# Patient Record
Sex: Male | Born: 1950 | Race: White | Hispanic: No | Smoking: Current every day smoker
Health system: Southern US, Community
[De-identification: ages and names within clinical notes are randomized; demographics above are authoritative.]

## PROBLEM LIST (undated history)

## (undated) DIAGNOSIS — R609 Edema, unspecified: Secondary | ICD-10-CM

## (undated) DIAGNOSIS — G8929 Other chronic pain: Secondary | ICD-10-CM

## (undated) DIAGNOSIS — M4800 Spinal stenosis, site unspecified: Secondary | ICD-10-CM

## (undated) DIAGNOSIS — E7800 Pure hypercholesterolemia, unspecified: Secondary | ICD-10-CM

## (undated) DIAGNOSIS — D509 Iron deficiency anemia, unspecified: Secondary | ICD-10-CM

## (undated) DIAGNOSIS — U071 COVID-19: Secondary | ICD-10-CM

## (undated) DIAGNOSIS — I5030 Unspecified diastolic (congestive) heart failure: Secondary | ICD-10-CM

## (undated) DIAGNOSIS — E1142 Type 2 diabetes mellitus with diabetic polyneuropathy: Secondary | ICD-10-CM

## (undated) DIAGNOSIS — K219 Gastro-esophageal reflux disease without esophagitis: Secondary | ICD-10-CM

## (undated) DIAGNOSIS — N289 Disorder of kidney and ureter, unspecified: Secondary | ICD-10-CM

## (undated) DIAGNOSIS — I739 Peripheral vascular disease, unspecified: Secondary | ICD-10-CM

## (undated) DIAGNOSIS — F329 Major depressive disorder, single episode, unspecified: Secondary | ICD-10-CM

## (undated) DIAGNOSIS — F32A Depression, unspecified: Secondary | ICD-10-CM

## (undated) DIAGNOSIS — R251 Tremor, unspecified: Secondary | ICD-10-CM

## (undated) DIAGNOSIS — I10 Essential (primary) hypertension: Secondary | ICD-10-CM

## (undated) DIAGNOSIS — J449 Chronic obstructive pulmonary disease, unspecified: Secondary | ICD-10-CM

## (undated) DIAGNOSIS — F419 Anxiety disorder, unspecified: Secondary | ICD-10-CM

## (undated) DIAGNOSIS — E119 Type 2 diabetes mellitus without complications: Secondary | ICD-10-CM

## (undated) DIAGNOSIS — R600 Localized edema: Secondary | ICD-10-CM

## (undated) DIAGNOSIS — K264 Chronic or unspecified duodenal ulcer with hemorrhage: Secondary | ICD-10-CM

## (undated) DIAGNOSIS — I639 Cerebral infarction, unspecified: Secondary | ICD-10-CM

## (undated) DIAGNOSIS — D649 Anemia, unspecified: Secondary | ICD-10-CM

## (undated) DIAGNOSIS — I82409 Acute embolism and thrombosis of unspecified deep veins of unspecified lower extremity: Secondary | ICD-10-CM

## (undated) DIAGNOSIS — M869 Osteomyelitis, unspecified: Secondary | ICD-10-CM

## (undated) HISTORY — PX: ANKLE SURGERY: SHX546

## (undated) HISTORY — DX: Major depressive disorder, single episode, unspecified: F32.9

## (undated) HISTORY — DX: Depression, unspecified: F32.A

---

## 2000-11-29 ENCOUNTER — Encounter: Payer: Self-pay | Admitting: Internal Medicine

## 2000-11-29 ENCOUNTER — Ambulatory Visit (HOSPITAL_COMMUNITY): Admission: RE | Admit: 2000-11-29 | Discharge: 2000-11-29 | Payer: Self-pay | Admitting: Internal Medicine

## 2002-04-13 ENCOUNTER — Encounter: Payer: Self-pay | Admitting: Emergency Medicine

## 2002-04-13 ENCOUNTER — Emergency Department (HOSPITAL_COMMUNITY): Admission: EM | Admit: 2002-04-13 | Discharge: 2002-04-13 | Payer: Self-pay | Admitting: Emergency Medicine

## 2003-05-21 ENCOUNTER — Emergency Department (HOSPITAL_COMMUNITY): Admission: EM | Admit: 2003-05-21 | Discharge: 2003-05-21 | Payer: Self-pay | Admitting: Emergency Medicine

## 2003-05-22 ENCOUNTER — Encounter (HOSPITAL_COMMUNITY): Admission: RE | Admit: 2003-05-22 | Discharge: 2003-06-21 | Payer: Self-pay | Admitting: Internal Medicine

## 2003-12-13 ENCOUNTER — Ambulatory Visit (HOSPITAL_COMMUNITY): Admission: RE | Admit: 2003-12-13 | Discharge: 2003-12-13 | Payer: Self-pay | Admitting: Internal Medicine

## 2004-01-05 ENCOUNTER — Observation Stay (HOSPITAL_COMMUNITY): Admission: RE | Admit: 2004-01-05 | Discharge: 2004-01-06 | Payer: Self-pay | Admitting: General Surgery

## 2004-01-05 HISTORY — PX: HERNIA REPAIR: SHX51

## 2004-01-24 ENCOUNTER — Ambulatory Visit (HOSPITAL_COMMUNITY): Admission: RE | Admit: 2004-01-24 | Discharge: 2004-01-24 | Payer: Self-pay | Admitting: Internal Medicine

## 2006-01-07 ENCOUNTER — Emergency Department (HOSPITAL_COMMUNITY): Admission: EM | Admit: 2006-01-07 | Discharge: 2006-01-07 | Payer: Self-pay | Admitting: Emergency Medicine

## 2007-11-04 ENCOUNTER — Emergency Department (HOSPITAL_COMMUNITY): Admission: EM | Admit: 2007-11-04 | Discharge: 2007-11-04 | Payer: Self-pay | Admitting: Emergency Medicine

## 2008-12-18 ENCOUNTER — Emergency Department (HOSPITAL_COMMUNITY): Admission: EM | Admit: 2008-12-18 | Discharge: 2008-12-18 | Payer: Self-pay | Admitting: Emergency Medicine

## 2009-08-24 ENCOUNTER — Emergency Department (HOSPITAL_COMMUNITY): Admission: EM | Admit: 2009-08-24 | Discharge: 2009-08-24 | Payer: Self-pay | Admitting: Emergency Medicine

## 2010-07-20 ENCOUNTER — Emergency Department (HOSPITAL_COMMUNITY)
Admission: EM | Admit: 2010-07-20 | Discharge: 2010-07-20 | Disposition: A | Discharge status: Home / Self Care | Payer: Medicare Other | Attending: Emergency Medicine | Admitting: Emergency Medicine

## 2010-07-20 ENCOUNTER — Encounter (HOSPITAL_COMMUNITY): Payer: Self-pay | Admitting: Radiology

## 2010-07-20 ENCOUNTER — Emergency Department (HOSPITAL_COMMUNITY): Payer: Medicare Other

## 2010-07-20 DIAGNOSIS — Z79899 Other long term (current) drug therapy: Secondary | ICD-10-CM | POA: Insufficient documentation

## 2010-07-20 DIAGNOSIS — R109 Unspecified abdominal pain: Secondary | ICD-10-CM | POA: Insufficient documentation

## 2010-07-20 DIAGNOSIS — I10 Essential (primary) hypertension: Secondary | ICD-10-CM | POA: Insufficient documentation

## 2010-07-20 DIAGNOSIS — Z87442 Personal history of urinary calculi: Secondary | ICD-10-CM | POA: Insufficient documentation

## 2010-07-20 DIAGNOSIS — R911 Solitary pulmonary nodule: Secondary | ICD-10-CM | POA: Insufficient documentation

## 2010-07-20 DIAGNOSIS — E119 Type 2 diabetes mellitus without complications: Secondary | ICD-10-CM | POA: Insufficient documentation

## 2010-07-20 HISTORY — DX: Essential (primary) hypertension: I10

## 2010-07-20 LAB — COMPREHENSIVE METABOLIC PANEL
ALT: 9 U/L (ref 0–53)
AST: 14 U/L (ref 0–37)
Alkaline Phosphatase: 59 U/L (ref 39–117)
CO2: 29 mEq/L (ref 19–32)
Calcium: 8.4 mg/dL (ref 8.4–10.5)
GFR calc Af Amer: >60 mL/min (ref >60)
GFR calc non Af Amer: >60 mL/min (ref >60)
Potassium: 3.2 mEq/L — ABNORMAL LOW (ref 3.5–5.1)
Sodium: 142 mEq/L (ref 135–145)

## 2010-07-20 LAB — CBC
HCT: 40.4 % (ref 39.0–52.0)
Hemoglobin: 13.6 g/dL (ref 13.0–17.0)
MCHC: 33.7 g/dL (ref 30.0–36.0)
RBC: 4.24 MIL/uL (ref 4.22–5.81)
WBC: 9.1 10*3/uL (ref 4.0–10.5)

## 2010-07-20 LAB — DIFFERENTIAL
Basophils Absolute: 0 10*3/uL (ref 0.0–0.1)
Basophils Relative: 0 % (ref 0–1)
Lymphocytes Relative: 30 % (ref 12–46)
Monocytes Absolute: 0.4 10*3/uL (ref 0.1–1.0)
Neutro Abs: 5.9 10*3/uL (ref 1.7–7.7)
Neutrophils Relative %: 64 % (ref 43–77)

## 2010-07-20 LAB — GLUCOSE, CAPILLARY: Glucose-Capillary: 53 mg/dL — ABNORMAL LOW (ref 70–99)

## 2010-07-20 LAB — URINALYSIS, ROUTINE W REFLEX MICROSCOPIC
Bilirubin Urine: NEGATIVE
Hgb urine dipstick: NEGATIVE
Specific Gravity, Urine: >1.03 — ABNORMAL HIGH (ref 1.005–1.030)
Urine Glucose, Fasting: NEGATIVE mg/dL
pH: 5.5 (ref 5.0–8.0)

## 2010-07-20 MED ORDER — IOHEXOL 300 MG/ML  SOLN
100.0000 mL | Freq: Once | INTRAMUSCULAR | Status: AC | PRN
  Administered 2010-07-20: 100 mL via INTRAVENOUS

## 2010-08-09 ENCOUNTER — Ambulatory Visit (HOSPITAL_COMMUNITY)
Admission: RE | Admit: 2010-08-09 | Discharge: 2010-08-09 | Disposition: A | Discharge status: Home / Self Care | Payer: Medicare Other | Source: Ambulatory Visit | Attending: Urology | Admitting: Urology

## 2010-08-09 ENCOUNTER — Ambulatory Visit (HOSPITAL_COMMUNITY): Payer: Medicare Other

## 2010-08-09 DIAGNOSIS — Z79899 Other long term (current) drug therapy: Secondary | ICD-10-CM | POA: Insufficient documentation

## 2010-08-09 DIAGNOSIS — I10 Essential (primary) hypertension: Secondary | ICD-10-CM | POA: Insufficient documentation

## 2010-08-09 DIAGNOSIS — E119 Type 2 diabetes mellitus without complications: Secondary | ICD-10-CM | POA: Insufficient documentation

## 2010-08-09 DIAGNOSIS — N135 Crossing vessel and stricture of ureter without hydronephrosis: Secondary | ICD-10-CM

## 2010-08-09 LAB — GLUCOSE, CAPILLARY

## 2010-08-10 LAB — GLUCOSE, CAPILLARY: Glucose-Capillary: 85 mg/dL (ref 70–99)

## 2010-08-10 LAB — POCT I-STAT 4, (NA,K, GLUC, HGB,HCT)
Potassium: 4 mEq/L (ref 3.5–5.1)
Sodium: 144 mEq/L (ref 135–145)

## 2010-08-11 NOTE — Op Note (Signed)
  NAME:  Eric Scott, Eric Scott                ACCOUNT NO.:  [id number]  MEDICAL RECORD NO.:  [id number]           PATIENT TYPE:  O  LOCATION:  DAYP                          FACILITY:  [facility]  PHYSICIAN:  Marissa Nestle, M.D.DATE OF BIRTH:  [birth year]  DATE OF PROCEDURE: DATE OF DISCHARGE:                              OPERATIVE REPORT   PREOPERATIVE DIAGNOSIS:  Rule out left ureteral dilatation, possible stricture or stone.  POSTOPERATIVE DIAGNOSIS:  Normal left retrograde pyelogram.  ANESTHESIA:  General.  PROCEDURE:  The patient is having considerable pain in the left flank, which was suspecting like a renal colic.  CT scan shows segmented dilation of the ureter on the left side, suspicion of a stricture. Patient has a history of having kidney stones in the past, but this CT does not show any kidney stone.  He was given general endotracheal anesthesia in lithotomy position, usual prep and drape, #25 cystoscope introduced into the bladder.  Bladder looks normal.  Left ureteral orifice catheterized with a wedge catheter.  Hypaque is injected under fluoroscopic control.  The dye goes up into the renal pelvis.  The entire ureter looks normal.  There is just a slight dilation of ureter at the crossing of the common iliac vessel, but there is no obstruction. Dye goes through that area very easily.  The renal pelvis is not dilated.  The calyces are club shaped.  I do not see any filling defect in the ureter or the renal pelvis, and at this point the cystoscope was removed and the patient left the operating room in satisfactory condition.     Marissa Nestle, M.D.     MIJ/MEDQ  D:  08/09/2010  T:  08/10/2010  Job:  [id number]  Electronically Signed by Beaulah Dinning M.D. on 08/10/2010 02:15:44 PM

## 2010-08-25 LAB — URIC ACID: Uric Acid, Serum: 11.5 mg/dL — ABNORMAL HIGH (ref 4.0–7.8)

## 2010-08-25 LAB — POCT CARDIAC MARKERS
CKMB, poc: <1 ng/mL — ABNORMAL LOW (ref 1.0–8.0)
Myoglobin, poc: 99.1 ng/mL (ref 12–200)

## 2010-10-20 NOTE — Op Note (Signed)
Eric Scott, Eric Scott                          ACCOUNT NO.:  [id number]   MEDICAL RECORD NO.:  [id number]                   PATIENT TYPE:  AMB   LOCATION:  DAY                                  FACILITY:  [facility]   PHYSICIAN:  Felicie Morn, M.D.              DATE OF BIRTH:  Aug 14, 1950   DATE OF PROCEDURE:  01/05/2004  DATE OF DISCHARGE:                                 OPERATIVE REPORT   SURGEON:  Felicie Morn, M.D.   PREOPERATIVE DIAGNOSIS:  Umbilical hernia (supraumbilical hernia).   POSTOPERATIVE DIAGNOSIS:  Umbilical hernia (supraumbilical hernia).   PROCEDURE:  Umbilical hernia repair.   SPECIMENS:  Incarcerated omentum with hernia sac.   INDICATIONS FOR PROCEDURE:  This is a 60 year old white male who had  increasing periumbilical discomfort who was noted clinically to have a mass  just above his umbilicus.  He was admitted for umbilical hernia repair.  We  discussed the procedure with him in detail, including complications not  limited to but including bleeding, infection and recurrence, and informed  consent was obtained.  We also discussed the possibility of using mesh  should this be necessary.   GROSS OPERATIVE FINDINGS:  The patient had a probably silver dollar-size  defect.  I did not feel the need for mesh was necessary.  He had  incarcerated omentum within this, a portion of which was removed and sent as  specimen and the other portion was reduced.   TECHNIQUE:  The patient was placed in the supine position.  After the  adequate administration of LMA anesthesia, his entire abdomen was prepped  with Betadine solution and draped in the usual manner.   A transverse incision carried out over the palpable mass in the  supraumbilical area.  The skin and subcutaneous tissue was excised down to  the fascia where there was the obvious defect.  The hernia sac was opened.  There were some adhesions about the incarcerated omentum.  These were  ligated and divided, and  the rest of the omentum was returned to the  abdominal cavity.  We then closed the fascial defect in a transverse manner  using pants-over-vest type of suture in a two layer closure using #1 Prolene  and then running 0 Prolene suture for the second layer.  We used  approximately 10 cc of 0.5% Sensorcaine to add to postoperative discomfort.  The wound was then irrigated.  The subcutaneous tissue was closed with a 3-0  Polysorb over this, and the skin was approximated with the stapling  device.  Prior to closure, all sponge, needle, and instrument counts were  found to be correct.  Estimated blood loss was minimal.  The patient  received 1100 cc of crystalloid intraoperatively.  There were no drains, and  there were no complications.      ___________________________________________  Felicie Morn, M.D.   WB/MEDQ  D:  01/05/2004  T:  01/05/2004  Job:  [id number]   cc:   Sherrilee Gilles. Gerarda Fraction, M.D.  [address] [place] [address] [id number]  Fax: [phone]

## 2010-11-05 ENCOUNTER — Emergency Department (HOSPITAL_COMMUNITY)
Admission: EM | Admit: 2010-11-05 | Discharge: 2010-11-05 | Disposition: A | Discharge status: Home / Self Care | Payer: Medicare Other | Attending: Emergency Medicine | Admitting: Emergency Medicine

## 2010-11-05 ENCOUNTER — Emergency Department (HOSPITAL_COMMUNITY): Payer: Medicare Other

## 2010-11-05 DIAGNOSIS — Z79899 Other long term (current) drug therapy: Secondary | ICD-10-CM | POA: Insufficient documentation

## 2010-11-05 DIAGNOSIS — M79609 Pain in unspecified limb: Secondary | ICD-10-CM | POA: Insufficient documentation

## 2010-11-05 DIAGNOSIS — E1169 Type 2 diabetes mellitus with other specified complication: Secondary | ICD-10-CM | POA: Insufficient documentation

## 2010-11-05 DIAGNOSIS — L97509 Non-pressure chronic ulcer of other part of unspecified foot with unspecified severity: Secondary | ICD-10-CM | POA: Insufficient documentation

## 2010-11-05 DIAGNOSIS — I10 Essential (primary) hypertension: Secondary | ICD-10-CM | POA: Insufficient documentation

## 2010-11-05 DIAGNOSIS — R1032 Left lower quadrant pain: Secondary | ICD-10-CM | POA: Insufficient documentation

## 2010-11-05 LAB — CBC
Platelets: 225 10*3/uL (ref 150–400)
RBC: 4.27 MIL/uL (ref 4.22–5.81)
WBC: 11.3 10*3/uL — ABNORMAL HIGH (ref 4.0–10.5)

## 2010-11-05 LAB — URINALYSIS, ROUTINE W REFLEX MICROSCOPIC
Glucose, UA: NEGATIVE mg/dL
Protein, ur: NEGATIVE mg/dL
pH: 5 (ref 5.0–8.0)

## 2010-11-05 LAB — URINE MICROSCOPIC-ADD ON

## 2010-11-05 LAB — BASIC METABOLIC PANEL
Chloride: 102 mEq/L (ref 96–112)
Creatinine, Ser: 1.14 mg/dL (ref 0.4–1.5)
GFR calc Af Amer: >60 mL/min (ref >60)
Potassium: 3.4 mEq/L — ABNORMAL LOW (ref 3.5–5.1)
Sodium: 138 mEq/L (ref 135–145)

## 2010-11-05 LAB — GLUCOSE, CAPILLARY: Glucose-Capillary: 65 mg/dL — ABNORMAL LOW (ref 70–99)

## 2010-11-05 LAB — DIFFERENTIAL
Basophils Absolute: 0 10*3/uL (ref 0.0–0.1)
Basophils Relative: 0 % (ref 0–1)
Eosinophils Absolute: 0.2 10*3/uL (ref 0.0–0.7)
Neutrophils Relative %: 68 % (ref 43–77)

## 2010-11-06 NOTE — Consult Note (Signed)
NAME:  Eric Scott, Eric Scott                ACCOUNT NO.:  [id number]  MEDICAL RECORD NO.:  [id number]           PATIENT TYPE:  E  LOCATION:  [facility]                          FACILITY:  [facility]  PHYSICIAN:  Doree Albee, M.D.DATE OF BIRTH:  03/01/1951  DATE OF CONSULTATION:  11/05/2010 DATE OF DISCHARGE:                                CONSULTATION   REFERRING PHYSICIAN:  Ripley Fraise, MD ER physician.  REASON FOR CONSULTATION:  Right plantar foot ulcer.  HISTORY OF PRESENT ILLNESS:  This 59 year old diabetic gentleman presents with a 3-week history of ulceration of the plantar aspect of the right foot in the region of the first metatarsal area.  He says that he has been treating this at home with peroxide and it seems to have improved and also he had seen his primary care physician, Dr. Blanch Media 4 days ago and was prescribed ciprofloxacin.  He has had no fever and there is certainly no redness spreading from the site of the wound.  He denies any trauma to the area, but he does have peripheral neuropathy and has poor sensation in both his feet due to his diabetes.  He was seen in the emergency room by Dr. Christy Gentles who asked me to see the patient for an opinion regarding admission for further treatment.  PAST MEDICAL HISTORY:  Significant for diabetes and diabetic neuropathy, hypertension, chronic back pain, and recent history of left groin pain for which he has had a normal left retrograde pyelogram on August 14, 2010, and August 10, 2010, by Dr. Michela Pitcher.  ALLERGIES:  FLEXERIL.  MEDICATIONS: 1. Ciprofloxacin 750 mg b.i.d. 2. Aspirin 81 mg daily. 3. Metformin 1000 mg b.i.d. 4. Amaryl 2 mg b.i.d. 5. HCTZ 25 mg daily. 6. Lisinopril 20 mg daily. 7. Allopurinol 300 mg daily. 8. Valium 10 mg t.i.d. 9. Vicodin 10/650 one t.i.d.  SOCIAL HISTORY:  The patient is married and lives with his wife.  He smokes cigarettes.  He does not drink alcohol.  FAMILY HISTORY:  Positive for diabetes and  hypertension.  REVIEW OF SYSTEMS:  Apart from the complained on the right foot, he complains of left back pain which is chronic.  PHYSICAL EXAMINATION:  VITAL SIGNS:  Temperature 98.3, blood pressure 125/73, pulse 80, respiratory rate 12, saturation 99%. GENERAL:  He looks systemically well and is nontoxic whatsoever. EXTREMITIES:  Examination of the right foot shows approximately 1 cm wound in the plantar aspect of the right foot.  I cannot feel any fluctuance and it is not tender whatsoever in this area underneath and I have pressed quite hard in the underlying bones and he is not tender at all.  There is no surrounding cellulitis.  Otherwise systemically he looks well and there is no increased work of breathing.  He does not look septic.  INVESTIGATIONS:  Hemoglobin 14.1, white blood cell count marginally raised at 11.3, platelets 225.  Sodium 138, potassium 3.4, bicarbonate 29, glucose 70, BUN 20, creatinine 1.14.  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  IMPRESSION:  1. Right foot diabetic ulcer with infection. 2. Diabetes.  PLAN:  I do not feel that this patient needs to be admitted to the hospital right now.  He can be treated with a combination of ciprofloxacin and perhaps another agent to broadly cover other organisms.  More importantly, I think he needs to be followed up in a Wound Clinic.  I believe there is one at [facility].  I am not sure an MRI of his right foot is warranted in view of the clinical findings that do not really support osteomyelitis at this stage.  I suppose if things do not improve, then an MRI would definitely be considered.  I have discussed the patient with Dr. Christy Gentles and he will proceed further with management and I have stressed to the patient and to Dr. Christy Gentles the importance of followup in a Wound  Clinic.     Doree Albee, M.D.     NCG/MEDQ  D:  11/05/2010  T:  11/05/2010  Job:  [id number]  Electronically Signed by Hurshel Party M.D. on 11/06/2010 12:05:27 PM

## 2010-11-07 LAB — URINE CULTURE: Culture: NO GROWTH

## 2012-05-04 DIAGNOSIS — I639 Cerebral infarction, unspecified: Secondary | ICD-10-CM

## 2012-05-04 HISTORY — DX: Cerebral infarction, unspecified: I63.9

## 2012-05-27 ENCOUNTER — Emergency Department (HOSPITAL_COMMUNITY): Payer: Medicare Other

## 2012-05-27 ENCOUNTER — Inpatient Hospital Stay (HOSPITAL_COMMUNITY)
Admission: EM | Admit: 2012-05-27 | Discharge: 2012-05-30 | DRG: 065 | Disposition: A | Discharge status: Rehabilitation Facility | Payer: Medicare Other | Attending: Internal Medicine | Admitting: Internal Medicine

## 2012-05-27 ENCOUNTER — Encounter (HOSPITAL_COMMUNITY): Payer: Self-pay

## 2012-05-27 ENCOUNTER — Inpatient Hospital Stay (HOSPITAL_COMMUNITY): Payer: Medicare Other

## 2012-05-27 DIAGNOSIS — I6350 Cerebral infarction due to unspecified occlusion or stenosis of unspecified cerebral artery: Principal | ICD-10-CM | POA: Diagnosis present

## 2012-05-27 DIAGNOSIS — I639 Cerebral infarction, unspecified: Secondary | ICD-10-CM

## 2012-05-27 DIAGNOSIS — Z23 Encounter for immunization: Secondary | ICD-10-CM

## 2012-05-27 DIAGNOSIS — G8929 Other chronic pain: Secondary | ICD-10-CM | POA: Diagnosis present

## 2012-05-27 DIAGNOSIS — S91109A Unspecified open wound of unspecified toe(s) without damage to nail, initial encounter: Secondary | ICD-10-CM | POA: Diagnosis present

## 2012-05-27 DIAGNOSIS — F411 Generalized anxiety disorder: Secondary | ICD-10-CM | POA: Diagnosis present

## 2012-05-27 DIAGNOSIS — I6381 Other cerebral infarction due to occlusion or stenosis of small artery: Secondary | ICD-10-CM | POA: Diagnosis present

## 2012-05-27 DIAGNOSIS — F17200 Nicotine dependence, unspecified, uncomplicated: Secondary | ICD-10-CM | POA: Diagnosis present

## 2012-05-27 DIAGNOSIS — Z8673 Personal history of transient ischemic attack (TIA), and cerebral infarction without residual deficits: Secondary | ICD-10-CM

## 2012-05-27 DIAGNOSIS — R29810 Facial weakness: Secondary | ICD-10-CM | POA: Diagnosis present

## 2012-05-27 DIAGNOSIS — E1122 Type 2 diabetes mellitus with diabetic chronic kidney disease: Secondary | ICD-10-CM | POA: Diagnosis present

## 2012-05-27 DIAGNOSIS — R404 Transient alteration of awareness: Secondary | ICD-10-CM | POA: Diagnosis not present

## 2012-05-27 DIAGNOSIS — R410 Disorientation, unspecified: Secondary | ICD-10-CM | POA: Diagnosis not present

## 2012-05-27 DIAGNOSIS — R32 Unspecified urinary incontinence: Secondary | ICD-10-CM | POA: Diagnosis present

## 2012-05-27 DIAGNOSIS — E7800 Pure hypercholesterolemia, unspecified: Secondary | ICD-10-CM | POA: Diagnosis present

## 2012-05-27 DIAGNOSIS — I10 Essential (primary) hypertension: Secondary | ICD-10-CM | POA: Diagnosis present

## 2012-05-27 DIAGNOSIS — A0472 Enterocolitis due to Clostridium difficile, not specified as recurrent: Secondary | ICD-10-CM | POA: Diagnosis present

## 2012-05-27 DIAGNOSIS — E876 Hypokalemia: Secondary | ICD-10-CM

## 2012-05-27 DIAGNOSIS — R159 Full incontinence of feces: Secondary | ICD-10-CM | POA: Diagnosis present

## 2012-05-27 DIAGNOSIS — S91119A Laceration without foreign body of unspecified toe without damage to nail, initial encounter: Secondary | ICD-10-CM | POA: Diagnosis present

## 2012-05-27 DIAGNOSIS — X58XXXA Exposure to other specified factors, initial encounter: Secondary | ICD-10-CM | POA: Diagnosis present

## 2012-05-27 DIAGNOSIS — E119 Type 2 diabetes mellitus without complications: Secondary | ICD-10-CM

## 2012-05-27 DIAGNOSIS — E1142 Type 2 diabetes mellitus with diabetic polyneuropathy: Secondary | ICD-10-CM | POA: Diagnosis present

## 2012-05-27 DIAGNOSIS — E1149 Type 2 diabetes mellitus with other diabetic neurological complication: Secondary | ICD-10-CM | POA: Diagnosis present

## 2012-05-27 HISTORY — DX: Pure hypercholesterolemia, unspecified: E78.00

## 2012-05-27 HISTORY — DX: Type 2 diabetes mellitus without complications: E11.9

## 2012-05-27 HISTORY — DX: Spinal stenosis, site unspecified: M48.00

## 2012-05-27 HISTORY — DX: Anxiety disorder, unspecified: F41.9

## 2012-05-27 HISTORY — DX: Other chronic pain: G89.29

## 2012-05-27 HISTORY — DX: Type 2 diabetes mellitus with diabetic polyneuropathy: E11.42

## 2012-05-27 HISTORY — DX: Cerebral infarction, unspecified: I63.9

## 2012-05-27 LAB — CBC WITH DIFFERENTIAL/PLATELET
Eosinophils Relative: 1 % (ref 0–5)
HCT: 47.5 % (ref 39.0–52.0)
Hemoglobin: 17 g/dL (ref 13.0–17.0)
Lymphocytes Relative: 17 % (ref 12–46)
Lymphs Abs: 1.7 10*3/uL (ref 0.7–4.0)
MCV: 97.9 fL (ref 78.0–100.0)
Monocytes Absolute: 0.5 10*3/uL (ref 0.1–1.0)
Monocytes Relative: 5 % (ref 3–12)
Neutro Abs: 8 10*3/uL — ABNORMAL HIGH (ref 1.7–7.7)
RBC: 4.85 MIL/uL (ref 4.22–5.81)
WBC: 10.5 10*3/uL (ref 4.0–10.5)

## 2012-05-27 LAB — POCT I-STAT TROPONIN I: Troponin i, poc: 0 ng/mL (ref 0.00–0.08)

## 2012-05-27 LAB — GLUCOSE, CAPILLARY: Glucose-Capillary: 128 mg/dL — ABNORMAL HIGH (ref 70–99)

## 2012-05-27 LAB — APTT: aPTT: 33 seconds (ref 24–37)

## 2012-05-27 LAB — COMPREHENSIVE METABOLIC PANEL
Albumin: 3.4 g/dL — ABNORMAL LOW (ref 3.5–5.2)
Alkaline Phosphatase: 111 U/L (ref 39–117)
BUN: 9 mg/dL (ref 6–23)
Calcium: 9.1 mg/dL (ref 8.4–10.5)
Creatinine, Ser: 0.85 mg/dL (ref 0.50–1.35)
Potassium: 3.3 mEq/L — ABNORMAL LOW (ref 3.5–5.1)
Total Protein: 7 g/dL (ref 6.0–8.3)

## 2012-05-27 LAB — HEMOGLOBIN A1C
Hgb A1c MFr Bld: 5.2 % (ref <5.7)
Mean Plasma Glucose: 103 mg/dL (ref <117)

## 2012-05-27 MED ORDER — ASPIRIN 300 MG RE SUPP
300.0000 mg | Freq: Every day | RECTAL | Status: DC
  Filled 2012-05-27 (×4): qty 1

## 2012-05-27 MED ORDER — ONDANSETRON HCL 4 MG/2ML IJ SOLN: 4.0000 mg | Freq: Four times a day (QID) | INTRAMUSCULAR | Status: DC | PRN

## 2012-05-27 MED ORDER — INFLUENZA VIRUS VACC SPLIT PF IM SUSP
0.5000 mL | INTRAMUSCULAR | Status: AC
  Administered 2012-05-28: 0.5 mL via INTRAMUSCULAR
  Filled 2012-05-27: qty 0.5

## 2012-05-27 MED ORDER — INSULIN ASPART 100 UNIT/ML ~~LOC~~ SOLN: 0.0000 [IU] | Freq: Every day | SUBCUTANEOUS | Status: DC

## 2012-05-27 MED ORDER — NICOTINE 21 MG/24HR TD PT24
21.0000 mg | MEDICATED_PATCH | Freq: Every day | TRANSDERMAL | Status: DC
  Administered 2012-05-27 – 2012-05-30 (×4): 21 mg via TRANSDERMAL
  Filled 2012-05-27 (×6): qty 1

## 2012-05-27 MED ORDER — INSULIN ASPART 100 UNIT/ML ~~LOC~~ SOLN
0.0000 [IU] | Freq: Three times a day (TID) | SUBCUTANEOUS | Status: DC
  Administered 2012-05-27: 1 [IU] via SUBCUTANEOUS
  Administered 2012-05-28: 2 [IU] via SUBCUTANEOUS

## 2012-05-27 MED ORDER — ACETAMINOPHEN 500 MG PO TABS
500.0000 mg | ORAL_TABLET | Freq: Four times a day (QID) | ORAL | Status: DC | PRN
  Filled 2012-05-27: qty 1

## 2012-05-27 MED ORDER — ASPIRIN 325 MG PO TABS
325.0000 mg | ORAL_TABLET | Freq: Every day | ORAL | Status: DC
  Administered 2012-05-27 – 2012-05-30 (×4): 325 mg via ORAL
  Filled 2012-05-27 (×4): qty 1

## 2012-05-27 MED ORDER — ENOXAPARIN SODIUM 40 MG/0.4ML ~~LOC~~ SOLN
40.0000 mg | SUBCUTANEOUS | Status: DC
  Administered 2012-05-27 – 2012-05-30 (×4): 40 mg via SUBCUTANEOUS
  Filled 2012-05-27 (×5): qty 0.4

## 2012-05-27 MED ORDER — POTASSIUM CHLORIDE CRYS ER 20 MEQ PO TBCR
40.0000 meq | EXTENDED_RELEASE_TABLET | Freq: Two times a day (BID) | ORAL | Status: AC
  Administered 2012-05-27 – 2012-05-28 (×2): 40 meq via ORAL
  Filled 2012-05-27 (×3): qty 2

## 2012-05-27 MED ORDER — SODIUM CHLORIDE 0.9 % IV SOLN
INTRAVENOUS | Status: DC
  Administered 2012-05-27: 14:00:00 via INTRAVENOUS

## 2012-05-27 NOTE — H&P (Signed)
Internal Medicine Teaching Service Attending Note Date: 05/27/2012  Patient name: Eric Scott  Medical record number: [id number]  Date of birth: 1950/08/22   I have seen and evaluated Eric Scott and discussed their care with the Residency Team. Please see Dr Serita Grit H&P for full details. Eric Scott was in usual state of health until Sat when he experienced sudden onset, while walking, of leg pain and legs not able to support him. He denies falling but did have loss of bowel and bladder. He also had onset of R arm weakness. Since then, he has been in bed. He states that his roommate has lifted and carried him to bathroom and has also been cooking for him. He has chronic B foot pain but that the leg pain was transient and has resolved. He also states that the bowel and bladder incontinence is also chronic - for years - but that he has never mentioned this his his doctor. He does not have to use a diaper and hasn't interfered with his ability to do things and get out of house. He has had no recent CP, SOB, choking. He has not been on ASA recently.  He is married but they do not live together. We may tell the wife anything regarding his medical condition.  PMHx is sig for  DM II non insulin dep HTN not treated  Meds, allergies, fam hx, soc hx, and ROS were reviewed  Vitals were reviewed. BP 168/80 Gen : alert, able to participate in interview. Possible some confusion - phone vs remote HRRR but with freq arrythmia LCTAB ABD + BS, S/NT/ND HEENT - EOMI, decreased facial / mouth movements on R side of face No carotid bruits R shoulder 0/5 R elbow flex / extension 3+/5 R grip 4/5 LUE 5/5 Unable to lift legs B off bed Ankle flexion / extension 3+/5 B Decreased sensation B up to lower calf, decreased hair growth. +2 DP pulses B R 4th toe abrasion at MCP R plantar callous L big toe abrasion  Labs and imaging were reviewed  Assessment and Plan: I agree with the formulated Assessment and  Plan with the following changes:   1. Subacute ischemic CVA - CT showed lacunar infarcts in the left basal ganglia and posterior limb of the left internal capsule/thalamus which would be c/w his R sided weakness of face, arm, and leg. It doesn't explain the left leg weakness. The triad of reported pain, weakness, and bowel bladder pain could suggest cauda equina syndrome but the incontinence is CHRONIC per pt and the pain was only transient. Therefore, imaging of spinal cord not indicated. Pt will have MRI of brain, ECHO, and carotids. He will be placed on ASA and will have PT/OT consults. His RF inc DM, HTN, and smoking. Unless he makes sig recovery, he will need SNF placement or rehab.   2. DM II non insulin dep - Check A1C. Pre-prandial insulin.  3. HTN - permissive HTN as subacute CVA. Will need BP < 140/90 eventually.  4. Tobacco use - needs to quit  5. Chronic bowel and bladder incontinence - need to observe in hospital to see if pt has true incontinence or urgency.   Dispo - will need a few days to complete W/U and determine appropriate D/C location.   Eric Crews, MD 12/24/20132:14 PM

## 2012-05-27 NOTE — Evaluation (Signed)
Speech Language Pathology Evaluation Patient Details Name: Eric Scott MRN: [id number] DOB: 12-12-1950 Today's Date: 05/27/2012 Time: [id number] SLP Time Calculation (min): 21 min  Problem List:  Patient Active Problem List  Diagnosis  . Acute lacunar stroke  . DM type 2 (diabetes mellitus, type 2)  . Tobacco abuse  . HTN (hypertension)   Past Medical History:  Past Medical History  Diagnosis Date  . Diabetes mellitus   . Hypertension    Past Surgical History:  Past Surgical History  Procedure Date  . Umbilical hernia repair [id number]    Dr. Felicie Morn   HPI:  61 yr old admitted with sudden onset, while walking, of leg pain and legs not able to support him. He denies falling but did have loss of bowel and bladder. He also had onset of R arm weakness.  CT revealed possible subacute left basal ganglis and left thalamic infarct.  PMH:  DM, tobacco abuse, HTN.    Assessment / Plan / Recommendation Clinical Impression  Pt. demonstrated mild-moderate dysarthria characterized by distorted phonemes and mild-moderate cognitive impairments in the areas of attention, working memory, verbal problem solving, and awareness.  Pt. would benefit from ST to facilitate cognitive-communicative abilities.    SLP Assessment  Patient needs continued Speech Lanaguage Pathology Services    Follow Up Recommendations  Inpatient Rehab    Frequency and Duration min 2x/week  2 weeks      SLP Goals  SLP Goals Potential to Achieve Goals: Good SLP Goal #1: Pt. will demonstrate problem solving in verbal and functional scenarios given min verbal/visual assist. SLP Goal #2: Pt. will increase emergent awareness by recognizing speech errors and attempting to correct with mod verbal cues. SLP Goal #3: Pt. will state 2 memory strategies to faciliate working and prospective memory with moderate verbal/visual cues.  SLP Evaluation Prior Functioning  Cognitive/Linguistic Baseline: Information not  available Lives With:  (roommate) Available Help at Discharge: Friend(s) Vocation: On disability   Cognition  Overall Cognitive Status: Impaired Arousal/Alertness: Awake/alert Orientation Level: Oriented X4 Attention: Sustained Sustained Attention: Impaired Sustained Attention Impairment: Verbal basic;Functional basic Memory: Impaired Memory Impairment: Storage deficit;Retrieval deficit;Decreased recall of new information;Decreased short term memory;Prospective memory Awareness: Impaired Awareness Impairment: Anticipatory impairment;Intellectual impairment;Emergent impairment Problem Solving: Impaired Problem Solving Impairment: Verbal basic;Functional basic Executive Function: Self Monitoring;Self Correcting Self Correcting: Impaired Self Correcting Impairment: Verbal basic;Functional basic Safety/Judgment: Impaired    Comprehension  Auditory Comprehension Overall Auditory Comprehension: Appears within functional limits for tasks assessed Yes/No Questions: Within Functional Limits Commands: Within Functional Limits Conversation: Simple EffectiveTechniques: Extra processing time Visual Recognition/Discrimination Discrimination: Not tested Reading Comprehension Reading Status: Not tested    Expression Expression Primary Mode of Expression: Verbal Verbal Expression Overall Verbal Expression: Appears within functional limits for tasks assessed Initiation: No impairment Level of Generative/Spontaneous Verbalization: Conversation Repetition: No impairment Pragmatics:  (informal assessment WFL's) Interfering Components: Attention;Speech Actor Expression Written Expression: Not tested   Oral / Motor Oral Motor/Sensory Function Overall Oral Motor/Sensory Function:  (to be assessed) Motor Speech Overall Motor Speech: Impaired Respiration: Within functional limits Phonation: Normal Resonance: Within functional limits Articulation: Within functional  limitis Intelligibility: Intelligibility reduced Word: 75-100% accurate Phrase: 75-100% accurate Sentence: 75-100% accurate Conversation: 75-100% accurate Motor Planning: Witnin functional limits   GO     Eric Scott M.Ed [employer] [phone]  05/27/2012

## 2012-05-27 NOTE — Progress Notes (Signed)
Rehab Admissions Coordinator Note:  Patient was screened by Cleatrice Burke for appropriateness for an Inpatient Acute Rehab Consult.  At this time, we are recommending Inpatient Rehab consult. I have contacted Dr. Deatra Robinson for the order.  Cleatrice Burke, RN 05/27/2012, 4:10 PM  I can be reached at [phone].

## 2012-05-27 NOTE — ED Notes (Signed)
Patient from home; LSN 12/21 (3 days ago). CC: Right sided weakness. Per EMS, stroke scale positive for right side arm and leg drift as well as right facial droop with smile. Unsteady gait, required assistance to stretcher. Per son, pt AAOx4 but lethargic. VSS. CBG 121.

## 2012-05-27 NOTE — ED Provider Notes (Signed)
History     CSN: [id number]  Arrival date & time 05/27/12  [id number]   First MD Initiated Contact with Patient 05/27/12 0654      No chief complaint on file.   (Consider location/radiation/quality/duration/timing/severity/associated sxs/prior treatment) The history is provided by the patient.   patient presents with right-sided weakness and facial droop since Saturday. Reportedly started during the day. He states it has been chronic since then. He states he also feels weak in both his legs. He states he is diabetic and has some problems with his legs normally. No headache. No confusion. He has not had episodes like this before. No fevers. No cough. No difficulty seen. He does not smoke.  Past Medical History  Diagnosis Date  . Hypertension   . Hypercholesteremia   . Type II diabetes mellitus   . Diabetic peripheral neuropathy     "chronic" (05/27/2012)  . Stroke 05/2012    Subacute, lacunar infarcts within the left basal ganglia and posterior limp of the left internal capsule/thalamus; "RUE; both feet weak" (05/27/2012)  . Anxiety     Past Surgical History  Procedure Date  . Hernia repair 01/05/2004    "belly button" (05/27/2012)    History reviewed. No pertinent family history.  History  Substance Use Topics  . Smoking status: Current Every Day Smoker -- 2.0 packs/day for 45 years    Types: Cigarettes  . Smokeless tobacco: Never Used  . Alcohol Use: No     Comment: 05/27/2012 "drank gallons and gallons 20 yr ago or so; last drink  at least 10 yr ago"      Review of Systems  Constitutional: Negative for activity change and appetite change.  HENT: Negative for neck stiffness.   Eyes: Negative for pain.  Respiratory: Negative for chest tightness and shortness of breath.   Cardiovascular: Negative for chest pain and leg swelling.  Gastrointestinal: Negative for nausea, vomiting, abdominal pain and diarrhea.  Genitourinary: Negative for flank pain.  Musculoskeletal:  Negative for back pain.  Skin: Negative for rash.  Neurological: Positive for weakness. Negative for numbness and headaches.  Psychiatric/Behavioral: Negative for behavioral problems.    Allergies  Flexeril  Home Medications  No current outpatient prescriptions on file.  BP 147/77  Pulse 63  Temp 97.6 F (36.4 C) (Oral)  Resp 18  SpO2 100%  Physical Exam  Nursing note and vitals reviewed. Constitutional: He is oriented to person, place, and time. He appears well-developed and well-nourished.  HENT:  Head: Normocephalic and atraumatic.  Eyes: EOM are normal. Pupils are equal, round, and reactive to light.  Neck: Normal range of motion. Neck supple.  Cardiovascular: Normal rate, regular rhythm and normal heart sounds.   No murmur heard. Pulmonary/Chest: Effort normal and breath sounds normal.  Abdominal: Soft. Bowel sounds are normal. He exhibits no distension and no mass. There is no tenderness. There is no rebound and no guarding.  Musculoskeletal: Normal range of motion. He exhibits no edema.  Neurological: He is alert and oriented to person, place, and time. A cranial nerve deficit is present.       Patient states decreased sensation on left side of face. Mild right facial droop. Visual fields appear intact by confrontation. Decreased strength on right compared to contralateral. There is drift on both arm and leg. Sensation appears grossly intact.  Skin: Skin is warm and dry.  Psychiatric: He has a normal mood and affect.    ED Course  Procedures (including critical care time)  Labs Reviewed  COMPREHENSIVE METABOLIC PANEL - Abnormal; Notable for the following:    Potassium 3.3 (*)     Glucose, Bld 140 (*)     Albumin 3.4 (*)     All other components within normal limits  CBC WITH DIFFERENTIAL - Abnormal; Notable for the following:    MCH 35.1 (*)     Neutro Abs 8.0 (*)     All other components within normal limits  URINE RAPID DRUG SCREEN (HOSP PERFORMED) -  Abnormal; Notable for the following:    Opiates POSITIVE (*)     Benzodiazepines POSITIVE (*)     All other components within normal limits  LIPID PANEL - Abnormal; Notable for the following:    HDL 37 (*)     All other components within normal limits  GLUCOSE, CAPILLARY - Abnormal; Notable for the following:    Glucose-Capillary 128 (*)     All other components within normal limits  GLUCOSE, CAPILLARY - Abnormal; Notable for the following:    Glucose-Capillary 133 (*)     All other components within normal limits  BASIC METABOLIC PANEL - Abnormal; Notable for the following:    Glucose, Bld 148 (*)     All other components within normal limits  GLUCOSE, CAPILLARY - Abnormal; Notable for the following:    Glucose-Capillary 153 (*)     All other components within normal limits  PROTIME-INR  APTT  POCT I-STAT TROPONIN I  HEMOGLOBIN A1C  HEMOGLOBIN A1C  TSH  VITAMIN B12  RPR  HIV ANTIBODY (ROUTINE TESTING)  FOLATE   Dg Chest 2 View  05/27/2012  *RADIOLOGY REPORT*  Clinical Data: Body weakness  CHEST - 2 VIEW  Comparison: None.  Findings: Examination is degraded secondary to patient body habitus.  Enlarged cardiac silhouette and mediastinal contours. There is mild diffuse thickening of the pulmonary interstitium. Mild symmetric bibasilar heterogeneous opacities.  Asymmetric heterogeneous opacities are seen within the right mid lung.  No pleural effusion or pneumothorax.  No acute osseous abnormalities.  IMPRESSION: 1.  Enlarged cardiac silhouette.  Further evaluation of cardiac echo may performed as clinically indicated. 2.  Possible airspace opacity within the right mid lung may represent an area of developing infection.  Continued attention on follow-up is recommended.  3.  Symmetric bibasilar opacities are favored to represent atelectasis.   Original Report Authenticated By: Jake Seats, MD    Ct Head Wo Contrast  05/27/2012  *RADIOLOGY REPORT*  Clinical Data: Right-sided weakness,  unsteady gait, right-sided facial droop, last seen normal 3 days ago.  CT HEAD WITHOUT CONTRAST  Technique:  Contiguous axial images were obtained from the base of the skull through the vertex without contrast.  Comparison: None.  Findings:  There is a geographic slightly ill-defined area of decreased attenuation within the posterior limb of the left internal capsule extending to involve the left thalamus (images 13 and 14).  There is an additional geographic linear area of decreased attenuation within the more anterior aspect of the left basal ganglia (images 11-13).  Grossly symmetric periventricular hypodensities are seen about the occipital horn of the bilateral lateral ventricles compatible with microvascular ischemic disease.  No intraparenchymal or extra-axial mass or hemorrhage.  Normal size and configuration of the ventricles and basilar cisterns.  No midline shift.  There is minimal opacification within the left anterior ethmoidal air cells.  Remaining paranasal sinuses and mastoid air cells are normally aerated.  Regional soft tissues are normal.  No displaced calvarial fracture.  IMPRESSION: 1.  Age indeterminate, likely at least subacute, lacunar infarcts within the left basal ganglia and posterior limb of the left internal capsule/thalamus.  No intraparenchymal or extra-axial hemorrhage.  Further evaluation with MRI may be performed as clinically indicated.  2.  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   Original Report Authenticated By: Jake Seats, MD    Mr Brain Wo Contrast  05/28/2012  *RADIOLOGY REPORT*  Clinical Data:  Stroke.  Several day history of right-sided weakness.  Abnormal CT scan of the head.  MRI HEAD WITHOUT CONTRAST MRA HEAD WITHOUT CONTRAST  Technique:  Multiplanar, multiecho pulse sequences of the brain and surrounding structures were obtained without intravenous contrast. Angiographic images of the head were obtained using MRA technique without contrast.  Comparison:  CT  head without contrast 05/27/2012.  MRI HEAD  Findings:  The diffusion weighted images confirm an acute non hemorrhagic infarct along the posterior limb of the left internal capsule.  There may be some involvement of the left lateral thalamus. A slightly older subacute non hemorrhagic infarct is evident within the anterior left external capsule with some involvement of the caudate head and lentiform nucleus.  Mild generalized atrophy is present.  There is extensive periventricular subcortical white matter change bilaterally.  The perivascular spaces are dilated. A more remote lacunar infarct is present within the left thalamus.  Flow is present in the major intracranial arteries.  The globes and orbits are intact.  Mild mucosal thickening is present in the anterior ethmoid air cells bilaterally.  The paranasal sinuses and mastoid air cells are otherwise clear.  IMPRESSION:  1.  Acute non hemorrhagic infarct involving the posterior limb of the left internal capsule and portions of the lateral thalamus. 2.  Slightly older subacute non hemorrhagic infarct involving the anterior left external capsule with slight involvement of the left caudate head and anterior lentiform nucleus. 3.  Remote lacunar infarct of the left thalamus. 4.  Atrophy and extensive white matter disease.  This likely reflects the sequelae of chronic microvascular ischemia.  MRA HEAD  Findings: The study is mildly degraded by patient motion.  The internal carotid arteries are within normal limits from high cervical segments through the ICA termini bilaterally.  Accounting for motion, the A1 and M1 segments are normal.  There is some attenuation of distal MCA branch vessels bilaterally without a significant proximal stenosis or occlusion.  The vertebral arteries are codominant.  The PICA origins are visualized and within normal limits bilaterally.  The basilar artery is within normal limits.  There is a moderate proximal stenosis of the right P1  segment.  Distal small vessel attenuation is present bilaterally.  IMPRESSION:  1.  Moderate small vessel disease. The acute or subacute infarcts are both within the territory of perforators, compatible with small vessel disease. 2.  Moderate proximal stenosis of the right P1 segment. 3.  No other significant proximal stenosis, aneurysm, or branch vessel occlusion.   Original Report Authenticated By: San Morelle, M.D.    Mr Mra Head/brain Wo Cm  05/28/2012  *RADIOLOGY REPORT*  Clinical Data:  Stroke.  Several day history of right-sided weakness.  Abnormal CT scan of the head.  MRI HEAD WITHOUT CONTRAST MRA HEAD WITHOUT CONTRAST  Technique:  Multiplanar, multiecho pulse sequences of the brain and surrounding structures were obtained without intravenous contrast. Angiographic images of the head were obtained using MRA technique without contrast.  Comparison:  CT head without contrast 05/27/2012.  MRI HEAD  Findings:  The diffusion weighted images confirm an  acute non hemorrhagic infarct along the posterior limb of the left internal capsule.  There may be some involvement of the left lateral thalamus. A slightly older subacute non hemorrhagic infarct is evident within the anterior left external capsule with some involvement of the caudate head and lentiform nucleus.  Mild generalized atrophy is present.  There is extensive periventricular subcortical white matter change bilaterally.  The perivascular spaces are dilated. A more remote lacunar infarct is present within the left thalamus.  Flow is present in the major intracranial arteries.  The globes and orbits are intact.  Mild mucosal thickening is present in the anterior ethmoid air cells bilaterally.  The paranasal sinuses and mastoid air cells are otherwise clear.  IMPRESSION:  1.  Acute non hemorrhagic infarct involving the posterior limb of the left internal capsule and portions of the lateral thalamus. 2.  Slightly older subacute non hemorrhagic  infarct involving the anterior left external capsule with slight involvement of the left caudate head and anterior lentiform nucleus. 3.  Remote lacunar infarct of the left thalamus. 4.  Atrophy and extensive white matter disease.  This likely reflects the sequelae of chronic microvascular ischemia.  MRA HEAD  Findings: The study is mildly degraded by patient motion.  The internal carotid arteries are within normal limits from high cervical segments through the ICA termini bilaterally.  Accounting for motion, the A1 and M1 segments are normal.  There is some attenuation of distal MCA branch vessels bilaterally without a significant proximal stenosis or occlusion.  The vertebral arteries are codominant.  The PICA origins are visualized and within normal limits bilaterally.  The basilar artery is within normal limits.  There is a moderate proximal stenosis of the right P1 segment.  Distal small vessel attenuation is present bilaterally.  IMPRESSION:  1.  Moderate small vessel disease. The acute or subacute infarcts are both within the territory of perforators, compatible with small vessel disease. 2.  Moderate proximal stenosis of the right P1 segment. 3.  No other significant proximal stenosis, aneurysm, or branch vessel occlusion.   Original Report Authenticated By: San Morelle, M.D.      1. Stroke   2. Acute lacunar stroke   3. DM type 2 (diabetes mellitus, type 2)   4. HTN (hypertension)       MDM  Patient with right-sided weakness from couple days previous. Apparent stroke on CT. Patient be admitted to medicine for further workup.         Jasper Riling. Alvino Chapel, MD 05/28/12 [id number]

## 2012-05-27 NOTE — Progress Notes (Signed)
Echocardiogram 2D Echocardiogram has been performed.  Eric Scott 05/27/2012, 3:12 PM

## 2012-05-27 NOTE — Progress Notes (Addendum)
S:  Patient states he feels awful.  His weakness right sided started Saturday evening.  He thought it was because his feet were hurting > legs.  Then he noticed he could not stand.  He decided to come to the hospital because he was not getting better.  He denies dysphagia or slurred speech, chest pain, abdominal pain.  He admits to urinary to bowel incontinence.  He states he has had diarrhea chronically and last had 10:30 this morning.  He reports mild h/a.  He smokes 2 ppd cigarettes and has been smoking since age 62.  He has 1 son living, another son died.  He is separated from his wife.  Denies EtOH, drugs.  He lives with a friend in [place] but is from [place].  He has a 9th grade education.  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  FH: denies.  [facility]: right foot surgery.  His PCP is in [place] and he does not have transportation problems.    Called pharmacy Hydrocodone 10-650 mg Take 1 tablet tid-Dr. Sherrie Sport, Diazepam 10 mg tab tid (both meds filled 05/06/2012); 02/2012 picked up Morphine sulfate 30 mg tab ER bid, Metformin 500 ER qd (07/2011).  Never filled Glimperide 2 mg bid.  No blood pressure meds filled in 1 year ago11/2012 Lisinopril 20 mg qd.    O: 35F, 18, 73, 168/84, 96 % room air  General: lying in bed, nad, alert and oriented to person, location, 05/27/1912, dob HEENT: [place]/at, perrl b/l  CV: RRR no rubs, murmurs or gallops Lungs: ctab Abdomen: soft, obese, ntnd, normal bs  Extremities: no cyanosis or edema.  Right 5 th toe laceration with dried blood, no evidence of drainage.  Left great toe with ecchymosis and dried blood Neuro: +right facial droop, decreased visual field (i.e peripheral vision) on the right, right upper extremity with 4-/5 strength, right lower extremity with 2/5 strength, decreased heel-shin right lower extremity, finger to nose not intact right hand, ?word finding difficulties (though patient denies and states this is his baseline),  otherwise CN 2-12 grossly intact   A/P  1. Subacute, lacunar infarcts within the left basal ganglia and posterior limp of the left internal capsule/thalamus -With deficits right upper arm and right lower leg weakness, slight right facial droop, finger to nose right not intact  -Aspirin 325 mg qd  -PT/OT consulted  -pending US carotid, MRI/A, echo -pending UDS -pending lipid panel, HA1C  2. HTN -Holding antihypertensives for at least 24 more hours in the setting of stroke   3. Right 5th toe laceration -wound care RN  4. DM 2 -pending HA1C, SSI  5. Tobacco abuse -smoking 2 ppd  -Rx Nicotine patch 21 mcg  6. H/a -prn Tylenol.    7. DVT Px  -Lovenox   8. F/E/N -NS 10-20 cc/hr -will monitor labs -cardiac diet   Aundra Dubin MD [phone]

## 2012-05-27 NOTE — Progress Notes (Signed)
UR completed 

## 2012-05-27 NOTE — ED Notes (Signed)
Patient transported to CT 

## 2012-05-27 NOTE — ED Notes (Signed)
Spoke with neighbor Alleen Borne (person who called 61 for pt) he states that he does have contact information for family if needed.  Cell # [phone]  Also try [phone].

## 2012-05-27 NOTE — H&P (Signed)
Hospital Admission Note Date: 05/27/2012  Patient name: Eric Scott Medical record number: [id number] Date of birth: [birth year] Age: 61 y.o. Gender: male PCP: [name], MD  Medical Service: Internal medicine teaching service.  Attending physician: Dr. Lynnae January    1st Contact: Aundra Dubin    Pager: [phone] 2nd Contact: Sharda    Pager: [phone] After 5 pm or weekends: 1st Contact:      Pager: [phone] 2nd Contact:      Pager: [phone]  Chief Complaint: Leg pain and right sided weakness.  History of Present Illness: Patient is a 61 year old man with past history of DM 2 and smoking who comes to the ED with four-day history of worsening leg pain and right-sided weakness. Patient took a shower on Saturday and was okay until he came out and walked to the kitchen. He starts having worsening bilateral leg pain and then felt weak on right side- including right upper and lower extremities. He had trouble walking. He also had tingling and numbness in bilateral hand. He also was incontinent of his bowel and bladder after that.  denies any trouble swallowing. Last meal was yesterday. No fever, chills, nausea, vomiting.  His friend asked him multiple times to come to the hospital but he waited until today. He denies any associated headache, change in vision, palpitation, chest pain, shortness of breath, neck pain, abdominal pain, diarrhea.  He has no similar history before.  He is supposed to take baby aspirin daily but the last dose he took was 2 weeks before.  Meds: Baby aspirin- last dose 2 weeks before. Metformin- unknown dose Oral hypoglycemic- unknown medication. No antihypertensives per patient.  Allergies: Allergies as of 05/27/2012 - Review Complete 05/27/2012  Allergen Reaction Noted  . Flexeril (cyclobenzaprine hcl)  07/20/2010   Past Medical History  Diagnosis Date  . Diabetes mellitus   . Hypertension    History reviewed. No pertinent past surgical history. No family  history on file. History   Social History  . Marital Status: Married    Spouse Name: N/A    Number of Children: N/A  . Years of Education: N/A   Occupational History  . Not on file.   Social History Main Topics  . Smoking status: Current Every Day Smoker -- 1.5 packs/day for 45 years    Types: Cigarettes  . Smokeless tobacco: Not on file  . Alcohol Use: No     Comment: Quit about 10 years before.  . Drug Use: No  . Sexually Active: Not on file   Other Topics Concern  . Not on file   Social History Narrative   Patient lives in [place] with a friend.He is divorced. Has one kid.Not working for about 7 years now. Used to do plumbing work.    Review of Systems: As per HPI.  Physical Exam: Blood pressure 154/87, pulse 76, temperature 98.3 F (36.8 C), temperature source Oral, resp. rate 18, SpO2 95.00%. Constitutional: Vital signs reviewed.  Patient is a well-developed and well-nourished in no acute distress and cooperative with exam. Alert and oriented x3. Mild dysarthria. Head: Normocephalic and atraumatic Mouth: no erythema or exudates, MMM Eyes: PERRL, EOMI, conjunctivae normal, No scleral icterus.  no gross visual field deficit. Neck: Supple, Trachea midline normal ROM, No JVD or carotid bruit present.  Cardiovascular: RRR, S1 normal, S2 normal, no MRG Pulmonary/Chest: CTAB, no wheezes, rales, or rhonchi Abdominal: Soft. Non-tender, non-distended, bowel sounds are normal. Bowel and bladder incontinence  Musculoskeletal: No joint deformities, erythema, or stiffness, ROM  full and no nontender Neurological: A&O x3, decreased motor strength on right upper and lower extremities. Right facial droop and right tongue deviation. Decreased sensation on right side. Pronator drift present on right arm. Decreased reflexes on right side.  Skin: Warm, dry and intact. No rash, cyanosis, or clubbing.  Psychiatric: Normal mood and affect. speech and behavior is normal. Judgment and  thought content normal. Cognition and memory are normal.     Lab results: Basic Metabolic Panel:  [facility] 05/27/12 0648  NA 141  K 3.3*  CL 103  CO2 26  GLUCOSE 140*  BUN 9  CREATININE 0.85  CALCIUM 9.1  MG --  PHOS --   Liver Function Tests:  [facility] 05/27/12 0648  AST 16  ALT 14  ALKPHOS 111  BILITOT 0.6  PROT 7.0  ALBUMIN 3.4*   CBC:  [facility] 05/27/12 0705  WBC 10.5  NEUTROABS 8.0*  HGB 17.0  HCT 47.5  MCV 97.9  PLT 200   Coagulation:  Basename 05/27/12 0705  LABPROT 12.5  INR 0.94    Imaging results:  Dg Chest 2 View  05/27/2012  *RADIOLOGY REPORT*  Clinical Data: Body weakness  CHEST - 2 VIEW  Comparison: None.  Findings: Examination is degraded secondary to patient body habitus.  Enlarged cardiac silhouette and mediastinal contours. There is mild diffuse thickening of the pulmonary interstitium. Mild symmetric bibasilar heterogeneous opacities.  Asymmetric heterogeneous opacities are seen within the right mid lung.  No pleural effusion or pneumothorax.  No acute osseous abnormalities.  IMPRESSION: 1.  Enlarged cardiac silhouette.  Further evaluation of cardiac echo may performed as clinically indicated. 2.  Possible airspace opacity within the right mid lung may represent an area of developing infection.  Continued attention on follow-up is recommended.  3.  Symmetric bibasilar opacities are favored to represent atelectasis.   Original Report Authenticated By: Jake Seats, MD    Ct Head Wo Contrast  05/27/2012  *RADIOLOGY REPORT*  Clinical Data: Right-sided weakness, unsteady gait, right-sided facial droop, last seen normal 3 days ago.  CT HEAD WITHOUT CONTRAST  Technique:  Contiguous axial images were obtained from the base of the skull through the vertex without contrast.  Comparison: None.  Findings:  There is a geographic slightly ill-defined area of decreased attenuation within the posterior limb of the left internal capsule extending to involve the  left thalamus (images 13 and 14).  There is an additional geographic linear area of decreased attenuation within the more anterior aspect of the left basal ganglia (images 11-13).  Grossly symmetric periventricular hypodensities are seen about the occipital horn of the bilateral lateral ventricles compatible with microvascular ischemic disease.  No intraparenchymal or extra-axial mass or hemorrhage.  Normal size and configuration of the ventricles and basilar cisterns.  No midline shift.  There is minimal opacification within the left anterior ethmoidal air cells.  Remaining paranasal sinuses and mastoid air cells are normally aerated.  Regional soft tissues are normal.  No displaced calvarial fracture.  IMPRESSION: 1.  Age indeterminate, likely at least subacute, lacunar infarcts within the left basal ganglia and posterior limb of the left internal capsule/thalamus.  No intraparenchymal or extra-axial hemorrhage.  Further evaluation with MRI may be performed as clinically indicated.  2.  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   Original Report Authenticated By: Jake Seats, MD     Other results: EKG: Normal sinus rhythm - PACs. No significant Q. waves or ST-T changes concerning for ischemia or infarction.   Assessment &  Plan by Problem: Principal Problem:  *Acute lacunar stroke Active Problems:  DM type 2 (diabetes mellitus, type 2)  Tobacco abuse  HTN (hypertension)  1) Acute/subacute lacunar CVA: Patient with likely subacute CVA since Saturday and so he is out of the window for TPA. CT head shows left basal ganglia and posterior limb internal capsule/thalamus acute/subacute lacunar infarct.  Risks include diabetes, smoking. Patient denies history of hypertension but has mild to moderately elevated blood pressure in ED.  Would not consider this as aspirin failure as patient did not take any aspirin for last 2 weeks.  Plan : Admit to telemetry for inpatient CVA workup.  - MRI/MRA brain,  2-D echo and carotid Dopplers for vascular workup. - Risk stratification - A1c, lipid panel. - Aspirin 325 mg daily. - PT/OT/SLP consult. - Conservative management of bowel and bladder incontinence. - Permissive hypertension until tomorrow and consider starting diuretics or ACE inhibitor as needed. - Start statin after patient after patient has his swallow evaluation.  Until then n.p.o.   2) DM 2 : Followed by his PCP at outpatient. Is on metformin and one other oral medication - does not remember the name.  - Sliding scale sensitive for now. Hold metformin. - Check A1c.   3) Hypertension: No previous history per patient. - Start antihypertensives as needed from tomorrow.  4) Tobacco abuse: 1.5 packs cigarettes per day for 45 years. - Nicotine patch. - Quick counseling  Provided in ED- will need to talk about it more once patient stable.   5) DVT prophylaxis : Lovenox   Dispo: Disposition is deferred at this time, awaiting improvement of current medical problems. Anticipated discharge in approximately 2-3 day(s).   The patient does have a current PCP (HASANAJ,XAJE A, MD), therefore will not be requiring OPC follow-up after discharge.   The patient does not know have transportation limitations that hinder transportation to clinic appointments.  Signed: Luvia Orzechowski 05/27/2012, 10:42 AM

## 2012-05-27 NOTE — Evaluation (Signed)
Physical Therapy Evaluation Patient Details Name: Eric Scott MRN: [id number] DOB: 08-Oct-1950 Today's Date: 05/27/2012 Time: [id number] PT Time Calculation (min): 23 min  PT Assessment / Plan / Recommendation Clinical Impression  Patient is a 61 y/o male admitted with subacute, lacunar infarcts within the left basal ganglia and posterior limb of the left internal capsule/thalamus.  He presents to PT with decreased body position awareness impaired sitting and standing static and dynamic balance, decreased safety and deficit awareness and right LE weakness and will benefit from skilled PT to maximize independence and safety to allow d/c home with assist of friend/roomate.    PT Assessment  Patient needs continued PT services    Follow Up Recommendations  CIR    Does the patient have the potential to tolerate intense rehabilitation    yes          Equipment Recommendations  Rolling walker with 5" wheels         Frequency Min 4X/week    Precautions / Restrictions Precautions Precautions: Fall Precaution Comments: HIGH RISK FOR FALLS   Pertinent Vitals/Pain 8/10 in feet      Mobility  Bed Mobility Bed Mobility: Sit to Supine;Supine to Sit Supine to Sit: 2: Max assist Sit to Supine: 1: +2 Total assist Sit to Supine: Patient Percentage: 30% Details for Bed Mobility Assistance: cues for technique, right side awareness deficit and leaves right side behind when pulling up to sit Transfers Transfers: Stand to Sit;Sit to Stand Sit to Stand: From bed;3: Mod assist;1: +2 Total assist (first attempt with less assist, then nurse in room to assist) Sit to Stand: Patient Percentage: 40% Stand to Sit: 3: Mod assist;To bed;With upper extremity assist Details for Transfer Assistance: cues for safety with hand placement, for technique Ambulation/Gait Ambulation/Gait Assistance: 2: Max assist Ambulation Distance (Feet): 2 Feet Assistive device: Rolling walker Ambulation/Gait  Assistance Details: ambulated forward about 2 steps, then backwards to bed, secondary to severe right lean on PT when lifting right foot to move for ambulation. Gait Pattern: Lateral trunk lean to right              PT Diagnosis: Abnormality of gait;Generalized weakness;Hemiplegia dominant side  PT Problem List: Decreased strength;Decreased safety awareness;Decreased balance;Decreased knowledge of use of DME;Decreased cognition PT Treatment Interventions: DME instruction;Gait training;Neuromuscular re-education;Functional mobility training;Patient/family education;Therapeutic activities;Therapeutic exercise;Balance training   PT Goals Acute Rehab PT Goals PT Goal Formulation: With patient Time For Goal Achievement: 06/10/12 Potential to Achieve Goals: Good Pt will go Supine/Side to Sit: with min assist PT Goal: Supine/Side to Sit - Progress: Goal set today Pt will Sit at Edge of Bed: with min assist;with unilateral upper extremity support;3-5 min PT Goal: Sit at Edge Of Bed - Progress: Goal set today Pt will go Sit to Supine/Side: with min assist;with rail PT Goal: Sit to Supine/Side - Progress: Goal set today Pt will go Sit to Stand: with min assist;from elevated surface;with upper extremity assist PT Goal: Sit to Stand - Progress: Goal set today Pt will go Stand to Sit: with min assist PT Goal: Stand to Sit - Progress: Goal set today Pt will Stand: with min assist;with bilateral upper extremity support;3 - 5 min (during functional activity) PT Goal: Stand - Progress: Goal set today Pt will Ambulate: 51 - 150 feet;with least restrictive assistive device;with mod assist PT Goal: Ambulate - Progress: Goal set today  Visit Information  Last PT Received On: 05/27/12 Assistance Needed: +2    Subjective Data  Subjective: I have  never seen so many doctors Patient Stated Goal: If you think I need it (rehab)   Prior Functioning  Home Living Lives With: Friend(s) Available Help at  Discharge: Friend(s) Type of Home: Mobile home Home Access: Stairs to enter;Ramped entrance Home Layout: One level Home Adaptive Equipment: Straight cane Prior Function Level of Independence: Independent with assistive device(s) (roomate did cooking and cleaning mostly) Vocation: On disability Communication Communication: Expressive difficulties (dysarthric)    Cognition  Overall Cognitive Status: Impaired Area of Impairment: Attention;Awareness of deficits Arousal/Alertness: Awake/alert Behavior During Session: [facility] for tasks performed Current Attention Level: Sustained Attention - Other Comments: needs multiple cues to correct loss of balance, able to attend to mobility task for approx 3 min with cues for safety Awareness of Deficits: attempting to walk despite severe balance deficit and poor body position awareness    Extremity/Trunk Assessment Right Lower Extremity Assessment RLE ROM/Strength/Tone: Deficits RLE ROM/Strength/Tone Deficits: strength grossly 2-/5 hip and knee; ankle 3+/5 RLE Sensation: Deficits RLE Sensation Deficits: abnormal sensation with pain both feet Left Lower Extremity Assessment LLE ROM/Strength/Tone: WFL for tasks assessed   Balance Balance Balance Assessed: Yes Static Sitting Balance Static Sitting - Balance Support: Bilateral upper extremity supported;Feet supported Static Sitting - Level of Assistance: 3: Mod assist;2: Max assist Static Sitting - Comment/# of Minutes: leans hard to right, when left hand on bed rail able to sit with min assist, needs max assist to recover when leans too far to right Dynamic Sitting Balance Dynamic Sitting - Balance Support: Left upper extremity supported;During functional activity Dynamic Sitting - Level of Assistance: 2: Max assist Dynamic Sitting Balance - Compensations: Patient reaching to pull up sock on right foot and needs max assist to avoid loss of balance right and anterior. Static Standing Balance Static  Standing - Balance Support: Right upper extremity supported;Left upper extremity supported Static Standing - Level of Assistance: 3: Mod assist Static Standing - Comment/# of Minutes: few seconds prior to ambulation, needed cues for head righting and left lateral lean  End of Session PT - End of Session Equipment Utilized During Treatment: Gait belt Activity Tolerance: Patient limited by fatigue Patient left: in bed;with call bell/phone within reach;with family/visitor present Nurse Communication: Mobility status  GP     [facility] 05/27/2012, 4:05 PM

## 2012-05-28 DIAGNOSIS — I6350 Cerebral infarction due to unspecified occlusion or stenosis of unspecified cerebral artery: Secondary | ICD-10-CM

## 2012-05-28 DIAGNOSIS — R410 Disorientation, unspecified: Secondary | ICD-10-CM | POA: Diagnosis not present

## 2012-05-28 LAB — FOLATE: Folate: 10.8 ng/mL

## 2012-05-28 LAB — BASIC METABOLIC PANEL
BUN: 9 mg/dL (ref 6–23)
CO2: 26 mEq/L (ref 19–32)
Chloride: 106 mEq/L (ref 96–112)
GFR calc Af Amer: >90 mL/min (ref >90)
Glucose, Bld: 148 mg/dL — ABNORMAL HIGH (ref 70–99)
Potassium: 3.6 mEq/L (ref 3.5–5.1)
Sodium: 143 mEq/L (ref 135–145)

## 2012-05-28 LAB — LIPID PANEL
Cholesterol: 145 mg/dL (ref 0–200)
HDL: 37 mg/dL — ABNORMAL LOW (ref >39)
Triglycerides: 119 mg/dL (ref <150)

## 2012-05-28 LAB — RAPID URINE DRUG SCREEN, HOSP PERFORMED
Amphetamines: NOT DETECTED
Barbiturates: NOT DETECTED
Benzodiazepines: POSITIVE — AB
Opiates: POSITIVE — AB

## 2012-05-28 LAB — GLUCOSE, CAPILLARY: Glucose-Capillary: 136 mg/dL — ABNORMAL HIGH (ref 70–99)

## 2012-05-28 LAB — VITAMIN B12: Vitamin B-12: 573 pg/mL (ref 211–911)

## 2012-05-28 LAB — HIV ANTIBODY (ROUTINE TESTING W REFLEX): HIV: NONREACTIVE

## 2012-05-28 LAB — HEMOGLOBIN A1C
Hgb A1c MFr Bld: 5.3 % (ref <5.7)
Mean Plasma Glucose: 105 mg/dL (ref <117)

## 2012-05-28 LAB — RPR: RPR Ser Ql: NONREACTIVE

## 2012-05-28 MED ORDER — SIMVASTATIN 10 MG PO TABS
10.0000 mg | ORAL_TABLET | Freq: Every day | ORAL | Status: DC
  Administered 2012-05-28 – 2012-05-29 (×2): 10 mg via ORAL
  Filled 2012-05-28 (×3): qty 1

## 2012-05-28 MED ORDER — LORAZEPAM 2 MG/ML IJ SOLN
2.0000 mg | Freq: Once | INTRAMUSCULAR | Status: AC
  Administered 2012-05-28: 2 mg via INTRAVENOUS
  Filled 2012-05-28: qty 1

## 2012-05-28 MED ORDER — HYDROCHLOROTHIAZIDE 12.5 MG PO CAPS
12.5000 mg | ORAL_CAPSULE | Freq: Every day | ORAL | Status: DC
  Administered 2012-05-28 – 2012-05-30 (×3): 12.5 mg via ORAL
  Filled 2012-05-28 (×3): qty 1

## 2012-05-28 NOTE — Progress Notes (Addendum)
Subjective: Patient states he has be having fecal incontinence x 6 months and worsening lower extremity pain x 1.5 years.  He is agreeable to statin.  No complaints.  He reports normal sensation to b/l feet.     Overnight: the RN said the patient was not answering questions or following commands early this am Objective: Vital signs in last 24 hours: Filed Vitals:   05/27/12 2322 05/28/12 0241 05/28/12 0636 05/28/12 1141  BP: 141/66 154/85 168/84 147/77  Pulse: 78 103 100 63  Temp: 98.1 F (36.7 C) 98.1 F (36.7 C) 98.1 F (36.7 C) 97.6 F (36.4 C)  TempSrc: Oral Oral Oral Oral  Resp: 18 18 18 18   SpO2: 100% 100% 99% 100%   Weight change:  No intake or output data in the 24 hours ending 05/28/12 1607 General: lying in bed, nad, alert and oriented person, location HEENT: [place]/at CV:RRR no rubs, murmurs or gallops  Lungs: ctab  Abdomen: soft, obese, ntnd, normal bs  GU: no sphincter reflex  Extremities: no cyanosis or edema. Right 4 th toe laceration with dried blood, no evidence of drainage. Left great toe with ecchymosis and dried blood  Neuro: Decreased shoulder shrug right, Right upper extremity with 4-/5 strength, right lower extremity with 2/5 strength, left lower extremity with 3+/5 strength, otherwise CN 2-12 grossly intact   Lab Results: Basic Metabolic Panel:  Lab 0000000 0835 05/27/12 0648  NA 143 141  K 3.6 3.3*  CL 106 103  CO2 26 26  GLUCOSE 148* 140*  BUN 9 9  CREATININE 0.83 0.85  CALCIUM 8.6 9.1  MG -- --  PHOS -- --   Liver Function Tests:  Lab 05/27/12 0648  AST 16  ALT 14  ALKPHOS 111  BILITOT 0.6  PROT 7.0  ALBUMIN 3.4*   CBC:  Lab 05/27/12 0705  WBC 10.5  NEUTROABS 8.0*  HGB 17.0  HCT 47.5  MCV 97.9  PLT 200   CBG:  Lab 05/28/12 1220 05/27/12 2222 05/27/12 1728  GLUCAP 153* 133* 128*   Hemoglobin A1C:  Lab 05/28/12 0605  HGBA1C 5.3   Fasting Lipid Panel:  Lab 05/28/12 0500  CHOL 145  HDL 37*  LDLCALC 84  TRIG 119   CHOLHDL 3.9  LDLDIRECT --   Coagulation:  Lab 05/27/12 0705  LABPROT 12.5  INR 0.94   Drugs of Abuse     Component Value Date/Time   LABOPIA POSITIVE* 05/28/2012 0442   COCAINSCRNUR NONE DETECTED 05/28/2012 0442   LABBENZ POSITIVE* 05/28/2012 0442   AMPHETMU NONE DETECTED 05/28/2012 [place] DETECTED 05/28/2012 [place] DETECTED 05/28/2012 0442    Misc. Labs: tsh, B12, RPR, HIV, folate  Studies/Results: Dg Chest 2 View  05/27/2012  *RADIOLOGY REPORT*  Clinical Data: Body weakness  CHEST - 2 VIEW  Comparison: None.  Findings: Examination is degraded secondary to patient body habitus.  Enlarged cardiac silhouette and mediastinal contours. There is mild diffuse thickening of the pulmonary interstitium. Mild symmetric bibasilar heterogeneous opacities.  Asymmetric heterogeneous opacities are seen within the right mid lung.  No pleural effusion or pneumothorax.  No acute osseous abnormalities.  IMPRESSION: 1.  Enlarged cardiac silhouette.  Further evaluation of cardiac echo may performed as clinically indicated. 2.  Possible airspace opacity within the right mid lung may represent an area of developing infection.  Continued attention on follow-up is recommended.  3.  Symmetric bibasilar opacities are favored to represent atelectasis.   Original Report Authenticated By:  Jake Seats, MD    Ct Head Wo Contrast  05/27/2012  *RADIOLOGY REPORT*  Clinical Data: Right-sided weakness, unsteady gait, right-sided facial droop, last seen normal 3 days ago.  CT HEAD WITHOUT CONTRAST  Technique:  Contiguous axial images were obtained from the base of the skull through the vertex without contrast.  Comparison: None.  Findings:  There is a geographic slightly ill-defined area of decreased attenuation within the posterior limb of the left internal capsule extending to involve the left thalamus (images 13 and 14).  There is an additional geographic linear area of decreased attenuation  within the more anterior aspect of the left basal ganglia (images 11-13).  Grossly symmetric periventricular hypodensities are seen about the occipital horn of the bilateral lateral ventricles compatible with microvascular ischemic disease.  No intraparenchymal or extra-axial mass or hemorrhage.  Normal size and configuration of the ventricles and basilar cisterns.  No midline shift.  There is minimal opacification within the left anterior ethmoidal air cells.  Remaining paranasal sinuses and mastoid air cells are normally aerated.  Regional soft tissues are normal.  No displaced calvarial fracture.  IMPRESSION: 1.  Age indeterminate, likely at least subacute, lacunar infarcts within the left basal ganglia and posterior limb of the left internal capsule/thalamus.  No intraparenchymal or extra-axial hemorrhage.  Further evaluation with MRI may be performed as clinically indicated.  2.  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   Original Report Authenticated By: Jake Seats, MD    Mr Brain Wo Contrast  05/28/2012  *RADIOLOGY REPORT*  Clinical Data:  Stroke.  Several day history of right-sided weakness.  Abnormal CT scan of the head.  MRI HEAD WITHOUT CONTRAST MRA HEAD WITHOUT CONTRAST  Technique:  Multiplanar, multiecho pulse sequences of the brain and surrounding structures were obtained without intravenous contrast. Angiographic images of the head were obtained using MRA technique without contrast.  Comparison:  CT head without contrast 05/27/2012.  MRI HEAD  Findings:  The diffusion weighted images confirm an acute non hemorrhagic infarct along the posterior limb of the left internal capsule.  There may be some involvement of the left lateral thalamus. A slightly older subacute non hemorrhagic infarct is evident within the anterior left external capsule with some involvement of the caudate head and lentiform nucleus.  Mild generalized atrophy is present.  There is extensive periventricular subcortical  white matter change bilaterally.  The perivascular spaces are dilated. A more remote lacunar infarct is present within the left thalamus.  Flow is present in the major intracranial arteries.  The globes and orbits are intact.  Mild mucosal thickening is present in the anterior ethmoid air cells bilaterally.  The paranasal sinuses and mastoid air cells are otherwise clear.  IMPRESSION:  1.  Acute non hemorrhagic infarct involving the posterior limb of the left internal capsule and portions of the lateral thalamus. 2.  Slightly older subacute non hemorrhagic infarct involving the anterior left external capsule with slight involvement of the left caudate head and anterior lentiform nucleus. 3.  Remote lacunar infarct of the left thalamus. 4.  Atrophy and extensive white matter disease.  This likely reflects the sequelae of chronic microvascular ischemia.  MRA HEAD  Findings: The study is mildly degraded by patient motion.  The internal carotid arteries are within normal limits from high cervical segments through the ICA termini bilaterally.  Accounting for motion, the A1 and M1 segments are normal.  There is some attenuation of distal MCA branch vessels bilaterally without a significant proximal  stenosis or occlusion.  The vertebral arteries are codominant.  The PICA origins are visualized and within normal limits bilaterally.  The basilar artery is within normal limits.  There is a moderate proximal stenosis of the right P1 segment.  Distal small vessel attenuation is present bilaterally.  IMPRESSION:  1.  Moderate small vessel disease. The acute or subacute infarcts are both within the territory of perforators, compatible with small vessel disease. 2.  Moderate proximal stenosis of the right P1 segment. 3.  No other significant proximal stenosis, aneurysm, or branch vessel occlusion.   Original Report Authenticated By: San Morelle, M.D.    Mr Mra Head/brain Wo Cm  05/28/2012  *RADIOLOGY REPORT*  Clinical  Data:  Stroke.  Several day history of right-sided weakness.  Abnormal CT scan of the head.  MRI HEAD WITHOUT CONTRAST MRA HEAD WITHOUT CONTRAST  Technique:  Multiplanar, multiecho pulse sequences of the brain and surrounding structures were obtained without intravenous contrast. Angiographic images of the head were obtained using MRA technique without contrast.  Comparison:  CT head without contrast 05/27/2012.  MRI HEAD  Findings:  The diffusion weighted images confirm an acute non hemorrhagic infarct along the posterior limb of the left internal capsule.  There may be some involvement of the left lateral thalamus. A slightly older subacute non hemorrhagic infarct is evident within the anterior left external capsule with some involvement of the caudate head and lentiform nucleus.  Mild generalized atrophy is present.  There is extensive periventricular subcortical white matter change bilaterally.  The perivascular spaces are dilated. A more remote lacunar infarct is present within the left thalamus.  Flow is present in the major intracranial arteries.  The globes and orbits are intact.  Mild mucosal thickening is present in the anterior ethmoid air cells bilaterally.  The paranasal sinuses and mastoid air cells are otherwise clear.  IMPRESSION:  1.  Acute non hemorrhagic infarct involving the posterior limb of the left internal capsule and portions of the lateral thalamus. 2.  Slightly older subacute non hemorrhagic infarct involving the anterior left external capsule with slight involvement of the left caudate head and anterior lentiform nucleus. 3.  Remote lacunar infarct of the left thalamus. 4.  Atrophy and extensive white matter disease.  This likely reflects the sequelae of chronic microvascular ischemia.  MRA HEAD  Findings: The study is mildly degraded by patient motion.  The internal carotid arteries are within normal limits from high cervical segments through the ICA termini bilaterally.  Accounting for  motion, the A1 and M1 segments are normal.  There is some attenuation of distal MCA branch vessels bilaterally without a significant proximal stenosis or occlusion.  The vertebral arteries are codominant.  The PICA origins are visualized and within normal limits bilaterally.  The basilar artery is within normal limits.  There is a moderate proximal stenosis of the right P1 segment.  Distal small vessel attenuation is present bilaterally.  IMPRESSION:  1.  Moderate small vessel disease. The acute or subacute infarcts are both within the territory of perforators, compatible with small vessel disease. 2.  Moderate proximal stenosis of the right P1 segment. 3.  No other significant proximal stenosis, aneurysm, or branch vessel occlusion.   Original Report Authenticated By: San Morelle, M.D.    Medications:  Scheduled Meds:    . aspirin  300 mg Rectal Daily   Or  . aspirin  325 mg Oral Daily  . enoxaparin (LOVENOX) injection  40 mg Subcutaneous Q24H  . hydrochlorothiazide  12.5 mg Oral Daily  . insulin aspart  0-5 Units Subcutaneous QHS  . insulin aspart  0-9 Units Subcutaneous TID WC  . nicotine  21 mg Transdermal Daily  . simvastatin  10 mg Oral q1800   Continuous Infusions:    . sodium chloride 100 mL/hr at 05/27/12 1330   PRN Meds:.acetaminophen, ondansetron (ZOFRAN) IV Assessment/Plan: 1. Multifocal acute and subacute infarcts in left posterior limb internal capsule, anterior left external capsule with slight involvement of the left caudate head and anterior lentiform nucleus -With deficits  Decreased right shoulder shrug, right lower leg weakness >right upper arm weaknesss, slight right facial droop, finger to nose right not intact  -US carotid with mild plaque disease in the bifurcations bilaterally. No significant ICA stenosis. Vertebral artery flow is antegrade, echo with EF 55-60%, severe concentric hypertrophy. MRI also with atrophy, small vessel disease. Moderate proximal  stenosis of right PI segment. HA1C 5.2. LDL 84  Plan -Aspirin 325 mg qd  -PT/OT consulted, rehab consulted  -LDL 84 but will start statin Zocor 10 qhs and transition to Pravastatin at d/c for affordability though unclear if statin in normal LDL prevents CVA reoccurrence.  This was explained to the patient.  2. HTN  -Added  HCTZ 12.5 mg qd  -monitor bp   3. Right 4th toe laceration  -wound care RN   4. Fecal/urinary incontinence and peripheral neuropathy -x 6 months with leg pain worsing x 1.5 year and no sphincter reflex  -will do MRI with contrast spine to w/u for tumors, worsening degenerative changes since 2002 MRI. MRI will be 12/26 -pending tsh, B12, RPR, HIV, folate   5. DM 2  -5.2 HA1C, SSI   6. Tobacco abuse  -smoking 2 ppd  -Rx Nicotine patch 21 mcg   7. DVT Px  -Lovenox   8. F/E/N  -NS 10-20 cc/hr  -will monitor labs  -cardiac diet    Dispo: Disposition is deferred at this time, awaiting improvement of current medical problems.  Anticipated discharge in approximately 2-3 day(s).   The patient does have a current PCP (HASANAJ,XAJE A, MD), therefore will be requiring [facility] follow-up after discharge.   The patient does not have transportation limitations that hinder transportation to clinic appointments.  .Services Needed at time of discharge: Y = Yes, Blank = No PT:   OT:   RN:   Equipment:   Other:     LOS: 1 day   Cresenciano Genre 05/28/2012, 4:07 PM [phone]

## 2012-05-28 NOTE — Progress Notes (Signed)
Around 0400, RN went into room to fix pt's covers and fix telemetry leads. Pt began pulling at covers and IV tubing. Not answering RN's questions or following any commands.  Pt seemed very different from previous neuro check, but does not have any other neuro deficits. Green non-restraint mittens placed on pt to keep from pulling out IV. Pt continuously leaning against rails and trying to pull self out of bed. Pt not responding to RN's questions. MD paged regarding pt's agitation. Ativan 2mg  IV ordered. Ativan given around 0423. Pt tried to pull out IV with mittens on while RN was administering drug. Pt now answering questions and following commands, but will not explain why he was unable to answer questions earlier for nurse. Will continue to monitor pt's neuro status.  Elspeth Cho, RN 05/28/12 [phone]

## 2012-05-28 NOTE — Progress Notes (Signed)
Internal Medicine Teaching Service Attending Note Date: 05/28/2012  Patient name: Eric Scott  Medical record number: [id number]  Date of birth: Oct 24, 1950    This patient has been seen and discussed with the house staff. Please see their note for complete details. I concur with their findings with the following additions/corrections: When I entered the room, the pt was figitting in bed. He had pulled out the IV but couldn't tell me why. The condom cath had also been pulled off and thrown on floor. He is unable to tell me the year but was able to name [facility] and new his DOB. He is able to follow simple commands. He only ate one bite of sausage this AM bc he didn't like the way it tasted. His neuro exam is changed this AM - R grip and elbow flexion / extension is nl but will not able to move R shoulder. Ankles B 3-4 but hip flexion is decreased.   ECHO, MRI, and carotids have been performed. MRI confirms acute CVA's and are c/w small vessel dz. Therefore, tx will be directed towards RF mgmt (statin, tobacco cessation, BP control) and ASA. Dispo - inpt rehab consult. As for the confusion - likely delirium 2/2 acute CVA and hospitalization and the tubes and wires. Mild now - try non-pharmaceuticals for now. No signs / sxs of infx or cardiac ischemia to explain the delirium.   Kavitha Lansdale 05/28/2012, 10:53 AM

## 2012-05-28 NOTE — Progress Notes (Signed)
VASCULAR LAB PRELIMINARY  PRELIMINARY  PRELIMINARY  PRELIMINARY  Carotid duplex has been completed.    Preliminary report: Mild plaque disease in the bifurcations bilaterally.  No significant ICA stenosis.  Vertebral artery flow is antegrade.  Marcayla Budge, RVT 05/28/2012, 10:04 AM

## 2012-05-29 ENCOUNTER — Inpatient Hospital Stay (HOSPITAL_COMMUNITY): Payer: Medicare Other

## 2012-05-29 DIAGNOSIS — I6330 Cerebral infarction due to thrombosis of unspecified cerebral artery: Secondary | ICD-10-CM

## 2012-05-29 LAB — CBC WITH DIFFERENTIAL/PLATELET
Basophils Absolute: 0 10*3/uL (ref 0.0–0.1)
Basophils Relative: 0 % (ref 0–1)
Eosinophils Absolute: 0.1 10*3/uL (ref 0.0–0.7)
Hemoglobin: 16.7 g/dL (ref 13.0–17.0)
Lymphocytes Relative: 15 % (ref 12–46)
Lymphs Abs: 1.6 10*3/uL (ref 0.7–4.0)
MCH: 34.5 pg — ABNORMAL HIGH (ref 26.0–34.0)
MCHC: 35.5 g/dL (ref 30.0–36.0)
MCV: 97.1 fL (ref 78.0–100.0)
Monocytes Relative: 7 % (ref 3–12)
Neutro Abs: 8.1 10*3/uL — ABNORMAL HIGH (ref 1.7–7.7)
Neutrophils Relative %: 77 % (ref 43–77)
RBC: 4.84 MIL/uL (ref 4.22–5.81)
WBC: 10.6 10*3/uL — ABNORMAL HIGH (ref 4.0–10.5)

## 2012-05-29 LAB — GLUCOSE, CAPILLARY

## 2012-05-29 MED ORDER — ENSURE COMPLETE PO LIQD
237.0000 mL | Freq: Two times a day (BID) | ORAL | Status: DC
  Administered 2012-05-29 – 2012-05-30 (×2): 237 mL via ORAL

## 2012-05-29 MED ORDER — HYDROCODONE-ACETAMINOPHEN 5-325 MG PO TABS: 1.0000 | ORAL_TABLET | ORAL | Status: DC | PRN

## 2012-05-29 MED ORDER — GADOBENATE DIMEGLUMINE 529 MG/ML IV SOLN
20.0000 mL | Freq: Once | INTRAVENOUS | Status: AC
  Administered 2012-05-29: 20 mL via INTRAVENOUS

## 2012-05-29 MED ORDER — METRONIDAZOLE 500 MG PO TABS
500.0000 mg | ORAL_TABLET | Freq: Three times a day (TID) | ORAL | Status: DC
  Administered 2012-05-29 – 2012-05-30 (×4): 500 mg via ORAL
  Filled 2012-05-29 (×7): qty 1

## 2012-05-29 NOTE — Progress Notes (Signed)
Subjective: Patient stated lower extremity pain is 8/10 aching.  He denies other complaints.  Denies abdominal pain  Objective: Vital signs in last 24 hours: Filed Vitals:   05/28/12 2328 05/29/12 0200 05/29/12 0645 05/29/12 1545  BP: 174/69 138/66 178/84 177/89  Pulse: 77 39 54 72  Temp: 98.4 F (36.9 C) 98.2 F (36.8 C) 99.2 F (37.3 C) 98.2 F (36.8 C)  TempSrc: Oral Oral Oral Oral  Resp: 18 18 18 17   SpO2: 98% 97% 100% 99%   Weight change:   Intake/Output Summary (Last 24 hours) at 05/29/12 1706 Last data filed at 05/29/12 0200  Gross per 24 hour  Intake      0 ml  Output   1000 ml  Net  -1000 ml   General: lying in bed, nad, alert and oriented to place, person though he did not remember seeing PT/OT this am HEENT: [place]/at CV: RRR no rubs, murmurs, or gallops Lungs: ctab Abdomen: soft, ntnd, +normal bs  Extremities: warm, no cyanosis or edema  Neuro: CN 2-12 grossly intact, slight right facial droop, right lower extremity with 2/5 strength, right upper extremity 3+/5 strength   Lab Results: Basic Metabolic Panel:  Lab 0000000 0835 05/27/12 0648  NA 143 141  K 3.6 3.3*  CL 106 103  CO2 26 26  GLUCOSE 148* 140*  BUN 9 9  CREATININE 0.83 0.85  CALCIUM 8.6 9.1  MG -- --  PHOS -- --   Liver Function Tests:  Lab 05/27/12 0648  AST 16  ALT 14  ALKPHOS 111  BILITOT 0.6  PROT 7.0  ALBUMIN 3.4*   CBC:  Lab 05/29/12 1327 05/27/12 0705  WBC 10.6* 10.5  NEUTROABS 8.1* 8.0*  HGB 16.7 17.0  HCT 47.0 47.5  MCV 97.1 97.9  PLT 199 200   CBG:  Lab 05/29/12 0717 05/28/12 2326 05/28/12 1710 05/28/12 1220 05/28/12 0635 05/27/12 2222  GLUCAP 124* 136* 96 153* 82 133*   Hemoglobin A1C:  Lab 05/28/12 0605  HGBA1C 5.3   Fasting Lipid Panel:  Lab 05/28/12 0500  CHOL 145  HDL 37*  LDLCALC 84  TRIG 119  CHOLHDL 3.9  LDLDIRECT --   Coagulation:  Lab 05/27/12 0705  LABPROT 12.5  INR 0.94   Drugs of Abuse     Component Value Date/Time   LABOPIA  POSITIVE* 05/28/2012 0442   COCAINSCRNUR NONE DETECTED 05/28/2012 0442   LABBENZ POSITIVE* 05/28/2012 0442   AMPHETMU NONE DETECTED 05/28/2012 [place] DETECTED 05/28/2012 [place] DETECTED 05/28/2012 0442    Misc. Labs: none Studies/Results: Mr Brain Wo Contrast  05/28/2012  *RADIOLOGY REPORT*  Clinical Data:  Stroke.  Several day history of right-sided weakness.  Abnormal CT scan of the head.  MRI HEAD WITHOUT CONTRAST MRA HEAD WITHOUT CONTRAST  Technique:  Multiplanar, multiecho pulse sequences of the brain and surrounding structures were obtained without intravenous contrast. Angiographic images of the head were obtained using MRA technique without contrast.  Comparison:  CT head without contrast 05/27/2012.  MRI HEAD  Findings:  The diffusion weighted images confirm an acute non hemorrhagic infarct along the posterior limb of the left internal capsule.  There may be some involvement of the left lateral thalamus. A slightly older subacute non hemorrhagic infarct is evident within the anterior left external capsule with some involvement of the caudate head and lentiform nucleus.  Mild generalized atrophy is present.  There is extensive periventricular subcortical white matter change bilaterally.  The  perivascular spaces are dilated. A more remote lacunar infarct is present within the left thalamus.  Flow is present in the major intracranial arteries.  The globes and orbits are intact.  Mild mucosal thickening is present in the anterior ethmoid air cells bilaterally.  The paranasal sinuses and mastoid air cells are otherwise clear.  IMPRESSION:  1.  Acute non hemorrhagic infarct involving the posterior limb of the left internal capsule and portions of the lateral thalamus. 2.  Slightly older subacute non hemorrhagic infarct involving the anterior left external capsule with slight involvement of the left caudate head and anterior lentiform nucleus. 3.  Remote lacunar infarct of the  left thalamus. 4.  Atrophy and extensive white matter disease.  This likely reflects the sequelae of chronic microvascular ischemia.  MRA HEAD  Findings: The study is mildly degraded by patient motion.  The internal carotid arteries are within normal limits from high cervical segments through the ICA termini bilaterally.  Accounting for motion, the A1 and M1 segments are normal.  There is some attenuation of distal MCA branch vessels bilaterally without a significant proximal stenosis or occlusion.  The vertebral arteries are codominant.  The PICA origins are visualized and within normal limits bilaterally.  The basilar artery is within normal limits.  There is a moderate proximal stenosis of the right P1 segment.  Distal small vessel attenuation is present bilaterally.  IMPRESSION:  1.  Moderate small vessel disease. The acute or subacute infarcts are both within the territory of perforators, compatible with small vessel disease. 2.  Moderate proximal stenosis of the right P1 segment. 3.  No other significant proximal stenosis, aneurysm, or branch vessel occlusion.   Original Report Authenticated By: San Morelle, M.D.    Mr Thoracic Spine W Wo Contrast  05/29/2012  *RADIOLOGY REPORT*  Clinical Data:  61 year old male with back pain, incontinence. Diabetes.  Lower extremity pain.  Contrast: 20 ml MultiHance.  MRI THORACIC SPINE WITHOUT AND WITH CONTRAST  Technique: Multiplanar and multiecho pulse sequences of the thoracic spine were obtained without and  with intravenous contrast.  Comparison: Brain MRI 05/27/2012.  CT abdomen and pelvis 07/20/2010 and earlier.  Findings:  Limited sagittal imaging of most of the cervical spine is within normal limits.  Thoracic vertebral height and alignment is within normal limits. No marrow edema or evidence of acute osseous abnormality.  No abnormal enhancement identified.  Spinal cord signal is within normal limits at all visualized levels.  Conus medullaris better  depicted on lumbar images below.  Mild for age thoracic spine degeneration.  The only focal disc herniation identified is a small left paracentral disc protrusion at T10-T11.  No significant spinal stenosis results.  No foraminal stenosis identified.  Dependent atelectasis.  Chronic right adrenal adenoma partially visible.  IMPRESSION: 1.  Mild for age thoracic spine degenerative changes with no spinal stenosis.  No acute findings identified in the thoracic spine. 2.  See lumbar findings below.  MRI LUMBAR SPINE WITHOUT AND WITH CONTRAST  Technique: Multiplanar and multiecho pulse sequences of the lumbar spine were obtained without and with intravenous contrast.  Findings:  Normal lumbar segmentation.  Trace retrolisthesis of L5 on S1, otherwise normal vertebral height and alignment.  Marrow signal within normal limits.  There is thin linear enhancement of the right S2 nerve root throughout the cauda equina (series 20 image 7, series 21 image 32).  No associated focal impingement of the nerve root is identified, but this might be related to more generalized spinal  stenosis as described below.  No abnormal enhancement identified.   Visualized lower thoracic spinal cord is normal with conus medularis at L1-L2.  Negative visualized abdominal viscera. Visualized paraspinal soft tissues are within normal limits.  T12-L1:  Negative.  L1-L2:  Negative.  L2-L3:  Mild disc desiccation and disc bulge.  Mild facet and ligament flavum hypertrophy.  Borderline to mild spinal stenosis.  L3-L4:  Mostly far lateral bilateral disc bulges and endplate spurring.  Mild to moderate facet and ligament flavum hypertrophy. Borderline to mild spinal stenosis.  Mild right greater than left L3 foraminal stenosis.  L4-L5:  Right eccentric circumferential disc osteophyte complex.  A small right foraminal annular tear.  Moderate facet and ligament flavum hypertrophy also greater on the right.  Borderline to mild spinal stenosis.  Mild right  lateral recess stenosis.  Moderate right L4 foraminal stenosis.  L5-S1:  Disc space loss.  Suspect vacuum disc phenomena.  Left eccentric circumferential disc osteophyte complex.  Incidental left L5 inferior endplate benign vertebral hemangioma.  Mild facet and ligament flavum hypertrophy.  Mild left greater than right lateral recess stenosis.  No significant spinal stenosis.  Moderate left L5 foraminal stenosis.  IMPRESSION: 1. 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  Borderline to mild multifactorial spinal stenosis from L2-L3 to L4-L5. 2.  Multifactorial moderate right L4 and left L5 neural foraminal stenosis.  Mild lumbar foraminal stenosis elsewhere. 3.  Thin linear enhancement of the right S2 nerve root throughout the cauda equina, favor related to the above degenerative changes. No other abnormal enhancement.   Original Report Authenticated By: Roselyn Reef, M.D.    Mr Lumbar Spine W Wo Contrast  05/29/2012  *RADIOLOGY REPORT*  Clinical Data:  61 year old male with back pain, incontinence. Diabetes.  Lower extremity pain.  Contrast: 20 ml MultiHance.  MRI THORACIC SPINE WITHOUT AND WITH CONTRAST  Technique: Multiplanar and multiecho pulse sequences of the thoracic spine were obtained without and  with intravenous contrast.  Comparison: Brain MRI 05/27/2012.  CT abdomen and pelvis 07/20/2010 and earlier.  Findings:  Limited sagittal imaging of most of the cervical spine is within normal limits.  Thoracic vertebral height and alignment is within normal limits. No marrow edema or evidence of acute osseous abnormality.  No abnormal enhancement identified.  Spinal cord signal is within normal limits at all visualized levels.  Conus medullaris better depicted on lumbar images below.  Mild for age thoracic spine degeneration.  The only focal disc herniation identified is a small left paracentral disc protrusion at T10-T11.  No significant spinal stenosis results.  No foraminal stenosis identified.   Dependent atelectasis.  Chronic right adrenal adenoma partially visible.  IMPRESSION: 1.  Mild for age thoracic spine degenerative changes with no spinal stenosis.  No acute findings identified in the thoracic spine. 2.  See lumbar findings below.  MRI LUMBAR SPINE WITHOUT AND WITH CONTRAST  Technique: Multiplanar and multiecho pulse sequences of the lumbar spine were obtained without and with intravenous contrast.  Findings:  Normal lumbar segmentation.  Trace retrolisthesis of L5 on S1, otherwise normal vertebral height and alignment.  Marrow signal within normal limits.  There is thin linear enhancement of the right S2 nerve root throughout the cauda equina (series 20 image 7, series 21 image 32).  No associated focal impingement of the nerve root is identified, but this might be related to more generalized spinal stenosis as described below.  No abnormal enhancement identified.   Visualized lower thoracic spinal cord is normal  with conus medularis at L1-L2.  Negative visualized abdominal viscera. Visualized paraspinal soft tissues are within normal limits.  T12-L1:  Negative.  L1-L2:  Negative.  L2-L3:  Mild disc desiccation and disc bulge.  Mild facet and ligament flavum hypertrophy.  Borderline to mild spinal stenosis.  L3-L4:  Mostly far lateral bilateral disc bulges and endplate spurring.  Mild to moderate facet and ligament flavum hypertrophy. Borderline to mild spinal stenosis.  Mild right greater than left L3 foraminal stenosis.  L4-L5:  Right eccentric circumferential disc osteophyte complex.  A small right foraminal annular tear.  Moderate facet and ligament flavum hypertrophy also greater on the right.  Borderline to mild spinal stenosis.  Mild right lateral recess stenosis.  Moderate right L4 foraminal stenosis.  L5-S1:  Disc space loss.  Suspect vacuum disc phenomena.  Left eccentric circumferential disc osteophyte complex.  Incidental left L5 inferior endplate benign vertebral hemangioma.  Mild  facet and ligament flavum hypertrophy.  Mild left greater than right lateral recess stenosis.  No significant spinal stenosis.  Moderate left L5 foraminal stenosis.  IMPRESSION: 1. 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  Borderline to mild multifactorial spinal stenosis from L2-L3 to L4-L5. 2.  Multifactorial moderate right L4 and left L5 neural foraminal stenosis.  Mild lumbar foraminal stenosis elsewhere. 3.  Thin linear enhancement of the right S2 nerve root throughout the cauda equina, favor related to the above degenerative changes. No other abnormal enhancement.   Original Report Authenticated By: Roselyn Reef, M.D.    Mr Mra Head/brain Wo Cm  05/28/2012  *RADIOLOGY REPORT*  Clinical Data:  Stroke.  Several day history of right-sided weakness.  Abnormal CT scan of the head.  MRI HEAD WITHOUT CONTRAST MRA HEAD WITHOUT CONTRAST  Technique:  Multiplanar, multiecho pulse sequences of the brain and surrounding structures were obtained without intravenous contrast. Angiographic images of the head were obtained using MRA technique without contrast.  Comparison:  CT head without contrast 05/27/2012.  MRI HEAD  Findings:  The diffusion weighted images confirm an acute non hemorrhagic infarct along the posterior limb of the left internal capsule.  There may be some involvement of the left lateral thalamus. A slightly older subacute non hemorrhagic infarct is evident within the anterior left external capsule with some involvement of the caudate head and lentiform nucleus.  Mild generalized atrophy is present.  There is extensive periventricular subcortical white matter change bilaterally.  The perivascular spaces are dilated. A more remote lacunar infarct is present within the left thalamus.  Flow is present in the major intracranial arteries.  The globes and orbits are intact.  Mild mucosal thickening is present in the anterior ethmoid air cells bilaterally.  The paranasal sinuses and mastoid air cells are  otherwise clear.  IMPRESSION:  1.  Acute non hemorrhagic infarct involving the posterior limb of the left internal capsule and portions of the lateral thalamus. 2.  Slightly older subacute non hemorrhagic infarct involving the anterior left external capsule with slight involvement of the left caudate head and anterior lentiform nucleus. 3.  Remote lacunar infarct of the left thalamus. 4.  Atrophy and extensive white matter disease.  This likely reflects the sequelae of chronic microvascular ischemia.  MRA HEAD  Findings: The study is mildly degraded by patient motion.  The internal carotid arteries are within normal limits from high cervical segments through the ICA termini bilaterally.  Accounting for motion, the A1 and M1 segments are normal.  There is some attenuation of distal MCA  branch vessels bilaterally without a significant proximal stenosis or occlusion.  The vertebral arteries are codominant.  The PICA origins are visualized and within normal limits bilaterally.  The basilar artery is within normal limits.  There is a moderate proximal stenosis of the right P1 segment.  Distal small vessel attenuation is present bilaterally.  IMPRESSION:  1.  Moderate small vessel disease. The acute or subacute infarcts are both within the territory of perforators, compatible with small vessel disease. 2.  Moderate proximal stenosis of the right P1 segment. 3.  No other significant proximal stenosis, aneurysm, or branch vessel occlusion.   Original Report Authenticated By: San Morelle, M.D.    Medications:  Scheduled Meds:    . aspirin  300 mg Rectal Daily   Or  . aspirin  325 mg Oral Daily  . enoxaparin (LOVENOX) injection  40 mg Subcutaneous Q24H  . feeding supplement  237 mL Oral BID BM  . hydrochlorothiazide  12.5 mg Oral Daily  . metroNIDAZOLE  500 mg Oral Q8H  . nicotine  21 mg Transdermal Daily  . simvastatin  10 mg Oral q1800   Continuous Infusions:    . sodium chloride 100 mL/hr at  05/27/12 1330   PRN Meds:.acetaminophen, HYDROcodone-acetaminophen, ondansetron (ZOFRAN) IV Assessment/Plan: 1. Multifocal acute and subacute infarcts in left posterior limb internal capsule, anterior left external capsule with slight involvement of the left caudate head and anterior lentiform nucleus -With deficits  Decreased right shoulder shrug, right lower leg weakness >right upper arm weaknesss, slight right facial droop, finger to nose right not intact   Plan -Aspirin 325 mg qd  -PT/OT consulted, rehab consulted  -he may go to rehab tomorrow if insurance approved  -LDL 84 but Tx Zocor 10 qhs and transition to Pravastatin at d/c   2. HTN  -HCTZ 12.5 mg qd.  This may be elevated secondary to pain or antihypertensives may need to be elevated.  -will monitor blood pressure   3. Right 4th toe laceration  -wound care RN consulted recommend to wrap the toe with Vaseline gauze  to insulate and will not adhere to the wound bed, weave gauze between toes to hold in place.  4. C.difficile positive with diarrhea -Rx Flagyl 500 mg tid x 10-14 days   5. Fecal/urinary incontinence and peripheral neuropathy and leg pain -With concern for compression or cauda equina syndrome.  MRI with mild thoracic spine degenerative changes with no spinal stenosis. No acute findings. Lumbar with chronic lower lumbar disc facet degeneration.  Borderline to mild multifactorial spinal stenosis L2-L3 to L4-L5.  Mild lumbar foraminal stenosis elsewhere.    6. DM 2  -5.3 HA1C, SSI   7. Tobacco abuse  -smoking 2 ppd  -Rx Nicotine patch 21 mcg   8. DVT Px  -Lovenox   9. F/E/N  -NS 10-20 cc/hr  -will monitor labs  -cardiac diet    Dispo: hopefully to rehab (CIR) in the am   The patient does have a current PCP (HASANAJ,XAJE A, MD), therefore will be requiring OPC follow-up after discharge.   The patient does not have transportation limitations that hinder transportation to clinic appointments.  .Services  Needed at time of discharge: Y = Yes, Blank = No PT:   OT:   RN:   Equipment:   Other: CIR    LOS: 2 days   Cresenciano Genre 05/29/2012, 5:06 PM [phone]

## 2012-05-29 NOTE — Progress Notes (Signed)
Internal Medicine Teaching Service Attending Note Date: 05/29/2012  Patient name: Eric Scott  Medical record number: [id number]  Date of birth: [birth year]    This patient has been seen and discussed with the house staff. Please see their note for complete details. I concur with their findings with the following additions/corrections: Mr Benezra was sleeping but is arousable and responsive. His ABD is soft and NT. Neuro exam not able to be performed as he is still sleepy.   1. Acute ischemic CVA - ASA, statin, PT/OT. May be candidate for inpt Rehab  2. C diff- on metronidazole  3. Delirium - follow. Avoid pharmaceuticals  4. LE edema and bowel / bladder incontinence - MRI thoracic and lumbar negative for cord compression.   Aeron Donaghey 05/29/2012, 3:27 PM

## 2012-05-29 NOTE — Progress Notes (Signed)
Physical Therapy Treatment Patient Details Name: Eric Scott MRN: [id number] DOB: 1950/08/11 Today's Date: 05/29/2012 Time: [id number] PT Time Calculation (min): 20 min  PT Assessment / Plan / Recommendation Comments on Treatment Session  Pt very pleasant & motivated to participate in PT session.   Cont's to have significant Rt side deficits but was able to improve with sitting & standing balance today as evident in requiring decreased (A) to sit midline.  When pt begins to Rt side, he does not demonstrate self-correction until cued to do so.      Follow Up Recommendations  CIR     Does the patient have the potential to tolerate intense rehabilitation     Barriers to Discharge        Equipment Recommendations  Rolling walker with 5" wheels    Recommendations for Other Services    Frequency Min 4X/week   Plan Discharge plan remains appropriate    Precautions / Restrictions Precautions Precautions: Fall Restrictions Weight Bearing Restrictions: No   Pertinent Vitals/Pain C/o pain in bil feet.     Mobility  Bed Mobility Bed Mobility: Rolling Left;Left Sidelying to Sit;Sitting - Scoot to Edge of Bed Rolling Left: 1: +1 Total assist;With rail Left Sidelying to Sit: 1: +2 Total assist;With rails;HOB flat Left Sidelying to Sit: Patient Percentage: 40% Sitting - Scoot to Edge of Bed: 2: Max assist Details for Bed Mobility Assistance: Pt rolled to Lt side (strong side) with max directional cues & total (A) to initiate & carryout rolling onto side.  Pt did well with grabbing rail with Lt UE & was assisted to position RUE across body to rail on Lt.  (A) for LE's & to lift shoulders/trunk to sitting upright with use of draw pad to pivot hips around to EOB.   Transfers Transfers: Sit to Stand;Stand to Sit;Stand Pivot Transfers Sit to Stand: 1: +2 Total assist;With upper extremity assist;From bed Sit to Stand: Patient Percentage: 40% Stand to Sit: 1: +2 Total assist;With upper  extremity assist;With armrests;To chair/3-in-1 Stand to Sit: Patient Percentage: 60% Stand Pivot Transfers: 1: +2 Total assist Stand Pivot Transfers: Patient Percentage: 60% Details for Transfer Assistance: Cues for sequencingt & technique.  Cues for tall posture.  Pt able to move LE's fairly well with pivot from bed>recliner.  (A) for balance & safety.        PT Goals Acute Rehab PT Goals Time For Goal Achievement: 06/10/12 Potential to Achieve Goals: Good Pt will go Supine/Side to Sit: with min assist PT Goal: Supine/Side to Sit - Progress: Progressing toward goal Pt will Sit at [facility] of Bed: with min assist;with unilateral upper extremity support;3-5 min PT Goal: Sit at Edge Of Bed - Progress: Progressing toward goal Pt will go Sit to Supine/Side: with min assist;with rail Pt will go Sit to Stand: with min assist;from elevated surface;with upper extremity assist PT Goal: Sit to Stand - Progress: Progressing toward goal Pt will go Stand to Sit: with min assist PT Goal: Stand to Sit - Progress: Progressing toward goal Pt will Stand: with min assist;with bilateral upper extremity support;3 - 5 min PT Goal: Stand - Progress: Progressing toward goal Pt will Ambulate: 51 - 150 feet;with least restrictive assistive device;with mod assist  Visit Information  Last PT Received On: 05/29/12 Assistance Needed: +2    Subjective Data  Subjective: "Not to hot" in response to How do you feel   Cognition  Overall Cognitive Status: Impaired Area of Impairment: Memory;Awareness of deficits;Problem solving Arousal/Alertness: Awake/alert  Orientation Level: Disoriented to;Situation Behavior During Session: [facility] for tasks performed Memory:  (Pt unable to recall reason for admittance to hospital. ) Awareness of Deficits: Pt with decreased Rt side awareness.  Requires cues to correct LOB to Rt side.   Problem Solving: Pt requires max directional cues for sequencing & technique.      Balance   Static Sitting Balance Static Sitting - Balance Support: Feet supported;Left upper extremity supported Static Sitting - Level of Assistance: 5: Stand by assistance Static Sitting - Comment/# of Minutes: Pt with improved balance as evident in ability to sit EOB with SBA however still has tendency to lean to Rt occassionally- required min (A) to correct.  Pt performed leaning forwards<>backwards.   Static Standing Balance Static Standing - Balance Support: Bilateral upper extremity supported Static Standing - Level of Assistance: 1: +2 Total assist (pt= 70%) Static Standing - Comment/# of Minutes: Cues for tall posture.    End of Session PT - End of Session Equipment Utilized During Treatment: Gait belt Activity Tolerance: Patient tolerated treatment well Patient left: in chair;with call bell/phone within reach     [place], [place] [phone] 05/29/2012

## 2012-05-29 NOTE — Evaluation (Signed)
Occupational Therapy Evaluation Patient Details Name: Eric Scott MRN: [id number] DOB: [birth year] Today's Date: 05/29/2012 Time: [id number] OT Time Calculation (min): 54 min  OT Assessment / Plan / Recommendation Clinical Impression  61 yo male admitted with right-sided weakness and altered mental status. 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 Pt could greatly benefit from CIR admission. Pt would be a great candidate for diners clud in addition to OT needs.    OT Assessment  Patient needs continued OT Services    Follow Up Recommendations  CIR    Barriers to Discharge   lives with a friend that is always home per patient  Equipment Recommendations  3 in 1 bedside comode;Wheelchair (measurements OT);Wheelchair cushion (measurements OT)    Recommendations for Other Services Rehab consult  Frequency  Min 2X/week    Precautions / Restrictions Precautions Precautions: Fall Restrictions Weight Bearing Restrictions: No   Pertinent Vitals/Pain Pain in bil feet    ADL  Eating/Feeding: Moderate assistance (provided red foam elevated surface and pillow positioning) Where Assessed - Eating/Feeding: Chair Grooming: Moderate assistance;Wash/dry face Where Assessed - Grooming: Supported sitting Upper Body Dressing: Maximal assistance Where Assessed - Upper Body Dressing: Supported sitting Lower Body Dressing: Maximal assistance Where Assessed - Lower Body Dressing: Supported sitting Toilet Transfer: +2 Total assistance Toilet Transfer: Patient Percentage: 60% Toilet Transfer Method: Sit to Loss adjuster, chartered: Raised toilet seat with arms (or 3-in-1 over toilet) Equipment Used: Gait belt (hand held (A)) Transfers/Ambulation Related to ADLs: Pt  required v/c and visual cue for upright posture. Pt following single commands. Pt side stepping to left side with increased time. ADL Comments: Pt supine on arrival. Pt oriented to place, month, DOB however unable to describe situation and reason for admission. Pt sitting Eob with right lean. Pt provided facilitation at trunk for upright posture. Pt is able to perform hip flexion and extension during session controlling eob sitting with min (A) once. Pt stand pivot to chair for upright sitting self feeding. Pt with decreased motor control of right UE. Pt provided red foam for spoon. P attempting to use Rt UE and needs Lt UE to steady right hand. Pt requires cueing and (A) for all self feeding.    OT Diagnosis: Generalized weakness;Cognitive deficits;Paresis (Rt side affected)  OT Problem List: Decreased strength;Decreased activity tolerance;Impaired balance (sitting and/or standing);Decreased safety awareness;Decreased knowledge of use of DME or AE;Decreased knowledge of precautions;Decreased cognition;Impaired UE functional use;Impaired sensation OT Treatment Interventions: Self-care/ADL training;Therapeutic exercise;Neuromuscular education;DME and/or AE instruction;Therapeutic activities;Cognitive remediation/compensation;Patient/family education;Balance training   OT Goals Acute Rehab OT Goals OT Goal Formulation: With patient Time For Goal Achievement: 06/12/12 Potential to Achieve Goals: Good ADL Goals Pt Will Perform Eating: with modified independence;Sitting, chair;with adaptive utensils ADL Goal: Eating - Progress: Goal set today Pt Will Perform Grooming: with min assist;Sitting, chair;Supported;with adaptive equipment ADL Goal: Grooming - Progress: Goal set today Miscellaneous OT Goals Miscellaneous OT Goal #1: Pt will complete bed mobility min (A) as precursor to adls. OT Goal: Miscellaneous Goal #1 - Progress: Goal set today Miscellaneous OT Goal #2: Pt will sit unsupported at EOB for  ~5 minutes supervision level as precursor to basic transfer OT Goal: Miscellaneous Goal #2 - Progress: Goal set today  Visit Information  Last OT Received On: 05/29/12 Assistance Needed: +2 PT/OT Co-Evaluation/Treatment: Yes  Subjective Data  Subjective: "it feels like diabetic neuropathy"- pt reporting pain in bil feet at arch Patient Stated Goal: none stated at this time   Prior [place] With: Friend(s) Available Help at Discharge: Friend(s) Type of Home: Mobile home Home Access: Stairs to enter;Ramped entrance Home Layout: One level Bathroom Shower/Tub: Product/process development scientist: Standard Home Adaptive Equipment: Straight cane Prior Function Level of Independence: Independent with assistive device(s) Able to Take Stairs?: Yes Driving: Yes Vocation: On disability Communication Communication: Expressive difficulties (delayed answer) Dominant Hand: Right         Vision/Perception     Cognition  Overall Cognitive Status: Impaired Area of Impairment: Memory;Awareness of deficits;Problem solving Arousal/Alertness: Awake/alert Orientation Level: Disoriented to;Situation Behavior During Session: Flat affect Current Attention Level: Sustained Memory:  (unable to recall reason in hospital) Memory Deficits: Pt is unable to remember reason for admission or test results Awareness of Deficits: Pt with decreased Rt side awareness.  Requires cues to correct LOB to Rt side.   Problem Solving: Pt requires max directional cues for sequencing & technique.      Extremity/Trunk Assessment Right Upper Extremity Assessment RUE ROM/Strength/Tone: Deficits RUE ROM/Strength/Tone Deficits: AROM hand delayed Brunstrom V strength 4 out 5, AROM elbow Brunstrom V, AROM at shoulder Brunstrom III pt has decreased control at shoulder. Pt is unable to complete shoulder flexion > than 20 degrees RUE Sensation: History of peripheral neuropathy RUE  Coordination: Deficits RUE Coordination Deficits: tremor decreased control Left Upper Extremity Assessment LUE ROM/Strength/Tone: Within functional levels LUE Sensation: History of peripheral neuropathy LUE Coordination: WFL - gross/fine motor Trunk Assessment Trunk Assessment: Normal     Mobility Bed Mobility Bed Mobility: Supine to Sit;Sitting - Scoot to Edge of Bed Rolling Left: 1: +1 Total assist;With rail Left Sidelying to Sit: 1: +2 Total assist;With rails;HOB flat Left Sidelying to Sit: Patient Percentage: 40% Sitting - Scoot to Edge of Bed: 2: Max assist Details for Bed Mobility Assistance: Pt log rolling to Lt side with max directional cues & total (A). Pt demonstrates trunk activation adn neck rotation to left side. Pt grabbing rail with Lt UE and pushing up on Lt elbow. Pt sitting EOB with (A) initially Transfers Transfers: Sit to Stand;Stand to Sit Sit to Stand: 1: +2 Total assist;With upper extremity assist;From bed Sit to Stand: Patient Percentage: 40% Stand to Sit: 1: +2 Total assist;With upper extremity assist;With armrests;To chair/3-in-1 Stand to Sit: Patient Percentage: 60% Details for Transfer Assistance: cues for sequence and motor planning, pt with hip flexion and cues for upright posture . Pt able to control BIL LE during transfer no buckling noted at this time.      Shoulder Instructions     Exercise     Balance Static Sitting Balance Static Sitting - Balance Support: Feet supported;Left upper extremity supported Static Sitting - Level of Assistance: 5: Stand by assistance Static Sitting - Comment/# of Minutes: sitting ~5 minutes with increased stability with prolonged sitting. Pt demonstrated decreased right lean iwth cueing .Pt demonstrates some awareness of Rt lean when given questioning cues. Pt however does not correct right lean unless prompted Static Standing Balance Static Standing - Balance Support: Bilateral upper extremity supported Static  Standing - Level of Assistance: 1: +2 Total assist Static Standing - Comment/# of Minutes: Cues for tall posture.     End of Session OT - End of Session Activity Tolerance: Patient tolerated treatment well Patient left: in chair;with call bell/phone within reach  Nurse Communication: Mobility status;Precautions (Needs (A) for all meals)  GO     Sharol Harness [facility] 05/29/2012, 8:45 AM Pager: [phone]

## 2012-05-29 NOTE — Progress Notes (Signed)
OT NOTE  Pt will need (A) for all meals with setup, red foam for spoon and intermittent supervision / assistance. Pt has strong Right lean and requires pillow positioning in chair to help facilitate self feeding.   RN and tech noted s/p evaluation   Veneda Melter   OTR/L Pager: [phone] Office: [phone] .

## 2012-05-29 NOTE — Consult Note (Signed)
WOC consult Note Reason for Consult: eval laceration of the right 5th toe, however upon my assessment he has laceration of the right 4th toe, skin tear with some sanguinous fluid collected under the skin flap.  I was able to drain the fluid from under the skin flap and this area does not appear to be infected or fluctuant. Wound type: skin tear, right 4th toe Measurement:1.0cm x 1.0cmx 0.2cm  Wound bed: pink and moist Drainage (amount, consistency, odor) serous expressed from under the skin flap Periwound:intact without problems Dressing procedure/placement/frequency: would recommend to wrap the toe with Vaseline gauze will serve to insulate and will not adhere to the wound bed, weave gauze between toes to hold in place.   Re consult if needed, will not follow at this time. Thanks  Breckin Zafar Kellogg, [place] [phone])

## 2012-05-29 NOTE — Consult Note (Signed)
Physical Medicine and Rehabilitation Consult Reason for Consult: CVA Referring Physician: Teaching service   HPI: Eric Scott is a 61 y.o. right-handed male with history of type 2 diabetes mellitus and tobacco abuse as well as hypertension. Admitted 05/27/2012 with right-sided weakness and altered mental status. 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 MRA of the head with moderate small vessel disease moderate proximal stenosis of the right P1 segment without any other significant proximal stenosis or aneurysm. Echocardiogram with ejection fraction of 60% and normal systolic function. Carotid Dopplers with no ICA stenosis. Maintained on aspirin therapy for stroke prophylaxis as well as subcutaneous Lovenox for DVT prophylaxis. A NicoDerm patch was introduced for history of tobacco abuse. There was report of patient with history of fecal incontinence x6 months and worsening lower extremity pain over the last 1-1/2 years workup currently ongoing with order for MRI lumbar spine. Physical therapy evaluation completed an ongoing with recommendations of physical medicine rehabilitation consult to consider inpatient rehabilitation services.   Review of Systems  Gastrointestinal:       Bowel incontinence  Musculoskeletal: Positive for myalgias.   Past Medical History  Diagnosis Date  . Hypertension   . Hypercholesteremia   . Type II diabetes mellitus   . Diabetic peripheral neuropathy     "chronic" (05/27/2012)  . Stroke 05/2012    Subacute, lacunar infarcts within the left basal ganglia and posterior limp of the left internal capsule/thalamus; "RUE; both feet weak" (05/27/2012)  . Anxiety    Past Surgical History  Procedure Date  . Hernia repair 01/05/2004    "belly  button" (05/27/2012)   History reviewed. No pertinent family history. Social History:  reports that he has been smoking Cigarettes.  He has a 90 pack-year smoking history. He has never used smokeless tobacco. He reports that he does not drink alcohol or use illicit drugs. Allergies:  Allergies  Allergen Reactions  . Flexeril (Cyclobenzaprine Hcl) Other (See Comments)    "whole body  Tremors" (05/27/2012)   No prescriptions prior to admission    Home: Home Living Lives With: Friend(s) Available Help at Discharge: Friend(s) Type of Home: Mobile home Home Access: Stairs to enter;Ramped entrance Home Layout: One level Home Adaptive Equipment: Straight cane  Functional History: Prior Function Vocation: On disability Functional Status:  Mobility: Bed Mobility Bed Mobility: Sit to Supine;Supine to Sit Supine to Sit: 2: Max assist Sit to Supine: 1: +2 Total assist Sit to Supine: Patient Percentage: 30% Transfers Transfers: Stand to Sit;Sit to Stand Sit to Stand: From bed;3: Mod assist;1: +2 Total assist (first attempt with less assist, then nurse in room to assist) Sit to Stand: Patient Percentage: 40% Stand to Sit: 3: Mod assist;To bed;With upper extremity assist Ambulation/Gait Ambulation/Gait Assistance: 2: Max assist Ambulation Distance (Feet): 2 Feet Assistive device: Rolling walker Ambulation/Gait Assistance Details: ambulated forward about 2 steps, then backwards to bed, secondary to severe right lean on PT when lifting right foot to move for ambulation. Gait Pattern: Lateral trunk lean to right    ADL:    Cognition: Cognition Overall Cognitive Status: Impaired Arousal/Alertness: Awake/alert Orientation Level: Oriented X4 Attention: Sustained Sustained Attention: Impaired Sustained Attention Impairment: Verbal basic;Functional basic Memory: Impaired Memory Impairment: Storage deficit;Retrieval deficit;Decreased recall of new information;Decreased short term  memory;Prospective memory Awareness: Impaired Awareness Impairment: Anticipatory impairment;Intellectual  impairment;Emergent impairment Problem Solving: Impaired Problem Solving Impairment: Verbal basic;Functional basic Executive Function: Self Monitoring;Self Correcting Self Correcting: Impaired Self Correcting Impairment: Verbal basic;Functional basic Safety/Judgment: Impaired Cognition Overall Cognitive Status: Impaired Area of Impairment: Attention;Awareness of deficits Arousal/Alertness: Awake/alert Behavior During Session: [facility] for tasks performed Current Attention Level: Sustained Attention - Other Comments: needs multiple cues to correct loss of balance, able to attend to mobility task for approx 3 min with cues for safety Awareness of Deficits: attempting to walk despite severe balance deficit and poor body position awareness  Blood pressure 138/66, pulse 39, temperature 98.2 F (36.8 C), temperature source Oral, resp. rate 18, SpO2 97.00%. Physical Exam  Vitals reviewed. HENT:  Head: Normocephalic.  Eyes:       Pupils round and reactive to light  Neck: Neck supple. No thyromegaly present.  Cardiovascular: Normal rate and regular rhythm.   Pulmonary/Chest: Effort normal and breath sounds normal. He has no wheezes.  Abdominal: Soft. Bowel sounds are normal. There is no tenderness.  Musculoskeletal: He exhibits no edema.  Neurological: He is alert.       Patient is a poor historian. He was able to state his appropriate age and date of birth. He could not name the hospital or situation. He did follow simple commands. Very distracted. Affect flat. RUE is 1-2/5 but inconsistent. RLE was trace to 0/5 but inconsistent due to his distractibility. Sensation 1/2 Right arm,leg. Infrequently makes eye contact.   Skin: Skin is warm and dry.    Results for orders placed during the hospital encounter of 05/27/12 (from the past 24 hour(s))  GLUCOSE, CAPILLARY     Status: Normal    Collection Time   05/28/12  6:35 AM      Component Value Range   Glucose-Capillary 82  70 - 99 mg/dL  BASIC METABOLIC PANEL     Status: Abnormal   Collection Time   05/28/12  8:35 AM      Component Value Range   Sodium 143  135 - 145 mEq/L   Potassium 3.6  3.5 - 5.1 mEq/L   Chloride 106  96 - 112 mEq/L   CO2 26  19 - 32 mEq/L   Glucose, Bld 148 (*) 70 - 99 mg/dL   BUN 9  6 - 23 mg/dL   Creatinine, Ser 0.83  0.50 - 1.35 mg/dL   Calcium 8.6  8.4 - 10.5 mg/dL   GFR calc non Af Amer >90  >90 mL/min   GFR calc Af Amer >90  >90 mL/min  TSH     Status: Normal   Collection Time   05/28/12  8:35 AM      Component Value Range   TSH 1.157  0.350 - 4.500 uIU/mL  VITAMIN B12     Status: Normal   Collection Time   05/28/12  8:35 AM      Component Value Range   Vitamin B-12 573  211 - 911 pg/mL  RPR     Status: Normal   Collection Time   05/28/12  8:35 AM      Component Value Range   RPR NON REACTIVE  NON REACTIVE  HIV ANTIBODY (ROUTINE TESTING)     Status: Normal   Collection Time   05/28/12  8:35 AM      Component Value Range   HIV NON REACTIVE  NON REACTIVE  FOLATE     Status: Normal   Collection Time   05/28/12  8:35 AM      Component  Value Range   Folate 10.8    GLUCOSE, CAPILLARY     Status: Abnormal   Collection Time   05/28/12 12:20 PM      Component Value Range   Glucose-Capillary 153 (*) 70 - 99 mg/dL   Comment 1 Documented in Chart     Comment 2 Notify RN    GLUCOSE, CAPILLARY     Status: Normal   Collection Time   05/28/12  5:10 PM      Component Value Range   Glucose-Capillary 96  70 - 99 mg/dL   Comment 1 Documented in Chart     Comment 2 Notify RN    GLUCOSE, CAPILLARY     Status: Abnormal   Collection Time   05/28/12 11:26 PM      Component Value Range   Glucose-Capillary 136 (*) 70 - 99 mg/dL   Comment 1 Notify RN     Dg Chest 2 View  05/27/2012  *RADIOLOGY REPORT*  Clinical Data: Body weakness  CHEST - 2 VIEW  Comparison: None.  Findings:  Examination is degraded secondary to patient body habitus.  Enlarged cardiac silhouette and mediastinal contours. There is mild diffuse thickening of the pulmonary interstitium. Mild symmetric bibasilar heterogeneous opacities.  Asymmetric heterogeneous opacities are seen within the right mid lung.  No pleural effusion or pneumothorax.  No acute osseous abnormalities.  IMPRESSION: 1.  Enlarged cardiac silhouette.  Further evaluation of cardiac echo may performed as clinically indicated. 2.  Possible airspace opacity within the right mid lung may represent an area of developing infection.  Continued attention on follow-up is recommended.  3.  Symmetric bibasilar opacities are favored to represent atelectasis.   Original Report Authenticated By: Jake Seats, MD    Ct Head Wo Contrast  05/27/2012  *RADIOLOGY REPORT*  Clinical Data: Right-sided weakness, unsteady gait, right-sided facial droop, last seen normal 3 days ago.  CT HEAD WITHOUT CONTRAST  Technique:  Contiguous axial images were obtained from the base of the skull through the vertex without contrast.  Comparison: None.  Findings:  There is a geographic slightly ill-defined area of decreased attenuation within the posterior limb of the left internal capsule extending to involve the left thalamus (images 13 and 14).  There is an additional geographic linear area of decreased attenuation within the more anterior aspect of the left basal ganglia (images 11-13).  Grossly symmetric periventricular hypodensities are seen about the occipital horn of the bilateral lateral ventricles compatible with microvascular ischemic disease.  No intraparenchymal or extra-axial mass or hemorrhage.  Normal size and configuration of the ventricles and basilar cisterns.  No midline shift.  There is minimal opacification within the left anterior ethmoidal air cells.  Remaining paranasal sinuses and mastoid air cells are normally aerated.  Regional soft tissues are normal.  No  displaced calvarial fracture.  IMPRESSION: 1.  Age indeterminate, likely at least subacute, lacunar infarcts within the left basal ganglia and posterior limb of the left internal capsule/thalamus.  No intraparenchymal or extra-axial hemorrhage.  Further evaluation with MRI may be performed as clinically indicated.  2.  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   Original Report Authenticated By: Jake Seats, MD    Mr Brain Wo Contrast  05/28/2012  *RADIOLOGY REPORT*  Clinical Data:  Stroke.  Several day history of right-sided weakness.  Abnormal CT scan of the head.  MRI HEAD WITHOUT CONTRAST MRA HEAD WITHOUT CONTRAST  Technique:  Multiplanar, multiecho pulse sequences of the brain and surrounding structures were  obtained without intravenous contrast. Angiographic images of the head were obtained using MRA technique without contrast.  Comparison:  CT head without contrast 05/27/2012.  MRI HEAD  Findings:  The diffusion weighted images confirm an acute non hemorrhagic infarct along the posterior limb of the left internal capsule.  There may be some involvement of the left lateral thalamus. A slightly older subacute non hemorrhagic infarct is evident within the anterior left external capsule with some involvement of the caudate head and lentiform nucleus.  Mild generalized atrophy is present.  There is extensive periventricular subcortical white matter change bilaterally.  The perivascular spaces are dilated. A more remote lacunar infarct is present within the left thalamus.  Flow is present in the major intracranial arteries.  The globes and orbits are intact.  Mild mucosal thickening is present in the anterior ethmoid air cells bilaterally.  The paranasal sinuses and mastoid air cells are otherwise clear.  IMPRESSION:  1.  Acute non hemorrhagic infarct involving the posterior limb of the left internal capsule and portions of the lateral thalamus. 2.  Slightly older subacute non hemorrhagic infarct involving  the anterior left external capsule with slight involvement of the left caudate head and anterior lentiform nucleus. 3.  Remote lacunar infarct of the left thalamus. 4.  Atrophy and extensive white matter disease.  This likely reflects the sequelae of chronic microvascular ischemia.  MRA HEAD  Findings: The study is mildly degraded by patient motion.  The internal carotid arteries are within normal limits from high cervical segments through the ICA termini bilaterally.  Accounting for motion, the A1 and M1 segments are normal.  There is some attenuation of distal MCA branch vessels bilaterally without a significant proximal stenosis or occlusion.  The vertebral arteries are codominant.  The PICA origins are visualized and within normal limits bilaterally.  The basilar artery is within normal limits.  There is a moderate proximal stenosis of the right P1 segment.  Distal small vessel attenuation is present bilaterally.  IMPRESSION:  1.  Moderate small vessel disease. The acute or subacute infarcts are both within the territory of perforators, compatible with small vessel disease. 2.  Moderate proximal stenosis of the right P1 segment. 3.  No other significant proximal stenosis, aneurysm, or branch vessel occlusion.   Original Report Authenticated By: San Morelle, M.D.    Mr Mra Head/brain Wo Cm  05/28/2012  *RADIOLOGY REPORT*  Clinical Data:  Stroke.  Several day history of right-sided weakness.  Abnormal CT scan of the head.  MRI HEAD WITHOUT CONTRAST MRA HEAD WITHOUT CONTRAST  Technique:  Multiplanar, multiecho pulse sequences of the brain and surrounding structures were obtained without intravenous contrast. Angiographic images of the head were obtained using MRA technique without contrast.  Comparison:  CT head without contrast 05/27/2012.  MRI HEAD  Findings:  The diffusion weighted images confirm an acute non hemorrhagic infarct along the posterior limb of the left internal capsule.  There may be  some involvement of the left lateral thalamus. A slightly older subacute non hemorrhagic infarct is evident within the anterior left external capsule with some involvement of the caudate head and lentiform nucleus.  Mild generalized atrophy is present.  There is extensive periventricular subcortical white matter change bilaterally.  The perivascular spaces are dilated. A more remote lacunar infarct is present within the left thalamus.  Flow is present in the major intracranial arteries.  The globes and orbits are intact.  Mild mucosal thickening is present in the anterior ethmoid air cells  bilaterally.  The paranasal sinuses and mastoid air cells are otherwise clear.  IMPRESSION:  1.  Acute non hemorrhagic infarct involving the posterior limb of the left internal capsule and portions of the lateral thalamus. 2.  Slightly older subacute non hemorrhagic infarct involving the anterior left external capsule with slight involvement of the left caudate head and anterior lentiform nucleus. 3.  Remote lacunar infarct of the left thalamus. 4.  Atrophy and extensive white matter disease.  This likely reflects the sequelae of chronic microvascular ischemia.  MRA HEAD  Findings: The study is mildly degraded by patient motion.  The internal carotid arteries are within normal limits from high cervical segments through the ICA termini bilaterally.  Accounting for motion, the A1 and M1 segments are normal.  There is some attenuation of distal MCA branch vessels bilaterally without a significant proximal stenosis or occlusion.  The vertebral arteries are codominant.  The PICA origins are visualized and within normal limits bilaterally.  The basilar artery is within normal limits.  There is a moderate proximal stenosis of the right P1 segment.  Distal small vessel attenuation is present bilaterally.  IMPRESSION:  1.  Moderate small vessel disease. The acute or subacute infarcts are both within the territory of perforators, compatible  with small vessel disease. 2.  Moderate proximal stenosis of the right P1 segment. 3.  No other significant proximal stenosis, aneurysm, or branch vessel occlusion.   Original Report Authenticated By: San Morelle, M.D.     Assessment/Plan: Diagnosis: left PLIC infarct 1. Does the need for close, 24 hr/day medical supervision in concert with the patient's rehab needs make it unreasonable for this patient to be served in a less intensive setting? Yes 2. Co-Morbidities requiring supervision/potential complications: dm2, htn 3. Due to bladder management, bowel management, safety, skin/wound care, disease management, medication administration, pain management and patient education, does the patient require 24 hr/day rehab nursing? Yes 4. Does the patient require coordinated care of a physician, rehab nurse, PT (1-2 hrs/day, 5 days/week), OT (1-2 hrs/day, 5 days/week) and SLP (1-2 hrs/day, 5 days/week) to address physical and functional deficits in the context of the above medical diagnosis(es)? Yes Addressing deficits in the following areas: balance, endurance, locomotion, strength, transferring, bowel/bladder control, bathing, dressing, feeding, grooming, toileting, cognition, speech and psychosocial support 5. Can the patient actively participate in an intensive therapy program of at least 3 hrs of therapy per day at least 5 days per week? Yes 6. The potential for patient to make measurable gains while on inpatient rehab is good 7. Anticipated functional outcomes upon discharge from inpatient rehab are supervision to minimal assist with PT,  Minimal assist with OT, supervision with SLP. 8. Estimated rehab length of stay to reach the above functional goals is: 2-3 weeks 9. Does the patient have adequate social supports to accommodate these discharge functional goals? Yes and Potentially 10. Anticipated D/C setting: Home 11. Anticipated post D/C treatments: [place] therapy 12. Overall Rehab/Functional  Prognosis: good  RECOMMENDATIONS: This patient's condition is appropriate for continued rehabilitative care in the following setting: CIR Patient has agreed to participate in recommended program. Yes Note that insurance prior authorization may be required for reimbursement for recommended care.  Comment:Rehab RN to follow up.   Oval Linsey, MD     05/29/2012

## 2012-05-29 NOTE — Progress Notes (Signed)
INITIAL NUTRITION ASSESSMENT  DOCUMENTATION CODES Per approved criteria  -Not Applicable   INTERVENTION:  Please obtain current weight  Ensure Complete supplement twice daily between meals (350 kcals, 13 gm protein per 8 fl oz bottle) RD to follow for nutrition care plan  NUTRITION DIAGNOSIS: Inadequate oral intake related to acute CVA as evidenced by no % meal records available  Goal: Oral intake with meals & supplements to meet >/= 90% of estimated nutrition needs  Monitor:  PO & supplemental intake, weight, labs, I/O's  Reason for Assessment: Malnutrition Screening Tool Report  61 y.o. male  Admitting Dx: Acute lacunar stroke  ASSESSMENT: Patient admitted with right-sided weakness and altered mental status; MRI of brain showed acute nonhemorrhagic infarction; RD unable to obtain nutrition history; RD attempted to call patient's wife, however, home phone number disconnected.  Per nutrition screen, patient has lost ~ 100 lb in the last 9 months; no % PO intake intake recorded in flowsheet records; per PT note, patient will need assistance with tray set-up and feeding; CWOCN note reviewed for toe laceration; would benefit from addition of supplements -- RD to order.  Height: 6' 0 (182.8 cm)  Weight: 97 kg (213 lb) -- July 2012; no current weight available  Ideal Body Weight: 81 kg  % Ideal Body Weight: 119%  Wt Readings from Last 10 Encounters:  No data found for Wt    Usual Body Weight: unable to obtain  % Usual Body Weight: ---  BMI:  unable to calculate   Estimated Nutritional Needs: Kcal: 2000-2200 Protein: 100-110 gm Fluid: 2.0-2.2 L  Skin: laceration of the right 4th toe  Diet Order: Cardiac  EDUCATION NEEDS: -No education needs identified at this time   Intake/Output Summary (Last 24 hours) at 05/29/12 1504 Last data filed at 05/29/12 0200  Gross per 24 hour  Intake      0 ml  Output   1000 ml  Net  -1000 ml    Labs:   Lab 05/28/12  0835 05/27/12 0648  NA 143 141  K 3.6 3.3*  CL 106 103  CO2 26 26  BUN 9 9  CREATININE 0.83 0.85  CALCIUM 8.6 9.1  MG -- --  PHOS -- --  GLUCOSE 148* 140*    CBG (last 3)   Basename 05/29/12 0717 05/28/12 2326 05/28/12 1710  GLUCAP 124* 136* 96    Scheduled Meds:   . aspirin  300 mg Rectal Daily   Or  . aspirin  325 mg Oral Daily  . enoxaparin (LOVENOX) injection  40 mg Subcutaneous Q24H  . hydrochlorothiazide  12.5 mg Oral Daily  . metroNIDAZOLE  500 mg Oral Q8H  . nicotine  21 mg Transdermal Daily  . simvastatin  10 mg Oral q1800    Continuous Infusions:   . sodium chloride 100 mL/hr at 05/27/12 1330    Past Medical History  Diagnosis Date  . Hypertension   . Hypercholesteremia   . Type II diabetes mellitus   . Diabetic peripheral neuropathy     "chronic" (05/27/2012)  . Stroke 05/2012    Subacute, lacunar infarcts within the left basal ganglia and posterior limp of the left internal capsule/thalamus; "RUE; both feet weak" (05/27/2012)  . Anxiety     Past Surgical History  Procedure Date  . Hernia repair 01/05/2004    "belly button" (05/27/2012)    Phillips Odor, RD, [place] Pager #: [phone] After-Hours Pager #: [phone]

## 2012-05-30 ENCOUNTER — Encounter (HOSPITAL_COMMUNITY): Payer: Self-pay | Admitting: Internal Medicine

## 2012-05-30 ENCOUNTER — Inpatient Hospital Stay (HOSPITAL_COMMUNITY)
Admission: RE | Admit: 2012-05-30 | Discharge: 2012-06-16 | DRG: 945 | Disposition: A | Discharge status: Skilled Nursing Facility | Payer: Medicare Other | Source: Intra-hospital | Attending: Physical Medicine & Rehabilitation | Admitting: Physical Medicine & Rehabilitation

## 2012-05-30 DIAGNOSIS — R4789 Other speech disturbances: Secondary | ICD-10-CM

## 2012-05-30 DIAGNOSIS — I10 Essential (primary) hypertension: Secondary | ICD-10-CM

## 2012-05-30 DIAGNOSIS — E1142 Type 2 diabetes mellitus with diabetic polyneuropathy: Secondary | ICD-10-CM

## 2012-05-30 DIAGNOSIS — S91119A Laceration without foreign body of unspecified toe without damage to nail, initial encounter: Secondary | ICD-10-CM | POA: Diagnosis present

## 2012-05-30 DIAGNOSIS — Z5189 Encounter for other specified aftercare: Principal | ICD-10-CM

## 2012-05-30 DIAGNOSIS — R29898 Other symptoms and signs involving the musculoskeletal system: Secondary | ICD-10-CM

## 2012-05-30 DIAGNOSIS — F411 Generalized anxiety disorder: Secondary | ICD-10-CM

## 2012-05-30 DIAGNOSIS — E876 Hypokalemia: Secondary | ICD-10-CM

## 2012-05-30 DIAGNOSIS — I639 Cerebral infarction, unspecified: Secondary | ICD-10-CM

## 2012-05-30 DIAGNOSIS — G8929 Other chronic pain: Secondary | ICD-10-CM | POA: Diagnosis present

## 2012-05-30 DIAGNOSIS — A0472 Enterocolitis due to Clostridium difficile, not specified as recurrent: Secondary | ICD-10-CM

## 2012-05-30 DIAGNOSIS — I6330 Cerebral infarction due to thrombosis of unspecified cerebral artery: Secondary | ICD-10-CM

## 2012-05-30 DIAGNOSIS — R29810 Facial weakness: Secondary | ICD-10-CM

## 2012-05-30 DIAGNOSIS — E1149 Type 2 diabetes mellitus with other diabetic neurological complication: Secondary | ICD-10-CM

## 2012-05-30 DIAGNOSIS — E785 Hyperlipidemia, unspecified: Secondary | ICD-10-CM

## 2012-05-30 DIAGNOSIS — E7800 Pure hypercholesterolemia, unspecified: Secondary | ICD-10-CM

## 2012-05-30 DIAGNOSIS — F17200 Nicotine dependence, unspecified, uncomplicated: Secondary | ICD-10-CM

## 2012-05-30 DIAGNOSIS — F329 Major depressive disorder, single episode, unspecified: Secondary | ICD-10-CM

## 2012-05-30 DIAGNOSIS — F4321 Adjustment disorder with depressed mood: Secondary | ICD-10-CM

## 2012-05-30 DIAGNOSIS — N390 Urinary tract infection, site not specified: Secondary | ICD-10-CM

## 2012-05-30 LAB — BASIC METABOLIC PANEL
BUN: 13 mg/dL (ref 6–23)
CO2: 25 mEq/L (ref 19–32)
Chloride: 100 mEq/L (ref 96–112)
GFR calc Af Amer: 89 mL/min — ABNORMAL LOW (ref >90)
Glucose, Bld: 197 mg/dL — ABNORMAL HIGH (ref 70–99)
Potassium: 2.9 mEq/L — ABNORMAL LOW (ref 3.5–5.1)
Sodium: 141 mEq/L (ref 135–145)

## 2012-05-30 LAB — GLUCOSE, CAPILLARY: Glucose-Capillary: 165 mg/dL — ABNORMAL HIGH (ref 70–99)

## 2012-05-30 MED ORDER — POTASSIUM CHLORIDE CRYS ER 20 MEQ PO TBCR: 40.0000 meq | EXTENDED_RELEASE_TABLET | ORAL | Status: DC

## 2012-05-30 MED ORDER — NICOTINE 21 MG/24HR TD PT24
21.0000 mg | MEDICATED_PATCH | Freq: Every day | TRANSDERMAL | Status: DC
  Administered 2012-05-31 – 2012-06-16 (×17): 21 mg via TRANSDERMAL
  Filled 2012-05-30 (×18): qty 1

## 2012-05-30 MED ORDER — METRONIDAZOLE 500 MG PO TABS: 500.0000 mg | ORAL_TABLET | Freq: Three times a day (TID) | ORAL | Status: AC

## 2012-05-30 MED ORDER — HYDROCHLOROTHIAZIDE 12.5 MG PO CAPS
12.5000 mg | ORAL_CAPSULE | Freq: Every day | ORAL | Status: DC
  Administered 2012-05-31 – 2012-06-02 (×3): 12.5 mg via ORAL
  Filled 2012-05-30 (×5): qty 1

## 2012-05-30 MED ORDER — ASPIRIN 325 MG PO TABS
325.0000 mg | ORAL_TABLET | Freq: Every day | ORAL | Status: DC
  Administered 2012-05-31 – 2012-06-16 (×17): 325 mg via ORAL
  Filled 2012-05-30 (×18): qty 1

## 2012-05-30 MED ORDER — SIMVASTATIN 10 MG PO TABS
10.0000 mg | ORAL_TABLET | Freq: Every day | ORAL | Status: DC
  Administered 2012-05-30 – 2012-06-15 (×17): 10 mg via ORAL
  Filled 2012-05-30 (×18): qty 1

## 2012-05-30 MED ORDER — HYDROCODONE-ACETAMINOPHEN 5-325 MG PO TABS
1.0000 | ORAL_TABLET | ORAL | Status: DC | PRN
  Administered 2012-05-31: 2 via ORAL
  Administered 2012-05-31: 1 via ORAL
  Administered 2012-05-31 – 2012-06-16 (×32): 2 via ORAL
  Filled 2012-05-30: qty 1
  Filled 2012-05-30 (×20): qty 2
  Filled 2012-05-30: qty 1
  Filled 2012-05-30 (×13): qty 2

## 2012-05-30 MED ORDER — ONDANSETRON HCL 4 MG/2ML IJ SOLN: 4.0000 mg | Freq: Four times a day (QID) | INTRAMUSCULAR | Status: DC | PRN

## 2012-05-30 MED ORDER — INSULIN ASPART 100 UNIT/ML ~~LOC~~ SOLN
0.0000 [IU] | Freq: Three times a day (TID) | SUBCUTANEOUS | Status: DC
  Administered 2012-05-31: 3 [IU] via SUBCUTANEOUS
  Administered 2012-05-31: 2 [IU] via SUBCUTANEOUS
  Administered 2012-05-31 – 2012-06-01 (×2): 3 [IU] via SUBCUTANEOUS
  Administered 2012-06-01 – 2012-06-02 (×3): 5 [IU] via SUBCUTANEOUS
  Administered 2012-06-02: 2 [IU] via SUBCUTANEOUS
  Administered 2012-06-03: 10:00:00 via SUBCUTANEOUS
  Administered 2012-06-03: 8 [IU] via SUBCUTANEOUS
  Administered 2012-06-03: 2 [IU] via SUBCUTANEOUS
  Administered 2012-06-04: 15 [IU] via SUBCUTANEOUS
  Administered 2012-06-04: 3 [IU] via SUBCUTANEOUS
  Administered 2012-06-04 – 2012-06-05 (×2): 5 [IU] via SUBCUTANEOUS
  Administered 2012-06-05: 3 [IU] via SUBCUTANEOUS
  Administered 2012-06-05 – 2012-06-06 (×2): 5 [IU] via SUBCUTANEOUS
  Administered 2012-06-06: 8 [IU] via SUBCUTANEOUS
  Administered 2012-06-07 – 2012-06-09 (×5): 3 [IU] via SUBCUTANEOUS
  Administered 2012-06-09: 2 [IU] via SUBCUTANEOUS
  Administered 2012-06-09: 3 [IU] via SUBCUTANEOUS
  Administered 2012-06-10: 5 [IU] via SUBCUTANEOUS
  Administered 2012-06-11: 3 [IU] via SUBCUTANEOUS
  Administered 2012-06-11 – 2012-06-12 (×2): 2 [IU] via SUBCUTANEOUS
  Administered 2012-06-12 – 2012-06-13 (×2): 5 [IU] via SUBCUTANEOUS
  Administered 2012-06-13: 2 [IU] via SUBCUTANEOUS
  Administered 2012-06-14: 3 [IU] via SUBCUTANEOUS
  Administered 2012-06-14 – 2012-06-16 (×4): 2 [IU] via SUBCUTANEOUS

## 2012-05-30 MED ORDER — ASPIRIN 325 MG PO TABS: 325.0000 mg | ORAL_TABLET | Freq: Every day | ORAL | Status: DC

## 2012-05-30 MED ORDER — PRAVASTATIN SODIUM 10 MG PO TABS: 10.0000 mg | ORAL_TABLET | Freq: Every day | ORAL | Status: DC

## 2012-05-30 MED ORDER — NICOTINE 21 MG/24HR TD PT24: 1.0000 | MEDICATED_PATCH | Freq: Every day | TRANSDERMAL | Status: DC

## 2012-05-30 MED ORDER — SORBITOL 70 % SOLN
30.0000 mL | Freq: Every day | Status: DC | PRN
  Filled 2012-05-30: qty 30

## 2012-05-30 MED ORDER — POTASSIUM CHLORIDE CRYS ER 20 MEQ PO TBCR: 40.0000 meq | EXTENDED_RELEASE_TABLET | Freq: Two times a day (BID) | ORAL | Status: DC

## 2012-05-30 MED ORDER — ONDANSETRON HCL 4 MG PO TABS: 4.0000 mg | ORAL_TABLET | Freq: Four times a day (QID) | ORAL | Status: DC | PRN

## 2012-05-30 MED ORDER — ENSURE COMPLETE PO LIQD
237.0000 mL | Freq: Two times a day (BID) | ORAL | Status: DC
  Administered 2012-05-31 – 2012-06-06 (×12): 237 mL via ORAL

## 2012-05-30 MED ORDER — ASPIRIN 300 MG RE SUPP
300.0000 mg | Freq: Every day | RECTAL | Status: DC
  Filled 2012-05-30 (×18): qty 1

## 2012-05-30 MED ORDER — METRONIDAZOLE 500 MG PO TABS
500.0000 mg | ORAL_TABLET | Freq: Three times a day (TID) | ORAL | Status: AC
  Administered 2012-05-30 – 2012-06-09 (×30): 500 mg via ORAL
  Filled 2012-05-30 (×33): qty 1

## 2012-05-30 MED ORDER — ENOXAPARIN SODIUM 40 MG/0.4ML ~~LOC~~ SOLN
40.0000 mg | SUBCUTANEOUS | Status: DC
  Administered 2012-05-31 – 2012-06-15 (×16): 40 mg via SUBCUTANEOUS
  Filled 2012-05-30 (×17): qty 0.4

## 2012-05-30 MED ORDER — HYDROCHLOROTHIAZIDE 12.5 MG PO CAPS: 12.5000 mg | ORAL_CAPSULE | Freq: Every day | ORAL | Status: DC

## 2012-05-30 MED ORDER — SIMVASTATIN 10 MG PO TABS: 10.0000 mg | ORAL_TABLET | Freq: Every day | ORAL | Status: DC

## 2012-05-30 MED ORDER — POTASSIUM CHLORIDE CRYS ER 20 MEQ PO TBCR
40.0000 meq | EXTENDED_RELEASE_TABLET | ORAL | Status: DC
  Filled 2012-05-30 (×3): qty 2

## 2012-05-30 MED ORDER — INSULIN ASPART 100 UNIT/ML ~~LOC~~ SOLN
0.0000 [IU] | Freq: Every day | SUBCUTANEOUS | Status: DC
  Administered 2012-06-01: 2 [IU] via SUBCUTANEOUS
  Administered 2012-06-03 – 2012-06-07 (×2): 3 [IU] via SUBCUTANEOUS
  Administered 2012-06-12: 2 [IU] via SUBCUTANEOUS

## 2012-05-30 MED ORDER — POLYETHYLENE GLYCOL 3350 17 G PO PACK
17.0000 g | PACK | Freq: Every day | ORAL | Status: DC | PRN
  Filled 2012-05-30: qty 1

## 2012-05-30 MED ORDER — ACETAMINOPHEN 325 MG PO TABS: 325.0000 mg | ORAL_TABLET | ORAL | Status: DC | PRN

## 2012-05-30 NOTE — Interval H&P Note (Signed)
Eric Scott was admitted today to Inpatient Rehabilitation with the diagnosis of left PLIC/thalamic thrombotic infarct.  The patient's history has been reviewed, patient examined, and there is no change in status.  Patient continues to be appropriate for intensive inpatient rehabilitation.  I have reviewed the patient's chart and labs.  Questions were answered to the patient's satisfaction.  Eric Scott T 05/30/2012, 9:00 PM

## 2012-05-30 NOTE — Progress Notes (Signed)
Physical Therapy Treatment Patient Details Name: Eric Scott MRN: [id number] DOB: 11/01/1950 Today's Date: 05/30/2012 Time: [id number] PT Time Calculation (min): 26 min  PT Assessment / Plan / Recommendation Comments on Treatment Session  Pt cont's to require +2 (A) for OOB activity at this date.      Follow Up Recommendations  CIR     Does the patient have the potential to tolerate intense rehabilitation     Barriers to Discharge        Equipment Recommendations  Rolling walker with 5" wheels    Recommendations for Other Services    Frequency Min 4X/week   Plan Discharge plan remains appropriate    Precautions / Restrictions Precautions Precautions: Fall Restrictions Weight Bearing Restrictions: No       Mobility  Bed Mobility Bed Mobility: Rolling Left;Left Sidelying to Sit;Sitting - Scoot to [employer] of Bed Rolling Left: 2: Max assist;With rail Left Sidelying to Sit: 1: +1 Total assist;With rails;HOB elevated Sitting - Scoot to Edge of Bed: 2: Max assist Details for Bed Mobility Assistance: Pt rolling to Lt side with max directional cues & (A) to guide RUE across body to rail on Lt, as well as bil LE management.  (A) to lift shoulders/trunk to sitting upright from sidelying.  Use of draw pad to scoot hips closer to EOB.   Transfers Transfers: Sit to Stand;Stand to Sit;Stand Pivot Transfers Sit to Stand: 1: +2 Total assist;With upper extremity assist;From bed;From chair/3-in-1 Sit to Stand: Patient Percentage: 50% Stand to Sit: 1: +2 Total assist;With upper extremity assist;To bed;To chair/3-in-1;With armrests Stand to Sit: Patient Percentage: 60% Stand Pivot Transfers: 1: +2 Total assist Stand Pivot Transfers: Patient Percentage: 60% Details for Transfer Assistance: Cues initiation, sequencing, technique.  (A) to achieve standing, anterior translation of trunk over BOS, facilitation for tall posture, balance, & controlled descent.  Pt performed sit<>stand 5x's.  Leans  heavily to Rt side with flexed posture but able to correct when cued.  Blocking of Rt knee provided to prevent buckling.  Ambulation/Gait Ambulation/Gait Assistance: Not tested (comment) General Gait Details: Performed pregait activities of lateral weight shifting & standing marching; Pt's Rt knee buckling.   Stairs: No     PT Goals Acute Rehab PT Goals Time For Goal Achievement: 06/10/12 Potential to Achieve Goals: Good Pt will go Supine/Side to Sit: with min assist PT Goal: Supine/Side to Sit - Progress: Progressing toward goal Pt will Sit at [facility] of Bed: with min assist;with unilateral upper extremity support;3-5 min PT Goal: Sit at Edge Of Bed - Progress: Progressing toward goal Pt will go Sit to Supine/Side: with min assist;with rail Pt will go Sit to Stand: with min assist;from elevated surface;with upper extremity assist PT Goal: Sit to Stand - Progress: Progressing toward goal Pt will go Stand to Sit: with min assist PT Goal: Stand to Sit - Progress: Progressing toward goal Pt will Stand: with min assist;with bilateral upper extremity support;3 - 5 min PT Goal: Stand - Progress: Progressing toward goal Pt will Ambulate: 51 - 150 feet;with least restrictive assistive device;with mod assist  Visit Information  Last PT Received On: 05/30/12 Assistance Needed: +2    Subjective Data      Cognition  Overall Cognitive Status: Impaired Area of Impairment: Awareness of deficits Arousal/Alertness: Awake/alert Behavior During Session: Flat affect Awareness of Deficits: Pt with decreased Rt side awareness.  Requires cues to correct LOB to Rt side.   Problem Solving: Pt requires max directional cues for sequencing & technique.  Balance  Balance Balance Assessed: Yes Static Sitting Balance Static Sitting - Balance Support: Feet supported;Bilateral upper extremity supported Static Sitting - Level of Assistance: 5: Stand by assistance Static Sitting - Comment/# of Minutes: Pt  cont's to lean to Rt side but able to correct with VC's.    End of Session PT - End of Session Equipment Utilized During Treatment: Gait belt Activity Tolerance: Patient tolerated treatment well;Patient limited by fatigue Patient left: in chair;with call bell/phone within reach;Other (comment) (CIR admission coordinator present) Nurse Communication: Mobility status     Sarajane Marek, [place] [phone] 05/30/2012

## 2012-05-30 NOTE — PMR Pre-admission (Signed)
PMR Admission Coordinator Pre-Admission Assessment  Patient: Eric Scott is an 61 y.o., male MRN: [id number] DOB: 08-Nov-1950 Height:   Weight:               Insurance Information HMO: yes     PRIMARY: Bubba Hales      Policy#: [id number]      Subscriber: self CM Name: Army Melia      Phone#: [phone]      Pre-Cert#: [id number]      Employer: disabled (Received CIR authorization from [place] @ [phone]) Benefits:  Phone #: [phone]     Name: Kathreen Cosier. Date: 06/05/11     Deduct: $0      Out of Pocket Max: $3900      Life Max: none CIR: $220/day 1-7 then 100%      SNF:($50/day 1-20) ( $150/day 21-40) $0/41-100) Outpatient:     Co-Pay: $40 No limits Home Health: 100%      Co-Pay: 0 No limits DME: 80/20%     Co-Pay: 0 Providers: in network  Emergency Contact Information Contact Information    Name Relation Home Work Mobile   Dunaj,Karen Spouse (seperated)   [phone]     Current Medical History  Patient Admitting Diagnosis: Left PLIC infarct  History of Present Illness:61 y.o. right-handed male with history of type 2 diabetes mellitus and tobacco abuse as well as hypertension. Admitted 05/27/2012 with right-sided weakness and altered mental status. 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 MRA of the head with moderate small vessel disease moderate proximal stenosis of the right P1 segment without any other significant proximal stenosis or aneurysm. Echocardiogram with ejection fraction of 60% and normal systolic function. Carotid Dopplers with no ICA stenosis. Maintained on aspirin therapy for stroke prophylaxis as well as subcutaneous Lovenox for DVT prophylaxis. A NicoDerm patch was introduced for history of tobacco abuse. There was report of patient with  history of fecal incontinence x6 months and worsening lower extremity pain over the last 1-1/2 years workup currently ongoing with order for MRI lumbar spine.  Total: 7 = [facility]   Past Medical History  Past Medical History  Diagnosis Date  . Hypertension   . Hypercholesteremia   . Type II diabetes mellitus   . Diabetic peripheral neuropathy     "chronic" (05/27/2012)  . Stroke 05/2012    Subacute, lacunar infarcts within the left basal ganglia and posterior limp of the left internal capsule/thalamus; "RUE; both feet weak" (05/27/2012)  . Anxiety   . Spinal stenosis     mild lumbar (MRI 05/2012)-L2-L3 to L4-L5 , mild lumbar foraminal stenosis   . Chronic pain     legs, back; MRI 05/2012 with mild thoracic degenerative changes no spinal stenosis    Family History  family history is not on file.  Prior Rehab/Hospitalizations: none  Current Medications  Current facility-administered medications:0.9 %  sodium chloride infusion, , Intravenous, Continuous, Cresenciano Genre, MD, Last Rate: 100 mL/hr at 05/27/12 1330;  acetaminophen (TYLENOL) tablet 500 mg, 500 mg, Oral, Q6H PRN, Cresenciano Genre, MD;  aspirin suppository 300 mg, 300 mg, Rectal, Daily, Hadassah Pais, MD;  aspirin tablet 325 mg, 325 mg, Oral, Daily, Hadassah Pais, MD, 325 mg at 05/30/12 1100 enoxaparin (LOVENOX) injection 40 mg, 40 mg, Subcutaneous, Q24H, Hadassah Pais, MD, 40  mg at 05/30/12 1103;  feeding supplement (ENSURE COMPLETE) liquid 237 mL, 237 mL, Oral, BID BM, Rogue Bussing, RD, 237 mL at 05/30/12 1000;  hydrochlorothiazide (MICROZIDE) capsule 12.5 mg, 12.5 mg, Oral, Daily, Cresenciano Genre, MD, 12.5 mg at 05/30/12 1100 HYDROcodone-acetaminophen (NORCO/VICODIN) 5-325 MG per tablet 1-2 tablet, 1-2 tablet, Oral, Q4H PRN, Cresenciano Genre, MD;  metroNIDAZOLE (FLAGYL) tablet 500 mg, 500 mg, Oral, Q8H, Cresenciano Genre, MD, 500 mg at 05/30/12 1103;  nicotine (NICODERM CQ - dosed in mg/24 hours) patch 21 mg, 21 mg, Transdermal, Daily,  Cresenciano Genre, MD, 21 mg at 05/30/12 1100;  ondansetron (ZOFRAN) injection 4 mg, 4 mg, Intravenous, Q6H PRN, Hadassah Pais, MD potassium chloride SA (K-DUR,KLOR-CON) CR tablet 40 mEq, 40 mEq, Oral, BID, Cresenciano Genre, MD;  simvastatin (ZOCOR) tablet 10 mg, 10 mg, Oral, q1800, Cresenciano Genre, MD, 10 mg at 05/29/12 1734  Patients Current Diet: Cardiac  Precautions / Restrictions Precautions Precautions: Fall Precaution Comments: HIGH RISK FOR FALLS Restrictions Weight Bearing Restrictions: No   Prior Activity Level    Home Assistive Devices / [place] Devices/Equipment: Cane (specify quad or straight);Eyeglasses (straight cane) Home Adaptive Equipment: Straight cane  Prior Functional Level Prior Function Level of Independence: Independent with assistive device(s) Able to Take Stairs?: Yes Driving: Yes Vocation: On disability  Current Functional Level Cognition  Arousal/Alertness: Awake/alert Overall Cognitive Status: Impaired Overall Cognitive Status: Impaired Current Attention Level: Sustained Attention - Other Comments: needs multiple cues to correct loss of balance, able to attend to mobility task for approx 3 min with cues for safety Memory:  (unable to recall reason in hospital) Memory Deficits: Pt is unable to remember reason for admission or test results Orientation Level: Oriented X4 Awareness of Deficits: Pt with decreased Rt side awareness.  Requires cues to correct LOB to Rt side.   Attention: Sustained Sustained Attention: Impaired Sustained Attention Impairment: Verbal basic;Functional basic Memory: Impaired Memory Impairment: Storage deficit;Retrieval deficit;Decreased recall of new information;Decreased short term memory;Prospective memory Awareness: Impaired Awareness Impairment: Anticipatory impairment;Intellectual impairment;Emergent impairment Problem Solving: Impaired Problem Solving Impairment: Verbal basic;Functional basic Executive  Function: Self Monitoring;Self Correcting Self Correcting: Impaired Self Correcting Impairment: Verbal basic;Functional basic Safety/Judgment: Impaired    Extremity Assessment (includes Sensation/Coordination)  RUE ROM/Strength/Tone: Deficits RUE ROM/Strength/Tone Deficits: AROM hand delayed Brunstrom V strength 4 out 5, AROM elbow Brunstrom V, AROM at shoulder Brunstrom III pt has decreased control at shoulder. Pt is unable to complete shoulder flexion > than 20 degrees RUE Sensation: History of peripheral neuropathy RUE Coordination: Deficits RUE Coordination Deficits: tremor decreased control  RLE ROM/Strength/Tone: Deficits RLE ROM/Strength/Tone Deficits: strength grossly 2-/5 hip and knee; ankle 3+/5 RLE Sensation: Deficits RLE Sensation Deficits: abnormal sensation with pain both feet    ADLs  Eating/Feeding: Moderate assistance (provided red foam elevated surface and pillow positioning) Where Assessed - Eating/Feeding: Chair Grooming: Moderate assistance;Wash/dry face Where Assessed - Grooming: Supported sitting Upper Body Dressing: Maximal assistance Where Assessed - Upper Body Dressing: Supported sitting Lower Body Dressing: Maximal assistance Where Assessed - Lower Body Dressing: Supported sitting Toilet Transfer: +2 Total assistance Toilet Transfer: Patient Percentage: 60% Toilet Transfer Method: Sit to Loss adjuster, chartered: Raised toilet seat with arms (or 3-in-1 over toilet) Equipment Used: Gait belt (hand held (A)) Transfers/Ambulation Related to ADLs: Pt required v/c and visual cue for upright posture. Pt following single commands. Pt side stepping to left side with increased time. ADL Comments: Pt supine on arrival. Pt oriented to  place, month, DOB however unable to describe situation and reason for admission. Pt sitting Eob with right lean. Pt provided facilitation at trunk for upright posture. Pt is able to perform hip flexion and extension during session  controlling eob sitting with min (A) once. Pt stand pivot to chair for upright sitting self feeding. Pt with decreased motor control of right UE. Pt provided red foam for spoon. P attempting to use Rt UE and needs Lt UE to steady right hand. Pt requires cueing and (A) for all self feeding.    Mobility  Bed Mobility: Rolling Left;Left Sidelying to Sit;Sitting - Scoot to [employer] of Bed Rolling Left: 2: Max assist;With rail Left Sidelying to Sit: 1: +1 Total assist;With rails;HOB elevated Left Sidelying to Sit: Patient Percentage: 40% Supine to Sit: 2: Max assist Sitting - Scoot to [employer] of Bed: 2: Max assist Sit to Supine: 1: +2 Total assist Sit to Supine: Patient Percentage: 30%    Transfers  Transfers: Sit to Stand;Stand to [employer] Transfers Sit to Stand: 1: +2 Total assist;With upper extremity assist;From bed;From chair/3-in-1 Sit to Stand: Patient Percentage: 50% Stand to Sit: 1: +2 Total assist;With upper extremity assist;To bed;To chair/3-in-1;With armrests Stand to Sit: Patient Percentage: 60% Stand Pivot Transfers: 1: +2 Total assist Stand Pivot Transfers: Patient Percentage: 60%    Ambulation / Gait / Stairs / Wheelchair Mobility  Ambulation/Gait Ambulation/Gait Assistance: Not tested (comment) Ambulation Distance (Feet): 2 Feet Assistive device: Rolling walker Ambulation/Gait Assistance Details: ambulated forward about 2 steps, then backwards to bed, secondary to severe right lean on PT when lifting right foot to move for ambulation. Gait Pattern: Lateral trunk lean to right General Gait Details: Performed pregait activities of lateral weight shifting & standing marching; Pt's Rt knee buckling.   Stairs: No    Posture / Balance Static Sitting Balance Static Sitting - Balance Support: Feet supported;Bilateral upper extremity supported Static Sitting - Level of Assistance: 5: Stand by assistance Static Sitting - Comment/# of Minutes: Pt cont's to lean to Rt side but able  to correct with VC's.   Dynamic Sitting Balance Dynamic Sitting - Balance Support: Left upper extremity supported;During functional activity Dynamic Sitting - Level of Assistance: 2: Max assist Dynamic Sitting Balance - Compensations: Patient reaching to pull up sock on right foot and needs max assist to avoid loss of balance right and anterior. Static Standing Balance Static Standing - Balance Support: Bilateral upper extremity supported Static Standing - Level of Assistance: 1: +2 Total assist Static Standing - Comment/# of Minutes: Cues for tall posture.      Special needs/care consideration BiPAP/CPAP - No CPM - No Continuous Drip IV - yes Dialysis - No         Life Vest - No Oxygen - No Special Bed - No Trach Size - No Wound Vac (area) - No      Skin: Laceration of the right 4th toe, skin tear with some sanguinous fluid collected under the skin flap. Measurement:1.0cm x 1.0cmx 0.2cm  Wound bed: pink and moist  Drainage (amount, consistency, odor) serous expressed from under the skin flap  Periwound:intact without problems  Dressing procedure/placement/frequency: would recommend to wrap the toe with Vaseline gauze will serve to insulate and will not adhere to the wound bed, weave gauze between toes to hold in place                             Bowel mgmt:  incontinent Bladder mgmt: incontinent, LBM 12/27 Diabetic mgmt - Yes   Pt is on contact isolation: enteric    Previous Home Environment Living Arrangements: Other relatives;Non-relatives/Friends ("go back and forth") Lives With: Friend(s) Available Help at Discharge: Friend(s) Type of Home: Mobile home Home Layout: One level Home Access: Stairs to enter;Ramped entrance Bathroom Shower/Tub: Tub/shower unit;Curtain Biochemist, clinical: [place]: No  Discharge Living Setting Plans for Discharge Living Setting: Lives with (comment);Mobile Home (Lives with a friend, Andree Coss.) Type of Home at  Discharge: Mobile home Discharge Home Layout: One level Discharge Home Access: Ramped entrance Discharge Bathroom Shower/Tub: Tub/shower unit Discharge Bathroom Toilet: Standard Do you have any problems obtaining your medications?: No  Social/Family/Support Systems Patient Roles: Lives with a friend/roomate. Is seperated from wife. Estranged from children. Contact Information: [phone] (not in service) Anticipated Caregiver: Unsure if pt's friend can provide assist. He is on disability and does not drive. Anticipated Caregiver's Contact Information: unable to contact. Ability/Limitations of Caregiver: Unsure at this time. Caregiver Availability: 24/7 (Pt's friend/roomate is at home 24/7.) Discharge Plan Discussed with Primary Caregiver: No Does Caregiver/Family have Issues with Lodging/Transportation while Pt is in Rehab?: Yes (Pt's ex-wife and friend do not drive.)  Goals/Additional Needs Patient/Family Goal for Rehab: PT&OT: min A, ST: S Expected length of stay: 2-3 weeks Cultural Considerations: none Dietary Needs: regular Equipment Needs: TBD Additional Information: Attempting to reach pt's ex-wife to obtain more information on home situation and to request clothes to be brought. Pt/Family Agrees to Admission and willing to participate: Yes (Pt agrees to CIR) Program Orientation Provided & Reviewed with Pt/Caregiver Including Roles  & Responsibilities: Yes (With patient.)  Decrease burden of Care through IP rehab admission: Decrease number of caregivers, Bowel and bladder program and Patient/family education  Possible need for SNF placement upon discharge: Yes, due to discharge plan is unknown at this time.  Patient Condition: This patient's condition remains as documented in the Consult dated 05/29/12, in which the Rehabilitation Physician determined and documented that the patient's condition is appropriate for intensive rehabilitative care in an inpatient rehabilitation  facility.  Preadmission Screen Completed By:  Flo Shanks, 05/30/2012 12:19 PM ______________________________________________________________________   Discussed status with Dr. Naaman Plummer on 05/30/12 at 10:00 AM and received approval for admission today.  Admission Coordinator:  Flo Shanks, time12:50 PM/Date12/27/13

## 2012-05-30 NOTE — Discharge Summary (Signed)
[facility] Discharge Note  Name: Eric Scott MRN: [id number] DOB: 16-Apr-1951 61 y.o.  Date of Admission: 05/27/2012  6:40 AM Date of Discharge: 05/30/2012 Attending Physician: Bartholomew Crews, MD PCP: Dr. August Albino Addis  Discharge Diagnosis: 1. 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 HTN  3. Right 4th toe laceration  4. C.difficile positive with diarrhea  6. History of DM 2 7. Tobacco abuse 8. Hypokalemia  Discharge Medications:   Medication List     As of 05/30/2012  2:19 PM    TAKE these medications         aspirin 325 MG tablet   Take 1 tablet (325 mg total) by mouth daily.      hydrochlorothiazide 12.5 MG capsule   Commonly known as: MICROZIDE   Take 1 capsule (12.5 mg total) by mouth daily.      metroNIDAZOLE 500 MG tablet   Commonly known as: FLAGYL   Take 1 tablet (500 mg total) by mouth every 8 (eight) hours.      nicotine 21 mg/24hr patch   Commonly known as: NICODERM CQ - dosed in mg/24 hours   Place 1 patch onto the skin daily.      potassium chloride SA 20 MEQ tablet   Commonly known as: K-DUR,KLOR-CON   Take 2 tablets (40 mEq total) by mouth every 4 (four) hours.      potassium chloride SA 20 MEQ tablet   Commonly known as: K-DUR,KLOR-CON   Take 2 tablets (40 mEq total) by mouth 2 (two) times daily.      pravastatin 10 MG tablet   Commonly known as: PRAVACHOL   Take 1 tablet (10 mg total) by mouth daily.        Of note only should be getting Simvastatin while in the hospital (no Pravastatin in the hospital).  At discharge to start Pravastatin.    Disposition and follow-up:   Mr.Eric Scott was discharged from [facility] in stable condition to [facility] rehab.  At the hospital follow up visit please address:   1)  BMET 2) Chronic pain-In rehab consider starting medication for pain he was getting Norco/Vicodin with Internal Medicine Service and outpatient was given narcotics by his PCP for chronic pain (lower extremities and back) 3) Repeat C. Difficile studies outpatient-Continue Flagyl for 10 days in rehab 05/30/12-06/08/12 4) Continue Kdur 12/27 and 12/28 and trend BMET to follow potassium in rehab  5) Continue Zocor inpatient and transition to Pravastatin which is more affordable outpatient-I already faxed this to patient's pharmacy 6) Refer to [facility] for medications which may be cheaper.  7) Medication compliance  Follow-up Appointments:  Discharge Orders    Future Appointments: Provider: Department: Dept Phone: Center:   05/31/2012 8:00 AM Mariah Milling, [place]  [address] INPAT[address]  REHAB [phone] None   05/31/2012 9:30 AM Orbie Pyo Gamaliel, [place]  [address] INPATIENT  [place] [phone] None   05/31/2012 11:00 AM [place]  [place] [phone] None   06/01/2012 8:45 AM Robyn Kelby Fam, [place]  [place] [phone] None   06/01/2012 10:30 AM [place]  [address] INPATIENT  REHAB [phone] None   06/01/2012 1:45 PM  Robyn Kelby Fam, [place]  [address] INPATIENT  [place] [phone] None   06/01/2012 3:00 PM Jeanene Erb, PTA [place]  [place] [phone] None     Future Orders Please Complete By Expires   DME Bedside commode      Comments:   3:1 bedside commode   For home use only DME wheelchair cushion (seat and back)      Comments:   Measurements OT   Wheelchair      Comments:   Measurements OT   For home use only DME Walker rolling      Diet - low sodium heart healthy      Increase activity slowly      Discharge instructions       Comments:   Please follow up with your regular doctor after discharge from rehab Please pick up your medications from the pharmacy Please stop smoking   Driving Restrictions      Comments:   No driving until further instructed      Consultations:  1) Rehab (CIR) 2) PT/OT  Procedures Performed:  Dg Chest 2 View  05/27/2012  *RADIOLOGY REPORT*  Clinical Data: Body weakness  CHEST - 2 VIEW  Comparison: None.  Findings: Examination is degraded secondary to patient body habitus.  Enlarged cardiac silhouette and mediastinal contours. There is mild diffuse thickening of the pulmonary interstitium. Mild symmetric bibasilar heterogeneous opacities.  Asymmetric heterogeneous opacities are seen within the right mid lung.  No pleural effusion or pneumothorax.  No acute osseous abnormalities.  IMPRESSION: 1.  Enlarged cardiac silhouette.  Further evaluation of cardiac echo may performed as clinically indicated. 2.  Possible airspace opacity within the right mid lung may represent an area of developing infection.  Continued attention on follow-up is recommended.  3.  Symmetric bibasilar opacities are favored to represent atelectasis.   Original Report Authenticated By: Jake Seats, MD    Ct Head Wo Contrast  05/27/2012  *RADIOLOGY REPORT*  Clinical Data: Right-sided weakness, unsteady gait, right-sided facial droop, last seen normal 3 days ago.  CT HEAD WITHOUT CONTRAST  Technique:  Contiguous axial images were obtained from the base of the skull through the vertex without contrast.  Comparison: None.  Findings:  There is a geographic slightly ill-defined area of decreased attenuation within the posterior limb of the left internal capsule extending to involve the left thalamus (images 13 and 14).  There is an additional geographic linear area of decreased attenuation within the more anterior aspect of the left basal ganglia (images 11-13).  Grossly symmetric periventricular hypodensities are seen about the  occipital horn of the bilateral lateral ventricles compatible with microvascular ischemic disease.  No intraparenchymal or extra-axial mass or hemorrhage.  Normal size and configuration of the ventricles and basilar cisterns.  No midline shift.  There is minimal opacification within the left anterior ethmoidal air cells.  Remaining paranasal sinuses and mastoid air cells are normally aerated.  Regional soft tissues are normal.  No displaced calvarial fracture.  IMPRESSION: 1.  Age indeterminate, likely at least subacute, lacunar infarcts within the left basal ganglia and posterior limb of the left internal capsule/thalamus.  No intraparenchymal or extra-axial hemorrhage.  Further evaluation with MRI may be performed as clinically indicated.  2.  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   Original Report Authenticated By: Jake Seats, MD    Mr Brain Wo Contrast  05/28/2012  *RADIOLOGY REPORT*  Clinical Data:  Stroke.  Several day history of right-sided weakness.  Abnormal CT scan of the head.  MRI HEAD WITHOUT CONTRAST MRA HEAD WITHOUT CONTRAST  Technique:  Multiplanar, multiecho pulse sequences of the brain and surrounding structures were obtained without intravenous contrast. Angiographic images of the head were obtained using MRA technique without contrast.  Comparison:  CT head without contrast 05/27/2012.  MRI HEAD  Findings:  The diffusion weighted images confirm an acute non hemorrhagic infarct along the posterior limb of the left internal capsule.  There may be some involvement of the left lateral thalamus. A slightly older subacute non hemorrhagic infarct is evident within the anterior left external capsule with some involvement of the caudate head and lentiform nucleus.  Mild generalized atrophy is present.  There is extensive periventricular subcortical white matter change bilaterally.  The perivascular spaces are dilated. A more remote lacunar infarct is present within the left thalamus.  Flow is  present in the major intracranial arteries.  The globes and orbits are intact.  Mild mucosal thickening is present in the anterior ethmoid air cells bilaterally.  The paranasal sinuses and mastoid air cells are otherwise clear.  IMPRESSION:  1.  Acute non hemorrhagic infarct involving the posterior limb of the left internal capsule and portions of the lateral thalamus. 2.  Slightly older subacute non hemorrhagic infarct involving the anterior left external capsule with slight involvement of the left caudate head and anterior lentiform nucleus. 3.  Remote lacunar infarct of the left thalamus. 4.  Atrophy and extensive white matter disease.  This likely reflects the sequelae of chronic microvascular ischemia.  MRA HEAD  Findings: The study is mildly degraded by patient motion.  The internal carotid arteries are within normal limits from high cervical segments through the ICA termini bilaterally.  Accounting for motion, the A1 and M1 segments are normal.  There is some attenuation of distal MCA branch vessels bilaterally without a significant proximal stenosis or occlusion.  The vertebral arteries are codominant.  The PICA origins are visualized and within normal limits bilaterally.  The basilar artery is within normal limits.  There is a moderate proximal stenosis of the right P1 segment.  Distal small vessel attenuation is present bilaterally.  IMPRESSION:  1.  Moderate small vessel disease. The acute or subacute infarcts are both within the territory of perforators, compatible with small vessel disease. 2.  Moderate proximal stenosis of the right P1 segment. 3.  No other significant proximal stenosis, aneurysm, or branch vessel occlusion.   Original Report Authenticated By: San Morelle, M.D.    Mr Thoracic Spine W Wo Contrast  05/29/2012  *RADIOLOGY REPORT*  Clinical Data:  61 year old male with back pain, incontinence. Diabetes.  Lower extremity pain.  Contrast: 20 ml MultiHance.  MRI THORACIC SPINE  WITHOUT AND WITH CONTRAST  Technique: Multiplanar and multiecho pulse sequences of the thoracic spine were obtained without and  with intravenous contrast.  Comparison: Brain MRI 05/27/2012.  CT abdomen and pelvis 07/20/2010 and earlier.  Findings:  Limited sagittal imaging of most of the cervical spine is within normal limits.  Thoracic vertebral height and alignment is within normal limits. No marrow edema or evidence of acute osseous abnormality.  No abnormal enhancement identified.  Spinal cord signal is within normal limits at all visualized levels.  Conus medullaris better depicted on lumbar images below.  Mild for age thoracic spine degeneration.  The only focal disc herniation identified is a small left paracentral disc protrusion at T10-T11.  No significant spinal stenosis results.  No foraminal stenosis identified.  Dependent atelectasis.  Chronic right adrenal adenoma partially visible.  IMPRESSION: 1.  Mild for age thoracic spine degenerative changes with no spinal stenosis.  No acute findings identified in the thoracic spine. 2.  See lumbar findings below.  MRI LUMBAR SPINE WITHOUT AND WITH CONTRAST  Technique: Multiplanar and multiecho pulse sequences of the lumbar spine were obtained without and with intravenous contrast.  Findings:  Normal lumbar segmentation.  Trace retrolisthesis of L5 on S1, otherwise normal vertebral height and alignment.  Marrow signal within normal limits.  There is thin linear enhancement of the right S2 nerve root throughout the cauda equina (series 20 image 7, series 21 image 32).  No associated focal impingement of the nerve root is identified, but this might be related to more generalized spinal stenosis as described below.  No abnormal enhancement identified.   Visualized lower thoracic spinal cord is normal with conus medularis at L1-L2.  Negative visualized abdominal viscera. Visualized paraspinal soft tissues are within normal limits.  T12-L1:  Negative.  L1-L2:   Negative.  L2-L3:  Mild disc desiccation and disc bulge.  Mild facet and ligament flavum hypertrophy.  Borderline to mild spinal stenosis.  L3-L4:  Mostly far lateral bilateral disc bulges and endplate spurring.  Mild to moderate facet and ligament flavum hypertrophy. Borderline to mild spinal stenosis.  Mild right greater than left L3 foraminal stenosis.  L4-L5:  Right eccentric circumferential disc osteophyte complex.  A small right foraminal annular tear.  Moderate facet and ligament flavum hypertrophy also greater on the right.  Borderline to mild spinal stenosis.  Mild right lateral recess stenosis.  Moderate right L4 foraminal stenosis.  L5-S1:  Disc space loss.  Suspect vacuum disc phenomena.  Left eccentric circumferential disc osteophyte complex.  Incidental left L5 inferior endplate benign vertebral hemangioma.  Mild facet and ligament flavum hypertrophy.  Mild left greater than right lateral recess stenosis.  No significant spinal stenosis.  Moderate left L5 foraminal stenosis.  IMPRESSION: 1. 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  Borderline to mild multifactorial spinal stenosis from L2-L3 to L4-L5. 2.  Multifactorial moderate right L4 and left L5 neural foraminal stenosis.  Mild lumbar foraminal stenosis elsewhere. 3.  Thin linear enhancement of the right S2 nerve root throughout the cauda equina, favor related to the above degenerative changes. No other abnormal enhancement.   Original Report Authenticated By: Roselyn Reef, M.D.    Mr Lumbar Spine W Wo Contrast  05/29/2012  *RADIOLOGY REPORT*  Clinical Data:  61 year old male with back pain, incontinence. Diabetes.  Lower extremity pain.  Contrast: 20 ml MultiHance.  MRI THORACIC SPINE WITHOUT AND WITH CONTRAST  Technique: Multiplanar and multiecho pulse sequences of the thoracic spine were obtained without and  with intravenous contrast.  Comparison: Brain MRI 05/27/2012.  CT abdomen and pelvis 07/20/2010 and earlier.  Findings:   Limited sagittal imaging of most of the cervical spine is within normal limits.  Thoracic vertebral height and alignment is within normal limits. No marrow edema or evidence of acute osseous abnormality.  No abnormal enhancement identified.  Spinal cord signal is within normal limits at all visualized levels.  Conus medullaris better depicted on lumbar images below.  Mild for age thoracic spine degeneration.  The only focal disc herniation identified is a small left paracentral disc protrusion at T10-T11.  No significant spinal stenosis results.  No foraminal stenosis identified.  Dependent atelectasis.  Chronic right adrenal adenoma partially visible.  IMPRESSION: 1.  Mild for age thoracic spine degenerative changes with no spinal stenosis.  No acute findings identified in the thoracic spine. 2.  See lumbar findings below.  MRI LUMBAR SPINE WITHOUT AND WITH CONTRAST  Technique: Multiplanar and multiecho pulse sequences of the lumbar spine were obtained without and with intravenous contrast.  Findings:  Normal lumbar segmentation.  Trace retrolisthesis of L5 on S1, otherwise normal vertebral height and alignment.  Marrow signal within normal limits.  There is thin linear enhancement of the right S2 nerve root throughout the cauda equina (series 20 image 7, series 21 image 32).  No associated focal impingement of the nerve root is identified, but this might be related to more generalized spinal stenosis as described below.  No abnormal enhancement identified.   Visualized lower thoracic spinal cord is normal with conus medularis at L1-L2.  Negative visualized abdominal viscera. Visualized paraspinal soft tissues are within normal limits.  T12-L1:  Negative.  L1-L2:  Negative.  L2-L3:  Mild disc desiccation and disc bulge.  Mild facet and ligament flavum hypertrophy.  Borderline to mild spinal stenosis.  L3-L4:  Mostly far lateral bilateral disc bulges and endplate spurring.  Mild to moderate facet and ligament flavum  hypertrophy. Borderline to mild spinal stenosis.  Mild right greater than left L3 foraminal stenosis.  L4-L5:  Right eccentric circumferential disc osteophyte complex.  A small right foraminal annular tear.  Moderate facet and ligament flavum hypertrophy also greater on the right.  Borderline to mild spinal stenosis.  Mild right lateral recess stenosis.  Moderate right L4 foraminal stenosis.  L5-S1:  Disc space loss.  Suspect vacuum disc phenomena.  Left eccentric circumferential disc osteophyte complex.  Incidental left L5 inferior endplate benign vertebral hemangioma.  Mild facet and ligament flavum hypertrophy.  Mild left greater than right lateral recess stenosis.  No significant spinal stenosis.  Moderate left L5 foraminal stenosis.  IMPRESSION: 1. 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  Borderline to mild multifactorial spinal stenosis from L2-L3 to L4-L5. 2.  Multifactorial moderate right L4 and left L5 neural foraminal stenosis.  Mild lumbar foraminal stenosis elsewhere. 3.  Thin linear enhancement of the right S2 nerve root throughout the cauda equina, favor related to the above degenerative changes. No other abnormal enhancement.   Original Report Authenticated By: Roselyn Reef, M.D.    Mr Mra Head/brain Wo Cm  05/28/2012  *RADIOLOGY REPORT*  Clinical Data:  Stroke.  Several day history of right-sided weakness.  Abnormal CT scan of the head.  MRI HEAD WITHOUT CONTRAST MRA HEAD WITHOUT CONTRAST  Technique:  Multiplanar, multiecho pulse sequences of the brain and surrounding structures were obtained without intravenous contrast. Angiographic images of the head were obtained using MRA technique without contrast.  Comparison:  CT head without contrast 05/27/2012.  MRI HEAD  Findings:  The diffusion weighted images confirm an acute non hemorrhagic infarct along the posterior limb of the left internal capsule.  There may be some involvement of the left lateral thalamus. A slightly older subacute  non hemorrhagic infarct is evident within the anterior left external capsule with some involvement of the caudate head and lentiform nucleus.  Mild generalized atrophy is present.  There is extensive periventricular subcortical white matter change bilaterally.  The perivascular spaces are dilated. A more remote lacunar infarct is present within the left thalamus.  Flow is present in the major intracranial arteries.  The globes and orbits are intact.  Mild mucosal thickening is present in the anterior ethmoid air cells  bilaterally.  The paranasal sinuses and mastoid air cells are otherwise clear.  IMPRESSION:  1.  Acute non hemorrhagic infarct involving the posterior limb of the left internal capsule and portions of the lateral thalamus. 2.  Slightly older subacute non hemorrhagic infarct involving the anterior left external capsule with slight involvement of the left caudate head and anterior lentiform nucleus. 3.  Remote lacunar infarct of the left thalamus. 4.  Atrophy and extensive white matter disease.  This likely reflects the sequelae of chronic microvascular ischemia.  MRA HEAD  Findings: The study is mildly degraded by patient motion.  The internal carotid arteries are within normal limits from high cervical segments through the ICA termini bilaterally.  Accounting for motion, the A1 and M1 segments are normal.  There is some attenuation of distal MCA branch vessels bilaterally without a significant proximal stenosis or occlusion.  The vertebral arteries are codominant.  The PICA origins are visualized and within normal limits bilaterally.  The basilar artery is within normal limits.  There is a moderate proximal stenosis of the right P1 segment.  Distal small vessel attenuation is present bilaterally.  IMPRESSION:  1.  Moderate small vessel disease. The acute or subacute infarcts are both within the territory of perforators, compatible with small vessel disease. 2.  Moderate proximal stenosis of the right  P1 segment. 3.  No other significant proximal stenosis, aneurysm, or branch vessel occlusion.   Original Report Authenticated By: San Morelle, M.D.     2D Echo:  05/27/12  Study Conclusions  - Adverse outcomes: Technically difficult study with reduced echocardiographic windows. - Left ventricle: The cavity size was normal. There was severe concentric hypertrophy. Systolic function was normal. The estimated ejection fraction was in the range of 55% to 60%. No gross wall motion abnormalities. The study is not technically sufficient to allow evaluation of LV diastolic function. - Aortic valve: Sclerosis without stenosis. Mild central regurgitation. - Mitral valve: Mildly thickened leaflets . Trivial regurgitation. - Inferior vena cava: The vessel was dilated; the respirophasic diameter changes were blunted (< 50%); findings are consistent with elevated central venous pressure. Transthoracic echocardiography. M-mode, complete 2D, spectral Doppler, and color Doppler. Blood pressure: 168/80. Patient status: Inpatient. Location: Bedside.     Admission HPI:  Chief Complaint: Leg pain and right sided weakness.  History of Present Illness: Patient is a 61 year old man with past history of DM 2 and smoking who comes to the ED with four-day history of worsening leg pain and right-sided weakness.  Patient took a shower on Saturday and was okay until he came out and walked to the kitchen. He starts having worsening bilateral leg pain and then felt weak on right side- including right upper and lower extremities. He had trouble walking. He also had tingling and numbness in bilateral hand. He also was incontinent of his bowel and bladder after that. denies any trouble swallowing. Last meal was yesterday. No fever, chills, nausea, vomiting.  His friend asked him multiple times to come to the hospital but he waited until today.  He denies any associated headache, change in vision,  palpitation, chest pain, shortness of breath, neck pain, abdominal pain, diarrhea.  He has no similar history before.  He is supposed to take baby aspirin daily but the last dose he took was 2 weeks before  Second interview (HPI): Patient states he feels awful. His weakness right sided started Saturday evening prior to admission. He thought it was because his feet were hurting > legs. Then  he noticed he could not stand. He decided to come to the hospital because he was not getting better. He denies dysphagia or slurred speech, chest pain, abdominal pain. He admits to urinary to bowel incontinence. He states he has had diarrhea chronically and last had 10:30 this morning. He reports mild headache. He smokes 2 ppd cigarettes and has been smoking since age 65. He has 1 son living, another son died. He is separated from his wife. Denies EtOH, drugs. He lives with a friend in [place] but is from [place]. He has a 9th grade education. 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 FH: denies. PsuH: right foot surgery. His PCP is in [place] and he does not have transportation problems.   Called pharmacy Hydrocodone 10-650 mg Take 1 tablet tid-Dr. Sherrie Sport, Diazepam 10 mg tab tid (both meds filled 05/06/2012); 02/2012 picked up Morphine sulfate 30 mg tab ER bid, Metformin 500 ER qd (07/2011). Never filled Glimperide 2 mg bid. No blood pressure meds filled in 1 year ago11/2012 Lisinopril 20 mg qd.   Admission Physical Exam:  Blood pressure 154/87, pulse 76, temperature 98.3 F (36.8 C), temperature source Oral, resp. rate 18, SpO2 95.00%.  Constitutional: Vital signs reviewed. Patient is a well-developed and well-nourished in no acute distress and cooperative with exam. Alert and oriented x3. Mild dysarthria.  Head: Normocephalic and atraumatic  Mouth: no erythema or exudates, MMM  Eyes: PERRL, EOMI, conjunctivae normal, No scleral icterus. no gross visual field deficit.  Neck: Supple,  Trachea midline normal ROM, No JVD or carotid bruit present.  Cardiovascular: RRR, S1 normal, S2 normal, no MRG  Pulmonary/Chest: CTAB, no wheezes, rales, or rhonchi  Abdominal: Soft. Non-tender, non-distended, bowel sounds are normal. Bowel and bladder incontinence  Musculoskeletal: No joint deformities, erythema, or stiffness, ROM full and no nontender Neurological: A&O x3, decreased motor strength on right upper and lower extremities. Right facial droop and right tongue deviation. Decreased sensation on right side. Pronator drift present on right arm. Decreased reflexes on right side.  Skin: Warm, dry and intact. No rash, cyanosis, or clubbing.  Psychiatric: Normal mood and affect. speech and behavior is normal. Judgment and thought content normal. Cognition and memory are normal.   Hospital Course by problem list: 1. 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 HTN  3. Right 4th toe laceration  4. C.difficile positive with diarrhea  6. History of DM 2 7. Tobacco abuse 8. Hypokalemia   1. 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   His risk factors include diabetes, noncompliance with hypertension medication, smoking.  He was not on aspirin for at least 2 weeks prior to admission.  We started him on Aspirin 325 mg daily. US carotid with mild plaque disease in the bifurcations bilaterally. No significant ICA stenosis. Vertebral artery flow is antegrade, echo with EF 55-60%, severe  concentric hypertrophy. MRI also with atrophy, small vessel disease. Moderate proximal stenosis of right PI segment.  We consulted PT/OT/inpatient rehab.  He was transferred to inpatient rehab for therapies on 05/30/12.  His LDL was 84 and he was given Zocor 10 qhs this admission.  Though it is uncertain of benefit of normal LDL in stroke treatment with a statin.  This was explained to the patient and he agreed to take a statin. At discharge we will transition to Pravastatin 10 mg qhs due to affordability   2. HTN  He has a history of hypertension and was not taking his blood pressure medication as an outpatient.  72 hours after his acute even we started HCTZ 12.5 mg qd.  His blood pressure will need to be monitored and he may need better control in the future.  He was previously on Lisinopril 20 mg daily but had not picked up his blood pressure medication since 04/2011.    3. Right 4th toe laceration  The wound care nurse consulted recommend to wrap the toe with Vaseline gauze to insulate and will not adhere to the wound bed, weave gauze between toes to hold in place.   4. C.difficile positive with diarrhea  His fecal incontinence may have been due to #4.  He was positive this admission and started on Flagyl 500 mg tid (12/26-Day 2) x 10-14 days total.  We recommend continuing until a least 06/08/12.    5. Chronic pain (back, legs)  With a history of fecal/urinary incontinence for 6 months, lower extremity pain and weakness we were initially concerned for spinal cord compression or cauda equina syndrome.  MRI with mild thoracic spine degenerative changes with no spinal stenosis. No acute findings. Lumbar with chronic lower lumbar disc facet degeneration. Borderline to mild multifactorial spinal stenosis L2-L3 to L4-L5. Mild lumbar foraminal stenosis elsewhere.  He was given Hydrocodone 10-650 mg Take 1 tablet tid by Dr. Sherrie Sport,  Diazepam 10 mg tab tid (both medications filled 05/06/2012).  He also picked  up in  02/2012 Morphine sulfate 30 mg tab ER bid.  We gave him Norvo-Vicodin 5-325 mg 1 tablet q4 hours this admission for pain.     6. History of DM 2  Hemoglobin A1C was 5.3%. We had him on supplemental insulin this admission.  He was on two oral agents outpatient Metformin 500 ER daily and prescribed Glimperide 2 mg bid (though he never picked up this prescription).    7. Tobacco abuse  Smoking 2 ppd prior to admission for several years (age 69).  We gave him a Nicotine patch 21 mcg. We recommend cessation and ordered instructions to provide smoking cessation.   8. Hypokalemia He was hypokalemia today 2.9 on his day of discharge to rehab suspect from GI losses from diarrhea, trend BMET, given Kdur 40 meQ x 3 doses Today (spaced out by 4 hours), then 40 meQ bid x 1 days then stop. Rehab to trend BMET.   He was given Lovenox.    Discharge Vitals:  BP 129/68  Pulse 84  Temp 97.9 F (36.6 C) (Oral)  Resp 16  SpO2 98%  Discharge physical exam:  General: sitting in recliner, nad, alert and oriented to place, person  HEENT: [place]/at  CV: RRR no rubs, murmurs, or gallops  Lungs: ctab  Abdomen: soft, ntnd, +normal bs  Extremities: warm, no cyanosis or edema  Neuro: CN 2-12 grossly intact, slight right facial droop, right lower extremity with 2/5 strength, decreased sensation RLE, right upper extremity 3+/5 strength  Discharge Labs:  Results for AKIF, WOJNO (MRN [id number]) as of 05/30/2012 12:16  Ref. Range 05/27/2012 06:48 05/27/2012 13:28 05/28/2012 06:05 05/28/2012 08:35 05/30/2012 07:05  Sodium Latest Range: 135-145 mEq/L 141   143 141  Potassium Latest Range: 3.5-5.1 mEq/L 3.3 (L)   3.6 2.9 (L)  Chloride Latest Range: 96-112 mEq/L 103   106 100  CO2 Latest Range: 19-32 mEq/L 26   26 25   Mean Plasma Glucose Latest Range: <117 mg/dL  103 105    BUN Latest Range: 6-23 mg/dL 9   9 13   Creatinine Latest Range: 0.50-1.35 mg/dL 0.85   0.83 1.03  Calcium Latest Range: 8.4-10.5 mg/dL  9.1   8.6 9.1  GFR calc non Af Amer Latest Range: >90 mL/min >90   >90 76 (L)  GFR calc Af Amer Latest Range: >90 mL/min >90   >90 89 (L)  Glucose Latest Range: 70-99 mg/dL 140 (H)   148 (H) 197 (H)  Alkaline Phosphatase Latest Range: 39-117 U/L 111      Albumin Latest Range: 3.5-5.2 g/dL 3.4 (L)      AST Latest Range: 0-37 U/L 16      ALT Latest Range: 0-53 U/L 14      Total Protein Latest Range: 6.0-8.3 g/dL 7.0      Total Bilirubin Latest Range: 0.3-1.2 mg/dL 0.6       Results for SAVAUGHN, ESSLER (MRN [id number]) as of 05/30/2012 12:16  Ref. Range 05/27/2012 07:12  Troponin i, poc Latest Range: 0.00-0.08 ng/mL 0.00  Results for JAVONNI, FRISBIE (MRN [id number]) as of 05/30/2012 12:16  Ref. Range 05/28/2012 05:00  Cholesterol Latest Range: 0-200 mg/dL 145  Triglycerides Latest Range: <150 mg/dL 119  HDL Latest Range: >39 mg/dL 37 (L)  LDL (calc) Latest Range: 0-99 mg/dL 84  VLDL Latest Range: 0-40 mg/dL 24  Total CHOL/HDL Ratio No range found 3.9   Results for HELGE, DELZELL (MRN [id number]) as of 05/30/2012 12:16  Ref. Range 05/28/2012 08:35  Folate No range found 10.8   Results for LYNWOOD, SNOUFFER (MRN [id number]) as of 05/30/2012 12:16  Ref. Range 05/28/2012 08:35  Vitamin B-12 Latest Range: 211-911 pg/mL 573   Results for YEE, COLLINGSWORTH (MRN [id number]) as of 05/30/2012 12:16  Ref. Range 05/27/2012 07:05 05/29/2012 13:27  WBC Latest Range: 4.0-10.5 K/uL 10.5 10.6 (H)  RBC Latest Range: 4.22-5.81 MIL/uL 4.85 4.84  Hemoglobin Latest Range: 13.0-17.0 g/dL 17.0 16.7  HCT Latest Range: 39.0-52.0 % 47.5 47.0  MCV Latest Range: 78.0-100.0 fL 97.9 97.1  MCH Latest Range: 26.0-34.0 pg 35.1 (H) 34.5 (H)  MCHC Latest Range: 30.0-36.0 g/dL 35.8 35.5  RDW Latest Range: 11.5-15.5 % 14.2 13.9  Platelets Latest Range: 150-400 K/uL 200 199  Neutrophils Relative Latest Range: 43-77 % 77 77  Lymphocytes Relative Latest Range: 12-46 % 17 15  Monocytes Relative Latest Range: 3-12 % 5 7    Eosinophils Relative Latest Range: 0-5 % 1 1  Basophils Relative Latest Range: 0-1 % 0 0  NEUT# Latest Range: 1.7-7.7 K/uL 8.0 (H) 8.1 (H)  Lymphocytes Absolute Latest Range: 0.7-4.0 K/uL 1.7 1.6  Monocytes Absolute Latest Range: 0.1-1.0 K/uL 0.5 0.8  Eosinophils Absolute Latest Range: 0.0-0.7 K/uL 0.2 0.1  Basophils Absolute Latest Range: 0.0-0.1 K/uL 0.0 0.0   Results for SHANON, BONOMI (MRN [id number]) as of 05/30/2012 12:16  Ref. Range 05/27/2012 07:05  Prothrombin Time Latest Range: 11.6-15.2 seconds 12.5  INR Latest Range: 0.00-1.49  0.94  aPTT Latest Range: 24-37 seconds 33   Results for QUINTUS, GREEAR (MRN [id number]) as of 05/30/2012 12:16  Ref. Range 05/27/2012 13:28 05/28/2012 06:05 05/28/2012 08:35 05/30/2012 07:05  Hemoglobin  A1C Latest Range: <5.7 % 5.2 5.3    Glucose Latest Range: 70-99 mg/dL   148 (H) 197 (H)  TSH Latest Range: 0.350-4.500 uIU/mL   1.157    Results for TIMOFEY, ESSARY (MRN [id number]) as of 05/30/2012 12:16  Ref. Range 05/28/2012 08:35  RPR Latest Range: NON REACTIVE  NON REACTIVE  HIV Latest Range: NON REACTIVE  NON REACTIVE  Results for BAZE, BEEVERS (MRN [id number]) as of 05/30/2012 12:16  Ref. Range 05/29/2012 10:43  C difficile by pcr Latest Range: NEGATIVE  POSITIVE (A)    Results for ROMA, KHOSLA (MRN [id number]) as of 05/30/2012 12:16  Ref. Range 05/28/2012 04:42  AMPHETAMINES Latest Range: NONE DETECTED  NONE DETECTED  Barbiturates Latest Range: NONE DETECTED  NONE DETECTED  Benzodiazepines Latest Range: NONE DETECTED  POSITIVE (A)  Opiates Latest Range: NONE DETECTED  POSITIVE (A)  COCAINE Latest Range: NONE DETECTED  NONE DETECTED  Tetrahydrocannabinol Latest Range: NONE DETECTED  NONE DETECTED   Results for orders placed during the hospital encounter of 05/27/12 (from the past 24 hour(s))  BASIC METABOLIC PANEL     Status: Abnormal   Collection Time   05/30/12  7:05 AM      Component Value Range   Sodium 141  135 - 145 mEq/L    Potassium 2.9 (*) 3.5 - 5.1 mEq/L   Chloride 100  96 - 112 mEq/L   CO2 25  19 - 32 mEq/L   Glucose, Bld 197 (*) 70 - 99 mg/dL   BUN 13  6 - 23 mg/dL   Creatinine, Ser 1.03  0.50 - 1.35 mg/dL   Calcium 9.1  8.4 - 10.5 mg/dL   GFR calc non Af Amer 76 (*) >90 mL/min   GFR calc Af Amer 89 (*) >90 mL/min    Signed: Cresenciano Genre 05/30/2012, 2:19 PM   Time Spent on Discharge: 45 minutes  Services Ordered on Discharge: CIR with PT/OT Equipment Ordered on Discharge: 5"rolling walker, wheelchair and cushion, bedside commode

## 2012-05-30 NOTE — Progress Notes (Signed)
Admitting patient to CIR today. Met with patient at bedside to discuss CIR. Pt would benefit from inpatient rehab and he is in agreement with plan. Have obtained insurance authorization for CIR. Discharge plan is not clear at this time. Have attempted to call pt's ex-wife (seperated) but unable to reach her or leave message. Pt lives with a friend, but no phone # available to contact him. Pt's RN and CSW are aware of plan. For questions, call [phone]

## 2012-05-30 NOTE — H&P (Signed)
Physical Medicine and Rehabilitation Admission Eric&P    No chief complaint on file. : [id number] Eric Scott is a 61 y.o. right-handed male with history of borderline diabetes mellitus and tobacco abuse as well as hypertension. Patient on no scheduled medications prior to admission. Admitted 05/27/2012 with right-sided weakness and altered mental status. 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 MRA of the head with moderate small vessel disease moderate proximal stenosis of the right P1 segment without any other significant proximal stenosis or aneurysm. Echocardiogram with ejection fraction of 60% and normal systolic function. Carotid Dopplers with no ICA stenosis. Maintained on aspirin therapy for stroke prophylaxis as well as subcutaneous Lovenox for DVT prophylaxis. A NicoDerm patch was introduced for history of tobacco abuse. 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 There was no obvious cord compression.. Clostridium difficile specimen obtained 05/29/2012 that was positive and patient initiated on Flagyl. Physical therapy evaluation completed an ongoing with recommendations of physical medicine rehabilitation consult to consider inpatient rehabilitation services. Patient was felt to be a good candidate for inpatient rehabilitation services was admitted for comprehensive rehabilitation  program   Review of Systems  Gastrointestinal:  Bowel incontinence  Musculoskeletal: Positive for myalgias   Past Medical History  Diagnosis Date  . Hypertension   . Hypercholesteremia   . Type II diabetes mellitus   . Diabetic peripheral neuropathy     "chronic" (05/27/2012)  . Stroke 05/2012    Subacute, lacunar infarcts within the left basal ganglia and posterior limp of the left internal capsule/thalamus; "RUE; both feet weak" (05/27/2012)  . Anxiety    Past Surgical History  Procedure Date  . Hernia repair 01/05/2004    "belly button" (05/27/2012)   History reviewed. No pertinent family history. Social History:  reports that he has been smoking Cigarettes.  He has a 90 pack-year smoking history. He has never used smokeless tobacco. He reports that he does not drink alcohol or use illicit drugs. Allergies:  Allergies  Allergen Reactions  . Flexeril (Cyclobenzaprine Hcl) Other (See Comments)    "whole body  Tremors" (05/27/2012)   No prescriptions prior to admission    Home: Home Living Lives With: Friend(s) Available Help at Discharge: Friend(s) Type of Home: Mobile home Home Access: Stairs to enter;Ramped entrance Home Layout: One level Bathroom Shower/Tub: Product/process development scientist: Standard Home Adaptive Equipment: Straight cane   Functional History: Prior Function Able to Take Stairs?: Yes Driving: Yes Vocation: On disability  Functional Status:  Mobility: Bed Mobility Bed Mobility: Supine to Sit;Sitting - Scoot to Edge of Bed Rolling Left: 1: +1 Total assist;With rail Left Sidelying to Sit: 1: +2 Total assist;With rails;HOB flat Left Sidelying to Sit: Patient Percentage: 40% Supine to Sit: 2: Max assist Sitting - Scoot to [employer] of Bed: 2: Max assist Sit to Supine: 1: +2 Total assist Sit to Supine: Patient Percentage: 30% Transfers Transfers: Sit to Stand;Stand to [employer] Transfers Sit to Stand: 1: +  2 Total assist;With upper  extremity assist;From bed Sit to Stand: Patient Percentage: 40% Stand to Sit: 1: +2 Total assist;With upper extremity assist;With armrests;To chair/3-in-1 Stand to Sit: Patient Percentage: 60% Stand Pivot Transfers: 1: +2 Total assist Stand Pivot Transfers: Patient Percentage: 60% Ambulation/Gait Ambulation/Gait Assistance: 2: Max assist Ambulation Distance (Feet): 2 Feet Assistive device: Rolling walker Ambulation/Gait Assistance Details: ambulated forward about 2 steps, then backwards to bed, secondary to severe right lean on PT when lifting right foot to move for ambulation. Gait Pattern: Lateral trunk lean to right    ADL: ADL Eating/Feeding: Moderate assistance (provided red foam elevated surface and pillow positioning) Where Assessed - Eating/Feeding: Chair Grooming: Moderate assistance;Wash/dry face Where Assessed - Grooming: Supported sitting Upper Body Dressing: Maximal assistance Where Assessed - Upper Body Dressing: Supported sitting Lower Body Dressing: Maximal assistance Where Assessed - Lower Body Dressing: Supported sitting Toilet Transfer: +2 Total assistance Toilet Transfer Method: Sit to Loss adjuster, chartered: Raised toilet seat with arms (or 3-in-1 over toilet) Equipment Used: Gait belt (hand held (A)) Transfers/Ambulation Related to ADLs: Pt required v/c and visual cue for upright posture. Pt following single commands. Pt side stepping to left side with increased time. ADL Comments: Pt supine on arrival. Pt oriented to place, month, DOB however unable to describe situation and reason for admission. Pt sitting Eob with right lean. Pt provided facilitation at trunk for upright posture. Pt is able to perform hip flexion and extension during session controlling eob sitting with min (A) once. Pt stand pivot to chair for upright sitting self feeding. Pt with decreased motor control of right UE. Pt provided red foam for spoon. P attempting to use Rt UE and needs Lt  UE to steady right hand. Pt requires cueing and (A) for all self feeding.  Cognition: Cognition Overall Cognitive Status: Impaired Arousal/Alertness: Awake/alert Orientation Level: Oriented X4 Attention: Sustained Sustained Attention: Impaired Sustained Attention Impairment: Verbal basic;Functional basic Memory: Impaired Memory Impairment: Storage deficit;Retrieval deficit;Decreased recall of new information;Decreased short term memory;Prospective memory Awareness: Impaired Awareness Impairment: Anticipatory impairment;Intellectual impairment;Emergent impairment Problem Solving: Impaired Problem Solving Impairment: Verbal basic;Functional basic Executive Function: Self Monitoring;Self Correcting Self Correcting: Impaired Self Correcting Impairment: Verbal basic;Functional basic Safety/Judgment: Impaired Cognition Overall Cognitive Status: Impaired Area of Impairment: Memory;Awareness of deficits;Problem solving Arousal/Alertness: Awake/alert Orientation Level: Disoriented to;Situation Behavior During Session: Flat affect Current Attention Level: Sustained Attention - Other Comments: needs multiple cues to correct loss of balance, able to attend to mobility task for approx 3 min with cues for safety Memory:  (unable to recall reason in hospital) Memory Deficits: Pt is unable to remember reason for admission or test results Awareness of Deficits: Pt with decreased Rt side awareness.  Requires cues to correct LOB to Rt side.   Problem Solving: Pt requires max directional cues for sequencing & technique.     Blood pressure 149/89, pulse 73, temperature 97.7 F (36.5 C), temperature source Oral, resp. rate 18, SpO2 96.00%. Physical Exam  Vitals reviewed.  HENT:  Head: Normocephalic. Oral mucosa pink and moist Eyes:  Pupils round and reactive to light  Neck: Neck supple. No thyromegaly present. No jvd Cardiovascular: Normal rate and regular rhythm. No murmur Pulmonary/Chest:  Effort normal and breath sounds normal. He has no wheezes. No distress Abdominal: Soft. Bowel sounds are normal. There is no tenderness.  Musculoskeletal: He exhibits no edema. Back exhibits tenderness to rom and palpation in the lumbar area  Neurological: He is alert.  Patient is a poor historian. He  was able to state his appropriate age and date of birth. He could not name the hospital or situation. He did follow simple commands. Very distracted. Affect flat. RUE is 2- to ?2/5 but remains inconsistent. RLE was trace to 1/5 but inconsistent still due to his distractibility. Sensation 1/2 Right arm,leg. Did withdraw to pain. Toes non reactive.  Infrequently makes eye contact. Right facial droop. Speech slurred. Psych: affect extremely flat. Appears depressed. Skin: Skin is warm and dry.    Results for orders placed during the hospital encounter of 05/27/12 (from the past 48 hour(s))  GLUCOSE, CAPILLARY     Status: Normal   Collection Time   05/28/12  6:35 AM      Component Value Range Comment   Glucose-Capillary 82  70 - 99 mg/dL   BASIC METABOLIC PANEL     Status: Abnormal   Collection Time   05/28/12  8:35 AM      Component Value Range Comment   Sodium 143  135 - 145 mEq/L    Potassium 3.6  3.5 - 5.1 mEq/L    Chloride 106  96 - 112 mEq/L    CO2 26  19 - 32 mEq/L    Glucose, Bld 148 (*) 70 - 99 mg/dL    BUN 9  6 - 23 mg/dL    Creatinine, Ser 0.83  0.50 - 1.35 mg/dL    Calcium 8.6  8.4 - 10.5 mg/dL    GFR calc non Af Amer >90  >90 mL/min    GFR calc Af Amer >90  >90 mL/min   TSH     Status: Normal   Collection Time   05/28/12  8:35 AM      Component Value Range Comment   TSH 1.157  0.350 - 4.500 uIU/mL   VITAMIN B12     Status: Normal   Collection Time   05/28/12  8:35 AM      Component Value Range Comment   Vitamin B-12 573  211 - 911 pg/mL   RPR     Status: Normal   Collection Time   05/28/12  8:35 AM      Component Value Range Comment   RPR NON REACTIVE  NON REACTIVE     HIV ANTIBODY (ROUTINE TESTING)     Status: Normal   Collection Time   05/28/12  8:35 AM      Component Value Range Comment   HIV NON REACTIVE  NON REACTIVE   FOLATE     Status: Normal   Collection Time   05/28/12  8:35 AM      Component Value Range Comment   Folate 10.8     GLUCOSE, CAPILLARY     Status: Abnormal   Collection Time   05/28/12 12:20 PM      Component Value Range Comment   Glucose-Capillary 153 (*) 70 - 99 mg/dL    Comment 1 Documented in Chart      Comment 2 Notify RN     GLUCOSE, CAPILLARY     Status: Normal   Collection Time   05/28/12  5:10 PM      Component Value Range Comment   Glucose-Capillary 96  70 - 99 mg/dL    Comment 1 Documented in Chart      Comment 2 Notify RN     GLUCOSE, CAPILLARY     Status: Abnormal   Collection Time   05/28/12 11:26 PM      Component Value Range Comment  Glucose-Capillary 136 (*) 70 - 99 mg/dL    Comment 1 Notify RN     GLUCOSE, CAPILLARY     Status: Abnormal   Collection Time   05/29/12  7:17 AM      Component Value Range Comment   Glucose-Capillary 124 (*) 70 - 99 mg/dL    Comment 1 Notify RN     CLOSTRIDIUM DIFFICILE BY PCR     Status: Abnormal   Collection Time   05/29/12 10:43 AM      Component Value Range Comment   C difficile by pcr POSITIVE (*) NEGATIVE   CBC WITH DIFFERENTIAL     Status: Abnormal   Collection Time   05/29/12  1:27 PM      Component Value Range Comment   WBC 10.6 (*) 4.0 - 10.5 K/uL    RBC 4.84  4.22 - 5.81 MIL/uL    Hemoglobin 16.7  13.0 - 17.0 g/dL    HCT 47.0  39.0 - 52.0 %    MCV 97.1  78.0 - 100.0 fL    MCH 34.5 (*) 26.0 - 34.0 pg    MCHC 35.5  30.0 - 36.0 g/dL    RDW 13.9  11.5 - 15.5 %    Platelets 199  150 - 400 K/uL    Neutrophils Relative 77  43 - 77 %    Neutro Abs 8.1 (*) 1.7 - 7.7 K/uL    Lymphocytes Relative 15  12 - 46 %    Lymphs Abs 1.6  0.7 - 4.0 K/uL    Monocytes Relative 7  3 - 12 %    Monocytes Absolute 0.8  0.1 - 1.0 K/uL    Eosinophils Relative 1  0 - 5 %     Eosinophils Absolute 0.1  0.0 - 0.7 K/uL    Basophils Relative 0  0 - 1 %    Basophils Absolute 0.0  0.0 - 0.1 K/uL    Mr Thoracic Spine W Wo Contrast  05/29/2012  *RADIOLOGY REPORT*  Clinical Data:  61 year old male with back pain, incontinence. Diabetes.  Lower extremity pain.  Contrast: 20 ml MultiHance.  MRI THORACIC SPINE WITHOUT AND WITH CONTRAST  Technique: Multiplanar and multiecho pulse sequences of the thoracic spine were obtained without and  with intravenous contrast.  Comparison: Brain MRI 05/27/2012.  CT abdomen and pelvis 07/20/2010 and earlier.  Findings:  Limited sagittal imaging of most of the cervical spine is within normal limits.  Thoracic vertebral height and alignment is within normal limits. No marrow edema or evidence of acute osseous abnormality.  No abnormal enhancement identified.  Spinal cord signal is within normal limits at all visualized levels.  Conus medullaris better depicted on lumbar images below.  Mild for age thoracic spine degeneration.  The only focal disc herniation identified is a small left paracentral disc protrusion at T10-T11.  No significant spinal stenosis results.  No foraminal stenosis identified.  Dependent atelectasis.  Chronic right adrenal adenoma partially visible.  IMPRESSION: 1.  Mild for age thoracic spine degenerative changes with no spinal stenosis.  No acute findings identified in the thoracic spine. 2.  See lumbar findings below.  MRI LUMBAR SPINE WITHOUT AND WITH CONTRAST  Technique: Multiplanar and multiecho pulse sequences of the lumbar spine were obtained without and with intravenous contrast.  Findings:  Normal lumbar segmentation.  Trace retrolisthesis of L5 on S1, otherwise normal vertebral height and alignment.  Marrow signal within normal limits.  There is thin linear enhancement of the  right S2 nerve root throughout the cauda equina (series 20 image 7, series 21 image 32).  No associated focal impingement of the nerve root is  identified, but this might be related to more generalized spinal stenosis as described below.  No abnormal enhancement identified.   Visualized lower thoracic spinal cord is normal with conus medularis at L1-L2.  Negative visualized abdominal viscera. Visualized paraspinal soft tissues are within normal limits.  T12-L1:  Negative.  L1-L2:  Negative.  L2-L3:  Mild disc desiccation and disc bulge.  Mild facet and ligament flavum hypertrophy.  Borderline to mild spinal stenosis.  L3-L4:  Mostly far lateral bilateral disc bulges and endplate spurring.  Mild to moderate facet and ligament flavum hypertrophy. Borderline to mild spinal stenosis.  Mild right greater than left L3 foraminal stenosis.  L4-L5:  Right eccentric circumferential disc osteophyte complex.  A small right foraminal annular tear.  Moderate facet and ligament flavum hypertrophy also greater on the right.  Borderline to mild spinal stenosis.  Mild right lateral recess stenosis.  Moderate right L4 foraminal stenosis.  L5-S1:  Disc space loss.  Suspect vacuum disc phenomena.  Left eccentric circumferential disc osteophyte complex.  Incidental left L5 inferior endplate benign vertebral hemangioma.  Mild facet and ligament flavum hypertrophy.  Mild left greater than right lateral recess stenosis.  No significant spinal stenosis.  Moderate left L5 foraminal stenosis.  IMPRESSION: 1. 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  Borderline to mild multifactorial spinal stenosis from L2-L3 to L4-L5. 2.  Multifactorial moderate right L4 and left L5 neural foraminal stenosis.  Mild lumbar foraminal stenosis elsewhere. 3.  Thin linear enhancement of the right S2 nerve root throughout the cauda equina, favor related to the above degenerative changes. No other abnormal enhancement.   Original Report Authenticated By: Roselyn Reef, M.D.    Mr Lumbar Spine W Wo Contrast  05/29/2012  *RADIOLOGY REPORT*  Clinical Data:  61 year old male with back pain,  incontinence. Diabetes.  Lower extremity pain.  Contrast: 20 ml MultiHance.  MRI THORACIC SPINE WITHOUT AND WITH CONTRAST  Technique: Multiplanar and multiecho pulse sequences of the thoracic spine were obtained without and  with intravenous contrast.  Comparison: Brain MRI 05/27/2012.  CT abdomen and pelvis 07/20/2010 and earlier.  Findings:  Limited sagittal imaging of most of the cervical spine is within normal limits.  Thoracic vertebral height and alignment is within normal limits. No marrow edema or evidence of acute osseous abnormality.  No abnormal enhancement identified.  Spinal cord signal is within normal limits at all visualized levels.  Conus medullaris better depicted on lumbar images below.  Mild for age thoracic spine degeneration.  The only focal disc herniation identified is a small left paracentral disc protrusion at T10-T11.  No significant spinal stenosis results.  No foraminal stenosis identified.  Dependent atelectasis.  Chronic right adrenal adenoma partially visible.  IMPRESSION: 1.  Mild for age thoracic spine degenerative changes with no spinal stenosis.  No acute findings identified in the thoracic spine. 2.  See lumbar findings below.  MRI LUMBAR SPINE WITHOUT AND WITH CONTRAST  Technique: Multiplanar and multiecho pulse sequences of the lumbar spine were obtained without and with intravenous contrast.  Findings:  Normal lumbar segmentation.  Trace retrolisthesis of L5 on S1, otherwise normal vertebral height and alignment.  Marrow signal within normal limits.  There is thin linear enhancement of the right S2 nerve root throughout the cauda equina (series 20 image 7, series 21 image 32).  No  associated focal impingement of the nerve root is identified, but this might be related to more generalized spinal stenosis as described below.  No abnormal enhancement identified.   Visualized lower thoracic spinal cord is normal with conus medularis at L1-L2.  Negative visualized abdominal viscera.  Visualized paraspinal soft tissues are within normal limits.  T12-L1:  Negative.  L1-L2:  Negative.  L2-L3:  Mild disc desiccation and disc bulge.  Mild facet and ligament flavum hypertrophy.  Borderline to mild spinal stenosis.  L3-L4:  Mostly far lateral bilateral disc bulges and endplate spurring.  Mild to moderate facet and ligament flavum hypertrophy. Borderline to mild spinal stenosis.  Mild right greater than left L3 foraminal stenosis.  L4-L5:  Right eccentric circumferential disc osteophyte complex.  A small right foraminal annular tear.  Moderate facet and ligament flavum hypertrophy also greater on the right.  Borderline to mild spinal stenosis.  Mild right lateral recess stenosis.  Moderate right L4 foraminal stenosis.  L5-S1:  Disc space loss.  Suspect vacuum disc phenomena.  Left eccentric circumferential disc osteophyte complex.  Incidental left L5 inferior endplate benign vertebral hemangioma.  Mild facet and ligament flavum hypertrophy.  Mild left greater than right lateral recess stenosis.  No significant spinal stenosis.  Moderate left L5 foraminal stenosis.  IMPRESSION: 1. 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  Borderline to mild multifactorial spinal stenosis from L2-L3 to L4-L5. 2.  Multifactorial moderate right L4 and left L5 neural foraminal stenosis.  Mild lumbar foraminal stenosis elsewhere. 3.  Thin linear enhancement of the right S2 nerve root throughout the cauda equina, favor related to the above degenerative changes. No other abnormal enhancement.   Original Report Authenticated By: Roselyn Reef, M.D.     Post Admission Physician Evaluation: 1. Functional deficits secondary  to thrombotic left PLIC into thalamus infarct. 2. Patient is admitted to receive collaborative, interdisciplinary care between the physiatrist, rehab nursing staff, and therapy team. 3. Patient's level of medical complexity and substantial therapy needs in context of that medical necessity cannot  be provided at a lesser intensity of care such as a SNF. 4. Patient has experienced substantial functional loss from his/her baseline which was documented above under the "Functional History" and "Functional Status" headings.  Judging by the patient's diagnosis, physical exam, and functional history, the patient has potential for functional progress which will result in measurable gains while on inpatient rehab.  These gains will be of substantial and practical use upon discharge  in facilitating mobility and self-care at the household level. 5. Physiatrist will provide 24 hour management of medical needs as well as oversight of the therapy plan/treatment and provide guidance as appropriate regarding the interaction of the two. 6. 24 hour rehab nursing will assist with bladder management, bowel management, safety, skin/wound care, disease management, medication administration, pain management and patient education  and help integrate therapy concepts, techniques,education, etc. 7. PT will assess and treat for:  Lower extremity strength, range of motion, stamina, balance, functional mobility, safety, adaptive techniques and equipment, CPT, NMR.  Goals are: min assist to supervision probably at a wheelchair level. 8. OT will assess and treat for: ADL's, functional mobility, safety, upper extremity strength, adaptive techniques and equipment, NMR, pain.   Goals are: minimal to moderate assist. 9. SLP will assess and treat for: cognition, communication.  Goals are: supervision to minimal assist. 10. Case Management and Social Worker will assess and treat for psychological issues and discharge planning. 11. Team conference will be held weekly  to assess progress toward goals and to determine barriers to discharge. 12. Patient will receive at least 3 hours of therapy per day at least 5 days per week. 13. ELOS: 2-3 weeks      Prognosis:  excellent   Medical Problem List and Plan: 1. Left thrombotic PLIC.  infarct 2. DVT Prophylaxis/Anticoagulation: Subcutaneous Lovenox. Monitor platelet counts any signs of bleeding 3. Pain Management: Hydrocodone as needed. Monitor with increased mobility 4. Neuropsych: This patient is not capable of making decisions on his/her own behalf. 5. Clostridium difficile. Flagyl 500 mg every 8 hours initiated 05/29/2012 6. Hypertension. Hydrochlorothiazide 12.5 mg daily. Monitor the increased mobility 7. Borderline diabetes mellitus. Latest hemoglobin A1c 5.3. Accu-Cheks have been discontinued. But sugars been within acceptable limits 8. Tobacco abuse. NicoDerm patch. Provide counseling. 9. Hyperlipidemia. Zocor  05/30/2012  Oval Linsey, MD

## 2012-05-30 NOTE — H&P (View-Only) (Signed)
Physical Medicine and Rehabilitation Admission H&P    No chief complaint on file. : [id number] H Ciccone is a 61 y.o. right-handed male with history of borderline diabetes mellitus and tobacco abuse as well as hypertension. Patient on no scheduled medications prior to admission. Admitted 05/27/2012 with right-sided weakness and altered mental status. 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 MRA of the head with moderate small vessel disease moderate proximal stenosis of the right P1 segment without any other significant proximal stenosis or aneurysm. Echocardiogram with ejection fraction of 60% and normal systolic function. Carotid Dopplers with no ICA stenosis. Maintained on aspirin therapy for stroke prophylaxis as well as subcutaneous Lovenox for DVT prophylaxis. A NicoDerm patch was introduced for history of tobacco abuse. 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 There was no obvious cord compression.. Clostridium difficile specimen obtained 05/29/2012 that was positive and patient initiated on Flagyl. Physical therapy evaluation completed an ongoing with recommendations of physical medicine rehabilitation consult to consider inpatient rehabilitation services. Patient was felt to be a good candidate for inpatient rehabilitation services was admitted for comprehensive rehabilitation  program   Review of Systems  Gastrointestinal:  Bowel incontinence  Musculoskeletal: Positive for myalgias   Past Medical History  Diagnosis Date  . Hypertension   . Hypercholesteremia   . Type II diabetes mellitus   . Diabetic peripheral neuropathy     "chronic" (05/27/2012)  . Stroke 05/2012    Subacute, lacunar infarcts within the left basal ganglia and posterior limp of the left internal capsule/thalamus; "RUE; both feet weak" (05/27/2012)  . Anxiety    Past Surgical History  Procedure Date  . Hernia repair 01/05/2004    "belly button" (05/27/2012)   History reviewed. No pertinent family history. Social History:  reports that he has been smoking Cigarettes.  He has a 90 pack-year smoking history. He has never used smokeless tobacco. He reports that he does not drink alcohol or use illicit drugs. Allergies:  Allergies  Allergen Reactions  . Flexeril (Cyclobenzaprine Hcl) Other (See Comments)    "whole body  Tremors" (05/27/2012)   No prescriptions prior to admission    Home: Home Living Lives With: Friend(s) Available Help at Discharge: Friend(s) Type of Home: Mobile home Home Access: Stairs to enter;Ramped entrance Home Layout: One level Bathroom Shower/Tub: Product/process development scientist: Standard Home Adaptive Equipment: Straight cane   Functional History: Prior Function Able to Take Stairs?: Yes Driving: Yes Vocation: On disability  Functional Status:  Mobility: Bed Mobility Bed Mobility: Supine to Sit;Sitting - Scoot to Edge of Bed Rolling Left: 1: +1 Total assist;With rail Left Sidelying to Sit: 1: +2 Total assist;With rails;HOB flat Left Sidelying to Sit: Patient Percentage: 40% Supine to Sit: 2: Max assist Sitting - Scoot to [employer] of Bed: 2: Max assist Sit to Supine: 1: +2 Total assist Sit to Supine: Patient Percentage: 30% Transfers Transfers: Sit to Stand;Stand to [employer] Transfers Sit to Stand: 1: +  2 Total assist;With upper  extremity assist;From bed Sit to Stand: Patient Percentage: 40% Stand to Sit: 1: +2 Total assist;With upper extremity assist;With armrests;To chair/3-in-1 Stand to Sit: Patient Percentage: 60% Stand Pivot Transfers: 1: +2 Total assist Stand Pivot Transfers: Patient Percentage: 60% Ambulation/Gait Ambulation/Gait Assistance: 2: Max assist Ambulation Distance (Feet): 2 Feet Assistive device: Rolling walker Ambulation/Gait Assistance Details: ambulated forward about 2 steps, then backwards to bed, secondary to severe right lean on PT when lifting right foot to move for ambulation. Gait Pattern: Lateral trunk lean to right    ADL: ADL Eating/Feeding: Moderate assistance (provided red foam elevated surface and pillow positioning) Where Assessed - Eating/Feeding: Chair Grooming: Moderate assistance;Wash/dry face Where Assessed - Grooming: Supported sitting Upper Body Dressing: Maximal assistance Where Assessed - Upper Body Dressing: Supported sitting Lower Body Dressing: Maximal assistance Where Assessed - Lower Body Dressing: Supported sitting Toilet Transfer: +2 Total assistance Toilet Transfer Method: Sit to Loss adjuster, chartered: Raised toilet seat with arms (or 3-in-1 over toilet) Equipment Used: Gait belt (hand held (A)) Transfers/Ambulation Related to ADLs: Pt required v/c and visual cue for upright posture. Pt following single commands. Pt side stepping to left side with increased time. ADL Comments: Pt supine on arrival. Pt oriented to place, month, DOB however unable to describe situation and reason for admission. Pt sitting Eob with right lean. Pt provided facilitation at trunk for upright posture. Pt is able to perform hip flexion and extension during session controlling eob sitting with min (A) once. Pt stand pivot to chair for upright sitting self feeding. Pt with decreased motor control of right UE. Pt provided red foam for spoon. P attempting to use Rt UE and needs Lt  UE to steady right hand. Pt requires cueing and (A) for all self feeding.  Cognition: Cognition Overall Cognitive Status: Impaired Arousal/Alertness: Awake/alert Orientation Level: Oriented X4 Attention: Sustained Sustained Attention: Impaired Sustained Attention Impairment: Verbal basic;Functional basic Memory: Impaired Memory Impairment: Storage deficit;Retrieval deficit;Decreased recall of new information;Decreased short term memory;Prospective memory Awareness: Impaired Awareness Impairment: Anticipatory impairment;Intellectual impairment;Emergent impairment Problem Solving: Impaired Problem Solving Impairment: Verbal basic;Functional basic Executive Function: Self Monitoring;Self Correcting Self Correcting: Impaired Self Correcting Impairment: Verbal basic;Functional basic Safety/Judgment: Impaired Cognition Overall Cognitive Status: Impaired Area of Impairment: Memory;Awareness of deficits;Problem solving Arousal/Alertness: Awake/alert Orientation Level: Disoriented to;Situation Behavior During Session: Flat affect Current Attention Level: Sustained Attention - Other Comments: needs multiple cues to correct loss of balance, able to attend to mobility task for approx 3 min with cues for safety Memory:  (unable to recall reason in hospital) Memory Deficits: Pt is unable to remember reason for admission or test results Awareness of Deficits: Pt with decreased Rt side awareness.  Requires cues to correct LOB to Rt side.   Problem Solving: Pt requires max directional cues for sequencing & technique.     Blood pressure 149/89, pulse 73, temperature 97.7 F (36.5 C), temperature source Oral, resp. rate 18, SpO2 96.00%. Physical Exam  Vitals reviewed.  HENT:  Head: Normocephalic. Oral mucosa pink and moist Eyes:  Pupils round and reactive to light  Neck: Neck supple. No thyromegaly present. No jvd Cardiovascular: Normal rate and regular rhythm. No murmur Pulmonary/Chest:  Effort normal and breath sounds normal. He has no wheezes. No distress Abdominal: Soft. Bowel sounds are normal. There is no tenderness.  Musculoskeletal: He exhibits no edema. Back exhibits tenderness to rom and palpation in the lumbar area  Neurological: He is alert.  Patient is a poor historian. He  was able to state his appropriate age and date of birth. He could not name the hospital or situation. He did follow simple commands. Very distracted. Affect flat. RUE is 2- to ?2/5 but remains inconsistent. RLE was trace to 1/5 but inconsistent still due to his distractibility. Sensation 1/2 Right arm,leg. Did withdraw to pain. Toes non reactive.  Infrequently makes eye contact. Right facial droop. Speech slurred. Psych: affect extremely flat. Appears depressed. Skin: Skin is warm and dry.    Results for orders placed during the hospital encounter of 05/27/12 (from the past 48 hour(s))  GLUCOSE, CAPILLARY     Status: Normal   Collection Time   05/28/12  6:35 AM      Component Value Range Comment   Glucose-Capillary 82  70 - 99 mg/dL   BASIC METABOLIC PANEL     Status: Abnormal   Collection Time   05/28/12  8:35 AM      Component Value Range Comment   Sodium 143  135 - 145 mEq/L    Potassium 3.6  3.5 - 5.1 mEq/L    Chloride 106  96 - 112 mEq/L    CO2 26  19 - 32 mEq/L    Glucose, Bld 148 (*) 70 - 99 mg/dL    BUN 9  6 - 23 mg/dL    Creatinine, Ser 0.83  0.50 - 1.35 mg/dL    Calcium 8.6  8.4 - 10.5 mg/dL    GFR calc non Af Amer >90  >90 mL/min    GFR calc Af Amer >90  >90 mL/min   TSH     Status: Normal   Collection Time   05/28/12  8:35 AM      Component Value Range Comment   TSH 1.157  0.350 - 4.500 uIU/mL   VITAMIN B12     Status: Normal   Collection Time   05/28/12  8:35 AM      Component Value Range Comment   Vitamin B-12 573  211 - 911 pg/mL   RPR     Status: Normal   Collection Time   05/28/12  8:35 AM      Component Value Range Comment   RPR NON REACTIVE  NON REACTIVE     HIV ANTIBODY (ROUTINE TESTING)     Status: Normal   Collection Time   05/28/12  8:35 AM      Component Value Range Comment   HIV NON REACTIVE  NON REACTIVE   FOLATE     Status: Normal   Collection Time   05/28/12  8:35 AM      Component Value Range Comment   Folate 10.8     GLUCOSE, CAPILLARY     Status: Abnormal   Collection Time   05/28/12 12:20 PM      Component Value Range Comment   Glucose-Capillary 153 (*) 70 - 99 mg/dL    Comment 1 Documented in Chart      Comment 2 Notify RN     GLUCOSE, CAPILLARY     Status: Normal   Collection Time   05/28/12  5:10 PM      Component Value Range Comment   Glucose-Capillary 96  70 - 99 mg/dL    Comment 1 Documented in Chart      Comment 2 Notify RN     GLUCOSE, CAPILLARY     Status: Abnormal   Collection Time   05/28/12 11:26 PM      Component Value Range Comment  Glucose-Capillary 136 (*) 70 - 99 mg/dL    Comment 1 Notify RN     GLUCOSE, CAPILLARY     Status: Abnormal   Collection Time   05/29/12  7:17 AM      Component Value Range Comment   Glucose-Capillary 124 (*) 70 - 99 mg/dL    Comment 1 Notify RN     CLOSTRIDIUM DIFFICILE BY PCR     Status: Abnormal   Collection Time   05/29/12 10:43 AM      Component Value Range Comment   C difficile by pcr POSITIVE (*) NEGATIVE   CBC WITH DIFFERENTIAL     Status: Abnormal   Collection Time   05/29/12  1:27 PM      Component Value Range Comment   WBC 10.6 (*) 4.0 - 10.5 K/uL    RBC 4.84  4.22 - 5.81 MIL/uL    Hemoglobin 16.7  13.0 - 17.0 g/dL    HCT 47.0  39.0 - 52.0 %    MCV 97.1  78.0 - 100.0 fL    MCH 34.5 (*) 26.0 - 34.0 pg    MCHC 35.5  30.0 - 36.0 g/dL    RDW 13.9  11.5 - 15.5 %    Platelets 199  150 - 400 K/uL    Neutrophils Relative 77  43 - 77 %    Neutro Abs 8.1 (*) 1.7 - 7.7 K/uL    Lymphocytes Relative 15  12 - 46 %    Lymphs Abs 1.6  0.7 - 4.0 K/uL    Monocytes Relative 7  3 - 12 %    Monocytes Absolute 0.8  0.1 - 1.0 K/uL    Eosinophils Relative 1  0 - 5 %     Eosinophils Absolute 0.1  0.0 - 0.7 K/uL    Basophils Relative 0  0 - 1 %    Basophils Absolute 0.0  0.0 - 0.1 K/uL    Mr Thoracic Spine W Wo Contrast  05/29/2012  *RADIOLOGY REPORT*  Clinical Data:  61 year old male with back pain, incontinence. Diabetes.  Lower extremity pain.  Contrast: 20 ml MultiHance.  MRI THORACIC SPINE WITHOUT AND WITH CONTRAST  Technique: Multiplanar and multiecho pulse sequences of the thoracic spine were obtained without and  with intravenous contrast.  Comparison: Brain MRI 05/27/2012.  CT abdomen and pelvis 07/20/2010 and earlier.  Findings:  Limited sagittal imaging of most of the cervical spine is within normal limits.  Thoracic vertebral height and alignment is within normal limits. No marrow edema or evidence of acute osseous abnormality.  No abnormal enhancement identified.  Spinal cord signal is within normal limits at all visualized levels.  Conus medullaris better depicted on lumbar images below.  Mild for age thoracic spine degeneration.  The only focal disc herniation identified is a small left paracentral disc protrusion at T10-T11.  No significant spinal stenosis results.  No foraminal stenosis identified.  Dependent atelectasis.  Chronic right adrenal adenoma partially visible.  IMPRESSION: 1.  Mild for age thoracic spine degenerative changes with no spinal stenosis.  No acute findings identified in the thoracic spine. 2.  See lumbar findings below.  MRI LUMBAR SPINE WITHOUT AND WITH CONTRAST  Technique: Multiplanar and multiecho pulse sequences of the lumbar spine were obtained without and with intravenous contrast.  Findings:  Normal lumbar segmentation.  Trace retrolisthesis of L5 on S1, otherwise normal vertebral height and alignment.  Marrow signal within normal limits.  There is thin linear enhancement of the  right S2 nerve root throughout the cauda equina (series 20 image 7, series 21 image 32).  No associated focal impingement of the nerve root is  identified, but this might be related to more generalized spinal stenosis as described below.  No abnormal enhancement identified.   Visualized lower thoracic spinal cord is normal with conus medularis at L1-L2.  Negative visualized abdominal viscera. Visualized paraspinal soft tissues are within normal limits.  T12-L1:  Negative.  L1-L2:  Negative.  L2-L3:  Mild disc desiccation and disc bulge.  Mild facet and ligament flavum hypertrophy.  Borderline to mild spinal stenosis.  L3-L4:  Mostly far lateral bilateral disc bulges and endplate spurring.  Mild to moderate facet and ligament flavum hypertrophy. Borderline to mild spinal stenosis.  Mild right greater than left L3 foraminal stenosis.  L4-L5:  Right eccentric circumferential disc osteophyte complex.  A small right foraminal annular tear.  Moderate facet and ligament flavum hypertrophy also greater on the right.  Borderline to mild spinal stenosis.  Mild right lateral recess stenosis.  Moderate right L4 foraminal stenosis.  L5-S1:  Disc space loss.  Suspect vacuum disc phenomena.  Left eccentric circumferential disc osteophyte complex.  Incidental left L5 inferior endplate benign vertebral hemangioma.  Mild facet and ligament flavum hypertrophy.  Mild left greater than right lateral recess stenosis.  No significant spinal stenosis.  Moderate left L5 foraminal stenosis.  IMPRESSION: 1. 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  Borderline to mild multifactorial spinal stenosis from L2-L3 to L4-L5. 2.  Multifactorial moderate right L4 and left L5 neural foraminal stenosis.  Mild lumbar foraminal stenosis elsewhere. 3.  Thin linear enhancement of the right S2 nerve root throughout the cauda equina, favor related to the above degenerative changes. No other abnormal enhancement.   Original Report Authenticated By: Roselyn Reef, M.D.    Mr Lumbar Spine W Wo Contrast  05/29/2012  *RADIOLOGY REPORT*  Clinical Data:  61 year old male with back pain,  incontinence. Diabetes.  Lower extremity pain.  Contrast: 20 ml MultiHance.  MRI THORACIC SPINE WITHOUT AND WITH CONTRAST  Technique: Multiplanar and multiecho pulse sequences of the thoracic spine were obtained without and  with intravenous contrast.  Comparison: Brain MRI 05/27/2012.  CT abdomen and pelvis 07/20/2010 and earlier.  Findings:  Limited sagittal imaging of most of the cervical spine is within normal limits.  Thoracic vertebral height and alignment is within normal limits. No marrow edema or evidence of acute osseous abnormality.  No abnormal enhancement identified.  Spinal cord signal is within normal limits at all visualized levels.  Conus medullaris better depicted on lumbar images below.  Mild for age thoracic spine degeneration.  The only focal disc herniation identified is a small left paracentral disc protrusion at T10-T11.  No significant spinal stenosis results.  No foraminal stenosis identified.  Dependent atelectasis.  Chronic right adrenal adenoma partially visible.  IMPRESSION: 1.  Mild for age thoracic spine degenerative changes with no spinal stenosis.  No acute findings identified in the thoracic spine. 2.  See lumbar findings below.  MRI LUMBAR SPINE WITHOUT AND WITH CONTRAST  Technique: Multiplanar and multiecho pulse sequences of the lumbar spine were obtained without and with intravenous contrast.  Findings:  Normal lumbar segmentation.  Trace retrolisthesis of L5 on S1, otherwise normal vertebral height and alignment.  Marrow signal within normal limits.  There is thin linear enhancement of the right S2 nerve root throughout the cauda equina (series 20 image 7, series 21 image 32).  No  associated focal impingement of the nerve root is identified, but this might be related to more generalized spinal stenosis as described below.  No abnormal enhancement identified.   Visualized lower thoracic spinal cord is normal with conus medularis at L1-L2.  Negative visualized abdominal viscera.  Visualized paraspinal soft tissues are within normal limits.  T12-L1:  Negative.  L1-L2:  Negative.  L2-L3:  Mild disc desiccation and disc bulge.  Mild facet and ligament flavum hypertrophy.  Borderline to mild spinal stenosis.  L3-L4:  Mostly far lateral bilateral disc bulges and endplate spurring.  Mild to moderate facet and ligament flavum hypertrophy. Borderline to mild spinal stenosis.  Mild right greater than left L3 foraminal stenosis.  L4-L5:  Right eccentric circumferential disc osteophyte complex.  A small right foraminal annular tear.  Moderate facet and ligament flavum hypertrophy also greater on the right.  Borderline to mild spinal stenosis.  Mild right lateral recess stenosis.  Moderate right L4 foraminal stenosis.  L5-S1:  Disc space loss.  Suspect vacuum disc phenomena.  Left eccentric circumferential disc osteophyte complex.  Incidental left L5 inferior endplate benign vertebral hemangioma.  Mild facet and ligament flavum hypertrophy.  Mild left greater than right lateral recess stenosis.  No significant spinal stenosis.  Moderate left L5 foraminal stenosis.  IMPRESSION: 1. 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  Borderline to mild multifactorial spinal stenosis from L2-L3 to L4-L5. 2.  Multifactorial moderate right L4 and left L5 neural foraminal stenosis.  Mild lumbar foraminal stenosis elsewhere. 3.  Thin linear enhancement of the right S2 nerve root throughout the cauda equina, favor related to the above degenerative changes. No other abnormal enhancement.   Original Report Authenticated By: Roselyn Reef, M.D.     Post Admission Physician Evaluation: 1. Functional deficits secondary  to thrombotic left PLIC into thalamus infarct. 2. Patient is admitted to receive collaborative, interdisciplinary care between the physiatrist, rehab nursing staff, and therapy team. 3. Patient's level of medical complexity and substantial therapy needs in context of that medical necessity cannot  be provided at a lesser intensity of care such as a SNF. 4. Patient has experienced substantial functional loss from his/her baseline which was documented above under the "Functional History" and "Functional Status" headings.  Judging by the patient's diagnosis, physical exam, and functional history, the patient has potential for functional progress which will result in measurable gains while on inpatient rehab.  These gains will be of substantial and practical use upon discharge  in facilitating mobility and self-care at the household level. 5. Physiatrist will provide 24 hour management of medical needs as well as oversight of the therapy plan/treatment and provide guidance as appropriate regarding the interaction of the two. 6. 24 hour rehab nursing will assist with bladder management, bowel management, safety, skin/wound care, disease management, medication administration, pain management and patient education  and help integrate therapy concepts, techniques,education, etc. 7. PT will assess and treat for:  Lower extremity strength, range of motion, stamina, balance, functional mobility, safety, adaptive techniques and equipment, CPT, NMR.  Goals are: min assist to supervision probably at a wheelchair level. 8. OT will assess and treat for: ADL's, functional mobility, safety, upper extremity strength, adaptive techniques and equipment, NMR, pain.   Goals are: minimal to moderate assist. 9. SLP will assess and treat for: cognition, communication.  Goals are: supervision to minimal assist. 10. Case Management and Social Worker will assess and treat for psychological issues and discharge planning. 11. Team conference will be held weekly  to assess progress toward goals and to determine barriers to discharge. 12. Patient will receive at least 3 hours of therapy per day at least 5 days per week. 13. ELOS: 2-3 weeks      Prognosis:  excellent   Medical Problem List and Plan: 1. Left thrombotic PLIC.  infarct 2. DVT Prophylaxis/Anticoagulation: Subcutaneous Lovenox. Monitor platelet counts any signs of bleeding 3. Pain Management: Hydrocodone as needed. Monitor with increased mobility 4. Neuropsych: This patient is not capable of making decisions on his/her own behalf. 5. Clostridium difficile. Flagyl 500 mg every 8 hours initiated 05/29/2012 6. Hypertension. Hydrochlorothiazide 12.5 mg daily. Monitor the increased mobility 7. Borderline diabetes mellitus. Latest hemoglobin A1c 5.3. Accu-Cheks have been discontinued. But sugars been within acceptable limits 8. Tobacco abuse. NicoDerm patch. Provide counseling. 9. Hyperlipidemia. Zocor  05/30/2012  Oval Linsey, MD

## 2012-05-30 NOTE — Care Management Note (Signed)
    Page 1 of 1   05/30/2012     12:42:56 PM   CARE MANAGEMENT NOTE 05/30/2012  Patient:  Eric Scott, Eric Scott   Account Number:  [id number]  Date Initiated:  05/30/2012  Documentation initiated by:  Tomi Bamberger  Subjective/Objective Assessment:   dx cva  admit-     Action/Plan:   pt eval- recs CIR   Anticipated DC Date:  05/30/2012   Anticipated DC Plan:  IP REHAB FACILITY      DC Planning Services  CM consult      PAC Choice  IP REHAB   Choice offered to / List presented to:             Status of service:  Completed, signed off Medicare Important Message given?   (If response is "NO", the following Medicare IM given date fields will be blank) Date Medicare IM given:   Date Additional Medicare IM given:    Discharge Disposition:  IP REHAB FACILITY  Per UR Regulation:    If discussed at [place] Length of Stay Meetings, dates discussed:    Comments:  05/30/12 12:42 Tomi Bamberger RN, BSN [phone] patient is for DC to CIR today .

## 2012-05-30 NOTE — Progress Notes (Signed)
Pt admitted to CIR. Pt's wife present and was able to provide contact numbers. We discussed d/c plans for patient. She reports that she is available to provide 24/7 assist/supervision if needed after d/c. She is aware that pt needs clothes and shoes for participating in therapies.

## 2012-05-30 NOTE — Progress Notes (Signed)
Subjective: Diarrhea is improved.  Denies abdominal pain.  He is ready to go to rehab and agreeable.  No other complaints.   Objective: Vital signs in last 24 hours: Filed Vitals:   05/29/12 2203 05/30/12 0200 05/30/12 0600 05/30/12 1003  BP: 141/78 149/89 142/85 129/68  Pulse: 87 73 72 84  Temp: 98 F (36.7 C) 97.7 F (36.5 C) 97.9 F (36.6 C) 97.9 F (36.6 C)  TempSrc:    Oral  Resp: 18 18 18 16   SpO2: 98% 96% 96% 98%   Weight change:  No intake or output data in the 24 hours ending 05/30/12 1145 General: sitting in recliner, nad, alert and oriented to place, person  HEENT: [place]/at CV: RRR no rubs, murmurs, or gallops Lungs: ctab Abdomen: soft, ntnd, +normal bs  Extremities: warm, no cyanosis or edema  Neuro: CN 2-12 grossly intact, slight right facial droop, right lower extremity with 2/5 strength, decreased sensation RLE, right upper extremity 3+/5 strength   Lab Results: Basic Metabolic Panel:  Lab [id number] 05/28/12 0835  NA 141 143  K 2.9* 3.6  CL 100 106  CO2 25 26  GLUCOSE 197* 148*  BUN 13 9  CREATININE 1.03 0.83  CALCIUM 9.1 8.6  MG -- --  PHOS -- --   Liver Function Tests:  Lab 05/27/12 0648  AST 16  ALT 14  ALKPHOS 111  BILITOT 0.6  PROT 7.0  ALBUMIN 3.4*   CBC:  Lab 05/29/12 1327 05/27/12 0705  WBC 10.6* 10.5  NEUTROABS 8.1* 8.0*  HGB 16.7 17.0  HCT 47.0 47.5  MCV 97.1 97.9  PLT 199 200   CBG:  Lab 05/29/12 0717 05/28/12 2326 05/28/12 1710 05/28/12 1220 05/28/12 0635 05/27/12 2222  GLUCAP 124* 136* 96 153* 82 133*   Hemoglobin A1C:  Lab 05/28/12 0605  HGBA1C 5.3   Fasting Lipid Panel:  Lab 05/28/12 0500  CHOL 145  HDL 37*  LDLCALC 84  TRIG 119  CHOLHDL 3.9  LDLDIRECT --   Coagulation:  Lab 05/27/12 0705  LABPROT 12.5  INR 0.94   Drugs of Abuse     Component Value Date/Time   LABOPIA POSITIVE* 05/28/2012 0442   COCAINSCRNUR NONE DETECTED 05/28/2012 0442   LABBENZ POSITIVE* 05/28/2012 0442   AMPHETMU NONE  DETECTED 05/28/2012 [place] DETECTED 05/28/2012 [place] DETECTED 05/28/2012 0442    Misc. Labs: none Studies/Results: Mr Thoracic Spine W Wo Contrast  05/29/2012  *RADIOLOGY REPORT*  Clinical Data:  61 year old male with back pain, incontinence. Diabetes.  Lower extremity pain.  Contrast: 20 ml MultiHance.  MRI THORACIC SPINE WITHOUT AND WITH CONTRAST  Technique: Multiplanar and multiecho pulse sequences of the thoracic spine were obtained without and  with intravenous contrast.  Comparison: Brain MRI 05/27/2012.  CT abdomen and pelvis 07/20/2010 and earlier.  Findings:  Limited sagittal imaging of most of the cervical spine is within normal limits.  Thoracic vertebral height and alignment is within normal limits. No marrow edema or evidence of acute osseous abnormality.  No abnormal enhancement identified.  Spinal cord signal is within normal limits at all visualized levels.  Conus medullaris better depicted on lumbar images below.  Mild for age thoracic spine degeneration.  The only focal disc herniation identified is a small left paracentral disc protrusion at T10-T11.  No significant spinal stenosis results.  No foraminal stenosis identified.  Dependent atelectasis.  Chronic right adrenal adenoma partially visible.  IMPRESSION: 1.  Mild for age thoracic  spine degenerative changes with no spinal stenosis.  No acute findings identified in the thoracic spine. 2.  See lumbar findings below.  MRI LUMBAR SPINE WITHOUT AND WITH CONTRAST  Technique: Multiplanar and multiecho pulse sequences of the lumbar spine were obtained without and with intravenous contrast.  Findings:  Normal lumbar segmentation.  Trace retrolisthesis of L5 on S1, otherwise normal vertebral height and alignment.  Marrow signal within normal limits.  There is thin linear enhancement of the right S2 nerve root throughout the cauda equina (series 20 image 7, series 21 image 32).  No associated focal impingement of the  nerve root is identified, but this might be related to more generalized spinal stenosis as described below.  No abnormal enhancement identified.   Visualized lower thoracic spinal cord is normal with conus medularis at L1-L2.  Negative visualized abdominal viscera. Visualized paraspinal soft tissues are within normal limits.  T12-L1:  Negative.  L1-L2:  Negative.  L2-L3:  Mild disc desiccation and disc bulge.  Mild facet and ligament flavum hypertrophy.  Borderline to mild spinal stenosis.  L3-L4:  Mostly far lateral bilateral disc bulges and endplate spurring.  Mild to moderate facet and ligament flavum hypertrophy. Borderline to mild spinal stenosis.  Mild right greater than left L3 foraminal stenosis.  L4-L5:  Right eccentric circumferential disc osteophyte complex.  A small right foraminal annular tear.  Moderate facet and ligament flavum hypertrophy also greater on the right.  Borderline to mild spinal stenosis.  Mild right lateral recess stenosis.  Moderate right L4 foraminal stenosis.  L5-S1:  Disc space loss.  Suspect vacuum disc phenomena.  Left eccentric circumferential disc osteophyte complex.  Incidental left L5 inferior endplate benign vertebral hemangioma.  Mild facet and ligament flavum hypertrophy.  Mild left greater than right lateral recess stenosis.  No significant spinal stenosis.  Moderate left L5 foraminal stenosis.  IMPRESSION: 1. 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  Borderline to mild multifactorial spinal stenosis from L2-L3 to L4-L5. 2.  Multifactorial moderate right L4 and left L5 neural foraminal stenosis.  Mild lumbar foraminal stenosis elsewhere. 3.  Thin linear enhancement of the right S2 nerve root throughout the cauda equina, favor related to the above degenerative changes. No other abnormal enhancement.   Original Report Authenticated By: Roselyn Reef, M.D.    Mr Lumbar Spine W Wo Contrast  05/29/2012  *RADIOLOGY REPORT*  Clinical Data:  61 year old male with back  pain, incontinence. Diabetes.  Lower extremity pain.  Contrast: 20 ml MultiHance.  MRI THORACIC SPINE WITHOUT AND WITH CONTRAST  Technique: Multiplanar and multiecho pulse sequences of the thoracic spine were obtained without and  with intravenous contrast.  Comparison: Brain MRI 05/27/2012.  CT abdomen and pelvis 07/20/2010 and earlier.  Findings:  Limited sagittal imaging of most of the cervical spine is within normal limits.  Thoracic vertebral height and alignment is within normal limits. No marrow edema or evidence of acute osseous abnormality.  No abnormal enhancement identified.  Spinal cord signal is within normal limits at all visualized levels.  Conus medullaris better depicted on lumbar images below.  Mild for age thoracic spine degeneration.  The only focal disc herniation identified is a small left paracentral disc protrusion at T10-T11.  No significant spinal stenosis results.  No foraminal stenosis identified.  Dependent atelectasis.  Chronic right adrenal adenoma partially visible.  IMPRESSION: 1.  Mild for age thoracic spine degenerative changes with no spinal stenosis.  No acute findings identified in the thoracic spine. 2.  See lumbar findings below.  MRI LUMBAR SPINE WITHOUT AND WITH CONTRAST  Technique: Multiplanar and multiecho pulse sequences of the lumbar spine were obtained without and with intravenous contrast.  Findings:  Normal lumbar segmentation.  Trace retrolisthesis of L5 on S1, otherwise normal vertebral height and alignment.  Marrow signal within normal limits.  There is thin linear enhancement of the right S2 nerve root throughout the cauda equina (series 20 image 7, series 21 image 32).  No associated focal impingement of the nerve root is identified, but this might be related to more generalized spinal stenosis as described below.  No abnormal enhancement identified.   Visualized lower thoracic spinal cord is normal with conus medularis at L1-L2.  Negative visualized abdominal  viscera. Visualized paraspinal soft tissues are within normal limits.  T12-L1:  Negative.  L1-L2:  Negative.  L2-L3:  Mild disc desiccation and disc bulge.  Mild facet and ligament flavum hypertrophy.  Borderline to mild spinal stenosis.  L3-L4:  Mostly far lateral bilateral disc bulges and endplate spurring.  Mild to moderate facet and ligament flavum hypertrophy. Borderline to mild spinal stenosis.  Mild right greater than left L3 foraminal stenosis.  L4-L5:  Right eccentric circumferential disc osteophyte complex.  A small right foraminal annular tear.  Moderate facet and ligament flavum hypertrophy also greater on the right.  Borderline to mild spinal stenosis.  Mild right lateral recess stenosis.  Moderate right L4 foraminal stenosis.  L5-S1:  Disc space loss.  Suspect vacuum disc phenomena.  Left eccentric circumferential disc osteophyte complex.  Incidental left L5 inferior endplate benign vertebral hemangioma.  Mild facet and ligament flavum hypertrophy.  Mild left greater than right lateral recess stenosis.  No significant spinal stenosis.  Moderate left L5 foraminal stenosis.  IMPRESSION: 1. 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  Borderline to mild multifactorial spinal stenosis from L2-L3 to L4-L5. 2.  Multifactorial moderate right L4 and left L5 neural foraminal stenosis.  Mild lumbar foraminal stenosis elsewhere. 3.  Thin linear enhancement of the right S2 nerve root throughout the cauda equina, favor related to the above degenerative changes. No other abnormal enhancement.   Original Report Authenticated By: Roselyn Reef, M.D.    Medications:  Scheduled Meds:    . aspirin  300 mg Rectal Daily   Or  . aspirin  325 mg Oral Daily  . enoxaparin (LOVENOX) injection  40 mg Subcutaneous Q24H  . feeding supplement  237 mL Oral BID BM  . hydrochlorothiazide  12.5 mg Oral Daily  . metroNIDAZOLE  500 mg Oral Q8H  . nicotine  21 mg Transdermal Daily  . simvastatin  10 mg Oral q1800    Continuous Infusions:    . sodium chloride 100 mL/hr at 05/27/12 1330   PRN Meds:.acetaminophen, HYDROcodone-acetaminophen, ondansetron (ZOFRAN) IV Assessment/Plan: 1. Multifocal acute and subacute infarcts in left posterior limb internal capsule, anterior left external capsule with slight involvement of the left caudate head and anterior lentiform nucleus -With deficits  Decreased right shoulder shrug, right lower leg weakness >right upper arm weaknesss, RLE decreased sensation, slight right facial droop, finger to nose right not intact   Plan -Aspirin 325 mg qd  -CIR today for inpatient rehab -LDL 84 but Tx Zocor 10 qhs and transition to Pravastatin at d/c   2. HTN  -HCTZ 12.5 mg qd -Continue to monitor BP ranging 120s-140s/60s-80s   3. Right 4th toe laceration  -wound care RN consulted recommend to wrap the toe with Vaseline gauze  to  insulate and will not adhere to the wound bed, weave gauze between toes to hold in place.  4. C.difficile positive with diarrhea -Rx Flagyl 500 mg tid (12/26-Day 2) x 10-14 days total  5. Chronic pain (back, legs) -MRI with mild thoracic spine degenerative changes with no spinal stenosis. No acute findings. Lumbar with chronic lower lumbar disc facet degeneration.  Borderline to mild multifactorial spinal stenosis L2-L3 to L4-L5.  Mild lumbar foraminal stenosis elsewhere.    6. DM 2  -5.3 HA1C, SSI   7. Tobacco abuse  -smoking 2 ppd  -Rx Nicotine patch 21 mcg   8. DVT Px  -Lovenox   9. F/E/N  -NS 10-20 cc/hr  -will monitor labs (Hypokalemia today 2.9 suspect from GI losses from diarrhea, trend BMET, given Kdur 40 meQ x 3 doses  Today (spaced out by 4 hours), then 40 meQ bid x 1 days then stop -cardiac diet    Dispo: d/c to rehab (CIR)   The patient does have a current PCP (HASANAJ,XAJE A, MD), therefore will be requiring OPC follow-up after discharge.   The patient does not have transportation limitations that hinder transportation  to clinic appointments.  .Services Needed at time of discharge: Y = Yes, Blank = No PT: Y  OT: Y  RN:   Equipment: Rolling walker 5'', wheelchair and cushion, bedside commode   Other: CIR    LOS: 3 days   Cresenciano Genre 05/30/2012, 11:45 AM [phone]

## 2012-05-30 NOTE — Progress Notes (Signed)
Internal Medicine Teaching Service Attending Note Date: 05/30/2012  Patient name: Eric Scott  Medical record number: [id number]  Date of birth: 03-11-1951    This patient has been seen and discussed with the house staff. Please see their note for complete details. I concur with their findings with the following additions/corrections: Mr Eads was with PT when we entered. He has been accepted to CIR once the insurance is worked out. Vitals are stable. On tx for C diff. Needs further PT / OT.   Jourdain Guay 05/30/2012, 11:56 AM

## 2012-05-31 ENCOUNTER — Inpatient Hospital Stay (HOSPITAL_COMMUNITY): Payer: Medicare Other | Admitting: Occupational Therapy

## 2012-05-31 ENCOUNTER — Inpatient Hospital Stay (HOSPITAL_COMMUNITY): Payer: Medicare Other | Admitting: Speech Pathology

## 2012-05-31 ENCOUNTER — Inpatient Hospital Stay (HOSPITAL_COMMUNITY): Payer: Medicare Other

## 2012-05-31 DIAGNOSIS — A0472 Enterocolitis due to Clostridium difficile, not specified as recurrent: Secondary | ICD-10-CM

## 2012-05-31 DIAGNOSIS — F329 Major depressive disorder, single episode, unspecified: Secondary | ICD-10-CM

## 2012-05-31 DIAGNOSIS — I6330 Cerebral infarction due to thrombosis of unspecified cerebral artery: Secondary | ICD-10-CM

## 2012-05-31 LAB — GLUCOSE, CAPILLARY
Glucose-Capillary: 144 mg/dL — ABNORMAL HIGH (ref 70–99)
Glucose-Capillary: 165 mg/dL — ABNORMAL HIGH (ref 70–99)

## 2012-05-31 MED ORDER — GABAPENTIN 100 MG PO CAPS
100.0000 mg | ORAL_CAPSULE | Freq: Three times a day (TID) | ORAL | Status: DC
  Administered 2012-05-31 – 2012-06-16 (×49): 100 mg via ORAL
  Filled 2012-05-31 (×52): qty 1

## 2012-05-31 NOTE — Evaluation (Signed)
Speech Language Pathology Assessment and Plan  Patient Details  Name: Eric Scott MRN: [id number] Date of Birth: 11-Oct-1950  SLP Diagnosis: Dysarthria;Cognitive Impairments  Rehab Potential: Good ELOS: 2-3 weeks   Today's Date: 05/31/2012 Time: 0800-0925 Time Calculation (min): 85 min  Problem List:  Patient Active Problem List  Diagnosis  . Acute lacunar stroke  . DM type 2 (diabetes mellitus, type 2)  . Tobacco abuse  . HTN (hypertension)  . Chronic pain (back, legs)  . Toe laceration, 4th toe  . C. difficile diarrhea  . Hypokalemia   Past Medical History:  Past Medical History  Diagnosis Date  . Hypertension   . Hypercholesteremia   . Type II diabetes mellitus   . Diabetic peripheral neuropathy     "chronic" (05/27/2012)  . Stroke 05/2012    Subacute, lacunar infarcts within the left basal ganglia and posterior limp of the left internal capsule/thalamus; "RUE; both feet weak" (05/27/2012)  . Anxiety   . Spinal stenosis     mild lumbar (MRI 05/2012)-L2-L3 to L4-L5 , mild lumbar foraminal stenosis   . Chronic pain     legs, back; MRI 05/2012 with mild thoracic degenerative changes no spinal stenosis    Past Surgical History:  Past Surgical History  Procedure Date  . Hernia repair 01/05/2004    "belly button" (05/27/2012)    Assessment / Plan / Recommendation Clinical Impression  61 y.o. right-handed male admitted 05/27/2012 with right-sided weakness and altered mental status. 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  PMH includes borderline diabetes mellitus and tobacco abuse as well as hypertension.  Patient on no scheduled medications prior to admission.   MRI thoracic/ lumbar spine that showed no acute findings thoracic and chronic lower  lumbar disc and facet degeneration with borderline to mild multi-vac portal spinal stenosis from L2-3 to L4-5 as well as multi-factorial moderate right L4 and left L5 neural foraminal stenosis. No obvious cord compression.  ST on acute care venue assessed speech-cognition and recommended ST for dysarthia and cognitive deficits.  Swallow function was not assessed in acute care.  Pt. transferred to CIR 12/27 for intensive rehab to maximize independence.  Today's evaluation revealed continued, however improved dysarthria at conversation level as compared to initial assessment 12/24.  Mild cognitive impairments persist in the areas of awareness (emergent/anticipatory), attention, working memory and problem solving.  Thought processing and organization is delayed with delayed verbal responses in conversation (1 min at times).  He has no complaints with swallowing, however dysphagia risk is somewhat higher after basal ganglia CVA.  SLP willl assess swallow function in diagnostic treatment.  Pt. would benefit from skilled ST to facilitate cognitive-communicative abilities moving toward increased level of independence.      SLP Assessment  Patient will need skilled Speech Lanaguage Pathology Services during CIR admission    Recommendations  Diet Recommendations: Regular;Thin liquid Liquid Administration via: Cup;Straw Medication Administration: Whole meds with liquid Supervision: Patient able to self feed Compensations: Slow rate;Small sips/bites Postural Changes and/or Swallow Maneuvers: Seated upright 90 degrees Oral Care Recommendations: Oral care BID Patient destination: Home Follow up Recommendations: Other (comment) (to be determined) Equipment Recommended: None recommended by SLP    SLP Frequency 5 out of 7 days   SLP Treatment/Interventions Cognitive remediation/compensation;Environmental controls;Functional tasks;Internal/external aids;Patient/family education    Pain  Pain Assessment Pain  Assessment: No/denies pain Pain Score:   8 Pain Type: Chronic pain Pain Location:  (feet) Pain Orientation: Right;Left Pain Descriptors: Burning;Aching;Constant Pain Onset: On-going Pain Intervention(s):  (received pain meds recently) Prior Functioning Cognitive/Linguistic Baseline: Within functional limits (assume WFL's, no family present) Type of Home: Mobile home Lives With: Friend(s) Available Help at Discharge: Friend(s) 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 will improve speech intelligibility in challenging communicative environments utilizing strategies with min verbal/visual cues. SLP Short Term Goal 2 (Week 1): Pt. will demonstrate alternating attention in functional activities in moderately distracting environment with min verbal/visual cues. SLP Short Term Goal 3 (Week 1): Pt. will evaluate/self monitor ability/progress during various activities with moderate verbal cues. SLP Short Term Goal 4 (Week 1): Pt. will recall and utilize strategies to assist with working and prospective memory with mod verbal/visual cues.  See FIM for current functional status Refer to Care Plan for [place] Term Goals  Recommendations for other services: None  Discharge Criteria: Patient will be discharged from SLP if patient refuses treatment 3 consecutive times without medical reason, if treatment goals not met, if there is a change in medical status, if patient makes no progress towards goals or if patient is discharged from hospital.  The above assessment, treatment plan, treatment alternatives and goals were discussed and mutually agreed upon: by patient  Houston Siren 05/31/2012, 1:23 PM

## 2012-05-31 NOTE — Progress Notes (Addendum)
INITIAL NUTRITION ASSESSMENT  DOCUMENTATION CODES Per approved criteria  -Not Applicable   INTERVENTION:  Ensure Complete twice daily between meals (350 kcals, 13 gm protein per 8 fl oz bottle) RD to follow for nutrition care plan  NUTRITION DIAGNOSIS: Inadequate oral intake related to poor appetite as evidenced by patient report  Goal: Oral intake with meals & supplements to meet >/= 90% of estimated nutrition needs  Monitor:  PO & supplemental intake, weight, labs, I/O's  Reason for Assessment: Malnutrition Screening Tool Report  61 y.o. male  Admitting Dx: Functional deficits secondary to left PLIC into thalamus thrombotic infarct   ASSESSMENT: Patient admitted to [facility] 12/27 from 4N floor at [facility]; patient known to this RD from hospital admission; patient states his appetite is "not good;" does not know if he's recently lost weight; unable to quantify; PO intake 100% this AM; would like chocolate Ensure supplements -- orders in place.  Height: Ht Readings from Last 1 Encounters:  05/30/12 6' (1.829 m)    Weight: Wt Readings from Last 1 Encounters:  05/30/12 209 lb 3.5 oz (94.9 kg)    Ideal Body Weight: 81 kg  % Ideal Body Weight:   Wt Readings from Last 10 Encounters:  05/30/12 209 lb 3.5 oz (94.9 kg)    Usual Body Weight: unknown  % Usual Body Weight: ---  BMI:  Body mass index is 28.37 kg/(m^2).  Estimated Nutritional Needs: Kcal: 2000-2200 Protein: 100-110 gm Fluid: 2.0-2.2 L  Skin: laceration of the right 4th toe  Diet Order: Cardiac  EDUCATION NEEDS: -No education needs identified at this time   Intake/Output Summary (Last 24 hours) at 05/31/12 1014 Last data filed at 05/31/12 0700  Gross per 24 hour  Intake      0 ml  Output    300 ml  Net   -300 ml    Labs:   Lab 05/30/12 0705 05/28/12 0835 05/27/12 0648  NA 141 143 141  K 2.9* 3.6 3.3*  CL 100 106 103  CO2 25 26 26   BUN 13 9 9   CREATININE 1.03 0.83 0.85  CALCIUM 9.1  8.6 9.1  MG -- -- --  PHOS -- -- --  GLUCOSE 197* 148* 140*    CBG (last 3)   Basename 05/31/12 0732 05/30/12 2118 05/30/12 1628  GLUCAP 144* 165* 193*    Scheduled Meds:   . aspirin  300 mg Rectal Daily   Or  . aspirin  325 mg Oral Daily  . enoxaparin (LOVENOX) injection  40 mg Subcutaneous Q24H  . feeding supplement  237 mL Oral BID BM  . gabapentin  100 mg Oral TID  . hydrochlorothiazide  12.5 mg Oral Daily  . insulin aspart  0-15 Units Subcutaneous TID WC  . insulin aspart  0-5 Units Subcutaneous QHS  . metroNIDAZOLE  500 mg Oral Q8H  . nicotine  21 mg Transdermal Daily  . simvastatin  10 mg Oral q1800    Continuous Infusions:   Past Medical History  Diagnosis Date  . Hypertension   . Hypercholesteremia   . Type II diabetes mellitus   . Diabetic peripheral neuropathy     "chronic" (05/27/2012)  . Stroke 05/2012    Subacute, lacunar infarcts within the left basal ganglia and posterior limp of the left internal capsule/thalamus; "RUE; both feet weak" (05/27/2012)  . Anxiety   . Spinal stenosis     mild lumbar (MRI 05/2012)-L2-L3 to L4-L5 , mild lumbar foraminal stenosis   .  Chronic pain     legs, back; MRI 05/2012 with mild thoracic degenerative changes no spinal stenosis     Past Surgical History  Procedure Date  . Hernia repair 01/05/2004    "belly button" (05/27/2012)    Phillips Odor, RD, [place] Pager #: [phone] After-Hours Pager #: [phone]

## 2012-05-31 NOTE — Plan of Care (Signed)
Problem: RH KNOWLEDGE DEFICIT Goal: RH STG INCREASE KNOWLEDGE OF DIABETES Outcome: Progressing Pt. Continues to need reinforcements regarding risk of stroke r/t management of Dbts, HTN, and smoking cessation.  Handouts given.

## 2012-05-31 NOTE — Evaluation (Signed)
Occupational Therapy Assessment and Plan  Patient Details  Name: Eric Scott MRN: [id number] Date of Birth: 07-24-1950  OT Diagnosis: abnormal posture, acute pain, cognitive deficits, disturbance of vision, hemiplegia affecting dominant side, lumbago (low back pain), muscle weakness (generalized) and swelling of limb Rehab Potential: Rehab Potential: Good ELOS: 10 days-2 weeks   Today's Date: 05/31/2012 Time: 0800-0925 Time Calculation (min): 85 min  Problem List:  Patient Active Problem List  Diagnosis  . Acute lacunar stroke  . DM type 2 (diabetes mellitus, type 2)  . Tobacco abuse  . HTN (hypertension)  . Chronic pain (back, legs)  . Toe laceration, 4th toe  . C. difficile diarrhea  . Hypokalemia    Past Medical History:  Past Medical History  Diagnosis Date  . Hypertension   . Hypercholesteremia   . Type II diabetes mellitus   . Diabetic peripheral neuropathy     "chronic" (05/27/2012)  . Stroke 05/2012    Subacute, lacunar infarcts within the left basal ganglia and posterior limp of the left internal capsule/thalamus; "RUE; both feet weak" (05/27/2012)  . Anxiety   . Spinal stenosis     mild lumbar (MRI 05/2012)-L2-L3 to L4-L5 , mild lumbar foraminal stenosis   . Chronic pain     legs, back; MRI 05/2012 with mild thoracic degenerative changes no spinal stenosis    Past Surgical History:  Past Surgical History  Procedure Date  . Hernia repair 01/05/2004    "belly button" (05/27/2012)    Assessment & Plan Clinical Impression: Patient is a 61 y.o. year old male 05/27/2012 with right-sided weakness and altered mental status. 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 MRA of the head with moderate small vessel disease moderate proximal stenosis  of the right P1 segment without any other significant proximal stenosis or aneurysm. Echocardiogram with ejection fraction of 60% and normal systolic function. Carotid Dopplers with no ICA stenosis. Maintained on aspirin therapy for stroke prophylaxis as well as subcutaneous Lovenox for DVT prophylaxis. A NicoDerm patch was introduced for history of tobacco abuse. 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 There was no obvious cord compression.. Clostridium difficile specimen obtained 05/29/2012 that was positive and patient initiated on Flagyl.  Patient transferred to CIR on 05/30/2012 .    Patient currently requires max with basic self-care skills secondary to pain,  muscle weakness, abnormal tone, unbalanced muscle activation, ataxia and decreased coordination and decreased initiation, decreased attention, decreased awareness, decreased problem solving, decreased safety awareness, decreased memory and delayed processing.  Prior to hospitalization, patient could complete basic self care skills with min.  Patient will benefit from skilled intervention to decrease level of assist with basic self-care skills prior to discharge home with care partner.  Anticipate patient will require minimal physical assistance and follow up home health, unless SNF is necessary, OT follow up in SNF.  OT - End of Session Activity Tolerance: Tolerates 10 - 20 min activity with multiple  rests Endurance Deficit: Yes OT Assessment Rehab Potential: Good Barriers to Discharge: Decreased caregiver support OT Plan OT Intensity:  Minimum of 1-2 x/day, 45 to 90 minutes OT Frequency: 5 out of 7 days OT Duration/Estimated Length of Stay: 10 days-2 weeks OT Treatment/Interventions: Balance/vestibular training;Cognitive remediation/compensation;Disease mangement/prevention;Discharge planning;DME/adaptive equipment instruction;Functional mobility training;Neuromuscular re-education;Pain management;Patient/family education;Psychosocial support;Self Care/advanced ADL retraining;Skin care/wound managment;Therapeutic Activities;Therapeutic Exercise;UE/LE Strength taining/ROM;UE/LE Coordination activities;Visual/perceptual remediation/compensation OT Recommendation Recommendations for Other Services: Neuropsych consult Patient destination: [place] (SNF) (if limited caregiver support) Follow Up Recommendations: Skilled nursing facility  OT Evaluation Precautions/Restrictions  Precautions Precautions: Fall Precaution Comments: contact- C. diff Restrictions Weight Bearing Restrictions: No   Pain:  8/10 feet /low back RN brought pain medicineMotor Motor: Hemiplegia;Motor impersistence;Abnormal postural alignment and control (motor impersistence bil LEs) Motor - Skilled Clinical Observations: falls to right in sitting, little ability to spontaneously correct RUE Assessment RUE Assessment: Exceptions to [facility] (hemiplegia, more impaired proximally) See FIM for current functional status See OT navigator for evaluation results  Refer to Care Plan for [place] Term Goals  Recommendations for other services: None  Discharge Criteria: Patient will be discharged from OT if patient refuses treatment 3 consecutive times without medical reason, if treatment goals not met, if there is a change in medical status, if patient makes no progress towards goals or if patient is discharged from hospital.  The above assessment, treatment plan, treatment alternatives and goals were discussed and mutually agreed upon: by  patient  Mariah Milling 05/31/2012, 12:30 PM

## 2012-05-31 NOTE — Evaluation (Signed)
Physical Therapy Assessment and Plan  Patient Details  Name: Eric Scott MRN: [id number] Date of Birth: [birth year]  PT Diagnosis: Difficulty walking, Hemiparesis dominant, Impaired cognition and Impaired sensation Rehab Potential: Good ELOS: 2-2.5 weeks   Today's Date: 05/31/2012 Time: 1105-1205 Time Calculation (min): 60 min  Problem List:  Patient Active Problem List  Diagnosis  . Acute lacunar stroke  . DM type 2 (diabetes mellitus, type 2)  . Tobacco abuse  . HTN (hypertension)  . Chronic pain (back, legs)  . Toe laceration, 4th toe  . C. difficile diarrhea  . Hypokalemia    Past Medical History:  Past Medical History  Diagnosis Date  . Hypertension   . Hypercholesteremia   . Type II diabetes mellitus   . Diabetic peripheral neuropathy     "chronic" (05/27/2012)  . Stroke 05/2012    Subacute, lacunar infarcts within the left basal ganglia and posterior limp of the left internal capsule/thalamus; "RUE; both feet weak" (05/27/2012)  . Anxiety   . Spinal stenosis     mild lumbar (MRI 05/2012)-L2-L3 to L4-L5 , mild lumbar foraminal stenosis   . Chronic pain     legs, back; MRI 05/2012 with mild thoracic degenerative changes no spinal stenosis    Past Surgical History:  Past Surgical History  Procedure Date  . Hernia repair 01/05/2004    "belly button" (05/27/2012)    Assessment & Plan Clinical Impression: Eric Scott is a 61 y.o. right-handed male with history of borderline diabetes mellitus and tobacco abuse as well as hypertension. Patient on no scheduled medications prior to admission. Admitted 05/27/2012 with right-sided weakness and altered mental status. 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 MRA of the head with moderate small  vessel disease moderate proximal stenosis of the right P1 segment without any other significant proximal stenosis or aneurysm. Echocardiogram with ejection fraction of 60% and normal systolic function. Carotid Dopplers with no ICA stenosis. Maintained on aspirin therapy for stroke prophylaxis as well as subcutaneous Lovenox for DVT prophylaxis. A NicoDerm patch was introduced for history of tobacco abuse. 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 There was no obvious cord compression.. Clostridium difficile specimen obtained 05/29/2012 that was positive.  Patient transferred to CIR on 05/30/2012 .   Patient currently requires +2 assist with mobility secondary to muscle weakness and muscle joint tightness, impaired timing and sequencing, unbalanced muscle activation, decreased coordination and decreased motor planning and decreased initiation, decreased awareness, decreased problem solving, decreased memory and delayed processing.  Prior to hospitalization, patient was independent with mobility and lived with Friend(s) in a Mobile home home.  Home access is  Stairs to enter;Ramped entrance.  Patient will benefit from skilled PT intervention to maximize safe functional mobility, minimize fall risk and decrease caregiver burden for planned discharge home with 24 hour assist.  At eval, it is unknown if pt's friends will be able to provide 24 hour supervision  Anticipate patient  will benefit from follow up [place] at discharge.  PT - End of Session Activity Tolerance: Tolerates 10 - 20 min activity with multiple  rests Endurance Deficit: Yes PT Assessment Rehab Potential: Good Barriers to Discharge: Decreased caregiver support PT Plan PT Intensity: Minimum of 1-2 x/day ,45 to 90 minutes PT Frequency: 5 out of 7 days PT Duration Estimated Length of Stay: 2-2.5 weeks PT Treatment/Interventions: Ambulation/gait training;Balance/vestibular training;Cognitive remediation/compensation;Discharge planning;DME/adaptive equipment instruction;Functional electrical stimulation;Functional mobility training;Neuromuscular re-education;Patient/family education;Stair training;Splinting/orthotics;Psychosocial support;Therapeutic Activities;Therapeutic Exercise;UE/LE Strength taining/ROM;UE/LE Coordination activities;Wheelchair propulsion/positioning PT Recommendation Follow Up Recommendations: Home health PT Patient destination: Home Equipment Recommended: Wheelchair (measurements);Wheelchair cushion (measurements);Rolling walker with 5" wheels  PT Evaluation Precautions/Restrictions Precautions Precautions: Fall Precaution Comments: contact- C. diff Restrictions Weight Bearing Restrictions: No General   Vital SignsTherapy Vitals Pulse Rate: 95 BP: 127/79 mmHg Patient Position, if appropriate: Sitting Oxygen Therapy SpO2: 94 % O2 Device: None (Room air) Pain Denies pain   Home Living/Prior Functioning Home Living Lives With: Friend(s) Available Help at Discharge: Friend(s) Type of Home: Mobile home Home Access: Stairs to enter;Ramped entrance Home Layout: One level Bathroom Shower/Tub: Product/process development scientist: Standard Home Adaptive Equipment: Straight cane Prior Function Able to Take Stairs?: Yes Driving: Yes Vocation: On disability (pt unable to recall why he was on disability) Vision/Perception  Vision - History Baseline Vision: Wears glasses all the time Vision - Assessment Eye Alignment: Impaired (comment) Perception Perception: Within Functional Limits   Cognition Overall Cognitive Status: Impaired Arousal/Alertness: Awake/alert Orientation Level: Oriented X4 Attention: Sustained Sustained Attention: Impaired Sustained Attention Impairment: Functional basic Memory: Impaired Memory Impairment: Retrieval deficit;Storage deficit Decreased Short Term Memory: Functional basic Awareness: Impaired Problem Solving: Impaired Problem Solving Impairment: Functional basic Sensation Sensation Light Touch: Impaired by gross assessment (bil LEs) Proprioception: Appears Intact (bil knees and ankle) Coordination Gross Motor Movements are Fluid and Coordinated: No Heel Shin Test: bil decreasaed speed, accuracy, excursion Motor  Motor Motor: Hemiplegia;Motor impersistence;Abnormal postural alignment and control (motor impersistence bil LEs) Motor - Skilled Clinical Observations: leans heavity to R in standing at times  Mobility Transfers Sit to Stand: 3: Mod assist Stand to Sit: 3: Mod assist Stand Pivot Transfers: 2: Max assist Locomotion  Ambulation Ambulation: Yes Ambulation/Gait Assistance: 1: +2 Total assist Ambulation Distance (Feet): 8 Feet Gait Gait: Yes Gait Pattern: Impaired Gait Pattern: Step-to pattern;Lateral trunk lean to right;Decreased hip/knee flexion - right;Decreased dorsiflexion - right Gait velocity: reduced Stairs / Additional Locomotion Stairs: Yes Stairs Assistance: 3: Mod assist Stairs Assistance Details: Verbal cues for gait pattern;Manual facilitation for weight shifting Stair Management Technique: Two rails Number of Stairs: 5  Height of Stairs: 7  Wheelchair Mobility Wheelchair Mobility: Yes Wheelchair Assistance: 5: Investment banker, operational Details: Verbal cues for Information systems manager: Both upper extremities Wheelchair Parts Management: Needs assistance Distance: 160  Trunk/Postural Assessment  Thoracic Assessment Thoracic Assessment: Exceptions to [facility] (thoracic flexion  with lateral flexion to right in sitting) Lumbar Assessment Lumbar Assessment: Exceptions to [facility] (posterior pelvic tilt) Postural Control Postural Control: Deficits on evaluation (falls / lists to right without protective responses)  Balance Static Sitting Balance Static Sitting - Balance Support: Feet supported;No upper extremity supported Static Sitting - Level of Assistance: 5: Stand by assistance Static Sitting - Comment/# of Minutes: 5 Dynamic Sitting Balance Dynamic Sitting - Level of Assistance: 2: Max assist Extremity Assessment      RLE Assessment RLE Assessment: Exceptions to [facility] (hamstrings and heel cord tight) RLE Strength RLE Overall Strength Comments: grossly 2-/5 hip flexion; 3-/5  knee extension; ankle DF 4-/5 LLE Assessment LLE Assessment: Exceptions to [facility] (hamstring and heel cord tight) LLE Strength LLE Overall Strength Comments: grossly 4-/5 hip; 4/5 knee and ankle  FIM:  FIM - Bed/Chair Transfer Bed/Chair Transfer: 2: Bed > Chair or W/C: Max A (lift and lower assist);2: Chair or W/C > Bed: Max A (lift and lower assist) FIM - Locomotion: Wheelchair Distance: 160 Locomotion: Wheelchair: 5: Travels 150 ft or more: maneuvers on rugs and over door sills with supervision, cueing or coaxing FIM - Locomotion: Ambulation Ambulation/Gait Assistance: 1: +2 Total assist Locomotion: Ambulation: 1: Two helpers FIM - Locomotion: Stairs Locomotion: Scientist, physiological: Insurance account manager - 2 Locomotion: Stairs: 2: Up and Down 4 - 11 stairs with maximal assistance (Pt: 25 - 49%) (leaned to R on last step)   Refer to Care Plan for [place] Term Goals  Recommendations for other services: None  Discharge Criteria: Patient will be discharged from PT if patient refuses treatment 3 consecutive times without medical reason, if treatment goals not met, if there is a change in medical status, if patient makes no progress towards goals or if patient is discharged from hospital.  The above  assessment, treatment plan, treatment alternatives and goals were discussed and mutually agreed upon: by patient  Treatment today:  W/c propulsion x 100' for general activity tolerance, supervision for route finding and safety. W/c parts management with hand over hand assistance.   Reyn Faivre 05/31/2012, 3:58 PM

## 2012-05-31 NOTE — Plan of Care (Signed)
Overall Plan of Care [facility]) Patient Details Name: Eric Scott MRN: [id number] DOB: [birth year]  Diagnosis:  Left PLIC into thalamus thrombotic infarct  Primary Diagnosis:    <principal problem not specified> Co-morbidities: HTN, DM with DPN, anxiety/depression  Functional Problem List  Patient demonstrates impairments in the following areas: Balance, Bladder, Bowel, Cognition, Edema, Endurance, Medication Management, Motor, Nutrition, Pain, Safety and Sensory   Basic ADL's: eating, grooming, bathing, dressing and toileting Advanced ADL's: simple meal preparation  Transfers:  bed mobility, bed to chair, toilet, tub/shower, car and furniture Locomotion:  ambulation, wheelchair mobility and stairs  Additional Impairments:  Functional use of upper extremity and Social Cognition   social interaction, problem solving, memory, attention and awareness  Anticipated Outcomes Item Anticipated Outcome  Eating/Swallowing  Min assist  Basic self-care  Min assist  Tolieting   Min assist  Bowel/Bladder   No incontinence issues;  min assist with urinal  Transfers  Min assist toilet and tub Supervision basic; min assist car  Locomotion  Min assist gait x 150' and up/down 5 steps Supervision w/c x 150'  Communication  Mod I  Cognition  Mod I  Pain  Chronic back and feet pain will be managed at equal or less than "3" with medication  Safety/Judgment  Min assist; bed alarm for safety 24 hour supervision   Other     Therapy Plan: PT Intensity: Minimum of 1-2 x/day ,45 to 90 minutes PT Frequency: 5 out of 7 days PT Duration Estimated Length of Stay: 2-2.5 weeks OT Intensity: Minimum of 1-2 x/day, 45 to 90 minutes OT Frequency: 5 out of 7 days OT Duration/Estimated Length of Stay: 10 days-2 weeks SLP Intensity: Minumum of 1-2 x/day, 30 to 90 minutes SLP Frequency: 5 out of 7 days SLP Duration/Estimated Length of Stay: 2-3 weeks    Team Interventions: Item RN PT OT SLP SW TR  Other  Self Care/Advanced ADL Retraining   x      Neuromuscular Re-Education  x x      Therapeutic Activities  x x      UE/LE Strength Training/ROM  x x      UE/LE Coordination Activities  x x      Visual/Perceptual Remediation/Compensation  x x      DME/Adaptive Equipment Instruction  x x      Therapeutic Exercise  x x      Balance/Vestibular Training  x x      Patient/Family Education  x x x     Cognitive Remediation/Compensation  x x x     Functional Mobility Training  x x      Ambulation/Gait Training  x       Stair Training  x       Wheelchair Propulsion/Positioning  x       Functional Customer service manager Facilitation    x     Bladder Management   x      Bowel Management   x      Disease Management/Prevention   x      Pain Management   x      Medication Management         Skin Care/Wound Management         Splinting/Orthotics  x       Discharge Planning  x x  Psychosocial Support  x x                             Team Discharge Planning: Destination: PT-Home ,OT-   , SLP- home Projected Follow-up: PT- , OT-   , SLP- TBD Projected Equipment Needs: PT-Wheelchair (measurements);Wheelchair cushion (measurements);Rolling walker with 5" wheels, OT-  , SLP-  Patient/family involved in discharge planning: PT- Patient,  OT- , SLP- Pt.  MD ELOS: 2 to 2.5 weeks Medical Rehab Prognosis:  Good Assessment: The patient has been admitted for CIR therapies. The team will be addressing Lower extremity strength, range of motion, stamina, balance, functional mobility, safety, adaptive techniques and equipment, ADL's, cognition, swallowing, communication, language, NMR, education for patient and family. Goals are set at a minimal assistance level.    See Team Conference Notes for weekly updates to the plan of care

## 2012-05-31 NOTE — Progress Notes (Signed)
Subjective/Complaints: Upset about being incontinent of bladder. Feet hurt. Diarrhea decreasing. Was able to sleep last night. A 12 point review of systems has been performed and if not noted above is otherwise negative.   Objective: Vital Signs: Blood pressure 153/94, pulse 79, temperature 97.9 F (36.6 C), temperature source Oral, resp. rate 16, height 6' (1.829 m), weight 94.9 kg (209 lb 3.5 oz), SpO2 97.00%. Mr Thoracic Spine W Wo Contrast  05/29/2012  *RADIOLOGY REPORT*  Clinical Data:  61 year old male with back pain, incontinence. Diabetes.  Lower extremity pain.  Contrast: 20 ml MultiHance.  MRI THORACIC SPINE WITHOUT AND WITH CONTRAST  Technique: Multiplanar and multiecho pulse sequences of the thoracic spine were obtained without and  with intravenous contrast.  Comparison: Brain MRI 05/27/2012.  CT abdomen and pelvis 07/20/2010 and earlier.  Findings:  Limited sagittal imaging of most of the cervical spine is within normal limits.  Thoracic vertebral height and alignment is within normal limits. No marrow edema or evidence of acute osseous abnormality.  No abnormal enhancement identified.  Spinal cord signal is within normal limits at all visualized levels.  Conus medullaris better depicted on lumbar images below.  Mild for age thoracic spine degeneration.  The only focal disc herniation identified is a small left paracentral disc protrusion at T10-T11.  No significant spinal stenosis results.  No foraminal stenosis identified.  Dependent atelectasis.  Chronic right adrenal adenoma partially visible.  IMPRESSION: 1.  Mild for age thoracic spine degenerative changes with no spinal stenosis.  No acute findings identified in the thoracic spine. 2.  See lumbar findings below.  MRI LUMBAR SPINE WITHOUT AND WITH CONTRAST  Technique: Multiplanar and multiecho pulse sequences of the lumbar spine were obtained without and with intravenous contrast.  Findings:  Normal lumbar segmentation.  Trace  retrolisthesis of L5 on S1, otherwise normal vertebral height and alignment.  Marrow signal within normal limits.  There is thin linear enhancement of the right S2 nerve root throughout the cauda equina (series 20 image 7, series 21 image 32).  No associated focal impingement of the nerve root is identified, but this might be related to more generalized spinal stenosis as described below.  No abnormal enhancement identified.   Visualized lower thoracic spinal cord is normal with conus medularis at L1-L2.  Negative visualized abdominal viscera. Visualized paraspinal soft tissues are within normal limits.  T12-L1:  Negative.  L1-L2:  Negative.  L2-L3:  Mild disc desiccation and disc bulge.  Mild facet and ligament flavum hypertrophy.  Borderline to mild spinal stenosis.  L3-L4:  Mostly far lateral bilateral disc bulges and endplate spurring.  Mild to moderate facet and ligament flavum hypertrophy. Borderline to mild spinal stenosis.  Mild right greater than left L3 foraminal stenosis.  L4-L5:  Right eccentric circumferential disc osteophyte complex.  A small right foraminal annular tear.  Moderate facet and ligament flavum hypertrophy also greater on the right.  Borderline to mild spinal stenosis.  Mild right lateral recess stenosis.  Moderate right L4 foraminal stenosis.  L5-S1:  Disc space loss.  Suspect vacuum disc phenomena.  Left eccentric circumferential disc osteophyte complex.  Incidental left L5 inferior endplate benign vertebral hemangioma.  Mild facet and ligament flavum hypertrophy.  Mild left greater than right lateral recess stenosis.  No significant spinal stenosis.  Moderate left L5 foraminal stenosis.  IMPRESSION: 1. 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  Borderline to mild multifactorial spinal stenosis from L2-L3 to L4-L5. 2.  Multifactorial moderate right L4 and left  L5 neural foraminal stenosis.  Mild lumbar foraminal stenosis elsewhere. 3.  Thin linear enhancement of the right S2  nerve root throughout the cauda equina, favor related to the above degenerative changes. No other abnormal enhancement.   Original Report Authenticated By: Roselyn Reef, M.D.    Mr Lumbar Spine W Wo Contrast  05/29/2012  *RADIOLOGY REPORT*  Clinical Data:  61 year old male with back pain, incontinence. Diabetes.  Lower extremity pain.  Contrast: 20 ml MultiHance.  MRI THORACIC SPINE WITHOUT AND WITH CONTRAST  Technique: Multiplanar and multiecho pulse sequences of the thoracic spine were obtained without and  with intravenous contrast.  Comparison: Brain MRI 05/27/2012.  CT abdomen and pelvis 07/20/2010 and earlier.  Findings:  Limited sagittal imaging of most of the cervical spine is within normal limits.  Thoracic vertebral height and alignment is within normal limits. No marrow edema or evidence of acute osseous abnormality.  No abnormal enhancement identified.  Spinal cord signal is within normal limits at all visualized levels.  Conus medullaris better depicted on lumbar images below.  Mild for age thoracic spine degeneration.  The only focal disc herniation identified is a small left paracentral disc protrusion at T10-T11.  No significant spinal stenosis results.  No foraminal stenosis identified.  Dependent atelectasis.  Chronic right adrenal adenoma partially visible.  IMPRESSION: 1.  Mild for age thoracic spine degenerative changes with no spinal stenosis.  No acute findings identified in the thoracic spine. 2.  See lumbar findings below.  MRI LUMBAR SPINE WITHOUT AND WITH CONTRAST  Technique: Multiplanar and multiecho pulse sequences of the lumbar spine were obtained without and with intravenous contrast.  Findings:  Normal lumbar segmentation.  Trace retrolisthesis of L5 on S1, otherwise normal vertebral height and alignment.  Marrow signal within normal limits.  There is thin linear enhancement of the right S2 nerve root throughout the cauda equina (series 20 image 7, series 21 image 32).  No  associated focal impingement of the nerve root is identified, but this might be related to more generalized spinal stenosis as described below.  No abnormal enhancement identified.   Visualized lower thoracic spinal cord is normal with conus medularis at L1-L2.  Negative visualized abdominal viscera. Visualized paraspinal soft tissues are within normal limits.  T12-L1:  Negative.  L1-L2:  Negative.  L2-L3:  Mild disc desiccation and disc bulge.  Mild facet and ligament flavum hypertrophy.  Borderline to mild spinal stenosis.  L3-L4:  Mostly far lateral bilateral disc bulges and endplate spurring.  Mild to moderate facet and ligament flavum hypertrophy. Borderline to mild spinal stenosis.  Mild right greater than left L3 foraminal stenosis.  L4-L5:  Right eccentric circumferential disc osteophyte complex.  A small right foraminal annular tear.  Moderate facet and ligament flavum hypertrophy also greater on the right.  Borderline to mild spinal stenosis.  Mild right lateral recess stenosis.  Moderate right L4 foraminal stenosis.  L5-S1:  Disc space loss.  Suspect vacuum disc phenomena.  Left eccentric circumferential disc osteophyte complex.  Incidental left L5 inferior endplate benign vertebral hemangioma.  Mild facet and ligament flavum hypertrophy.  Mild left greater than right lateral recess stenosis.  No significant spinal stenosis.  Moderate left L5 foraminal stenosis.  IMPRESSION: 1. 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  Borderline to mild multifactorial spinal stenosis from L2-L3 to L4-L5. 2.  Multifactorial moderate right L4 and left L5 neural foraminal stenosis.  Mild lumbar foraminal stenosis elsewhere. 3.  Thin linear enhancement of the right  S2 nerve root throughout the cauda equina, favor related to the above degenerative changes. No other abnormal enhancement.   Original Report Authenticated By: Roselyn Reef, M.D.     Basename 05/29/12 1327  WBC 10.6*  HGB 16.7  HCT 47.0  PLT 199     Basename 05/30/12 0705 05/28/12 0835  NA 141 143  K 2.9* 3.6  CL 100 106  GLUCOSE 197* 148*  BUN 13 9  CREATININE 1.03 0.83  CALCIUM 9.1 8.6   CBG (last 3)   Basename 05/30/12 2118 05/30/12 1628 05/29/12 0717  GLUCAP 165* 193* 124*    Wt Readings from Last 3 Encounters:  05/30/12 94.9 kg (209 lb 3.5 oz)    Physical Exam:  HENT:  Head: Normocephalic. Oral mucosa pink and moist  Eyes:  Pupils round and reactive to light  Neck: Neck supple. No thyromegaly present. No jvd  Cardiovascular: Normal rate and regular rhythm. No murmur  Pulmonary/Chest: Effort normal and breath sounds normal. He has no wheezes. No distress  Abdominal: Soft. Bowel sounds are normal. There is no tenderness.  Musculoskeletal: He exhibits no edema. Back exhibits tenderness to rom and palpation in the lumbar area  Neurological: He is alert.  Patient is a poor historian. He was able to state his appropriate age and date of birth. He could not name the hospital or situation. He did follow simple commands. Very distracted. Affect flat. RUE is 2+ to 3-/5 . RLE is 1+ prox to 3- distally. Probably stocking glove sensory loss in both legs also.Sensation 1/2 Right arm,leg. Did withdraw to pain. Toes non reactive. Infrequently makes eye contact. Right facial droop. Speech slurred.  Psych: affect extremely flat. Appears depressed.  Skin: Skin is warm and dry.    Assessment/Plan: 1. Functional deficits secondary to left PLIC into thalamus thrombotic infarct which require 3+ hours per day of interdisciplinary therapy in a comprehensive inpatient rehab setting. Physiatrist is providing close team supervision and 24 hour management of active medical problems listed below. Physiatrist and rehab team continue to assess barriers to discharge/monitor patient progress toward functional and medical goals. FIM:                   Comprehension Comprehension Mode: Auditory Comprehension: 5-Follows basic  conversation/direction: With extra time/assistive device  Expression Expression Mode: Verbal Expression: 5-Expresses basic needs/ideas: With extra time/assistive device  Social Interaction Social Interaction: 5-Interacts appropriately 90% of the time - Needs monitoring or encouragement for participation or interaction.  Problem Solving Problem Solving: 5-Solves basic 90% of the time/requires cueing < 10% of the time  Memory Memory: 4-Recognizes or recalls 75 - 89% of the time/requires cueing 10 - 24% of the time  Medical Problem List and Plan:  1. Left thrombotic PLIC. infarct  2. DVT Prophylaxis/Anticoagulation: Subcutaneous Lovenox. Monitor platelet counts any signs of bleeding  3. Pain Management: Hydrocodone as needed. Monitor with increased mobility   -add neurontin for neuropathic pain (home med) 4. Neuropsych: This patient is not capable of making decisions on his/her own behalf.   -team to provide egosupport. Depression eval 5. Clostridium difficile. Flagyl 500 mg every 8 hours initiated 05/29/2012  -diarrhea already slowing down 6. Hypertension. Hydrochlorothiazide 12.5 mg daily. Monitor the increased mobility  7. Borderline diabetes mellitus. Latest hemoglobin A1c 5.3. Accu-Cheks have been discontinued. But sugars been within acceptable limits generally 8. Tobacco abuse. NicoDerm patch. Provide counseling.  9. Hyperlipidemia. Zocor  LOS (Days) 1 A FACE TO FACE EVALUATION WAS PERFORMED  SWARTZ,ZACHARY  T 05/31/2012 6:40 AM

## 2012-06-01 ENCOUNTER — Inpatient Hospital Stay (HOSPITAL_COMMUNITY): Payer: Medicare Other | Admitting: Occupational Therapy

## 2012-06-01 ENCOUNTER — Inpatient Hospital Stay (HOSPITAL_COMMUNITY): Payer: Medicare Other

## 2012-06-01 ENCOUNTER — Inpatient Hospital Stay (HOSPITAL_COMMUNITY): Payer: Medicare Other | Admitting: Physical Therapy

## 2012-06-01 DIAGNOSIS — I639 Cerebral infarction, unspecified: Secondary | ICD-10-CM

## 2012-06-01 LAB — BASIC METABOLIC PANEL
BUN: 31 mg/dL — ABNORMAL HIGH (ref 6–23)
Chloride: 101 mEq/L (ref 96–112)
GFR calc Af Amer: 73 mL/min — ABNORMAL LOW (ref >90)
GFR calc non Af Amer: 63 mL/min — ABNORMAL LOW (ref >90)
Potassium: 4.5 mEq/L (ref 3.5–5.1)
Sodium: 141 mEq/L (ref 135–145)

## 2012-06-01 LAB — GLUCOSE, CAPILLARY
Glucose-Capillary: 225 mg/dL — ABNORMAL HIGH (ref 70–99)
Glucose-Capillary: 214 mg/dL — ABNORMAL HIGH (ref 70–99)
Glucose-Capillary: 196 mg/dL — ABNORMAL HIGH (ref 70–99)

## 2012-06-01 MED ORDER — POTASSIUM CHLORIDE CRYS ER 20 MEQ PO TBCR
20.0000 meq | EXTENDED_RELEASE_TABLET | Freq: Two times a day (BID) | ORAL | Status: DC
  Administered 2012-06-01 – 2012-06-02 (×4): 20 meq via ORAL
  Filled 2012-06-01 (×8): qty 1

## 2012-06-01 NOTE — Progress Notes (Signed)
Occupational Therapy Session Note  Patient Details  Name: Eric Scott MRN: [id number] Date of Birth: May 08, 1951  Today's Date: 06/01/2012 Time: 1345-1430 nand [id number] Time Calculation (min): 45 min and 45 min=90 total min  Skilled Therapeutic Interventions/Progress Updates:   AM session:  ADL supine in bed for LB and in w/c at sink for UB.  Patient with grimages and great c/o pain inhibiting participation today.  Pain meds already given and not scheduled again for 45 more minutes per RN report  PM session:  Static standing balance and weight bearing to increase proprioceptive feedback in UEs and LEs and to decrease c/o pain in order to ease self care and overall mobility.  Also completed UE stretching and AROM with limits in shoulder flexion and abduction due to what patient reported as pain throughout his arms     Therapy Documentation Precautions:  Precautions Precautions: Fall Precaution Comments: contact- C. diff Restrictions Weight Bearing Restrictions: No  Pain: 8/10 "low back and feet"  See FIM for current functional status  Therapy/Group: Individual Therapy  Alfredia Ferguson [facility] 06/01/2012, 4:05 PM

## 2012-06-01 NOTE — Progress Notes (Signed)
Subjective/Complaints: Slept a little. No specific new complaints. Wants to know what he can do to "improve" between therapies.  A 12 point review of systems has been performed and if not noted above is otherwise negative.   Objective: Vital Signs: Blood pressure 119/69, pulse 61, temperature 98.1 F (36.7 C), temperature source Oral, resp. rate 18, height 6' (1.829 m), weight 94.9 kg (209 lb 3.5 oz), SpO2 93.00%. No results found.  Basename 05/29/12 1327  WBC 10.6*  HGB 16.7  HCT 47.0  PLT 199    Basename 05/30/12 0705  NA 141  K 2.9*  CL 100  GLUCOSE 197*  BUN 13  CREATININE 1.03  CALCIUM 9.1   CBG (last 3)   Basename 06/01/12 0732 05/31/12 2051 05/31/12 1705  GLUCAP 225* 170* 165*    Wt Readings from Last 3 Encounters:  05/30/12 94.9 kg (209 lb 3.5 oz)    Physical Exam:  HENT:  Head: Normocephalic. Oral mucosa pink and moist  Eyes:  Pupils round and reactive to light  Neck: Neck supple. No thyromegaly present. No jvd  Cardiovascular: Normal rate and regular rhythm. No murmur  Pulmonary/Chest: Effort normal and breath sounds normal. He has no wheezes. No distress  Abdominal: Soft. Bowel sounds are normal. There is no tenderness.  Musculoskeletal: He exhibits no edema. Back exhibits tenderness to rom and palpation in the lumbar area  Neurological: He is alert.  Patient is a poor historian. He was able to state his appropriate age and date of birth. He could not name the hospital or situation. He did follow simple commands. Very distracted. Affect flat. RUE is 2+ to 3-/5 . RLE is 1+ prox to 3- distally. Probably stocking glove sensory loss in both legs also.Sensation 1/2 Right arm,leg. Did withdraw to pain. Toes non reactive. Infrequently makes eye contact. Right facial droop. Speech slurred.  Psych: affect extremely flat. Appears depressed.  Skin: Skin is warm and dry.    Assessment/Plan: 1. Functional deficits secondary to left PLIC into thalamus thrombotic  infarct which require 3+ hours per day of interdisciplinary therapy in a comprehensive inpatient rehab setting. Physiatrist is providing close team supervision and 24 hour management of active medical problems listed below. Physiatrist and rehab team continue to assess barriers to discharge/monitor patient progress toward functional and medical goals. FIM: FIM - Bathing Bathing Steps Patient Completed: Chest;Right upper leg;Left upper leg Bathing: 2: Max-Patient completes 3-4 43f 10 parts or 25-49%  FIM - Upper Body Dressing/Undressing Upper body dressing/undressing: 0: Wears gown/pajamas-no public clothing FIM - Lower Body Dressing/Undressing Lower body dressing/undressing: 0: Wears gown/pajamas-no public clothing  FIM - Toileting Toileting: 0: Activity did not occur     FIM - IT sales professional Transfer: 2: Bed > Chair or W/C: Max A (lift and lower assist);2: Chair or W/C > Bed: Max A (lift and lower assist)  FIM - Locomotion: Wheelchair Distance: 160 Locomotion: Wheelchair: 5: Travels 150 ft or more: maneuvers on rugs and over door sills with supervision, cueing or coaxing FIM - Locomotion: Ambulation Ambulation/Gait Assistance: 1: +2 Total assist Locomotion: Ambulation: 1: Two helpers  Comprehension Comprehension Mode: Auditory Comprehension: 5-Follows basic conversation/direction: With extra time/assistive device  Expression Expression Mode: Verbal Expression: 5-Expresses basic needs/ideas: With extra time/assistive device  Social Interaction Social Interaction: 5-Interacts appropriately 90% of the time - Needs monitoring or encouragement for participation or interaction.  Problem Solving Problem Solving: 5-Solves basic 90% of the time/requires cueing < 10% of the time  Memory Memory: 3-Recognizes or recalls  50 - 74% of the time/requires cueing 25 - 49% of the time  Medical Problem List and Plan:  1. Left thrombotic PLIC. infarct  2. DVT  Prophylaxis/Anticoagulation: Subcutaneous Lovenox. Monitor platelet counts any signs of bleeding  3. Pain Management: Hydrocodone as needed. Monitor with increased mobility   -add neurontin for neuropathic pain (home med) 4. Neuropsych: This patient is not capable of making decisions on his/her own behalf.   -team to provide egosupport. Depression eval this week.  -needs a lot of positive reinforcement. Also cognitive-linguistic issues 5. Clostridium difficile. Flagyl 500 mg every 8 hours initiated 05/29/2012  -diarrhea improved 6. Hypertension. Hydrochlorothiazide 12.5 mg daily. Monitor the increased mobility  7. Borderline diabetes mellitus. Latest hemoglobin A1c 5.3. Accu-Cheks have been discontinued. But sugars been within acceptable limits generally 8. Tobacco abuse. NicoDerm patch. Provide counseling.  9. Hyperlipidemia. Zocor 10. Hypokalemia: recheck today  LOS (Days) 2 A FACE TO FACE EVALUATION WAS PERFORMED  SWARTZ,ZACHARY T 06/01/2012 8:20 AM

## 2012-06-01 NOTE — Progress Notes (Signed)
Physical Therapy Note  Patient Details  Name: Eric Scott MRN: [id number] Date of Birth: 06/29/1950 Today's Date: 06/01/2012  10:30 - 11:00 30 minutes Individual session Patient denies pain.  Treatment focused on wheelchair mobility, transfers, sitting balance and gait. Patient propelled wheelchair 50 feet using bilateral UE's on level tile in controlled environment. Patient performed sliding board transfer with moderate assist once wheelchair was set-up and board placed. Patient worked on sitting balance trying to decrease upper extremity support. Patient sit to stand with max assist for lifting and lowering. Patient ambulated with rolling walker 16 feet with moderate assistance for weight shifting to left in order to progress right LE. Patient required verbal cueing to increase step length on right. patient pushes to right during gait. Patient returned to room with call bell within reach.   Elder Love M 06/01/2012, 12:14 PM

## 2012-06-01 NOTE — Progress Notes (Signed)
Physical Therapy Note  Patient Details  Name: Eric Scott MRN: [id number] Date of Birth: [birth year] Today's Date: 06/01/2012  1500-1555 (55 minutes) individual Pain: no reported c/o pain Focus of treatment: gait training Treatment: supine to side to sit mod assist (bed) ; sit to supine (bed) mod/max assist for bilateral LEs and positioning trunk; sit to stand from wc to RW mod assist with tactile cues for hand placement; gait X 1( 10 steps) with RW mod/max assist followed by wc secondary to decreased endurance - pt able to advance RT LE with increased time and tactile cues for weight shift to left (pt leans to right); gait X 3 (10 steps) with RT PFRW as above but minimal decrease lean to right.    Nasia Cannan,JIM 06/01/2012, 7:44 AM

## 2012-06-02 ENCOUNTER — Inpatient Hospital Stay (HOSPITAL_COMMUNITY): Payer: Medicare Other

## 2012-06-02 ENCOUNTER — Inpatient Hospital Stay (HOSPITAL_COMMUNITY): Payer: Medicare Other | Admitting: Speech Pathology

## 2012-06-02 DIAGNOSIS — A0472 Enterocolitis due to Clostridium difficile, not specified as recurrent: Secondary | ICD-10-CM

## 2012-06-02 DIAGNOSIS — F329 Major depressive disorder, single episode, unspecified: Secondary | ICD-10-CM

## 2012-06-02 DIAGNOSIS — I6330 Cerebral infarction due to thrombosis of unspecified cerebral artery: Secondary | ICD-10-CM

## 2012-06-02 LAB — COMPREHENSIVE METABOLIC PANEL
ALT: 19 U/L (ref 0–53)
AST: 22 U/L (ref 0–37)
Albumin: 3.7 g/dL (ref 3.5–5.2)
CO2: 29 mEq/L (ref 19–32)
Chloride: 97 mEq/L (ref 96–112)
Creatinine, Ser: 1.18 mg/dL (ref 0.50–1.35)
Sodium: 138 mEq/L (ref 135–145)
Total Bilirubin: 0.6 mg/dL (ref 0.3–1.2)

## 2012-06-02 LAB — CBC WITH DIFFERENTIAL/PLATELET
Basophils Absolute: 0.1 10*3/uL (ref 0.0–0.1)
Basophils Relative: 0 % (ref 0–1)
HCT: 49.9 % (ref 39.0–52.0)
Lymphocytes Relative: 17 % (ref 12–46)
MCHC: 34.1 g/dL (ref 30.0–36.0)
Monocytes Absolute: 0.5 10*3/uL (ref 0.1–1.0)
Neutro Abs: 8.4 10*3/uL — ABNORMAL HIGH (ref 1.7–7.7)
Neutrophils Relative %: 75 % (ref 43–77)
Platelets: 210 10*3/uL (ref 150–400)
RDW: 13.9 % (ref 11.5–15.5)
WBC: 11.2 10*3/uL — ABNORMAL HIGH (ref 4.0–10.5)

## 2012-06-02 LAB — GLUCOSE, CAPILLARY: Glucose-Capillary: 199 mg/dL — ABNORMAL HIGH (ref 70–99)

## 2012-06-02 NOTE — Progress Notes (Signed)
Inpatient Diabetes Program Recommendations  AACE/ADA: New Consensus Statement on Inpatient Glycemic Control (2013)  Target Ranges:  Prepandial:   less than 140 mg/dL      Peak postprandial:   less than 180 mg/dL (1-2 hours)      Critically ill patients:  140 - 180 mg/dL   Reason for Visit: Hyperglycemia  Inpatient Diabetes Program Recommendations Insulin - Basal: Pt would benefit from some basal insulin while here.  Start with 10-15 units q d  (fasting glucose is high.)  Note: Noted HgbA1C of 5.3% while only on Metformin at home.  However with onset of CVA, pt appears to need some basal insulin while here.

## 2012-06-02 NOTE — Progress Notes (Signed)
Occupational Therapy Session Note  Patient Details  Name: Eric Scott MRN: [id number] Date of Birth: 1950/09/15  Today's Date: 06/02/2012 Time: 0730-0830 (1030-1100) Time Calculation (min): 60 min (62min)  Short Term Goals: Week 1:  OT Short Term Goal 1 (Week 1): Patient will bathe self with mod assist OT Short Term Goal 2 (Week 1): Patient will transfer to toilet with min assist OT Short Term Goal 3 (Week 1): Patient will transfer to shower bench with min assist OT Short Term Goal 4 (Week 1): Patient will maintain static sitting balance on shower bench with verbal cueing, frequent  Skilled Therapeutic Interventions/Progress Updates:    Session 1:  ADL re-training completed at sink level this AM. Session with focus on sit to stands, ADL performance, functional transfers, sitting and standing balance, and cognition. Patient is very slow moving and slow to respond to questions. Pt required vc's for task sequencing during bathing. When standing, patient's right leg turns outward to the right and needs vc's to turn foot to face forward. Patient heavily leans to the right when standing and needs cues to put more weight on left leg.   Session 2: 5/10 pain level in back. Therex session completed supine on mat table to increase ROM and Bil UE strength and endurance. Patient completed transfer w/c<>mat table with Min Assist and vc's for technique. See below for exercises.     06/02/12 1100  General Exercises - Upper Extremity  Shoulder Flexion AAROM;Both;5 reps;Supine;Limitations  Shoulder Flexion Limitations Diff. w/ R UE. Min Assist with vc's.  Shoulder Extension AAROM;Both;5 reps;Supine  Other Exercises  Other Exercises Scapular protraction; AAROM; 5 reps; Min Assist with vc/s. difficulty managing R UE.    Therapy Documentation Precautions:  Precautions Precautions: Fall Precaution Comments: contact- C. diff Restrictions Weight Bearing Restrictions: No Pain: Pain  Assessment Pain Assessment: 0-10 Pain Score:   5 Pain Type: Chronic pain Pain Location: Back Pain Orientation: Lower (also bil feet) Pain Descriptors: Aching Pain Frequency: Occasional Pain Onset: Gradual Patients Stated Pain Goal: 2 Pain Intervention(s): Medication (See eMAR) Multiple Pain Sites: Yes 2nd Pain Site Pain Score: 8 Pain Type: Chronic pain Pain Location: Foot Pain Orientation: Right;Left Pain Intervention(s): RN made aware  See FIM for current functional status  Therapy/Group: Individual Therapy  Ailene Ravel, OTR/L 06/02/2012, 11:18 AM

## 2012-06-02 NOTE — Progress Notes (Signed)
Speech Language Pathology Daily Session Note  Patient Details  Name: Eric Scott MRN: [id number] Date of Birth: [birth year]  Today's Date: 06/02/2012 Time: [id number] Time Calculation (min): 25 min  Short Term Goals: Week 1: SLP Short Term Goal 1 (Week 1): Pt. will improve speech intelligibility in challenging communicative environments utilizing strategies with min verbal/visual cues. SLP Short Term Goal 2 (Week 1): Pt. will demonstrate alternating attention in functional activities in moderately distracting environment with min verbal/visual cues. SLP Short Term Goal 3 (Week 1): Pt. will evaluate/self monitor performance during various activities with moderate verbal cues. SLP Short Term Goal 4 (Week 1): Pt. will recall and utilize strategies to assist with working and prospective memory with mod verbal/visual cues.  Skilled Therapeutic Interventions: Skilled treatment focused on addressing awareness and speech intelligibility.  SLP facilitated session with max assist to review diagnosis that resulted in hospoital stay and new deficits.  Patient took phone call to order food and was asked to repeat himself several times as a result, SLP also facilitated session with verbal education and handout for speech intelligiblity strategies; patient needs reinforcement.     FIM:  Comprehension Comprehension Mode: Auditory Comprehension: 5-Understands basic 90% of the time/requires cueing < 10% of the time Expression Expression Mode: Verbal Expression: 4-Expresses basic 75 - 89% of the time/requires cueing 10 - 24% of the time. Needs helper to occlude trach/needs to repeat words. Social Interaction Social Interaction: 5-Interacts appropriately 90% of the time - Needs monitoring or encouragement for participation or interaction. Problem Solving Problem Solving: 2-Solves basic 25 - 49% of the time - needs direction more than half the time to initiate, plan or complete simple  activities Memory Memory: 2-Recognizes or recalls 25 - 49% of the time/requires cueing 51 - 75% of the time  Pain Pain Assessment Pain Assessment: No/denies pain  Therapy/Group: Individual Therapy  Carmelia Roller., CCC-SLP [id number]  [place] 06/02/2012, 3:23 PM

## 2012-06-02 NOTE — Progress Notes (Signed)
Patient information reviewed and entered into eRehab system by Davieon Stockham, RN, CRRN, PPS Coordinator.  Information including medical coding and functional independence measure will be reviewed and updated through discharge.     Per nursing patient was given "Data Collection Information Summary for Patients in Inpatient Rehabilitation Facilities with attached "Privacy Act Statement-Health Care Records" upon admission.  

## 2012-06-02 NOTE — Progress Notes (Signed)
Subjective/Complaints: NO Specific complaints. Denies pain. Intake fair  A 12 point review of systems has been performed and if not noted above is otherwise negative.   Objective: Vital Signs: Blood pressure 135/70, pulse 65, temperature 97.7 F (36.5 C), temperature source Oral, resp. rate 20, height 6' (1.829 m), weight 94.9 kg (209 lb 3.5 oz), SpO2 95.00%. No results found. No results found for this basename: WBC:2,HGB:2,HCT:2,PLT:2 in the last 72 hours  Basename 06/01/12 0949  NA 141  K 4.5  CL 101  GLUCOSE 177*  BUN 31*  CREATININE 1.21  CALCIUM 9.7   CBG (last 3)   Basename 06/01/12 2135 06/01/12 1651 06/01/12 1118  GLUCAP 222* 196* 214*    Wt Readings from Last 3 Encounters:  05/30/12 94.9 kg (209 lb 3.5 oz)    Physical Exam:  HENT:  Head: Normocephalic. Oral mucosa pink and moist  Eyes:  Pupils round and reactive to light  Neck: Neck supple. No thyromegaly present. No jvd  Cardiovascular: Normal rate and regular rhythm. No murmur  Pulmonary/Chest: Effort normal and breath sounds normal. He has no wheezes. No distress  Abdominal: Soft. Bowel sounds are normal. There is no tenderness.  Musculoskeletal: He exhibits no edema. Back exhibits tenderness to rom and palpation in the lumbar area  Neurological: He is alert.  Patient is a poor historian. He was able to state his appropriate age and date of birth.  He did follow simple commands.  . Affect flat. RUE is 2+ to 3-/5 . RLE is 1+ prox to 3- distally. Probably stocking glove sensory loss in both legs also.Sensation 1/2 Right arm,leg. Did withdraw to pain. Toes non reactive. Infrequently makes eye contact. Right facial droop. Speech sl dysarthric  Psych: affect extremely flat. Appears depressed.  Skin: Skin is warm and dry.    Assessment/Plan: 1. Functional deficits secondary to left PLIC into thalamus thrombotic infarct which require 3+ hours per day of interdisciplinary therapy in a comprehensive inpatient rehab  setting. Physiatrist is providing close team supervision and 24 hour management of active medical problems listed below. Physiatrist and rehab team continue to assess barriers to discharge/monitor patient progress toward functional and medical goals. FIM: FIM - Bathing Bathing Steps Patient Completed: Chest;Right Arm;Left Arm;Abdomen;Right upper leg;Left upper leg Bathing: 3: Mod-Patient completes 5-7 13f 10 parts or 50-74%  FIM - Upper Body Dressing/Undressing Upper body dressing/undressing steps patient completed: Pull shirt over trunk;Put head through opening of pull over shirt/dress;Thread/unthread left sleeve of pullover shirt/dress;Thread/unthread right sleeve of pullover shirt/dresss Upper body dressing/undressing: 5: Supervision: Safety issues/verbal cues FIM - Lower Body Dressing/Undressing Lower body dressing/undressing: 1: Total-Patient completed less than 25% of tasks  FIM - Toileting Toileting: 0: Activity did not occur     FIM - Control and instrumentation engineer Devices: Sliding board Bed/Chair Transfer: 3: Chair or W/C > Bed: Mod A (lift or lower assist)  FIM - Locomotion: Wheelchair Distance: 160 Locomotion: Wheelchair: 2: Travels 50 - 149 ft with supervision, cueing or coaxing FIM - Locomotion: Ambulation Locomotion: Ambulation Assistive Devices: Administrator Ambulation/Gait Assistance: 3: Mod assist Locomotion: Ambulation: 1: Travels less than 50 ft with moderate assistance (Pt: 50 - 74%)  Comprehension Comprehension Mode: Auditory Comprehension: 5-Understands basic 90% of the time/requires cueing < 10% of the time  Expression Expression Mode: Verbal Expression: 4-Expresses basic 75 - 89% of the time/requires cueing 10 - 24% of the time. Needs helper to occlude trach/needs to repeat words.  Social Interaction Social Interaction: 5-Interacts appropriately 90% of  the time - Needs monitoring or encouragement for participation or  interaction.  Problem Solving Problem Solving: 3-Solves basic 50 - 74% of the time/requires cueing 25 - 49% of the time  Memory Memory: 3-Recognizes or recalls 50 - 74% of the time/requires cueing 25 - 49% of the time  Medical Problem List and Plan:  1. Left thrombotic PLIC. infarct  2. DVT Prophylaxis/Anticoagulation: Subcutaneous Lovenox. Monitor platelet counts any signs of bleeding  3. Pain Management: Hydrocodone as needed. Monitor with increased mobility   -added neurontin for neuropathic pain (home med) which seems to have helped a little 4. Neuropsych: This patient is not capable of making decisions on his/her own behalf.   -team to provide egosupport. Depression eval this week.  -needs a lot of positive reinforcement. Also cognitive-linguistic issues 5. Clostridium difficile. Flagyl 500 mg every 8 hours initiated 05/29/2012  -diarrhea improved 6. Hypertension. Hydrochlorothiazide 12.5 mg daily. Monitor the increased mobility  7. Borderline diabetes mellitus. Latest hemoglobin A1c 5.3. Will change to [facility] diet 8. Tobacco abuse. NicoDerm patch. Provide counseling.  9. Hyperlipidemia. Zocor 10. Hypokalemia: improved on labs yesterday  -need to encourage po intake however  LOS (Days) 3 A FACE TO FACE EVALUATION WAS PERFORMED  SWARTZ,ZACHARY T 06/02/2012 7:48 AM

## 2012-06-02 NOTE — Progress Notes (Signed)
Physical Therapy Session Note  Patient Details  Name: Eric Scott MRN: [id number] Date of Birth: 1951-01-03  Today's Date: 06/02/2012 Time: 0907-1002 and 1105-1130 Time Calculation (min): 55 min and 25 min  Short Term Goals: Week 1:  PT Short Term Goal 1 (Week 1): Pt will perform basic transfer with mod assist to L. PT Short Term Goal 2 (Week 1): Pt will propel w/c x 50' with min assist in congested area. PT Short Term Goal 3 (Week 1): Pt will perform gait x 25' with assistance of 1 person. PT Short Term Goal 4 (Week 1): Pt will ascend and descend 5 steps with 2 rails with mod assist. PT Short Term Goal 5 (Week 1): Pt will stand x 5 minutes at table during bil UE activity with mod assist for balance.  Skilled Therapeutic Interventions/Progress Updates:   1st tx- Pt performed oral care from sitting at sink, supervision.  W/c mobility using bil hands, x 100' with supervision.    W/c>< mat to R and L, stand pivot transfer with min assist, unsteady, but iwthout R lean or LOB.  neuromuscular re-education while sitting on wedge R, to = wt bearing bil hips; via forced use, demo, VCs for: -trunk shortening/lengthening during L diagonal wt shifting, retrieving items with L hand -forward wt shifting in sitting while retrieving items from the floor, to increase anterior pelvic tilt  neuromuscular re-education via manual cues, VCs for: -bed mobility facilitating abdominal and RLE activity, slowly with supervision and VCs for RLE (pt reported that moving in the bed is his greatest difficulty right now) -AA bil bridging without use of UEs to facilitate pelvic mobility and moving > HOB  2nd tx  neuromuscular re-education as above plus visual feedback using mirror, for: - Sit>< stand x 2, VCs for appropriate use of R hand, supervision > min assist -static standing in hall using L rail, focusing on R stance control, with use of mirror for midline orientation and R knee extension -gait in hall  way using rail on L, focusing on R stance control, x 8' with max assist, for midline orientation, upright trunk and forward gaze; pt leaned R intermittently, but did correct with VCs and mirror -up/down 3 steps 5" high, 2 rails, mod > max assist step-to method, focusing on R hip and knee extension.  Pt required assist for balance due to R lean.  As pt descended last step, +2 needed due to pt's difficulty using R hand on rail, and turning to sit.     Therapy Documentation Precautions:  Precautions Precautions: Fall Precaution Comments: contact- C. diff Restrictions Weight Bearing Restrictions: No   Pain: Pain Assessment Pain Score:   5 Pain Location: Back Pain Orientation: Lower (also bil feet) Pain Intervention(s): Medication (See eMAR) General Exercises - Upper Extremity Shoulder Flexion: AAROM;Both;5 reps;Supine;Limitations Shoulder Flexion Limitations: Diff. w/ R UE. Min Assist with vc's. Shoulder Extension: AAROM;Both;5 reps;Supine Other Exercises Other Exercises: Scapular protraction; AAROM; 5 reps; Min Assist with vc/s. difficulty managing R UE.   See FIM for current functional status  Therapy/Group: Individual Therapy  Charlen Bakula 06/02/2012, 1:15 PM

## 2012-06-03 ENCOUNTER — Inpatient Hospital Stay (HOSPITAL_COMMUNITY): Payer: Medicare Other | Admitting: *Deleted

## 2012-06-03 ENCOUNTER — Inpatient Hospital Stay (HOSPITAL_COMMUNITY): Payer: Medicare Other | Admitting: Speech Pathology

## 2012-06-03 ENCOUNTER — Inpatient Hospital Stay (HOSPITAL_COMMUNITY): Payer: Medicare Other

## 2012-06-03 LAB — BASIC METABOLIC PANEL
CO2: 32 mEq/L (ref 19–32)
GFR calc non Af Amer: 61 mL/min — ABNORMAL LOW (ref >90)
Glucose, Bld: 148 mg/dL — ABNORMAL HIGH (ref 70–99)
Potassium: 5.2 mEq/L — ABNORMAL HIGH (ref 3.5–5.1)
Sodium: 137 mEq/L (ref 135–145)

## 2012-06-03 LAB — URINE MICROSCOPIC-ADD ON

## 2012-06-03 LAB — URINALYSIS, ROUTINE W REFLEX MICROSCOPIC
Bilirubin Urine: NEGATIVE
Glucose, UA: 100 mg/dL — AB
Hgb urine dipstick: NEGATIVE
Ketones, ur: NEGATIVE mg/dL
Leukocytes, UA: NEGATIVE
Protein, ur: 30 mg/dL — AB
pH: 7.5 (ref 5.0–8.0)

## 2012-06-03 LAB — GLUCOSE, CAPILLARY
Glucose-Capillary: 147 mg/dL — ABNORMAL HIGH (ref 70–99)
Glucose-Capillary: 176 mg/dL — ABNORMAL HIGH (ref 70–99)
Glucose-Capillary: 136 mg/dL — ABNORMAL HIGH (ref 70–99)
Glucose-Capillary: 281 mg/dL — ABNORMAL HIGH (ref 70–99)

## 2012-06-03 NOTE — Progress Notes (Signed)
Social Work Assessment and Plan Social Work Assessment and Plan  Patient Details  Name: Eric Scott MRN: [id number] Date of Birth: 09/24/1950  Today's Date: 06/03/2012  Problem List:  Patient Active Problem List  Diagnosis  . Acute lacunar stroke  . DM type 2 (diabetes mellitus, type 2)  . Tobacco abuse  . HTN (hypertension)  . Chronic pain (back, legs)  . Toe laceration, 4th toe  . C. difficile diarrhea  . Hypokalemia  . CVA (cerebral infarction)   Past Medical History:  Past Medical History  Diagnosis Date  . Hypertension   . Hypercholesteremia   . Type II diabetes mellitus   . Diabetic peripheral neuropathy     "chronic" (05/27/2012)  . Stroke 05/2012    Subacute, lacunar infarcts within the left basal ganglia and posterior limp of the left internal capsule/thalamus; "RUE; both feet weak" (05/27/2012)  . Anxiety   . Spinal stenosis     mild lumbar (MRI 05/2012)-L2-L3 to L4-L5 , mild lumbar foraminal stenosis   . Chronic pain     legs, back; MRI 05/2012 with mild thoracic degenerative changes no spinal stenosis    Past Surgical History:  Past Surgical History  Procedure Date  . Hernia repair 01/05/2004    "belly button" (05/27/2012)   Social History:  reports that he has been smoking Cigarettes.  He has a 90 pack-year smoking history. He has never used smokeless tobacco. He reports that he does not drink alcohol or use illicit drugs.  Family / Support Systems Marital Status: Separated Patient Roles: Parent;Other (Comment) (Spearated from wife, lives with a friend-Johnny) Other Supports: johnny-friend non-working phone Ex-wife Santiago Glad [phone]-cell Anticipated Caregiver: Has no caregiver for discharge this is what depressed him so Ability/Limitations of Caregiver: No caregiver-Johnny can not provide physical care-disabled himself Caregiver Availability: Other (Comment) (Johnny is there but can not assist) Family Dynamics: Distant from family and children.  Pt  reports: " I don't really have anyone."  He appears very down on everything that is going on with him and his health issues  Social History Preferred language: English Religion: Baptist Cultural Background: No issues Education: [employer] Read: Yes Write: Yes Employment Status: Disabled Freight forwarder Issues: No issues Guardian/Conservator: None-according to MD pt is not capable of making his own decisions, since pt is not legallly divorced will go to wife-Karen.  Continue to see if pt progresses to being able to make own decisions   Abuse/Neglect Physical Abuse: Denies Verbal Abuse: Denies Sexual Abuse: Denies Exploitation of patient/patient's resources: Denies Self-Neglect: Denies  Emotional Status Pt's affect, behavior adn adjustment status: Pt is down and worried about what he will do at discharge.  He realizes he will need some help but has no one to provide this help.  Neuro-psych to see this week.  Provided support and will contune tow ork on a realistic plan. Recent Psychosocial Issues: Other medical issues Pyschiatric History: No hisotry-according to pt.  But seems to be depressed didn't want to take depression screen at this time.  Neuro-psych to see this week.   Substance Abuse History: Tobacco was smoking PTA-aware needs to quit and agreeable to a patch.  Patient / Family Perceptions, Expectations & Goals Pt/Family understanding of illness & functional limitations: Pt can explain his stroke and deficits.  He states: " I walked some but it took two people and they tell me I will need help at discharge."  Discussed options with pt and will work with him on them. Premorbid pt/family  roles/activities: retiree, Ex-husband, Friend, father, etc Anticipated changes in roles/activities/participation: resume Pt/family expectations/goals: Pt states: " I want to be indpendent but don't see me reaching this."  He doesn't make eye contact and looks at the floor when talking.     Community Resources [employer]: None Premorbid Home Care/DME Agencies: None Transportation available at discharge: Friend doesn't drive neither does his ex-wife-he was the Engineer, technical sales referrals recommended: Support group (specify);Neuropsychology (CVA Support group)  Discharge Planning Living Arrangements: Non-relatives/Friends Support Systems: Spouse/significant other;Friends/neighbors Type of Residence: Private residence Insurance Resources: Multimedia programmer (specify) Primary school teacher) Financial Resources: SSD Financial Screen Referred: No Living Expenses: Rent Money Management: Patient Do you have any problems obtaining your medications?: No Home Management: He and friend Patient/Family Preliminary Plans: Would need to be independent to be able to retrun to mobile home with friend.  Johnny can not phsycially assist him.  Pt doesn't have anyone to assist him at discharge, one of the main issues that makes him depressed. Social Work Anticipated Follow Up Needs: SNF DC Planning Additional Notes/Comments: Need to work with pt on a safe and realistic discharge plan which is probably NHP  Clinical Impression Pleasant depressed gentleman who is worried and down about his deficits from his stroke.  Discussed options and this doesn't help the situation and may depress pt more. Neuro-psych to evaluate and provide recommendations.  Will touch base with ex-wife pt agreeable to this to see if she can assist any with him.  Work on a safe discharge plan.  Elease Hashimoto 06/03/2012, 10:24 AM

## 2012-06-03 NOTE — Progress Notes (Signed)
Physical Therapy Session Note  Patient Details  Name: Eric Scott MRN: [id number] Date of Birth: [birth year]  Today's Date: 06/03/2012 Time: [id number] Time Calculation (min): 58 min  Short Term Goals: Week 1:  PT Short Term Goal 1 (Week 1): Pt will perform basic transfer with mod assist to L. PT Short Term Goal 2 (Week 1): Pt will propel w/c x 50' with min assist in congested area. PT Short Term Goal 3 (Week 1): Pt will perform gait x 25' with assistance of 1 person. PT Short Term Goal 4 (Week 1): Pt will ascend and descend 5 steps with 2 rails with mod assist. PT Short Term Goal 5 (Week 1): Pt will stand x 5 minutes at table during bil UE activity with mod assist for balance.  Skilled Therapeutic Interventions/Progress Updates:  Tx focused on transfer training, NMR for RLE and balance, and therex for strengthening.  Pt up in [facility] upon arrival, quite fatigued and limited by back and bil foot pain, but agreeable to tx.  Pt propelled WC x80' with bil UEs and cues for R stroke length for efficiency, limited by fatigue.  Sit<>stand x4 in // bars with Min A for lifting and cues for scooting to edge of chair. Pt also cued to focus on RLE performing work of transfer and to control descent. Pt stood 3x60' with Min>Mod A as fatigue increased and mirror for visual feedback for midline orientation. Pt unable to attain or maintain full upright posture due to LBP, but able to adjust somewhat. Pt performed cone taps bil x10 with decreased balance while tapping with RLE. Therapist guarding at knees for safety, but no buckling noted. Pt needing seated rest breaks after each trial.  Seated therex for LE strengthening: bil marching and LAQ in decreased ROM due to weakness.  Nustep x47min with bil UE and LE, level 3. Pt limited by fatigue, averaging 25spm with 2 rest breaks needed.  Stand-step transfer with Mod A to maintain anterior translation and balance WC>bed. Min A for sit>supine for RLE lifting. Pt  needing cues to adjust in bed, bridging x3 to advance hips.  Pt left in bed with alarm on all needs in reach.       Therapy Documentation Precautions:  Precautions Precautions: Fall Precaution Comments: contact- C. diff Restrictions Weight Bearing Restrictions: No   Pain: LBP and bil foot pain, but pt not able to rate, modified tx prn and assisted back to bed at end of tx.      Locomotion : Wheelchair Mobility Distance: 50   See FIM for current functional status  Therapy/Group: Individual Therapy  Kennieth Rad, PT, DPT  06/03/2012, 12:33 PM

## 2012-06-03 NOTE — Patient Care Conference (Signed)
Inpatient RehabilitationTeam Conference and Plan of Care Update Date: 06/03/2012   Time: 1:45 PM    Patient Name: Eric Scott      Medical Record Number: [id number]  Date of Birth: 02-May-1951 Sex: Male         Room/Bed: 4032/4032-01 Payor Info: Payor: Theme park manager MEDICARE  Plan: AARP MEDICARE COMPLETE  Product Type: *No Product type*     Admitting Diagnosis: L CVA  Admit Date/Time:  05/30/2012  3:47 PM Admission Comments: No comment available   Primary Diagnosis:  CVA (cerebral infarction) Principal Problem: CVA (cerebral infarction)  Patient Active Problem List   Diagnosis Date Noted  . CVA (cerebral infarction) 06/01/2012  . Chronic pain (back, legs) 05/30/2012  . Toe laceration, 4th toe 05/30/2012  . C. difficile diarrhea 05/30/2012  . Hypokalemia 05/30/2012  . Acute lacunar stroke 05/27/2012  . DM type 2 (diabetes mellitus, type 2) 05/27/2012  . Tobacco abuse 05/27/2012  . HTN (hypertension) 05/27/2012    Expected Discharge Date:    Team Members Present: Physician leading conference: Dr. Alger Simons Social Worker Present: Ovidio Kin, LCSW Nurse Present: Dorien Chihuahua, RN PT Present: Raylene Everts, PT;Other (comment) Shelton Silvas Ripa-PT) OT Present: Antony Salmon, Ellen Henri, OT SLP Present: Gunnar Fusi, SLP Other (Discipline and Name): Jake Michaelis Coordinator     Current Status/Progress Goal Weekly Team Focus  Medical   CVA, complicated by c diff diarrhea  maintain nutrition and electrolytes, increase functional use of right side, increase awareness  electrolyte matinenance, rx C diff, control sugars and bp   Bowel/Bladder   Incontinent of bowel and bladder. LBM 12/27. Pt currently has c-diff  Continent of bowel and bladder  Timed toileing q 3hrs    Swallow/Nutrition/ Hydration             ADL's   Patient is currently Supervision for UB dressing; Min Assist for grooming/LB dressing; Mod Assist for bathing. Functional transfer: Min-Max Assist  at times. Needs step by step instructions for technique. Slow motor planning and respond to questions.  Supervision to [place].  Sitting balance, Bil UE strengthening, functional transfers, ADL performance, standing balance.   Mobility   min assist squat pivot transfer, supervision w/c x 100' controlled, max assist gait x 8' with hallway rail  supervision basic transfers and w/c x 150' controlled, 50' home env; min assist gait x 150'  controlled, 50' home including ramp, and 4 steps  neuro re-ed, midline orientation, bed mobility, transfers, gait   Communication   mod assist  supervision   increase awareness and use of compensatory strategies   Safety/Cognition/ Behavioral Observations  mod-max assist  min assist  increase awareness basic problem solving   Pain   Vicodin q 4hrs prn  <3  Offer pain medication 1 hr prior to initial therapy session   Skin   Scattered bruising on BUE  No additonal skin breakdown  Routine turn q 2hrs      *See Interdisciplinary Assessment and Plan and progress notes for [name] and short-term goals  Barriers to Discharge: limited inisght and awareness, bordeline nutrition    Possible Resolutions to Barriers:  ?SNF    Discharge Planning/Teaching Needs:  Pt has no caregiver-friend can only do supervision level. Probable NHP      Team Discussion:  Pt has deficits in safety, attention and cognition.  Will need care at discharge but has no caregiver.  Revisions to Treatment Plan:  Probable NHP barriers cognition, safety and no caregiver   Continued Need for  Acute Rehabilitation Level of Care: The patient requires daily medical management by a physician with specialized training in physical medicine and rehabilitation for the following conditions: Daily direction of a multidisciplinary physical rehabilitation program to ensure safe treatment while eliciting the highest outcome that is of practical value to the patient.: Yes Daily medical management of patient  stability for increased activity during participation in an intensive rehabilitation regime.: Yes Daily analysis of laboratory values and/or radiology reports with any subsequent need for medication adjustment of medical intervention for : Cardiac problems;Neurological problems (c diff diarrhea, ? depression, pain, DM)  [name], Gardiner Rhyme 06/03/2012, 2:20 PM

## 2012-06-03 NOTE — Progress Notes (Signed)
Occupational Therapy Session Note  Patient Details  Name: Eric Scott MRN: [id number] Date of Birth: 05/16/1951  Today's Date: 06/03/2012 Time: [id number] Time Calculation (min): 60 min  Short Term Goals: Week 1:  OT Short Term Goal 1 (Week 1): Patient will bathe self with mod assist OT Short Term Goal 2 (Week 1): Patient will transfer to toilet with min assist OT Short Term Goal 3 (Week 1): Patient will transfer to shower bench with min assist OT Short Term Goal 4 (Week 1): Patient will maintain static sitting balance on shower bench with verbal cueing, frequent  Skilled Therapeutic Interventions/Progress Updates:   Pt sat at EOB to eat breakfast with assist to open containers. Pt leans to R, required verbal cues throughout session to center his posture. Pt attempted to pick up fallen utensils multiple times during breakfast, needing verbal cues to not lean over without supervision for safety. Once set up in front of sink, pt bathed, with max A for back, buttocks and feet. Pt required verbal cueing not to pull on catheter, with pt appearing agitated with it. Pt refused to wear protective brief during the day, so regular underwear was donned (pt has condom catheter.) Pt able to stand 1-2 min at sink with R lean. Had pt use mirror while standing to self correct his posture.   Therapy Documentation Precautions:  Precautions Precautions: Fall Precaution Comments: contact precautions-C.diff Restrictions Weight Bearing Restrictions: No General:   Vital Signs: Therapy Vitals Temp: 98.1 F (36.7 C) Temp src: Oral Pulse Rate: 86  Resp: 20  BP: 127/85 mmHg Patient Position, if appropriate: Lying Oxygen Therapy SpO2: 95 % O2 Device: None (Room air) Pain: Pt reports pain of 8/10 in back. Nursing aware. ADL:       See FIM for current functional status  Therapy/Group: Individual Therapy  Amar Sippel Corlis Leak 06/03/2012, 2:25 PM

## 2012-06-03 NOTE — Progress Notes (Signed)
Social Work Patient ID: Eric Scott, male   DOB: [birth year], 61 y.o.   MRN: [id number] Met with pt to inform of team conference goals-supervision/min level and that the team recommends someone to be there to provide Physical care.  Discussed again short term NHP and if he can talk with friend and family regarding them helping him.  Pt wants to be Independent and has always been.  He does not like asking for help.  He realizes he needs assist after sliding to the floor trying to transfer himself to bed. He will talk with friend and see how much assist he can provide.  Work on a safe discharge plan.

## 2012-06-03 NOTE — Progress Notes (Signed)
Speech Language Pathology Daily Session Note  Patient Details  Name: Eric Scott MRN: [id number] Date of Birth: 05-20-1951  Today's Date: 06/03/2012 Time: 1525-1610 Time Calculation (min): 45 min  Short Term Goals: Week 1: SLP Short Term Goal 1 (Week 1): Pt. will improve speech intelligibility in challenging communicative environments utilizing strategies with min verbal/visual cues. SLP Short Term Goal 2 (Week 1): Pt. will demonstrate alternating attention in functional activities in moderately distracting environment with min verbal/visual cues. SLP Short Term Goal 3 (Week 1): Pt. will evaluate/self monitor performance during various activities with moderate verbal cues. SLP Short Term Goal 4 (Week 1): Pt. will recall and utilize strategies to assist with working and prospective memory with mod verbal/visual cues.  Skilled Therapeutic Interventions: Patient seen to address cognitive-linguistic goals, with focus on awareness to deficits and impact, functional problem solving, initiation and maintainance at question-response and conversational level. Patient stated the he wasn't having a good day, and described (with SLP providing mod-max cues for topic maintenance and initiation) how he tried to get out of his wheelchair and into bed by himself. Patient also appeared discouraged or depressed about his situation and  although he did state that he thought he needed more therapy "to get me walking", he didn't seem very interested in SNF level rehab. Patient was noted to not request assistance from SLP for anything, even when he was noticeably struggling to remove sock to show SLP sore on his toe.    FIM:  Comprehension Comprehension Mode: Auditory Comprehension: 5-Understands basic 90% of the time/requires cueing < 10% of the time Expression Expression Mode: Verbal Expression: 4-Expresses basic 75 - 89% of the time/requires cueing 10 - 24% of the time. Needs helper to occlude trach/needs to  repeat words. Social Interaction Social Interaction: 4-Interacts appropriately 75 - 89% of the time - Needs redirection for appropriate language or to initiate interaction. Problem Solving Problem Solving: 3-Solves basic 50 - 74% of the time/requires cueing 25 - 49% of the time Memory Memory: 2-Recognizes or recalls 25 - 49% of the time/requires cueing 51 - 75% of the time FIM - Eating Eating Activity: 5: Set-up assist for open containers;5: Needs verbal cues/supervision (pt unsafely reached for fallen utensil several times)  Pain Pain Assessment Pain Assessment:  (patient did not rate pain, but indicated right toe pain) Pain Score: 0-No pain Pain Type: Chronic pain;Acute pain Pain Location: Toe (Comment which one) (right 4th metatarsal) Pain Descriptors: Aching;Sharp Pain Onset: Gradual Patients Stated Pain Goal: Other (Comment) (patient did not respond) Pain Intervention(s): RN made aware Multiple Pain Sites: No  Therapy/Group: Individual Therapy  Dannial Monarch 06/03/2012, 5:20 PM  Sonia Baller, MA, CCC-SLP [facility] Speech-Language Pathologist

## 2012-06-03 NOTE — Progress Notes (Signed)
Subjective/Complaints: Still with urine incontinence  A 12 point review of systems has been performed and if not noted above is otherwise negative.   Objective: Vital Signs: Blood pressure 128/79, pulse 78, temperature 98 F (36.7 C), temperature source Oral, resp. rate 19, height 6' (1.829 m), weight 94.9 kg (209 lb 3.5 oz), SpO2 93.00%. No results found.  Basename 06/02/12 1012  WBC 11.2*  HGB 17.0  HCT 49.9  PLT 210    Basename 06/03/12 0702 06/02/12 1012  NA 137 138  K 5.2* 4.6  CL 97 97  GLUCOSE 148* 249*  BUN 33* 35*  CREATININE 1.24 1.18  CALCIUM 9.7 9.4   CBG (last 3)   Basename 06/03/12 0742 06/02/12 2130 06/02/12 1653  GLUCAP 151* 176* 199*    Wt Readings from Last 3 Encounters:  05/30/12 94.9 kg (209 lb 3.5 oz)    Physical Exam:  HENT:  Head: Normocephalic. Oral mucosa pink and moist  Eyes:  Pupils round and reactive to light  Neck: Neck supple. No thyromegaly present. No jvd  Cardiovascular: Normal rate and regular rhythm. No murmur  Pulmonary/Chest: Effort normal and breath sounds normal. He has no wheezes. No distress  Abdominal: Soft. Bowel sounds are normal. There is no tenderness.  Musculoskeletal: He exhibits no edema. Back exhibits tenderness to rom and palpation in the lumbar area  Neurological: He is alert.  Patient is a poor historian. He was able to state his appropriate age and date of birth.  He did follow simple commands.  . Affect flat. RUE is 2+ to 3-/5 . RLE is 1+ prox to 3- distally. Probably stocking glove sensory loss in both legs also.Sensation 1/2 Right arm,leg. Did withdraw to pain. Toes non reactive. Infrequently makes eye contact. Right facial droop. Speech sl dysarthric  Psych: affect extremely flat. Appears depressed.  Skin: Skin is warm and dry.    Assessment/Plan: 1. Functional deficits secondary to left PLIC into thalamus thrombotic infarct which require 3+ hours per day of interdisciplinary therapy in a comprehensive  inpatient rehab setting. Physiatrist is providing close team supervision and 24 hour management of active medical problems listed below. Physiatrist and rehab team continue to assess barriers to discharge/monitor patient progress toward functional and medical goals. FIM: FIM - Bathing Bathing Steps Patient Completed: Chest;Right Arm;Left Arm;Abdomen;Front perineal area;Right upper leg;Left upper leg Bathing: 3: Mod-Patient completes 5-7 72f 10 parts or 50-74%  FIM - Upper Body Dressing/Undressing Upper body dressing/undressing steps patient completed: Thread/unthread right sleeve of pullover shirt/dresss;Thread/unthread left sleeve of pullover shirt/dress;Put head through opening of pull over shirt/dress;Pull shirt over trunk Upper body dressing/undressing: 5: Supervision: Safety issues/verbal cues FIM - Lower Body Dressing/Undressing Lower body dressing/undressing steps patient completed: Thread/unthread right pants leg;Thread/unthread left pants leg;Pull pants up/down;Don/Doff right sock;Don/Doff left sock Lower body dressing/undressing: 4: Min-Patient completed 75 plus % of tasks  FIM - Toileting Toileting: 0: Activity did not occur  FIM - Air cabin crew Transfers: 0-Activity did not occur  FIM - Control and instrumentation engineer Devices: Arm rests Bed/Chair Transfer: 3: Supine > Sit: Mod A (lifting assist/Pt. 50-74%/lift 2 legs;5: Sit > Supine: Supervision (verbal cues/safety issues);4: Bed > Chair or W/C: Min A (steadying Pt. > 75%);4: Chair or W/C > Bed: Min A (steadying Pt. > 75%)  FIM - Locomotion: Wheelchair Distance: 160 Locomotion: Wheelchair: 2: Travels 50 - 149 ft with supervision, cueing or coaxing FIM - Locomotion: Ambulation Locomotion: Ambulation Assistive Devices: Other (comment) (hall rail on L) Ambulation/Gait Assistance: 3: Mod  assist Locomotion: Ambulation: 1: Travels less than 50 ft with maximal assistance (Pt: 25 -  49%)  Comprehension Comprehension Mode: Auditory Comprehension: 5-Understands basic 90% of the time/requires cueing < 10% of the time  Expression Expression Mode: Verbal Expression: 4-Expresses basic 75 - 89% of the time/requires cueing 10 - 24% of the time. Needs helper to occlude trach/needs to repeat words.  Social Interaction Social Interaction: 5-Interacts appropriately 90% of the time - Needs monitoring or encouragement for participation or interaction.  Problem Solving Problem Solving: 2-Solves basic 25 - 49% of the time - needs direction more than half the time to initiate, plan or complete simple activities  Memory Memory: 2-Recognizes or recalls 25 - 49% of the time/requires cueing 51 - 75% of the time  Medical Problem List and Plan:  1. Left thrombotic PLIC. infarct  2. DVT Prophylaxis/Anticoagulation: Subcutaneous Lovenox. Monitor platelet counts any signs of bleeding  3. Pain Management: Hydrocodone as needed. Monitor with increased mobility   -added neurontin for neuropathic pain (home med) which seems to have helped a little 4. Neuropsych: This patient is not capable of making decisions on his/her own behalf.   -team to provide egosupport. Depression eval this week.  -needs a lot of positive reinforcement. Also cognitive-linguistic issues 5. Clostridium difficile. Flagyl 500 mg every 8 hours initiated 05/29/2012  -diarrhea improved 6. Hypertension. Hydrochlorothiazide 12.5 mg daily. Monitor the increased mobility  7. Borderline diabetes mellitus. Latest hemoglobin A1c 5.3. Will change to [facility] diet. Add low dose amaryl for better control 8. Tobacco abuse. NicoDerm patch. Provide counseling.  9. Hyperlipidemia. Zocor 10. Hypokalemia: now hyperkalemic- hold k+ and recheck  -need to encourage po intake however,fluids  LOS (Days) 4 A FACE TO FACE EVALUATION WAS PERFORMED  Jerzy Roepke T 06/03/2012 8:19 AM

## 2012-06-03 NOTE — Progress Notes (Signed)
Physical Therapy Session Note  Patient Details  Name: Eric Scott MRN: [id number] Date of Birth: 10/16/1950  Today's Date: 06/03/2012 Time: 0930-1000 Time Calculation (min): 30 min  Short Term Goals: Week 1:  PT Short Term Goal 1 (Week 1): Pt will perform basic transfer with mod assist to L. PT Short Term Goal 2 (Week 1): Pt will propel w/c x 50' with min assist in congested area. PT Short Term Goal 3 (Week 1): Pt will perform gait x 25' with assistance of 1 person. PT Short Term Goal 4 (Week 1): Pt will ascend and descend 5 steps with 2 rails with mod assist. PT Short Term Goal 5 (Week 1): Pt will stand x 5 minutes at table during bil UE activity with mod assist for balance.  Skilled Therapeutic Interventions/Progress Updates:    Patient received sitting in wheelchair. Patient instructed in w/c mobility x150' with supervision. Patient requires max verbal and visual cues for attention to the R to negotiate obstacles on the R. Patient also requires min verbal cues for proper sequencing and technique and requires increased time to complete.   NMR with static standing, with use of L handrail for UE support. Patient demonstrates R lateral trunk lean, R knee buckling, and forward flexed posture. Patient requires mod assist for midline orientation, upright posture, and stabilization of R knee. Patient requires max verbal cues for maintaining eyes ahead to facilitate upright posture. Patient instructed in lateral weight shifts to facilitate increased weight bearing through R LE. Patient performed 2 stands x approximately 1-2 minutes each.  Patient returned to room and left with all needs within reach and safety belt donned.    Therapy Documentation Precautions:  Precautions Precautions: Fall Precaution Comments: contact precautions-C.diff Restrictions Weight Bearing Restrictions: No Pain: Pain Assessment Pain Assessment: No/denies pain Pain Score: 0-No pain Mobility: Transfers Sit to  Stand: 3: Mod assist;With armrests;With upper extremity assist;From chair/3-in-1 Sit to Stand Details: Tactile cues for initiation;Verbal cues for technique;Verbal cues for sequencing;Verbal cues for precautions/safety;Manual facilitation for weight shifting;Manual facilitation for placement;Manual facilitation for weight bearing Sit to Stand Details (indicate cue type and reason): Requires assistance for weight bearing through R LE to prevent R knee buckling Stand to Sit: 3: Mod assist;With upper extremity assist;With armrests;To chair/3-in-1 Stand to Sit Details (indicate cue type and reason): Tactile cues for initiation;Verbal cues for technique;Manual facilitation for weight shifting;Verbal cues for precautions/safety;Manual facilitation for placement;Manual facilitation for weight bearing;Verbal cues for sequencing Locomotion : Ambulation Ambulation/Gait Assistance: Not tested (comment) Wheelchair Mobility Distance: 150   See FIM for current functional status  Therapy/Group: Individual Therapy  Lillia Abed. Khamani Fairley, PT, DPT  06/03/2012, 1:31 PM

## 2012-06-03 NOTE — Care Management Note (Signed)
[facility]patient Rehabilitation Center Individual Statement of Services  Patient Name:  LEONIDES SWILLEY  Date:  06/03/2012  Welcome to the [facility].  Our goal is to provide you with an individualized program based on your diagnosis and situation, designed to meet your specific needs.  With this comprehensive rehabilitation program, you will be expected to participate in at least 3 hours of rehabilitation therapies Monday-Friday, with modified therapy programming on the weekends.  Your rehabilitation program will include the following services:  Physical Therapy (PT), Occupational Therapy (OT), Speech Therapy (ST), 24 hour per day rehabilitation nursing, Neuropsychology, Case Management (RN and Social Worker), Rehabilitation Medicine, Nutrition Services and Pharmacy Services  Weekly team conferences will be held on Wednesday to discuss your progress.  Your RN Case Writer will talk with you frequently to get your input and to update you on team discussions.  Team conferences with you and your family in attendance may also be held.  Expected length of stay: 2-2.5 weeks  Overall anticipated outcome: Supervision/min level  Depending on your progress and recovery, your program may change.  Your RN Case Engineer, production will coordinate services and will keep you informed of any changes.  Your RN Tourist information centre manager and SW names and contact numbers are listed  below.  The following services may also be recommended but are not provided by the [facility] will be made to provide these services after discharge if needed.  Arrangements include referral to agencies that provide these services.  Your insurance has been verified to be:  [employer]-Medicare Your primary doctor is:  Dr. Stoney Bang  Pertinent  information will be shared with your doctor and your insurance company.   Social Worker:  Ovidio Kin, [place]  Information discussed with and copy given to patient by: Elease Hashimoto, 06/03/2012, 10:28 AM

## 2012-06-04 DIAGNOSIS — F329 Major depressive disorder, single episode, unspecified: Secondary | ICD-10-CM

## 2012-06-04 DIAGNOSIS — A0472 Enterocolitis due to Clostridium difficile, not specified as recurrent: Secondary | ICD-10-CM

## 2012-06-04 DIAGNOSIS — I6330 Cerebral infarction due to thrombosis of unspecified cerebral artery: Secondary | ICD-10-CM

## 2012-06-04 LAB — GLUCOSE, CAPILLARY
Glucose-Capillary: 370 mg/dL — ABNORMAL HIGH (ref 70–99)
Glucose-Capillary: 177 mg/dL — ABNORMAL HIGH (ref 70–99)

## 2012-06-04 MED ORDER — DICLOFENAC SODIUM 1 % TD GEL
2.0000 g | Freq: Four times a day (QID) | TRANSDERMAL | Status: DC
  Administered 2012-06-04 – 2012-06-16 (×37): 2 g via TOPICAL
  Filled 2012-06-04: qty 100

## 2012-06-04 NOTE — Progress Notes (Signed)
Subjective/Complaints: Complains of right ankle pain.  A 12 point review of systems has been performed and if not noted above is otherwise negative.   Objective: Vital Signs: Blood pressure 143/70, pulse 76, temperature 98.8 F (37.1 C), temperature source Oral, resp. rate 18, height 6' (1.829 m), weight 94.9 kg (209 lb 3.5 oz), SpO2 95.00%. No results found.  Basename 06/02/12 1012  WBC 11.2*  HGB 17.0  HCT 49.9  PLT 210    Basename 06/03/12 0702 06/02/12 1012  NA 137 138  K 5.2* 4.6  CL 97 97  GLUCOSE 148* 249*  BUN 33* 35*  CREATININE 1.24 1.18  CALCIUM 9.7 9.4   CBG (last 3)   Basename 06/03/12 2247 06/03/12 1713 06/03/12 1159  GLUCAP 281* 136* 299*    Wt Readings from Last 3 Encounters:  05/30/12 94.9 kg (209 lb 3.5 oz)    Physical Exam:  HENT:  Head: Normocephalic. Oral mucosa pink and moist  Eyes:  Pupils round and reactive to light  Neck: Neck supple. No thyromegaly present. No jvd  Cardiovascular: Normal rate and regular rhythm. No murmur  Pulmonary/Chest: Effort normal and breath sounds normal. He has no wheezes. No distress  Abdominal: Soft. Bowel sounds are normal. There is no tenderness.  Musculoskeletal: He exhibits no edema. Right ankle slightly swollen, cool, no instability Neurological: He is alert.  Patient is a poor historian. He was able to state his appropriate age and date of birth.  He did follow simple commands.  . Affect flat. RUE is 2+ to 3-/5 . RLE is 1+ prox to 3- distally. Probably stocking glove sensory loss in both legs also.Sensation 1/2 Right arm,leg. Did withdraw to pain. Toes non reactive. Infrequently makes eye contact. Right facial droop. Speech sl dysarthric  Psych: affect extremely flat. Appears depressed.  Skin: Skin is warm and dry.    Assessment/Plan: 1. Functional deficits secondary to left PLIC into thalamus thrombotic infarct which require 3+ hours per day of interdisciplinary therapy in a comprehensive inpatient  rehab setting. Physiatrist is providing close team supervision and 24 hour management of active medical problems listed below. Physiatrist and rehab team continue to assess barriers to discharge/monitor patient progress toward functional and medical goals. FIM: FIM - Bathing Bathing Steps Patient Completed: Chest;Right Arm;Left Arm;Abdomen;Front perineal area;Right upper leg;Left upper leg Bathing: 3: Mod-Patient completes 5-7 21f 10 parts or 50-74%  FIM - Upper Body Dressing/Undressing Upper body dressing/undressing steps patient completed: Thread/unthread right sleeve of pullover shirt/dresss;Thread/unthread left sleeve of pullover shirt/dress;Put head through opening of pull over shirt/dress;Pull shirt over trunk Upper body dressing/undressing: 5: Supervision: Safety issues/verbal cues FIM - Lower Body Dressing/Undressing Lower body dressing/undressing steps patient completed: Thread/unthread right pants leg;Thread/unthread left pants leg;Pull pants up/down;Don/Doff right sock;Don/Doff left sock Lower body dressing/undressing: 4: Min-Patient completed 75 plus % of tasks  FIM - Toileting Toileting: 0: Activity did not occur  FIM - Air cabin crew Transfers: 0-Activity did not occur  FIM - Control and instrumentation engineer Devices: Arm rests Bed/Chair Transfer: 3: Supine > Sit: Mod A (lifting assist/Pt. 50-74%/lift 2 legs;3: Bed > Chair or W/C: Mod A (lift or lower assist)  FIM - Locomotion: Wheelchair Distance: 150 Locomotion: Wheelchair: 5: Travels 150 ft or more: maneuvers on rugs and over door sills with supervision, cueing or coaxing FIM - Locomotion: Ambulation Locomotion: Ambulation Assistive Devices: Other (comment) (hall rail on L) Ambulation/Gait Assistance: Not tested (comment) Locomotion: Ambulation: 0: Activity did not occur  Comprehension Comprehension Mode: Auditory Comprehension: 5-Understands  basic 90% of the time/requires cueing < 10% of the  time  Expression Expression Mode: Verbal Expression: 4-Expresses basic 75 - 89% of the time/requires cueing 10 - 24% of the time. Needs helper to occlude trach/needs to repeat words.  Social Interaction Social Interaction: 4-Interacts appropriately 75 - 89% of the time - Needs redirection for appropriate language or to initiate interaction.  Problem Solving Problem Solving: 3-Solves basic 50 - 74% of the time/requires cueing 25 - 49% of the time  Memory Memory: 2-Recognizes or recalls 25 - 49% of the time/requires cueing 51 - 75% of the time  Medical Problem List and Plan:  1. Left thrombotic PLIC. infarct  2. DVT Prophylaxis/Anticoagulation: Subcutaneous Lovenox. Monitor platelet counts any signs of bleeding  3. Pain Management: Hydrocodone as needed. Monitor with increased mobility   -added neurontin for neuropathic pain (home med) which seems to have helped a little  -try voltaren gel for right ankle (?OA) 4. Neuropsych: This patient is not capable of making decisions on his/her own behalf.   -team to provide egosupport. Mood, cognitive eval this week.  -needs a lot of positive reinforcement. Also cognitive-linguistic issues 5. Clostridium difficile. Flagyl 500 mg every 8 hours initiated 05/29/2012  -diarrhea improved 6. Hypertension. Hydrochlorothiazide 12.5 mg daily. Monitor the increased mobility  7. Borderline diabetes mellitus. Latest hemoglobin A1c 5.3. Will change to [facility] diet. Add low dose amaryl for better control 8. Tobacco abuse. NicoDerm patch. Provide counseling.  9. Hyperlipidemia. Zocor 10. Hypokalemia: now hyperkalemic- hold k+ and recheck  -need to encourage po intake however,fluids  LOS (Days) 5 A FACE TO FACE EVALUATION WAS PERFORMED  [facility] T 06/04/2012 8:06 AM

## 2012-06-05 ENCOUNTER — Inpatient Hospital Stay (HOSPITAL_COMMUNITY): Payer: Medicare Other

## 2012-06-05 ENCOUNTER — Inpatient Hospital Stay (HOSPITAL_COMMUNITY): Payer: Medicare Other | Admitting: Physical Therapy

## 2012-06-05 ENCOUNTER — Inpatient Hospital Stay (HOSPITAL_COMMUNITY): Payer: Medicare Other | Admitting: Speech Pathology

## 2012-06-05 ENCOUNTER — Encounter (HOSPITAL_COMMUNITY): Payer: Medicare Other

## 2012-06-05 DIAGNOSIS — I6350 Cerebral infarction due to unspecified occlusion or stenosis of unspecified cerebral artery: Secondary | ICD-10-CM

## 2012-06-05 LAB — BASIC METABOLIC PANEL
BUN: 38 mg/dL — ABNORMAL HIGH (ref 6–23)
CO2: 28 mEq/L (ref 19–32)
Chloride: 96 mEq/L (ref 96–112)
Creatinine, Ser: 1.32 mg/dL (ref 0.50–1.35)

## 2012-06-05 LAB — GLUCOSE, CAPILLARY
Glucose-Capillary: 219 mg/dL — ABNORMAL HIGH (ref 70–99)
Glucose-Capillary: 224 mg/dL — ABNORMAL HIGH (ref 70–99)

## 2012-06-05 MED ORDER — CIPROFLOXACIN HCL 250 MG PO TABS
250.0000 mg | ORAL_TABLET | Freq: Two times a day (BID) | ORAL | Status: AC
  Administered 2012-06-05 – 2012-06-11 (×14): 250 mg via ORAL
  Filled 2012-06-05 (×19): qty 1

## 2012-06-05 MED ORDER — GLIMEPIRIDE 2 MG PO TABS
2.0000 mg | ORAL_TABLET | Freq: Every day | ORAL | Status: DC
  Administered 2012-06-05 – 2012-06-08 (×4): 2 mg via ORAL
  Filled 2012-06-05 (×8): qty 1

## 2012-06-05 NOTE — Progress Notes (Signed)
Speech Language Pathology Daily Session Note  Patient Details  Name: Eric Scott MRN: [id number] Date of Birth: 05-Oct-1950  Today's Date: 06/05/2012 Time: [id number] Time Calculation (min): 40 min  Short Term Goals: Week 1: SLP Short Term Goal 1 (Week 1): Pt. will improve speech intelligibility in challenging communicative environments utilizing strategies with min verbal/visual cues. SLP Short Term Goal 2 (Week 1): Pt. will demonstrate alternating attention in functional activities in moderately distracting environment with min verbal/visual cues. SLP Short Term Goal 3 (Week 1): Pt. will evaluate/self monitor performance during various activities with moderate verbal cues. SLP Short Term Goal 4 (Week 1): Pt. will recall and utilize strategies to assist with working and prospective memory with mod verbal/visual cues.  Skilled Therapeutic Interventions: Treatment focus on cognitive goals. Pt asleep in bed upon arrival to room but was easy to arouse. Pt transferred from bed to wheelchair with Min A but required Min verbal cues for organization with transfer. Pt participated in basic money management task with supervision question cues to self-monitor and correct errors. Pt required Max verbal and question cues to utilize schedule to anticipate next therapy session and to recall morning therapy events. Pt also required Max verbal cues for emergent awareness of deficits and the impact they have on his independence and overall function.  Pt was 100% intelligible at the sentence level.   FIM:  Comprehension Comprehension Mode: Auditory Comprehension: 5-Follows basic conversation/direction: With extra time/assistive device Expression Expression Mode: Verbal Expression: 3-Expresses basic 50 - 74% of the time/requires cueing 25 - 50% of the time. Needs to repeat parts of sentences. Social Interaction Social Interaction: 3-Interacts appropriately 50 - 74% of the time - May be physically or verbally  inappropriate. Problem Solving Problem Solving: 2-Solves basic 25 - 49% of the time - needs direction more than half the time to initiate, plan or complete simple activities Memory Memory: 2-Recognizes or recalls 25 - 49% of the time/requires cueing 51 - 75% of the time FIM - Eating Eating Activity: 5: Set-up assist for open containers  Pain No/Denies Pain  Therapy/Group: Individual Therapy  Eric Scott 06/05/2012, 3:14 PM

## 2012-06-05 NOTE — Progress Notes (Signed)
Physical Therapy Session Note  Patient Details  Name: Eric Scott MRN: [id number] Date of Birth: 23-Oct-1950  Today's Date: 06/05/2012 Time: 1505-1600 Time Calculation (min): 55 min  Short Term Goals: Week 1:  PT Short Term Goal 1 (Week 1): Pt will perform basic transfer with mod assist to L. PT Short Term Goal 2 (Week 1): Pt will propel w/c x 50' with min assist in congested area. PT Short Term Goal 3 (Week 1): Pt will perform gait x 25' with assistance of 1 person. PT Short Term Goal 4 (Week 1): Pt will ascend and descend 5 steps with 2 rails with mod assist. PT Short Term Goal 5 (Week 1): Pt will stand x 5 minutes at table during bil UE activity with mod assist for balance.  Skilled Therapeutic Interventions/Progress Updates:   Patient performed w/c mobility in controlled environment x 150' with min-mod A for attention to RUE placement and propulsion sequence with intermittent rest breaks secondary to fatigue.  Performed gait with EVA walker x 50' + 25' in controlled environment with mod A with verbal cues for upright trunk posture, facilitation of L lateral weight shifting to allow for full step length RLE, and cues for wider R BOS secondary to R lateral lean and to improve lateral hip stability.  Required multiple sitting rest breaks secondary to fatigue and SOB.  Continued with gait training with stair negotiation with two rails up and down 5 stairs with step to sequencing beginning with ascending with LLE and descending with RLE and then switching to ascending with RLE to facilitate L lateral weight shift and extension through RLE.  Performed R toe taps to 4" step with bilat UE support to facilitate L lateral weight shift.  Continued weight shift and lateral hip stability training during R and L lateral stepping with bilat UE support on wall rail x 25' each direction with min-mod A and verbal cues for full R lateral stepping and facilitation of full R and L lateral weight shift.  Returned to  room and transferred to bed with min A squat pivot and sit > supine with min A.    Therapy Documentation Precautions:  Precautions Precautions: Fall Precaution Comments: contact precautions-C.diff Restrictions Weight Bearing Restrictions: No Vital Signs: Therapy Vitals Temp: 97.4 F (36.3 C) Temp src: Oral Pulse Rate: 103  Resp: 18  BP: 126/85 mmHg Patient Position, if appropriate: Lying Oxygen Therapy SpO2: 97 % O2 Device: None (Room air) Pain: Pain Assessment Pain Assessment: No/denies pain  See FIM for current functional status  Therapy/Group: Individual Therapy  Raylene Everts [facility] 06/05/2012, 4:42 PM

## 2012-06-05 NOTE — Progress Notes (Signed)
Patient relates poor appetite. Discussed diabetic diet and need to monitor diet and follow diabetic diet. Teaching done. Ensure given due to no intake of dinner. Tonita Cong, RN

## 2012-06-05 NOTE — Progress Notes (Signed)
Occupational Therapy Session Note  Patient Details  Name: Eric Scott MRN: [id number] Date of Birth: 05-23-1951  Today's Date: 06/05/2012 Time: [id number] Time Calculation (min): 77 min  Short Term Goals: Week 1:  OT Short Term Goal 1 (Week 1): Patient will bathe self with mod assist OT Short Term Goal 2 (Week 1): Patient will transfer to toilet with min assist OT Short Term Goal 3 (Week 1): Patient will transfer to shower bench with min assist OT Short Term Goal 4 (Week 1): Patient will maintain static sitting balance on shower bench with verbal cueing, frequent  Skilled Therapeutic Interventions/Progress Updates:    Patient in bed eating breakfast upon therapy arrival. Patient needed increased time to eat breakfast. Patient required set-up of tray for self-feeding. Session with focus on toileting, toilet transfer, standing balance, ADL performance. Patient needed min vc's to terminate bathing activities as he seemed to brush teeth for 5 minutes and continuously wash his neck over and over again. Transfers have improved since patient was last seen by therapist. Therapist blocked right foot to prevent slipping, but patient transferred from bed to w/c at [place] level. Lateral lean to right is still present but greatly decreased. Patient was taken to nurse's desk per safety plan at end of tx session.  Therapy Documentation Precautions:  Precautions Precautions: Fall Precaution Comments: contact precautions-C.diff Restrictions Weight Bearing Restrictions: No Pain: Pain Assessment Pain Assessment: No/denies pain  See FIM for current functional status  Therapy/Group: Individual Therapy  Ailene Ravel, OTR/L 06/05/2012, 9:56 AM

## 2012-06-05 NOTE — Progress Notes (Signed)
Inpatient Diabetes Program Recommendations  AACE/ADA: New Consensus Statement on Inpatient Glycemic Control (2013)  Target Ranges:  Prepandial:   less than 140 mg/dL      Peak postprandial:   less than 180 mg/dL (1-2 hours)      Critically ill patients:  140 - 180 mg/dL   Reason for Visit: Fasting and post-prandial hyperglycemia Since onset of stroke, pt appears to need basal insulin. Fasting glucose levels are high and therefore high throughout the day. Often the stress of the stroke will make insulin demands higher at least temporarily if not permantly. Inpatient Diabetes Program Recommendations Insulin - Basal: Pt would benefit from some basal insulin while here.  Start with 10-15 units q d  (fasting glucose is high.)  Note: Thank you, Rosita Kea, RN, CNS, Diabetes Coordinator [phone])

## 2012-06-05 NOTE — Progress Notes (Addendum)
Subjective/Complaints: Right ankle still sore. Incontinent of urine  A 12 point review of systems has been performed and if not noted above is otherwise negative.   Objective: Vital Signs: Blood pressure 142/82, pulse 88, temperature 98.3 F (36.8 C), temperature source Oral, resp. rate 18, height 6' (1.829 m), weight 94.9 kg (209 lb 3.5 oz), SpO2 96.00%. No results found.  Basename 06/02/12 1012  WBC 11.2*  HGB 17.0  HCT 49.9  PLT 210    Basename 06/05/12 0630 06/03/12 0702  NA 135 137  K 4.8 5.2*  CL 96 97  GLUCOSE 206* 148*  BUN 38* 33*  CREATININE 1.32 1.24  CALCIUM 9.8 9.7   CBG (last 3)   Basename 06/04/12 2210 06/04/12 1643 06/04/12 1125  GLUCAP 177* 370* 226*    Wt Readings from Last 3 Encounters:  05/30/12 94.9 kg (209 lb 3.5 oz)    Physical Exam:  HENT:  Head: Normocephalic. Oral mucosa pink and moist  Eyes:  Pupils round and reactive to light  Neck: Neck supple. No thyromegaly present. No jvd  Cardiovascular: Normal rate and regular rhythm. No murmur  Pulmonary/Chest: Effort normal and breath sounds normal. He has no wheezes. No distress  Abdominal: Soft. Bowel sounds are normal. There is no tenderness.  Musculoskeletal: He exhibits no edema. Right ankle slightly swollen, cool, no instability Neurological: He is alert.  Patient is a poor historian. He was able to state his appropriate age and date of birth.  He did follow simple commands.  . Affect flat. RUE is 2+ to 3-/5 . RLE is 1+ prox to 3- distally. Probably stocking glove sensory loss in both legs also.Sensation 1/2 Right arm,leg. Did withdraw to pain. Toes non reactive. Infrequently makes eye contact. Right facial droop. Speech sl dysarthric  Psych: affect extremely flat. Appears depressed.  Skin: Skin is warm and dry. Toe wound   Assessment/Plan: 1. Functional deficits secondary to left PLIC into thalamus thrombotic infarct which require 3+ hours per day of interdisciplinary therapy in a  comprehensive inpatient rehab setting. Physiatrist is providing close team supervision and 24 hour management of active medical problems listed below. Physiatrist and rehab team continue to assess barriers to discharge/monitor patient progress toward functional and medical goals. FIM: FIM - Bathing Bathing Steps Patient Completed: Chest;Right Arm;Left Arm;Abdomen;Front perineal area;Right upper leg;Left upper leg Bathing: 3: Mod-Patient completes 5-7 44f 10 parts or 50-74%  FIM - Upper Body Dressing/Undressing Upper body dressing/undressing steps patient completed: Thread/unthread right sleeve of pullover shirt/dresss;Thread/unthread left sleeve of pullover shirt/dress;Put head through opening of pull over shirt/dress;Pull shirt over trunk Upper body dressing/undressing: 5: Supervision: Safety issues/verbal cues FIM - Lower Body Dressing/Undressing Lower body dressing/undressing steps patient completed: Thread/unthread right pants leg;Thread/unthread left pants leg;Pull pants up/down;Don/Doff right sock;Don/Doff left sock Lower body dressing/undressing: 4: Min-Patient completed 75 plus % of tasks  FIM - Toileting Toileting: 0: Activity did not occur  FIM - Air cabin crew Transfers: 5-From toilet/BSC: Supervision (verbal cues/safety issues);2-To toilet/BSC: Max A (lift and lower assist)  FIM - Control and instrumentation engineer Devices: Arm rests Bed/Chair Transfer: 3: Chair or W/C > Bed: Mod A (lift or lower assist)  FIM - Locomotion: Wheelchair Distance: 150 Locomotion: Wheelchair: 5: Travels 150 ft or more: maneuvers on rugs and over door sills with supervision, cueing or coaxing FIM - Locomotion: Ambulation Locomotion: Ambulation Assistive Devices: Other (comment) (hall rail on L) Ambulation/Gait Assistance: Not tested (comment) Locomotion: Ambulation: 0: Activity did not occur  Comprehension Comprehension Mode: Auditory  Comprehension: 5-Understands basic  90% of the time/requires cueing < 10% of the time  Expression Expression Mode: Verbal Expression: 4-Expresses basic 75 - 89% of the time/requires cueing 10 - 24% of the time. Needs helper to occlude trach/needs to repeat words.  Social Interaction Social Interaction: 5-Interacts appropriately 90% of the time - Needs monitoring or encouragement for participation or interaction.  Problem Solving Problem Solving: 2-Solves basic 25 - 49% of the time - needs direction more than half the time to initiate, plan or complete simple activities  Memory Memory: 2-Recognizes or recalls 25 - 49% of the time/requires cueing 51 - 75% of the time  Medical Problem List and Plan:  1. Left thrombotic PLIC. infarct  2. DVT Prophylaxis/Anticoagulation: Subcutaneous Lovenox. Monitor platelet counts any signs of bleeding  3. Pain Management: Hydrocodone as needed. Monitor with increased mobility   -added neurontin for neuropathic pain (home med) which seems to have helped a little  -try voltaren gel for right ankle (?OA) 4. Neuropsych: This patient is not capable of making decisions on his/her own behalf.   -team to provide egosupport. Mood, cognitive eval this week.  -needs a lot of positive reinforcement. Also cognitive-linguistic issues 5. Clostridium difficile. Flagyl 500 mg every 8 hours initiated 05/29/2012  -diarrhea improved 6. Hypertension. Hydrochlorothiazide 12.5 mg daily held. Monitor the increased mobility  7. Borderline diabetes mellitus. Latest hemoglobin A1c 5.3. Will change to [facility] diet. Added low dose amaryl for better control- , rx UTI 8. Tobacco abuse. NicoDerm patch. Provide counseling.  9. Hyperlipidemia. Zocor 10. Hypokalemia/FEN: resume potassium if/when we restart hctz  -encourage fluids  -hctz held 11. UTI- 100k GNR, begin empiric cipro  LOS (Days) 6 A FACE TO FACE EVALUATION WAS PERFORMED  SWARTZ,ZACHARY T 06/05/2012 7:48 AM

## 2012-06-05 NOTE — Progress Notes (Signed)
Physical Therapy Session Note  Patient Details  Name: Eric Scott MRN: [id number] Date of Birth: 1951/05/17  Today's Date: 06/05/2012 Time: 1120-1205 Time Calculation (min): 45 min  Short Term Goals: Week 1:  PT Short Term Goal 1 (Week 1): Pt will perform basic transfer with mod assist to L. PT Short Term Goal 2 (Week 1): Pt will propel w/c x 50' with min assist in congested area. PT Short Term Goal 3 (Week 1): Pt will perform gait x 25' with assistance of 1 person. PT Short Term Goal 4 (Week 1): Pt will ascend and descend 5 steps with 2 rails with mod assist. PT Short Term Goal 5 (Week 1): Pt will stand x 5 minutes at table during bil UE activity with mod assist for balance.    Skilled Therapeutic Interventions/Progress Updates:  W/c mobility using bil hands, x 100' x 2 with supervison for R attention, steering.  neuromuscular re-education via forced use, VCs, demo for: - standing with = wt bearing x 5 minutes during R UE task at high table; L hand supported on table -gait in hall x 25' using L rail, with mod assist for R stance stability, trunk and head extension  PROM bil hamstrings and heel cords; instructed in self ROM using stool and strap  W/c > bed due to fatigue, min assist stand pivot to L.  Call bell and phone within reach; bed alarm activated.     Therapy Documentation Precautions:  Precautions Precautions: Fall Precaution Comments: contact precautions-C.diff Restrictions Weight Bearing Restrictions: No   Vital Signs: Therapy Vitals Pulse Rate: 95  Oxygen Therapy SpO2: 96 % O2 Device: None (Room air) Pain: Pain Assessment Pain Score:   7 Pain Location: Foot (bil; also low back) Pain Intervention(s): Medication (See eMAR)     See FIM for current functional status  Therapy/Group: Individual Therapy  Shalita Notte 06/05/2012, 12:25 PM

## 2012-06-06 ENCOUNTER — Inpatient Hospital Stay (HOSPITAL_COMMUNITY): Payer: Medicare Other

## 2012-06-06 ENCOUNTER — Inpatient Hospital Stay (HOSPITAL_COMMUNITY): Payer: Medicare Other | Admitting: Speech Pathology

## 2012-06-06 DIAGNOSIS — A0472 Enterocolitis due to Clostridium difficile, not specified as recurrent: Secondary | ICD-10-CM

## 2012-06-06 DIAGNOSIS — F329 Major depressive disorder, single episode, unspecified: Secondary | ICD-10-CM

## 2012-06-06 DIAGNOSIS — I6330 Cerebral infarction due to thrombosis of unspecified cerebral artery: Secondary | ICD-10-CM

## 2012-06-06 LAB — URINE CULTURE: Colony Count: >=100000

## 2012-06-06 LAB — BASIC METABOLIC PANEL
Calcium: 9.6 mg/dL (ref 8.4–10.5)
Creatinine, Ser: 1.44 mg/dL — ABNORMAL HIGH (ref 0.50–1.35)
GFR calc non Af Amer: 51 mL/min — ABNORMAL LOW (ref >90)
Glucose, Bld: 197 mg/dL — ABNORMAL HIGH (ref 70–99)
Sodium: 136 mEq/L (ref 135–145)

## 2012-06-06 LAB — GLUCOSE, CAPILLARY
Glucose-Capillary: 201 mg/dL — ABNORMAL HIGH (ref 70–99)
Glucose-Capillary: 98 mg/dL (ref 70–99)
Glucose-Capillary: 129 mg/dL — ABNORMAL HIGH (ref 70–99)

## 2012-06-06 MED ORDER — GLUCERNA SHAKE PO LIQD
237.0000 mL | Freq: Three times a day (TID) | ORAL | Status: DC
  Administered 2012-06-06 – 2012-06-16 (×27): 237 mL via ORAL
  Filled 2012-06-06: qty 237

## 2012-06-06 NOTE — Progress Notes (Signed)
Physical Therapy Weekly Progress Note  Patient Details  Name: Eric Scott MRN: [id number] Date of Birth: 1951-01-20  Today's Date: 06/06/2012 Time: 1430-1530 Time Calculation (min): 60 min  Patient has met 5 of 5 short term goals.  Patient has made good progress.    Patient continues to demonstrate the following deficits: pusher to right, right hemiparesis, decreased safety awareness, dependency with functional mobility and therefore will continue to benefit from skilled PT intervention to enhance overall performance with activity tolerance, balance, postural control, functional use of  right upper extremity and right lower extremity and awareness.  Patient progressing toward [place] term goals..  Continue plan of care. Discharge plan has changed to skilled placement due to lack of caregiver support.   PT Short Term Goals Week 1:  PT Short Term Goal 1 (Week 1): Pt will perform basic transfer with mod assist to L. PT Short Term Goal 1 - Progress (Week 1): Met PT Short Term Goal 2 (Week 1): Pt will propel w/c x 50' with min assist in congested area. PT Short Term Goal 2 - Progress (Week 1): Met PT Short Term Goal 3 (Week 1): Pt will perform gait x 25' with assistance of 1 person. PT Short Term Goal 3 - Progress (Week 1): Met PT Short Term Goal 4 (Week 1): Pt will ascend and descend 5 steps with 2 rails with mod assist. PT Short Term Goal 4 - Progress (Week 1): Met PT Short Term Goal 5 (Week 1): Pt will stand x 5 minutes at table during bil UE activity with mod assist for balance. PT Short Term Goal 5 - Progress (Week 1): Met Week 2:  PT Short Term Goal 1 (Week 2): STG's = LTG's  Skilled Therapeutic Interventions/Progress Updates:  Patient supine to sit with supervision with head of bed raised and bed rails. Patient transferred bed to wheelchair stand turn with rolling walker and moderate assistance. Patient requires cueing to push up and reach back with hands to assist with lifting and  lowering. Patient ambulated up and down 5 steps with bilateral rails and moderate assistance. Patient stood at hi low table and performed UE task with bolts/nuts for 7 1/2 minutes with moderate assistance. Patient began to push to right and required cueing to correct balance. Patient worked on activity tolerance on Nustep for 5 minutes. Patient practiced sit to stand x 5 with supervision to work on hand placement during that activity. Patient ambulated 90 feet with rolling walker and moderate assist. Patient requires facilitation to weight shift to left and verbal cueing to increase step length with right LE. Patient tends to drag right foot forward. Patient propelled wheelchair 125 feet on level tile wit supervision.  Therapy Documentation Precautions:  Precautions Precautions: Fall Precaution Comments: contact precautions-C.diff Restrictions Weight Bearing Restrictions: No  Pain: Pain Assessment Pain Assessment: 0-10 Pain Score:   5 Pain Location: Foot Pain Descriptors: Aching Pain Frequency: Occasional Pain Onset: Gradual Patients Stated Pain Goal: 3 Pain Intervention(s): Medication (See eMAR)  Locomotion : Ambulation Ambulation/Gait Assistance: 3: Mod assist Wheelchair Mobility Distance: 125 feet   See FIM for current functional status  Therapy/Group: Individual Therapy  Sanjuana Letters 06/06/2012, 3:59 PM

## 2012-06-06 NOTE — Progress Notes (Signed)
Social Work Patient ID: Eric Scott, male   DOB: November 03, 1950, 62 y.o.   MRN: [id number] Met with pt with wife-Eric Scott who was here to get pt to sign a check for his rent.  Pt verified who she was and he was wanting Her to pay his rent also spoke with Sunday Spillers from the bank who told wife how to write the check out and pt has had a stroke and his signature is different. Discussed with Santiago Glad if she could provide care to pt at discharge, she reports she has so many of her own health issues she can barely take care of herself. She reports they have been married for 3 years and had a son whom they lost when he was 43 years old.  Other three children are wife's from a first marriage. She is aware of pt needing to go to a SNF upon discharge from hospital and feels this is the best place for him at this time.  Pt was glad to see her and wanted  Her to stay longer.  Work on discharge plans and start looking for SNF.

## 2012-06-06 NOTE — Progress Notes (Signed)
NUTRITION FOLLOW UP  Intervention:   1. D/c Ensure Complete 2. Glucerna Shake po TID, each supplement provides 220 kcal and 10 grams of protein.  3. Recommend frequent weight monitoring to assess adequacy of PO intake  4. RD will continue to follow     Nutrition Dx:   Inadequate oral intake related to poor appetite as evidenced by patient report   Goal:   Oral intake with meals and supplements to meet >/=90% of estimated nutrition needs. Unmet  Monitor:   PO intake, weight trends, labs, I/O's  Assessment:   Notes indicate decreased appetite. Pt reports not eating r/t dislike of the foods provided, does not like the smell.  Does like the Ensure. Continues with elevated blood sugars. Will change Ensure to Glucerna shakes.   Height: Ht Readings from Last 1 Encounters:  05/30/12 6' (1.829 m)    Weight Status:   Wt Readings from Last 1 Encounters:  05/30/12 209 lb 3.5 oz (94.9 kg)  no new weight since admission  Re-estimated needs:  Kcal: 2000-2200 Protein: 100-110 gm  Fluid: 2-2.2 L/day  Skin: laceration of the right 4th toe   Diet Order: Cardiac PO intake: 25-50% most meals  Intake/Output Summary (Last 24 hours) at 06/06/12 1129 Last data filed at 06/06/12 0900  Gross per 24 hour  Intake    480 ml  Output      0 ml  Net    480 ml    Last BM: 1/2   Labs:   Lab 06/06/12 0620 06/05/12 0630 06/03/12 0702  NA 136 135 137  K 4.1 4.8 5.2*  CL 95* 96 97  CO2 32 28 32  BUN 44* 38* 33*  CREATININE 1.44* 1.32 1.24  CALCIUM 9.6 9.8 9.7  MG -- -- --  PHOS -- -- --  GLUCOSE 197* 206* 148*    CBG (last 3)   Basename 06/06/12 0739 06/05/12 2155 06/05/12 1654  GLUCAP 201* 178* 173*    Scheduled Meds:   . aspirin  300 mg Rectal Daily   Or  . aspirin  325 mg Oral Daily  . ciprofloxacin  250 mg Oral BID  . diclofenac sodium  2 g Topical QID  . enoxaparin (LOVENOX) injection  40 mg Subcutaneous Q24H  . feeding supplement  237 mL Oral BID BM  . gabapentin   100 mg Oral TID  . glimepiride  2 mg Oral Q breakfast  . insulin aspart  0-15 Units Subcutaneous TID WC  . insulin aspart  0-5 Units Subcutaneous QHS  . metroNIDAZOLE  500 mg Oral Q8H  . nicotine  21 mg Transdermal Daily  . simvastatin  10 mg Oral q1800    Continuous Infusions:   Orson Slick RD, LDN Pager [phone] After Hours pager [phone]

## 2012-06-06 NOTE — Consult Note (Signed)
NEUROCOGNITIVE STATUS EXAMINATION - [place]   [name] is a 62 year old, right-handed man who was seen for a neurocognitive status examination to assess his emotional state and mental status post-stroke.  According to his medical record, [name] was admitted on 05/27/2012 with right-sided weakness and altered mental status.  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  Additionally, slightly older subacute non-hemorrhagic infarcts were seen in anterior left external capsule, as was a remote lacunar infarct.  His medical history is also significant for diabetes, tobacco abuse, and hypertension.    Emotional Functioning:  During the clinical interview, [name] acknowledged symptoms of depression, chiefly frustration and feeling sad regarding the limited physical progress that he has made.  He commented that his greatest frustration surrounds not being able to walk well, though he also mentioned that he walked 25 feet today.  He also described difficulty asking for help and stated that he was used to being independent and it is challenging for him to rely on others.  He had trouble imagining discharging from the unit and still needing assistance at home.  Despite his low mood, he denied suicidal ideation, plan, or intent.  [name] also denied prior history of depressed mood, though he admitted to longstanding anxiety, for which he had been prescribed Valium.  Behaviorally, [name] kept his eyes closed during much of our conversation.  He apologized, stating that he was quite tired from morning therapies.  It was difficult to discern if the conversation was upsetting to him or if he was in fact, just tired.  When questioned, he denied immediate worsening of depressive symptoms and said that he was simply fatigued.    [name] responses to  self-report measures of mood symptoms were suggestive of the presence of severe anxiety and mild-moderate depression.  Owing to his report of prior treatment of anxiety, it is likely that his anxious mood is secondary to longstanding generalized anxiety.  However, his depressed mood seems to be situational and is likely a temporary adjustment reaction since his stroke.    Mental Status:  [name] total score on an overall measure of mental status was not suggestive of the presence of significant cognitive impairment (MMSE-2 brief = 14/16).  After immediately registering 3 words into memory, he was able to freely recall only 1 of 3 words after a brief delay.    Impressions and Recommendations:  [name] overall neurocognitive functioning appears to be intact.  As mentioned above, his depressed mood seems to be situational and will likely resolve as he recovers physically.  However, if it does not improve, he may consider engaging in individual psychotherapy post-discharge to learn more effective coping strategies to deal with life stressors.  If appropriate, a referral should be made upon discharge for this purpose.  Notably, because his mood seems to be impacted by the rate of physical recovery, he should be well-informed of the time expectations for recovery so that he does not set unrealistic expectations for himself and feel down when he is not completely recovered in a short time.  Owing to history of longstanding anxious mood, his physician may consider evaluating the efficacy of his current medication regimen to see if alterations would be medically indicated to reduce anxiety.  [name] expressed gratitude for the opportunity to talk with the neuropsychologist today and felt as though the session  was helpful in improving mood.  As such, the neuropsychologist will plan on meeting with him again in 1 week to assess for interval change in mood and to further assist with coping as he progresses through  recovery.  In addition to providing an outlet for emotional processing, [name] may benefit from opportunities to engage in enjoyable activities other than watching television.  Specifically, he mentioned that reading hunting magazines would be enjoyable.  If possible, providing him with such magazines may significantly help to reduce depression and stress.    DIAGNOSES: Stroke syndrome Anxiety disorder, NOS Adjustment disorder with depressed mood  Marlane Hatcher, Psy.D.  Clinical Neuropsychologist

## 2012-06-06 NOTE — Progress Notes (Signed)
Physical Therapy Note  Patient Details  Name: Eric Scott MRN: [id number] Date of Birth: July 20, 1950 Today's Date: 06/06/2012  10 - 11:00 60 minutes Individual session Patient reports pain at 5 in feet and low back. Patient says he just received meds for pain.  Patient sit to stand with min assist and ambulated with rolling walker 120 feet with moderate assistance. Patient requires cueing and physical assist to stay within walker. Patient tends to drag right foot and keeps it externally rotated and posterior to base of support during gait. Patient ambulated with rolling walker abound obstacles with moderate assist and cueing for safety and to slow down. Patient sit to supine with supervision. Patient performed active exercise on left LE and active assistive exercise for right LE including ankle pumps, hip abduction/adduction, heel slides, towel squeezes, and internal rotation bilaterally. Patient supine to sit with verbal cueing and supervision. Patient performed stand turn transfer with rolling walker and supervision. Patient required cueing for safety to push up and reach back with hands. Patient propelled wheelchair with bilateral UE's 150 feet with supervision. Patient left at nurses desk.    Elder Love M 06/06/2012, 12:16 PM

## 2012-06-06 NOTE — Progress Notes (Signed)
Speech Language Pathology Daily Session Note & Weekly Progress Note  Patient Details  Name: Eric Scott MRN: [id number] Date of Birth: 05-07-1951  Today's Date: 06/06/2012 Time: [id number] Time Calculation (min): 45 min  Short Term Goals: Week 1: SLP Short Term Goal 1 (Week 1): Pt. will improve speech intelligibility in challenging communicative environments utilizing strategies with min verbal/visual cues. SLP Short Term Goal 1 - Progress (Week 1): Met SLP Short Term Goal 2 (Week 1): Pt. will demonstrate alternating attention in functional activities in moderately distracting environment with min verbal/visual cues. SLP Short Term Goal 2 - Progress (Week 1): Progressing toward goal SLP Short Term Goal 3 (Week 1): Pt. will evaluate/self monitor performance during various activities with moderate verbal cues. SLP Short Term Goal 3 - Progress (Week 1): Progressing toward goal SLP Short Term Goal 4 (Week 1): Pt. will recall and utilize strategies to assist with working and prospective memory with mod verbal/visual cues. SLP Short Term Goal 4 - Progress (Week 1): Progressing toward goal  Skilled Therapeutic Interventions: Skilled treatment focused on addressing cognition during self care tasks.  Patient found with only 5% of breakfast consumed; he reported he did not like it.  As a result, SLP facilitated session with meal ordering task.  Patient required supervision level verbal cues to utilize menu and mod assist cues to select food items that took into consideration his diabetes.  SLP also facilitated session with basic money management with increased wait time and supervision-min assist level verbal cues to self-monitor and correct errors.  Patient exhibited decreased awareness of new deficits during direct questioning for SLP as to why his hands were shaky and uncoordinated.  Patient reported that he could not remember what it was called that resulted in this hospital admission; however, was  able to identify after SLP provide with a choice of 3.  This indicates patient is continuing to exhibit decreased retrieval skills with cognitive-linguistic tasks.     FIM:  Comprehension Comprehension Mode: Auditory Comprehension: 5-Follows basic conversation/direction: With extra time/assistive device Expression Expression Mode: Verbal Expression: 3-Expresses basic 50 - 74% of the time/requires cueing 25 - 50% of the time. Needs to repeat parts of sentences. Social Interaction Social Interaction: 3-Interacts appropriately 50 - 74% of the time - May be physically or verbally inappropriate. Problem Solving Problem Solving: 2-Solves basic 25 - 49% of the time - needs direction more than half the time to initiate, plan or complete simple activities Memory Memory: 2-Recognizes or recalls 25 - 49% of the time/requires cueing 51 - 75% of the time FIM - Eating Eating Activity: 5: Set-up assist for open containers  Pain Pain Assessment Pain Assessment: 0-10 Pain Score:   5 Pain Intervention(s): RN made aware Multiple Pain Sites: No  Therapy/Group: Individual Therapy   Speech Language Pathology Weekly Progress Note  Patient Details  Name: Eric Scott MRN: [id number] Date of Birth: 07-21-1950  Today's Date: 06/06/2012  Short Term Goals: Week 1: SLP Short Term Goal 1 (Week 1): Pt. will improve speech intelligibility in challenging communicative environments utilizing strategies with min verbal/visual cues. SLP Short Term Goal 1 - Progress (Week 1): Met SLP Short Term Goal 2 (Week 1): Pt. will demonstrate alternating attention in functional activities in moderately distracting environment with min verbal/visual cues. SLP Short Term Goal 2 - Progress (Week 1): Progressing toward goal SLP Short Term Goal 3 (Week 1): Pt. will evaluate/self monitor performance during various activities with moderate verbal cues. SLP Short Term Goal 3 -  Progress (Week 1): Progressing toward goal SLP Short  Term Goal 4 (Week 1): Pt. will recall and utilize strategies to assist with working and prospective memory with mod verbal/visual cues. SLP Short Term Goal 4 - Progress (Week 1): Progressing toward goal Week 2: SLP Short Term Goal 1 (Week 2): Patient will improve speech intelligibility in challenging communicative environments utilizing strategies with supervision verbal/visual cues. SLP Short Term Goal 2 (Week 2): Patient will demonstrate alternating attention in functional activities in moderately distracting environment with min verbal/visual cues.  SLP Short Term Goal 3 (Week 2): Patient will evaluate/self monitor performance during various familiar activities with moderate verbal cues. SLP Short Term Goal 4 (Week 2): Patient will recall and utilize strategies to assist with working and prospective memory with mod verbal/visual cues.  Weekly Progress Updates: Patient met 1 out of 4 short term objectives this reporting period due to functional gains in speech intelligibility and use of increased vocal intensity as needed in different environments.  Patient has also made some gains in awareness of deficits as a result of CVA; however, fleeting due to severity of recall/retrieval deficits.  Patient appears to also being solving basic, familiar tasks with less assist; however, poor insight continues to greatly impact his ability to solve basic safety problems without full supervision and as a result, requires close 24/7 supervision to ensure safety with basic tasks.  As a result, it is recommended that this patient continue to receive skilled SLP service to address these deficits with goals for reducing burden of care prior to discharge to SNF.    SLP Intensity: Minumum of 1-2 x/day, 30 to 90 minutes SLP Frequency: 5 out of 7 days SLP Duration/Estimated Length of Stay: 1-2 weeks SLP Treatment/Interventions: Cognitive remediation/compensation;Cueing hierarchy;Environmental controls;Functional  tasks;Internal/external aids;Medication managment;Patient/family education;Speech/Language facilitation;Therapeutic Activities  Carmelia Roller., CCC-SLP [id number]  [place] 06/06/2012, 11:15 AM

## 2012-06-06 NOTE — Progress Notes (Signed)
Subjective/Complaints: Complains of decreased appetite  A 12 point review of systems has been performed and if not noted above is otherwise negative.   Objective: Vital Signs: Blood pressure 136/72, pulse 80, temperature 98.1 F (36.7 C), temperature source Oral, resp. rate 18, height 6' (1.829 m), weight 94.9 kg (209 lb 3.5 oz), SpO2 95.00%. No results found. No results found for this basename: WBC:2,HGB:2,HCT:2,PLT:2 in the last 72 hours  Basename 06/06/12 0620 06/05/12 0630  NA 136 135  K 4.1 4.8  CL 95* 96  GLUCOSE 197* 206*  BUN 44* 38*  CREATININE 1.44* 1.32  CALCIUM 9.6 9.8   CBG (last 3)   Basename 06/05/12 2155 06/05/12 1654 06/05/12 1157  GLUCAP 178* 173* 224*    Wt Readings from Last 3 Encounters:  05/30/12 94.9 kg (209 lb 3.5 oz)    Physical Exam:  HENT:  Head: Normocephalic. Oral mucosa pink and moist  Eyes:  Pupils round and reactive to light  Neck: Neck supple. No thyromegaly present. No jvd  Cardiovascular: Normal rate and regular rhythm. No murmur  Pulmonary/Chest: Effort normal and breath sounds normal. He has no wheezes. No distress  Abdominal: Soft. Bowel sounds are normal. There is no tenderness.  Musculoskeletal: He exhibits no edema. Right ankle slightly swollen, cool, no instability Neurological: He is alert.  Patient is a poor historian. He was able to state his appropriate age and date of birth.  He did follow simple commands.  . Affect flat. RUE is 2+ to 3-/5 . RLE is 1+ prox to 3- distally. Probably stocking glove sensory loss in both legs also.Sensation 1/2 Right arm,leg. Did withdraw to pain. Toes non reactive. Infrequently makes eye contact. Right facial droop. Speech sl dysarthric  Psych: affect extremely flat. Appears depressed.  Skin: Skin is warm and dry. Toe wound   Assessment/Plan: 1. Functional deficits secondary to left PLIC into thalamus thrombotic infarct which require 3+ hours per day of interdisciplinary therapy in a  comprehensive inpatient rehab setting. Physiatrist is providing close team supervision and 24 hour management of active medical problems listed below. Physiatrist and rehab team continue to assess barriers to discharge/monitor patient progress toward functional and medical goals. FIM: FIM - Bathing Bathing Steps Patient Completed: Chest;Right Arm;Left Arm;Abdomen;Front perineal area;Buttocks Bathing: 3: Mod-Patient completes 5-7 43f 10 parts or 50-74%  FIM - Upper Body Dressing/Undressing Upper body dressing/undressing steps patient completed: Thread/unthread right sleeve of pullover shirt/dresss;Thread/unthread left sleeve of pullover shirt/dress;Put head through opening of pull over shirt/dress;Pull shirt over trunk Upper body dressing/undressing: 5: Set-up assist to: Obtain clothing/put away FIM - Lower Body Dressing/Undressing Lower body dressing/undressing steps patient completed: Thread/unthread right underwear leg;Thread/unthread left underwear leg;Pull underwear up/down;Thread/unthread right pants leg;Thread/unthread left pants leg;Pull pants up/down;Don/Doff right sock;Don/Doff left sock Lower body dressing/undressing: 4: Steadying Assist  FIM - Toileting Toileting steps completed by patient: Adjust clothing prior to toileting;Performs perineal hygiene;Adjust clothing after toileting Toileting Assistive Devices: Grab bar or rail for support Toileting: 4: Steadying assist  FIM - Radio producer Devices: Grab bars Toilet Transfers: 4-To toilet/BSC: Min A (steadying Pt. > 75%);4-From toilet/BSC: Min A (steadying Pt. > 75%)  FIM - Bed/Chair Transfer Bed/Chair Transfer Assistive Devices: Bed rails;Arm rests;HOB elevated Bed/Chair Transfer: 4: Bed > Chair or W/C: Min A (steadying Pt. > 75%);4: Chair or W/C > Bed: Min A (steadying Pt. > 75%)  FIM - Locomotion: Wheelchair Distance: 150 Locomotion: Wheelchair: 2: Travels 50 - 149 ft with supervision, cueing or  coaxing FIM -  Locomotion: Ambulation Locomotion: Ambulation Assistive Devices: Other (comment) (L rail in hallway) Ambulation/Gait Assistance: 3: Mod assist Locomotion: Ambulation: 1: Travels less than 50 ft with moderate assistance (Pt: 50 - 74%)  Comprehension Comprehension Mode: Auditory Comprehension: 5-Follows basic conversation/direction: With extra time/assistive device  Expression Expression Mode: Verbal Expression: 3-Expresses basic 50 - 74% of the time/requires cueing 25 - 50% of the time. Needs to repeat parts of sentences.  Social Interaction Social Interaction: 3-Interacts appropriately 50 - 74% of the time - May be physically or verbally inappropriate.  Problem Solving Problem Solving: 2-Solves basic 25 - 49% of the time - needs direction more than half the time to initiate, plan or complete simple activities  Memory Memory: 2-Recognizes or recalls 25 - 49% of the time/requires cueing 51 - 75% of the time  Medical Problem List and Plan:  1. Left thrombotic PLIC. infarct  2. DVT Prophylaxis/Anticoagulation: Subcutaneous Lovenox. Monitor platelet counts any signs of bleeding  3. Pain Management: Hydrocodone as needed. Monitor with increased mobility   -added neurontin for neuropathic pain (home med) which seems to have helped a little  - voltaren gel for right ankle (?OA) 4. Neuropsych: This patient is not capable of making decisions on his/her own behalf.   -team to provide egosupport. Mood, cognitive eval this week.  -needs a lot of positive reinforcement. Also cognitive-linguistic issues 5. Clostridium difficile. Flagyl 500 mg every 8 hours initiated 05/29/2012  -diarrhea improved--continue flagyl for 10 days total 6. Hypertension. Hydrochlorothiazide 12.5 mg daily held. Monitor the increased mobility  7. Borderline diabetes mellitus. Latest hemoglobin A1c 5.3. Will change to [facility] diet. Added low dose amaryl for better control- , rx UTI 8. Tobacco abuse. NicoDerm  patch. Provide counseling.  9. Hyperlipidemia. Zocor 10. Hypokalemia/FEN: resume potassium if/when we restart hctz  -encourage fluids  -hctz held  - add megace to help appetite 11. UTI- morganella and enterococcus, morg sens to cipro, enterococcus pending-----day 2 cipro today  LOS (Days) 7 A FACE TO FACE EVALUATION WAS PERFORMED  SWARTZ,ZACHARY T 06/06/2012 7:34 AM

## 2012-06-06 NOTE — Progress Notes (Signed)
Occupational Therapy Session Note  Patient Details  Name: JALYNN HUTZELL MRN: [id number] Date of Birth: August 04, 1950  Today's Date: 06/06/2012 Time: [id number] Time Calculation (min): 77 min  Short Term Goals: Week 1:  OT Short Term Goal 1 (Week 1): Patient will bathe self with mod assist OT Short Term Goal 2 (Week 1): Patient will transfer to toilet with min assist OT Short Term Goal 3 (Week 1): Patient will transfer to shower bench with min assist OT Short Term Goal 4 (Week 1): Patient will maintain static sitting balance on shower bench with verbal cueing, frequent  Skilled Therapeutic Interventions/Progress Updates:    ADL re-training completed in shower this AM. Session with focus on standing balance, ADL performance, and functional transfers. Pt did not require vc's for thoroughness or initiation of tasks. vc's for safety and hand placement during sit to stands and functional transfers. Patient did need increased time to complete tasks and fatigued frequently. Patient became upset when hearing that he had to go to the nurse's desk when he is not in therapy. Since 12/31 when patient got up by himself, his safety plan has stated that he needs to be at the nurse's desk when not in therapy; let nursing know that patient was upset and would like to talk to someone about the situation. Patient was left at nurse's desk eating breakfast with nursing present.   Therapy Documentation Precautions:  Precautions Precautions: Fall Precaution Comments: contact precautions-C.diff Restrictions Weight Bearing Restrictions: No Pain: Pain Assessment Pain Assessment: No/denies pain  See FIM for current functional status  Therapy/Group: Individual Therapy  Ailene Ravel, OTR/L 06/06/2012, 9:10 AM

## 2012-06-06 NOTE — Progress Notes (Signed)
Occupational Therapy Weekly Progress Note  Patient Details  Name: MCKINSEY DIKES MRN: [id number] Date of Birth: 25-Feb-1951  Today's Date: 06/06/2012  Patient has met 4 of 4 short term goals.    Patient continues to demonstrate the following deficits: standing balance, ADL performance, gross and fine motor coordination, and functional transfers and therefore will continue to benefit from skilled OT intervention to enhance overall performance with BADL.  Patient progressing toward [place] term goals..  Continue plan of care.  OT Short Term Goals Week 1:  OT Short Term Goal 1 (Week 1): Patient will bathe self with mod assist OT Short Term Goal 1 - Progress (Week 1): Met OT Short Term Goal 2 (Week 1): Patient will transfer to toilet with min assist OT Short Term Goal 2 - Progress (Week 1): Met OT Short Term Goal 3 (Week 1): Patient will transfer to shower bench with min assist OT Short Term Goal 3 - Progress (Week 1): Met OT Short Term Goal 4 (Week 1): Patient will maintain static sitting balance on shower bench with verbal cueing, frequent OT Short Term Goal 4 - Progress (Week 1): Met Week 2:  OT Short Term Goal 1 (Week 2): STG = LTG  Skilled Therapeutic Interventions/Progress Updates:    Patient is making steady progress towards LTGs. Patient had one fall when getting up without assistance on 12/31. See today's note for additional information on patient's progress.  Therapy Documentation Precautions:  Precautions Precautions: Fall Precaution Comments: contact precautions-C.diff Restrictions Weight Bearing Restrictions: No Pain: Pain Assessment Pain Assessment: No/denies pain  See FIM for current functional status  Therapy/Group: Individual Therapy  Ailene Ravel, OTR/L 06/06/2012, 9:13 AM

## 2012-06-07 ENCOUNTER — Inpatient Hospital Stay (HOSPITAL_COMMUNITY): Payer: Medicare Other | Admitting: Physical Therapy

## 2012-06-07 LAB — GLUCOSE, CAPILLARY
Glucose-Capillary: 166 mg/dL — ABNORMAL HIGH (ref 70–99)
Glucose-Capillary: 109 mg/dL — ABNORMAL HIGH (ref 70–99)

## 2012-06-07 NOTE — Progress Notes (Signed)
Subjective/Complaints:  No new complaints today. Slept well  A 12 point review of systems has been performed and if not noted above is otherwise negative.   Objective: Vital Signs: Blood pressure 116/75, pulse 76, temperature 97.6 F (36.4 C), temperature source Oral, resp. rate 19, height 6' (1.829 m), weight 209 lb 3.5 oz (94.9 kg), SpO2 94.00%. No results found. No results found for this basename: WBC:2,HGB:2,HCT:2,PLT:2 in the last 72 hours  Basename 06/06/12 0620 06/05/12 0630  NA 136 135  K 4.1 4.8  CL 95* 96  GLUCOSE 197* 206*  BUN 44* 38*  CREATININE 1.44* 1.32  CALCIUM 9.6 9.8   CBG (last 3)   Basename 06/07/12 0724 06/06/12 2056 06/06/12 1655  GLUCAP 166* 129* 98    Wt Readings from Last 3 Encounters:  05/30/12 209 lb 3.5 oz (94.9 kg)    Physical Exam:  NAD HENT: moist Head: Normocephalic. Oral mucosa pink and moist  Eyes:  Pupils round and reactive to light  Neck: Neck supple. No thyromegaly present. No jvd  Cardiovascular: Normal rate and regular rhythm. No murmur  Pulmonary/Chest: Effort normal and breath sounds normal. He has no wheezes. No distress  Abdominal: Soft. Bowel sounds are normal. There is no tenderness.  Musculoskeletal: He exhibits no edema. Right ankle slightly swollen, cool, no instability Neurological: He is alert.  Patient is a poor historian. He was able to state his appropriate age and date of birth.  He did follow simple commands.  . Affect flat. RUE is 2+ to 3-/5 . RLE is 1+ prox to 3- distally. Probably stocking glove sensory loss in both legs also.Sensation 1/2 Right arm,leg. Did withdraw to pain. Toes non reactive. Infrequently makes eye contact. Right facial droop. Speech sl dysarthric  Psych: affect extremely flat. Appears depressed.  Skin: Skin is warm and dry. Toe wound   Assessment/Plan: 1. Functional deficits secondary to left PLIC into thalamus thrombotic infarct which require 3+ hours per day of interdisciplinary therapy  in a comprehensive inpatient rehab setting. Physiatrist is providing close team supervision and 24 hour management of active medical problems listed below. Physiatrist and rehab team continue to assess barriers to discharge/monitor patient progress toward functional and medical goals. FIM: FIM - Bathing Bathing Steps Patient Completed: Chest;Right Arm;Left Arm;Abdomen;Front perineal area;Buttocks;Right upper leg;Left upper leg;Right lower leg (including foot);Left lower leg (including foot) Bathing: 4: Steadying assist  FIM - Upper Body Dressing/Undressing Upper body dressing/undressing steps patient completed: Thread/unthread right sleeve of pullover shirt/dresss;Thread/unthread left sleeve of pullover shirt/dress;Put head through opening of pull over shirt/dress;Pull shirt over trunk Upper body dressing/undressing: 5: Supervision: Safety issues/verbal cues FIM - Lower Body Dressing/Undressing Lower body dressing/undressing steps patient completed: Thread/unthread right underwear leg;Thread/unthread left underwear leg;Pull underwear up/down;Thread/unthread right pants leg;Thread/unthread left pants leg;Pull pants up/down;Don/Doff right sock;Don/Doff left sock Lower body dressing/undressing: 4: Steadying Assist  FIM - Toileting Toileting steps completed by patient: Adjust clothing prior to toileting;Performs perineal hygiene;Adjust clothing after toileting Toileting Assistive Devices: Grab bar or rail for support Toileting: 0: Activity did not occur  FIM - Radio producer Devices: Grab bars Toilet Transfers: 0-Activity did not occur  FIM - Control and instrumentation engineer Devices: Bed rails;Arm rests;HOB elevated Bed/Chair Transfer: 5: Supine > Sit: Supervision (verbal cues/safety issues);3: Bed > Chair or W/C: Mod A (lift or lower assist)  FIM - Locomotion: Wheelchair Distance: 125 feet Locomotion: Wheelchair: 2: Travels 50 - 149 ft with  supervision, cueing or coaxing FIM - Locomotion: Ambulation Locomotion: Ambulation  Assistive Devices: Administrator Ambulation/Gait Assistance: 3: Mod assist Locomotion: Ambulation: 2: Travels 50 - 149 ft with moderate assistance (Pt: 50 - 74%)  Comprehension Comprehension Mode: Auditory Comprehension: 5-Follows basic conversation/direction: With no assist  Expression Expression Mode: Verbal Expression: 3-Expresses basic 50 - 74% of the time/requires cueing 25 - 50% of the time. Needs to repeat parts of sentences.  Social Interaction Social Interaction: 3-Interacts appropriately 50 - 74% of the time - May be physically or verbally inappropriate.  Problem Solving Problem Solving: 2-Solves basic 25 - 49% of the time - needs direction more than half the time to initiate, plan or complete simple activities  Memory Memory: 2-Recognizes or recalls 25 - 49% of the time/requires cueing 51 - 75% of the time  Medical Problem List and Plan:  1. Left thrombotic PLIC. infarct  2. DVT Prophylaxis/Anticoagulation: Subcutaneous Lovenox. Monitor platelet counts any signs of bleeding  3. Pain Management: Hydrocodone as needed. Monitor with increased mobility   -added neurontin for neuropathic pain (home med) which seems to have helped a little  - voltaren gel for right ankle (?OA) 4. Neuropsych: This patient is not capable of making decisions on his/her own behalf.   -team to provide egosupport. Mood, cognitive eval this week.  -needs a lot of positive reinforcement. Also cognitive-linguistic issues 5. Clostridium difficile. Flagyl 500 mg every 8 hours initiated 05/29/2012  -diarrhea improved--continue flagyl for 10 days total 6. Hypertension. Hydrochlorothiazide 12.5 mg daily held. Monitor the increased mobility.  Cont Rx 7. Borderline diabetes mellitus. Latest hemoglobin A1c 5.3. Will change to [facility] diet. Added low dose amaryl for better control- , rx UTI 8. Tobacco abuse. NicoDerm patch.  Provide counseling.  9. Hyperlipidemia. Zocor 10. Hypokalemia/FEN: resume potassium if/when we restart hctz  -encourage fluids  -hctz held  - add megace to help appetite 11. UTI- morganella and enterococcus, morg sens to cipro, enterococcus pending-----day 2 cipro today  LOS (Days) 8 A FACE TO FACE EVALUATION WAS PERFORMED  Walker Kehr 06/07/2012 8:19 AM

## 2012-06-07 NOTE — Progress Notes (Signed)
Occupational Therapy Note  Patient Details  Name: Eric Scott MRN: [id number] Date of Birth: 01-04-1951 Today's Date: 06/07/2012  Time:  1500-1600  (60 min) Group therapy Pain:  Both legs, back   7/10.  SEE MAR  Engaged in right Upper extremity neuromuscular education using right UE to manipulate and left UE to hold.  Pt. Needed miniimal assist sorting cards and seeing matches in his hand.  He had a match at one point and did not realize it.  Needed cues for initiation to take turn and ask appropriate questions.   Seemed to enjoy the group.  Lisa Roca 06/07/2012, 3:55 PM

## 2012-06-08 ENCOUNTER — Inpatient Hospital Stay (HOSPITAL_COMMUNITY): Payer: Medicare Other | Admitting: *Deleted

## 2012-06-08 LAB — GLUCOSE, CAPILLARY
Glucose-Capillary: 163 mg/dL — ABNORMAL HIGH (ref 70–99)
Glucose-Capillary: 93 mg/dL (ref 70–99)

## 2012-06-08 NOTE — Progress Notes (Signed)
Subjective/Complaints:  No new complaints today. Slept well  A 12 point review of systems has been performed and if not noted above is otherwise negative.   Objective: Vital Signs: Blood pressure 107/74, pulse 80, temperature 97.7 F (36.5 C), temperature source Oral, resp. rate 20, height 6' (1.829 m), weight 209 lb 3.5 oz (94.9 kg), SpO2 94.00%. No results found. No results found for this basename: WBC:2,HGB:2,HCT:2,PLT:2 in the last 72 hours  Basename 06/06/12 0620  NA 136  K 4.1  CL 95*  GLUCOSE 197*  BUN 44*  CREATININE 1.44*  CALCIUM 9.6   CBG (last 3)   Basename 06/08/12 0729 06/07/12 2002 06/07/12 1615  GLUCAP 152* 259* 109*    Wt Readings from Last 3 Encounters:  05/30/12 209 lb 3.5 oz (94.9 kg)    Physical Exam:  NAD HENT: moist Head: Normocephalic. Oral mucosa pink and moist  Eyes:  Pupils round and reactive to light  Neck: Neck supple. No thyromegaly present. No jvd  Cardiovascular: Normal rate and regular rhythm. No murmur  Pulmonary/Chest: Effort normal and breath sounds normal. He has no wheezes. No distress  Abdominal: Soft. Bowel sounds are normal. There is no tenderness.  Musculoskeletal: He exhibits no edema. Right ankle slightly swollen, cool, no instability Neurological: He is alert.  Patient is a poor historian. He was able to state his appropriate age and date of birth.  He did follow simple commands.  . Affect flat. RUE is 2+ to 3-/5 . RLE is 1+ prox to 3- distally. Probably stocking glove sensory loss in both legs also.Sensation 1/2 Right arm,leg. Did withdraw to pain. Toes non reactive. Infrequently makes eye contact. Right facial droop. Speech sl dysarthric  Psych: affect extremely flat. Appears depressed.  Skin: Skin is warm and dry. Toe wound - no change   Assessment/Plan: 1. Functional deficits secondary to left PLIC into thalamus thrombotic infarct which require 3+ hours per day of interdisciplinary therapy in a comprehensive inpatient  rehab setting. Physiatrist is providing close team supervision and 24 hour management of active medical problems listed below. Physiatrist and rehab team continue to assess barriers to discharge/monitor patient progress toward functional and medical goals. FIM: FIM - Bathing Bathing Steps Patient Completed: Chest;Right Arm;Left Arm;Abdomen;Front perineal area;Buttocks;Right upper leg;Left upper leg;Right lower leg (including foot);Left lower leg (including foot) Bathing: 4: Steadying assist  FIM - Upper Body Dressing/Undressing Upper body dressing/undressing steps patient completed: Thread/unthread right sleeve of pullover shirt/dresss;Thread/unthread left sleeve of pullover shirt/dress;Put head through opening of pull over shirt/dress;Pull shirt over trunk Upper body dressing/undressing: 5: Supervision: Safety issues/verbal cues FIM - Lower Body Dressing/Undressing Lower body dressing/undressing steps patient completed: Thread/unthread right underwear leg;Thread/unthread left underwear leg;Pull underwear up/down;Thread/unthread right pants leg;Thread/unthread left pants leg;Pull pants up/down;Don/Doff right sock;Don/Doff left sock Lower body dressing/undressing: 4: Steadying Assist  FIM - Toileting Toileting steps completed by patient: Adjust clothing prior to toileting;Performs perineal hygiene;Adjust clothing after toileting Toileting Assistive Devices: Grab bar or rail for support Toileting: 0: Activity did not occur  FIM - Radio producer Devices: Grab bars Toilet Transfers: 0-Activity did not occur  FIM - Control and instrumentation engineer Devices: Bed rails;Arm rests;HOB elevated Bed/Chair Transfer: 4: Bed > Chair or W/C: Min A (steadying Pt. > 75%);4: Chair or W/C > Bed: Min A (steadying Pt. > 75%)  FIM - Locomotion: Wheelchair Distance: 125 feet Locomotion: Wheelchair: 2: Travels 50 - 149 ft with supervision, cueing or coaxing FIM -  Locomotion: Ambulation Locomotion: Ambulation Assistive Devices:  Eric - Rolling Ambulation/Gait Assistance: 3: Mod assist Locomotion: Ambulation: 2: Travels 50 - 149 ft with moderate assistance (Pt: 50 - 74%)  Comprehension Comprehension Mode: Auditory Comprehension: 5-Follows basic conversation/direction: With no assist  Expression Expression Mode: Verbal Expression: 3-Expresses basic 50 - 74% of the time/requires cueing 25 - 50% of the time. Needs to repeat parts of sentences.  Social Interaction Social Interaction: 3-Interacts appropriately 50 - 74% of the time - May be physically or verbally inappropriate.  Problem Solving Problem Solving: 2-Solves basic 25 - 49% of the time - needs direction more than half the time to initiate, plan or complete simple activities  Memory Memory: 2-Recognizes or recalls 25 - 49% of the time/requires cueing 51 - 75% of the time  Medical Problem List and Plan:  1. Left thrombotic PLIC. infarct  2. DVT Prophylaxis/Anticoagulation: Subcutaneous Lovenox. Monitor platelet counts any signs of bleeding  3. Pain Management: Hydrocodone as needed. Monitor with increased mobility   -added neurontin for neuropathic pain (home med) which seems to have helped a little  - voltaren gel for right ankle (?OA) 4. Neuropsych: This patient is not capable of making decisions on his/her own behalf.   -team to provide egosupport. Mood, cognitive eval this week.  -needs a lot of positive reinforcement. Also cognitive-linguistic issues 5. Clostridium difficile. Flagyl 500 mg every 8 hours initiated 05/29/2012  -diarrhea improved--continue flagyl for 10 days total 6. Hypertension. Hydrochlorothiazide 12.5 mg daily held. Monitor the increased mobility.  Cont Rx 7. Borderline diabetes mellitus. Latest hemoglobin A1c 5.3. Will change to [facility] diet. Added low dose amaryl for better control- , rx UTI 8. Tobacco abuse. NicoDerm patch. Provide counseling.  9. Hyperlipidemia.  Zocor 10. Hypokalemia/FEN: resume potassium if/when we restart hctz  -encourage fluids  -hctz held  - add megace to help appetite 11. UTI- morganella and enterococcus, morg sens to cipro, enterococcus pending----- on cipro  LOS (Days) 9 A FACE TO FACE EVALUATION WAS PERFORMED  Eric Scott 06/08/2012 8:49 AM

## 2012-06-08 NOTE — Progress Notes (Signed)
Incontinent of urine this am, requiring total assist with hygiene and linen change. Eric Scott A

## 2012-06-08 NOTE — Progress Notes (Signed)
Physical Therapy Note  Patient Details  Name: Eric Scott MRN: [id number] Date of Birth: [birth year] Today's Date: 06/08/2012 Time in ;1008 Time out:1050 27 min,Individual session Pain reported in B Feet 7/10  TX consisted of: -sit to stand exercises ,pushing up from armrest of w/c 5x, with Supervision, increased time between repetitions. -Transfer training w/c<=> mat minA and VC for R foot clearance  -Dynamic balance in sitting including crossing mid-line and placing and releasing objects, reaching up , Patient leans to the right and needs to use his R elbow to push back up to straight sitting. -Standing balance training with holding to RW with one hand at a time,weight shifts R and L,slight LOB when holding with R hand-minA required, VC for appropriate weight distribution -Stairs -6 steps with minA -Gait training with minA 150 feet x2  -   [name] Spanish 06/08/2012, 11:47 AM

## 2012-06-09 ENCOUNTER — Inpatient Hospital Stay (HOSPITAL_COMMUNITY): Payer: Medicare Other

## 2012-06-09 ENCOUNTER — Inpatient Hospital Stay (HOSPITAL_COMMUNITY): Payer: Medicare Other | Admitting: Speech Pathology

## 2012-06-09 DIAGNOSIS — I6330 Cerebral infarction due to thrombosis of unspecified cerebral artery: Secondary | ICD-10-CM

## 2012-06-09 DIAGNOSIS — F329 Major depressive disorder, single episode, unspecified: Secondary | ICD-10-CM

## 2012-06-09 DIAGNOSIS — A0472 Enterocolitis due to Clostridium difficile, not specified as recurrent: Secondary | ICD-10-CM

## 2012-06-09 LAB — GLUCOSE, CAPILLARY
Glucose-Capillary: 176 mg/dL — ABNORMAL HIGH (ref 70–99)
Glucose-Capillary: 139 mg/dL — ABNORMAL HIGH (ref 70–99)

## 2012-06-09 MED ORDER — GLIMEPIRIDE 4 MG PO TABS
4.0000 mg | ORAL_TABLET | Freq: Every day | ORAL | Status: DC
  Administered 2012-06-09 – 2012-06-16 (×8): 4 mg via ORAL
  Filled 2012-06-09 (×9): qty 1

## 2012-06-09 NOTE — Progress Notes (Signed)
Subjective/Complaints:  eating better. Urinary continence better. Denies pain  A 12 point review of systems has been performed and if not noted above is otherwise negative.   Objective: Vital Signs: Blood pressure 124/77, pulse 77, temperature 97.5 F (36.4 C), temperature source Oral, resp. rate 20, height 6' (1.829 m), weight 94.9 kg (209 lb 3.5 oz), SpO2 96.00%. No results found. No results found for this basename: WBC:2,HGB:2,HCT:2,PLT:2 in the last 72 hours No results found for this basename: NA:2,K:2,CL:2,CO:2,GLUCOSE:2,BUN:2,CREATININE:2,CALCIUM:2 in the last 72 hours CBG (last 3)   Basename 06/08/12 2031 06/08/12 1657 06/08/12 1152  GLUCAP 162* 93 163*    Wt Readings from Last 3 Encounters:  05/30/12 94.9 kg (209 lb 3.5 oz)    Physical Exam:  HENT:  Head: Normocephalic. Oral mucosa pink and moist  Eyes:  Pupils round and reactive to light  Neck: Neck supple. No thyromegaly present. No jvd  Cardiovascular: Normal rate and regular rhythm. No murmur  Pulmonary/Chest: Effort normal and breath sounds normal. He has no wheezes. No distress  Abdominal: Soft. Bowel sounds are normal. There is no tenderness.  Musculoskeletal: He exhibits no edema. Right ankle slightly swollen, cool, no instability Neurological: He is alert.  Patient is a poor historian. He was able to state his appropriate age and date of birth.  He did follow simple commands.  . Affect flat. RUE is   3-/5 . RLE is 1+/2 prox to 3 distally.   stocking glove sensory loss in both legs also.Sensation 1/2 Right arm,leg. Did withdraw to pain. Toes non reactive. Infrequently makes eye contact. Right facial droop. Speech sl dysarthric  Psych: affect extremely flat. Appears depressed at times.  Skin: Skin is warm and dry. Toe wound   Assessment/Plan: 1. Functional deficits secondary to left PLIC into thalamus thrombotic infarct which require 3+ hours per day of interdisciplinary therapy in a comprehensive inpatient  rehab setting. Physiatrist is providing close team supervision and 24 hour management of active medical problems listed below. Physiatrist and rehab team continue to assess barriers to discharge/monitor patient progress toward functional and medical goals. FIM: FIM - Bathing Bathing Steps Patient Completed: Chest;Right Arm;Left Arm;Abdomen;Front perineal area;Buttocks;Right upper leg;Left upper leg;Right lower leg (including foot);Left lower leg (including foot) Bathing: 4: Steadying assist  FIM - Upper Body Dressing/Undressing Upper body dressing/undressing steps patient completed: Thread/unthread right sleeve of pullover shirt/dresss;Thread/unthread left sleeve of pullover shirt/dress;Put head through opening of pull over shirt/dress;Pull shirt over trunk Upper body dressing/undressing: 5: Supervision: Safety issues/verbal cues FIM - Lower Body Dressing/Undressing Lower body dressing/undressing steps patient completed: Thread/unthread right underwear leg;Thread/unthread left underwear leg;Pull underwear up/down;Thread/unthread right pants leg;Thread/unthread left pants leg;Pull pants up/down;Don/Doff right sock;Don/Doff left sock Lower body dressing/undressing: 4: Steadying Assist  FIM - Toileting Toileting steps completed by patient: Adjust clothing prior to toileting;Performs perineal hygiene;Adjust clothing after toileting Toileting Assistive Devices: Grab bar or rail for support Toileting: 0: Activity did not occur  FIM - Radio producer Devices: Grab bars Toilet Transfers: 0-Activity did not occur  FIM - Control and instrumentation engineer Devices: Copy: 4: Bed > Chair or W/C: Min A (steadying Pt. > 75%);4: Chair or W/C > Bed: Min A (steadying Pt. > 75%)  FIM - Locomotion: Wheelchair Distance: 125 feet Locomotion: Wheelchair: 2: Travels 50 - 149 ft with supervision, cueing or coaxing FIM - Locomotion:  Ambulation Locomotion: Ambulation Assistive Devices: Administrator Ambulation/Gait Assistance: 4: Min guard Locomotion: Ambulation: 4: Travels 150 ft or more with minimal  assistance (Pt.>75%)  Comprehension Comprehension Mode: Auditory Comprehension: 5-Follows basic conversation/direction: With no assist  Expression Expression Mode: Verbal Expression: 3-Expresses basic 50 - 74% of the time/requires cueing 25 - 50% of the time. Needs to repeat parts of sentences.  Social Interaction Social Interaction: 3-Interacts appropriately 50 - 74% of the time - May be physically or verbally inappropriate.  Problem Solving Problem Solving: 2-Solves basic 25 - 49% of the time - needs direction more than half the time to initiate, plan or complete simple activities  Memory Memory: 2-Recognizes or recalls 25 - 49% of the time/requires cueing 51 - 75% of the time  Medical Problem List and Plan:  1. Left thrombotic PLIC. infarct  2. DVT Prophylaxis/Anticoagulation: Subcutaneous Lovenox. Monitor platelet counts any signs of bleeding  3. Pain Management: Hydrocodone as needed. Monitor with increased mobility   -added neurontin for neuropathic pain (home med) which seems to have helped a little  - voltaren gel for right ankle (?OA) 4. Neuropsych: This patient is not capable of making decisions on his/her own behalf.   -team to provide egosupport. Mood, cognitive eval this week.  -needs a lot of positive reinforcement. Also cognitive-linguistic issues 5. Clostridium difficile. Flagyl 500 mg every 8 hours initiated 05/29/2012  -diarrhea improved--continue flagyl for 10 days total 6. Hypertension. Hydrochlorothiazide 12.5 mg daily held. Monitor the increased mobility  7. Borderline diabetes mellitus. Latest hemoglobin A1c 5.3. Will change to [facility] diet. Added low dose amaryl for better contro--titrate furtherl- , rx UTI 8. Tobacco abuse. NicoDerm patch. Provide counseling.  9. Hyperlipidemia. Zocor 10.  Hypokalemia/FEN: resume potassium if/when we restart hctz  -encourage fluids  -hctz held  - added megace which has helped appetite  -recheck bmet tomorrow 11. UTI- morganella and enterococcus,   sens to cipro  LOS (Days) 10 A FACE TO FACE EVALUATION WAS PERFORMED  SWARTZ,ZACHARY T 06/09/2012 7:22 AM

## 2012-06-09 NOTE — Progress Notes (Signed)
Physical Therapy Session Note  Patient Details  Name: Eric Scott MRN: [id number] Date of Birth: [birth year]  Today's Date: 06/09/2012 Time: [id number] Time Calculation (min): 50 min  Short Term Goals:= LTGs  Skilled Therapeutic Interventions/Progress Updates:  Bed> w/c transfer to R with min assist for stand pivot.  W/c > mat stand pivot transfer to R as above.  neuromuscular re-education via tactile cues, VCs, forced use for: -Repetitive sit>< stand without use or UEs, facilitating anterior pelvic shift and anterior wt shift, eccentric control for descent --Rolling L with cues for set-up and bringing R arm across body -L sidelying> sitting with VCS as above -Bilateral bridging in supine with football between knees to facilitate R hip activation, working on concentric and eccentric control; bridging to move hips to  L,R, up, and down to initiate scooting in bed  -Gait with RW x 80', x 2 with min/mod assist for steadying RW, cues for upright posture, forward gaze, = step lengths  W/c mobility using bil hands x 163' with cues for R attention and safety R hand against obstacles.     Therapy Documentation Precautions:  Precautions Precautions: Fall Precaution Comments: contact precautions-C.diff  Restrictions Weight Bearing Restrictions: No Pain: Pain Assessment Pain Score:   5 Pain Type: Chronic pain Pain Location: Foot (bil feet and LB) Pain Intervention(s): Medication (See eMAR)    See FIM for current functional status  Therapy/Group: Individual Therapy  Sheria Rosello 06/09/2012, 3:03 PM

## 2012-06-09 NOTE — Progress Notes (Signed)
Occupational Therapy Session Note  Patient Details  Name: Eric Scott MRN: [id number] Date of Birth: 11-27-1950  Today's Date: 06/09/2012 Time: [id number] [phone]) Time Calculation (min): 75 min (30 min)  Short Term Goals: Week 2:  OT Short Term Goal 1 (Week 2): STG = LTG  Skilled Therapeutic Interventions/Progress Updates:    Session 1: ADL re-training completed at sink this AM. Patient eating breakfast upon therapy arrival. Once finished, patient transferred to w/c with Min guard. Patient did not require vc's for hand placement or technique. Patient bathed and washed with supervision. No LOB. Patient did report SOB at breath with activity. Noticed blood on washcloth when cleaning buttocks region. Nurse came in to look at buttocks. Pt has a small open area on butt crack region. Patient also has a rash on right butt cheek. Session with focus on standing balance, ADL performance, functional transfers, and safety awareness.   Session 2: Patient participated in standing balance activity with card matching board at table top. Activity with focus on balance requiring patient to at reach out of base of support. Pt also work on gross and fine motor coordination of R UE to hold and flip card over from deck. Min Guard to Supervision needed for balance. Pt required 2 seated rest breaks. 1st trial: 9'42". 2nd trial: 5' Patient requested to transfer to bed and rest after session. Bed alarm on.  Therapy Documentation Precautions:  Precautions Precautions: Fall Precaution Comments: contact precautions-C.diff Restrictions Weight Bearing Restrictions: No Pain: Pain Assessment Pain Assessment: 0-10 Pain Score:   7 Pain Type: Chronic pain Pain Location: Back Pain Orientation: Lower Pain Descriptors: Aching Pain Frequency: Intermittent Pain Intervention(s): Other (Comment) (medicated PTA) 2nd Pain Site Pain Score: 7 Pain Type: Chronic pain Pain Location: Foot Pain Orientation:  Right;Left Pain Descriptors: Aching Pain Frequency: Intermittent Pain Intervention(s): Other (Comment) (medicated PTA)  See FIM for current functional status  Therapy/Group: Individual Therapy  Ailene Ravel, OTR/L 06/09/2012, 8:33 AM

## 2012-06-09 NOTE — Progress Notes (Signed)
Speech Language Pathology Daily Session Note  Patient Details  Name: Eric Scott MRN: [id number] Date of Birth: 1950-08-12  Today's Date: 06/09/2012 Time: [id number] Time Calculation (min): 45 min  Short Term Goals: Week 2: SLP Short Term Goal 1 (Week 2): Patient will improve speech intelligibility in challenging communicative environments utilizing strategies with supervision verbal/visual cues. SLP Short Term Goal 2 (Week 2): Patient will demonstrate alternating attention in functional activities in moderately distracting environment with min verbal/visual cues.  SLP Short Term Goal 3 (Week 2): Patient will evaluate/self monitor performance during various familiar activities with moderate verbal cues. SLP Short Term Goal 4 (Week 2): Patient will recall and utilize strategies to assist with working and prospective memory with mod verbal/visual cues.  Skilled Therapeutic Interventions: Skilled treatment focused on addressing cognition during self-care tasks. SLP facilitated session by orientating to place, situation and time.  SLP also facilitated session with creation of external aids for deficits and therapy tasks to increase awareness.  SLP initiated medication management task and was able to fade cues from max assist to mod assist with cues to utilize external aid.      FIM:  Comprehension Comprehension Mode: Auditory Comprehension: 5-Follows basic conversation/direction: With no assist Expression Expression Mode: Verbal Expression: 3-Expresses basic 50 - 74% of the time/requires cueing 25 - 50% of the time. Needs to repeat parts of sentences. Social Interaction Social Interaction: 3-Interacts appropriately 50 - 74% of the time - May be physically or verbally inappropriate. Problem Solving Problem Solving: 2-Solves basic 25 - 49% of the time - needs direction more than half the time to initiate, plan or complete simple activities Memory Memory: 2-Recognizes or recalls 25 - 49% of  the time/requires cueing 51 - 75% of the time FIM - Eating Eating Activity: 5: Set-up assist for open containers;5: Set-up assist for cut food  Pain Pain Assessment Pain Assessment: No/denies pain  Therapy/Group: Individual Therapy  Carmelia Roller., CCC-SLP [id number]  Acacia Villas 06/09/2012, 4:00 PM

## 2012-06-09 NOTE — Progress Notes (Signed)
At 2000 patient upset, reports wife in ED. Wife brought to patient's from ED for visit. 2 PRN vicodin given at 2027 for complaint of lower back pain. Tegaderm dressing noted to right elbow. Right ankle edema. Reports decreased sensation to bilateral feet. Scabbed area to 2 toes on right foot, OTA. Flat affect, decreased memory. Difficult to understand at times-dysarthric speech. Eric Scott A

## 2012-06-10 ENCOUNTER — Inpatient Hospital Stay (HOSPITAL_COMMUNITY): Payer: Medicare Other

## 2012-06-10 ENCOUNTER — Inpatient Hospital Stay (HOSPITAL_COMMUNITY): Payer: Medicare Other | Admitting: Speech Pathology

## 2012-06-10 LAB — CBC
Hemoglobin: 15.8 g/dL (ref 13.0–17.0)
Platelets: 241 10*3/uL (ref 150–400)
RBC: 4.72 MIL/uL (ref 4.22–5.81)
WBC: 9.4 10*3/uL (ref 4.0–10.5)

## 2012-06-10 LAB — GLUCOSE, CAPILLARY
Glucose-Capillary: 108 mg/dL — ABNORMAL HIGH (ref 70–99)
Glucose-Capillary: 118 mg/dL — ABNORMAL HIGH (ref 70–99)
Glucose-Capillary: 126 mg/dL — ABNORMAL HIGH (ref 70–99)

## 2012-06-10 LAB — BASIC METABOLIC PANEL
CO2: 25 mEq/L (ref 19–32)
Glucose, Bld: 115 mg/dL — ABNORMAL HIGH (ref 70–99)
Potassium: 3.9 mEq/L (ref 3.5–5.1)
Sodium: 138 mEq/L (ref 135–145)

## 2012-06-10 MED ORDER — CITALOPRAM HYDROBROMIDE 10 MG PO TABS
10.0000 mg | ORAL_TABLET | Freq: Every day | ORAL | Status: DC
  Administered 2012-06-10 – 2012-06-15 (×6): 10 mg via ORAL
  Filled 2012-06-10 (×7): qty 1

## 2012-06-10 MED ORDER — ALPRAZOLAM 0.25 MG PO TABS: 0.2500 mg | ORAL_TABLET | Freq: Three times a day (TID) | ORAL | Status: DC | PRN

## 2012-06-10 NOTE — Progress Notes (Signed)
Speech Language Pathology Daily Session Note  Patient Details  Name: Eric Scott MRN: [id number] Date of Birth: 11-29-1950  Today's Date: 06/10/2012 Time: [id number] Time Calculation (min): 45 min  Short Term Goals: Week 2: SLP Short Term Goal 1 (Week 2): Patient will improve speech intelligibility in challenging communicative environments utilizing strategies with supervision verbal/visual cues. SLP Short Term Goal 1 - Progress (Week 2): Other (comment) (not addressed) SLP Short Term Goal 2 (Week 2): Patient will demonstrate alternating attention in functional activities in moderately distracting environment with min verbal/visual cues.  SLP Short Term Goal 2 - Progress (Week 2): Progressing toward goal SLP Short Term Goal 3 (Week 2): Patient will evaluate/self monitor performance during various familiar activities with moderate verbal cues. SLP Short Term Goal 3 - Progress (Week 2): Progressing toward goal SLP Short Term Goal 4 (Week 2): Patient will recall and utilize strategies to assist with working and prospective memory with mod verbal/visual cues. SLP Short Term Goal 4 - Progress (Week 2): Progressing toward goal  Skilled Therapeutic Interventions: Session focused on facilitation of recall and awareness.  Discussed deficits resulting from CVA - reviewed focus of therapies and benefits; pt with mod cues to articulate deficits, impact, and purpose of therapies beyond general terms.  Flat affect, encouragement required to initiate and be proactive in session.  Reviewed list of meds and their purposes - pt able to recall meds and purpose after 2-5" delay with improved recall from 40% to 80% by end of session.      FIM:  Comprehension Comprehension Mode: Auditory Comprehension: 5-Follows basic conversation/direction: With no assist Expression Expression Mode: Verbal Expression: 4-Expresses basic 75 - 89% of the time/requires cueing 10 - 24% of the time. Needs helper to occlude  trach/needs to repeat words. Social Interaction Social Interaction: 3-Interacts appropriately 50 - 74% of the time - May be physically or verbally inappropriate. Problem Solving Problem Solving: 2-Solves basic 25 - 49% of the time - needs direction more than half the time to initiate, plan or complete simple activities Memory Memory: 2-Recognizes or recalls 25 - 49% of the time/requires cueing 51 - 75% of the time  Pain Pain Assessment Pain Assessment: No/denies pain Therapy/Group: Individual Therapy  Juan Quam Laurice 06/10/2012, 12:31 PM

## 2012-06-10 NOTE — Progress Notes (Signed)
Occupational Therapy Session Note  Patient Details  Name: Eric Scott MRN: [id number] Date of Birth: December 02, 1950  Today's Date: 06/10/2012 Time: [id number]  Time Calculation (min): 75 min   Short Term Goals: Week 2:  OT Short Term Goal 1 (Week 2): STG = LTG  Skilled Therapeutic Interventions/Progress Updates:   ADL re-training completed in shower this AM. Patient eating breakfast upon therapy arrival. Patient bathed and washed with supervision. No LOB. Patient did report SOB at breath with activity. Session with focus on standing balance, ADL performance, functional transfers, and safety awareness. Patient seemed fatigued at times and weak. Patient did not always stand at first attempt. Needed min guard when standing in shower. When standing at sink to pull up pants, patient only required close supervision. Patient left at sink with quick release belt on to shave and brush teeth.    Therapy Documentation Precautions:  Precautions Precautions: Fall Precaution Comments: contact precautions-C.diff  Restrictions Weight Bearing Restrictions: No Pain: Pain Assessment Pain Assessment: 0-10 Pain Score:   6 Pain Type: Chronic pain Pain Location: Back Pain Orientation: Lower Pain Descriptors: Aching Pain Frequency: Constant Pain Intervention(s): RN made aware 2nd Pain Site Pain Score: 6 Pain Type: Chronic pain Pain Location: Foot Pain Orientation: Right;Left Pain Descriptors: Aching Pain Frequency: Constant Pain Intervention(s): RN made aware  See FIM for current functional status  Therapy/Group: Individual Therapy  Ailene Ravel, OTR/L 06/10/2012, 8:36 AM

## 2012-06-10 NOTE — Progress Notes (Signed)
Physical Therapy Note  Patient Details  Name: DEMECO YAGI MRN: [id number] Date of Birth: 06-20-1950 Today's Date: 06/10/2012  11:15 - 12:00 45 minutes Individual session Patient reports pain at 6 in feet.  Treatment focused on increasing independence with mobility and gait. Patient propelled wheelchair 150+ feet using bilateral UE's. Patient ambulated 180 feet with rolling walker and minimal assistance. Patient worked on short distance ambulation and was able to ambulate 10 feet  With rolling walker and sit in chair 4 x with supervision. Patient ambulated around obstacles and stepped over objects with min assist for 1 LOB. Patient ambulated up and down 10 steps with bilateral rails and minimal contact assist. Patient exercised on Nustep x 10 minutes for LE strengthening and activity tolerance. Patient ambulate 150 feet with rolling walker and min assist.     Elder Love M 06/10/2012, 12:15 PM

## 2012-06-10 NOTE — Progress Notes (Signed)
Subjective/Complaints:  no new issues last night  A 12 point review of systems has been performed and if not noted above is otherwise negative.   Objective: Vital Signs: Blood pressure 116/64, pulse 80, temperature 97.5 F (36.4 C), temperature source Oral, resp. rate 18, height 6' (1.829 m), weight 94.9 kg (209 lb 3.5 oz), SpO2 95.00%. No results found. No results found for this basename: WBC:2,HGB:2,HCT:2,PLT:2 in the last 72 hours No results found for this basename: NA:2,K:2,CL:2,CO:2,GLUCOSE:2,BUN:2,CREATININE:2,CALCIUM:2 in the last 72 hours CBG (last 3)   Basename 06/09/12 2118 06/09/12 1636 06/09/12 1116  GLUCAP 139* 176* 177*    Wt Readings from Last 3 Encounters:  05/30/12 94.9 kg (209 lb 3.5 oz)    Physical Exam:  HENT:  Head: Normocephalic. Oral mucosa pink and moist  Eyes:  Pupils round and reactive to light  Neck: Neck supple. No thyromegaly present. No jvd  Cardiovascular: Normal rate and regular rhythm. No murmur  Pulmonary/Chest: Effort normal and breath sounds normal. He has no wheezes. No distress  Abdominal: Soft. Bowel sounds are normal. There is no tenderness.  Musculoskeletal: He exhibits no edema. Right ankle slightly swollen, cool, no instability Neurological: He is alert.  Patient is a poor historian. He was able to state his appropriate age and date of birth.  He did follow simple commands.  . Affect flat. RUE is   3-/5 . RLE is 1+/2 prox to 3 distally.   stocking glove sensory loss in both legs also.Sensation 1/2 Right arm,leg. Did withdraw to pain. Toes non reactive. Infrequently makes eye contact. Right facial droop. Speech sl dysarthric  Psych: affect extremely flat. Appears depressed at times.  Skin: Skin is warm and dry. Toe wound   Assessment/Plan: 1. Functional deficits secondary to left PLIC into thalamus thrombotic infarct which require 3+ hours per day of interdisciplinary therapy in a comprehensive inpatient rehab setting. Physiatrist is  providing close team supervision and 24 hour management of active medical problems listed below. Physiatrist and rehab team continue to assess barriers to discharge/monitor patient progress toward functional and medical goals. FIM: FIM - Bathing Bathing Steps Patient Completed: Chest;Right Arm;Left Arm;Abdomen;Front perineal area;Buttocks;Right upper leg;Left upper leg;Right lower leg (including foot);Left lower leg (including foot) Bathing: 4: Steadying assist  FIM - Upper Body Dressing/Undressing Upper body dressing/undressing steps patient completed: Thread/unthread right sleeve of pullover shirt/dresss;Thread/unthread left sleeve of pullover shirt/dress;Put head through opening of pull over shirt/dress;Pull shirt over trunk Upper body dressing/undressing: 5: Supervision: Safety issues/verbal cues FIM - Lower Body Dressing/Undressing Lower body dressing/undressing steps patient completed: Thread/unthread right underwear leg;Thread/unthread left underwear leg;Pull underwear up/down;Thread/unthread right pants leg;Thread/unthread left pants leg;Pull pants up/down;Don/Doff right sock;Don/Doff left sock Lower body dressing/undressing: 4: Steadying Assist  FIM - Toileting Toileting steps completed by patient: Adjust clothing prior to toileting;Performs perineal hygiene;Adjust clothing after toileting Toileting Assistive Devices: Grab bar or rail for support Toileting: 0: Activity did not occur  FIM - Radio producer Devices: Product manager Transfers: 0-Activity did not occur  FIM - Control and instrumentation engineer Devices: Arm rests Bed/Chair Transfer: 5: Supine > Sit: Supervision (verbal cues/safety issues);4: Bed > Chair or W/C: Min A (steadying Pt. > 75%);4: Chair or W/C > Bed: Min A (steadying Pt. > 75%)  FIM - Locomotion: Wheelchair Distance: 125 feet Locomotion: Wheelchair: 5: Travels 150 ft or more: maneuvers on rugs and over door sills  with supervision, cueing or coaxing FIM - Locomotion: Ambulation Locomotion: Ambulation Assistive Devices: Administrator Ambulation/Gait Assistance:  3: Mod assist Locomotion: Ambulation: 2: Travels 50 - 149 ft with moderate assistance (Pt: 50 - 74%)  Comprehension Comprehension Mode: Auditory Comprehension: 5-Follows basic conversation/direction: With no assist  Expression Expression Mode: Verbal Expression: 3-Expresses basic 50 - 74% of the time/requires cueing 25 - 50% of the time. Needs to repeat parts of sentences.  Social Interaction Social Interaction: 3-Interacts appropriately 50 - 74% of the time - May be physically or verbally inappropriate.  Problem Solving Problem Solving: 2-Solves basic 25 - 49% of the time - needs direction more than half the time to initiate, plan or complete simple activities  Memory Memory: 2-Recognizes or recalls 25 - 49% of the time/requires cueing 51 - 75% of the time  Medical Problem List and Plan:  1. Left thrombotic PLIC. infarct  2. DVT Prophylaxis/Anticoagulation: Subcutaneous Lovenox. Monitor platelet counts any signs of bleeding  3. Pain Management: Hydrocodone as needed. Monitor with increased mobility   -added neurontin for neuropathic pain (home med) which seems to have helped a little  - voltaren gel for right ankle (?OA) 4. Neuropsych: This patient is not capable of making decisions on his/her own behalf.   -team to provide egosupport. neuropsych eval, recs, and follow up appreciated   -initiate a trial of celexa. Prn xanax for anxiety  -needs a lot of positive reinforcement. Also cognitive-linguistic issues 5. Clostridium difficile.    -diarrhea improved--abx completed 6. Hypertension. Hydrochlorothiazide 12.5 mg daily held. Monitor the increased mobility  7. Borderline diabetes mellitus. Latest hemoglobin A1c 5.3. Will change to [facility] diet. Added low dose amaryl for better contro--titrate furtherl- , rx UTI 8. Tobacco abuse.  NicoDerm patch. Provide counseling.  9. Hyperlipidemia. Zocor 10. Hypokalemia/FEN: resume potassium if/when we restart hctz  -encourage fluids  -hctz held  - added megace which has helped appetite  -recheck bmet tomorrow 11. UTI- morganella and enterococcus,   sens to cipro--7 days  LOS (Days) 11 A FACE TO FACE EVALUATION WAS PERFORMED  Dondi Aime T 06/10/2012 7:23 AM

## 2012-06-10 NOTE — Progress Notes (Signed)
Physical Therapy Note  Patient Details  Name: Eric Scott MRN: [id number] Date of Birth: 10/25/1950 Today's Date: 06/10/2012  2:30 - 3:00 30 minutes Individual session Patient reports constant pain in feet and back. No number given.  Treatment focused on standing balance/activity tolerance, car transfers and gait. Patient stood and performed table top activity with pipe tree for 5 minutes with supervision. He used UE's occasionally for support throughout the standing activity. Patient performed simulated car transfer with supervision. Patient used UE's to lift right LE into car. Patient ambulated 275 feet with rolling walker and min assist. Patient performed wheelchair to bed transfer with supervision and sit to supine with supervision and bed rails. Patient left in bed with call bell in reach.      Elder Love M 06/10/2012, 3:30 PM

## 2012-06-11 ENCOUNTER — Inpatient Hospital Stay (HOSPITAL_COMMUNITY): Payer: Medicare Other | Admitting: Speech Pathology

## 2012-06-11 ENCOUNTER — Inpatient Hospital Stay (HOSPITAL_COMMUNITY): Payer: Medicare Other | Admitting: Occupational Therapy

## 2012-06-11 ENCOUNTER — Inpatient Hospital Stay (HOSPITAL_COMMUNITY): Payer: Medicare Other

## 2012-06-11 DIAGNOSIS — A0472 Enterocolitis due to Clostridium difficile, not specified as recurrent: Secondary | ICD-10-CM

## 2012-06-11 DIAGNOSIS — I6330 Cerebral infarction due to thrombosis of unspecified cerebral artery: Secondary | ICD-10-CM

## 2012-06-11 DIAGNOSIS — F329 Major depressive disorder, single episode, unspecified: Secondary | ICD-10-CM

## 2012-06-11 LAB — GLUCOSE, CAPILLARY
Glucose-Capillary: 149 mg/dL — ABNORMAL HIGH (ref 70–99)
Glucose-Capillary: 164 mg/dL — ABNORMAL HIGH (ref 70–99)
Glucose-Capillary: 159 mg/dL — ABNORMAL HIGH (ref 70–99)

## 2012-06-11 NOTE — Progress Notes (Signed)
Occupational Therapy Session Note  Patient Details  Name: Eric Scott MRN: [id number] Date of Birth: 07/02/1950  Today's Date: 06/11/2012 Time: [id number]  Time Calculation (min): 75 min   Short Term Goals: Week 2:  OT Short Term Goal 1 (Week 2): STG = LTG  Skilled Therapeutic Interventions/Progress Updates:   ADL re-training completed in shower this AM.  Session with focus on standing balance, ADL performance, functional transfers, and safety awareness. Patient seemed fatigued at times and took rest breaks when appropriate. When standing at sink to pull up pants, patient only required close supervision. Pt given Toth handled sponge to use in shower to help decrease back pain when bending to wash lower body. Increased time needed to complete tasks. Patient able to set-up breakfast tray independently. Pt left in room with breakfast, call light, and quick release belt.    Therapy Documentation Precautions:  Precautions Precautions: Fall Precaution Comments: contact precautions-C.diff  Restrictions Weight Bearing Restrictions: No Pain: Pain Assessment Pain Assessment: 0-10 Pain Score:   8 Pain Type: Chronic pain Pain Location: Back Pain Orientation: Lower Pain Descriptors: Aching Pain Frequency: Constant Pain Onset: On-going Patients Stated Pain Goal: 3 Pain Intervention(s): RN made aware and meds given Multiple Pain Sites: No  See FIM for current functional status  Therapy/Group: Individual Therapy  Ailene Ravel, OTR/L 06/11/2012, 8:26 AM

## 2012-06-11 NOTE — Progress Notes (Signed)
Subjective/Complaints: Up with OT. No new complaints.   A 12 point review of systems has been performed and if not noted above is otherwise negative.   Objective: Vital Signs: Blood pressure 132/77, pulse 78, temperature 97.7 F (36.5 C), temperature source Oral, resp. rate 18, height 6' (1.829 m), weight 94.9 kg (209 lb 3.5 oz), SpO2 97.00%. No results found.  Basename 06/10/12 0645  WBC 9.4  HGB 15.8  HCT 45.9  PLT 241    Basename 06/10/12 0645  NA 138  K 3.9  CL 101  GLUCOSE 115*  BUN 32*  CREATININE 1.06  CALCIUM 9.2   CBG (last 3)   Basename 06/11/12 0718 06/10/12 2117 06/10/12 1630  GLUCAP 112* 126* 202*    Wt Readings from Last 3 Encounters:  05/30/12 94.9 kg (209 lb 3.5 oz)    Physical Exam:  HENT:  Head: Normocephalic. Oral mucosa pink and moist  Eyes:  Pupils round and reactive to light  Neck: Neck supple. No thyromegaly present. No jvd  Cardiovascular: Normal rate and regular rhythm. No murmur  Pulmonary/Chest: Effort normal and breath sounds normal. He has no wheezes. No distress  Abdominal: Soft. Bowel sounds are normal. There is no tenderness.  Musculoskeletal: He exhibits no edema. Right ankle slightly swollen, cool, no instability Neurological: He is alert.    He was able to state his appropriate age and date of birth.  He did follow simple commands.  . Affect flat. RUE is   3-/5 . RLE is 1+/2 prox to 3 distally.   stocking glove sensory loss in both legs also.Sensation 1/2 Right arm,leg. Did withdraw to pain. Toes non reactive. Infrequently makes eye contact. Right facial droop. Speech sl dysarthric but easily intelligible Psych: affect extremely flat. Appears depressed at times.  Skin: Skin is warm and dry. Toe wound   Assessment/Plan: 1. Functional deficits secondary to left PLIC into thalamus thrombotic infarct which require 3+ hours per day of interdisciplinary therapy in a comprehensive inpatient rehab setting. Physiatrist is providing  close team supervision and 24 hour management of active medical problems listed below. Physiatrist and rehab team continue to assess barriers to discharge/monitor patient progress toward functional and medical goals. FIM: FIM - Bathing Bathing Steps Patient Completed: Chest;Right Arm;Left Arm;Abdomen;Front perineal area;Buttocks;Right upper leg;Left upper leg;Right lower leg (including foot);Left lower leg (including foot) Bathing: 4: Steadying assist  FIM - Upper Body Dressing/Undressing Upper body dressing/undressing steps patient completed: Thread/unthread right sleeve of pullover shirt/dresss;Thread/unthread left sleeve of pullover shirt/dress;Put head through opening of pull over shirt/dress;Pull shirt over trunk Upper body dressing/undressing: 5: Supervision: Safety issues/verbal cues FIM - Lower Body Dressing/Undressing Lower body dressing/undressing steps patient completed: Thread/unthread right underwear leg;Thread/unthread left underwear leg;Pull underwear up/down;Thread/unthread right pants leg;Thread/unthread left pants leg;Pull pants up/down;Don/Doff right sock;Don/Doff left sock Lower body dressing/undressing: 4: Steadying Assist  FIM - Toileting Toileting steps completed by patient: Performs perineal hygiene;Adjust clothing prior to toileting Toileting Assistive Devices: Grab bar or rail for support Toileting: 3: Mod-Patient completed 2 of 3 steps  FIM - Radio producer Devices: Grab bars Toilet Transfers: 4-To toilet/BSC: Min A (steadying Pt. > 75%);4-From toilet/BSC: Min A (steadying Pt. > 75%)  FIM - Bed/Chair Transfer Bed/Chair Transfer Assistive Devices: Bed rails Bed/Chair Transfer: 4: Bed > Chair or W/C: Min A (steadying Pt. > 75%);4: Chair or W/C > Bed: Min A (steadying Pt. > 75%)  FIM - Locomotion: Wheelchair Distance: 125 feet Locomotion: Wheelchair: 5: Travels 150 ft or more: maneuvers on  rugs and over door sills with supervision,  cueing or coaxing FIM - Locomotion: Ambulation Locomotion: Ambulation Assistive Devices: Walker - Rolling Ambulation/Gait Assistance: 4: Min assist Locomotion: Ambulation: 4: Travels 150 ft or more with minimal assistance (Pt.>75%)  Comprehension Comprehension Mode: Auditory Comprehension: 5-Follows basic conversation/direction: With extra time/assistive device  Expression Expression Mode: Verbal Expression: 4-Expresses basic 75 - 89% of the time/requires cueing 10 - 24% of the time. Needs helper to occlude trach/needs to repeat words.  Social Interaction Social Interaction: 3-Interacts appropriately 50 - 74% of the time - May be physically or verbally inappropriate.  Problem Solving Problem Solving: 2-Solves basic 25 - 49% of the time - needs direction more than half the time to initiate, plan or complete simple activities  Memory Memory: 2-Recognizes or recalls 25 - 49% of the time/requires cueing 51 - 75% of the time  Medical Problem List and Plan:  1. Left thrombotic PLIC. infarct  2. DVT Prophylaxis/Anticoagulation: Subcutaneous Lovenox. Monitor platelet counts any signs of bleeding  3. Pain Management: Hydrocodone as needed. Monitor with increased mobility   -added neurontin for neuropathic pain (home med) which seems to have helped  - voltaren gel for right ankle (?OA) 4. Neuropsych: This patient is not capable of making decisions on his/her own behalf.   -team to provide egosupport. neuropsych eval, recs, and follow up appreciated   -initiate a trial of celexa. Prn xanax for anxiety  -needs a lot of positive reinforcement. Also cognitive-linguistic issues 5. Clostridium difficile.    -diarrhea improved--abx completed 6. Hypertension. Hydrochlorothiazide 12.5 mg daily held. Monitor the increased mobility  7. DM:. Latest hemoglobin A1c 5.3. Will change to [facility] diet. Amaryl increased for better contro-  , rx'ing UTI 8. Tobacco abuse. NicoDerm patch. Provide counseling.  9.  Hyperlipidemia. Zocor 10. Hypokalemia/FEN: resume potassium if/when we restart hctz  -encourage fluids  -hctz held  - added megace which has helped appetite  -bun and cr improved 11. UTI- morganella and enterococcus,   sens to cipro--7 days  LOS (Days) 12 A FACE TO FACE EVALUATION WAS PERFORMED  Eric Scott T 06/11/2012 7:38 AM

## 2012-06-11 NOTE — Patient Care Conference (Signed)
Inpatient RehabilitationTeam Conference and Plan of Care Update Date: 06/11/2012   Time: 10:00 AM    Patient Name: Eric Scott      Medical Record Number: [id number]  Date of Birth: 1950-07-02 Sex: Male         Room/Bed: 4032/4032-01 Payor Info: Payor: [employer] MEDICARE  Plan: [employer] MEDICARE COMPLETE  Product Type: *No Product type*     Admitting Diagnosis: L CVA  Admit Date/Time:  05/30/2012  3:47 PM Admission Comments: No comment available   Primary Diagnosis:  CVA (cerebral infarction) Principal Problem: CVA (cerebral infarction)  Patient Active Problem List   Diagnosis Date Noted  . CVA (cerebral infarction) 06/01/2012  . Chronic pain (back, legs) 05/30/2012  . Toe laceration, 4th toe 05/30/2012  . C. difficile diarrhea 05/30/2012  . Hypokalemia 05/30/2012  . Acute lacunar stroke 05/27/2012  . DM type 2 (diabetes mellitus, type 2) 05/27/2012  . Tobacco abuse 05/27/2012  . HTN (hypertension) 05/27/2012    Expected Discharge Date:    Team Members Present: Physician leading conference: Dr. Alger Simons Social Worker Present: Ovidio Kin, LCSW Nurse Present: Nanine Means, RN PT Present: Raylene Everts, Renaye Rakers, PT OT Present: Antony Salmon, Ellen Henri, OT SLP Present: Gunnar Fusi, SLP Other (Discipline and Name): Jake Michaelis Coordinator     Current Status/Progress Goal Weekly Team Focus  Medical   diarrhea better. eating much better  depression, mood  see above   Bowel/Bladder   continent of bowel and bladder this shift  remain continent of bowel and bladder  continue timed toileting q3h   Swallow/Nutrition/ Hydration             ADL's   patient is currently Mod I for grooming; Supervision for LB dressing; Min Assist for bathing/LB dressing. Functional transfers: [place].  Supervision to [place] overall  Sitting balance, Bil UE strengthening, functional transfers, ADL performance, standing balance.    Mobility   supervision  to occ min assist with transfers; min assist gait 275 feet with RW  supervision to min assist with gait and stairs  increase functional independence, standing balance, gait and stairs   Communication   supervision-min assist depending on environment  supervision   increase awareness, self monitoring and correcting   Safety/Cognition/ Behavioral Observations  mod assist  min assist  increase awareenss, carryover, self monitoring and correcting    Pain   back pain. on vicodin q4prn  pain less than 2  pre  medicate prior to therapy   Skin   abrasion to sacrum  no new breakdown. protective barrier cream to sacrum  turn q2h. daily skin checks      *See Interdisciplinary Assessment and Plan and progress notes for [place] and short-term goals  Barriers to Discharge: see prior    Possible Resolutions to Barriers:  still we need SNF for assistance    Discharge Planning/Teaching Needs:  Spoken with wife and she can not offer any assistance, feels best option is NHP.  Looking for NH bed      Team Discussion:  Pt making progress, not enough to go home independently.  Antidepressant started-nutrition better  Revisions to Treatment Plan:  Searching for NH bed-trial staying in room over RN station.  Pt wants to stay in room by himself   Continued Need for Acute Rehabilitation Level of Care: The patient requires daily medical management by a physician with specialized training in physical medicine and rehabilitation for the following conditions: Daily direction of a multidisciplinary physical rehabilitation  program to ensure safe treatment while eliciting the highest outcome that is of practical value to the patient.: Yes Daily medical management of patient stability for increased activity during participation in an intensive rehabilitation regime.: Yes Daily analysis of laboratory values and/or radiology reports with any subsequent need for medication adjustment of medical intervention for : Post  surgical problems;Neurological problems;Other  Viral Schramm, Gardiner Rhyme 06/11/2012, 3:22 PM

## 2012-06-11 NOTE — Progress Notes (Signed)
Physical Therapy Session Note  Patient Details  Name: Eric Scott MRN: [id number] Date of Birth: [birth year]  Today's Date: 06/11/2012 Time: 1120-1200 Time Calculation (min): 40 min  Short Term Goals: Week 2:  PT Short Term Goal 1 (Week 2): STG's = LTG's  Skilled Therapeutic Interventions/Progress Updates:   Pt modified independent propelling his w/c room> gym.  Pt continues to have decreased awareness of R hand when propelling.  W/c> mat stand step transfer to R with close supervision.  Reviewed bed mobility for rolling L and R, supine scooting all without VCs for sequence, supervision.  neuromuscular re-education via forced use, tactile and VCs, for: - bil bridging, R unilateral bridging with assistance -cervical flexion in supine for abdominal activation -Trunk shortening/lengtheing with wt shifting while reaching diagonally out of BOS -gait x 150' with one turn to the R, min assist, RW, focusing on upright posture, forward gaze, = step lengths - gait up/down 5 steps with 2 rails, supervision > min assist, alternating feet up, non alternating down    Therapy Documentation Precautions:  Precautions Precautions: Fall Precaution Comments: contact precautions-C.diff  Restrictions Weight Bearing Restrictions: No   Pain: Pain Assessment LBP- chronic, unrated    See FIM for current functional status  Therapy/Group: Individual Therapy  [name] 06/11/2012, 12:20 PM

## 2012-06-11 NOTE — Progress Notes (Signed)
Speech Language Pathology Daily Session Note  Patient Details  Name: Eric Scott MRN: [id number] Date of Birth: 07/13/1950  Today's Date: 06/11/2012 Time: [id number] Time Calculation (min): 40 min  Short Term Goals: Week 2: SLP Short Term Goal 1 (Week 2): Patient will improve speech intelligibility in challenging communicative environments utilizing strategies with supervision verbal/visual cues. SLP Short Term Goal 1 - Progress (Week 2): Other (comment) (not addressed) SLP Short Term Goal 2 (Week 2): Patient will demonstrate alternating attention in functional activities in moderately distracting environment with min verbal/visual cues.  SLP Short Term Goal 2 - Progress (Week 2): Progressing toward goal SLP Short Term Goal 3 (Week 2): Patient will evaluate/self monitor performance during various familiar activities with moderate verbal cues. SLP Short Term Goal 3 - Progress (Week 2): Progressing toward goal SLP Short Term Goal 4 (Week 2): Patient will recall and utilize strategies to assist with working and prospective memory with mod verbal/visual cues. SLP Short Term Goal 4 - Progress (Week 2): Progressing toward goal  Skilled Therapeutic Interventions: Skilled treatment session focused on addressing cognition goals.  SLP facilitated session with functional task of meal planning with mod assist for mental flexibility to make other choices if special selections were not to liking.  Patient verbalized frustration with being left at RN station following therapy.  SLP educated patient on rationale and provided supervision verbal cues to verbally describe procedure for requesting help.  Upon return to room patient returned demonstration without cues.  SLP discussed trial of patient in room with quick release belt with team.     FIM:  Comprehension Comprehension Mode: Auditory Comprehension: 5-Follows basic conversation/direction: With extra time/assistive device Expression Expression Mode:  Verbal Expression: 4-Expresses basic 75 - 89% of the time/requires cueing 10 - 24% of the time. Needs helper to occlude trach/needs to repeat words. Social Interaction Social Interaction: 4-Interacts appropriately 75 - 89% of the time - Needs redirection for appropriate language or to initiate interaction. Problem Solving Problem Solving: 3-Solves basic 50 - 74% of the time/requires cueing 25 - 49% of the time Memory Memory: 3-Recognizes or recalls 50 - 74% of the time/requires cueing 25 - 49% of the time  Pain Pain Assessment Pain Assessment: No/denies pain Patient reports premedicated from RN  Therapy/Group: Individual Therapy  Carmelia Roller., CCC-SLP [id number]  [place] 06/11/2012, 9:57 AM

## 2012-06-11 NOTE — Progress Notes (Signed)
NUTRITION FOLLOW UP  Intervention:   Continue Glucerna Shake tid Provide meal preferences HS snack RD to continue to follow.  Nutrition Dx:   Inadequate oral intake related to poor appetite as evidenced by patient report  Goal:   Oral intake with meals and supplements to meet >/=90% of estimated nutrition needs-progressing  Monitor:   PO intake, weight trends, labs  Assessment:   States that he is eating fair.  (50-100% meals per documentation).  Likes and drinks Glucerna Shakes.  Weight decreased from 209 lbs to 205 lbs in the last 12 days.  Does not always like the food or get what he wants.   Height: Ht Readings from Last 1 Encounters:  05/30/12 6' (1.829 m)    Weight Status:   Wt Readings from Last 1 Encounters:  06/11/12 205 lb 0.4 oz (93 kg)    Re-estimated needs:  Kcal: 2000-2200 Protein: 100-110 gm Fluid: 2-2.2 L/day  Skin:   Toe wound  Diet Order: Carb Control   Intake/Output Summary (Last 24 hours) at 06/11/12 1442 Last data filed at 06/11/12 1400  Gross per 24 hour  Intake    840 ml  Output    701 ml  Net    139 ml    Labs:   Lab 06/10/12 0645 06/06/12 0620 06/05/12 0630  NA 138 136 135  K 3.9 4.1 4.8  CL 101 95* 96  CO2 25 32 28  BUN 32* 44* 38*  CREATININE 1.06 1.44* 1.32  CALCIUM 9.2 9.6 9.8  MG -- -- --  PHOS -- -- --  GLUCOSE 115* 197* 206*    CBG (last 3)   Basename 06/11/12 1202 06/11/12 0718 06/10/12 2117  GLUCAP 149* 112* 126*   Lab Results  Component Value Date   HGBA1C 5.3 05/28/2012   HGBA1C 5.2 05/27/2012   Lab Results  Component Value Date   LDLCALC 84 05/28/2012   CREATININE 1.06 06/10/2012     Scheduled Meds:   . aspirin  300 mg Rectal Daily   Or  . aspirin  325 mg Oral Daily  . ciprofloxacin  250 mg Oral BID  . citalopram  10 mg Oral QHS  . diclofenac sodium  2 g Topical QID  . enoxaparin (LOVENOX) injection  40 mg Subcutaneous Q24H  . feeding supplement  237 mL Oral TID BM  . gabapentin  100 mg Oral  TID  . glimepiride  4 mg Oral Q breakfast  . insulin aspart  0-15 Units Subcutaneous TID WC  . insulin aspart  0-5 Units Subcutaneous QHS  . nicotine  21 mg Transdermal Daily  . simvastatin  10 mg Oral q1800    Continuous Infusions:   Antonieta Iba, RD, LDN Clinical Inpatient Dietitian Pager:  [phone] Weekend and after hours pager:  [phone]

## 2012-06-11 NOTE — Progress Notes (Signed)
Occupational Therapy Session Note  Patient Details  Name: Eric Scott MRN: [id number] Date of Birth: 09-11-1950  Today's Date: 06/11/2012 Time: 1350-1430 Time Calculation (min): 40 min  OT Short Term Goals Week 2:  OT Short Term Goal 1 (Week 2): STG = LTG   Skilled Therapeutic Interventions/Progress Updates:  Patient resting in w/c in therapy gym upon arrival.  Engaged in standing tolerance and standing balance during tabletop coordination activity, stand step transfers w/c><therapy mat, dynamic sitting balance, UB and core strengthening activity and activity tolereance.  Patient required 4-5 rest breaks in 40 minute session.  Therapy Documentation Precautions:  Precautions Precautions: Fall Precaution Comments: contact precautions-C.diff  Restrictions Weight Bearing Restrictions: No General:   Vital Signs:   Pain:   ADL:   Exercises:   Other Treatments:    See FIM for current functional status  Therapy/Group: Individual Therapy  Venera Privott 06/11/2012, 2:13 PM

## 2012-06-12 ENCOUNTER — Inpatient Hospital Stay (HOSPITAL_COMMUNITY): Payer: Medicare Other

## 2012-06-12 ENCOUNTER — Encounter (HOSPITAL_COMMUNITY): Payer: Medicare Other

## 2012-06-12 ENCOUNTER — Inpatient Hospital Stay (HOSPITAL_COMMUNITY): Payer: Medicare Other | Admitting: Speech Pathology

## 2012-06-12 ENCOUNTER — Inpatient Hospital Stay (HOSPITAL_COMMUNITY): Payer: Medicare Other | Admitting: Occupational Therapy

## 2012-06-12 DIAGNOSIS — A0472 Enterocolitis due to Clostridium difficile, not specified as recurrent: Secondary | ICD-10-CM

## 2012-06-12 DIAGNOSIS — I6330 Cerebral infarction due to thrombosis of unspecified cerebral artery: Secondary | ICD-10-CM

## 2012-06-12 DIAGNOSIS — F329 Major depressive disorder, single episode, unspecified: Secondary | ICD-10-CM

## 2012-06-12 LAB — GLUCOSE, CAPILLARY
Glucose-Capillary: 80 mg/dL (ref 70–99)
Glucose-Capillary: 218 mg/dL — ABNORMAL HIGH (ref 70–99)

## 2012-06-12 LAB — BASIC METABOLIC PANEL
CO2: 29 mEq/L (ref 19–32)
Calcium: 9.4 mg/dL (ref 8.4–10.5)
Creatinine, Ser: 1.06 mg/dL (ref 0.50–1.35)
GFR calc Af Amer: 86 mL/min — ABNORMAL LOW (ref >90)
GFR calc non Af Amer: 74 mL/min — ABNORMAL LOW (ref >90)
Sodium: 142 mEq/L (ref 135–145)

## 2012-06-12 NOTE — Progress Notes (Signed)
Speech Language Pathology Daily Session Note  Patient Details  Name: Eric Scott MRN: [id number] Date of Birth: 02/11/1951  Today's Date: 06/12/2012 Time: 0900-0930 Time Calculation (min): 30 min  Short Term Goals: Week 2: SLP Short Term Goal 1 (Week 2): Patient will improve speech intelligibility in challenging communicative environments utilizing strategies with supervision verbal/visual cues. SLP Short Term Goal 1 - Progress (Week 2): Other (comment) (not addressed) SLP Short Term Goal 2 (Week 2): Patient will demonstrate alternating attention in functional activities in moderately distracting environment with min verbal/visual cues.  SLP Short Term Goal 2 - Progress (Week 2): Progressing toward goal SLP Short Term Goal 3 (Week 2): Patient will evaluate/self monitor performance during various familiar activities with moderate verbal cues. SLP Short Term Goal 3 - Progress (Week 2): Progressing toward goal SLP Short Term Goal 4 (Week 2): Patient will recall and utilize strategies to assist with working and prospective memory with mod verbal/visual cues. SLP Short Term Goal 4 - Progress (Week 2): Progressing toward goal  Skilled Therapeutic Interventions: Session focused on facilitation of recall and awareness. Patient met SLP prepared for therapy.  SLP facilitated session with increased wait time to allow patient to select food choices that were within the carb modified and heart healthy standards.  SLP also facilitated session with mod assist questions cues to name current medications and label function.      Pain: 0  Therapy/Group: Individual Therapy  Carmelia Roller., CCC-SLP [id number]  [place] 06/12/2012, 1:09 PM

## 2012-06-12 NOTE — Progress Notes (Signed)
Called to room by visitor in a room across the hall reporting "pt is getting up and going to fall".  Arrived to room and found pt sitting against bed in front of wheelchair.  Pt reported he could not wait any longer to go to bed, tried to get there on his own and slid onto the floor.   Noted nurse was "coming back at 2pm but he couldn't wait". Denied injury, reported slowly went down on the floor as legs could not hold him. Sat down on right side with legs under him.  Assisted pt to stand and sat on the bed, returned to bed and assessed pt for injury.  No injury noted vitals signs obtained.  Reminded pt of the need to call for assistance before getting up. Asked about location of safety belt that had been placed on the patient when sitting in the chair and he noted he had taken the safety belt off.  Pam Love notified of event and discussed plan of care to maintain safety was to take pt to the front when in the chair and not in therapy sessions; do not leave unattended in room in wheelchair.  Patient aware of safety plan changes and states an understanding of need for assistance with transfers.  Team made aware of safety plan change and vital sign checks. Margarito Liner

## 2012-06-12 NOTE — Progress Notes (Signed)
Patient ID: MOMIN PIENKOWSKI, male   DOB: 02-16-1951, 62 y.o.   MRN: [id number] Subjective/Complaints:  62 y.o. right-handed male with history of borderline diabetes mellitus and tobacco abuse as well as hypertension. Patient on no scheduled medications prior to admission. Admitted 05/27/2012 with right-sided weakness and altered mental status. 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 MRA of the head with moderate small vessel disease moderate proximal stenosis of the right P1 segment without any other significant proximal stenosis or aneurysm. Echocardiogram with ejection fraction of 60% and normal systolic function. Carotid Dopplers with no ICA stenosis. Maintained on aspirin therapy for stroke prophylaxis as well as subcutaneous Lovenox for DVT prophylaxis. A NicoDerm patch was introduced for history of tobacco abuse. 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 There was no obvious cord compression.. Clostridium difficile specimen obtained 05/29/2012 that was positive and patient initiated on Flagyl  Eating breakfast no new issues A 12 point review of systems has been performed and if not noted above is otherwise negative.   Objective: Vital Signs: Blood pressure 125/72, pulse 81, temperature 98.2 F (36.8 C), temperature source Oral, resp. rate 19, height 6' (1.829 m), weight 93 kg  (205 lb 0.4 oz), SpO2 95.00%. No results found.  Basename 06/10/12 0645  WBC 9.4  HGB 15.8  HCT 45.9  PLT 241    Basename 06/12/12 0545 06/10/12 0645  NA 142 138  K 4.4 3.9  CL 103 101  GLUCOSE 122* 115*  BUN 32* 32*  CREATININE 1.06 1.06  CALCIUM 9.4 9.2   CBG (last 3)   Basename 06/12/12 1212 06/12/12 0706 06/11/12 2102  GLUCAP 80 135* 159*    Wt Readings from Last 3 Encounters:  06/11/12 93 kg (205 lb 0.4 oz)    Physical Exam:  HENT:  Head: Normocephalic. Oral mucosa pink and moist  Eyes:  Pupils round and reactive to light  Neck: Neck supple. No thyromegaly present. No jvd  Cardiovascular: Normal rate and regular rhythm. No murmur  Pulmonary/Chest: Effort normal and breath sounds normal. He has no wheezes. No distress  Abdominal: Soft. Bowel sounds are normal. There is no tenderness.  Musculoskeletal: He exhibits no edema. Right ankle slightly swollen, cool, no instability Neurological: He is alert.    He was able to state his appropriate age and date of birth.  He did follow simple commands.  . Affect flat. RUE is   3-/5 . RLE is 1+/2 prox to 3 distally.   stocking glove sensory loss in both legs also.Sensation 1/2 Right arm,leg. Did withdraw to pain. Infrequently makes eye contact. Right facial droop. Speech sl dysarthric but easily intelligible Psych: affect extremely flat. Appears depressed at times.  Skin: Skin is warm and dry. Toe wound   Assessment/Plan: 1. 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 Physiatrist is providing close  team supervision and 24 hour management of active medical problems listed below. Physiatrist and rehab team continue to assess barriers to discharge/monitor patient progress toward functional and medical goals. FIM: FIM - Bathing Bathing Steps Patient Completed: Chest;Right  Arm;Left Arm;Abdomen;Front perineal area;Buttocks;Right upper leg;Left upper leg;Right lower leg (including foot);Left lower leg (including foot) Bathing: 5: Supervision: Safety issues/verbal cues  FIM - Upper Body Dressing/Undressing Upper body dressing/undressing steps patient completed: Thread/unthread right sleeve of pullover shirt/dresss;Thread/unthread left sleeve of pullover shirt/dress;Put head through opening of pull over shirt/dress;Pull shirt over trunk Upper body dressing/undressing: 6: More than reasonable amount of time FIM - Lower Body Dressing/Undressing Lower body dressing/undressing steps patient completed: Thread/unthread right underwear leg;Thread/unthread left underwear leg;Pull underwear up/down;Thread/unthread right pants leg;Thread/unthread left pants leg;Pull pants up/down;Don/Doff right sock;Don/Doff left sock Lower body dressing/undressing: 5: Supervision: Safety issues/verbal cues  FIM - Toileting Toileting steps completed by patient: Adjust clothing prior to toileting;Performs perineal hygiene;Adjust clothing after toileting Toileting Assistive Devices: Grab bar or rail for support Toileting: 5: Supervision: Safety issues/verbal cues  FIM - Radio producer Devices: Elevated toilet seat;Grab bars;Walker Toilet Transfers: 5-To toilet/BSC: Supervision (verbal cues/safety issues);5-From toilet/BSC: Supervision (verbal cues/safety issues)  FIM - Control and instrumentation engineer Devices: Copy: 5: Sit > Supine: Supervision (verbal cues/safety issues);5: Chair or W/C > Bed: Supervision (verbal cues/safety issues)  FIM - Locomotion: Wheelchair Distance: 125 feet Locomotion: Wheelchair: 6: Travels 150 ft or more, turns around, maneuvers to table, bed or toilet, negotiates 3% grade: maneuvers on rugs and over door sills independently FIM - Locomotion: Ambulation Locomotion: Ambulation Assistive Devices:  Administrator Ambulation/Gait Assistance: 4: Min assist Locomotion: Ambulation: 4: Travels 150 ft or more with minimal assistance (Pt.>75%)  Comprehension Comprehension Mode: Auditory Comprehension: 5-Follows basic conversation/direction: With no assist  Expression Expression Mode: Verbal Expression: 5-Expresses basic 90% of the time/requires cueing < 10% of the time.  Social Interaction Social Interaction: 5-Interacts appropriately 90% of the time - Needs monitoring or encouragement for participation or interaction.  Problem Solving Problem Solving: 4-Solves basic 75 - 89% of the time/requires cueing 10 - 24% of the time  Memory Memory: 4-Recognizes or recalls 75 - 89% of the time/requires cueing 10 - 24% of the time  Medical Problem List and Plan:  1. Left thrombotic PLIC adn thalamic. infarct  2. DVT Prophylaxis/Anticoagulation: Subcutaneous Lovenox. Monitor platelet counts any signs of bleeding  3. Pain Management: Hydrocodone as needed. Monitor with increased mobility   -added neurontin for neuropathic pain (home med) which seems to have helped  - voltaren gel for right ankle (?OA) 4. Neuropsych: This patient is not capable of making decisions on his/her own behalf.   -team to provide egosupport. neuropsych eval, recs, and follow up appreciated   -initiate a trial of celexa. Prn xanax for anxiety  -needs a lot of positive reinforcement. Also cognitive-linguistic issues 5. Clostridium difficile.    -diarrhea improved--abx completed 6. Hypertension. Hydrochlorothiazide 12.5 mg daily held. Monitor the increased mobility  7. DM:. Latest hemoglobin A1c 5.3. Will change to [facility] diet. Amaryl increased for better contro-  , rx'ing UTI 8. Tobacco abuse. NicoDerm patch. Provide counseling.  9. Hyperlipidemia. Zocor 10. Hypokalemia/FEN: resume potassium if/when we restart hctz  -encourage fluids  -hctz held  - added megace which has helped appetite  -bun and cr improved 11. UTI-  morganella and enterococcus,   sens to cipro--7 days  LOS (Days) 13 A FACE TO FACE EVALUATION WAS PERFORMED  Charlett Blake 06/12/2012  3:40 PM

## 2012-06-12 NOTE — Progress Notes (Signed)
Physical Therapy Session Note  Patient Details  Name: Eric Scott MRN: [id number] Date of Birth: Aug 22, 1950  Today's Date: 06/12/2012 Time: 1105-1205 Time Calculation (min): 60 min  Short Term Goals: Week 2:  PT Short Term Goal 1 (Week 2): STG's = LTG's  Skilled Therapeutic Interventions/Progress Updates:   Pt modified independent w/c mobililty room>< gym, with improved attention to R; w/c parts management with supervision without cues.  neuromuscular re-education via forced use, VCs, visual feedback, for: -midline orientation in static and dynamic standing with assistance -wt shifting L and R focusing on controlling drift to R -RLE stance stability during mini squats in R and L modified tandem positions -bil calf raises with extended knees, focusing on elevation and = wt bearing -closed chain hip abduction -gait x 150' with RW, min assist, focusing on upright posture, forward gaze  -gait up/down 5 steps x 2 with bil rails, alternating feet, supervision -Balance System working on postural stability and wt shifting; pt with improved ability to shift L today vs yesterday    Therapy Documentation Precautions:  Precautions Precautions: Fall Precaution Comments: contact precautions-C.diff Restrictions Weight Bearing Restrictions: No   Pain: Pain Assessment Pain Score:   5 Pain Location: Back Pain Orientation: Lower Pain Intervention(s): Medication (See eMAR)    See FIM for current functional status  Therapy/Group: Individual Therapy  [name] 06/12/2012, 12:13 PM

## 2012-06-12 NOTE — Progress Notes (Signed)
Occupational Therapy Session Note  Patient Details  Name: YARDLEY MINSER MRN: [id number] Date of Birth: [birth year]  Today's Date: 06/12/2012 Time: [id number] Time Calculation (min): 30 min  Short Term Goals: Week 2:  OT Short Term Goal 1 (Week 2): STG = LTG  Skilled Therapeutic Interventions/Progress Updates:  Patient resting in w/c upon arrival.  Patient engaged in toilet transfer and toileting with RW, groom at sink, bed transfer, bed mobility and UB strengthening.  Focused session on activity tolerance, standing balance, UB Theraband exercises sitting EOB.  Therapy Documentation Precautions:  Precautions Precautions: Fall Precaution Comments: contact precautions-C.diff Restrictions Weight Bearing Restrictions: No Pain: Pain Assessment Pain Assessment: No/denies pain  See FIM for current functional status  Therapy/Group: Individual Therapy  Kyuss Hale 06/12/2012, 3:36 PM

## 2012-06-12 NOTE — Progress Notes (Signed)
Occupational Therapy Session Note  Patient Details  Name: Eric Scott MRN: [id number] Date of Birth: May 19, 1951  Today's Date: 06/12/2012 Time: [id number] Time Calculation (min): 75 min  Short Term Goals: Week 2:  OT Short Term Goal 1 (Week 2): STG = LTG  Skilled Therapeutic Interventions/Progress Updates:    ADl re-training completed in shower this AM. Patient transferred to w/c and performed self-feeding during breakfast with Mod I. Session with focus on standing balance, ADL performance, functional transfer, standing tolerance and safety awareness. Patient's static standing balance showed increased performance this session allowing patient to be close supervision when standing to pull pants up. Patient still requires increased time during morning routine.   Therapy Documentation Precautions:  Precautions Precautions: Fall Precaution Comments: contact precautions-C.diff  Restrictions Weight Bearing Restrictions: No Pain: Pain Assessment Pain Assessment: No/denies pain Pain Score:   6 Pain Type: Chronic pain Pain Location: Back Pain Orientation: Lower Pain Descriptors: Aching Pain Frequency: Constant Pain Onset: Gradual Pain Intervention(s):  (Meidcated PTA) ADL:   Exercises:   Other Treatments:    See FIM for current functional status  Therapy/Group: Individual Therapy  Ailene Ravel, OTR/L 06/12/2012, 8:33 AM

## 2012-06-13 ENCOUNTER — Inpatient Hospital Stay (HOSPITAL_COMMUNITY): Payer: Medicare Other

## 2012-06-13 ENCOUNTER — Inpatient Hospital Stay (HOSPITAL_COMMUNITY): Payer: Medicare Other | Admitting: Speech Pathology

## 2012-06-13 ENCOUNTER — Inpatient Hospital Stay (HOSPITAL_COMMUNITY): Payer: Medicare Other | Admitting: *Deleted

## 2012-06-13 LAB — GLUCOSE, CAPILLARY: Glucose-Capillary: 63 mg/dL — ABNORMAL LOW (ref 70–99)

## 2012-06-13 NOTE — Significant Event (Signed)
Hypoglycemic Event  CBG: 63  Treatment: 15 GM carbohydrate snack  Symptoms: Shaky  Follow-up CBG: Time:1200 CBG Result:106  Possible Reasons for Event: Unknownunknown  Comments/MD notified:Dan Anguilli PAC    Elliot Cousin  Remember to initiate Hypoglycemia Order Set & complete

## 2012-06-13 NOTE — Progress Notes (Signed)
Patient ID: Eric Scott, male   DOB: May 02, 1951, 62 y.o.   MRN: [id number] Subjective/Complaints:  62 y.o. right-handed male with history of borderline diabetes mellitus and tobacco abuse as well as hypertension. Patient on no scheduled medications prior to admission. Admitted 05/27/2012 with right-sided weakness and altered mental status. 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 MRA of the head with moderate small vessel disease moderate proximal stenosis of the right P1 segment without any other significant proximal stenosis or aneurysm. Echocardiogram with ejection fraction of 60% and normal systolic function. Carotid Dopplers with no ICA stenosis. Maintained on aspirin therapy for stroke prophylaxis as well as subcutaneous Lovenox for DVT prophylaxis. A NicoDerm patch was introduced for history of tobacco abuse. 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 There was no obvious cord compression.. Clostridium difficile specimen obtained 05/29/2012 that was positive and patient initiated on Flagyl  More alert today Low back pain, had benefit from heat at home A 12 point review of systems has been performed and if not noted above is otherwise negative.   Objective: Vital Signs: Blood pressure 135/79, pulse 68, temperature 97.7 F (36.5 C), temperature source Oral, resp. rate 19, height 6' (1.829  m), weight 93 kg (205 lb 0.4 oz), SpO2 98.00%. No results found. No results found for this basename: WBC:2,HGB:2,HCT:2,PLT:2 in the last 72 hours  Basename 06/12/12 0545  NA 142  K 4.4  CL 103  GLUCOSE 122*  BUN 32*  CREATININE 1.06  CALCIUM 9.4   CBG (last 3)   Basename 06/13/12 0708 06/12/12 2101 06/12/12 1637  GLUCAP 150* 228* 218*    Wt Readings from Last 3 Encounters:  06/11/12 93 kg (205 lb 0.4 oz)    Physical Exam:  HENT:  Head: Normocephalic. Oral mucosa pink and moist  Eyes:  Pupils round and reactive to light  Neck: Neck supple. No thyromegaly present. No jvd  Cardiovascular: Normal rate and regular rhythm. No murmur  Pulmonary/Chest: Effort normal and breath sounds normal. He has no wheezes. No distress  Abdominal: Soft. Bowel sounds are normal. There is no tenderness.  Musculoskeletal: He exhibits no edema. Right ankle slightly swollen, cool, no instability Neurological: He is alert.    He was able to state his appropriate age and date of birth.  He did follow simple commands.  . Affect flat. RUE is   3-/5 . RLE is 1+/2 prox to 3 distally.   stocking glove sensory loss in both legs also.Sensation 1/2 Right arm,leg. Did withdraw to pain. Infrequently makes eye contact. Right facial droop. Speech sl dysarthric but easily intelligible Psych: affect extremely flat. Appears depressed at times.  Skin: Skin is warm and dry. Toe wound   Assessment/Plan: 1. 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 Physiatrist is providing close team supervision and 24 hour management of active medical  problems listed below. Physiatrist and rehab team continue to assess barriers to discharge/monitor patient progress toward functional and medical goals. FIM: FIM - Bathing Bathing Steps Patient Completed: Chest;Right Arm;Left  Arm;Abdomen;Front perineal area;Buttocks;Right upper leg;Left upper leg;Right lower leg (including foot);Left lower leg (including foot) Bathing: 5: Supervision: Safety issues/verbal cues  FIM - Upper Body Dressing/Undressing Upper body dressing/undressing steps patient completed: Thread/unthread right sleeve of pullover shirt/dresss;Thread/unthread left sleeve of pullover shirt/dress;Put head through opening of pull over shirt/dress;Pull shirt over trunk Upper body dressing/undressing: 6: More than reasonable amount of time FIM - Lower Body Dressing/Undressing Lower body dressing/undressing steps patient completed: Thread/unthread right underwear leg;Thread/unthread left underwear leg;Pull underwear up/down;Thread/unthread right pants leg;Thread/unthread left pants leg;Pull pants up/down;Don/Doff right sock;Don/Doff left sock Lower body dressing/undressing: 5: Supervision: Safety issues/verbal cues  FIM - Toileting Toileting steps completed by patient: Adjust clothing prior to toileting;Performs perineal hygiene;Adjust clothing after toileting Toileting Assistive Devices: Grab bar or rail for support Toileting: 5: Supervision: Safety issues/verbal cues  FIM - Radio producer Devices: Elevated toilet seat;Grab bars;Walker Toilet Transfers: 5-To toilet/BSC: Supervision (verbal cues/safety issues);5-From toilet/BSC: Supervision (verbal cues/safety issues)  FIM - Control and instrumentation engineer Devices: Copy: 5: Sit > Supine: Supervision (verbal cues/safety issues);5: Chair or W/C > Bed: Supervision (verbal cues/safety issues)  FIM - Locomotion: Wheelchair Distance: 125 feet Locomotion: Wheelchair: 6: Travels 150 ft or more, turns around, maneuvers to table, bed or toilet, negotiates 3% grade: maneuvers on rugs and over door sills independently FIM - Locomotion: Ambulation Locomotion: Ambulation Assistive Devices: Astronomer Ambulation/Gait Assistance: 4: Min assist Locomotion: Ambulation: 4: Travels 150 ft or more with minimal assistance (Pt.>75%)  Comprehension Comprehension Mode: Auditory Comprehension: 5-Follows basic conversation/direction: With extra time/assistive device  Expression Expression Mode: Verbal Expression: 5-Expresses complex 90% of the time/cues < 10% of the time  Social Interaction Social Interaction: 5-Interacts appropriately 90% of the time - Needs monitoring or encouragement for participation or interaction.  Problem Solving Problem Solving: 4-Solves basic 75 - 89% of the time/requires cueing 10 - 24% of the time  Memory Memory: 4-Recognizes or recalls 75 - 89% of the time/requires cueing 10 - 24% of the time  Medical Problem List and Plan:  1. Left thrombotic PLIC adn thalamic. infarct  2. DVT Prophylaxis/Anticoagulation: Subcutaneous Lovenox. Monitor platelet counts any signs of bleeding  3. Pain Management: Hydrocodone as needed. Monitor with increased mobility, add K pad  -added neurontin for neuropathic pain (home med) which seems to have helped  - voltaren gel for right ankle (?OA) 4. Neuropsych: This patient is not capable of making decisions on his/her own behalf.   -team to provide egosupport. neuropsych eval, recs, and follow up appreciated   -initiate a trial of celexa. Prn xanax for anxiety  -needs a lot of positive reinforcement. Also cognitive-linguistic issues 5. Clostridium difficile.    -diarrhea improved--abx completed 6. Hypertension. Hydrochlorothiazide 12.5 mg daily held. Monitor the increased mobility  7. DM:. Latest hemoglobin A1c 5.3. Will change to [facility] diet. Amaryl increased for better contro-  , rx'ing UTI 8. Tobacco abuse. NicoDerm patch. Provide counseling.  9. Hyperlipidemia. Zocor 10. Hypokalemia/FEN: resume potassium if/when we restart hctz  -encourage fluids  -hctz held  - added megace which has helped appetite  -bun and cr  improved   LOS (Days) 14 A FACE TO FACE EVALUATION WAS PERFORMED  Alysia Penna E 06/13/2012 7:32 AM

## 2012-06-13 NOTE — Progress Notes (Signed)
Social Work Patient ID: Eric Scott, male   DOB: 24-Mar-1951, 62 y.o.   MRN: [id number] Met with pt to give him several bed offers from area NH's.  He has chosen [facility] and the plan is for him to transfer on Monday. Have contacted wife at pt's request to let her know of the move.  He hopes he will do well there and be able to return home soon. Will transport pt non-emergency transport and gather paperwork for Monday around 11;00.

## 2012-06-13 NOTE — Progress Notes (Signed)
Inpatient Diabetes Program Recommendations  AACE/ADA: New Consensus Statement on Inpatient Glycemic Control (2013)  Target Ranges:  Prepandial:   less than 140 mg/dL      Peak postprandial:   less than 180 mg/dL (1-2 hours)      Critically ill patients:  140 - 180 mg/dL   Reason for Visit: Hypoglycemia mid-day followed by hyperglycemia in afternoon and evenings.  Inpatient Diabetes Program Recommendations Insulin - Basal: xxxxxxxxxxxxxx Oral Agents: Please decrease Amaryl to 2 mg/day.  Pt has hypoglycemia mid-day.  PO intake is variable.  Note: Thank you, Rosita Kea, RN, CNS, Diabetes Coordinator [phone])

## 2012-06-13 NOTE — Progress Notes (Signed)
Speech Language Pathology Daily Session Note & Discharge Summary  Patient Details  Name: LEIGHLAND MISFELDT MRN: [id number] Date of Birth: 11/19/1950  Today's Date: 06/13/2012 Time: [id number] Time Calculation (min): 30 min  Short Term Goals: Week 2: SLP Short Term Goal 1 (Week 2): Patient will improve speech intelligibility in challenging communicative environments utilizing strategies with supervision verbal/visual cues. SLP Short Term Goal 1 - Progress (Week 2): Other (comment) (not addressed) SLP Short Term Goal 2 (Week 2): Patient will demonstrate alternating attention in functional activities in moderately distracting environment with min verbal/visual cues.  SLP Short Term Goal 2 - Progress (Week 2): Progressing toward goal SLP Short Term Goal 3 (Week 2): Patient will evaluate/self monitor performance during various familiar activities with moderate verbal cues. SLP Short Term Goal 3 - Progress (Week 2): Progressing toward goal SLP Short Term Goal 4 (Week 2): Patient will recall and utilize strategies to assist with working and prospective memory with mod verbal/visual cues. SLP Short Term Goal 4 - Progress (Week 2): Progressing toward goal  Skilled Therapeutic Interventions: Skilled treatment session focused on addressing cognition goals during a self care task.  SLP facilitated session with list of 3 locations/items to find on unit.  SLP facilitated input of information with repeatition and association strategies.  Patietn was able to recall and things and locate them with min assist vebral cues during a 30 minute treatmetn session.  Patient recalled and verbalized location of SNF that he will be transfereing to on Monday.     FIM:  Comprehension Comprehension Mode: Auditory Comprehension: 5-Follows basic conversation/direction: With extra time/assistive device Expression Expression Mode: Verbal Expression: 5-Expresses complex 90% of the time/cues < 10% of the time Social  Interaction Social Interaction: 5-Interacts appropriately 90% of the time - Needs monitoring or encouragement for participation or interaction. Problem Solving Problem Solving: 4-Solves basic 75 - 89% of the time/requires cueing 10 - 24% of the time Memory Memory: 4-Recognizes or recalls 75 - 89% of the time/requires cueing 10 - 24% of the time FIM - Eating Eating Activity: 6: More than reasonable amount of time  Pain Pain Assessment Pain Assessment: No/denies pain  Therapy/Group: Individual Therapy   Speech Language Pathology Discharge Summary  Patient Details  Name: ELDIN NEIDICH MRN: [id number] Date of Birth: 08/11/1950  Today's Date: 06/13/2012  Patient has met 4 of 4 [place] term goals.  Patient to discharge at [facility] level.  Reasons goals not met: n/a   Clinical Impression/Discharge Summary: Patient met 4 out of 4 [place] term objectives this reporting period due to gains in speech intelligibility, attention, awareness and recall.  Patient has progressed from max assist to min assist with emergent awareness and recall.  He requires more assist with set up initially and then less cues once external memory aids are made.  He speech and attention goals are no longer areas of need; however, awareness and recall deficits continue to impact his functional independence and require 24/7 supervision.  As a result, it is recommended that this patient receive skilled SLP services at the next level of care to further address these deficits and reduce burden of care.   Care Partner:  Caregiver Able to Provide Assistance: No  Type of Caregiver Assistance: Physical;Cognitive  Recommendation:  24 hour supervision/assistance;Skilled Nursing facility  Rationale for SLP Follow Up: Maximize cognitive function and independence;Reduce caregiver burden   Equipment: none   Reasons for discharge: Treatment goals met;Discharged from hospital   Patient/Family Agrees with Progress Made and  Goals  Achieved: Yes   See FIM for current functional status  Carmelia Roller., CCC-SLP [id number]  [place] 06/13/2012, 9:51 AM

## 2012-06-13 NOTE — Progress Notes (Signed)
Patient ID: Eric Scott, male   DOB: 05-Aug-1950, 62 y.o.   MRN: [id number]  Neuropsychology Psychosocial Evaluation  Per request from the treatment team, I met with Mr. Davidjames Esparza today for a supportive therapy session to provide continued assessment of his mood and coping skills as he adjusts to his medical condition post-stroke.  Overall, Mr. Kerin continued to express frustration over having to ask for help, but generally denied symptoms of depression and described his mood as "fine."  When pushed, he admitted to having concern over one issue: an unpaid traffic ticket that he said his wife needed to pay within the next couple of weeks.  Not being able to take care of this and similar issues has reportedly left him feeling "helpless" at times.  Objectively, his responses to self-report measures of mood symptoms were reflective of a significant improvement in levels of anxiety (now mild range) and depression (now within normal limits).  He acknowledged recognition of some improvements he has made in his physical recovery, including wheeling himself to therapies.  Notably, Mr. Amber affect continues to appear depressed, despite his denial of depressive symptoms.  He readily smiled in response to joking from the neuropsychologist and this may be a way for staff to interact with him when he appears depressed so that they are able to detect whether it is simply his facial expression at the time, or whether his mood is indeed, affected.  During the session, he reported about his plans for discharge, including further inpatient rehabilitation in a nursing home and he seemed content with the plans as they have been laid out.  Given improvement seen in mood since prior evaluation last week, I will not plan to see him again, unless significant changes in mood arise.    Greater than 50% of this visit was spent educating the patient about the possible diagnosis, prognosis, management plan, and in coordination of  care.    Marlane Hatcher, PsyD Clinical Neuropsychologist

## 2012-06-13 NOTE — Progress Notes (Signed)
Physical Therapy Weekly Progress Note  Patient Details  Name: Eric Scott MRN: [id number] Date of Birth: 1951/03/08  Today's Date: 06/13/2012 Time: [id number] Time Calculation (min): 45 min  Patient has surpassed most LTG's and they have been upgraded. Patient has shown great improvement in balance and postural control which has decreased caregiver assist with functional mobility.   Patient continues to demonstrate the following deficits: impulsivity, decrease safety awareness, generalized weakness and therefore will continue to benefit from skilled PT intervention to enhance overall performance with balance and postural control.  Patient progressing toward [place] term goals..  Plan of care revisions: Goals upgraded. .  PT Short Term Goals Week 2:  PT Short Term Goal 1 (Week 2): STG's = LTG's  Skilled Therapeutic Interventions/Progress Updates:  Patient ambulated 250 feet with rolling walker and close supervision. Patient ambulated up and down 15 steps with supervision and bilateral rails. Patient performed stand turn transfers wheelchair to mat with modified independence. Patient worked on tapping feet alternately on 4 inch step with min assist and no assistive device. Patient side stepped 10 feet to left and right with min assist. Patient with 1 LOB. Patient ambulated up and down 4 inch curb with RW and close supervision. Patient exercised on Nustep x 10 minutes - workload 5.   Therapy Documentation Precautions:  Precautions Precautions: Fall Precaution Comments: contact precautions-C.diff Restrictions Weight Bearing Restrictions: No   Pain: Pain Assessment Pain Assessment: 0-10 Pain Score:   5 Pain Type: Chronic pain Pain Location: Back Pain Orientation: Lower Pain Descriptors: Aching Pain Frequency: Constant Pain Onset: On-going Pain Intervention(s): Other (Comment) (Pt states he was medicated prior to therapy arrival.) Locomotion : Ambulation Ambulation/Gait Assistance:  5: Supervision   See FIM for current functional status  Therapy/Group: Individual Therapy  Elder Love M 06/13/2012, 11:07 AM

## 2012-06-13 NOTE — Progress Notes (Signed)
Physical Therapy Session Note  Patient Details  Name: Eric Scott MRN: [id number] Date of Birth: 1950-07-19  Today's Date: 06/13/2012 Time: [id number] Time Calculation (min): 43 min  Skilled Therapeutic Interventions/Progress Updates:     Patient propelled himself in wheelchair from room to gym. Today's session focused on gait training (on tile and carpeted surfaces), balance, and NMR of R LE; see details below.Attempted to perform sit<>stands with patient's B LE on foam wedge to facilitate balance challenge and enforce anterior weight shift to get to standing. Patient with multiple attempts to stand without assistance, but unable to and requires min assist to do so. Only 3 sit>stands performed before patient became frustrated and activity discontinued. Attempted sit>stands from wheelchair with R LE positioned slightly anterior to L LE to facilitate increased weight bearing and strengthening through R LE. Patient performed 5 sit>stands with this method with min assist before becoming too fatigued to continue.  Patient propelled wheelchair 37' with use of only R LE to strengthen. Patient returned to room and left seated in wheelchair with seatbelt donned and all needs within reach.   Therapy Documentation Precautions:  Precautions Precautions: Fall Precaution Comments: contact precautions-C.diff  Restrictions Weight Bearing Restrictions: No Pain: Pain Assessment Pain Assessment: No/denies pain Pain Score: 0-No pain Mobility: Transfers Sit to Stand: 5: Supervision;With armrests;With upper extremity assist;From chair/3-in-1 Sit to Stand Details: Verbal cues for precautions/safety Stand to Sit: 5: Supervision;With armrests;With upper extremity assist;To chair/3-in-1 Stand to Sit Details (indicate cue type and reason): Verbal cues for precautions/safety Stand Pivot Transfers: 5: Supervision;With armrests Stand Pivot Transfer Details: Verbal cues for precautions/safety Locomotion  : Ambulation Ambulation: Yes Ambulation/Gait Assistance: 5: Supervision Ambulation Distance (Feet): 200 Feet Assistive device: Rolling walker Ambulation/Gait Assistance Details: Verbal cues for gait pattern;Verbal cues for precautions/safety Gait Gait: Yes Gait Pattern: Impaired Gait Pattern: Step-through pattern;Decreased stride length;Decreased dorsiflexion - right;Decreased dorsiflexion - left;Narrow base of support;Trunk flexed;Engineer, water: Yes Wheelchair Assistance: 6: Modified independent (Device/Increase time) Environmental health practitioner: Both upper extremities Wheelchair Parts Management: Needs assistance Distance: 150  Balance: Theatre stage manager Standing - Balance Support: No upper extremity supported Static Standing - Level of Assistance: 5: Stand by assistance;4: Min assist Static Standing - Comment/# of Minutes: Patient performed 3 rounds x30-45" each of standing on foam, feet together, without UE support. Patient with one LOB posteriorly and to the R, requiring min assist to maintain balance.  See FIM for current functional status  Therapy/Group: Individual Therapy  Lillia Abed. Marshell Dilauro, PT, DPT  06/13/2012, 4:05 PM

## 2012-06-13 NOTE — Progress Notes (Signed)
Occupational Therapy Weekly Progress Note  Patient Details  Name: Eric Scott MRN: [id number] Date of Birth: 07/12/1950  Today's Date: 06/13/2012 Time: [id number] Time Calculation (min): 75 min  Patient has met 0 of 1 short term goals.  Patient's short term goals for week 2 were the same as [place] term goals. Although, patient has not met LTGs at the present, patient is progressing towards goals and is motivated to do his best in therapy. Seems that patient's length of stay was originally expected to be shorter rather than longer. Patient has been consistently at [place] to close supervision for bathing and dressing. Since patient's D/C plan has been TBD, patient has not had a definite D/C plan. So far, patient will be a SNF placement due to requiring 24 hour supervision.  Patient continues to demonstrate the following deficits: standing balance, standing tolerance, functional transfers, and pain and therefore will continue to benefit from skilled OT intervention to enhance overall performance with BADL.  Patient progressing toward [place] term goals..  Continue plan of care.  OT Short Term Goals Week 3:  OT Short Term Goal 1 (Week 3): STG = LTG  Skilled Therapeutic Interventions/Progress Updates:    ADL re-training. Session with focus on self-feeding, ADL performance, functional transfers, and standing balance. Pt transferred to w/c from bed with min assist. 1 LOB posteriorly during static standing. Patient stood at EOB to pull gown around back side and needed Min assist to correct self. Patient ate breakfast at Mod I level. Patient dressed at sink level and performed grooming task. When pulling up pants at sink, patient only required close supervision. No LOB noted. Patient continues to require increased time to complete tasks. Tremors in B UE still noted causing decreased gross motor coordination and movement fluidity.   Therapy Documentation Precautions:  Precautions Precautions:  Fall Precaution Comments: contact precautions-C.diff Restrictions Weight Bearing Restrictions: No Pain: Pain Assessment Pain Assessment: 0-10 Pain Score:   5 Pain Type: Chronic pain Pain Location: Back Pain Orientation: Lower Pain Descriptors: Aching Pain Frequency: Constant Pain Onset: On-going Pain Intervention(s): Other (Comment) (Pt states he was medicated prior to therapy arrival.)  See FIM for current functional status  Therapy/Group: Individual Therapy  Ailene Ravel, OTR/L 06/13/2012, 8:43 AM

## 2012-06-14 ENCOUNTER — Inpatient Hospital Stay (HOSPITAL_COMMUNITY): Payer: Medicare Other | Admitting: Physical Therapy

## 2012-06-14 LAB — GLUCOSE, CAPILLARY
Glucose-Capillary: 105 mg/dL — ABNORMAL HIGH (ref 70–99)
Glucose-Capillary: 179 mg/dL — ABNORMAL HIGH (ref 70–99)

## 2012-06-14 NOTE — Progress Notes (Signed)
Physical Therapy Note  Patient Details  Name: Eric Scott MRN: [id number] Date of Birth: 1951-04-22 Today's Date: 06/14/2012  1300-1355 (55 minutes) group Pain: bilateral feet/premedicated Pt participated in PT group session focused on gait training/safety/endurance. Pt ambulates 80 feet X 3 with RW SBA.    Sammi Stolarz,JIM 06/14/2012, 7:53 AM

## 2012-06-14 NOTE — Progress Notes (Signed)
Patient ID: Eric Scott, male   DOB: [birth year], 62 y.o.   MRN: [id number] Subjective/Complaints:  62 y.o. right-handed male with history of borderline diabetes mellitus and tobacco abuse as well as hypertension. Patient on no scheduled medications prior to admission. Admitted 05/27/2012 with right-sided weakness and altered mental status. 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 MRA of the head with moderate small vessel disease moderate proximal stenosis of the right P1 segment without any other significant proximal stenosis or aneurysm. Echocardiogram with ejection fraction of 60% and normal systolic function. Carotid Dopplers with no ICA stenosis. Maintained on aspirin therapy for stroke prophylaxis as well as subcutaneous Lovenox for DVT prophylaxis. A NicoDerm patch was introduced for history of tobacco abuse. 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 There was no obvious cord compression.. Clostridium difficile specimen obtained 05/29/2012 that was positive and patient initiated on Flagyl  No back pain issues Aware of Monday tranfer to SNF A 12 point review of systems has been performed and if not noted above is otherwise negative.   Objective: Vital Signs: Blood pressure 135/80, pulse 79, temperature 97.5 F (36.4 C), temperature source Oral, resp. rate 16, height 6' (1.829 m), weight  93 kg (205 lb 0.4 oz), SpO2 95.00%. No results found. No results found for this basename: WBC:2,HGB:2,HCT:2,PLT:2 in the last 72 hours  Basename 06/12/12 0545  NA 142  K 4.4  CL 103  GLUCOSE 122*  BUN 32*  CREATININE 1.06  CALCIUM 9.4   CBG (last 3)   Basename 06/13/12 2117 06/13/12 1634 06/13/12 1209  GLUCAP 84 210* 106*    Wt Readings from Last 3 Encounters:  06/11/12 93 kg (205 lb 0.4 oz)    Physical Exam:  HENT:  Head: Normocephalic. Oral mucosa pink and moist   Neck: Neck supple. No thyromegaly present. No jvd  Cardiovascular: Normal rate and regular rhythm. No murmur  Pulmonary/Chest: Effort normal and breath sounds normal. He has no wheezes. No distress  Abdominal: Soft. Bowel sounds are normal. There is no tenderness.  Musculoskeletal: He exhibits no edema. Right ankle slightly swollen, cool, no instability Neurological: He is alert.    He was able to state his appropriate age and date of birth.  He did follow simple commands.  . Affect flat. RUE is   3-/5 . RLE is 1+/2 prox to 3 distally.   stocking glove sensory loss in both legs also.Sensation 1/2 Right arm,leg. Did withdraw to pain. good eye contact. Right facial droop. Speech sl dysarthric but easily intelligible Psych: affect extremely flat. Appears depressed at times.  Skin: Skin is warm and dry. Toe wound   Assessment/Plan: 1. 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 Physiatrist is providing close team supervision and 24 hour management of active medical problems listed below. Physiatrist and rehab team continue to assess barriers  to discharge/monitor patient progress toward functional and medical goals. FIM: FIM - Bathing Bathing Steps Patient Completed: Chest;Right Arm;Left Arm;Abdomen;Front perineal area;Buttocks;Right upper leg;Left upper leg;Right  lower leg (including foot);Left lower leg (including foot) Bathing: 0: Activity did not occur  FIM - Upper Body Dressing/Undressing Upper body dressing/undressing steps patient completed: Thread/unthread right sleeve of pullover shirt/dresss;Thread/unthread left sleeve of pullover shirt/dress;Put head through opening of pull over shirt/dress;Pull shirt over trunk Upper body dressing/undressing: 6: More than reasonable amount of time FIM - Lower Body Dressing/Undressing Lower body dressing/undressing steps patient completed: Thread/unthread right underwear leg;Thread/unthread left underwear leg;Pull underwear up/down;Thread/unthread right pants leg;Thread/unthread left pants leg;Pull pants up/down;Don/Doff right sock;Don/Doff left sock Lower body dressing/undressing: 5: Set-up assist to: Obtain clothing  FIM - Toileting Toileting steps completed by patient: Adjust clothing prior to toileting;Performs perineal hygiene;Adjust clothing after toileting Toileting Assistive Devices: Grab bar or rail for support Toileting: 6: More than reasonable amount of time (with urinal)  FIM - Air cabin crew Transfers Assistive Devices: Elevated toilet seat;Grab bars;Walker Clinical cytogeneticist Transfers: 0-Activity did not occur  FIM - Engineer, site: Environmental consultant;Arm rests Bed/Chair Transfer: 5: Bed > Chair or W/C: Supervision (verbal cues/safety issues);5: Chair or W/C > Bed: Supervision (verbal cues/safety issues)  FIM - Locomotion: Wheelchair Distance: 150 Locomotion: Wheelchair: 6: Travels 150 ft or more, turns around, maneuvers to table, bed or toilet, negotiates 3% grade: maneuvers on rugs and over door sills independently FIM - Locomotion: Ambulation Locomotion: Ambulation Assistive Devices: Administrator Ambulation/Gait Assistance: 5: Supervision Locomotion: Ambulation: 5: Travels 150 ft or more with supervision/safety issues  Comprehension Comprehension Mode:  Auditory Comprehension: 5-Follows basic conversation/direction: With extra time/assistive device  Expression Expression Mode: Verbal Expression: 5-Expresses complex 90% of the time/cues < 10% of the time  Social Interaction Social Interaction: 5-Interacts appropriately 90% of the time - Needs monitoring or encouragement for participation or interaction.  Problem Solving Problem Solving: 4-Solves basic 75 - 89% of the time/requires cueing 10 - 24% of the time  Memory Memory: 4-Recognizes or recalls 75 - 89% of the time/requires cueing 10 - 24% of the time  Medical Problem List and Plan:  1. Left thrombotic PLIC adn thalamic. infarct  2. DVT Prophylaxis/Anticoagulation: Subcutaneous Lovenox. Monitor platelet counts any signs of bleeding  3. Pain Management: Hydrocodone as needed. Monitor with increased mobility, add K pad  -added neurontin for neuropathic pain (home med) which seems to have helped  - voltaren gel for right ankle (?OA) 4. Neuropsych: This patient is not capable of making decisions on his/her own behalf.   -team to provide egosupport. neuropsych eval, recs, and follow up appreciated   -initiate a trial of celexa. Prn xanax for anxiety  -needs a lot of positive reinforcement. Also cognitive-linguistic issues 5. Clostridium difficile.    -diarrhea improved--abx completed 6. Hypertension. Hydrochlorothiazide 12.5 mg daily held. Monitor the increased mobility  7. DM:. Latest hemoglobin A1c 5.3. Will change to [facility] diet. Amaryl increased for better contro-  , rx'ing UTI 8. Tobacco abuse. NicoDerm patch. Provide counseling.  9. Hyperlipidemia. Zocor 10. Hypokalemia resolved LOS (Days) 15 A FACE TO FACE EVALUATION WAS PERFORMED  Charlett Blake 06/14/2012 8:39 AM

## 2012-06-15 ENCOUNTER — Inpatient Hospital Stay (HOSPITAL_COMMUNITY): Payer: Medicare Other

## 2012-06-15 LAB — GLUCOSE, CAPILLARY: Glucose-Capillary: 134 mg/dL — ABNORMAL HIGH (ref 70–99)

## 2012-06-15 NOTE — Discharge Summary (Signed)
  Priority discharge summary job # [id number]

## 2012-06-15 NOTE — Progress Notes (Signed)
Occupational Therapy Session Note  Patient Details  Name: DALLAS TETTEH MRN: [id number] Date of Birth: 12-15-1950  Today's Date: 06/15/2012 Time: 1300-1330 Time Calculation (min): 30 min  Short Term Goals: Week 2:  OT Short Term Goal 1 (Week 2): STG = LTG OT Short Term Goal 1 - Progress (Week 2): Progressing toward goal  Skilled Therapeutic Interventions: Therapeutic activities with emphasis on functional transfers, functional mobility using RW, endurance, dynamic sitting and standing balance, and UE strengthening. Patient ambulated in ortho gym for 25' with standby and steadying assist: 34' with RW supervised and 10' w/o RW using contact from OT for support.  Patient also completed 10 reps of standing wall push-ups, standing balance activity simulating sudden weight-shifting and dynamic sitting balance activity while seated on therapy ball with OT providing steadying assist.   Patient rated exertion as moderate after 30 min of theract.      Therapy Documentation Precautions:  Precautions Precautions: Fall Precaution Comments: contact precautions-C.diff  Restrictions Weight Bearing Restrictions: No  Pain: Pain Assessment Pain Assessment: 0-10 Pain Score:   2 Pain Type: Other (Comment) (residual pain) Pain Location: Back Pain Orientation: Lower Pain Descriptors: Nagging Pain Onset: On-going Pain Intervention(s): Medication (See eMAR);Distraction  See FIM for current functional status  Therapy/Group: Individual Therapy  Maryan Puls 06/15/2012, 3:16 PM

## 2012-06-15 NOTE — Discharge Summary (Signed)
NAMEJOHAAN, BLEND NO.:  [id number]  MEDICAL RECORD NO.:  [id number]  LOCATION:  [id number]                         FACILITY:  [facility]  PHYSICIAN:  Lauraine Rinne, P.A.  DATE OF BIRTH:  [birth year]  DATE OF ADMISSION:  05/30/2012 DATE OF DISCHARGE:  06/16/2012                              DISCHARGE SUMMARY   DISCHARGE DIAGNOSES: 1. Left thrombotic posterior limb of the internal capsule infarct. 2. Subcutaneous Lovenox for deep vein thrombosis prophylaxis. 3. Clostridium difficile, resolved. 4.  Hypertension. 4. Borderline diabetes mellitus. 5. Tobacco abuse. 6. Hyperlipidemia.  HISTORY:  This is a 62 year old right-handed male, history of borderline diabetes mellitus and tobacco abuse as well as hypertension.  The patient on no scheduled medications prior to admission.  Admitted on May 27, 2012, with right-sided weakness and altered mental status. MRI of the brain showed acute nonhemorrhagic infarct involving the posterior limb of the left internal capsule and portions of the left thalamus as well as slightly older subacute nonhemorrhagic infarcts, anterior left external capsule and remote lacunar infarct.  MRA of the head with moderate small vessel disease, moderate proximal stenosis of the right P1 segment without any other significant proximal stenosis or aneurysm.  Echocardiogram with ejection fraction of [id number], normal systolic function.  Carotid Dopplers with no ICA stenosis.  Maintained on aspirin therapy for stroke prophylaxis as well as the addition of subcutaneous Lovenox for DVT prophylaxis.  Nicoderm patch was introduced for history of tobacco abuse.  There was report of patient having history of fecal incontinence x6 months and worsening lower extremity pain over the last 1-1/2 years.  Workup was ongoing.  MRI of  thoracic, lumbar spine was unremarkable.  There was no obvious cord compression.  HOSPITAL COURSE:  Clostridium difficile specimen  obtained on May 29, 2012,  that was positive.  Flagyl was initiated.  Physical and occupational therapy ongoing.  The patient was admitted for comprehensive rehab program.  PAST MEDICAL HISTORY:  See discharge diagnoses.  SOCIAL HISTORY:  Lives with friends, mobile home,  one-level home, ramp to entrance.  FUNCTIONAL HISTORY:  Prior to admission was independent, driving.  He is on disability.  FUNCTIONAL STATUS:  Upon admission to rehab services, +2 total assist for stand pivot transfers.  Max assist to  ambulate 2 feet with a rolling walker.  PHYSICAL EXAMINATION:  VITAL SIGNS:  Blood pressure 149/89, pulse 73, temperature 97.7, respirations 18. GENERAL:  This was an alert male. HEENT:  Pupils round and reactive to light.  He was able to state his appropriate age and date of birth.  He could not name the hospital or situation in hospital course.  He did follow  simple commands.  He was easily distracted. LUNGS:  Clear to auscultation. CARDIAC:  Regular rate and rhythm. ABDOMEN:  Soft, nontender.  Good bowel sounds.  REHABILITATION HOSPITAL COURSE:  The patient was admitted to Inpatient Rehab Services with therapies initiated on a 3-hour daily basis consisting of physical therapy, occupational therapy, speech therapy, and rehabilitation nursing.  The following issues were addressed during the patient's rehabilitation stay.  Pertaining to Mr. Jasso left thrombotic PLIC infarct remained stable, he continued to participate fully with therapies.  He was maintained on aspirin therapy for stroke prophylaxis as well as subcutaneous Lovenox for DVT prophylaxis.  Blood pressures monitored on no present antihypertensive medication.  Noted borderline diabetes mellitus with a  hemoglobin A1c of 5.3.  He was maintained on Amaryl 4 mg daily.  A Nicoderm patch was ongoing for history of tobacco abuse.  The patient received bedside counseling in regards to cessation of nicotine products.   It was questionable he would be compliant with this request.  Pain management suspect peripheral neuropathy, placed on low-dose Neurontin 100 mg 3 times daily.  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  He had completed a course of Flagyl for Clostridium difficile and no further bouts of loose stools reported.  The patient received weekly collaborative interdisciplinary team conferences to discuss estimated length of stay, family teaching, and any barriers to his discharge.  He was currently modified independent for grooming, supervision lower body dressing, minimal assist for bathing, lower body dressing.  Functional transfers minimal assist.  Supervision to occasional.  Minimal assist to ambulate with a rolling walker  275 feet.  He was able to communicate his needs.  Due to limited assistance at home, it was felt skilled nursing facility was needed with bed becoming available on June 16, 2012  and discharge taking place at that time.  DISCHARGE MEDICATIONS:  At the time of dictation included, hydrocodone 5- 325 mg 1 or 2 tablets every 4 hours as needed pain, aspirin 325 mg p.o. daily, Celexa 10 mg p.o. daily, Voltaren gel apply 4 times daily to affected area, Neurontin 100 mg p.o. t.i.d., Amaryl 4 mg p.o. daily, Nicoderm patch 14 mg daily  taper to 7 mg after 1 month and then continue for 3 more weeks and stop.  Zocor 10 mg p.o. daily.  DIET:  An 1800-calorie ADA diet.  SPECIAL INSTRUCTIONS:  Continue therapies to promote overall mobility and well being.  FOLLOWUP:  The patient would follow up with Dr. Alysia Penna at the outpatient rehab center.     Lauraine Rinne, P.A.     DA/MEDQ  D:  06/15/2012  T:  06/15/2012  Job:  [id number]  cc:   Stoney Bang, MD

## 2012-06-15 NOTE — Progress Notes (Signed)
Patient ID: Eric Scott, male   DOB: 02/16/1951, 62 y.o.   MRN: [id number]  Subjective/Complaints: 62 y.o. right-handed male with history of borderline diabetes mellitus and tobacco abuse as well as hypertension. Patient on no scheduled medications prior to admission. Admitted 05/27/2012 with right-sided weakness and altered mental status. 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 MRA of the head with moderate small vessel disease moderate proximal stenosis of the right P1 segment without any other significant proximal stenosis or aneurysm. Echocardiogram with ejection fraction of 60% and normal systolic function. Carotid Dopplers with no ICA stenosis. Maintained on aspirin therapy for stroke prophylaxis as well as subcutaneous Lovenox for DVT prophylaxis. A NicoDerm patch was introduced for history of tobacco abuse. 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  Review of Systems  Neurological: Positive for sensory change.  All other systems reviewed and are negative.    Objective: Vital Signs: Blood pressure 132/77, pulse 85, temperature 97.8 F (36.6 C), temperature source Oral, resp. rate 18, height 6' (1.829 m), weight 93 kg (205 lb 0.4 oz), SpO2 94.00%. No results found. Results for orders placed during the hospital encounter of 05/30/12 (from the past 72 hour(s))  GLUCOSE,  CAPILLARY     Status: Normal   Collection Time   06/12/12 12:12 PM      Component Value Range Comment   Glucose-Capillary 80  70 - 99 mg/dL   GLUCOSE, CAPILLARY     Status: Abnormal   Collection Time   06/12/12  4:37 PM      Component Value Range Comment   Glucose-Capillary 218 (*) 70 - 99 mg/dL    Comment 1 Notify RN     GLUCOSE, CAPILLARY     Status: Abnormal   Collection Time   06/12/12  9:01 PM      Component Value Range Comment   Glucose-Capillary 228 (*) 70 - 99 mg/dL   GLUCOSE, CAPILLARY     Status: Abnormal   Collection Time   06/13/12  7:08 AM      Component Value Range Comment   Glucose-Capillary 150 (*) 70 - 99 mg/dL    Comment 1 Notify RN     GLUCOSE, CAPILLARY     Status: Abnormal   Collection Time   06/13/12 11:37 AM      Component Value Range Comment   Glucose-Capillary 63 (*) 70 - 99 mg/dL    Comment 1 Notify RN     GLUCOSE, CAPILLARY     Status: Abnormal   Collection Time   06/13/12 12:09 PM      Component Value Range Comment   Glucose-Capillary 106 (*) 70 - 99 mg/dL    Comment 1 Notify RN     GLUCOSE, CAPILLARY     Status: Abnormal   Collection Time   06/13/12  4:34 PM      Component Value Range Comment   Glucose-Capillary 210 (*) 70 - 99 mg/dL  GLUCOSE, CAPILLARY     Status: Normal   Collection Time   06/13/12  9:17 PM      Component Value Range Comment   Glucose-Capillary 84  70 - 99 mg/dL   GLUCOSE, CAPILLARY     Status: Abnormal   Collection Time   06/14/12  7:41 AM      Component Value Range Comment   Glucose-Capillary 121 (*) 70 - 99 mg/dL    Comment 1 Notify RN     GLUCOSE, CAPILLARY     Status: Abnormal   Collection Time   06/14/12 11:50 AM      Component Value Range Comment   Glucose-Capillary 105 (*) 70 - 99 mg/dL   GLUCOSE, CAPILLARY     Status: Abnormal   Collection Time   06/14/12  4:23 PM      Component Value Range Comment   Glucose-Capillary 158 (*) 70 - 99 mg/dL    Comment 1 Notify RN     GLUCOSE, CAPILLARY     Status: Abnormal    Collection Time   06/14/12 10:05 PM      Component Value Range Comment   Glucose-Capillary 179 (*) 70 - 99 mg/dL    Comment 1 Notify RN     GLUCOSE, CAPILLARY     Status: Abnormal   Collection Time   06/15/12  7:31 AM      Component Value Range Comment   Glucose-Capillary 101 (*) 70 - 99 mg/dL    Comment 1 Notify RN        HEENT: normal Cardio: RRR Resp: CTA B/L GI: BS positive Extremity:  No Edema Skin:   Intact Neuro: Alert/Oriented, Abnormal Sensory absent Light touch below knees bilateral and Abnormal Motor 4/5 RUE, 5/5 LUE Musc/Skel:  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 Functional deficits secondary to Left PLIC and thalamic infarct which require 3+ hours per day of interdisciplinary therapy in a comprehensive inpatient rehab setting. Physiatrist is providing close team supervision and 24 hour management of active medical problems listed below. Physiatrist and rehab team continue to assess barriers to discharge/monitor patient progress toward functional and medical goals. FIM: FIM - Bathing Bathing Steps Patient Completed: Chest;Right Arm;Left Arm;Abdomen;Front perineal area;Buttocks;Right upper leg;Left upper leg;Right lower leg (including foot);Left lower leg (including foot) Bathing: 0: Activity did not occur  FIM - Upper Body Dressing/Undressing Upper body dressing/undressing steps patient completed: Thread/unthread right sleeve of pullover shirt/dresss;Thread/unthread left sleeve of pullover shirt/dress;Put head through opening of pull over shirt/dress;Pull shirt over trunk Upper body dressing/undressing: 6: More than reasonable amount of time FIM - Lower Body Dressing/Undressing Lower body dressing/undressing steps patient completed: Thread/unthread right underwear leg;Thread/unthread left underwear leg;Pull underwear up/down;Thread/unthread right pants leg;Thread/unthread left pants leg;Pull pants up/down;Don/Doff right sock;Don/Doff  left sock Lower body dressing/undressing: 5: Set-up assist to: Obtain clothing  FIM - Toileting Toileting steps completed by patient: Adjust clothing prior to toileting;Adjust clothing after toileting;Performs perineal hygiene Toileting Assistive Devices: Grab bar or rail for support Toileting: 4: Steadying assist  FIM - Radio producer Devices: Grab bars Toilet Transfers: 4-To toilet/BSC: Min A (steadying Pt. > 75%)  FIM - Control and instrumentation engineer Devices: Walker;Arm rests Bed/Chair Transfer: 5: Bed > Chair or W/C: Supervision (verbal cues/safety issues);5: Chair or W/C > Bed: Supervision (verbal cues/safety issues)  FIM - Locomotion: Wheelchair Distance: 150 Locomotion: Wheelchair: 6: Travels 150 ft or more, turns around, maneuvers to table, bed or toilet, negotiates 3% grade:  maneuvers on rugs and over door sills independently FIM - Locomotion: Ambulation Locomotion: Ambulation Assistive Devices: Walker - Rolling Ambulation/Gait Assistance: 5: Supervision Locomotion: Ambulation: 5: Travels 150 ft or more with supervision/safety issues  Comprehension Comprehension Mode: Auditory Comprehension: 5-Follows basic conversation/direction: With no assist  Expression Expression Mode: Verbal Expression: 5-Expresses basic needs/ideas: With no assist  Social Interaction Social Interaction: 6-Interacts appropriately with others with medication or extra time (anti-anxiety, antidepressant).  Problem Solving Problem Solving: 5-Solves basic 90% of the time/requires cueing < 10% of the time  Memory Memory: 5-Recognizes or recalls 90% of the time/requires cueing < 10% of the time   Medical Problem List and Plan:  1. Left thrombotic PLIC. infarct  2. DVT Prophylaxis/Anticoagulation: Subcutaneous Lovenox. Monitor platelet counts any signs of bleeding  3. Pain Management: Hydrocodone as needed. Monitor with increased mobility  4.  Neuropsych: This patient is not capable of making decisions on his/her own behalf.  5. Clostridium difficile. Flagyl 500 mg every 8 hours initiated 05/29/2012  6. Hypertension. Hydrochlorothiazide 12.5 mg daily. Monitor the increased mobility  7. Borderline diabetes mellitus. Latest hemoglobin A1c 5.3. Accu-Cheks have been discontinued. But sugars been within acceptable limits  8. Tobacco abuse. NicoDerm patch. Provide counseling.  9. Hyperlipidemia. Zocor   LOS (Days) 16 A FACE TO FACE EVALUATION WAS PERFORMED  KIRSTEINS,ANDREW E 06/15/2012, 8:20 AM

## 2012-06-16 ENCOUNTER — Inpatient Hospital Stay (HOSPITAL_COMMUNITY): Payer: Medicare Other

## 2012-06-16 ENCOUNTER — Encounter (HOSPITAL_COMMUNITY): Payer: Medicare Other | Admitting: *Deleted

## 2012-06-16 DIAGNOSIS — F329 Major depressive disorder, single episode, unspecified: Secondary | ICD-10-CM

## 2012-06-16 DIAGNOSIS — I6330 Cerebral infarction due to thrombosis of unspecified cerebral artery: Secondary | ICD-10-CM

## 2012-06-16 DIAGNOSIS — A0472 Enterocolitis due to Clostridium difficile, not specified as recurrent: Secondary | ICD-10-CM

## 2012-06-16 LAB — GLUCOSE, CAPILLARY

## 2012-06-16 NOTE — Progress Notes (Addendum)
Physical Therapy Discharge Summary  Patient Details  Name: Eric Scott MRN: [id number] Date of Birth: August 23, 1950  Today's Date: 06/16/2012 Time:0905-1000 55 min 0905-1000 Time Calculation (min): 55 min  Patient has met 6 of 7 [place] term goals due to improved activity tolerance, improved balance, improved postural control, increased strength, ability to compensate for deficits, functional use of  right lower extremity, improved attention, improved awareness and improved coordination.  Patient to discharge at an ambulatory level Supervision.   Patient's care partner unavailable to provide the necessary cognitive assistance at discharge to SNF.  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 Pt has worn L   Equipment: No equipment provided  Reasons for discharge: discharge from hospital  Patient/family agrees with progress made and goals achieved: Yes  PT Discharge Precautions/Restrictions Precautions Precautions: Fall Precaution Comments: contact precautions-C.diff  Restrictions Weight Bearing Restrictions: No   Pain Pain Assessment Pain Score:   2 Faces Pain Scale: Hurts a little bit Vision - History Baseline Vision: Wears glasses all the time  Cognition- A and O x 4.  Pt able to state today why he was retired on disability: DM/bil LE neuropathies/LBP Orientation Level: Oriented X4 Sensation Sensation Light Touch: Impaired by gross assessment Coordination Gross Motor Movements are Fluid and Coordinated: No Fine Motor Movements are Fluid and Coordinated: No Coordination and Movement  Description: Upper extremity tremor with activity, bilateral left >right, premorbid per patient Finger Nose Finger Test: undershoots, secondary to weakness and decreased proprioception Motor  Motor Motor: Hemiplegia Motor - Discharge Observations:  (active tremors in BUE)  Mobility Transfers Sit to Stand: 5: Supervision Sit to Stand Details: Visual cues for safe use of DME/AE Stand to Sit: 5: Supervision Stand to Sit Details (indicate cue type and reason): Verbal cues for technique Stand Pivot Transfers: 5: Supervision Stand Pivot Transfer Details: Verbal cues for precautions/safety Stand Pivot Transfer Details (indicate cue type and reason):  (cues for technique) Locomotion  Ambulation Ambulation: Yes Ambulation/Gait Assistance: 5: Supervision Assistive device: Rolling walker, R surgical shoe for healing toe ulcer Ambulation/Gait Assistance Details: Verbal cues for technique Gait Gait: Yes Gait Pattern: Impaired Gait Pattern: Decreased dorsiflexion - right;Decreased hip/knee flexion - right;Narrow base of support;Trunk flexed Stairs / Additional Locomotion Stairs: Yes, flight of 12 Stairs Assistance: 5: Supervision Stairs Assistance Details: Verbal cues for technique;Verbal cues for precautions/safety Stair Management Technique: Two rails;Step to pattern Height of Stairs: 8  Architect: Yes Wheelchair Assistance: 6: Modified independent (Device/Increase time) Environmental health practitioner: Both upper extremities Wheelchair Parts Management: Supervision/cueing  Trunk/Postural Assessment  Cervical Assessment Cervical Assessment: Within Scientist, physiological Assessment: Within Functional Limits Lumbar Assessment Lumbar Assessment: Within Functional Limits Postural Control Postural Control: Deficits on evaluation  Balance Static Sitting Balance Static Sitting - Balance Support: Feet supported Static Sitting - Level of  Assistance: 6: Modified independent (Device/Increase time) Dynamic Sitting Balance Dynamic Sitting - Level of Assistance: 5: Stand by assistance Dynamic Sitting Balance - Compensations:  (can reach dropped items on floor with supervision) Static Standing Balance Static Standing - Balance Support: Right upper extremity supported Static Standing - Level of Assistance: 5: Stand by assistance Extremity  Assessment  RUE Assessment RUE Assessment: Exceptions to [facility] RUE Strength RUE Overall Strength: Within Functional Limits for tasks performed;Deficits LUE Assessment LUE Assessment: Within Functional Limits     See FIM for current functional status  Treatment today: w/c mobility on unit modified independent, including w/c parts management.  W/c>< mat stand/pivot with Rw with close supervision.  Gait x 250' with RW, supervision, on level tile and carpet, cues for upright trunk, forward gaze, = step lengths.   Gait up/down 12 steps using bil rails, supervision, VCs for step-to technique.  Pt rested at top of flight before descending. When pt returned to room, he attempted to transfer back to bed independently.  With supervision, pt transferred w/c> bed.  Therapist reminded pt he still needed supervision for safety.  Pt appeared to believe that he was expected to be independent today as he is d/c'ing to SNF.  Info conveyed to NT.  Discussed pt's need for diabetic shoes, which he owns, but they are not with him.  Consulted SW, who will call pt's friend to see if they can be brought in to CIR, or to SNF.    Annastyn Silvey 06/16/2012, 4:00 PM

## 2012-06-16 NOTE — Progress Notes (Signed)
Occupational Therapy Session Note  Patient Details  Name: Eric Scott MRN: [id number] Date of Birth: 12-Mar-1951  Today's Date: 06/16/2012 Time:  - 0800-0900  (68min)   Short Term Goals: Week 1:  OT Short Term Goal 1 (Week 1): Patient will bathe self with mod assist OT Short Term Goal 1 - Progress (Week 1): Met OT Short Term Goal 2 (Week 1): Patient will transfer to toilet with min assist OT Short Term Goal 2 - Progress (Week 1): Met OT Short Term Goal 3 (Week 1): Patient will transfer to shower bench with min assist OT Short Term Goal 3 - Progress (Week 1): Met OT Short Term Goal 4 (Week 1): Patient will maintain static sitting balance on shower bench with verbal cueing, frequent OT Short Term Goal 4 - Progress (Week 1): Met Week 2:  OT Short Term Goal 1 (Week 2): STG = LTG OT Short Term Goal 1 - Progress (Week 2): Progressing toward goal  Skilled Therapeutic Interventions/Progress Updates:    Pt.lying in bed upon OT arrival.  Pt. Was supervision to get EOB.  Transferred to wc ant then to toilet with SBA and minimal cues to slow down.  Pt. Cleaned self after toileting and transferred to wc then to tub bench.  Pt. Washed Lower leg with crossing over legs and holding to grab bar with one hand.  Pt. Performed lateral leaning for periarea cleaning as well as sit to stand with SBA and grab bar. X2.  Pt. Performed grooming and dressing at sink.  Pt.SOB during session  but able to continue with activities.  Left pt in wc with call bell within reach.   Therapy Documentation Precautions:  Precautions Precautions: Fall Precaution Comments: contact precautions-C.diff  Restrictions Weight Bearing Restrictions: No General: Pain:3/10 back   See FIM for current functional status  Therapy/Group: Individual Therapy  Lisa Roca 06/16/2012, 8:24 AM

## 2012-06-16 NOTE — Progress Notes (Signed)
Occupational Therapy Discharge Summary  Patient Details  Name: Eric Scott MRN: [id number] Date of Birth: [birth year]  Today's Date: 06/16/2012 Time:  -     Patient has met 11 of 11 [place] term goals due to improved activity tolerance, improved balance, postural control, ability to compensate for deficits, functional use of  RIGHT upper extremity, improved attention, improved awareness and improved coordination.  Patient to discharge at overall Supervision level.  Patient's care partner discharged to SNF to provide the necessary physical and cognitive assistance at discharge.    Reasons goals not met: N/A  Recommendation:  Patient will benefit from ongoing skilled OT services in skilled nursing facility setting to continue to advance functional skills in the area of BADL and iADL.  Equipment: No equipment provided  Reasons for discharge: discharge from hospital  Patient/family agrees with progress made and goals achieved: Yes  OT Discharge Precautions/Restrictions  Precautions Precautions: Fall Precaution Comments: contact precautions-C.diff  Restrictions Weight Bearing Restrictions: No       Pain Pain Assessment Pain Assessment: 0-10 Pain Score:   2 Faces Pain Scale: Hurts a little bit Pain Type: Acute pain Pain Location: Back Pain Orientation: Lower Pain Descriptors: Aching Pain Frequency: Occasional Pain Onset: Gradual Patients Stated Pain Goal: 2 Pain Intervention(s): Medication (See eMAR) Multiple Pain Sites: No ADL   Vision/Perception  Vision - History Baseline Vision: Wears glasses all the time  Cognition Orientation Level: Oriented X4 Sensation Sensation Light Touch: Impaired by gross assessment Coordination Gross Motor Movements are Fluid and Coordinated: No Fine Motor Movements are Fluid and Coordinated: No Coordination and Movement Description: Upper extremity tremor with activity, bilateral left >right, premorbid per patient Finger Nose Finger  Test: undershoots, secondary to weakness and decreased proprioception Motor  Motor Motor: Hemiplegia Motor - Discharge Observations:  (active tremors in BUE) Mobility  Transfers Sit to Stand: 5: Supervision Sit to Stand Details: Visual cues for safe use of DME/AE Stand to Sit: 5: Supervision Stand to Sit Details (indicate cue type and reason): Verbal cues for technique  Trunk/Postural Assessment  Cervical Assessment Cervical Assessment: Within Functional Limits Thoracic Assessment Thoracic Assessment: Within Functional Limits Lumbar Assessment Lumbar Assessment: Within Functional Limits Postural Control Postural Control: Deficits on evaluation  Balance Static Sitting Balance Static Sitting - Balance Support: Feet supported Static Sitting - Level of Assistance: 6: Modified independent (Device/Increase time) Dynamic Sitting Balance Dynamic Sitting - Level of Assistance: 5: Stand by assistance Dynamic Sitting Balance - Compensations:  (can reach dropped items on floor with supervision) Static Standing Balance Static Standing - Balance Support: Right upper extremity supported Static Standing - Level of Assistance: 5: Stand by assistance Extremity/Trunk Assessment RUE Assessment RUE Assessment: Exceptions to [facility] RUE Strength:  4/5 RUE Overall Strength: has decreased strength and coordination;Deficits LUE Assessment LUE Assessment: Within Functional Limits  See FIM for current functional status  Lisa Roca 06/16/2012, 2:31 PM

## 2012-06-16 NOTE — Progress Notes (Signed)
Patient discharged to [facility] at 1145. Report called to nurse at [facility] before discharge. Patient given 2 vicodin and 2 units of novolog before discharge. Alease Medina

## 2012-06-16 NOTE — Progress Notes (Signed)
Social Work Discharge Note Discharge Note  The overall goal for the admission was met for:   Discharge location: No-[facility]  Length of Stay: Yes-17 DAYS  Discharge activity level: Yes-SUPERVISION/MIN LEVEL  Home/community participation: Yes  Services provided included: MD, RD, PT, OT, SLP, RN, TR, Pharmacy, Neuropsych and SW  Financial Services: Private Insurance: [facility]  Follow-up services arranged: Other: SHORT TERM NHP  Comments (or additional information):PLAN TO GET STRONGER AND GET HOME SHORTLY NEEDS TO BE MOD/I TO RETURN HOME  Patient/Family verbalized understanding of follow-up arrangements: Yes  Individual responsible for coordination of the follow-up plan: SELF & KAREN-WIFE  Confirmed correct DME delivered: Elease Hashimoto 06/16/2012    Elease Hashimoto

## 2012-06-16 NOTE — Progress Notes (Signed)
Patient ID: Eric Scott, male   DOB: [birth year], 62 y.o.   MRN: [id number]  Subjective/Complaints: 62 y.o. right-handed male with history of borderline diabetes mellitus and tobacco abuse as well as hypertension. Patient on no scheduled medications prior to admission. Admitted 05/27/2012 with right-sided weakness and altered mental status. 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 MRA of the head with moderate small vessel disease moderate proximal stenosis of the right P1 segment without any other significant proximal stenosis or aneurysm. Echocardiogram with ejection fraction of 60% and normal systolic function. Carotid Dopplers with no ICA stenosis. Maintained on aspirin therapy for stroke prophylaxis as well as subcutaneous Lovenox for DVT prophylaxis. A NicoDerm patch was introduced for history of tobacco abuse. 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  Review of Systems  Neurological: Positive for sensory change.  All other systems reviewed and are negative.    Objective: Vital Signs: Blood pressure 132/76, pulse 73, temperature 97.8 F (36.6 C), temperature source Oral, resp. rate 18, height 6' (1.829 m), weight 93 kg (205 lb 0.4 oz), SpO2 100.00%. No results found. Results for orders placed during the hospital encounter of 05/30/12 (from the past 72 hour(s))    GLUCOSE, CAPILLARY     Status: Abnormal   Collection Time   06/13/12 11:37 AM      Component Value Range Comment   Glucose-Capillary 63 (*) 70 - 99 mg/dL    Comment 1 Notify RN     GLUCOSE, CAPILLARY     Status: Abnormal   Collection Time   06/13/12 12:09 PM      Component Value Range Comment   Glucose-Capillary 106 (*) 70 - 99 mg/dL    Comment 1 Notify RN     GLUCOSE, CAPILLARY     Status: Abnormal   Collection Time   06/13/12  4:34 PM      Component Value Range Comment   Glucose-Capillary 210 (*) 70 - 99 mg/dL   GLUCOSE, CAPILLARY     Status: Normal   Collection Time   06/13/12  9:17 PM      Component Value Range Comment   Glucose-Capillary 84  70 - 99 mg/dL   GLUCOSE, CAPILLARY     Status: Abnormal   Collection Time   06/14/12  7:41 AM      Component Value Range Comment   Glucose-Capillary 121 (*) 70 - 99 mg/dL    Comment 1 Notify RN     GLUCOSE, CAPILLARY     Status: Abnormal   Collection Time   06/14/12 11:50 AM      Component Value Range Comment   Glucose-Capillary 105 (*) 70 - 99 mg/dL   GLUCOSE, CAPILLARY     Status: Abnormal   Collection Time   06/14/12  4:23 PM      Component Value Range Comment   Glucose-Capillary 158 (*) 70 - 99 mg/dL    Comment 1 Notify RN  GLUCOSE, CAPILLARY     Status: Abnormal   Collection Time   06/14/12 10:05 PM      Component Value Range Comment   Glucose-Capillary 179 (*) 70 - 99 mg/dL    Comment 1 Notify RN     GLUCOSE, CAPILLARY     Status: Abnormal   Collection Time   06/15/12  7:31 AM      Component Value Range Comment   Glucose-Capillary 101 (*) 70 - 99 mg/dL    Comment 1 Notify RN     GLUCOSE, CAPILLARY     Status: Abnormal   Collection Time   06/15/12 11:48 AM      Component Value Range Comment   Glucose-Capillary 143 (*) 70 - 99 mg/dL    Comment 1 Notify RN     GLUCOSE, CAPILLARY     Status: Abnormal   Collection Time   06/15/12  4:41 PM      Component Value Range Comment   Glucose-Capillary 134 (*) 70 - 99 mg/dL     Comment 1 Notify RN     GLUCOSE, CAPILLARY     Status: Abnormal   Collection Time   06/15/12  9:30 PM      Component Value Range Comment   Glucose-Capillary 160 (*) 70 - 99 mg/dL   GLUCOSE, CAPILLARY     Status: Abnormal   Collection Time   06/16/12  7:12 AM      Component Value Range Comment   Glucose-Capillary 109 (*) 70 - 99 mg/dL    Comment 1 Notify RN        HEENT: normal Cardio: RRR Resp: CTA B/L GI: BS positive Extremity:  No Edema Skin:   Intact Neuro: Alert/Oriented, Abnormal Sensory absent Light touch below knees bilateral and Abnormal Motor 4/5 RUE, 5/5 LUE Musc/Skel:  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 Functional deficits secondary to Left PLIC and thalamic infarct stable for SNF today, PMR f/u in one month   FIM: FIM - Bathing Bathing Steps Patient Completed: Chest;Right Arm;Left Arm;Abdomen;Front perineal area;Buttocks;Right upper leg;Left upper leg;Right lower leg (including foot);Left lower leg (including foot) Bathing: 0: Activity did not occur  FIM - Upper Body Dressing/Undressing Upper body dressing/undressing steps patient completed: Thread/unthread right sleeve of pullover shirt/dresss;Thread/unthread left sleeve of pullover shirt/dress;Put head through opening of pull over shirt/dress;Pull shirt over trunk Upper body dressing/undressing: 6: More than reasonable amount of time FIM - Lower Body Dressing/Undressing Lower body dressing/undressing steps patient completed: Thread/unthread right underwear leg;Thread/unthread left underwear leg;Pull underwear up/down;Thread/unthread right pants leg;Thread/unthread left pants leg;Pull pants up/down;Don/Doff right sock;Don/Doff left sock Lower body dressing/undressing: 5: Set-up assist to: Obtain clothing  FIM - Toileting Toileting steps completed by patient: Adjust clothing prior to toileting;Performs perineal hygiene;Adjust clothing after toileting Toileting Assistive Devices:  Grab bar or rail for support Toileting: 4: Steadying assist  FIM - Radio producer Devices: Grab bars Toilet Transfers: 5-To toilet/BSC: Supervision (verbal cues/safety issues);5-From toilet/BSC: Supervision (verbal cues/safety issues)  FIM - Control and instrumentation engineer Devices: Walker;Arm rests Bed/Chair Transfer: 4: Bed > Chair or W/C: Min A (steadying Pt. > 75%);4: Chair or W/C > Bed: Min A (steadying Pt. > 75%)  FIM - Locomotion: Wheelchair Distance: 150 Locomotion: Wheelchair: 6: Travels 150 ft or more, turns around, maneuvers to table, bed or toilet, negotiates 3% grade: maneuvers on rugs and over door sills independently FIM - Locomotion: Ambulation Locomotion: Ambulation Assistive Devices: Administrator Ambulation/Gait Assistance:  5: Supervision Locomotion: Ambulation: 5: Travels 150 ft or more with supervision/safety issues  Comprehension Comprehension Mode: Auditory Comprehension: 5-Understands complex 90% of the time/Cues < 10% of the time  Expression Expression Mode: Verbal Expression: 5-Expresses basic needs/ideas: With no assist  Social Interaction Social Interaction: 6-Interacts appropriately with others with medication or extra time (anti-anxiety, antidepressant).  Problem Solving Problem Solving: 5-Solves basic problems: With no assist  Memory Memory: 6-More than reasonable amt of time   Medical Problem List and Plan:  1. Left thrombotic PLIC. infarct  2. DVT Prophylaxis/Anticoagulation: Subcutaneous Lovenox. Monitor platelet counts any signs of bleeding  3. Pain Management: Hydrocodone as needed. Monitor with increased mobility  4. Neuropsych: This patient is not capable of making decisions on his/her own behalf.  5. Clostridium difficile. Completed Flagyl monitor recurrence 6. Hypertension. Hydrochlorothiazide 12.5 mg daily. Monitor the increased mobility  7. Borderline diabetes mellitus. Latest  hemoglobin A1c 5.3. Accu-Cheks have been discontinued. But sugars been within acceptable limits  8. Tobacco abuse. NicoDerm patch. Provide counseling.  9. Hyperlipidemia. Zocor   LOS (Days) 17 A FACE TO FACE EVALUATION WAS PERFORMED  Holley Wirt E 06/16/2012, 7:53 AM

## 2012-08-01 ENCOUNTER — Emergency Department (HOSPITAL_COMMUNITY)
Admission: EM | Admit: 2012-08-01 | Discharge: 2012-08-01 | Disposition: A | Discharge status: Home / Self Care | Payer: Medicare Other | Attending: Emergency Medicine | Admitting: Emergency Medicine

## 2012-08-01 ENCOUNTER — Encounter (HOSPITAL_COMMUNITY): Payer: Self-pay | Admitting: Emergency Medicine

## 2012-08-01 ENCOUNTER — Emergency Department (HOSPITAL_COMMUNITY): Payer: Medicare Other

## 2012-08-01 DIAGNOSIS — M79609 Pain in unspecified limb: Secondary | ICD-10-CM | POA: Insufficient documentation

## 2012-08-01 DIAGNOSIS — F17200 Nicotine dependence, unspecified, uncomplicated: Secondary | ICD-10-CM | POA: Insufficient documentation

## 2012-08-01 DIAGNOSIS — R609 Edema, unspecified: Secondary | ICD-10-CM | POA: Insufficient documentation

## 2012-08-01 DIAGNOSIS — R531 Weakness: Secondary | ICD-10-CM

## 2012-08-01 DIAGNOSIS — Z79899 Other long term (current) drug therapy: Secondary | ICD-10-CM | POA: Insufficient documentation

## 2012-08-01 DIAGNOSIS — Z8659 Personal history of other mental and behavioral disorders: Secondary | ICD-10-CM | POA: Insufficient documentation

## 2012-08-01 DIAGNOSIS — E7800 Pure hypercholesterolemia, unspecified: Secondary | ICD-10-CM | POA: Insufficient documentation

## 2012-08-01 DIAGNOSIS — I69998 Other sequelae following unspecified cerebrovascular disease: Secondary | ICD-10-CM | POA: Insufficient documentation

## 2012-08-01 DIAGNOSIS — R0602 Shortness of breath: Secondary | ICD-10-CM | POA: Insufficient documentation

## 2012-08-01 DIAGNOSIS — R5381 Other malaise: Secondary | ICD-10-CM | POA: Insufficient documentation

## 2012-08-01 DIAGNOSIS — Z8739 Personal history of other diseases of the musculoskeletal system and connective tissue: Secondary | ICD-10-CM | POA: Insufficient documentation

## 2012-08-01 DIAGNOSIS — Z7982 Long term (current) use of aspirin: Secondary | ICD-10-CM | POA: Insufficient documentation

## 2012-08-01 DIAGNOSIS — M549 Dorsalgia, unspecified: Secondary | ICD-10-CM | POA: Insufficient documentation

## 2012-08-01 DIAGNOSIS — G8190 Hemiplegia, unspecified affecting unspecified side: Secondary | ICD-10-CM | POA: Insufficient documentation

## 2012-08-01 DIAGNOSIS — E1149 Type 2 diabetes mellitus with other diabetic neurological complication: Secondary | ICD-10-CM | POA: Insufficient documentation

## 2012-08-01 DIAGNOSIS — G8929 Other chronic pain: Secondary | ICD-10-CM | POA: Insufficient documentation

## 2012-08-01 DIAGNOSIS — G909 Disorder of the autonomic nervous system, unspecified: Secondary | ICD-10-CM | POA: Insufficient documentation

## 2012-08-01 DIAGNOSIS — M7989 Other specified soft tissue disorders: Secondary | ICD-10-CM

## 2012-08-01 DIAGNOSIS — I10 Essential (primary) hypertension: Secondary | ICD-10-CM | POA: Insufficient documentation

## 2012-08-01 LAB — CBC WITH DIFFERENTIAL/PLATELET
HCT: 38.5 % — ABNORMAL LOW (ref 39.0–52.0)
Hemoglobin: 13.5 g/dL (ref 13.0–17.0)
MCH: 33.8 pg (ref 26.0–34.0)
MCV: 96.3 fL (ref 78.0–100.0)
RBC: 4 MIL/uL — ABNORMAL LOW (ref 4.22–5.81)
WBC: 8 10*3/uL (ref 4.0–10.5)

## 2012-08-01 LAB — URINALYSIS, ROUTINE W REFLEX MICROSCOPIC
Bilirubin Urine: NEGATIVE
Glucose, UA: NEGATIVE mg/dL
Hgb urine dipstick: NEGATIVE
Protein, ur: 100 mg/dL — AB
Urobilinogen, UA: 0.2 mg/dL (ref 0.0–1.0)

## 2012-08-01 LAB — COMPREHENSIVE METABOLIC PANEL
AST: 15 U/L (ref 0–37)
BUN: 19 mg/dL (ref 6–23)
CO2: 30 mEq/L (ref 19–32)
Calcium: 8.8 mg/dL (ref 8.4–10.5)
Chloride: 105 mEq/L (ref 96–112)
Creatinine, Ser: 1 mg/dL (ref 0.50–1.35)
GFR calc Af Amer: >90 mL/min (ref >90)
GFR calc non Af Amer: 79 mL/min — ABNORMAL LOW (ref >90)
Glucose, Bld: 135 mg/dL — ABNORMAL HIGH (ref 70–99)
Total Bilirubin: 0.4 mg/dL (ref 0.3–1.2)

## 2012-08-01 LAB — URINE MICROSCOPIC-ADD ON

## 2012-08-01 LAB — TROPONIN I: Troponin I: <0.3 ng/mL (ref <0.30)

## 2012-08-01 LAB — PROTIME-INR: Prothrombin Time: 12.4 seconds (ref 11.6–15.2)

## 2012-08-01 NOTE — ED Notes (Signed)
[facility] here to pick up pt.. 

## 2012-08-01 NOTE — ED Notes (Signed)
CSW discussed dcp with pt and his family.  Family reluctant to take pt home; pt reluctant to go home because of his limited mobility.  Pt expressed that he would like to go back to [place].  Explained to pt/family that he did not meet criteria for admission and would be d/c'ed home with [facility].  Family requesting rx for dme (walker, w/c, 3-1) which EDP wrote.  Pt/family encouraged to ask [facility] RN to arrange SW visit to work on placement from home.  CSW arranged PT[facility] transportation.

## 2012-08-01 NOTE — Progress Notes (Signed)
[place] that are Medicare-Certified and affiliated with [facility]  Telephone Number Address  [facility] has ownership interest in this company; however, you are under no obligation to use this agency. [phone]  [address] [place] [address]    Agencies that are Medicare-Certified and are not affiliated with [facility] Telephone Number Address  [facility]y Morel [phone] Fax [phone] [place], [place]  [address]  [facility] [phone] [address] [place], [place] [address]  [facility]  [phone] Fax [phone] [address], [place], [place]  [address]  [facility] [phone] or [phone] [id number] [place], [place] [address]  [facility] [phone] or [phone] [address] [place], [place]  [address]      Agencies that are not Medicare-Certified and are not affiliated with [facility] Telephone Number Address  [facility] [phone] Fax [phone] [address] [place], [place]  [address]  [employer] [facility] [phone] or [phone] Fax [phone] [address] [place], [place]  [address]  [facility]  [phone] [address] [place], [place] [phone] Fax [phone] [address] [place], [place]  [address]  [place]. [phone] Fax [phone] [id number] [place], [place]  [address]  [facility] [phone] [address] [place], [place]  [address]  [facility] on [place] Fax [phone] [address] [place], [name] [address]  [place]. [phone] [address] [place],   [address]  [facility] [phone] Fax [phone] [address] [place],   [address]   In to speak with patient regarding home health needs. Choice of agencies offered to patient. [facility] chosen. Debbie,RN with [facility] notified of referral.

## 2012-08-01 NOTE — ED Notes (Signed)
Social work at bedside to speak with family regarding home health care.

## 2012-08-01 NOTE — Progress Notes (Signed)
   CARE MANAGEMENT ED NOTE 08/01/2012  Patient:  Eric Scott, Eric Scott   Account Number:  [id number]  Date Initiated:  08/01/2012  Documentation initiated by:  Kellie Moor  Subjective/Objective Assessment:   presented to ED with c/o generalized weakness     Subjective/Objective Assessment Detail:     Action/Plan:   requires home health   Action/Plan Detail:   Anticipated DC Date:  08/01/2012     Status Recommendation to Physician:   Result of Recommendation:      [place] [facility]  CM consult   [facility] Choice  HOME HEALTH   Choice offered to / List presented to:  C-1 Patient     [place] arranged  HH-1 RN  [facility].    Status of service:    ED Comments:   ED Comments Detail:  08/01/12-1617-J.Shamon Cothran,RN,BSN [id number]      Spoke with Debbie,RN with [facility]. Referral given.  08/01/12-1606-J.Tavish Gettis,RN,BSN-[phone]      In to speak with paitent regarding home health needs. Reports having been discharged from [facility] recently and was doing well until two days ago. Now c/o generalized weakness. Choice of home health agencies offered to patient. [facility] chosen. Appropriate referral will be made. Requested EDP to order home health RN,PT,and OT.  08/01/12-1603-J.Anastassia Noack,RN,BSN [id number]       Received consult from Dr. Betsey Holiday regarding need for home health.

## 2012-08-01 NOTE — ED Notes (Signed)
Pt just sent home from [facility] on 2/18 for stroke on 05/26/12. Now having increasing weakness and diff walking x 2 days no h/a no dizziness has hax of weakness in left sidfe from previous stroke. Pt states just has diff walking

## 2012-08-01 NOTE — ED Notes (Signed)
Pt dc to home.  Pt taken by PTAR by stretcher.  Pt a/o x4.  Pt and family states understanding to dc instructions.  P

## 2012-08-01 NOTE — ED Notes (Signed)
Pt's son called RN voicing concern that his father (the pt) was going to be discharged home. Stated that he was on the way to the ED to talk with the MD regarding discharge. Son stated that father was too weak to go home.  RN spoke with Dr. Audie Pinto regarding son's concern. EDP requested that social work be involved. RN called Education officer, museum and informed her of pt and son's concern that pt was going to be discharged home.

## 2012-08-01 NOTE — ED Provider Notes (Signed)
History     CSN: [id number]  Arrival date & time 08/01/12  1003   First MD Initiated Contact with Patient 08/01/12 1010      No chief complaint on file.   (Consider location/radiation/quality/duration/timing/severity/associated sxs/prior treatment) HPI Comments: Patient brought to the emergency department by EMS for evaluation of generalized weakness, shortness of breath with lower extremity edema. Patient had a recent CVA with resultant right hemiparesis. He has not had any new focal weakness, numbness or tingling. Patient denies chest pain. He has been mildly short of breath. He has not noticed any orthopnea.symptoms have developed over a period of approximately a week or more. It has progressively worsened. Patient has had difficulty with ambulation because of the generalized weakness, has had several ground-level falls. Denies headache, head injury with loss of consciousness, neck pain, back pain.   Past Medical History  Diagnosis Date  . Hypertension   . Hypercholesteremia   . Type II diabetes mellitus   . Diabetic peripheral neuropathy     "chronic" (05/27/2012)  . Stroke 05/2012    Subacute, lacunar infarcts within the left basal ganglia and posterior limp of the left internal capsule/thalamus; "RUE; both feet weak" (05/27/2012)  . Anxiety   . Spinal stenosis     mild lumbar (MRI 05/2012)-L2-L3 to L4-L5 , mild lumbar foraminal stenosis   . Chronic pain     legs, back; MRI 05/2012 with mild thoracic degenerative changes no spinal stenosis     Past Surgical History  Procedure Laterality Date  . Hernia repair  01/05/2004    "belly button" (05/27/2012)    No family history on file.  History  Substance Use Topics  . Smoking status: Current Every Day Smoker -- 2.00 packs/day for 45 years    Types: Cigarettes  . Smokeless tobacco: Never Used  . Alcohol Use: No     Comment: 05/27/2012 "drank gallons and gallons 20 yr ago or so; last drink  at least 10 yr ago"       Review of Systems  Constitutional: Negative for fever.  Respiratory: Positive for shortness of breath.   Cardiovascular: Positive for leg swelling. Negative for chest pain and palpitations.  Neurological: Positive for weakness.  All other systems reviewed and are negative.    Allergies  Flexeril  Home Medications   Current Outpatient Rx  Name  Route  Sig  Dispense  Refill  . aspirin 325 MG tablet   Oral   Take 1 tablet (325 mg total) by mouth daily.   30 tablet   1   . hydrochlorothiazide (MICROZIDE) 12.5 MG capsule   Oral   Take 1 capsule (12.5 mg total) by mouth daily.   30 capsule   1   . nicotine (NICODERM CQ - DOSED IN MG/24 HOURS) 21 mg/24hr patch   Transdermal   Place 1 patch onto the skin daily.   28 patch   0   . potassium chloride SA (K-DUR,KLOR-CON) 20 MEQ tablet   Oral   Take 2 tablets (40 mEq total) by mouth every 4 (four) hours.         . potassium chloride SA (K-DUR,KLOR-CON) 20 MEQ tablet   Oral   Take 2 tablets (40 mEq total) by mouth 2 (two) times daily.         . pravastatin (PRAVACHOL) 10 MG tablet   Oral   Take 1 tablet (10 mg total) by mouth daily.   30 tablet   1     There were  no vitals taken for this visit.  Physical Exam  Constitutional: He is oriented to person, place, and time. He appears well-developed and well-nourished. No distress.  HENT:  Head: Normocephalic and atraumatic.  Eyes: Conjunctivae are normal. Pupils are equal, round, and reactive to light.  Neck: Normal range of motion. Neck supple.  Cardiovascular: Normal rate, regular rhythm and normal heart sounds.   Pulmonary/Chest: Effort normal and breath sounds normal. No respiratory distress. He has no wheezes. He has no rales. He exhibits no tenderness.  Abdominal: Soft. He exhibits no distension. There is no tenderness.  Musculoskeletal:       Left ankle: He exhibits swelling.  Atraumatic extremities Baseline right hemiparesis 2+pitting edema RLE   Neurological: He is alert and oriented to person, place, and time. No cranial nerve deficit. He exhibits abnormal muscle tone. GCS eye subscore is 4. GCS verbal subscore is 5. GCS motor subscore is 6.  Right hemiparesis  Skin: Skin is warm and dry.    ED Course  Procedures (including critical care time)   Date: 08/01/2012  Rate: 69  Rhythm: normal sinus rhythm and premature ventricular contractions (PVC)  QRS Axis: left  Intervals: normal  ST/T Wave abnormalities: normal  Conduction Disutrbances:none  Narrative Interpretation:   Old EKG Reviewed: unchanged    Labs Reviewed  CBC WITH DIFFERENTIAL - Abnormal; Notable for the following:    RBC 4.00 (*)    HCT 38.5 (*)    All other components within normal limits  COMPREHENSIVE METABOLIC PANEL - Abnormal; Notable for the following:    Glucose, Bld 135 (*)    Albumin 3.2 (*)    GFR calc non Af Amer 79 (*)    All other components within normal limits  PRO B NATRIURETIC PEPTIDE - Abnormal; Notable for the following:    Pro B Natriuretic peptide (BNP) 231.6 (*)    All other components within normal limits  URINALYSIS, ROUTINE W REFLEX MICROSCOPIC - Abnormal; Notable for the following:    Protein, ur 100 (*)    All other components within normal limits  URINE MICROSCOPIC-ADD ON - Abnormal; Notable for the following:    Casts GRANULAR CAST (*)    All other components within normal limits  TROPONIN I  PROTIME-INR   Dg Chest 2 View  08/01/2012  *RADIOLOGY REPORT*  Clinical Data: Extremity edema and shortness of breath.  CHEST - 2 VIEW  Comparison: Two-view chest 05/27/2012.  Findings: The heart size is upper limits of normal.  Mild pulmonary vascular congestion has increased since the prior study.  Mild bibasilar airspace disease is worse on the right.  The visualized soft tissues and bony thorax are unremarkable.  IMPRESSION:  1.  Borderline cardiomegaly and increased pulmonary vascular congestion. 2.  Mild bibasilar airspace  disease, worse on the right.  Although this likely reflects atelectasis, early infection is not excluded.   Original Report Authenticated By: San Morelle, M.D.      Diagnosis: generalized weakness; peripheral edema    MDM  Patient comes to the ER for evaluation of generalized weakness. Symptoms have been present for 2 days. Patient had a recent stroke and was in rehabilitation for 2 months, just recently discharged. Patient has right leg edema, pitting in nature. This is out of proportionslight swelling on the other side. Duplex was performed to rule out DVT and was negative. The rest of the patient's workup was also unremarkable. Patient's transient generalized weakness of unclear etiology. I have not found any diagnosis that would  require admission. Patient is currently at home, has now been home nursing or other resources. Care management consult to evaluate patient's home needs.        Orpah Greek, MD 08/01/12 [phone]

## 2012-09-08 ENCOUNTER — Ambulatory Visit: Payer: Self-pay | Admitting: Nurse Practitioner

## 2012-11-17 ENCOUNTER — Emergency Department (HOSPITAL_COMMUNITY)
Admission: EM | Admit: 2012-11-17 | Discharge: 2012-11-17 | Disposition: A | Discharge status: Home / Self Care | Payer: Medicare Other | Attending: Emergency Medicine | Admitting: Emergency Medicine

## 2012-11-17 ENCOUNTER — Encounter (HOSPITAL_COMMUNITY): Payer: Self-pay

## 2012-11-17 ENCOUNTER — Emergency Department (HOSPITAL_COMMUNITY): Payer: Medicare Other

## 2012-11-17 DIAGNOSIS — G8929 Other chronic pain: Secondary | ICD-10-CM | POA: Insufficient documentation

## 2012-11-17 DIAGNOSIS — Z79899 Other long term (current) drug therapy: Secondary | ICD-10-CM | POA: Insufficient documentation

## 2012-11-17 DIAGNOSIS — E7800 Pure hypercholesterolemia, unspecified: Secondary | ICD-10-CM | POA: Insufficient documentation

## 2012-11-17 DIAGNOSIS — Z8673 Personal history of transient ischemic attack (TIA), and cerebral infarction without residual deficits: Secondary | ICD-10-CM | POA: Insufficient documentation

## 2012-11-17 DIAGNOSIS — F329 Major depressive disorder, single episode, unspecified: Secondary | ICD-10-CM | POA: Insufficient documentation

## 2012-11-17 DIAGNOSIS — R609 Edema, unspecified: Secondary | ICD-10-CM

## 2012-11-17 DIAGNOSIS — F3289 Other specified depressive episodes: Secondary | ICD-10-CM | POA: Insufficient documentation

## 2012-11-17 DIAGNOSIS — E1149 Type 2 diabetes mellitus with other diabetic neurological complication: Secondary | ICD-10-CM | POA: Insufficient documentation

## 2012-11-17 DIAGNOSIS — E1142 Type 2 diabetes mellitus with diabetic polyneuropathy: Secondary | ICD-10-CM | POA: Insufficient documentation

## 2012-11-17 DIAGNOSIS — F17200 Nicotine dependence, unspecified, uncomplicated: Secondary | ICD-10-CM | POA: Insufficient documentation

## 2012-11-17 DIAGNOSIS — I10 Essential (primary) hypertension: Secondary | ICD-10-CM | POA: Insufficient documentation

## 2012-11-17 DIAGNOSIS — Z7982 Long term (current) use of aspirin: Secondary | ICD-10-CM | POA: Insufficient documentation

## 2012-11-17 DIAGNOSIS — Z8739 Personal history of other diseases of the musculoskeletal system and connective tissue: Secondary | ICD-10-CM | POA: Insufficient documentation

## 2012-11-17 DIAGNOSIS — F411 Generalized anxiety disorder: Secondary | ICD-10-CM | POA: Insufficient documentation

## 2012-11-17 LAB — COMPREHENSIVE METABOLIC PANEL
ALT: 8 U/L (ref 0–53)
AST: 10 U/L (ref 0–37)
Alkaline Phosphatase: 81 U/L (ref 39–117)
CO2: 32 mEq/L (ref 19–32)
Chloride: 102 mEq/L (ref 96–112)
GFR calc Af Amer: 75 mL/min — ABNORMAL LOW (ref >90)
GFR calc non Af Amer: 65 mL/min — ABNORMAL LOW (ref >90)
Glucose, Bld: 200 mg/dL — ABNORMAL HIGH (ref 70–99)
Potassium: 3.3 mEq/L — ABNORMAL LOW (ref 3.5–5.1)
Sodium: 142 mEq/L (ref 135–145)
Total Bilirubin: 0.2 mg/dL — ABNORMAL LOW (ref 0.3–1.2)

## 2012-11-17 LAB — CBC WITH DIFFERENTIAL/PLATELET
Eosinophils Relative: 6 % — ABNORMAL HIGH (ref 0–5)
Lymphocytes Relative: 30 % (ref 12–46)
Lymphs Abs: 2.5 10*3/uL (ref 0.7–4.0)
MCV: 95.2 fL (ref 78.0–100.0)
Neutro Abs: 4.6 10*3/uL (ref 1.7–7.7)
Neutrophils Relative %: 56 % (ref 43–77)
Platelets: 203 10*3/uL (ref 150–400)
RBC: 4.54 MIL/uL (ref 4.22–5.81)
WBC: 8.3 10*3/uL (ref 4.0–10.5)

## 2012-11-17 LAB — URINALYSIS, ROUTINE W REFLEX MICROSCOPIC
Bilirubin Urine: NEGATIVE
Glucose, UA: NEGATIVE mg/dL
Hgb urine dipstick: NEGATIVE
Protein, ur: NEGATIVE mg/dL
Urobilinogen, UA: 0.2 mg/dL (ref 0.0–1.0)

## 2012-11-17 NOTE — ED Notes (Signed)
Patient unable to void at this time

## 2012-11-17 NOTE — ED Notes (Signed)
Pt here from [facility]

## 2012-11-17 NOTE — ED Notes (Signed)
Sleeping soundly 

## 2012-11-17 NOTE — ED Provider Notes (Signed)
History  This chart was scribed for Eric Acosta, MD by Era Bumpers, ED scribe. This patient was seen in room APA19/APA19 and the patient's care was started at 1808.   CSN: [id number]  Arrival date & time      None     Chief Complaint  Patient presents with  . Leg Swelling   Patient is a 62 y.o. male presenting with extremity pain. The history is provided by the patient. No language interpreter was used.  Extremity Pain This is a new problem. The current episode started 2 days ago. The problem occurs constantly. The problem has been gradually worsening. Pertinent negatives include no chest pain, no abdominal pain and no shortness of breath. Nothing aggravates the symptoms. Nothing relieves the symptoms. He has tried nothing for the symptoms.   HPI Comments: Eric Scott is a 62 y.o. male who presents to the Emergency Department complaining of constant gradually worsening bilateral lower leg/ankle/foot swelling over the past 2 days. He states increased swelling to lower legs compared to baseline. He denies SOB, cough and he states sleeps elevated in a hospital bed at home. He is baseline nonambulatory to previous CVA and uses a wheelchair. No urinary sx, eating /  Drinking normal.    Past Medical History  Diagnosis Date  . Hypertension   . Hypercholesteremia   . Type II diabetes mellitus   . Diabetic peripheral neuropathy     "chronic" (05/27/2012)  . Stroke 05/2012    Subacute, lacunar infarcts within the left basal ganglia and posterior limp of the left internal capsule/thalamus; "RUE; both feet weak" (05/27/2012)  . Anxiety   . Spinal stenosis     mild lumbar (MRI 05/2012)-L2-L3 to L4-L5 , mild lumbar foraminal stenosis   . Chronic pain     legs, back; MRI 05/2012 with mild thoracic degenerative changes no spinal stenosis   . Depression     Past Surgical History  Procedure Laterality Date  . Hernia repair  01/05/2004    "belly button" (05/27/2012)  . Hernia repair   01/05/2004    No family history on file.  History  Substance Use Topics  . Smoking status: Current Every Day Smoker -- 2.00 packs/day for 45 years    Types: Cigarettes  . Smokeless tobacco: Never Used  . Alcohol Use: No     Comment: 05/27/2012 "drank gallons and gallons 20 yr ago or so; last drink  at least 10 yr ago"      Review of Systems  Constitutional: Negative for fever and chills.  Respiratory: Negative for cough and shortness of breath.   Cardiovascular: Positive for leg swelling. Negative for chest pain.  Gastrointestinal: Negative for nausea, vomiting and abdominal pain.  Neurological: Negative for weakness.  All other systems reviewed and are negative.  A complete 10 system review of systems was obtained and all systems are negative except as noted in the HPI and PMH.    Allergies  Cucumber extract; Flexeril; and Kiwi extract  Home Medications   Current Outpatient Rx  Name  Route  Sig  Dispense  Refill  . aspirin EC 81 MG tablet   Oral   Take 81 mg by mouth daily.         . citalopram (CELEXA) 10 MG tablet   Oral   Take 10 mg by mouth daily.         . diazepam (VALIUM) 10 MG tablet   Oral   Take 10 mg by mouth 3 (three)  times daily.         . fish oil-omega-3 fatty acids 1000 MG capsule   Oral   Take 1 g by mouth 2 (two) times daily.         . furosemide (LASIX) 40 MG tablet   Oral   Take 40 mg by mouth daily.         Marland Kitchen gabapentin (NEURONTIN) 800 MG tablet   Oral   Take 800 mg by mouth 3 (three) times daily.         Marland Kitchen glimepiride (AMARYL) 4 MG tablet   Oral   Take 4 mg by mouth daily.          Marland Kitchen HYDROcodone-acetaminophen (NORCO) 10-325 MG per tablet   Oral   Take 1 tablet by mouth 3 (three) times daily.         . simvastatin (ZOCOR) 10 MG tablet   Oral   Take 10 mg by mouth at bedtime.           Triage Vitals: BP 131/67  Pulse 79  Temp(Src) 97.4 F (36.3 C) (Oral)  Resp 23  SpO2 96%  Physical Exam  Nursing  note and vitals reviewed. Constitutional: He is oriented to person, place, and time. He appears well-developed and well-nourished. No distress.  HENT:  Head: Normocephalic and atraumatic.  Eyes: EOM are normal.  Neck: Neck supple. No tracheal deviation present.  Cardiovascular: Normal rate, regular rhythm and normal heart sounds.   No murmur heard. No jvd. Normal pedal pulses. No murmurs.   Pulmonary/Chest: Effort normal and breath sounds normal. No respiratory distress. He has no wheezes.  Abdominal: Soft. Bowel sounds are normal. He exhibits no distension. There is no tenderness.  Musculoskeletal: Normal range of motion. He exhibits edema. He exhibits no tenderness.  Bilateral 2+ pitting edema, no asymmetry from knees down.       Neurological: He is alert and oriented to person, place, and time.  Skin: Skin is warm and dry.  Psychiatric: He has a normal mood and affect. His behavior is normal.    ED Course  Procedures (including critical care time)  ED ECG REPORT  I personally interpreted this EKG   Date: 11/17/2012   Rate: 67  Rhythm: normal sinus rhythm  QRS Axis: left  Intervals: normal  ST/T Wave abnormalities: normal  Conduction Disutrbances:left anterior fascicular block  Narrative Interpretation: PRWP  Old EKG Reviewed: c/2 08/01/12, no sig changes   DIAGNOSTIC STUDIES: Oxygen Saturation is 96% on room air, normal by my interpretation.    COORDINATION OF CARE: At 605 PM Discussed treatment plan with patient which includes CXR, blood work, EKG, UA. Patient agrees.   Labs Reviewed  CBC WITH DIFFERENTIAL - Abnormal; Notable for the following:    Eosinophils Relative 6 (*)    All other components within normal limits  COMPREHENSIVE METABOLIC PANEL - Abnormal; Notable for the following:    Potassium 3.3 (*)    Glucose, Bld 200 (*)    Albumin 3.3 (*)    Total Bilirubin 0.2 (*)    GFR calc non Af Amer 65 (*)    GFR calc Af Amer 75 (*)    All other components  within normal limits  PRO B NATRIURETIC PEPTIDE - Abnormal; Notable for the following:    Pro B Natriuretic peptide (BNP) 169.3 (*)    All other components within normal limits  URINALYSIS, ROUTINE W REFLEX MICROSCOPIC   Dg Chest Port 1 View  11/17/2012   *  RADIOLOGY REPORT*  Clinical Data: Chest pain and bilateral lower extremity swelling.  PORTABLE CHEST - 1 VIEW  Comparison: Chest radiograph 08/01/2012 and 05/27/2012  Findings: Cardiac silhouette appears enlarged and stable. Mediastinal hilar contours are normal.  Pulmonary vascularity is slightly increased.  There is slight interstitial prominence at the lung bases.  No definite airspace disease.  No visible pleural effusion.  No pneumothorax.  Trachea is midline.  Bones are unremarkable.  IMPRESSION: Stable cardiomegaly and slight pulmonary vascular congestion.  Mild interstitial prominence at the lung bases appears similar to prior chest radiographs.  This could be chronic, or could reflect mild interstitial pulmonary edema.   Original Report Authenticated By: Curlene Dolphin, M.D.     1. Peripheral edema       MDM  Pt with swelling of unknown etiology - he is not active, spends most of his day in bed and has no other s/s of CHF,  Labs, CXR.  Labs normal, pt well appaering  compresssion stockings, increased lasix X 2 days, f/u with PMD.  Meds given in ED:  Medications - No data to display  New Prescriptions   No medications on file       I personally performed the services described in this documentation, which was scribed in my presence. The recorded information has been reviewed and is accurate.           Eric Acosta, MD 11/17/12 2014

## 2012-11-17 NOTE — ED Notes (Signed)
For discharge home to [facility], EMS called for transport home

## 2012-11-17 NOTE — ED Notes (Signed)
Complain of legs and feet swelling for two weeks

## 2013-02-05 ENCOUNTER — Emergency Department (HOSPITAL_COMMUNITY): Payer: Medicare Other

## 2013-02-05 ENCOUNTER — Encounter (HOSPITAL_COMMUNITY): Payer: Self-pay | Admitting: Emergency Medicine

## 2013-02-05 ENCOUNTER — Emergency Department (HOSPITAL_COMMUNITY)
Admission: EM | Admit: 2013-02-05 | Discharge: 2013-02-05 | Disposition: A | Discharge status: Home / Self Care | Payer: Medicare Other | Attending: Emergency Medicine | Admitting: Emergency Medicine

## 2013-02-05 DIAGNOSIS — Z8673 Personal history of transient ischemic attack (TIA), and cerebral infarction without residual deficits: Secondary | ICD-10-CM | POA: Insufficient documentation

## 2013-02-05 DIAGNOSIS — Y9210 Unspecified residential institution as the place of occurrence of the external cause: Secondary | ICD-10-CM | POA: Insufficient documentation

## 2013-02-05 DIAGNOSIS — L03116 Cellulitis of left lower limb: Secondary | ICD-10-CM

## 2013-02-05 DIAGNOSIS — Z8739 Personal history of other diseases of the musculoskeletal system and connective tissue: Secondary | ICD-10-CM | POA: Insufficient documentation

## 2013-02-05 DIAGNOSIS — F17200 Nicotine dependence, unspecified, uncomplicated: Secondary | ICD-10-CM | POA: Insufficient documentation

## 2013-02-05 DIAGNOSIS — Y9389 Activity, other specified: Secondary | ICD-10-CM | POA: Insufficient documentation

## 2013-02-05 DIAGNOSIS — L02419 Cutaneous abscess of limb, unspecified: Secondary | ICD-10-CM | POA: Insufficient documentation

## 2013-02-05 DIAGNOSIS — Z79899 Other long term (current) drug therapy: Secondary | ICD-10-CM | POA: Insufficient documentation

## 2013-02-05 DIAGNOSIS — R296 Repeated falls: Secondary | ICD-10-CM | POA: Insufficient documentation

## 2013-02-05 DIAGNOSIS — S8990XA Unspecified injury of unspecified lower leg, initial encounter: Secondary | ICD-10-CM | POA: Insufficient documentation

## 2013-02-05 DIAGNOSIS — E1149 Type 2 diabetes mellitus with other diabetic neurological complication: Secondary | ICD-10-CM | POA: Insufficient documentation

## 2013-02-05 DIAGNOSIS — E1142 Type 2 diabetes mellitus with diabetic polyneuropathy: Secondary | ICD-10-CM | POA: Insufficient documentation

## 2013-02-05 DIAGNOSIS — Z7982 Long term (current) use of aspirin: Secondary | ICD-10-CM | POA: Insufficient documentation

## 2013-02-05 DIAGNOSIS — F411 Generalized anxiety disorder: Secondary | ICD-10-CM | POA: Insufficient documentation

## 2013-02-05 DIAGNOSIS — I10 Essential (primary) hypertension: Secondary | ICD-10-CM | POA: Insufficient documentation

## 2013-02-05 DIAGNOSIS — F3289 Other specified depressive episodes: Secondary | ICD-10-CM | POA: Insufficient documentation

## 2013-02-05 DIAGNOSIS — F329 Major depressive disorder, single episode, unspecified: Secondary | ICD-10-CM | POA: Insufficient documentation

## 2013-02-05 DIAGNOSIS — J441 Chronic obstructive pulmonary disease with (acute) exacerbation: Secondary | ICD-10-CM | POA: Insufficient documentation

## 2013-02-05 DIAGNOSIS — R609 Edema, unspecified: Secondary | ICD-10-CM

## 2013-02-05 DIAGNOSIS — S8991XA Unspecified injury of right lower leg, initial encounter: Secondary | ICD-10-CM

## 2013-02-05 DIAGNOSIS — E119 Type 2 diabetes mellitus without complications: Secondary | ICD-10-CM

## 2013-02-05 DIAGNOSIS — E7800 Pure hypercholesterolemia, unspecified: Secondary | ICD-10-CM | POA: Insufficient documentation

## 2013-02-05 DIAGNOSIS — G8929 Other chronic pain: Secondary | ICD-10-CM | POA: Insufficient documentation

## 2013-02-05 LAB — URINALYSIS, ROUTINE W REFLEX MICROSCOPIC
Bilirubin Urine: NEGATIVE
Glucose, UA: NEGATIVE mg/dL
Ketones, ur: NEGATIVE mg/dL
Protein, ur: NEGATIVE mg/dL
pH: 6.5 (ref 5.0–8.0)

## 2013-02-05 LAB — BASIC METABOLIC PANEL
BUN: 14 mg/dL (ref 6–23)
CO2: 33 mEq/L — ABNORMAL HIGH (ref 19–32)
Calcium: 9.7 mg/dL (ref 8.4–10.5)
GFR calc non Af Amer: 73 mL/min — ABNORMAL LOW (ref >90)
Glucose, Bld: 86 mg/dL (ref 70–99)

## 2013-02-05 LAB — CBC WITH DIFFERENTIAL/PLATELET
Basophils Relative: 0 % (ref 0–1)
Eosinophils Absolute: 0.3 10*3/uL (ref 0.0–0.7)
Eosinophils Relative: 3 % (ref 0–5)
Hemoglobin: 15.4 g/dL (ref 13.0–17.0)
Lymphs Abs: 1.9 10*3/uL (ref 0.7–4.0)
MCH: 33.2 pg (ref 26.0–34.0)
MCHC: 34.1 g/dL (ref 30.0–36.0)
MCV: 97.2 fL (ref 78.0–100.0)
Monocytes Relative: 10 % (ref 3–12)
Neutrophils Relative %: 71 % (ref 43–77)
Platelets: 210 10*3/uL (ref 150–400)
RBC: 4.64 MIL/uL (ref 4.22–5.81)

## 2013-02-05 LAB — URINE MICROSCOPIC-ADD ON

## 2013-02-05 MED ORDER — VANCOMYCIN HCL IN DEXTROSE 1-5 GM/200ML-% IV SOLN
1000.0000 mg | Freq: Once | INTRAVENOUS | Status: AC
  Administered 2013-02-05: 1000 mg via INTRAVENOUS
  Filled 2013-02-05: qty 200

## 2013-02-05 MED ORDER — HYDROCODONE-ACETAMINOPHEN 7.5-325 MG PO TABS: 1.0000 | ORAL_TABLET | Freq: Four times a day (QID) | ORAL | Status: DC | PRN

## 2013-02-05 MED ORDER — ALBUTEROL SULFATE (5 MG/ML) 0.5% IN NEBU
2.5000 mg | INHALATION_SOLUTION | Freq: Once | RESPIRATORY_TRACT | Status: AC
  Administered 2013-02-05: 2.5 mg via RESPIRATORY_TRACT
  Filled 2013-02-05: qty 0.5

## 2013-02-05 MED ORDER — OXYCODONE-ACETAMINOPHEN 5-325 MG PO TABS
1.0000 | ORAL_TABLET | Freq: Once | ORAL | Status: AC
  Administered 2013-02-05: 1 via ORAL
  Filled 2013-02-05: qty 1

## 2013-02-05 MED ORDER — CEFUROXIME AXETIL 500 MG PO TABS: 500.0000 mg | ORAL_TABLET | Freq: Two times a day (BID) | ORAL | Status: DC

## 2013-02-05 MED ORDER — ONDANSETRON HCL 4 MG PO TABS
4.0000 mg | ORAL_TABLET | Freq: Once | ORAL | Status: AC
  Administered 2013-02-05: 4 mg via ORAL
  Filled 2013-02-05: qty 1

## 2013-02-05 MED ORDER — BACITRACIN-NEOMYCIN-POLYMYXIN 400-5-5000 EX OINT
TOPICAL_OINTMENT | CUTANEOUS | Status: AC
  Administered 2013-02-05: 1
  Filled 2013-02-05: qty 1

## 2013-02-05 MED ORDER — DOXYCYCLINE HYCLATE 100 MG PO CAPS: 100.0000 mg | ORAL_CAPSULE | Freq: Two times a day (BID) | ORAL | Status: AC

## 2013-02-05 MED ORDER — IPRATROPIUM BROMIDE 0.02 % IN SOLN
0.5000 mg | Freq: Once | RESPIRATORY_TRACT | Status: AC
  Administered 2013-02-05: 0.5 mg via RESPIRATORY_TRACT
  Filled 2013-02-05: qty 2.5

## 2013-02-05 NOTE — Consult Note (Signed)
[facility] Medical Consultation  Eric Scott [name] DOB: [birth year] DOA: 02/05/2013 PCP: [name], MD   Requesting physician: Dr. Thurnell Garbe, ER. Date of consultation: 02/05/2013. Reason for consultation: Left leg redness.  Impression/Recommendations    1. Mild left lower leg cellulitis. Although the patient has slightly elevated white blood cell count, he does not look septic, I think that this can be managed as an outpatient with high-dose Levaquin 750 mg daily for a 10 day course. He tells me that he has had this problem for approximately 3 weeks and it has not particularly gotten worse. He has not been given any oral antibiotics prior to coming into the emergency room. Based on his clinical presentation, laboratory findings, I do not think he requires admission to the hospital at this stage. Obviously, should he become worse, he can come to the emergency room again. 2. Type 2 diabetes mellitus. Blood glucose control in the emergency room. 3. 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 4. Diabetic peripheral neuropathy. This is chronic. 5. Hypertension. 6. Morbid obesity.   Chief Complaint: Fall, right foot pain.  HPI:  This is a 62 year old man who is a resident of a local nursing facility presents with having had a fall approximately 2 days ago when his clinical legs "gave way". He noticed increased swelling of the right lower extremity. This has been investigated in the emergency room there is no acute problem. However it was felt that he had cellulitis in the left lower leg and we were asked to see the patient. The patient himself describes that this has been present for approximately 3 weeks and he has not received any antibiotics as of yet. He did not describe any fever, drowsiness, chest pain or cough. He apparently was wheezing when he came to the emergency room but he is no longer doing this.  Review of Systems:  Apart from history of  present illness, other systems negative.  Past Medical History  Diagnosis Date  . Hypertension   . Hypercholesteremia   . Type II diabetes mellitus   . Diabetic peripheral neuropathy     "chronic" (05/27/2012)  . Stroke 05/2012    Subacute, lacunar infarcts within the left basal ganglia and posterior limp of the left internal capsule/thalamus; "RUE; both feet weak" (05/27/2012)  . Anxiety   . Spinal stenosis     mild lumbar (MRI 05/2012)-L2-L3 to L4-L5 , mild lumbar foraminal stenosis   . Chronic pain     legs, back; MRI 05/2012 with mild thoracic degenerative changes no spinal stenosis   . Depression    Past Surgical History  Procedure Laterality Date  . Hernia repair  01/05/2004    "belly button" (05/27/2012)  . Hernia repair  01/05/2004   Social History:  reports that he has been smoking Cigarettes.  He has a 90 pack-year smoking history. He has never used smokeless tobacco. He reports that he does not drink alcohol or use illicit drugs.  Allergies  Allergen Reactions  . Cucumber Extract Other (See Comments)    "Fells like I'm having a heart attack"  . Flexeril [Cyclobenzaprine Hcl] Other (See Comments)    "whole body  Tremors" (05/27/2012)  . Kiwi Extract Other (See Comments)    "feels like I'm having a heart attack"   History reviewed. No pertinent family history.  Prior to Admission medications   Medication Sig Start Date End Date Taking? Authorizing Provider  aspirin EC 81 MG tablet Take 81 mg by  mouth daily.   Yes Historical Provider, MD  atorvastatin (LIPITOR) 20 MG tablet Take 20 mg by mouth at bedtime.   Yes Historical Provider, MD  citalopram (CELEXA) 10 MG tablet Take 10 mg by mouth daily.   Yes Historical Provider, MD  diazepam (VALIUM) 10 MG tablet Take 10 mg by mouth 3 (three) times daily.   Yes Historical Provider, MD  fish oil-omega-3 fatty acids 1000 MG capsule Take 1 g by mouth 2 (two) times daily.   Yes Historical Provider, MD  furosemide (LASIX) 40 MG  tablet Take 40 mg by mouth daily.   Yes Historical Provider, MD  gabapentin (NEURONTIN) 800 MG tablet Take 800 mg by mouth 3 (three) times daily.   Yes Historical Provider, MD  glimepiride (AMARYL) 4 MG tablet Take 4 mg by mouth daily.    Yes Historical Provider, MD  HYDROcodone-acetaminophen (NORCO) 10-325 MG per tablet Take 1 tablet by mouth 3 (three) times daily.   Yes Historical Provider, MD   Physical Exam: Blood pressure 154/78, pulse 67, temperature 98.4 F (36.9 C), temperature source Oral, resp. rate 22, height 6' (1.829 m), weight 117.935 kg (260 lb), SpO2 94.00%. Filed Vitals:   02/05/13 1346  BP: 154/78  Pulse: 67  Temp:   Resp: 22     General:  He looks systemically well. He does not look toxic or septic.  Eyes: No pallor. No jaundice.  ENT: No major abnormalities.  Neck: No lymphadenopathy.  Cardiovascular: Heart sounds are present without murmurs or added sounds.  Respiratory: Lung fields are entirely clear at the present time without any crackles, wheezing or bronchial breathing.  Abdomen: Soft, obese, nontender. No hepatomegaly.  Skin: He has mild cellulitis affecting his left lower leg with areas where he has had ulcerations, these appear to be healed.  Musculoskeletal: Both his feet are pointing laterally, this is likely a reflection of his peripheral neuropathy  Psychiatric: Appropriate affect.  Neurologic: Alert and orientated without any focal neurological signs.  Labs on Admission:  Basic Metabolic Panel:  Recent Labs Lab 02/05/13 1252  NA 144  K 3.9  CL 103  CO2 33*  GLUCOSE 86  BUN 14  CREATININE 1.06  CALCIUM 9.7       CBC:  Recent Labs Lab 02/05/13 1252  WBC 11.3*  NEUTROABS 8.0*  HGB 15.4  HCT 45.1  MCV 97.2  PLT 210     Radiological Exams on Admission: Dg Chest 1 View  02/05/2013   *RADIOLOGY REPORT*  Clinical Data: Recent traumatic injury with chest pain  CHEST - 1 VIEW  Comparison: 11/17/2012  Findings: The  cardiac shadow is stable. No focal infiltrate or sizable effusion is noted.  No acute bony abnormality is seen.  IMPRESSION: No acute abnormality noted.   Original Report Authenticated By: Inez Catalina, M.D.   Dg Tibia/fibula Right  02/05/2013   *RADIOLOGY REPORT*  Clinical Data: Pain and swelling  RIGHT TIBIA AND FIBULA - 2 VIEW  Comparison: None.  Findings: No acute fracture is identified.  Significant degenerative changes are noted about the talocalcaneal joint.  No other bony abnormality is noted.  Generalized soft tissue swelling is seen.  IMPRESSION: Soft tissue swelling without acute bony abnormality.   Original Report Authenticated By: Inez Catalina, M.D.   Dg Foot Complete Right  02/05/2013   *RADIOLOGY REPORT*  Clinical Data: Medial right foot pain, fell two nights ago, history diabetes, diabetic peripheral neuropathy, hypertension  RIGHT FOOT COMPLETE - 3+ VIEW  Comparison: 11/05/2010  Findings: Osseous demineralization. Extensive destructive changes are identified at right first MTP joint with significant mature periosteal new bone, question sequela of prior infection and septic arthritis. No definite acute cortical bone loss or destruction identified. Additionally, post-traumatic or destructive changes are seen at the third metatarsal head. Significant soft tissue swelling greatest at the dorsum of the mid to distal foot. Large plantar calcaneal spur. No additional acute fracture, dislocation, or bone destruction.  IMPRESSION: Extensive destruction and deformity at the first MTP joint involving the head of the first metatarsal and the base of the proximal phalanx, question sequela of prior osteomyelitis/septic arthritis. Post-traumatic deformity versus bone destruction at third metatarsal head new since previous exam, cannot exclude acute fracture; recommend correlation for acute pain and tenderness at this site. No additional acute bony abnormalities identified.   Original Report Authenticated By:  Lavonia Dana, M.D.      Time spent: 30 minutes.  Doree Albee [facility] Pager [phone].  If 7PM-7AM, please contact night-coverage [email or web address] Password [email or web address] 02/05/2013, 4:09 PM

## 2013-02-05 NOTE — ED Provider Notes (Signed)
CSN: [id number]     Arrival date & time 02/05/13  1041 History   First MD Initiated Contact with Patient 02/05/13 1120     Chief Complaint  Patient presents with  . Foot Pain   (Consider location/radiation/quality/duration/timing/severity/associated sxs/prior Treatment) HPI Comments: And a patient is a 62 year old male resident of a local nursing facility who presents to the emergency department with a history of hypertension, type 2 diabetes mellitus, diabetic peripheral neuropathy, cerebrovascular accident, spinal stenosis, and depression.  The patient states that he does not do much walking but 2 days ago he was standing beside the base and into his bathroom, attempting to wash his hands, when his legs" gave away" and he sustained a fall. He began to notice increasing swelling of the right lower extremity, but it to the attention of the staff at the nursing facility and they arranged for him to be evaluated in the emergency department. It is of note that the patient has a history of peripheral edema. He is mostly bed or chair bound. Patient states that the pain seems to be getting progressively worse, and feels deep inside. It is also of note that the patient has been noticing some weeping from the left lower extremity. Patient states that this area is beginning to cause more pain as well.  Patient is a 62 y.o. male presenting with lower extremity pain. The history is provided by the patient and a relative.  Foot Pain This is a new problem. The current episode started in the past 7 days. The problem has been gradually worsening. Associated symptoms include arthralgias, fatigue, joint swelling and numbness. Pertinent negatives include no abdominal pain, chest pain, coughing, fever or neck pain. The symptoms are aggravated by standing. He has tried nothing for the symptoms. The treatment provided no relief.    Past Medical History  Diagnosis Date  . Hypertension   . Hypercholesteremia   . Type  II diabetes mellitus   . Diabetic peripheral neuropathy     "chronic" (05/27/2012)  . Stroke 05/2012    Subacute, lacunar infarcts within the left basal ganglia and posterior limp of the left internal capsule/thalamus; "RUE; both feet weak" (05/27/2012)  . Anxiety   . Spinal stenosis     mild lumbar (MRI 05/2012)-L2-L3 to L4-L5 , mild lumbar foraminal stenosis   . Chronic pain     legs, back; MRI 05/2012 with mild thoracic degenerative changes no spinal stenosis   . Depression    Past Surgical History  Procedure Laterality Date  . Hernia repair  01/05/2004    "belly button" (05/27/2012)  . Hernia repair  01/05/2004   History reviewed. No pertinent family history. History  Substance Use Topics  . Smoking status: Current Every Day Smoker -- 2.00 packs/day for 45 years    Types: Cigarettes  . Smokeless tobacco: Never Used  . Alcohol Use: No     Comment: 05/27/2012 "drank gallons and gallons 20 yr ago or so; last drink  at least 10 yr ago"    Review of Systems  Constitutional: Positive for fatigue. Negative for fever and activity change.       All ROS Neg except as noted in HPI  HENT: Negative for nosebleeds and neck pain.   Eyes: Negative for photophobia and discharge.  Respiratory: Negative for cough, shortness of breath and wheezing.   Cardiovascular: Negative for chest pain and palpitations.  Gastrointestinal: Negative for abdominal pain and blood in stool.  Genitourinary: Negative for dysuria, frequency and hematuria.  Musculoskeletal: Positive for joint swelling and arthralgias. Negative for back pain.  Skin: Positive for wound.  Neurological: Positive for tremors and numbness. Negative for dizziness, seizures and speech difficulty.  Psychiatric/Behavioral: Negative for hallucinations and confusion.    Allergies  Cucumber extract; Flexeril; and Kiwi extract  Home Medications   Current Outpatient Rx  Name  Route  Sig  Dispense  Refill  . aspirin EC 81 MG tablet    Oral   Take 81 mg by mouth daily.         Marland Kitchen atorvastatin (LIPITOR) 20 MG tablet   Oral   Take 20 mg by mouth at bedtime.         . citalopram (CELEXA) 10 MG tablet   Oral   Take 10 mg by mouth daily.         . diazepam (VALIUM) 10 MG tablet   Oral   Take 10 mg by mouth 3 (three) times daily.         . fish oil-omega-3 fatty acids 1000 MG capsule   Oral   Take 1 g by mouth 2 (two) times daily.         . furosemide (LASIX) 40 MG tablet   Oral   Take 40 mg by mouth daily.         Marland Kitchen gabapentin (NEURONTIN) 800 MG tablet   Oral   Take 800 mg by mouth 3 (three) times daily.         Marland Kitchen glimepiride (AMARYL) 4 MG tablet   Oral   Take 4 mg by mouth daily.          Marland Kitchen HYDROcodone-acetaminophen (NORCO) 10-325 MG per tablet   Oral   Take 1 tablet by mouth 3 (three) times daily.          BP 145/60  Pulse 81  Temp(Src) 98.4 F (36.9 C) (Oral)  Resp 22  Ht 6' (1.829 m)  Wt 260 lb (117.935 kg)  BMI 35.25 kg/m2  SpO2 97% Physical Exam  Nursing note and vitals reviewed. Constitutional: He is oriented to person, place, and time. He appears well-developed and well-nourished.  Non-toxic appearance.  HENT:  Head: Normocephalic.  Right Ear: Tympanic membrane and external ear normal.  Left Ear: Tympanic membrane and external ear normal.  Eyes: EOM and lids are normal. Pupils are equal, round, and reactive to light.  Neck: Normal range of motion. Neck supple. Carotid bruit is not present.  Cardiovascular: Normal rate, regular rhythm, normal heart sounds, intact distal pulses and normal pulses.   Pulmonary/Chest: No respiratory distress. He has wheezes. He has rhonchi.  Abdominal: Soft. Bowel sounds are normal. There is no tenderness. There is no guarding.  Musculoskeletal: Normal range of motion.  Pain to palpation in change of position in the lower lumbar region. No hot areas appreciated. The patient has pitting edema from the knees to the toes. There is deformity of  the right ankle and foot. Capillary refill is less than 3 seconds of both lower extremities. There is mild to moderate pain of the right ankle and lower leg.  There is pain from the mid calf to the ankle on the right. There is increased redness with 2 open ulcers of the right lower extremity, there are several areas that appear to have scabbed over on the right lower extremity. There is some increased redness of the dorsum of the right foot. There is some increased warmth of the right lower extremity.  Lymphadenopathy:  Head (right side): No submandibular adenopathy present.       Head (left side): No submandibular adenopathy present.    He has no cervical adenopathy.  Neurological: He is alert and oriented to person, place, and time. He has normal strength. No cranial nerve deficit or sensory deficit.  Skin: Skin is warm and dry.  Psychiatric: He has a normal mood and affect. His speech is normal.    ED Course  Procedures (including critical care time) Labs Review Labs Reviewed  BASIC METABOLIC PANEL  CBC WITH DIFFERENTIAL  PRO B NATRIURETIC PEPTIDE  URINALYSIS, ROUTINE W REFLEX MICROSCOPIC   Imaging Review Dg Foot Complete Right  02/05/2013   *RADIOLOGY REPORT*  Clinical Data: Medial right foot pain, fell two nights ago, history diabetes, diabetic peripheral neuropathy, hypertension  RIGHT FOOT COMPLETE - 3+ VIEW  Comparison: 11/05/2010  Findings: Osseous demineralization. Extensive destructive changes are identified at right first MTP joint with significant mature periosteal new bone, question sequela of prior infection and septic arthritis. No definite acute cortical bone loss or destruction identified. Additionally, post-traumatic or destructive changes are seen at the third metatarsal head. Significant soft tissue swelling greatest at the dorsum of the mid to distal foot. Large plantar calcaneal spur. No additional acute fracture, dislocation, or bone destruction.  IMPRESSION:  Extensive destruction and deformity at the first MTP joint involving the head of the first metatarsal and the base of the proximal phalanx, question sequela of prior osteomyelitis/septic arthritis. Post-traumatic deformity versus bone destruction at third metatarsal head new since previous exam, cannot exclude acute fracture; recommend correlation for acute pain and tenderness at this site. No additional acute bony abnormalities identified.   Original Report Authenticated By: Lavonia Dana, M.D.    MDM  No diagnosis found. **I have reviewed nursing notes, vital signs, and all appropriate lab and imaging results for this patient.*  Pt seen with me By  Dr. Towanda Malkin. Urinalysis is essentially within normal limits. Basic metabolic panel is within normal limits. Complete blood count shows an elevation in the white blood cells of 11,300. B. natruretic peptide is slightly elevated at 304.5. Chest x-ray is read as negative by the radiologist. X-ray of the right tibia and fibula show soft tissue swelling without any bony abnormality. X-ray of the right foot shows extensive destruction and deformity at the right metatarsophalangeal joint involving the head of the first metatarsal and the base of the proximal phalanx. This was questioned to be a sequela of the prior osteomyelitis and septic arthritis. There is also a posttraumatic deformity versus bone destruction at the third metatarsal head that is new since a previous exam. It could not be determined completely if this was acute fracture or an element of destructive deformity.  Concerned about the increased redness of the left lower extremity with some drainage present. Pt is diabetic.  This is accompanied by an elevated white blood cell count of 11,300. Patient has wheezes and coarse breath sounds consistent with a chronic obstructive pulmonary disease, he is a smoker. Albuterol neb treatment given.  Patient is in an assisted living facility. Feel that the patient  may need IV antibiotics at this time. IV vancomycin has been ordered. Will discuss the case with the hospitalist for admission. Case discussed with Dr Delane Ginger. Gasranoni, he will see the pt in the ED for possible admission. Pt evaluated by Dr Kellie Moor. He does not feel the patient need hospital admission. He suggest antibiotics at the nursing facility and close physician follow up.  I have discuss this with the patient. Rx for doxycycline and ceftin given to the patient. Rx for  Norco 7.5mg  given to the patient. He is to see his MD by Sept 8, or return to the Emergency Dept for recheck of the cellulitis. Pt breathing much easier after neb treatment.  Lenox Ahr, PA-C 02/05/13 1711

## 2013-02-05 NOTE — ED Notes (Signed)
Patient reports fell 2 nights ago. Complaining of pain, swelling, and deformity to right foot.

## 2013-02-05 NOTE — ED Notes (Signed)
After reviewing d/c instructions with pt. Pt sitting on side of bed. Pt informed that RN was going to call [employer] and arrange transport. Pt told that RN would be back to help him get his shoes on and remove IV. Shortly after RN left out of room. Pt rang the call bell. Pt sitting in floor by the bed. Pt reported, " I tried to get to my shoes."Pt denies any pain. EDP aware and assessed pt and reported no further testing indicated and can proceed with d/c.

## 2013-02-05 NOTE — ED Notes (Signed)
Neomycin applied to open sores on LLE and telfa applied to sores as well. LLE then wrapped with an ace bandage.Pt tolerated well.

## 2013-02-05 NOTE — ED Notes (Signed)
Contacted PA concerning plan of care. Pt is now being d/c. Hospitialist did not feel an admission was warrented. Pt informed and verbalized understanding. Pt aware awaiting for d/c papers.

## 2013-02-08 NOTE — ED Provider Notes (Signed)
Medical screening examination/treatment/procedure(s) were performed by non-physician practitioner and as supervising physician I was immediately available for consultation/collaboration.   Alfonzo Feller, DO 02/08/13 1151

## 2013-02-09 LAB — WOUND CULTURE

## 2013-02-13 NOTE — ED Notes (Signed)
+   wound culture Patient treated with Cefuroxime -sensitive to same-chart appended per protocol MD

## 2013-02-17 ENCOUNTER — Emergency Department (HOSPITAL_COMMUNITY)
Admission: EM | Admit: 2013-02-17 | Discharge: 2013-02-17 | Disposition: A | Discharge status: Home / Self Care | Payer: Medicare Other | Attending: Emergency Medicine | Admitting: Emergency Medicine

## 2013-02-17 ENCOUNTER — Emergency Department (HOSPITAL_COMMUNITY): Payer: Medicare Other

## 2013-02-17 ENCOUNTER — Encounter (HOSPITAL_COMMUNITY): Payer: Self-pay

## 2013-02-17 DIAGNOSIS — Z79899 Other long term (current) drug therapy: Secondary | ICD-10-CM | POA: Insufficient documentation

## 2013-02-17 DIAGNOSIS — Z9889 Other specified postprocedural states: Secondary | ICD-10-CM | POA: Insufficient documentation

## 2013-02-17 DIAGNOSIS — L03116 Cellulitis of left lower limb: Secondary | ICD-10-CM

## 2013-02-17 DIAGNOSIS — Z91199 Patient's noncompliance with other medical treatment and regimen due to unspecified reason: Secondary | ICD-10-CM | POA: Insufficient documentation

## 2013-02-17 DIAGNOSIS — E1149 Type 2 diabetes mellitus with other diabetic neurological complication: Secondary | ICD-10-CM | POA: Insufficient documentation

## 2013-02-17 DIAGNOSIS — F17200 Nicotine dependence, unspecified, uncomplicated: Secondary | ICD-10-CM | POA: Insufficient documentation

## 2013-02-17 DIAGNOSIS — Z792 Long term (current) use of antibiotics: Secondary | ICD-10-CM | POA: Insufficient documentation

## 2013-02-17 DIAGNOSIS — G909 Disorder of the autonomic nervous system, unspecified: Secondary | ICD-10-CM | POA: Insufficient documentation

## 2013-02-17 DIAGNOSIS — Z8739 Personal history of other diseases of the musculoskeletal system and connective tissue: Secondary | ICD-10-CM | POA: Insufficient documentation

## 2013-02-17 DIAGNOSIS — G8929 Other chronic pain: Secondary | ICD-10-CM | POA: Insufficient documentation

## 2013-02-17 DIAGNOSIS — R609 Edema, unspecified: Secondary | ICD-10-CM | POA: Insufficient documentation

## 2013-02-17 DIAGNOSIS — Z8673 Personal history of transient ischemic attack (TIA), and cerebral infarction without residual deficits: Secondary | ICD-10-CM | POA: Insufficient documentation

## 2013-02-17 DIAGNOSIS — Z9119 Patient's noncompliance with other medical treatment and regimen: Secondary | ICD-10-CM | POA: Insufficient documentation

## 2013-02-17 DIAGNOSIS — L02419 Cutaneous abscess of limb, unspecified: Secondary | ICD-10-CM | POA: Insufficient documentation

## 2013-02-17 DIAGNOSIS — F411 Generalized anxiety disorder: Secondary | ICD-10-CM | POA: Insufficient documentation

## 2013-02-17 DIAGNOSIS — Z7982 Long term (current) use of aspirin: Secondary | ICD-10-CM | POA: Insufficient documentation

## 2013-02-17 DIAGNOSIS — E7800 Pure hypercholesterolemia, unspecified: Secondary | ICD-10-CM | POA: Insufficient documentation

## 2013-02-17 DIAGNOSIS — F329 Major depressive disorder, single episode, unspecified: Secondary | ICD-10-CM | POA: Insufficient documentation

## 2013-02-17 DIAGNOSIS — I10 Essential (primary) hypertension: Secondary | ICD-10-CM | POA: Insufficient documentation

## 2013-02-17 DIAGNOSIS — F3289 Other specified depressive episodes: Secondary | ICD-10-CM | POA: Insufficient documentation

## 2013-02-17 HISTORY — DX: Edema, unspecified: R60.9

## 2013-02-17 HISTORY — DX: Peripheral vascular disease, unspecified: I73.9

## 2013-02-17 HISTORY — DX: Localized edema: R60.0

## 2013-02-17 LAB — CBC WITH DIFFERENTIAL/PLATELET
Basophils Relative: 0 % (ref 0–1)
Eosinophils Absolute: 0.4 10*3/uL (ref 0.0–0.7)
Eosinophils Relative: 5 % (ref 0–5)
HCT: 42.1 % (ref 39.0–52.0)
Hemoglobin: 14 g/dL (ref 13.0–17.0)
MCH: 32 pg (ref 26.0–34.0)
MCHC: 33.3 g/dL (ref 30.0–36.0)
MCV: 96.3 fL (ref 78.0–100.0)
Monocytes Absolute: 0.6 10*3/uL (ref 0.1–1.0)
Monocytes Relative: 7 % (ref 3–12)
Neutro Abs: 5.4 10*3/uL (ref 1.7–7.7)
RDW: 13.8 % (ref 11.5–15.5)

## 2013-02-17 LAB — BASIC METABOLIC PANEL
BUN: 13 mg/dL (ref 6–23)
Calcium: 9.2 mg/dL (ref 8.4–10.5)
Chloride: 106 mEq/L (ref 96–112)
Creatinine, Ser: 0.98 mg/dL (ref 0.50–1.35)
GFR calc Af Amer: >90 mL/min (ref >90)

## 2013-02-17 MED ORDER — CLINDAMYCIN HCL 150 MG PO CAPS: ORAL_CAPSULE | ORAL | Status: DC

## 2013-02-17 MED ORDER — HYDROCODONE-ACETAMINOPHEN 5-325 MG PO TABS
1.0000 | ORAL_TABLET | Freq: Once | ORAL | Status: AC
  Administered 2013-02-17: 1 via ORAL
  Filled 2013-02-17: qty 1

## 2013-02-17 MED ORDER — CLINDAMYCIN PHOSPHATE 900 MG/50ML IV SOLN
900.0000 mg | Freq: Once | INTRAVENOUS | Status: AC
  Administered 2013-02-17: 900 mg via INTRAVENOUS
  Filled 2013-02-17: qty 50

## 2013-02-17 NOTE — ED Notes (Signed)
[facility] called to bring pt home. Discharge inst to given to [facility]

## 2013-02-17 NOTE — ED Provider Notes (Signed)
CSN: [id number]     Arrival date & time 02/17/13  0759 History   First MD Initiated Contact with Patient 02/17/13 [phone]     Chief Complaint  Patient presents with  . Recurrent Skin Infections    HPI Pt was seen at 0905. Per pt, c/o gradual onset and persistence of constant "red legs" for the past several weeks. Pt states he took a course of "2 different antibiotics" and he "isn't sure if it's getting better." NH states pt has chronic bilat LE's swelling "because he is non-compliant and won't keep his legs elevated." Denies fevers, no open wounds, no injury, no focal motor weakness, no tingling/numbness in extremities.    Past Medical History  Diagnosis Date  . Hypertension   . Hypercholesteremia   . Type II diabetes mellitus   . Diabetic peripheral neuropathy     "chronic" (05/27/2012)  . Stroke 05/2012    Subacute, lacunar infarcts within the left basal ganglia and posterior limp of the left internal capsule/thalamus; "RUE; both feet weak" (05/27/2012)  . Anxiety   . Spinal stenosis     mild lumbar (MRI 05/2012)-L2-L3 to L4-L5 , mild lumbar foraminal stenosis   . Chronic pain     legs, back; MRI 05/2012 with mild thoracic degenerative changes no spinal stenosis   . Depression   . PVD (peripheral vascular disease)    Past Surgical History  Procedure Laterality Date  . Hernia repair  01/05/2004    "belly button" (05/27/2012)  . Hernia repair  01/05/2004  . Ankle surgery      History  Substance Use Topics  . Smoking status: Current Every Day Smoker -- 2.00 packs/day for 45 years    Types: Cigarettes  . Smokeless tobacco: Never Used  . Alcohol Use: No     Comment: 05/27/2012 "drank gallons and gallons 20 yr ago or so; last drink  at least 10 yr ago"    Review of Systems ROS: Statement: All systems negative except as marked or noted in the HPI; Constitutional: Negative for fever and chills. ; ; Eyes: Negative for eye pain, redness and discharge. ; ; ENMT: Negative for ear pain,  hoarseness, nasal congestion, sinus pressure and sore throat. ; ; Cardiovascular: Negative for chest pain, palpitations, diaphoresis, dyspnea and +chronic peripheral edema. ; ; Respiratory: Negative for cough, wheezing and stridor. ; ; Gastrointestinal: Negative for nausea, vomiting, diarrhea, abdominal pain, blood in stool, hematemesis, jaundice and rectal bleeding. . ; ; Genitourinary: Negative for dysuria, flank pain and hematuria. ; ; Musculoskeletal: Negative for back pain and neck pain. Negative for swelling and trauma.; ; Skin: +rash. Negative for pruritus, abrasions, blisters, bruising and skin lesion.; ; Neuro: Negative for headache, lightheadedness and neck stiffness. Negative for weakness, altered level of consciousness , altered mental status, extremity weakness, paresthesias, involuntary movement, seizure and syncope.       Allergies  Cucumber extract; Flexeril; and Kiwi extract  Home Medications   Current Outpatient Rx  Name  Route  Sig  Dispense  Refill  . amLODipine (NORVASC) 5 MG tablet   Oral   Take 5 mg by mouth daily.         Marland Kitchen aspirin EC 81 MG tablet   Oral   Take 81 mg by mouth daily.         Marland Kitchen atorvastatin (LIPITOR) 20 MG tablet   Oral   Take 20 mg by mouth at bedtime.         . citalopram (CELEXA) 10 MG  tablet   Oral   Take 10 mg by mouth daily.         . diazepam (VALIUM) 10 MG tablet   Oral   Take 10 mg by mouth 3 (three) times daily.         . fish oil-omega-3 fatty acids 1000 MG capsule   Oral   Take 1 g by mouth 2 (two) times daily.         . furosemide (LASIX) 40 MG tablet   Oral   Take 40 mg by mouth daily.         Marland Kitchen gabapentin (NEURONTIN) 800 MG tablet   Oral   Take 800 mg by mouth 3 (three) times daily.         Marland Kitchen glimepiride (AMARYL) 4 MG tablet   Oral   Take 4 mg by mouth daily.          Marland Kitchen HYDROcodone-acetaminophen (NORCO) 10-325 MG per tablet   Oral   Take 1 tablet by mouth 3 (three) times daily.         .  cefUROXime (CEFTIN) 500 MG tablet   Oral   Take 1 tablet (500 mg total) by mouth 2 (two) times daily.   14 tablet   0   . doxycycline (VIBRAMYCIN) 100 MG capsule   Oral   Take 100 mg by mouth 2 (two) times daily.          BP 126/68  Pulse 65  Temp(Src) 98.3 F (36.8 C) (Oral)  Resp 16  Ht 6' (1.829 m)  Wt 250 lb (113.399 kg)  BMI 33.9 kg/m2  SpO2 98% Physical Exam 0910: Physical examination:  Nursing notes reviewed; Vital signs and O2 SAT reviewed;  Constitutional: Well developed, Well nourished, Well hydrated, In no acute distress; Head:  Normocephalic, atraumatic; Eyes: EOMI, PERRL, No scleral icterus; ENMT: Mouth and pharynx normal, Mucous membranes moist; Neck: Supple, Full range of motion, No lymphadenopathy; Cardiovascular: Regular rate and rhythm, No murmur, rub, or gallop; Respiratory: Breath sounds clear & equal bilaterally, No rales, rhonchi, wheezes.  Speaking full sentences with ease, Normal respiratory effort/excursion; Chest: Nontender, Movement normal; Abdomen: Soft, Nontender, Nondistended, Normal bowel sounds; Genitourinary: No CVA tenderness; Extremities: Pulses normal, No tenderness, +2 bilat pedal edema from feet to knees without calf edema asymmetry. Pedal pulses are palpable. No erythema or wounds RLE. Left lower leg with 2 small areas of superificial healing ulcerations with mild surrounding erythema, no streaking up leg, no fluctuance, no drainage, no open wounds, no ecchymosis.; Neuro: AA&Ox3, Major CN grossly intact.  Speech clear. No gross focal motor or sensory deficits in extremities.; Skin: Color normal, Warm, Dry.   ED Course  Procedures     MDM  MDM Reviewed: previous chart, nursing note and vitals Reviewed previous: labs Interpretation: labs and x-ray   Results for orders placed during the hospital encounter of 02/17/13  CBC WITH DIFFERENTIAL      Result Value Range   WBC 7.9  4.0 - 10.5 K/uL   RBC 4.37  4.22 - 5.81 MIL/uL   Hemoglobin 14.0   13.0 - 17.0 g/dL   HCT 42.1  39.0 - 52.0 %   MCV 96.3  78.0 - 100.0 fL   MCH 32.0  26.0 - 34.0 pg   MCHC 33.3  30.0 - 36.0 g/dL   RDW 13.8  11.5 - 15.5 %   Platelets 224  150 - 400 K/uL   Neutrophils Relative % 69  43 - 77 %  Neutro Abs 5.4  1.7 - 7.7 K/uL   Lymphocytes Relative 19  12 - 46 %   Lymphs Abs 1.5  0.7 - 4.0 K/uL   Monocytes Relative 7  3 - 12 %   Monocytes Absolute 0.6  0.1 - 1.0 K/uL   Eosinophils Relative 5  0 - 5 %   Eosinophils Absolute 0.4  0.0 - 0.7 K/uL   Basophils Relative 0  0 - 1 %   Basophils Absolute 0.0  0.0 - 0.1 K/uL  BASIC METABOLIC PANEL      Result Value Range   Sodium 142  135 - 145 mEq/L   Potassium 4.3  3.5 - 5.1 mEq/L   Chloride 106  96 - 112 mEq/L   CO2 33 (*) 19 - 32 mEq/L   Glucose, Bld 148 (*) 70 - 99 mg/dL   BUN 13  6 - 23 mg/dL   Creatinine, Ser 0.98  0.50 - 1.35 mg/dL   Calcium 9.2  8.4 - 10.5 mg/dL   GFR calc non Af Amer 86 (*) >90 mL/min   GFR calc Af Amer >90  >90 mL/min  LACTIC ACID, PLASMA      Result Value Range   Lactic Acid, Venous 0.8  0.5 - 2.2 mmol/L    Dg Tibia/fibula Left 02/17/2013   CLINICAL DATA:  62 year old male with soft tissue wounds. Diabetes.  EXAM: LEFT TIBIA AND FIBULA - 2 VIEW  COMPARISON:  Right knee series 8 11/2005.  FINDINGS: Sequelae of left ankle ORIF. Cortical screws, fibular plate, and distal tibia K-wire appear intact. Residual spiral fracture plane at the distal left fibula identified. No acute fracture. No osteolysis in the left tibia or fibula. No subcutaneous gas identified. No radiopaque foreign body identified.  IMPRESSION: No acute osseous abnormality identified at the left tib-fib ankle ORIF.   Electronically Signed   By: Lars Pinks M.D.   On: 02/17/2013 10:18     1300:  EPIC chart reviewed. Pt's bilat lower legs with significantly improved erythema compared to previous chart description (LLE redness from foot to knee with open, draining wounds). LLE with 2 small areas of healing superificial  ulcerations with mild surrounding erythema, no spontaneous drainage and no drainage able to be manually expressed, no fluctuance, no streaking up leg, no open wounds, no ecchymosis. Will cover with DSD now and dose clindamycin. Pt remains afebrile, normal WBC count and lactic acid. No criteria to admit at this time. Dx and testing d/w pt.  Questions answered.  Verb understanding, agreeable to d/c home with outpt f/u.      Alfonzo Feller, DO 02/20/13 1225

## 2013-02-17 NOTE — ED Notes (Signed)
[facility] facility called for update. Facility asking for pt to be admitted at this time as RN states pt is noncompliant, will not keep legs elevated and " sits outside in wheelchair smoking ".  EDP to be notified of facilities concerns.

## 2013-02-17 NOTE — ED Notes (Signed)
Dr. Thurnell Garbe at bedside,

## 2013-02-17 NOTE — ED Notes (Signed)
Pt reports has sores and swelling to bilateral lower  legs for past couple of weeks.  Pt was put on doxycycline and cefuroxime on sept 4 and stopped on sept 10.

## 2013-02-17 NOTE — ED Notes (Signed)
Pt requesting pain medication, Dr. Thurnell Garbe notified, no additional orders given

## 2013-07-16 ENCOUNTER — Encounter (HOSPITAL_COMMUNITY): Payer: Self-pay | Admitting: Emergency Medicine

## 2013-07-16 ENCOUNTER — Emergency Department (HOSPITAL_COMMUNITY): Payer: Medicare Other

## 2013-07-16 ENCOUNTER — Emergency Department (HOSPITAL_COMMUNITY)
Admission: EM | Admit: 2013-07-16 | Discharge: 2013-07-16 | Disposition: A | Discharge status: Home / Self Care | Payer: Medicare Other | Attending: Emergency Medicine | Admitting: Emergency Medicine

## 2013-07-16 DIAGNOSIS — Z7982 Long term (current) use of aspirin: Secondary | ICD-10-CM | POA: Insufficient documentation

## 2013-07-16 DIAGNOSIS — S8011XA Contusion of right lower leg, initial encounter: Secondary | ICD-10-CM

## 2013-07-16 DIAGNOSIS — F17200 Nicotine dependence, unspecified, uncomplicated: Secondary | ICD-10-CM | POA: Insufficient documentation

## 2013-07-16 DIAGNOSIS — Z8739 Personal history of other diseases of the musculoskeletal system and connective tissue: Secondary | ICD-10-CM | POA: Insufficient documentation

## 2013-07-16 DIAGNOSIS — F3289 Other specified depressive episodes: Secondary | ICD-10-CM | POA: Insufficient documentation

## 2013-07-16 DIAGNOSIS — F411 Generalized anxiety disorder: Secondary | ICD-10-CM | POA: Insufficient documentation

## 2013-07-16 DIAGNOSIS — F329 Major depressive disorder, single episode, unspecified: Secondary | ICD-10-CM | POA: Insufficient documentation

## 2013-07-16 DIAGNOSIS — Z792 Long term (current) use of antibiotics: Secondary | ICD-10-CM | POA: Insufficient documentation

## 2013-07-16 DIAGNOSIS — Z79899 Other long term (current) drug therapy: Secondary | ICD-10-CM | POA: Insufficient documentation

## 2013-07-16 DIAGNOSIS — Y929 Unspecified place or not applicable: Secondary | ICD-10-CM | POA: Insufficient documentation

## 2013-07-16 DIAGNOSIS — Z9889 Other specified postprocedural states: Secondary | ICD-10-CM | POA: Insufficient documentation

## 2013-07-16 DIAGNOSIS — S8010XA Contusion of unspecified lower leg, initial encounter: Secondary | ICD-10-CM | POA: Insufficient documentation

## 2013-07-16 DIAGNOSIS — Y9389 Activity, other specified: Secondary | ICD-10-CM | POA: Insufficient documentation

## 2013-07-16 DIAGNOSIS — W19XXXA Unspecified fall, initial encounter: Secondary | ICD-10-CM

## 2013-07-16 DIAGNOSIS — I10 Essential (primary) hypertension: Secondary | ICD-10-CM | POA: Insufficient documentation

## 2013-07-16 DIAGNOSIS — W050XXA Fall from non-moving wheelchair, initial encounter: Secondary | ICD-10-CM | POA: Insufficient documentation

## 2013-07-16 DIAGNOSIS — E1149 Type 2 diabetes mellitus with other diabetic neurological complication: Secondary | ICD-10-CM | POA: Insufficient documentation

## 2013-07-16 DIAGNOSIS — Z8673 Personal history of transient ischemic attack (TIA), and cerebral infarction without residual deficits: Secondary | ICD-10-CM | POA: Insufficient documentation

## 2013-07-16 DIAGNOSIS — G8929 Other chronic pain: Secondary | ICD-10-CM | POA: Insufficient documentation

## 2013-07-16 DIAGNOSIS — E7800 Pure hypercholesterolemia, unspecified: Secondary | ICD-10-CM | POA: Insufficient documentation

## 2013-07-16 DIAGNOSIS — E1142 Type 2 diabetes mellitus with diabetic polyneuropathy: Secondary | ICD-10-CM | POA: Insufficient documentation

## 2013-07-16 NOTE — ED Notes (Signed)
[facility] called to bring pt home

## 2013-07-16 NOTE — Discharge Instructions (Signed)
X-ray shows no fracture. Ice. Tylenol or ibuprofen for pain

## 2013-07-16 NOTE — ED Provider Notes (Signed)
CSN: [id number]     Arrival date & time 07/16/13  1439 History   First MD Initiated Contact with Patient 07/16/13 1448     Chief Complaint  Patient presents with  . Fall     (Consider location/radiation/quality/duration/timing/severity/associated sxs/prior Treatment) HPI... accidental fall out of her wheelchair this afternoon. Patient fell asleep and accidentally slid out.  Complains of pain in his right tibia and fibula area.  No head or neck trauma. Patient alert and oriented x3. Severity is mild.  Past Medical History  Diagnosis Date  . Hypertension   . Hypercholesteremia   . Type II diabetes mellitus   . Diabetic peripheral neuropathy     "chronic" (05/27/2012)  . Stroke 05/2012    Subacute, lacunar infarcts within the left basal ganglia and posterior limp of the left internal capsule/thalamus; "RUE; both feet weak" (05/27/2012)  . Anxiety   . Spinal stenosis     mild lumbar (MRI 05/2012)-L2-L3 to L4-L5 , mild lumbar foraminal stenosis   . Chronic pain     legs, back; MRI 05/2012 with mild thoracic degenerative changes no spinal stenosis   . Depression   . PVD (peripheral vascular disease)   . Peripheral edema    Past Surgical History  Procedure Laterality Date  . Hernia repair  01/05/2004    "belly button" (05/27/2012)  . Hernia repair  01/05/2004  . Ankle surgery     History reviewed. No pertinent family history. History  Substance Use Topics  . Smoking status: Current Every Day Smoker -- 2.00 packs/day for 45 years    Types: Cigarettes  . Smokeless tobacco: Never Used  . Alcohol Use: No     Comment: 05/27/2012 "drank gallons and gallons 20 yr ago or so; last drink  at least 10 yr ago"    Review of Systems  All other systems reviewed and are negative.      Allergies  Cucumber extract; Flexeril; and Kiwi extract  Home Medications   Current Outpatient Rx  Name  Route  Sig  Dispense  Refill  . amLODipine (NORVASC) 5 MG tablet   Oral   Take 5 mg by mouth  daily.         Marland Kitchen aspirin EC 81 MG tablet   Oral   Take 81 mg by mouth daily.         Marland Kitchen atorvastatin (LIPITOR) 20 MG tablet   Oral   Take 20 mg by mouth at bedtime.         . cefUROXime (CEFTIN) 500 MG tablet   Oral   Take 1 tablet (500 mg total) by mouth 2 (two) times daily.   14 tablet   0   . citalopram (CELEXA) 10 MG tablet   Oral   Take 10 mg by mouth daily.         . clindamycin (CLEOCIN) 150 MG capsule      3 tabs PO TID x 10 days   90 capsule   0   . diazepam (VALIUM) 10 MG tablet   Oral   Take 10 mg by mouth 3 (three) times daily.         Marland Kitchen doxycycline (VIBRAMYCIN) 100 MG capsule   Oral   Take 100 mg by mouth 2 (two) times daily.         . fish oil-omega-3 fatty acids 1000 MG capsule   Oral   Take 1 g by mouth 2 (two) times daily.         Marland Kitchen  furosemide (LASIX) 40 MG tablet   Oral   Take 40 mg by mouth daily.         Marland Kitchen gabapentin (NEURONTIN) 800 MG tablet   Oral   Take 800 mg by mouth 3 (three) times daily.         Marland Kitchen glimepiride (AMARYL) 4 MG tablet   Oral   Take 4 mg by mouth daily.          Marland Kitchen HYDROcodone-acetaminophen (NORCO) 10-325 MG per tablet   Oral   Take 1 tablet by mouth 3 (three) times daily.          BP 149/68  Pulse 72  Temp(Src) 97.6 F (36.4 C) (Oral)  Resp 18  Ht 6' (1.829 m)  Wt 270 lb (122.471 kg)  BMI 36.61 kg/m2  SpO2 97% Physical Exam  Nursing note and vitals reviewed. Constitutional: He is oriented to person, place, and time. He appears well-developed and well-nourished.  HENT:  Head: Normocephalic and atraumatic.  Eyes: Conjunctivae and EOM are normal. Pupils are equal, round, and reactive to light.  Neck: Normal range of motion. Neck supple.  Cardiovascular: Normal rate, regular rhythm and normal heart sounds.   Pulmonary/Chest: Effort normal and breath sounds normal.  Abdominal: Soft. Bowel sounds are normal.  Musculoskeletal:  Right lower extremity: Tender in the proximal lateral fibula  area.  Neurological: He is alert and oriented to person, place, and time.  Skin: Skin is warm and dry.  Psychiatric: He has a normal mood and affect. His behavior is normal.    ED Course  Procedures (including critical care time) Labs Review Labs Reviewed - No data to display Imaging Review Dg Tibia/fibula Right  07/16/2013   CLINICAL DATA:  [place] out of wheelchair, pain at upper tibia  EXAM: RIGHT TIBIA AND FIBULA - 2 VIEW  COMPARISON:  02/05/2013  FINDINGS: Osseous mineralization grossly normal.  Knee and ankle joint alignments normal.  No definite fracture, dislocation or bone destruction.  Scattered soft tissue swelling.  IMPRESSION: No acute osseous abnormalities.   Electronically Signed   By: Lavonia Dana M.D.   On: 07/16/2013 15:36    EKG Interpretation   None       MDM   Final diagnoses:  Fall  Contusion of right lower extremity    Plain films of right tib-fib were negative for fracture. No head or neck imaging necessary.    Nat Christen, MD 07/16/13 [phone]

## 2013-07-16 NOTE — ED Notes (Signed)
Pt comes from [facility] via EMS after a reported fall that occurred this afternoon. Pt states he was sleeping in his wheelchair and he "slid out the end". Pt reports mild pain in right calf. Pt denies hitting head, denies LOC. Pt is A&Ox4. Fall was not witnessed but pt is good historian of event.

## 2014-04-02 ENCOUNTER — Emergency Department (HOSPITAL_COMMUNITY): Payer: Medicare Other

## 2014-04-02 ENCOUNTER — Encounter (HOSPITAL_COMMUNITY): Payer: Self-pay | Admitting: Emergency Medicine

## 2014-04-02 ENCOUNTER — Inpatient Hospital Stay (HOSPITAL_COMMUNITY)
Admission: EM | Admit: 2014-04-02 | Discharge: 2014-04-09 | DRG: 190 | Disposition: A | Discharge status: Skilled Nursing Facility | Payer: Medicare Other | Attending: Internal Medicine | Admitting: Internal Medicine

## 2014-04-02 DIAGNOSIS — R059 Cough, unspecified: Secondary | ICD-10-CM

## 2014-04-02 DIAGNOSIS — N179 Acute kidney failure, unspecified: Secondary | ICD-10-CM | POA: Diagnosis present

## 2014-04-02 DIAGNOSIS — J9601 Acute respiratory failure with hypoxia: Secondary | ICD-10-CM | POA: Diagnosis present

## 2014-04-02 DIAGNOSIS — R109 Unspecified abdominal pain: Secondary | ICD-10-CM

## 2014-04-02 DIAGNOSIS — F419 Anxiety disorder, unspecified: Secondary | ICD-10-CM | POA: Diagnosis present

## 2014-04-02 DIAGNOSIS — I5033 Acute on chronic diastolic (congestive) heart failure: Secondary | ICD-10-CM | POA: Diagnosis present

## 2014-04-02 DIAGNOSIS — J441 Chronic obstructive pulmonary disease with (acute) exacerbation: Secondary | ICD-10-CM | POA: Diagnosis present

## 2014-04-02 DIAGNOSIS — J40 Bronchitis, not specified as acute or chronic: Secondary | ICD-10-CM

## 2014-04-02 DIAGNOSIS — R0602 Shortness of breath: Secondary | ICD-10-CM | POA: Diagnosis present

## 2014-04-02 DIAGNOSIS — E1142 Type 2 diabetes mellitus with diabetic polyneuropathy: Secondary | ICD-10-CM | POA: Diagnosis present

## 2014-04-02 DIAGNOSIS — E1122 Type 2 diabetes mellitus with diabetic chronic kidney disease: Secondary | ICD-10-CM

## 2014-04-02 DIAGNOSIS — G8929 Other chronic pain: Secondary | ICD-10-CM | POA: Diagnosis present

## 2014-04-02 DIAGNOSIS — Z79891 Long term (current) use of opiate analgesic: Secondary | ICD-10-CM | POA: Diagnosis not present

## 2014-04-02 DIAGNOSIS — Z7982 Long term (current) use of aspirin: Secondary | ICD-10-CM | POA: Diagnosis not present

## 2014-04-02 DIAGNOSIS — R911 Solitary pulmonary nodule: Secondary | ICD-10-CM | POA: Diagnosis present

## 2014-04-02 DIAGNOSIS — J45901 Unspecified asthma with (acute) exacerbation: Secondary | ICD-10-CM

## 2014-04-02 DIAGNOSIS — N184 Chronic kidney disease, stage 4 (severe): Secondary | ICD-10-CM

## 2014-04-02 DIAGNOSIS — K5900 Constipation, unspecified: Secondary | ICD-10-CM | POA: Diagnosis present

## 2014-04-02 DIAGNOSIS — E780 Pure hypercholesterolemia: Secondary | ICD-10-CM | POA: Diagnosis present

## 2014-04-02 DIAGNOSIS — I10 Essential (primary) hypertension: Secondary | ICD-10-CM | POA: Diagnosis present

## 2014-04-02 DIAGNOSIS — Z7952 Long term (current) use of systemic steroids: Secondary | ICD-10-CM

## 2014-04-02 DIAGNOSIS — Z8673 Personal history of transient ischemic attack (TIA), and cerebral infarction without residual deficits: Secondary | ICD-10-CM

## 2014-04-02 DIAGNOSIS — R0902 Hypoxemia: Secondary | ICD-10-CM | POA: Diagnosis present

## 2014-04-02 DIAGNOSIS — E785 Hyperlipidemia, unspecified: Secondary | ICD-10-CM | POA: Diagnosis present

## 2014-04-02 DIAGNOSIS — K56609 Unspecified intestinal obstruction, unspecified as to partial versus complete obstruction: Secondary | ICD-10-CM

## 2014-04-02 DIAGNOSIS — F17210 Nicotine dependence, cigarettes, uncomplicated: Secondary | ICD-10-CM | POA: Diagnosis present

## 2014-04-02 DIAGNOSIS — I5032 Chronic diastolic (congestive) heart failure: Secondary | ICD-10-CM | POA: Diagnosis present

## 2014-04-02 DIAGNOSIS — I739 Peripheral vascular disease, unspecified: Secondary | ICD-10-CM | POA: Diagnosis present

## 2014-04-02 DIAGNOSIS — Z79899 Other long term (current) drug therapy: Secondary | ICD-10-CM

## 2014-04-02 DIAGNOSIS — E119 Type 2 diabetes mellitus without complications: Secondary | ICD-10-CM

## 2014-04-02 DIAGNOSIS — R05 Cough: Secondary | ICD-10-CM

## 2014-04-02 LAB — COMPREHENSIVE METABOLIC PANEL
ALK PHOS: 71 U/L (ref 39–117)
ALT: 9 U/L (ref 0–53)
AST: 17 U/L (ref 0–37)
Albumin: 3.5 g/dL (ref 3.5–5.2)
Anion gap: 11 (ref 5–15)
BUN: 20 mg/dL (ref 6–23)
CO2: 30 meq/L (ref 19–32)
Calcium: 9.1 mg/dL (ref 8.4–10.5)
Chloride: 105 mEq/L (ref 96–112)
Creatinine, Ser: 1.16 mg/dL (ref 0.50–1.35)
GFR calc non Af Amer: 65 mL/min — ABNORMAL LOW (ref >90)
GFR, EST AFRICAN AMERICAN: 76 mL/min — AB (ref >90)
GLUCOSE: 94 mg/dL (ref 70–99)
POTASSIUM: 4.1 meq/L (ref 3.7–5.3)
SODIUM: 146 meq/L (ref 137–147)
TOTAL PROTEIN: 7.9 g/dL (ref 6.0–8.3)
Total Bilirubin: 0.5 mg/dL (ref 0.3–1.2)

## 2014-04-02 LAB — CBC WITH DIFFERENTIAL/PLATELET
Basophils Absolute: 0 10*3/uL (ref 0.0–0.1)
Basophils Relative: 0 % (ref 0–1)
EOS ABS: 0.3 10*3/uL (ref 0.0–0.7)
EOS PCT: 3 % (ref 0–5)
HEMATOCRIT: 39.6 % (ref 39.0–52.0)
Hemoglobin: 12.8 g/dL — ABNORMAL LOW (ref 13.0–17.0)
LYMPHS ABS: 2.1 10*3/uL (ref 0.7–4.0)
LYMPHS PCT: 22 % (ref 12–46)
MCH: 32.5 pg (ref 26.0–34.0)
MCHC: 32.3 g/dL (ref 30.0–36.0)
MCV: 100.5 fL — AB (ref 78.0–100.0)
MONO ABS: 0.7 10*3/uL (ref 0.1–1.0)
MONOS PCT: 7 % (ref 3–12)
Neutro Abs: 6.6 10*3/uL (ref 1.7–7.7)
Neutrophils Relative %: 68 % (ref 43–77)
PLATELETS: 280 10*3/uL (ref 150–400)
RBC: 3.94 MIL/uL — AB (ref 4.22–5.81)
RDW: 14.5 % (ref 11.5–15.5)
WBC: 9.8 10*3/uL (ref 4.0–10.5)

## 2014-04-02 LAB — TROPONIN I: Troponin I: <0.3 ng/mL (ref <0.30)

## 2014-04-02 MED ORDER — IPRATROPIUM-ALBUTEROL 0.5-2.5 (3) MG/3ML IN SOLN
3.0000 mL | Freq: Once | RESPIRATORY_TRACT | Status: AC
  Administered 2014-04-02: 3 mL via RESPIRATORY_TRACT
  Filled 2014-04-02: qty 3

## 2014-04-02 MED ORDER — IPRATROPIUM-ALBUTEROL 0.5-2.5 (3) MG/3ML IN SOLN
3.0000 mL | Freq: Four times a day (QID) | RESPIRATORY_TRACT | Status: DC
  Administered 2014-04-02 – 2014-04-05 (×13): 3 mL via RESPIRATORY_TRACT
  Filled 2014-04-02 (×13): qty 3

## 2014-04-02 MED ORDER — DOCUSATE SODIUM 100 MG PO CAPS
100.0000 mg | ORAL_CAPSULE | Freq: Every day | ORAL | Status: DC | PRN
  Administered 2014-04-06: 100 mg via ORAL
  Filled 2014-04-02: qty 1

## 2014-04-02 MED ORDER — ALBUTEROL SULFATE HFA 108 (90 BASE) MCG/ACT IN AERS
2.0000 | INHALATION_SPRAY | Freq: Once | RESPIRATORY_TRACT | Status: AC
  Administered 2014-04-02: 2 via RESPIRATORY_TRACT
  Filled 2014-04-02: qty 6.7

## 2014-04-02 MED ORDER — ACETAMINOPHEN 650 MG RE SUPP: 650.0000 mg | Freq: Four times a day (QID) | RECTAL | Status: DC | PRN

## 2014-04-02 MED ORDER — ALBUTEROL SULFATE (2.5 MG/3ML) 0.083% IN NEBU
2.5000 mg | INHALATION_SOLUTION | Freq: Once | RESPIRATORY_TRACT | Status: AC
  Administered 2014-04-02: 2.5 mg via RESPIRATORY_TRACT
  Filled 2014-04-02: qty 3

## 2014-04-02 MED ORDER — ENOXAPARIN SODIUM 40 MG/0.4ML ~~LOC~~ SOLN
40.0000 mg | SUBCUTANEOUS | Status: DC
  Administered 2014-04-02 – 2014-04-08 (×7): 40 mg via SUBCUTANEOUS
  Filled 2014-04-02 (×7): qty 0.4

## 2014-04-02 MED ORDER — IPRATROPIUM BROMIDE 0.02 % IN SOLN: 0.5000 mg | Freq: Four times a day (QID) | RESPIRATORY_TRACT | Status: DC

## 2014-04-02 MED ORDER — ACETAMINOPHEN 325 MG PO TABS: 650.0000 mg | ORAL_TABLET | Freq: Four times a day (QID) | ORAL | Status: DC | PRN

## 2014-04-02 MED ORDER — METHYLPREDNISOLONE SODIUM SUCC 125 MG IJ SOLR
125.0000 mg | Freq: Once | INTRAMUSCULAR | Status: AC
  Administered 2014-04-02: 125 mg via INTRAVENOUS
  Filled 2014-04-02: qty 2

## 2014-04-02 MED ORDER — ALBUTEROL SULFATE (2.5 MG/3ML) 0.083% IN NEBU: 2.5000 mg | INHALATION_SOLUTION | RESPIRATORY_TRACT | Status: DC | PRN

## 2014-04-02 MED ORDER — ALBUTEROL SULFATE (2.5 MG/3ML) 0.083% IN NEBU
2.5000 mg | INHALATION_SOLUTION | RESPIRATORY_TRACT | Status: DC | PRN
Start: 2014-04-02 — End: 2014-04-02

## 2014-04-02 MED ORDER — AZITHROMYCIN 250 MG PO TABS
500.0000 mg | ORAL_TABLET | Freq: Once | ORAL | Status: AC
  Administered 2014-04-02: 500 mg via ORAL
  Filled 2014-04-02 (×2): qty 2

## 2014-04-02 MED ORDER — FUROSEMIDE 40 MG PO TABS
40.0000 mg | ORAL_TABLET | Freq: Every day | ORAL | Status: DC
  Administered 2014-04-03: 40 mg via ORAL
  Filled 2014-04-02: qty 1

## 2014-04-02 MED ORDER — ASPIRIN EC 81 MG PO TBEC
81.0000 mg | DELAYED_RELEASE_TABLET | Freq: Every day | ORAL | Status: DC
  Administered 2014-04-02 – 2014-04-09 (×8): 81 mg via ORAL
  Filled 2014-04-02 (×8): qty 1

## 2014-04-02 MED ORDER — GLIMEPIRIDE 2 MG PO TABS
4.0000 mg | ORAL_TABLET | Freq: Every day | ORAL | Status: DC
  Administered 2014-04-03 – 2014-04-09 (×7): 4 mg via ORAL
  Filled 2014-04-02 (×7): qty 2

## 2014-04-02 MED ORDER — PREDNISONE 50 MG PO TABS
60.0000 mg | ORAL_TABLET | Freq: Once | ORAL | Status: AC
  Administered 2014-04-02: 60 mg via ORAL
  Filled 2014-04-02 (×2): qty 1

## 2014-04-02 MED ORDER — PREDNISONE 50 MG PO TABS: ORAL_TABLET | ORAL | Status: DC

## 2014-04-02 MED ORDER — AMLODIPINE BESYLATE 5 MG PO TABS
5.0000 mg | ORAL_TABLET | Freq: Every day | ORAL | Status: DC
  Administered 2014-04-02 – 2014-04-06 (×5): 5 mg via ORAL
  Filled 2014-04-02 (×5): qty 1

## 2014-04-02 MED ORDER — DEXTROSE 5 % IV SOLN
500.0000 mg | INTRAVENOUS | Status: DC
  Filled 2014-04-02 (×2): qty 500

## 2014-04-02 MED ORDER — CITALOPRAM HYDROBROMIDE 20 MG PO TABS
10.0000 mg | ORAL_TABLET | Freq: Every day | ORAL | Status: DC
  Administered 2014-04-02 – 2014-04-09 (×8): 10 mg via ORAL
  Filled 2014-04-02 (×8): qty 1

## 2014-04-02 MED ORDER — ATORVASTATIN CALCIUM 20 MG PO TABS
20.0000 mg | ORAL_TABLET | Freq: Every day | ORAL | Status: DC
  Administered 2014-04-02 – 2014-04-08 (×7): 20 mg via ORAL
  Filled 2014-04-02 (×7): qty 1

## 2014-04-02 MED ORDER — GABAPENTIN 400 MG PO CAPS
800.0000 mg | ORAL_CAPSULE | Freq: Three times a day (TID) | ORAL | Status: DC
  Administered 2014-04-02 – 2014-04-09 (×19): 800 mg via ORAL
  Filled 2014-04-02 (×20): qty 2

## 2014-04-02 MED ORDER — AZITHROMYCIN 250 MG PO TABS: ORAL_TABLET | ORAL | Status: DC

## 2014-04-02 MED ORDER — ALBUTEROL SULFATE (2.5 MG/3ML) 0.083% IN NEBU: 2.5000 mg | INHALATION_SOLUTION | Freq: Four times a day (QID) | RESPIRATORY_TRACT | Status: DC

## 2014-04-02 MED ORDER — GUAIFENESIN ER 600 MG PO TB12
600.0000 mg | ORAL_TABLET | Freq: Two times a day (BID) | ORAL | Status: DC
  Administered 2014-04-02 – 2014-04-07 (×10): 600 mg via ORAL
  Filled 2014-04-02 (×10): qty 1

## 2014-04-02 MED ORDER — HYDROCODONE-ACETAMINOPHEN 10-325 MG PO TABS
1.0000 | ORAL_TABLET | Freq: Three times a day (TID) | ORAL | Status: DC
  Administered 2014-04-02 – 2014-04-05 (×8): 1 via ORAL
  Filled 2014-04-02 (×8): qty 1

## 2014-04-02 MED ORDER — ALUM & MAG HYDROXIDE-SIMETH 200-200-20 MG/5ML PO SUSP: 30.0000 mL | Freq: Four times a day (QID) | ORAL | Status: DC | PRN

## 2014-04-02 MED ORDER — DIAZEPAM 5 MG PO TABS
10.0000 mg | ORAL_TABLET | Freq: Three times a day (TID) | ORAL | Status: DC
  Administered 2014-04-02 – 2014-04-07 (×14): 10 mg via ORAL
  Filled 2014-04-02 (×15): qty 2

## 2014-04-02 MED ORDER — METFORMIN HCL 500 MG PO TABS
1000.0000 mg | ORAL_TABLET | Freq: Two times a day (BID) | ORAL | Status: DC
  Administered 2014-04-03 – 2014-04-09 (×13): 1000 mg via ORAL
  Filled 2014-04-02 (×14): qty 2

## 2014-04-02 NOTE — ED Notes (Signed)
MD at bedside. 

## 2014-04-02 NOTE — ED Notes (Signed)
Respiratory paged x2, contact made and respiratory reports they are on the way to treat pt.

## 2014-04-02 NOTE — ED Notes (Signed)
Attempted to call report on pt; incorrect bed assignment

## 2014-04-02 NOTE — ED Provider Notes (Signed)
CSN: [id number]     Arrival date & time 04/02/14  1107 History  This chart was scribed for Nat Christen, MD by Steva Colder, ED Scribe. The patient was seen in room APA17/APA17 at 11:38 AM.     Chief Complaint  Patient presents with  . Shortness of Breath     The history is provided by the patient. No language interpreter was used.   HPI Comments: Eric Scott is a 63 y.o. male who presents to the Emergency Department complaining of SOB onset 3 days. He states that this is unusual for him. He denies having asthma or COPD.  He states that he is having associated symptoms of wheezing, CP. He denies any other symptoms. He denies picking up a infection in his lungs from someone. He states that he stays in a rest home. PCP-HASANAJ,XAJE A, MD. He states that he is a smoker.    Past Medical History  Diagnosis Date  . Hypertension   . Hypercholesteremia   . Type II diabetes mellitus   . Diabetic peripheral neuropathy     "chronic" (05/27/2012)  . Stroke 05/2012    Subacute, lacunar infarcts within the left basal ganglia and posterior limp of the left internal capsule/thalamus; "RUE; both feet weak" (05/27/2012)  . Anxiety   . Spinal stenosis     mild lumbar (MRI 05/2012)-L2-L3 to L4-L5 , mild lumbar foraminal stenosis   . Chronic pain     legs, back; MRI 05/2012 with mild thoracic degenerative changes no spinal stenosis   . Depression   . PVD (peripheral vascular disease)   . Peripheral edema    Past Surgical History  Procedure Laterality Date  . Hernia repair  01/05/2004    "belly button" (05/27/2012)  . Hernia repair  01/05/2004  . Ankle surgery     No family history on file. History  Substance Use Topics  . Smoking status: Current Every Day Smoker -- 2.00 packs/day for 45 years    Types: Cigarettes  . Smokeless tobacco: Never Used  . Alcohol Use: No     Comment: 05/27/2012 "drank gallons and gallons 20 yr ago or so; last drink  at least 10 yr ago"    Review of Systems   Respiratory: Positive for shortness of breath.   All other systems reviewed and are negative.     Allergies  Cucumber extract; Flexeril; and Kiwi extract  Home Medications   Prior to Admission medications   Medication Sig Start Date End Date Taking? Authorizing Provider  amLODipine (NORVASC) 5 MG tablet Take 5 mg by mouth daily.   Yes Historical Provider, MD  aspirin EC 81 MG tablet Take 81 mg by mouth daily.   Yes Historical Provider, MD  atorvastatin (LIPITOR) 20 MG tablet Take 20 mg by mouth daily.    Yes Historical Provider, MD  calcium-vitamin D (OSCAL WITH D) 500-200 MG-UNIT per tablet Take 1 tablet by mouth 3 (three) times daily.   Yes Historical Provider, MD  citalopram (CELEXA) 10 MG tablet Take 10 mg by mouth daily.   Yes Historical Provider, MD  diazepam (VALIUM) 10 MG tablet Take 10 mg by mouth 3 (three) times daily.   Yes Historical Provider, MD  fish oil-omega-3 fatty acids 1000 MG capsule Take 1 g by mouth 2 (two) times daily.   Yes Historical Provider, MD  furosemide (LASIX) 40 MG tablet Take 40 mg by mouth daily.   Yes Historical Provider, MD  gabapentin (NEURONTIN) 800 MG tablet Take 800 mg  by mouth 3 (three) times daily.   Yes Historical Provider, MD  glimepiride (AMARYL) 4 MG tablet Take 4 mg by mouth daily.    Yes Historical Provider, MD  HYDROcodone-acetaminophen (NORCO) 10-325 MG per tablet Take 1 tablet by mouth 3 (three) times daily.   Yes Historical Provider, MD  metFORMIN (GLUCOPHAGE) 500 MG tablet Take 1,000 mg by mouth 2 (two) times daily with a meal.   Yes Historical Provider, MD  triamcinolone cream (KENALOG) 0.1 % Apply 1 application topically daily.   Yes Historical Provider, MD  azithromycin (ZITHROMAX) 250 MG tablet 1 tablet daily starting tomorrow afternoon for 4 days 04/02/14   Nat Christen, MD  predniSONE (DELTASONE) 50 MG tablet 1 tablet daily for 5 days, one half tablet daily for 5 days 04/02/14   Nat Christen, MD   BP 127/68  Pulse 69  Temp(Src)  97.9 F (36.6 C) (Oral)  Resp 14  SpO2 91%  Physical Exam  Nursing note and vitals reviewed. Constitutional: He is oriented to person, place, and time. He appears well-developed and well-nourished.  Body habitus  HENT:  Head: Normocephalic and atraumatic.  Eyes: Conjunctivae and EOM are normal. Pupils are equal, round, and reactive to light.  Neck: Normal range of motion. Neck supple.  Cardiovascular: Normal rate, regular rhythm and normal heart sounds.   Pulmonary/Chest: Effort normal. He has wheezes (bilateral ).  Abdominal: Soft. Bowel sounds are normal.  Musculoskeletal: Normal range of motion.  Neurological: He is alert and oriented to person, place, and time.  Skin: Skin is warm and dry.  Psychiatric: He has a normal mood and affect. His behavior is normal.    ED Course  Procedures (including critical care time) DIAGNOSTIC STUDIES: Oxygen Saturation is 91% on room air, low by my interpretation.    COORDINATION OF CARE: 11:42 AM-Discussed treatment plan c pt which includes chest x-ray, nebulizer treatment    Labs Review Labs Reviewed  COMPREHENSIVE METABOLIC PANEL - Abnormal; Notable for the following:    GFR calc non Af Amer 65 (*)    GFR calc Af Amer 76 (*)    All other components within normal limits  CBC WITH DIFFERENTIAL - Abnormal; Notable for the following:    RBC 3.94 (*)    Hemoglobin 12.8 (*)    MCV 100.5 (*)    All other components within normal limits  TROPONIN I    Imaging Review Dg Chest 2 View  04/02/2014   CLINICAL DATA:  63 year old male with productive cough and acute mid sternal chest pain with shortness of Breath. Initial encounter.  EXAM: CHEST  2 VIEW  COMPARISON:  12/17/2013 and earlier.  FINDINGS: Seated upright AP and lateral views at 1246 hrs. Large body habitus. Stable cardiomegaly and mediastinal contours. Visualized tracheal air column is within normal limits. Stable lung parenchyma. No pneumothorax, pulmonary edema, pleural effusion or  consolidation. There is mild streaky opacity increased at the will right base only on the frontal view, most resembles atelectasis. No acute osseous abnormality identified.  IMPRESSION: Suspect mild right basilar atelectasis, otherwise no acute cardiopulmonary abnormality.   Electronically Signed   By: Lars Pinks M.D.   On: 04/02/2014 13:03     EKG Interpretation   Date/Time:  Friday April 02 2014 11:21:34 EDT Ventricular Rate:  68 PR Interval:  166 QRS Duration: 98 QT Interval:  402 QTC Calculation: 427 R Axis:   -22 Text Interpretation:  Sinus rhythm with Fusion complexes Low voltage QRS  Septal infarct , age  undetermined Abnormal ECG Confirmed by Lacinda Axon  MD,  Traniyah Hallett ([id number]) on 04/02/2014 1:04:47 PM      MDM   Final diagnoses:  Cough  Bronchitis  Asthma, unspecified asthma severity, with acute exacerbation    Chest x-ray shows no obvious pneumonia. Antibiotics started in the emergency department. Albuterol/Atrovent nebulizer treatment.   Discharge medications Zithromax, prednisone  I personally performed the services described in this documentation, which was scribed in my presence. The recorded information has been reviewed and is accurate.    Nat Christen, MD 04/02/14 [phone]

## 2014-04-02 NOTE — ED Provider Notes (Signed)
Pt initially up for discharge, but then subsequently noted to desat to high 80s% while still sitting in bed without supplemental oxygen. No home oxygen requirement. Will admit.   Virgel Manifold, MD 04/08/14 [phone]

## 2014-04-02 NOTE — H&P (Signed)
History and Physical  Eric Scott [name] DOB: 1951-01-16 DOA: 04/02/2014  Referring physician: Dr Wilson Singer, ED physician PCP: [name], MD   Chief Complaint: dyspnea  HPI: Eric Scott is a 63 y.o. male  With a history of diabetes, hypertension, or lipidemia, chronic pain, leg swelling. He is a resident at [facility]. He became dyspneic with wheezing that started 3 days ago and has been gradually becoming worse since that time. He has had a cough with. Sputum production. He denies fevers. His dyspnea is not worse with ambulation. No palliating factors.   Review of Systems:   Pt complains of pleurisy, chest wall pain.  Pt denies any fevers, chills, nausea, vomiting, palpitations, abdominal pain.  Review of systems are otherwise negative  Past Medical History  Diagnosis Date  . Hypertension   . Hypercholesteremia   . Type II diabetes mellitus   . Diabetic peripheral neuropathy     "chronic" (05/27/2012)  . Stroke 05/2012    Subacute, lacunar infarcts within the left basal ganglia and posterior limp of the left internal capsule/thalamus; "RUE; both feet weak" (05/27/2012)  . Anxiety   . Spinal stenosis     mild lumbar (MRI 05/2012)-L2-L3 to L4-L5 , mild lumbar foraminal stenosis   . Chronic pain     legs, back; MRI 05/2012 with mild thoracic degenerative changes no spinal stenosis   . Depression   . PVD (peripheral vascular disease)   . Peripheral edema    Past Surgical History  Procedure Laterality Date  . Hernia repair  01/05/2004    "belly button" (05/27/2012)  . Hernia repair  01/05/2004  . Ankle surgery     Social History:  reports that he has been smoking Cigarettes.  He has a 90 pack-year smoking history. He has never used smokeless tobacco. He reports that he does not drink alcohol or use illicit drugs.  Patient lives at [facility] Reactions  . Cucumber Extract Other (See Comments)    "Fells like I'm having a  heart attack"  . Flexeril [Cyclobenzaprine Hcl] Other (See Comments)    "whole body  Tremors" (05/27/2012)  . Kiwi Extract Other (See Comments)    "feels like I'm having a heart attack"    No family history on file.    Prior to Admission medications   Medication Sig Start Date End Date Taking? Authorizing Provider  amLODipine (NORVASC) 5 MG tablet Take 5 mg by mouth daily.   Yes Historical Provider, MD  aspirin EC 81 MG tablet Take 81 mg by mouth daily.   Yes Historical Provider, MD  atorvastatin (LIPITOR) 20 MG tablet Take 20 mg by mouth daily.    Yes Historical Provider, MD  calcium-vitamin D (OSCAL WITH D) 500-200 MG-UNIT per tablet Take 1 tablet by mouth 3 (three) times daily.   Yes Historical Provider, MD  citalopram (CELEXA) 10 MG tablet Take 10 mg by mouth daily.   Yes Historical Provider, MD  diazepam (VALIUM) 10 MG tablet Take 10 mg by mouth 3 (three) times daily.   Yes Historical Provider, MD  fish oil-omega-3 fatty acids 1000 MG capsule Take 1 g by mouth 2 (two) times daily.   Yes Historical Provider, MD  furosemide (LASIX) 40 MG tablet Take 40 mg by mouth daily.   Yes Historical Provider, MD  gabapentin (NEURONTIN) 800 MG tablet Take 800 mg by mouth 3 (three) times daily.   Yes Historical Provider, MD  glimepiride (AMARYL) 4 MG tablet  Take 4 mg by mouth daily.    Yes Historical Provider, MD  HYDROcodone-acetaminophen (NORCO) 10-325 MG per tablet Take 1 tablet by mouth 3 (three) times daily.   Yes Historical Provider, MD  metFORMIN (GLUCOPHAGE) 500 MG tablet Take 1,000 mg by mouth 2 (two) times daily with a meal.   Yes Historical Provider, MD  triamcinolone cream (KENALOG) 0.1 % Apply 1 application topically daily.   Yes Historical Provider, MD  azithromycin (ZITHROMAX) 250 MG tablet 1 tablet daily starting tomorrow afternoon for 4 days 04/02/14   Nat Christen, MD  predniSONE (DELTASONE) 50 MG tablet 1 tablet daily for 5 days, one half tablet daily for 5 days 04/02/14   Nat Christen,  MD    Physical Exam: BP 137/68  Pulse 84  Temp(Src) 97.9 F (36.6 C) (Oral)  Resp 15  SpO2 92%  General: Elderly Caucasian male. Awake and alert and oriented x3. No acute cardiopulmonary distress.  Eyes: Pupils equal, round, reactive to light. Extraocular muscles are intact. Sclerae anicteric and noninjected.  ENT: External auditory canals are patent and tympanic membranes reflect a good cone of light. Moist mucosal membranes. No mucosal lesions.  Neck: Neck supple without lymphadenopathy. No carotid bruits. No masses palpated.  Cardiovascular: Regular rate with normal S1-S2 sounds. No murmurs, rubs, gallops auscultated. No JVD.  Respiratory: Prolonged exhalation phase. Coarse wheezes throughout. Decreased breath sounds throughout  Abdomen: Soft, nontender, nondistended. Hyperactive bowel sounds. No masses or hepatosplenomegaly  Skin: Dry, warm to touch. 2+ dorsalis pedis and radial pulses. Lower extremities are wrapped due to chronic edema Musculoskeletal: No calf or leg pain. All major joints not erythematous nontender.  Psychiatric: Intact judgment and insight.  Neurologic: No focal neurological deficits. Cranial nerves II through XII are grossly intact.           Labs on Admission:  Basic Metabolic Panel:  Recent Labs Lab 04/02/14 1215  NA 146  K 4.1  CL 105  CO2 30  GLUCOSE 94  BUN 20  CREATININE 1.16  CALCIUM 9.1   Liver Function Tests:  Recent Labs Lab 04/02/14 1215  AST 17  ALT 9  ALKPHOS 71  BILITOT 0.5  PROT 7.9  ALBUMIN 3.5   No results found for this basename: LIPASE, AMYLASE,  in the last 168 hours No results found for this basename: AMMONIA,  in the last 168 hours CBC:  Recent Labs Lab 04/02/14 1145  WBC 9.8  NEUTROABS 6.6  HGB 12.8*  HCT 39.6  MCV 100.5*  PLT 280   Cardiac Enzymes:  Recent Labs Lab 04/02/14 1215  TROPONINI <0.30    BNP (last 3 results) No results found for this basename: PROBNP,  in the last 8760  hours CBG: No results found for this basename: GLUCAP,  in the last 168 hours  Radiological Exams on Admission: Dg Chest 2 View  04/02/2014   CLINICAL DATA:  63 year old male with productive cough and acute mid sternal chest pain with shortness of Breath. Initial encounter.  EXAM: CHEST  2 VIEW  COMPARISON:  12/17/2013 and earlier.  FINDINGS: Seated upright AP and lateral views at 1246 hrs. Large body habitus. Stable cardiomegaly and mediastinal contours. Visualized tracheal air column is within normal limits. Stable lung parenchyma. No pneumothorax, pulmonary edema, pleural effusion or consolidation. There is mild streaky opacity increased at the will right base only on the frontal view, most resembles atelectasis. No acute osseous abnormality identified.  IMPRESSION: Suspect mild right basilar atelectasis, otherwise no acute cardiopulmonary abnormality.  Electronically Signed   By: Lars Pinks M.D.   On: 04/02/2014 13:03     Assessment/Plan Present on Admission:  . COPD with exacerbation . Hypoxia  #1 COPD with exacerbation #2 hypoxia New diagnosis of COPD with acute exacerbation. Admit to medical floor. Will continue with oxygen therapy with IV steroids and IV antibiotics. Continue nebulizer treatments of albuterol and Atrovent.   DVT prophylaxis: Lovenox  Consultants: None  Code Status: Full  Family Communication: None   Disposition Plan: Return to [place] following stabilization  Time spent: 50 minutes  [place], [place] [facility] Pager [phone]  **Disclaimer: This note may have been dictated with voice recognition software. Similar sounding words can inadvertently be transcribed and this note may contain transcription errors which may not have been corrected upon publication of note.**

## 2014-04-02 NOTE — ED Notes (Signed)
Respiratory at bedside.

## 2014-04-02 NOTE — ED Notes (Signed)
Dr. Wilson Singer made aware of oxygen sat dropping to 88% when pt is taken off of oxygen. Per Dr. Wilson Singer, pt is now being admitted.

## 2014-04-02 NOTE — ED Notes (Signed)
Complain of being SOB for three days. Denies other symptoms

## 2014-04-02 NOTE — Discharge Instructions (Signed)
Chest x-ray shows no obvious pneumonia. Prescription for antibiotic to start tomorrow, first dose given in the emergency department.    Rx for prednisone.   Inhaler given. No smoking

## 2014-04-03 ENCOUNTER — Inpatient Hospital Stay (HOSPITAL_COMMUNITY): Payer: Medicare Other

## 2014-04-03 DIAGNOSIS — R05 Cough: Secondary | ICD-10-CM

## 2014-04-03 LAB — MRSA PCR SCREENING: MRSA by PCR: POSITIVE — AB

## 2014-04-03 LAB — CBC
HEMATOCRIT: 37.1 % — AB (ref 39.0–52.0)
HEMOGLOBIN: 12.3 g/dL — AB (ref 13.0–17.0)
MCH: 32.5 pg (ref 26.0–34.0)
MCHC: 33.2 g/dL (ref 30.0–36.0)
MCV: 97.9 fL (ref 78.0–100.0)
Platelets: 303 10*3/uL (ref 150–400)
RBC: 3.79 MIL/uL — ABNORMAL LOW (ref 4.22–5.81)
RDW: 14.1 % (ref 11.5–15.5)
WBC: 9.7 10*3/uL (ref 4.0–10.5)

## 2014-04-03 LAB — BASIC METABOLIC PANEL
ANION GAP: 13 (ref 5–15)
BUN: 25 mg/dL — ABNORMAL HIGH (ref 6–23)
CHLORIDE: 104 meq/L (ref 96–112)
CO2: 28 meq/L (ref 19–32)
Calcium: 8.9 mg/dL (ref 8.4–10.5)
Creatinine, Ser: 1.02 mg/dL (ref 0.50–1.35)
GFR calc non Af Amer: 76 mL/min — ABNORMAL LOW (ref >90)
GFR, EST AFRICAN AMERICAN: 88 mL/min — AB (ref >90)
Glucose, Bld: 196 mg/dL — ABNORMAL HIGH (ref 70–99)
Potassium: 4.4 mEq/L (ref 3.7–5.3)
Sodium: 145 mEq/L (ref 137–147)

## 2014-04-03 MED ORDER — MORPHINE SULFATE 2 MG/ML IJ SOLN
2.0000 mg | INTRAMUSCULAR | Status: DC | PRN
  Administered 2014-04-03 – 2014-04-05 (×9): 2 mg via INTRAVENOUS
  Filled 2014-04-03 (×9): qty 1

## 2014-04-03 MED ORDER — MOMETASONE FURO-FORMOTEROL FUM 200-5 MCG/ACT IN AERO
2.0000 | INHALATION_SPRAY | Freq: Two times a day (BID) | RESPIRATORY_TRACT | Status: DC
  Administered 2014-04-03 – 2014-04-09 (×12): 2 via RESPIRATORY_TRACT
  Filled 2014-04-03: qty 8.8

## 2014-04-03 MED ORDER — AZITHROMYCIN 250 MG PO TABS
500.0000 mg | ORAL_TABLET | Freq: Every day | ORAL | Status: DC
  Administered 2014-04-03 – 2014-04-09 (×7): 500 mg via ORAL
  Filled 2014-04-03 (×7): qty 2

## 2014-04-03 MED ORDER — MUPIROCIN 2 % EX OINT
1.0000 "application " | TOPICAL_OINTMENT | Freq: Two times a day (BID) | CUTANEOUS | Status: AC
  Administered 2014-04-03 – 2014-04-07 (×9): 1 via NASAL
  Filled 2014-04-03 (×3): qty 22

## 2014-04-03 MED ORDER — CHLORHEXIDINE GLUCONATE CLOTH 2 % EX PADS
6.0000 | MEDICATED_PAD | Freq: Every day | CUTANEOUS | Status: AC
  Administered 2014-04-03 – 2014-04-07 (×5): 6 via TOPICAL

## 2014-04-03 MED ORDER — PREDNISONE 20 MG PO TABS
60.0000 mg | ORAL_TABLET | Freq: Every day | ORAL | Status: DC
  Administered 2014-04-03 – 2014-04-05 (×3): 60 mg via ORAL
  Filled 2014-04-03 (×3): qty 3

## 2014-04-03 NOTE — Progress Notes (Signed)
Son states Pt has been wheezing and SOB for several years.  Son states he smokes a pack per day. Son states the SOB worsened but pt refused to be taken to the hospital at first. Son states Father has been taking hydrocodone for years with little effect.  Will continue to monitor.

## 2014-04-03 NOTE — Progress Notes (Signed)
Pt SOB whilst eating at rest.  Pt ate slowly, O2 sat checked and was 96% on 2 l/min [place].  Pt coughing up clear, white mucous.  Will continue to monitor.  Bed in lowest position, call bell and phone in reach.

## 2014-04-03 NOTE — Progress Notes (Signed)
Patient new admit to room 322. Patient alert and oriented. Expiratory wheezing noted in upper lobes. No coughing. Patient does have compression wraps to bilateral lower exts. Patient refuse to have remove for assessment, stated wound nurse said under no situation let anyone remove them. Dr. Renard Hamper is aware. Patient did complain of pain and schedule medicine given. Abdomen soft nontender with active bowel sounds.   Marland Kitchen

## 2014-04-03 NOTE — Progress Notes (Signed)
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 difficile, questionable urinary stones, prior right foot ulcer admitted overnight with shortness of breath. Chest x-ray on admission showed atelectasis He states that he started having abdominal pain but 3-4 days ago and had some sick contacts. He developed increasing shortness of breath to the point that he had come to the emergency room . He was about to leave the emergency room and he said 73 percentile and was admitted overnight  Past medical history-As per Problem list Chart reviewed as below- Reviewed  Consultants:  None  Procedures:  Chest x-ray  Antibiotics:  Azithromycin 10/30   Subjective  States he has abdominal pain. States he's had this for 3 days No worsening or relieving factors. has passed stool and this is formed No nausea no vomiting States shortness of breath is maybe a little better but feels rough overall and still poorly compared to prior   Objective    Interim History: None  Telemetry: Sinus tach   Objective: Filed Vitals:   04/02/14 2206 04/03/14 0416 04/03/14 0842 04/03/14 0938  BP:  140/67    Pulse:  75    Temp:  97.8 F (36.6 C)    TempSrc:  Oral    Resp:  16    Height:      Weight:      SpO2: 96% 94% 86% 96%    Intake/Output Summary (Last 24 hours) at 04/03/14 1048 Last data filed at 04/03/14 0417  Gross per 24 hour  Intake      0 ml  Output    450 ml  Net   -450 ml    Exam:  General: Alert pleasant oriented no apparent distress Cardiovascular: S1-S2 no murmur rub or gallop Respiratory: Wheezes noted bilaterally no fremitus resonance however Abdomen: Slightly tender in the lower quadrant Skin no lower extremity edema Neuro grossly intact  Data Reviewed: Basic Metabolic  Panel:  Recent Labs Lab 04/02/14 1215 04/03/14 0600  NA 146 145  K 4.1 4.4  CL 105 104  CO2 30 28  GLUCOSE 94 196*  BUN 20 25*  CREATININE 1.16 1.02  CALCIUM 9.1 8.9   Liver Function Tests:  Recent Labs Lab 04/02/14 1215  AST 17  ALT 9  ALKPHOS 71  BILITOT 0.5  PROT 7.9  ALBUMIN 3.5   No results found for this basename: LIPASE, AMYLASE,  in the last 168 hours No results found for this basename: AMMONIA,  in the last 168 hours CBC:  Recent Labs Lab 04/02/14 1145 04/03/14 0600  WBC 9.8 9.7  NEUTROABS 6.6  --   HGB 12.8* 12.3*  HCT 39.6 37.1*  MCV 100.5* 97.9  PLT 280 303   Cardiac Enzymes:  Recent Labs Lab 04/02/14 1215  TROPONINI <0.30   BNP: No components found with this basename: POCBNP,  CBG: No results found for this basename: GLUCAP,  in the last 168 hours  No results found for this or any previous visit (from the past 240 hour(s)).   Studies:              All Imaging reviewed and is as per above notation   Scheduled Meds: . amLODipine  5 mg Oral Daily  . aspirin EC  81 mg Oral Daily  . atorvastatin  20 mg Oral  q1800  . azithromycin  500 mg Intravenous Q24H  . citalopram  10 mg Oral Daily  . diazepam  10 mg Oral TID  . enoxaparin (LOVENOX) injection  40 mg Subcutaneous Q24H  . furosemide  40 mg Oral Daily  . gabapentin  800 mg Oral TID  . glimepiride  4 mg Oral Daily  . guaiFENesin  600 mg Oral BID  . HYDROcodone-acetaminophen  1 tablet Oral 3 times per day  . ipratropium-albuterol  3 mL Nebulization Q6H  . metFORMIN  1,000 mg Oral BID WC   Continuous Infusions:    Assessment/Plan: 1. 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 He was switched from IV Solu-Medrol to prednisone 60 daily which she will have as a burst. He will need PFTs as an outpatient. Added Dulera 500 twice a day on  10/31 2. Abdominal pain-undifferentiated. Unclear etiology. Get acute abdominal series. LFTs are normal. Suspect this may be chronic as he is on Norco 3 times a day. I will add 1-2 mg of morphine every 4 when necessary and reevaluate him with acute series 3. Potential heart failure-no echo and system. Continue Lasix 40 mg daily. Outpatient evaluation 4. Diabetes mellitus type 2-continue Amaryl 4 mg daily, metformin 1000 twice a day. 5. Hypertension continue Norvasc 5 daily. Blood pressure is moderately well controlled 6. Anxiety-continue diazepam 10 mg 3 times a day. Will discuss down titration of this with him in the morning and citalopram 10 mg daily 7. Hyperlipidemia continue atorvastatin 20 every afternoon 8. Hyperlipidemia continue atorvastatin 20 every afternoon  Code Status: Full Family Communication: None bedside Disposition Plan: Inpatient   Verneita Griffes, MD  [facility] Pager [phone] 04/03/2014, 10:48 AM    LOS: 1 day

## 2014-04-04 DIAGNOSIS — R911 Solitary pulmonary nodule: Secondary | ICD-10-CM

## 2014-04-04 DIAGNOSIS — I509 Heart failure, unspecified: Secondary | ICD-10-CM

## 2014-04-04 DIAGNOSIS — F4323 Adjustment disorder with mixed anxiety and depressed mood: Secondary | ICD-10-CM

## 2014-04-04 DIAGNOSIS — E1310 Other specified diabetes mellitus with ketoacidosis without coma: Secondary | ICD-10-CM

## 2014-04-04 DIAGNOSIS — I10 Essential (primary) hypertension: Secondary | ICD-10-CM

## 2014-04-04 DIAGNOSIS — Z87891 Personal history of nicotine dependence: Secondary | ICD-10-CM

## 2014-04-04 LAB — CBC WITH DIFFERENTIAL/PLATELET
BASOS ABS: 0 10*3/uL (ref 0.0–0.1)
Basophils Relative: 0 % (ref 0–1)
Eosinophils Absolute: 0 10*3/uL (ref 0.0–0.7)
Eosinophils Relative: 0 % (ref 0–5)
HEMATOCRIT: 36.3 % — AB (ref 39.0–52.0)
HEMOGLOBIN: 11.8 g/dL — AB (ref 13.0–17.0)
LYMPHS ABS: 1.4 10*3/uL (ref 0.7–4.0)
LYMPHS PCT: 10 % — AB (ref 12–46)
MCH: 32.1 pg (ref 26.0–34.0)
MCHC: 32.5 g/dL (ref 30.0–36.0)
MCV: 98.6 fL (ref 78.0–100.0)
MONO ABS: 0.8 10*3/uL (ref 0.1–1.0)
MONOS PCT: 6 % (ref 3–12)
Neutro Abs: 12.6 10*3/uL — ABNORMAL HIGH (ref 1.7–7.7)
Neutrophils Relative %: 84 % — ABNORMAL HIGH (ref 43–77)
Platelets: 300 10*3/uL (ref 150–400)
RBC: 3.68 MIL/uL — ABNORMAL LOW (ref 4.22–5.81)
RDW: 14.3 % (ref 11.5–15.5)
WBC: 14.9 10*3/uL — AB (ref 4.0–10.5)

## 2014-04-04 LAB — COMPREHENSIVE METABOLIC PANEL
ALT: 7 U/L (ref 0–53)
AST: 11 U/L (ref 0–37)
Albumin: 3.2 g/dL — ABNORMAL LOW (ref 3.5–5.2)
Alkaline Phosphatase: 59 U/L (ref 39–117)
Anion gap: 11 (ref 5–15)
BILIRUBIN TOTAL: 0.3 mg/dL (ref 0.3–1.2)
BUN: 32 mg/dL — ABNORMAL HIGH (ref 6–23)
CHLORIDE: 105 meq/L (ref 96–112)
CO2: 29 mEq/L (ref 19–32)
CREATININE: 1.17 mg/dL (ref 0.50–1.35)
Calcium: 8.7 mg/dL (ref 8.4–10.5)
GFR, EST AFRICAN AMERICAN: 75 mL/min — AB (ref >90)
GFR, EST NON AFRICAN AMERICAN: 65 mL/min — AB (ref >90)
GLUCOSE: 144 mg/dL — AB (ref 70–99)
Potassium: 4.1 mEq/L (ref 3.7–5.3)
Sodium: 145 mEq/L (ref 137–147)
Total Protein: 7.2 g/dL (ref 6.0–8.3)

## 2014-04-04 MED ORDER — FUROSEMIDE 20 MG PO TABS
20.0000 mg | ORAL_TABLET | Freq: Every day | ORAL | Status: DC
Start: 2014-04-04 — End: 2014-04-07
  Administered 2014-04-04 – 2014-04-06 (×3): 20 mg via ORAL
  Filled 2014-04-04 (×3): qty 1

## 2014-04-04 NOTE — Progress Notes (Signed)
Eric Scott [name] DOB: [birth year] DOA: 04/02/2014 PCP: [name], MD  Brief narrative: 63 year old male known history CVA 05/2012, diabetes mellitus type 2, tobacco abuse 2 packs per day in the past smoker since 17, prior history C. difficile, questionable urinary stones, prior right foot ulcer admitted overnight with shortness of breath. Chest x-ray on admission showed atelectasis He states that he started having abdominal pain but 3-4 days ago and had some sick contacts. He developed increasing shortness of breath to the point that he had come to the emergency room . He was about to leave the emergency room and he said 39 percentile and was admitted overnight  Past medical history-As per Problem list Chart reviewed as below- Reviewed  Consultants:  None  Procedures:  Chest x-ray  Antibiotics:  Azithromycin 10/30   Subjective   Doing fair Still SOB and only feels 10% better Still wheezy No further abd pain No CP No n/v   Objective    Interim History: None  Telemetry: Sinus tach   Objective: Filed Vitals:   04/03/14 1952 04/03/14 2100 04/04/14 0443 04/04/14 0816  BP:  109/48 132/59   Pulse:  75 75   Temp:  97.9 F (36.6 C) 97.6 F (36.4 C)   TempSrc:  Oral Oral   Resp:  20 18   Height:      Weight:   131.5 kg (289 lb 14.5 oz)   SpO2: 88% 93% 95% 91%    Intake/Output Summary (Last 24 hours) at 04/04/14 1229 Last data filed at 04/04/14 0934  Gross per 24 hour  Intake    630 ml  Output    525 ml  Net    105 ml    Exam:  General: Alert pleasant oriented no apparent distress Cardiovascular: S1-S2 no murmur rub or gallop Respiratory: Wheezes noted bilaterally which are decreased Abdomen: Slightly tender in the lower quadrant Skin no lower extremity edema Neuro grossly intact  Data Reviewed: Basic Metabolic Panel:  Recent Labs Lab 04/02/14 1215 04/03/14 0600 04/04/14 0552  NA 146 145 145  K 4.1 4.4 4.1  CL 105 104 105    CO2 30 28 29   GLUCOSE 94 196* 144*  BUN 20 25* 32*  CREATININE 1.16 1.02 1.17  CALCIUM 9.1 8.9 8.7   Liver Function Tests:  Recent Labs Lab 04/02/14 1215 04/04/14 0552  AST 17 11  ALT 9 7  ALKPHOS 71 59  BILITOT 0.5 0.3  PROT 7.9 7.2  ALBUMIN 3.5 3.2*   No results for input(s): LIPASE, AMYLASE in the last 168 hours. No results for input(s): AMMONIA in the last 168 hours. CBC:  Recent Labs Lab 04/02/14 1145 04/03/14 0600 04/04/14 0552  WBC 9.8 9.7 14.9*  NEUTROABS 6.6  --  12.6*  HGB 12.8* 12.3* 11.8*  HCT 39.6 37.1* 36.3*  MCV 100.5* 97.9 98.6  PLT 280 303 300   Cardiac Enzymes:  Recent Labs Lab 04/02/14 1215  TROPONINI <0.30   BNP: Invalid input(s): POCBNP CBG: No results for input(s): GLUCAP in the last 168 hours.  Recent Results (from the past 240 hour(s))  MRSA PCR Screening     Status: Abnormal   Collection Time: 04/03/14  9:03 AM  Result Value Ref Range Status   MRSA by PCR POSITIVE (A) NEGATIVE Final    Comment: CRITICAL RESULT CALLED TO, READ BACK BY AND VERIFIED WITH: PARRISH,N AT 10:50AM ON 04/03/14 BY Derrill Memo        The GeneXpert MRSA Assay (FDA approved  for NASAL specimens only), is one component of a comprehensive MRSA colonization surveillance program. It is not intended to diagnose MRSA infection nor to guide or monitor treatment for MRSA infections.     Studies:              All Imaging reviewed and is as per above notation   Scheduled Meds: . amLODipine  5 mg Oral Daily  . aspirin EC  81 mg Oral Daily  . atorvastatin  20 mg Oral q1800  . azithromycin  500 mg Oral Daily  . Chlorhexidine Gluconate Cloth  6 each Topical Q0600  . citalopram  10 mg Oral Daily  . diazepam  10 mg Oral TID  . enoxaparin (LOVENOX) injection  40 mg Subcutaneous Q24H  . furosemide  20 mg Oral Daily  . gabapentin  800 mg Oral TID  . glimepiride  4 mg Oral Daily  . guaiFENesin  600 mg Oral BID  . HYDROcodone-acetaminophen  1 tablet Oral 3  times per day  . ipratropium-albuterol  3 mL Nebulization Q6H  . metFORMIN  1,000 mg Oral BID WC  . mometasone-formoterol  2 puff Inhalation BID  . mupirocin ointment  1 application Nasal BID  . predniSONE  60 mg Oral Daily   Continuous Infusions:    Assessment/Plan:  1. 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 He was switched from IV Solu-Medrol to prednisone 60 daily 10/31 He will need PFTs as an outpatient. Added Dulera 500 twice a day on 10/31-he is slowly improving 2. Pulm nodule-slightly diminished pulm function-I have clearly explained to him his is a priority, but would manage and work-up as OP.  There is some concern for malignancy given his chronic prolonged smoking h/o 3. Abdominal pain-undifferentiated. Unclear etiology. No pathology in abdomen on acute series 10/31. LFTs are normal. Suspect this may be chronic as he is on Norco 3 times a day. Continue PRN morphine 4. Potential heart failure-no echo in system.  Lasix 40 mg daily-20 mg daily on 11/1 as mild AKI. Get PRo-BNP-complete eval could be performed as OP 5. Diabetes mellitus type 2-continue Amaryl 4 mg daily, metformin 1000 twice a day. 6. Hypertension continue Norvasc 5 daily. Blood pressure is moderately well controlled 7. Anxiety-continue diazepam 10 mg 3 times a day. Will discuss down titration of this with him in the morning and citalopram 10 mg daily 8. Hyperlipidemia continue atorvastatin 20 every afternoon   Code Status: Full Family Communication: d/w son at bedisde who understands-both son and patient needed explanation several times and I ensured that they were clear as to why work-up for nodule will be as OP, in terms of mild aki Disposition Plan: Inpatient   Verneita Griffes, MD  [facility] Pager [phone] 04/04/2014, 12:29 PM    LOS: 2 days

## 2014-04-04 NOTE — Plan of Care (Signed)
Problem: Consults Goal: Skin Care Protocol Initiated - if Braden Score 18 or less If consults are not indicated, leave blank or document N/A  Outcome: Not Applicable Date Met:  18/34/37 Goal: Diabetes Guidelines if Diabetic/Glucose > 140 If diabetic or lab glucose is > 140 mg/dl - Initiate Diabetes/Hyperglycemia Guidelines & Document Interventions  Outcome: Not Applicable Date Met:  35/78/97 Goal: Physical Therapy Consult Outcome: Not Applicable Date Met:  84/78/41 Goal: Occupational Therapy Consult Outcome: Not Applicable Date Met:  28/20/81 Goal: Dietician Consult Outcome: Not Applicable Date Met:  38/87/19  Problem: ICU Phase Progression Outcomes Goal: Dyspnea controlled at rest Outcome: Completed/Met Date Met:  04/04/14 Goal: Pain controlled with appropriate interventions Outcome: Progressing Goal: Flu/PneumoVaccines if indicated Outcome: Not Applicable Date Met:  59/74/71

## 2014-04-04 NOTE — Significant Event (Signed)
Called by RN about son reportedly being upset that "his father has cancer" and "we need to transfer him to [facility] for better treatment" I called and explained to son, who was in the room at the time of my examination and my discussion with father and him. I did explain to the son once again that we do not know if this is a cancer although there is a possibility given his strong smoking history. I did explain to him that at this stage she is not really candidate given mild kidney dysfunction for a CT scan of the chest with contrast but certainly may be in the next week this could be sorted out. Son insistent on having workup done and Him once again that if we see some labs in the next day or so that indicate clarification of this status then we will proceed with workup if felt clinically safe to do so. I also have explained to him that the differential is broad and may be pneumoconiosis or something else. Patient son seemed to understand the same. I'll follow-up with them in the morning  Verneita Griffes, MD [facility] [phone]

## 2014-04-04 NOTE — Plan of Care (Signed)
Problem: Phase I Progression Outcomes Goal: Dyspnea controlled at rest Outcome: Completed/Met Date Met:  04/04/14

## 2014-04-04 NOTE — Plan of Care (Signed)
Problem: ICU Phase Progression Outcomes Goal: O2 sats trending toward baseline Outcome: Completed/Met Date Met:  04/04/14     

## 2014-04-04 NOTE — Plan of Care (Signed)
Problem: Phase I Progression Outcomes Goal: Tolerating diet Outcome: Completed/Met Date Met:  04/04/14

## 2014-04-04 NOTE — Progress Notes (Signed)
Son brought pt a steak biscuit for breakfast.  Pt ate biscuit and breakfast.  Advised pt and son that pt was diabetic and he should only eat one meal per meal time as he is on  a carb-modified diet.  Son stated he would no longer bring in food.  Will continue to monitor.

## 2014-04-04 NOTE — Progress Notes (Signed)
Notified Dr Verlon Au that son had called the front desk and had asked for Father to be transferred to [facility] for better treatment for his cancer.

## 2014-04-04 NOTE — Plan of Care (Signed)
Problem: Consults Goal: COPD Patient Education (See Patient Education Module for education specifics.)  Outcome: Completed/Met Date Met:  04/04/14

## 2014-04-05 DIAGNOSIS — E11622 Type 2 diabetes mellitus with other skin ulcer: Secondary | ICD-10-CM

## 2014-04-05 DIAGNOSIS — E0840 Diabetes mellitus due to underlying condition with diabetic neuropathy, unspecified: Secondary | ICD-10-CM

## 2014-04-05 DIAGNOSIS — Z720 Tobacco use: Secondary | ICD-10-CM

## 2014-04-05 DIAGNOSIS — L97909 Non-pressure chronic ulcer of unspecified part of unspecified lower leg with unspecified severity: Secondary | ICD-10-CM

## 2014-04-05 DIAGNOSIS — I5031 Acute diastolic (congestive) heart failure: Secondary | ICD-10-CM

## 2014-04-05 DIAGNOSIS — J9601 Acute respiratory failure with hypoxia: Secondary | ICD-10-CM

## 2014-04-05 LAB — CBC WITH DIFFERENTIAL/PLATELET
BASOS ABS: 0 10*3/uL (ref 0.0–0.1)
BASOS PCT: 0 % (ref 0–1)
EOS PCT: 0 % (ref 0–5)
Eosinophils Absolute: 0 10*3/uL (ref 0.0–0.7)
HEMATOCRIT: 38.2 % — AB (ref 39.0–52.0)
Hemoglobin: 12.4 g/dL — ABNORMAL LOW (ref 13.0–17.0)
Lymphocytes Relative: 10 % — ABNORMAL LOW (ref 12–46)
Lymphs Abs: 1.3 10*3/uL (ref 0.7–4.0)
MCH: 32.2 pg (ref 26.0–34.0)
MCHC: 32.5 g/dL (ref 30.0–36.0)
MCV: 99.2 fL (ref 78.0–100.0)
MONO ABS: 0.7 10*3/uL (ref 0.1–1.0)
Monocytes Relative: 6 % (ref 3–12)
NEUTROS ABS: 10.8 10*3/uL — AB (ref 1.7–7.7)
Neutrophils Relative %: 84 % — ABNORMAL HIGH (ref 43–77)
Platelets: 290 10*3/uL (ref 150–400)
RBC: 3.85 MIL/uL — ABNORMAL LOW (ref 4.22–5.81)
RDW: 14.2 % (ref 11.5–15.5)
WBC: 12.9 10*3/uL — ABNORMAL HIGH (ref 4.0–10.5)

## 2014-04-05 LAB — COMPREHENSIVE METABOLIC PANEL
ALBUMIN: 3.2 g/dL — AB (ref 3.5–5.2)
ALT: 8 U/L (ref 0–53)
AST: 12 U/L (ref 0–37)
Alkaline Phosphatase: 62 U/L (ref 39–117)
Anion gap: 12 (ref 5–15)
BUN: 32 mg/dL — ABNORMAL HIGH (ref 6–23)
CALCIUM: 8.8 mg/dL (ref 8.4–10.5)
CO2: 29 mEq/L (ref 19–32)
CREATININE: 1.11 mg/dL (ref 0.50–1.35)
Chloride: 104 mEq/L (ref 96–112)
GFR calc Af Amer: 80 mL/min — ABNORMAL LOW (ref >90)
GFR calc non Af Amer: 69 mL/min — ABNORMAL LOW (ref >90)
Glucose, Bld: 131 mg/dL — ABNORMAL HIGH (ref 70–99)
Potassium: 4.7 mEq/L (ref 3.7–5.3)
Sodium: 145 mEq/L (ref 137–147)
Total Bilirubin: 0.4 mg/dL (ref 0.3–1.2)
Total Protein: 7.7 g/dL (ref 6.0–8.3)

## 2014-04-05 LAB — PRO B NATRIURETIC PEPTIDE: Pro B Natriuretic peptide (BNP): 1106 pg/mL — ABNORMAL HIGH (ref 0–125)

## 2014-04-05 MED ORDER — METHYLPREDNISOLONE SODIUM SUCC 125 MG IJ SOLR
80.0000 mg | Freq: Four times a day (QID) | INTRAMUSCULAR | Status: DC
  Administered 2014-04-05 – 2014-04-07 (×10): 80 mg via INTRAVENOUS
  Filled 2014-04-05 (×10): qty 2

## 2014-04-05 MED ORDER — OXYCODONE HCL 5 MG PO TABS
10.0000 mg | ORAL_TABLET | ORAL | Status: DC | PRN
  Administered 2014-04-05 – 2014-04-07 (×5): 10 mg via ORAL
  Filled 2014-04-05 (×5): qty 2

## 2014-04-05 MED ORDER — IPRATROPIUM-ALBUTEROL 0.5-2.5 (3) MG/3ML IN SOLN
3.0000 mL | Freq: Three times a day (TID) | RESPIRATORY_TRACT | Status: DC
  Administered 2014-04-06 – 2014-04-07 (×5): 3 mL via RESPIRATORY_TRACT
  Filled 2014-04-05 (×5): qty 3

## 2014-04-05 NOTE — Clinical Documentation Improvement (Signed)
Risk Factors:  Potential HF, proBNP 1100 per 11/02 progress notes.   . Document acuity --Acute --Chronic --Acute on Chronic . Document type --Diastolic --Systolic --Combined systolic and diastolic  . Due to or associated with --Cardiac or other surgery --Hypertension --Valvular disease --Rheumatic heart disease Endocarditis (valvitis) Pericarditis Myocarditis --Other (specify)   Labs: 11/02: proBNP: 1106.   Thank Sherian Maroon Documentation Specialist [phone] [email or web address]

## 2014-04-05 NOTE — Clinical Social Work Psychosocial (Signed)
Clinical Social Work Department BRIEF PSYCHOSOCIAL ASSESSMENT 04/05/2014  Patient:  Eric Scott, Eric Scott     Account Number:  [id number]     Admit date:  04/02/2014  Clinical Social Worker:  Wyatt Haste  Date/Time:  04/05/2014 11:41 AM  Referred by:  CSW  Date Referred:  04/05/2014 Referred for  ALF Placement   Other Referral:   Interview type:  Patient Other interview type:    PSYCHOSOCIAL DATA Living Status:  FACILITY Admitted from facility:  [place] Level of care:  Assisted Living Primary support name:  [place] Primary support relationship to patient:  CHILD, ADULT Degree of support available:   supportive per pt    CURRENT CONCERNS Current Concerns  Post-Acute Placement   Other Concerns:    SOCIAL WORK ASSESSMENT / PLAN CSW met with pt at bedside. Pt alert and oriented and reports he has been a resident at [facility] for about 2 years. He reports he came to ED with shortness of breath. Pt describes his best support as his son, Gerrod Maule and said he visits regularly. He indicates he and his wife separated and he was unable to live alone, so had to look at placement. Pt admits that it has been a difficult adjustment for him. CSW provided brief support. He requests to return to [facility] at d/c. Per Butch Penny at facility, pt is an extensive assist with ADLs. He ambulates with a walker and assist. Pt also has a wheelchair. He has been receiving home health through [facility] for wound care. Okay for return.   Assessment/plan status:  Psychosocial Support/Ongoing Assessment of Needs Other assessment/ plan:   Information/referral to community resources:   [place]    PATIENT'S/FAMILY'S RESPONSE TO PLAN OF CARE: Pt agreeable to return to [facility] when medically stable. CSW will continue to follow. CM aware of home health.       Benay Pike, [place]

## 2014-04-05 NOTE — Consult Note (Addendum)
WOC consult requested for leg wounds.  Talked to the patient via phone, and also [facility] who follow him for dressing changes prior to admission.  They state he has several full thickness stasis ulcers and current plan of care is hydrofiber and Una boots which are changed weekly.  Pt did not want the dressings changed today, he refused to have them removed when bedside nurses attempted to assess the sites, and states they are due to be changed on Tuesday.  [facility] confirmed this and patient states he will allow dressings to be changed tomorrow;  WOC plans to perform dressing change about 0800 on Tuesday.  Una boots are not available at [facility]; will substitute Profore compression wraps while in the hospital over the silver hydrofiber and pt can resume current plan of care with [facility] after discharge. Julien Girt MSN, RN, [place], [place], [place]

## 2014-04-05 NOTE — Progress Notes (Signed)
Patient was taken off his O2 following his HHN treatment;after about 45 minutes nurse came into room and patient was 88% on room air. He was placed back his 2lpm [place] and saturation returned to 96%.

## 2014-04-05 NOTE — Progress Notes (Signed)
Eric Scott [id number] DOB: [birth year] DOA: 04/02/2014 PCP: [name], MD  Brief narrative: 63 year old male known history CVA 05/2012, diabetes mellitus type 2, tobacco abuse 2 packs per day in the past smoker since 17, prior history C. difficile, questionable urinary stones, prior right foot ulcer admitted overnight with shortness of breath. Chest x-ray on admission showed atelectasis He states that he started having abdominal pain but 3-4 days ago and had some sick contacts. He developed increasing shortness of breath to the point that he had come to the emergency room . He was about to leave the emergency room and he said 47 percentile and was admitted overnight  Past medical history-As per Problem list Chart reviewed as below- Reviewed  Consultants:  None  Procedures:  Chest x-ray  Antibiotics:  Azithromycin 10/30   Subjective   States he is 7/10 abdominal pain. Breathing does not seem too much better. No cough or sputum Focuses on his abdominal pain X No chest pain No blurred vision No double vision    Objective    Interim History: None  Telemetry: Sinus tach   Objective: Filed Vitals:   04/04/14 1911 04/05/14 0217 04/05/14 0703 04/05/14 0726  BP:    131/54  Pulse:    62  Temp:    97.4 F (36.3 C)  TempSrc:    Oral  Resp:    20  Height:      Weight:      SpO2: 93% 96% 96% 99%    Intake/Output Summary (Last 24 hours) at 04/05/14 1031 Last data filed at 04/05/14 0700  Gross per 24 hour  Intake    240 ml  Output    600 ml  Net   -360 ml    Exam:  General: Alert pleasant oriented no apparent distress Cardiovascular: S1-S2 no murmur rub or gallop Respiratory: Wheezes noted bilaterally about the same Abdomen: Slightly tender in RU quadrant Skin no lower extremity edema Neuro grossly intact  Data Reviewed: Basic Metabolic Panel:  Recent Labs Lab 04/02/14 1215 04/03/14 0600 04/04/14 0552 04/05/14 0621  NA 146 145 145 145    K 4.1 4.4 4.1 4.7  CL 105 104 105 104  CO2 30 28 29 29   GLUCOSE 94 196* 144* 131*  BUN 20 25* 32* 32*  CREATININE 1.16 1.02 1.17 1.11  CALCIUM 9.1 8.9 8.7 8.8   Liver Function Tests:  Recent Labs Lab 04/02/14 1215 04/04/14 0552 04/05/14 0621  AST 17 11 12   ALT 9 7 8   ALKPHOS 71 59 62  BILITOT 0.5 0.3 0.4  PROT 7.9 7.2 7.7  ALBUMIN 3.5 3.2* 3.2*   No results for input(s): LIPASE, AMYLASE in the last 168 hours. No results for input(s): AMMONIA in the last 168 hours. CBC:  Recent Labs Lab 04/02/14 1145 04/03/14 0600 04/04/14 0552 04/05/14 0621  WBC 9.8 9.7 14.9* 12.9*  NEUTROABS 6.6  --  12.6* 10.8*  HGB 12.8* 12.3* 11.8* 12.4*  HCT 39.6 37.1* 36.3* 38.2*  MCV 100.5* 97.9 98.6 99.2  PLT 280 303 300 290   Cardiac Enzymes:  Recent Labs Lab 04/02/14 1215  TROPONINI <0.30   BNP: Invalid input(s): POCBNP CBG: No results for input(s): GLUCAP in the last 168 hours.  Recent Results (from the past 240 hour(s))  MRSA PCR Screening     Status: Abnormal   Collection Time: 04/03/14  9:03 AM  Result Value Ref Range Status   MRSA by PCR POSITIVE (A) NEGATIVE Final    Comment:  CRITICAL RESULT CALLED TO, READ BACK BY AND VERIFIED WITH: PARRISH,N AT 10:50AM ON 04/03/14 BY FESTERMAN,C        The GeneXpert MRSA Assay (FDA approved for NASAL specimens only), is one component of a comprehensive MRSA colonization surveillance program. It is not intended to diagnose MRSA infection nor to guide or monitor treatment for MRSA infections.     Studies:              All Imaging reviewed and is as per above notation   Scheduled Meds: . amLODipine  5 mg Oral Daily  . aspirin EC  81 mg Oral Daily  . atorvastatin  20 mg Oral q1800  . azithromycin  500 mg Oral Daily  . Chlorhexidine Gluconate Cloth  6 each Topical Q0600  . citalopram  10 mg Oral Daily  . diazepam  10 mg Oral TID  . enoxaparin (LOVENOX) injection  40 mg Subcutaneous Q24H  . furosemide  20 mg Oral Daily   . gabapentin  800 mg Oral TID  . glimepiride  4 mg Oral Daily  . guaiFENesin  600 mg Oral BID  . HYDROcodone-acetaminophen  1 tablet Oral 3 times per day  . ipratropium-albuterol  3 mL Nebulization Q6H  . metFORMIN  1,000 mg Oral BID WC  . mometasone-formoterol  2 puff Inhalation BID  . mupirocin ointment  1 application Nasal BID  . predniSONE  60 mg Oral Daily   Continuous Infusions:    Assessment/Plan:  1. 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 He was switched from IV Solu-Medrol to prednisone 60 daily 10/31but because he has not been making much improvement, we'll switch back on 11/2 to IV steroids He will need PFTs as an outpatient. Added Dulera 500 twice a day on 10/31-he is slowly improving.   Desatted 88% percent on room air and will need oxygen on discharge. 2. Pulm nodule-slightly diminished pulm function-I have clearly explained to him this is a priority, but would manage and work-up as OP.  There is some concern for malignancy given his chronic prolonged smoking h/o. 3. Still smoker. Needs to quit. Have explained the risks 4. Chronic pain, lower extremity,Abdominal pain-undifferentiated. Unclear etiology-this is been a longer standing complaint and he has been taking pain medications per his report and sensory on 4/30 years. No pathology in abdomen on acute series 10/31. LFTs are normal. Suspect chronic pain  I have discontinued his IV morphine then switched him from Norco to oxycodone 10 mg every 4 hourly.  I had a [place] discussion with himand his son about not increasing this or using any more pain medicines IV. He should continue his gabapentin 800 3 times a day 5. Potential heart failure-no echo in system.  Lasix 40 mg daily-20 mg daily on 11/1 as mild AKI. Pro-BNP 1100, so suggestive also HF.  Will order echocardiogram and  re-assess 6. Diabetes mellitus type 2-continue Amaryl 4 mg daily, metformin 1000 twice a day-bloodsugars have been very stable in the hospital between 94 and 140 and are likely worsened cause of him being on steroids 7. Hypertension continue Norvasc 5 daily. Blood pressure is moderately well controlled 8. Anxiety-continue diazepam 10 mg 3 times a day-consider discussion as an outpatient with PCP about coming off of this. Continue citalopram 10 mg daily 9. Hyperlipidemia continue atorvastatin 20 every afternoon   Code Status: Full Family Communication: d/w  son plan patient at bedside in detail on 11/2 Disposition Plan: Inpatient   Verneita Griffes, MD  [facility] Pager [phone] 04/05/2014, 10:31 AM    LOS: 3 days

## 2014-04-05 NOTE — Care Management Utilization Note (Signed)
UR completed 

## 2014-04-06 DIAGNOSIS — I359 Nonrheumatic aortic valve disorder, unspecified: Secondary | ICD-10-CM

## 2014-04-06 LAB — COMPREHENSIVE METABOLIC PANEL
ALK PHOS: 65 U/L (ref 39–117)
ALT: 12 U/L (ref 0–53)
ANION GAP: 14 (ref 5–15)
AST: 12 U/L (ref 0–37)
Albumin: 3.3 g/dL — ABNORMAL LOW (ref 3.5–5.2)
BUN: 35 mg/dL — AB (ref 6–23)
CO2: 27 mEq/L (ref 19–32)
CREATININE: 1.02 mg/dL (ref 0.50–1.35)
Calcium: 9.2 mg/dL (ref 8.4–10.5)
Chloride: 104 mEq/L (ref 96–112)
GFR calc Af Amer: 88 mL/min — ABNORMAL LOW (ref >90)
GFR calc non Af Amer: 76 mL/min — ABNORMAL LOW (ref >90)
GLUCOSE: 276 mg/dL — AB (ref 70–99)
Potassium: 6.1 mEq/L — ABNORMAL HIGH (ref 3.7–5.3)
Sodium: 145 mEq/L (ref 137–147)
TOTAL PROTEIN: 7.8 g/dL (ref 6.0–8.3)
Total Bilirubin: 0.5 mg/dL (ref 0.3–1.2)

## 2014-04-06 LAB — CBC WITH DIFFERENTIAL/PLATELET
Basophils Absolute: 0 10*3/uL (ref 0.0–0.1)
Basophils Relative: 0 % (ref 0–1)
EOS PCT: 0 % (ref 0–5)
Eosinophils Absolute: 0 10*3/uL (ref 0.0–0.7)
HCT: 40.4 % (ref 39.0–52.0)
HEMOGLOBIN: 13.3 g/dL (ref 13.0–17.0)
LYMPHS ABS: 0.9 10*3/uL (ref 0.7–4.0)
Lymphocytes Relative: 7 % — ABNORMAL LOW (ref 12–46)
MCH: 32.4 pg (ref 26.0–34.0)
MCHC: 32.9 g/dL (ref 30.0–36.0)
MCV: 98.3 fL (ref 78.0–100.0)
MONO ABS: 0.3 10*3/uL (ref 0.1–1.0)
Monocytes Relative: 2 % — ABNORMAL LOW (ref 3–12)
Neutro Abs: 11.6 10*3/uL — ABNORMAL HIGH (ref 1.7–7.7)
Neutrophils Relative %: 91 % — ABNORMAL HIGH (ref 43–77)
Platelets: 292 10*3/uL (ref 150–400)
RBC: 4.11 MIL/uL — AB (ref 4.22–5.81)
RDW: 14 % (ref 11.5–15.5)
WBC: 12.8 10*3/uL — ABNORMAL HIGH (ref 4.0–10.5)

## 2014-04-06 LAB — BASIC METABOLIC PANEL
ANION GAP: 13 (ref 5–15)
BUN: 33 mg/dL — ABNORMAL HIGH (ref 6–23)
CHLORIDE: 102 meq/L (ref 96–112)
CO2: 26 mEq/L (ref 19–32)
Calcium: 8.9 mg/dL (ref 8.4–10.5)
Creatinine, Ser: 0.98 mg/dL (ref 0.50–1.35)
GFR calc non Af Amer: 86 mL/min — ABNORMAL LOW (ref >90)
Glucose, Bld: 243 mg/dL — ABNORMAL HIGH (ref 70–99)
Potassium: 4.9 mEq/L (ref 3.7–5.3)
SODIUM: 141 meq/L (ref 137–147)

## 2014-04-06 MED ORDER — OXYCODONE HCL 5 MG PO TABS
5.0000 mg | ORAL_TABLET | Freq: Once | ORAL | Status: AC
  Administered 2014-04-06: 5 mg via ORAL
  Filled 2014-04-06: qty 1

## 2014-04-06 MED ORDER — AMLODIPINE BESYLATE 5 MG PO TABS
10.0000 mg | ORAL_TABLET | Freq: Every day | ORAL | Status: DC
  Administered 2014-04-07 – 2014-04-09 (×3): 10 mg via ORAL
  Filled 2014-04-06 (×3): qty 2

## 2014-04-06 MED ORDER — HYDROCOD POLST-CHLORPHEN POLST 10-8 MG/5ML PO LQCR
5.0000 mL | Freq: Once | ORAL | Status: AC
  Administered 2014-04-06: 5 mL via ORAL
  Filled 2014-04-06: qty 5

## 2014-04-06 MED ORDER — KETOROLAC TROMETHAMINE 30 MG/ML IJ SOLN
30.0000 mg | Freq: Once | INTRAMUSCULAR | Status: AC
  Administered 2014-04-06: 30 mg via INTRAVENOUS
  Filled 2014-04-06: qty 1

## 2014-04-06 NOTE — Consult Note (Addendum)
WOC consult: Refer to previous progress note.  Removed bilat [place] boots to assess legs.  Previous full thickness stasis ulcers have healed at this time.  No open wounds or drainage, pt states previous edema has resolved.  Applied 4 layer Profore to BLE and pt plans to have [place] change next Tuesday after discharge. Please re-consult if further assistance is needed.  Thank-you,  Julien Girt MSN, [place], [place], [place], [place]

## 2014-04-06 NOTE — Progress Notes (Signed)
Patient transferred from bed to recliner with assistance of 2. Patient incont of urine. Skin care given. Replace condom catheter. Son at bedside.

## 2014-04-06 NOTE — Progress Notes (Signed)
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 difficile, questionable urinary stones, prior right foot ulcer admitted overnight with shortness of breath. Chest x-ray on admission showed atelectasis He states that he started having abdominal pain but 3-4 days ago and had some sick contacts. He developed increasing shortness of breath to the point that he had come to the emergency room . He was about to leave the emergency room and he said 66 percentile and was admitted   He was slow to respond to treatment although his steroids were transitioned temporarily for 1 day to oral steroids. It was noted he had chronic abdominal pain He also was noted to have on x-ray repeated today subsequent to admission a nodule which will need to be further characterized  Past medical history-As per Problem list Chart reviewed as below- Reviewed  Consultants:  None  Procedures:  Chest x-ray  Antibiotics:  Azithromycin 10/30   Subjective   \"feels terrible" Sob obj better Trying to ge tup to urinate Abd pain seems  A little better allowed nursing to look at wounds earlier today.  Would not alow this earlier in admit    Objective    Interim History: None  Telemetry: Sinus tach   Objective: Filed Vitals:   04/05/14 2145 04/06/14 0527 04/06/14 0708 04/06/14 0909  BP: 132/71 160/74  149/72  Pulse: 70 78    Temp: 98 F (36.7 C) 98.5 F (36.9 C)    TempSrc: Oral Oral    Resp: 20 20    Height:      Weight:      SpO2: 95% 94% 97%     Intake/Output Summary (Last 24 hours) at 04/06/14 1246 Last data filed at 04/06/14 0838  Gross per 24 hour  Intake    480 ml  Output   1100 ml  Net   -620 ml    Exam:  General: Alert pleasant oriented no apparent distress Cardiovascular: S1-S2  no murmur rub or gallop Respiratory: wheezes diminished Abdomen: Slightly tender in RU quadrant-decreased from prior Skin no lower extremity edema Neuro grossly intact  Data Reviewed: Basic Metabolic Panel:  Recent Labs Lab 04/03/14 0600 04/04/14 0552 04/05/14 0621 04/06/14 0609 04/06/14 0836  NA 145 145 145 145 141  K 4.4 4.1 4.7 6.1* 4.9  CL 104 105 104 104 102  CO2 28 29 29 27 26   GLUCOSE 196* 144* 131* 276* 243*  BUN 25* 32* 32* 35* 33*  CREATININE 1.02 1.17 1.11 1.02 0.98  CALCIUM 8.9 8.7 8.8 9.2 8.9   Liver Function Tests:  Recent Labs Lab 04/02/14 1215 04/04/14 0552 04/05/14 0621 04/06/14 0609  AST 17 11 12 12   ALT 9 7 8 12   ALKPHOS 71 59 62 65  BILITOT 0.5 0.3 0.4 0.5  PROT 7.9 7.2 7.7 7.8  ALBUMIN 3.5 3.2* 3.2* 3.3*   No results for input(s): LIPASE, AMYLASE in the last 168 hours. No results for input(s): AMMONIA in the last 168 hours. CBC:  Recent Labs Lab 04/02/14 1145 04/03/14 0600 04/04/14 0552 04/05/14 0621 04/06/14 0609  WBC 9.8 9.7 14.9* 12.9* 12.8*  NEUTROABS 6.6  --  12.6* 10.8* 11.6*  HGB 12.8* 12.3* 11.8* 12.4* 13.3  HCT 39.6 37.1* 36.3* 38.2* 40.4  MCV 100.5* 97.9 98.6 99.2 98.3  PLT 280 303 300  290 292   Cardiac Enzymes:  Recent Labs Lab 04/02/14 1215  TROPONINI <0.30   BNP: Invalid input(s): POCBNP CBG: No results for input(s): GLUCAP in the last 168 hours.  Recent Results (from the past 240 hour(s))  MRSA PCR Screening     Status: Abnormal   Collection Time: 04/03/14  9:03 AM  Result Value Ref Range Status   MRSA by PCR POSITIVE (A) NEGATIVE Final    Comment: CRITICAL RESULT CALLED TO, READ BACK BY AND VERIFIED WITH: PARRISH,N AT 10:50AM ON 04/03/14 BY FESTERMAN,C        The GeneXpert MRSA Assay (FDA approved for NASAL specimens only), is one component of a comprehensive MRSA colonization surveillance program. It is not intended to diagnose MRSA infection nor to guide or monitor treatment for MRSA  infections.     Studies:              All Imaging reviewed and is as per above notation   Scheduled Meds: . amLODipine  5 mg Oral Daily  . aspirin EC  81 mg Oral Daily  . atorvastatin  20 mg Oral q1800  . azithromycin  500 mg Oral Daily  . Chlorhexidine Gluconate Cloth  6 each Topical Q0600  . citalopram  10 mg Oral Daily  . diazepam  10 mg Oral TID  . enoxaparin (LOVENOX) injection  40 mg Subcutaneous Q24H  . furosemide  20 mg Oral Daily  . gabapentin  800 mg Oral TID  . glimepiride  4 mg Oral Daily  . guaiFENesin  600 mg Oral BID  . ipratropium-albuterol  3 mL Nebulization TID  . metFORMIN  1,000 mg Oral BID WC  . methylPREDNISolone (SOLU-MEDROL) injection  80 mg Intravenous Q6H  . mometasone-formoterol  2 puff Inhalation BID  . mupirocin ointment  1 application Nasal BID   Continuous Infusions:    Assessment/Plan:  1. 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 He was switched from IV Solu-Medrol to prednisone 60 daily 10/31but because he has not been making much improvement, switched back on 11/2 to IV steroids.  He will need PFTs as an outpatient. Added Dulera 500 twice a day on 10/31-he is slowly improving.   Desatted 88% percent on room air and will need oxygen on discharge. 2. Pulm nodule-slightly diminished pulm function-I have clearly explained to him this is a priority, but would manage and work-up as OP.  There is some concern for malignancy given his chronic prolonged smoking h/o.  3. Still smoker. Needs to quit. Have explained the risks 4. Chronic pain, lower extremity,Abdominal pain-undifferentiated. Unclear etiology-this is been a longer standing complaint and he has been taking pain medications per his report and sensory on 4/30 years. No pathology in abdomen on acute series 10/31. LFTs are normal. Suspect chronic pain  I have  discontinued his IV morphine then switched him from Norco to oxycodone 10 mg every 4 hourly.  I had a Pietrzyk discussion with him and his son about not increasing this or using any more pain medicines IV. He should continue his gabapentin 800 3 times a day 5. Potential heart failure-no echo in system.  Lasix 40 mg daily-20 mg daily on 11/1 as mild AKI. Pro-BNP 1100, so suggestive also HF.  Echo is pending 6. Diabetes mellitus type 2-continue Amaryl 4 mg daily, metformin 1000 twice a day-bloodsugars have been very stable in  the hospital between 240-280 and are likely worsened cause of him being on steroids 7. Hypertension increase Norvasc 5 daily-->10 od as suboptimal blood pressure control 8.  Anxiety-continue diazepam 10 mg 3 times a day-consider discussion as an outpatient with PCP about coming off of this. Continue citalopram 10 mg daily. 9. Hyperlipidemia continue atorvastatin 20 every afternoon   Code Status: Full Family Communication: d/w son plan patient at bedside in detail on 04/06/14 Disposition Plan: Inpatient   Verneita Griffes, MD  [facility] Pager [phone] 04/06/2014, 12:46 PM    LOS: 4 days

## 2014-04-06 NOTE — Plan of Care (Signed)
Problem: ICU Phase Progression Outcomes Goal: Pain controlled with appropriate interventions Outcome: Progressing Medicated with po medication as needed

## 2014-04-06 NOTE — Progress Notes (Signed)
  Echocardiogram 2D Echocardiogram has been performed.  [place], [place] 04/06/2014, 12:32 PM

## 2014-04-06 NOTE — Progress Notes (Signed)
Inpatient Diabetes Program Recommendations  AACE/ADA: New Consensus Statement on Inpatient Glycemic Control (2013)  Target Ranges:  Prepandial:   less than 140 mg/dL      Peak postprandial:   less than 180 mg/dL (1-2 hours)      Critically ill patients:  140 - 180 mg/dL   Results for Eric Scott, Eric Scott (MRN [id number]) as of 04/06/2014 08:44  Ref. Range 04/06/2014 06:09  Glucose Latest Range: 70-99 mg/dL 276 (H)   Diabetes history: DM2 Outpatient Diabetes medications: Amaryl 2 mg QAM, Metformin 1000 mg BID Current orders for Inpatient glycemic control: Amaryl 2 mg QAM, Metformin 1000 mg BID  Inpatient Diabetes Program Recommendations Correction (SSI): Noted fasting lab glucose of 276 mg/dl this morning. While inpatient and ordered steroids, please order CBGs with Novolog correction scale.  Thanks, Barnie Alderman, RN, MSN, CCRN, CDE Diabetes Coordinator Inpatient Diabetes Program [phone] (Team Pager) [phone] (AP office) [phone] [facility] office)

## 2014-04-06 NOTE — Care Management Note (Signed)
    Page 1 of 1   04/09/2014     1:52:57 PM CARE MANAGEMENT NOTE 04/09/2014  Patient:  Eric Scott, Eric Scott   Account Number:  [id number]  Date Initiated:  04/06/2014  Documentation initiated by:  Vladimir Creeks  Subjective/Objective Assessment:   Pt admitted with COPD. He is from [facility] and will return there at D/C. CSW aware and will facilitate D/C     Action/Plan:   pt has [facility] active for wound care and wold like to continue with them at D/C   Anticipated DC Date:  04/09/2014   Anticipated DC Plan:  [place]  In-house referral  Clinical Social Worker      [place]  CM consult      [facility] Choice  Resumption Of Svcs/PTA Provider  HOME HEALTH   Choice offered to / List presented to:  C-1 Patient           Status of service:  Completed, signed off Medicare Important Message given?  YES (If response is "NO", the following Medicare IM given date fields will be blank) Date Medicare IM given:  04/09/2014 Medicare IM given by:  Vladimir Creeks Date Additional Medicare IM given:   Additional Medicare IM given by:    Discharge Disposition:  [place]  Per UR Regulation:  Reviewed for med. necessity/level of care/duration of stay  If discussed at [place] of Stay Meetings, dates discussed:    Comments:  04/09/14 1330 Vladimir Creeks RN/CM Pt going to SNF today- [facility] 04/07/14 1400  Vladimir Creeks RN/CM Pt is requiring 2 person assist at this point and will need to  go to SNF short term. He agrees and CSW is working with pt and son to find a bed for him. 04/06/14 [place] RN/CM

## 2014-04-07 DIAGNOSIS — R911 Solitary pulmonary nodule: Secondary | ICD-10-CM | POA: Diagnosis present

## 2014-04-07 DIAGNOSIS — J9601 Acute respiratory failure with hypoxia: Secondary | ICD-10-CM | POA: Diagnosis present

## 2014-04-07 DIAGNOSIS — G8929 Other chronic pain: Secondary | ICD-10-CM

## 2014-04-07 DIAGNOSIS — I5033 Acute on chronic diastolic (congestive) heart failure: Secondary | ICD-10-CM | POA: Diagnosis present

## 2014-04-07 DIAGNOSIS — I5032 Chronic diastolic (congestive) heart failure: Secondary | ICD-10-CM

## 2014-04-07 LAB — CBC WITH DIFFERENTIAL/PLATELET
BASOS ABS: 0 10*3/uL (ref 0.0–0.1)
Basophils Relative: 0 % (ref 0–1)
Eosinophils Absolute: 0 10*3/uL (ref 0.0–0.7)
Eosinophils Relative: 0 % (ref 0–5)
HCT: 37.4 % — ABNORMAL LOW (ref 39.0–52.0)
HEMOGLOBIN: 12.3 g/dL — AB (ref 13.0–17.0)
Lymphocytes Relative: 8 % — ABNORMAL LOW (ref 12–46)
Lymphs Abs: 0.9 10*3/uL (ref 0.7–4.0)
MCH: 31.7 pg (ref 26.0–34.0)
MCHC: 32.9 g/dL (ref 30.0–36.0)
MCV: 96.4 fL (ref 78.0–100.0)
Monocytes Absolute: 0.5 10*3/uL (ref 0.1–1.0)
Monocytes Relative: 4 % (ref 3–12)
NEUTROS ABS: 10.3 10*3/uL — AB (ref 1.7–7.7)
NEUTROS PCT: 88 % — AB (ref 43–77)
Platelets: 289 10*3/uL (ref 150–400)
RBC: 3.88 MIL/uL — ABNORMAL LOW (ref 4.22–5.81)
RDW: 14.1 % (ref 11.5–15.5)
WBC: 11.7 10*3/uL — ABNORMAL HIGH (ref 4.0–10.5)

## 2014-04-07 LAB — BLOOD GAS, ARTERIAL
Acid-base deficit: 0.7 mmol/L (ref 0.0–2.0)
BICARBONATE: 23.5 meq/L (ref 20.0–24.0)
Drawn by: 234301
FIO2: 21 %
O2 Saturation: 89.3 %
PATIENT TEMPERATURE: 37
TCO2: 21.1 mmol/L (ref 0–100)
pCO2 arterial: 39.6 mmHg (ref 35.0–45.0)
pH, Arterial: 7.392 (ref 7.350–7.450)
pO2, Arterial: 56.1 mmHg — ABNORMAL LOW (ref 80.0–100.0)

## 2014-04-07 LAB — BASIC METABOLIC PANEL
ANION GAP: 11 (ref 5–15)
BUN: 39 mg/dL — ABNORMAL HIGH (ref 6–23)
CALCIUM: 8.7 mg/dL (ref 8.4–10.5)
CHLORIDE: 104 meq/L (ref 96–112)
CO2: 26 mEq/L (ref 19–32)
Creatinine, Ser: 1.03 mg/dL (ref 0.50–1.35)
GFR calc non Af Amer: 75 mL/min — ABNORMAL LOW (ref >90)
GFR, EST AFRICAN AMERICAN: 87 mL/min — AB (ref >90)
Glucose, Bld: 286 mg/dL — ABNORMAL HIGH (ref 70–99)
Potassium: 4.9 mEq/L (ref 3.7–5.3)
Sodium: 141 mEq/L (ref 137–147)

## 2014-04-07 LAB — GLUCOSE, CAPILLARY
GLUCOSE-CAPILLARY: 288 mg/dL — AB (ref 70–99)
GLUCOSE-CAPILLARY: 339 mg/dL — AB (ref 70–99)
Glucose-Capillary: 288 mg/dL — ABNORMAL HIGH (ref 70–99)

## 2014-04-07 MED ORDER — DIAZEPAM 5 MG PO TABS
10.0000 mg | ORAL_TABLET | Freq: Two times a day (BID) | ORAL | Status: DC | PRN
Start: 2014-04-07 — End: 2014-04-09
  Administered 2014-04-08: 10 mg via ORAL
  Filled 2014-04-07 (×2): qty 2

## 2014-04-07 MED ORDER — INSULIN ASPART 100 UNIT/ML ~~LOC~~ SOLN
0.0000 [IU] | Freq: Three times a day (TID) | SUBCUTANEOUS | Status: DC
  Administered 2014-04-07: 11 [IU] via SUBCUTANEOUS
  Administered 2014-04-07: 8 [IU] via SUBCUTANEOUS
  Administered 2014-04-08: 3 [IU] via SUBCUTANEOUS
  Administered 2014-04-08 – 2014-04-09 (×3): 8 [IU] via SUBCUTANEOUS
  Administered 2014-04-09: 5 [IU] via SUBCUTANEOUS

## 2014-04-07 MED ORDER — INSULIN ASPART 100 UNIT/ML ~~LOC~~ SOLN
0.0000 [IU] | Freq: Every day | SUBCUTANEOUS | Status: DC
  Administered 2014-04-07: 3 [IU] via SUBCUTANEOUS
  Administered 2014-04-08: 2 [IU] via SUBCUTANEOUS

## 2014-04-07 MED ORDER — IPRATROPIUM-ALBUTEROL 0.5-2.5 (3) MG/3ML IN SOLN
3.0000 mL | Freq: Four times a day (QID) | RESPIRATORY_TRACT | Status: DC
  Administered 2014-04-07 – 2014-04-09 (×7): 3 mL via RESPIRATORY_TRACT
  Filled 2014-04-07 (×7): qty 3

## 2014-04-07 MED ORDER — METHYLPREDNISOLONE SODIUM SUCC 125 MG IJ SOLR
60.0000 mg | Freq: Four times a day (QID) | INTRAMUSCULAR | Status: DC
  Administered 2014-04-07 – 2014-04-09 (×7): 60 mg via INTRAVENOUS
  Filled 2014-04-07 (×7): qty 2

## 2014-04-07 MED ORDER — OXYCODONE HCL 5 MG PO TABS
10.0000 mg | ORAL_TABLET | Freq: Four times a day (QID) | ORAL | Status: DC | PRN
  Administered 2014-04-08 – 2014-04-09 (×5): 10 mg via ORAL
  Filled 2014-04-07 (×5): qty 2

## 2014-04-07 MED ORDER — DIAZEPAM 5 MG PO TABS: 10.0000 mg | ORAL_TABLET | Freq: Two times a day (BID) | ORAL | Status: DC | PRN

## 2014-04-07 MED ORDER — GUAIFENESIN ER 600 MG PO TB12
1200.0000 mg | ORAL_TABLET | Freq: Two times a day (BID) | ORAL | Status: DC
Start: 2014-04-07 — End: 2014-04-09
  Administered 2014-04-07 – 2014-04-09 (×4): 1200 mg via ORAL
  Filled 2014-04-07 (×4): qty 2

## 2014-04-07 NOTE — Progress Notes (Addendum)
[facility] PROGRESS NOTE  Eric Scott [id number] DOB: Dec 08, 1950 DOA: 04/02/2014 PCP: [name], MD  Assessment/Plan: 1. Acute respiratory failure, possibly related to COPD exacerbation. Continue to wean down oxygen as tolerated. 2. COPD exacerbation. Continue supportive care with Solu-Medrol, antibiotics and bronchodilators. Increased mucinex and add flutter valve. 3. Pulmonary nodule. Will likely need CT of chest for further characterization 4. Diabetes. Continue sliding scale insulin, especially with steroid treatment. 5. Chronic diastolic congestive heart failure. Echocardiogram shows preserved ejection fraction with grade 2 diastolic dysfunction. He is chronically on Lasix 40 mg daily. Appears to be euvolemic at this time. We'll continue to monitor 6. Lethargy. Possibly related to polypharmacy in the setting of benzodiazepine and narcotic use. We'll decrease frequency of diazepam and oxycodone. 7. Essential hypertension. Continue outpatient regimen  Code Status: full code Family Communication: no family present Disposition Plan: discharge to SNF when stable   Consultants:    Procedures:  Echo: - Hyperechoic oval density noted adjacent to the posterior wall of the right atrium measuring 0.8 x 1.5 cm, likely representing muscular ridge near entrance of superior vena cava. Less likely to be thrombus. Consider CT angiography of the chest if deemed clinically indicated  Antibiotics:  Azithromycin 10/30  HPI/Subjective: Patient was noted to be lethargic today. He reports continued cough and shortness of breath  Objective: Filed Vitals:   04/07/14 1348  BP: 110/54  Pulse: 82  Temp: 97.6 F (36.4 C)  Resp: 20    Intake/Output Summary (Last 24 hours) at 04/07/14 1835 Last data filed at 04/07/14 1300  Gross per 24 hour  Intake    480 ml  Output      0 ml  Net    480 ml   Filed Weights   04/02/14 1942 04/04/14 0443  Weight: 130.2 kg (287  lb 0.6 oz) 131.5 kg (289 lb 14.5 oz)    Exam:   General:  Somnolent, but wakes up to voice and answers questions appropriately  Cardiovascular: s1, s2, rrr  Respiratory: bilateral wheezing and rhonchi that mostly clear with forceful coughing  Abdomen: soft, tender in right abdomen, bs+  Musculoskeletal: no edema b/l   Data Reviewed: Basic Metabolic Panel:  Recent Labs Lab 04/04/14 0552 04/05/14 0621 04/06/14 0609 04/06/14 0836 04/07/14 0548  NA 145 145 145 141 141  K 4.1 4.7 6.1* 4.9 4.9  CL 105 104 104 102 104  CO2 29 29 27 26 26   GLUCOSE 144* 131* 276* 243* 286*  BUN 32* 32* 35* 33* 39*  CREATININE 1.17 1.11 1.02 0.98 1.03  CALCIUM 8.7 8.8 9.2 8.9 8.7   Liver Function Tests:  Recent Labs Lab 04/02/14 1215 04/04/14 0552 04/05/14 0621 04/06/14 0609  AST 17 11 12 12   ALT 9 7 8 12   ALKPHOS 71 59 62 65  BILITOT 0.5 0.3 0.4 0.5  PROT 7.9 7.2 7.7 7.8  ALBUMIN 3.5 3.2* 3.2* 3.3*   No results for input(s): LIPASE, AMYLASE in the last 168 hours. No results for input(s): AMMONIA in the last 168 hours. CBC:  Recent Labs Lab 04/02/14 1145 04/03/14 0600 04/04/14 0552 04/05/14 0621 04/06/14 0609 04/07/14 0548  WBC 9.8 9.7 14.9* 12.9* 12.8* 11.7*  NEUTROABS 6.6  --  12.6* 10.8* 11.6* 10.3*  HGB 12.8* 12.3* 11.8* 12.4* 13.3 12.3*  HCT 39.6 37.1* 36.3* 38.2* 40.4 37.4*  MCV 100.5* 97.9 98.6 99.2 98.3 96.4  PLT 280 303 300 290 292 289   Cardiac Enzymes:  Recent Labs Lab 04/02/14 1215  TROPONINI <0.30   BNP (last 3 results)  Recent Labs  04/05/14 0621  PROBNP 1106.0*   CBG:  Recent Labs Lab 04/07/14 1218 04/07/14 1614  GLUCAP 288* 339*    Recent Results (from the past 240 hour(s))  MRSA PCR Screening     Status: Abnormal   Collection Time: 04/03/14  9:03 AM  Result Value Ref Range Status   MRSA by PCR POSITIVE (A) NEGATIVE Final    Comment: CRITICAL RESULT CALLED TO, READ BACK BY AND VERIFIED WITH: PARRISH,N AT 10:50AM ON 04/03/14 BY  FESTERMAN,C        The GeneXpert MRSA Assay (FDA approved for NASAL specimens only), is one component of a comprehensive MRSA colonization surveillance program. It is not intended to diagnose MRSA infection nor to guide or monitor treatment for MRSA infections.     Studies: No results found.  Scheduled Meds: . amLODipine  10 mg Oral Daily  . aspirin EC  81 mg Oral Daily  . atorvastatin  20 mg Oral q1800  . azithromycin  500 mg Oral Daily  . citalopram  10 mg Oral Daily  . enoxaparin (LOVENOX) injection  40 mg Subcutaneous Q24H  . gabapentin  800 mg Oral TID  . glimepiride  4 mg Oral Daily  . guaiFENesin  600 mg Oral BID  . insulin aspart  0-15 Units Subcutaneous TID WC  . insulin aspart  0-5 Units Subcutaneous QHS  . ipratropium-albuterol  3 mL Nebulization TID  . metFORMIN  1,000 mg Oral BID WC  . [START ON 04/08/2014] methylPREDNISolone (SOLU-MEDROL) injection  60 mg Intravenous Q6H  . mometasone-formoterol  2 puff Inhalation BID  . mupirocin ointment  1 application Nasal BID   Continuous Infusions:   Active Problems:   DM type 2 (diabetes mellitus, type 2)   HTN (hypertension)   Chronic pain (back, legs)   COPD with exacerbation   Hypoxia   Acute respiratory failure with hypoxia   Chronic diastolic CHF (congestive heart failure)   Solitary pulmonary nodule    Time spent: 65mins    Eric Scott  [facility] Pager [phone]. If 7PM-7AM, please contact night-coverage at [email or web address], password [facility] 04/07/2014, 6:35 PM  LOS: 5 days

## 2014-04-07 NOTE — Progress Notes (Signed)
Patient appears to be very lethargic and difficult to arouse.  Notified MD.  Received order for blood gas.  Will continue to monitor patient.

## 2014-04-07 NOTE — Progress Notes (Signed)
Physical Therapy Evaluation Patient Details Name: Eric Scott MRN: [id number] DOB: 1951/01/26 Today's Date: 04/07/2014   History of Present Illness   Eric Scott is a 63 y.o. male with a history of diabetes, hypertension, or lipidemia, chronic pain, leg swelling, CVA. He is a resident at [name]. He became dyspneic with wheezing that started 3 days ago and has been gradually becoming worse since that time. He has had a cough with. Sputum production. He denies fevers. His dyspnea is not worse with ambulation.   Clinical Impression  Pt presents with significant deconditioning and dependencies in mobility. Pt is currently a +2 assist to safely transfer to a chair. Pt has bilateral LE strength 2/5 and does demonstrate poor activity tolerance. Pt resides at [name]. Pt reported he was not getting therapy and was only getting in a w/c with help. I feel pt would benefit from skilled PT to focus on strengthening and improving mobility. Pt is motivated and does want to improve quality of life. I recommend d/c to a SNF facility for therapy and will continue to follow the pt while on acute.    Follow Up Recommendations SNF    Equipment Recommendations  None recommended by PT    Recommendations for Other Services OT consult     Precautions / Restrictions Precautions Precautions: Fall;Other (comment) (contact) Restrictions Weight Bearing Restrictions: No      Mobility  Bed Mobility Overal bed mobility:  (NT-up with nursing)                Transfers Overall transfer level: Needs assistance Equipment used: Rolling walker (2 wheeled) Transfers: Sit to/from Stand Sit to Stand: Max assist         General transfer comment: Pt extremely weak and fatigues quickly. 2 trials of transfers. Pt unable to achieve full upright posture. Cues for increasing forward trunk translation to assist with initial rise up to stand.  Ambulation/Gait Ambulation/Gait assistance:   (unable)              Stairs            Wheelchair Mobility    Modified Rankin (Stroke Patients Only)       Balance Overall balance assessment: Needs assistance Sitting-balance support: Feet supported Sitting balance-Leahy Scale: Fair     Standing balance support: Bilateral upper extremity supported Standing balance-Leahy Scale: Poor                               Pertinent Vitals/Pain Pain Assessment: No/denies pain    Home Living Family/patient expects to be discharged to:: Skilled nursing facility ([facility])                      Prior Function Level of Independence: Needs assistance   Gait / Transfers Assistance Needed: Pt reported he has not walked in a [name] time. He needs assistance with all mobility and transfers to w/c.           Hand Dominance        Extremity/Trunk Assessment   Upper Extremity Assessment: Generalized weakness           Lower Extremity Assessment: RLE deficits/detail;LLE deficits/detail RLE Deficits / Details: 2/5 MMT LLE Deficits / Details: 2/5 MMT     Communication   Communication: No difficulties  Cognition Arousal/Alertness: Awake/alert Behavior During Therapy: Flat affect Overall Cognitive Status: Within Functional Limits for tasks assessed  General Comments General comments (skin integrity, edema, etc.): bilateral LE leg wraps for edema    Exercises General Exercises - Lower Extremity Ankle Circles/Pumps: AROM;Strengthening;Both;10 reps;Seated [place] Arc Quad: AROM;Strengthening;Both;10 reps;Seated Other Exercises Other Exercises: chair push-up x 4      Assessment/Plan    PT Assessment Patient needs continued PT services  PT Diagnosis Difficulty walking;Generalized weakness   PT Problem List Decreased strength;Decreased activity tolerance;Decreased balance;Decreased mobility;Decreased knowledge of use of DME;Decreased safety  awareness;Cardiopulmonary status limiting activity;Impaired sensation  PT Treatment Interventions DME instruction;Functional mobility training;Therapeutic activities;Therapeutic exercise;Balance training;Patient/family education;Gait training   PT Goals (Current goals can be found in the Care Plan section) Acute Rehab PT Goals Patient Stated Goal: To walk again PT Goal Formulation: With patient Time For Goal Achievement: 04/21/14 Potential to Achieve Goals: Fair    Frequency Min 2X/week   Barriers to discharge        Co-evaluation               End of Session Equipment Utilized During Treatment: Gait belt Activity Tolerance: Patient limited by fatigue Patient left: in chair;with call bell/phone within reach;with chair alarm set Nurse Communication: Need for lift equipment         Time: 0955-1020 PT Time Calculation (min): 25 min   Charges:   PT Evaluation $Initial PT Evaluation Tier I: 1 Procedure PT Treatments $Therapeutic Activity: 8-22 mins   PT G Codes:          Eric Scott 04/07/2014, 10:36 AM

## 2014-04-07 NOTE — Clinical Social Work Note (Signed)
CSW met with patient and advised that SNF had been recommended for rehab.  Patient replied "I don't think I can do anything." CSW encouraged patient to attempt rehab to determine his actual abilities and build his strength.  Patient agreed that he would try rehab.  Patient began to review the SNF list provided.  He did not currently identify his preference.  CSW advised that [facility] would have to authorize the SNF placement.  CSW spoke with patient's son Eric Scott. CSW advised patient's son that SNF had been recommended for rehab.  CSW obtained permission from patient's son to notify [facility] of the recommendation as well as the placement facility once it is determined. Patient's son indicated he wanted [facility] to save patient's belongings.  Patient's son indicated that he wanted patient placed in a facility close to [place]. He indicated that CSW could seek placement in [place], [place], [place] and other surrounding areas. Patient's son was agreeable to patient going to rehab.   Eric Scott, [place]

## 2014-04-07 NOTE — Clinical Social Work Placement (Signed)
Clinical Social Work Department CLINICAL SOCIAL WORK PLACEMENT NOTE 04/07/2014  Patient:  Eric Scott, Eric Scott  Account Number:  [id number] Admit date:  04/02/2014  Clinical Social Worker:  Ambrose Pancoast, LCSW  Date/time:  04/07/2014 12:18 PM  Clinical Social Work is seeking post-discharge placement for this patient at the following level of care:   [place]   (*CSW will update this form in Epic as items are completed)   04/07/2014  Patient/family provided with [place] Department of Clinical Social Work's list of facilities offering this level of care within the geographic area requested by the patient (or if unable, by the patient's family).  04/07/2014  Patient/family informed of their freedom to choose among providers that offer the needed level of care, that participate in Medicare, Medicaid or managed care program needed by the patient, have an available bed and are willing to accept the patient.  04/07/2014  Patient/family informed of [facility]' ownership interest in [facility], as well as of the fact that they are under no obligation to receive care at this facility.  PASARR submitted to EDS on 04/07/2014 PASARR number received on 04/07/2014  FL2 transmitted to all facilities in geographic area requested by pt/family on  04/07/2014 FL2 transmitted to all facilities within larger geographic area on   Patient informed that his/her managed care company has contracts with or will negotiate with  certain facilities, including the following:   Patient advised that [place] would have to authorize placement     Patient/family informed of bed offers received:   Patient chooses bed at  Physician recommends and patient chooses bed at    Patient to be transferred to  on   Patient to be transferred to facility by  Patient and family notified of transfer on  Name of family member notified:    The following physician request were entered in Epic:   Additional  Comments:    Ambrose Pancoast, [place]

## 2014-04-07 NOTE — Progress Notes (Signed)
Inpatient Diabetes Program Recommendations  AACE/ADA: New Consensus Statement on Inpatient Glycemic Control (2013)  Target Ranges:  Prepandial:   less than 140 mg/dL      Peak postprandial:   less than 180 mg/dL (1-2 hours)      Critically ill patients:  140 - 180 mg/dL   Results for Eric Scott, Eric Scott (MRN [id number]) as of 04/07/2014 11:48  Ref. Range 04/06/2014 06:09 04/06/2014 08:36 04/07/2014 05:48  Glucose Latest Range: 70-99 mg/dL 276 (H) 243 (H) 286 (H)    Diabetes history: DM2 Outpatient Diabetes medications: Amaryl 2 mg QAM, Metformin 1000 mg BID Current orders for Inpatient glycemic control: Amaryl 2 mg QAM, Metformin 1000 mg BID  Inpatient Diabetes Program Recommendations Correction (SSI): Blood glucose is NOT being routinely monitored as the only glucose values are from lab draws. Fasting lab glucose was 286 mg/dl this morning. While inpatient and ordered steroids, please order CBGs with Novolog correction scale.  Thanks, Barnie Alderman, RN, MSN, CCRN, CDE Diabetes Coordinator Inpatient Diabetes Program [phone] (Team Pager) [phone] ([facility] office) [phone] [facility] office)

## 2014-04-07 NOTE — Plan of Care (Signed)
Problem: ICU Phase Progression Outcomes Goal: Pain controlled with appropriate interventions Outcome: Progressing     

## 2014-04-07 NOTE — Plan of Care (Signed)
Problem: Phase II Progression Outcomes Goal: O2 sats > equal to 90% on RA or at baseline Outcome: Progressing  Comments:  Pt. 90% on RA while sitting in chair.

## 2014-04-08 ENCOUNTER — Inpatient Hospital Stay (HOSPITAL_COMMUNITY): Payer: Medicare Other

## 2014-04-08 LAB — GLUCOSE, CAPILLARY
Glucose-Capillary: 255 mg/dL — ABNORMAL HIGH (ref 70–99)
Glucose-Capillary: 255 mg/dL — ABNORMAL HIGH (ref 70–99)
Glucose-Capillary: 191 mg/dL — ABNORMAL HIGH (ref 70–99)
Glucose-Capillary: 207 mg/dL — ABNORMAL HIGH (ref 70–99)

## 2014-04-08 LAB — BASIC METABOLIC PANEL
ANION GAP: 13 (ref 5–15)
BUN: 37 mg/dL — ABNORMAL HIGH (ref 6–23)
CALCIUM: 8.8 mg/dL (ref 8.4–10.5)
CO2: 25 mEq/L (ref 19–32)
CREATININE: 0.93 mg/dL (ref 0.50–1.35)
Chloride: 104 mEq/L (ref 96–112)
GFR, EST NON AFRICAN AMERICAN: 88 mL/min — AB (ref >90)
Glucose, Bld: 245 mg/dL — ABNORMAL HIGH (ref 70–99)
Potassium: 4.7 mEq/L (ref 3.7–5.3)
Sodium: 142 mEq/L (ref 137–147)

## 2014-04-08 LAB — CBC
HCT: 38.8 % — ABNORMAL LOW (ref 39.0–52.0)
Hemoglobin: 13.1 g/dL (ref 13.0–17.0)
MCH: 32.4 pg (ref 26.0–34.0)
MCHC: 33.8 g/dL (ref 30.0–36.0)
MCV: 96 fL (ref 78.0–100.0)
PLATELETS: 311 10*3/uL (ref 150–400)
RBC: 4.04 MIL/uL — ABNORMAL LOW (ref 4.22–5.81)
RDW: 14 % (ref 11.5–15.5)
WBC: 14.8 10*3/uL — AB (ref 4.0–10.5)

## 2014-04-08 MED ORDER — FUROSEMIDE 40 MG PO TABS
40.0000 mg | ORAL_TABLET | Freq: Every day | ORAL | Status: DC
  Administered 2014-04-09: 40 mg via ORAL
  Filled 2014-04-08: qty 1

## 2014-04-08 MED ORDER — MILK AND MOLASSES ENEMA
1.0000 | Freq: Once | RECTAL | Status: AC
  Administered 2014-04-08: 250 mL via RECTAL

## 2014-04-08 MED ORDER — OXYCODONE HCL 5 MG PO TABS
5.0000 mg | ORAL_TABLET | Freq: Once | ORAL | Status: AC
  Administered 2014-04-08: 5 mg via ORAL
  Filled 2014-04-08: qty 1

## 2014-04-08 NOTE — Progress Notes (Signed)
[facility] PROGRESS NOTE  Eric Scott Un [name] DOB: [birth year] DOA: 04/02/2014 PCP: [name], MD  Assessment/Plan: 1. Acute respiratory failure, possibly related to COPD exacerbation. Continue to wean down oxygen as tolerated. 2. COPD exacerbation. Continue supportive care with Solu-Medrol, antibiotics and bronchodilators. 3. Pulmonary nodule. Will likely need CT of chest for further characterization 4. Diabetes. Continue sliding scale insulin, especially with steroid treatment. 5. Chronic diastolic congestive heart failure. Echocardiogram shows preserved ejection fraction with grade 2 diastolic dysfunction. He is chronically on Lasix 40 mg daily. Appears to be euvolemic at this time. We'll continue to monitor 6. Lethargy. Possibly related to polypharmacy in the setting of benzodiazepine and narcotic use. Benzos and narcotics were decreased. Improving. 7. Essential hypertension. Continue outpatient regimen 8. Abdominal pain, possibly related to constipation. Will give enema. Check abd xray and stool for C diff.  Code Status: full code Family Communication: no family present Disposition Plan: discharge to SNF when stable   Consultants:    Procedures:  Echo: - Hyperechoic oval density noted adjacent to the posterior wall of the right atrium measuring 0.8 x 1.5 cm, likely representing muscular ridge near entrance of superior vena cava. Less likely to be thrombus. Consider CT angiography of the chest if deemed clinically indicated  Antibiotics:  Azithromycin 10/30  HPI/Subjective: Reports pain in his left abdomen, also worsening pain in right abdomen that is worse with coughing. Had a bowel movement yesterday that was hard stool, no vomiting.  Objective: Filed Vitals:   04/08/14 1435  BP: 135/65  Pulse: 85  Temp: 98 F (36.7 C)  Resp: 20    Intake/Output Summary (Last 24 hours) at 04/08/14 1610 Last data filed at 04/08/14 1245  Gross per 24  hour  Intake    580 ml  Output    400 ml  Net    180 ml   Filed Weights   04/02/14 1942 04/04/14 0443  Weight: 130.2 kg (287 lb 0.6 oz) 131.5 kg (289 lb 14.5 oz)    Exam:   General:  Sitting up in chair, does not appear to be in distress  Cardiovascular: s1, s2, rrr  Respiratory: diminished breath sounds but otherwise clear  Abdomen: soft, tender in right abdomen, bs+  Musculoskeletal: lower extremities are wrapped in ace bandages for compression  Data Reviewed: Basic Metabolic Panel:  Recent Labs Lab 04/05/14 0621 04/06/14 0609 04/06/14 0836 04/07/14 0548 04/08/14 0902  NA 145 145 141 141 142  K 4.7 6.1* 4.9 4.9 4.7  CL 104 104 102 104 104  CO2 29 27 26 26 25   GLUCOSE 131* 276* 243* 286* 245*  BUN 32* 35* 33* 39* 37*  CREATININE 1.11 1.02 0.98 1.03 0.93  CALCIUM 8.8 9.2 8.9 8.7 8.8   Liver Function Tests:  Recent Labs Lab 04/02/14 1215 04/04/14 0552 04/05/14 0621 04/06/14 0609  AST 17 11 12 12   ALT 9 7 8 12   ALKPHOS 71 59 62 65  BILITOT 0.5 0.3 0.4 0.5  PROT 7.9 7.2 7.7 7.8  ALBUMIN 3.5 3.2* 3.2* 3.3*   No results for input(s): LIPASE, AMYLASE in the last 168 hours. No results for input(s): AMMONIA in the last 168 hours. CBC:  Recent Labs Lab 04/02/14 1145  04/04/14 0552 04/05/14 0621 04/06/14 0609 04/07/14 0548 04/08/14 0902  WBC 9.8  < > 14.9* 12.9* 12.8* 11.7* 14.8*  NEUTROABS 6.6  --  12.6* 10.8* 11.6* 10.3*  --   HGB 12.8*  < > 11.8* 12.4* 13.3 12.3* 13.1  HCT 39.6  < > 36.3* 38.2* 40.4 37.4* 38.8*  MCV 100.5*  < > 98.6 99.2 98.3 96.4 96.0  PLT 280  < > 300 290 292 289 311  < > = values in this interval not displayed. Cardiac Enzymes:  Recent Labs Lab 04/02/14 1215  TROPONINI <0.30   BNP (last 3 results)  Recent Labs  04/05/14 0621  PROBNP 1106.0*   CBG:  Recent Labs Lab 04/07/14 1218 04/07/14 1614 04/07/14 2214 04/08/14 0742 04/08/14 1129  GLUCAP 288* 339* 288* 255* 255*    Recent Results (from the past 240  hour(s))  MRSA PCR Screening     Status: Abnormal   Collection Time: 04/03/14  9:03 AM  Result Value Ref Range Status   MRSA by PCR POSITIVE (A) NEGATIVE Final    Comment: CRITICAL RESULT CALLED TO, READ BACK BY AND VERIFIED WITH: PARRISH,N AT 10:50AM ON 04/03/14 BY FESTERMAN,C        The GeneXpert MRSA Assay (FDA approved for NASAL specimens only), is one component of a comprehensive MRSA colonization surveillance program. It is not intended to diagnose MRSA infection nor to guide or monitor treatment for MRSA infections.     Studies: No results found.  Scheduled Meds: . amLODipine  10 mg Oral Daily  . aspirin EC  81 mg Oral Daily  . atorvastatin  20 mg Oral q1800  . azithromycin  500 mg Oral Daily  . citalopram  10 mg Oral Daily  . enoxaparin (LOVENOX) injection  40 mg Subcutaneous Q24H  . [START ON 04/09/2014] furosemide  40 mg Oral Daily  . gabapentin  800 mg Oral TID  . glimepiride  4 mg Oral Daily  . guaiFENesin  1,200 mg Oral BID  . insulin aspart  0-15 Units Subcutaneous TID WC  . insulin aspart  0-5 Units Subcutaneous QHS  . ipratropium-albuterol  3 mL Nebulization Q6H  . metFORMIN  1,000 mg Oral BID WC  . methylPREDNISolone (SOLU-MEDROL) injection  60 mg Intravenous Q6H  . milk and molasses  1 enema Rectal Once  . mometasone-formoterol  2 puff Inhalation BID   Continuous Infusions:   Active Problems:   DM type 2 (diabetes mellitus, type 2)   HTN (hypertension)   Chronic pain (back, legs)   COPD with exacerbation   Hypoxia   Acute respiratory failure with hypoxia   Chronic diastolic CHF (congestive heart failure)   Solitary pulmonary nodule    Time spent: 62mins    Eric Scott  [facility] Pager [phone]. If 7PM-7AM, please contact night-coverage at [email or web address], password [facility] 04/08/2014, 4:10 PM  LOS: 6 days

## 2014-04-08 NOTE — Clinical Social Work Note (Signed)
CSW presented bed offers to pt and pt's son. They choose [facility]. Facility notified. [facility] also made aware with pt's permission as he is hopeful to return there after SNF. Awaiting stability for d/c.  Benay Pike, [place]

## 2014-04-08 NOTE — Progress Notes (Signed)
Patient complains of right upper quadrant pain. Oxycodone IR 10mg  given at 6am. Patient has no relief. Dr. Roderic Palau notified. New order for oxycodone 5mg , one time dose.

## 2014-04-08 NOTE — Progress Notes (Deleted)
PT Cancellation Note  Patient Details Name: Eric Scott MRN: [id number] DOB: 11/14/1950   Cancelled Treatment:    Reason Eval/Treat Not Completed: Patient declined, no reason specified Attempted PT treatment, however pt declined stating he is SOB.  Pt reports he received a breathing treatment this morning, and just received morphine.  Educated pt on importance on therapeutic exercises and OOB activities to increase strength and activity tolerance, however pt continued to decline.  Pt was seated at EOB when PT entered room, and reports he has been able to transfer in/out of bed without assist.  Will re-attempt PT treatment as time allows.     Shyana Kulakowski 04/08/2014, 9:49 AM

## 2014-04-08 NOTE — Progress Notes (Signed)
Inpatient Diabetes Program Recommendations  AACE/ADA: New Consensus Statement on Inpatient Glycemic Control (2013)  Target Ranges:  Prepandial:   less than 140 mg/dL      Peak postprandial:   less than 180 mg/dL (1-2 hours)      Critically ill patients:  140 - 180 mg/dL   Reason for Assessment:  Results for Eric Scott, Eric Scott (MRN [id number]) as of 04/08/2014 14:50  Ref. Range 04/07/2014 12:18 04/07/2014 16:14 04/07/2014 22:14 04/08/2014 07:42 04/08/2014 11:29  Glucose-Capillary Latest Range: 70-99 mg/dL 288 (H) 339 (H) 288 (H) 255 (H) 255 (H)   Diabetes history: Type 2 diabetes Outpatient Diabetes medications: Amaryl 4 mg daily, Metformin 1000 mg bid Current orders for Inpatient glycemic control:  Novolog moderate tid with meals and HS  Consider adding basal insulin such as Levemir 15 units daily due to continued elevation of CBG's.  Also please check A1C to determine preh-ospitalization glycemic control.  Adah Perl, RN, BC-ADM Inpatient Diabetes Coordinator Pager [phone]

## 2014-04-08 NOTE — Clinical Social Work Placement (Signed)
Clinical Social Work Department CLINICAL SOCIAL WORK PLACEMENT NOTE 04/08/2014  Patient:  Eric Scott, Eric Scott  Account Number:  [id number] Admit date:  04/02/2014  Clinical Social Worker:  Ambrose Pancoast, LCSW  Date/time:  04/07/2014 12:18 PM  Clinical Social Work is seeking post-discharge placement for this patient at the following level of care:   SKILLED NURSING   (*CSW will update this form in Epic as items are completed)   04/07/2014  Patient/family provided with [place] Department of Clinical Social Work's list of facilities offering this level of care within the geographic area requested by the patient (or if unable, by the patient's family).  04/07/2014  Patient/family informed of their freedom to choose among providers that offer the needed level of care, that participate in Medicare, Medicaid or managed care program needed by the patient, have an available bed and are willing to accept the patient.  04/07/2014  Patient/family informed of [facility]' ownership interest in [facility], as well as of the fact that they are under no obligation to receive care at this facility.  PASARR submitted to EDS on 04/07/2014 PASARR number received on 04/07/2014  FL2 transmitted to all facilities in geographic area requested by pt/family on  04/07/2014 FL2 transmitted to all facilities within larger geographic area on   Patient informed that his/her managed care company has contracts with or will negotiate with  certain facilities, including the following:     Patient/family informed of bed offers received:  04/08/2014 Patient chooses bed at [facility] Physician recommends and patient chooses bed at  [facility]  Patient to be transferred to  on   Patient to be transferred to facility by  Patient and family notified of transfer on  Name of family member notified:    The following physician request were entered in Epic:   Additional Comments:  Benay Pike, [place]

## 2014-04-09 ENCOUNTER — Inpatient Hospital Stay
Admission: RE | Admit: 2014-04-09 | Discharge: 2014-05-20 | Disposition: A | Discharge status: Nursing Home (Includes ICF) | Payer: Medicare Other | Source: Ambulatory Visit | Attending: Internal Medicine | Admitting: Internal Medicine

## 2014-04-09 DIAGNOSIS — R609 Edema, unspecified: Principal | ICD-10-CM

## 2014-04-09 LAB — GLUCOSE, CAPILLARY
GLUCOSE-CAPILLARY: 214 mg/dL — AB (ref 70–99)
GLUCOSE-CAPILLARY: 263 mg/dL — AB (ref 70–99)

## 2014-04-09 LAB — BASIC METABOLIC PANEL
Anion gap: 12 (ref 5–15)
BUN: 40 mg/dL — AB (ref 6–23)
CHLORIDE: 103 meq/L (ref 96–112)
CO2: 28 meq/L (ref 19–32)
Calcium: 9 mg/dL (ref 8.4–10.5)
Creatinine, Ser: 1.08 mg/dL (ref 0.50–1.35)
GFR calc Af Amer: 82 mL/min — ABNORMAL LOW (ref >90)
GFR calc non Af Amer: 71 mL/min — ABNORMAL LOW (ref >90)
Glucose, Bld: 206 mg/dL — ABNORMAL HIGH (ref 70–99)
Potassium: 4.9 mEq/L (ref 3.7–5.3)
Sodium: 143 mEq/L (ref 137–147)

## 2014-04-09 MED ORDER — OXYCODONE HCL 10 MG PO TABS: 10.0000 mg | ORAL_TABLET | Freq: Four times a day (QID) | ORAL | Status: DC | PRN

## 2014-04-09 MED ORDER — MOMETASONE FURO-FORMOTEROL FUM 200-5 MCG/ACT IN AERO: 2.0000 | INHALATION_SPRAY | Freq: Two times a day (BID) | RESPIRATORY_TRACT | Status: DC

## 2014-04-09 MED ORDER — POLYETHYLENE GLYCOL 3350 17 G PO PACK: 17.0000 g | PACK | Freq: Every day | ORAL | Status: DC

## 2014-04-09 MED ORDER — IPRATROPIUM-ALBUTEROL 0.5-2.5 (3) MG/3ML IN SOLN: 3.0000 mL | Freq: Four times a day (QID) | RESPIRATORY_TRACT | Status: DC

## 2014-04-09 MED ORDER — DIAZEPAM 10 MG PO TABS: 10.0000 mg | ORAL_TABLET | Freq: Three times a day (TID) | ORAL | Status: DC | PRN

## 2014-04-09 MED ORDER — PREDNISONE 10 MG PO TABS: ORAL_TABLET | ORAL | Status: DC

## 2014-04-09 MED ORDER — AMLODIPINE BESYLATE 5 MG PO TABS: 10.0000 mg | ORAL_TABLET | Freq: Every day | ORAL | Status: DC

## 2014-04-09 NOTE — Clinical Social Work Placement (Signed)
Clinical Social Work Department CLINICAL SOCIAL WORK PLACEMENT NOTE 04/09/2014  Patient:  MONTANNA, HAMROCK  Account Number:  [id number] Admit date:  04/02/2014  Clinical Social Worker:  Ambrose Pancoast, LCSW  Date/time:  04/07/2014 12:18 PM  Clinical Social Work is seeking post-discharge placement for this patient at the following level of care:   SKILLED NURSING   (*CSW will update this form in Epic as items are completed)   04/07/2014  Patient/family provided with [place] Department of Clinical Social Work's list of facilities offering this level of care within the geographic area requested by the patient (or if unable, by the patient's family).  04/07/2014  Patient/family informed of their freedom to choose among providers that offer the needed level of care, that participate in Medicare, Medicaid or managed care program needed by the patient, have an available bed and are willing to accept the patient.  04/07/2014  Patient/family informed of [facility]' ownership interest in [facility], as well as of the fact that they are under no obligation to receive care at this facility.  PASARR submitted to EDS on 04/07/2014 PASARR number received on 04/07/2014  FL2 transmitted to all facilities in geographic area requested by pt/family on  04/07/2014 FL2 transmitted to all facilities within larger geographic area on   Patient informed that his/her managed care company has contracts with or will negotiate with  certain facilities, including the following:     Patient/family informed of bed offers received:  04/08/2014 Patient chooses bed at [facility] Physician recommends and patient chooses bed at  [facility]  Patient to be transferred to [facility] on  04/09/2014 Patient to be transferred to facility by RN Patient and family notified of transfer on 04/09/2014 Name of family member notified:  attempted to reach son, unable. Left vm with  niece,Lisa  The following physician request were entered in Epic:   Additional Comments:  Benay Pike, [place]

## 2014-04-09 NOTE — Progress Notes (Signed)
Inpatient Diabetes Program Recommendations  AACE/ADA: New Consensus Statement on Inpatient Glycemic Control (2013)  Target Ranges:  Prepandial:   less than 140 mg/dL      Peak postprandial:   less than 180 mg/dL (1-2 hours)      Critically ill patients:  140 - 180 mg/dL    Inpatient Diabetes Program Recommendations Insulin - Basal: While on high dose steroid, please add basal insulin, 15 units lantus/levemir to start. Correction (SSI): Noted fasting lab glucose of 276 mg/dl this morning. While inpatient and ordered steroids, please order CBGs with Novolog correction scale.  Thank you, Rosita Kea, RN, CNS, Diabetes Coordinator [phone])

## 2014-04-09 NOTE — Plan of Care (Signed)
Problem: Phase II Progression Outcomes Goal: O2 sats > equal to 90% on RA or at baseline Outcome: Completed/Met Date Met:  04/09/14     

## 2014-04-09 NOTE — Progress Notes (Signed)
Physical Therapy Treatment Patient Details Name: Eric Scott MRN: [id number] DOB: Sep 08, 1950 Today's Date: 04/09/2014    History of Present Illness  Eric Scott is a 63 y.o. male with a history of diabetes, hypertension, or lipidemia, chronic pain, leg swelling, CVA. He is a resident at [facility]. He became dyspneic with wheezing that started 3 days ago and has been gradually becoming worse since that time. He has had a cough with. Sputum production. He denies fevers. His dyspnea is not worse with ambulation.     PT Comments    Pt is slowly progressing with mobility and activity tolerance. Pt is significantly weak and requires +2 assist with out of bed to chair. Pt was able to tolerate standing with a RW for 20 seconds with mod/max support. Pt fatigues very quickly. Reinforced HEP with pt and continue to recommend skilled PT for mobility and strengthening.    Follow Up Recommendations  SNF     Equipment Recommendations  None recommended by PT    Recommendations for Other Services       Precautions / Restrictions Precautions Precautions: Fall;Other (comment) (contact) Restrictions Weight Bearing Restrictions: No    Mobility  Bed Mobility               Scott bed mobility comments: nrusing has been assisting with OOB. Per pt +2 assist  Transfers Overall transfer level: Needs assistance Equipment used: Rolling walker (2 wheeled) Transfers: Sit to/from Stand Sit to Stand: Mod assist         Scott transfer comment: Pt extremely weak and fatigues quickly. 2 trials of transfers. Pt unable to achieve full upright posture. Cues for increasing forward trunk translation to assist with initial rise up to stand. Pt able to stand for 15 secs and then fatigued.  Ambulation/Gait                 Stairs            Wheelchair Mobility    Modified Rankin (Stroke Patients Only)       Balance           Standing balance support:  Bilateral upper extremity supported Standing balance-Leahy Scale: Poor                      Cognition Arousal/Alertness: Awake/alert Behavior During Therapy: Flat affect Overall Cognitive Status: Within Functional Limits for tasks assessed                      Exercises Scott Exercises - Lower Extremity Ankle Circles/Pumps: AROM;Both;10 reps;Supine [place] Arc Quad: AROM;Both;10 reps;Seated Heel Slides: AAROM;Strengthening;Both;10 reps;Supine Straight Leg Raises: AAROM;Strengthening;Both;10 reps;Supine Hip Flexion/Marching: AROM;Strengthening;Both;10 reps;Seated Other Exercises Other Exercises: chair push-up x 4    Scott Comments        Pertinent Vitals/Pain Pain Assessment: No/denies pain    Home Living Family/patient expects to be discharged to:: Assisted living ([facility])                    Prior Function            PT Goals (current goals can now be found in the care plan section) Progress towards PT goals: Progressing toward goals    Frequency  Min 2X/week    PT Plan Current plan remains appropriate    Co-evaluation             End of Session Equipment Utilized During Treatment: Gait  belt Activity Tolerance: Patient limited by fatigue Patient left: in chair;with call bell/phone within reach;with chair alarm set     Time: 1200-1229 PT Time Calculation (min): 29 min  Charges:  $Therapeutic Exercise: 8-22 mins $Therapeutic Activity: 8-22 mins                    G Codes:      Eric Scott 04/09/2014, 12:37 PM

## 2014-04-09 NOTE — Clinical Social Work Note (Signed)
Pt d/c today to [facility]. Pt and facility aware and agreeable. CSW left voicemail for pt's niece, Lattie Haw. Attempted to reach son, but no voicemail option. D/C summary faxed. Pt to transfer with RN.  Benay Pike, [place]

## 2014-04-09 NOTE — Progress Notes (Signed)
Report called to [facility]. 

## 2014-04-09 NOTE — Discharge Summary (Signed)
Physician Discharge Summary  Eric Scott [name] DOB: 03-27-1951 DOA: 04/02/2014  PCP: [name], MD  Admit date: 04/02/2014 Discharge date: 04/09/2014  Time spent: 40 minutes  Recommendations for Outpatient Follow-up:  1. Patient will need outpatient CT of the chest with contrast to evaluate possible pulmonary nodule  Discharge Diagnoses:  Active Problems:   DM type 2 (diabetes mellitus, type 2)   HTN (hypertension)   Chronic pain (back, legs)   COPD with exacerbation   Hypoxia   Acute respiratory failure with hypoxia   Chronic diastolic CHF (congestive heart failure)   Solitary pulmonary nodule   Discharge Condition: improved  Diet recommendation: low salt, low carb  Filed Weights   04/02/14 1942 04/04/14 0443  Weight: 130.2 kg (287 lb 0.6 oz) 131.5 kg (289 lb 14.5 oz)    History of present illness:  This is a 63 year old patient who presented to the emergency room with shortness of breath and wheezing that began approximately 3 days prior to admission. He was noted to have a possible COPD exacerbation and was admitted for further treatments.  Hospital Course:  Patient required supplemental oxygen during his hospital stay. He was treated with intravenous steroids, antibiotics and bronchodilators for COPD exacerbation and has significantly improved. He currently only has very mild wheezing and has been maintaining oxygen saturations greater than 90% on room air. He is placed on a prednisone taper. He was also started on [facility]. He will need to continue on bronchodilators.  Patient was also noticed to have some evidence of volume overload. Echocardiogram was checked and found to have a preserved ejection fraction. It was felt that he may have acute on chronic diastolic congestive heart failure and initially received intravenous Lasix. This has been transitioned back to his home dose of 40 mg by mouth daily. He appears to be euvolemic at this time.  Imaging of his  chest including chest x-ray which indicated possible pulmonary nodule. He will need further imaging with CT scan of the chest with IV contrast. This can be further pursued in the outpatient setting.  Patient did complain of right upper quadrant and left upper quadrant abdominal pain. It was felt that his pain was likely related to constipation. He was given a enema which did improve his symptoms. Patient is on chronic narcotics and is at risk of developing opiate-induced constipation. Will keep on daily Miralax. His abdominal pain is currently improving  He did have an episode of lethargy during his hospital stay. His benzodiazepine doses were reduced as were his narcotics. This will be followed in the outpatient setting.  Patient was seen by physical therapy and recommended skilled nursing facility placement. He will be transferred there later today.  Procedures:  Echo: Hyperechoic oval density noted adjacent to the posterior wall of the right atrium measuring 0.8 x 1.5 cm, likely representing muscular ridge near entrance of superior vena cava. Less likely to be thrombus. Consider CT angiography of the chest if deemed clinically indicated  Consultations:    Discharge Exam: Filed Vitals:   04/09/14 1416  BP: 131/72  Pulse: 67  Temp: 97.9 F (36.6 C)  Resp: 20    General: NAD Cardiovascular: s1, s2, rrr Respiratory: mild expiratory wheezing  Discharge Instructions You were cared for by a hospitalist during your hospital stay. If you have any questions about your discharge medications or the care you received while you were in the hospital after you are discharged, you can call the unit and asked to speak with  the hospitalist on call if the hospitalist that took care of you is not available. Once you are discharged, your primary care physician will handle any further medical issues. Please note that NO REFILLS for any discharge medications will be authorized once you are  discharged, as it is imperative that you return to your primary care physician (or establish a relationship with a primary care physician if you do not have one) for your aftercare needs so that they can reassess your need for medications and monitor your lab values.  Discharge Instructions    Diet - low sodium heart healthy    Complete by:  As directed      Diet Carb Modified    Complete by:  As directed      Increase activity slowly    Complete by:  As directed           Current Discharge Medication List    START taking these medications   Details  azithromycin (ZITHROMAX) 250 MG tablet 1 tablet daily starting tomorrow afternoon for 4 days Qty: 4 each, Refills: 0    ipratropium-albuterol (DUONEB) 0.5-2.5 (3) MG/3ML SOLN Take 3 mLs by nebulization every 6 (six) hours. Qty: 360 mL    mometasone-formoterol (DULERA) 200-5 MCG/ACT AERO Inhale 2 puffs into the lungs 2 (two) times daily.    oxyCODONE 10 MG TABS Take 1 tablet (10 mg total) by mouth every 6 (six) hours as needed for severe pain. Qty: 30 tablet, Refills: 0    polyethylene glycol (MIRALAX) packet Take 17 g by mouth daily. Qty: 14 each, Refills: 0    predniSONE (DELTASONE) 10 MG tablet Take 40 mg po daily for 2 days then 30mg  po daily for 2 days then 20mg  po daily for 2 days then 10mg  po daily for 2 days then stop      CONTINUE these medications which have CHANGED   Details  amLODipine (NORVASC) 5 MG tablet Take 2 tablets (10 mg total) by mouth daily.    diazepam (VALIUM) 10 MG tablet Take 1 tablet (10 mg total) by mouth every 8 (eight) hours as needed for anxiety. Qty: 30 tablet, Refills: 0      CONTINUE these medications which have NOT CHANGED   Details  aspirin EC 81 MG tablet Take 81 mg by mouth daily.    atorvastatin (LIPITOR) 20 MG tablet Take 20 mg by mouth daily.     calcium-vitamin D (OSCAL WITH D) 500-200 MG-UNIT per tablet Take 1 tablet by mouth 3 (three) times daily.    citalopram (CELEXA) 10 MG  tablet Take 10 mg by mouth daily.    fish oil-omega-3 fatty acids 1000 MG capsule Take 1 g by mouth 2 (two) times daily.    furosemide (LASIX) 40 MG tablet Take 40 mg by mouth daily.    gabapentin (NEURONTIN) 800 MG tablet Take 800 mg by mouth 3 (three) times daily.    glimepiride (AMARYL) 4 MG tablet Take 4 mg by mouth daily.     metFORMIN (GLUCOPHAGE) 500 MG tablet Take 1,000 mg by mouth 2 (two) times daily with a meal.    triamcinolone cream (KENALOG) 0.1 % Apply 1 application topically daily.      STOP taking these medications     HYDROcodone-acetaminophen (NORCO) 10-325 MG per tablet        Allergies  Allergen Reactions  . Cucumber Extract Other (See Comments)    "Fells like I'm having a heart attack"  . Flexeril [Cyclobenzaprine Hcl] Other (See  Comments)    "whole body  Tremors" (05/27/2012)  . Kiwi Extract Other (See Comments)    "feels like I'm having a heart attack"      The results of significant diagnostics from this hospitalization (including imaging, microbiology, ancillary and laboratory) are listed below for reference.    Significant Diagnostic Studies: Dg Chest 2 View  04/02/2014   CLINICAL DATA:  63 year old male with productive cough and acute mid sternal chest pain with shortness of Breath. Initial encounter.  EXAM: CHEST  2 VIEW  COMPARISON:  12/17/2013 and earlier.  FINDINGS: Seated upright AP and lateral views at 1246 hrs. Large body habitus. Stable cardiomegaly and mediastinal contours. Visualized tracheal air column is within normal limits. Stable lung parenchyma. No pneumothorax, pulmonary edema, pleural effusion or consolidation. There is mild streaky opacity increased at the will right base only on the frontal view, most resembles atelectasis. No acute osseous abnormality identified.  IMPRESSION: Suspect mild right basilar atelectasis, otherwise no acute cardiopulmonary abnormality.   Electronically Signed   By: Lars Pinks M.D.   On: 04/02/2014 13:03    Dg Abd Acute W/chest  04/03/2014   CLINICAL DATA:  Productive cough. Shortness of breath. Small bowel obstruction.  EXAM: ACUTE ABDOMEN SERIES (ABDOMEN 2 VIEW & CHEST 1 VIEW)  COMPARISON:  Chest x-ray on 04/02/2014  FINDINGS: There is no evidence of dilated bowel loops or free intraperitoneal air. No radiopaque calculi or other significant radiographic abnormality is seen.  Heart size and mediastinal contours are within normal limits. No evidence of pulmonary consolidation or edema. No evidence of pleural effusion. A 1.6 cm nodular density is seen overlying the central right upper lobe, and small carcinoma cannot be excluded.  IMPRESSION: Unremarkable bowel gas pattern.  1.6 cm right upper lobe nodular density. Chest CT recommended to exclude pulmonary neoplasm.   Electronically Signed   By: Earle Gell M.D.   On: 04/03/2014 12:24   Dg Abd Portable 1v  04/08/2014   CLINICAL DATA:  Abdominal pain  EXAM: PORTABLE ABDOMEN - 1 VIEW  COMPARISON:  04/03/2014  FINDINGS: Scattered large and small bowel gas is noted. No acute bony abnormality is seen. No soft tissue changes are noted.  IMPRESSION: Nonspecific abdomen.   Electronically Signed   By: Inez Catalina M.D.   On: 04/08/2014 17:18    Microbiology: Recent Results (from the past 240 hour(s))  MRSA PCR Screening     Status: Abnormal   Collection Time: 04/03/14  9:03 AM  Result Value Ref Range Status   MRSA by PCR POSITIVE (A) NEGATIVE Final    Comment: CRITICAL RESULT CALLED TO, READ BACK BY AND VERIFIED WITH: PARRISH,N AT 10:50AM ON 04/03/14 BY FESTERMAN,C        The GeneXpert MRSA Assay (FDA approved for NASAL specimens only), is one component of a comprehensive MRSA colonization surveillance program. It is not intended to diagnose MRSA infection nor to guide or monitor treatment for MRSA infections.     Labs: Basic Metabolic Panel:  Recent Labs Lab 04/06/14 0609 04/06/14 0836 04/07/14 0548 04/08/14 0902 04/09/14 0551  NA 145  141 141 142 143  K 6.1* 4.9 4.9 4.7 4.9  CL 104 102 104 104 103  CO2 27 26 26 25 28   GLUCOSE 276* 243* 286* 245* 206*  BUN 35* 33* 39* 37* 40*  CREATININE 1.02 0.98 1.03 0.93 1.08  CALCIUM 9.2 8.9 8.7 8.8 9.0   Liver Function Tests:  Recent Labs Lab 04/04/14 0552 04/05/14 0621 04/06/14 [id number]  AST 11 12 12   ALT 7 8 12   ALKPHOS 59 62 65  BILITOT 0.3 0.4 0.5  PROT 7.2 7.7 7.8  ALBUMIN 3.2* 3.2* 3.3*   No results for input(s): LIPASE, AMYLASE in the last 168 hours. No results for input(s): AMMONIA in the last 168 hours. CBC:  Recent Labs Lab 04/04/14 0552 04/05/14 0621 04/06/14 0609 04/07/14 0548 04/08/14 0902  WBC 14.9* 12.9* 12.8* 11.7* 14.8*  NEUTROABS 12.6* 10.8* 11.6* 10.3*  --   HGB 11.8* 12.4* 13.3 12.3* 13.1  HCT 36.3* 38.2* 40.4 37.4* 38.8*  MCV 98.6 99.2 98.3 96.4 96.0  PLT 300 290 292 289 311   Cardiac Enzymes: No results for input(s): CKTOTAL, CKMB, CKMBINDEX, TROPONINI in the last 168 hours. BNP: BNP (last 3 results)  Recent Labs  04/05/14 0621  PROBNP 1106.0*   CBG:  Recent Labs Lab 04/08/14 1129 04/08/14 1624 04/08/14 2138 04/09/14 0804 04/09/14 1126  GLUCAP 255* 191* 207* 214* 263*       Signed:  Millisa Giarrusso  [facility] 04/09/2014, 2:38 PM

## 2014-04-09 NOTE — Progress Notes (Signed)
Patient discharged to [facility].  Patient in stable condition at discharge.  IV was removed and clean, dry, and intact at removal.  Patient transferred to [facility] via bed by nurse techs.

## 2014-04-09 NOTE — Plan of Care (Signed)
Problem: Phase I Progression Outcomes Goal: O2 sats > or equal 90% or at baseline Outcome: Progressing Patient O2 91%.

## 2014-04-09 NOTE — Plan of Care (Signed)
Problem: Phase I Progression Outcomes Goal: Hemodynamically stable Outcome: Completed/Met Date Met:  04/09/14     

## 2014-04-12 ENCOUNTER — Other Ambulatory Visit: Payer: Self-pay | Admitting: *Deleted

## 2014-04-12 ENCOUNTER — Non-Acute Institutional Stay (SKILLED_NURSING_FACILITY): Payer: Medicare Other | Admitting: Internal Medicine

## 2014-04-12 DIAGNOSIS — J441 Chronic obstructive pulmonary disease with (acute) exacerbation: Secondary | ICD-10-CM

## 2014-04-12 DIAGNOSIS — G250 Essential tremor: Secondary | ICD-10-CM

## 2014-04-12 DIAGNOSIS — I5032 Chronic diastolic (congestive) heart failure: Secondary | ICD-10-CM

## 2014-04-12 DIAGNOSIS — E1142 Type 2 diabetes mellitus with diabetic polyneuropathy: Secondary | ICD-10-CM

## 2014-04-12 LAB — GLUCOSE, CAPILLARY
GLUCOSE-CAPILLARY: 210 mg/dL — AB (ref 70–99)
GLUCOSE-CAPILLARY: 324 mg/dL — AB (ref 70–99)

## 2014-04-12 MED ORDER — OXYCODONE HCL 10 MG PO TABS: ORAL_TABLET | ORAL | Status: DC

## 2014-04-12 NOTE — Telephone Encounter (Signed)
[facility] 

## 2014-04-13 LAB — GLUCOSE, CAPILLARY
GLUCOSE-CAPILLARY: 130 mg/dL — AB (ref 70–99)
GLUCOSE-CAPILLARY: 332 mg/dL — AB (ref 70–99)
Glucose-Capillary: 326 mg/dL — ABNORMAL HIGH (ref 70–99)
Glucose-Capillary: 257 mg/dL — ABNORMAL HIGH (ref 70–99)
Glucose-Capillary: 277 mg/dL — ABNORMAL HIGH (ref 70–99)
Glucose-Capillary: 215 mg/dL — ABNORMAL HIGH (ref 70–99)

## 2014-04-13 NOTE — Progress Notes (Signed)
Patient ID: Eric Scott, male   DOB: September 10, 1950, 63 y.o.   MRN: [id number]               HISTORY & PHYSICAL  DATE:  04/12/2014    FACILITY: [facility]    LEVEL OF CARE:   SNF   CHIEF COMPLAINT:  Admission to SNF, post stay at [facility], 03/06/2014 through 04/09/2014.    HISTORY OF PRESENT ILLNESS:  This is a patient who was admitted to hospital for exacerbation of known COPD.  He was started on [facility].  He was also placed on a prednisone taper.  He requited oxygen.  However, subsequently this could be discontinued as his oxygen was greater than 90% on room air.    He was also felt to have evidence of volume overload.  An echocardiogram showed preserved EF.  It was felt that he had acute-on-chronic diastolic heart failure and initially received intravenous Lasix.  He was transitioned back to his home dose of 40 mg.  He was discharged looking euvolemic.    A CT scan of his chest indicated a possible pulmonary nodule, with follow-up CT scan with IV contrast suggested.    The patient did complain of right upper quadrant and left upper quadrant pain which was felt to be secondary to constipation.    He did get lethargic during the hospitalization.  His benzodiazepines were reduced (was on Valium 10 mg three times daily, I believe for essential tremor), as well as his narcotics were reduced.  He came to [place] on Valium 10 mg three times a day p.r.n., which once again he was taking routinely.    PAST MEDICAL HISTORY/PROBLEM LIST:    COPD with exacerbation.    Chronic diastolic heart failure.    Acute  respiratory failure with hypoxemia.    Chronic back pain.    Hypertension.    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    Solitary pulmonary nodule, with a repeat CT scan with IV contrast suggested as an outpatient.    Severe essential tremors, which I think is the reason he was on Valium.   In any case, he was on this at 10 mg three times a day.    CURRENT MEDICATIONS:   Discharge medications include:      Zithromax 250 a day.    DuoNebs 3 mL nebulization every 6 hours.    Dulera 200/5, 2 puffs twice a day.    Oxycodone 10 mg every 6 hours as needed for pain.    MiraLAX 17 g daily.    Prednisone, on a taper down to discontinued.  ces  Norvasc 10 mg daily.    Valium was changed to 10 mg q.8 p.r.n.    Enteric-coated aspirin 81 q.d.    Lipitor 20 q.d.    Celexa 10 mg daily.    Lasix 40 q.d.    Neurontin 800 mg, exact dose uncertain.    Amaryl 4 mg daily.    Glucophage 1000 b.i.d.    SOCIAL HISTORY:  The patient tells me he lives at [facility] assisted living for the last two years.  jo FUNCTIONAL STATUS:  He was not on oxygen at the assisted living.  States he could walk for short distances with a walker, although it sounds as though most of the time he was in a wheelchair.    FAMILY HISTORY:  Not currently available.    REVIEW OF SYSTEMS:   CHEST/RESPIRATORY:  States his shortness of breath  is at baseline.   CARDIAC:   No chest pain.   GI:  No nausea,  vomiting or abdominal pain.  Of note, the constipation in hospital does not seem to be a current issue.   SKIN:  Extremities:  States he was having his legs wrapped every two weeks with what appears to be Kerlix and Coban in the nursing home.     PHYSICAL EXAMINATION:   VITAL SIGNS:   O2 SATURATIONS:  90-91% on room air.   RESPIRATIONS:  20.   PULSE:  75.   GENERAL APPEARANCE:  The patient is not in any distress.  He has coarse tremors of his head, left greater than right arm, and to a lesser extent in his legs.   CHEST/RESPIRATORY:  Shallow, but otherwise clear air entry.  Mildly prolonged expiratory phase.   CARDIOVASCULAR:  CARDIAC:  Heart sounds are distant.  He appears to be euvolemic.  However, his JVP is moderately elevated at 45.   GASTROINTESTINAL:  LIVER/SPLEEN/KIDNEYS:  No liver, no spleen.   ABDOMEN:  No masses.   GENITOURINARY:  BLADDER:   Not distended.  There is no  costovertebral angle tenderness.   CIRCULATION:  EDEMA/VARICOSITIES:  Extremities:  He has severe bilateral venous stasis, but without any open wounds seen.   ARTERIAL:  Peripheral pulses are palpable.   NEUROLOGICAL:    SENSATION/STRENGTH:  He is weak in his lower extremities.   DEEP TENDON REFLEXES:  Diffusely hyporeflexic.  Has Charcot foot on the right greater than left.   PSYCHIATRIC:   MENTAL STATUS:   Somewhat anxious, perhaps somewhat depressed, although he appears to be cognitively intact.  Able to give his own history.    ASSESSMENT/PLAN:  COPD exacerbation.  His sats are 90-91.  He is on both DuoNebs and [place].  Certainly, the beta part of this could be making his tremors worse.  I will change the DuoNebs to p.r.n.  He is tapering on prednisone.  He probably needs an anticholinergic.  I will start that.    Severe essential tremors.  Probably made worse by the fact that his Valium was essentially stopped, although he has had one dose of this on both days over the weekend.   I will probably start him back on a lesser dose of Valium on a routine basis.    Constipation in the hospital.  He is on MiraLAX.    Chronic pain.  On oxycodone and Neurontin.  Probable diabetic neuropathy.    Diabetic neuropathy.  His blood sugars are ranging from the mid 100s fasting, up to 300 later in the day.  He probably will need an additional agent approximating insulin.    Solitary pulmonary nodule, with an enhanced CT scan suggested.  This can be "pursued in the outpatient setting".    Chronic low back pain.  The exact nature of this is unclear.  However, he apparently is on narcotics.  These were reduced in the hospital and he is simply on p.r.n. currently.    Chronic diastolic heart failure.  On Lasix 40 mg.  There is no evidence of this currently.    The patient is going to require physical therapy.  He moves slowly and with some difficulty.  He is borderline in terms of oxygen.  He will need to  be followed with regards to this.           v

## 2014-04-14 LAB — GLUCOSE, CAPILLARY
Glucose-Capillary: 124 mg/dL — ABNORMAL HIGH (ref 70–99)
Glucose-Capillary: 232 mg/dL — ABNORMAL HIGH (ref 70–99)
Glucose-Capillary: 193 mg/dL — ABNORMAL HIGH (ref 70–99)
Glucose-Capillary: 254 mg/dL — ABNORMAL HIGH (ref 70–99)

## 2014-04-15 LAB — GLUCOSE, CAPILLARY
GLUCOSE-CAPILLARY: 213 mg/dL — AB (ref 70–99)
GLUCOSE-CAPILLARY: 165 mg/dL — AB (ref 70–99)

## 2014-04-15 NOTE — Progress Notes (Signed)
UR chart review completed.  

## 2014-04-16 ENCOUNTER — Encounter: Payer: Self-pay | Admitting: Internal Medicine

## 2014-04-16 ENCOUNTER — Non-Acute Institutional Stay (SKILLED_NURSING_FACILITY): Payer: Medicare Other | Admitting: Internal Medicine

## 2014-04-16 DIAGNOSIS — I5032 Chronic diastolic (congestive) heart failure: Secondary | ICD-10-CM

## 2014-04-16 DIAGNOSIS — F411 Generalized anxiety disorder: Secondary | ICD-10-CM

## 2014-04-16 DIAGNOSIS — G252 Other specified forms of tremor: Secondary | ICD-10-CM

## 2014-04-16 DIAGNOSIS — R609 Edema, unspecified: Secondary | ICD-10-CM

## 2014-04-16 LAB — GLUCOSE, CAPILLARY
GLUCOSE-CAPILLARY: 207 mg/dL — AB (ref 70–99)
Glucose-Capillary: 112 mg/dL — ABNORMAL HIGH (ref 70–99)
Glucose-Capillary: 183 mg/dL — ABNORMAL HIGH (ref 70–99)

## 2014-04-16 NOTE — Progress Notes (Signed)
Patient ID: Eric Scott, male   DOB: 10-26-1950, 63 y.o.   MRN: [id number]   This is an acute visit. Level care skilled.Facility [facility].Chief complaintacute visit follow-up CHF-anxiety-tremors.  History of present illness.  Patient is a pleasant 63 year old male recently admitted after hospitalization for COPD-complicated apparently with some volume overload.  He was discharged on a prednisone taper --and appears to be doing relatively well in this regards.  He did have volume overload in the hospital and was given IV Lasix he was discharged on his home dose of Lasix 40 mg a day this appears to be fairly stable weights have been stable.  He does not complain of any increased shortness of breath or chest pain.  Patient was noted by Dr. Dellia Scott to have some coarse tremors earlier this week-it appears his Valium was discontinued in the hospital as well as some other medications secondary to concern about lethargy.  Dr. Dellia Scott did start his Valium lower dose 5 mg every 8 hours when necessary.  Appears a Valium also is for significant anxiety which he has a history of any says he is increasingly anxious and feels the 5 mg of Valium is not effective he has been on 10 4 apparently an extended period of time.  Vital signs continued to be stable otherwise he has no acute complaints today.  Family medical social history as been reviewed per progress note on 04/12/2014.  Medications have been reviewed per MAR.  Review of systems.  In general does not complain of any fever or chills his weight has been stable.  Respiratory does not complain of increased shortness of breath or coughing  Cardiac does not complaining of chest pain has some lower extremity edema although unsure whether this is really increased from his baseline this is more so on the right.  GI does not complain of any abdominal discomfort nausea or vomiting.  Muscle skeletal is not complaining specifically of joint pain  today appears to have a history of Charcot foot on the right  Neurologic is not complaining of headache or dizziness.--tremors appear to be improved today  Or syncopal episodes.  Psych does have history of anxiety.  Physical exam.  Temperature is 97.7 pulse 83 respirations 18 blood pressure 124/71 weight appears to be stable to 81.7.  In general this is a pleasant elderly male in no distress sitting comfortably in his wheelchair.  His skin is warm and dry.  Oropharynx clear mucous membranes moist.  Chest he has some rhonchi  Anterior fields that clear fairly significantly with cough there is no labored breathing  Heart is regular rate and rhythm although somewhat distant heart sounds-I do note what appears to be chronic lower extremity edema although somewhat increased on the right versus the left.  Abdomen is obese soft nontender with positive bowel sounds.  Muscle skeletal moves all extremities 4 with some lower extremity weakness he does have the Charcot, foot right leg again with some edema  Somewhat reduced pedal pulse.  Neurologic appears grossly intact I do not really note significant tremors today.  Psych he is alert and oriented pleasant and appropriate.  Labs.  #11 2015.  WBC 13.9 hemoglobin 13.7 platelets 183.  Sodium 142 potassium 4.9 BUN 30 creatinine 1.2.  Assessment and plan.  #1-history of CHF-this appears stable on Lasix weight has been stable-would like to obtain a metabolic panel to ensure electrolytes and renal function are stable I note he is not on potassium.  #2-history of right leg  edema-this could be chronic however  It is more Denna Muston the left leg will check a venous Doppler rule out any DVT. #3history of anxiety as well as apparently tremors-apparently improves with Valium-this was discussed with Dr. Dellia Scott via phone and will changes Valium to routine 10 mg twice a day when necessary-apparently this has been more effective in the past for  patient  #4-history of leukocytosis-I suspect this is prednisone related continue to monitor he is afebrile and does not complain of any dysuria or increased congestion or cough.  [id number]  [phone]

## 2014-04-17 LAB — GLUCOSE, CAPILLARY
Glucose-Capillary: 145 mg/dL — ABNORMAL HIGH (ref 70–99)
Glucose-Capillary: 214 mg/dL — ABNORMAL HIGH (ref 70–99)
Glucose-Capillary: 277 mg/dL — ABNORMAL HIGH (ref 70–99)
Glucose-Capillary: 211 mg/dL — ABNORMAL HIGH (ref 70–99)

## 2014-04-18 DIAGNOSIS — F411 Generalized anxiety disorder: Secondary | ICD-10-CM | POA: Insufficient documentation

## 2014-04-18 DIAGNOSIS — G252 Other specified forms of tremor: Secondary | ICD-10-CM | POA: Insufficient documentation

## 2014-04-18 LAB — GLUCOSE, CAPILLARY
GLUCOSE-CAPILLARY: 216 mg/dL — AB (ref 70–99)
GLUCOSE-CAPILLARY: 219 mg/dL — AB (ref 70–99)
GLUCOSE-CAPILLARY: 180 mg/dL — AB (ref 70–99)
Glucose-Capillary: 145 mg/dL — ABNORMAL HIGH (ref 70–99)

## 2014-04-19 ENCOUNTER — Ambulatory Visit (HOSPITAL_COMMUNITY): Payer: Medicare Other | Attending: Internal Medicine

## 2014-04-19 DIAGNOSIS — M7989 Other specified soft tissue disorders: Secondary | ICD-10-CM | POA: Insufficient documentation

## 2014-04-19 LAB — GLUCOSE, CAPILLARY
GLUCOSE-CAPILLARY: 150 mg/dL — AB (ref 70–99)
GLUCOSE-CAPILLARY: 139 mg/dL — AB (ref 70–99)
Glucose-Capillary: 86 mg/dL (ref 70–99)
Glucose-Capillary: 130 mg/dL — ABNORMAL HIGH (ref 70–99)

## 2014-04-20 LAB — GLUCOSE, CAPILLARY
Glucose-Capillary: 263 mg/dL — ABNORMAL HIGH (ref 70–99)
Glucose-Capillary: 99 mg/dL (ref 70–99)
Glucose-Capillary: 112 mg/dL — ABNORMAL HIGH (ref 70–99)

## 2014-04-21 LAB — GLUCOSE, CAPILLARY: Glucose-Capillary: 89 mg/dL (ref 70–99)

## 2014-04-22 ENCOUNTER — Encounter: Payer: Self-pay | Admitting: Internal Medicine

## 2014-04-22 ENCOUNTER — Non-Acute Institutional Stay (SKILLED_NURSING_FACILITY): Payer: Medicare Other | Admitting: Internal Medicine

## 2014-04-22 DIAGNOSIS — G8929 Other chronic pain: Secondary | ICD-10-CM

## 2014-04-22 DIAGNOSIS — G252 Other specified forms of tremor: Secondary | ICD-10-CM

## 2014-04-22 DIAGNOSIS — I5032 Chronic diastolic (congestive) heart failure: Secondary | ICD-10-CM

## 2014-04-22 DIAGNOSIS — J441 Chronic obstructive pulmonary disease with (acute) exacerbation: Secondary | ICD-10-CM

## 2014-04-22 LAB — GLUCOSE, CAPILLARY
GLUCOSE-CAPILLARY: 120 mg/dL — AB (ref 70–99)
GLUCOSE-CAPILLARY: 160 mg/dL — AB (ref 70–99)
Glucose-Capillary: 163 mg/dL — ABNORMAL HIGH (ref 70–99)
Glucose-Capillary: 149 mg/dL — ABNORMAL HIGH (ref 70–99)

## 2014-04-22 NOTE — Progress Notes (Signed)
Patient ID: Eric Scott, male   DOB: 1951-02-24, 63 y.o.   MRN: [id number]   This is an acute visit.  Level care skilled.  Facility [facility].  Chief complaint-acute visit secondary to pain management-follow-up CHF-follow-up tremors with anxiety.  History of present O's.  Patient is a pleasant 63 year old male recently admitted after hospitalization for COPD complicated with volume overload.  He was discharged on a prednisone taper and appears to be doing well in this issue.  Also had volume overload in the hospital required IV Lasix was discharged on Lasix 40 mg a day which apparently he had been on previously.  This appears to be stable his weights have been stable he is not complaining of any increased shortness of breath or cough.  Last week Dr. Dellia Nims did note some coarse tremors his Valium had been discontinued in the hospital secondary to concerns about lethargy-his Valium has been restarted routinely at 10 mg twice a day and apparently this is helping some his tremors appear to be improved-per nursing staff is anxiety also appears to be somewhat better.  His main complaint today is chronic leg pain-he was discharged on oxycodone 10 mg every 6 hours when necessary but he says this is not totally effective-nursing staff appears to concur with this.  Family medical social history is been reviewed most recently progress note on 04/16/2014 as well as admission note on 04/12/2014.  Medications have been reviewed per [facility] of note he also is on Neurontin 800 mg 3 times a day he does have history of diabetic neuropathy.  Review of systems.  General does not complaining fever chills.  Respiratory no complaints of shortness of breath currently.  Cardiac does not complain of chest pain he does have chronic lower extremity edema.  GI no complaints of nausea vomiting abdominal discomfort.  Neurologic is not complaining of dizziness or headaches did have some tremors these appear  to be somewhat improved with the routine Valium.  Psych he does again have some history of anxiety as noted above.  Physical exam.  Temperature is 97.3 pulse 76 respirations 20 blood pressure 112/71.  General this is a pleasant elderly male in no distress sitting comfortably in his wheelchair.  His skin is warm and dry.  Chest has shallow air entry but no labored breathing there is no congestion noted.  Heart is regular rate and rhythm he does appear to have baseline lower extremity edema this does not appear to be increased from baseline.  Abdomen is soft nontender with positive bowel sounds.  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 04/19/2014.  WBC 11.6 hemoglobin 13.2 platelets 183.  Sodium 143 potassium 4.5 BUN 23 creatinine 1.17.  Assessment and plan.  #1 pain management-Will start patient on controlled release OxyContin 10 mg twice a day and continue the when necessary oxycodone-monitor for any sedation-apparently lethargy sedation has not been an issue during his stay here but this will have to be monitored.    #2 tremors-this appears to be somewhat improved he is on  the routine Valium and apparently has tolerated this well again this will have to be monitored as well   3-leukocytosis-this is trending down most likely the result of his prednisone taper.  #4-history COPD with exasperation this appears to be able he continues on DuoNeb's and Dulera the duo nebs are when necessary again was thought this may be  contributing to his tremors as well.--He is also on Spiriva  5-history CHF-this appears stable weights and edema appeared to be stable clinically he appears to be at baseline he is on Lasix-will update a metabolic panel next week  [id number]

## 2014-04-23 LAB — GLUCOSE, CAPILLARY
GLUCOSE-CAPILLARY: 121 mg/dL — AB (ref 70–99)
Glucose-Capillary: 149 mg/dL — ABNORMAL HIGH (ref 70–99)
Glucose-Capillary: 139 mg/dL — ABNORMAL HIGH (ref 70–99)
Glucose-Capillary: 212 mg/dL — ABNORMAL HIGH (ref 70–99)

## 2014-04-24 LAB — GLUCOSE, CAPILLARY
Glucose-Capillary: 125 mg/dL — ABNORMAL HIGH (ref 70–99)
Glucose-Capillary: 149 mg/dL — ABNORMAL HIGH (ref 70–99)

## 2014-04-25 LAB — GLUCOSE, CAPILLARY
GLUCOSE-CAPILLARY: 157 mg/dL — AB (ref 70–99)
GLUCOSE-CAPILLARY: 183 mg/dL — AB (ref 70–99)

## 2014-04-26 LAB — GLUCOSE, CAPILLARY
GLUCOSE-CAPILLARY: 106 mg/dL — AB (ref 70–99)
GLUCOSE-CAPILLARY: 142 mg/dL — AB (ref 70–99)
Glucose-Capillary: 149 mg/dL — ABNORMAL HIGH (ref 70–99)

## 2014-04-27 LAB — GLUCOSE, CAPILLARY: GLUCOSE-CAPILLARY: 89 mg/dL (ref 70–99)

## 2014-04-28 ENCOUNTER — Non-Acute Institutional Stay (SKILLED_NURSING_FACILITY): Payer: Medicare Other | Admitting: Internal Medicine

## 2014-04-28 ENCOUNTER — Encounter: Payer: Self-pay | Admitting: Internal Medicine

## 2014-04-28 DIAGNOSIS — I5032 Chronic diastolic (congestive) heart failure: Secondary | ICD-10-CM

## 2014-04-28 DIAGNOSIS — R609 Edema, unspecified: Secondary | ICD-10-CM

## 2014-04-28 DIAGNOSIS — D72829 Elevated white blood cell count, unspecified: Secondary | ICD-10-CM

## 2014-04-28 LAB — GLUCOSE, CAPILLARY: Glucose-Capillary: 91 mg/dL (ref 70–99)

## 2014-04-28 NOTE — Progress Notes (Signed)
Patient ID: Eric Scott, male   DOB: [birth year], 63 y.o.   MRN: [id number]   This is an acute visit.  Level care skilled.  Facility [facility].  Chief complaint acute visit secondary to weight gain.  History of present illness.  Patient is a pleasant 63 year old male here for rehabilitation after hospitalization for COPD exasperation.  This was treated with [facility] as well as a prednisone taper and appears this has stabilized.  Also thought to have volume overload echocardiogram showed preserved ejection fraction thought he had acute on chronic diastolic CHF and received IV Lasix in the hospital-he was transitioned back to his home dose of 40 mg upon discharge to the facility.  Nursing staff did note possible weight gain of about 4 pounds over the past couple days-weight today is noted to be 286.8 yesterday was 282.2 back on November 9 18 to similar 2 82.9-I do note he was 284 back on November 16  \He does not complain of any increased shortness of breath his O2 saturation is 96% on room air-however he says he has noted some increased edema the last day or 2.  Family medical social history is been reviewed per admission note on 04/12/2014.  Medications have been reviewed per [facility] he is on Lasix 40 mg a day.  Review of systems.  In general no complaints of fever chills has had some mild weight gain it appears.  Cardiac does not complain of chest pain or palpitations.  Respiratory is not complaining of increased shortness of breath or increased cough from baseline he does have a history of COPD.  GI does not complain of abdominal discomfort nausea or vomiting.  Musculoskeletal is not currently complaining of pain this has been an issue in the past but appears this has stabilized on current medications including oxycodone controlled release with oxycodone 10 mg for breakthrough pain.  Neurologic does have a history of peripheral neuropathy he is on Neurontin this appears to be at  baseline per patient.  Physical exam.  Temperature is 90.3 pulse 60 respirations 18 blood pressure 117/76.  In general this is a pleasant male in no distress sitting comfortably in his wheelchair.  His skin is warm and dry.  Oropharynx clear mucous membranes appear fairly moist.  Chest is clear to auscultation with somewhat shallow air entry there is no labored breathing.  Heart is regular rate and rhythm without murmur gallop or rub he does have some chronic lower extremity edema this  appears slightly increased from previous exam--is a 2+- especially in the feet bilaterally he does have venous stasis changes of his shins bilaterally which is not new.  Abdomen is obese soft nontender positive bowel sounds.  Muscle skeletal moves his extremities 4 at baseline with some lower extremity weakness which is not new either he does have a history of charcot feet  Neurologic appears grossly intact he has minimal tremor upper extremities this appears to have improved    Psych-- he continues to be alert and oriented pleasant and appropriate.  Labs.  04/26/2014.  Sodium 140 potassium 4.3 BUN 21 creatinine 1.25.  04/14/2014.  WBC 13.9 hemoglobin 13.7 platelets 183.  Assessment and plan.   #1-moderate weight gain with some  slightly increased edema with history of diastolic CHF-clinically he appears stable however he does appear to have some slightly increased edema and weight gain although not totally convinced this might be a one-day variation  With the scale-this will have to be monitored-we'll increase his Lasix for  3 days to 60 mg a day-also update a metabolic panel on Friday--continue to monitor weights daily notify provider of any continued weight gain  I do note he is not on potassium per chart review it appears earlier this month his potassium was up to 6.1-again will update a metabolic panel in a couple days and see where we stand  #2-leukocytosis-this was thought to be  secondary to his prednisone taper at this point will monitor with a CBC on Friday as well  [id number]

## 2014-04-29 LAB — GLUCOSE, CAPILLARY: Glucose-Capillary: 99 mg/dL (ref 70–99)

## 2014-04-30 LAB — GLUCOSE, CAPILLARY: Glucose-Capillary: 101 mg/dL — ABNORMAL HIGH (ref 70–99)

## 2014-05-01 LAB — GLUCOSE, CAPILLARY: GLUCOSE-CAPILLARY: 96 mg/dL (ref 70–99)

## 2014-05-02 ENCOUNTER — Non-Acute Institutional Stay (SKILLED_NURSING_FACILITY): Payer: Medicare Other | Admitting: Internal Medicine

## 2014-05-02 ENCOUNTER — Encounter: Payer: Self-pay | Admitting: Internal Medicine

## 2014-05-02 DIAGNOSIS — I5032 Chronic diastolic (congestive) heart failure: Secondary | ICD-10-CM

## 2014-05-02 DIAGNOSIS — R635 Abnormal weight gain: Secondary | ICD-10-CM

## 2014-05-02 LAB — GLUCOSE, CAPILLARY: GLUCOSE-CAPILLARY: 79 mg/dL (ref 70–99)

## 2014-05-02 NOTE — Progress Notes (Signed)
Patient ID: Eric Scott, male   DOB: Oct 11, 1950, 63 y.o.   MRN: [id number] Patient ID: Eric Scott, male   DOB: [birth year], 63 y.o.   MRN: [id number]   This is an acute visit.  Level care skilled.  Facility [facility].  Chief complaint acute visit secondary to weight gain.  History of present illness.  Patient is a pleasant 63 year old male here for rehabilitation after hospitalization for COPD exasperation.  This was treated with [facility] as well as a prednisone taper and appears this has stabilized.  Also thought to have volume overload echocardiogram showed preserved ejection fraction thought he had acute on chronic diastolic CHF and received IV Lasix in the hospital-he was transitioned back to his home dose of 40 mg upon discharge to the facility.  Earlier this week nursing staff f did note possible weight gain of about 4 pounds over the past couple days-weightFriday is noted to be 286.8 day before was 282.2 back on November 9 18 to similar 2 82.9-I do note he was 284 back on November 16  Weight today was 288.5-however yesterday it was 284.3.  Of note I did increase his Lasix to 60 mg day for 3 days earlier in the week and then back to 40.  Dr. Dellia Nims was on call and did give patient-an dose of 40 mg today and increased his Lasix to 60 mg routinely--  Clinically he continues to be stable ambulates about facility in his wheelchair actually appears to be doing better does not complain of shortness of breath which is encouraging.  Of note he is not on potassium I note at one point his potassium was 6.1 so would be hesitant to be real aggressive here but will have to monitor his electrolytes.  Lab done on November 27 showed a potassium of 4.2.    .  Family medical social history has been reviewed per admission note on 04/12/2014.  Medications have been reviewed per [facility] he is now on Lasix 60 mg a day.  Review of systems.  In general no complaints of fever chills has had  some mild weight gain it appears although there is variation--.  Cardiac does not complain of chest pain or palpitations.  Respiratory is not complaining of increased shortness of breath or increased cough from baseline he does have a history of COPD.  GI does not complain of abdominal discomfort nausea or vomiting.  Musculoskeletal is not currently complaining of pain this has been an issue in the past but appears this has stabilized on current medications including oxycodone controlled release with oxycodone 10 mg for breakthrough pain.  Neurologic does have a history of peripheral neuropathy he is on Neurontin this appears to be at baseline per patient.  Physical exam.  Temperature 97.2-pulse 77-respirations 20-blood pressure 120/60--oxygen saturation in the 90s on room air.  In general this is a pleasant male in no distress sitting comfortably in his wheelchair.  His skin is warm and dry.  Oropharynx clear mucous membranes appear fairly moist.  Chest is clear to auscultation with somewhat shallow air entry there is no labored breathing.  Heart is regular rate and rhythm without murmur gallop or rub- distant heart sounds- he does have some chronic lower extremity edema this  appears similar to slightly improved compared to  previous exam--is 1- 2+- especially in the feet bilaterally he does have venous stasis changes of his shins bilaterally which is not new.  Abdomen is obese soft nontender positive bowel sounds.  Muscle  skeletal moves his extremities 4 at baseline with some lower extremity weakness which is not new either he does have a history of charcot feet  Neurologic appears grossly intact he has minimal tremor upper extremities this appears to have improved    Psych-- he continues to be alert and oriented pleasant and appropriate.  Labs.  04/30/2014.  WBC 6.0 hemoglobin 11.7 platelets 2:15.  Sodium 142 potassium 4.2 BUN 23 creatinine 1.31--CO2 is  27  04/26/2014.  Sodium 140 potassium 4.3 BUN 21 creatinine 1.25.  04/14/2014.  WBC 13.9 hemoglobin 13.7 platelets 183.  Assessment and plan.   #1 weight gain with some  slightly increased edema with history of diastolic CHF--the edema appears to have stabilized-there have been somewhat variable weights but appears to be trending somewhat higher when he is on a lower dose of Lasix-again this has been increased to 60 mg a day with  additional dose of 40 mg done earlier today-and updated metabolic panel has been ordered for tomorrow also will order one for later in the week to ensure stability of renal function and electrolytes-clinically he appears to be stable-again he is not on potassium with some history of hyperkalemia in the past      #2-leukocytosis-this was thought to be secondary to his prednisone taper --this has normalized on most recent lab  [id number]

## 2014-05-03 LAB — GLUCOSE, CAPILLARY: GLUCOSE-CAPILLARY: 98 mg/dL (ref 70–99)

## 2014-05-04 ENCOUNTER — Other Ambulatory Visit: Payer: Self-pay | Admitting: *Deleted

## 2014-05-04 LAB — GLUCOSE, CAPILLARY: GLUCOSE-CAPILLARY: 82 mg/dL (ref 70–99)

## 2014-05-04 MED ORDER — OXYCODONE HCL 10 MG PO TABS: ORAL_TABLET | ORAL | Status: DC

## 2014-05-04 NOTE — Telephone Encounter (Signed)
[facility] 

## 2014-05-05 LAB — GLUCOSE, CAPILLARY: GLUCOSE-CAPILLARY: 87 mg/dL (ref 70–99)

## 2014-05-06 ENCOUNTER — Other Ambulatory Visit: Payer: Self-pay | Admitting: *Deleted

## 2014-05-06 LAB — GLUCOSE, CAPILLARY: GLUCOSE-CAPILLARY: 82 mg/dL (ref 70–99)

## 2014-05-06 MED ORDER — OXYCODONE HCL 10 MG PO TABS: ORAL_TABLET | ORAL | Status: DC

## 2014-05-06 MED ORDER — DIAZEPAM 10 MG PO TABS: ORAL_TABLET | ORAL | Status: DC

## 2014-05-06 NOTE — Telephone Encounter (Signed)
[facility] 

## 2014-05-06 NOTE — Telephone Encounter (Signed)
[facility] 

## 2014-05-07 ENCOUNTER — Other Ambulatory Visit: Payer: Self-pay | Admitting: *Deleted

## 2014-05-07 LAB — GLUCOSE, CAPILLARY: GLUCOSE-CAPILLARY: 94 mg/dL (ref 70–99)

## 2014-05-07 MED ORDER — AMBULATORY NON FORMULARY MEDICATION: Status: DC

## 2014-05-07 NOTE — Telephone Encounter (Signed)
[facility] 

## 2014-05-08 LAB — GLUCOSE, CAPILLARY: Glucose-Capillary: 77 mg/dL (ref 70–99)

## 2014-05-09 LAB — GLUCOSE, CAPILLARY: Glucose-Capillary: 96 mg/dL (ref 70–99)

## 2014-05-10 LAB — GLUCOSE, CAPILLARY: Glucose-Capillary: 94 mg/dL (ref 70–99)

## 2014-05-11 ENCOUNTER — Encounter: Payer: Self-pay | Admitting: Internal Medicine

## 2014-05-11 ENCOUNTER — Non-Acute Institutional Stay (SKILLED_NURSING_FACILITY): Payer: Medicare Other | Admitting: Internal Medicine

## 2014-05-11 DIAGNOSIS — E118 Type 2 diabetes mellitus with unspecified complications: Secondary | ICD-10-CM

## 2014-05-11 DIAGNOSIS — J441 Chronic obstructive pulmonary disease with (acute) exacerbation: Secondary | ICD-10-CM

## 2014-05-11 DIAGNOSIS — G8929 Other chronic pain: Secondary | ICD-10-CM

## 2014-05-11 DIAGNOSIS — I5032 Chronic diastolic (congestive) heart failure: Secondary | ICD-10-CM

## 2014-05-11 DIAGNOSIS — G252 Other specified forms of tremor: Secondary | ICD-10-CM

## 2014-05-11 LAB — GLUCOSE, CAPILLARY: Glucose-Capillary: 92 mg/dL (ref 70–99)

## 2014-05-11 NOTE — Progress Notes (Signed)
Patient ID: Eric Scott, male   DOB: [birth year], 63 y.o.   MRN: [id number] Patient ID: Eric Scott, male   DOB: [birth year], 63 y.o.   MRN: [id number]                 DATE:   05/11/2014  FACILITY: [facility]    LEVEL OF CARE:   SNF  This is a discharge note   CHIEF COMPLAINT: Discharge note    HISTORY OF PRESENT ILLNESS:  This is a patient who was admitted to hospital for exacerbation of known COPD.  He was started on [facility].  He was also placed on a prednisone taper.  He requited oxygen.  However, subsequently this was discontinues  as his oxygen was greater than 90% on room air.    He was also felt to have evidence of volume overload.  An echocardiogram showed preserved EF.  It was felt that he had acute-on-chronic diastolic heart failure and initially received intravenous Lasix.  He was transitioned back to his home dose of 40 mg.  He was discharged looking euvolemic--however during her stay here he has had some increased weight gain and edema-his Lasix has been increased to 60 mg a day appears to have tolerated this well weights have stabilized he does not complain of any increased shortness of breath-and in fact the edema looks improved.    A CT scan of his chest indicated a possible pulmonary nodule, with follow-up CT scan with IV contrast suggested. --This will more follow up by primary care provider   The patient did complain of right upper quadrant and left upper quadrant pain which was felt to be secondary to constipation. This has not been an issue during his stay here   He did get lethargic during the hospitalization.  His benzodiazepines were reduced (was on Valium 10 mg three times daily, I believe for essential tremor), as well as his narcotics were reduced.  He came to [place] on Valium 10 mg three times a day p.r.n.,--Currently this has been reduced to twice a day when necessary and appears to be stable.  He has had significant complaints of pain however he is now  on oxycodone extended release 10 mg twice a day as well as 10 mg every 6 hours for breakthrough pain and this appears to be effective.  He is slated to return to his assisted living facility later in the week-clinically he appears to be stable he will need continued PT and OT for strengthening  Of note in regards to his other issues.  He is a type II diabetic on Amaryl as well as Glucophage his sugars appear to be stable although I do note frequent readings in the 80s to 90s-suspect we will reduce his Amaryl slightly to avoid hypoglycemia.  Marland Kitchen      PAST MEDICAL HISTORY/PROBLEM LIST:    COPD with exacerbation.    Chronic diastolic heart failure.    Acute  respiratory failure with hypoxemia.    Chronic back pain.    Hypertension.    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    Solitary pulmonary nodule, with a repeat CT scan with IV contrast suggested as an outpatient.    Severe essential tremors, which I think is the reason he was on Valium. --These appear to be stable improved during his stay here  CURRENT MEDICATIONS:  Discharge medications include:      Oxycodone controlled release 10 mg twice a day.    DuoNebs 3  mL nebulization every 6 hours.    Dulera 200/5, 2 puffs twice a day.    Oxycodone 10 mg every 6 hours as needed for pain.    MiraLAX 17 g daily.    Norvasc 10 mg daily.    Valium 10 mg twice a day when necessary    Enteric-coated aspirin 81 q.d.    Lipitor 20 q.d.    Celexa210 mg daily.    Lasix 60 q.d.    Neurontin 800 mg, exact dose uncertain.    Amaryl 4 mg daily.    Glucophage 1000 b.i.d.    SOCIAL HISTORY:  The patient has lived  at [facility] assisted living for the last two years.   FUNCTIONAL STATUS:  He was not on oxygen at the assisted living.  States he could walk for short distances with a walker, although it sounds as though most of the time he was in a wheelchair--continues to ambulate largely in a wheelchair here.    FAMILY  HISTORY:  Not currently available.    REVIEW OF SYSTEMS: General no complaints says he feels well.  Skin does not complain of any rashes or itching   CHEST/RESPIRATORY:  States his shortness of breath is at baseline.   CARDIAC:   No chest pain--edema appears baseline to somewhat improved.   GI:  No nausea,  vomiting or abdominal pain.  Of note, the constipation in hospital does not seem to be a current issue.   SKIN:  Is not complaining of rashes or itching.  Musculoskeletal-has a history of chronic back pain this is controlled on her pain medications does not complain of pain today.  Neurologic does not complain of headache dizziness has history of neuropathy lower extremities bilaterally.  Psych-history of depression and anxiety but this appears stable presently     PHYSICAL EXAMINATION:    Temperature 98.2 pulse 75 respirations 18 blood pressure 119/65-weight is 285 this again appears to be relatively stable to slightly reduced compared to last week   GENERAL APPEARANCE:  The patient is not in any distress. Sitting comfortably in his wheelchair.  His skin is warm and dry.  .   CHEST/RESPIRATORY:  Shallow, but otherwise clear air entry.  Mildly prolonged expiratory phase.   CARDIOVASCULAR:  CARDIAC:  Regular rate and rhythm.  Edema appears stable to actually somewhat improved-he has compression hose on --continues  with significant bilateral venous stasis.   GASTROINTESTINAL:  Abdomen is obese soft nontender with positive bowel sou  .   NEUROLOGICAL:    SENSATION/STRENGTH:  He is weak in his lower extremities upper extremity strength appears to be intact--    .  Has Charcot foot on the right greater than left.  Neurologic-did not note any focal deficits speech is clear   PSYCHIATRIC:   MENTAL STATUS: Appears to be alert and oriented pleasant and appropriate-   Labs.  05/10/2014.  Sodium 144 potassium 4.2 BUN 21 creatinine 1.24.  05/03/2014.  Hemoglobin A1c  6.4.  04/30/2014.  WBC 6.0 hemoglobin 11.5 platelets 2:15.      ASSESSMENT/PLAN:  COPD exacerbation.  This has not really been an issue during his stay here continues on when necessary nebulizers-Dulera-and Spiriva  .    Severe essential tremors. This appears to have improved continues on Valium as needed    .    Constipation in the hospital.  He is on MiraLAX.-Apparently has not been an issue during his stay here    Chronic pain.  On oxycodone and Neurontin.  Probable diabetic neuropathy.    Diabetic neuropathy. 10 - on Neurontin-regards to diabetes as noted above sugars appear to be stable to somewhat low in the morning will decrease his Amaryl to 2 mg a day continues on Glucophage as well  .    Solitary pulmonary nodule, with an enhanced CT scan suggested.  This can be "pursued in the outpatient setting. --Recommend followed by primary care provider   Chronic low back pain.  The exact nature of this is unclear. But  pain appears controlled now on the controlled OxyContin as well as OxyIR as needed  .    Chronic diastolic heart failure.  On Lasix 60 mg. Stable-.--Recent metabolic panel showed stability    Patient will be going back to his assisted living facility later this week-will need continued PT and OT-for strengthening with some continued debility.    [id number] note greater than 30 minutes spent on this discharge summary     .           v

## 2014-05-12 LAB — GLUCOSE, CAPILLARY: Glucose-Capillary: 103 mg/dL — ABNORMAL HIGH (ref 70–99)

## 2014-05-13 LAB — GLUCOSE, CAPILLARY: Glucose-Capillary: 98 mg/dL (ref 70–99)

## 2014-05-14 LAB — GLUCOSE, CAPILLARY: GLUCOSE-CAPILLARY: 73 mg/dL (ref 70–99)

## 2014-05-20 ENCOUNTER — Encounter (HOSPITAL_COMMUNITY): Payer: Self-pay | Admitting: Emergency Medicine

## 2014-05-20 ENCOUNTER — Emergency Department (HOSPITAL_COMMUNITY): Payer: Medicare Other

## 2014-05-20 ENCOUNTER — Emergency Department (HOSPITAL_COMMUNITY)
Admission: EM | Admit: 2014-05-20 | Discharge: 2014-05-20 | Disposition: A | Discharge status: Home / Self Care | Payer: Medicare Other | Attending: Emergency Medicine | Admitting: Emergency Medicine

## 2014-05-20 DIAGNOSIS — Y998 Other external cause status: Secondary | ICD-10-CM | POA: Diagnosis not present

## 2014-05-20 DIAGNOSIS — Z7982 Long term (current) use of aspirin: Secondary | ICD-10-CM | POA: Insufficient documentation

## 2014-05-20 DIAGNOSIS — F419 Anxiety disorder, unspecified: Secondary | ICD-10-CM | POA: Insufficient documentation

## 2014-05-20 DIAGNOSIS — E0940 Drug or chemical induced diabetes mellitus with neurological complications with diabetic neuropathy, unspecified: Secondary | ICD-10-CM | POA: Diagnosis not present

## 2014-05-20 DIAGNOSIS — T50905A Adverse effect of unspecified drugs, medicaments and biological substances, initial encounter: Secondary | ICD-10-CM | POA: Insufficient documentation

## 2014-05-20 DIAGNOSIS — Z79899 Other long term (current) drug therapy: Secondary | ICD-10-CM | POA: Insufficient documentation

## 2014-05-20 DIAGNOSIS — M549 Dorsalgia, unspecified: Secondary | ICD-10-CM | POA: Insufficient documentation

## 2014-05-20 DIAGNOSIS — M7989 Other specified soft tissue disorders: Secondary | ICD-10-CM

## 2014-05-20 DIAGNOSIS — G8929 Other chronic pain: Secondary | ICD-10-CM | POA: Insufficient documentation

## 2014-05-20 DIAGNOSIS — T887XXA Unspecified adverse effect of drug or medicament, initial encounter: Secondary | ICD-10-CM | POA: Insufficient documentation

## 2014-05-20 DIAGNOSIS — F329 Major depressive disorder, single episode, unspecified: Secondary | ICD-10-CM | POA: Diagnosis not present

## 2014-05-20 DIAGNOSIS — Z7952 Long term (current) use of systemic steroids: Secondary | ICD-10-CM | POA: Diagnosis not present

## 2014-05-20 DIAGNOSIS — Z8673 Personal history of transient ischemic attack (TIA), and cerebral infarction without residual deficits: Secondary | ICD-10-CM | POA: Insufficient documentation

## 2014-05-20 DIAGNOSIS — Z720 Tobacco use: Secondary | ICD-10-CM | POA: Insufficient documentation

## 2014-05-20 DIAGNOSIS — M869 Osteomyelitis, unspecified: Secondary | ICD-10-CM | POA: Diagnosis not present

## 2014-05-20 DIAGNOSIS — L03116 Cellulitis of left lower limb: Secondary | ICD-10-CM | POA: Diagnosis not present

## 2014-05-20 DIAGNOSIS — Z7951 Long term (current) use of inhaled steroids: Secondary | ICD-10-CM | POA: Insufficient documentation

## 2014-05-20 DIAGNOSIS — E780 Pure hypercholesterolemia: Secondary | ICD-10-CM | POA: Insufficient documentation

## 2014-05-20 DIAGNOSIS — E0969 Drug or chemical induced diabetes mellitus with other specified complication: Secondary | ICD-10-CM | POA: Insufficient documentation

## 2014-05-20 DIAGNOSIS — S99922A Unspecified injury of left foot, initial encounter: Secondary | ICD-10-CM | POA: Diagnosis present

## 2014-05-20 DIAGNOSIS — Y939 Activity, unspecified: Secondary | ICD-10-CM | POA: Insufficient documentation

## 2014-05-20 DIAGNOSIS — Y929 Unspecified place or not applicable: Secondary | ICD-10-CM | POA: Insufficient documentation

## 2014-05-20 DIAGNOSIS — X58XXXA Exposure to other specified factors, initial encounter: Secondary | ICD-10-CM | POA: Diagnosis not present

## 2014-05-20 DIAGNOSIS — E1369 Other specified diabetes mellitus with other specified complication: Secondary | ICD-10-CM

## 2014-05-20 DIAGNOSIS — I10 Essential (primary) hypertension: Secondary | ICD-10-CM | POA: Insufficient documentation

## 2014-05-20 LAB — BASIC METABOLIC PANEL
ANION GAP: 11 (ref 5–15)
BUN: 16 mg/dL (ref 6–23)
CHLORIDE: 102 meq/L (ref 96–112)
CO2: 29 mEq/L (ref 19–32)
Calcium: 9.5 mg/dL (ref 8.4–10.5)
Creatinine, Ser: 1.09 mg/dL (ref 0.50–1.35)
GFR calc non Af Amer: 70 mL/min — ABNORMAL LOW (ref >90)
GFR, EST AFRICAN AMERICAN: 82 mL/min — AB (ref >90)
Glucose, Bld: 102 mg/dL — ABNORMAL HIGH (ref 70–99)
POTASSIUM: 4.3 meq/L (ref 3.7–5.3)
SODIUM: 142 meq/L (ref 137–147)

## 2014-05-20 LAB — CBG MONITORING, ED: Glucose-Capillary: 108 mg/dL — ABNORMAL HIGH (ref 70–99)

## 2014-05-20 MED ORDER — DOXYCYCLINE HYCLATE 100 MG PO TABS
100.0000 mg | ORAL_TABLET | Freq: Once | ORAL | Status: AC
  Administered 2014-05-20: 100 mg via ORAL
  Filled 2014-05-20: qty 1

## 2014-05-20 MED ORDER — OXYCODONE-ACETAMINOPHEN 5-325 MG PO TABS
1.0000 | ORAL_TABLET | Freq: Once | ORAL | Status: AC
Start: 2014-05-20 — End: 2014-05-20
  Administered 2014-05-20: 1 via ORAL
  Filled 2014-05-20: qty 1

## 2014-05-20 MED ORDER — DIAZEPAM 5 MG PO TABS
5.0000 mg | ORAL_TABLET | Freq: Once | ORAL | Status: AC
  Administered 2014-05-20: 5 mg via ORAL
  Filled 2014-05-20: qty 1

## 2014-05-20 MED ORDER — DOXYCYCLINE HYCLATE 100 MG PO CAPS: 100.0000 mg | ORAL_CAPSULE | Freq: Two times a day (BID) | ORAL | Status: DC

## 2014-05-20 NOTE — ED Notes (Signed)
Gave report to Diane at [facility] assisted living facility, informed her pt is ready to be discharge and can they send someone to pick him up.

## 2014-05-20 NOTE — ED Notes (Signed)
MD at bedside. 

## 2014-05-20 NOTE — ED Provider Notes (Signed)
CSN: [id number]     Arrival date & time 05/20/14  1250 History  This chart was scribed for Eric Sorrow, MD by Tula Nakayama, ED Scribe. This patient was seen in room APA18/APA18 and the patient's care was started at 2:08 PM.    Chief Complaint  Patient presents with  . Foot Swelling   Patient is a 63 y.o. male presenting with foot injury. The history is provided by the patient. No language interpreter was used.  Foot Injury Location:  Toe Time since incident:  4 weeks Toe location:  L big toe Pain details:    Quality:  Unable to specify (None)   Radiates to:  Does not radiate   Severity:  No pain Chronicity:  New Dislocation: no   Foreign body present:  No foreign bodies Tetanus status:  Unknown Prior injury to area:  Unable to specify Relieved by:  Nothing Associated symptoms: back pain   Associated symptoms: no fever     HPI Comments: Eric Scott is a 63 y.o. male with a history of DM who presents to the Emergency Department complaining of constant, gradually worsening swelling and redness of his left great toe that started 4-5 weeks ago. Pt has not tried any treatment for his symptoms. Pt states he stays at an assisted living facility where they monitor his blood glucose levels. He denies any pain associated with the injury due to diabetic neuropathy.  Past Medical History  Diagnosis Date  . Hypertension   . Hypercholesteremia   . Type II diabetes mellitus   . Diabetic peripheral neuropathy     "chronic" (05/27/2012)  . Stroke 05/2012    Subacute, lacunar infarcts within the left basal ganglia and posterior limp of the left internal capsule/thalamus; "RUE; both feet weak" (05/27/2012)  . Anxiety   . Spinal stenosis     mild lumbar (MRI 05/2012)-L2-L3 to L4-L5 , mild lumbar foraminal stenosis   . Chronic pain     legs, back; MRI 05/2012 with mild thoracic degenerative changes no spinal stenosis   . Depression   . PVD (peripheral vascular disease)   . Peripheral  edema    Past Surgical History  Procedure Laterality Date  . Hernia repair  01/05/2004    "belly button" (05/27/2012)  . Hernia repair  01/05/2004  . Ankle surgery     History reviewed. No pertinent family history. History  Substance Use Topics  . Smoking status: Current Every Day Smoker -- 2.00 packs/day for 45 years    Types: Cigarettes  . Smokeless tobacco: Never Used  . Alcohol Use: No     Comment: 05/27/2012 "drank gallons and gallons 20 yr ago or so; last drink  at least 10 yr ago"    Review of Systems  Constitutional: Negative for fever and chills.  HENT: Negative for congestion, rhinorrhea and sore throat.   Eyes: Negative for visual disturbance.  Respiratory: Negative for cough and shortness of breath.   Cardiovascular: Positive for leg swelling. Negative for chest pain.  Gastrointestinal: Negative for nausea, vomiting, abdominal pain and diarrhea.  Genitourinary: Negative for dysuria.  Musculoskeletal: Positive for back pain.  Skin: Positive for rash.  Neurological: Negative for headaches.  Hematological: Does not bruise/bleed easily.  Psychiatric/Behavioral: Negative for confusion.   Allergies  Cucumber extract; Flexeril; and Kiwi extract  Home Medications   Prior to Admission medications   Medication Sig Start Date End Date Taking? Authorizing Provider  AMBULATORY NON FORMULARY MEDICATION Oxycontin 10mg  tablet SA Sig: Take one tablet  by mouth every 12 hours for pain. Hold for sedation. Do not crush 05/07/14  Yes Tiffany L Reed, DO  amLODipine (NORVASC) 5 MG tablet Take 2 tablets (10 mg total) by mouth daily. 04/09/14  Yes Kathie Dike, MD  aspirin EC 81 MG tablet Take 81 mg by mouth daily.   Yes Historical Provider, MD  atorvastatin (LIPITOR) 20 MG tablet Take 20 mg by mouth daily.    Yes Historical Provider, MD  calcium-vitamin D (OSCAL WITH D) 500-200 MG-UNIT per tablet Take 1 tablet by mouth 3 (three) times daily.   Yes Historical Provider, MD  citalopram  (CELEXA) 10 MG tablet Take 10 mg by mouth daily.   Yes Historical Provider, MD  diazepam (VALIUM) 10 MG tablet Take one tablet by mouth twice daily as needed for anxiety/agitation 05/06/14  Yes Lauree Chandler, NP  fish oil-omega-3 fatty acids 1000 MG capsule Take 1 g by mouth 2 (two) times daily.   Yes Historical Provider, MD  furosemide (LASIX) 40 MG tablet Take 60 mg by mouth daily.    Yes Historical Provider, MD  gabapentin (NEURONTIN) 800 MG tablet Take 800 mg by mouth 3 (three) times daily.   Yes Historical Provider, MD  glimepiride (AMARYL) 4 MG tablet Take 4 mg by mouth daily.    Yes Historical Provider, MD  ipratropium-albuterol (DUONEB) 0.5-2.5 (3) MG/3ML SOLN Take 3 mLs by nebulization every 6 (six) hours. 04/09/14  Yes Kathie Dike, MD  metFORMIN (GLUCOPHAGE) 500 MG tablet Take 1,000 mg by mouth 2 (two) times daily with a meal.   Yes Historical Provider, MD  mometasone-formoterol (DULERA) 200-5 MCG/ACT AERO Inhale 2 puffs into the lungs 2 (two) times daily. 04/09/14  Yes Kathie Dike, MD  Oxycodone HCl 10 MG TABS Take one tablet by mouth every 12 hours for pain. Hold for sedation. Do not crush 05/06/14  Yes Lauree Chandler, NP  polyethylene glycol [facility]) packet Take 17 g by mouth daily. 04/09/14  Yes Kathie Dike, MD  triamcinolone cream (KENALOG) 0.1 % Apply 1 application topically daily.   Yes Historical Provider, MD  doxycycline (VIBRAMYCIN) 100 MG capsule Take 1 capsule (100 mg total) by mouth 2 (two) times daily. 05/20/14   Eric Sorrow, MD  predniSONE (DELTASONE) 10 MG tablet Take 40 mg po daily for 2 days then 30mg  po daily for 2 days then 20mg  po daily for 2 days then 10mg  po daily for 2 days then stop Patient not taking: Reported on 05/20/2014 04/09/14   Kathie Dike, MD   BP 138/71 mmHg  Pulse 83  Temp(Src) 98.1 F (36.7 C) (Oral)  Resp 16  Ht 6' (1.829 m)  Wt 282 lb (127.914 kg)  BMI 38.24 kg/m2  SpO2 86% Physical Exam  Constitutional: He is oriented to  person, place, and time. He appears well-developed and well-nourished. No distress.  HENT:  Head: Normocephalic and atraumatic.  Eyes: Conjunctivae and EOM are normal.  Neck: Neck supple. No tracheal deviation present.  Cardiovascular: Normal rate, regular rhythm and normal heart sounds.   No murmur heard. Pulmonary/Chest: Effort normal. No respiratory distress. He has no wheezes. He has no rales.  Lungs clear bilaterally  Abdominal: Soft. Bowel sounds are normal. There is no tenderness.  Musculoskeletal: He exhibits edema.  Swelling in both legs; left big toe is red and swollen with increased warmth; redness on top of the left foot; cap refill less than 1 s  Neurological: He is alert and oriented to person, place, and time. No  cranial nerve deficit. He exhibits normal muscle tone. Coordination normal.  Skin: Skin is warm and dry.  Psychiatric: He has a normal mood and affect. His behavior is normal.  Nursing note and vitals reviewed.   ED Course  Procedures (including critical care time) DIAGNOSTIC STUDIES: Oxygen Saturation is 98% on RA, normal by my interpretation.    COORDINATION OF CARE: 2:15 PM Discussed treatment plan with pt which includes lab work and a foot x-ray. Pt agreed to plan.   Labs Review Labs Reviewed  BASIC METABOLIC PANEL - Abnormal; Notable for the following:    Glucose, Bld 102 (*)    GFR calc non Af Amer 70 (*)    GFR calc Af Amer 82 (*)    All other components within normal limits  CBG MONITORING, ED - Abnormal; Notable for the following:    Glucose-Capillary 108 (*)    All other components within normal limits  CBG MONITORING, ED   Results for orders placed or performed during the hospital encounter of [id number]  Basic metabolic panel  Result Value Ref Range   Sodium 142 137 - 147 mEq/L   Potassium 4.3 3.7 - 5.3 mEq/L   Chloride 102 96 - 112 mEq/L   CO2 29 19 - 32 mEq/L   Glucose, Bld 102 (H) 70 - 99 mg/dL   BUN 16 6 - 23 mg/dL   Creatinine,  Ser 1.09 0.50 - 1.35 mg/dL   Calcium 9.5 8.4 - 10.5 mg/dL   GFR calc non Af Amer 70 (L) >90 mL/min   GFR calc Af Amer 82 (L) >90 mL/min   Anion gap 11 5 - 15  CBG monitoring, ED  Result Value Ref Range   Glucose-Capillary 108 (H) 70 - 99 mg/dL     Imaging Review Mr Foot Left Wo Contrast  05/20/2014   CLINICAL DATA:  Redness and swelling of the great toe for several weeks. Severe pain.  EXAM: MRI OF THE LEFT FOREFOOT WITHOUT CONTRAST  TECHNIQUE: Multiplanar, multisequence MR imaging was performed. No intravenous contrast was administered.  COMPARISON:  Radiographs dated 05/20/2014  FINDINGS: The exam is severely degraded by motion artifact. However, the study is diagnostic regarding the absence of osteomyelitis of the great toe. There is no abnormal signal from the metatarsals or from the phalangeal bones of the toes. There is edema in the subcutaneous soft tissues of all of the toes and over the dorsum of the first metatarsal. The muscles and tendons appear normal. No joint effusions.  IMPRESSION: No evidence of osteomyelitis. Soft tissue edema is nonspecific but is consistent with cellulitis.   Electronically Signed   By: Rozetta Nunnery M.D.   On: 05/20/2014 19:28   Dg Foot Complete Left  05/20/2014   CLINICAL DATA:  Left foot pain and swelling  EXAM: LEFT FOOT - COMPLETE 3+ VIEW  COMPARISON:  02/17/2013  FINDINGS: Generalized osteopenia is noted. Degenerative changes are noted at the first MTP joint. Postsurgical changes are noted in the distal tibia and fibula similar to that seen on prior exam. Generalized soft tissue swelling is seen. No definitive bony erosion to suggest osteomyelitis is noted.  IMPRESSION: Chronic bony changes without acute abnormality.  Generalized soft tissue swelling.   Electronically Signed   By: Inez Catalina M.D.   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 Findings represent cellulitis of the left great toe in a low bit of the distal forefoot. Will treat with oral antibiotics. No significant electrolyte abnormalities. Patient is not febrile. Patient will return if not improving in a few days. Patient started on doxycycline here as the first dose.    I personally performed the services described in this documentation, which was scribed in my presence. The recorded information has been reviewed and is accurate.     Eric Sorrow, MD 05/20/14 2010

## 2014-05-20 NOTE — ED Notes (Signed)
Patient c/o swelling and redness to left great toe and foot x2 weeks. Left great toe redness, swelling, and warm to touch. Patient denies any pain due to diabetic neuropathy. Unsure of any injury or fevers.

## 2014-05-20 NOTE — Discharge Instructions (Signed)
No evidence of bone infection to the left foot or toe. MRI was negative. Clinically there was concern for osteomyelitis but it is not present. Findings represent a cellulitis. Take the doxycycline 100 mg twice a day for the next 7 days. Make a point to follow-up with your doctor in the next few days. Return for any new or worse symptoms.

## 2014-07-20 ENCOUNTER — Encounter (HOSPITAL_COMMUNITY): Payer: Self-pay | Admitting: Emergency Medicine

## 2014-07-20 ENCOUNTER — Other Ambulatory Visit: Payer: Self-pay

## 2014-07-20 ENCOUNTER — Emergency Department (HOSPITAL_COMMUNITY): Payer: Medicare Other

## 2014-07-20 ENCOUNTER — Inpatient Hospital Stay (HOSPITAL_COMMUNITY)
Admission: EM | Admit: 2014-07-20 | Discharge: 2014-07-26 | DRG: 193 | Disposition: A | Discharge status: Home Health | Payer: Medicare Other | Attending: Family Medicine | Admitting: Family Medicine

## 2014-07-20 DIAGNOSIS — Z800 Family history of malignant neoplasm of digestive organs: Secondary | ICD-10-CM | POA: Diagnosis not present

## 2014-07-20 DIAGNOSIS — I739 Peripheral vascular disease, unspecified: Secondary | ICD-10-CM | POA: Diagnosis present

## 2014-07-20 DIAGNOSIS — I10 Essential (primary) hypertension: Secondary | ICD-10-CM | POA: Diagnosis present

## 2014-07-20 DIAGNOSIS — J189 Pneumonia, unspecified organism: Principal | ICD-10-CM

## 2014-07-20 DIAGNOSIS — Z6838 Body mass index (BMI) 38.0-38.9, adult: Secondary | ICD-10-CM

## 2014-07-20 DIAGNOSIS — E669 Obesity, unspecified: Secondary | ICD-10-CM | POA: Diagnosis present

## 2014-07-20 DIAGNOSIS — Y95 Nosocomial condition: Secondary | ICD-10-CM | POA: Diagnosis present

## 2014-07-20 DIAGNOSIS — Z833 Family history of diabetes mellitus: Secondary | ICD-10-CM

## 2014-07-20 DIAGNOSIS — R911 Solitary pulmonary nodule: Secondary | ICD-10-CM | POA: Diagnosis present

## 2014-07-20 DIAGNOSIS — Z7951 Long term (current) use of inhaled steroids: Secondary | ICD-10-CM | POA: Diagnosis not present

## 2014-07-20 DIAGNOSIS — J449 Chronic obstructive pulmonary disease, unspecified: Secondary | ICD-10-CM | POA: Diagnosis present

## 2014-07-20 DIAGNOSIS — R0902 Hypoxemia: Secondary | ICD-10-CM

## 2014-07-20 DIAGNOSIS — I5033 Acute on chronic diastolic (congestive) heart failure: Secondary | ICD-10-CM | POA: Diagnosis present

## 2014-07-20 DIAGNOSIS — M25562 Pain in left knee: Secondary | ICD-10-CM | POA: Diagnosis present

## 2014-07-20 DIAGNOSIS — Z7982 Long term (current) use of aspirin: Secondary | ICD-10-CM

## 2014-07-20 DIAGNOSIS — Z8673 Personal history of transient ischemic attack (TIA), and cerebral infarction without residual deficits: Secondary | ICD-10-CM | POA: Diagnosis not present

## 2014-07-20 DIAGNOSIS — E780 Pure hypercholesterolemia: Secondary | ICD-10-CM | POA: Diagnosis present

## 2014-07-20 DIAGNOSIS — Z79891 Long term (current) use of opiate analgesic: Secondary | ICD-10-CM | POA: Diagnosis not present

## 2014-07-20 DIAGNOSIS — I5032 Chronic diastolic (congestive) heart failure: Secondary | ICD-10-CM

## 2014-07-20 DIAGNOSIS — N184 Chronic kidney disease, stage 4 (severe): Secondary | ICD-10-CM

## 2014-07-20 DIAGNOSIS — R0602 Shortness of breath: Secondary | ICD-10-CM | POA: Diagnosis not present

## 2014-07-20 DIAGNOSIS — N281 Cyst of kidney, acquired: Secondary | ICD-10-CM | POA: Diagnosis present

## 2014-07-20 DIAGNOSIS — F17210 Nicotine dependence, cigarettes, uncomplicated: Secondary | ICD-10-CM | POA: Diagnosis present

## 2014-07-20 DIAGNOSIS — E1122 Type 2 diabetes mellitus with diabetic chronic kidney disease: Secondary | ICD-10-CM

## 2014-07-20 DIAGNOSIS — E118 Type 2 diabetes mellitus with unspecified complications: Secondary | ICD-10-CM

## 2014-07-20 DIAGNOSIS — E1142 Type 2 diabetes mellitus with diabetic polyneuropathy: Secondary | ICD-10-CM

## 2014-07-20 DIAGNOSIS — F419 Anxiety disorder, unspecified: Secondary | ICD-10-CM | POA: Diagnosis present

## 2014-07-20 DIAGNOSIS — G8929 Other chronic pain: Secondary | ICD-10-CM | POA: Diagnosis present

## 2014-07-20 DIAGNOSIS — F17200 Nicotine dependence, unspecified, uncomplicated: Secondary | ICD-10-CM | POA: Diagnosis present

## 2014-07-20 DIAGNOSIS — J9601 Acute respiratory failure with hypoxia: Secondary | ICD-10-CM | POA: Diagnosis present

## 2014-07-20 DIAGNOSIS — E119 Type 2 diabetes mellitus without complications: Secondary | ICD-10-CM

## 2014-07-20 HISTORY — DX: Tremor, unspecified: R25.1

## 2014-07-20 HISTORY — DX: Chronic obstructive pulmonary disease, unspecified: J44.9

## 2014-07-20 LAB — COMPREHENSIVE METABOLIC PANEL
ALT: 14 U/L (ref 0–53)
AST: 14 U/L (ref 0–37)
Albumin: 3.8 g/dL (ref 3.5–5.2)
Alkaline Phosphatase: 51 U/L (ref 39–117)
Anion gap: 8 (ref 5–15)
BUN: 25 mg/dL — ABNORMAL HIGH (ref 6–23)
CHLORIDE: 104 mmol/L (ref 96–112)
CO2: 26 mmol/L (ref 19–32)
Calcium: 9 mg/dL (ref 8.4–10.5)
Creatinine, Ser: 1.19 mg/dL (ref 0.50–1.35)
GFR calc Af Amer: 73 mL/min — ABNORMAL LOW (ref >90)
GFR calc non Af Amer: 63 mL/min — ABNORMAL LOW (ref >90)
Glucose, Bld: 149 mg/dL — ABNORMAL HIGH (ref 70–99)
Potassium: 3.7 mmol/L (ref 3.5–5.1)
SODIUM: 138 mmol/L (ref 135–145)
TOTAL PROTEIN: 7.1 g/dL (ref 6.0–8.3)
Total Bilirubin: 1.1 mg/dL (ref 0.3–1.2)

## 2014-07-20 LAB — LACTIC ACID, PLASMA
LACTIC ACID, VENOUS: 2.6 mmol/L — AB (ref 0.5–2.0)
Lactic Acid, Venous: 1 mmol/L (ref 0.5–2.0)

## 2014-07-20 LAB — URINALYSIS, ROUTINE W REFLEX MICROSCOPIC
BILIRUBIN URINE: NEGATIVE
GLUCOSE, UA: NEGATIVE mg/dL
Ketones, ur: NEGATIVE mg/dL
Leukocytes, UA: NEGATIVE
NITRITE: NEGATIVE
PH: 6.5 (ref 5.0–8.0)
Protein, ur: 30 mg/dL — AB
SPECIFIC GRAVITY, URINE: 1.015 (ref 1.005–1.030)
Urobilinogen, UA: 0.2 mg/dL (ref 0.0–1.0)

## 2014-07-20 LAB — CBC WITH DIFFERENTIAL/PLATELET
BASOS ABS: 0 10*3/uL (ref 0.0–0.1)
Basophils Relative: 0 % (ref 0–1)
Eosinophils Absolute: 0 10*3/uL (ref 0.0–0.7)
Eosinophils Relative: 0 % (ref 0–5)
HCT: 40.9 % (ref 39.0–52.0)
Hemoglobin: 13.6 g/dL (ref 13.0–17.0)
LYMPHS ABS: 1.1 10*3/uL (ref 0.7–4.0)
LYMPHS PCT: 6 % — AB (ref 12–46)
MCH: 32.2 pg (ref 26.0–34.0)
MCHC: 33.3 g/dL (ref 30.0–36.0)
MCV: 96.7 fL (ref 78.0–100.0)
Monocytes Absolute: 1.1 10*3/uL — ABNORMAL HIGH (ref 0.1–1.0)
Monocytes Relative: 5 % (ref 3–12)
NEUTROS ABS: 18.2 10*3/uL — AB (ref 1.7–7.7)
NEUTROS PCT: 89 % — AB (ref 43–77)
PLATELETS: 191 10*3/uL (ref 150–400)
RBC: 4.23 MIL/uL (ref 4.22–5.81)
RDW: 14.8 % (ref 11.5–15.5)
WBC: 20.5 10*3/uL — AB (ref 4.0–10.5)

## 2014-07-20 LAB — BRAIN NATRIURETIC PEPTIDE: B Natriuretic Peptide: 147 pg/mL — ABNORMAL HIGH (ref 0.0–100.0)

## 2014-07-20 LAB — URINE MICROSCOPIC-ADD ON

## 2014-07-20 LAB — GLUCOSE, CAPILLARY: GLUCOSE-CAPILLARY: 230 mg/dL — AB (ref 70–99)

## 2014-07-20 LAB — TROPONIN I: Troponin I: <0.03 ng/mL (ref <0.031)

## 2014-07-20 MED ORDER — FUROSEMIDE 10 MG/ML IJ SOLN
40.0000 mg | Freq: Once | INTRAMUSCULAR | Status: AC
  Administered 2014-07-20: 40 mg via INTRAVENOUS
  Filled 2014-07-20: qty 4

## 2014-07-20 MED ORDER — VANCOMYCIN HCL IN DEXTROSE 1-5 GM/200ML-% IV SOLN
1000.0000 mg | Freq: Once | INTRAVENOUS | Status: AC
  Administered 2014-07-20: 1000 mg via INTRAVENOUS
  Filled 2014-07-20: qty 200

## 2014-07-20 MED ORDER — ACETAMINOPHEN 325 MG PO TABS: 650.0000 mg | ORAL_TABLET | Freq: Four times a day (QID) | ORAL | Status: DC | PRN

## 2014-07-20 MED ORDER — SODIUM CHLORIDE 0.9 % IJ SOLN
3.0000 mL | Freq: Two times a day (BID) | INTRAMUSCULAR | Status: DC
  Administered 2014-07-20 – 2014-07-25 (×8): 3 mL via INTRAVENOUS

## 2014-07-20 MED ORDER — DIAZEPAM 5 MG PO TABS
10.0000 mg | ORAL_TABLET | Freq: Once | ORAL | Status: AC
  Administered 2014-07-20: 10 mg via ORAL
  Filled 2014-07-20: qty 2

## 2014-07-20 MED ORDER — IPRATROPIUM-ALBUTEROL 0.5-2.5 (3) MG/3ML IN SOLN
3.0000 mL | Freq: Four times a day (QID) | RESPIRATORY_TRACT | Status: DC
  Administered 2014-07-20: 3 mL via RESPIRATORY_TRACT
  Filled 2014-07-20: qty 3

## 2014-07-20 MED ORDER — VANCOMYCIN HCL IN DEXTROSE 1-5 GM/200ML-% IV SOLN: 1000.0000 mg | Freq: Once | INTRAVENOUS | Status: DC

## 2014-07-20 MED ORDER — ASPIRIN EC 81 MG PO TBEC
81.0000 mg | DELAYED_RELEASE_TABLET | Freq: Every day | ORAL | Status: DC
  Administered 2014-07-21 – 2014-07-26 (×6): 81 mg via ORAL
  Filled 2014-07-20 (×6): qty 1

## 2014-07-20 MED ORDER — ONDANSETRON HCL 4 MG/2ML IJ SOLN: 4.0000 mg | Freq: Four times a day (QID) | INTRAMUSCULAR | Status: DC | PRN

## 2014-07-20 MED ORDER — DIAZEPAM 5 MG PO TABS
10.0000 mg | ORAL_TABLET | Freq: Two times a day (BID) | ORAL | Status: DC | PRN
  Filled 2014-07-20: qty 2

## 2014-07-20 MED ORDER — FUROSEMIDE 10 MG/ML IJ SOLN
20.0000 mg | Freq: Two times a day (BID) | INTRAMUSCULAR | Status: DC
  Administered 2014-07-21 – 2014-07-23 (×5): 20 mg via INTRAVENOUS
  Filled 2014-07-20 (×6): qty 2

## 2014-07-20 MED ORDER — IPRATROPIUM BROMIDE 0.02 % IN SOLN: 0.5000 mg | Freq: Four times a day (QID) | RESPIRATORY_TRACT | Status: DC

## 2014-07-20 MED ORDER — ENOXAPARIN SODIUM 80 MG/0.8ML ~~LOC~~ SOLN
70.0000 mg | SUBCUTANEOUS | Status: DC
  Administered 2014-07-20 – 2014-07-22 (×3): 70 mg via SUBCUTANEOUS
  Filled 2014-07-20 (×3): qty 0.8

## 2014-07-20 MED ORDER — IPRATROPIUM BROMIDE 0.02 % IN SOLN
1.0000 mg | Freq: Once | RESPIRATORY_TRACT | Status: AC
  Administered 2014-07-20: 1 mg via RESPIRATORY_TRACT
  Filled 2014-07-20: qty 5

## 2014-07-20 MED ORDER — NITROGLYCERIN 2 % TD OINT
1.0000 [in_us] | TOPICAL_OINTMENT | Freq: Once | TRANSDERMAL | Status: AC
  Administered 2014-07-20: 1 [in_us] via TOPICAL
  Filled 2014-07-20: qty 1

## 2014-07-20 MED ORDER — GUAIFENESIN ER 600 MG PO TB12
1200.0000 mg | ORAL_TABLET | Freq: Two times a day (BID) | ORAL | Status: DC
  Administered 2014-07-20 – 2014-07-26 (×12): 1200 mg via ORAL
  Filled 2014-07-20 (×12): qty 2

## 2014-07-20 MED ORDER — SODIUM CHLORIDE 0.9 % IJ SOLN
3.0000 mL | Freq: Two times a day (BID) | INTRAMUSCULAR | Status: DC
  Administered 2014-07-20 – 2014-07-26 (×5): 3 mL via INTRAVENOUS

## 2014-07-20 MED ORDER — IPRATROPIUM-ALBUTEROL 0.5-2.5 (3) MG/3ML IN SOLN
3.0000 mL | Freq: Four times a day (QID) | RESPIRATORY_TRACT | Status: DC
  Administered 2014-07-21: 3 mL via RESPIRATORY_TRACT
  Filled 2014-07-20: qty 3

## 2014-07-20 MED ORDER — INSULIN ASPART 100 UNIT/ML ~~LOC~~ SOLN
0.0000 [IU] | Freq: Three times a day (TID) | SUBCUTANEOUS | Status: DC
  Administered 2014-07-21 (×2): 1 [IU] via SUBCUTANEOUS
  Administered 2014-07-21: 2 [IU] via SUBCUTANEOUS
  Administered 2014-07-22: 1 [IU] via SUBCUTANEOUS
  Administered 2014-07-22: 2 [IU] via SUBCUTANEOUS
  Administered 2014-07-22: 3 [IU] via SUBCUTANEOUS
  Administered 2014-07-23: 1 [IU] via SUBCUTANEOUS
  Administered 2014-07-23: 2 [IU] via SUBCUTANEOUS
  Administered 2014-07-23: 3 [IU] via SUBCUTANEOUS
  Administered 2014-07-24 (×2): 2 [IU] via SUBCUTANEOUS
  Administered 2014-07-24: 1 [IU] via SUBCUTANEOUS

## 2014-07-20 MED ORDER — ALBUTEROL SULFATE (2.5 MG/3ML) 0.083% IN NEBU: 2.5000 mg | INHALATION_SOLUTION | RESPIRATORY_TRACT | Status: DC

## 2014-07-20 MED ORDER — AMLODIPINE BESYLATE 5 MG PO TABS
10.0000 mg | ORAL_TABLET | Freq: Every day | ORAL | Status: DC
  Administered 2014-07-21 – 2014-07-26 (×6): 10 mg via ORAL
  Filled 2014-07-20 (×7): qty 2

## 2014-07-20 MED ORDER — VANCOMYCIN HCL IN DEXTROSE 1-5 GM/200ML-% IV SOLN
INTRAVENOUS | Status: AC
  Filled 2014-07-20: qty 200

## 2014-07-20 MED ORDER — DIAZEPAM 5 MG PO TABS: 10.0000 mg | ORAL_TABLET | Freq: Two times a day (BID) | ORAL | Status: DC | PRN

## 2014-07-20 MED ORDER — ALBUTEROL (5 MG/ML) CONTINUOUS INHALATION SOLN
10.0000 mg/h | INHALATION_SOLUTION | Freq: Once | RESPIRATORY_TRACT | Status: AC
  Administered 2014-07-20: 10 mg/h via RESPIRATORY_TRACT
  Filled 2014-07-20: qty 20

## 2014-07-20 MED ORDER — CITALOPRAM HYDROBROMIDE 20 MG PO TABS
20.0000 mg | ORAL_TABLET | Freq: Every day | ORAL | Status: DC
Start: 2014-07-21 — End: 2014-07-26
  Administered 2014-07-21 – 2014-07-26 (×6): 20 mg via ORAL
  Filled 2014-07-20 (×6): qty 1

## 2014-07-20 MED ORDER — DEXTROSE 5 % IV SOLN
1.0000 g | Freq: Once | INTRAVENOUS | Status: AC
  Administered 2014-07-20: 1 g via INTRAVENOUS
  Filled 2014-07-20: qty 1

## 2014-07-20 MED ORDER — GABAPENTIN 400 MG PO CAPS
800.0000 mg | ORAL_CAPSULE | Freq: Three times a day (TID) | ORAL | Status: DC
  Administered 2014-07-20 – 2014-07-26 (×16): 800 mg via ORAL
  Filled 2014-07-20 (×16): qty 2

## 2014-07-20 MED ORDER — DEXTROSE 5 % IV SOLN
2.0000 g | Freq: Three times a day (TID) | INTRAVENOUS | Status: DC
  Administered 2014-07-21 – 2014-07-26 (×17): 2 g via INTRAVENOUS
  Filled 2014-07-20 (×20): qty 2

## 2014-07-20 MED ORDER — VANCOMYCIN HCL IN DEXTROSE 1-5 GM/200ML-% IV SOLN
1000.0000 mg | Freq: Two times a day (BID) | INTRAVENOUS | Status: DC
  Administered 2014-07-21 – 2014-07-22 (×4): 1000 mg via INTRAVENOUS
  Filled 2014-07-20 (×11): qty 200

## 2014-07-20 MED ORDER — ONDANSETRON HCL 4 MG PO TABS: 4.0000 mg | ORAL_TABLET | Freq: Four times a day (QID) | ORAL | Status: DC | PRN

## 2014-07-20 MED ORDER — CETYLPYRIDINIUM CHLORIDE 0.05 % MT LIQD
7.0000 mL | Freq: Two times a day (BID) | OROMUCOSAL | Status: DC
  Administered 2014-07-20 – 2014-07-26 (×12): 7 mL via OROMUCOSAL

## 2014-07-20 MED ORDER — ACETAMINOPHEN 650 MG RE SUPP: 650.0000 mg | Freq: Four times a day (QID) | RECTAL | Status: DC | PRN

## 2014-07-20 MED ORDER — SODIUM CHLORIDE 0.9 % IJ SOLN
3.0000 mL | INTRAMUSCULAR | Status: DC | PRN
  Administered 2014-07-25: 3 mL via INTRAVENOUS
  Filled 2014-07-20: qty 3

## 2014-07-20 MED ORDER — SODIUM CHLORIDE 0.9 % IV SOLN: 250.0000 mL | INTRAVENOUS | Status: DC | PRN

## 2014-07-20 NOTE — ED Notes (Signed)
Per [place], patient received nebulizer treatment at 1400 today.

## 2014-07-20 NOTE — Progress Notes (Signed)
Pharmacy Note:  Initial antibiotics for Vancomycin and Cefepime ordered by EDP for PNA.  Estimated Creatinine Clearance: 87.2 mL/min (by C-G formula based on Cr of 1.19).   Allergies  Allergen Reactions  . Cucumber Extract Other (See Comments)    "Fells like I'm having a heart attack"  . Flexeril [Cyclobenzaprine Hcl] Other (See Comments)    "whole body  Tremors" (05/27/2012)  . Kiwi Extract Other (See Comments)    "feels like I'm having a heart attack"    Filed Vitals:   07/20/14 1845  BP:   Pulse: 103  Temp:   Resp: 17    Anti-infectives    Start     Dose/Rate Route Frequency Ordered Stop   07/20/14 1900  ceFEPIme (MAXIPIME) 1 g in dextrose 5 % 50 mL IVPB     1 g 100 mL/hr over 30 Minutes Intravenous  Once 07/20/14 1847        Plan: Initial dose of Vancomycin 2000gm(total) X 1 ordered. F/U admission orders for further dosing if therapy continued.  Pricilla Larsson, [facility] 07/20/2014 7:13 PM

## 2014-07-20 NOTE — ED Provider Notes (Signed)
CSN: [id number]     Arrival date & time 07/20/14  1446 History   First MD Initiated Contact with Patient 07/20/14 1524     Chief Complaint  Patient presents with  . Shortness of Breath      HPI Pt was seen at 1535.  Per pt and NH report ([facility]), c/o gradual onset and worsening of persistent cough, wheezing and SOB for the past 1 to 2 days. Has been associated with generalized weakness/fatigue. Has been using nebs without relief. Pt continues to smoke cigarettes. Denies CP/palpitations, no back pain, no abd pain, no N/V/D, no fevers, no rash.     Past Medical History  Diagnosis Date  . Hypertension   . Hypercholesteremia   . Type II diabetes mellitus   . Diabetic peripheral neuropathy     "chronic" (05/27/2012)  . Stroke 05/2012    Subacute, lacunar infarcts within the left basal ganglia and posterior limp of the left internal capsule/thalamus; "RUE; both feet weak" (05/27/2012)  . Anxiety   . Spinal stenosis     mild lumbar (MRI 05/2012)-L2-L3 to L4-L5 , mild lumbar foraminal stenosis   . Chronic pain     legs, back; MRI 05/2012 with mild thoracic degenerative changes no spinal stenosis   . Depression   . PVD (peripheral vascular disease)   . Peripheral edema   . CHF (congestive heart failure)   . COPD (chronic obstructive pulmonary disease)   . Tremor    Past Surgical History  Procedure Laterality Date  . Hernia repair  01/05/2004    "belly button" (05/27/2012)  . Hernia repair  01/05/2004  . Ankle surgery      History  Substance Use Topics  . Smoking status: Current Every Day Smoker -- 0.50 packs/day for 45 years    Types: Cigarettes  . Smokeless tobacco: Never Used  . Alcohol Use: No     Comment: 05/27/2012 "drank gallons and gallons 20 yr ago or so; last drink  at least 10 yr ago"    Review of Systems ROS: Statement: All systems negative except as marked or noted in the HPI; Constitutional: Negative for fever and chills. +generalized weakness/fatigue.; ;  Eyes: Negative for eye pain, redness and discharge. ; ; ENMT: Negative for ear pain, hoarseness, nasal congestion, sinus pressure and sore throat. ; ; Cardiovascular: Negative for chest pain, palpitations, diaphoresis, and peripheral edema. ; ; Respiratory: +SOB, cough, wheezing. Negative for stridor. ; ; Gastrointestinal: Negative for nausea, vomiting, diarrhea, abdominal pain, blood in stool, hematemesis, jaundice and rectal bleeding. . ; ; Genitourinary: Negative for dysuria, flank pain and hematuria. ; ; Musculoskeletal: Negative for back pain and neck pain. Negative for swelling and trauma.; ; Skin: Negative for pruritus, rash, abrasions, blisters, bruising and skin lesion.; ; Neuro: Negative for headache, lightheadedness and neck stiffness. Negative for altered level of consciousness , altered mental status, extremity weakness, paresthesias, involuntary movement, seizure and syncope.       Allergies  Cucumber extract; Flexeril; and Kiwi extract  Home Medications   Prior to Admission medications   Medication Sig Start Date End Date Taking? Authorizing Provider  AMBULATORY NON FORMULARY MEDICATION Oxycontin 10mg  tablet SA Sig: Take one tablet by mouth every 12 hours for pain. Hold for sedation. Do not crush 05/07/14   Tiffany L Reed, DO  amLODipine (NORVASC) 5 MG tablet Take 2 tablets (10 mg total) by mouth daily. 04/09/14   Kathie Dike, MD  aspirin EC 81 MG tablet Take 81 mg by mouth daily.  Historical Provider, MD  atorvastatin (LIPITOR) 20 MG tablet Take 20 mg by mouth daily.     Historical Provider, MD  calcium-vitamin D (OSCAL WITH D) 500-200 MG-UNIT per tablet Take 1 tablet by mouth 3 (three) times daily.    Historical Provider, MD  citalopram (CELEXA) 10 MG tablet Take 10 mg by mouth daily.    Historical Provider, MD  diazepam (VALIUM) 10 MG tablet Take one tablet by mouth twice daily as needed for anxiety/agitation 05/06/14   Lauree Chandler, NP  doxycycline (VIBRAMYCIN) 100 MG  capsule Take 1 capsule (100 mg total) by mouth 2 (two) times daily. 05/20/14   Fredia Sorrow, MD  fish oil-omega-3 fatty acids 1000 MG capsule Take 1 g by mouth 2 (two) times daily.    Historical Provider, MD  furosemide (LASIX) 40 MG tablet Take 60 mg by mouth daily.     Historical Provider, MD  gabapentin (NEURONTIN) 800 MG tablet Take 800 mg by mouth 3 (three) times daily.    Historical Provider, MD  glimepiride (AMARYL) 4 MG tablet Take 4 mg by mouth daily.     Historical Provider, MD  ipratropium-albuterol (DUONEB) 0.5-2.5 (3) MG/3ML SOLN Take 3 mLs by nebulization every 6 (six) hours. 04/09/14   Kathie Dike, MD  metFORMIN (GLUCOPHAGE) 500 MG tablet Take 1,000 mg by mouth 2 (two) times daily with a meal.    Historical Provider, MD  mometasone-formoterol (DULERA) 200-5 MCG/ACT AERO Inhale 2 puffs into the lungs 2 (two) times daily. 04/09/14   Kathie Dike, MD  Oxycodone HCl 10 MG TABS Take one tablet by mouth every 12 hours for pain. Hold for sedation. Do not crush 05/06/14   Lauree Chandler, NP  polyethylene glycol [facility]) packet Take 17 g by mouth daily. 04/09/14   Kathie Dike, MD  predniSONE (DELTASONE) 10 MG tablet Take 40 mg po daily for 2 days then 30mg  po daily for 2 days then 20mg  po daily for 2 days then 10mg  po daily for 2 days then stop Patient not taking: Reported on 05/20/2014 04/09/14   Kathie Dike, MD  triamcinolone cream (KENALOG) 0.1 % Apply 1 application topically daily.    Historical Provider, MD   BP 111/56 mmHg  Pulse 83  Temp(Src) 100.5 F (38.1 C) (Rectal)  Resp 16  Ht 6' (1.829 m)  Wt 285 lb (129.275 kg)  BMI 38.64 kg/m2  SpO2 91% Physical Exam  1540: Physical examination:  Nursing notes reviewed; Vital signs and O2 SAT reviewed;  Constitutional: Well developed, Well nourished, Well hydrated, In no acute distress; Head:  Normocephalic, atraumatic; Eyes: EOMI, PERRL, No scleral icterus; ENMT: Mouth and pharynx normal, Mucous membranes moist; Neck:  Supple, Full range of motion, No lymphadenopathy; Cardiovascular: Regular rate and rhythm, No gallop; Respiratory: Breath sounds diminished & equal bilaterally, scattered wheezes. Sitting upright. Speaking full sentences, Normal respiratory effort/excursion; Chest: Nontender, Movement normal; Abdomen: Soft, Nontender, Nondistended, Normal bowel sounds; Genitourinary: No CVA tenderness; Extremities: Pulses normal, No tenderness, +2 pedal edema bilat. No calf asymmetry.; Neuro: AA&Ox3, Major CN grossly intact.  Speech clear. No gross focal motor or sensory deficits in extremities.; Skin: Color normal, Warm, Dry.     ED Course  Procedures     EKG Interpretation   Date/Time:  Tuesday July 20 2014 15:13:05 EST Ventricular Rate:  93 PR Interval:  160 QRS Duration: 98 QT Interval:  350 QTC Calculation: 435 R Axis:   -20 Text Interpretation:  Normal sinus rhythm Low voltage QRS Left axis  deviation Nonspecific ST abnormality Abnormal ECG When compared with ECG  of 04/02/2014 Nonspecific ST and T wave abnormality is now Present  Confirmed by [facility]  MD, Nunzio Cory [phone]) on 07/20/2014 4:02:13 PM      MDM  MDM Reviewed: previous chart, nursing note and vitals Reviewed previous: labs and ECG Interpretation: labs, ECG, x-ray and CT scan Total time providing critical care: 30-74 minutes. This excludes time spent performing separately reportable procedures and services. Consults: admitting MD   CRITICAL CARE Performed by: Alfonzo Feller Total critical care time: 35 Critical care time was exclusive of separately billable procedures and treating other patients. Critical care was necessary to treat or prevent imminent or life-threatening deterioration. Critical care was time spent personally by me on the following activities: development of treatment plan with patient and/or surrogate as well as nursing, discussions with consultants, evaluation of patient's response to treatment,  examination of patient, obtaining history from patient or surrogate, ordering and performing treatments and interventions, ordering and review of laboratory studies, ordering and review of radiographic studies, pulse oximetry and re-evaluation of patient's condition.    Results for orders placed or performed during the hospital encounter of 07/20/14  CBC with Differential  Result Value Ref Range   WBC 20.5 (H) 4.0 - 10.5 K/uL   RBC 4.23 4.22 - 5.81 MIL/uL   Hemoglobin 13.6 13.0 - 17.0 g/dL   HCT 40.9 39.0 - 52.0 %   MCV 96.7 78.0 - 100.0 fL   MCH 32.2 26.0 - 34.0 pg   MCHC 33.3 30.0 - 36.0 g/dL   RDW 14.8 11.5 - 15.5 %   Platelets 191 150 - 400 K/uL   Neutrophils Relative % 89 (H) 43 - 77 %   Neutro Abs 18.2 (H) 1.7 - 7.7 K/uL   Lymphocytes Relative 6 (L) 12 - 46 %   Lymphs Abs 1.1 0.7 - 4.0 K/uL   Monocytes Relative 5 3 - 12 %   Monocytes Absolute 1.1 (H) 0.1 - 1.0 K/uL   Eosinophils Relative 0 0 - 5 %   Eosinophils Absolute 0.0 0.0 - 0.7 K/uL   Basophils Relative 0 0 - 1 %   Basophils Absolute 0.0 0.0 - 0.1 K/uL  Comprehensive metabolic panel  Result Value Ref Range   Sodium 138 135 - 145 mmol/L   Potassium 3.7 3.5 - 5.1 mmol/L   Chloride 104 96 - 112 mmol/L   CO2 26 19 - 32 mmol/L   Glucose, Bld 149 (H) 70 - 99 mg/dL   BUN 25 (H) 6 - 23 mg/dL   Creatinine, Ser 1.19 0.50 - 1.35 mg/dL   Calcium 9.0 8.4 - 10.5 mg/dL   Total Protein 7.1 6.0 - 8.3 g/dL   Albumin 3.8 3.5 - 5.2 g/dL   AST 14 0 - 37 U/L   ALT 14 0 - 53 U/L   Alkaline Phosphatase 51 39 - 117 U/L   Total Bilirubin 1.1 0.3 - 1.2 mg/dL   GFR calc non Af Amer 63 (L) >90 mL/min   GFR calc Af Amer 73 (L) >90 mL/min   Anion gap 8 5 - 15  Troponin I  Result Value Ref Range   Troponin I <0.03 <0.031 ng/mL  Brain natriuretic peptide  Result Value Ref Range   B Natriuretic Peptide 147.0 (H) 0.0 - 100.0 pg/mL  Lactic acid, plasma  Result Value Ref Range   Lactic Acid, Venous 1.0 0.5 - 2.0 mmol/L  Urinalysis,  Routine w reflex microscopic  Result Value  Ref Range   Color, Urine YELLOW YELLOW   APPearance CLEAR CLEAR   Specific Gravity, Urine 1.015 1.005 - 1.030   pH 6.5 5.0 - 8.0   Glucose, UA NEGATIVE NEGATIVE mg/dL   Hgb urine dipstick TRACE (A) NEGATIVE   Bilirubin Urine NEGATIVE NEGATIVE   Ketones, ur NEGATIVE NEGATIVE mg/dL   Protein, ur 30 (A) NEGATIVE mg/dL   Urobilinogen, UA 0.2 0.0 - 1.0 mg/dL   Nitrite NEGATIVE NEGATIVE   Leukocytes, UA NEGATIVE NEGATIVE  Urine microscopic-add on  Result Value Ref Range   Squamous Epithelial / LPF RARE RARE   WBC, UA 0-2 <3 WBC/hpf   RBC / HPF 0-2 <3 RBC/hpf   Bacteria, UA RARE RARE   Ct Chest Wo Contrast 07/20/2014   CLINICAL DATA:  Further evaluation of pulmonary nodular opacity seen on prior chest radiographs  EXAM: CT CHEST WITHOUT CONTRAST  TECHNIQUE: Multidetector CT imaging of the chest was performed following the standard protocol without IV contrast.  COMPARISON:  MRI thoracic spine 05/29/2012, chest radiograph performed 05/27/2012, 02/05/2013, 04/03/2014, and 07/20/2014  FINDINGS: 12 mm right perihilar circumscribed round mass identified. There is central low attenuation with average attenuation value of -46 in this area. No calcification. This mass is relatively low-attenuation on chest radiographs but can be seen dating back to 2013. There are no other lung nodules.  There is infiltrate in the deep tendon portion of the right middle lobe. There is consolidation in the inferior medial right middle lobe with air bronchograms. There is consolidation in the posterior medial right lower lobe. There is consolidation in the inferior lingula with air bronchograms and surrounding patchy infiltrate. There is consolidation with air bronchograms in the dependent portion of the left lower lobe. There is also surrounding patchy multinodular infiltrate anterior to the consolidation in the left lower lobe.  No plural effusion. Minimal pericardial thickening. Mild  to moderate cardiac enlargement noted. Mild coronary artery calcification.  Mild calcification of the thoracic aorta. Thoracic inlet and thyroid appear normal. Small nonpathologic mediastinal lymph nodes present.  Bilateral adrenal hyperplasia stable from 07/20/2010. Fat containing area in the posterior adrenal gland on the right suggests possibility of mild low lipoma, also stable. 1.5 cm low-attenuation lesion midpole left kidney laterally, larger than previous lesion in this area on 07/20/2010. It is not fully characterized without contrast but it is likely a cyst.  No acute musculoskeletal findings.  IMPRESSION: 1. 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 This can be seen on prior chest radiographs as well as MRI dating back to 2013. 2. Multifocal bilateral consolidation and infiltrates concerning for pneumonia. Areas of consolidation in the lung bases likely at least in part represent atelectasis, but on the left, there is patchy infiltrate associated with this both in the lower lobe and to a greater degree in the lingula. Similarly, right middle lobe consolidation may in part be due to atelectasis but there is also less consolidated infiltrate that is concerning for pneumonia. 3. 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 4. Enlarged low-attenuation lesion midpole left kidney measuring about 1.8 cm. This is not fully characterized without contrast but is likely a cyst, and this could be confirmed with renal ultrasound.   Electronically Signed   By: Skipper Cliche M.D.   On: 07/20/2014 18:44   Dg Chest Portable 1 View 07/20/2014   CLINICAL DATA:  Shortness of breath.  Initial encounter.  EXAM: PORTABLE CHEST - 1 VIEW  COMPARISON:  04/03/2014.  FINDINGS: The cardiopericardial silhouette is enlarged. Aortic arch atherosclerosis. Basilar atelectasis knee and airspace disease. The airspace disease is most compatible with  pulmonary edema and CHF.  18 mm nodular density is present in the RIGHT perihilar lung which appears to have enlarged compared to the prior exam. Previously, chest CT was recommended the for further evaluation. This recommendation is reiterated. This can be performed after the acute illness.  IMPRESSION: 1. Constellation of findings compatible with CHF. 2. 18 mm nodule in the perihilar RIGHT lung. Followup noncontrast chest CT recommended for further assessment.   Electronically Signed   By: Dereck Ligas M.D.   On: 07/20/2014 16:29    1910:  No CHF on CT scan; +multifocal pneumonia. Will tx with IV abx for HCAP and remove ntg paste (pt already given lasix). Hour Rozo neb given on pt's arrival to ED for Sats 87% R/A. Pt's O2 Sats now 91-93% on O2 3L N/C after neb. Lungs remain coarse. Dx and testing d/w pt and family.  Questions answered.  Verb understanding, agreeable to admit.  T/C to [facility] Dr. Darrick Meigs, case discussed, including:  HPI, pertinent PM/SHx, VS/PE, dx testing, ED course and treatment:  Agreeable to admit, requests to write temporary orders, obtain tele bed to team APAdmits.   Francine Graven, DO 07/22/14 1554

## 2014-07-20 NOTE — ED Notes (Signed)
Eric Scott brought pt to ED in waiting room today to be seen and this nurse called Eric Scott for report and med-tech reported he has generalized weakness and some SOB with cough x1 day.

## 2014-07-20 NOTE — H&P (Signed)
PCP:   HASANAJ,XAJE A, MD   Chief Complaint:  Shortness of breath  HPI:  64 year old male who  has a past medical history of Hypertension; Hypercholesteremia; Type II diabetes mellitus; Diabetic peripheral neuropathy; Stroke (05/2012); Anxiety; Spinal stenosis; Chronic pain; Depression; PVD (peripheral vascular disease); Peripheral edema; CHF (congestive heart failure); COPD (chronic obstructive pulmonary disease); and Tremor. Patient was sent to the ED from skilled facility [facility], with chief complaint of gradual onset of worsening shortness of breath, persistent cough for past 1 week. Patient has history of COPD and chronic diastolic heart failure. He has been taking Lasix 20 g daily. He denies chest pain. Has low-grade fever 100.5 in the ED. He denies dysuria. Chest x-ray showed congestive heart failure and CT chest was recommended for possible nodule which showed bilateral pneumonia. Patient did receive 1 dose of Lasix in the ED 40 mg IV 1. BNP was 147.0, 2-D echocardiogram from 2015 showed grade 2 diastolic dysfunction. Patient is a smoker and smokes 1 pack per day. He has a history of COPD. He denies nausea vomiting or diarrhea. No abdominal pain.  Allergies:   Allergies  Allergen Reactions  . Cucumber Extract Other (See Comments)    "Fells like I'm having a heart attack"  . Flexeril [Cyclobenzaprine Hcl] Other (See Comments)    "whole body  Tremors" (05/27/2012)  . Kiwi Extract Other (See Comments)    "feels like I'm having a heart attack"      Past Medical History  Diagnosis Date  . Hypertension   . Hypercholesteremia   . Type II diabetes mellitus   . Diabetic peripheral neuropathy     "chronic" (05/27/2012)  . Stroke 05/2012    Subacute, lacunar infarcts within the left basal ganglia and posterior limp of the left internal capsule/thalamus; "RUE; both feet weak" (05/27/2012)  . Anxiety   . Spinal stenosis     mild lumbar (MRI 05/2012)-L2-L3 to L4-L5 , mild lumbar  foraminal stenosis   . Chronic pain     legs, back; MRI 05/2012 with mild thoracic degenerative changes no spinal stenosis   . Depression   . PVD (peripheral vascular disease)   . Peripheral edema   . CHF (congestive heart failure)   . COPD (chronic obstructive pulmonary disease)   . Tremor     Past Surgical History  Procedure Laterality Date  . Hernia repair  01/05/2004    "belly button" (05/27/2012)  . Hernia repair  01/05/2004  . Ankle surgery      Prior to Admission medications   Medication Sig Start Date End Date Taking? Authorizing Provider  amLODipine (NORVASC) 5 MG tablet Take 2 tablets (10 mg total) by mouth daily. 04/09/14  Yes Kathie Dike, MD  aspirin EC 81 MG tablet Take 81 mg by mouth daily.   Yes Historical Provider, MD  atorvastatin (LIPITOR) 20 MG tablet Take 20 mg by mouth daily.    Yes Historical Provider, MD  calcium-vitamin D (OSCAL WITH D) 500-200 MG-UNIT per tablet Take 1 tablet by mouth 3 (three) times daily.   Yes Historical Provider, MD  citalopram (CELEXA) 20 MG tablet Take 20 mg by mouth daily.   Yes Historical Provider, MD  fish oil-omega-3 fatty acids 1000 MG capsule Take 1 g by mouth 2 (two) times daily.   Yes Historical Provider, MD  Fluticasone-Salmeterol (ADVAIR) 250-50 MCG/DOSE AEPB Inhale 1 puff into the lungs every 12 (twelve) hours.   Yes Historical Provider, MD  furosemide (LASIX) 20 MG tablet Take 20  mg by mouth daily.   Yes Historical Provider, MD  gabapentin (NEURONTIN) 800 MG tablet Take 800 mg by mouth 3 (three) times daily.   Yes Historical Provider, MD  glimepiride (AMARYL) 2 MG tablet Take 2 mg by mouth daily with breakfast.   Yes Historical Provider, MD  HYDROcodone-acetaminophen (NORCO) 10-325 MG per tablet Take 1 tablet by mouth 3 (three) times daily.   Yes Historical Provider, MD  metFORMIN (GLUCOPHAGE) 500 MG tablet Take 1,000 mg by mouth 2 (two) times daily with a meal.   Yes Historical Provider, MD  polyethylene glycol (MIRALAX)  packet Take 17 g by mouth daily. Patient taking differently: Take 17 g by mouth daily as needed for mild constipation or moderate constipation.  04/09/14  Yes Kathie Dike, MD  tiotropium (SPIRIVA) 18 MCG inhalation capsule Place 18 mcg into inhaler and inhale daily.   Yes Historical Provider, MD  AMBULATORY NON FORMULARY MEDICATION Oxycontin 10mg  tablet SA Sig: Take one tablet by mouth every 12 hours for pain. Hold for sedation. Do not crush Patient not taking: Reported on 07/20/2014 05/07/14   Tiffany L Reed, DO  diazepam (VALIUM) 10 MG tablet Take one tablet by mouth twice daily as needed for anxiety/agitation Patient not taking: Reported on 07/20/2014 05/06/14   Lauree Chandler, NP  doxycycline (VIBRAMYCIN) 100 MG capsule Take 1 capsule (100 mg total) by mouth 2 (two) times daily. Patient not taking: Reported on 07/20/2014 05/20/14   Fredia Sorrow, MD  ipratropium-albuterol (DUONEB) 0.5-2.5 (3) MG/3ML SOLN Take 3 mLs by nebulization every 6 (six) hours. 04/09/14   Kathie Dike, MD  Oxycodone HCl 10 MG TABS Take one tablet by mouth every 12 hours for pain. Hold for sedation. Do not crush Patient not taking: Reported on 07/20/2014 05/06/14   Lauree Chandler, NP  predniSONE (DELTASONE) 10 MG tablet Take 40 mg po daily for 2 days then 30mg  po daily for 2 days then 20mg  po daily for 2 days then 10mg  po daily for 2 days then stop Patient not taking: Reported on 05/20/2014 04/09/14   Kathie Dike, MD    Social History:  reports that he has been smoking Cigarettes.  He has a 22.5 pack-year smoking history. He has never used smokeless tobacco. He reports that he does not drink alcohol or use illicit drugs.  Family history- patient's mother died of liver cancer, and most of family members had diabetes mellitus.  All the positives are listed in BOLD  Review of Systems:  HEENT: Headache, blurred vision, runny nose, sore throat Neck: Hypothyroidism, hyperthyroidism,,lymphadenopathy Chest :  Shortness of breath, history of COPD, Asthma Heart : Chest pain, history of coronary arterey disease GI:  Nausea, vomiting, diarrhea, constipation, GERD GU: Dysuria, urgency, frequency of urination, hematuria Neuro: Stroke, seizures, syncope Psych: Depression, anxiety, hallucinations   Physical Exam: Blood pressure 111/52, pulse 103, temperature 100.5 F (38.1 C), temperature source Rectal, resp. rate 17, height 6' (1.829 m), weight 129.275 kg (285 lb), SpO2 91 %. Constitutional:   Patient is a well-developed and well-nourished male in mild respiratory distress and cooperative with exam. Head: Normocephalic and atraumatic Mouth: Mucus membranes moist Eyes: PERRL, EOMI, conjunctivae normal Neck: Supple, No Thyromegaly Cardiovascular: RRR, S1 normal, S2 normal Pulmonary/Chest: Bilateral rhonchi Abdominal: Soft. Non-tender, non-distended, bowel sounds are normal, no masses, organomegaly, or guarding present.  Neurological: A&O x3, Strength is normal and symmetric bilaterally, cranial nerve II-XII are grossly intact, no focal motor deficit, sensory intact to light touch bilaterally.  Extremities : No  Cyanosis, Clubbing, bilateral 1+ pitting edema of the lower extremities  Labs on Admission:  Basic Metabolic Panel:  Recent Labs Lab 07/20/14 1537  NA 138  K 3.7  CL 104  CO2 26  GLUCOSE 149*  BUN 25*  CREATININE 1.19  CALCIUM 9.0   Liver Function Tests:  Recent Labs Lab 07/20/14 1537  AST 14  ALT 14  ALKPHOS 51  BILITOT 1.1  PROT 7.1  ALBUMIN 3.8   No results for input(s): LIPASE, AMYLASE in the last 168 hours. No results for input(s): AMMONIA in the last 168 hours. CBC:  Recent Labs Lab 07/20/14 1537  WBC 20.5*  NEUTROABS 18.2*  HGB 13.6  HCT 40.9  MCV 96.7  PLT 191   Cardiac Enzymes:  Recent Labs Lab 07/20/14 1537  TROPONINI <0.03    BNP (last 3 results)  Recent Labs  07/20/14 1537  BNP 147.0*    ProBNP (last 3 results)  Recent Labs   04/05/14 0621  PROBNP 1106.0*    CBG: No results for input(s): GLUCAP in the last 168 hours.  Radiological Exams on Admission: Ct Chest Wo Contrast  07/20/2014   CLINICAL DATA:  Further evaluation of pulmonary nodular opacity seen on prior chest radiographs  EXAM: CT CHEST WITHOUT CONTRAST  TECHNIQUE: Multidetector CT imaging of the chest was performed following the standard protocol without IV contrast.  COMPARISON:  MRI thoracic spine 05/29/2012, chest radiograph performed 05/27/2012, 02/05/2013, 04/03/2014, and 07/20/2014  FINDINGS: 12 mm right perihilar circumscribed round mass identified. There is central low attenuation with average attenuation value of -46 in this area. No calcification. This mass is relatively low-attenuation on chest radiographs but can be seen dating back to 2013. There are no other lung nodules.  There is infiltrate in the deep tendon portion of the right middle lobe. There is consolidation in the inferior medial right middle lobe with air bronchograms. There is consolidation in the posterior medial right lower lobe. There is consolidation in the inferior lingula with air bronchograms and surrounding patchy infiltrate. There is consolidation with air bronchograms in the dependent portion of the left lower lobe. There is also surrounding patchy multinodular infiltrate anterior to the consolidation in the left lower lobe.  No plural effusion. Minimal pericardial thickening. Mild to moderate cardiac enlargement noted. Mild coronary artery calcification.  Mild calcification of the thoracic aorta. Thoracic inlet and thyroid appear normal. Small nonpathologic mediastinal lymph nodes present.  Bilateral adrenal hyperplasia stable from 07/20/2010. Fat containing area in the posterior adrenal gland on the right suggests possibility of mild low lipoma, also stable. 1.5 cm low-attenuation lesion midpole left kidney laterally, larger than previous lesion in this area on 07/20/2010. It is  not fully characterized without contrast but it is likely a cyst.  No acute musculoskeletal findings.  IMPRESSION: 1. 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 This can be seen on prior chest radiographs as well as MRI dating back to 2013. 2. Multifocal bilateral consolidation and infiltrates concerning for pneumonia. Areas of consolidation in the lung bases likely at least in part represent atelectasis, but on the left, there is patchy infiltrate associated with this both in the lower lobe and to a greater degree in the lingula. Similarly, right middle lobe consolidation may in part be due to atelectasis but there is also less consolidated infiltrate that is concerning for pneumonia. 3. 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 4. Enlarged low-attenuation lesion midpole left kidney measuring  about 1.8 cm. This is not fully characterized without contrast but is likely a cyst, and this could be confirmed with renal ultrasound.   Electronically Signed   By: Skipper Cliche M.D.   On: 07/20/2014 18:44   Dg Chest Portable 1 View  07/20/2014   CLINICAL DATA:  Shortness of breath.  Initial encounter.  EXAM: PORTABLE CHEST - 1 VIEW  COMPARISON:  04/03/2014.  FINDINGS: The cardiopericardial silhouette is enlarged. Aortic arch atherosclerosis. Basilar atelectasis knee and airspace disease. The airspace disease is most compatible with pulmonary edema and CHF.  18 mm nodular density is present in the RIGHT perihilar lung which appears to have enlarged compared to the prior exam. Previously, chest CT was recommended the for further evaluation. This recommendation is reiterated. This can be performed after the acute illness.  IMPRESSION: 1. Constellation of findings compatible with CHF. 2. 18 mm nodule in the perihilar RIGHT lung. Followup noncontrast chest CT recommended for further assessment.   Electronically Signed   By: Dereck Ligas M.D.   On: 07/20/2014 16:29    EKG: Independently reviewed. Normal sinus rhythm, nonspecific ST-T changes   Assessment/Plan Principal Problem:   HCAP (healthcare-associated pneumonia) Active Problems:   DM type 2 (diabetes mellitus, type 2)   Chronic diastolic CHF (congestive heart failure)   Pneumonia  Healthcare associated pneumonia Patient CT chest shows bilateral pneumonia, he does have high white count of 20,000, will continue patient on vancomycin and cefepime. Will obtain urinary strep pneumo antigen as well as Legionella antigen. Follow blood cultures 2. Continue oxygen via nasal cannula. Will avoid the IV fluids at this time due to the acute on chronic diastolic heart failure.  Acute on chronic diastolic heart failure Patient appears to have fluid overload, with chest x-ray showing bilateral pulmonary venous congestion. He did receive 1 dose of Lasix in the ED, will start Lasix 20 daily grams IV every 12 hours from tomorrow morning. Will avoid IV fluids at this time due to the heart failure.   COPD Continue with DuoNeb nebulizers every 6 hours.  Diabetes mellitus We'll initiate sliding scale insulin. Hold metformin at this time  ? 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  DVT prophylaxis Lovenox  Code status: Full code  Family discussion: Admission, patients condition and plan of care including tests being ordered have been discussed with the patient and his son at bedside* who indicate understanding and agree with the plan and Code Status.   Time Spent on Admission: 65 minutes  [facility] Pager: [phone] 07/20/2014, 7:52 PM  If 7PM-7AM, please contact night-coverage  [email or web address]  Password [email or web address]

## 2014-07-20 NOTE — ED Notes (Signed)
Nitro paste was removed from left chest per EDP

## 2014-07-20 NOTE — Progress Notes (Signed)
ANTIBIOTIC CONSULT NOTE - FOLLOW UP  Pharmacy Consult for Vancomycin and Cefepime  Indication: pneumonia  Allergies  Allergen Reactions  . Cucumber Extract Other (See Comments)    "Fells like I'm having a heart attack"  . Flexeril [Cyclobenzaprine Hcl] Other (See Comments)    "whole body  Tremors" (05/27/2012)  . Kiwi Extract Other (See Comments)    "feels like I'm having a heart attack"    Patient Measurements: Height: 6' (182.9 cm) Weight: 285 lb (129.275 kg) IBW/kg (Calculated) : 77.6  Vital Signs: Temp: 100.5 F (38.1 C) (02/16 1530) Temp Source: Rectal (02/16 1530) BP: 111/52 mmHg (02/16 1830) Pulse Rate: 103 (02/16 1845) Intake/Output from previous day:   Intake/Output from this shift:    Labs:  Recent Labs  07/20/14 1537  WBC 20.5*  HGB 13.6  PLT 191  CREATININE 1.19   Estimated Creatinine Clearance: 87.2 mL/min (by C-G formula based on Cr of 1.19). No results for input(s): VANCOTROUGH, VANCOPEAK, VANCORANDOM, GENTTROUGH, GENTPEAK, GENTRANDOM, TOBRATROUGH, TOBRAPEAK, TOBRARND, AMIKACINPEAK, AMIKACINTROU, AMIKACIN in the last 72 hours.   Microbiology: No results found for this or any previous visit (from the past 720 hour(s)).  Anti-infectives    Start     Dose/Rate Route Frequency Ordered Stop   07/20/14 2100  vancomycin (VANCOCIN) IVPB 1000 mg/200 mL premix     1,000 mg 200 mL/hr over 60 Minutes Intravenous  Once 07/20/14 2029     07/20/14 2030  vancomycin (VANCOCIN) IVPB 1000 mg/200 mL premix  Status:  Discontinued     1,000 mg 200 mL/hr over 60 Minutes Intravenous  Once 07/20/14 1915 07/20/14 1932   07/20/14 1915  vancomycin (VANCOCIN) IVPB 1000 mg/200 mL premix     1,000 mg 200 mL/hr over 60 Minutes Intravenous  Once 07/20/14 1915 07/20/14 2028   07/20/14 1900  ceFEPIme (MAXIPIME) 1 g in dextrose 5 % 50 mL IVPB     1 g 100 mL/hr over 30 Minutes Intravenous  Once 07/20/14 1847 07/20/14 1921     Assessment: Okay for Protocol, NH patient being  treated for HCAP, elevated WBC.  Initial dose given in ED.  Obesity/Normalized CrCl dosing protocol will be initiate with an estimated normalized CrCl = 64 ml/min.   Goal of Therapy:  Vancomycin trough level 15-20 mcg/ml  Plan:  Vancomycin 2gm load, then 1gm IV every 12 hours. Cefepime 2gm IV every 8 hours. Measure antibiotic drug levels at steady state Follow up culture results  Pricilla Larsson 07/20/2014,8:29 PM

## 2014-07-21 ENCOUNTER — Inpatient Hospital Stay (HOSPITAL_COMMUNITY): Payer: Medicare Other

## 2014-07-21 DIAGNOSIS — F419 Anxiety disorder, unspecified: Secondary | ICD-10-CM | POA: Diagnosis present

## 2014-07-21 DIAGNOSIS — J9601 Acute respiratory failure with hypoxia: Secondary | ICD-10-CM

## 2014-07-21 DIAGNOSIS — I739 Peripheral vascular disease, unspecified: Secondary | ICD-10-CM | POA: Diagnosis present

## 2014-07-21 DIAGNOSIS — E1142 Type 2 diabetes mellitus with diabetic polyneuropathy: Secondary | ICD-10-CM

## 2014-07-21 LAB — COMPREHENSIVE METABOLIC PANEL
ALT: 13 U/L (ref 0–53)
ANION GAP: 8 (ref 5–15)
AST: 14 U/L (ref 0–37)
Albumin: 3.4 g/dL — ABNORMAL LOW (ref 3.5–5.2)
Alkaline Phosphatase: 41 U/L (ref 39–117)
BUN: 22 mg/dL (ref 6–23)
CALCIUM: 8.9 mg/dL (ref 8.4–10.5)
CO2: 29 mmol/L (ref 19–32)
CREATININE: 1.23 mg/dL (ref 0.50–1.35)
Chloride: 103 mmol/L (ref 96–112)
GFR calc non Af Amer: 60 mL/min — ABNORMAL LOW (ref >90)
GFR, EST AFRICAN AMERICAN: 70 mL/min — AB (ref >90)
Glucose, Bld: 140 mg/dL — ABNORMAL HIGH (ref 70–99)
POTASSIUM: 3.8 mmol/L (ref 3.5–5.1)
Sodium: 140 mmol/L (ref 135–145)
TOTAL PROTEIN: 6.5 g/dL (ref 6.0–8.3)
Total Bilirubin: 1.1 mg/dL (ref 0.3–1.2)

## 2014-07-21 LAB — GLUCOSE, CAPILLARY
GLUCOSE-CAPILLARY: 186 mg/dL — AB (ref 70–99)
GLUCOSE-CAPILLARY: 164 mg/dL — AB (ref 70–99)
Glucose-Capillary: 136 mg/dL — ABNORMAL HIGH (ref 70–99)
Glucose-Capillary: 198 mg/dL — ABNORMAL HIGH (ref 70–99)

## 2014-07-21 LAB — BLOOD GAS, ARTERIAL
Acid-Base Excess: 4.8 mmol/L — ABNORMAL HIGH (ref 0.0–2.0)
Bicarbonate: 28.8 mEq/L — ABNORMAL HIGH (ref 20.0–24.0)
Drawn by: 234301
O2 CONTENT: 6 L/min
O2 SAT: 89.9 %
Patient temperature: 37
TCO2: 25.7 mmol/L (ref 0–100)
pCO2 arterial: 42.3 mmHg (ref 35.0–45.0)
pH, Arterial: 7.447 (ref 7.350–7.450)
pO2, Arterial: 56.4 mmHg — ABNORMAL LOW (ref 80.0–100.0)

## 2014-07-21 LAB — CBC
HEMATOCRIT: 38.1 % — AB (ref 39.0–52.0)
Hemoglobin: 12.5 g/dL — ABNORMAL LOW (ref 13.0–17.0)
MCH: 32.1 pg (ref 26.0–34.0)
MCHC: 32.8 g/dL (ref 30.0–36.0)
MCV: 97.9 fL (ref 78.0–100.0)
PLATELETS: 167 10*3/uL (ref 150–400)
RBC: 3.89 MIL/uL — ABNORMAL LOW (ref 4.22–5.81)
RDW: 14.8 % (ref 11.5–15.5)
WBC: 15.1 10*3/uL — AB (ref 4.0–10.5)

## 2014-07-21 LAB — URINE CULTURE
Colony Count: NO GROWTH
Culture: NO GROWTH

## 2014-07-21 LAB — STREP PNEUMONIAE URINARY ANTIGEN: Strep Pneumo Urinary Antigen: NEGATIVE

## 2014-07-21 LAB — MRSA PCR SCREENING: MRSA by PCR: NEGATIVE

## 2014-07-21 MED ORDER — DIAZEPAM 5 MG PO TABS
5.0000 mg | ORAL_TABLET | Freq: Three times a day (TID) | ORAL | Status: DC
  Administered 2014-07-21 – 2014-07-23 (×5): 5 mg via ORAL
  Filled 2014-07-21 (×5): qty 1

## 2014-07-21 MED ORDER — DEXTROSE 5 % IV SOLN
INTRAVENOUS | Status: AC
  Filled 2014-07-21: qty 2

## 2014-07-21 MED ORDER — FUROSEMIDE 10 MG/ML IJ SOLN
20.0000 mg | Freq: Once | INTRAMUSCULAR | Status: AC
  Administered 2014-07-21: 20 mg via INTRAVENOUS

## 2014-07-21 MED ORDER — OXYCODONE-ACETAMINOPHEN 5-325 MG PO TABS
1.0000 | ORAL_TABLET | ORAL | Status: DC | PRN
  Administered 2014-07-22 – 2014-07-23 (×2): 1 via ORAL
  Filled 2014-07-21 (×3): qty 1

## 2014-07-21 MED ORDER — IPRATROPIUM-ALBUTEROL 0.5-2.5 (3) MG/3ML IN SOLN
3.0000 mL | RESPIRATORY_TRACT | Status: DC
  Administered 2014-07-21 – 2014-07-23 (×13): 3 mL via RESPIRATORY_TRACT
  Filled 2014-07-21 (×14): qty 3

## 2014-07-21 MED ORDER — OXYCODONE-ACETAMINOPHEN 5-325 MG PO TABS
2.0000 | ORAL_TABLET | Freq: Once | ORAL | Status: AC
Start: 2014-07-21 — End: 2014-07-21
  Administered 2014-07-21: 2 via ORAL
  Filled 2014-07-21: qty 2

## 2014-07-21 NOTE — Progress Notes (Signed)
[facility] PROGRESS NOTE  PEER MINNS [id number] DOB: [birth year] DOA: 07/20/2014 PCP: [name], MD  Assessment/Plan:  Acute respiratory failure with hypoxia  Secondary to bilateral pneumonia verified by chest CT in setting of likely COPD and acute on chronic diastolic HF. Oxygen saturation level 86% on 6L. resp rate 22. Poor cough effort. ABG with ph 7.4 pO2 56.4 pCO2 42.3. Continue oxygen supplementation, antibiotics, and nebs. Monitor very closely. If worsens will repeat chest xray and consider transferring to ICU  Healthcare associated pneumonia Patient CT chest shows bilateral pneumonia. Lactic acid 2.6. White count tending down. Max temp 99.1. Hemodynamically stable. Continue vancomycin and cefepime day #2.  Await urinary strep pneumo antigen as well as Legionella antigen. Follow blood cultures 2. Monitor closely.   Acute on chronic diastolic heart failure Chest xray concerning for CHF, CT without evidence but clinical findings LE edema. BNP 147. Echo 11/15 with severe LVH, EF 60% and grade 2 diastolic dysfunction.  Continue lasix 20 daily grams IV every 12 hours from tomorrow morning. Monitor intake and output. Daily weights.  Will avoid IV fluids at this time due to the heart failure.   COPD  Ongoing smoker. Reports sob with daily activity. Not on home oxygen. Continue with DuoNeb nebulizers change to every 4 hours. On spiriva and nebs at home.  May need home oxygen.  Diabetes mellitus  A1c pending. Home meds include amaryl and metformin which are on hold. Appetite good this am. We'll continue sliding scale insulin and adjust as indicated.   ? 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 OP monitoring  HTN Stable. Home meds include norvasc, lasix. Continuing these.  Monitor closely  Diabetic neuropathy Stable at baseline continue gabapentin  Anxiety Home meds include valium 10mg  BID as needed. Son reports pt takes 3 times daily.  Will provide lower dose as not to interfere with respiratory effort. Continue clelexa  monitor    Code Status: full Family Communication: son at bedside Disposition Plan: home when ready   Consultants:  none  Procedures:  none  Antibiotics:  Vancomycin 07/20/14>>  Cefepime 07/20/14>>  HPI/Subjective: Awake, denies pain. Complains SOB and weakness  Objective: Filed Vitals:   07/21/14 0813  BP: 128/41  Pulse: 88  Temp: 99.1 F (37.3 C)  Resp: 26    Intake/Output Summary (Last 24 hours) at 07/21/14 0955 Last data filed at 07/20/14 2355  Gross per 24 hour  Intake    200 ml  Output    450 ml  Net   -250 ml   Filed Weights   07/20/14 1503 07/20/14 2056 07/21/14 0500  Weight: 129.275 kg (285 lb) 134.038 kg (295 lb 8 oz) 133.811 kg (295 lb)    Exam:   General:  Obese appears somewhat ill, non-toxic  Cardiovascular: RRR no MGR 1-2+LE edema  Respiratory: mild increased work of breathing fair air movement, some coarseness to BS no wheeze  Abdomen: obese soft +BS non-tender to palpation  Musculoskeletal: LE with chronic venous stasis changes. No clubbing or cyanosis   Data Reviewed: Basic Metabolic Panel:  Recent Labs Lab 07/20/14 1537 07/21/14 0539  NA 138 140  K 3.7 3.8  CL 104 103  CO2 26 29  GLUCOSE 149* 140*  BUN 25* 22  CREATININE 1.19 1.23  CALCIUM 9.0 8.9   Liver Function Tests:  Recent Labs Lab 07/20/14 1537 07/21/14 0539  AST 14 14  ALT 14 13  ALKPHOS 51 41  BILITOT 1.1 1.1  PROT 7.1 6.5  ALBUMIN 3.8 3.4*   No results for input(s): LIPASE, AMYLASE in the last 168 hours. No results for input(s): AMMONIA in the last 168 hours. CBC:  Recent Labs Lab 07/20/14 1537 07/21/14 0539  WBC 20.5* 15.1*  NEUTROABS 18.2*  --   HGB 13.6 12.5*  HCT 40.9 38.1*  MCV  96.7 97.9  PLT 191 167   Cardiac Enzymes:  Recent Labs Lab 07/20/14 1537  TROPONINI <0.03   BNP (last 3 results)  Recent Labs  07/20/14 1537  BNP 147.0*    ProBNP (last 3 results)  Recent Labs  04/05/14 0621  PROBNP 1106.0*    CBG:  Recent Labs Lab 07/20/14 2052 07/21/14 0754  GLUCAP 230* 136*    Recent Results (from the past 240 hour(s))  Culture, blood (routine x 2)     Status: None (Preliminary result)   Collection Time: 07/20/14  7:45 PM  Result Value Ref Range Status   Specimen Description BLOOD LEFT ANTECUBITAL  Final   Special Requests BOTTLES DRAWN AEROBIC AND ANAEROBIC 8CC EACH  Final   Culture PENDING  Incomplete   Report Status PENDING  Incomplete  Culture, blood (routine x 2)     Status: None (Preliminary result)   Collection Time: 07/20/14  9:39 PM  Result Value Ref Range Status   Specimen Description BLOOD RIGHT ANTECUBITAL  Final   Special Requests   Final    BOTTLES DRAWN AEROBIC AND ANAEROBIC AEB 10CC ANA 8CC   Culture PENDING  Incomplete   Report Status PENDING  Incomplete  MRSA PCR Screening     Status: None   Collection Time: 07/20/14 10:40 PM  Result Value Ref Range Status   MRSA by PCR NEGATIVE NEGATIVE Final    Comment:        The GeneXpert MRSA Assay (FDA approved for NASAL specimens only), is one component of a comprehensive MRSA colonization surveillance program. It is not intended to diagnose MRSA infection nor to guide or monitor treatment for MRSA infections.      Studies: Ct Chest Wo Contrast  07/20/2014   CLINICAL DATA:  Further evaluation of pulmonary nodular opacity seen on prior chest radiographs  EXAM: CT CHEST WITHOUT CONTRAST  TECHNIQUE: Multidetector CT imaging of the chest was performed following the standard protocol without IV contrast.  COMPARISON:  MRI thoracic spine 05/29/2012, chest radiograph performed 05/27/2012, 02/05/2013, 04/03/2014, and 07/20/2014  FINDINGS: 12 mm right perihilar circumscribed  round mass identified. There is central low attenuation with average attenuation value of -46 in this area. No calcification. This mass is relatively low-attenuation on chest radiographs but can be seen dating back to 2013. There are no other lung nodules.  There is infiltrate in the deep tendon portion of the right middle lobe. There is consolidation in the inferior medial right middle lobe with air bronchograms. There is consolidation in the posterior medial right lower lobe. There is consolidation in the inferior lingula with air bronchograms and surrounding patchy infiltrate. There is consolidation with air bronchograms in the dependent portion of the left lower lobe. There is also surrounding patchy multinodular infiltrate anterior to the consolidation in the left lower lobe.  No plural effusion. Minimal pericardial thickening. Mild to moderate cardiac enlargement noted. Mild coronary artery calcification.  Mild calcification of the thoracic aorta. Thoracic inlet and thyroid appear normal. Small nonpathologic  mediastinal lymph nodes present.  Bilateral adrenal hyperplasia stable from 07/20/2010. Fat containing area in the posterior adrenal gland on the right suggests possibility of mild low lipoma, also stable. 1.5 cm low-attenuation lesion midpole left kidney laterally, larger than previous lesion in this area on 07/20/2010. It is not fully characterized without contrast but it is likely a cyst.  No acute musculoskeletal findings.  IMPRESSION: 1. 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 This can be seen on prior chest radiographs as well as MRI dating back to 2013. 2. Multifocal bilateral consolidation and infiltrates concerning for pneumonia. Areas of consolidation in the lung bases likely at least in part represent atelectasis, but on the left, there is patchy infiltrate associated with this both in the lower lobe and to a greater degree in the lingula.  Similarly, right middle lobe consolidation may in part be due to atelectasis but there is also less consolidated infiltrate that is concerning for pneumonia. 3. 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 4. Enlarged low-attenuation lesion midpole left kidney measuring about 1.8 cm. This is not fully characterized without contrast but is likely a cyst, and this could be confirmed with renal ultrasound.   Electronically Signed   By: Skipper Cliche M.D.   On: 07/20/2014 18:44   Dg Chest Portable 1 View  07/20/2014   CLINICAL DATA:  Shortness of breath.  Initial encounter.  EXAM: PORTABLE CHEST - 1 VIEW  COMPARISON:  04/03/2014.  FINDINGS: The cardiopericardial silhouette is enlarged. Aortic arch atherosclerosis. Basilar atelectasis knee and airspace disease. The airspace disease is most compatible with pulmonary edema and CHF.  18 mm nodular density is present in the RIGHT perihilar lung which appears to have enlarged compared to the prior exam. Previously, chest CT was recommended the for further evaluation. This recommendation is reiterated. This can be performed after the acute illness.  IMPRESSION: 1. Constellation of findings compatible with CHF. 2. 18 mm nodule in the perihilar RIGHT lung. Followup noncontrast chest CT recommended for further assessment.   Electronically Signed   By: Dereck Ligas M.D.   On: 07/20/2014 16:29    Scheduled Meds: . amLODipine  10 mg Oral Daily  . antiseptic oral rinse  7 mL Mouth Rinse BID  . aspirin EC  81 mg Oral Daily  . ceFEPime (MAXIPIME) IV  2 g Intravenous Q8H  . citalopram  20 mg Oral Daily  . enoxaparin (LOVENOX) injection  70 mg Subcutaneous Q24H  . furosemide  20 mg Intravenous Q12H  . gabapentin  800 mg Oral TID  . guaiFENesin  1,200 mg Oral BID  . insulin aspart  0-9 Units Subcutaneous TID WC  . ipratropium-albuterol  3 mL Nebulization Q4H  . sodium chloride  3 mL Intravenous Q12H  . sodium chloride  3 mL  Intravenous Q12H  . vancomycin  1,000 mg Intravenous Q12H   Continuous Infusions:   Principal Problem:   Acute respiratory failure with hypoxia Active Problems:   DM type 2 (diabetes mellitus, type 2)   Tobacco abuse   HTN (hypertension)   Chronic diastolic CHF (congestive heart failure)   Solitary pulmonary nodule   HCAP (healthcare-associated pneumonia)   Pneumonia   Diabetic peripheral neuropathy   Anxiety   PVD (peripheral vascular disease)    Time spent: 40 minutes    [facility] Pager [phone]. If 7PM-7AM, please contact night-coverage at [email or web address], password [facility] 07/21/2014, 9:55 AM  LOS: 1  day

## 2014-07-21 NOTE — Progress Notes (Signed)
UR chart review completed.  

## 2014-07-21 NOTE — Clinical Social Work Psychosocial (Signed)
Clinical Social Work Department BRIEF PSYCHOSOCIAL ASSESSMENT 07/21/2014  Patient:  Eric Scott, Eric Scott     Account Number:  [id number]     Admit date:  07/20/2014  Clinical Social Worker:  Eric Scott  Date/Time:  07/21/2014 09:24 AM  Referred by:  CSW  Date Referred:  07/21/2014 Referred for  ALF Placement   Other Referral:   Interview type:  Patient Other interview type:   son- Eric Scott    PSYCHOSOCIAL DATA Living Status:  FACILITY Admitted from facility:  [place] Level of care:  Assisted Living Primary support name:  [name] Primary support relationship to patient:  CHILD, ADULT Degree of support available:   supportive    CURRENT CONCERNS Current Concerns  Post-Acute Placement   Other Concerns:    SOCIAL WORK ASSESSMENT / PLAN CSW met with pt and pt's son, Eric Scott at bedside. Pt alert and oriented and reports he has been at [facility] for 2 years now. He had increased congestion and shortness of breath for several days and came to ED for evaluation. Admitted with pneumonia. Pt's son Eric Scott appears to be involved and supportive. Pt reports he has friends there which has made transition to ALF easier for him. Per Eric Scott at [facility], pt requires assist with bathing, dressing, and toileting. He primarily uses a wheelchair unless staff are able to assist with ambulating. Pt has had home health in the past, but not at this time. Okay for return.   Assessment/plan status:  Psychosocial Support/Ongoing Assessment of Needs Other assessment/ plan:   Information/referral to community resources:   [place]    PATIENT'S/FAMILY'S RESPONSE TO PLAN OF CARE: Pt requests to return to [facility] when medically stable. CSW will continue to follow.       Eric Scott, [place]

## 2014-07-21 NOTE — Care Management Note (Addendum)
    Page 1 of 2   07/26/2014     12:57:02 PM CARE MANAGEMENT NOTE 07/26/2014  Patient:  Eric Scott, Eric Scott   Account Number:  [id number]  Date Initiated:  07/21/2014  Documentation initiated by:  Theophilus Kinds  Subjective/Objective Assessment:   Pt admitted from [facility] with pneumonia. Anticipate pt will discharge back to facility when medically stable.     Action/Plan:   CSW is aware and will arrange discharge when medically stable.   Anticipated DC Date:  07/24/2014   Anticipated DC Plan:  ASSISTED LIVING / REST HOME  In-house referral  Clinical Social Worker      DC Forensic scientist  CM consult      [facility] Choice  HOME HEALTH   Choice offered to / List presented to:  C-1 Patient        [place] arranged  HH-1 RN  [place].   Status of service:  Completed, signed off Medicare Important Message given?  YES (If response is "NO", the following Medicare IM given date fields will be blank) Date Medicare IM given:  07/23/2014 Medicare IM given by:  Christinia Gully C Date Additional Medicare IM given:  07/26/2014 Additional Medicare IM given by:  Theophilus Kinds  Discharge Disposition:  ASSISTED LIVING  Per UR Regulation:    If discussed at [facility] Length of Stay Meetings, dates discussed:    Comments:  07/26/14 [place], RN BSN CM Pt discharged back to [facility] with [facility] RN and PT (pt already active with [facility] diaper program). Romualdo Bolk of [facility] is aware and will collect the pts information from the chart. [place] services to start within 48 hours of discharge. Pt does not qualify for home O2 at this time. CSW to arrange discharge to facilty.  07/23/14 [place], RN BSN CM Pt potential discharge over the weekend back to [facility]. PT did recommend HH PT at discharge. Pt is on the [facility] diaper program and will add [facility] RN with [facility] as well (per pt choice). Romualdo Bolk of [facility] is aware and will collect the pts information  from the chart. [place] services to start within 48 hours of discharge. Pt stated that he as a walker at the facility. Pts O2 sats 94% RA at rest. Weekend staff to continue to monitor and arrange home O2 if needed at discharge. Pt and pts nurse aware of discharge arrangements.  07/21/14 [place], RN BSN CM

## 2014-07-22 DIAGNOSIS — Z720 Tobacco use: Secondary | ICD-10-CM

## 2014-07-22 LAB — BASIC METABOLIC PANEL
Anion gap: 8 (ref 5–15)
BUN: 19 mg/dL (ref 6–23)
CO2: 30 mmol/L (ref 19–32)
CREATININE: 1.14 mg/dL (ref 0.50–1.35)
Calcium: 8.5 mg/dL (ref 8.4–10.5)
Chloride: 103 mmol/L (ref 96–112)
GFR, EST AFRICAN AMERICAN: 77 mL/min — AB (ref >90)
GFR, EST NON AFRICAN AMERICAN: 66 mL/min — AB (ref >90)
GLUCOSE: 144 mg/dL — AB (ref 70–99)
POTASSIUM: 3.6 mmol/L (ref 3.5–5.1)
Sodium: 141 mmol/L (ref 135–145)

## 2014-07-22 LAB — CBC
HCT: 36.9 % — ABNORMAL LOW (ref 39.0–52.0)
Hemoglobin: 11.8 g/dL — ABNORMAL LOW (ref 13.0–17.0)
MCH: 31.1 pg (ref 26.0–34.0)
MCHC: 32 g/dL (ref 30.0–36.0)
MCV: 97.4 fL (ref 78.0–100.0)
Platelets: 160 10*3/uL (ref 150–400)
RBC: 3.79 MIL/uL — ABNORMAL LOW (ref 4.22–5.81)
RDW: 14.4 % (ref 11.5–15.5)
WBC: 9.8 10*3/uL (ref 4.0–10.5)

## 2014-07-22 LAB — GLUCOSE, CAPILLARY
GLUCOSE-CAPILLARY: 229 mg/dL — AB (ref 70–99)
Glucose-Capillary: 141 mg/dL — ABNORMAL HIGH (ref 70–99)
Glucose-Capillary: 184 mg/dL — ABNORMAL HIGH (ref 70–99)
Glucose-Capillary: 199 mg/dL — ABNORMAL HIGH (ref 70–99)

## 2014-07-22 LAB — LEGIONELLA ANTIGEN, URINE

## 2014-07-22 LAB — HEMOGLOBIN A1C
Hgb A1c MFr Bld: 5.7 % — ABNORMAL HIGH (ref 4.8–5.6)
Mean Plasma Glucose: 117 mg/dL

## 2014-07-22 MED ORDER — OXYCODONE HCL 5 MG PO TABS
5.0000 mg | ORAL_TABLET | Freq: Once | ORAL | Status: AC
Start: 2014-07-22 — End: 2014-07-22
  Administered 2014-07-22: 5 mg via ORAL
  Filled 2014-07-22: qty 1

## 2014-07-22 NOTE — Progress Notes (Signed)
[facility] PROGRESS NOTE  Eric Scott [name] DOB: 1950/08/30 DOA: 07/20/2014 PCP: [name], MD  Assessment/Plan: Acute respiratory failure with hypoxia  Secondary to bilateral pneumonia verified by chest CT in setting of likely COPD and acute on chronic diastolic HF. Improving today.  Oxygen saturation level >90% on 5L.  Improved cough effort.  Continue oxygen supplementation, antibiotics, and nebs. Mobilize. monitor  Healthcare associated pneumonia  repeat chest xray 07/21/14 with persistent left lower lobe and left mid lung pneumonia. White count within limits of normal. Max temp 99.3. Continue vancomycin and cefepime day #3.Strep pneumo antigen negative and Legionella urinary  Antigen in process. Blood cultures no growth to date. Monitor.   Acute on chronic diastolic heart failure Chest xray concerning for CHF, CT without evidence but clinical findings LE edema. Echo 11/15 with severe LVH, EF 60% and grade 2 diastolic dysfunction. Intake and output data incomplete and weights not reliable. Continues with LE edema. Fine crackles lung bases. Continue lasix 20 daily grams IV every 12 hours.  Monitor intake and output. Daily weights. Will avoid IV fluids at this time due to the heart failure.   COPD Ongoing smoker. Reports sob with daily activity. Not on home oxygen. Continue with DuoNeb nebulizers.  On spiriva and nebs at home. May need home oxygen.  Diabetes mellitus A1c 5.7. Appetite remains good. cbg 141.  We'll continue sliding scale insulin and adjust as indicated.   ? 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 OP monitoring  HTN  Fair control.  Home meds include norvasc, lasix. Continuing these. Monitor closely  Diabetic neuropathy Stable at baseline continue  gabapentin  Anxiety Home meds include valium 10mg  BID as needed. Son reports pt takes 3 times daily. Will provide lower dose. Mild UE tremors.  Continue clelexa monitor   Code Status: full Family Communication: none present Disposition Plan: home when ready. Will get PT eval tomorrow   Consultants:  none  Procedures:  none  Antibiotics:  Vancomycin 07/20/14>>  Cefepime 07/20/14>>  HPI/Subjective: Sitting in chair. Reports feeling "out of sorts". Denies sob  Objective: Filed Vitals:   07/22/14 0543  BP: 136/72  Pulse: 74  Temp: 99.3 F (37.4 C)  Resp: 20    Intake/Output Summary (Last 24 hours) at 07/22/14 1012 Last data filed at 07/22/14 0346  Gross per 24 hour  Intake      0 ml  Output    500 ml  Net   -500 ml   Filed Weights   07/20/14 2056 07/21/14 0500 07/22/14 0602  Weight: 134.038 kg (295 lb 8 oz) 133.811 kg (295 lb) 112.7 kg (248 lb 7.3 oz)    Exam:   General:  Obese appears comfortable  Cardiovascular: RRR no MGR 2+LE edema  Respiratory: mild increased work of breathing with conversation. Improved air movement fine crackles particularly left base, faint expiratory wheeze anteriorly  Abdomen: obese soft +BS non-tender to palpation no guarding  Musculoskeletal: LE with bilateral chronic venous stasis changes. Joints without swelling/erythema   Data Reviewed: Basic Metabolic Panel:  Recent Labs Lab 07/20/14 1537 07/21/14 0539 07/22/14 0607  NA 138 140 141  K 3.7 3.8 3.6  CL 104 103 103  CO2 26 29 30   GLUCOSE 149* 140* 144*  BUN 25* 22 19  CREATININE 1.19 1.23 1.14  CALCIUM 9.0 8.9  8.5   Liver Function Tests:  Recent Labs Lab 07/20/14 1537 07/21/14 0539  AST 14 14  ALT 14 13  ALKPHOS 51 41  BILITOT 1.1 1.1  PROT 7.1 6.5  ALBUMIN 3.8 3.4*   No results for input(s): LIPASE, AMYLASE in the last 168 hours. No results for input(s): AMMONIA in the last 168 hours. CBC:  Recent Labs Lab 07/20/14 1537 07/21/14 0539  07/22/14 0607  WBC 20.5* 15.1* 9.8  NEUTROABS 18.2*  --   --   HGB 13.6 12.5* 11.8*  HCT 40.9 38.1* 36.9*  MCV 96.7 97.9 97.4  PLT 191 167 160   Cardiac Enzymes:  Recent Labs Lab 07/20/14 1537  TROPONINI <0.03   BNP (last 3 results)  Recent Labs  07/20/14 1537  BNP 147.0*    ProBNP (last 3 results)  Recent Labs  04/05/14 0621  PROBNP 1106.0*    CBG:  Recent Labs Lab 07/21/14 0754 07/21/14 1206 07/21/14 1659 07/21/14 2217 07/22/14 0755  GLUCAP 136* 186* 198* 164* 141*    Recent Results (from the past 240 hour(s))  Urine culture     Status: None   Collection Time: 07/20/14  4:17 PM  Result Value Ref Range Status   Specimen Description URINE, CATHETERIZED  Final   Special Requests NONE  Final   Colony Count NO GROWTH Performed at [facility]   Final   Culture NO GROWTH Performed at [facility]   Final   Report Status 07/21/2014 FINAL  Final  Culture, blood (routine x 2)     Status: None (Preliminary result)   Collection Time: 07/20/14  7:45 PM  Result Value Ref Range Status   Specimen Description BLOOD LEFT ANTECUBITAL  Final   Special Requests BOTTLES DRAWN AEROBIC AND ANAEROBIC 8CC EACH  Final   Culture NO GROWTH 2 DAYS  Final   Report Status PENDING  Incomplete  Culture, blood (routine x 2)     Status: None (Preliminary result)   Collection Time: 07/20/14  9:39 PM  Result Value Ref Range Status   Specimen Description BLOOD RIGHT ANTECUBITAL  Final   Special Requests   Final    BOTTLES DRAWN AEROBIC AND ANAEROBIC AEB=10CC ANA=8CC   Culture NO GROWTH 2 DAYS  Final   Report Status PENDING  Incomplete  MRSA PCR Screening     Status: None   Collection Time: 07/20/14 10:40 PM  Result Value Ref Range Status   MRSA by PCR NEGATIVE NEGATIVE Final    Comment:        The GeneXpert MRSA Assay (FDA approved for NASAL specimens only), is one component of a comprehensive MRSA colonization surveillance program. It is not intended to  diagnose MRSA infection nor to guide or monitor treatment for MRSA infections.      Studies: Ct Chest Wo Contrast  07/20/2014   CLINICAL DATA:  Further evaluation of pulmonary nodular opacity seen on prior chest radiographs  EXAM: CT CHEST WITHOUT CONTRAST  TECHNIQUE: Multidetector CT imaging of the chest was performed following the standard protocol without IV contrast.  COMPARISON:  MRI thoracic spine 05/29/2012, chest radiograph performed 05/27/2012, 02/05/2013, 04/03/2014, and 07/20/2014  FINDINGS: 12 mm right perihilar circumscribed round mass identified. There is central low attenuation with average attenuation value of -46 in this area. No calcification. This mass is relatively low-attenuation on chest radiographs but can be seen dating back to 2013. There are no other lung nodules.  There is infiltrate in the deep tendon portion of the  right middle lobe. There is consolidation in the inferior medial right middle lobe with air bronchograms. There is consolidation in the posterior medial right lower lobe. There is consolidation in the inferior lingula with air bronchograms and surrounding patchy infiltrate. There is consolidation with air bronchograms in the dependent portion of the left lower lobe. There is also surrounding patchy multinodular infiltrate anterior to the consolidation in the left lower lobe.  No plural effusion. Minimal pericardial thickening. Mild to moderate cardiac enlargement noted. Mild coronary artery calcification.  Mild calcification of the thoracic aorta. Thoracic inlet and thyroid appear normal. Small nonpathologic mediastinal lymph nodes present.  Bilateral adrenal hyperplasia stable from 07/20/2010. Fat containing area in the posterior adrenal gland on the right suggests possibility of mild low lipoma, also stable. 1.5 cm low-attenuation lesion midpole left kidney laterally, larger than previous lesion in this area on 07/20/2010. It is not fully characterized without  contrast but it is likely a cyst.  No acute musculoskeletal findings.  IMPRESSION: 1. 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 This can be seen on prior chest radiographs as well as MRI dating back to 2013. 2. Multifocal bilateral consolidation and infiltrates concerning for pneumonia. Areas of consolidation in the lung bases likely at least in part represent atelectasis, but on the left, there is patchy infiltrate associated with this both in the lower lobe and to a greater degree in the lingula. Similarly, right middle lobe consolidation may in part be due to atelectasis but there is also less consolidated infiltrate that is concerning for pneumonia. 3. 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 4. Enlarged low-attenuation lesion midpole left kidney measuring about 1.8 cm. This is not fully characterized without contrast but is likely a cyst, and this could be confirmed with renal ultrasound.   Electronically Signed   By: Skipper Cliche M.D.   On: 07/20/2014 18:44   Dg Chest Port 1 View  07/21/2014   CLINICAL DATA:  Evaluate pneumonia, cough  EXAM: PORTABLE CHEST - 1 VIEW  COMPARISON:  07/20/2014  FINDINGS: There is mild cardiac enlargement the lung volumes are low and there is pulmonary vascular congestion. Airspace consolidation within the left midlung and left base is noted compatible with pneumonia. Right lung is clear.  IMPRESSION: 1. Persistent left lower lobe and left mid lung pneumonia.   Electronically Signed   By: Kerby Moors M.D.   On: 07/21/2014 15:02   Dg Chest Portable 1 View  07/20/2014   CLINICAL DATA:  Shortness of breath.  Initial encounter.  EXAM: PORTABLE CHEST - 1 VIEW  COMPARISON:  04/03/2014.  FINDINGS: The cardiopericardial silhouette is enlarged. Aortic arch atherosclerosis. Basilar atelectasis knee and airspace disease. The airspace disease is most compatible with pulmonary edema and CHF.   18 mm nodular density is present in the RIGHT perihilar lung which appears to have enlarged compared to the prior exam. Previously, chest CT was recommended the for further evaluation. This recommendation is reiterated. This can be performed after the acute illness.  IMPRESSION: 1. Constellation of findings compatible with CHF. 2. 18 mm nodule in the perihilar RIGHT lung. Followup noncontrast chest CT recommended for further assessment.   Electronically Signed   By: Dereck Ligas M.D.   On: 07/20/2014 16:29    Scheduled Meds: . amLODipine  10 mg Oral Daily  . antiseptic oral rinse  7 mL Mouth Rinse BID  . aspirin EC  81 mg Oral Daily  .  ceFEPime (MAXIPIME) IV  2 g Intravenous Q8H  . citalopram  20 mg Oral Daily  . diazepam  5 mg Oral 3 times per day  . enoxaparin (LOVENOX) injection  70 mg Subcutaneous Q24H  . furosemide  20 mg Intravenous Q12H  . gabapentin  800 mg Oral TID  . guaiFENesin  1,200 mg Oral BID  . insulin aspart  0-9 Units Subcutaneous TID WC  . ipratropium-albuterol  3 mL Nebulization Q4H  . sodium chloride  3 mL Intravenous Q12H  . sodium chloride  3 mL Intravenous Q12H  . vancomycin  1,000 mg Intravenous Q12H   Continuous Infusions:   Principal Problem:   Acute respiratory failure with hypoxia Active Problems:   DM type 2 (diabetes mellitus, type 2)   Tobacco abuse   HTN (hypertension)   Chronic diastolic CHF (congestive heart failure)   Solitary pulmonary nodule   HCAP (healthcare-associated pneumonia)   Pneumonia   Diabetic peripheral neuropathy   Anxiety   PVD (peripheral vascular disease)    Time spent: 35 minutes    [facility] Pager [phone]. If 7PM-7AM, please contact night-coverage at [email or web address], password [facility] 07/22/2014, 10:12 AM  LOS: 2 days

## 2014-07-22 NOTE — Progress Notes (Signed)
Patient complaining of headache that was unrelieved by previous intervention of pain medicine. Patient also stated "he usually takes 10 of valium and 5 is not enough for him". Paged on-call MD, will follow any new orders given.

## 2014-07-23 LAB — GLUCOSE, CAPILLARY
GLUCOSE-CAPILLARY: 148 mg/dL — AB (ref 70–99)
GLUCOSE-CAPILLARY: 142 mg/dL — AB (ref 70–99)
GLUCOSE-CAPILLARY: 206 mg/dL — AB (ref 70–99)
GLUCOSE-CAPILLARY: 161 mg/dL — AB (ref 70–99)
Glucose-Capillary: 189 mg/dL — ABNORMAL HIGH (ref 70–99)

## 2014-07-23 LAB — CBC
HEMATOCRIT: 36.7 % — AB (ref 39.0–52.0)
HEMOGLOBIN: 11.9 g/dL — AB (ref 13.0–17.0)
MCH: 31.5 pg (ref 26.0–34.0)
MCHC: 32.4 g/dL (ref 30.0–36.0)
MCV: 97.1 fL (ref 78.0–100.0)
Platelets: 176 10*3/uL (ref 150–400)
RBC: 3.78 MIL/uL — ABNORMAL LOW (ref 4.22–5.81)
RDW: 14.3 % (ref 11.5–15.5)
WBC: 7.8 10*3/uL (ref 4.0–10.5)

## 2014-07-23 LAB — VANCOMYCIN, TROUGH: Vancomycin Tr: 21.9 ug/mL — ABNORMAL HIGH (ref 10.0–20.0)

## 2014-07-23 LAB — BASIC METABOLIC PANEL
Anion gap: 8 (ref 5–15)
BUN: 19 mg/dL (ref 6–23)
CHLORIDE: 103 mmol/L (ref 96–112)
CO2: 31 mmol/L (ref 19–32)
CREATININE: 1.03 mg/dL (ref 0.50–1.35)
Calcium: 8.3 mg/dL — ABNORMAL LOW (ref 8.4–10.5)
GFR calc Af Amer: 87 mL/min — ABNORMAL LOW (ref >90)
GFR calc non Af Amer: 75 mL/min — ABNORMAL LOW (ref >90)
GLUCOSE: 149 mg/dL — AB (ref 70–99)
Potassium: 3.6 mmol/L (ref 3.5–5.1)
Sodium: 142 mmol/L (ref 135–145)

## 2014-07-23 MED ORDER — VANCOMYCIN HCL IN DEXTROSE 750-5 MG/150ML-% IV SOLN
750.0000 mg | Freq: Two times a day (BID) | INTRAVENOUS | Status: DC
  Administered 2014-07-23 – 2014-07-26 (×7): 750 mg via INTRAVENOUS
  Filled 2014-07-23 (×9): qty 150

## 2014-07-23 MED ORDER — FUROSEMIDE 20 MG PO TABS
20.0000 mg | ORAL_TABLET | Freq: Every day | ORAL | Status: DC
  Administered 2014-07-24 – 2014-07-26 (×3): 20 mg via ORAL
  Filled 2014-07-23 (×3): qty 1

## 2014-07-23 MED ORDER — CEFUROXIME AXETIL 500 MG PO TABS: 500.0000 mg | ORAL_TABLET | Freq: Two times a day (BID) | ORAL | Status: DC

## 2014-07-23 MED ORDER — HYDROCODONE-ACETAMINOPHEN 10-325 MG PO TABS
1.0000 | ORAL_TABLET | Freq: Three times a day (TID) | ORAL | Status: DC | PRN
  Administered 2014-07-23 – 2014-07-26 (×5): 1 via ORAL
  Filled 2014-07-23 (×5): qty 1

## 2014-07-23 MED ORDER — ENOXAPARIN SODIUM 60 MG/0.6ML ~~LOC~~ SOLN
60.0000 mg | SUBCUTANEOUS | Status: DC
  Administered 2014-07-23 – 2014-07-25 (×3): 60 mg via SUBCUTANEOUS
  Filled 2014-07-23 (×3): qty 0.6

## 2014-07-23 MED ORDER — DIAZEPAM 5 MG PO TABS
10.0000 mg | ORAL_TABLET | Freq: Three times a day (TID) | ORAL | Status: DC
  Administered 2014-07-23 – 2014-07-26 (×9): 10 mg via ORAL
  Filled 2014-07-23 (×9): qty 2

## 2014-07-23 NOTE — Progress Notes (Signed)
[place] for Vancomycin and Cefepime  Indication: pneumonia  Allergies  Allergen Reactions  . Cucumber Extract Other (See Comments)    "Fells like I'm having a heart attack"  . Flexeril [Cyclobenzaprine Hcl] Other (See Comments)    "whole body  Tremors" (05/27/2012)  . Kiwi Extract Other (See Comments)    "feels like I'm having a heart attack"    Patient Measurements: Height: 6' (182.9 cm) Weight: 284 lb 13.4 oz (129.2 kg) IBW/kg (Calculated) : 77.6  Vital Signs: Temp: 98.2 F (36.8 C) (02/19 0530) Temp Source: Oral (02/19 0530) BP: 130/65 mmHg (02/19 0530) Pulse Rate: 71 (02/19 0530) Intake/Output from previous day: 02/18 0701 - 02/19 0700 In: 1570 [P.O.:720; IV Piggyback:850] Out: 850 [Urine:850] Intake/Output from this shift: Total I/O In: 120 [P.O.:120] Out: -   Labs:  Recent Labs  07/21/14 0539 07/22/14 0607 07/23/14 0633  WBC 15.1* 9.8 7.8  HGB 12.5* 11.8* 11.9*  PLT 167 160 176  CREATININE 1.23 1.14 1.03   Estimated Creatinine Clearance: 100.6 mL/min (by C-G formula based on Cr of 1.03).  Recent Labs  07/23/14 0851  VANCOTROUGH 21.9*     Microbiology: Recent Results (from the past 720 hour(s))  Urine culture     Status: None   Collection Time: 07/20/14  4:17 PM  Result Value Ref Range Status   Specimen Description URINE, CATHETERIZED  Final   Special Requests NONE  Final   Colony Count NO GROWTH Performed at [facility]   Final   Culture NO GROWTH Performed at [facility]   Final   Report Status 07/21/2014 FINAL  Final  Culture, blood (routine x 2)     Status: None (Preliminary result)   Collection Time: 07/20/14  7:45 PM  Result Value Ref Range Status   Specimen Description BLOOD LEFT ANTECUBITAL  Final   Special Requests BOTTLES DRAWN AEROBIC AND ANAEROBIC 8CC EACH  Final   Culture NO GROWTH 2 DAYS  Final   Report Status PENDING  Incomplete  Culture, blood (routine x 2)     Status:  None (Preliminary result)   Collection Time: 07/20/14  9:39 PM  Result Value Ref Range Status   Specimen Description BLOOD RIGHT ANTECUBITAL  Final   Special Requests   Final    BOTTLES DRAWN AEROBIC AND ANAEROBIC AEB=10CC ANA=8CC   Culture NO GROWTH 2 DAYS  Final   Report Status PENDING  Incomplete  MRSA PCR Screening     Status: None   Collection Time: 07/20/14 10:40 PM  Result Value Ref Range Status   MRSA by PCR NEGATIVE NEGATIVE Final    Comment:        The GeneXpert MRSA Assay (FDA approved for NASAL specimens only), is one component of a comprehensive MRSA colonization surveillance program. It is not intended to diagnose MRSA infection nor to guide or monitor treatment for MRSA infections.     Anti-infectives    Start     Dose/Rate Route Frequency Ordered Stop   07/23/14 1200  vancomycin (VANCOCIN) IVPB 750 mg/150 ml premix     750 mg 150 mL/hr over 60 Minutes Intravenous Every 12 hours 07/23/14 1044     07/21/14 1000  vancomycin (VANCOCIN) IVPB 1000 mg/200 mL premix  Status:  Discontinued     1,000 mg 200 mL/hr over 60 Minutes Intravenous Every 12 hours 07/20/14 2036 07/23/14 1044   07/21/14 0500  ceFEPIme (MAXIPIME) 2 g in dextrose 5 % 50 mL IVPB  2 g 100 mL/hr over 30 Minutes Intravenous Every 8 hours 07/20/14 2036     07/20/14 2100  vancomycin (VANCOCIN) IVPB 1000 mg/200 mL premix     1,000 mg 200 mL/hr over 60 Minutes Intravenous  Once 07/20/14 2029 07/21/14 0055   07/20/14 2030  vancomycin (VANCOCIN) IVPB 1000 mg/200 mL premix  Status:  Discontinued     1,000 mg 200 mL/hr over 60 Minutes Intravenous  Once 07/20/14 1915 07/20/14 1932   07/20/14 1915  vancomycin (VANCOCIN) IVPB 1000 mg/200 mL premix     1,000 mg 200 mL/hr over 60 Minutes Intravenous  Once 07/20/14 1915 07/20/14 2028   07/20/14 1900  ceFEPIme (MAXIPIME) 1 g in dextrose 5 % 50 mL IVPB     1 g 100 mL/hr over 30 Minutes Intravenous  Once 07/20/14 1847 07/20/14 1921     Assessment: 64 yo  obese M admitted from NH with HCAP.   CXR + persistent PNA.  He remains on empiric, broad-spectrum antibiotics. He is currently afebrile & WBC has normalized.  Cx data NGTD. Renal function has improved to patient's baseline. Normalized CrCl ~ 70-75 ml/min.  Vancomycin trough just above desired goal range today.  Vancomycin 2/16>> Cefepime 2/17>>  Goal of Therapy:  Vancomycin trough level 15-20 mcg/ml  Plan:  Decrease Vancomycin 750mg  IV every 12 hours. Continue Cefepime 2gm IV every 8 hours. Recheck Vancomycin trough at steady-state  Monitor renal function and cx data  Duration of therapy per MD- consider d/c Vancomycin if cx remain negative & no clinical suspicion for MRSA PNA.  Biagio Borg 07/23/2014,10:55 AM

## 2014-07-23 NOTE — Evaluation (Signed)
Physical Therapy Evaluation Patient Details Name: Eric Scott MRN: [id number] DOB: November 28, 1950 Today's Date: 07/23/2014   History of Present Illness  Pt is a 64 year old male with a hx of DM, HTN, chronic pain, CVA, peripheral neuropathy.  He is a resident of [facility] and was admitted with pneumonia.  He is primarily w/c dependent but had been able to amblate short distances with a walker and assistanc.e  Clinical Impression   Pt was seen for initial evaluation and found to be alert and oriented, very cooperative.  He has generalized weakness and chronic leg pain with very limited mobility at baseline.  Today he indicated that he would be too weak to transfer OOB but in fact, he was able to stand to a walker and pivot bed to chair with SBA.  His primary problem in shifting his weight in the bed to transfer to sitting.  I believe he will be able to transfer to [facility] at d/c with HHPT.  Pt is agreeable.    Follow Up Recommendations Home health PT    Equipment Recommendations  None recommended by PT    Recommendations for Other Services   none    Precautions / Restrictions Precautions Precautions: Fall Restrictions Weight Bearing Restrictions: No      Mobility  Bed Mobility Overal bed mobility: Needs Assistance Bed Mobility: Supine to Sit     Supine to sit: Max assist;HOB elevated     General bed mobility comments: pt needs assist to reposition buttocks on the bed  Transfers Overall transfer level: Needs assistance Equipment used: Rolling walker (2 wheeled) Transfers: Sit to/from [facility] Sit to Stand: Min assist;From elevated surface Stand pivot transfers: Min guard       General transfer comment: pt used a walker to pivot from bed to chair...good stability  Ambulation/Gait                Stairs            Wheelchair Mobility    Modified Rankin (Stroke Patients Only)       Balance                                              Pertinent Vitals/Pain Pain Assessment: 0-10 Pain Score: 10-Worst pain ever Pain Location: both legs, knee to foot Pain Descriptors / Indicators: Throbbing Pain Intervention(s): Limited activity within patient's tolerance;Repositioned    Home Living Family/patient expects to be discharged to:: Assisted living               Home Equipment: Walker - 2 wheels;Wheelchair - manual      Prior Function Level of Independence: Needs assistance   Gait / Transfers Assistance Needed: some assist needed with transfers and gait with a walker  ADL's / Homemaking Assistance Needed: assist with bathing and dressing        Hand Dominance        Extremity/Trunk Assessment   Upper Extremity Assessment: Generalized weakness           Lower Extremity Assessment: Generalized weakness         Communication   Communication: No difficulties  Cognition Arousal/Alertness: Awake/alert Behavior During Therapy: WFL for tasks assessed/performed Overall Cognitive Status: Within Functional Limits for tasks assessed  General Comments      Exercises        Assessment/Plan    PT Assessment Patient needs continued PT services  PT Diagnosis Difficulty walking;Generalized weakness   PT Problem List Decreased strength;Decreased activity tolerance;Decreased mobility;Pain;Obesity  PT Treatment Interventions Functional mobility training;Therapeutic exercise   PT Goals (Current goals can be found in the Care Plan section) Acute Rehab PT Goals Patient Stated Goal: none stated PT Goal Formulation: With patient Time For Goal Achievement: 08/06/14 Potential to Achieve Goals: Good    Frequency Min 3X/week   Barriers to discharge   none    Co-evaluation               End of Session Equipment Utilized During Treatment: Gait belt;Oxygen Activity Tolerance: Patient tolerated treatment well Patient left: in chair;with call  bell/phone within reach Nurse Communication: Mobility status         Time: [id number] PT Time Calculation (min) (ACUTE ONLY): 52 min   Charges:   PT Evaluation $Initial PT Evaluation Tier I: 1 Procedure     PT G CodesDemetrios Isaacs Scott 07/23/2014, 11:28 AM

## 2014-07-23 NOTE — Plan of Care (Signed)
Problem: Phase I Progression Outcomes Goal: Dyspnea controlled at rest Outcome: Progressing Patient objectively seems to be SOB.  His sats are 94% room air. Will continue to assess.

## 2014-07-23 NOTE — Progress Notes (Signed)
Notified Gweneth Fritter, RPH vanc trough this am was 21.9.  Voiced to her I would hold the vancomycin for now.  She is agreeable.  No new orders given at this time.

## 2014-07-23 NOTE — Clinical Social Work Note (Signed)
CSW updated [place] on pt. Agreeable to return over weekend if stable, but request scripts be sent to pharmacy for any new medications today. MD notified.  Benay Pike, [place]

## 2014-07-23 NOTE — Progress Notes (Signed)
[facility] PROGRESS NOTE  Eric NIEBEL [name] DOB: 1951/01/18 DOA: 07/20/2014 PCP: [name], MD  Assessment/Plan: Acute respiratory failure with hypoxia  Secondary to bilateral pneumonia verified by chest CT in setting of likely COPD and acute on chronic diastolic HF. Continues to improve slowly but steadily. Oxygen saturation level >90% on 4L. Improved cough effort. Continue oxygen supplementation, antibiotics, and nebs. Consider narrowing antibiotics per pharmacy recommendation. If not today certainly tomorrow it continues to improve.   Healthcare associated pneumonia repeat chest xray 07/21/14 with persistent left lower lobe and left mid lung pneumonia. White count remains within limits of normal. Afebrile. Continue vancomycin and cefepime day #4.Strep pneumo antigen and Legionella urinary antigen both negative.  Blood cultures no growth to date. Consider narrowing antibiotics as noted above.    Acute on chronic diastolic heart failure Chest xray concerning for CHF, CT without evidence but clinical findings LE edema. Echo 11/15 with severe LVH, EF 60% and grade 2 diastolic dysfunction. Weight 129.2. Down from 134.8kg. LE edema improving. Will transition lasix back to home po dose. monitor   COPD Ongoing smoker. Reports sob with daily activity at baseline. Not on home oxygen. Continue with DuoNeb nebulizers. On spiriva and nebs at home. May need home oxygen.  Diabetes mellitus A1c 5.7. Appetite remains good. cbg range 142-206. We'll continue sliding scale insulin and adjust as indicated.   ? 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 OP monitoring  HTN Controlled.Home meds include norvasc, lasix. Continuing these. Monitor closely  Diabetic neuropathy Stable  at baseline continue gabapentin  Anxiety Increase valium to home dose as slightly tremulous and reports difficulty sleeping last night. Continue clelexa   Chronic pain Chronic back pain. Home regimen held initially due to #1. Patient more alert with improved respiratory effort. Will resume home pain regimen   Code Status: full Family Communication: none present Disposition Plan: back to facility when ready   Consultants:  none  Procedures:  none  Antibiotics:  Vancomycin 07/20/14>>  Cefepime 07/20/14>>  HPI/Subjective: Up to chair. Reports feeling some better. Complains of back pain and requesting home pain meds  Objective: Filed Vitals:   07/23/14 0530  BP: 130/65  Pulse: 71  Temp: 98.2 F (36.8 C)  Resp: 21    Intake/Output Summary (Last 24 hours) at 07/23/14 1215 Last data filed at 07/23/14 0900  Gross per 24 hour  Intake   1450 ml  Output    850 ml  Net    600 ml   Filed Weights   07/22/14 0602 07/22/14 1054 07/23/14 0530  Weight: 112.7 kg (248 lb 7.3 oz) 130.6 kg (287 lb 14.7 oz) 129.2 kg (284 lb 13.4 oz)    Exam:   General:  Obese looking better with improved color  Cardiovascular: RRR no m/g/r 1+LE edema with venous stasis changes bilaterally  Respiratory: normal effort with conversation. BS distant but clear. No wheeze  Abdomen: obese soft non-tender to palpation no guarding no rebounding  Musculoskeletal: joints without swelling/erythema   Data Reviewed: Basic Metabolic Panel:  Recent Labs Lab 07/20/14 1537 07/21/14 0539 07/22/14 0607 07/23/14 0633  NA 138 140 141 142  K 3.7 3.8 3.6 3.6  CL 104 103 103 103  CO2 26 29 30 31   GLUCOSE 149* 140* 144* 149*  BUN 25* 22 19 19   CREATININE  1.19 1.23 1.14 1.03  CALCIUM 9.0 8.9 8.5 8.3*   Liver Function Tests:  Recent Labs Lab 07/20/14 1537 07/21/14 0539  AST 14 14  ALT 14 13  ALKPHOS 51 41  BILITOT 1.1 1.1  PROT 7.1 6.5  ALBUMIN 3.8 3.4*   No results for input(s): LIPASE,  AMYLASE in the last 168 hours. No results for input(s): AMMONIA in the last 168 hours. CBC:  Recent Labs Lab 07/20/14 1537 07/21/14 0539 07/22/14 0607 07/23/14 0633  WBC 20.5* 15.1* 9.8 7.8  NEUTROABS 18.2*  --   --   --   HGB 13.6 12.5* 11.8* 11.9*  HCT 40.9 38.1* 36.9* 36.7*  MCV 96.7 97.9 97.4 97.1  PLT 191 167 160 176   Cardiac Enzymes:  Recent Labs Lab 07/20/14 1537  TROPONINI <0.03   BNP (last 3 results)  Recent Labs  07/20/14 1537  BNP 147.0*    ProBNP (last 3 results)  Recent Labs  04/05/14 0621  PROBNP 1106.0*    CBG:  Recent Labs Lab 07/22/14 1155 07/22/14 1648 07/22/14 2128 07/23/14 0747 07/23/14 1130  GLUCAP 229* 184* 199* 142* 206*    Recent Results (from the past 240 hour(s))  Urine culture     Status: None   Collection Time: 07/20/14  4:17 PM  Result Value Ref Range Status   Specimen Description URINE, CATHETERIZED  Final   Special Requests NONE  Final   Colony Count NO GROWTH Performed at [facility]   Final   Culture NO GROWTH Performed at [facility]   Final   Report Status 07/21/2014 FINAL  Final  Culture, blood (routine x 2)     Status: None (Preliminary result)   Collection Time: 07/20/14  7:45 PM  Result Value Ref Range Status   Specimen Description BLOOD LEFT ANTECUBITAL  Final   Special Requests BOTTLES DRAWN AEROBIC AND ANAEROBIC 8CC EACH  Final   Culture NO GROWTH 2 DAYS  Final   Report Status PENDING  Incomplete  Culture, blood (routine x 2)     Status: None (Preliminary result)   Collection Time: 07/20/14  9:39 PM  Result Value Ref Range Status   Specimen Description BLOOD RIGHT ANTECUBITAL  Final   Special Requests   Final    BOTTLES DRAWN AEROBIC AND ANAEROBIC AEB=10CC ANA=8CC   Culture NO GROWTH 2 DAYS  Final   Report Status PENDING  Incomplete  MRSA PCR Screening     Status: None   Collection Time: 07/20/14 10:40 PM  Result Value Ref Range Status   MRSA by PCR NEGATIVE NEGATIVE Final     Comment:        The GeneXpert MRSA Assay (FDA approved for NASAL specimens only), is one component of a comprehensive MRSA colonization surveillance program. It is not intended to diagnose MRSA infection nor to guide or monitor treatment for MRSA infections.      Studies: Dg Chest Port 1 View  07/21/2014   CLINICAL DATA:  Evaluate pneumonia, cough  EXAM: PORTABLE CHEST - 1 VIEW  COMPARISON:  07/20/2014  FINDINGS: There is mild cardiac enlargement the lung volumes are low and there is pulmonary vascular congestion. Airspace consolidation within the left midlung and left base is noted compatible with pneumonia. Right lung is clear.  IMPRESSION: 1. Persistent left lower lobe and left mid lung pneumonia.   Electronically Signed   By: Kerby Moors M.D.   On: 07/21/2014 15:02    Scheduled Meds: . amLODipine  10 mg  Oral Daily  . antiseptic oral rinse  7 mL Mouth Rinse BID  . aspirin EC  81 mg Oral Daily  . ceFEPime (MAXIPIME) IV  2 g Intravenous Q8H  . citalopram  20 mg Oral Daily  . diazepam  10 mg Oral 3 times per day  . enoxaparin (LOVENOX) injection  60 mg Subcutaneous Q24H  . [START ON 07/24/2014] furosemide  20 mg Oral Daily  . gabapentin  800 mg Oral TID  . guaiFENesin  1,200 mg Oral BID  . insulin aspart  0-9 Units Subcutaneous TID WC  . ipratropium-albuterol  3 mL Nebulization Q4H  . sodium chloride  3 mL Intravenous Q12H  . sodium chloride  3 mL Intravenous Q12H  . vancomycin  750 mg Intravenous Q12H   Continuous Infusions:   Principal Problem:   Acute respiratory failure with hypoxia Active Problems:   DM type 2 (diabetes mellitus, type 2)   Tobacco abuse   HTN (hypertension)   Chronic pain (back, legs)   Chronic diastolic CHF (congestive heart failure)   Solitary pulmonary nodule   HCAP (healthcare-associated pneumonia)   Pneumonia   Diabetic peripheral neuropathy   Anxiety   PVD (peripheral vascular disease)    Time spent: 35 minutes    [facility] Pager [phone]. If 7PM-7AM, please contact night-coverage at [email or web address], password [facility] 07/23/2014, 12:15 PM  LOS: 3 days

## 2014-07-24 DIAGNOSIS — F419 Anxiety disorder, unspecified: Secondary | ICD-10-CM

## 2014-07-24 LAB — GLUCOSE, CAPILLARY
GLUCOSE-CAPILLARY: 209 mg/dL — AB (ref 70–99)
Glucose-Capillary: 141 mg/dL — ABNORMAL HIGH (ref 70–99)
Glucose-Capillary: 178 mg/dL — ABNORMAL HIGH (ref 70–99)
Glucose-Capillary: 188 mg/dL — ABNORMAL HIGH (ref 70–99)

## 2014-07-24 MED ORDER — INSULIN ASPART 100 UNIT/ML ~~LOC~~ SOLN
0.0000 [IU] | Freq: Three times a day (TID) | SUBCUTANEOUS | Status: DC
  Administered 2014-07-25: 3 [IU] via SUBCUTANEOUS
  Administered 2014-07-25: 1 [IU] via SUBCUTANEOUS
  Administered 2014-07-25: 2 [IU] via SUBCUTANEOUS
  Administered 2014-07-26 (×2): 1 [IU] via SUBCUTANEOUS

## 2014-07-24 MED ORDER — ALBUTEROL SULFATE (2.5 MG/3ML) 0.083% IN NEBU: 2.5000 mg | INHALATION_SOLUTION | Freq: Four times a day (QID) | RESPIRATORY_TRACT | Status: DC | PRN

## 2014-07-24 MED ORDER — INSULIN ASPART 100 UNIT/ML ~~LOC~~ SOLN
0.0000 [IU] | Freq: Every day | SUBCUTANEOUS | Status: DC
  Administered 2014-07-24 – 2014-07-25 (×2): 2 [IU] via SUBCUTANEOUS

## 2014-07-24 MED ORDER — IPRATROPIUM-ALBUTEROL 0.5-2.5 (3) MG/3ML IN SOLN
3.0000 mL | Freq: Four times a day (QID) | RESPIRATORY_TRACT | Status: DC
  Administered 2014-07-24 – 2014-07-25 (×5): 3 mL via RESPIRATORY_TRACT
  Filled 2014-07-24 (×5): qty 3

## 2014-07-24 NOTE — Progress Notes (Signed)
PROGRESS NOTE  Eric Scott [name] DOB: [birth year] DOA: 07/20/2014 PCP: [name], MD  Summary: 64 year old man from [place] presented with shortness of breath, found to have acute hypoxic respiratory failure, multifocal pneumonia on chest CT; admitted for healthcare associated pneumonia, acute on chronic diastolic congestive heart failure, acute hypoxic respiratory failure.  Assessment/Plan: 1. Acute hypoxic respiratory failure. Improving. 2. HCAP. Continue abx.  3. Acute on chronic diastolic CHF, acute resolved. Appears stable.  4. COPD, smoker. Continue Duonebs and PRN albuterol 5. Tobacco dependence. PPD. Recommend cessation. 6. DM type 2 with peripheral neuropathy, stable. Hgb A1c 5.7. Continue gabapentin. 7. 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 8. Anxiety. Stable. [place]-standing Valuim use.   Stable, but clinically not as good as yesterday. Plan to continue IV abx another 24 hours and reassess in AM  Continue PO Lasix  Continue bronchodilators. No steroids at this point.  Code Status: full code DVT prophylaxis: Lovenox Family Communication: none present Disposition Plan: return to ALF with HH PT  Murray Hodgkins, MD  [facility]  Pager [phone] If 7PM-7AM, please contact night-coverage at [email or web address], password [facility] 07/24/2014, 9:59 AM  LOS: 4 days   Consultants:    Procedures:    Antibiotics:  Vancomycin 07/20/14>>  Cefepime 07/20/14>>  HPI/Subjective: Doesn't feel as good today as yesterday; nothing can put finger on. Fatigued, somewhat short of breath. Eating well, no n/v. No pain.  Objective: Filed Vitals:   07/23/14 2104 07/23/14 2344 07/24/14 0627 07/24/14 0652  BP: 126/72  124/52   Pulse: 76  73   Temp: 98.1 F (36.7 C)  98.7 F (37.1 C)   TempSrc: Oral  Oral   Resp: 20  20   Height:      Weight:   127.9 kg (281 lb 15.5 oz)     SpO2: 96% 96% 94% 92%    Intake/Output Summary (Last 24 hours) at 07/24/14 0959 Last data filed at 07/24/14 0855  Gross per 24 hour  Intake    493 ml  Output      0 ml  Net    493 ml     Filed Weights   07/22/14 1054 07/23/14 0530 07/24/14 0627  Weight: 130.6 kg (287 lb 14.7 oz) 129.2 kg (284 lb 13.4 oz) 127.9 kg (281 lb 15.5 oz)    Exam:     Afebrile, VSS General: Appears calm and mildy uncomfortable; not toxic; a little worse overall in appearance Cardiovascular: RRR, no m/r/g. Minimal LLE edema, much improved. Respiratory: fair air movement, some expiratory wheeze bilaterally, mild increased respiratory effort. No rhonchi or rales. Speaks in full sentences Musculoskeletal: grossly normal tone BUE/BLE. Moves both extremities to command Psychiatric: grossly normal mood and affect, speech fluent and appropriate  Data Reviewed:  Blood sugars stable  BC pending  Scheduled Meds: . amLODipine  10 mg Oral Daily  . antiseptic oral rinse  7 mL Mouth Rinse BID  . aspirin EC  81 mg Oral Daily  . ceFEPime (MAXIPIME) IV  2 g Intravenous Q8H  . citalopram  20 mg Oral Daily  . diazepam  10 mg Oral 3 times per day  . enoxaparin (LOVENOX) injection  60 mg Subcutaneous Q24H  . furosemide  20 mg Oral Daily  . gabapentin  800 mg Oral TID  . guaiFENesin  1,200 mg Oral BID  . insulin aspart  0-9 Units Subcutaneous TID WC  . ipratropium-albuterol  3 mL Nebulization QID  .  sodium chloride  3 mL Intravenous Q12H  . sodium chloride  3 mL Intravenous Q12H  . vancomycin  750 mg Intravenous Q12H   Continuous Infusions:   Principal Problem:   Acute respiratory failure with hypoxia Active Problems:   DM type 2 (diabetes mellitus, type 2)   Tobacco abuse   HTN (hypertension)   Chronic pain (back, legs)   Chronic diastolic CHF (congestive heart failure)   Solitary pulmonary nodule   HCAP (healthcare-associated pneumonia)   Pneumonia   Diabetic peripheral neuropathy   Anxiety   PVD  (peripheral vascular disease)   Time spent 20 minutes

## 2014-07-25 DIAGNOSIS — E1342 Other specified diabetes mellitus with diabetic polyneuropathy: Secondary | ICD-10-CM

## 2014-07-25 DIAGNOSIS — G629 Polyneuropathy, unspecified: Secondary | ICD-10-CM

## 2014-07-25 LAB — CULTURE, BLOOD (ROUTINE X 2)
Culture: NO GROWTH
Culture: NO GROWTH

## 2014-07-25 LAB — GLUCOSE, CAPILLARY
GLUCOSE-CAPILLARY: 146 mg/dL — AB (ref 70–99)
GLUCOSE-CAPILLARY: 220 mg/dL — AB (ref 70–99)
Glucose-Capillary: 207 mg/dL — ABNORMAL HIGH (ref 70–99)
Glucose-Capillary: 174 mg/dL — ABNORMAL HIGH (ref 70–99)

## 2014-07-25 MED ORDER — POLYETHYLENE GLYCOL 3350 17 G PO PACK
17.0000 g | PACK | Freq: Two times a day (BID) | ORAL | Status: DC
  Administered 2014-07-25 – 2014-07-26 (×3): 17 g via ORAL
  Filled 2014-07-25 (×3): qty 1

## 2014-07-25 MED ORDER — SENNA 8.6 MG PO TABS
1.0000 | ORAL_TABLET | Freq: Every day | ORAL | Status: DC
Start: 2014-07-25 — End: 2014-07-26
  Administered 2014-07-25: 8.6 mg via ORAL
  Filled 2014-07-25: qty 1

## 2014-07-25 MED ORDER — TIOTROPIUM BROMIDE MONOHYDRATE 18 MCG IN CAPS
18.0000 ug | ORAL_CAPSULE | Freq: Every day | RESPIRATORY_TRACT | Status: DC
  Administered 2014-07-26: 18 ug via RESPIRATORY_TRACT
  Filled 2014-07-25: qty 5

## 2014-07-25 MED ORDER — MOMETASONE FURO-FORMOTEROL FUM 100-5 MCG/ACT IN AERO
2.0000 | INHALATION_SPRAY | Freq: Two times a day (BID) | RESPIRATORY_TRACT | Status: DC
  Administered 2014-07-25 – 2014-07-26 (×2): 2 via RESPIRATORY_TRACT
  Filled 2014-07-25: qty 8.8

## 2014-07-25 NOTE — Progress Notes (Signed)
Pt BP 127/49, held AM dose of amlodipine.  Will continue to monitor.

## 2014-07-25 NOTE — Progress Notes (Signed)
PROGRESS NOTE  Eric Scott [id number] DOB: 01/23/1951 DOA: 07/20/2014 PCP: [name], MD  Summary: 64 year old man from [place] presented with shortness of breath, found to have acute hypoxic respiratory failure, multifocal pneumonia on chest CT; admitted for healthcare associated pneumonia, acute on chronic diastolic congestive heart failure, acute hypoxic respiratory failure.  Assessment/Plan: 1. Acute hypoxic respiratory failure. Improved, may need home oxygen on discharge. 2. HCAP. Continues to improve, afebrile, VSS. 3. Acute on chronic diastolic CHF, now compensated. Weights unreliable. I/O even since admission. 4. COPD, smoker. Stable. Continue Duonebs and PRN albuterol. 5. DM type 2 with peripheral neuropathy. Hgb A1c 5.7. Continue gabapentin. Remains stable.  6. 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 7. Anxiety. [place]-standing Valuim use. Stable. 8. Tobacco dependence. 1 PPD. Recommend cessation. 9. Left patellar pain, no evidence of acute issues. Follow clinically.   Overall improving. Completes abx 2/22.  Check SpO2 in AM to determine need for home oxygen  Anticipate discharge 2/22.  Code Status: full code DVT prophylaxis: Lovenox Family Communication: none present Disposition Plan: return to ALF with HH PT  Murray Hodgkins, MD  [facility]  Pager [phone] If 7PM-7AM, please contact night-coverage at [email or web address], password [facility] 07/25/2014, 9:54 AM  LOS: 5 days   Consultants:    Procedures:    Antibiotics:  Vancomycin 07/20/14>> 2/22  Cefepime 07/20/14>> 2/22  HPI/Subjective: Doesn't feel well, nothing can put finger on. No n/v/abd pain. Poor appetite. Had oatmeal this AM. Chronic BLE neuropathic pain. Breathing ok, not SOB. Some productive cough. Reports new left patellar pain, no injury.  Objective: Filed Vitals:   07/24/14 2006 07/24/14 2044  07/25/14 0505 07/25/14 0723  BP:  141/67 127/49   Pulse:  85 81   Temp:  98.4 F (36.9 C) 98.2 F (36.8 C)   TempSrc:  Oral Oral   Resp:  20 20   Height:      Weight:   126.081 kg (277 lb 15.3 oz)   SpO2: 89% 94% 92% 94%    Intake/Output Summary (Last 24 hours) at 07/25/14 0954 Last data filed at 07/25/14 0600  Gross per 24 hour  Intake    570 ml  Output   1100 ml  Net   -530 ml     Filed Weights   07/23/14 0530 07/24/14 0627 07/25/14 0505  Weight: 129.2 kg (284 lb 13.4 oz) 127.9 kg (281 lb 15.5 oz) 126.081 kg (277 lb 15.3 oz)    Exam:     Afebrile, VSS General:  Appears calm and comfortable, tired but non-toxic Cardiovascular: RRR, no m/r/g. No LE edema. Respiratory: CTA bilaterally, no w/r/r. Normal respiratory effort. Fair air movement, speaks in full sentences. Musculoskeletal: generally weak. Left knee appears unremarkable, no erythema, no calor. Patella with point tenderness mid-superior aspect, size <1cm, no hyperlaxity. No surrounding pain, no joint line pain. Psychiatric: grossly normal mood and affect, speech fluent and appropriate  Data Reviewed:  Blood sugars remain stable  BC NG, final.  Scheduled Meds: . amLODipine  10 mg Oral Daily  . antiseptic oral rinse  7 mL Mouth Rinse BID  . aspirin EC  81 mg Oral Daily  . ceFEPime (MAXIPIME) IV  2 g Intravenous Q8H  . citalopram  20 mg Oral Daily  . diazepam  10 mg Oral 3 times per day  . enoxaparin (LOVENOX) injection  60 mg Subcutaneous Q24H  . furosemide  20 mg Oral Daily  . gabapentin  800  mg Oral TID  . guaiFENesin  1,200 mg Oral BID  . insulin aspart  0-5 Units Subcutaneous QHS  . insulin aspart  0-9 Units Subcutaneous TID WC  . ipratropium-albuterol  3 mL Nebulization QID  . sodium chloride  3 mL Intravenous Q12H  . sodium chloride  3 mL Intravenous Q12H  . vancomycin  750 mg Intravenous Q12H   Continuous Infusions:   Principal Problem:   Acute respiratory failure with hypoxia Active  Problems:   DM type 2 (diabetes mellitus, type 2)   Tobacco abuse   HTN (hypertension)   Chronic pain (back, legs)   Chronic diastolic CHF (congestive heart failure)   Solitary pulmonary nodule   HCAP (healthcare-associated pneumonia)   Pneumonia   Diabetic peripheral neuropathy   Anxiety   PVD (peripheral vascular disease)   Time spent 20 minutes

## 2014-07-25 NOTE — Progress Notes (Addendum)
Pt's BP 127/49 with a HR of 81. MD was made aware and will continue to monitor

## 2014-07-26 LAB — BASIC METABOLIC PANEL
ANION GAP: 8 (ref 5–15)
BUN: 17 mg/dL (ref 6–23)
CALCIUM: 8.6 mg/dL (ref 8.4–10.5)
CO2: 27 mmol/L (ref 19–32)
Chloride: 106 mmol/L (ref 96–112)
Creatinine, Ser: 0.84 mg/dL (ref 0.50–1.35)
GFR calc non Af Amer: >90 mL/min (ref >90)
Glucose, Bld: 162 mg/dL — ABNORMAL HIGH (ref 70–99)
Potassium: 3.7 mmol/L (ref 3.5–5.1)
Sodium: 141 mmol/L (ref 135–145)

## 2014-07-26 LAB — GLUCOSE, CAPILLARY
Glucose-Capillary: 141 mg/dL — ABNORMAL HIGH (ref 70–99)
Glucose-Capillary: 186 mg/dL — ABNORMAL HIGH (ref 70–99)

## 2014-07-26 MED ORDER — DIAZEPAM 10 MG PO TABS: 10.0000 mg | ORAL_TABLET | Freq: Three times a day (TID) | ORAL | Status: DC

## 2014-07-26 MED ORDER — HYDROCODONE-ACETAMINOPHEN 10-325 MG PO TABS: 1.0000 | ORAL_TABLET | Freq: Three times a day (TID) | ORAL | Status: DC | PRN

## 2014-07-26 NOTE — Progress Notes (Signed)
Patient's room air saturation was 94 percent. No c/o pain or discomfort noted. Will continue to monitor.

## 2014-07-26 NOTE — Progress Notes (Signed)
Inpatient Diabetes Program Recommendations  AACE/ADA: New Consensus Statement on Inpatient Glycemic Control (2013)  Target Ranges:  Prepandial:   less than 140 mg/dL      Peak postprandial:   less than 180 mg/dL (1-2 hours)      Critically ill patients:  140 - 180 mg/dL   Results for KARREEM, CAN (MRN [id number]) as of 07/26/2014 07:51  Ref. Range 07/25/2014 07:22 07/25/2014 11:30 07/25/2014 16:12 07/25/2014 20:43  Glucose-Capillary Latest Range: 70-99 mg/dL 146 (H) 207 (H) 174 (H) 220 (H)    Diabetes history: DM2 Outpatient Diabetes medications: Amaryl 2 mg QAM, Metformin 1000 mg BID Current orders for Inpatient glycemic control: Novolog 0-9 units TID with meals, Novolog 0-5 units HS  Inpatient Diabetes Program Recommendations Correction (SSI): Please consider increasing Novolog correction to moderate scale.  Thanks, Barnie Alderman, RN, MSN, CCRN, CDE Diabetes Coordinator Inpatient Diabetes Program [phone] (Team Pager) [phone] ([facility] office) [phone] [facility] office)

## 2014-07-26 NOTE — Progress Notes (Signed)
Patient discharged back to [facility]. Vital signs stable. No c/o pain or discomfort. Accompanied by staff to an awaiting vehicle.

## 2014-07-26 NOTE — Progress Notes (Signed)
UR chart review completed.  

## 2014-07-26 NOTE — Progress Notes (Signed)
Patient ambulated with front wheel walker in room,patient made 3 to 4 steps,oxygen saturation dropped to 86-87 percent while ambulating,patient stopped moving and oxygen saturation went back up to 93 percent. Patient is very deconditioned,and weak, unable to walk any further,patient was assisted to chair. Shortness of breath noted with exertion. Dyanne Carrel NP notified. Will continue to monitor patient.

## 2014-07-26 NOTE — Clinical Social Work Note (Signed)
Pt d/c today back to [facility]. Pt and facility aware and agreeable. Pt requests that CSW not contact his son as he is sleeping. Facility to provide transport. D/C summary and FL2 faxed.  Benay Pike, [place]

## 2014-07-26 NOTE — Discharge Summary (Signed)
Physician Discharge Summary  Eric Scott [name] DOB: 1950-06-12 DOA: 07/20/2014  PCP: [name], MD  Admit date: 07/20/2014 Discharge date: 07/26/2014  Time spent: 40 minutes  Recommendations for Outpatient Follow-up:  1. PCP 1 week for evaluation of respiratory status. Consider PFT when acute illness resolved. Will need repeat CT chest 3 months to evaluate right adrenal gland 2. HH PT  Discharge Diagnoses:  Principal Problem:   Acute respiratory failure with hypoxia Active Problems:   DM type 2 (diabetes mellitus, type 2)   Tobacco abuse   HTN (hypertension)   Chronic pain (back, legs)   Chronic diastolic CHF (congestive heart failure)   Solitary pulmonary nodule   HCAP (healthcare-associated pneumonia)   Pneumonia   Diabetic peripheral neuropathy   Anxiety   PVD (peripheral vascular disease)   Discharge Condition: back to assisted living  Diet recommendation: carb modified  Filed Weights   07/23/14 0530 07/24/14 0627 07/25/14 0505  Weight: 129.2 kg (284 lb 13.4 oz) 127.9 kg (281 lb 15.5 oz) 126.081 kg (277 lb 15.3 oz)    History of present illness:  64 year old male who  has a past medical history of Hypertension; Hypercholesteremia; Type II diabetes mellitus; Diabetic peripheral neuropathy; Stroke (05/2012); Anxiety; Spinal stenosis; Chronic pain; Depression; PVD (peripheral vascular disease); Peripheral edema; CHF (congestive heart failure); COPD (chronic obstructive pulmonary disease); and Tremor sent to the ED on 07/20/14 from skilled facility [facility], with chief complaint of gradual onset of worsening shortness of breath, persistent cough for 1 week. Patient with history of COPD and chronic diastolic heart failure. He had been taking Lasix 20 g daily. He denied chest pain. Had low-grade fever 100.5 in the ED. He denied dysuria. Chest x-ray showed congestive heart failure and CT chest was recommended for possible nodule which showed bilateral pneumonia.  Patient did receive 1 dose of Lasix in the ED 40 mg IV 1. BNP was 147.0, 2-D echocardiogram from 2015 showed grade 2 diastolic dysfunction. Patient is a smoker and smokes 1 pack per day. He has a history of COPD. He denied nausea vomiting or diarrhea. No abdominal pain.   Hospital Course:  Acute respiratory failure with hypoxia  Secondary to bilateral pneumonia verified by chest CT in setting of likely COPD and acute on chronic diastolic HF.  Provided with nebs, oxygen, antibiotics and improved slowly. Oxygen saturation level 94% at rest on room air at discharge. Marland Kitchen   Healthcare associated pneumonia repeat chest xray 07/21/14 with persistent left lower lobe and left mid lung pneumonia.  White count within limits of normal and he is afebrile at discharge. Provided with  Vancomycin for 6 days and cefepime for 7 days.Strep pneumo antigen and Legionella urinary antigen both negative. Blood cultures no growth to date.    Acute on chronic diastolic heart failure Chest xray concerning for CHF, CT without evidence but clinical findings LE edema. Echo 11/15 with severe LVH, EF 60% and grade 2 diastolic dysfunction. Weight 126kg on discharge. Down from 134.8kg. LE edema at baseline   COPD Ongoing smoker. Reports sob with daily activity at baseline. Not on home oxygen. Continue home   Diabetes mellitus A1c 5.7. Appetite remained good. Resume home regimen at discharge   ? 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 OP monitoring  HTN Controlled.Home meds include norvasc, lasix. Continuing these. Monitor  closely  Diabetic neuropathy Stable at baseline continue gabapentin  Anxiety Home med resumed   Chronic pain Chronic back pain. Home regimen held initially due to #1. Then home regimen  resumed   Procedures:  none  Consultations:  none  Discharge Exam: Filed Vitals:   07/26/14 0550  BP: 125/58  Pulse: 69  Temp: 98.5 F (36.9 C)  Resp: 20    General: sitting up in bed eating. Denies pain/discomfort Cardiovascular: RRR no MGR no LE edema Respiratory: normal effort. BS with improved air movement. No wheeze no crackles  Discharge Instructions    Current Discharge Medication List    CONTINUE these medications which have CHANGED   Details  diazepam (VALIUM) 10 MG tablet Take 1 tablet (10 mg total) by mouth every 8 (eight) hours. Qty: 30 tablet, Refills: 0    HYDROcodone-acetaminophen (NORCO) 10-325 MG per tablet Take 1 tablet by mouth every 8 (eight) hours as needed for severe pain. Qty: 30 tablet, Refills: 0      CONTINUE these medications which have NOT CHANGED   Details  amLODipine (NORVASC) 5 MG tablet Take 2 tablets (10 mg total) by mouth daily.    aspirin EC 81 MG tablet Take 81 mg by mouth daily.    atorvastatin (LIPITOR) 20 MG tablet Take 20 mg by mouth daily.     calcium-vitamin D (OSCAL WITH D) 500-200 MG-UNIT per tablet Take 1 tablet by mouth 3 (three) times daily.    citalopram (CELEXA) 20 MG tablet Take 20 mg by mouth daily.    fish oil-omega-3 fatty acids 1000 MG capsule Take 1 g by mouth 2 (two) times daily.    Fluticasone-Salmeterol (ADVAIR) 250-50 MCG/DOSE AEPB Inhale 1 puff into the lungs every 12 (twelve) hours.    furosemide (LASIX) 20 MG tablet Take 20 mg by mouth daily.    gabapentin (NEURONTIN) 800 MG tablet Take 800 mg by mouth 3 (three) times daily.    glimepiride (AMARYL) 2 MG tablet Take 2 mg by mouth daily with breakfast.    metFORMIN (GLUCOPHAGE) 500 MG tablet Take 1,000 mg by mouth 2 (two) times daily with a meal.    polyethylene glycol (MIRALAX) packet Take 17 g by mouth daily. Qty: 14 each, Refills: 0    tiotropium (SPIRIVA) 18 MCG inhalation capsule Place 18 mcg into inhaler and inhale daily.     AMBULATORY NON FORMULARY MEDICATION Oxycontin 10mg  tablet SA Sig: Take one tablet by mouth every 12 hours for pain. Hold for sedation. Do not crush Qty: 60 tablet, Refills: 0    ipratropium-albuterol (DUONEB) 0.5-2.5 (3) MG/3ML SOLN Take 3 mLs by nebulization every 6 (six) hours. Qty: 360 mL      STOP taking these medications     doxycycline (VIBRAMYCIN) 100 MG capsule      Oxycodone HCl 10 MG TABS      predniSONE (DELTASONE) 10 MG tablet        Allergies  Allergen Reactions  . Cucumber Extract Other (See Comments)    "Fells like I'm having a heart attack"  . Flexeril [Cyclobenzaprine Hcl] Other (See Comments)    "whole body  Tremors" (05/27/2012)  . Kiwi Extract Other (See Comments)    "feels like I'm having a heart attack"   Follow-up Information    Follow up with [facility].   Contact information:   [address] [place] [address] [phone]       Follow up with [facility], MD. Schedule an appointment as  soon as possible for a visit in 1 week.   Specialty:  Internal Medicine   Why:  evlauation of respiratory status   Contact information:   [place] [id number] [phone]        The results of significant diagnostics from this hospitalization (including imaging, microbiology, ancillary and laboratory) are listed below for reference.    Significant Diagnostic Studies: Ct Chest Wo Contrast  07/20/2014   CLINICAL DATA:  Further evaluation of pulmonary nodular opacity seen on prior chest radiographs  EXAM: CT CHEST WITHOUT CONTRAST  TECHNIQUE: Multidetector CT imaging of the chest was performed following the standard protocol without IV contrast.  COMPARISON:  MRI thoracic spine 05/29/2012, chest radiograph performed 05/27/2012, 02/05/2013, 04/03/2014, and 07/20/2014  FINDINGS: 12 mm right perihilar circumscribed round mass identified. There is central low attenuation with average attenuation value of -46 in  this area. No calcification. This mass is relatively low-attenuation on chest radiographs but can be seen dating back to 2013. There are no other lung nodules.  There is infiltrate in the deep tendon portion of the right middle lobe. There is consolidation in the inferior medial right middle lobe with air bronchograms. There is consolidation in the posterior medial right lower lobe. There is consolidation in the inferior lingula with air bronchograms and surrounding patchy infiltrate. There is consolidation with air bronchograms in the dependent portion of the left lower lobe. There is also surrounding patchy multinodular infiltrate anterior to the consolidation in the left lower lobe.  No plural effusion. Minimal pericardial thickening. Mild to moderate cardiac enlargement noted. Mild coronary artery calcification.  Mild calcification of the thoracic aorta. Thoracic inlet and thyroid appear normal. Small nonpathologic mediastinal lymph nodes present.  Bilateral adrenal hyperplasia stable from 07/20/2010. Fat containing area in the posterior adrenal gland on the right suggests possibility of mild low lipoma, also stable. 1.5 cm low-attenuation lesion midpole left kidney laterally, larger than previous lesion in this area on 07/20/2010. It is not fully characterized without contrast but it is likely a cyst.  No acute musculoskeletal findings.  IMPRESSION: 1. 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 This can be seen on prior chest radiographs as well as MRI dating back to 2013. 2. Multifocal bilateral consolidation and infiltrates concerning for pneumonia. Areas of consolidation in the lung bases likely at least in part represent atelectasis, but on the left, there is patchy infiltrate associated with this both in the lower lobe and to a greater degree in the lingula. Similarly, right middle lobe consolidation may in part be due to atelectasis but there is also less  consolidated infiltrate that is concerning for pneumonia. 3. 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 4. Enlarged low-attenuation lesion midpole left kidney measuring about 1.8 cm. This is not fully characterized without contrast but is likely a cyst, and this could be confirmed with renal ultrasound.   Electronically Signed   By: Skipper Cliche M.D.   On: 07/20/2014 18:44   Dg Chest Port 1 View  07/21/2014   CLINICAL DATA:  Evaluate pneumonia, cough  EXAM: PORTABLE CHEST - 1 VIEW  COMPARISON:  07/20/2014  FINDINGS: There is mild cardiac enlargement the lung volumes are low and there is pulmonary vascular congestion. Airspace consolidation within the left midlung and left base is noted compatible with pneumonia. Right lung is clear.  IMPRESSION: 1. Persistent left lower lobe and left mid lung pneumonia.   Electronically Signed  By: Kerby Moors M.D.   On: 07/21/2014 15:02   Dg Chest Portable 1 View  07/20/2014   CLINICAL DATA:  Shortness of breath.  Initial encounter.  EXAM: PORTABLE CHEST - 1 VIEW  COMPARISON:  04/03/2014.  FINDINGS: The cardiopericardial silhouette is enlarged. Aortic arch atherosclerosis. Basilar atelectasis knee and airspace disease. The airspace disease is most compatible with pulmonary edema and CHF.  18 mm nodular density is present in the RIGHT perihilar lung which appears to have enlarged compared to the prior exam. Previously, chest CT was recommended the for further evaluation. This recommendation is reiterated. This can be performed after the acute illness.  IMPRESSION: 1. Constellation of findings compatible with CHF. 2. 18 mm nodule in the perihilar RIGHT lung. Followup noncontrast chest CT recommended for further assessment.   Electronically Signed   By: Dereck Ligas M.D.   On: 07/20/2014 16:29    Microbiology: Recent Results (from the past 240 hour(s))  Urine culture     Status: None   Collection Time: 07/20/14  4:17 PM   Result Value Ref Range Status   Specimen Description URINE, CATHETERIZED  Final   Special Requests NONE  Final   Colony Count NO GROWTH Performed at [facility]   Final   Culture NO GROWTH Performed at [facility]   Final   Report Status 07/21/2014 FINAL  Final  Culture, blood (routine x 2)     Status: None   Collection Time: 07/20/14  7:45 PM  Result Value Ref Range Status   Specimen Description BLOOD LEFT ANTECUBITAL  Final   Special Requests BOTTLES DRAWN AEROBIC AND ANAEROBIC 8CC EACH  Final   Culture NO GROWTH 5 DAYS  Final   Report Status 07/25/2014 FINAL  Final  Culture, blood (routine x 2)     Status: None   Collection Time: 07/20/14  9:39 PM  Result Value Ref Range Status   Specimen Description BLOOD RIGHT ANTECUBITAL  Final   Special Requests   Final    BOTTLES DRAWN AEROBIC AND ANAEROBIC AEB=10CC ANA=8CC   Culture NO GROWTH 5 DAYS  Final   Report Status 07/25/2014 FINAL  Final  MRSA PCR Screening     Status: None   Collection Time: 07/20/14 10:40 PM  Result Value Ref Range Status   MRSA by PCR NEGATIVE NEGATIVE Final    Comment:        The GeneXpert MRSA Assay (FDA approved for NASAL specimens only), is one component of a comprehensive MRSA colonization surveillance program. It is not intended to diagnose MRSA infection nor to guide or monitor treatment for MRSA infections.      Labs: Basic Metabolic Panel:  Recent Labs Lab 07/20/14 1537 07/21/14 0539 07/22/14 0607 07/23/14 0633 07/26/14 0605  NA 138 140 141 142 141  K 3.7 3.8 3.6 3.6 3.7  CL 104 103 103 103 106  CO2 26 29 30 31 27   GLUCOSE 149* 140* 144* 149* 162*  BUN 25* 22 19 19 17   CREATININE 1.19 1.23 1.14 1.03 0.84  CALCIUM 9.0 8.9 8.5 8.3* 8.6   Liver Function Tests:  Recent Labs Lab 07/20/14 1537 07/21/14 0539  AST 14 14  ALT 14 13  ALKPHOS 51 41  BILITOT 1.1 1.1  PROT 7.1 6.5  ALBUMIN 3.8 3.4*   No results for input(s): LIPASE, AMYLASE in the last 168  hours. No results for input(s): AMMONIA in the last 168 hours. CBC:  Recent Labs Lab 07/20/14 1537 07/21/14 0539  07/22/14 0607 07/23/14 0633  WBC 20.5* 15.1* 9.8 7.8  NEUTROABS 18.2*  --   --   --   HGB 13.6 12.5* 11.8* 11.9*  HCT 40.9 38.1* 36.9* 36.7*  MCV 96.7 97.9 97.4 97.1  PLT 191 167 160 176   Cardiac Enzymes:  Recent Labs Lab 07/20/14 1537  TROPONINI <0.03   BNP: BNP (last 3 results)  Recent Labs  07/20/14 1537  BNP 147.0*    ProBNP (last 3 results)  Recent Labs  04/05/14 0621  PROBNP 1106.0*    CBG:  Recent Labs Lab 07/25/14 0722 07/25/14 1130 07/25/14 1612 07/25/14 2043 07/26/14 0741  GLUCAP 146* 207* 174* 220* 141*       Signed:  Vern Prestia M  [facility] 07/26/2014, 12:46 PM

## 2014-08-30 ENCOUNTER — Encounter (HOSPITAL_COMMUNITY): Payer: Self-pay | Admitting: Emergency Medicine

## 2014-08-30 ENCOUNTER — Emergency Department (HOSPITAL_COMMUNITY)
Admission: EM | Admit: 2014-08-30 | Discharge: 2014-08-30 | Disposition: A | Discharge status: Home / Self Care | Payer: Medicare Other | Attending: Emergency Medicine | Admitting: Emergency Medicine

## 2014-08-30 DIAGNOSIS — Z7982 Long term (current) use of aspirin: Secondary | ICD-10-CM | POA: Diagnosis not present

## 2014-08-30 DIAGNOSIS — M545 Low back pain: Secondary | ICD-10-CM | POA: Diagnosis present

## 2014-08-30 DIAGNOSIS — E1140 Type 2 diabetes mellitus with diabetic neuropathy, unspecified: Secondary | ICD-10-CM | POA: Insufficient documentation

## 2014-08-30 DIAGNOSIS — Z720 Tobacco use: Secondary | ICD-10-CM | POA: Diagnosis not present

## 2014-08-30 DIAGNOSIS — E780 Pure hypercholesterolemia: Secondary | ICD-10-CM | POA: Insufficient documentation

## 2014-08-30 DIAGNOSIS — F329 Major depressive disorder, single episode, unspecified: Secondary | ICD-10-CM | POA: Insufficient documentation

## 2014-08-30 DIAGNOSIS — I509 Heart failure, unspecified: Secondary | ICD-10-CM | POA: Diagnosis not present

## 2014-08-30 DIAGNOSIS — G8929 Other chronic pain: Secondary | ICD-10-CM | POA: Insufficient documentation

## 2014-08-30 DIAGNOSIS — J449 Chronic obstructive pulmonary disease, unspecified: Secondary | ICD-10-CM | POA: Insufficient documentation

## 2014-08-30 DIAGNOSIS — Z8673 Personal history of transient ischemic attack (TIA), and cerebral infarction without residual deficits: Secondary | ICD-10-CM | POA: Diagnosis not present

## 2014-08-30 DIAGNOSIS — Z79899 Other long term (current) drug therapy: Secondary | ICD-10-CM | POA: Diagnosis not present

## 2014-08-30 DIAGNOSIS — I10 Essential (primary) hypertension: Secondary | ICD-10-CM | POA: Insufficient documentation

## 2014-08-30 DIAGNOSIS — F419 Anxiety disorder, unspecified: Secondary | ICD-10-CM | POA: Insufficient documentation

## 2014-08-30 MED ORDER — DIAZEPAM 5 MG PO TABS
5.0000 mg | ORAL_TABLET | Freq: Once | ORAL | Status: AC
  Administered 2014-08-30: 5 mg via ORAL
  Filled 2014-08-30: qty 1

## 2014-08-30 MED ORDER — HYDROMORPHONE HCL 1 MG/ML IJ SOLN
1.0000 mg | Freq: Once | INTRAMUSCULAR | Status: AC
  Administered 2014-08-30: 1 mg via INTRAVENOUS
  Filled 2014-08-30: qty 1

## 2014-08-30 MED ORDER — IBUPROFEN 400 MG PO TABS
600.0000 mg | ORAL_TABLET | Freq: Once | ORAL | Status: AC
  Administered 2014-08-30: 600 mg via ORAL
  Filled 2014-08-30: qty 2

## 2014-08-30 MED ORDER — OXYCODONE-ACETAMINOPHEN 5-325 MG PO TABS: 1.0000 | ORAL_TABLET | ORAL | Status: DC | PRN

## 2014-08-30 NOTE — ED Provider Notes (Signed)
CSN: [id number]     Arrival date & time 08/30/14  1040 History  This chart was scribed for Virgel Manifold, MD by Edison Simon, ED Scribe. This patient was seen in room APA18/AP[facility]18 and the patient's care was started at 12:51 PM.    Chief Complaint  Patient presents with  . Back Pain   The history is provided by the patient. No language interpreter was used.    HPI Comments: Eric Scott is a 64 y.o. male who presents to the Emergency Department complaining of low back pain with sudden onset upon waking 4 days ago. He states it has been constant since then. He locates it to middle of back, not radiating anywhere. He states it is worse with movement. He states he has had similar back problems before but denies prior surgeries. He states he used hydrocodone this morning without significant improvement. He has history of diabetic peripheral neuropathy and states he has chronic pain in his legs. He denies urinary symptoms, fever, chills, abdominal pain, or new numbness/tingling.  Past Medical History  Diagnosis Date  . Hypertension   . Hypercholesteremia   . Type II diabetes mellitus   . Diabetic peripheral neuropathy     "chronic" (05/27/2012)  . Stroke 05/2012    Subacute, lacunar infarcts within the left basal ganglia and posterior limp of the left internal capsule/thalamus; "RUE; both feet weak" (05/27/2012)  . Anxiety   . Spinal stenosis     mild lumbar (MRI 05/2012)-L2-L3 to L4-L5 , mild lumbar foraminal stenosis   . Chronic pain     legs, back; MRI 05/2012 with mild thoracic degenerative changes no spinal stenosis   . Depression   . PVD (peripheral vascular disease)   . Peripheral edema   . CHF (congestive heart failure)   . COPD (chronic obstructive pulmonary disease)   . Tremor    Past Surgical History  Procedure Laterality Date  . Hernia repair  01/05/2004    "belly button" (05/27/2012)  . Hernia repair  01/05/2004  . Ankle surgery     History reviewed. No pertinent family  history. History  Substance Use Topics  . Smoking status: Current Every Day Smoker -- 0.50 packs/day for 45 years    Types: Cigarettes  . Smokeless tobacco: Never Used  . Alcohol Use: No     Comment: 05/27/2012 "drank gallons and gallons 20 yr ago or so; last drink  at least 10 yr ago"    Review of Systems  Constitutional: Negative for fever and chills.  Gastrointestinal: Negative for abdominal pain.  Genitourinary: Negative for dysuria, frequency, hematuria and difficulty urinating.  Musculoskeletal: Positive for back pain.  Neurological: Negative for numbness.  All other systems reviewed and are negative.     Allergies  Cucumber extract; Flexeril; and Kiwi extract  Home Medications   Prior to Admission medications   Medication Sig Start Date End Date Taking? Authorizing Provider  albuterol (PROVENTIL) (2.5 MG/3ML) 0.083% nebulizer solution Take 2.5 mg by nebulization every 3 (three) hours as needed for wheezing or shortness of breath.   Yes Historical Provider, MD  amLODipine (NORVASC) 10 MG tablet Take 10 mg by mouth daily.   Yes Historical Provider, MD  aspirin EC 81 MG tablet Take 81 mg by mouth daily.   Yes Historical Provider, MD  atorvastatin (LIPITOR) 20 MG tablet Take 20 mg by mouth daily.    Yes Historical Provider, MD  calcium-vitamin D (OSCAL WITH D) 500-200 MG-UNIT per tablet Take 1 tablet by mouth  3 (three) times daily.   Yes Historical Provider, MD  citalopram (CELEXA) 20 MG tablet Take 20 mg by mouth daily.   Yes Historical Provider, MD  fish oil-omega-3 fatty acids 1000 MG capsule Take 1 g by mouth 2 (two) times daily.   Yes Historical Provider, MD  Fluticasone-Salmeterol (ADVAIR) 250-50 MCG/DOSE AEPB Inhale 1 puff into the lungs every 12 (twelve) hours.   Yes Historical Provider, MD  furosemide (LASIX) 20 MG tablet Take 20 mg by mouth daily.   Yes Historical Provider, MD  gabapentin (NEURONTIN) 800 MG tablet Take 800 mg by mouth 3 (three) times daily.   Yes  Historical Provider, MD  glimepiride (AMARYL) 2 MG tablet Take 2 mg by mouth daily with breakfast.   Yes Historical Provider, MD  HYDROcodone-acetaminophen (NORCO) 10-325 MG per tablet Take 1 tablet by mouth every 8 (eight) hours as needed for severe pain. 07/26/14  Yes Lezlie Octave Black, NP  ipratropium (ATROVENT) 0.02 % nebulizer solution Take 0.5 mg by nebulization every 6 (six) hours as needed for wheezing or shortness of breath.   Yes Historical Provider, MD  LORazepam (ATIVAN) 1 MG tablet Take 1 mg by mouth every 8 (eight) hours as needed for anxiety.   Yes Historical Provider, MD  metFORMIN (GLUCOPHAGE) 500 MG tablet Take 1,000 mg by mouth 2 (two) times daily with a meal.   Yes Historical Provider, MD  polyethylene glycol (MIRALAX) packet Take 17 g by mouth daily. Patient taking differently: Take 17 g by mouth daily as needed for mild constipation or moderate constipation.  04/09/14  Yes Kathie Dike, MD  tiotropium (SPIRIVA) 18 MCG inhalation capsule Place 18 mcg into inhaler and inhale daily.   Yes Historical Provider, MD  triamcinolone cream (KENALOG) 0.1 % Apply 1 application topically daily. Apply to affected area of bilateral lower legs once daily during dressing change.   Yes Historical Provider, MD  AMBULATORY NON FORMULARY MEDICATION Oxycontin 10mg  tablet SA Sig: Take one tablet by mouth every 12 hours for pain. Hold for sedation. Do not crush Patient not taking: Reported on 07/20/2014 05/07/14   Tiffany L Reed, DO  amLODipine (NORVASC) 5 MG tablet Take 2 tablets (10 mg total) by mouth daily. Patient not taking: Reported on 08/30/2014 04/09/14   Kathie Dike, MD  diazepam (VALIUM) 10 MG tablet Take 1 tablet (10 mg total) by mouth every 8 (eight) hours. Patient not taking: Reported on 08/30/2014 07/26/14   Radene Gunning, NP  ipratropium-albuterol (DUONEB) 0.5-2.5 (3) MG/3ML SOLN Take 3 mLs by nebulization every 6 (six) hours. Patient not taking: Reported on 08/30/2014 04/09/14   Kathie Dike, MD   BP 132/81 mmHg  Pulse 75  Temp(Src) 98.6 F (37 C) (Oral)  Resp 16  Ht 6' (1.829 m)  Wt 280 lb (127.007 kg)  BMI 37.97 kg/m2  SpO2 100% Physical Exam  Constitutional: He appears well-developed and well-nourished. No distress.  HENT:  Head: Normocephalic and atraumatic.  Eyes: Conjunctivae are normal. Right eye exhibits no discharge. Left eye exhibits no discharge.  Neck: Neck supple.  Cardiovascular: Normal rate, regular rhythm and normal heart sounds.  Exam reveals no gallop and no friction rub.   No murmur heard. Pulmonary/Chest: Effort normal and breath sounds normal. No respiratory distress.  Abdominal: Soft. He exhibits no distension. There is no tenderness.  Musculoskeletal: He exhibits no edema or tenderness.  Back pain not reproduceable with palpation strength 5/5 in lower extremities Decreased sensation to light touch bilaterally, patient reports chronic  Neurological: He is alert.  Skin: Skin is warm and dry.  Psychiatric: He has a normal mood and affect. His behavior is normal. Thought content normal.  Nursing note and vitals reviewed.   ED Course  Procedures (including critical care time)  DIAGNOSTIC STUDIES: Oxygen Saturation is 100% on room air, normal by my interpretation.    COORDINATION OF CARE: 12:56 PM Discussed treatment plan with patient at beside, the patient agrees with the plan and has no further questions at this time.   Labs Review Labs Reviewed - No data to display  Imaging Review No results found.   EKG Interpretation None      MDM   Final diagnoses:  Low back pain without sciatica, unspecified back pain laterality    64yM with lower back pain. Very low suspicion for acute neurologic emergency or infectious process.   I personally preformed the services scribed in my presence. The recorded information has been reviewed is accurate. Virgel Manifold, MD.   Virgel Manifold, MD 09/02/14 [phone]

## 2014-08-30 NOTE — Discharge Instructions (Signed)
Back Pain, Adult Low back pain is very common. About 1 in 5 people have back pain.The cause of low back pain is rarely dangerous. The pain often gets better over time.About half of people with a sudden onset of back pain feel better in just 2 weeks. About 8 in 10 people feel better by 6 weeks.  CAUSES Some common causes of back pain include:  Strain of the muscles or ligaments supporting the spine.  Wear and tear (degeneration) of the spinal discs.  Arthritis.  Direct injury to the back. DIAGNOSIS Most of the time, the direct cause of low back pain is not known.However, back pain can be treated effectively even when the exact cause of the pain is unknown.Answering your caregiver's questions about your overall health and symptoms is one of the most accurate ways to make sure the cause of your pain is not dangerous. If your caregiver needs more information, he or she may order lab work or imaging tests (X-rays or MRIs).However, even if imaging tests show changes in your back, this usually does not require surgery. HOME CARE INSTRUCTIONS For many people, back pain returns.Since low back pain is rarely dangerous, it is often a condition that people can learn to manageon their own.   Remain active. It is stressful on the back to sit or stand in one place. Do not sit, drive, or stand in one place for more than 30 minutes at a time. Take short walks on level surfaces as soon as pain allows.Try to increase the length of time you walk each day.  Do not stay in bed.Resting more than 1 or 2 days can delay your recovery.  Do not avoid exercise or work.Your body is made to move.It is not dangerous to be active, even though your back may hurt.Your back will likely heal faster if you return to being active before your pain is gone.  Pay attention to your body when you bend and lift. Many people have less discomfortwhen lifting if they bend their knees, keep the load close to their bodies,and  avoid twisting. Often, the most comfortable positions are those that put less stress on your recovering back.  Find a comfortable position to sleep. Use a firm mattress and lie on your side with your knees slightly bent. If you lie on your back, put a pillow under your knees.  Only take over-the-counter or prescription medicines as directed by your caregiver. Over-the-counter medicines to reduce pain and inflammation are often the most helpful.Your caregiver may prescribe muscle relaxant drugs.These medicines help dull your pain so you can more quickly return to your normal activities and healthy exercise.  Put ice on the injured area.  Put ice in a plastic bag.  Place a towel between your skin and the bag.  Leave the ice on for 15-20 minutes, 03-04 times a day for the first 2 to 3 days. After that, ice and heat may be alternated to reduce pain and spasms.  Ask your caregiver about trying back exercises and gentle massage. This may be of some benefit.  Avoid feeling anxious or stressed.Stress increases muscle tension and can worsen back pain.It is important to recognize when you are anxious or stressed and learn ways to manage it.Exercise is a great option. SEEK MEDICAL CARE IF:  You have pain that is not relieved with rest or medicine.  You have pain that does not improve in 1 week.  You have new symptoms.  You are generally not feeling well. SEEK   IMMEDIATE MEDICAL CARE IF:   You have pain that radiates from your back into your legs.  You develop new bowel or bladder control problems.  You have unusual weakness or numbness in your arms or legs.  You develop nausea or vomiting.  You develop abdominal pain.  You feel faint. Document Released: 05/21/2005 Document Revised: 11/20/2011 Document Reviewed: 09/22/2013 [employer] Patient Information 2015 [employer]. This information is not intended to replace advice given to you by your health care provider. Make sure you  discuss any questions you have with your health care provider.  

## 2014-08-30 NOTE — ED Notes (Signed)
Having lower back pain for last 4 days.  Took hydrocodone this am .  Rates 9/10.

## 2015-01-28 ENCOUNTER — Emergency Department (HOSPITAL_COMMUNITY)
Admission: EM | Admit: 2015-01-28 | Discharge: 2015-01-28 | Disposition: A | Discharge status: Home / Self Care | Payer: Medicare Other | Attending: Emergency Medicine | Admitting: Emergency Medicine

## 2015-01-28 ENCOUNTER — Encounter (HOSPITAL_COMMUNITY): Payer: Self-pay

## 2015-01-28 ENCOUNTER — Emergency Department (HOSPITAL_COMMUNITY): Payer: Medicare Other

## 2015-01-28 DIAGNOSIS — E780 Pure hypercholesterolemia: Secondary | ICD-10-CM | POA: Insufficient documentation

## 2015-01-28 DIAGNOSIS — Z7982 Long term (current) use of aspirin: Secondary | ICD-10-CM | POA: Diagnosis not present

## 2015-01-28 DIAGNOSIS — I509 Heart failure, unspecified: Secondary | ICD-10-CM | POA: Insufficient documentation

## 2015-01-28 DIAGNOSIS — G8929 Other chronic pain: Secondary | ICD-10-CM | POA: Diagnosis not present

## 2015-01-28 DIAGNOSIS — Z8673 Personal history of transient ischemic attack (TIA), and cerebral infarction without residual deficits: Secondary | ICD-10-CM | POA: Diagnosis not present

## 2015-01-28 DIAGNOSIS — J449 Chronic obstructive pulmonary disease, unspecified: Secondary | ICD-10-CM | POA: Insufficient documentation

## 2015-01-28 DIAGNOSIS — F329 Major depressive disorder, single episode, unspecified: Secondary | ICD-10-CM | POA: Diagnosis not present

## 2015-01-28 DIAGNOSIS — S91101A Unspecified open wound of right great toe without damage to nail, initial encounter: Secondary | ICD-10-CM | POA: Insufficient documentation

## 2015-01-28 DIAGNOSIS — Y9289 Other specified places as the place of occurrence of the external cause: Secondary | ICD-10-CM | POA: Diagnosis not present

## 2015-01-28 DIAGNOSIS — Y998 Other external cause status: Secondary | ICD-10-CM | POA: Diagnosis not present

## 2015-01-28 DIAGNOSIS — F419 Anxiety disorder, unspecified: Secondary | ICD-10-CM | POA: Diagnosis not present

## 2015-01-28 DIAGNOSIS — Z87891 Personal history of nicotine dependence: Secondary | ICD-10-CM | POA: Diagnosis not present

## 2015-01-28 DIAGNOSIS — T1490XA Injury, unspecified, initial encounter: Secondary | ICD-10-CM

## 2015-01-28 DIAGNOSIS — Y9389 Activity, other specified: Secondary | ICD-10-CM | POA: Insufficient documentation

## 2015-01-28 DIAGNOSIS — W01198A Fall on same level from slipping, tripping and stumbling with subsequent striking against other object, initial encounter: Secondary | ICD-10-CM | POA: Diagnosis not present

## 2015-01-28 DIAGNOSIS — Z8739 Personal history of other diseases of the musculoskeletal system and connective tissue: Secondary | ICD-10-CM | POA: Insufficient documentation

## 2015-01-28 DIAGNOSIS — E1142 Type 2 diabetes mellitus with diabetic polyneuropathy: Secondary | ICD-10-CM | POA: Insufficient documentation

## 2015-01-28 DIAGNOSIS — Z79899 Other long term (current) drug therapy: Secondary | ICD-10-CM | POA: Diagnosis not present

## 2015-01-28 DIAGNOSIS — S91109A Unspecified open wound of unspecified toe(s) without damage to nail, initial encounter: Secondary | ICD-10-CM

## 2015-01-28 DIAGNOSIS — S99921A Unspecified injury of right foot, initial encounter: Secondary | ICD-10-CM | POA: Diagnosis present

## 2015-01-28 DIAGNOSIS — I10 Essential (primary) hypertension: Secondary | ICD-10-CM | POA: Insufficient documentation

## 2015-01-28 MED ORDER — SULFAMETHOXAZOLE-TRIMETHOPRIM 800-160 MG PO TABS: 1.0000 | ORAL_TABLET | Freq: Two times a day (BID) | ORAL | Status: AC

## 2015-01-28 NOTE — ED Notes (Signed)
[facility] called for transport.

## 2015-01-28 NOTE — ED Provider Notes (Signed)
CSN: [id number]     Arrival date & time 01/28/15  1145 History   First MD Initiated Contact with Patient 01/28/15 1151     Chief Complaint  Patient presents with  . Toe Injury     (Consider location/radiation/quality/duration/timing/severity/associated sxs/prior Treatment) HPI Comments: Pt state that he fell and hit his toe last night on a cast iron tub. Denies dizziness or loc. Pt states that he fall all the time. Pt was sent in to have his right great toe evaluated. Pt states that he doesn't think that it is broken. He wants the wound to be looked at.  The history is provided by the patient. No language interpreter was used.    Past Medical History  Diagnosis Date  . Hypertension   . Hypercholesteremia   . Type II diabetes mellitus   . Diabetic peripheral neuropathy     "chronic" (05/27/2012)  . Stroke 05/2012    Subacute, lacunar infarcts within the left basal ganglia and posterior limp of the left internal capsule/thalamus; "RUE; both feet weak" (05/27/2012)  . Anxiety   . Spinal stenosis     mild lumbar (MRI 05/2012)-L2-L3 to L4-L5 , mild lumbar foraminal stenosis   . Chronic pain     legs, back; MRI 05/2012 with mild thoracic degenerative changes no spinal stenosis   . Depression   . PVD (peripheral vascular disease)   . Peripheral edema   . CHF (congestive heart failure)   . COPD (chronic obstructive pulmonary disease)   . Tremor    Past Surgical History  Procedure Laterality Date  . Hernia repair  01/05/2004    "belly button" (05/27/2012)  . Hernia repair  01/05/2004  . Ankle surgery     No family history on file. Social History  Substance Use Topics  . Smoking status: Former Smoker -- 0.50 packs/day for 45 years    Types: Cigarettes    Quit date: 12/28/2014  . Smokeless tobacco: Never Used  . Alcohol Use: No     Comment: 05/27/2012 "drank gallons and gallons 20 yr ago or so; last drink  at least 10 yr ago"    Review of Systems  All other systems reviewed  and are negative.     Allergies  Cucumber extract; Flexeril; and Kiwi extract  Home Medications   Prior to Admission medications   Medication Sig Start Date End Date Taking? Authorizing Provider  albuterol (PROVENTIL) (2.5 MG/3ML) 0.083% nebulizer solution Take 2.5 mg by nebulization every 3 (three) hours as needed for wheezing or shortness of breath.   Yes Historical Provider, MD  ALPRAZolam Duanne Moron) 1 MG tablet Take 1 mg by mouth 3 (three) times daily.   Yes Historical Provider, MD  amLODipine (NORVASC) 10 MG tablet Take 10 mg by mouth daily.   Yes Historical Provider, MD  aspirin EC 81 MG tablet Take 81 mg by mouth daily.   Yes Historical Provider, MD  atorvastatin (LIPITOR) 20 MG tablet Take 20 mg by mouth daily.    Yes Historical Provider, MD  calcium-vitamin D (OSCAL WITH D) 500-200 MG-UNIT per tablet Take 1 tablet by mouth 3 (three) times daily.   Yes Historical Provider, MD  citalopram (CELEXA) 20 MG tablet Take 20 mg by mouth daily.   Yes Historical Provider, MD  fish oil-omega-3 fatty acids 1000 MG capsule Take 1 g by mouth 2 (two) times daily.   Yes Historical Provider, MD  Fluticasone-Salmeterol (ADVAIR) 250-50 MCG/DOSE AEPB Inhale 1 puff into the lungs every 12 (twelve) hours.  Yes Historical Provider, MD  furosemide (LASIX) 20 MG tablet Take 20 mg by mouth daily.   Yes Historical Provider, MD  gabapentin (NEURONTIN) 800 MG tablet Take 800 mg by mouth 3 (three) times daily.   Yes Historical Provider, MD  glimepiride (AMARYL) 2 MG tablet Take 2 mg by mouth daily with breakfast.   Yes Historical Provider, MD  ipratropium (ATROVENT) 0.02 % nebulizer solution Take 0.5 mg by nebulization every 6 (six) hours as needed for wheezing or shortness of breath.   Yes Historical Provider, MD  metFORMIN (GLUCOPHAGE) 500 MG tablet Take 1,000 mg by mouth 2 (two) times daily with a meal.   Yes Historical Provider, MD  oxyCODONE (OXY IR/ROXICODONE) 5 MG immediate release tablet Take 1 tablet by  mouth 3 (three) times daily. 01/24/15  Yes Historical Provider, MD  polyethylene glycol (MIRALAX) packet Take 17 g by mouth daily. Patient taking differently: Take 17 g by mouth daily as needed for mild constipation or moderate constipation.  04/09/14  Yes Kathie Dike, MD  tiotropium (SPIRIVA) 18 MCG inhalation capsule Place 18 mcg into inhaler and inhale daily.   Yes Historical Provider, MD  triamcinolone cream (KENALOG) 0.1 % Apply 1 application topically daily. Apply to affected area of bilateral lower legs once daily during dressing change.   Yes Historical Provider, MD  AMBULATORY NON FORMULARY MEDICATION Oxycontin 10mg  tablet SA Sig: Take one tablet by mouth every 12 hours for pain. Hold for sedation. Do not crush Patient not taking: Reported on 07/20/2014 05/07/14   Tiffany L Reed, DO  HYDROcodone-acetaminophen (NORCO) 10-325 MG per tablet Take 1 tablet by mouth every 8 (eight) hours as needed for severe pain. Patient not taking: Reported on 01/28/2015 07/26/14   Radene Gunning, NP  ipratropium-albuterol (DUONEB) 0.5-2.5 (3) MG/3ML SOLN Take 3 mLs by nebulization every 6 (six) hours. Patient not taking: Reported on 08/30/2014 04/09/14   Kathie Dike, MD  oxyCODONE-acetaminophen (PERCOCET/ROXICET) 5-325 MG per tablet Take 1-2 tablets by mouth every 4 (four) hours as needed. Patient not taking: Reported on 01/28/2015 08/30/14   Virgel Manifold, MD   BP 140/72 mmHg  Pulse 70  Temp(Src) 98 F (36.7 C) (Oral)  Resp 22  Ht 6' (1.829 m)  Wt 295 lb (133.811 kg)  BMI 40.00 kg/m2  SpO2 96% Physical Exam  Constitutional: He is oriented to person, place, and time. He appears well-developed and well-nourished.  Cardiovascular: Normal rate and regular rhythm.   Pulmonary/Chest: Effort normal and breath sounds normal.  Musculoskeletal: Normal range of motion. He exhibits edema.  Neurological: He is alert and oriented to person, place, and time.  Skin:  Medial right great toe has a skin avulsion. No  redness noted around the area. No purulent drainage noted  Nursing note and vitals reviewed.   ED Course  Procedures (including critical care time) Labs Review Labs Reviewed - No data to display  Imaging Review Dg Toe Great Right  01/28/2015   CLINICAL DATA:  Right great toe pain. Fall. Diabetic with cellulitis.  EXAM: RIGHT GREAT TOE - 2+ VIEW  COMPARISON:  02/05/2013  FINDINGS: Three-view centered about the right great toe. Chronic deformity involving the first metatarsal phalangeal joint, including a osseous irregularity on both sides of the joint with sclerosis. No superimposed acute injury identified. No new osseous destruction. Diffuse soft tissue swelling about the midfoot. No radiopaque foreign object.  IMPRESSION: Similar appearance of chronic deformity involving the first metatarsal phalangeal joint. This could relate to remote infection or even  gout. No acute superimposed process.   Electronically Signed   By: Abigail Miyamoto M.D.   On: 01/28/2015 12:59   I have personally reviewed and evaluated these images and lab results as part of my medical decision-making.   EKG Interpretation None      MDM   Final diagnoses:  Open toe wound, initial encounter    No bony abnormality noted. Will start on bactrim. Discussed with pt that he needs to have someone looking at the wound daily    Glendell Docker, NP 01/28/15 1330  Merrily Pew, MD 01/28/15 [phone]

## 2015-01-28 NOTE — Discharge Instructions (Signed)
Deep Skin Avulsion A deep skin avulsion is when all layers of the skin or parts of body structures have been torn away. This is usually a result of severe injury (trauma). A deep skin avulsion can include damage to important structures beneath the skin such as tendons, ligaments, nerves, or blood vessels.  CAUSES  Many injuries can lead to a deep skin avulsion. These include:   Crush injuries.  Bites.  Falls against jagged surfaces.  Gunshot wounds.  Severe burns and injuries involving dragging (such as those from a bicycle or motorcycle accident). TREATMENT   If the wound is small and there is no damage to vital structures like nerves and blood vessels, the damaged tissues may be removed. Then, the wound can be cleaned thoroughly and closed.  A skin graft may be performed. This is a procedure in which the outer layer of skin is removed from a different part of your body. That skin (skin graft) is used to cover the open wound. This can happen after damaged tissue is removed and repairs are completed.  Your caregiver may onlyapply a bandage (dressing) to the wound. The wound will be kept clean and allowed to heal. Healing can take weeks or months and usually leaves a large scar. This type of treatment is only done if your caregiver feels that skin grafting or a similar procedure would not work. You might need a tetanus shot if:  You cannot remember when you had your last tetanus shot.  You have never had a tetanus shot.  The injury broke your skin. If you got a tetanus shot, your arm may swell, get red, and feel warm to the touch. This is common and not a problem. If you need a tetanus shot and you choose not to have one, there is a rare chance of getting tetanus. Sickness from tetanus can be serious. HOME CARE INSTRUCTIONS   Only take over-the-counter or prescription medicines for pain, discomfort, or fever as directed by your caregiver.  Gently wash the area with mild soap and  water 2 times a day, or as directed. Rinse off the soap. Pat the area dry with a clean towel. Do not rub the wound. This may cause bleeding.  Follow your caregiver's instructions for how often you need to change the dressing.  Apply ointment and a dressing to the wound as directed.  If the dressing sticks, moisten it with soapy water and gently remove it.  Change the bandage right away if it becomes wet, dirty, or starts to smell bad.  Take showers. Do not take tub baths, swim, or do anything that may soak the wound until it is healed.  Use anti-itch medicine as directed by your caregiver. The wound may itch when it is healing. Do not pick or scratch at the wound.  Follow up with your caregiver for stitches (sutures), staple, or skin adhesive strip removal. SEEK MEDICAL CARE IF:   You have redness, swelling, or increasing pain in your wound.  A red streak or line extends away from the wound.  You have pus coming from the wound.  You notice a bad smell coming from thewound or dressing.  The wound breaks open (edges not staying together) after sutures have been removed.  You notice something coming out of the wound, such as a small piece of wood, glass, or metal.  You are unable to properly move a finger or toe if the wound is on your hand or foot.  You have severe  swelling around the wound that causes pain and numbness.  Your arm, hand, leg, or foot changes color. SEEK IMMEDIATE MEDICAL CARE IF:   Your pain becomes severe or is not adequately relieved with pain medicine.  You have a fever.  You have nausea and vomiting for more than 24 hours.  You feel lightheaded, weak, or faint.  You develop chest pain or difficulty breathing. MAKE SURE YOU:   Understand these instructions.  Will watch your condition.  Will get help right away if you are not doing well or get worse. Document Released: 07/17/2006 Document Revised: 08/13/2011 Document Reviewed:  09/24/2010 [facility] Patient Information 2015 [place], [place]. This information is not intended to replace advice given to you by your health care provider. Make sure you discuss any questions you have with your health care provider.

## 2015-01-28 NOTE — ED Notes (Signed)
Pt here from [facility] for evaluation of toe injury. Pt states he hit his right great toe on a cast iron tub. Pt has chronic swelling to lower extremities

## 2015-03-02 ENCOUNTER — Encounter (HOSPITAL_COMMUNITY): Payer: Self-pay | Admitting: *Deleted

## 2015-03-02 ENCOUNTER — Emergency Department (HOSPITAL_COMMUNITY)
Admission: EM | Admit: 2015-03-02 | Discharge: 2015-03-02 | Disposition: A | Discharge status: Home / Self Care | Payer: Medicare Other | Attending: Emergency Medicine | Admitting: Emergency Medicine

## 2015-03-02 DIAGNOSIS — Z87891 Personal history of nicotine dependence: Secondary | ICD-10-CM | POA: Diagnosis not present

## 2015-03-02 DIAGNOSIS — G8929 Other chronic pain: Secondary | ICD-10-CM | POA: Insufficient documentation

## 2015-03-02 DIAGNOSIS — E780 Pure hypercholesterolemia: Secondary | ICD-10-CM | POA: Insufficient documentation

## 2015-03-02 DIAGNOSIS — Z7982 Long term (current) use of aspirin: Secondary | ICD-10-CM | POA: Insufficient documentation

## 2015-03-02 DIAGNOSIS — Z79899 Other long term (current) drug therapy: Secondary | ICD-10-CM | POA: Diagnosis not present

## 2015-03-02 DIAGNOSIS — E1140 Type 2 diabetes mellitus with diabetic neuropathy, unspecified: Secondary | ICD-10-CM | POA: Insufficient documentation

## 2015-03-02 DIAGNOSIS — F419 Anxiety disorder, unspecified: Secondary | ICD-10-CM | POA: Insufficient documentation

## 2015-03-02 DIAGNOSIS — I509 Heart failure, unspecified: Secondary | ICD-10-CM | POA: Diagnosis not present

## 2015-03-02 DIAGNOSIS — R600 Localized edema: Secondary | ICD-10-CM | POA: Insufficient documentation

## 2015-03-02 DIAGNOSIS — Z8673 Personal history of transient ischemic attack (TIA), and cerebral infarction without residual deficits: Secondary | ICD-10-CM | POA: Insufficient documentation

## 2015-03-02 DIAGNOSIS — Z7952 Long term (current) use of systemic steroids: Secondary | ICD-10-CM | POA: Insufficient documentation

## 2015-03-02 DIAGNOSIS — L97919 Non-pressure chronic ulcer of unspecified part of right lower leg with unspecified severity: Secondary | ICD-10-CM | POA: Insufficient documentation

## 2015-03-02 DIAGNOSIS — F329 Major depressive disorder, single episode, unspecified: Secondary | ICD-10-CM | POA: Diagnosis not present

## 2015-03-02 DIAGNOSIS — L97929 Non-pressure chronic ulcer of unspecified part of left lower leg with unspecified severity: Secondary | ICD-10-CM | POA: Insufficient documentation

## 2015-03-02 DIAGNOSIS — L98499 Non-pressure chronic ulcer of skin of other sites with unspecified severity: Secondary | ICD-10-CM

## 2015-03-02 DIAGNOSIS — N179 Acute kidney failure, unspecified: Secondary | ICD-10-CM | POA: Insufficient documentation

## 2015-03-02 DIAGNOSIS — J441 Chronic obstructive pulmonary disease with (acute) exacerbation: Secondary | ICD-10-CM | POA: Diagnosis not present

## 2015-03-02 DIAGNOSIS — I10 Essential (primary) hypertension: Secondary | ICD-10-CM | POA: Insufficient documentation

## 2015-03-02 DIAGNOSIS — M7989 Other specified soft tissue disorders: Secondary | ICD-10-CM | POA: Diagnosis present

## 2015-03-02 LAB — CBC WITH DIFFERENTIAL/PLATELET
BASOS ABS: 0 10*3/uL (ref 0.0–0.1)
Basophils Relative: 0 %
Eosinophils Absolute: 0.3 10*3/uL (ref 0.0–0.7)
Eosinophils Relative: 4 %
HEMATOCRIT: 38.1 % — AB (ref 39.0–52.0)
HEMOGLOBIN: 12.8 g/dL — AB (ref 13.0–17.0)
Lymphocytes Relative: 16 %
Lymphs Abs: 1.3 10*3/uL (ref 0.7–4.0)
MCH: 32.3 pg (ref 26.0–34.0)
MCHC: 33.6 g/dL (ref 30.0–36.0)
MCV: 96.2 fL (ref 78.0–100.0)
Monocytes Absolute: 0.6 10*3/uL (ref 0.1–1.0)
Monocytes Relative: 7 %
NEUTROS ABS: 5.8 10*3/uL (ref 1.7–7.7)
Neutrophils Relative %: 73 %
Platelets: 193 10*3/uL (ref 150–400)
RBC: 3.96 MIL/uL — ABNORMAL LOW (ref 4.22–5.81)
RDW: 14.6 % (ref 11.5–15.5)
WBC: 8 10*3/uL (ref 4.0–10.5)

## 2015-03-02 LAB — BASIC METABOLIC PANEL
ANION GAP: 8 (ref 5–15)
BUN: 25 mg/dL — ABNORMAL HIGH (ref 6–20)
CHLORIDE: 101 mmol/L (ref 101–111)
CO2: 30 mmol/L (ref 22–32)
Calcium: 8.7 mg/dL — ABNORMAL LOW (ref 8.9–10.3)
Creatinine, Ser: 1.54 mg/dL — ABNORMAL HIGH (ref 0.61–1.24)
GFR calc Af Amer: 53 mL/min — ABNORMAL LOW (ref >60)
GFR, EST NON AFRICAN AMERICAN: 46 mL/min — AB (ref >60)
GLUCOSE: 172 mg/dL — AB (ref 65–99)
POTASSIUM: 4.2 mmol/L (ref 3.5–5.1)
Sodium: 139 mmol/L (ref 135–145)

## 2015-03-02 LAB — BRAIN NATRIURETIC PEPTIDE: B Natriuretic Peptide: 93 pg/mL (ref 0.0–100.0)

## 2015-03-02 NOTE — ED Notes (Signed)
Phoned [facility] regarding discharge home.

## 2015-03-02 NOTE — ED Provider Notes (Signed)
CSN: [id number]     Arrival date & time 03/02/15  [id number] History  By signing my name below, I, Stephania Fragmin, attest that this documentation has been prepared under the direction and in the presence of Elnora Morrison, MD. Electronically Signed: Stephania Fragmin, ED Scribe. 03/02/2015. 10:34 AM.     Chief Complaint  Patient presents with  . Leg Swelling   The history is provided by the patient and a caregiver. No language interpreter was used.   HPI Comments: Eric Scott is a 64 y.o. male from [place], with a history of HTN, DM, COPD, cellulitis, and CHF, who presents to the Emergency Department complaining of constant, worsening leg pain, swelling, and redness that began 3 days ago. His caregiver reports baseline leg swelling but states this worsened over the past 3 days to the point that she noticed blistering and drainage today. She states patient has been refusing to wear his TED hose for his chronic leg swelling. Patient denies being on antibiotics. He denies fever, chills, abdominal pain, vomiting, diarrhea, headache, or vision changes. He denies a history of blood clots or any recent surgeries.   Past Medical History  Diagnosis Date  . Hypertension   . Hypercholesteremia   . Type II diabetes mellitus   . Diabetic peripheral neuropathy     "chronic" (05/27/2012)  . Stroke 05/2012    Subacute, lacunar infarcts within the left basal ganglia and posterior limp of the left internal capsule/thalamus; "RUE; both feet weak" (05/27/2012)  . Anxiety   . Spinal stenosis     mild lumbar (MRI 05/2012)-L2-L3 to L4-L5 , mild lumbar foraminal stenosis   . Chronic pain     legs, back; MRI 05/2012 with mild thoracic degenerative changes no spinal stenosis   . Depression   . PVD (peripheral vascular disease)   . Peripheral edema   . CHF (congestive heart failure)   . COPD (chronic obstructive pulmonary disease)   . Tremor    Past Surgical History  Procedure Laterality Date   . Hernia repair  01/05/2004    "belly button" (05/27/2012)  . Hernia repair  01/05/2004  . Ankle surgery     No family history on file. Social History  Substance Use Topics  . Smoking status: Former Smoker -- 0.50 packs/day for 45 years    Types: Cigarettes    Quit date: 12/28/2014  . Smokeless tobacco: Never Used  . Alcohol Use: No     Comment: 05/27/2012 "drank gallons and gallons 20 yr ago or so; last drink  at least 10 yr ago"    Review of Systems  Constitutional: Negative for fever and chills.  Eyes: Negative for visual disturbance.  Cardiovascular: Positive for leg swelling.  Gastrointestinal: Negative for vomiting, abdominal pain and diarrhea.  Skin: Positive for color change (redness).       Positive for blistering and drainage from leg swelling  Neurological: Negative for headaches.  All other systems reviewed and are negative.     Allergies  Cucumber extract; Flexeril; and Kiwi extract  Home Medications   Prior to Admission medications   Medication Sig Start Date End Date Taking? Authorizing Provider  albuterol (PROVENTIL) (2.5 MG/3ML) 0.083% nebulizer solution Take 2.5 mg by nebulization every 3 (three) hours as needed for wheezing or shortness of breath.    Historical Provider, MD  ALPRAZolam Duanne Moron) 1 MG tablet Take 1 mg by mouth 3 (three) times daily.    Historical Provider, MD  [place]  MEDICATION Oxycontin 10mg  tablet SA Sig: Take one tablet by mouth every 12 hours for pain. Hold for sedation. Do not crush Patient not taking: Reported on 07/20/2014 05/07/14   Tiffany L Reed, DO  amLODipine (NORVASC) 10 MG tablet Take 10 mg by mouth daily.    Historical Provider, MD  aspirin EC 81 MG tablet Take 81 mg by mouth daily.    Historical Provider, MD  atorvastatin (LIPITOR) 20 MG tablet Take 20 mg by mouth daily.     Historical Provider, MD  calcium-vitamin D (OSCAL WITH D) 500-200 MG-UNIT per tablet Take 1 tablet by mouth 3 (three) times daily.     Historical Provider, MD  citalopram (CELEXA) 20 MG tablet Take 20 mg by mouth daily.    Historical Provider, MD  fish oil-omega-3 fatty acids 1000 MG capsule Take 1 g by mouth 2 (two) times daily.    Historical Provider, MD  Fluticasone-Salmeterol (ADVAIR) 250-50 MCG/DOSE AEPB Inhale 1 puff into the lungs every 12 (twelve) hours.    Historical Provider, MD  furosemide (LASIX) 20 MG tablet Take 20 mg by mouth daily.    Historical Provider, MD  gabapentin (NEURONTIN) 800 MG tablet Take 800 mg by mouth 3 (three) times daily.    Historical Provider, MD  glimepiride (AMARYL) 2 MG tablet Take 2 mg by mouth daily with breakfast.    Historical Provider, MD  HYDROcodone-acetaminophen (NORCO) 10-325 MG per tablet Take 1 tablet by mouth every 8 (eight) hours as needed for severe pain. Patient not taking: Reported on 01/28/2015 07/26/14   Radene Gunning, NP  ipratropium (ATROVENT) 0.02 % nebulizer solution Take 0.5 mg by nebulization every 6 (six) hours as needed for wheezing or shortness of breath.    Historical Provider, MD  ipratropium-albuterol (DUONEB) 0.5-2.5 (3) MG/3ML SOLN Take 3 mLs by nebulization every 6 (six) hours. Patient not taking: Reported on 08/30/2014 04/09/14   Kathie Dike, MD  metFORMIN (GLUCOPHAGE) 500 MG tablet Take 1,000 mg by mouth 2 (two) times daily with a meal.    Historical Provider, MD  oxyCODONE (OXY IR/ROXICODONE) 5 MG immediate release tablet Take 1 tablet by mouth 3 (three) times daily. 01/24/15   Historical Provider, MD  oxyCODONE-acetaminophen (PERCOCET/ROXICET) 5-325 MG per tablet Take 1-2 tablets by mouth every 4 (four) hours as needed. Patient not taking: Reported on 01/28/2015 08/30/14   Virgel Manifold, MD  polyethylene glycol [facility]) packet Take 17 g by mouth daily. Patient taking differently: Take 17 g by mouth daily as needed for mild constipation or moderate constipation.  04/09/14   Kathie Dike, MD  tiotropium (SPIRIVA) 18 MCG inhalation capsule Place 18 mcg into  inhaler and inhale daily.    Historical Provider, MD  triamcinolone cream (KENALOG) 0.1 % Apply 1 application topically daily. Apply to affected area of bilateral lower legs once daily during dressing change.    Historical Provider, MD   BP 106/59 mmHg  Pulse 69  Temp(Src) 97.7 F (36.5 C) (Oral)  Resp 18  SpO2 95% Physical Exam  Constitutional: He is oriented to person, place, and time. He appears well-developed and well-nourished. No distress.  HENT:  Head: Normocephalic and atraumatic.  Eyes: Conjunctivae and EOM are normal.  Neck: Neck supple. No tracheal deviation present.  Cardiovascular: Normal rate.   Pulmonary/Chest: Effort normal. No respiratory distress. He has wheezes.  Overall lungs are clear. Sparse expiratory wheezes.  Musculoskeletal: Normal range of motion.  2+ pitting edema BLE, worse on the left. Few anterior blisters approximately 1-2  cm in diameter, with clear drainage. There is no significant warmth. Mild erythema and ulcerations on the anterior BLE. Difficulty obtaining pulses due to to swelling. Good cap refill to distal left leg.   Neurological: He is alert and oriented to person, place, and time.  Skin: Skin is warm and dry.  Psychiatric: He has a normal mood and affect. His behavior is normal.  Nursing note and vitals reviewed.   ED Course  Procedures (including critical care time)  DIAGNOSTIC STUDIES: Oxygen Saturation is 95% on RA, adequate by my interpretation.    COORDINATION OF CARE: 9:39 AM - No obvious infection at this time. Discussed treatment plan with pt at bedside which includes blood tests, continuing good wound care, compression, and f/u with PCP in 1 week. Pt verbalized understanding and agreed to plan.   Labs Review Labs Reviewed  CBC WITH DIFFERENTIAL/PLATELET - Abnormal; Notable for the following:    RBC 3.96 (*)    Hemoglobin 12.8 (*)    HCT 38.1 (*)    All other components within normal limits  BASIC METABOLIC PANEL - Abnormal;  Notable for the following:    Glucose, Bld 172 (*)    BUN 25 (*)    Creatinine, Ser 1.54 (*)    Calcium 8.7 (*)    GFR calc non Af Amer 46 (*)    GFR calc Af Amer 53 (*)    All other components within normal limits  BRAIN NATRIURETIC PEPTIDE    MDM   Final diagnoses:  Skin ulceration, with unspecified severity  Bilateral leg edema  Acute renal failure, unspecified acute renal failure type   Patient presents with acute on chronic leg swelling and superficial ulcerations worse left leg. Very low concern for infection at this time. Discussed importance of TED hose, elevating legs, recommended taking extra Lasix next 3 days. Worsening kidney function will need follow-up with primary doctor.  Referral to home health.  Results and differential diagnosis were discussed with the patient/parent/guardian. Xrays were independently reviewed by myself.  Close follow up outpatient was discussed, comfortable with the plan.   Medications - No data to display  Filed Vitals:   03/02/15 0937  BP: 106/59  Pulse: 69  Temp: 97.7 F (36.5 C)  TempSrc: Oral  Resp: 18  SpO2: 95%    Final diagnoses:  Skin ulceration, with unspecified severity  Bilateral leg edema  Acute renal failure, unspecified acute renal failure type      Elnora Morrison, MD 03/02/15 1143

## 2015-03-02 NOTE — ED Notes (Signed)
Pt discharged. Waiting for [facility] to trasnport.

## 2015-03-02 NOTE — Discharge Instructions (Signed)
Please wear TED hose as recommended. Keep wounds clean. Elevate when sitting. Increase activity as tolerated. Take an extra Lasix 20 mg the next 3 days. Follow-up with her primary Dr. and wound care.  If you were given medicines take as directed.  If you are on coumadin or contraceptives realize their levels and effectiveness is altered by many different medicines.  If you have any reaction (rash, tongues swelling, other) to the medicines stop taking and see a physician.    If your blood pressure was elevated in the ER make sure you follow up for management with a primary doctor or return for chest pain, shortness of breath or stroke symptoms.  Please follow up as directed and return to the ER or see a physician for new or worsening symptoms.  Thank you. Filed Vitals:   03/02/15 0937  BP: 106/59  Pulse: 69  Temp: 97.7 F (36.5 C)  TempSrc: Oral  Resp: 18  SpO2: 95%

## 2015-03-02 NOTE — ED Notes (Signed)
Pt sleeping with snore present, O2 sat of 88%. Woke pt up and readjusted pt in the bed. O2 sat increased to 96%

## 2015-03-02 NOTE — ED Notes (Signed)
Pt comes in from [facility] with bilateral leg swelling. Pts legs are weeping upon triage. No other symptoms. MD at bedside

## 2015-03-04 ENCOUNTER — Observation Stay (HOSPITAL_COMMUNITY)
Admission: EM | Admit: 2015-03-04 | Discharge: 2015-03-07 | Disposition: A | Discharge status: Home / Self Care | Payer: Medicare Other | Attending: Family Medicine | Admitting: Family Medicine

## 2015-03-04 ENCOUNTER — Emergency Department (HOSPITAL_COMMUNITY): Payer: Medicare Other

## 2015-03-04 ENCOUNTER — Encounter (HOSPITAL_COMMUNITY): Payer: Self-pay

## 2015-03-04 DIAGNOSIS — E1142 Type 2 diabetes mellitus with diabetic polyneuropathy: Secondary | ICD-10-CM | POA: Diagnosis not present

## 2015-03-04 DIAGNOSIS — Z8673 Personal history of transient ischemic attack (TIA), and cerebral infarction without residual deficits: Secondary | ICD-10-CM | POA: Diagnosis not present

## 2015-03-04 DIAGNOSIS — F329 Major depressive disorder, single episode, unspecified: Secondary | ICD-10-CM | POA: Insufficient documentation

## 2015-03-04 DIAGNOSIS — E1342 Other specified diabetes mellitus with diabetic polyneuropathy: Secondary | ICD-10-CM

## 2015-03-04 DIAGNOSIS — M25561 Pain in right knee: Secondary | ICD-10-CM

## 2015-03-04 DIAGNOSIS — I5033 Acute on chronic diastolic (congestive) heart failure: Secondary | ICD-10-CM | POA: Diagnosis present

## 2015-03-04 DIAGNOSIS — Z7982 Long term (current) use of aspirin: Secondary | ICD-10-CM | POA: Insufficient documentation

## 2015-03-04 DIAGNOSIS — R609 Edema, unspecified: Secondary | ICD-10-CM | POA: Diagnosis present

## 2015-03-04 DIAGNOSIS — G8929 Other chronic pain: Secondary | ICD-10-CM | POA: Insufficient documentation

## 2015-03-04 DIAGNOSIS — N289 Disorder of kidney and ureter, unspecified: Secondary | ICD-10-CM | POA: Diagnosis not present

## 2015-03-04 DIAGNOSIS — Z23 Encounter for immunization: Secondary | ICD-10-CM | POA: Diagnosis not present

## 2015-03-04 DIAGNOSIS — I509 Heart failure, unspecified: Secondary | ICD-10-CM | POA: Diagnosis not present

## 2015-03-04 DIAGNOSIS — I5030 Unspecified diastolic (congestive) heart failure: Secondary | ICD-10-CM | POA: Diagnosis not present

## 2015-03-04 DIAGNOSIS — R0602 Shortness of breath: Secondary | ICD-10-CM | POA: Diagnosis present

## 2015-03-04 DIAGNOSIS — N179 Acute kidney failure, unspecified: Secondary | ICD-10-CM | POA: Diagnosis not present

## 2015-03-04 DIAGNOSIS — I10 Essential (primary) hypertension: Secondary | ICD-10-CM | POA: Diagnosis not present

## 2015-03-04 DIAGNOSIS — Z79899 Other long term (current) drug therapy: Secondary | ICD-10-CM | POA: Diagnosis not present

## 2015-03-04 DIAGNOSIS — W19XXXA Unspecified fall, initial encounter: Secondary | ICD-10-CM | POA: Diagnosis not present

## 2015-03-04 DIAGNOSIS — J441 Chronic obstructive pulmonary disease with (acute) exacerbation: Secondary | ICD-10-CM | POA: Diagnosis not present

## 2015-03-04 DIAGNOSIS — G629 Polyneuropathy, unspecified: Secondary | ICD-10-CM

## 2015-03-04 DIAGNOSIS — I5032 Chronic diastolic (congestive) heart failure: Secondary | ICD-10-CM | POA: Diagnosis present

## 2015-03-04 DIAGNOSIS — R531 Weakness: Secondary | ICD-10-CM

## 2015-03-04 DIAGNOSIS — F419 Anxiety disorder, unspecified: Secondary | ICD-10-CM | POA: Insufficient documentation

## 2015-03-04 HISTORY — DX: Unspecified diastolic (congestive) heart failure: I50.30

## 2015-03-04 LAB — CBC WITH DIFFERENTIAL/PLATELET
BASOS ABS: 0 10*3/uL (ref 0.0–0.1)
Basophils Relative: 0 %
EOS ABS: 0.3 10*3/uL (ref 0.0–0.7)
Eosinophils Relative: 3 %
HCT: 38.5 % — ABNORMAL LOW (ref 39.0–52.0)
Hemoglobin: 12.9 g/dL — ABNORMAL LOW (ref 13.0–17.0)
Lymphocytes Relative: 12 %
Lymphs Abs: 1.1 10*3/uL (ref 0.7–4.0)
MCH: 32.2 pg (ref 26.0–34.0)
MCHC: 33.5 g/dL (ref 30.0–36.0)
MCV: 96 fL (ref 78.0–100.0)
Monocytes Absolute: 0.9 10*3/uL (ref 0.1–1.0)
Monocytes Relative: 9 %
NEUTROS PCT: 76 %
Neutro Abs: 7.5 10*3/uL (ref 1.7–7.7)
PLATELETS: 199 10*3/uL (ref 150–400)
RBC: 4.01 MIL/uL — AB (ref 4.22–5.81)
RDW: 14.6 % (ref 11.5–15.5)
WBC: 9.8 10*3/uL (ref 4.0–10.5)

## 2015-03-04 LAB — URINE MICROSCOPIC-ADD ON

## 2015-03-04 LAB — COMPREHENSIVE METABOLIC PANEL
ALT: 44 U/L (ref 17–63)
AST: 40 U/L (ref 15–41)
Albumin: 3.9 g/dL (ref 3.5–5.0)
Alkaline Phosphatase: 99 U/L (ref 38–126)
Anion gap: 6 (ref 5–15)
BUN: 24 mg/dL — ABNORMAL HIGH (ref 6–20)
CO2: 32 mmol/L (ref 22–32)
CREATININE: 1.5 mg/dL — AB (ref 0.61–1.24)
Calcium: 9.8 mg/dL (ref 8.9–10.3)
Chloride: 104 mmol/L (ref 101–111)
GFR, EST AFRICAN AMERICAN: 55 mL/min — AB (ref >60)
GFR, EST NON AFRICAN AMERICAN: 47 mL/min — AB (ref >60)
Glucose, Bld: 140 mg/dL — ABNORMAL HIGH (ref 65–99)
Potassium: 4.2 mmol/L (ref 3.5–5.1)
Sodium: 142 mmol/L (ref 135–145)
TOTAL PROTEIN: 7.5 g/dL (ref 6.5–8.1)
Total Bilirubin: 0.9 mg/dL (ref 0.3–1.2)

## 2015-03-04 LAB — URINALYSIS, ROUTINE W REFLEX MICROSCOPIC
Bilirubin Urine: NEGATIVE
Glucose, UA: NEGATIVE mg/dL
KETONES UR: NEGATIVE mg/dL
Leukocytes, UA: NEGATIVE
Nitrite: NEGATIVE
Protein, ur: NEGATIVE mg/dL
SPECIFIC GRAVITY, URINE: 1.015 (ref 1.005–1.030)
UROBILINOGEN UA: 0.2 mg/dL (ref 0.0–1.0)
pH: 7 (ref 5.0–8.0)

## 2015-03-04 LAB — GLUCOSE, CAPILLARY: GLUCOSE-CAPILLARY: 131 mg/dL — AB (ref 65–99)

## 2015-03-04 LAB — BRAIN NATRIURETIC PEPTIDE: B NATRIURETIC PEPTIDE 5: 60 pg/mL (ref 0.0–100.0)

## 2015-03-04 LAB — TROPONIN I

## 2015-03-04 LAB — MRSA PCR SCREENING: MRSA BY PCR: POSITIVE — AB

## 2015-03-04 MED ORDER — IPRATROPIUM-ALBUTEROL 0.5-2.5 (3) MG/3ML IN SOLN
3.0000 mL | Freq: Once | RESPIRATORY_TRACT | Status: AC
  Administered 2015-03-04: 3 mL via RESPIRATORY_TRACT
  Filled 2015-03-04: qty 3

## 2015-03-04 MED ORDER — SODIUM CHLORIDE 0.9 % IV SOLN
INTRAVENOUS | Status: DC
  Administered 2015-03-05: 02:00:00 via INTRAVENOUS

## 2015-03-04 MED ORDER — IPRATROPIUM BROMIDE 0.02 % IN SOLN: 0.5000 mg | Freq: Once | RESPIRATORY_TRACT | Status: DC

## 2015-03-04 MED ORDER — CITALOPRAM HYDROBROMIDE 20 MG PO TABS
20.0000 mg | ORAL_TABLET | Freq: Every day | ORAL | Status: DC
  Administered 2015-03-05 – 2015-03-07 (×3): 20 mg via ORAL
  Filled 2015-03-04 (×3): qty 1

## 2015-03-04 MED ORDER — ATORVASTATIN CALCIUM 20 MG PO TABS
20.0000 mg | ORAL_TABLET | Freq: Every day | ORAL | Status: DC
Start: 2015-03-04 — End: 2015-03-07
  Administered 2015-03-05 – 2015-03-07 (×3): 20 mg via ORAL
  Filled 2015-03-04 (×3): qty 1

## 2015-03-04 MED ORDER — MOMETASONE FURO-FORMOTEROL FUM 100-5 MCG/ACT IN AERO
2.0000 | INHALATION_SPRAY | Freq: Two times a day (BID) | RESPIRATORY_TRACT | Status: DC
  Administered 2015-03-04 – 2015-03-07 (×6): 2 via RESPIRATORY_TRACT
  Filled 2015-03-04: qty 8.8

## 2015-03-04 MED ORDER — ALBUTEROL SULFATE (2.5 MG/3ML) 0.083% IN NEBU: 2.5000 mg | INHALATION_SOLUTION | RESPIRATORY_TRACT | Status: DC | PRN

## 2015-03-04 MED ORDER — INSULIN ASPART 100 UNIT/ML ~~LOC~~ SOLN
0.0000 [IU] | Freq: Three times a day (TID) | SUBCUTANEOUS | Status: DC
  Administered 2015-03-05: 1 [IU] via SUBCUTANEOUS
  Administered 2015-03-05 – 2015-03-07 (×7): 2 [IU] via SUBCUTANEOUS

## 2015-03-04 MED ORDER — PNEUMOCOCCAL VAC POLYVALENT 25 MCG/0.5ML IJ INJ
0.5000 mL | INJECTION | INTRAMUSCULAR | Status: AC
  Administered 2015-03-05: 0.5 mL via INTRAMUSCULAR
  Filled 2015-03-04: qty 0.5

## 2015-03-04 MED ORDER — ASPIRIN EC 81 MG PO TBEC
81.0000 mg | DELAYED_RELEASE_TABLET | Freq: Every day | ORAL | Status: DC
  Administered 2015-03-05 – 2015-03-07 (×3): 81 mg via ORAL
  Filled 2015-03-04 (×3): qty 1

## 2015-03-04 MED ORDER — ACETAMINOPHEN 325 MG PO TABS: 650.0000 mg | ORAL_TABLET | Freq: Four times a day (QID) | ORAL | Status: DC | PRN

## 2015-03-04 MED ORDER — ENOXAPARIN SODIUM 40 MG/0.4ML ~~LOC~~ SOLN
40.0000 mg | SUBCUTANEOUS | Status: DC
  Administered 2015-03-04 – 2015-03-06 (×3): 40 mg via SUBCUTANEOUS
  Filled 2015-03-04 (×3): qty 0.4

## 2015-03-04 MED ORDER — ALBUTEROL SULFATE (2.5 MG/3ML) 0.083% IN NEBU
2.5000 mg | INHALATION_SOLUTION | Freq: Once | RESPIRATORY_TRACT | Status: AC
  Administered 2015-03-04: 2.5 mg via RESPIRATORY_TRACT
  Filled 2015-03-04: qty 3

## 2015-03-04 MED ORDER — TIOTROPIUM BROMIDE MONOHYDRATE 18 MCG IN CAPS
18.0000 ug | ORAL_CAPSULE | Freq: Every day | RESPIRATORY_TRACT | Status: DC
  Administered 2015-03-05 – 2015-03-07 (×3): 18 ug via RESPIRATORY_TRACT
  Filled 2015-03-04: qty 5

## 2015-03-04 MED ORDER — ALBUTEROL SULFATE (2.5 MG/3ML) 0.083% IN NEBU: 5.0000 mg | INHALATION_SOLUTION | Freq: Once | RESPIRATORY_TRACT | Status: DC

## 2015-03-04 MED ORDER — INFLUENZA VAC SPLIT QUAD 0.5 ML IM SUSY
0.5000 mL | PREFILLED_SYRINGE | INTRAMUSCULAR | Status: AC
  Administered 2015-03-05: 0.5 mL via INTRAMUSCULAR

## 2015-03-04 MED ORDER — INSULIN ASPART 100 UNIT/ML ~~LOC~~ SOLN
0.0000 [IU] | Freq: Every day | SUBCUTANEOUS | Status: DC
  Administered 2015-03-04: 2 [IU] via SUBCUTANEOUS

## 2015-03-04 MED ORDER — IPRATROPIUM BROMIDE 0.02 % IN SOLN: 0.5000 mg | Freq: Four times a day (QID) | RESPIRATORY_TRACT | Status: DC | PRN

## 2015-03-04 MED ORDER — FUROSEMIDE 10 MG/ML IJ SOLN
40.0000 mg | Freq: Once | INTRAMUSCULAR | Status: AC
  Administered 2015-03-04: 40 mg via INTRAVENOUS
  Filled 2015-03-04: qty 4

## 2015-03-04 MED ORDER — ACETAMINOPHEN 650 MG RE SUPP: 650.0000 mg | Freq: Four times a day (QID) | RECTAL | Status: DC | PRN

## 2015-03-04 MED ORDER — AMLODIPINE BESYLATE 5 MG PO TABS
10.0000 mg | ORAL_TABLET | Freq: Every day | ORAL | Status: DC
  Administered 2015-03-05 – 2015-03-07 (×3): 10 mg via ORAL
  Filled 2015-03-04 (×3): qty 2

## 2015-03-04 NOTE — ED Notes (Signed)
Per MD Roderic Palau, nursing facility states they cannot accept patient back to facility if patient is not able ambulate. Agricultural consultant notified.

## 2015-03-04 NOTE — ED Notes (Signed)
Assisted patient from wheelchair back into bed. Pt unable to assist staff. 3 staff assist was needed to move patient.

## 2015-03-04 NOTE — ED Notes (Signed)
EKG done and given to EDP Dr Rolland Porter. Patient on cardiac monitor.

## 2015-03-04 NOTE — ED Notes (Signed)
Upon entering patient's room, patient was sleeping in bed. Nasal canula was pulled out of nares and lying on top of nose. O2 saturation 88% on room air. Advised patient of need to keep nasal canula in nares. Patient O2 saturation increased to 92% on 3L O2.

## 2015-03-04 NOTE — ED Notes (Signed)
[facility] called for transport back to [facility]. Patient was unable to get into vehicle for facility transport.

## 2015-03-04 NOTE — ED Notes (Signed)
Sob onset tonight, denies cp, history of chf

## 2015-03-04 NOTE — ED Provider Notes (Signed)
Pt was too weak to stand or ambulate.  Will admit to obs for weakness,  Extr. Edema and mild renal insuf  Milton Ferguson, MD 03/04/15 1031

## 2015-03-04 NOTE — ED Provider Notes (Signed)
CSN: [id number]     Arrival date & time 03/04/15  0348 History   First MD Initiated Contact with Patient 03/04/15 0402    Chief Complaint  Patient presents with  . Shortness of Breath   Level V caveat for poor historian  (Consider location/radiation/quality/duration/timing/severity/associated sxs/prior Treatment) HPI history difficult to obtain because patient does not answer a lot of questions. He states he's awake and he hears the question however he doesn't answer them. Patient states he was seen in the ED 2 days ago. He states he was still weak and having difficulty walking because of pain in his knee. He states he got short of breath tonight. He denies chest pain but does describe dry cough. He states he was told he had fever to 102 tonight at his facility. He is noted to be wheezing however he does not know when it started.  PCP Dr. Sherrie Sport in [place]  Past Medical History  Diagnosis Date  . Hypertension   . Hypercholesteremia   . Type II diabetes mellitus   . Diabetic peripheral neuropathy     "chronic" (05/27/2012)  . Stroke 05/2012    Subacute, lacunar infarcts within the left basal ganglia and posterior limp of the left internal capsule/thalamus; "RUE; both feet weak" (05/27/2012)  . Anxiety   . Spinal stenosis     mild lumbar (MRI 05/2012)-L2-L3 to L4-L5 , mild lumbar foraminal stenosis   . Chronic pain     legs, back; MRI 05/2012 with mild thoracic degenerative changes no spinal stenosis   . Depression   . PVD (peripheral vascular disease)   . Peripheral edema   . CHF (congestive heart failure)   . COPD (chronic obstructive pulmonary disease)   . Tremor    Past Surgical History  Procedure Laterality Date  . Hernia repair  01/05/2004    "belly button" (05/27/2012)  . Hernia repair  01/05/2004  . Ankle surgery     No family history on file. Social History  Substance Use Topics  . Smoking status: Former Smoker -- 0.50 packs/day for 45 years    Types: Cigarettes     Quit date: 12/28/2014  . Smokeless tobacco: Never Used  . Alcohol Use: No     Comment: 05/27/2012 "drank gallons and gallons 20 yr ago or so; last drink  at least 10 yr ago"   lives in a nursing facility  Review of Systems  Unable to perform ROS: Other      Allergies  Cucumber extract; Flexeril; and Kiwi extract  Home Medications   Prior to Admission medications   Medication Sig Start Date End Date Taking? Authorizing Provider  albuterol (PROVENTIL) (2.5 MG/3ML) 0.083% nebulizer solution Take 2.5 mg by nebulization every 3 (three) hours as needed for wheezing or shortness of breath.   Yes Historical Provider, MD  ALPRAZolam Duanne Moron) 1 MG tablet Take 1 mg by mouth 3 (three) times daily.   Yes Historical Provider, MD  amLODipine (NORVASC) 10 MG tablet Take 10 mg by mouth daily.   Yes Historical Provider, MD  aspirin EC 81 MG tablet Take 81 mg by mouth daily.   Yes Historical Provider, MD  atorvastatin (LIPITOR) 20 MG tablet Take 20 mg by mouth daily.    Yes Historical Provider, MD  calcium-vitamin D (OSCAL WITH D) 500-200 MG-UNIT per tablet Take 1 tablet by mouth 3 (three) times daily.   Yes Historical Provider, MD  citalopram (CELEXA) 20 MG tablet Take 20 mg by mouth daily.   Yes Historical  Provider, MD  fish oil-omega-3 fatty acids 1000 MG capsule Take 1 g by mouth 2 (two) times daily.   Yes Historical Provider, MD  Fluticasone-Salmeterol (ADVAIR) 250-50 MCG/DOSE AEPB Inhale 1 puff into the lungs every 12 (twelve) hours.   Yes Historical Provider, MD  furosemide (LASIX) 20 MG tablet Take 40 mg by mouth daily.    Yes Historical Provider, MD  gabapentin (NEURONTIN) 800 MG tablet Take 800 mg by mouth 3 (three) times daily.   Yes Historical Provider, MD  glimepiride (AMARYL) 2 MG tablet Take 2 mg by mouth daily with breakfast.   Yes Historical Provider, MD  ipratropium (ATROVENT) 0.02 % nebulizer solution Take 0.5 mg by nebulization every 6 (six) hours as needed for wheezing or shortness of  breath.   Yes Historical Provider, MD  lisinopril (PRINIVIL,ZESTRIL) 5 MG tablet Take 5 mg by mouth daily.   Yes Historical Provider, MD  metFORMIN (GLUCOPHAGE) 500 MG tablet Take 1,000 mg by mouth 2 (two) times daily with a meal.   Yes Historical Provider, MD  oxyCODONE (OXY IR/ROXICODONE) 5 MG immediate release tablet Take 1 tablet by mouth 3 (three) times daily. 01/24/15  Yes Historical Provider, MD  polyethylene glycol (MIRALAX) packet Take 17 g by mouth daily. Patient taking differently: Take 17 g by mouth daily as needed for mild constipation or moderate constipation.  04/09/14  Yes Kathie Dike, MD  sulfamethoxazole-trimethoprim (BACTRIM DS,SEPTRA DS) 800-160 MG tablet Take 1 tablet by mouth 2 (two) times daily.   Yes Historical Provider, MD  tiotropium (SPIRIVA) 18 MCG inhalation capsule Place 18 mcg into inhaler and inhale daily.   Yes Historical Provider, MD  triamcinolone cream (KENALOG) 0.1 % Apply 1 application topically daily. Apply to affected area of bilateral lower legs once daily during dressing change.   Yes Historical Provider, MD  AMBULATORY NON FORMULARY MEDICATION Oxycontin 10mg  tablet SA Sig: Take one tablet by mouth every 12 hours for pain. Hold for sedation. Do not crush Patient not taking: Reported on 07/20/2014 05/07/14   Tiffany L Reed, DO  HYDROcodone-acetaminophen (NORCO) 10-325 MG per tablet Take 1 tablet by mouth every 8 (eight) hours as needed for severe pain. Patient not taking: Reported on 01/28/2015 07/26/14   Radene Gunning, NP  ipratropium-albuterol (DUONEB) 0.5-2.5 (3) MG/3ML SOLN Take 3 mLs by nebulization every 6 (six) hours. Patient not taking: Reported on 08/30/2014 04/09/14   Kathie Dike, MD  oxyCODONE-acetaminophen (PERCOCET/ROXICET) 5-325 MG per tablet Take 1-2 tablets by mouth every 4 (four) hours as needed. Patient not taking: Reported on 01/28/2015 08/30/14   Virgel Manifold, MD   BP 127/64 mmHg  Pulse 81  Temp(Src) 98.2 F (36.8 C) (Oral)  Resp 18   Ht 6' (1.829 m)  Wt 300 lb (136.079 kg)  BMI 40.68 kg/m2  SpO2 99%  Vital signs normal   Physical Exam  Constitutional: He is oriented to person, place, and time. He appears well-developed and well-nourished.  Non-toxic appearance. He does not appear ill. No distress.  HENT:  Head: Normocephalic and atraumatic.  Right Ear: External ear normal.  Left Ear: External ear normal.  Nose: Nose normal. No mucosal edema or rhinorrhea.  Mouth/Throat: Oropharynx is clear and moist and mucous membranes are normal. No dental abscesses or uvula swelling.  Eyes: Conjunctivae and EOM are normal. Pupils are equal, round, and reactive to light.  Neck: Normal range of motion and full passive range of motion without pain. Neck supple.  Cardiovascular: Normal rate, regular rhythm and normal heart  sounds.  Exam reveals no gallop and no friction rub.   No murmur heard. Pulmonary/Chest: He is in respiratory distress. He has wheezes. He has no rhonchi. He has no rales. He exhibits no tenderness and no crepitus.  Patient is having audible expiratory wheezing  Abdominal: Soft. Normal appearance and bowel sounds are normal. He exhibits no distension. There is no tenderness. There is no rebound and no guarding.  Musculoskeletal: Normal range of motion. He exhibits edema. He exhibits no tenderness.  Patient's noted to have diffuse swelling of both  lower extremities with 1+ pitting edema. He has diffuse redness between his ankles and his knees. He does have some superficial breakdown of the skin especially on the left.  None of the areas appear infected  Neurological: He is alert and oriented to person, place, and time. He has normal strength. No cranial nerve deficit.  Skin: Skin is warm, dry and intact. No rash noted. No erythema. No pallor.  Psychiatric: He has a normal mood and affect. His speech is normal and behavior is normal. His mood appears not anxious.  Nursing note and vitals reviewed.   ED Course   Procedures (including critical care time)  Medications  ipratropium-albuterol (DUONEB) 0.5-2.5 (3) MG/3ML nebulizer solution 3 mL (3 mLs Nebulization Given 03/04/15 0430)  albuterol (PROVENTIL) (2.5 MG/3ML) 0.083% nebulizer solution 2.5 mg (2.5 mg Nebulization Given 03/04/15 0430)    Patient was rechecked at 5:40 AM. He no longer has audible wheezing. He states his breathing is better. When I auscultated his lungs there is no wheezing. He states he does not need another breathing treatment. He then states "did you x-ray my knee cap?". I asked patient why since he did not mention it before. He states he fell this morning and was on the floor and he had to have 3 people help him get up. He states he's having pain in his right knee. He did not relay this to myself or the nurses when he came to the ED.  After reviewing patient's x-ray he had Ace wrap put on his right knee. I did not put a knee immobilizer because his legs are= fairly thick and would probably not fit well. He also has some fragile skin of his lower legs and I was afraid it would cause some skin ulcerations or abrasions. He states he does not have an orthopedist. He will be referred to the two in town. He can choose which one he wants to see. Review of patient's medication list shows he has pain medication that he can take. We discussed wearing support stockings which he states he already wears however he does not have them on in the ED.  Labs Review Results for orders placed or performed during the hospital encounter of 03/04/15  Comprehensive metabolic panel  Result Value Ref Range   Sodium 142 135 - 145 mmol/L   Potassium 4.2 3.5 - 5.1 mmol/L   Chloride 104 101 - 111 mmol/L   CO2 32 22 - 32 mmol/L   Glucose, Bld 140 (H) 65 - 99 mg/dL   BUN 24 (H) 6 - 20 mg/dL   Creatinine, Ser 1.50 (H) 0.61 - 1.24 mg/dL   Calcium 9.8 8.9 - 10.3 mg/dL   Total Protein 7.5 6.5 - 8.1 g/dL   Albumin 3.9 3.5 - 5.0 g/dL   AST 40 15 - 41 U/L   ALT 44  17 - 63 U/L   Alkaline Phosphatase 99 38 - 126 U/L   Total Bilirubin  0.9 0.3 - 1.2 mg/dL   GFR calc non Af Amer 47 (L) >60 mL/min   GFR calc Af Amer 55 (L) >60 mL/min   Anion gap 6 5 - 15  CBC with Differential  Result Value Ref Range   WBC 9.8 4.0 - 10.5 K/uL   RBC 4.01 (L) 4.22 - 5.81 MIL/uL   Hemoglobin 12.9 (L) 13.0 - 17.0 g/dL   HCT 38.5 (L) 39.0 - 52.0 %   MCV 96.0 78.0 - 100.0 fL   MCH 32.2 26.0 - 34.0 pg   MCHC 33.5 30.0 - 36.0 g/dL   RDW 14.6 11.5 - 15.5 %   Platelets 199 150 - 400 K/uL   Neutrophils Relative % 76 %   Neutro Abs 7.5 1.7 - 7.7 K/uL   Lymphocytes Relative 12 %   Lymphs Abs 1.1 0.7 - 4.0 K/uL   Monocytes Relative 9 %   Monocytes Absolute 0.9 0.1 - 1.0 K/uL   Eosinophils Relative 3 %   Eosinophils Absolute 0.3 0.0 - 0.7 K/uL   Basophils Relative 0 %   Basophils Absolute 0.0 0.0 - 0.1 K/uL  Troponin I  Result Value Ref Range   Troponin I <0.03 <0.031 ng/mL  Brain natriuretic peptide  Result Value Ref Range   B Natriuretic Peptide 60.0 0.0 - 100.0 pg/mL   .Laboratory interpretation all normal except mild anemia that is stable, mild stable renal insufficiency     Imaging Review Dg Chest Port 1 View  03/04/2015   CLINICAL DATA:  Shortness of breath  EXAM: PORTABLE CHEST 1 VIEW  COMPARISON:  07/21/2014  FINDINGS: Chronic cardiomegaly. Negative aortic and hilar contours. Pulmonary venous congestion without edema or effusion. Pneumonia noted 07/21/2014 has resolved. No consolidation, collapse, or air leak.  IMPRESSION: Chronic cardiomegaly with pulmonary venous congestion.   Electronically Signed   By: Monte Fantasia M.D.   On: 03/04/2015 05:01   Dg Knee Complete 4 Views Right  03/04/2015   CLINICAL DATA:  Fall with right knee pain and swelling. Initial encounter.  EXAM: RIGHT KNEE - COMPLETE 4+ VIEW  COMPARISON:  Leg x-ray 07/16/2013  FINDINGS: Subcutaneous fat reticulation about the leg is chronic based on the previous study. Knee joint effusion is  present without visible fat component. No acute fracture or malalignment. Osteopenia.  IMPRESSION: Joint effusion without acute osseous finding.   Electronically Signed   By: Monte Fantasia M.D.   On: 03/04/2015 06:17   I have personally reviewed and evaluated these images and lab results as part of my medical decision-making.   EKG Interpretation   Date/Time:  Friday March 04 2015 04:00:26 EDT Ventricular Rate:  79 PR Interval:  185 QRS Duration: 106 QT Interval:  378 QTC Calculation: 433 R Axis:   -20 Text Interpretation:  Sinus rhythm Borderline left axis deviation Low  voltage, extremity and precordial leads Electrode noise No significant  change since last tracing 20 Jul 2014 Confirmed by KNAPP  MD-I, IVA  ([id number]) on 03/04/2015 4:16:27 AM      MDM   Final diagnoses:  Shortness of breath  Knee pain, acute, right   Plan discharge  Rolland Porter, MD, Barbette Or, MD 03/04/15 0730

## 2015-03-04 NOTE — Discharge Instructions (Signed)
Wear the Ace wrap to help support your knee. You can have your knee rechecked by an orthopedist if it continues to give any problems. I gave you the number for the two orthopedists in [place]. Call one and make an appointment. You should use your nebulizer as needed for shortness of breath. Recheck if you get worse such as fever, coughing up yellow sputum.

## 2015-03-04 NOTE — ED Notes (Signed)
MD Tomi Bamberger at bedside updating patient.

## 2015-03-04 NOTE — H&P (Signed)
History and Physical  Eric Scott [name] DOB: 30-Sep-1950 DOA: 03/04/2015  Referring physician: Rolland Porter, MD PCP: [name], MD   Chief Complaint: SOB, weakness  HPI:  59 yom with PMH of HTN, HLD, DM type 2, CVA, CHF, peripheral edema and COPD presented to the ED with SOB and generalized weakness. Patient also reported a fever at his assisted living and a nonproductive cough. While in the ED, his CXR and troponin were unremarkable. Labs revealed mildly elevated BUN/creatinine 24/1.50 but were otherwise stable. VSS. EDP planned discharge however the patient was not able to stand secondary to generalized weakness and so was referred for observation. His assisted living facility advised that he could not return unless able to ambulate.  Patient reports on 9/28 he fell twice, injuring his bilateral knees, R>L. He is unsure of the reason why he fell.  Since then he has had persisted pain in his bilateral knees and he is unable to ambulate secondary to pain. He is unaware of any alleviating factors, movement exacerbates his symptoms. Reports subjective fever and difficulty breathing last night. His SOB has improved since then.  Patient reports normal appetite. He has been eating and drinking well. He denies any nausea, vomiting, abdominal pain, diarrhea, or CP.  In the emergency department VSS, afebrile, not hypoxic Pertinent labs:  creatinine mildly elevated at 1.50,  LFTs unremarkable, BMP and troponin negative, CBC unremarkable. EKG: Independently reviewed.  SR, no acute changes Imaging: independently reviewed. CXR,  pulmonary venous congestion. No acute changes.  Knee xray, No acute findings, joint effusion.   Review of Systems:  Positive for bilateral knee pain, SOB, subjective fever. Negative for  visual changes, sore throat, rash,  chest pain,  dysuria, bleeding, n/v/abdominal pain.  Past Medical History  Diagnosis Date  . Hypertension   . Hypercholesteremia   . Type II  diabetes mellitus   . Diabetic peripheral neuropathy     "chronic" (05/27/2012)  . Stroke 05/2012    Subacute, lacunar infarcts within the left basal ganglia and posterior limp of the left internal capsule/thalamus; "RUE; both feet weak" (05/27/2012)  . Anxiety   . Spinal stenosis     mild lumbar (MRI 05/2012)-L2-L3 to L4-L5 , mild lumbar foraminal stenosis   . Chronic pain     legs, back; MRI 05/2012 with mild thoracic degenerative changes no spinal stenosis   . Depression   . PVD (peripheral vascular disease)   . Peripheral edema   . Diastolic CHF   . COPD (chronic obstructive pulmonary disease)   . Tremor     Past Surgical History  Procedure Laterality Date  . Hernia repair  01/05/2004    "belly button" (05/27/2012)  . Ankle surgery      Social History:  reports that he quit smoking about 2 months ago. His smoking use included Cigarettes. He has a 22.5 pack-year smoking history. He has never used smokeless tobacco. He reports that he does not drink alcohol or use illicit drugs. lives in an assisted living facility Partial assistance  Allergies  Allergen Reactions  . Cucumber Extract Other (See Comments)    "Fells like I'm having a heart attack"  . Flexeril [Cyclobenzaprine Hcl] Other (See Comments)    "whole body  Tremors" (05/27/2012)  . Kiwi Extract Other (See Comments)    "feels like I'm having a heart attack"    Family History  Problem Relation Age of Onset  . Family history unknown: Yes      Age of Onset  .  Family history unknown: Yes   Patient not aware of any particular medical problems in his family.  Prior to Admission medications   Medication Sig Start Date End Date Taking? Authorizing Provider  albuterol (PROVENTIL) (2.5 MG/3ML) 0.083% nebulizer solution Take 2.5 mg by nebulization every 3 (three) hours as needed for wheezing or shortness of breath.   Yes Historical Provider, MD  ALPRAZolam Duanne Moron) 1 MG tablet Take 1 mg by mouth 3 (three) times daily.    Yes Historical Provider, MD  amLODipine (NORVASC) 10 MG tablet Take 10 mg by mouth daily.   Yes Historical Provider, MD  aspirin EC 81 MG tablet Take 81 mg by mouth daily.   Yes Historical Provider, MD  atorvastatin (LIPITOR) 20 MG tablet Take 20 mg by mouth daily.    Yes Historical Provider, MD  calcium-vitamin D (OSCAL WITH D) 500-200 MG-UNIT per tablet Take 1 tablet by mouth 3 (three) times daily.   Yes Historical Provider, MD  citalopram (CELEXA) 20 MG tablet Take 20 mg by mouth daily.   Yes Historical Provider, MD  fish oil-omega-3 fatty acids 1000 MG capsule Take 1 g by mouth 2 (two) times daily.   Yes Historical Provider, MD  Fluticasone-Salmeterol (ADVAIR) 250-50 MCG/DOSE AEPB Inhale 1 puff into the lungs every 12 (twelve) hours.   Yes Historical Provider, MD  furosemide (LASIX) 20 MG tablet Take 40 mg by mouth daily.    Yes Historical Provider, MD  gabapentin (NEURONTIN) 800 MG tablet Take 800 mg by mouth 3 (three) times daily.   Yes Historical Provider, MD  glimepiride (AMARYL) 2 MG tablet Take 2 mg by mouth daily with breakfast.   Yes Historical Provider, MD  ipratropium (ATROVENT) 0.02 % nebulizer solution Take 0.5 mg by nebulization every 6 (six) hours as needed for wheezing or shortness of breath.   Yes Historical Provider, MD  lisinopril (PRINIVIL,ZESTRIL) 5 MG tablet Take 5 mg by mouth daily.   Yes Historical Provider, MD  metFORMIN (GLUCOPHAGE) 1000 MG tablet Take 1,000 mg by mouth 2 (two) times daily with a meal.  02/17/15  Yes Historical Provider, MD  oxyCODONE-acetaminophen (PERCOCET) 10-325 MG tablet Take 1 tablet by mouth 3 (three) times daily.  02/24/15  Yes Historical Provider, MD  polyethylene glycol (MIRALAX) packet Take 17 g by mouth daily. Patient taking differently: Take 17 g by mouth daily as needed for mild constipation or moderate constipation.  04/09/14  Yes Kathie Dike, MD  tiotropium (SPIRIVA) 18 MCG inhalation capsule Place 18 mcg into inhaler and inhale daily.    Yes Historical Provider, MD  triamcinolone cream (KENALOG) 0.1 % Apply 1 application topically daily. Apply to affected area of bilateral lower legs once daily during dressing change.   Yes Historical Provider, MD  HYDROcodone-acetaminophen (NORCO) 10-325 MG per tablet Take 1 tablet by mouth every 8 (eight) hours as needed for severe pain. Patient not taking: Reported on 01/28/2015 07/26/14   Radene Gunning, NP   Physical Exam: Filed Vitals:   03/04/15 0600 03/04/15 0631 03/04/15 0700 03/04/15 0804  BP: 137/72 148/71 160/72 164/86  Pulse:  80 80 78  Temp:    98.4 F (36.9 C)  TempSrc:    Oral  Resp:  15 15 20   Height:      Weight:      SpO2:  94% 94% 94%     VSS, Afebrile, not hypoxic General:  Appears calm and comfortable Eyes: PERRL, normal lids, irises & conjunctiva ENT: grossly normal hearing, lips & tongue  Neck: no LAD, masses or thyromegaly Cardiovascular: RRR, no m/r/g.  2/3+ pitting edema bilaterally, R>L, bilateral venous stasis changes with some open wounds on LLE Respiratory: CTA bilaterally, no w/r/r. Normal respiratory effort. Abdomen: soft, ntnd Skin: As above Musculoskeletal: Generally weak but moves all extremities to command. Psychiatric: Odd affect, speech is slow but is alert, oriented self, location, year. He thought it was October 1. Neurologic: Face symmetric, and generally nonfocal  Wt Readings from Last 3 Encounters:  03/04/15 136.079 kg (300 lb)  01/28/15 133.811 kg (295 lb)  08/30/14 127.007 kg (280 lb)    Labs on Admission:  Basic Metabolic Panel:  Recent Labs Lab 03/02/15 0950 03/04/15 0435  NA 139 142  K 4.2 4.2  CL 101 104  CO2 30 32  GLUCOSE 172* 140*  BUN 25* 24*  CREATININE 1.54* 1.50*  CALCIUM 8.7* 9.8    Liver Function Tests:  Recent Labs Lab 03/04/15 0435  AST 40  ALT 44  ALKPHOS 99  BILITOT 0.9  PROT 7.5  ALBUMIN 3.9    CBC:  Recent Labs Lab 03/02/15 0950 03/04/15 0435  WBC 8.0 9.8  NEUTROABS 5.8 7.5  HGB  12.8* 12.9*  HCT 38.1* 38.5*  MCV 96.2 96.0  PLT 193 199    Cardiac Enzymes:  Recent Labs Lab 03/04/15 0435  TROPONINI <0.03      Radiological Exams on Admission: Dg Chest Port 1 View  03/04/2015   CLINICAL DATA:  Shortness of breath  EXAM: PORTABLE CHEST 1 VIEW  COMPARISON:  07/21/2014  FINDINGS: Chronic cardiomegaly. Negative aortic and hilar contours. Pulmonary venous congestion without edema or effusion. Pneumonia noted 07/21/2014 has resolved. No consolidation, collapse, or air leak.  IMPRESSION: Chronic cardiomegaly with pulmonary venous congestion.   Electronically Signed   By: Monte Fantasia M.D.   On: 03/04/2015 05:01   Dg Knee Complete 4 Views Right  03/04/2015   CLINICAL DATA:  Fall with right knee pain and swelling. Initial encounter.  EXAM: RIGHT KNEE - COMPLETE 4+ VIEW  COMPARISON:  Leg x-ray 07/16/2013  FINDINGS: Subcutaneous fat reticulation about the leg is chronic based on the previous study. Knee joint effusion is present without visible fat component. No acute fracture or malalignment. Osteopenia.  IMPRESSION: Joint effusion without acute osseous finding.   Electronically Signed   By: Monte Fantasia M.D.   On: 03/04/2015 06:17     Principal Problem:   AKI (acute kidney injury) Active Problems:   Chronic diastolic CHF (congestive heart failure)   Diabetic peripheral neuropathy   Edema   Fall   Generalized weakness   Assessment/Plan 1. AKI, suspect multifactorial including Lasix, lisinopril, poor oral intake 2. Mutiple falls prior to admission. Suspect mechanical, but the patient cannot recall the details. Associated with right knee pain and effusion without apparent complication. Appears mildly confused. Wonder Xanax or narcotics are contributing. 3. Reported fever at facility. No fever recorded here, no leukocytosis. No evidence of infection at this point. 4. Generalized weakness without focal deficit 5. Chronic diastolic CHF, chronic lower extremity  edema noted, no pulmonary findings  6. COPD, stable. 7. DM type 2 with peripheral neuropathy, stable.  8. Tobacco use disorder. In remission.              Plan observation, the patient appears nontoxic  IVF, check BMP in a.m., hold Lasix and lisinopril  PT consult  BMP and CBC in AM  Check U/A  Code Status: Full  DVT prophylaxis:SCDs Family Communication: No family at  bedside. Discussed care plan with patient who is in agreement. All questions answered. Disposition Plan/Anticipated LOS: Obs. Discharge upon improvement.   Time spent: 55 minutes  Murray Hodgkins, MD  [facility] Pager [phone] 03/04/2015, 10:45 AM  By signing my name below, I, Rhett Bannister attest that this documentation has been prepared under the direction and in the presence of Murray Hodgkins, MD   Electronically signed: Rhett Bannister  03/04/2015 2:16pm  I personally performed the services described in this documentation. All medical record entries made by the scribe were at my direction. I have reviewed the chart and agree that the record reflects my personal performance and is accurate and complete. Murray Hodgkins, MD

## 2015-03-04 NOTE — ED Notes (Signed)
Unable to get patient into transport vehicle back to [facility]. Pt unable to stand and get into vehicle. Charge RN [place] notified. EMS called to transport patient.

## 2015-03-05 DIAGNOSIS — N179 Acute kidney failure, unspecified: Secondary | ICD-10-CM | POA: Diagnosis not present

## 2015-03-05 DIAGNOSIS — W19XXXA Unspecified fall, initial encounter: Secondary | ICD-10-CM | POA: Diagnosis not present

## 2015-03-05 DIAGNOSIS — I5032 Chronic diastolic (congestive) heart failure: Secondary | ICD-10-CM | POA: Diagnosis not present

## 2015-03-05 DIAGNOSIS — R531 Weakness: Secondary | ICD-10-CM | POA: Diagnosis not present

## 2015-03-05 LAB — CBC
HCT: 38.1 % — ABNORMAL LOW (ref 39.0–52.0)
Hemoglobin: 13 g/dL (ref 13.0–17.0)
MCH: 32.4 pg (ref 26.0–34.0)
MCHC: 34.1 g/dL (ref 30.0–36.0)
MCV: 95 fL (ref 78.0–100.0)
PLATELETS: 185 10*3/uL (ref 150–400)
RBC: 4.01 MIL/uL — AB (ref 4.22–5.81)
RDW: 14.5 % (ref 11.5–15.5)
WBC: 7.2 10*3/uL (ref 4.0–10.5)

## 2015-03-05 LAB — BASIC METABOLIC PANEL
Anion gap: 7 (ref 5–15)
BUN: 19 mg/dL (ref 6–20)
CHLORIDE: 105 mmol/L (ref 101–111)
CO2: 27 mmol/L (ref 22–32)
CREATININE: 1.25 mg/dL — AB (ref 0.61–1.24)
Calcium: 8.3 mg/dL — ABNORMAL LOW (ref 8.9–10.3)
GFR calc Af Amer: >60 mL/min (ref >60)
GFR calc non Af Amer: 59 mL/min — ABNORMAL LOW (ref >60)
Glucose, Bld: 149 mg/dL — ABNORMAL HIGH (ref 65–99)
Potassium: 3.7 mmol/L (ref 3.5–5.1)
Sodium: 139 mmol/L (ref 135–145)

## 2015-03-05 LAB — GLUCOSE, CAPILLARY
GLUCOSE-CAPILLARY: 156 mg/dL — AB (ref 65–99)
GLUCOSE-CAPILLARY: 169 mg/dL — AB (ref 65–99)
Glucose-Capillary: 206 mg/dL — ABNORMAL HIGH (ref 65–99)
Glucose-Capillary: 137 mg/dL — ABNORMAL HIGH (ref 65–99)
Glucose-Capillary: 153 mg/dL — ABNORMAL HIGH (ref 65–99)

## 2015-03-05 MED ORDER — MUPIROCIN 2 % EX OINT
1.0000 "application " | TOPICAL_OINTMENT | Freq: Two times a day (BID) | CUTANEOUS | Status: DC
  Administered 2015-03-05 – 2015-03-07 (×5): 1 via NASAL
  Filled 2015-03-05: qty 22

## 2015-03-05 MED ORDER — ALPRAZOLAM 0.5 MG PO TABS
0.5000 mg | ORAL_TABLET | Freq: Three times a day (TID) | ORAL | Status: DC
  Administered 2015-03-05 – 2015-03-07 (×6): 0.5 mg via ORAL
  Filled 2015-03-05 (×6): qty 1

## 2015-03-05 MED ORDER — CHLORHEXIDINE GLUCONATE CLOTH 2 % EX PADS
6.0000 | MEDICATED_PAD | Freq: Every day | CUTANEOUS | Status: DC
  Administered 2015-03-05: 6 via TOPICAL

## 2015-03-05 MED ORDER — OXYCODONE-ACETAMINOPHEN 5-325 MG PO TABS
1.0000 | ORAL_TABLET | Freq: Four times a day (QID) | ORAL | Status: DC | PRN
  Administered 2015-03-05 – 2015-03-07 (×3): 1 via ORAL
  Filled 2015-03-05 (×3): qty 1

## 2015-03-05 NOTE — Progress Notes (Signed)
PROGRESS NOTE  Eric Scott [id number] DOB: 05-27-1951 DOA: 03/04/2015 PCP: [name], MD  Summary: 66 yom with PMH of HTN, HLD, DM type 2, CVA, CHF, peripheral edema and COPD presented to the ED with SOB and generalized weakness. Patient also reported a fever at his assisted living and a nonproductive cough. While in the ED, his CXR and troponin were unremarkable. Labs revealed mildly elevated BUN/creatinine 24/1.50 but were otherwise stable. VSS. EDP planned discharge however the patient was not able to stand secondary to generalized weakness and so was referred for observation. His assisted living facility advised that he could not return unless able to ambulate.  Assessment/Plan: 1. AKI, suspect multifactorial including Lasix, lisinopril, poor oral intake. Resolved with IVF. 2. Mutiple falls prior to admission. Suspect mechanical, but the patient cannot recall the details. Associated with right knee pain and effusion without apparent complication. Wonder Xanax or narcotics are contributing. No focal findings. 3. Reported fever at facility. No fever recorded here, no leukocytosis. No evidence of infection at this point. 4. Generalized weakness without focal deficit, improved. Increased BLE strength. 5. Chronic diastolic CHF, chronic lower extremity edema noted, no pulmonary findings  6. COPD, stable. 7. DM type 2 with peripheral neuropathy, stable.  8. Tobacco use disorder. In remission.   Overall improved. Await PT evaluation.  Discharge tomorrow pending PT evaluation.   Code Status: Full DVT prophylaxis: Lovenox Family Communication: No family at bedside. Discussed care plan with patient who is in agreement. All questions answered. Disposition Plan: Anticipate discharge tomorrow.   Murray Hodgkins, MD  [facility]  Pager [phone] If 7PM-7AM, please contact night-coverage at [email or web address], password [facility] 03/05/2015, 6:14 AM     Consultants:    Procedures:    Antibiotics:    HPI/Subjective: Feels "rough". Has not had an appetite all day. C/o right knee pain.  Denies SOB nausea or vomiting.   Objective: Filed Vitals:   03/04/15 1148 03/04/15 1559 03/04/15 1924 03/04/15 2132  BP: 158/65 139/52  147/63  Pulse: 81 76  81  Temp: 98.1 F (36.7 C) 98.6 F (37 C)  98.4 F (36.9 C)  TempSrc: Oral Oral  Oral  Resp: 20 20  18   Height: 6' (1.829 m)     Weight: 142.9 kg (315 lb 0.6 oz)     SpO2: 100% 96% 93% 95%    Intake/Output Summary (Last 24 hours) at 03/05/15 0614 Last data filed at 03/04/15 1841  Gross per 24 hour  Intake      0 ml  Output    850 ml  Net   -850 ml     Filed Weights   03/04/15 0356 03/04/15 1148  Weight: 136.079 kg (300 lb) 142.9 kg (315 lb 0.6 oz)    Exam:    VSS, afebrile, not hypoxic General:  Appears calm and comfortable Eyes: PERRL, normal lids, irises & conjunctiva ENT: grossly normal hearing, lips & tongue Cardiovascular: RRR, no m/r/g.   LLE chronic venous stasis changes, 1+ ankle edema. Good strength in flexion and extension of left knee with no swelling. RLE chronic venous stasis changes, 2+ edema, greater than left. Right knee has small effusion. No TTP. No erythema or break in the skin. Flexes to 90 degrees. Extends close to 180 degrees.  Respiratory: CTA bilaterally, no w/r/r. Normal respiratory effort. Abdomen: soft, ntnd Musculoskeletal: grossly normal tone BUE/BLE Psychiatric: grossly normal mood and affect, speech fluent and appropriate Neurologic: grossly non-focal.  New data reviewed:  Creatinine improved, 1.25. BUN has  normalized  CBC unremarkable.  Pertinent data since admission:  CXR IMPRESSION: Chronic cardiomegaly with pulmonary venous congestion.  Right knee xray IMPRESSION: Joint effusion without acute osseous finding.  Pending data:    Scheduled Meds: . amLODipine  10 mg Oral Daily  . aspirin EC  81 mg Oral Daily  .  atorvastatin  20 mg Oral Daily  . citalopram  20 mg Oral Daily  . enoxaparin (LOVENOX) injection  40 mg Subcutaneous Q24H  . Influenza vac split quadrivalent PF  0.5 mL Intramuscular Tomorrow-1000  . insulin aspart  0-5 Units Subcutaneous QHS  . insulin aspart  0-9 Units Subcutaneous TID WC  . mometasone-formoterol  2 puff Inhalation BID  . pneumococcal 23 valent vaccine  0.5 mL Intramuscular Tomorrow-1000  . tiotropium  18 mcg Inhalation Daily   Continuous Infusions: . sodium chloride 50 mL/hr at 03/05/15 0210    Principal Problem:   AKI (acute kidney injury) Active Problems:   Chronic diastolic CHF (congestive heart failure)   Diabetic peripheral neuropathy   Edema   Fall   Generalized weakness   Time spent 25 minutes   By signing my name below, I, Rosalie Doctor attest that this documentation has been prepared under the direction and in the presence of Murray Hodgkins, MD Electronically signed: Rosalie Doctor, Scribe.  03/05/2015 1:32pm  I personally performed the services described in this documentation. All medical record entries made by the scribe were at my direction. I have reviewed the chart and agree that the record reflects my personal performance and is accurate and complete. Murray Hodgkins, MD

## 2015-03-05 NOTE — Evaluation (Signed)
Physical Therapy Evaluation Patient Details Name: Eric Scott MRN: [id number] DOB: 02-14-1951 Today's Date: 03/05/2015   History of Present Illness  Pt is a 64 yo white male with a history of DM, HTN, HLD, CVA, peripheral neuropathy, CHF and edema.  Pt is a resident of [facility] where he reports he uses WC mostly for mobility, with some very limited distance AMB with SPC, which correlates well to previos visit 7  months ago. Pt reportedly had two falls on 9/28, adn has had severe knee pain bilat L>R which has limited him from walking.   Clinical Impression  Pt is alert and somewhat oriented during evaluation, able to answer most questions regarding history with certainty. It sounds at though pt has been experiencing a progressive decline in mobility over the last several months, typically using WC for mobility, but able to walk very limited distances with SPC. Pt presenting today with significant loss of sensation and peripheral neuropathy in BLE up to the knee level, as well as impaired skin integrity and pitting edema bilaterally. Knee pain pertaining to recent falls is inconclusive, as pt reports he typically has some knee pain, however R knee extension ROM is limited and pain full, and it is unclear as to whether this is an acute or remote problem. Pt also demonstrating significant strength impairments in BLE and trunk, as well as gross motor deficits likely related to decline in proprioception. Pt is morbidly obese and when combined with severe weakness, requires Max-total A +2 for all mobility needs, hence PT is recommending a lift for all transfers with nursing at this time. Pt will benefit from skilled intervention to address the above impairments and to restore PLOF in transfers, bed mobility, and short distance ambulation, in order to decrease caregiver burden and to improve quality of life. Recommending STR stay to address all rehab needs.      Follow Up Recommendations  SNF;Supervision/Assistance - 24 hour    Equipment Recommendations  None recommended by PT    Recommendations for Other Services       Precautions / Restrictions Precautions Precautions: Fall Restrictions Weight Bearing Restrictions: No      Mobility  Bed Mobility Overal bed mobility: +2 for physical assistance;Needs Assistance Bed Mobility: Supine to Sit;Sit to Supine     Supine to sit: Max assist;+2 for physical assistance Sit to supine: Mod assist;+2 for physical assistance      Transfers Overall transfer level: Needs assistance Equipment used: Rolling walker (2 wheeled) Transfers: Sit to/from Stand Sit to Stand: Total assist         General transfer comment: Very weak and labored effort to remain standing for 45 seconds. Unable to take steps. PT recommending lift for all nursing transfers at this time.   Ambulation/Gait             General Gait Details: Unable.   Stairs            Wheelchair Mobility    Modified Rankin (Stroke Patients Only)       Balance Overall balance assessment: Needs assistance;History of Falls Sitting-balance support: Single extremity supported Sitting balance-Leahy Scale: Poor     Standing balance support: Bilateral upper extremity supported Standing balance-Leahy Scale: Zero                               Pertinent Vitals/Pain Pain Assessment: 0-10 Pain Location: Difficult to get logical and timely responses regarding to  pain, but says his L knee hirts, as well as his R patella, worse with passive extension.  Pain Intervention(s): Limited activity within patient's tolerance;Monitored during session;Repositioned    Home Living Family/patient expects to be discharged to:: Assisted living               Home Equipment: Walker - 2 wheels;Wheelchair - manual;Cane - single point      Prior Function Level of Independence: Needs assistance   Gait / Transfers Assistance Needed: Reports he was  getting assistance with bathing, dressing and sometimes toileting. WC for most mobility with intermittent short distance ambulation.            Hand Dominance        Extremity/Trunk Assessment   Upper Extremity Assessment: Overall WFL for tasks assessed (Essential tremor in BUE, worse on L, only present with exersion or high level mentation. )           Lower Extremity Assessment: Generalized weakness;RLE deficits/detail;LLE deficits/detail RLE Deficits / Details: Strength is grossly 2/5 in ankles, 2/5 in quads, 3+/5 in hamstrings, 4/5 in hip flexors;significant pitting edema throughout foot and ankle.  LLE Deficits / Details: Strength is grossly 2/5 in ankles, 2/5 in quads, 3+/5 in hamstrings, 3+/5 in hip flexors; significant pitting edema throughout foot and ankle. left anterior lower leg non-blanching erethema and  several scabs, as large as a dime.   Cervical / Trunk Assessment:  (Very weak and limited. )  Communication   Communication: No difficulties;Expressive difficulties  Cognition Arousal/Alertness: Awake/alert Behavior During Therapy: Flat affect Overall Cognitive Status: Difficult to assess (mostly a god historian but reports that it is 1960.  Knows he is in [facility] hospital, but cannot name city/county. )                      General Comments      Exercises General Exercises - Lower Extremity Short Arc Quad: AAROM;Strengthening;Both;10 reps;Seated (Mod-Max A for coordination and full range. ) Hip Flexion/Marching: AAROM;Strengthening;Both;10 reps;Seated (Mod-Max A for coordination and full range. ) Toe Raises: AAROM;Strengthening;Both;10 reps;Seated (Mod-Max A for coordination and full range. ) Heel Raises: AAROM;Strengthening;Both;10 reps;Seated (Mod-Max A for coordination and full range. )      Assessment/Plan    PT Assessment Patient needs continued PT services  PT Diagnosis Difficulty walking;Generalized weakness;Acute pain;Abnormality of  gait;Altered mental status   PT Problem List Decreased strength;Decreased range of motion;Decreased activity tolerance;Decreased balance;Decreased mobility;Decreased coordination;Decreased cognition;Impaired sensation;Obesity;Decreased skin integrity;Pain  PT Treatment Interventions Gait training;Functional mobility training;Therapeutic activities;Therapeutic exercise;Balance training;Patient/family education   PT Goals (Current goals can be found in the Care Plan section) Acute Rehab PT Goals Patient Stated Goal: Return to [facility] and receive PT there.  PT Goal Formulation: With patient Time For Goal Achievement: 03/19/15 Potential to Achieve Goals: Fair    Frequency Min 3X/week   Barriers to discharge   Must be able to walk to return to [facility] (per RN)     Co-evaluation               End of Session Equipment Utilized During Treatment: Gait belt Activity Tolerance: Patient limited by fatigue;Patient limited by lethargy;Patient limited by pain Patient left: in bed;with call bell/phone within reach;with bed alarm set Nurse Communication: Mobility status;Need for lift equipment;Weight bearing status    Functional Assessment Tool Used: Clinical Judgment Functional Limitation: Changing and maintaining body position Changing and Maintaining Body Position Current Status [id number]): At least 60 percent but less than 80  percent impaired, limited or restricted Changing and Maintaining Body Position Goal Status [phone]): At least 60 percent but less than 80 percent impaired, limited or restricted    Time: 0412-0440 PT Time Calculation (min) (ACUTE ONLY): 28 min   Charges:   PT Evaluation $Initial PT Evaluation Tier I: 1 Procedure PT Treatments $Therapeutic Exercise: 8-22 mins   PT G Codes:   PT G-Codes **NOT FOR INPATIENT CLASS** Functional Assessment Tool Used: Clinical Judgment Functional Limitation: Changing and maintaining body position Changing and Maintaining Body  Position Current Status [id number]): At least 60 percent but less than 80 percent impaired, limited or restricted Changing and Maintaining Body Position Goal Status [id number]): At least 60 percent but less than 80 percent impaired, limited or restricted    Buccola,Allan C 03/05/2015, 5:35 PM  5:42 PM  Etta Grandchild, PT, DPT [place] License # [id number]

## 2015-03-05 NOTE — Plan of Care (Signed)
Problem: Acute Rehab PT Goals(only PT should resolve) Goal: Pt Will Go Supine/Side To Sit Pt will demonstrate MinA bed mobility supine to sitting edge-of-bed to return to PLOF and to decrease caregiver burden.     Goal: Pt Will Transfer Bed To Chair/Chair To Bed Pt will transfer sit to/from-stand with RW at MinA +2 without loss-of-balance to demonstrate good safety awareness for independent mobility in home.     Goal: Pt Will Ambulate Pt will ambulate with RW at [facility] using a step-to pattern and equal step length for a distances greater than 64ft to demonstrate the ability to perform safe household distance ambulation at discharge.

## 2015-03-06 DIAGNOSIS — E1142 Type 2 diabetes mellitus with diabetic polyneuropathy: Secondary | ICD-10-CM | POA: Diagnosis not present

## 2015-03-06 DIAGNOSIS — W19XXXA Unspecified fall, initial encounter: Secondary | ICD-10-CM | POA: Diagnosis not present

## 2015-03-06 DIAGNOSIS — I5032 Chronic diastolic (congestive) heart failure: Secondary | ICD-10-CM | POA: Diagnosis not present

## 2015-03-06 DIAGNOSIS — N179 Acute kidney failure, unspecified: Secondary | ICD-10-CM | POA: Diagnosis not present

## 2015-03-06 LAB — GLUCOSE, CAPILLARY
GLUCOSE-CAPILLARY: 170 mg/dL — AB (ref 65–99)
GLUCOSE-CAPILLARY: 172 mg/dL — AB (ref 65–99)
GLUCOSE-CAPILLARY: 163 mg/dL — AB (ref 65–99)
Glucose-Capillary: 180 mg/dL — ABNORMAL HIGH (ref 65–99)

## 2015-03-06 NOTE — Progress Notes (Signed)
PROGRESS NOTE  Eric Scott [name] DOB: [birth year] DOA: 03/04/2015 PCP: [name], MD  Summary: 50 yom with PMH of HTN, HLD, DM type 2, CVA, CHF, peripheral edema and COPD presented to the ED with SOB and generalized weakness. Patient also reported a fever at his assisted living and a nonproductive cough. While in the ED, his CXR and troponin were unremarkable. Labs revealed mildly elevated BUN/creatinine 24/1.50 but were otherwise stable. VSS. EDP planned discharge however the patient was not able to stand secondary to generalized weakness and so was referred for observation. His assisted living facility advised that he could not return unless able to ambulate.  Assessment/Plan: 1. AKI, suspect multifactorial including Lasix, lisinopril, poor oral intake. Resolved with IVF. 2. Mutiple falls prior to admission. Suspect mechanical, but the patient cannot recall the details. Associated with right knee pain and effusion without apparent complication. Consider Xanax or narcotics as contributing factors. No focal findings. Per PT, plan SNF.  3. Reported fever at facility. No fever as inpatient, no leukocytosis. No evidence of infection at this point. 4. Generalized weakness without focal deficit, improved. Increased BLE strength. 5. Chronic diastolic CHF, chronic lower extremity edema noted, no pulmonary findings  6. COPD, stable. 7. DM type 2 with peripheral neuropathy, remains stable.  8. Tobacco use disorder. In remission.   Overall improved, plan on rehab tomorrow  Code Status: Full DVT prophylaxis: Lovenox Family Communication: No family at bedside. Discussed care plan with patient and there are no questions at this time.  Disposition Plan: Anticipate discharge tomorrow.   Murray Hodgkins, MD  [facility]  Pager [phone] If 7PM-7AM, please contact night-coverage at [email or web address], password [facility] 03/06/2015, 7:01 AM    Consultants:  PT- SNF;Supervision/Assistance  - 24 hour and fall precautions    Procedures:    Antibiotics:    HPI/Subjective: Has pain in both legs. Right knee is worse than left. Eating okay. No n/v.    Objective: Filed Vitals:   03/05/15 1323 03/05/15 1949 03/05/15 2122 03/06/15 0610  BP: 149/81  150/68 142/69  Pulse: 84  80 77  Temp: 98.3 F (36.8 C)  98.5 F (36.9 C) 98.4 F (36.9 C)  TempSrc: Oral  Oral Oral  Resp: 18  18 18   Height:      Weight:      SpO2: 95% 96% 93% 93%    Intake/Output Summary (Last 24 hours) at 03/06/15 0701 Last data filed at 03/05/15 1736  Gross per 24 hour  Intake    720 ml  Output    500 ml  Net    220 ml     Filed Weights   03/04/15 0356 03/04/15 1148  Weight: 136.079 kg (300 lb) 142.9 kg (315 lb 0.6 oz)    Exam:     afebrile, not hypoxic General:  Appears comfortable, calm. Lying in bed  Cardiovascular: Regular rate and rhythm, no murmur, rub or gallop. 2+ edema R>L Respiratory: Clear to auscultation bilaterally, no wheezes, rales or rhonchi. Normal respiratory effort. Abdomen: soft, ntnd Skin: bilateral venous status changes without evidence of infection. Ecchymosis inferior to right patella Musculoskeletal: lifts both legs off bed. Joint effusion right knee noted, improving ROM right knee Psychiatric: grossly normal mood and affect, speech fluent and appropriate Neurologic: grossly non-focal.  New data reviewed:  CBG stable, less than 200  Pertinent data since admission:  CXR IMPRESSION: Chronic cardiomegaly with pulmonary venous congestion.  Right knee xray IMPRESSION: Joint effusion without acute osseous finding.  Pending data:  Scheduled Meds: . ALPRAZolam  0.5 mg Oral TID  . amLODipine  10 mg Oral Daily  . aspirin EC  81 mg Oral Daily  . atorvastatin  20 mg Oral Daily  . Chlorhexidine Gluconate Cloth  6 each Topical Q0600  . citalopram  20 mg Oral Daily  . enoxaparin (LOVENOX) injection  40 mg Subcutaneous Q24H  . insulin aspart  0-5 Units  Subcutaneous QHS  . insulin aspart  0-9 Units Subcutaneous TID WC  . mometasone-formoterol  2 puff Inhalation BID  . mupirocin ointment  1 application Nasal BID  . tiotropium  18 mcg Inhalation Daily   Continuous Infusions:    Principal Problem:   AKI (acute kidney injury) ([place]) Active Problems:   Chronic diastolic CHF (congestive heart failure) ([name])   Diabetic peripheral neuropathy ([name])   Edema   Fall   Generalized weakness   Time spent 25 minutes  By signing my name below, I, [name], attest that this documentation has been prepared under the direction and in the presence of [place]. Sarajane Jews, MD. Electronically signed: Salvadore Oxford. 03/06/2015 11:41 AM   I personally performed the services described in this documentation. All medical record entries made by the scribe were at my direction. I have reviewed the chart and agree that the record reflects my personal performance and is accurate and complete. Murray Hodgkins, MD

## 2015-03-07 DIAGNOSIS — N179 Acute kidney failure, unspecified: Secondary | ICD-10-CM | POA: Diagnosis not present

## 2015-03-07 DIAGNOSIS — I5032 Chronic diastolic (congestive) heart failure: Secondary | ICD-10-CM | POA: Diagnosis not present

## 2015-03-07 DIAGNOSIS — W19XXXA Unspecified fall, initial encounter: Secondary | ICD-10-CM | POA: Diagnosis not present

## 2015-03-07 DIAGNOSIS — E1142 Type 2 diabetes mellitus with diabetic polyneuropathy: Secondary | ICD-10-CM

## 2015-03-07 LAB — GLUCOSE, CAPILLARY
GLUCOSE-CAPILLARY: 168 mg/dL — AB (ref 65–99)
Glucose-Capillary: 169 mg/dL — ABNORMAL HIGH (ref 65–99)

## 2015-03-07 MED ORDER — ALPRAZOLAM 1 MG PO TABS: 1.0000 mg | ORAL_TABLET | Freq: Three times a day (TID) | ORAL | Status: DC

## 2015-03-07 MED ORDER — OXYCODONE-ACETAMINOPHEN 10-325 MG PO TABS: 1.0000 | ORAL_TABLET | Freq: Three times a day (TID) | ORAL | Status: DC | PRN

## 2015-03-07 NOTE — Care Management Note (Signed)
Case Management Note  Patient Details  Name: Eric Scott MRN: [id number] Date of Birth: [birth year]  Subjective/Objective:                  Pt admitted from [facility] with AKI. Per facility pt needs higher level of care at discharge.   Action/Plan: Pt for discharge today to [facility]. CSW to arrange discharge to facility.  Expected Discharge Date:                  Expected Discharge Plan:  Skilled Nursing Facility  In-House Referral:  Clinical Social Work  Discharge planning Services  CM Consult  Post Acute Care Choice:  NA Choice offered to:  NA  DME Arranged:    DME Agency:     HH Arranged:    [place] Agency:     Status of Service:  Completed, signed off  Medicare Important Message Given:    Date Medicare IM Given:    Medicare IM give by:    Date Additional Medicare IM Given:    Additional Medicare Important Message give by:     If discussed at [place] of Stay Meetings, dates discussed:    Additional Comments:  Joylene Draft, RN 03/07/2015, 1:11 PM

## 2015-03-07 NOTE — Progress Notes (Signed)
PROGRESS NOTE  Eric Scott [name] DOB: 09-Jul-1950 DOA: 03/04/2015 PCP: [name], MD  Summary: 32 yom with PMH of HTN, HLD, DM type 2, CVA, CHF, peripheral edema and COPD presented to the ED with SOB and generalized weakness. Patient also reported a fever at his assisted living and a nonproductive cough. While in the ED, his CXR and troponin were unremarkable. Labs revealed mildly elevated BUN/creatinine 24/1.50 but were otherwise stable. VSS. EDP planned discharge however the patient was not able to stand secondary to generalized weakness and so was referred for observation. His assisted living facility advised that he could not return unless able to ambulate.  Assessment/Plan: 1. AKI, suspect multifactorial including Lasix, lisinopril, poor oral intake. Resolved. Adequate oral intake. 2. Mutiple falls prior to admission. Suspect mechanical, likely contributor is poor oral intake, but the patient cannot recall the details. Associated with right knee pain and effusion without apparent complication. Consider Xanax or narcotics as contributing factors. No focal findings. Per PT, plan SNF.  3. Reported fever at facility. No fever as inpatient, no leukocytosis. No evidence of infection. 4. Generalized weakness without focal deficit. 5. Chronic diastolic CHF, chronic lower extremity edema noted, no pulmonary findings  6. COPD, stable. 7. DM type 2 with peripheral neuropathy, remains stable.  8. Tobacco use disorder. In remission.   Overall improved,  Plan for discharge to SNF  As recommended by physical therapy Code Status: Full DVT prophylaxis: Lovenox Family Communication: No family at bedside. Discussed care plan with patient and there are no questions at this time.  Disposition Plan: Discharge to SNF today.    Murray Hodgkins, MD  [facility]  Pager [phone] If 7PM-7AM, please contact night-coverage at [email or web address], password [facility] 03/07/2015, 7:27 AM  LOS: 1 day    Consultants:  PT- SNF;Supervision/Assistance - 24 hour and fall precautions    Procedures:    Antibiotics:    HPI/Subjective: Feels "terrible". Poor sleep. Denies nausea, vomiting, and SOB. Pain in right leg is about the same and the left is slightly better. Poor appetite. Chronic tremor.  Objective: Filed Vitals:   03/06/15 1504 03/06/15 2006 03/06/15 2025 03/07/15 0654  BP: 144/66  142/67 137/66  Pulse: 74  75 75  Temp: 98.4 F (36.9 C)  98.2 F (36.8 C) 97.8 F (36.6 C)  TempSrc: Oral  Oral Oral  Resp: 18  20 20   Height:      Weight:      SpO2: 94% 95% 95% 96%    Intake/Output Summary (Last 24 hours) at 03/07/15 0727 Last data filed at 03/06/15 1728  Gross per 24 hour  Intake    600 ml  Output      0 ml  Net    600 ml     Filed Weights   03/04/15 0356 03/04/15 1148  Weight: 136.079 kg (300 lb) 142.9 kg (315 lb 0.6 oz)    Exam: afebrile, not hypoxic, VSS General:  Appears calm and comfortable Eyes: PERRL, normal lids, irises & conjunctiva ENT: grossly normal hearing, lips & tongue Neck: no LAD, masses or thyromegaly Cardiovascular: RRR, no m/r/g. 1+ BLE right greater then left. Improved. Respiratory: CTA bilaterally, no w/r/r. Normal respiratory effort. Abdomen: soft, ntnd Skin: Chronic venous stasis changes on LE.  Musculoskeletal: Right knee minimal bruising noted on the lower patella, no change in joint effusion non tender. Able to lift both legs and bend both knees. Psychiatric: grossly normal mood and affect, speech fluent and appropriate  New data reviewed:  CBG stable  Pertinent data since admission:  CXR IMPRESSION: Chronic cardiomegaly with pulmonary venous congestion.  Right knee xray IMPRESSION: Joint effusion without acute osseous finding.  Pending data:    Scheduled Meds: . ALPRAZolam  0.5 mg Oral TID  . amLODipine  10 mg Oral Daily  . aspirin EC  81 mg Oral Daily  . atorvastatin  20 mg Oral Daily  . Chlorhexidine  Gluconate Cloth  6 each Topical Q0600  . citalopram  20 mg Oral Daily  . enoxaparin (LOVENOX) injection  40 mg Subcutaneous Q24H  . insulin aspart  0-5 Units Subcutaneous QHS  . insulin aspart  0-9 Units Subcutaneous TID WC  . mometasone-formoterol  2 puff Inhalation BID  . mupirocin ointment  1 application Nasal BID  . tiotropium  18 mcg Inhalation Daily   Continuous Infusions:    Principal Problem:   AKI (acute kidney injury) ([place]) Active Problems:   Chronic diastolic CHF (congestive heart failure) ([place])   Diabetic peripheral neuropathy ([place])   Edema   Fall   Generalized weakness    By signing my name below, I, Rennis Harding attest that this documentation has been prepared under the direction and in the presence of Murray Hodgkins, MD Electronically signed: Rennis Harding  03/07/2015 11:43 AM  I personally performed the services described in this documentation. All medical record entries made by the scribe were at my direction. I have reviewed the chart and agree that the record reflects my personal performance and is accurate and complete. Murray Hodgkins, MD

## 2015-03-07 NOTE — Progress Notes (Signed)
Pt given discharge instructions and care notes. Pt verbalized understanding AEB no further questions or concerns at this time. IV was discontinued, no redness, pain, or swelling noted at this time.  Pt left the floor via EMS with staff in stable condition to [facility]. Report given to Jacquenette Shone, RN at [facility] facility. [place].

## 2015-03-07 NOTE — Clinical Social Work Placement (Signed)
   CLINICAL SOCIAL WORK PLACEMENT  NOTE  Date:  03/07/2015  Patient Details  Name: Eric Scott MRN: [id number] Date of Birth: 05-12-1951  Clinical Social Work is seeking post-discharge placement for this patient at the [place] level of care (*CSW will initial, date and re-position this form in  chart as items are completed):  Yes   Patient/family provided with [facility] Work Department's list of facilities offering this level of care within the geographic area requested by the patient (or if unable, by the patient's family).  Yes   Patient/family informed of their freedom to choose among providers that offer the needed level of care, that participate in Medicare, Medicaid or managed care program needed by the patient, have an available bed and are willing to accept the patient.  Yes   Patient/family informed of [facility]'s ownership interest in [facility] and [facility], as well as of the fact that they are under no obligation to receive care at these facilities.  PASRR submitted to EDS on 03/07/15     PASRR number received on 03/07/15     Existing PASRR number confirmed on       FL2 transmitted to all facilities in geographic area requested by pt/family on 03/07/15     FL2 transmitted to all facilities within larger geographic area on       Patient informed that his/her managed care company has contracts with or will negotiate with certain facilities, including the following:        Yes   Patient/family informed of bed offers received.  Patient chooses bed at [facility] [facility]     Physician recommends and patient chooses bed at      Patient to be transferred to [facility] at [place] on 03/07/15.  Patient to be transferred to facility by [facility]     Patient family notified on 03/07/15 of transfer.  Name of family member notified:  unable to reach son - pt aware     PHYSICIAN       Additional Comment:  58 day  pasarr. Facility aware. Ok to notify [facility] of placement at [facility] per pt.   _______________________________________________ [name], [place] 03/07/2015, 11:09 AM [phone]

## 2015-03-07 NOTE — Clinical Social Work Placement (Signed)
   CLINICAL SOCIAL WORK PLACEMENT  NOTE  Date:  03/07/2015  Patient Details  Name: Eric Scott MRN: [id number] Date of Birth: 07-01-1950  Clinical Social Work is seeking post-discharge placement for this patient at the [place] level of care (*CSW will initial, date and re-position this form in  chart as items are completed):  Yes   Patient/family provided with [place] Work Department's list of facilities offering this level of care within the geographic area requested by the patient (or if unable, by the patient's family).  Yes   Patient/family informed of their freedom to choose among providers that offer the needed level of care, that participate in Medicare, Medicaid or managed care program needed by the patient, have an available bed and are willing to accept the patient.  Yes   Patient/family informed of [facility]'s ownership interest in [facility] and [facility], as well as of the fact that they are under no obligation to receive care at these facilities.  PASRR submitted to EDS on 03/07/15     PASRR number received on 03/07/15     Existing PASRR number confirmed on       FL2 transmitted to all facilities in geographic area requested by pt/family on 03/07/15     FL2 transmitted to all facilities within larger geographic area on       Patient informed that his/her managed care company has contracts with or will negotiate with certain facilities, including the following:            Patient/family informed of bed offers received.  Patient chooses bed at       Physician recommends and patient chooses bed at      Patient to be transferred to   on  .  Patient to be transferred to facility by       Patient family notified on   of transfer.  Name of family member notified:        PHYSICIAN       Additional Comment:  70 day pasarr.   _______________________________________________ [name],  [place] 03/07/2015, 10:41 AM [phone]

## 2015-03-07 NOTE — Clinical Social Work Note (Signed)
Clinical Social Work Assessment  Patient Details  Name: Eric Scott MRN: [id number] Date of Birth: [birth year]  Date of referral:  03/07/15               Reason for consult:  Facility Placement                Permission sought to share information with:  Family Supports Permission granted to share information::  Yes, Verbal Permission Granted  Name::     Eric Scott.   Agency::     Relationship::  son  Contact Information:     Housing/Transportation Living arrangements for the past 2 months:  [place] of Information:  Patient Patient Interpreter Needed:  None Criminal Activity/Legal Involvement Pertinent to Current Situation/Hospitalization:  No - Comment as needed Significant Relationships:  Adult Children Lives with:  Facility Resident Do you feel safe going back to the place where you live?  Yes Need for family participation in patient care:  Yes (Comment)  Care giving concerns:  Pt [place] term resident at ALF, needs higher level of care.    Social Worker assessment / plan:  CSW met with pt at bedside. Pt alert and oriented and reports he has been a resident at [facility] for about 2 years. His son, Eric Scott. lives in [place] and visits regularly. Pt reports he has had a lot of leg pain and weakness recently and has also had a few falls. CSW spoke with [name] at [facility] who reports pt needs higher level of care due to current needs. She is willing to consider pt return if he is able to get back to baseline. CSW shared this with pt who states he was not aware they could not take him back. He was quiet for awhile and appeared overwhelmed, but would not discuss. He states he is willing to go to SNF as [facility] as it is in [place]. SNF list provided.  Pt is stable for d/c today per MD.  Employment status:    Insurance information:  Managed Medicare PT Recommendations:  [place] / Referral to community resources:  [place]  Patient/Family's Response to care:  Pt agreeable to SNF.   Patient/Family's Understanding of and Emotional Response to Diagnosis, Current Treatment, and Prognosis:  Pt aware of admission diagnosis and that he is ready for d/c per MD. He appeared to be somewhat overwhelmed by information, but did not want to discuss his feelings.   Emotional Assessment Appearance:  Appears stated age Attitude/Demeanor/Rapport:  Other (Cooperative) Affect (typically observed):  Other (appeared overwhelmed) Orientation:  Oriented to Self, Oriented to Place, Oriented to  Time, Oriented to Situation Alcohol / Substance use:  Not Applicable Psych involvement (Current and /or in the community):  No (Comment)  Discharge Needs  Concerns to be addressed:  Discharge Planning Concerns Readmission within the last 30 days:  No Current discharge risk:  Physical Impairment Barriers to Discharge:  No Barriers Identified   Eric Scott, [place] 03/07/2015, 10:44 AM [phone]

## 2015-03-07 NOTE — Discharge Summary (Signed)
Physician Discharge Summary  PAU ZELDIN [name] DOB: [birth year] DOA: 03/04/2015  PCP: [name], MD  Admit date: 03/04/2015 Discharge date: 03/07/2015  Recommendations for Outpatient Follow-up:   Consider follow-up with orthopedic surgeon depending on clinical course but expect spontaneous resolution of right knee pain.  Multiple falls prior to admission, plan skilled rehabilitation. On chronic narcotics and benzodiazepine's. He is on Xanax for coarse tremor so probably cannot stop this medication.    Follow-up Information    Follow up with Arther Abbott, MD.   Specialties:  Orthopedic Surgery, Radiology   Contact information:   [address] [address] [phone]       Follow up with Sanjuana Kava, MD.   Specialty:  Orthopedic Surgery   Contact information:   [place] [address] [phone]       Follow up with [name], MD.   Specialty:  Internal Medicine   Why:  after rehab   Contact information:   [place] [id number] [phone]      Discharge Diagnoses:  1. AKI. 2. Mutiple falls prior to admission.  3. Generalized weakness without new focal deficit. 4. Chronic coarse tremor 5. Chronic diastolic CHF 6. COPD. 7. DM type 2 with peripheral neuropathy. 8. Tobacco use disorder.   Discharge Condition: Improved  Disposition: Discharge to SNF.   Diet recommendation: Regular  Filed Weights   03/04/15 0356 03/04/15 1148  Weight: 136.079 kg (300 lb) 142.9 kg (315 lb 0.6 oz)    History of present illness:  23 yom with PMH of HTN, HLD, DM type 2, CVA, CHF, peripheral edema and COPD presented to the ED with SOB and generalized weakness. Patient also reported a fever at his assisted living and a nonproductive cough. While in the ED, his CXR and troponin were unremarkable. Labs revealed mildly elevated BUN/creatinine 24/1.50 but were otherwise stable. VSS. EDP planned  discharge however the patient was not able to stand secondary to generalized weakness and so was referred for observation. His assisted living facility advised that he could not return unless able to ambulate.  Hospital Course:  AKI suspected to be multifactorial including lisinopril, poor oral intake was resolved with IVF. Suspect the multiple falls that were prior to admission to be mechanical and possibly due to Xanax and narcotic use complicated by poor oral intake and dehydration;.Bilateral knee pain seems to be improving, right knee pain persists with joint effusion status post fall, no fracture immobility seems to be improving. PT has evaluated and recommended SNF upon discharge. Fever was reported at the facility but not noted as inpatient. Although there were chronic lower extremity edema noted, there were no pulmonary findings, in the setting of chronic diastolic CHF. Overall he appears improved. We will plan short-term rehabilitation as recommended by physical therapy.  Individual issues as below:  1. AKI, suspect multifactorial including Lasix, lisinopril, poor oral intake. Resolved. Adequate oral intake. 2. Mutiple falls prior to admission. Suspect mechanical, likely contributor is poor oral intake, but the patient cannot recall the details. Associated with right knee pain and effusion without apparent complication. Consider Xanax or narcotics as contributing factors. No focal findings. Per PT, plan SNF.  3. Reported fever at facility. No fever as inpatient, no leukocytosis. No evidence of infection. 4. Generalized weakness without focal deficit. 5. Chronic diastolic CHF, chronic lower extremity edema noted, no pulmonary findings  6. COPD, stable. 7. DM type 2 with peripheral neuropathy, remains stable.  8. Tobacco use disorder. In remission.   Consultants:  PT- SNF;Supervision/Assistance - 24 hour and fall precautions  Procedures:  None   Antibiotics:  None  Discharge  Instructions   Current Discharge Medication List    CONTINUE these medications which have CHANGED   Details  ALPRAZolam (XANAX) 1 MG tablet Take 1 tablet (1 mg total) by mouth 3 (three) times daily. Qty: 30 tablet, Refills: 0    oxyCODONE-acetaminophen (PERCOCET) 10-325 MG tablet Take 1 tablet by mouth every 8 (eight) hours as needed for pain (moderate). Qty: 30 tablet, Refills: 0      CONTINUE these medications which have NOT CHANGED   Details  albuterol (PROVENTIL) (2.5 MG/3ML) 0.083% nebulizer solution Take 2.5 mg by nebulization every 3 (three) hours as needed for wheezing or shortness of breath.    amLODipine (NORVASC) 10 MG tablet Take 10 mg by mouth daily.    aspirin EC 81 MG tablet Take 81 mg by mouth daily.    atorvastatin (LIPITOR) 20 MG tablet Take 20 mg by mouth daily.     calcium-vitamin D (OSCAL WITH D) 500-200 MG-UNIT per tablet Take 1 tablet by mouth 3 (three) times daily.    citalopram (CELEXA) 20 MG tablet Take 20 mg by mouth daily.    fish oil-omega-3 fatty acids 1000 MG capsule Take 1 g by mouth 2 (two) times daily.    Fluticasone-Salmeterol (ADVAIR) 250-50 MCG/DOSE AEPB Inhale 1 puff into the lungs every 12 (twelve) hours.    furosemide (LASIX) 20 MG tablet Take 40 mg by mouth daily.     gabapentin (NEURONTIN) 800 MG tablet Take 800 mg by mouth 3 (three) times daily.    glimepiride (AMARYL) 2 MG tablet Take 2 mg by mouth daily with breakfast.    ipratropium (ATROVENT) 0.02 % nebulizer solution Take 0.5 mg by nebulization every 6 (six) hours as needed for wheezing or shortness of breath.    lisinopril (PRINIVIL,ZESTRIL) 5 MG tablet Take 5 mg by mouth daily.    metFORMIN (GLUCOPHAGE) 1000 MG tablet Take 1,000 mg by mouth 2 (two) times daily with a meal.     polyethylene glycol (MIRALAX) packet Take 17 g by mouth daily. Qty: 14 each, Refills: 0    tiotropium (SPIRIVA) 18 MCG inhalation capsule Place 18 mcg into inhaler and inhale daily.      triamcinolone cream (KENALOG) 0.1 % Apply 1 application topically daily. Apply to affected area of bilateral lower legs once daily during dressing change.      STOP taking these medications     HYDROcodone-acetaminophen (NORCO) 10-325 MG per tablet        Allergies  Allergen Reactions  . Cucumber Extract Other (See Comments)    "Fells like I'm having a heart attack"  . Flexeril [Cyclobenzaprine Hcl] Other (See Comments)    "whole body  Tremors" (05/27/2012)  . Kiwi Extract Other (See Comments)    "feels like I'm having a heart attack"    The results of significant diagnostics from this hospitalization (including imaging, microbiology, ancillary and laboratory) are listed below for reference.    Significant Diagnostic Studies: Dg Chest Port 1 View  03/04/2015   CLINICAL DATA:  Shortness of breath  EXAM: PORTABLE CHEST 1 VIEW  COMPARISON:  07/21/2014  FINDINGS: Chronic cardiomegaly. Negative aortic and hilar contours. Pulmonary venous congestion without edema or effusion. Pneumonia noted 07/21/2014 has resolved. No consolidation, collapse, or air leak.  IMPRESSION: Chronic cardiomegaly with pulmonary venous congestion.   Electronically Signed  By: Monte Fantasia M.D.   On: 03/04/2015 05:01   Dg Knee Complete 4 Views Right  03/04/2015   CLINICAL DATA:  Fall with right knee pain and swelling. Initial encounter.  EXAM: RIGHT KNEE - COMPLETE 4+ VIEW  COMPARISON:  Leg x-ray 07/16/2013  FINDINGS: Subcutaneous fat reticulation about the leg is chronic based on the previous study. Knee joint effusion is present without visible fat component. No acute fracture or malalignment. Osteopenia.  IMPRESSION: Joint effusion without acute osseous finding.   Electronically Signed   By: Monte Fantasia M.D.   On: 03/04/2015 06:17    Microbiology: Recent Results (from the past 240 hour(s))  MRSA PCR Screening     Status: Abnormal   Collection Time: 03/04/15  9:00 PM  Result Value Ref Range Status    MRSA by PCR POSITIVE (A) NEGATIVE Final    Comment:        The GeneXpert MRSA Assay (FDA approved for NASAL specimens only), is one component of a comprehensive MRSA colonization surveillance program. It is not intended to diagnose MRSA infection nor to guide or monitor treatment for MRSA infections. RESULT CALLED TO, READ BACK BY AND VERIFIED WITH: MCKINNEY,S ON 03/04/15 AT 2300 BY LOY,C      Labs: Basic Metabolic Panel:  Recent Labs Lab 03/02/15 0950 03/04/15 0435 03/05/15 0744  NA 139 142 139  K 4.2 4.2 3.7  CL 101 104 105  CO2 30 32 27  GLUCOSE 172* 140* 149*  BUN 25* 24* 19  CREATININE 1.54* 1.50* 1.25*  CALCIUM 8.7* 9.8 8.3*   Liver Function Tests:  Recent Labs Lab 03/04/15 0435  AST 40  ALT 44  ALKPHOS 99  BILITOT 0.9  PROT 7.5  ALBUMIN 3.9   CBC:  Recent Labs Lab 03/02/15 0950 03/04/15 0435 03/05/15 0744  WBC 8.0 9.8 7.2  NEUTROABS 5.8 7.5  --   HGB 12.8* 12.9* 13.0  HCT 38.1* 38.5* 38.1*  MCV 96.2 96.0 95.0  PLT 193 199 185   Cardiac Enzymes:  Recent Labs Lab 03/04/15 0435  TROPONINI <0.03     Recent Labs  07/20/14 1537 03/02/15 0950 03/04/15 0435  BNP 147.0* 93.0 60.0    CBG:  Recent Labs Lab 03/06/15 1141 03/06/15 1639 03/06/15 2209 03/07/15 0803 03/07/15 1114  GLUCAP 180* 172* 163* 169* 168*    Principal Problem:   AKI (acute kidney injury) ([name]) Active Problems:   Chronic diastolic CHF (congestive heart failure) ([name])   Diabetic peripheral neuropathy ([name])   Edema   Fall   Generalized weakness   Signed:  Murray Hodgkins, MD [facility] 03/07/2015, 7:30 AM   Time spent: 35 minutes   By signing my name below, I, Rennis Harding attest that this documentation has been prepared under the direction and in the presence of Murray Hodgkins, MD Electronically signed: Rennis Harding  03/07/2015 11:46 AM.  I personally performed the services described in this documentation. All medical record entries  made by the scribe were at my direction. I have reviewed the chart and agree that the record reflects my personal performance and is accurate and complete. Murray Hodgkins, MD

## 2015-06-06 DIAGNOSIS — M6281 Muscle weakness (generalized): Secondary | ICD-10-CM | POA: Diagnosis not present

## 2015-06-06 DIAGNOSIS — N179 Acute kidney failure, unspecified: Secondary | ICD-10-CM | POA: Diagnosis not present

## 2015-06-07 DIAGNOSIS — M6281 Muscle weakness (generalized): Secondary | ICD-10-CM | POA: Diagnosis not present

## 2015-06-07 DIAGNOSIS — N179 Acute kidney failure, unspecified: Secondary | ICD-10-CM | POA: Diagnosis not present

## 2015-06-08 DIAGNOSIS — M6281 Muscle weakness (generalized): Secondary | ICD-10-CM | POA: Diagnosis not present

## 2015-06-08 DIAGNOSIS — N179 Acute kidney failure, unspecified: Secondary | ICD-10-CM | POA: Diagnosis not present

## 2015-06-09 DIAGNOSIS — N179 Acute kidney failure, unspecified: Secondary | ICD-10-CM | POA: Diagnosis not present

## 2015-06-09 DIAGNOSIS — M6281 Muscle weakness (generalized): Secondary | ICD-10-CM | POA: Diagnosis not present

## 2015-06-10 DIAGNOSIS — N179 Acute kidney failure, unspecified: Secondary | ICD-10-CM | POA: Diagnosis not present

## 2015-06-10 DIAGNOSIS — M6281 Muscle weakness (generalized): Secondary | ICD-10-CM | POA: Diagnosis not present

## 2015-06-13 DIAGNOSIS — N179 Acute kidney failure, unspecified: Secondary | ICD-10-CM | POA: Diagnosis not present

## 2015-06-13 DIAGNOSIS — M6281 Muscle weakness (generalized): Secondary | ICD-10-CM | POA: Diagnosis not present

## 2015-06-14 DIAGNOSIS — N179 Acute kidney failure, unspecified: Secondary | ICD-10-CM | POA: Diagnosis not present

## 2015-06-14 DIAGNOSIS — M6281 Muscle weakness (generalized): Secondary | ICD-10-CM | POA: Diagnosis not present

## 2015-07-01 DIAGNOSIS — E119 Type 2 diabetes mellitus without complications: Secondary | ICD-10-CM | POA: Diagnosis not present

## 2015-07-05 DIAGNOSIS — J449 Chronic obstructive pulmonary disease, unspecified: Secondary | ICD-10-CM | POA: Diagnosis not present

## 2015-08-04 DIAGNOSIS — M6281 Muscle weakness (generalized): Secondary | ICD-10-CM | POA: Diagnosis not present

## 2015-08-04 DIAGNOSIS — N179 Acute kidney failure, unspecified: Secondary | ICD-10-CM | POA: Diagnosis not present

## 2015-08-05 DIAGNOSIS — N179 Acute kidney failure, unspecified: Secondary | ICD-10-CM | POA: Diagnosis not present

## 2015-08-05 DIAGNOSIS — M6281 Muscle weakness (generalized): Secondary | ICD-10-CM | POA: Diagnosis not present

## 2015-08-08 DIAGNOSIS — N179 Acute kidney failure, unspecified: Secondary | ICD-10-CM | POA: Diagnosis not present

## 2015-08-08 DIAGNOSIS — M6281 Muscle weakness (generalized): Secondary | ICD-10-CM | POA: Diagnosis not present

## 2015-08-09 DIAGNOSIS — M6281 Muscle weakness (generalized): Secondary | ICD-10-CM | POA: Diagnosis not present

## 2015-08-09 DIAGNOSIS — N179 Acute kidney failure, unspecified: Secondary | ICD-10-CM | POA: Diagnosis not present

## 2015-08-11 DIAGNOSIS — N179 Acute kidney failure, unspecified: Secondary | ICD-10-CM | POA: Diagnosis not present

## 2015-08-11 DIAGNOSIS — M6281 Muscle weakness (generalized): Secondary | ICD-10-CM | POA: Diagnosis not present

## 2015-08-12 DIAGNOSIS — M6281 Muscle weakness (generalized): Secondary | ICD-10-CM | POA: Diagnosis not present

## 2015-08-12 DIAGNOSIS — N179 Acute kidney failure, unspecified: Secondary | ICD-10-CM | POA: Diagnosis not present

## 2015-08-13 DIAGNOSIS — M6281 Muscle weakness (generalized): Secondary | ICD-10-CM | POA: Diagnosis not present

## 2015-08-13 DIAGNOSIS — N179 Acute kidney failure, unspecified: Secondary | ICD-10-CM | POA: Diagnosis not present

## 2015-08-15 DIAGNOSIS — N179 Acute kidney failure, unspecified: Secondary | ICD-10-CM | POA: Diagnosis not present

## 2015-08-15 DIAGNOSIS — M6281 Muscle weakness (generalized): Secondary | ICD-10-CM | POA: Diagnosis not present

## 2015-08-16 DIAGNOSIS — M6281 Muscle weakness (generalized): Secondary | ICD-10-CM | POA: Diagnosis not present

## 2015-08-16 DIAGNOSIS — N179 Acute kidney failure, unspecified: Secondary | ICD-10-CM | POA: Diagnosis not present

## 2015-08-17 DIAGNOSIS — M6281 Muscle weakness (generalized): Secondary | ICD-10-CM | POA: Diagnosis not present

## 2015-08-17 DIAGNOSIS — N179 Acute kidney failure, unspecified: Secondary | ICD-10-CM | POA: Diagnosis not present

## 2015-08-18 DIAGNOSIS — N179 Acute kidney failure, unspecified: Secondary | ICD-10-CM | POA: Diagnosis not present

## 2015-08-18 DIAGNOSIS — M6281 Muscle weakness (generalized): Secondary | ICD-10-CM | POA: Diagnosis not present

## 2015-08-19 DIAGNOSIS — N179 Acute kidney failure, unspecified: Secondary | ICD-10-CM | POA: Diagnosis not present

## 2015-08-19 DIAGNOSIS — M6281 Muscle weakness (generalized): Secondary | ICD-10-CM | POA: Diagnosis not present

## 2015-08-22 DIAGNOSIS — M6281 Muscle weakness (generalized): Secondary | ICD-10-CM | POA: Diagnosis not present

## 2015-08-22 DIAGNOSIS — N179 Acute kidney failure, unspecified: Secondary | ICD-10-CM | POA: Diagnosis not present

## 2015-08-23 DIAGNOSIS — N179 Acute kidney failure, unspecified: Secondary | ICD-10-CM | POA: Diagnosis not present

## 2015-08-23 DIAGNOSIS — M6281 Muscle weakness (generalized): Secondary | ICD-10-CM | POA: Diagnosis not present

## 2015-08-24 DIAGNOSIS — N179 Acute kidney failure, unspecified: Secondary | ICD-10-CM | POA: Diagnosis not present

## 2015-08-24 DIAGNOSIS — M6281 Muscle weakness (generalized): Secondary | ICD-10-CM | POA: Diagnosis not present

## 2015-08-25 DIAGNOSIS — M6281 Muscle weakness (generalized): Secondary | ICD-10-CM | POA: Diagnosis not present

## 2015-08-25 DIAGNOSIS — N179 Acute kidney failure, unspecified: Secondary | ICD-10-CM | POA: Diagnosis not present

## 2015-08-26 DIAGNOSIS — M6281 Muscle weakness (generalized): Secondary | ICD-10-CM | POA: Diagnosis not present

## 2015-08-26 DIAGNOSIS — N179 Acute kidney failure, unspecified: Secondary | ICD-10-CM | POA: Diagnosis not present

## 2015-08-29 DIAGNOSIS — M6281 Muscle weakness (generalized): Secondary | ICD-10-CM | POA: Diagnosis not present

## 2015-08-29 DIAGNOSIS — N179 Acute kidney failure, unspecified: Secondary | ICD-10-CM | POA: Diagnosis not present

## 2015-08-30 DIAGNOSIS — M6281 Muscle weakness (generalized): Secondary | ICD-10-CM | POA: Diagnosis not present

## 2015-08-30 DIAGNOSIS — N179 Acute kidney failure, unspecified: Secondary | ICD-10-CM | POA: Diagnosis not present

## 2015-08-31 DIAGNOSIS — I509 Heart failure, unspecified: Secondary | ICD-10-CM | POA: Diagnosis not present

## 2015-08-31 DIAGNOSIS — E119 Type 2 diabetes mellitus without complications: Secondary | ICD-10-CM | POA: Diagnosis not present

## 2015-08-31 DIAGNOSIS — N179 Acute kidney failure, unspecified: Secondary | ICD-10-CM | POA: Diagnosis not present

## 2015-08-31 DIAGNOSIS — M6281 Muscle weakness (generalized): Secondary | ICD-10-CM | POA: Diagnosis not present

## 2015-09-01 DIAGNOSIS — N179 Acute kidney failure, unspecified: Secondary | ICD-10-CM | POA: Diagnosis not present

## 2015-09-01 DIAGNOSIS — M6281 Muscle weakness (generalized): Secondary | ICD-10-CM | POA: Diagnosis not present

## 2015-09-02 DIAGNOSIS — N179 Acute kidney failure, unspecified: Secondary | ICD-10-CM | POA: Diagnosis not present

## 2015-09-02 DIAGNOSIS — M6281 Muscle weakness (generalized): Secondary | ICD-10-CM | POA: Diagnosis not present

## 2015-09-05 DIAGNOSIS — M6281 Muscle weakness (generalized): Secondary | ICD-10-CM | POA: Diagnosis not present

## 2015-09-05 DIAGNOSIS — N179 Acute kidney failure, unspecified: Secondary | ICD-10-CM | POA: Diagnosis not present

## 2015-09-06 DIAGNOSIS — M6281 Muscle weakness (generalized): Secondary | ICD-10-CM | POA: Diagnosis not present

## 2015-09-06 DIAGNOSIS — N179 Acute kidney failure, unspecified: Secondary | ICD-10-CM | POA: Diagnosis not present

## 2015-09-07 DIAGNOSIS — N179 Acute kidney failure, unspecified: Secondary | ICD-10-CM | POA: Diagnosis not present

## 2015-09-07 DIAGNOSIS — M6281 Muscle weakness (generalized): Secondary | ICD-10-CM | POA: Diagnosis not present

## 2015-09-08 DIAGNOSIS — N179 Acute kidney failure, unspecified: Secondary | ICD-10-CM | POA: Diagnosis not present

## 2015-09-08 DIAGNOSIS — M6281 Muscle weakness (generalized): Secondary | ICD-10-CM | POA: Diagnosis not present

## 2015-09-09 DIAGNOSIS — N179 Acute kidney failure, unspecified: Secondary | ICD-10-CM | POA: Diagnosis not present

## 2015-09-09 DIAGNOSIS — M6281 Muscle weakness (generalized): Secondary | ICD-10-CM | POA: Diagnosis not present

## 2015-09-10 DIAGNOSIS — A4901 Methicillin susceptible Staphylococcus aureus infection, unspecified site: Secondary | ICD-10-CM | POA: Diagnosis not present

## 2015-09-12 DIAGNOSIS — E1169 Type 2 diabetes mellitus with other specified complication: Secondary | ICD-10-CM | POA: Diagnosis not present

## 2015-09-12 DIAGNOSIS — N179 Acute kidney failure, unspecified: Secondary | ICD-10-CM | POA: Diagnosis not present

## 2015-09-12 DIAGNOSIS — M6281 Muscle weakness (generalized): Secondary | ICD-10-CM | POA: Diagnosis not present

## 2015-09-12 DIAGNOSIS — I5032 Chronic diastolic (congestive) heart failure: Secondary | ICD-10-CM | POA: Diagnosis not present

## 2015-09-12 DIAGNOSIS — I10 Essential (primary) hypertension: Secondary | ICD-10-CM | POA: Diagnosis not present

## 2015-09-13 DIAGNOSIS — N179 Acute kidney failure, unspecified: Secondary | ICD-10-CM | POA: Diagnosis not present

## 2015-09-13 DIAGNOSIS — M6281 Muscle weakness (generalized): Secondary | ICD-10-CM | POA: Diagnosis not present

## 2015-09-14 DIAGNOSIS — N179 Acute kidney failure, unspecified: Secondary | ICD-10-CM | POA: Diagnosis not present

## 2015-09-14 DIAGNOSIS — M6281 Muscle weakness (generalized): Secondary | ICD-10-CM | POA: Diagnosis not present

## 2015-09-15 DIAGNOSIS — M6281 Muscle weakness (generalized): Secondary | ICD-10-CM | POA: Diagnosis not present

## 2015-09-15 DIAGNOSIS — N179 Acute kidney failure, unspecified: Secondary | ICD-10-CM | POA: Diagnosis not present

## 2015-09-16 DIAGNOSIS — N179 Acute kidney failure, unspecified: Secondary | ICD-10-CM | POA: Diagnosis not present

## 2015-09-16 DIAGNOSIS — M6281 Muscle weakness (generalized): Secondary | ICD-10-CM | POA: Diagnosis not present

## 2015-09-19 DIAGNOSIS — N179 Acute kidney failure, unspecified: Secondary | ICD-10-CM | POA: Diagnosis not present

## 2015-09-19 DIAGNOSIS — M6281 Muscle weakness (generalized): Secondary | ICD-10-CM | POA: Diagnosis not present

## 2015-09-20 DIAGNOSIS — N179 Acute kidney failure, unspecified: Secondary | ICD-10-CM | POA: Diagnosis not present

## 2015-09-20 DIAGNOSIS — M6281 Muscle weakness (generalized): Secondary | ICD-10-CM | POA: Diagnosis not present

## 2015-09-21 DIAGNOSIS — N179 Acute kidney failure, unspecified: Secondary | ICD-10-CM | POA: Diagnosis not present

## 2015-09-21 DIAGNOSIS — M6281 Muscle weakness (generalized): Secondary | ICD-10-CM | POA: Diagnosis not present

## 2015-09-22 DIAGNOSIS — N179 Acute kidney failure, unspecified: Secondary | ICD-10-CM | POA: Diagnosis not present

## 2015-09-22 DIAGNOSIS — M6281 Muscle weakness (generalized): Secondary | ICD-10-CM | POA: Diagnosis not present

## 2015-09-23 DIAGNOSIS — M6281 Muscle weakness (generalized): Secondary | ICD-10-CM | POA: Diagnosis not present

## 2015-09-23 DIAGNOSIS — N179 Acute kidney failure, unspecified: Secondary | ICD-10-CM | POA: Diagnosis not present

## 2015-09-26 DIAGNOSIS — N179 Acute kidney failure, unspecified: Secondary | ICD-10-CM | POA: Diagnosis not present

## 2015-09-26 DIAGNOSIS — M6281 Muscle weakness (generalized): Secondary | ICD-10-CM | POA: Diagnosis not present

## 2015-09-27 DIAGNOSIS — N179 Acute kidney failure, unspecified: Secondary | ICD-10-CM | POA: Diagnosis not present

## 2015-09-27 DIAGNOSIS — M6281 Muscle weakness (generalized): Secondary | ICD-10-CM | POA: Diagnosis not present

## 2015-09-28 DIAGNOSIS — M6281 Muscle weakness (generalized): Secondary | ICD-10-CM | POA: Diagnosis not present

## 2015-09-28 DIAGNOSIS — N179 Acute kidney failure, unspecified: Secondary | ICD-10-CM | POA: Diagnosis not present

## 2015-09-29 DIAGNOSIS — N179 Acute kidney failure, unspecified: Secondary | ICD-10-CM | POA: Diagnosis not present

## 2015-09-29 DIAGNOSIS — M6281 Muscle weakness (generalized): Secondary | ICD-10-CM | POA: Diagnosis not present

## 2015-09-30 DIAGNOSIS — N179 Acute kidney failure, unspecified: Secondary | ICD-10-CM | POA: Diagnosis not present

## 2015-09-30 DIAGNOSIS — I509 Heart failure, unspecified: Secondary | ICD-10-CM | POA: Diagnosis not present

## 2015-09-30 DIAGNOSIS — M6281 Muscle weakness (generalized): Secondary | ICD-10-CM | POA: Diagnosis not present

## 2015-09-30 DIAGNOSIS — E119 Type 2 diabetes mellitus without complications: Secondary | ICD-10-CM | POA: Diagnosis not present

## 2015-10-01 DIAGNOSIS — J9600 Acute respiratory failure, unspecified whether with hypoxia or hypercapnia: Secondary | ICD-10-CM | POA: Diagnosis not present

## 2015-10-01 DIAGNOSIS — I10 Essential (primary) hypertension: Secondary | ICD-10-CM | POA: Diagnosis not present

## 2015-10-01 DIAGNOSIS — E119 Type 2 diabetes mellitus without complications: Secondary | ICD-10-CM | POA: Diagnosis not present

## 2015-10-01 DIAGNOSIS — R918 Other nonspecific abnormal finding of lung field: Secondary | ICD-10-CM | POA: Diagnosis not present

## 2015-10-01 DIAGNOSIS — N179 Acute kidney failure, unspecified: Secondary | ICD-10-CM | POA: Diagnosis not present

## 2015-10-03 DIAGNOSIS — M6281 Muscle weakness (generalized): Secondary | ICD-10-CM | POA: Diagnosis not present

## 2015-10-03 DIAGNOSIS — N179 Acute kidney failure, unspecified: Secondary | ICD-10-CM | POA: Diagnosis not present

## 2015-10-04 DIAGNOSIS — N179 Acute kidney failure, unspecified: Secondary | ICD-10-CM | POA: Diagnosis not present

## 2015-10-04 DIAGNOSIS — M6281 Muscle weakness (generalized): Secondary | ICD-10-CM | POA: Diagnosis not present

## 2015-10-05 DIAGNOSIS — N179 Acute kidney failure, unspecified: Secondary | ICD-10-CM | POA: Diagnosis not present

## 2015-10-05 DIAGNOSIS — M6281 Muscle weakness (generalized): Secondary | ICD-10-CM | POA: Diagnosis not present

## 2015-10-06 DIAGNOSIS — N179 Acute kidney failure, unspecified: Secondary | ICD-10-CM | POA: Diagnosis not present

## 2015-10-06 DIAGNOSIS — M6281 Muscle weakness (generalized): Secondary | ICD-10-CM | POA: Diagnosis not present

## 2015-10-07 DIAGNOSIS — N179 Acute kidney failure, unspecified: Secondary | ICD-10-CM | POA: Diagnosis not present

## 2015-10-07 DIAGNOSIS — M6281 Muscle weakness (generalized): Secondary | ICD-10-CM | POA: Diagnosis not present

## 2015-10-10 DIAGNOSIS — N179 Acute kidney failure, unspecified: Secondary | ICD-10-CM | POA: Diagnosis not present

## 2015-10-10 DIAGNOSIS — M6281 Muscle weakness (generalized): Secondary | ICD-10-CM | POA: Diagnosis not present

## 2015-10-11 DIAGNOSIS — N179 Acute kidney failure, unspecified: Secondary | ICD-10-CM | POA: Diagnosis not present

## 2015-10-11 DIAGNOSIS — M6281 Muscle weakness (generalized): Secondary | ICD-10-CM | POA: Diagnosis not present

## 2015-10-12 DIAGNOSIS — N179 Acute kidney failure, unspecified: Secondary | ICD-10-CM | POA: Diagnosis not present

## 2015-10-12 DIAGNOSIS — M6281 Muscle weakness (generalized): Secondary | ICD-10-CM | POA: Diagnosis not present

## 2015-10-13 DIAGNOSIS — M6281 Muscle weakness (generalized): Secondary | ICD-10-CM | POA: Diagnosis not present

## 2015-10-13 DIAGNOSIS — N179 Acute kidney failure, unspecified: Secondary | ICD-10-CM | POA: Diagnosis not present

## 2015-10-14 DIAGNOSIS — N179 Acute kidney failure, unspecified: Secondary | ICD-10-CM | POA: Diagnosis not present

## 2015-10-14 DIAGNOSIS — M6281 Muscle weakness (generalized): Secondary | ICD-10-CM | POA: Diagnosis not present

## 2015-10-17 DIAGNOSIS — M6281 Muscle weakness (generalized): Secondary | ICD-10-CM | POA: Diagnosis not present

## 2015-10-17 DIAGNOSIS — N179 Acute kidney failure, unspecified: Secondary | ICD-10-CM | POA: Diagnosis not present

## 2015-10-18 DIAGNOSIS — M6281 Muscle weakness (generalized): Secondary | ICD-10-CM | POA: Diagnosis not present

## 2015-10-18 DIAGNOSIS — N179 Acute kidney failure, unspecified: Secondary | ICD-10-CM | POA: Diagnosis not present

## 2015-10-19 DIAGNOSIS — M6281 Muscle weakness (generalized): Secondary | ICD-10-CM | POA: Diagnosis not present

## 2015-10-19 DIAGNOSIS — N179 Acute kidney failure, unspecified: Secondary | ICD-10-CM | POA: Diagnosis not present

## 2015-10-20 DIAGNOSIS — M6281 Muscle weakness (generalized): Secondary | ICD-10-CM | POA: Diagnosis not present

## 2015-10-20 DIAGNOSIS — E1169 Type 2 diabetes mellitus with other specified complication: Secondary | ICD-10-CM | POA: Diagnosis not present

## 2015-10-20 DIAGNOSIS — R600 Localized edema: Secondary | ICD-10-CM | POA: Diagnosis not present

## 2015-10-20 DIAGNOSIS — N179 Acute kidney failure, unspecified: Secondary | ICD-10-CM | POA: Diagnosis not present

## 2015-10-20 DIAGNOSIS — M79604 Pain in right leg: Secondary | ICD-10-CM | POA: Diagnosis not present

## 2015-10-20 DIAGNOSIS — L0390 Cellulitis, unspecified: Secondary | ICD-10-CM | POA: Diagnosis not present

## 2015-10-21 DIAGNOSIS — N179 Acute kidney failure, unspecified: Secondary | ICD-10-CM | POA: Diagnosis not present

## 2015-10-21 DIAGNOSIS — A4901 Methicillin susceptible Staphylococcus aureus infection, unspecified site: Secondary | ICD-10-CM | POA: Diagnosis not present

## 2015-10-21 DIAGNOSIS — Z79899 Other long term (current) drug therapy: Secondary | ICD-10-CM | POA: Diagnosis not present

## 2015-10-21 DIAGNOSIS — M6281 Muscle weakness (generalized): Secondary | ICD-10-CM | POA: Diagnosis not present

## 2015-10-24 DIAGNOSIS — M6281 Muscle weakness (generalized): Secondary | ICD-10-CM | POA: Diagnosis not present

## 2015-10-24 DIAGNOSIS — N179 Acute kidney failure, unspecified: Secondary | ICD-10-CM | POA: Diagnosis not present

## 2015-10-25 DIAGNOSIS — M6281 Muscle weakness (generalized): Secondary | ICD-10-CM | POA: Diagnosis not present

## 2015-10-25 DIAGNOSIS — N179 Acute kidney failure, unspecified: Secondary | ICD-10-CM | POA: Diagnosis not present

## 2015-10-26 DIAGNOSIS — N179 Acute kidney failure, unspecified: Secondary | ICD-10-CM | POA: Diagnosis not present

## 2015-10-26 DIAGNOSIS — M6281 Muscle weakness (generalized): Secondary | ICD-10-CM | POA: Diagnosis not present

## 2015-10-27 DIAGNOSIS — M6281 Muscle weakness (generalized): Secondary | ICD-10-CM | POA: Diagnosis not present

## 2015-10-27 DIAGNOSIS — M79604 Pain in right leg: Secondary | ICD-10-CM | POA: Diagnosis not present

## 2015-10-27 DIAGNOSIS — L0390 Cellulitis, unspecified: Secondary | ICD-10-CM | POA: Diagnosis not present

## 2015-10-27 DIAGNOSIS — R600 Localized edema: Secondary | ICD-10-CM | POA: Diagnosis not present

## 2015-10-27 DIAGNOSIS — E1140 Type 2 diabetes mellitus with diabetic neuropathy, unspecified: Secondary | ICD-10-CM | POA: Diagnosis not present

## 2015-10-27 DIAGNOSIS — N179 Acute kidney failure, unspecified: Secondary | ICD-10-CM | POA: Diagnosis not present

## 2015-10-28 DIAGNOSIS — N179 Acute kidney failure, unspecified: Secondary | ICD-10-CM | POA: Diagnosis not present

## 2015-10-28 DIAGNOSIS — M6281 Muscle weakness (generalized): Secondary | ICD-10-CM | POA: Diagnosis not present

## 2015-10-31 DIAGNOSIS — M6281 Muscle weakness (generalized): Secondary | ICD-10-CM | POA: Diagnosis not present

## 2015-10-31 DIAGNOSIS — N179 Acute kidney failure, unspecified: Secondary | ICD-10-CM | POA: Diagnosis not present

## 2015-11-01 DIAGNOSIS — N179 Acute kidney failure, unspecified: Secondary | ICD-10-CM | POA: Diagnosis not present

## 2015-11-01 DIAGNOSIS — M6281 Muscle weakness (generalized): Secondary | ICD-10-CM | POA: Diagnosis not present

## 2015-11-02 DIAGNOSIS — N179 Acute kidney failure, unspecified: Secondary | ICD-10-CM | POA: Diagnosis not present

## 2015-11-02 DIAGNOSIS — M6281 Muscle weakness (generalized): Secondary | ICD-10-CM | POA: Diagnosis not present

## 2015-11-03 DIAGNOSIS — M6281 Muscle weakness (generalized): Secondary | ICD-10-CM | POA: Diagnosis not present

## 2015-11-03 DIAGNOSIS — N179 Acute kidney failure, unspecified: Secondary | ICD-10-CM | POA: Diagnosis not present

## 2015-11-04 DIAGNOSIS — M6281 Muscle weakness (generalized): Secondary | ICD-10-CM | POA: Diagnosis not present

## 2015-11-04 DIAGNOSIS — N179 Acute kidney failure, unspecified: Secondary | ICD-10-CM | POA: Diagnosis not present

## 2015-11-07 DIAGNOSIS — N179 Acute kidney failure, unspecified: Secondary | ICD-10-CM | POA: Diagnosis not present

## 2015-11-07 DIAGNOSIS — M6281 Muscle weakness (generalized): Secondary | ICD-10-CM | POA: Diagnosis not present

## 2015-11-08 DIAGNOSIS — N179 Acute kidney failure, unspecified: Secondary | ICD-10-CM | POA: Diagnosis not present

## 2015-11-08 DIAGNOSIS — M6281 Muscle weakness (generalized): Secondary | ICD-10-CM | POA: Diagnosis not present

## 2015-11-09 DIAGNOSIS — M6281 Muscle weakness (generalized): Secondary | ICD-10-CM | POA: Diagnosis not present

## 2015-11-09 DIAGNOSIS — N179 Acute kidney failure, unspecified: Secondary | ICD-10-CM | POA: Diagnosis not present

## 2015-11-10 DIAGNOSIS — M6281 Muscle weakness (generalized): Secondary | ICD-10-CM | POA: Diagnosis not present

## 2015-11-10 DIAGNOSIS — N179 Acute kidney failure, unspecified: Secondary | ICD-10-CM | POA: Diagnosis not present

## 2015-11-11 DIAGNOSIS — N179 Acute kidney failure, unspecified: Secondary | ICD-10-CM | POA: Diagnosis not present

## 2015-11-11 DIAGNOSIS — M6281 Muscle weakness (generalized): Secondary | ICD-10-CM | POA: Diagnosis not present

## 2015-11-14 DIAGNOSIS — E1169 Type 2 diabetes mellitus with other specified complication: Secondary | ICD-10-CM | POA: Diagnosis not present

## 2015-11-14 DIAGNOSIS — R739 Hyperglycemia, unspecified: Secondary | ICD-10-CM | POA: Diagnosis not present

## 2015-11-14 DIAGNOSIS — M6281 Muscle weakness (generalized): Secondary | ICD-10-CM | POA: Diagnosis not present

## 2015-11-14 DIAGNOSIS — N179 Acute kidney failure, unspecified: Secondary | ICD-10-CM | POA: Diagnosis not present

## 2015-11-14 DIAGNOSIS — M79604 Pain in right leg: Secondary | ICD-10-CM | POA: Diagnosis not present

## 2015-11-14 DIAGNOSIS — R600 Localized edema: Secondary | ICD-10-CM | POA: Diagnosis not present

## 2015-11-15 DIAGNOSIS — M6281 Muscle weakness (generalized): Secondary | ICD-10-CM | POA: Diagnosis not present

## 2015-11-15 DIAGNOSIS — N179 Acute kidney failure, unspecified: Secondary | ICD-10-CM | POA: Diagnosis not present

## 2015-11-16 DIAGNOSIS — M6281 Muscle weakness (generalized): Secondary | ICD-10-CM | POA: Diagnosis not present

## 2015-11-16 DIAGNOSIS — N179 Acute kidney failure, unspecified: Secondary | ICD-10-CM | POA: Diagnosis not present

## 2015-11-17 DIAGNOSIS — N179 Acute kidney failure, unspecified: Secondary | ICD-10-CM | POA: Diagnosis not present

## 2015-11-17 DIAGNOSIS — M6281 Muscle weakness (generalized): Secondary | ICD-10-CM | POA: Diagnosis not present

## 2015-11-18 DIAGNOSIS — M6281 Muscle weakness (generalized): Secondary | ICD-10-CM | POA: Diagnosis not present

## 2015-11-18 DIAGNOSIS — N179 Acute kidney failure, unspecified: Secondary | ICD-10-CM | POA: Diagnosis not present

## 2015-11-21 DIAGNOSIS — N179 Acute kidney failure, unspecified: Secondary | ICD-10-CM | POA: Diagnosis not present

## 2015-11-21 DIAGNOSIS — M6281 Muscle weakness (generalized): Secondary | ICD-10-CM | POA: Diagnosis not present

## 2015-11-22 DIAGNOSIS — N179 Acute kidney failure, unspecified: Secondary | ICD-10-CM | POA: Diagnosis not present

## 2015-11-22 DIAGNOSIS — M6281 Muscle weakness (generalized): Secondary | ICD-10-CM | POA: Diagnosis not present

## 2015-11-23 DIAGNOSIS — M6281 Muscle weakness (generalized): Secondary | ICD-10-CM | POA: Diagnosis not present

## 2015-11-23 DIAGNOSIS — N179 Acute kidney failure, unspecified: Secondary | ICD-10-CM | POA: Diagnosis not present

## 2015-11-24 DIAGNOSIS — N179 Acute kidney failure, unspecified: Secondary | ICD-10-CM | POA: Diagnosis not present

## 2015-11-24 DIAGNOSIS — M6281 Muscle weakness (generalized): Secondary | ICD-10-CM | POA: Diagnosis not present

## 2015-11-25 DIAGNOSIS — M6281 Muscle weakness (generalized): Secondary | ICD-10-CM | POA: Diagnosis not present

## 2015-11-25 DIAGNOSIS — N179 Acute kidney failure, unspecified: Secondary | ICD-10-CM | POA: Diagnosis not present

## 2015-11-28 DIAGNOSIS — M6281 Muscle weakness (generalized): Secondary | ICD-10-CM | POA: Diagnosis not present

## 2015-11-28 DIAGNOSIS — N179 Acute kidney failure, unspecified: Secondary | ICD-10-CM | POA: Diagnosis not present

## 2015-11-29 DIAGNOSIS — N179 Acute kidney failure, unspecified: Secondary | ICD-10-CM | POA: Diagnosis not present

## 2015-11-29 DIAGNOSIS — M6281 Muscle weakness (generalized): Secondary | ICD-10-CM | POA: Diagnosis not present

## 2015-11-30 DIAGNOSIS — M6281 Muscle weakness (generalized): Secondary | ICD-10-CM | POA: Diagnosis not present

## 2015-11-30 DIAGNOSIS — N179 Acute kidney failure, unspecified: Secondary | ICD-10-CM | POA: Diagnosis not present

## 2015-12-01 DIAGNOSIS — N179 Acute kidney failure, unspecified: Secondary | ICD-10-CM | POA: Diagnosis not present

## 2015-12-01 DIAGNOSIS — M6281 Muscle weakness (generalized): Secondary | ICD-10-CM | POA: Diagnosis not present

## 2015-12-02 DIAGNOSIS — M6281 Muscle weakness (generalized): Secondary | ICD-10-CM | POA: Diagnosis not present

## 2015-12-02 DIAGNOSIS — N179 Acute kidney failure, unspecified: Secondary | ICD-10-CM | POA: Diagnosis not present

## 2016-05-29 ENCOUNTER — Emergency Department (HOSPITAL_COMMUNITY): Payer: Medicare Other

## 2016-05-29 ENCOUNTER — Encounter (HOSPITAL_COMMUNITY): Payer: Self-pay | Admitting: *Deleted

## 2016-05-29 ENCOUNTER — Emergency Department (HOSPITAL_COMMUNITY)
Admission: EM | Admit: 2016-05-29 | Discharge: 2016-05-29 | Disposition: A | Discharge status: Home / Self Care | Payer: Medicare Other | Attending: Emergency Medicine | Admitting: Emergency Medicine

## 2016-05-29 DIAGNOSIS — I5032 Chronic diastolic (congestive) heart failure: Secondary | ICD-10-CM | POA: Diagnosis not present

## 2016-05-29 DIAGNOSIS — S0093XA Contusion of unspecified part of head, initial encounter: Secondary | ICD-10-CM | POA: Insufficient documentation

## 2016-05-29 DIAGNOSIS — Y939 Activity, unspecified: Secondary | ICD-10-CM | POA: Insufficient documentation

## 2016-05-29 DIAGNOSIS — S0990XA Unspecified injury of head, initial encounter: Secondary | ICD-10-CM | POA: Diagnosis present

## 2016-05-29 DIAGNOSIS — H2102 Hyphema, left eye: Secondary | ICD-10-CM | POA: Diagnosis not present

## 2016-05-29 DIAGNOSIS — I110 Hypertensive heart disease with heart failure: Secondary | ICD-10-CM | POA: Insufficient documentation

## 2016-05-29 DIAGNOSIS — Y929 Unspecified place or not applicable: Secondary | ICD-10-CM | POA: Diagnosis not present

## 2016-05-29 DIAGNOSIS — Y999 Unspecified external cause status: Secondary | ICD-10-CM | POA: Insufficient documentation

## 2016-05-29 DIAGNOSIS — W050XXA Fall from non-moving wheelchair, initial encounter: Secondary | ICD-10-CM | POA: Diagnosis not present

## 2016-05-29 DIAGNOSIS — J441 Chronic obstructive pulmonary disease with (acute) exacerbation: Secondary | ICD-10-CM | POA: Diagnosis not present

## 2016-05-29 DIAGNOSIS — E119 Type 2 diabetes mellitus without complications: Secondary | ICD-10-CM | POA: Diagnosis not present

## 2016-05-29 MED ORDER — FLUORESCEIN SODIUM 0.6 MG OP STRP
1.0000 | ORAL_STRIP | Freq: Once | OPHTHALMIC | Status: DC
  Filled 2016-05-29: qty 1

## 2016-05-29 MED ORDER — PREDNISOLONE ACETATE 1 % OP SUSP
1.0000 [drp] | Freq: Four times a day (QID) | OPHTHALMIC | Status: DC
  Administered 2016-05-29: 1 [drp] via OPHTHALMIC
  Filled 2016-05-29: qty 1

## 2016-05-29 MED ORDER — PREDNISOLONE ACETATE 1 % OP SUSP
OPHTHALMIC | Status: AC
  Filled 2016-05-29: qty 5

## 2016-05-29 MED ORDER — ACETAMINOPHEN 500 MG PO TABS
1000.0000 mg | ORAL_TABLET | Freq: Once | ORAL | Status: AC
  Administered 2016-05-29: 1000 mg via ORAL
  Filled 2016-05-29: qty 2

## 2016-05-29 NOTE — ED Provider Notes (Signed)
[facility] DEPT Provider Note   CSN: [id number] Arrival date & time: 05/29/16  1657     History   Chief Complaint Chief Complaint  Patient presents with  . Eye Injury    HPI CANDY ZIEGLER is a 65 y.o. male.  The history is provided by the patient.  Eye Injury  This is a new problem. The current episode started yesterday. Episode frequency: once. Pertinent negatives include no chest pain, no abdominal pain, no headaches and no shortness of breath. Nothing aggravates the symptoms. Nothing relieves the symptoms. He has tried nothing for the symptoms.   [place] forward off of wheelchair onto another person's wheelchair. States, "my feet got caught up underneath the chair." No LOC. Complains of left eye blurry vision.  Take 81mg  ASA no other anticoagulation.  No other physical complaints.  Past Medical History:  Diagnosis Date  . Anxiety   . Chronic pain    legs, back; MRI 05/2012 with mild thoracic degenerative changes no spinal stenosis   . COPD (chronic obstructive pulmonary disease) ([place])   . Depression   . Diabetic peripheral neuropathy ([place])    "chronic" (05/27/2012)  . Diastolic CHF ([place])   . Hypercholesteremia   . Hypertension   . Peripheral edema   . PVD (peripheral vascular disease) ([place])   . Spinal stenosis    mild lumbar (MRI 05/2012)-L2-L3 to L4-L5 , mild lumbar foraminal stenosis   . Stroke ([place]) 05/2012   Subacute, lacunar infarcts within the left basal ganglia and posterior limp of the left internal capsule/thalamus; "RUE; both feet weak" (05/27/2012)  . Tremor   . Type II diabetes mellitus [facility])     Patient Active Problem List   Diagnosis Date Noted  . Edema 03/04/2015  . AKI (acute kidney injury) ([place]) 03/04/2015  . Fall 03/04/2015  . Generalized weakness 03/04/2015  . Diabetic peripheral neuropathy ([place])   . Anxiety   . PVD (peripheral vascular disease) ([place])   . HCAP (healthcare-associated pneumonia) 07/20/2014  . Pneumonia 07/20/2014  .  Weight gain 05/02/2014  . Coarse tremors 04/18/2014  . Anxiety state 04/18/2014  . Acute respiratory failure with hypoxia ([place]) 04/07/2014  . Chronic diastolic CHF (congestive heart failure) ([place]) 04/07/2014  . Solitary pulmonary nodule 04/07/2014  . COPD with exacerbation ([place]) 04/02/2014  . Hypoxia 04/02/2014  . CVA (cerebral infarction) 06/01/2012  . Chronic pain (back, legs) 05/30/2012  . Toe laceration, 4th toe 05/30/2012  . C. difficile diarrhea 05/30/2012  . Hypokalemia 05/30/2012  . Acute lacunar stroke ([place]) 05/27/2012  . DM type 2 (diabetes mellitus, type 2) ([place]) 05/27/2012  . Tobacco abuse 05/27/2012  . HTN (hypertension) 05/27/2012    Past Surgical History:  Procedure Laterality Date  . ANKLE SURGERY    . HERNIA REPAIR  01/05/2004   "belly button" (05/27/2012)       Home Medications    Prior to Admission medications   Medication Sig Start Date End Date Taking? Authorizing Provider  albuterol (PROVENTIL) (2.5 MG/3ML) 0.083% nebulizer solution Take 2.5 mg by nebulization every 3 (three) hours as needed for wheezing or shortness of breath.    Historical Provider, MD  ALPRAZolam Duanne Moron) 1 MG tablet Take 1 tablet (1 mg total) by mouth 3 (three) times daily. 03/07/15   Samuella Cota, MD  amLODipine (NORVASC) 10 MG tablet Take 10 mg by mouth daily.    Historical Provider, MD  aspirin EC 81 MG tablet Take 81 mg by mouth daily.    Historical Provider, MD  atorvastatin (LIPITOR) 20 MG tablet Take 20 mg by mouth daily.     Historical Provider, MD  calcium-vitamin D (OSCAL WITH D) 500-200 MG-UNIT per tablet Take 1 tablet by mouth 3 (three) times daily.    Historical Provider, MD  citalopram (CELEXA) 20 MG tablet Take 20 mg by mouth daily.    Historical Provider, MD  fish oil-omega-3 fatty acids 1000 MG capsule Take 1 g by mouth 2 (two) times daily.    Historical Provider, MD  Fluticasone-Salmeterol (ADVAIR) 250-50 MCG/DOSE AEPB Inhale 1 puff into the lungs every 12 (twelve)  hours.    Historical Provider, MD  furosemide (LASIX) 20 MG tablet Take 40 mg by mouth daily.     Historical Provider, MD  gabapentin (NEURONTIN) 800 MG tablet Take 800 mg by mouth 3 (three) times daily.    Historical Provider, MD  glimepiride (AMARYL) 2 MG tablet Take 2 mg by mouth daily with breakfast.    Historical Provider, MD  ipratropium (ATROVENT) 0.02 % nebulizer solution Take 0.5 mg by nebulization every 6 (six) hours as needed for wheezing or shortness of breath.    Historical Provider, MD  lisinopril (PRINIVIL,ZESTRIL) 5 MG tablet Take 5 mg by mouth daily.    Historical Provider, MD  metFORMIN (GLUCOPHAGE) 1000 MG tablet Take 1,000 mg by mouth 2 (two) times daily with a meal.  02/17/15   Historical Provider, MD  oxyCODONE-acetaminophen (PERCOCET) 10-325 MG tablet Take 1 tablet by mouth every 8 (eight) hours as needed for pain (moderate). 03/07/15   Samuella Cota, MD  polyethylene glycol [facility]) packet Take 17 g by mouth daily. Patient taking differently: Take 17 g by mouth daily as needed for mild constipation or moderate constipation.  04/09/14   Kathie Dike, MD  tiotropium (SPIRIVA) 18 MCG inhalation capsule Place 18 mcg into inhaler and inhale daily.    Historical Provider, MD  triamcinolone cream (KENALOG) 0.1 % Apply 1 application topically daily. Apply to affected area of bilateral lower legs once daily during dressing change.    Historical Provider, MD    Family History Family History  Problem Relation Age of Onset  . Family history unknown: Yes    Social History Social History  Substance Use Topics  . Smoking status: Former Smoker    Packs/day: 0.50    Years: 45.00    Types: Cigarettes    Quit date: 12/28/2014  . Smokeless tobacco: Never Used  . Alcohol use No     Comment: 05/27/2012 "drank gallons and gallons 20 yr ago or so; last drink  at least 10 yr ago"     Allergies   Blueberry flavor; Cucumber extract; Flexeril [cyclobenzaprine hcl]; and Kiwi  extract   Review of Systems Review of Systems  Constitutional: Negative for chills and fever.  HENT: Negative for ear pain and sore throat.   Eyes: Negative for pain and visual disturbance.  Respiratory: Negative for cough and shortness of breath.   Cardiovascular: Negative for chest pain and palpitations.  Gastrointestinal: Negative for abdominal pain and vomiting.  Genitourinary: Negative for dysuria and hematuria.  Musculoskeletal: Negative for arthralgias and back pain.  Skin: Negative for color change and rash.  Neurological: Negative for seizures, syncope and headaches.  All other systems reviewed and are negative.    Physical Exam Updated Vital Signs BP 151/65 (BP Location: Left Arm)   Pulse (!) 57   Temp 97.7 F (36.5 C) (Oral)   Resp 20   Ht 6' (1.829 m)   [employer]  300 lb (136.1 kg)   SpO2 97%   BMI 40.69 kg/m   Physical Exam  Constitutional: He is oriented to person, place, and time. He appears well-developed and well-nourished. No distress.  HENT:  Head: Normocephalic. Head is with contusion.    Right Ear: External ear normal.  Left Ear: External ear normal.  Mouth/Throat: Oropharynx is clear and moist.  Eyes: Conjunctivae and EOM are normal. Pupils are equal, round, and reactive to light. Right eye exhibits no discharge, no exudate and no hordeolum. No foreign body present in the right eye. Left eye exhibits no discharge, no exudate and no hordeolum. No foreign body present in the left eye. Right conjunctiva is not injected. Right conjunctiva has no hemorrhage. Left conjunctiva has no hemorrhage. No scleral icterus.  Acuity: L 20/30 R 20/25  Field of vision intact.  Left grade 1 hyphema.  Neck: Normal range of motion. Neck supple.  Cardiovascular: Regular rhythm and normal heart sounds.  Exam reveals no gallop and no friction rub.   No murmur heard. Pulses:      Radial pulses are 2+ on the right side, and 2+ on the left side.       Dorsalis pedis pulses are  2+ on the right side, and 2+ on the left side.  Pulmonary/Chest: Effort normal and breath sounds normal. No stridor. No respiratory distress.  Abdominal: Soft. He exhibits no distension. There is no tenderness.  Musculoskeletal:       Cervical back: He exhibits no bony tenderness.       Thoracic back: He exhibits no bony tenderness.       Lumbar back: He exhibits no bony tenderness.  Clavicle stable. Chest stable to AP/Lat compression. Pelvis stable to Lat compression. No obvious extremity deformity. No chest or abdominal wall contusion.  Neurological: He is alert and oriented to person, place, and time. GCS eye subscore is 4. GCS verbal subscore is 5. GCS motor subscore is 6.  Moving all extremities   Skin: Skin is warm. He is not diaphoretic.     ED Treatments / Results  Labs (all labs ordered are listed, but only abnormal results are displayed) Labs Reviewed - No data to display  EKG  EKG Interpretation None       Radiology Ct Head Wo Contrast  Result Date: 05/29/2016 CLINICAL DATA:  Patient fell last night onto motorized wheelchair. Hit left side of face complaining of post blurred vision. EXAM: CT HEAD AND ORBITS WITHOUT CONTRAST TECHNIQUE: Contiguous axial images were obtained from the base of the skull through the vertex without contrast. Multidetector CT imaging of the orbits was performed using the standard protocol without intravenous contrast. COMPARISON:  None. FINDINGS: CT HEAD FINDINGS BRAIN: Stable mild superficial and central atrophy. Chronic lacunar infarcts noted in the left thalamus adjacent to the posterior limb of the internal capsule and left caudate. No acute intracranial hemorrhage, mass or midline shift. Chronic stable small vessel ischemic change noted of the periventricular white matter. VASCULAR: Moderate calcific atherosclerosis of the carotid siphons and vertebral arteries. No hyperdense vessels. SKULL: No skull fracture. No significant scalp soft  tissue swelling. SINUSES/ORBITS: The mastoid air-cells are clear. Moderate ethmoid, mild right maxillary and mild sphenoid sinus mucosal thickening.The included ocular globes and orbital contents are non-suspicious. OTHER: Osteopenic appearance of the skullbase. CT ORBITS FINDINGS Orbits: No traumatic or inflammatory finding. Globes, optic nerves, orbital fat, extraocular muscles, vascular structures, and lacrimal glands are intact and unremarkable for acute disease. Visualized sinuses: Frontal sinuses  clear. Moderate ethmoid and mild sphenoid sinus mucosal thickening. Trace air-fluid level the right maxillary sinus. Slight nasal septal deviation to the right with right-sided nasal septal spur. Soft tissues: No significant soft tissue swelling identified. IMPRESSION: Chronic stable small vessel ischemic disease of periventricular white matter. Chronic left-sided gray matter lacunar infarcts. Intact orbits and globes. Small air-fluid level in the right maxillary sinus. Findings may reflect acute maxillary sinusitis in the appropriate clinical setting. Chronic mucosal thickening noted of the ethmoid and sphenoid sinuses. Electronically Signed   By: Ashley Royalty M.D.   On: 05/29/2016 18:29   Ct Orbits Wo Contrast  Result Date: 05/29/2016 CLINICAL DATA:  Patient fell last night onto motorized wheelchair. Hit left side of face complaining of post blurred vision. EXAM: CT HEAD AND ORBITS WITHOUT CONTRAST TECHNIQUE: Contiguous axial images were obtained from the base of the skull through the vertex without contrast. Multidetector CT imaging of the orbits was performed using the standard protocol without intravenous contrast. COMPARISON:  None. FINDINGS: CT HEAD FINDINGS BRAIN: Stable mild superficial and central atrophy. Chronic lacunar infarcts noted in the left thalamus adjacent to the posterior limb of the internal capsule and left caudate. No acute intracranial hemorrhage, mass or midline shift. Chronic stable  small vessel ischemic change noted of the periventricular white matter. VASCULAR: Moderate calcific atherosclerosis of the carotid siphons and vertebral arteries. No hyperdense vessels. SKULL: No skull fracture. No significant scalp soft tissue swelling. SINUSES/ORBITS: The mastoid air-cells are clear. Moderate ethmoid, mild right maxillary and mild sphenoid sinus mucosal thickening.The included ocular globes and orbital contents are non-suspicious. OTHER: Osteopenic appearance of the skullbase. CT ORBITS FINDINGS Orbits: No traumatic or inflammatory finding. Globes, optic nerves, orbital fat, extraocular muscles, vascular structures, and lacrimal glands are intact and unremarkable for acute disease. Visualized sinuses: Frontal sinuses clear. Moderate ethmoid and mild sphenoid sinus mucosal thickening. Trace air-fluid level the right maxillary sinus. Slight nasal septal deviation to the right with right-sided nasal septal spur. Soft tissues: No significant soft tissue swelling identified. IMPRESSION: Chronic stable small vessel ischemic disease of periventricular white matter. Chronic left-sided gray matter lacunar infarcts. Intact orbits and globes. Small air-fluid level in the right maxillary sinus. Findings may reflect acute maxillary sinusitis in the appropriate clinical setting. Chronic mucosal thickening noted of the ethmoid and sphenoid sinuses. Electronically Signed   By: Ashley Royalty M.D.   On: 05/29/2016 18:29    Procedures Procedures (including critical care time)  Medications Ordered in ED Medications  fluorescein ophthalmic strip 1 strip (not administered)  prednisoLONE acetate (PRED FORTE) 1 % ophthalmic suspension 1 drop (not administered)  acetaminophen (TYLENOL) tablet 1,000 mg (1,000 mg Oral Given 05/29/16 1747)     Initial Impression / Assessment and Plan / ED Course  I have reviewed the triage vital signs and the nursing notes.  Pertinent labs & imaging results that were available  during my care of the patient were reviewed by me and considered in my medical decision making (see chart for details).  Clinical Course     Consistent with hyphema of the left eye. No other injuries noted on CT scans. No evidence of iritis, orbital hematoma, globe rupture.  Discussed case with Dr. Kathlen Mody, ophthalmology who recommended Pred-forte ophthalmic drops and close follow-up in the clinic.  The patient is safe for discharge with strict return precautions.   Final Clinical Impressions(s) / ED Diagnoses   Final diagnoses:  Hyphema of left eye   Disposition: Discharge  Condition: Good  I have  discussed the results, Dx and Tx plan with the patient who expressed understanding and agree(s) with the plan. Discharge instructions discussed at great length. The patient was given strict return precautions who verbalized understanding of the instructions. No further questions at time of discharge.    New Prescriptions   No medications on file    Follow Up: [facility] Address: [address], [place], [place] [address] Phone: [phone] Call  For close follow up to assess for eye injury  [name], MD [place] [phone]   As needed      Fatima Blank, MD 05/29/16 1924

## 2016-05-29 NOTE — ED Triage Notes (Signed)
Fell last night at [employer]. Fell onto motorized wheelchair. Hit left aspect of face and has irregular pupil on left isde, c/o blurred vision in area.

## 2016-08-28 LAB — PROTIME-INR: INR: 1.1 (ref 0.9–1.1)

## 2016-08-29 IMAGING — CR DG CHEST 1V PORT
1 series · 1 of 1 positions shown · non-contrast
Comparison: 07/21/2014

CLINICAL DATA: Shortness of breath

EXAM:
PORTABLE CHEST 1 VIEW

[ap]
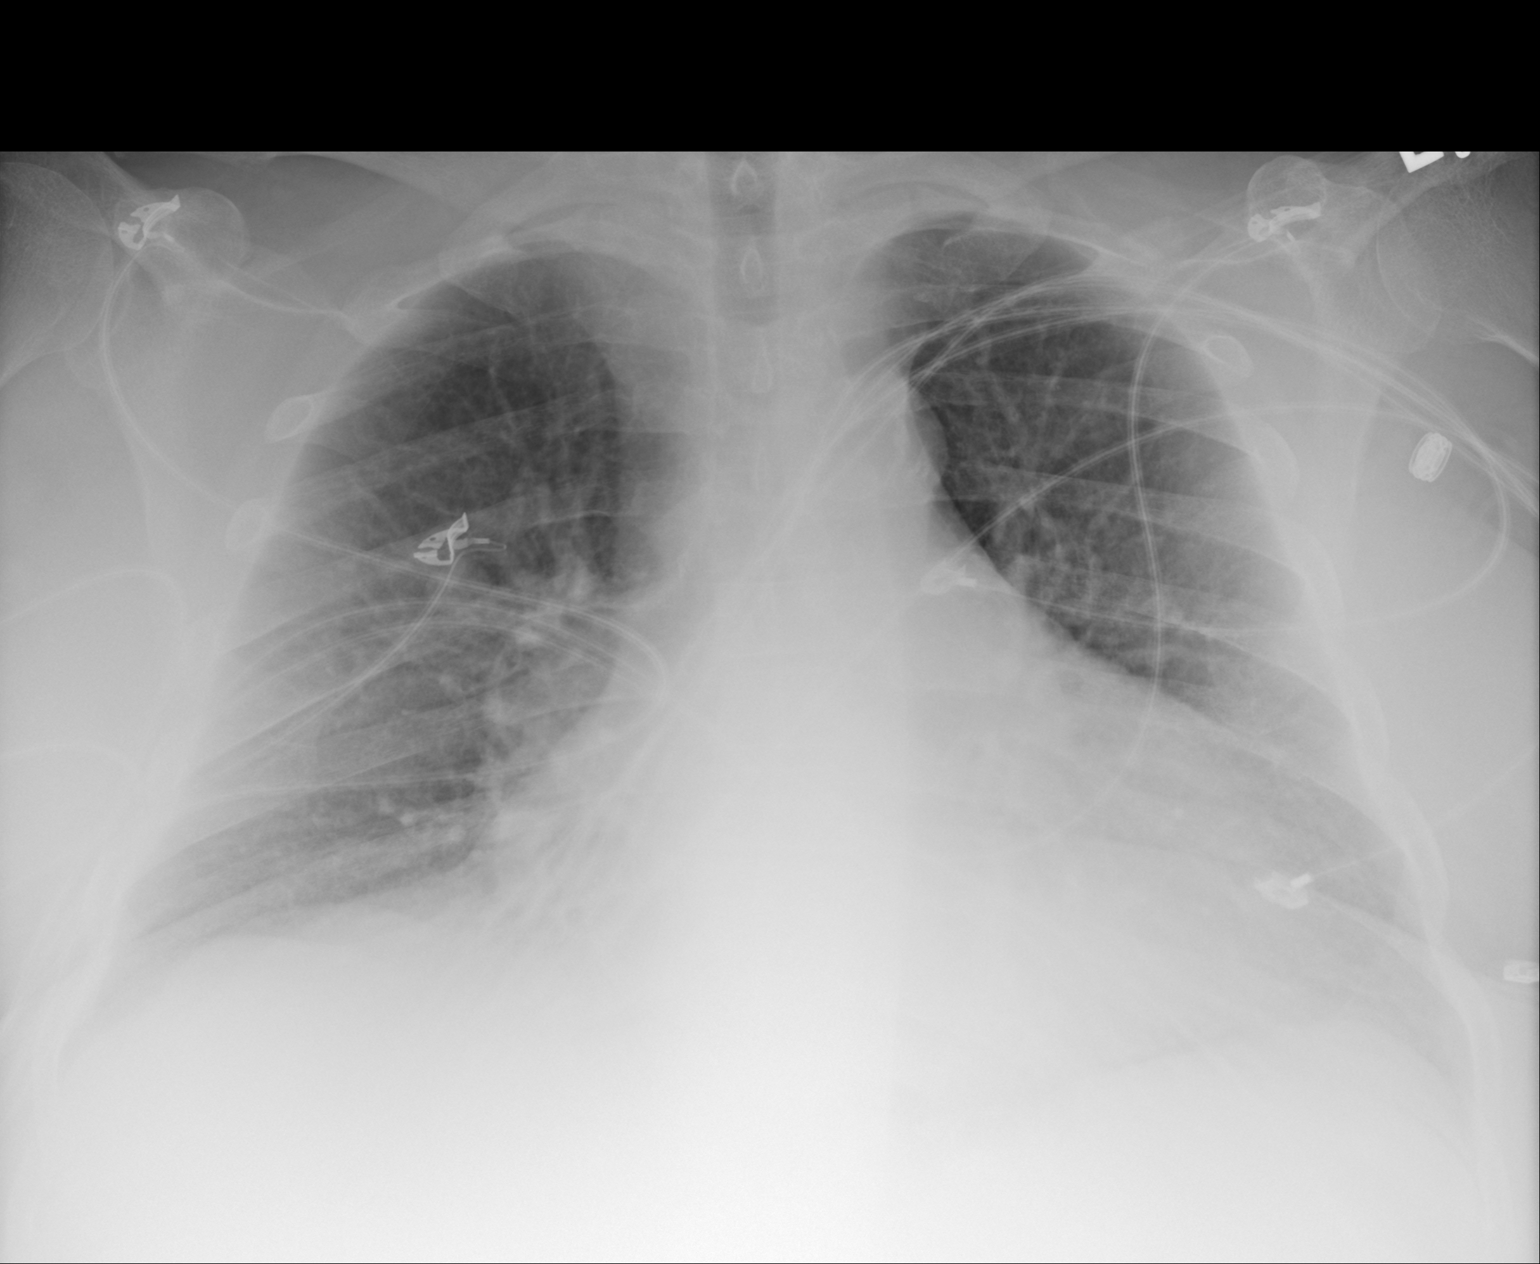

[1 of 1 positions shown; findings below may reference images not displayed]

FINDINGS: Chronic cardiomegaly. Negative aortic and hilar contours. Pulmonary
venous congestion without edema or effusion. Pneumonia noted
07/21/2014 has resolved. No consolidation, collapse, or air leak.
IMPRESSION: Chronic cardiomegaly with pulmonary venous congestion.

## 2016-09-06 ENCOUNTER — Other Ambulatory Visit: Payer: Self-pay | Admitting: Vascular Surgery

## 2016-09-06 DIAGNOSIS — L97519 Non-pressure chronic ulcer of other part of right foot with unspecified severity: Secondary | ICD-10-CM

## 2016-09-10 ENCOUNTER — Encounter: Payer: Self-pay | Admitting: Gastroenterology

## 2016-09-10 ENCOUNTER — Ambulatory Visit (INDEPENDENT_AMBULATORY_CARE_PROVIDER_SITE_OTHER): Payer: Medicare Other | Admitting: Gastroenterology

## 2016-09-10 DIAGNOSIS — Z86010 Personal history of colonic polyps: Secondary | ICD-10-CM | POA: Diagnosis not present

## 2016-09-10 DIAGNOSIS — K8020 Calculus of gallbladder without cholecystitis without obstruction: Secondary | ICD-10-CM | POA: Insufficient documentation

## 2016-09-10 DIAGNOSIS — K760 Fatty (change of) liver, not elsewhere classified: Secondary | ICD-10-CM | POA: Diagnosis not present

## 2016-09-10 DIAGNOSIS — A0472 Enterocolitis due to Clostridium difficile, not specified as recurrent: Secondary | ICD-10-CM

## 2016-09-10 NOTE — Assessment & Plan Note (Signed)
SYMPTOMS CONTROLLED/RESOLVED.  CONTINUE TO MONITOR SYMPTOMS. 

## 2016-09-10 NOTE — Assessment & Plan Note (Signed)
NO WARNING SIGNS/SYMPTOMS. PT REPORTS 13 POLYPS WERE REMOVED 4 YRS AGO.  OBTAIN TCS/PATH REPORT FROM [facility].

## 2016-09-10 NOTE — Progress Notes (Signed)
CC'D TO PCP °

## 2016-09-10 NOTE — Assessment & Plan Note (Signed)
MOST LIKELY DUE TO BMI > 35. LAST LIVER PANEL MAR 2018: NL ALT/AST.  HFP ONCE A YEAR WEIGHT LOSS RECOMMENDED FOLLOW UP IN 1 YEAR.

## 2016-09-10 NOTE — Progress Notes (Signed)
ON RECALL  °

## 2016-09-10 NOTE — Progress Notes (Signed)
Subjective:    Patient ID: Eric Scott, male    DOB: December 04, 1950, 66 y.o.   MRN: [id number] Eric Burly, MD  HPI REFERRED BECAUSE OF BIG LIVER AND GALLSTONES. AND DIARRHEA COUPLE TIMES IN PAST 1-2 WEEKS. BM: ONCE A DAY(NL). COLONOSCOPY 4 YEARS AGO-LOCATION??  PT DENIES FEVER, CHILLS, HEMATOCHEZIA, HEMATEMESIS, nausea, vomiting, melena, diarrhea, CHEST PAIN, SHORTNESS OF BREATH,  CHANGE IN BOWEL IN HABITS, constipation, abdominal pain, problems swallowing, problems with sedation, OR heartburn or indigestion.  Past Medical History:  Diagnosis Date  . Anxiety   . Chronic pain    legs, back; MRI 05/2012 with mild thoracic degenerative changes no spinal stenosis   . COPD (chronic obstructive pulmonary disease) ([place])   . Depression   . Diabetic peripheral neuropathy ([place])    "chronic" (05/27/2012)  . Diastolic CHF ([place])   . Hypercholesteremia   . Hypertension   . Peripheral edema   . PVD (peripheral vascular disease) ([place])   . Spinal stenosis    mild lumbar (MRI 05/2012)-L2-L3 to L4-L5 , mild lumbar foraminal stenosis   . Stroke ([place]) 05/2012   Subacute, lacunar infarcts within the left basal ganglia and posterior limp of the left internal capsule/thalamus; "RUE; both feet weak" (05/27/2012)  . Tremor   . Type II diabetes mellitus ([place])     Past Surgical History:  Procedure Laterality Date  . ANKLE SURGERY    . HERNIA REPAIR  01/05/2004   "belly button" (05/27/2012)    Allergies  Allergen Reactions  . Blueberry Flavor   . Cucumber Extract Other (See Comments)    "Fells like I'm having a heart attack"  . Flexeril [Cyclobenzaprine Hcl] Other (See Comments)    "whole body  Tremors" (05/27/2012)  . Kiwi Extract Other (See Comments)    "feels like I'm having a heart attack"   Current Outpatient Prescriptions  Medication Sig Dispense Refill  . albuterol (PROVENTIL) (2.5 MG/3ML) 0.083% nebulizer solution Take 2.5 mg by nebulization every 3 (three) hours as needed for  wheezing or shortness of breath.    . ALPRAZolam (XANAX) 1 MG tablet Take 1 tablet (1 mg total) by mouth 3 (three) times daily.    Marland Kitchen amLODipine (NORVASC) 10 MG tablet Take 10 mg by mouth daily.    Marland Kitchen aspirin EC 81 MG tablet Take 81 mg by mouth daily.    Marland Kitchen atorvastatin (LIPITOR) 20 MG tablet Take 20 mg by mouth daily.     . calcium-vitamin D (OSCAL WITH D) 500-200 MG-UNIT per tablet Take 1 tablet by mouth 3 (three) times daily.    . citalopram (CELEXA) 20 MG tablet Take 20 mg by mouth daily.    . fish oil-omega-3 fatty acids 1000 MG capsule Take 1 g by mouth 2 (two) times daily.    . Fluticasone-Salmeterol (ADVAIR) 250-50 MCG/DOSE AEPB Inhale 1 puff into the lungs every 12 (twelve) hours.    . furosemide (LASIX) 20 MG tablet Take 40 mg by mouth daily.     Marland Kitchen gabapentin (NEURONTIN) 800 MG tablet Take 800 mg by mouth 3 (three) times daily.    Marland Kitchen glimepiride (AMARYL) 2 MG tablet Take 2 mg by mouth daily with breakfast.    . insulin glargine (LANTUS) 100 UNIT/ML injection Inject 60 Units into the skin at bedtime.    . insulin lispro (HUMALOG) 100 UNIT/ML injection Inject into the skin 3 (three) times daily before meals.    Marland Kitchen ipratropium (ATROVENT) 0.02 % nebulizer solution Take 0.5 mg by nebulization every  6 (six) hours as needed for wheezing or shortness of breath.    . lisinopril (PRINIVIL,ZESTRIL) 5 MG tablet Take 5 mg by mouth daily.    Marland Kitchen oxyCODONE-acetaminophen (PERCOCET) 10-325 MG tablet Take 1 tablet by mouth every 8 (eight) hours as needed for pain (moderate).    . polyethylene glycol (MIRALAX) packet Take 17 g by mouth daily. (Patient taking differently: Take 17 g by mouth daily as needed for mild constipation or moderate constipation. )    . tiotropium (SPIRIVA) 18 MCG inhalation capsule Place 18 mcg into inhaler and inhale daily.    Marland Kitchen triamcinolone cream (KENALOG) 0.1 % Apply 1 application topically daily. Apply to affected area of bilateral lower legs once daily during dressing change.    .  metFORMIN (GLUCOPHAGE) 1000 MG tablet Take 1,000 mg by mouth 2 (two) times daily with a meal.      Family History  Problem Relation Age of Onset  . Family history unknown: Yes   Social History   Social History  . Marital status: Legally Separated    Spouse name: N/A  . Number of children: N/A  . Years of education: N/A   Social History Main Topics  . Smoking status: Former Smoker    Packs/day: 0.50    Years: 45.00    Types: Cigarettes    Quit date: 12/28/2014  . Smokeless tobacco: Never Used  . Alcohol use No     Comment: 05/27/2012 "drank gallons and gallons 20 yr ago or so; last drink  at least 10 yr ago"  . Drug use: No  . Sexual activity: No   Other Topics Concern  . None   Social History Narrative   Patient lives in [place] with a friend.   He is divorced. Has one kid.   Not working for about 7 years now. Used to do plumbing work.   Review of Systems PER HPI OTHERWISE ALL SYSTEMS ARE NEGATIVE.     Objective:   Physical Exam        Assessment & Plan:

## 2016-09-10 NOTE — Patient Instructions (Signed)
YOU SHOULD HAVE YOU LIVER CHECKED ONCE A YEAR.  THE TREATMENT FOR FATTY LIVER INCLUDES: 1. CONTROL DIABETES. 2. LOWER YOUR CHOLESTEROL 3. LOSE 20 POUNDS.  I WILL OBTAIN YOUR COLONOSCOPY REPORT FROM [facility].  FOLLOW UP IN 1 YEAR.

## 2016-09-10 NOTE — Assessment & Plan Note (Signed)
CURRENTLY PT DOES NOT C/O BILIARY COLIC.  CONTINUE TO MONITOR SYMPTOMS.

## 2016-09-20 ENCOUNTER — Telehealth: Payer: Self-pay | Admitting: *Deleted

## 2016-09-20 NOTE — Telephone Encounter (Signed)
Evelyn at [facility] in [place] called Triage just now to report that this patient has a "black foot". He has an appt with Dr. Bridgett Larsson on 10-12-16 (made from referral by Dr. Mal Amabile at [facility] on 09-06-16)to see [place] for nonhealing ulcers. Evidently between 09-06-16 and now his foot has deteriorated and they called to get him is asap. I reviewed his EPIC chart and could not find where we have seen him in the past. Given the deteriorating wound and "black foot", I have instructed Estill Bamberg to send the patient to [facility] ED for evaluation. She stated that she would discuss with Dr. Mal Amabile.

## 2016-10-04 ENCOUNTER — Encounter: Payer: Self-pay | Admitting: Vascular Surgery

## 2016-10-12 ENCOUNTER — Ambulatory Visit (INDEPENDENT_AMBULATORY_CARE_PROVIDER_SITE_OTHER): Payer: Medicare Other | Admitting: Vascular Surgery

## 2016-10-12 ENCOUNTER — Ambulatory Visit (HOSPITAL_COMMUNITY)
Admission: RE | Admit: 2016-10-12 | Discharge: 2016-10-12 | Disposition: A | Discharge status: Home / Self Care | Payer: Medicare Other | Source: Ambulatory Visit | Attending: Vascular Surgery | Admitting: Vascular Surgery

## 2016-10-12 ENCOUNTER — Encounter: Payer: Self-pay | Admitting: Vascular Surgery

## 2016-10-12 VITALS — BP 98/55 | HR 65 | Temp 98.1°F | Resp 20 | Ht 72.0 in | Wt 280.0 lb

## 2016-10-12 DIAGNOSIS — I872 Venous insufficiency (chronic) (peripheral): Secondary | ICD-10-CM | POA: Insufficient documentation

## 2016-10-12 DIAGNOSIS — I70229 Atherosclerosis of native arteries of extremities with rest pain, unspecified extremity: Secondary | ICD-10-CM | POA: Insufficient documentation

## 2016-10-12 DIAGNOSIS — L97519 Non-pressure chronic ulcer of other part of right foot with unspecified severity: Secondary | ICD-10-CM | POA: Insufficient documentation

## 2016-10-12 DIAGNOSIS — I998 Other disorder of circulatory system: Secondary | ICD-10-CM

## 2016-10-12 NOTE — Progress Notes (Signed)
Referred by:  [name], MD [place], [place] [address]  Reason for referral: bilateral foot wounds   History of Present Illness   Eric Scott is a 66 y.o. (1950/12/01) male IDDM who presents with chief complaint: non-healing wounds in left foot.  Patient has [place] standing history of VSU but over the last month he develop heel wound that is not healing.  Pt is not certain the mechanism of injury.  He denies any pain with the injury.  His ambulation is limited at this point due to prior spinal stenosis and stroke.  The patient denies any rest pain.  He does have some drainage from both feet. He has some blistering also in the R foot related to his CVI.  This patient's risks for atherosclerosis include: IDDM, HTN, HLD.  Past Medical History:  Diagnosis Date  . Anxiety   . Chronic pain    legs, back; MRI 05/2012 with mild thoracic degenerative changes no spinal stenosis   . COPD (chronic obstructive pulmonary disease) ([facility])   . Depression   . Diabetic peripheral neuropathy ([place])    "chronic" (05/27/2012)  . Diastolic CHF ([place])   . Hypercholesteremia   . Hypertension   . Peripheral edema   . PVD (peripheral vascular disease) ([place])   . Spinal stenosis    mild lumbar (MRI 05/2012)-L2-L3 to L4-L5 , mild lumbar foraminal stenosis   . Stroke ([facility]) 05/2012   Subacute, lacunar infarcts within the left basal ganglia and posterior limp of the left internal capsule/thalamus; "RUE; both feet weak" (05/27/2012)  . Tremor   . Type II diabetes mellitus ([facility])     Past Surgical History:  Procedure Laterality Date  . ANKLE SURGERY    . HERNIA REPAIR  01/05/2004   "belly button" (05/27/2012)    Social History   Social History  . Marital status: Legally Separated    Spouse name: N/A  . Number of children: N/A  . Years of education: N/A   Occupational History  . Not on file.   Social History Main Topics  . Smoking status: Former Smoker    Packs/day: 0.50   Years: 45.00    Types: Cigarettes    Quit date: 12/28/2014  . Smokeless tobacco: Never Used  . Alcohol use No     Comment: 05/27/2012 "drank gallons and gallons 20 yr ago or so; last drink  at least 10 yr ago"  . Drug use: No  . Sexual activity: No   Other Topics Concern  . Not on file   Social History Narrative   Patient lives in [place] with a friend.   He is divorced. Has one kid.   Not working for about 7 years now. Used to do plumbing work.    Family History  Problem Relation Age of Onset  . Family history unknown: Yes    Current Outpatient Prescriptions  Medication Sig Dispense Refill  . albuterol (PROVENTIL) (2.5 MG/3ML) 0.083% nebulizer solution Take 2.5 mg by nebulization every 3 (three) hours as needed for wheezing or shortness of breath.    Marland Kitchen aspirin EC 81 MG tablet Take 81 mg by mouth daily.    . calcium-vitamin D (OSCAL WITH D) 500-200 MG-UNIT per tablet Take 1 tablet by mouth 3 (three) times daily.    Marland Kitchen ALPRAZolam (XANAX) 1 MG tablet Take 1 tablet (1 mg total) by mouth 3 (three) times daily. 30 tablet 0  . amLODipine (NORVASC) 10 MG tablet Take 10 mg  by mouth daily.    Marland Kitchen atorvastatin (LIPITOR) 20 MG tablet Take 20 mg by mouth daily.     . citalopram (CELEXA) 20 MG tablet Take 20 mg by mouth daily.    . fish oil-omega-3 fatty acids 1000 MG capsule Take 1 g by mouth 2 (two) times daily.    . Fluticasone-Salmeterol (ADVAIR) 250-50 MCG/DOSE AEPB Inhale 1 puff into the lungs every 12 (twelve) hours.    . furosemide (LASIX) 20 MG tablet Take 40 mg by mouth daily.     Marland Kitchen gabapentin (NEURONTIN) 800 MG tablet Take 800 mg by mouth 3 (three) times daily.    Marland Kitchen glimepiride (AMARYL) 2 MG tablet Take 2 mg by mouth daily with breakfast.    . insulin glargine (LANTUS) 100 UNIT/ML injection Inject 60 Units into the skin at bedtime.    . insulin lispro (HUMALOG) 100 UNIT/ML injection Inject into the skin 3 (three) times daily before meals.    Marland Kitchen ipratropium (ATROVENT) 0.02 %  nebulizer solution Take 0.5 mg by nebulization every 6 (six) hours as needed for wheezing or shortness of breath.    . lisinopril (PRINIVIL,ZESTRIL) 5 MG tablet Take 5 mg by mouth daily.    . metFORMIN (GLUCOPHAGE) 1000 MG tablet Take 1,000 mg by mouth 2 (two) times daily with a meal.     . oxyCODONE-acetaminophen (PERCOCET) 10-325 MG tablet Take 1 tablet by mouth every 8 (eight) hours as needed for pain (moderate). 30 tablet 0  . polyethylene glycol (MIRALAX) packet Take 17 g by mouth daily. (Patient taking differently: Take 17 g by mouth daily as needed for mild constipation or moderate constipation. ) 14 each 0  . tiotropium (SPIRIVA) 18 MCG inhalation capsule Place 18 mcg into inhaler and inhale daily.    Marland Kitchen triamcinolone cream (KENALOG) 0.1 % Apply 1 application topically daily. Apply to affected area of bilateral lower legs once daily during dressing change.     No current facility-administered medications for this visit.     Allergies  Allergen Reactions  . Blueberry Flavor   . Cucumber Extract Other (See Comments)    "Fells like I'm having a heart attack"  . Flexeril [Cyclobenzaprine Hcl] Other (See Comments)    "whole body  Tremors" (05/27/2012)  . Kiwi Extract Other (See Comments)    "feels like I'm having a heart attack"     REVIEW OF SYSTEMS:   Cardiac:  positive for: Shortness of breath upon exertion, negative for: Chest pain or chest pressure and Shortness of breath when lying flat,   Vascular:  positive for: Leg swelling,  negative for: Pain in calf, thigh, or hip brought on by ambulation, Pain in feet at night that wakes you up from your sleep and Blood clot in your veins  Pulmonary:  positive for: no symptoms,  negative for: Oxygen at home, Productive cough and Wheezing  Neurologic:  positive for: No symptoms, negative for: Sudden weakness in arms or legs, Sudden numbness in arms or legs, Sudden onset of difficulty speaking or slurred speech, Temporary loss of  vision in one eye and Problems with dizziness  Gastrointestinal:  positive for: no symptoms, negative for: Blood in stool and Vomited blood  Genitourinary:  positive for: no symptoms, negative for: Burning when urinating and Blood in urine  Psychiatric:  positive for: no symptoms,  negative for: Major depression  Hematologic:  positive for: no symptoms,  negative for: negative for: Bleeding problems and Problems with blood clotting too easily  Dermatologic:  positive  for: Rashes or ulcers, negative for: cancers lesion  Constitutional:  positive for: no symptoms, negative for: Fever or chills  Ear/Nose/Throat:  positive for: no symptoms, negative for: Change in hearing, Nose bleeds and Sore throat  Musculoskeletal:  positive for: no symptoms, negative for: Back pain, Joint pain and Muscle pain   For VQI Use Only   PRE-ADM LIVING: Nursing home  AMB STATUS: Wheelchair  CAD Sx: None  PRIOR CHF: None  STRESS TEST: No   Physical Examination   Vitals:   10/12/16 0907  BP: (!) 98/55  Pulse: 65  Resp: 20  Temp: 98.1 F (36.7 C)  TempSrc: Oral  SpO2: 93%  Weight: 280 lb (127 kg)  Height: 6' (1.829 m)    Body mass index is 37.97 kg/m.  General Alert, O x 3, Obese, Ill appearing  Head [place]/AT,    Ear/Nose/Throat Hearing grossly intact, nares without erythema or drainage, oropharynx without Erythema or Exudate, Mallampati score: 3,   Eyes PERRLA, EOMI,    Neck Supple, mid-line trachea,    Pulmonary Sym exp, good B air movt, CTA B  Cardiac RRR, Nl S1, S2, no Murmurs, No rubs, No S3,S4  Vascular Vessel Right Left  Radial Palpable Palpable  Brachial Palpable Palpable  Carotid Palpable, No Bruit Palpable, No Bruit  Aorta Not palpable N/A  Femoral Not palpable due to pannus Not palpable due to pannus  Popliteal Not palpable Not palpable  PT Faintly palpable Faintly palpable  DP Faintly palpable Faintly palpable    Gastrointestinal soft, non-distended,  non-tender to palpation, No guarding or rebound, no HSM, no masses, no CVAT B, No palpable prominent aortic pulse,    Musculoskeletal M/S 5/5 throughout upper extremities, BLE 3/5, R foot with small blisters, extensive LDS in RLE, edema 2-3+, L foot with black eschar overlying heel, minimal drainage, extensive LDS in LLE, edema 3+, both feet demonstrate dystrophic nails with some black eschar  Neurologic Cranial nerves 2-12 intact , Pain and light touch intact in extremities except for decreased sensation in B feet, Motor exam as listed above  Psychiatric Judgement intact, Mood & affect appropriate for pt's clinical situation  Dermatologic See M/S exam for extremity exam, some psoriatic plaque evident on limbs  Lymphatic  Palpable lymph nodes: None    Non-Invasive Vascular Imaging   ABI (10/12/2016)  R:   ABI: 0.99,   PT: bi  DP: bi  TBI:  0.77  L:   ABI: 1.15,   PT: bi  DP: tri  TBI: 0.80   Outside Studies/Documentation   4 pages of outside documents were reviewed including: outside nursing home.   Medical Decision Making   Eric Scott is a 66 y.o. male who presents with: LLE critical limb ischemia, IDDM, CVI (C6) in form of venous stasis ulcers R foot   This patient has intact arterial flow into both feet and chronic venous changes bilaterally.  I suspect the left heel wound is combination of digital artery disease, infection and pressure injury.  In my hands, I have never been able to get this type of wound to heal.  I would defer mgmt of that leg to Dr. Sharol Given, as debridement of the dead skin +/- calcaneus debridement might be needed.  I will arrange for this consult ASAP.  In regards to his venous disease, he need BLE venous reflux duplex to evaluate the extent and severity of this disease.  If his superficial system is involved, ablation may help with recurrence of his venous  stasis ulcers.  I discussed in depth with the patient the nature of  atherosclerosis, and emphasized the importance of maximal medical management including strict control of blood pressure, blood glucose, and lipid levels, antiplatelet agents, obtaining regular exercise, and cessation of smoking.  The patient is aware that without maximal medical management the underlying atherosclerotic disease process will progress, limiting the benefit of any interventions. The patient is currently on a statin: Lipitor.  The patient is currently on an anti-platelet: ASA.  Thank you for allowing [place] to participate in this patient's care.   Adele Barthel, MD, FACS [facility] Office: [phone] Pager: [phone]  10/12/2016, 9:29 AM

## 2016-10-15 ENCOUNTER — Encounter (INDEPENDENT_AMBULATORY_CARE_PROVIDER_SITE_OTHER): Payer: Self-pay | Admitting: Orthopedic Surgery

## 2016-10-15 ENCOUNTER — Ambulatory Visit (INDEPENDENT_AMBULATORY_CARE_PROVIDER_SITE_OTHER): Payer: Medicare Other | Admitting: Orthopedic Surgery

## 2016-10-15 DIAGNOSIS — I739 Peripheral vascular disease, unspecified: Secondary | ICD-10-CM | POA: Diagnosis not present

## 2016-10-15 DIAGNOSIS — L97929 Non-pressure chronic ulcer of unspecified part of left lower leg with unspecified severity: Principal | ICD-10-CM

## 2016-10-15 DIAGNOSIS — F17200 Nicotine dependence, unspecified, uncomplicated: Secondary | ICD-10-CM | POA: Diagnosis not present

## 2016-10-15 DIAGNOSIS — I87333 Chronic venous hypertension (idiopathic) with ulcer and inflammation of bilateral lower extremity: Secondary | ICD-10-CM | POA: Diagnosis not present

## 2016-10-15 DIAGNOSIS — E1142 Type 2 diabetes mellitus with diabetic polyneuropathy: Secondary | ICD-10-CM

## 2016-10-15 DIAGNOSIS — L97919 Non-pressure chronic ulcer of unspecified part of right lower leg with unspecified severity: Secondary | ICD-10-CM

## 2016-10-15 NOTE — Progress Notes (Signed)
Office Visit Note   Patient: Eric Scott           Date of Birth: 1951/05/16           MRN: [id number] Visit Date: 10/15/2016              Requested by: [name], MD [place], [place] [address] PCP: [name], MD  Chief Complaint  Patient presents with  . Right Foot - Wound Check  . Left Foot - Wound Check      HPI: Patient is a 66 year old gentleman nursing home resident who states that he cannot walk ambulates in a wheelchair who has been seen by a physician at the wound center most recently has seen Dr. Bridgett Larsson and is referred here for evaluation treatment for his mixed arterial and venous insufficiency. Patient has undergone a vascular workup which shows adequate arterial circulation. Of note patient is a smoker with diabetes and has not tried to stop smoking.  Assessment & Plan: Visit Diagnoses:  1. 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 Diabetic polyneuropathy associated with type 2 diabetes mellitus ([place])   3. Arterial insufficiency of lower extremity ([name])   4. Smoker     Plan: Recommended the importance of smoking cessation. We will have his legs wrapped with silver alginate over the wounds with a Dynaflex wraps for both lower extremities discussed the importance of elevation of his feet level with his heart. Discussed that without smoking cessation elevation and compression he would require an amputation. Orders were written for skilled nursing to perform compression wraps weekly. He will continue to wear his PRAFO boots 24 hours a day  Follow-Up Instructions: Return in about 3 weeks (around 11/05/2016).   Ortho Exam  Patient is alert, oriented, no adenopathy, well-dressed, normal affect, normal respiratory effort. Examination patient has a weakly palpable dorsalis pedis pulse with significant swelling. Review of his ABI studies shows adequate arterial circulation. He has massive  venous stasis changes in both legs with brawny skin color changes a large decubitus heel ulcer on the left heel which is 4 x 5 cm with a thick eschar approximately 5 mm thick. There is no cellulitis no odor no drainage. Examination the lateral aspect of the right foot he has a wound which is about 1 x 3 cm and 3 mm deep. This has approximately 50% granulation tissue 50% fibrinous exudate. There is no drainage there is no exposed bone and no exposed tendon.  Imaging: No results found.  Labs: Lab Results  Component Value Date   HGBA1C 5.7 (H) 07/20/2014   HGBA1C 5.3 05/28/2012   HGBA1C 5.2 05/27/2012   LABURIC 11.5 (H) 08/24/2009   REPTSTATUS 07/22/2014 FINAL 07/21/2014   GRAMSTAIN  02/05/2013    RARE WBC PRESENT, PREDOMINANTLY PMN NO SQUAMOUS EPITHELIAL CELLS SEEN FEW GRAM POSITIVE COCCI IN PAIRS Performed at [facility] NO GROWTH 5 DAYS 07/20/2014   LABORGA STAPHYLOCOCCUS AUREUS 02/05/2013    Orders:  No orders of the defined types were placed in this encounter.  No orders of the defined types were placed in this encounter.    Procedures: No procedures performed  Clinical Data: No additional findings.  ROS:  All other systems negative, except as noted in the HPI. Review of Systems  Objective: Vital Signs: There were no vitals taken for this visit.  Specialty Comments:  No specialty comments available.  PMFS History:  Patient Active Problem List   Diagnosis Date Noted  . Chronic venous insufficiency 10/12/2016  . Critical lower limb ischemia 10/12/2016  . Fatty liver 09/10/2016  . History of colonic polyps 09/10/2016  . Gallstone 09/10/2016  . Edema 03/04/2015  . AKI (acute kidney injury) ([facility]) 03/04/2015  . Fall 03/04/2015  . Generalized weakness 03/04/2015  . Diabetic peripheral neuropathy ([place])   . Anxiety   . PVD (peripheral vascular disease) ([place])   . HCAP (healthcare-associated pneumonia) 07/20/2014  . Pneumonia 07/20/2014  . Weight  gain 05/02/2014  . Coarse tremors 04/18/2014  . Anxiety state 04/18/2014  . Acute respiratory failure with hypoxia ([place]) 04/07/2014  . Chronic diastolic CHF (congestive heart failure) ([place]) 04/07/2014  . Solitary pulmonary nodule 04/07/2014  . COPD with exacerbation ([place]) 04/02/2014  . Hypoxia 04/02/2014  . CVA (cerebral infarction) 06/01/2012  . Chronic pain (back, legs) 05/30/2012  . Toe laceration, 4th toe 05/30/2012  . C. difficile diarrhea 05/30/2012  . Hypokalemia 05/30/2012  . Acute lacunar stroke ([place]) 05/27/2012  . DM type 2 (diabetes mellitus, type 2) ([place]) 05/27/2012  . Tobacco abuse 05/27/2012  . HTN (hypertension) 05/27/2012   Past Medical History:  Diagnosis Date  . Anxiety   . Chronic pain    legs, back; MRI 05/2012 with mild thoracic degenerative changes no spinal stenosis   . COPD (chronic obstructive pulmonary disease) ([place])   . Depression   . Diabetic peripheral neuropathy ([place])    "chronic" (05/27/2012)  . Diastolic CHF ([place])   . Hypercholesteremia   . Hypertension   . Peripheral edema   . PVD (peripheral vascular disease) ([place])   . Spinal stenosis    mild lumbar (MRI 05/2012)-L2-L3 to L4-L5 , mild lumbar foraminal stenosis   . Stroke ([place]) 05/2012   Subacute, lacunar infarcts within the left basal ganglia and posterior limp of the left internal capsule/thalamus; "RUE; both feet weak" (05/27/2012)  . Tremor   . Type II diabetes mellitus ([name])     Family History  Problem Relation Age of Onset  . Family history unknown: Yes    Past Surgical History:  Procedure Laterality Date  . ANKLE SURGERY    . HERNIA REPAIR  01/05/2004   "belly button" (05/27/2012)   Social History   Occupational History  . Not on file.   Social History Main Topics  . Smoking status: Former Smoker    Packs/day: 0.50    Years: 45.00    Types: Cigarettes    Quit date: 12/28/2014  . Smokeless tobacco: Never Used  . Alcohol use No     Comment: 05/27/2012 "drank gallons and  gallons 20 yr ago or so; last drink  at least 10 yr ago"  . Drug use: No  . Sexual activity: No

## 2016-10-15 NOTE — Addendum Note (Signed)
Addended by: Lianne Cure A on: 10/15/2016 10:50 AM   Modules accepted: Orders

## 2016-10-23 ENCOUNTER — Other Ambulatory Visit (HOSPITAL_COMMUNITY): Payer: Self-pay | Admitting: Internal Medicine

## 2016-10-23 DIAGNOSIS — R310 Gross hematuria: Secondary | ICD-10-CM

## 2016-10-25 ENCOUNTER — Encounter (HOSPITAL_COMMUNITY): Payer: Self-pay

## 2016-10-25 ENCOUNTER — Ambulatory Visit (HOSPITAL_COMMUNITY)
Admission: RE | Admit: 2016-10-25 | Discharge: 2016-10-25 | Disposition: A | Discharge status: Home / Self Care | Payer: Medicare Other | Source: Ambulatory Visit | Attending: Internal Medicine | Admitting: Internal Medicine

## 2016-10-25 DIAGNOSIS — N3289 Other specified disorders of bladder: Secondary | ICD-10-CM | POA: Diagnosis not present

## 2016-10-25 DIAGNOSIS — R319 Hematuria, unspecified: Secondary | ICD-10-CM | POA: Diagnosis not present

## 2016-10-25 DIAGNOSIS — K5730 Diverticulosis of large intestine without perforation or abscess without bleeding: Secondary | ICD-10-CM | POA: Diagnosis not present

## 2016-10-25 DIAGNOSIS — I700 Atherosclerosis of aorta: Secondary | ICD-10-CM | POA: Diagnosis not present

## 2016-10-25 DIAGNOSIS — I2510 Atherosclerotic heart disease of native coronary artery without angina pectoris: Secondary | ICD-10-CM | POA: Insufficient documentation

## 2016-10-25 DIAGNOSIS — K8020 Calculus of gallbladder without cholecystitis without obstruction: Secondary | ICD-10-CM | POA: Insufficient documentation

## 2016-10-25 DIAGNOSIS — R310 Gross hematuria: Secondary | ICD-10-CM

## 2016-10-25 LAB — POCT I-STAT CREATININE: CREATININE: 1.2 mg/dL (ref 0.61–1.24)

## 2016-10-25 MED ORDER — IOPAMIDOL (ISOVUE-300) INJECTION 61%
125.0000 mL | Freq: Once | INTRAVENOUS | Status: AC | PRN
  Administered 2016-10-25: 125 mL via INTRAVENOUS

## 2016-11-05 ENCOUNTER — Ambulatory Visit (INDEPENDENT_AMBULATORY_CARE_PROVIDER_SITE_OTHER): Payer: Medicaid Other | Admitting: Orthopedic Surgery

## 2016-11-08 ENCOUNTER — Inpatient Hospital Stay (HOSPITAL_COMMUNITY)
Admission: EM | Admit: 2016-11-08 | Discharge: 2016-11-11 | DRG: 871 | Disposition: A | Discharge status: Skilled Nursing Facility | Payer: Medicare Other | Attending: Family Medicine | Admitting: Family Medicine

## 2016-11-08 ENCOUNTER — Ambulatory Visit (INDEPENDENT_AMBULATORY_CARE_PROVIDER_SITE_OTHER): Payer: Medicaid Other | Admitting: Orthopedic Surgery

## 2016-11-08 ENCOUNTER — Emergency Department (HOSPITAL_COMMUNITY): Payer: Medicare Other

## 2016-11-08 ENCOUNTER — Encounter (HOSPITAL_COMMUNITY): Payer: Self-pay

## 2016-11-08 DIAGNOSIS — I872 Venous insufficiency (chronic) (peripheral): Secondary | ICD-10-CM | POA: Diagnosis present

## 2016-11-08 DIAGNOSIS — E1151 Type 2 diabetes mellitus with diabetic peripheral angiopathy without gangrene: Secondary | ICD-10-CM | POA: Diagnosis present

## 2016-11-08 DIAGNOSIS — Z7982 Long term (current) use of aspirin: Secondary | ICD-10-CM

## 2016-11-08 DIAGNOSIS — I87309 Chronic venous hypertension (idiopathic) without complications of unspecified lower extremity: Secondary | ICD-10-CM | POA: Diagnosis present

## 2016-11-08 DIAGNOSIS — R509 Fever, unspecified: Secondary | ICD-10-CM | POA: Diagnosis present

## 2016-11-08 DIAGNOSIS — G9341 Metabolic encephalopathy: Secondary | ICD-10-CM | POA: Diagnosis present

## 2016-11-08 DIAGNOSIS — Z794 Long term (current) use of insulin: Secondary | ICD-10-CM

## 2016-11-08 DIAGNOSIS — N390 Urinary tract infection, site not specified: Secondary | ICD-10-CM | POA: Diagnosis present

## 2016-11-08 DIAGNOSIS — I739 Peripheral vascular disease, unspecified: Secondary | ICD-10-CM | POA: Diagnosis present

## 2016-11-08 DIAGNOSIS — D539 Nutritional anemia, unspecified: Secondary | ICD-10-CM | POA: Diagnosis present

## 2016-11-08 DIAGNOSIS — T07XXXA Unspecified multiple injuries, initial encounter: Secondary | ICD-10-CM | POA: Diagnosis present

## 2016-11-08 DIAGNOSIS — J449 Chronic obstructive pulmonary disease, unspecified: Secondary | ICD-10-CM | POA: Diagnosis present

## 2016-11-08 DIAGNOSIS — I110 Hypertensive heart disease with heart failure: Secondary | ICD-10-CM | POA: Diagnosis present

## 2016-11-08 DIAGNOSIS — E1169 Type 2 diabetes mellitus with other specified complication: Secondary | ICD-10-CM | POA: Diagnosis present

## 2016-11-08 DIAGNOSIS — Z87891 Personal history of nicotine dependence: Secondary | ICD-10-CM

## 2016-11-08 DIAGNOSIS — J9601 Acute respiratory failure with hypoxia: Secondary | ICD-10-CM | POA: Diagnosis present

## 2016-11-08 DIAGNOSIS — B952 Enterococcus as the cause of diseases classified elsewhere: Secondary | ICD-10-CM | POA: Diagnosis present

## 2016-11-08 DIAGNOSIS — E7800 Pure hypercholesterolemia, unspecified: Secondary | ICD-10-CM | POA: Diagnosis present

## 2016-11-08 DIAGNOSIS — Z993 Dependence on wheelchair: Secondary | ICD-10-CM | POA: Diagnosis not present

## 2016-11-08 DIAGNOSIS — I5032 Chronic diastolic (congestive) heart failure: Secondary | ICD-10-CM | POA: Diagnosis present

## 2016-11-08 DIAGNOSIS — I700 Atherosclerosis of aorta: Secondary | ICD-10-CM | POA: Diagnosis present

## 2016-11-08 DIAGNOSIS — Z8673 Personal history of transient ischemic attack (TIA), and cerebral infarction without residual deficits: Secondary | ICD-10-CM | POA: Diagnosis not present

## 2016-11-08 DIAGNOSIS — L8990 Pressure ulcer of unspecified site, unspecified stage: Secondary | ICD-10-CM | POA: Insufficient documentation

## 2016-11-08 DIAGNOSIS — A419 Sepsis, unspecified organism: Principal | ICD-10-CM | POA: Diagnosis present

## 2016-11-08 DIAGNOSIS — I5033 Acute on chronic diastolic (congestive) heart failure: Secondary | ICD-10-CM | POA: Diagnosis present

## 2016-11-08 DIAGNOSIS — R6520 Severe sepsis without septic shock: Secondary | ICD-10-CM | POA: Diagnosis present

## 2016-11-08 DIAGNOSIS — M86171 Other acute osteomyelitis, right ankle and foot: Secondary | ICD-10-CM | POA: Diagnosis present

## 2016-11-08 DIAGNOSIS — E1142 Type 2 diabetes mellitus with diabetic polyneuropathy: Secondary | ICD-10-CM | POA: Diagnosis not present

## 2016-11-08 DIAGNOSIS — E118 Type 2 diabetes mellitus with unspecified complications: Secondary | ICD-10-CM | POA: Diagnosis not present

## 2016-11-08 DIAGNOSIS — E1140 Type 2 diabetes mellitus with diabetic neuropathy, unspecified: Secondary | ICD-10-CM | POA: Diagnosis not present

## 2016-11-08 DIAGNOSIS — M86071 Acute hematogenous osteomyelitis, right ankle and foot: Secondary | ICD-10-CM | POA: Diagnosis not present

## 2016-11-08 DIAGNOSIS — M869 Osteomyelitis, unspecified: Secondary | ICD-10-CM

## 2016-11-08 DIAGNOSIS — E119 Type 2 diabetes mellitus without complications: Secondary | ICD-10-CM

## 2016-11-08 LAB — URINALYSIS, ROUTINE W REFLEX MICROSCOPIC
Bilirubin Urine: NEGATIVE
GLUCOSE, UA: NEGATIVE mg/dL
KETONES UR: NEGATIVE mg/dL
NITRITE: NEGATIVE
PH: 5 (ref 5.0–8.0)
Protein, ur: 100 mg/dL — AB
Specific Gravity, Urine: 1.017 (ref 1.005–1.030)

## 2016-11-08 LAB — COMPREHENSIVE METABOLIC PANEL
ALBUMIN: 2.6 g/dL — AB (ref 3.5–5.0)
ALT: 31 U/L (ref 17–63)
AST: 31 U/L (ref 15–41)
Alkaline Phosphatase: 98 U/L (ref 38–126)
Anion gap: 10 (ref 5–15)
BILIRUBIN TOTAL: 0.9 mg/dL (ref 0.3–1.2)
BUN: 22 mg/dL — ABNORMAL HIGH (ref 6–20)
CO2: 27 mmol/L (ref 22–32)
Calcium: 8.1 mg/dL — ABNORMAL LOW (ref 8.9–10.3)
Chloride: 97 mmol/L — ABNORMAL LOW (ref 101–111)
Creatinine, Ser: 1.43 mg/dL — ABNORMAL HIGH (ref 0.61–1.24)
GFR calc Af Amer: 57 mL/min — ABNORMAL LOW (ref >60)
GFR, EST NON AFRICAN AMERICAN: 50 mL/min — AB (ref >60)
Glucose, Bld: 175 mg/dL — ABNORMAL HIGH (ref 65–99)
POTASSIUM: 3.6 mmol/L (ref 3.5–5.1)
Sodium: 134 mmol/L — ABNORMAL LOW (ref 135–145)
TOTAL PROTEIN: 6.8 g/dL (ref 6.5–8.1)

## 2016-11-08 LAB — CBC WITH DIFFERENTIAL/PLATELET
BASOS ABS: 0 10*3/uL (ref 0.0–0.1)
Basophils Relative: 0 %
EOS PCT: 0 %
Eosinophils Absolute: 0 10*3/uL (ref 0.0–0.7)
HEMATOCRIT: 35.9 % — AB (ref 39.0–52.0)
HEMOGLOBIN: 12 g/dL — AB (ref 13.0–17.0)
LYMPHS PCT: 3 %
Lymphs Abs: 0.7 10*3/uL (ref 0.7–4.0)
MCH: 33.8 pg (ref 26.0–34.0)
MCHC: 33.4 g/dL (ref 30.0–36.0)
MCV: 101.1 fL — ABNORMAL HIGH (ref 78.0–100.0)
Monocytes Absolute: 0.7 10*3/uL (ref 0.1–1.0)
Monocytes Relative: 4 %
NEUTROS ABS: 18.3 10*3/uL — AB (ref 1.7–7.7)
NEUTROS PCT: 93 %
Platelets: 281 10*3/uL (ref 150–400)
RBC: 3.55 MIL/uL — AB (ref 4.22–5.81)
RDW: 16.4 % — AB (ref 11.5–15.5)
WBC Morphology: INCREASED
WBC: 19.7 10*3/uL — ABNORMAL HIGH (ref 4.0–10.5)

## 2016-11-08 LAB — LACTIC ACID, PLASMA: Lactic Acid, Venous: 1.6 mmol/L (ref 0.5–1.9)

## 2016-11-08 MED ORDER — PIPERACILLIN-TAZOBACTAM 3.375 G IVPB 30 MIN
3.3750 g | Freq: Once | INTRAVENOUS | Status: AC
  Administered 2016-11-08: 3.375 g via INTRAVENOUS
  Filled 2016-11-08: qty 50

## 2016-11-08 MED ORDER — SODIUM CHLORIDE 0.9 % IV BOLUS (SEPSIS)
1000.0000 mL | Freq: Once | INTRAVENOUS | Status: AC
  Administered 2016-11-08: 1000 mL via INTRAVENOUS

## 2016-11-08 MED ORDER — SODIUM CHLORIDE 0.9 % IV BOLUS (SEPSIS)
1000.0000 mL | Freq: Once | INTRAVENOUS | Status: AC
  Administered 2016-11-09: 1000 mL via INTRAVENOUS

## 2016-11-08 MED ORDER — ACETAMINOPHEN 325 MG PO TABS
650.0000 mg | ORAL_TABLET | Freq: Once | ORAL | Status: AC
  Administered 2016-11-08: 650 mg via ORAL

## 2016-11-08 MED ORDER — ACETAMINOPHEN 325 MG PO TABS
ORAL_TABLET | ORAL | Status: AC
  Filled 2016-11-08: qty 2

## 2016-11-08 NOTE — ED Provider Notes (Signed)
[facility] DEPT Provider Note   CSN: [id number] Arrival date & time: 11/08/16  1958     History   Chief Complaint Chief Complaint  Patient presents with  . Altered Mental Status   Level 5 caveat  HPI Eric Scott is a 66 y.o. male with a history including DM with peripheral neuropathy, COPD, CHF, HTN, peripheral vascular disease and h/o cva who presents from a local nursing home for altered mental status after falling this am.  It is unclear of the nature of the fall, but since the event he has become more confused and drowsy.  He is currently receiving IV vancomycin for treatment of non healing wounds of his bilateral legs secondary to his vascular disease.   The history is provided by the EMS personnel and the nursing home.    Past Medical History:  Diagnosis Date  . Anxiety   . Chronic pain    legs, back; MRI 05/2012 with mild thoracic degenerative changes no spinal stenosis   . COPD (chronic obstructive pulmonary disease) ([place])   . Depression   . Diabetic peripheral neuropathy ([place])    "chronic" (05/27/2012)  . Diastolic CHF ([place])   . Hypercholesteremia   . Hypertension   . Peripheral edema   . PVD (peripheral vascular disease) ([place])   . Spinal stenosis    mild lumbar (MRI 05/2012)-L2-L3 to L4-L5 , mild lumbar foraminal stenosis   . Stroke ([place]) 05/2012   Subacute, lacunar infarcts within the left basal ganglia and posterior limp of the left internal capsule/thalamus; "RUE; both feet weak" (05/27/2012)  . Tremor   . Type II diabetes mellitus [facility])     Patient Active Problem List   Diagnosis Date Noted  . Fever 11/08/2016  . Chronic venous insufficiency 10/12/2016  . Critical lower limb ischemia 10/12/2016  . Fatty liver 09/10/2016  . History of colonic polyps 09/10/2016  . Gallstone 09/10/2016  . Edema 03/04/2015  . AKI (acute kidney injury) ([place]) 03/04/2015  . Fall 03/04/2015  . Generalized weakness 03/04/2015  . Diabetic peripheral neuropathy ([place])     . Anxiety   . PVD (peripheral vascular disease) ([place])   . HCAP (healthcare-associated pneumonia) 07/20/2014  . Pneumonia 07/20/2014  . Weight gain 05/02/2014  . Coarse tremors 04/18/2014  . Anxiety state 04/18/2014  . Acute respiratory failure with hypoxia ([place]) 04/07/2014  . Chronic diastolic CHF (congestive heart failure) ([place]) 04/07/2014  . Solitary pulmonary nodule 04/07/2014  . COPD with exacerbation ([place]) 04/02/2014  . Hypoxia 04/02/2014  . CVA (cerebral infarction) 06/01/2012  . Chronic pain (back, legs) 05/30/2012  . Toe laceration, 4th toe 05/30/2012  . C. difficile diarrhea 05/30/2012  . Hypokalemia 05/30/2012  . Acute lacunar stroke ([place]) 05/27/2012  . DM type 2 (diabetes mellitus, type 2) ([place]) 05/27/2012  . Tobacco abuse 05/27/2012  . HTN (hypertension) 05/27/2012    Past Surgical History:  Procedure Laterality Date  . ANKLE SURGERY    . HERNIA REPAIR  01/05/2004   "belly button" (05/27/2012)       Home Medications    Prior to Admission medications   Medication Sig Start Date End Date Taking? Authorizing Provider  albuterol (PROVENTIL) (2.5 MG/3ML) 0.083% nebulizer solution Take 2.5 mg by nebulization every 3 (three) hours as needed for wheezing or shortness of breath.    [provider]  ALPRAZolam Duanne Moron) 1 MG tablet Take 1 tablet (1 mg total) by mouth 3 (three) times daily. 03/07/15   Samuella Cota, MD  amLODipine ([place])  10 MG tablet Take 10 mg by mouth daily.    [provider]  aspirin EC 81 MG tablet Take 81 mg by mouth daily.    [provider]  atorvastatin (LIPITOR) 20 MG tablet Take 20 mg by mouth daily.     [provider]  [place] Patter 100-25 MCG/INH AEPB  09/27/16   [provider]  calcium-vitamin D (OSCAL WITH D) 500-200 MG-UNIT per tablet Take 1 tablet by mouth 3 (three) times daily.    [provider]  citalopram (CELEXA) 20 MG tablet Take 20 mg by mouth daily.    [provider]  fish oil-omega-3 fatty acids 1000 MG capsule Take 1 g by mouth 2 (two) times daily.    [provider]  Fluticasone-Salmeterol (ADVAIR) 250-50 MCG/DOSE AEPB Inhale 1 puff into the lungs every 12 (twelve) hours.    [provider]  furosemide (LASIX) 20 MG tablet Take 40 mg by mouth daily.     [provider]  furosemide (LASIX) 40 MG tablet  10/03/16   [provider]  gabapentin (NEURONTIN) 600 MG tablet  10/10/16   [provider]  gabapentin (NEURONTIN) 800 MG tablet Take 800 mg by mouth 3 (three) times daily.    [provider]  glimepiride (AMARYL) 2 MG tablet Take 2 mg by mouth daily with breakfast.    [provider]  HUMALOG KWIKPEN 100 UNIT/ML KiwkPen  10/07/16   [provider]  insulin glargine (LANTUS) 100 UNIT/ML injection Inject 60 Units into the skin at bedtime.    [provider]  insulin lispro (HUMALOG) 100 UNIT/ML injection Inject into the skin 3 (three) times daily before meals.    [provider]  ipratropium (ATROVENT) 0.02 % nebulizer solution Take 0.5 mg by nebulization every 6 (six) hours as needed for wheezing or shortness of breath.    [provider]  ipratropium-albuterol (DUONEB) 0.5-2.5 (3) MG/3ML SOLN  07/09/16   [provider]  LANTUS SOLOSTAR 100 UNIT/ML Solostar Pen  09/27/16   [provider]  lisinopril (PRINIVIL,ZESTRIL) 5 MG tablet Take 5 mg by mouth daily.    [provider]  metFORMIN (GLUCOPHAGE) 1000 MG tablet Take 1,000 mg by mouth 2 (two) times daily with a meal.  02/17/15   [provider]  morphine (MS CONTIN) 15 MG 12 hr tablet  09/15/16   [provider]  oxyCODONE (OXY IR/ROXICODONE) 5 MG immediate release tablet  09/03/16   [provider]  oxyCODONE-acetaminophen (PERCOCET) 10-325 MG tablet Take 1 tablet by mouth every 8 (eight) hours as needed for pain (moderate). 03/07/15   Samuella Cota, MD  polyethylene glycol [facility]) packet Take 17 g by mouth daily. Patient taking differently: Take 17 g by mouth daily as needed for mild constipation or moderate constipation.  04/09/14   Kathie Dike, MD  SANTYL ointment  10/04/16   [provider]  tiotropium (SPIRIVA) 18 MCG inhalation capsule Place 18 mcg into inhaler and inhale daily.    [provider]  triamcinolone cream (KENALOG) 0.1 % Apply 1 application topically daily. Apply to affected area of bilateral lower legs once daily during dressing change.    [provider]  vancomycin Zollie Pee) 1-5 GM/200ML-% SOLN  07/17/16   [provider]    Family History Family History  Problem Relation Age of Onset  . Family history unknown: Yes    Social History Social History  Substance Use Topics  . Smoking  status: Former Smoker    Packs/day: 0.50    Years: 45.00    Types: Cigarettes    Quit date: 12/28/2014  . Smokeless tobacco: Never Used  . Alcohol use No     Comment: 05/27/2012 "drank gallons and gallons 20 yr ago or so; last drink  at least 10 yr ago"     Allergies   Blueberry flavor; Cucumber extract; Flexeril [cyclobenzaprine hcl]; and Kiwi extract   Review of Systems Review of Systems  Unable to perform ROS: Mental status change     Physical Exam Updated Vital Signs BP (!) 98/46   Pulse 85   Temp (!) 101.8 F (38.8 C) (Rectal)   Resp 13   Ht 6' (1.829 m)   Wt 129.3 kg (285 lb)   SpO2 94%   BMI 38.65 kg/m   Physical Exam  Constitutional: He appears well-developed. He appears lethargic. He is sleeping. He appears toxic.  HENT:  Head: Normocephalic and atraumatic.  Eyes: Pupils are equal, round, and reactive to light.  Neck: Normal range of motion.  Cardiovascular: Regular rhythm, normal heart sounds and intact distal pulses.   Borderline tachy  Pulmonary/Chest: Effort normal and breath sounds normal. No respiratory distress. He has no wheezes.  Abdominal: Soft.  Bowel sounds are normal. There is no tenderness. There is no guarding.  Musculoskeletal: Normal range of motion.  Neurological: He appears lethargic.  Skin: Skin is dry.  Bilateral lower extremities with venous stasis ulcers.  Right lateral foot with deep necrotic appearing ulcerations.  Red ulceration left medial heel which appears moist with purulent drainage.  Psychiatric: He has a normal mood and affect.  Nursing note and vitals reviewed.    ED Treatments / Results  Labs (all labs ordered are listed, but only abnormal results are displayed) Labs Reviewed  CBC WITH DIFFERENTIAL/PLATELET - Abnormal; Notable for the following:       Result Value   WBC 19.7 (*)    RBC 3.55 (*)    Hemoglobin 12.0 (*)    HCT 35.9 (*)    MCV 101.1 (*)    RDW 16.4 (*)    Neutro Abs 18.3 (*)    All other components within normal limits  URINALYSIS, ROUTINE W REFLEX MICROSCOPIC - Abnormal; Notable for the following:    APPearance CLOUDY (*)    Hgb urine dipstick SMALL (*)    Protein, ur 100 (*)    Leukocytes, UA MODERATE (*)    Bacteria, UA RARE (*)    Squamous Epithelial / LPF 0-5 (*)    All other components within normal limits  COMPREHENSIVE METABOLIC PANEL - Abnormal; Notable for the following:    Sodium 134 (*)    Chloride 97 (*)    Glucose, Bld 175 (*)    BUN 22 (*)    Creatinine, Ser 1.43 (*)    Calcium 8.1 (*)    Albumin 2.6 (*)    GFR calc non Af Amer 50 (*)    GFR calc Af Amer 57 (*)    All other components within normal limits  CULTURE, BLOOD (ROUTINE X 2)  CULTURE, BLOOD (ROUTINE X 2)  URINE CULTURE  LACTIC ACID, PLASMA    EKG  EKG Interpretation  Date/Time:  Thursday November 08 2016 20:07:55 EDT Ventricular Rate:  98 PR Interval:    QRS Duration: 102 QT Interval:  353 QTC Calculation: 451 R Axis:   -33 Text Interpretation:  Sinus rhythm Left axis deviation Borderline low voltage, extremity leads since  last tracing no significant change Confirmed by Daleen Bo [phone])  on 11/08/2016 8:21:33 PM       Radiology Ct Head Wo Contrast  Result Date: 11/08/2016 CLINICAL DATA:  Altered mental status.  Patient status post fall. EXAM: CT HEAD WITHOUT CONTRAST TECHNIQUE: Contiguous axial images were obtained from the base of the skull through the vertex without intravenous contrast. COMPARISON:  Brain CT 05/29/2016. FINDINGS: Brain: Periventricular and subcortical white matter hypodensity compatible with chronic microvascular ischemic changes. Old left basal ganglia lacunar infarct. Ventricles and sulci are prominent compatible with atrophy. No evidence for acute cortically based infarct, intracranial hemorrhage, mass lesion or mass-effect. Vascular: Unremarkable Skull: Intact. Sinuses/Orbits: Air-fluid levels within the maxillary sinuses bilaterally. Ethmoid air cell mucosal thickening. Orbits are unremarkable. Other: None. IMPRESSION: No acute intracranial process. 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 Air-fluid levels within the maxillary sinuses as can be seen with acute sinusitis in the appropriate clinical setting. Electronically Signed   By: Lovey Newcomer M.D.   On: 11/08/2016 21:35   Dg Chest Portable 1 View  Result Date: 11/08/2016 CLINICAL DATA:  Fever, altered mental status and fall today. Hypoxia. EXAM: PORTABLE CHEST 1 VIEW COMPARISON:  03/04/2015 FINDINGS: Right-sided PICC line tip is noted in the distal SVC. Heart is enlarged but stable with aortic atherosclerosis. Interstitial edema is slightly more pronounced than on prior. No pneumonic consolidation or pneumothorax. No effusion. No acute nor suspicious osseous lesions. IMPRESSION: 1. Stable cardiomegaly with aortic atherosclerosis. 2. Slight interval increase in interstitial pulmonary edema. 3. PICC line tip in the distal SVC. Electronically Signed   By: Ashley Royalty M.D.   On: 11/08/2016 20:46    Procedures Procedures (including critical care  time)  Medications Ordered in ED Medications  sodium chloride 0.9 % bolus 1,000 mL (not administered)  sodium chloride 0.9 % bolus 1,000 mL (0 mLs Intravenous Stopped 11/08/16 2256)  sodium chloride 0.9 % bolus 1,000 mL (0 mLs Intravenous Stopped 11/08/16 2256)  piperacillin-tazobactam (ZOSYN) IVPB 3.375 g (0 g Intravenous Stopped 11/08/16 2130)  acetaminophen (TYLENOL) tablet 650 mg (650 mg Oral Given 11/08/16 2054)     Initial Impression / Assessment and Plan / ED Course  I have reviewed the triage vital signs and the nursing notes.  Pertinent labs & imaging results that were available during my care of the patient were reviewed by me and considered in my medical decision making (see chart for details).     Pt with altered mental status with history of peripheral vascular disease with severe infections of bilateral lower extremities currently on vancomycin therapy.  Suspect this may be the source of patient's altered mental status and septic presentation.  He was given IV fluids while here, he has had a dose of Zosyn while here in addition to the vancomycin he received prior to arrival.  Pt will need admission for further management.   Pt was seen by Dr Oleta Mouse during this ed visit.  Final Clinical Impressions(s) / ED Diagnoses   Final diagnoses:  Sepsis, due to unspecified organism [facility])    New Prescriptions New Prescriptions   No medications on file     Landis Martins 11/08/16 2328    Evalee Jefferson, PA-C 11/08/16 2331    Forde Dandy, MD 11/09/16 [phone]

## 2016-11-08 NOTE — ED Notes (Signed)
Patient O2 was on 84 on room air. Put 2L of O2 patient stats is 90%

## 2016-11-08 NOTE — ED Triage Notes (Signed)
Pt is a resident of [facility] where he is being treated for bilat lower extrem infections, started on Vancomycin today per ems report.  Pt fell this am, and per report from [facility] staff to ems, pt has had some altered mental status.

## 2016-11-08 NOTE — ED Notes (Signed)
ED Provider at bedside. 

## 2016-11-08 NOTE — ED Provider Notes (Signed)
Medical screening examination/treatment/procedure(s) were conducted as a shared visit with non-physician practitioner(s) and myself.  I personally evaluated the patient during the encounter.   EKG Interpretation  Date/Time:  Thursday November 08 2016 20:07:55 EDT Ventricular Rate:  98 PR Interval:    QRS Duration: 102 QT Interval:  353 QTC Calculation: 451 R Axis:   -33 Text Interpretation:  Sinus rhythm Left axis deviation Borderline low voltage, extremity leads since last tracing no significant change Confirmed by Daleen Bo [phone]) on 11/08/2016 8:21:57 PM      66 year old male who presents with AMS. History of DM, PVD, diastolic HF, HTN, HLD. Presents with confusion today after mechanical fall. Just started on vancomycin for infection in bilateral lower extremities. Febrile in ED, near tachycardia, with 2 L oxygen requirement for hypoxia on room air. Source likely cellulitis and osteomyelitis involving wounds to bilateral feet. Possible UTI as well. Leukocytosis of 19 but normal lactate. CXR w/o pneumonia but possible mild interstitial edema. CT head visualized and normal. Received vancomycin earlier, given dose of Zosyn in ED  With IV fluids. Discussed with Dr. Shanon Brow will admit for ongoing management.  CRITICAL CARE Performed by: Forde Dandy   Total critical care time: 35 minutes  Critical care time was exclusive of separately billable procedures and treating other patients.  Critical care was necessary to treat or prevent imminent or life-threatening deterioration.  Critical care was time spent personally by me on the following activities: development of treatment plan with patient and/or surrogate as well as nursing, evaluation of patient's response to treatment, examination of patient, obtaining history from patient or surrogate, ordering and performing treatments and interventions, ordering and review of laboratory studies, ordering and review of radiographic studies, pulse  oximetry and re-evaluation of patient's condition.    Forde Dandy, MD 11/08/16 [phone]

## 2016-11-09 ENCOUNTER — Inpatient Hospital Stay (HOSPITAL_COMMUNITY): Payer: Medicare Other

## 2016-11-09 DIAGNOSIS — T07XXXA Unspecified multiple injuries, initial encounter: Secondary | ICD-10-CM

## 2016-11-09 DIAGNOSIS — E1142 Type 2 diabetes mellitus with diabetic polyneuropathy: Secondary | ICD-10-CM

## 2016-11-09 DIAGNOSIS — M869 Osteomyelitis, unspecified: Secondary | ICD-10-CM

## 2016-11-09 DIAGNOSIS — E118 Type 2 diabetes mellitus with unspecified complications: Secondary | ICD-10-CM

## 2016-11-09 DIAGNOSIS — L8990 Pressure ulcer of unspecified site, unspecified stage: Secondary | ICD-10-CM | POA: Insufficient documentation

## 2016-11-09 DIAGNOSIS — M86071 Acute hematogenous osteomyelitis, right ankle and foot: Secondary | ICD-10-CM

## 2016-11-09 DIAGNOSIS — I872 Venous insufficiency (chronic) (peripheral): Secondary | ICD-10-CM

## 2016-11-09 DIAGNOSIS — J9601 Acute respiratory failure with hypoxia: Secondary | ICD-10-CM

## 2016-11-09 DIAGNOSIS — R509 Fever, unspecified: Secondary | ICD-10-CM

## 2016-11-09 DIAGNOSIS — A419 Sepsis, unspecified organism: Secondary | ICD-10-CM

## 2016-11-09 LAB — GLUCOSE, CAPILLARY
GLUCOSE-CAPILLARY: 136 mg/dL — AB (ref 65–99)
Glucose-Capillary: 158 mg/dL — ABNORMAL HIGH (ref 65–99)
Glucose-Capillary: 143 mg/dL — ABNORMAL HIGH (ref 65–99)
Glucose-Capillary: 157 mg/dL — ABNORMAL HIGH (ref 65–99)

## 2016-11-09 LAB — MRSA PCR SCREENING: MRSA by PCR: POSITIVE — AB

## 2016-11-09 LAB — BASIC METABOLIC PANEL
ANION GAP: 6 (ref 5–15)
BUN: 23 mg/dL — ABNORMAL HIGH (ref 6–20)
CO2: 28 mmol/L (ref 22–32)
Calcium: 7.4 mg/dL — ABNORMAL LOW (ref 8.9–10.3)
Chloride: 103 mmol/L (ref 101–111)
Creatinine, Ser: 1.29 mg/dL — ABNORMAL HIGH (ref 0.61–1.24)
GFR calc Af Amer: >60 mL/min (ref >60)
GFR, EST NON AFRICAN AMERICAN: 56 mL/min — AB (ref >60)
Glucose, Bld: 164 mg/dL — ABNORMAL HIGH (ref 65–99)
POTASSIUM: 3.2 mmol/L — AB (ref 3.5–5.1)
Sodium: 137 mmol/L (ref 135–145)

## 2016-11-09 LAB — LACTIC ACID, PLASMA
Lactic Acid, Venous: 1.1 mmol/L (ref 0.5–1.9)
Lactic Acid, Venous: 0.6 mmol/L (ref 0.5–1.9)

## 2016-11-09 LAB — PROCALCITONIN: Procalcitonin: 1.18 ng/mL

## 2016-11-09 LAB — CBC
HEMATOCRIT: 29.7 % — AB (ref 39.0–52.0)
HEMOGLOBIN: 9.8 g/dL — AB (ref 13.0–17.0)
MCH: 34 pg (ref 26.0–34.0)
MCHC: 33 g/dL (ref 30.0–36.0)
MCV: 103.1 fL — ABNORMAL HIGH (ref 78.0–100.0)
Platelets: 237 10*3/uL (ref 150–400)
RBC: 2.88 MIL/uL — ABNORMAL LOW (ref 4.22–5.81)
RDW: 15.9 % — ABNORMAL HIGH (ref 11.5–15.5)
WBC: 10.8 10*3/uL — ABNORMAL HIGH (ref 4.0–10.5)

## 2016-11-09 LAB — C DIFFICILE QUICK SCREEN W PCR REFLEX
C Diff antigen: NEGATIVE
C Diff interpretation: NOT DETECTED
C Diff toxin: NEGATIVE

## 2016-11-09 MED ORDER — PIPERACILLIN-TAZOBACTAM 3.375 G IVPB
3.3750 g | Freq: Three times a day (TID) | INTRAVENOUS | Status: DC
  Administered 2016-11-09 – 2016-11-11 (×7): 3.375 g via INTRAVENOUS
  Filled 2016-11-09 (×7): qty 50

## 2016-11-09 MED ORDER — FUROSEMIDE 80 MG PO TABS
80.0000 mg | ORAL_TABLET | Freq: Every day | ORAL | Status: DC
  Administered 2016-11-09 – 2016-11-11 (×3): 80 mg via ORAL
  Filled 2016-11-09 (×4): qty 1

## 2016-11-09 MED ORDER — INSULIN GLARGINE 100 UNIT/ML ~~LOC~~ SOLN
24.0000 [IU] | Freq: Every day | SUBCUTANEOUS | Status: DC
  Filled 2016-11-09 (×3): qty 0.24

## 2016-11-09 MED ORDER — ALPRAZOLAM 1 MG PO TABS
1.0000 mg | ORAL_TABLET | Freq: Three times a day (TID) | ORAL | Status: DC
  Administered 2016-11-09 – 2016-11-11 (×7): 1 mg via ORAL
  Filled 2016-11-09 (×3): qty 2
  Filled 2016-11-09 (×4): qty 1

## 2016-11-09 MED ORDER — COLLAGENASE 250 UNIT/GM EX OINT
TOPICAL_OINTMENT | Freq: Every day | CUTANEOUS | Status: DC
  Administered 2016-11-09 – 2016-11-10 (×2): via TOPICAL
  Filled 2016-11-09: qty 30

## 2016-11-09 MED ORDER — SODIUM CHLORIDE 0.9 % IV SOLN
INTRAVENOUS | Status: DC
  Administered 2016-11-09: 03:00:00 via INTRAVENOUS

## 2016-11-09 MED ORDER — ONDANSETRON HCL 4 MG PO TABS: 4.0000 mg | ORAL_TABLET | Freq: Four times a day (QID) | ORAL | Status: DC | PRN

## 2016-11-09 MED ORDER — CHLORHEXIDINE GLUCONATE CLOTH 2 % EX PADS
6.0000 | MEDICATED_PAD | Freq: Every day | CUTANEOUS | Status: DC
  Administered 2016-11-09 – 2016-11-11 (×3): 6 via TOPICAL

## 2016-11-09 MED ORDER — ACETAMINOPHEN 650 MG RE SUPP: 650.0000 mg | Freq: Four times a day (QID) | RECTAL | Status: DC | PRN

## 2016-11-09 MED ORDER — ALBUTEROL SULFATE (2.5 MG/3ML) 0.083% IN NEBU: 2.5000 mg | INHALATION_SOLUTION | RESPIRATORY_TRACT | Status: DC | PRN

## 2016-11-09 MED ORDER — GABAPENTIN 400 MG PO CAPS
800.0000 mg | ORAL_CAPSULE | Freq: Three times a day (TID) | ORAL | Status: DC
  Administered 2016-11-09 – 2016-11-11 (×7): 800 mg via ORAL
  Filled 2016-11-09 (×7): qty 2

## 2016-11-09 MED ORDER — VANCOMYCIN HCL IN DEXTROSE 1-5 GM/200ML-% IV SOLN
1000.0000 mg | INTRAVENOUS | Status: AC
  Administered 2016-11-09 (×2): 1000 mg via INTRAVENOUS
  Filled 2016-11-09: qty 200

## 2016-11-09 MED ORDER — MORPHINE SULFATE ER 15 MG PO TBCR
15.0000 mg | EXTENDED_RELEASE_TABLET | Freq: Three times a day (TID) | ORAL | Status: DC
  Administered 2016-11-09 – 2016-11-11 (×6): 15 mg via ORAL
  Filled 2016-11-09 (×6): qty 1

## 2016-11-09 MED ORDER — CITALOPRAM HYDROBROMIDE 20 MG PO TABS
20.0000 mg | ORAL_TABLET | Freq: Every day | ORAL | Status: DC
  Administered 2016-11-09 – 2016-11-11 (×3): 20 mg via ORAL
  Filled 2016-11-09 (×3): qty 1

## 2016-11-09 MED ORDER — VANCOMYCIN HCL IN DEXTROSE 1-5 GM/200ML-% IV SOLN
1000.0000 mg | Freq: Two times a day (BID) | INTRAVENOUS | Status: DC
  Administered 2016-11-09 – 2016-11-11 (×4): 1000 mg via INTRAVENOUS
  Filled 2016-11-09 (×3): qty 200

## 2016-11-09 MED ORDER — INSULIN ASPART 100 UNIT/ML ~~LOC~~ SOLN
0.0000 [IU] | Freq: Three times a day (TID) | SUBCUTANEOUS | Status: DC
  Administered 2016-11-09: 2 [IU] via SUBCUTANEOUS
  Administered 2016-11-09: 1 [IU] via SUBCUTANEOUS
  Administered 2016-11-09 – 2016-11-10 (×3): 2 [IU] via SUBCUTANEOUS
  Administered 2016-11-10: 1 [IU] via SUBCUTANEOUS

## 2016-11-09 MED ORDER — POTASSIUM CHLORIDE CRYS ER 20 MEQ PO TBCR
40.0000 meq | EXTENDED_RELEASE_TABLET | Freq: Once | ORAL | Status: AC
  Administered 2016-11-09: 40 meq via ORAL
  Filled 2016-11-09: qty 2

## 2016-11-09 MED ORDER — OXYCODONE HCL 5 MG PO TABS
5.0000 mg | ORAL_TABLET | Freq: Three times a day (TID) | ORAL | Status: DC | PRN
  Administered 2016-11-09 – 2016-11-11 (×2): 5 mg via ORAL
  Filled 2016-11-09 (×2): qty 1

## 2016-11-09 MED ORDER — FLUTICASONE FUROATE-VILANTEROL 100-25 MCG/INH IN AEPB
1.0000 | INHALATION_SPRAY | Freq: Every day | RESPIRATORY_TRACT | Status: DC
  Administered 2016-11-10 – 2016-11-11 (×2): 1 via RESPIRATORY_TRACT
  Filled 2016-11-09: qty 28

## 2016-11-09 MED ORDER — IPRATROPIUM-ALBUTEROL 0.5-2.5 (3) MG/3ML IN SOLN
3.0000 mL | Freq: Three times a day (TID) | RESPIRATORY_TRACT | Status: DC
  Administered 2016-11-09 – 2016-11-10 (×2): 3 mL via RESPIRATORY_TRACT
  Filled 2016-11-09 (×2): qty 3

## 2016-11-09 MED ORDER — ONDANSETRON HCL 4 MG/2ML IJ SOLN: 4.0000 mg | Freq: Four times a day (QID) | INTRAMUSCULAR | Status: DC | PRN

## 2016-11-09 MED ORDER — ENOXAPARIN SODIUM 80 MG/0.8ML ~~LOC~~ SOLN
0.5000 mg/kg | SUBCUTANEOUS | Status: DC
  Administered 2016-11-09 – 2016-11-11 (×3): 65 mg via SUBCUTANEOUS
  Filled 2016-11-09 (×3): qty 0.8

## 2016-11-09 MED ORDER — ACETAMINOPHEN 325 MG PO TABS: 650.0000 mg | ORAL_TABLET | Freq: Four times a day (QID) | ORAL | Status: DC | PRN

## 2016-11-09 MED ORDER — MUPIROCIN 2 % EX OINT
1.0000 "application " | TOPICAL_OINTMENT | Freq: Two times a day (BID) | CUTANEOUS | Status: DC
  Administered 2016-11-09 – 2016-11-11 (×6): 1 via NASAL
  Filled 2016-11-09 (×2): qty 22

## 2016-11-09 NOTE — ED Notes (Signed)
Pt has had 3 large loose stools within an hour, collected stool sample while cleaning up pt and paged Dr. Shanon Brow to obtain order for

## 2016-11-09 NOTE — H&P (Signed)
History and Physical    Eric Scott:[id number] DOB: Nov 04, 1950 DOA: 11/08/2016  PCP: [name], MD  Patient coming from: SNF  Chief Complaint: fever  HPI: Eric Scott is a 66 y.o. male with medical history significant of chronic wounds to ble, PVD, venous insufficiency, chronic pain, COPD, HTN, DM comes in from SNF with fever and altered mental status.  Pt wounds were getting worse so was started on vancomycin yesterday.  I cannot find any known h/o osteomyelitis.  He is confused, slow to respond.  But can follow commands.  Pt temp was over 101.  bp is soft.  Pt referred for admission for sepsis likely due to his wounds.  Since arrival here in the ED however he has also had at least 4 loose BMs.   Review of Systems: unobtainable due to ams  Past Medical History:  Diagnosis Date  . Anxiety   . Chronic pain    legs, back; MRI 05/2012 with mild thoracic degenerative changes no spinal stenosis   . COPD (chronic obstructive pulmonary disease) ([place])   . Depression   . Diabetic peripheral neuropathy ([place])    "chronic" (05/27/2012)  . Diastolic CHF ([place])   . Hypercholesteremia   . Hypertension   . Peripheral edema   . PVD (peripheral vascular disease) ([place])   . Spinal stenosis    mild lumbar (MRI 05/2012)-L2-L3 to L4-L5 , mild lumbar foraminal stenosis   . Stroke ([place]) 05/2012   Subacute, lacunar infarcts within the left basal ganglia and posterior limp of the left internal capsule/thalamus; "RUE; both feet weak" (05/27/2012)  . Tremor   . Type II diabetes mellitus ([place])     Past Surgical History:  Procedure Laterality Date  . ANKLE SURGERY    . HERNIA REPAIR  01/05/2004   "belly button" (05/27/2012)     reports that he quit smoking about 22 months ago. His smoking use included Cigarettes. He has a 22.50 pack-year smoking history. He has never used smokeless tobacco. He reports that he does not drink alcohol or use drugs.  Allergies  Allergen Reactions  . Blueberry  Flavor   . Cucumber Extract Other (See Comments)    "Fells like I'm having a heart attack"  . Flexeril [Cyclobenzaprine Hcl] Other (See Comments)    "whole body  Tremors" (05/27/2012)  . Kiwi Extract Other (See Comments)    "feels like I'm having a heart attack"    Family History  Problem Relation Age of Onset  . Family history unknown: Yes    Prior to Admission medications   Medication Sig Start Date End Date Taking? Authorizing Provider  albuterol (PROVENTIL) (2.5 MG/3ML) 0.083% nebulizer solution Take 2.5 mg by nebulization every 3 (three) hours as needed for wheezing or shortness of breath.    [provider]  ALPRAZolam Duanne Moron) 1 MG tablet Take 1 tablet (1 mg total) by mouth 3 (three) times daily. 03/07/15   Samuella Cota, MD  amLODipine (NORVASC) 10 MG tablet Take 10 mg by mouth daily.    [provider]  aspirin EC 81 MG tablet Take 81 mg by mouth daily.    [provider]  atorvastatin (LIPITOR) 20 MG tablet Take 20 mg by mouth daily.     [provider]  [place] Patter 100-25 MCG/INH AEPB  09/27/16   [provider]  calcium-vitamin D (OSCAL WITH D) 500-200 MG-UNIT per tablet Take 1 tablet by mouth 3 (three) times daily.    [provider]  citalopram (CELEXA) 20 MG tablet Take 20 mg by mouth daily.    [provider]  fish oil-omega-3 fatty acids 1000 MG capsule Take 1 g by mouth 2 (two) times daily.    [provider]  Fluticasone-Salmeterol (ADVAIR) 250-50 MCG/DOSE AEPB Inhale 1 puff into the lungs every 12 (twelve) hours.    [provider]  furosemide (LASIX) 20 MG tablet Take 40 mg by mouth daily.     [provider]  furosemide (LASIX) 40 MG tablet  10/03/16   [provider]  gabapentin (NEURONTIN) 600 MG tablet  10/10/16   [provider]  gabapentin (NEURONTIN) 800 MG tablet Take 800 mg by mouth 3 (three) times daily.    [provider]  glimepiride  (AMARYL) 2 MG tablet Take 2 mg by mouth daily with breakfast.    [provider]  HUMALOG KWIKPEN 100 UNIT/ML KiwkPen  10/07/16   [provider]  insulin glargine (LANTUS) 100 UNIT/ML injection Inject 60 Units into the skin at bedtime.    [provider]  insulin lispro (HUMALOG) 100 UNIT/ML injection Inject into the skin 3 (three) times daily before meals.    [provider]  ipratropium (ATROVENT) 0.02 % nebulizer solution Take 0.5 mg by nebulization every 6 (six) hours as needed for wheezing or shortness of breath.    [provider]  ipratropium-albuterol (DUONEB) 0.5-2.5 (3) MG/3ML SOLN  07/09/16   [provider]  LANTUS SOLOSTAR 100 UNIT/ML Solostar Pen  09/27/16   [provider]  lisinopril (PRINIVIL,ZESTRIL) 5 MG tablet Take 5 mg by mouth daily.    [provider]  metFORMIN (GLUCOPHAGE) 1000 MG tablet Take 1,000 mg by mouth 2 (two) times daily with a meal.  02/17/15   [provider]  morphine (MS CONTIN) 15 MG 12 hr tablet  09/15/16   [provider]  oxyCODONE (OXY IR/ROXICODONE) 5 MG immediate release tablet  09/03/16   [provider]  oxyCODONE-acetaminophen (PERCOCET) 10-325 MG tablet Take 1 tablet by mouth every 8 (eight) hours as needed for pain (moderate). 03/07/15   Samuella Cota, MD  polyethylene glycol [facility]) packet Take 17 g by mouth daily. Patient taking differently: Take 17 g by mouth daily as needed for mild constipation or moderate constipation.  04/09/14   Kathie Dike, MD  SANTYL ointment  10/04/16   [provider]  tiotropium (SPIRIVA) 18 MCG inhalation capsule Place 18 mcg into inhaler and inhale daily.    [provider]  triamcinolone cream (KENALOG) 0.1 % Apply 1 application topically daily. Apply to affected area of bilateral lower legs once daily during dressing change.    [provider]  vancomycin [facility]) 1-5 GM/200ML-% SOLN  07/17/16    [provider]    Physical Exam: Vitals:   11/08/16 2130 11/08/16 2145 11/08/16 2200 11/08/16 2230  BP: (!) 95/46 (!) 94/55 (!) 92/42 (!) 98/46  Pulse:  85    Resp: (!) 24 16 15 13   Temp:      TempSrc:      SpO2:  94%    Weight:      Height:        Constitutional: NAD, calm, comfortable, slow to respond.  Dilated pupils Vitals:   11/08/16 2130 11/08/16 2145 11/08/16 2200 11/08/16 2230  BP: (!) 95/46 (!) 94/55 (!) 92/42 (!) 98/46  Pulse:  85    Resp: (!) 24 16 15 13   Temp:  TempSrc:      SpO2:  94%    Weight:      Height:       Eyes: PERRL, lids and conjunctivae normal ENMT: Mucous membranes are moist. Posterior pharynx clear of any exudate or lesions.Normal dentition.  Neck: normal, supple, no masses, no thyromegaly Respiratory: clear to auscultation bilaterally, no wheezing, no crackles. Normal respiratory effort. No accessory muscle use.  Cardiovascular: Regular rate and rhythm, no murmurs / rubs / gallops. No extremity edema. 2+ pedal pulses. No carotid bruits.  Abdomen: no tenderness, no masses palpated. No hepatosplenomegaly. Bowel sounds positive.  Musculoskeletal: no clubbing / cyanosis. No joint deformity upper and lower extremities. Good ROM, no contractures. Normal muscle tone.  Skin:  Ulcers to ble multiple with surrounding redness mild.  See RN notes for details of wounds Neurologic: CN 2-12 grossly intact. Sensation intact, DTR normal. Strength 5/5 in all 4.  Psychiatric: alert, slow to respond.  Not agitated  Labs on Admission: I have personally reviewed following labs and imaging studies  CBC:  Recent Labs Lab 11/08/16 2035  WBC 19.7*  NEUTROABS 18.3*  HGB 12.0*  HCT 35.9*  MCV 101.1*  PLT 492   Basic Metabolic Panel:  Recent Labs Lab 11/08/16 2035  NA 134*  K 3.6  CL 97*  CO2 27  GLUCOSE 175*  BUN 22*  CREATININE 1.43*  CALCIUM 8.1*   GFR: Estimated Creatinine Clearance: 70.7 mL/min (A) (by C-G formula based on SCr of  1.43 mg/dL (H)). Liver Function Tests:  Recent Labs Lab 11/08/16 2035  AST 31  ALT 31  ALKPHOS 98  BILITOT 0.9  PROT 6.8  ALBUMIN 2.6*   Urine analysis:    Component Value Date/Time   COLORURINE YELLOW 11/08/2016 2020   APPEARANCEUR CLOUDY (A) 11/08/2016 2020   LABSPEC 1.017 11/08/2016 2020   PHURINE 5.0 11/08/2016 2020   GLUCOSEU NEGATIVE 11/08/2016 2020   HGBUR SMALL (A) 11/08/2016 2020   BILIRUBINUR NEGATIVE 11/08/2016 2020   [place] 11/08/2016 2020   PROTEINUR 100 (A) 11/08/2016 2020   UROBILINOGEN 0.2 03/04/2015 1835   NITRITE NEGATIVE 11/08/2016 2020   LEUKOCYTESUR MODERATE (A) 11/08/2016 2020    Recent Results (from the past 240 hour(s))  Blood culture (routine x 2)     Status: None (Preliminary result)   Collection Time: 11/08/16  8:35 PM  Result Value Ref Range Status   Specimen Description LEFT ANTECUBITAL  Final   Special Requests   Final    BOTTLES DRAWN AEROBIC AND ANAEROBIC Blood Culture results may not be optimal due to an inadequate volume of blood received in culture bottles   Culture PENDING  Incomplete   Report Status PENDING  Incomplete  Blood culture (routine x 2)     Status: None (Preliminary result)   Collection Time: 11/08/16  8:35 PM  Result Value Ref Range Status   Specimen Description BLOOD LEFT HAND  Final   Special Requests   Final    BOTTLES DRAWN AEROBIC AND ANAEROBIC Blood Culture results may not be optimal due to an inadequate volume of blood received in culture bottles   Culture PENDING  Incomplete   Report Status PENDING  Incomplete     Radiological Exams on Admission: Ct Head Wo Contrast  Result Date: 11/08/2016 CLINICAL DATA:  Altered mental status.  Patient status post fall. EXAM: CT HEAD WITHOUT CONTRAST TECHNIQUE: Contiguous axial images were obtained from the base of the skull through the vertex without intravenous contrast. COMPARISON:  Brain CT  05/29/2016. FINDINGS: Brain: Periventricular and subcortical white  matter hypodensity compatible with chronic microvascular ischemic changes. Old left basal ganglia lacunar infarct. Ventricles and sulci are prominent compatible with atrophy. No evidence for acute cortically based infarct, intracranial hemorrhage, mass lesion or mass-effect. Vascular: Unremarkable Skull: Intact. Sinuses/Orbits: Air-fluid levels within the maxillary sinuses bilaterally. Ethmoid air cell mucosal thickening. Orbits are unremarkable. Other: None. IMPRESSION: No acute intracranial process. 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 Air-fluid levels within the maxillary sinuses as can be seen with acute sinusitis in the appropriate clinical setting. Electronically Signed   By: Lovey Newcomer M.D.   On: 11/08/2016 21:35   Dg Chest Portable 1 View  Result Date: 11/08/2016 CLINICAL DATA:  Fever, altered mental status and fall today. Hypoxia. EXAM: PORTABLE CHEST 1 VIEW COMPARISON:  03/04/2015 FINDINGS: Right-sided PICC line tip is noted in the distal SVC. Heart is enlarged but stable with aortic atherosclerosis. Interstitial edema is slightly more pronounced than on prior. No pneumonic consolidation or pneumothorax. No effusion. No acute nor suspicious osseous lesions. IMPRESSION: 1. Stable cardiomegaly with aortic atherosclerosis. 2. Slight interval increase in interstitial pulmonary edema. 3. PICC line tip in the distal SVC. Electronically Signed   By: Ashley Royalty M.D.   On: 11/08/2016 20:46     Assessment/Plan 66 yo male with multiple medical issues comes in with fever, chronic ble wounds, diarrhea  Principal Problem:   Fever w sepsis - getting 3rd liter of ivf now. MAP around 60.  Place on vanc/zosyn.  Checking cdiff for his diarrhea.  Lactic acid nml.  bp soft however.  Source could also be wounds.  Obtain bilateral mri foot to r/o osteomyelitis.  Wound care consult.  Give vanc/zosyn.  Culture data pending.  Active Problems:   Wounds,  multiple- as above   DM type 2 (diabetes mellitus, type 2) ([place])- ssi   HTN (hypertension)- holding all bp meds   Chronic diastolic CHF (congestive heart failure) ([place])- compensated at this time, getting ivf at this time due to above issue   Diabetic peripheral neuropathy ([place])- noted   PVD (peripheral vascular disease) ([place])- has had normal recent vascular studies   Chronic venous insufficiency- noted    DVT prophylaxis: scds Code Status:  full Family Communication: none  Disposition Plan:  Per day team Consults called:  none Admission status:  admission   [place] A MD [facility]  If 7PM-7AM, please contact night-coverage [email or web address] Password [email or web address]  11/09/2016, 12:10 AM

## 2016-11-09 NOTE — ED Notes (Signed)
Report given to Tyrone Schimke, RN ICU

## 2016-11-09 NOTE — Care Management Important Message (Signed)
Important Message  Patient Details  Name: Eric Scott MRN: [id number] Date of Birth: 07-21-1950   Medicare Important Message Given:  Yes    Kriste Broman, [place], RN 11/09/2016, 11:46 AM

## 2016-11-09 NOTE — Care Management Note (Signed)
Case Management Note  Patient Details  Name: Eric Scott MRN: [id number] Date of Birth: 02-Mar-1951  Subjective/Objective:  Adm with fever/leg wounds. Patient is from [place]. CSW aware of admission.               Action/Plan: Anticipate DC back to SNF when medically stable.    Expected Discharge Date:    11/11/2016           Expected Discharge Plan:  Skilled Nursing Facility  In-House Referral:  Clinical Social Work  Discharge planning Services  CM Consult  Post Acute Care Choice:    Choice offered to:     DME Arranged:    DME Agency:     HH Arranged:    [place] Agency:     Status of Service:  Completed, signed off  If discussed at [employer], dates discussed:    Additional Comments:  Doneisha Ivey, [place]uncey [place], RN 11/09/2016, 11:39 AM

## 2016-11-09 NOTE — Consult Note (Addendum)
[place] Nurse wound consult note Reason for Consult: Consult requested for BLE; performed using a remote camera and assistance from the bedside nurse for measurements and assessments.  Pt has chronic wounds and uses "some white cream" prior to admission.  Santyl is on his medication list. ABI was performed on 5/11, according to the EMR, but I was unable to locate results. Left heel with unstageable wound; 10X9cm; 40% red, 25% eschar, 35% yellow slough.  Mod amt yellow drainage, some odor. Right outer foot with unstageable wound; 2X4cm, black and fluctuant when probed, mod amt tan drainage, some odor. Right leg with scattered partial thickness wounds and dry scabs. Pressure Injury POA: Yes X-ray indicates: Acute osteomyelitis of the fifth metatarsal head and fifth proximal phalanx.  This complex medical condition is beyond the scope of [place] practice.  Please consult ortho team for further plan of care. Dressing procedure/placement/frequency: Float heels to reduce pressure. Topical treatment orders provided for staff; Santyl ointment for enzymatic debridement of nonviable tissue until further orders are available from the ortho team.  Called bedside nurse to inform her of the need for ortho consult and she will communicate with the primary team. Please re-consult if further assistance is needed.  Thank-you,  Julien Girt MSN, [place], [place], [place], [place]

## 2016-11-09 NOTE — Progress Notes (Signed)
ANTIBIOTIC CONSULT NOTE-Preliminary  Pharmacy Consult for vancomycin, zosyn Indication: wound infection  Allergies  Allergen Reactions  . Blueberry Flavor   . Cucumber Extract Other (See Comments)    "Fells like I'm having a heart attack"  . Flexeril [Cyclobenzaprine Hcl] Other (See Comments)    "whole body  Tremors" (05/27/2012)  . Kiwi Extract Other (See Comments)    "feels like I'm having a heart attack"    Patient Measurements: Height: 6' (182.9 cm) Weight: 280 lb 3.3 oz (127.1 kg) IBW/kg (Calculated) : 77.6 Adjusted Body Weight:   Vital Signs: Temp: 97.8 F (36.6 C) (06/08 0135) Temp Source: Oral (06/08 0135) BP: 80/47 (06/08 0215) Pulse Rate: 67 (06/08 0215)  Labs:  Recent Labs  11/08/16 2035  WBC 19.7*  HGB 12.0*  PLT 281  CREATININE 1.43*    Estimated Creatinine Clearance: 70 mL/min (A) (by C-G formula based on SCr of 1.43 mg/dL (H)).  No results for input(s): VANCOTROUGH, VANCOPEAK, VANCORANDOM, GENTTROUGH, GENTPEAK, GENTRANDOM, TOBRATROUGH, TOBRAPEAK, TOBRARND, AMIKACINPEAK, AMIKACINTROU, AMIKACIN in the last 72 hours.   Microbiology: Recent Results (from the past 720 hour(s))  Blood culture (routine x 2)     Status: None (Preliminary result)   Collection Time: 11/08/16  8:35 PM  Result Value Ref Range Status   Specimen Description LEFT ANTECUBITAL  Final   Special Requests   Final    BOTTLES DRAWN AEROBIC AND ANAEROBIC Blood Culture results may not be optimal due to an inadequate volume of blood received in culture bottles   Culture PENDING  Incomplete   Report Status PENDING  Incomplete  Blood culture (routine x 2)     Status: None (Preliminary result)   Collection Time: 11/08/16  8:35 PM  Result Value Ref Range Status   Specimen Description BLOOD LEFT HAND  Final   Special Requests   Final    BOTTLES DRAWN AEROBIC AND ANAEROBIC Blood Culture results may not be optimal due to an inadequate volume of blood received in culture bottles   Culture  PENDING  Incomplete   Report Status PENDING  Incomplete    Medical History: Past Medical History:  Diagnosis Date  . Anxiety   . Chronic pain    legs, back; MRI 05/2012 with mild thoracic degenerative changes no spinal stenosis   . COPD (chronic obstructive pulmonary disease) ([place])   . Depression   . Diabetic peripheral neuropathy ([place])    "chronic" (05/27/2012)  . Diastolic CHF ([place])   . Hypercholesteremia   . Hypertension   . Peripheral edema   . PVD (peripheral vascular disease) ([place])   . Spinal stenosis    mild lumbar (MRI 05/2012)-L2-L3 to L4-L5 , mild lumbar foraminal stenosis   . Stroke ([place]) 05/2012   Subacute, lacunar infarcts within the left basal ganglia and posterior limp of the left internal capsule/thalamus; "RUE; both feet weak" (05/27/2012)  . Tremor   . Type II diabetes mellitus ([name])     Medications:  Prescriptions Prior to Admission  Medication Sig Dispense Refill Last Dose  . albuterol (PROVENTIL) (2.5 MG/3ML) 0.083% nebulizer solution Take 2.5 mg by nebulization every 3 (three) hours as needed for wheezing or shortness of breath.   Taking  . ALPRAZolam (XANAX) 1 MG tablet Take 1 tablet (1 mg total) by mouth 3 (three) times daily. 30 tablet 0 Taking  . amLODipine (NORVASC) 10 MG tablet Take 10 mg by mouth daily.   Taking  . aspirin EC 81 MG tablet Take 81 mg by mouth daily.  Taking  . atorvastatin (LIPITOR) 20 MG tablet Take 20 mg by mouth daily.    Taking  . BREO ELLIPTA 100-25 MCG/INH AEPB    Taking  . calcium-vitamin D (OSCAL WITH D) 500-200 MG-UNIT per tablet Take 1 tablet by mouth 3 (three) times daily.   Taking  . citalopram (CELEXA) 20 MG tablet Take 20 mg by mouth daily.   Taking  . fish oil-omega-3 fatty acids 1000 MG capsule Take 1 g by mouth 2 (two) times daily.   Taking  . Fluticasone-Salmeterol (ADVAIR) 250-50 MCG/DOSE AEPB Inhale 1 puff into the lungs every 12 (twelve) hours.   Taking  . furosemide (LASIX) 20 MG tablet Take 40 mg by mouth  daily.    Taking  . furosemide (LASIX) 40 MG tablet    Taking  . gabapentin (NEURONTIN) 600 MG tablet    Taking  . gabapentin (NEURONTIN) 800 MG tablet Take 800 mg by mouth 3 (three) times daily.   Taking  . glimepiride (AMARYL) 2 MG tablet Take 2 mg by mouth daily with breakfast.   Taking  . HUMALOG KWIKPEN 100 UNIT/ML KiwkPen    Taking  . insulin glargine (LANTUS) 100 UNIT/ML injection Inject 60 Units into the skin at bedtime.   Taking  . insulin lispro (HUMALOG) 100 UNIT/ML injection Inject into the skin 3 (three) times daily before meals.   Taking  . ipratropium (ATROVENT) 0.02 % nebulizer solution Take 0.5 mg by nebulization every 6 (six) hours as needed for wheezing or shortness of breath.   Taking  . ipratropium-albuterol (DUONEB) 0.5-2.5 (3) MG/3ML SOLN    Taking  . LANTUS SOLOSTAR 100 UNIT/ML Solostar Pen    Taking  . lisinopril (PRINIVIL,ZESTRIL) 5 MG tablet Take 5 mg by mouth daily.   Taking  . metFORMIN (GLUCOPHAGE) 1000 MG tablet Take 1,000 mg by mouth 2 (two) times daily with a meal.    Not Taking  . morphine (MS CONTIN) 15 MG 12 hr tablet    Taking  . oxyCODONE (OXY IR/ROXICODONE) 5 MG immediate release tablet    Taking  . oxyCODONE-acetaminophen (PERCOCET) 10-325 MG tablet Take 1 tablet by mouth every 8 (eight) hours as needed for pain (moderate). 30 tablet 0 Taking  . polyethylene glycol (MIRALAX) packet Take 17 g by mouth daily. (Patient taking differently: Take 17 g by mouth daily as needed for mild constipation or moderate constipation. ) 14 each 0 Taking  . SANTYL ointment    Taking  . tiotropium (SPIRIVA) 18 MCG inhalation capsule Place 18 mcg into inhaler and inhale daily.   Taking  . triamcinolone cream (KENALOG) 0.1 % Apply 1 application topically daily. Apply to affected area of bilateral lower legs once daily during dressing change.   Taking  . vancomycin (VANCOCIN) 1-5 GM/200ML-% SOLN    Taking   Scheduled:  . enoxaparin (LOVENOX) injection  0.5 mg/kg Subcutaneous  Q24H  . insulin aspart  0-9 Units Subcutaneous TID WC   Infusions:  . sodium chloride    . piperacillin-tazobactam (ZOSYN)  IV    . vancomycin     PRN: acetaminophen **OR** acetaminophen, ondansetron **OR** ondansetron (ZOFRAN) IV Anti-infectives    Start     Dose/Rate Route Frequency Ordered Stop   11/09/16 0500  piperacillin-tazobactam (ZOSYN) IVPB 3.375 g     3.375 g 12.5 mL/hr over 240 Minutes Intravenous Every 8 hours 11/09/16 0215     11/09/16 0230  vancomycin (VANCOCIN) IVPB 1000 mg/200 mL premix     1,000 mg  200 mL/hr over 60 Minutes Intravenous Every 1 hr x 2 11/09/16 0215 11/09/16 0414   11/08/16 2045  piperacillin-tazobactam (ZOSYN) IVPB 3.375 g     3.375 g 100 mL/hr over 30 Minutes Intravenous  Once 11/08/16 2033 11/08/16 2130      Assessment: 66 yo male in ED with AMS. Has bilateral lower extremity infections.  Was started on vancomycin 500mg  Q12 hours at outside facility on 6/7.  Has likely received 2 doses thus far.  Too soon for level, doses are low for patient's weight and CrCl.  Will proceed with loading dose of 2 grams vancomycin now to achieve therapeutic level.  Goal of Therapy:  Vancomycin trough level 15-20 mcg/ml  Plan:  Preliminary review of pertinent patient information completed.  Protocol will be initiated with dose(s) of vancomycin 2 grams and zosyn 3.375grams.  Forestine Na clinical pharmacist will complete review during morning rounds to assess patient and finalize treatment regimen if needed.  Rainey Kahrs, [place], RPH 11/09/2016,2:40 AM

## 2016-11-09 NOTE — Progress Notes (Signed)
[place] for vancomycin, zosyn Indication: wound infection  Allergies  Allergen Reactions  . Blueberry Flavor   . Cucumber Extract Other (See Comments)    "Fells like I'm having a heart attack"  . Flexeril [Cyclobenzaprine Hcl] Other (See Comments)    "whole body  Tremors" (05/27/2012)  . Kiwi Extract Other (See Comments)    "feels like I'm having a heart attack"   Patient Measurements: Height: 6' (182.9 cm) Weight: 280 lb 3.3 oz (127.1 kg) IBW/kg (Calculated) : 77.6  Vital Signs: Temp: 98.5 F (36.9 C) (06/08 0713) Temp Source: Oral (06/08 0713) BP: 92/51 (06/08 0700) Pulse Rate: 60 (06/08 0700)  Labs:  Recent Labs  11/08/16 2035 11/09/16 0340  WBC 19.7* 10.8*  HGB 12.0* 9.8*  PLT 281 237  CREATININE 1.43* 1.29*   Estimated Creatinine Clearance: 77.6 mL/min (A) (by C-G formula based on SCr of 1.29 mg/dL (H)).  No results for input(s): VANCOTROUGH, VANCOPEAK, VANCORANDOM, GENTTROUGH, GENTPEAK, GENTRANDOM, TOBRATROUGH, TOBRAPEAK, TOBRARND, AMIKACINPEAK, AMIKACINTROU, AMIKACIN in the last 72 hours.   Microbiology: Recent Results (from the past 720 hour(s))  Blood culture (routine x 2)     Status: None (Preliminary result)   Collection Time: 11/08/16  8:35 PM  Result Value Ref Range Status   Specimen Description LEFT ANTECUBITAL  Final   Special Requests   Final    BOTTLES DRAWN AEROBIC AND ANAEROBIC Blood Culture results may not be optimal due to an inadequate volume of blood received in culture bottles   Culture PENDING  Incomplete   Report Status PENDING  Incomplete  Blood culture (routine x 2)     Status: None (Preliminary result)   Collection Time: 11/08/16  8:35 PM  Result Value Ref Range Status   Specimen Description BLOOD LEFT HAND  Final   Special Requests   Final    BOTTLES DRAWN AEROBIC AND ANAEROBIC Blood Culture results may not be optimal due to an inadequate volume of blood received in culture bottles   Culture  PENDING  Incomplete   Report Status PENDING  Incomplete  C difficile quick scan w PCR reflex     Status: None   Collection Time: 11/09/16  1:25 AM  Result Value Ref Range Status   C Diff antigen NEGATIVE NEGATIVE Final   C Diff toxin NEGATIVE NEGATIVE Final   C Diff interpretation No C. difficile detected.  Final  MRSA PCR Screening     Status: Abnormal   Collection Time: 11/09/16  1:30 AM  Result Value Ref Range Status   MRSA by PCR POSITIVE (A) NEGATIVE Final    Comment:        The GeneXpert MRSA Assay (FDA approved for NASAL specimens only), is one component of a comprehensive MRSA colonization surveillance program. It is not intended to diagnose MRSA infection nor to guide or monitor treatment for MRSA infections. RESULT CALLED TO, READ BACK BY AND VERIFIED WITH: WAGNOR,R AT 0330 ON 6.8.2018 BY ISLEY,B    Medical History: Past Medical History:  Diagnosis Date  . Anxiety   . Chronic pain    legs, back; MRI 05/2012 with mild thoracic degenerative changes no spinal stenosis   . COPD (chronic obstructive pulmonary disease) ([place])   . Depression   . Diabetic peripheral neuropathy ([place])    "chronic" (05/27/2012)  . Diastolic CHF ([place])   . Hypercholesteremia   . Hypertension   . Peripheral edema   . PVD (peripheral vascular disease) ([place])   . Spinal stenosis  mild lumbar (MRI 05/2012)-L2-L3 to L4-L5 , mild lumbar foraminal stenosis   . Stroke ([place]) 05/2012   Subacute, lacunar infarcts within the left basal ganglia and posterior limp of the left internal capsule/thalamus; "RUE; both feet weak" (05/27/2012)  . Tremor   . Type II diabetes mellitus ([name])    Medications:  Prescriptions Prior to Admission  Medication Sig Dispense Refill Last Dose  . albuterol (PROVENTIL) (2.5 MG/3ML) 0.083% nebulizer solution Take 2.5 mg by nebulization every 3 (three) hours as needed for wheezing or shortness of breath.   Taking  . ALPRAZolam (XANAX) 1 MG tablet Take 1 tablet (1 mg total)  by mouth 3 (three) times daily. 30 tablet 0 Taking  . amLODipine (NORVASC) 10 MG tablet Take 10 mg by mouth daily.   Taking  . aspirin EC 81 MG tablet Take 81 mg by mouth daily.   Taking  . atorvastatin (LIPITOR) 20 MG tablet Take 20 mg by mouth daily.    Taking  . BREO ELLIPTA 100-25 MCG/INH AEPB    Taking  . calcium-vitamin D (OSCAL WITH D) 500-200 MG-UNIT per tablet Take 1 tablet by mouth 3 (three) times daily.   Taking  . citalopram (CELEXA) 20 MG tablet Take 20 mg by mouth daily.   Taking  . fish oil-omega-3 fatty acids 1000 MG capsule Take 1 g by mouth 2 (two) times daily.   Taking  . Fluticasone-Salmeterol (ADVAIR) 250-50 MCG/DOSE AEPB Inhale 1 puff into the lungs every 12 (twelve) hours.   Taking  . furosemide (LASIX) 20 MG tablet Take 40 mg by mouth daily.    Taking  . furosemide (LASIX) 40 MG tablet    Taking  . gabapentin (NEURONTIN) 600 MG tablet    Taking  . gabapentin (NEURONTIN) 800 MG tablet Take 800 mg by mouth 3 (three) times daily.   Taking  . glimepiride (AMARYL) 2 MG tablet Take 2 mg by mouth daily with breakfast.   Taking  . HUMALOG KWIKPEN 100 UNIT/ML KiwkPen    Taking  . insulin glargine (LANTUS) 100 UNIT/ML injection Inject 60 Units into the skin at bedtime.   Taking  . insulin lispro (HUMALOG) 100 UNIT/ML injection Inject into the skin 3 (three) times daily before meals.   Taking  . ipratropium (ATROVENT) 0.02 % nebulizer solution Take 0.5 mg by nebulization every 6 (six) hours as needed for wheezing or shortness of breath.   Taking  . ipratropium-albuterol (DUONEB) 0.5-2.5 (3) MG/3ML SOLN    Taking  . LANTUS SOLOSTAR 100 UNIT/ML Solostar Pen    Taking  . lisinopril (PRINIVIL,ZESTRIL) 5 MG tablet Take 5 mg by mouth daily.   Taking  . metFORMIN (GLUCOPHAGE) 1000 MG tablet Take 1,000 mg by mouth 2 (two) times daily with a meal.    Not Taking  . morphine (MS CONTIN) 15 MG 12 hr tablet    Taking  . oxyCODONE (OXY IR/ROXICODONE) 5 MG immediate release tablet    Taking  .  oxyCODONE-acetaminophen (PERCOCET) 10-325 MG tablet Take 1 tablet by mouth every 8 (eight) hours as needed for pain (moderate). 30 tablet 0 Taking  . polyethylene glycol (MIRALAX) packet Take 17 g by mouth daily. (Patient taking differently: Take 17 g by mouth daily as needed for mild constipation or moderate constipation. ) 14 each 0 Taking  . SANTYL ointment    Taking  . tiotropium (SPIRIVA) 18 MCG inhalation capsule Place 18 mcg into inhaler and inhale daily.   Taking  . triamcinolone cream (KENALOG) 0.1 %  Apply 1 application topically daily. Apply to affected area of bilateral lower legs once daily during dressing change.   Taking  . vancomycin (VANCOCIN) 1-5 GM/200ML-% SOLN    Taking   Scheduled:  . Chlorhexidine Gluconate Cloth  6 each Topical Q0600  . enoxaparin (LOVENOX) injection  0.5 mg/kg Subcutaneous Q24H  . insulin aspart  0-9 Units Subcutaneous TID WC  . mupirocin ointment  1 application Nasal BID   Infusions:  . sodium chloride 75 mL/hr at 11/09/16 0323  . piperacillin-tazobactam (ZOSYN)  IV Stopped (11/09/16 0939)  . vancomycin     PRN: acetaminophen **OR** acetaminophen, ondansetron **OR** ondansetron (ZOFRAN) IV Anti-infectives    Start     Dose/Rate Route Frequency Ordered Stop   11/09/16 1600  vancomycin (VANCOCIN) IVPB 1000 mg/200 mL premix     1,000 mg 200 mL/hr over 60 Minutes Intravenous Every 12 hours 11/09/16 0747     11/09/16 0500  piperacillin-tazobactam (ZOSYN) IVPB 3.375 g     3.375 g 12.5 mL/hr over 240 Minutes Intravenous Every 8 hours 11/09/16 0215     11/09/16 0230  vancomycin (VANCOCIN) IVPB 1000 mg/200 mL premix     1,000 mg 200 mL/hr over 60 Minutes Intravenous Every 1 hr x 2 11/09/16 0215 11/09/16 0543   11/08/16 2045  piperacillin-tazobactam (ZOSYN) IVPB 3.375 g     3.375 g 100 mL/hr over 30 Minutes Intravenous  Once 11/08/16 2033 11/08/16 2130     Assessment: 66 yo male in ED with AMS. Has bilateral lower extremity infections.  Was started  on vancomycin 500mg  Q12 hours at outside facility on 6/7.  Has likely received 2 doses thus far.  Too soon for level, doses are low for patient's weight and CrCl.  Will proceed with loading dose of 2 grams vancomycin now to achieve therapeutic level.  Goal of Therapy:  Vancomycin trough level 15-20 mcg/ml  Plan:  Vancomycin 1000mg  IV q12h Check trough at steady state Zosyn 3.375gm IV q8h, EID Monitor labs, renal fxn, progress and c/s Deescalate ABX when improved / appropriate.    Hart Robinsons A, RPH 11/09/2016,7:47 AM

## 2016-11-09 NOTE — Progress Notes (Signed)
PROGRESS NOTE  Eric Scott ZOX:[id number] DOB: 08/31/1950 DOA: 11/08/2016 PCP: [name], MD  Brief Narrative: 66 year old man PMH diabetes mellitus, chronic bilateral lower extremity wounds, peripheral vascular disease, presented from skilled nursing facility with fever and altered mental status after a fall. Reportedly was started on vancomycin at the skilled nursing facility. Admitted for sepsis, thought secondary to wounds.  Assessment/Plan #1: Sepsis suspect secondary to acute osteomyelitis of the right fifth toe, possible osteomyelitis of the left calcaneus. Urinalysis also grossly abnormal, cannot exclude UTI. -Continue empiric antibiotics. Condition appears to be improving. -Message left with patient's primary orthopedic surgeon Dr. Sharol Given to discuss further management.  #2: Acute hypoxic respiratory failure. Chest x-ray no acute disease. Likely related to underlying COPD. -Wean oxygen as tolerated.  #3: Bilateral vascular wounds lower extremities secondary to idiopathic chronic venous hypertension and arterial insufficiency. Seen by orthopedics 5/14 with recommendation to keep wounds wrapped and stop smoking. Plan was for follow-up visit 11/05/2016. Seen by vascular surgery 5/11: Left lower x-ray critical limb ischemia, venous stasis ulcers right foot. Noted to have contacted serial flow both feet and chronic venous changes bilaterally. He was referred to orthopedics. -Wound care consult  #4: Acute encephalopathy, resolved.  #5: Abnormal urinalysis. Culture pending. Continue empiric antibiotics.  #6: Diabetes mellitus with peripheral neuropathy, peripheral vascular disease. -Blood sugars stable. -Resume insulin, gabapentin  #7: COPD appears stable. -Resume bronchodilators #8: Chronic Diastolic CHF appears stable. -Resume Lasix #9: Aortic atherosclerosis Wheelchair-bound Smoker Normocytic anemia. Follow clinically.   Overall appears to have improved somewhat. Remains  stepdown given soft blood pressure. Continue empiric biotics, follow-up orthopedic recommendations. Replace potassium. Check CBC in a.m.  DVT prophylaxis: enoxaparin Code Status: full Family Communication: none Disposition Plan: return to SNF    Murray Hodgkins, MD  [facility] Direct contact: [phone] --Via amion app OR  --[email or web address]; password [email or web address]  7PM-7AM contact night coverage as above 11/09/2016, 1:33 PM  LOS: 1 day   Consultants:  Wound care  Procedures: PICC line present on admission  Antimicrobials:  Zosyn 6/7 >>  Vancomycin 6/7 >>  Interval history/Subjective: Feels ok.  Objective: Vitals: Maximum temperature 101.8. Respiratory rate 11, pulse 73, blood pressure 100/63. SPO2 94% on room air.  Exam:     Constitutional: appears calm and comfortable  Eyes: pupils, irises lids appear unremarkable  ENT: grossly normal hearing, lips and tonue  CV: RRR no m/r/g. No significant LE edema.  Respiratory: CTA bilaterally, no w/r/r. Normal respiratory effort.  Abd: soft, ntnd  Skin:    I have personally reviewed the following:   Labs:  Potassium 3.2, BUN without change 23, creatinine improved 1.29.  Lactic acid within normal limits  WBC improved, 10.8  Hemoglobin down, 9.8. Platelets normal.  Urinalysis grossly abnormal C. difficile screen negative  Blood cultures and urine culture pending  Imaging studies:  Left foot x-ray without evidence of osteomyelitis   Right foot x-ray with diffuse soft tissue swelling of the ankle and foot with bony destructive changes right fifth proximal phalanx concerning for chronic osteomyelitis   CT head no acute intracranial process. Possible acute sinusitis.   MRI right foot acute osteomyelitis fifth metatarsal head and fifth proximal phalanx MRI left foot soft tissue wound over the calcaneus, possible mild osteomyelitis no drainable fluid collection to suggest abscess.  Medical tests:  Sinus  rhythm, no acute changes   Test discussed with performing physician:    Decision to obtain old records:    Review and summation  Echocardiogram November 2015:  LVEF 60-65%. Normal wall motion. Grade 2 diastolic dysfunction. of old records:  Seen by orthopedics 5/14 with recommendation to keep wounds wrapped and stop smoking. Plan was for follow-up visit 11/05/2016.  Seen by vascular surgery 5/11: Left lower x-ray critical limb ischemia, venous stasis ulcers right foot. Noted to have contacted serial flow both feet and chronic venous changes bilaterally. He was referred to orthopedics.  Scheduled Meds: . ALPRAZolam  1 mg Oral TID  . Chlorhexidine Gluconate Cloth  6 each Topical Q0600  . citalopram  20 mg Oral Daily  . enoxaparin (LOVENOX) injection  0.5 mg/kg Subcutaneous Q24H  . fluticasone furoate-vilanterol  1 puff Inhalation Daily  . furosemide  80 mg Oral Daily  . gabapentin  800 mg Oral TID  . insulin aspart  0-9 Units Subcutaneous TID WC  . insulin glargine  24 Units Subcutaneous QHS  . ipratropium-albuterol  3 mL Inhalation TID  . morphine  15 mg Oral Q8H  . mupirocin ointment  1 application Nasal BID   Continuous Infusions: . piperacillin-tazobactam (ZOSYN)  IV 3.375 g (11/09/16 1200)  . vancomycin      Active Problems:   DM type 2 (diabetes mellitus, type 2) ([name])   Acute hypoxemic respiratory failure ([name])   Chronic diastolic CHF (congestive heart failure) ([name])   Diabetic peripheral neuropathy ([name])   PVD (peripheral vascular disease) ([name])   Chronic venous insufficiency   Wounds, multiple   Pressure injury of skin   Sepsis ([place])   Osteomyelitis ([place])   LOS: 1 day

## 2016-11-10 DIAGNOSIS — E1140 Type 2 diabetes mellitus with diabetic neuropathy, unspecified: Secondary | ICD-10-CM

## 2016-11-10 DIAGNOSIS — I5032 Chronic diastolic (congestive) heart failure: Secondary | ICD-10-CM

## 2016-11-10 LAB — GLUCOSE, CAPILLARY
GLUCOSE-CAPILLARY: 151 mg/dL — AB (ref 65–99)
Glucose-Capillary: 131 mg/dL — ABNORMAL HIGH (ref 65–99)
Glucose-Capillary: 163 mg/dL — ABNORMAL HIGH (ref 65–99)
Glucose-Capillary: 156 mg/dL — ABNORMAL HIGH (ref 65–99)
Glucose-Capillary: 114 mg/dL — ABNORMAL HIGH (ref 65–99)

## 2016-11-10 LAB — BASIC METABOLIC PANEL
Anion gap: 8 (ref 5–15)
BUN: 17 mg/dL (ref 6–20)
CALCIUM: 7.7 mg/dL — AB (ref 8.9–10.3)
CO2: 27 mmol/L (ref 22–32)
Chloride: 104 mmol/L (ref 101–111)
Creatinine, Ser: 1.06 mg/dL (ref 0.61–1.24)
GFR calc Af Amer: >60 mL/min (ref >60)
GLUCOSE: 144 mg/dL — AB (ref 65–99)
POTASSIUM: 3.3 mmol/L — AB (ref 3.5–5.1)
Sodium: 139 mmol/L (ref 135–145)

## 2016-11-10 NOTE — Progress Notes (Signed)
Patient resting in bed. Vital signs are stable. Dressing to bilateral feet clean dry and intact. PICC line patent. O2 on 2L/[place]. No complaints of any distress. Report given to Katharine Look, [place]. Patient transferred via bed to room 309.

## 2016-11-10 NOTE — Progress Notes (Signed)
PROGRESS NOTE  Eric Scott MOL:[id number] DOB: 1951/05/09 DOA: 11/08/2016 PCP: [name], MD  Brief Narrative: 66 year old man PMH diabetes mellitus, chronic bilateral lower extremity wounds, peripheral vascular disease, presented from skilled nursing facility with fever and altered mental status after a fall. Reportedly was started on vancomycin at the skilled nursing facility. Admitted for sepsis, thought secondary to wounds.  Assessment/Plan #1: Sepsis suspect secondary to acute osteomyelitis of the right fifth toe, possible acute versus chronic osteomyelitis of the left calcaneus. Blood and urine cultures pending -Hemodynamics have improved. Clinically appears improved. -Discussed with patient's primary orthopedist Dr. Sharol Given who reviewed the case and MRI findings. He recommends continuing antibiotics and follow-up in his office next week for arrangement of elective surgery. No indication for transfer at this time.  -Continue empiric antibiotics. Follow culture data.  #2: Acute hypoxic respiratory failure. Chest x-ray no acute disease. Likely related to underlying COPD. -Appears stable. Wean oxygen as tolerated.   #3: Bilateral vascular wounds lower extremities secondary to idiopathic chronic venous hypertension and arterial insufficiency.  -Plan as above  #4: Acute encephalopathy, resolved.  #5: Abnormal urinalysis. -Follow-up culture. Continue empiric antibiotics for osteomyelitis.   #6: Diabetes mellitus with peripheral neuropathy, peripheral vascular disease. -Blood sugars remain stable. Continue insulin, gabapentin.   #7: COPD appears stable. - stable. Continue bronchodilators   #8: Chronic diastolic CHF - stable. Continue diuretic.   #9: Aortic atherosclerosis Wheelchair-bound Smoker Normocytic anemia. Follow clinically.   Appears to be improving. Continue IV antibiotics today. Transfer to medical floor. If continues to improve, anticipate transfer back to  skilled nursing facility 6/10 with close outpatient follow-up.   DVT prophylaxis: enoxaparin Code Status: full Family Communication: none Disposition Plan: return to SNF    Murray Hodgkins, MD  [facility] Direct contact: [phone] --Via amion app OR  --[email or web address]; password [email or web address]  7PM-7AM contact night coverage as above 11/10/2016, 9:25 AM  LOS: 2 days   Consultants:  Wound care  Procedures: PICC line present on admission  Antimicrobials:  Zosyn 6/7 >>  Vancomycin 6/7 >>  Interval history/Subjective: Poor sleep and appetite but otherwise feeling okay. Does have pain in his feet.  Objective: Vitals: Afebrile 24 hours. Temperature 97.6. Respirations 19, pulse 72, blood pressure 95/50  Exam:     Constitutional: Appears calm, comfortable sitting up in bed.  Eyes: Pupils, irises, lids appear normal.  ENT grossly normal hearing, lips and tongue  Cardiovascular regular rate and rhythm. No murmur, rub or gallop. 1+ bilateral lower extremity edema.  Respiratory clear to auscultation bilaterally. No wheezes, rales or rhonchi. Normal respiratory effort.  Psychiatric. Grossly normal mood and affect. Speech fluent and appropriate.  Skin: Bilateral wounds of the feet dressed. Chronic venous stasis changes bilateral lower extremities.   I have personally reviewed the following:   Labs:  Blood sugars stable.  Potassium 3.3, creatinine has normalized 1.06. Remainder BMP unremarkable.  Blood and urine cultures pending  Imaging studies:     Medical tests:    Test discussed with performing physician:    Decision to obtain old records:    Review and summation  Echocardiogram November 2015: LVEF 60-65%. Normal wall motion. Grade 2 diastolic dysfunction. of old records:  Seen by orthopedics 5/14 with recommendation to keep wounds wrapped and stop smoking. Plan was for follow-up visit 11/05/2016.  Seen by vascular surgery 5/11: Left lower x-ray  critical limb ischemia, venous stasis ulcers right foot. Noted to have contacted serial flow both feet and chronic venous changes bilaterally. He  was referred to orthopedics.  Scheduled Meds: . ALPRAZolam  1 mg Oral TID  . Chlorhexidine Gluconate Cloth  6 each Topical Q0600  . citalopram  20 mg Oral Daily  . collagenase   Topical Daily  . enoxaparin (LOVENOX) injection  0.5 mg/kg Subcutaneous Q24H  . fluticasone furoate-vilanterol  1 puff Inhalation Daily  . furosemide  80 mg Oral Daily  . gabapentin  800 mg Oral TID  . insulin aspart  0-9 Units Subcutaneous TID WC  . morphine  15 mg Oral Q8H  . mupirocin ointment  1 application Nasal BID   Continuous Infusions: . piperacillin-tazobactam (ZOSYN)  IV Stopped (11/10/16 0825)  . vancomycin Stopped (11/10/16 0525)    Principal Problem:   Sepsis ([place]) Active Problems:   DM type 2 (diabetes mellitus, type 2) ([place])   Acute hypoxemic respiratory failure ([name])   Chronic diastolic CHF (congestive heart failure) ([name])   Diabetic peripheral neuropathy ([name])   PVD (peripheral vascular disease) ([name])   Chronic venous insufficiency   Wounds, multiple   Pressure injury of skin   Osteomyelitis ([place])   LOS: 2 days

## 2016-11-11 LAB — BASIC METABOLIC PANEL
ANION GAP: 8 (ref 5–15)
BUN: 11 mg/dL (ref 6–20)
CALCIUM: 7.9 mg/dL — AB (ref 8.9–10.3)
CHLORIDE: 102 mmol/L (ref 101–111)
CO2: 30 mmol/L (ref 22–32)
Creatinine, Ser: 1.15 mg/dL (ref 0.61–1.24)
GFR calc Af Amer: >60 mL/min (ref >60)
GFR calc non Af Amer: >60 mL/min (ref >60)
Glucose, Bld: 123 mg/dL — ABNORMAL HIGH (ref 65–99)
Potassium: 3.2 mmol/L — ABNORMAL LOW (ref 3.5–5.1)
Sodium: 140 mmol/L (ref 135–145)

## 2016-11-11 LAB — URINE CULTURE

## 2016-11-11 LAB — VANCOMYCIN, TROUGH: Vancomycin Tr: 27 ug/mL (ref 15–20)

## 2016-11-11 LAB — CBC
HEMATOCRIT: 31 % — AB (ref 39.0–52.0)
Hemoglobin: 10.1 g/dL — ABNORMAL LOW (ref 13.0–17.0)
MCH: 33.2 pg (ref 26.0–34.0)
MCHC: 32.6 g/dL (ref 30.0–36.0)
MCV: 102 fL — AB (ref 78.0–100.0)
Platelets: 241 10*3/uL (ref 150–400)
RBC: 3.04 MIL/uL — AB (ref 4.22–5.81)
RDW: 16.4 % — ABNORMAL HIGH (ref 11.5–15.5)
WBC: 7.3 10*3/uL (ref 4.0–10.5)

## 2016-11-11 LAB — GLUCOSE, CAPILLARY
GLUCOSE-CAPILLARY: 115 mg/dL — AB (ref 65–99)
Glucose-Capillary: 153 mg/dL — ABNORMAL HIGH (ref 65–99)
Glucose-Capillary: 95 mg/dL (ref 65–99)

## 2016-11-11 LAB — PROCALCITONIN: Procalcitonin: 0.21 ng/mL

## 2016-11-11 MED ORDER — COLLAGENASE 250 UNIT/GM EX OINT: TOPICAL_OINTMENT | Freq: Every day | CUTANEOUS | Status: DC

## 2016-11-11 MED ORDER — VANCOMYCIN HCL IN DEXTROSE 1-5 GM/200ML-% IV SOLN: 1000.0000 mg | Freq: Two times a day (BID) | INTRAVENOUS | Status: DC

## 2016-11-11 MED ORDER — OXYCODONE HCL 5 MG PO TABS: 5.0000 mg | ORAL_TABLET | Freq: Three times a day (TID) | ORAL | 0 refills | Status: DC | PRN

## 2016-11-11 MED ORDER — DEXTROSE 5 % IV SOLN
2.0000 g | Freq: Two times a day (BID) | INTRAVENOUS | Status: DC
  Filled 2016-11-11 (×3): qty 2

## 2016-11-11 MED ORDER — MORPHINE SULFATE ER 15 MG PO TBCR: 15.0000 mg | EXTENDED_RELEASE_TABLET | Freq: Three times a day (TID) | ORAL | 0 refills | Status: DC

## 2016-11-11 MED ORDER — VANCOMYCIN HCL IN DEXTROSE 750-5 MG/150ML-% IV SOLN
750.0000 mg | Freq: Two times a day (BID) | INTRAVENOUS | Status: DC
  Filled 2016-11-11: qty 150

## 2016-11-11 MED ORDER — MAGNESIUM OXIDE 400 (241.3 MG) MG PO TABS
400.0000 mg | ORAL_TABLET | Freq: Two times a day (BID) | ORAL | Status: DC
  Administered 2016-11-11: 400 mg via ORAL
  Filled 2016-11-11: qty 1

## 2016-11-11 MED ORDER — VANCOMYCIN HCL IN DEXTROSE 1-5 GM/200ML-% IV SOLN: 750.0000 mg | Freq: Two times a day (BID) | INTRAVENOUS | Status: DC

## 2016-11-11 MED ORDER — DEXTROSE 5 % IV SOLN: 2.0000 g | Freq: Two times a day (BID) | INTRAVENOUS | Status: DC

## 2016-11-11 MED ORDER — POTASSIUM CHLORIDE CRYS ER 20 MEQ PO TBCR
40.0000 meq | EXTENDED_RELEASE_TABLET | Freq: Three times a day (TID) | ORAL | Status: DC
  Administered 2016-11-11 (×2): 40 meq via ORAL
  Filled 2016-11-11 (×2): qty 2

## 2016-11-11 NOTE — Progress Notes (Signed)
Vancomycin level was elevated at 27.  Dr. Sarajane Jews changed dose of Vanc from 1000mg  to 750mg , a new AVS was printed and the changes called to [facility].  Report was called to [facility].

## 2016-11-11 NOTE — Progress Notes (Addendum)
Pharmacy Antibiotic Note  Eric Scott is a 66 y.o. male admitted on 11/08/2016 with osteomyletis .  Pharmacy has been consulted for Cefepime and Vancomycin dosing. Vancomycin trough above goal  Plan: Change Vancomycin to 750 mg IV every 12 hours, next dose 11/12/16 at 8 AM Vancomycin trough at steady state Cefepime 2 GM IV every 12 hours Labs per protocol  Height: 6' (182.9 cm) Weight: 282 lb 13.6 oz (128.3 kg) IBW/kg (Calculated) : 77.6  Temp (24hrs), Avg:97.7 F (36.5 C), Min:97.6 F (36.4 C), Max:97.8 F (36.6 C)   Recent Labs Lab 11/08/16 2035 11/09/16 0057 11/09/16 0340 11/10/16 0516 11/11/16 0518  WBC 19.7*  --  10.8*  --  7.3  CREATININE 1.43*  --  1.29* 1.06 1.15  LATICACIDVEN 1.6 1.1 0.6  --   --     Estimated Creatinine Clearance: 87.5 mL/min (by C-G formula based on SCr of 1.15 mg/dL).    Allergies  Allergen Reactions  . Blueberry Flavor   . Cucumber Extract Other (See Comments)    "Fells like I'm having a heart attack"  . Flexeril [Cyclobenzaprine Hcl] Other (See Comments)    "whole body  Tremors" (05/27/2012)  . Kiwi Extract Other (See Comments)    "feels like I'm having a heart attack"    Antimicrobials this admission: Cefepime 6/10 >>  Vancomycin 6/8 >>  Zosyn 6/8 >> 6/10  Dose adjustments this admission:     Thank you for allowing pharmacy to be a part of this patient's care.  Chriss Czar 11/11/2016 11:31 AM

## 2016-11-11 NOTE — Discharge Summary (Addendum)
Physician Discharge Summary  Eric Scott YKD:[id number] DOB: 1950-10-01 DOA: 11/08/2016  PCP: [name], MD  Admit date: 11/08/2016 Discharge date: 11/11/2016  Recommendations for Outpatient Follow-up:  1. 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 Close outpatient follow-up with Dr. Sharol Given within the next week for consideration of surgery. His office will contact the surgery to coordinate an appointment. 2. Vancomycin next dose 11/12/16 at 8 AM 3. Continue empiric vancomycin and cefepime without stop date at this point, pending orthopedic treatment. Treatment to continue until definitive management by orthopedics. Follow laboratory monitoring and dosage adjustment protocols per pharmacy for patient safety. 4. Diabetes mellitus. While in the hospital, blood sugars have been very well controlled with sliding scale insulin. Per report, on Lantus 24 units at bedtime prior to admission. Despite not receiving Lantus during this hospitalization, blood sugars well controlled. Lantus and meal coverage on hold on discharge but could be restarted depending on patient's blood sugar trends. 5. Recommend sliding scale insulin QAC, QHS.   Follow-up Information    Eric Minion, MD Follow up.   Specialty:  Orthopedic Surgery Why:  Office will contact you for appointment Contact information: [address] [place] [address] [phone]           Follow-up Information    Eric Minion, MD Follow up.   Specialty:  Orthopedic Surgery Why:  Office will contact you for appointment Contact information: [address] [place] [address] [phone]             Discharge Diagnoses:  1. Sepsis secondary to acute osteomyelitis of the right fifth toe 2. 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 Acute hypoxic respiratory failure 4. 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 Acute encephalopathy 6. Enterococcus UTI 7. Diabetes mellitus type 2 with peripheral neuropathy 8. COPD 9. Aortic atherosclerosis 10. Chronic diastolic congestive heart failure 11. Macrocytic anemia  Discharge Condition: improved Disposition: Returned to SNF  Diet recommendation: heart healthy, diabetic diet  Filed Weights   11/09/16 0135 11/09/16 0500 11/10/16 0400  Weight: 127.1 kg (280 lb 3.3 oz) 127.1 kg (280 lb 3.3 oz) 128.3 kg (282 lb 13.6 oz)    History of present illness:  66 year old man PMH diabetes mellitus, chronic bilateral lower extremity wounds, peripheral vascular disease, presented from skilled nursing facility with fever and altered mental status after a fall. Reportedly was started on vancomycin at the skilled nursing facility. Admitted for sepsis, thought secondary to wounds.  Hospital Course:  Patient was treated with empiric antibiotics with gradual improvement and resolution of sepsis symptomatology and acute encephalopathy.  After further imaging and evaluation, source located--acute osteomyelitis of the right fifth toe, possibly also osteomyelitis of the left calcaneus. In discussion with orthopedics, plan was to continue IV antibiotics and follow-up in the outpatient setting in the near future for scheduling of elective surgery. Individual issues as below.  1: Sepsis secondary to acute osteomyelitis of the right fifth toe, as well as acute versus chronic osteomyelitis left calcaneus. -Hemodynamics remained stable, remains afebrile -Discussed in detail with the patient's primary orthopedic surgeon Dr. Sharol Given who reviewed the case and MRI findings. He recommended continuing IV antibiotics and follow-up in his office next week for arrangement of elective surgery. He did not feel that inpatient consultation, transfer surgery was warranted. -Follow-up culture data.  2: Acute hypoxic respiratory failure, resolved. Chest x-ray  no acute disease. Suspect  related to underlying COPD.  3: Bilateral vascular wounds of the lower extremities secondary to idiopathic chronic venous hypertension and arterial insufficiency. Followed in the outpatient setting by vascular surgery and orthopedic surgery.  4: Acute encephalopathy secondary to sepsis, resolved  5: Enterococcus in the urine, possible UTI. Sensitive to vancomycin. Continue vancomycin.  6: Diabetes mellitus with peripheral neuropathy, peripheral vascular disease. -Blood sugars have been stable. Continue insulin gabapentin.  7: COPD. Stable. Continue bronchodilators.  8: Chronic diastolic CHF remained stable.  9: Aortic atherosclerosis. Asymptomatic. Follow-up as an outpatient.  Consultants:  Wound care  Procedures: PICC line present on admission  Antimicrobials:  Zosyn 6/7 >> 6/10  Cefepime 6/10 >>  Vancomycin 6/7 >>  Today's assessment: S: feels ok, slept better, has some pain in feet. Eating. O: Vitals:  Afebrile greater than 48 hours. Temperature 97.8, respirations 18, pulse 66, blood pressure 112/62, SPO2 95% on room air   Constitutional: Appears calm, comfortable.  Cardiovascular: Regular rate and rhythm. No murmur, rub or gallop.  Respiratory: Clear to auscultation bilaterally. No wheezes, rales or rhonchi. Normal respiratory effort.  Psychiatric: Grossly normal mood and affect. Speech fluent and appropriate.  Discharge Instructions   Allergies as of 11/11/2016      Reactions   Blueberry Flavor    Cucumber Extract Other (See Comments)   "Fells like I'm having a heart attack"   Flexeril [cyclobenzaprine Hcl] Other (See Comments)   "whole body  Tremors" (05/27/2012)   Kiwi Extract Other (See Comments)   "feels like I'm having a heart attack"      Medication List    STOP taking these medications   insulin glargine 100 UNIT/ML injection Commonly known as:  LANTUS     TAKE these medications   albuterol (2.5 MG/3ML) 0.083%  nebulizer solution Commonly known as:  PROVENTIL Take 2.5 mg by nebulization every 3 (three) hours as needed for wheezing or shortness of breath.   ALPRAZolam 1 MG tablet Commonly known as:  XANAX Take 1 tablet (1 mg total) by mouth 3 (three) times daily.   atorvastatin 20 MG tablet Commonly known as:  LIPITOR Take 20 mg by mouth 2 (two) times daily.   BREO ELLIPTA 100-25 MCG/INH Aepb Generic drug:  fluticasone furoate-vilanterol   ceFEPIme 2 g in dextrose 5 % 50 mL Inject 2 g into the vein every 12 (twelve) hours. Monitor labs and make dose adjustments per pharmacy protocol and input.   citalopram 20 MG tablet Commonly known as:  CELEXA Take 20 mg by mouth daily.   collagenase ointment Commonly known as:  SANTYL Apply topically daily. Apply Santyl to left heel and right outer foot Q day, then cover with moist gauze, then kerlex and tape.   fish oil-omega-3 fatty acids 1000 MG capsule Take 1 g by mouth 2 (two) times daily.   furosemide 80 MG tablet Commonly known as:  LASIX Take 80 mg by mouth daily.   gabapentin 800 MG tablet Commonly known as:  NEURONTIN Take 800 mg by mouth 3 (three) times daily.   HUMALOG KWIKPEN 100 UNIT/ML KiwkPen Generic drug:  insulin lispro Per sliding scale What changed:  Another medication with the same name was removed. Continue taking this medication, and follow the directions you see here.   ipratropium 0.02 % nebulizer solution Commonly known as:  ATROVENT Take 0.5 mg by nebulization every 6 (six) hours as needed for wheezing or shortness of breath.   ipratropium-albuterol 0.5-2.5 (3) MG/3ML Soln Commonly known as:  DUONEB Inhale  3 mLs into the lungs 3 (three) times daily.   l-methylfolate-B6-B12 3-35-2 MG Tabs tablet Commonly known as:  METANX Take 1 tablet by mouth 2 (two) times daily.   lisinopril 5 MG tablet Commonly known as:  PRINIVIL,ZESTRIL Take 5 mg by mouth daily.   morphine 15 MG 12 hr tablet Commonly known as:  MS  CONTIN Take 1 tablet (15 mg total) by mouth every 8 (eight) hours.   oxyCODONE 5 MG immediate release tablet Commonly known as:  Oxy IR/ROXICODONE Take 1 tablet (5 mg total) by mouth every 8 (eight) hours as needed for severe pain.   vancomycin 1-5 GM/200ML-% Soln Commonly known as:  VANCOCIN Inject 150 mLs (750 mg total) into the vein every 12 (twelve) hours. Monitor labs and make dose adjustments per pharmacy protocol and input. Start taking on:  11/12/2016 What changed:  how much to take  how to take this  when to take this  additional instructions   Vitamin D (Ergocalciferol) 50000 units Caps capsule Commonly known as:  DRISDOL Take 50,000 Units by mouth every 30 (thirty) days.   white petrolatum Gel Commonly known as:  VASELINE Apply 1 application topically 2 (two) times daily.      Allergies  Allergen Reactions  . Blueberry Flavor   . Cucumber Extract Other (See Comments)    "Fells like I'm having a heart attack"  . Flexeril [Cyclobenzaprine Hcl] Other (See Comments)    "whole body  Tremors" (05/27/2012)  . Kiwi Extract Other (See Comments)    "feels like I'm having a heart attack"    The results of significant diagnostics from this hospitalization (including imaging, microbiology, ancillary and laboratory) are listed below for reference.    Significant Diagnostic Studies: Ct Head Wo Contrast  Result Date: 11/08/2016 CLINICAL DATA:  Altered mental status.  Patient status post fall. EXAM: CT HEAD WITHOUT CONTRAST TECHNIQUE: Contiguous axial images were obtained from the base of the skull through the vertex without intravenous contrast. COMPARISON:  Brain CT 05/29/2016. FINDINGS: Brain: Periventricular and subcortical white matter hypodensity compatible with chronic microvascular ischemic changes. Old left basal ganglia lacunar infarct. Ventricles and sulci are prominent compatible with atrophy. No evidence for acute cortically based infarct, intracranial hemorrhage,  mass lesion or mass-effect. Vascular: Unremarkable Skull: Intact. Sinuses/Orbits: Air-fluid levels within the maxillary sinuses bilaterally. Ethmoid air cell mucosal thickening. Orbits are unremarkable. Other: None. IMPRESSION: No acute intracranial process. 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 Air-fluid levels within the maxillary sinuses as can be seen with acute sinusitis in the appropriate clinical setting. Electronically Signed   By: Lovey Newcomer M.D.   On: 11/08/2016 21:35   Mr Foot Right Wo Contrast  Result Date: 11/09/2016 CLINICAL DATA:  Medical history significant of chronic wounds to ble, PVD, venous insufficiency Pt started antibiotics yesterday- DM, evaluate for osteomylitis Best obtainable images due to pt condition- no repeats tolerated EXAM: MRI OF THE RIGHT FOREFOOT WITHOUT CONTRAST TECHNIQUE: Multiplanar, multisequence MR imaging of the right forefoot was performed. No intravenous contrast was administered. COMPARISON:  None. FINDINGS: Bones/Joint/Cartilage Soft tissue wound along the lateral aspect of the forefoot overlying the fifth metatarsal head. Severe bone marrow edema with cortical destruction of the fifth metatarsal head and fifth proximal phalanx. Severe osteoarthritis of the first MTP joint. Severe osteoarthritis of the talonavicular joint with subchondral reactive marrow edema. Moderate osteoarthritis of the subtalar joints. Normal alignment. No joint effusion. Ligaments Collateral ligaments are intact.  Intact Lisfranc ligament.  Muscles and Tendons Generalized muscle atrophy. Flexor, extensor, peroneal and Achilles tendons are intact. Soft tissue No fluid collection or hematoma.  No soft tissue mass. IMPRESSION: 1. Soft tissue wound along the lateral aspect of the fourth this overlying the fifth metatarsal head. Acute osteomyelitis of the fifth metatarsal head and fifth proximal phalanx. 2. Severe osteoarthritis of the  first MTP joint. 3. Severe osteoarthritis of the talonavicular joint with subchondral reactive marrow edema. 4. Moderate osteoarthritis of the subtalar joints. Electronically Signed   By: Kathreen Devoid   On: 11/09/2016 11:05   Mr Foot Left Wo Contrast  Result Date: 11/09/2016 CLINICAL DATA:  Medical history significant of chronic wounds to bilateral lower extremities, PVD, venous insufficiency Pt started antibiotics yesterday. History of DM. Evaluate for osteomyelitis. EXAM: MRI OF THE LEFT FOOT WITHOUT CONTRAST TECHNIQUE: Multiplanar, multisequence MR imaging of the left forefoot was performed. No intravenous contrast was administered. COMPARISON:  None. FINDINGS: Significant patient motion degrading image quality limiting evaluation. Bones/Joint/Cartilage No acute fracture or dislocation. Severe osteoarthritis of the first MTP joint with subchondral reactive marrow edema. Mild osteoarthritis of the first IP joint. No significant joint effusion. Soft tissue wound along the plantar aspect of the posterior calcaneus extending to the cortex. Small area of cortical irregularity and underlying marrow edema which may reflect reactive marrow changes versus mild osteomyelitis. No other areas of periosteal reaction or bone destruction. No other marrow signal abnormality. Normal alignment. No joint effusion. Ligaments Collateral ligaments are intact.  Lisfranc ligament is intact. Muscles and Tendons Generalized muscle atrophy. Soft tissue Soft tissue wound along the plantar aspect of the posterior calcaneus. No focal drainable fluid collection. No soft tissue mass. IMPRESSION: 1. Soft tissue wound along the plantar aspect of the posterior calcaneus extending to the cortex. Small area of cortical irregularity and underlying marrow edema which may reflect reactive marrow changes versus mild osteomyelitis. No drainable fluid collection to suggest an abscess. 2. Severe osteoarthritis of the first MTP joint with subchondral  reactive marrow edema. Mild osteoarthritis of the first IP joint. Electronically Signed   By: Kathreen Devoid   On: 11/09/2016 10:54   Dg Chest Portable 1 View  Result Date: 11/08/2016 CLINICAL DATA:  Fever, altered mental status and fall today. Hypoxia. EXAM: PORTABLE CHEST 1 VIEW COMPARISON:  03/04/2015 FINDINGS: Right-sided PICC line tip is noted in the distal SVC. Heart is enlarged but stable with aortic atherosclerosis. Interstitial edema is slightly more pronounced than on prior. No pneumonic consolidation or pneumothorax. No effusion. No acute nor suspicious osseous lesions. IMPRESSION: 1. Stable cardiomegaly with aortic atherosclerosis. 2. Slight interval increase in interstitial pulmonary edema. 3. PICC line tip in the distal SVC. Electronically Signed   By: Ashley Royalty M.D.   On: 11/08/2016 20:46   Dg Foot Complete Left  Result Date: 11/09/2016 CLINICAL DATA:  Lower extremity infection EXAM: LEFT FOOT - COMPLETE 3+ VIEW COMPARISON:  None. FINDINGS: ORIF of the malleoli. Generalized osteopenia with osteoarthritis of the DIP and PIP joints of the toes, interphalangeal joint of the great toe and first MTP. No frank bone destruction to suggest osteomyelitis. No fracture or dislocations. Calcaneal enthesophyte along the plantar aspect. Tibiotalar, subtalar and midfoot osteoarthritis. Moderate diffuse soft tissue swelling about the ankle and foot. IMPRESSION: Osteoarthritis of the foot. Soft tissue swelling of the ankle and foot without underlying findings to suggest osteomyelitis. Electronically Signed   By: Ashley Royalty M.D.   On: 11/09/2016 01:04   Dg Foot Complete Right  Result Date:  11/09/2016 CLINICAL DATA:  Lower extremity infection.  Oozing wounds. EXAM: RIGHT FOOT COMPLETE - 3+ VIEW COMPARISON:  None. FINDINGS: Tapered appearance of the second middle phalangeal head, third distal tuft, fourth proximal head and distal phalangeal tuft as well as fifth proximal phalangeal head in a pattern suggesting  inflammatory arthritis possibly psoriatic. Sclerosis and subtle cortical loss at the base of the right fifth proximal phalanx with bony sequestrum noted laterally consistent with changes of chronic osteomyelitis. Erosive osteoarthritic changes involving the first MTP. Soft tissue swelling noted of the ankle and foot with plantar calcaneal enthesophyte. Subtalar joint osteoarthritis characterized by joint space narrowing sclerosis as well as osteoarthritis of the midfoot articulations are also present. IMPRESSION: 1. Diffuse soft tissue swelling of the ankle and foot with bony destructive changes at the base of the right fifth proximal phalanx. There appears to be bony sequestrum along the lateral aspect of the forefoot. Findings raise concern for changes of chronic osteomyelitis. 2. Tapered appearance of the phalanges as above described which can be seen in an inflammatory arthritis such as psoriasis or rheumatoid. 3. Calcaneal enthesophyte. 4. Osteoarthritis of the mid foot and subtalar joints. Electronically Signed   By: Ashley Royalty M.D.   On: 11/09/2016 01:00    Microbiology: Recent Results (from the past 240 hour(s))  Urine Culture     Status: Abnormal   Collection Time: 11/08/16  8:20 PM  Result Value Ref Range Status   Specimen Description URINE, CATHETERIZED  Final   Special Requests NONE  Final   Culture 20,000 COLONIES/mL ENTEROCOCCUS FAECALIS (A)  Final   Report Status 11/11/2016 FINAL  Final   Organism ID, Bacteria ENTEROCOCCUS FAECALIS (A)  Final      Susceptibility   Enterococcus faecalis - MIC*    AMPICILLIN <=2 SENSITIVE Sensitive     LEVOFLOXACIN >=8 RESISTANT Resistant     NITROFURANTOIN <=16 SENSITIVE Sensitive     VANCOMYCIN 1 SENSITIVE Sensitive     * 20,000 COLONIES/mL ENTEROCOCCUS FAECALIS  Blood culture (routine x 2)     Status: None (Preliminary result)   Collection Time: 11/08/16  8:35 PM  Result Value Ref Range Status   Specimen Description LEFT ANTECUBITAL  Final    Special Requests   Final    BOTTLES DRAWN AEROBIC AND ANAEROBIC Blood Culture results may not be optimal due to an inadequate volume of blood received in culture bottles   Culture NO GROWTH 3 DAYS  Final   Report Status PENDING  Incomplete  Blood culture (routine x 2)     Status: None (Preliminary result)   Collection Time: 11/08/16  8:35 PM  Result Value Ref Range Status   Specimen Description BLOOD LEFT HAND  Final   Special Requests   Final    BOTTLES DRAWN AEROBIC AND ANAEROBIC Blood Culture results may not be optimal due to an inadequate volume of blood received in culture bottles   Culture NO GROWTH 3 DAYS  Final   Report Status PENDING  Incomplete  C difficile quick scan w PCR reflex     Status: None   Collection Time: 11/09/16  1:25 AM  Result Value Ref Range Status   C Diff antigen NEGATIVE NEGATIVE Final   C Diff toxin NEGATIVE NEGATIVE Final   C Diff interpretation No C. difficile detected.  Final  MRSA PCR Screening     Status: Abnormal   Collection Time: 11/09/16  1:30 AM  Result Value Ref Range Status   MRSA by PCR POSITIVE (A)  NEGATIVE Final    Comment:        The GeneXpert MRSA Assay (FDA approved for NASAL specimens only), is one component of a comprehensive MRSA colonization surveillance program. It is not intended to diagnose MRSA infection nor to guide or monitor treatment for MRSA infections. RESULT CALLED TO, READ BACK BY AND VERIFIED WITH: WAGNOR,R AT 0330 ON 6.8.2018 BY ISLEY,B      Labs: Basic Metabolic Panel:  Recent Labs Lab 11/08/16 2035 11/09/16 0340 11/10/16 0516 11/11/16 0518  NA 134* 137 139 140  K 3.6 3.2* 3.3* 3.2*  CL 97* 103 104 102  CO2 27 28 27 30   GLUCOSE 175* 164* 144* 123*  BUN 22* 23* 17 11  CREATININE 1.43* 1.29* 1.06 1.15  CALCIUM 8.1* 7.4* 7.7* 7.9*   Liver Function Tests:  Recent Labs Lab 11/08/16 2035  AST 31  ALT 31  ALKPHOS 98  BILITOT 0.9  PROT 6.8  ALBUMIN 2.6*   CBC:  Recent Labs Lab  11/08/16 2035 11/09/16 0340 11/11/16 0518  WBC 19.7* 10.8* 7.3  NEUTROABS 18.3*  --   --   HGB 12.0* 9.8* 10.1*  HCT 35.9* 29.7* 31.0*  MCV 101.1* 103.1* 102.0*  PLT 281 237 241    CBG:  Recent Labs Lab 11/10/16 1624 11/10/16 2051 11/11/16 0749 11/11/16 1122 11/11/16 1623  GLUCAP 151* 114* 115* 153* 95    Principal Problem:   Sepsis ([name]) Active Problems:   DM type 2 (diabetes mellitus, type 2) ([name])   Acute hypoxemic respiratory failure ([name])   Chronic diastolic CHF (congestive heart failure) ([name])   Diabetic peripheral neuropathy ([name])   PVD (peripheral vascular disease) ([name])   Chronic venous insufficiency   Wounds, multiple   Pressure injury of skin   Osteomyelitis ([place])   Time coordinating discharge: 35 minutes  Signed:  Murray Hodgkins, MD [facility] 11/11/2016, 5:02 PM

## 2016-11-13 LAB — CULTURE, BLOOD (ROUTINE X 2)
CULTURE: NO GROWTH
Culture: NO GROWTH

## 2016-11-15 ENCOUNTER — Ambulatory Visit (INDEPENDENT_AMBULATORY_CARE_PROVIDER_SITE_OTHER): Payer: Medicare Other | Admitting: Orthopedic Surgery

## 2016-11-15 ENCOUNTER — Encounter (INDEPENDENT_AMBULATORY_CARE_PROVIDER_SITE_OTHER): Payer: Self-pay | Admitting: Orthopedic Surgery

## 2016-11-15 VITALS — Ht 72.0 in | Wt 280.0 lb

## 2016-11-15 DIAGNOSIS — L97919 Non-pressure chronic ulcer of unspecified part of right lower leg with unspecified severity: Secondary | ICD-10-CM

## 2016-11-15 DIAGNOSIS — E1142 Type 2 diabetes mellitus with diabetic polyneuropathy: Secondary | ICD-10-CM

## 2016-11-15 DIAGNOSIS — F17200 Nicotine dependence, unspecified, uncomplicated: Secondary | ICD-10-CM

## 2016-11-15 DIAGNOSIS — I87333 Chronic venous hypertension (idiopathic) with ulcer and inflammation of bilateral lower extremity: Secondary | ICD-10-CM | POA: Diagnosis not present

## 2016-11-15 DIAGNOSIS — I739 Peripheral vascular disease, unspecified: Secondary | ICD-10-CM | POA: Insufficient documentation

## 2016-11-15 DIAGNOSIS — L97929 Non-pressure chronic ulcer of unspecified part of left lower leg with unspecified severity: Principal | ICD-10-CM

## 2016-11-15 DIAGNOSIS — M86272 Subacute osteomyelitis, left ankle and foot: Secondary | ICD-10-CM | POA: Diagnosis not present

## 2016-11-15 DIAGNOSIS — M86271 Subacute osteomyelitis, right ankle and foot: Secondary | ICD-10-CM

## 2016-11-15 NOTE — Progress Notes (Signed)
Office Visit Note   Patient: Eric Scott           Date of Birth: 09/04/1950           MRN: [id number] Visit Date: 11/15/2016              Requested by: [name], MD [place], [place] [id number] PCP: [name], MD  Chief Complaint  Patient presents with  . Foot Pain    B/L heel have an open sore/pain and went to ED 11/09/2015      HPI: Patient is a 66 year old nursing home resident who does not ambulate. He states he gets around in a wheelchair. He states he has six-month history of ulceration to the right fifth metatarsal head and left heel. Patient is in [facility] boots he is undergoing excellent conservative therapy he currently has a PICC line with IV antibiotics.  10/12/2016 patient underwent ankle-brachial indices and they were 0.8 on the left 0.77 on the right with triphasic flow in the left foot and biphasic flow in the right foot.  Assessment & Plan: Visit Diagnoses:  1. 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 Diabetic polyneuropathy associated with type 2 diabetes mellitus ([place])   3. Arterial insufficiency of lower extremity ([name])   4. Smoker   5. Subacute osteomyelitis, left ankle and foot ([name])   6. Subacute osteomyelitis, right ankle and foot ([place])     Plan: With the osteomyelitis of the fifth MTP joint right foot we'll plan for fifth ray amputation the right foot with the chronic ulcer and bone edema in the left heel patient will require a transtibial amputation on the left. Risks and benefits were discussed including risk of further sepsis with not treating these infections. Patient states he understands states he would like to wait for a week we will try to set up surgery for 1 week. He'll continue his IV antibiotics.  Follow-Up Instructions: Return in about 3 weeks (around 12/06/2016).   Ortho Exam  Patient is alert, oriented, no adenopathy, well-dressed, normal affect, normal  respiratory effort. Patient is nonambulatory he gets around in a wheelchair. Examination he has brawny skin color changes in both legs with pitting edema there is no cellulitis in his legs he has massive venous stasis swelling in both legs. Examination of both feet I cannot palpate a pulse due to swelling but by report he had adequate ankle-brachial indices in May. Examination the left foot he has a massive necrotic wound over the left heel this measures 8 cm in diameter. Review of the MRI scan does show bony edema most likely chronic osteomyelitis. Examination of right foot I cannot palpate a pulse in either. By report his ankle brachial is was 0.77. Patient has necrotic MTP joint of the right foot fifth toe there is exposed bone tendon and joint. MRI scan also confirms osteomyelitis of the joint.  Imaging: No results found.  Labs: Lab Results  Component Value Date   HGBA1C 5.7 (H) 07/20/2014   HGBA1C 5.3 05/28/2012   HGBA1C 5.2 05/27/2012   LABURIC 11.5 (H) 08/24/2009   REPTSTATUS 11/13/2016 FINAL 11/08/2016   REPTSTATUS 11/13/2016 FINAL 11/08/2016   GRAMSTAIN  02/05/2013    RARE WBC PRESENT, PREDOMINANTLY PMN NO SQUAMOUS EPITHELIAL CELLS SEEN FEW GRAM POSITIVE COCCI IN PAIRS Performed at [facility]   CULT NO GROWTH 5 DAYS 11/08/2016   CULT NO GROWTH 5 DAYS 11/08/2016  LABORGA ENTEROCOCCUS FAECALIS (A) 11/08/2016    Orders:  No orders of the defined types were placed in this encounter.  No orders of the defined types were placed in this encounter.    Procedures: No procedures performed  Clinical Data: No additional findings.  ROS:  All other systems negative, except as noted in the HPI. Review of Systems  Objective: Vital Signs: Ht 6' (1.829 m)   Wt 280 lb (127 kg)   BMI 37.97 kg/m   Specialty Comments:  No specialty comments available.  PMFS History: Patient Active Problem List   Diagnosis Date Noted  . Idiopathic chronic venous hypertension of  both lower extremities with ulcer and inflammation ([place]) 11/15/2016  . Arterial insufficiency of lower extremity ([place]) 11/15/2016  . Pressure injury of skin 11/09/2016  . Sepsis ([place]) 11/09/2016  . Osteomyelitis ([place]) 11/09/2016  . Wounds, multiple 11/08/2016  . Chronic venous insufficiency 10/12/2016  . Critical lower limb ischemia 10/12/2016  . Fatty liver 09/10/2016  . History of colonic polyps 09/10/2016  . Gallstone 09/10/2016  . Edema 03/04/2015  . AKI (acute kidney injury) ([place]) 03/04/2015  . Fall 03/04/2015  . Generalized weakness 03/04/2015  . Diabetic polyneuropathy associated with type 2 diabetes mellitus ([place])   . Anxiety   . PVD (peripheral vascular disease) ([place])   . HCAP (healthcare-associated pneumonia) 07/20/2014  . Pneumonia 07/20/2014  . Weight gain 05/02/2014  . Coarse tremors 04/18/2014  . Anxiety state 04/18/2014  . Acute hypoxemic respiratory failure ([place]) 04/07/2014  . Chronic diastolic CHF (congestive heart failure) ([place]) 04/07/2014  . Solitary pulmonary nodule 04/07/2014  . COPD with exacerbation ([place]) 04/02/2014  . Hypoxia 04/02/2014  . CVA (cerebral infarction) 06/01/2012  . Chronic pain (back, legs) 05/30/2012  . Toe laceration, 4th toe 05/30/2012  . C. difficile diarrhea 05/30/2012  . Hypokalemia 05/30/2012  . Acute lacunar stroke ([place]) 05/27/2012  . DM type 2 (diabetes mellitus, type 2) ([place]) 05/27/2012  . Smoker 05/27/2012  . HTN (hypertension) 05/27/2012   Past Medical History:  Diagnosis Date  . Anxiety   . Chronic pain    legs, back; MRI 05/2012 with mild thoracic degenerative changes no spinal stenosis   . COPD (chronic obstructive pulmonary disease) ([place])   . Depression   . Diabetic peripheral neuropathy ([place])    "chronic" (05/27/2012)  . Diastolic CHF ([place])   . Hypercholesteremia   . Hypertension   . Peripheral edema   . PVD (peripheral vascular disease) ([place])   . Spinal stenosis    mild lumbar (MRI 05/2012)-L2-L3 to L4-L5 ,  mild lumbar foraminal stenosis   . Stroke ([place]) 05/2012   Subacute, lacunar infarcts within the left basal ganglia and posterior limp of the left internal capsule/thalamus; "RUE; both feet weak" (05/27/2012)  . Tremor   . Type II diabetes mellitus ([name])     Family History  Problem Relation Age of Onset  . Family history unknown: Yes    Past Surgical History:  Procedure Laterality Date  . ANKLE SURGERY    . HERNIA REPAIR  01/05/2004   "belly button" (05/27/2012)   Social History   Occupational History  . Not on file.   Social History Main Topics  . Smoking status: Former Smoker    Packs/day: 0.50    Years: 45.00    Types: Cigarettes    Quit date: 12/28/2014  . Smokeless tobacco: Never Used  . Alcohol use No     Comment: 05/27/2012 "drank gallons and gallons 20 yr ago or  so; last drink  at least 10 yr ago"  . Drug use: No  . Sexual activity: No

## 2016-11-16 ENCOUNTER — Encounter: Payer: Self-pay | Admitting: Vascular Surgery

## 2016-11-22 ENCOUNTER — Inpatient Hospital Stay (HOSPITAL_COMMUNITY)
Admission: EM | Admit: 2016-11-22 | Discharge: 2016-11-29 | DRG: 315 | Disposition: A | Discharge status: Skilled Nursing Facility | Payer: Medicare Other | Attending: Internal Medicine | Admitting: Internal Medicine

## 2016-11-22 ENCOUNTER — Emergency Department (HOSPITAL_COMMUNITY): Payer: Medicare Other

## 2016-11-22 ENCOUNTER — Encounter (HOSPITAL_COMMUNITY): Payer: Self-pay

## 2016-11-22 DIAGNOSIS — R110 Nausea: Secondary | ICD-10-CM

## 2016-11-22 DIAGNOSIS — Z8673 Personal history of transient ischemic attack (TIA), and cerebral infarction without residual deficits: Secondary | ICD-10-CM

## 2016-11-22 DIAGNOSIS — W19XXXA Unspecified fall, initial encounter: Secondary | ICD-10-CM | POA: Diagnosis present

## 2016-11-22 DIAGNOSIS — I959 Hypotension, unspecified: Principal | ICD-10-CM | POA: Diagnosis present

## 2016-11-22 DIAGNOSIS — Z794 Long term (current) use of insulin: Secondary | ICD-10-CM

## 2016-11-22 DIAGNOSIS — A0472 Enterocolitis due to Clostridium difficile, not specified as recurrent: Secondary | ICD-10-CM | POA: Diagnosis present

## 2016-11-22 DIAGNOSIS — E860 Dehydration: Secondary | ICD-10-CM

## 2016-11-22 DIAGNOSIS — T07XXXA Unspecified multiple injuries, initial encounter: Secondary | ICD-10-CM | POA: Diagnosis present

## 2016-11-22 DIAGNOSIS — Z888 Allergy status to other drugs, medicaments and biological substances status: Secondary | ICD-10-CM

## 2016-11-22 DIAGNOSIS — R61 Generalized hyperhidrosis: Secondary | ICD-10-CM

## 2016-11-22 DIAGNOSIS — R7401 Elevation of levels of liver transaminase levels: Secondary | ICD-10-CM

## 2016-11-22 DIAGNOSIS — E1122 Type 2 diabetes mellitus with diabetic chronic kidney disease: Secondary | ICD-10-CM

## 2016-11-22 DIAGNOSIS — R1032 Left lower quadrant pain: Secondary | ICD-10-CM

## 2016-11-22 DIAGNOSIS — R251 Tremor, unspecified: Secondary | ICD-10-CM | POA: Diagnosis present

## 2016-11-22 DIAGNOSIS — E876 Hypokalemia: Secondary | ICD-10-CM | POA: Diagnosis present

## 2016-11-22 DIAGNOSIS — E119 Type 2 diabetes mellitus without complications: Secondary | ICD-10-CM

## 2016-11-22 DIAGNOSIS — Z91018 Allergy to other foods: Secondary | ICD-10-CM

## 2016-11-22 DIAGNOSIS — K8020 Calculus of gallbladder without cholecystitis without obstruction: Secondary | ICD-10-CM | POA: Diagnosis present

## 2016-11-22 DIAGNOSIS — M869 Osteomyelitis, unspecified: Secondary | ICD-10-CM | POA: Diagnosis present

## 2016-11-22 DIAGNOSIS — E869 Volume depletion, unspecified: Secondary | ICD-10-CM | POA: Diagnosis present

## 2016-11-22 DIAGNOSIS — N179 Acute kidney failure, unspecified: Secondary | ICD-10-CM | POA: Diagnosis present

## 2016-11-22 DIAGNOSIS — E1169 Type 2 diabetes mellitus with other specified complication: Secondary | ICD-10-CM | POA: Diagnosis present

## 2016-11-22 DIAGNOSIS — R748 Abnormal levels of other serum enzymes: Secondary | ICD-10-CM | POA: Diagnosis present

## 2016-11-22 DIAGNOSIS — E1151 Type 2 diabetes mellitus with diabetic peripheral angiopathy without gangrene: Secondary | ICD-10-CM | POA: Diagnosis present

## 2016-11-22 DIAGNOSIS — Z79899 Other long term (current) drug therapy: Secondary | ICD-10-CM

## 2016-11-22 DIAGNOSIS — E1142 Type 2 diabetes mellitus with diabetic polyneuropathy: Secondary | ICD-10-CM | POA: Diagnosis present

## 2016-11-22 DIAGNOSIS — Z87891 Personal history of nicotine dependence: Secondary | ICD-10-CM

## 2016-11-22 DIAGNOSIS — N289 Disorder of kidney and ureter, unspecified: Secondary | ICD-10-CM

## 2016-11-22 DIAGNOSIS — R740 Nonspecific elevation of levels of transaminase and lactic acid dehydrogenase [LDH]: Secondary | ICD-10-CM

## 2016-11-22 DIAGNOSIS — M86171 Other acute osteomyelitis, right ankle and foot: Secondary | ICD-10-CM | POA: Diagnosis present

## 2016-11-22 LAB — CBC WITH DIFFERENTIAL/PLATELET
Basophils Absolute: 0 10*3/uL (ref 0.0–0.1)
Basophils Relative: 0 %
Eosinophils Absolute: 0.1 10*3/uL (ref 0.0–0.7)
Eosinophils Relative: 1 %
HEMATOCRIT: 35.8 % — AB (ref 39.0–52.0)
Hemoglobin: 12 g/dL — ABNORMAL LOW (ref 13.0–17.0)
LYMPHS ABS: 0.9 10*3/uL (ref 0.7–4.0)
Lymphocytes Relative: 6 %
MCH: 33.1 pg (ref 26.0–34.0)
MCHC: 33.5 g/dL (ref 30.0–36.0)
MCV: 98.9 fL (ref 78.0–100.0)
MONOS PCT: 5 %
Monocytes Absolute: 0.7 10*3/uL (ref 0.1–1.0)
NEUTROS PCT: 88 %
Neutro Abs: 14.2 10*3/uL — ABNORMAL HIGH (ref 1.7–7.7)
Platelets: 268 10*3/uL (ref 150–400)
RBC: 3.62 MIL/uL — AB (ref 4.22–5.81)
RDW: 16.1 % — ABNORMAL HIGH (ref 11.5–15.5)
WBC: 16 10*3/uL — AB (ref 4.0–10.5)

## 2016-11-22 LAB — CBG MONITORING, ED: Glucose-Capillary: 126 mg/dL — ABNORMAL HIGH (ref 65–99)

## 2016-11-22 LAB — I-STAT CG4 LACTIC ACID, ED: Lactic Acid, Venous: 0.72 mmol/L (ref 0.5–1.9)

## 2016-11-22 MED ORDER — SODIUM CHLORIDE 0.9 % IV BOLUS (SEPSIS)
1000.0000 mL | Freq: Once | INTRAVENOUS | Status: AC
  Administered 2016-11-22: 1000 mL via INTRAVENOUS

## 2016-11-22 MED ORDER — SODIUM CHLORIDE 0.9 % IV BOLUS (SEPSIS)
1000.0000 mL | Freq: Once | INTRAVENOUS | Status: AC
  Administered 2016-11-23: 1000 mL via INTRAVENOUS

## 2016-11-22 MED ORDER — SODIUM CHLORIDE 0.9 % IV SOLN
Freq: Once | INTRAVENOUS | Status: AC
  Administered 2016-11-22: 1000 mL/h via INTRAVENOUS

## 2016-11-22 NOTE — ED Notes (Signed)
Pt picc line dressing has lifted up, placement not marked in chart.

## 2016-11-22 NOTE — ED Notes (Signed)
EDP  At the bedside.

## 2016-11-22 NOTE — ED Notes (Signed)
Pt had BM, pt cleaned and dried, pt has some skin break down on bottom

## 2016-11-22 NOTE — ED Triage Notes (Signed)
Pt is resident of [facility], brought over tonight for chills, shakiness, diaphoresis, mild abd discomfort.  Pt is being treated for bilat lower leg infections.

## 2016-11-23 ENCOUNTER — Encounter (HOSPITAL_COMMUNITY): Payer: Medicare Other

## 2016-11-23 ENCOUNTER — Encounter (HOSPITAL_COMMUNITY): Payer: Self-pay

## 2016-11-23 DIAGNOSIS — N179 Acute kidney failure, unspecified: Secondary | ICD-10-CM

## 2016-11-23 DIAGNOSIS — E860 Dehydration: Secondary | ICD-10-CM | POA: Diagnosis present

## 2016-11-23 DIAGNOSIS — E1142 Type 2 diabetes mellitus with diabetic polyneuropathy: Secondary | ICD-10-CM | POA: Diagnosis present

## 2016-11-23 DIAGNOSIS — R740 Nonspecific elevation of levels of transaminase and lactic acid dehydrogenase [LDH]: Secondary | ICD-10-CM | POA: Diagnosis not present

## 2016-11-23 DIAGNOSIS — R251 Tremor, unspecified: Secondary | ICD-10-CM | POA: Diagnosis present

## 2016-11-23 DIAGNOSIS — E1140 Type 2 diabetes mellitus with diabetic neuropathy, unspecified: Secondary | ICD-10-CM | POA: Diagnosis not present

## 2016-11-23 DIAGNOSIS — E876 Hypokalemia: Secondary | ICD-10-CM | POA: Diagnosis present

## 2016-11-23 DIAGNOSIS — M86071 Acute hematogenous osteomyelitis, right ankle and foot: Secondary | ICD-10-CM

## 2016-11-23 DIAGNOSIS — Z888 Allergy status to other drugs, medicaments and biological substances status: Secondary | ICD-10-CM | POA: Diagnosis not present

## 2016-11-23 DIAGNOSIS — Z8673 Personal history of transient ischemic attack (TIA), and cerebral infarction without residual deficits: Secondary | ICD-10-CM | POA: Diagnosis not present

## 2016-11-23 DIAGNOSIS — I959 Hypotension, unspecified: Secondary | ICD-10-CM

## 2016-11-23 DIAGNOSIS — R61 Generalized hyperhidrosis: Secondary | ICD-10-CM

## 2016-11-23 DIAGNOSIS — E869 Volume depletion, unspecified: Secondary | ICD-10-CM | POA: Diagnosis present

## 2016-11-23 DIAGNOSIS — Z91018 Allergy to other foods: Secondary | ICD-10-CM | POA: Diagnosis not present

## 2016-11-23 DIAGNOSIS — W19XXXA Unspecified fall, initial encounter: Secondary | ICD-10-CM | POA: Diagnosis present

## 2016-11-23 DIAGNOSIS — M86171 Other acute osteomyelitis, right ankle and foot: Secondary | ICD-10-CM | POA: Diagnosis present

## 2016-11-23 DIAGNOSIS — Z79899 Other long term (current) drug therapy: Secondary | ICD-10-CM | POA: Diagnosis not present

## 2016-11-23 DIAGNOSIS — Z794 Long term (current) use of insulin: Secondary | ICD-10-CM | POA: Diagnosis not present

## 2016-11-23 DIAGNOSIS — N289 Disorder of kidney and ureter, unspecified: Secondary | ICD-10-CM | POA: Diagnosis not present

## 2016-11-23 DIAGNOSIS — Z87891 Personal history of nicotine dependence: Secondary | ICD-10-CM | POA: Diagnosis not present

## 2016-11-23 DIAGNOSIS — R748 Abnormal levels of other serum enzymes: Secondary | ICD-10-CM

## 2016-11-23 DIAGNOSIS — A0472 Enterocolitis due to Clostridium difficile, not specified as recurrent: Secondary | ICD-10-CM | POA: Diagnosis present

## 2016-11-23 DIAGNOSIS — E1151 Type 2 diabetes mellitus with diabetic peripheral angiopathy without gangrene: Secondary | ICD-10-CM | POA: Diagnosis present

## 2016-11-23 DIAGNOSIS — M8600 Acute hematogenous osteomyelitis, unspecified site: Secondary | ICD-10-CM | POA: Diagnosis not present

## 2016-11-23 DIAGNOSIS — T07XXXA Unspecified multiple injuries, initial encounter: Secondary | ICD-10-CM | POA: Diagnosis not present

## 2016-11-23 DIAGNOSIS — E1169 Type 2 diabetes mellitus with other specified complication: Secondary | ICD-10-CM | POA: Diagnosis present

## 2016-11-23 DIAGNOSIS — K8020 Calculus of gallbladder without cholecystitis without obstruction: Secondary | ICD-10-CM | POA: Diagnosis present

## 2016-11-23 LAB — COMPREHENSIVE METABOLIC PANEL
ALK PHOS: 333 U/L — AB (ref 38–126)
ALT: 116 U/L — AB (ref 17–63)
AST: 88 U/L — ABNORMAL HIGH (ref 15–41)
Albumin: 2.7 g/dL — ABNORMAL LOW (ref 3.5–5.0)
Anion gap: 14 (ref 5–15)
BUN: 71 mg/dL — ABNORMAL HIGH (ref 6–20)
CO2: 21 mmol/L — AB (ref 22–32)
CREATININE: 4.09 mg/dL — AB (ref 0.61–1.24)
Calcium: 8.6 mg/dL — ABNORMAL LOW (ref 8.9–10.3)
Chloride: 103 mmol/L (ref 101–111)
GFR calc Af Amer: 16 mL/min — ABNORMAL LOW (ref >60)
GFR calc non Af Amer: 14 mL/min — ABNORMAL LOW (ref >60)
GLUCOSE: 143 mg/dL — AB (ref 65–99)
Potassium: 3.6 mmol/L (ref 3.5–5.1)
SODIUM: 138 mmol/L (ref 135–145)
Total Bilirubin: 1.2 mg/dL (ref 0.3–1.2)
Total Protein: 6.4 g/dL — ABNORMAL LOW (ref 6.5–8.1)

## 2016-11-23 LAB — URINALYSIS, ROUTINE W REFLEX MICROSCOPIC
Bilirubin Urine: NEGATIVE
Glucose, UA: NEGATIVE mg/dL
Ketones, ur: NEGATIVE mg/dL
Leukocytes, UA: NEGATIVE
Nitrite: NEGATIVE
PROTEIN: NEGATIVE mg/dL
Specific Gravity, Urine: 1.01 (ref 1.005–1.030)
pH: 5 (ref 5.0–8.0)

## 2016-11-23 LAB — BASIC METABOLIC PANEL
Anion gap: 10 (ref 5–15)
BUN: 65 mg/dL — ABNORMAL HIGH (ref 6–20)
CO2: 21 mmol/L — ABNORMAL LOW (ref 22–32)
Calcium: 8.1 mg/dL — ABNORMAL LOW (ref 8.9–10.3)
Chloride: 108 mmol/L (ref 101–111)
Creatinine, Ser: 3.81 mg/dL — ABNORMAL HIGH (ref 0.61–1.24)
GFR calc Af Amer: 18 mL/min — ABNORMAL LOW (ref >60)
GFR calc non Af Amer: 15 mL/min — ABNORMAL LOW (ref >60)
Glucose, Bld: 99 mg/dL (ref 65–99)
Potassium: 3.6 mmol/L (ref 3.5–5.1)
Sodium: 139 mmol/L (ref 135–145)

## 2016-11-23 LAB — GLUCOSE, CAPILLARY
GLUCOSE-CAPILLARY: 98 mg/dL (ref 65–99)
Glucose-Capillary: 93 mg/dL (ref 65–99)
Glucose-Capillary: 89 mg/dL (ref 65–99)

## 2016-11-23 LAB — NA AND K (SODIUM & POTASSIUM), RAND UR
Potassium Urine: 17 mmol/L
SODIUM UR: 71 mmol/L

## 2016-11-23 LAB — OSMOLALITY, URINE: OSMOLALITY UR: 308 mosm/kg (ref 300–900)

## 2016-11-23 LAB — CREATININE, URINE, RANDOM: CREATININE, URINE: 57.02 mg/dL

## 2016-11-23 LAB — CK: Total CK: 20 U/L — ABNORMAL LOW (ref 49–397)

## 2016-11-23 LAB — VANCOMYCIN, TROUGH: Vancomycin Tr: 44 ug/mL (ref 15–20)

## 2016-11-23 MED ORDER — ALPRAZOLAM 1 MG PO TABS
1.0000 mg | ORAL_TABLET | Freq: Three times a day (TID) | ORAL | Status: DC
  Administered 2016-11-23 – 2016-11-29 (×18): 1 mg via ORAL
  Filled 2016-11-23 (×18): qty 1

## 2016-11-23 MED ORDER — FLUTICASONE FUROATE-VILANTEROL 100-25 MCG/INH IN AEPB
1.0000 | INHALATION_SPRAY | Freq: Every day | RESPIRATORY_TRACT | Status: DC
  Administered 2016-11-23 – 2016-11-29 (×7): 1 via RESPIRATORY_TRACT
  Filled 2016-11-23: qty 28

## 2016-11-23 MED ORDER — HEPARIN SODIUM (PORCINE) 5000 UNIT/ML IJ SOLN
5000.0000 [IU] | Freq: Three times a day (TID) | INTRAMUSCULAR | Status: DC
  Administered 2016-11-23 – 2016-11-29 (×19): 5000 [IU] via SUBCUTANEOUS
  Filled 2016-11-23 (×19): qty 1

## 2016-11-23 MED ORDER — ACETAMINOPHEN 325 MG PO TABS: 650.0000 mg | ORAL_TABLET | Freq: Four times a day (QID) | ORAL | Status: DC | PRN

## 2016-11-23 MED ORDER — VANCOMYCIN HCL IN DEXTROSE 1-5 GM/200ML-% IV SOLN
1000.0000 mg | INTRAVENOUS | Status: AC
  Administered 2016-11-23 (×2): 1000 mg via INTRAVENOUS
  Filled 2016-11-23: qty 200

## 2016-11-23 MED ORDER — INSULIN ASPART 100 UNIT/ML ~~LOC~~ SOLN: 0.0000 [IU] | Freq: Every day | SUBCUTANEOUS | Status: DC

## 2016-11-23 MED ORDER — CITALOPRAM HYDROBROMIDE 20 MG PO TABS
20.0000 mg | ORAL_TABLET | Freq: Every day | ORAL | Status: DC
  Administered 2016-11-23 – 2016-11-29 (×7): 20 mg via ORAL
  Filled 2016-11-23 (×7): qty 1

## 2016-11-23 MED ORDER — COLLAGENASE 250 UNIT/GM EX OINT
TOPICAL_OINTMENT | Freq: Every day | CUTANEOUS | Status: DC
  Administered 2016-11-23 – 2016-11-29 (×7): via TOPICAL
  Filled 2016-11-23: qty 30

## 2016-11-23 MED ORDER — OXYCODONE HCL 5 MG PO TABS
5.0000 mg | ORAL_TABLET | Freq: Three times a day (TID) | ORAL | Status: DC | PRN
  Administered 2016-11-24 – 2016-11-29 (×3): 5 mg via ORAL
  Filled 2016-11-23 (×3): qty 1

## 2016-11-23 MED ORDER — PIPERACILLIN-TAZOBACTAM 3.375 G IVPB 30 MIN
3.3750 g | Freq: Once | INTRAVENOUS | Status: AC
  Administered 2016-11-23: 3.375 g via INTRAVENOUS
  Filled 2016-11-23: qty 50

## 2016-11-23 MED ORDER — VANCOMYCIN HCL IN DEXTROSE 1-5 GM/200ML-% IV SOLN
1000.0000 mg | Freq: Once | INTRAVENOUS | Status: DC
  Filled 2016-11-23: qty 200

## 2016-11-23 MED ORDER — INSULIN ASPART 100 UNIT/ML ~~LOC~~ SOLN
0.0000 [IU] | Freq: Three times a day (TID) | SUBCUTANEOUS | Status: DC
  Administered 2016-11-24: 2 [IU] via SUBCUTANEOUS
  Administered 2016-11-25 (×2): 1 [IU] via SUBCUTANEOUS
  Administered 2016-11-26: 2 [IU] via SUBCUTANEOUS
  Administered 2016-11-26 (×2): 1 [IU] via SUBCUTANEOUS
  Administered 2016-11-27: 2 [IU] via SUBCUTANEOUS
  Administered 2016-11-27: 1 [IU] via SUBCUTANEOUS
  Administered 2016-11-28: 2 [IU] via SUBCUTANEOUS
  Administered 2016-11-28: 3 [IU] via SUBCUTANEOUS
  Administered 2016-11-28: 1 [IU] via SUBCUTANEOUS
  Administered 2016-11-29: 2 [IU] via SUBCUTANEOUS
  Administered 2016-11-29: 1 [IU] via SUBCUTANEOUS

## 2016-11-23 MED ORDER — SODIUM CHLORIDE 0.9 % IV SOLN
INTRAVENOUS | Status: DC
  Administered 2016-11-23 – 2016-11-24 (×3): via INTRAVENOUS

## 2016-11-23 MED ORDER — ACETAMINOPHEN 650 MG RE SUPP: 650.0000 mg | Freq: Four times a day (QID) | RECTAL | Status: DC | PRN

## 2016-11-23 MED ORDER — IPRATROPIUM-ALBUTEROL 0.5-2.5 (3) MG/3ML IN SOLN
3.0000 mL | Freq: Three times a day (TID) | RESPIRATORY_TRACT | Status: DC
  Administered 2016-11-23 – 2016-11-26 (×10): 3 mL via RESPIRATORY_TRACT
  Filled 2016-11-23 (×10): qty 3

## 2016-11-23 MED ORDER — ENSURE ENLIVE PO LIQD
237.0000 mL | Freq: Two times a day (BID) | ORAL | Status: DC
  Administered 2016-11-23 – 2016-11-29 (×6): 237 mL via ORAL

## 2016-11-23 MED ORDER — DEXTROSE 5 % IV SOLN
1.0000 g | INTRAVENOUS | Status: DC
  Administered 2016-11-24 – 2016-11-27 (×4): 1 g via INTRAVENOUS
  Filled 2016-11-23 (×5): qty 1

## 2016-11-23 MED ORDER — ATORVASTATIN CALCIUM 20 MG PO TABS
20.0000 mg | ORAL_TABLET | Freq: Two times a day (BID) | ORAL | Status: DC
  Administered 2016-11-23 – 2016-11-29 (×13): 20 mg via ORAL
  Filled 2016-11-23 (×13): qty 1

## 2016-11-23 MED ORDER — DEXTROSE 5 % IV SOLN
2.0000 g | Freq: Two times a day (BID) | INTRAVENOUS | Status: DC
  Administered 2016-11-23: 2 g via INTRAVENOUS
  Filled 2016-11-23: qty 2

## 2016-11-23 NOTE — NC FL2 (Signed)
[place] MEDICAID FL2 LEVEL OF CARE SCREENING TOOL     IDENTIFICATION  Patient Name: Eric Scott Birthdate: [birth year] Sex: male Admission Date (Current Location): 11/22/2016  [facility] and [place] Number:  [employer] and Address:  [facility] [address] [place]      Provider Number: [id number]  Attending Physician Name and Address:  Tawni Millers  Relative Name and Phone Number:       Current Level of Care: Hospital Recommended Level of Care: [place] Prior Approval Number:    Date Approved/Denied:   PASRR Number:    Discharge Plan: SNF    Current Diagnoses: Patient Active Problem List   Diagnosis Date Noted  . Volume depletion 11/23/2016  . 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 Arterial insufficiency of lower extremity ([place]) 11/15/2016  . Pressure injury of skin 11/09/2016  . Sepsis ([place]) 11/09/2016  . Osteomyelitis ([place]) 11/09/2016  . Wounds, multiple 11/08/2016  . Chronic venous insufficiency 10/12/2016  . Critical lower limb ischemia 10/12/2016  . Fatty liver 09/10/2016  . History of colonic polyps 09/10/2016  . Gallstone 09/10/2016  . Edema 03/04/2015  . AKI (acute kidney injury) ([place]) 03/04/2015  . Fall 03/04/2015  . Generalized weakness 03/04/2015  . Diabetic polyneuropathy associated with type 2 diabetes mellitus ([place])   . Anxiety   . PVD (peripheral vascular disease) ([place])   . HCAP (healthcare-associated pneumonia) 07/20/2014  . Pneumonia 07/20/2014  . Weight gain 05/02/2014  . Coarse tremors 04/18/2014  . Anxiety state 04/18/2014  . Acute hypoxemic respiratory failure ([place]) 04/07/2014  . Chronic diastolic CHF (congestive heart failure) ([place]) 04/07/2014  . Solitary pulmonary nodule 04/07/2014  . COPD with exacerbation ([place]) 04/02/2014  . Hypoxia 04/02/2014  . CVA (cerebral infarction) 06/01/2012  .  Chronic pain (back, legs) 05/30/2012  . Toe laceration, 4th toe 05/30/2012  . C. difficile diarrhea 05/30/2012  . Hypokalemia 05/30/2012  . Acute lacunar stroke ([place]) 05/27/2012  . DM type 2 (diabetes mellitus, type 2) ([place]) 05/27/2012  . Smoker 05/27/2012  . HTN (hypertension) 05/27/2012    Orientation RESPIRATION BLADDER Height & Weight     Self  Normal Continent Weight:   280 Height:   6'0  BEHAVIORAL SYMPTOMS/MOOD NEUROLOGICAL BOWEL NUTRITION STATUS      Incontinent Diet (See DC summary)  AMBULATORY STATUS COMMUNICATION OF NEEDS Skin   Extensive Assist Verbally PU Stage and Appropriate Care, Other (Comment) (heel and foot)     Unstageable - Full thickness tissue loss in which the base of the ulcer is covered by slough (yellow, tan, gray, green or brown) and/or eschar (tan, brown or black) in the wound bed.                   Personal Care Assistance Level of Assistance  Bathing, Feeding, Dressing Bathing Assistance: Limited assistance Feeding assistance: Limited assistance Dressing Assistance: Limited assistance     Functional Limitations Info  Sight, Hearing, Speech Sight Info: Adequate Hearing Info: Adequate Speech Info: Adequate    SPECIAL CARE FACTORS FREQUENCY  PT (By licensed PT), OT (By licensed OT)     PT Frequency: 5x OT Frequency: 5x            Contractures Contractures Info: Not present    Additional Factors Info  Code Status, Allergies, Isolation Precautions Code Status Info: Full Code Allergies Info: Blueberry Flavor, Cucumber Extract, Flexeril Cyclobenzaprine Hcl, Kiwi  Extract     Isolation Precautions Info: MRSA     Current Medications (11/23/2016):  This is the current hospital active medication list Current Facility-Administered Medications  Medication Dose Route Frequency Provider Last Rate Last Dose  . 0.9 %  sodium chloride infusion   Intravenous Continuous Orvan Falconer, MD 75 mL/hr at 11/23/16 0810    . acetaminophen (TYLENOL)  tablet 650 mg  650 mg Oral Q6H PRN Orvan Falconer, MD       Or  . acetaminophen (TYLENOL) suppository 650 mg  650 mg Rectal Q6H PRN Orvan Falconer, MD      . ALPRAZolam Duanne Moron) tablet 1 mg  1 mg Oral TID Orvan Falconer, MD   1 mg at 11/23/16 1045  . atorvastatin (LIPITOR) tablet 20 mg  20 mg Oral BID Orvan Falconer, MD   20 mg at 11/23/16 1047  . [START ON 11/24/2016] ceFEPIme (MAXIPIME) 1 g in dextrose 5 % 50 mL IVPB  1 g Intravenous Q24H Arrien, Jimmy Picket, MD      . citalopram (CELEXA) tablet 20 mg  20 mg Oral Daily Orvan Falconer, MD   20 mg at 11/23/16 1045  . collagenase (SANTYL) ointment   Topical Daily Orvan Falconer, MD      . feeding supplement (ENSURE ENLIVE) (ENSURE ENLIVE) liquid 237 mL  237 mL Oral BID BM Arrien, Jimmy Picket, MD      . fluticasone furoate-vilanterol (BREO ELLIPTA) 100-25 MCG/INH 1 puff  1 puff Inhalation Daily Orvan Falconer, MD      . heparin injection 5,000 Units  5,000 Units Subcutaneous Q8H Orvan Falconer, MD      . insulin aspart (novoLOG) injection 0-5 Units  0-5 Units Subcutaneous QHS Orvan Falconer, MD      . insulin aspart (novoLOG) injection 0-9 Units  0-9 Units Subcutaneous TID WC Orvan Falconer, MD      . ipratropium-albuterol (DUONEB) 0.5-2.5 (3) MG/3ML nebulizer solution 3 mL  3 mL Inhalation TID Orvan Falconer, MD   3 mL at 11/23/16 0758  . oxyCODONE (Oxy IR/ROXICODONE) immediate release tablet 5 mg  5 mg Oral Q8H PRN Orvan Falconer, MD         Discharge Medications: Please see discharge summary for a list of discharge medications.  Relevant Imaging Results:  Relevant Lab Results:   Additional Information SSN:   [id number]  [place], [place]

## 2016-11-23 NOTE — ED Notes (Signed)
Pt had diarrhea, Clean and dry, resting in bed

## 2016-11-23 NOTE — ED Notes (Signed)
Pt received antibiotic at the facility through PICC, EDP will wait for labs and CT to decide if patient needs another antibiotic or a change in antibiotic.

## 2016-11-23 NOTE — H&P (Signed)
History and Physical    Eric Scott WGN:[id number] DOB: 15-Oct-1950 DOA: 11/22/2016  PCP: [name], MD  Patient coming from: SNF   Chief Complaint:   Hypotensive and diaphoresis.  HPI: Eric Scott is an 66 y.o. male with hx of anxiety, chronic pain, recently discharged on IV Van and Cefepime IV thru his right arm PICC line for osteomyelitis, DM, PVD, prior lacunar infarct, brought to the ER as he was found to be hypotensive and diaphoretic.  His BS was 140, Cr was found to be at 4.09, and WBC of 16K.  His BP was soft, but after 2 L [place], BP rose to 105 and remained stable.  His lactic acid was negative.  BC was done, and hospitalist was asked to admit him for possible sepsis, volume depletion, and AKI.    ED Course:  See above.  Rewiew of Systems:  Constitutional: Negative for malaise, fever and chills. No significant weight loss or weight gain Eyes: Negative for eye pain, redness and discharge, diplopia, visual changes, or flashes of light. ENMT: Negative for ear pain, hoarseness, nasal congestion, sinus pressure and sore throat. No headaches; tinnitus, drooling, or problem swallowing. Cardiovascular: Negative for chest pain, palpitations, diaphoresis, dyspnea and peripheral edema. ; No orthopnea, PND Respiratory: Negative for cough, hemoptysis, wheezing and stridor. No pleuritic chestpain. Gastrointestinal: Negative for diarrhea, constipation,  melena, blood in stool, hematemesis, jaundice and rectal bleeding.    Genitourinary: Negative for frequency, dysuria, incontinence,flank pain and hematuria; Musculoskeletal: Negative for back pain and neck pain. Negative for swelling and trauma.;  Skin: . Negative for pruritus, rash, abrasions, bruising and skin lesion.; ulcerations Neuro: Negative for headache, lightheadedness and neck stiffness. Negative for weakness, altered level of consciousness , altered mental status, extremity weakness, burning feet, involuntary movement, seizure  and syncope.  Psych: negative for anxiety, depression, insomnia, tearfulness, panic attacks, hallucinations, paranoia, suicidal or homicidal ideation    Past Medical History:  Diagnosis Date  . Anxiety   . Chronic pain    legs, back; MRI 05/2012 with mild thoracic degenerative changes no spinal stenosis   . COPD (chronic obstructive pulmonary disease) ([place])   . Depression   . Diabetic peripheral neuropathy ([place])    "chronic" (05/27/2012)  . Diastolic CHF ([place])   . Hypercholesteremia   . Hypertension   . Peripheral edema   . PVD (peripheral vascular disease) ([place])   . Spinal stenosis    mild lumbar (MRI 05/2012)-L2-L3 to L4-L5 , mild lumbar foraminal stenosis   . Stroke ([place]) 05/2012   Subacute, lacunar infarcts within the left basal ganglia and posterior limp of the left internal capsule/thalamus; "RUE; both feet weak" (05/27/2012)  . Tremor   . Type II diabetes mellitus ([place])     Past Surgical History:  Procedure Laterality Date  . ANKLE SURGERY    . HERNIA REPAIR  01/05/2004   "belly button" (05/27/2012)     reports that he quit smoking about 22 months ago. His smoking use included Cigarettes. He has a 22.50 pack-year smoking history. He has never used smokeless tobacco. He reports that he does not drink alcohol or use drugs.  Allergies  Allergen Reactions  . Blueberry Flavor   . Cucumber Extract Other (See Comments)    "Fells like I'm having a heart attack"  . Flexeril [Cyclobenzaprine Hcl] Other (See Comments)    "whole body  Tremors" (05/27/2012)  . Kiwi Extract Other (See Comments)    "feels like I'm having a heart attack"  Family History  Problem Relation Age of Onset  . Family history unknown: Yes     Prior to Admission medications   Medication Sig Start Date End Date Taking? Authorizing Provider  albuterol (PROVENTIL) (2.5 MG/3ML) 0.083% nebulizer solution Take 2.5 mg by nebulization every 3 (three) hours as needed for wheezing or shortness of breath.     [provider]  ALPRAZolam Duanne Moron) 1 MG tablet Take 1 tablet (1 mg total) by mouth 3 (three) times daily. 03/07/15   Samuella Cota, MD  atorvastatin (LIPITOR) 20 MG tablet Take 20 mg by mouth 2 (two) times daily.     [provider]  [place] Patter 100-25 MCG/INH AEPB  09/27/16   [provider]  ceFEPIme 2 g in dextrose 5 % 50 mL Inject 2 g into the vein every 12 (twelve) hours. Monitor labs and make dose adjustments per pharmacy protocol and input. 11/11/16   Samuella Cota, MD  citalopram (CELEXA) 20 MG tablet Take 20 mg by mouth daily.    [provider]  collagenase (SANTYL) ointment Apply topically daily. Apply Santyl to left heel and right outer foot Q day, then cover with moist gauze, then kerlex and tape. 11/11/16   Samuella Cota, MD  fish oil-omega-3 fatty acids 1000 MG capsule Take 1 g by mouth 2 (two) times daily.    [provider]  furosemide (LASIX) 80 MG tablet Take 80 mg by mouth daily.    [provider]  gabapentin (NEURONTIN) 800 MG tablet Take 800 mg by mouth 3 (three) times daily.    [provider]  Cleda Clarks 100 UNIT/ML KiwkPen Per sliding scale 10/07/16   [provider]  ipratropium (ATROVENT) 0.02 % nebulizer solution Take 0.5 mg by nebulization every 6 (six) hours as needed for wheezing or shortness of breath.    [provider]  ipratropium-albuterol (DUONEB) 0.5-2.5 (3) MG/3ML SOLN Inhale 3 mLs into the lungs 3 (three) times daily.  07/09/16   [provider]  l-methylfolate-B6-B12 (METANX) 3-35-2 MG TABS tablet Take 1 tablet by mouth 2 (two) times daily.    [provider]  LANTUS SOLOSTAR 100 UNIT/ML Solostar Pen  10/29/16   [provider]  lisinopril (PRINIVIL,ZESTRIL) 5 MG tablet Take 5 mg by mouth daily.    [provider]  morphine (MS CONTIN) 15 MG 12 hr tablet Take 1 tablet (15 mg total) by mouth every 8 (eight) hours. 11/11/16    Samuella Cota, MD  oxyCODONE (OXY IR/ROXICODONE) 5 MG immediate release tablet Take 1 tablet (5 mg total) by mouth every 8 (eight) hours as needed for severe pain. 11/11/16   Samuella Cota, MD  vancomycin (VANCOCIN) 1-5 GM/200ML-% SOLN Inject 150 mLs (750 mg total) into the vein every 12 (twelve) hours. Monitor labs and make dose adjustments per pharmacy protocol and input. 11/12/16   Samuella Cota, MD  Vitamin D, Ergocalciferol, (DRISDOL) 50000 units CAPS capsule Take 50,000 Units by mouth every 30 (thirty) days.    [provider]  white petrolatum (VASELINE) GEL Apply 1 application topically 2 (two) times daily.    [provider]    Physical Exam: Vitals:   11/23/16 0133 11/23/16 0200 11/23/16 0230 11/23/16 0300  BP:  101/65 (!) 103/42 (!) 111/58  Pulse:  76 81 (!) 108  Resp:  18 17 20   Temp:      TempSrc:      SpO2: 95% 97% 95% (!) 83%  Constitutional: NAD, calm, comfortable Vitals:   11/23/16 0133 11/23/16 0200 11/23/16 0230 11/23/16 0300  BP:  101/65 (!) 103/42 (!) 111/58  Pulse:  76 81 (!) 108  Resp:  18 17 20   Temp:      TempSrc:      SpO2: 95% 97% 95% (!) 83%   Eyes: PERRL, lids and conjunctivae normal ENMT: Mucous membranes are moist. Posterior pharynx clear of any exudate or lesions.Normal dentition.  Neck: normal, supple, no masses, no thyromegaly Respiratory: clear to auscultation bilaterally, no wheezing, no crackles. Normal respiratory effort. No accessory muscle use.  Cardiovascular: Regular rate and rhythm, no murmurs / rubs / gallops. No extremity edema. 2+ pedal pulses. No carotid bruits.  Abdomen: no tenderness, no masses palpated. No hepatosplenomegaly. Bowel sounds positive.  Musculoskeletal: no clubbing / cyanosis. No joint deformity upper and lower extremities. Good ROM, no contractures. Normal muscle tone.  Skin: no rashes, lesions, ulcers. No induration Neurologic: CN 2-12 grossly intact. Sensation intact, DTR normal.  Strength 5/5 in all 4.  Psychiatric: Normal judgment and insight. Alert and oriented x 3. Normal mood.   Labs on Admission: I have personally reviewed following labs and imaging studies CBC:  Recent Labs Lab 11/22/16 2325  WBC 16.0*  NEUTROABS 14.2*  HGB 12.0*  HCT 35.8*  MCV 98.9  PLT 656   Basic Metabolic Panel:  Recent Labs Lab 11/22/16 2325  NA 138  K 3.6  CL 103  CO2 21*  GLUCOSE 143*  BUN 71*  CREATININE 4.09*  CALCIUM 8.6*   GFR: Estimated Creatinine Clearance: 24.5 mL/min (A) (by C-G formula based on SCr of 4.09 mg/dL (H)). Liver Function Tests:  Recent Labs Lab 11/22/16 2325  AST 88*  ALT 116*  ALKPHOS 333*  BILITOT 1.2  PROT 6.4*  ALBUMIN 2.7*    Recent Labs Lab 11/22/16 2247  GLUCAP 126*   Urine analysis:    Component Value Date/Time   COLORURINE YELLOW 11/08/2016 2020   APPEARANCEUR CLOUDY (A) 11/08/2016 2020   LABSPEC 1.017 11/08/2016 2020   PHURINE 5.0 11/08/2016 2020   GLUCOSEU NEGATIVE 11/08/2016 2020   HGBUR SMALL (A) 11/08/2016 2020   BILIRUBINUR NEGATIVE 11/08/2016 2020   [place] 11/08/2016 2020   PROTEINUR 100 (A) 11/08/2016 2020   UROBILINOGEN 0.2 03/04/2015 1835   NITRITE NEGATIVE 11/08/2016 2020   LEUKOCYTESUR MODERATE (A) 11/08/2016 2020    Recent Results (from the past 240 hour(s))  Blood Culture (routine x 2)     Status: None (Preliminary result)   Collection Time: 11/22/16 11:39 PM  Result Value Ref Range Status   Specimen Description BLOOD LEFT ARM  Final   Special Requests   Final    BOTTLES DRAWN AEROBIC AND ANAEROBIC Blood Culture adequate volume   Culture PENDING  Incomplete   Report Status PENDING  Incomplete  Blood Culture (routine x 2)     Status: None (Preliminary result)   Collection Time: 11/22/16 11:46 PM  Result Value Ref Range Status   Specimen Description BLOOD LEFT ARM  Final   Special Requests   Final    Blood Culture adequate volume BOTTLES DRAWN AEROBIC AND ANAEROBIC   Culture  PENDING  Incomplete   Report Status PENDING  Incomplete     Radiological Exams on Admission: Ct Abdomen Pelvis Wo Contrast  Result Date: 11/23/2016 CLINICAL DATA:  66 year old male with nausea and abdominal discomfort. Diaphoresis. Abdominal CT dated 10/25/2016 EXAM: CT ABDOMEN AND PELVIS WITHOUT CONTRAST TECHNIQUE: Multidetector CT imaging of the abdomen  and pelvis was performed following the standard protocol without IV contrast. COMPARISON:  None. FINDINGS: Evaluation of this exam is limited in the absence of intravenous contrast. Lower chest: Minimal bibasilar atelectatic changes. There is a 4 mm left lung base nodule as seen on the prior CT. No intra-abdominal free air or free fluid. Hepatobiliary: The liver is unremarkable. No intrahepatic biliary ductal dilatation. There is a 3 cm stone within the gallbladder. The gallbladder is distended. No pericholecystic fluid. Ultrasound may provide better evaluation of the gallbladder clinically indicated. Pancreas: Unremarkable. No pancreatic ductal dilatation or surrounding inflammatory changes. Spleen: Normal in size without focal abnormality. 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 The left adrenal gland is unremarkable. Bilateral renal hypodense lesions are not well characterized on this unenhanced CT but appears similar to prior study. A 2 cm hypodense lesion in the interpolar aspect of the left kidney demonstrates fluid attenuation and most likely represents a cyst. Subcentimeter exophytic right renal inferior pole lesion is not characterized. There is no hydronephrosis or nephrolithiasis on either side. The visualized ureters appear unremarkable. The previously described thickened appearance of the left bladder wall is less appreciated on this exam. Follow-up with cystoscopy as previously recommended. Stomach/Bowel: There is sigmoid diverticulosis without active inflammatory changes. No evidence of bowel  obstruction or active inflammation. Normal appendix. Vascular/Lymphatic: There is moderate aortoiliac atherosclerotic disease. Evaluation of the vasculature is limited in the absence of intravenous contrast. No portal venous gas identified. Chest mildly enlarged external iliac lymph nodes as previously described. Follow-up evaluation as per recommendations of the prior CT. Reproductive: The prostate and seminal vesicles are grossly unremarkable. Other: Small fat containing bilateral inguinal and umbilical hernia. Musculoskeletal: Degenerative changes of the spine. Lower lumbar disc desiccation with vacuum phenomena. No acute fracture. IMPRESSION: 1. No hydronephrosis or nephrolithiasis. 2. Cholelithiasis. No pericholecystic fluid. Ultrasound may provide better evaluation of the gallbladder if clinically indicated. 3. Sigmoid diverticulosis. No bowel obstruction or active inflammation. Normal appendix. 4. Aortic Atherosclerosis (ICD10-I70.0). Electronically Signed   By: Anner Crete M.D.   On: 11/23/2016 02:41   Dg Chest Port 1 View  Result Date: 11/23/2016 CLINICAL DATA:  66 year old male with diaphoresis. EXAM: PORTABLE CHEST 1 VIEW COMPARISON:  Chest radiograph dated 11/08/2016 FINDINGS: There is a right sided PICC with tip at the cavoatrial junction. The lungs are clear. There is overall interval improvement of the previously seen interstitial edema. There is no pleural effusion or pneumothorax. There is enlargement of the cardiopericardial silhouette. Atherosclerotic calcification of the aortic arch. No acute osseous pathology. IMPRESSION: 1. Cardiomegaly.  No focal consolidation. 2. Interval improvement of the previously seen pulmonary edema. 3. Right-sided PICC with tip at the cavoatrial junction in stable positioning. Electronically Signed   By: Anner Crete M.D.   On: 11/23/2016 00:24    EKG: Independently reviewed.   Assessment/Plan Active Problems:   DM type 2 (diabetes mellitus, type 2)  ([name])   AKI (acute kidney injury) ([place])   Gallstone   Wounds, multiple   Osteomyelitis ([name])   Volume depletion   PLAN:   AKI:  I suspect his AKI was due to volume depletion, worsened with ACE I, and vancomycin.  Will give antibiotics, hold ACE I, and avoid nephrotoxic drug as much as possible.  If Cr doesn't improve, should consult nephrology.  I will continue with IV Vancomycin with pharmacy adjusting dosing.    Hypotension:  Doubt sepsis.  Continue with IV Van and Cefepime.  Requested  pharmacy dosing.  Elevated LFTs:  Unclear etiology.  No evidence of acute cholecystitis.  Will follow.   Osteomyelitis:  Plan was to follow up with Dr Sharol Given for further recommendation.  He has been on Van/Cefepime thru his PICC line.    DVT prophylaxis: SUB Q Heparin.  Code Status: FULL CODE.   MOST FORM: No feeding tube.  Family Communication: None.  Disposition Plan: SNF when stable.  Consults called: None.  Admission status: Inpatient.    Sahmya Arai MD FACP. [facility]  If 7PM-7AM, please contact night-coverage [email or web address] Password [facility]  11/23/2016, 4:23 AM

## 2016-11-23 NOTE — ED Provider Notes (Addendum)
Eric Scott Provider Note   CSN: [id number] Arrival date & time: 11/22/16  2223  Time seen 23:05 PM   History   Chief Complaint Chief Complaint  Patient presents with  . Diaphoresis    HPI Eric Scott is a 66 y.o. male.  HPI  patient was sent from his nursing facility for lethargy, shaking, chills and diaphoresis and complaints of abdominal discomfort. Patient has been treated for bilateral leg infections, he currently is getting cefepime IV  since June 11 , and vancomycin IV since June 12. He presents with a PICC line in place. Patient states he started having left lower quadrant pain yesterday that got worse today. He has had nausea without vomiting, he also states he's had diarrhea. He is unaware of having fever. He denies being around anybody else is ill. Patient was just admitted to the hospital on June 7 with sepsis.  PCP Dr Vernell Morgans  Patient has a MOST form stating he wants full CPR but no feeding tube   Past Medical History:  Diagnosis Date  . Anxiety   . Chronic pain    legs, back; MRI 05/2012 with mild thoracic degenerative changes no spinal stenosis   . COPD (chronic obstructive pulmonary disease) ([place])   . Depression   . Diabetic peripheral neuropathy ([place])    "chronic" (05/27/2012)  . Diastolic CHF ([place])   . Hypercholesteremia   . Hypertension   . Peripheral edema   . PVD (peripheral vascular disease) ([place])   . Spinal stenosis    mild lumbar (MRI 05/2012)-L2-L3 to L4-L5 , mild lumbar foraminal stenosis   . Stroke ([place]) 05/2012   Subacute, lacunar infarcts within the left basal ganglia and posterior limp of the left internal capsule/thalamus; "RUE; both feet weak" (05/27/2012)  . Tremor   . Type II diabetes mellitus [facility])     Patient Active Problem List   Diagnosis Date Noted  . 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 Arterial insufficiency of lower extremity ([place])  11/15/2016  . Pressure injury of skin 11/09/2016  . Sepsis ([place]) 11/09/2016  . Osteomyelitis ([place]) 11/09/2016  . Wounds, multiple 11/08/2016  . Chronic venous insufficiency 10/12/2016  . Critical lower limb ischemia 10/12/2016  . Fatty liver 09/10/2016  . History of colonic polyps 09/10/2016  . Gallstone 09/10/2016  . Edema 03/04/2015  . AKI (acute kidney injury) ([place]) 03/04/2015  . Fall 03/04/2015  . Generalized weakness 03/04/2015  . Diabetic polyneuropathy associated with type 2 diabetes mellitus ([place])   . Anxiety   . PVD (peripheral vascular disease) ([place])   . HCAP (healthcare-associated pneumonia) 07/20/2014  . Pneumonia 07/20/2014  . Weight gain 05/02/2014  . Coarse tremors 04/18/2014  . Anxiety state 04/18/2014  . Acute hypoxemic respiratory failure ([place]) 04/07/2014  . Chronic diastolic CHF (congestive heart failure) ([place]) 04/07/2014  . Solitary pulmonary nodule 04/07/2014  . COPD with exacerbation ([place]) 04/02/2014  . Hypoxia 04/02/2014  . CVA (cerebral infarction) 06/01/2012  . Chronic pain (back, legs) 05/30/2012  . Toe laceration, 4th toe 05/30/2012  . C. difficile diarrhea 05/30/2012  . Hypokalemia 05/30/2012  . Acute lacunar stroke ([place]) 05/27/2012  . DM type 2 (diabetes mellitus, type 2) ([place]) 05/27/2012  . Smoker 05/27/2012  . HTN (hypertension) 05/27/2012    Past Surgical History:  Procedure Laterality Date  . ANKLE SURGERY    . HERNIA REPAIR  01/05/2004   "belly button" (05/27/2012)  Home Medications    Prior to Admission medications   Medication Sig Start Date End Date Taking? Authorizing Provider  albuterol (PROVENTIL) (2.5 MG/3ML) 0.083% nebulizer solution Take 2.5 mg by nebulization every 3 (three) hours as needed for wheezing or shortness of breath.    [provider]  ALPRAZolam Duanne Moron) 1 MG tablet Take 1 tablet (1 mg total) by mouth 3 (three) times daily. 03/07/15   Samuella Cota, MD  atorvastatin (LIPITOR) 20 MG tablet Take  20 mg by mouth 2 (two) times daily.     [provider]  [place] Patter 100-25 MCG/INH AEPB  09/27/16   [provider]  ceFEPIme 2 g in dextrose 5 % 50 mL Inject 2 g into the vein every 12 (twelve) hours. Monitor labs and make dose adjustments per pharmacy protocol and input. 11/11/16   Samuella Cota, MD  citalopram (CELEXA) 20 MG tablet Take 20 mg by mouth daily.    [provider]  collagenase (SANTYL) ointment Apply topically daily. Apply Santyl to left heel and right outer foot Q day, then cover with moist gauze, then kerlex and tape. 11/11/16   Samuella Cota, MD  fish oil-omega-3 fatty acids 1000 MG capsule Take 1 g by mouth 2 (two) times daily.    [provider]  furosemide (LASIX) 80 MG tablet Take 80 mg by mouth daily.    [provider]  gabapentin (NEURONTIN) 800 MG tablet Take 800 mg by mouth 3 (three) times daily.    [provider]  Cleda Clarks 100 UNIT/ML KiwkPen Per sliding scale 10/07/16   [provider]  ipratropium (ATROVENT) 0.02 % nebulizer solution Take 0.5 mg by nebulization every 6 (six) hours as needed for wheezing or shortness of breath.    [provider]  ipratropium-albuterol (DUONEB) 0.5-2.5 (3) MG/3ML SOLN Inhale 3 mLs into the lungs 3 (three) times daily.  07/09/16   [provider]  l-methylfolate-B6-B12 (METANX) 3-35-2 MG TABS tablet Take 1 tablet by mouth 2 (two) times daily.    [provider]  LANTUS SOLOSTAR 100 UNIT/ML Solostar Pen  10/29/16   [provider]  lisinopril (PRINIVIL,ZESTRIL) 5 MG tablet Take 5 mg by mouth daily.    [provider]  morphine (MS CONTIN) 15 MG 12 hr tablet Take 1 tablet (15 mg total) by mouth every 8 (eight) hours. 11/11/16   Samuella Cota, MD  oxyCODONE (OXY IR/ROXICODONE) 5 MG immediate release tablet Take 1 tablet (5 mg total) by mouth every 8 (eight) hours as needed for severe pain. 11/11/16   Samuella Cota,  MD  vancomycin (VANCOCIN) 1-5 GM/200ML-% SOLN Inject 150 mLs (750 mg total) into the vein every 12 (twelve) hours. Monitor labs and make dose adjustments per pharmacy protocol and input. 11/12/16   Samuella Cota, MD  Vitamin D, Ergocalciferol, (DRISDOL) 50000 units CAPS capsule Take 50,000 Units by mouth every 30 (thirty) days.    [provider]  white petrolatum (VASELINE) GEL Apply 1 application topically 2 (two) times daily.    [provider]    Family History Family History  Problem Relation Age of Onset  . Family history unknown: Yes    Social History Social History  Substance Use Topics  . Smoking status: Former Smoker    Packs/day: 0.50    Years: 45.00    Types: Cigarettes    Quit date: 12/28/2014  . Smokeless tobacco: Never Used  . 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    Physical Exam Updated Vital Signs ED Triage Vitals  Enc Vitals Group     BP 11/22/16 2246 96/61     Pulse Rate 11/22/16 2246 86     Resp 11/22/16 2246 20     Temp 11/22/16 2241 98 F (36.7 C)     Temp Source 11/22/16 2241 Oral     SpO2 11/22/16 2237 95 %     Weight --      Height --      Head Circumference --      Peak Flow --      Pain Score 11/22/16 2237 2     Pain Loc --      Pain Edu? --      Excl. in [place]? --    Vital signs normal Except for hypotension, at the time of my exam his blood pressure was 91/48 with heart rate 73   Physical Exam  Constitutional: He is oriented to person, place, and time. He appears well-developed and well-nourished.  Non-toxic appearance. He does not appear ill. No distress.  HENT:  Head: Normocephalic and atraumatic.  Right Ear: External ear normal.    Left Ear: External ear normal.  Nose: Nose normal. No mucosal edema or rhinorrhea.  Mouth/Throat: Oropharynx is clear and moist. Mucous membranes are dry. No dental abscesses or uvula swelling.  Eyes: Conjunctivae and EOM are normal. Pupils are equal, round, and reactive to light.  Neck: Normal range of motion and full passive range of motion without pain. Neck supple.  Cardiovascular: Normal rate, regular rhythm and normal heart sounds.  Exam reveals no gallop and no friction rub.   No murmur heard. Pulmonary/Chest: Effort normal and breath sounds normal. No respiratory distress. He has no wheezes. He has no rhonchi. He has no rales. He exhibits no tenderness and no crepitus.  Abdominal: Soft. Normal appearance. He exhibits no distension. Bowel sounds are increased. There is tenderness in the left lower quadrant. There is no rebound and no guarding.  Musculoskeletal: Normal range of motion. He exhibits no edema or tenderness.  Moves all extremities well.   Neurological: He is alert and oriented to person, place, and time. He has normal strength. No cranial nerve deficit.  Skin: Skin is warm, dry and intact. No rash noted. No erythema. No pallor.  He  has a open ulcer on the heel of his left foot that patient states is improving. He also has an ulcer over the lateral aspect of his right foot near the MTP joint of his little toe.  Psychiatric: He has a normal mood and affect. His speech is normal and behavior is normal. His mood appears not anxious.  Nursing note and vitals reviewed.   Right foot   Left foot      ED Treatments / Results  Labs (all labs ordered are listed, but only abnormal results are displayed) Results for orders placed or performed during the hospital encounter of 11/22/16  Blood Culture (routine x 2)  Result Value Ref Range   Specimen Description BLOOD LEFT ARM    Special Requests      BOTTLES DRAWN AEROBIC AND ANAEROBIC Blood Culture adequate volume  Culture  PENDING    Report Status PENDING   Blood Culture (routine x 2)  Result Value Ref Range   Specimen Description BLOOD LEFT ARM    Special Requests      Blood Culture adequate volume BOTTLES DRAWN AEROBIC AND ANAEROBIC   Culture PENDING    Report Status PENDING   Comprehensive metabolic panel  Result Value Ref Range   Sodium 138 135 - 145 mmol/L   Potassium 3.6 3.5 - 5.1 mmol/L   Chloride 103 101 - 111 mmol/L   CO2 21 (L) 22 - 32 mmol/L   Glucose, Bld 143 (H) 65 - 99 mg/dL   BUN 71 (H) 6 - 20 mg/dL   Creatinine, Ser 4.09 (H) 0.61 - 1.24 mg/dL   Calcium 8.6 (L) 8.9 - 10.3 mg/dL   Total Protein 6.4 (L) 6.5 - 8.1 g/dL   Albumin 2.7 (L) 3.5 - 5.0 g/dL   AST 88 (H) 15 - 41 U/L   ALT 116 (H) 17 - 63 U/L   Alkaline Phosphatase 333 (H) 38 - 126 U/L   Total Bilirubin 1.2 0.3 - 1.2 mg/dL   GFR calc non Af Amer 14 (L) >60 mL/min   GFR calc Af Amer 16 (L) >60 mL/min   Anion gap 14 5 - 15  CBC WITH DIFFERENTIAL  Result Value Ref Range   WBC 16.0 (H) 4.0 - 10.5 K/uL   RBC 3.62 (L) 4.22 - 5.81 MIL/uL   Hemoglobin 12.0 (L) 13.0 - 17.0 g/dL   HCT 35.8 (L) 39.0 - 52.0 %   MCV 98.9 78.0 - 100.0 fL   MCH 33.1 26.0 - 34.0 pg   MCHC 33.5 30.0 - 36.0 g/dL   RDW 16.1 (H) 11.5 - 15.5 %   Platelets 268 150 - 400 K/uL   Neutrophils Relative % 88 %   Neutro Abs 14.2 (H) 1.7 - 7.7 K/uL   Lymphocytes Relative 6 %   Lymphs Abs 0.9 0.7 - 4.0 K/uL   Monocytes Relative 5 %   Monocytes Absolute 0.7 0.1 - 1.0 K/uL   Eosinophils Relative 1 %   Eosinophils Absolute 0.1 0.0 - 0.7 K/uL   Basophils Relative 0 %   Basophils Absolute 0.0 0.0 - 0.1 K/uL  CBG monitoring, ED  Result Value Ref Range   Glucose-Capillary 126 (H) 65 - 99 mg/dL  I-Stat CG4 Lactic Acid, ED  Result Value Ref Range   Lactic Acid, Venous 0.72 0.5 - 1.9 mmol/L    Laboratory interpretation all normal except In acute renal insufficiency, malnutrition, elevation of LFTs, leukocytosis, mild anemia     EKG  EKG  Interpretation  Date/Time:  Thursday November 22 2016 22:34:35 EDT Ventricular Rate:  80 PR Interval:    QRS Duration: 106 QT Interval:  395 QTC Calculation: 456 R Axis:   -21 Text Interpretation:  Sinus rhythm Borderline left axis deviation Low voltage, precordial leads No significant change since last tracing 08 Nov 2016 Confirmed by Rolland Porter [phone]) on 11/23/2016 12:29:11 AM       Radiology  Ct Abdomen Pelvis Wo Contrast  Result Date: 11/23/2016 CLINICAL DATA:  66 year old male with nausea and abdominal discomfort. Diaphoresis. Abdominal CT dated 10/25/2016 EXAM: CT ABDOMEN AND PELVIS WITHOUT CONTRAST TECHNIQUE: Multidetector CT imaging of the abdomen and pelvis was performed following the standard protocol without IV contrast. COMPARISON:  None. FINDINGS: Evaluation of this exam is limited in the absence of intravenous contrast. Lower chest: Minimal bibasilar atelectatic changes. There is a 4  mm left lung base nodule as seen on the prior CT. No intra-abdominal free air or free fluid. Hepatobiliary: The liver is unremarkable. No intrahepatic biliary ductal dilatation. There is a 3 cm stone within the gallbladder. The gallbladder is distended. No pericholecystic fluid. Ultrasound may provide better evaluation of the gallbladder clinically indicated. Pancreas: Unremarkable. No pancreatic ductal dilatation or surrounding inflammatory changes. Spleen: Normal in size without focal abnormality. 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 The left adrenal gland is unremarkable. Bilateral renal hypodense lesions are not well characterized on this unenhanced CT but appears similar to prior study. A 2 cm hypodense lesion in the interpolar aspect of the left kidney demonstrates fluid attenuation and most likely represents a cyst. Subcentimeter exophytic right renal inferior pole lesion is not characterized. There is no hydronephrosis or nephrolithiasis on either  side. The visualized ureters appear unremarkable. The previously described thickened appearance of the left bladder wall is less appreciated on this exam. Follow-up with cystoscopy as previously recommended. Stomach/Bowel: There is sigmoid diverticulosis without active inflammatory changes. No evidence of bowel obstruction or active inflammation. Normal appendix. Vascular/Lymphatic: There is moderate aortoiliac atherosclerotic disease. Evaluation of the vasculature is limited in the absence of intravenous contrast. No portal venous gas identified. Chest mildly enlarged external iliac lymph nodes as previously described. Follow-up evaluation as per recommendations of the prior CT. Reproductive: The prostate and seminal vesicles are grossly unremarkable. Other: Small fat containing bilateral inguinal and umbilical hernia. Musculoskeletal: Degenerative changes of the spine. Lower lumbar disc desiccation with vacuum phenomena. No acute fracture. IMPRESSION: 1. No hydronephrosis or nephrolithiasis. 2. Cholelithiasis. No pericholecystic fluid. Ultrasound may provide better evaluation of the gallbladder if clinically indicated. 3. Sigmoid diverticulosis. No bowel obstruction or active inflammation. Normal appendix. 4. Aortic Atherosclerosis (ICD10-I70.0). Electronically Signed   By: Anner Crete M.D.   On: 11/23/2016 02:41      Dg Chest Port 1 View  Result Date: 11/23/2016 CLINICAL DATA:  66 year old male with diaphoresis. EXAM: PORTABLE CHEST 1 VIEW COMPARISON:  Chest radiograph dated 11/08/2016 FINDINGS: There is a right sided PICC with tip at the cavoatrial junction. The lungs are clear. There is overall interval improvement of the previously seen interstitial edema. There is no pleural effusion or pneumothorax. There is enlargement of the cardiopericardial silhouette. Atherosclerotic calcification of the aortic arch. No acute osseous pathology. IMPRESSION: 1. Cardiomegaly.  No focal consolidation. 2.  Interval improvement of the previously seen pulmonary edema. 3. Right-sided PICC with tip at the cavoatrial junction in stable positioning. Electronically Signed   By: Anner Crete M.D.   On: 11/23/2016 00:24   Dg Foot Complete Left  Result Date: 11/09/2016 CLINICAL DATA:  Lower extremity infection  IMPRESSION: Osteoarthritis of the foot. Soft tissue swelling of the ankle and foot without underlying findings to suggest osteomyelitis. Electronically Signed   By: Ashley Royalty M.D.   On: 11/09/2016 01:04   Dg Foot Complete Right  Result Date: 11/09/2016 CLINICAL DATA:  Lower extremity infection.  Oozing wounds. IMPRESSION: 1. Diffuse soft tissue swelling of the ankle and foot with bony destructive changes at the base of the right fifth proximal phalanx. There appears to be bony sequestrum along the lateral aspect of the forefoot. Findings raise concern for changes of chronic osteomyelitis. 2. Tapered appearance of the phalanges as above described which can be seen in an inflammatory arthritis such as psoriasis or rheumatoid. 3. Calcaneal enthesophyte. 4. Osteoarthritis of the mid foot and subtalar joints. Electronically  Signed   By: Ashley Royalty M.D.   On: 11/09/2016 01:00   Mr Foot Right Wo Contrast  Result Date: 11/09/2016 CLINICAL DATA:  Medical history significant of chronic wounds to ble, PVD, venous insufficiency Pt started antibiotics yesterday- DM, evaluate for osteomylitis Best obtainable images due to pt condition- no repeats tolerated  IMPRESSION: 1. Soft tissue wound along the lateral aspect of the fourth this overlying the fifth metatarsal head. Acute osteomyelitis of the fifth metatarsal head and fifth proximal phalanx. 2. Severe osteoarthritis of the first MTP joint. 3. Severe osteoarthritis of the talonavicular joint with subchondral reactive marrow edema. 4. Moderate osteoarthritis of the subtalar joints. Electronically Signed   By: Kathreen Devoid   On: 11/09/2016 11:05    Mr Foot Left Wo  Contrast  Result Date: 11/09/2016 CLINICAL DATA:  Medical history significant of chronic wounds to bilateral lower extremities, PVD, venous insufficiency Pt started antibiotics yesterday. History of DM. Evaluate for osteomyelitis.  IMPRESSION: 1. Soft tissue wound along the plantar aspect of the posterior calcaneus extending to the cortex. Small area of cortical irregularity and underlying marrow edema which may reflect reactive marrow changes versus mild osteomyelitis. No drainable fluid collection to suggest an abscess. 2. Severe osteoarthritis of the first MTP joint with subchondral reactive marrow edema. Mild osteoarthritis of the first IP joint. Electronically Signed   By: Kathreen Devoid   On: 11/09/2016 10:54    Procedures Procedures (including critical care time)  CRITICAL CARE Performed by: Camilia Caywood L Kaicee Scarpino Total critical care time: 42 minutes Critical care time was exclusive of separately billable procedures and treating other patients. Critical care was necessary to treat or prevent imminent or life-threatening deterioration. Critical care was time spent personally by me on the following activities: development of treatment plan with patient and/or surrogate as well as nursing, discussions with consultants, evaluation of patient's response to treatment, examination of patient, obtaining history from patient or surrogate, ordering and performing treatments and interventions, ordering and review of laboratory studies, ordering and review of radiographic studies, pulse oximetry and re-evaluation of patient's condition.   Medications Ordered in ED Medications  sodium chloride 0.9 % bolus 1,000 mL (0 mLs Intravenous Stopped 11/23/16 0021)    And  sodium chloride 0.9 % bolus 1,000 mL (0 mLs Intravenous Stopped 11/23/16 0045)    And  sodium chloride 0.9 % bolus 1,000 mL (1,000 mLs Intravenous Not Given 11/23/16 0119)    And  sodium chloride 0.9 % bolus 1,000 mL (0 mLs Intravenous Stopped 11/23/16  0006)  vancomycin (VANCOCIN) IVPB 1000 mg/200 mL premix (0 mg Intravenous Stopped 11/23/16 0307)  0.9 %  sodium chloride infusion ( Intravenous Stopped 11/22/16 2352)  piperacillin-tazobactam (ZOSYN) IVPB 3.375 g (0 g Intravenous Stopped 11/23/16 0154)     Initial Impression / Assessment and Plan / ED Course  I have reviewed the triage vital signs and the nursing notes.  Pertinent labs & imaging results that were available during my care of the patient were reviewed by me and considered in my medical decision making (see chart for details).  Patient was started on sepsis protocol. He was given IV fluid bolus, 4 L. His blood pressure did improve to 378 systolic.Rectal temperature shows patient did not have a fever.  Portable chest x-ray was ordered and CT of the abdomen was ordered to further evaluate his complaints of abdominal pain.  Patient's blood pressure did improve with IV fluids. He was started on IV antibiotics. At this point I am not  sure if he does have a acute infection process ongoing. However he does have a significant new acute renal insufficiency with hypotension and most likely dehydration.  Recheck at 03:00 AM patient has no tenderness in his RUQ.   03:25 AM Dr Marin Comment, hospitalist, will admit.  Final Clinical Impressions(s) / ED Diagnoses   Final diagnoses:  Nausea  Hypotension, unspecified hypotension type  Acute renal insufficiency  Dehydration  LLQ pain  Liver enzyme elevation    Plan admission  Rolland Porter, MD, Barbette Or, MD 11/23/16 [place], Ashley, MD 11/23/16 [phone]

## 2016-11-23 NOTE — Progress Notes (Addendum)
PROGRESS NOTE    Eric Scott  VOH:[id number] DOB: 02/10/1951 DOA: 11/22/2016 PCP: [name], MD    Brief Narrative:  66 year old male who presents from nursing facility due to hypotension and diaphoresis. Patient was recently discharged 11/11/2016 with diagnosis of acute osteomyelitis of the right fifth toe, left calcaneus, treated with outpatient antibiotics with IV vancomycin and cefepime. Discharge medications included furosemide 80 mg daily and lisinopril 5 mg daily. Patient was found lethargic with chills and diaphoresis along with abdominal discomfort. Positive diarrhea, severe in intensity. On initial physical examination blood pressure 91/48, heart rate 76, respiratory 13, oxygen saturation 89-95% on room air. His mucous membranes were moist, his lungs were clear to auscultation bilaterally, no wheezing rales or rhonchi, heart S1-S2 present and rhythmic, abdomen was protuberant but soft and nontender, no lower extremity edema. Sodium 138, potassium 3.6, chloride 103, bicarbonate 21, glucose 143, BUN 71, creatinine 4.09, AST 88, ALT 116, white count 16.0, hemoglobin 12.0, hematocrit 35.8, platelets 268, CT of the abdomen show no hydronephrosis or nephrolithiasis. Positive cholelithiasis, sigmoid diverticulosis. Chest x-ray with cardiomegaly, no infiltrates, PICC line with tip in the SVC right atrium junction. EKG with normal sinus rhythm.  Patient was admitted to hospital working diagnosis of hypotension, complicated by acute kidney injury and elevated liver enzymes.   Assessment & Plan:   Active Problems:   DM type 2 (diabetes mellitus, type 2) ([name])   AKI (acute kidney injury) ([place])   Gallstone   Wounds, multiple   Osteomyelitis ([name])   Volume depletion   1. Hypotension. Suspected hypovolemia due to diarrhea and poor oral intake, will target MAP greater than 65, will increase IV fluids to 100 ml per hour. Systolic blood pressure 694, with HR of 90. Old records personally  reviewed noted echocardiogram from 2015 with normal LV systolic function. No signs of recurrent sepsis, will continue on cefepime, holding vancomycin due to high levels.  2. AKI. Suspected pre-renal, but can't rule out intrinsic renal, ATN. Will increase IV fluids to 100 ml per hour of isotonic saline and will follow on renal panel today. K at 3,6 and serum bicarbonate at 21. Will check urine analysis, strict in and out, foley catheter, urine electrolytes and smear for eosinophils. Noted vanc through level up to 44 from 27. Will continue to hold vancomycin and continue to monitor levels. Patient on furosemide and lisinopril as outpatient. CT with no hydronephrosis. Check CPK.   3. Elevated liver enzymes. Possible liver shock patter, will continue supportive care and will follow on LFT in am, serum albumin 2,7. Will check inr in am.   4. Osteomyelitis. Positive osteomyelitis, will continue local wound care and cefepime.   5. Tremors. Per patient are chronic, can't rule out exacerbation due to uremia.   6. T2DM. Will hold on Holmer acting insulin and will continue glucose cover and monitoring with insulin sliding scale.    DVT prophylaxis: heparin  Code Status: full Family Communication: no family at the bedside  Disposition Plan:  Consultants:    Procedures:    Antimicrobials:   Cefepime  Vancomycin     Subjective: Patient with generalized malaise, no nausea or vomiting, no chest pain or dyspnea. Positive chronic tremors.   Objective: Vitals:   11/23/16 0500 11/23/16 0512 11/23/16 0530 11/23/16 0802  BP: 114/85  116/76 (!) 117/40  Pulse:   69 70  Resp: 13  14   Temp:  97.7 F (36.5 C)    TempSrc:  Oral    SpO2:  94% 93%    Intake/Output Summary (Last 24 hours) at 11/23/16 1114 Last data filed at 11/23/16 0006  Gross per 24 hour  Intake             2000 ml  Output                0 ml  Net             2000 ml   There were no vitals filed for this  visit.  Examination:  General exam: deconditioned, ill looking appearing E ENT: mild pallor, oral mucosa dry, no icterus.  Respiratory system: Decreased breath sounds at bases, no wheezing, rales or rhonchi. Respiratory effort normal. Cardiovascular system: S1 & S2 heard, RRR. No JVD, murmurs, rubs, gallops or clicks. No pedal edema. Gastrointestinal system: Abdomen is protuberant, nondistended, soft and nontender. No organomegaly or masses felt. Normal bowel sounds heard. Central nervous system: Alert, positive generalized tremors, generalized, mild somnolence. Able to follow commands, respond to questions, non focal.  Extremities: Symmetric 5 x 5 power. Skin: left lateral heal ulcer stage 3 with abut 3,5 cm diameter, right foot lateral front region with ulcerated wound stage 3, about 3 cm diameter.      Data Reviewed: I have personally reviewed following labs and imaging studies  CBC:  Recent Labs Lab 11/22/16 2325  WBC 16.0*  NEUTROABS 14.2*  HGB 12.0*  HCT 35.8*  MCV 98.9  PLT 672   Basic Metabolic Panel:  Recent Labs Lab 11/22/16 2325  NA 138  K 3.6  CL 103  CO2 21*  GLUCOSE 143*  BUN 71*  CREATININE 4.09*  CALCIUM 8.6*   GFR: Estimated Creatinine Clearance: 24.5 mL/min (A) (by C-G formula based on SCr of 4.09 mg/dL (H)). Liver Function Tests:  Recent Labs Lab 11/22/16 2325  AST 88*  ALT 116*  ALKPHOS 333*  BILITOT 1.2  PROT 6.4*  ALBUMIN 2.7*   No results for input(s): LIPASE, AMYLASE in the last 168 hours. No results for input(s): AMMONIA in the last 168 hours. Coagulation Profile: No results for input(s): INR, PROTIME in the last 168 hours. Cardiac Enzymes: No results for input(s): CKTOTAL, CKMB, CKMBINDEX, TROPONINI in the last 168 hours. BNP (last 3 results) No results for input(s): PROBNP in the last 8760 hours. HbA1C: No results for input(s): HGBA1C in the last 72 hours. CBG:  Recent Labs Lab 11/22/16 2247 11/23/16 0824  GLUCAP  126* 93   Lipid Profile: No results for input(s): CHOL, HDL, LDLCALC, TRIG, CHOLHDL, LDLDIRECT in the last 72 hours. Thyroid Function Tests: No results for input(s): TSH, T4TOTAL, FREET4, T3FREE, THYROIDAB in the last 72 hours. Anemia Panel: No results for input(s): VITAMINB12, FOLATE, FERRITIN, TIBC, IRON, RETICCTPCT in the last 72 hours. Sepsis Labs:  Recent Labs Lab 11/22/16 2311  LATICACIDVEN 0.72    Recent Results (from the past 240 hour(s))  Blood Culture (routine x 2)     Status: None (Preliminary result)   Collection Time: 11/22/16 11:39 PM  Result Value Ref Range Status   Specimen Description BLOOD LEFT ARM  Final   Special Requests   Final    BOTTLES DRAWN AEROBIC AND ANAEROBIC Blood Culture adequate volume   Culture NO GROWTH < 12 HOURS  Final   Report Status PENDING  Incomplete  Blood Culture (routine x 2)     Status: None (Preliminary result)   Collection Time: 11/22/16 11:46 PM  Result Value Ref Range Status   Specimen Description BLOOD LEFT ARM  Final   Special Requests   Final    Blood Culture adequate volume BOTTLES DRAWN AEROBIC AND ANAEROBIC   Culture NO GROWTH < 12 HOURS  Final   Report Status PENDING  Incomplete         Radiology Studies: Ct Abdomen Pelvis Wo Contrast  Result Date: 11/23/2016 CLINICAL DATA:  66 year old male with nausea and abdominal discomfort. Diaphoresis. Abdominal CT dated 10/25/2016 EXAM: CT ABDOMEN AND PELVIS WITHOUT CONTRAST TECHNIQUE: Multidetector CT imaging of the abdomen and pelvis was performed following the standard protocol without IV contrast. COMPARISON:  None. FINDINGS: Evaluation of this exam is limited in the absence of intravenous contrast. Lower chest: Minimal bibasilar atelectatic changes. There is a 4 mm left lung base nodule as seen on the prior CT. No intra-abdominal free air or free fluid. Hepatobiliary: The liver is unremarkable. No intrahepatic biliary ductal dilatation. There is a 3 cm stone within the  gallbladder. The gallbladder is distended. No pericholecystic fluid. Ultrasound may provide better evaluation of the gallbladder clinically indicated. Pancreas: Unremarkable. No pancreatic ductal dilatation or surrounding inflammatory changes. Spleen: Normal in size without focal abnormality. 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 The left adrenal gland is unremarkable. Bilateral renal hypodense lesions are not well characterized on this unenhanced CT but appears similar to prior study. A 2 cm hypodense lesion in the interpolar aspect of the left kidney demonstrates fluid attenuation and most likely represents a cyst. Subcentimeter exophytic right renal inferior pole lesion is not characterized. There is no hydronephrosis or nephrolithiasis on either side. The visualized ureters appear unremarkable. The previously described thickened appearance of the left bladder wall is less appreciated on this exam. Follow-up with cystoscopy as previously recommended. Stomach/Bowel: There is sigmoid diverticulosis without active inflammatory changes. No evidence of bowel obstruction or active inflammation. Normal appendix. Vascular/Lymphatic: There is moderate aortoiliac atherosclerotic disease. Evaluation of the vasculature is limited in the absence of intravenous contrast. No portal venous gas identified. Chest mildly enlarged external iliac lymph nodes as previously described. Follow-up evaluation as per recommendations of the prior CT. Reproductive: The prostate and seminal vesicles are grossly unremarkable. Other: Small fat containing bilateral inguinal and umbilical hernia. Musculoskeletal: Degenerative changes of the spine. Lower lumbar disc desiccation with vacuum phenomena. No acute fracture. IMPRESSION: 1. No hydronephrosis or nephrolithiasis. 2. Cholelithiasis. No pericholecystic fluid. Ultrasound may provide better evaluation of the gallbladder if clinically  indicated. 3. Sigmoid diverticulosis. No bowel obstruction or active inflammation. Normal appendix. 4. Aortic Atherosclerosis (ICD10-I70.0). Electronically Signed   By: Anner Crete M.D.   On: 11/23/2016 02:41   Dg Chest Port 1 View  Result Date: 11/23/2016 CLINICAL DATA:  66 year old male with diaphoresis. EXAM: PORTABLE CHEST 1 VIEW COMPARISON:  Chest radiograph dated 11/08/2016 FINDINGS: There is a right sided PICC with tip at the cavoatrial junction. The lungs are clear. There is overall interval improvement of the previously seen interstitial edema. There is no pleural effusion or pneumothorax. There is enlargement of the cardiopericardial silhouette. Atherosclerotic calcification of the aortic arch. No acute osseous pathology. IMPRESSION: 1. Cardiomegaly.  No focal consolidation. 2. Interval improvement of the previously seen pulmonary edema. 3. Right-sided PICC with tip at the cavoatrial junction in stable positioning. Electronically Signed   By: Anner Crete M.D.   On: 11/23/2016 00:24        Scheduled Meds: . ALPRAZolam  1 mg Oral TID  . atorvastatin  20 mg Oral BID  . citalopram  20 mg  Oral Daily  . collagenase   Topical Daily  . feeding supplement (ENSURE ENLIVE)  237 mL Oral BID BM  . fluticasone furoate-vilanterol  1 puff Inhalation Daily  . heparin  5,000 Units Subcutaneous Q8H  . insulin aspart  0-5 Units Subcutaneous QHS  . insulin aspart  0-9 Units Subcutaneous TID WC  . ipratropium-albuterol  3 mL Inhalation TID   Continuous Infusions: . sodium chloride 75 mL/hr at 11/23/16 0810  . [START ON 11/24/2016] ceFEPime (MAXIPIME) IV       LOS: 0 days       Jaden Batchelder Gerome Apley, MD [facility] Pager [phone]  If 7PM-7AM, please contact night-coverage [email or web address] Password [facility] 11/23/2016, 11:14 AM

## 2016-11-23 NOTE — Progress Notes (Addendum)
ANTIBIOTIC CONSULT NOTE-Preliminary  Pharmacy Consult for vancomycin, cefepime Indication: sepsis  Allergies  Allergen Reactions  . Blueberry Flavor   . Cucumber Extract Other (See Comments)    "Fells like I'm having a heart attack"  . Flexeril [Cyclobenzaprine Hcl] Other (See Comments)    "whole body  Tremors" (05/27/2012)  . Kiwi Extract Other (See Comments)    "feels like I'm having a heart attack"    Patient Measurements:   Adjusted Body Weight:   Vital Signs: Temp: 97.7 F (36.5 C) (06/22 0512) Temp Source: Oral (06/22 0512) BP: 116/76 (06/22 0530) Pulse Rate: 69 (06/22 0530)  Labs:  Recent Labs  11/22/16 2325  WBC 16.0*  HGB 12.0*  PLT 268  CREATININE 4.09*    Estimated Creatinine Clearance: 24.5 mL/min (A) (by C-G formula based on SCr of 4.09 mg/dL (H)).  No results for input(s): VANCOTROUGH, VANCOPEAK, VANCORANDOM, GENTTROUGH, GENTPEAK, GENTRANDOM, TOBRATROUGH, TOBRAPEAK, TOBRARND, AMIKACINPEAK, AMIKACINTROU, AMIKACIN in the last 72 hours.   Microbiology: Recent Results (from the past 720 hour(s))  Urine Culture     Status: Abnormal   Collection Time: 11/08/16  8:20 PM  Result Value Ref Range Status   Specimen Description URINE, CATHETERIZED  Final   Special Requests NONE  Final   Culture 20,000 COLONIES/mL ENTEROCOCCUS FAECALIS (A)  Final   Report Status 11/11/2016 FINAL  Final   Organism ID, Bacteria ENTEROCOCCUS FAECALIS (A)  Final      Susceptibility   Enterococcus faecalis - MIC*    AMPICILLIN <=2 SENSITIVE Sensitive     LEVOFLOXACIN >=8 RESISTANT Resistant     NITROFURANTOIN <=16 SENSITIVE Sensitive     VANCOMYCIN 1 SENSITIVE Sensitive     * 20,000 COLONIES/mL ENTEROCOCCUS FAECALIS  Blood culture (routine x 2)     Status: None   Collection Time: 11/08/16  8:35 PM  Result Value Ref Range Status   Specimen Description LEFT ANTECUBITAL  Final   Special Requests   Final    BOTTLES DRAWN AEROBIC AND ANAEROBIC Blood Culture results may not be  optimal due to an inadequate volume of blood received in culture bottles   Culture NO GROWTH 5 DAYS  Final   Report Status 11/13/2016 FINAL  Final  Blood culture (routine x 2)     Status: None   Collection Time: 11/08/16  8:35 PM  Result Value Ref Range Status   Specimen Description BLOOD LEFT HAND  Final   Special Requests   Final    BOTTLES DRAWN AEROBIC AND ANAEROBIC Blood Culture results may not be optimal due to an inadequate volume of blood received in culture bottles   Culture NO GROWTH 5 DAYS  Final   Report Status 11/13/2016 FINAL  Final  C difficile quick scan w PCR reflex     Status: None   Collection Time: 11/09/16  1:25 AM  Result Value Ref Range Status   C Diff antigen NEGATIVE NEGATIVE Final   C Diff toxin NEGATIVE NEGATIVE Final   C Diff interpretation No C. difficile detected.  Final  MRSA PCR Screening     Status: Abnormal   Collection Time: 11/09/16  1:30 AM  Result Value Ref Range Status   MRSA by PCR POSITIVE (A) NEGATIVE Final    Comment:        The GeneXpert MRSA Assay (FDA approved for NASAL specimens only), is one component of a comprehensive MRSA colonization surveillance program. It is not intended to diagnose MRSA infection nor to guide or monitor treatment for MRSA  infections. RESULT CALLED TO, READ BACK BY AND VERIFIED WITH: WAGNOR,R AT 0330 ON 6.8.2018 BY ISLEY,B   Blood Culture (routine x 2)     Status: None (Preliminary result)   Collection Time: 11/22/16 11:39 PM  Result Value Ref Range Status   Specimen Description BLOOD LEFT ARM  Final   Special Requests   Final    BOTTLES DRAWN AEROBIC AND ANAEROBIC Blood Culture adequate volume   Culture PENDING  Incomplete   Report Status PENDING  Incomplete  Blood Culture (routine x 2)     Status: None (Preliminary result)   Collection Time: 11/22/16 11:46 PM  Result Value Ref Range Status   Specimen Description BLOOD LEFT ARM  Final   Special Requests   Final    Blood Culture adequate volume  BOTTLES DRAWN AEROBIC AND ANAEROBIC   Culture PENDING  Incomplete   Report Status PENDING  Incomplete    Medical History: Past Medical History:  Diagnosis Date  . Anxiety   . Chronic pain    legs, back; MRI 05/2012 with mild thoracic degenerative changes no spinal stenosis   . COPD (chronic obstructive pulmonary disease) ([place])   . Depression   . Diabetic peripheral neuropathy ([place])    "chronic" (05/27/2012)  . Diastolic CHF ([place])   . Hypercholesteremia   . Hypertension   . Peripheral edema   . PVD (peripheral vascular disease) ([place])   . Spinal stenosis    mild lumbar (MRI 05/2012)-L2-L3 to L4-L5 , mild lumbar foraminal stenosis   . Stroke ([place]) 05/2012   Subacute, lacunar infarcts within the left basal ganglia and posterior limp of the left internal capsule/thalamus; "RUE; both feet weak" (05/27/2012)  . Tremor   . Type II diabetes mellitus ([name])     Medications:  Infusions:  . ceFEPIme (MAXIPIME) 2 GM IVP Stopped (11/23/16 0534)   PRN:  Anti-infectives    Start     Dose/Rate Route Frequency Ordered Stop   11/23/16 0500  ceFEPIme (MAXIPIME) 2 g in dextrose 5 % 50 mL IVPB    Comments:  Monitor labs and make dose adjustments per pharmacy protocol and input.     2 g 100 mL/hr over 30 Minutes Intravenous Every 12 hours 11/23/16 0449     11/23/16 0230  vancomycin (VANCOCIN) IVPB 1000 mg/200 mL premix     1,000 mg 200 mL/hr over 60 Minutes Intravenous Every 1 hr x 2 11/23/16 0131 11/23/16 0556   11/23/16 0130  piperacillin-tazobactam (ZOSYN) IVPB 3.375 g     3.375 g 100 mL/hr over 30 Minutes Intravenous  Once 11/23/16 0119 11/23/16 0154   11/23/16 0130  vancomycin (VANCOCIN) IVPB 1000 mg/200 mL premix  Status:  Discontinued     1,000 mg 200 mL/hr over 60 Minutes Intravenous  Once 11/23/16 0119 11/23/16 0131      Assessment: 66 yo male starting vancomycin and zosyn for sepsis.   Goal of Therapy:  Vancomycin trough level 15-20 mcg/ml  Plan:  Preliminary review of  pertinent patient information completed.  Protocol will be initiated with dose(s) of vancomycin 2 gram loading dose (changed from 1 gram originally entered in ED).  Forestine Na clinical pharmacist will complete review during morning rounds to assess patient and finalize treatment regimen if needed.  [place], Tiffany Scarlett, RPH 11/23/2016,6:54 AM   Addum:  Vancomycin and cefepime were continued from home.  Patient received 2gm loading dose earlier today.  Random vanc level was 44 mg/L.  Will continue cefepime 1gm IV q24 hours.  Recheck vanc level tomorrow evening and redose when level <20 mg/L.  Thank you, Excell Seltzer, PharmD

## 2016-11-23 NOTE — Clinical Social Work Note (Signed)
Clinical Social Work Assessment  Patient Details  Name: Eric Scott MRN: [id number] Date of Birth: March 09, 1951  Date of referral:  11/23/16               Reason for consult:  Discharge Planning                Permission sought to share information with:  Case Manager, Facility Sport and exercise psychologist, Family Supports Permission granted to share information::  Yes, Verbal Permission Granted  Name::        Agency::     Relationship::  son and daughter in law: Photographer Information:     Housing/Transportation Living arrangements for the past 2 months:  [place] of Information:  Patient, Scientist, water quality, Tourist information centre manager, Adult Children, Engineer, materials, Facility Patient Interpreter Needed:  None Criminal Activity/Legal Involvement Pertinent to Current Situation/Hospitalization:  No - Comment as needed Significant Relationships:  Adult Children, Other Family Members, Community Support Lives with:  Facility Resident Do you feel safe going back to the place where you live?  Yes Need for family participation in patient care:  Yes (Comment)  Care giving concerns:  No concerns noted at this time. Patient admitted from [facility] and receiving care and IV antibiotics.  Call placed to [facility] as listed in his chart. Updated about care and no concerns noted.    Social Worker assessment / plan:  LCSW has completed assessment with family. Patient responding to voice only.  Facility has been called and not returned call back. FL2 updated.  Plan at this time return once medically stable.  Employment status:  Disabled (Comment on whether or not currently receiving Disability) Insurance information:  Managed Medicare PT Recommendations:  Not assessed at this time Information / Referral to community resources:  [place]  Patient/Family's Response to care:  Still assessing  Patient/Family's Understanding of and Emotional Response to Diagnosis, Current  Treatment, and Prognosis:  Limited emotional response except that Vaughan Basta was out of town.  Reports she is aware of his current illnesses and that he may lose a leg because of his infections.  Emotional Assessment Appearance:    Attitude/Demeanor/Rapport:    Affect (typically observed):  Accepting Orientation:  Oriented to Self, Oriented to Place Alcohol / Substance use:  Not Applicable Psych involvement (Current and /or in the community):  No (Comment)  Discharge Needs  Concerns to be addressed:  No discharge needs identified Readmission within the last 30 days:  No Current discharge risk:  None Barriers to Discharge:  No Barriers Identified   [place], LCSW 11/23/2016, 11:27 AM

## 2016-11-23 NOTE — Care Management Note (Signed)
Case Management Note  Patient Details  Name: Eric Scott MRN: [id number] Date of Birth: 1951-05-13  Subjective/Objective:                  Pt coming from [facility]. Chart reviewed for CM needs. No CM needs anticipated.   Action/Plan: Plan for return to [facility] at DC. CSW will make arrangements for return to facility.   Expected Discharge Date:       11/24/2016           Expected Discharge Plan:  [place]  In-House Referral:  Clinical Social Work  Discharge planning Services  CM Consult  Post Acute Care Choice:  NA Choice offered to:  NA  Status of Service:  Completed, signed off  Sherald Barge, RN 11/23/2016, 8:14 AM

## 2016-11-23 NOTE — Progress Notes (Signed)
Nutrition Brief Note  Patient identified on the Malnutrition Screening Tool (MST) Report  Wt Readings from Last 15 Encounters:  11/23/16 276 lb (125.2 kg)  11/15/16 280 lb (127 kg)  11/10/16 282 lb 13.6 oz (128.3 kg)  10/12/16 280 lb (127 kg)  09/10/16 299 lb (135.6 kg)  05/29/16 300 lb (136.1 kg)  03/04/15 (!) 315 lb 0.6 oz (142.9 kg)  01/28/15 295 lb (133.8 kg)  08/30/14 280 lb (127 kg)  07/25/14 277 lb 15.3 oz (126.1 kg)  05/20/14 282 lb (127.9 kg)  05/20/14 282 lb (127.9 kg)  04/04/14 289 lb 14.5 oz (131.5 kg)  07/16/13 270 lb (122.5 kg)  02/17/13 250 lb (113.4 kg)   Body mass index is 37.43 kg/m. Patient meets criteria for Obese based on current BMI.   Pt attempted to be seen today due to MST report of unintentional wt loss and decreased appetite.   RD attempted to speak with patient, but did not respond to calling name loudly or sternal rub. Unable to arouse at this time.   Bed weight noted as 276 lbs, but was volume depleted on admission. Current diet order is Carb Mod, patient is consuming approximately 0-10%% of meals at this time, likely secondary to his lethargy, suspect nutrition will improve as he becomes more alert  No nutrition interventions warranted at this time. If nutrition issues arise, please consult RD.   Burtis Junes RD, LDN, CNSC Clinical Nutrition Pager: [phone] 11/23/2016 4:08 PM

## 2016-11-23 NOTE — ED Notes (Signed)
Pt gone for CT 

## 2016-11-24 ENCOUNTER — Inpatient Hospital Stay (HOSPITAL_COMMUNITY): Payer: Medicare Other

## 2016-11-24 DIAGNOSIS — R740 Nonspecific elevation of levels of transaminase and lactic acid dehydrogenase [LDH]: Secondary | ICD-10-CM

## 2016-11-24 DIAGNOSIS — E1140 Type 2 diabetes mellitus with diabetic neuropathy, unspecified: Secondary | ICD-10-CM

## 2016-11-24 DIAGNOSIS — K8020 Calculus of gallbladder without cholecystitis without obstruction: Secondary | ICD-10-CM

## 2016-11-24 LAB — HEPATIC FUNCTION PANEL
ALK PHOS: 454 U/L — AB (ref 38–126)
ALT: 173 U/L — AB (ref 17–63)
AST: 214 U/L — AB (ref 15–41)
Albumin: 2.7 g/dL — ABNORMAL LOW (ref 3.5–5.0)
BILIRUBIN DIRECT: 0.9 mg/dL — AB (ref 0.1–0.5)
Indirect Bilirubin: 0.9 mg/dL (ref 0.3–0.9)
Total Bilirubin: 1.8 mg/dL — ABNORMAL HIGH (ref 0.3–1.2)
Total Protein: 6.8 g/dL (ref 6.5–8.1)

## 2016-11-24 LAB — BASIC METABOLIC PANEL
ANION GAP: 10 (ref 5–15)
BUN: 58 mg/dL — ABNORMAL HIGH (ref 6–20)
CO2: 21 mmol/L — ABNORMAL LOW (ref 22–32)
Calcium: 8.1 mg/dL — ABNORMAL LOW (ref 8.9–10.3)
Chloride: 112 mmol/L — ABNORMAL HIGH (ref 101–111)
Creatinine, Ser: 3.07 mg/dL — ABNORMAL HIGH (ref 0.61–1.24)
GFR calc Af Amer: 23 mL/min — ABNORMAL LOW (ref >60)
GFR, EST NON AFRICAN AMERICAN: 20 mL/min — AB (ref >60)
Glucose, Bld: 113 mg/dL — ABNORMAL HIGH (ref 65–99)
POTASSIUM: 3 mmol/L — AB (ref 3.5–5.1)
SODIUM: 143 mmol/L (ref 135–145)

## 2016-11-24 LAB — CBC
HEMATOCRIT: 31.4 % — AB (ref 39.0–52.0)
HEMOGLOBIN: 10.6 g/dL — AB (ref 13.0–17.0)
MCH: 33.2 pg (ref 26.0–34.0)
MCHC: 33.8 g/dL (ref 30.0–36.0)
MCV: 98.4 fL (ref 78.0–100.0)
Platelets: 210 10*3/uL (ref 150–400)
RBC: 3.19 MIL/uL — ABNORMAL LOW (ref 4.22–5.81)
RDW: 16.1 % — ABNORMAL HIGH (ref 11.5–15.5)
WBC: 8.5 10*3/uL (ref 4.0–10.5)

## 2016-11-24 LAB — GLUCOSE, CAPILLARY
GLUCOSE-CAPILLARY: 81 mg/dL (ref 65–99)
GLUCOSE-CAPILLARY: 163 mg/dL — AB (ref 65–99)
GLUCOSE-CAPILLARY: 138 mg/dL — AB (ref 65–99)
Glucose-Capillary: 77 mg/dL (ref 65–99)
Glucose-Capillary: 89 mg/dL (ref 65–99)

## 2016-11-24 LAB — VANCOMYCIN, RANDOM: VANCOMYCIN RM: 34

## 2016-11-24 LAB — VANCOMYCIN, TROUGH: Vancomycin Tr: 32 ug/mL (ref 15–20)

## 2016-11-24 MED ORDER — SODIUM CHLORIDE 0.45 % IV SOLN
INTRAVENOUS | Status: DC
  Administered 2016-11-24 – 2016-11-28 (×9): via INTRAVENOUS

## 2016-11-24 MED ORDER — POTASSIUM CHLORIDE 20 MEQ PO PACK
20.0000 meq | PACK | Freq: Once | ORAL | Status: AC
  Administered 2016-11-24: 20 meq via ORAL
  Filled 2016-11-24: qty 1

## 2016-11-24 NOTE — Progress Notes (Signed)
ANTIBIOTIC CONSULT NOTE-  Pharmacy Consult for vancomycin, cefepime Indication: sepsis  Allergies  Allergen Reactions  . Blueberry Flavor   . Cucumber Extract Other (See Comments)    "Fells like I'm having a heart attack"  . Flexeril [Cyclobenzaprine Hcl] Other (See Comments)    "whole body  Tremors" (05/27/2012)  . Kiwi Extract Other (See Comments)    "feels like I'm having a heart attack"    Patient Measurements: Weight: 276 lb (125.2 kg) Adjusted Body Weight:   Vital Signs: Temp: 97.9 F (36.6 C) (06/23 1300) Temp Source: Oral (06/23 1300) BP: 129/49 (06/23 1300) Pulse Rate: 67 (06/23 1300)  Labs:  Recent Labs  11/22/16 2325 11/23/16 1539 11/23/16 1550 11/24/16 1111  WBC 16.0*  --   --  8.5  HGB 12.0*  --   --  10.6*  PLT 268  --   --  210  LABCREA  --   --  57.02  --   CREATININE 4.09* 3.81*  --  3.07*    Estimated Creatinine Clearance: 32.3 mL/min (A) (by C-G formula based on SCr of 3.07 mg/dL (H)).   Recent Labs  11/23/16 0922 11/24/16 0609 11/24/16 1704  VANCOTROUGH 44*  --  31*  VANCORANDOM  --  34  --      Microbiology: Recent Results (from the past 720 hour(s))  Urine Culture     Status: Abnormal   Collection Time: 11/08/16  8:20 PM  Result Value Ref Range Status   Specimen Description URINE, CATHETERIZED  Final   Special Requests NONE  Final   Culture 20,000 COLONIES/mL ENTEROCOCCUS FAECALIS (A)  Final   Report Status 11/11/2016 FINAL  Final   Organism ID, Bacteria ENTEROCOCCUS FAECALIS (A)  Final      Susceptibility   Enterococcus faecalis - MIC*    AMPICILLIN <=2 SENSITIVE Sensitive     LEVOFLOXACIN >=8 RESISTANT Resistant     NITROFURANTOIN <=16 SENSITIVE Sensitive     VANCOMYCIN 1 SENSITIVE Sensitive     * 20,000 COLONIES/mL ENTEROCOCCUS FAECALIS  Blood culture (routine x 2)     Status: None   Collection Time: 11/08/16  8:35 PM  Result Value Ref Range Status   Specimen Description LEFT ANTECUBITAL  Final   Special Requests    Final    BOTTLES DRAWN AEROBIC AND ANAEROBIC Blood Culture results may not be optimal due to an inadequate volume of blood received in culture bottles   Culture NO GROWTH 5 DAYS  Final   Report Status 11/13/2016 FINAL  Final  Blood culture (routine x 2)     Status: None   Collection Time: 11/08/16  8:35 PM  Result Value Ref Range Status   Specimen Description BLOOD LEFT HAND  Final   Special Requests   Final    BOTTLES DRAWN AEROBIC AND ANAEROBIC Blood Culture results may not be optimal due to an inadequate volume of blood received in culture bottles   Culture NO GROWTH 5 DAYS  Final   Report Status 11/13/2016 FINAL  Final  C difficile quick scan w PCR reflex     Status: None   Collection Time: 11/09/16  1:25 AM  Result Value Ref Range Status   C Diff antigen NEGATIVE NEGATIVE Final   C Diff toxin NEGATIVE NEGATIVE Final   C Diff interpretation No C. difficile detected.  Final  MRSA PCR Screening     Status: Abnormal   Collection Time: 11/09/16  1:30 AM  Result Value Ref Range Status  MRSA by PCR POSITIVE (A) NEGATIVE Final    Comment:        The GeneXpert MRSA Assay (FDA approved for NASAL specimens only), is one component of a comprehensive MRSA colonization surveillance program. It is not intended to diagnose MRSA infection nor to guide or monitor treatment for MRSA infections. RESULT CALLED TO, READ BACK BY AND VERIFIED WITH: WAGNOR,R AT 0330 ON 6.8.2018 BY ISLEY,B   Blood Culture (routine x 2)     Status: None (Preliminary result)   Collection Time: 11/22/16 11:39 PM  Result Value Ref Range Status   Specimen Description BLOOD LEFT ARM  Final   Special Requests   Final    BOTTLES DRAWN AEROBIC AND ANAEROBIC Blood Culture adequate volume   Culture NO GROWTH < 12 HOURS  Final   Report Status PENDING  Incomplete  Blood Culture (routine x 2)     Status: None (Preliminary result)   Collection Time: 11/22/16 11:46 PM  Result Value Ref Range Status   Specimen Description  BLOOD LEFT ARM  Final   Special Requests   Final    Blood Culture adequate volume BOTTLES DRAWN AEROBIC AND ANAEROBIC   Culture NO GROWTH < 12 HOURS  Final   Report Status PENDING  Incomplete    Medical History: Past Medical History:  Diagnosis Date  . Anxiety   . Chronic pain    legs, back; MRI 05/2012 with mild thoracic degenerative changes no spinal stenosis   . COPD (chronic obstructive pulmonary disease) ([place])   . Depression   . Diabetic peripheral neuropathy ([place])    "chronic" (05/27/2012)  . Diastolic CHF ([place])   . Hypercholesteremia   . Hypertension   . Peripheral edema   . PVD (peripheral vascular disease) ([place])   . Spinal stenosis    mild lumbar (MRI 05/2012)-L2-L3 to L4-L5 , mild lumbar foraminal stenosis   . Stroke ([place]) 05/2012   Subacute, lacunar infarcts within the left basal ganglia and posterior limp of the left internal capsule/thalamus; "RUE; both feet weak" (05/27/2012)  . Tremor   . Type II diabetes mellitus ([name])     Medications:  Infusions:  . sodium chloride 100 mL/hr at 11/24/16 1447  . ceFEPime (MAXIPIME) IV 1 g (11/24/16 0512)   PRN:  Anti-infectives    Start     Dose/Rate Route Frequency Ordered Stop   11/24/16 0500  ceFEPIme (MAXIPIME) 1 g in dextrose 5 % 50 mL IVPB     1 g 100 mL/hr over 30 Minutes Intravenous Every 24 hours 11/23/16 0752     11/23/16 0500  ceFEPIme (MAXIPIME) 2 g in dextrose 5 % 50 mL IVPB  Status:  Discontinued    Comments:  Monitor labs and make dose adjustments per pharmacy protocol and input.     2 g 100 mL/hr over 30 Minutes Intravenous Every 12 hours 11/23/16 0449 11/23/16 0752   11/23/16 0230  vancomycin (VANCOCIN) IVPB 1000 mg/200 mL premix     1,000 mg 200 mL/hr over 60 Minutes Intravenous Every 1 hr x 2 11/23/16 0131 11/23/16 0556   11/23/16 0130  piperacillin-tazobactam (ZOSYN) IVPB 3.375 g     3.375 g 100 mL/hr over 30 Minutes Intravenous  Once 11/23/16 0119 11/23/16 0154   11/23/16 0130  vancomycin  (VANCOCIN) IVPB 1000 mg/200 mL premix  Status:  Discontinued     1,000 mg 200 mL/hr over 60 Minutes Intravenous  Once 11/23/16 0119 11/23/16 0131      Assessment: 66 yo male continuing  vancocomycin and cefepime for sepsis. Vanc level remains elevated  Goal of Therapy:  Vancomycin trough level 15-20 mcg/ml  Plan:  Continue cefepime 1gm IV q24 hours Cont to hold vancomycin  Beverlee Nims, RPH 11/24/2016,7:53 PM

## 2016-11-24 NOTE — Progress Notes (Signed)
PROGRESS NOTE    Eric Scott  WER:[id number] DOB: [birth year] DOA: 11/22/2016 PCP: [name], MD    Brief Narrative:  66 year old male who presents from nursing facility due to hypotension and diaphoresis. Patient was recently discharged 11/11/2016 with diagnosis of acute osteomyelitis of the right fifth toe, left calcaneus, treated with outpatient antibiotics with IV vancomycin and cefepime. Discharge medications included furosemide 80 mg daily and lisinopril 5 mg daily. Patient was found lethargic with chills and diaphoresis along with abdominal discomfort. Positive diarrhea, severe in intensity. On initial physical examination blood pressure 91/48, heart rate 76, respiratory 13, oxygen saturation 89-95% on room air. His mucous membranes were moist, his lungs were clear to auscultation bilaterally, no wheezing rales or rhonchi, heart S1-S2 present and rhythmic, abdomen was protuberant but soft and nontender, no lower extremity edema. Sodium 138, potassium 3.6, chloride 103, bicarbonate 21, glucose 143, BUN 71, creatinine 4.09, AST 88, ALT 116, white count 16.0, hemoglobin 12.0, hematocrit 35.8, platelets 268, CT of the abdomen show no hydronephrosis or nephrolithiasis. Positive cholelithiasis, sigmoid diverticulosis. Chest x-ray with cardiomegaly, no infiltrates, PICC line with tip in the SVC right atrium junction. EKG with normal sinus rhythm.  Patient was admitted to hospital working diagnosis of hypotension, complicated by acute kidney injury and elevated liver enzymes.   Assessment & Plan:   Active Problems:   DM type 2 (diabetes mellitus, type 2) ([name])   AKI (acute kidney injury) ([place])   Gallstone   Wounds, multiple   Osteomyelitis ([name])   Volume depletion   Hypotension   Liver enzyme elevation   1. Hypotension due to volume depletion. Blood pressure with systolic 99 to 195, MAP greater than 65, will continue with isotonic solutions IV at 100 ml per hour and will bolus as  needed, patient reports improved diarrhea. No nausea or vomiting. No signs of sepsis.   2. AKI. Urine output up to 1,625 over last 24 hours, will continue hydration with saline at 100 ml per hour, continue to monitor urine output. No signs of volume overload. Urinary Na up to 71 with osmolality of 308. Vancomycin level down to 34. Follow bmp with serum bicarbonate at 21 and K at 3,6, with cr at 3,81.   3. Elevated liver enzymes. Worsening elevation of liver enzymes and bilirubins, no abdominal pain, Murphy sign negative, CT with gallstones. Will check right upper quadrant [place], follow on LFT's in am.   4. Osteomyelitis. Positive osteomyelitis at the left and right foot, old records personally reviewed, orthopedic follow up with Dr. Sharol Given on 06/14, with plan for amputation, fifth ray on the right and transtibial on the left. Currently on cefepime, will continue to hold on vancomycin for now, when subtherapeutic levels (<20) may consider daptomycin, to prevent further renal injury.   5. Tremors. Per patient are chronic, clinically less severe today.  6. T2DM. Capillary glucose 93, 98, 89, 77. To cotinue glucose cover and monitoring with insulin sliding scale. Patient with good po toleration.   DVT prophylaxis: heparin  Code Status: full Family Communication: no family at the bedside  Disposition Plan:  Consultants:    Procedures:    Antimicrobials:   Cefepime  Vancomycin    Subjective: Patient feeling better, no chest pain or dyspnea, improved diarrhea. No fever or chills.   Objective: Vitals:   11/23/16 2134 11/24/16 0642 11/24/16 0814 11/24/16 0818  BP: (!) 99/53 (!) 112/59    Pulse: 74 (!) 52    Resp: 18 18  Temp:      TempSrc:      SpO2: 100% 100% 97% 97%  Weight:        Intake/Output Summary (Last 24 hours) at 11/24/16 0949 Last data filed at 11/24/16 0900  Gross per 24 hour  Intake          2122.08 ml  Output             1625 ml  Net           497.08 ml     Filed Weights   11/23/16 1608  Weight: 125.2 kg (276 lb)    Examination:  General exam: deconditioned E ENT: mild pallor, oral mucosa moist, no icterus.  Respiratory system: Clear to auscultation. Respiratory effort normal. No wheezing, rales or rhonchi.  Cardiovascular system: S1 & S2 heard, RRR. No JVD, murmurs, rubs, gallops or clicks. No pedal edema. Gastrointestinal system: Abdomen protuberant and distended, soft and nontender. No organomegaly or masses felt. Normal bowel sounds heard. Central nervous system: Alert and oriented. No focal neurological deficits. Extremities: Symmetric 5 x 5 power. Skin: No rashes, lesions or ulcers.     Data Reviewed: I have personally reviewed following labs and imaging studies  CBC:  Recent Labs Lab 11/22/16 2325  WBC 16.0*  NEUTROABS 14.2*  HGB 12.0*  HCT 35.8*  MCV 98.9  PLT 017   Basic Metabolic Panel:  Recent Labs Lab 11/22/16 2325 11/23/16 1539  NA 138 139  K 3.6 3.6  CL 103 108  CO2 21* 21*  GLUCOSE 143* 99  BUN 71* 65*  CREATININE 4.09* 3.81*  CALCIUM 8.6* 8.1*   GFR: Estimated Creatinine Clearance: 26.1 mL/min (A) (by C-G formula based on SCr of 3.81 mg/dL (H)). Liver Function Tests:  Recent Labs Lab 11/22/16 2325 11/24/16 0609  AST 88* 214*  ALT 116* 173*  ALKPHOS 333* 454*  BILITOT 1.2 1.8*  PROT 6.4* 6.8  ALBUMIN 2.7* 2.7*   No results for input(s): LIPASE, AMYLASE in the last 168 hours. No results for input(s): AMMONIA in the last 168 hours. Coagulation Profile: No results for input(s): INR, PROTIME in the last 168 hours. Cardiac Enzymes:  Recent Labs Lab 11/23/16 0922  CKTOTAL 20*   BNP (last 3 results) No results for input(s): PROBNP in the last 8760 hours. HbA1C: No results for input(s): HGBA1C in the last 72 hours. CBG:  Recent Labs Lab 11/23/16 0824 11/23/16 1130 11/23/16 1659 11/24/16 0059 11/24/16 0812  GLUCAP 93 98 89 77 81   Lipid Profile: No results for input(s):  CHOL, HDL, LDLCALC, TRIG, CHOLHDL, LDLDIRECT in the last 72 hours. Thyroid Function Tests: No results for input(s): TSH, T4TOTAL, FREET4, T3FREE, THYROIDAB in the last 72 hours. Anemia Panel: No results for input(s): VITAMINB12, FOLATE, FERRITIN, TIBC, IRON, RETICCTPCT in the last 72 hours. Sepsis Labs:  Recent Labs Lab 11/22/16 2311  LATICACIDVEN 0.72    Recent Results (from the past 240 hour(s))  Blood Culture (routine x 2)     Status: None (Preliminary result)   Collection Time: 11/22/16 11:39 PM  Result Value Ref Range Status   Specimen Description BLOOD LEFT ARM  Final   Special Requests   Final    BOTTLES DRAWN AEROBIC AND ANAEROBIC Blood Culture adequate volume   Culture NO GROWTH < 12 HOURS  Final   Report Status PENDING  Incomplete  Blood Culture (routine x 2)     Status: None (Preliminary result)   Collection Time: 11/22/16 11:46 PM  Result Value  Ref Range Status   Specimen Description BLOOD LEFT ARM  Final   Special Requests   Final    Blood Culture adequate volume BOTTLES DRAWN AEROBIC AND ANAEROBIC   Culture NO GROWTH < 12 HOURS  Final   Report Status PENDING  Incomplete         Radiology Studies: Ct Abdomen Pelvis Wo Contrast  Result Date: 11/23/2016 CLINICAL DATA:  66 year old male with nausea and abdominal discomfort. Diaphoresis. Abdominal CT dated 10/25/2016 EXAM: CT ABDOMEN AND PELVIS WITHOUT CONTRAST TECHNIQUE: Multidetector CT imaging of the abdomen and pelvis was performed following the standard protocol without IV contrast. COMPARISON:  None. FINDINGS: Evaluation of this exam is limited in the absence of intravenous contrast. Lower chest: Minimal bibasilar atelectatic changes. There is a 4 mm left lung base nodule as seen on the prior CT. No intra-abdominal free air or free fluid. Hepatobiliary: The liver is unremarkable. No intrahepatic biliary ductal dilatation. There is a 3 cm stone within the gallbladder. The gallbladder is distended. No  pericholecystic fluid. Ultrasound may provide better evaluation of the gallbladder clinically indicated. Pancreas: Unremarkable. No pancreatic ductal dilatation or surrounding inflammatory changes. Spleen: Normal in size without focal abnormality. 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 The left adrenal gland is unremarkable. Bilateral renal hypodense lesions are not well characterized on this unenhanced CT but appears similar to prior study. A 2 cm hypodense lesion in the interpolar aspect of the left kidney demonstrates fluid attenuation and most likely represents a cyst. Subcentimeter exophytic right renal inferior pole lesion is not characterized. There is no hydronephrosis or nephrolithiasis on either side. The visualized ureters appear unremarkable. The previously described thickened appearance of the left bladder wall is less appreciated on this exam. Follow-up with cystoscopy as previously recommended. Stomach/Bowel: There is sigmoid diverticulosis without active inflammatory changes. No evidence of bowel obstruction or active inflammation. Normal appendix. Vascular/Lymphatic: There is moderate aortoiliac atherosclerotic disease. Evaluation of the vasculature is limited in the absence of intravenous contrast. No portal venous gas identified. Chest mildly enlarged external iliac lymph nodes as previously described. Follow-up evaluation as per recommendations of the prior CT. Reproductive: The prostate and seminal vesicles are grossly unremarkable. Other: Small fat containing bilateral inguinal and umbilical hernia. Musculoskeletal: Degenerative changes of the spine. Lower lumbar disc desiccation with vacuum phenomena. No acute fracture. IMPRESSION: 1. No hydronephrosis or nephrolithiasis. 2. Cholelithiasis. No pericholecystic fluid. Ultrasound may provide better evaluation of the gallbladder if clinically indicated. 3. Sigmoid diverticulosis. No bowel  obstruction or active inflammation. Normal appendix. 4. Aortic Atherosclerosis (ICD10-I70.0). Electronically Signed   By: Anner Crete M.D.   On: 11/23/2016 02:41   Dg Chest Port 1 View  Result Date: 11/23/2016 CLINICAL DATA:  66 year old male with diaphoresis. EXAM: PORTABLE CHEST 1 VIEW COMPARISON:  Chest radiograph dated 11/08/2016 FINDINGS: There is a right sided PICC with tip at the cavoatrial junction. The lungs are clear. There is overall interval improvement of the previously seen interstitial edema. There is no pleural effusion or pneumothorax. There is enlargement of the cardiopericardial silhouette. Atherosclerotic calcification of the aortic arch. No acute osseous pathology. IMPRESSION: 1. Cardiomegaly.  No focal consolidation. 2. Interval improvement of the previously seen pulmonary edema. 3. Right-sided PICC with tip at the cavoatrial junction in stable positioning. Electronically Signed   By: Anner Crete M.D.   On: 11/23/2016 00:24        Scheduled Meds: . ALPRAZolam  1 mg Oral TID  . atorvastatin  20 mg Oral BID  . citalopram  20 mg Oral Daily  . collagenase   Topical Daily  . feeding supplement (ENSURE ENLIVE)  237 mL Oral BID BM  . fluticasone furoate-vilanterol  1 puff Inhalation Daily  . heparin  5,000 Units Subcutaneous Q8H  . insulin aspart  0-5 Units Subcutaneous QHS  . insulin aspart  0-9 Units Subcutaneous TID WC  . ipratropium-albuterol  3 mL Inhalation TID   Continuous Infusions: . sodium chloride 100 mL/hr at 11/24/16 0511  . ceFEPime (MAXIPIME) IV 1 g (11/24/16 0512)     LOS: 1 day        Mauricio Gerome Apley, MD [facility] Pager [phone]  If 7PM-7AM, please contact night-coverage [email or web address] Password [facility] 11/24/2016, 9:49 AM

## 2016-11-25 DIAGNOSIS — T07XXXA Unspecified multiple injuries, initial encounter: Secondary | ICD-10-CM

## 2016-11-25 LAB — BASIC METABOLIC PANEL
Anion gap: 8 (ref 5–15)
BUN: 50 mg/dL — AB (ref 6–20)
CHLORIDE: 113 mmol/L — AB (ref 101–111)
CO2: 20 mmol/L — AB (ref 22–32)
CREATININE: 2.45 mg/dL — AB (ref 0.61–1.24)
Calcium: 7.8 mg/dL — ABNORMAL LOW (ref 8.9–10.3)
GFR calc Af Amer: 30 mL/min — ABNORMAL LOW (ref >60)
GFR calc non Af Amer: 26 mL/min — ABNORMAL LOW (ref >60)
GLUCOSE: 104 mg/dL — AB (ref 65–99)
Potassium: 3.3 mmol/L — ABNORMAL LOW (ref 3.5–5.1)
SODIUM: 141 mmol/L (ref 135–145)

## 2016-11-25 LAB — GLUCOSE, CAPILLARY
Glucose-Capillary: 89 mg/dL (ref 65–99)
Glucose-Capillary: 137 mg/dL — ABNORMAL HIGH (ref 65–99)
Glucose-Capillary: 136 mg/dL — ABNORMAL HIGH (ref 65–99)
Glucose-Capillary: 140 mg/dL — ABNORMAL HIGH (ref 65–99)

## 2016-11-25 LAB — CBC WITH DIFFERENTIAL/PLATELET
Basophils Absolute: 0 10*3/uL (ref 0.0–0.1)
Basophils Relative: 0 %
EOS ABS: 0.4 10*3/uL (ref 0.0–0.7)
Eosinophils Relative: 4 %
HEMATOCRIT: 30.2 % — AB (ref 39.0–52.0)
Hemoglobin: 10.3 g/dL — ABNORMAL LOW (ref 13.0–17.0)
LYMPHS ABS: 1.2 10*3/uL (ref 0.7–4.0)
LYMPHS PCT: 15 %
MCH: 33.8 pg (ref 26.0–34.0)
MCHC: 34.1 g/dL (ref 30.0–36.0)
MCV: 99 fL (ref 78.0–100.0)
MONOS PCT: 9 %
Monocytes Absolute: 0.8 10*3/uL (ref 0.1–1.0)
NEUTROS PCT: 72 %
Neutro Abs: 5.8 10*3/uL (ref 1.7–7.7)
Platelets: 212 10*3/uL (ref 150–400)
RBC: 3.05 MIL/uL — ABNORMAL LOW (ref 4.22–5.81)
RDW: 16.3 % — ABNORMAL HIGH (ref 11.5–15.5)
WBC: 8.2 10*3/uL (ref 4.0–10.5)

## 2016-11-25 LAB — URINE CULTURE: Culture: NO GROWTH

## 2016-11-25 LAB — VANCOMYCIN, RANDOM: VANCOMYCIN RM: 25

## 2016-11-25 MED ORDER — DAPTOMYCIN 500 MG IV SOLR
800.0000 mg | INTRAVENOUS | Status: DC
  Administered 2016-11-25 – 2016-11-28 (×4): 800 mg via INTRAVENOUS
  Filled 2016-11-25 (×6): qty 16

## 2016-11-25 NOTE — Progress Notes (Signed)
ANTIBIOTIC CONSULT NOTE-  Pharmacy Consult for daptomycin Indication: osteomyelitis, ARF with vancomycin  Allergies  Allergen Reactions  . Blueberry Flavor   . Cucumber Extract Other (See Comments)    "Fells like I'm having a heart attack"  . Flexeril [Cyclobenzaprine Hcl] Other (See Comments)    "whole body  Tremors" (05/27/2012)  . Kiwi Extract Other (See Comments)    "feels like I'm having a heart attack"    Patient Measurements: Height: 6\' 4"  (193 cm) Weight: 276 lb (125.2 kg) IBW/kg (Calculated) : 86.8 Adjusted Body Weight:   Vital Signs: Temp: 97.9 F (36.6 C) (06/24 0653) Temp Source: Oral (06/24 0653) BP: 120/57 (06/24 0653) Pulse Rate: 60 (06/24 0653)  Labs:  Recent Labs  11/22/16 2325 11/23/16 1539 11/23/16 1550 11/24/16 1111 11/25/16 0545  WBC 16.0*  --   --  8.5 8.2  HGB 12.0*  --   --  10.6* 10.3*  PLT 268  --   --  210 212  LABCREA  --   --  57.02  --   --   CREATININE 4.09* 3.81*  --  3.07* 2.45*    Estimated Creatinine Clearance: 42.9 mL/min (A) (by C-G formula based on SCr of 2.45 mg/dL (H)).   Recent Labs  11/23/16 0922 11/24/16 0609 11/24/16 1704 11/25/16 0545  VANCOTROUGH 44*  --  81*  --   VANCORANDOM  --  34  --  25     Microbiology: Recent Results (from the past 720 hour(s))  Urine Culture     Status: Abnormal   Collection Time: 11/08/16  8:20 PM  Result Value Ref Range Status   Specimen Description URINE, CATHETERIZED  Final   Special Requests NONE  Final   Culture 20,000 COLONIES/mL ENTEROCOCCUS FAECALIS (A)  Final   Report Status 11/11/2016 FINAL  Final   Organism ID, Bacteria ENTEROCOCCUS FAECALIS (A)  Final      Susceptibility   Enterococcus faecalis - MIC*    AMPICILLIN <=2 SENSITIVE Sensitive     LEVOFLOXACIN >=8 RESISTANT Resistant     NITROFURANTOIN <=16 SENSITIVE Sensitive     VANCOMYCIN 1 SENSITIVE Sensitive     * 20,000 COLONIES/mL ENTEROCOCCUS FAECALIS  Blood culture (routine x 2)     Status: None   Collection Time: 11/08/16  8:35 PM  Result Value Ref Range Status   Specimen Description LEFT ANTECUBITAL  Final   Special Requests   Final    BOTTLES DRAWN AEROBIC AND ANAEROBIC Blood Culture results may not be optimal due to an inadequate volume of blood received in culture bottles   Culture NO GROWTH 5 DAYS  Final   Report Status 11/13/2016 FINAL  Final  Blood culture (routine x 2)     Status: None   Collection Time: 11/08/16  8:35 PM  Result Value Ref Range Status   Specimen Description BLOOD LEFT HAND  Final   Special Requests   Final    BOTTLES DRAWN AEROBIC AND ANAEROBIC Blood Culture results may not be optimal due to an inadequate volume of blood received in culture bottles   Culture NO GROWTH 5 DAYS  Final   Report Status 11/13/2016 FINAL  Final  C difficile quick scan w PCR reflex     Status: None   Collection Time: 11/09/16  1:25 AM  Result Value Ref Range Status   C Diff antigen NEGATIVE NEGATIVE Final   C Diff toxin NEGATIVE NEGATIVE Final   C Diff interpretation No C. difficile detected.  Final  MRSA PCR Screening     Status: Abnormal   Collection Time: 11/09/16  1:30 AM  Result Value Ref Range Status   MRSA by PCR POSITIVE (A) NEGATIVE Final    Comment:        The GeneXpert MRSA Assay (FDA approved for NASAL specimens only), is one component of a comprehensive MRSA colonization surveillance program. It is not intended to diagnose MRSA infection nor to guide or monitor treatment for MRSA infections. RESULT CALLED TO, READ BACK BY AND VERIFIED WITH: WAGNOR,R AT 0330 ON 6.8.2018 BY ISLEY,B   Blood Culture (routine x 2)     Status: None (Preliminary result)   Collection Time: 11/22/16 11:39 PM  Result Value Ref Range Status   Specimen Description BLOOD LEFT ARM  Final   Special Requests   Final    BOTTLES DRAWN AEROBIC AND ANAEROBIC Blood Culture adequate volume   Culture NO GROWTH 1 DAY  Final   Report Status PENDING  Incomplete  Blood Culture (routine x 2)      Status: None (Preliminary result)   Collection Time: 11/22/16 11:46 PM  Result Value Ref Range Status   Specimen Description BLOOD LEFT ARM  Final   Special Requests   Final    Blood Culture adequate volume BOTTLES DRAWN AEROBIC AND ANAEROBIC   Culture NO GROWTH 1 DAY  Final   Report Status PENDING  Incomplete  Urine culture     Status: None   Collection Time: 11/23/16  3:40 PM  Result Value Ref Range Status   Specimen Description URINE, CATHETERIZED  Final   Special Requests NONE  Final   Culture   Final    NO GROWTH Performed at [facility], [place] [address], [place], [place] [address]    Report Status 11/25/2016 FINAL  Final    Medical History: Past Medical History:  Diagnosis Date  . Anxiety   . Chronic pain    legs, back; MRI 05/2012 with mild thoracic degenerative changes no spinal stenosis   . COPD (chronic obstructive pulmonary disease) ([place])   . Depression   . Diabetic peripheral neuropathy ([place])    "chronic" (05/27/2012)  . Diastolic CHF ([place])   . Hypercholesteremia   . Hypertension   . Peripheral edema   . PVD (peripheral vascular disease) ([place])   . Spinal stenosis    mild lumbar (MRI 05/2012)-L2-L3 to L4-L5 , mild lumbar foraminal stenosis   . Stroke ([place]) 05/2012   Subacute, lacunar infarcts within the left basal ganglia and posterior limp of the left internal capsule/thalamus; "RUE; both feet weak" (05/27/2012)  . Tremor   . Type II diabetes mellitus ([name])     Medications:  Infusions:  . sodium chloride 100 mL/hr at 11/25/16 1208  . ceFEPime (MAXIPIME) IV Stopped (11/25/16 0629)  . DAPTOmycin (CUBICIN)  IV     PRN:  Anti-infectives    Start     Dose/Rate Route Frequency Ordered Stop   11/25/16 1300  DAPTOmycin (CUBICIN) 800 mg in sodium chloride 0.9 % IVPB     800 mg 232 mL/hr over 30 Minutes Intravenous Every 24 hours 11/25/16 1241     11/24/16 0500  ceFEPIme (MAXIPIME) 1 g in dextrose 5 % 50 mL IVPB     1 g 100 mL/hr over 30 Minutes  Intravenous Every 24 hours 11/23/16 0752     11/23/16 0500  ceFEPIme (MAXIPIME) 2 g in dextrose 5 % 50 mL IVPB  Status:  Discontinued    Comments:  Monitor labs and make dose adjustments per pharmacy protocol and input.     2 g 100 mL/hr over 30 Minutes Intravenous Every 12 hours 11/23/16 0449 11/23/16 0752   11/23/16 0230  vancomycin (VANCOCIN) IVPB 1000 mg/200 mL premix     1,000 mg 200 mL/hr over 60 Minutes Intravenous Every 1 hr x 2 11/23/16 0131 11/23/16 0556   11/23/16 0130  piperacillin-tazobactam (ZOSYN) IVPB 3.375 g     3.375 g 100 mL/hr over 30 Minutes Intravenous  Once 11/23/16 0119 11/23/16 0154   11/23/16 0130  vancomycin (VANCOCIN) IVPB 1000 mg/200 mL premix  Status:  Discontinued     1,000 mg 200 mL/hr over 60 Minutes Intravenous  Once 11/23/16 0119 11/23/16 0131      Assessment: 67 yo male continuing to start daptomycin for osteomyelitis.  Goal of Therapy:  Eradication of infection  Plan:  Daptomycin 8mg /kg IV q24 hours.  Used ABWt due to obesity.(800 mg IV q24 hours) F/u CK in am   Beverlee Nims, [facility] 11/25/2016,12:44 PM

## 2016-11-25 NOTE — Progress Notes (Signed)
PROGRESS NOTE    Eric Scott  WUJ:[id number] DOB: Aug 29, 1950 DOA: 11/22/2016 PCP: [name], MD    Brief Narrative:  66 year old male who presents from nursing facility due to hypotension and diaphoresis. Patient was recently discharged 11/11/2016 with diagnosis of acute osteomyelitis of the right fifth toe, left calcaneus, treated with outpatient antibiotics with IV vancomycin and cefepime. Discharge medications included furosemide 80 mg daily and lisinopril 5 mg daily. Patient was found lethargic with chills and diaphoresis along with abdominal discomfort. Positive diarrhea, severe in intensity. On initial physical examination blood pressure 91/48, heart rate 76, respiratory 13, oxygen saturation 89-95% on room air. His mucous membranes were moist, his lungs were clear to auscultation bilaterally, no wheezing rales or rhonchi, heart S1-S2 present and rhythmic, abdomen was protuberant but soft and nontender, no lower extremity edema. Sodium 138, potassium 3.6, chloride 103, bicarbonate 21, glucose 143, BUN 71, creatinine 4.09, AST 88, ALT 116, white count 16.0, hemoglobin 12.0, hematocrit 35.8, platelets 268, CT of the abdomen show no hydronephrosis or nephrolithiasis. Positive cholelithiasis, sigmoid diverticulosis. Chest x-ray with cardiomegaly, no infiltrates, PICC line with tip in the SVC right atrium junction. EKG with normal sinus rhythm.  Patient was admitted to hospital working diagnosis of hypotension, complicated by acute kidney injury and elevated liver enzymes. Renal function improving with IV fluids.    Assessment & Plan:   Active Problems:   DM type 2 (diabetes mellitus, type 2) ([name])   AKI (acute kidney injury) ([place])   Gallstone   Wounds, multiple   Osteomyelitis ([name])   Volume depletion   Hypotension   Liver enzyme elevation   1. Hypotension due to volume depletion. Blood pressure with systolic up to 956'O, tolerating well IV fluids at 100 ml per hour. No further  diarrhea, patient tolerating po well.   2. AKI. Urine output up to 2,600 over last 24 hours, will change IV fluids to 0.45% saline at 100 ml per hour, to prevent worsening hyperchloremia and hypernatremia, Cl at 113 and Na 141, continue to monitor urine output. Continue to hold on vancomycin, avoid hypotension.  3. Elevated liver enzymes. Right upper quadrant [place], with cholelithiasis but no cholecystitis. Will follow on LFT's in am. No abdominal pian and tolerating po well.   4. Osteomyelitis. Positive osteomyelitis at the left and right foot, Plan for amputation, fifth ray on the right and transtibial on the left. On cefepime, will avoid vancomycin for now, and will resume MRSA coverage with Daptomycin. Check esr, crp and cpk in am.   5. Tremors. Chronic, clinically less severe.  6. T2DM. Capillary glucose 163, 138, 89, 137.  Glucose cover and monitoring with insulin sliding scale. As outpatient with 24 units of insulin Lantus.   DVT prophylaxis:heparin  Code Status:full Family Communication:no family at the bedside  Disposition Plan:  Consultants:   Procedures:    Antimicrobials:   Cefepime  Daptomycin    Subjective: Patient feeling better, no dyspnea or chest pain, tolerating po well. No chills, no significant foot pain.   Objective: Vitals:   11/24/16 2245 11/25/16 0653 11/25/16 0835 11/25/16 0841  BP: (!) 122/53 (!) 120/57    Pulse: 69 60    Resp: 18 18    Temp: 97.9 F (36.6 C) 97.9 F (36.6 C)    TempSrc: Oral Oral    SpO2: 92% 100% 100% 100%  Weight:      Height: _0  (1.93 m)       Intake/Output Summary (Last 24 hours) at  11/25/16 1052 Last data filed at 11/25/16 0654  Gross per 24 hour  Intake          2978.33 ml  Output             2600 ml  Net           378.33 ml   Filed Weights   11/23/16 1608  Weight: 125.2 kg (276 lb)    Examination:  General exam: deconditioned E ENT: mild pallor, no icterus, oral mucosa moist.     Respiratory system: Clear to auscultation. Respiratory effort normal. No wheezing, rales or rhonchi Cardiovascular system: S1 & S2 heard, RRR. No JVD, murmurs, rubs, gallops or clicks. No pedal edema. Gastrointestinal system: Abdomen is protuberant, is nondistended, soft and nontender. No organomegaly or masses felt. Normal bowel sounds heard. Central nervous system: Alert and oriented. No focal neurological deficits. Extremities: Symmetric 5 x 5 power. Skin: No rashes, lesions or ulcers    Data Reviewed: I have personally reviewed following labs and imaging studies  CBC:  Recent Labs Lab 11/22/16 2325 11/24/16 1111 11/25/16 0545  WBC 16.0* 8.5 8.2  NEUTROABS 14.2*  --  5.8  HGB 12.0* 10.6* 10.3*  HCT 35.8* 31.4* 30.2*  MCV 98.9 98.4 99.0  PLT 268 210 975   Basic Metabolic Panel:  Recent Labs Lab 11/22/16 2325 11/23/16 1539 11/24/16 1111 11/25/16 0545  NA 138 139 143 141  K 3.6 3.6 3.0* 3.3*  CL 103 108 112* 113*  CO2 21* 21* 21* 20*  GLUCOSE 143* 99 113* 104*  BUN 71* 65* 58* 50*  CREATININE 4.09* 3.81* 3.07* 2.45*  CALCIUM 8.6* 8.1* 8.1* 7.8*   GFR: Estimated Creatinine Clearance: 42.9 mL/min (A) (by C-G formula based on SCr of 2.45 mg/dL (H)). Liver Function Tests:  Recent Labs Lab 11/22/16 2325 11/24/16 0609  AST 88* 214*  ALT 116* 173*  ALKPHOS 333* 454*  BILITOT 1.2 1.8*  PROT 6.4* 6.8  ALBUMIN 2.7* 2.7*   No results for input(s): LIPASE, AMYLASE in the last 168 hours. No results for input(s): AMMONIA in the last 168 hours. Coagulation Profile: No results for input(s): INR, PROTIME in the last 168 hours. Cardiac Enzymes:  Recent Labs Lab 11/23/16 0922  CKTOTAL 20*   BNP (last 3 results) No results for input(s): PROBNP in the last 8760 hours. HbA1C: No results for input(s): HGBA1C in the last 72 hours. CBG:  Recent Labs Lab 11/24/16 0812 11/24/16 1145 11/24/16 1731 11/24/16 2118 11/25/16 0809  GLUCAP 81 89 163* 138* 89   Lipid  Profile: No results for input(s): CHOL, HDL, LDLCALC, TRIG, CHOLHDL, LDLDIRECT in the last 72 hours. Thyroid Function Tests: No results for input(s): TSH, T4TOTAL, FREET4, T3FREE, THYROIDAB in the last 72 hours. Anemia Panel: No results for input(s): VITAMINB12, FOLATE, FERRITIN, TIBC, IRON, RETICCTPCT in the last 72 hours. Sepsis Labs:  Recent Labs Lab 11/22/16 2311  LATICACIDVEN 0.72    Recent Results (from the past 240 hour(s))  Blood Culture (routine x 2)     Status: None (Preliminary result)   Collection Time: 11/22/16 11:39 PM  Result Value Ref Range Status   Specimen Description BLOOD LEFT ARM  Final   Special Requests   Final    BOTTLES DRAWN AEROBIC AND ANAEROBIC Blood Culture adequate volume   Culture NO GROWTH 1 DAY  Final   Report Status PENDING  Incomplete  Blood Culture (routine x 2)     Status: None (Preliminary result)   Collection Time: 11/22/16  11:46 PM  Result Value Ref Range Status   Specimen Description BLOOD LEFT ARM  Final   Special Requests   Final    Blood Culture adequate volume BOTTLES DRAWN AEROBIC AND ANAEROBIC   Culture NO GROWTH 1 DAY  Final   Report Status PENDING  Incomplete         Radiology Studies: US Abdomen Limited Ruq  Result Date: 11/24/2016 CLINICAL DATA:  66 year old male with cholelithiasis EXAM: ULTRASOUND ABDOMEN LIMITED RIGHT UPPER QUADRANT COMPARISON:  None. FINDINGS: Gallbladder: Echogenic gallstone in the dependent aspects of the gallbladder, located at the neck of the gallbladder measuring at least 3 cm. Posterior shadowing limits evaluation in the far field from the stone. No gallbladder wall thickening. Sonographic Murphy's sign was not recorded. No pericholecystic fluid. Common bile duct: Diameter: 8 mm Liver: Relatively unremarkable appearance of liver parenchyma. IMPRESSION: Cholelithiasis without sonographic evidence of acute cholecystitis Electronically Signed   By: Corrie Mckusick D.O.   On: 11/24/2016 13:48         Scheduled Meds: . ALPRAZolam  1 mg Oral TID  . atorvastatin  20 mg Oral BID  . citalopram  20 mg Oral Daily  . collagenase   Topical Daily  . feeding supplement (ENSURE ENLIVE)  237 mL Oral BID BM  . fluticasone furoate-vilanterol  1 puff Inhalation Daily  . heparin  5,000 Units Subcutaneous Q8H  . insulin aspart  0-5 Units Subcutaneous QHS  . insulin aspart  0-9 Units Subcutaneous TID WC  . ipratropium-albuterol  3 mL Inhalation TID   Continuous Infusions: . sodium chloride 100 mL/hr at 11/25/16 0102  . ceFEPime (MAXIPIME) IV Stopped (11/25/16 7981)     LOS: 2 days       Mauricio Gerome Apley, MD [facility] Pager [phone]  If 7PM-7AM, please contact night-coverage [email or web address] Password [facility] 11/25/2016, 10:52 AM

## 2016-11-26 LAB — BASIC METABOLIC PANEL
Anion gap: 8 (ref 5–15)
BUN: 41 mg/dL — ABNORMAL HIGH (ref 6–20)
CALCIUM: 8 mg/dL — AB (ref 8.9–10.3)
CHLORIDE: 111 mmol/L (ref 101–111)
CO2: 20 mmol/L — ABNORMAL LOW (ref 22–32)
CREATININE: 2.11 mg/dL — AB (ref 0.61–1.24)
GFR, EST AFRICAN AMERICAN: 36 mL/min — AB (ref >60)
GFR, EST NON AFRICAN AMERICAN: 31 mL/min — AB (ref >60)
Glucose, Bld: 117 mg/dL — ABNORMAL HIGH (ref 65–99)
Potassium: 3 mmol/L — ABNORMAL LOW (ref 3.5–5.1)
SODIUM: 139 mmol/L (ref 135–145)

## 2016-11-26 LAB — GLUCOSE, CAPILLARY
GLUCOSE-CAPILLARY: 173 mg/dL — AB (ref 65–99)
Glucose-Capillary: 130 mg/dL — ABNORMAL HIGH (ref 65–99)
Glucose-Capillary: 140 mg/dL — ABNORMAL HIGH (ref 65–99)
Glucose-Capillary: 128 mg/dL — ABNORMAL HIGH (ref 65–99)

## 2016-11-26 LAB — CBC WITH DIFFERENTIAL/PLATELET
BASOS PCT: 0 %
Basophils Absolute: 0 10*3/uL (ref 0.0–0.1)
EOS ABS: 0.3 10*3/uL (ref 0.0–0.7)
EOS PCT: 4 %
HCT: 30.5 % — ABNORMAL LOW (ref 39.0–52.0)
HEMOGLOBIN: 10.4 g/dL — AB (ref 13.0–17.0)
Lymphocytes Relative: 17 %
Lymphs Abs: 1.1 10*3/uL (ref 0.7–4.0)
MCH: 33.4 pg (ref 26.0–34.0)
MCHC: 34.1 g/dL (ref 30.0–36.0)
MCV: 98.1 fL (ref 78.0–100.0)
MONOS PCT: 7 %
Monocytes Absolute: 0.5 10*3/uL (ref 0.1–1.0)
NEUTROS PCT: 72 %
Neutro Abs: 4.9 10*3/uL (ref 1.7–7.7)
PLATELETS: 203 10*3/uL (ref 150–400)
RBC: 3.11 MIL/uL — ABNORMAL LOW (ref 4.22–5.81)
RDW: 16.1 % — AB (ref 11.5–15.5)
WBC: 6.8 10*3/uL (ref 4.0–10.5)

## 2016-11-26 LAB — C DIFFICILE QUICK SCREEN W PCR REFLEX
C DIFFICLE (CDIFF) ANTIGEN: POSITIVE — AB
C Diff toxin: NEGATIVE

## 2016-11-26 LAB — HEPATIC FUNCTION PANEL
ALBUMIN: 2.4 g/dL — AB (ref 3.5–5.0)
ALT: 115 U/L — ABNORMAL HIGH (ref 17–63)
AST: 79 U/L — ABNORMAL HIGH (ref 15–41)
Alkaline Phosphatase: 358 U/L — ABNORMAL HIGH (ref 38–126)
BILIRUBIN INDIRECT: 0.5 mg/dL (ref 0.3–0.9)
Bilirubin, Direct: 0.2 mg/dL (ref 0.1–0.5)
TOTAL PROTEIN: 6.4 g/dL — AB (ref 6.5–8.1)
Total Bilirubin: 0.7 mg/dL (ref 0.3–1.2)

## 2016-11-26 LAB — CK: CK TOTAL: 14 U/L — AB (ref 49–397)

## 2016-11-26 LAB — C-REACTIVE PROTEIN: CRP: 3.2 mg/dL — AB (ref <1.0)

## 2016-11-26 LAB — SEDIMENTATION RATE: SED RATE: 60 mm/h — AB (ref 0–16)

## 2016-11-26 MED ORDER — IPRATROPIUM-ALBUTEROL 0.5-2.5 (3) MG/3ML IN SOLN
3.0000 mL | Freq: Two times a day (BID) | RESPIRATORY_TRACT | Status: DC
  Administered 2016-11-26 – 2016-11-27 (×2): 3 mL via RESPIRATORY_TRACT
  Filled 2016-11-26 (×2): qty 3

## 2016-11-26 MED ORDER — POTASSIUM CHLORIDE 20 MEQ PO PACK
40.0000 meq | PACK | Freq: Once | ORAL | Status: AC
  Administered 2016-11-26: 40 meq via ORAL
  Filled 2016-11-26: qty 2

## 2016-11-26 NOTE — Progress Notes (Signed)
PROGRESS NOTE    Eric Scott  ZOX:[id number] DOB: 08-Mar-1951 DOA: 11/22/2016 PCP: [name], MD    Brief Narrative:  66 year old male who presents from nursing facility due to hypotension and diaphoresis. Patient was recently discharged 11/11/2016 with diagnosis of acute osteomyelitis of the right fifth toe, left calcaneus, treated with outpatient antibiotics with IV vancomycin and cefepime. Discharge medications included furosemide 80 mg daily and lisinopril 5 mg daily. Patient was found lethargic with chills and diaphoresis along with abdominal discomfort. Positive diarrhea, severe in intensity. On initial physical examination blood pressure 91/48, heart rate 76, respiratory 13, oxygen saturation 89-95% on room air. His mucous membranes were moist, his lungs were clear to auscultation bilaterally, no wheezing rales or rhonchi, heart S1-S2 present and rhythmic, abdomen was protuberant but soft and nontender, no lower extremity edema. Sodium 138, potassium 3.6, chloride 103, bicarbonate 21, glucose 143, BUN 71, creatinine 4.09, AST 88, ALT 116, white count 16.0, hemoglobin 12.0, hematocrit 35.8, platelets 268, CT of the abdomen show no hydronephrosis or nephrolithiasis. Positive cholelithiasis, sigmoid diverticulosis. Chest x-ray with cardiomegaly, no infiltrates, PICC line with tip in the SVC right atrium junction. EKG with normal sinus rhythm.  Patient was admitted to hospital working diagnosis of hypotension, complicated by acute kidney injury and elevated liver enzymes. Renal function improving with IV fluids. Started on Daptomycin.    Assessment & Plan:   Active Problems:   DM type 2 (diabetes mellitus, type 2) ([name])   AKI (acute kidney injury) ([place])   Gallstone   Wounds, multiple   Osteomyelitis ([name])   Volume depletion   Hypotension   Liver enzyme elevation   1. Hypotension due to volume depletion.  Will decrease rate of IV fluids, blood pressure continue to be stable, with  systolic 811 to 914.   2. AKI. Serum cr continue to trend down, today at 2,11, with serum K at 3,0 , cl 111, bicarbonate 20 and Na 139. Will continue 0.45% saline at 75 ml per hour. K repletion with caution, 40 meq once. Follow renal panel in am, avoid hypotension or nephrotoxic medications.   3. Elevated liver enzymes. AST 79 from 214, ALT 115 from 173. Bilirubins total 0.7. Tolerating po well, will need to address gallstones as outpatient.   4. Osteomyelitis. Positive osteomyelitis at the left and right foot, Plan for amputation, fifth ray on the right and transtibial on the left. Tolerating well Daptomycin.   5. Tremors. Chronic, clinically less severe. Stable.   6. T2DM. Capillary glucose 137, 136, 140, 173,130. Continue insulin sliding scale for glucose cover and monitoring, continue to hold on [place] acting insulin.   DVT prophylaxis:heparin  Code Status:full Family Communication:no family at the bedside  Disposition Plan:  Consultants:   Procedures:    Antimicrobials:   Cefepime  Daptomycin    Subjective: Patient continue to feel better, no dyspnea or chest pain, positive diarrhea, no fever or chills. Poor appetite, no significant pain at his wounds.   Objective: Vitals:   11/25/16 1500 11/25/16 1946 11/25/16 2200 11/26/16 0600  BP: 126/68  (!) 140/51 (!) 144/62  Pulse: 66  64 62  Resp: 20  18 13   Temp: 97.9 F (36.6 C)  97.7 F (36.5 C) 97.9 F (36.6 C)  TempSrc: Oral  Oral Oral  SpO2: 100% 99% 98% 95%  Weight:      Height:        Intake/Output Summary (Last 24 hours) at 11/26/16 0817 Last data filed at 11/26/16 [phone]  Gross per 24 hour  Intake              920 ml  Output             1075 ml  Net             -155 ml   Filed Weights   11/23/16 1608  Weight: 125.2 kg (276 lb)    Examination:  General exam: deconditioned E ENT: no pallor or icterus, oral mucosa moist.  Respiratory system: No wheezing, rales or rhonchi.  Respiratory  effort normal. Cardiovascular system: S1 & S2 heard, RRR. No JVD, murmurs, rubs, gallops or clicks. No pedal edema. Gastrointestinal system: Abdomen is protuberant, nondistended, soft and nontender. No organomegaly or masses felt. Normal bowel sounds heard. Central nervous system: Episodic confusion.  No focal neurological deficits. Extremities: Symmetric 5 x 5 power. Skin: bilateral feet wounds with dressing in place.  .     Data Reviewed: I have personally reviewed following labs and imaging studies  CBC:  Recent Labs Lab 11/22/16 2325 11/24/16 1111 11/25/16 0545 11/26/16 0633  WBC 16.0* 8.5 8.2 6.8  NEUTROABS 14.2*  --  5.8 4.9  HGB 12.0* 10.6* 10.3* 10.4*  HCT 35.8* 31.4* 30.2* 30.5*  MCV 98.9 98.4 99.0 98.1  PLT 268 210 212 517   Basic Metabolic Panel:  Recent Labs Lab 11/22/16 2325 11/23/16 1539 11/24/16 1111 11/25/16 0545 11/26/16 0633  NA 138 139 143 141 139  K 3.6 3.6 3.0* 3.3* 3.0*  CL 103 108 112* 113* 111  CO2 21* 21* 21* 20* 20*  GLUCOSE 143* 99 113* 104* 117*  BUN 71* 65* 58* 50* 41*  CREATININE 4.09* 3.81* 3.07* 2.45* 2.11*  CALCIUM 8.6* 8.1* 8.1* 7.8* 8.0*   GFR: Estimated Creatinine Clearance: 49.8 mL/min (A) (by C-G formula based on SCr of 2.11 mg/dL (H)). Liver Function Tests:  Recent Labs Lab 11/22/16 2325 11/24/16 0609 11/26/16 0633  AST 88* 214* 79*  ALT 116* 173* 115*  ALKPHOS 333* 454* 358*  BILITOT 1.2 1.8* 0.7  PROT 6.4* 6.8 6.4*  ALBUMIN 2.7* 2.7* 2.4*   No results for input(s): LIPASE, AMYLASE in the last 168 hours. No results for input(s): AMMONIA in the last 168 hours. Coagulation Profile: No results for input(s): INR, PROTIME in the last 168 hours. Cardiac Enzymes:  Recent Labs Lab 11/23/16 0922 11/26/16 0633  CKTOTAL 20* 14*   BNP (last 3 results) No results for input(s): PROBNP in the last 8760 hours. HbA1C: No results for input(s): HGBA1C in the last 72 hours. CBG:  Recent Labs Lab 11/25/16 0809  11/25/16 1135 11/25/16 1625 11/25/16 2204 11/26/16 0756  GLUCAP 89 137* 136* 140* 173*   Lipid Profile: No results for input(s): CHOL, HDL, LDLCALC, TRIG, CHOLHDL, LDLDIRECT in the last 72 hours. Thyroid Function Tests: No results for input(s): TSH, T4TOTAL, FREET4, T3FREE, THYROIDAB in the last 72 hours. Anemia Panel: No results for input(s): VITAMINB12, FOLATE, FERRITIN, TIBC, IRON, RETICCTPCT in the last 72 hours. Sepsis Labs:  Recent Labs Lab 11/22/16 2311  LATICACIDVEN 0.72    Recent Results (from the past 240 hour(s))  Blood Culture (routine x 2)     Status: None (Preliminary result)   Collection Time: 11/22/16 11:39 PM  Result Value Ref Range Status   Specimen Description BLOOD LEFT ARM  Final   Special Requests   Final    BOTTLES DRAWN AEROBIC AND ANAEROBIC Blood Culture adequate volume   Culture NO GROWTH 3 DAYS  Final  Report Status PENDING  Incomplete  Blood Culture (routine x 2)     Status: None (Preliminary result)   Collection Time: 11/22/16 11:46 PM  Result Value Ref Range Status   Specimen Description BLOOD LEFT ARM  Final   Special Requests   Final    Blood Culture adequate volume BOTTLES DRAWN AEROBIC AND ANAEROBIC   Culture NO GROWTH 3 DAYS  Final   Report Status PENDING  Incomplete  Urine culture     Status: None   Collection Time: 11/23/16  3:40 PM  Result Value Ref Range Status   Specimen Description URINE, CATHETERIZED  Final   Special Requests NONE  Final   Culture   Final    NO GROWTH Performed at [facility], [place] [address], [place], [place] [address]    Report Status 11/25/2016 FINAL  Final         Radiology Studies: US Abdomen Limited Ruq  Result Date: 11/24/2016 CLINICAL DATA:  66 year old male with cholelithiasis EXAM: ULTRASOUND ABDOMEN LIMITED RIGHT UPPER QUADRANT COMPARISON:  None. FINDINGS: Gallbladder: Echogenic gallstone in the dependent aspects of the gallbladder, located at the neck of the gallbladder measuring at  least 3 cm. Posterior shadowing limits evaluation in the far field from the stone. No gallbladder wall thickening. Sonographic Murphy's sign was not recorded. No pericholecystic fluid. Common bile duct: Diameter: 8 mm Liver: Relatively unremarkable appearance of liver parenchyma. IMPRESSION: Cholelithiasis without sonographic evidence of acute cholecystitis Electronically Signed   By: Corrie Mckusick D.O.   On: 11/24/2016 13:48        Scheduled Meds: . ALPRAZolam  1 mg Oral TID  . atorvastatin  20 mg Oral BID  . citalopram  20 mg Oral Daily  . collagenase   Topical Daily  . feeding supplement (ENSURE ENLIVE)  237 mL Oral BID BM  . fluticasone furoate-vilanterol  1 puff Inhalation Daily  . heparin  5,000 Units Subcutaneous Q8H  . insulin aspart  0-5 Units Subcutaneous QHS  . insulin aspart  0-9 Units Subcutaneous TID WC  . ipratropium-albuterol  3 mL Inhalation TID  . potassium chloride  40 mEq Oral Once   Continuous Infusions: . sodium chloride 100 mL/hr at 11/25/16 2250  . ceFEPime (MAXIPIME) IV Stopped (11/26/16 0636)  . DAPTOmycin (CUBICIN)  IV Stopped (11/25/16 1714)     LOS: 3 days        Mauricio Gerome Apley, MD [facility] Pager [phone]  If 7PM-7AM, please contact night-coverage [email or web address] Password [facility] 11/26/2016, 8:17 AM

## 2016-11-26 NOTE — Progress Notes (Signed)
Patient has had three loose stools today. Dr. Cathlean Sauer notified via text page.

## 2016-11-27 LAB — GLUCOSE, CAPILLARY
GLUCOSE-CAPILLARY: 116 mg/dL — AB (ref 65–99)
GLUCOSE-CAPILLARY: 142 mg/dL — AB (ref 65–99)
GLUCOSE-CAPILLARY: 176 mg/dL — AB (ref 65–99)
Glucose-Capillary: 149 mg/dL — ABNORMAL HIGH (ref 65–99)

## 2016-11-27 LAB — BASIC METABOLIC PANEL
Anion gap: 8 (ref 5–15)
Anion gap: 7 (ref 5–15)
BUN: 32 mg/dL — AB (ref 6–20)
BUN: 31 mg/dL — AB (ref 6–20)
CALCIUM: 7.9 mg/dL — AB (ref 8.9–10.3)
CHLORIDE: 112 mmol/L — AB (ref 101–111)
CHLORIDE: 112 mmol/L — AB (ref 101–111)
CO2: 20 mmol/L — AB (ref 22–32)
CO2: 21 mmol/L — AB (ref 22–32)
CREATININE: 1.75 mg/dL — AB (ref 0.61–1.24)
Calcium: 8 mg/dL — ABNORMAL LOW (ref 8.9–10.3)
Creatinine, Ser: 1.82 mg/dL — ABNORMAL HIGH (ref 0.61–1.24)
GFR calc Af Amer: 43 mL/min — ABNORMAL LOW (ref >60)
GFR calc Af Amer: 45 mL/min — ABNORMAL LOW (ref >60)
GFR calc non Af Amer: 37 mL/min — ABNORMAL LOW (ref >60)
GFR calc non Af Amer: 39 mL/min — ABNORMAL LOW (ref >60)
GLUCOSE: 114 mg/dL — AB (ref 65–99)
GLUCOSE: 184 mg/dL — AB (ref 65–99)
POTASSIUM: 2.9 mmol/L — AB (ref 3.5–5.1)
Potassium: 2.9 mmol/L — ABNORMAL LOW (ref 3.5–5.1)
Sodium: 140 mmol/L (ref 135–145)
Sodium: 140 mmol/L (ref 135–145)

## 2016-11-27 LAB — PATHOLOGIST SMEAR REVIEW

## 2016-11-27 LAB — CLOSTRIDIUM DIFFICILE BY PCR: Toxigenic C. Difficile by PCR: POSITIVE — AB

## 2016-11-27 MED ORDER — POTASSIUM CHLORIDE 20 MEQ PO PACK
40.0000 meq | PACK | Freq: Once | ORAL | Status: AC
  Administered 2016-11-27: 40 meq via ORAL
  Filled 2016-11-27: qty 2

## 2016-11-27 MED ORDER — VANCOMYCIN 50 MG/ML ORAL SOLUTION
125.0000 mg | Freq: Four times a day (QID) | ORAL | Status: DC
  Administered 2016-11-27 – 2016-11-29 (×10): 125 mg via ORAL
  Filled 2016-11-27 (×14): qty 2.5

## 2016-11-27 MED ORDER — IPRATROPIUM-ALBUTEROL 0.5-2.5 (3) MG/3ML IN SOLN: 3.0000 mL | Freq: Four times a day (QID) | RESPIRATORY_TRACT | Status: DC | PRN

## 2016-11-27 MED ORDER — DEXTROSE 5 % IV SOLN
2.0000 g | INTRAVENOUS | Status: DC
  Administered 2016-11-28 – 2016-11-29 (×2): 2 g via INTRAVENOUS
  Filled 2016-11-27 (×2): qty 2

## 2016-11-27 NOTE — Consult Note (Addendum)
WOC consulted for LE wounds, reviewed the records.  MRI left foot Severe osteoarthritis of the first MTP joint with subchondral reactive marrow edema. Mild osteoarthritis of the first IP joint. MRI right foot Soft tissue wound along the lateral aspect of the fourth this overlying the fifth metatarsal head. Acute osteomyelitis of the fifth metatarsal head and fifth proximal phalanx ABIs from May 2018 right: 0.77/left 0.80 Wound type: full thickness ulcerations, mixed etiology neuropathic/pressure/arterial   He is currently under the care of Dr. Meridee Score with plans for amputations early July.  I have viewed the images in the EMR dated 11/23/16.  Currently he has an open wound on the right lateral foot that is 25% yellow/75% pionk, non granular. He has an open wound on the left heel that has granulation tissue with some yellow fibrin along the medial aspect of the wound.  I will continue the current POC Dr. Sharol Given has established with enzymatic debridement ointment to both wound beds, top with saline moist gauze, top with dry dressing.  Patient will need to keep follow up appointments with Dr. Sharol Given for [place] term management of these wounds and/or amputations.  Discussed POC with patient and bedside nurse.  Re consult if needed, will not follow at this time. Thanks  Oliviarose Punch [employer], RN,CWOCN, CNS, [place] [phone])

## 2016-11-27 NOTE — Care Management Note (Signed)
Case Management Note  Patient Details  Name: SIMS LADAY MRN: [id number] Date of Birth: 1951/01/03   Expected Discharge Date:       11/28/2016           Expected Discharge Plan:  [place]  In-House Referral:  Clinical Social Work  Discharge planning Services  CM Consult  Post Acute Care Choice:  NA Choice offered to:  NA  DME Arranged:    DME Agency:     HH Arranged:    [place] Agency:     Status of Service:  Completed, signed off  If discussed at [employer] of Stay Meetings, dates discussed: 11/27/2016   Additional Comments:   Taira Knabe, Chauncey Reading, RN 11/27/2016, 10:21 AM

## 2016-11-27 NOTE — Progress Notes (Addendum)
PROGRESS NOTE    Eric Scott  WUJ:[id number] DOB: Oct 31, 1950 DOA: 11/22/2016 PCP: [name], MD    Brief Narrative:  66 year old male who presents from nursing facility due to hypotension and diaphoresis. Patient was recently discharged 11/11/2016 with diagnosis of acute osteomyelitis of the right fifth toe, left calcaneus, treated with outpatient antibiotics with IV vancomycin and cefepime. Discharge medications included furosemide 80 mg daily and lisinopril 5 mg daily. Patient was found lethargic with chills and diaphoresis along with abdominal discomfort. Positive diarrhea, severe in intensity. On initial physical examination blood pressure 91/48, heart rate 76, respiratory 13, oxygen saturation 89-95% on room air. His mucous membranes were moist, his lungs were clear to auscultation bilaterally, no wheezing rales or rhonchi, heart S1-S2 present and rhythmic, abdomen was protuberant but soft and nontender, no lower extremity edema. Sodium 138, potassium 3.6, chloride 103, bicarbonate 21, glucose 143, BUN 71, creatinine 4.09, AST 88, ALT 116, white count 16.0, hemoglobin 12.0, hematocrit 35.8, platelets 268, CT of the abdomen show no hydronephrosis or nephrolithiasis. Positive cholelithiasis, sigmoid diverticulosis. Chest x-ray with cardiomegaly, no infiltrates, PICC line with tip in the SVC right atrium junction. EKG with normal sinus rhythm.  Patient was admitted to hospital working diagnosis of hypotension, complicated by acute kidney injury and elevated liver enzymes. Renal function improving with IV fluids. Started on Daptomycin. Tested positive for C diff antigen, placed on oral vancomycin.    Assessment & Plan:   Active Problems:   DM type 2 (diabetes mellitus, type 2) ([name])   AKI (acute kidney injury) ([place])   Gallstone   Wounds, multiple   Osteomyelitis ([name])   Volume depletion   Hypotension   Liver enzyme elevation   1. Hypotension due to volume depletion.  Continue IV  fluids at 75 cc per hour. No signs of volume overload, blood pressure systolic 956 to 213.   2. AKI. Serum cr continue to trend down, today at 1,82, with serum K at 2,9 , cl 112, bicarbonate 20 and Na 140. Continue IV fluids with 0.45% saline at 75 ml per hour. K repletion with caution, 40 meq once. Follow renal panel in am, avoid hypotension or nephrotoxic medications.    3. Elevated liver enzymes. Improving, patient tolerating po well. Positive gallstone with no cholecystitis, will need to addressed stones as outpatient when more stable.   4. Osteomyelitis. Positive osteomyelitis at the left and right foot, Plan for amputation, per Dr. Jeanett Schlein, fifth ray on the right and transtibial on the left. IV Vancomycin has been changed to Daptomycin, to prevent further AKI, will need to follow up crp, esr, cpk, bmp and cbc, weekly.   5. Tremors.Satble chronic tremors.   6. T2DM. Capillary glucose 128, 116, 142, 177. Will continue insulin sliding scale for glucose cover and monitoring, continue to hold on [place] acting insulin. Patient with poor oral intake.   7. New c diff. Patient with significant risk factors, M[place] term therapy with broad spectrum antibiotic therapy, more than 3 watery stools in 24 hours, antigen positive but toxin negative, will continue therapy with po vancomycin due to high pre-test probability for c diff.   DVT prophylaxis:heparin  Code Status:full Family Communication:no family at the bedside  Disposition Plan:   Consultants:   Procedures:    Antimicrobials:   Cefepime  Daptomycin   Subjective: Had about 3 loose stools yesterday and 2 last night, tested positive for C diff antigen, started on po vancomycin. This am no nausea or vomiting, continue to have  poor appetite. No fever or chills.   Objective: Vitals:   11/26/16 0815 11/26/16 1917 11/26/16 2004 11/27/16 0643  BP:   (!) 143/60 (!) 153/62  Pulse:   (!) 57 61  Resp:   20 18  Temp:   98.6 F  (37 C) 98.9 F (37.2 C)  TempSrc:   Oral Oral  SpO2: 97% 96% 98% 97%  Weight:      Height:        Intake/Output Summary (Last 24 hours) at 11/27/16 0738 Last data filed at 11/27/16 0644  Gross per 24 hour  Intake          2524.08 ml  Output             3190 ml  Net          -665.92 ml   Filed Weights   11/23/16 1608  Weight: 125.2 kg (276 lb)    Examination:  General exam: deconditioned E ENT. Oral mucosa moist, mild pallor, no icterus.   Respiratory system: Mild decreased breath sounds at bases. Respiratory effort normal. No wheezing, rales or rhonchi.  Cardiovascular system: S1 & S2 heard, RRR. No JVD, murmurs, rubs, gallops or clicks. No pedal edema. Gastrointestinal system: Abdomen is protuberant but nondistended, soft and nontender. No organomegaly or masses felt. Normal bowel sounds heard. Central nervous system: Alert and oriented. No focal neurological deficits. Extremities: Symmetric 5 x 5 power. Skin: feet wounds with dressing in place.      Data Reviewed: I have personally reviewed following labs and imaging studies  CBC:  Recent Labs Lab 11/22/16 2325 11/24/16 1111 11/25/16 0545 11/26/16 0633  WBC 16.0* 8.5 8.2 6.8  NEUTROABS 14.2*  --  5.8 4.9  HGB 12.0* 10.6* 10.3* 10.4*  HCT 35.8* 31.4* 30.2* 30.5*  MCV 98.9 98.4 99.0 98.1  PLT 268 210 212 761   Basic Metabolic Panel:  Recent Labs Lab 11/23/16 1539 11/24/16 1111 11/25/16 0545 11/26/16 0633 11/27/16 0656  NA 139 143 141 139 140  K 3.6 3.0* 3.3* 3.0* 2.9*  CL 108 112* 113* 111 112*  CO2 21* 21* 20* 20* 20*  GLUCOSE 99 113* 104* 117* 114*  BUN 65* 58* 50* 41* 32*  CREATININE 3.81* 3.07* 2.45* 2.11* 1.82*  CALCIUM 8.1* 8.1* 7.8* 8.0* 7.9*   GFR: Estimated Creatinine Clearance: 57.7 mL/min (A) (by C-G formula based on SCr of 1.82 mg/dL (H)). Liver Function Tests:  Recent Labs Lab 11/22/16 2325 11/24/16 0609 11/26/16 0633  AST 88* 214* 79*  ALT 116* 173* 115*  ALKPHOS 333* 454*  358*  BILITOT 1.2 1.8* 0.7  PROT 6.4* 6.8 6.4*  ALBUMIN 2.7* 2.7* 2.4*   No results for input(s): LIPASE, AMYLASE in the last 168 hours. No results for input(s): AMMONIA in the last 168 hours. Coagulation Profile: No results for input(s): INR, PROTIME in the last 168 hours. Cardiac Enzymes:  Recent Labs Lab 11/23/16 0922 11/26/16 0633  CKTOTAL 20* 14*   BNP (last 3 results) No results for input(s): PROBNP in the last 8760 hours. HbA1C: No results for input(s): HGBA1C in the last 72 hours. CBG:  Recent Labs Lab 11/25/16 2204 11/26/16 0756 11/26/16 1141 11/26/16 1639 11/26/16 2006  GLUCAP 140* 173* 130* 140* 128*   Lipid Profile: No results for input(s): CHOL, HDL, LDLCALC, TRIG, CHOLHDL, LDLDIRECT in the last 72 hours. Thyroid Function Tests: No results for input(s): TSH, T4TOTAL, FREET4, T3FREE, THYROIDAB in the last 72 hours. Anemia Panel: No results for input(s): VITAMINB12, FOLATE,  FERRITIN, TIBC, IRON, RETICCTPCT in the last 72 hours. Sepsis Labs:  Recent Labs Lab 11/22/16 2311  LATICACIDVEN 0.72    Recent Results (from the past 240 hour(s))  Blood Culture (routine x 2)     Status: None (Preliminary result)   Collection Time: 11/22/16 11:39 PM  Result Value Ref Range Status   Specimen Description BLOOD LEFT ARM  Final   Special Requests   Final    BOTTLES DRAWN AEROBIC AND ANAEROBIC Blood Culture adequate volume   Culture NO GROWTH 3 DAYS  Final   Report Status PENDING  Incomplete  Blood Culture (routine x 2)     Status: None (Preliminary result)   Collection Time: 11/22/16 11:46 PM  Result Value Ref Range Status   Specimen Description BLOOD LEFT ARM  Final   Special Requests   Final    Blood Culture adequate volume BOTTLES DRAWN AEROBIC AND ANAEROBIC   Culture NO GROWTH 3 DAYS  Final   Report Status PENDING  Incomplete  Urine culture     Status: None   Collection Time: 11/23/16  3:40 PM  Result Value Ref Range Status   Specimen Description URINE,  CATHETERIZED  Final   Special Requests NONE  Final   Culture   Final    NO GROWTH Performed at [facility], [place] [address], [place], [place] [address]    Report Status 11/25/2016 FINAL  Final  C difficile quick scan w PCR reflex     Status: Abnormal   Collection Time: 11/26/16  4:55 PM  Result Value Ref Range Status   C Diff antigen POSITIVE (A) NEGATIVE Final   C Diff toxin NEGATIVE NEGATIVE Final   C Diff interpretation Results are indeterminate. See PCR results.  Final  Clostridium Difficile by PCR     Status: Abnormal   Collection Time: 11/26/16  4:55 PM  Result Value Ref Range Status   Toxigenic C Difficile by pcr POSITIVE (A) NEGATIVE Final    Comment: Positive for toxigenic C. difficile with little to no toxin production. Only treat if clinical presentation suggests symptomatic illness.         Radiology Studies: No results found.      Scheduled Meds: . ALPRAZolam  1 mg Oral TID  . atorvastatin  20 mg Oral BID  . citalopram  20 mg Oral Daily  . collagenase   Topical Daily  . feeding supplement (ENSURE ENLIVE)  237 mL Oral BID BM  . fluticasone furoate-vilanterol  1 puff Inhalation Daily  . heparin  5,000 Units Subcutaneous Q8H  . insulin aspart  0-5 Units Subcutaneous QHS  . insulin aspart  0-9 Units Subcutaneous TID WC  . ipratropium-albuterol  3 mL Inhalation BID  . potassium chloride  40 mEq Oral Once  . vancomycin  125 mg Oral QID   Continuous Infusions: . sodium chloride 75 mL/hr at 11/26/16 2007  . ceFEPime (MAXIPIME) IV Stopped (11/27/16 0705)  . DAPTOmycin (CUBICIN)  IV Stopped (11/26/16 1445)     LOS: 4 days      Mauricio Gerome Apley, MD [facility] Pager [phone]  If 7PM-7AM, please contact night-coverage [email or web address] Password [facility] 11/27/2016, 7:38 AM

## 2016-11-27 NOTE — Progress Notes (Addendum)
ANTIBIOTIC CONSULT NOTE-  Pharmacy Consult for daptomycin and cefepime Indication: osteomyelitis, ARF with Vancomycin  Allergies  Allergen Reactions  . Blueberry Flavor   . Cucumber Extract Other (See Comments)    "Fells like I'm having a heart attack"  . Flexeril [Cyclobenzaprine Hcl] Other (See Comments)    "whole body  Tremors" (05/27/2012)  . Kiwi Extract Other (See Comments)    "feels like I'm having a heart attack"   Patient Measurements: Height: 6\' 4"  (193 cm) Weight: 276 lb (125.2 kg) IBW/kg (Calculated) : 86.8  Vital Signs: Temp: 98.9 F (37.2 C) (06/26 0643) Temp Source: Oral (06/26 0643) BP: 153/62 (06/26 0643) Pulse Rate: 61 (06/26 0643)  Labs:  Recent Labs  11/25/16 0545 11/26/16 0633 11/27/16 0656  WBC 8.2 6.8  --   HGB 10.3* 10.4*  --   PLT 212 203  --   CREATININE 2.45* 2.11* 1.82*   Estimated Creatinine Clearance: 57.7 mL/min (A) (by C-G formula based on SCr of 1.82 mg/dL (H)).   Recent Labs  11/24/16 1704 11/25/16 0545  VANCOTROUGH 81*  --   VANCORANDOM  --  25    Microbiology: Recent Results (from the past 720 hour(s))  Urine Culture     Status: Abnormal   Collection Time: 11/08/16  8:20 PM  Result Value Ref Range Status   Specimen Description URINE, CATHETERIZED  Final   Special Requests NONE  Final   Culture 20,000 COLONIES/mL ENTEROCOCCUS FAECALIS (A)  Final   Report Status 11/11/2016 FINAL  Final   Organism ID, Bacteria ENTEROCOCCUS FAECALIS (A)  Final      Susceptibility   Enterococcus faecalis - MIC*    AMPICILLIN <=2 SENSITIVE Sensitive     LEVOFLOXACIN >=8 RESISTANT Resistant     NITROFURANTOIN <=16 SENSITIVE Sensitive     VANCOMYCIN 1 SENSITIVE Sensitive     * 20,000 COLONIES/mL ENTEROCOCCUS FAECALIS  Blood culture (routine x 2)     Status: None   Collection Time: 11/08/16  8:35 PM  Result Value Ref Range Status   Specimen Description LEFT ANTECUBITAL  Final   Special Requests   Final    BOTTLES DRAWN AEROBIC AND  ANAEROBIC Blood Culture results may not be optimal due to an inadequate volume of blood received in culture bottles   Culture NO GROWTH 5 DAYS  Final   Report Status 11/13/2016 FINAL  Final  Blood culture (routine x 2)     Status: None   Collection Time: 11/08/16  8:35 PM  Result Value Ref Range Status   Specimen Description BLOOD LEFT HAND  Final   Special Requests   Final    BOTTLES DRAWN AEROBIC AND ANAEROBIC Blood Culture results may not be optimal due to an inadequate volume of blood received in culture bottles   Culture NO GROWTH 5 DAYS  Final   Report Status 11/13/2016 FINAL  Final  C difficile quick scan w PCR reflex     Status: None   Collection Time: 11/09/16  1:25 AM  Result Value Ref Range Status   C Diff antigen NEGATIVE NEGATIVE Final   C Diff toxin NEGATIVE NEGATIVE Final   C Diff interpretation No C. difficile detected.  Final  MRSA PCR Screening     Status: Abnormal   Collection Time: 11/09/16  1:30 AM  Result Value Ref Range Status   MRSA by PCR POSITIVE (A) NEGATIVE Final    Comment:        The GeneXpert MRSA Assay (FDA approved for NASAL  specimens only), is one component of a comprehensive MRSA colonization surveillance program. It is not intended to diagnose MRSA infection nor to guide or monitor treatment for MRSA infections. RESULT CALLED TO, READ BACK BY AND VERIFIED WITH: WAGNOR,R AT 0330 ON 6.8.2018 BY ISLEY,B   Blood Culture (routine x 2)     Status: None (Preliminary result)   Collection Time: 11/22/16 11:39 PM  Result Value Ref Range Status   Specimen Description BLOOD LEFT ARM  Final   Special Requests   Final    BOTTLES DRAWN AEROBIC AND ANAEROBIC Blood Culture adequate volume   Culture NO GROWTH 3 DAYS  Final   Report Status PENDING  Incomplete  Blood Culture (routine x 2)     Status: None (Preliminary result)   Collection Time: 11/22/16 11:46 PM  Result Value Ref Range Status   Specimen Description BLOOD LEFT ARM  Final   Special Requests    Final    Blood Culture adequate volume BOTTLES DRAWN AEROBIC AND ANAEROBIC   Culture NO GROWTH 3 DAYS  Final   Report Status PENDING  Incomplete  Urine culture     Status: None   Collection Time: 11/23/16  3:40 PM  Result Value Ref Range Status   Specimen Description URINE, CATHETERIZED  Final   Special Requests NONE  Final   Culture   Final    NO GROWTH Performed at [facility], [place] [address], [place], [place] [address]    Report Status 11/25/2016 FINAL  Final  C difficile quick scan w PCR reflex     Status: Abnormal   Collection Time: 11/26/16  4:55 PM  Result Value Ref Range Status   C Diff antigen POSITIVE (A) NEGATIVE Final   C Diff toxin NEGATIVE NEGATIVE Final   C Diff interpretation Results are indeterminate. See PCR results.  Final  Clostridium Difficile by PCR     Status: Abnormal   Collection Time: 11/26/16  4:55 PM  Result Value Ref Range Status   Toxigenic C Difficile by pcr POSITIVE (A) NEGATIVE Final    Comment: Positive for toxigenic C. difficile with little to no toxin production. Only treat if clinical presentation suggests symptomatic illness.   Medical History: Past Medical History:  Diagnosis Date  . Anxiety   . Chronic pain    legs, back; MRI 05/2012 with mild thoracic degenerative changes no spinal stenosis   . COPD (chronic obstructive pulmonary disease) ([place])   . Depression   . Diabetic peripheral neuropathy ([place])    "chronic" (05/27/2012)  . Diastolic CHF ([place])   . Hypercholesteremia   . Hypertension   . Peripheral edema   . PVD (peripheral vascular disease) ([place])   . Spinal stenosis    mild lumbar (MRI 05/2012)-L2-L3 to L4-L5 , mild lumbar foraminal stenosis   . Stroke ([place]) 05/2012   Subacute, lacunar infarcts within the left basal ganglia and posterior limp of the left internal capsule/thalamus; "RUE; both feet weak" (05/27/2012)  . Tremor   . Type II diabetes mellitus ([name])     Medications:  Infusions:  . sodium chloride 75  mL/hr at 11/26/16 2007  . [START ON 11/28/2016] ceFEPime (MAXIPIME) IV    . DAPTOmycin (CUBICIN)  IV Stopped (11/27/16 1341)   PRN:  Anti-infectives    Start     Dose/Rate Route Frequency Ordered Stop   11/28/16 0500  ceFEPIme (MAXIPIME) 2 g in dextrose 5 % 50 mL IVPB     2 g 100 mL/hr over 30  Minutes Intravenous Every 24 hours 11/27/16 1342     11/27/16 1000  vancomycin (VANCOCIN) 50 mg/mL oral solution 125 mg     125 mg Oral 4 times daily 11/27/16 0735 12/11/16 0959   11/25/16 1300  DAPTOmycin (CUBICIN) 800 mg in sodium chloride 0.9 % IVPB     800 mg 232 mL/hr over 30 Minutes Intravenous Every 24 hours 11/25/16 1241     11/24/16 0500  ceFEPIme (MAXIPIME) 1 g in dextrose 5 % 50 mL IVPB  Status:  Discontinued     1 g 100 mL/hr over 30 Minutes Intravenous Every 24 hours 11/23/16 0752 11/27/16 1342   11/23/16 0500  ceFEPIme (MAXIPIME) 2 g in dextrose 5 % 50 mL IVPB  Status:  Discontinued    Comments:  Monitor labs and make dose adjustments per pharmacy protocol and input.     2 g 100 mL/hr over 30 Minutes Intravenous Every 12 hours 11/23/16 0449 11/23/16 0752   11/23/16 0230  vancomycin (VANCOCIN) IVPB 1000 mg/200 mL premix     1,000 mg 200 mL/hr over 60 Minutes Intravenous Every 1 hr x 2 11/23/16 0131 11/23/16 0556   11/23/16 0130  piperacillin-tazobactam (ZOSYN) IVPB 3.375 g     3.375 g 100 mL/hr over 30 Minutes Intravenous  Once 11/23/16 0119 11/23/16 0154   11/23/16 0130  vancomycin (VANCOCIN) IVPB 1000 mg/200 mL premix  Status:  Discontinued     1,000 mg 200 mL/hr over 60 Minutes Intravenous  Once 11/23/16 0119 11/23/16 0131     Assessment: 66 yo male with osteo, to start daptomycin for osteomyelitis.  Pt also on Cefepime.  SCr has improved since Vancomycin stopped (changed to dapto)  Goal of Therapy:  Eradication of infection  Plan:  Daptomycin 8mg /kg IV q24 hours.  Used ABWt due to obesity.(800 mg IV q24 hours) Increase Cefepime to 2gm IV q24hrs Monitor labs, renal fxn,  CK, progress, c/s  Hart Robinsons A, RPH 11/27/2016,1:42 PM

## 2016-11-27 NOTE — Evaluation (Signed)
Physical Therapy Evaluation Patient Details Name: Eric Scott MRN: [id number] DOB: February 10, 1951 Today's Date: 11/27/2016   History of Present Illness  66 yo male with R arm PICC with vanc, AKI, has R 5th toe and L calc wounds, osteomyelitis.  MD expects L transtib and R 5th toe amputations in July 2018. Has N and diarrhea, on enteric precautions but susp the IV ABT is source of diarrhea, cleared for sepsis.   PMHx:  PVD, DM, CVA with L hemi,   Clinical Impression  Pt was assisted to roll and attempted to get up to side of bed with pt being in too much pain.  He is able to roll with max assist, very weak and skin is macerated on peri area from his GI condition.  Will work to strengthen him to sit up and try to mobilize side of bed, progress to sliding board as his skin and condition allow.  Will transition to home in SNF and will have follow up there as is appropriate.    Follow Up Recommendations SNF    Equipment Recommendations  None recommended by PT    Recommendations for Other Services       Precautions / Restrictions Precautions Precautions: Fall Precaution Comments: enteric, MRSA wounds Restrictions Weight Bearing Restrictions: No      Mobility  Bed Mobility Overal bed mobility: Needs Assistance Bed Mobility: Rolling;Supine to Sit Rolling: Max assist   Supine to sit: Total assist     General bed mobility comments: Pt is able to attempt to sit but then is in pain from his skin  Transfers Overall transfer level: Needs assistance               General transfer comment: Pt did not get up due to peri pain  Ambulation/Gait             General Gait Details: non ambulatory baseline  Stairs            Wheelchair Mobility    Modified Rankin (Stroke Patients Only) Modified Rankin (Stroke Patients Only) Pre-Morbid Rankin Score: Moderate disability Modified Rankin: Severe disability     Balance                                              Pertinent Vitals/Pain Pain Assessment: Faces Faces Pain Scale: Hurts even more Pain Location: peri area due to skin maceration from diarrhea Pain Descriptors / Indicators: Burning;Sore Pain Intervention(s): Limited activity within patient's tolerance;Monitored during session;Repositioned;Other (comment) (cleaned up the peri area)    Home Living Family/patient expects to be discharged to:: Skilled nursing facility                      Prior Function Level of Independence: Needs assistance   Gait / Transfers Assistance Needed: mobility with assisted transfers and using power wc  ADL's / Homemaking Assistance Needed: SNF staff assisting him        Hand Dominance        Extremity/Trunk Assessment   Upper Extremity Assessment Upper Extremity Assessment: Generalized weakness;LUE deficits/detail;RUE deficits/detail RUE Deficits / Details: weak grasp and poor functional reach RUE: Unable to fully assess due to immobilization RUE Coordination: decreased fine motor;decreased gross motor LUE Deficits / Details: L arm has tone in flexors and is not used to assist rolling functionally, R UE weak with poor grasp LUE  Coordination: decreased fine motor;decreased gross motor    Lower Extremity Assessment Lower Extremity Assessment: Generalized weakness    Cervical / Trunk Assessment Cervical / Trunk Assessment: Kyphotic  Communication   Communication: Expressive difficulties (difficult speech to understand at times)  Cognition Arousal/Alertness: Awake/alert Behavior During Therapy: Restless Overall Cognitive Status: No family/caregiver present to determine baseline cognitive functioning                                        General Comments General comments (skin integrity, edema, etc.): Wounds have no WBing information but functionally is a dependent transfer right now, pt cannot elaborate on usual amount of assistance needed to get up     Exercises     Assessment/Plan    PT Assessment Patient needs continued PT services  PT Problem List Decreased strength;Decreased range of motion;Decreased activity tolerance;Decreased balance;Decreased mobility;Decreased coordination;Decreased cognition;Decreased knowledge of use of DME;Decreased knowledge of precautions;Pain;Obesity;Decreased skin integrity       PT Treatment Interventions DME instruction;Therapeutic activities;Functional mobility training;Therapeutic exercise;Balance training;Neuromuscular re-education;Patient/family education    PT Goals (Current goals can be found in the Care Plan section)  Acute Rehab PT Goals Patient Stated Goal: none stated as he is  hurting PT Goal Formulation: With patient Time For Goal Achievement: 12/11/16 Potential to Achieve Goals: Fair    Frequency Min 3X/week   Barriers to discharge   lives in SNF and will have assistance for mobility    Co-evaluation               AM-PAC PT "6 Clicks" Daily Activity  Outcome Measure Difficulty turning over in bed (including adjusting bedclothes, sheets and blankets)?: Total Difficulty moving from lying on back to sitting on the side of the bed? : Total Difficulty sitting down on and standing up from a chair with arms (e.g., wheelchair, bedside commode, etc,.)?: Total Help needed moving to and from a bed to chair (including a wheelchair)?: Total Help needed walking in hospital room?: Total Help needed climbing 3-5 steps with a railing? : Total 6 Click Score: 6    End of Session   Activity Tolerance: Patient limited by pain;Patient limited by fatigue Patient left: in bed;with call bell/phone within reach;with bed alarm set Nurse Communication: Other (comment) (notified nursing that his cath was off when PT arrived in ro) PT Visit Diagnosis: Muscle weakness (generalized) (M62.81);Difficulty in walking, not elsewhere classified (R26.2)    Time: 1030-1057 PT Time Calculation (min)  (ACUTE ONLY): 27 min   Charges:   PT Evaluation $PT Eval Moderate Complexity: 1 Procedure PT Treatments $Therapeutic Activity: 8-22 mins   PT G Codes:   PT G-Codes **NOT FOR INPATIENT CLASS** Functional Assessment Tool Used: AM-PAC 6 Clicks Basic Mobility    Ramond Dial 11/27/2016, 11:39 AM   Mee Hives, PT MS Acute Rehab Dept. Number: [place] [facility] [place]

## 2016-11-27 NOTE — Clinical Social Work Note (Signed)
LCSW provided status update to facility.        Blaise Grieshaber, Clydene Pugh, LCSW

## 2016-11-28 ENCOUNTER — Encounter (HOSPITAL_COMMUNITY): Payer: Medicare Other

## 2016-11-28 DIAGNOSIS — I959 Hypotension, unspecified: Principal | ICD-10-CM

## 2016-11-28 DIAGNOSIS — M8600 Acute hematogenous osteomyelitis, unspecified site: Secondary | ICD-10-CM

## 2016-11-28 LAB — CULTURE, BLOOD (ROUTINE X 2)
CULTURE: NO GROWTH
Culture: NO GROWTH
SPECIAL REQUESTS: ADEQUATE
Special Requests: ADEQUATE

## 2016-11-28 LAB — GLUCOSE, CAPILLARY
GLUCOSE-CAPILLARY: 158 mg/dL — AB (ref 65–99)
GLUCOSE-CAPILLARY: 203 mg/dL — AB (ref 65–99)
Glucose-Capillary: 143 mg/dL — ABNORMAL HIGH (ref 65–99)
Glucose-Capillary: 130 mg/dL — ABNORMAL HIGH (ref 65–99)

## 2016-11-28 LAB — CBC WITH DIFFERENTIAL/PLATELET
Basophils Absolute: 0 10*3/uL (ref 0.0–0.1)
Basophils Relative: 0 %
Eosinophils Absolute: 0.1 10*3/uL (ref 0.0–0.7)
Eosinophils Relative: 1 %
HEMATOCRIT: 31 % — AB (ref 39.0–52.0)
HEMOGLOBIN: 10.6 g/dL — AB (ref 13.0–17.0)
LYMPHS ABS: 1.1 10*3/uL (ref 0.7–4.0)
LYMPHS PCT: 13 %
MCH: 33.3 pg (ref 26.0–34.0)
MCHC: 34.2 g/dL (ref 30.0–36.0)
MCV: 97.5 fL (ref 78.0–100.0)
MONOS PCT: 6 %
Monocytes Absolute: 0.5 10*3/uL (ref 0.1–1.0)
NEUTROS ABS: 7 10*3/uL (ref 1.7–7.7)
NEUTROS PCT: 80 %
Platelets: 219 10*3/uL (ref 150–400)
RBC: 3.18 MIL/uL — AB (ref 4.22–5.81)
RDW: 15.6 % — ABNORMAL HIGH (ref 11.5–15.5)
WBC: 8.7 10*3/uL (ref 4.0–10.5)

## 2016-11-28 LAB — BASIC METABOLIC PANEL
Anion gap: 7 (ref 5–15)
BUN: 25 mg/dL — AB (ref 6–20)
CHLORIDE: 113 mmol/L — AB (ref 101–111)
CO2: 21 mmol/L — AB (ref 22–32)
CREATININE: 1.65 mg/dL — AB (ref 0.61–1.24)
Calcium: 8.1 mg/dL — ABNORMAL LOW (ref 8.9–10.3)
GFR calc Af Amer: 48 mL/min — ABNORMAL LOW (ref >60)
GFR calc non Af Amer: 42 mL/min — ABNORMAL LOW (ref >60)
Glucose, Bld: 133 mg/dL — ABNORMAL HIGH (ref 65–99)
POTASSIUM: 2.6 mmol/L — AB (ref 3.5–5.1)
SODIUM: 141 mmol/L (ref 135–145)

## 2016-11-28 LAB — MAGNESIUM: Magnesium: 1.3 mg/dL — ABNORMAL LOW (ref 1.7–2.4)

## 2016-11-28 MED ORDER — POTASSIUM CHLORIDE CRYS ER 20 MEQ PO TBCR
40.0000 meq | EXTENDED_RELEASE_TABLET | ORAL | Status: AC
  Administered 2016-11-28 (×4): 40 meq via ORAL
  Filled 2016-11-28 (×4): qty 2

## 2016-11-28 MED ORDER — GERHARDT'S BUTT CREAM
TOPICAL_CREAM | Freq: Three times a day (TID) | CUTANEOUS | Status: DC
  Filled 2016-11-28: qty 1

## 2016-11-28 MED ORDER — SACCHAROMYCES BOULARDII 250 MG PO CAPS
250.0000 mg | ORAL_CAPSULE | Freq: Three times a day (TID) | ORAL | Status: DC
  Administered 2016-11-28 – 2016-11-29 (×3): 250 mg via ORAL
  Filled 2016-11-28 (×3): qty 1

## 2016-11-28 NOTE — Progress Notes (Addendum)
CRITICAL VALUE ALERT  Critical Value:K 2.6  Date & Time Notied:  11/28/16 0750  Provider Notified: Dr. Jerilee Hoh  Orders Received/Actions taken: Orders entered by MD

## 2016-11-28 NOTE — Progress Notes (Signed)
PROGRESS NOTE    Eric Scott  OZY:[id number] DOB: 12-30-1950 DOA: 11/22/2016 PCP: [name], MD     Brief Narrative:  66 year old man admitted to the hospital on 6/21 from nursing facility due to hypotension. He was discharged from the hospital on 6/10 with a PICC line to receive IV vancomycin and cefepime due to osteomyelitis of the right fifth toe and left calcaneus, was supposed to have outpatient follow-up with Dr. Sharol Given but this did not occur before his readmission. This hospitalization was also found to have acute renal failure that has improved as well as C. difficile colitis with high stool volume.   Assessment & Plan:   Active Problems:   DM type 2 (diabetes mellitus, type 2) ([name])   AKI (acute kidney injury) ([facility])   Gallstone   Wounds, multiple   Osteomyelitis ([name])   Volume depletion   Hypotension   Liver enzyme elevation   Hypotension -Thought secondary to volume depletion from GI losses, has resolved with IV fluids  Acute renal failure -Creatinine has decreased to 1.6 today, continue fluids for now. -Due to volume depletion plus minus vancomycin effects.  C. difficile colitis -On oral vancomycin, stool volume appears to be decreasing. -To continue oral vanc for 14 days.  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 Sharol Given for definitive treatment. It appears orthopedics is planning on a left BKA and a right fifth ray amputation. -Vancomycin has been switched over to daptomycin given renal toxicity.  Type 2 diabetes -Fair Control, continue current regimen  Hypokalemia -Due to GI losses, replace orally -Check magnesium   DVT prophylaxis: Subcutaneous heparin Code Status: Full code Family Communication: Patient only Disposition Plan: Likely DC SNF in 24-48 hours  Consultants:   None  Procedures:   None  Antimicrobials:  Anti-infectives    Start     Dose/Rate Route  Frequency Ordered Stop   11/28/16 0500  ceFEPIme (MAXIPIME) 2 g in dextrose 5 % 50 mL IVPB     2 g 100 mL/hr over 30 Minutes Intravenous Every 24 hours 11/27/16 1342     11/27/16 1000  vancomycin (VANCOCIN) 50 mg/mL oral solution 125 mg     125 mg Oral 4 times daily 11/27/16 0735 12/11/16 0959   11/25/16 1300  DAPTOmycin (CUBICIN) 800 mg in sodium chloride 0.9 % IVPB     800 mg 232 mL/hr over 30 Minutes Intravenous Every 24 hours 11/25/16 1241     11/24/16 0500  ceFEPIme (MAXIPIME) 1 g in dextrose 5 % 50 mL IVPB  Status:  Discontinued     1 g 100 mL/hr over 30 Minutes Intravenous Every 24 hours 11/23/16 0752 11/27/16 1342   11/23/16 0500  ceFEPIme (MAXIPIME) 2 g in dextrose 5 % 50 mL IVPB  Status:  Discontinued    Comments:  Monitor labs and make dose adjustments per pharmacy protocol and input.     2 g 100 mL/hr over 30 Minutes Intravenous Every 12 hours 11/23/16 0449 11/23/16 0752   11/23/16 0230  vancomycin (VANCOCIN) IVPB 1000 mg/200 mL premix     1,000 mg 200 mL/hr over 60 Minutes Intravenous Every 1 hr x 2 11/23/16 0131 11/23/16 0556   11/23/16 0130  piperacillin-tazobactam (ZOSYN) IVPB 3.375 g     3.375 g 100 mL/hr over 30 Minutes Intravenous  Once 11/23/16 0119 11/23/16 0154   11/23/16 0130  vancomycin (VANCOCIN) IVPB 1000 mg/200 mL premix  Status:  Discontinued     1,000 mg 200 mL/hr over 60 Minutes Intravenous  Once 11/23/16 0119 11/23/16 0131       Subjective: States stool volume has decreased, complains of irritated bottom  Objective: Vitals:   11/27/16 0830 11/27/16 2105 11/28/16 0613 11/28/16 0815  BP:  140/72 132/66   Pulse:  66 70   Resp:  18 18   Temp:  98.2 F (36.8 C) 98.4 F (36.9 C)   TempSrc:  Oral Oral   SpO2: 99% 98% 98% 96%  Weight:      Height:        Intake/Output Summary (Last 24 hours) at 11/28/16 1647 Last data filed at 11/28/16 1600  Gross per 24 hour  Intake          2259.75 ml  Output             1100 ml  Net          1159.75 ml    Filed Weights   11/23/16 1608  Weight: 125.2 kg (276 lb)    Examination:  General exam: Alert, awake, oriented x 3 Respiratory system: Clear to auscultation. Respiratory effort normal. Cardiovascular system:RRR. No murmurs, rubs, gallops. Gastrointestinal system: Abdomen is nondistended, soft and nontender. No organomegaly or masses felt. Normal bowel sounds heard. Central nervous system: Alert and oriented. No focal neurological deficits. Extremities: No C/C/E, +pedal pulses Skin: No rashes, lesions or ulcers Psychiatry: Judgement and insight appear normal. Mood & affect appropriate.     Data Reviewed: I have personally reviewed following labs and imaging studies  CBC:  Recent Labs Lab 11/22/16 2325 11/24/16 1111 11/25/16 0545 11/26/16 0633 11/28/16 0611  WBC 16.0* 8.5 8.2 6.8 8.7  NEUTROABS 14.2*  --  5.8 4.9 7.0  HGB 12.0* 10.6* 10.3* 10.4* 10.6*  HCT 35.8* 31.4* 30.2* 30.5* 31.0*  MCV 98.9 98.4 99.0 98.1 97.5  PLT 268 210 212 203 664   Basic Metabolic Panel:  Recent Labs Lab 11/25/16 0545 11/26/16 0633 11/27/16 0656 11/27/16 1451 11/28/16 0611  NA 141 139 140 140 141  K 3.3* 3.0* 2.9* 2.9* 2.6*  CL 113* 111 112* 112* 113*  CO2 20* 20* 20* 21* 21*  GLUCOSE 104* 117* 114* 184* 133*  BUN 50* 41* 32* 31* 25*  CREATININE 2.45* 2.11* 1.82* 1.75* 1.65*  CALCIUM 7.8* 8.0* 7.9* 8.0* 8.1*  MG  --   --   --   --  1.3*   GFR: Estimated Creatinine Clearance: 63.7 mL/min (A) (by C-G formula based on SCr of 1.65 mg/dL (H)). Liver Function Tests:  Recent Labs Lab 11/22/16 2325 11/24/16 0609 11/26/16 0633  AST 88* 214* 79*  ALT 116* 173* 115*  ALKPHOS 333* 454* 358*  BILITOT 1.2 1.8* 0.7  PROT 6.4* 6.8 6.4*  ALBUMIN 2.7* 2.7* 2.4*   No results for input(s): LIPASE, AMYLASE in the last 168 hours. No results for input(s): AMMONIA in the last 168 hours. Coagulation Profile: No results for input(s): INR, PROTIME in the last 168 hours. Cardiac  Enzymes:  Recent Labs Lab 11/23/16 0922 11/26/16 0633  CKTOTAL 20* 14*   BNP (last 3 results) No results for input(s): PROBNP in the last 8760 hours. HbA1C: No results for input(s): HGBA1C in the last 72 hours. CBG:  Recent Labs Lab 11/27/16 1134 11/27/16 1614 11/27/16 2102 11/28/16 0811 11/28/16 1146  GLUCAP 142* 176* 149* 143* 158*   Lipid Profile: No results for input(s): CHOL, HDL, LDLCALC, TRIG, CHOLHDL, LDLDIRECT in the last 72  hours. Thyroid Function Tests: No results for input(s): TSH, T4TOTAL, FREET4, T3FREE, THYROIDAB in the last 72 hours. Anemia Panel: No results for input(s): VITAMINB12, FOLATE, FERRITIN, TIBC, IRON, RETICCTPCT in the last 72 hours. Urine analysis:    Component Value Date/Time   COLORURINE YELLOW 11/23/2016 1550   APPEARANCEUR CLOUDY (A) 11/23/2016 1550   LABSPEC 1.010 11/23/2016 1550   PHURINE 5.0 11/23/2016 1550   GLUCOSEU NEGATIVE 11/23/2016 1550   HGBUR MODERATE (A) 11/23/2016 1550   BILIRUBINUR NEGATIVE 11/23/2016 1550   KETONESUR NEGATIVE 11/23/2016 1550   PROTEINUR NEGATIVE 11/23/2016 1550   UROBILINOGEN 0.2 03/04/2015 1835   NITRITE NEGATIVE 11/23/2016 1550   LEUKOCYTESUR NEGATIVE 11/23/2016 1550   Sepsis Labs: @LABRCNTIP (procalcitonin:4,lacticidven:4)  ) Recent Results (from the past 240 hour(s))  Blood Culture (routine x 2)     Status: None   Collection Time: 11/22/16 11:39 PM  Result Value Ref Range Status   Specimen Description BLOOD LEFT ARM  Final   Special Requests   Final    BOTTLES DRAWN AEROBIC AND ANAEROBIC Blood Culture adequate volume   Culture NO GROWTH 5 DAYS  Final   Report Status 11/28/2016 FINAL  Final  Blood Culture (routine x 2)     Status: None   Collection Time: 11/22/16 11:46 PM  Result Value Ref Range Status   Specimen Description BLOOD LEFT ARM  Final   Special Requests   Final    Blood Culture adequate volume BOTTLES DRAWN AEROBIC AND ANAEROBIC   Culture NO GROWTH 5 DAYS  Final   Report  Status 11/28/2016 FINAL  Final  Urine culture     Status: None   Collection Time: 11/23/16  3:40 PM  Result Value Ref Range Status   Specimen Description URINE, CATHETERIZED  Final   Special Requests NONE  Final   Culture   Final    NO GROWTH Performed at [facility], [place] [address] [place], [place] [address]    Report Status 11/25/2016 FINAL  Final  C difficile quick scan w PCR reflex     Status: Abnormal   Collection Time: 11/26/16  4:55 PM  Result Value Ref Range Status   C Diff antigen POSITIVE (A) NEGATIVE Final   C Diff toxin NEGATIVE NEGATIVE Final   C Diff interpretation Results are indeterminate. See PCR results.  Final  Clostridium Difficile by PCR     Status: Abnormal   Collection Time: 11/26/16  4:55 PM  Result Value Ref Range Status   Toxigenic C Difficile by pcr POSITIVE (A) NEGATIVE Final    Comment: Positive for toxigenic C. difficile with little to no toxin production. Only treat if clinical presentation suggests symptomatic illness.         Radiology Studies: No results found.      Scheduled Meds: . ALPRAZolam  1 mg Oral TID  . atorvastatin  20 mg Oral BID  . citalopram  20 mg Oral Daily  . collagenase   Topical Daily  . feeding supplement (ENSURE ENLIVE)  237 mL Oral BID BM  . fluticasone furoate-vilanterol  1 puff Inhalation Daily  . heparin  5,000 Units Subcutaneous Q8H  . insulin aspart  0-5 Units Subcutaneous QHS  . insulin aspart  0-9 Units Subcutaneous TID WC  . potassium chloride  40 mEq Oral Q4H  . saccharomyces boulardii  250 mg Oral TID  . vancomycin  125 mg Oral QID   Continuous Infusions: . sodium chloride 75 mL/hr at 11/28/16 1251  . ceFEPime (MAXIPIME)  IV Stopped (11/28/16 9702)  . DAPTOmycin (CUBICIN)  IV Stopped (11/28/16 1319)     LOS: 5 days    Time spent: 30 minutes. Greater than 50% of this time was spent in direct contact with the patient coordinating care.     Lelon Frohlich, MD [facility] Pager [phone]  If 7PM-7AM, please contact night-coverage [email or web address] Password [facility] 11/28/2016, 4:47 PM

## 2016-11-28 NOTE — Care Management Important Message (Signed)
Important Message  Patient Details  Name: [name] MRN: [id number] Date of Birth: Jul 30, 1950   Medicare Important Message Given:  Yes    Mashayla Lavin, Chauncey Reading, RN 11/28/2016, 3:43 PM

## 2016-11-29 LAB — CBC
HEMATOCRIT: 31.6 % — AB (ref 39.0–52.0)
Hemoglobin: 10.8 g/dL — ABNORMAL LOW (ref 13.0–17.0)
MCH: 33.1 pg (ref 26.0–34.0)
MCHC: 34.2 g/dL (ref 30.0–36.0)
MCV: 96.9 fL (ref 78.0–100.0)
Platelets: 207 10*3/uL (ref 150–400)
RBC: 3.26 MIL/uL — ABNORMAL LOW (ref 4.22–5.81)
RDW: 15.5 % (ref 11.5–15.5)
WBC: 9.4 10*3/uL (ref 4.0–10.5)

## 2016-11-29 LAB — BASIC METABOLIC PANEL
Anion gap: 8 (ref 5–15)
BUN: 20 mg/dL (ref 6–20)
CALCIUM: 8.2 mg/dL — AB (ref 8.9–10.3)
CO2: 20 mmol/L — ABNORMAL LOW (ref 22–32)
CREATININE: 1.52 mg/dL — AB (ref 0.61–1.24)
Chloride: 114 mmol/L — ABNORMAL HIGH (ref 101–111)
GFR calc Af Amer: 53 mL/min — ABNORMAL LOW (ref >60)
GFR, EST NON AFRICAN AMERICAN: 46 mL/min — AB (ref >60)
GLUCOSE: 152 mg/dL — AB (ref 65–99)
POTASSIUM: 3.5 mmol/L (ref 3.5–5.1)
SODIUM: 142 mmol/L (ref 135–145)

## 2016-11-29 LAB — GLUCOSE, CAPILLARY
GLUCOSE-CAPILLARY: 147 mg/dL — AB (ref 65–99)
GLUCOSE-CAPILLARY: 185 mg/dL — AB (ref 65–99)

## 2016-11-29 MED ORDER — VANCOMYCIN 50 MG/ML ORAL SOLUTION: 125.0000 mg | Freq: Four times a day (QID) | ORAL | Status: DC

## 2016-11-29 MED ORDER — CEFEPIME HCL 2 G IJ SOLR
2.0000 g | Freq: Two times a day (BID) | INTRAMUSCULAR | Status: DC
  Filled 2016-11-29 (×2): qty 2

## 2016-11-29 MED ORDER — SACCHAROMYCES BOULARDII 250 MG PO CAPS: 250.0000 mg | ORAL_CAPSULE | Freq: Three times a day (TID) | ORAL | Status: DC

## 2016-11-29 MED ORDER — SODIUM CHLORIDE 0.9 % IV SOLN: 800.0000 mg | INTRAVENOUS | Status: DC

## 2016-11-29 NOTE — Evaluation (Signed)
Physical Therapy Evaluation Patient Details Name: Eric Scott MRN: [id number] DOB: [birth year] Today's Date: 11/29/2016   History of Present Illness  66 yo male with R arm PICC with vanc, AKI, has R 5th toe and L calc wounds, osteomyelitis.  MD expects L transtib and R 5th toe amputations in July 2018. Has N and diarrhea, on enteric precautions but susp the IV ABT is source of diarrhea, cleared for sepsis.   PMHx:  PVD, DM, CVA with L hemi,   Clinical Impression  Pt is up to chair with minimal issues, has been able to assist with UE's and LE's but avoiding WBing on feet makes this a struggle along with flexiseal.  He is expecting to return to SNF today, but will need to be assessed there by PT to determine how he compares to PLOF.  He is appropriate for sliding board if flexi is removed but may not be able to do this without dislocating flexi.  Continue acute transfers and exercise until dc.    Follow Up Recommendations SNF    Equipment Recommendations  None recommended by PT    Recommendations for Other Services       Precautions / Restrictions Precautions Precautions: Fall Precaution Comments: enteric, MRSA wounds, flexiseal Restrictions Weight Bearing Restrictions: No (but has no information from MD about foot wounds)      Mobility  Bed Mobility Overal bed mobility: Needs Assistance Bed Mobility: Rolling;Supine to Sit Rolling: Min guard   Supine to sit: Min assist     General bed mobility comments: sat up side of bed and pt able to help a bit with scooting backward to chair  Transfers Overall transfer level: Needs assistance Equipment used: 2 person hand held assist (AP transfer to chair due to foot wounds) Transfers: Comptroller transfers: Mod assist;+2 physical assistance;+2 safety/equipment   General transfer comment: up to chair with legs elevated and heels floating  Ambulation/Gait             General Gait  Details: non ambulatory  Stairs            Wheelchair Mobility    Modified Rankin (Stroke Patients Only) Modified Rankin (Stroke Patients Only) Pre-Morbid Rankin Score: Moderate disability Modified Rankin: Moderately severe disability     Balance Overall balance assessment: Needs assistance Sitting-balance support: Bilateral upper extremity supported Sitting balance-Leahy Scale: Fair                                       Pertinent Vitals/Pain Pain Assessment: Faces Faces Pain Scale: Hurts a little bit Pain Location: periarea from skin maceration from C-diff Pain Intervention(s): Repositioned;Monitored during session    Home Living                        Prior Function                 Hand Dominance        Extremity/Trunk Assessment                Communication      Cognition Arousal/Alertness: Awake/alert Behavior During Therapy: WFL for tasks assessed/performed Overall Cognitive Status: Within Functional Limits for tasks assessed  General Comments      Exercises     Assessment/Plan    PT Assessment    PT Problem List         PT Treatment Interventions      PT Goals (Current goals can be found in the Care Plan section)  Acute Rehab PT Goals Patient Stated Goal: get back to his SNF    Frequency Min 3X/week   Barriers to discharge        Co-evaluation               AM-PAC PT "6 Clicks" Daily Activity  Outcome Measure Difficulty turning over in bed (including adjusting bedclothes, sheets and blankets)?: A Little Difficulty moving from lying on back to sitting on the side of the bed? : A Little Difficulty sitting down on and standing up from a chair with arms (e.g., wheelchair, bedside commode, etc,.)?: Total Help needed moving to and from a bed to chair (including a wheelchair)?: A Lot Help needed walking in hospital room?: Total Help  needed climbing 3-5 steps with a railing? : Total 6 Click Score: 11    End of Session   Activity Tolerance: Patient limited by fatigue;Treatment limited secondary to medical complications (Comment) (wounds on LE's) Patient left: in chair;with call bell/phone within reach;with chair alarm set;with nursing/sitter in room Nurse Communication: Mobility status;Other (comment) (flexi in place due to use of bed pad to slide to chair) PT Visit Diagnosis: Muscle weakness (generalized) (M62.81);Difficulty in walking, not elsewhere classified (R26.2)    Time: 3893-7342 PT Time Calculation (min) (ACUTE ONLY): 27 min   Charges:     PT Treatments $Therapeutic Activity: 23-37 mins   PT G Codes:   PT G-Codes **NOT FOR INPATIENT CLASS** Functional Assessment Tool Used: AM-PAC 6 Clicks Basic Mobility    Ramond Dial 11/29/2016, 1:22 PM  1:24 PM, 11/29/16 Mee Hives, PT, MS Physical Therapist - [place] [phone] (Office)

## 2016-11-29 NOTE — Clinical Social Work Note (Signed)
LCSW left a message for patient's niece listed on chart advising that patient was discharging and being transported to [facility] via ambulance.    LCSW spoke with Debbie at [facility] and advised of discharge.    LCSW sent clinicals via epic hub.   LCSW signing off.      Makinlee Awwad, Clydene Pugh, LCSW

## 2016-11-29 NOTE — Discharge Summary (Signed)
Physician Discharge Summary  Eric Scott NFA:[id number] DOB: [birth year] DOA: 11/22/2016  PCP: [name], MD  Admit date: 11/22/2016 Discharge date: 11/29/2016  Time spent: 45 minutes  Recommendations for Outpatient Follow-up:  -Will be discharged back to SNF today. -To continue IV daptomycin and cefepime until seen by Dr. Sharol Given to determine definitive treatment of his osteomyelitis. -PO vanc for 14 days for treatment of C. Diff Colitis.   Discharge Diagnoses:  Active Problems:   DM type 2 (diabetes mellitus, type 2) ([name])   AKI (acute kidney injury) ([place])   Gallstone   Wounds, multiple   Osteomyelitis ([name])   Volume depletion   Hypotension   Liver enzyme elevation   Discharge Condition: Stable and improved  Filed Weights   11/23/16 1608  Weight: 125.2 kg (276 lb)    History of present illness:  As per Dr. Marin Comment on 6/22: Eric Scott is an 66 y.o. male with hx of anxiety, chronic pain, recently discharged on IV Van and Cefepime IV thru his right arm PICC line for osteomyelitis, DM, PVD, prior lacunar infarct, brought to the ER as he was found to be hypotensive and diaphoretic.  His BS was 140, Cr was found to be at 4.09, and WBC of 16K.  His BP was soft, but after 2 L [place], BP rose to 105 and remained stable.  His lactic acid was negative.  BC was done, and hospitalist was asked to admit him for possible sepsis, volume depletion, and AKI.    Hospital Course:   Hypotension -Thought secondary to volume depletion from GI losses, has resolved with IV fluids  Acute renal failure -Creatinine has decreased to 1.52 today. -Due to volume depletion plus minus vancomycin effects.  C. difficile colitis -On oral vancomycin, stool volume appears to be decreasing. -To continue oral vanc for 14 days.  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 Sharol Given for definitive treatment. It appears orthopedics is  planning on a left BKA and a right fifth ray amputation. -Vancomycin has been switched over to daptomycin given renal toxicity.  Type 2 diabetes -Fair Control, continue current regimen  Hypokalemia -Due to GI losses. -Replaced    Procedures:  None   Consultations:  None  Discharge Instructions  Discharge Instructions    Diet - low sodium heart healthy    Complete by:  As directed    Increase activity slowly    Complete by:  As directed      Allergies as of 11/29/2016      Reactions   Blueberry Flavor    Cucumber Extract Other (See Comments)   "Fells like I'm having a heart attack"   Flexeril [cyclobenzaprine Hcl] Other (See Comments)   "whole body  Tremors" (05/27/2012)   Kiwi Extract Other (See Comments)   "feels like I'm having a heart attack"      Medication List    STOP taking these medications   furosemide 80 MG tablet Commonly known as:  LASIX   ipratropium 0.02 % nebulizer solution Commonly known as:  ATROVENT   lisinopril 5 MG tablet Commonly known as:  PRINIVIL,ZESTRIL   morphine 15 MG 12 hr tablet Commonly known as:  MS CONTIN   vancomycin 1-5 GM/200ML-% Soln Commonly known as:  VANCOCIN     TAKE these medications   albuterol (2.5 MG/3ML) 0.083% nebulizer solution Commonly known as:  PROVENTIL Take 2.5 mg by nebulization every 3 (  three) hours as needed for wheezing or shortness of breath.   ALPRAZolam 1 MG tablet Commonly known as:  XANAX Take 1 tablet (1 mg total) by mouth 3 (three) times daily.   atorvastatin 20 MG tablet Commonly known as:  LIPITOR Take 20 mg by mouth 2 (two) times daily.   BREO ELLIPTA 100-25 MCG/INH Aepb Generic drug:  fluticasone furoate-vilanterol   ceFEPIme 2 g in dextrose 5 % 50 mL Inject 2 g into the vein every 12 (twelve) hours. Monitor labs and make dose adjustments per pharmacy protocol and input.   citalopram 20 MG tablet Commonly known as:  CELEXA Take 20 mg by mouth daily.   collagenase  ointment Commonly known as:  SANTYL Apply topically daily. Apply Santyl to left heel and right outer foot Q day, then cover with moist gauze, then kerlex and tape.   DAPTOmycin 800 mg in sodium chloride 0.9 % 100 mL Inject 800 mg into the vein daily.   fish oil-omega-3 fatty acids 1000 MG capsule Take 1 g by mouth 2 (two) times daily.   gabapentin 800 MG tablet Commonly known as:  NEURONTIN Take 800 mg by mouth 3 (three) times daily.   HUMALOG KWIKPEN 100 UNIT/ML KiwkPen Generic drug:  insulin lispro Per sliding scale   ipratropium-albuterol 0.5-2.5 (3) MG/3ML Soln Commonly known as:  DUONEB Inhale 3 mLs into the lungs 3 (three) times daily.   l-methylfolate-B6-B12 3-35-2 MG Tabs tablet Commonly known as:  METANX Take 1 tablet by mouth 2 (two) times daily.   LANTUS SOLOSTAR 100 UNIT/ML Solostar Pen Generic drug:  Insulin Glargine 24 Units daily at 10 pm.   oxyCODONE 5 MG immediate release tablet Commonly known as:  Oxy IR/ROXICODONE Take 1 tablet (5 mg total) by mouth every 8 (eight) hours as needed for severe pain.   saccharomyces boulardii 250 MG capsule Commonly known as:  FLORASTOR Take 1 capsule (250 mg total) by mouth 3 (three) times daily.   vancomycin 50 mg/mL oral solution Commonly known as:  VANCOCIN Take 2.5 mLs (125 mg total) by mouth 4 (four) times daily.   Vitamin D (Ergocalciferol) 50000 units Caps capsule Commonly known as:  DRISDOL Take 50,000 Units by mouth every 30 (thirty) days.   white petrolatum Gel Commonly known as:  VASELINE Apply 1 application topically 2 (two) times daily.      Allergies  Allergen Reactions  . Blueberry Flavor   . Cucumber Extract Other (See Comments)    "Fells like I'm having a heart attack"  . Flexeril [Cyclobenzaprine Hcl] Other (See Comments)    "whole body  Tremors" (05/27/2012)  . Kiwi Extract Other (See Comments)    "feels like I'm having a heart attack"    Contact information for follow-up providers     [name], MD. Schedule an appointment as soon as possible for a visit in 1 week(s).   Specialty:  Orthopedic Surgery Contact information: [place]  [id number] [phone]            Contact information for after-discharge care    [facility] Follow up.   Specialty:  [place] information: [address] [place] [phone]                   The results of significant diagnostics from this hospitalization (including imaging, microbiology, ancillary and laboratory) are listed below for reference.    Significant Diagnostic Studies: Ct  Abdomen Pelvis Wo Contrast  Result Date: 11/23/2016 CLINICAL DATA:  66 year old male with nausea and abdominal discomfort. Diaphoresis. Abdominal CT dated 10/25/2016 EXAM: CT ABDOMEN AND PELVIS WITHOUT CONTRAST TECHNIQUE: Multidetector CT imaging of the abdomen and pelvis was performed following the standard protocol without IV contrast. COMPARISON:  None. FINDINGS: Evaluation of this exam is limited in the absence of intravenous contrast. Lower chest: Minimal bibasilar atelectatic changes. There is a 4 mm left lung base nodule as seen on the prior CT. No intra-abdominal free air or free fluid. Hepatobiliary: The liver is unremarkable. No intrahepatic biliary ductal dilatation. There is a 3 cm stone within the gallbladder. The gallbladder is distended. No pericholecystic fluid. Ultrasound may provide better evaluation of the gallbladder clinically indicated. Pancreas: Unremarkable. No pancreatic ductal dilatation or surrounding inflammatory changes. Spleen: Normal in size without focal abnormality. 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 The left adrenal gland is unremarkable. Bilateral renal hypodense lesions are not well characterized on this unenhanced CT but appears similar to prior  study. A 2 cm hypodense lesion in the interpolar aspect of the left kidney demonstrates fluid attenuation and most likely represents a cyst. Subcentimeter exophytic right renal inferior pole lesion is not characterized. There is no hydronephrosis or nephrolithiasis on either side. The visualized ureters appear unremarkable. The previously described thickened appearance of the left bladder wall is less appreciated on this exam. Follow-up with cystoscopy as previously recommended. Stomach/Bowel: There is sigmoid diverticulosis without active inflammatory changes. No evidence of bowel obstruction or active inflammation. Normal appendix. Vascular/Lymphatic: There is moderate aortoiliac atherosclerotic disease. Evaluation of the vasculature is limited in the absence of intravenous contrast. No portal venous gas identified. Chest mildly enlarged external iliac lymph nodes as previously described. Follow-up evaluation as per recommendations of the prior CT. Reproductive: The prostate and seminal vesicles are grossly unremarkable. Other: Small fat containing bilateral inguinal and umbilical hernia. Musculoskeletal: Degenerative changes of the spine. Lower lumbar disc desiccation with vacuum phenomena. No acute fracture. IMPRESSION: 1. No hydronephrosis or nephrolithiasis. 2. Cholelithiasis. No pericholecystic fluid. Ultrasound may provide better evaluation of the gallbladder if clinically indicated. 3. Sigmoid diverticulosis. No bowel obstruction or active inflammation. Normal appendix. 4. Aortic Atherosclerosis (ICD10-I70.0). Electronically Signed   By: Anner Crete M.D.   On: 11/23/2016 02:41   Ct Head Wo Contrast  Result Date: 11/08/2016 CLINICAL DATA:  Altered mental status.  Patient status post fall. EXAM: CT HEAD WITHOUT CONTRAST TECHNIQUE: Contiguous axial images were obtained from the base of the skull through the vertex without intravenous contrast. COMPARISON:  Brain CT 05/29/2016. FINDINGS: Brain:  Periventricular and subcortical white matter hypodensity compatible with chronic microvascular ischemic changes. Old left basal ganglia lacunar infarct. Ventricles and sulci are prominent compatible with atrophy. No evidence for acute cortically based infarct, intracranial hemorrhage, mass lesion or mass-effect. Vascular: Unremarkable Skull: Intact. Sinuses/Orbits: Air-fluid levels within the maxillary sinuses bilaterally. Ethmoid air cell mucosal thickening. Orbits are unremarkable. Other: None. IMPRESSION: No acute intracranial process. 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 Air-fluid levels within the maxillary sinuses as can be seen with acute sinusitis in the appropriate clinical setting. Electronically Signed   By: Lovey Newcomer M.D.   On: 11/08/2016 21:35   Mr Foot Right Wo Contrast  Result Date: 11/09/2016 CLINICAL DATA:  Medical history significant of chronic wounds to ble, PVD, venous insufficiency Pt started antibiotics yesterday- DM, evaluate for osteomylitis Best obtainable images due to pt  condition- no repeats tolerated EXAM: MRI OF THE RIGHT FOREFOOT WITHOUT CONTRAST TECHNIQUE: Multiplanar, multisequence MR imaging of the right forefoot was performed. No intravenous contrast was administered. COMPARISON:  None. FINDINGS: Bones/Joint/Cartilage Soft tissue wound along the lateral aspect of the forefoot overlying the fifth metatarsal head. Severe bone marrow edema with cortical destruction of the fifth metatarsal head and fifth proximal phalanx. Severe osteoarthritis of the first MTP joint. Severe osteoarthritis of the talonavicular joint with subchondral reactive marrow edema. Moderate osteoarthritis of the subtalar joints. Normal alignment. No joint effusion. Ligaments Collateral ligaments are intact.  Intact Lisfranc ligament. Muscles and Tendons Generalized muscle atrophy. Flexor, extensor, peroneal and Achilles tendons are intact. Soft  tissue No fluid collection or hematoma.  No soft tissue mass. IMPRESSION: 1. Soft tissue wound along the lateral aspect of the fourth this overlying the fifth metatarsal head. Acute osteomyelitis of the fifth metatarsal head and fifth proximal phalanx. 2. Severe osteoarthritis of the first MTP joint. 3. Severe osteoarthritis of the talonavicular joint with subchondral reactive marrow edema. 4. Moderate osteoarthritis of the subtalar joints. Electronically Signed   By: Kathreen Devoid   On: 11/09/2016 11:05   Mr Foot Left Wo Contrast  Result Date: 11/09/2016 CLINICAL DATA:  Medical history significant of chronic wounds to bilateral lower extremities, PVD, venous insufficiency Pt started antibiotics yesterday. History of DM. Evaluate for osteomyelitis. EXAM: MRI OF THE LEFT FOOT WITHOUT CONTRAST TECHNIQUE: Multiplanar, multisequence MR imaging of the left forefoot was performed. No intravenous contrast was administered. COMPARISON:  None. FINDINGS: Significant patient motion degrading image quality limiting evaluation. Bones/Joint/Cartilage No acute fracture or dislocation. Severe osteoarthritis of the first MTP joint with subchondral reactive marrow edema. Mild osteoarthritis of the first IP joint. No significant joint effusion. Soft tissue wound along the plantar aspect of the posterior calcaneus extending to the cortex. Small area of cortical irregularity and underlying marrow edema which may reflect reactive marrow changes versus mild osteomyelitis. No other areas of periosteal reaction or bone destruction. No other marrow signal abnormality. Normal alignment. No joint effusion. Ligaments Collateral ligaments are intact.  Lisfranc ligament is intact. Muscles and Tendons Generalized muscle atrophy. Soft tissue Soft tissue wound along the plantar aspect of the posterior calcaneus. No focal drainable fluid collection. No soft tissue mass. IMPRESSION: 1. Soft tissue wound along the plantar aspect of the posterior  calcaneus extending to the cortex. Small area of cortical irregularity and underlying marrow edema which may reflect reactive marrow changes versus mild osteomyelitis. No drainable fluid collection to suggest an abscess. 2. Severe osteoarthritis of the first MTP joint with subchondral reactive marrow edema. Mild osteoarthritis of the first IP joint. Electronically Signed   By: Kathreen Devoid   On: 11/09/2016 10:54   Dg Chest Port 1 View  Result Date: 11/23/2016 CLINICAL DATA:  66 year old male with diaphoresis. EXAM: PORTABLE CHEST 1 VIEW COMPARISON:  Chest radiograph dated 11/08/2016 FINDINGS: There is a right sided PICC with tip at the cavoatrial junction. The lungs are clear. There is overall interval improvement of the previously seen interstitial edema. There is no pleural effusion or pneumothorax. There is enlargement of the cardiopericardial silhouette. Atherosclerotic calcification of the aortic arch. No acute osseous pathology. IMPRESSION: 1. Cardiomegaly.  No focal consolidation. 2. Interval improvement of the previously seen pulmonary edema. 3. Right-sided PICC with tip at the cavoatrial junction in stable positioning. Electronically Signed   By: Anner Crete M.D.   On: 11/23/2016 00:24   Dg Chest Portable 1 View  Result Date:  11/08/2016 CLINICAL DATA:  Fever, altered mental status and fall today. Hypoxia. EXAM: PORTABLE CHEST 1 VIEW COMPARISON:  03/04/2015 FINDINGS: Right-sided PICC line tip is noted in the distal SVC. Heart is enlarged but stable with aortic atherosclerosis. Interstitial edema is slightly more pronounced than on prior. No pneumonic consolidation or pneumothorax. No effusion. No acute nor suspicious osseous lesions. IMPRESSION: 1. Stable cardiomegaly with aortic atherosclerosis. 2. Slight interval increase in interstitial pulmonary edema. 3. PICC line tip in the distal SVC. Electronically Signed   By: Ashley Royalty M.D.   On: 11/08/2016 20:46   Dg Foot Complete Left  Result  Date: 11/09/2016 CLINICAL DATA:  Lower extremity infection EXAM: LEFT FOOT - COMPLETE 3+ VIEW COMPARISON:  None. FINDINGS: ORIF of the malleoli. Generalized osteopenia with osteoarthritis of the DIP and PIP joints of the toes, interphalangeal joint of the great toe and first MTP. No frank bone destruction to suggest osteomyelitis. No fracture or dislocations. Calcaneal enthesophyte along the plantar aspect. Tibiotalar, subtalar and midfoot osteoarthritis. Moderate diffuse soft tissue swelling about the ankle and foot. IMPRESSION: Osteoarthritis of the foot. Soft tissue swelling of the ankle and foot without underlying findings to suggest osteomyelitis. Electronically Signed   By: Ashley Royalty M.D.   On: 11/09/2016 01:04   Dg Foot Complete Right  Result Date: 11/09/2016 CLINICAL DATA:  Lower extremity infection.  Oozing wounds. EXAM: RIGHT FOOT COMPLETE - 3+ VIEW COMPARISON:  None. FINDINGS: Tapered appearance of the second middle phalangeal head, third distal tuft, fourth proximal head and distal phalangeal tuft as well as fifth proximal phalangeal head in a pattern suggesting inflammatory arthritis possibly psoriatic. Sclerosis and subtle cortical loss at the base of the right fifth proximal phalanx with bony sequestrum noted laterally consistent with changes of chronic osteomyelitis. Erosive osteoarthritic changes involving the first MTP. Soft tissue swelling noted of the ankle and foot with plantar calcaneal enthesophyte. Subtalar joint osteoarthritis characterized by joint space narrowing sclerosis as well as osteoarthritis of the midfoot articulations are also present. IMPRESSION: 1. Diffuse soft tissue swelling of the ankle and foot with bony destructive changes at the base of the right fifth proximal phalanx. There appears to be bony sequestrum along the lateral aspect of the forefoot. Findings raise concern for changes of chronic osteomyelitis. 2. Tapered appearance of the phalanges as above described which  can be seen in an inflammatory arthritis such as psoriasis or rheumatoid. 3. Calcaneal enthesophyte. 4. Osteoarthritis of the mid foot and subtalar joints. Electronically Signed   By: Ashley Royalty M.D.   On: 11/09/2016 01:00   US Abdomen Limited Ruq  Result Date: 11/24/2016 CLINICAL DATA:  66 year old male with cholelithiasis EXAM: ULTRASOUND ABDOMEN LIMITED RIGHT UPPER QUADRANT COMPARISON:  None. FINDINGS: Gallbladder: Echogenic gallstone in the dependent aspects of the gallbladder, located at the neck of the gallbladder measuring at least 3 cm. Posterior shadowing limits evaluation in the far field from the stone. No gallbladder wall thickening. Sonographic Murphy's sign was not recorded. No pericholecystic fluid. Common bile duct: Diameter: 8 mm Liver: Relatively unremarkable appearance of liver parenchyma. IMPRESSION: Cholelithiasis without sonographic evidence of acute cholecystitis Electronically Signed   By: Corrie Mckusick D.O.   On: 11/24/2016 13:48    Microbiology: Recent Results (from the past 240 hour(s))  Blood Culture (routine x 2)     Status: None   Collection Time: 11/22/16 11:39 PM  Result Value Ref Range Status   Specimen Description BLOOD LEFT ARM  Final   Special Requests   Final  BOTTLES DRAWN AEROBIC AND ANAEROBIC Blood Culture adequate volume   Culture NO GROWTH 5 DAYS  Final   Report Status 11/28/2016 FINAL  Final  Blood Culture (routine x 2)     Status: None   Collection Time: 11/22/16 11:46 PM  Result Value Ref Range Status   Specimen Description BLOOD LEFT ARM  Final   Special Requests   Final    Blood Culture adequate volume BOTTLES DRAWN AEROBIC AND ANAEROBIC   Culture NO GROWTH 5 DAYS  Final   Report Status 11/28/2016 FINAL  Final  Urine culture     Status: None   Collection Time: 11/23/16  3:40 PM  Result Value Ref Range Status   Specimen Description URINE, CATHETERIZED  Final   Special Requests NONE  Final   Culture   Final    NO GROWTH Performed at  [facility], [place] [address] [place], [place] [address]    Report Status 11/25/2016 FINAL  Final  C difficile quick scan w PCR reflex     Status: Abnormal   Collection Time: 11/26/16  4:55 PM  Result Value Ref Range Status   C Diff antigen POSITIVE (A) NEGATIVE Final   C Diff toxin NEGATIVE NEGATIVE Final   C Diff interpretation Results are indeterminate. See PCR results.  Final  Clostridium Difficile by PCR     Status: Abnormal   Collection Time: 11/26/16  4:55 PM  Result Value Ref Range Status   Toxigenic C Difficile by pcr POSITIVE (A) NEGATIVE Final    Comment: Positive for toxigenic C. difficile with little to no toxin production. Only treat if clinical presentation suggests symptomatic illness.     Labs: Basic Metabolic Panel:  Recent Labs Lab 11/26/16 0633 11/27/16 0656 11/27/16 1451 11/28/16 0611 11/29/16 0629  NA 139 140 140 141 142  K 3.0* 2.9* 2.9* 2.6* 3.5  CL 111 112* 112* 113* 114*  CO2 20* 20* 21* 21* 20*  GLUCOSE 117* 114* 184* 133* 152*  BUN 41* 32* 31* 25* 20  CREATININE 2.11* 1.82* 1.75* 1.65* 1.52*  CALCIUM 8.0* 7.9* 8.0* 8.1* 8.2*  MG  --   --   --  1.3*  --    Liver Function Tests:  Recent Labs Lab 11/22/16 2325 11/24/16 0609 11/26/16 0633  AST 88* 214* 79*  ALT 116* 173* 115*  ALKPHOS 333* 454* 358*  BILITOT 1.2 1.8* 0.7  PROT 6.4* 6.8 6.4*  ALBUMIN 2.7* 2.7* 2.4*   No results for input(s): LIPASE, AMYLASE in the last 168 hours. No results for input(s): AMMONIA in the last 168 hours. CBC:  Recent Labs Lab 11/22/16 2325 11/24/16 1111 11/25/16 0545 11/26/16 0867 11/28/16 0611 11/29/16 0629  WBC 16.0* 8.5 8.2 6.8 8.7 9.4  NEUTROABS 14.2*  --  5.8 4.9 7.0  --   HGB 12.0* 10.6* 10.3* 10.4* 10.6* 10.8*  HCT 35.8* 31.4* 30.2* 30.5* 31.0* 31.6*  MCV 98.9 98.4 99.0 98.1 97.5 96.9  PLT 268 210 212 203 219 207   Cardiac Enzymes:  Recent Labs Lab 11/23/16 0922 11/26/16 0633  CKTOTAL 20* 14*   BNP: BNP (last 3 results) No  results for input(s): BNP in the last 8760 hours.  ProBNP (last 3 results) No results for input(s): PROBNP in the last 8760 hours.  CBG:  Recent Labs Lab 11/28/16 1146 11/28/16 1707 11/28/16 2207 11/29/16 0745 11/29/16 1107  GLUCAP 158* 203* 130* 147* 185*       Signed:  [facility]  Pager: [phone] 11/29/2016, 12:01 PM

## 2016-11-29 NOTE — Progress Notes (Signed)
House supervisor advised that the pharmacy did not have Gerhardt's cream in stock.  Used barrier cream after cleaning and bathing patient.

## 2016-11-29 NOTE — Progress Notes (Signed)
Attempted twice to call discharge report to facility. Receptionist answers and transfers my call, with no answer from staff nurse. Dept [address] Charge nurse Oran Rein RN made aware of attempts of report .

## 2016-11-30 ENCOUNTER — Ambulatory Visit: Payer: Medicare Other | Admitting: Vascular Surgery

## 2016-12-09 ENCOUNTER — Emergency Department (HOSPITAL_COMMUNITY): Payer: Medicare Other

## 2016-12-09 ENCOUNTER — Emergency Department (HOSPITAL_COMMUNITY)
Admission: EM | Admit: 2016-12-09 | Discharge: 2016-12-09 | Disposition: A | Discharge status: Home / Self Care | Payer: Medicare Other | Source: Home / Self Care | Attending: Emergency Medicine | Admitting: Emergency Medicine

## 2016-12-09 ENCOUNTER — Encounter (HOSPITAL_COMMUNITY): Payer: Self-pay | Admitting: Cardiology

## 2016-12-09 DIAGNOSIS — W06XXXA Fall from bed, initial encounter: Secondary | ICD-10-CM

## 2016-12-09 DIAGNOSIS — M86171 Other acute osteomyelitis, right ankle and foot: Secondary | ICD-10-CM

## 2016-12-09 DIAGNOSIS — M25562 Pain in left knee: Secondary | ICD-10-CM | POA: Insufficient documentation

## 2016-12-09 DIAGNOSIS — M25561 Pain in right knee: Secondary | ICD-10-CM

## 2016-12-09 DIAGNOSIS — R51 Headache: Secondary | ICD-10-CM

## 2016-12-09 DIAGNOSIS — N179 Acute kidney failure, unspecified: Secondary | ICD-10-CM | POA: Diagnosis not present

## 2016-12-09 DIAGNOSIS — Y9384 Activity, sleeping: Secondary | ICD-10-CM

## 2016-12-09 DIAGNOSIS — I739 Peripheral vascular disease, unspecified: Secondary | ICD-10-CM

## 2016-12-09 DIAGNOSIS — Z8673 Personal history of transient ischemic attack (TIA), and cerebral infarction without residual deficits: Secondary | ICD-10-CM | POA: Insufficient documentation

## 2016-12-09 DIAGNOSIS — Z87891 Personal history of nicotine dependence: Secondary | ICD-10-CM | POA: Insufficient documentation

## 2016-12-09 DIAGNOSIS — Y92122 Bedroom in nursing home as the place of occurrence of the external cause: Secondary | ICD-10-CM | POA: Insufficient documentation

## 2016-12-09 DIAGNOSIS — E1140 Type 2 diabetes mellitus with diabetic neuropathy, unspecified: Secondary | ICD-10-CM

## 2016-12-09 DIAGNOSIS — Z794 Long term (current) use of insulin: Secondary | ICD-10-CM | POA: Insufficient documentation

## 2016-12-09 DIAGNOSIS — J449 Chronic obstructive pulmonary disease, unspecified: Secondary | ICD-10-CM | POA: Insufficient documentation

## 2016-12-09 DIAGNOSIS — M86172 Other acute osteomyelitis, left ankle and foot: Secondary | ICD-10-CM

## 2016-12-09 DIAGNOSIS — I10 Essential (primary) hypertension: Secondary | ICD-10-CM

## 2016-12-09 DIAGNOSIS — R52 Pain, unspecified: Secondary | ICD-10-CM | POA: Diagnosis not present

## 2016-12-09 DIAGNOSIS — W19XXXA Unspecified fall, initial encounter: Secondary | ICD-10-CM

## 2016-12-09 DIAGNOSIS — Y999 Unspecified external cause status: Secondary | ICD-10-CM | POA: Insufficient documentation

## 2016-12-09 DIAGNOSIS — Z79899 Other long term (current) drug therapy: Secondary | ICD-10-CM

## 2016-12-09 NOTE — Discharge Instructions (Signed)
The CT of the head and neck does not show serious head or neck injury. X-rays of the knee does not show any fracture. There is some mild bruising in this will take time to get better.\   Return without fail for worsening symptoms, including fever, confusion, intractable vomiting, or any other symptoms concerning to you

## 2016-12-09 NOTE — ED Triage Notes (Signed)
Fall today out of bed.  Pt went face first into floor.  Pt was reaching for something.   C/o bilateral knee pain.  Abrasion left lower leg.  Skin tear to bilateral hands   Pt currently being treated for c -diff .

## 2016-12-09 NOTE — ED Provider Notes (Signed)
[facility] [place] [facility] DEPT Provider Note   CSN: [id number] Arrival date & time: 12/09/16  9528     History   Chief Complaint Chief Complaint  Patient presents with  . Fall    HPI Eric Scott is a 66 y.o. male.  The history is provided by the patient.  Fall  This is a new problem. The current episode started less than 1 hour ago. The problem occurs rarely. The problem has been resolved. Associated symptoms include headaches. Pertinent negatives include no chest pain, no abdominal pain and no shortness of breath. Nothing aggravates the symptoms. Nothing relieves the symptoms. He has tried nothing for the symptoms.   Presents from rehab facility. Rolled out of bed this AM onto face  And bilateral knees. No LOC. Mild frontal headache since them. No n/v, focal numbness/weakness, vision or speech changes. No fever or chills, n/v/d. No blood thinners aside from baby ASA. History of CVA, DM, HTN, PVD. Just d/c from hospital 6/28 for osteomyelitis of bilateral feet on IV daptomycin/cefepime via PICC.   Past Medical History:  Diagnosis Date  . Anxiety   . Chronic pain    legs, back; MRI 05/2012 with mild thoracic degenerative changes no spinal stenosis   . COPD (chronic obstructive pulmonary disease) ([place])   . Depression   . Diabetic peripheral neuropathy ([place])    "chronic" (05/27/2012)  . Diastolic CHF ([place])   . Hypercholesteremia   . Hypertension   . Peripheral edema   . PVD (peripheral vascular disease) ([place])   . Spinal stenosis    mild lumbar (MRI 05/2012)-L2-L3 to L4-L5 , mild lumbar foraminal stenosis   . Stroke ([place]) 05/2012   Subacute, lacunar infarcts within the left basal ganglia and posterior limp of the left internal capsule/thalamus; "RUE; both feet weak" (05/27/2012)  . Tremor   . Type II diabetes mellitus [facility])     Patient Active Problem List   Diagnosis Date Noted  . Volume depletion 11/23/2016  . Hypotension   . Liver enzyme elevation   . 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 Arterial insufficiency of lower extremity ([place]) 11/15/2016  . Pressure injury of skin 11/09/2016  . Sepsis ([place]) 11/09/2016  . Osteomyelitis ([place]) 11/09/2016  . Wounds, multiple 11/08/2016  . Chronic venous insufficiency 10/12/2016  . Critical lower limb ischemia 10/12/2016  . Fatty liver 09/10/2016  . History of colonic polyps 09/10/2016  . Gallstone 09/10/2016  . Edema 03/04/2015  . AKI (acute kidney injury) ([place]) 03/04/2015  . Fall 03/04/2015  . Generalized weakness 03/04/2015  . Diabetic polyneuropathy associated with type 2 diabetes mellitus ([place])   . Anxiety   . PVD (peripheral vascular disease) ([place])   . HCAP (healthcare-associated pneumonia) 07/20/2014  . Pneumonia 07/20/2014  . Weight gain 05/02/2014  . Coarse tremors 04/18/2014  . Anxiety state 04/18/2014  . Acute hypoxemic respiratory failure ([place]) 04/07/2014  . Chronic diastolic CHF (congestive heart failure) ([place]) 04/07/2014  . Solitary pulmonary nodule 04/07/2014  . COPD with exacerbation ([place]) 04/02/2014  . Hypoxia 04/02/2014  . CVA (cerebral infarction) 06/01/2012  . Chronic pain (back, legs) 05/30/2012  . Toe laceration, 4th toe 05/30/2012  . C. difficile diarrhea 05/30/2012  . Hypokalemia 05/30/2012  . Acute lacunar stroke ([place]) 05/27/2012  . DM type 2 (diabetes mellitus, type 2) ([place]) 05/27/2012  . Smoker 05/27/2012  . HTN (hypertension) 05/27/2012    Past Surgical History:  Procedure Laterality Date  . ANKLE SURGERY    .  HERNIA REPAIR  01/05/2004   "belly button" (05/27/2012)       Home Medications    Prior to Admission medications   Medication Sig Start Date End Date Taking? Authorizing Provider  albuterol (PROVENTIL) (2.5 MG/3ML) 0.083% nebulizer solution Take 2.5 mg by nebulization every 3 (three) hours as needed for wheezing or shortness of breath.   Yes [provider]  ALPRAZolam Duanne Moron) 1 MG  tablet Take 1 tablet (1 mg total) by mouth 3 (three) times daily. 03/07/15  Yes Samuella Cota, MD  BREO ELLIPTA 100-25 MCG/INH AEPB  09/27/16  Yes [provider]  ceFEPIme 2 g in dextrose 5 % 50 mL Inject 2 g into the vein every 12 (twelve) hours. Monitor labs and make dose adjustments per pharmacy protocol and input. 11/11/16  Yes Samuella Cota, MD  citalopram (CELEXA) 20 MG tablet Take 20 mg by mouth daily.   Yes [provider]  collagenase (SANTYL) ointment Apply topically daily. Apply Santyl to left heel and right outer foot Q day, then cover with moist gauze, then kerlex and tape. Patient taking differently: Apply 1 application topically See admin instructions. Apply Santyl to left heel and right outer foot every day, then cover with moist gauze, then kerlex and tape. 11/11/16  Yes Samuella Cota, MD  DAPTOmycin 800 mg in sodium chloride 0.9 % 100 mL Inject 800 mg into the vein daily. 11/29/16  Yes Isaac Bliss, Rayford Halsted, MD  fish oil-omega-3 fatty acids 1000 MG capsule Take 1 g by mouth 2 (two) times daily.   Yes [provider]  gabapentin (NEURONTIN) 800 MG tablet Take 800 mg by mouth 3 (three) times daily.   Yes [provider]  HUMALOG KWIKPEN 100 UNIT/ML KiwkPen Inject 2-14 Units into the skin 4 (four) times daily -  before meals and at bedtime. Per sliding scale 150-200= 2 units 201-250= 4 units 251-300= 6 units 301-349= 8 units 350-400=10units 401-450=12units 451-500=14units ABOVE 501- CALL MD 10/07/16  Yes [provider]  ipratropium-albuterol (DUONEB) 0.5-2.5 (3) MG/3ML SOLN Inhale 3 mLs into the lungs 3 (three) times daily.  07/09/16  Yes [provider]  l-methylfolate-B6-B12 (METANX) 3-35-2 MG TABS tablet Take 1 tablet by mouth 2 (two) times daily.   Yes [provider]  LANTUS SOLOSTAR 100 UNIT/ML Solostar Pen 24 Units daily at 10 pm.  10/29/16  Yes [provider]  NON FORMULARY Apply 1  application topically 2 (two) times daily. 14 day course starting on 12/07/2016  GREER GOO   Yes [provider]  oxyCODONE (OXY IR/ROXICODONE) 5 MG immediate release tablet Take 1 tablet (5 mg total) by mouth every 8 (eight) hours as needed for severe pain. 11/11/16  Yes Samuella Cota, MD  saccharomyces boulardii (FLORASTOR) 250 MG capsule Take 1 capsule (250 mg total) by mouth 3 (three) times daily. 11/29/16  Yes Isaac Bliss, Rayford Halsted, MD  vancomycin (VANCOCIN) 50 mg/mL oral solution Take 2.5 mLs (125 mg total) by mouth 4 (four) times daily. 11/29/16  Yes Isaac Bliss, Rayford Halsted, MD  Vitamin D, Ergocalciferol, (DRISDOL) 50000 units CAPS capsule Take 50,000 Units by mouth every 30 (thirty) days.   Yes [provider]  white petrolatum (VASELINE) GEL Apply 1 application topically 2 (two) times daily.   Yes [provider]    Family History Family History  Problem Relation Age of Onset  . Family history unknown: Yes    Social History Social History  Substance Use Topics  .  Smoking status: Former Smoker    Packs/day: 0.50    Years: 45.00    Types: Cigarettes    Quit date: 12/28/2014  . Smokeless tobacco: Never Used  . Alcohol use No     Comment: 05/27/2012 "drank gallons and gallons 20 yr ago or so; last drink  at least 10 yr ago"     Allergies   Blueberry flavor; Cucumber extract; Flexeril [cyclobenzaprine hcl]; and Kiwi extract   Review of Systems Review of Systems  Constitutional: Negative for fever.  Respiratory: Negative for shortness of breath.   Cardiovascular: Negative for chest pain.  Gastrointestinal: Negative for abdominal pain.  Musculoskeletal: Negative for back pain and neck pain.  Neurological: Positive for headaches.  Psychiatric/Behavioral: Negative for confusion.  All other systems reviewed and are negative.    Physical Exam Updated Vital Signs BP (!) 150/77 (BP Location: Left Arm)   Pulse 96   Temp 97.9 F (36.6 C)  (Oral)   Resp (!) 21   Ht 6' (1.829 m)   Wt 125.2 kg (276 lb)   SpO2 92%   BMI 37.43 kg/m   Physical Exam Physical Exam  Nursing note and vitals reviewed. Constitutional: Chrnically ill appearing, non-toxic, and in no acute distress Head: Normocephalic and atraumatic.  Mouth/Throat: Oropharynx is clear and dry.  Neck: Normal range of motion. Neck supple.  Cardiovascular: Normal rate and regular rhythm.   Pulmonary/Chest: Effort normal and breath sounds normal.  Abdominal: Soft. There is no tenderness. There is no rebound and no guarding.  Musculoskeletal: Normal range of motion of all 4 extremities. PICC in RUE w/o overlying skin changes.  Neurological: Alert, no facial droop, fluent speech, moves all extremities symmetrically/purposfully Skin: Skin is warm and dry.  Psychiatric: Cooperative   ED Treatments / Results  Labs (all labs ordered are listed, but only abnormal results are displayed) Labs Reviewed - No data to display  EKG  EKG Interpretation None       Radiology Ct Head Wo Contrast  Result Date: 12/09/2016 CLINICAL DATA:  Pain following fall EXAM: CT HEAD WITHOUT CONTRAST CT CERVICAL SPINE WITHOUT CONTRAST TECHNIQUE: Multidetector CT imaging of the head and cervical spine was performed following the standard protocol without intravenous contrast. Multiplanar CT image reconstructions of the cervical spine were also generated. COMPARISON:  Head CT November 08, 2016 FINDINGS: CT HEAD FINDINGS Brain: Mild diffuse atrophy is stable. There is no intracranial mass, hemorrhage, extra-axial fluid collection, or midline shift there is patchy small vessel disease in the centra semiovale bilaterally. There is evidence of a prior infarct involving much of the left external capsule, primarily anteriorly. There is a prior infarct in the lateral left thalamus. There is no new gray-white compartment lesion. No acute infarct is evident. Vascular: No hyperdense vessel. There is calcification  in each carotid siphon region. Skull: Bony calvarium appears intact. Sinuses/Orbits: There is mucosal thickening with retention cyst in the posterior right maxillary antrum. There is opacification at several sites in left ethmoid region with mucosal thickening in multiple ethmoid air cells bilaterally. Other visualized paranasal sinuses are clear. Orbits appear symmetric bilaterally. Other: Mastoid air cells are clear. CT CERVICAL SPINE FINDINGS Alignment: There is no spondylolisthesis. Skull base and vertebrae: The skull base and craniocervical junction regions appear normal. There is no evident fracture. There is erosive change in the odontoid. There are no blastic or lytic bone lesions. Soft tissues and spinal canal: Prevertebral soft tissues and predental space regions are normal. There are no paraspinous lesions. No  cord or canal hematoma evident. Disc levels: There is moderate disc space narrowing at C6-7. There is facet hypertrophy at several levels. No nerve root edema or effacement. No disc extrusion or stenosis. Upper chest: Visualized upper lung regions appear normal. Other: There is calcification in each proximal subclavian artery as well as in each carotid artery. IMPRESSION: CT head: Atrophy with periventricular small vessel disease. 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 No acute infarct. No intracranial mass, hemorrhage, or extra-axial fluid collection. Foci of arteriovascular calcification noted. Air areas of paranasal sinus disease noted. CT cervical spine: No fracture or spondylolisthesis. Erosive change in the odontoid without impending fracture evident. Areas of osteoarthritic change. No disc extrusion or stenosis. Multiple foci of vascular calcification including foci of calcification in each carotid artery. Electronically Signed   By: Lowella Grip III M.D.   On: 12/09/2016 18:13   Ct Cervical Spine Wo Contrast  Result Date: 12/09/2016 CLINICAL  DATA:  Pain following fall EXAM: CT HEAD WITHOUT CONTRAST CT CERVICAL SPINE WITHOUT CONTRAST TECHNIQUE: Multidetector CT imaging of the head and cervical spine was performed following the standard protocol without intravenous contrast. Multiplanar CT image reconstructions of the cervical spine were also generated. COMPARISON:  Head CT November 08, 2016 FINDINGS: CT HEAD FINDINGS Brain: Mild diffuse atrophy is stable. There is no intracranial mass, hemorrhage, extra-axial fluid collection, or midline shift there is patchy small vessel disease in the centra semiovale bilaterally. There is evidence of a prior infarct involving much of the left external capsule, primarily anteriorly. There is a prior infarct in the lateral left thalamus. There is no new gray-white compartment lesion. No acute infarct is evident. Vascular: No hyperdense vessel. There is calcification in each carotid siphon region. Skull: Bony calvarium appears intact. Sinuses/Orbits: There is mucosal thickening with retention cyst in the posterior right maxillary antrum. There is opacification at several sites in left ethmoid region with mucosal thickening in multiple ethmoid air cells bilaterally. Other visualized paranasal sinuses are clear. Orbits appear symmetric bilaterally. Other: Mastoid air cells are clear. CT CERVICAL SPINE FINDINGS Alignment: There is no spondylolisthesis. Skull base and vertebrae: The skull base and craniocervical junction regions appear normal. There is no evident fracture. There is erosive change in the odontoid. There are no blastic or lytic bone lesions. Soft tissues and spinal canal: Prevertebral soft tissues and predental space regions are normal. There are no paraspinous lesions. No cord or canal hematoma evident. Disc levels: There is moderate disc space narrowing at C6-7. There is facet hypertrophy at several levels. No nerve root edema or effacement. No disc extrusion or stenosis. Upper chest: Visualized upper lung  regions appear normal. Other: There is calcification in each proximal subclavian artery as well as in each carotid artery. IMPRESSION: CT head: Atrophy with periventricular small vessel disease. 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 No acute infarct. No intracranial mass, hemorrhage, or extra-axial fluid collection. Foci of arteriovascular calcification noted. Air areas of paranasal sinus disease noted. CT cervical spine: No fracture or spondylolisthesis. Erosive change in the odontoid without impending fracture evident. Areas of osteoarthritic change. No disc extrusion or stenosis. Multiple foci of vascular calcification including foci of calcification in each carotid artery. Electronically Signed   By: Lowella Grip III M.D.   On: 12/09/2016 18:13   Dg Knee Complete 4 Views Left  Result Date: 12/09/2016 CLINICAL DATA:  Fall from bed.  Bilateral knee pain and erythema. EXAM: LEFT KNEE - COMPLETE  4+ VIEW COMPARISON:  01/07/2006 FINDINGS: Articular space narrowing, particularly in the medial compartment. Trace knee effusion.  Minimal marginal spurring along the patella. IMPRESSION: 1. Trace knee effusion. 2. Mild osteoarthritis. Electronically Signed   By: Van Clines M.D.   On: 12/09/2016 17:47   Dg Knee Complete 4 Views Right  Result Date: 12/09/2016 CLINICAL DATA:  Fall from bed, knee pain and erythema. EXAM: RIGHT KNEE - COMPLETE 4+ VIEW COMPARISON:  03/04/2015 FINDINGS: Motion artifact on several images. No fracture or overt knee effusion. Suspected mild prepatellar subcutaneous edema. Medial compartmental articular space narrowing. IMPRESSION: 1. Degenerative chondral thinning in the medial compartment. 2. Prepatellar subcutaneous edema. 3. Mildly reduced sensitivity due to motion artifact. Electronically Signed   By: Van Clines M.D.   On: 12/09/2016 17:50    Procedures Procedures (including critical care time)  Medications Ordered in  ED Medications - No data to display   Initial Impression / Assessment and Plan / ED Course  I have reviewed the triage vital signs and the nursing notes.  Pertinent labs & imaging results that were available during my care of the patient were reviewed by me and considered in my medical decision making (see chart for details).     Presents after rolling out of bed at nursing facility. The patient appears chronically ill, but not acutely ill at this time. His vital signs are stable. No concerns of worsening infection. It is not anticoagulated. CT head and cervical spine are visualized and does not show acute traumatic injuries. X-rays shows mild osteoarthritis and soft tissue bruising but no acute knee injuries bilaterally. We'll discharge back to nursing facility for supportive care.  Final Clinical Impressions(s) / ED Diagnoses   Final diagnoses:  Fall, initial encounter  Acute pain of both knees    New Prescriptions New Prescriptions   No medications on file     Forde Dandy, MD 12/09/16 1901

## 2016-12-10 ENCOUNTER — Inpatient Hospital Stay (HOSPITAL_COMMUNITY)
Admission: EM | Admit: 2016-12-10 | Discharge: 2016-12-24 | DRG: 682 | Disposition: A | Discharge status: Skilled Nursing Facility | Payer: Medicare Other | Attending: Internal Medicine | Admitting: Internal Medicine

## 2016-12-10 ENCOUNTER — Emergency Department (HOSPITAL_COMMUNITY): Payer: Medicare Other

## 2016-12-10 ENCOUNTER — Encounter (HOSPITAL_COMMUNITY): Payer: Self-pay | Admitting: Emergency Medicine

## 2016-12-10 DIAGNOSIS — R000 Tachycardia, unspecified: Secondary | ICD-10-CM | POA: Diagnosis present

## 2016-12-10 DIAGNOSIS — G9340 Encephalopathy, unspecified: Secondary | ICD-10-CM | POA: Diagnosis present

## 2016-12-10 DIAGNOSIS — E870 Hyperosmolality and hypernatremia: Secondary | ICD-10-CM | POA: Diagnosis present

## 2016-12-10 DIAGNOSIS — E1169 Type 2 diabetes mellitus with other specified complication: Secondary | ICD-10-CM | POA: Diagnosis present

## 2016-12-10 DIAGNOSIS — Y848 Other medical procedures as the cause of abnormal reaction of the patient, or of later complication, without mention of misadventure at the time of the procedure: Secondary | ICD-10-CM | POA: Diagnosis present

## 2016-12-10 DIAGNOSIS — M6281 Muscle weakness (generalized): Secondary | ICD-10-CM | POA: Diagnosis not present

## 2016-12-10 DIAGNOSIS — I959 Hypotension, unspecified: Secondary | ICD-10-CM | POA: Diagnosis present

## 2016-12-10 DIAGNOSIS — Z8619 Personal history of other infectious and parasitic diseases: Secondary | ICD-10-CM

## 2016-12-10 DIAGNOSIS — Z79899 Other long term (current) drug therapy: Secondary | ICD-10-CM

## 2016-12-10 DIAGNOSIS — Z9181 History of falling: Secondary | ICD-10-CM | POA: Diagnosis not present

## 2016-12-10 DIAGNOSIS — E1142 Type 2 diabetes mellitus with diabetic polyneuropathy: Secondary | ICD-10-CM | POA: Diagnosis present

## 2016-12-10 DIAGNOSIS — R680 Hypothermia, not associated with low environmental temperature: Secondary | ICD-10-CM | POA: Diagnosis present

## 2016-12-10 DIAGNOSIS — Z87891 Personal history of nicotine dependence: Secondary | ICD-10-CM | POA: Diagnosis not present

## 2016-12-10 DIAGNOSIS — R509 Fever, unspecified: Secondary | ICD-10-CM

## 2016-12-10 DIAGNOSIS — M86671 Other chronic osteomyelitis, right ankle and foot: Secondary | ICD-10-CM | POA: Diagnosis present

## 2016-12-10 DIAGNOSIS — K264 Chronic or unspecified duodenal ulcer with hemorrhage: Secondary | ICD-10-CM | POA: Diagnosis present

## 2016-12-10 DIAGNOSIS — E1122 Type 2 diabetes mellitus with diabetic chronic kidney disease: Secondary | ICD-10-CM

## 2016-12-10 DIAGNOSIS — F419 Anxiety disorder, unspecified: Secondary | ICD-10-CM | POA: Diagnosis present

## 2016-12-10 DIAGNOSIS — R4182 Altered mental status, unspecified: Secondary | ICD-10-CM

## 2016-12-10 DIAGNOSIS — K269 Duodenal ulcer, unspecified as acute or chronic, without hemorrhage or perforation: Secondary | ICD-10-CM | POA: Diagnosis not present

## 2016-12-10 DIAGNOSIS — K210 Gastro-esophageal reflux disease with esophagitis: Secondary | ICD-10-CM | POA: Diagnosis present

## 2016-12-10 DIAGNOSIS — K2210 Ulcer of esophagus without bleeding: Secondary | ICD-10-CM | POA: Diagnosis present

## 2016-12-10 DIAGNOSIS — K2901 Acute gastritis with bleeding: Secondary | ICD-10-CM | POA: Diagnosis not present

## 2016-12-10 DIAGNOSIS — R9431 Abnormal electrocardiogram [ECG] [EKG]: Secondary | ICD-10-CM

## 2016-12-10 DIAGNOSIS — E876 Hypokalemia: Secondary | ICD-10-CM | POA: Diagnosis not present

## 2016-12-10 DIAGNOSIS — Z8673 Personal history of transient ischemic attack (TIA), and cerebral infarction without residual deficits: Secondary | ICD-10-CM | POA: Diagnosis not present

## 2016-12-10 DIAGNOSIS — T82868A Thrombosis of vascular prosthetic devices, implants and grafts, initial encounter: Secondary | ICD-10-CM | POA: Diagnosis present

## 2016-12-10 DIAGNOSIS — E1151 Type 2 diabetes mellitus with diabetic peripheral angiopathy without gangrene: Secondary | ICD-10-CM | POA: Diagnosis present

## 2016-12-10 DIAGNOSIS — Z794 Long term (current) use of insulin: Secondary | ICD-10-CM

## 2016-12-10 DIAGNOSIS — E872 Acidosis: Secondary | ICD-10-CM | POA: Diagnosis present

## 2016-12-10 DIAGNOSIS — A0472 Enterocolitis due to Clostridium difficile, not specified as recurrent: Secondary | ICD-10-CM | POA: Diagnosis present

## 2016-12-10 DIAGNOSIS — R609 Edema, unspecified: Secondary | ICD-10-CM

## 2016-12-10 DIAGNOSIS — I340 Nonrheumatic mitral (valve) insufficiency: Secondary | ICD-10-CM | POA: Diagnosis not present

## 2016-12-10 DIAGNOSIS — Z888 Allergy status to other drugs, medicaments and biological substances status: Secondary | ICD-10-CM

## 2016-12-10 DIAGNOSIS — N179 Acute kidney failure, unspecified: Principal | ICD-10-CM | POA: Diagnosis present

## 2016-12-10 DIAGNOSIS — N185 Chronic kidney disease, stage 5: Secondary | ICD-10-CM | POA: Diagnosis present

## 2016-12-10 DIAGNOSIS — R52 Pain, unspecified: Secondary | ICD-10-CM | POA: Diagnosis present

## 2016-12-10 DIAGNOSIS — Z7982 Long term (current) use of aspirin: Secondary | ICD-10-CM

## 2016-12-10 DIAGNOSIS — N19 Unspecified kidney failure: Secondary | ICD-10-CM | POA: Diagnosis not present

## 2016-12-10 DIAGNOSIS — Y92129 Unspecified place in nursing home as the place of occurrence of the external cause: Secondary | ICD-10-CM | POA: Diagnosis not present

## 2016-12-10 DIAGNOSIS — I10 Essential (primary) hypertension: Secondary | ICD-10-CM | POA: Diagnosis present

## 2016-12-10 DIAGNOSIS — K449 Diaphragmatic hernia without obstruction or gangrene: Secondary | ICD-10-CM | POA: Diagnosis present

## 2016-12-10 DIAGNOSIS — R740 Nonspecific elevation of levels of transaminase and lactic acid dehydrogenase [LDH]: Secondary | ICD-10-CM

## 2016-12-10 DIAGNOSIS — G894 Chronic pain syndrome: Secondary | ICD-10-CM | POA: Diagnosis present

## 2016-12-10 DIAGNOSIS — K921 Melena: Secondary | ICD-10-CM | POA: Diagnosis not present

## 2016-12-10 DIAGNOSIS — I110 Hypertensive heart disease with heart failure: Secondary | ICD-10-CM | POA: Diagnosis present

## 2016-12-10 DIAGNOSIS — Z9889 Other specified postprocedural states: Secondary | ICD-10-CM | POA: Diagnosis not present

## 2016-12-10 DIAGNOSIS — M86672 Other chronic osteomyelitis, left ankle and foot: Secondary | ICD-10-CM | POA: Diagnosis present

## 2016-12-10 DIAGNOSIS — R34 Anuria and oliguria: Secondary | ICD-10-CM | POA: Diagnosis present

## 2016-12-10 DIAGNOSIS — D62 Acute posthemorrhagic anemia: Secondary | ICD-10-CM | POA: Diagnosis present

## 2016-12-10 DIAGNOSIS — J441 Chronic obstructive pulmonary disease with (acute) exacerbation: Secondary | ICD-10-CM | POA: Diagnosis present

## 2016-12-10 DIAGNOSIS — K319 Disease of stomach and duodenum, unspecified: Secondary | ICD-10-CM | POA: Diagnosis present

## 2016-12-10 DIAGNOSIS — M8600 Acute hematogenous osteomyelitis, unspecified site: Secondary | ICD-10-CM | POA: Diagnosis not present

## 2016-12-10 DIAGNOSIS — R7401 Elevation of levels of liver transaminase levels: Secondary | ICD-10-CM

## 2016-12-10 DIAGNOSIS — W06XXXA Fall from bed, initial encounter: Secondary | ICD-10-CM | POA: Diagnosis present

## 2016-12-10 DIAGNOSIS — I5032 Chronic diastolic (congestive) heart failure: Secondary | ICD-10-CM | POA: Diagnosis not present

## 2016-12-10 DIAGNOSIS — K922 Gastrointestinal hemorrhage, unspecified: Secondary | ICD-10-CM | POA: Diagnosis not present

## 2016-12-10 DIAGNOSIS — E869 Volume depletion, unspecified: Secondary | ICD-10-CM | POA: Diagnosis present

## 2016-12-10 DIAGNOSIS — I82621 Acute embolism and thrombosis of deep veins of right upper extremity: Secondary | ICD-10-CM | POA: Diagnosis present

## 2016-12-10 DIAGNOSIS — M869 Osteomyelitis, unspecified: Secondary | ICD-10-CM | POA: Diagnosis present

## 2016-12-10 DIAGNOSIS — K3189 Other diseases of stomach and duodenum: Secondary | ICD-10-CM | POA: Diagnosis not present

## 2016-12-10 DIAGNOSIS — K2960 Other gastritis without bleeding: Secondary | ICD-10-CM | POA: Diagnosis present

## 2016-12-10 DIAGNOSIS — E119 Type 2 diabetes mellitus without complications: Secondary | ICD-10-CM

## 2016-12-10 DIAGNOSIS — G9341 Metabolic encephalopathy: Secondary | ICD-10-CM | POA: Diagnosis not present

## 2016-12-10 DIAGNOSIS — K209 Esophagitis, unspecified: Secondary | ICD-10-CM | POA: Diagnosis not present

## 2016-12-10 DIAGNOSIS — E1140 Type 2 diabetes mellitus with diabetic neuropathy, unspecified: Secondary | ICD-10-CM | POA: Diagnosis not present

## 2016-12-10 DIAGNOSIS — D500 Iron deficiency anemia secondary to blood loss (chronic): Secondary | ICD-10-CM | POA: Diagnosis present

## 2016-12-10 DIAGNOSIS — K8020 Calculus of gallbladder without cholecystitis without obstruction: Secondary | ICD-10-CM | POA: Diagnosis present

## 2016-12-10 DIAGNOSIS — Z792 Long term (current) use of antibiotics: Secondary | ICD-10-CM

## 2016-12-10 LAB — URINALYSIS, ROUTINE W REFLEX MICROSCOPIC
Bilirubin Urine: NEGATIVE
GLUCOSE, UA: NEGATIVE mg/dL
KETONES UR: 5 mg/dL — AB
Nitrite: NEGATIVE
PROTEIN: 100 mg/dL — AB
Specific Gravity, Urine: 1.016 (ref 1.005–1.030)
pH: 5 (ref 5.0–8.0)

## 2016-12-10 LAB — RENAL FUNCTION PANEL
Albumin: 2 g/dL — ABNORMAL LOW (ref 3.5–5.0)
Anion gap: 15 (ref 5–15)
BUN: 90 mg/dL — ABNORMAL HIGH (ref 6–20)
CO2: 12 mmol/L — ABNORMAL LOW (ref 22–32)
Calcium: 7.9 mg/dL — ABNORMAL LOW (ref 8.9–10.3)
Chloride: 112 mmol/L — ABNORMAL HIGH (ref 101–111)
Creatinine, Ser: 8.55 mg/dL — ABNORMAL HIGH (ref 0.61–1.24)
GFR calc Af Amer: 7 mL/min — ABNORMAL LOW
GFR calc non Af Amer: 6 mL/min — ABNORMAL LOW
Glucose, Bld: 192 mg/dL — ABNORMAL HIGH (ref 65–99)
Phosphorus: 8.4 mg/dL — ABNORMAL HIGH (ref 2.5–4.6)
Potassium: 3.8 mmol/L (ref 3.5–5.1)
Sodium: 139 mmol/L (ref 135–145)

## 2016-12-10 LAB — CBC WITH DIFFERENTIAL/PLATELET
Basophils Absolute: 0 10*3/uL (ref 0.0–0.1)
Basophils Relative: 0 %
EOS ABS: 0 10*3/uL (ref 0.0–0.7)
Eosinophils Relative: 0 %
HEMATOCRIT: 35.9 % — AB (ref 39.0–52.0)
HEMOGLOBIN: 12.5 g/dL — AB (ref 13.0–17.0)
LYMPHS ABS: 0.8 10*3/uL (ref 0.7–4.0)
LYMPHS PCT: 4 %
MCH: 33 pg (ref 26.0–34.0)
MCHC: 34.8 g/dL (ref 30.0–36.0)
MCV: 94.7 fL (ref 78.0–100.0)
MONOS PCT: 4 %
Monocytes Absolute: 0.7 10*3/uL (ref 0.1–1.0)
NEUTROS PCT: 92 %
Neutro Abs: 16.5 10*3/uL — ABNORMAL HIGH (ref 1.7–7.7)
Platelets: 308 10*3/uL (ref 150–400)
RBC: 3.79 MIL/uL — AB (ref 4.22–5.81)
RDW: 15.4 % (ref 11.5–15.5)
WBC: 18 10*3/uL — ABNORMAL HIGH (ref 4.0–10.5)

## 2016-12-10 LAB — PROTIME-INR
INR: 1.5
Prothrombin Time: 18.2 seconds — ABNORMAL HIGH (ref 11.4–15.2)

## 2016-12-10 LAB — COMPREHENSIVE METABOLIC PANEL
ALBUMIN: 2.2 g/dL — AB (ref 3.5–5.0)
ALK PHOS: 102 U/L (ref 38–126)
ALT: 226 U/L — ABNORMAL HIGH (ref 17–63)
ANION GAP: 15 (ref 5–15)
AST: 243 U/L — ABNORMAL HIGH (ref 15–41)
BUN: 87 mg/dL — ABNORMAL HIGH (ref 6–20)
CALCIUM: 8 mg/dL — AB (ref 8.9–10.3)
CHLORIDE: 110 mmol/L (ref 101–111)
CO2: 12 mmol/L — AB (ref 22–32)
Creatinine, Ser: 8.37 mg/dL — ABNORMAL HIGH (ref 0.61–1.24)
GFR calc Af Amer: 7 mL/min — ABNORMAL LOW (ref >60)
GFR calc non Af Amer: 6 mL/min — ABNORMAL LOW (ref >60)
Glucose, Bld: 231 mg/dL — ABNORMAL HIGH (ref 65–99)
Potassium: 3.8 mmol/L (ref 3.5–5.1)
SODIUM: 137 mmol/L (ref 135–145)
Total Bilirubin: 0.6 mg/dL (ref 0.3–1.2)
Total Protein: 6.8 g/dL (ref 6.5–8.1)

## 2016-12-10 LAB — I-STAT CG4 LACTIC ACID, ED
LACTIC ACID, VENOUS: 0.8 mmol/L (ref 0.5–1.9)
Lactic Acid, Venous: 1.56 mmol/L (ref 0.5–1.9)

## 2016-12-10 LAB — GLUCOSE, CAPILLARY: Glucose-Capillary: 171 mg/dL — ABNORMAL HIGH (ref 65–99)

## 2016-12-10 LAB — TROPONIN I: TROPONIN I: 0.04 ng/mL — AB (ref <0.03)

## 2016-12-10 LAB — AMMONIA: Ammonia: 25 umol/L (ref 9–35)

## 2016-12-10 MED ORDER — HEPARIN SODIUM (PORCINE) 1000 UNIT/ML DIALYSIS
1000.0000 [IU] | INTRAMUSCULAR | Status: DC | PRN
  Administered 2016-12-13: 1000 [IU] via INTRAVENOUS_CENTRAL
  Filled 2016-12-10 (×2): qty 1

## 2016-12-10 MED ORDER — HEPARIN SODIUM (PORCINE) 1000 UNIT/ML IJ SOLN
INTRAMUSCULAR | Status: AC
  Filled 2016-12-10: qty 6

## 2016-12-10 MED ORDER — SODIUM CHLORIDE 0.9 % IV SOLN: 100.0000 mL | INTRAVENOUS | Status: DC | PRN

## 2016-12-10 MED ORDER — SODIUM CHLORIDE 0.9 % IV BOLUS (SEPSIS)
500.0000 mL | Freq: Once | INTRAVENOUS | Status: AC
  Administered 2016-12-10: 500 mL via INTRAVENOUS

## 2016-12-10 MED ORDER — INSULIN ASPART 100 UNIT/ML ~~LOC~~ SOLN
0.0000 [IU] | SUBCUTANEOUS | Status: DC
  Administered 2016-12-11 (×2): 3 [IU] via SUBCUTANEOUS

## 2016-12-10 MED ORDER — LIDOCAINE-PRILOCAINE 2.5-2.5 % EX CREA
1.0000 "application " | TOPICAL_CREAM | CUTANEOUS | Status: DC | PRN
  Filled 2016-12-10: qty 5

## 2016-12-10 MED ORDER — INSULIN ASPART 100 UNIT/ML ~~LOC~~ SOLN: 0.0000 [IU] | Freq: Three times a day (TID) | SUBCUTANEOUS | Status: DC

## 2016-12-10 MED ORDER — ALTEPLASE 2 MG IJ SOLR
2.0000 mg | Freq: Once | INTRAMUSCULAR | Status: AC | PRN
  Administered 2016-12-11: 4 mg
  Filled 2016-12-10: qty 2

## 2016-12-10 MED ORDER — LIDOCAINE HCL (PF) 1 % IJ SOLN: 5.0000 mL | INTRAMUSCULAR | Status: DC | PRN

## 2016-12-10 MED ORDER — PENTAFLUOROPROP-TETRAFLUOROETH EX AERO
1.0000 "application " | INHALATION_SPRAY | CUTANEOUS | Status: DC | PRN
  Filled 2016-12-10: qty 30

## 2016-12-10 MED ORDER — SODIUM CHLORIDE 0.9 % IV SOLN
INTRAVENOUS | Status: DC
  Administered 2016-12-10 – 2016-12-11 (×2): via INTRAVENOUS

## 2016-12-10 MED ORDER — MUPIROCIN 2 % EX OINT
1.0000 "application " | TOPICAL_OINTMENT | Freq: Two times a day (BID) | CUTANEOUS | Status: AC
  Administered 2016-12-10 – 2016-12-15 (×10): 1 via NASAL
  Filled 2016-12-10 (×2): qty 22

## 2016-12-10 MED ORDER — CHLORHEXIDINE GLUCONATE CLOTH 2 % EX PADS
6.0000 | MEDICATED_PAD | Freq: Every day | CUTANEOUS | Status: DC
  Administered 2016-12-11 – 2016-12-12 (×2): 6 via TOPICAL

## 2016-12-10 MED ORDER — INSULIN ASPART 100 UNIT/ML ~~LOC~~ SOLN: 0.0000 [IU] | Freq: Every day | SUBCUTANEOUS | Status: DC

## 2016-12-10 NOTE — ED Provider Notes (Signed)
[facility] DEPT Provider Note   CSN: [id number] Arrival date & time: 12/10/16  1033     History   Chief Complaint Chief Complaint  Patient presents with  . Weakness    HPI Eric Scott is a 66 y.o. male.  The history is provided by the patient, the nursing home and the EMS personnel. The history is limited by the condition of the patient (nods head to questions).  Weakness   Pt was seen at 1055.  Per EMS and NH report:  Pt seen in ED yesterday s/p fall from bed onto face with CT/XR reassuring, and pt was d/c back to facility. NH states pt has "altered LOC" and "confused" today.  No reported fevers, no vomiting/diarrhea, no new falls. Pt will nod head yes/no to questions: denies CP/SOB/abd pain. Pt is currently receiving IV abx for osteomyelitis of his foot and PO abx for cdiff.     Past Medical History:  Diagnosis Date  . Anxiety   . Chronic pain    legs, back; MRI 05/2012 with mild thoracic degenerative changes no spinal stenosis   . COPD (chronic obstructive pulmonary disease) ([place])   . Depression   . Diabetic peripheral neuropathy ([place])    "chronic" (05/27/2012)  . Diastolic CHF ([place])   . Hypercholesteremia   . Hypertension   . Peripheral edema   . PVD (peripheral vascular disease) ([place])   . Spinal stenosis    mild lumbar (MRI 05/2012)-L2-L3 to L4-L5 , mild lumbar foraminal stenosis   . Stroke ([place]) 05/2012   Subacute, lacunar infarcts within the left basal ganglia and posterior limp of the left internal capsule/thalamus; "RUE; both feet weak" (05/27/2012)  . Tremor   . Type II diabetes mellitus [facility])     Patient Active Problem List   Diagnosis Date Noted  . Volume depletion 11/23/2016  . Hypotension   . Liver enzyme elevation   . 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 Arterial insufficiency of lower extremity ([place]) 11/15/2016  . Pressure injury of skin 11/09/2016  . Sepsis ([place])  11/09/2016  . Osteomyelitis ([place]) 11/09/2016  . Wounds, multiple 11/08/2016  . Chronic venous insufficiency 10/12/2016  . Critical lower limb ischemia 10/12/2016  . Fatty liver 09/10/2016  . History of colonic polyps 09/10/2016  . Gallstone 09/10/2016  . Edema 03/04/2015  . AKI (acute kidney injury) ([place]) 03/04/2015  . Fall 03/04/2015  . Generalized weakness 03/04/2015  . Diabetic polyneuropathy associated with type 2 diabetes mellitus ([place])   . Anxiety   . PVD (peripheral vascular disease) ([place])   . HCAP (healthcare-associated pneumonia) 07/20/2014  . Pneumonia 07/20/2014  . Weight gain 05/02/2014  . Coarse tremors 04/18/2014  . Anxiety state 04/18/2014  . Acute hypoxemic respiratory failure ([place]) 04/07/2014  . Chronic diastolic CHF (congestive heart failure) ([place]) 04/07/2014  . Solitary pulmonary nodule 04/07/2014  . COPD with exacerbation ([place]) 04/02/2014  . Hypoxia 04/02/2014  . CVA (cerebral infarction) 06/01/2012  . Chronic pain (back, legs) 05/30/2012  . Toe laceration, 4th toe 05/30/2012  . C. difficile diarrhea 05/30/2012  . Hypokalemia 05/30/2012  . Acute lacunar stroke ([place]) 05/27/2012  . DM type 2 (diabetes mellitus, type 2) ([place]) 05/27/2012  . Smoker 05/27/2012  . HTN (hypertension) 05/27/2012    Past Surgical History:  Procedure Laterality Date  . ANKLE SURGERY    . HERNIA REPAIR  01/05/2004   "belly button" (05/27/2012)       Home Medications  Prior to Admission medications   Medication Sig Start Date End Date Taking? Authorizing Provider  albuterol (PROVENTIL) (2.5 MG/3ML) 0.083% nebulizer solution Take 2.5 mg by nebulization every 3 (three) hours as needed for wheezing or shortness of breath.    [provider]  ALPRAZolam Duanne Moron) 1 MG tablet Take 1 tablet (1 mg total) by mouth 3 (three) times daily. 03/07/15   Samuella Cota, MD  BREO ELLIPTA 100-25 MCG/INH AEPB  09/27/16   [provider]  ceFEPIme 2 g in dextrose 5 % 50 mL  Inject 2 g into the vein every 12 (twelve) hours. Monitor labs and make dose adjustments per pharmacy protocol and input. 11/11/16   Samuella Cota, MD  citalopram (CELEXA) 20 MG tablet Take 20 mg by mouth daily.    [provider]  collagenase (SANTYL) ointment Apply topically daily. Apply Santyl to left heel and right outer foot Q day, then cover with moist gauze, then kerlex and tape. Patient taking differently: Apply 1 application topically See admin instructions. Apply Santyl to left heel and right outer foot every day, then cover with moist gauze, then kerlex and tape. 11/11/16   Samuella Cota, MD  DAPTOmycin 800 mg in sodium chloride 0.9 % 100 mL Inject 800 mg into the vein daily. 11/29/16   Isaac Bliss, Rayford Halsted, MD  fish oil-omega-3 fatty acids 1000 MG capsule Take 1 g by mouth 2 (two) times daily.    [provider]  gabapentin (NEURONTIN) 800 MG tablet Take 800 mg by mouth 3 (three) times daily.    [provider]  HUMALOG KWIKPEN 100 UNIT/ML KiwkPen Inject 2-14 Units into the skin 4 (four) times daily -  before meals and at bedtime. Per sliding scale 150-200= 2 units 201-250= 4 units 251-300= 6 units 301-349= 8 units 350-400=10units 401-450=12units 451-500=14units ABOVE 501- CALL MD 10/07/16   [provider]  ipratropium-albuterol (DUONEB) 0.5-2.5 (3) MG/3ML SOLN Inhale 3 mLs into the lungs 3 (three) times daily.  07/09/16   [provider]  l-methylfolate-B6-B12 (METANX) 3-35-2 MG TABS tablet Take 1 tablet by mouth 2 (two) times daily.    [provider]  LANTUS SOLOSTAR 100 UNIT/ML Solostar Pen 24 Units daily at 10 pm.  10/29/16   [provider]  NON FORMULARY Apply 1 application topically 2 (two) times daily. 14 day course starting on 12/07/2016  [facility] GOO    [provider]  oxyCODONE (OXY IR/ROXICODONE) 5 MG immediate release tablet Take 1 tablet (5 mg total) by mouth every 8 (eight) hours as needed for  severe pain. 11/11/16   Samuella Cota, MD  saccharomyces boulardii (FLORASTOR) 250 MG capsule Take 1 capsule (250 mg total) by mouth 3 (three) times daily. 11/29/16   Isaac Bliss, Rayford Halsted, MD  vancomycin (VANCOCIN) 50 mg/mL oral solution Take 2.5 mLs (125 mg total) by mouth 4 (four) times daily. 11/29/16   Isaac Bliss, Rayford Halsted, MD  Vitamin D, Ergocalciferol, (DRISDOL) 50000 units CAPS capsule Take 50,000 Units by mouth every 30 (thirty) days.    [provider]  white petrolatum (VASELINE) GEL Apply 1 application topically 2 (two) times daily.    [provider]    Family History Family History  Problem Relation Age of Onset  . Family history unknown: Yes    Social History Social History  Substance Use Topics  . Smoking status: Former Smoker    Packs/day: 0.50    Years: 45.00    Types: Cigarettes  Quit date: 12/28/2014  . Smokeless tobacco: Never Used  . Alcohol use No     Comment: 05/27/2012 "drank gallons and gallons 20 yr ago or so; last drink  at least 10 yr ago"     Allergies   Blueberry flavor; Cucumber extract; Flexeril [cyclobenzaprine hcl]; and Kiwi extract   Review of Systems Review of Systems  Unable to perform ROS: Mental status change  Neurological: Positive for weakness.     Physical Exam Updated Vital Signs BP 133/90 (BP Location: Left Arm)   Pulse (!) 109   Temp (!) 97.5 F (36.4 C) (Oral)   Resp 18   SpO2 95%    Patient Vitals for the past 24 hrs:  BP Temp Temp src Pulse Resp SpO2  12/10/16 1400 (!) 159/76 - - (!) 105 16 96 %  12/10/16 1330 (!) 152/71 - - (!) 105 16 97 %  12/10/16 1230 112/65 - - (!) 106 18 96 %  12/10/16 1200 127/72 - - (!) 103 19 97 %  12/10/16 1139 - (!) 96.8 F (36 C) Rectal - - -  12/10/16 1130 129/69 - - (!) 107 17 97 %  12/10/16 1100 119/74 - - (!) 111 17 98 %  12/10/16 1041 133/90 (!) 97.5 F (36.4 C) Oral (!) 109 18 95 %     Physical Exam 1100: Physical examination:  Nursing  notes reviewed; Vital signs and O2 SAT reviewed;  Constitutional: Well developed, Well nourished, In no acute distress; Head:  Normocephalic, atraumatic; Eyes: EOMI, PERRL, No scleral icterus; ENMT: Mouth and pharynx normal, Mucous membranes dry; Neck: Supple, Full range of motion, No lymphadenopathy; Cardiovascular: Tachycardic rate and rhythm, No gallop; Respiratory: Breath sounds clear & equal bilaterally, No wheezes. Normal respiratory effort/excursion; Chest: Nontender, Movement normal; Abdomen: Soft, Nontender, Mildly distended, Normal bowel sounds;; Extremities: Pulses normal, +PICC right upper arm without erythema. No deformity.  No edema.; Neuro: Awake, alert. Eyes open spontaneously. Nods head yes/no to questions. Moves all extremities on stretcher spontaneously.; Skin: Color normal, Warm, Dry.   ED Treatments / Results  Labs (all labs ordered are listed, but only abnormal results are displayed)   EKG  EKG Interpretation  Date/Time:  Monday December 10 2016 11:25:37 EDT Ventricular Rate:  106 PR Interval:    QRS Duration: 99 QT Interval:  357 QTC Calculation: 475 R Axis:   -27 Text Interpretation:  Sinus tachycardia Borderline left axis deviation Nonspecific ST and T wave abnormality When compared with ECG of 07/20/2014 Rate slower Otherwise no significant change Confirmed by [facility]  MD, Nunzio Cory [phone]) on 12/10/2016 11:44:19 AM       Radiology   Procedures Procedures (including critical care time)  Medications Ordered in ED Medications - No data to display   Initial Impression / Assessment and Plan / ED Course  I have reviewed the triage vital signs and the nursing notes.  Pertinent labs & imaging results that were available during my care of the patient were reviewed by me and considered in my medical decision making (see chart for details).  MDM Reviewed: previous chart, nursing note and vitals Reviewed previous: labs, ECG and CT scan Interpretation: labs, ECG, x-ray  and CT scan   Results for orders placed or performed during the hospital encounter of 12/10/16  Culture, blood (Routine x 2)  Result Value Ref Range   Specimen Description BLOOD RIGHT ARM    Special Requests      BOTTLES DRAWN AEROBIC AND ANAEROBIC Blood Culture adequate volume  Culture PENDING    Report Status PENDING   Comprehensive metabolic panel  Result Value Ref Range   Sodium 137 135 - 145 mmol/L   Potassium 3.8 3.5 - 5.1 mmol/L   Chloride 110 101 - 111 mmol/L   CO2 12 (L) 22 - 32 mmol/L   Glucose, Bld 231 (H) 65 - 99 mg/dL   BUN 87 (H) 6 - 20 mg/dL   Creatinine, Ser 8.37 (H) 0.61 - 1.24 mg/dL   Calcium 8.0 (L) 8.9 - 10.3 mg/dL   Total Protein 6.8 6.5 - 8.1 g/dL   Albumin 2.2 (L) 3.5 - 5.0 g/dL   AST 243 (H) 15 - 41 U/L   ALT 226 (H) 17 - 63 U/L   Alkaline Phosphatase 102 38 - 126 U/L   Total Bilirubin 0.6 0.3 - 1.2 mg/dL   GFR calc non Af Amer 6 (L) >60 mL/min   GFR calc Af Amer 7 (L) >60 mL/min   Anion gap 15 5 - 15  CBC with Differential  Result Value Ref Range   WBC 18.0 (H) 4.0 - 10.5 K/uL   RBC 3.79 (L) 4.22 - 5.81 MIL/uL   Hemoglobin 12.5 (L) 13.0 - 17.0 g/dL   HCT 35.9 (L) 39.0 - 52.0 %   MCV 94.7 78.0 - 100.0 fL   MCH 33.0 26.0 - 34.0 pg   MCHC 34.8 30.0 - 36.0 g/dL   RDW 15.4 11.5 - 15.5 %   Platelets 308 150 - 400 K/uL   Neutrophils Relative % 92 %   Neutro Abs 16.5 (H) 1.7 - 7.7 K/uL   Lymphocytes Relative 4 %   Lymphs Abs 0.8 0.7 - 4.0 K/uL   Monocytes Relative 4 %   Monocytes Absolute 0.7 0.1 - 1.0 K/uL   Eosinophils Relative 0 %   Eosinophils Absolute 0.0 0.0 - 0.7 K/uL   Basophils Relative 0 %   Basophils Absolute 0.0 0.0 - 0.1 K/uL  Protime-INR  Result Value Ref Range   Prothrombin Time 18.2 (H) 11.4 - 15.2 seconds   INR 1.50   Urinalysis, Routine w reflex microscopic  Result Value Ref Range   Color, Urine YELLOW YELLOW   APPearance HAZY (A) CLEAR   Specific Gravity, Urine 1.016 1.005 - 1.030   pH 5.0 5.0 - 8.0   Glucose, UA  NEGATIVE NEGATIVE mg/dL   Hgb urine dipstick LARGE (A) NEGATIVE   Bilirubin Urine NEGATIVE NEGATIVE   Ketones, ur 5 (A) NEGATIVE mg/dL   Protein, ur 100 (A) NEGATIVE mg/dL   Nitrite NEGATIVE NEGATIVE   Leukocytes, UA TRACE (A) NEGATIVE   RBC / HPF 0-5 0 - 5 RBC/hpf   WBC, UA 6-30 0 - 5 WBC/hpf   Bacteria, UA RARE (A) NONE SEEN   Squamous Epithelial / LPF 0-5 (A) NONE SEEN   Mucous PRESENT    Budding Yeast PRESENT   Troponin I  Result Value Ref Range   Troponin I 0.04 (HH) <0.03 ng/mL  Ammonia  Result Value Ref Range   Ammonia 25 9 - 35 umol/L  I-Stat CG4 Lactic Acid, ED  Result Value Ref Range   Lactic Acid, Venous 1.56 0.5 - 1.9 mmol/L   Ct Head Wo Contrast Result Date: 12/10/2016 CLINICAL DATA:  Fall yesterday.  Lethargic EXAM: CT HEAD WITHOUT CONTRAST TECHNIQUE: Contiguous axial images were obtained from the base of the skull through the vertex without intravenous contrast. COMPARISON:  CT 12/09/2016 FINDINGS: Brain: Mild atrophy unchanged. Negative for hydrocephalus. Chronic ischemic changes in  the white matter including the left internal and external capsule. Negative for acute infarct, hemorrhage, or mass lesion. No fluid collection Vascular: Negative for hyperdense vessel Skull: Negative Sinuses/Orbits: Mild mucosal edema right maxillary sinus and left ethmoid sinus. Negative orbit. Other: None IMPRESSION: No acute intracranial abnormality.  Stable since the recent study. Electronically Signed   By: Franchot Gallo M.D.   On: 12/10/2016 13:31   US Abdomen Complete Result Date: 12/10/2016 CLINICAL DATA:  66 y/o M; increased liver function tests, gallstones, altered mental status. EXAM: ABDOMEN ULTRASOUND COMPLETE COMPARISON:  11/24/2016 abdominal ultrasound. 11/23/2016 CT of the abdomen and pelvis. FINDINGS: Gallbladder: No gallbladder wall thickening or pericholecystic fluid and negative sonographic Murphy's sign. Gallstones measuring up to 2.7 cm. Some additional stones and sludge  adherent to the wall of the gallbladder. Common bile duct: Diameter: 6 mm Liver: No focal lesion identified. Mildly increased echogenicity of the liver parenchyma. Normal direction of flow in main portal vein. IVC: No abnormality visualized. Pancreas: Obscured by bowel gas. Spleen: Size and appearance within normal limits. Right Kidney: Length: 11.2 cm. Increased renal echogenicity. No hydronephrosis or mass identified. Left Kidney: Length: 13.6 cm. Increase renal echogenicity. Simple appearing cyst measuring up to 2.3 cm within the upper pole. Abdominal aorta: Largely obscured by bowel gas. Other findings: None. IMPRESSION: 1. Gallstones and sludge, some adherent to the wall of gallbladder. No secondary signs of acute cholecystitis. 2. Mildly increased liver echogenicity, possible steatosis. 3. Increased renal echogenicity compatible with medical renal disease. Electronically Signed   By: Kristine Garbe M.D.   On: 12/10/2016 14:01   Dg Abd Acute W/chest Result Date: 12/10/2016 CLINICAL DATA:  66 year old male with Celsius difficile. Subsequent encounter. EXAM: DG ABDOMEN ACUTE W/ 1V CHEST COMPARISON:  11/23/2016 CT abdomen pelvis and chest x-ray. FINDINGS: Cardiomegaly. Pulmonary vascular prominence. PICC line tip caval distal superior vena cava/ atrial junction level. Calcified aorta with slight tortuosity. Gas-filled stomach. No plain film evidence of bowel obstruction or free intraperitoneal air. IMPRESSION: No plain film evidence of bowel obstruction or free intraperitoneal air. Cardiomegaly. Pulmonary vascular prominence. Calcified slightly tortuous aorta. Electronically Signed   By: Genia Del M.D.   On: 12/10/2016 11:32   Results for IHAN, PAT (MRN [id number]) as of 12/10/2016 14:36  Ref. Range 11/27/2016 06:56 11/27/2016 14:51 11/28/2016 06:11 11/29/2016 06:29 12/10/2016 11:50  BUN Latest Ref Range: 6 - 20 mg/dL 32 (H) 31 (H) 25 (H) 20 87 (H)  Creatinine Latest Ref Range: 0.61 - 1.24 mg/dL  1.82 (H) 1.75 (H) 1.65 (H) 1.52 (H) 8.37 (H)    1420:  Pt hypothermic; BairHugger placed and BC/US obtained. Judicious IVF given for tachycardia and clinical dehydration. New ARF on labs. LFT's elevated but [place] without acute cholecystitis. BP has remained stable.  T/C to [facility]. Marin Comment, case discussed, including:  HPI, pertinent PM/SHx, VS/PE, dx testing, ED course and treatment:  Agreeable to admit, requests to call Nephrology MD to consult. T/C to Renal Dr. Lowanda Foster, case discussed, including:  HPI, pertinent PM/SHx, VS/PE, dx testing, ED course and treatment:  Agreeable to consult.     Final Clinical Impressions(s) / ED Diagnoses   Final diagnoses:  Pain    New Prescriptions New Prescriptions   No medications on file      Francine Graven, DO 12/14/16 1520

## 2016-12-10 NOTE — Procedures (Signed)
Eric Scott is a 66 y.o. male patient. 1. Acute renal failure, unspecified acute renal failure type ([place])   2. Pain   3. Fever   4. Transaminitis   5. Altered mental status, unspecified altered mental status type    Past Medical History:  Diagnosis Date  . Anxiety   . Chronic pain    legs, back; MRI 05/2012 with mild thoracic degenerative changes no spinal stenosis   . COPD (chronic obstructive pulmonary disease) ([place])   . Depression   . Diabetic peripheral neuropathy ([place])    "chronic" (05/27/2012)  . Diastolic CHF ([place])   . Hypercholesteremia   . Hypertension   . Peripheral edema   . PVD (peripheral vascular disease) ([place])   . Spinal stenosis    mild lumbar (MRI 05/2012)-L2-L3 to L4-L5 , mild lumbar foraminal stenosis   . Stroke ([place]) 05/2012   Subacute, lacunar infarcts within the left basal ganglia and posterior limp of the left internal capsule/thalamus; "RUE; both feet weak" (05/27/2012)  . Tremor   . Type II diabetes mellitus ([name])    Blood pressure (!) 144/79, pulse (!) 114, temperature (!) 96.8 F (36 C), temperature source Rectal, resp. rate 17, SpO2 95 %.  Procedures Dialysis catheter insertion Informed consent could not be obtained as the patient is uremic. He needs emergent dialysis. The right groin was prepped and draped using usual sterile technique with Betadine. Gown, gloves, mask, and sterile field were all used. One percent Xylocaine was used for local anesthesia. The needles advanced into the right femoral vein using the Seldinger technique without difficulty. The guidewire was then passed without difficulty. An dilator was placed over the guidewire. The dual-lumen dialysis catheter was then inserted over the guidewire. The guidewire was removed. Good blood flow was aspirated from both ports. Both ports were flushed with saline. The catheter was secured in place using a 3-0 silk suture. Betadine ointment and dry sterile dressing were applied. Patient tolerated  the procedure well. [place]iva Signs 12/10/2016

## 2016-12-10 NOTE — Consult Note (Signed)
Reason for Consult: Acute kidney injury and altered mental status Referring Physician: Dr. Marya Fossa Orrison is an 66 y.o. male.  HPI: He is a patient who has history of CVA, diabetes, hypertension, recently discharged after being admitted for osteomyelitis of his feet. During that time patient developed acute renal failure with creatinine going About 3.8. However before his discharge creatinine came down to 1.8. Presently patient was sent from nursing home after a fall down. When he was evaluated and was found to be very confused with very high BUN and creatinine. Presently patient is not able to answer any questions.  Past Medical History:  Diagnosis Date  . Anxiety   . Chronic pain    legs, back; MRI 05/2012 with mild thoracic degenerative changes no spinal stenosis   . COPD (chronic obstructive pulmonary disease) ([place])   . Depression   . Diabetic peripheral neuropathy ([place])    "chronic" (05/27/2012)  . Diastolic CHF ([place])   . Hypercholesteremia   . Hypertension   . Peripheral edema   . PVD (peripheral vascular disease) ([place])   . Spinal stenosis    mild lumbar (MRI 05/2012)-L2-L3 to L4-L5 , mild lumbar foraminal stenosis   . Stroke ([place]) 05/2012   Subacute, lacunar infarcts within the left basal ganglia and posterior limp of the left internal capsule/thalamus; "RUE; both feet weak" (05/27/2012)  . Tremor   . Type II diabetes mellitus ([place])     Past Surgical History:  Procedure Laterality Date  . ANKLE SURGERY    . HERNIA REPAIR  01/05/2004   "belly button" (05/27/2012)    Family History  Problem Relation Age of Onset  . Family history unknown: Yes    Social History:  reports that he quit smoking about 1 years ago. His smoking use included Cigarettes. He has a 22.50 pack-year smoking history. He has never used smokeless tobacco. He reports that he does not drink alcohol or use drugs.  Allergies:  Allergies  Allergen Reactions  . Blueberry Flavor   . Cucumber Extract  Other (See Comments)    "Fells like I'm having a heart attack"  . Flexeril [Cyclobenzaprine Hcl] Other (See Comments)    "whole body  Tremors" (05/27/2012)  . Kiwi Extract Other (See Comments)    "feels like I'm having a heart attack"    Medications: I have reviewed the patient's current medications.  Results for orders placed or performed during the hospital encounter of 12/10/16 (from the past 48 hour(s))  Urinalysis, Routine w reflex microscopic     Status: Abnormal   Collection Time: 12/10/16 10:41 AM  Result Value Ref Range   Color, Urine YELLOW YELLOW   APPearance HAZY (A) CLEAR   Specific Gravity, Urine 1.016 1.005 - 1.030   pH 5.0 5.0 - 8.0   Glucose, UA NEGATIVE NEGATIVE mg/dL   Hgb urine dipstick LARGE (A) NEGATIVE   Bilirubin Urine NEGATIVE NEGATIVE   Ketones, ur 5 (A) NEGATIVE mg/dL   Protein, ur 100 (A) NEGATIVE mg/dL   Nitrite NEGATIVE NEGATIVE   Leukocytes, UA TRACE (A) NEGATIVE   RBC / HPF 0-5 0 - 5 RBC/hpf   WBC, UA 6-30 0 - 5 WBC/hpf   Bacteria, UA RARE (A) NONE SEEN   Squamous Epithelial / LPF 0-5 (A) NONE SEEN   Mucous PRESENT    Budding Yeast PRESENT   Troponin I     Status: Abnormal   Collection Time: 12/10/16 10:56 AM  Result Value Ref Range   Troponin I  0.04 (HH) <0.03 ng/mL    Comment: CRITICAL RESULT CALLED TO, READ BACK BY AND VERIFIED WITH: WILEY,E @ 1245 ON 7.9.18 BY BAYSE,L   Comprehensive metabolic panel     Status: Abnormal   Collection Time: 12/10/16 11:50 AM  Result Value Ref Range   Sodium 137 135 - 145 mmol/L   Potassium 3.8 3.5 - 5.1 mmol/L   Chloride 110 101 - 111 mmol/L   CO2 12 (L) 22 - 32 mmol/L   Glucose, Bld 231 (H) 65 - 99 mg/dL   BUN 87 (H) 6 - 20 mg/dL   Creatinine, Ser 8.37 (H) 0.61 - 1.24 mg/dL   Calcium 8.0 (L) 8.9 - 10.3 mg/dL   Total Protein 6.8 6.5 - 8.1 g/dL   Albumin 2.2 (L) 3.5 - 5.0 g/dL   AST 243 (H) 15 - 41 U/L   ALT 226 (H) 17 - 63 U/L   Alkaline Phosphatase 102 38 - 126 U/L   Total Bilirubin 0.6 0.3 -  1.2 mg/dL   GFR calc non Af Amer 6 (L) >60 mL/min   GFR calc Af Amer 7 (L) >60 mL/min    Comment: (NOTE) The eGFR has been calculated using the CKD EPI equation. This calculation has not been validated in all clinical situations. eGFR's persistently <60 mL/min signify possible Chronic Kidney Disease.    Anion gap 15 5 - 15  CBC with Differential     Status: Abnormal   Collection Time: 12/10/16 11:50 AM  Result Value Ref Range   WBC 18.0 (H) 4.0 - 10.5 K/uL   RBC 3.79 (L) 4.22 - 5.81 MIL/uL   Hemoglobin 12.5 (L) 13.0 - 17.0 g/dL   HCT 35.9 (L) 39.0 - 52.0 %   MCV 94.7 78.0 - 100.0 fL   MCH 33.0 26.0 - 34.0 pg   MCHC 34.8 30.0 - 36.0 g/dL   RDW 15.4 11.5 - 15.5 %   Platelets 308 150 - 400 K/uL   Neutrophils Relative % 92 %   Neutro Abs 16.5 (H) 1.7 - 7.7 K/uL   Lymphocytes Relative 4 %   Lymphs Abs 0.8 0.7 - 4.0 K/uL   Monocytes Relative 4 %   Monocytes Absolute 0.7 0.1 - 1.0 K/uL   Eosinophils Relative 0 %   Eosinophils Absolute 0.0 0.0 - 0.7 K/uL   Basophils Relative 0 %   Basophils Absolute 0.0 0.0 - 0.1 K/uL  Protime-INR     Status: Abnormal   Collection Time: 12/10/16 11:50 AM  Result Value Ref Range   Prothrombin Time 18.2 (H) 11.4 - 15.2 seconds   INR 1.50   I-Stat CG4 Lactic Acid, ED     Status: None   Collection Time: 12/10/16 12:14 PM  Result Value Ref Range   Lactic Acid, Venous 1.56 0.5 - 1.9 mmol/L  Culture, blood (Routine x 2)     Status: None (Preliminary result)   Collection Time: 12/10/16 12:42 PM  Result Value Ref Range   Specimen Description BLOOD    Special Requests NONE    Culture NO GROWTH <12 HOURS    Report Status PENDING   Culture, blood (Routine x 2)     Status: None (Preliminary result)   Collection Time: 12/10/16 12:51 PM  Result Value Ref Range   Specimen Description BLOOD RIGHT ARM    Special Requests      BOTTLES DRAWN AEROBIC AND ANAEROBIC Blood Culture adequate volume   Culture NO GROWTH < 12 HOURS    Report Status PENDING  Ammonia     Status: None   Collection Time: 12/10/16 12:51 PM  Result Value Ref Range   Ammonia 25 9 - 35 umol/L  I-Stat CG4 Lactic Acid, ED     Status: None   Collection Time: 12/10/16  2:16 PM  Result Value Ref Range   Lactic Acid, Venous 0.80 0.5 - 1.9 mmol/L    Ct Head Wo Contrast  Result Date: 12/10/2016 CLINICAL DATA:  Fall yesterday.  Lethargic EXAM: CT HEAD WITHOUT CONTRAST TECHNIQUE: Contiguous axial images were obtained from the base of the skull through the vertex without intravenous contrast. COMPARISON:  CT 12/09/2016 FINDINGS: Brain: Mild atrophy unchanged. Negative for hydrocephalus. Chronic ischemic changes in the white matter including the left internal and external capsule. Negative for acute infarct, hemorrhage, or mass lesion. No fluid collection Vascular: Negative for hyperdense vessel Skull: Negative Sinuses/Orbits: Mild mucosal edema right maxillary sinus and left ethmoid sinus. Negative orbit. Other: None IMPRESSION: No acute intracranial abnormality.  Stable since the recent study. Electronically Signed   By: Franchot Gallo M.D.   On: 12/10/2016 13:31   Ct Head Wo Contrast  Result Date: 12/09/2016 CLINICAL DATA:  Pain following fall EXAM: CT HEAD WITHOUT CONTRAST CT CERVICAL SPINE WITHOUT CONTRAST TECHNIQUE: Multidetector CT imaging of the head and cervical spine was performed following the standard protocol without intravenous contrast. Multiplanar CT image reconstructions of the cervical spine were also generated. COMPARISON:  Head CT November 08, 2016 FINDINGS: CT HEAD FINDINGS Brain: Mild diffuse atrophy is stable. There is no intracranial mass, hemorrhage, extra-axial fluid collection, or midline shift there is patchy small vessel disease in the centra semiovale bilaterally. There is evidence of a prior infarct involving much of the left external capsule, primarily anteriorly. There is a prior infarct in the lateral left thalamus. There is no new gray-white compartment  lesion. No acute infarct is evident. Vascular: No hyperdense vessel. There is calcification in each carotid siphon region. Skull: Bony calvarium appears intact. Sinuses/Orbits: There is mucosal thickening with retention cyst in the posterior right maxillary antrum. There is opacification at several sites in left ethmoid region with mucosal thickening in multiple ethmoid air cells bilaterally. Other visualized paranasal sinuses are clear. Orbits appear symmetric bilaterally. Other: Mastoid air cells are clear. CT CERVICAL SPINE FINDINGS Alignment: There is no spondylolisthesis. Skull base and vertebrae: The skull base and craniocervical junction regions appear normal. There is no evident fracture. There is erosive change in the odontoid. There are no blastic or lytic bone lesions. Soft tissues and spinal canal: Prevertebral soft tissues and predental space regions are normal. There are no paraspinous lesions. No cord or canal hematoma evident. Disc levels: There is moderate disc space narrowing at C6-7. There is facet hypertrophy at several levels. No nerve root edema or effacement. No disc extrusion or stenosis. Upper chest: Visualized upper lung regions appear normal. Other: There is calcification in each proximal subclavian artery as well as in each carotid artery. IMPRESSION: CT head: Atrophy with periventricular small vessel disease. 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 No acute infarct. No intracranial mass, hemorrhage, or extra-axial fluid collection. Foci of arteriovascular calcification noted. Air areas of paranasal sinus disease noted. CT cervical spine: No fracture or spondylolisthesis. Erosive change in the odontoid without impending fracture evident. Areas of osteoarthritic change. No disc extrusion or stenosis. Multiple foci of vascular calcification including foci of calcification in each carotid artery. Electronically Signed   By: Lowella Grip III M.D.  On: 12/09/2016 18:13   Ct Cervical Spine Wo Contrast  Result Date: 12/09/2016 CLINICAL DATA:  Pain following fall EXAM: CT HEAD WITHOUT CONTRAST CT CERVICAL SPINE WITHOUT CONTRAST TECHNIQUE: Multidetector CT imaging of the head and cervical spine was performed following the standard protocol without intravenous contrast. Multiplanar CT image reconstructions of the cervical spine were also generated. COMPARISON:  Head CT November 08, 2016 FINDINGS: CT HEAD FINDINGS Brain: Mild diffuse atrophy is stable. There is no intracranial mass, hemorrhage, extra-axial fluid collection, or midline shift there is patchy small vessel disease in the centra semiovale bilaterally. There is evidence of a prior infarct involving much of the left external capsule, primarily anteriorly. There is a prior infarct in the lateral left thalamus. There is no new gray-white compartment lesion. No acute infarct is evident. Vascular: No hyperdense vessel. There is calcification in each carotid siphon region. Skull: Bony calvarium appears intact. Sinuses/Orbits: There is mucosal thickening with retention cyst in the posterior right maxillary antrum. There is opacification at several sites in left ethmoid region with mucosal thickening in multiple ethmoid air cells bilaterally. Other visualized paranasal sinuses are clear. Orbits appear symmetric bilaterally. Other: Mastoid air cells are clear. CT CERVICAL SPINE FINDINGS Alignment: There is no spondylolisthesis. Skull base and vertebrae: The skull base and craniocervical junction regions appear normal. There is no evident fracture. There is erosive change in the odontoid. There are no blastic or lytic bone lesions. Soft tissues and spinal canal: Prevertebral soft tissues and predental space regions are normal. There are no paraspinous lesions. No cord or canal hematoma evident. Disc levels: There is moderate disc space narrowing at C6-7. There is facet hypertrophy at several levels. No nerve root  edema or effacement. No disc extrusion or stenosis. Upper chest: Visualized upper lung regions appear normal. Other: There is calcification in each proximal subclavian artery as well as in each carotid artery. IMPRESSION: CT head: Atrophy with periventricular small vessel disease. 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 No acute infarct. No intracranial mass, hemorrhage, or extra-axial fluid collection. Foci of arteriovascular calcification noted. Air areas of paranasal sinus disease noted. CT cervical spine: No fracture or spondylolisthesis. Erosive change in the odontoid without impending fracture evident. Areas of osteoarthritic change. No disc extrusion or stenosis. Multiple foci of vascular calcification including foci of calcification in each carotid artery. Electronically Signed   By: Lowella Grip III M.D.   On: 12/09/2016 18:13   US Abdomen Complete  Result Date: 12/10/2016 CLINICAL DATA:  66 y/o M; increased liver function tests, gallstones, altered mental status. EXAM: ABDOMEN ULTRASOUND COMPLETE COMPARISON:  11/24/2016 abdominal ultrasound. 11/23/2016 CT of the abdomen and pelvis. FINDINGS: Gallbladder: No gallbladder wall thickening or pericholecystic fluid and negative sonographic Murphy's sign. Gallstones measuring up to 2.7 cm. Some additional stones and sludge adherent to the wall of the gallbladder. Common bile duct: Diameter: 6 mm Liver: No focal lesion identified. Mildly increased echogenicity of the liver parenchyma. Normal direction of flow in main portal vein. IVC: No abnormality visualized. Pancreas: Obscured by bowel gas. Spleen: Size and appearance within normal limits. Right Kidney: Length: 11.2 cm. Increased renal echogenicity. No hydronephrosis or mass identified. Left Kidney: Length: 13.6 cm. Increase renal echogenicity. Simple appearing cyst measuring up to 2.3 cm within the upper pole. Abdominal aorta: Largely obscured by bowel gas. Other  findings: None. IMPRESSION: 1. Gallstones and sludge, some adherent to the wall of gallbladder. No secondary signs of acute cholecystitis. 2. Mildly increased liver echogenicity,  possible steatosis. 3. Increased renal echogenicity compatible with medical renal disease. Electronically Signed   By: Kristine Garbe M.D.   On: 12/10/2016 14:01   Dg Knee Complete 4 Views Left  Result Date: 12/09/2016 CLINICAL DATA:  Fall from bed.  Bilateral knee pain and erythema. EXAM: LEFT KNEE - COMPLETE 4+ VIEW COMPARISON:  01/07/2006 FINDINGS: Articular space narrowing, particularly in the medial compartment. Trace knee effusion.  Minimal marginal spurring along the patella. IMPRESSION: 1. Trace knee effusion. 2. Mild osteoarthritis. Electronically Signed   By: Van Clines M.D.   On: 12/09/2016 17:47   Dg Knee Complete 4 Views Right  Result Date: 12/09/2016 CLINICAL DATA:  Fall from bed, knee pain and erythema. EXAM: RIGHT KNEE - COMPLETE 4+ VIEW COMPARISON:  03/04/2015 FINDINGS: Motion artifact on several images. No fracture or overt knee effusion. Suspected mild prepatellar subcutaneous edema. Medial compartmental articular space narrowing. IMPRESSION: 1. Degenerative chondral thinning in the medial compartment. 2. Prepatellar subcutaneous edema. 3. Mildly reduced sensitivity due to motion artifact. Electronically Signed   By: Van Clines M.D.   On: 12/09/2016 17:50   Dg Abd Acute W/chest  Result Date: 12/10/2016 CLINICAL DATA:  66 year old male with Celsius difficile. Subsequent encounter. EXAM: DG ABDOMEN ACUTE W/ 1V CHEST COMPARISON:  11/23/2016 CT abdomen pelvis and chest x-ray. FINDINGS: Cardiomegaly. Pulmonary vascular prominence. PICC line tip caval distal superior vena cava/ atrial junction level. Calcified aorta with slight tortuosity. Gas-filled stomach. No plain film evidence of bowel obstruction or free intraperitoneal air. IMPRESSION: No plain film evidence of bowel obstruction or free  intraperitoneal air. Cardiomegaly. Pulmonary vascular prominence. Calcified slightly tortuous aorta. Electronically Signed   By: Genia Del M.D.   On: 12/10/2016 11:32    Review of Systems  Unable to perform ROS: Mental status change   Blood pressure (!) 156/79, pulse (!) 107, temperature (!) 96.8 F (36 C), temperature source Rectal, resp. rate 16, SpO2 96 %. Physical Exam  Constitutional: No distress.  HENT:  Dry oral mucosa  Eyes: Left eye exhibits no discharge.  Neck: No JVD present.  Musculoskeletal: He exhibits no edema.  Neurological:  Patient is very somnolent and doesn't, we can't. Goes back to sleep without answering questions. He has some shaking and tremor. Difficult to evaluate flapping tremor.  Skin: No erythema.    Assessment/Plan: Problem #1 altered mental status: Possibly a combination of uremia/medications such as than xanax/Neurontin/sedatives. Presently very difficult to get any information from the patient. Besides being somnolent patient is shaking. Problem #2 history of diabetes Problem #3 hypertension: His blood pressure is reasonably controlled Problem #4 history of CVA Problem #5 leukocytosis. Patient as stated above dictated for cellulitis and also he has C. difficile colitis for which she is getting antibiotics. Problem #6 peripheral vascular disease Plan: We'll make arrangements for patient to get dialysis today because of severe altered mental status and BUN of 80 and creatinine of 8. 2] we'll use normal saline at 135 mL per hour 3] we'll check renal panel in the morning.  4] once patient is adequately hydrated we'll use diuretics to improve his urine output 5] agree with folly cathter insertion Foley catheter insertion  Kylena Mole S 12/10/2016, 3:53 PM

## 2016-12-10 NOTE — ED Notes (Signed)
CRITICAL VALUE ALERT  Critical Value:  Troponin 0.04  Date & Time Notied: 12/10/2016 1245  Provider Notified: Dr. Thurnell Garbe   Orders Received/Actions taken: Notified MD

## 2016-12-10 NOTE — ED Notes (Signed)
Pt stable and ready for transport to ICU-03. Report given to [place], Therapist, sports.

## 2016-12-10 NOTE — H&P (Addendum)
History and Physical    Eric Scott:[id number] DOB: 1951-01-06 DOA: 12/10/2016  PCP: [name], MD  Patient coming from: SNF.    Chief Complaint:  Altered mental status.  In AKI with hypothermia.   HPI: Eric Scott is an 66 y.o. male with hx of anxiety, chronic pain, osteomyelitis on Dapsone and Cefepime thru PICC line, hx of DM, PVD, prior CVA, brought to the ER as he was having altered mental status.  He was essentially not responsive. CT of his head was negative for any acute process, amonia level was normal, and he has a leukocytosis with WBC of 18K.  He was found to be in AKI with Cr of 8.4 with Cr of 1.5 upon last discharge.  He was given IVF.  His LFTs were elevated to 200-300, and his abdominal US showed no acute cholecystitis or biliary obstruction and no hydronephrosis.  He has medical renal disease.  He was given IVF and hospitalist was asked to admit him for AKI, AMS, elevated liver Fx test, and hypothermia.    ED Course:  See above.  Rewiew of Systems: He opened his eyes, but doesn't answer any questions, and fell back to sleep.   Past Medical History:  Diagnosis Date  . Anxiety   . Chronic pain    legs, back; MRI 05/2012 with mild thoracic degenerative changes no spinal stenosis   . COPD (chronic obstructive pulmonary disease) ([place])   . Depression   . Diabetic peripheral neuropathy ([place])    "chronic" (05/27/2012)  . Diastolic CHF ([place])   . Hypercholesteremia   . Hypertension   . Peripheral edema   . PVD (peripheral vascular disease) ([place])   . Spinal stenosis    mild lumbar (MRI 05/2012)-L2-L3 to L4-L5 , mild lumbar foraminal stenosis   . Stroke ([place]) 05/2012   Subacute, lacunar infarcts within the left basal ganglia and posterior limp of the left internal capsule/thalamus; "RUE; both feet weak" (05/27/2012)  . Tremor   . Type II diabetes mellitus ([place])    Past Surgical History:  Procedure Laterality Date  . ANKLE SURGERY    . HERNIA REPAIR   01/05/2004   "belly button" (05/27/2012)     reports that he quit smoking about 1 years ago. His smoking use included Cigarettes. He has a 22.50 pack-year smoking history. He has never used smokeless tobacco. He reports that he does not drink alcohol or use drugs.  Allergies  Allergen Reactions  . Blueberry Flavor   . Cucumber Extract Other (See Comments)    "Fells like I'm having a heart attack"  . Flexeril [Cyclobenzaprine Hcl] Other (See Comments)    "whole body  Tremors" (05/27/2012)  . Kiwi Extract Other (See Comments)    "feels like I'm having a heart attack"    Family History  Problem Relation Age of Onset  . Family history unknown: Yes     Prior to Admission medications   Medication Sig Start Date End Date Taking? Authorizing Provider  albuterol (PROVENTIL) (2.5 MG/3ML) 0.083% nebulizer solution Take 2.5 mg by nebulization every 3 (three) hours as needed for wheezing or shortness of breath.   Yes [provider]  ALPRAZolam Duanne Moron) 1 MG tablet Take 1 tablet (1 mg total) by mouth 3 (three) times daily. 03/07/15  Yes Samuella Cota, MD  BREO ELLIPTA 100-25 MCG/INH AEPB  09/27/16  Yes [provider]  ceFEPIme 2 g in dextrose 5 % 50 mL Inject 2 g into the vein  every 12 (twelve) hours. Monitor labs and make dose adjustments per pharmacy protocol and input. 11/11/16  Yes Samuella Cota, MD  citalopram (CELEXA) 20 MG tablet Take 20 mg by mouth daily.   Yes [provider]  DAPTOmycin 800 mg in sodium chloride 0.9 % 100 mL Inject 800 mg into the vein daily. 11/29/16  Yes Isaac Bliss, Rayford Halsted, MD  fish oil-omega-3 fatty acids 1000 MG capsule Take 1 g by mouth 2 (two) times daily.   Yes [provider]  gabapentin (NEURONTIN) 800 MG tablet Take 800 mg by mouth 3 (three) times daily.   Yes [provider]  HUMALOG KWIKPEN 100 UNIT/ML KiwkPen Inject 2-14 Units into the skin 4 (four) times daily -  before meals and at bedtime. Per  sliding scale 150-200= 2 units 201-250= 4 units 251-300= 6 units 301-349= 8 units 350-400=10units 401-450=12units 451-500=14units ABOVE 501- CALL MD 10/07/16  Yes [provider]  ipratropium-albuterol (DUONEB) 0.5-2.5 (3) MG/3ML SOLN Inhale 3 mLs into the lungs 3 (three) times daily.  07/09/16  Yes [provider]  L-Methylfolate-Algae-B12-B6 Glade Stanford) 3-90.314-2-35 MG CAPS Take 1 capsule by mouth 3 (three) times daily.   Yes [provider]  l-methylfolate-B6-B12 (METANX) 3-35-2 MG TABS tablet Take 1 tablet by mouth 2 (two) times daily.   Yes [provider]  LANTUS SOLOSTAR 100 UNIT/ML Solostar Pen 24 Units daily at 10 pm.  10/29/16  Yes [provider]  NON FORMULARY Apply 1 application topically 2 (two) times daily. 14 day course starting on 12/07/2016  GREER GOO   Yes [provider]  oxyCODONE (OXY IR/ROXICODONE) 5 MG immediate release tablet Take 1 tablet (5 mg total) by mouth every 8 (eight) hours as needed for severe pain. 11/11/16  Yes Samuella Cota, MD  saccharomyces boulardii (FLORASTOR) 250 MG capsule Take 1 capsule (250 mg total) by mouth 3 (three) times daily. 11/29/16  Yes Isaac Bliss, Rayford Halsted, MD  vancomycin (VANCOCIN) 50 mg/mL oral solution Take 2.5 mLs (125 mg total) by mouth 4 (four) times daily. 11/29/16  Yes Isaac Bliss, Rayford Halsted, MD  Vitamin D, Ergocalciferol, (DRISDOL) 50000 units CAPS capsule Take 50,000 Units by mouth every 30 (thirty) days.   Yes [provider]  collagenase (SANTYL) ointment Apply topically daily. Apply Santyl to left heel and right outer foot Q day, then cover with moist gauze, then kerlex and tape. Patient not taking: Reported on 12/10/2016 11/11/16   Samuella Cota, MD    Physical Exam: Vitals:   12/10/16 1230 12/10/16 1330 12/10/16 1400 12/10/16 1430  BP: 112/65 (!) 152/71 (!) 159/76 (!) 156/79  Pulse: (!) 106 (!) 105 (!) 105 (!) 107  Resp: 18 16 16 16   Temp:        TempSrc:      SpO2: 96% 97% 96% 96%      Constitutional: NAD, calm, comfortable Vitals:   12/10/16 1230 12/10/16 1330 12/10/16 1400 12/10/16 1430  BP: 112/65 (!) 152/71 (!) 159/76 (!) 156/79  Pulse: (!) 106 (!) 105 (!) 105 (!) 107  Resp: 18 16 16 16   Temp:      TempSrc:      SpO2: 96% 97% 96% 96%   Eyes: PERRL, lids and conjunctivae normal ENMT: Mucous membranes are moist. Posterior pharynx clear of any exudate or lesions.Normal dentition.  Neck: normal, supple, no masses, no thyromegaly Respiratory: clear to auscultation bilaterally, no wheezing, no crackles. Normal respiratory effort. No accessory muscle use.  Cardiovascular: Regular rate  and rhythm, no murmurs / rubs / gallops. No extremity edema. 2+ pedal pulses. No carotid bruits.  Abdomen: no tenderness, no masses palpated. No hepatosplenomegaly. Bowel sounds positive.  Musculoskeletal: no clubbing / cyanosis. No joint deformity upper and lower extremities. Good ROM, no contractures. Normal muscle tone.  Skin: no rashes, lesions, ulcers. No induration Neurologic: He is not responsive.  Psychiatric: Opened eyes only.  Doesn't interact with sourrounding.     Labs on Admission: I have personally reviewed following labs and imaging studies  CBC:  Recent Labs Lab 12/10/16 1150  WBC 18.0*  NEUTROABS 16.5*  HGB 12.5*  HCT 35.9*  MCV 94.7  PLT 875   Basic Metabolic Panel:  Recent Labs Lab 12/10/16 1150  NA 137  K 3.8  CL 110  CO2 12*  GLUCOSE 231*  BUN 87*  CREATININE 8.37*  CALCIUM 8.0*   GFR: Estimated Creatinine Clearance: 11.9 mL/min (A) (by C-G formula based on SCr of 8.37 mg/dL (H)). Liver Function Tests:  Recent Labs Lab 12/10/16 1150  AST 243*  ALT 226*  ALKPHOS 102  BILITOT 0.6  PROT 6.8  ALBUMIN 2.2*    Recent Labs Lab 12/10/16 1251  AMMONIA 25   Coagulation Profile:  Recent Labs Lab 12/10/16 1150  INR 1.50   Cardiac Enzymes:  Recent Labs Lab 12/10/16 1056  TROPONINI  0.04*   Urine analysis:    Component Value Date/Time   COLORURINE YELLOW 12/10/2016 1041   APPEARANCEUR HAZY (A) 12/10/2016 1041   LABSPEC 1.016 12/10/2016 1041   PHURINE 5.0 12/10/2016 1041   GLUCOSEU NEGATIVE 12/10/2016 1041   HGBUR LARGE (A) 12/10/2016 1041   BILIRUBINUR NEGATIVE 12/10/2016 1041   KETONESUR 5 (A) 12/10/2016 1041   PROTEINUR 100 (A) 12/10/2016 1041   UROBILINOGEN 0.2 03/04/2015 1835   NITRITE NEGATIVE 12/10/2016 1041   LEUKOCYTESUR TRACE (A) 12/10/2016 1041   Recent Results (from the past 240 hour(s))  Culture, blood (Routine x 2)     Status: None (Preliminary result)   Collection Time: 12/10/16 12:42 PM  Result Value Ref Range Status   Specimen Description BLOOD  Final   Special Requests NONE  Final   Culture NO GROWTH <12 HOURS  Final   Report Status PENDING  Incomplete  Culture, blood (Routine x 2)     Status: None (Preliminary result)   Collection Time: 12/10/16 12:51 PM  Result Value Ref Range Status   Specimen Description BLOOD RIGHT ARM  Final   Special Requests   Final    BOTTLES DRAWN AEROBIC AND ANAEROBIC Blood Culture adequate volume   Culture NO GROWTH < 12 HOURS  Final   Report Status PENDING  Incomplete     Radiological Exams on Admission: Ct Head Wo Contrast  Result Date: 12/10/2016 CLINICAL DATA:  Fall yesterday.  Lethargic EXAM: CT HEAD WITHOUT CONTRAST TECHNIQUE: Contiguous axial images were obtained from the base of the skull through the vertex without intravenous contrast. COMPARISON:  CT 12/09/2016 FINDINGS: Brain: Mild atrophy unchanged. Negative for hydrocephalus. Chronic ischemic changes in the white matter including the left internal and external capsule. Negative for acute infarct, hemorrhage, or mass lesion. No fluid collection Vascular: Negative for hyperdense vessel Skull: Negative Sinuses/Orbits: Mild mucosal edema right maxillary sinus and left ethmoid sinus. Negative orbit. Other: None IMPRESSION: No acute intracranial  abnormality.  Stable since the recent study. Electronically Signed   By: Franchot Gallo M.D.   On: 12/10/2016 13:31   Ct Head Wo Contrast  Result Date: 12/09/2016 CLINICAL  DATA:  Pain following fall EXAM: CT HEAD WITHOUT CONTRAST CT CERVICAL SPINE WITHOUT CONTRAST TECHNIQUE: Multidetector CT imaging of the head and cervical spine was performed following the standard protocol without intravenous contrast. Multiplanar CT image reconstructions of the cervical spine were also generated. COMPARISON:  Head CT November 08, 2016 FINDINGS: CT HEAD FINDINGS Brain: Mild diffuse atrophy is stable. There is no intracranial mass, hemorrhage, extra-axial fluid collection, or midline shift there is patchy small vessel disease in the centra semiovale bilaterally. There is evidence of a prior infarct involving much of the left external capsule, primarily anteriorly. There is a prior infarct in the lateral left thalamus. There is no new gray-white compartment lesion. No acute infarct is evident. Vascular: No hyperdense vessel. There is calcification in each carotid siphon region. Skull: Bony calvarium appears intact. Sinuses/Orbits: There is mucosal thickening with retention cyst in the posterior right maxillary antrum. There is opacification at several sites in left ethmoid region with mucosal thickening in multiple ethmoid air cells bilaterally. Other visualized paranasal sinuses are clear. Orbits appear symmetric bilaterally. Other: Mastoid air cells are clear. CT CERVICAL SPINE FINDINGS Alignment: There is no spondylolisthesis. Skull base and vertebrae: The skull base and craniocervical junction regions appear normal. There is no evident fracture. There is erosive change in the odontoid. There are no blastic or lytic bone lesions. Soft tissues and spinal canal: Prevertebral soft tissues and predental space regions are normal. There are no paraspinous lesions. No cord or canal hematoma evident. Disc levels: There is moderate disc  space narrowing at C6-7. There is facet hypertrophy at several levels. No nerve root edema or effacement. No disc extrusion or stenosis. Upper chest: Visualized upper lung regions appear normal. Other: There is calcification in each proximal subclavian artery as well as in each carotid artery. IMPRESSION: CT head: Atrophy with periventricular small vessel disease. 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 No acute infarct. No intracranial mass, hemorrhage, or extra-axial fluid collection. Foci of arteriovascular calcification noted. Air areas of paranasal sinus disease noted. CT cervical spine: No fracture or spondylolisthesis. Erosive change in the odontoid without impending fracture evident. Areas of osteoarthritic change. No disc extrusion or stenosis. Multiple foci of vascular calcification including foci of calcification in each carotid artery. Electronically Signed   By: Lowella Grip III M.D.   On: 12/09/2016 18:13   Ct Cervical Spine Wo Contrast  Result Date: 12/09/2016 CLINICAL DATA:  Pain following fall EXAM: CT HEAD WITHOUT CONTRAST CT CERVICAL SPINE WITHOUT CONTRAST TECHNIQUE: Multidetector CT imaging of the head and cervical spine was performed following the standard protocol without intravenous contrast. Multiplanar CT image reconstructions of the cervical spine were also generated. COMPARISON:  Head CT November 08, 2016 FINDINGS: CT HEAD FINDINGS Brain: Mild diffuse atrophy is stable. There is no intracranial mass, hemorrhage, extra-axial fluid collection, or midline shift there is patchy small vessel disease in the centra semiovale bilaterally. There is evidence of a prior infarct involving much of the left external capsule, primarily anteriorly. There is a prior infarct in the lateral left thalamus. There is no new gray-white compartment lesion. No acute infarct is evident. Vascular: No hyperdense vessel. There is calcification in each carotid siphon region.  Skull: Bony calvarium appears intact. Sinuses/Orbits: There is mucosal thickening with retention cyst in the posterior right maxillary antrum. There is opacification at several sites in left ethmoid region with mucosal thickening in multiple ethmoid air cells bilaterally. Other visualized paranasal sinuses are clear. Orbits appear symmetric  bilaterally. Other: Mastoid air cells are clear. CT CERVICAL SPINE FINDINGS Alignment: There is no spondylolisthesis. Skull base and vertebrae: The skull base and craniocervical junction regions appear normal. There is no evident fracture. There is erosive change in the odontoid. There are no blastic or lytic bone lesions. Soft tissues and spinal canal: Prevertebral soft tissues and predental space regions are normal. There are no paraspinous lesions. No cord or canal hematoma evident. Disc levels: There is moderate disc space narrowing at C6-7. There is facet hypertrophy at several levels. No nerve root edema or effacement. No disc extrusion or stenosis. Upper chest: Visualized upper lung regions appear normal. Other: There is calcification in each proximal subclavian artery as well as in each carotid artery. IMPRESSION: CT head: Atrophy with periventricular small vessel disease. 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 No acute infarct. No intracranial mass, hemorrhage, or extra-axial fluid collection. Foci of arteriovascular calcification noted. Air areas of paranasal sinus disease noted. CT cervical spine: No fracture or spondylolisthesis. Erosive change in the odontoid without impending fracture evident. Areas of osteoarthritic change. No disc extrusion or stenosis. Multiple foci of vascular calcification including foci of calcification in each carotid artery. Electronically Signed   By: Lowella Grip III M.D.   On: 12/09/2016 18:13   US Abdomen Complete  Result Date: 12/10/2016 CLINICAL DATA:  66 y/o M; increased liver function  tests, gallstones, altered mental status. EXAM: ABDOMEN ULTRASOUND COMPLETE COMPARISON:  11/24/2016 abdominal ultrasound. 11/23/2016 CT of the abdomen and pelvis. FINDINGS: Gallbladder: No gallbladder wall thickening or pericholecystic fluid and negative sonographic Murphy's sign. Gallstones measuring up to 2.7 cm. Some additional stones and sludge adherent to the wall of the gallbladder. Common bile duct: Diameter: 6 mm Liver: No focal lesion identified. Mildly increased echogenicity of the liver parenchyma. Normal direction of flow in main portal vein. IVC: No abnormality visualized. Pancreas: Obscured by bowel gas. Spleen: Size and appearance within normal limits. Right Kidney: Length: 11.2 cm. Increased renal echogenicity. No hydronephrosis or mass identified. Left Kidney: Length: 13.6 cm. Increase renal echogenicity. Simple appearing cyst measuring up to 2.3 cm within the upper pole. Abdominal aorta: Largely obscured by bowel gas. Other findings: None. IMPRESSION: 1. Gallstones and sludge, some adherent to the wall of gallbladder. No secondary signs of acute cholecystitis. 2. Mildly increased liver echogenicity, possible steatosis. 3. Increased renal echogenicity compatible with medical renal disease. Electronically Signed   By: Kristine Garbe M.D.   On: 12/10/2016 14:01   Dg Knee Complete 4 Views Left  Result Date: 12/09/2016 CLINICAL DATA:  Fall from bed.  Bilateral knee pain and erythema. EXAM: LEFT KNEE - COMPLETE 4+ VIEW COMPARISON:  01/07/2006 FINDINGS: Articular space narrowing, particularly in the medial compartment. Trace knee effusion.  Minimal marginal spurring along the patella. IMPRESSION: 1. Trace knee effusion. 2. Mild osteoarthritis. Electronically Signed   By: Van Clines M.D.   On: 12/09/2016 17:47   Dg Knee Complete 4 Views Right  Result Date: 12/09/2016 CLINICAL DATA:  Fall from bed, knee pain and erythema. EXAM: RIGHT KNEE - COMPLETE 4+ VIEW COMPARISON:  03/04/2015  FINDINGS: Motion artifact on several images. No fracture or overt knee effusion. Suspected mild prepatellar subcutaneous edema. Medial compartmental articular space narrowing. IMPRESSION: 1. Degenerative chondral thinning in the medial compartment. 2. Prepatellar subcutaneous edema. 3. Mildly reduced sensitivity due to motion artifact. Electronically Signed   By: Van Clines M.D.   On: 12/09/2016 17:50   Dg Abd Acute W/chest  Result  Date: 12/10/2016 CLINICAL DATA:  66 year old male with Celsius difficile. Subsequent encounter. EXAM: DG ABDOMEN ACUTE W/ 1V CHEST COMPARISON:  11/23/2016 CT abdomen pelvis and chest x-ray. FINDINGS: Cardiomegaly. Pulmonary vascular prominence. PICC line tip caval distal superior vena cava/ atrial junction level. Calcified aorta with slight tortuosity. Gas-filled stomach. No plain film evidence of bowel obstruction or free intraperitoneal air. IMPRESSION: No plain film evidence of bowel obstruction or free intraperitoneal air. Cardiomegaly. Pulmonary vascular prominence. Calcified slightly tortuous aorta. Electronically Signed   By: Genia Del M.D.   On: 12/10/2016 11:32    EKG: Independently reviewed.  Assessment/Plan Principal Problem:   Uremia Active Problems:   COPD with exacerbation ([name])   AKI (acute kidney injury) ([place])   Osteomyelitis ([name])   Volume depletion   Hypotension    PLAN:   AMS:  I suspect this is uremia.  It could be from Xanax and narcotics in the setting of AKI.   Spoke with Dr Chilton Greathouse and after careful consideration, he recommended urgent hemodyalysis tonight.  He does have asterixis on both hands, and occasional "uremic" twiches to me.  Have asked Dr Arnoldo Morale to place a dialysis catheter for dialysis.  Will give D5 1/4 NS with 50 mE of bicarb.  Admit to the ICU.  I called family to update his critical condition, but unable to reach son or niece.    Osteomyelitis:  Will continue with IV Van/ Cefepime, but will need re-dosing with  pharmacist's assistance.    Hx of C diff:  Continue with enteric precaution.  If he has more diarrhea, will re assess.   Hypothermia:  Lactic  Is normal.   Could be early sepsis, but I don't think so.  Bear hugger, and check TSH.   COPD:  Stable.   DVT prophylaxis: subQ Heparin.  Code Status: MOST form:  No tube feeding.  Family Communication: Atttempted, but unable.  Disposition Plan: SNF  Consults called: surgery, and renal.  Admission status: Inpatient.    Sayer Masini MD FACP. [facility]  If 7PM-7AM, please contact night-coverage [email or web address] Password [email or web address]  12/10/2016, 4:02 PM

## 2016-12-10 NOTE — Procedures (Signed)
    INITIAL HEMODIALYSIS TREATMENT NOTE:  First ever HD completed via right femoral non-tunneled catheter. No fluid removed per nephrologist's order.  Hemodynamically stable throughout HD session.  Catheter tolerated Qb 200 with high venous pressures (>240).  All blood was returned.    Rockwell Alexandria, RN, CDN

## 2016-12-10 NOTE — ED Triage Notes (Signed)
Pt from [facility]. Fell and face planted from chair yesterday reaching for something and seen here. Pt back today with altered LOC. Pt has current C DIFF and osteomyelitis in foot which needs amputation but pt refuses. Pt is lethargic. Opens eyes spontaneiously. Will look at speaker and shakes head yes or no but not speaking. Confused. abd distended. PICC line to r arm. bs 231. Temp 99.0 at [facility].

## 2016-12-11 ENCOUNTER — Inpatient Hospital Stay (HOSPITAL_COMMUNITY): Payer: Medicare Other

## 2016-12-11 DIAGNOSIS — R9431 Abnormal electrocardiogram [ECG] [EKG]: Secondary | ICD-10-CM

## 2016-12-11 LAB — RENAL FUNCTION PANEL
ALBUMIN: 2.1 g/dL — AB (ref 3.5–5.0)
Anion gap: 12 (ref 5–15)
BUN: 72 mg/dL — AB (ref 6–20)
CALCIUM: 7.7 mg/dL — AB (ref 8.9–10.3)
CHLORIDE: 113 mmol/L — AB (ref 101–111)
CO2: 15 mmol/L — ABNORMAL LOW (ref 22–32)
CREATININE: 6.98 mg/dL — AB (ref 0.61–1.24)
GFR calc Af Amer: 8 mL/min — ABNORMAL LOW (ref >60)
GFR, EST NON AFRICAN AMERICAN: 7 mL/min — AB (ref >60)
Glucose, Bld: 163 mg/dL — ABNORMAL HIGH (ref 65–99)
Phosphorus: 6.2 mg/dL — ABNORMAL HIGH (ref 2.5–4.6)
Potassium: 3.3 mmol/L — ABNORMAL LOW (ref 3.5–5.1)
SODIUM: 140 mmol/L (ref 135–145)

## 2016-12-11 LAB — CBC
HCT: 33.8 % — ABNORMAL LOW (ref 39.0–52.0)
Hemoglobin: 11.8 g/dL — ABNORMAL LOW (ref 13.0–17.0)
MCH: 32.9 pg (ref 26.0–34.0)
MCHC: 34.9 g/dL (ref 30.0–36.0)
MCV: 94.2 fL (ref 78.0–100.0)
PLATELETS: 290 10*3/uL (ref 150–400)
RBC: 3.59 MIL/uL — ABNORMAL LOW (ref 4.22–5.81)
RDW: 15.5 % (ref 11.5–15.5)
WBC: 14.9 10*3/uL — AB (ref 4.0–10.5)

## 2016-12-11 LAB — GLUCOSE, CAPILLARY
GLUCOSE-CAPILLARY: 202 mg/dL — AB (ref 65–99)
Glucose-Capillary: 151 mg/dL — ABNORMAL HIGH (ref 65–99)
Glucose-Capillary: 165 mg/dL — ABNORMAL HIGH (ref 65–99)
Glucose-Capillary: 167 mg/dL — ABNORMAL HIGH (ref 65–99)
Glucose-Capillary: 183 mg/dL — ABNORMAL HIGH (ref 65–99)
Glucose-Capillary: 195 mg/dL — ABNORMAL HIGH (ref 65–99)

## 2016-12-11 LAB — HEPATITIS B SURFACE ANTIBODY, QUANTITATIVE: Hepatitis B-Post: <3.1 m[IU]/mL — ABNORMAL LOW (ref >9.9)

## 2016-12-11 LAB — HEPATITIS B CORE ANTIBODY, TOTAL: Hep B Core Total Ab: NEGATIVE

## 2016-12-11 LAB — HEPATITIS B SURFACE ANTIGEN: HEP B S AG: NEGATIVE

## 2016-12-11 LAB — TSH: TSH: 1.48 u[IU]/mL (ref 0.350–4.500)

## 2016-12-11 MED ORDER — STERILE WATER FOR INJECTION IJ SOLN
INTRAMUSCULAR | Status: AC
  Administered 2016-12-11: 4.4 mL
  Filled 2016-12-11: qty 10

## 2016-12-11 MED ORDER — DEXTROSE 5 % IV SOLN
2.0000 g | Freq: Once | INTRAVENOUS | Status: AC
  Administered 2016-12-11: 2 g via INTRAVENOUS
  Filled 2016-12-11: qty 2

## 2016-12-11 MED ORDER — VANCOMYCIN 50 MG/ML ORAL SOLUTION
500.0000 mg | Freq: Four times a day (QID) | ORAL | Status: DC
  Administered 2016-12-11 – 2016-12-17 (×25): 500 mg via ORAL
  Filled 2016-12-11 (×34): qty 10

## 2016-12-11 MED ORDER — SODIUM BICARBONATE 8.4 % IV SOLN
INTRAVENOUS | Status: DC
  Administered 2016-12-11 – 2016-12-16 (×9): via INTRAVENOUS
  Filled 2016-12-11 (×16): qty 1000

## 2016-12-11 MED ORDER — ALTEPLASE 2 MG IJ SOLR
INTRAMUSCULAR | Status: AC
  Administered 2016-12-11: 4 mg
  Filled 2016-12-11: qty 8

## 2016-12-11 MED ORDER — HEPARIN SODIUM (PORCINE) 1000 UNIT/ML IJ SOLN
INTRAMUSCULAR | Status: AC
  Administered 2016-12-11: 13:00:00
  Filled 2016-12-11: qty 4

## 2016-12-11 MED ORDER — SODIUM CHLORIDE 0.9 % IV SOLN: 100.0000 mL | INTRAVENOUS | Status: DC | PRN

## 2016-12-11 MED ORDER — HEPARIN SODIUM (PORCINE) 5000 UNIT/ML IJ SOLN
5000.0000 [IU] | Freq: Three times a day (TID) | INTRAMUSCULAR | Status: DC
  Administered 2016-12-11 – 2016-12-18 (×22): 5000 [IU] via SUBCUTANEOUS
  Filled 2016-12-11 (×22): qty 1

## 2016-12-11 MED ORDER — SODIUM BICARBONATE 8.4 % IV SOLN
INTRAVENOUS | Status: AC
  Filled 2016-12-11: qty 50

## 2016-12-11 MED ORDER — INSULIN ASPART 100 UNIT/ML ~~LOC~~ SOLN
0.0000 [IU] | SUBCUTANEOUS | Status: DC
  Administered 2016-12-11: 2 [IU] via SUBCUTANEOUS
  Administered 2016-12-11: 3 [IU] via SUBCUTANEOUS
  Administered 2016-12-12 (×2): 2 [IU] via SUBCUTANEOUS
  Administered 2016-12-12 (×2): 3 [IU] via SUBCUTANEOUS
  Administered 2016-12-13: 2 [IU] via SUBCUTANEOUS
  Administered 2016-12-13: 3 [IU] via SUBCUTANEOUS
  Administered 2016-12-13 – 2016-12-15 (×10): 2 [IU] via SUBCUTANEOUS
  Administered 2016-12-15: 3 [IU] via SUBCUTANEOUS
  Administered 2016-12-15: 2 [IU] via SUBCUTANEOUS
  Administered 2016-12-15: 5 [IU] via SUBCUTANEOUS
  Administered 2016-12-15: 2 [IU] via SUBCUTANEOUS
  Administered 2016-12-15 – 2016-12-16 (×2): 3 [IU] via SUBCUTANEOUS
  Administered 2016-12-16: 2 [IU] via SUBCUTANEOUS
  Administered 2016-12-16: 3 [IU] via SUBCUTANEOUS
  Administered 2016-12-16 (×3): 2 [IU] via SUBCUTANEOUS
  Administered 2016-12-17 – 2016-12-18 (×6): 1 [IU] via SUBCUTANEOUS
  Administered 2016-12-18: 2 [IU] via SUBCUTANEOUS
  Administered 2016-12-18: 1 [IU] via SUBCUTANEOUS
  Administered 2016-12-19 (×6): 2 [IU] via SUBCUTANEOUS
  Administered 2016-12-19: 3 [IU] via SUBCUTANEOUS
  Administered 2016-12-20 (×2): 2 [IU] via SUBCUTANEOUS
  Administered 2016-12-20 (×2): 3 [IU] via SUBCUTANEOUS
  Administered 2016-12-20: 2 [IU] via SUBCUTANEOUS
  Administered 2016-12-21: 1 [IU] via SUBCUTANEOUS
  Administered 2016-12-21 (×2): 2 [IU] via SUBCUTANEOUS
  Administered 2016-12-21: 3 [IU] via SUBCUTANEOUS
  Administered 2016-12-21 – 2016-12-23 (×8): 2 [IU] via SUBCUTANEOUS
  Administered 2016-12-23: 1 [IU] via SUBCUTANEOUS
  Administered 2016-12-23: 2 [IU] via SUBCUTANEOUS
  Administered 2016-12-23: 1 [IU] via SUBCUTANEOUS
  Administered 2016-12-23: 2 [IU] via SUBCUTANEOUS
  Administered 2016-12-23 – 2016-12-24 (×2): 1 [IU] via SUBCUTANEOUS
  Administered 2016-12-24: 2 [IU] via SUBCUTANEOUS
  Administered 2016-12-24: 3 [IU] via SUBCUTANEOUS

## 2016-12-11 MED ORDER — SODIUM CHLORIDE 0.9 % IV SOLN
800.0000 mg | Freq: Once | INTRAVENOUS | Status: AC
  Administered 2016-12-12: 800 mg via INTRAVENOUS
  Filled 2016-12-11: qty 16

## 2016-12-11 NOTE — Care Management Note (Signed)
Case Management Note  Patient Details  Name: Eric Scott MRN: [id number] Date of Birth: 1950/08/21  Subjective/Objective:  Adm with AKI, AMS, hypothermia. From SNF. Urgently dialysed. Has osteomyelitis in right fifth metatarsal head and fifth proximal joint.                Action/Plan: Anticipate DC back to SNF at time of discharge. CSW consulted.   Expected Discharge Date:    12/13/2016              Expected Discharge Plan:  Skilled Nursing Facility  In-House Referral:  Clinical Social Work  Discharge planning Services  CM Consult  Post Acute Care Choice:    Choice offered to:     DME Arranged:    DME Agency:     HH Arranged:    [place] Agency:     Status of Service:  In process, will continue to follow  If discussed at [place] Length of Stay Meetings, dates discussed:    Additional Comments:  Frederik Standley, Chauncey [place], RN 12/11/2016, 1:42 PM

## 2016-12-11 NOTE — Progress Notes (Addendum)
PROGRESS NOTE    Eric Scott  GUR:[id number] DOB: 26-May-1951 DOA: 12/10/2016 PCP: [name], MD    Brief Narrative: 66 yo SNF resident with hx of COPD, CKD, Osteo with heel wound on IV Cubicin and Cefepime, admitted for altered mental status (unresponsive), with severe AKI and hypothermia.  He was urgently dyalised for possible uremia, and antibiotics continued.  He continues to have diarrhea, with recent diagnosis of C diff.    Assessment & Plan:   Principal Problem:   Uremia Active Problems:   COPD with exacerbation ([name])   AKI (acute kidney injury) ([place])   Osteomyelitis ([name])   Volume depletion   Hypotension   1. AMS:  Either medication induced altered mental status, or uremia.  Will continue with dialysis and hold sedative.  He is tiny bit better.  I have let the family know that he is CRITICALLY ILL.  2. AKI:  Continue with IVF and dialysis.  Appreciate Dr Rhona Leavens and Dr Cam Hai help.  Antibiotic dosing per pharmacy. 3. Osteomyelitis:  Will continue with IV Cefepime and Cubicin IV.  Follow up with Dr Sharol Given.  Continue with Daptomycin.  I will need pharmacy's help on this.  4. C diff:  Will give oral vancomycin.  If he is not able to take orally, will consider enema or IV Flagyl.   DVT prophylaxis: subQ heparin.  Code Status: FULL CODE  (MOST form:  No tube feeding). Family Communication: finally able to speak with Niece.   Son is aware. Disposition Plan: SNF.   Consultants:   Surgery  Nephrology.  Procedures:      Dialysis Catheter on right femoral.     Urgent dialysis.Marland Kitchen  Antimicrobials: Anti-infectives    Start     Dose/Rate Route Frequency Ordered Stop   12/11/16 0845  vancomycin (VANCOCIN) 50 mg/mL oral solution 500 mg     500 mg Oral Every 6 hours 12/11/16 0836 12/25/16 0559       Subjective:  Unable to voice any complaints  Objective: Vitals:   12/11/16 0000 12/11/16 0100 12/11/16 0400 12/11/16 0500  BP: (!) 159/67 (!) 157/80    Pulse: (!)  107 (!) 107    Resp: 17 17    Temp: 98 F (36.7 C)  98.5 F (36.9 C)   TempSrc: Axillary  Axillary   SpO2: 93%     Weight: 115.5 kg (254 lb 10.1 oz)   115.9 kg (255 lb 8.2 oz)  Height:        Intake/Output Summary (Last 24 hours) at 12/11/16 0914 Last data filed at 12/11/16 0500  Gross per 24 hour  Intake               99 ml  Output              200 ml  Net             -101 ml   Filed Weights   12/10/16 1846 12/11/16 0000 12/11/16 0500  Weight: 115.6 kg (254 lb 13.6 oz) 115.5 kg (254 lb 10.1 oz) 115.9 kg (255 lb 8.2 oz)    Examination:  General exam: Appears calm and comfortable  Respiratory system: Clear to auscultation. Respiratory effort normal. Cardiovascular system: S1 & S2 heard, RRR. No JVD, murmurs, rubs, gallops or clicks. No pedal edema. Gastrointestinal system: Abdomen is nondistended, soft and nontender. No organomegaly or masses felt. Normal bowel sounds heard. Central nervous system: Alert and oriented. No focal neurological deficits. Extremities: some asterexis.  Moves all 4.  Wound is clean.  Skin: No rashes.   Psychiatry: unable to evaluate.   Data Reviewed: I have personally reviewed following labs and imaging studies  CBC:  Recent Labs Lab 12/10/16 1150 12/11/16 0434  WBC 18.0* 14.9*  NEUTROABS 16.5*  --   HGB 12.5* 11.8*  HCT 35.9* 33.8*  MCV 94.7 94.2  PLT 308 458   Basic Metabolic Panel:  Recent Labs Lab 12/10/16 1150 12/10/16 1851 12/11/16 0434  NA 137 139 140  K 3.8 3.8 3.3*  CL 110 112* 113*  CO2 12* 12* 15*  GLUCOSE 231* 192* 163*  BUN 87* 90* 72*  CREATININE 8.37* 8.55* 6.98*  CALCIUM 8.0* 7.9* 7.7*  PHOS  --  8.4* 6.2*   GFR: Estimated Creatinine Clearance: 13.7 mL/min (A) (by C-G formula based on SCr of 6.98 mg/dL (H)). Liver Function Tests:  Recent Labs Lab 12/10/16 1150 12/10/16 1851 12/11/16 0434  AST 243*  --   --   ALT 226*  --   --   ALKPHOS 102  --   --   BILITOT 0.6  --   --   PROT 6.8  --   --     ALBUMIN 2.2* 2.0* 2.1*   No results for input(s): LIPASE, AMYLASE in the last 168 hours.  Recent Labs Lab 12/10/16 1251  AMMONIA 25   Coagulation Profile:  Recent Labs Lab 12/10/16 1150  INR 1.50   Cardiac Enzymes:  Recent Labs Lab 12/10/16 1056  TROPONINI 0.04*   BNP (last 3 results) No results for input(s): PROBNP in the last 8760 hours. HbA1C: No results for input(s): HGBA1C in the last 72 hours. CBG:  Recent Labs Lab 12/10/16 2258 12/11/16 0044 12/11/16 0417 12/11/16 0850  GLUCAP 171* 183* 165* 167*   Lipid Profile: Sepsis Labs:  Recent Labs Lab 12/10/16 1214 12/10/16 1416  LATICACIDVEN 1.56 0.80    Recent Results (from the past 240 hour(s))  Culture, blood (Routine x 2)     Status: None (Preliminary result)   Collection Time: 12/10/16 12:42 PM  Result Value Ref Range Status   Specimen Description BLOOD PORT DRAW  Final   Special Requests Blood Culture adequate volume  Final   Culture NO GROWTH < 24 HOURS  Final   Report Status PENDING  Incomplete  Culture, blood (Routine x 2)     Status: None (Preliminary result)   Collection Time: 12/10/16 12:51 PM  Result Value Ref Range Status   Specimen Description BLOOD RIGHT ARM  Final   Special Requests   Final    BOTTLES DRAWN AEROBIC AND ANAEROBIC Blood Culture adequate volume   Culture NO GROWTH < 24 HOURS  Final   Report Status PENDING  Incomplete     Radiology Studies: Ct Head Wo Contrast  Result Date: 12/10/2016 CLINICAL DATA:  Fall yesterday.  Lethargic EXAM: CT HEAD WITHOUT CONTRAST TECHNIQUE: Contiguous axial images were obtained from the base of the skull through the vertex without intravenous contrast. COMPARISON:  CT 12/09/2016 FINDINGS: Brain: Mild atrophy unchanged. Negative for hydrocephalus. Chronic ischemic changes in the white matter including the left internal and external capsule. Negative for acute infarct, hemorrhage, or mass lesion. No fluid collection Vascular: Negative for  hyperdense vessel Skull: Negative Sinuses/Orbits: Mild mucosal edema right maxillary sinus and left ethmoid sinus. Negative orbit. Other: None IMPRESSION: No acute intracranial abnormality.  Stable since the recent study. Electronically Signed   By: Franchot Gallo M.D.   On: 12/10/2016 13:31  Ct Head Wo Contrast  Result Date: 12/09/2016 CLINICAL DATA:  Pain following fall EXAM: CT HEAD WITHOUT CONTRAST CT CERVICAL SPINE WITHOUT CONTRAST TECHNIQUE: Multidetector CT imaging of the head and cervical spine was performed following the standard protocol without intravenous contrast. Multiplanar CT image reconstructions of the cervical spine were also generated. COMPARISON:  Head CT November 08, 2016 FINDINGS: CT HEAD FINDINGS Brain: Mild diffuse atrophy is stable. There is no intracranial mass, hemorrhage, extra-axial fluid collection, or midline shift there is patchy small vessel disease in the centra semiovale bilaterally. There is evidence of a prior infarct involving much of the left external capsule, primarily anteriorly. There is a prior infarct in the lateral left thalamus. There is no new gray-white compartment lesion. No acute infarct is evident. Vascular: No hyperdense vessel. There is calcification in each carotid siphon region. Skull: Bony calvarium appears intact. Sinuses/Orbits: There is mucosal thickening with retention cyst in the posterior right maxillary antrum. There is opacification at several sites in left ethmoid region with mucosal thickening in multiple ethmoid air cells bilaterally. Other visualized paranasal sinuses are clear. Orbits appear symmetric bilaterally. Other: Mastoid air cells are clear. CT CERVICAL SPINE FINDINGS Alignment: There is no spondylolisthesis. Skull base and vertebrae: The skull base and craniocervical junction regions appear normal. There is no evident fracture. There is erosive change in the odontoid. There are no blastic or lytic bone lesions. Soft tissues and spinal  canal: Prevertebral soft tissues and predental space regions are normal. There are no paraspinous lesions. No cord or canal hematoma evident. Disc levels: There is moderate disc space narrowing at C6-7. There is facet hypertrophy at several levels. No nerve root edema or effacement. No disc extrusion or stenosis. Upper chest: Visualized upper lung regions appear normal. Other: There is calcification in each proximal subclavian artery as well as in each carotid artery. IMPRESSION: CT head: Atrophy with periventricular small vessel disease. 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 No acute infarct. No intracranial mass, hemorrhage, or extra-axial fluid collection. Foci of arteriovascular calcification noted. Air areas of paranasal sinus disease noted. CT cervical spine: No fracture or spondylolisthesis. Erosive change in the odontoid without impending fracture evident. Areas of osteoarthritic change. No disc extrusion or stenosis. Multiple foci of vascular calcification including foci of calcification in each carotid artery. Electronically Signed   By: Lowella Grip III M.D.   On: 12/09/2016 18:13   Ct Cervical Spine Wo Contrast  Result Date: 12/09/2016 CLINICAL DATA:  Pain following fall EXAM: CT HEAD WITHOUT CONTRAST CT CERVICAL SPINE WITHOUT CONTRAST TECHNIQUE: Multidetector CT imaging of the head and cervical spine was performed following the standard protocol without intravenous contrast. Multiplanar CT image reconstructions of the cervical spine were also generated. COMPARISON:  Head CT November 08, 2016 FINDINGS: CT HEAD FINDINGS Brain: Mild diffuse atrophy is stable. There is no intracranial mass, hemorrhage, extra-axial fluid collection, or midline shift there is patchy small vessel disease in the centra semiovale bilaterally. There is evidence of a prior infarct involving much of the left external capsule, primarily anteriorly. There is a prior infarct in the lateral  left thalamus. There is no new gray-white compartment lesion. No acute infarct is evident. Vascular: No hyperdense vessel. There is calcification in each carotid siphon region. Skull: Bony calvarium appears intact. Sinuses/Orbits: There is mucosal thickening with retention cyst in the posterior right maxillary antrum. There is opacification at several sites in left ethmoid region with mucosal thickening in multiple ethmoid air cells bilaterally.  Other visualized paranasal sinuses are clear. Orbits appear symmetric bilaterally. Other: Mastoid air cells are clear. CT CERVICAL SPINE FINDINGS Alignment: There is no spondylolisthesis. Skull base and vertebrae: The skull base and craniocervical junction regions appear normal. There is no evident fracture. There is erosive change in the odontoid. There are no blastic or lytic bone lesions. Soft tissues and spinal canal: Prevertebral soft tissues and predental space regions are normal. There are no paraspinous lesions. No cord or canal hematoma evident. Disc levels: There is moderate disc space narrowing at C6-7. There is facet hypertrophy at several levels. No nerve root edema or effacement. No disc extrusion or stenosis. Upper chest: Visualized upper lung regions appear normal. Other: There is calcification in each proximal subclavian artery as well as in each carotid artery. IMPRESSION: CT head: Atrophy with periventricular small vessel disease. 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 No acute infarct. No intracranial mass, hemorrhage, or extra-axial fluid collection. Foci of arteriovascular calcification noted. Air areas of paranasal sinus disease noted. CT cervical spine: No fracture or spondylolisthesis. Erosive change in the odontoid without impending fracture evident. Areas of osteoarthritic change. No disc extrusion or stenosis. Multiple foci of vascular calcification including foci of calcification in each carotid artery.  Electronically Signed   By: Lowella Grip III M.D.   On: 12/09/2016 18:13   US Abdomen Complete  Result Date: 12/10/2016 CLINICAL DATA:  66 y/o M; increased liver function tests, gallstones, altered mental status. EXAM: ABDOMEN ULTRASOUND COMPLETE COMPARISON:  11/24/2016 abdominal ultrasound. 11/23/2016 CT of the abdomen and pelvis. FINDINGS: Gallbladder: No gallbladder wall thickening or pericholecystic fluid and negative sonographic Murphy's sign. Gallstones measuring up to 2.7 cm. Some additional stones and sludge adherent to the wall of the gallbladder. Common bile duct: Diameter: 6 mm Liver: No focal lesion identified. Mildly increased echogenicity of the liver parenchyma. Normal direction of flow in main portal vein. IVC: No abnormality visualized. Pancreas: Obscured by bowel gas. Spleen: Size and appearance within normal limits. Right Kidney: Length: 11.2 cm. Increased renal echogenicity. No hydronephrosis or mass identified. Left Kidney: Length: 13.6 cm. Increase renal echogenicity. Simple appearing cyst measuring up to 2.3 cm within the upper pole. Abdominal aorta: Largely obscured by bowel gas. Other findings: None. IMPRESSION: 1. Gallstones and sludge, some adherent to the wall of gallbladder. No secondary signs of acute cholecystitis. 2. Mildly increased liver echogenicity, possible steatosis. 3. Increased renal echogenicity compatible with medical renal disease. Electronically Signed   By: Kristine Garbe M.D.   On: 12/10/2016 14:01   Dg Knee Complete 4 Views Left  Result Date: 12/09/2016 CLINICAL DATA:  Fall from bed.  Bilateral knee pain and erythema. EXAM: LEFT KNEE - COMPLETE 4+ VIEW COMPARISON:  01/07/2006 FINDINGS: Articular space narrowing, particularly in the medial compartment. Trace knee effusion.  Minimal marginal spurring along the patella. IMPRESSION: 1. Trace knee effusion. 2. Mild osteoarthritis. Electronically Signed   By: Van Clines M.D.   On: 12/09/2016 17:47    Dg Knee Complete 4 Views Right  Result Date: 12/09/2016 CLINICAL DATA:  Fall from bed, knee pain and erythema. EXAM: RIGHT KNEE - COMPLETE 4+ VIEW COMPARISON:  03/04/2015 FINDINGS: Motion artifact on several images. No fracture or overt knee effusion. Suspected mild prepatellar subcutaneous edema. Medial compartmental articular space narrowing. IMPRESSION: 1. Degenerative chondral thinning in the medial compartment. 2. Prepatellar subcutaneous edema. 3. Mildly reduced sensitivity due to motion artifact. Electronically Signed   By: Van Clines M.D.   On: 12/09/2016  17:50   Dg Abd Acute W/chest  Result Date: 12/10/2016 CLINICAL DATA:  65 year old male with Celsius difficile. Subsequent encounter. EXAM: DG ABDOMEN ACUTE W/ 1V CHEST COMPARISON:  11/23/2016 CT abdomen pelvis and chest x-ray. FINDINGS: Cardiomegaly. Pulmonary vascular prominence. PICC line tip caval distal superior vena cava/ atrial junction level. Calcified aorta with slight tortuosity. Gas-filled stomach. No plain film evidence of bowel obstruction or free intraperitoneal air. IMPRESSION: No plain film evidence of bowel obstruction or free intraperitoneal air. Cardiomegaly. Pulmonary vascular prominence. Calcified slightly tortuous aorta. Electronically Signed   By: Genia Del M.D.   On: 12/10/2016 11:32    Scheduled Meds: . Chlorhexidine Gluconate Cloth  6 each Topical Q0600  . insulin aspart  0-15 Units Subcutaneous Q4H  . mupirocin ointment  1 application Nasal BID  . vancomycin  500 mg Oral Q6H   Continuous Infusions: . sodium chloride 135 mL/hr at 12/11/16 0436  . sodium chloride    . sodium chloride       LOS: 1 day   Roniel Halloran, MD FACP Hospitalist.   If 7PM-7AM, please contact night-coverage [email or web address] Password [email or web address] 12/11/2016, 9:14 AM

## 2016-12-11 NOTE — Consult Note (Signed)
WOC consulted for foot wounds. Patient was evaluated via review of images and records in late June 2018 but this [facility] nurse. Patient is under the care of Dr. Sharol Given for possible amputation this month. It is unclear if the patient has followed up with orthopedics yet.  No family in his room and patient is not able to tell nurse. Discussed care with WTA (wound treatment associate) at the bedside. Wounds are 100% clean now and have no depth. Continues to have left heel wound 8cm x 8xm and right lateral foot wound 4cm x 2cm.  Since the wounds are 100% clean now I will implement saline moist gauze dressings two times per day.  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  ? osteomyelitis left calcaneus. Currently being treated for this with IV Van/Cefepime    Patient will need to keep scheduled follow up with Dr. Sharol Given.   Discussed POC with bedside nurse.  Re consult if needed, will not follow at this time. Thanks  Lunette Tapp [employer], RN,CWOCN, CNS, [place] [phone])

## 2016-12-11 NOTE — Progress Notes (Signed)
Subjective: Interval History: none.  Objective: Vital signs in last 24 hours: Temp:  [96.8 F (36 C)-99.2 F (37.3 C)] 98.5 F (36.9 C) (07/10 0400) Pulse Rate:  [103-117] 107 (07/10 0100) Resp:  [16-36] 17 (07/10 0100) BP: (112-167)/(63-147) 157/80 (07/10 0100) SpO2:  [93 %-98 %] 93 % (07/10 0000) Weight:  [115.5 kg (254 lb 10.1 oz)-115.9 kg (255 lb 8.2 oz)] 115.9 kg (255 lb 8.2 oz) (07/10 0500) Weight change:   Intake/Output from previous day: 07/09 0701 - 07/10 0700 In: 99 [I.V.:99] Out: 200 [Urine:200] Intake/Output this shift: No intake/output data recorded.  Patient remains very somnolent but arousable. He still doesn't comunicate. Unable to get any response from the patient Chest: Decreased breath sound bilaterally, no rales or rhonchi Heart exam regular rate and rhythm Extremities no edema  Lab Results:  Recent Labs  12/10/16 1150 12/11/16 0434  WBC 18.0* 14.9*  HGB 12.5* 11.8*  HCT 35.9* 33.8*  PLT 308 290   BMET:  Recent Labs  12/10/16 1851 12/11/16 0434  NA 139 140  K 3.8 3.3*  CL 112* 113*  CO2 12* 15*  GLUCOSE 192* 163*  BUN 90* 72*  CREATININE 8.55* 6.98*  CALCIUM 7.9* 7.7*   No results for input(s): PTH in the last 72 hours. Iron Studies: No results for input(s): IRON, TIBC, TRANSFERRIN, FERRITIN in the last 72 hours.  Studies/Results: Ct Head Wo Contrast  Result Date: 12/10/2016 CLINICAL DATA:  Fall yesterday.  Lethargic EXAM: CT HEAD WITHOUT CONTRAST TECHNIQUE: Contiguous axial images were obtained from the base of the skull through the vertex without intravenous contrast. COMPARISON:  CT 12/09/2016 FINDINGS: Brain: Mild atrophy unchanged. Negative for hydrocephalus. Chronic ischemic changes in the white matter including the left internal and external capsule. Negative for acute infarct, hemorrhage, or mass lesion. No fluid collection Vascular: Negative for hyperdense vessel Skull: Negative Sinuses/Orbits: Mild mucosal edema right maxillary  sinus and left ethmoid sinus. Negative orbit. Other: None IMPRESSION: No acute intracranial abnormality.  Stable since the recent study. Electronically Signed   By: Franchot Gallo M.D.   On: 12/10/2016 13:31   Ct Head Wo Contrast  Result Date: 12/09/2016 CLINICAL DATA:  Pain following fall EXAM: CT HEAD WITHOUT CONTRAST CT CERVICAL SPINE WITHOUT CONTRAST TECHNIQUE: Multidetector CT imaging of the head and cervical spine was performed following the standard protocol without intravenous contrast. Multiplanar CT image reconstructions of the cervical spine were also generated. COMPARISON:  Head CT November 08, 2016 FINDINGS: CT HEAD FINDINGS Brain: Mild diffuse atrophy is stable. There is no intracranial mass, hemorrhage, extra-axial fluid collection, or midline shift there is patchy small vessel disease in the centra semiovale bilaterally. There is evidence of a prior infarct involving much of the left external capsule, primarily anteriorly. There is a prior infarct in the lateral left thalamus. There is no new gray-white compartment lesion. No acute infarct is evident. Vascular: No hyperdense vessel. There is calcification in each carotid siphon region. Skull: Bony calvarium appears intact. Sinuses/Orbits: There is mucosal thickening with retention cyst in the posterior right maxillary antrum. There is opacification at several sites in left ethmoid region with mucosal thickening in multiple ethmoid air cells bilaterally. Other visualized paranasal sinuses are clear. Orbits appear symmetric bilaterally. Other: Mastoid air cells are clear. CT CERVICAL SPINE FINDINGS Alignment: There is no spondylolisthesis. Skull base and vertebrae: The skull base and craniocervical junction regions appear normal. There is no evident fracture. There is erosive change in the odontoid. There are no blastic or lytic  bone lesions. Soft tissues and spinal canal: Prevertebral soft tissues and predental space regions are normal. There are no  paraspinous lesions. No cord or canal hematoma evident. Disc levels: There is moderate disc space narrowing at C6-7. There is facet hypertrophy at several levels. No nerve root edema or effacement. No disc extrusion or stenosis. Upper chest: Visualized upper lung regions appear normal. Other: There is calcification in each proximal subclavian artery as well as in each carotid artery. IMPRESSION: CT head: Atrophy with periventricular small vessel disease. 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 No acute infarct. No intracranial mass, hemorrhage, or extra-axial fluid collection. Foci of arteriovascular calcification noted. Air areas of paranasal sinus disease noted. CT cervical spine: No fracture or spondylolisthesis. Erosive change in the odontoid without impending fracture evident. Areas of osteoarthritic change. No disc extrusion or stenosis. Multiple foci of vascular calcification including foci of calcification in each carotid artery. Electronically Signed   By: Lowella Grip III M.D.   On: 12/09/2016 18:13   Ct Cervical Spine Wo Contrast  Result Date: 12/09/2016 CLINICAL DATA:  Pain following fall EXAM: CT HEAD WITHOUT CONTRAST CT CERVICAL SPINE WITHOUT CONTRAST TECHNIQUE: Multidetector CT imaging of the head and cervical spine was performed following the standard protocol without intravenous contrast. Multiplanar CT image reconstructions of the cervical spine were also generated. COMPARISON:  Head CT November 08, 2016 FINDINGS: CT HEAD FINDINGS Brain: Mild diffuse atrophy is stable. There is no intracranial mass, hemorrhage, extra-axial fluid collection, or midline shift there is patchy small vessel disease in the centra semiovale bilaterally. There is evidence of a prior infarct involving much of the left external capsule, primarily anteriorly. There is a prior infarct in the lateral left thalamus. There is no new gray-white compartment lesion. No acute infarct is  evident. Vascular: No hyperdense vessel. There is calcification in each carotid siphon region. Skull: Bony calvarium appears intact. Sinuses/Orbits: There is mucosal thickening with retention cyst in the posterior right maxillary antrum. There is opacification at several sites in left ethmoid region with mucosal thickening in multiple ethmoid air cells bilaterally. Other visualized paranasal sinuses are clear. Orbits appear symmetric bilaterally. Other: Mastoid air cells are clear. CT CERVICAL SPINE FINDINGS Alignment: There is no spondylolisthesis. Skull base and vertebrae: The skull base and craniocervical junction regions appear normal. There is no evident fracture. There is erosive change in the odontoid. There are no blastic or lytic bone lesions. Soft tissues and spinal canal: Prevertebral soft tissues and predental space regions are normal. There are no paraspinous lesions. No cord or canal hematoma evident. Disc levels: There is moderate disc space narrowing at C6-7. There is facet hypertrophy at several levels. No nerve root edema or effacement. No disc extrusion or stenosis. Upper chest: Visualized upper lung regions appear normal. Other: There is calcification in each proximal subclavian artery as well as in each carotid artery. IMPRESSION: CT head: Atrophy with periventricular small vessel disease. 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 No acute infarct. No intracranial mass, hemorrhage, or extra-axial fluid collection. Foci of arteriovascular calcification noted. Air areas of paranasal sinus disease noted. CT cervical spine: No fracture or spondylolisthesis. Erosive change in the odontoid without impending fracture evident. Areas of osteoarthritic change. No disc extrusion or stenosis. Multiple foci of vascular calcification including foci of calcification in each carotid artery. Electronically Signed   By: Lowella Grip III M.D.   On: 12/09/2016 18:13   US  Abdomen Complete  Result Date: 12/10/2016 CLINICAL DATA:  66 y/o M; increased liver function tests, gallstones, altered mental status. EXAM: ABDOMEN ULTRASOUND COMPLETE COMPARISON:  11/24/2016 abdominal ultrasound. 11/23/2016 CT of the abdomen and pelvis. FINDINGS: Gallbladder: No gallbladder wall thickening or pericholecystic fluid and negative sonographic Murphy's sign. Gallstones measuring up to 2.7 cm. Some additional stones and sludge adherent to the wall of the gallbladder. Common bile duct: Diameter: 6 mm Liver: No focal lesion identified. Mildly increased echogenicity of the liver parenchyma. Normal direction of flow in main portal vein. IVC: No abnormality visualized. Pancreas: Obscured by bowel gas. Spleen: Size and appearance within normal limits. Right Kidney: Length: 11.2 cm. Increased renal echogenicity. No hydronephrosis or mass identified. Left Kidney: Length: 13.6 cm. Increase renal echogenicity. Simple appearing cyst measuring up to 2.3 cm within the upper pole. Abdominal aorta: Largely obscured by bowel gas. Other findings: None. IMPRESSION: 1. Gallstones and sludge, some adherent to the wall of gallbladder. No secondary signs of acute cholecystitis. 2. Mildly increased liver echogenicity, possible steatosis. 3. Increased renal echogenicity compatible with medical renal disease. Electronically Signed   By: Kristine Garbe M.D.   On: 12/10/2016 14:01   Dg Knee Complete 4 Views Left  Result Date: 12/09/2016 CLINICAL DATA:  Fall from bed.  Bilateral knee pain and erythema. EXAM: LEFT KNEE - COMPLETE 4+ VIEW COMPARISON:  01/07/2006 FINDINGS: Articular space narrowing, particularly in the medial compartment. Trace knee effusion.  Minimal marginal spurring along the patella. IMPRESSION: 1. Trace knee effusion. 2. Mild osteoarthritis. Electronically Signed   By: Van Clines M.D.   On: 12/09/2016 17:47   Dg Knee Complete 4 Views Right  Result Date: 12/09/2016 CLINICAL DATA:  Fall  from bed, knee pain and erythema. EXAM: RIGHT KNEE - COMPLETE 4+ VIEW COMPARISON:  03/04/2015 FINDINGS: Motion artifact on several images. No fracture or overt knee effusion. Suspected mild prepatellar subcutaneous edema. Medial compartmental articular space narrowing. IMPRESSION: 1. Degenerative chondral thinning in the medial compartment. 2. Prepatellar subcutaneous edema. 3. Mildly reduced sensitivity due to motion artifact. Electronically Signed   By: Van Clines M.D.   On: 12/09/2016 17:50   Dg Abd Acute W/chest  Result Date: 12/10/2016 CLINICAL DATA:  66 year old male with Celsius difficile. Subsequent encounter. EXAM: DG ABDOMEN ACUTE W/ 1V CHEST COMPARISON:  11/23/2016 CT abdomen pelvis and chest x-ray. FINDINGS: Cardiomegaly. Pulmonary vascular prominence. PICC line tip caval distal superior vena cava/ atrial junction level. Calcified aorta with slight tortuosity. Gas-filled stomach. No plain film evidence of bowel obstruction or free intraperitoneal air. IMPRESSION: No plain film evidence of bowel obstruction or free intraperitoneal air. Cardiomegaly. Pulmonary vascular prominence. Calcified slightly tortuous aorta. Electronically Signed   By: Genia Del M.D.   On: 12/10/2016 11:32    I have reviewed the patient's current medications.  Assessment/Plan: Problem #1 altered mental status: Etiology as this moment is not clear. Possibly recommendation of medications and uremia. Patient was dialyzed for 2 hours but no significant change. Presently patient is oliguric. Problem #2 acute kidney injury: His BUN and creatinine remains high. Possibly prerenal/ATN/AIN/ACE. Presently being hydrated. As stated above sternal function did show any significant improvement. Problem #3 low CO2 possibly metabolic Problem #4 leukocytosis: Improving. Patient has a wound and also history of C. difficile colitis. Problem #5 hypertension: His blood pressure is reasonably controlled Problem #6 Bone and  mineral disorder: His calcium is in range but phosphorus is high. His phosphorus however has improved Problem #7 history of diabetes Problem #8 history of CVA Plan: 1]  We'll continue his hydration 2] will dialyze patient for 21/2 hours today 3] will hold heparin and fluid removal 4] we'll check CBC and renal panel in the morning   LOS: 1 day   Mardene Lessig S 12/11/2016,7:36 AM

## 2016-12-11 NOTE — Progress Notes (Signed)
Pharmacy Antibiotic Note  Eric Scott is a 66 y.o. male admitted on 12/10/2016 with Altered mental status.  In AKI with hypothermia. Marland Kitchen  Pharmacy has been consulted for Daptomycin and Cefepime dosing (continued from [place]).  Acute renal failure noted, hemodialysis completed on 7/9 and planned again for today.  Presently patient is oliguric.  Plan: Cefepime 2gm IV x 1 after HD. Daptomycin 800mg  IV x 1 after HD today. Subsequent doses based on renal function and/or HD plans. Monitor labs, micro and vitals.   Height: 6' (182.9 cm) Weight: 255 lb 8.2 oz (115.9 kg) IBW/kg (Calculated) : 77.6  Temp (24hrs), Avg:98.3 F (36.8 C), Min:96.8 F (36 C), Max:99.2 F (37.3 C)   Recent Labs Lab 12/10/16 1150 12/10/16 1214 12/10/16 1416 12/10/16 1851 12/11/16 0434  WBC 18.0*  --   --   --  14.9*  CREATININE 8.37*  --   --  8.55* 6.98*  LATICACIDVEN  --  1.56 0.80  --   --     Estimated Creatinine Clearance: 13.7 mL/min (A) (by C-G formula based on SCr of 6.98 mg/dL (H)).    Allergies  Allergen Reactions  . Blueberry Flavor   . Cucumber Extract Other (See Comments)    "Fells like I'm having a heart attack"  . Flexeril [Cyclobenzaprine Hcl] Other (See Comments)    "whole body  Tremors" (05/27/2012)  . Kiwi Extract Other (See Comments)    "feels like I'm having a heart attack"    Antimicrobials this admission:  Dapto 6/24 >>  Cefepime 6/22 >>  Oral Vanc 6/26 >> Dose adjustments this admission:  Adjusted AKI  Microbiology results:  7/9 BCx: pending 7/9 UCx: pending   Sputum:    MRSA PCR:    Thank you for allowing pharmacy to be a part of this patient's care.  Pricilla Larsson 12/11/2016 10:29 AM

## 2016-12-11 NOTE — Procedures (Signed)
   HEMODIALYSIS TREATMENT NOTE:  2.5 hour heparin-free dialysis completed via right femoral non-tunneled catheter. Exit site unremarkable. No ultrafiltration per nephrologist's order.  Hemodynamically stable throughout HD session.  All blood was returned.  Catheter tolerated flow with stable venous and arterial pressures after a 4-hour Alteplase dwell.    Rockwell Alexandria, RN, CDN

## 2016-12-12 ENCOUNTER — Inpatient Hospital Stay (HOSPITAL_COMMUNITY): Payer: Medicare Other

## 2016-12-12 ENCOUNTER — Ambulatory Visit (INDEPENDENT_AMBULATORY_CARE_PROVIDER_SITE_OTHER): Payer: Medicare Other | Admitting: Orthopedic Surgery

## 2016-12-12 DIAGNOSIS — I340 Nonrheumatic mitral (valve) insufficiency: Secondary | ICD-10-CM

## 2016-12-12 DIAGNOSIS — G9341 Metabolic encephalopathy: Secondary | ICD-10-CM

## 2016-12-12 LAB — RENAL FUNCTION PANEL
ALBUMIN: 2 g/dL — AB (ref 3.5–5.0)
Anion gap: 11 (ref 5–15)
BUN: 54 mg/dL — ABNORMAL HIGH (ref 6–20)
CHLORIDE: 107 mmol/L (ref 101–111)
CO2: 21 mmol/L — ABNORMAL LOW (ref 22–32)
CREATININE: 5.59 mg/dL — AB (ref 0.61–1.24)
Calcium: 7.7 mg/dL — ABNORMAL LOW (ref 8.9–10.3)
GFR, EST AFRICAN AMERICAN: 11 mL/min — AB (ref >60)
GFR, EST NON AFRICAN AMERICAN: 10 mL/min — AB (ref >60)
Glucose, Bld: 225 mg/dL — ABNORMAL HIGH (ref 65–99)
PHOSPHORUS: 4.7 mg/dL — AB (ref 2.5–4.6)
Potassium: 2.9 mmol/L — ABNORMAL LOW (ref 3.5–5.1)
Sodium: 139 mmol/L (ref 135–145)

## 2016-12-12 LAB — CBC
HCT: 32.8 % — ABNORMAL LOW (ref 39.0–52.0)
Hemoglobin: 11.5 g/dL — ABNORMAL LOW (ref 13.0–17.0)
MCH: 32.9 pg (ref 26.0–34.0)
MCHC: 35.1 g/dL (ref 30.0–36.0)
MCV: 93.7 fL (ref 78.0–100.0)
PLATELETS: 273 10*3/uL (ref 150–400)
RBC: 3.5 MIL/uL — AB (ref 4.22–5.81)
RDW: 15.7 % — AB (ref 11.5–15.5)
WBC: 12.5 10*3/uL — AB (ref 4.0–10.5)

## 2016-12-12 LAB — URINE CULTURE

## 2016-12-12 LAB — GLUCOSE, CAPILLARY
GLUCOSE-CAPILLARY: 200 mg/dL — AB (ref 65–99)
GLUCOSE-CAPILLARY: 223 mg/dL — AB (ref 65–99)
GLUCOSE-CAPILLARY: 220 mg/dL — AB (ref 65–99)
GLUCOSE-CAPILLARY: 173 mg/dL — AB (ref 65–99)
GLUCOSE-CAPILLARY: 190 mg/dL — AB (ref 65–99)

## 2016-12-12 LAB — CK: CK TOTAL: 314 U/L (ref 49–397)

## 2016-12-12 LAB — ECHOCARDIOGRAM COMPLETE
HEIGHTINCHES: 72 in
Weight: 4112.9 oz

## 2016-12-12 MED ORDER — NEPRO/CARBSTEADY PO LIQD
237.0000 mL | Freq: Two times a day (BID) | ORAL | Status: DC
  Administered 2016-12-13 – 2016-12-18 (×10): 237 mL via ORAL

## 2016-12-12 MED ORDER — COLLAGENASE 250 UNIT/GM EX OINT
TOPICAL_OINTMENT | Freq: Every day | CUTANEOUS | Status: DC
  Administered 2016-12-12 – 2016-12-24 (×13): via TOPICAL
  Filled 2016-12-12 (×2): qty 30

## 2016-12-12 MED ORDER — POTASSIUM CHLORIDE 10 MEQ/100ML IV SOLN
10.0000 meq | INTRAVENOUS | Status: AC
  Administered 2016-12-12 (×4): 10 meq via INTRAVENOUS
  Filled 2016-12-12 (×2): qty 100

## 2016-12-12 MED ORDER — METOPROLOL TARTRATE 5 MG/5ML IV SOLN
5.0000 mg | Freq: Four times a day (QID) | INTRAVENOUS | Status: DC
  Administered 2016-12-12 – 2016-12-15 (×13): 5 mg via INTRAVENOUS
  Filled 2016-12-12 (×14): qty 5

## 2016-12-12 MED ORDER — FLUTICASONE FUROATE-VILANTEROL 100-25 MCG/INH IN AEPB
1.0000 | INHALATION_SPRAY | Freq: Every day | RESPIRATORY_TRACT | Status: DC
  Administered 2016-12-12 – 2016-12-24 (×13): 1 via RESPIRATORY_TRACT
  Filled 2016-12-12: qty 28

## 2016-12-12 MED ORDER — SODIUM CHLORIDE 0.9% FLUSH: 10.0000 mL | INTRAVENOUS | Status: DC | PRN

## 2016-12-12 MED ORDER — SODIUM CHLORIDE 0.9% FLUSH
10.0000 mL | Freq: Two times a day (BID) | INTRAVENOUS | Status: DC
  Administered 2016-12-12 – 2016-12-23 (×17): 10 mL

## 2016-12-12 MED ORDER — CHLORHEXIDINE GLUCONATE CLOTH 2 % EX PADS
6.0000 | MEDICATED_PAD | Freq: Every day | CUTANEOUS | Status: DC
  Administered 2016-12-12 – 2016-12-18 (×7): 6 via TOPICAL

## 2016-12-12 NOTE — Progress Notes (Signed)
Patient went into ST / SVT for approximately 5 minutes in the 140s-170's. Obtained EKG for rate change but patient rate went back to 100s-110s. EKG showed ST with PAC's. Made MD aware, will continue to monitor

## 2016-12-12 NOTE — Progress Notes (Signed)
PROGRESS NOTE    Eric Scott  FTD:[id number] DOB: [birth year] DOA: 12/10/2016 PCP: [name], MD    Brief Narrative:  66 year old male with history of chronic kidney disease, osteomyelitis on IV antibiotics, presented with alteration in mental status and was found to be unresponsive. In the ER, he was noted to have acute kidney injury and was hypothermic. He was admitted to the hospital and underwent dialysis. He is continued on antibiotics for osteomyelitis as well as recent diagnosis of C. difficile. Overall mental status is slowly improving.   Assessment & Plan:   Principal Problem:   Uremia Active Problems:   COPD with exacerbation ([name])   AKI (acute kidney injury) ([place])   Osteomyelitis ([name])   Volume depletion   Hypotension   1. Acute encephalopathy. Suspect this is related to medication effects in the setting of renal failure as well as uremia. Mental status appears to be slowly improving. Sedatives are currently on hold. 2. Acute kidney injury. Started on dialysis per nephrology. Renal function is improving with dialysis. He still appears to be oliguric. Check CK since the patient is on daptomycin. 3. Chronic osteomyelitis of the feet bilaterally. Currently on IV cefepime and daptomycin. Patient is followed by Dr. Sharol Given. Review of records indicate that Dr. Sharol Given had recommended that patient undergo right foot fifth ray amputation as well as left transtibial amputation. Patient wanted to wait and continue antibiotics for now. He was advised to return to see orthopedics in 3 weeks. Unfortunately he is currently in the hospital will need to follow-up with orthopedics after discharge. 4. C. difficile. Antigen positive, toxin negative, PCR positive. Currently on oral vancomycin. 5. Sinus tachycardia. Started on low-dose beta blocker. 6. Metabolic acidosis. Related to renal failure. Currently on bicarbonate infusion. Serum bicarbonate has improved.   DVT prophylaxis:  heparin Code Status: full code Family Communication: no family present Disposition Plan: Discharge to SNF when improved   Consultants:   Nephrology  General surgery for dialysis catheter placement  Procedures:     Antimicrobials:   daptomycin   Cefepime  Oral vancomycin   Subjective: Responds to voice but does not answer questions  Objective: Vitals:   12/12/16 0300 12/12/16 0400 12/12/16 0500 12/12/16 0719  BP: 116/76     Pulse: (!) 104   (!) 106  Resp: 16   (!) 21  Temp:  98.8 F (37.1 C)  99 F (37.2 C)  TempSrc:  Oral  Axillary  SpO2: 96%   96%  Weight:   116.6 kg (257 lb 0.9 oz)   Height:        Intake/Output Summary (Last 24 hours) at 12/12/16 1032 Last data filed at 12/12/16 0500  Gross per 24 hour  Intake             1600 ml  Output              200 ml  Net             1400 ml   Filed Weights   12/11/16 0500 12/11/16 1745 12/12/16 0500  Weight: 115.9 kg (255 lb 8.2 oz) 116 kg (255 lb 11.7 oz) 116.6 kg (257 lb 0.9 oz)    Examination:  General exam: Appears calm and comfortable  Respiratory system: Clear to auscultation. Respiratory effort normal. Cardiovascular system: S1 & S2 heard, RRR. No JVD, murmurs, rubs, gallops or clicks. No pedal edema. Gastrointestinal system: Abdomen is nondistended, soft and nontender. No organomegaly or masses felt. Normal bowel sounds  heard. Central nervous system: No focal neurological deficits. Extremities: Symmetric 5 x 5 power. Skin: No rashes, lesions or ulcers Psychiatry: confused    Data Reviewed: I have personally reviewed following labs and imaging studies  CBC:  Recent Labs Lab 12/10/16 1150 12/11/16 0434 12/12/16 0422  WBC 18.0* 14.9* 12.5*  NEUTROABS 16.5*  --   --   HGB 12.5* 11.8* 11.5*  HCT 35.9* 33.8* 32.8*  MCV 94.7 94.2 93.7  PLT 308 290 497   Basic Metabolic Panel:  Recent Labs Lab 12/10/16 1150 12/10/16 1851 12/11/16 0434 12/12/16 0422  NA 137 139 140 139  K 3.8 3.8  3.3* 2.9*  CL 110 112* 113* 107  CO2 12* 12* 15* 21*  GLUCOSE 231* 192* 163* 225*  BUN 87* 90* 72* 54*  CREATININE 8.37* 8.55* 6.98* 5.59*  CALCIUM 8.0* 7.9* 7.7* 7.7*  PHOS  --  8.4* 6.2* 4.7*   GFR: Estimated Creatinine Clearance: 17.1 mL/min (A) (by C-G formula based on SCr of 5.59 mg/dL (H)). Liver Function Tests:  Recent Labs Lab 12/10/16 1150 12/10/16 1851 12/11/16 0434 12/12/16 0422  AST 243*  --   --   --   ALT 226*  --   --   --   ALKPHOS 102  --   --   --   BILITOT 0.6  --   --   --   PROT 6.8  --   --   --   ALBUMIN 2.2* 2.0* 2.1* 2.0*   No results for input(s): LIPASE, AMYLASE in the last 168 hours.  Recent Labs Lab 12/10/16 1251  AMMONIA 25   Coagulation Profile:  Recent Labs Lab 12/10/16 1150  INR 1.50   Cardiac Enzymes:  Recent Labs Lab 12/10/16 1056  TROPONINI 0.04*   BNP (last 3 results) No results for input(s): PROBNP in the last 8760 hours. HbA1C: No results for input(s): HGBA1C in the last 72 hours. CBG:  Recent Labs Lab 12/11/16 1130 12/11/16 1556 12/11/16 1936 12/12/16 0333 12/12/16 0717  GLUCAP 195* 202* 151* 200* 223*   Lipid Profile: No results for input(s): CHOL, HDL, LDLCALC, TRIG, CHOLHDL, LDLDIRECT in the last 72 hours. Thyroid Function Tests:  Recent Labs  12/11/16 1108  TSH 1.480   Anemia Panel: No results for input(s): VITAMINB12, FOLATE, FERRITIN, TIBC, IRON, RETICCTPCT in the last 72 hours. Sepsis Labs:  Recent Labs Lab 12/10/16 1214 12/10/16 1416  LATICACIDVEN 1.56 0.80    Recent Results (from the past 240 hour(s))  Urine culture     Status: Abnormal   Collection Time: 12/10/16 10:55 AM  Result Value Ref Range Status   Specimen Description URINE, CLEAN CATCH  Final   Special Requests NONE  Final   Culture (A)  Final    <10,000 COLONIES/mL INSIGNIFICANT GROWTH Performed at [facility], [address], [place], [place] [address]    Report Status 12/12/2016 FINAL  Final  Culture, blood  (Routine x 2)     Status: None (Preliminary result)   Collection Time: 12/10/16 12:42 PM  Result Value Ref Range Status   Specimen Description BLOOD PORT DRAW  Final   Special Requests Blood Culture adequate volume  Final   Culture NO GROWTH 2 DAYS  Final   Report Status PENDING  Incomplete  Culture, blood (Routine x 2)     Status: None (Preliminary result)   Collection Time: 12/10/16 12:51 PM  Result Value Ref Range Status   Specimen Description BLOOD RIGHT ARM  Final  Special Requests   Final    BOTTLES DRAWN AEROBIC AND ANAEROBIC Blood Culture adequate volume   Culture NO GROWTH 2 DAYS  Final   Report Status PENDING  Incomplete         Radiology Studies: Ct Head Wo Contrast  Result Date: 12/10/2016 CLINICAL DATA:  Fall yesterday.  Lethargic EXAM: CT HEAD WITHOUT CONTRAST TECHNIQUE: Contiguous axial images were obtained from the base of the skull through the vertex without intravenous contrast. COMPARISON:  CT 12/09/2016 FINDINGS: Brain: Mild atrophy unchanged. Negative for hydrocephalus. Chronic ischemic changes in the white matter including the left internal and external capsule. Negative for acute infarct, hemorrhage, or mass lesion. No fluid collection Vascular: Negative for hyperdense vessel Skull: Negative Sinuses/Orbits: Mild mucosal edema right maxillary sinus and left ethmoid sinus. Negative orbit. Other: None IMPRESSION: No acute intracranial abnormality.  Stable since the recent study. Electronically Signed   By: Franchot Gallo M.D.   On: 12/10/2016 13:31   US Abdomen Complete  Result Date: 12/10/2016 CLINICAL DATA:  66 y/o M; increased liver function tests, gallstones, altered mental status. EXAM: ABDOMEN ULTRASOUND COMPLETE COMPARISON:  11/24/2016 abdominal ultrasound. 11/23/2016 CT of the abdomen and pelvis. FINDINGS: Gallbladder: No gallbladder wall thickening or pericholecystic fluid and negative sonographic Murphy's sign. Gallstones measuring up to 2.7 cm. Some  additional stones and sludge adherent to the wall of the gallbladder. Common bile duct: Diameter: 6 mm Liver: No focal lesion identified. Mildly increased echogenicity of the liver parenchyma. Normal direction of flow in main portal vein. IVC: No abnormality visualized. Pancreas: Obscured by bowel gas. Spleen: Size and appearance within normal limits. Right Kidney: Length: 11.2 cm. Increased renal echogenicity. No hydronephrosis or mass identified. Left Kidney: Length: 13.6 cm. Increase renal echogenicity. Simple appearing cyst measuring up to 2.3 cm within the upper pole. Abdominal aorta: Largely obscured by bowel gas. Other findings: None. IMPRESSION: 1. Gallstones and sludge, some adherent to the wall of gallbladder. No secondary signs of acute cholecystitis. 2. Mildly increased liver echogenicity, possible steatosis. 3. Increased renal echogenicity compatible with medical renal disease. Electronically Signed   By: Kristine Garbe M.D.   On: 12/10/2016 14:01   Dg Abd Acute W/chest  Result Date: 12/10/2016 CLINICAL DATA:  66 year old male with Celsius difficile. Subsequent encounter. EXAM: DG ABDOMEN ACUTE W/ 1V CHEST COMPARISON:  11/23/2016 CT abdomen pelvis and chest x-ray. FINDINGS: Cardiomegaly. Pulmonary vascular prominence. PICC line tip caval distal superior vena cava/ atrial junction level. Calcified aorta with slight tortuosity. Gas-filled stomach. No plain film evidence of bowel obstruction or free intraperitoneal air. IMPRESSION: No plain film evidence of bowel obstruction or free intraperitoneal air. Cardiomegaly. Pulmonary vascular prominence. Calcified slightly tortuous aorta. Electronically Signed   By: Genia Del M.D.   On: 12/10/2016 11:32        Scheduled Meds: . Chlorhexidine Gluconate Cloth  6 each Topical Daily  . heparin  5,000 Units Subcutaneous Q8H  . insulin aspart  0-9 Units Subcutaneous Q4H  . metoprolol tartrate  5 mg Intravenous Q6H  . mupirocin ointment  1  application Nasal BID  . sodium chloride flush  10-40 mL Intracatheter Q12H  . vancomycin  500 mg Oral Q6H   Continuous Infusions: . sodium chloride    . sodium chloride    . sodium chloride    . sodium chloride    . dextrose 5 % and 0.45% NaCl 1,000 mL with sodium bicarbonate 50 mEq infusion 100 mL/hr at 12/12/16 1006  . potassium chloride  LOS: 2 days    Time spent: 56mins    Kalika Smay, MD [facility] Pager [phone]  If 7PM-7AM, please contact night-coverage [email or web address] Password [facility] 12/12/2016, 10:32 AM

## 2016-12-12 NOTE — Progress Notes (Addendum)
Eric Scott  MRN: [id number]  DOB/AGE: [birth year] 66 y.o.  Primary Care Physician:Hasanaj, Samul Dada, MD  Admit date: 12/10/2016  Chief Complaint:  Chief Complaint  Patient presents with  . Weakness    S-Pt presented on  12/10/2016 with  Chief Complaint  Patient presents with  . Weakness  .    Pt unable to offer any complaints.    Pt does responds minimally.     Pt son present in the room, he asked relevant questions about dialysis.     I tried to explain to best of my ability.    Meds . Chlorhexidine Gluconate Cloth  6 each Topical Q0600  . heparin  5,000 Units Subcutaneous Q8H  . insulin aspart  0-9 Units Subcutaneous Q4H  . mupirocin ointment  1 application Nasal BID  . vancomycin  500 mg Oral Q6H         Physical Exam: Vital signs in last 24 hours: Temp:  [97.9 F (36.6 C)-99.1 F (37.3 C)] 98.8 F (37.1 C) (07/11 0400) Pulse Rate:  [103-116] 104 (07/11 0300) Resp:  [15-20] 16 (07/11 0300) BP: (115-154)/(66-89) 116/76 (07/11 0300) SpO2:  [93 %-96 %] 96 % (07/11 0300) Weight:  [255 lb 11.7 oz (116 kg)-257 lb 0.9 oz (116.6 kg)] 257 lb 0.9 oz (116.6 kg) (07/11 0500) Weight change: 14.1 oz (0.4 kg) Last BM Date: 12/11/16  Intake/Output from previous day: 07/10 0701 - 07/11 0700 In: 1600 [I.V.:1600] Out: 200 [Urine:200] Total I/O In: 1600 [I.V.:1600] Out: 200 [Urine:200]   Physical Exam: General- pt is awake,follows commands intermittently  Resp- No acute REsp distress,NO Rhonchi CVS- S1S2 regular in rate and rhythm GIT- BS+, soft, NT, ND EXT- NO LE Edema, Cyanosis   Lab Results: CBC  Recent Labs  12/11/16 0434 12/12/16 0422  WBC 14.9* 12.5*  HGB 11.8* 11.5*  HCT 33.8* 32.8*  PLT 290 273    BMET  Recent Labs  12/11/16 0434 12/12/16 0422  NA 140 139  K 3.3* 2.9*  CL 113* 107  CO2 15* 21*  GLUCOSE 163* 225*  BUN 72* 54*  CREATININE 6.98* 5.59*  CALCIUM 7.7* 7.7*   Creat trend 2018   In July 8.55==> 5.5( After HD)  In June  1.5--4.0 In May 1.0--1.5 2016 1.2--1.5    MICRO Recent Results (from the past 240 hour(s))  Culture, blood (Routine x 2)     Status: None (Preliminary result)   Collection Time: 12/10/16 12:42 PM  Result Value Ref Range Status   Specimen Description BLOOD PORT DRAW  Final   Special Requests Blood Culture adequate volume  Final   Culture NO GROWTH 1 DAY  Final   Report Status PENDING  Incomplete  Culture, blood (Routine x 2)     Status: None (Preliminary result)   Collection Time: 12/10/16 12:51 PM  Result Value Ref Range Status   Specimen Description BLOOD RIGHT ARM  Final   Special Requests   Final    BOTTLES DRAWN AEROBIC AND ANAEROBIC Blood Culture adequate volume   Culture NO GROWTH 1 DAY  Final   Report Status PENDING  Incomplete      Lab Results  Component Value Date   CALCIUM 7.7 (L) 12/12/2016   PHOS 4.7 (H) 12/12/2016               Impression: 1)Renal  AKI secondary to ATN/AIN                AKI on CKD  CKD sec to DM/ Multiple AKi                               Pt required renal replacement therapy sec to High BUN/ Mental status changes                 Oliguruc ATN                   Urine output still low                    2)HTN Bp stable  3)Anemia HGb stable  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   5)ID- hx of recent C diff Primary MD following  6)Electrolytes Hypokalemic NOrmonatremic   7)Acid base Co2 at goal  8) CNS- admitted with AMS Etiolology Uremia? Medications   Plan:  Will continue current tx Will most likely dialyze in am if urine output still low       BHUTANI,MANPREET S 12/12/2016, 6:41 AM

## 2016-12-12 NOTE — Clinical Social Work Note (Signed)
Clinical Social Work Assessment  Patient Details  Name: Eric Scott MRN: [id number] Date of Birth: 12/06/1950  Date of referral:  12/12/16               Reason for consult:  Discharge Planning                Permission sought to share information with:    Permission granted to share information::     Name::        Agency::  [facility]   Relationship::     Contact Information:     Housing/Transportation Living arrangements for the past 2 months:  [place] of Information:  Facility Patient Interpreter Needed:  None Criminal Activity/Legal Involvement Pertinent to Current Situation/Hospitalization:  No - Comment as needed Significant Relationships:  Adult Children, Other Family Members Lives with:  Facility Resident Do you feel safe going back to the place where you live?  Yes Need for family participation in patient care:  Yes (Comment)  Care giving concerns:  None identified. Facility resident.    Social Worker assessment / plan:  Patient has been at [facility] for two years. He is independent in ADLs.  He uses a wheelchair and requires assistance with transfers.  Patient has a supportive former sister-in-law who visits often. He can return to the facility at discharge.   Employment status:  Disabled (Comment on whether or not currently receiving Disability) Insurance information:  Managed Medicare PT Recommendations:  Not assessed at this time Information / Referral to community resources:     Patient/Family's Response to care:  Messages left for family.    Patient/Family's Understanding of and Emotional Response to Diagnosis, Current Treatment, and Prognosis:  Messages left for family.   Emotional Assessment Appearance:  Appears stated age Attitude/Demeanor/Rapport:    Affect (typically observed):  Unable to Assess Orientation:    Alcohol / Substance use:  Not Applicable Psych involvement (Current and /or in the community):  No  (Comment)  Discharge Needs  Concerns to be addressed:  No discharge needs identified (Return to [facility]) Readmission within the last 30 days:  Yes Current discharge risk:  None Barriers to Discharge:  No Barriers Identified   Ihor Gully, LCSW 12/12/2016, 11:27 AM

## 2016-12-12 NOTE — Progress Notes (Signed)
Initial Nutrition Assessment   INTERVENTION:  Nepro Shake po BID - until po intake improves.   NUTRITION DIAGNOSIS:   Inadequate oral intake related to lethargy/confusion, acute illness as evidenced by meal completion < 25%, percent weight loss 8% in 1 month   GOAL:   Patient will meet greater than or equal to 90% of their needs   MONITOR:   Supplement acceptance, meal intake, labs and wt changes  REASON FOR ASSESSMENT:   Low Braden    ASSESSMENT:  Patient is a 66 yo male who presents unresponsive, volume depleted with urosepsis, metabolic acidosis. He has been living at [facility] for past 2 years.   The patient's eyes are open and he smiles when addressed but is not answering even simple yes / no questions. Discussed with his nurse who reports he is improved some compared to when he presented to ED. He had HD last night 2.5 hr and tolerated well per dialysis nurse note. His appetite is very poor today so far. His diet was advanced for lunch but would only take a couple of bites of food and sips of tea. Presently he is unable to feed himself unsure if that is his baseline. Will trial oral supplement with him to facilitate nutrition intake.  His weight is down significantly 8% in 1 month. Suspect has not been eating sufficiently given his volume depletion on admission and acutely compromised renal function. Limited physical exam completed. Mild muscle loss clavicle /deltoid regions and BLE edema upper and lower extremities.  Recent Labs Lab 12/10/16 1851 12/11/16 0434 12/12/16 0422  NA 139 140 139  K 3.8 3.3* 2.9*  CL 112* 113* 107  CO2 12* 15* 21*  BUN 90* 72* 54*  CREATININE 8.55* 6.98* 5.59*  CALCIUM 7.9* 7.7* 7.7*  PHOS 8.4* 6.2* 4.7*  GLUCOSE 192* 163* 225*  Labs: Potassium 2.9, BUN 54, Cr 5.59 improved from 8.55 on admission. Phos 4.7 has improved from 8.4.   Meds: insulin, vancomycin. IVF- D5-0.45 NS @ 100 ml/hr  CBG (last 3)   Recent Labs  12/12/16 0333  12/12/16 0717 12/12/16 1119  GLUCAP 200* 223* 220*  CBG's range 200-220 mg/dl.    Diet Order:  Diet heart healthy/carb modified Room service appropriate? Yes; Fluid consistency: Thin  Skin:   hx of chronic osteomyelitis bilaterally-unstageable wound right foot and has been followed by Dr Sharol Given for possible amputation per MD note.  Last BM:  7/10 liquid stool-200 ml output- rectal tube present - C. Diff antigen +  Height:   Ht Readings from Last 1 Encounters:  12/10/16 6' (1.829 m)    Weight:   Wt Readings from Last 1 Encounters:  12/12/16 257 lb 0.9 oz (116.6 kg)    Ideal Body Weight:  81 kg  BMI:  Body mass index is 34.86 kg/m.  Estimated Nutritional Needs:   Kcal:  2386  Protein:  94 gr  Fluid:  per MD goals  EDUCATION NEEDS:   No education needs identified at this time  Colman Cater MS,RD,CSG,LDN Office: #[phone] Pager: [phone]

## 2016-12-12 NOTE — Progress Notes (Signed)
Pt heart rate in the 160-170's approx. 4 mins, MD made aware, orders given.

## 2016-12-13 ENCOUNTER — Other Ambulatory Visit (HOSPITAL_COMMUNITY): Payer: Medicare Other

## 2016-12-13 LAB — GLUCOSE, CAPILLARY
GLUCOSE-CAPILLARY: 185 mg/dL — AB (ref 65–99)
GLUCOSE-CAPILLARY: 201 mg/dL — AB (ref 65–99)
Glucose-Capillary: 123 mg/dL — ABNORMAL HIGH (ref 65–99)
Glucose-Capillary: 159 mg/dL — ABNORMAL HIGH (ref 65–99)
Glucose-Capillary: 167 mg/dL — ABNORMAL HIGH (ref 65–99)
Glucose-Capillary: 195 mg/dL — ABNORMAL HIGH (ref 65–99)

## 2016-12-13 LAB — CK ISOENZYMES
CK MB: 2 % (ref 0–3)
CK MM: 81 % — AB (ref 97–100)
CK-BB: 0 %
CREATINE KINASE-TOTAL: 808 U/L — AB (ref 24–204)
MACRO TYPE 1: 17 % — AB
Macro Type 2: 0 %

## 2016-12-13 LAB — BASIC METABOLIC PANEL
ANION GAP: 10 (ref 5–15)
BUN: 65 mg/dL — ABNORMAL HIGH (ref 6–20)
CHLORIDE: 109 mmol/L (ref 101–111)
CO2: 22 mmol/L (ref 22–32)
Calcium: 7.8 mg/dL — ABNORMAL LOW (ref 8.9–10.3)
Creatinine, Ser: 6.17 mg/dL — ABNORMAL HIGH (ref 0.61–1.24)
GFR calc Af Amer: 10 mL/min — ABNORMAL LOW (ref >60)
GFR, EST NON AFRICAN AMERICAN: 8 mL/min — AB (ref >60)
GLUCOSE: 161 mg/dL — AB (ref 65–99)
POTASSIUM: 2.9 mmol/L — AB (ref 3.5–5.1)
Sodium: 141 mmol/L (ref 135–145)

## 2016-12-13 MED ORDER — DEXTROSE 5 % IV SOLN
2.0000 g | INTRAVENOUS | Status: DC
  Administered 2016-12-15: 2 g via INTRAVENOUS
  Filled 2016-12-13 (×2): qty 2

## 2016-12-13 MED ORDER — ORAL CARE MOUTH RINSE
15.0000 mL | Freq: Two times a day (BID) | OROMUCOSAL | Status: DC
  Administered 2016-12-15 – 2016-12-24 (×9): 15 mL via OROMUCOSAL

## 2016-12-13 MED ORDER — DEXTROSE 5 % IV SOLN
2.0000 g | INTRAVENOUS | Status: AC
  Administered 2016-12-13: 2 g via INTRAVENOUS
  Filled 2016-12-13: qty 2

## 2016-12-13 MED ORDER — CHLORHEXIDINE GLUCONATE 0.12 % MT SOLN
15.0000 mL | Freq: Two times a day (BID) | OROMUCOSAL | Status: DC
  Administered 2016-12-13 – 2016-12-24 (×22): 15 mL via OROMUCOSAL
  Filled 2016-12-13 (×21): qty 15

## 2016-12-13 MED ORDER — CHLORHEXIDINE GLUCONATE CLOTH 2 % EX PADS
6.0000 | MEDICATED_PAD | Freq: Every day | CUTANEOUS | Status: DC
  Administered 2016-12-13 – 2016-12-16 (×4): 6 via TOPICAL

## 2016-12-13 MED ORDER — POTASSIUM CHLORIDE 10 MEQ/100ML IV SOLN
10.0000 meq | INTRAVENOUS | Status: AC
  Administered 2016-12-13 (×4): 10 meq via INTRAVENOUS
  Filled 2016-12-13 (×4): qty 100

## 2016-12-13 MED ORDER — SODIUM CHLORIDE 0.9 % IV SOLN
800.0000 mg | INTRAVENOUS | Status: DC
  Administered 2016-12-13 – 2016-12-15 (×2): 800 mg via INTRAVENOUS
  Filled 2016-12-13 (×3): qty 16

## 2016-12-13 MED ORDER — HEPARIN SODIUM (PORCINE) 1000 UNIT/ML IJ SOLN
INTRAMUSCULAR | Status: AC
  Administered 2016-12-13: 1000 [IU] via INTRAVENOUS_CENTRAL
  Filled 2016-12-13: qty 1

## 2016-12-13 NOTE — Progress Notes (Signed)
Eric Scott  MRN: [id number]  DOB/AGE: 1950-08-02 66 y.o.  Primary Care Physician:Hasanaj, Samul Dada, MD  Admit date: 12/10/2016  Chief Complaint:  Chief Complaint  Patient presents with  . Weakness    S-Pt presented on  12/10/2016 with  Chief Complaint  Patient presents with  . Weakness  .    Pt unable to offer any complaints.    Pt does responds much better than yesterday    Meds . Chlorhexidine Gluconate Cloth  6 each Topical Daily  . Chlorhexidine Gluconate Cloth  6 each Topical Q0600  . collagenase   Topical Daily  . feeding supplement (NEPRO CARB STEADY)  237 mL Oral BID BM  . fluticasone furoate-vilanterol  1 puff Inhalation Daily  . heparin  5,000 Units Subcutaneous Q8H  . insulin aspart  0-9 Units Subcutaneous Q4H  . metoprolol tartrate  5 mg Intravenous Q6H  . mupirocin ointment  1 application Nasal BID  . sodium chloride flush  10-40 mL Intracatheter Q12H  . vancomycin  500 mg Oral Q6H         Physical Exam: Vital signs in last 24 hours: Temp:  [97.2 F (36.2 C)-99.1 F (37.3 C)] 97.6 F (36.4 C) (07/12 0725) Pulse Rate:  [74-98] 81 (07/12 0725) Resp:  [13-22] 13 (07/12 0725) BP: (124-144)/(53-76) 132/65 (07/12 0700) SpO2:  [92 %-96 %] 95 % (07/12 0725) Weight:  [256 lb 13.4 oz (116.5 kg)] 256 lb 13.4 oz (116.5 kg) (07/12 0458) Weight change: 1 lb 1.6 oz (0.5 kg) Last BM Date: 12/12/16  Intake/Output from previous day: 07/11 0701 - 07/12 0700 In: 1260 [P.O.:60; I.V.:1200] Out: 1100 [Urine:200; Stool:900] No intake/output data recorded.   Physical Exam: General- pt is awake,follows commands .  Resp- No acute REsp distress,NO Rhonchi CVS- S1S2 regular in rate and rhythm GIT- BS+, soft, NT, ND EXT- NO LE Edema, Cyanosis   Lab Results: CBC  Recent Labs  12/11/16 0434 12/12/16 0422  WBC 14.9* 12.5*  HGB 11.8* 11.5*  HCT 33.8* 32.8*  PLT 290 273    BMET  Recent Labs  12/12/16 0422 12/13/16 0415  NA 139 141  K 2.9* 2.9*  CL  107 109  CO2 21* 22  GLUCOSE 225* 161*  BUN 54* 65*  CREATININE 5.59* 6.17*  CALCIUM 7.7* 7.8*   Creat trend 2018   In July 8.55==> 5.5( After HD) ==> 6.1 In June 1.5--4.0 In May 1.0--1.5 2016 1.2--1.5    MICRO Recent Results (from the past 240 hour(s))  Urine culture     Status: Abnormal   Collection Time: 12/10/16 10:55 AM  Result Value Ref Range Status   Specimen Description URINE, CLEAN CATCH  Final   Special Requests NONE  Final   Culture (A)  Final    <10,000 COLONIES/mL INSIGNIFICANT GROWTH Performed at [facility] [address], [place], [place] [address]    Report Status 12/12/2016 FINAL  Final  Culture, blood (Routine x 2)     Status: None (Preliminary result)   Collection Time: 12/10/16 12:42 PM  Result Value Ref Range Status   Specimen Description BLOOD PORT DRAW  Final   Special Requests Blood Culture adequate volume  Final   Culture NO GROWTH 2 DAYS  Final   Report Status PENDING  Incomplete  Culture, blood (Routine x 2)     Status: None (Preliminary result)   Collection Time: 12/10/16 12:51 PM  Result Value Ref Range Status   Specimen Description BLOOD RIGHT ARM  Final   Special Requests   Final    BOTTLES DRAWN AEROBIC AND ANAEROBIC Blood Culture adequate volume   Culture NO GROWTH 2 DAYS  Final   Report Status PENDING  Incomplete      Lab Results  Component Value Date   CALCIUM 7.8 (L) 12/13/2016   PHOS 4.7 (H) 12/12/2016               Impression: 1)Renal  AKI secondary to ATN/AIN                AKI on CKD                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   5)ID- hx of recent C diff Primary MD  following  6)Electrolytes Hypokalemic NOrmonatremic   7)Acid base Co2 at goal  8) CNS- admitted with AMS Etiolology Uremia? Medications   Plan:  Will continue current tx Will  dialyze today Will use 4 k bath as pt hypokalemic    Addendum Pt seen on HD Pt tolerating tx well.   Bronwyn Belasco S 12/13/2016, 8:03 AM

## 2016-12-13 NOTE — Progress Notes (Signed)
PROGRESS NOTE    Eric Scott  OHY:[id number] DOB: May 13, 1951 DOA: 12/10/2016 PCP: [name], MD    Brief Narrative:  66 year old male with history of chronic kidney disease, osteomyelitis on IV antibiotics, presented with alteration in mental status and was found to be unresponsive. In the ER, he was noted to have acute kidney injury and was hypothermic. He was admitted to the hospital and underwent dialysis. He is continued on antibiotics for osteomyelitis as well as recent diagnosis of C. difficile. Overall mental status is slowly improving.   Assessment & Plan:   Principal Problem:   Uremia Active Problems:   DM type 2 (diabetes mellitus, type 2) ([name])   HTN (hypertension)   C. difficile diarrhea   Hypokalemia   COPD with exacerbation ([name])   AKI (acute kidney injury) ([address])   Osteomyelitis ([name])   Volume depletion   Hypotension   1. Acute encephalopathy. Suspect this is related to medication effects in the setting of renal failure as well as uremia. Mental status continues to slowly improve. Sedatives are currently on hold. 2. Acute kidney injury. Started on dialysis per nephrology. Remains oliguric. Will likely undergo another dialysis session today. CK level unremarkable. 3. Chronic osteomyelitis of the feet bilaterally. Currently on IV cefepime and daptomycin. Patient is followed by Dr. Sharol Given. Review of records indicate that Dr. Sharol Given had recommended that patient undergo right foot fifth ray amputation as well as left transtibial amputation. Patient wanted to wait and continue antibiotics for now. He was advised to return to see orthopedics in 3 weeks. He will need to follow-up with orthopedics after discharge. 4. C. difficile. Antigen positive, toxin negative, PCR positive. Currently on oral vancomycin. Still has loose stools. 5. Sinus tachycardia. Started on low-dose beta blocker. 6. Metabolic acidosis. Related to renal failure. Currently on bicarbonate infusion. Serum  bicarbonate has improved. 7. Hypokalemia. Replace 8. Diabetes. Currently on sliding scale. Lantus on hold until po intake is more consistent. Blood sugars have been stable.   DVT prophylaxis: heparin Code Status: full code Family Communication: no family present Disposition Plan: Discharge to SNF when improved   Consultants:   Nephrology  General surgery for dialysis catheter placement  Procedures:     Antimicrobials:   daptomycin   Cefepime  Oral vancomycin   Subjective: Patient is awake and tries to answer questions, but he is confused, disoriented.  Objective: Vitals:   12/13/16 0600 12/13/16 0700 12/13/16 0725 12/13/16 0800  BP: 127/76 132/65  (!) 148/64  Pulse: 80 77 81 81  Resp: 15 15 13 12   Temp:   97.6 F (36.4 C)   TempSrc:   Axillary   SpO2: 94% 94% 95% 96%  Weight:      Height:        Intake/Output Summary (Last 24 hours) at 12/13/16 1005 Last data filed at 12/13/16 0800  Gross per 24 hour  Intake             1580 ml  Output             1100 ml  Net              480 ml   Filed Weights   12/11/16 1745 12/12/16 0500 12/13/16 0458  Weight: 116 kg (255 lb 11.7 oz) 116.6 kg (257 lb 0.9 oz) 116.5 kg (256 lb 13.4 oz)    Examination:  General exam: Alert, awake, confused Respiratory system: Clear to auscultation. Respiratory effort normal. Cardiovascular system:RRR. No murmurs, rubs, gallops.  Gastrointestinal system: Abdomen is nondistended, soft and nontender. No organomegaly or masses felt. Normal bowel sounds heard. Central nervous system:  No focal neurological deficits. Extremities: feet bilaterally have dressings in place Psychiatry: confused     Data Reviewed: I have personally reviewed following labs and imaging studies  CBC:  Recent Labs Lab 12/10/16 1150 12/11/16 0434 12/12/16 0422  WBC 18.0* 14.9* 12.5*  NEUTROABS 16.5*  --   --   HGB 12.5* 11.8* 11.5*  HCT 35.9* 33.8* 32.8*  MCV 94.7 94.2 93.7  PLT 308 290 016    Basic Metabolic Panel:  Recent Labs Lab 12/10/16 1150 12/10/16 1851 12/11/16 0434 12/12/16 0422 12/13/16 0415  NA 137 139 140 139 141  K 3.8 3.8 3.3* 2.9* 2.9*  CL 110 112* 113* 107 109  CO2 12* 12* 15* 21* 22  GLUCOSE 231* 192* 163* 225* 161*  BUN 87* 90* 72* 54* 65*  CREATININE 8.37* 8.55* 6.98* 5.59* 6.17*  CALCIUM 8.0* 7.9* 7.7* 7.7* 7.8*  PHOS  --  8.4* 6.2* 4.7*  --    GFR: Estimated Creatinine Clearance: 15.5 mL/min (A) (by C-G formula based on SCr of 6.17 mg/dL (H)). Liver Function Tests:  Recent Labs Lab 12/10/16 1150 12/10/16 1851 12/11/16 0434 12/12/16 0422  AST 243*  --   --   --   ALT 226*  --   --   --   ALKPHOS 102  --   --   --   BILITOT 0.6  --   --   --   PROT 6.8  --   --   --   ALBUMIN 2.2* 2.0* 2.1* 2.0*   No results for input(s): LIPASE, AMYLASE in the last 168 hours.  Recent Labs Lab 12/10/16 1251  AMMONIA 25   Coagulation Profile:  Recent Labs Lab 12/10/16 1150  INR 1.50   Cardiac Enzymes:  Recent Labs Lab 12/10/16 1056 12/11/16 1108 12/12/16 0700  CKTOTAL  --   --  314  CKMB  --  2  --   TROPONINI 0.04*  --   --    BNP (last 3 results) No results for input(s): PROBNP in the last 8760 hours. HbA1C: No results for input(s): HGBA1C in the last 72 hours. CBG:  Recent Labs Lab 12/12/16 1614 12/12/16 1942 12/13/16 0004 12/13/16 0407 12/13/16 0715  GLUCAP 173* 190* 167* 159* 185*   Lipid Profile: No results for input(s): CHOL, HDL, LDLCALC, TRIG, CHOLHDL, LDLDIRECT in the last 72 hours. Thyroid Function Tests:  Recent Labs  12/11/16 1108  TSH 1.480   Anemia Panel: No results for input(s): VITAMINB12, FOLATE, FERRITIN, TIBC, IRON, RETICCTPCT in the last 72 hours. Sepsis Labs:  Recent Labs Lab 12/10/16 1214 12/10/16 1416  LATICACIDVEN 1.56 0.80    Recent Results (from the past 240 hour(s))  Urine culture     Status: Abnormal   Collection Time: 12/10/16 10:55 AM  Result Value Ref Range Status    Specimen Description URINE, CLEAN CATCH  Final   Special Requests NONE  Final   Culture (A)  Final    <10,000 COLONIES/mL INSIGNIFICANT GROWTH Performed at [facility], [address] [place], [place] [address]    Report Status 12/12/2016 FINAL  Final  Culture, blood (Routine x 2)     Status: None (Preliminary result)   Collection Time: 12/10/16 12:42 PM  Result Value Ref Range Status   Specimen Description BLOOD PORT DRAW  Final   Special Requests Blood Culture adequate volume  Final   Culture NO GROWTH 3 DAYS  Final   Report Status PENDING  Incomplete  Culture, blood (Routine x 2)     Status: None (Preliminary result)   Collection Time: 12/10/16 12:51 PM  Result Value Ref Range Status   Specimen Description BLOOD RIGHT ARM  Final   Special Requests   Final    BOTTLES DRAWN AEROBIC AND ANAEROBIC Blood Culture adequate volume   Culture NO GROWTH 3 DAYS  Final   Report Status PENDING  Incomplete         Radiology Studies: No results found.      Scheduled Meds: . Chlorhexidine Gluconate Cloth  6 each Topical Daily  . Chlorhexidine Gluconate Cloth  6 each Topical Q0600  . collagenase   Topical Daily  . feeding supplement (NEPRO CARB STEADY)  237 mL Oral BID BM  . fluticasone furoate-vilanterol  1 puff Inhalation Daily  . heparin  5,000 Units Subcutaneous Q8H  . insulin aspart  0-9 Units Subcutaneous Q4H  . metoprolol tartrate  5 mg Intravenous Q6H  . mupirocin ointment  1 application Nasal BID  . sodium chloride flush  10-40 mL Intracatheter Q12H  . vancomycin  500 mg Oral Q6H   Continuous Infusions: . sodium chloride    . sodium chloride    . sodium chloride    . sodium chloride    . dextrose 5 % and 0.45% NaCl 1,000 mL with sodium bicarbonate 50 mEq infusion 100 mL/hr at 12/13/16 0800  . potassium chloride       LOS: 3 days    Time spent: 79mins    MEMON,JEHANZEB, MD [facility] Pager [phone]  If 7PM-7AM, please contact  night-coverage [email or web address] Password [email or web address] 12/13/2016, 10:05 AM

## 2016-12-14 LAB — CBC
HEMATOCRIT: 32.5 % — AB (ref 39.0–52.0)
HEMOGLOBIN: 11.2 g/dL — AB (ref 13.0–17.0)
MCH: 32.4 pg (ref 26.0–34.0)
MCHC: 34.5 g/dL (ref 30.0–36.0)
MCV: 93.9 fL (ref 78.0–100.0)
Platelets: 249 10*3/uL (ref 150–400)
RBC: 3.46 MIL/uL — ABNORMAL LOW (ref 4.22–5.81)
RDW: 15.2 % (ref 11.5–15.5)
WBC: 10.8 10*3/uL — ABNORMAL HIGH (ref 4.0–10.5)

## 2016-12-14 LAB — GLUCOSE, CAPILLARY
GLUCOSE-CAPILLARY: 192 mg/dL — AB (ref 65–99)
GLUCOSE-CAPILLARY: 173 mg/dL — AB (ref 65–99)
Glucose-Capillary: 197 mg/dL — ABNORMAL HIGH (ref 65–99)
Glucose-Capillary: 197 mg/dL — ABNORMAL HIGH (ref 65–99)
Glucose-Capillary: 199 mg/dL — ABNORMAL HIGH (ref 65–99)
Glucose-Capillary: 200 mg/dL — ABNORMAL HIGH (ref 65–99)

## 2016-12-14 LAB — BASIC METABOLIC PANEL
ANION GAP: 11 (ref 5–15)
BUN: 49 mg/dL — AB (ref 6–20)
CHLORIDE: 106 mmol/L (ref 101–111)
CO2: 21 mmol/L — AB (ref 22–32)
Calcium: 7.5 mg/dL — ABNORMAL LOW (ref 8.9–10.3)
Creatinine, Ser: 4.59 mg/dL — ABNORMAL HIGH (ref 0.61–1.24)
GFR calc Af Amer: 14 mL/min — ABNORMAL LOW (ref >60)
GFR calc non Af Amer: 12 mL/min — ABNORMAL LOW (ref >60)
GLUCOSE: 208 mg/dL — AB (ref 65–99)
POTASSIUM: 3.2 mmol/L — AB (ref 3.5–5.1)
Sodium: 138 mmol/L (ref 135–145)

## 2016-12-14 LAB — BLOOD GAS, ARTERIAL
Acid-base deficit: 1.3 mmol/L (ref 0.0–2.0)
Bicarbonate: 24 mmol/L (ref 20.0–28.0)
DRAWN BY: 277331
FIO2: 0.21
O2 Saturation: 92.2 %
PATIENT TEMPERATURE: 37
PH ART: 7.48 — AB (ref 7.350–7.450)
pCO2 arterial: 29.6 mmHg — ABNORMAL LOW (ref 32.0–48.0)
pO2, Arterial: 62 mmHg — ABNORMAL LOW (ref 83.0–108.0)

## 2016-12-14 LAB — MRSA PCR SCREENING: MRSA BY PCR: NEGATIVE

## 2016-12-14 LAB — AMMONIA: AMMONIA: 22 umol/L (ref 9–35)

## 2016-12-14 MED ORDER — SACCHAROMYCES BOULARDII 250 MG PO CAPS
250.0000 mg | ORAL_CAPSULE | Freq: Two times a day (BID) | ORAL | Status: DC
  Administered 2016-12-14 – 2016-12-24 (×21): 250 mg via ORAL
  Filled 2016-12-14 (×21): qty 1

## 2016-12-14 NOTE — Progress Notes (Signed)
PROGRESS NOTE    Eric Scott  JXB:[id number] DOB: 30-Sep-1950 DOA: 12/10/2016 PCP: [name], MD    Brief Narrative:  66 year old male with history of chronic kidney disease, osteomyelitis on IV antibiotics, presented with alteration in mental status and was found to be unresponsive. In the ER, he was noted to have acute kidney injury and was hypothermic. He was admitted to the hospital and underwent dialysis. He is continued on antibiotics for osteomyelitis as well as recent diagnosis of C. difficile. Overall mental status is slowly improving.   Assessment & Plan:   Principal Problem:   Uremia Active Problems:   DM type 2 (diabetes mellitus, type 2) ([name])   HTN (hypertension)   C. difficile diarrhea   Hypokalemia   COPD with exacerbation ([name])   AKI (acute kidney injury) ([place])   Osteomyelitis ([name])   Volume depletion   Hypotension   1. Acute encephalopathy. Suspect this is related to medication effects in the setting of renal failure as well as uremia. Mental status continues to slowly improve. Sedatives are currently on hold. 2. Acute kidney injury. Started on dialysis per nephrology. Urine output 300cc in the last 24 hours. CK level unremarkable. Currently has temporary femoral dialysis catheter. Need for Desilva term dialysis access per nephrology 3. Chronic osteomyelitis of the feet bilaterally. Currently on IV cefepime and daptomycin. Patient is followed by Dr. Sharol Given. Review of records indicate that Dr. Sharol Given had recommended that patient undergo right foot fifth ray amputation as well as left transtibial amputation. Patient wanted to wait and continue antibiotics for now. He will need to follow-up with orthopedics after discharge. 4. C. difficile. Antigen positive, toxin negative, PCR positive. Currently on oral vancomycin. Still has loose stools, but has not been eating anything solid. 5. Sinus tachycardia. Started on low-dose beta blocker. improved 6. Metabolic acidosis.  Related to renal failure. Currently on bicarbonate infusion. Serum bicarbonate has improved. 7. Hypokalemia. Replace 8. Diabetes. Currently on sliding scale. Lantus on hold until po intake is more consistent. Blood sugars have been stable.   DVT prophylaxis: heparin Code Status: full code Family Communication: no family present Disposition Plan: Discharge to SNF when improved   Consultants:   Nephrology  General surgery for dialysis catheter placement  Procedures:     Antimicrobials:   daptomycin   Cefepime  Oral vancomycin   Subjective: Patient is confused, he reports some pain in abdomen  Objective: Vitals:   12/14/16 0400 12/14/16 0500 12/14/16 0800 12/14/16 0946  BP: (!) 117/57 (!) 116/45    Pulse: 82 85    Resp: 15 17    Temp: (!) 97.4 F (36.3 C)  98.5 F (36.9 C)   TempSrc: Oral  Oral   SpO2: 94% 96%  100%  Weight:  117.9 kg (259 lb 14.8 oz)    Height:        Intake/Output Summary (Last 24 hours) at 12/14/16 1010 Last data filed at 12/14/16 0500  Gross per 24 hour  Intake             2490 ml  Output              300 ml  Net             2190 ml   Filed Weights   12/13/16 0458 12/13/16 1245 12/14/16 0500  Weight: 116.5 kg (256 lb 13.4 oz) 115.5 kg (254 lb 10.1 oz) 117.9 kg (259 lb 14.8 oz)    Examination:  General exam: Alert,  awake, disoriented Respiratory system: Clear to auscultation. Respiratory effort normal. Cardiovascular system:RRR. No murmurs, rubs, gallops. Gastrointestinal system: Abdomen is nondistended, soft and nontender. No organomegaly or masses felt. Normal bowel sounds heard. Central nervous system: No focal neurological deficits. Extremities: feet bilaterally are dressed with bandages Skin: No rashes, lesions or ulcers Psychiatry: confused, slow to respond      Data Reviewed: I have personally reviewed following labs and imaging studies  CBC:  Recent Labs Lab 12/10/16 1150 12/11/16 0434 12/12/16 0422  12/14/16 0459  WBC 18.0* 14.9* 12.5* 10.8*  NEUTROABS 16.5*  --   --   --   HGB 12.5* 11.8* 11.5* 11.2*  HCT 35.9* 33.8* 32.8* 32.5*  MCV 94.7 94.2 93.7 93.9  PLT 308 290 273 527   Basic Metabolic Panel:  Recent Labs Lab 12/10/16 1851 12/11/16 0434 12/12/16 0422 12/13/16 0415 12/14/16 0459  NA 139 140 139 141 138  K 3.8 3.3* 2.9* 2.9* 3.2*  CL 112* 113* 107 109 106  CO2 12* 15* 21* 22 21*  GLUCOSE 192* 163* 225* 161* 208*  BUN 90* 72* 54* 65* 49*  CREATININE 8.55* 6.98* 5.59* 6.17* 4.59*  CALCIUM 7.9* 7.7* 7.7* 7.8* 7.5*  PHOS 8.4* 6.2* 4.7*  --   --    GFR: Estimated Creatinine Clearance: 21 mL/min (A) (by C-G formula based on SCr of 4.59 mg/dL (H)). Liver Function Tests:  Recent Labs Lab 12/10/16 1150 12/10/16 1851 12/11/16 0434 12/12/16 0422  AST 243*  --   --   --   ALT 226*  --   --   --   ALKPHOS 102  --   --   --   BILITOT 0.6  --   --   --   PROT 6.8  --   --   --   ALBUMIN 2.2* 2.0* 2.1* 2.0*   No results for input(s): LIPASE, AMYLASE in the last 168 hours.  Recent Labs Lab 12/10/16 1251  AMMONIA 25   Coagulation Profile:  Recent Labs Lab 12/10/16 1150  INR 1.50   Cardiac Enzymes:  Recent Labs Lab 12/10/16 1056 12/11/16 1108 12/12/16 0700  CKTOTAL  --   --  314  CKMB  --  2  --   TROPONINI 0.04*  --   --    BNP (last 3 results) No results for input(s): PROBNP in the last 8760 hours. HbA1C: No results for input(s): HGBA1C in the last 72 hours. CBG:  Recent Labs Lab 12/13/16 1621 12/13/16 1952 12/13/16 2349 12/14/16 0413 12/14/16 0802  GLUCAP 123* 195* 199* 200* 197*   Lipid Profile: No results for input(s): CHOL, HDL, LDLCALC, TRIG, CHOLHDL, LDLDIRECT in the last 72 hours. Thyroid Function Tests:  Recent Labs  12/11/16 1108  TSH 1.480   Anemia Panel: No results for input(s): VITAMINB12, FOLATE, FERRITIN, TIBC, IRON, RETICCTPCT in the last 72 hours. Sepsis Labs:  Recent Labs Lab 12/10/16 1214 12/10/16 1416   LATICACIDVEN 1.56 0.80    Recent Results (from the past 240 hour(s))  Urine culture     Status: Abnormal   Collection Time: 12/10/16 10:55 AM  Result Value Ref Range Status   Specimen Description URINE, CLEAN CATCH  Final   Special Requests NONE  Final   Culture (A)  Final    <10,000 COLONIES/mL INSIGNIFICANT GROWTH Performed at [facility], [address] [place], [place] [address]    Report Status 12/12/2016 FINAL  Final  Culture, blood (Routine x 2)  Status: None (Preliminary result)   Collection Time: 12/10/16 12:42 PM  Result Value Ref Range Status   Specimen Description BLOOD PORT DRAW  Final   Special Requests Blood Culture adequate volume  Final   Culture NO GROWTH 4 DAYS  Final   Report Status PENDING  Incomplete  Culture, blood (Routine x 2)     Status: None (Preliminary result)   Collection Time: 12/10/16 12:51 PM  Result Value Ref Range Status   Specimen Description BLOOD RIGHT ARM  Final   Special Requests   Final    BOTTLES DRAWN AEROBIC AND ANAEROBIC Blood Culture adequate volume   Culture NO GROWTH 4 DAYS  Final   Report Status PENDING  Incomplete         Radiology Studies: No results found.      Scheduled Meds: . chlorhexidine  15 mL Mouth Rinse BID  . Chlorhexidine Gluconate Cloth  6 each Topical Daily  . Chlorhexidine Gluconate Cloth  6 each Topical Q0600  . collagenase   Topical Daily  . feeding supplement (NEPRO CARB STEADY)  237 mL Oral BID BM  . fluticasone furoate-vilanterol  1 puff Inhalation Daily  . heparin  5,000 Units Subcutaneous Q8H  . insulin aspart  0-9 Units Subcutaneous Q4H  . mouth rinse  15 mL Mouth Rinse q12n4p  . metoprolol tartrate  5 mg Intravenous Q6H  . mupirocin ointment  1 application Nasal BID  . sodium chloride flush  10-40 mL Intracatheter Q12H  . vancomycin  500 mg Oral Q6H   Continuous Infusions: . sodium chloride    . sodium chloride    . sodium chloride    . sodium chloride    . [START ON  12/15/2016] ceFEPime (MAXIPIME) IV    . DAPTOmycin (CUBICIN)  IV Stopped (12/13/16 1824)  . dextrose 5 % and 0.45% NaCl 1,000 mL with sodium bicarbonate 50 mEq infusion 100 mL/hr at 12/14/16 0314     LOS: 4 days    Time spent: 29mins    MEMON,JEHANZEB, MD [facility] Pager [phone]  If 7PM-7AM, please contact night-coverage [email or web address] Password [email or web address] 12/14/2016, 10:10 AM

## 2016-12-14 NOTE — Plan of Care (Signed)
Problem: Education: Goal: Knowledge of [facility] Education information/materials will improve Outcome: Not Progressing Patient currently only oriented to person, attempts made to educate the patient

## 2016-12-14 NOTE — Care Management Important Message (Signed)
Important Message  Patient Details  Name: Eric Scott MRN: [id number] Date of Birth: [birth year]   Medicare Important Message Given:  Yes    Sherald Barge, RN 12/14/2016, 1:33 PM

## 2016-12-14 NOTE — Progress Notes (Signed)
Eric Scott  MRN: [id number]  DOB/AGE: [birth year] 66 y.o.  Primary Care Physician:Hasanaj, Samul Dada, MD  Admit date: 12/10/2016  Chief Complaint:  Chief Complaint  Patient presents with  . Weakness    S-Pt presented on  12/10/2016 with  Chief Complaint  Patient presents with  . Weakness  .    Pt offer no new  complaints.    Pt is  responding much better than before.    Meds . chlorhexidine  15 mL Mouth Rinse BID  . Chlorhexidine Gluconate Cloth  6 each Topical Daily  . Chlorhexidine Gluconate Cloth  6 each Topical Q0600  . collagenase   Topical Daily  . feeding supplement (NEPRO CARB STEADY)  237 mL Oral BID BM  . fluticasone furoate-vilanterol  1 puff Inhalation Daily  . heparin  5,000 Units Subcutaneous Q8H  . insulin aspart  0-9 Units Subcutaneous Q4H  . mouth rinse  15 mL Mouth Rinse q12n4p  . metoprolol tartrate  5 mg Intravenous Q6H  . mupirocin ointment  1 application Nasal BID  . sodium chloride flush  10-40 mL Intracatheter Q12H  . vancomycin  500 mg Oral Q6H         Physical Exam: Vital signs in last 24 hours: Temp:  [97.4 F (36.3 C)-98.6 F (37 C)] 98.5 F (36.9 C) (07/13 0800) Pulse Rate:  [74-94] 85 (07/13 0500) Resp:  [10-20] 17 (07/13 0500) BP: (111-165)/(41-83) 116/45 (07/13 0500) SpO2:  [94 %-98 %] 96 % (07/13 0500) Weight:  [254 lb 10.1 oz (115.5 kg)-259 lb 14.8 oz (117.9 kg)] 259 lb 14.8 oz (117.9 kg) (07/13 0500) Weight change: -2 lb 3.3 oz (-1 kg) Last BM Date: 12/13/16  Intake/Output from previous day: 07/12 0701 - 07/13 0700 In: 2910 [P.O.:360; I.V.:2200; IV Piggyback:350] Out: 300 [Urine:300] No intake/output data recorded.   Physical Exam: General- pt is awake,follows commands .  Resp- No acute REsp distress,NO Rhonchi CVS- S1S2 regular in rate and rhythm GIT- BS+, soft, NT, ND EXT- NO LE Edema, Cyanosis   Lab Results: CBC  Recent Labs  12/12/16 0422 12/14/16 0459  WBC 12.5* 10.8*  HGB 11.5* 11.2*  HCT 32.8* 32.5*   PLT 273 249    BMET  Recent Labs  12/13/16 0415 12/14/16 0459  NA 141 138  K 2.9* 3.2*  CL 109 106  CO2 22 21*  GLUCOSE 161* 208*  BUN 65* 49*  CREATININE 6.17* 4.59*  CALCIUM 7.8* 7.5*   Creat trend 2018   In July 8.55==> 5.5( After HD) ==> 6.1==> 4.59( After HD) In June 1.5--4.0 In May 1.0--1.5 2016 1.2--1.5    MICRO Recent Results (from the past 240 hour(s))  Urine culture     Status: Abnormal   Collection Time: 12/10/16 10:55 AM  Result Value Ref Range Status   Specimen Description URINE, CLEAN CATCH  Final   Special Requests NONE  Final   Culture (A)  Final    <10,000 COLONIES/mL INSIGNIFICANT GROWTH Performed at [facility], [place] [address], [place], [place] [address]    Report Status 12/12/2016 FINAL  Final  Culture, blood (Routine x 2)     Status: None (Preliminary result)   Collection Time: 12/10/16 12:42 PM  Result Value Ref Range Status   Specimen Description BLOOD PORT DRAW  Final   Special Requests Blood Culture adequate volume  Final   Culture NO GROWTH 4 DAYS  Final   Report Status PENDING  Incomplete  Culture, blood (Routine x 2)  Status: None (Preliminary result)   Collection Time: 12/10/16 12:51 PM  Result Value Ref Range Status   Specimen Description BLOOD RIGHT ARM  Final   Special Requests   Final    BOTTLES DRAWN AEROBIC AND ANAEROBIC Blood Culture adequate volume   Culture NO GROWTH 4 DAYS  Final   Report Status PENDING  Incomplete      Lab Results  Component Value Date   CALCIUM 7.5 (L) 12/14/2016   PHOS 4.7 (H) 12/12/2016               Impression: 1)Renal  AKI secondary to ATN/AIN                AKI on CKD                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   5)ID- hx of recent C diff Primary MD following  6)Electrolytes Hypokalemic NOrmonatremic   7)Acid base Co2 at goal  8) CNS- admitted with AMS Etiolology Uremia? Medications   Plan:  Will continue current care Will most likely dialyze in am.  Lillee Mooneyhan S 12/14/2016, 8:40 AM

## 2016-12-14 NOTE — Progress Notes (Signed)
1305 Patient transferred to Dept 300 room# 301 with belongings, chart & medication. Report given to receiving nurse, Lilia Pro, prior to transport. Lilia Pro aware of patient's arrival to Dept 300 and was given patient's chart and medication. Patient's son notified and made aware of patient's transfer and new room #.

## 2016-12-15 LAB — CULTURE, BLOOD (ROUTINE X 2)
CULTURE: NO GROWTH
CULTURE: NO GROWTH
SPECIAL REQUESTS: ADEQUATE
SPECIAL REQUESTS: ADEQUATE

## 2016-12-15 LAB — BASIC METABOLIC PANEL
ANION GAP: 11 (ref 5–15)
BUN: 54 mg/dL — AB (ref 6–20)
CO2: 23 mmol/L (ref 22–32)
Calcium: 7.4 mg/dL — ABNORMAL LOW (ref 8.9–10.3)
Chloride: 106 mmol/L (ref 101–111)
Creatinine, Ser: 4.95 mg/dL — ABNORMAL HIGH (ref 0.61–1.24)
GFR calc Af Amer: 13 mL/min — ABNORMAL LOW (ref >60)
GFR, EST NON AFRICAN AMERICAN: 11 mL/min — AB (ref >60)
Glucose, Bld: 188 mg/dL — ABNORMAL HIGH (ref 65–99)
POTASSIUM: 2.7 mmol/L — AB (ref 3.5–5.1)
Sodium: 140 mmol/L (ref 135–145)

## 2016-12-15 LAB — GLUCOSE, CAPILLARY
GLUCOSE-CAPILLARY: 168 mg/dL — AB (ref 65–99)
GLUCOSE-CAPILLARY: 175 mg/dL — AB (ref 65–99)
GLUCOSE-CAPILLARY: 209 mg/dL — AB (ref 65–99)
Glucose-Capillary: 213 mg/dL — ABNORMAL HIGH (ref 65–99)
Glucose-Capillary: 194 mg/dL — ABNORMAL HIGH (ref 65–99)

## 2016-12-15 LAB — MAGNESIUM: MAGNESIUM: 1.4 mg/dL — AB (ref 1.7–2.4)

## 2016-12-15 MED ORDER — POTASSIUM CHLORIDE 10 MEQ/100ML IV SOLN: 10.0000 meq | INTRAVENOUS | Status: DC

## 2016-12-15 MED ORDER — NICOTINE 21 MG/24HR TD PT24
21.0000 mg | MEDICATED_PATCH | Freq: Every day | TRANSDERMAL | Status: DC
  Administered 2016-12-15 – 2016-12-24 (×10): 21 mg via TRANSDERMAL
  Filled 2016-12-15 (×10): qty 1

## 2016-12-15 MED ORDER — ACETAMINOPHEN 325 MG PO TABS
650.0000 mg | ORAL_TABLET | Freq: Four times a day (QID) | ORAL | Status: DC | PRN
  Administered 2016-12-15 – 2016-12-20 (×3): 650 mg via ORAL
  Filled 2016-12-15 (×3): qty 2

## 2016-12-15 MED ORDER — METOPROLOL TARTRATE 25 MG PO TABS
25.0000 mg | ORAL_TABLET | Freq: Two times a day (BID) | ORAL | Status: DC
  Administered 2016-12-15 – 2016-12-24 (×18): 25 mg via ORAL
  Filled 2016-12-15 (×18): qty 1

## 2016-12-15 MED ORDER — MAGNESIUM SULFATE 2 GM/50ML IV SOLN: 2.0000 g | Freq: Once | INTRAVENOUS | Status: DC

## 2016-12-15 MED ORDER — DAPTOMYCIN 500 MG IV SOLR
INTRAVENOUS | Status: AC
  Filled 2016-12-15: qty 20

## 2016-12-15 MED ORDER — MAGNESIUM SULFATE IN D5W 1-5 GM/100ML-% IV SOLN
INTRAVENOUS | Status: AC
  Filled 2016-12-15: qty 200

## 2016-12-15 MED ORDER — MAGNESIUM SULFATE IN D5W 1-5 GM/100ML-% IV SOLN
1.0000 g | Freq: Once | INTRAVENOUS | Status: AC
  Administered 2016-12-15: 1 g via INTRAVENOUS
  Filled 2016-12-15 (×2): qty 100

## 2016-12-15 NOTE — Progress Notes (Signed)
CRITICAL VALUE ALERT  Critical Value:  Potassium 2.7  Date & Time Notied:  12/15/2016 0700  Provider Notified: Kathie Dike, MD  Orders Received/Actions taken: No new orders at this time.

## 2016-12-15 NOTE — Progress Notes (Signed)
PROGRESS NOTE    Eric Scott  CWC:[id number] DOB: [birth year] DOA: 12/10/2016 PCP: [name], MD    Brief Narrative:  66 year old male with history of chronic kidney disease, osteomyelitis on IV antibiotics, presented with alteration in mental status and was found to be unresponsive. In the ER, he was noted to have acute kidney injury and was hypothermic. He was admitted to the hospital and underwent dialysis. He is continued on antibiotics for osteomyelitis as well as recent diagnosis of C. difficile. Overall mental status is slowly improving.   Assessment & Plan:   Principal Problem:   Uremia Active Problems:   DM type 2 (diabetes mellitus, type 2) ([name])   HTN (hypertension)   C. difficile diarrhea   Hypokalemia   COPD with exacerbation ([name])   AKI (acute kidney injury) ([place])   Osteomyelitis ([name])   Volume depletion   Hypotension   1. Acute encephalopathy. Suspect this is related to medication effects in the setting of renal failure as well as uremia. Mental status continues to slowly improve. Sedatives are currently on hold. 2. Acute kidney injury. Started on dialysis per nephrology. We will get another dialysis session today. CK level unremarkable. Currently has temporary femoral dialysis catheter. Need for [place] term dialysis access per nephrology 3. Chronic osteomyelitis of the feet bilaterally. Currently on IV cefepime and daptomycin. Patient is followed by Dr. Sharol Given. Review of records indicate that Dr. Sharol Given had recommended that patient undergo right foot fifth ray amputation as well as left transtibial amputation. Patient wanted to wait and continue antibiotics for now. He will need to follow-up with orthopedics after discharge. 4. C. difficile. Antigen positive, toxin negative, PCR positive. Currently on oral vancomycin. Still has loose stools, but has not been eating anything solid. 5. Sinus tachycardia. Started on low-dose beta blocker. improved. Change Lopressor to by  mouth 6. Metabolic acidosis. Related to renal failure. Currently on bicarbonate infusion. Serum bicarbonate has improved. 7. Hypokalemia. Discussed with nephrology and he will receive a 4K bath with dialysis which should correct his potassium. Magnesium is low and will be replaced 8. Diabetes. Currently on sliding scale. Lantus on hold until po intake is more consistent. Blood sugars have been stable.   DVT prophylaxis: heparin Code Status: full code Family Communication: no family present Disposition Plan: Discharge to SNF when improved   Consultants:   Nephrology  General surgery for dialysis catheter placement  Procedures:     Antimicrobials:   daptomycin   Cefepime  Oral vancomycin   Subjective: Appears to be mildly more alert today. Still is somewhat confused. Denies any new complaints  Objective: Vitals:   12/15/16 1400 12/15/16 1430 12/15/16 1500 12/15/16 1530  BP: 122/64 (!) 122/55 (!) 100/42 129/67  Pulse: 81 70 67 72  Resp:      Temp:      TempSrc:      SpO2:      Weight:      Height:        Intake/Output Summary (Last 24 hours) at 12/15/16 1652 Last data filed at 12/15/16 1557  Gross per 24 hour  Intake             3665 ml  Output                0 ml  Net             3665 ml   Filed Weights   12/13/16 1245 12/14/16 0500 12/15/16 1215  Weight: 115.5 kg (254  lb 10.1 oz) 117.9 kg (259 lb 14.8 oz) 117.9 kg (259 lb 14.8 oz)    Examination:  General exam: Alert, awake, disoriented Respiratory system: Clear to auscultation. Respiratory effort normal. Cardiovascular system:RRR. No murmurs, rubs, gallops. Gastrointestinal system: Abdomen is nondistended, soft and nontender. No organomegaly or masses felt. Normal bowel sounds heard. Central nervous system: No focal neurological deficits. Extremities: feet bilaterally are dressed with bandages Skin: No rashes, lesions or ulcers Psychiatry: confused, slow to respond      Data Reviewed: I have  personally reviewed following labs and imaging studies  CBC:  Recent Labs Lab 12/10/16 1150 12/11/16 0434 12/12/16 0422 12/14/16 0459  WBC 18.0* 14.9* 12.5* 10.8*  NEUTROABS 16.5*  --   --   --   HGB 12.5* 11.8* 11.5* 11.2*  HCT 35.9* 33.8* 32.8* 32.5*  MCV 94.7 94.2 93.7 93.9  PLT 308 290 273 749   Basic Metabolic Panel:  Recent Labs Lab 12/10/16 1851 12/11/16 0434 12/12/16 0422 12/13/16 0415 12/14/16 0459 12/15/16 0533  NA 139 140 139 141 138 140  K 3.8 3.3* 2.9* 2.9* 3.2* 2.7*  CL 112* 113* 107 109 106 106  CO2 12* 15* 21* 22 21* 23  GLUCOSE 192* 163* 225* 161* 208* 188*  BUN 90* 72* 54* 65* 49* 54*  CREATININE 8.55* 6.98* 5.59* 6.17* 4.59* 4.95*  CALCIUM 7.9* 7.7* 7.7* 7.8* 7.5* 7.4*  MG  --   --   --   --   --  1.4*  PHOS 8.4* 6.2* 4.7*  --   --   --    GFR: Estimated Creatinine Clearance: 19.5 mL/min (A) (by C-G formula based on SCr of 4.95 mg/dL (H)). Liver Function Tests:  Recent Labs Lab 12/10/16 1150 12/10/16 1851 12/11/16 0434 12/12/16 0422  AST 243*  --   --   --   ALT 226*  --   --   --   ALKPHOS 102  --   --   --   BILITOT 0.6  --   --   --   PROT 6.8  --   --   --   ALBUMIN 2.2* 2.0* 2.1* 2.0*   No results for input(s): LIPASE, AMYLASE in the last 168 hours.  Recent Labs Lab 12/10/16 1251 12/14/16 1157  AMMONIA 25 22   Coagulation Profile:  Recent Labs Lab 12/10/16 1150  INR 1.50   Cardiac Enzymes:  Recent Labs Lab 12/10/16 1056 12/11/16 1108 12/12/16 0700  CKTOTAL  --   --  314  CKMB  --  2  --   TROPONINI 0.04*  --   --    BNP (last 3 results) No results for input(s): PROBNP in the last 8760 hours. HbA1C: No results for input(s): HGBA1C in the last 72 hours. CBG:  Recent Labs Lab 12/14/16 1714 12/14/16 2029 12/15/16 0107 12/15/16 0427 12/15/16 0736  GLUCAP 192* 173* 213* 194* 168*   Lipid Profile: No results for input(s): CHOL, HDL, LDLCALC, TRIG, CHOLHDL, LDLDIRECT in the last 72 hours. Thyroid  Function Tests: No results for input(s): TSH, T4TOTAL, FREET4, T3FREE, THYROIDAB in the last 72 hours. Anemia Panel: No results for input(s): VITAMINB12, FOLATE, FERRITIN, TIBC, IRON, RETICCTPCT in the last 72 hours. Sepsis Labs:  Recent Labs Lab 12/10/16 1214 12/10/16 1416  LATICACIDVEN 1.56 0.80    Recent Results (from the past 240 hour(s))  Urine culture     Status: Abnormal   Collection Time: 12/10/16 10:55 AM  Result Value Ref Range Status  Specimen Description URINE, CLEAN CATCH  Final   Special Requests NONE  Final   Culture (A)  Final    <10,000 COLONIES/mL INSIGNIFICANT GROWTH Performed at [facility], [place] [address] [place], [place] [address]    Report Status 12/12/2016 FINAL  Final  Culture, blood (Routine x 2)     Status: None   Collection Time: 12/10/16 12:42 PM  Result Value Ref Range Status   Specimen Description BLOOD PORT DRAW  Final   Special Requests Blood Culture adequate volume  Final   Culture NO GROWTH 5 DAYS  Final   Report Status 12/15/2016 FINAL  Final  Culture, blood (Routine x 2)     Status: None   Collection Time: 12/10/16 12:51 PM  Result Value Ref Range Status   Specimen Description BLOOD RIGHT ARM  Final   Special Requests   Final    BOTTLES DRAWN AEROBIC AND ANAEROBIC Blood Culture adequate volume   Culture NO GROWTH 5 DAYS  Final   Report Status 12/15/2016 FINAL  Final  MRSA PCR Screening     Status: None   Collection Time: 12/14/16  8:25 AM  Result Value Ref Range Status   MRSA by PCR NEGATIVE NEGATIVE Final    Comment:        The GeneXpert MRSA Assay (FDA approved for NASAL specimens only), is one component of a comprehensive MRSA colonization surveillance program. It is not intended to diagnose MRSA infection nor to guide or monitor treatment for MRSA infections.          Radiology Studies: No results found.      Scheduled Meds: . chlorhexidine  15 mL Mouth Rinse BID  . Chlorhexidine Gluconate Cloth  6  each Topical Daily  . Chlorhexidine Gluconate Cloth  6 each Topical Q0600  . collagenase   Topical Daily  . feeding supplement (NEPRO CARB STEADY)  237 mL Oral BID BM  . fluticasone furoate-vilanterol  1 puff Inhalation Daily  . heparin  5,000 Units Subcutaneous Q8H  . insulin aspart  0-9 Units Subcutaneous Q4H  . mouth rinse  15 mL Mouth Rinse q12n4p  . metoprolol tartrate  5 mg Intravenous Q6H  . saccharomyces boulardii  250 mg Oral BID  . sodium chloride flush  10-40 mL Intracatheter Q12H  . vancomycin  500 mg Oral Q6H   Continuous Infusions: . sodium chloride    . sodium chloride    . sodium chloride    . sodium chloride    . ceFEPime (MAXIPIME) IV Stopped (12/15/16 1618)  . DAPTOmycin (CUBICIN)  IV Stopped (12/13/16 1824)  . dextrose 5 % and 0.45% NaCl 1,000 mL with sodium bicarbonate 50 mEq infusion 100 mL/hr at 12/15/16 1557     LOS: 5 days    Time spent: 4mins    Chandani Rogowski, MD [facility] Pager [phone]  If 7PM-7AM, please contact night-coverage [email or web address] Password [facility] 12/15/2016, 4:52 PM

## 2016-12-15 NOTE — Progress Notes (Signed)
Eric Scott  MRN: [id number]  DOB/AGE: 03-Sep-1950 66 y.o.  Primary Care Physician:Scott, Eric Dada, MD  Admit date: 12/10/2016  Chief Complaint:  Chief Complaint  Patient presents with  . Weakness    Scott-Pt presented on  12/10/2016 with  Chief Complaint  Patient presents with  . Weakness  .    Pt offer no new  complaints.    Pt is  responding much better than before.    Meds . chlorhexidine  15 mL Mouth Rinse BID  . Chlorhexidine Gluconate Cloth  6 each Topical Daily  . Chlorhexidine Gluconate Cloth  6 each Topical Q0600  . collagenase   Topical Daily  . feeding supplement (NEPRO CARB STEADY)  237 mL Oral BID BM  . fluticasone furoate-vilanterol  1 puff Inhalation Daily  . heparin  5,000 Units Subcutaneous Q8H  . insulin aspart  0-9 Units Subcutaneous Q4H  . mouth rinse  15 mL Mouth Rinse q12n4p  . metoprolol tartrate  5 mg Intravenous Q6H  . mupirocin ointment  1 application Nasal BID  . saccharomyces boulardii  250 mg Oral BID  . sodium chloride flush  10-40 mL Intracatheter Q12H  . vancomycin  500 mg Oral Q6H         Physical Exam: Vital signs in last 24 hours: Temp:  [98.2 F (36.8 C)-98.9 F (37.2 C)] 98.9 F (37.2 C) (07/13 2104) Pulse Rate:  [81-90] 87 (07/13 2104) Resp:  [15-20] 20 (07/13 2104) BP: (121-150)/(52-69) 134/52 (07/13 2104) SpO2:  [93 %-100 %] 97 % (07/14 0807) Weight change:  Last BM Date: 12/14/16  Intake/Output from previous day: 07/13 0701 - 07/14 0700 In: 2200 [I.V.:2200] Out: -  No intake/output data recorded.   Physical Exam: General- pt is awake,follows commands .  Resp- No acute REsp distress,NO Rhonchi CVS- S1S2 regular in rate and rhythm GIT- BS+, soft, NT, ND EXT- NO LE Edema, Cyanosis   Lab Results: CBC  Recent Labs  12/14/16 0459  WBC 10.8*  HGB 11.2*  HCT 32.5*  PLT 249    BMET  Recent Labs  12/14/16 0459 12/15/16 0533  NA 138 140  K 3.2* 2.7*  CL 106 106  CO2 21* 23  GLUCOSE 208* 188*  BUN  49* 54*  CREATININE 4.59* 4.95*  CALCIUM 7.5* 7.4*   Creat trend 2018   In July 8.55==> 5.5( After HD) ==> 6.1==> 4.59( After HD)=>4.95 In June 1.5--4.0 In May 1.0--1.5 2016 1.2--1.5    MICRO Recent Results (from the past 240 hour(Scott))  Urine culture     Status: Abnormal   Collection Time: 12/10/16 10:55 AM  Result Value Ref Range Status   Specimen Description URINE, CLEAN CATCH  Final   Special Requests NONE  Final   Culture (A)  Final    <10,000 COLONIES/mL INSIGNIFICANT GROWTH Performed at [facility], [place] [address], [place], [place] [address]    Report Status 12/12/2016 FINAL  Final  Culture, blood (Routine x 2)     Status: None   Collection Time: 12/10/16 12:42 PM  Result Value Ref Range Status   Specimen Description BLOOD PORT DRAW  Final   Special Requests Blood Culture adequate volume  Final   Culture NO GROWTH 5 DAYS  Final   Report Status 12/15/2016 FINAL  Final  Culture, blood (Routine x 2)     Status: None   Collection Time: 12/10/16 12:51 PM  Result Value Ref Range Status   Specimen Description BLOOD RIGHT ARM  Final   Special Requests   Final    BOTTLES DRAWN AEROBIC AND ANAEROBIC Blood Culture adequate volume   Culture NO GROWTH 5 DAYS  Final   Report Status 12/15/2016 FINAL  Final  MRSA PCR Screening     Status: None   Collection Time: 12/14/16  8:25 AM  Result Value Ref Range Status   MRSA by PCR NEGATIVE NEGATIVE Final    Comment:        The GeneXpert MRSA Assay (FDA approved for NASAL specimens only), is one component of a comprehensive MRSA colonization surveillance program. It is not intended to diagnose MRSA infection nor to guide or monitor treatment for MRSA infections.       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   5)ID- hx of recent C diff Primary MD following  6)Electrolytes Hypokalemic NOrmonatremic   7)Acid base Co2 at goal  8) CNS- admitted with AMS Now better Etiolology Uremia? Medications   Plan:  Will  Dialyze today. Will use4k bath   Eric Scott 12/15/2016, 8:17 AM

## 2016-12-16 LAB — GLUCOSE, CAPILLARY
GLUCOSE-CAPILLARY: 195 mg/dL — AB (ref 65–99)
GLUCOSE-CAPILLARY: 166 mg/dL — AB (ref 65–99)
Glucose-Capillary: 183 mg/dL — ABNORMAL HIGH (ref 65–99)
Glucose-Capillary: 183 mg/dL — ABNORMAL HIGH (ref 65–99)
Glucose-Capillary: 223 mg/dL — ABNORMAL HIGH (ref 65–99)

## 2016-12-16 LAB — BASIC METABOLIC PANEL WITH GFR
Anion gap: 11 (ref 5–15)
BUN: 37 mg/dL — ABNORMAL HIGH (ref 6–20)
CO2: 26 mmol/L (ref 22–32)
Calcium: 7.7 mg/dL — ABNORMAL LOW (ref 8.9–10.3)
Chloride: 105 mmol/L (ref 101–111)
Creatinine, Ser: 3.5 mg/dL — ABNORMAL HIGH (ref 0.61–1.24)
GFR calc Af Amer: 19 mL/min — ABNORMAL LOW
GFR calc non Af Amer: 17 mL/min — ABNORMAL LOW
Glucose, Bld: 197 mg/dL — ABNORMAL HIGH (ref 65–99)
Potassium: 2.9 mmol/L — ABNORMAL LOW (ref 3.5–5.1)
Sodium: 142 mmol/L (ref 135–145)

## 2016-12-16 LAB — MAGNESIUM: Magnesium: 1.9 mg/dL (ref 1.7–2.4)

## 2016-12-16 MED ORDER — ALPRAZOLAM 0.5 MG PO TABS
0.5000 mg | ORAL_TABLET | Freq: Two times a day (BID) | ORAL | Status: DC | PRN
  Administered 2016-12-18 – 2016-12-23 (×7): 0.5 mg via ORAL
  Filled 2016-12-16 (×7): qty 1

## 2016-12-16 MED ORDER — OXYCODONE HCL 5 MG PO TABS
5.0000 mg | ORAL_TABLET | Freq: Four times a day (QID) | ORAL | Status: DC | PRN
  Administered 2016-12-16 – 2016-12-23 (×11): 5 mg via ORAL
  Filled 2016-12-16 (×11): qty 1

## 2016-12-16 MED ORDER — SODIUM CHLORIDE 0.9 % IV SOLN
INTRAVENOUS | Status: DC
  Administered 2016-12-16 – 2016-12-18 (×3): via INTRAVENOUS

## 2016-12-16 MED ORDER — POTASSIUM CHLORIDE CRYS ER 20 MEQ PO TBCR
20.0000 meq | EXTENDED_RELEASE_TABLET | Freq: Once | ORAL | Status: AC
Start: 2016-12-16 — End: 2016-12-16
  Administered 2016-12-16: 20 meq via ORAL
  Filled 2016-12-16: qty 1

## 2016-12-16 NOTE — Progress Notes (Addendum)
Eric Scott  MRN: [id number]  DOB/AGE: 02/16/1951 66 y.o.  Primary Care Physician:Hasanaj, Samul Dada, MD  Admit date: 12/10/2016  Chief Complaint:  Chief Complaint  Patient presents with  . Weakness    S-Pt presented on  12/10/2016 with  Chief Complaint  Patient presents with  . Weakness  .    Pt offer no new  complaints.    Pt is  responding much better than before.    Meds . chlorhexidine  15 mL Mouth Rinse BID  . Chlorhexidine Gluconate Cloth  6 each Topical Daily  . Chlorhexidine Gluconate Cloth  6 each Topical Q0600  . collagenase   Topical Daily  . feeding supplement (NEPRO CARB STEADY)  237 mL Oral BID BM  . fluticasone furoate-vilanterol  1 puff Inhalation Daily  . heparin  5,000 Units Subcutaneous Q8H  . insulin aspart  0-9 Units Subcutaneous Q4H  . mouth rinse  15 mL Mouth Rinse q12n4p  . metoprolol tartrate  25 mg Oral BID  . nicotine  21 mg Transdermal Daily  . saccharomyces boulardii  250 mg Oral BID  . sodium chloride flush  10-40 mL Intracatheter Q12H  . vancomycin  500 mg Oral Q6H         Physical Exam: Vital signs in last 24 hours: Temp:  [98.2 F (36.8 C)-98.9 F (37.2 C)] 98.2 F (36.8 C) (07/15 0405) Pulse Rate:  [67-81] 70 (07/15 0405) Resp:  [18-20] 18 (07/15 0405) BP: (100-133)/(42-72) 118/72 (07/15 0405) SpO2:  [94 %-97 %] 96 % (07/15 0807) Weight:  [259 lb 14.8 oz (117.9 kg)] 259 lb 14.8 oz (117.9 kg) (07/14 1215) Weight change:  Last BM Date: 12/15/16  Intake/Output from previous day: 07/14 0701 - 07/15 0700 In: 2690 [P.O.:240; I.V.:2400; IV Piggyback:50] Out: 1850 [Urine:450] No intake/output data recorded.   Physical Exam: General- pt is awake,follows commands .  Resp- No acute REsp distress,NO Rhonchi CVS- S1S2 regular in rate and rhythm GIT- BS+, soft, NT, ND EXT- NO LE Edema, Cyanosis   Lab Results: CBC  Recent Labs  12/14/16 0459  WBC 10.8*  HGB 11.2*  HCT 32.5*  PLT 249    BMET  Recent Labs   12/15/16 0533 12/16/16 0648  NA 140 142  K 2.7* 2.9*  CL 106 105  CO2 23 26  GLUCOSE 188* 197*  BUN 54* 37*  CREATININE 4.95* 3.50*  CALCIUM 7.4* 7.7*   Creat trend 2018   In July 8.55==> 5.5( After HD) ==> 6.1==> 4.59( After HD)=>4.95==> 3.5 ( after HD) In June 1.5--4.0 In May 1.0--1.5 2016 1.2--1.5    MICRO Recent Results (from the past 240 hour(s))  Urine culture     Status: Abnormal   Collection Time: 12/10/16 10:55 AM  Result Value Ref Range Status   Specimen Description URINE, CLEAN CATCH  Final   Special Requests NONE  Final   Culture (A)  Final    <10,000 COLONIES/mL INSIGNIFICANT GROWTH Performed at [facility], [place] [address], [place], [place] [address]    Report Status 12/12/2016 FINAL  Final  Culture, blood (Routine x 2)     Status: None   Collection Time: 12/10/16 12:42 PM  Result Value Ref Range Status   Specimen Description BLOOD PORT DRAW  Final   Special Requests Blood Culture adequate volume  Final   Culture NO GROWTH 5 DAYS  Final   Report Status 12/15/2016 FINAL  Final  Culture, blood (Routine x 2)     Status:  None   Collection Time: 12/10/16 12:51 PM  Result Value Ref Range Status   Specimen Description BLOOD RIGHT ARM  Final   Special Requests   Final    BOTTLES DRAWN AEROBIC AND ANAEROBIC Blood Culture adequate volume   Culture NO GROWTH 5 DAYS  Final   Report Status 12/15/2016 FINAL  Final  MRSA PCR Screening     Status: None   Collection Time: 12/14/16  8:25 AM  Result Value Ref Range Status   MRSA by PCR NEGATIVE NEGATIVE Final    Comment:        The GeneXpert MRSA Assay (FDA approved for NASAL specimens only), is one component of a comprehensive MRSA colonization surveillance program. It is not intended to diagnose MRSA infection nor to guide or monitor treatment for MRSA infections.       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   5)ID- hx of recent C diff Primary MD following  6)Electrolytes Hypokalemic   Used 4 k bath yesterday  NOrmonatremic   7)Acid base Co2 at goal  8) CNS- admitted with AMS Now better Etiolology Uremia? Medications   Plan:  Will replte K gently Will change IVF as Bicarb much better  As Pt  Urine output is now better, Will follow o output/creat and then decide about need for tunneled cath     [name] S 12/16/2016, 9:51 AM

## 2016-12-16 NOTE — Progress Notes (Signed)
PROGRESS NOTE    Eric MEIDINGER  JEH:[id number] DOB: 1950-06-20 DOA: 12/10/2016 PCP: [name], MD    Brief Narrative:  66 year old male with history of chronic kidney disease, osteomyelitis on IV antibiotics, presented with alteration in mental status and was found to be unresponsive. In the ER, he was noted to have acute kidney injury and was hypothermic. He was admitted to the hospital and underwent dialysis. He is continued on antibiotics for osteomyelitis as well as recent diagnosis of C. difficile. Overall mental status is slowly improving.   Assessment & Plan:   Principal Problem:   Uremia Active Problems:   DM type 2 (diabetes mellitus, type 2) ([name])   HTN (hypertension)   C. difficile diarrhea   Hypokalemia   COPD with exacerbation ([name])   AKI (acute kidney injury) ([place])   Osteomyelitis ([name])   Volume depletion   Hypotension   1. Acute encephalopathy. Suspect this is related to medication effects in the setting of renal failure as well as uremia. Mental status continues to slowly improve. He is alert and oriented now. 2. Acute kidney injury. Started on dialysis per nephrology. Nephrology following. CK level unremarkable. Currently has temporary femoral dialysis catheter. Need for [place] term dialysis access per nephrology 3. Chronic osteomyelitis of the feet bilaterally. Currently on IV cefepime and daptomycin. Patient is followed by Dr. Sharol Given. Review of records indicate that Dr. Sharol Given had recommended that patient undergo right foot fifth ray amputation as well as left transtibial amputation. Patient wanted to wait and continue antibiotics for now. He will need to follow-up with orthopedics after discharge. 4. C. difficile. Antigen positive, toxin negative, PCR positive. Currently on oral vancomycin. Still has loose stools, but has not been eating anything solid. 5. Sinus tachycardia. Started on low-dose beta blocker. improved.  6. Metabolic acidosis. Related to renal  failure. Currently on bicarbonate infusion. Serum bicarbonate has improved. 7. Hypokalemia. Replacement ordered by nephrology. Magnesium has been corrected 8. Diabetes. Currently on sliding scale. Lantus on hold until po intake is more consistent. Blood sugars have been stable.   DVT prophylaxis: heparin Code Status: full code Family Communication: no family present Disposition Plan: Discharge to SNF when improved   Consultants:   Nephrology  General surgery for dialysis catheter placement  Procedures:     Antimicrobials:   daptomycin   Cefepime  Oral vancomycin   Subjective: Appears to be mildly more alert today. Still is somewhat confused. Denies any new complaints  Objective: Vitals:   12/15/16 2045 12/16/16 0405 12/16/16 0807 12/16/16 1316  BP: 122/62 118/72  (!) 152/78  Pulse: 74 70  71  Resp: 18 18  16   Temp: 98.2 F (36.8 C) 98.2 F (36.8 C)  97.8 F (36.6 C)  TempSrc: Oral Oral  Oral  SpO2: 95% 94% 96% 97%  Weight:      Height:        Intake/Output Summary (Last 24 hours) at 12/16/16 1537 Last data filed at 12/16/16 1316  Gross per 24 hour  Intake             2810 ml  Output             2550 ml  Net              260 ml   Filed Weights   12/13/16 1245 12/14/16 0500 12/15/16 1215  Weight: 115.5 kg (254 lb 10.1 oz) 117.9 kg (259 lb 14.8 oz) 117.9 kg (259 lb 14.8 oz)  Examination:  General exam: Alert, awake, disoriented Respiratory system: Clear to auscultation. Respiratory effort normal. Cardiovascular system:RRR. No murmurs, rubs, gallops. Gastrointestinal system: Abdomen is nondistended, soft and nontender. No organomegaly or masses felt. Normal bowel sounds heard. Central nervous system: No focal neurological deficits. Extremities: feet bilaterally are dressed with bandages Skin: No rashes, lesions or ulcers Psychiatry: confused, slow to respond      Data Reviewed: I have personally reviewed following labs and imaging  studies  CBC:  Recent Labs Lab 12/10/16 1150 12/11/16 0434 12/12/16 0422 12/14/16 0459  WBC 18.0* 14.9* 12.5* 10.8*  NEUTROABS 16.5*  --   --   --   HGB 12.5* 11.8* 11.5* 11.2*  HCT 35.9* 33.8* 32.8* 32.5*  MCV 94.7 94.2 93.7 93.9  PLT 308 290 273 035   Basic Metabolic Panel:  Recent Labs Lab 12/10/16 1851 12/11/16 0434 12/12/16 0422 12/13/16 0415 12/14/16 0459 12/15/16 0533 12/16/16 0648  NA 139 140 139 141 138 140 142  K 3.8 3.3* 2.9* 2.9* 3.2* 2.7* 2.9*  CL 112* 113* 107 109 106 106 105  CO2 12* 15* 21* 22 21* 23 26  GLUCOSE 192* 163* 225* 161* 208* 188* 197*  BUN 90* 72* 54* 65* 49* 54* 37*  CREATININE 8.55* 6.98* 5.59* 6.17* 4.59* 4.95* 3.50*  CALCIUM 7.9* 7.7* 7.7* 7.8* 7.5* 7.4* 7.7*  MG  --   --   --   --   --  1.4* 1.9  PHOS 8.4* 6.2* 4.7*  --   --   --   --    GFR: Estimated Creatinine Clearance: 27.5 mL/min (A) (by C-G formula based on SCr of 3.5 mg/dL (H)). Liver Function Tests:  Recent Labs Lab 12/10/16 1150 12/10/16 1851 12/11/16 0434 12/12/16 0422  AST 243*  --   --   --   ALT 226*  --   --   --   ALKPHOS 102  --   --   --   BILITOT 0.6  --   --   --   PROT 6.8  --   --   --   ALBUMIN 2.2* 2.0* 2.1* 2.0*   No results for input(s): LIPASE, AMYLASE in the last 168 hours.  Recent Labs Lab 12/10/16 1251 12/14/16 1157  AMMONIA 25 22   Coagulation Profile:  Recent Labs Lab 12/10/16 1150  INR 1.50   Cardiac Enzymes:  Recent Labs Lab 12/10/16 1056 12/11/16 1108 12/12/16 0700  CKTOTAL  --   --  314  CKMB  --  2  --   TROPONINI 0.04*  --   --    BNP (last 3 results) No results for input(s): PROBNP in the last 8760 hours. HbA1C: No results for input(s): HGBA1C in the last 72 hours. CBG:  Recent Labs Lab 12/15/16 1659 12/15/16 2045 12/16/16 0058 12/16/16 0447 12/16/16 0732  GLUCAP 175* 209* 183* 183* 223*   Lipid Profile: No results for input(s): CHOL, HDL, LDLCALC, TRIG, CHOLHDL, LDLDIRECT in the last 72  hours. Thyroid Function Tests: No results for input(s): TSH, T4TOTAL, FREET4, T3FREE, THYROIDAB in the last 72 hours. Anemia Panel: No results for input(s): VITAMINB12, FOLATE, FERRITIN, TIBC, IRON, RETICCTPCT in the last 72 hours. Sepsis Labs:  Recent Labs Lab 12/10/16 1214 12/10/16 1416  LATICACIDVEN 1.56 0.80    Recent Results (from the past 240 hour(s))  Urine culture     Status: Abnormal   Collection Time: 12/10/16 10:55 AM  Result Value Ref Range Status   Specimen Description URINE,  CLEAN CATCH  Final   Special Requests NONE  Final   Culture (A)  Final    <10,000 COLONIES/mL INSIGNIFICANT GROWTH Performed at [facility], [place] [address] [place], [place] [address]    Report Status 12/12/2016 FINAL  Final  Culture, blood (Routine x 2)     Status: None   Collection Time: 12/10/16 12:42 PM  Result Value Ref Range Status   Specimen Description BLOOD PORT DRAW  Final   Special Requests Blood Culture adequate volume  Final   Culture NO GROWTH 5 DAYS  Final   Report Status 12/15/2016 FINAL  Final  Culture, blood (Routine x 2)     Status: None   Collection Time: 12/10/16 12:51 PM  Result Value Ref Range Status   Specimen Description BLOOD RIGHT ARM  Final   Special Requests   Final    BOTTLES DRAWN AEROBIC AND ANAEROBIC Blood Culture adequate volume   Culture NO GROWTH 5 DAYS  Final   Report Status 12/15/2016 FINAL  Final  MRSA PCR Screening     Status: None   Collection Time: 12/14/16  8:25 AM  Result Value Ref Range Status   MRSA by PCR NEGATIVE NEGATIVE Final    Comment:        The GeneXpert MRSA Assay (FDA approved for NASAL specimens only), is one component of a comprehensive MRSA colonization surveillance program. It is not intended to diagnose MRSA infection nor to guide or monitor treatment for MRSA infections.          Radiology Studies: No results found.      Scheduled Meds: . chlorhexidine  15 mL Mouth Rinse BID  . Chlorhexidine  Gluconate Cloth  6 each Topical Daily  . Chlorhexidine Gluconate Cloth  6 each Topical Q0600  . collagenase   Topical Daily  . feeding supplement (NEPRO CARB STEADY)  237 mL Oral BID BM  . fluticasone furoate-vilanterol  1 puff Inhalation Daily  . heparin  5,000 Units Subcutaneous Q8H  . insulin aspart  0-9 Units Subcutaneous Q4H  . mouth rinse  15 mL Mouth Rinse q12n4p  . metoprolol tartrate  25 mg Oral BID  . nicotine  21 mg Transdermal Daily  . potassium chloride  20 mEq Oral Once  . saccharomyces boulardii  250 mg Oral BID  . sodium chloride flush  10-40 mL Intracatheter Q12H  . vancomycin  500 mg Oral Q6H   Continuous Infusions: . sodium chloride    . sodium chloride    . sodium chloride    . sodium chloride    . ceFEPime (MAXIPIME) IV Stopped (12/15/16 1618)  . DAPTOmycin (CUBICIN)  IV 800 mg (12/15/16 1808)  . dextrose 5 % and 0.45% NaCl 1,000 mL with sodium bicarbonate 50 mEq infusion 100 mL/hr at 12/16/16 1347     LOS: 6 days    Time spent: 62mins    MEMON,JEHANZEB, MD [facility] Pager [phone]  If 7PM-7AM, please contact night-coverage [email or web address] Password [facility] 12/16/2016, 3:37 PM

## 2016-12-17 LAB — GLUCOSE, CAPILLARY
GLUCOSE-CAPILLARY: 189 mg/dL — AB (ref 65–99)
GLUCOSE-CAPILLARY: 203 mg/dL — AB (ref 65–99)
GLUCOSE-CAPILLARY: 141 mg/dL — AB (ref 65–99)
Glucose-Capillary: 113 mg/dL — ABNORMAL HIGH (ref 65–99)
Glucose-Capillary: 145 mg/dL — ABNORMAL HIGH (ref 65–99)
Glucose-Capillary: 146 mg/dL — ABNORMAL HIGH (ref 65–99)
Glucose-Capillary: 148 mg/dL — ABNORMAL HIGH (ref 65–99)
Glucose-Capillary: 148 mg/dL — ABNORMAL HIGH (ref 65–99)

## 2016-12-17 LAB — BASIC METABOLIC PANEL
Anion gap: 13 (ref 5–15)
BUN: 44 mg/dL — ABNORMAL HIGH (ref 6–20)
CO2: 25 mmol/L (ref 22–32)
Calcium: 7.8 mg/dL — ABNORMAL LOW (ref 8.9–10.3)
Chloride: 107 mmol/L (ref 101–111)
Creatinine, Ser: 4.15 mg/dL — ABNORMAL HIGH (ref 0.61–1.24)
GFR calc Af Amer: 16 mL/min — ABNORMAL LOW (ref >60)
GFR calc non Af Amer: 14 mL/min — ABNORMAL LOW (ref >60)
Glucose, Bld: 147 mg/dL — ABNORMAL HIGH (ref 65–99)
Potassium: 3.4 mmol/L — ABNORMAL LOW (ref 3.5–5.1)
Sodium: 145 mmol/L (ref 135–145)

## 2016-12-17 MED ORDER — VANCOMYCIN 50 MG/ML ORAL SOLUTION
125.0000 mg | Freq: Four times a day (QID) | ORAL | Status: DC
  Administered 2016-12-17 – 2016-12-24 (×27): 125 mg via ORAL
  Filled 2016-12-17 (×30): qty 2.5

## 2016-12-17 NOTE — Progress Notes (Signed)
Subjective: Interval History: Patient presently off with no complaints. He denies any nausea or vomiting. Denies also any difficulty breathing.  Objective: Vital signs in last 24 hours: Temp:  [97.8 F (36.6 C)-98.2 F (36.8 C)] 97.9 F (36.6 C) (07/16 0615) Pulse Rate:  [68-77] 77 (07/16 0615) Resp:  [16] 16 (07/16 0615) BP: (123-152)/(69-78) 123/70 (07/16 0615) SpO2:  [95 %-98 %] 95 % (07/16 0615) Weight change:   Intake/Output from previous day: 07/15 0701 - 07/16 0700 In: 1362.3 [P.O.:360; I.V.:886.3; IV Piggyback:116] Out: 1350 [Urine:1350] Intake/Output this shift: No intake/output data recorded.  Patient is alert and in no apparent distress. Chest: Clear to auscultation Heart exam regular rate and rhythm Extremities no edema  Lab Results: No results for input(s): WBC, HGB, HCT, PLT in the last 72 hours. BMET:   Recent Labs  12/15/16 0533 12/16/16 0648  NA 140 142  K 2.7* 2.9*  CL 106 105  CO2 23 26  GLUCOSE 188* 197*  BUN 54* 37*  CREATININE 4.95* 3.50*  CALCIUM 7.4* 7.7*   No results for input(s): PTH in the last 72 hours. Iron Studies: No results for input(s): IRON, TIBC, TRANSFERRIN, FERRITIN in the last 72 hours.  Studies/Results: No results found.  I have reviewed the patient's current medications.  Assessment/Plan: Problem #1 altered mental status: Etiology as this moment is not clear. Possibly recommendation of medications and uremia. Patient is much better. Has returned to his baseline. Problem #2 acute kidney injury: His BUN and creatinine remains high. Possibly prerenal/ATN/AIN/ACE. His renal function seems to be improving. He has 1300 mL of output. Problem #3 low CO2 possibly metabolic: Has corrected. Problem #4 leukocytosis: Improving. Patient has a wound and also history of C. difficile colitis. Problem #5 hypertension: His blood pressure is reasonably controlled Problem #6 Bone and mineral disorder: His calcium and phosphorus is in  range. Problem #7 history of diabetes Problem #8 history of CVA Problem #9 hypokalemia: His potassium was 2.9 yesterday and improving. Patient on potassium supplement. Blood work is pending from today. Plan: 1] We'll continue his hydration 2] we'll check CBC and renal panel in the morning   LOS: 7 days   Sebastien Jackson S 12/17/2016,7:58 AM

## 2016-12-17 NOTE — Progress Notes (Signed)
PROGRESS NOTE    Eric Scott  KXF:[id number] DOB: 04-17-1951 DOA: 12/10/2016 PCP: [name], MD    Brief Narrative:  66 year old male with history of chronic kidney disease, osteomyelitis on IV antibiotics, presented with alteration in mental status and was found to be unresponsive. In the ER, he was noted to have acute kidney injury and was hypothermic. He was admitted to the hospital and underwent dialysis. He is continued on antibiotics for osteomyelitis as well as recent diagnosis of C. difficile. Overall mental status is slowly improving.   Assessment & Plan:   Principal Problem:   Uremia Active Problems:   DM type 2 (diabetes mellitus, type 2) ([name])   HTN (hypertension)   C. difficile diarrhea   Hypokalemia   COPD with exacerbation ([name])   AKI (acute kidney injury) ([place])   Osteomyelitis ([name])   Volume depletion   Hypotension   1. Acute encephalopathy. Suspect this is related to medication effects in the setting of renal failure as well as uremia. Mental status continues to slowly improve. He is alert and oriented now. 2. Acute kidney injury. Started on dialysis per nephrology. Nephrology following. CK level unremarkable. Currently has temporary femoral dialysis catheter. Urine output improving, but creatinine still trending up today when compared to yesterday. Need for [place] term dialysis access per nephrology 3. Chronic osteomyelitis of the feet bilaterally. Currently on IV cefepime and daptomycin. Patient is followed by Dr. Sharol Given. Review of records indicate that Dr. Sharol Given had recommended that patient undergo right foot fifth ray amputation as well as left transtibial amputation. Patient wanted to wait and continue antibiotics for now. Discussed with patient today and he wants to see Dr. Sharol Given in office before coming to a decision regarding surgery. Discussed in detail with Dr. Linus Salmons regarding antibiotics. Patient has received almost 6 weeks of IV antibiotics. Since he has had  adverse effects (C diff and renal failure) and does not appear to have any systemic symptoms, recommendations were to discontinue IV antibiotics at this time. Argument could be made to start the patient on doxycycline for suppressive therapy until he follows with orthopedics, but with on going issues with C diff, this was not recommended at this time. Close follow up with orthopedics will be arranged. 4. C. difficile. Antigen positive, toxin negative, PCR positive. Currently on oral vancomycin. Still has loose stools, but has not been eating anything solid. Discussed with Dr. Linus Salmons and recommendations were for another 2 weeks of vancomycin 5. Sinus tachycardia. Started on low-dose beta blocker. Improved.  6. Metabolic acidosis. Related to renal failure. Improved with bicarbonate infusion. 7. Hypokalemia. Replacement ordered by nephrology. Magnesium has been corrected 8. Diabetes. Currently on sliding scale. Lantus on hold until po intake is more consistent. Blood sugars have been stable.   DVT prophylaxis: heparin Code Status: full code Family Communication: no family present Disposition Plan: Discharge to SNF when improved   Consultants:   Nephrology  General surgery for dialysis catheter placement   Procedures:   Femoral dialysis catheter placement 7/9  Antimicrobials:   daptomycin   Cefepime  Oral vancomycin   Subjective: Appetite remains poor. Pain in back is better  Objective: Vitals:   12/16/16 2226 12/17/16 0615 12/17/16 0855 12/17/16 1300  BP: (!) 144/69 123/70  120/68  Pulse: 68 77  78  Resp: 16 16  18   Temp: 98.2 F (36.8 C) 97.9 F (36.6 C)  98.1 F (36.7 C)  TempSrc: Oral Oral  Oral  SpO2: 98% 95% 98% 97%  Weight:      Height:        Intake/Output Summary (Last 24 hours) at 12/17/16 1556 Last data filed at 12/17/16 1300  Gross per 24 hour  Intake          1482.25 ml  Output              650 ml  Net           832.25 ml   Filed Weights   12/13/16  1245 12/14/16 0500 12/15/16 1215  Weight: 115.5 kg (254 lb 10.1 oz) 117.9 kg (259 lb 14.8 oz) 117.9 kg (259 lb 14.8 oz)    Examination:  General exam: Alert, awake, no distress Respiratory system: Clear to auscultation. Respiratory effort normal. Cardiovascular system:RRR. No murmurs, rubs, gallops. Gastrointestinal system: Abdomen is nondistended, soft and nontender. No organomegaly or masses felt. Normal bowel sounds heard. Central nervous system: No focal neurological deficits. Extremities: feet bilaterally are dressed with bandages, no edema  Skin: wounds on left heel and right 5th digit do not have any surrounding erythema, drainage and have pink base. Psychiatry: awake, alert, pleasant      Data Reviewed: I have personally reviewed following labs and imaging studies  CBC:  Recent Labs Lab 12/11/16 0434 12/12/16 0422 12/14/16 0459  WBC 14.9* 12.5* 10.8*  HGB 11.8* 11.5* 11.2*  HCT 33.8* 32.8* 32.5*  MCV 94.2 93.7 93.9  PLT 290 273 956   Basic Metabolic Panel:  Recent Labs Lab 12/10/16 1851 12/11/16 0434 12/12/16 0422 12/13/16 0415 12/14/16 0459 12/15/16 0533 12/16/16 0648 12/17/16 0735  NA 139 140 139 141 138 140 142 145  K 3.8 3.3* 2.9* 2.9* 3.2* 2.7* 2.9* 3.4*  CL 112* 113* 107 109 106 106 105 107  CO2 12* 15* 21* 22 21* 23 26 25   GLUCOSE 192* 163* 225* 161* 208* 188* 197* 147*  BUN 90* 72* 54* 65* 49* 54* 37* 44*  CREATININE 8.55* 6.98* 5.59* 6.17* 4.59* 4.95* 3.50* 4.15*  CALCIUM 7.9* 7.7* 7.7* 7.8* 7.5* 7.4* 7.7* 7.8*  MG  --   --   --   --   --  1.4* 1.9  --   PHOS 8.4* 6.2* 4.7*  --   --   --   --   --    GFR: Estimated Creatinine Clearance: 23.2 mL/min (A) (by C-G formula based on SCr of 4.15 mg/dL (H)). Liver Function Tests:  Recent Labs Lab 12/10/16 1851 12/11/16 0434 12/12/16 0422  ALBUMIN 2.0* 2.1* 2.0*   No results for input(s): LIPASE, AMYLASE in the last 168 hours.  Recent Labs Lab 12/14/16 1157  AMMONIA 22   Coagulation  Profile: No results for input(s): INR, PROTIME in the last 168 hours. Cardiac Enzymes:  Recent Labs Lab 12/11/16 1108 12/12/16 0700  CKTOTAL  --  314  CKMB 2  --    BNP (last 3 results) No results for input(s): PROBNP in the last 8760 hours. HbA1C: No results for input(s): HGBA1C in the last 72 hours. CBG:  Recent Labs Lab 12/16/16 2043 12/17/16 0027 12/17/16 0500 12/17/16 0732 12/17/16 1111  GLUCAP 166* 148* 145* 148* 141*   Lipid Profile: No results for input(s): CHOL, HDL, LDLCALC, TRIG, CHOLHDL, LDLDIRECT in the last 72 hours. Thyroid Function Tests: No results for input(s): TSH, T4TOTAL, FREET4, T3FREE, THYROIDAB in the last 72 hours. Anemia Panel: No results for input(s): VITAMINB12, FOLATE, FERRITIN, TIBC, IRON, RETICCTPCT in the last 72 hours. Sepsis Labs: No results for input(s): PROCALCITON, LATICACIDVEN  in the last 168 hours.  Recent Results (from the past 240 hour(s))  Urine culture     Status: Abnormal   Collection Time: 12/10/16 10:55 AM  Result Value Ref Range Status   Specimen Description URINE, CLEAN CATCH  Final   Special Requests NONE  Final   Culture (A)  Final    <10,000 COLONIES/mL INSIGNIFICANT GROWTH Performed at [facility], [address], [place], [place] [address]    Report Status 12/12/2016 FINAL  Final  Culture, blood (Routine x 2)     Status: None   Collection Time: 12/10/16 12:42 PM  Result Value Ref Range Status   Specimen Description BLOOD PORT DRAW  Final   Special Requests Blood Culture adequate volume  Final   Culture NO GROWTH 5 DAYS  Final   Report Status 12/15/2016 FINAL  Final  Culture, blood (Routine x 2)     Status: None   Collection Time: 12/10/16 12:51 PM  Result Value Ref Range Status   Specimen Description BLOOD RIGHT ARM  Final   Special Requests   Final    BOTTLES DRAWN AEROBIC AND ANAEROBIC Blood Culture adequate volume   Culture NO GROWTH 5 DAYS  Final   Report Status 12/15/2016 FINAL  Final  MRSA  PCR Screening     Status: None   Collection Time: 12/14/16  8:25 AM  Result Value Ref Range Status   MRSA by PCR NEGATIVE NEGATIVE Final    Comment:        The GeneXpert MRSA Assay (FDA approved for NASAL specimens only), is one component of a comprehensive MRSA colonization surveillance program. It is not intended to diagnose MRSA infection nor to guide or monitor treatment for MRSA infections.          Radiology Studies: No results found.      Scheduled Meds: . chlorhexidine  15 mL Mouth Rinse BID  . Chlorhexidine Gluconate Cloth  6 each Topical Daily  . collagenase   Topical Daily  . feeding supplement (NEPRO CARB STEADY)  237 mL Oral BID BM  . fluticasone furoate-vilanterol  1 puff Inhalation Daily  . heparin  5,000 Units Subcutaneous Q8H  . insulin aspart  0-9 Units Subcutaneous Q4H  . mouth rinse  15 mL Mouth Rinse q12n4p  . metoprolol tartrate  25 mg Oral BID  . nicotine  21 mg Transdermal Daily  . saccharomyces boulardii  250 mg Oral BID  . sodium chloride flush  10-40 mL Intracatheter Q12H  . vancomycin  125 mg Oral Q6H   Continuous Infusions: . sodium chloride    . sodium chloride    . sodium chloride    . sodium chloride    . sodium chloride 75 mL/hr at 12/17/16 0744     LOS: 7 days    Time spent: 48mins    MEMON,JEHANZEB, MD [facility] Pager [phone]  If 7PM-7AM, please contact night-coverage [email or web address] Password [facility] 12/17/2016, 3:56 PM

## 2016-12-18 ENCOUNTER — Inpatient Hospital Stay (HOSPITAL_COMMUNITY): Payer: Medicare Other

## 2016-12-18 DIAGNOSIS — I82621 Acute embolism and thrombosis of deep veins of right upper extremity: Secondary | ICD-10-CM | POA: Diagnosis not present

## 2016-12-18 LAB — RENAL FUNCTION PANEL
ANION GAP: 11 (ref 5–15)
Albumin: 1.9 g/dL — ABNORMAL LOW (ref 3.5–5.0)
BUN: 50 mg/dL — ABNORMAL HIGH (ref 6–20)
CALCIUM: 7.6 mg/dL — AB (ref 8.9–10.3)
CO2: 25 mmol/L (ref 22–32)
Chloride: 104 mmol/L (ref 101–111)
Creatinine, Ser: 4.71 mg/dL — ABNORMAL HIGH (ref 0.61–1.24)
GFR, EST AFRICAN AMERICAN: 14 mL/min — AB (ref >60)
GFR, EST NON AFRICAN AMERICAN: 12 mL/min — AB (ref >60)
Glucose, Bld: 139 mg/dL — ABNORMAL HIGH (ref 65–99)
PHOSPHORUS: 3.7 mg/dL (ref 2.5–4.6)
Potassium: 3.2 mmol/L — ABNORMAL LOW (ref 3.5–5.1)
SODIUM: 140 mmol/L (ref 135–145)

## 2016-12-18 LAB — GLUCOSE, CAPILLARY
GLUCOSE-CAPILLARY: 126 mg/dL — AB (ref 65–99)
GLUCOSE-CAPILLARY: 132 mg/dL — AB (ref 65–99)
GLUCOSE-CAPILLARY: 134 mg/dL — AB (ref 65–99)
GLUCOSE-CAPILLARY: 177 mg/dL — AB (ref 65–99)
Glucose-Capillary: 142 mg/dL — ABNORMAL HIGH (ref 65–99)
Glucose-Capillary: 191 mg/dL — ABNORMAL HIGH (ref 65–99)
Glucose-Capillary: 140 mg/dL — ABNORMAL HIGH (ref 65–99)

## 2016-12-18 MED ORDER — ADULT MULTIVITAMIN W/MINERALS CH
1.0000 | ORAL_TABLET | Freq: Every day | ORAL | Status: DC
  Administered 2016-12-18 – 2016-12-24 (×7): 1 via ORAL
  Filled 2016-12-18 (×7): qty 1

## 2016-12-18 MED ORDER — ENSURE ENLIVE PO LIQD
237.0000 mL | Freq: Three times a day (TID) | ORAL | Status: DC
  Administered 2016-12-19 – 2016-12-24 (×11): 237 mL via ORAL

## 2016-12-18 MED ORDER — HEPARIN BOLUS VIA INFUSION
4000.0000 [IU] | Freq: Once | INTRAVENOUS | Status: AC
  Administered 2016-12-18: 4000 [IU] via INTRAVENOUS
  Filled 2016-12-18: qty 4000

## 2016-12-18 MED ORDER — HEPARIN (PORCINE) IN NACL 100-0.45 UNIT/ML-% IJ SOLN
1700.0000 [IU]/h | INTRAMUSCULAR | Status: DC
  Administered 2016-12-18 – 2016-12-19 (×2): 1700 [IU]/h via INTRAVENOUS
  Filled 2016-12-18 (×2): qty 250

## 2016-12-18 NOTE — Progress Notes (Signed)
ANTICOAGULATION CONSULT NOTE - Initial Consult  Pharmacy Consult for Heparin Indication: DVT  Allergies  Allergen Reactions  . Blueberry Flavor   . Cucumber Extract Other (See Comments)    "Fells like I'm having a heart attack"  . Flexeril [Cyclobenzaprine Hcl] Other (See Comments)    "whole body  Tremors" (05/27/2012)  . Kiwi Extract Other (See Comments)    "feels like I'm having a heart attack"    Patient Measurements: Height: 6' (182.9 cm) Weight: 259 lb 14.8 oz (117.9 kg) IBW/kg (Calculated) : 77.6 HEPARIN DW (KG): 102.6  Vital Signs: Temp: 98 F (36.7 C) (07/17 1507) Temp Source: Oral (07/17 1507) BP: 149/69 (07/17 1507) Pulse Rate: 82 (07/17 1507)  Labs:  Recent Labs  12/16/16 0648 12/17/16 0735 12/18/16 0740  CREATININE 3.50* 4.15* 4.71*    Estimated Creatinine Clearance: 20.4 mL/min (A) (by C-G formula based on SCr of 4.71 mg/dL (H)).   Medical History: Past Medical History:  Diagnosis Date  . Anxiety   . Chronic pain    legs, back; MRI 05/2012 with mild thoracic degenerative changes no spinal stenosis   . COPD (chronic obstructive pulmonary disease) ([place])   . Depression   . Diabetic peripheral neuropathy ([place])    "chronic" (05/27/2012)  . Diastolic CHF ([place])   . Hypercholesteremia   . Hypertension   . Peripheral edema   . PVD (peripheral vascular disease) ([place])   . Spinal stenosis    mild lumbar (MRI 05/2012)-L2-L3 to L4-L5 , mild lumbar foraminal stenosis   . Stroke ([place]) 05/2012   Subacute, lacunar infarcts within the left basal ganglia and posterior limp of the left internal capsule/thalamus; "RUE; both feet weak" (05/27/2012)  . Tremor   . Type II diabetes mellitus ([name])     Medications:  Prescriptions Prior to Admission  Medication Sig Dispense Refill Last Dose  . albuterol (PROVENTIL) (2.5 MG/3ML) 0.083% nebulizer solution Take 2.5 mg by nebulization every 3 (three) hours as needed for wheezing or shortness of breath.   unknown  .  ALPRAZolam (XANAX) 1 MG tablet Take 1 tablet (1 mg total) by mouth 3 (three) times daily. 30 tablet 0 12/10/2016 at Unknown time  . BREO ELLIPTA 100-25 MCG/INH AEPB    12/10/2016 at Unknown time  . ceFEPIme 2 g in dextrose 5 % 50 mL Inject 2 g into the vein every 12 (twelve) hours. Monitor labs and make dose adjustments per pharmacy protocol and input.   12/09/2016 at Unknown time  . citalopram (CELEXA) 20 MG tablet Take 20 mg by mouth daily.   12/10/2016 at Unknown time  . DAPTOmycin 800 mg in sodium chloride 0.9 % 100 mL Inject 800 mg into the vein daily.   12/09/2016 at Unknown time  . fish oil-omega-3 fatty acids 1000 MG capsule Take 1 g by mouth 2 (two) times daily.   12/10/2016 at Unknown time  . gabapentin (NEURONTIN) 800 MG tablet Take 800 mg by mouth 3 (three) times daily.   12/10/2016 at Unknown time  . HUMALOG KWIKPEN 100 UNIT/ML KiwkPen Inject 2-14 Units into the skin 4 (four) times daily -  before meals and at bedtime. Per sliding scale 150-200= 2 units 201-250= 4 units 251-300= 6 units 301-349= 8 units 350-400=10units 401-450=12units 451-500=14units ABOVE 501- CALL MD   12/10/2016 at Unknown time  . ipratropium-albuterol (DUONEB) 0.5-2.5 (3) MG/3ML SOLN Inhale 3 mLs into the lungs 3 (three) times daily.    12/10/2016 at Unknown time  . L-Methylfolate-Algae-B12-B6 (METANX) 3-90.314-2-35 MG CAPS Take  1 capsule by mouth 3 (three) times daily.   12/10/2016 at Unknown time  . l-methylfolate-B6-B12 (METANX) 3-35-2 MG TABS tablet Take 1 tablet by mouth 2 (two) times daily.   12/10/2016 at Unknown time  . LANTUS SOLOSTAR 100 UNIT/ML Solostar Pen 24 Units daily at 10 pm.    12/09/2016 at Unknown time  . NON FORMULARY Apply 1 application topically 2 (two) times daily. 14 day course starting on 12/07/2016  GREER GOO   unknown  . oxyCODONE (OXY IR/ROXICODONE) 5 MG immediate release tablet Take 1 tablet (5 mg total) by mouth every 8 (eight) hours as needed for severe pain. 6 tablet 0 Past Week at Unknown time  .  saccharomyces boulardii (FLORASTOR) 250 MG capsule Take 1 capsule (250 mg total) by mouth 3 (three) times daily.   12/10/2016 at Unknown time  . vancomycin (VANCOCIN) 50 mg/mL oral solution Take 2.5 mLs (125 mg total) by mouth 4 (four) times daily.   12/10/2016 at Unknown time  . Vitamin D, Ergocalciferol, (DRISDOL) 50000 units CAPS capsule Take 50,000 Units by mouth every 30 (thirty) days.   Past Month at Unknown time  . collagenase (SANTYL) ointment Apply topically daily. Apply Santyl to left heel and right outer foot Q day, then cover with moist gauze, then kerlex and tape. (Patient not taking: Reported on 12/10/2016)   Not Taking at Unknown time    Assessment: 66 year old male with history of chronic kidney disease, osteomyelitis of feet on IV antibiotics .IV antibiotics for osteomyelitis have been discontinued. Pt had a PICC line in place for administration of antibiotics and has developed a Right upper extremity DVT diagnosed on 7/17. Plan is to start the patient on intravenous heparin. Once adequate peripheral access has been secured, PICC line should be removed.   Goal of Therapy:  Heparin level 0.3-0.7 units/ml Monitor platelets by anticoagulation protocol: Yes   Plan:  Give 4000 units bolus x 1 Start heparin infusion at 1700 units/hr Check anti-Xa level in approximately 8 hours and daily while on heparin Continue to monitor H&H and platelets  Trenton Gammon, Taydem Cavagnaro L 12/18/2016,6:55 PM

## 2016-12-18 NOTE — Progress Notes (Signed)
Nutrition Follow-up  DOCUMENTATION CODES:  Pt meets criteria for severe MALNUTRITION in the context of acute kidney injury as evidenced by energy intake </= 50% for >/= 5 days and 8% wt loss in 1 month.     INTERVENTION:  D/c Nepro BID   Add Ensure Enlive po TID, each supplement provides 350 kcal and 20 grams of protein    NUTRITION DIAGNOSIS:  Malnutrition (severe/ acute) related to AKI as evidenced by unplanned wt loss 8% 1 month and energy intake </= 50% for >/= 5 days.  Inadequate oral intake related to lethargy/confusion, acute illness as evidenced by meal completion < 25%, percent weight loss and volume depleted on admission.   GOAL:   Patient will meet greater than or equal to 90% of their needs   MONITOR:   Supplement acceptance   ASSESSMENT:   Patient is a 66 yo male who presents unresponsive, volume depleted with urosepsis, metabolic acidosis. He has been living at [facility] for past 2 years.  Mr Gaunt has transitioned out of the ICU and his diet is fully advanced but his appetite and po intake remain poor no more than 0-50% of meals since admission. At lunch today he has only taken a few bites of food. He is much more alert and conversant compared to last visit. He eats regular food at [facility] and tells me he really likes living there. Feeds himself. Denies chewing problems but says, "food just doesn't taste right".  Discussed alternate food options with him and encouraged him to let our staff know what he prefers for meals.  His wt is up 3# in less than 1 week and he does have some edema which is likely contributing factor given his po intake is low.  Nutrition-Focused physical exam findings are mild fat depletion, moderate muscle depletion, and moderate edema.    Recent Labs Lab 12/12/16 0422  12/15/16 0533 12/16/16 0648 12/17/16 0735 12/18/16 0740  NA 139  < > 140 142 145 140  K 2.9*  < > 2.7* 2.9* 3.4* 3.2*  CL 107  < > 106 105 107 104  CO2 21*  < > 23 26 25 25    BUN 54*  < > 54* 37* 44* 50*  CREATININE 5.59*  < > 4.95* 3.50* 4.15* 4.71*  CALCIUM 7.7*  < > 7.4* 7.7* 7.8* 7.6*  MG  --   --  1.4* 1.9  --   --   PHOS 4.7*  --   --   --   --  3.7  GLUCOSE 225*  < > 188* 197* 147* 139*  < > = values in this interval not displayed.   Labs: Potassium 3.2, BUN 50, Cr 4.71, Albumin 1.9 - Mag and Phos WNL  Diet Order:  Diet heart healthy/carb modified Room service appropriate? Yes; Fluid consistency: Thin  Skin:   MASD- buttocks, sacrum, scrotum. Edema to right hand.  Last BM:  7/15 (mushy)  rectal tube removed on 7/14  Height:   Ht Readings from Last 1 Encounters:  12/10/16 6' (1.829 m)    Weight:   Wt Readings from Last 1 Encounters:  12/15/16 259 lb 14.8 oz (117.9 kg)  7/11- 257.09# (116.6 kg)  Ideal Body Weight:  81 kg  BMI:  Body mass index is 35.25 kg/m.  Estimated Nutritional Needs:   Kcal: 2200-2400  Protein:  120-140 gr  Fluid:  per MD goals   Colman Cater MS,RD,CSG,LDN Office: [phone] Pager: [phone]

## 2016-12-18 NOTE — Care Management Note (Signed)
Case Management Note  Patient Details  Name: Eric Scott MRN: [id number] Date of Birth: [birth year]  If discussed at [facility] Length of Stay Meetings, dates discussed:  12/18/2016  Additional Comments:  Elver Stadler, Chauncey Reading, RN 12/18/2016, 10:27 AM

## 2016-12-18 NOTE — Clinical Social Work Note (Signed)
LCSW provided status update to facility advising that patient may discharge tomorrow.      Nakeita Styles, Clydene Pugh, LCSW

## 2016-12-18 NOTE — Progress Notes (Signed)
PROGRESS NOTE    AWESOME JARED  EXB:[id number] DOB: 04/07/1951 DOA: 12/10/2016 PCP: [name], MD    Brief Narrative:  66 year old male with history of chronic kidney disease, osteomyelitis of feet on IV antibiotics, presented with alteration in mental status and was found to be unresponsive. In the ER, he was noted to have acute kidney injury and was hypothermic. It was felt that uremia may be contributing to his mental status. He was admitted to the hospital and underwent urgent dialysis. He was also recently found to have C. difficile and is being treated with oral vancomycin. Mental status has improved throughout hospitalization. IV antibiotics for osteomyelitis have been discontinued. He will need outpatient follow-up with orthopedics regarding need for surgical management of his feet. Currently, decision has been made for possible tunneled dialysis catheter placement if renal function does not continue to recover. He was also found to have right upper extremity DVT and has been started on anticoagulation. Once these issues have been addressed, plan is to discharge to skilled nursing facility.   Assessment & Plan:   Principal Problem:   Uremia Active Problems:   DM type 2 (diabetes mellitus, type 2) ([name])   HTN (hypertension)   C. difficile diarrhea   Hypokalemia   COPD with exacerbation ([name])   AKI (acute kidney injury) ([place])   Osteomyelitis ([name])   Volume depletion   Hypotension   Arm DVT (deep venous thromboembolism), acute, right ([place])   1. Acute encephalopathy. Suspect this is related to medication effects in the setting of renal failure as well as uremia. Mental status continues to slowly improve. He is alert and oriented now. 2. Acute kidney injury. Started on dialysis per nephrology. CK level unremarkable. Patient had temporary femoral dialysis catheter placement 7/9. Since being started on intermittent dialysis, urine output improving. Femoral catheter removed today.  Unfortunately, creatinine continues to trend up. Discuss with nephrology and he may need tunneled catheter placement if renal function does not recover.  3. Chronic osteomyelitis of the feet bilaterally. Currently on IV cefepime and daptomycin. Patient is followed by Dr. Sharol Given. Review of records indicate that Dr. Sharol Given had recommended that patient undergo right foot fifth ray amputation as well as left transtibial amputation. Patient wanted to wait and continue antibiotics for now. Discussed with patient today and he wants to see Dr. Sharol Given in office before coming to a decision regarding surgery. Discussed in detail with Dr. Linus Salmons regarding antibiotics. Patient has received almost 6 weeks of IV antibiotics. Since he has had adverse effects (C diff and renal failure) and does not appear to have any systemic symptoms, recommendations were to discontinue IV antibiotics at this time. Argument could be made to start the patient on doxycycline for suppressive therapy until he follows up with orthopedics, but with on going issues with C diff, this was not recommended at this time. Follow-up with orthopedics (Dr. Sharol Given) has been arranged on 7/26 at 9:15 AM. 4. C. difficile. Antigen positive, toxin negative, PCR positive. Currently on oral vancomycin. Overall diarrhea improving. Discussed with Dr. Linus Salmons and recommendations were for another 2 weeks of vancomycin. This will be complete on 7/30 5. Sinus tachycardia. Started on low-dose beta blocker. Improved.  6. Metabolic acidosis. Related to renal failure. Transiently on bicarbonate infusion. Resolved 7. Hypokalemia. Replacement ordered by nephrology. Magnesium has been corrected 8. Diabetes. Currently on sliding scale. Lantus on hold until po intake is more consistent. Blood sugars have been stable. 9. Right upper extremity DVT. Diagnosed on 7/17.  Related to PICC line. Start the patient on intravenous heparin. Once adequate peripheral access has been secured, PICC line  should be removed. On discharge, he can be prescribed NOAC dependent on renal function   DVT prophylaxis: heparin infusion Code Status: full code Family Communication: no family present Disposition Plan: Discharge to SNF when improved   Consultants:   Nephrology  General surgery for dialysis catheter placement   Procedures:   Femoral dialysis catheter placement 7/9>>7/16  Antimicrobials:   daptomycin discontinued on 7/16  Cefepime discontinued on 7/16  Oral vancomycin continued until 7/30   Subjective: Diarrhea improving. No shortness of breath. Overall feeling better.  Objective: Vitals:   12/17/16 2112 12/18/16 0500 12/18/16 0821 12/18/16 1507  BP: (!) 143/68 (!) 152/80  (!) 149/69  Pulse: 78 75  82  Resp: 18 18  18   Temp: 98 F (36.7 C)   98 F (36.7 C)  TempSrc: Oral   Oral  SpO2: 99% 95% 100% 100%  Weight:      Height:        Intake/Output Summary (Last 24 hours) at 12/18/16 1819 Last data filed at 12/18/16 1400  Gross per 24 hour  Intake           2622.5 ml  Output              850 ml  Net           1772.5 ml   Filed Weights   12/13/16 1245 12/14/16 0500 12/15/16 1215  Weight: 115.5 kg (254 lb 10.1 oz) 117.9 kg (259 lb 14.8 oz) 117.9 kg (259 lb 14.8 oz)    Examination:  General exam: Alert, awake, no distress Respiratory system: Clear to auscultation. Respiratory effort normal. Cardiovascular system:RRR. No murmurs, rubs, gallops. Gastrointestinal system: Abdomen is nondistended, soft and nontender. No organomegaly or masses felt. Normal bowel sounds heard. Central nervous system: No focal neurological deficits. Extremities: Right upper extremity more edematous than left Skin: wounds on left heel and right 5th digit do not have any surrounding erythema, drainage and have pink base. Psychiatry: awake, alert, pleasant  Data Reviewed: I have personally reviewed following labs and imaging studies  CBC:  Recent Labs Lab 12/12/16 0422  12/14/16 0459  WBC 12.5* 10.8*  HGB 11.5* 11.2*  HCT 32.8* 32.5*  MCV 93.7 93.9  PLT 273 557   Basic Metabolic Panel:  Recent Labs Lab 12/12/16 0422  12/14/16 0459 12/15/16 0533 12/16/16 0648 12/17/16 0735 12/18/16 0740  NA 139  < > 138 140 142 145 140  K 2.9*  < > 3.2* 2.7* 2.9* 3.4* 3.2*  CL 107  < > 106 106 105 107 104  CO2 21*  < > 21* 23 26 25 25   GLUCOSE 225*  < > 208* 188* 197* 147* 139*  BUN 54*  < > 49* 54* 37* 44* 50*  CREATININE 5.59*  < > 4.59* 4.95* 3.50* 4.15* 4.71*  CALCIUM 7.7*  < > 7.5* 7.4* 7.7* 7.8* 7.6*  MG  --   --   --  1.4* 1.9  --   --   PHOS 4.7*  --   --   --   --   --  3.7  < > = values in this interval not displayed. GFR: Estimated Creatinine Clearance: 20.4 mL/min (A) (by C-G formula based on SCr of 4.71 mg/dL (H)). Liver Function Tests:  Recent Labs Lab 12/12/16 0422 12/18/16 0740  ALBUMIN 2.0* 1.9*   No results for input(s): LIPASE,  AMYLASE in the last 168 hours.  Recent Labs Lab 12/14/16 1157  AMMONIA 22   Coagulation Profile: No results for input(s): INR, PROTIME in the last 168 hours. Cardiac Enzymes:  Recent Labs Lab 12/12/16 0700  CKTOTAL 314   BNP (last 3 results) No results for input(s): PROBNP in the last 8760 hours. HbA1C: No results for input(s): HGBA1C in the last 72 hours. CBG:  Recent Labs Lab 12/18/16 0034 12/18/16 0450 12/18/16 0736 12/18/16 1118 12/18/16 1539  GLUCAP 134* 126* 132* 142* 191*   Lipid Profile: No results for input(s): CHOL, HDL, LDLCALC, TRIG, CHOLHDL, LDLDIRECT in the last 72 hours. Thyroid Function Tests: No results for input(s): TSH, T4TOTAL, FREET4, T3FREE, THYROIDAB in the last 72 hours. Anemia Panel: No results for input(s): VITAMINB12, FOLATE, FERRITIN, TIBC, IRON, RETICCTPCT in the last 72 hours. Sepsis Labs: No results for input(s): PROCALCITON, LATICACIDVEN in the last 168 hours.  Recent Results (from the past 240 hour(s))  Urine culture     Status: Abnormal    Collection Time: 12/10/16 10:55 AM  Result Value Ref Range Status   Specimen Description URINE, CLEAN CATCH  Final   Special Requests NONE  Final   Culture (A)  Final    <10,000 COLONIES/mL INSIGNIFICANT GROWTH Performed at [facility], [address], [place], [place] [address]    Report Status 12/12/2016 FINAL  Final  Culture, blood (Routine x 2)     Status: None   Collection Time: 12/10/16 12:42 PM  Result Value Ref Range Status   Specimen Description BLOOD PORT DRAW  Final   Special Requests Blood Culture adequate volume  Final   Culture NO GROWTH 5 DAYS  Final   Report Status 12/15/2016 FINAL  Final  Culture, blood (Routine x 2)     Status: None   Collection Time: 12/10/16 12:51 PM  Result Value Ref Range Status   Specimen Description BLOOD RIGHT ARM  Final   Special Requests   Final    BOTTLES DRAWN AEROBIC AND ANAEROBIC Blood Culture adequate volume   Culture NO GROWTH 5 DAYS  Final   Report Status 12/15/2016 FINAL  Final  MRSA PCR Screening     Status: None   Collection Time: 12/14/16  8:25 AM  Result Value Ref Range Status   MRSA by PCR NEGATIVE NEGATIVE Final    Comment:        The GeneXpert MRSA Assay (FDA approved for NASAL specimens only), is one component of a comprehensive MRSA colonization surveillance program. It is not intended to diagnose MRSA infection nor to guide or monitor treatment for MRSA infections.          Radiology Studies: US Venous Img Upper Uni Right  Result Date: 12/18/2016 CLINICAL DATA:  Right upper extremity edema. History of PICC line placement. History of diabetes. Evaluate for DVT. EXAM: RIGHT UPPER EXTREMITY VENOUS DOPPLER ULTRASOUND TECHNIQUE: Gray-scale sonography with graded compression, as well as color Doppler and duplex ultrasound were performed to evaluate the upper extremity deep venous system from the level of the subclavian vein and including the jugular, axillary, basilic, radial, ulnar and upper cephalic vein.  Spectral Doppler was utilized to evaluate flow at rest and with distal augmentation maneuvers. COMPARISON:  None. FINDINGS: Contralateral Subclavian Vein: Respiratory phasicity is normal and symmetric with the symptomatic side. No evidence of thrombus. Normal compressibility. Internal Jugular Vein: No evidence of thrombus. Normal compressibility, respiratory phasicity and response to augmentation. There is hypoechoic occlusive thrombus within the right subclavian  vein (image 13) extending to involve the axillary vein (image 17), 1 of the paired brachial veins (image 21). The additional paired brachial vein appears patent where imaged (representative image 22). There is hypoechoic occlusive thrombus within the right basilic vein which is noted containing portion of the hit line (image 24). The radial, ulnar and cephalic veins appear patent where imaged. Other Findings:  None visualized. IMPRESSION: Examination is positive for presumed PICC line associated occlusive DVT and superficial thrombophlebitis involving the PICC line containing basilic vein, one of the paired brachial veins as well as the right axillary and imaged portions of the right subclavian vein. Electronically Signed   By: Sandi Mariscal M.D.   On: 12/18/2016 15:15        Scheduled Meds: . chlorhexidine  15 mL Mouth Rinse BID  . Chlorhexidine Gluconate Cloth  6 each Topical Daily  . collagenase   Topical Daily  . feeding supplement (ENSURE ENLIVE)  237 mL Oral TID BM  . fluticasone furoate-vilanterol  1 puff Inhalation Daily  . heparin  5,000 Units Subcutaneous Q8H  . insulin aspart  0-9 Units Subcutaneous Q4H  . mouth rinse  15 mL Mouth Rinse q12n4p  . metoprolol tartrate  25 mg Oral BID  . multivitamin with minerals  1 tablet Oral Daily  . nicotine  21 mg Transdermal Daily  . saccharomyces boulardii  250 mg Oral BID  . sodium chloride flush  10-40 mL Intracatheter Q12H  . vancomycin  125 mg Oral Q6H   Continuous Infusions: .  sodium chloride    . sodium chloride    . sodium chloride    . sodium chloride    . sodium chloride 75 mL/hr at 12/18/16 1021     LOS: 8 days    Time spent: 63mins    Jermiah Howton, MD [facility] Pager [phone]  If 7PM-7AM, please contact night-coverage [email or web address] Password [facility] 12/18/2016, 6:19 PM

## 2016-12-18 NOTE — Progress Notes (Signed)
Subjective: Interval History: Patient denies any nausea or vomiting. He denies also any difficulty breathing.  Objective: Vital signs in last 24 hours: Temp:  [98 F (36.7 C)-98.1 F (36.7 C)] 98 F (36.7 C) (07/16 2112) Pulse Rate:  [75-78] 75 (07/17 0500) Resp:  [18] 18 (07/17 0500) BP: (120-152)/(68-80) 152/80 (07/17 0500) SpO2:  [95 %-99 %] 95 % (07/17 0500) Weight change:   Intake/Output from previous day: 07/16 0701 - 07/17 0700 In: 720 [P.O.:720] Out: 350 [Urine:350] Intake/Output this shift: No intake/output data recorded.  Patient is alert and in no apparent distress. Chest: Clear to auscultation Heart exam regular rate and rhythm Extremities no edema  Lab Results: No results for input(s): WBC, HGB, HCT, PLT in the last 72 hours. BMET:   Recent Labs  12/16/16 0648 12/17/16 0735  NA 142 145  K 2.9* 3.4*  CL 105 107  CO2 26 25  GLUCOSE 197* 147*  BUN 37* 44*  CREATININE 3.50* 4.15*  CALCIUM 7.7* 7.8*   No results for input(s): PTH in the last 72 hours. Iron Studies: No results for input(s): IRON, TIBC, TRANSFERRIN, FERRITIN in the last 72 hours.  Studies/Results: No results found.  I have reviewed the patient's current medications.  Assessment/Plan: Problem #1 altered mental status: Patient has improved. Problem #2 acute kidney injury: Patient is nonoliguric. His creatinine however remains high. Presently patient doesn't have any uremic signs and symptoms.Renal panel is pending from today. Problem #3 low CO2 possibly metabolic: Has corrected. Problem #4 history of C. difficile colitis. Patient presently denies any more diarrhea. He denies also any abdominal pain. Problem #5 hypertension: His blood pressure is reasonably controlled Problem #6 Bone and mineral disorder: His calcium and phosphorus is in range. Problem #7 history of diabetes Problem #8 history of CVA Problem #9 hypokalemia: His potassium is improving. Plan: 1] We'll continue his  hydration 2] we'll remove his femoral dialysis catheter today . If patient requires dialysis we'll may need to send him for tunneled catheter placement and continue dialysis as an outpatient. 3] we'll check CBC and renal panel in the morning   LOS: 8 days   Paden Senger S 12/18/2016,7:51 AM

## 2016-12-19 DIAGNOSIS — R4182 Altered mental status, unspecified: Secondary | ICD-10-CM

## 2016-12-19 DIAGNOSIS — I82621 Acute embolism and thrombosis of deep veins of right upper extremity: Secondary | ICD-10-CM

## 2016-12-19 DIAGNOSIS — E1140 Type 2 diabetes mellitus with diabetic neuropathy, unspecified: Secondary | ICD-10-CM

## 2016-12-19 DIAGNOSIS — E876 Hypokalemia: Secondary | ICD-10-CM

## 2016-12-19 DIAGNOSIS — N179 Acute kidney failure, unspecified: Principal | ICD-10-CM

## 2016-12-19 DIAGNOSIS — N19 Unspecified kidney failure: Secondary | ICD-10-CM

## 2016-12-19 DIAGNOSIS — I10 Essential (primary) hypertension: Secondary | ICD-10-CM

## 2016-12-19 LAB — GLUCOSE, CAPILLARY
GLUCOSE-CAPILLARY: 160 mg/dL — AB (ref 65–99)
GLUCOSE-CAPILLARY: 172 mg/dL — AB (ref 65–99)
Glucose-Capillary: 159 mg/dL — ABNORMAL HIGH (ref 65–99)
Glucose-Capillary: 169 mg/dL — ABNORMAL HIGH (ref 65–99)
Glucose-Capillary: 188 mg/dL — ABNORMAL HIGH (ref 65–99)
Glucose-Capillary: 202 mg/dL — ABNORMAL HIGH (ref 65–99)

## 2016-12-19 LAB — RENAL FUNCTION PANEL
ALBUMIN: 2 g/dL — AB (ref 3.5–5.0)
ANION GAP: 9 (ref 5–15)
BUN: 66 mg/dL — AB (ref 6–20)
CALCIUM: 7.7 mg/dL — AB (ref 8.9–10.3)
CO2: 26 mmol/L (ref 22–32)
Chloride: 105 mmol/L (ref 101–111)
Creatinine, Ser: 4.71 mg/dL — ABNORMAL HIGH (ref 0.61–1.24)
GFR calc Af Amer: 14 mL/min — ABNORMAL LOW (ref >60)
GFR calc non Af Amer: 12 mL/min — ABNORMAL LOW (ref >60)
GLUCOSE: 179 mg/dL — AB (ref 65–99)
Phosphorus: 3.3 mg/dL (ref 2.5–4.6)
Potassium: 3.9 mmol/L (ref 3.5–5.1)
SODIUM: 140 mmol/L (ref 135–145)

## 2016-12-19 LAB — CBC WITH DIFFERENTIAL/PLATELET
Basophils Absolute: 0 10*3/uL (ref 0.0–0.1)
Basophils Relative: 0 %
EOS ABS: 0.3 10*3/uL (ref 0.0–0.7)
EOS PCT: 2 %
HCT: 21 % — ABNORMAL LOW (ref 39.0–52.0)
HEMOGLOBIN: 7.1 g/dL — AB (ref 13.0–17.0)
LYMPHS ABS: 2.5 10*3/uL (ref 0.7–4.0)
Lymphocytes Relative: 16 %
MCH: 32.9 pg (ref 26.0–34.0)
MCHC: 33.8 g/dL (ref 30.0–36.0)
MCV: 97.2 fL (ref 78.0–100.0)
MONOS PCT: 6 %
Monocytes Absolute: 0.9 10*3/uL (ref 0.1–1.0)
NEUTROS PCT: 75 %
Neutro Abs: 11.4 10*3/uL — ABNORMAL HIGH (ref 1.7–7.7)
Platelets: 251 10*3/uL (ref 150–400)
RBC: 2.16 MIL/uL — AB (ref 4.22–5.81)
RDW: 15.5 % (ref 11.5–15.5)
WBC: 15.2 10*3/uL — AB (ref 4.0–10.5)

## 2016-12-19 LAB — CBC
HEMATOCRIT: 25.1 % — AB (ref 39.0–52.0)
HEMOGLOBIN: 8.5 g/dL — AB (ref 13.0–17.0)
MCH: 33.1 pg (ref 26.0–34.0)
MCHC: 33.9 g/dL (ref 30.0–36.0)
MCV: 97.7 fL (ref 78.0–100.0)
Platelets: 210 10*3/uL (ref 150–400)
RBC: 2.57 MIL/uL — AB (ref 4.22–5.81)
RDW: 15.4 % (ref 11.5–15.5)
WBC: 13.4 10*3/uL — ABNORMAL HIGH (ref 4.0–10.5)

## 2016-12-19 LAB — OCCULT BLOOD X 1 CARD TO LAB, STOOL: Fecal Occult Bld: POSITIVE — AB

## 2016-12-19 LAB — HEPARIN LEVEL (UNFRACTIONATED)
Heparin Unfractionated: 0.35 IU/mL (ref 0.30–0.70)
Heparin Unfractionated: 0.37 IU/mL (ref 0.30–0.70)

## 2016-12-19 NOTE — Progress Notes (Signed)
Wounds on both feet cleansed and dressing applied per order.  Patient tolerated well.

## 2016-12-19 NOTE — Progress Notes (Signed)
Patient's stool dark in color - almost black.  Dr. Cathlean Sauer notified via text page.

## 2016-12-19 NOTE — Progress Notes (Signed)
[place] for Heparin Indication: DVT  Allergies  Allergen Reactions  . Blueberry Flavor   . Cucumber Extract Other (See Comments)    "Fells like I'm having a heart attack"  . Flexeril [Cyclobenzaprine Hcl] Other (See Comments)    "whole body  Tremors" (05/27/2012)  . Kiwi Extract Other (See Comments)    "feels like I'm having a heart attack"    Patient Measurements: Height: 6' (182.9 cm) Weight: 259 lb 14.8 oz (117.9 kg) IBW/kg (Calculated) : 77.6 HEPARIN DW (KG): 102.6  Vital Signs: Temp: 97.1 F (36.2 C) (07/18 0616) BP: 150/66 (07/18 0616) Pulse Rate: 81 (07/18 0616)  Labs:  Recent Labs  12/17/16 0735 12/18/16 0740 12/19/16 0638  HGB  --   --  8.5*  HCT  --   --  25.1*  PLT  --   --  210  HEPARINUNFRC  --   --  0.35  CREATININE 4.15* 4.71* 4.71*    Estimated Creatinine Clearance: 20.4 mL/min (A) (by C-G formula based on SCr of 4.71 mg/dL (H)).   Medical History: Past Medical History:  Diagnosis Date  . Anxiety   . Chronic pain    legs, back; MRI 05/2012 with mild thoracic degenerative changes no spinal stenosis   . COPD (chronic obstructive pulmonary disease) ([place])   . Depression   . Diabetic peripheral neuropathy ([place])    "chronic" (05/27/2012)  . Diastolic CHF ([place])   . Hypercholesteremia   . Hypertension   . Peripheral edema   . PVD (peripheral vascular disease) ([place])   . Spinal stenosis    mild lumbar (MRI 05/2012)-L2-L3 to L4-L5 , mild lumbar foraminal stenosis   . Stroke ([place]) 05/2012   Subacute, lacunar infarcts within the left basal ganglia and posterior limp of the left internal capsule/thalamus; "RUE; both feet weak" (05/27/2012)  . Tremor   . Type II diabetes mellitus ([name])     Medications:  Prescriptions Prior to Admission  Medication Sig Dispense Refill Last Dose  . albuterol (PROVENTIL) (2.5 MG/3ML) 0.083% nebulizer solution Take 2.5 mg by nebulization every 3 (three) hours as needed for  wheezing or shortness of breath.   unknown  . ALPRAZolam (XANAX) 1 MG tablet Take 1 tablet (1 mg total) by mouth 3 (three) times daily. 30 tablet 0 12/10/2016 at Unknown time  . BREO ELLIPTA 100-25 MCG/INH AEPB    12/10/2016 at Unknown time  . ceFEPIme 2 g in dextrose 5 % 50 mL Inject 2 g into the vein every 12 (twelve) hours. Monitor labs and make dose adjustments per pharmacy protocol and input.   12/09/2016 at Unknown time  . citalopram (CELEXA) 20 MG tablet Take 20 mg by mouth daily.   12/10/2016 at Unknown time  . DAPTOmycin 800 mg in sodium chloride 0.9 % 100 mL Inject 800 mg into the vein daily.   12/09/2016 at Unknown time  . fish oil-omega-3 fatty acids 1000 MG capsule Take 1 g by mouth 2 (two) times daily.   12/10/2016 at Unknown time  . gabapentin (NEURONTIN) 800 MG tablet Take 800 mg by mouth 3 (three) times daily.   12/10/2016 at Unknown time  . HUMALOG KWIKPEN 100 UNIT/ML KiwkPen Inject 2-14 Units into the skin 4 (four) times daily -  before meals and at bedtime. Per sliding scale 150-200= 2 units 201-250= 4 units 251-300= 6 units 301-349= 8 units 350-400=10units 401-450=12units 451-500=14units ABOVE 501- CALL MD   12/10/2016 at Unknown time  . ipratropium-albuterol (DUONEB)  0.5-2.5 (3) MG/3ML SOLN Inhale 3 mLs into the lungs 3 (three) times daily.    12/10/2016 at Unknown time  . L-Methylfolate-Algae-B12-B6 (METANX) 3-90.314-2-35 MG CAPS Take 1 capsule by mouth 3 (three) times daily.   12/10/2016 at Unknown time  . l-methylfolate-B6-B12 (METANX) 3-35-2 MG TABS tablet Take 1 tablet by mouth 2 (two) times daily.   12/10/2016 at Unknown time  . LANTUS SOLOSTAR 100 UNIT/ML Solostar Pen 24 Units daily at 10 pm.    12/09/2016 at Unknown time  . NON FORMULARY Apply 1 application topically 2 (two) times daily. 14 day course starting on 12/07/2016  GREER GOO   unknown  . oxyCODONE (OXY IR/ROXICODONE) 5 MG immediate release tablet Take 1 tablet (5 mg total) by mouth every 8 (eight) hours as needed for severe pain.  6 tablet 0 Past Week at Unknown time  . saccharomyces boulardii (FLORASTOR) 250 MG capsule Take 1 capsule (250 mg total) by mouth 3 (three) times daily.   12/10/2016 at Unknown time  . vancomycin (VANCOCIN) 50 mg/mL oral solution Take 2.5 mLs (125 mg total) by mouth 4 (four) times daily.   12/10/2016 at Unknown time  . Vitamin D, Ergocalciferol, (DRISDOL) 50000 units CAPS capsule Take 50,000 Units by mouth every 30 (thirty) days.   Past Month at Unknown time  . collagenase (SANTYL) ointment Apply topically daily. Apply Santyl to left heel and right outer foot Q day, then cover with moist gauze, then kerlex and tape. (Patient not taking: Reported on 12/10/2016)   Not Taking at Unknown time    Assessment: 66 year old male with history of chronic kidney disease, osteomyelitis of feet on IV antibiotics .IV antibiotics for osteomyelitis have been discontinued. Pt had a PICC line in place for administration of antibiotics and has developed a Right upper extremity DVT diagnosed on 7/17. Plan is to start the patient on intravenous heparin. Once adequate peripheral access has been secured, PICC line should be removed. Heparin level this AM is therapeutic at 0.35  Goal of Therapy:  Heparin level 0.3-0.7 units/ml Monitor platelets by anticoagulation protocol: Yes   Plan:  Continue heparin infusion at 1700 units/hr Check anti-Xa level in approximately 8 hours and daily while on heparin Continue to monitor H&H and platelets  Isac Sarna, BS [place], BCPS Clinical Pharmacist Pager [phone] 12/19/2016,8:48 AM

## 2016-12-19 NOTE — Progress Notes (Signed)
Patient was sourced for blood/body fluids exposure to a staff member.  Per hospital policy.  Blood was drawn for the exposure panel.

## 2016-12-19 NOTE — Progress Notes (Signed)
PROGRESS NOTE    Eric Scott  OXB:[id number] DOB: Sep 26, 1950 DOA: 12/10/2016 PCP: [name], MD    Brief Narrative: 66 year old male who presented with altered mental status. Patient is known to have anxiety, chronic pain syndrome, osteomyelitis, diabetes mellitus type 2, peripheral vascular disease and prior history of CVA. Patient presented to hospital pain unresponsive. His initial vital signs blood pressure 112/65, heart rate 106, respiratory rate 18, oxygen saturation 96%. Moist mucous membranes, lungs clear to auscultation bilaterally, heart S1-S2 present rhythmic, the abdomen was soft nontender, no lower extremity edema. Chemistry with significant renal failure. Patient had urgent hemodialysis. His hospitalization was complicated by C. difficile and right upper extremity deep vein thrombosis.   Assessment & Plan:   Principal Problem:   Uremia Active Problems:   DM type 2 (diabetes mellitus, type 2) ([name])   HTN (hypertension)   C. difficile diarrhea   Hypokalemia   COPD with exacerbation ([name])   AKI (acute kidney injury) ([place])   Osteomyelitis ([name])   Volume depletion   Hypotension   Arm DVT (deep venous thromboembolism), acute, right ([place])   1. AKI on CKD stage V. Renal function with stable serum cr at 4,7, no signs of volume overload, serum K at 3,9 with bicarbonate at 26. Urine output 826 cc over last 24 hours. Case discussed with nephrology, will continue close observation of renal function and electrolytes. Holding HD for now.   2. C diff. Reported improved diarrhea. Will continue PO vancomycin, will follow further response.  3. Chronic osteomyelitis. Patient has completed antibiotic therapy, plan to follow with orthopedics as outpatient for further evaluation, physical therapy evaluation.   4. Right upper extremity DVT associated with PICC line. Will continue anticoagulation with IV heparin, will hold on oral agents, for now, unclear if patient will need HD access  before discharge.   5. T2DM. Will continue glucose cover and monitoring with ISS capillary glucose 177, 160, 188, 172.   6. Hypertension. Will continue with metoprolol po with good toleration. Blood pressure systolic 683 to 419.    DVT prophylaxis: heparin IV  Code Status: full  Family Communication:  Disposition Plan:    Consultants:     Procedures:     Antimicrobials:     Subjective: Patient feeling well, no nausea or vomiting, no chest pain or dyspnea, positive weakness.   Objective: Vitals:   12/18/16 2024 12/19/16 0616 12/19/16 0747 12/19/16 0859  BP: (!) 179/64 (!) 150/66    Pulse: 74 81  89  Resp: 20 18    Temp: 98.1 F (36.7 C) (!) 97.1 F (36.2 C)    TempSrc: Oral     SpO2: 97% 98% 99%   Weight:      Height:        Intake/Output Summary (Last 24 hours) at 12/19/16 0928 Last data filed at 12/19/16 0020  Gross per 24 hour  Intake           2922.5 ml  Output              826 ml  Net           2096.5 ml   Filed Weights   12/13/16 1245 12/14/16 0500 12/15/16 1215  Weight: 115.5 kg (254 lb 10.1 oz) 117.9 kg (259 lb 14.8 oz) 117.9 kg (259 lb 14.8 oz)    Examination:  General exam: deconditioned E ENT: no pallor or icterus, oral mucosa moist.  Respiratory system: Clear to auscultation. Respiratory effort normal. No wheezing,  rales or rhonchi.  Cardiovascular system: S1 & S2 heard, RRR. No JVD, murmurs, rubs, gallops or clicks. Trace edema. Gastrointestinal system: Abdomen is nondistended, soft and nontender. No organomegaly or masses felt. Normal bowel sounds heard. Central nervous system: Alert and oriented. No focal neurological deficits. Extremities: Symmetric 5 x 5 power. Skin: No rashes, lesions or ulcers    Data Reviewed: I have personally reviewed following labs and imaging studies  CBC:  Recent Labs Lab 12/14/16 0459 12/19/16 0638  WBC 10.8* 13.4*  HGB 11.2* 8.5*  HCT 32.5* 25.1*  MCV 93.9 97.7  PLT 249 902   Basic Metabolic  Panel:  Recent Labs Lab 12/15/16 0533 12/16/16 0648 12/17/16 0735 12/18/16 0740 12/19/16 0638  NA 140 142 145 140 140  K 2.7* 2.9* 3.4* 3.2* 3.9  CL 106 105 107 104 105  CO2 23 26 25 25 26   GLUCOSE 188* 197* 147* 139* 179*  BUN 54* 37* 44* 50* 66*  CREATININE 4.95* 3.50* 4.15* 4.71* 4.71*  CALCIUM 7.4* 7.7* 7.8* 7.6* 7.7*  MG 1.4* 1.9  --   --   --   PHOS  --   --   --  3.7 3.3   GFR: Estimated Creatinine Clearance: 20.4 mL/min (A) (by C-G formula based on SCr of 4.71 mg/dL (H)). Liver Function Tests:  Recent Labs Lab 12/18/16 0740 12/19/16 4097  ALBUMIN 1.9* 2.0*   No results for input(s): LIPASE, AMYLASE in the last 168 hours.  Recent Labs Lab 12/14/16 1157  AMMONIA 22   Coagulation Profile: No results for input(s): INR, PROTIME in the last 168 hours. Cardiac Enzymes: No results for input(s): CKTOTAL, CKMB, CKMBINDEX, TROPONINI in the last 168 hours. BNP (last 3 results) No results for input(s): PROBNP in the last 8760 hours. HbA1C: No results for input(s): HGBA1C in the last 72 hours. CBG:  Recent Labs Lab 12/18/16 1539 12/18/16 2028 12/19/16 0002 12/19/16 0613 12/19/16 0754  GLUCAP 191* 140* 177* 160* 188*   Lipid Profile: No results for input(s): CHOL, HDL, LDLCALC, TRIG, CHOLHDL, LDLDIRECT in the last 72 hours. Thyroid Function Tests: No results for input(s): TSH, T4TOTAL, FREET4, T3FREE, THYROIDAB in the last 72 hours. Anemia Panel: No results for input(s): VITAMINB12, FOLATE, FERRITIN, TIBC, IRON, RETICCTPCT in the last 72 hours. Sepsis Labs: No results for input(s): PROCALCITON, LATICACIDVEN in the last 168 hours.  Recent Results (from the past 240 hour(s))  Urine culture     Status: Abnormal   Collection Time: 12/10/16 10:55 AM  Result Value Ref Range Status   Specimen Description URINE, CLEAN CATCH  Final   Special Requests NONE  Final   Culture (A)  Final    <10,000 COLONIES/mL INSIGNIFICANT GROWTH Performed at [facility], [address] [place], [place] [address]    Report Status 12/12/2016 FINAL  Final  Culture, blood (Routine x 2)     Status: None   Collection Time: 12/10/16 12:42 PM  Result Value Ref Range Status   Specimen Description BLOOD PORT DRAW  Final   Special Requests Blood Culture adequate volume  Final   Culture NO GROWTH 5 DAYS  Final   Report Status 12/15/2016 FINAL  Final  Culture, blood (Routine x 2)     Status: None   Collection Time: 12/10/16 12:51 PM  Result Value Ref Range Status   Specimen Description BLOOD RIGHT ARM  Final   Special Requests   Final    BOTTLES DRAWN AEROBIC AND ANAEROBIC Blood Culture adequate volume  Culture NO GROWTH 5 DAYS  Final   Report Status 12/15/2016 FINAL  Final  MRSA PCR Screening     Status: None   Collection Time: 12/14/16  8:25 AM  Result Value Ref Range Status   MRSA by PCR NEGATIVE NEGATIVE Final    Comment:        The GeneXpert MRSA Assay (FDA approved for NASAL specimens only), is one component of a comprehensive MRSA colonization surveillance program. It is not intended to diagnose MRSA infection nor to guide or monitor treatment for MRSA infections.          Radiology Studies: US Venous Img Upper Uni Right  Result Date: 12/18/2016 CLINICAL DATA:  Right upper extremity edema. History of PICC line placement. History of diabetes. Evaluate for DVT. EXAM: RIGHT UPPER EXTREMITY VENOUS DOPPLER ULTRASOUND TECHNIQUE: Gray-scale sonography with graded compression, as well as color Doppler and duplex ultrasound were performed to evaluate the upper extremity deep venous system from the level of the subclavian vein and including the jugular, axillary, basilic, radial, ulnar and upper cephalic vein. Spectral Doppler was utilized to evaluate flow at rest and with distal augmentation maneuvers. COMPARISON:  None. FINDINGS: Contralateral Subclavian Vein: Respiratory phasicity is normal and symmetric with the symptomatic side. No evidence of  thrombus. Normal compressibility. Internal Jugular Vein: No evidence of thrombus. Normal compressibility, respiratory phasicity and response to augmentation. There is hypoechoic occlusive thrombus within the right subclavian vein (image 13) extending to involve the axillary vein (image 17), 1 of the paired brachial veins (image 21). The additional paired brachial vein appears patent where imaged (representative image 22). There is hypoechoic occlusive thrombus within the right basilic vein which is noted containing portion of the hit line (image 24). The radial, ulnar and cephalic veins appear patent where imaged. Other Findings:  None visualized. IMPRESSION: Examination is positive for presumed PICC line associated occlusive DVT and superficial thrombophlebitis involving the PICC line containing basilic vein, one of the paired brachial veins as well as the right axillary and imaged portions of the right subclavian vein. Electronically Signed   By: Sandi Mariscal M.D.   On: 12/18/2016 15:15        Scheduled Meds: . chlorhexidine  15 mL Mouth Rinse BID  . Chlorhexidine Gluconate Cloth  6 each Topical Daily  . collagenase   Topical Daily  . feeding supplement (ENSURE ENLIVE)  237 mL Oral TID BM  . fluticasone furoate-vilanterol  1 puff Inhalation Daily  . insulin aspart  0-9 Units Subcutaneous Q4H  . mouth rinse  15 mL Mouth Rinse q12n4p  . metoprolol tartrate  25 mg Oral BID  . multivitamin with minerals  1 tablet Oral Daily  . nicotine  21 mg Transdermal Daily  . saccharomyces boulardii  250 mg Oral BID  . sodium chloride flush  10-40 mL Intracatheter Q12H  . vancomycin  125 mg Oral Q6H   Continuous Infusions: . sodium chloride    . sodium chloride    . sodium chloride    . sodium chloride    . sodium chloride Stopped (12/18/16 1900)  . heparin 1,700 Units/hr (12/19/16 0848)     LOS: 9 days        Treyvin Glidden Gerome Apley, MD [facility] Pager [phone]  If 7PM-7AM,  please contact night-coverage [email or web address] Password [facility] 12/19/2016, 9:28 AM

## 2016-12-19 NOTE — Progress Notes (Signed)
Wounds on both feet cleansed and dressed per order.  Patient tolerated well.

## 2016-12-19 NOTE — Progress Notes (Addendum)
[place] for Heparin Indication: DVT  Allergies  Allergen Reactions  . Blueberry Flavor   . Cucumber Extract Other (See Comments)    "Fells like I'm having a heart attack"  . Flexeril [Cyclobenzaprine Hcl] Other (See Comments)    "whole body  Tremors" (05/27/2012)  . Kiwi Extract Other (See Comments)    "feels like I'm having a heart attack"    Patient Measurements: Height: 6' (182.9 cm) Weight: 259 lb 14.8 oz (117.9 kg) IBW/kg (Calculated) : 77.6 HEPARIN DW (KG): 102.6  Vital Signs: Temp: 97.1 F (36.2 C) (07/18 0616) BP: 150/66 (07/18 0616) Pulse Rate: 89 (07/18 0859)  Labs:  Recent Labs  12/17/16 0735 12/18/16 0740 12/19/16 5102 12/19/16 1459  HGB  --   --  8.5*  --   HCT  --   --  25.1*  --   PLT  --   --  210  --   HEPARINUNFRC  --   --  0.35 0.37  CREATININE 4.15* 4.71* 4.71*  --     Estimated Creatinine Clearance: 20.4 mL/min (A) (by C-G formula based on SCr of 4.71 mg/dL (H)).   Medical History: Past Medical History:  Diagnosis Date  . Anxiety   . Chronic pain    legs, back; MRI 05/2012 with mild thoracic degenerative changes no spinal stenosis   . COPD (chronic obstructive pulmonary disease) ([place])   . Depression   . Diabetic peripheral neuropathy ([place])    "chronic" (05/27/2012)  . Diastolic CHF ([place])   . Hypercholesteremia   . Hypertension   . Peripheral edema   . PVD (peripheral vascular disease) ([place])   . Spinal stenosis    mild lumbar (MRI 05/2012)-L2-L3 to L4-L5 , mild lumbar foraminal stenosis   . Stroke ([place]) 05/2012   Subacute, lacunar infarcts within the left basal ganglia and posterior limp of the left internal capsule/thalamus; "RUE; both feet weak" (05/27/2012)  . Tremor   . Type II diabetes mellitus ([name])     Medications:  Prescriptions Prior to Admission  Medication Sig Dispense Refill Last Dose  . albuterol (PROVENTIL) (2.5 MG/3ML) 0.083% nebulizer solution Take 2.5 mg by nebulization  every 3 (three) hours as needed for wheezing or shortness of breath.   unknown  . ALPRAZolam (XANAX) 1 MG tablet Take 1 tablet (1 mg total) by mouth 3 (three) times daily. 30 tablet 0 12/10/2016 at Unknown time  . BREO ELLIPTA 100-25 MCG/INH AEPB    12/10/2016 at Unknown time  . ceFEPIme 2 g in dextrose 5 % 50 mL Inject 2 g into the vein every 12 (twelve) hours. Monitor labs and make dose adjustments per pharmacy protocol and input.   12/09/2016 at Unknown time  . citalopram (CELEXA) 20 MG tablet Take 20 mg by mouth daily.   12/10/2016 at Unknown time  . DAPTOmycin 800 mg in sodium chloride 0.9 % 100 mL Inject 800 mg into the vein daily.   12/09/2016 at Unknown time  . fish oil-omega-3 fatty acids 1000 MG capsule Take 1 g by mouth 2 (two) times daily.   12/10/2016 at Unknown time  . gabapentin (NEURONTIN) 800 MG tablet Take 800 mg by mouth 3 (three) times daily.   12/10/2016 at Unknown time  . HUMALOG KWIKPEN 100 UNIT/ML KiwkPen Inject 2-14 Units into the skin 4 (four) times daily -  before meals and at bedtime. Per sliding scale 150-200= 2 units 201-250= 4 units 251-300= 6 units 301-349= 8 units 350-400=10units 401-450=12units  451-500=14units ABOVE 501- CALL MD   12/10/2016 at Unknown time  . ipratropium-albuterol (DUONEB) 0.5-2.5 (3) MG/3ML SOLN Inhale 3 mLs into the lungs 3 (three) times daily.    12/10/2016 at Unknown time  . L-Methylfolate-Algae-B12-B6 (METANX) 3-90.314-2-35 MG CAPS Take 1 capsule by mouth 3 (three) times daily.   12/10/2016 at Unknown time  . l-methylfolate-B6-B12 (METANX) 3-35-2 MG TABS tablet Take 1 tablet by mouth 2 (two) times daily.   12/10/2016 at Unknown time  . LANTUS SOLOSTAR 100 UNIT/ML Solostar Pen 24 Units daily at 10 pm.    12/09/2016 at Unknown time  . NON FORMULARY Apply 1 application topically 2 (two) times daily. 14 day course starting on 12/07/2016  GREER GOO   unknown  . oxyCODONE (OXY IR/ROXICODONE) 5 MG immediate release tablet Take 1 tablet (5 mg total) by mouth every 8  (eight) hours as needed for severe pain. 6 tablet 0 Past Week at Unknown time  . saccharomyces boulardii (FLORASTOR) 250 MG capsule Take 1 capsule (250 mg total) by mouth 3 (three) times daily.   12/10/2016 at Unknown time  . vancomycin (VANCOCIN) 50 mg/mL oral solution Take 2.5 mLs (125 mg total) by mouth 4 (four) times daily.   12/10/2016 at Unknown time  . Vitamin D, Ergocalciferol, (DRISDOL) 50000 units CAPS capsule Take 50,000 Units by mouth every 30 (thirty) days.   Past Month at Unknown time  . collagenase (SANTYL) ointment Apply topically daily. Apply Santyl to left heel and right outer foot Q day, then cover with moist gauze, then kerlex and tape. (Patient not taking: Reported on 12/10/2016)   Not Taking at Unknown time    Assessment: 66 year old male with history of chronic kidney disease, osteomyelitis of feet on IV antibiotics .IV antibiotics for osteomyelitis have been discontinued. Pt had a PICC line in place for administration of antibiotics and has developed a Right upper extremity DVT diagnosed on 7/17. Plan is to start the patient on intravenous heparin. Once adequate peripheral access has been secured, PICC line should be removed. Heparin level remains therapeutic.   Goal of Therapy:  Heparin level 0.3-0.7 units/ml Monitor platelets by anticoagulation protocol: Yes   Plan:  Continue heparin infusion at 1700 units/hr Check anti-Xa level daily while on heparin Continue to monitor H&H and platelets  Isac Sarna, BS [place], BCPS Clinical Pharmacist Pager [phone] 12/19/2016,3:45 PM

## 2016-12-19 NOTE — Progress Notes (Signed)
Eric Scott  MRN: [id number]  DOB/AGE: 1951-01-27 66 y.o.  Primary Care Physician:Scott, Eric Dada, MD  Admit date: 12/10/2016  Chief Complaint:  Chief Complaint  Patient presents with  . Weakness    S-Pt presented on  12/10/2016 with  Chief Complaint  Patient presents with  . Weakness  .    Pt offer no new  complaints.     Meds . chlorhexidine  15 mL Mouth Rinse BID  . Chlorhexidine Gluconate Cloth  6 each Topical Daily  . collagenase   Topical Daily  . feeding supplement (ENSURE ENLIVE)  237 mL Oral TID BM  . fluticasone furoate-vilanterol  1 puff Inhalation Daily  . insulin aspart  0-9 Units Subcutaneous Q4H  . mouth rinse  15 mL Mouth Rinse q12n4p  . metoprolol tartrate  25 mg Oral BID  . multivitamin with minerals  1 tablet Oral Daily  . nicotine  21 mg Transdermal Daily  . saccharomyces boulardii  250 mg Oral BID  . sodium chloride flush  10-40 mL Intracatheter Q12H  . vancomycin  125 mg Oral Q6H         Physical Exam: Vital signs in last 24 hours: Temp:  [97.1 F (36.2 C)-98.1 F (36.7 C)] 97.1 F (36.2 C) (07/18 0616) Pulse Rate:  [74-89] 89 (07/18 0859) Resp:  [18-20] 18 (07/18 0616) BP: (149-179)/(64-69) 150/66 (07/18 0616) SpO2:  [97 %-100 %] 99 % (07/18 0747) Weight change:  Last BM Date: 12/19/16  Intake/Output from previous day: 07/17 0701 - 07/18 0700 In: 2922.5 [P.O.:240; I.V.:2682.5] Out: 826 [Urine:825; Stool:1] No intake/output data recorded.   Physical Exam: General- pt is awake,follows commands .  Resp- No acute REsp distress,NO Rhonchi CVS- S1S2 regular in rate and rhythm GIT- BS+, soft, NT, ND EXT- trace LE Edema, NO Cyanosis   Lab Results: CBC  Recent Labs  12/19/16 0638  WBC 13.4*  HGB 8.5*  HCT 25.1*  PLT 210    BMET  Recent Labs  12/18/16 0740 12/19/16 0638  NA 140 140  K 3.2* 3.9  CL 104 105  CO2 25 26  GLUCOSE 139* 179*  BUN 50* 66*  CREATININE 4.71* 4.71*  CALCIUM 7.6* 7.7*   Creat trend 2018    In July 8.55==> 5.5( After HD) ==> 6.1==> 4.59( After HD)=>4.95==> 3.5 ( after HD)==> 4.1=>4.7-4.7 In June 1.5--4.0 In May 1.0--1.5 2016 1.2--1.5    MICRO Recent Results (from the past 240 hour(s))  Urine culture     Status: Abnormal   Collection Time: 12/10/16 10:55 AM  Result Value Ref Range Status   Specimen Description URINE, CLEAN CATCH  Final   Special Requests NONE  Final   Culture (A)  Final    <10,000 COLONIES/mL INSIGNIFICANT GROWTH Performed at [facility], [place] [address], [place], [place] [address]    Report Status 12/12/2016 FINAL  Final  Culture, blood (Routine x 2)     Status: None   Collection Time: 12/10/16 12:42 PM  Result Value Ref Range Status   Specimen Description BLOOD PORT DRAW  Final   Special Requests Blood Culture adequate volume  Final   Culture NO GROWTH 5 DAYS  Final   Report Status 12/15/2016 FINAL  Final  Culture, blood (Routine x 2)     Status: None   Collection Time: 12/10/16 12:51 PM  Result Value Ref Range Status   Specimen Description BLOOD RIGHT ARM  Final   Special Requests   Final    BOTTLES  DRAWN AEROBIC AND ANAEROBIC Blood Culture adequate volume   Culture NO GROWTH 5 DAYS  Final   Report Status 12/15/2016 FINAL  Final  MRSA PCR Screening     Status: None   Collection Time: 12/14/16  8:25 AM  Result Value Ref Range Status   MRSA by PCR NEGATIVE NEGATIVE Final    Comment:        The GeneXpert MRSA Assay (FDA approved for NASAL specimens only), is one component of a comprehensive MRSA colonization surveillance program. It is not intended to diagnose MRSA infection nor to guide or monitor treatment for MRSA infections.       Lab Results  Component Value Date   CALCIUM 7.7 (L) 12/19/2016   PHOS 3.3 12/19/2016            Impression: 1)Renal  AKI secondary to ATN/AIN                AKI on CKD                CKD sec to DM/ Multiple AKi                               Pt required renal replacement therapy  sec to High BUN/ Mental status changes                  ATN                  Pt  Urine output is now now much better                  Pt creat now at plateau                  Will wait for another 24 hours, if creat starts trending down, will follow conservatively                 If creat trend higher/ volume issues- will ask for tunneled cath and plan to renal replacement therapy                        2)HTN Bp stable  3)Anemia HGb stable  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   5)ID- hx of recent C diff Primary MD following  6)Electrolytes Hypokalemic   Now better   NOrmonatremic   7)Acid base Co2 at goal  8) CNS- admitted with AMS Now better  9) DVT-Pt now with right upper ext DVT On anticoagulations    Plan:  Pt  Urine output is now now much better Pt creat now at plateau Will wait for another 24 hours, if creat starts trending down, will follow conservatively  If creat trend higher/ volume issues- will ask for tunneled cath and plan to renal replacement therapy      Mahi Zabriskie S 12/19/2016, 9:31 AM

## 2016-12-19 NOTE — Progress Notes (Addendum)
After speaking with Dr. Hal Hope, ordered CBC stat and for 0300. Hemoglobin came back at 7.1, Dr. Hal Hope paged again and made aware of hemoglobin. Waiting for orders.

## 2016-12-19 NOTE — Progress Notes (Signed)
Pts Fecal occult positive. Dr on call paged, waiting for orders

## 2016-12-19 NOTE — Care Management Important Message (Signed)
Important Message  Patient Details  Name: Eric Scott MRN: [id number] Date of Birth: May 08, 1951   Medicare Important Message Given:  Yes    Javonni Macke, Chauncey Reading, RN 12/19/2016, 10:05 AM

## 2016-12-19 NOTE — Progress Notes (Signed)
RN spoke with Dr Hal Hope and adv of pts hgb of 7.1. Order to changed next CBC to 0200 this morning as well as d/c heparin gtt. Will continue to monitor pt

## 2016-12-20 ENCOUNTER — Encounter (HOSPITAL_COMMUNITY): Payer: Self-pay | Admitting: Gastroenterology

## 2016-12-20 DIAGNOSIS — K922 Gastrointestinal hemorrhage, unspecified: Secondary | ICD-10-CM

## 2016-12-20 DIAGNOSIS — K2901 Acute gastritis with bleeding: Secondary | ICD-10-CM

## 2016-12-20 DIAGNOSIS — R740 Nonspecific elevation of levels of transaminase and lactic acid dehydrogenase [LDH]: Secondary | ICD-10-CM

## 2016-12-20 DIAGNOSIS — J441 Chronic obstructive pulmonary disease with (acute) exacerbation: Secondary | ICD-10-CM

## 2016-12-20 DIAGNOSIS — R7401 Elevation of levels of liver transaminase levels: Secondary | ICD-10-CM

## 2016-12-20 DIAGNOSIS — A0472 Enterocolitis due to Clostridium difficile, not specified as recurrent: Secondary | ICD-10-CM

## 2016-12-20 LAB — BASIC METABOLIC PANEL
ANION GAP: 12 (ref 5–15)
BUN: 98 mg/dL — AB (ref 6–20)
CHLORIDE: 108 mmol/L (ref 101–111)
CO2: 26 mmol/L (ref 22–32)
Calcium: 8.1 mg/dL — ABNORMAL LOW (ref 8.9–10.3)
Creatinine, Ser: 4.96 mg/dL — ABNORMAL HIGH (ref 0.61–1.24)
GFR calc Af Amer: 13 mL/min — ABNORMAL LOW (ref >60)
GFR, EST NON AFRICAN AMERICAN: 11 mL/min — AB (ref >60)
GLUCOSE: 250 mg/dL — AB (ref 65–99)
POTASSIUM: 4.3 mmol/L (ref 3.5–5.1)
Sodium: 146 mmol/L — ABNORMAL HIGH (ref 135–145)

## 2016-12-20 LAB — CBC WITH DIFFERENTIAL/PLATELET
BASOS ABS: 0 10*3/uL (ref 0.0–0.1)
Basophils Relative: 0 %
Eosinophils Absolute: 0.3 10*3/uL (ref 0.0–0.7)
Eosinophils Relative: 2 %
HEMATOCRIT: 19.6 % — AB (ref 39.0–52.0)
HEMOGLOBIN: 6.6 g/dL — AB (ref 13.0–17.0)
LYMPHS ABS: 2.4 10*3/uL (ref 0.7–4.0)
LYMPHS PCT: 17 %
MCH: 32.8 pg (ref 26.0–34.0)
MCHC: 33.7 g/dL (ref 30.0–36.0)
MCV: 97.5 fL (ref 78.0–100.0)
MONO ABS: 0.8 10*3/uL (ref 0.1–1.0)
Monocytes Relative: 6 %
NEUTROS ABS: 10.8 10*3/uL — AB (ref 1.7–7.7)
Neutrophils Relative %: 76 %
Platelets: 228 10*3/uL (ref 150–400)
RBC: 2.01 MIL/uL — AB (ref 4.22–5.81)
RDW: 15.6 % — ABNORMAL HIGH (ref 11.5–15.5)
WBC: 14.2 10*3/uL — AB (ref 4.0–10.5)

## 2016-12-20 LAB — CBC
HCT: 22.2 % — ABNORMAL LOW (ref 39.0–52.0)
HEMOGLOBIN: 7.5 g/dL — AB (ref 13.0–17.0)
MCH: 32.5 pg (ref 26.0–34.0)
MCHC: 33.8 g/dL (ref 30.0–36.0)
MCV: 96.1 fL (ref 78.0–100.0)
PLATELETS: 243 10*3/uL (ref 150–400)
RBC: 2.31 MIL/uL — AB (ref 4.22–5.81)
RDW: 16.9 % — ABNORMAL HIGH (ref 11.5–15.5)
WBC: 13.7 10*3/uL — AB (ref 4.0–10.5)

## 2016-12-20 LAB — GLUCOSE, CAPILLARY
GLUCOSE-CAPILLARY: 171 mg/dL — AB (ref 65–99)
GLUCOSE-CAPILLARY: 209 mg/dL — AB (ref 65–99)
Glucose-Capillary: 161 mg/dL — ABNORMAL HIGH (ref 65–99)
Glucose-Capillary: 194 mg/dL — ABNORMAL HIGH (ref 65–99)
Glucose-Capillary: 223 mg/dL — ABNORMAL HIGH (ref 65–99)
Glucose-Capillary: 242 mg/dL — ABNORMAL HIGH (ref 65–99)

## 2016-12-20 LAB — ABO/RH: ABO/RH(D): O NEG

## 2016-12-20 LAB — PREPARE RBC (CROSSMATCH)

## 2016-12-20 LAB — OCCULT BLOOD X 1 CARD TO LAB, STOOL
Fecal Occult Bld: POSITIVE — AB
Fecal Occult Bld: POSITIVE — AB

## 2016-12-20 MED ORDER — PANTOPRAZOLE SODIUM 40 MG IV SOLR
40.0000 mg | Freq: Two times a day (BID) | INTRAVENOUS | Status: DC
  Administered 2016-12-20 (×2): 40 mg via INTRAVENOUS
  Filled 2016-12-20 (×2): qty 40

## 2016-12-20 MED ORDER — SODIUM CHLORIDE 0.9 % IV SOLN
Freq: Once | INTRAVENOUS | Status: AC
  Administered 2016-12-20: 13:00:00 via INTRAVENOUS

## 2016-12-20 MED ORDER — SODIUM CHLORIDE 0.9 % IV SOLN
Freq: Once | INTRAVENOUS | Status: AC
  Administered 2016-12-20: 04:00:00 via INTRAVENOUS

## 2016-12-20 NOTE — Progress Notes (Signed)
PT Cancellation Note  Patient Details Name: Eric Scott MRN: [id number] DOB: 1950/09/15   Cancelled Treatment:    Reason Eval/Treat Not Completed: Patient not medically ready (Chart reviewed, treatment attempted: pt currently receving blood. Will attempt again at later date time. )  3:13 PM, 12/20/16 Etta Grandchild, PT, DPT Physical Therapist - [facility] [phone] (Office)    Daniele Dillow C 12/20/2016, 3:13 PM

## 2016-12-20 NOTE — Progress Notes (Signed)
PROGRESS NOTE    Eric Scott  MVE:[id number] DOB: 1950/09/04 DOA: 12/10/2016 PCP: [name], MD    Brief Narrative:  66 year old male who presented with altered mental status. Patient is known to have anxiety, chronic pain syndrome, osteomyelitis, diabetes mellitus type 2, peripheral vascular disease and prior history of CVA. Patient presented to hospital pain unresponsive. His initial vital signs blood pressure 112/65, heart rate 106, respiratory rate 18, oxygen saturation 96%. Moist mucous membranes, lungs clear to auscultation bilaterally, heart S1-S2 present rhythmic, the abdomen was soft nontender, no lower extremity edema. Chemistry with significant renal failure. Patient had urgent hemodialysis. His hospitalization was complicated by C. difficile and right upper extremity deep vein thrombosis.   Assessment & Plan:   Principal Problem:   Uremia Active Problems:   DM type 2 (diabetes mellitus, type 2) ([name])   HTN (hypertension)   C. difficile diarrhea   Hypokalemia   COPD with exacerbation ([name])   AKI (acute kidney injury) ([place])   Osteomyelitis ([name])   Volume depletion   Hypotension   Arm DVT (deep venous thromboembolism), acute, right ([place])   1. New GI bleeding likely upper vs lower. Will hold on anticoagulation, until hb and hematorcrit stable, will need blood transfusion 2 units PRBC, will follow on cell count in am. Add pantoprazole IV q12 hours. May need endoscopic procedure to evaluate the safety of resuming anticoagulation, for upper extremity dvt.   1. AKI on CKD stage V. Serum cr at 4,9 from 4,7, no signs of volume overload, serum K at 4,3 with bicarbonate at 26. Urine output 1,700 ml over last 24 hours. Will transfuse prbc and follow renal panel in am, avoid hypotension and nephrotoxic medications, no indication for HD at this time.   2. C diff. Continue PO vancomycin, WBC down to 13 from 14.   3. Chronic osteomyelitis. Off antibiotics, plan to follow as  outpatient with orthopedic surgery.    4. Right upper extremity DVT associated with PICC line. Patient developed GI bleeding with dark stools, hem occult positive, will have to hold on anticoagulation for now.   5. T2DM. Gucose cover and monitoring with ISS capillary glucose 169, 159, 161, 194, 242. Patient with poor appetite and poor oral intake.  6. Hypertension. On metoprolol po, blood pressure systolic 283 to 662.   DVT prophylaxis: heparin IV  Code Status: full  Family Communication:  Disposition Plan:    Consultants:     Procedures:     Antimicrobials:     Subjective: Patient had dark stools last night, positive for occult blood, no chest pain, dyspnea or abdominal pain, no nausea or vomiting, heparin was discontinued and had 1 unit prbc transfused.   Objective: Vitals:   12/20/16 0342 12/20/16 0415 12/20/16 0650 12/20/16 0845  BP: (!) 145/60 (!) 155/56 (!) 143/91   Pulse: 92 95 91   Resp: 20 18 20    Temp: 98 F (36.7 C) 98 F (36.7 C) 97.7 F (36.5 C)   TempSrc: Oral Oral Oral   SpO2: 96% 99% 97% 96%  Weight:      Height:        Intake/Output Summary (Last 24 hours) at 12/20/16 1004 Last data filed at 12/20/16 0650  Gross per 24 hour  Intake          1713.83 ml  Output             1700 ml  Net  13.83 ml   Filed Weights   12/13/16 1245 12/14/16 0500 12/15/16 1215  Weight: 115.5 kg (254 lb 10.1 oz) 117.9 kg (259 lb 14.8 oz) 117.9 kg (259 lb 14.8 oz)    Examination:  General exam: not in pain E ENT. Positive pallor, oral mucosa moist, no icterus.  Respiratory system: Clear to auscultation. Respiratory effort normal. No wheezing, rales or rhonchi.  Cardiovascular system: S1 & S2 heard, RRR. No JVD, murmurs, rubs, gallops or clicks. Trace lower extremity edema. Gastrointestinal system: Abdomen is nondistended, soft and nontender. No organomegaly or masses felt. Normal bowel sounds heard. Central nervous system: Alert and oriented.  No focal neurological deficits. Extremities: Symmetric 5 x 5 power. Skin: No rashes, lesions or ulcers    Data Reviewed: I have personally reviewed following labs and imaging studies  CBC:  Recent Labs Lab 12/14/16 0459 12/19/16 0638 12/19/16 2237 12/20/16 0131  WBC 10.8* 13.4* 15.2* 14.2*  NEUTROABS  --   --  11.4* 10.8*  HGB 11.2* 8.5* 7.1* 6.6*  HCT 32.5* 25.1* 21.0* 19.6*  MCV 93.9 97.7 97.2 97.5  PLT 249 210 251 017   Basic Metabolic Panel:  Recent Labs Lab 12/15/16 0533 12/16/16 0648 12/17/16 0735 12/18/16 0740 12/19/16 0638  NA 140 142 145 140 140  K 2.7* 2.9* 3.4* 3.2* 3.9  CL 106 105 107 104 105  CO2 23 26 25 25 26   GLUCOSE 188* 197* 147* 139* 179*  BUN 54* 37* 44* 50* 66*  CREATININE 4.95* 3.50* 4.15* 4.71* 4.71*  CALCIUM 7.4* 7.7* 7.8* 7.6* 7.7*  MG 1.4* 1.9  --   --   --   PHOS  --   --   --  3.7 3.3   GFR: Estimated Creatinine Clearance: 20.4 mL/min (A) (by C-G formula based on SCr of 4.71 mg/dL (H)). Liver Function Tests:  Recent Labs Lab 12/18/16 0740 12/19/16 7939  ALBUMIN 1.9* 2.0*   No results for input(s): LIPASE, AMYLASE in the last 168 hours.  Recent Labs Lab 12/14/16 1157  AMMONIA 22   Coagulation Profile: No results for input(s): INR, PROTIME in the last 168 hours. Cardiac Enzymes: No results for input(s): CKTOTAL, CKMB, CKMBINDEX, TROPONINI in the last 168 hours. BNP (last 3 results) No results for input(s): PROBNP in the last 8760 hours. HbA1C: No results for input(s): HGBA1C in the last 72 hours. CBG:  Recent Labs Lab 12/19/16 1605 12/19/16 1948 12/19/16 2357 12/20/16 0329 12/20/16 0756  GLUCAP 202* 169* 159* 161* 194*   Lipid Profile: No results for input(s): CHOL, HDL, LDLCALC, TRIG, CHOLHDL, LDLDIRECT in the last 72 hours. Thyroid Function Tests: No results for input(s): TSH, T4TOTAL, FREET4, T3FREE, THYROIDAB in the last 72 hours. Anemia Panel: No results for input(s): VITAMINB12, FOLATE, FERRITIN, TIBC,  IRON, RETICCTPCT in the last 72 hours. Sepsis Labs: No results for input(s): PROCALCITON, LATICACIDVEN in the last 168 hours.  Recent Results (from the past 240 hour(s))  Urine culture     Status: Abnormal   Collection Time: 12/10/16 10:55 AM  Result Value Ref Range Status   Specimen Description URINE, CLEAN CATCH  Final   Special Requests NONE  Final   Culture (A)  Final    <10,000 COLONIES/mL INSIGNIFICANT GROWTH Performed at [facility], [address], [place], [place] [address]    Report Status 12/12/2016 FINAL  Final  Culture, blood (Routine x 2)     Status: None   Collection Time: 12/10/16 12:42 PM  Result Value Ref Range  Status   Specimen Description BLOOD PORT DRAW  Final   Special Requests Blood Culture adequate volume  Final   Culture NO GROWTH 5 DAYS  Final   Report Status 12/15/2016 FINAL  Final  Culture, blood (Routine x 2)     Status: None   Collection Time: 12/10/16 12:51 PM  Result Value Ref Range Status   Specimen Description BLOOD RIGHT ARM  Final   Special Requests   Final    BOTTLES DRAWN AEROBIC AND ANAEROBIC Blood Culture adequate volume   Culture NO GROWTH 5 DAYS  Final   Report Status 12/15/2016 FINAL  Final  MRSA PCR Screening     Status: None   Collection Time: 12/14/16  8:25 AM  Result Value Ref Range Status   MRSA by PCR NEGATIVE NEGATIVE Final    Comment:        The GeneXpert MRSA Assay (FDA approved for NASAL specimens only), is one component of a comprehensive MRSA colonization surveillance program. It is not intended to diagnose MRSA infection nor to guide or monitor treatment for MRSA infections.          Radiology Studies: US Venous Img Upper Uni Right  Result Date: 12/18/2016 CLINICAL DATA:  Right upper extremity edema. History of PICC line placement. History of diabetes. Evaluate for DVT. EXAM: RIGHT UPPER EXTREMITY VENOUS DOPPLER ULTRASOUND TECHNIQUE: Gray-scale sonography with graded compression, as well as color  Doppler and duplex ultrasound were performed to evaluate the upper extremity deep venous system from the level of the subclavian vein and including the jugular, axillary, basilic, radial, ulnar and upper cephalic vein. Spectral Doppler was utilized to evaluate flow at rest and with distal augmentation maneuvers. COMPARISON:  None. FINDINGS: Contralateral Subclavian Vein: Respiratory phasicity is normal and symmetric with the symptomatic side. No evidence of thrombus. Normal compressibility. Internal Jugular Vein: No evidence of thrombus. Normal compressibility, respiratory phasicity and response to augmentation. There is hypoechoic occlusive thrombus within the right subclavian vein (image 13) extending to involve the axillary vein (image 17), 1 of the paired brachial veins (image 21). The additional paired brachial vein appears patent where imaged (representative image 22). There is hypoechoic occlusive thrombus within the right basilic vein which is noted containing portion of the hit line (image 24). The radial, ulnar and cephalic veins appear patent where imaged. Other Findings:  None visualized. IMPRESSION: Examination is positive for presumed PICC line associated occlusive DVT and superficial thrombophlebitis involving the PICC line containing basilic vein, one of the paired brachial veins as well as the right axillary and imaged portions of the right subclavian vein. Electronically Signed   By: Sandi Mariscal M.D.   On: 12/18/2016 15:15        Scheduled Meds: . chlorhexidine  15 mL Mouth Rinse BID  . collagenase   Topical Daily  . feeding supplement (ENSURE ENLIVE)  237 mL Oral TID BM  . fluticasone furoate-vilanterol  1 puff Inhalation Daily  . insulin aspart  0-9 Units Subcutaneous Q4H  . mouth rinse  15 mL Mouth Rinse q12n4p  . metoprolol tartrate  25 mg Oral BID  . multivitamin with minerals  1 tablet Oral Daily  . nicotine  21 mg Transdermal Daily  . saccharomyces boulardii  250 mg Oral BID   . sodium chloride flush  10-40 mL Intracatheter Q12H  . vancomycin  125 mg Oral Q6H   Continuous Infusions: . sodium chloride    . sodium chloride    . sodium chloride    .  sodium chloride    . sodium chloride 10 mL/hr at 12/19/16 1210     LOS: 10 days      Shaquina Gillham Gerome Apley, MD [facility] Pager [phone]  If 7PM-7AM, please contact night-coverage [email or web address] Password [email or web address] 12/20/2016, 10:04 AM

## 2016-12-20 NOTE — Progress Notes (Signed)
Subjective: Interval History: Patient offers no complaints. He denies any difficulty breathing.  Objective: Vital signs in last 24 hours: Temp:  [97.7 F (36.5 C)-98.1 F (36.7 C)] 97.7 F (36.5 C) (07/19 0650) Pulse Rate:  [78-97] 91 (07/19 0650) Resp:  [18-20] 20 (07/19 0650) BP: (136-162)/(56-96) 143/91 (07/19 0650) SpO2:  [96 %-99 %] 97 % (07/19 0650) Weight change:   Intake/Output from previous day: 07/18 0701 - 07/19 0700 In: 1713.8 [P.O.:1140; I.V.:233.8; Blood:340] Out: 1700 [Urine:1700] Intake/Output this shift: No intake/output data recorded.  Patient is alert and in no apparent distress. Chest: Clear to auscultation Heart exam regular rate and rhythm Extremities no edema  Lab Results:  Recent Labs  12/19/16 2237 12/20/16 0131  WBC 15.2* 14.2*  HGB 7.1* 6.6*  HCT 21.0* 19.6*  PLT 251 228   BMET:   Recent Labs  12/18/16 0740 12/19/16 0638  NA 140 140  K 3.2* 3.9  CL 104 105  CO2 25 26  GLUCOSE 139* 179*  BUN 50* 66*  CREATININE 4.71* 4.71*  CALCIUM 7.6* 7.7*   No results for input(s): PTH in the last 72 hours. Iron Studies: No results for input(s): IRON, TIBC, TRANSFERRIN, FERRITIN in the last 72 hours.  Studies/Results: US Venous Img Upper Uni Right  Result Date: 12/18/2016 CLINICAL DATA:  Right upper extremity edema. History of PICC line placement. History of diabetes. Evaluate for DVT. EXAM: RIGHT UPPER EXTREMITY VENOUS DOPPLER ULTRASOUND TECHNIQUE: Gray-scale sonography with graded compression, as well as color Doppler and duplex ultrasound were performed to evaluate the upper extremity deep venous system from the level of the subclavian vein and including the jugular, axillary, basilic, radial, ulnar and upper cephalic vein. Spectral Doppler was utilized to evaluate flow at rest and with distal augmentation maneuvers. COMPARISON:  None. FINDINGS: Contralateral Subclavian Vein: Respiratory phasicity is normal and symmetric with the symptomatic  side. No evidence of thrombus. Normal compressibility. Internal Jugular Vein: No evidence of thrombus. Normal compressibility, respiratory phasicity and response to augmentation. There is hypoechoic occlusive thrombus within the right subclavian vein (image 13) extending to involve the axillary vein (image 17), 1 of the paired brachial veins (image 21). The additional paired brachial vein appears patent where imaged (representative image 22). There is hypoechoic occlusive thrombus within the right basilic vein which is noted containing portion of the hit line (image 24). The radial, ulnar and cephalic veins appear patent where imaged. Other Findings:  None visualized. IMPRESSION: Examination is positive for presumed PICC line associated occlusive DVT and superficial thrombophlebitis involving the PICC line containing basilic vein, one of the paired brachial veins as well as the right axillary and imaged portions of the right subclavian vein. Electronically Signed   By: Sandi Mariscal M.D.   On: 12/18/2016 15:15    I have reviewed the patient's current medications.  Assessment/Plan: Problem #1 altered mental status: Patient is alert and in no apparent distress. Problem #2 acute kidney injury: Patient is nonoliguric. Patient is nonoliguric with 1700 mL of urine output. His renal function seems to be stable. Problem #3 low CO2 possibly metabolic: His CO2 is normal Problem #4 history of C. difficile colitis. Patient presently denies any more diarrhea. He denies also any abdominal pain. Patient is afebrile but his white blood cell counts remain high. Problem #5 hypertension: His blood pressure is reasonably controlled Problem #6 Bone and mineral disorder: His calcium and phosphorus is in range. Problem #7 history of diabetes: Reasonably controlled Problem #8 history of CVA Problem #9 hypokalemia:  His potassium is normal. Problem #10 anemia: His hemoglobin has declined further. Plan: 1] We'll continue his  hydration 2] patient may require blood transfusion. 3] we'll check CBC and renal panel in the morning   LOS: 10 days   Zhana Jeangilles S 12/20/2016,8:01 AM

## 2016-12-20 NOTE — Care Management Note (Signed)
Case Management Note  Patient Details  Name: Eric Scott MRN: [id number] Date of Birth: Oct 22, 1950   Status of Service:  In process, will continue to follow  If discussed at [facility] Length of Stay Meetings, dates discussed:  12/20/2016 Additional Comments:  Karliah Kowalchuk, Chauncey Reading, RN 12/20/2016, 10:31 AM

## 2016-12-20 NOTE — Plan of Care (Signed)
Problem: Skin Integrity: Goal: Risk for impaired skin integrity will decrease Outcome: Progressing Pt has multiple sores/pressure sores on bottom and bilateral feet. Bilateral dressings on feet changed BID, and sacrum PRN. Pt turned q2h to prevent further skin breakdown. Pt needs maximum assist when turning. Pt educated on importance of turning q2h. Pt verbalized understanding. Will continue to monitor pt

## 2016-12-20 NOTE — Consult Note (Signed)
Referring Provider: Tawni Millers,* Primary Care Physician:  [name], MD Primary Gastroenterologist:  Barney Drain, MD  Reason for Consultation:  Fabienne Bruns bleeding  HPI: Eric Scott is a 66 y.o. male admitted back on 12/10/16 with altered mental status, acute kidney disease, hypothermia. He has h/o chronic pain, osteomyelitis on Dapsone and Cefepime thru PICC line, DM, PVD, prior CVA, h/o recent cdiff diagnosed on 11/26/16. Brought to ER basically unresponsive. CT head negative for acute process, WBC 18,000. Creatinine 8.4, from 1.5 at time of recent discharge. LFTs were elevated, abd u/s without acute cholecystitis or biliary obstruction but did have some steatosis and gallstones. Patient required emergent hemodialysis day of admission with subsequent sessions. After a couple of days his mentation improved. Dapsone and Cefepime have been discontinued. Oral vancomycin continued for cdiff and persistent diarrhea.   Diagnosed with right upper extremity DVT 12/18/16. Related to PICC line. Started on IV heparin. 12/19/17 patient has a "dark stool, almost black". Heme + stool. Heparin drip stopped at 11:45pm on 12/19/16. Hgb 12.5 on admission but with rehydration had stabilized at 11.2. Five days later Hgb checked after dark heme + stool and Hgb went from 8.5 down to 6.6 in 24 hours. Received 2 units of prbcs with rise in Hgb to 7.5 after the first unit. IV protonix BID started this evening.   Patient denies abd pain, heartburn, dysphagia, vomiting. Complains of anorexia and chronic back pain. States his stools have been black and loose. 5 BMs yesterday, 2 today. VSS.  Patient reports no prior EGD. No NSAIDS but takes ASA daily at home. Last TCS couple of years ago per patient, at [facility].    Prior to Admission medications   Medication Sig Start Date End Date Taking? Authorizing Provider  albuterol (PROVENTIL) (2.5 MG/3ML) 0.083% nebulizer solution Take 2.5 mg by nebulization every 3  (three) hours as needed for wheezing or shortness of breath.   Yes [provider]  ALPRAZolam Duanne Moron) 1 MG tablet Take 1 tablet (1 mg total) by mouth 3 (three) times daily. 03/07/15  Yes Samuella Cota, MD  BREO ELLIPTA 100-25 MCG/INH AEPB  09/27/16  Yes [provider]  ceFEPIme 2 g in dextrose 5 % 50 mL Inject 2 g into the vein every 12 (twelve) hours. Monitor labs and make dose adjustments per pharmacy protocol and input. 11/11/16  Yes Samuella Cota, MD  citalopram (CELEXA) 20 MG tablet Take 20 mg by mouth daily.   Yes [provider]  DAPTOmycin 800 mg in sodium chloride 0.9 % 100 mL Inject 800 mg into the vein daily. 11/29/16  Yes Isaac Bliss, Rayford Halsted, MD  fish oil-omega-3 fatty acids 1000 MG capsule Take 1 g by mouth 2 (two) times daily.   Yes [provider]  gabapentin (NEURONTIN) 800 MG tablet Take 800 mg by mouth 3 (three) times daily.   Yes [provider]  HUMALOG KWIKPEN 100 UNIT/ML KiwkPen Inject 2-14 Units into the skin 4 (four) times daily -  before meals and at bedtime. Per sliding scale 150-200= 2 units 201-250= 4 units 251-300= 6 units 301-349= 8 units 350-400=10units 401-450=12units 451-500=14units ABOVE 501- CALL MD 10/07/16  Yes [provider]  ipratropium-albuterol (DUONEB) 0.5-2.5 (3) MG/3ML SOLN Inhale 3 mLs into the lungs 3 (three) times daily.  07/09/16  Yes [provider]  L-Methylfolate-Algae-B12-B6 Glade Stanford) 3-90.314-2-35 MG CAPS Take 1 capsule by mouth 3 (three) times daily.   Yes [provider]  l-methylfolate-B6-B12 Glade Stanford) 3-35-2  MG TABS tablet Take 1 tablet by mouth 2 (two) times daily.   Yes [provider]  LANTUS SOLOSTAR 100 UNIT/ML Solostar Pen 24 Units daily at 10 pm.  10/29/16  Yes [provider]  NON FORMULARY Apply 1 application topically 2 (two) times daily. 14 day course starting on 12/07/2016  GREER GOO   Yes [provider]  oxyCODONE (OXY  IR/ROXICODONE) 5 MG immediate release tablet Take 1 tablet (5 mg total) by mouth every 8 (eight) hours as needed for severe pain. 11/11/16  Yes Samuella Cota, MD  saccharomyces boulardii (FLORASTOR) 250 MG capsule Take 1 capsule (250 mg total) by mouth 3 (three) times daily. 11/29/16  Yes Isaac Bliss, Rayford Halsted, MD  vancomycin (VANCOCIN) 50 mg/mL oral solution Take 2.5 mLs (125 mg total) by mouth 4 (four) times daily. 11/29/16  Yes Isaac Bliss, Rayford Halsted, MD  Vitamin D, Ergocalciferol, (DRISDOL) 50000 units CAPS capsule Take 50,000 Units by mouth every 30 (thirty) days.   Yes [provider]  collagenase (SANTYL) ointment Apply topically daily. Apply Santyl to left heel and right outer foot Q day, then cover with moist gauze, then kerlex and tape. Patient not taking: Reported on 12/10/2016 11/11/16   Samuella Cota, MD    Current Facility-Administered Medications  Medication Dose Route Frequency Provider Last Rate Last Dose  . 0.9 %  sodium chloride infusion   Intravenous Continuous Arrien, Jimmy Picket, MD 10 mL/hr at 12/19/16 1210    . acetaminophen (TYLENOL) tablet 650 mg  650 mg Oral Q6H PRN Kathie Dike, MD   650 mg at 12/20/16 1732  . ALPRAZolam Duanne Moron) tablet 0.5 mg  0.5 mg Oral BID PRN Kathie Dike, MD   0.5 mg at 12/19/16 2038  . chlorhexidine (PERIDEX) 0.12 % solution 15 mL  15 mL Mouth Rinse BID Kathie Dike, MD   15 mL at 12/20/16 0930  . collagenase (SANTYL) ointment   Topical Daily Memon, Jolaine Artist, MD      . feeding supplement (ENSURE ENLIVE) (ENSURE ENLIVE) liquid 237 mL  237 mL Oral TID BM Kathie Dike, MD   237 mL at 12/20/16 1814  . fluticasone furoate-vilanterol (BREO ELLIPTA) 100-25 MCG/INH 1 puff  1 puff Inhalation Daily Kathie Dike, MD   1 puff at 12/20/16 0845  . insulin aspart (novoLOG) injection 0-9 Units  0-9 Units Subcutaneous Q4H Orvan Falconer, MD   2 Units at 12/20/16 1731  . lidocaine (PF) (XYLOCAINE) 1 % injection 5 mL  5 mL  Intradermal PRN Fran Lowes, MD      . lidocaine-prilocaine (EMLA) cream 1 application  1 application Topical PRN Fran Lowes, MD      . MEDLINE mouth rinse  15 mL Mouth Rinse q12n4p Kathie Dike, MD   15 mL at 12/20/16 1600  . metoprolol tartrate (LOPRESSOR) tablet 25 mg  25 mg Oral BID Kathie Dike, MD   25 mg at 12/20/16 0929  . multivitamin with minerals tablet 1 tablet  1 tablet Oral Daily Kathie Dike, MD   1 tablet at 12/20/16 0930  . nicotine (NICODERM CQ - dosed in mg/24 hours) patch 21 mg  21 mg Transdermal Daily Kathie Dike, MD   21 mg at 12/20/16 0937  . oxyCODONE (Oxy IR/ROXICODONE) immediate release tablet 5 mg  5 mg Oral Q6H PRN Kathie Dike, MD   5 mg at 12/20/16 1530  . pantoprazole (PROTONIX) injection 40 mg  40 mg Intravenous Q12H Arrien, Jimmy Picket, MD   40 mg  at 12/20/16 1732  . pentafluoroprop-tetrafluoroeth (GEBAUERS) aerosol 1 application  1 application Topical PRN Fran Lowes, MD      . saccharomyces boulardii (FLORASTOR) capsule 250 mg  250 mg Oral BID Kathie Dike, MD   250 mg at 12/20/16 0929  . sodium chloride flush (NS) 0.9 % injection 10-40 mL  10-40 mL Intracatheter Q12H Kathie Dike, MD   10 mL at 12/20/16 0955  . sodium chloride flush (NS) 0.9 % injection 10-40 mL  10-40 mL Intracatheter PRN Kathie Dike, MD      . vancomycin (VANCOCIN) 50 mg/mL oral solution 125 mg  125 mg Oral Q6H Kathie Dike, MD   125 mg at 12/20/16 1814    Allergies as of 12/10/2016 - Review Complete 12/10/2016  Allergen Reaction Noted  . Blueberry flavor  05/29/2016  . Cucumber extract Other (See Comments) 08/01/2012  . Flexeril [cyclobenzaprine hcl] Other (See Comments) 07/20/2010  . Kiwi extract Other (See Comments) 08/01/2012    Past Medical History:  Diagnosis Date  . Anxiety   . Chronic pain    legs, back; MRI 05/2012 with mild thoracic degenerative changes no spinal stenosis   . COPD (chronic obstructive pulmonary disease)  ([place])   . Depression   . Diabetic peripheral neuropathy ([place])    "chronic" (05/27/2012)  . Diastolic CHF ([place])   . Hypercholesteremia   . Hypertension   . Peripheral edema   . PVD (peripheral vascular disease) ([place])   . Spinal stenosis    mild lumbar (MRI 05/2012)-L2-L3 to L4-L5 , mild lumbar foraminal stenosis   . Stroke ([place]) 05/2012   Subacute, lacunar infarcts within the left basal ganglia and posterior limp of the left internal capsule/thalamus; "RUE; both feet weak" (05/27/2012)  . Tremor   . Type II diabetes mellitus ([place])     Past Surgical History:  Procedure Laterality Date  . ANKLE SURGERY    . HERNIA REPAIR  01/05/2004   "belly button" (05/27/2012)    Family History  Problem Relation Age of Onset  . Colon cancer Neg Hx     Social History   Social History  . Marital status: Legally Separated    Spouse name: N/A  . Number of children: N/A  . Years of education: N/A   Occupational History  . Not on file.   Social History Main Topics  . Smoking status: Former Smoker    Packs/day: 0.50    Years: 45.00    Types: Cigarettes    Quit date: 12/28/2014  . Smokeless tobacco: Never Used  . Alcohol use No     Comment: 05/27/2012 "drank gallons and gallons 20 yr ago or so; last drink  at least 10 yr ago"  . Drug use: No  . Sexual activity: No   Other Topics Concern  . Not on file   Social History Narrative   Patient lives in [place] with a friend.   He is divorced. Has one kid.   Not working for about 7 years now. Used to do plumbing work.     ROS:  General: Negative for anorexia, weight loss, fever, chills, fatigue, +weakness. Eyes: Negative for vision changes.  ENT: Negative for hoarseness, difficulty swallowing , nasal congestion. CV: Negative for chest pain, angina, palpitations, dyspnea on exertion, peripheral edema.  Respiratory: Negative for dyspnea at rest, dyspnea on exertion, cough, sputum, wheezing.  GI: See history of present illness. GU:   Negative for dysuria, hematuria, urinary incontinence, urinary frequency, nocturnal urination.  MS: Negative for joint  pain. +  low back pain.  Derm: Negative for rash or itching.  Neuro: Negative for weakness, abnormal sensation, seizure, frequent headaches, memory loss, confusion.  Psych: Negative for anxiety, depression, suicidal ideation, hallucinations.  Endo: Negative for unusual weight change.  Heme: Negative for bruising or bleeding. Allergy: Negative for rash or hives.       Physical Examination: Vital signs in last 24 hours: Temp:  [97.7 F (36.5 C)-98.1 F (36.7 C)] 97.9 F (36.6 C) (07/19 1645) Pulse Rate:  [77-97] 77 (07/19 1645) Resp:  [18-20] 19 (07/19 1447) BP: (143-162)/(56-96) 152/86 (07/19 1645) SpO2:  [93 %-99 %] 93 % (07/19 1958) Last BM Date: 12/19/16  General: chronically ill-appearing male in NAD.answers questions appropriately. No acute distress.  Head: Normocephalic, atraumatic.   Eyes: Conjunctiva pale, no icterus. Mouth: Oropharyngeal mucosa moist and pink , no lesions erythema or exudate. Neck: Supple without thyromegaly, masses, or lymphadenopathy.  Lungs: Clear to auscultation bilaterally.  Heart: Regular rate and rhythm, no murmurs rubs or gallops.  Abdomen: Bowel sounds are normal, nontender, nondistended, no hepatosplenomegaly or masses, no abdominal bruits or    hernia , no rebound or guarding.   Rectal: not performed Extremities: No lower extremity edema, clubbing, deformity. Dressing in place.  Neuro: Alert and oriented x 4 , grossly normal neurologically.  Skin: Warm and dry, no rash or jaundice.   Psych: Alert and cooperative, normal mood and affect.        Intake/Output from previous day: 07/18 0701 - 07/19 0700 In: 1713.8 [P.O.:1140; I.V.:233.8; Blood:340] Out: 1700 [Urine:1700] Intake/Output this shift: No intake/output data recorded.  Lab Results: CBC  Recent Labs  12/19/16 2237 12/20/16 0131 12/20/16 1040  WBC 15.2* 14.2*  13.7*  HGB 7.1* 6.6* 7.5*  HCT 21.0* 19.6* 22.2*  MCV 97.2 97.5 96.1  PLT 251 228 243   BMET  Recent Labs  12/18/16 0740 12/19/16 0638 12/20/16 1040  NA 140 140 146*  K 3.2* 3.9 4.3  CL 104 105 108  CO2 25 26 26   GLUCOSE 139* 179* 250*  BUN 50* 66* 98*  CREATININE 4.71* 4.71* 4.96*  CALCIUM 7.6* 7.7* 8.1*   LFT  Recent Labs  12/18/16 0740 12/19/16 9622  ALBUMIN 1.9* 2.0*    Lipase No results for input(s): LIPASE in the last 72 hours.  PT/INR No results for input(s): LABPROT, INR in the last 72 hours.    Imaging Studies: Ct Abdomen Pelvis Wo Contrast  Result Date: 11/23/2016 CLINICAL DATA:  66 year old male with nausea and abdominal discomfort. Diaphoresis. Abdominal CT dated 10/25/2016 EXAM: CT ABDOMEN AND PELVIS WITHOUT CONTRAST TECHNIQUE: Multidetector CT imaging of the abdomen and pelvis was performed following the standard protocol without IV contrast. COMPARISON:  None. FINDINGS: Evaluation of this exam is limited in the absence of intravenous contrast. Lower chest: Minimal bibasilar atelectatic changes. There is a 4 mm left lung base nodule as seen on the prior CT. No intra-abdominal free air or free fluid. Hepatobiliary: The liver is unremarkable. No intrahepatic biliary ductal dilatation. There is a 3 cm stone within the gallbladder. The gallbladder is distended. No pericholecystic fluid. Ultrasound may provide better evaluation of the gallbladder clinically indicated. Pancreas: Unremarkable. No pancreatic ductal dilatation or surrounding inflammatory changes. Spleen: Normal in size without focal abnormality. 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 The left adrenal gland is unremarkable. Bilateral renal hypodense lesions are not well characterized on this unenhanced CT but appears similar to prior study. A 2  cm hypodense lesion in the interpolar aspect of the left kidney demonstrates fluid attenuation and most likely  represents a cyst. Subcentimeter exophytic right renal inferior pole lesion is not characterized. There is no hydronephrosis or nephrolithiasis on either side. The visualized ureters appear unremarkable. The previously described thickened appearance of the left bladder wall is less appreciated on this exam. Follow-up with cystoscopy as previously recommended. Stomach/Bowel: There is sigmoid diverticulosis without active inflammatory changes. No evidence of bowel obstruction or active inflammation. Normal appendix. Vascular/Lymphatic: There is moderate aortoiliac atherosclerotic disease. Evaluation of the vasculature is limited in the absence of intravenous contrast. No portal venous gas identified. Chest mildly enlarged external iliac lymph nodes as previously described. Follow-up evaluation as per recommendations of the prior CT. Reproductive: The prostate and seminal vesicles are grossly unremarkable. Other: Small fat containing bilateral inguinal and umbilical hernia. Musculoskeletal: Degenerative changes of the spine. Lower lumbar disc desiccation with vacuum phenomena. No acute fracture. IMPRESSION: 1. No hydronephrosis or nephrolithiasis. 2. Cholelithiasis. No pericholecystic fluid. Ultrasound may provide better evaluation of the gallbladder if clinically indicated. 3. Sigmoid diverticulosis. No bowel obstruction or active inflammation. Normal appendix. 4. Aortic Atherosclerosis (ICD10-I70.0). Electronically Signed   By: Anner Crete M.D.   On: 11/23/2016 02:41   Ct Head Wo Contrast  Result Date: 12/10/2016 CLINICAL DATA:  Fall yesterday.  Lethargic EXAM: CT HEAD WITHOUT CONTRAST TECHNIQUE: Contiguous axial images were obtained from the base of the skull through the vertex without intravenous contrast. COMPARISON:  CT 12/09/2016 FINDINGS: Brain: Mild atrophy unchanged. Negative for hydrocephalus. Chronic ischemic changes in the white matter including the left internal and external capsule. Negative for  acute infarct, hemorrhage, or mass lesion. No fluid collection Vascular: Negative for hyperdense vessel Skull: Negative Sinuses/Orbits: Mild mucosal edema right maxillary sinus and left ethmoid sinus. Negative orbit. Other: None IMPRESSION: No acute intracranial abnormality.  Stable since the recent study. Electronically Signed   By: Franchot Gallo M.D.   On: 12/10/2016 13:31   Ct Head Wo Contrast  Result Date: 12/09/2016 CLINICAL DATA:  Pain following fall EXAM: CT HEAD WITHOUT CONTRAST CT CERVICAL SPINE WITHOUT CONTRAST TECHNIQUE: Multidetector CT imaging of the head and cervical spine was performed following the standard protocol without intravenous contrast. Multiplanar CT image reconstructions of the cervical spine were also generated. COMPARISON:  Head CT November 08, 2016 FINDINGS: CT HEAD FINDINGS Brain: Mild diffuse atrophy is stable. There is no intracranial mass, hemorrhage, extra-axial fluid collection, or midline shift there is patchy small vessel disease in the centra semiovale bilaterally. There is evidence of a prior infarct involving much of the left external capsule, primarily anteriorly. There is a prior infarct in the lateral left thalamus. There is no new gray-white compartment lesion. No acute infarct is evident. Vascular: No hyperdense vessel. There is calcification in each carotid siphon region. Skull: Bony calvarium appears intact. Sinuses/Orbits: There is mucosal thickening with retention cyst in the posterior right maxillary antrum. There is opacification at several sites in left ethmoid region with mucosal thickening in multiple ethmoid air cells bilaterally. Other visualized paranasal sinuses are clear. Orbits appear symmetric bilaterally. Other: Mastoid air cells are clear. CT CERVICAL SPINE FINDINGS Alignment: There is no spondylolisthesis. Skull base and vertebrae: The skull base and craniocervical junction regions appear normal. There is no evident fracture. There is erosive change in  the odontoid. There are no blastic or lytic bone lesions. Soft tissues and spinal canal: Prevertebral soft tissues and predental space regions are normal. There are no paraspinous  lesions. No cord or canal hematoma evident. Disc levels: There is moderate disc space narrowing at C6-7. There is facet hypertrophy at several levels. No nerve root edema or effacement. No disc extrusion or stenosis. Upper chest: Visualized upper lung regions appear normal. Other: There is calcification in each proximal subclavian artery as well as in each carotid artery. IMPRESSION: CT head: Atrophy with periventricular small vessel disease. 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 No acute infarct. No intracranial mass, hemorrhage, or extra-axial fluid collection. Foci of arteriovascular calcification noted. Air areas of paranasal sinus disease noted. CT cervical spine: No fracture or spondylolisthesis. Erosive change in the odontoid without impending fracture evident. Areas of osteoarthritic change. No disc extrusion or stenosis. Multiple foci of vascular calcification including foci of calcification in each carotid artery. Electronically Signed   By: Lowella Grip III M.D.   On: 12/09/2016 18:13   Ct Cervical Spine Wo Contrast  Result Date: 12/09/2016 CLINICAL DATA:  Pain following fall EXAM: CT HEAD WITHOUT CONTRAST CT CERVICAL SPINE WITHOUT CONTRAST TECHNIQUE: Multidetector CT imaging of the head and cervical spine was performed following the standard protocol without intravenous contrast. Multiplanar CT image reconstructions of the cervical spine were also generated. COMPARISON:  Head CT November 08, 2016 FINDINGS: CT HEAD FINDINGS Brain: Mild diffuse atrophy is stable. There is no intracranial mass, hemorrhage, extra-axial fluid collection, or midline shift there is patchy small vessel disease in the centra semiovale bilaterally. There is evidence of a prior infarct involving much of the left  external capsule, primarily anteriorly. There is a prior infarct in the lateral left thalamus. There is no new gray-white compartment lesion. No acute infarct is evident. Vascular: No hyperdense vessel. There is calcification in each carotid siphon region. Skull: Bony calvarium appears intact. Sinuses/Orbits: There is mucosal thickening with retention cyst in the posterior right maxillary antrum. There is opacification at several sites in left ethmoid region with mucosal thickening in multiple ethmoid air cells bilaterally. Other visualized paranasal sinuses are clear. Orbits appear symmetric bilaterally. Other: Mastoid air cells are clear. CT CERVICAL SPINE FINDINGS Alignment: There is no spondylolisthesis. Skull base and vertebrae: The skull base and craniocervical junction regions appear normal. There is no evident fracture. There is erosive change in the odontoid. There are no blastic or lytic bone lesions. Soft tissues and spinal canal: Prevertebral soft tissues and predental space regions are normal. There are no paraspinous lesions. No cord or canal hematoma evident. Disc levels: There is moderate disc space narrowing at C6-7. There is facet hypertrophy at several levels. No nerve root edema or effacement. No disc extrusion or stenosis. Upper chest: Visualized upper lung regions appear normal. Other: There is calcification in each proximal subclavian artery as well as in each carotid artery. IMPRESSION: CT head: Atrophy with periventricular small vessel disease. 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 No acute infarct. No intracranial mass, hemorrhage, or extra-axial fluid collection. Foci of arteriovascular calcification noted. Air areas of paranasal sinus disease noted. CT cervical spine: No fracture or spondylolisthesis. Erosive change in the odontoid without impending fracture evident. Areas of osteoarthritic change. No disc extrusion or stenosis. Multiple foci of  vascular calcification including foci of calcification in each carotid artery. Electronically Signed   By: Lowella Grip III M.D.   On: 12/09/2016 18:13   US Abdomen Complete  Result Date: 12/10/2016 CLINICAL DATA:  66 y/o M; increased liver function tests, gallstones, altered mental status. EXAM: ABDOMEN ULTRASOUND COMPLETE  COMPARISON:  11/24/2016 abdominal ultrasound. 11/23/2016 CT of the abdomen and pelvis. FINDINGS: Gallbladder: No gallbladder wall thickening or pericholecystic fluid and negative sonographic Murphy's sign. Gallstones measuring up to 2.7 cm. Some additional stones and sludge adherent to the wall of the gallbladder. Common bile duct: Diameter: 6 mm Liver: No focal lesion identified. Mildly increased echogenicity of the liver parenchyma. Normal direction of flow in main portal vein. IVC: No abnormality visualized. Pancreas: Obscured by bowel gas. Spleen: Size and appearance within normal limits. Right Kidney: Length: 11.2 cm. Increased renal echogenicity. No hydronephrosis or mass identified. Left Kidney: Length: 13.6 cm. Increase renal echogenicity. Simple appearing cyst measuring up to 2.3 cm within the upper pole. Abdominal aorta: Largely obscured by bowel gas. Other findings: None. IMPRESSION: 1. Gallstones and sludge, some adherent to the wall of gallbladder. No secondary signs of acute cholecystitis. 2. Mildly increased liver echogenicity, possible steatosis. 3. Increased renal echogenicity compatible with medical renal disease. Electronically Signed   By: Kristine Garbe M.D.   On: 12/10/2016 14:01   US Venous Img Upper Uni Right  Result Date: 12/18/2016 CLINICAL DATA:  Right upper extremity edema. History of PICC line placement. History of diabetes. Evaluate for DVT. EXAM: RIGHT UPPER EXTREMITY VENOUS DOPPLER ULTRASOUND TECHNIQUE: Gray-scale sonography with graded compression, as well as color Doppler and duplex ultrasound were performed to evaluate the upper extremity  deep venous system from the level of the subclavian vein and including the jugular, axillary, basilic, radial, ulnar and upper cephalic vein. Spectral Doppler was utilized to evaluate flow at rest and with distal augmentation maneuvers. COMPARISON:  None. FINDINGS: Contralateral Subclavian Vein: Respiratory phasicity is normal and symmetric with the symptomatic side. No evidence of thrombus. Normal compressibility. Internal Jugular Vein: No evidence of thrombus. Normal compressibility, respiratory phasicity and response to augmentation. There is hypoechoic occlusive thrombus within the right subclavian vein (image 13) extending to involve the axillary vein (image 17), 1 of the paired brachial veins (image 21). The additional paired brachial vein appears patent where imaged (representative image 22). There is hypoechoic occlusive thrombus within the right basilic vein which is noted containing portion of the hit line (image 24). The radial, ulnar and cephalic veins appear patent where imaged. Other Findings:  None visualized. IMPRESSION: Examination is positive for presumed PICC line associated occlusive DVT and superficial thrombophlebitis involving the PICC line containing basilic vein, one of the paired brachial veins as well as the right axillary and imaged portions of the right subclavian vein. Electronically Signed   By: Sandi Mariscal M.D.   On: 12/18/2016 15:15   Dg Chest Port 1 View  Result Date: 11/23/2016 CLINICAL DATA:  66 year old male with diaphoresis. EXAM: PORTABLE CHEST 1 VIEW COMPARISON:  Chest radiograph dated 11/08/2016 FINDINGS: There is a right sided PICC with tip at the cavoatrial junction. The lungs are clear. There is overall interval improvement of the previously seen interstitial edema. There is no pleural effusion or pneumothorax. There is enlargement of the cardiopericardial silhouette. Atherosclerotic calcification of the aortic arch. No acute osseous pathology. IMPRESSION: 1.  Cardiomegaly.  No focal consolidation. 2. Interval improvement of the previously seen pulmonary edema. 3. Right-sided PICC with tip at the cavoatrial junction in stable positioning. Electronically Signed   By: Anner Crete M.D.   On: 11/23/2016 00:24   Dg Knee Complete 4 Views Left  Result Date: 12/09/2016 CLINICAL DATA:  Fall from bed.  Bilateral knee pain and erythema. EXAM: LEFT KNEE - COMPLETE 4+ VIEW COMPARISON:  01/07/2006 FINDINGS:  Articular space narrowing, particularly in the medial compartment. Trace knee effusion.  Minimal marginal spurring along the patella. IMPRESSION: 1. Trace knee effusion. 2. Mild osteoarthritis. Electronically Signed   By: Van Clines M.D.   On: 12/09/2016 17:47   Dg Knee Complete 4 Views Right  Result Date: 12/09/2016 CLINICAL DATA:  Fall from bed, knee pain and erythema. EXAM: RIGHT KNEE - COMPLETE 4+ VIEW COMPARISON:  03/04/2015 FINDINGS: Motion artifact on several images. No fracture or overt knee effusion. Suspected mild prepatellar subcutaneous edema. Medial compartmental articular space narrowing. IMPRESSION: 1. Degenerative chondral thinning in the medial compartment. 2. Prepatellar subcutaneous edema. 3. Mildly reduced sensitivity due to motion artifact. Electronically Signed   By: Van Clines M.D.   On: 12/09/2016 17:50   Dg Abd Acute W/chest  Result Date: 12/10/2016 CLINICAL DATA:  66 year old male with Celsius difficile. Subsequent encounter. EXAM: DG ABDOMEN ACUTE W/ 1V CHEST COMPARISON:  11/23/2016 CT abdomen pelvis and chest x-ray. FINDINGS: Cardiomegaly. Pulmonary vascular prominence. PICC line tip caval distal superior vena cava/ atrial junction level. Calcified aorta with slight tortuosity. Gas-filled stomach. No plain film evidence of bowel obstruction or free intraperitoneal air. IMPRESSION: No plain film evidence of bowel obstruction or free intraperitoneal air. Cardiomegaly. Pulmonary vascular prominence. Calcified slightly tortuous  aorta. Electronically Signed   By: Genia Del M.D.   On: 12/10/2016 11:32   US Abdomen Limited Ruq  Result Date: 11/24/2016 CLINICAL DATA:  66 year old male with cholelithiasis EXAM: ULTRASOUND ABDOMEN LIMITED RIGHT UPPER QUADRANT COMPARISON:  None. FINDINGS: Gallbladder: Echogenic gallstone in the dependent aspects of the gallbladder, located at the neck of the gallbladder measuring at least 3 cm. Posterior shadowing limits evaluation in the far field from the stone. No gallbladder wall thickening. Sonographic Murphy's sign was not recorded. No pericholecystic fluid. Common bile duct: Diameter: 8 mm Liver: Relatively unremarkable appearance of liver parenchyma. IMPRESSION: Cholelithiasis without sonographic evidence of acute cholecystitis Electronically Signed   By: Corrie Mckusick D.O.   On: 11/24/2016 13:48  [4 week]   Impression: 66 y/o male admitted with altered mental status changes in setting of acute kidney disease requiring emergent hemodialysis. Also with recent Cdiff, on chronic antibiotics for osteomyelitis (Dapsone and Cefepime) and being managed on oral vancomycin. Hospital course complicated by right upper extremity DVT related to PICC. Heparin drip started 12/18/16 and yesterday started passing black heme + stool dropping hemoglobin from 11 to 6.6. Heparin has been discontinued. He denied NSAIDS but takes ASA daily at home. No prior EGD. No evidence of chronic liver disease on imaging. Suspect UGI bleed due to PUD, AVM, less like malignancy.   Abnormal LFTs in setting of acute illness. Recheck.   Cdiff: on oral vancomycin. May require taper especially in chronic antibiotics needed for osteomyelitis.   Plan: 1. Agree with IV PPI BID. 2. NPO after midnight.  3. Likely EGD tomorrow with assistance from anesthesiology given prior etoh use and polypharmacy.  4. Transfused as needed.   We would like to thank you for the opportunity to participate in the care of Eric Scott. Laureen Ochs. Bernarda Caffey [facility] [phone] 7/19/20188:55 PM     LOS: 10 days

## 2016-12-20 NOTE — Progress Notes (Signed)
CRITICAL VALUE ALERT  Critical Value:  Hemoglobin 6.6  Date & Time Notied:  12/20/16 @ 0212am Provider Notified: Dr. Marin Comment  Orders Received/Actions taken: Waiting for call back

## 2016-12-20 NOTE — Progress Notes (Signed)
Pts bilateral heel wound dressings changed per Wound order. Wet to dry dressing, covered with ABD pad wrapped in kerlix. Pt tolerated well, no c/o pain

## 2016-12-21 ENCOUNTER — Encounter (HOSPITAL_COMMUNITY)
Admission: EM | Disposition: A | Discharge status: Skilled Nursing Facility | Payer: Self-pay | Source: Home / Self Care | Attending: Internal Medicine

## 2016-12-21 ENCOUNTER — Inpatient Hospital Stay (HOSPITAL_COMMUNITY): Payer: Medicare Other | Admitting: Anesthesiology

## 2016-12-21 ENCOUNTER — Encounter (HOSPITAL_COMMUNITY): Payer: Self-pay

## 2016-12-21 DIAGNOSIS — K3189 Other diseases of stomach and duodenum: Secondary | ICD-10-CM

## 2016-12-21 DIAGNOSIS — K209 Esophagitis, unspecified: Secondary | ICD-10-CM

## 2016-12-21 DIAGNOSIS — K269 Duodenal ulcer, unspecified as acute or chronic, without hemorrhage or perforation: Secondary | ICD-10-CM

## 2016-12-21 HISTORY — PX: ESOPHAGOGASTRODUODENOSCOPY (EGD) WITH PROPOFOL: SHX5813

## 2016-12-21 LAB — GLUCOSE, CAPILLARY
GLUCOSE-CAPILLARY: 145 mg/dL — AB (ref 65–99)
GLUCOSE-CAPILLARY: 135 mg/dL — AB (ref 65–99)
Glucose-Capillary: 170 mg/dL — ABNORMAL HIGH (ref 65–99)
Glucose-Capillary: 166 mg/dL — ABNORMAL HIGH (ref 65–99)
Glucose-Capillary: 154 mg/dL — ABNORMAL HIGH (ref 65–99)
Glucose-Capillary: 183 mg/dL — ABNORMAL HIGH (ref 65–99)

## 2016-12-21 LAB — HEMOGLOBIN AND HEMATOCRIT, BLOOD
HCT: 25.5 % — ABNORMAL LOW (ref 39.0–52.0)
HEMOGLOBIN: 8.5 g/dL — AB (ref 13.0–17.0)

## 2016-12-21 LAB — CBC
HCT: 22.9 % — ABNORMAL LOW (ref 39.0–52.0)
HEMOGLOBIN: 7.6 g/dL — AB (ref 13.0–17.0)
MCH: 31.7 pg (ref 26.0–34.0)
MCHC: 33.2 g/dL (ref 30.0–36.0)
MCV: 95.4 fL (ref 78.0–100.0)
Platelets: 217 10*3/uL (ref 150–400)
RBC: 2.4 MIL/uL — AB (ref 4.22–5.81)
RDW: 17.9 % — ABNORMAL HIGH (ref 11.5–15.5)
WBC: 11.8 10*3/uL — AB (ref 4.0–10.5)

## 2016-12-21 LAB — RENAL FUNCTION PANEL
ALBUMIN: 2.1 g/dL — AB (ref 3.5–5.0)
ANION GAP: 10 (ref 5–15)
BUN: 99 mg/dL — ABNORMAL HIGH (ref 6–20)
CALCIUM: 8.2 mg/dL — AB (ref 8.9–10.3)
CO2: 26 mmol/L (ref 22–32)
Chloride: 106 mmol/L (ref 101–111)
Creatinine, Ser: 5.07 mg/dL — ABNORMAL HIGH (ref 0.61–1.24)
GFR calc Af Amer: 12 mL/min — ABNORMAL LOW (ref >60)
GFR calc non Af Amer: 11 mL/min — ABNORMAL LOW (ref >60)
GLUCOSE: 183 mg/dL — AB (ref 65–99)
PHOSPHORUS: 4.4 mg/dL (ref 2.5–4.6)
Potassium: 4.5 mmol/L (ref 3.5–5.1)
SODIUM: 142 mmol/L (ref 135–145)

## 2016-12-21 LAB — HEPATIC FUNCTION PANEL
ALT: 36 U/L (ref 17–63)
AST: 19 U/L (ref 15–41)
Albumin: 2.2 g/dL — ABNORMAL LOW (ref 3.5–5.0)
Alkaline Phosphatase: 62 U/L (ref 38–126)
Total Bilirubin: 0.6 mg/dL (ref 0.3–1.2)
Total Protein: 5.6 g/dL — ABNORMAL LOW (ref 6.5–8.1)

## 2016-12-21 LAB — PREPARE RBC (CROSSMATCH)

## 2016-12-21 SURGERY — ESOPHAGOGASTRODUODENOSCOPY (EGD) WITH PROPOFOL
Anesthesia: Monitor Anesthesia Care

## 2016-12-21 MED ORDER — GLYCOPYRROLATE 0.2 MG/ML IJ SOLN
INTRAMUSCULAR | Status: AC
  Filled 2016-12-21: qty 1

## 2016-12-21 MED ORDER — SODIUM CHLORIDE 0.9 % IV SOLN
Freq: Once | INTRAVENOUS | Status: AC
  Administered 2016-12-21: 09:00:00 via INTRAVENOUS

## 2016-12-21 MED ORDER — SODIUM CHLORIDE 0.45 % IV SOLN
INTRAVENOUS | Status: DC
  Administered 2016-12-21 – 2016-12-22 (×4): via INTRAVENOUS

## 2016-12-21 MED ORDER — MIDAZOLAM HCL 2 MG/2ML IJ SOLN
INTRAMUSCULAR | Status: AC
  Filled 2016-12-21: qty 2

## 2016-12-21 MED ORDER — MIDAZOLAM HCL 2 MG/2ML IJ SOLN
1.0000 mg | INTRAMUSCULAR | Status: DC
  Administered 2016-12-21: 2 mg via INTRAVENOUS

## 2016-12-21 MED ORDER — PROPOFOL 500 MG/50ML IV EMUL
INTRAVENOUS | Status: DC | PRN
  Administered 2016-12-21: 14:00:00 via INTRAVENOUS
  Administered 2016-12-21: 100 ug/kg/min via INTRAVENOUS

## 2016-12-21 MED ORDER — LIDOCAINE HCL (CARDIAC) 10 MG/ML IV SOLN
INTRAVENOUS | Status: DC | PRN
  Administered 2016-12-21: 30 mg via INTRAVENOUS

## 2016-12-21 MED ORDER — FENTANYL CITRATE (PF) 100 MCG/2ML IJ SOLN
25.0000 ug | Freq: Once | INTRAMUSCULAR | Status: AC
  Administered 2016-12-21: 25 ug via INTRAVENOUS

## 2016-12-21 MED ORDER — SODIUM CHLORIDE 0.9 % IV SOLN
INTRAVENOUS | Status: DC
  Administered 2016-12-21: 13:00:00 via INTRAVENOUS

## 2016-12-21 MED ORDER — SUCRALFATE 1 GM/10ML PO SUSP
1.0000 g | Freq: Three times a day (TID) | ORAL | Status: DC
  Administered 2016-12-21 – 2016-12-24 (×11): 1 g via ORAL
  Filled 2016-12-21 (×12): qty 10

## 2016-12-21 MED ORDER — FENTANYL CITRATE (PF) 100 MCG/2ML IJ SOLN
INTRAMUSCULAR | Status: AC
  Filled 2016-12-21: qty 2

## 2016-12-21 MED ORDER — SODIUM CHLORIDE 0.9 % IV SOLN
8.0000 mg/h | INTRAVENOUS | Status: DC
  Administered 2016-12-21 – 2016-12-22 (×3): 8 mg/h via INTRAVENOUS
  Filled 2016-12-21 (×8): qty 80

## 2016-12-21 MED ORDER — LACTATED RINGERS IV SOLN
INTRAVENOUS | Status: DC
  Administered 2016-12-21: 12:00:00 via INTRAVENOUS

## 2016-12-21 MED ORDER — LIDOCAINE VISCOUS 2 % MT SOLN
15.0000 mL | Freq: Once | OROMUCOSAL | Status: AC
  Administered 2016-12-21: 15 mL via OROMUCOSAL

## 2016-12-21 MED ORDER — GLYCOPYRROLATE 0.2 MG/ML IJ SOLN
0.2000 mg | Freq: Once | INTRAMUSCULAR | Status: AC | PRN
  Administered 2016-12-21: 0.2 mg via INTRAVENOUS

## 2016-12-21 MED ORDER — PROPOFOL 10 MG/ML IV BOLUS
INTRAVENOUS | Status: AC
  Filled 2016-12-21: qty 20

## 2016-12-21 MED ORDER — LIDOCAINE VISCOUS 2 % MT SOLN
OROMUCOSAL | Status: AC
  Filled 2016-12-21: qty 15

## 2016-12-21 MED ORDER — ARTIFICIAL TEARS OPHTHALMIC OINT
TOPICAL_OINTMENT | OPHTHALMIC | Status: AC
  Filled 2016-12-21: qty 7

## 2016-12-21 NOTE — Anesthesia Postprocedure Evaluation (Signed)
Anesthesia Post Note  Patient: Eric Scott  Procedure(s) Performed: Procedure(s) (LRB): ESOPHAGOGASTRODUODENOSCOPY (EGD) WITH PROPOFOL (N/A)  Patient location during evaluation: PACU Anesthesia Type: MAC Level of consciousness: awake and alert, oriented and patient cooperative Pain management: pain level controlled Vital Signs Assessment: post-procedure vital signs reviewed and stable Respiratory status: spontaneous breathing and patient connected to nasal cannula oxygen Cardiovascular status: stable Postop Assessment: no signs of nausea or vomiting Anesthetic complications: no     Last Vitals:  Vitals:   12/21/16 1317 12/21/16 1400  BP: (!) 145/77 138/89  Pulse:  84  Resp: 20 (!) 21  Temp: 36.6 C 36.5 C    Last Pain:  Vitals:   12/21/16 1317  TempSrc: Oral  PainSc:                  ADAMS, AMY A

## 2016-12-21 NOTE — Progress Notes (Signed)
Patient is seen in the short stay.  Just completed last unit of packed RBCs for hemoglobin of 7.6 morning. No active bleeding. Will proceed with EGD per plan.  The risks, benefits, limitations, alternatives and imponderables have been reviewed with the patient. Potential for esophageal dilation, biopsy, etc. have also been reviewed.  Questions have been answered. All parties agreeable.

## 2016-12-21 NOTE — Progress Notes (Signed)
PT Cancellation Note  Patient Details Name: Eric Scott MRN: [id number] DOB: 1950/09/14   Cancelled Treatment:    Reason Eval/Treat Not Completed: Patient not medically ready. Chart reviewed. Pt scheduled for EGD today and GI recommending 1u PRBC prior to procedure. Will hold PT eval at this time and try again at later date/time.   10:39 AM, 12/21/16 Etta Grandchild, PT, DPT Physical Therapist - [place] [phone] (Office)    Sarissa Dern C 12/21/2016, 10:38 AM

## 2016-12-21 NOTE — Anesthesia Procedure Notes (Signed)
Procedure Name: MAC Date/Time: 12/21/2016 1:25 PM Performed by: Andree Elk, AMY A Pre-anesthesia Checklist: Patient identified, Emergency Drugs available, Suction available, Patient being monitored and Timeout performed Oxygen Delivery Method: Simple face mask

## 2016-12-21 NOTE — Progress Notes (Signed)
CBC this morning show hgb 7.6. Prefer hgb > 8 for endoscopic procedure. 1 unit PRBC order placed prior to EGD

## 2016-12-21 NOTE — Progress Notes (Signed)
Subjective: Interval History: Patient denies any nausea or vomiting. Presently denies also any difficulty breathing.  Objective: Vital signs in last 24 hours: Temp:  [97.6 F (36.4 C)-98.1 F (36.7 C)] 97.6 F (36.4 C) (07/20 0422) Pulse Rate:  [73-90] 73 (07/20 0422) Resp:  [18-20] 18 (07/20 0422) BP: (119-152)/(61-86) 119/73 (07/20 0422) SpO2:  [93 %-98 %] 98 % (07/20 0729) Weight change:   Intake/Output from previous day: 07/19 0701 - 07/20 0700 In: 1108.8 [P.O.:520; I.V.:238.8; Blood:350] Out: 500 [Urine:500] Intake/Output this shift: No intake/output data recorded.  Patient is alert and in no apparent distress. Chest: Clear to auscultation Heart exam regular rate and rhythm Extremities no edema  Lab Results:  Recent Labs  12/20/16 1040 12/21/16 0727  WBC 13.7* 11.8*  HGB 7.5* 7.6*  HCT 22.2* 22.9*  PLT 243 217   BMET:   Recent Labs  12/20/16 1040 12/21/16 0727  NA 146* 142  K 4.3 4.5  CL 108 106  CO2 26 26  GLUCOSE 250* 183*  BUN 98* 99*  CREATININE 4.96* 5.07*  CALCIUM 8.1* 8.2*   No results for input(s): PTH in the last 72 hours. Iron Studies: No results for input(s): IRON, TIBC, TRANSFERRIN, FERRITIN in the last 72 hours.  Studies/Results: No results found.  I have reviewed the patient's current medications.  Assessment/Plan: Problem #1 altered mental status: Patient is alert . Has returned to his baseline. Problem #2 acute kidney injury: Patient is nonoliguric. Patient remains nonoliguric. He had about 500 mL of urine output and his by mouth intake is very limited.. Problem #3 low CO2 possibly metabolic: His CO2 is normal Problem #4 history of C. difficile colitis. Patient presently denies any more diarrhea. He denies also any abdominal pain. Patient is afebrile but his white blood cell counts remain high. Problem #5 hypertension: His blood pressure is reasonably controlled Problem #6 Bone and mineral disorder: His calcium and phosphorus is  in range. Problem #7 history of diabetes: Reasonably controlled Problem #8 history of CVA Problem #9 hypokalemia: His potassium is normal. Problem #10 anemia: His hemoglobin is low but stable. Patient is going to have endoscopy today. Problem #11 hypernatremia: Sodium has improved. Possibly lacunar free water. Plan: 1] We'll continue his hydration 2] we'll check CBC and renal panel in the morning   LOS: 11 days   Kailey Esquilin S 12/21/2016,8:42 AM

## 2016-12-21 NOTE — Care Management Important Message (Signed)
Important Message  Patient Details  Name: Eric Scott MRN: [id number] Date of Birth: 11-28-1950   Medicare Important Message Given:  Yes    Nakeshia Waldeck, Chauncey Reading, RN 12/21/2016, 3:51 PM

## 2016-12-21 NOTE — Transfer of Care (Signed)
Immediate Anesthesia Transfer of Care Note  Patient: Eric Scott  Procedure(s) Performed: Procedure(s): ESOPHAGOGASTRODUODENOSCOPY (EGD) WITH PROPOFOL (N/A)  Patient Location: PACU  Anesthesia Type:MAC  Level of Consciousness: awake, alert , oriented and patient cooperative  Airway & Oxygen Therapy: Patient Spontanous Breathing and Patient connected to nasal cannula oxygen  Post-op Assessment: Report given to RN and Post -op Vital signs reviewed and stable  Post vital signs: Reviewed and stable  Last Vitals:  Vitals:   12/21/16 1315 12/21/16 1317  BP: 140/77 (!) 145/77  Pulse:    Resp: 20 20  Temp: 36.6 C 36.6 C    Last Pain:  Vitals:   12/21/16 1317  TempSrc: Oral  PainSc:       Patients Stated Pain Goal: 2 (98/33/82 [id number])  Complications: No apparent anesthesia complications

## 2016-12-21 NOTE — Progress Notes (Signed)
Patient off floor for 15 minute vital signs post transfusion, transported to ENDO, for EGD. Accompanied by Carrie Mew RN to awaiting floor.

## 2016-12-21 NOTE — Anesthesia Preprocedure Evaluation (Signed)
Anesthesia Evaluation  Patient identified by MRN, date of birth, ID band Patient awake    Reviewed: Allergy & Precautions, NPO status , Patient's Chart, lab work & pertinent test results  Airway        Dental   Pulmonary COPD ( Hx hypoxemic with respiratory failure ), former smoker,    breath sounds clear to auscultation       Cardiovascular hypertension, Pt. on medications + Peripheral Vascular Disease and +CHF   Rhythm:Regular Rate:Normal     Neuro/Psych PSYCHIATRIC DISORDERS Anxiety Depression  Neuromuscular disease CVA    GI/Hepatic   Endo/Other  diabetes  Renal/GU ESRFRenal disease     Musculoskeletal   Abdominal   Peds  Hematology   Anesthesia Other Findings   Reproductive/Obstetrics                             Anesthesia Physical Anesthesia Plan  ASA: IV  Anesthesia Plan: MAC   Post-op Pain Management:    Induction: Intravenous  PONV Risk Score and Plan:   Airway Management Planned: Simple Face Mask  Additional Equipment:   Intra-op Plan:   Post-operative Plan:   Informed Consent: I have reviewed the patients History and Physical, chart, labs and discussed the procedure including the risks, benefits and alternatives for the proposed anesthesia with the patient or authorized representative who has indicated his/her understanding and acceptance.     Plan Discussed with:   Anesthesia Plan Comments:         Anesthesia Quick Evaluation

## 2016-12-21 NOTE — Op Note (Signed)
[facility] Patient Name: Eric Scott Procedure Date: 12/21/2016 1:26 PM MRN: [id number] Date of Birth: Jun 02, 1951 Attending MD: Norvel Richards , MD CSN: [id number] Age: 66 Admit Type: Inpatient Procedure:                Upper GI endoscopy Indications:              Melena Providers:                Norvel Richards, MD, Lurline Del, RN, Bonnetta Barry, Technician Referring MD:              Medicines:                Propofol per Anesthesia Complications:            No immediate complications. Estimated Blood Loss:     Estimated blood loss was minimal. Procedure:                Pre-Anesthesia Assessment:                           - Prior to the procedure, a History and Physical                            was performed, and patient medications and                            allergies were reviewed. The patient's tolerance of                            previous anesthesia was also reviewed. The risks                            and benefits of the procedure and the sedation                            options and risks were discussed with the patient.                            All questions were answered, and informed consent                            was obtained. Prior Anticoagulants: The patient                            last took heparin 5 days prior to the procedure.                            ASA Grade Assessment: IV - A patient with severe                            systemic disease that is a constant threat to life.  After reviewing the risks and benefits, the patient                            was deemed in satisfactory condition to undergo the                            procedure.                           After obtaining informed consent, the endoscope was                            passed under direct vision. Throughout the                            procedure, the patient's blood pressure, pulse, and                    oxygen saturations were monitored continuously. The                            EG-[id number] ([id number]) scope was introduced through the                            mouth, and advanced to the third part of duodenum.                            The upper GI endoscopy was accomplished without                            difficulty. The patient tolerated the procedure                            well. The patient tolerated the procedure well. Scope In: 1:42:03 PM Scope Out: 1:51:28 PM Total Procedure Duration: 0 hours 9 minutes 25 seconds  Findings:      ulcerative and erosive Esophagitis was found. Barrett's epithelium not       apparent.      Multiple erosions were found in the entire / erythematous mucosa       diffusely. Small hiatal hernia.      extensive "geographic" ulceration of the posterior bulb and a good part       of the second portion of the duodenum. extensive "cratering" present. No       specific suspect lesion identified, however. There was no active       bleeding. Biopsies of abnormal gastric mucosa taken for histologic study Impression:               - Ulcerative reflux Esophagitis.                           - Erosive gastropathy.                           - extensive duodenal ulceration?"likely site of  bleeding. No focal lesion to treat endoscopically                           - gastric biopsy taken.. Moderate Sedation:      Moderate (conscious) sedation was personally administered by an       anesthesia professional. The following parameters were monitored: oxygen       saturation, heart rate, blood pressure, respiratory rate, EKG, adequacy       of pulmonary ventilation, and response to care. Total physician       intraservice time was 20 minutes. Recommendation:           - Return patient to hospital ward for ongoing care.                            Change proton pump inhibitor therapy to intravenous                             infusion.                           - Clear liquid diet. Add Carafate suspension 4                            times a day                           - Continue present medications. Risk of                            anticoagulation currently prohibitive. We will                            immediately start treatment for H. pylori if                            biopsies are positive. If no further bleeding in                            the next 48-72 hours, can changeacid suppression                            therapy back to twice a day PPI dosing. Dr. Laural Golden                            will be seeing him over the weekend. Procedure Code(s):        --- Professional ---                           [id number], Esophagogastroduodenoscopy, flexible,                            transoral; diagnostic, including collection of                            specimen(s) by brushing or washing, when performed                            (  separate procedure) Diagnosis Code(s):        --- Professional ---                           K20.9, Esophagitis, unspecified                           K31.89, Other diseases of stomach and duodenum                           K26.9, Duodenal ulcer, unspecified as acute or                            chronic, without hemorrhage or perforation                           K92.1, Melena (includes Hematochezia) CPT copyright 2016 [employer]. All rights reserved. The codes documented in this report are preliminary and upon coder review may  be revised to meet current compliance requirements. Cristopher Estimable. Jaydeen Darley, MD Norvel Richards, MD 12/21/2016 2:16:51 PM This report has been signed electronically. Number of Addenda: 0

## 2016-12-21 NOTE — Progress Notes (Signed)
PROGRESS NOTE    Eric Scott  BJS:[id number] DOB: 07-04-1950 DOA: 12/10/2016 PCP: [name], MD    Brief Narrative:  66 year old male who presented with altered mental status. Patient is known to have anxiety, chronic pain syndrome, osteomyelitis, diabetes mellitus type 2, peripheral vascular disease and prior history of CVA. Patient presented to hospital pain unresponsive. His initial vital signs blood pressure 112/65, heart rate 106, respiratory rate 18, oxygen saturation 96%. Moist mucous membranes, lungs clear to auscultation bilaterally, heart S1-S2 present rhythmic, the abdomen was soft nontender, no lower extremity edema. Chemistry with significant renal failure. Patient had urgent hemodialysis. His hospitalization was complicated by C. difficile and right upper extremity deep vein thrombosis.   Assessment & Plan:   Principal Problem:   Uremia Active Problems:   DM type 2 (diabetes mellitus, type 2) ([name])   HTN (hypertension)   C. difficile diarrhea   Hypokalemia   COPD with exacerbation ([name])   AKI (acute kidney injury) ([place])   Osteomyelitis ([name])   Volume depletion   Hypotension   Arm DVT (deep venous thromboembolism), acute, right ([name])   Transaminitis   UGI bleed   1. New GI bleeding likely upper vs lower. After 2 units pf prbc, hb at 7.6, received 3rd unit prbc, before endoscopic procedure with good toleration,. EGD with erosive gastritis and bleeding duodenal ulcer, placed on protonix drip, Continue to hold on anticoagulation.   1. AKI on CKD stage V. Continue worsening renal function per cr, up to 5,0, with serum K at 4,5 and bicarbonate at 26, will continue to follow renal function in am, urine output over last 24 hours 500 cc, not oliguric but worsening.   2. C diff.  On PO vancomycin, wbc trending down to 11 from 13.   3. Chronic osteomyelitis. Continue to hold on  antibiotics, outpatient follow up with orthopedic surgery.    4. Right upper extremity  DVT associated with PICC line. Unable to continue anticoagulation due to GI bleeding, will continue close monitoring the edema.  5. T2DM. Will continue gucose cover and monitoring with insulin sliding scale. Capillary glucose 166, 154, 145, 135.  6. Hypertension. Continue with metoprolol po, blood pressure systolic 607.   DVT prophylaxis:heparin IV  Code Status:full  Family Communication: Disposition Plan:   Consultants:    Procedures:    Antimicrobials:     Subjective: Patient with no nausea or vomiting, no dyspnea or chest pain. Had EGD this am, PRBC transfusion and started on protonix drip.   Objective: Vitals:   12/21/16 1315 12/21/16 1317 12/21/16 1400 12/21/16 1415  BP: 140/77 (!) 145/77 138/89 134/89  Pulse:   84 81  Resp: 20 20 (!) 21 14  Temp: 97.8 F (36.6 C) 97.8 F (36.6 C) 97.7 F (36.5 C)   TempSrc: Oral Oral    SpO2: 97% 96% 98% 100%  Weight:      Height:        Intake/Output Summary (Last 24 hours) at 12/21/16 1442 Last data filed at 12/21/16 1403  Gross per 24 hour  Intake          1708.83 ml  Output             1150 ml  Net           558.83 ml   Filed Weights   12/13/16 1245 12/14/16 0500 12/15/16 1215  Weight: 115.5 kg (254 lb 10.1 oz) 117.9 kg (259 lb 14.8 oz) 117.9 kg (259 lb 14.8 oz)  Examination:  General exam: deconditioned and ill looking appearing E ENT. Positive pallor, no icterus, oral mucosa dry.  Respiratory system: Clear to auscultation. Respiratory effort normal. Cardiovascular system: S1 & S2 heard, RRR. No JVD, murmurs, rubs, gallops or clicks. No pedal edema. Gastrointestinal system: Abdomen is nondistended, soft and nontender. No organomegaly or masses felt. Normal bowel sounds heard. Central nervous system: Alert and oriented. No focal neurological deficits. Extremities: Symmetric 5 x 5 power. Skin: No rashes, lesions or ulcers    Data Reviewed: I have personally reviewed following labs and  imaging studies  CBC:  Recent Labs Lab 12/19/16 0638 12/19/16 2237 12/20/16 0131 12/20/16 1040 12/21/16 0727  WBC 13.4* 15.2* 14.2* 13.7* 11.8*  NEUTROABS  --  11.4* 10.8*  --   --   HGB 8.5* 7.1* 6.6* 7.5* 7.6*  HCT 25.1* 21.0* 19.6* 22.2* 22.9*  MCV 97.7 97.2 97.5 96.1 95.4  PLT 210 251 228 243 614   Basic Metabolic Panel:  Recent Labs Lab 12/15/16 0533 12/16/16 0648 12/17/16 0735 12/18/16 0740 12/19/16 0638 12/20/16 1040 12/21/16 0727  NA 140 142 145 140 140 146* 142  K 2.7* 2.9* 3.4* 3.2* 3.9 4.3 4.5  CL 106 105 107 104 105 108 106  CO2 23 26 25 25 26 26 26   GLUCOSE 188* 197* 147* 139* 179* 250* 183*  BUN 54* 37* 44* 50* 66* 98* 99*  CREATININE 4.95* 3.50* 4.15* 4.71* 4.71* 4.96* 5.07*  CALCIUM 7.4* 7.7* 7.8* 7.6* 7.7* 8.1* 8.2*  MG 1.4* 1.9  --   --   --   --   --   PHOS  --   --   --  3.7 3.3  --  4.4   GFR: Estimated Creatinine Clearance: 19 mL/min (A) (by C-G formula based on SCr of 5.07 mg/dL (H)). Liver Function Tests:  Recent Labs Lab 12/18/16 0740 12/19/16 4315 12/21/16 0727  ALBUMIN 1.9* 2.0* 2.1*   No results for input(s): LIPASE, AMYLASE in the last 168 hours. No results for input(s): AMMONIA in the last 168 hours. Coagulation Profile: No results for input(s): INR, PROTIME in the last 168 hours. Cardiac Enzymes: No results for input(s): CKTOTAL, CKMB, CKMBINDEX, TROPONINI in the last 168 hours. BNP (last 3 results) No results for input(s): PROBNP in the last 8760 hours. HbA1C: No results for input(s): HGBA1C in the last 72 hours. CBG:  Recent Labs Lab 12/20/16 2357 12/21/16 0410 12/21/16 0739 12/21/16 1108 12/21/16 1414  GLUCAP 223* 170* 166* 154* 145*   Lipid Profile: No results for input(s): CHOL, HDL, LDLCALC, TRIG, CHOLHDL, LDLDIRECT in the last 72 hours. Thyroid Function Tests: No results for input(s): TSH, T4TOTAL, FREET4, T3FREE, THYROIDAB in the last 72 hours. Anemia Panel: No results for input(s): VITAMINB12, FOLATE,  FERRITIN, TIBC, IRON, RETICCTPCT in the last 72 hours. Sepsis Labs: No results for input(s): PROCALCITON, LATICACIDVEN in the last 168 hours.  Recent Results (from the past 240 hour(s))  MRSA PCR Screening     Status: None   Collection Time: 12/14/16  8:25 AM  Result Value Ref Range Status   MRSA by PCR NEGATIVE NEGATIVE Final    Comment:        The GeneXpert MRSA Assay (FDA approved for NASAL specimens only), is one component of a comprehensive MRSA colonization surveillance program. It is not intended to diagnose MRSA infection nor to guide or monitor treatment for MRSA infections.          Radiology Studies: No results found.  Scheduled Meds: . chlorhexidine  15 mL Mouth Rinse BID  . collagenase   Topical Daily  . feeding supplement (ENSURE ENLIVE)  237 mL Oral TID BM  . fluticasone furoate-vilanterol  1 puff Inhalation Daily  . insulin aspart  0-9 Units Subcutaneous Q4H  . mouth rinse  15 mL Mouth Rinse q12n4p  . metoprolol tartrate  25 mg Oral BID  . multivitamin with minerals  1 tablet Oral Daily  . nicotine  21 mg Transdermal Daily  . saccharomyces boulardii  250 mg Oral BID  . sodium chloride flush  10-40 mL Intracatheter Q12H  . sucralfate  1 g Oral TID WC & HS  . vancomycin  125 mg Oral Q6H   Continuous Infusions: . sodium chloride 75 mL/hr at 12/21/16 0901  . sodium chloride 10 mL/hr at 12/19/16 1210  . pantoprozole (PROTONIX) infusion       LOS: 11 days     Letica Giaimo Gerome Apley, MD [facility] Pager [phone]  If 7PM-7AM, please contact night-coverage [email or web address] Password [email or web address] 12/21/2016, 2:42 PM

## 2016-12-21 NOTE — Clinical Social Work Note (Signed)
LCSW provided status update to [place] at [employer] ([facility]).       Dixie Jafri, Clydene Pugh, LCSW

## 2016-12-22 DIAGNOSIS — K921 Melena: Secondary | ICD-10-CM

## 2016-12-22 DIAGNOSIS — K209 Esophagitis, unspecified: Secondary | ICD-10-CM

## 2016-12-22 DIAGNOSIS — K269 Duodenal ulcer, unspecified as acute or chronic, without hemorrhage or perforation: Secondary | ICD-10-CM

## 2016-12-22 LAB — TYPE AND SCREEN
ABO/RH(D): O NEG
Antibody Screen: NEGATIVE
UNIT DIVISION: 0
UNIT DIVISION: 0
Unit division: 0

## 2016-12-22 LAB — CBC
HCT: 26.2 % — ABNORMAL LOW (ref 39.0–52.0)
HEMOGLOBIN: 9 g/dL — AB (ref 13.0–17.0)
MCH: 32.5 pg (ref 26.0–34.0)
MCHC: 34.4 g/dL (ref 30.0–36.0)
MCV: 94.6 fL (ref 78.0–100.0)
Platelets: 257 10*3/uL (ref 150–400)
RBC: 2.77 MIL/uL — AB (ref 4.22–5.81)
RDW: 17.5 % — ABNORMAL HIGH (ref 11.5–15.5)
WBC: 14.1 10*3/uL — ABNORMAL HIGH (ref 4.0–10.5)

## 2016-12-22 LAB — BPAM RBC
BLOOD PRODUCT EXPIRATION DATE: 201808022359
Blood Product Expiration Date: 201808082359
Blood Product Expiration Date: 201808082359
ISSUE DATE / TIME: 201807191411
ISSUE DATE / TIME: 201807190347
ISSUE DATE / TIME: 201807201044
UNIT TYPE AND RH: 9500
UNIT TYPE AND RH: 9500
Unit Type and Rh: 9500

## 2016-12-22 LAB — RENAL FUNCTION PANEL
Albumin: 2.5 g/dL — ABNORMAL LOW (ref 3.5–5.0)
Anion gap: 13 (ref 5–15)
BUN: 91 mg/dL — AB (ref 6–20)
CALCIUM: 8.6 mg/dL — AB (ref 8.9–10.3)
CHLORIDE: 108 mmol/L (ref 101–111)
CO2: 24 mmol/L (ref 22–32)
CREATININE: 5.06 mg/dL — AB (ref 0.61–1.24)
GFR calc Af Amer: 12 mL/min — ABNORMAL LOW (ref >60)
GFR, EST NON AFRICAN AMERICAN: 11 mL/min — AB (ref >60)
Glucose, Bld: 183 mg/dL — ABNORMAL HIGH (ref 65–99)
Phosphorus: 4.3 mg/dL (ref 2.5–4.6)
Potassium: 4.3 mmol/L (ref 3.5–5.1)
SODIUM: 145 mmol/L (ref 135–145)

## 2016-12-22 LAB — GLUCOSE, CAPILLARY
GLUCOSE-CAPILLARY: 173 mg/dL — AB (ref 65–99)
GLUCOSE-CAPILLARY: 175 mg/dL — AB (ref 65–99)
GLUCOSE-CAPILLARY: 162 mg/dL — AB (ref 65–99)
Glucose-Capillary: 152 mg/dL — ABNORMAL HIGH (ref 65–99)
Glucose-Capillary: 164 mg/dL — ABNORMAL HIGH (ref 65–99)
Glucose-Capillary: 176 mg/dL — ABNORMAL HIGH (ref 65–99)
Glucose-Capillary: 181 mg/dL — ABNORMAL HIGH (ref 65–99)

## 2016-12-22 NOTE — Progress Notes (Signed)
Subjective: Interval History: Patient offers no complaints. He denies any nausea or vomiting. Patient also denies any difficulty breathing.  Objective: Vital signs in last 24 hours: Temp:  [97.6 F (36.4 C)-98.1 F (36.7 C)] 98.1 F (36.7 C) (07/21 0645) Pulse Rate:  [72-98] 98 (07/21 0645) Resp:  [14-28] 20 (07/21 0645) BP: (125-148)/(64-89) 145/70 (07/21 0645) SpO2:  [92 %-100 %] 93 % (07/21 0735) Weight change:   Intake/Output from previous day: 07/20 0701 - 07/21 0700 In: 2554.4 [P.O.:240; I.V.:1994.4; Blood:320] Out: 1300 [Urine:1300] Intake/Output this shift: No intake/output data recorded.  Patient is alert and in no apparent distress. Chest: Clear to auscultation Heart exam regular rate and rhythm Extremities no edema  Lab Results:  Recent Labs  12/21/16 0727 12/21/16 1651 12/22/16 0600  WBC 11.8*  --  14.1*  HGB 7.6* 8.5* 9.0*  HCT 22.9* 25.5* 26.2*  PLT 217  --  257   BMET:   Recent Labs  12/21/16 0727 12/22/16 0600  NA 142 145  K 4.5 4.3  CL 106 108  CO2 26 24  GLUCOSE 183* 183*  BUN 99* 91*  CREATININE 5.07* 5.06*  CALCIUM 8.2* 8.6*   No results for input(s): PTH in the last 72 hours. Iron Studies: No results for input(s): IRON, TIBC, TRANSFERRIN, FERRITIN in the last 72 hours.  Studies/Results: No results found.  I have reviewed the patient's current medications.  Assessment/Plan: Problem #1 altered mental status: Patient is alert . Has returned to his baseline. Problem #2 acute kidney injury: Patient is nonoliguric. Patient remains nonoliguric. His urine output is 1500. Presently his renal function is stable. Still the problem is poor by mouth intake and possibly some diarrhea. Patient started on IV fluid yesterday with some improvement. Problem #3 low CO2 possibly metabolic: His CO2 is normal Problem #4 history of C. difficile colitis. Patient presently denies any more diarrhea. He denies also any abdominal pain. Patient is afebrile but  his white blood cell counts remain high. Problem #5 hypertension: His blood pressure is reasonably controlled Problem #6 Bone and mineral disorder: His calcium and phosphorus is in range. Problem #7 history of diabetes: Reasonably controlled Problem #8 history of CVA Problem #9 hypokalemia: His potassium is normal. Problem #10 anemia: Secondary to GI bleeding. Patient has received blood transfusion and his hemoglobin has improved. Problem #11 hypernatremia: Sodium has improved.  Plan: 1] We'll continue his hydration 2] we'll check CBC and renal panel in the morning  3] discussed with the patient about increasing his by mouth intake. Presently patient doesn't need dialysis. He is going to be discharged will follow his renal function and make a decision.   LOS: 12 days   Shaquel Chavous S 12/22/2016,8:13 AM

## 2016-12-22 NOTE — Progress Notes (Signed)
  Subjective:  Patient has no complaints. He denies nausea vomiting or abdominal pain. No BM reported this morning. He is moist go back to nursing home.  Objective: Blood pressure (!) 145/70, pulse 98, temperature 98.1 F (36.7 C), temperature source Oral, resp. rate 20, height 6' (1.829 m), weight 259 lb 14.8 oz (117.9 kg), SpO2 93 %. Patient is alert and in no acute distress. Abdomen is full but soft and nontender without organomegaly or masses. No LE edema or clubbing noted. He has bilateral ankle/foot  dressing in place.  Labs/studies Results:   Recent Labs  12/20/16 1040 12/21/16 0727 12/21/16 1651 12/22/16 0600  WBC 13.7* 11.8*  --  14.1*  HGB 7.5* 7.6* 8.5* 9.0*  HCT 22.2* 22.9* 25.5* 26.2*  PLT 243 217  --  257    BMET   Recent Labs  12/20/16 1040 12/21/16 0727 12/22/16 0600  NA 146* 142 145  K 4.3 4.5 4.3  CL 108 106 108  CO2 26 26 24   GLUCOSE 250* 183* 183*  BUN 98* 99* 91*  CREATININE 4.96* 5.07* 5.06*  CALCIUM 8.1* 8.2* 8.6*    LFT   Recent Labs  12/21/16 0727 12/21/16 1651 12/22/16 0600  PROT  --  5.6*  --   ALBUMIN 2.1* 2.2* 2.5*  AST  --  19  --   ALT  --  36  --   ALKPHOS  --  62  --   BILITOT  --  0.6  --   BILIDIR  --  <0.1*  --   IBILI  --  NOT CALCULATED  --      Assessment:  #1. UGI bleed secondary to multiple bulbar ulcers. EGD also revealed erosive gastritis and erosive reflux esophagitis. Patient remains on pantoprazole infusion. He is tolerating clear liquids.  #2. Anemia. Acute on chronic anemia secondary to GI blood loss. Patient has received 2 units of PRBCs.  #3.CKD. Patient is being followed by Dr. Hinda Lenis.   Recommendations:  Advance diet to modified Carb renal diet. Continue pantoprazole infusion for another 24 hours. CBC with a.m. lab.

## 2016-12-22 NOTE — Progress Notes (Signed)
PROGRESS NOTE    Eric Scott  FYB:[id number] DOB: 1950-10-15 DOA: 12/10/2016 PCP: [name], MD    Brief Narrative:  66 year old male who presented with altered mental status. Patient is known to have anxiety, chronic pain syndrome, osteomyelitis, diabetes mellitus type 2, peripheral vascular disease and prior history of CVA. Patient presented to hospital pain unresponsive. His initial vital signs blood pressure 112/65, heart rate 106, respiratory rate 18, oxygen saturation 96%. Moist mucous membranes, lungs clear to auscultation bilaterally, heart S1-S2 present rhythmic, the abdomen was soft nontender, no lower extremity edema. Chemistry with significant renal failure. Patient had urgent hemodialysis. His hospitalization was complicated by C. difficile and right upper extremity deep vein thrombosis.   Assessment & Plan:   Principal Problem:   Uremia Active Problems:   DM type 2 (diabetes mellitus, type 2) ([name])   HTN (hypertension)   C. difficile diarrhea   Hypokalemia   COPD with exacerbation ([name])   AKI (acute kidney injury) ([place])   Osteomyelitis ([name])   Volume depletion   Hypotension   Arm DVT (deep venous thromboembolism), acute, right ([name])   Transaminitis   UGI bleed   1. New GI bleeding likely upper with erosive gastritis plus bleeding duodenal ulcer. Patient sp 3 units prbc transfused, hb stable at 9.0, IV protonix drip for 72 hours, will continue to monitor hb and hct, no active signs of bleeding. Hold on anticoagulation.   1. AKI on CKD stage V. Renal function stable with serum cr  5,0, with serum K at 4,3 and bicarbonate at 24, urine output over last 24 hours, at 1300 ml.  2. C diff. Continue PO vancomycin, symptomatic improvement of diarrhea. Positive leukocytosis with wbc at 14.   3. Chronic osteomyelitis. Off antibiotic therapy, plan to follow with orthopedics as outpatient.   4. Right upper extremity DVT associated with PICC line. Prohibitive  anticoagulation due to upper GI bleed and acute blood loss anemia. Will continue close monitoring of edema and pain. No dyspnea or chest pain. Risk of evolving into PE is lower compared to dvt of the lower extremities.   5. T2DM. Patient on insulin sliding scale. Capillary glucose 164, 173, 175, 162. Poor appetite but tolerating po well.   6. Hypertension.  Metoprolol po, blood pressure systolic 527 to 782.    DVT prophylaxis:heparin IV  Code Status:full  Family Communication: Disposition Plan:   Consultants:    Procedures:    Antimicrobials:   Oral vancomycin      Subjective: Patient feeling better, no nausea or vomiting, improved diarrhea, positive weakness, no chest pain, or dyspnea.   Objective: Vitals:   12/21/16 1445 12/21/16 2158 12/22/16 0645 12/22/16 0735  BP: 140/70 133/64 (!) 145/70   Pulse: 81 72 98   Resp: (!) 22  20   Temp: 97.6 F (36.4 C)  98.1 F (36.7 C)   TempSrc:   Oral   SpO2: 100%  97% 93%  Weight:      Height:        Intake/Output Summary (Last 24 hours) at 12/22/16 1106 Last data filed at 12/22/16 0300  Gross per 24 hour  Intake          2554.42 ml  Output             1300 ml  Net          1254.42 ml   Filed Weights   12/13/16 1245 12/14/16 0500 12/15/16 1215  Weight: 115.5 kg (254 lb 10.1 oz)  117.9 kg (259 lb 14.8 oz) 117.9 kg (259 lb 14.8 oz)    Examination:  General exam: not in pain or dyspnea E ENT: mild pallor or icterus. Oral mucosa dry.  Respiratory system: Clear to auscultation. Respiratory effort normal. No wheezing, rales or rhonchi.  Cardiovascular system: S1 & S2 heard, RRR. No JVD, murmurs, rubs, gallops or clicks. Trace lower extremity edema. Gastrointestinal system: Abdomen is nondistended, soft and nontender. No organomegaly or masses felt. Normal bowel sounds heard. Central nervous system: Alert and oriented. No focal neurological deficits. Extremities: Symmetric 5 x 5 power. Skin: No  rashes, lesions or ulcers   Data Reviewed: I have personally reviewed following labs and imaging studies  CBC:  Recent Labs Lab 12/19/16 2237 12/20/16 0131 12/20/16 1040 12/21/16 0727 12/21/16 1651 12/22/16 0600  WBC 15.2* 14.2* 13.7* 11.8*  --  14.1*  NEUTROABS 11.4* 10.8*  --   --   --   --   HGB 7.1* 6.6* 7.5* 7.6* 8.5* 9.0*  HCT 21.0* 19.6* 22.2* 22.9* 25.5* 26.2*  MCV 97.2 97.5 96.1 95.4  --  94.6  PLT 251 228 243 217  --  242   Basic Metabolic Panel:  Recent Labs Lab 12/16/16 0648  12/18/16 0740 12/19/16 0638 12/20/16 1040 12/21/16 0727 12/22/16 0600  NA 142  < > 140 140 146* 142 145  K 2.9*  < > 3.2* 3.9 4.3 4.5 4.3  CL 105  < > 104 105 108 106 108  CO2 26  < > 25 26 26 26 24   GLUCOSE 197*  < > 139* 179* 250* 183* 183*  BUN 37*  < > 50* 66* 98* 99* 91*  CREATININE 3.50*  < > 4.71* 4.71* 4.96* 5.07* 5.06*  CALCIUM 7.7*  < > 7.6* 7.7* 8.1* 8.2* 8.6*  MG 1.9  --   --   --   --   --   --   PHOS  --   --  3.7 3.3  --  4.4 4.3  < > = values in this interval not displayed. GFR: Estimated Creatinine Clearance: 19 mL/min (A) (by C-G formula based on SCr of 5.06 mg/dL (H)). Liver Function Tests:  Recent Labs Lab 12/18/16 0740 12/19/16 6834 12/21/16 0727 12/21/16 1651 12/22/16 0600  AST  --   --   --  19  --   ALT  --   --   --  36  --   ALKPHOS  --   --   --  62  --   BILITOT  --   --   --  0.6  --   PROT  --   --   --  5.6*  --   ALBUMIN 1.9* 2.0* 2.1* 2.2* 2.5*   No results for input(s): LIPASE, AMYLASE in the last 168 hours. No results for input(s): AMMONIA in the last 168 hours. Coagulation Profile: No results for input(s): INR, PROTIME in the last 168 hours. Cardiac Enzymes: No results for input(s): CKTOTAL, CKMB, CKMBINDEX, TROPONINI in the last 168 hours. BNP (last 3 results) No results for input(s): PROBNP in the last 8760 hours. HbA1C: No results for input(s): HGBA1C in the last 72 hours. CBG:  Recent Labs Lab 12/21/16 2009  12/22/16 0005 12/22/16 0414 12/22/16 0640 12/22/16 0752  GLUCAP 183* 164* 181* 173* 175*   Lipid Profile: No results for input(s): CHOL, HDL, LDLCALC, TRIG, CHOLHDL, LDLDIRECT in the last 72 hours. Thyroid Function Tests: No results for input(s): TSH, T4TOTAL, FREET4, T3FREE,  THYROIDAB in the last 72 hours. Anemia Panel: No results for input(s): VITAMINB12, FOLATE, FERRITIN, TIBC, IRON, RETICCTPCT in the last 72 hours. Sepsis Labs: No results for input(s): PROCALCITON, LATICACIDVEN in the last 168 hours.  Recent Results (from the past 240 hour(s))  MRSA PCR Screening     Status: None   Collection Time: 12/14/16  8:25 AM  Result Value Ref Range Status   MRSA by PCR NEGATIVE NEGATIVE Final    Comment:        The GeneXpert MRSA Assay (FDA approved for NASAL specimens only), is one component of a comprehensive MRSA colonization surveillance program. It is not intended to diagnose MRSA infection nor to guide or monitor treatment for MRSA infections.          Radiology Studies: No results found.      Scheduled Meds: . chlorhexidine  15 mL Mouth Rinse BID  . collagenase   Topical Daily  . feeding supplement (ENSURE ENLIVE)  237 mL Oral TID BM  . fluticasone furoate-vilanterol  1 puff Inhalation Daily  . insulin aspart  0-9 Units Subcutaneous Q4H  . mouth rinse  15 mL Mouth Rinse q12n4p  . metoprolol tartrate  25 mg Oral BID  . multivitamin with minerals  1 tablet Oral Daily  . nicotine  21 mg Transdermal Daily  . saccharomyces boulardii  250 mg Oral BID  . sodium chloride flush  10-40 mL Intracatheter Q12H  . sucralfate  1 g Oral TID WC & HS  . vancomycin  125 mg Oral Q6H   Continuous Infusions: . sodium chloride 135 mL/hr at 12/22/16 0827  . sodium chloride 10 mL/hr at 12/19/16 1210  . pantoprozole (PROTONIX) infusion 8 mg/hr (12/22/16 0159)     LOS: 12 days       Rilen Shukla Gerome Apley, MD [facility] Pager [phone]  If 7PM-7AM, please  contact night-coverage [email or web address] Password [email or web address] 12/22/2016, 11:06 AM

## 2016-12-22 NOTE — Addendum Note (Signed)
Addendum  created 12/22/16 1340 by Mickel Baas, CRNA   Sign clinical note

## 2016-12-22 NOTE — Anesthesia Postprocedure Evaluation (Signed)
Anesthesia Post Note  Patient: Eric Scott  Procedure(s) Performed: Procedure(s) (LRB): ESOPHAGOGASTRODUODENOSCOPY (EGD) WITH PROPOFOL (N/A)  Patient location during evaluation: Nursing Unit Anesthesia Type: MAC Level of consciousness: patient cooperative, oriented and awake and alert Pain management: pain level controlled Vital Signs Assessment: post-procedure vital signs reviewed and stable Respiratory status: spontaneous breathing and respiratory function stable Postop Assessment: no signs of nausea or vomiting Anesthetic complications: no     Last Vitals:  Vitals:   12/21/16 2158 12/22/16 0645  BP: 133/64 (!) 145/70  Pulse: 72 98  Resp:  20  Temp:  36.7 C    Last Pain:  Vitals:   12/22/16 0645  TempSrc: Oral  PainSc:                  Sole Lengacher A

## 2016-12-23 LAB — RENAL FUNCTION PANEL
ANION GAP: 11 (ref 5–15)
Albumin: 2.5 g/dL — ABNORMAL LOW (ref 3.5–5.0)
BUN: 83 mg/dL — AB (ref 6–20)
CHLORIDE: 106 mmol/L (ref 101–111)
CO2: 26 mmol/L (ref 22–32)
Calcium: 8.6 mg/dL — ABNORMAL LOW (ref 8.9–10.3)
Creatinine, Ser: 4.91 mg/dL — ABNORMAL HIGH (ref 0.61–1.24)
GFR calc Af Amer: 13 mL/min — ABNORMAL LOW (ref >60)
GFR calc non Af Amer: 11 mL/min — ABNORMAL LOW (ref >60)
GLUCOSE: 190 mg/dL — AB (ref 65–99)
POTASSIUM: 4.1 mmol/L (ref 3.5–5.1)
Phosphorus: 4.2 mg/dL (ref 2.5–4.6)
Sodium: 143 mmol/L (ref 135–145)

## 2016-12-23 LAB — CBC
HEMATOCRIT: 27 % — AB (ref 39.0–52.0)
HEMOGLOBIN: 9 g/dL — AB (ref 13.0–17.0)
MCH: 31.9 pg (ref 26.0–34.0)
MCHC: 33.3 g/dL (ref 30.0–36.0)
MCV: 95.7 fL (ref 78.0–100.0)
Platelets: 260 10*3/uL (ref 150–400)
RBC: 2.82 MIL/uL — ABNORMAL LOW (ref 4.22–5.81)
RDW: 17 % — AB (ref 11.5–15.5)
WBC: 11.2 10*3/uL — ABNORMAL HIGH (ref 4.0–10.5)

## 2016-12-23 LAB — GLUCOSE, CAPILLARY
GLUCOSE-CAPILLARY: 171 mg/dL — AB (ref 65–99)
GLUCOSE-CAPILLARY: 150 mg/dL — AB (ref 65–99)
GLUCOSE-CAPILLARY: 148 mg/dL — AB (ref 65–99)
Glucose-Capillary: 136 mg/dL — ABNORMAL HIGH (ref 65–99)
Glucose-Capillary: 160 mg/dL — ABNORMAL HIGH (ref 65–99)
Glucose-Capillary: 186 mg/dL — ABNORMAL HIGH (ref 65–99)

## 2016-12-23 MED ORDER — PANTOPRAZOLE SODIUM 40 MG PO TBEC
40.0000 mg | DELAYED_RELEASE_TABLET | Freq: Two times a day (BID) | ORAL | Status: DC
  Administered 2016-12-23 – 2016-12-24 (×2): 40 mg via ORAL
  Filled 2016-12-23 (×2): qty 1

## 2016-12-23 NOTE — Progress Notes (Signed)
Patient pulled out IV.  Patient refusing to allow IV to be replaced, explained the need for the IV.  Patient states he don't need it and that he is leaving in the morning, so he does not want to be stuck again.  Tried several times to talk patient into letting the IV be restarted without success.

## 2016-12-23 NOTE — Progress Notes (Signed)
Have tried several times since 2200 when patient pulled IV out to talk patient into allowing an IV to be put back in, patient keeps refusing.  Patient is A & O x3.

## 2016-12-23 NOTE — Progress Notes (Signed)
Subjective: Interval History: He denies any nausea or vomiting. Patient also denies any difficulty breathing. Patient is feeling okay and no new issues.  Objective: Vital signs in last 24 hours: Temp:  [97.7 F (36.5 C)-98 F (36.7 C)] 97.7 F (36.5 C) (07/22 0430) Pulse Rate:  [60-96] 96 (07/22 0430) Resp:  [18-20] 20 (07/22 0430) BP: (132-167)/(44-82) 167/73 (07/22 0430) SpO2:  [98 %-100 %] 100 % (07/22 0744) Weight change:   Intake/Output from previous day: 07/21 0701 - 07/22 0700 In: 480 [P.O.:480] Out: 900 [Urine:900] Intake/Output this shift: No intake/output data recorded.  Patient is alert and in no apparent distress. Chest: Clear to auscultation Heart exam regular rate and rhythm Extremities no edema  Lab Results:  Recent Labs  12/22/16 0600 12/23/16 0651  WBC 14.1* 11.2*  HGB 9.0* 9.0*  HCT 26.2* 27.0*  PLT 257 260   BMET:   Recent Labs  12/21/16 0727 12/22/16 0600  NA 142 145  K 4.5 4.3  CL 106 108  CO2 26 24  GLUCOSE 183* 183*  BUN 99* 91*  CREATININE 5.07* 5.06*  CALCIUM 8.2* 8.6*   No results for input(s): PTH in the last 72 hours. Iron Studies: No results for input(s): IRON, TIBC, TRANSFERRIN, FERRITIN in the last 72 hours.  Studies/Results: No results found.  I have reviewed the patient's current medications.  Assessment/Plan: Problem #1 altered mental status: Patient is alert and oriented. Ask me when he is going to go home. Problem #2 acute kidney injury: Patient is nonoliguric. He had 900 mL of urine output. According to the nursing note patient refused to start IV fluid hence he was not hydrated. His by mouth intake seems to be also limited. His creatinine is 4.91 seems to be improving. Problem #3 low CO2 possibly metabolic: His CO2 is normal Problem #4 history of C. difficile colitis. Patient presently denies any more diarrhea. He denies also any abdominal pain. Patient is afebrile and  his white blood cell counts Is also  improving. Problem #5 hypertension: His blood pressure is reasonably controlled Problem #6 Bone and mineral disorder: His calcium and phosphorus is in range. Problem #7 history of diabetes: Reasonably controlled Problem #8 history of CVA Problem #9 hypokalemia: His potassium is normal. Problem #10 anemia: Secondary to GI bleeding. His hemoglobin has remained stable. Problem #11 hypernatremia: Sodium has improved.  Plan: 1] patient encouraged to increase his fluid intake. 2] we'll check CBC and renal panel in the morning  3] Presently patient doesn't need dialysis. He is going to be discharged will follow his renal function and make a decision.   LOS: 13 days   Glade Strausser S 12/23/2016,8:36 AM

## 2016-12-23 NOTE — Progress Notes (Signed)
PROGRESS NOTE    Eric Scott  EGB:[id number] DOB: Dec 01, 1950 DOA: 12/10/2016 PCP: [name], MD    Brief Narrative:  66 year old male who presented with altered mental status. Patient is known to have anxiety, chronic pain syndrome, osteomyelitis, diabetes mellitus type 2, peripheral vascular disease and prior history of CVA. Patient presented to hospital pain unresponsive. His initial vital signs blood pressure 112/65, heart rate 106, respiratory rate 18, oxygen saturation 96%. Moist mucous membranes, lungs clear to auscultation bilaterally, heart S1-S2 present rhythmic, the abdomen was soft nontender, no lower extremity edema. Chemistry with significant renal failure. Patient had urgent hemodialysis. His hospitalization was complicated by C. difficile and right upper extremity deep vein thrombosis.   Assessment & Plan:   Principal Problem:   Uremia Active Problems:   DM type 2 (diabetes mellitus, type 2) ([name])   HTN (hypertension)   C. difficile diarrhea   Hypokalemia   COPD with exacerbation ([name])   AKI (acute kidney injury) ([place])   Osteomyelitis ([name])   Volume depletion   Hypotension   Arm DVT (deep venous thromboembolism), acute, right ([name])   Transaminitis   UGI bleed  1. New GI bleeding likely upper with erosive gastritis plus bleeding duodenal ulcer. SP 3 units prbc transfused, hb continue to be stable at 9.0. Patient loss IV access, will change protonix to po bid. Poor oral intake, but no nausea or vomiting.   1. AKI on CKD stage V. Renal function stable with serum cr 4,9  with serum K at 4,1 and bicarbonate at 26, urine output over last 24 hours, at 900 ml. Patient with no IV access, continue hydration per mouth, will follow on renal panel in am. Avoid hypotension or nephrotoxic medications.   2. C diff.  On enteral vancomycin, diarrhea is improved per patient's report.   3. Chronic osteomyelitis. Plan to follow with orthopedics as outpatient.   4. Right  upper extremity DVT associated with PICC line. Continue to be prohibitive anticoagulation due to upper GI bleed and acute blood loss anemia.  5. T2DM. Continue  insulin sliding scale. Capillary glucose 152, 186, 160, 171, 136, patient with poor appetite.   6. Hypertension. On Metoprolol po, blood pressure 371 to 062 systolic.    DVT prophylaxis:heparin IV  Code Status:full  Family Communication: Disposition Plan:   Consultants:    Procedures:    Antimicrobials:   Oral vancomycin    Subjective: Patient feeling well, positive weakness, very anxious to leave the hospital, no chest pain, no dyspnea and reported decreased diarrhea. No nausea or vomiting, poor oral intake.   Objective: Vitals:   12/22/16 1407 12/22/16 2028 12/23/16 0430 12/23/16 0744  BP: (!) 132/44 (!) 164/82 (!) 167/73   Pulse: 60 88 96   Resp: 18 18 20    Temp: 97.7 F (36.5 C) 98 F (36.7 C) 97.7 F (36.5 C)   TempSrc: Oral Oral Oral   SpO2: 98% 98% 100% 100%  Weight:      Height:        Intake/Output Summary (Last 24 hours) at 12/23/16 0907 Last data filed at 12/22/16 1524  Gross per 24 hour  Intake              240 ml  Output              900 ml  Net             -660 ml   Filed Weights   12/13/16 1245 12/14/16 0500 12/15/16  1215  Weight: 115.5 kg (254 lb 10.1 oz) 117.9 kg (259 lb 14.8 oz) 117.9 kg (259 lb 14.8 oz)    Examination:  General exam: deconditioned E ENT: mild pallor, no icterus, oral mucosa moist.   Respiratory system: mild decreased breath sounds at bases, with no wheezing, rales or rhonchi.  Respiratory effort normal. Cardiovascular system: S1 & S2 heard, RRR. No JVD, murmurs, rubs, gallops or clicks. No pedal edema. Gastrointestinal system: Abdomen is protuberant nondistended, soft and nontender. No organomegaly or masses felt. Normal bowel sounds heard. Central nervous system: Alert and oriented. No focal neurological deficits. Extremities: Symmetric 5 x  5 power. Skin: feet with dressing in place.      Data Reviewed: I have personally reviewed following labs and imaging studies  CBC:  Recent Labs Lab 12/19/16 2237 12/20/16 0131 12/20/16 1040 12/21/16 0727 12/21/16 1651 12/22/16 0600 12/23/16 0651  WBC 15.2* 14.2* 13.7* 11.8*  --  14.1* 11.2*  NEUTROABS 11.4* 10.8*  --   --   --   --   --   HGB 7.1* 6.6* 7.5* 7.6* 8.5* 9.0* 9.0*  HCT 21.0* 19.6* 22.2* 22.9* 25.5* 26.2* 27.0*  MCV 97.2 97.5 96.1 95.4  --  94.6 95.7  PLT 251 228 243 217  --  257 638   Basic Metabolic Panel:  Recent Labs Lab 12/18/16 0740 12/19/16 0638 12/20/16 1040 12/21/16 0727 12/22/16 0600 12/23/16 0651  NA 140 140 146* 142 145 143  K 3.2* 3.9 4.3 4.5 4.3 4.1  CL 104 105 108 106 108 106  CO2 25 26 26 26 24 26   GLUCOSE 139* 179* 250* 183* 183* 190*  BUN 50* 66* 98* 99* 91* 83*  CREATININE 4.71* 4.71* 4.96* 5.07* 5.06* 4.91*  CALCIUM 7.6* 7.7* 8.1* 8.2* 8.6* 8.6*  PHOS 3.7 3.3  --  4.4 4.3 4.2   GFR: Estimated Creatinine Clearance: 19.6 mL/min (A) (by C-G formula based on SCr of 4.91 mg/dL (H)). Liver Function Tests:  Recent Labs Lab 12/19/16 9373 12/21/16 0727 12/21/16 1651 12/22/16 0600 12/23/16 0651  AST  --   --  19  --   --   ALT  --   --  36  --   --   ALKPHOS  --   --  62  --   --   BILITOT  --   --  0.6  --   --   PROT  --   --  5.6*  --   --   ALBUMIN 2.0* 2.1* 2.2* 2.5* 2.5*   No results for input(s): LIPASE, AMYLASE in the last 168 hours. No results for input(s): AMMONIA in the last 168 hours. Coagulation Profile: No results for input(s): INR, PROTIME in the last 168 hours. Cardiac Enzymes: No results for input(s): CKTOTAL, CKMB, CKMBINDEX, TROPONINI in the last 168 hours. BNP (last 3 results) No results for input(s): PROBNP in the last 8760 hours. HbA1C: No results for input(s): HGBA1C in the last 72 hours. CBG:  Recent Labs Lab 12/22/16 1616 12/22/16 2026 12/23/16 0011 12/23/16 0425 12/23/16 0729  GLUCAP  176* 152* 186* 160* 171*   Lipid Profile: No results for input(s): CHOL, HDL, LDLCALC, TRIG, CHOLHDL, LDLDIRECT in the last 72 hours. Thyroid Function Tests: No results for input(s): TSH, T4TOTAL, FREET4, T3FREE, THYROIDAB in the last 72 hours. Anemia Panel: No results for input(s): VITAMINB12, FOLATE, FERRITIN, TIBC, IRON, RETICCTPCT in the last 72 hours. Sepsis Labs: No results for input(s): PROCALCITON, LATICACIDVEN in the last 168 hours.  Recent Results (from the past 240 hour(s))  MRSA PCR Screening     Status: None   Collection Time: 12/14/16  8:25 AM  Result Value Ref Range Status   MRSA by PCR NEGATIVE NEGATIVE Final    Comment:        The GeneXpert MRSA Assay (FDA approved for NASAL specimens only), is one component of a comprehensive MRSA colonization surveillance program. It is not intended to diagnose MRSA infection nor to guide or monitor treatment for MRSA infections.          Radiology Studies: No results found.      Scheduled Meds: . chlorhexidine  15 mL Mouth Rinse BID  . collagenase   Topical Daily  . feeding supplement (ENSURE ENLIVE)  237 mL Oral TID BM  . fluticasone furoate-vilanterol  1 puff Inhalation Daily  . insulin aspart  0-9 Units Subcutaneous Q4H  . mouth rinse  15 mL Mouth Rinse q12n4p  . metoprolol tartrate  25 mg Oral BID  . multivitamin with minerals  1 tablet Oral Daily  . nicotine  21 mg Transdermal Daily  . saccharomyces boulardii  250 mg Oral BID  . sodium chloride flush  10-40 mL Intracatheter Q12H  . sucralfate  1 g Oral TID WC & HS  . vancomycin  125 mg Oral Q6H   Continuous Infusions: . sodium chloride Stopped (12/22/16 2204)  . sodium chloride 10 mL/hr at 12/19/16 1210  . pantoprozole (PROTONIX) infusion Stopped (12/22/16 2204)     LOS: 13 days      Jaiden Wahab Gerome Apley, MD [facility] Pager [phone]  If 7PM-7AM, please contact night-coverage [email or web address] Password [email or web address] 12/23/2016, 9:07 AM

## 2016-12-23 NOTE — Progress Notes (Signed)
  Subjective:  Patient has no complaints. He states his appetite is good he denies nausea vomiting dysphagia chest or abdominal pain. He is anxious to return to [facility].  Objective: Blood pressure (!) 167/73, pulse 96, temperature 97.7 F (36.5 C), temperature source Oral, resp. rate 20, height 6' (1.829 m), weight 259 lb 14.8 oz (117.9 kg), SpO2 100 %. Patient is alert and in no acute distress. Abdomen is full but soft and nontender without organomegaly or masses. No LE edema or clubbing noted.  He has dressing over both feet and ankles. He has trace edema around right ankle with erythema to skin.  Labs/studies Results:   Recent Labs  12/21/16 0727 12/21/16 1651 12/22/16 0600 12/23/16 0651  WBC 11.8*  --  14.1* 11.2*  HGB 7.6* 8.5* 9.0* 9.0*  HCT 22.9* 25.5* 26.2* 27.0*  PLT 217  --  257 260    BMET   Recent Labs  12/21/16 0727 12/22/16 0600 12/23/16 0651  NA 142 145 143  K 4.5 4.3 4.1  CL 106 108 106  CO2 26 24 26   GLUCOSE 183* 183* 190*  BUN 99* 91* 83*  CREATININE 5.07* 5.06* 4.91*  CALCIUM 8.2* 8.6* 8.6*    LFT   Recent Labs  12/21/16 1651 12/22/16 0600 12/23/16 0651  PROT 5.6*  --   --   ALBUMIN 2.2* 2.5* 2.5*  AST 19  --   --   ALT 36  --   --   ALKPHOS 62  --   --   BILITOT 0.6  --   --   BILIDIR <0.1*  --   --   IBILI NOT CALCULATED  --   --      Assessment:  #1. UGI bleed secondary to multiple bulbar ulcers. He is tolerating diabetic/renal diet.  #2. Anemia. Acute on chronic anemia secondary to GI blood loss. Patient has received 2 units of PRBCs. H&H remained low but stable.  #3.CKD. Patient followed by Dr. Hinda Lenis.   Recommendations:  Change pantoprazole to oral route at 40 mg by mouth twice a day. CBC in a.m.

## 2016-12-24 ENCOUNTER — Encounter: Payer: Self-pay | Admitting: Nurse Practitioner

## 2016-12-24 ENCOUNTER — Encounter (HOSPITAL_COMMUNITY): Payer: Self-pay | Admitting: Internal Medicine

## 2016-12-24 DIAGNOSIS — Z8673 Personal history of transient ischemic attack (TIA), and cerebral infarction without residual deficits: Secondary | ICD-10-CM | POA: Diagnosis not present

## 2016-12-24 DIAGNOSIS — Z9181 History of falling: Secondary | ICD-10-CM | POA: Diagnosis not present

## 2016-12-24 DIAGNOSIS — I5032 Chronic diastolic (congestive) heart failure: Secondary | ICD-10-CM | POA: Diagnosis not present

## 2016-12-24 DIAGNOSIS — M8600 Acute hematogenous osteomyelitis, unspecified site: Secondary | ICD-10-CM

## 2016-12-24 DIAGNOSIS — M6281 Muscle weakness (generalized): Secondary | ICD-10-CM | POA: Diagnosis not present

## 2016-12-24 LAB — RENAL FUNCTION PANEL
ANION GAP: 10 (ref 5–15)
Albumin: 2.6 g/dL — ABNORMAL LOW (ref 3.5–5.0)
BUN: 77 mg/dL — AB (ref 6–20)
CO2: 28 mmol/L (ref 22–32)
Calcium: 8.6 mg/dL — ABNORMAL LOW (ref 8.9–10.3)
Chloride: 104 mmol/L (ref 101–111)
Creatinine, Ser: 4.75 mg/dL — ABNORMAL HIGH (ref 0.61–1.24)
GFR calc Af Amer: 13 mL/min — ABNORMAL LOW (ref >60)
GFR calc non Af Amer: 12 mL/min — ABNORMAL LOW (ref >60)
GLUCOSE: 209 mg/dL — AB (ref 65–99)
POTASSIUM: 4.1 mmol/L (ref 3.5–5.1)
Phosphorus: 4.6 mg/dL (ref 2.5–4.6)
SODIUM: 142 mmol/L (ref 135–145)

## 2016-12-24 LAB — GLUCOSE, CAPILLARY
GLUCOSE-CAPILLARY: 164 mg/dL — AB (ref 65–99)
GLUCOSE-CAPILLARY: 233 mg/dL — AB (ref 65–99)
Glucose-Capillary: 146 mg/dL — ABNORMAL HIGH (ref 65–99)
Glucose-Capillary: 171 mg/dL — ABNORMAL HIGH (ref 65–99)

## 2016-12-24 LAB — CBC
HCT: 26.4 % — ABNORMAL LOW (ref 39.0–52.0)
HEMOGLOBIN: 8.8 g/dL — AB (ref 13.0–17.0)
MCH: 32.5 pg (ref 26.0–34.0)
MCHC: 33.3 g/dL (ref 30.0–36.0)
MCV: 97.4 fL (ref 78.0–100.0)
Platelets: 303 10*3/uL (ref 150–400)
RBC: 2.71 MIL/uL — ABNORMAL LOW (ref 4.22–5.81)
RDW: 16.8 % — AB (ref 11.5–15.5)
WBC: 11.6 10*3/uL — ABNORMAL HIGH (ref 4.0–10.5)

## 2016-12-24 MED ORDER — SUCRALFATE 1 GM/10ML PO SUSP: 1.0000 g | Freq: Three times a day (TID) | ORAL | 0 refills | Status: DC

## 2016-12-24 MED ORDER — VANCOMYCIN 50 MG/ML ORAL SOLUTION: 125.0000 mg | Freq: Four times a day (QID) | ORAL | 0 refills | Status: AC

## 2016-12-24 MED ORDER — ENSURE ENLIVE PO LIQD: 237.0000 mL | Freq: Three times a day (TID) | ORAL | 12 refills | Status: DC

## 2016-12-24 MED ORDER — OXYCODONE HCL 5 MG PO TABS: 5.0000 mg | ORAL_TABLET | Freq: Three times a day (TID) | ORAL | 0 refills | Status: DC | PRN

## 2016-12-24 MED ORDER — PANTOPRAZOLE SODIUM 40 MG PO TBEC: 40.0000 mg | DELAYED_RELEASE_TABLET | Freq: Two times a day (BID) | ORAL | 0 refills | Status: DC

## 2016-12-24 MED ORDER — METOPROLOL TARTRATE 25 MG PO TABS: 25.0000 mg | ORAL_TABLET | Freq: Two times a day (BID) | ORAL | 0 refills | Status: DC

## 2016-12-24 MED ORDER — ADULT MULTIVITAMIN W/MINERALS CH: 1.0000 | ORAL_TABLET | Freq: Every day | ORAL | 0 refills | Status: AC

## 2016-12-24 MED ORDER — ALPRAZOLAM 1 MG PO TABS: 1.0000 mg | ORAL_TABLET | Freq: Three times a day (TID) | ORAL | 0 refills | Status: DC

## 2016-12-24 NOTE — Discharge Summary (Signed)
Physician Discharge Summary  Eric Scott WSF:[id number] DOB: [birth year] DOA: 12/10/2016  PCP: [name], MD  Admit date: 12/10/2016 Discharge date: 12/24/2016  Admitted From: SNF Disposition:  SNF  Recommendations for Outpatient Follow-up:  1. Follow up with PCP in 1- weeks 2. Patient placed on bid pantoprazole and sucralfate for peptic ulcer disease 3. Prohibitive anticoagulation for right upper extremity DVT due to upper GI bleed 4. Continue oral vancomycin for 7 more days 5. Will need close follow up of renal function 6. Patient off antibiotics, will need to follow up with orthopedic surgery Dr. Sharol Given in one week for further osteomyelitis evaluation.  7. Follow up on H. pylori biopsies.  Home Health: No Equipment/Devices: NA   Discharge Condition: Stable  CODE STATUS: Full  Diet recommendation: Heart Healthy / Carb Modified    Brief/Interim Summary: 66 year old male who presented with altered mental status. Patient is known to have anxiety, chronic pain syndrome, osteomyelitis, diabetes mellitus type 2, peripheral vascular disease and prior history of CVA. Patient presented to hospital pain unresponsive. His initial vital signs blood pressure 112/65, heart rate 106, respiratory rate 18, oxygen saturation 96%. Moist mucous membranes, lungs clear to auscultation bilaterally, heart S1-S2 present rhythmic, the abdomen was soft nontender, no lower extremity edema. Sodium 137, potassium 3.8, chloride 110, bicarbonate 12, glucose 231, BUN 87, creatinine 8.37, AST 243, ALT 226, white count 18.0, hemoglobin 12.5, hematocrit 35.9, platelets 308. Urinalysis negative for infection, 100 protein. Head CT negative for acute abnormalities. Chest x-ray had increased lung markings bilaterally with vascular congestion. EKG was sinus rhythm with left axis deviation.  Patient was admitted to the hospital working diagnosis of dilatation due to uremia.  1. Acute kidney injury on chronic kidney disease  stage V. Patient was admitted to the hospital, he underwent emergent hemodialysis. He had a femoral temporary dialysis catheter placed. He had intermittent hemodialysis, his last hemodialysis was July 14. His urine output had improved and serum creatinine stabilized. Need to continue close monitoring of kidney function and electrolytes.  2. Chronic osteomyelitis, bilateral feet. Initially antibiotics were continued including cefepime and daptomycin. It has been recommended patient undergo right foot fifth ray amputation as well as left  transtibial amputation. Patient received about 6 weeks of IV antibiotics. Case was discussed with infectious disease and in the setting of C. difficile and renal failure decision was made to stop antibiotic therapy and have a follow-up with orthopedic surgery, as outpatient.  3. C. difficile diarrhea. Patient developed severe diarrhea, stool tested positive for C. difficile. Patient was placed on oral vancomycin. He will complete therapy in the next 7 days. Clinically his diarrhea has improved.  4. Right upper extremity deep vein thrombosis. Patient had a PICC line on his right upper extremity for antibiotic administration. Ultrasonography was positive for DVT, he was started on heparin drip for anticoagulation. Patient developed acute upper GI bleed, anticoagulation had to be held. Currently prohibitive to continue anticoagulation due to significant upper GI bleed. Will recommend follow-up ultrasonography of the upper extremity within next 3 months.  5. Upper GI bleed with acute blood loss anemia. Patient required 3 units of packed red blood cells to be transfused, he underwent upper endoscopy, showing ulcerative reflux esophagitis, erosive gastropathy and extensive duodenal ulcerations. He was placed on proton pump inhibitors and sucralfate. Biopsies were taken for H. Pylori.  6. Type 2 diabetes mellitus. Kundert-acting insulin was held, patient was continued on insulin  sliding-scale for glucose coverage and monitoring. Capillary glucose remained stable.  7. Hypertension. Patient tolerated well metoprolol.   Discharge Diagnoses:  Principal Problem:   Uremia Active Problems:   DM type 2 (diabetes mellitus, type 2) ([name])   HTN (hypertension)   C. difficile diarrhea   Hypokalemia   COPD with exacerbation ([name])   AKI (acute kidney injury) ([place])   Osteomyelitis ([name])   Volume depletion   Hypotension   Arm DVT (deep venous thromboembolism), acute, right ([place])   Transaminitis   UGI bleed    Discharge Instructions   Allergies as of 12/24/2016      Reactions   Blueberry Flavor    Cucumber Extract Other (See Comments)   "Fells like I'm having a heart attack"   Flexeril [cyclobenzaprine Hcl] Other (See Comments)   "whole body  Tremors" (05/27/2012)   Kiwi Extract Other (See Comments)   "feels like I'm having a heart attack"      Medication List    STOP taking these medications   ceFEPIme 2 g in dextrose 5 % 50 mL   collagenase ointment Commonly known as:  SANTYL   DAPTOmycin 800 mg in sodium chloride 0.9 % 100 mL   LANTUS SOLOSTAR 100 UNIT/ML Solostar Pen Generic drug:  Insulin Glargine   NON FORMULARY   Vitamin D (Ergocalciferol) 50000 units Caps capsule Commonly known as:  DRISDOL     TAKE these medications   albuterol (2.5 MG/3ML) 0.083% nebulizer solution Commonly known as:  PROVENTIL Take 2.5 mg by nebulization every 3 (three) hours as needed for wheezing or shortness of breath.   ALPRAZolam 1 MG tablet Commonly known as:  XANAX Take 1 tablet (1 mg total) by mouth 3 (three) times daily.   BREO ELLIPTA 100-25 MCG/INH Aepb Generic drug:  fluticasone furoate-vilanterol   citalopram 20 MG tablet Commonly known as:  CELEXA Take 20 mg by mouth daily.   feeding supplement (ENSURE ENLIVE) Liqd Take 237 mLs by mouth 3 (three) times daily between meals.   fish oil-omega-3 fatty acids 1000 MG capsule Take 1 g by mouth 2  (two) times daily.   gabapentin 800 MG tablet Commonly known as:  NEURONTIN Take 800 mg by mouth 3 (three) times daily.   HUMALOG KWIKPEN 100 UNIT/ML KiwkPen Generic drug:  insulin lispro Inject 2-14 Units into the skin 4 (four) times daily -  before meals and at bedtime. Per sliding scale 150-200= 2 units 201-250= 4 units 251-300= 6 units 301-349= 8 units 350-400=10units 401-450=12units 451-500=14units ABOVE 501- CALL MD   ipratropium-albuterol 0.5-2.5 (3) MG/3ML Soln Commonly known as:  DUONEB Inhale 3 mLs into the lungs 3 (three) times daily.   l-methylfolate-B6-B12 3-35-2 MG Tabs tablet Commonly known as:  METANX Take 1 tablet by mouth 2 (two) times daily.   METANX 3-90.314-2-35 MG Caps Take 1 capsule by mouth 3 (three) times daily.   metoprolol tartrate 25 MG tablet Commonly known as:  LOPRESSOR Take 1 tablet (25 mg total) by mouth 2 (two) times daily.   multivitamin with minerals Tabs tablet Take 1 tablet by mouth daily.   oxyCODONE 5 MG immediate release tablet Commonly known as:  Oxy IR/ROXICODONE Take 1 tablet (5 mg total) by mouth every 8 (eight) hours as needed for severe pain.   pantoprazole 40 MG tablet Commonly known as:  PROTONIX Take 1 tablet (40 mg total) by mouth 2 (two) times daily before a meal.   saccharomyces boulardii 250 MG capsule Commonly known as:  FLORASTOR Take 1 capsule (250 mg total) by mouth 3 (three) times daily.  sucralfate 1 GM/10ML suspension Commonly known as:  CARAFATE Take 10 mLs (1 g total) by mouth 4 (four) times daily -  with meals and at bedtime.   vancomycin 50 mg/mL oral solution Commonly known as:  VANCOCIN Take 2.5 mLs (125 mg total) by mouth every 6 (six) hours. What changed:  when to take this      Follow-up Information    [name], MD Follow up on 12/27/2016.   Specialty:  Orthopedic Surgery Why:  at 9:15 am Contact information: [place] [address] [phone]         Fran Lowes, MD Follow up in 1 week(s).   Specialty:  Nephrology Contact information: [address] [place] [address] [phone]          Allergies  Allergen Reactions  . Blueberry Flavor   . Cucumber Extract Other (See Comments)    "Fells like I'm having a heart attack"  . Flexeril [Cyclobenzaprine Hcl] Other (See Comments)    "whole body  Tremors" (05/27/2012)  . Kiwi Extract Other (See Comments)    "feels like I'm having a heart attack"    Consultations:  Nephrology  Gastroenterology  Infectious disease   Procedures/Studies: Ct Head Wo Contrast  Result Date: 12/10/2016 CLINICAL DATA:  Fall yesterday.  Lethargic EXAM: CT HEAD WITHOUT CONTRAST TECHNIQUE: Contiguous axial images were obtained from the base of the skull through the vertex without intravenous contrast. COMPARISON:  CT 12/09/2016 FINDINGS: Brain: Mild atrophy unchanged. Negative for hydrocephalus. Chronic ischemic changes in the white matter including the left internal and external capsule. Negative for acute infarct, hemorrhage, or mass lesion. No fluid collection Vascular: Negative for hyperdense vessel Skull: Negative Sinuses/Orbits: Mild mucosal edema right maxillary sinus and left ethmoid sinus. Negative orbit. Other: None IMPRESSION: No acute intracranial abnormality.  Stable since the recent study. Electronically Signed   By: Franchot Gallo M.D.   On: 12/10/2016 13:31   Ct Head Wo Contrast  Result Date: 12/09/2016 CLINICAL DATA:  Pain following fall EXAM: CT HEAD WITHOUT CONTRAST CT CERVICAL SPINE WITHOUT CONTRAST TECHNIQUE: Multidetector CT imaging of the head and cervical spine was performed following the standard protocol without intravenous contrast. Multiplanar CT image reconstructions of the cervical spine were also generated. COMPARISON:  Head CT November 08, 2016 FINDINGS: CT HEAD FINDINGS Brain: Mild diffuse atrophy is stable. There is no intracranial mass, hemorrhage, extra-axial  fluid collection, or midline shift there is patchy small vessel disease in the centra semiovale bilaterally. There is evidence of a prior infarct involving much of the left external capsule, primarily anteriorly. There is a prior infarct in the lateral left thalamus. There is no new gray-white compartment lesion. No acute infarct is evident. Vascular: No hyperdense vessel. There is calcification in each carotid siphon region. Skull: Bony calvarium appears intact. Sinuses/Orbits: There is mucosal thickening with retention cyst in the posterior right maxillary antrum. There is opacification at several sites in left ethmoid region with mucosal thickening in multiple ethmoid air cells bilaterally. Other visualized paranasal sinuses are clear. Orbits appear symmetric bilaterally. Other: Mastoid air cells are clear. CT CERVICAL SPINE FINDINGS Alignment: There is no spondylolisthesis. Skull base and vertebrae: The skull base and craniocervical junction regions appear normal. There is no evident fracture. There is erosive change in the odontoid. There are no blastic or lytic bone lesions. Soft tissues and spinal canal: Prevertebral soft tissues and predental space regions are normal. There are no paraspinous lesions. No cord or canal hematoma  evident. Disc levels: There is moderate disc space narrowing at C6-7. There is facet hypertrophy at several levels. No nerve root edema or effacement. No disc extrusion or stenosis. Upper chest: Visualized upper lung regions appear normal. Other: There is calcification in each proximal subclavian artery as well as in each carotid artery. IMPRESSION: CT head: Atrophy with periventricular small vessel disease. 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 No acute infarct. No intracranial mass, hemorrhage, or extra-axial fluid collection. Foci of arteriovascular calcification noted. Air areas of paranasal sinus disease noted. CT cervical spine: No  fracture or spondylolisthesis. Erosive change in the odontoid without impending fracture evident. Areas of osteoarthritic change. No disc extrusion or stenosis. Multiple foci of vascular calcification including foci of calcification in each carotid artery. Electronically Signed   By: Lowella Grip III M.D.   On: 12/09/2016 18:13   Ct Cervical Spine Wo Contrast  Result Date: 12/09/2016 CLINICAL DATA:  Pain following fall EXAM: CT HEAD WITHOUT CONTRAST CT CERVICAL SPINE WITHOUT CONTRAST TECHNIQUE: Multidetector CT imaging of the head and cervical spine was performed following the standard protocol without intravenous contrast. Multiplanar CT image reconstructions of the cervical spine were also generated. COMPARISON:  Head CT November 08, 2016 FINDINGS: CT HEAD FINDINGS Brain: Mild diffuse atrophy is stable. There is no intracranial mass, hemorrhage, extra-axial fluid collection, or midline shift there is patchy small vessel disease in the centra semiovale bilaterally. There is evidence of a prior infarct involving much of the left external capsule, primarily anteriorly. There is a prior infarct in the lateral left thalamus. There is no new gray-white compartment lesion. No acute infarct is evident. Vascular: No hyperdense vessel. There is calcification in each carotid siphon region. Skull: Bony calvarium appears intact. Sinuses/Orbits: There is mucosal thickening with retention cyst in the posterior right maxillary antrum. There is opacification at several sites in left ethmoid region with mucosal thickening in multiple ethmoid air cells bilaterally. Other visualized paranasal sinuses are clear. Orbits appear symmetric bilaterally. Other: Mastoid air cells are clear. CT CERVICAL SPINE FINDINGS Alignment: There is no spondylolisthesis. Skull base and vertebrae: The skull base and craniocervical junction regions appear normal. There is no evident fracture. There is erosive change in the odontoid. There are no blastic  or lytic bone lesions. Soft tissues and spinal canal: Prevertebral soft tissues and predental space regions are normal. There are no paraspinous lesions. No cord or canal hematoma evident. Disc levels: There is moderate disc space narrowing at C6-7. There is facet hypertrophy at several levels. No nerve root edema or effacement. No disc extrusion or stenosis. Upper chest: Visualized upper lung regions appear normal. Other: There is calcification in each proximal subclavian artery as well as in each carotid artery. IMPRESSION: CT head: Atrophy with periventricular small vessel disease. 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 No acute infarct. No intracranial mass, hemorrhage, or extra-axial fluid collection. Foci of arteriovascular calcification noted. Air areas of paranasal sinus disease noted. CT cervical spine: No fracture or spondylolisthesis. Erosive change in the odontoid without impending fracture evident. Areas of osteoarthritic change. No disc extrusion or stenosis. Multiple foci of vascular calcification including foci of calcification in each carotid artery. Electronically Signed   By: Lowella Grip III M.D.   On: 12/09/2016 18:13   US Abdomen Complete  Result Date: 12/10/2016 CLINICAL DATA:  66 y/o M; increased liver function tests, gallstones, altered mental status. EXAM: ABDOMEN ULTRASOUND COMPLETE COMPARISON:  11/24/2016 abdominal ultrasound. 11/23/2016  CT of the abdomen and pelvis. FINDINGS: Gallbladder: No gallbladder wall thickening or pericholecystic fluid and negative sonographic Murphy's sign. Gallstones measuring up to 2.7 cm. Some additional stones and sludge adherent to the wall of the gallbladder. Common bile duct: Diameter: 6 mm Liver: No focal lesion identified. Mildly increased echogenicity of the liver parenchyma. Normal direction of flow in main portal vein. IVC: No abnormality visualized. Pancreas: Obscured by bowel gas. Spleen: Size and  appearance within normal limits. Right Kidney: Length: 11.2 cm. Increased renal echogenicity. No hydronephrosis or mass identified. Left Kidney: Length: 13.6 cm. Increase renal echogenicity. Simple appearing cyst measuring up to 2.3 cm within the upper pole. Abdominal aorta: Largely obscured by bowel gas. Other findings: None. IMPRESSION: 1. Gallstones and sludge, some adherent to the wall of gallbladder. No secondary signs of acute cholecystitis. 2. Mildly increased liver echogenicity, possible steatosis. 3. Increased renal echogenicity compatible with medical renal disease. Electronically Signed   By: Kristine Garbe M.D.   On: 12/10/2016 14:01   US Venous Img Upper Uni Right  Result Date: 12/18/2016 CLINICAL DATA:  Right upper extremity edema. History of PICC line placement. History of diabetes. Evaluate for DVT. EXAM: RIGHT UPPER EXTREMITY VENOUS DOPPLER ULTRASOUND TECHNIQUE: Gray-scale sonography with graded compression, as well as color Doppler and duplex ultrasound were performed to evaluate the upper extremity deep venous system from the level of the subclavian vein and including the jugular, axillary, basilic, radial, ulnar and upper cephalic vein. Spectral Doppler was utilized to evaluate flow at rest and with distal augmentation maneuvers. COMPARISON:  None. FINDINGS: Contralateral Subclavian Vein: Respiratory phasicity is normal and symmetric with the symptomatic side. No evidence of thrombus. Normal compressibility. Internal Jugular Vein: No evidence of thrombus. Normal compressibility, respiratory phasicity and response to augmentation. There is hypoechoic occlusive thrombus within the right subclavian vein (image 13) extending to involve the axillary vein (image 17), 1 of the paired brachial veins (image 21). The additional paired brachial vein appears patent where imaged (representative image 22). There is hypoechoic occlusive thrombus within the right basilic vein which is noted  containing portion of the hit line (image 24). The radial, ulnar and cephalic veins appear patent where imaged. Other Findings:  None visualized. IMPRESSION: Examination is positive for presumed PICC line associated occlusive DVT and superficial thrombophlebitis involving the PICC line containing basilic vein, one of the paired brachial veins as well as the right axillary and imaged portions of the right subclavian vein. Electronically Signed   By: Sandi Mariscal M.D.   On: 12/18/2016 15:15   Dg Knee Complete 4 Views Left  Result Date: 12/09/2016 CLINICAL DATA:  Fall from bed.  Bilateral knee pain and erythema. EXAM: LEFT KNEE - COMPLETE 4+ VIEW COMPARISON:  01/07/2006 FINDINGS: Articular space narrowing, particularly in the medial compartment. Trace knee effusion.  Minimal marginal spurring along the patella. IMPRESSION: 1. Trace knee effusion. 2. Mild osteoarthritis. Electronically Signed   By: Van Clines M.D.   On: 12/09/2016 17:47   Dg Knee Complete 4 Views Right  Result Date: 12/09/2016 CLINICAL DATA:  Fall from bed, knee pain and erythema. EXAM: RIGHT KNEE - COMPLETE 4+ VIEW COMPARISON:  03/04/2015 FINDINGS: Motion artifact on several images. No fracture or overt knee effusion. Suspected mild prepatellar subcutaneous edema. Medial compartmental articular space narrowing. IMPRESSION: 1. Degenerative chondral thinning in the medial compartment. 2. Prepatellar subcutaneous edema. 3. Mildly reduced sensitivity due to motion artifact. Electronically Signed   By: Van Clines M.D.   On: 12/09/2016 17:50  Dg Abd Acute W/chest  Result Date: 12/10/2016 CLINICAL DATA:  66 year old male with Celsius difficile. Subsequent encounter. EXAM: DG ABDOMEN ACUTE W/ 1V CHEST COMPARISON:  11/23/2016 CT abdomen pelvis and chest x-ray. FINDINGS: Cardiomegaly. Pulmonary vascular prominence. PICC line tip caval distal superior vena cava/ atrial junction level. Calcified aorta with slight tortuosity. Gas-filled  stomach. No plain film evidence of bowel obstruction or free intraperitoneal air. IMPRESSION: No plain film evidence of bowel obstruction or free intraperitoneal air. Cardiomegaly. Pulmonary vascular prominence. Calcified slightly tortuous aorta. Electronically Signed   By: Genia Del M.D.   On: 12/10/2016 11:32   US Abdomen Limited Ruq  Result Date: 11/24/2016 CLINICAL DATA:  66 year old male with cholelithiasis EXAM: ULTRASOUND ABDOMEN LIMITED RIGHT UPPER QUADRANT COMPARISON:  None. FINDINGS: Gallbladder: Echogenic gallstone in the dependent aspects of the gallbladder, located at the neck of the gallbladder measuring at least 3 cm. Posterior shadowing limits evaluation in the far field from the stone. No gallbladder wall thickening. Sonographic Murphy's sign was not recorded. No pericholecystic fluid. Common bile duct: Diameter: 8 mm Liver: Relatively unremarkable appearance of liver parenchyma. IMPRESSION: Cholelithiasis without sonographic evidence of acute cholecystitis Electronically Signed   By: Corrie Mckusick D.O.   On: 11/24/2016 13:48       Subjective: Patient doing better, no nausea or vomiting, no dyspnea or chest pain, diarrhea has improved.   Discharge Exam: Vitals:   12/23/16 2139 12/24/16 0614  BP: (!) 168/91 (!) 146/69  Pulse: 89 76  Resp: 20 18  Temp: 97.9 F (36.6 C) 98 F (36.7 C)   Vitals:   12/23/16 0744 12/23/16 2139 12/24/16 0614 12/24/16 0718  BP:  (!) 168/91 (!) 146/69   Pulse:  89 76   Resp:  20 18   Temp:  97.9 F (36.6 C) 98 F (36.7 C)   TempSrc:  Oral Oral   SpO2: 100% 97% 94% 98%  Weight:   116.3 kg (256 lb 6.3 oz)   Height:        General: Pt is alert, awake, not in acute distress E ENT. Mild pallor, no icterus, oral mucosa moist.  Cardiovascular: RRR, S1/S2 +, no rubs, no gallops Respiratory: CTA bilaterally, no wheezing, no rhonchi Abdominal: Soft, NT, ND, bowel sounds + Extremities: no edema, no cyanosis    The results of significant  diagnostics from this hospitalization (including imaging, microbiology, ancillary and laboratory) are listed below for reference.     Microbiology: No results found for this or any previous visit (from the past 240 hour(s)).   Labs: BNP (last 3 results) No results for input(s): BNP in the last 8760 hours. Basic Metabolic Panel:  Recent Labs Lab 12/18/16 0740 12/19/16 2297 12/20/16 1040 12/21/16 0727 12/22/16 0600 12/23/16 0651  NA 140 140 146* 142 145 143  K 3.2* 3.9 4.3 4.5 4.3 4.1  CL 104 105 108 106 108 106  CO2 25 26 26 26 24 26   GLUCOSE 139* 179* 250* 183* 183* 190*  BUN 50* 66* 98* 99* 91* 83*  CREATININE 4.71* 4.71* 4.96* 5.07* 5.06* 4.91*  CALCIUM 7.6* 7.7* 8.1* 8.2* 8.6* 8.6*  PHOS 3.7 3.3  --  4.4 4.3 4.2   Liver Function Tests:  Recent Labs Lab 12/19/16 0638 12/21/16 0727 12/21/16 1651 12/22/16 0600 12/23/16 0651  AST  --   --  19  --   --   ALT  --   --  36  --   --   ALKPHOS  --   --  62  --   --   BILITOT  --   --  0.6  --   --   PROT  --   --  5.6*  --   --   ALBUMIN 2.0* 2.1* 2.2* 2.5* 2.5*   No results for input(s): LIPASE, AMYLASE in the last 168 hours. No results for input(s): AMMONIA in the last 168 hours. CBC:  Recent Labs Lab 12/19/16 2237 12/20/16 0131 12/20/16 1040 12/21/16 0727 12/21/16 1651 12/22/16 0600 12/23/16 0651  WBC 15.2* 14.2* 13.7* 11.8*  --  14.1* 11.2*  NEUTROABS 11.4* 10.8*  --   --   --   --   --   HGB 7.1* 6.6* 7.5* 7.6* 8.5* 9.0* 9.0*  HCT 21.0* 19.6* 22.2* 22.9* 25.5* 26.2* 27.0*  MCV 97.2 97.5 96.1 95.4  --  94.6 95.7  PLT 251 228 243 217  --  257 260   Cardiac Enzymes: No results for input(s): CKTOTAL, CKMB, CKMBINDEX, TROPONINI in the last 168 hours. BNP: Invalid input(s): POCBNP CBG:  Recent Labs Lab 12/23/16 1621 12/23/16 2024 12/24/16 0036 12/24/16 0410 12/24/16 0728  GLUCAP 150* 148* 171* 146* 164*   D-Dimer No results for input(s): DDIMER in the last 72 hours. Hgb A1c No results for  input(s): HGBA1C in the last 72 hours. Lipid Profile No results for input(s): CHOL, HDL, LDLCALC, TRIG, CHOLHDL, LDLDIRECT in the last 72 hours. Thyroid function studies No results for input(s): TSH, T4TOTAL, T3FREE, THYROIDAB in the last 72 hours.  Invalid input(s): FREET3 Anemia work up No results for input(s): VITAMINB12, FOLATE, FERRITIN, TIBC, IRON, RETICCTPCT in the last 72 hours. Urinalysis    Component Value Date/Time   COLORURINE YELLOW 12/10/2016 1041   APPEARANCEUR HAZY (A) 12/10/2016 1041   LABSPEC 1.016 12/10/2016 1041   PHURINE 5.0 12/10/2016 1041   GLUCOSEU NEGATIVE 12/10/2016 1041   HGBUR LARGE (A) 12/10/2016 1041   BILIRUBINUR NEGATIVE 12/10/2016 1041   KETONESUR 5 (A) 12/10/2016 1041   PROTEINUR 100 (A) 12/10/2016 1041   UROBILINOGEN 0.2 03/04/2015 1835   NITRITE NEGATIVE 12/10/2016 1041   LEUKOCYTESUR TRACE (A) 12/10/2016 1041   Sepsis Labs Invalid input(s): PROCALCITONIN,  WBC,  LACTICIDVEN Microbiology No results found for this or any previous visit (from the past 240 hour(s)).   Time coordinating discharge: 45 minutes  SIGNED:   Tawni Millers, MD  [facility] 12/24/2016, 8:53 AM Pager   If 7PM-7AM, please contact night-coverage [email or web address] Password [email or web address]

## 2016-12-24 NOTE — Progress Notes (Signed)
Patient refused dressing changes on his feet at midnight, tried again at 0650 patient still refusing stated to "please just leave me alone."

## 2016-12-24 NOTE — Progress Notes (Signed)
PT Cancellation Note  Patient Details Name: Eric Scott MRN: [id number] DOB: 10-Jun-1950   Cancelled Treatment:    Reason Eval/Treat Not Completed: Other (comment);PT screened, no needs identified, will sign off (Chart reviewed, RN consulted. Patient actively preparing for DC. No PT note needed to return to SNF per CSW. ) Pt uses WC for primary mobility at baseline x2Y. This morning able to mobilize supine to seated EOB without physical assistance. Pt will have adequate social support upon return to facility. PT signing off.   11:27 AM, 12/24/16 Etta Grandchild, PT, DPT Physical Therapist - [place] [phone] (Office)    Buccola,Allan C 12/24/2016, 11:26 AM

## 2016-12-24 NOTE — Progress Notes (Signed)
Patient discharged to [facility] via [facility] via stretcher in stable condition.  Called and spoke to [place] the nurse whom received the patient she voices that she had no questions at this time.  Voiced to her that I changed the orders per wound care order.  I also discussed with Dr. Cathlean Sauer about the patient having a rash on his bottom requested diflucan tablet.  MD stated due the effects of the kidney he would like to hold off on the med at this time.

## 2016-12-24 NOTE — Care Management Note (Signed)
Case Management Note  Patient Details  Name: TYDEN KANN MRN: [id number] Date of Birth: 03-30-1951    Expected Discharge Date:  12/24/16               Expected Discharge Plan:  [place]  In-House Referral:  Clinical Social Work  Discharge planning Services  CM Consult  Post Acute Care Choice:    Choice offered to:     DME Arranged:    DME Agency:     HH Arranged:    [place] Agency:     Status of Service:  Completed, signed off  If discussed at [employer], dates discussed:    Additional Comments: Patient discharging back to SNF today. CSW aware.   Latrell Reitan, Chauncey Reading, RN 12/24/2016, 10:32 AM

## 2016-12-24 NOTE — Progress Notes (Signed)
Subjective: Interval History: The patient feels good and offers no complaints. He denies any nausea or vomiting.  Objective: Vital signs in last 24 hours: Temp:  [97.9 F (36.6 C)-98 F (36.7 C)] 98 F (36.7 C) (07/23 3825) Pulse Rate:  [76-89] 76 (07/23 0614) Resp:  [18-20] 18 (07/23 0614) BP: (146-168)/(69-91) 146/69 (07/23 0614) SpO2:  [94 %-98 %] 98 % (07/23 0718) Weight:  [116.3 kg (256 lb 6.3 oz)] 116.3 kg (256 lb 6.3 oz) (07/23 0539) Weight change:   Intake/Output from previous day: 07/22 0701 - 07/23 0700 In: 480 [P.O.:480] Out: 1200 [Urine:1200] Intake/Output this shift: No intake/output data recorded.  Patient is alert and in no apparent distress. Chest: Clear to auscultation Heart exam regular rate and rhythm Extremities no edema  Lab Results:  Recent Labs  12/22/16 0600 12/23/16 0651  WBC 14.1* 11.2*  HGB 9.0* 9.0*  HCT 26.2* 27.0*  PLT 257 260   BMET:   Recent Labs  12/22/16 0600 12/23/16 0651  NA 145 143  K 4.3 4.1  CL 108 106  CO2 24 26  GLUCOSE 183* 190*  BUN 91* 83*  CREATININE 5.06* 4.91*  CALCIUM 8.6* 8.6*   No results for input(s): PTH in the last 72 hours. Iron Studies: No results for input(s): IRON, TIBC, TRANSFERRIN, FERRITIN in the last 72 hours.  Studies/Results: No results found.  I have reviewed the patient's current medications.  Assessment/Plan: Problem #1 altered mental status: Patient is alert and oriented. Ask me when he is going to go home. Problem #2 acute kidney injury: Patient is nonoliguric. He had 1200 mL of urine output which is reasonable.  His creatinine is 4.91 from yesterday and seems to be improving. Presently his blood work is pending. Problem #3 low CO2 possibly metabolic: His CO2 has corrected. Problem #4 history of C. difficile colitis. Patient presently denies any more diarrhea. He denies also any abdominal pain. Patient is afebrile and  his white blood cell counts Is also improving. Problem #5  hypertension: His blood pressure is reasonably controlled Problem #6 Bone and mineral disorder: His calcium and phosphorus is in range. Problem #7 history of diabetes: Reasonably controlled Problem #8 history of CVA Problem #9 hypokalemia: His potassium is normal. Problem #10 anemia: Secondary to GI bleeding. His hemoglobin has remained stable. Problem #11 hypernatremia: Sodium is normal. Plan: 1] patient encouraged to increase his fluid intake. 2] we'll check CBC and renal panel from today and also in am 3] Presently patient doesn't need dialysis. He is going to be discharged will follow his renal function and make a decision.   LOS: 14 days   Shamekia Tippets S 12/24/2016,8:19 AM

## 2016-12-24 NOTE — Care Management Important Message (Signed)
Important Message  Patient Details  Name: Eric Scott MRN: [id number] Date of Birth: 1951/04/14   Medicare Important Message Given:  Yes    Lawyer Washabaugh, Chauncey Reading, RN 12/24/2016, 10:33 AM

## 2016-12-24 NOTE — Clinical Social Work Note (Signed)
LCSW notified Debbie at [facility] and left a message for patient's niece advising of discharge.   LCSW sent clinicals via fax function in [place] to [facility].   LCSW signing off.      Alydia Gosser, Clydene Pugh, LCSW

## 2016-12-25 ENCOUNTER — Encounter: Payer: Self-pay | Admitting: Internal Medicine

## 2016-12-27 ENCOUNTER — Ambulatory Visit (INDEPENDENT_AMBULATORY_CARE_PROVIDER_SITE_OTHER): Payer: Medicare Other | Admitting: Family

## 2016-12-27 ENCOUNTER — Encounter (INDEPENDENT_AMBULATORY_CARE_PROVIDER_SITE_OTHER): Payer: Self-pay | Admitting: Orthopedic Surgery

## 2016-12-27 VITALS — Ht 72.0 in | Wt 256.0 lb

## 2016-12-27 DIAGNOSIS — L97919 Non-pressure chronic ulcer of unspecified part of right lower leg with unspecified severity: Secondary | ICD-10-CM

## 2016-12-27 DIAGNOSIS — I87333 Chronic venous hypertension (idiopathic) with ulcer and inflammation of bilateral lower extremity: Secondary | ICD-10-CM

## 2016-12-27 DIAGNOSIS — L89622 Pressure ulcer of left heel, stage 2: Secondary | ICD-10-CM

## 2016-12-27 DIAGNOSIS — L97929 Non-pressure chronic ulcer of unspecified part of left lower leg with unspecified severity: Principal | ICD-10-CM

## 2016-12-27 NOTE — Progress Notes (Addendum)
Office Visit Note   Patient: Eric Scott           Date of Birth: 1950-10-09           MRN: [id number] Visit Date: 12/27/2016              Requested by: [name], MD [place], [place] [address] PCP: [name], MD  Chief Complaint  Patient presents with  . Right Foot - Open Wound    Right lateral 5th metatarsal wound  . Left Foot - Open Wound    Left heel ulceration      HPI: The patient is a 66 year old gentleman who presents today in follow-up for ulcerations to bilateral feet. He was last seen in June was to set up a fifth ray amputation of the right foot at that time. This was never set up. Did subsequently have a hospitalization. The patient has been on IV antibiotics. Today is in for release boots bilaterally to offload the heels. Has pitting edema to his bilateral lower extremities. Is wheelchair bound presents from skilled nursing with N/A.  A below knee amputation on the left has been recommended due to MRI with marrow edema of calcaneus.   Assessment & Plan: Visit Diagnoses:  1. 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 Decubitus ulcer of left heel, stage 2     Plan: stressed the importance of elevating bilateral lower extremities. Offload heels, especially the left. Float left heel. continue with daily wound cleansing. Apply silvadene dressings. Will see him back in 4 weeks.   Will reevaluate from a surgical standpoint in 4 weeks.   Follow-Up Instructions: Return in about 4 weeks (around 01/24/2017).   Ortho Exam  Patient is alert, oriented, no adenopathy, well-dressed, normal affect, normal respiratory effort. On examination the right foot with lateral ulceration to the 5th MT head. This is 15 mm x 1 cm. This is 2 mm deep with 90% granulation tissue, 10% exudative tissue. No surrounding erythema or sign of infection. The left foot with massive heel ulceration. This wraps  around posterior heel, is 9 cm x 3 cm. Is 1 mm deep. Covered with granulation tissue, 95%, 5% exudate. Serosanguinous drainage. No odor, no surrounding erythema. No ascending cellulitis either limb.   Imaging: No results found.  Labs: Lab Results  Component Value Date   HGBA1C 5.7 (H) 07/20/2014   HGBA1C 5.3 05/28/2012   HGBA1C 5.2 05/27/2012   ESRSEDRATE 60 (H) 11/26/2016   CRP 3.2 (H) 11/26/2016   LABURIC 11.5 (H) 08/24/2009   REPTSTATUS 12/15/2016 FINAL 12/10/2016   GRAMSTAIN  02/05/2013    RARE WBC PRESENT, PREDOMINANTLY PMN NO SQUAMOUS EPITHELIAL CELLS SEEN FEW GRAM POSITIVE COCCI IN PAIRS Performed at [facility]   CULT NO GROWTH 5 DAYS 12/10/2016   LABORGA ENTEROCOCCUS FAECALIS (A) 11/08/2016    Orders:  No orders of the defined types were placed in this encounter.  No orders of the defined types were placed in this encounter.    Procedures: No procedures performed  Clinical Data: No additional findings.  ROS:  All other systems negative, except as noted in the HPI. Review of Systems  Constitutional: Negative for chills and fever.  Cardiovascular: Positive for leg swelling.  Skin: Positive for wound. Negative for color change.    Objective: Vital Signs: Ht 6' (1.829 m)   Wt 256 lb (116.1 kg)   BMI 34.72 kg/m  Specialty Comments:  No specialty comments available.  PMFS History: Patient Active Problem List   Diagnosis Date Noted  . Transaminitis   . UGI bleed   . Arm DVT (deep venous thromboembolism), acute, right ([place]) 12/18/2016  . Uremia 12/10/2016  . Acute renal failure ([place])   . Volume depletion 11/23/2016  . Hypotension   . Liver enzyme elevation   . 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 Arterial insufficiency of lower extremity ([place]) 11/15/2016  . Pressure injury of skin 11/09/2016  . Sepsis ([place]) 11/09/2016  . Osteomyelitis ([place]) 11/09/2016  .  Wounds, multiple 11/08/2016  . Chronic venous insufficiency 10/12/2016  . Critical lower limb ischemia 10/12/2016  . Fatty liver 09/10/2016  . History of colonic polyps 09/10/2016  . Gallstone 09/10/2016  . Edema 03/04/2015  . AKI (acute kidney injury) ([place]) 03/04/2015  . Fall 03/04/2015  . Generalized weakness 03/04/2015  . Diabetic polyneuropathy associated with type 2 diabetes mellitus ([place])   . Anxiety   . PVD (peripheral vascular disease) ([place])   . HCAP (healthcare-associated pneumonia) 07/20/2014  . Pneumonia 07/20/2014  . Weight gain 05/02/2014  . Coarse tremors 04/18/2014  . Anxiety state 04/18/2014  . Acute hypoxemic respiratory failure ([place]) 04/07/2014  . Chronic diastolic CHF (congestive heart failure) ([place]) 04/07/2014  . Solitary pulmonary nodule 04/07/2014  . COPD with exacerbation ([place]) 04/02/2014  . Hypoxia 04/02/2014  . CVA (cerebral infarction) 06/01/2012  . Chronic pain (back, legs) 05/30/2012  . Toe laceration, 4th toe 05/30/2012  . C. difficile diarrhea 05/30/2012  . Hypokalemia 05/30/2012  . Acute lacunar stroke ([place]) 05/27/2012  . DM type 2 (diabetes mellitus, type 2) ([place]) 05/27/2012  . Smoker 05/27/2012  . HTN (hypertension) 05/27/2012   Past Medical History:  Diagnosis Date  . Anxiety   . Chronic pain    legs, back; MRI 05/2012 with mild thoracic degenerative changes no spinal stenosis   . COPD (chronic obstructive pulmonary disease) ([place])   . Depression   . Diabetic peripheral neuropathy ([place])    "chronic" (05/27/2012)  . Diastolic CHF ([place])   . Hypercholesteremia   . Hypertension   . Peripheral edema   . PVD (peripheral vascular disease) ([place])   . Spinal stenosis    mild lumbar (MRI 05/2012)-L2-L3 to L4-L5 , mild lumbar foraminal stenosis   . Stroke ([place]) 05/2012   Subacute, lacunar infarcts within the left basal ganglia and posterior limp of the left internal capsule/thalamus; "RUE; both feet weak" (05/27/2012)  . Tremor   . Type II  diabetes mellitus ([name])     Family History  Problem Relation Age of Onset  . Colon cancer Neg Hx     Past Surgical History:  Procedure Laterality Date  . ANKLE SURGERY    . ESOPHAGOGASTRODUODENOSCOPY (EGD) WITH PROPOFOL N/A 12/21/2016   Procedure: ESOPHAGOGASTRODUODENOSCOPY (EGD) WITH PROPOFOL;  Surgeon: Daneil Dolin, MD;  Location[facility];  Service: Gastroenterology;  Laterality: N/A;  . HERNIA REPAIR  01/05/2004   "belly button" (05/27/2012)   Social History   Occupational History  . Not on file.   Social History Main Topics  . Smoking status: Former Smoker    Packs/day: 0.50    Years: 45.00    Types: Cigarettes    Quit date: 12/28/2014  . Smokeless tobacco: Never Used  . Alcohol use No     Comment: 05/27/2012 "drank gallons and gallons 20 yr ago or so; last drink  at  least 10 yr ago"  . Drug use: No  . Sexual activity: No

## 2017-01-21 ENCOUNTER — Ambulatory Visit (INDEPENDENT_AMBULATORY_CARE_PROVIDER_SITE_OTHER): Payer: Medicare Other | Admitting: Orthopedic Surgery

## 2017-01-21 ENCOUNTER — Encounter (INDEPENDENT_AMBULATORY_CARE_PROVIDER_SITE_OTHER): Payer: Self-pay | Admitting: Orthopedic Surgery

## 2017-01-21 VITALS — Ht 72.0 in | Wt 256.0 lb

## 2017-01-21 DIAGNOSIS — I87333 Chronic venous hypertension (idiopathic) with ulcer and inflammation of bilateral lower extremity: Secondary | ICD-10-CM | POA: Diagnosis not present

## 2017-01-21 DIAGNOSIS — L97919 Non-pressure chronic ulcer of unspecified part of right lower leg with unspecified severity: Secondary | ICD-10-CM

## 2017-01-21 DIAGNOSIS — M86272 Subacute osteomyelitis, left ankle and foot: Secondary | ICD-10-CM | POA: Diagnosis not present

## 2017-01-21 DIAGNOSIS — L89622 Pressure ulcer of left heel, stage 2: Secondary | ICD-10-CM

## 2017-01-21 DIAGNOSIS — L97929 Non-pressure chronic ulcer of unspecified part of left lower leg with unspecified severity: Principal | ICD-10-CM

## 2017-01-21 NOTE — Progress Notes (Signed)
Office Visit Note   Patient: Eric Scott           Date of Birth: 03/27/1951           MRN: [id number] Visit Date: 01/21/2017              Requested by: [name], MD [place], [place] [address] PCP: [name], MD  Chief Complaint  Patient presents with  . Left Foot - Pain  . Right Foot - Pain      HPI: Patient is a 66 year old gentleman who presents in follow-up for both lower extremities. Patient is total assistance with a Hoyer lift for ambulation. Patient is currently using silver alginate dressings at skilled nursing with PR AFOs bilaterally. Patient has been advised to elevate his legs and he states he has not been compliant.  Assessment & Plan: Visit Diagnoses:  1. 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 Decubitus ulcer of left heel, stage 2   3. Subacute osteomyelitis, left ankle and foot ([place])     Plan: Patient is stable he is not interested in surgery his only option would be an above-the-knee amputation. Continue with wound care continue with protection of the ulcers continue with elevation follow-up in 4 weeks  Follow-Up Instructions: Return in about 4 weeks (around 02/18/2017).   Ortho Exam  Patient is alert, oriented, no adenopathy, well-dressed, normal affect, normal respiratory effort. Examination patient is ambulating in a wheelchair. He has a good dorsalis pedis pulse bilaterally he has significant brawny skin color changes in both lower extremities with venous insufficiency ulceration and edema. Examination the lateral fifth metatarsal head ulcer on the right is stable there is no cellulitis no drainage no odor no tenderness to palpation. The ulcer is approximately 10 x 5 mm. Examination the left heel he has hypertrophic granulation tissue and this was touched with silver nitrate there is no purulence there is no depth to the ulcer clinically patient has a low-grade  chronic osteomyelitis of the calcaneus and this is consistent with his MRI scan.  Imaging: No results found. No images are attached to the encounter.  Labs: Lab Results  Component Value Date   HGBA1C 5.7 (H) 07/20/2014   HGBA1C 5.3 05/28/2012   HGBA1C 5.2 05/27/2012   ESRSEDRATE 60 (H) 11/26/2016   CRP 3.2 (H) 11/26/2016   LABURIC 11.5 (H) 08/24/2009   REPTSTATUS 12/15/2016 FINAL 12/10/2016   GRAMSTAIN  02/05/2013    RARE WBC PRESENT, PREDOMINANTLY PMN NO SQUAMOUS EPITHELIAL CELLS SEEN FEW GRAM POSITIVE COCCI IN PAIRS Performed at [facility]   CULT NO GROWTH 5 DAYS 12/10/2016   LABORGA ENTEROCOCCUS FAECALIS (A) 11/08/2016    Orders:  No orders of the defined types were placed in this encounter.  No orders of the defined types were placed in this encounter.    Procedures: No procedures performed  Clinical Data: No additional findings.  ROS:  All other systems negative, except as noted in the HPI. Review of Systems  Objective: Vital Signs: Ht 6' (1.829 m)   Wt 256 lb (116.1 kg)   BMI 34.72 kg/m   Specialty Comments:  No specialty comments available.  PMFS History: Patient Active Problem List   Diagnosis Date Noted  . Transaminitis   . UGI bleed   . Arm DVT (deep venous thromboembolism), acute, right ([place]) 12/18/2016  . Uremia 12/10/2016  . Acute renal failure ([place])   .  Volume depletion 11/23/2016  . Hypotension   . Liver enzyme elevation   . 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 Arterial insufficiency of lower extremity ([place]) 11/15/2016  . Pressure injury of skin 11/09/2016  . Sepsis ([place]) 11/09/2016  . Osteomyelitis ([place]) 11/09/2016  . Wounds, multiple 11/08/2016  . Chronic venous insufficiency 10/12/2016  . Critical lower limb ischemia 10/12/2016  . Fatty liver 09/10/2016  . History of colonic polyps 09/10/2016  . Gallstone 09/10/2016  . Edema 03/04/2015  .  AKI (acute kidney injury) ([place]) 03/04/2015  . Fall 03/04/2015  . Generalized weakness 03/04/2015  . Diabetic polyneuropathy associated with type 2 diabetes mellitus ([place])   . Anxiety   . PVD (peripheral vascular disease) ([place])   . HCAP (healthcare-associated pneumonia) 07/20/2014  . Pneumonia 07/20/2014  . Weight gain 05/02/2014  . Coarse tremors 04/18/2014  . Anxiety state 04/18/2014  . Acute hypoxemic respiratory failure ([place]) 04/07/2014  . Chronic diastolic CHF (congestive heart failure) ([place]) 04/07/2014  . Solitary pulmonary nodule 04/07/2014  . COPD with exacerbation ([place]) 04/02/2014  . Hypoxia 04/02/2014  . CVA (cerebral infarction) 06/01/2012  . Chronic pain (back, legs) 05/30/2012  . Toe laceration, 4th toe 05/30/2012  . C. difficile diarrhea 05/30/2012  . Hypokalemia 05/30/2012  . Acute lacunar stroke ([place]) 05/27/2012  . DM type 2 (diabetes mellitus, type 2) ([place]) 05/27/2012  . Smoker 05/27/2012  . HTN (hypertension) 05/27/2012   Past Medical History:  Diagnosis Date  . Anxiety   . Chronic pain    legs, back; MRI 05/2012 with mild thoracic degenerative changes no spinal stenosis   . COPD (chronic obstructive pulmonary disease) ([place])   . Depression   . Diabetic peripheral neuropathy ([place])    "chronic" (05/27/2012)  . Diastolic CHF ([place])   . Hypercholesteremia   . Hypertension   . Peripheral edema   . PVD (peripheral vascular disease) ([place])   . Spinal stenosis    mild lumbar (MRI 05/2012)-L2-L3 to L4-L5 , mild lumbar foraminal stenosis   . Stroke ([place]) 05/2012   Subacute, lacunar infarcts within the left basal ganglia and posterior limp of the left internal capsule/thalamus; "RUE; both feet weak" (05/27/2012)  . Tremor   . Type II diabetes mellitus ([name])     Family History  Problem Relation Age of Onset  . Colon cancer Neg Hx     Past Surgical History:  Procedure Laterality Date  . ANKLE SURGERY    . ESOPHAGOGASTRODUODENOSCOPY (EGD) WITH PROPOFOL N/A  12/21/2016   Procedure: ESOPHAGOGASTRODUODENOSCOPY (EGD) WITH PROPOFOL;  Surgeon: Daneil Dolin, MD;  [facility];  Service: Gastroenterology;  Laterality: N/A;  . HERNIA REPAIR  01/05/2004   "belly button" (05/27/2012)   Social History   Occupational History  . Not on file.   Social History Main Topics  . Smoking status: Former Smoker    Packs/day: 0.50    Years: 45.00    Types: Cigarettes    Quit date: 12/28/2014  . Smokeless tobacco: Never Used  . Alcohol use No     Comment: 05/27/2012 "drank gallons and gallons 20 yr ago or so; last drink  at least 10 yr ago"  . Drug use: No  . Sexual activity: No

## 2017-02-13 ENCOUNTER — Encounter: Payer: Self-pay | Admitting: Nurse Practitioner

## 2017-02-13 ENCOUNTER — Other Ambulatory Visit: Payer: Self-pay

## 2017-02-13 ENCOUNTER — Ambulatory Visit (INDEPENDENT_AMBULATORY_CARE_PROVIDER_SITE_OTHER): Payer: Medicare Other | Admitting: Nurse Practitioner

## 2017-02-13 VITALS — BP 151/66 | HR 54 | Temp 97.5°F | Resp 0 | Ht 72.0 in

## 2017-02-13 DIAGNOSIS — K259 Gastric ulcer, unspecified as acute or chronic, without hemorrhage or perforation: Secondary | ICD-10-CM | POA: Insufficient documentation

## 2017-02-13 DIAGNOSIS — A0472 Enterocolitis due to Clostridium difficile, not specified as recurrent: Secondary | ICD-10-CM

## 2017-02-13 DIAGNOSIS — K922 Gastrointestinal hemorrhage, unspecified: Secondary | ICD-10-CM

## 2017-02-13 DIAGNOSIS — K253 Acute gastric ulcer without hemorrhage or perforation: Secondary | ICD-10-CM | POA: Diagnosis not present

## 2017-02-13 DIAGNOSIS — K269 Duodenal ulcer, unspecified as acute or chronic, without hemorrhage or perforation: Secondary | ICD-10-CM

## 2017-02-13 NOTE — Assessment & Plan Note (Addendum)
EGD and hospitalization with melena and acute blood loss anemia requiring 2 units of PRBCs demonstrated extensive gastric erosions and erosive reflux esophagitis as well as duodenal ulcers. He was placed on PPI drip for 72 hours and then converted to twice a day PPI. He remains on twice a day PPI and Carafate. Updated hemoglobins was increased from 9.0 discharge to 9.5 on 01/14/2017. He is generally asymptomatic from a GI standpoint. Recommend continue twice a day PPI for at least 3 months, Carafate as needed. We will plan for endoscopic reevaluation at 3 month point to ensure healing. He is no longer on his anticoagulant. Return for follow-up in 4 months.  Proceed with EGD on propofol/MAC with Dr. Oneida Alar in near future: the risks, benefits, and alternatives have been discussed with the patient in detail. The patient states understanding and desires to proceed.  Patient is currently on chronic pain medication and Xanax. No other anticoagulants, anxiolytics, chronic pain medications, or antidepressants. We will plan for the procedure on propofol/MAC to promote adequate sedation as well as allow high liver monitoring during his procedure.

## 2017-02-13 NOTE — Assessment & Plan Note (Signed)
Patient with noted extensive duodenal ulceration. He was placed on a PPI drip for 72 hours and then converted to twice daily PPI and Carafate 4 times a day. He remains on twice a day PPI and Carafate. CBC shows some improvement since discharge. I'll recheck a CBC as per below, plan for endoscopic surveillance of his ulcers and erosions. Return for follow-up in 4 months.

## 2017-02-13 NOTE — Assessment & Plan Note (Signed)
Noted C. difficile diarrhea. Underwent vancomycin treatment and his diarrhea has resolved. No further symptoms. Continue to monitor and consider retesting for any recurrent diarrhea. Return for follow-up in 4 months.

## 2017-02-13 NOTE — Assessment & Plan Note (Signed)
Significant GI bleed with acute blood loss anemia during hospitalization found due to gastric and duodenal ulcers and erosions as well as reflux esophagitis. Patient remains on twice a day PPI and Carafate. CBC last month increased hemoglobin from 9.0-9.5. I'll recheck his CBC again as it is been one month. Return for follow-up in 4 months. Plan to repeat his EGD at 3 month interval for surveillance of healing.

## 2017-02-13 NOTE — Progress Notes (Signed)
CC'ED TO PCP 

## 2017-02-13 NOTE — Patient Instructions (Signed)
1. We will schedule your procedure for you. 2. Have your blood work drawn when you're able to. 3. Return for follow-up in 4 months. 4. Call if any questions or concerns.

## 2017-02-13 NOTE — Progress Notes (Signed)
Referring Provider: [name], MD Primary Care Physician:  [name], MD Primary GI:  Dr. Oneida Alar  Chief Complaint  Patient presents with  . fatty liver    f/u, doing ok  . ulcer    HPI:   Eric Scott is a 66 y.o. male who presents For routine follow-up on fatty liver as well as posthospitalization follow-up. The patient was last seen in our office for 02/21/2017 for fatty liver, C. difficile, gallbladder calculus. Recommended standard fatty liver treatment including diabetic control, cholesterol control, weight loss. Recommended follow-up in one year.  Most recent hepatic function panel in our system indicates normal AST/ALT at 19/36, normal alkaline phosphatase, normal bilirubin.   The patient was referred to the emergency department on 12/10/2016 for altered mental status. Hospital records reviewed. 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 difficile diarrhea, right upper extremity DVT requiring anticoagulation, upper GI bleed with acute blood loss anemia, and others. His initial hemoglobin on admission was 12.5. This continued to decline and ultimately reached a low of 6.6 with noted melena on 12/20/2016. He was subsequently referred for upper endoscopy for further evaluation.  EGD report reviewed. Upper endoscopy completed 12/21/2016 which found ulcerative reflux esophagitis, erosive gastropathy, extensive duodenal ulcerations as likely site of bleeding status post gastric biopsy taken. Recommended PPI drip for 72 hours, clear liquid diet, Carafate 4 times a day. Noted risk of anticoagulation currently prohibitive. Can change acid suppression back to twice a day oral dosing if no further bleeding in the next 48- 72 hours. Ultimately he received a total of 2 units of PRBCs during his admission. His discharge hemoglobin was 9.0  Surgical pathology labs reviewed. Negative for H.  Pylori.  Facility labs reviewed. Last CBC drawn on 01/14/2017 which found a hemoglobin of 9.5.  C. difficile was positive during admission. He was treated with vancomycin. He currently resides at a nursing home.  Today the patient is accompanied by faciluity staff. He states he is not back on blood thinner. Denies abdominal pain, N/V, hematochezia, melena, unintentional weight loss, fever, chills. Diarrhea has resolved. Denies chest pain, dyspnea, dizziness, lightheadedness, syncope, near syncope. Denies any other upper or lower GI symptoms.  Past Medical History:  Diagnosis Date  . Anxiety   . Chronic pain    legs, back; MRI 05/2012 with mild thoracic degenerative changes no spinal stenosis   . COPD (chronic obstructive pulmonary disease) ([place])   . Depression   . Diabetic peripheral neuropathy ([place])    "chronic" (05/27/2012)  . Diastolic CHF ([place])   . Hypercholesteremia   . Hypertension   . Peripheral edema   . PVD (peripheral vascular disease) ([place])   . Spinal stenosis    mild lumbar (MRI 05/2012)-L2-L3 to L4-L5 , mild lumbar foraminal stenosis   . Stroke ([place]) 05/2012   Subacute, lacunar infarcts within the left basal ganglia and posterior limp of the left internal capsule/thalamus; "RUE; both feet weak" (05/27/2012)  . Tremor   . Type II diabetes mellitus ([place])     Past Surgical History:  Procedure Laterality Date  . ANKLE SURGERY    . ESOPHAGOGASTRODUODENOSCOPY (EGD) WITH PROPOFOL N/A 12/21/2016   Procedure: ESOPHAGOGASTRODUODENOSCOPY (EGD) WITH PROPOFOL;  Surgeon: Daneil Dolin, MD;  [facility];  Service: Gastroenterology;  Laterality: N/A;  . HERNIA REPAIR  01/05/2004   "belly button" (05/27/2012)    Current Outpatient Prescriptions  Medication Sig  Dispense Refill  . albuterol (PROVENTIL) (2.5 MG/3ML) 0.083% nebulizer solution Take 2.5 mg by nebulization every 3 (three) hours as needed for wheezing or shortness of breath.    . ALPRAZolam (XANAX) 1 MG  tablet Take 1 tablet (1 mg total) by mouth 3 (three) times daily. 10 tablet 0  . BREO ELLIPTA 100-25 MCG/INH AEPB Inhale 2 puffs into the lungs daily.     . Calcium Carbonate Antacid (TUMS PO) Take 2 tablets by mouth 2 (two) times daily.    . citalopram (CELEXA) 20 MG tablet Take 20 mg by mouth daily.    . fish oil-omega-3 fatty acids 1000 MG capsule Take 1 g by mouth 2 (two) times daily.    Marland Kitchen HUMALOG KWIKPEN 100 UNIT/ML KiwkPen Inject 2-14 Units into the skin 4 (four) times daily -  before meals and at bedtime. Per sliding scale 150-200= 2 units 201-250= 4 units 251-300= 6 units 301-349= 8 units 350-400=10units 401-450=12units 451-500=14units ABOVE 501- CALL MD    . ipratropium-albuterol (DUONEB) 0.5-2.5 (3) MG/3ML SOLN Inhale 3 mLs into the lungs 3 (three) times daily.     . metoprolol tartrate (LOPRESSOR) 25 MG tablet Take 1 tablet (25 mg total) by mouth 2 (two) times daily. 60 tablet 0  . Multiple Vitamin (MULTIVITAMIN) tablet Take 1 tablet by mouth daily.    Marland Kitchen oxyCODONE (OXY IR/ROXICODONE) 5 MG immediate release tablet Take 1 tablet (5 mg total) by mouth every 8 (eight) hours as needed for severe pain. (Patient taking differently: Take 5 mg by mouth 4 (four) times daily. ) 10 tablet 0  . pantoprazole (PROTONIX) 40 MG tablet Take 1 tablet (40 mg total) by mouth 2 (two) times daily before a meal. 60 tablet 0  . sucralfate (CARAFATE) 1 GM/10ML suspension Take 10 mLs (1 g total) by mouth 4 (four) times daily -  with meals and at bedtime. 420 mL 0   No current facility-administered medications for this visit.     Allergies as of 02/13/2017 - Review Complete 02/13/2017  Allergen Reaction Noted  . Blueberry flavor  05/29/2016  . Cucumber extract Other (See Comments) 08/01/2012  . Flexeril [cyclobenzaprine hcl] Other (See Comments) 07/20/2010  . Kiwi extract Other (See Comments) 08/01/2012    Family History  Problem Relation Age of Onset  . Colon cancer Neg Hx     Social History    Social History  . Marital status: Legally Separated    Spouse name: N/A  . Number of children: N/A  . Years of education: N/A   Social History Main Topics  . Smoking status: Former Smoker    Packs/day: 0.50    Years: 45.00    Types: Cigarettes    Quit date: 12/28/2014  . Smokeless tobacco: Never Used  . Alcohol use No     Comment: 05/27/2012 "drank gallons and gallons 20 yr ago or so; last drink  at least 10 yr ago"  . Drug use: No  . Sexual activity: No   Other Topics Concern  . None   Social History Narrative   Patient lives in [place] with a friend.   He is divorced. Has one kid.   Not working for about 7 years now. Used to do plumbing work.    Review of Systems: Complete ROS negative except as per HPI.   Physical Exam: BP (!) 151/66   Pulse (!) 54   Temp (!) 97.5 F (36.4 C) (Oral)   Resp (!) 0   Ht 6' (1.829 m)  General:   Alert and oriented. Pleasant and cooperative. Well-nourished and well-developed. In a wheelchair. Eyes:  Without icterus, sclera clear and conjunctiva pink.  Ears:  Normal auditory acuity. Cardiovascular:  S1, S2 present without murmurs appreciated. Extremities without clubbing or edema. Respiratory:  Clear to auscultation bilaterally. No wheezes, rales, or rhonchi. No distress.  Gastrointestinal:  +BS, soft, non-tender and non-distended. No HSM noted. No guarding or rebound. No masses appreciated.  Rectal:  Deferred  Musculoskalatal:  Symmetrical without gross deformities. Neurologic:  Alert and oriented x4;  grossly normal neurologically. Psych:  Alert and cooperative. Normal mood and affect. Heme/Lymph/Immune: No excessive bruising noted.    02/13/2017 9:44 AM   Disclaimer: This note was dictated with voice recognition software. Similar sounding words can inadvertently be transcribed and may not be corrected upon review.

## 2017-02-18 ENCOUNTER — Ambulatory Visit (INDEPENDENT_AMBULATORY_CARE_PROVIDER_SITE_OTHER): Payer: Medicare Other | Admitting: Orthopedic Surgery

## 2017-02-18 ENCOUNTER — Encounter (INDEPENDENT_AMBULATORY_CARE_PROVIDER_SITE_OTHER): Payer: Self-pay | Admitting: Orthopedic Surgery

## 2017-02-18 DIAGNOSIS — F17200 Nicotine dependence, unspecified, uncomplicated: Secondary | ICD-10-CM

## 2017-02-18 DIAGNOSIS — I739 Peripheral vascular disease, unspecified: Secondary | ICD-10-CM | POA: Diagnosis not present

## 2017-02-18 DIAGNOSIS — L97929 Non-pressure chronic ulcer of unspecified part of left lower leg with unspecified severity: Principal | ICD-10-CM

## 2017-02-18 DIAGNOSIS — L89622 Pressure ulcer of left heel, stage 2: Secondary | ICD-10-CM

## 2017-02-18 DIAGNOSIS — I87333 Chronic venous hypertension (idiopathic) with ulcer and inflammation of bilateral lower extremity: Secondary | ICD-10-CM

## 2017-02-18 DIAGNOSIS — L97919 Non-pressure chronic ulcer of unspecified part of right lower leg with unspecified severity: Secondary | ICD-10-CM

## 2017-02-18 DIAGNOSIS — M86272 Subacute osteomyelitis, left ankle and foot: Secondary | ICD-10-CM | POA: Diagnosis not present

## 2017-02-18 NOTE — Progress Notes (Signed)
Office Visit Note   Patient: Eric Scott           Date of Birth: June 11, 1950           MRN: [id number] Visit Date: 02/18/2017              Requested by: [name], MD [address] [place], [place] [address] PCP: [name], MD  Chief Complaint  Patient presents with  . Right Leg - Edema  . Right Foot - Wound Check  . Left Foot - Wound Check    Heel ulceration  . Left Leg - Follow-up      HPI: Patient is a 66 year old gentleman smoker diabetic insensate neuropathy who has venous stasis changes of both legs he is currently total assistance for transfers he has had chronic osteomyelitis of the left calcaneus. He is currently wearing PRAFO boots bilaterally with dressing changes to both lower extremities he is currently at skilled nursing.  Assessment & Plan: Visit Diagnoses:  1. 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 Subacute osteomyelitis, left ankle and foot ([place])   3. Decubitus ulcer of left heel, stage 2   4. Arterial insufficiency of lower extremity ([name])   5. Smoker     Plan: Recommended proceeding with a below the knee amputation the left. Patient is developing increasing strength and the left lower extremity and he could potentially ambulate or transfer. Discussed that it is [place] not to proceed with surgery with the osteomyelitis of the calcaneus discussed risks of systemic infection. Patient states he understands he will not consent to surgery at this time follow-up in 4 weeks.  Follow-Up Instructions: Return in about 4 weeks (around 03/18/2017).   Ortho Exam  Patient is alert, oriented, no adenopathy, well-dressed, normal affect, normal respiratory effort. Examination patient does have active plantarflexion and dorsiflexion of the left ankle and toes. He can flex his hip on the left. He does have a palpable dorsalis pedis pulse. He has pitting edema with brawny skin color  changes left leg but no open venous ulcers. He has a large ulcer over the medial calcaneus which is 4 cm in diameter and 1 mm deep. There is clear drainage. The ulcer does not probe to bone. Examination the right foot he has a stable ulcer over the fifth metatarsal head there is no cellulitis no drainage no signs of infection.  Imaging: No results found. No images are attached to the encounter.  Labs: Lab Results  Component Value Date   HGBA1C 5.7 (H) 07/20/2014   HGBA1C 5.3 05/28/2012   HGBA1C 5.2 05/27/2012   ESRSEDRATE 60 (H) 11/26/2016   CRP 3.2 (H) 11/26/2016   LABURIC 11.5 (H) 08/24/2009   REPTSTATUS 12/15/2016 FINAL 12/10/2016   GRAMSTAIN  02/05/2013    RARE WBC PRESENT, PREDOMINANTLY PMN NO SQUAMOUS EPITHELIAL CELLS SEEN FEW GRAM POSITIVE COCCI IN PAIRS Performed at [facility]   CULT NO GROWTH 5 DAYS 12/10/2016   LABORGA ENTEROCOCCUS FAECALIS (A) 11/08/2016    Orders:  No orders of the defined types were placed in this encounter.  No orders of the defined types were placed in this encounter.    Procedures: No procedures performed  Clinical Data: No additional findings.  ROS:  All other systems negative, except as noted in the HPI. Review of Systems  Objective: Vital Signs: There were no vitals taken for this visit.  Specialty Comments:  No specialty comments  available.  PMFS History: Patient Active Problem List   Diagnosis Date Noted  . Gastric erosions 02/13/2017  . Duodenal ulcer 02/13/2017  . Transaminitis   . UGI bleed   . Arm DVT (deep venous thromboembolism), acute, right ([place]) 12/18/2016  . Uremia 12/10/2016  . Acute renal failure ([place])   . Volume depletion 11/23/2016  . Hypotension   . Liver enzyme elevation   . 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 Arterial insufficiency of lower extremity ([place]) 11/15/2016  . Pressure injury of skin 11/09/2016  .  Sepsis ([place]) 11/09/2016  . Osteomyelitis ([place]) 11/09/2016  . Wounds, multiple 11/08/2016  . Chronic venous insufficiency 10/12/2016  . Critical lower limb ischemia 10/12/2016  . Fatty liver 09/10/2016  . History of colonic polyps 09/10/2016  . Gallstone 09/10/2016  . Edema 03/04/2015  . AKI (acute kidney injury) ([place]) 03/04/2015  . Fall 03/04/2015  . Generalized weakness 03/04/2015  . Diabetic polyneuropathy associated with type 2 diabetes mellitus ([place])   . Anxiety   . PVD (peripheral vascular disease) ([place])   . HCAP (healthcare-associated pneumonia) 07/20/2014  . Pneumonia 07/20/2014  . Weight gain 05/02/2014  . Coarse tremors 04/18/2014  . Anxiety state 04/18/2014  . Acute hypoxemic respiratory failure ([place]) 04/07/2014  . Chronic diastolic CHF (congestive heart failure) ([place]) 04/07/2014  . Solitary pulmonary nodule 04/07/2014  . COPD with exacerbation ([place]) 04/02/2014  . Hypoxia 04/02/2014  . CVA (cerebral infarction) 06/01/2012  . Chronic pain (back, legs) 05/30/2012  . Toe laceration, 4th toe 05/30/2012  . C. difficile diarrhea 05/30/2012  . Hypokalemia 05/30/2012  . Acute lacunar stroke ([place]) 05/27/2012  . DM type 2 (diabetes mellitus, type 2) ([place]) 05/27/2012  . Smoker 05/27/2012  . HTN (hypertension) 05/27/2012   Past Medical History:  Diagnosis Date  . Anxiety   . Chronic pain    legs, back; MRI 05/2012 with mild thoracic degenerative changes no spinal stenosis   . COPD (chronic obstructive pulmonary disease) ([place])   . Depression   . Diabetic peripheral neuropathy ([place])    "chronic" (05/27/2012)  . Diastolic CHF ([place])   . Hypercholesteremia   . Hypertension   . Peripheral edema   . PVD (peripheral vascular disease) ([place])   . Spinal stenosis    mild lumbar (MRI 05/2012)-L2-L3 to L4-L5 , mild lumbar foraminal stenosis   . Stroke ([place]) 05/2012   Subacute, lacunar infarcts within the left basal ganglia and posterior limp of the left internal capsule/thalamus;  "RUE; both feet weak" (05/27/2012)  . Tremor   . Type II diabetes mellitus ([name])     Family History  Problem Relation Age of Onset  . Colon cancer Neg Hx     Past Surgical History:  Procedure Laterality Date  . ANKLE SURGERY    . ESOPHAGOGASTRODUODENOSCOPY (EGD) WITH PROPOFOL N/A 12/21/2016   Procedure: ESOPHAGOGASTRODUODENOSCOPY (EGD) WITH PROPOFOL;  Surgeon: Daneil Dolin, MD;  Location[facility];  Service: Gastroenterology;  Laterality: N/A;  . HERNIA REPAIR  01/05/2004   "belly button" (05/27/2012)   Social History   Occupational History  . Not on file.   Social History Main Topics  . Smoking status: Former Smoker    Packs/day: 0.50    Years: 45.00    Types: Cigarettes    Quit date: 12/28/2014  . Smokeless tobacco: Never Used  . Alcohol use No     Comment: 05/27/2012 "drank gallons and gallons 20 yr ago or so;  last drink  at least 10 yr ago"  . Drug use: No  . Sexual activity: No

## 2017-03-18 ENCOUNTER — Encounter (INDEPENDENT_AMBULATORY_CARE_PROVIDER_SITE_OTHER): Payer: Self-pay | Admitting: Orthopedic Surgery

## 2017-03-18 ENCOUNTER — Ambulatory Visit (INDEPENDENT_AMBULATORY_CARE_PROVIDER_SITE_OTHER): Payer: Medicare Other | Admitting: Orthopedic Surgery

## 2017-03-18 DIAGNOSIS — I87333 Chronic venous hypertension (idiopathic) with ulcer and inflammation of bilateral lower extremity: Secondary | ICD-10-CM

## 2017-03-18 DIAGNOSIS — M86272 Subacute osteomyelitis, left ankle and foot: Secondary | ICD-10-CM | POA: Diagnosis not present

## 2017-03-18 DIAGNOSIS — L97929 Non-pressure chronic ulcer of unspecified part of left lower leg with unspecified severity: Secondary | ICD-10-CM | POA: Diagnosis not present

## 2017-03-18 DIAGNOSIS — L97919 Non-pressure chronic ulcer of unspecified part of right lower leg with unspecified severity: Secondary | ICD-10-CM | POA: Diagnosis not present

## 2017-03-18 NOTE — Progress Notes (Signed)
Office Visit Note   Patient: Eric Scott           Date of Birth: 1950/06/21           MRN: [id number] Visit Date: 03/18/2017              Requested by: [name], [place], [place] [address] PCP: [name], MD  Chief Complaint  Patient presents with  . Follow-up    bilateral lower extremities      HPI: Patient is a 66 year old gentleman smoker diabetic insensate neuropathy who has venous stasis changes of both legs he is currently total assistance for transfers he has had chronic osteomyelitis of the left calcaneus. He is currently wearing PRAFO boots bilaterally with dressing changes to both lower extremities he is currently at skilled nursing.  Assessment & Plan: Visit Diagnoses:  1. 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 Subacute osteomyelitis, left ankle and foot ([name])     Plan: continue with the [facility] boots bilaterally 24 hours a continue dressing changes to the left tibia and left heel. Patient does not want to consider surgical intervention for the osteomyelitis of the calcaneus.  Follow-Up Instructions: Return in about 4 weeks (around 04/15/2017).   Ortho Exam  Patient is alert, oriented, no adenopathy, well-dressed, normal affect, normal respiratory effort. Examination patient is angling in a wheelchair. He does have a tremor of the upper extremities. Examination the left leg he has good granulation tissue over the tibial crest abrasion ulcer. There is no cellulitis no drainage no odor no signs of infection. Examination the left heel the ulcer has beefy hypertrophic granulation tissue this was touched with silver nitrate the ulcer is 2 cm in diameter proximally 2 mm proud. There is no cellulitis no odor no drainage. Right leg is stable without ulcer or cellulitis. There is brawny skin color changes in both legs.  Imaging: No results found. No images are attached to the  encounter.  Labs: Lab Results  Component Value Date   HGBA1C 5.7 (H) 07/20/2014   HGBA1C 5.3 05/28/2012   HGBA1C 5.2 05/27/2012   ESRSEDRATE 60 (H) 11/26/2016   CRP 3.2 (H) 11/26/2016   LABURIC 11.5 (H) 08/24/2009   REPTSTATUS 12/15/2016 FINAL 12/10/2016   GRAMSTAIN  02/05/2013    RARE WBC PRESENT, PREDOMINANTLY PMN NO SQUAMOUS EPITHELIAL CELLS SEEN FEW GRAM POSITIVE COCCI IN PAIRS Performed at [facility]   CULT NO GROWTH 5 DAYS 12/10/2016   LABORGA ENTEROCOCCUS FAECALIS (A) 11/08/2016    Orders:  No orders of the defined types were placed in this encounter.  No orders of the defined types were placed in this encounter.    Procedures: No procedures performed  Clinical Data: No additional findings.  ROS:  All other systems negative, except as noted in the HPI. Review of Systems  Objective: Vital Signs: There were no vitals taken for this visit.  Specialty Comments:  No specialty comments available.  PMFS History: Patient Active Problem List   Diagnosis Date Noted  . Gastric erosions 02/13/2017  . Duodenal ulcer 02/13/2017  . Transaminitis   . UGI bleed   . Arm DVT (deep venous thromboembolism), acute, right ([place]) 12/18/2016  . Uremia 12/10/2016  . Acute renal failure ([place])   . Volume depletion 11/23/2016  . Hypotension   . Liver enzyme elevation   . Idiopathic chronic venous hypertension of both lower extremities with ulcer and  inflammation ([place]) 11/15/2016  . Arterial insufficiency of lower extremity ([place]) 11/15/2016  . Pressure injury of skin 11/09/2016  . Sepsis ([place]) 11/09/2016  . Osteomyelitis ([place]) 11/09/2016  . Wounds, multiple 11/08/2016  . Chronic venous insufficiency 10/12/2016  . Critical lower limb ischemia 10/12/2016  . Fatty liver 09/10/2016  . History of colonic polyps 09/10/2016  . Gallstone 09/10/2016  . Edema 03/04/2015  . AKI (acute kidney injury) ([place]) 03/04/2015  . Fall 03/04/2015  . Generalized weakness 03/04/2015    . Diabetic polyneuropathy associated with type 2 diabetes mellitus ([place])   . Anxiety   . PVD (peripheral vascular disease) ([place])   . HCAP (healthcare-associated pneumonia) 07/20/2014  . Pneumonia 07/20/2014  . Weight gain 05/02/2014  . Coarse tremors 04/18/2014  . Anxiety state 04/18/2014  . Acute hypoxemic respiratory failure ([place]) 04/07/2014  . Chronic diastolic CHF (congestive heart failure) ([place]) 04/07/2014  . Solitary pulmonary nodule 04/07/2014  . COPD with exacerbation ([place]) 04/02/2014  . Hypoxia 04/02/2014  . CVA (cerebral infarction) 06/01/2012  . Chronic pain (back, legs) 05/30/2012  . Toe laceration, 4th toe 05/30/2012  . C. difficile diarrhea 05/30/2012  . Hypokalemia 05/30/2012  . Acute lacunar stroke ([place]) 05/27/2012  . DM type 2 (diabetes mellitus, type 2) ([place]) 05/27/2012  . Smoker 05/27/2012  . HTN (hypertension) 05/27/2012   Past Medical History:  Diagnosis Date  . Anxiety   . Chronic pain    legs, back; MRI 05/2012 with mild thoracic degenerative changes no spinal stenosis   . COPD (chronic obstructive pulmonary disease) ([place])   . Depression   . Diabetic peripheral neuropathy ([place])    "chronic" (05/27/2012)  . Diastolic CHF ([place])   . Hypercholesteremia   . Hypertension   . Peripheral edema   . PVD (peripheral vascular disease) ([place])   . Spinal stenosis    mild lumbar (MRI 05/2012)-L2-L3 to L4-L5 , mild lumbar foraminal stenosis   . Stroke ([place]) 05/2012   Subacute, lacunar infarcts within the left basal ganglia and posterior limp of the left internal capsule/thalamus; "RUE; both feet weak" (05/27/2012)  . Tremor   . Type II diabetes mellitus ([name])     Family History  Problem Relation Age of Onset  . Colon cancer Neg Hx     Past Surgical History:  Procedure Laterality Date  . ANKLE SURGERY    . ESOPHAGOGASTRODUODENOSCOPY (EGD) WITH PROPOFOL N/A 12/21/2016   Procedure: ESOPHAGOGASTRODUODENOSCOPY (EGD) WITH PROPOFOL;  Surgeon: Daneil Dolin, MD;   [facility];  Service: Gastroenterology;  Laterality: N/A;  . HERNIA REPAIR  01/05/2004   "belly button" (05/27/2012)   Social History   Occupational History  . Not on file.   Social History Main Topics  . Smoking status: Former Smoker    Packs/day: 0.50    Years: 45.00    Types: Cigarettes    Quit date: 12/28/2014  . Smokeless tobacco: Never Used  . Alcohol use No     Comment: 05/27/2012 "drank gallons and gallons 20 yr ago or so; last drink  at least 10 yr ago"  . Drug use: No  . Sexual activity: No

## 2017-03-25 NOTE — Patient Instructions (Signed)
Eric Scott  03/25/2017     @PREFPERIOPPHARMACY @   Your procedure is scheduled on  04/02/2017 .  Report to [facility] at  845  A.M.  Call this number if you have problems the morning of surgery:  [phone]   Remember:  Do not eat food or drink liquids after midnight.  Take these medicines the morning of surgery with A SIP OF WATER  Xanax, celexa, metoprolol, oxycodone, protonix. Use your nebulizer and your inhaler before you come. If CBG is greater than 22o am of procedure, take 1/2 of usual correction dosage. Nothing else for diabates that morning.   Do not wear jewelry, make-up or nail polish.  Do not wear lotions, powders, or perfumes, or deoderant.  Do not shave 48 hours prior to surgery.  Men may shave face and neck.  Do not bring valuables to the hospital.  [facility] is not responsible for any belongings or valuables.  Contacts, dentures or bridgework may not be worn into surgery.  Leave your suitcase in the car.  After surgery it may be brought to your room.  For patients admitted to the hospital, discharge time will be determined by your treatment team.  Patients discharged the day of surgery will not be allowed to drive home.   Name and phone number of your driver:   family Special instructions:  Follow the diet instructions given to you by Dr Nona Dell office.  Please read over the following fact sheets that you were given. Anesthesia Post-op Instructions and Care and Recovery After Surgery       Esophagogastroduodenoscopy Esophagogastroduodenoscopy (EGD) is a procedure to examine the lining of the esophagus, stomach, and first part of the small intestine (duodenum). This procedure is done to check for problems such as inflammation, bleeding, ulcers, or growths. During this procedure, a Helzer, flexible, lighted tube with a camera attached (endoscope) is inserted down the throat. Tell a health care provider about:  Any allergies you  have.  All medicines you are taking, including vitamins, herbs, eye drops, creams, and over-the-counter medicines.  Any problems you or family members have had with anesthetic medicines.  Any blood disorders you have.  Any surgeries you have had.  Any medical conditions you have.  Whether you are pregnant or may be pregnant. What are the risks? Generally, this is a safe procedure. However, problems may occur, including:  Infection.  Bleeding.  A tear (perforation) in the esophagus, stomach, or duodenum.  Trouble breathing.  Excessive sweating.  Spasms of the larynx.  A slowed heartbeat.  Low blood pressure.  What happens before the procedure?  Follow instructions from your health care provider about eating or drinking restrictions.  Ask your health care provider about: ? Changing or stopping your regular medicines. This is especially important if you are taking diabetes medicines or blood thinners. ? Taking medicines such as aspirin and ibuprofen. These medicines can thin your blood. Do not take these medicines before your procedure if your health care provider instructs you not to.  Plan to have someone take you home after the procedure.  If you wear dentures, be ready to remove them before the procedure. What happens during the procedure?  To reduce your risk of infection, your health care team will wash or sanitize their hands.  An IV tube will be put in a vein in your hand or arm. You will get medicines and fluids through this tube.  You  will be given one or more of the following: ? A medicine to help you relax (sedative). ? A medicine to numb the area (local anesthetic). This medicine may be sprayed into your throat. It will make you feel more comfortable and keep you from gagging or coughing during the procedure. ? A medicine for pain.  A mouth guard may be placed in your mouth to protect your teeth and to keep you from biting on the endoscope.  You will  be asked to lie on your left side.  The endoscope will be lowered down your throat into your esophagus, stomach, and duodenum.  Air will be put into the endoscope. This will help your health care provider see better.  The lining of your esophagus, stomach, and duodenum will be examined.  Your health care provider may: ? Take a tissue sample so it can be looked at in a lab (biopsy). ? Remove growths. ? Remove objects (foreign bodies) that are stuck. ? Treat any bleeding with medicines or other devices that stop tissue from bleeding. ? Widen (dilate) or stretch narrowed areas of your esophagus and stomach.  The endoscope will be taken out. The procedure may vary among health care providers and hospitals. What happens after the procedure?  Your blood pressure, heart rate, breathing rate, and blood oxygen level will be monitored often until the medicines you were given have worn off.  Do not eat or drink anything until the numbing medicine has worn off and your gag reflex has returned. This information is not intended to replace advice given to you by your health care provider. Make sure you discuss any questions you have with your health care provider. Document Released: 09/21/2004 Document Revised: 10/27/2015 Document Reviewed: 04/14/2015 Elsevier Interactive Patient Education  2018 [employer]. Esophagogastroduodenoscopy, Care After Refer to this sheet in the next few weeks. These instructions provide you with information about caring for yourself after your procedure. Your health care provider may also give you more specific instructions. Your treatment has been planned according to current medical practices, but problems sometimes occur. Call your health care provider if you have any problems or questions after your procedure. What can I expect after the procedure? After the procedure, it is common to have:  A sore throat.  Nausea.  Bloating.  Dizziness.  Fatigue.  Follow  these instructions at home:  Do not eat or drink anything until the numbing medicine (local anesthetic) has worn off and your gag reflex has returned. You will know that the local anesthetic has worn off when you can swallow comfortably.  Do not drive for 24 hours if you received a medicine to help you relax (sedative).  If your health care provider took a tissue sample for testing during the procedure, make sure to get your test results. This is your responsibility. Ask your health care provider or the department performing the test when your results will be ready.  Keep all follow-up visits as told by your health care provider. This is important. Contact a health care provider if:  You cannot stop coughing.  You are not urinating.  You are urinating less than usual. Get help right away if:  You have trouble swallowing.  You cannot eat or drink.  You have throat or chest pain that gets worse.  You are dizzy or light-headed.  You faint.  You have nausea or vomiting.  You have chills.  You have a fever.  You have severe abdominal pain.  You have black,  tarry, or bloody stools. This information is not intended to replace advice given to you by your health care provider. Make sure you discuss any questions you have with your health care provider. Document Released: 05/07/2012 Document Revised: 10/27/2015 Document Reviewed: 04/14/2015 Elsevier Interactive Patient Education  2018 [place] Anesthesia is a term that refers to techniques, procedures, and medicines that help a person stay safe and comfortable during a medical procedure. Monitored anesthesia care, or sedation, is one type of anesthesia. Your anesthesia specialist may recommend sedation if you will be having a procedure that does not require you to be unconscious, such as:  Cataract surgery.  A dental procedure.  A biopsy.  A colonoscopy.  During the procedure, you may receive  a medicine to help you relax (sedative). There are three levels of sedation:  Mild sedation. At this level, you may feel awake and relaxed. You will be able to follow directions.  Moderate sedation. At this level, you will be sleepy. You may not remember the procedure.  Deep sedation. At this level, you will be asleep. You will not remember the procedure.  The more medicine you are given, the deeper your level of sedation will be. Depending on how you respond to the procedure, the anesthesia specialist may change your level of sedation or the type of anesthesia to fit your needs. An anesthesia specialist will monitor you closely during the procedure. Let your health care provider know about:  Any allergies you have.  All medicines you are taking, including vitamins, herbs, eye drops, creams, and over-the-counter medicines.  Any use of steroids (by mouth or as a cream).  Any problems you or family members have had with sedatives and anesthetic medicines.  Any blood disorders you have.  Any surgeries you have had.  Any medical conditions you have, such as sleep apnea.  Whether you are pregnant or may be pregnant.  Any use of cigarettes, alcohol, or street drugs. What are the risks? Generally, this is a safe procedure. However, problems may occur, including:  Getting too much medicine (oversedation).  Nausea.  Allergic reaction to medicines.  Trouble breathing. If this happens, a breathing tube may be used to help with breathing. It will be removed when you are awake and breathing on your own.  Heart trouble.  Lung trouble.  Before the procedure Staying hydrated Follow instructions from your health care provider about hydration, which may include:  Up to 2 hours before the procedure - you may continue to drink clear liquids, such as water, clear fruit juice, black coffee, and plain tea.  Eating and drinking restrictions Follow instructions from your health care provider  about eating and drinking, which may include:  8 hours before the procedure - stop eating heavy meals or foods such as meat, fried foods, or fatty foods.  6 hours before the procedure - stop eating light meals or foods, such as toast or cereal.  6 hours before the procedure - stop drinking milk or drinks that contain milk.  2 hours before the procedure - stop drinking clear liquids.  Medicines Ask your health care provider about:  Changing or stopping your regular medicines. This is especially important if you are taking diabetes medicines or blood thinners.  Taking medicines such as aspirin and ibuprofen. These medicines can thin your blood. Do not take these medicines before your procedure if your health care provider instructs you not to.  Tests and exams  You will have a physical  exam.  You may have blood tests done to show: ? How well your kidneys and liver are working. ? How well your blood can clot.  General instructions  Plan to have someone take you home from the hospital or clinic.  If you will be going home right after the procedure, plan to have someone with you for 24 hours.  What happens during the procedure?  Your blood pressure, heart rate, breathing, level of pain and overall condition will be monitored.  An IV tube will be inserted into one of your veins.  Your anesthesia specialist will give you medicines as needed to keep you comfortable during the procedure. This may mean changing the level of sedation.  The procedure will be performed. After the procedure  Your blood pressure, heart rate, breathing rate, and blood oxygen level will be monitored until the medicines you were given have worn off.  Do not drive for 24 hours if you received a sedative.  You may: ? Feel sleepy, clumsy, or nauseous. ? Feel forgetful about what happened after the procedure. ? Have a sore throat if you had a breathing tube during the procedure. ? Vomit. This information  is not intended to replace advice given to you by your health care provider. Make sure you discuss any questions you have with your health care provider. Document Released: 02/14/2005 Document Revised: 10/28/2015 Document Reviewed: 09/11/2015 Elsevier Interactive Patient Education  2018 [place], Care After These instructions provide you with information about caring for yourself after your procedure. Your health care provider may also give you more specific instructions. Your treatment has been planned according to current medical practices, but problems sometimes occur. Call your health care provider if you have any problems or questions after your procedure. What can I expect after the procedure? After your procedure, it is common to:  Feel sleepy for several hours.  Feel clumsy and have poor balance for several hours.  Feel forgetful about what happened after the procedure.  Have poor judgment for several hours.  Feel nauseous or vomit.  Have a sore throat if you had a breathing tube during the procedure.  Follow these instructions at home: For at least 24 hours after the procedure:   Do not: ? Participate in activities in which you could fall or become injured. ? Drive. ? Use heavy machinery. ? Drink alcohol. ? Take sleeping pills or medicines that cause drowsiness. ? Make important decisions or sign legal documents. ? Take care of children on your own.  Rest. Eating and drinking  Follow the diet that is recommended by your health care provider.  If you vomit, drink water, juice, or soup when you can drink without vomiting.  Make sure you have little or no nausea before eating solid foods. General instructions  Have a responsible adult stay with you until you are awake and alert.  Take over-the-counter and prescription medicines only as told by your health care provider.  If you smoke, do not smoke without supervision.  Keep all  follow-up visits as told by your health care provider. This is important. Contact a health care provider if:  You keep feeling nauseous or you keep vomiting.  You feel light-headed.  You develop a rash.  You have a fever. Get help right away if:  You have trouble breathing. This information is not intended to replace advice given to you by your health care provider. Make sure you discuss any questions you have with your health  care provider. Document Released: 09/11/2015 Document Revised: 01/11/2016 Document Reviewed: 09/11/2015 Elsevier Interactive Patient Education  [name].

## 2017-03-28 ENCOUNTER — Encounter (HOSPITAL_COMMUNITY)
Admission: RE | Admit: 2017-03-28 | Discharge: 2017-03-28 | Disposition: A | Discharge status: Home / Self Care | Payer: Medicare Other | Source: Ambulatory Visit | Attending: Gastroenterology | Admitting: Gastroenterology

## 2017-03-28 ENCOUNTER — Encounter (HOSPITAL_COMMUNITY): Payer: Self-pay

## 2017-03-28 DIAGNOSIS — I739 Peripheral vascular disease, unspecified: Secondary | ICD-10-CM | POA: Diagnosis not present

## 2017-03-28 DIAGNOSIS — N186 End stage renal disease: Secondary | ICD-10-CM | POA: Insufficient documentation

## 2017-03-28 DIAGNOSIS — I509 Heart failure, unspecified: Secondary | ICD-10-CM | POA: Diagnosis not present

## 2017-03-28 DIAGNOSIS — Z01812 Encounter for preprocedural laboratory examination: Secondary | ICD-10-CM | POA: Diagnosis present

## 2017-03-28 DIAGNOSIS — F329 Major depressive disorder, single episode, unspecified: Secondary | ICD-10-CM | POA: Insufficient documentation

## 2017-03-28 DIAGNOSIS — E1122 Type 2 diabetes mellitus with diabetic chronic kidney disease: Secondary | ICD-10-CM | POA: Insufficient documentation

## 2017-03-28 DIAGNOSIS — I132 Hypertensive heart and chronic kidney disease with heart failure and with stage 5 chronic kidney disease, or end stage renal disease: Secondary | ICD-10-CM | POA: Insufficient documentation

## 2017-03-28 DIAGNOSIS — Z87891 Personal history of nicotine dependence: Secondary | ICD-10-CM | POA: Diagnosis not present

## 2017-03-28 DIAGNOSIS — F419 Anxiety disorder, unspecified: Secondary | ICD-10-CM | POA: Insufficient documentation

## 2017-03-28 DIAGNOSIS — Z8673 Personal history of transient ischemic attack (TIA), and cerebral infarction without residual deficits: Secondary | ICD-10-CM | POA: Insufficient documentation

## 2017-03-28 DIAGNOSIS — J449 Chronic obstructive pulmonary disease, unspecified: Secondary | ICD-10-CM | POA: Insufficient documentation

## 2017-03-28 LAB — CBC
HEMATOCRIT: 40.7 % (ref 39.0–52.0)
Hemoglobin: 13 g/dL (ref 13.0–17.0)
MCH: 28.7 pg (ref 26.0–34.0)
MCHC: 31.9 g/dL (ref 30.0–36.0)
MCV: 89.8 fL (ref 78.0–100.0)
PLATELETS: 318 10*3/uL (ref 150–400)
RBC: 4.53 MIL/uL (ref 4.22–5.81)
RDW: 14.8 % (ref 11.5–15.5)
WBC: 14.9 10*3/uL — AB (ref 4.0–10.5)

## 2017-03-28 LAB — BASIC METABOLIC PANEL
Anion gap: 9 (ref 5–15)
BUN: 32 mg/dL — ABNORMAL HIGH (ref 6–20)
CALCIUM: 8.7 mg/dL — AB (ref 8.9–10.3)
CO2: 22 mmol/L (ref 22–32)
CREATININE: 1.41 mg/dL — AB (ref 0.61–1.24)
Chloride: 108 mmol/L (ref 101–111)
GFR, EST AFRICAN AMERICAN: 58 mL/min — AB (ref >60)
GFR, EST NON AFRICAN AMERICAN: 50 mL/min — AB (ref >60)
Glucose, Bld: 194 mg/dL — ABNORMAL HIGH (ref 65–99)
Potassium: 3.9 mmol/L (ref 3.5–5.1)
SODIUM: 139 mmol/L (ref 135–145)

## 2017-03-28 LAB — SURGICAL PCR SCREEN
MRSA, PCR: NEGATIVE
STAPHYLOCOCCUS AUREUS: NEGATIVE

## 2017-03-29 LAB — HEMOGLOBIN A1C
HEMOGLOBIN A1C: 6.2 % — AB (ref 4.8–5.6)
Mean Plasma Glucose: 131 mg/dL

## 2017-04-02 ENCOUNTER — Ambulatory Visit (HOSPITAL_COMMUNITY): Payer: Medicare Other | Admitting: Anesthesiology

## 2017-04-02 ENCOUNTER — Encounter (HOSPITAL_COMMUNITY)
Admission: RE | Disposition: A | Discharge status: Home / Self Care | Payer: Self-pay | Source: Ambulatory Visit | Attending: Gastroenterology

## 2017-04-02 ENCOUNTER — Encounter (HOSPITAL_COMMUNITY): Payer: Self-pay | Admitting: Gastroenterology

## 2017-04-02 ENCOUNTER — Ambulatory Visit (HOSPITAL_COMMUNITY)
Admission: RE | Admit: 2017-04-02 | Discharge: 2017-04-02 | Disposition: A | Discharge status: Home / Self Care | Payer: Medicare Other | Source: Ambulatory Visit | Attending: Gastroenterology | Admitting: Gastroenterology

## 2017-04-02 DIAGNOSIS — K2980 Duodenitis without bleeding: Secondary | ICD-10-CM | POA: Diagnosis not present

## 2017-04-02 DIAGNOSIS — J449 Chronic obstructive pulmonary disease, unspecified: Secondary | ICD-10-CM | POA: Insufficient documentation

## 2017-04-02 DIAGNOSIS — K279 Peptic ulcer, site unspecified, unspecified as acute or chronic, without hemorrhage or perforation: Secondary | ICD-10-CM | POA: Diagnosis not present

## 2017-04-02 DIAGNOSIS — Z87891 Personal history of nicotine dependence: Secondary | ICD-10-CM | POA: Insufficient documentation

## 2017-04-02 DIAGNOSIS — R251 Tremor, unspecified: Secondary | ICD-10-CM | POA: Diagnosis not present

## 2017-04-02 DIAGNOSIS — I5032 Chronic diastolic (congestive) heart failure: Secondary | ICD-10-CM | POA: Insufficient documentation

## 2017-04-02 DIAGNOSIS — Z13810 Encounter for screening for upper gastrointestinal disorder: Secondary | ICD-10-CM

## 2017-04-02 DIAGNOSIS — I69398 Other sequelae of cerebral infarction: Secondary | ICD-10-CM | POA: Insufficient documentation

## 2017-04-02 DIAGNOSIS — M549 Dorsalgia, unspecified: Secondary | ICD-10-CM | POA: Insufficient documentation

## 2017-04-02 DIAGNOSIS — E1142 Type 2 diabetes mellitus with diabetic polyneuropathy: Secondary | ICD-10-CM | POA: Insufficient documentation

## 2017-04-02 DIAGNOSIS — M79605 Pain in left leg: Secondary | ICD-10-CM | POA: Diagnosis not present

## 2017-04-02 DIAGNOSIS — F329 Major depressive disorder, single episode, unspecified: Secondary | ICD-10-CM | POA: Insufficient documentation

## 2017-04-02 DIAGNOSIS — I110 Hypertensive heart disease with heart failure: Secondary | ICD-10-CM | POA: Insufficient documentation

## 2017-04-02 DIAGNOSIS — Z888 Allergy status to other drugs, medicaments and biological substances status: Secondary | ICD-10-CM | POA: Insufficient documentation

## 2017-04-02 DIAGNOSIS — Z8711 Personal history of peptic ulcer disease: Secondary | ICD-10-CM | POA: Diagnosis not present

## 2017-04-02 DIAGNOSIS — Z79899 Other long term (current) drug therapy: Secondary | ICD-10-CM | POA: Insufficient documentation

## 2017-04-02 DIAGNOSIS — M79604 Pain in right leg: Secondary | ICD-10-CM | POA: Insufficient documentation

## 2017-04-02 DIAGNOSIS — E1151 Type 2 diabetes mellitus with diabetic peripheral angiopathy without gangrene: Secondary | ICD-10-CM | POA: Insufficient documentation

## 2017-04-02 DIAGNOSIS — Z91018 Allergy to other foods: Secondary | ICD-10-CM | POA: Insufficient documentation

## 2017-04-02 DIAGNOSIS — Z8719 Personal history of other diseases of the digestive system: Secondary | ICD-10-CM

## 2017-04-02 DIAGNOSIS — M48061 Spinal stenosis, lumbar region without neurogenic claudication: Secondary | ICD-10-CM | POA: Diagnosis not present

## 2017-04-02 DIAGNOSIS — E7800 Pure hypercholesterolemia, unspecified: Secondary | ICD-10-CM | POA: Diagnosis not present

## 2017-04-02 DIAGNOSIS — Z794 Long term (current) use of insulin: Secondary | ICD-10-CM | POA: Insufficient documentation

## 2017-04-02 DIAGNOSIS — G8929 Other chronic pain: Secondary | ICD-10-CM | POA: Insufficient documentation

## 2017-04-02 HISTORY — PX: ESOPHAGOGASTRODUODENOSCOPY (EGD) WITH PROPOFOL: SHX5813

## 2017-04-02 LAB — GLUCOSE, CAPILLARY
GLUCOSE-CAPILLARY: 147 mg/dL — AB (ref 65–99)
GLUCOSE-CAPILLARY: 152 mg/dL — AB (ref 65–99)

## 2017-04-02 SURGERY — ESOPHAGOGASTRODUODENOSCOPY (EGD) WITH PROPOFOL
Anesthesia: Monitor Anesthesia Care

## 2017-04-02 MED ORDER — PROPOFOL 500 MG/50ML IV EMUL
INTRAVENOUS | Status: DC | PRN
  Administered 2017-04-02: 100 ug/kg/min via INTRAVENOUS

## 2017-04-02 MED ORDER — FENTANYL CITRATE (PF) 100 MCG/2ML IJ SOLN
25.0000 ug | Freq: Once | INTRAMUSCULAR | Status: AC
  Administered 2017-04-02: 25 ug via INTRAVENOUS

## 2017-04-02 MED ORDER — MIDAZOLAM HCL 2 MG/2ML IJ SOLN
1.0000 mg | INTRAMUSCULAR | Status: AC
  Administered 2017-04-02: 2 mg via INTRAVENOUS
  Filled 2017-04-02: qty 2

## 2017-04-02 MED ORDER — LIDOCAINE VISCOUS 2 % MT SOLN: 5.0000 mL | Freq: Once | OROMUCOSAL | Status: DC

## 2017-04-02 MED ORDER — LIDOCAINE HCL (CARDIAC) 20 MG/ML IV SOLN
INTRAVENOUS | Status: DC | PRN
  Administered 2017-04-02: 50 mg via INTRAVENOUS

## 2017-04-02 MED ORDER — PANTOPRAZOLE SODIUM 40 MG PO TBEC: 40.0000 mg | DELAYED_RELEASE_TABLET | Freq: Every day | ORAL | 11 refills | Status: DC

## 2017-04-02 MED ORDER — LIDOCAINE VISCOUS 2 % MT SOLN
OROMUCOSAL | Status: AC
  Filled 2017-04-02: qty 15

## 2017-04-02 MED ORDER — LACTATED RINGERS IV SOLN
INTRAVENOUS | Status: DC
  Administered 2017-04-02: 09:00:00 via INTRAVENOUS

## 2017-04-02 MED ORDER — FENTANYL CITRATE (PF) 100 MCG/2ML IJ SOLN
INTRAMUSCULAR | Status: AC
  Filled 2017-04-02: qty 2

## 2017-04-02 MED ORDER — PROPOFOL 10 MG/ML IV BOLUS
INTRAVENOUS | Status: DC | PRN
  Administered 2017-04-02: 50 mg via INTRAVENOUS

## 2017-04-02 MED ORDER — PROPOFOL 10 MG/ML IV BOLUS
INTRAVENOUS | Status: AC
  Filled 2017-04-02: qty 20

## 2017-04-02 NOTE — Anesthesia Postprocedure Evaluation (Signed)
Anesthesia Post Note  Patient: Denton Meek Verley  Procedure(s) Performed: ESOPHAGOGASTRODUODENOSCOPY (EGD) WITH PROPOFOL (N/A )  Patient location during evaluation: Endoscopy Anesthesia Type: MAC Level of consciousness: awake and alert Pain management: pain level controlled Vital Signs Assessment: post-procedure vital signs reviewed and stable Respiratory status: spontaneous breathing, nonlabored ventilation, respiratory function stable and patient connected to nasal cannula oxygen Cardiovascular status: blood pressure returned to baseline and stable Postop Assessment: no apparent nausea or vomiting Anesthetic complications: no     Last Vitals:  Vitals:   04/02/17 0945 04/02/17 1010  BP: 124/69 111/62  Pulse:  (!) 50  Resp: 15   Temp:  36.6 C  SpO2: 93% 98%    Last Pain:  Vitals:   04/02/17 0914  TempSrc: Oral                 Talitha Givens

## 2017-04-02 NOTE — H&P (Signed)
Primary Care Physician:  [name], MD Primary Gastroenterologist:  Dr. Oneida Alar  Pre-Procedure History & Physical: HPI:  Eric Scott is a 66 y.o. male here for PUD.  Past Medical History:  Diagnosis Date  . Anxiety   . Chronic pain    legs, back; MRI 05/2012 with mild thoracic degenerative changes no spinal stenosis   . COPD (chronic obstructive pulmonary disease) ([place])   . Depression   . Diabetic peripheral neuropathy ([place])    "chronic" (05/27/2012)  . Diastolic CHF ([place])   . Hypercholesteremia   . Hypertension   . Peripheral edema   . PVD (peripheral vascular disease) ([place])   . Spinal stenosis    mild lumbar (MRI 05/2012)-L2-L3 to L4-L5 , mild lumbar foraminal stenosis   . Stroke ([place]) 05/2012   Subacute, lacunar infarcts within the left basal ganglia and posterior limp of the left internal capsule/thalamus; "RUE; both feet weak" (05/27/2012)  . Tremor   . Type II diabetes mellitus ([place])     Past Surgical History:  Procedure Laterality Date  . ANKLE SURGERY    . ESOPHAGOGASTRODUODENOSCOPY (EGD) WITH PROPOFOL N/A 12/21/2016   Procedure: ESOPHAGOGASTRODUODENOSCOPY (EGD) WITH PROPOFOL;  Surgeon: Daneil Dolin, MD;  Location: [facility];  Service: Gastroenterology;  Laterality: N/A;  . HERNIA REPAIR  01/05/2004   "belly button" (05/27/2012)    Prior to Admission medications   Medication Sig Start Date End Date Taking? Authorizing Provider  albuterol (PROVENTIL) (2.5 MG/3ML) 0.083% nebulizer solution Take 2.5 mg by nebulization every 3 (three) hours as needed for wheezing or shortness of breath.   Yes [provider]  ALPRAZolam Duanne Moron) 1 MG tablet Take 1 tablet (1 mg total) by mouth 3 (three) times daily. 12/24/16  Yes Arrien, Jimmy Picket, MD  Amino Acids-Protein Hydrolys (FEEDING SUPPLEMENT, PRO-STAT SUGAR FREE 64,) LIQD Take 30 mLs by mouth 3 (three) times daily with meals.   Yes [provider]  ascorbic acid (VITAMIN C) 500 MG tablet Take 500  mg by mouth daily.   Yes [provider]  BREO ELLIPTA 100-25 MCG/INH AEPB Inhale 2 puffs into the lungs daily.  09/27/16  Yes [provider]  Calcium Carbonate Antacid (TUMS PO) Take 2 tablets by mouth 2 (two) times daily.   Yes [provider]  cholecalciferol (VITAMIN D) 1000 units tablet Take 1,000 Units by mouth daily.   Yes [provider]  citalopram (CELEXA) 20 MG tablet Take 20 mg by mouth daily.   Yes [provider]  ferrous sulfate 325 (65 FE) MG tablet Take 325 mg by mouth daily with breakfast.   Yes [provider]  fish oil-omega-3 fatty acids 1000 MG capsule Take 1 g by mouth 2 (two) times daily.   Yes [provider]  HUMALOG KWIKPEN 100 UNIT/ML KiwkPen Inject 2-14 Units into the skin 3 (three) times daily. Per sliding scale 150-200= 2 units 201-250= 4 units 251-300= 6 units 301-349= 8 units 350-400=10units 401-450=12units 451-500=14units ABOVE 501- CALL MD 10/07/16  Yes [provider]  ipratropium-albuterol (DUONEB) 0.5-2.5 (3) MG/3ML SOLN Inhale 3 mLs into the lungs 3 (three) times daily.  07/09/16  Yes [provider]  metoprolol tartrate (LOPRESSOR) 25 MG tablet Take 1 tablet (25 mg total) by mouth 2 (two) times daily. 12/24/16 03/20/17 Yes Arrien, Jimmy Picket, MD  Multiple Vitamin (MULTIVITAMIN) tablet Take 1 tablet by mouth daily.   Yes [provider]  oxyCODONE (OXY IR/ROXICODONE) 5 MG immediate release tablet Take 1 tablet (5  mg total) by mouth every 8 (eight) hours as needed for severe pain. Patient taking differently: Take 5 mg by mouth 4 (four) times daily.  12/24/16  Yes Arrien, Jimmy Picket, MD  pantoprazole (PROTONIX) 40 MG tablet Take 1 tablet (40 mg total) by mouth 2 (two) times daily before a meal. 12/24/16 03/20/17 Yes Arrien, Jimmy Picket, MD  potassium chloride SA (K-DUR,KLOR-CON) 20 MEQ tablet Take 40 mEq by mouth daily.   Yes [provider]  sucralfate  (CARAFATE) 1 g tablet Take 1 g by mouth 4 (four) times daily -  with meals and at bedtime.   Yes [provider]    Allergies as of 02/13/2017 - Review Complete 02/13/2017  Allergen Reaction Noted  . Blueberry flavor  05/29/2016  . Cucumber extract Other (See Comments) 08/01/2012  . Flexeril [cyclobenzaprine hcl] Other (See Comments) 07/20/2010  . Kiwi extract Other (See Comments) 08/01/2012    Family History  Problem Relation Age of Onset  . Colon cancer Neg Hx     Social History   Social History  . Marital status: Legally Separated    Spouse name: N/A  . Number of children: N/A  . Years of education: N/A   Occupational History  . Not on file.   Social History Main Topics  . Smoking status: Former Smoker    Packs/day: 0.50    Years: 45.00    Types: Cigarettes    Quit date: 12/28/2014  . Smokeless tobacco: Never Used  . Alcohol use No     Comment: 05/27/2012 "drank gallons and gallons 20 yr ago or so; last drink  at least 10 yr ago"  . Drug use: No  . Sexual activity: No   Other Topics Concern  . Not on file   Social History Narrative   Patient lives in [place] with a friend.   He is divorced. Has one kid.   Not working for about 7 years now. Used to do plumbing work.    Review of Systems: See HPI, otherwise negative ROS   Physical Exam: There were no vitals taken for this visit. General:   Alert,  pleasant and cooperative in NAD Head:  Normocephalic and atraumatic. Neck:  Supple; Lungs:  Clear throughout to auscultation.    Heart:  Regular rate and rhythm. Abdomen:  Soft, nontender and nondistended. Normal bowel sounds, without guarding, and without rebound.   Neurologic:  Alert and  oriented x4;  grossly normal neurologically.  Impression/Plan:     PUD  PLAN:  REPEAT EGD TO CONFIRM ULCERS ARE HEALED. DISCUSSED PROCEDURE, BENEFITS, & RISKS: < 1% chance of medication reaction, OR bleeding.

## 2017-04-02 NOTE — Transfer of Care (Signed)
Immediate Anesthesia Transfer of Care Note  Patient: Eric Scott  Procedure(s) Performed: ESOPHAGOGASTRODUODENOSCOPY (EGD) WITH PROPOFOL (N/A )  Patient Location: PACU  Anesthesia Type:MAC  Level of Consciousness: awake, alert  and oriented  Airway & Oxygen Therapy: Patient Spontanous Breathing and Patient connected to nasal cannula oxygen  Post-op Assessment: Report given to RN, Post -op Vital signs reviewed and stable and Patient moving all extremities X 4  Post vital signs: Reviewed and stable  Last Vitals:  Vitals:   04/02/17 0945 04/02/17 1010  BP: 124/69 111/62  Pulse:  (!) 50  Resp: 15   Temp:    SpO2: 93% 98%    Last Pain:  Vitals:   04/02/17 0914  TempSrc: Oral         Complications: No apparent anesthesia complications

## 2017-04-02 NOTE — Anesthesia Preprocedure Evaluation (Signed)
Anesthesia Evaluation  Patient identified by MRN, date of birth, ID band Patient awake    Reviewed: Allergy & Precautions, NPO status , Patient's Chart, lab work & pertinent test results  Airway Mallampati: I  TM Distance: >3 FB     Dental  (+) Teeth Intact   Pulmonary COPD ( Hx hypoxemic with respiratory failure ), former smoker,    breath sounds clear to auscultation       Cardiovascular hypertension, Pt. on medications + Peripheral Vascular Disease and +CHF   Rhythm:Regular Rate:Normal     Neuro/Psych PSYCHIATRIC DISORDERS Anxiety Depression  Neuromuscular disease CVA    GI/Hepatic PUD,   Endo/Other  diabetes  Renal/GU ESRFRenal disease     Musculoskeletal   Abdominal   Peds  Hematology   Anesthesia Other Findings   Reproductive/Obstetrics                             Anesthesia Physical Anesthesia Plan  ASA: IV  Anesthesia Plan: MAC   Post-op Pain Management:    Induction: Intravenous  PONV Risk Score and Plan:   Airway Management Planned: Simple Face Mask  Additional Equipment:   Intra-op Plan:   Post-operative Plan:   Informed Consent: I have reviewed the patients History and Physical, chart, labs and discussed the procedure including the risks, benefits and alternatives for the proposed anesthesia with the patient or authorized representative who has indicated his/her understanding and acceptance.     Plan Discussed with:   Anesthesia Plan Comments:         Anesthesia Quick Evaluation

## 2017-04-02 NOTE — Discharge Instructions (Signed)
Your ESOPHAGUS,AND STOMACH ARE HEALED. YOU STILL HAVE MID DUODENITIS (INFLAMMATION IN YOUR SMALL BOWEL).   CONTINUE PROTONIX. DECREASE TO TAKE 30 MINUTES PRIOR TO BREAKFAST ONCE DAILY FOREVER.  STOP THE CARAFATE.  IT OK TO RE-START ASPIRIN OR BLOOD THINNERS IF MEDICALLY NECESSARY.  FOLLOW UP IN APR 2019.    UPPER ENDOSCOPY AFTER CARE Read the instructions outlined below and refer to this sheet in the next week. These discharge instructions provide you with general information on caring for yourself after you leave the hospital. While your treatment has been planned according to the most current medical practices available, unavoidable complications occasionally occur. If you have any problems or questions after discharge, call DR. Jamile Rekowski, [phone].  ACTIVITY  You may resume your regular activity, but move at a slower pace for the next 24 hours.   Take frequent rest periods for the next 24 hours.   Walking will help get rid of the air and reduce the bloated feeling in your belly (abdomen).   No driving for 24 hours (because of the medicine (anesthesia) used during the test).   You may shower.   Do not sign any important legal documents or operate any machinery for 24 hours (because of the anesthesia used during the test).    NUTRITION  Drink plenty of fluids.   You may resume your normal diet as instructed by your doctor.   Begin with a light meal and progress to your normal diet. Heavy or fried foods are harder to digest and may make you feel sick to your stomach (nauseated).   Avoid alcoholic beverages for 24 hours or as instructed.    MEDICATIONS  You may resume your normal medications.   WHAT YOU CAN EXPECT TODAY  Some feelings of bloating in the abdomen.   Passage of more gas than usual.    IF YOU HAD A BIOPSY TAKEN DURING THE UPPER ENDOSCOPY:  Eat a soft diet IF YOU HAVE NAUSEA, BLOATING, ABDOMINAL PAIN, OR VOMITING.    FINDING OUT THE RESULTS OF YOUR  TEST Not all test results are available during your visit. DR. Oneida Alar WILL CALL YOU WITHIN 7 DAYS OF YOUR PROCEDUE WITH YOUR RESULTS. Do not assume everything is normal if you have not heard from DR. Rushawn Capshaw IN ONE WEEK, CALL HER OFFICE AT [phone].  SEEK IMMEDIATE MEDICAL ATTENTION AND CALL THE OFFICE: [phone] IF:  You have more than a spotting of blood in your stool.   Your belly is swollen (abdominal distention).   You are nauseated or vomiting.   You have a temperature over 101F.   You have abdominal pain or discomfort that is severe or gets worse throughout the day.

## 2017-04-02 NOTE — Op Note (Addendum)
[facility] Patient Name: Eric Scott Procedure Date: 04/02/2017 9:36 AM MRN: [id number] Date of Birth: 1950-06-12 Attending MD: Barney Drain MD, MD CSN: [id number] Age: 66 Admit Type: Outpatient Procedure:                Upper GI endoscopy, SCREENING Indications:              Assessment of upper GI pathologic condition for                            potential impact on the management of this                            patient's other diseases, Screening procedure Providers:                Barney Drain MD, MD, Janeece Riggers, RN, Selena Lesser, Randa Spike, Technician Referring MD:             [name], MD Medicines:                Propofol per Anesthesia Complications:            No immediate complications. Estimated Blood Loss:     Estimated blood loss: none. Procedure:                Pre-Anesthesia Assessment:                           - Prior to the procedure, a History and Physical                            was performed, and patient medications and                            allergies were reviewed. The patient's tolerance of                            previous anesthesia was also reviewed. The risks                            and benefits of the procedure and the sedation                            options and risks were discussed with the patient.                            All questions were answered, and informed consent                            was obtained. Prior Anticoagulants: The patient has                            taken no previous anticoagulant or antiplatelet  agents. ASA Grade Assessment: II - A patient with                            mild systemic disease. After reviewing the risks                            and benefits, the patient was deemed in                            satisfactory condition to undergo the procedure.                            After obtaining informed consent, the endoscope  was                            passed under direct vision. Throughout the                            procedure, the patient's blood pressure, pulse, and                            oxygen saturations were monitored continuously. The                            [id number]-[id number] ([id number]) scope was introduced through the                            mouth, and advanced to the second part of duodenum.                            The upper GI endoscopy was accomplished without                            difficulty. The patient tolerated the procedure                            well. Scope In: 9:59:45 AM Scope Out: 10:02:48 AM Total Procedure Duration: 0 hours 3 minutes 3 seconds  Findings:      The examined esophagus was normal.      The entire examined stomach was normal.      Patchy mild inflammation characterized by congestion (edema) and       erythema was found in the duodenal bulb.      The second portion of the duodenum was normal. Impression:               - ESOPAHGITIS/GASTRIC AND DUODENAL ULCERS HEALED                           - MILD Duodenitis. Moderate Sedation:      Per Anesthesia Care Recommendation:           - Resume previous diet.                           - Continue present medications. REDUCE PROTONIX TO  ONCE DAILY FOREVER. STOP CARAFATE. OK TO RE-START                            ASA AND ANTICOAGULATION IF MEDICALLY NECESSARY.                           - Return to my office in 6 months.                           - Patient has a contact number available for                            emergencies. The signs and symptoms of potential                            delayed complications were discussed with the                            patient. Return to normal activities tomorrow.                            Written discharge instructions were provided to the                            patient. Procedure Code(s):        --- Professional ---                            [phone], Esophagogastroduodenoscopy, flexible,                            transoral; diagnostic, including collection of                            specimen(s) by brushing or washing, when performed                            (separate procedure) Diagnosis Code(s):        --- Professional ---                           K29.80, Duodenitis without bleeding                           Z87.19, Personal history of other diseases of the                            digestive system                           Z13.810, Encounter for screening for upper                            gastrointestinal disorder CPT copyright 2016 [employer]. All rights reserved. The codes documented in this report are preliminary and upon coder review may  be revised to  meet current compliance requirements. Barney Drain, MD Barney Drain MD, MD 04/02/2017 10:18:04 AM This report has been signed electronically. Number of Addenda: 0

## 2017-04-03 ENCOUNTER — Encounter (HOSPITAL_COMMUNITY): Payer: Self-pay | Admitting: Gastroenterology

## 2017-04-15 ENCOUNTER — Encounter (INDEPENDENT_AMBULATORY_CARE_PROVIDER_SITE_OTHER): Payer: Self-pay | Admitting: Orthopedic Surgery

## 2017-04-15 ENCOUNTER — Ambulatory Visit (INDEPENDENT_AMBULATORY_CARE_PROVIDER_SITE_OTHER): Payer: Medicare Other | Admitting: Orthopedic Surgery

## 2017-04-15 VITALS — Ht 72.0 in | Wt 256.0 lb

## 2017-04-15 DIAGNOSIS — I87333 Chronic venous hypertension (idiopathic) with ulcer and inflammation of bilateral lower extremity: Secondary | ICD-10-CM

## 2017-04-15 DIAGNOSIS — T07XXXA Unspecified multiple injuries, initial encounter: Secondary | ICD-10-CM

## 2017-04-15 DIAGNOSIS — L97929 Non-pressure chronic ulcer of unspecified part of left lower leg with unspecified severity: Secondary | ICD-10-CM

## 2017-04-15 DIAGNOSIS — L97919 Non-pressure chronic ulcer of unspecified part of right lower leg with unspecified severity: Secondary | ICD-10-CM | POA: Diagnosis not present

## 2017-04-15 DIAGNOSIS — L89622 Pressure ulcer of left heel, stage 2: Secondary | ICD-10-CM

## 2017-04-15 NOTE — Progress Notes (Signed)
Office Visit Note   Patient: Eric Scott           Date of Birth: 1950-09-30           MRN: [id number] Visit Date: 04/15/2017              Requested by: [name], [address], [place] [address] PCP: [name], MD  Chief Complaint  Patient presents with  . Left Foot - Follow-up    bilat LE heel ulcers  . Right Foot - Follow-up      HPI: Patient is a 66 year old gentleman smoker diabetic insensate neuropathy who has venous stasis changes of both legs he is currently total assistance for transfers he has had chronic osteomyelitis of the left calcaneus. He is currently wearing PRAFO boots bilaterally with dressing changes to both lower extremities he is currently at skilled nursing.  Assessment & Plan: Visit Diagnoses:  1. Pressure injury of left heel, stage 2   2. 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 Wounds, multiple     Plan: continue with the PRAFO boots bilaterally 24 hours a continue dressing changes to the foot and left heel. Patient does not want to consider surgical intervention for the osteomyelitis of the calcaneus.  Follow-Up Instructions: Return in about 4 weeks (around 05/13/2017).   Ortho Exam  Patient is alert, oriented, no adenopathy, well-dressed, normal affect, normal respiratory effort. Examination patient is angling in a wheelchair. He does have a tremor of the upper extremities. Examination the left leg he has good granulation tissue over the tibial crest abrasion ulcer. There is no cellulitis no drainage no odor no signs of infection. Examination the left heel the ulcer has thin fibrinous tissue. Was debrided with gauze. Has a posteromedial ulcer this is 6 mm in diameter. Posterior heel ulcer 1 cm in diameter. Both 1 mm deep. No purulence or surrounding erythema. No tenderness. Does have anterior ankle ulcer likely from dressing pressure this is 7 mm in diameter  and filled in with granulation tissue. Is 1 mm deep. There is no cellulitis no odor no drainage. Right leg is stable without ulcer or cellulitis. There is brawny skin color changes in both legs.  Imaging: No results found. No images are attached to the encounter.  Labs: Lab Results  Component Value Date   HGBA1C 6.2 (H) 03/28/2017   HGBA1C 5.7 (H) 07/20/2014   HGBA1C 5.3 05/28/2012   ESRSEDRATE 60 (H) 11/26/2016   CRP 3.2 (H) 11/26/2016   LABURIC 11.5 (H) 08/24/2009   REPTSTATUS 12/15/2016 FINAL 12/10/2016   GRAMSTAIN  02/05/2013    RARE WBC PRESENT, PREDOMINANTLY PMN NO SQUAMOUS EPITHELIAL CELLS SEEN FEW GRAM POSITIVE COCCI IN PAIRS Performed at [facility]   CULT NO GROWTH 5 DAYS 12/10/2016   LABORGA ENTEROCOCCUS FAECALIS (A) 11/08/2016    Orders:  No orders of the defined types were placed in this encounter.  No orders of the defined types were placed in this encounter.    Procedures: No procedures performed  Clinical Data: No additional findings.  ROS:  All other systems negative, except as noted in the HPI. Review of Systems  Constitutional: Negative for chills and fever.  Cardiovascular: Positive for leg swelling.  Skin: Positive for wound. Negative for color change.    Objective: Vital Signs: Ht 6' (1.829 m)   Wt 256 lb (116.1 kg)   BMI 34.72 kg/m   Specialty  Comments:  No specialty comments available.  PMFS History: Patient Active Problem List   Diagnosis Date Noted  . PUD (peptic ulcer disease)   . Gastric erosions 02/13/2017  . Duodenal ulcer 02/13/2017  . Transaminitis   . UGI bleed   . Arm DVT (deep venous thromboembolism), acute, right ([facility]) 12/18/2016  . Uremia 12/10/2016  . Acute renal failure ([place])   . Volume depletion 11/23/2016  . Hypotension   . Liver enzyme elevation   . 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 Arterial insufficiency of lower  extremity ([place]) 11/15/2016  . Pressure injury of skin 11/09/2016  . Sepsis ([place]) 11/09/2016  . Osteomyelitis ([place]) 11/09/2016  . Wounds, multiple 11/08/2016  . Chronic venous insufficiency 10/12/2016  . Critical lower limb ischemia 10/12/2016  . Fatty liver 09/10/2016  . History of colonic polyps 09/10/2016  . Gallstone 09/10/2016  . Edema 03/04/2015  . AKI (acute kidney injury) ([place]) 03/04/2015  . Fall 03/04/2015  . Generalized weakness 03/04/2015  . Diabetic polyneuropathy associated with type 2 diabetes mellitus ([place])   . Anxiety   . PVD (peripheral vascular disease) ([place])   . HCAP (healthcare-associated pneumonia) 07/20/2014  . Pneumonia 07/20/2014  . Weight gain 05/02/2014  . Coarse tremors 04/18/2014  . Anxiety state 04/18/2014  . Acute hypoxemic respiratory failure ([place]) 04/07/2014  . Chronic diastolic CHF (congestive heart failure) ([place]) 04/07/2014  . Solitary pulmonary nodule 04/07/2014  . COPD with exacerbation ([place]) 04/02/2014  . Hypoxia 04/02/2014  . CVA (cerebral infarction) 06/01/2012  . Chronic pain (back, legs) 05/30/2012  . Toe laceration, 4th toe 05/30/2012  . C. difficile diarrhea 05/30/2012  . Hypokalemia 05/30/2012  . Acute lacunar stroke ([place]) 05/27/2012  . DM type 2 (diabetes mellitus, type 2) ([place]) 05/27/2012  . Smoker 05/27/2012  . HTN (hypertension) 05/27/2012   Past Medical History:  Diagnosis Date  . Anxiety   . Chronic pain    legs, back; MRI 05/2012 with mild thoracic degenerative changes no spinal stenosis   . COPD (chronic obstructive pulmonary disease) ([place])   . Depression   . Diabetic peripheral neuropathy ([place])    "chronic" (05/27/2012)  . Diastolic CHF ([place])   . Hypercholesteremia   . Hypertension   . Peripheral edema   . PVD (peripheral vascular disease) ([place])   . Spinal stenosis    mild lumbar (MRI 05/2012)-L2-L3 to L4-L5 , mild lumbar foraminal stenosis   . Stroke ([place]) 05/2012   Subacute, lacunar infarcts within the left  basal ganglia and posterior limp of the left internal capsule/thalamus; "RUE; both feet weak" (05/27/2012)  . Tremor   . Type II diabetes mellitus ([name])     Family History  Problem Relation Age of Onset  . Colon cancer Neg Hx     Past Surgical History:  Procedure Laterality Date  . ANKLE SURGERY    . HERNIA REPAIR  01/05/2004   "belly button" (05/27/2012)   Social History   Occupational History  . Not on file  Tobacco Use  . Smoking status: Former Smoker    Packs/day: 0.50    Years: 45.00    Pack years: 22.50    Types: Cigarettes    Last attempt to quit: 12/28/2014    Years since quitting: 2.2  . Smokeless tobacco: Never Used  Substance and Sexual Activity  . Alcohol use: No    Comment: 05/27/2012 "drank gallons and gallons 20 yr ago or so; last drink  at  least 10 yr ago"  . Drug use: No  . Sexual activity: No

## 2017-05-15 ENCOUNTER — Ambulatory Visit (INDEPENDENT_AMBULATORY_CARE_PROVIDER_SITE_OTHER): Payer: Medicare Other | Admitting: Orthopedic Surgery

## 2017-06-18 ENCOUNTER — Encounter: Payer: Self-pay | Admitting: Nurse Practitioner

## 2017-06-18 ENCOUNTER — Ambulatory Visit (INDEPENDENT_AMBULATORY_CARE_PROVIDER_SITE_OTHER): Payer: Medicare Other | Admitting: Nurse Practitioner

## 2017-06-18 VITALS — BP 145/72 | HR 51 | Temp 97.0°F | Ht 72.0 in | Wt 258.0 lb

## 2017-06-18 DIAGNOSIS — A0472 Enterocolitis due to Clostridium difficile, not specified as recurrent: Secondary | ICD-10-CM

## 2017-06-18 DIAGNOSIS — K279 Peptic ulcer, site unspecified, unspecified as acute or chronic, without hemorrhage or perforation: Secondary | ICD-10-CM

## 2017-06-18 DIAGNOSIS — K922 Gastrointestinal hemorrhage, unspecified: Secondary | ICD-10-CM | POA: Diagnosis not present

## 2017-06-18 NOTE — Progress Notes (Signed)
Referring Provider: [name], MD Primary Care Physician:  [name], MD Primary GI:  Dr. Oneida Alar  Chief Complaint  Patient presents with  . C. Diff    f/u, doing ok    HPI:   Eric Scott is a 67 y.o. male who presents for follow-up on diarrhea.  The patient was last seen in office 02/13/2017 for C. difficile diarrhea, upper GI bleed, acute gastric erosion, and duodenal ulcer.  Hospitalization in July 2018 for C. difficile diarrhea, acute on chronic kidney disease, right upper extremity DVT requiring anticoagulation, upper GI bleed with acute blood loss anemia.  His low hemoglobin was 6.6 and noted melena.  EGD was completed which found ulcerative reflux esophagitis, erosive gastropathy, extensive duodenal ulcerations as likely source of bleeding.  He was maintained on a PPI drip for 72 hours during his hospitalization and Carafate 4 times a day.  Received a total of 2 units of PRBCs transfused during his admission and his discharge hemoglobin was 9.0.  Continued anticoagulation was deemed prohibitive at that time.  He did undergo treatment of C. difficile with vancomycin.  At his last visit he was accompanied by nursing facility staff from where he lives.  Remained off the blood thinner.  No GI symptoms at that time.  Recommended recheck a CBC, endoscopic surveillance of ulcers and erosions.   CBC most recently drawn 03/28/2017 which found hemoglobin of 13.0.  Repeat EGD completed 04/02/2017 which found esophagitis, gastric and duodenal ulcers all healed.  Noted mild duodenitis.  Recommended reduce Protonix to once daily forever, stop Carafate.  Okay to restart anticoagulation if medically necessary.  Reviewed nursing home records.  A CBC was checked on 06/11/2017 which found mild anemia with hemoglobin of 12.1 which is stable from his previous value of 13.  His indices remain normal.  Iron mildly low at 53, TIBC mildly low at 230.  Folic acid, Z22 normal.  No results found for  ferritin.  Today he states he's doing well overall. No more diarrhea. Denies GERD, abdominal pain, N/V, hematochezia, melena, fever, chills, unintentional weight loss. Denies dizziness, lightheadedness, syncope, near syncope. Denies chest pain, dyspnea, dizziness, lightheadedness, syncope, near syncope. Denies any other upper or lower GI symptoms.  Past Medical History:  Diagnosis Date  . Anxiety   . Chronic pain    legs, back; MRI 05/2012 with mild thoracic degenerative changes no spinal stenosis   . COPD (chronic obstructive pulmonary disease) ([place])   . Depression   . Diabetic peripheral neuropathy ([place])    "chronic" (05/27/2012)  . Diastolic CHF ([place])   . Hypercholesteremia   . Hypertension   . Peripheral edema   . PVD (peripheral vascular disease) ([place])   . Spinal stenosis    mild lumbar (MRI 05/2012)-L2-L3 to L4-L5 , mild lumbar foraminal stenosis   . Stroke ([place]) 05/2012   Subacute, lacunar infarcts within the left basal ganglia and posterior limp of the left internal capsule/thalamus; "RUE; both feet weak" (05/27/2012)  . Tremor   . Type II diabetes mellitus ([place])     Past Surgical History:  Procedure Laterality Date  . ANKLE SURGERY    . ESOPHAGOGASTRODUODENOSCOPY (EGD) WITH PROPOFOL N/A 12/21/2016   Procedure: ESOPHAGOGASTRODUODENOSCOPY (EGD) WITH PROPOFOL;  Surgeon: Daneil Dolin, MD;  Location: [facility];  Service: Gastroenterology;  Laterality: N/A;  . ESOPHAGOGASTRODUODENOSCOPY (EGD) WITH PROPOFOL N/A 04/02/2017   Procedure: ESOPHAGOGASTRODUODENOSCOPY (EGD) WITH PROPOFOL;  Surgeon: Danie Binder, MD;  Location: [facility];  Service:  Endoscopy;  Laterality: N/A;  1030   . HERNIA REPAIR  01/05/2004   "belly button" (05/27/2012)    Current Outpatient Medications  Medication Sig Dispense Refill  . albuterol (PROVENTIL) (2.5 MG/3ML) 0.083% nebulizer solution Take 2.5 mg by nebulization every 3 (three) hours as needed for wheezing or shortness of breath.    .  ALPRAZolam (XANAX) 1 MG tablet Take 1 tablet (1 mg total) by mouth 3 (three) times daily. 10 tablet 0  . ascorbic acid (VITAMIN C) 500 MG tablet Take 500 mg by mouth daily.    Marland Kitchen BREO ELLIPTA 100-25 MCG/INH AEPB Inhale 2 puffs into the lungs daily.     . cholecalciferol (VITAMIN D) 1000 units tablet Take 5,000 Units by mouth daily.     . citalopram (CELEXA) 20 MG tablet Take 20 mg by mouth daily.    . cyanocobalamin 1000 MCG tablet Take 1,000 mcg by mouth daily.    . ferrous sulfate 325 (65 FE) MG tablet Take 325 mg by mouth daily with breakfast.    . fish oil-omega-3 fatty acids 1000 MG capsule Take 1 g by mouth 2 (two) times daily.    Marland Kitchen HUMALOG KWIKPEN 100 UNIT/ML KiwkPen Inject 2-14 Units into the skin 3 (three) times daily. Per sliding scale 150-200= 2 units 201-250= 4 units 251-300= 6 units 301-349= 8 units 350-400=10units 401-450=12units 451-500=14units ABOVE 501- CALL MD    . ipratropium-albuterol (DUONEB) 0.5-2.5 (3) MG/3ML SOLN Inhale 3 mLs into the lungs 3 (three) times daily.     . metoprolol tartrate (LOPRESSOR) 25 MG tablet Take 1 tablet (25 mg total) by mouth 2 (two) times daily. 60 tablet 0  . Multiple Vitamin (MULTIVITAMIN) tablet Take 1 tablet by mouth daily.    Marland Kitchen oxyCODONE (OXY IR/ROXICODONE) 5 MG immediate release tablet Take 1 tablet (5 mg total) by mouth every 8 (eight) hours as needed for severe pain. (Patient taking differently: Take 5 mg by mouth 2 (two) times daily. Takes twice daily and prn) 10 tablet 0  . pantoprazole (PROTONIX) 40 MG tablet Take 1 tablet (40 mg total) by mouth daily before breakfast. 30 tablet 11  . potassium chloride SA (K-DUR,KLOR-CON) 20 MEQ tablet Take 40 mEq by mouth daily.     No current facility-administered medications for this visit.     Allergies as of 06/18/2017 - Review Complete 06/18/2017  Allergen Reaction Noted  . Blueberry flavor Other (See Comments) 05/29/2016  . Cucumber extract Other (See Comments) 08/01/2012  . Flexeril  [cyclobenzaprine hcl] Other (See Comments) 07/20/2010  . Kiwi extract Other (See Comments) 08/01/2012    Family History  Problem Relation Age of Onset  . Colon cancer Neg Hx     Social History   Socioeconomic History  . Marital status: Legally Separated    Spouse name: None  . Number of children: None  . Years of education: None  . Highest education level: None  Social Needs  . Financial resource strain: None  . Food insecurity - worry: None  . Food insecurity - inability: None  . Transportation needs - medical: None  . Transportation needs - non-medical: None  Occupational History  . None  Tobacco Use  . Smoking status: Current Every Day Smoker    Packs/day: 0.50    Years: 45.00    Pack years: 22.50    Types: Cigarettes    Last attempt to quit: 12/28/2014    Years since quitting: 2.4  . Smokeless tobacco: Never Used  Substance and Sexual Activity  .  Alcohol use: No    Comment: 05/27/2012 "drank gallons and gallons 20 yr ago or so; last drink  at least 10 yr ago"  . Drug use: No  . Sexual activity: No  Other Topics Concern  . None  Social History Narrative   Patient lives in [place] with a friend.   He is divorced. Has one kid.   Not working for about 7 years now. Used to do plumbing work.    Review of Systems: Complete ROS negative except as per HPI.   Physical Exam: BP (!) 145/72   Pulse (!) 51   Temp (!) 97 F (36.1 C) (Oral)   Ht 6' (1.829 m)   Wt 258 lb (117 kg)   BMI 34.99 kg/m  General:   Alert and oriented. Pleasant and cooperative. Well-nourished and well-developed. Sitting in wheelchair. Eyes:  Without icterus, sclera clear and conjunctiva pink.  Ears:  Normal auditory acuity. Cardiovascular:  S1, S2 present without murmurs appreciated. Extremities without clubbing or edema. Respiratory:  Clear to auscultation bilaterally. No wheezes, rales, or rhonchi. No distress.  Gastrointestinal:  +BS, rounded but soft, non-tender and non-distended.  No HSM noted. No guarding or rebound. No masses appreciated.  Rectal:  Deferred  Musculoskalatal:  Symmetrical without gross deformities. Neurologic:  Alert and oriented x4;  grossly normal neurologically. Psych:  Alert and cooperative. Normal mood and affect. Heme/Lymph/Immune: No excessive bruising noted.    06/18/2017 10:41 AM   Disclaimer: This note was dictated with voice recognition software. Similar sounding words can inadvertently be transcribed and may not be corrected upon review.

## 2017-06-18 NOTE — Progress Notes (Signed)
cc'd to pcp 

## 2017-06-18 NOTE — Assessment & Plan Note (Signed)
Ulcer disease and erosions found to be healed on last follow-up EGD.  Denies any GERD symptoms.  Recommend he continue PPI once daily.  Return for follow-up in 1 year.

## 2017-06-18 NOTE — Patient Instructions (Signed)
1. Continue taking your acid blocker for now. 2. I have recommended that the nursing home check your blood cell count about every 6 months as [place] as she do not have symptoms, in order to monitor for any worsening anemia. 3. If you see any bleeding or develops symptoms of blood loss they can check it more frequently. 4. Notify us as soon as possible for any recurrent diarrhea or noted blood in your stools or vomiting blood. 5. Otherwise, return for follow-up in 1 year. 6. Call us if you have any questions or concerns.

## 2017-06-18 NOTE — Assessment & Plan Note (Signed)
Anemia status post upper GI bleed is stabilized.  His last 2 hemoglobins were 13.0 and 12.1.  He is asymptomatic.  Recommend the nursing facility monitor his CBC about every 6 months as [name] as he remains asymptomatic.  Taking recheck sooner if he develops symptoms or obvious GI bleeding.  Recommend notify us of any new bleeding/recurrent bleeding.  Otherwise, follow-up in our office in 1 year.  Continue PPI.

## 2017-06-18 NOTE — Assessment & Plan Note (Signed)
Diarrhea appears to have completely resolved at this point.  No diarrhea in the last 2 visits.  No need for retesting of C. difficile.  Notify us of any recurrent diarrhea.  Follow-up in 1 year.

## 2018-03-08 DIAGNOSIS — F321 Major depressive disorder, single episode, moderate: Secondary | ICD-10-CM | POA: Diagnosis not present

## 2018-03-08 DIAGNOSIS — R69 Illness, unspecified: Secondary | ICD-10-CM | POA: Diagnosis not present

## 2018-03-24 DIAGNOSIS — M6281 Muscle weakness (generalized): Secondary | ICD-10-CM | POA: Diagnosis not present

## 2018-03-24 DIAGNOSIS — Z9181 History of falling: Secondary | ICD-10-CM | POA: Diagnosis not present

## 2018-03-25 DIAGNOSIS — Z9181 History of falling: Secondary | ICD-10-CM | POA: Diagnosis not present

## 2018-03-25 DIAGNOSIS — M6281 Muscle weakness (generalized): Secondary | ICD-10-CM | POA: Diagnosis not present

## 2018-03-26 DIAGNOSIS — R69 Illness, unspecified: Secondary | ICD-10-CM | POA: Diagnosis not present

## 2018-03-26 DIAGNOSIS — M6281 Muscle weakness (generalized): Secondary | ICD-10-CM | POA: Diagnosis not present

## 2018-03-26 DIAGNOSIS — I10 Essential (primary) hypertension: Secondary | ICD-10-CM | POA: Diagnosis not present

## 2018-03-26 DIAGNOSIS — Z9181 History of falling: Secondary | ICD-10-CM | POA: Diagnosis not present

## 2018-03-26 DIAGNOSIS — G629 Polyneuropathy, unspecified: Secondary | ICD-10-CM | POA: Diagnosis not present

## 2018-03-27 DIAGNOSIS — M6281 Muscle weakness (generalized): Secondary | ICD-10-CM | POA: Diagnosis not present

## 2018-03-27 DIAGNOSIS — Z9181 History of falling: Secondary | ICD-10-CM | POA: Diagnosis not present

## 2018-03-28 DIAGNOSIS — M6281 Muscle weakness (generalized): Secondary | ICD-10-CM | POA: Diagnosis not present

## 2018-03-28 DIAGNOSIS — Z9181 History of falling: Secondary | ICD-10-CM | POA: Diagnosis not present

## 2018-03-31 DIAGNOSIS — Z9181 History of falling: Secondary | ICD-10-CM | POA: Diagnosis not present

## 2018-03-31 DIAGNOSIS — M6281 Muscle weakness (generalized): Secondary | ICD-10-CM | POA: Diagnosis not present

## 2018-04-01 DIAGNOSIS — M6281 Muscle weakness (generalized): Secondary | ICD-10-CM | POA: Diagnosis not present

## 2018-04-01 DIAGNOSIS — Z9181 History of falling: Secondary | ICD-10-CM | POA: Diagnosis not present

## 2018-04-02 DIAGNOSIS — M6281 Muscle weakness (generalized): Secondary | ICD-10-CM | POA: Diagnosis not present

## 2018-04-02 DIAGNOSIS — Z9181 History of falling: Secondary | ICD-10-CM | POA: Diagnosis not present

## 2018-04-03 DIAGNOSIS — L97911 Non-pressure chronic ulcer of unspecified part of right lower leg limited to breakdown of skin: Secondary | ICD-10-CM | POA: Diagnosis not present

## 2018-04-03 DIAGNOSIS — M6281 Muscle weakness (generalized): Secondary | ICD-10-CM | POA: Diagnosis not present

## 2018-04-03 DIAGNOSIS — Z9181 History of falling: Secondary | ICD-10-CM | POA: Diagnosis not present

## 2018-04-04 DIAGNOSIS — E119 Type 2 diabetes mellitus without complications: Secondary | ICD-10-CM | POA: Diagnosis not present

## 2018-04-04 DIAGNOSIS — Z9181 History of falling: Secondary | ICD-10-CM | POA: Diagnosis not present

## 2018-04-04 DIAGNOSIS — N184 Chronic kidney disease, stage 4 (severe): Secondary | ICD-10-CM | POA: Diagnosis not present

## 2018-04-04 DIAGNOSIS — M2550 Pain in unspecified joint: Secondary | ICD-10-CM | POA: Diagnosis not present

## 2018-04-04 DIAGNOSIS — D649 Anemia, unspecified: Secondary | ICD-10-CM | POA: Diagnosis not present

## 2018-04-04 DIAGNOSIS — R2241 Localized swelling, mass and lump, right lower limb: Secondary | ICD-10-CM | POA: Diagnosis not present

## 2018-04-04 DIAGNOSIS — I10 Essential (primary) hypertension: Secondary | ICD-10-CM | POA: Diagnosis not present

## 2018-04-04 DIAGNOSIS — E46 Unspecified protein-calorie malnutrition: Secondary | ICD-10-CM | POA: Diagnosis not present

## 2018-04-04 DIAGNOSIS — M6281 Muscle weakness (generalized): Secondary | ICD-10-CM | POA: Diagnosis not present

## 2018-04-07 DIAGNOSIS — N183 Chronic kidney disease, stage 3 (moderate): Secondary | ICD-10-CM | POA: Diagnosis not present

## 2018-04-07 DIAGNOSIS — M6281 Muscle weakness (generalized): Secondary | ICD-10-CM | POA: Diagnosis not present

## 2018-04-07 DIAGNOSIS — Z9181 History of falling: Secondary | ICD-10-CM | POA: Diagnosis not present

## 2018-04-07 DIAGNOSIS — L97911 Non-pressure chronic ulcer of unspecified part of right lower leg limited to breakdown of skin: Secondary | ICD-10-CM | POA: Diagnosis not present

## 2018-04-07 DIAGNOSIS — L03115 Cellulitis of right lower limb: Secondary | ICD-10-CM | POA: Diagnosis not present

## 2018-04-08 DIAGNOSIS — Z9181 History of falling: Secondary | ICD-10-CM | POA: Diagnosis not present

## 2018-04-08 DIAGNOSIS — M6281 Muscle weakness (generalized): Secondary | ICD-10-CM | POA: Diagnosis not present

## 2018-04-09 DIAGNOSIS — M6281 Muscle weakness (generalized): Secondary | ICD-10-CM | POA: Diagnosis not present

## 2018-04-09 DIAGNOSIS — Z9181 History of falling: Secondary | ICD-10-CM | POA: Diagnosis not present

## 2018-04-10 DIAGNOSIS — M6281 Muscle weakness (generalized): Secondary | ICD-10-CM | POA: Diagnosis not present

## 2018-04-10 DIAGNOSIS — Z9181 History of falling: Secondary | ICD-10-CM | POA: Diagnosis not present

## 2018-04-10 DIAGNOSIS — L97919 Non-pressure chronic ulcer of unspecified part of right lower leg with unspecified severity: Secondary | ICD-10-CM | POA: Diagnosis not present

## 2018-04-11 DIAGNOSIS — Z9181 History of falling: Secondary | ICD-10-CM | POA: Diagnosis not present

## 2018-04-11 DIAGNOSIS — M6281 Muscle weakness (generalized): Secondary | ICD-10-CM | POA: Diagnosis not present

## 2018-04-14 DIAGNOSIS — M6281 Muscle weakness (generalized): Secondary | ICD-10-CM | POA: Diagnosis not present

## 2018-04-14 DIAGNOSIS — L03115 Cellulitis of right lower limb: Secondary | ICD-10-CM | POA: Diagnosis not present

## 2018-04-14 DIAGNOSIS — L89619 Pressure ulcer of right heel, unspecified stage: Secondary | ICD-10-CM | POA: Diagnosis not present

## 2018-04-14 DIAGNOSIS — Z9181 History of falling: Secondary | ICD-10-CM | POA: Diagnosis not present

## 2018-04-15 ENCOUNTER — Other Ambulatory Visit: Payer: Self-pay

## 2018-04-15 DIAGNOSIS — I872 Venous insufficiency (chronic) (peripheral): Secondary | ICD-10-CM

## 2018-04-15 DIAGNOSIS — Z9181 History of falling: Secondary | ICD-10-CM | POA: Diagnosis not present

## 2018-04-15 DIAGNOSIS — M6281 Muscle weakness (generalized): Secondary | ICD-10-CM | POA: Diagnosis not present

## 2018-04-16 DIAGNOSIS — L97919 Non-pressure chronic ulcer of unspecified part of right lower leg with unspecified severity: Secondary | ICD-10-CM | POA: Diagnosis not present

## 2018-04-16 DIAGNOSIS — Z9181 History of falling: Secondary | ICD-10-CM | POA: Diagnosis not present

## 2018-04-16 DIAGNOSIS — L97412 Non-pressure chronic ulcer of right heel and midfoot with fat layer exposed: Secondary | ICD-10-CM | POA: Diagnosis not present

## 2018-04-16 DIAGNOSIS — M6281 Muscle weakness (generalized): Secondary | ICD-10-CM | POA: Diagnosis not present

## 2018-04-17 DIAGNOSIS — M6281 Muscle weakness (generalized): Secondary | ICD-10-CM | POA: Diagnosis not present

## 2018-04-17 DIAGNOSIS — Z9181 History of falling: Secondary | ICD-10-CM | POA: Diagnosis not present

## 2018-04-18 DIAGNOSIS — Z9181 History of falling: Secondary | ICD-10-CM | POA: Diagnosis not present

## 2018-04-18 DIAGNOSIS — M6281 Muscle weakness (generalized): Secondary | ICD-10-CM | POA: Diagnosis not present

## 2018-04-21 DIAGNOSIS — Z9181 History of falling: Secondary | ICD-10-CM | POA: Diagnosis not present

## 2018-04-21 DIAGNOSIS — M6281 Muscle weakness (generalized): Secondary | ICD-10-CM | POA: Diagnosis not present

## 2018-04-22 DIAGNOSIS — L97911 Non-pressure chronic ulcer of unspecified part of right lower leg limited to breakdown of skin: Secondary | ICD-10-CM | POA: Diagnosis not present

## 2018-04-22 DIAGNOSIS — M6281 Muscle weakness (generalized): Secondary | ICD-10-CM | POA: Diagnosis not present

## 2018-04-22 DIAGNOSIS — Z9181 History of falling: Secondary | ICD-10-CM | POA: Diagnosis not present

## 2018-04-22 DIAGNOSIS — L03115 Cellulitis of right lower limb: Secondary | ICD-10-CM | POA: Diagnosis not present

## 2018-04-23 DIAGNOSIS — L97912 Non-pressure chronic ulcer of unspecified part of right lower leg with fat layer exposed: Secondary | ICD-10-CM | POA: Diagnosis not present

## 2018-04-23 DIAGNOSIS — Z9181 History of falling: Secondary | ICD-10-CM | POA: Diagnosis not present

## 2018-04-23 DIAGNOSIS — L97419 Non-pressure chronic ulcer of right heel and midfoot with unspecified severity: Secondary | ICD-10-CM | POA: Diagnosis not present

## 2018-04-23 DIAGNOSIS — S91301D Unspecified open wound, right foot, subsequent encounter: Secondary | ICD-10-CM | POA: Diagnosis not present

## 2018-04-23 DIAGNOSIS — M6281 Muscle weakness (generalized): Secondary | ICD-10-CM | POA: Diagnosis not present

## 2018-04-23 DIAGNOSIS — S81801D Unspecified open wound, right lower leg, subsequent encounter: Secondary | ICD-10-CM | POA: Diagnosis not present

## 2018-04-23 DIAGNOSIS — L97413 Non-pressure chronic ulcer of right heel and midfoot with necrosis of muscle: Secondary | ICD-10-CM | POA: Diagnosis not present

## 2018-04-24 DIAGNOSIS — M6281 Muscle weakness (generalized): Secondary | ICD-10-CM | POA: Diagnosis not present

## 2018-04-24 DIAGNOSIS — Z9181 History of falling: Secondary | ICD-10-CM | POA: Diagnosis not present

## 2018-04-25 DIAGNOSIS — M6281 Muscle weakness (generalized): Secondary | ICD-10-CM | POA: Diagnosis not present

## 2018-04-25 DIAGNOSIS — Z9181 History of falling: Secondary | ICD-10-CM | POA: Diagnosis not present

## 2018-04-28 DIAGNOSIS — Z9181 History of falling: Secondary | ICD-10-CM | POA: Diagnosis not present

## 2018-04-28 DIAGNOSIS — M6281 Muscle weakness (generalized): Secondary | ICD-10-CM | POA: Diagnosis not present

## 2018-04-29 DIAGNOSIS — M6281 Muscle weakness (generalized): Secondary | ICD-10-CM | POA: Diagnosis not present

## 2018-04-29 DIAGNOSIS — Z9181 History of falling: Secondary | ICD-10-CM | POA: Diagnosis not present

## 2018-04-30 DIAGNOSIS — M6281 Muscle weakness (generalized): Secondary | ICD-10-CM | POA: Diagnosis not present

## 2018-04-30 DIAGNOSIS — Z9181 History of falling: Secondary | ICD-10-CM | POA: Diagnosis not present

## 2018-05-01 DIAGNOSIS — Z9181 History of falling: Secondary | ICD-10-CM | POA: Diagnosis not present

## 2018-05-01 DIAGNOSIS — M6281 Muscle weakness (generalized): Secondary | ICD-10-CM | POA: Diagnosis not present

## 2018-05-02 DIAGNOSIS — M6281 Muscle weakness (generalized): Secondary | ICD-10-CM | POA: Diagnosis not present

## 2018-05-02 DIAGNOSIS — Z9181 History of falling: Secondary | ICD-10-CM | POA: Diagnosis not present

## 2018-05-05 DIAGNOSIS — M6281 Muscle weakness (generalized): Secondary | ICD-10-CM | POA: Diagnosis not present

## 2018-05-05 DIAGNOSIS — Z9181 History of falling: Secondary | ICD-10-CM | POA: Diagnosis not present

## 2018-05-06 DIAGNOSIS — M6281 Muscle weakness (generalized): Secondary | ICD-10-CM | POA: Diagnosis not present

## 2018-05-06 DIAGNOSIS — Z9181 History of falling: Secondary | ICD-10-CM | POA: Diagnosis not present

## 2018-05-07 DIAGNOSIS — Z9181 History of falling: Secondary | ICD-10-CM | POA: Diagnosis not present

## 2018-05-07 DIAGNOSIS — M6281 Muscle weakness (generalized): Secondary | ICD-10-CM | POA: Diagnosis not present

## 2018-05-07 DIAGNOSIS — L97912 Non-pressure chronic ulcer of unspecified part of right lower leg with fat layer exposed: Secondary | ICD-10-CM | POA: Diagnosis not present

## 2018-05-07 DIAGNOSIS — L97419 Non-pressure chronic ulcer of right heel and midfoot with unspecified severity: Secondary | ICD-10-CM | POA: Diagnosis not present

## 2018-05-08 DIAGNOSIS — Z9181 History of falling: Secondary | ICD-10-CM | POA: Diagnosis not present

## 2018-05-08 DIAGNOSIS — M6281 Muscle weakness (generalized): Secondary | ICD-10-CM | POA: Diagnosis not present

## 2018-05-09 DIAGNOSIS — Z9181 History of falling: Secondary | ICD-10-CM | POA: Diagnosis not present

## 2018-05-09 DIAGNOSIS — M6281 Muscle weakness (generalized): Secondary | ICD-10-CM | POA: Diagnosis not present

## 2018-05-12 ENCOUNTER — Encounter: Payer: Self-pay | Admitting: Gastroenterology

## 2018-05-12 DIAGNOSIS — M6281 Muscle weakness (generalized): Secondary | ICD-10-CM | POA: Diagnosis not present

## 2018-05-12 DIAGNOSIS — Z9181 History of falling: Secondary | ICD-10-CM | POA: Diagnosis not present

## 2018-05-13 DIAGNOSIS — M6281 Muscle weakness (generalized): Secondary | ICD-10-CM | POA: Diagnosis not present

## 2018-05-13 DIAGNOSIS — Z9181 History of falling: Secondary | ICD-10-CM | POA: Diagnosis not present

## 2018-05-14 DIAGNOSIS — L97912 Non-pressure chronic ulcer of unspecified part of right lower leg with fat layer exposed: Secondary | ICD-10-CM | POA: Diagnosis not present

## 2018-05-14 DIAGNOSIS — M6281 Muscle weakness (generalized): Secondary | ICD-10-CM | POA: Diagnosis not present

## 2018-05-14 DIAGNOSIS — Z9181 History of falling: Secondary | ICD-10-CM | POA: Diagnosis not present

## 2018-05-14 DIAGNOSIS — L97419 Non-pressure chronic ulcer of right heel and midfoot with unspecified severity: Secondary | ICD-10-CM | POA: Diagnosis not present

## 2018-05-15 DIAGNOSIS — Z9181 History of falling: Secondary | ICD-10-CM | POA: Diagnosis not present

## 2018-05-15 DIAGNOSIS — M6281 Muscle weakness (generalized): Secondary | ICD-10-CM | POA: Diagnosis not present

## 2018-05-16 DIAGNOSIS — Z9181 History of falling: Secondary | ICD-10-CM | POA: Diagnosis not present

## 2018-05-16 DIAGNOSIS — M6281 Muscle weakness (generalized): Secondary | ICD-10-CM | POA: Diagnosis not present

## 2018-05-19 DIAGNOSIS — I130 Hypertensive heart and chronic kidney disease with heart failure and stage 1 through stage 4 chronic kidney disease, or unspecified chronic kidney disease: Secondary | ICD-10-CM | POA: Diagnosis not present

## 2018-05-19 DIAGNOSIS — I5032 Chronic diastolic (congestive) heart failure: Secondary | ICD-10-CM | POA: Diagnosis not present

## 2018-05-19 DIAGNOSIS — M6281 Muscle weakness (generalized): Secondary | ICD-10-CM | POA: Diagnosis not present

## 2018-05-19 DIAGNOSIS — E1122 Type 2 diabetes mellitus with diabetic chronic kidney disease: Secondary | ICD-10-CM | POA: Diagnosis not present

## 2018-05-19 DIAGNOSIS — N183 Chronic kidney disease, stage 3 (moderate): Secondary | ICD-10-CM | POA: Diagnosis not present

## 2018-05-19 DIAGNOSIS — Z9181 History of falling: Secondary | ICD-10-CM | POA: Diagnosis not present

## 2018-05-20 DIAGNOSIS — Z9181 History of falling: Secondary | ICD-10-CM | POA: Diagnosis not present

## 2018-05-20 DIAGNOSIS — M6281 Muscle weakness (generalized): Secondary | ICD-10-CM | POA: Diagnosis not present

## 2018-05-21 DIAGNOSIS — L97919 Non-pressure chronic ulcer of unspecified part of right lower leg with unspecified severity: Secondary | ICD-10-CM | POA: Diagnosis not present

## 2018-05-21 DIAGNOSIS — S91301D Unspecified open wound, right foot, subsequent encounter: Secondary | ICD-10-CM | POA: Diagnosis not present

## 2018-05-21 DIAGNOSIS — S81801D Unspecified open wound, right lower leg, subsequent encounter: Secondary | ICD-10-CM | POA: Diagnosis not present

## 2018-06-06 IMAGING — DX DG KNEE COMPLETE 4+V*R*
4 series · 4 of 4 positions shown · non-contrast
Comparison: 03/04/2015

CLINICAL DATA: Fall from bed, knee pain and erythema.

EXAM:
RIGHT KNEE - COMPLETE 4+ VIEW

[knee ap]
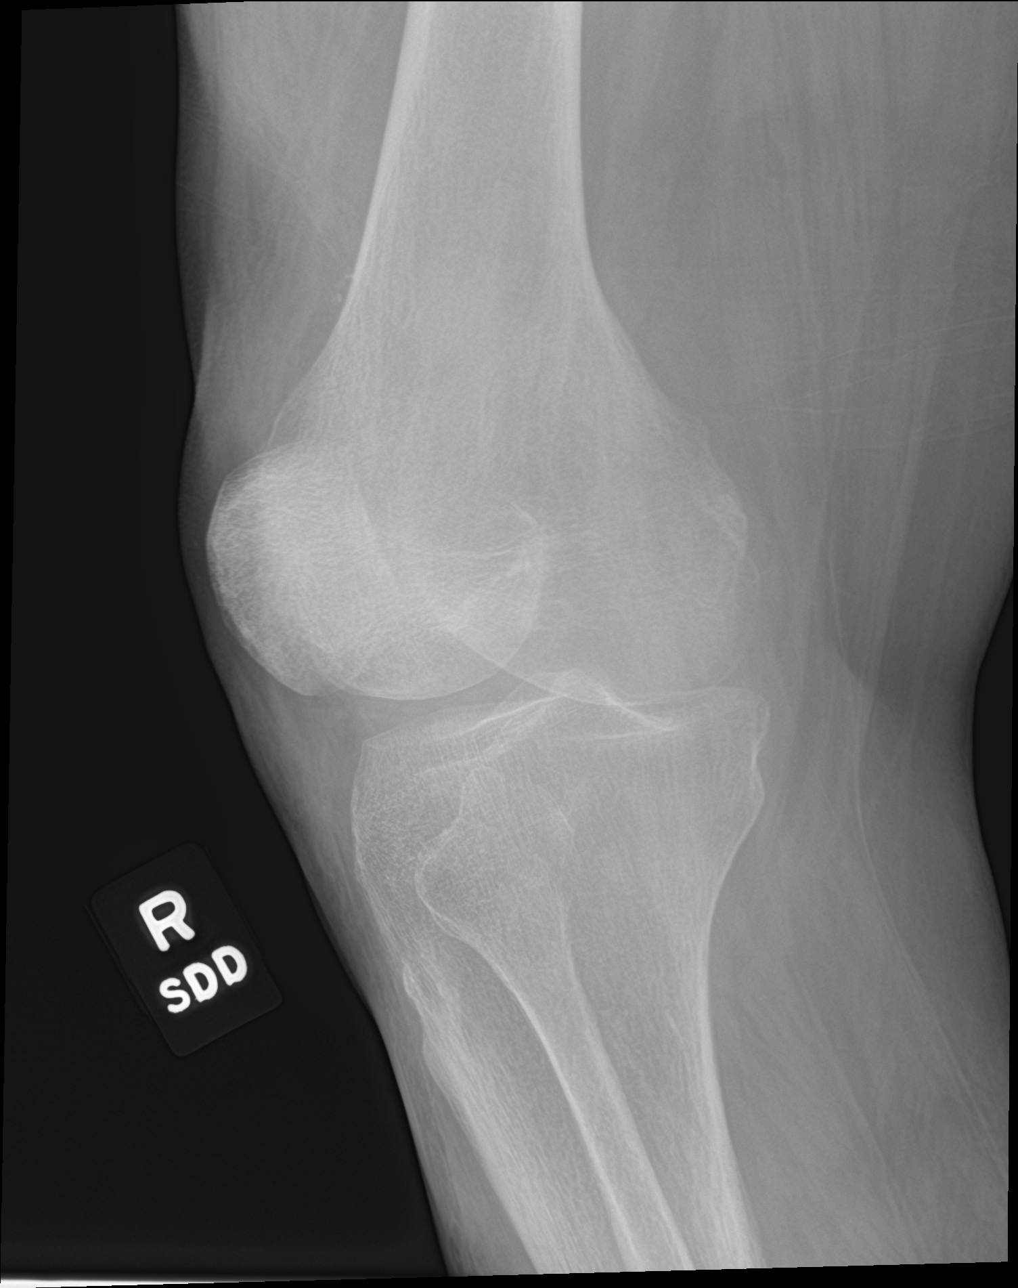

[tunnel]
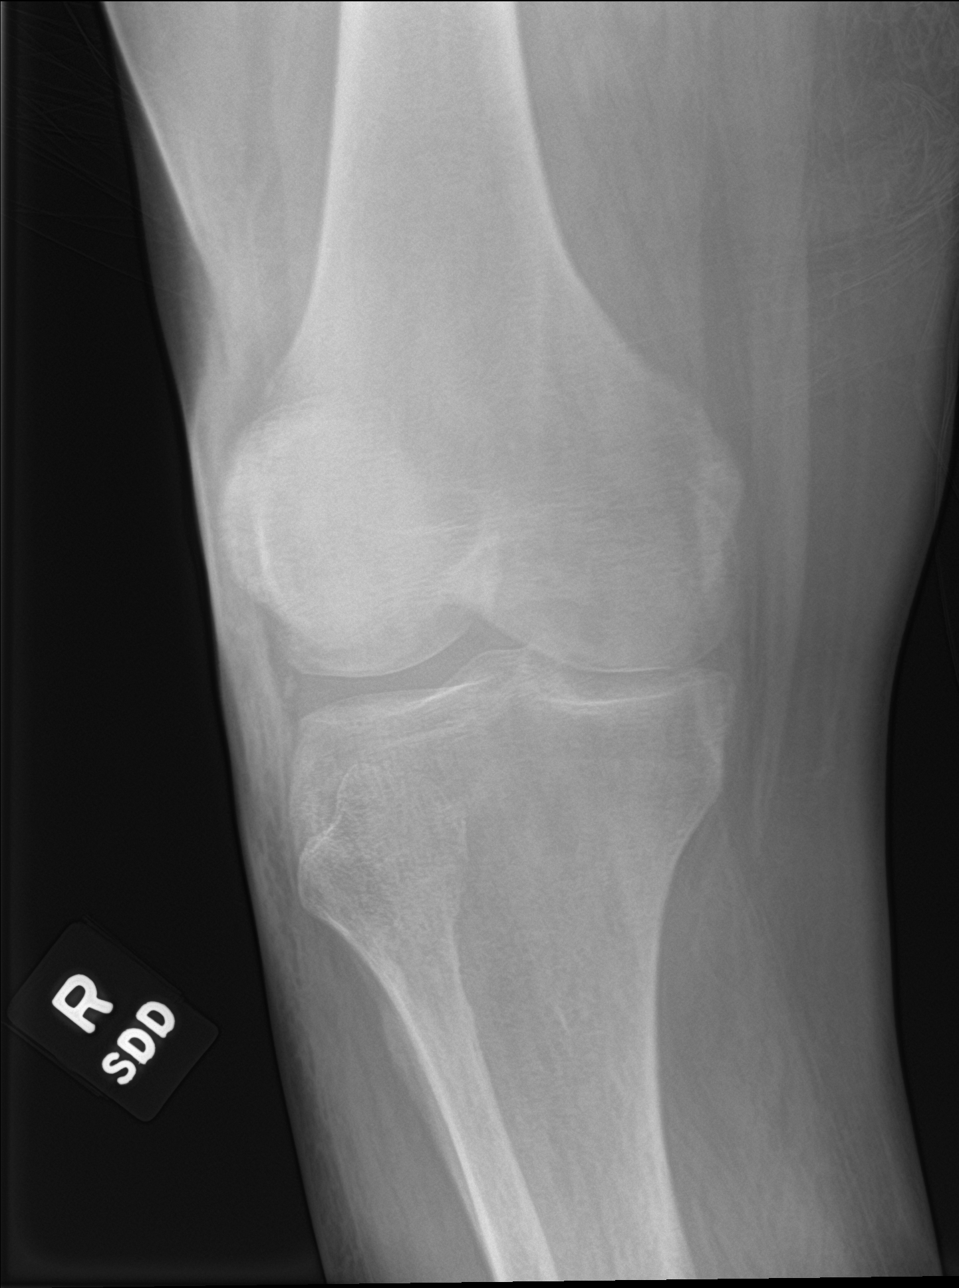

[knee lat]
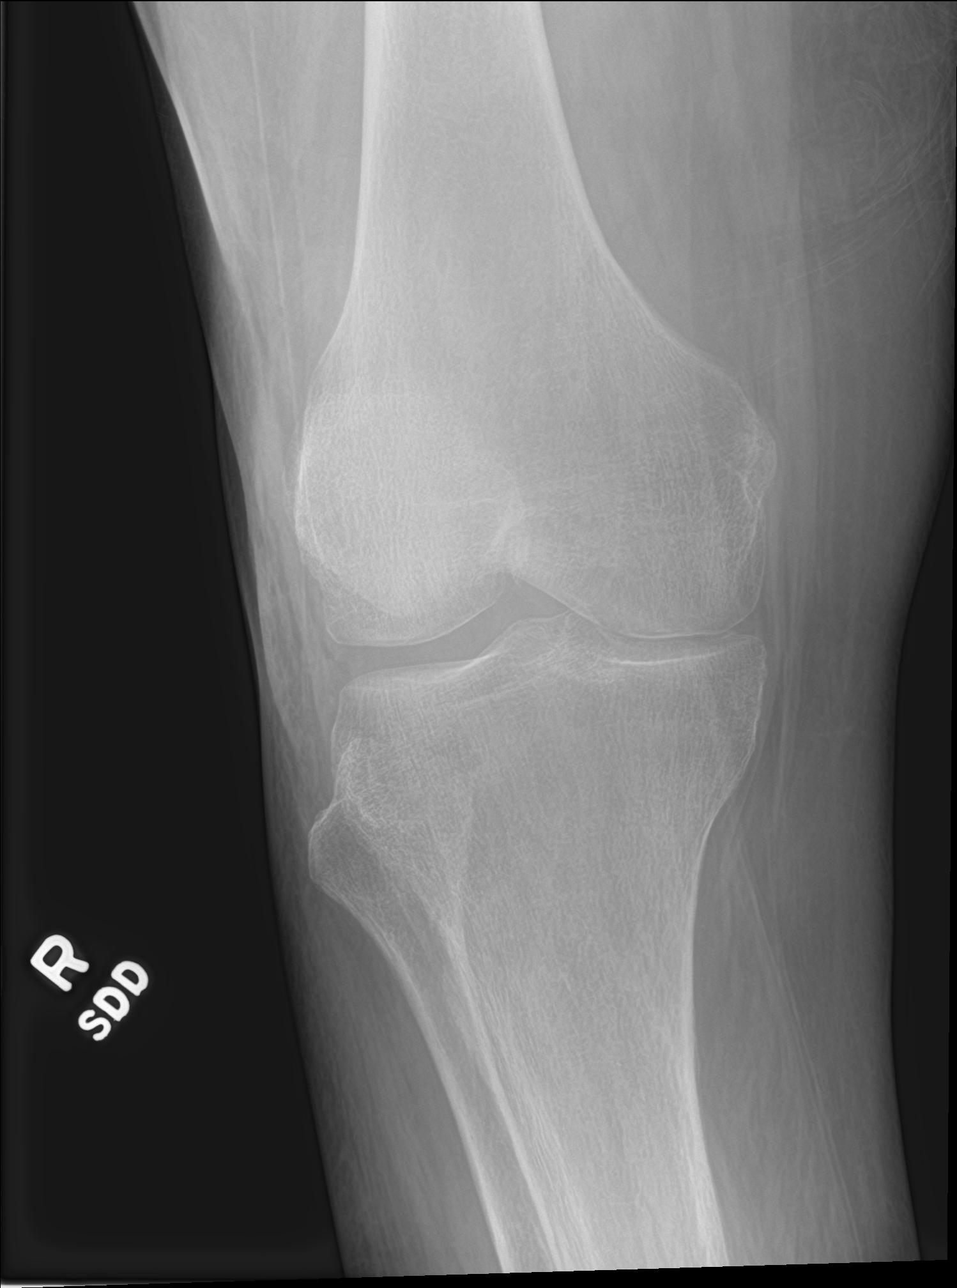

[knee sunrise]
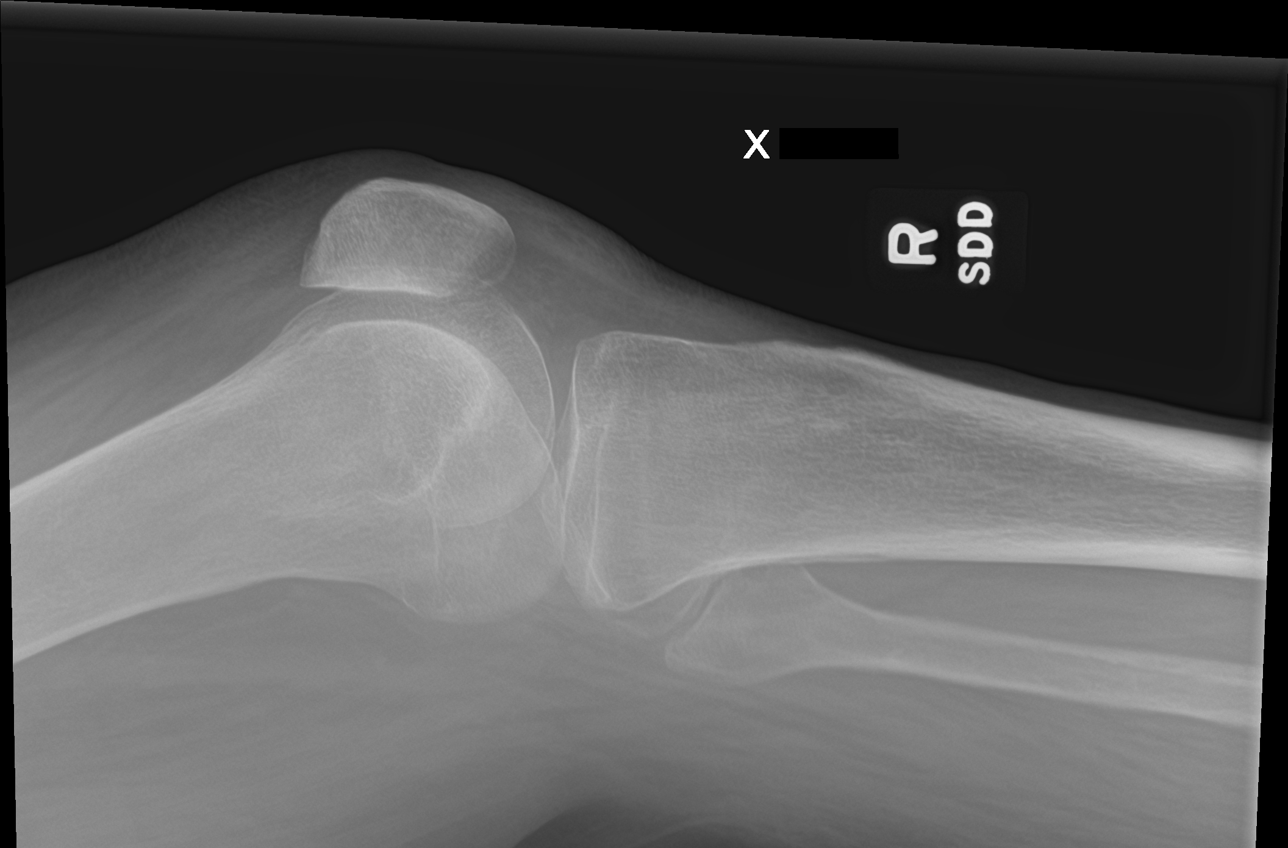

[4 of 4 positions shown; findings below may reference images not displayed]

FINDINGS: Motion artifact on several images.

No fracture or overt knee effusion.

Suspected mild prepatellar subcutaneous edema. Medial compartmental
articular space narrowing.
IMPRESSION: 1. Degenerative chondral thinning in the medial compartment.
2. Prepatellar subcutaneous edema.
3. Mildly reduced sensitivity due to motion artifact.

## 2018-06-10 DIAGNOSIS — N183 Chronic kidney disease, stage 3 (moderate): Secondary | ICD-10-CM | POA: Diagnosis not present

## 2018-06-10 DIAGNOSIS — E1122 Type 2 diabetes mellitus with diabetic chronic kidney disease: Secondary | ICD-10-CM | POA: Diagnosis not present

## 2018-06-10 DIAGNOSIS — E559 Vitamin D deficiency, unspecified: Secondary | ICD-10-CM | POA: Diagnosis not present

## 2018-06-10 DIAGNOSIS — K219 Gastro-esophageal reflux disease without esophagitis: Secondary | ICD-10-CM | POA: Diagnosis not present

## 2018-06-11 DIAGNOSIS — L97919 Non-pressure chronic ulcer of unspecified part of right lower leg with unspecified severity: Secondary | ICD-10-CM | POA: Diagnosis not present

## 2018-06-13 ENCOUNTER — Encounter: Payer: Medicare Other | Admitting: Vascular Surgery

## 2018-06-13 ENCOUNTER — Encounter: Payer: Self-pay | Admitting: Vascular Surgery

## 2018-06-13 ENCOUNTER — Encounter (HOSPITAL_COMMUNITY): Payer: Medicare Other

## 2018-06-18 DIAGNOSIS — S81801D Unspecified open wound, right lower leg, subsequent encounter: Secondary | ICD-10-CM | POA: Diagnosis not present

## 2018-06-18 DIAGNOSIS — S91301D Unspecified open wound, right foot, subsequent encounter: Secondary | ICD-10-CM | POA: Diagnosis not present

## 2018-06-18 DIAGNOSIS — L97419 Non-pressure chronic ulcer of right heel and midfoot with unspecified severity: Secondary | ICD-10-CM | POA: Diagnosis not present

## 2018-06-25 DIAGNOSIS — L97419 Non-pressure chronic ulcer of right heel and midfoot with unspecified severity: Secondary | ICD-10-CM | POA: Diagnosis not present

## 2018-07-01 DIAGNOSIS — R2689 Other abnormalities of gait and mobility: Secondary | ICD-10-CM | POA: Diagnosis not present

## 2018-07-01 DIAGNOSIS — L97519 Non-pressure chronic ulcer of other part of right foot with unspecified severity: Secondary | ICD-10-CM | POA: Diagnosis not present

## 2018-07-01 DIAGNOSIS — M6281 Muscle weakness (generalized): Secondary | ICD-10-CM | POA: Diagnosis not present

## 2018-07-02 DIAGNOSIS — K219 Gastro-esophageal reflux disease without esophagitis: Secondary | ICD-10-CM | POA: Diagnosis not present

## 2018-07-02 DIAGNOSIS — M79671 Pain in right foot: Secondary | ICD-10-CM | POA: Diagnosis not present

## 2018-07-02 DIAGNOSIS — G629 Polyneuropathy, unspecified: Secondary | ICD-10-CM | POA: Diagnosis not present

## 2018-07-08 DIAGNOSIS — L97519 Non-pressure chronic ulcer of other part of right foot with unspecified severity: Secondary | ICD-10-CM | POA: Diagnosis not present

## 2018-07-08 DIAGNOSIS — R2689 Other abnormalities of gait and mobility: Secondary | ICD-10-CM | POA: Diagnosis not present

## 2018-07-08 DIAGNOSIS — M6281 Muscle weakness (generalized): Secondary | ICD-10-CM | POA: Diagnosis not present

## 2018-07-11 DIAGNOSIS — S81801D Unspecified open wound, right lower leg, subsequent encounter: Secondary | ICD-10-CM | POA: Diagnosis not present

## 2018-07-11 DIAGNOSIS — S91301D Unspecified open wound, right foot, subsequent encounter: Secondary | ICD-10-CM | POA: Diagnosis not present

## 2018-07-15 DIAGNOSIS — M6281 Muscle weakness (generalized): Secondary | ICD-10-CM | POA: Diagnosis not present

## 2018-07-15 DIAGNOSIS — Z8673 Personal history of transient ischemic attack (TIA), and cerebral infarction without residual deficits: Secondary | ICD-10-CM | POA: Diagnosis not present

## 2018-07-15 DIAGNOSIS — R2689 Other abnormalities of gait and mobility: Secondary | ICD-10-CM | POA: Diagnosis not present

## 2018-07-15 DIAGNOSIS — L97519 Non-pressure chronic ulcer of other part of right foot with unspecified severity: Secondary | ICD-10-CM | POA: Diagnosis not present

## 2018-07-16 DIAGNOSIS — M6281 Muscle weakness (generalized): Secondary | ICD-10-CM | POA: Diagnosis not present

## 2018-07-16 DIAGNOSIS — Z8673 Personal history of transient ischemic attack (TIA), and cerebral infarction without residual deficits: Secondary | ICD-10-CM | POA: Diagnosis not present

## 2018-07-17 DIAGNOSIS — Z8673 Personal history of transient ischemic attack (TIA), and cerebral infarction without residual deficits: Secondary | ICD-10-CM | POA: Diagnosis not present

## 2018-07-17 DIAGNOSIS — M6281 Muscle weakness (generalized): Secondary | ICD-10-CM | POA: Diagnosis not present

## 2018-07-18 DIAGNOSIS — Z8673 Personal history of transient ischemic attack (TIA), and cerebral infarction without residual deficits: Secondary | ICD-10-CM | POA: Diagnosis not present

## 2018-07-18 DIAGNOSIS — M6281 Muscle weakness (generalized): Secondary | ICD-10-CM | POA: Diagnosis not present

## 2018-07-19 DIAGNOSIS — R69 Illness, unspecified: Secondary | ICD-10-CM | POA: Diagnosis not present

## 2018-07-19 DIAGNOSIS — F321 Major depressive disorder, single episode, moderate: Secondary | ICD-10-CM | POA: Diagnosis not present

## 2018-07-21 DIAGNOSIS — Z8673 Personal history of transient ischemic attack (TIA), and cerebral infarction without residual deficits: Secondary | ICD-10-CM | POA: Diagnosis not present

## 2018-07-21 DIAGNOSIS — M6281 Muscle weakness (generalized): Secondary | ICD-10-CM | POA: Diagnosis not present

## 2018-07-22 DIAGNOSIS — Z8673 Personal history of transient ischemic attack (TIA), and cerebral infarction without residual deficits: Secondary | ICD-10-CM | POA: Diagnosis not present

## 2018-07-22 DIAGNOSIS — M6281 Muscle weakness (generalized): Secondary | ICD-10-CM | POA: Diagnosis not present

## 2018-07-22 DIAGNOSIS — L97519 Non-pressure chronic ulcer of other part of right foot with unspecified severity: Secondary | ICD-10-CM | POA: Diagnosis not present

## 2018-07-22 DIAGNOSIS — R2689 Other abnormalities of gait and mobility: Secondary | ICD-10-CM | POA: Diagnosis not present

## 2018-07-23 DIAGNOSIS — Z8673 Personal history of transient ischemic attack (TIA), and cerebral infarction without residual deficits: Secondary | ICD-10-CM | POA: Diagnosis not present

## 2018-07-23 DIAGNOSIS — M6281 Muscle weakness (generalized): Secondary | ICD-10-CM | POA: Diagnosis not present

## 2018-07-24 DIAGNOSIS — M6281 Muscle weakness (generalized): Secondary | ICD-10-CM | POA: Diagnosis not present

## 2018-07-24 DIAGNOSIS — Z8673 Personal history of transient ischemic attack (TIA), and cerebral infarction without residual deficits: Secondary | ICD-10-CM | POA: Diagnosis not present

## 2018-07-25 DIAGNOSIS — M6281 Muscle weakness (generalized): Secondary | ICD-10-CM | POA: Diagnosis not present

## 2018-07-25 DIAGNOSIS — Z8673 Personal history of transient ischemic attack (TIA), and cerebral infarction without residual deficits: Secondary | ICD-10-CM | POA: Diagnosis not present

## 2018-07-28 DIAGNOSIS — Z8673 Personal history of transient ischemic attack (TIA), and cerebral infarction without residual deficits: Secondary | ICD-10-CM | POA: Diagnosis not present

## 2018-07-28 DIAGNOSIS — M6281 Muscle weakness (generalized): Secondary | ICD-10-CM | POA: Diagnosis not present

## 2018-07-29 DIAGNOSIS — M6281 Muscle weakness (generalized): Secondary | ICD-10-CM | POA: Diagnosis not present

## 2018-07-29 DIAGNOSIS — Z8673 Personal history of transient ischemic attack (TIA), and cerebral infarction without residual deficits: Secondary | ICD-10-CM | POA: Diagnosis not present

## 2018-07-30 DIAGNOSIS — Z8673 Personal history of transient ischemic attack (TIA), and cerebral infarction without residual deficits: Secondary | ICD-10-CM | POA: Diagnosis not present

## 2018-07-30 DIAGNOSIS — M6281 Muscle weakness (generalized): Secondary | ICD-10-CM | POA: Diagnosis not present

## 2018-07-31 DIAGNOSIS — M6281 Muscle weakness (generalized): Secondary | ICD-10-CM | POA: Diagnosis not present

## 2018-07-31 DIAGNOSIS — Z8673 Personal history of transient ischemic attack (TIA), and cerebral infarction without residual deficits: Secondary | ICD-10-CM | POA: Diagnosis not present

## 2018-07-31 DIAGNOSIS — L97519 Non-pressure chronic ulcer of other part of right foot with unspecified severity: Secondary | ICD-10-CM | POA: Diagnosis not present

## 2018-07-31 DIAGNOSIS — R2689 Other abnormalities of gait and mobility: Secondary | ICD-10-CM | POA: Diagnosis not present

## 2018-08-01 DIAGNOSIS — Z8673 Personal history of transient ischemic attack (TIA), and cerebral infarction without residual deficits: Secondary | ICD-10-CM | POA: Diagnosis not present

## 2018-08-01 DIAGNOSIS — M6281 Muscle weakness (generalized): Secondary | ICD-10-CM | POA: Diagnosis not present

## 2018-08-04 DIAGNOSIS — Z8673 Personal history of transient ischemic attack (TIA), and cerebral infarction without residual deficits: Secondary | ICD-10-CM | POA: Diagnosis not present

## 2018-08-04 DIAGNOSIS — M6281 Muscle weakness (generalized): Secondary | ICD-10-CM | POA: Diagnosis not present

## 2018-08-05 DIAGNOSIS — M6281 Muscle weakness (generalized): Secondary | ICD-10-CM | POA: Diagnosis not present

## 2018-08-05 DIAGNOSIS — Z8673 Personal history of transient ischemic attack (TIA), and cerebral infarction without residual deficits: Secondary | ICD-10-CM | POA: Diagnosis not present

## 2018-08-05 DIAGNOSIS — L97519 Non-pressure chronic ulcer of other part of right foot with unspecified severity: Secondary | ICD-10-CM | POA: Diagnosis not present

## 2018-08-05 DIAGNOSIS — R2689 Other abnormalities of gait and mobility: Secondary | ICD-10-CM | POA: Diagnosis not present

## 2018-08-06 DIAGNOSIS — M6281 Muscle weakness (generalized): Secondary | ICD-10-CM | POA: Diagnosis not present

## 2018-08-06 DIAGNOSIS — Z8673 Personal history of transient ischemic attack (TIA), and cerebral infarction without residual deficits: Secondary | ICD-10-CM | POA: Diagnosis not present

## 2018-08-07 DIAGNOSIS — Z8673 Personal history of transient ischemic attack (TIA), and cerebral infarction without residual deficits: Secondary | ICD-10-CM | POA: Diagnosis not present

## 2018-08-07 DIAGNOSIS — M6281 Muscle weakness (generalized): Secondary | ICD-10-CM | POA: Diagnosis not present

## 2018-08-08 ENCOUNTER — Ambulatory Visit (INDEPENDENT_AMBULATORY_CARE_PROVIDER_SITE_OTHER): Payer: Medicare HMO | Admitting: Nurse Practitioner

## 2018-08-08 ENCOUNTER — Encounter: Payer: Self-pay | Admitting: Vascular Surgery

## 2018-08-08 ENCOUNTER — Encounter (HOSPITAL_COMMUNITY): Payer: Medicare HMO

## 2018-08-08 ENCOUNTER — Encounter: Payer: Self-pay | Admitting: Nurse Practitioner

## 2018-08-08 VITALS — BP 157/80 | HR 57 | Temp 97.1°F | Ht 72.0 in | Wt 259.8 lb

## 2018-08-08 DIAGNOSIS — A0472 Enterocolitis due to Clostridium difficile, not specified as recurrent: Secondary | ICD-10-CM

## 2018-08-08 DIAGNOSIS — K219 Gastro-esophageal reflux disease without esophagitis: Secondary | ICD-10-CM

## 2018-08-08 DIAGNOSIS — K922 Gastrointestinal hemorrhage, unspecified: Secondary | ICD-10-CM | POA: Diagnosis not present

## 2018-08-08 NOTE — Progress Notes (Signed)
Referring Provider: [name], MD Primary Care Physician:  [name], MD Primary GI:  Dr. [place] Alar  Chief Complaint  Patient presents with  . Diarrhea    f/u.    HPI:   Eric Scott is a 68 y.o. male who presents for one-year follow-up and diarrhea.  Patient was last seen in our office 06/18/2017.  Noted history of C. difficile diarrhea, upper GI bleed, acute gastric erosion, duodenal ulcer.  Hospitalization in July 2018 for C. difficile and GI bleed with a hemoglobin low of 6.6.  EGD completed which found extensive duodenal ulcerations.  Required 2 units PRBCs.  Surveillance EGD completed October 2018 with ulcers all healed and was cleared to restart anticoagulation.  As last visit he was asymptomatic and labs just prior to his last visit found stable hemoglobin of 12.1 mildly low iron at 53.  No ferritin results included.  Recommended continue PPI, nursing facility to follow hemoglobin for any worsening anemia, notify us of any recurrent diarrhea or GI bleeding, otherwise follow-up 1 year.  Recent labs included with nursing home paperwork dated 06/05/2018 which found CMP essentially normal, CBC with mild anemia and hemoglobin 12.5 with normal indices, B12 normal, iron mildly low/normal at 56 magnesium normal, vitamin D normal.  Today he states he's doing well. No diarrhea, constipation, changes in bowel habits. Denies abdominal pain, N/V, fever, chills, unintentional weight loss. GERD symptoms well managed on current regimen ("I rarely have any heartburn problems). No acute weakness, fatigue, syncope. Denies chest pain, dyspnea, dizziness, lightheadedness, syncope, near syncope. Denies any other upper or lower GI symptoms.  Past Medical History:  Diagnosis Date  . Anxiety   . Chronic pain    legs, back; MRI 05/2012 with mild thoracic degenerative changes no spinal stenosis   . COPD (chronic obstructive pulmonary disease) ([place])   . Depression   . Diabetic peripheral neuropathy ([place])      "chronic" (05/27/2012)  . Diastolic CHF ([place])   . Hypercholesteremia   . Hypertension   . Peripheral edema   . PVD (peripheral vascular disease) ([place])   . Spinal stenosis    mild lumbar (MRI 05/2012)-L2-L3 to L4-L5 , mild lumbar foraminal stenosis   . Stroke ([place]) 05/2012   Subacute, lacunar infarcts within the left basal ganglia and posterior limp of the left internal capsule/thalamus; "RUE; both feet weak" (05/27/2012)  . Tremor   . Type II diabetes mellitus ([place])     Past Surgical History:  Procedure Laterality Date  . ANKLE SURGERY    . ESOPHAGOGASTRODUODENOSCOPY (EGD) WITH PROPOFOL N/A 12/21/2016   Procedure: ESOPHAGOGASTRODUODENOSCOPY (EGD) WITH PROPOFOL;  Surgeon: Daneil Dolin, MD;  [facility];  Service: Gastroenterology;  Laterality: N/A;  . ESOPHAGOGASTRODUODENOSCOPY (EGD) WITH PROPOFOL N/A 04/02/2017   Procedure: ESOPHAGOGASTRODUODENOSCOPY (EGD) WITH PROPOFOL;  Surgeon: Danie Binder, MD;  [facility];  Service: Endoscopy;  Laterality: N/A;  1030   . HERNIA REPAIR  01/05/2004   "belly button" (05/27/2012)    Current Outpatient Medications  Medication Sig Dispense Refill  . albuterol (PROVENTIL) (2.5 MG/3ML) 0.083% nebulizer solution Take 2.5 mg by nebulization every 3 (three) hours as needed for wheezing or shortness of breath.    . ALPRAZolam (XANAX) 1 MG tablet Take 1 tablet (1 mg total) by mouth 3 (three) times daily. (Patient taking differently: Take 0.5 mg by mouth every 8 (eight) hours. ) 10 tablet 0  . ammonium lactate (LAC-HYDRIN) 5 % LOTN lotion Apply 1 application topically 2 (  two) times daily.    Marland Kitchen aspirin EC 81 MG tablet Take 81 mg by mouth daily.    Marland Kitchen BREO ELLIPTA 100-25 MCG/INH AEPB Inhale 2 puffs into the lungs daily.     . calcium carbonate (TUMS - DOSED IN MG ELEMENTAL CALCIUM) 500 MG chewable tablet Chew 2 tablets by mouth 2 (two) times daily.    . cholecalciferol (VITAMIN D) 1000 units tablet Take 2,000 Units by mouth daily.      . cyanocobalamin 1000 MCG tablet Take 1,000 mcg by mouth daily.    . DULoxetine (CYMBALTA) 60 MG capsule Take 60 mg by mouth daily.    . fish oil-omega-3 fatty acids 1000 MG capsule Take 1 g by mouth 2 (two) times daily.    Marland Kitchen gabapentin (NEURONTIN) 100 MG capsule Take 100 mg by mouth at bedtime.    Marland Kitchen HUMALOG KWIKPEN 100 UNIT/ML KiwkPen Inject 2-14 Units into the skin 3 (three) times daily. Per sliding scale 150-200= 2 units 201-250= 4 units 251-300= 6 units 301-349= 8 units 350-400=10units 401-450=12units 451-500=14units ABOVE 501- CALL MD    . HYDROcodone-acetaminophen (NORCO/VICODIN) 5-325 MG tablet Take 1 tablet by mouth daily.    . insulin glargine (LANTUS) 100 UNIT/ML injection Inject 15 Units into the skin daily.    . magnesium oxide (MAG-OX) 400 MG tablet Take 400 mg by mouth daily.    . Multiple Vitamin (MULTIVITAMIN) tablet Take 1 tablet by mouth daily.    . pantoprazole (PROTONIX) 40 MG tablet Take 1 tablet (40 mg total) by mouth daily before breakfast. 30 tablet 11  . potassium chloride SA (K-DUR,KLOR-CON) 20 MEQ tablet Take 40 mEq by mouth daily.    . metoprolol tartrate (LOPRESSOR) 25 MG tablet Take 1 tablet (25 mg total) by mouth 2 (two) times daily. (Patient not taking: Reported on 08/08/2018) 60 tablet 0   No current facility-administered medications for this visit.     Allergies as of 08/08/2018 - Review Complete 08/08/2018  Allergen Reaction Noted  . Blueberry flavor Other (See Comments) 05/29/2016  . Cucumber extract Other (See Comments) 08/01/2012  . Flexeril [cyclobenzaprine hcl] Other (See Comments) 07/20/2010  . Kiwi extract Other (See Comments) 08/01/2012    Family History  Problem Relation Age of Onset  . Colon cancer Neg Hx     Social History   Socioeconomic History  . Marital status: Legally Separated    Spouse name: Not on file  . Number of children: Not on file  . Years of education: Not on file  . Highest education level: Not on file   Occupational History  . Not on file  Social Needs  . Financial resource strain: Not on file  . Food insecurity:    Worry: Not on file    Inability: Not on file  . Transportation needs:    Medical: Not on file    Non-medical: Not on file  Tobacco Use  . Smoking status: Current Every Day Smoker    Packs/day: 0.50    Years: 45.00    Pack years: 22.50    Types: Cigarettes    Last attempt to quit: 12/28/2014    Years since quitting: 3.6  . Smokeless tobacco: Never Used  Substance and Sexual Activity  . Alcohol use: No    Comment: 05/27/2012 "drank gallons and gallons 20 yr ago or so; last drink  at least 10 yr ago"  . Drug use: No  . Sexual activity: Never  Lifestyle  . Physical activity:    Days  per week: Not on file    Minutes per session: Not on file  . Stress: Not on file  Relationships  . Social connections:    Talks on phone: Not on file    Gets together: Not on file    Attends religious service: Not on file    Active member of club or organization: Not on file    Attends meetings of clubs or organizations: Not on file    Relationship status: Not on file  Other Topics Concern  . Not on file  Social History Narrative   Patient lives in [place] with a friend.   He is divorced. Has one kid.   Not working for about 7 years now. Used to do plumbing work.    Review of Systems: Complete ROS negative except as per HPI.   Physical Exam: BP (!) 157/80   Pulse (!) 57   Temp (!) 97.1 F (36.2 C) (Oral)   Ht 6' (1.829 m)   Wt 259 lb 12.8 oz (117.8 kg)   BMI 35.24 kg/m  General:   Alert and oriented. Pleasant and cooperative. Well-nourished and well-developed.  Eyes:  Without icterus, sclera clear and conjunctiva pink.  Ears:  Normal auditory acuity. Cardiovascular:  S1, S2 present without murmurs appreciated. Extremities without clubbing or edema. Respiratory:  Clear to auscultation bilaterally. No wheezes, rales, or rhonchi. No distress.  Gastrointestinal:   +BS, soft, non-tender and non-distended. No HSM noted. No guarding or rebound. No masses appreciated.  Rectal:  Deferred  Musculoskalatal:  Symmetrical without gross deformities. Neurologic:  Alert and oriented x4;  grossly normal neurologically. Psych:  Alert and cooperative. Normal mood and affect. Heme/Lymph/Immune: No excessive bruising noted.    08/08/2018 10:52 AM   Disclaimer: This note was dictated with voice recognition software. Similar sounding words can inadvertently be transcribed and may not be corrected upon review.

## 2018-08-08 NOTE — Assessment & Plan Note (Signed)
History of significant GI bleed in 2018 with a hemoglobin level of 6.6 on anticoagulation for DVT at that time as well as daily aspirin.  He remains on daily aspirin but is no longer on anticoagulation.  EGD found extensive duodenal ulcerations and peptic erosions and ulcers.  Surveillance EGD found healed ulcers and erosions after twice a day PPI for 3 months.  He remains on once daily PPI and I feel this is a good idea.  He is on magnesium supplementation and his recent magnesium level is normal.  Continue current medications.  Given it is been 2 years with no recurrent GI bleed I feel safe releasing him to as needed care.  He is to call us for follow-up for any noted GI bleed.

## 2018-08-08 NOTE — Assessment & Plan Note (Signed)
Chronic GERD symptoms in the setting of a history of upper GI bleed on NSAIDs with extensive duodenal ulcerations and peptic ulcer disease.  He has not had any recurrent bleeding for the past 2 years.  His symptoms are generally well managed on PPI.  Recommend he continue PPI given his history and the fact that he is still on aspirin.  Return for follow-up as needed.

## 2018-08-08 NOTE — Patient Instructions (Signed)
Your health issues we discussed today were:   History of colon infection: 1. But you have not had any more diarrhea in the past 2 years 2. Continue your current medications 3. Call us if you have any recurrent diarrhea  GERD (heartburn): 1. I am glad your symptoms are well controlled on your current medicines 2. Continue your current medications and let us know if you have any worsening heartburn  Previous GI bleed: 1. I am glad you are not seeing any more bleeding.  Your labs look stable which is great 2. Call us if you see any further bleeding in your stools either red blood or pitch black stools  Overall I recommend:  1. Return for follow-up as needed 2. Call if you have any questions or concerns.  At [facility] we value your feedback. You may receive a survey about your visit today. Please share your experience as we strive to create trusting relationships with our patients to provide genuine, compassionate, quality care.  We appreciate your understanding and patience as we review any laboratory studies, imaging, and other diagnostic tests that are ordered as we care for you. Our office policy is 5 business days for review of these results, and any emergent or urgent results are addressed in a timely manner for your best interest. If you do not hear from our office in 1 week, please contact us.   We also encourage the use of MyChart, which contains your medical information for your review as well. If you are not enrolled in this feature, an access code is on this after visit summary for your convenience. Thank you for allowing [place] to be involved in your care.  It was great to see you today!  I hope you have a great day!!

## 2018-08-08 NOTE — Assessment & Plan Note (Signed)
The patient was admitted in 2018 for C. difficile which she was treated with vancomycin.  Has not had recurrent symptoms for the past 2 years.  At this point given the longevity of his normal bowel movements it is safe to release him for PRN follow-up.  If he has recurrent diarrhea they can always notify us and we can follow-up.  Continue current medications otherwise

## 2018-08-09 DIAGNOSIS — M6281 Muscle weakness (generalized): Secondary | ICD-10-CM | POA: Diagnosis not present

## 2018-08-09 DIAGNOSIS — Z8673 Personal history of transient ischemic attack (TIA), and cerebral infarction without residual deficits: Secondary | ICD-10-CM | POA: Diagnosis not present

## 2018-08-09 DIAGNOSIS — E559 Vitamin D deficiency, unspecified: Secondary | ICD-10-CM | POA: Diagnosis not present

## 2018-08-09 DIAGNOSIS — D649 Anemia, unspecified: Secondary | ICD-10-CM | POA: Diagnosis not present

## 2018-08-09 DIAGNOSIS — N183 Chronic kidney disease, stage 3 (moderate): Secondary | ICD-10-CM | POA: Diagnosis not present

## 2018-08-11 DIAGNOSIS — Z8673 Personal history of transient ischemic attack (TIA), and cerebral infarction without residual deficits: Secondary | ICD-10-CM | POA: Diagnosis not present

## 2018-08-11 DIAGNOSIS — M6281 Muscle weakness (generalized): Secondary | ICD-10-CM | POA: Diagnosis not present

## 2018-08-11 NOTE — Progress Notes (Signed)
CC'D TO PCP °

## 2018-08-12 DIAGNOSIS — Z8673 Personal history of transient ischemic attack (TIA), and cerebral infarction without residual deficits: Secondary | ICD-10-CM | POA: Diagnosis not present

## 2018-08-12 DIAGNOSIS — E876 Hypokalemia: Secondary | ICD-10-CM | POA: Diagnosis not present

## 2018-08-12 DIAGNOSIS — M6281 Muscle weakness (generalized): Secondary | ICD-10-CM | POA: Diagnosis not present

## 2018-08-12 DIAGNOSIS — I10 Essential (primary) hypertension: Secondary | ICD-10-CM | POA: Diagnosis not present

## 2018-08-12 DIAGNOSIS — N179 Acute kidney failure, unspecified: Secondary | ICD-10-CM | POA: Diagnosis not present

## 2018-08-12 DIAGNOSIS — E1129 Type 2 diabetes mellitus with other diabetic kidney complication: Secondary | ICD-10-CM | POA: Diagnosis not present

## 2018-08-13 DIAGNOSIS — M6281 Muscle weakness (generalized): Secondary | ICD-10-CM | POA: Diagnosis not present

## 2018-08-13 DIAGNOSIS — Z8673 Personal history of transient ischemic attack (TIA), and cerebral infarction without residual deficits: Secondary | ICD-10-CM | POA: Diagnosis not present

## 2018-08-14 DIAGNOSIS — Z8673 Personal history of transient ischemic attack (TIA), and cerebral infarction without residual deficits: Secondary | ICD-10-CM | POA: Diagnosis not present

## 2018-08-14 DIAGNOSIS — M6281 Muscle weakness (generalized): Secondary | ICD-10-CM | POA: Diagnosis not present

## 2018-08-15 ENCOUNTER — Encounter: Payer: Self-pay | Admitting: Vascular Surgery

## 2018-08-15 ENCOUNTER — Other Ambulatory Visit: Payer: Self-pay

## 2018-08-15 ENCOUNTER — Ambulatory Visit (HOSPITAL_COMMUNITY)
Admission: RE | Admit: 2018-08-15 | Discharge: 2018-08-15 | Disposition: A | Discharge status: Home / Self Care | Payer: Medicare HMO | Source: Ambulatory Visit | Attending: Vascular Surgery | Admitting: Vascular Surgery

## 2018-08-15 ENCOUNTER — Ambulatory Visit (INDEPENDENT_AMBULATORY_CARE_PROVIDER_SITE_OTHER): Payer: Medicare HMO | Admitting: Vascular Surgery

## 2018-08-15 VITALS — BP 155/78 | HR 63 | Temp 97.4°F | Resp 20 | Ht 72.0 in | Wt 259.8 lb

## 2018-08-15 DIAGNOSIS — I872 Venous insufficiency (chronic) (peripheral): Secondary | ICD-10-CM | POA: Insufficient documentation

## 2018-08-15 DIAGNOSIS — M6281 Muscle weakness (generalized): Secondary | ICD-10-CM | POA: Diagnosis not present

## 2018-08-15 DIAGNOSIS — Z8673 Personal history of transient ischemic attack (TIA), and cerebral infarction without residual deficits: Secondary | ICD-10-CM | POA: Diagnosis not present

## 2018-08-15 NOTE — Progress Notes (Signed)
Patient ID: Eric Scott, male   DOB: 03/16/1951, 68 y.o.   MRN: [id number]  Reason for Consult: Venous Insufficiency (Rt leg swelling, venous stasis.  Neg DVT.  [facility] [place])   Referred by [name], MD  Subjective:     HPI:  Eric Scott is a 68 y.o. male history of tremor and stroke and is wheelchair-bound.  States he cannot stand or walk.  Has bilateral lower extremity right greater than left swelling.  Does not know the timeframe of this.  Does have some dependent rubor of his legs does not have any ulcerations has not had fevers or chills.  Denies any history of DVT or trauma to his lower extremities.  Has not had abdominal or pelvic surgery.  Venous reflux testing of his right lower extremity performed prior to today's visit.  Past Medical History:  Diagnosis Date  . Anxiety   . Chronic pain    legs, back; MRI 05/2012 with mild thoracic degenerative changes no spinal stenosis   . COPD (chronic obstructive pulmonary disease) ([place])   . Depression   . Diabetic peripheral neuropathy ([place])    "chronic" (05/27/2012)  . Diastolic CHF ([place])   . Hypercholesteremia   . Hypertension   . Peripheral edema   . PVD (peripheral vascular disease) ([place])   . Spinal stenosis    mild lumbar (MRI 05/2012)-L2-L3 to L4-L5 , mild lumbar foraminal stenosis   . Stroke ([place]) 05/2012   Subacute, lacunar infarcts within the left basal ganglia and posterior limp of the left internal capsule/thalamus; "RUE; both feet weak" (05/27/2012)  . Tremor   . Type II diabetes mellitus ([name])    Family History  Problem Relation Age of Onset  . Colon cancer Neg Hx    Past Surgical History:  Procedure Laterality Date  . ANKLE SURGERY    . ESOPHAGOGASTRODUODENOSCOPY (EGD) WITH PROPOFOL N/A 12/21/2016   Procedure: ESOPHAGOGASTRODUODENOSCOPY (EGD) WITH PROPOFOL;  Surgeon: Daneil Dolin, MD;  [facility];  Service: Gastroenterology;  Laterality: N/A;  .  ESOPHAGOGASTRODUODENOSCOPY (EGD) WITH PROPOFOL N/A 04/02/2017   Procedure: ESOPHAGOGASTRODUODENOSCOPY (EGD) WITH PROPOFOL;  Surgeon: Danie Binder, MD;  [facility];  Service: Endoscopy;  Laterality: N/A;  1030   . HERNIA REPAIR  01/05/2004   "belly button" (05/27/2012)    Short Social History:  Social History   Tobacco Use  . Smoking status: Current Every Day Smoker    Packs/day: 0.50    Years: 45.00    Pack years: 22.50    Types: Cigarettes    Last attempt to quit: 12/28/2014    Years since quitting: 3.6  . Smokeless tobacco: Never Used  . Tobacco comment: 1/2 pk per day  Substance Use Topics  . Alcohol use: No    Comment: 05/27/2012 "drank gallons and gallons 20 yr ago or so; last drink  at least 10 yr ago"    Allergies  Allergen Reactions  . Blueberry Flavor Other (See Comments)    Unknown  . Cucumber Extract Other (See Comments)    "Fells like I'm having a heart attack"  . Flexeril [Cyclobenzaprine Hcl] Other (See Comments)    "whole body  Tremors" (05/27/2012)  . Kiwi Extract Other (See Comments)    "feels like I'm having a heart attack"    Current Outpatient Medications  Medication Sig Dispense Refill  . albuterol (PROVENTIL) (2.5 MG/3ML) 0.083% nebulizer solution Take 2.5 mg by nebulization every 3 (three) hours as needed for  wheezing or shortness of breath.    . ALPRAZolam (XANAX) 1 MG tablet Take 1 tablet (1 mg total) by mouth 3 (three) times daily. (Patient taking differently: Take 0.5 mg by mouth every 8 (eight) hours. ) 10 tablet 0  . ammonium lactate (LAC-HYDRIN) 5 % LOTN lotion Apply 1 application topically 2 (two) times daily.    Marland Kitchen aspirin EC 81 MG tablet Take 81 mg by mouth daily.    Marland Kitchen BREO ELLIPTA 100-25 MCG/INH AEPB Inhale 2 puffs into the lungs daily.     . calcium carbonate (TUMS - DOSED IN MG ELEMENTAL CALCIUM) 500 MG chewable tablet Chew 2 tablets by mouth 2 (two) times daily.    . cholecalciferol (VITAMIN D) 1000 units tablet Take 2,000  Units by mouth daily.     . cyanocobalamin 1000 MCG tablet Take 1,000 mcg by mouth daily.    . DULoxetine (CYMBALTA) 60 MG capsule Take 60 mg by mouth daily.    . fish oil-omega-3 fatty acids 1000 MG capsule Take 1 g by mouth 2 (two) times daily.    Marland Kitchen gabapentin (NEURONTIN) 100 MG capsule Take 100 mg by mouth at bedtime.    Marland Kitchen HUMALOG KWIKPEN 100 UNIT/ML KiwkPen Inject 2-14 Units into the skin 3 (three) times daily. Per sliding scale 150-200= 2 units 201-250= 4 units 251-300= 6 units 301-349= 8 units 350-400=10units 401-450=12units 451-500=14units ABOVE 501- CALL MD    . HYDROcodone-acetaminophen (NORCO/VICODIN) 5-325 MG tablet Take 1 tablet by mouth daily.    . insulin glargine (LANTUS) 100 UNIT/ML injection Inject 15 Units into the skin daily.    . magnesium oxide (MAG-OX) 400 MG tablet Take 400 mg by mouth daily.    . Multiple Vitamin (MULTIVITAMIN) tablet Take 1 tablet by mouth daily.    . potassium chloride SA (K-DUR,KLOR-CON) 20 MEQ tablet Take 40 mEq by mouth daily.    . metoprolol tartrate (LOPRESSOR) 25 MG tablet Take 1 tablet (25 mg total) by mouth 2 (two) times daily. (Patient not taking: Reported on 08/08/2018) 60 tablet 0  . pantoprazole (PROTONIX) 40 MG tablet Take 1 tablet (40 mg total) by mouth daily before breakfast. 30 tablet 11   No current facility-administered medications for this visit.     Review of Systems  Constitutional:  Constitutional negative. HENT: HENT negative.  Eyes: Eyes negative.  Respiratory: Respiratory negative.  Cardiovascular: Positive for leg swelling.  GI: Gastrointestinal negative.  Musculoskeletal: Positive for leg pain.  Skin: Positive for rash.  Neurological: Neurological negative. Hematologic: Hematologic/lymphatic negative.  Psychiatric: Positive for depressed mood.        Objective:  Objective   Vitals:   08/15/18 1414  BP: (!) 155/78  Pulse: 63  Resp: 20  Temp: (!) 97.4 F (36.3 C)  TempSrc: Oral  SpO2: 92%  Weight:  259 lb 12.8 oz (117.8 kg)  Height: 6' (1.829 m)   Body mass index is 35.24 kg/m.  Physical Exam HENT:     Head: Normocephalic.  Eyes:     Pupils: Pupils are equal, round, and reactive to light.  Cardiovascular:     Rate and Rhythm: Regular rhythm.     Pulses:          Radial pulses are 2+ on the right side and 2+ on the left side.       Dorsalis pedis pulses are 2+ on the right side and 2+ on the left side.  Pulmonary:     Effort: Pulmonary effort is normal.  Abdominal:  General: Abdomen is flat.  Musculoskeletal:        General: No deformity or signs of injury.     Right lower leg: Edema present.     Left lower leg: Edema present.  Skin:    General: Skin is warm and dry.     Capillary Refill: Capillary refill takes less than 2 seconds.  Neurological:     General: No focal deficit present.     Mental Status: He is alert.  Psychiatric:        Mood and Affect: Mood normal.        Thought Content: Thought content normal.        Judgment: Judgment normal.     Data: I have independently interpreted his right lower extremity venous reflux study which demonstrates reflux of the 3565 ms with size 0.1 cm at the junction, 0.36 cm at the knee.     Assessment/Plan:     68 year old male presents for bilateral lower extremity right greater than left swelling.  He does have reflux in his right greater saphenous vein but it is quite diminutive doubtful this is much of a contributing factor.  I recommended leg elevation as he is mostly wheelchair-bound as well as compression stockings that should be put on prior to him getting out of bed daily.  I discussed this with patient as well as nursing home representative with him.  They demonstrate good understanding he can follow-up on an as-needed basis.     Waynetta Sandy MD [facility]

## 2018-08-18 DIAGNOSIS — Z8673 Personal history of transient ischemic attack (TIA), and cerebral infarction without residual deficits: Secondary | ICD-10-CM | POA: Diagnosis not present

## 2018-08-18 DIAGNOSIS — M6281 Muscle weakness (generalized): Secondary | ICD-10-CM | POA: Diagnosis not present

## 2018-08-19 DIAGNOSIS — Z8673 Personal history of transient ischemic attack (TIA), and cerebral infarction without residual deficits: Secondary | ICD-10-CM | POA: Diagnosis not present

## 2018-08-19 DIAGNOSIS — M6281 Muscle weakness (generalized): Secondary | ICD-10-CM | POA: Diagnosis not present

## 2018-08-20 DIAGNOSIS — M6281 Muscle weakness (generalized): Secondary | ICD-10-CM | POA: Diagnosis not present

## 2018-08-20 DIAGNOSIS — Z8673 Personal history of transient ischemic attack (TIA), and cerebral infarction without residual deficits: Secondary | ICD-10-CM | POA: Diagnosis not present

## 2018-08-21 DIAGNOSIS — I5032 Chronic diastolic (congestive) heart failure: Secondary | ICD-10-CM | POA: Diagnosis not present

## 2018-08-21 DIAGNOSIS — E1122 Type 2 diabetes mellitus with diabetic chronic kidney disease: Secondary | ICD-10-CM | POA: Diagnosis not present

## 2018-08-21 DIAGNOSIS — M6281 Muscle weakness (generalized): Secondary | ICD-10-CM | POA: Diagnosis not present

## 2018-08-21 DIAGNOSIS — Z8673 Personal history of transient ischemic attack (TIA), and cerebral infarction without residual deficits: Secondary | ICD-10-CM | POA: Diagnosis not present

## 2018-08-21 DIAGNOSIS — N183 Chronic kidney disease, stage 3 (moderate): Secondary | ICD-10-CM | POA: Diagnosis not present

## 2018-08-21 DIAGNOSIS — I130 Hypertensive heart and chronic kidney disease with heart failure and stage 1 through stage 4 chronic kidney disease, or unspecified chronic kidney disease: Secondary | ICD-10-CM | POA: Diagnosis not present

## 2018-08-22 DIAGNOSIS — Z8673 Personal history of transient ischemic attack (TIA), and cerebral infarction without residual deficits: Secondary | ICD-10-CM | POA: Diagnosis not present

## 2018-08-22 DIAGNOSIS — M6281 Muscle weakness (generalized): Secondary | ICD-10-CM | POA: Diagnosis not present

## 2018-08-22 DIAGNOSIS — F411 Generalized anxiety disorder: Secondary | ICD-10-CM | POA: Diagnosis not present

## 2018-08-22 DIAGNOSIS — R69 Illness, unspecified: Secondary | ICD-10-CM | POA: Diagnosis not present

## 2018-08-25 DIAGNOSIS — Z8673 Personal history of transient ischemic attack (TIA), and cerebral infarction without residual deficits: Secondary | ICD-10-CM | POA: Diagnosis not present

## 2018-08-25 DIAGNOSIS — M6281 Muscle weakness (generalized): Secondary | ICD-10-CM | POA: Diagnosis not present

## 2018-08-26 DIAGNOSIS — N183 Chronic kidney disease, stage 3 (moderate): Secondary | ICD-10-CM | POA: Diagnosis not present

## 2018-08-26 DIAGNOSIS — I5032 Chronic diastolic (congestive) heart failure: Secondary | ICD-10-CM | POA: Diagnosis not present

## 2018-08-26 DIAGNOSIS — M6281 Muscle weakness (generalized): Secondary | ICD-10-CM | POA: Diagnosis not present

## 2018-08-26 DIAGNOSIS — D649 Anemia, unspecified: Secondary | ICD-10-CM | POA: Diagnosis not present

## 2018-08-26 DIAGNOSIS — Z8673 Personal history of transient ischemic attack (TIA), and cerebral infarction without residual deficits: Secondary | ICD-10-CM | POA: Diagnosis not present

## 2018-08-28 DIAGNOSIS — N183 Chronic kidney disease, stage 3 (moderate): Secondary | ICD-10-CM | POA: Diagnosis not present

## 2018-08-28 DIAGNOSIS — M6281 Muscle weakness (generalized): Secondary | ICD-10-CM | POA: Diagnosis not present

## 2018-08-28 DIAGNOSIS — I5032 Chronic diastolic (congestive) heart failure: Secondary | ICD-10-CM | POA: Diagnosis not present

## 2018-08-28 DIAGNOSIS — I130 Hypertensive heart and chronic kidney disease with heart failure and stage 1 through stage 4 chronic kidney disease, or unspecified chronic kidney disease: Secondary | ICD-10-CM | POA: Diagnosis not present

## 2018-08-28 DIAGNOSIS — Z8673 Personal history of transient ischemic attack (TIA), and cerebral infarction without residual deficits: Secondary | ICD-10-CM | POA: Diagnosis not present

## 2018-08-29 DIAGNOSIS — Z8673 Personal history of transient ischemic attack (TIA), and cerebral infarction without residual deficits: Secondary | ICD-10-CM | POA: Diagnosis not present

## 2018-08-29 DIAGNOSIS — M6281 Muscle weakness (generalized): Secondary | ICD-10-CM | POA: Diagnosis not present

## 2018-09-01 DIAGNOSIS — I5032 Chronic diastolic (congestive) heart failure: Secondary | ICD-10-CM | POA: Diagnosis not present

## 2018-09-01 DIAGNOSIS — I739 Peripheral vascular disease, unspecified: Secondary | ICD-10-CM | POA: Diagnosis not present

## 2018-09-01 DIAGNOSIS — N184 Chronic kidney disease, stage 4 (severe): Secondary | ICD-10-CM | POA: Diagnosis not present

## 2018-09-01 DIAGNOSIS — I130 Hypertensive heart and chronic kidney disease with heart failure and stage 1 through stage 4 chronic kidney disease, or unspecified chronic kidney disease: Secondary | ICD-10-CM | POA: Diagnosis not present

## 2018-09-01 DIAGNOSIS — D649 Anemia, unspecified: Secondary | ICD-10-CM | POA: Diagnosis not present

## 2018-09-01 DIAGNOSIS — Z8673 Personal history of transient ischemic attack (TIA), and cerebral infarction without residual deficits: Secondary | ICD-10-CM | POA: Diagnosis not present

## 2018-09-01 DIAGNOSIS — M6281 Muscle weakness (generalized): Secondary | ICD-10-CM | POA: Diagnosis not present

## 2018-09-02 DIAGNOSIS — M6281 Muscle weakness (generalized): Secondary | ICD-10-CM | POA: Diagnosis not present

## 2018-09-02 DIAGNOSIS — Z8673 Personal history of transient ischemic attack (TIA), and cerebral infarction without residual deficits: Secondary | ICD-10-CM | POA: Diagnosis not present

## 2018-09-03 DIAGNOSIS — D649 Anemia, unspecified: Secondary | ICD-10-CM | POA: Diagnosis not present

## 2018-09-03 DIAGNOSIS — N184 Chronic kidney disease, stage 4 (severe): Secondary | ICD-10-CM | POA: Diagnosis not present

## 2018-09-04 DIAGNOSIS — I5032 Chronic diastolic (congestive) heart failure: Secondary | ICD-10-CM | POA: Diagnosis not present

## 2018-09-04 DIAGNOSIS — N183 Chronic kidney disease, stage 3 (moderate): Secondary | ICD-10-CM | POA: Diagnosis not present

## 2018-09-04 DIAGNOSIS — I739 Peripheral vascular disease, unspecified: Secondary | ICD-10-CM | POA: Diagnosis not present

## 2018-09-18 DIAGNOSIS — I739 Peripheral vascular disease, unspecified: Secondary | ICD-10-CM | POA: Diagnosis not present

## 2018-09-25 DIAGNOSIS — I130 Hypertensive heart and chronic kidney disease with heart failure and stage 1 through stage 4 chronic kidney disease, or unspecified chronic kidney disease: Secondary | ICD-10-CM | POA: Diagnosis not present

## 2018-09-26 DIAGNOSIS — R69 Illness, unspecified: Secondary | ICD-10-CM | POA: Diagnosis not present

## 2018-09-26 DIAGNOSIS — F411 Generalized anxiety disorder: Secondary | ICD-10-CM | POA: Diagnosis not present

## 2018-10-02 DIAGNOSIS — D649 Anemia, unspecified: Secondary | ICD-10-CM | POA: Diagnosis not present

## 2018-10-02 DIAGNOSIS — N183 Chronic kidney disease, stage 3 (moderate): Secondary | ICD-10-CM | POA: Diagnosis not present

## 2018-10-02 DIAGNOSIS — I5032 Chronic diastolic (congestive) heart failure: Secondary | ICD-10-CM | POA: Diagnosis not present

## 2018-10-02 DIAGNOSIS — I130 Hypertensive heart and chronic kidney disease with heart failure and stage 1 through stage 4 chronic kidney disease, or unspecified chronic kidney disease: Secondary | ICD-10-CM | POA: Diagnosis not present

## 2018-10-02 DIAGNOSIS — M549 Dorsalgia, unspecified: Secondary | ICD-10-CM | POA: Diagnosis not present

## 2018-10-02 DIAGNOSIS — N184 Chronic kidney disease, stage 4 (severe): Secondary | ICD-10-CM | POA: Diagnosis not present

## 2018-10-03 DIAGNOSIS — M6281 Muscle weakness (generalized): Secondary | ICD-10-CM | POA: Diagnosis not present

## 2018-10-03 DIAGNOSIS — Z8673 Personal history of transient ischemic attack (TIA), and cerebral infarction without residual deficits: Secondary | ICD-10-CM | POA: Diagnosis not present

## 2018-10-06 DIAGNOSIS — M6281 Muscle weakness (generalized): Secondary | ICD-10-CM | POA: Diagnosis not present

## 2018-10-06 DIAGNOSIS — Z8673 Personal history of transient ischemic attack (TIA), and cerebral infarction without residual deficits: Secondary | ICD-10-CM | POA: Diagnosis not present

## 2018-10-07 DIAGNOSIS — Z8673 Personal history of transient ischemic attack (TIA), and cerebral infarction without residual deficits: Secondary | ICD-10-CM | POA: Diagnosis not present

## 2018-10-07 DIAGNOSIS — N184 Chronic kidney disease, stage 4 (severe): Secondary | ICD-10-CM | POA: Diagnosis not present

## 2018-10-07 DIAGNOSIS — M6281 Muscle weakness (generalized): Secondary | ICD-10-CM | POA: Diagnosis not present

## 2018-10-07 DIAGNOSIS — D649 Anemia, unspecified: Secondary | ICD-10-CM | POA: Diagnosis not present

## 2018-10-07 DIAGNOSIS — I10 Essential (primary) hypertension: Secondary | ICD-10-CM | POA: Diagnosis not present

## 2018-10-08 DIAGNOSIS — R69 Illness, unspecified: Secondary | ICD-10-CM | POA: Diagnosis not present

## 2018-10-08 DIAGNOSIS — I10 Essential (primary) hypertension: Secondary | ICD-10-CM | POA: Diagnosis not present

## 2018-10-08 DIAGNOSIS — I5032 Chronic diastolic (congestive) heart failure: Secondary | ICD-10-CM | POA: Diagnosis not present

## 2018-10-08 DIAGNOSIS — M6281 Muscle weakness (generalized): Secondary | ICD-10-CM | POA: Diagnosis not present

## 2018-10-08 DIAGNOSIS — I739 Peripheral vascular disease, unspecified: Secondary | ICD-10-CM | POA: Diagnosis not present

## 2018-10-08 DIAGNOSIS — Z8673 Personal history of transient ischemic attack (TIA), and cerebral infarction without residual deficits: Secondary | ICD-10-CM | POA: Diagnosis not present

## 2018-10-09 DIAGNOSIS — I130 Hypertensive heart and chronic kidney disease with heart failure and stage 1 through stage 4 chronic kidney disease, or unspecified chronic kidney disease: Secondary | ICD-10-CM | POA: Diagnosis not present

## 2018-10-09 DIAGNOSIS — M6281 Muscle weakness (generalized): Secondary | ICD-10-CM | POA: Diagnosis not present

## 2018-10-09 DIAGNOSIS — Z8673 Personal history of transient ischemic attack (TIA), and cerebral infarction without residual deficits: Secondary | ICD-10-CM | POA: Diagnosis not present

## 2018-10-09 DIAGNOSIS — M549 Dorsalgia, unspecified: Secondary | ICD-10-CM | POA: Diagnosis not present

## 2018-10-09 DIAGNOSIS — N183 Chronic kidney disease, stage 3 (moderate): Secondary | ICD-10-CM | POA: Diagnosis not present

## 2018-10-10 DIAGNOSIS — M6281 Muscle weakness (generalized): Secondary | ICD-10-CM | POA: Diagnosis not present

## 2018-10-10 DIAGNOSIS — Z8673 Personal history of transient ischemic attack (TIA), and cerebral infarction without residual deficits: Secondary | ICD-10-CM | POA: Diagnosis not present

## 2018-10-13 DIAGNOSIS — M6281 Muscle weakness (generalized): Secondary | ICD-10-CM | POA: Diagnosis not present

## 2018-10-13 DIAGNOSIS — Z8673 Personal history of transient ischemic attack (TIA), and cerebral infarction without residual deficits: Secondary | ICD-10-CM | POA: Diagnosis not present

## 2018-10-14 DIAGNOSIS — M6281 Muscle weakness (generalized): Secondary | ICD-10-CM | POA: Diagnosis not present

## 2018-10-14 DIAGNOSIS — D649 Anemia, unspecified: Secondary | ICD-10-CM | POA: Diagnosis not present

## 2018-10-14 DIAGNOSIS — I5032 Chronic diastolic (congestive) heart failure: Secondary | ICD-10-CM | POA: Diagnosis not present

## 2018-10-14 DIAGNOSIS — Z8673 Personal history of transient ischemic attack (TIA), and cerebral infarction without residual deficits: Secondary | ICD-10-CM | POA: Diagnosis not present

## 2018-10-14 DIAGNOSIS — R7989 Other specified abnormal findings of blood chemistry: Secondary | ICD-10-CM | POA: Diagnosis not present

## 2018-10-15 DIAGNOSIS — M6281 Muscle weakness (generalized): Secondary | ICD-10-CM | POA: Diagnosis not present

## 2018-10-15 DIAGNOSIS — Z8673 Personal history of transient ischemic attack (TIA), and cerebral infarction without residual deficits: Secondary | ICD-10-CM | POA: Diagnosis not present

## 2018-10-16 DIAGNOSIS — I5032 Chronic diastolic (congestive) heart failure: Secondary | ICD-10-CM | POA: Diagnosis not present

## 2018-10-16 DIAGNOSIS — I739 Peripheral vascular disease, unspecified: Secondary | ICD-10-CM | POA: Diagnosis not present

## 2018-10-16 DIAGNOSIS — M6281 Muscle weakness (generalized): Secondary | ICD-10-CM | POA: Diagnosis not present

## 2018-10-16 DIAGNOSIS — Z8673 Personal history of transient ischemic attack (TIA), and cerebral infarction without residual deficits: Secondary | ICD-10-CM | POA: Diagnosis not present

## 2018-10-16 DIAGNOSIS — I130 Hypertensive heart and chronic kidney disease with heart failure and stage 1 through stage 4 chronic kidney disease, or unspecified chronic kidney disease: Secondary | ICD-10-CM | POA: Diagnosis not present

## 2018-10-16 DIAGNOSIS — N183 Chronic kidney disease, stage 3 (moderate): Secondary | ICD-10-CM | POA: Diagnosis not present

## 2018-10-17 DIAGNOSIS — Z8673 Personal history of transient ischemic attack (TIA), and cerebral infarction without residual deficits: Secondary | ICD-10-CM | POA: Diagnosis not present

## 2018-10-17 DIAGNOSIS — M6281 Muscle weakness (generalized): Secondary | ICD-10-CM | POA: Diagnosis not present

## 2018-10-20 DIAGNOSIS — M6281 Muscle weakness (generalized): Secondary | ICD-10-CM | POA: Diagnosis not present

## 2018-10-20 DIAGNOSIS — Z8673 Personal history of transient ischemic attack (TIA), and cerebral infarction without residual deficits: Secondary | ICD-10-CM | POA: Diagnosis not present

## 2018-10-21 DIAGNOSIS — R7989 Other specified abnormal findings of blood chemistry: Secondary | ICD-10-CM | POA: Diagnosis not present

## 2018-10-21 DIAGNOSIS — I10 Essential (primary) hypertension: Secondary | ICD-10-CM | POA: Diagnosis not present

## 2018-10-21 DIAGNOSIS — M6281 Muscle weakness (generalized): Secondary | ICD-10-CM | POA: Diagnosis not present

## 2018-10-21 DIAGNOSIS — D649 Anemia, unspecified: Secondary | ICD-10-CM | POA: Diagnosis not present

## 2018-10-21 DIAGNOSIS — Z8673 Personal history of transient ischemic attack (TIA), and cerebral infarction without residual deficits: Secondary | ICD-10-CM | POA: Diagnosis not present

## 2018-10-22 DIAGNOSIS — Z8673 Personal history of transient ischemic attack (TIA), and cerebral infarction without residual deficits: Secondary | ICD-10-CM | POA: Diagnosis not present

## 2018-10-22 DIAGNOSIS — M6281 Muscle weakness (generalized): Secondary | ICD-10-CM | POA: Diagnosis not present

## 2018-10-23 DIAGNOSIS — Z8673 Personal history of transient ischemic attack (TIA), and cerebral infarction without residual deficits: Secondary | ICD-10-CM | POA: Diagnosis not present

## 2018-10-23 DIAGNOSIS — M6281 Muscle weakness (generalized): Secondary | ICD-10-CM | POA: Diagnosis not present

## 2018-10-23 DIAGNOSIS — N183 Chronic kidney disease, stage 3 (moderate): Secondary | ICD-10-CM | POA: Diagnosis not present

## 2018-10-23 DIAGNOSIS — I130 Hypertensive heart and chronic kidney disease with heart failure and stage 1 through stage 4 chronic kidney disease, or unspecified chronic kidney disease: Secondary | ICD-10-CM | POA: Diagnosis not present

## 2018-10-24 DIAGNOSIS — Z8673 Personal history of transient ischemic attack (TIA), and cerebral infarction without residual deficits: Secondary | ICD-10-CM | POA: Diagnosis not present

## 2018-10-24 DIAGNOSIS — M6281 Muscle weakness (generalized): Secondary | ICD-10-CM | POA: Diagnosis not present

## 2018-10-27 DIAGNOSIS — Z8673 Personal history of transient ischemic attack (TIA), and cerebral infarction without residual deficits: Secondary | ICD-10-CM | POA: Diagnosis not present

## 2018-10-27 DIAGNOSIS — M6281 Muscle weakness (generalized): Secondary | ICD-10-CM | POA: Diagnosis not present

## 2018-10-28 DIAGNOSIS — M6281 Muscle weakness (generalized): Secondary | ICD-10-CM | POA: Diagnosis not present

## 2018-10-28 DIAGNOSIS — Z8673 Personal history of transient ischemic attack (TIA), and cerebral infarction without residual deficits: Secondary | ICD-10-CM | POA: Diagnosis not present

## 2018-10-29 DIAGNOSIS — M6281 Muscle weakness (generalized): Secondary | ICD-10-CM | POA: Diagnosis not present

## 2018-10-29 DIAGNOSIS — Z8673 Personal history of transient ischemic attack (TIA), and cerebral infarction without residual deficits: Secondary | ICD-10-CM | POA: Diagnosis not present

## 2018-10-30 DIAGNOSIS — M6281 Muscle weakness (generalized): Secondary | ICD-10-CM | POA: Diagnosis not present

## 2018-10-30 DIAGNOSIS — Z8673 Personal history of transient ischemic attack (TIA), and cerebral infarction without residual deficits: Secondary | ICD-10-CM | POA: Diagnosis not present

## 2018-11-03 DIAGNOSIS — I130 Hypertensive heart and chronic kidney disease with heart failure and stage 1 through stage 4 chronic kidney disease, or unspecified chronic kidney disease: Secondary | ICD-10-CM | POA: Diagnosis not present

## 2018-11-03 DIAGNOSIS — N183 Chronic kidney disease, stage 3 (moderate): Secondary | ICD-10-CM | POA: Diagnosis not present

## 2018-11-03 DIAGNOSIS — I5032 Chronic diastolic (congestive) heart failure: Secondary | ICD-10-CM | POA: Diagnosis not present

## 2018-11-05 DIAGNOSIS — D649 Anemia, unspecified: Secondary | ICD-10-CM | POA: Diagnosis not present

## 2018-11-05 DIAGNOSIS — I10 Essential (primary) hypertension: Secondary | ICD-10-CM | POA: Diagnosis not present

## 2018-11-06 DIAGNOSIS — I130 Hypertensive heart and chronic kidney disease with heart failure and stage 1 through stage 4 chronic kidney disease, or unspecified chronic kidney disease: Secondary | ICD-10-CM | POA: Diagnosis not present

## 2018-11-06 DIAGNOSIS — M549 Dorsalgia, unspecified: Secondary | ICD-10-CM | POA: Diagnosis not present

## 2018-11-06 DIAGNOSIS — N183 Chronic kidney disease, stage 3 (moderate): Secondary | ICD-10-CM | POA: Diagnosis not present

## 2018-11-06 DIAGNOSIS — I5032 Chronic diastolic (congestive) heart failure: Secondary | ICD-10-CM | POA: Diagnosis not present

## 2018-11-13 DIAGNOSIS — I130 Hypertensive heart and chronic kidney disease with heart failure and stage 1 through stage 4 chronic kidney disease, or unspecified chronic kidney disease: Secondary | ICD-10-CM | POA: Diagnosis not present

## 2018-11-13 DIAGNOSIS — I5032 Chronic diastolic (congestive) heart failure: Secondary | ICD-10-CM | POA: Diagnosis not present

## 2018-11-13 DIAGNOSIS — Z716 Tobacco abuse counseling: Secondary | ICD-10-CM | POA: Diagnosis not present

## 2018-11-21 DIAGNOSIS — D649 Anemia, unspecified: Secondary | ICD-10-CM | POA: Diagnosis not present

## 2018-11-21 DIAGNOSIS — N184 Chronic kidney disease, stage 4 (severe): Secondary | ICD-10-CM | POA: Diagnosis not present

## 2018-11-24 DIAGNOSIS — I130 Hypertensive heart and chronic kidney disease with heart failure and stage 1 through stage 4 chronic kidney disease, or unspecified chronic kidney disease: Secondary | ICD-10-CM | POA: Diagnosis not present

## 2018-11-24 DIAGNOSIS — I5032 Chronic diastolic (congestive) heart failure: Secondary | ICD-10-CM | POA: Diagnosis not present

## 2018-11-24 DIAGNOSIS — N183 Chronic kidney disease, stage 3 (moderate): Secondary | ICD-10-CM | POA: Diagnosis not present

## 2018-12-01 DIAGNOSIS — D649 Anemia, unspecified: Secondary | ICD-10-CM | POA: Diagnosis not present

## 2018-12-01 DIAGNOSIS — N184 Chronic kidney disease, stage 4 (severe): Secondary | ICD-10-CM | POA: Diagnosis not present

## 2018-12-11 DIAGNOSIS — E1122 Type 2 diabetes mellitus with diabetic chronic kidney disease: Secondary | ICD-10-CM | POA: Diagnosis not present

## 2018-12-11 DIAGNOSIS — I130 Hypertensive heart and chronic kidney disease with heart failure and stage 1 through stage 4 chronic kidney disease, or unspecified chronic kidney disease: Secondary | ICD-10-CM | POA: Diagnosis not present

## 2018-12-11 DIAGNOSIS — I5032 Chronic diastolic (congestive) heart failure: Secondary | ICD-10-CM | POA: Diagnosis not present

## 2018-12-11 DIAGNOSIS — N183 Chronic kidney disease, stage 3 (moderate): Secondary | ICD-10-CM | POA: Diagnosis not present

## 2018-12-12 DIAGNOSIS — R69 Illness, unspecified: Secondary | ICD-10-CM | POA: Diagnosis not present

## 2018-12-12 DIAGNOSIS — F411 Generalized anxiety disorder: Secondary | ICD-10-CM | POA: Diagnosis not present

## 2018-12-15 DIAGNOSIS — I130 Hypertensive heart and chronic kidney disease with heart failure and stage 1 through stage 4 chronic kidney disease, or unspecified chronic kidney disease: Secondary | ICD-10-CM | POA: Diagnosis not present

## 2018-12-15 DIAGNOSIS — E119 Type 2 diabetes mellitus without complications: Secondary | ICD-10-CM | POA: Diagnosis not present

## 2018-12-15 DIAGNOSIS — I5032 Chronic diastolic (congestive) heart failure: Secondary | ICD-10-CM | POA: Diagnosis not present

## 2018-12-15 DIAGNOSIS — N183 Chronic kidney disease, stage 3 (moderate): Secondary | ICD-10-CM | POA: Diagnosis not present

## 2018-12-15 DIAGNOSIS — D649 Anemia, unspecified: Secondary | ICD-10-CM | POA: Diagnosis not present

## 2018-12-15 DIAGNOSIS — I10 Essential (primary) hypertension: Secondary | ICD-10-CM | POA: Diagnosis not present

## 2018-12-15 DIAGNOSIS — R0602 Shortness of breath: Secondary | ICD-10-CM | POA: Diagnosis not present

## 2018-12-15 DIAGNOSIS — E559 Vitamin D deficiency, unspecified: Secondary | ICD-10-CM | POA: Diagnosis not present

## 2018-12-15 DIAGNOSIS — Z79899 Other long term (current) drug therapy: Secondary | ICD-10-CM | POA: Diagnosis not present

## 2018-12-18 DIAGNOSIS — N281 Cyst of kidney, acquired: Secondary | ICD-10-CM | POA: Diagnosis not present

## 2018-12-22 DIAGNOSIS — I5032 Chronic diastolic (congestive) heart failure: Secondary | ICD-10-CM | POA: Diagnosis not present

## 2018-12-22 DIAGNOSIS — Z79899 Other long term (current) drug therapy: Secondary | ICD-10-CM | POA: Diagnosis not present

## 2018-12-22 DIAGNOSIS — I130 Hypertensive heart and chronic kidney disease with heart failure and stage 1 through stage 4 chronic kidney disease, or unspecified chronic kidney disease: Secondary | ICD-10-CM | POA: Diagnosis not present

## 2018-12-22 DIAGNOSIS — I10 Essential (primary) hypertension: Secondary | ICD-10-CM | POA: Diagnosis not present

## 2018-12-22 DIAGNOSIS — R319 Hematuria, unspecified: Secondary | ICD-10-CM | POA: Diagnosis not present

## 2018-12-22 DIAGNOSIS — N390 Urinary tract infection, site not specified: Secondary | ICD-10-CM | POA: Diagnosis not present

## 2018-12-22 DIAGNOSIS — N183 Chronic kidney disease, stage 3 (moderate): Secondary | ICD-10-CM | POA: Diagnosis not present

## 2018-12-23 DIAGNOSIS — I5032 Chronic diastolic (congestive) heart failure: Secondary | ICD-10-CM | POA: Diagnosis not present

## 2018-12-23 DIAGNOSIS — R5383 Other fatigue: Secondary | ICD-10-CM | POA: Diagnosis not present

## 2018-12-23 DIAGNOSIS — R0602 Shortness of breath: Secondary | ICD-10-CM | POA: Diagnosis not present

## 2018-12-23 DIAGNOSIS — I87393 Chronic venous hypertension (idiopathic) with other complications of bilateral lower extremity: Secondary | ICD-10-CM | POA: Diagnosis not present

## 2018-12-24 DIAGNOSIS — I10 Essential (primary) hypertension: Secondary | ICD-10-CM | POA: Diagnosis not present

## 2018-12-25 DIAGNOSIS — I130 Hypertensive heart and chronic kidney disease with heart failure and stage 1 through stage 4 chronic kidney disease, or unspecified chronic kidney disease: Secondary | ICD-10-CM | POA: Diagnosis not present

## 2018-12-25 DIAGNOSIS — N183 Chronic kidney disease, stage 3 (moderate): Secondary | ICD-10-CM | POA: Diagnosis not present

## 2018-12-31 DIAGNOSIS — R0602 Shortness of breath: Secondary | ICD-10-CM | POA: Diagnosis not present

## 2018-12-31 DIAGNOSIS — N183 Chronic kidney disease, stage 3 (moderate): Secondary | ICD-10-CM | POA: Diagnosis not present

## 2018-12-31 DIAGNOSIS — I5022 Chronic systolic (congestive) heart failure: Secondary | ICD-10-CM | POA: Diagnosis not present

## 2018-12-31 DIAGNOSIS — D649 Anemia, unspecified: Secondary | ICD-10-CM | POA: Diagnosis not present

## 2018-12-31 DIAGNOSIS — I87393 Chronic venous hypertension (idiopathic) with other complications of bilateral lower extremity: Secondary | ICD-10-CM | POA: Diagnosis not present

## 2018-12-31 DIAGNOSIS — I10 Essential (primary) hypertension: Secondary | ICD-10-CM | POA: Diagnosis not present

## 2018-12-31 DIAGNOSIS — I5032 Chronic diastolic (congestive) heart failure: Secondary | ICD-10-CM | POA: Diagnosis not present

## 2019-01-04 DIAGNOSIS — I130 Hypertensive heart and chronic kidney disease with heart failure and stage 1 through stage 4 chronic kidney disease, or unspecified chronic kidney disease: Secondary | ICD-10-CM | POA: Diagnosis not present

## 2019-01-04 DIAGNOSIS — N183 Chronic kidney disease, stage 3 (moderate): Secondary | ICD-10-CM | POA: Diagnosis not present

## 2019-01-04 DIAGNOSIS — I5032 Chronic diastolic (congestive) heart failure: Secondary | ICD-10-CM | POA: Diagnosis not present

## 2019-01-14 DIAGNOSIS — N183 Chronic kidney disease, stage 3 (moderate): Secondary | ICD-10-CM | POA: Diagnosis not present

## 2019-01-14 DIAGNOSIS — D649 Anemia, unspecified: Secondary | ICD-10-CM | POA: Diagnosis not present

## 2019-01-15 DIAGNOSIS — I130 Hypertensive heart and chronic kidney disease with heart failure and stage 1 through stage 4 chronic kidney disease, or unspecified chronic kidney disease: Secondary | ICD-10-CM | POA: Diagnosis not present

## 2019-01-15 DIAGNOSIS — N183 Chronic kidney disease, stage 3 (moderate): Secondary | ICD-10-CM | POA: Diagnosis not present

## 2019-01-16 DIAGNOSIS — Z20828 Contact with and (suspected) exposure to other viral communicable diseases: Secondary | ICD-10-CM | POA: Diagnosis not present

## 2019-01-22 DIAGNOSIS — I5032 Chronic diastolic (congestive) heart failure: Secondary | ICD-10-CM | POA: Diagnosis not present

## 2019-01-22 DIAGNOSIS — Z716 Tobacco abuse counseling: Secondary | ICD-10-CM | POA: Diagnosis not present

## 2019-01-22 DIAGNOSIS — I130 Hypertensive heart and chronic kidney disease with heart failure and stage 1 through stage 4 chronic kidney disease, or unspecified chronic kidney disease: Secondary | ICD-10-CM | POA: Diagnosis not present

## 2019-01-22 DIAGNOSIS — J449 Chronic obstructive pulmonary disease, unspecified: Secondary | ICD-10-CM | POA: Diagnosis not present

## 2019-01-23 DIAGNOSIS — R69 Illness, unspecified: Secondary | ICD-10-CM | POA: Diagnosis not present

## 2019-01-23 DIAGNOSIS — F411 Generalized anxiety disorder: Secondary | ICD-10-CM | POA: Diagnosis not present

## 2019-01-28 DIAGNOSIS — L97519 Non-pressure chronic ulcer of other part of right foot with unspecified severity: Secondary | ICD-10-CM | POA: Diagnosis not present

## 2019-01-28 DIAGNOSIS — R69 Illness, unspecified: Secondary | ICD-10-CM | POA: Diagnosis not present

## 2019-01-28 DIAGNOSIS — M6281 Muscle weakness (generalized): Secondary | ICD-10-CM | POA: Diagnosis not present

## 2019-01-28 DIAGNOSIS — M549 Dorsalgia, unspecified: Secondary | ICD-10-CM | POA: Diagnosis not present

## 2019-01-28 DIAGNOSIS — I10 Essential (primary) hypertension: Secondary | ICD-10-CM | POA: Diagnosis not present

## 2019-01-28 DIAGNOSIS — R2689 Other abnormalities of gait and mobility: Secondary | ICD-10-CM | POA: Diagnosis not present

## 2019-01-29 DIAGNOSIS — S81801D Unspecified open wound, right lower leg, subsequent encounter: Secondary | ICD-10-CM | POA: Diagnosis not present

## 2019-01-30 DIAGNOSIS — N184 Chronic kidney disease, stage 4 (severe): Secondary | ICD-10-CM | POA: Diagnosis not present

## 2019-01-30 DIAGNOSIS — I10 Essential (primary) hypertension: Secondary | ICD-10-CM | POA: Diagnosis not present

## 2019-01-30 DIAGNOSIS — E785 Hyperlipidemia, unspecified: Secondary | ICD-10-CM | POA: Diagnosis not present

## 2019-01-30 DIAGNOSIS — N183 Chronic kidney disease, stage 3 (moderate): Secondary | ICD-10-CM | POA: Diagnosis not present

## 2019-01-30 DIAGNOSIS — E039 Hypothyroidism, unspecified: Secondary | ICD-10-CM | POA: Diagnosis not present

## 2019-02-02 DIAGNOSIS — I130 Hypertensive heart and chronic kidney disease with heart failure and stage 1 through stage 4 chronic kidney disease, or unspecified chronic kidney disease: Secondary | ICD-10-CM | POA: Diagnosis not present

## 2019-02-02 DIAGNOSIS — R600 Localized edema: Secondary | ICD-10-CM | POA: Diagnosis not present

## 2019-02-04 DIAGNOSIS — M6281 Muscle weakness (generalized): Secondary | ICD-10-CM | POA: Diagnosis not present

## 2019-02-04 DIAGNOSIS — L97519 Non-pressure chronic ulcer of other part of right foot with unspecified severity: Secondary | ICD-10-CM | POA: Diagnosis not present

## 2019-02-04 DIAGNOSIS — R2689 Other abnormalities of gait and mobility: Secondary | ICD-10-CM | POA: Diagnosis not present

## 2019-02-05 DIAGNOSIS — S81802D Unspecified open wound, left lower leg, subsequent encounter: Secondary | ICD-10-CM | POA: Diagnosis not present

## 2019-02-05 DIAGNOSIS — S81801D Unspecified open wound, right lower leg, subsequent encounter: Secondary | ICD-10-CM | POA: Diagnosis not present

## 2019-02-13 DIAGNOSIS — R2689 Other abnormalities of gait and mobility: Secondary | ICD-10-CM | POA: Diagnosis not present

## 2019-02-13 DIAGNOSIS — L97519 Non-pressure chronic ulcer of other part of right foot with unspecified severity: Secondary | ICD-10-CM | POA: Diagnosis not present

## 2019-02-13 DIAGNOSIS — M6281 Muscle weakness (generalized): Secondary | ICD-10-CM | POA: Diagnosis not present

## 2019-02-16 DIAGNOSIS — I130 Hypertensive heart and chronic kidney disease with heart failure and stage 1 through stage 4 chronic kidney disease, or unspecified chronic kidney disease: Secondary | ICD-10-CM | POA: Diagnosis not present

## 2019-02-16 DIAGNOSIS — E1122 Type 2 diabetes mellitus with diabetic chronic kidney disease: Secondary | ICD-10-CM | POA: Diagnosis not present

## 2019-02-17 DIAGNOSIS — Z20828 Contact with and (suspected) exposure to other viral communicable diseases: Secondary | ICD-10-CM | POA: Diagnosis not present

## 2019-02-18 DIAGNOSIS — M6281 Muscle weakness (generalized): Secondary | ICD-10-CM | POA: Diagnosis not present

## 2019-02-18 DIAGNOSIS — L97519 Non-pressure chronic ulcer of other part of right foot with unspecified severity: Secondary | ICD-10-CM | POA: Diagnosis not present

## 2019-02-18 DIAGNOSIS — R2689 Other abnormalities of gait and mobility: Secondary | ICD-10-CM | POA: Diagnosis not present

## 2019-02-20 DIAGNOSIS — S81801D Unspecified open wound, right lower leg, subsequent encounter: Secondary | ICD-10-CM | POA: Diagnosis not present

## 2019-02-23 DIAGNOSIS — Z20828 Contact with and (suspected) exposure to other viral communicable diseases: Secondary | ICD-10-CM | POA: Diagnosis not present

## 2019-02-23 DIAGNOSIS — G629 Polyneuropathy, unspecified: Secondary | ICD-10-CM | POA: Diagnosis not present

## 2019-02-23 DIAGNOSIS — M79605 Pain in left leg: Secondary | ICD-10-CM | POA: Diagnosis not present

## 2019-02-23 DIAGNOSIS — M79604 Pain in right leg: Secondary | ICD-10-CM | POA: Diagnosis not present

## 2019-02-27 DIAGNOSIS — L97519 Non-pressure chronic ulcer of other part of right foot with unspecified severity: Secondary | ICD-10-CM | POA: Diagnosis not present

## 2019-02-27 DIAGNOSIS — R2689 Other abnormalities of gait and mobility: Secondary | ICD-10-CM | POA: Diagnosis not present

## 2019-02-27 DIAGNOSIS — M6281 Muscle weakness (generalized): Secondary | ICD-10-CM | POA: Diagnosis not present

## 2019-03-02 DIAGNOSIS — I130 Hypertensive heart and chronic kidney disease with heart failure and stage 1 through stage 4 chronic kidney disease, or unspecified chronic kidney disease: Secondary | ICD-10-CM | POA: Diagnosis not present

## 2019-03-02 DIAGNOSIS — E1122 Type 2 diabetes mellitus with diabetic chronic kidney disease: Secondary | ICD-10-CM | POA: Diagnosis not present

## 2019-03-02 DIAGNOSIS — M79604 Pain in right leg: Secondary | ICD-10-CM | POA: Diagnosis not present

## 2019-03-02 DIAGNOSIS — Z20828 Contact with and (suspected) exposure to other viral communicable diseases: Secondary | ICD-10-CM | POA: Diagnosis not present

## 2019-03-02 DIAGNOSIS — N183 Chronic kidney disease, stage 3 (moderate): Secondary | ICD-10-CM | POA: Diagnosis not present

## 2019-03-06 DIAGNOSIS — R2689 Other abnormalities of gait and mobility: Secondary | ICD-10-CM | POA: Diagnosis not present

## 2019-03-06 DIAGNOSIS — L97519 Non-pressure chronic ulcer of other part of right foot with unspecified severity: Secondary | ICD-10-CM | POA: Diagnosis not present

## 2019-03-06 DIAGNOSIS — M6281 Muscle weakness (generalized): Secondary | ICD-10-CM | POA: Diagnosis not present

## 2019-03-10 DIAGNOSIS — Z20828 Contact with and (suspected) exposure to other viral communicable diseases: Secondary | ICD-10-CM | POA: Diagnosis not present

## 2019-03-12 DIAGNOSIS — I5032 Chronic diastolic (congestive) heart failure: Secondary | ICD-10-CM | POA: Diagnosis not present

## 2019-03-12 DIAGNOSIS — E1122 Type 2 diabetes mellitus with diabetic chronic kidney disease: Secondary | ICD-10-CM | POA: Diagnosis not present

## 2019-03-12 DIAGNOSIS — I130 Hypertensive heart and chronic kidney disease with heart failure and stage 1 through stage 4 chronic kidney disease, or unspecified chronic kidney disease: Secondary | ICD-10-CM | POA: Diagnosis not present

## 2019-03-12 DIAGNOSIS — N1832 Chronic kidney disease, stage 3b: Secondary | ICD-10-CM | POA: Diagnosis not present

## 2019-03-13 DIAGNOSIS — Z20828 Contact with and (suspected) exposure to other viral communicable diseases: Secondary | ICD-10-CM | POA: Diagnosis not present

## 2019-03-14 DIAGNOSIS — I5032 Chronic diastolic (congestive) heart failure: Secondary | ICD-10-CM | POA: Diagnosis not present

## 2019-03-14 DIAGNOSIS — E039 Hypothyroidism, unspecified: Secondary | ICD-10-CM | POA: Diagnosis not present

## 2019-03-14 DIAGNOSIS — Z1159 Encounter for screening for other viral diseases: Secondary | ICD-10-CM | POA: Diagnosis not present

## 2019-03-14 DIAGNOSIS — R5383 Other fatigue: Secondary | ICD-10-CM | POA: Diagnosis not present

## 2019-03-14 DIAGNOSIS — R0602 Shortness of breath: Secondary | ICD-10-CM | POA: Diagnosis not present

## 2019-03-14 DIAGNOSIS — E559 Vitamin D deficiency, unspecified: Secondary | ICD-10-CM | POA: Diagnosis not present

## 2019-03-14 DIAGNOSIS — E119 Type 2 diabetes mellitus without complications: Secondary | ICD-10-CM | POA: Diagnosis not present

## 2019-03-14 DIAGNOSIS — Z79899 Other long term (current) drug therapy: Secondary | ICD-10-CM | POA: Diagnosis not present

## 2019-03-14 DIAGNOSIS — D649 Anemia, unspecified: Secondary | ICD-10-CM | POA: Diagnosis not present

## 2019-03-14 DIAGNOSIS — N184 Chronic kidney disease, stage 4 (severe): Secondary | ICD-10-CM | POA: Diagnosis not present

## 2019-03-17 DIAGNOSIS — I130 Hypertensive heart and chronic kidney disease with heart failure and stage 1 through stage 4 chronic kidney disease, or unspecified chronic kidney disease: Secondary | ICD-10-CM | POA: Diagnosis not present

## 2019-03-17 DIAGNOSIS — R5381 Other malaise: Secondary | ICD-10-CM | POA: Diagnosis not present

## 2019-03-17 DIAGNOSIS — N1832 Chronic kidney disease, stage 3b: Secondary | ICD-10-CM | POA: Diagnosis not present

## 2019-03-18 DIAGNOSIS — R05 Cough: Secondary | ICD-10-CM | POA: Diagnosis not present

## 2019-03-18 DIAGNOSIS — Z20828 Contact with and (suspected) exposure to other viral communicable diseases: Secondary | ICD-10-CM | POA: Diagnosis not present

## 2019-03-18 DIAGNOSIS — D649 Anemia, unspecified: Secondary | ICD-10-CM | POA: Diagnosis not present

## 2019-03-18 DIAGNOSIS — Z79899 Other long term (current) drug therapy: Secondary | ICD-10-CM | POA: Diagnosis not present

## 2019-03-19 DIAGNOSIS — Z79899 Other long term (current) drug therapy: Secondary | ICD-10-CM | POA: Diagnosis not present

## 2019-03-19 DIAGNOSIS — N390 Urinary tract infection, site not specified: Secondary | ICD-10-CM | POA: Diagnosis not present

## 2019-03-19 DIAGNOSIS — R319 Hematuria, unspecified: Secondary | ICD-10-CM | POA: Diagnosis not present

## 2019-03-22 DIAGNOSIS — Z20828 Contact with and (suspected) exposure to other viral communicable diseases: Secondary | ICD-10-CM | POA: Diagnosis not present

## 2019-03-23 DIAGNOSIS — N390 Urinary tract infection, site not specified: Secondary | ICD-10-CM | POA: Diagnosis not present

## 2019-03-23 DIAGNOSIS — E1122 Type 2 diabetes mellitus with diabetic chronic kidney disease: Secondary | ICD-10-CM | POA: Diagnosis not present

## 2019-03-23 DIAGNOSIS — R5381 Other malaise: Secondary | ICD-10-CM | POA: Diagnosis not present

## 2019-03-23 DIAGNOSIS — D649 Anemia, unspecified: Secondary | ICD-10-CM | POA: Diagnosis not present

## 2019-03-23 DIAGNOSIS — R0602 Shortness of breath: Secondary | ICD-10-CM | POA: Diagnosis not present

## 2019-03-23 DIAGNOSIS — I130 Hypertensive heart and chronic kidney disease with heart failure and stage 1 through stage 4 chronic kidney disease, or unspecified chronic kidney disease: Secondary | ICD-10-CM | POA: Diagnosis not present

## 2019-03-23 DIAGNOSIS — I10 Essential (primary) hypertension: Secondary | ICD-10-CM | POA: Diagnosis not present

## 2019-03-30 DIAGNOSIS — D649 Anemia, unspecified: Secondary | ICD-10-CM | POA: Diagnosis not present

## 2019-03-30 DIAGNOSIS — N1832 Chronic kidney disease, stage 3b: Secondary | ICD-10-CM | POA: Diagnosis not present

## 2019-03-30 DIAGNOSIS — E119 Type 2 diabetes mellitus without complications: Secondary | ICD-10-CM | POA: Diagnosis not present

## 2019-03-30 DIAGNOSIS — E1122 Type 2 diabetes mellitus with diabetic chronic kidney disease: Secondary | ICD-10-CM | POA: Diagnosis not present

## 2019-03-30 DIAGNOSIS — I5032 Chronic diastolic (congestive) heart failure: Secondary | ICD-10-CM | POA: Diagnosis not present

## 2019-03-30 DIAGNOSIS — R0602 Shortness of breath: Secondary | ICD-10-CM | POA: Diagnosis not present

## 2019-03-30 DIAGNOSIS — Z20828 Contact with and (suspected) exposure to other viral communicable diseases: Secondary | ICD-10-CM | POA: Diagnosis not present

## 2019-03-30 DIAGNOSIS — I130 Hypertensive heart and chronic kidney disease with heart failure and stage 1 through stage 4 chronic kidney disease, or unspecified chronic kidney disease: Secondary | ICD-10-CM | POA: Diagnosis not present

## 2019-03-30 DIAGNOSIS — I10 Essential (primary) hypertension: Secondary | ICD-10-CM | POA: Diagnosis not present

## 2019-03-30 NOTE — Progress Notes (Signed)
CARDIOLOGY CONSULT NOTE       Patient ID: Eric Scott MRN: [id number] DOB/AGE: 1950-12-26 68 y.o.  Admit date: (Not on file) Referring Physician: Blass Primary Physician: [name], MD Primary Cardiologist: New Reason for Consultation: CHF/HTN  Active Problems:   * No active hospital problems. *   HPI:  68 y.o. chronically ill male referred from NH for HTN recently hard to control and CHF. He is essentially wheel chair bount. Has had chronic LE edema seen by [facility] Dr Donzetta Matters with mild reflux in LLE. CRF include HTN, HLD. Dyspnea from COPD 2013 had lacunar infarcts with RUE weakness Previous history of uclers with GI bleeding and need for transfusion Last echo done 12/12/16 with normal EF 65-70% only abnormal relaxation normal for age No significant valve disease and no pulmonary HTN   He goes from wheel chair to bed. Has had chronic LE edema see above  Labs reviewed from 03/30/19  BuN 35 Cr 2.6 K 4.6 BNP 310  ROS All other systems reviewed and negative except as noted above  Past Medical History:  Diagnosis Date  . Anxiety   . Chronic pain    legs, back; MRI 05/2012 with mild thoracic degenerative changes no spinal stenosis   . COPD (chronic obstructive pulmonary disease) ([place])   . Depression   . Diabetic peripheral neuropathy ([place])    "chronic" (05/27/2012)  . Diastolic CHF ([place])   . Hypercholesteremia   . Hypertension   . Peripheral edema   . PVD (peripheral vascular disease) ([place])   . Spinal stenosis    mild lumbar (MRI 05/2012)-L2-L3 to L4-L5 , mild lumbar foraminal stenosis   . Stroke ([place]) 05/2012   Subacute, lacunar infarcts within the left basal ganglia and posterior limp of the left internal capsule/thalamus; "RUE; both feet weak" (05/27/2012)  . Tremor   . Type II diabetes mellitus ([name])     Family History  Problem Relation Age of Onset  . Colon cancer Neg Hx     Social History   Socioeconomic History  . Marital status: Legally Separated    Spouse  name: Not on file  . Number of children: Not on file  . Years of education: Not on file  . Highest education level: Not on file  Occupational History  . Not on file  Social Needs  . Financial resource strain: Not on file  . Food insecurity    Worry: Not on file    Inability: Not on file  . Transportation needs    Medical: Not on file    Non-medical: Not on file  Tobacco Use  . Smoking status: Current Every Day Smoker    Packs/day: 0.50    Years: 45.00    Pack years: 22.50    Types: Cigarettes    Last attempt to quit: 12/28/2014    Years since quitting: 4.2  . Smokeless tobacco: Never Used  . Tobacco comment: 1/2 pk per day  Substance and Sexual Activity  . Alcohol use: No    Comment: 05/27/2012 "drank gallons and gallons 20 yr ago or so; last drink  at least 10 yr ago"  . Drug use: No  . Sexual activity: Never  Lifestyle  . Physical activity    Days per week: Not on file    Minutes per session: Not on file  . Stress: Not on file  Relationships  . Social Herbalist on phone: Not on file    Gets together: Not on file  Attends religious service: Not on file    Active member of club or organization: Not on file    Attends meetings of clubs or organizations: Not on file    Relationship status: Not on file  . Intimate partner violence    Fear of current or ex partner: Not on file    Emotionally abused: Not on file    Physically abused: Not on file    Forced sexual activity: Not on file  Other Topics Concern  . Not on file  Social History Narrative   Patient lives in [place] with a friend.   He is divorced. Has one kid.   Not working for about 7 years now. Used to do plumbing work.    Past Surgical History:  Procedure Laterality Date  . ANKLE SURGERY    . ESOPHAGOGASTRODUODENOSCOPY (EGD) WITH PROPOFOL N/A 12/21/2016   Procedure: ESOPHAGOGASTRODUODENOSCOPY (EGD) WITH PROPOFOL;  Surgeon: Daneil Dolin, MD;  [facility];  Service:  Gastroenterology;  Laterality: N/A;  . ESOPHAGOGASTRODUODENOSCOPY (EGD) WITH PROPOFOL N/A 04/02/2017   Procedure: ESOPHAGOGASTRODUODENOSCOPY (EGD) WITH PROPOFOL;  Surgeon: Danie Binder, MD;  Location: [facility];  Service: Endoscopy;  Laterality: N/A;  1030   . HERNIA REPAIR  01/05/2004   "belly button" (05/27/2012)        Physical Exam: Blood pressure (!) 147/70, pulse 67, temperature 98.2 F (36.8 C), temperature source Temporal, height 6' (1.829 m), weight (!) 303 lb (137.4 kg), SpO2 93 %.    Affect appropriate Chronically ill male  HEENT: normal Neck supple with no adenopathy JVP normal no bruits no thyromegaly Lungs clear with no wheezing and good diaphragmatic motion Heart:  S1/S2 no murmur, no rub, gallop or click PMI normal Abdomen: benighn, BS positve, no tenderness, no AAA no bruit.  No HSM or HJR Distal pulses intact with no bruits Plus 2 bilateral LE edema Neuro non-focal Skin warm and dry No muscular weakness   Labs:   Lab Results  Component Value Date   WBC 14.9 (H) 03/28/2017   HGB 13.0 03/28/2017   HCT 40.7 03/28/2017   MCV 89.8 03/28/2017   PLT 318 03/28/2017   No results for input(s): NA, K, CL, CO2, BUN, CREATININE, CALCIUM, PROT, BILITOT, ALKPHOS, ALT, AST, GLUCOSE in the last 168 hours.  Invalid input(s): LABALBU Lab Results  Component Value Date   CKTOTAL 314 12/12/2016   CKMB 2 12/11/2016   TROPONINI 0.04 (HH) 12/10/2016    Lab Results  Component Value Date   CHOL 145 05/28/2012   Lab Results  Component Value Date   HDL 37 (L) 05/28/2012   Lab Results  Component Value Date   LDLCALC 84 05/28/2012   Lab Results  Component Value Date   TRIG 119 05/28/2012   Lab Results  Component Value Date   CHOLHDL 3.9 05/28/2012   No results found for: LDLDIRECT    Radiology: No results found.  EKG: 2018 SR LAD nonspecific ST changes    ASSESSMENT AND PLAN:   1. HTN:  Well controlled.  Continue current medications and low  sodium Dash type diet.   2. CHF:  Labeled as diastolic but related to obesity, DM, HTN and renal failure will update echo to make Sure EF still normal. May need trial of lasix for edema but given renal failure would need nephrology to prescribe and follow this  3. Edema:  Dependant seen by Dr Donzetta Matters [facility] mild reflux on left no intervention offered Elevate legs, low sodium diet  This is not representative of CHF but LE venous disease , obesity lack of mobility and renal failure  4. COPD:  No active wheezing continue inhalers  5. CRF:  CR elevated at 2.6 should have nephrology appointment and they can advise on any lasix   Signed: Jenkins Rouge 04/06/2019, 2:35 PM

## 2019-04-02 DIAGNOSIS — J449 Chronic obstructive pulmonary disease, unspecified: Secondary | ICD-10-CM | POA: Diagnosis not present

## 2019-04-02 DIAGNOSIS — M6281 Muscle weakness (generalized): Secondary | ICD-10-CM | POA: Diagnosis not present

## 2019-04-02 DIAGNOSIS — G629 Polyneuropathy, unspecified: Secondary | ICD-10-CM | POA: Diagnosis not present

## 2019-04-02 DIAGNOSIS — D509 Iron deficiency anemia, unspecified: Secondary | ICD-10-CM | POA: Diagnosis not present

## 2019-04-02 DIAGNOSIS — I5032 Chronic diastolic (congestive) heart failure: Secondary | ICD-10-CM | POA: Diagnosis not present

## 2019-04-02 DIAGNOSIS — E1122 Type 2 diabetes mellitus with diabetic chronic kidney disease: Secondary | ICD-10-CM | POA: Diagnosis not present

## 2019-04-04 DIAGNOSIS — Z20828 Contact with and (suspected) exposure to other viral communicable diseases: Secondary | ICD-10-CM | POA: Diagnosis not present

## 2019-04-06 ENCOUNTER — Other Ambulatory Visit: Payer: Self-pay

## 2019-04-06 ENCOUNTER — Encounter: Payer: Self-pay | Admitting: Cardiovascular Disease

## 2019-04-06 ENCOUNTER — Ambulatory Visit (INDEPENDENT_AMBULATORY_CARE_PROVIDER_SITE_OTHER): Payer: Medicare HMO | Admitting: Cardiovascular Disease

## 2019-04-06 VITALS — BP 147/70 | HR 67 | Temp 98.2°F | Ht 72.0 in | Wt 303.0 lb

## 2019-04-06 DIAGNOSIS — M6281 Muscle weakness (generalized): Secondary | ICD-10-CM | POA: Diagnosis not present

## 2019-04-06 DIAGNOSIS — I5032 Chronic diastolic (congestive) heart failure: Secondary | ICD-10-CM

## 2019-04-06 NOTE — Patient Instructions (Signed)
Medication Instructions: Your physician recommends that you continue on your current medications as directed. Please refer to the Current Medication list given to you today.  Labwork: NONE  Testing/Procedures: Your physician has requested that you have an echocardiogram. Echocardiography is a painless test that uses sound waves to create images of your heart. It provides your doctor with information about the size and shape of your heart and how well your heart's chambers and valves are working. This procedure takes approximately one hour. There are no restrictions for this procedure.    Follow-Up: Your physician recommends that you schedule a follow-up appointment in: AS NEEDED    Any Other Special Instructions Will Be Listed Below (If Applicable).  LAB WORK SHOWS RENAL FAILURE, PLEASE DISCUSS WITH KIDNEY DOCTOR AS TO WHAT DIURETIC HE SHOULD BE ON    If you need a refill on your cardiac medications before your next appointment, please call your pharmacy.

## 2019-04-07 DIAGNOSIS — M6281 Muscle weakness (generalized): Secondary | ICD-10-CM | POA: Diagnosis not present

## 2019-04-07 DIAGNOSIS — I5032 Chronic diastolic (congestive) heart failure: Secondary | ICD-10-CM | POA: Diagnosis not present

## 2019-04-08 DIAGNOSIS — M6281 Muscle weakness (generalized): Secondary | ICD-10-CM | POA: Diagnosis not present

## 2019-04-08 DIAGNOSIS — I5032 Chronic diastolic (congestive) heart failure: Secondary | ICD-10-CM | POA: Diagnosis not present

## 2019-04-09 ENCOUNTER — Other Ambulatory Visit: Payer: Self-pay

## 2019-04-09 ENCOUNTER — Ambulatory Visit (HOSPITAL_COMMUNITY)
Admission: RE | Admit: 2019-04-09 | Discharge: 2019-04-09 | Disposition: A | Discharge status: Home / Self Care | Payer: Medicare HMO | Source: Ambulatory Visit | Attending: Cardiovascular Disease | Admitting: Cardiovascular Disease

## 2019-04-09 ENCOUNTER — Encounter (HOSPITAL_COMMUNITY): Payer: Self-pay

## 2019-04-09 DIAGNOSIS — I130 Hypertensive heart and chronic kidney disease with heart failure and stage 1 through stage 4 chronic kidney disease, or unspecified chronic kidney disease: Secondary | ICD-10-CM | POA: Diagnosis not present

## 2019-04-09 DIAGNOSIS — I10 Essential (primary) hypertension: Secondary | ICD-10-CM | POA: Diagnosis not present

## 2019-04-09 DIAGNOSIS — D649 Anemia, unspecified: Secondary | ICD-10-CM | POA: Diagnosis not present

## 2019-04-09 DIAGNOSIS — E119 Type 2 diabetes mellitus without complications: Secondary | ICD-10-CM | POA: Diagnosis not present

## 2019-04-09 DIAGNOSIS — E1122 Type 2 diabetes mellitus with diabetic chronic kidney disease: Secondary | ICD-10-CM | POA: Diagnosis not present

## 2019-04-09 DIAGNOSIS — D519 Vitamin B12 deficiency anemia, unspecified: Secondary | ICD-10-CM | POA: Diagnosis not present

## 2019-04-09 DIAGNOSIS — I5032 Chronic diastolic (congestive) heart failure: Secondary | ICD-10-CM

## 2019-04-09 DIAGNOSIS — N1832 Chronic kidney disease, stage 3b: Secondary | ICD-10-CM | POA: Diagnosis not present

## 2019-04-09 DIAGNOSIS — R0602 Shortness of breath: Secondary | ICD-10-CM | POA: Diagnosis not present

## 2019-04-09 DIAGNOSIS — R7989 Other specified abnormal findings of blood chemistry: Secondary | ICD-10-CM | POA: Diagnosis not present

## 2019-04-09 DIAGNOSIS — M6281 Muscle weakness (generalized): Secondary | ICD-10-CM | POA: Diagnosis not present

## 2019-04-10 DIAGNOSIS — M6281 Muscle weakness (generalized): Secondary | ICD-10-CM | POA: Diagnosis not present

## 2019-04-10 DIAGNOSIS — I5032 Chronic diastolic (congestive) heart failure: Secondary | ICD-10-CM | POA: Diagnosis not present

## 2019-04-13 DIAGNOSIS — M6281 Muscle weakness (generalized): Secondary | ICD-10-CM | POA: Diagnosis not present

## 2019-04-13 DIAGNOSIS — I5032 Chronic diastolic (congestive) heart failure: Secondary | ICD-10-CM | POA: Diagnosis not present

## 2019-04-14 DIAGNOSIS — I5032 Chronic diastolic (congestive) heart failure: Secondary | ICD-10-CM | POA: Diagnosis not present

## 2019-04-14 DIAGNOSIS — M6281 Muscle weakness (generalized): Secondary | ICD-10-CM | POA: Diagnosis not present

## 2019-04-16 DIAGNOSIS — I5032 Chronic diastolic (congestive) heart failure: Secondary | ICD-10-CM | POA: Diagnosis not present

## 2019-04-16 DIAGNOSIS — M6281 Muscle weakness (generalized): Secondary | ICD-10-CM | POA: Diagnosis not present

## 2019-04-17 DIAGNOSIS — I5032 Chronic diastolic (congestive) heart failure: Secondary | ICD-10-CM | POA: Diagnosis not present

## 2019-04-17 DIAGNOSIS — M6281 Muscle weakness (generalized): Secondary | ICD-10-CM | POA: Diagnosis not present

## 2019-04-20 ENCOUNTER — Other Ambulatory Visit: Payer: Self-pay

## 2019-04-20 ENCOUNTER — Inpatient Hospital Stay (HOSPITAL_COMMUNITY)
Admission: EM | Admit: 2019-04-20 | Discharge: 2019-04-22 | DRG: 189 | Disposition: A | Discharge status: Skilled Nursing Facility | Payer: Medicare HMO | Attending: Internal Medicine | Admitting: Internal Medicine

## 2019-04-20 ENCOUNTER — Emergency Department (HOSPITAL_COMMUNITY): Payer: Medicare HMO

## 2019-04-20 ENCOUNTER — Inpatient Hospital Stay (HOSPITAL_COMMUNITY): Payer: Medicare HMO

## 2019-04-20 ENCOUNTER — Encounter (HOSPITAL_COMMUNITY): Payer: Self-pay | Admitting: Emergency Medicine

## 2019-04-20 DIAGNOSIS — Z743 Need for continuous supervision: Secondary | ICD-10-CM | POA: Diagnosis not present

## 2019-04-20 DIAGNOSIS — I130 Hypertensive heart and chronic kidney disease with heart failure and stage 1 through stage 4 chronic kidney disease, or unspecified chronic kidney disease: Secondary | ICD-10-CM | POA: Diagnosis not present

## 2019-04-20 DIAGNOSIS — R21 Rash and other nonspecific skin eruption: Secondary | ICD-10-CM | POA: Diagnosis present

## 2019-04-20 DIAGNOSIS — I10 Essential (primary) hypertension: Secondary | ICD-10-CM | POA: Diagnosis not present

## 2019-04-20 DIAGNOSIS — R5381 Other malaise: Secondary | ICD-10-CM | POA: Diagnosis present

## 2019-04-20 DIAGNOSIS — R0902 Hypoxemia: Secondary | ICD-10-CM | POA: Diagnosis present

## 2019-04-20 DIAGNOSIS — Z888 Allergy status to other drugs, medicaments and biological substances status: Secondary | ICD-10-CM

## 2019-04-20 DIAGNOSIS — R2681 Unsteadiness on feet: Secondary | ICD-10-CM | POA: Diagnosis not present

## 2019-04-20 DIAGNOSIS — E875 Hyperkalemia: Secondary | ICD-10-CM | POA: Diagnosis present

## 2019-04-20 DIAGNOSIS — D649 Anemia, unspecified: Secondary | ICD-10-CM

## 2019-04-20 DIAGNOSIS — E1122 Type 2 diabetes mellitus with diabetic chronic kidney disease: Secondary | ICD-10-CM

## 2019-04-20 DIAGNOSIS — R05 Cough: Secondary | ICD-10-CM | POA: Diagnosis not present

## 2019-04-20 DIAGNOSIS — I5033 Acute on chronic diastolic (congestive) heart failure: Secondary | ICD-10-CM | POA: Diagnosis present

## 2019-04-20 DIAGNOSIS — N1832 Chronic kidney disease, stage 3b: Secondary | ICD-10-CM | POA: Diagnosis present

## 2019-04-20 DIAGNOSIS — J441 Chronic obstructive pulmonary disease with (acute) exacerbation: Secondary | ICD-10-CM | POA: Diagnosis present

## 2019-04-20 DIAGNOSIS — R600 Localized edema: Secondary | ICD-10-CM | POA: Diagnosis not present

## 2019-04-20 DIAGNOSIS — K219 Gastro-esophageal reflux disease without esophagitis: Secondary | ICD-10-CM | POA: Diagnosis present

## 2019-04-20 DIAGNOSIS — K760 Fatty (change of) liver, not elsewhere classified: Secondary | ICD-10-CM | POA: Diagnosis present

## 2019-04-20 DIAGNOSIS — R0603 Acute respiratory distress: Secondary | ICD-10-CM

## 2019-04-20 DIAGNOSIS — Z20828 Contact with and (suspected) exposure to other viral communicable diseases: Secondary | ICD-10-CM | POA: Diagnosis present

## 2019-04-20 DIAGNOSIS — I739 Peripheral vascular disease, unspecified: Secondary | ICD-10-CM | POA: Diagnosis not present

## 2019-04-20 DIAGNOSIS — I5022 Chronic systolic (congestive) heart failure: Secondary | ICD-10-CM | POA: Diagnosis not present

## 2019-04-20 DIAGNOSIS — Z6841 Body Mass Index (BMI) 40.0 and over, adult: Secondary | ICD-10-CM

## 2019-04-20 DIAGNOSIS — F17210 Nicotine dependence, cigarettes, uncomplicated: Secondary | ICD-10-CM | POA: Diagnosis present

## 2019-04-20 DIAGNOSIS — Z86010 Personal history of colonic polyps: Secondary | ICD-10-CM

## 2019-04-20 DIAGNOSIS — R7989 Other specified abnormal findings of blood chemistry: Secondary | ICD-10-CM | POA: Diagnosis not present

## 2019-04-20 DIAGNOSIS — E1165 Type 2 diabetes mellitus with hyperglycemia: Secondary | ICD-10-CM | POA: Diagnosis not present

## 2019-04-20 DIAGNOSIS — Z79899 Other long term (current) drug therapy: Secondary | ICD-10-CM | POA: Diagnosis not present

## 2019-04-20 DIAGNOSIS — R531 Weakness: Secondary | ICD-10-CM

## 2019-04-20 DIAGNOSIS — I87393 Chronic venous hypertension (idiopathic) with other complications of bilateral lower extremity: Secondary | ICD-10-CM | POA: Diagnosis not present

## 2019-04-20 DIAGNOSIS — E1142 Type 2 diabetes mellitus with diabetic polyneuropathy: Secondary | ICD-10-CM | POA: Diagnosis present

## 2019-04-20 DIAGNOSIS — M6281 Muscle weakness (generalized): Secondary | ICD-10-CM | POA: Diagnosis not present

## 2019-04-20 DIAGNOSIS — F329 Major depressive disorder, single episode, unspecified: Secondary | ICD-10-CM | POA: Diagnosis present

## 2019-04-20 DIAGNOSIS — E1121 Type 2 diabetes mellitus with diabetic nephropathy: Secondary | ICD-10-CM

## 2019-04-20 DIAGNOSIS — G8929 Other chronic pain: Secondary | ICD-10-CM | POA: Diagnosis present

## 2019-04-20 DIAGNOSIS — J9601 Acute respiratory failure with hypoxia: Principal | ICD-10-CM

## 2019-04-20 DIAGNOSIS — E785 Hyperlipidemia, unspecified: Secondary | ICD-10-CM | POA: Diagnosis present

## 2019-04-20 DIAGNOSIS — Z8673 Personal history of transient ischemic attack (TIA), and cerebral infarction without residual deficits: Secondary | ICD-10-CM

## 2019-04-20 DIAGNOSIS — E1151 Type 2 diabetes mellitus with diabetic peripheral angiopathy without gangrene: Secondary | ICD-10-CM | POA: Diagnosis present

## 2019-04-20 DIAGNOSIS — Z7982 Long term (current) use of aspirin: Secondary | ICD-10-CM

## 2019-04-20 DIAGNOSIS — I872 Venous insufficiency (chronic) (peripheral): Secondary | ICD-10-CM | POA: Diagnosis present

## 2019-04-20 DIAGNOSIS — R0602 Shortness of breath: Secondary | ICD-10-CM | POA: Diagnosis not present

## 2019-04-20 DIAGNOSIS — J9690 Respiratory failure, unspecified, unspecified whether with hypoxia or hypercapnia: Secondary | ICD-10-CM | POA: Diagnosis not present

## 2019-04-20 DIAGNOSIS — Z91018 Allergy to other foods: Secondary | ICD-10-CM

## 2019-04-20 DIAGNOSIS — F419 Anxiety disorder, unspecified: Secondary | ICD-10-CM | POA: Diagnosis present

## 2019-04-20 DIAGNOSIS — I5032 Chronic diastolic (congestive) heart failure: Secondary | ICD-10-CM

## 2019-04-20 DIAGNOSIS — E1140 Type 2 diabetes mellitus with diabetic neuropathy, unspecified: Secondary | ICD-10-CM

## 2019-04-20 DIAGNOSIS — Z8711 Personal history of peptic ulcer disease: Secondary | ICD-10-CM | POA: Diagnosis not present

## 2019-04-20 DIAGNOSIS — R069 Unspecified abnormalities of breathing: Secondary | ICD-10-CM | POA: Diagnosis not present

## 2019-04-20 DIAGNOSIS — Z794 Long term (current) use of insulin: Secondary | ICD-10-CM

## 2019-04-20 DIAGNOSIS — Z7401 Bed confinement status: Secondary | ICD-10-CM | POA: Diagnosis not present

## 2019-04-20 DIAGNOSIS — R140 Abdominal distension (gaseous): Secondary | ICD-10-CM | POA: Diagnosis not present

## 2019-04-20 DIAGNOSIS — R69 Illness, unspecified: Secondary | ICD-10-CM | POA: Diagnosis not present

## 2019-04-20 LAB — COMPREHENSIVE METABOLIC PANEL
ALT: 8 U/L (ref 0–44)
AST: 10 U/L — ABNORMAL LOW (ref 15–41)
Albumin: 3.3 g/dL — ABNORMAL LOW (ref 3.5–5.0)
Alkaline Phosphatase: 77 U/L (ref 38–126)
Anion gap: 12 (ref 5–15)
BUN: 32 mg/dL — ABNORMAL HIGH (ref 8–23)
CO2: 24 mmol/L (ref 22–32)
Calcium: 8.4 mg/dL — ABNORMAL LOW (ref 8.9–10.3)
Chloride: 106 mmol/L (ref 98–111)
Creatinine, Ser: 2.84 mg/dL — ABNORMAL HIGH (ref 0.61–1.24)
GFR calc Af Amer: 25 mL/min — ABNORMAL LOW (ref >60)
GFR calc non Af Amer: 22 mL/min — ABNORMAL LOW (ref >60)
Glucose, Bld: 152 mg/dL — ABNORMAL HIGH (ref 70–99)
Potassium: 4.4 mmol/L (ref 3.5–5.1)
Sodium: 142 mmol/L (ref 135–145)
Total Bilirubin: 0.8 mg/dL (ref 0.3–1.2)
Total Protein: 7.5 g/dL (ref 6.5–8.1)

## 2019-04-20 LAB — PROTIME-INR
INR: 1.2 (ref 0.8–1.2)
Prothrombin Time: 14.6 seconds (ref 11.4–15.2)

## 2019-04-20 LAB — CBC WITH DIFFERENTIAL/PLATELET
Abs Immature Granulocytes: 0.08 10*3/uL — ABNORMAL HIGH (ref 0.00–0.07)
Basophils Absolute: 0.1 10*3/uL (ref 0.0–0.1)
Basophils Relative: 0 %
Eosinophils Absolute: 0.7 10*3/uL — ABNORMAL HIGH (ref 0.0–0.5)
Eosinophils Relative: 6 %
HCT: 27.4 % — ABNORMAL LOW (ref 39.0–52.0)
Hemoglobin: 8.3 g/dL — ABNORMAL LOW (ref 13.0–17.0)
Immature Granulocytes: 1 %
Lymphocytes Relative: 11 %
Lymphs Abs: 1.3 10*3/uL (ref 0.7–4.0)
MCH: 29 pg (ref 26.0–34.0)
MCHC: 30.3 g/dL (ref 30.0–36.0)
MCV: 95.8 fL (ref 80.0–100.0)
Monocytes Absolute: 0.6 10*3/uL (ref 0.1–1.0)
Monocytes Relative: 5 %
Neutro Abs: 9 10*3/uL — ABNORMAL HIGH (ref 1.7–7.7)
Neutrophils Relative %: 77 %
Platelets: 301 10*3/uL (ref 150–400)
RBC: 2.86 MIL/uL — ABNORMAL LOW (ref 4.22–5.81)
RDW: 17.5 % — ABNORMAL HIGH (ref 11.5–15.5)
WBC: 11.7 10*3/uL — ABNORMAL HIGH (ref 4.0–10.5)
nRBC: 0 % (ref 0.0–0.2)

## 2019-04-20 LAB — SARS CORONAVIRUS 2 (TAT 6-24 HRS): SARS Coronavirus 2: NEGATIVE

## 2019-04-20 LAB — TYPE AND SCREEN
ABO/RH(D): O NEG
Antibody Screen: NEGATIVE

## 2019-04-20 LAB — TRIGLYCERIDES: Triglycerides: 127 mg/dL (ref <150)

## 2019-04-20 LAB — LACTIC ACID, PLASMA
Lactic Acid, Venous: 1.4 mmol/L (ref 0.5–1.9)
Lactic Acid, Venous: 0.8 mmol/L (ref 0.5–1.9)

## 2019-04-20 LAB — C-REACTIVE PROTEIN: CRP: 6.5 mg/dL — ABNORMAL HIGH (ref <1.0)

## 2019-04-20 LAB — HEMOGLOBIN A1C
Hgb A1c MFr Bld: 5.8 % — ABNORMAL HIGH (ref 4.8–5.6)
Mean Plasma Glucose: 119.76 mg/dL

## 2019-04-20 LAB — POC OCCULT BLOOD, ED: Fecal Occult Bld: POSITIVE — AB

## 2019-04-20 LAB — BLOOD GAS, ARTERIAL
Acid-Base Excess: 1.1 mmol/L (ref 0.0–2.0)
Bicarbonate: 25.1 mmol/L (ref 20.0–28.0)
FIO2: 28
O2 Saturation: 88.5 %
Patient temperature: 37
pCO2 arterial: 46.4 mmHg (ref 32.0–48.0)
pH, Arterial: 7.365 (ref 7.350–7.450)
pO2, Arterial: 58.8 mmHg — ABNORMAL LOW (ref 83.0–108.0)

## 2019-04-20 LAB — PROCALCITONIN: Procalcitonin: 0.11 ng/mL

## 2019-04-20 LAB — CBG MONITORING, ED
Glucose-Capillary: 319 mg/dL — ABNORMAL HIGH (ref 70–99)
Glucose-Capillary: 339 mg/dL — ABNORMAL HIGH (ref 70–99)

## 2019-04-20 LAB — APTT: aPTT: 35 seconds (ref 24–36)

## 2019-04-20 LAB — FIBRINOGEN: Fibrinogen: 719 mg/dL — ABNORMAL HIGH (ref 210–475)

## 2019-04-20 LAB — LACTATE DEHYDROGENASE: LDH: 163 U/L (ref 98–192)

## 2019-04-20 LAB — D-DIMER, QUANTITATIVE: D-Dimer, Quant: 2.8 ug/mL-FEU — ABNORMAL HIGH (ref 0.00–0.50)

## 2019-04-20 LAB — FERRITIN: Ferritin: 158 ng/mL (ref 24–336)

## 2019-04-20 LAB — TROPONIN I (HIGH SENSITIVITY): Troponin I (High Sensitivity): 6 ng/L (ref <18)

## 2019-04-20 LAB — BRAIN NATRIURETIC PEPTIDE: B Natriuretic Peptide: 250 pg/mL — ABNORMAL HIGH (ref 0.0–100.0)

## 2019-04-20 MED ORDER — FUROSEMIDE 10 MG/ML IJ SOLN
40.0000 mg | Freq: Every day | INTRAMUSCULAR | Status: DC
  Administered 2019-04-20 – 2019-04-22 (×3): 40 mg via INTRAVENOUS
  Filled 2019-04-20 (×3): qty 4

## 2019-04-20 MED ORDER — METOPROLOL TARTRATE 50 MG PO TABS
50.0000 mg | ORAL_TABLET | Freq: Two times a day (BID) | ORAL | Status: DC
  Administered 2019-04-20 – 2019-04-22 (×5): 50 mg via ORAL
  Filled 2019-04-20 (×5): qty 1

## 2019-04-20 MED ORDER — HYDROCODONE-ACETAMINOPHEN 5-325 MG PO TABS
1.0000 | ORAL_TABLET | Freq: Four times a day (QID) | ORAL | Status: DC | PRN
  Administered 2019-04-20: 1 via ORAL
  Filled 2019-04-20: qty 1

## 2019-04-20 MED ORDER — ALBUTEROL SULFATE HFA 108 (90 BASE) MCG/ACT IN AERS
2.0000 | INHALATION_SPRAY | RESPIRATORY_TRACT | Status: AC
  Administered 2019-04-20: 2 via RESPIRATORY_TRACT
  Filled 2019-04-20: qty 6.7

## 2019-04-20 MED ORDER — INSULIN GLARGINE 100 UNIT/ML ~~LOC~~ SOLN
45.0000 [IU] | Freq: Every day | SUBCUTANEOUS | Status: DC
  Administered 2019-04-20 – 2019-04-21 (×2): 45 [IU] via SUBCUTANEOUS
  Filled 2019-04-20 (×3): qty 0.45

## 2019-04-20 MED ORDER — ACETAMINOPHEN 325 MG PO TABS: 650.0000 mg | ORAL_TABLET | Freq: Four times a day (QID) | ORAL | Status: DC | PRN

## 2019-04-20 MED ORDER — NICOTINE 14 MG/24HR TD PT24
14.0000 mg | MEDICATED_PATCH | Freq: Every day | TRANSDERMAL | Status: DC
  Administered 2019-04-20 – 2019-04-22 (×3): 14 mg via TRANSDERMAL
  Filled 2019-04-20 (×3): qty 1

## 2019-04-20 MED ORDER — GABAPENTIN 400 MG PO CAPS
400.0000 mg | ORAL_CAPSULE | Freq: Three times a day (TID) | ORAL | Status: DC
  Administered 2019-04-20 – 2019-04-22 (×6): 400 mg via ORAL
  Filled 2019-04-20 (×6): qty 1

## 2019-04-20 MED ORDER — INSULIN ASPART 100 UNIT/ML ~~LOC~~ SOLN
0.0000 [IU] | Freq: Three times a day (TID) | SUBCUTANEOUS | Status: DC
  Administered 2019-04-20 – 2019-04-21 (×2): 11 [IU] via SUBCUTANEOUS
  Administered 2019-04-21 – 2019-04-22 (×3): 3 [IU] via SUBCUTANEOUS
  Administered 2019-04-22: 8 [IU] via SUBCUTANEOUS
  Filled 2019-04-20: qty 1

## 2019-04-20 MED ORDER — SODIUM CHLORIDE 0.9% FLUSH: 3.0000 mL | INTRAVENOUS | Status: DC | PRN

## 2019-04-20 MED ORDER — AMLODIPINE BESYLATE 5 MG PO TABS
10.0000 mg | ORAL_TABLET | Freq: Every day | ORAL | Status: DC
  Administered 2019-04-20 – 2019-04-22 (×3): 10 mg via ORAL
  Filled 2019-04-20 (×3): qty 2

## 2019-04-20 MED ORDER — ADULT MULTIVITAMIN W/MINERALS CH
1.0000 | ORAL_TABLET | Freq: Every day | ORAL | Status: DC
  Administered 2019-04-21: 1 via ORAL
  Filled 2019-04-20 (×3): qty 1

## 2019-04-20 MED ORDER — ASPIRIN EC 81 MG PO TBEC
81.0000 mg | DELAYED_RELEASE_TABLET | Freq: Every day | ORAL | Status: DC
  Administered 2019-04-20 – 2019-04-22 (×3): 81 mg via ORAL
  Filled 2019-04-20 (×3): qty 1

## 2019-04-20 MED ORDER — FUROSEMIDE 10 MG/ML IJ SOLN
40.0000 mg | INTRAMUSCULAR | Status: AC
  Administered 2019-04-20: 40 mg via INTRAVENOUS
  Filled 2019-04-20: qty 4

## 2019-04-20 MED ORDER — OMEGA-3-ACID ETHYL ESTERS 1 G PO CAPS
1.0000 g | ORAL_CAPSULE | Freq: Two times a day (BID) | ORAL | Status: DC
  Administered 2019-04-20 – 2019-04-22 (×4): 1 g via ORAL
  Filled 2019-04-20 (×7): qty 1

## 2019-04-20 MED ORDER — TECHNETIUM TO 99M ALBUMIN AGGREGATED
1.5000 | Freq: Once | INTRAVENOUS | Status: AC | PRN
  Administered 2019-04-20: 1.5 via INTRAVENOUS

## 2019-04-20 MED ORDER — DULOXETINE HCL 60 MG PO CPEP
60.0000 mg | ORAL_CAPSULE | Freq: Every day | ORAL | Status: DC
  Administered 2019-04-20 – 2019-04-22 (×3): 60 mg via ORAL
  Filled 2019-04-20 (×2): qty 1
  Filled 2019-04-20: qty 2

## 2019-04-20 MED ORDER — SODIUM CHLORIDE 0.9% FLUSH
3.0000 mL | Freq: Two times a day (BID) | INTRAVENOUS | Status: DC
  Administered 2019-04-20 – 2019-04-22 (×4): 3 mL via INTRAVENOUS

## 2019-04-20 MED ORDER — LIDOCAINE 5 % EX PTCH
1.0000 | MEDICATED_PATCH | Freq: Two times a day (BID) | CUTANEOUS | Status: DC
  Administered 2019-04-20 – 2019-04-22 (×3): 1 via TRANSDERMAL
  Filled 2019-04-20 (×5): qty 1

## 2019-04-20 MED ORDER — FLUTICASONE FUROATE-VILANTEROL 100-25 MCG/INH IN AEPB
2.0000 | INHALATION_SPRAY | Freq: Every day | RESPIRATORY_TRACT | Status: DC
  Administered 2019-04-21 – 2019-04-22 (×2): 2 via RESPIRATORY_TRACT
  Filled 2019-04-20: qty 28

## 2019-04-20 MED ORDER — SODIUM CHLORIDE 0.9 % IV SOLN
250.0000 mL | INTRAVENOUS | Status: DC | PRN
  Administered 2019-04-20: 10 mL via INTRAVENOUS

## 2019-04-20 MED ORDER — INSULIN ASPART 100 UNIT/ML ~~LOC~~ SOLN: 6.0000 [IU] | Freq: Three times a day (TID) | SUBCUTANEOUS | Status: DC

## 2019-04-20 MED ORDER — CALCIUM CARBONATE ANTACID 500 MG PO CHEW
2.0000 | CHEWABLE_TABLET | Freq: Two times a day (BID) | ORAL | Status: DC
  Administered 2019-04-20 – 2019-04-22 (×5): 400 mg via ORAL
  Filled 2019-04-20 (×5): qty 2

## 2019-04-20 MED ORDER — ONDANSETRON HCL 4 MG/2ML IJ SOLN: 4.0000 mg | Freq: Four times a day (QID) | INTRAMUSCULAR | Status: DC | PRN

## 2019-04-20 MED ORDER — INSULIN ASPART 100 UNIT/ML ~~LOC~~ SOLN
0.0000 [IU] | Freq: Every day | SUBCUTANEOUS | Status: DC
  Administered 2019-04-20: 4 [IU] via SUBCUTANEOUS
  Filled 2019-04-20: qty 1

## 2019-04-20 MED ORDER — AEROCHAMBER Z-STAT PLUS/MEDIUM MISC
1.0000 | Freq: Once | Status: AC
  Administered 2019-04-20: 1

## 2019-04-20 MED ORDER — VITAMIN B-12 1000 MCG PO TABS
1000.0000 ug | ORAL_TABLET | Freq: Every day | ORAL | Status: DC
  Administered 2019-04-21 – 2019-04-22 (×2): 1000 ug via ORAL
  Filled 2019-04-20 (×4): qty 1

## 2019-04-20 MED ORDER — SODIUM CHLORIDE 0.9 % IV SOLN
100.0000 mg | Freq: Two times a day (BID) | INTRAVENOUS | Status: AC
  Administered 2019-04-20 – 2019-04-22 (×4): 100 mg via INTRAVENOUS
  Filled 2019-04-20 (×5): qty 100

## 2019-04-20 MED ORDER — ACETAMINOPHEN 650 MG RE SUPP: 650.0000 mg | Freq: Four times a day (QID) | RECTAL | Status: DC | PRN

## 2019-04-20 MED ORDER — ALPRAZOLAM 0.5 MG PO TABS
0.5000 mg | ORAL_TABLET | Freq: Two times a day (BID) | ORAL | Status: DC | PRN
  Administered 2019-04-21: 0.5 mg via ORAL
  Filled 2019-04-20: qty 1

## 2019-04-20 MED ORDER — VITAMIN D 25 MCG (1000 UNIT) PO TABS
3000.0000 [IU] | ORAL_TABLET | Freq: Every day | ORAL | Status: DC
  Administered 2019-04-21 – 2019-04-22 (×2): 3000 [IU] via ORAL
  Filled 2019-04-20 (×4): qty 3

## 2019-04-20 MED ORDER — PANTOPRAZOLE SODIUM 40 MG PO TBEC
40.0000 mg | DELAYED_RELEASE_TABLET | Freq: Every day | ORAL | Status: DC
  Administered 2019-04-21 – 2019-04-22 (×2): 40 mg via ORAL
  Filled 2019-04-20 (×2): qty 1

## 2019-04-20 MED ORDER — METHYLPREDNISOLONE SODIUM SUCC 125 MG IJ SOLR
125.0000 mg | Freq: Once | INTRAMUSCULAR | Status: AC
  Administered 2019-04-20: 125 mg via INTRAVENOUS
  Filled 2019-04-20: qty 2

## 2019-04-20 MED ORDER — MAGNESIUM SULFATE 2 GM/50ML IV SOLN
2.0000 g | INTRAVENOUS | Status: AC
  Administered 2019-04-20: 2 g via INTRAVENOUS
  Filled 2019-04-20: qty 50

## 2019-04-20 MED ORDER — ENOXAPARIN SODIUM 30 MG/0.3ML ~~LOC~~ SOLN
30.0000 mg | SUBCUTANEOUS | Status: DC
  Administered 2019-04-20: 30 mg via SUBCUTANEOUS
  Filled 2019-04-20: qty 0.3

## 2019-04-20 MED ORDER — MAGNESIUM OXIDE 400 (241.3 MG) MG PO TABS
400.0000 mg | ORAL_TABLET | Freq: Every day | ORAL | Status: DC
  Administered 2019-04-21 – 2019-04-22 (×2): 400 mg via ORAL
  Filled 2019-04-20 (×2): qty 1

## 2019-04-20 MED ORDER — ONDANSETRON HCL 4 MG PO TABS: 4.0000 mg | ORAL_TABLET | Freq: Four times a day (QID) | ORAL | Status: DC | PRN

## 2019-04-20 MED ORDER — ALBUTEROL SULFATE HFA 108 (90 BASE) MCG/ACT IN AERS
4.0000 | INHALATION_SPRAY | RESPIRATORY_TRACT | Status: AC
  Administered 2019-04-20: 4 via RESPIRATORY_TRACT
  Filled 2019-04-20: qty 6.7

## 2019-04-20 MED ORDER — METHYLPREDNISOLONE SODIUM SUCC 40 MG IJ SOLR
40.0000 mg | Freq: Four times a day (QID) | INTRAMUSCULAR | Status: DC
  Administered 2019-04-20 – 2019-04-21 (×3): 40 mg via INTRAVENOUS
  Filled 2019-04-20 (×3): qty 1

## 2019-04-20 NOTE — ED Triage Notes (Signed)
Pt from [facility]. EMS reports sob. 83% % on room air. Pt placed on 6 L [place].

## 2019-04-20 NOTE — ED Provider Notes (Signed)
[facility] EMERGENCY DEPARTMENT Provider Note   CSN: [id number] Arrival date & time: 04/20/19  7408     History   Chief Complaint No chief complaint on file.   HPI Eric Scott is a 68 y.o. male.     HPI  This patient is a 68 year old male, known history of congestive heart failure, stroke, diabetes, COPD and chronic pain.  The patient presents by ambulance after becoming short of breath at his facility at [facility].  The patient is having a difficult time give me information because of his increasing respiratory rate and respiratory distress, level 5 caveat applies secondary to increased work of breathing.  However in 2-3 word sentences he is able to tell me that this started overnight, he has felt this way in the past intermittently, he has not received albuterol treatments prior to arrival  The paramedics report that the patient was found to be hypoxic to 80% this morning, he was tachypneic and placed on oxygen at the facility, paramedics note that they are able to get his oxygen to 95% with 6 L by nasal cannula.  The patient has been noted to be edematous in his legs, he is not ambulatory at baseline.  He tells me that the swelling in his leg seems to be chronic, not significantly worse than baseline   Based on the medical record reviewed the last echocardiogram was performed on December 12, 2016 and showed an ejection fraction of 65 to 70%, he had grade 1 diastolic dysfunction, he saw the cardiologist most recently 2 weeks ago, echocardiogram was ordered but has not been done.  Creatinine is chronically elevated at 2.6, not currently taking diuretics.  He was noted to have difficulty control blood pressure.  Ultrasound of the legs in March 2020 showed no obvious DVT.   Past Medical History:  Diagnosis Date  . Anxiety   . Chronic pain    legs, back; MRI 05/2012 with mild thoracic degenerative changes no spinal stenosis   . COPD (chronic obstructive pulmonary disease)  ([place])   . Depression   . Diabetic peripheral neuropathy ([place])    "chronic" (05/27/2012)  . Diastolic CHF ([place])   . Hypercholesteremia   . Hypertension   . Peripheral edema   . PVD (peripheral vascular disease) ([place])   . Spinal stenosis    mild lumbar (MRI 05/2012)-L2-L3 to L4-L5 , mild lumbar foraminal stenosis   . Stroke ([place]) 05/2012   Subacute, lacunar infarcts within the left basal ganglia and posterior limp of the left internal capsule/thalamus; "RUE; both feet weak" (05/27/2012)  . Tremor   . Type II diabetes mellitus [facility])     Patient Active Problem List   Diagnosis Date Noted  . GERD (gastroesophageal reflux disease) 08/08/2018  . PUD (peptic ulcer disease)   . Gastric erosions 02/13/2017  . Duodenal ulcer 02/13/2017  . Transaminitis   . UGI bleed   . Arm DVT (deep venous thromboembolism), acute, right ([place]) 12/18/2016  . Uremia 12/10/2016  . Acute renal failure ([place])   . Volume depletion 11/23/2016  . Hypotension   . Liver enzyme elevation   . 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 Arterial insufficiency of lower extremity ([place]) 11/15/2016  . Pressure injury of skin 11/09/2016  . Sepsis ([place]) 11/09/2016  . Osteomyelitis ([place]) 11/09/2016  . Wounds, multiple 11/08/2016  . Chronic venous insufficiency 10/12/2016  . Critical lower limb ischemia 10/12/2016  . Fatty liver 09/10/2016  .  History of colonic polyps 09/10/2016  . Gallstone 09/10/2016  . Edema 03/04/2015  . AKI (acute kidney injury) ([place]) 03/04/2015  . Fall 03/04/2015  . Generalized weakness 03/04/2015  . Diabetic polyneuropathy associated with type 2 diabetes mellitus ([place])   . Anxiety   . PVD (peripheral vascular disease) ([place])   . HCAP (healthcare-associated pneumonia) 07/20/2014  . Pneumonia 07/20/2014  . Weight gain 05/02/2014  . Coarse tremors 04/18/2014  . Anxiety state 04/18/2014  . Acute hypoxemic respiratory failure  ([place]) 04/07/2014  . Chronic diastolic CHF (congestive heart failure) ([place]) 04/07/2014  . Solitary pulmonary nodule 04/07/2014  . COPD with exacerbation ([place]) 04/02/2014  . Hypoxia 04/02/2014  . CVA (cerebral infarction) 06/01/2012  . Chronic pain (back, legs) 05/30/2012  . Toe laceration, 4th toe 05/30/2012  . C. difficile diarrhea 05/30/2012  . Hypokalemia 05/30/2012  . Acute lacunar stroke ([place]) 05/27/2012  . DM type 2 (diabetes mellitus, type 2) ([place]) 05/27/2012  . Smoker 05/27/2012  . HTN (hypertension) 05/27/2012    Past Surgical History:  Procedure Laterality Date  . ANKLE SURGERY    . ESOPHAGOGASTRODUODENOSCOPY (EGD) WITH PROPOFOL N/A 12/21/2016   Procedure: ESOPHAGOGASTRODUODENOSCOPY (EGD) WITH PROPOFOL;  Surgeon: Daneil Dolin, MD;  [facility];  Service: Gastroenterology;  Laterality: N/A;  . ESOPHAGOGASTRODUODENOSCOPY (EGD) WITH PROPOFOL N/A 04/02/2017   Procedure: ESOPHAGOGASTRODUODENOSCOPY (EGD) WITH PROPOFOL;  Surgeon: Danie Binder, MD;  [facility];  Service: Endoscopy;  Laterality: N/A;  1030   . HERNIA REPAIR  01/05/2004   "belly button" (05/27/2012)        Home Medications    Prior to Admission medications   Medication Sig Start Date End Date Taking? Authorizing Provider  albuterol (PROVENTIL) (2.5 MG/3ML) 0.083% nebulizer solution Take 2.5 mg by nebulization every 3 (three) hours as needed for wheezing or shortness of breath.    [provider]  ALPRAZolam Duanne Moron) 1 MG tablet Take 1 tablet (1 mg total) by mouth 3 (three) times daily. Patient taking differently: Take 0.5 mg by mouth every 8 (eight) hours.  12/24/16   Arrien, Jimmy Picket, MD  amLODipine (NORVASC) 10 MG tablet Take 10 mg by mouth daily.    [provider]  ammonium lactate (LAC-HYDRIN) 5 % LOTN lotion Apply 1 application topically 2 (two) times daily.    [provider]  aspirin EC 81 MG tablet Take 81 mg by mouth daily.    [provider]  BREO ELLIPTA 100-25 MCG/INH AEPB Inhale 2 puffs into the lungs daily.  09/27/16   [provider]  calcium carbonate (TUMS - DOSED IN MG ELEMENTAL CALCIUM) 500 MG chewable tablet Chew 2 tablets by mouth 2 (two) times daily.    [provider]  cholecalciferol (VITAMIN D) 1000 units tablet Take 2,000 Units by mouth daily.     [provider]  cyanocobalamin 1000 MCG tablet Take 1,000 mcg by mouth daily.    [provider]  DULoxetine (CYMBALTA) 60 MG capsule Take 60 mg by mouth daily.    [provider]  fish oil-omega-3 fatty acids 1000 MG capsule Take 1 g by mouth 2 (two) times daily.    [provider]  gabapentin (NEURONTIN) 100 MG capsule Take 100 mg by mouth at bedtime.    [provider]  HUMALOG KWIKPEN 100 UNIT/ML KiwkPen Inject 2-14 Units into the skin 3 (three) times daily. Per sliding scale 150-200= 2 units 201-250= 4 units 251-300= 6 units 301-349= 8 units 350-400=10units 401-450=12units  451-500=14units ABOVE 501- CALL MD 10/07/16   [provider]  HYDROcodone-acetaminophen (NORCO/VICODIN) 5-325 MG tablet Take 1 tablet by mouth daily.    [provider]  insulin glargine (LANTUS) 100 UNIT/ML injection Inject 15 Units into the skin daily.    [provider]  magnesium oxide (MAG-OX) 400 MG tablet Take 400 mg by mouth daily.    [provider]  metoprolol tartrate (LOPRESSOR) 25 MG tablet Take 1 tablet (25 mg total) by mouth 2 (two) times daily. 12/24/16 04/06/19  Arrien, Jimmy Picket, MD  Multiple Vitamin (MULTIVITAMIN) tablet Take 1 tablet by mouth daily.    [provider]  pantoprazole (PROTONIX) 40 MG tablet Take 1 tablet (40 mg total) by mouth daily before breakfast. 04/02/17 04/06/19  Danie Binder, MD    Family History Family History  Problem Relation Age of Onset  . Colon cancer Neg Hx     Social History Social History   Tobacco Use  .  Smoking status: Current Every Day Smoker    Packs/day: 0.50    Years: 45.00    Pack years: 22.50    Types: Cigarettes    Last attempt to quit: 12/28/2014    Years since quitting: 4.3  . Smokeless tobacco: Never Used  . Tobacco comment: 1/2 pk per day  Substance Use Topics  . Alcohol use: No    Comment: 05/27/2012 "drank gallons and gallons 20 yr ago or so; last drink  at least 10 yr ago"  . Drug use: No     Allergies   Blueberry flavor, Cucumber extract, Flexeril [cyclobenzaprine hcl], and Kiwi extract   Review of Systems Review of Systems  Unable to perform ROS: Acuity of condition  Constitutional: Negative for fever.  Respiratory: Positive for cough, chest tightness, shortness of breath and wheezing.   Cardiovascular: Positive for chest pain and leg swelling.  Gastrointestinal: Negative for abdominal pain, diarrhea, nausea and vomiting.     Physical Exam Updated Vital Signs There were no vitals taken for this visit.  Physical Exam Vitals signs and nursing note reviewed.  Constitutional:      General: He is in acute distress.     Appearance: He is well-developed.  HENT:     Head: Normocephalic and atraumatic.     Mouth/Throat:     Pharynx: No oropharyngeal exudate.  Eyes:     General: No scleral icterus.       Right eye: No discharge.        Left eye: No discharge.     Conjunctiva/sclera: Conjunctivae normal.     Pupils: Pupils are equal, round, and reactive to light.  Neck:     Musculoskeletal: Normal range of motion and neck supple.     Thyroid: No thyromegaly.     Vascular: No JVD.  Cardiovascular:     Rate and Rhythm: Normal rate and regular rhythm.     Heart sounds: Normal heart sounds. No murmur. No friction rub. No gallop.   Pulmonary:     Effort: Tachypnea and respiratory distress present.     Breath sounds: Wheezing and rales present.     Comments: There is a prolonged expiratory phase with an expiratory wheeze, he is using accessory muscles  Abdominal:     General: Bowel sounds are normal. There is no distension.     Palpations: Abdomen is soft. There is no mass.     Tenderness: There is no abdominal tenderness.     Comments: No anasarca present  Musculoskeletal:  Right lower leg: Edema present.     Left lower leg: Edema present.     Comments: Moderate edema of the bilateral lower extremities, symmetrical, stasis dermatitis present  Lymphadenopathy:     Cervical: No cervical adenopathy.  Skin:    General: Skin is warm and dry.     Findings: Rash present. No erythema.     Comments: Stasis dermatitis present  Neurological:     Mental Status: He is alert.     Coordination: Coordination normal.  Psychiatric:        Behavior: Behavior normal.      ED Treatments / Results  Labs (all labs ordered are listed, but only abnormal results are displayed) Labs Reviewed - No data to display  EKG EKG Interpretation  Date/Time:  Monday April 20 2019 08:13:02 EST Ventricular Rate:  82 PR Interval:    QRS Duration: 120 QT Interval:  402 QTC Calculation: 470 R Axis:   -14 Text Interpretation: Sinus rhythm Nonspecific intraventricular conduction delay Baseline wander in lead(s) V3 Since last tracing rate slower Confirmed by Noemi Chapel [phone]) on 04/20/2019 8:14:44 AM   Radiology No results found.  Procedures .Critical Care Performed by: Noemi Chapel, MD Authorized by: Noemi Chapel, MD   Critical care provider statement:    Critical care time (minutes):  35   Critical care time was exclusive of:  Separately billable procedures and treating other patients and teaching time   Critical care was time spent personally by me on the following activities:  Blood draw for specimens, development of treatment plan with patient or surrogate, discussions with consultants, evaluation of patient's response to treatment, examination of patient, obtaining history from patient or surrogate, ordering and performing treatments and  interventions, ordering and review of laboratory studies, ordering and review of radiographic studies, pulse oximetry, re-evaluation of patient's condition and review of old charts   (including critical care time)  Medications Ordered in ED Medications  albuterol (VENTOLIN HFA) 108 (90 Base) MCG/ACT inhaler 4 puff (has no administration in time range)  aerochamber Z-Stat Plus/medium 1 each (has no administration in time range)  furosemide (LASIX) injection 40 mg (has no administration in time range)     Initial Impression / Assessment and Plan / ED Course  I have reviewed the triage vital signs and the nursing notes.  Pertinent labs & imaging results that were available during my care of the patient were reviewed by me and considered in my medical decision making (see chart for details).  Clinical Course as of Apr 22 748  [facility] Apr 20, 2019  0906 I have personally viewed the anterior posterior chest x-ray portable performed in the room.  The patient has some interstitial prominence however I do not see any focal infiltrates, there is no pneumothorax, there is no significant cardiomegaly out of the ordinary for this patient based on prior x-rays.  I agree with the radiologist interpretation.   [BM]  [phone] Hemoglobin is very low at 8.3 compared to a measurement of 13 albeit 2 years ago   [BM]  0937 I performed a rectal exam, the patient has normal colored stool, brown, Hemoccult positive   [BM]    Clinical Course User Index [BM] Noemi Chapel, MD       This patient is severely deconditioned, weak, this is all chronic.  That being said he does have increasing work of breathing, shortness of breath, no obvious Covid exposures at the facility where he lives and he does not really leave his  room.  His increased work of breathing could be related to fluid overload given his moderate edema though, this could also be related to COPD given the decreased lung sounds.  We will obtain a chest x-ray,  he could have pneumonia, will check for cultures, BNP, likely needs admission, likely needs diuresis.  The patient is critically ill.  ABG pending  Albuterol treatments, Solu-Medrol, Lasix  Anticipate admission to high level of care  Discussed with the hospitalist who has agreed to admit to the hospital - I appreciate there willingness to participate in the care of this patient  Eric Scott was evaluated in Emergency Department on 04/20/2019 for the symptoms described in the history of present illness. He was evaluated in the context of the global COVID-19 pandemic, which necessitated consideration that the patient might be at risk for infection with the SARS-CoV-2 virus that causes COVID-19. Institutional protocols and algorithms that pertain to the evaluation of patients at risk for COVID-19 are in a state of rapid change based on information released by regulatory bodies including the CDC and federal and state organizations. These policies and algorithms were followed during the patient's care in the ED.   Final Clinical Impressions(s) / ED Diagnoses   Final diagnoses:  Respiratory distress  COPD exacerbation ([place])  Anemia, unspecified type  Shortness of breath      Noemi Chapel, MD 04/23/19 [phone]

## 2019-04-20 NOTE — H&P (Signed)
History and Physical  Eric Scott UMP:[id number] DOB: 1950/10/14 DOA: 04/20/2019  Referring physician: Sabra Heck MD   PCP: [name], MD   Chief Complaint: shortness of breath  HPI: Eric Scott is a 68 y.o. male with a complex past medical history including diastolic congestive heart failure, CKD, COPD, diabetes mellitus with peripheral neuropathy, hyperlipidemia, chronic peripheral edema, PVD and spinal stenosis who is a Val-term resident at [facility].  He presented to ED by EMS with acute respiratory failure.  He was hypoxic on arrival.  They noticed that since last night he has had increasing shortness of breath and had been hypoxic with a pulse ox of 88%.  He does not use oxygen regularly and started complaining about wheezing coughing and shortness of breath yesterday.  He lives in the facility and does not have any known contacts with Covid positive patients and does not have a roommate.  He reports increasing edema in the legs.  He is nonambulatory.   ED course: Patient was hypoxic at 80% when he was evaluated by EMS they placed him on supplemental oxygen 6 L nasal cannula to improve his oxygen saturation to 95%.  He was noted to have bilateral edematous legs and he was speaking in short sentences and was noted to have a prolonged expiratory phase.  His respiratory rate was elevated on arrival.  He was given some albuterol treatments started on routine respiratory care.  ABG pH 7.365, PCO2 46.4, PO2 58.8.  His creatinine was 2.84, glucose 152, BUN 32, cardiac BNP 250, troponin VI, CRP 6.5, white blood cell count 11.7, hemoglobin 8.3, platelet count 301.  Lactic acid 1.4.  D-dimer 2.80, fibrinogen 719, PT 14.6, INR 1.2, fecal occult positive, SARS coronavirus tests pending, chest x-ray with right lower lobe findings of edema versus infection.   Review of Systems: All systems reviewed and apart from history of presenting illness, are negative.  Past Medical History:  Diagnosis Date  .  Anxiety   . Chronic pain    legs, back; MRI 05/2012 with mild thoracic degenerative changes no spinal stenosis   . COPD (chronic obstructive pulmonary disease) ([place])   . Depression   . Diabetic peripheral neuropathy ([facility])    "chronic" (05/27/2012)  . Diastolic CHF ([place])   . Hypercholesteremia   . Hypertension   . Peripheral edema   . PVD (peripheral vascular disease) ([place])   . Spinal stenosis    mild lumbar (MRI 05/2012)-L2-L3 to L4-L5 , mild lumbar foraminal stenosis   . Stroke ([place]) 05/2012   Subacute, lacunar infarcts within the left basal ganglia and posterior limp of the left internal capsule/thalamus; "RUE; both feet weak" (05/27/2012)  . Tremor   . Type II diabetes mellitus ([place])    Past Surgical History:  Procedure Laterality Date  . ANKLE SURGERY    . ESOPHAGOGASTRODUODENOSCOPY (EGD) WITH PROPOFOL N/A 12/21/2016   Procedure: ESOPHAGOGASTRODUODENOSCOPY (EGD) WITH PROPOFOL;  Surgeon: Daneil Dolin, MD;  Location: [facility];  Service: Gastroenterology;  Laterality: N/A;  . ESOPHAGOGASTRODUODENOSCOPY (EGD) WITH PROPOFOL N/A 04/02/2017   Procedure: ESOPHAGOGASTRODUODENOSCOPY (EGD) WITH PROPOFOL;  Surgeon: Danie Binder, MD;  Location: [facility];  Service: Endoscopy;  Laterality: N/A;  1030   . HERNIA REPAIR  01/05/2004   "belly button" (05/27/2012)   Social History:  reports that he has been smoking cigarettes. He has a 22.50 pack-year smoking history. He has never used smokeless tobacco. He reports that he does not drink alcohol or use drugs.  Allergies  Allergen Reactions  . Blueberry Flavor Other (See Comments)    Unknown  . Cucumber Extract Other (See Comments)    "Fells like I'm having a heart attack"  . Flexeril [Cyclobenzaprine Hcl] Other (See Comments)    "whole body  Tremors" (05/27/2012)  . Kiwi Extract Other (See Comments)    "feels like I'm having a heart attack"    Family History  Problem Relation Age of Onset  . Colon cancer Neg Hx     Prior  to Admission medications   Medication Sig Start Date End Date Taking? Authorizing Provider  acetaminophen (TYLENOL) 325 MG tablet Take 650 mg by mouth every 6 (six) hours as needed.   Yes [provider]  albuterol (PROVENTIL) (2.5 MG/3ML) 0.083% nebulizer solution Take 2.5 mg by nebulization every 3 (three) hours as needed for wheezing or shortness of breath.   Yes [provider]  ALPRAZolam Duanne Moron) 1 MG tablet Take 1 tablet (1 mg total) by mouth 3 (three) times daily. Patient taking differently: Take 1 mg by mouth 2 (two) times daily. Take 1/2 tablet (0.5mg ) twice daily for anxiety. 12/24/16  Yes Arrien, Jimmy Picket, MD  amLODipine (NORVASC) 10 MG tablet Take 10 mg by mouth daily.   Yes [provider]  aspirin EC 81 MG tablet Take 81 mg by mouth daily.   Yes [provider]  BREO ELLIPTA 100-25 MCG/INH AEPB Inhale 2 puffs into the lungs daily.  09/27/16  Yes [provider]  calcium carbonate (TUMS - DOSED IN MG ELEMENTAL CALCIUM) 500 MG chewable tablet Chew 2 tablets by mouth 2 (two) times daily.   Yes [provider]  cholecalciferol (VITAMIN D) 1000 units tablet Take 3,000 Units by mouth daily.    Yes [provider]  cyanocobalamin 1000 MCG tablet Take 1,000 mcg by mouth daily.   Yes [provider]  DULoxetine (CYMBALTA) 60 MG capsule Take 60 mg by mouth daily.   Yes [provider]  fish oil-omega-3 fatty acids 1000 MG capsule Take 1 g by mouth 2 (two) times daily.   Yes [provider]  furosemide (LASIX) 20 MG tablet Take 1 tablet by mouth daily. 03/31/19  Yes [provider]  furosemide (LASIX) 40 MG tablet Take 40 mg by mouth daily. 03/13/19  Yes [provider]  gabapentin (NEURONTIN) 400 MG capsule Take 400 mg by mouth every 8 (eight) hours.    Yes [provider]  HUMALOG KWIKPEN 100 UNIT/ML KiwkPen Inject 2-14 Units into the skin 3 (three) times daily. Per sliding  scale 150-200= 2 units 201-250= 4 units 251-300= 6 units 301-349= 8 units 350-400=10units 401-450=12units 451-500=14units ABOVE 501- CALL MD 10/07/16  Yes [provider]  HYDROcodone-acetaminophen (NORCO/VICODIN) 5-325 MG tablet Take 1 tablet by mouth daily.   Yes [provider]  insulin glargine (LANTUS) 100 UNIT/ML injection Inject 45 Units into the skin at bedtime.    Yes [provider]  lidocaine (LIDODERM) 5 % Place 1 patch onto the skin 2 (two) times daily. Remove & Discard patch within 12 hours or as directed by MD. Apply to lower back twice daily.   Yes [provider]  magnesium oxide (MAG-OX) 400 MG tablet Take 400 mg by mouth daily.   Yes [provider]  metoprolol tartrate (LOPRESSOR) 25 MG tablet Take 1 tablet (25 mg total) by mouth 2 (two) times daily. Patient taking differently: Take 50 mg by mouth 2 (two) times daily.  12/24/16 04/20/19 Yes Arrien, Mauricio  Quillian Quince, MD  Multiple Vitamin (MULTIVITAMIN) tablet Take 1 tablet by mouth daily.   Yes [provider]  pantoprazole (PROTONIX) 40 MG tablet Take 1 tablet (40 mg total) by mouth daily before breakfast. 04/02/17 04/20/19 Yes Danie Binder, MD   Physical Exam: Vitals:   04/20/19 1000 04/20/19 1030 04/20/19 1100 04/20/19 1200  BP: (!) 148/73 (!) 168/75 (!) 150/71 (!) 156/62  Pulse: 72 74 72 70  Resp: 12 15 12 13   Temp:      SpO2: 100% 100% 100% 99%  Weight:         General exam: Chronically ill-appearing male, appears older than stated age, lying comfortably supine on the gurney in no obvious distress.  Head, eyes and ENT: Nontraumatic and normocephalic. Pupils equally reacting to light and accommodation. Oral mucosa moist.  Neck: Supple. No JVD, carotid bruit or thyromegaly.  Lymphatics: No lymphadenopathy.  Respiratory system: Bibasilar crackles and expiratory wheezes.  Mild increased work of breathing.  Cardiovascular system: S1 and S2 heard, RRR. No  JVD, murmurs, gallops, clicks or pedal edema.  Gastrointestinal system: Abdomen is obese, nondistended, soft and nontender. Normal bowel sounds heard. No organomegaly or masses appreciated.  Central nervous system: Alert and oriented. No focal neurological deficits.  Extremities: Symmetric 5 x 5 power. Peripheral pulses symmetrically felt.  2+ pitting edema bilateral lower extremities.  Skin: No rashes or acute findings.  Musculoskeletal system: Negative exam.  Psychiatry: Pleasant and cooperative.  Labs on Admission:  Basic Metabolic Panel: Recent Labs  Lab 04/20/19 0853  NA 142  K 4.4  CL 106  CO2 24  GLUCOSE 152*  BUN 32*  CREATININE 2.84*  CALCIUM 8.4*   Liver Function Tests: Recent Labs  Lab 04/20/19 0853  AST 10*  ALT 8  ALKPHOS 77  BILITOT 0.8  PROT 7.5  ALBUMIN 3.3*   No results for input(s): LIPASE, AMYLASE in the last 168 hours. No results for input(s): AMMONIA in the last 168 hours. CBC: Recent Labs  Lab 04/20/19 0853  WBC 11.7*  NEUTROABS 9.0*  HGB 8.3*  HCT 27.4*  MCV 95.8  PLT 301   Cardiac Enzymes: No results for input(s): CKTOTAL, CKMB, CKMBINDEX, TROPONINI in the last 168 hours.  BNP (last 3 results) No results for input(s): PROBNP in the last 8760 hours. CBG: No results for input(s): GLUCAP in the last 168 hours.  Radiological Exams on Admission: Dg Chest Port 1 View  Result Date: 04/20/2019 CLINICAL DATA:  Cough, shortness of breath. Additional history provided: Diabetic, hypertension, CHF, COPD, smoker. EXAM: PORTABLE CHEST 1 VIEW COMPARISON:  Chest radiograph 11/23/2016 FINDINGS: Please note the left lateral costophrenic angle is partially excluded from the field of view. Stable cardiomegaly.  Aortic atherosclerosis. Shallow inspiration radiograph. Prominence of the interstitial markings, greatest at the right lung base. No evidence of pneumothorax or sizable pleural effusion. No acute bony abnormality IMPRESSION: Cardiomegaly.  Prominence of the interstitial markings, greatest at the right lung base. Findings may reflect edema. Infection not excluded. Electronically Signed   By: Kellie Simmering DO   On: 04/20/2019 08:43   Assessment/Plan Principal Problem:   Acute respiratory failure with hypoxia ([place]) Active Problems:   DM type 2 (diabetes mellitus, type 2) ([name])   HTN (hypertension)   Hypoxia   Chronic diastolic CHF (congestive heart failure) ([name])   PVD (peripheral vascular disease) ([name])   Generalized weakness   GERD (gastroesophageal reflux disease)   COPD with acute exacerbation ([place])  1. Acute respiratory failure with hypoxia-suspect  this is secondary to mild COPD exacerbation and will treat accordingly.  He has been started on IV steroids and IV antibiotics and will continue albuterol inhalers and supportive therapy.  Continue oxygen support.  He is already improving with supplemental oxygen.  IV Lasix has been ordered. 2. Mild acute diastolic CHF exacerbation-patient does have evidence of pulmonary edema on chest x-ray.  Hold oral Lasix and provide IV Lasix for diuresis while recovering from acute respiratory illness.  Monitor renal function closely. 3. CKD stage IIIb-creatinine currently stable from recent tests, continue to monitor closely. 4. Generalized weakness-likely secondary to chronic debility and acute illness.  Obtain PT evaluation when medically stabilized. 5. GERD-Protonix ordered for GI protection. 6. Type 2 diabetes mellitus-reordered home basal insulin, continue sliding scale insulin and CBG testing.  DVT Prophylaxis: Lovenox Code Status: Full Family Communication: Patient updated at bedside Disposition Plan: Inpatient  Time spent: 41 minutes  Clanford Wynetta Emery, MD [facility] How to contact the [facility] Attending or Consulting provider [place] or covering provider during after hours [place], for this patient?  1. Check the care team in [facility] and look for a) attending/consulting [facility]  provider listed and b) the [facility] team listed 2. Log into [email or web address] and use [place]'s universal password to access. If you do not have the password, please contact the hospital operator. 3. Locate the [facility] provider you are looking for under [facility] and page to a number that you can be directly reached. 4. If you still have difficulty reaching the provider, please page the [facility] (Director on Call) for the Hospitalists listed on amion for assistance.

## 2019-04-21 ENCOUNTER — Inpatient Hospital Stay (HOSPITAL_COMMUNITY): Payer: Medicare HMO

## 2019-04-21 LAB — CBC WITH DIFFERENTIAL/PLATELET
Abs Immature Granulocytes: 0.1 10*3/uL — ABNORMAL HIGH (ref 0.00–0.07)
Basophils Absolute: 0 10*3/uL (ref 0.0–0.1)
Basophils Relative: 0 %
Eosinophils Absolute: 0 10*3/uL (ref 0.0–0.5)
Eosinophils Relative: 0 %
HCT: 26.6 % — ABNORMAL LOW (ref 39.0–52.0)
Hemoglobin: 8 g/dL — ABNORMAL LOW (ref 13.0–17.0)
Immature Granulocytes: 1 %
Lymphocytes Relative: 7 %
Lymphs Abs: 0.6 10*3/uL — ABNORMAL LOW (ref 0.7–4.0)
MCH: 28.8 pg (ref 26.0–34.0)
MCHC: 30.1 g/dL (ref 30.0–36.0)
MCV: 95.7 fL (ref 80.0–100.0)
Monocytes Absolute: 0.2 10*3/uL (ref 0.1–1.0)
Monocytes Relative: 2 %
Neutro Abs: 7.7 10*3/uL (ref 1.7–7.7)
Neutrophils Relative %: 90 %
Platelets: 273 10*3/uL (ref 150–400)
RBC: 2.78 MIL/uL — ABNORMAL LOW (ref 4.22–5.81)
RDW: 17.2 % — ABNORMAL HIGH (ref 11.5–15.5)
WBC: 8.6 10*3/uL (ref 4.0–10.5)
nRBC: 0 % (ref 0.0–0.2)

## 2019-04-21 LAB — COMPREHENSIVE METABOLIC PANEL
ALT: 8 U/L (ref 0–44)
AST: 10 U/L — ABNORMAL LOW (ref 15–41)
Albumin: 3.4 g/dL — ABNORMAL LOW (ref 3.5–5.0)
Alkaline Phosphatase: 71 U/L (ref 38–126)
Anion gap: 10 (ref 5–15)
BUN: 44 mg/dL — ABNORMAL HIGH (ref 8–23)
CO2: 25 mmol/L (ref 22–32)
Calcium: 8.8 mg/dL — ABNORMAL LOW (ref 8.9–10.3)
Chloride: 105 mmol/L (ref 98–111)
Creatinine, Ser: 3.02 mg/dL — ABNORMAL HIGH (ref 0.61–1.24)
GFR calc Af Amer: 23 mL/min — ABNORMAL LOW (ref >60)
GFR calc non Af Amer: 20 mL/min — ABNORMAL LOW (ref >60)
Glucose, Bld: 291 mg/dL — ABNORMAL HIGH (ref 70–99)
Potassium: 5.8 mmol/L — ABNORMAL HIGH (ref 3.5–5.1)
Sodium: 140 mmol/L (ref 135–145)
Total Bilirubin: 0.6 mg/dL (ref 0.3–1.2)
Total Protein: 7.4 g/dL (ref 6.5–8.1)

## 2019-04-21 LAB — HIV ANTIBODY (ROUTINE TESTING W REFLEX): HIV Screen 4th Generation wRfx: NONREACTIVE — AB

## 2019-04-21 LAB — BASIC METABOLIC PANEL
Anion gap: 12 (ref 5–15)
BUN: 48 mg/dL — ABNORMAL HIGH (ref 8–23)
CO2: 23 mmol/L (ref 22–32)
Calcium: 8.7 mg/dL — ABNORMAL LOW (ref 8.9–10.3)
Chloride: 105 mmol/L (ref 98–111)
Creatinine, Ser: 3.08 mg/dL — ABNORMAL HIGH (ref 0.61–1.24)
GFR calc Af Amer: 23 mL/min — ABNORMAL LOW (ref >60)
GFR calc non Af Amer: 20 mL/min — ABNORMAL LOW (ref >60)
Glucose, Bld: 181 mg/dL — ABNORMAL HIGH (ref 70–99)
Potassium: 5.1 mmol/L (ref 3.5–5.1)
Sodium: 140 mmol/L (ref 135–145)

## 2019-04-21 LAB — GLUCOSE, CAPILLARY
Glucose-Capillary: 317 mg/dL — ABNORMAL HIGH (ref 70–99)
Glucose-Capillary: 186 mg/dL — ABNORMAL HIGH (ref 70–99)
Glucose-Capillary: 151 mg/dL — ABNORMAL HIGH (ref 70–99)
Glucose-Capillary: 166 mg/dL — ABNORMAL HIGH (ref 70–99)

## 2019-04-21 LAB — URINE CULTURE

## 2019-04-21 LAB — MAGNESIUM: Magnesium: 2.5 mg/dL — ABNORMAL HIGH (ref 1.7–2.4)

## 2019-04-21 LAB — CBG MONITORING, ED: Glucose-Capillary: 241 mg/dL — ABNORMAL HIGH (ref 70–99)

## 2019-04-21 MED ORDER — ENOXAPARIN SODIUM 40 MG/0.4ML ~~LOC~~ SOLN
40.0000 mg | SUBCUTANEOUS | Status: DC
  Administered 2019-04-21: 40 mg via SUBCUTANEOUS
  Filled 2019-04-21: qty 0.4

## 2019-04-21 MED ORDER — SODIUM POLYSTYRENE SULFONATE 15 GM/60ML PO SUSP
30.0000 g | Freq: Once | ORAL | Status: AC
  Administered 2019-04-21: 30 g via ORAL
  Filled 2019-04-21: qty 120

## 2019-04-21 MED ORDER — INSULIN ASPART 100 UNIT/ML ~~LOC~~ SOLN
12.0000 [IU] | Freq: Three times a day (TID) | SUBCUTANEOUS | Status: DC
  Administered 2019-04-21 – 2019-04-22 (×5): 12 [IU] via SUBCUTANEOUS

## 2019-04-21 MED ORDER — DOXYCYCLINE HYCLATE 100 MG PO TABS
100.0000 mg | ORAL_TABLET | Freq: Two times a day (BID) | ORAL | Status: DC
  Administered 2019-04-22: 100 mg via ORAL
  Filled 2019-04-21: qty 1

## 2019-04-21 MED ORDER — METHYLPREDNISOLONE SODIUM SUCC 40 MG IJ SOLR
40.0000 mg | Freq: Two times a day (BID) | INTRAMUSCULAR | Status: DC
  Administered 2019-04-21 – 2019-04-22 (×3): 40 mg via INTRAVENOUS
  Filled 2019-04-21 (×3): qty 1

## 2019-04-21 NOTE — Progress Notes (Signed)
PROGRESS NOTE [facility]   STEPHONE GUM  VEH:[id number]  DOB: 05/04/1951  DOA: 04/20/2019 PCP: [name], MD   Brief Admission Hx: 68 y.o. male with a complex past medical history including diastolic congestive heart failure, CKD, COPD, diabetes mellitus with peripheral neuropathy, hyperlipidemia, chronic peripheral edema, PVD and spinal stenosis who is a [place]-term resident at [facility].  He presented to ED by EMS with acute respiratory failure.  He was hypoxic on arrival.   He was noted to have bilateral edematous legs and he was speaking in short sentences and was noted to have a prolonged expiratory phase.  His respiratory rate was elevated on arrival.  He was given some albuterol treatments started on routine respiratory care.  MDM/Assessment & Plan:   1. Acute respiratory failure with hypoxia-clinically improving with treatments, I suspect this is secondary to mild COPD and CHF exacerbation and will treat accordingly.  He has been started on IV steroids and antibiotics and will continue albuterol inhalers and supportive therapy.  Continue oxygen support.  He is already improving with supplemental oxygen.  IV Lasix has been ordered for gentle diuresis.  2. Mild acute diastolic CHF exacerbation-patient does have evidence of pulmonary edema on chest x-ray.  Hold oral Lasix and provide IV Lasix for diuresis while recovering from acute respiratory illness.  Monitor renal function closely. 3. Hyperkalemia - I gave him a dose of kayexalate and rechecked BMP with normalization of potassium levels. Follow.  4. CKD stage IIIb-creatinine currently stable from recent tests, continue to monitor closely with ongoing diuresis. 5. Generalized weakness-likely secondary to chronic debility and acute illness.  Obtain PT evaluation. He is likely to return to SNF [facility]).  6. GERD-Protonix ordered for GI protection. 7. Type 2 diabetes mellitus-reordered home basal insulin, continue sliding  scale insulin and CBG testing. 8. BLEs - [place] both legs negative for DVT.  9. Elevated D dimer - V/Q scan does NOT suggest PE.    DVT Prophylaxis: Lovenox Code Status: Full Family Communication: Patient updated at bedside Disposition Plan: Inpatient  Consultants:    Procedures:    Antimicrobials:     Subjective: Pt reports that he is having much less SOB today.  He denies chest pain.    Objective: Vitals:   04/21/19 0715 04/21/19 0800 04/21/19 0846 04/21/19 1356  BP: (!) 163/73 (!) 179/87  140/73  Pulse: 70 79  75  Resp: 19 20  19   Temp: 97.7 F (36.5 C) 98 F (36.7 C)  98.5 F (36.9 C)  TempSrc: Oral Oral  Oral  SpO2: 96% 96% 97% 100%  Weight: (!) 139.7 kg     Height:  6' (1.829 m)      Intake/Output Summary (Last 24 hours) at 04/21/2019 1529 Last data filed at 04/21/2019 1357 Gross per 24 hour  Intake 500 ml  Output 1061 ml  Net -561 ml   Filed Weights   04/20/19 0811 04/21/19 0715  Weight: (!) 137.4 kg (!) 139.7 kg     REVIEW OF SYSTEMS  As per history otherwise all reviewed and reported negative  Exam:  General exam: chronically ill appearing male, sitting in bed in no distress.  Respiratory system: bibasilar exp wheezes and crackles heard. No increased work of breathing. Cardiovascular system: S1 & S2 heard. No JVD, murmurs, gallops, clicks or pedal edema. Gastrointestinal system: Abdomen is nondistended, soft and nontender. Normal bowel sounds heard. Central nervous system: Alert and oriented. No focal neurological deficits. Extremities: 2+ pitting edema BLEs.  Data Reviewed: Basic Metabolic Panel: Recent Labs  Lab 04/20/19 0853 04/21/19 0522 04/21/19 1228  NA 142 140 140  K 4.4 5.8* 5.1  CL 106 105 105  CO2 24 25 23   GLUCOSE 152* 291* 181*  BUN 32* 44* 48*  CREATININE 2.84* 3.02* 3.08*  CALCIUM 8.4* 8.8* 8.7*  MG  --  2.5*  --    Liver Function Tests: Recent Labs  Lab 04/20/19 0853 04/21/19 0522  AST 10* 10*  ALT 8 8    ALKPHOS 77 71  BILITOT 0.8 0.6  PROT 7.5 7.4  ALBUMIN 3.3* 3.4*   No results for input(s): LIPASE, AMYLASE in the last 168 hours. No results for input(s): AMMONIA in the last 168 hours. CBC: Recent Labs  Lab 04/20/19 0853 04/21/19 0522  WBC 11.7* 8.6  NEUTROABS 9.0* 7.7  HGB 8.3* 8.0*  HCT 27.4* 26.6*  MCV 95.8 95.7  PLT 301 273   Cardiac Enzymes: No results for input(s): CKTOTAL, CKMB, CKMBINDEX, TROPONINI in the last 168 hours. CBG (last 3)  Recent Labs    04/21/19 0359 04/21/19 0755 04/21/19 1121  GLUCAP 241* 317* 186*   Recent Results (from the past 240 hour(s))  Urine culture     Status: Abnormal   Collection Time: 04/20/19  8:13 AM   Specimen: In/Out Cath Urine  Result Value Ref Range Status   Specimen Description   Final    IN/OUT CATH URINE Performed at [facility], [address] [place], [place] [address]    Special Requests   Final    NONE Performed at [facility], [address] [place], [place] [address]    Culture MULTIPLE SPECIES PRESENT, SUGGEST RECOLLECTION (A)  Final   Report Status 04/21/2019 FINAL  Final  Blood Culture (routine x 2)     Status: None (Preliminary result)   Collection Time: 04/20/19  8:37 AM   Specimen: BLOOD RIGHT HAND  Result Value Ref Range Status   Specimen Description   Final    BLOOD RIGHT HAND BOTTLES DRAWN AEROBIC AND ANAEROBIC   Special Requests   Final    Blood Culture results may not be optimal due to an excessive volume of blood received in culture bottles   Culture   Final    NO GROWTH < 24 HOURS Performed at [facility], [address], [place], [place] [address]    Report Status PENDING  Incomplete  Blood Culture (routine x 2)     Status: None (Preliminary result)   Collection Time: 04/20/19  8:58 AM   Specimen: Right Antecubital; Blood  Result Value Ref Range Status   Specimen Description   Final    RIGHT ANTECUBITAL BOTTLES DRAWN AEROBIC AND ANAEROBIC   Special Requests   Final    Blood Culture  results may not be optimal due to an excessive volume of blood received in culture bottles   Culture   Final    NO GROWTH < 24 HOURS Performed at [facility], [address], [place],  [address]    Report Status PENDING  Incomplete  SARS CORONAVIRUS 2 (TAT 6-24 HRS) Nasopharyngeal Nasopharyngeal Swab     Status: None   Collection Time: 04/20/19  9:26 AM   Specimen: Nasopharyngeal Swab  Result Value Ref Range Status   SARS Coronavirus 2 NEGATIVE NEGATIVE Final    Comment: (NOTE) SARS-CoV-2 target nucleic acids are NOT DETECTED. The SARS-CoV-2 RNA is generally detectable in upper and lower respiratory specimens during the acute phase of infection.  Negative results do not preclude SARS-CoV-2 infection, do not rule out co-infections with other pathogens, and should not be used as the sole basis for treatment or other patient management decisions. Negative results must be combined with clinical observations, patient history, and epidemiological information. The expected result is Negative. Fact Sheet for Patients: SugarRoll.be Fact Sheet for Healthcare Providers: https://www.woods-mathews.com/ This test is not yet approved or cleared by the [place] FDA and  has been authorized for detection and/or diagnosis of SARS-CoV-2 by FDA under an Emergency Use Authorization (EUA). This EUA will remain  in effect (meaning this test can be used) for the duration of the COVID-19 declaration under Section 56 4(b)(1) of the Act, 21 U.S.C. section 360bbb-3(b)(1), unless the authorization is terminated or revoked sooner. Performed at [facility], [place] [address] [place], [place] [address]      Studies: Nm Pulmonary Perf And Vent  Result Date: 04/20/2019 CLINICAL DATA:  PE suspected, intermediate probability, positive D-dimer. EXAM: NUCLEAR MEDICINE VENTILATION - PERFUSION LUNG SCAN TECHNIQUE: Ventilation images were obtained in multiple  projections using inhaled aerosol Tc-4m DTPA. Perfusion images were obtained in multiple projections after intravenous injection of Tc-72m MAA. RADIOPHARMACEUTICALS:  1.5 mCi Tc55m MAA IV COMPARISON:  One-view chest x-ray 04/20/2019 FINDINGS: Ventilation: Mild performed Perfusion: No wedge shaped peripheral perfusion defects to suggest acute pulmonary embolism. IMPRESSION: Negative perfusion scan.  No evidence for pulmonary embolus. Electronically Signed   By: San Morelle M.D.   On: 04/20/2019 14:44   US Venous Img Lower Bilateral  Result Date: 04/20/2019 CLINICAL DATA:  68 year old male with bilateral lower extremity edema EXAM: BILATERAL LOWER EXTREMITY VENOUS DOPPLER ULTRASOUND TECHNIQUE: Gray-scale sonography with graded compression, as well as color Doppler and duplex ultrasound were performed to evaluate the lower extremity deep venous systems from the level of the common femoral vein and including the common femoral, femoral, profunda femoral, popliteal and calf veins including the posterior tibial, peroneal and gastrocnemius veins when visible. The superficial great saphenous vein was also interrogated. Spectral Doppler was utilized to evaluate flow at rest and with distal augmentation maneuvers in the common femoral, femoral and popliteal veins. COMPARISON:  None. FINDINGS: RIGHT LOWER EXTREMITY Common Femoral Vein: No evidence of thrombus. Normal compressibility, respiratory phasicity and response to augmentation. Saphenofemoral Junction: No evidence of thrombus. Normal compressibility and flow on color Doppler imaging. Profunda Femoral Vein: No evidence of thrombus. Normal compressibility and flow on color Doppler imaging. Femoral Vein: No evidence of thrombus. Normal compressibility, respiratory phasicity and response to augmentation. Popliteal Vein: No evidence of thrombus. Normal compressibility, respiratory phasicity and response to augmentation. Calf Veins: No evidence of thrombus.  Normal compressibility and flow on color Doppler imaging. Superficial Great Saphenous Vein: No evidence of thrombus. Normal compressibility. Venous Reflux:  None. Other Findings: Superficial subcutaneous edema noted throughout the calves. LEFT LOWER EXTREMITY Common Femoral Vein: No evidence of thrombus. Normal compressibility, respiratory phasicity and response to augmentation. Saphenofemoral Junction: No evidence of thrombus. Normal compressibility and flow on color Doppler imaging. Profunda Femoral Vein: No evidence of thrombus. Normal compressibility and flow on color Doppler imaging. Femoral Vein: No evidence of thrombus. Normal compressibility, respiratory phasicity and response to augmentation. Popliteal Vein: No evidence of thrombus. Normal compressibility, respiratory phasicity and response to augmentation. Calf Veins: No evidence of thrombus. Normal compressibility and flow on color Doppler imaging. Superficial Great Saphenous Vein: No evidence of thrombus. Normal compressibility. Venous Reflux:  None. Other Findings: Superficial subcutaneous edema noted throughout the calves. IMPRESSION: No evidence  of deep venous thrombosis. Electronically Signed   By: Jacqulynn Cadet M.D.   On: 04/20/2019 16:08   Portable Chest 1 View  Result Date: 04/21/2019 CLINICAL DATA:  Respiratory failure. EXAM: PORTABLE CHEST 1 VIEW COMPARISON:  Radiograph yesterday. CT 07/20/2014 FINDINGS: Cardiomegaly. Unchanged mediastinal contours. Small left pleural effusion. 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 Improved bronchitic and interstitial markings. No confluent airspace disease or pneumothorax. IMPRESSION: 1. Stable cardiomegaly.  Small left pleural effusion. 2. Improving bronchitic and interstitial markings may represent improving pulmonary edema or bronchial inflammation. 3. 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  Electronically Signed   By: Keith Rake M.D.   On: 04/21/2019 05:16   Dg Chest Port 1 View  Result Date: 04/20/2019 CLINICAL DATA:  Cough, shortness of breath. Additional history provided: Diabetic, hypertension, CHF, COPD, smoker. EXAM: PORTABLE CHEST 1 VIEW COMPARISON:  Chest radiograph 11/23/2016 FINDINGS: Please note the left lateral costophrenic angle is partially excluded from the field of view. Stable cardiomegaly.  Aortic atherosclerosis. Shallow inspiration radiograph. Prominence of the interstitial markings, greatest at the right lung base. No evidence of pneumothorax or sizable pleural effusion. No acute bony abnormality IMPRESSION: Cardiomegaly. Prominence of the interstitial markings, greatest at the right lung base. Findings may reflect edema. Infection not excluded. Electronically Signed   By: Kellie Simmering DO   On: 04/20/2019 08:43     Scheduled Meds:  amLODipine  10 mg Oral Daily   aspirin EC  81 mg Oral Daily   calcium carbonate  2 tablet Oral BID   cholecalciferol  3,000 Units Oral Daily   DULoxetine  60 mg Oral Daily   enoxaparin (LOVENOX) injection  40 mg Subcutaneous Q24H   fluticasone furoate-vilanterol  2 puff Inhalation Daily   furosemide  40 mg Intravenous Daily   gabapentin  400 mg Oral Q8H   insulin aspart  0-15 Units Subcutaneous TID WC   insulin aspart  0-5 Units Subcutaneous QHS   insulin aspart  12 Units Subcutaneous TID WC   insulin glargine  45 Units Subcutaneous QHS   lidocaine  1 patch Transdermal BID   magnesium oxide  400 mg Oral Daily   methylPREDNISolone (SOLU-MEDROL) injection  40 mg Intravenous Q12H   metoprolol tartrate  50 mg Oral BID   multivitamin with minerals  1 tablet Oral Daily   nicotine  14 mg Transdermal Daily   omega-3 acid ethyl esters  1 g Oral BID   pantoprazole  40 mg Oral QAC breakfast   sodium chloride flush  3 mL Intravenous Q12H   cyanocobalamin  1,000 mcg Oral Daily   Continuous Infusions:   sodium chloride 10 mL (04/20/19 1806)   doxycycline (VIBRAMYCIN) IV Stopped (04/21/19 0445)    Principal Problem:   Acute respiratory failure with hypoxia ([name]) Active Problems:   DM type 2 (diabetes mellitus, type 2) ([name])   HTN (hypertension)   Hypoxia   Chronic diastolic CHF (congestive heart failure) ([name])   PVD (peripheral vascular disease) ([name])   Generalized weakness   GERD (gastroesophageal reflux disease)   COPD with acute exacerbation ([place])   Time spent:   Irwin Brakeman, MD [facility] 04/21/2019, 3:29 PM    LOS: 1 day  How to contact the [facility] Attending or Consulting provider [place] or covering provider during after hours [place], for this patient?  1. Check the care  team in [facility] and look for a) attending/consulting [place] provider listed and b) the [facility] team listed 2. Log into [email or web address] and use [place]'s universal password to access. If you do not have the password, please contact the hospital operator. 3. Locate the [name] provider you are looking for under [facility] and page to a number that you can be directly reached. 4. If you still have difficulty reaching the provider, please page the [facility] (Director on Call) for the Hospitalists listed on amion for assistance.

## 2019-04-22 DIAGNOSIS — R2681 Unsteadiness on feet: Secondary | ICD-10-CM | POA: Diagnosis not present

## 2019-04-22 DIAGNOSIS — M6281 Muscle weakness (generalized): Secondary | ICD-10-CM | POA: Diagnosis not present

## 2019-04-22 DIAGNOSIS — J9601 Acute respiratory failure with hypoxia: Secondary | ICD-10-CM | POA: Diagnosis not present

## 2019-04-22 LAB — CBC WITH DIFFERENTIAL/PLATELET
Abs Immature Granulocytes: 0.23 10*3/uL — ABNORMAL HIGH (ref 0.00–0.07)
Basophils Absolute: 0 10*3/uL (ref 0.0–0.1)
Basophils Relative: 0 %
Eosinophils Absolute: 0 10*3/uL (ref 0.0–0.5)
Eosinophils Relative: 0 %
HCT: 27.5 % — ABNORMAL LOW (ref 39.0–52.0)
Hemoglobin: 8.2 g/dL — ABNORMAL LOW (ref 13.0–17.0)
Immature Granulocytes: 2 %
Lymphocytes Relative: 6 %
Lymphs Abs: 0.7 10*3/uL (ref 0.7–4.0)
MCH: 29.1 pg (ref 26.0–34.0)
MCHC: 29.8 g/dL — ABNORMAL LOW (ref 30.0–36.0)
MCV: 97.5 fL (ref 80.0–100.0)
Monocytes Absolute: 0.3 10*3/uL (ref 0.1–1.0)
Monocytes Relative: 3 %
Neutro Abs: 10.1 10*3/uL — ABNORMAL HIGH (ref 1.7–7.7)
Neutrophils Relative %: 89 %
Platelets: 288 10*3/uL (ref 150–400)
RBC: 2.82 MIL/uL — ABNORMAL LOW (ref 4.22–5.81)
RDW: 17.3 % — ABNORMAL HIGH (ref 11.5–15.5)
WBC: 11.3 10*3/uL — ABNORMAL HIGH (ref 4.0–10.5)
nRBC: 0 % (ref 0.0–0.2)

## 2019-04-22 LAB — RENAL FUNCTION PANEL
Albumin: 3.5 g/dL (ref 3.5–5.0)
Anion gap: 12 (ref 5–15)
BUN: 60 mg/dL — ABNORMAL HIGH (ref 8–23)
CO2: 25 mmol/L (ref 22–32)
Calcium: 8.7 mg/dL — ABNORMAL LOW (ref 8.9–10.3)
Chloride: 106 mmol/L (ref 98–111)
Creatinine, Ser: 3.1 mg/dL — ABNORMAL HIGH (ref 0.61–1.24)
GFR calc Af Amer: 23 mL/min — ABNORMAL LOW (ref >60)
GFR calc non Af Amer: 20 mL/min — ABNORMAL LOW (ref >60)
Glucose, Bld: 226 mg/dL — ABNORMAL HIGH (ref 70–99)
Phosphorus: 4.5 mg/dL (ref 2.5–4.6)
Potassium: 4.7 mmol/L (ref 3.5–5.1)
Sodium: 143 mmol/L (ref 135–145)

## 2019-04-22 LAB — MRSA PCR SCREENING: MRSA by PCR: NEGATIVE

## 2019-04-22 LAB — GLUCOSE, CAPILLARY
Glucose-Capillary: 196 mg/dL — ABNORMAL HIGH (ref 70–99)
Glucose-Capillary: 254 mg/dL — ABNORMAL HIGH (ref 70–99)

## 2019-04-22 LAB — MAGNESIUM: Magnesium: 2.5 mg/dL — ABNORMAL HIGH (ref 1.7–2.4)

## 2019-04-22 MED ORDER — DOXYCYCLINE HYCLATE 100 MG PO TABS: 100.0000 mg | ORAL_TABLET | Freq: Two times a day (BID) | ORAL | 0 refills | Status: AC

## 2019-04-22 MED ORDER — PREDNISONE 20 MG PO TABS: 40.0000 mg | ORAL_TABLET | Freq: Every day | ORAL | 0 refills | Status: AC

## 2019-04-22 NOTE — Evaluation (Signed)
Physical Therapy Evaluation Patient Details Name: Eric Scott MRN: [id number] DOB: 03/03/1951 Today's Date: 04/22/2019   History of Present Illness  Eric Scott is a 68 y.o. male with a complex past medical history including diastolic congestive heart failure, CKD, COPD, diabetes mellitus with peripheral neuropathy, hyperlipidemia, chronic peripheral edema, PVD and spinal stenosis who is a [place]-term resident at [facility].  He presented to ED by EMS with acute respiratory failure.  He was hypoxic on arrival.  They noticed that since last night he has had increasing shortness of breath and had been hypoxic with a pulse ox of 88%.  He does not use oxygen regularly and started complaining about wheezing coughing and shortness of breath yesterday.  He lives in the facility and does not have any known contacts with Covid positive patients and does not have a roommate.  He reports increasing edema in the legs.  He is nonambulatory.    Clinical Impression  Patient unable to stand due to BLE weakness and demonstrates poor return for attempting to laterally scoot at bedside due to generalized weakness.  Patient put back to bed requiring Min/mod assist to reposition with bed in head down position.  Plan:  Patient to bed discharged from hospital today and patient discharged from physical therapy to care of nursing for OOB with use of mechanical lift for length of stay.    Follow Up Recommendations SNF    Equipment Recommendations  None recommended by PT    Recommendations for Other Services       Precautions / Restrictions Precautions Precautions: Fall Restrictions Weight Bearing Restrictions: No      Mobility  Bed Mobility Overal bed mobility: Needs Assistance Bed Mobility: Supine to Sit     Supine to sit: Min guard     General bed mobility comments: head of bed raised, use of bed rail  Transfers                    Ambulation/Gait                Stairs             Wheelchair Mobility    Modified Rankin (Stroke Patients Only)       Balance Overall balance assessment: Needs assistance Sitting-balance support: Feet supported;No upper extremity supported Sitting balance-Leahy Scale: Good Sitting balance - Comments: sitting at EOB                                     Pertinent Vitals/Pain Pain Assessment: No/denies pain    Home Living Family/patient expects to be discharged to:: Skilled nursing facility                      Prior Function Level of Independence: Needs assistance   Gait / Transfers Assistance Needed: Assisted transfers, uses power wc  ADL's / Homemaking Assistance Needed: assisted by SNF staff        Hand Dominance   Dominant Hand: Right    Extremity/Trunk Assessment   Upper Extremity Assessment Upper Extremity Assessment: Generalized weakness    Lower Extremity Assessment Lower Extremity Assessment: Generalized weakness    Cervical / Trunk Assessment Cervical / Trunk Assessment: Normal  Communication   Communication: No difficulties  Cognition Arousal/Alertness: Awake/alert Behavior During Therapy: WFL for tasks assessed/performed Overall Cognitive Status: Within Functional Limits for tasks assessed                                          General Comments      Exercises     Assessment/Plan    PT Assessment All further PT needs can be met in the next venue of care  PT Problem List Decreased strength;Decreased activity tolerance;Decreased balance;Decreased mobility       PT Treatment Interventions      PT Goals (Current goals can be found in the Care Plan section)  Acute Rehab PT Goals Patient Stated Goal: Return to SNF with their staff to assist PT Goal Formulation: With patient Time For Goal Achievement: 04/22/19 Potential to Achieve Goals: Good    Frequency     Barriers to discharge        Co-evaluation               AM-PAC PT  "6 Clicks" Mobility  Outcome Measure Help needed turning from your back to your side while in a flat bed without using bedrails?: None Help needed moving from lying on your back to sitting on the side of a flat bed without using bedrails?: A Little Help needed moving to and from a bed to a chair (including a wheelchair)?: Total Help needed standing up from a chair using your arms (e.g., wheelchair or bedside chair)?: Total Help needed to walk in hospital room?: Total Help needed climbing 3-5 steps with a railing? : Total 6 Click Score: 11    End of Session Equipment Utilized During Treatment: Oxygen Activity Tolerance: Patient tolerated treatment well;Patient limited by fatigue Patient left: in bed;with call bell/phone within reach Nurse Communication: Mobility status PT Visit Diagnosis: Unsteadiness on feet (R26.81);Other abnormalities of gait and mobility (R26.89);Muscle weakness (generalized) (M62.81)    Time: 5449-2010 PT Time Calculation (min) (ACUTE ONLY): 22 min   Charges:   PT Evaluation $PT Eval Moderate Complexity: 1 Mod PT Treatments $Therapeutic Activity: 8-22 mins        11:58 AM, 04/22/19 Lonell Grandchild, MPT Physical Therapist with [facility] [phone] office [phone] mobile phone

## 2019-04-22 NOTE — TOC Transition Note (Signed)
Transition of Care [facility]) - CM/SW Discharge Note   Patient Details  Name: Eric Scott MRN: [id number] Date of Birth: Oct 17, 1950  [facility] CM/SW Contact:  Zarin Hagmann, Chauncey Reading, RN Phone Number: 04/22/2019, 11:33 AM   Clinical Narrative:   Patient to DC back to [facility]. Discussed with Jackelyn Poling, patient will need to be on rehab side for PT, Jackelyn Poling will start authorization, but patient can transfer back today. Will require oxygen, Debbie notified. Clinicals sent, will upload PT notes when available.        Expected Discharge Date: 04/22/19                     Activities of Daily Living Home Assistive Devices/Equipment: Wheelchair, CBG Meter, Bedside commode/3-in-1 ADL Screening (condition at time of admission) Patient's cognitive ability adequate to safely complete daily activities?: Yes Is the patient deaf or have difficulty hearing?: No Does the patient have difficulty seeing, even when wearing glasses/contacts?: No Does the patient have difficulty concentrating, remembering, or making decisions?: No Patient able to express need for assistance with ADLs?: Yes Does the patient have difficulty dressing or bathing?: Yes Independently performs ADLs?: No Communication: Needs assistance Is this a change from baseline?: Pre-admission baseline Dressing (OT): Needs assistance Is this a change from baseline?: Pre-admission baseline Grooming: Needs assistance Is this a change from baseline?: Pre-admission baseline Feeding: Needs assistance Is this a change from baseline?: Pre-admission baseline Bathing: Needs assistance Is this a change from baseline?: Pre-admission baseline Toileting: Dependent Is this a change from baseline?: Pre-admission baseline In/Out Bed: Needs assistance Is this a change from baseline?: Pre-admission baseline Walks in Home: Dependent Is this a change from baseline?: Pre-admission baseline Does the patient have difficulty walking or  climbing stairs?: Yes Weakness of Legs: Both Weakness of Arms/Hands: Both   Admission diagnosis:  Shortness of breath [R06.02] Respiratory distress [R06.03] COPD exacerbation ([place]) [J44.1] Bilateral leg edema [R60.0] Anemia, unspecified type [D64.9] Acute respiratory failure with hypoxia ([place]) [J96.01] Patient Active Problem List   Diagnosis Date Noted  . COPD with acute exacerbation ([place]) 04/20/2019  . GERD (gastroesophageal reflux disease) 08/08/2018  . PUD (peptic ulcer disease)   . Gastric erosions 02/13/2017  . Duodenal ulcer 02/13/2017  . Transaminitis   . UGI bleed   . Arm DVT (deep venous thromboembolism), acute, right ([place]) 12/18/2016  . Uremia 12/10/2016  . Acute renal failure ([place])   . Volume depletion 11/23/2016  . Hypotension   . Liver enzyme elevation   . 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 Arterial insufficiency of lower extremity ([place]) 11/15/2016  . Pressure injury of skin 11/09/2016  . Sepsis ([place]) 11/09/2016  . Osteomyelitis ([name]) 11/09/2016  . Wounds, multiple 11/08/2016  . Chronic venous insufficiency 10/12/2016  . Critical lower limb ischemia 10/12/2016  . Fatty liver 09/10/2016  . History of colonic polyps 09/10/2016  . Gallstone 09/10/2016  . Edema 03/04/2015  . AKI (acute kidney injury) ([place]) 03/04/2015  . Fall 03/04/2015  . Generalized weakness 03/04/2015  . Diabetic polyneuropathy associated with type 2 diabetes mellitus ([place])   . Anxiety   . PVD (peripheral vascular disease) ([place])   . Pneumonia 07/20/2014  . Weight gain 05/02/2014  . Coarse tremors 04/18/2014  . Anxiety state 04/18/2014  . Acute respiratory failure with hypoxia ([place]) 04/07/2014  . Chronic diastolic CHF (congestive heart failure) ([place]) 04/07/2014  . Solitary pulmonary nodule 04/07/2014  . COPD with exacerbation ([place])  04/02/2014  . Hypoxia 04/02/2014  . CVA (cerebral infarction) 06/01/2012  .  Chronic pain (back, legs) 05/30/2012  . Toe laceration, 4th toe 05/30/2012  . C. difficile diarrhea 05/30/2012  . Hypokalemia 05/30/2012  . Acute lacunar stroke ([place]) 05/27/2012  . DM type 2 (diabetes mellitus, type 2) ([place]) 05/27/2012  . Smoker 05/27/2012  . HTN (hypertension) 05/27/2012   PCP:  [name], MD Pharmacy:   [facility] [address] [place] [address] Phone: [phone] Fax: [phone]  [facility] [place], [place] [address] [place] [address] Phone: [phone] Fax: [phone]        Readmission Risk Interventions Readmission Risk Prevention Plan 04/22/2019  Transportation Screening Complete  Medication Review (RN Care Manager) Complete  PCP or Specialist appointment within 3-5 days of discharge Not Complete  PCP/Specialist Appt Not Complete comments SNF MD  [place] or [place] Not Complete  HRI or Home Care Consult Pt Refusal Comments from SNF  SW Recovery Care/Counseling Consult Complete  Palliative Care Screening Not Applicable  [place] Complete  Some recent data might be hidden

## 2019-04-22 NOTE — Discharge Summary (Signed)
Physician Discharge Summary  Eric Scott UVO:[id number] DOB: 09/02/1950 DOA: 04/20/2019  PCP: [name], MD  Admit date: 04/20/2019  Discharge date: 04/22/2019  Admitted From:SNF  Disposition:  SNF  Recommendations for Outpatient Follow-up:  1. Follow up with PCP in 1-2 weeks 2. Continue on prednisone and doxycycline as prescribed for 5 more days 3. Continue duo nebs as needed for shortness of breath or wheezing at home 4. Continue home Lasix  Home Health: None, continue PT at SNF  Equipment/Devices: 2 L nasal cannula oxygen  Discharge Condition: Stable  CODE STATUS: Full  Diet recommendation: Heart Healthy/carb modified  Brief/Interim Summary: 68 y.o.malewith a complex past medical history includingdiastoliccongestive heart failure,CKD,COPD, diabetes mellitus with peripheral neuropathy, hyperlipidemia, chronic peripheral edema, PVD and spinal stenosis who is a [facility]-term resident at [facility]. He presented to ED by EMS with acute respiratory failure. He was hypoxic on arrival.   He was noted to have bilateral edematous legs and he was speaking in short sentences and was noted to have a prolonged expiratory phase. His respiratory rate was elevated on arrival. He was given some albuterol treatments started on routine respiratory care.  11/18: Patient was admitted with hypoxemic respiratory failure secondary to COPD exacerbation.  He was also suspected to have some mild acute diastolic CHF exacerbation and was started on IV Lasix during this admission.  He overall feels much better and is much less short of breath and has no further wheezing and cough or lower extremity edema.  It is questionable how accurate his intake and output measurements have been, however since he is doing much better at this point in time even with physical therapy work he appears to be stable for discharge.  He will require some nasal cannula oxygen which will be arranged for him on discharge.   He can continue his home oral Lasix as well as nebulized treatments at home as needed.  He will be discharged on some oral prednisone as well as doxycycline to complete course of treatment.  Continue home diabetes medications as prior.  Discharge Diagnoses:  Principal Problem:   Acute respiratory failure with hypoxia ([name]) Active Problems:   DM type 2 (diabetes mellitus, type 2) ([name])   HTN (hypertension)   Hypoxia   Chronic diastolic CHF (congestive heart failure) ([name])   PVD (peripheral vascular disease) ([name])   Generalized weakness   GERD (gastroesophageal reflux disease)   COPD with acute exacerbation ([place])  Principal discharge diagnosis: Acute hypoxemic respiratory failure-multifactorial with COPD exacerbation and mild acute on chronic diastolic CHF exacerbation.  Discharge Instructions  Discharge Instructions    Diet - low sodium heart healthy   Complete by: As directed    Increase activity slowly   Complete by: As directed      Allergies as of 04/22/2019      Reactions   Blueberry Flavor Other (See Comments)   Unknown   Cucumber Extract Other (See Comments)   "Fells like I'm having a heart attack"   Flexeril [cyclobenzaprine Hcl] Other (See Comments)   "whole body  Tremors" (05/27/2012)   Kiwi Extract Other (See Comments)   "feels like I'm having a heart attack"      Medication List    TAKE these medications   acetaminophen 325 MG tablet Commonly known as: TYLENOL Take 650 mg by mouth every 6 (six) hours as needed.   albuterol (2.5 MG/3ML) 0.083% nebulizer solution Commonly known as: PROVENTIL Take 2.5 mg by nebulization every 3 (three) hours as needed  for wheezing or shortness of breath.   ALPRAZolam 1 MG tablet Commonly known as: XANAX Take 1 tablet (1 mg total) by mouth 3 (three) times daily. What changed:   when to take this  additional instructions   amLODipine 10 MG tablet Commonly known as: NORVASC Take 10 mg by mouth daily.   aspirin EC 81  MG tablet Take 81 mg by mouth daily.   Breo Ellipta 100-25 MCG/INH Aepb Generic drug: fluticasone furoate-vilanterol Inhale 2 puffs into the lungs daily.   calcium carbonate 500 MG chewable tablet Commonly known as: TUMS - dosed in mg elemental calcium Chew 2 tablets by mouth 2 (two) times daily.   cholecalciferol 1000 units tablet Commonly known as: VITAMIN D Take 3,000 Units by mouth daily.   cyanocobalamin 1000 MCG tablet Take 1,000 mcg by mouth daily.   doxycycline 100 MG tablet Commonly known as: VIBRA-TABS Take 1 tablet (100 mg total) by mouth every 12 (twelve) hours for 5 days.   DULoxetine 60 MG capsule Commonly known as: CYMBALTA Take 60 mg by mouth daily.   fish oil-omega-3 fatty acids 1000 MG capsule Take 1 g by mouth 2 (two) times daily.   furosemide 40 MG tablet Commonly known as: LASIX Take 40 mg by mouth daily.   furosemide 20 MG tablet Commonly known as: LASIX Take 1 tablet by mouth daily.   gabapentin 400 MG capsule Commonly known as: NEURONTIN Take 400 mg by mouth every 8 (eight) hours.   HumaLOG KwikPen 100 UNIT/ML KwikPen Generic drug: insulin lispro Inject 2-14 Units into the skin 3 (three) times daily. Per sliding scale 150-200= 2 units 201-250= 4 units 251-300= 6 units 301-349= 8 units 350-400=10units 401-450=12units 451-500=14units ABOVE 501- CALL MD   HYDROcodone-acetaminophen 5-325 MG tablet Commonly known as: NORCO/VICODIN Take 1 tablet by mouth daily.   insulin glargine 100 UNIT/ML injection Commonly known as: LANTUS Inject 45 Units into the skin at bedtime.   lidocaine 5 % Commonly known as: LIDODERM Place 1 patch onto the skin 2 (two) times daily. Remove & Discard patch within 12 hours or as directed by MD. Apply to lower back twice daily.   magnesium oxide 400 MG tablet Commonly known as: MAG-OX Take 400 mg by mouth daily.   metoprolol tartrate 25 MG tablet Commonly known as: LOPRESSOR Take 1 tablet (25 mg total) by  mouth 2 (two) times daily. What changed: how much to take   multivitamin tablet Take 1 tablet by mouth daily.   pantoprazole 40 MG tablet Commonly known as: PROTONIX Take 1 tablet (40 mg total) by mouth daily before breakfast.   predniSONE 20 MG tablet Commonly known as: Deltasone Take 2 tablets (40 mg total) by mouth daily for 5 days.            Durable Medical Equipment  (From admission, onward)         Start     Ordered   04/22/19 1053  DME Oxygen  Once    Question Answer Comment  Length of Need 6 Months   Mode or (Route) Nasal cannula   Liters per Minute 2   Oxygen delivery system Gas      04/22/19 1052         Follow-up Information    [name], MD Follow up in 1 week(s).   Specialty: Internal Medicine Contact information: [place] [address] [phone]          Allergies  Allergen Reactions  . Blueberry Flavor Other (  See Comments)    Unknown  . Cucumber Extract Other (See Comments)    "Fells like I'm having a heart attack"  . Flexeril [Cyclobenzaprine Hcl] Other (See Comments)    "whole body  Tremors" (05/27/2012)  . Kiwi Extract Other (See Comments)    "feels like I'm having a heart attack"    Consultations:  None   Procedures/Studies: Nm Pulmonary Perf And Vent  Result Date: 04/20/2019 CLINICAL DATA:  PE suspected, intermediate probability, positive D-dimer. EXAM: NUCLEAR MEDICINE VENTILATION - PERFUSION LUNG SCAN TECHNIQUE: Ventilation images were obtained in multiple projections using inhaled aerosol Tc-43m DTPA. Perfusion images were obtained in multiple projections after intravenous injection of Tc-65m MAA. RADIOPHARMACEUTICALS:  1.5 mCi Tc23m MAA IV COMPARISON:  One-view chest x-ray 04/20/2019 FINDINGS: Ventilation: Mild performed Perfusion: No wedge shaped peripheral perfusion defects to suggest acute pulmonary embolism. IMPRESSION: Negative perfusion scan.  No evidence for pulmonary embolus. Electronically Signed    By: San Morelle M.D.   On: 04/20/2019 14:44   US Venous Img Lower Bilateral  Result Date: 04/20/2019 CLINICAL DATA:  68 year old male with bilateral lower extremity edema EXAM: BILATERAL LOWER EXTREMITY VENOUS DOPPLER ULTRASOUND TECHNIQUE: Gray-scale sonography with graded compression, as well as color Doppler and duplex ultrasound were performed to evaluate the lower extremity deep venous systems from the level of the common femoral vein and including the common femoral, femoral, profunda femoral, popliteal and calf veins including the posterior tibial, peroneal and gastrocnemius veins when visible. The superficial great saphenous vein was also interrogated. Spectral Doppler was utilized to evaluate flow at rest and with distal augmentation maneuvers in the common femoral, femoral and popliteal veins. COMPARISON:  None. FINDINGS: RIGHT LOWER EXTREMITY Common Femoral Vein: No evidence of thrombus. Normal compressibility, respiratory phasicity and response to augmentation. Saphenofemoral Junction: No evidence of thrombus. Normal compressibility and flow on color Doppler imaging. Profunda Femoral Vein: No evidence of thrombus. Normal compressibility and flow on color Doppler imaging. Femoral Vein: No evidence of thrombus. Normal compressibility, respiratory phasicity and response to augmentation. Popliteal Vein: No evidence of thrombus. Normal compressibility, respiratory phasicity and response to augmentation. Calf Veins: No evidence of thrombus. Normal compressibility and flow on color Doppler imaging. Superficial Great Saphenous Vein: No evidence of thrombus. Normal compressibility. Venous Reflux:  None. Other Findings: Superficial subcutaneous edema noted throughout the calves. LEFT LOWER EXTREMITY Common Femoral Vein: No evidence of thrombus. Normal compressibility, respiratory phasicity and response to augmentation. Saphenofemoral Junction: No evidence of thrombus. Normal compressibility and flow  on color Doppler imaging. Profunda Femoral Vein: No evidence of thrombus. Normal compressibility and flow on color Doppler imaging. Femoral Vein: No evidence of thrombus. Normal compressibility, respiratory phasicity and response to augmentation. Popliteal Vein: No evidence of thrombus. Normal compressibility, respiratory phasicity and response to augmentation. Calf Veins: No evidence of thrombus. Normal compressibility and flow on color Doppler imaging. Superficial Great Saphenous Vein: No evidence of thrombus. Normal compressibility. Venous Reflux:  None. Other Findings: Superficial subcutaneous edema noted throughout the calves. IMPRESSION: No evidence of deep venous thrombosis. Electronically Signed   By: Jacqulynn Cadet M.D.   On: 04/20/2019 16:08   Portable Chest 1 View  Result Date: 04/21/2019 CLINICAL DATA:  Respiratory failure. EXAM: PORTABLE CHEST 1 VIEW COMPARISON:  Radiograph yesterday. CT 07/20/2014 FINDINGS: Cardiomegaly. Unchanged mediastinal contours. Small left pleural effusion. 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 Improved bronchitic and interstitial markings. No confluent airspace disease or pneumothorax. IMPRESSION: 1. Stable  cardiomegaly.  Small left pleural effusion. 2. Improving bronchitic and interstitial markings may represent improving pulmonary edema or bronchial inflammation. 3. 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 Electronically Signed   By: Keith Rake M.D.   On: 04/21/2019 05:16   Dg Chest Port 1 View  Result Date: 04/20/2019 CLINICAL DATA:  Cough, shortness of breath. Additional history provided: Diabetic, hypertension, CHF, COPD, smoker. EXAM: PORTABLE CHEST 1 VIEW COMPARISON:  Chest radiograph 11/23/2016 FINDINGS: Please note the left lateral costophrenic angle is partially excluded from the field of view. Stable cardiomegaly.  Aortic atherosclerosis.  Shallow inspiration radiograph. Prominence of the interstitial markings, greatest at the right lung base. No evidence of pneumothorax or sizable pleural effusion. No acute bony abnormality IMPRESSION: Cardiomegaly. Prominence of the interstitial markings, greatest at the right lung base. Findings may reflect edema. Infection not excluded. Electronically Signed   By: Kellie Simmering DO   On: 04/20/2019 08:43     Discharge Exam: Vitals:   04/22/19 0805 04/22/19 0809  BP:  (!) 164/82  Pulse:  71  Resp:    Temp:    SpO2: 94%    Vitals:   04/21/19 2200 04/22/19 0634 04/22/19 0805 04/22/19 0809  BP: (!) 165/78 (!) 141/70  (!) 164/82  Pulse: 79 64  71  Resp: (!) 24 20    Temp: 98.2 F (36.8 C) 97.7 F (36.5 C)    TempSrc: Oral Oral    SpO2: 91% 97% 94%   Weight:      Height:        General: Pt is alert, awake, not in acute distress Cardiovascular: RRR, S1/S2 +, no rubs, no gallops Respiratory: CTA bilaterally, no wheezing, no rhonchi, 2 L nasal cannula Abdominal: Soft, NT, ND, bowel sounds + Extremities: no edema, no cyanosis    The results of significant diagnostics from this hospitalization (including imaging, microbiology, ancillary and laboratory) are listed below for reference.     Microbiology: Recent Results (from the past 240 hour(s))  Urine culture     Status: Abnormal   Collection Time: 04/20/19  8:13 AM   Specimen: In/Out Cath Urine  Result Value Ref Range Status   Specimen Description   Final    IN/OUT CATH URINE Performed at [facility], [address] [place], [place] [address]    Special Requests   Final    NONE Performed at [facility], [address], [place], [place] [address]    Culture MULTIPLE SPECIES PRESENT, SUGGEST RECOLLECTION (A)  Final   Report Status 04/21/2019 FINAL  Final  Blood Culture (routine x 2)     Status: None (Preliminary result)   Collection Time: 04/20/19  8:37 AM   Specimen: BLOOD RIGHT HAND  Result Value Ref Range Status    Specimen Description   Final    BLOOD RIGHT HAND BOTTLES DRAWN AEROBIC AND ANAEROBIC   Special Requests   Final    Blood Culture results may not be optimal due to an excessive volume of blood received in culture bottles   Culture   Final    NO GROWTH 2 DAYS Performed at [facility], [address] [place], [place] [address]    Report Status PENDING  Incomplete  Blood Culture (routine x 2)     Status: None (Preliminary result)   Collection Time: 04/20/19  8:58 AM   Specimen: Right Antecubital; Blood  Result Value Ref Range Status   Specimen Description  Final    RIGHT ANTECUBITAL BOTTLES DRAWN AEROBIC AND ANAEROBIC   Special Requests   Final    Blood Culture results may not be optimal due to an excessive volume of blood received in culture bottles   Culture   Final    NO GROWTH 2 DAYS Performed at [facility], [address] [place], [place] [address]    Report Status PENDING  Incomplete  SARS CORONAVIRUS 2 (TAT 6-24 HRS) Nasopharyngeal Nasopharyngeal Swab     Status: None   Collection Time: 04/20/19  9:26 AM   Specimen: Nasopharyngeal Swab  Result Value Ref Range Status   SARS Coronavirus 2 NEGATIVE NEGATIVE Final    Comment: (NOTE) SARS-CoV-2 target nucleic acids are NOT DETECTED. The SARS-CoV-2 RNA is generally detectable in upper and lower respiratory specimens during the acute phase of infection. Negative results do not preclude SARS-CoV-2 infection, do not rule out co-infections with other pathogens, and should not be used as the sole basis for treatment or other patient management decisions. Negative results must be combined with clinical observations, patient history, and epidemiological information. The expected result is Negative. Fact Sheet for Patients: SugarRoll.be Fact Sheet for Healthcare Providers: https://www.woods-mathews.com/ This test is not yet approved or cleared by the [place] FDA and  has been authorized  for detection and/or diagnosis of SARS-CoV-2 by FDA under an Emergency Use Authorization (EUA). This EUA will remain  in effect (meaning this test can be used) for the duration of the COVID-19 declaration under Section 56 4(b)(1) of the Act, 21 U.S.C. section 360bbb-3(b)(1), unless the authorization is terminated or revoked sooner. Performed at [facility], [place] [address], [place], [place] [address]   MRSA PCR Screening     Status: None   Collection Time: 04/21/19 11:24 AM   Specimen: Nasal Mucosa; Nasopharyngeal  Result Value Ref Range Status   MRSA by PCR NEGATIVE NEGATIVE Final    Comment:        The GeneXpert MRSA Assay (FDA approved for NASAL specimens only), is one component of a comprehensive MRSA colonization surveillance program. It is not intended to diagnose MRSA infection nor to guide or monitor treatment for MRSA infections. Performed at [facility], [address] [place], [place] [address]      Labs: BNP (last 3 results) Recent Labs    04/20/19 0853  BNP 329.9*   Basic Metabolic Panel: Recent Labs  Lab 04/20/19 0853 04/21/19 0522 04/21/19 1228 04/22/19 0513  NA 142 140 140 143  K 4.4 5.8* 5.1 4.7  CL 106 105 105 106  CO2 24 25 23 25   GLUCOSE 152* 291* 181* 226*  BUN 32* 44* 48* 60*  CREATININE 2.84* 3.02* 3.08* 3.10*  CALCIUM 8.4* 8.8* 8.7* 8.7*  MG  --  2.5*  --  2.5*  PHOS  --   --   --  4.5   Liver Function Tests: Recent Labs  Lab 04/20/19 0853 04/21/19 0522 04/22/19 0513  AST 10* 10*  --   ALT 8 8  --   ALKPHOS 77 71  --   BILITOT 0.8 0.6  --   PROT 7.5 7.4  --   ALBUMIN 3.3* 3.4* 3.5   No results for input(s): LIPASE, AMYLASE in the last 168 hours. No results for input(s): AMMONIA in the last 168 hours. CBC: Recent Labs  Lab 04/20/19 0853 04/21/19 0522 04/22/19 0513  WBC 11.7* 8.6 11.3*  NEUTROABS 9.0* 7.7 10.1*  HGB 8.3* 8.0* 8.2*  HCT 27.4*  26.6* 27.5*  MCV 95.8 95.7 97.5  PLT 301 273 288   Cardiac  Enzymes: No results for input(s): CKTOTAL, CKMB, CKMBINDEX, TROPONINI in the last 168 hours. BNP: Invalid input(s): POCBNP CBG: Recent Labs  Lab 04/21/19 0755 04/21/19 1121 04/21/19 1637 04/21/19 2157 04/22/19 0757  GLUCAP 317* 186* 151* 166* 196*   D-Dimer Recent Labs    04/20/19 0853  DDIMER 2.80*   Hgb A1c Recent Labs    04/20/19 0854  HGBA1C 5.8*   Lipid Profile Recent Labs    04/20/19 0853  TRIG 127   Thyroid function studies No results for input(s): TSH, T4TOTAL, T3FREE, THYROIDAB in the last 72 hours.  Invalid input(s): FREET3 Anemia work up Recent Labs    04/20/19 0853  FERRITIN 158   Urinalysis    Component Value Date/Time   COLORURINE YELLOW 12/10/2016 1041   APPEARANCEUR HAZY (A) 12/10/2016 1041   LABSPEC 1.016 12/10/2016 1041   PHURINE 5.0 12/10/2016 1041   GLUCOSEU NEGATIVE 12/10/2016 1041   HGBUR LARGE (A) 12/10/2016 1041   BILIRUBINUR NEGATIVE 12/10/2016 1041   KETONESUR 5 (A) 12/10/2016 1041   PROTEINUR 100 (A) 12/10/2016 1041   UROBILINOGEN 0.2 03/04/2015 1835   NITRITE NEGATIVE 12/10/2016 1041   LEUKOCYTESUR TRACE (A) 12/10/2016 1041   Sepsis Labs Invalid input(s): PROCALCITONIN,  WBC,  LACTICIDVEN Microbiology Recent Results (from the past 240 hour(s))  Urine culture     Status: Abnormal   Collection Time: 04/20/19  8:13 AM   Specimen: In/Out Cath Urine  Result Value Ref Range Status   Specimen Description   Final    IN/OUT CATH URINE Performed at [facility], [address], [place], [place] [address]    Special Requests   Final    NONE Performed at [facility], [address] [place], [place] [address]    Culture MULTIPLE SPECIES PRESENT, SUGGEST RECOLLECTION (A)  Final   Report Status 04/21/2019 FINAL  Final  Blood Culture (routine x 2)     Status: None (Preliminary result)   Collection Time: 04/20/19  8:37 AM   Specimen: BLOOD RIGHT HAND  Result Value Ref Range Status   Specimen Description   Final    BLOOD RIGHT  HAND BOTTLES DRAWN AEROBIC AND ANAEROBIC   Special Requests   Final    Blood Culture results may not be optimal due to an excessive volume of blood received in culture bottles   Culture   Final    NO GROWTH 2 DAYS Performed at [facility], [address], [place], [place] [address]    Report Status PENDING  Incomplete  Blood Culture (routine x 2)     Status: None (Preliminary result)   Collection Time: 04/20/19  8:58 AM   Specimen: Right Antecubital; Blood  Result Value Ref Range Status   Specimen Description   Final    RIGHT ANTECUBITAL BOTTLES DRAWN AEROBIC AND ANAEROBIC   Special Requests   Final    Blood Culture results may not be optimal due to an excessive volume of blood received in culture bottles   Culture   Final    NO GROWTH 2 DAYS Performed at [facility], [address] [place], [place] [address]    Report Status PENDING  Incomplete  SARS CORONAVIRUS 2 (TAT 6-24 HRS) Nasopharyngeal Nasopharyngeal Swab     Status: None   Collection Time: 04/20/19  9:26 AM   Specimen: Nasopharyngeal Swab  Result Value Ref Range Status   SARS Coronavirus 2 NEGATIVE NEGATIVE Final  Comment: (NOTE) SARS-CoV-2 target nucleic acids are NOT DETECTED. The SARS-CoV-2 RNA is generally detectable in upper and lower respiratory specimens during the acute phase of infection. Negative results do not preclude SARS-CoV-2 infection, do not rule out co-infections with other pathogens, and should not be used as the sole basis for treatment or other patient management decisions. Negative results must be combined with clinical observations, patient history, and epidemiological information. The expected result is Negative. Fact Sheet for Patients: SugarRoll.be Fact Sheet for Healthcare Providers: https://www.woods-mathews.com/ This test is not yet approved or cleared by the [place] FDA and  has been authorized for detection and/or diagnosis of SARS-CoV-2  by FDA under an Emergency Use Authorization (EUA). This EUA will remain  in effect (meaning this test can be used) for the duration of the COVID-19 declaration under Section 56 4(b)(1) of the Act, 21 U.S.C. section 360bbb-3(b)(1), unless the authorization is terminated or revoked sooner. Performed at [facility], [place] [address] [place], [place] [address]   MRSA PCR Screening     Status: None   Collection Time: 04/21/19 11:24 AM   Specimen: Nasal Mucosa; Nasopharyngeal  Result Value Ref Range Status   MRSA by PCR NEGATIVE NEGATIVE Final    Comment:        The GeneXpert MRSA Assay (FDA approved for NASAL specimens only), is one component of a comprehensive MRSA colonization surveillance program. It is not intended to diagnose MRSA infection nor to guide or monitor treatment for MRSA infections. Performed at [facility], [address] [place], [place] [address]      Time coordinating discharge: 35 minutes  SIGNED:   Rodena Goldmann, DO [facility] 04/22/2019, 10:53 AM  If 7PM-7AM, please contact night-coverage [email or web address]

## 2019-04-22 NOTE — NC FL2 (Signed)
[place] MEDICAID FL2 LEVEL OF CARE SCREENING TOOL     IDENTIFICATION  Patient Name: Eric Scott Birthdate: 1950/07/10 Sex: male Admission Date (Current Location): 04/20/2019  [place] and [place] Number:  [name], [name] Son [phone]  [phone] Facility and Address:  [facility] [address] [place]      Provider Number: [id number]  Attending Physician Name and Address:  Rodena Goldmann, DO  Relative Name and Phone Number:       Current Level of Care: Hospital Recommended Level of Care: [place] Prior Approval Number:    Date Approved/Denied:   PASRR Number:    Discharge Plan: SNF    Current Diagnoses: Patient Active Problem List   Diagnosis Date Noted  . COPD with acute exacerbation ([place]) 04/20/2019  . GERD (gastroesophageal reflux disease) 08/08/2018  . PUD (peptic ulcer disease)   . Gastric erosions 02/13/2017  . Duodenal ulcer 02/13/2017  . Transaminitis   . UGI bleed   . Arm DVT (deep venous thromboembolism), acute, right ([place]) 12/18/2016  . Uremia 12/10/2016  . Acute renal failure ([place])   . Volume depletion 11/23/2016  . Hypotension   . Liver enzyme elevation   . 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 Arterial insufficiency of lower extremity ([place]) 11/15/2016  . Pressure injury of skin 11/09/2016  . Sepsis ([place]) 11/09/2016  . Osteomyelitis ([place]) 11/09/2016  . Wounds, multiple 11/08/2016  . Chronic venous insufficiency 10/12/2016  . Critical lower limb ischemia 10/12/2016  . Fatty liver 09/10/2016  . History of colonic polyps 09/10/2016  . Gallstone 09/10/2016  . Edema 03/04/2015  . AKI (acute kidney injury) ([place]) 03/04/2015  . Fall 03/04/2015  . Generalized weakness 03/04/2015  . Diabetic polyneuropathy associated with type 2 diabetes mellitus ([place])   . Anxiety   . PVD (peripheral vascular disease) ([place])   .  Pneumonia 07/20/2014  . Weight gain 05/02/2014  . Coarse tremors 04/18/2014  . Anxiety state 04/18/2014  . Acute respiratory failure with hypoxia ([place]) 04/07/2014  . Chronic diastolic CHF (congestive heart failure) ([place]) 04/07/2014  . Solitary pulmonary nodule 04/07/2014  . COPD with exacerbation ([place]) 04/02/2014  . Hypoxia 04/02/2014  . CVA (cerebral infarction) 06/01/2012  . Chronic pain (back, legs) 05/30/2012  . Toe laceration, 4th toe 05/30/2012  . C. difficile diarrhea 05/30/2012  . Hypokalemia 05/30/2012  . Acute lacunar stroke ([place]) 05/27/2012  . DM type 2 (diabetes mellitus, type 2) ([place]) 05/27/2012  . Smoker 05/27/2012  . HTN (hypertension) 05/27/2012    Orientation RESPIRATION BLADDER Height & Weight     Self, Time, Situation  O2 Continent Weight: (!) 139.7 kg Height:  6' (182.9 cm)  BEHAVIORAL SYMPTOMS/MOOD NEUROLOGICAL BOWEL NUTRITION STATUS      Continent Diet(see DC summary)  AMBULATORY STATUS COMMUNICATION OF NEEDS Skin   Extensive Assist Verbally Normal                       Personal Care Assistance Level of Assistance  Bathing, Feeding, Dressing Bathing Assistance: Independent Feeding assistance: Independent Dressing Assistance: Limited assistance     Functional Limitations Info  Sight, Hearing, Speech Sight Info: Adequate Hearing Info: Adequate Speech Info: Adequate    SPECIAL CARE FACTORS FREQUENCY  PT (By licensed PT)     PT Frequency: 5x/week              Contractures Contractures Info: Not present  Additional Factors Info  Code Status, Allergies Code Status Info: Full code Allergies Info: blueberry flavor, cucumber extract, flexeril, kiwi extract           Current Medications (04/22/2019):  This is the current hospital active medication list Current Facility-Administered Medications  Medication Dose Route Frequency Provider Last Rate Last Dose  . 0.9 %  sodium chloride infusion  250 mL Intravenous PRN Irwin Brakeman  L, MD   Stopped at 04/20/19 2129  . acetaminophen (TYLENOL) tablet 650 mg  650 mg Oral Q6H PRN Johnson, Clanford L, MD       Or  . acetaminophen (TYLENOL) suppository 650 mg  650 mg Rectal Q6H PRN Johnson, Clanford L, MD      . ALPRAZolam Duanne Moron) tablet 0.5 mg  0.5 mg Oral BID PRN Wynetta Emery, Clanford L, MD   0.5 mg at 04/21/19 2232  . amLODipine (NORVASC) tablet 10 mg  10 mg Oral Daily Johnson, Clanford L, MD   10 mg at 04/22/19 0807  . aspirin EC tablet 81 mg  81 mg Oral Daily Johnson, Clanford L, MD   81 mg at 04/22/19 0809  . calcium carbonate (TUMS - dosed in mg elemental calcium) chewable tablet 400 mg of elemental calcium  2 tablet Oral BID Johnson, Clanford L, MD   400 mg of elemental calcium at 04/22/19 0808  . cholecalciferol (VITAMIN D3) tablet 3,000 Units  3,000 Units Oral Daily Irwin Brakeman L, MD   3,000 Units at 04/22/19 0807  . doxycycline (VIBRA-TABS) tablet 100 mg  100 mg Oral Q12H Johnson, Clanford L, MD   100 mg at 04/22/19 0809  . DULoxetine (CYMBALTA) DR capsule 60 mg  60 mg Oral Daily Johnson, Clanford L, MD   60 mg at 04/22/19 0808  . enoxaparin (LOVENOX) injection 40 mg  40 mg Subcutaneous Q24H Johnson, Clanford L, MD   40 mg at 04/21/19 1535  . fluticasone furoate-vilanterol (BREO ELLIPTA) 100-25 MCG/INH 2 puff  2 puff Inhalation Daily Johnson, Clanford L, MD   2 puff at 04/22/19 0805  . furosemide (LASIX) injection 40 mg  40 mg Intravenous Daily Johnson, Clanford L, MD   40 mg at 04/22/19 0804  . gabapentin (NEURONTIN) capsule 400 mg  400 mg Oral Q8H Johnson, Clanford L, MD   400 mg at 04/22/19 0520  . HYDROcodone-acetaminophen (NORCO/VICODIN) 5-325 MG per tablet 1 tablet  1 tablet Oral Q6H PRN Irwin Brakeman L, MD   1 tablet at 04/20/19 2231  . insulin aspart (novoLOG) injection 0-15 Units  0-15 Units Subcutaneous TID WC Johnson, Clanford L, MD   8 Units at 04/22/19 1126  . insulin aspart (novoLOG) injection 0-5 Units  0-5 Units Subcutaneous QHS Irwin Brakeman L,  MD   4 Units at 04/20/19 2228  . insulin aspart (novoLOG) injection 12 Units  12 Units Subcutaneous TID WC Johnson, Clanford L, MD   12 Units at 04/22/19 1126  . insulin glargine (LANTUS) injection 45 Units  45 Units Subcutaneous QHS Murlean Iba, MD   45 Units at 04/21/19 2233  . lidocaine (LIDODERM) 5 % 1 patch  1 patch Transdermal BID Wynetta Emery, Clanford L, MD   1 patch at 04/22/19 0817  . magnesium oxide (MAG-OX) tablet 400 mg  400 mg Oral Daily Johnson, Clanford L, MD   400 mg at 04/22/19 0809  . methylPREDNISolone sodium succinate (SOLU-MEDROL) 40 mg/mL injection 40 mg  40 mg Intravenous Q12H Johnson, Clanford L, MD   40 mg at 04/22/19 0804  .  metoprolol tartrate (LOPRESSOR) tablet 50 mg  50 mg Oral BID Wynetta Emery, Clanford L, MD   50 mg at 04/22/19 0809  . multivitamin with minerals tablet 1 tablet  1 tablet Oral Daily Wynetta Emery, Clanford L, MD   1 tablet at 04/21/19 1110  . nicotine (NICODERM CQ - dosed in mg/24 hours) patch 14 mg  14 mg Transdermal Daily Johnson, Clanford L, MD   14 mg at 04/22/19 0817  . omega-3 acid ethyl esters (LOVAZA) capsule 1 g  1 g Oral BID Wynetta Emery, Clanford L, MD   1 g at 04/22/19 0808  . ondansetron (ZOFRAN) tablet 4 mg  4 mg Oral Q6H PRN Johnson, Clanford L, MD       Or  . ondansetron (ZOFRAN) injection 4 mg  4 mg Intravenous Q6H PRN Johnson, Clanford L, MD      . pantoprazole (PROTONIX) EC tablet 40 mg  40 mg Oral QAC breakfast Johnson, Clanford L, MD   40 mg at 04/22/19 0809  . sodium chloride flush (NS) 0.9 % injection 3 mL  3 mL Intravenous Q12H Johnson, Clanford L, MD   3 mL at 04/22/19 0900  . sodium chloride flush (NS) 0.9 % injection 3 mL  3 mL Intravenous PRN Johnson, Clanford L, MD      . vitamin B-12 (CYANOCOBALAMIN) tablet 1,000 mcg  1,000 mcg Oral Daily Wynetta Emery, Clanford L, MD   1,000 mcg at 04/22/19 0808     Discharge Medications: Please see discharge summary for a list of discharge medications.  Relevant Imaging Results:  Relevant Lab  Results:   Additional Information SSN [id number]  Wyvonne Carda, Chauncey Reading, RN

## 2019-04-22 NOTE — Progress Notes (Signed)
Phone call out to [facility] and spoke with nurse Hilda Blades to give report. Transportation assigned and reported medical needs to. Spoke with patient concerning new medical O2 use and transfer back to [facility]. He stated his understanding.

## 2019-04-22 NOTE — TOC Transition Note (Signed)
Transition of Care [facility]) - CM/SW Discharge Note   Patient Details  Name: Eric Scott MRN: [id number] Date of Birth: [birth year]  [facility] CM/SW Contact:  Eliga Arvie, Chauncey Reading, RN Phone Number: 04/22/2019, 12:53 PM   Clinical Narrative:   PT notes sent. Bedside RN to call report and EMS when ready.     Final next level of care: Skilled Nursing Facility Barriers to Discharge: Barriers Resolved  Discharge Placement              Patient chooses bed at: Other - please specify in the comment section below:([facility]) Patient to be transferred to facility by: [place] Name of family member notified: patient, tried to call son, patient will update son Patient and family notified of of transfer: 04/22/19   Social Determinants of Health (SDOH) Interventions     Readmission Risk Interventions Readmission Risk Prevention Plan 04/22/2019  Transportation Screening Complete  Medication Review Press photographer) Complete  PCP or Specialist appointment within 3-5 days of discharge Not Complete  PCP/Specialist Appt Not Complete comments SNF MD  [place] or [place] Not Complete  HRI or Home Care Consult Pt Refusal Comments from SNF  SW Recovery Care/Counseling Consult Complete  [place] Complete  Some recent data might be hidden

## 2019-04-22 NOTE — Care Management Important Message (Signed)
Important Message  Patient Details  Name: Eric Scott MRN: [id number] Date of Birth: 26-Jul-1950   Medicare Important Message Given:  Yes     Tommy Medal 04/22/2019, 2:06 PM

## 2019-04-23 DIAGNOSIS — Z716 Tobacco abuse counseling: Secondary | ICD-10-CM | POA: Diagnosis not present

## 2019-04-23 DIAGNOSIS — I5033 Acute on chronic diastolic (congestive) heart failure: Secondary | ICD-10-CM | POA: Diagnosis not present

## 2019-04-23 DIAGNOSIS — R69 Illness, unspecified: Secondary | ICD-10-CM | POA: Diagnosis not present

## 2019-04-23 DIAGNOSIS — R2681 Unsteadiness on feet: Secondary | ICD-10-CM | POA: Diagnosis not present

## 2019-04-23 DIAGNOSIS — M6281 Muscle weakness (generalized): Secondary | ICD-10-CM | POA: Diagnosis not present

## 2019-04-23 DIAGNOSIS — J9601 Acute respiratory failure with hypoxia: Secondary | ICD-10-CM | POA: Diagnosis not present

## 2019-04-23 DIAGNOSIS — J441 Chronic obstructive pulmonary disease with (acute) exacerbation: Secondary | ICD-10-CM | POA: Diagnosis not present

## 2019-04-24 ENCOUNTER — Ambulatory Visit (HOSPITAL_COMMUNITY): Payer: Medicare HMO

## 2019-04-24 DIAGNOSIS — M6281 Muscle weakness (generalized): Secondary | ICD-10-CM | POA: Diagnosis not present

## 2019-04-24 DIAGNOSIS — J9601 Acute respiratory failure with hypoxia: Secondary | ICD-10-CM | POA: Diagnosis not present

## 2019-04-24 DIAGNOSIS — R2681 Unsteadiness on feet: Secondary | ICD-10-CM | POA: Diagnosis not present

## 2019-04-25 LAB — CULTURE, BLOOD (ROUTINE X 2)
Culture: NO GROWTH
Culture: NO GROWTH

## 2019-04-27 DIAGNOSIS — J9601 Acute respiratory failure with hypoxia: Secondary | ICD-10-CM | POA: Diagnosis not present

## 2019-04-27 DIAGNOSIS — R2681 Unsteadiness on feet: Secondary | ICD-10-CM | POA: Diagnosis not present

## 2019-04-27 DIAGNOSIS — M6281 Muscle weakness (generalized): Secondary | ICD-10-CM | POA: Diagnosis not present

## 2019-04-28 DIAGNOSIS — R7989 Other specified abnormal findings of blood chemistry: Secondary | ICD-10-CM | POA: Diagnosis not present

## 2019-04-28 DIAGNOSIS — Z79899 Other long term (current) drug therapy: Secondary | ICD-10-CM | POA: Diagnosis not present

## 2019-04-28 DIAGNOSIS — E119 Type 2 diabetes mellitus without complications: Secondary | ICD-10-CM | POA: Diagnosis not present

## 2019-04-28 DIAGNOSIS — I10 Essential (primary) hypertension: Secondary | ICD-10-CM | POA: Diagnosis not present

## 2019-04-28 DIAGNOSIS — D649 Anemia, unspecified: Secondary | ICD-10-CM | POA: Diagnosis not present

## 2019-04-28 DIAGNOSIS — E559 Vitamin D deficiency, unspecified: Secondary | ICD-10-CM | POA: Diagnosis not present

## 2019-04-28 DIAGNOSIS — I5032 Chronic diastolic (congestive) heart failure: Secondary | ICD-10-CM | POA: Diagnosis not present

## 2019-04-28 DIAGNOSIS — J441 Chronic obstructive pulmonary disease with (acute) exacerbation: Secondary | ICD-10-CM | POA: Diagnosis not present

## 2019-04-28 DIAGNOSIS — D519 Vitamin B12 deficiency anemia, unspecified: Secondary | ICD-10-CM | POA: Diagnosis not present

## 2019-04-29 DIAGNOSIS — R2681 Unsteadiness on feet: Secondary | ICD-10-CM | POA: Diagnosis not present

## 2019-04-29 DIAGNOSIS — M6281 Muscle weakness (generalized): Secondary | ICD-10-CM | POA: Diagnosis not present

## 2019-04-29 DIAGNOSIS — J9601 Acute respiratory failure with hypoxia: Secondary | ICD-10-CM | POA: Diagnosis not present

## 2019-04-30 DIAGNOSIS — Z20828 Contact with and (suspected) exposure to other viral communicable diseases: Secondary | ICD-10-CM | POA: Diagnosis not present

## 2019-05-01 DIAGNOSIS — R2681 Unsteadiness on feet: Secondary | ICD-10-CM | POA: Diagnosis not present

## 2019-05-01 DIAGNOSIS — M6281 Muscle weakness (generalized): Secondary | ICD-10-CM | POA: Diagnosis not present

## 2019-05-01 DIAGNOSIS — J9601 Acute respiratory failure with hypoxia: Secondary | ICD-10-CM | POA: Diagnosis not present

## 2019-05-04 DIAGNOSIS — N1832 Chronic kidney disease, stage 3b: Secondary | ICD-10-CM | POA: Diagnosis not present

## 2019-05-04 DIAGNOSIS — D509 Iron deficiency anemia, unspecified: Secondary | ICD-10-CM | POA: Diagnosis not present

## 2019-05-04 DIAGNOSIS — R2681 Unsteadiness on feet: Secondary | ICD-10-CM | POA: Diagnosis not present

## 2019-05-04 DIAGNOSIS — M6281 Muscle weakness (generalized): Secondary | ICD-10-CM | POA: Diagnosis not present

## 2019-05-04 DIAGNOSIS — I5033 Acute on chronic diastolic (congestive) heart failure: Secondary | ICD-10-CM | POA: Diagnosis not present

## 2019-05-04 DIAGNOSIS — E1122 Type 2 diabetes mellitus with diabetic chronic kidney disease: Secondary | ICD-10-CM | POA: Diagnosis not present

## 2019-05-04 DIAGNOSIS — J9601 Acute respiratory failure with hypoxia: Secondary | ICD-10-CM | POA: Diagnosis not present

## 2019-05-05 DIAGNOSIS — R2681 Unsteadiness on feet: Secondary | ICD-10-CM | POA: Diagnosis not present

## 2019-05-05 DIAGNOSIS — J9601 Acute respiratory failure with hypoxia: Secondary | ICD-10-CM | POA: Diagnosis not present

## 2019-05-05 DIAGNOSIS — M6281 Muscle weakness (generalized): Secondary | ICD-10-CM | POA: Diagnosis not present

## 2019-05-06 DIAGNOSIS — D649 Anemia, unspecified: Secondary | ICD-10-CM | POA: Diagnosis not present

## 2019-05-06 DIAGNOSIS — J9601 Acute respiratory failure with hypoxia: Secondary | ICD-10-CM | POA: Diagnosis not present

## 2019-05-06 DIAGNOSIS — R2681 Unsteadiness on feet: Secondary | ICD-10-CM | POA: Diagnosis not present

## 2019-05-06 DIAGNOSIS — M6281 Muscle weakness (generalized): Secondary | ICD-10-CM | POA: Diagnosis not present

## 2019-05-06 DIAGNOSIS — R7989 Other specified abnormal findings of blood chemistry: Secondary | ICD-10-CM | POA: Diagnosis not present

## 2019-05-07 DIAGNOSIS — M6281 Muscle weakness (generalized): Secondary | ICD-10-CM | POA: Diagnosis not present

## 2019-05-07 DIAGNOSIS — N1832 Chronic kidney disease, stage 3b: Secondary | ICD-10-CM | POA: Diagnosis not present

## 2019-05-07 DIAGNOSIS — R2681 Unsteadiness on feet: Secondary | ICD-10-CM | POA: Diagnosis not present

## 2019-05-07 DIAGNOSIS — J9601 Acute respiratory failure with hypoxia: Secondary | ICD-10-CM | POA: Diagnosis not present

## 2019-05-07 DIAGNOSIS — I5033 Acute on chronic diastolic (congestive) heart failure: Secondary | ICD-10-CM | POA: Diagnosis not present

## 2019-05-07 DIAGNOSIS — D72829 Elevated white blood cell count, unspecified: Secondary | ICD-10-CM | POA: Diagnosis not present

## 2019-05-08 ENCOUNTER — Ambulatory Visit (HOSPITAL_COMMUNITY)
Admission: RE | Admit: 2019-05-08 | Discharge: 2019-05-08 | Disposition: A | Discharge status: Home / Self Care | Payer: Medicare HMO | Source: Ambulatory Visit | Attending: Cardiovascular Disease | Admitting: Cardiovascular Disease

## 2019-05-08 ENCOUNTER — Other Ambulatory Visit: Payer: Self-pay

## 2019-05-08 DIAGNOSIS — M6281 Muscle weakness (generalized): Secondary | ICD-10-CM | POA: Diagnosis not present

## 2019-05-08 DIAGNOSIS — I5032 Chronic diastolic (congestive) heart failure: Secondary | ICD-10-CM | POA: Diagnosis not present

## 2019-05-08 DIAGNOSIS — R2681 Unsteadiness on feet: Secondary | ICD-10-CM | POA: Diagnosis not present

## 2019-05-08 DIAGNOSIS — J9601 Acute respiratory failure with hypoxia: Secondary | ICD-10-CM | POA: Diagnosis not present

## 2019-05-08 DIAGNOSIS — M19041 Primary osteoarthritis, right hand: Secondary | ICD-10-CM | POA: Diagnosis not present

## 2019-05-08 DIAGNOSIS — M19031 Primary osteoarthritis, right wrist: Secondary | ICD-10-CM | POA: Diagnosis not present

## 2019-05-08 NOTE — Progress Notes (Signed)
*  PRELIMINARY RESULTS* Echocardiogram 2D Echocardiogram has been performed.  Samuel Germany 05/08/2019, 12:29 PM

## 2019-05-11 DIAGNOSIS — I5033 Acute on chronic diastolic (congestive) heart failure: Secondary | ICD-10-CM | POA: Diagnosis not present

## 2019-05-11 DIAGNOSIS — R600 Localized edema: Secondary | ICD-10-CM | POA: Diagnosis not present

## 2019-05-11 DIAGNOSIS — I130 Hypertensive heart and chronic kidney disease with heart failure and stage 1 through stage 4 chronic kidney disease, or unspecified chronic kidney disease: Secondary | ICD-10-CM | POA: Diagnosis not present

## 2019-05-11 DIAGNOSIS — M6281 Muscle weakness (generalized): Secondary | ICD-10-CM | POA: Diagnosis not present

## 2019-05-11 DIAGNOSIS — J9601 Acute respiratory failure with hypoxia: Secondary | ICD-10-CM | POA: Diagnosis not present

## 2019-05-11 DIAGNOSIS — R2681 Unsteadiness on feet: Secondary | ICD-10-CM | POA: Diagnosis not present

## 2019-05-11 DIAGNOSIS — M109 Gout, unspecified: Secondary | ICD-10-CM | POA: Diagnosis not present

## 2019-05-12 DIAGNOSIS — R2681 Unsteadiness on feet: Secondary | ICD-10-CM | POA: Diagnosis not present

## 2019-05-12 DIAGNOSIS — N189 Chronic kidney disease, unspecified: Secondary | ICD-10-CM

## 2019-05-12 DIAGNOSIS — M6281 Muscle weakness (generalized): Secondary | ICD-10-CM | POA: Diagnosis not present

## 2019-05-12 DIAGNOSIS — J9601 Acute respiratory failure with hypoxia: Secondary | ICD-10-CM | POA: Diagnosis not present

## 2019-05-12 HISTORY — DX: Chronic kidney disease, unspecified: N18.9

## 2019-05-13 DIAGNOSIS — Z20828 Contact with and (suspected) exposure to other viral communicable diseases: Secondary | ICD-10-CM | POA: Diagnosis not present

## 2019-05-13 DIAGNOSIS — D649 Anemia, unspecified: Secondary | ICD-10-CM | POA: Diagnosis not present

## 2019-05-13 DIAGNOSIS — R7989 Other specified abnormal findings of blood chemistry: Secondary | ICD-10-CM | POA: Diagnosis not present

## 2019-05-13 DIAGNOSIS — R0602 Shortness of breath: Secondary | ICD-10-CM | POA: Diagnosis not present

## 2019-05-14 DIAGNOSIS — I5033 Acute on chronic diastolic (congestive) heart failure: Secondary | ICD-10-CM | POA: Diagnosis not present

## 2019-05-14 DIAGNOSIS — N1832 Chronic kidney disease, stage 3b: Secondary | ICD-10-CM | POA: Diagnosis not present

## 2019-05-14 DIAGNOSIS — I130 Hypertensive heart and chronic kidney disease with heart failure and stage 1 through stage 4 chronic kidney disease, or unspecified chronic kidney disease: Secondary | ICD-10-CM | POA: Diagnosis not present

## 2019-05-14 DIAGNOSIS — M6281 Muscle weakness (generalized): Secondary | ICD-10-CM | POA: Diagnosis not present

## 2019-05-14 DIAGNOSIS — R2681 Unsteadiness on feet: Secondary | ICD-10-CM | POA: Diagnosis not present

## 2019-05-14 DIAGNOSIS — J9601 Acute respiratory failure with hypoxia: Secondary | ICD-10-CM | POA: Diagnosis not present

## 2019-05-15 DIAGNOSIS — I5032 Chronic diastolic (congestive) heart failure: Secondary | ICD-10-CM | POA: Diagnosis not present

## 2019-05-15 DIAGNOSIS — D631 Anemia in chronic kidney disease: Secondary | ICD-10-CM | POA: Diagnosis not present

## 2019-05-15 DIAGNOSIS — N184 Chronic kidney disease, stage 4 (severe): Secondary | ICD-10-CM | POA: Diagnosis not present

## 2019-05-15 DIAGNOSIS — J9601 Acute respiratory failure with hypoxia: Secondary | ICD-10-CM | POA: Diagnosis not present

## 2019-05-15 DIAGNOSIS — N189 Chronic kidney disease, unspecified: Secondary | ICD-10-CM | POA: Diagnosis not present

## 2019-05-15 DIAGNOSIS — I129 Hypertensive chronic kidney disease with stage 1 through stage 4 chronic kidney disease, or unspecified chronic kidney disease: Secondary | ICD-10-CM | POA: Diagnosis not present

## 2019-05-15 DIAGNOSIS — M6281 Muscle weakness (generalized): Secondary | ICD-10-CM | POA: Diagnosis not present

## 2019-05-15 DIAGNOSIS — Z20828 Contact with and (suspected) exposure to other viral communicable diseases: Secondary | ICD-10-CM | POA: Diagnosis not present

## 2019-05-15 DIAGNOSIS — R2681 Unsteadiness on feet: Secondary | ICD-10-CM | POA: Diagnosis not present

## 2019-05-15 DIAGNOSIS — E559 Vitamin D deficiency, unspecified: Secondary | ICD-10-CM | POA: Diagnosis not present

## 2019-05-16 ENCOUNTER — Inpatient Hospital Stay (HOSPITAL_COMMUNITY)
Admission: EM | Admit: 2019-05-16 | Discharge: 2019-05-21 | DRG: 291 | Disposition: A | Discharge status: Skilled Nursing Facility | Payer: Medicare HMO | Attending: Family Medicine | Admitting: Family Medicine

## 2019-05-16 DIAGNOSIS — I5033 Acute on chronic diastolic (congestive) heart failure: Secondary | ICD-10-CM

## 2019-05-16 DIAGNOSIS — E1142 Type 2 diabetes mellitus with diabetic polyneuropathy: Secondary | ICD-10-CM | POA: Diagnosis present

## 2019-05-16 DIAGNOSIS — E1151 Type 2 diabetes mellitus with diabetic peripheral angiopathy without gangrene: Secondary | ICD-10-CM | POA: Diagnosis present

## 2019-05-16 DIAGNOSIS — J9622 Acute and chronic respiratory failure with hypercapnia: Secondary | ICD-10-CM | POA: Diagnosis present

## 2019-05-16 DIAGNOSIS — J449 Chronic obstructive pulmonary disease, unspecified: Secondary | ICD-10-CM | POA: Diagnosis present

## 2019-05-16 DIAGNOSIS — Z794 Long term (current) use of insulin: Secondary | ICD-10-CM

## 2019-05-16 DIAGNOSIS — Z91018 Allergy to other foods: Secondary | ICD-10-CM

## 2019-05-16 DIAGNOSIS — J9601 Acute respiratory failure with hypoxia: Secondary | ICD-10-CM

## 2019-05-16 DIAGNOSIS — R000 Tachycardia, unspecified: Secondary | ICD-10-CM | POA: Diagnosis not present

## 2019-05-16 DIAGNOSIS — E66813 Obesity, class 3: Secondary | ICD-10-CM | POA: Diagnosis present

## 2019-05-16 DIAGNOSIS — I4892 Unspecified atrial flutter: Secondary | ICD-10-CM | POA: Diagnosis present

## 2019-05-16 DIAGNOSIS — I10 Essential (primary) hypertension: Secondary | ICD-10-CM | POA: Diagnosis present

## 2019-05-16 DIAGNOSIS — I130 Hypertensive heart and chronic kidney disease with heart failure and stage 1 through stage 4 chronic kidney disease, or unspecified chronic kidney disease: Principal | ICD-10-CM | POA: Diagnosis present

## 2019-05-16 DIAGNOSIS — N184 Chronic kidney disease, stage 4 (severe): Secondary | ICD-10-CM | POA: Diagnosis present

## 2019-05-16 DIAGNOSIS — R0902 Hypoxemia: Secondary | ICD-10-CM | POA: Diagnosis not present

## 2019-05-16 DIAGNOSIS — J9600 Acute respiratory failure, unspecified whether with hypoxia or hypercapnia: Secondary | ICD-10-CM | POA: Diagnosis not present

## 2019-05-16 DIAGNOSIS — J9621 Acute and chronic respiratory failure with hypoxia: Secondary | ICD-10-CM | POA: Diagnosis present

## 2019-05-16 DIAGNOSIS — R0689 Other abnormalities of breathing: Secondary | ICD-10-CM | POA: Diagnosis not present

## 2019-05-16 DIAGNOSIS — F329 Major depressive disorder, single episode, unspecified: Secondary | ICD-10-CM | POA: Diagnosis present

## 2019-05-16 DIAGNOSIS — I509 Heart failure, unspecified: Secondary | ICD-10-CM | POA: Diagnosis not present

## 2019-05-16 DIAGNOSIS — Z9981 Dependence on supplemental oxygen: Secondary | ICD-10-CM

## 2019-05-16 DIAGNOSIS — K219 Gastro-esophageal reflux disease without esophagitis: Secondary | ICD-10-CM | POA: Diagnosis present

## 2019-05-16 DIAGNOSIS — I872 Venous insufficiency (chronic) (peripheral): Secondary | ICD-10-CM | POA: Diagnosis present

## 2019-05-16 DIAGNOSIS — R0602 Shortness of breath: Secondary | ICD-10-CM

## 2019-05-16 DIAGNOSIS — Z888 Allergy status to other drugs, medicaments and biological substances status: Secondary | ICD-10-CM

## 2019-05-16 DIAGNOSIS — Z6839 Body mass index (BMI) 39.0-39.9, adult: Secondary | ICD-10-CM

## 2019-05-16 DIAGNOSIS — E1122 Type 2 diabetes mellitus with diabetic chronic kidney disease: Secondary | ICD-10-CM

## 2019-05-16 DIAGNOSIS — Z993 Dependence on wheelchair: Secondary | ICD-10-CM

## 2019-05-16 DIAGNOSIS — E1121 Type 2 diabetes mellitus with diabetic nephropathy: Secondary | ICD-10-CM

## 2019-05-16 DIAGNOSIS — F17210 Nicotine dependence, cigarettes, uncomplicated: Secondary | ICD-10-CM | POA: Diagnosis present

## 2019-05-16 DIAGNOSIS — Z20828 Contact with and (suspected) exposure to other viral communicable diseases: Secondary | ICD-10-CM | POA: Diagnosis present

## 2019-05-16 DIAGNOSIS — F419 Anxiety disorder, unspecified: Secondary | ICD-10-CM | POA: Diagnosis present

## 2019-05-16 DIAGNOSIS — T07XXXA Unspecified multiple injuries, initial encounter: Secondary | ICD-10-CM

## 2019-05-16 DIAGNOSIS — I69354 Hemiplegia and hemiparesis following cerebral infarction affecting left non-dominant side: Secondary | ICD-10-CM

## 2019-05-16 DIAGNOSIS — Z7982 Long term (current) use of aspirin: Secondary | ICD-10-CM

## 2019-05-16 DIAGNOSIS — I110 Hypertensive heart disease with heart failure: Secondary | ICD-10-CM | POA: Diagnosis not present

## 2019-05-16 DIAGNOSIS — Z79899 Other long term (current) drug therapy: Secondary | ICD-10-CM

## 2019-05-16 HISTORY — DX: Disorder of kidney and ureter, unspecified: N28.9

## 2019-05-16 NOTE — ED Triage Notes (Signed)
Pt arrived to er by ems with sudden onset of sob that started 30 minutes prior to arrival in er, pt pulse ox 46% on 2 liters nasal cannula at [facility], pt normally wears oxygen at 2 liters pt able to answer questions upon arrival to er, resp labored,

## 2019-05-17 ENCOUNTER — Encounter (HOSPITAL_COMMUNITY): Payer: Self-pay | Admitting: *Deleted

## 2019-05-17 ENCOUNTER — Inpatient Hospital Stay (HOSPITAL_COMMUNITY): Payer: Medicare HMO

## 2019-05-17 ENCOUNTER — Other Ambulatory Visit: Payer: Self-pay

## 2019-05-17 ENCOUNTER — Emergency Department (HOSPITAL_COMMUNITY): Payer: Medicare HMO

## 2019-05-17 DIAGNOSIS — F329 Major depressive disorder, single episode, unspecified: Secondary | ICD-10-CM | POA: Diagnosis present

## 2019-05-17 DIAGNOSIS — J9600 Acute respiratory failure, unspecified whether with hypoxia or hypercapnia: Secondary | ICD-10-CM | POA: Diagnosis present

## 2019-05-17 DIAGNOSIS — Z9981 Dependence on supplemental oxygen: Secondary | ICD-10-CM | POA: Diagnosis not present

## 2019-05-17 DIAGNOSIS — K219 Gastro-esophageal reflux disease without esophagitis: Secondary | ICD-10-CM

## 2019-05-17 DIAGNOSIS — F419 Anxiety disorder, unspecified: Secondary | ICD-10-CM | POA: Diagnosis present

## 2019-05-17 DIAGNOSIS — Z7401 Bed confinement status: Secondary | ICD-10-CM | POA: Diagnosis not present

## 2019-05-17 DIAGNOSIS — I4892 Unspecified atrial flutter: Secondary | ICD-10-CM | POA: Diagnosis present

## 2019-05-17 DIAGNOSIS — U071 COVID-19: Secondary | ICD-10-CM | POA: Diagnosis not present

## 2019-05-17 DIAGNOSIS — Z20828 Contact with and (suspected) exposure to other viral communicable diseases: Secondary | ICD-10-CM | POA: Diagnosis not present

## 2019-05-17 DIAGNOSIS — J9601 Acute respiratory failure with hypoxia: Secondary | ICD-10-CM | POA: Diagnosis not present

## 2019-05-17 DIAGNOSIS — I5033 Acute on chronic diastolic (congestive) heart failure: Secondary | ICD-10-CM | POA: Diagnosis not present

## 2019-05-17 DIAGNOSIS — J449 Chronic obstructive pulmonary disease, unspecified: Secondary | ICD-10-CM | POA: Diagnosis not present

## 2019-05-17 DIAGNOSIS — Z794 Long term (current) use of insulin: Secondary | ICD-10-CM | POA: Diagnosis not present

## 2019-05-17 DIAGNOSIS — I69354 Hemiplegia and hemiparesis following cerebral infarction affecting left non-dominant side: Secondary | ICD-10-CM | POA: Diagnosis not present

## 2019-05-17 DIAGNOSIS — Z79899 Other long term (current) drug therapy: Secondary | ICD-10-CM | POA: Diagnosis not present

## 2019-05-17 DIAGNOSIS — R0602 Shortness of breath: Secondary | ICD-10-CM | POA: Diagnosis not present

## 2019-05-17 DIAGNOSIS — E1142 Type 2 diabetes mellitus with diabetic polyneuropathy: Secondary | ICD-10-CM | POA: Diagnosis present

## 2019-05-17 DIAGNOSIS — J9602 Acute respiratory failure with hypercapnia: Secondary | ICD-10-CM | POA: Diagnosis not present

## 2019-05-17 DIAGNOSIS — Z743 Need for continuous supervision: Secondary | ICD-10-CM | POA: Diagnosis not present

## 2019-05-17 DIAGNOSIS — M6281 Muscle weakness (generalized): Secondary | ICD-10-CM | POA: Diagnosis not present

## 2019-05-17 DIAGNOSIS — E1151 Type 2 diabetes mellitus with diabetic peripheral angiopathy without gangrene: Secondary | ICD-10-CM | POA: Diagnosis present

## 2019-05-17 DIAGNOSIS — Z6836 Body mass index (BMI) 36.0-36.9, adult: Secondary | ICD-10-CM | POA: Diagnosis not present

## 2019-05-17 DIAGNOSIS — J9622 Acute and chronic respiratory failure with hypercapnia: Secondary | ICD-10-CM | POA: Diagnosis not present

## 2019-05-17 DIAGNOSIS — J9621 Acute and chronic respiratory failure with hypoxia: Secondary | ICD-10-CM | POA: Diagnosis not present

## 2019-05-17 DIAGNOSIS — N184 Chronic kidney disease, stage 4 (severe): Secondary | ICD-10-CM | POA: Diagnosis not present

## 2019-05-17 DIAGNOSIS — E1122 Type 2 diabetes mellitus with diabetic chronic kidney disease: Secondary | ICD-10-CM | POA: Diagnosis not present

## 2019-05-17 DIAGNOSIS — R69 Illness, unspecified: Secondary | ICD-10-CM | POA: Diagnosis not present

## 2019-05-17 DIAGNOSIS — I130 Hypertensive heart and chronic kidney disease with heart failure and stage 1 through stage 4 chronic kidney disease, or unspecified chronic kidney disease: Secondary | ICD-10-CM | POA: Diagnosis not present

## 2019-05-17 DIAGNOSIS — I872 Venous insufficiency (chronic) (peripheral): Secondary | ICD-10-CM

## 2019-05-17 DIAGNOSIS — Z993 Dependence on wheelchair: Secondary | ICD-10-CM | POA: Diagnosis not present

## 2019-05-17 DIAGNOSIS — N1832 Chronic kidney disease, stage 3b: Secondary | ICD-10-CM | POA: Diagnosis not present

## 2019-05-17 DIAGNOSIS — E1121 Type 2 diabetes mellitus with diabetic nephropathy: Secondary | ICD-10-CM

## 2019-05-17 DIAGNOSIS — F17210 Nicotine dependence, cigarettes, uncomplicated: Secondary | ICD-10-CM | POA: Diagnosis present

## 2019-05-17 DIAGNOSIS — G9340 Encephalopathy, unspecified: Secondary | ICD-10-CM | POA: Diagnosis not present

## 2019-05-17 DIAGNOSIS — Z7982 Long term (current) use of aspirin: Secondary | ICD-10-CM | POA: Diagnosis not present

## 2019-05-17 LAB — COMPREHENSIVE METABOLIC PANEL
ALT: 11 U/L (ref 0–44)
AST: 15 U/L (ref 15–41)
Albumin: 3.3 g/dL — ABNORMAL LOW (ref 3.5–5.0)
Alkaline Phosphatase: 90 U/L (ref 38–126)
Anion gap: 12 (ref 5–15)
BUN: 31 mg/dL — ABNORMAL HIGH (ref 8–23)
CO2: 26 mmol/L (ref 22–32)
Calcium: 8.1 mg/dL — ABNORMAL LOW (ref 8.9–10.3)
Chloride: 103 mmol/L (ref 98–111)
Creatinine, Ser: 3.3 mg/dL — ABNORMAL HIGH (ref 0.61–1.24)
GFR calc Af Amer: 21 mL/min — ABNORMAL LOW (ref >60)
GFR calc non Af Amer: 18 mL/min — ABNORMAL LOW (ref >60)
Glucose, Bld: 291 mg/dL — ABNORMAL HIGH (ref 70–99)
Potassium: 3.6 mmol/L (ref 3.5–5.1)
Sodium: 141 mmol/L (ref 135–145)
Total Bilirubin: 0.5 mg/dL (ref 0.3–1.2)
Total Protein: 7.7 g/dL (ref 6.5–8.1)

## 2019-05-17 LAB — CBG MONITORING, ED: Glucose-Capillary: 148 mg/dL — ABNORMAL HIGH (ref 70–99)

## 2019-05-17 LAB — CBC
HCT: 31.7 % — ABNORMAL LOW (ref 39.0–52.0)
Hemoglobin: 9.4 g/dL — ABNORMAL LOW (ref 13.0–17.0)
MCH: 28 pg (ref 26.0–34.0)
MCHC: 29.7 g/dL — ABNORMAL LOW (ref 30.0–36.0)
MCV: 94.3 fL (ref 80.0–100.0)
Platelets: 504 10*3/uL — ABNORMAL HIGH (ref 150–400)
RBC: 3.36 MIL/uL — ABNORMAL LOW (ref 4.22–5.81)
RDW: 17.4 % — ABNORMAL HIGH (ref 11.5–15.5)
WBC: 16.6 10*3/uL — ABNORMAL HIGH (ref 4.0–10.5)
nRBC: 0.2 % (ref 0.0–0.2)

## 2019-05-17 LAB — SARS CORONAVIRUS 2 BY RT PCR (HOSPITAL ORDER, PERFORMED IN ~~LOC~~ HOSPITAL LAB): SARS Coronavirus 2: NEGATIVE

## 2019-05-17 LAB — BLOOD GAS, VENOUS
Acid-base deficit: 0.4 mmol/L (ref 0.0–2.0)
Bicarbonate: 22.9 mmol/L (ref 20.0–28.0)
Delivery systems: POSITIVE
FIO2: 100
O2 Saturation: 94.4 %
Patient temperature: 36.6
pCO2, Ven: 65.6 mmHg — ABNORMAL HIGH (ref 44.0–60.0)
pH, Ven: 7.227 — ABNORMAL LOW (ref 7.250–7.430)
pO2, Ven: 90 mmHg — ABNORMAL HIGH (ref 32.0–45.0)

## 2019-05-17 LAB — BLOOD GAS, ARTERIAL
Acid-Base Excess: 4 mmol/L — ABNORMAL HIGH (ref 0.0–2.0)
Bicarbonate: 27.4 mmol/L (ref 20.0–28.0)
Delivery systems: POSITIVE
Expiratory PAP: 8
FIO2: 45
Inspiratory PAP: 21
O2 Saturation: 96.9 %
Patient temperature: 37
RATE: 12 resp/min
pCO2 arterial: 54.9 mmHg — ABNORMAL HIGH (ref 32.0–48.0)
pH, Arterial: 7.346 — ABNORMAL LOW (ref 7.350–7.450)
pO2, Arterial: 91.8 mmHg (ref 83.0–108.0)

## 2019-05-17 LAB — TROPONIN I (HIGH SENSITIVITY)
Troponin I (High Sensitivity): 14 ng/L (ref <18)
Troponin I (High Sensitivity): 67 ng/L — ABNORMAL HIGH (ref <18)

## 2019-05-17 LAB — POC SARS CORONAVIRUS 2 AG -  ED: SARS Coronavirus 2 Ag: NEGATIVE

## 2019-05-17 LAB — GLUCOSE, CAPILLARY
Glucose-Capillary: 141 mg/dL — ABNORMAL HIGH (ref 70–99)
Glucose-Capillary: 178 mg/dL — ABNORMAL HIGH (ref 70–99)

## 2019-05-17 LAB — LACTIC ACID, PLASMA
Lactic Acid, Venous: 3.6 mmol/L (ref 0.5–1.9)
Lactic Acid, Venous: 2.3 mmol/L (ref 0.5–1.9)

## 2019-05-17 LAB — BRAIN NATRIURETIC PEPTIDE: B Natriuretic Peptide: 285 pg/mL — ABNORMAL HIGH (ref 0.0–100.0)

## 2019-05-17 MED ORDER — FUROSEMIDE 10 MG/ML IJ SOLN
40.0000 mg | Freq: Two times a day (BID) | INTRAMUSCULAR | Status: DC
  Administered 2019-05-17 – 2019-05-18 (×3): 40 mg via INTRAVENOUS
  Filled 2019-05-17 (×3): qty 4

## 2019-05-17 MED ORDER — ALBUTEROL SULFATE (2.5 MG/3ML) 0.083% IN NEBU
INHALATION_SOLUTION | RESPIRATORY_TRACT | Status: AC
  Administered 2019-05-17: 5 mg
  Filled 2019-05-17: qty 6

## 2019-05-17 MED ORDER — ASPIRIN EC 81 MG PO TBEC
81.0000 mg | DELAYED_RELEASE_TABLET | Freq: Every day | ORAL | Status: DC
  Administered 2019-05-17 – 2019-05-21 (×5): 81 mg via ORAL
  Filled 2019-05-17 (×5): qty 1

## 2019-05-17 MED ORDER — LIDOCAINE 5 % EX PTCH
1.0000 | MEDICATED_PATCH | Freq: Two times a day (BID) | CUTANEOUS | Status: DC
  Administered 2019-05-19 – 2019-05-21 (×3): 1 via TRANSDERMAL
  Filled 2019-05-17 (×13): qty 1

## 2019-05-17 MED ORDER — ALBUTEROL SULFATE (2.5 MG/3ML) 0.083% IN NEBU: 2.5000 mg | INHALATION_SOLUTION | RESPIRATORY_TRACT | Status: DC | PRN

## 2019-05-17 MED ORDER — CHLORHEXIDINE GLUCONATE CLOTH 2 % EX PADS
6.0000 | MEDICATED_PAD | Freq: Every day | CUTANEOUS | Status: DC
  Administered 2019-05-18: 6 via TOPICAL

## 2019-05-17 MED ORDER — IPRATROPIUM BROMIDE 0.02 % IN SOLN
RESPIRATORY_TRACT | Status: AC
  Administered 2019-05-17: 0.5 mg
  Filled 2019-05-17: qty 2.5

## 2019-05-17 MED ORDER — INSULIN ASPART 100 UNIT/ML ~~LOC~~ SOLN: 0.0000 [IU] | Freq: Every day | SUBCUTANEOUS | Status: DC

## 2019-05-17 MED ORDER — MAGNESIUM OXIDE 400 MG PO TABS
400.0000 mg | ORAL_TABLET | Freq: Every day | ORAL | Status: DC
  Administered 2019-05-17 – 2019-05-21 (×5): 400 mg via ORAL
  Filled 2019-05-17 (×11): qty 1

## 2019-05-17 MED ORDER — FUROSEMIDE 10 MG/ML IJ SOLN
40.0000 mg | Freq: Once | INTRAMUSCULAR | Status: AC
  Administered 2019-05-17: 40 mg via INTRAVENOUS
  Filled 2019-05-17: qty 4

## 2019-05-17 MED ORDER — ACETAMINOPHEN 325 MG PO TABS
650.0000 mg | ORAL_TABLET | ORAL | Status: DC | PRN
  Administered 2019-05-17: 650 mg via ORAL

## 2019-05-17 MED ORDER — FLUTICASONE FUROATE-VILANTEROL 100-25 MCG/INH IN AEPB
2.0000 | INHALATION_SPRAY | Freq: Every day | RESPIRATORY_TRACT | Status: DC
  Administered 2019-05-18 – 2019-05-20 (×3): 2 via RESPIRATORY_TRACT
  Filled 2019-05-17 (×2): qty 28

## 2019-05-17 MED ORDER — ACETAMINOPHEN 325 MG PO TABS
650.0000 mg | ORAL_TABLET | Freq: Four times a day (QID) | ORAL | Status: DC | PRN
  Filled 2019-05-17: qty 2

## 2019-05-17 MED ORDER — IPRATROPIUM BROMIDE 0.02 % IN SOLN
RESPIRATORY_TRACT | Status: AC
  Administered 2019-05-17: 0.2 mg
  Filled 2019-05-17: qty 2.5

## 2019-05-17 MED ORDER — HEPARIN SODIUM (PORCINE) 5000 UNIT/ML IJ SOLN
5000.0000 [IU] | Freq: Three times a day (TID) | INTRAMUSCULAR | Status: DC
  Administered 2019-05-17 – 2019-05-21 (×13): 5000 [IU] via SUBCUTANEOUS
  Filled 2019-05-17 (×12): qty 1

## 2019-05-17 MED ORDER — DILTIAZEM HCL 25 MG/5ML IV SOLN
20.0000 mg | Freq: Once | INTRAVENOUS | Status: AC
  Administered 2019-05-17: 20 mg via INTRAVENOUS
  Filled 2019-05-17: qty 5

## 2019-05-17 MED ORDER — INSULIN GLARGINE 100 UNIT/ML ~~LOC~~ SOLN
30.0000 [IU] | Freq: Every day | SUBCUTANEOUS | Status: DC
  Administered 2019-05-17 – 2019-05-20 (×4): 30 [IU] via SUBCUTANEOUS
  Filled 2019-05-17 (×5): qty 0.3

## 2019-05-17 MED ORDER — PANTOPRAZOLE SODIUM 40 MG PO TBEC
40.0000 mg | DELAYED_RELEASE_TABLET | Freq: Every day | ORAL | Status: DC
  Administered 2019-05-18 – 2019-05-21 (×4): 40 mg via ORAL
  Filled 2019-05-17 (×4): qty 1

## 2019-05-17 MED ORDER — SODIUM CHLORIDE 0.9% FLUSH
3.0000 mL | Freq: Two times a day (BID) | INTRAVENOUS | Status: DC
  Administered 2019-05-17 – 2019-05-18 (×3): 3 mL via INTRAVENOUS
  Administered 2019-05-19: 10 mL via INTRAVENOUS
  Administered 2019-05-19 – 2019-05-21 (×3): 3 mL via INTRAVENOUS

## 2019-05-17 MED ORDER — INSULIN ASPART 100 UNIT/ML ~~LOC~~ SOLN
0.0000 [IU] | Freq: Three times a day (TID) | SUBCUTANEOUS | Status: DC
  Administered 2019-05-17 – 2019-05-20 (×9): 2 [IU] via SUBCUTANEOUS
  Administered 2019-05-21: 3 [IU] via SUBCUTANEOUS
  Filled 2019-05-17: qty 1

## 2019-05-17 MED ORDER — ONDANSETRON HCL 4 MG/2ML IJ SOLN: 4.0000 mg | Freq: Four times a day (QID) | INTRAMUSCULAR | Status: DC | PRN

## 2019-05-17 MED ORDER — DULOXETINE HCL 60 MG PO CPEP
60.0000 mg | ORAL_CAPSULE | Freq: Every day | ORAL | Status: DC
  Administered 2019-05-17 – 2019-05-21 (×5): 60 mg via ORAL
  Filled 2019-05-17 (×4): qty 1
  Filled 2019-05-17: qty 2

## 2019-05-17 MED ORDER — SODIUM CHLORIDE 0.9 % IV SOLN: 250.0000 mL | INTRAVENOUS | Status: DC | PRN

## 2019-05-17 MED ORDER — SODIUM CHLORIDE 0.9% FLUSH: 3.0000 mL | INTRAVENOUS | Status: DC | PRN

## 2019-05-17 MED ORDER — HYDROCODONE-ACETAMINOPHEN 5-325 MG PO TABS
1.0000 | ORAL_TABLET | Freq: Three times a day (TID) | ORAL | Status: DC | PRN
  Administered 2019-05-17 – 2019-05-21 (×7): 1 via ORAL
  Filled 2019-05-17 (×7): qty 1

## 2019-05-17 MED ORDER — METOPROLOL TARTRATE 50 MG PO TABS
50.0000 mg | ORAL_TABLET | Freq: Two times a day (BID) | ORAL | Status: DC
  Administered 2019-05-17 – 2019-05-21 (×9): 50 mg via ORAL
  Filled 2019-05-17 (×9): qty 1

## 2019-05-17 MED ORDER — ALPRAZOLAM 0.5 MG PO TABS
0.5000 mg | ORAL_TABLET | Freq: Two times a day (BID) | ORAL | Status: DC | PRN
  Administered 2019-05-17 – 2019-05-21 (×5): 0.5 mg via ORAL
  Filled 2019-05-17 (×5): qty 1

## 2019-05-17 NOTE — ED Notes (Signed)
Upon return from CT scan, Pt is more alert and responding verbally to RN questions. Pt now tolerating 6L Nasal Cannula while awake and Bi-Pap has temporarily been discontinued. Pt does not have any recollection as to why, where or how Eric Scott he has been in the ED. Pt unable to answer what year it is, what hospital he is in, what facility he came from or who the President is at this time. MD Notified.

## 2019-05-17 NOTE — ED Notes (Signed)
Date and time results received: 05/17/19 0630  Test: Lactic Critical Value: 2.3  Name of Provider Notified: Sedonia Small, MD

## 2019-05-17 NOTE — ED Notes (Signed)
Date and time results received: 05/17/19 1:06 AM   (use smartphrase ".now" to insert current time)  Test: lactic  Critical Value: 3.6  Name of Provider Notified: [place]  Orders Received? Or Actions Taken?: Orders Received - See Orders for details

## 2019-05-17 NOTE — ED Provider Notes (Addendum)
[facility] Emergency Department Provider Note MRN:  [id number]  Arrival date & time: 05/17/19     Chief Complaint   Respiratory Distress   History of Present Illness   Eric Scott is a 68 y.o. year-old male with a history of COPD, CHF, diabetes, stroke presenting to the ED with chief complaint of respiratory distress.  Sudden onset respiratory distress 30 minutes prior to arrival, reportedly hypoxic at the skilled nursing facility.  I was unable to obtain an accurate HPI, PMH, or ROS due to the patient's respiratory distress.  Level 5 caveat.  Review of Systems  Positive for respiratory distress.  Patient's Health History    Past Medical History:  Diagnosis Date  . Anxiety   . Chronic pain    legs, back; MRI 05/2012 with mild thoracic degenerative changes no spinal stenosis   . COPD (chronic obstructive pulmonary disease) ([place])   . Depression   . Diabetic peripheral neuropathy ([place])    "chronic" (05/27/2012)  . Diastolic CHF ([place])   . Hypercholesteremia   . Hypertension   . Peripheral edema   . PVD (peripheral vascular disease) ([place])   . Spinal stenosis    mild lumbar (MRI 05/2012)-L2-L3 to L4-L5 , mild lumbar foraminal stenosis   . Stroke ([place]) 05/2012   Subacute, lacunar infarcts within the left basal ganglia and posterior limp of the left internal capsule/thalamus; "RUE; both feet weak" (05/27/2012)  . Tremor   . Type II diabetes mellitus ([place])     Past Surgical History:  Procedure Laterality Date  . ANKLE SURGERY    . ESOPHAGOGASTRODUODENOSCOPY (EGD) WITH PROPOFOL N/A 12/21/2016   Procedure: ESOPHAGOGASTRODUODENOSCOPY (EGD) WITH PROPOFOL;  Surgeon: Daneil Dolin, MD;  Location[facility];  Service: Gastroenterology;  Laterality: N/A;  . ESOPHAGOGASTRODUODENOSCOPY (EGD) WITH PROPOFOL N/A 04/02/2017   Procedure: ESOPHAGOGASTRODUODENOSCOPY (EGD) WITH PROPOFOL;  Surgeon: Danie Binder, MD;  Location: [facility]ITE;  Service:  Endoscopy;  Laterality: N/A;  1030   . HERNIA REPAIR  01/05/2004   "belly button" (05/27/2012)    Family History  Problem Relation Age of Onset  . Colon cancer Neg Hx     Social History   Socioeconomic History  . Marital status: Legally Separated    Spouse name: Not on file  . Number of children: Not on file  . Years of education: Not on file  . Highest education level: Not on file  Occupational History  . Not on file  Tobacco Use  . Smoking status: Current Every Day Smoker    Packs/day: 0.50    Years: 45.00    Pack years: 22.50    Types: Cigarettes    Last attempt to quit: 12/28/2014    Years since quitting: 4.3  . Smokeless tobacco: Never Used  . Tobacco comment: 1/2 pk per day  Substance and Sexual Activity  . Alcohol use: No    Comment: 05/27/2012 "drank gallons and gallons 20 yr ago or so; last drink  at least 10 yr ago"  . Drug use: No  . Sexual activity: Never  Other Topics Concern  . Not on file  Social History Narrative   Patient lives in [place] with a friend.   He is divorced. Has one kid.   Not working for about 7 years now. Used to do plumbing work.   Social Determinants of Health   Financial Resource Strain:   . Difficulty of Paying Living Expenses: Not on file  Food Insecurity:   .  Worried About Charity fundraiser in the Last Year: Not on file  . Ran Out of Food in the Last Year: Not on file  Transportation Needs:   . Lack of Transportation (Medical): Not on file  . Lack of Transportation (Non-Medical): Not on file  Physical Activity:   . Days of Exercise per Week: Not on file  . Minutes of Exercise per Session: Not on file  Stress:   . Feeling of Stress : Not on file  Social Connections:   . Frequency of Communication with Friends and Family: Not on file  . Frequency of Social Gatherings with Friends and Family: Not on file  . Attends Religious Services: Not on file  . Active Member of Clubs or Organizations: Not on file  . Attends English as a second language teacher Meetings: Not on file  . Marital Status: Not on file  Intimate Partner Violence:   . Fear of Current or Ex-Partner: Not on file  . Emotionally Abused: Not on file  . Physically Abused: Not on file  . Sexually Abused: Not on file     Physical Exam  Vital Signs and Nursing Notes reviewed Vitals:   05/17/19 0030 05/17/19 0034  BP: 135/71   Pulse: 85   Resp: (!) 21   Temp:    SpO2: 100% 100%    CONSTITUTIONAL: Ill-appearing, NAD, obese NEURO: Awake, answering yes/no questions, moving all extremities EYES:  eyes equal and reactive ENT/NECK:  no LAD, no JVD CARDIO: Tachycardic rate, well-perfused, normal S1 and S2 PULM:  CTAB no wheezing or rhonchi GI/GU:  normal bowel sounds, non-distended, non-tender MSK/SPINE:  No gross deformities, no edema SKIN:  no rash, atraumatic PSYCH:  Appropriate speech and behavior  Diagnostic and Interventional Summary    EKG Interpretation  Date/Time:  Saturday May 16 2019 23:59:16 EST Ventricular Rate:  149 PR Interval:    QRS Duration: 122 QT Interval:  292 QTC Calculation: 460 R Axis:   -6 Text Interpretation: Atrial flutter with varied AV block, , new Left bundle branch block Confirmed by Gerlene Fee [phone]) on 05/17/2019 12:04:12 AM       EKG Interpretation  Date/Time:  Sunday May 17 2019 00:12:37 EST Ventricular Rate:  92 PR Interval:    QRS Duration: 112 QT Interval:  355 QTC Calculation: 440 R Axis:   -10 Text Interpretation: Atrial fibrillation Incomplete left bundle branch block Low voltage, precordial leads Repol abnrm, severe global ischemia (LM/MVD) Confirmed by Gerlene Fee [phone]) on 05/17/2019 12:48:15 AM       EKG Interpretation  Date/Time:  Sunday May 17 2019 01:07:49 EST Ventricular Rate:  116 PR Interval:    QRS Duration: 132 QT Interval:  388 QTC Calculation: 491 R Axis:   15 Text Interpretation: Atrial fibrillation Left bundle branch block, resolution of ischemic  findings from prior Confirmed by Gerlene Fee [phone]) on 05/17/2019 2:49:16 AM       Labs Reviewed  CBC - Abnormal; Notable for the following components:      Result Value   WBC 16.6 (*)    RBC 3.36 (*)    Hemoglobin 9.4 (*)    HCT 31.7 (*)    MCHC 29.7 (*)    RDW 17.4 (*)    Platelets 504 (*)    All other components within normal limits  BRAIN NATRIURETIC PEPTIDE - Abnormal; Notable for the following components:   B Natriuretic Peptide 285.0 (*)    All other components within normal limits  BLOOD GAS, VENOUS -  Abnormal; Notable for the following components:   pH, Ven 7.227 (*)    pCO2, Ven 65.6 (*)    pO2, Ven 90.0 (*)    All other components within normal limits  SARS CORONAVIRUS 2 (HOSPITAL ORDER, [facility])  SARS CORONAVIRUS 2 (TAT 6-24 HRS)  COMPREHENSIVE METABOLIC PANEL  LACTIC ACID, PLASMA  POC SARS CORONAVIRUS 2 AG -  ED  TROPONIN I (HIGH SENSITIVITY)    XR Chest Single View  Final Result      Medications  diltiazem (CARDIZEM) injection 20 mg (20 mg Intravenous Given 05/17/19 0010)  ipratropium (ATROVENT) 0.02 % nebulizer solution (0.5 mg  Given 05/17/19 0033)  albuterol (PROVENTIL) (2.5 MG/3ML) 0.083% nebulizer solution (5 mg  Given 05/17/19 0032)  furosemide (LASIX) injection 40 mg (40 mg Intravenous Given 05/17/19 0039)     Procedures  /  Critical Care .Critical Care Performed by: Maudie Flakes, MD Authorized by: Maudie Flakes, MD   Critical care provider statement:    Critical care time (minutes):  45   Critical care was necessary to treat or prevent imminent or life-threatening deterioration of the following conditions:  Cardiac failure (Atrial flutter with rapid ventricular response, severe CHF exacerbation)   Critical care was time spent personally by me on the following activities:  Discussions with consultants, evaluation of patient's response to treatment, examination of patient, ordering and performing treatments  and interventions, ordering and review of laboratory studies, ordering and review of radiographic studies, pulse oximetry, re-evaluation of patient's condition, obtaining history from patient or surrogate and review of old charts    ED Course and Medical Decision Making  I have reviewed the triage vital signs and the nursing notes.  Pertinent labs & imaging results that were available during my care of the patient were reviewed by me and considered in my medical decision making (see below for details).     Question of flash pulmonary edema versus new onset atrial flutter causing acute CHF.  Patient's heart rate is 150, EKG is demonstrating known bundle branch block but now with atrial flutter with rapid ventricular response.  Providing BiPAP, will trial dose of diltiazem, may need cardioversion.  12:27 AM update: Patient had good response to single dose of 20 mg diltiazem, now with rates between 80 and 100.  Seems to have converted from atrial flutter to an atrial fibrillation.  Patient continues to have increased work of breathing but is feeling better on BiPAP.  Will provide IV Lasix, anticipating admission to stepdown.  At this time will defer cardioversion to a later time when patient is more stable as cardioversion has become more of an elective procedure.  1:37 AM update: Accepted for stepdown admission by Dr. Almeta Monas.  5 AM update: Patient exhibiting worsening mental status, on arrival was awake, able to respond to yes or no questions.  Now he is somnolent, only wakes to physical stimuli.  Unable to follow commands or answer questions.  Had considered hypercapnia but ABG is actually reassuring.  Obtaining CT head to exclude intracranial process.  Still protecting airway and no need for intubation at this time, but would consider if mental status continues to deteriorate to the point of loss of airway protection.  Attempted to call family to discuss this possibility, no answer.  Barth Kirks.  Sedonia Small, [place] [email or web address]  Final Clinical Impressions(s) / ED Diagnoses     ICD-10-CM   1. Atrial flutter with rapid  ventricular response ([name])  I48.92   2. SOB (shortness of breath)  R06.02 XR Chest Single View    XR Chest Single View  3. Acute on chronic congestive heart failure, unspecified heart failure type ([name])  I50.9   4. Acute respiratory failure with hypoxia [employer])  J96.01     ED Discharge Orders    None       Discharge Instructions Discussed with and Provided to Patient:   Discharge Instructions   None       Maudie Flakes, MD 05/17/19 7915    Maudie Flakes, MD 05/17/19 0413    Maudie Flakes, MD 05/17/19 [phone]

## 2019-05-17 NOTE — ED Notes (Signed)
Patient transported to CT and back. Upon arrival back to er he aroused. Placed on 6 liters for now. Oxygen may need to be decreased. He normally wears 2 liters at nursing home.

## 2019-05-17 NOTE — Progress Notes (Signed)
Patient currently on [facility] and all vitals are stable. BIPAP is not needed at this time. Will continue to monitor.

## 2019-05-17 NOTE — ED Notes (Signed)
POC COVID NEG

## 2019-05-17 NOTE — H&P (Signed)
History and Physical    Eric Scott KZL:[id number] DOB: 29-Nov-1950 DOA: 05/16/2019  Referring MD/NP/PA: Dr. Sedonia Small PCP: [name], MD  Patient coming from: Skilled nursing facility [facility]).  Chief Complaint: Respiratory distress and hypoxia  HPI: Eric Scott is a 68 y.o. male with a past medical history significant for depression/anxiety, type 2 diabetes mellitus with nephropathy and neuropathy; chronic diastolic heart failure, morbid obesity, COPD with chronic respiratory failure on 2 L of oxygen; who presented to the emergency department secondary to worsening shortness of breath.  Apparently patient was in his usual state of health until approximately 30 to 40 minutes prior to his arrival to the ED where he was found in acute respiratory distress and reportedly hypoxic and he is a skilled nursing facility.  There was no fever, chest pain, nausea, vomiting, focal weakness, dysuria, hematuria, melena, hematochezia, productive cough or any other complaints.   Patient ended requiring to be placed on BiPAP secondary to increased respiratory distress and was also found acutely on atrial flutter with a heart rate in the 150s range.  BNP was elevated and chest x-ray confirmed fluid overload status with interstitial edema.  ABG demonstrated mild hypercapnia.  Patient received IV Cardizem and after couple hours on BiPAP was able to be transition to 6 L nasal cannula supplementation.  [facility] has been called admit patient for further evaluation and management.  Past Medical/Surgical History: Past Medical History:  Diagnosis Date  . Anxiety   . Chronic pain    legs, back; MRI 05/2012 with mild thoracic degenerative changes no spinal stenosis   . 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 COPD (chronic obstructive pulmonary disease) ([place])   . Depression   . Diabetic peripheral neuropathy ([place])    "chronic" (05/27/2012)  . Diastolic CHF ([place])   . Hypercholesteremia   . Hypertension     . Peripheral edema   . PVD (peripheral vascular disease) ([place])   . Renal disorder   . Spinal stenosis    mild lumbar (MRI 05/2012)-L2-L3 to L4-L5 , mild lumbar foraminal stenosis   . Stroke ([place]) 05/2012   Subacute, lacunar infarcts within the left basal ganglia and posterior limp of the left internal capsule/thalamus; "RUE; both feet weak" (05/27/2012)  . Tremor   . Type II diabetes mellitus ([place])     Past Surgical History:  Procedure Laterality Date  . ANKLE SURGERY    . ESOPHAGOGASTRODUODENOSCOPY (EGD) WITH PROPOFOL N/A 12/21/2016   Procedure: ESOPHAGOGASTRODUODENOSCOPY (EGD) WITH PROPOFOL;  Surgeon: Daneil Dolin, MD;  [facility];  Service: Gastroenterology;  Laterality: N/A;  . ESOPHAGOGASTRODUODENOSCOPY (EGD) WITH PROPOFOL N/A 04/02/2017   Procedure: ESOPHAGOGASTRODUODENOSCOPY (EGD) WITH PROPOFOL;  Surgeon: Danie Binder, MD;  Location: [facility];  Service: Endoscopy;  Laterality: N/A;  1030   . HERNIA REPAIR  01/05/2004   "belly button" (05/27/2012)    Social History:  reports that he has been smoking cigarettes. He has a 22.50 pack-year smoking history. He has never used smokeless tobacco. He reports that he does not drink alcohol or use drugs.  Allergies: Allergies  Allergen Reactions  . Blueberry Flavor Other (See Comments)    Unknown  . Cucumber Extract Other (See Comments)    "Fells like I'm having a heart attack"  . Flexeril [Cyclobenzaprine Hcl] Other (See Comments)    "whole body  Tremors" (05/27/2012)  . Kiwi Extract Other (See Comments)    "feels like I'm having a heart attack"  Family History:  Family History  Problem Relation Age of Onset  . Colon cancer Neg Hx     Prior to Admission medications   Medication Sig Start Date End Date Taking? Authorizing Provider  acetaminophen (TYLENOL) 325 MG tablet Take 650 mg by mouth every 6 (six) hours as needed.    [provider]  albuterol (PROVENTIL) (2.5 MG/3ML) 0.083% nebulizer  solution Take 2.5 mg by nebulization every 3 (three) hours as needed for wheezing or shortness of breath.    [provider]  ALPRAZolam Duanne Moron) 1 MG tablet Take 1 tablet (1 mg total) by mouth 3 (three) times daily. Patient taking differently: Take 1 mg by mouth 2 (two) times daily. Take 1/2 tablet (0.5mg ) twice daily for anxiety. 12/24/16   Arrien, Jimmy Picket, MD  amLODipine (NORVASC) 10 MG tablet Take 10 mg by mouth daily.    [provider]  aspirin EC 81 MG tablet Take 81 mg by mouth daily.    [provider]  BREO ELLIPTA 100-25 MCG/INH AEPB Inhale 2 puffs into the lungs daily.  09/27/16   [provider]  calcium carbonate (TUMS - DOSED IN MG ELEMENTAL CALCIUM) 500 MG chewable tablet Chew 2 tablets by mouth 2 (two) times daily.    [provider]  cholecalciferol (VITAMIN D) 1000 units tablet Take 3,000 Units by mouth daily.     [provider]  cyanocobalamin 1000 MCG tablet Take 1,000 mcg by mouth daily.    [provider]  DULoxetine (CYMBALTA) 60 MG capsule Take 60 mg by mouth daily.    [provider]  fish oil-omega-3 fatty acids 1000 MG capsule Take 1 g by mouth 2 (two) times daily.    [provider]  furosemide (LASIX) 20 MG tablet Take 1 tablet by mouth daily. 03/31/19   [provider]  furosemide (LASIX) 40 MG tablet Take 40 mg by mouth daily. 03/13/19   [provider]  gabapentin (NEURONTIN) 400 MG capsule Take 400 mg by mouth every 8 (eight) hours.     [provider]  HUMALOG KWIKPEN 100 UNIT/ML KiwkPen Inject 2-14 Units into the skin 3 (three) times daily. Per sliding scale 150-200= 2 units 201-250= 4 units 251-300= 6 units 301-349= 8 units 350-400=10units 401-450=12units 451-500=14units ABOVE 501- CALL MD 10/07/16   [provider]  HYDROcodone-acetaminophen (NORCO/VICODIN) 5-325 MG tablet Take 1 tablet by mouth daily.    [provider]    insulin glargine (LANTUS) 100 UNIT/ML injection Inject 45 Units into the skin at bedtime.     [provider]  lidocaine (LIDODERM) 5 % Place 1 patch onto the skin 2 (two) times daily. Remove & Discard patch within 12 hours or as directed by MD. Apply to lower back twice daily.    [provider]  magnesium oxide (MAG-OX) 400 MG tablet Take 400 mg by mouth daily.    [provider]  metoprolol tartrate (LOPRESSOR) 25 MG tablet Take 1 tablet (25 mg total) by mouth 2 (two) times daily. Patient taking differently: Take 50 mg by mouth 2 (two) times daily.  12/24/16 04/20/19  Arrien, Jimmy Picket, MD  Multiple Vitamin (MULTIVITAMIN) tablet Take 1 tablet by mouth daily.    [provider]  pantoprazole (PROTONIX) 40 MG tablet Take 1 tablet (40 mg total) by mouth daily before breakfast. 04/02/17 04/20/19  Danie Binder, MD    Review of Systems:  Negative except as otherwise mentioned in HPI.    Physical Exam:  Vitals:   05/17/19 0630 05/17/19 0700 05/17/19 0730 05/17/19 0800  BP: (!) 124/57 116/62 134/65 131/62  Pulse: 69 72 74 67  Resp: 13 13 15 13   Temp:      TempSrc:      SpO2: 94% 95% 96% 92%  Weight:      Height:       Constitutional: Somnolent but waking up to voice commands; was oriented x2 and in no acute distress currently.  Denies chest pain.  Using 4-6 L nasal cannula supplementation.  Patient had transient usage of BiPAP previous to my exam.   Eyes: PERRL, lids and conjunctivae normal, no icterus, no nystagmus. ENMT: Mucous membranes are moist. Posterior pharynx clear of any exudate or lesions. Neck: normal, supple, no masses, no thyromegaly; difficult to assess JVD due to body habitus. Respiratory: Fair air movement bilaterally; no wheezing, positive crackles at the bases.  No using accessory muscle. Cardiovascular: Regular rate and rhythm, no murmurs / rubs / gallops.  2+ lower extremity edema bilaterally. Abdomen: Obese, no tenderness,  no masses palpated. No hepatosplenomegaly. Bowel sounds positive.  Musculoskeletal: no clubbing / cyanosis. No joint deformity upper and lower extremities. Good ROM, no contractures. Normal muscle tone.  Skin: No petechiae; positive bilateral lower extremity stasis dermatitis without signs of superimposed infection. Neurologic: CN 2-12 grossly intact. Sensation intact, DTR normal. Strength 5/5 in all 4.  Psychiatric: Normal mood.    Labs on Admission: I have personally reviewed the following labs and imaging studies  CBC: Recent Labs  Lab 05/17/19 0016  WBC 16.6*  HGB 9.4*  HCT 31.7*  MCV 94.3  PLT 650*   Basic Metabolic Panel: Recent Labs  Lab 05/17/19 0016  NA 141  K 3.6  CL 103  CO2 26  GLUCOSE 291*  BUN 31*  CREATININE 3.30*  CALCIUM 8.1*   GFR: Estimated Creatinine Clearance: 31 mL/min (A) (by C-G formula based on SCr of 3.3 mg/dL (H)).   Liver Function Tests: Recent Labs  Lab 05/17/19 0016  AST 15  ALT 11  ALKPHOS 90  BILITOT 0.5  PROT 7.7  ALBUMIN 3.3*   Urine analysis:    Component Value Date/Time   COLORURINE YELLOW 12/10/2016 1041   APPEARANCEUR HAZY (A) 12/10/2016 1041   LABSPEC 1.016 12/10/2016 1041   PHURINE 5.0 12/10/2016 1041   GLUCOSEU NEGATIVE 12/10/2016 1041   HGBUR LARGE (A) 12/10/2016 1041   BILIRUBINUR NEGATIVE 12/10/2016 1041   KETONESUR 5 (A) 12/10/2016 1041   PROTEINUR 100 (A) 12/10/2016 1041   UROBILINOGEN 0.2 03/04/2015 1835   NITRITE NEGATIVE 12/10/2016 1041   LEUKOCYTESUR TRACE (A) 12/10/2016 1041    Recent Results (from the past 240 hour(s))  SARS Coronavirus 2 by RT PCR (hospital order, performed in [place] hospital lab)     Status: None   Collection Time: 05/17/19 12:09 AM  Result Value Ref Range Status   SARS Coronavirus 2 NEGATIVE NEGATIVE Final    Comment: (NOTE) SARS-CoV-2 target nucleic acids are NOT DETECTED. The SARS-CoV-2 RNA is generally detectable in upper and lower respiratory specimens during the  acute phase of infection. The lowest concentration of SARS-CoV-2 viral copies this assay can detect is 250 copies / mL. A negative result does not preclude SARS-CoV-2 infection and should not be used as the sole basis for treatment or other patient management decisions.  A negative result may occur with improper specimen collection / handling, submission of specimen other than nasopharyngeal swab, presence of viral mutation(s) within the areas targeted  by this assay, and inadequate number of viral copies (<250 copies / mL). A negative result must be combined with clinical observations, patient history, and epidemiological information. Fact Sheet for Patients:   StrictlyIdeas.no Fact Sheet for Healthcare Providers: BankingDealers.co.za This test is not yet approved or cleared  by the [place] FDA and has been authorized for detection and/or diagnosis of SARS-CoV-2 by FDA under an Emergency Use Authorization (EUA).  This EUA will remain in effect (meaning this test can be used) for the duration of the COVID-19 declaration under Section 564(b)(1) of the Act, 21 U.S.C. section 360bbb-3(b)(1), unless the authorization is terminated or revoked sooner. Performed at [facility], [address], [place], [place] [address]      Radiological Exams on Admission: CT Head  Result Date: 05/17/2019 CLINICAL DATA:  Encephalopathy EXAM: CT HEAD WITHOUT CONTRAST TECHNIQUE: Contiguous axial images were obtained from the base of the skull through the vertex without intravenous contrast. COMPARISON:  12/10/2016 FINDINGS: Brain: There is no mass, hemorrhage or extra-axial collection. There is generalized atrophy without lobar predilection. Hypodensity of the white matter is most commonly associated with chronic microvascular disease. Old small vessel infarct of the right centrum semiovale and left thalamus. Vascular: No abnormal hyperdensity of the major  intracranial arteries or dural venous sinuses. No intracranial atherosclerosis. Skull: The visualized skull base, calvarium and extracranial soft tissues are normal. Sinuses/Orbits: No fluid levels or advanced mucosal thickening of the visualized paranasal sinuses. No mastoid or middle ear effusion. The orbits are normal. IMPRESSION: Generalized atrophy and chronic microvascular ischemia without acute intracranial abnormality. Electronically Signed   By: Ulyses Jarred M.D.   On: 05/17/2019 05:50   XR Chest Single View  Result Date: 05/17/2019 CLINICAL DATA:  Sudden onset of shortness of breath EXAM: PORTABLE CHEST 1 VIEW COMPARISON:  04/21/2019 FINDINGS: Cardiac shadow remains enlarged. Increasing vascular congestion is noted with interstitial edema consistent with CHF. No focal infiltrate is noted. No effusion is noted. The previously seen nodular density is obscured by the vascular changes. IMPRESSION: Changes of CHF. Electronically Signed   By: Inez Catalina M.D.   On: 05/17/2019 00:36    EKG: Independently reviewed.  Initially demonstrating atrial flutter; after Cardizem given back into sinus rhythm, rate controlled, no acute ischemic changes.  Assessment/Plan 1-acute on chronic respiratory failure with hypoxia/hypercapnia -Patient initial presentation demonstrating interstitial edema on chest x-ray, increase shortness of breath, crackles and elevated BNP. -There was also complaining of hypercapnia and ended requiring transient use of BiPAP. -Start IV diuretics, low-sodium diet, daily weights, strict I's and O's -As needed BiPAP will be used. -Recent 2D echo done demonstrating diastolic heart failure with 6065 ejection fraction and no wall motion and normalities.  There was a severely increased left ventricular hypertrophy. -Cardiology service has been consulted. -Negative troponin.  2-acute on chronic diastolic heart failure -Treatment as mentioned above -IV diuresis -Daily weights,  low-sodium diet, strict I's and O's and cardiology consultation. -Continue beta-blocker.  3-transient atrial flutter: questionable paroxysmal, vs triggered by CHF exacerbation. -Excellent response to Cardizem IV -Patient back to sinus rhythm at this time. -Resume beta-blocker, dose has been adjusted to 50 mg twice a day -Cardiology has been consulted and will follow further recommendations. -Patient with high risk given obesity most likely underlying obstructive sleep apnea/hypoventilation syndrome.  4-Type 2 diabetes with nephropathy ([name]) -Continue sliding scale insulin and Lantus -Modified carbohydrate diet has been ordered  5-HTN (hypertension) -Stable overall -Continue current antihypertensive regimen -Follow vital signs.  6-Chronic venous insufficiency -  With chronic stasis dermatitis and open wounds on bilateral lower extremities; no signs of superimposed infection. -Will ask wound care services to assist with chronic wrapping therapy.  7-GERD -continue PPI  8-Obesity, Class III, BMI 40-49.9 (morbid obesity) ([name]) -Low calorie diet, portion control and increase physical activity has been discussed with patient.  9-depression/anxiety -Continue Celexa -Will allow patient to wake up further; and so will hold on benzodiazepines initially.  DVT prophylaxis: Heparin Code Status: Full code Family Communication: No family at bedside. Disposition Plan: Anticipate discharge back to skilled nursing facility once respiratory status and fluid overload condition is stabilized. Consults called: Cardiology service. Admission status: Inpatient, length of stay more than 2 midnight; stepdown bed.   Time Spent: 70 minutes  Barton Dubois MD [facility] Pager [phone]   05/17/2019, 9:47 AM

## 2019-05-17 NOTE — Progress Notes (Signed)
Oxygen decreased to 2L via nasal cannula. Patient is on 2L oxygen chronically. Oxygen sat above 95% at this time will continue to monitor.

## 2019-05-17 NOTE — Progress Notes (Signed)
Patient reports smoking 1/2 to 1 pack per day of cigarettes. Requests nicotine patch. MD paged for nicotine patch. Awaiting response/orders at this time.

## 2019-05-18 ENCOUNTER — Inpatient Hospital Stay (HOSPITAL_COMMUNITY): Payer: Medicare HMO

## 2019-05-18 LAB — BASIC METABOLIC PANEL
Anion gap: 11 (ref 5–15)
BUN: 31 mg/dL — ABNORMAL HIGH (ref 8–23)
CO2: 31 mmol/L (ref 22–32)
Calcium: 8.4 mg/dL — ABNORMAL LOW (ref 8.9–10.3)
Chloride: 101 mmol/L (ref 98–111)
Creatinine, Ser: 2.98 mg/dL — ABNORMAL HIGH (ref 0.61–1.24)
GFR calc Af Amer: 24 mL/min — ABNORMAL LOW (ref >60)
GFR calc non Af Amer: 21 mL/min — ABNORMAL LOW (ref >60)
Glucose, Bld: 114 mg/dL — ABNORMAL HIGH (ref 70–99)
Potassium: 3.9 mmol/L (ref 3.5–5.1)
Sodium: 143 mmol/L (ref 135–145)

## 2019-05-18 LAB — GLUCOSE, CAPILLARY
Glucose-Capillary: 95 mg/dL (ref 70–99)
Glucose-Capillary: 150 mg/dL — ABNORMAL HIGH (ref 70–99)
Glucose-Capillary: 126 mg/dL — ABNORMAL HIGH (ref 70–99)
Glucose-Capillary: 132 mg/dL — ABNORMAL HIGH (ref 70–99)

## 2019-05-18 LAB — MRSA PCR SCREENING: MRSA by PCR: POSITIVE — AB

## 2019-05-18 MED ORDER — MUPIROCIN 2 % EX OINT
1.0000 "application " | TOPICAL_OINTMENT | Freq: Two times a day (BID) | CUTANEOUS | Status: DC
  Administered 2019-05-18 – 2019-05-21 (×7): 1 via NASAL
  Filled 2019-05-18 (×5): qty 22

## 2019-05-18 MED ORDER — CHLORHEXIDINE GLUCONATE CLOTH 2 % EX PADS
6.0000 | MEDICATED_PAD | Freq: Every day | CUTANEOUS | Status: DC
  Administered 2019-05-19 – 2019-05-21 (×2): 6 via TOPICAL

## 2019-05-18 MED ORDER — FUROSEMIDE 10 MG/ML IJ SOLN
40.0000 mg | Freq: Two times a day (BID) | INTRAMUSCULAR | Status: DC
  Administered 2019-05-18 – 2019-05-21 (×6): 40 mg via INTRAVENOUS
  Filled 2019-05-18 (×6): qty 4

## 2019-05-18 NOTE — Progress Notes (Signed)
PROGRESS NOTE    Eric Scott  HYW:[id number] DOB: 04/11/1951 DOA: 05/16/2019 PCP: [name], MD    Assessment & Plan:   Principal Problem:   Acute respiratory failure [facility]) Active Problems:   Type 2 diabetes with nephropathy ([place])   HTN (hypertension)   Acute on chronic diastolic CHF (congestive heart failure) ([name])   Chronic venous insufficiency   GERD (gastroesophageal reflux disease)   Obesity, Class III, BMI 40-49.9 (morbid obesity) ([name])   Atrial flutter, paroxysmal ([name])    Eric Scott is a 68 y.o. 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     # acute on chronic respiratory failure with hypoxia/hypercapnia 2/2 pulm edema # acute on chronic diastolic heart failure -Patient initial presentation demonstrating interstitial edema on chest x-ray, increase shortness of breath, crackles and elevated BNP. -There was also complaining of hypercapnia and ended requiring transient use of BiPAP. -Recent 2D echo done demonstrating diastolic heart failure with 60-65 ejection fraction and no wall motion and normalities.   PLAN: -continue IV lasix 40 mg BID --low-sodium diet, daily weights, strict I's and O's -As needed BiPAP will be used. -Continue beta-blocker.  # transient atrial flutter: questionable paroxysmal, vs triggered by CHF exacerbation. -Excellent response to Cardizem IV.  Patient back to sinus rhythm. -continue beta-blocker at 50 mg twice a day  # Type 2 diabetes with nephropathy ([name]) -Continue sliding scale insulin and Lantus -Modified carbohydrate diet has been ordered  5-HTN (hypertension) -Stable overall -Continue current antihypertensive regimen -Follow vital  signs.  # Chronic venous insufficiency # chronic stasis dermatitis and open wounds on bilateral lower extremities --no signs of superimposed infection. -wound care services to assist with chronic wrapping therapy.  7-GERD -continue PPI  8-Obesity, Class III, BMI 40-49.9 (morbid obesity) ([name]) -Low calorie diet, portion control and increase physical activity has been discussed with patient.  9-depression/anxiety -Continue Celexa -hold benzodiazepines for now due to initial hypercapneia.  DVT prophylaxis: Heparin Code Status: Full code Family Communication: not today Disposition Plan: Anticipate discharge back to skilled nursing facility once respiratory status and fluid overload condition is stabilized. Admission status: Inpatient, length of stay more than 2 midnight; stepdown bed.   Subjective and Interval History:  Pt reported breathing better, O2 requirement had gone down.  Pt said he "pees more here."  No fever, chest pain, abdominal pain, N/V/D, dysuria.   Objective: Vitals:   05/19/19 1100 05/19/19 1108 05/19/19 1200 05/19/19 1300  BP:      Pulse: (!) 59 63 67 65  Resp: 17 16 17 13   Temp:  97.9 F (36.6 C)    TempSrc:  Oral    SpO2: 91% (!) 89% 92% 92%  Weight:      Height:        Intake/Output Summary (Last 24 hours) at 05/19/2019 1437 Last data filed at 05/19/2019 1112 Gross per 24 hour  Intake --  Output 1450 ml  Net -1450 ml   Filed Weights   05/17/19 1614 05/18/19 0500 05/19/19 0500  Weight: (!) 141.9 kg (!) 142.5 kg (!) 137.8 kg    Examination:   Constitutional: NAD, AAOx3 HEENT: conjunctivae and lids normal, EOMI CV: RRR no M,R,G. Distal pulses +2.  No cyanosis.   RESP: CTA B/L over anterior,  normal respiratory effort  GI: +BS, NTND Extremities: BLE swelling with oozing SKIN: warm, dry and intact Neuro: II - XII grossly intact.  Sensation intact Psych: Flat mood and affect.     Data Reviewed: I have personally reviewed following labs  and imaging studies  CBC: Recent Labs  Lab 05/17/19 0016 05/19/19 0529  WBC 16.6* 9.6  HGB 9.4* 8.4*  HCT 31.7* 27.8*  MCV 94.3 94.2  PLT 504* 967   Basic Metabolic Panel: Recent Labs  Lab 05/17/19 0016 05/18/19 0417 05/19/19 0529  NA 141 143 141  K 3.6 3.9 4.1  CL 103 101 101  CO2 26 31 32  GLUCOSE 291* 114* 112*  BUN 31* 31* 30*  CREATININE 3.30* 2.98* 3.17*  CALCIUM 8.1* 8.4* 8.7*  MG  --   --  1.9   GFR: Estimated Creatinine Clearance: 32.1 mL/min (A) (by C-G formula based on SCr of 3.17 mg/dL (H)). Liver Function Tests: Recent Labs  Lab 05/17/19 0016  AST 15  ALT 11  ALKPHOS 90  BILITOT 0.5  PROT 7.7  ALBUMIN 3.3*   No results for input(s): LIPASE, AMYLASE in the last 168 hours. No results for input(s): AMMONIA in the last 168 hours. Coagulation Profile: No results for input(s): INR, PROTIME in the last 168 hours. Cardiac Enzymes: No results for input(s): CKTOTAL, CKMB, CKMBINDEX, TROPONINI in the last 168 hours. BNP (last 3 results) No results for input(s): PROBNP in the last 8760 hours. HbA1C: No results for input(s): HGBA1C in the last 72 hours. CBG: Recent Labs  Lab 05/18/19 1158 05/18/19 1715 05/18/19 2138 05/19/19 0733 05/19/19 1110  GLUCAP 150* 126* 132* 92 130*   Lipid Profile: No results for input(s): CHOL, HDL, LDLCALC, TRIG, CHOLHDL, LDLDIRECT in the last 72 hours. Thyroid Function Tests: No results for input(s): TSH, T4TOTAL, FREET4, T3FREE, THYROIDAB in the last 72 hours. Anemia Panel: No results for input(s): VITAMINB12, FOLATE, FERRITIN, TIBC, IRON, RETICCTPCT in the last 72 hours. Sepsis Labs: Recent Labs  Lab 05/17/19 0016 05/17/19 0550  LATICACIDVEN 3.6* 2.3*    Recent Results (from the past 240 hour(s))  SARS Coronavirus 2 by RT PCR (hospital order, performed in [place] hospital lab)     Status: None   Collection Time: 05/17/19 12:09 AM  Result Value Ref Range Status   SARS Coronavirus 2 NEGATIVE NEGATIVE  Final    Comment: (NOTE) SARS-CoV-2 target nucleic acids are NOT DETECTED. The SARS-CoV-2 RNA is generally detectable in upper and lower respiratory specimens during the acute phase of infection. The lowest concentration of SARS-CoV-2 viral copies this assay can detect is 250 copies / mL. A negative result does not preclude SARS-CoV-2 infection and should not be used as the sole basis for treatment or other patient management decisions.  A negative result may occur with improper specimen collection / handling, submission of specimen other than nasopharyngeal swab, presence of viral mutation(s) within the areas targeted by this assay, and inadequate number of viral copies (<250 copies / mL). A negative result must be combined with clinical observations, patient history, and epidemiological information. Fact Sheet for Patients:   StrictlyIdeas.no Fact Sheet for Healthcare Providers: BankingDealers.co.za This test is not yet approved or cleared  by the [place] FDA and has been authorized for detection and/or diagnosis of SARS-CoV-2 by FDA under an Emergency Use Authorization (EUA).  This EUA will remain in effect (meaning this test can be used) for the duration of the COVID-19 declaration under Section 564(b)(1) of the Act,  21 U.S.C. section 360bbb-3(b)(1), unless the authorization is terminated or revoked sooner. Performed at [facility], [address] [place], [place] [address]   MRSA PCR Screening     Status: Abnormal   Collection Time: 05/17/19  4:24 PM   Specimen: Nasal Mucosa; Nasopharyngeal  Result Value Ref Range Status   MRSA by PCR POSITIVE (A) NEGATIVE Final    Comment:        The GeneXpert MRSA Assay (FDA approved for NASAL specimens only), is one component of a comprehensive MRSA colonization surveillance program. It is not intended to diagnose MRSA infection nor to guide or monitor treatment for MRSA  infections. RESULT CALLED TO, READ BACK BY AND VERIFIED WITH: WILSON A. T [id number] ON [id number] BY THOMPSON S. Performed at [facility], [address] [place], [place] [address]       Radiology Studies: [facility] 1 View  Result Date: 05/18/2019 CLINICAL DATA:  Shortness of breath. EXAM: PORTABLE CHEST 1 VIEW COMPARISON:  Radiograph yesterday. Additional prior exams. CT 07/20/2014 FINDINGS: Cardiomegaly is unchanged. Unchanged vascular congestion and pulmonary edema. No acute or confluent airspace disease. No pneumothorax or large pleural effusion. Stable osseous structures. IMPRESSION: No significant change from yesterday. Unchanged cardiomegaly, vascular congestion, and pulmonary edema. No new abnormality. Electronically Signed   By: Keith Rake M.D.   On: 05/18/2019 05:39     Scheduled Meds: . aspirin EC  81 mg Oral Daily  . Chlorhexidine Gluconate Cloth  6 each Topical Q0600  . DULoxetine  60 mg Oral Daily  . fluticasone furoate-vilanterol  2 puff Inhalation Daily  . furosemide  40 mg Intravenous BID  . heparin  5,000 Units Subcutaneous Q8H  . insulin aspart  0-15 Units Subcutaneous TID WC  . insulin aspart  0-5 Units Subcutaneous QHS  . insulin glargine  30 Units Subcutaneous QHS  . lidocaine  1 patch Transdermal BID  . magnesium oxide  400 mg Oral Daily  . metoprolol tartrate  50 mg Oral BID  . mupirocin ointment  1 application Nasal BID  . pantoprazole  40 mg Oral QAC breakfast  . sodium chloride flush  3 mL Intravenous Q12H   Continuous Infusions: . sodium chloride       LOS: 2 days     Enzo Bi, MD [facility] If 7PM-7AM, please contact night-coverage 05/19/2019, 2:37 PM

## 2019-05-18 NOTE — Consult Note (Addendum)
WOC consult requested for bilat legs.  Performed using a remote camera system in the ICU and assistance from bedside nurse for measurements.   Pt has scattered patchy areas of partial thickness stasis ulcers to right anterior leg; affected area is approx 4X4X.1cm, dry and pink.   Left anterior calf with ruptured blister which has evolved into a partial thickness wound; 2X2X.1cm, red and moist.  Bilat legs with generalized darker-colored skin; appearance is consistent with chronic venous stasis changes, and mild edema. Compression wraps are not indicated at this time.  Plan: Topical treatment orders provided for bedside nurses to perform daily as follows: foam dressings to protect and promote healing.   Please re-consult if further assistance is needed.  Thank-you,  Julien Girt MSN, [place], [place], [place], [place]

## 2019-05-18 NOTE — TOC Initial Note (Signed)
Transition of Care [facility]) - Initial/Assessment Note    Patient Details  Name: Eric Scott MRN: [id number] Date of Birth: 1950-10-07  Transition of [facility]) CM/SW Contact:    Massimiliano Rohleder, Chauncey Reading, RN Phone Number: 05/18/2019, 12:52 PM  Clinical Narrative:           CM consult received. Patient is a [facility] term resident at [facility]. CM will facilitate return when medically ready.         Expected Discharge Plan: Skilled Nursing Facility Barriers to Discharge: Continued Medical Work up   Expected Discharge Plan and Services Expected Discharge Plan: [place]   Discharge Planning Services: CM Consult                             Activities of Daily Living Home Assistive Devices/Equipment: None ADL Screening (condition at time of admission) Patient's cognitive ability adequate to safely complete daily activities?: Yes Is the patient deaf or have difficulty hearing?: No Does the patient have difficulty seeing, even when wearing glasses/contacts?: No Does the patient have difficulty concentrating, remembering, or making decisions?: No Patient able to express need for assistance with ADLs?: Yes Does the patient have difficulty dressing or bathing?: Yes Independently performs ADLs?: No Communication: Independent Dressing (OT): Needs assistance Is this a change from baseline?: Pre-admission baseline Grooming: Needs assistance Is this a change from baseline?: Pre-admission baseline Feeding: Independent Is this a change from baseline?: Pre-admission baseline Bathing: Needs assistance Is this a change from baseline?: Pre-admission baseline Toileting: Needs assistance Is this a change from baseline?: Pre-admission baseline In/Out Bed: Needs assistance Is this a change from baseline?: Pre-admission baseline Walks in Home: Dependent Is this a change from baseline?: Pre-admission baseline Does the patient have difficulty walking or climbing stairs?:  Yes Weakness of Legs: Both Weakness of Arms/Hands: None           Admission diagnosis:  Acute respiratory failure ([name]) [J96.00] SOB (shortness of breath) [R06.02] Atrial flutter with rapid ventricular response ([name]) [I48.92] Acute respiratory failure with hypoxia ([place]) [J96.01] Acute on chronic congestive heart failure, unspecified heart failure type ([place]) [I50.9] Patient Active Problem List   Diagnosis Date Noted  . Acute respiratory failure ([place]) 05/17/2019  . Obesity, Class III, BMI 40-49.9 (morbid obesity) ([place]) 05/17/2019  . Atrial flutter, paroxysmal ([place]) 05/17/2019  . COPD with acute exacerbation ([place]) 04/20/2019  . GERD (gastroesophageal reflux disease) 08/08/2018  . PUD (peptic ulcer disease)   . Gastric erosions 02/13/2017  . Duodenal ulcer 02/13/2017  . Transaminitis   . UGI bleed   . Arm DVT (deep venous thromboembolism), acute, right ([place]) 12/18/2016  . Uremia 12/10/2016  . Acute renal failure ([place])   . Volume depletion 11/23/2016  . Hypotension   . Liver enzyme elevation   . 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 Arterial insufficiency of lower extremity ([place]) 11/15/2016  . Pressure injury of skin 11/09/2016  . Sepsis ([place]) 11/09/2016  . Osteomyelitis ([place]) 11/09/2016  . Wounds, multiple 11/08/2016  . Chronic venous insufficiency 10/12/2016  . Critical lower limb ischemia 10/12/2016  . Fatty liver 09/10/2016  . History of colonic polyps 09/10/2016  . Gallstone 09/10/2016  . Edema 03/04/2015  . AKI (acute kidney injury) ([place]) 03/04/2015  . Fall 03/04/2015  . Generalized weakness 03/04/2015  . Diabetic polyneuropathy associated with type 2 diabetes mellitus ([place])   . Anxiety   . PVD (peripheral  vascular disease) ([place])   . Pneumonia 07/20/2014  . Weight gain 05/02/2014  . Coarse tremors 04/18/2014  . Anxiety state 04/18/2014  . Acute respiratory failure with hypoxia ([place])  04/07/2014  . Acute on chronic diastolic CHF (congestive heart failure) ([place]) 04/07/2014  . Solitary pulmonary nodule 04/07/2014  . COPD with exacerbation ([place]) 04/02/2014  . Hypoxia 04/02/2014  . CVA (cerebral infarction) 06/01/2012  . Chronic pain (back, legs) 05/30/2012  . Toe laceration, 4th toe 05/30/2012  . C. difficile diarrhea 05/30/2012  . Hypokalemia 05/30/2012  . Acute lacunar stroke ([place]) 05/27/2012  . Type 2 diabetes with nephropathy ([place]) 05/27/2012  . Smoker 05/27/2012  . HTN (hypertension) 05/27/2012   PCP:  [name], MD Pharmacy:   [facility], [address] [place] [address] Phone: [phone] Fax: [phone]  [facility] [place], [place] [address] [place] [address] Phone: [phone] Fax: [phone]     Social Determinants of Health ([place]) Interventions    Readmission Risk Interventions Readmission Risk Prevention Plan 04/22/2019  Transportation Screening Complete  Medication Review Press photographer) Complete  PCP or Specialist appointment within 3-5 days of discharge Not Complete  PCP/Specialist Appt Not Complete comments SNF MD  [place] or [place] Not Complete  HRI or Home Care Consult Pt Refusal Comments from SNF  SW Recovery Care/Counseling Consult Complete  Palliative Care Screening Not [place] Complete  Some recent data might be hidden

## 2019-05-18 NOTE — NC FL2 (Signed)
[place] MEDICAID FL2 LEVEL OF CARE SCREENING TOOL     IDENTIFICATION  Patient Name: Eric Scott Birthdate: December 31, 1950 Sex: male Admission Date (Current Location): 05/16/2019  [facility] and [place] Number:  [employer] and Address:  [facility] [address], [place]      Provider Number: [id number]  Attending Physician Name and Address:  Enzo Bi, MD  Relative Name and Phone Number:       Current Level of Care: Hospital Recommended Level of Care: [place] Prior Approval Number:    Date Approved/Denied:   PASRR Number:    Discharge Plan: SNF    Current Diagnoses: Patient Active Problem List   Diagnosis Date Noted  . Acute respiratory failure ([place]) 05/17/2019  . Obesity, Class III, BMI 40-49.9 (morbid obesity) ([place]) 05/17/2019  . Atrial flutter, paroxysmal ([place]) 05/17/2019  . COPD with acute exacerbation ([place]) 04/20/2019  . GERD (gastroesophageal reflux disease) 08/08/2018  . PUD (peptic ulcer disease)   . Gastric erosions 02/13/2017  . Duodenal ulcer 02/13/2017  . Transaminitis   . UGI bleed   . Arm DVT (deep venous thromboembolism), acute, right ([place]) 12/18/2016  . Uremia 12/10/2016  . Acute renal failure ([place])   . Volume depletion 11/23/2016  . Hypotension   . Liver enzyme elevation   . 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 Arterial insufficiency of lower extremity ([place]) 11/15/2016  . Pressure injury of skin 11/09/2016  . Sepsis ([place]) 11/09/2016  . Osteomyelitis ([place]) 11/09/2016  . Wounds, multiple 11/08/2016  . Chronic venous insufficiency 10/12/2016  . Critical lower limb ischemia 10/12/2016  . Fatty liver 09/10/2016  . History of colonic polyps 09/10/2016  . Gallstone 09/10/2016  . Edema 03/04/2015  . AKI (acute kidney injury) ([place]) 03/04/2015  . Fall 03/04/2015  . Generalized weakness 03/04/2015  . Diabetic  polyneuropathy associated with type 2 diabetes mellitus ([place])   . Anxiety   . PVD (peripheral vascular disease) ([place])   . Pneumonia 07/20/2014  . Weight gain 05/02/2014  . Coarse tremors 04/18/2014  . Anxiety state 04/18/2014  . Acute respiratory failure with hypoxia ([place]) 04/07/2014  . Acute on chronic diastolic CHF (congestive heart failure) ([place]) 04/07/2014  . Solitary pulmonary nodule 04/07/2014  . COPD with exacerbation ([place]) 04/02/2014  . Hypoxia 04/02/2014  . CVA (cerebral infarction) 06/01/2012  . Chronic pain (back, legs) 05/30/2012  . Toe laceration, 4th toe 05/30/2012  . C. difficile diarrhea 05/30/2012  . Hypokalemia 05/30/2012  . Acute lacunar stroke ([place]) 05/27/2012  . Type 2 diabetes with nephropathy ([place]) 05/27/2012  . Smoker 05/27/2012  . HTN (hypertension) 05/27/2012    Orientation RESPIRATION BLADDER Height & Weight     Self, Time, Situation  O2 Continent Weight: (!) 142.5 kg Height:  6' (182.9 cm)  BEHAVIORAL SYMPTOMS/MOOD NEUROLOGICAL BOWEL NUTRITION STATUS      Continent Diet  AMBULATORY STATUS COMMUNICATION OF NEEDS Skin   Extensive Assist Verbally                         Personal Care Assistance Level of Assistance  Bathing, Dressing, Feeding Bathing Assistance: Limited assistance Feeding assistance: Independent Dressing Assistance: Limited assistance     Functional Limitations Info  Sight, Hearing, Speech Sight Info: Adequate Hearing Info: Adequate Speech Info: Adequate    SPECIAL CARE FACTORS FREQUENCY  Contractures Contractures Info: Not present    Additional Factors Info  Code Status, Allergies, Psychotropic Code Status Info: Full Allergies Info: blueberry flavor, cucumber extract, flexeril, kiwi extract           Current Medications (05/18/2019):  This is the current hospital active medication list Current Facility-Administered Medications  Medication Dose Route Frequency Provider Last Rate  Last Admin  . 0.9 %  sodium chloride infusion  250 mL Intravenous PRN Barton Dubois, MD      . acetaminophen (TYLENOL) tablet 650 mg  650 mg Oral Q6H PRN Barton Dubois, MD      . acetaminophen (TYLENOL) tablet 650 mg  650 mg Oral Q4H PRN Barton Dubois, MD   650 mg at 05/17/19 2017  . albuterol (PROVENTIL) (2.5 MG/3ML) 0.083% nebulizer solution 2.5 mg  2.5 mg Nebulization Q3H PRN Barton Dubois, MD      . ALPRAZolam Duanne Moron) tablet 0.5 mg  0.5 mg Oral Q12H PRN Barton Dubois, MD   0.5 mg at 05/18/19 0607  . aspirin EC tablet 81 mg  81 mg Oral Daily Barton Dubois, MD   81 mg at 05/18/19 5188  . Chlorhexidine Gluconate Cloth 2 % PADS 6 each  6 each Topical Q0600 Enzo Bi, MD      . DULoxetine (CYMBALTA) DR capsule 60 mg  60 mg Oral Daily Barton Dubois, MD   60 mg at 05/18/19 0835  . fluticasone furoate-vilanterol (BREO ELLIPTA) 100-25 MCG/INH 2 puff  2 puff Inhalation Daily Barton Dubois, MD   2 puff at 05/18/19 1011  . furosemide (LASIX) injection 40 mg  40 mg Intravenous Q12H Barton Dubois, MD   40 mg at 05/18/19 0835  . heparin injection 5,000 Units  5,000 Units Subcutaneous Q8H Barton Dubois, MD   5,000 Units at 05/18/19 1356  . HYDROcodone-acetaminophen (NORCO/VICODIN) 5-325 MG per tablet 1 tablet  1 tablet Oral Q8H PRN Barton Dubois, MD   1 tablet at 05/18/19 0607  . insulin aspart (novoLOG) injection 0-15 Units  0-15 Units Subcutaneous TID WC Barton Dubois, MD   2 Units at 05/18/19 1219  . insulin aspart (novoLOG) injection 0-5 Units  0-5 Units Subcutaneous QHS Barton Dubois, MD      . insulin glargine (LANTUS) injection 30 Units  30 Units Subcutaneous QHS Barton Dubois, MD   30 Units at 05/17/19 2300  . lidocaine (LIDODERM) 5 % 1 patch  1 patch Transdermal BID Barton Dubois, MD      . magnesium oxide (MAG-OX) tablet 400 mg  400 mg Oral Daily Barton Dubois, MD   400 mg at 05/18/19 0834  . metoprolol tartrate (LOPRESSOR) tablet 50 mg  50 mg Oral BID Barton Dubois, MD   50 mg at  05/18/19 0835  . mupirocin ointment (BACTROBAN) 2 % 1 application  1 application Nasal BID Enzo Bi, MD   1 application at 41/66/06 1356  . ondansetron (ZOFRAN) injection 4 mg  4 mg Intravenous Q6H PRN Barton Dubois, MD      . pantoprazole (PROTONIX) EC tablet 40 mg  40 mg Oral QAC breakfast Barton Dubois, MD   40 mg at 05/18/19 0834  . sodium chloride flush (NS) 0.9 % injection 3 mL  3 mL Intravenous Q12H Barton Dubois, MD   3 mL at 05/18/19 0835  . sodium chloride flush (NS) 0.9 % injection 3 mL  3 mL Intravenous PRN Barton Dubois, MD         Discharge Medications: Please see discharge summary for a list  of discharge medications.  Relevant Imaging Results:  Relevant Lab Results:   Additional Information SSN [id number]  Debroh Sieloff, Chauncey Reading, RN

## 2019-05-19 LAB — BASIC METABOLIC PANEL
Anion gap: 8 (ref 5–15)
BUN: 30 mg/dL — ABNORMAL HIGH (ref 8–23)
CO2: 32 mmol/L (ref 22–32)
Calcium: 8.7 mg/dL — ABNORMAL LOW (ref 8.9–10.3)
Chloride: 101 mmol/L (ref 98–111)
Creatinine, Ser: 3.17 mg/dL — ABNORMAL HIGH (ref 0.61–1.24)
GFR calc Af Amer: 22 mL/min — ABNORMAL LOW (ref >60)
GFR calc non Af Amer: 19 mL/min — ABNORMAL LOW (ref >60)
Glucose, Bld: 112 mg/dL — ABNORMAL HIGH (ref 70–99)
Potassium: 4.1 mmol/L (ref 3.5–5.1)
Sodium: 141 mmol/L (ref 135–145)

## 2019-05-19 LAB — GLUCOSE, CAPILLARY
Glucose-Capillary: 92 mg/dL (ref 70–99)
Glucose-Capillary: 130 mg/dL — ABNORMAL HIGH (ref 70–99)
Glucose-Capillary: 128 mg/dL — ABNORMAL HIGH (ref 70–99)
Glucose-Capillary: 132 mg/dL — ABNORMAL HIGH (ref 70–99)

## 2019-05-19 LAB — CBC
HCT: 27.8 % — ABNORMAL LOW (ref 39.0–52.0)
Hemoglobin: 8.4 g/dL — ABNORMAL LOW (ref 13.0–17.0)
MCH: 28.5 pg (ref 26.0–34.0)
MCHC: 30.2 g/dL (ref 30.0–36.0)
MCV: 94.2 fL (ref 80.0–100.0)
Platelets: 373 10*3/uL (ref 150–400)
RBC: 2.95 MIL/uL — ABNORMAL LOW (ref 4.22–5.81)
RDW: 17.2 % — ABNORMAL HIGH (ref 11.5–15.5)
WBC: 9.6 10*3/uL (ref 4.0–10.5)
nRBC: 0 % (ref 0.0–0.2)

## 2019-05-19 LAB — MAGNESIUM: Magnesium: 1.9 mg/dL (ref 1.7–2.4)

## 2019-05-19 NOTE — Progress Notes (Addendum)
PROGRESS NOTE    Eric Scott  ZOX:[id number] DOB: 1950-09-17 DOA: 05/16/2019 PCP: [name], MD    Assessment & Plan:   Principal Problem:   Acute respiratory failure [facility]) Active Problems:   Type 2 diabetes with nephropathy ([place])   HTN (hypertension)   Acute on chronic diastolic CHF (congestive heart failure) ([name])   Chronic venous insufficiency   GERD (gastroesophageal reflux disease)   Obesity, Class III, BMI 40-49.9 (morbid obesity) ([name])   Atrial flutter, paroxysmal ([name])    Eric Scott is a 68 y.o. 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     # acute on chronic respiratory failure with hypoxia/hypercapnia 2/2 pulm edema # acute on chronic diastolic heart failure -Patient initial presentation demonstrating interstitial edema on chest x-ray, increase shortness of breath, crackles and elevated BNP. -There was also complaining of hypercapnia and ended requiring transient use of BiPAP. -Recent 2D echo done demonstrating diastolic heart failure with 60-65 ejection fraction and no wall motion and normalities.   PLAN: -continue IV lasix 40 mg BID --low-sodium diet, daily weights, strict I's and O's -As needed BiPAP will be used. -Continue beta-blocker. --Monitor Cr closely while diuresing   # CKD4 --Cr 3.3 on presentation.  Baseline difficult to determine. --Monitor Cr closely while diuresing   # transient atrial flutter, resolved --questionable paroxysmal, vs triggered by CHF exacerbation. -Excellent response to Cardizem IV.  Patient back to sinus rhythm. -continue beta-blocker at 50 mg twice a day  # Type 2 diabetes with nephropathy ([name]) -Continue sliding scale insulin  and Lantus -Modified carbohydrate diet has been ordered  # HTN (hypertension) -Stable overall -Continue home metop --IV lasix 40 BID  # Chronic venous insufficiency # chronic stasis dermatitis and open wounds on bilateral lower extremities --no signs of superimposed infection. -wound care services to assist with chronic wrapping therapy.  # GERD -continue PPI  # Obesity, Class III, BMI 40-49.9 (morbid obesity) ([name]) -Low calorie diet, portion control and increase physical activity has been discussed with patient.  # depression/anxiety -Continue Celexa -hold benzodiazepines for now due to initial hypercapneia.  DVT prophylaxis: Heparin Code Status: Full code Family Communication: not today Disposition Plan: Anticipate discharge back to skilled nursing facility once respiratory status and fluid overload condition is stabilized. Admission status: Inpatient, length of stay more than 2 midnight; stepdown bed.   Subjective and Interval History:  Pt reported breathing better, now on 3L O2.  Pt had no complaints today.  No fever, chest pain, abdominal pain, N/V/D, dysuria.   Objective: Vitals:   05/19/19 1635 05/19/19 1800 05/19/19 2000 05/19/19 2200  BP:  (!) 158/127 (!) 128/45 (!) 161/65  Pulse: 74 72 73 74  Resp: 17 16 15 18   Temp: 97.7 F (36.5 C)     TempSrc: Oral     SpO2: 90% 93% 90% 91%  Weight:      Height:        Intake/Output Summary (Last 24 hours) at 05/19/2019 2215 Last data filed at 05/19/2019 2200 Gross per 24 hour  Intake 960 ml  Output 3050 ml  Net -2090 ml   Filed Weights   05/17/19 1614 05/18/19 0500 05/19/19 0500  Weight: (!) 141.9 kg (!) 142.5 kg (!) 137.8 kg  Examination:   Constitutional: NAD, AAOx3 HEENT: conjunctivae and lids normal, EOMI CV: RRR no M,R,G. Distal pulses +2.  No cyanosis.   RESP: CTA B/L over anterior, normal respiratory effort  GI: +BS, NTND Extremities: BLE swelling with oozing SKIN: warm, dry and intact  Neuro: II - XII grossly intact.  Sensation intact Psych: better mood and affect today.   Data Reviewed: I have personally reviewed following labs and imaging studies  CBC: Recent Labs  Lab 05/17/19 0016 05/19/19 0529  WBC 16.6* 9.6  HGB 9.4* 8.4*  HCT 31.7* 27.8*  MCV 94.3 94.2  PLT 504* 416   Basic Metabolic Panel: Recent Labs  Lab 05/17/19 0016 05/18/19 0417 05/19/19 0529  NA 141 143 141  K 3.6 3.9 4.1  CL 103 101 101  CO2 26 31 32  GLUCOSE 291* 114* 112*  BUN 31* 31* 30*  CREATININE 3.30* 2.98* 3.17*  CALCIUM 8.1* 8.4* 8.7*  MG  --   --  1.9   GFR: Estimated Creatinine Clearance: 32.1 mL/min (A) (by C-G formula based on SCr of 3.17 mg/dL (H)). Liver Function Tests: Recent Labs  Lab 05/17/19 0016  AST 15  ALT 11  ALKPHOS 90  BILITOT 0.5  PROT 7.7  ALBUMIN 3.3*   No results for input(s): LIPASE, AMYLASE in the last 168 hours. No results for input(s): AMMONIA in the last 168 hours. Coagulation Profile: No results for input(s): INR, PROTIME in the last 168 hours. Cardiac Enzymes: No results for input(s): CKTOTAL, CKMB, CKMBINDEX, TROPONINI in the last 168 hours. BNP (last 3 results) No results for input(s): PROBNP in the last 8760 hours. HbA1C: No results for input(s): HGBA1C in the last 72 hours. CBG: Recent Labs  Lab 05/18/19 2138 05/19/19 0733 05/19/19 1110 05/19/19 1626 05/19/19 2104  GLUCAP 132* 92 130* 128* 132*   Lipid Profile: No results for input(s): CHOL, HDL, LDLCALC, TRIG, CHOLHDL, LDLDIRECT in the last 72 hours. Thyroid Function Tests: No results for input(s): TSH, T4TOTAL, FREET4, T3FREE, THYROIDAB in the last 72 hours. Anemia Panel: No results for input(s): VITAMINB12, FOLATE, FERRITIN, TIBC, IRON, RETICCTPCT in the last 72 hours. Sepsis Labs: Recent Labs  Lab 05/17/19 0016 05/17/19 0550  LATICACIDVEN 3.6* 2.3*    Recent Results (from the past 240 hour(s))  SARS Coronavirus 2 by RT PCR (hospital order, performed in [place] hospital lab)     Status: None   Collection Time: 05/17/19 12:09 AM  Result Value Ref Range Status   SARS Coronavirus 2 NEGATIVE NEGATIVE Final    Comment: (NOTE) SARS-CoV-2 target nucleic acids are NOT DETECTED. The SARS-CoV-2 RNA is generally detectable in upper and lower respiratory specimens during the acute phase of infection. The lowest concentration of SARS-CoV-2 viral copies this assay can detect is 250 copies / mL. A negative result does not preclude SARS-CoV-2 infection and should not be used as the sole basis for treatment or other patient management decisions.  A negative result may occur with improper specimen collection / handling, submission of specimen other than nasopharyngeal swab, presence of viral mutation(s) within the areas targeted by this assay, and inadequate number of viral copies (<250 copies / mL). A negative result must be combined with clinical observations, patient history, and epidemiological information. Fact Sheet for Patients:   StrictlyIdeas.no Fact Sheet for Healthcare Providers: BankingDealers.co.za This test is not yet approved or cleared  by the [place] FDA and has been authorized for detection and/or diagnosis of SARS-CoV-2 by FDA under an Emergency Use  Authorization (EUA).  This EUA will remain in effect (meaning this test can be used) for the duration of the COVID-19 declaration under Section 564(b)(1) of the Act, 21 U.S.C. section 360bbb-3(b)(1), unless the authorization is terminated or revoked sooner. Performed at [facility], [address], [place], [place] [address]   MRSA PCR Screening     Status: Abnormal   Collection Time: 05/17/19  4:24 PM   Specimen: Nasal Mucosa; Nasopharyngeal  Result Value Ref Range Status   MRSA by PCR POSITIVE (A) NEGATIVE Final    Comment:        The GeneXpert MRSA Assay (FDA approved for NASAL specimens only), is one component of a  comprehensive MRSA colonization surveillance program. It is not intended to diagnose MRSA infection nor to guide or monitor treatment for MRSA infections. RESULT CALLED TO, READ BACK BY AND VERIFIED WITH: WILSON A. T 0840A ON [id number] BY THOMPSON S. Performed at [facility], [address] [place], [place] [address]       Radiology Studies: [facility] 1 View  Result Date: 05/18/2019 CLINICAL DATA:  Shortness of breath. EXAM: PORTABLE CHEST 1 VIEW COMPARISON:  Radiograph yesterday. Additional prior exams. CT 07/20/2014 FINDINGS: Cardiomegaly is unchanged. Unchanged vascular congestion and pulmonary edema. No acute or confluent airspace disease. No pneumothorax or large pleural effusion. Stable osseous structures. IMPRESSION: No significant change from yesterday. Unchanged cardiomegaly, vascular congestion, and pulmonary edema. No new abnormality. Electronically Signed   By: Keith Rake M.D.   On: 05/18/2019 05:39     Scheduled Meds: . aspirin EC  81 mg Oral Daily  . Chlorhexidine Gluconate Cloth  6 each Topical Q0600  . DULoxetine  60 mg Oral Daily  . fluticasone furoate-vilanterol  2 puff Inhalation Daily  . furosemide  40 mg Intravenous BID  . heparin  5,000 Units Subcutaneous Q8H  . insulin aspart  0-15 Units Subcutaneous TID WC  . insulin aspart  0-5 Units Subcutaneous QHS  . insulin glargine  30 Units Subcutaneous QHS  . lidocaine  1 patch Transdermal BID  . magnesium oxide  400 mg Oral Daily  . metoprolol tartrate  50 mg Oral BID  . mupirocin ointment  1 application Nasal BID  . pantoprazole  40 mg Oral QAC breakfast  . sodium chloride flush  3 mL Intravenous Q12H   Continuous Infusions: . sodium chloride       LOS: 2 days     Enzo Bi, MD [facility] If 7PM-7AM, please contact night-coverage 05/19/2019, 10:15 PM

## 2019-05-19 NOTE — Progress Notes (Signed)
Pt requesting something to help him relax so he can sleep. PRN xanax 0.5mg  PO given.

## 2019-05-20 LAB — BASIC METABOLIC PANEL
Anion gap: 10 (ref 5–15)
Anion gap: 11 (ref 5–15)
BUN: 28 mg/dL — ABNORMAL HIGH (ref 8–23)
BUN: 28 mg/dL — ABNORMAL HIGH (ref 8–23)
CO2: 31 mmol/L (ref 22–32)
CO2: 30 mmol/L (ref 22–32)
Calcium: 9.1 mg/dL (ref 8.9–10.3)
Calcium: 9.1 mg/dL (ref 8.9–10.3)
Chloride: 99 mmol/L (ref 98–111)
Chloride: 100 mmol/L (ref 98–111)
Creatinine, Ser: 3.08 mg/dL — ABNORMAL HIGH (ref 0.61–1.24)
Creatinine, Ser: 3.15 mg/dL — ABNORMAL HIGH (ref 0.61–1.24)
GFR calc Af Amer: 23 mL/min — ABNORMAL LOW (ref >60)
GFR calc Af Amer: 22 mL/min — ABNORMAL LOW (ref >60)
GFR calc non Af Amer: 20 mL/min — ABNORMAL LOW (ref >60)
GFR calc non Af Amer: 19 mL/min — ABNORMAL LOW (ref >60)
Glucose, Bld: 97 mg/dL (ref 70–99)
Glucose, Bld: 151 mg/dL — ABNORMAL HIGH (ref 70–99)
Potassium: 4.1 mmol/L (ref 3.5–5.1)
Potassium: 4.6 mmol/L (ref 3.5–5.1)
Sodium: 140 mmol/L (ref 135–145)
Sodium: 141 mmol/L (ref 135–145)

## 2019-05-20 LAB — MAGNESIUM: Magnesium: 2.1 mg/dL (ref 1.7–2.4)

## 2019-05-20 LAB — CBC
HCT: 28.3 % — ABNORMAL LOW (ref 39.0–52.0)
Hemoglobin: 8.7 g/dL — ABNORMAL LOW (ref 13.0–17.0)
MCH: 28 pg (ref 26.0–34.0)
MCHC: 30.7 g/dL (ref 30.0–36.0)
MCV: 91 fL (ref 80.0–100.0)
Platelets: 398 10*3/uL (ref 150–400)
RBC: 3.11 MIL/uL — ABNORMAL LOW (ref 4.22–5.81)
RDW: 17.3 % — ABNORMAL HIGH (ref 11.5–15.5)
WBC: 11.5 10*3/uL — ABNORMAL HIGH (ref 4.0–10.5)
nRBC: 0.2 % (ref 0.0–0.2)

## 2019-05-20 LAB — GLUCOSE, CAPILLARY
Glucose-Capillary: 140 mg/dL — ABNORMAL HIGH (ref 70–99)
Glucose-Capillary: 141 mg/dL — ABNORMAL HIGH (ref 70–99)
Glucose-Capillary: 132 mg/dL — ABNORMAL HIGH (ref 70–99)
Glucose-Capillary: 132 mg/dL — ABNORMAL HIGH (ref 70–99)

## 2019-05-20 NOTE — Progress Notes (Addendum)
PROGRESS NOTE    Eric Scott  WNU:[id number] DOB: November 12, 1950 DOA: 05/16/2019 PCP: [name], MD    Assessment & Plan:   Principal Problem:   Acute respiratory failure [facility]) Active Problems:   Type 2 diabetes with nephropathy ([place])   HTN (hypertension)   Acute on chronic diastolic CHF (congestive heart failure) ([name])   Chronic venous insufficiency   GERD (gastroesophageal reflux disease)   Obesity, Class III, BMI 40-49.9 (morbid obesity) ([name])   Atrial flutter, paroxysmal ([name])    Eric Scott is a 68 y.o. 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     # acute on chronic respiratory failure with hypoxia/hypercapnia 2/2 pulm edema # acute on chronic diastolic heart failure -Patient initial presentation demonstrating interstitial edema on chest x-ray, increase shortness of breath, crackles and elevated BNP. -There was also complaining of hypercapnia and ended requiring transient use of BiPAP. -Recent 2D echo done demonstrating diastolic heart failure with 60-65 ejection fraction and no wall motion and normalities.   -Respiratory status improving overall with IV diuresis , at baseline patient uses oxygen at facility mostly at night PLAN: -continue IV lasix 40 mg BID --low-sodium diet, daily weights, strict I's and O's -As needed BiPAP will be used. -Continue beta-blocker. --Monitor Cr closely while diuresing    # CKD4 --Cr 3.3 on presentation.  Baseline difficult to determine. --Monitor Cr closely while diuresing   # transient atrial flutter, resolved --questionable paroxysmal, vs triggered by CHF exacerbation. -Excellent response to Cardizem IV.  Patient back to sinus  rhythm. -continue metoprolol 50 mg twice a day  # Type 2 diabetes with nephropathy ([name]) -Continue sliding scale insulin and Lantus -Excellent control with A1c below 6, avoid over aggressive diabetic control to avoid severe hypoglycemia  # HTN (hypertension) -Stable overall -Continue home metop --IV lasix 40 BID  # Chronic venous insufficiency # chronic stasis dermatitis and open wounds on bilateral lower extremities --no signs of superimposed infection. -wound care -consult appreciated foam dressing recommended  # GERD -continue PPI  # Obesity, Class III, BMI 40-49.9 (morbid obesity) ([name]) -Low calorie diet, portion control and increase physical activity has been discussed with patient.  # depression/anxiety -Continue Celexa -hold benzodiazepines for now due to initial hypercapneia.  DVT prophylaxis: Heparin Code Status: Full code Family Communication: not today Disposition Plan: Anticipate discharge back to skilled nursing facility in a.m. if continues to improve   Subjective and Interval History:   -Respiratory status improving, oxygen requirement improving, voiding well, fluid balance is negative, weight is trending down   Objective: Vitals:   05/20/19 1456 05/20/19 1500 05/20/19 1600 05/20/19 1726  BP: (!) 138/59     Pulse: 73 96    Resp: 18 19 17    Temp:    98.3 F (36.8 C)  TempSrc:    Oral  SpO2: 94%     Weight:      Height:        Intake/Output Summary (Last 24 hours) at 05/20/2019 1912 Last data filed at 05/20/2019 1852 Gross per 24 hour  Intake 240 ml  Output 4650 ml  Net -4410 ml   Filed Weights   05/18/19 0500 05/19/19 0500 05/20/19 0500  Weight: (!) 142.5 kg (!) 137.8 kg 132.1 kg  Examination:   Constitutional: NAD, AAOx3 HEENT: conjunctivae and lids normal, EOMI Nose- [place] 2 L/min CV: RRR no M,R,G. Distal pulses +2.  No cyanosis.   RESP: CTA B/L over anterior, normal respiratory effort  GI: +BS, NTND Extremities: Improving  lower extremity edema, chronic venous stasis noted SKIN: warm, dry and intact Neuro: II - XII grossly intact.  Sensation intact Psych: better mood and affect today.   Data Reviewed: I have personally reviewed following labs and imaging studies  CBC: Recent Labs  Lab 05/17/19 0016 05/19/19 0529 05/20/19 0323  WBC 16.6* 9.6 11.5*  HGB 9.4* 8.4* 8.7*  HCT 31.7* 27.8* 28.3*  MCV 94.3 94.2 91.0  PLT 504* 373 462   Basic Metabolic Panel: Recent Labs  Lab 05/17/19 0016 05/18/19 0417 05/19/19 0529 05/20/19 0323 05/20/19 0738  NA 141 143 141 140 141  K 3.6 3.9 4.1 4.1 4.6  CL 103 101 101 99 100  CO2 26 31 32 31 30  GLUCOSE 291* 114* 112* 97 151*  BUN 31* 31* 30* 28* 28*  CREATININE 3.30* 2.98* 3.17* 3.08* 3.15*  CALCIUM 8.1* 8.4* 8.7* 9.1 9.1  MG  --   --  1.9 2.1  --    GFR: Estimated Creatinine Clearance: 31.6 mL/min (A) (by C-G formula based on SCr of 3.15 mg/dL (H)). Liver Function Tests: Recent Labs  Lab 05/17/19 0016  AST 15  ALT 11  ALKPHOS 90  BILITOT 0.5  PROT 7.7  ALBUMIN 3.3*   No results for input(s): LIPASE, AMYLASE in the last 168 hours. No results for input(s): AMMONIA in the last 168 hours. Coagulation Profile: No results for input(s): INR, PROTIME in the last 168 hours. Cardiac Enzymes: No results for input(s): CKTOTAL, CKMB, CKMBINDEX, TROPONINI in the last 168 hours. BNP (last 3 results) No results for input(s): PROBNP in the last 8760 hours. HbA1C: No results for input(s): HGBA1C in the last 72 hours. CBG: Recent Labs  Lab 05/19/19 1626 05/19/19 2104 05/20/19 0756 05/20/19 1110 05/20/19 1719  GLUCAP 128* 132* 140* 141* 132*   Lipid Profile: No results for input(s): CHOL, HDL, LDLCALC, TRIG, CHOLHDL, LDLDIRECT in the last 72 hours. Thyroid Function Tests: No results for input(s): TSH, T4TOTAL, FREET4, T3FREE, THYROIDAB in the last 72 hours. Anemia Panel: No results for input(s): VITAMINB12, FOLATE, FERRITIN, TIBC, IRON, RETICCTPCT  in the last 72 hours. Sepsis Labs: Recent Labs  Lab 05/17/19 0016 05/17/19 0550  LATICACIDVEN 3.6* 2.3*    Recent Results (from the past 240 hour(s))  SARS Coronavirus 2 by RT PCR (hospital order, performed in [place] hospital lab)     Status: None   Collection Time: 05/17/19 12:09 AM  Result Value Ref Range Status   SARS Coronavirus 2 NEGATIVE NEGATIVE Final    Comment: (NOTE) SARS-CoV-2 target nucleic acids are NOT DETECTED. The SARS-CoV-2 RNA is generally detectable in upper and lower respiratory specimens during the acute phase of infection. The lowest concentration of SARS-CoV-2 viral copies this assay can detect is 250 copies / mL. A negative result does not preclude SARS-CoV-2 infection and should not be used as the sole basis for treatment or other patient management decisions.  A negative result may occur with improper specimen collection / handling, submission of specimen other than nasopharyngeal swab, presence of viral mutation(s) within the areas targeted by this assay, and inadequate number of viral copies (<250 copies / mL). A negative result must be combined with clinical observations, patient history, and epidemiological information. Fact Sheet for Patients:  StrictlyIdeas.no Fact Sheet for Healthcare Providers: BankingDealers.co.za This test is not yet approved or cleared  by the [place] FDA and has been authorized for detection and/or diagnosis of SARS-CoV-2 by FDA under an Emergency Use Authorization (EUA).  This EUA will remain in effect (meaning this test can be used) for the duration of the COVID-19 declaration under Section 564(b)(1) of the Act, 21 U.S.C. section 360bbb-3(b)(1), unless the authorization is terminated or revoked sooner. Performed at [facility], [address], [place], [place] [address]   MRSA PCR Screening     Status: Abnormal   Collection Time: 05/17/19  4:24 PM   Specimen:  Nasal Mucosa; Nasopharyngeal  Result Value Ref Range Status   MRSA by PCR POSITIVE (A) NEGATIVE Final    Comment:        The GeneXpert MRSA Assay (FDA approved for NASAL specimens only), is one component of a comprehensive MRSA colonization surveillance program. It is not intended to diagnose MRSA infection nor to guide or monitor treatment for MRSA infections. RESULT CALLED TO, READ BACK BY AND VERIFIED WITH: WILSON A. T 0840A ON [id number] BY THOMPSON S. Performed at [facility], [address], [place], [place] [address]       Radiology Studies: No results found.   Scheduled Meds: . aspirin EC  81 mg Oral Daily  . Chlorhexidine Gluconate Cloth  6 each Topical Q0600  . DULoxetine  60 mg Oral Daily  . fluticasone furoate-vilanterol  2 puff Inhalation Daily  . furosemide  40 mg Intravenous BID  . heparin  5,000 Units Subcutaneous Q8H  . insulin aspart  0-15 Units Subcutaneous TID WC  . insulin aspart  0-5 Units Subcutaneous QHS  . insulin glargine  30 Units Subcutaneous QHS  . lidocaine  1 patch Transdermal BID  . magnesium oxide  400 mg Oral Daily  . metoprolol tartrate  50 mg Oral BID  . mupirocin ointment  1 application Nasal BID  . pantoprazole  40 mg Oral QAC breakfast  . sodium chloride flush  3 mL Intravenous Q12H   Continuous Infusions: . sodium chloride       LOS: 3 days     Roxan Hockey, MD [facility] If 7PM-7AM, please contact night-coverage 05/20/2019, 7:12 PM

## 2019-05-21 DIAGNOSIS — U071 COVID-19: Secondary | ICD-10-CM | POA: Diagnosis not present

## 2019-05-21 DIAGNOSIS — J9601 Acute respiratory failure with hypoxia: Secondary | ICD-10-CM | POA: Diagnosis not present

## 2019-05-21 DIAGNOSIS — M6281 Muscle weakness (generalized): Secondary | ICD-10-CM | POA: Diagnosis not present

## 2019-05-21 LAB — BASIC METABOLIC PANEL
Anion gap: 12 (ref 5–15)
BUN: 29 mg/dL — ABNORMAL HIGH (ref 8–23)
CO2: 30 mmol/L (ref 22–32)
Calcium: 9.1 mg/dL (ref 8.9–10.3)
Chloride: 99 mmol/L (ref 98–111)
Creatinine, Ser: 3.31 mg/dL — ABNORMAL HIGH (ref 0.61–1.24)
GFR calc Af Amer: 21 mL/min — ABNORMAL LOW (ref >60)
GFR calc non Af Amer: 18 mL/min — ABNORMAL LOW (ref >60)
Glucose, Bld: 98 mg/dL (ref 70–99)
Potassium: 3.9 mmol/L (ref 3.5–5.1)
Sodium: 141 mmol/L (ref 135–145)

## 2019-05-21 LAB — GLUCOSE, CAPILLARY
Glucose-Capillary: 100 mg/dL — ABNORMAL HIGH (ref 70–99)
Glucose-Capillary: 153 mg/dL — ABNORMAL HIGH (ref 70–99)

## 2019-05-21 MED ORDER — ALPRAZOLAM 0.5 MG PO TABS: 0.5000 mg | ORAL_TABLET | Freq: Two times a day (BID) | ORAL | 0 refills | Status: DC | PRN

## 2019-05-21 MED ORDER — TORSEMIDE 20 MG PO TABS: 20.0000 mg | ORAL_TABLET | ORAL | 3 refills | Status: DC

## 2019-05-21 MED ORDER — DULOXETINE HCL 60 MG PO CPEP: 60.0000 mg | ORAL_CAPSULE | Freq: Every day | ORAL | 3 refills | Status: DC

## 2019-05-21 MED ORDER — METOPROLOL TARTRATE 50 MG PO TABS: 50.0000 mg | ORAL_TABLET | Freq: Two times a day (BID) | ORAL | 3 refills | Status: DC

## 2019-05-21 MED ORDER — FUROSEMIDE 10 MG/ML IJ SOLN
60.0000 mg | Freq: Once | INTRAMUSCULAR | Status: AC
  Administered 2019-05-21: 60 mg via INTRAVENOUS
  Filled 2019-05-21: qty 6

## 2019-05-21 MED ORDER — HYDROCODONE-ACETAMINOPHEN 5-325 MG PO TABS: 1.0000 | ORAL_TABLET | Freq: Three times a day (TID) | ORAL | 0 refills | Status: DC | PRN

## 2019-05-21 MED ORDER — AMLODIPINE BESYLATE 10 MG PO TABS: 10.0000 mg | ORAL_TABLET | Freq: Every day | ORAL | 2 refills | Status: DC

## 2019-05-21 MED ORDER — INSULIN GLARGINE 100 UNIT/ML ~~LOC~~ SOLN: 32.0000 [IU] | Freq: Every day | SUBCUTANEOUS | 1 refills | Status: DC

## 2019-05-21 MED ORDER — TORSEMIDE 20 MG PO TABS: 40.0000 mg | ORAL_TABLET | Freq: Two times a day (BID) | ORAL | 3 refills | Status: DC

## 2019-05-21 MED ORDER — PANTOPRAZOLE SODIUM 40 MG PO TBEC: 40.0000 mg | DELAYED_RELEASE_TABLET | Freq: Every day | ORAL | 0 refills | Status: AC

## 2019-05-21 MED ORDER — ATORVASTATIN CALCIUM 20 MG PO TABS: 20.0000 mg | ORAL_TABLET | Freq: Every day | ORAL | 2 refills | Status: DC

## 2019-05-21 NOTE — Discharge Instructions (Signed)
1)Avoid ibuprofen/Advil/Aleve/Motrin/Goody Powders/Naproxen/BC powders/Meloxicam/Diclofenac/Indomethacin and other Nonsteroidal anti-inflammatory medications as these will make you more likely to bleed and can cause stomach ulcers, can also cause Kidney problems.   2) repeat CBC and BMP blood test on Monday, 05/25/2019  3) follow-up with nephrologist Dr. Theador Hawthorne in a couple of weeks  4)Very low-salt diet advised  5)Weigh yourself daily, call if you gain more than 3 pounds in 1 day or more than 5 pounds in 1 week as your diuretic medications may need to be adjusted  6)Limit your Fluid  intake to no more than 60 ounces (1.8 Liters) per day

## 2019-05-21 NOTE — Discharge Summary (Signed)
Eric Scott, is a 68 y.o. male  DOB 07-07-1950  MRN [id number].  Admission date:  05/16/2019  Admitting Physician  Barton Dubois, MD  Discharge Date:  05/21/2019   Primary MD  [name], MD  Recommendations for primary care physician for things to follow:   1)Avoid ibuprofen/Advil/Aleve/Motrin/Goody Powders/Naproxen/BC powders/Meloxicam/Diclofenac/Indomethacin and other Nonsteroidal anti-inflammatory medications as these will make you more likely to bleed and can cause stomach ulcers, can also cause Kidney problems.   2) repeat CBC and BMP blood test on Monday, 05/25/2019  3) follow-up with nephrologist Dr. Theador Hawthorne in a couple of weeks  4)Very low-salt diet advised  5)Weigh yourself daily, call if you gain more than 3 pounds in 1 day or more than 5 pounds in 1 week as your diuretic medications may need to be adjusted  6)Limit your Fluid  intake to no more than 60 ounces (1.8 Liters) per day  Admission Diagnosis  Acute respiratory failure ([name]) [J96.00] SOB (shortness of breath) [R06.02] Atrial flutter with rapid ventricular response ([name]) [I48.92] Acute respiratory failure with hypoxia ([name]) [J96.01] Acute on chronic congestive heart failure, unspecified heart failure type ([place]) [I50.9]  Discharge Diagnosis  Acute respiratory failure ([name]) [J96.00] SOB (shortness of breath) [R06.02] Atrial flutter with rapid ventricular response ([name]) [I48.92] Acute respiratory failure with hypoxia ([name]) [J96.01] Acute on chronic congestive heart failure, unspecified heart failure type ([place]) [I50.9]    Principal Problem:   Acute respiratory failure ([name]) Active Problems:   Type 2 diabetes with nephropathy ([name])   HTN (hypertension)   Acute on chronic diastolic CHF (congestive heart failure) ([name])   Chronic venous insufficiency   GERD (gastroesophageal reflux disease)   Obesity, Class III, BMI  40-49.9 (morbid obesity) ([place])   Atrial flutter, paroxysmal ([name])     Past Medical History:  Diagnosis Date  . Anxiety   . Chronic pain    legs, back; MRI 05/2012 with mild thoracic degenerative changes no spinal stenosis   . 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 COPD (chronic obstructive pulmonary disease) ([place])   . Depression   . Diabetic peripheral neuropathy ([place])    "chronic" (05/27/2012)  . Diastolic CHF ([place])   . Hypercholesteremia   . Hypertension   . Peripheral edema   . PVD (peripheral vascular disease) ([place])   . Renal disorder   . Spinal stenosis    mild lumbar (MRI 05/2012)-L2-L3 to L4-L5 , mild lumbar foraminal stenosis   . Stroke ([place]) 05/2012   Subacute, lacunar infarcts within the left basal ganglia and posterior limp of the left internal capsule/thalamus; "RUE; both feet weak" (05/27/2012)  . Tremor   . Type II diabetes mellitus ([place])     Past Surgical History:  Procedure Laterality Date  . ANKLE SURGERY    . ESOPHAGOGASTRODUODENOSCOPY (EGD) WITH PROPOFOL N/A 12/21/2016   Procedure: ESOPHAGOGASTRODUODENOSCOPY (EGD) WITH PROPOFOL;  Surgeon: Daneil Dolin, MD;  [facility];  Service: Gastroenterology;  Laterality: N/A;  . ESOPHAGOGASTRODUODENOSCOPY (EGD) WITH PROPOFOL N/A 04/02/2017  Procedure: ESOPHAGOGASTRODUODENOSCOPY (EGD) WITH PROPOFOL;  Surgeon: Danie Binder, MD;  [facility]: [facility];  Service: Endoscopy;  Laterality: N/A;  1030   . HERNIA REPAIR  01/05/2004   "belly button" (05/27/2012)    HPI  from the history and physical done on the day of admission:    Chief Complaint: Respiratory distress and hypoxia  HPI: SAMARTH OGLE is a 68 y.o. male with a past medical history significant for depression/anxiety, type 2 diabetes mellitus with nephropathy and neuropathy; chronic diastolic heart failure, morbid obesity, COPD with chronic respiratory failure on 2 L of oxygen; who presented to the emergency department secondary  to worsening shortness of breath.  Apparently patient was in his usual state of health until approximately 30 to 40 minutes prior to his arrival to the ED where he was found in acute respiratory distress and reportedly hypoxic and he is a skilled nursing facility.  There was no fever, chest pain, nausea, vomiting, focal weakness, dysuria, hematuria, melena, hematochezia, productive cough or any other complaints.   Patient ended requiring to be placed on BiPAP secondary to increased respiratory distress and was also found acutely on atrial flutter with a heart rate in the 150s range.  BNP was elevated and chest x-ray confirmed fluid overload status with interstitial edema.  ABG demonstrated mild hypercapnia.  Patient received IV Cardizem and after couple hours on BiPAP was able to be transition to 6 L nasal cannula supplementation.  [facility] has been called admit patient for further evaluation and management.    Hospital Course:    Brief Summary:- -Khoury Siemon Longis a 68 y.o.caucasian malewith a past medical history significant for depression/anxiety, type 2 diabetes mellitus with nephropathy and neuropathy; chronic diastolic heart failure, morbid obesity, COPD with chronic respiratory failure on 2 L of oxygen; who presented to the emergency department from SNF secondary to worsening shortness of breath.   A/p  # acute on chronic respiratory failure with hypoxia/hypercapnia 2/2 pulm edema # acute on chronic diastolic heart failure -Patient initial presentation demonstrating interstitial edema on chest x-ray, increase shortness of breath, crackles and elevated BNP. -There was also complaining of hypercapnia and ended requiring transient use of BiPAP. -Recent 2D echo done demonstrating diastolic heart failure with 60-65 ejection fraction and no wall motion and normalities.   -Treated with IV lasix 40 mg BID -Overall improved -Weight is down to 291 pounds from 314 pounds on 05/18/2019 -Okay to  discharge on torsemide 40 mg twice daily with repeat BMP on 05/25/2019 and outpt nephrology follow-up   # CKD4 --Cr 3.3 on presentation.  -Creatinine stable just above 3 which is close to patient's recent baseline  # transient atrial flutter, resolved --questionable paroxysmal, vs triggered by CHF exacerbation. -Excellent response to Cardizem IV.  Patient back to sinus rhythm. -continue metoprolol at 50 mg twice a day -Given history of prior stroke anticoagulation discussed with patient -At this time patient is not committed to full anticoagulation  # Type 2 diabetes with nephropathy ([name]) -Continue sliding scale insulin and Lantus -Modified carbohydrate diet has been ordered  # HTN (hypertension) -BP control improving, okay to discharge on amlodipine 10 mg daily, torsemide 40 mg twice daily, metoprolol 50 mg twice daily -  # Chronic venous insufficiency # chronic stasis dermatitis and open wounds on bilateral lower extremities --no signs of superimposed infection. -wound care services to assist with chronic wrapping therapy.  # GERD -continue PPI  # Obesity, Class III, BMI 40-49.9 (morbid obesity) ([name]) -Low calorie  diet, portion control and increase physical activity has been discussed with patient.  # depression/anxiety -Continue Celexa -Resume PTA meds, decrease Xanax to 0.5 every 12 hours as needed . #History of prior stroke with left-sided hemiparesis----at baseline patient is wheelchair-bound---patient has transient atrial flutter, stroke risk discussed with patient-- At this time patient is not committed to full anticoagulation -Continue aspirin, will add Lipitor for secondary stroke prophylaxis given history of stroke -  Discharge Condition: stable  Follow UP--nephrologist Dr. Theador Hawthorne as advised  Diet and Activity recommendation:  As advised  Discharge Instructions    Discharge Instructions    Call MD for:  difficulty breathing, headache or visual  disturbances   Complete by: As directed    Call MD for:  persistant dizziness or light-headedness   Complete by: As directed    Call MD for:  persistant nausea and vomiting   Complete by: As directed    Call MD for:  severe uncontrolled pain   Complete by: As directed    Call MD for:  temperature >100.4   Complete by: As directed    Diet - low sodium heart healthy   Complete by: As directed    Diet Carb Modified   Complete by: As directed    Discharge instructions   Complete by: As directed    1)Avoid ibuprofen/Advil/Aleve/Motrin/Goody Powders/Naproxen/BC powders/Meloxicam/Diclofenac/Indomethacin and other Nonsteroidal anti-inflammatory medications as these will make you more likely to bleed and can cause stomach ulcers, can also cause Kidney problems.   2) repeat CBC and BMP blood test on Monday, 05/25/2019  3) follow-up with nephrologist Dr. Theador Hawthorne in a couple of weeks  4)Very low-salt diet advised  5)Weigh yourself daily, call if you gain more than 3 pounds in 1 day or more than 5 pounds in 1 week as your diuretic medications may need to be adjusted  6)Limit your Fluid  intake to no more than 60 ounces (1.8 Liters) per day   Increase activity slowly   Complete by: As directed       Discharge Medications     Allergies as of 05/21/2019      Reactions   Blueberry Flavor Other (See Comments)   Unknown   Cucumber Extract Other (See Comments)   "Fells like I'm having a heart attack"   Flexeril [cyclobenzaprine Hcl] Other (See Comments)   "whole body  Tremors" (05/27/2012)   Kiwi Extract Other (See Comments)   "feels like I'm having a heart attack"      Medication List    STOP taking these medications   furosemide 20 MG tablet Commonly known as: LASIX   furosemide 40 MG tablet Commonly known as: LASIX     TAKE these medications   acetaminophen 325 MG tablet Commonly known as: TYLENOL Take 650 mg by mouth every 6 (six) hours as needed.   albuterol (2.5 MG/3ML)  0.083% nebulizer solution Commonly known as: PROVENTIL Take 2.5 mg by nebulization every 3 (three) hours as needed for wheezing or shortness of breath.   allopurinol 100 MG tablet Commonly known as: ZYLOPRIM Take 1 tablet by mouth at bedtime.   ALPRAZolam 0.5 MG tablet Commonly known as: XANAX Take 1 tablet (0.5 mg total) by mouth every 12 (twelve) hours as needed for anxiety or sleep. What changed:   medication strength  how much to take  when to take this  reasons to take this   amLODipine 10 MG tablet Commonly known as: NORVASC Take 1 tablet (10 mg total) by mouth daily.  aspirin EC 81 MG tablet Take 81 mg by mouth daily.   atorvastatin 20 MG tablet Commonly known as: Lipitor Take 1 tablet (20 mg total) by mouth daily.   Breo Ellipta 100-25 MCG/INH Aepb Generic drug: fluticasone furoate-vilanterol Inhale 2 puffs into the lungs daily.   calcium carbonate 500 MG chewable tablet Commonly known as: TUMS - dosed in mg elemental calcium Chew 2 tablets by mouth 2 (two) times daily.   cholecalciferol 1000 units tablet Commonly known as: VITAMIN D Take 3,000 Units by mouth daily.   cyanocobalamin 1000 MCG tablet Take 1,000 mcg by mouth daily.   DULoxetine 60 MG capsule Commonly known as: CYMBALTA Take 1 capsule (60 mg total) by mouth daily. Start taking on: May 22, 2019 What changed: Another medication with the same name was removed. Continue taking this medication, and follow the directions you see here.   fish oil-omega-3 fatty acids 1000 MG capsule Take 1 g by mouth 2 (two) times daily.   FLUoxetine 10 MG tablet Commonly known as: PROZAC Take 10 mg by mouth daily.   FLUoxetine 20 MG capsule Commonly known as: PROZAC Take 20 mg by mouth daily.   gabapentin 400 MG capsule Commonly known as: NEURONTIN Take 400 mg by mouth every 8 (eight) hours.   HumaLOG KwikPen 100 UNIT/ML KwikPen Generic drug: insulin lispro Inject 2-14 Units into the skin 3  (three) times daily. Per sliding scale 150-200= 2 units 201-250= 4 units 251-300= 6 units 301-349= 8 units 350-400=10units 401-450=12units 451-500=14units ABOVE 501- CALL MD   HYDROcodone-acetaminophen 5-325 MG tablet Commonly known as: NORCO/VICODIN Take 1 tablet by mouth every 8 (eight) hours as needed for moderate pain. What changed:   when to take this  reasons to take this   insulin glargine 100 UNIT/ML injection Commonly known as: LANTUS Inject 0.32 mLs (32 Units total) into the skin at bedtime. What changed:   how much to take  Another medication with the same name was removed. Continue taking this medication, and follow the directions you see here.   lidocaine 5 % Commonly known as: LIDODERM Place 1 patch onto the skin 2 (two) times daily. Remove & Discard patch within 12 hours or as directed by MD. Apply to lower back twice daily.   magnesium oxide 400 MG tablet Commonly known as: MAG-OX Take 400 mg by mouth daily.   metoprolol tartrate 50 MG tablet Commonly known as: LOPRESSOR Take 1 tablet (50 mg total) by mouth 2 (two) times daily. What changed:   medication strength  how much to take   multivitamin tablet Take 1 tablet by mouth daily.   pantoprazole 40 MG tablet Commonly known as: PROTONIX Take 1 tablet (40 mg total) by mouth daily before breakfast.   torsemide 20 MG tablet Commonly known as: Demadex Take 2 tablets (40 mg total) by mouth 2 (two) times daily.       Major procedures and Radiology Reports - PLEASE review detailed and final reports for all details, in brief -   CT Head  Result Date: 05/17/2019 CLINICAL DATA:  Encephalopathy EXAM: CT HEAD WITHOUT CONTRAST TECHNIQUE: Contiguous axial images were obtained from the base of the skull through the vertex without intravenous contrast. COMPARISON:  12/10/2016 FINDINGS: Brain: There is no mass, hemorrhage or extra-axial collection. There is generalized atrophy without lobar predilection.  Hypodensity of the white matter is most commonly associated with chronic microvascular disease. Old small vessel infarct of the right centrum semiovale and left thalamus. Vascular: No abnormal hyperdensity  of the major intracranial arteries or dural venous sinuses. No intracranial atherosclerosis. Skull: The visualized skull base, calvarium and extracranial soft tissues are normal. Sinuses/Orbits: No fluid levels or advanced mucosal thickening of the visualized paranasal sinuses. No mastoid or middle ear effusion. The orbits are normal. IMPRESSION: Generalized atrophy and chronic microvascular ischemia without acute intracranial abnormality. Electronically Signed   By: Ulyses Jarred M.D.   On: 05/17/2019 05:50   DG Chest Port 1 View  Result Date: 05/18/2019 CLINICAL DATA:  Shortness of breath. EXAM: PORTABLE CHEST 1 VIEW COMPARISON:  Radiograph yesterday. Additional prior exams. CT 07/20/2014 FINDINGS: Cardiomegaly is unchanged. Unchanged vascular congestion and pulmonary edema. No acute or confluent airspace disease. No pneumothorax or large pleural effusion. Stable osseous structures. IMPRESSION: No significant change from yesterday. Unchanged cardiomegaly, vascular congestion, and pulmonary edema. No new abnormality. Electronically Signed   By: Keith Rake M.D.   On: 05/18/2019 05:39   XR Chest Single View  Result Date: 05/17/2019 CLINICAL DATA:  Sudden onset of shortness of breath EXAM: PORTABLE CHEST 1 VIEW COMPARISON:  04/21/2019 FINDINGS: Cardiac shadow remains enlarged. Increasing vascular congestion is noted with interstitial edema consistent with CHF. No focal infiltrate is noted. No effusion is noted. The previously seen nodular density is obscured by the vascular changes. IMPRESSION: Changes of CHF. Electronically Signed   By: Inez Catalina M.D.   On: 05/17/2019 00:36   ECHOCARDIOGRAM COMPLETE  Result Date: 05/08/2019   ECHOCARDIOGRAM REPORT   Patient Name:   NASHAUN HILLMER Voytko Date of  Exam: 05/08/2019 Medical Rec #:  [id number]      Height:       72.0 in Accession #:    [id number]     Weight:       308.0 lb Date of Birth:  1950-12-17      BSA:          2.56 m Patient Age:    11 years       BP:           160/81 mmHg Patient Gender: M              HR:           85 bpm. Exam Location:  [facility] Procedure: 2D Echo, Cardiac Doppler and Color Doppler Indications:    I50.32 (ICD-10-CM) - Chronic diastolic CHF (congestive heart                 failure)  History:        Patient has prior history of Echocardiogram examinations, most                 recent 12/12/2016. CHF, COPD; Risk Factors:Hypertension and                 Diabetes. CVA (cerebral infarction).  Sonographer:    Alvino Chapel RCS Referring Phys: [place]  1. Left ventricular ejection fraction, by visual estimation, is 60 to 65%. The left ventricle has normal function. There is severely increased left ventricular hypertrophy.  2. Left ventricular diastolic parameters are indeterminate.  3. Global right ventricle has normal systolic function.The right ventricular size is normal. No increase in right ventricular wall thickness.  4. Left atrial size was normal.  5. Right atrial size was normal.  6. The mitral valve is normal in structure. Trace mitral valve regurgitation. No evidence of mitral stenosis.  7. The tricuspid valve is normal in structure. Tricuspid valve regurgitation is not demonstrated.  8. The aortic valve is tricuspid. Aortic valve regurgitation is mild. No evidence of aortic valve sclerosis or stenosis.  9. The pulmonic valve was not well visualized. Pulmonic valve regurgitation is not visualized. 10. The inferior vena cava is normal in size with greater than 50% respiratory variability, suggesting right atrial pressure of 3 mmHg. FINDINGS  Left Ventricle: Left ventricular ejection fraction, by visual estimation, is 60 to 65%. The left ventricle has normal function. There is severely increased left  ventricular hypertrophy. Left ventricular diastolic parameters are indeterminate. Right Ventricle: The right ventricular size is normal. No increase in right ventricular wall thickness. Global RV systolic function is has normal systolic function. Left Atrium: Left atrial size was normal in size. Right Atrium: Right atrial size was normal in size Pericardium: There is no evidence of pericardial effusion. Mitral Valve: The mitral valve is normal in structure. No evidence of mitral valve stenosis by observation. Trace mitral valve regurgitation. Tricuspid Valve: The tricuspid valve is normal in structure. Tricuspid valve regurgitation is not demonstrated. Aortic Valve: The aortic valve is tricuspid. Aortic valve regurgitation is mild. Aortic regurgitation PHT measures 332 msec. The aortic valve is structurally normal, with no evidence of sclerosis or stenosis. Aortic valve mean gradient measures 6.5 mmHg.  Aortic valve peak gradient measures 12.6 mmHg. Aortic valve area, by VTI measures 2.75 cm. Pulmonic Valve: The pulmonic valve was not well visualized. Pulmonic valve regurgitation is not visualized. No evidence of pulmonic stenosis. Aorta: The aortic root is normal in size and structure. Pulmonary Artery: Indeterminate PASP, inadequate TR jet.  Venous: The inferior vena cava is normal in size with greater than 50% respiratory variability, suggesting right atrial pressure of 3 mmHg. IAS/Shunts: No atrial level shunt detected by color flow Doppler.  LEFT VENTRICLE PLAX 2D LVIDd:         4.75 cm       Diastology LVIDs:         2.87 cm       LV e' lateral:   12.00 cm/s LV PW:         1.60 cm       LV E/e' lateral: 8.8 LV IVS:        1.62 cm       LV e' medial:    7.51 cm/s LVOT diam:     2.00 cm       LV E/e' medial:  14.0 LV SV:         74 ml LV SV Index:   26.98 LVOT Area:     3.14 cm  LV Volumes (MOD) LV area d, A2C:    35.50 cm LV area d, A4C:    30.50 cm LV area s, A2C:    19.30 cm LV area s, A4C:    16.50 cm  LV major d, A2C:   8.60 cm LV major d, A4C:   7.95 cm LV major s, A2C:   6.87 cm LV major s, A4C:   6.45 cm LV vol d, MOD A2C: 123.0 ml LV vol d, MOD A4C: 95.7 ml LV vol s, MOD A2C: 45.1 ml LV vol s, MOD A4C: 37.4 ml LV SV MOD A2C:     77.9 ml LV SV MOD A4C:     95.7 ml LV SV MOD BP:      70.6 ml RIGHT VENTRICLE RV S prime:     15.00 cm/s TAPSE (M-mode): 2.9 cm LEFT ATRIUM  Index       RIGHT ATRIUM           Index LA diam:        4.50 cm  1.76 cm/m  RA Area:     17.90 cm LA Vol (A2C):   100.0 ml 39.07 ml/m RA Volume:   43.90 ml  17.15 ml/m LA Vol (A4C):   63.7 ml  24.89 ml/m LA Biplane Vol: 79.9 ml  31.21 ml/m  AORTIC VALVE AV Area (Vmax):    2.67 cm AV Area (Vmean):   2.48 cm AV Area (VTI):     2.75 cm AV Vmax:           177.14 cm/s AV Vmean:          119.530 cm/s AV VTI:            0.349 m AV Peak Grad:      12.6 mmHg AV Mean Grad:      6.5 mmHg LVOT Vmax:         150.50 cm/s LVOT Vmean:        94.250 cm/s LVOT VTI:          0.306 m LVOT/AV VTI ratio: 0.87 AI PHT:            332 msec  AORTA Ao Root diam: 3.70 cm MITRAL VALVE MV Area (PHT): 3.99 cm              SHUNTS MV PHT:        55.10 msec            Systemic VTI:  0.31 m MV Decel Time: 190 msec              Systemic Diam: 2.00 cm MV E velocity: 105.00 cm/s 103 cm/s MV A velocity: 99.80 cm/s  70.3 cm/s MV E/A ratio:  1.05        1.5  Carlyle Dolly MD Electronically signed by Carlyle Dolly MD Signature Date/Time: 05/08/2019/2:09:27 PM    Final     Micro Results    Recent Results (from the past 240 hour(s))  SARS Coronavirus 2 by RT PCR (hospital order, performed in [place] hospital lab)     Status: None   Collection Time: 05/17/19 12:09 AM  Result Value Ref Range Status   SARS Coronavirus 2 NEGATIVE NEGATIVE Final    Comment: (NOTE) SARS-CoV-2 target nucleic acids are NOT DETECTED. The SARS-CoV-2 RNA is generally detectable in upper and lower respiratory specimens during the acute phase of infection. The  lowest concentration of SARS-CoV-2 viral copies this assay can detect is 250 copies / mL. A negative result does not preclude SARS-CoV-2 infection and should not be used as the sole basis for treatment or other patient management decisions.  A negative result may occur with improper specimen collection / handling, submission of specimen other than nasopharyngeal swab, presence of viral mutation(s) within the areas targeted by this assay, and inadequate number of viral copies (<250 copies / mL). A negative result must be combined with clinical observations, patient history, and epidemiological information. Fact Sheet for Patients:   StrictlyIdeas.no Fact Sheet for Healthcare Providers: BankingDealers.co.za This test is not yet approved or cleared  by the [place] FDA and has been authorized for detection and/or diagnosis of SARS-CoV-2 by FDA under an Emergency Use Authorization (EUA).  This EUA will remain in effect (meaning this test can be used) for the duration of the COVID-19 declaration under Section 564(b)(1) of the Act,  21 U.S.C. section 360bbb-3(b)(1), unless the authorization is terminated or revoked sooner. Performed at [facility], [address] [place], [place] [address]   MRSA PCR Screening     Status: Abnormal   Collection Time: 05/17/19  4:24 PM   Specimen: Nasal Mucosa; Nasopharyngeal  Result Value Ref Range Status   MRSA by PCR POSITIVE (A) NEGATIVE Final    Comment:        The GeneXpert MRSA Assay (FDA approved for NASAL specimens only), is one component of a comprehensive MRSA colonization surveillance program. It is not intended to diagnose MRSA infection nor to guide or monitor treatment for MRSA infections. RESULT CALLED TO, READ BACK BY AND VERIFIED WITH: WILSON A. T [id number] ON [id number] BY THOMPSON S. Performed at [facility], [address], [place], [place] [address]        Today   [place] today has no new complaints -Breathing much better, eating and drinking well and voiding well          Patient has been seen and examined prior to discharge   Objective   Blood pressure (!) 152/77, pulse 74, temperature 98.1 F (36.7 C), temperature source Oral, resp. rate 20, height 6' (1.829 m), weight 132 kg, SpO2 93 %.   Intake/Output Summary (Last 24 hours) at 05/21/2019 1411 Last data filed at 05/20/2019 1852 Gross per 24 hour  Intake --  Output 1850 ml  Net -1850 ml    Exam Gen:- Awake Alert, no acute distress , obese HEENT:- [place].AT, No sclera icterus Nose-  2 L/min Neck-Supple Neck,No JVD,.  Lungs-improved air movement, no wheezing CV- S1, S2 normal, regular Abd-  +ve B.Sounds, Abd Soft, No tenderness,    Extremity/Skin:- + edema with chronic stasis dermatitis type changes,   good pulses Psych-affect is appropriate, oriented x3 Neuro-left-sided hemiparesis which is not new   Data Review   CBC w Diff:  Lab Results  Component Value Date   WBC 11.5 (H) 05/20/2019   HGB 8.7 (L) 05/20/2019   HCT 28.3 (L) 05/20/2019   PLT 398 05/20/2019   LYMPHOPCT 6 04/22/2019   MONOPCT 3 04/22/2019   EOSPCT 0 04/22/2019   BASOPCT 0 04/22/2019    CMP:  Lab Results  Component Value Date   NA 141 05/21/2019   K 3.9 05/21/2019   CL 99 05/21/2019   CO2 30 05/21/2019   BUN 29 (H) 05/21/2019   CREATININE 3.31 (H) 05/21/2019   PROT 7.7 05/17/2019   ALBUMIN 3.3 (L) 05/17/2019   BILITOT 0.5 05/17/2019   ALKPHOS 90 05/17/2019   AST 15 05/17/2019   ALT 11 05/17/2019  .   Total Discharge time is about 33 minutes  Roxan Hockey M.D on 05/21/2019 at 2:11 PM  Go to [email or web address] -  for contact info  [facility] - Office  [phone]

## 2019-05-21 NOTE — TOC Transition Note (Signed)
Transition of Care [facility]) - CM/SW Discharge Note   Patient Details  Name: ONEIL BEHNEY MRN: [id number] Date of Birth: 1950/12/17  Transition of [facility]) CM/SW Contact:  Zakia Sainato, Chauncey Reading, RN Phone Number: 05/21/2019, 2:41 PM   Clinical Narrative:   Patient discharging back to [facility]. EMS arranged. DC clinicals sent via HUB.     Final next level of care: [place] Barriers to Discharge: Barriers Resolved   Patient Goals and CMS Choice Patient states their goals for this hospitalization and ongoing recovery are:: return to [facility].gov Compare Post Acute Care list provided to:: Patient    Discharge Placement              Patient chooses bed at: Other - please specify in the comment section below: Patient to be transferred to facility by: [facility]   Patient and family notified of of transfer: 05/21/19  Discharge Plan and Services   Discharge Planning Services: CM Consult                      Social Determinants of Health ([place]) Interventions     Readmission Risk Interventions Readmission Risk Prevention Plan 05/21/2019 05/18/2019 04/22/2019  Transportation Screening Complete Complete Complete  PCP or Specialist Appt within 3-5 Days - Complete -  HRI or [place] - Complete -  Social Work Consult for [place] Planning/Counseling - Complete -  Palliative Care Screening - Not Applicable -  Medication Review Press photographer) - Complete Complete  PCP or Specialist appointment within 3-5 days of discharge - - Not Complete  PCP/Specialist Appt Not Complete comments - - SNF MD  [place] or [place] - - Not Complete  HRI or Home Care Consult Pt Refusal Comments - - from SNF  SW Recovery Care/Counseling Consult - - Complete  Palliative Care Screening - - Not [place] - - Complete  Some recent data might be hidden

## 2019-05-22 ENCOUNTER — Encounter (HOSPITAL_COMMUNITY): Payer: Self-pay | Admitting: Emergency Medicine

## 2019-05-22 ENCOUNTER — Emergency Department (HOSPITAL_COMMUNITY)
Admission: EM | Admit: 2019-05-22 | Discharge: 2019-05-23 | Disposition: A | Discharge status: Home / Self Care | Payer: Medicare HMO | Attending: Emergency Medicine | Admitting: Emergency Medicine

## 2019-05-22 ENCOUNTER — Other Ambulatory Visit: Payer: Self-pay

## 2019-05-22 ENCOUNTER — Emergency Department (HOSPITAL_COMMUNITY): Payer: Medicare HMO

## 2019-05-22 DIAGNOSIS — I129 Hypertensive chronic kidney disease with stage 1 through stage 4 chronic kidney disease, or unspecified chronic kidney disease: Secondary | ICD-10-CM | POA: Insufficient documentation

## 2019-05-22 DIAGNOSIS — R29898 Other symptoms and signs involving the musculoskeletal system: Secondary | ICD-10-CM

## 2019-05-22 DIAGNOSIS — I10 Essential (primary) hypertension: Secondary | ICD-10-CM | POA: Diagnosis present

## 2019-05-22 DIAGNOSIS — L97929 Non-pressure chronic ulcer of unspecified part of left lower leg with unspecified severity: Secondary | ICD-10-CM | POA: Diagnosis present

## 2019-05-22 DIAGNOSIS — J449 Chronic obstructive pulmonary disease, unspecified: Secondary | ICD-10-CM | POA: Diagnosis not present

## 2019-05-22 DIAGNOSIS — M6281 Muscle weakness (generalized): Secondary | ICD-10-CM | POA: Diagnosis not present

## 2019-05-22 DIAGNOSIS — I639 Cerebral infarction, unspecified: Secondary | ICD-10-CM | POA: Diagnosis present

## 2019-05-22 DIAGNOSIS — R0602 Shortness of breath: Secondary | ICD-10-CM | POA: Insufficient documentation

## 2019-05-22 DIAGNOSIS — I87333 Chronic venous hypertension (idiopathic) with ulcer and inflammation of bilateral lower extremity: Secondary | ICD-10-CM | POA: Diagnosis present

## 2019-05-22 DIAGNOSIS — E1122 Type 2 diabetes mellitus with diabetic chronic kidney disease: Secondary | ICD-10-CM | POA: Diagnosis present

## 2019-05-22 DIAGNOSIS — I5032 Chronic diastolic (congestive) heart failure: Secondary | ICD-10-CM | POA: Diagnosis present

## 2019-05-22 DIAGNOSIS — N179 Acute kidney failure, unspecified: Secondary | ICD-10-CM

## 2019-05-22 DIAGNOSIS — Z743 Need for continuous supervision: Secondary | ICD-10-CM | POA: Diagnosis not present

## 2019-05-22 DIAGNOSIS — R5381 Other malaise: Secondary | ICD-10-CM | POA: Diagnosis not present

## 2019-05-22 DIAGNOSIS — I739 Peripheral vascular disease, unspecified: Secondary | ICD-10-CM | POA: Diagnosis present

## 2019-05-22 DIAGNOSIS — N184 Chronic kidney disease, stage 4 (severe): Secondary | ICD-10-CM | POA: Insufficient documentation

## 2019-05-22 DIAGNOSIS — D72829 Elevated white blood cell count, unspecified: Secondary | ICD-10-CM | POA: Diagnosis present

## 2019-05-22 DIAGNOSIS — Z20828 Contact with and (suspected) exposure to other viral communicable diseases: Secondary | ICD-10-CM | POA: Diagnosis not present

## 2019-05-22 DIAGNOSIS — F17210 Nicotine dependence, cigarettes, uncomplicated: Secondary | ICD-10-CM | POA: Diagnosis not present

## 2019-05-22 DIAGNOSIS — Z794 Long term (current) use of insulin: Secondary | ICD-10-CM | POA: Insufficient documentation

## 2019-05-22 DIAGNOSIS — Z7401 Bed confinement status: Secondary | ICD-10-CM | POA: Diagnosis not present

## 2019-05-22 DIAGNOSIS — E1121 Type 2 diabetes mellitus with diabetic nephropathy: Secondary | ICD-10-CM | POA: Diagnosis present

## 2019-05-22 DIAGNOSIS — Z7982 Long term (current) use of aspirin: Secondary | ICD-10-CM | POA: Diagnosis not present

## 2019-05-22 DIAGNOSIS — J9611 Chronic respiratory failure with hypoxia: Secondary | ICD-10-CM | POA: Diagnosis present

## 2019-05-22 DIAGNOSIS — Z79899 Other long term (current) drug therapy: Secondary | ICD-10-CM | POA: Diagnosis not present

## 2019-05-22 DIAGNOSIS — R69 Illness, unspecified: Secondary | ICD-10-CM | POA: Diagnosis not present

## 2019-05-22 LAB — BASIC METABOLIC PANEL
Anion gap: 14 (ref 5–15)
BUN: 30 mg/dL — ABNORMAL HIGH (ref 8–23)
CO2: 27 mmol/L (ref 22–32)
Calcium: 8.8 mg/dL — ABNORMAL LOW (ref 8.9–10.3)
Chloride: 97 mmol/L — ABNORMAL LOW (ref 98–111)
Creatinine, Ser: 3.64 mg/dL — ABNORMAL HIGH (ref 0.61–1.24)
GFR calc Af Amer: 19 mL/min — ABNORMAL LOW (ref >60)
GFR calc non Af Amer: 16 mL/min — ABNORMAL LOW (ref >60)
Glucose, Bld: 218 mg/dL — ABNORMAL HIGH (ref 70–99)
Potassium: 4 mmol/L (ref 3.5–5.1)
Sodium: 138 mmol/L (ref 135–145)

## 2019-05-22 LAB — BLOOD GAS, VENOUS
Acid-Base Excess: 6.3 mmol/L — ABNORMAL HIGH (ref 0.0–2.0)
Bicarbonate: 29.7 mmol/L — ABNORMAL HIGH (ref 20.0–28.0)
Drawn by: 53361
FIO2: 32
O2 Saturation: 79.3 %
Patient temperature: 37.4
pCO2, Ven: 44.9 mmHg (ref 44.0–60.0)
pH, Ven: 7.446 — ABNORMAL HIGH (ref 7.250–7.430)
pO2, Ven: 47 mmHg — ABNORMAL HIGH (ref 32.0–45.0)

## 2019-05-22 LAB — CBC WITH DIFFERENTIAL/PLATELET
Abs Immature Granulocytes: 0.15 10*3/uL — ABNORMAL HIGH (ref 0.00–0.07)
Basophils Absolute: 0.1 10*3/uL (ref 0.0–0.1)
Basophils Relative: 0 %
Eosinophils Absolute: 0.3 10*3/uL (ref 0.0–0.5)
Eosinophils Relative: 2 %
HCT: 28.2 % — ABNORMAL LOW (ref 39.0–52.0)
Hemoglobin: 8.7 g/dL — ABNORMAL LOW (ref 13.0–17.0)
Immature Granulocytes: 1 %
Lymphocytes Relative: 9 %
Lymphs Abs: 1.4 10*3/uL (ref 0.7–4.0)
MCH: 28.2 pg (ref 26.0–34.0)
MCHC: 30.9 g/dL (ref 30.0–36.0)
MCV: 91.6 fL (ref 80.0–100.0)
Monocytes Absolute: 1.4 10*3/uL — ABNORMAL HIGH (ref 0.1–1.0)
Monocytes Relative: 9 %
Neutro Abs: 12.8 10*3/uL — ABNORMAL HIGH (ref 1.7–7.7)
Neutrophils Relative %: 79 %
Platelets: 420 10*3/uL — ABNORMAL HIGH (ref 150–400)
RBC: 3.08 MIL/uL — ABNORMAL LOW (ref 4.22–5.81)
RDW: 17.6 % — ABNORMAL HIGH (ref 11.5–15.5)
WBC: 16.1 10*3/uL — ABNORMAL HIGH (ref 4.0–10.5)
nRBC: 0 % (ref 0.0–0.2)

## 2019-05-22 LAB — RESPIRATORY PANEL BY RT PCR (FLU A&B, COVID)
Influenza A by PCR: NEGATIVE
Influenza B by PCR: NEGATIVE
SARS Coronavirus 2 by RT PCR: NEGATIVE

## 2019-05-22 LAB — TROPONIN I (HIGH SENSITIVITY)
Troponin I (High Sensitivity): 93 ng/L — ABNORMAL HIGH (ref <18)
Troponin I (High Sensitivity): 96 ng/L — ABNORMAL HIGH (ref <18)

## 2019-05-22 LAB — BRAIN NATRIURETIC PEPTIDE: B Natriuretic Peptide: 250 pg/mL — ABNORMAL HIGH (ref 0.0–100.0)

## 2019-05-22 MED ORDER — AMOXICILLIN-POT CLAVULANATE 875-125 MG PO TABS: 1.0000 | ORAL_TABLET | Freq: Two times a day (BID) | ORAL | 0 refills | Status: DC

## 2019-05-22 MED ORDER — TORSEMIDE 20 MG PO TABS: ORAL_TABLET | ORAL | 1 refills | Status: DC

## 2019-05-22 NOTE — ED Provider Notes (Signed)
[facility] EMERGENCY DEPARTMENT Provider Note   CSN: [id number] Arrival date & time: 05/22/19  0425     History Chief Complaint  Patient presents with  . Shortness of Breath    Eric Scott is a 68 y.o. male.  Patient presents to the emergency department for evaluation of difficulty breathing.  Patient sent to the ER by ambulance from skilled nursing facility.  Patient reports waking up feeling short of breath.  Nursing home staff report the patient had room air oxygen saturation of 85%.  EMS report that their initial saturations were 92% and he has increased to 96% on nasal cannula oxygen.  At arrival to the ER, patient reports that he feels much better.  He no longer feels short of breath and has no complaints.  He did not experience any heart palpitations or chest pain.  He has not had any fever.  Patient reports that he usually does use oxygen at night but did not use any last night.        Past Medical History:  Diagnosis Date  . Anxiety   . Chronic pain    legs, back; MRI 05/2012 with mild thoracic degenerative changes no spinal stenosis   . 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 COPD (chronic obstructive pulmonary disease) ([place])   . Depression   . Diabetic peripheral neuropathy ([place])    "chronic" (05/27/2012)  . Diastolic CHF ([place])   . Hypercholesteremia   . Hypertension   . Peripheral edema   . PVD (peripheral vascular disease) ([place])   . Renal disorder   . Spinal stenosis    mild lumbar (MRI 05/2012)-L2-L3 to L4-L5 , mild lumbar foraminal stenosis   . Stroke ([place]) 05/2012   Subacute, lacunar infarcts within the left basal ganglia and posterior limp of the left internal capsule/thalamus; "RUE; both feet weak" (05/27/2012)  . Tremor   . Type II diabetes mellitus [facility])     Patient Active Problem List   Diagnosis Date Noted  . Acute respiratory failure ([place]) 05/17/2019  . Obesity, Class III, BMI 40-49.9 (morbid obesity) ([place]) 05/17/2019  .  Atrial flutter, paroxysmal ([place]) 05/17/2019  . COPD with acute exacerbation ([place]) 04/20/2019  . GERD (gastroesophageal reflux disease) 08/08/2018  . PUD (peptic ulcer disease)   . Gastric erosions 02/13/2017  . Duodenal ulcer 02/13/2017  . Transaminitis   . UGI bleed   . Arm DVT (deep venous thromboembolism), acute, right ([place]) 12/18/2016  . Uremia 12/10/2016  . Acute renal failure ([place])   . Volume depletion 11/23/2016  . Hypotension   . Liver enzyme elevation   . 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 Arterial insufficiency of lower extremity ([place]) 11/15/2016  . Pressure injury of skin 11/09/2016  . Sepsis ([place]) 11/09/2016  . Osteomyelitis ([place]) 11/09/2016  . Wounds, multiple 11/08/2016  . Chronic venous insufficiency 10/12/2016  . Critical lower limb ischemia 10/12/2016  . Fatty liver 09/10/2016  . History of colonic polyps 09/10/2016  . Gallstone 09/10/2016  . Edema 03/04/2015  . AKI (acute kidney injury) ([place]) 03/04/2015  . Fall 03/04/2015  . Generalized weakness 03/04/2015  . Diabetic polyneuropathy associated with type 2 diabetes mellitus ([place])   . Anxiety   . PVD (peripheral vascular disease) ([place])   . Pneumonia 07/20/2014  . Weight gain 05/02/2014  . Coarse tremors 04/18/2014  . Anxiety state 04/18/2014  . Acute respiratory failure with hypoxia ([place]) 04/07/2014  .  Acute on chronic diastolic CHF (congestive heart failure) ([place]) 04/07/2014  . Solitary pulmonary nodule 04/07/2014  . COPD with exacerbation ([place]) 04/02/2014  . Hypoxia 04/02/2014  . CVA (cerebral infarction) 06/01/2012  . Chronic pain (back, legs) 05/30/2012  . Toe laceration, 4th toe 05/30/2012  . C. difficile diarrhea 05/30/2012  . Hypokalemia 05/30/2012  . Acute lacunar stroke ([place]) 05/27/2012  . Type 2 diabetes with nephropathy ([place]) 05/27/2012  . Smoker 05/27/2012  . HTN (hypertension) 05/27/2012    Past Surgical  History:  Procedure Laterality Date  . ANKLE SURGERY    . ESOPHAGOGASTRODUODENOSCOPY (EGD) WITH PROPOFOL N/A 12/21/2016   Procedure: ESOPHAGOGASTRODUODENOSCOPY (EGD) WITH PROPOFOL;  Surgeon: Daneil Dolin, MD;  Location[facility];  Service: Gastroenterology;  Laterality: N/A;  . ESOPHAGOGASTRODUODENOSCOPY (EGD) WITH PROPOFOL N/A 04/02/2017   Procedure: ESOPHAGOGASTRODUODENOSCOPY (EGD) WITH PROPOFOL;  Surgeon: Danie Binder, MD;  Location: [facility];  Service: Endoscopy;  Laterality: N/A;  1030   . HERNIA REPAIR  01/05/2004   "belly button" (05/27/2012)       Family History  Problem Relation Age of Onset  . Colon cancer Neg Hx     Social History   Tobacco Use  . Smoking status: Current Every Day Smoker    Packs/day: 0.50    Years: 45.00    Pack years: 22.50    Types: Cigarettes    Last attempt to quit: 12/28/2014    Years since quitting: 4.4  . Smokeless tobacco: Never Used  . Tobacco comment: 1/2 pk per day  Substance Use Topics  . Alcohol use: No    Comment: 05/27/2012 "drank gallons and gallons 20 yr ago or so; last drink  at least 10 yr ago"  . Drug use: No    Home Medications Prior to Admission medications   Medication Sig Start Date End Date Taking? Authorizing Provider  acetaminophen (TYLENOL) 325 MG tablet Take 650 mg by mouth every 6 (six) hours as needed.    [provider]  albuterol (PROVENTIL) (2.5 MG/3ML) 0.083% nebulizer solution Take 2.5 mg by nebulization every 3 (three) hours as needed for wheezing or shortness of breath.    [provider]  allopurinol (ZYLOPRIM) 100 MG tablet Take 1 tablet by mouth at bedtime.    [provider]  ALPRAZolam Duanne Moron) 0.5 MG tablet Take 1 tablet (0.5 mg total) by mouth every 12 (twelve) hours as needed for anxiety or sleep. 05/21/19   Roxan Hockey, MD  amLODipine (NORVASC) 10 MG tablet Take 1 tablet (10 mg total) by mouth daily. 05/21/19   Roxan Hockey, MD  aspirin EC 81 MG  tablet Take 81 mg by mouth daily.    [provider]  atorvastatin (LIPITOR) 20 MG tablet Take 1 tablet (20 mg total) by mouth daily. 05/21/19 05/20/20  Roxan Hockey, MD  BREO ELLIPTA 100-25 MCG/INH AEPB Inhale 2 puffs into the lungs daily.  09/27/16   [provider]  calcium carbonate (TUMS - DOSED IN MG ELEMENTAL CALCIUM) 500 MG chewable tablet Chew 2 tablets by mouth 2 (two) times daily.    [provider]  cholecalciferol (VITAMIN D) 1000 units tablet Take 3,000 Units by mouth daily.     [provider]  cyanocobalamin 1000 MCG tablet Take 1,000 mcg by mouth daily.    [provider]  DULoxetine (CYMBALTA) 60 MG capsule Take 1 capsule (60 mg total) by mouth daily. 05/22/19   Roxan Hockey, MD  fish oil-omega-3 fatty acids 1000 MG capsule  Take 1 g by mouth 2 (two) times daily.    [provider]  FLUoxetine (PROZAC) 10 MG tablet Take 10 mg by mouth daily.    [provider]  FLUoxetine (PROZAC) 20 MG capsule Take 20 mg by mouth daily. 05/12/19   [provider]  gabapentin (NEURONTIN) 400 MG capsule Take 400 mg by mouth every 8 (eight) hours.     [provider]  HUMALOG KWIKPEN 100 UNIT/ML KiwkPen Inject 2-14 Units into the skin 3 (three) times daily. Per sliding scale 150-200= 2 units 201-250= 4 units 251-300= 6 units 301-349= 8 units 350-400=10units 401-450=12units 451-500=14units ABOVE 501- CALL MD 10/07/16   [provider]  HYDROcodone-acetaminophen (NORCO/VICODIN) 5-325 MG tablet Take 1 tablet by mouth every 8 (eight) hours as needed for moderate pain. 05/21/19   Roxan Hockey, MD  insulin glargine (LANTUS) 100 UNIT/ML injection Inject 0.32 mLs (32 Units total) into the skin at bedtime. 05/21/19   Roxan Hockey, MD  lidocaine (LIDODERM) 5 % Place 1 patch onto the skin 2 (two) times daily. Remove & Discard patch within 12 hours or as directed by MD. Apply to lower back twice daily.     [provider]  magnesium oxide (MAG-OX) 400 MG tablet Take 400 mg by mouth daily.    [provider]  metoprolol tartrate (LOPRESSOR) 50 MG tablet Take 1 tablet (50 mg total) by mouth 2 (two) times daily. 05/21/19   Roxan Hockey, MD  Multiple Vitamin (MULTIVITAMIN) tablet Take 1 tablet by mouth daily.    [provider]  pantoprazole (PROTONIX) 40 MG tablet Take 1 tablet (40 mg total) by mouth daily before breakfast. 05/21/19 06/20/19  Roxan Hockey, MD  torsemide (DEMADEX) 20 MG tablet Take 2 tablets (40 mg total) by mouth 2 (two) times daily. 05/21/19   Roxan Hockey, MD    Allergies    Blueberry flavor, Cucumber extract, Flexeril [cyclobenzaprine hcl], and Kiwi extract  Review of Systems   Review of Systems  Respiratory: Positive for shortness of breath.   All other systems reviewed and are negative.   Physical Exam Updated Vital Signs BP (!) 157/76   Pulse 79   Temp 99.3 F (37.4 C) (Oral)   Resp 20   Ht 6' (1.829 m)   Wt 122 kg   SpO2 95%   BMI 36.48 kg/m   Physical Exam Vitals and nursing note reviewed.  Constitutional:      General: He is not in acute distress.    Appearance: Normal appearance. He is well-developed.  HENT:     Head: Normocephalic and atraumatic.     Right Ear: Hearing normal.     Left Ear: Hearing normal.     Nose: Nose normal.  Eyes:     Conjunctiva/sclera: Conjunctivae normal.     Pupils: Pupils are equal, round, and reactive to light.  Cardiovascular:     Rate and Rhythm: Regular rhythm.     Heart sounds: S1 normal and S2 normal. No murmur. No friction rub. No gallop.   Pulmonary:     Effort: Pulmonary effort is normal. No respiratory distress.     Breath sounds: Examination of the right-upper field reveals wheezing. Examination of the right-lower field reveals rales. Examination of the left-lower field reveals rales. Decreased breath sounds, wheezing and rales present.  Chest:     Chest wall: No  tenderness.  Abdominal:     General: Bowel sounds are normal.     Palpations: Abdomen is soft.  Tenderness: There is no abdominal tenderness. There is no guarding or rebound. Negative signs include Murphy's sign and McBurney's sign.     Hernia: No hernia is present.  Musculoskeletal:        General: Normal range of motion.     Cervical back: Normal range of motion and neck supple.  Skin:    General: Skin is warm and dry.     Findings: No rash.  Neurological:     Mental Status: He is alert and oriented to person, place, and time.     GCS: GCS eye subscore is 4. GCS verbal subscore is 5. GCS motor subscore is 6.     Cranial Nerves: No cranial nerve deficit.     Sensory: No sensory deficit.     Coordination: Coordination normal.  Psychiatric:        Speech: Speech normal.        Behavior: Behavior normal.        Thought Content: Thought content normal.     ED Results / Procedures / Treatments   Labs (all labs ordered are listed, but only abnormal results are displayed) Labs Reviewed  CBC WITH DIFFERENTIAL/PLATELET - Abnormal; Notable for the following components:      Result Value   WBC 16.1 (*)    RBC 3.08 (*)    Hemoglobin 8.7 (*)    HCT 28.2 (*)    RDW 17.6 (*)    Platelets 420 (*)    Neutro Abs 12.8 (*)    Monocytes Absolute 1.4 (*)    Abs Immature Granulocytes 0.15 (*)    All other components within normal limits  BASIC METABOLIC PANEL - Abnormal; Notable for the following components:   Chloride 97 (*)    Glucose, Bld 218 (*)    BUN 30 (*)    Creatinine, Ser 3.64 (*)    Calcium 8.8 (*)    GFR calc non Af Amer 16 (*)    GFR calc Af Amer 19 (*)    All other components within normal limits  BRAIN NATRIURETIC PEPTIDE - Abnormal; Notable for the following components:   B Natriuretic Peptide 250.0 (*)    All other components within normal limits  BLOOD GAS, VENOUS - Abnormal; Notable for the following components:   pH, Ven 7.446 (*)    pO2, Ven 47.0 (*)     Bicarbonate 29.7 (*)    Acid-Base Excess 6.3 (*)    All other components within normal limits  TROPONIN I (HIGH SENSITIVITY) - Abnormal; Notable for the following components:   Troponin I (High Sensitivity) 93 (*)    All other components within normal limits  TROPONIN I (HIGH SENSITIVITY)    EKG EKG Interpretation  Date/Time:  Friday May 22 2019 05:03:20 EST Ventricular Rate:  78 PR Interval:    QRS Duration: 109 QT Interval:  411 QTC Calculation: 469 R Axis:   -21 Text Interpretation: Sinus rhythm Borderline left axis deviation Abnormal R-wave progression, late transition Borderline repolarization abnormality No significant change since last tracing Confirmed by Orpah Greek [phone]) on 05/22/2019 5:07:00 AM   Radiology DG Chest Port 1 View  Result Date: 05/22/2019 CLINICAL DATA:  Shortness of breath. EXAM: PORTABLE CHEST 1 VIEW COMPARISON:  Radiograph 05/18/2019, additional priors FINDINGS: Stable cardiomegaly. Aortic atherosclerosis. Improved vascular congestion and pulmonary edema from prior. New streaky retrocardiac opacities. Stable right perihilar nodule dating back to 2016, considered benign. IMPRESSION: 1. Stable cardiomegaly. Resolved pulmonary edema with mild residual vascular congestion since exam  4 days ago. 2. New streaky retrocardiac opacities, favor atelectasis over pneumonia. 3.  Aortic Atherosclerosis (ICD10-I70.0). Electronically Signed   By: Keith Rake M.D.   On: 05/22/2019 05:00    Procedures Procedures (including critical care time)  Medications Ordered in ED Medications - No data to display  ED Course  I have reviewed the triage vital signs and the nursing notes.  Pertinent labs & imaging results that were available during my care of the patient were reviewed by me and considered in my medical decision making (see chart for details).    MDM Rules/Calculators/A&P                      Patient presents to the emergency department for  decreased oxygen saturation.  Patient complained of shortness of breath and was noted to have an oxygen saturation of 85% this morning by nursing home staff.  He is not clear if he was on oxygen at that time.  Patient reports he did not wear oxygen last night but usually does.  When EMS arrived the patient was on 2 L but his oxygen saturation was 92%.  He was increased to 3 L for transport and has done well.  At arrival to the ER he is without complaints.  His shortness of breath has completely resolved.  Patient is afebrile.  He was recently hospitalized for congestive heart failure exacerbation with resulting respiratory failure.  His chest x-ray is significantly improved with minimal signs of congestive heart failure.  He did have slight wheezing crackles present that arrival but this has now cleared as well.  Patient continues to state that he has no complaints.  His only finding is that his troponin is slightly elevated from where it was during hospitalization.  He is not experiencing any chest pain and there is no EKG change.  This is likely from his brief episode of hypoxia.  I recommended hospitalization to the patient but he does not want to be hospitalized.  I did discuss with him the possibility his findings represent some damage to his heart causing his shortness of breath but he reports that he is not having any chest pain and he feels well.  He does not have any signs of mental status alteration, has a capacity to decline hospitalization.  I did reason with him and state that we would like to obtain a second troponin.  If this is markedly elevated he might reconsider hospitalization.  If it is about the same and he wishes to be discharged, it would be reasonable to let him go back to the nursing home.  Will need to sign out the patient to oncoming ER physician to review second troponin. Final Clinical Impression(s) / ED Diagnoses Final diagnoses:  Shortness of breath    Rx / DC Orders ED  Discharge Orders    None       Kam Rahimi, Gwenyth Allegra, MD 05/22/19 [id number]

## 2019-05-22 NOTE — ED Provider Notes (Signed)
Patient CARE signed out follow-up troponin repeat and reassess.  Patient was admitted and discharged recently to the nursing home.  Patient has multiple medical conditions.  Patient deconditioned on exam, tachypnea, requiring 2 to 3 L nasal cannula.  Patient does have nasal cannula oxygenation at his nursing home.  Patient is troponin elevated however no increase in repeat.  Discussed the case with tried hospitalist who knows the patient well and he assessed him in the ER and feels he is at baseline similar to his discharge.  Recommended Augmentin, change in diuretic dosing and outpatient follow-up.  The increased kidney function likely from aggressive diuresis from recent inpatient stay. COVID test obtained.  Labs Reviewed  CBC WITH DIFFERENTIAL/PLATELET - Abnormal; Notable for the following components:      Result Value   WBC 16.1 (*)    RBC 3.08 (*)    Hemoglobin 8.7 (*)    HCT 28.2 (*)    RDW 17.6 (*)    Platelets 420 (*)    Neutro Abs 12.8 (*)    Monocytes Absolute 1.4 (*)    Abs Immature Granulocytes 0.15 (*)    All other components within normal limits  BASIC METABOLIC PANEL - Abnormal; Notable for the following components:   Chloride 97 (*)    Glucose, Bld 218 (*)    BUN 30 (*)    Creatinine, Ser 3.64 (*)    Calcium 8.8 (*)    GFR calc non Af Amer 16 (*)    GFR calc Af Amer 19 (*)    All other components within normal limits  BRAIN NATRIURETIC PEPTIDE - Abnormal; Notable for the following components:   B Natriuretic Peptide 250.0 (*)    All other components within normal limits  BLOOD GAS, VENOUS - Abnormal; Notable for the following components:   pH, Ven 7.446 (*)    pO2, Ven 47.0 (*)    Bicarbonate 29.7 (*)    Acid-Base Excess 6.3 (*)    All other components within normal limits  TROPONIN I (HIGH SENSITIVITY) - Abnormal; Notable for the following components:   Troponin I (High Sensitivity) 93 (*)    All other components within normal limits  TROPONIN I (HIGH SENSITIVITY)  - Abnormal; Notable for the following components:   Troponin I (High Sensitivity) 96 (*)    All other components within normal limits  RESPIRATORY PANEL BY RT PCR (FLU A&B, COVID)  URINALYSIS, ROUTINE W REFLEX MICROSCOPIC   Dyspnea Troponin elevated ARF Golda Acre, MD 05/22/19 1058

## 2019-05-22 NOTE — ED Notes (Signed)
ekg given to Dr. Betsey Holiday

## 2019-05-22 NOTE — Discharge Instructions (Addendum)
Change torsemide from 40 mg bid to 40 mg am and 20 mg evening. Use 2L [place] oxygen during the day and at night with goal of 92 to 94% oxygen saturation.  Take Augmentin antibiotic for 1 week. Return for fevers, breathing difficulty or new concerns.

## 2019-05-22 NOTE — Progress Notes (Signed)
  Discharge recommendations:-  1)Avoid ibuprofen/Advil/Aleve/Motrin/Goody Powders/Naproxen/BC powders/Meloxicam/Diclofenac/Indomethacin and other Nonsteroidal anti-inflammatory medications as these will make you more likely to bleed and can cause stomach ulcers, can also cause Kidney problems.   2) repeat CBC and BMP blood test on Monday, 05/25/2019-please forward results to patient nephrologist Dr. [place]  3) follow-up with nephrologist Dr. Theador Hawthorne in a couple of weeks  4)Very low-salt diet advised  5)Weigh yourself daily, call if you gain more than 3 pounds in 1 day or more than 5 pounds in 1 week as your diuretic medications may need to be adjusted  6)Limit your Fluid intake to no more than 60 ounces (1.8 Liters) per day  7) change Torsemide to 40 mg every morning and 20 mg every afternoon  8) bilateral leg rash/venous stasis dermatitis type changes and stasis ulcers--- Plan: Topical treatment   for bedside nurses to perform daily as follows: foam dressings to protect and promote healing  9) patient should be on  oxygen at 2 L continuously during the day via nasal cannula and up to 3 L when patient is asleep/nightly   10) Augmentin 875 mg 1 twice a day for 5 days, physician at [place] facility to reevaluate after 5 days to see if Augmentin is to be extended for additional 2 days to complete 7 days of treatment for possible pneumonia   Discussed with EDP Dr Rosalyn Gess

## 2019-05-22 NOTE — ED Triage Notes (Signed)
Patient arrived by EMS from [facility] for shortness of breath. Staff reported patient's O2 sats were 85 percent on 2 liters of oxygen. Patient is on 2 liters of oxygen at the facility. EMS placed patient on 3 liters and oxygen saturations were 95 percent. Patient was COVID negative on Dec 7th and MRSA positive at [facility].

## 2019-05-22 NOTE — Consult Note (Signed)
Patient Demographics:    Eric Scott, is a 68 y.o. 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 ([name])   Arterial insufficiency of lower extremity ([name])   Leukocytosis   Chronic heart failure with preserved ejection fraction (HFpEF) ([name])    1) chronic hypoxic respiratory failure due to underlying COPD --- history of tobacco abuse, patient has been on oxygen at 2 L nightly at [facility] facility for a while now, apparently did not use oxygen overnight and was found hypoxic this morning -He is hypoxia is correctable here in the ED with 2 L of oxygen keeping his O2 sats above 96% -Chest x-ray today actually much improved compared to chest x-ray from 4 days ago, it appears patient's pulmonary edema is resolved -Next concern about possible left-sided pneumonia with leukocytosis noted--- blood cultures requested, - --treat empirically with Augmentin 875 mg 1 twice a day for 5 days, physician at [facility] SNF facility to reevaluate after 5 days to see if Augmentin is to be extended for additional 2 days to complete 7 days of treatment for possible pneumonia -Repeat CBC on Monday, 05/25/2019 -COVID-19 test negative -Patient should use oxygen continuously-patient should be on  oxygen at 2 L continuously during the day via nasal cannula and up to 3 L when patient is asleep/nightly  2)AKI----acute kidney injury on CKD stage   IV-     creatinine today is 3.64,   baseline creatinine = around 3 (creatinine was 3.31 on 05/21/2019) renally adjust medications, avoid nephrotoxic agents/dehydration/hypotension  -Bicarb is 27 and anion gap is 14, potassium is 4.0 - change Torsemide to 40 mg every morning and 20 mg every afternoon -Repeat BMP on Monday, 05/25/2019 and send results to patient's nephrologist Dr. Theador Hawthorne  3)HFpEF-Echo with EF of 60 to 65% without regional wall motion normalities, BNP today is 250 down from 285 on 05/17/2019 -Chest x-ray today showed resolved pulmonary edema compared to chest x-ray from 4 days ago much improved -Torsemide adjusted as above #2 due to kidney concerns  4) bilateral leg rash/venous stasis dermatitis type changes and stasis ulcers--- Plan: Topical treatment   for bedside nurses to perform daily as follows: foam dressings to protect and promote healing  5)Generalized weakness-- At baseline patient is wheelchair-bound and has mobility related challenges due to prior stroke with left-sided hemiparesis and generalized weakness  --Other chronic comorbid conditions including DM2, HTN, recent episode of transient atrial flutter, obesity, depression/anxiety and GERD reviewed -Please see full discharge summary from 05/21/2019  -Discharge recommendations:-  1)Avoid  ibuprofen/Advil/Aleve/Motrin/Goody Powders/Naproxen/BC powders/Meloxicam/Diclofenac/Indomethacin and other Nonsteroidal anti-inflammatory medications as these will make you more likely to bleed and can cause stomach ulcers, can also cause Kidney problems.   2) repeat CBC and BMP blood test on Monday, 05/25/2019-please forward results to patient nephrologist Dr. [place]  3) follow-up with nephrologist Dr. Theador Hawthorne in a couple of weeks  4)Very low-salt diet advised  5)Weigh yourself daily, call if you gain more than 3 pounds in 1 day or more than 5 pounds in 1 week as your diuretic medications may need to be  adjusted  6)Limit your Fluid intake to no more than 60 ounces (1.8 Liters) per day  7) change Torsemide to 40 mg every morning and 20 mg every afternoon  8) bilateral leg rash/venous stasis dermatitis type changes and stasis ulcers--- Plan: Topical treatment   for bedside nurses to perform daily as follows: foam dressings to protect and promote healing  9) patient should be on  oxygen at 2 L continuously during the day via nasal cannula and up to 3 L when patient is asleep/nightly  10) Augmentin 875 mg 1 twice a day for 5 days, physician at [facility] SN[facility] facility to reevaluate after 5 days to see if Augmentin is to be extended for additional 2 days to complete 7 days of treatment for possible pneumonia   Discussed with EDP Dr Rosalyn Gess   With History of - Reviewed by me  Past Medical History:  Diagnosis Date  . Anxiety   . Chronic pain    legs, back; MRI 05/2012 with mild thoracic degenerative changes no spinal stenosis   . 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 COPD (chronic obstructive pulmonary disease) ([place])   . Depression   . Diabetic peripheral neuropathy ([place])    "chronic" (05/27/2012)  . Diastolic CHF ([place])   . Hypercholesteremia   . Hypertension   . Peripheral edema   . PVD (peripheral vascular disease) ([place])   . Renal disorder   . Spinal stenosis    mild lumbar (MRI 05/2012)-L2-L3 to L4-L5 , mild lumbar foraminal stenosis   . Stroke ([place]) 05/2012   Subacute, lacunar infarcts within the left basal ganglia and posterior limp of the left internal capsule/thalamus; "RUE; both feet weak" (05/27/2012)  . Tremor   . Type II diabetes mellitus ([place])       Past Surgical History:  Procedure Laterality Date  . ANKLE SURGERY    . ESOPHAGOGASTRODUODENOSCOPY (EGD) WITH PROPOFOL N/A 12/21/2016   Procedure: ESOPHAGOGASTRODUODENOSCOPY (EGD) WITH PROPOFOL;  Surgeon: Daneil Dolin, MD;  [facility];  Service: Gastroenterology;  Laterality: N/A;   . ESOPHAGOGASTRODUODENOSCOPY (EGD) WITH PROPOFOL N/A 04/02/2017   Procedure: ESOPHAGOGASTRODUODENOSCOPY (EGD) WITH PROPOFOL;  Surgeon: Danie Binder, MD;  Location: [facility];  Service: Endoscopy;  Laterality: N/A;  1030   . HERNIA REPAIR  01/05/2004   "belly button" (05/27/2012)      Chief Complaint  Patient presents with  . Shortness of Breath      HPI:    Eric Scott  is a 68 y.o. male  caucasian malewith a past medical history significant for depression/anxiety, type 2 diabetes mellitus with nephropathy and neuropathy; chronic diastolic heart failure,  obesity, COPD with chronic hypoxic respiratory failure on 2 L of oxygen PTA, CKD IV and HTN presents to ED from [facility] on 69/62/9528 with concerns about hypoxia and dyspnea --EMS records reviewed Case discussed with EDP, patient interviewed in the ED  -It appears  patient went to bed last night and did not use his oxygen -He was found with O2 sats of 85% on room air (he supposed to be on 2 L of oxygen)-  -Per ED triage notes and EMS records oxygen saturation improved to 95% when EMS placed patient on 3 L  -Patient is familiar to me from being in the hospital in the last couple of days was discharged on 05/21/2019 - -At baseline patient is wheelchair-bound and has mobility related challenges due to prior stroke with left-sided hemiparesis and generalized weakness   -In ED chest x-ray showed resolved pulmonary edema, chest x-ray is much improved compared to previous chest x-ray from 4 days ago, there is some question of left-sided atelectasis versus pneumonia on chest x-ray however -EKG sinus and nonacute -BNP 250 which is lower than 285 from recent admission -Leukocytosis with a white count of 16,000 noted however patient has no fevers, no productive cough, no dysuria -Blood cultures requested - -Troponins are flat at 93 and 96, patient denies chest pains -Patient tells me is feeling back to baseline- -room air O2  sats is actually around 90% at this time, on 2 L O2 sat is back above 96%  - patient has been on oxygen at 2 L nightly at [facility] facility for a while now, apparently did not use oxygen overnight and was found hypoxic this morning -His hypoxia is correctable here in the ED with 2 L of oxygen keeping his O2 sats above 96% -Chest x-ray today actually much improved compared to chest x-ray from 4 days ago, it appears patient's pulmonary edema is resolved -Next concern about possible left-sided pneumonia with leukocytosis noted--- blood cultures requested  --For additional information please see full discharge summary dated 05/21/2019   Review of systems:    In addition to the HPI above,   A full Review of  Systems was done, all other systems reviewed are negative except as noted above in HPI , .    Social History:  Reviewed by me    Social History   Tobacco Use  . Smoking status: Current Every Day Smoker    Packs/day: 0.50    Years: 45.00    Pack years: 22.50    Types: Cigarettes    Last attempt to quit: 12/28/2014    Years since quitting: 4.4  . Smokeless tobacco: Never Used  . Tobacco comment: 1/2 pk per day  Substance Use Topics  . Alcohol use: No    Comment: 05/27/2012 "drank gallons and gallons 20 yr ago or so; last drink  at least 10 yr ago"      Family History :  Reviewed by me    Family History  Problem Relation Age of Onset  . Colon cancer Neg Hx      Home Medications:   Prior to Admission medications   Medication Sig Start Date End Date Taking? Authorizing Provider  acetaminophen (TYLENOL) 325 MG tablet Take 650 mg by mouth every 6 (six) hours as needed.    [provider]  albuterol (PROVENTIL) (2.5 MG/3ML) 0.083% nebulizer solution Take 2.5 mg by nebulization every 3 (three) hours as needed for wheezing or shortness of breath.    [provider]  allopurinol (ZYLOPRIM) 100 MG tablet Take 1 tablet by mouth at bedtime.    [provider]  ALPRAZolam Duanne Moron) 0.5 MG tablet Take 1 tablet (0.5 mg total) by mouth every 12 (twelve) hours as needed for anxiety or sleep. 05/21/19   Manar Smalling,  Danissa Rundle, MD  amLODipine (NORVASC) 10 MG tablet Take 1 tablet (10 mg total) by mouth daily. 05/21/19   Roxan Hockey, MD  aspirin EC 81 MG tablet Take 81 mg by mouth daily.    [provider]  atorvastatin (LIPITOR) 20 MG tablet Take 1 tablet (20 mg total) by mouth daily. 05/21/19 05/20/20  Roxan Hockey, MD  BREO ELLIPTA 100-25 MCG/INH AEPB Inhale 2 puffs into the lungs daily.  09/27/16   [provider]  calcium carbonate (TUMS - DOSED IN MG ELEMENTAL CALCIUM) 500 MG chewable tablet Chew 2 tablets by mouth 2 (two) times daily.    [provider]  cholecalciferol (VITAMIN D) 1000 units tablet Take 3,000 Units by mouth daily.     [provider]  cyanocobalamin 1000 MCG tablet Take 1,000 mcg by mouth daily.    [provider]  DULoxetine (CYMBALTA) 60 MG capsule Take 1 capsule (60 mg total) by mouth daily. 05/22/19   Roxan Hockey, MD  fish oil-omega-3 fatty acids 1000 MG capsule Take 1 g by mouth 2 (two) times daily.    [provider]  FLUoxetine (PROZAC) 10 MG tablet Take 10 mg by mouth daily.    [provider]  FLUoxetine (PROZAC) 20 MG capsule Take 20 mg by mouth daily. 05/12/19   [provider]  gabapentin (NEURONTIN) 400 MG capsule Take 400 mg by mouth every 8 (eight) hours.     [provider]  HUMALOG KWIKPEN 100 UNIT/ML KiwkPen Inject 2-14 Units into the skin 3 (three) times daily. Per sliding scale 150-200= 2 units 201-250= 4 units 251-300= 6 units 301-349= 8 units 350-400=10units 401-450=12units 451-500=14units ABOVE 501- CALL MD 10/07/16   [provider]  HYDROcodone-acetaminophen (NORCO/VICODIN) 5-325 MG tablet Take 1 tablet by mouth every 8 (eight) hours as needed for moderate pain. 05/21/19   Roxan Hockey, MD  insulin  glargine (LANTUS) 100 UNIT/ML injection Inject 0.32 mLs (32 Units total) into the skin at bedtime. 05/21/19   Roxan Hockey, MD  lidocaine (LIDODERM) 5 % Place 1 patch onto the skin 2 (two) times daily. Remove & Discard patch within 12 hours or as directed by MD. Apply to lower back twice daily.    [provider]  magnesium oxide (MAG-OX) 400 MG tablet Take 400 mg by mouth daily.    [provider]  metoprolol tartrate (LOPRESSOR) 50 MG tablet Take 1 tablet (50 mg total) by mouth 2 (two) times daily. 05/21/19   Roxan Hockey, MD  Multiple Vitamin (MULTIVITAMIN) tablet Take 1 tablet by mouth daily.    [provider]  pantoprazole (PROTONIX) 40 MG tablet Take 1 tablet (40 mg total) by mouth daily before breakfast. 05/21/19 06/20/19  Roxan Hockey, MD  torsemide (DEMADEX) 20 MG tablet Take 2 tablets (40 mg total) by mouth 2 (two) times daily. 05/21/19   Roxan Hockey, MD     Allergies:     Allergies  Allergen Reactions  . Blueberry Flavor Other (See Comments)    Unknown  . Cucumber Extract Other (See Comments)    "Fells like I'm having a heart attack"  . Flexeril [Cyclobenzaprine Hcl] Other (See Comments)    "whole body  Tremors" (05/27/2012)  . Kiwi Extract Other (See Comments)    "feels like I'm having a heart attack"     Physical Exam:   Vitals  Blood pressure (!) 166/73, pulse 85, temperature 99.3 F (37.4 C), temperature source Oral, resp. rate (!) 22, height 6' (1.829 m), weight 122  kg, SpO2 95 %.  Physical Examination: General appearance - alert, chronically ill appearing, and in no distress, speaking in sentences Mental status - alert, oriented to person, place, and time, Eyes - sclera anicteric Nose- [place] 2 L/min Neck - supple, no JVD elevation , Chest -slightly diminished in left base, no rhonchi or wheezes Heart - S1 and S2 normal, regular  Abdomen - soft, nontender, nondistended, , increased truncal adiposity  Neurological  -left-sided weakness/hemiparesis from prior stroke which is unchanged from baseline Extremities - trace pedal edema noted, intact peripheral pulses  Skin - warm, dry-- + edema with chronic stasis dermatitis type changes, no evidence of superimposed cellulitis type findings  good pulses     Data Review:    CBC Recent Labs  Lab 05/17/19 0016 05/19/19 0529 05/20/19 0323 05/22/19 0439  WBC 16.6* 9.6 11.5* 16.1*  HGB 9.4* 8.4* 8.7* 8.7*  HCT 31.7* 27.8* 28.3* 28.2*  PLT 504* 373 398 420*  MCV 94.3 94.2 91.0 91.6  MCH 28.0 28.5 28.0 28.2  MCHC 29.7* 30.2 30.7 30.9  RDW 17.4* 17.2* 17.3* 17.6*  LYMPHSABS  --   --   --  1.4  MONOABS  --   --   --  1.4*  EOSABS  --   --   --  0.3  BASOSABS  --   --   --  0.1   ------------------------------------------------------------------------------------------------------------------  Chemistries  Recent Labs  Lab 05/17/19 0016 05/19/19 0529 05/20/19 0323 05/20/19 0738 05/21/19 0448 05/22/19 0439  NA 141 141 140 141 141 138  K 3.6 4.1 4.1 4.6 3.9 4.0  CL 103 101 99 100 99 97*  CO2 26 32 31 30 30 27   GLUCOSE 291* 112* 97 151* 98 218*  BUN 31* 30* 28* 28* 29* 30*  CREATININE 3.30* 3.17* 3.08* 3.15* 3.31* 3.64*  CALCIUM 8.1* 8.7* 9.1 9.1 9.1 8.8*  MG  --  1.9 2.1  --   --   --   AST 15  --   --   --   --   --   ALT 11  --   --   --   --   --   ALKPHOS 90  --   --   --   --   --   BILITOT 0.5  --   --   --   --   --    ------------------------------------------------------------------------------------------------------------------ estimated creatinine clearance is 26.2 mL/min (A) (by C-G formula based on SCr of 3.64 mg/dL (H)). ------------------------------------------------------------------------------------------------------------------ No results for input(s): TSH, T4TOTAL, T3FREE, THYROIDAB in the last 72 hours.  Invalid input(s): FREET3   Coagulation profile No results for input(s): INR, PROTIME in the last 168  hours. ------------------------------------------------------------------------------------------------------------------- No results for input(s): DDIMER in the last 72 hours. -------------------------------------------------------------------------------------------------------------------  Cardiac Enzymes No results for input(s): CKMB, TROPONINI, MYOGLOBIN in the last 168 hours.  Invalid input(s): CK ------------------------------------------------------------------------------------------------------------------    Component Value Date/Time   BNP 250.0 (H) 05/22/2019 0439     ---------------------------------------------------------------------------------------------------------------  Urinalysis    Component Value Date/Time   COLORURINE YELLOW 12/10/2016 1041   APPEARANCEUR HAZY (A) 12/10/2016 1041   LABSPEC 1.016 12/10/2016 1041   PHURINE 5.0 12/10/2016 1041   GLUCOSEU NEGATIVE 12/10/2016 1041   HGBUR LARGE (A) 12/10/2016 1041   BILIRUBINUR NEGATIVE 12/10/2016 1041   KETONESUR 5 (A) 12/10/2016 1041   PROTEINUR 100 (A) 12/10/2016 1041   UROBILINOGEN 0.2 03/04/2015 1835   NITRITE NEGATIVE 12/10/2016 1041   LEUKOCYTESUR TRACE (A) 12/10/2016 1041    ----------------------------------------------------------------------------------------------------------------  Imaging Results:    DG Chest Port 1 View  Result Date: 05/22/2019 CLINICAL DATA:  Shortness of breath. EXAM: PORTABLE CHEST 1 VIEW COMPARISON:  Radiograph 05/18/2019, additional priors FINDINGS: Stable cardiomegaly. Aortic atherosclerosis. Improved vascular congestion and pulmonary edema from prior. New streaky retrocardiac opacities. Stable right perihilar nodule dating back to 2016, considered benign. IMPRESSION: 1. Stable cardiomegaly. Resolved pulmonary edema with mild residual vascular congestion since exam 4 days ago. 2. New streaky retrocardiac opacities, favor atelectasis over pneumonia. 3.  Aortic  Atherosclerosis (ICD10-I70.0). Electronically Signed   By: Keith Rake M.D.   On: 05/22/2019 05:00    Radiological Exams on Admission: DG Chest Port 1 View  Result Date: 05/22/2019 CLINICAL DATA:  Shortness of breath. EXAM: PORTABLE CHEST 1 VIEW COMPARISON:  Radiograph 05/18/2019, additional priors FINDINGS: Stable cardiomegaly. Aortic atherosclerosis. Improved vascular congestion and pulmonary edema from prior. New streaky retrocardiac opacities. Stable right perihilar nodule dating back to 2016, considered benign. IMPRESSION: 1. Stable cardiomegaly. Resolved pulmonary edema with mild residual vascular congestion since exam 4 days ago. 2. New streaky retrocardiac opacities, favor atelectasis over pneumonia. 3.  Aortic Atherosclerosis (ICD10-I70.0). Electronically Signed   By: Keith Rake M.D.   On: 05/22/2019 05:00   Family Communication: Admission, patients condition and plan of care including tests being ordered have been discussed with the patient and EDP who indicate understanding and agree with the plan   Code Status - Full Code  Likely DC to  To SNF   Condition   stable  Roxan Hockey M.D on 05/22/2019 at 10:42 AM Go to [email or web address] -  for contact info  [facility] - Office  [phone]

## 2019-05-23 DIAGNOSIS — N179 Acute kidney failure, unspecified: Secondary | ICD-10-CM | POA: Diagnosis not present

## 2019-05-23 DIAGNOSIS — R319 Hematuria, unspecified: Secondary | ICD-10-CM | POA: Diagnosis not present

## 2019-05-24 DIAGNOSIS — J9601 Acute respiratory failure with hypoxia: Secondary | ICD-10-CM | POA: Diagnosis not present

## 2019-05-24 DIAGNOSIS — M6281 Muscle weakness (generalized): Secondary | ICD-10-CM | POA: Diagnosis not present

## 2019-05-25 DIAGNOSIS — J441 Chronic obstructive pulmonary disease with (acute) exacerbation: Secondary | ICD-10-CM | POA: Diagnosis not present

## 2019-05-25 DIAGNOSIS — D631 Anemia in chronic kidney disease: Secondary | ICD-10-CM | POA: Diagnosis not present

## 2019-05-25 DIAGNOSIS — E119 Type 2 diabetes mellitus without complications: Secondary | ICD-10-CM | POA: Diagnosis not present

## 2019-05-25 DIAGNOSIS — Z79899 Other long term (current) drug therapy: Secondary | ICD-10-CM | POA: Diagnosis not present

## 2019-05-25 DIAGNOSIS — N184 Chronic kidney disease, stage 4 (severe): Secondary | ICD-10-CM | POA: Diagnosis not present

## 2019-05-25 DIAGNOSIS — E785 Hyperlipidemia, unspecified: Secondary | ICD-10-CM | POA: Diagnosis not present

## 2019-05-25 DIAGNOSIS — I10 Essential (primary) hypertension: Secondary | ICD-10-CM | POA: Diagnosis not present

## 2019-05-25 DIAGNOSIS — N1832 Chronic kidney disease, stage 3b: Secondary | ICD-10-CM | POA: Diagnosis not present

## 2019-05-25 DIAGNOSIS — I739 Peripheral vascular disease, unspecified: Secondary | ICD-10-CM | POA: Diagnosis not present

## 2019-05-25 DIAGNOSIS — D649 Anemia, unspecified: Secondary | ICD-10-CM | POA: Diagnosis not present

## 2019-05-25 DIAGNOSIS — I5032 Chronic diastolic (congestive) heart failure: Secondary | ICD-10-CM | POA: Diagnosis not present

## 2019-05-25 DIAGNOSIS — K2961 Other gastritis with bleeding: Secondary | ICD-10-CM | POA: Diagnosis not present

## 2019-05-25 DIAGNOSIS — R69 Illness, unspecified: Secondary | ICD-10-CM | POA: Diagnosis not present

## 2019-05-25 DIAGNOSIS — K264 Chronic or unspecified duodenal ulcer with hemorrhage: Secondary | ICD-10-CM | POA: Diagnosis not present

## 2019-05-25 DIAGNOSIS — I5033 Acute on chronic diastolic (congestive) heart failure: Secondary | ICD-10-CM | POA: Diagnosis not present

## 2019-05-25 DIAGNOSIS — I517 Cardiomegaly: Secondary | ICD-10-CM | POA: Diagnosis not present

## 2019-05-25 DIAGNOSIS — I130 Hypertensive heart and chronic kidney disease with heart failure and stage 1 through stage 4 chronic kidney disease, or unspecified chronic kidney disease: Secondary | ICD-10-CM | POA: Diagnosis not present

## 2019-05-25 DIAGNOSIS — J9601 Acute respiratory failure with hypoxia: Secondary | ICD-10-CM | POA: Diagnosis not present

## 2019-05-25 DIAGNOSIS — N179 Acute kidney failure, unspecified: Secondary | ICD-10-CM | POA: Diagnosis not present

## 2019-05-25 DIAGNOSIS — J189 Pneumonia, unspecified organism: Secondary | ICD-10-CM | POA: Diagnosis not present

## 2019-05-25 DIAGNOSIS — D72829 Elevated white blood cell count, unspecified: Secondary | ICD-10-CM | POA: Diagnosis not present

## 2019-05-25 DIAGNOSIS — N189 Chronic kidney disease, unspecified: Secondary | ICD-10-CM | POA: Diagnosis not present

## 2019-05-25 DIAGNOSIS — I129 Hypertensive chronic kidney disease with stage 1 through stage 4 chronic kidney disease, or unspecified chronic kidney disease: Secondary | ICD-10-CM | POA: Diagnosis not present

## 2019-05-25 DIAGNOSIS — R5383 Other fatigue: Secondary | ICD-10-CM | POA: Diagnosis not present

## 2019-05-25 DIAGNOSIS — Z20828 Contact with and (suspected) exposure to other viral communicable diseases: Secondary | ICD-10-CM | POA: Diagnosis not present

## 2019-05-25 DIAGNOSIS — R911 Solitary pulmonary nodule: Secondary | ICD-10-CM | POA: Diagnosis not present

## 2019-05-25 DIAGNOSIS — M6281 Muscle weakness (generalized): Secondary | ICD-10-CM | POA: Diagnosis not present

## 2019-05-25 DIAGNOSIS — N170 Acute kidney failure with tubular necrosis: Secondary | ICD-10-CM | POA: Diagnosis not present

## 2019-05-25 DIAGNOSIS — E559 Vitamin D deficiency, unspecified: Secondary | ICD-10-CM | POA: Diagnosis not present

## 2019-05-25 DIAGNOSIS — R0602 Shortness of breath: Secondary | ICD-10-CM | POA: Diagnosis not present

## 2019-05-27 LAB — CULTURE, BLOOD (ROUTINE X 2)
Culture: NO GROWTH
Culture: NO GROWTH
Special Requests: ADEQUATE
Special Requests: ADEQUATE

## 2019-06-01 DIAGNOSIS — I130 Hypertensive heart and chronic kidney disease with heart failure and stage 1 through stage 4 chronic kidney disease, or unspecified chronic kidney disease: Secondary | ICD-10-CM | POA: Diagnosis not present

## 2019-06-01 DIAGNOSIS — J9601 Acute respiratory failure with hypoxia: Secondary | ICD-10-CM | POA: Diagnosis not present

## 2019-06-01 DIAGNOSIS — M6281 Muscle weakness (generalized): Secondary | ICD-10-CM | POA: Diagnosis not present

## 2019-06-01 DIAGNOSIS — J189 Pneumonia, unspecified organism: Secondary | ICD-10-CM | POA: Diagnosis not present

## 2019-06-01 DIAGNOSIS — I5033 Acute on chronic diastolic (congestive) heart failure: Secondary | ICD-10-CM | POA: Diagnosis not present

## 2019-06-01 DIAGNOSIS — D72829 Elevated white blood cell count, unspecified: Secondary | ICD-10-CM | POA: Diagnosis not present

## 2019-06-02 DIAGNOSIS — N184 Chronic kidney disease, stage 4 (severe): Secondary | ICD-10-CM | POA: Diagnosis not present

## 2019-06-02 DIAGNOSIS — N189 Chronic kidney disease, unspecified: Secondary | ICD-10-CM | POA: Diagnosis not present

## 2019-06-02 DIAGNOSIS — D631 Anemia in chronic kidney disease: Secondary | ICD-10-CM | POA: Diagnosis not present

## 2019-06-02 DIAGNOSIS — E211 Secondary hyperparathyroidism, not elsewhere classified: Secondary | ICD-10-CM | POA: Diagnosis not present

## 2019-06-02 DIAGNOSIS — N170 Acute kidney failure with tubular necrosis: Secondary | ICD-10-CM | POA: Diagnosis not present

## 2019-06-02 DIAGNOSIS — I5032 Chronic diastolic (congestive) heart failure: Secondary | ICD-10-CM | POA: Diagnosis not present

## 2019-06-02 DIAGNOSIS — J9601 Acute respiratory failure with hypoxia: Secondary | ICD-10-CM | POA: Diagnosis not present

## 2019-06-02 DIAGNOSIS — M6281 Muscle weakness (generalized): Secondary | ICD-10-CM | POA: Diagnosis not present

## 2019-06-03 DIAGNOSIS — M6281 Muscle weakness (generalized): Secondary | ICD-10-CM | POA: Diagnosis not present

## 2019-06-03 DIAGNOSIS — J9601 Acute respiratory failure with hypoxia: Secondary | ICD-10-CM | POA: Diagnosis not present

## 2019-06-04 DIAGNOSIS — F411 Generalized anxiety disorder: Secondary | ICD-10-CM | POA: Diagnosis not present

## 2019-06-04 DIAGNOSIS — R69 Illness, unspecified: Secondary | ICD-10-CM | POA: Diagnosis not present

## 2019-06-04 DIAGNOSIS — J9601 Acute respiratory failure with hypoxia: Secondary | ICD-10-CM | POA: Diagnosis not present

## 2019-06-04 DIAGNOSIS — M6281 Muscle weakness (generalized): Secondary | ICD-10-CM | POA: Diagnosis not present

## 2019-06-04 DIAGNOSIS — Z20828 Contact with and (suspected) exposure to other viral communicable diseases: Secondary | ICD-10-CM | POA: Diagnosis not present

## 2019-06-06 DIAGNOSIS — J9601 Acute respiratory failure with hypoxia: Secondary | ICD-10-CM | POA: Diagnosis not present

## 2019-06-06 DIAGNOSIS — U071 COVID-19: Secondary | ICD-10-CM | POA: Diagnosis not present

## 2019-06-06 DIAGNOSIS — M6281 Muscle weakness (generalized): Secondary | ICD-10-CM | POA: Diagnosis not present

## 2019-06-08 DIAGNOSIS — M6281 Muscle weakness (generalized): Secondary | ICD-10-CM | POA: Diagnosis not present

## 2019-06-08 DIAGNOSIS — J9601 Acute respiratory failure with hypoxia: Secondary | ICD-10-CM | POA: Diagnosis not present

## 2019-06-08 DIAGNOSIS — U071 COVID-19: Secondary | ICD-10-CM | POA: Diagnosis not present

## 2019-06-08 DIAGNOSIS — Z20828 Contact with and (suspected) exposure to other viral communicable diseases: Secondary | ICD-10-CM | POA: Diagnosis not present

## 2019-06-09 DIAGNOSIS — D509 Iron deficiency anemia, unspecified: Secondary | ICD-10-CM | POA: Diagnosis not present

## 2019-06-09 DIAGNOSIS — D649 Anemia, unspecified: Secondary | ICD-10-CM | POA: Diagnosis not present

## 2019-06-09 DIAGNOSIS — R5383 Other fatigue: Secondary | ICD-10-CM | POA: Diagnosis not present

## 2019-06-09 DIAGNOSIS — J9601 Acute respiratory failure with hypoxia: Secondary | ICD-10-CM | POA: Diagnosis not present

## 2019-06-09 DIAGNOSIS — M6281 Muscle weakness (generalized): Secondary | ICD-10-CM | POA: Diagnosis not present

## 2019-06-09 DIAGNOSIS — N390 Urinary tract infection, site not specified: Secondary | ICD-10-CM | POA: Diagnosis not present

## 2019-06-09 DIAGNOSIS — Z79899 Other long term (current) drug therapy: Secondary | ICD-10-CM | POA: Diagnosis not present

## 2019-06-09 DIAGNOSIS — U071 COVID-19: Secondary | ICD-10-CM | POA: Diagnosis not present

## 2019-06-09 DIAGNOSIS — E559 Vitamin D deficiency, unspecified: Secondary | ICD-10-CM | POA: Diagnosis not present

## 2019-06-09 DIAGNOSIS — N184 Chronic kidney disease, stage 4 (severe): Secondary | ICD-10-CM | POA: Diagnosis not present

## 2019-06-09 DIAGNOSIS — R319 Hematuria, unspecified: Secondary | ICD-10-CM | POA: Diagnosis not present

## 2019-06-10 DIAGNOSIS — U071 COVID-19: Secondary | ICD-10-CM | POA: Diagnosis not present

## 2019-06-10 DIAGNOSIS — J9601 Acute respiratory failure with hypoxia: Secondary | ICD-10-CM | POA: Diagnosis not present

## 2019-06-10 DIAGNOSIS — M6281 Muscle weakness (generalized): Secondary | ICD-10-CM | POA: Diagnosis not present

## 2019-06-11 DIAGNOSIS — J9601 Acute respiratory failure with hypoxia: Secondary | ICD-10-CM | POA: Diagnosis not present

## 2019-06-11 DIAGNOSIS — M6281 Muscle weakness (generalized): Secondary | ICD-10-CM | POA: Diagnosis not present

## 2019-06-11 DIAGNOSIS — U071 COVID-19: Secondary | ICD-10-CM | POA: Diagnosis not present

## 2019-06-11 DIAGNOSIS — R7401 Elevation of levels of liver transaminase levels: Secondary | ICD-10-CM | POA: Diagnosis not present

## 2019-06-12 DIAGNOSIS — R945 Abnormal results of liver function studies: Secondary | ICD-10-CM | POA: Diagnosis not present

## 2019-06-12 DIAGNOSIS — Z20828 Contact with and (suspected) exposure to other viral communicable diseases: Secondary | ICD-10-CM | POA: Diagnosis not present

## 2019-06-12 DIAGNOSIS — N281 Cyst of kidney, acquired: Secondary | ICD-10-CM | POA: Diagnosis not present

## 2019-06-12 DIAGNOSIS — K8020 Calculus of gallbladder without cholecystitis without obstruction: Secondary | ICD-10-CM | POA: Diagnosis not present

## 2019-06-15 DIAGNOSIS — I5032 Chronic diastolic (congestive) heart failure: Secondary | ICD-10-CM | POA: Diagnosis not present

## 2019-06-15 DIAGNOSIS — N184 Chronic kidney disease, stage 4 (severe): Secondary | ICD-10-CM | POA: Diagnosis not present

## 2019-06-15 DIAGNOSIS — R162 Hepatomegaly with splenomegaly, not elsewhere classified: Secondary | ICD-10-CM | POA: Diagnosis not present

## 2019-06-15 DIAGNOSIS — E1122 Type 2 diabetes mellitus with diabetic chronic kidney disease: Secondary | ICD-10-CM | POA: Diagnosis not present

## 2019-06-15 DIAGNOSIS — D631 Anemia in chronic kidney disease: Secondary | ICD-10-CM | POA: Diagnosis not present

## 2019-06-15 DIAGNOSIS — J441 Chronic obstructive pulmonary disease with (acute) exacerbation: Secondary | ICD-10-CM | POA: Diagnosis not present

## 2019-06-15 DIAGNOSIS — K8020 Calculus of gallbladder without cholecystitis without obstruction: Secondary | ICD-10-CM | POA: Diagnosis not present

## 2019-06-15 DIAGNOSIS — R69 Illness, unspecified: Secondary | ICD-10-CM | POA: Diagnosis not present

## 2019-06-15 DIAGNOSIS — I4891 Unspecified atrial fibrillation: Secondary | ICD-10-CM | POA: Diagnosis not present

## 2019-06-15 DIAGNOSIS — J9601 Acute respiratory failure with hypoxia: Secondary | ICD-10-CM | POA: Diagnosis not present

## 2019-06-15 DIAGNOSIS — R809 Proteinuria, unspecified: Secondary | ICD-10-CM | POA: Diagnosis not present

## 2019-06-15 DIAGNOSIS — E119 Type 2 diabetes mellitus without complications: Secondary | ICD-10-CM | POA: Diagnosis not present

## 2019-06-15 DIAGNOSIS — K264 Chronic or unspecified duodenal ulcer with hemorrhage: Secondary | ICD-10-CM | POA: Diagnosis not present

## 2019-06-15 DIAGNOSIS — E559 Vitamin D deficiency, unspecified: Secondary | ICD-10-CM | POA: Diagnosis not present

## 2019-06-15 DIAGNOSIS — I739 Peripheral vascular disease, unspecified: Secondary | ICD-10-CM | POA: Diagnosis not present

## 2019-06-15 DIAGNOSIS — D509 Iron deficiency anemia, unspecified: Secondary | ICD-10-CM | POA: Diagnosis not present

## 2019-06-15 DIAGNOSIS — R5383 Other fatigue: Secondary | ICD-10-CM | POA: Diagnosis not present

## 2019-06-15 DIAGNOSIS — K2961 Other gastritis with bleeding: Secondary | ICD-10-CM | POA: Diagnosis not present

## 2019-06-15 DIAGNOSIS — I10 Essential (primary) hypertension: Secondary | ICD-10-CM | POA: Diagnosis not present

## 2019-06-15 DIAGNOSIS — D649 Anemia, unspecified: Secondary | ICD-10-CM | POA: Diagnosis not present

## 2019-06-15 DIAGNOSIS — E211 Secondary hyperparathyroidism, not elsewhere classified: Secondary | ICD-10-CM | POA: Diagnosis not present

## 2019-06-15 DIAGNOSIS — I130 Hypertensive heart and chronic kidney disease with heart failure and stage 1 through stage 4 chronic kidney disease, or unspecified chronic kidney disease: Secondary | ICD-10-CM | POA: Diagnosis not present

## 2019-06-15 DIAGNOSIS — R7401 Elevation of levels of liver transaminase levels: Secondary | ICD-10-CM | POA: Diagnosis not present

## 2019-06-15 DIAGNOSIS — I129 Hypertensive chronic kidney disease with stage 1 through stage 4 chronic kidney disease, or unspecified chronic kidney disease: Secondary | ICD-10-CM | POA: Diagnosis not present

## 2019-06-15 DIAGNOSIS — Z79899 Other long term (current) drug therapy: Secondary | ICD-10-CM | POA: Diagnosis not present

## 2019-06-15 DIAGNOSIS — N170 Acute kidney failure with tubular necrosis: Secondary | ICD-10-CM | POA: Diagnosis not present

## 2019-06-15 DIAGNOSIS — N189 Chronic kidney disease, unspecified: Secondary | ICD-10-CM | POA: Diagnosis not present

## 2019-06-15 DIAGNOSIS — D134 Benign neoplasm of liver: Secondary | ICD-10-CM | POA: Diagnosis not present

## 2019-06-15 DIAGNOSIS — Z1159 Encounter for screening for other viral diseases: Secondary | ICD-10-CM | POA: Diagnosis not present

## 2019-06-15 DIAGNOSIS — Z0189 Encounter for other specified special examinations: Secondary | ICD-10-CM | POA: Diagnosis not present

## 2019-06-17 DIAGNOSIS — N184 Chronic kidney disease, stage 4 (severe): Secondary | ICD-10-CM | POA: Diagnosis not present

## 2019-06-17 DIAGNOSIS — I5032 Chronic diastolic (congestive) heart failure: Secondary | ICD-10-CM | POA: Diagnosis not present

## 2019-06-17 DIAGNOSIS — N170 Acute kidney failure with tubular necrosis: Secondary | ICD-10-CM | POA: Diagnosis not present

## 2019-06-17 DIAGNOSIS — E211 Secondary hyperparathyroidism, not elsewhere classified: Secondary | ICD-10-CM | POA: Diagnosis not present

## 2019-06-17 DIAGNOSIS — D631 Anemia in chronic kidney disease: Secondary | ICD-10-CM | POA: Diagnosis not present

## 2019-06-17 DIAGNOSIS — I129 Hypertensive chronic kidney disease with stage 1 through stage 4 chronic kidney disease, or unspecified chronic kidney disease: Secondary | ICD-10-CM | POA: Diagnosis not present

## 2019-06-17 DIAGNOSIS — N189 Chronic kidney disease, unspecified: Secondary | ICD-10-CM | POA: Diagnosis not present

## 2019-06-18 DIAGNOSIS — E559 Vitamin D deficiency, unspecified: Secondary | ICD-10-CM | POA: Diagnosis not present

## 2019-06-18 DIAGNOSIS — I5032 Chronic diastolic (congestive) heart failure: Secondary | ICD-10-CM | POA: Diagnosis not present

## 2019-06-18 DIAGNOSIS — I130 Hypertensive heart and chronic kidney disease with heart failure and stage 1 through stage 4 chronic kidney disease, or unspecified chronic kidney disease: Secondary | ICD-10-CM | POA: Diagnosis not present

## 2019-06-18 DIAGNOSIS — D509 Iron deficiency anemia, unspecified: Secondary | ICD-10-CM | POA: Diagnosis not present

## 2019-06-22 DIAGNOSIS — R7401 Elevation of levels of liver transaminase levels: Secondary | ICD-10-CM | POA: Diagnosis not present

## 2019-06-22 DIAGNOSIS — N184 Chronic kidney disease, stage 4 (severe): Secondary | ICD-10-CM | POA: Diagnosis not present

## 2019-06-22 DIAGNOSIS — E1122 Type 2 diabetes mellitus with diabetic chronic kidney disease: Secondary | ICD-10-CM | POA: Diagnosis not present

## 2019-06-25 DIAGNOSIS — D631 Anemia in chronic kidney disease: Secondary | ICD-10-CM | POA: Diagnosis not present

## 2019-06-25 DIAGNOSIS — N170 Acute kidney failure with tubular necrosis: Secondary | ICD-10-CM | POA: Diagnosis not present

## 2019-06-25 DIAGNOSIS — N184 Chronic kidney disease, stage 4 (severe): Secondary | ICD-10-CM | POA: Diagnosis not present

## 2019-06-25 DIAGNOSIS — I129 Hypertensive chronic kidney disease with stage 1 through stage 4 chronic kidney disease, or unspecified chronic kidney disease: Secondary | ICD-10-CM | POA: Diagnosis not present

## 2019-06-25 DIAGNOSIS — E211 Secondary hyperparathyroidism, not elsewhere classified: Secondary | ICD-10-CM | POA: Diagnosis not present

## 2019-06-25 DIAGNOSIS — N189 Chronic kidney disease, unspecified: Secondary | ICD-10-CM | POA: Diagnosis not present

## 2019-06-25 DIAGNOSIS — I5032 Chronic diastolic (congestive) heart failure: Secondary | ICD-10-CM | POA: Diagnosis not present

## 2019-06-25 DIAGNOSIS — R809 Proteinuria, unspecified: Secondary | ICD-10-CM | POA: Diagnosis not present

## 2019-07-14 DIAGNOSIS — U071 COVID-19: Secondary | ICD-10-CM | POA: Diagnosis not present

## 2019-07-14 DIAGNOSIS — I5032 Chronic diastolic (congestive) heart failure: Secondary | ICD-10-CM | POA: Diagnosis not present

## 2019-07-14 DIAGNOSIS — J441 Chronic obstructive pulmonary disease with (acute) exacerbation: Secondary | ICD-10-CM | POA: Diagnosis not present

## 2019-07-30 DIAGNOSIS — F411 Generalized anxiety disorder: Secondary | ICD-10-CM | POA: Diagnosis not present

## 2019-07-30 DIAGNOSIS — R69 Illness, unspecified: Secondary | ICD-10-CM | POA: Diagnosis not present

## 2019-08-06 DIAGNOSIS — R69 Illness, unspecified: Secondary | ICD-10-CM | POA: Diagnosis not present

## 2019-08-06 DIAGNOSIS — J441 Chronic obstructive pulmonary disease with (acute) exacerbation: Secondary | ICD-10-CM | POA: Diagnosis not present

## 2019-08-06 DIAGNOSIS — E1122 Type 2 diabetes mellitus with diabetic chronic kidney disease: Secondary | ICD-10-CM | POA: Diagnosis not present

## 2019-08-06 DIAGNOSIS — I5032 Chronic diastolic (congestive) heart failure: Secondary | ICD-10-CM | POA: Diagnosis not present

## 2019-08-27 DIAGNOSIS — R69 Illness, unspecified: Secondary | ICD-10-CM | POA: Diagnosis not present

## 2019-08-27 DIAGNOSIS — F411 Generalized anxiety disorder: Secondary | ICD-10-CM | POA: Diagnosis not present

## 2019-09-22 DIAGNOSIS — R69 Illness, unspecified: Secondary | ICD-10-CM | POA: Diagnosis not present

## 2019-11-16 ENCOUNTER — Emergency Department (HOSPITAL_COMMUNITY): Payer: Medicare (Managed Care)

## 2019-11-16 ENCOUNTER — Other Ambulatory Visit: Payer: Self-pay

## 2019-11-16 ENCOUNTER — Inpatient Hospital Stay (HOSPITAL_COMMUNITY)
Admission: AD | Admit: 2019-11-16 | Discharge: 2019-11-18 | DRG: 291 | Disposition: A | Discharge status: Skilled Nursing Facility | Payer: Medicare (Managed Care) | Source: Skilled Nursing Facility | Attending: Family Medicine | Admitting: Family Medicine

## 2019-11-16 ENCOUNTER — Encounter (HOSPITAL_COMMUNITY): Payer: Self-pay | Admitting: *Deleted

## 2019-11-16 DIAGNOSIS — Z20822 Contact with and (suspected) exposure to covid-19: Secondary | ICD-10-CM | POA: Diagnosis present

## 2019-11-16 DIAGNOSIS — E7800 Pure hypercholesterolemia, unspecified: Secondary | ICD-10-CM | POA: Diagnosis present

## 2019-11-16 DIAGNOSIS — L03116 Cellulitis of left lower limb: Secondary | ICD-10-CM | POA: Diagnosis present

## 2019-11-16 DIAGNOSIS — K760 Fatty (change of) liver, not elsewhere classified: Secondary | ICD-10-CM | POA: Diagnosis present

## 2019-11-16 DIAGNOSIS — Z8673 Personal history of transient ischemic attack (TIA), and cerebral infarction without residual deficits: Secondary | ICD-10-CM

## 2019-11-16 DIAGNOSIS — J189 Pneumonia, unspecified organism: Secondary | ICD-10-CM | POA: Diagnosis present

## 2019-11-16 DIAGNOSIS — E785 Hyperlipidemia, unspecified: Secondary | ICD-10-CM | POA: Diagnosis present

## 2019-11-16 DIAGNOSIS — J9601 Acute respiratory failure with hypoxia: Secondary | ICD-10-CM | POA: Diagnosis present

## 2019-11-16 DIAGNOSIS — I132 Hypertensive heart and chronic kidney disease with heart failure and with stage 5 chronic kidney disease, or end stage renal disease: Principal | ICD-10-CM | POA: Diagnosis present

## 2019-11-16 DIAGNOSIS — F419 Anxiety disorder, unspecified: Secondary | ICD-10-CM | POA: Diagnosis present

## 2019-11-16 DIAGNOSIS — K219 Gastro-esophageal reflux disease without esophagitis: Secondary | ICD-10-CM | POA: Diagnosis present

## 2019-11-16 DIAGNOSIS — J9622 Acute and chronic respiratory failure with hypercapnia: Secondary | ICD-10-CM | POA: Diagnosis present

## 2019-11-16 DIAGNOSIS — Z87891 Personal history of nicotine dependence: Secondary | ICD-10-CM

## 2019-11-16 DIAGNOSIS — Z6841 Body Mass Index (BMI) 40.0 and over, adult: Secondary | ICD-10-CM

## 2019-11-16 DIAGNOSIS — J441 Chronic obstructive pulmonary disease with (acute) exacerbation: Secondary | ICD-10-CM | POA: Diagnosis present

## 2019-11-16 DIAGNOSIS — J9621 Acute and chronic respiratory failure with hypoxia: Secondary | ICD-10-CM | POA: Diagnosis present

## 2019-11-16 DIAGNOSIS — G9341 Metabolic encephalopathy: Secondary | ICD-10-CM | POA: Diagnosis present

## 2019-11-16 DIAGNOSIS — D649 Anemia, unspecified: Secondary | ICD-10-CM | POA: Diagnosis present

## 2019-11-16 DIAGNOSIS — L03115 Cellulitis of right lower limb: Secondary | ICD-10-CM | POA: Diagnosis present

## 2019-11-16 DIAGNOSIS — Z86718 Personal history of other venous thrombosis and embolism: Secondary | ICD-10-CM

## 2019-11-16 DIAGNOSIS — F329 Major depressive disorder, single episode, unspecified: Secondary | ICD-10-CM | POA: Diagnosis present

## 2019-11-16 DIAGNOSIS — Z7982 Long term (current) use of aspirin: Secondary | ICD-10-CM

## 2019-11-16 DIAGNOSIS — J440 Chronic obstructive pulmonary disease with acute lower respiratory infection: Secondary | ICD-10-CM | POA: Diagnosis present

## 2019-11-16 DIAGNOSIS — I5033 Acute on chronic diastolic (congestive) heart failure: Secondary | ICD-10-CM | POA: Diagnosis present

## 2019-11-16 DIAGNOSIS — Z794 Long term (current) use of insulin: Secondary | ICD-10-CM

## 2019-11-16 DIAGNOSIS — Z8711 Personal history of peptic ulcer disease: Secondary | ICD-10-CM

## 2019-11-16 DIAGNOSIS — D631 Anemia in chronic kidney disease: Secondary | ICD-10-CM | POA: Diagnosis present

## 2019-11-16 DIAGNOSIS — G894 Chronic pain syndrome: Secondary | ICD-10-CM | POA: Diagnosis present

## 2019-11-16 DIAGNOSIS — J9691 Respiratory failure, unspecified with hypoxia: Secondary | ICD-10-CM | POA: Diagnosis present

## 2019-11-16 DIAGNOSIS — N185 Chronic kidney disease, stage 5: Secondary | ICD-10-CM | POA: Diagnosis present

## 2019-11-16 DIAGNOSIS — M869 Osteomyelitis, unspecified: Secondary | ICD-10-CM | POA: Diagnosis present

## 2019-11-16 DIAGNOSIS — E1165 Type 2 diabetes mellitus with hyperglycemia: Secondary | ICD-10-CM | POA: Diagnosis present

## 2019-11-16 DIAGNOSIS — E1142 Type 2 diabetes mellitus with diabetic polyneuropathy: Secondary | ICD-10-CM | POA: Diagnosis present

## 2019-11-16 DIAGNOSIS — E1122 Type 2 diabetes mellitus with diabetic chronic kidney disease: Secondary | ICD-10-CM | POA: Diagnosis present

## 2019-11-16 DIAGNOSIS — N184 Chronic kidney disease, stage 4 (severe): Secondary | ICD-10-CM | POA: Diagnosis present

## 2019-11-16 DIAGNOSIS — E1151 Type 2 diabetes mellitus with diabetic peripheral angiopathy without gangrene: Secondary | ICD-10-CM | POA: Diagnosis present

## 2019-11-16 DIAGNOSIS — I10 Essential (primary) hypertension: Secondary | ICD-10-CM | POA: Diagnosis present

## 2019-11-16 DIAGNOSIS — Z79899 Other long term (current) drug therapy: Secondary | ICD-10-CM

## 2019-11-16 DIAGNOSIS — Z91018 Allergy to other foods: Secondary | ICD-10-CM

## 2019-11-16 DIAGNOSIS — Z888 Allergy status to other drugs, medicaments and biological substances status: Secondary | ICD-10-CM

## 2019-11-16 DIAGNOSIS — I509 Heart failure, unspecified: Secondary | ICD-10-CM

## 2019-11-16 HISTORY — DX: Gastro-esophageal reflux disease without esophagitis: K21.9

## 2019-11-16 HISTORY — DX: Acute embolism and thrombosis of unspecified deep veins of unspecified lower extremity: I82.409

## 2019-11-16 HISTORY — DX: Osteomyelitis, unspecified: M86.9

## 2019-11-16 HISTORY — DX: Chronic or unspecified duodenal ulcer with hemorrhage: K26.4

## 2019-11-16 HISTORY — DX: Anemia, unspecified: D64.9

## 2019-11-16 LAB — CBC WITH DIFFERENTIAL/PLATELET
Abs Immature Granulocytes: 0.18 10*3/uL — ABNORMAL HIGH (ref 0.00–0.07)
Basophils Absolute: 0.1 10*3/uL (ref 0.0–0.1)
Basophils Relative: 0 %
Eosinophils Absolute: 0.7 10*3/uL — ABNORMAL HIGH (ref 0.0–0.5)
Eosinophils Relative: 4 %
HCT: 31.2 % — ABNORMAL LOW (ref 39.0–52.0)
Hemoglobin: 9.5 g/dL — ABNORMAL LOW (ref 13.0–17.0)
Immature Granulocytes: 1 %
Lymphocytes Relative: 14 %
Lymphs Abs: 2.4 10*3/uL (ref 0.7–4.0)
MCH: 27.2 pg (ref 26.0–34.0)
MCHC: 30.4 g/dL (ref 30.0–36.0)
MCV: 89.4 fL (ref 80.0–100.0)
Monocytes Absolute: 1 10*3/uL (ref 0.1–1.0)
Monocytes Relative: 6 %
Neutro Abs: 12.4 10*3/uL — ABNORMAL HIGH (ref 1.7–7.7)
Neutrophils Relative %: 75 %
Platelets: 248 10*3/uL (ref 150–400)
RBC: 3.49 MIL/uL — ABNORMAL LOW (ref 4.22–5.81)
RDW: 17.2 % — ABNORMAL HIGH (ref 11.5–15.5)
WBC: 16.8 10*3/uL — ABNORMAL HIGH (ref 4.0–10.5)
nRBC: 0.2 % (ref 0.0–0.2)

## 2019-11-16 LAB — COMPREHENSIVE METABOLIC PANEL
ALT: 8 U/L (ref 0–44)
AST: 13 U/L — ABNORMAL LOW (ref 15–41)
Albumin: 3.9 g/dL (ref 3.5–5.0)
Alkaline Phosphatase: 89 U/L (ref 38–126)
Anion gap: 16 — ABNORMAL HIGH (ref 5–15)
BUN: 45 mg/dL — ABNORMAL HIGH (ref 8–23)
CO2: 26 mmol/L (ref 22–32)
Calcium: 9.2 mg/dL (ref 8.9–10.3)
Chloride: 101 mmol/L (ref 98–111)
Creatinine, Ser: 3.62 mg/dL — ABNORMAL HIGH (ref 0.61–1.24)
GFR calc Af Amer: 19 mL/min — ABNORMAL LOW (ref >60)
GFR calc non Af Amer: 16 mL/min — ABNORMAL LOW (ref >60)
Glucose, Bld: 252 mg/dL — ABNORMAL HIGH (ref 70–99)
Potassium: 3.9 mmol/L (ref 3.5–5.1)
Sodium: 143 mmol/L (ref 135–145)
Total Bilirubin: 0.8 mg/dL (ref 0.3–1.2)
Total Protein: 8.7 g/dL — ABNORMAL HIGH (ref 6.5–8.1)

## 2019-11-16 LAB — HEMOGLOBIN A1C
Hgb A1c MFr Bld: 7 % — ABNORMAL HIGH (ref 4.8–5.6)
Mean Plasma Glucose: 154.2 mg/dL

## 2019-11-16 LAB — MAGNESIUM: Magnesium: 2.1 mg/dL (ref 1.7–2.4)

## 2019-11-16 LAB — BLOOD GAS, VENOUS
Acid-Base Excess: 3.2 mmol/L — ABNORMAL HIGH (ref 0.0–2.0)
Bicarbonate: 26.8 mmol/L (ref 20.0–28.0)
FIO2: 45
O2 Saturation: 87.7 %
Patient temperature: 39
pCO2, Ven: 48.7 mmHg (ref 44.0–60.0)
pH, Ven: 7.377 (ref 7.250–7.430)
pO2, Ven: 57.9 mmHg — ABNORMAL HIGH (ref 32.0–45.0)

## 2019-11-16 LAB — GLUCOSE, CAPILLARY
Glucose-Capillary: 277 mg/dL — ABNORMAL HIGH (ref 70–99)
Glucose-Capillary: 247 mg/dL — ABNORMAL HIGH (ref 70–99)
Glucose-Capillary: 257 mg/dL — ABNORMAL HIGH (ref 70–99)
Glucose-Capillary: 259 mg/dL — ABNORMAL HIGH (ref 70–99)

## 2019-11-16 LAB — SARS CORONAVIRUS 2 BY RT PCR (HOSPITAL ORDER, PERFORMED IN ~~LOC~~ HOSPITAL LAB): SARS Coronavirus 2: NEGATIVE

## 2019-11-16 LAB — PROCALCITONIN: Procalcitonin: 0.26 ng/mL

## 2019-11-16 LAB — MRSA PCR SCREENING: MRSA by PCR: NEGATIVE

## 2019-11-16 LAB — BRAIN NATRIURETIC PEPTIDE: B Natriuretic Peptide: 386 pg/mL — ABNORMAL HIGH (ref 0.0–100.0)

## 2019-11-16 LAB — TROPONIN I (HIGH SENSITIVITY)
Troponin I (High Sensitivity): 10 ng/L (ref <18)
Troponin I (High Sensitivity): 15 ng/L (ref <18)

## 2019-11-16 MED ORDER — SODIUM CHLORIDE 0.9 % IV SOLN
2.0000 g | INTRAVENOUS | Status: DC
  Administered 2019-11-17 – 2019-11-18 (×2): 2 g via INTRAVENOUS
  Filled 2019-11-16 (×2): qty 20

## 2019-11-16 MED ORDER — TAMSULOSIN HCL 0.4 MG PO CAPS
0.4000 mg | ORAL_CAPSULE | Freq: Every day | ORAL | Status: DC
  Administered 2019-11-16 – 2019-11-17 (×2): 0.4 mg via ORAL
  Filled 2019-11-16 (×2): qty 1

## 2019-11-16 MED ORDER — CHLORHEXIDINE GLUCONATE CLOTH 2 % EX PADS
6.0000 | MEDICATED_PAD | Freq: Every day | CUTANEOUS | Status: DC
  Administered 2019-11-16 – 2019-11-18 (×3): 6 via TOPICAL

## 2019-11-16 MED ORDER — ACETAMINOPHEN 325 MG PO TABS: 650.0000 mg | ORAL_TABLET | Freq: Four times a day (QID) | ORAL | Status: DC | PRN

## 2019-11-16 MED ORDER — NITROGLYCERIN 2 % TD OINT
1.0000 [in_us] | TOPICAL_OINTMENT | Freq: Once | TRANSDERMAL | Status: AC
  Administered 2019-11-16: 1 [in_us] via TOPICAL

## 2019-11-16 MED ORDER — HYDRALAZINE HCL 20 MG/ML IJ SOLN
10.0000 mg | Freq: Four times a day (QID) | INTRAMUSCULAR | Status: DC | PRN
  Administered 2019-11-16: 10 mg via INTRAVENOUS
  Filled 2019-11-16: qty 1

## 2019-11-16 MED ORDER — FUROSEMIDE 10 MG/ML IJ SOLN
60.0000 mg | Freq: Two times a day (BID) | INTRAMUSCULAR | Status: DC
  Administered 2019-11-16 – 2019-11-18 (×4): 60 mg via INTRAVENOUS
  Filled 2019-11-16 (×4): qty 6

## 2019-11-16 MED ORDER — ALLOPURINOL 100 MG PO TABS
100.0000 mg | ORAL_TABLET | Freq: Every day | ORAL | Status: DC
  Administered 2019-11-16 – 2019-11-17 (×2): 100 mg via ORAL
  Filled 2019-11-16 (×2): qty 1

## 2019-11-16 MED ORDER — ADULT MULTIVITAMIN W/MINERALS CH
1.0000 | ORAL_TABLET | Freq: Every day | ORAL | Status: DC
  Administered 2019-11-16 – 2019-11-18 (×3): 1 via ORAL
  Filled 2019-11-16 (×3): qty 1

## 2019-11-16 MED ORDER — HYDROCODONE-ACETAMINOPHEN 5-325 MG PO TABS
1.0000 | ORAL_TABLET | Freq: Three times a day (TID) | ORAL | Status: DC | PRN
  Administered 2019-11-17 – 2019-11-18 (×2): 1 via ORAL
  Filled 2019-11-16 (×2): qty 1

## 2019-11-16 MED ORDER — IPRATROPIUM BROMIDE 0.02 % IN SOLN: 0.5000 mg | Freq: Four times a day (QID) | RESPIRATORY_TRACT | Status: DC | PRN

## 2019-11-16 MED ORDER — GABAPENTIN 400 MG PO CAPS
400.0000 mg | ORAL_CAPSULE | Freq: Three times a day (TID) | ORAL | Status: DC
  Administered 2019-11-16 – 2019-11-18 (×7): 400 mg via ORAL
  Filled 2019-11-16 (×7): qty 1

## 2019-11-16 MED ORDER — IPRATROPIUM BROMIDE 0.02 % IN SOLN
RESPIRATORY_TRACT | Status: AC
  Administered 2019-11-16: 0.5 mg
  Filled 2019-11-16: qty 2.5

## 2019-11-16 MED ORDER — FLUTICASONE FUROATE-VILANTEROL 100-25 MCG/INH IN AEPB
2.0000 | INHALATION_SPRAY | Freq: Every day | RESPIRATORY_TRACT | Status: DC
  Administered 2019-11-16 – 2019-11-18 (×3): 2 via RESPIRATORY_TRACT
  Filled 2019-11-16: qty 28

## 2019-11-16 MED ORDER — LIDOCAINE 5 % EX PTCH: 1.0000 | MEDICATED_PATCH | Freq: Two times a day (BID) | CUTANEOUS | Status: DC

## 2019-11-16 MED ORDER — CALCIUM CARBONATE ANTACID 500 MG PO CHEW
2.0000 | CHEWABLE_TABLET | Freq: Two times a day (BID) | ORAL | Status: DC
  Administered 2019-11-16 – 2019-11-18 (×5): 400 mg via ORAL
  Filled 2019-11-16 (×5): qty 2

## 2019-11-16 MED ORDER — VITAMIN D 25 MCG (1000 UNIT) PO TABS
3000.0000 [IU] | ORAL_TABLET | Freq: Every day | ORAL | Status: DC
  Administered 2019-11-16 – 2019-11-18 (×3): 3000 [IU] via ORAL
  Filled 2019-11-16 (×3): qty 3

## 2019-11-16 MED ORDER — FUROSEMIDE 10 MG/ML IJ SOLN: 60.0000 mg | Freq: Once | INTRAMUSCULAR | Status: AC

## 2019-11-16 MED ORDER — FUROSEMIDE 10 MG/ML IJ SOLN
INTRAMUSCULAR | Status: AC
  Administered 2019-11-16: 60 mg via INTRAVENOUS
  Filled 2019-11-16: qty 6

## 2019-11-16 MED ORDER — ONE-DAILY MULTI VITAMINS PO TABS: 1.0000 | ORAL_TABLET | Freq: Every day | ORAL | Status: DC

## 2019-11-16 MED ORDER — AMLODIPINE BESYLATE 5 MG PO TABS
10.0000 mg | ORAL_TABLET | Freq: Every day | ORAL | Status: DC
  Administered 2019-11-16 – 2019-11-18 (×3): 10 mg via ORAL
  Filled 2019-11-16 (×3): qty 2

## 2019-11-16 MED ORDER — HYDROXYZINE HCL 25 MG PO TABS: 50.0000 mg | ORAL_TABLET | Freq: Three times a day (TID) | ORAL | Status: DC | PRN

## 2019-11-16 MED ORDER — METHYLPREDNISOLONE SODIUM SUCC 125 MG IJ SOLR
125.0000 mg | Freq: Once | INTRAMUSCULAR | Status: AC
  Administered 2019-11-16: 125 mg via INTRAVENOUS
  Filled 2019-11-16: qty 2

## 2019-11-16 MED ORDER — FLUOXETINE HCL 20 MG PO CAPS
20.0000 mg | ORAL_CAPSULE | Freq: Every day | ORAL | Status: DC
  Administered 2019-11-16 – 2019-11-18 (×3): 20 mg via ORAL
  Filled 2019-11-16 (×3): qty 1

## 2019-11-16 MED ORDER — VITAMIN B-12 1000 MCG PO TABS
1000.0000 ug | ORAL_TABLET | Freq: Every day | ORAL | Status: DC
  Administered 2019-11-16 – 2019-11-18 (×3): 1000 ug via ORAL
  Filled 2019-11-16 (×3): qty 1

## 2019-11-16 MED ORDER — SODIUM CHLORIDE 0.9 % IV SOLN
INTRAVENOUS | Status: DC | PRN
  Administered 2019-11-16: 250 mL via INTRAVENOUS

## 2019-11-16 MED ORDER — SODIUM CHLORIDE 0.9 % IV SOLN
500.0000 mg | INTRAVENOUS | Status: DC
  Administered 2019-11-17 – 2019-11-18 (×2): 500 mg via INTRAVENOUS
  Filled 2019-11-16 (×2): qty 500

## 2019-11-16 MED ORDER — OMEGA-3-ACID ETHYL ESTERS 1 G PO CAPS
1.0000 g | ORAL_CAPSULE | Freq: Two times a day (BID) | ORAL | Status: DC
  Administered 2019-11-16 – 2019-11-18 (×5): 1 g via ORAL
  Filled 2019-11-16 (×5): qty 1

## 2019-11-16 MED ORDER — PANTOPRAZOLE SODIUM 40 MG PO TBEC
40.0000 mg | DELAYED_RELEASE_TABLET | Freq: Every day | ORAL | Status: DC
  Administered 2019-11-16 – 2019-11-18 (×3): 40 mg via ORAL
  Filled 2019-11-16 (×3): qty 1

## 2019-11-16 MED ORDER — ALBUTEROL SULFATE (2.5 MG/3ML) 0.083% IN NEBU
2.5000 mg | INHALATION_SOLUTION | Freq: Four times a day (QID) | RESPIRATORY_TRACT | Status: DC | PRN
  Administered 2019-11-17 – 2019-11-18 (×2): 2.5 mg via RESPIRATORY_TRACT
  Filled 2019-11-16 (×2): qty 3

## 2019-11-16 MED ORDER — CALCITRIOL 0.25 MCG PO CAPS
0.2500 ug | ORAL_CAPSULE | Freq: Every day | ORAL | Status: DC
  Administered 2019-11-16 – 2019-11-18 (×3): 0.25 ug via ORAL
  Filled 2019-11-16 (×3): qty 1

## 2019-11-16 MED ORDER — SODIUM CHLORIDE 0.9 % IV SOLN
500.0000 mg | Freq: Once | INTRAVENOUS | Status: AC
  Administered 2019-11-16: 500 mg via INTRAVENOUS
  Filled 2019-11-16: qty 500

## 2019-11-16 MED ORDER — MAGNESIUM OXIDE 400 (241.3 MG) MG PO TABS
400.0000 mg | ORAL_TABLET | Freq: Every day | ORAL | Status: DC
  Administered 2019-11-16 – 2019-11-18 (×3): 400 mg via ORAL
  Filled 2019-11-16 (×6): qty 1

## 2019-11-16 MED ORDER — ASPIRIN EC 81 MG PO TBEC
81.0000 mg | DELAYED_RELEASE_TABLET | Freq: Every day | ORAL | Status: DC
  Administered 2019-11-16 – 2019-11-18 (×3): 81 mg via ORAL
  Filled 2019-11-16 (×3): qty 1

## 2019-11-16 MED ORDER — INSULIN GLARGINE 100 UNIT/ML ~~LOC~~ SOLN
38.0000 [IU] | Freq: Every day | SUBCUTANEOUS | Status: DC
  Administered 2019-11-16 – 2019-11-17 (×2): 38 [IU] via SUBCUTANEOUS
  Filled 2019-11-16 (×3): qty 0.38

## 2019-11-16 MED ORDER — INSULIN ASPART 100 UNIT/ML ~~LOC~~ SOLN
0.0000 [IU] | Freq: Three times a day (TID) | SUBCUTANEOUS | Status: DC
  Administered 2019-11-16: 8 [IU] via SUBCUTANEOUS
  Administered 2019-11-16: 5 [IU] via SUBCUTANEOUS
  Administered 2019-11-16: 8 [IU] via SUBCUTANEOUS
  Administered 2019-11-17 (×3): 3 [IU] via SUBCUTANEOUS
  Administered 2019-11-18: 2 [IU] via SUBCUTANEOUS
  Administered 2019-11-18: 3 [IU] via SUBCUTANEOUS

## 2019-11-16 MED ORDER — ALBUTEROL (5 MG/ML) CONTINUOUS INHALATION SOLN
INHALATION_SOLUTION | RESPIRATORY_TRACT | Status: AC
  Administered 2019-11-16: 10 mg via RESPIRATORY_TRACT
  Filled 2019-11-16: qty 20

## 2019-11-16 MED ORDER — ALPRAZOLAM 0.5 MG PO TABS
0.5000 mg | ORAL_TABLET | Freq: Two times a day (BID) | ORAL | Status: DC | PRN
  Administered 2019-11-16: 0.5 mg via ORAL
  Filled 2019-11-16: qty 1

## 2019-11-16 MED ORDER — OMEGA-3 FATTY ACIDS 1000 MG PO CAPS: 1.0000 g | ORAL_CAPSULE | Freq: Two times a day (BID) | ORAL | Status: DC

## 2019-11-16 MED ORDER — ACETAMINOPHEN 650 MG RE SUPP: 650.0000 mg | Freq: Four times a day (QID) | RECTAL | Status: DC | PRN

## 2019-11-16 MED ORDER — SODIUM CHLORIDE 0.9 % IV SOLN
1.0000 g | Freq: Once | INTRAVENOUS | Status: AC
  Administered 2019-11-16: 1 g via INTRAVENOUS
  Filled 2019-11-16: qty 10

## 2019-11-16 MED ORDER — METOPROLOL TARTRATE 50 MG PO TABS
50.0000 mg | ORAL_TABLET | Freq: Two times a day (BID) | ORAL | Status: DC
  Administered 2019-11-16 – 2019-11-18 (×5): 50 mg via ORAL
  Filled 2019-11-16 (×6): qty 1

## 2019-11-16 NOTE — Evaluation (Signed)
Physical Therapy Evaluation Patient Details Name: SAMBA CUMBA MRN: [id number] DOB: 1950-08-04 Today's Date: 11/16/2019   History of Present Illness  KAJ VASIL is a 69 y.o. male with medical history significant of normocytic anemia renal disease, anxiety, depression, spinal stenosis, diabetic peripheral neuropathy, chronic pain syndrome, stage V CKD, COPD, history of UGI due to duodenal ulcer, history of right upper extremity DVT, hyperlipidemia, hypertension, osteomyelitis of the ankle, peripheral edema, peripheral vascular disease, history of subcu left basal ganglia other nonhemorrhagic lacunar stroke, basal tremor, type 2 diabetes mellitus, diastolic CHF with last EF in December 2020 of 60 to 65% who is brought to the emergency department due to progressively worse dyspnea with hypoxia in the 70s on room air associated with chest pressure and mild confusion.    Clinical Impression  Patient functioning at baseline for functional mobility which is non-ambulatory and assisted for transfers via mechanical lift at SNF.  Plan:  Patient discharged from physical therapy to care of nursing for out of bed to chair as tolerated via mechanical lift for length of stay.     Follow Up Recommendations SNF    Equipment Recommendations  None recommended by PT    Recommendations for Other Services       Precautions / Restrictions Precautions Precautions: Fall Restrictions Weight Bearing Restrictions: No      Mobility  Bed Mobility Overal bed mobility: Needs Assistance Bed Mobility: Supine to Sit;Sit to Supine     Supine to sit: Min assist;Mod assist Sit to supine: Min assist;Mod assist   General bed mobility comments: slow labored movement  Transfers                    Ambulation/Gait                Stairs            Wheelchair Mobility    Modified Rankin (Stroke Patients Only)       Balance Overall balance assessment: Needs  assistance Sitting-balance support: Feet supported;No upper extremity supported Sitting balance-Leahy Scale: Fair Sitting balance - Comments: seated at EOB                                     Pertinent Vitals/Pain Pain Assessment: No/denies pain    Home Living Family/patient expects to be discharged to:: Skilled nursing facility                      Prior Function Level of [place]: Needs assistance   Gait / Transfers Assistance Needed: non-ambulatory, uses hoyar lift for transfers at SNF  ADL's / Homemaking Assistance Needed: assisted by SNF staff        Hand Dominance   Dominant Hand: Right    Extremity/Trunk Assessment   Upper Extremity Assessment Upper Extremity Assessment: Defer to OT evaluation    Lower Extremity Assessment Lower Extremity Assessment: Generalized weakness    Cervical / Trunk Assessment Cervical / Trunk Assessment: Normal  Communication   Communication: No difficulties  Cognition Arousal/Alertness: Awake/alert Behavior During Therapy: WFL for tasks assessed/performed Overall Cognitive Status: Within Functional Limits for tasks assessed                                        General Comments      Exercises  Assessment/Plan    PT Assessment Patent does not need any further PT services  PT Problem List         PT Treatment Interventions      PT Goals (Current goals can be found in the Care Plan section)  Acute Rehab PT Goals Patient Stated Goal: return to SNF to be assisted by staff PT Goal Formulation: With patient Time For Goal Achievement: 11/16/19 Potential to Achieve Goals: Good    Frequency     Barriers to discharge        Co-evaluation               AM-PAC PT "6 Clicks" Mobility  Outcome Measure Help needed turning from your back to your side while in a flat bed without using bedrails?: A Lot Help needed moving from lying on your back to sitting on the side of  a flat bed without using bedrails?: A Lot Help needed moving to and from a bed to a chair (including a wheelchair)?: Total Help needed standing up from a chair using your arms (e.g., wheelchair or bedside chair)?: Total Help needed to walk in hospital room?: Total Help needed climbing 3-5 steps with a railing? : Total 6 Click Score: 8    End of Session Equipment Utilized During Treatment: Oxygen Activity Tolerance: Patient tolerated treatment well;Patient limited by fatigue Patient left: in bed;with call bell/phone within reach Nurse Communication: Mobility status PT Visit Diagnosis: Unsteadiness on feet (R26.81);Other abnormalities of gait and mobility (R26.89);Muscle weakness (generalized) (M62.81)    Time: 2751-7001 PT Time Calculation (min) (ACUTE ONLY): 24 min   Charges:   PT Evaluation $PT Eval Moderate Complexity: 1 Mod PT Treatments $Therapeutic Activity: 23-37 mins        2:00 PM, 11/16/19 Lonell Grandchild, MPT Physical Therapist with [facility] [phone] office [phone] mobile phone

## 2019-11-16 NOTE — Progress Notes (Signed)
Patient taken off BIPAP for transport to ICU due to good VBG results at 0345 and good respiratory status currently. Placed on 4 lpm nasal cannula. Patient tolerated well. Machine placed in ICU room on standby.

## 2019-11-16 NOTE — H&P (Signed)
History and Physical    Eric Scott XNA:[id number] DOB: July 19, 1950 DOA: 11/16/2019  PCP: [name], MD   Patient coming from: [facility].  I have personally briefly reviewed patient's old medical records in [place]  Chief Complaint: Shortness of breath.  HPI: Eric Scott is a 69 y.o. male with medical history significant of normocytic anemia renal disease, anxiety, depression, spinal stenosis, diabetic peripheral neuropathy, chronic pain syndrome, stage V CKD, COPD, history of UGI due to duodenal ulcer, history of right upper extremity DVT, hyperlipidemia, hypertension, osteomyelitis of the ankle, peripheral edema, peripheral vascular disease, history of subcu left basal ganglia other nonhemorrhagic lacunar stroke, basal tremor, type 2 diabetes mellitus, diastolic CHF with last EF in December 2020 of 60 to 65% who is brought to the emergency department due to progressively worse dyspnea with hypoxia in the 70s on room air associated with chest pressure and mild confusion.    The patient stated that he has been feeling mildly dyspneic for the past 2 to 3 days.  However, around 10 PM while in bed he became dyspneic.  The nursing facility increased his oxygen to 5 LPM and this improved his oxygen saturation to the 80s.  EMS reports that when they put him on the stretcher the patient again decreased to the 70s and became less responsive.  He was placed on NRB oxygen and O2 sat improved to 91% after this.  However, the patient was still tachypneic with respiratory distress when he arrived to the emergency department and was immediately placed on BiPAP ventilation mode.  He stated that he is feeling better, but still mildly winded when trying to converse.  He added that he has been coughing up whitish sputum for the past 2-3 days.  No chest pain now.  He denies headache, sore throat, abdominal pain, nausea, vomiting, diarrhea, constipation, melena or hematochezia.  No dysuria,  frequency or hematuria.  No polyuria, polydipsia, polyphagia or blurred vision.  ED Course: Initial vital signs temperature 100.2 F, pulse 98, respirations 24, blood pressure 151/102 mmHg and O2 sat 100% on BiPAP ventilation mode.  He received a full inch of Nitropaste, furosemide 60 mg IVP x1, Solu-Medrol 125 mg IVP x1, ceftriaxone 1 g and azithromycin 500 mg IVPB.  Blood cultures x2 were drawn before antibiotics were given.  CBC showed a white count of 16.8, hemoglobin 9.5 g/dL and platelets 248.  Troponin was 10 and then 15 ng/L.  BNP 386.0 pg/mL.  CMP shows normal electrolytes.  Glucose 252, BUN 45 and creatinine 3.62 mg/dL.  Hepatic functions show a total protein of 8.7 g/dL, the rest of the LFTs are unremarkable.  Procalcitonin was 0.26 ng/mL.  SARS coronavirus 2 was negative.  His chest radiograph shows cardiomegaly with vascular congestion and probably early interstitial edema.  Please see image and full radiology report for further detail.  Review of Systems: As per HPI otherwise all other systems reviewed and are negative.  Past Medical History:  Diagnosis Date  . Anemia   . Anxiety   . Chronic pain    legs, back; MRI 05/2012 with mild thoracic degenerative changes no spinal stenosis   . 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 COPD (chronic obstructive pulmonary disease) ([place])   . Depression   . Diabetic peripheral neuropathy ([place])    "chronic" (05/27/2012)  . Diastolic CHF ([place])   . Duodenal ulcer hemorrhage   . DVT (deep venous thrombosis) ([place])    right  upper arm  . GERD (gastroesophageal reflux disease)   . Hypercholesteremia   . Hypertension   . Osteomyelitis of ankle ([place])   . Peripheral edema   . PVD (peripheral vascular disease) ([place])   . Renal disorder   . Spinal stenosis    mild lumbar (MRI 05/2012)-L2-L3 to L4-L5 , mild lumbar foraminal stenosis   . Stroke ([place]) 05/2012   Subacute, lacunar infarcts within the left basal ganglia and posterior limp  of the left internal capsule/thalamus; "RUE; both feet weak" (05/27/2012)  . Tremor   . Type II diabetes mellitus ([place])     Past Surgical History:  Procedure Laterality Date  . ANKLE SURGERY    . ESOPHAGOGASTRODUODENOSCOPY (EGD) WITH PROPOFOL N/A 12/21/2016   Procedure: ESOPHAGOGASTRODUODENOSCOPY (EGD) WITH PROPOFOL;  Surgeon: Daneil Dolin, MD;  [facility];  Service: Gastroenterology;  Laterality: N/A;  . ESOPHAGOGASTRODUODENOSCOPY (EGD) WITH PROPOFOL N/A 04/02/2017   Procedure: ESOPHAGOGASTRODUODENOSCOPY (EGD) WITH PROPOFOL;  Surgeon: Danie Binder, MD;  Location: [facility];  Service: Endoscopy;  Laterality: N/A;  1030   . HERNIA REPAIR  01/05/2004   "belly button" (05/27/2012)    Social History  reports that he quit smoking about 4 years ago. His smoking use included cigarettes. He has a 22.50 pack-year smoking history. He has never used smokeless tobacco. He reports that he does not drink alcohol and does not use drugs.  Allergies  Allergen Reactions  . Blueberry Flavor Other (See Comments)    Unknown  . Cucumber Extract Other (See Comments)    "Fells like I'm having a heart attack"  . Flexeril [Cyclobenzaprine Hcl] Other (See Comments)    "whole body  Tremors" (05/27/2012)  . Kiwi Extract Other (See Comments)    "feels like I'm having a heart attack"    Family History  Problem Relation Age of Onset  . Colon cancer Neg Hx    Prior to Admission medications   Medication Sig Start Date End Date Taking? Authorizing Provider  acetaminophen (TYLENOL) 325 MG tablet Take 650 mg by mouth every 6 (six) hours as needed.   Yes [provider]  albuterol (PROVENTIL) (2.5 MG/3ML) 0.083% nebulizer solution Take 2.5 mg by nebulization every 3 (three) hours as needed for wheezing or shortness of breath.   Yes [provider]  allopurinol (ZYLOPRIM) 100 MG tablet Take 1 tablet by mouth at bedtime.   Yes [provider]  ALPRAZolam (XANAX) 0.5 MG  tablet Take 1 tablet (0.5 mg total) by mouth every 12 (twelve) hours as needed for anxiety or sleep. 05/21/19  Yes Emokpae, Courage, MD  amLODipine (NORVASC) 10 MG tablet Take 1 tablet (10 mg total) by mouth daily. 05/21/19  Yes Roxan Hockey, MD  aspirin EC 81 MG tablet Take 81 mg by mouth daily.   Yes [provider]  BREO ELLIPTA 100-25 MCG/INH AEPB Inhale 2 puffs into the lungs daily.  09/27/16  Yes [provider]  calcitRIOL (ROCALTROL) 0.25 MCG capsule Take 0.25 mcg by mouth daily.   Yes [provider]  cholecalciferol (VITAMIN D) 1000 units tablet Take 3,000 Units by mouth daily.    Yes [provider]  cyanocobalamin 1000 MCG tablet Take 1,000 mcg by mouth daily.   Yes [provider]  fish oil-omega-3 fatty acids 1000 MG capsule Take 1 g by mouth 2 (two) times daily.   Yes [provider]  gabapentin (NEURONTIN) 400 MG capsule Take 400 mg by mouth every 8 (eight) hours.  Yes [provider]  HUMALOG KWIKPEN 100 UNIT/ML KiwkPen Inject 2-14 Units into the skin 3 (three) times daily. Per sliding scale 150-200= 2 units 201-250= 4 units 251-300= 6 units 301-349= 8 units 350-400=10units 401-450=12units 451-500=14units ABOVE 501- CALL MD 10/07/16  Yes [provider]  HYDROcodone-acetaminophen (NORCO/VICODIN) 5-325 MG tablet Take 1 tablet by mouth every 8 (eight) hours as needed for moderate pain. 05/21/19  Yes Emokpae, Courage, MD  insulin glargine (LANTUS) 100 UNIT/ML injection Inject 0.32 mLs (32 Units total) into the skin at bedtime. 05/21/19  Yes Emokpae, Courage, MD  magnesium oxide (MAG-OX) 400 MG tablet Take 400 mg by mouth daily.   Yes [provider]  metoprolol tartrate (LOPRESSOR) 50 MG tablet Take 1 tablet (50 mg total) by mouth 2 (two) times daily. 05/21/19  Yes Roxan Hockey, MD  Multiple Vitamin (MULTIVITAMIN) tablet Take 1 tablet by mouth daily.   Yes [provider]    pantoprazole (PROTONIX) 40 MG tablet Take 1 tablet (40 mg total) by mouth daily before breakfast. 05/21/19 11/16/19 Yes Emokpae, Courage, MD  torsemide (DEMADEX) 20 MG tablet Take 40 mg (2 tabs) in the morning and 20 mg (1 tab) in the evening) Patient taking differently: 20 mg. Give one tablet in am and two tablets in evenings 05/22/19  Yes Elnora Morrison, MD  amoxicillin-clavulanate (AUGMENTIN) 875-125 MG tablet Take 1 tablet by mouth 2 (two) times daily. One po bid x 7 days 05/22/19   Elnora Morrison, MD  atorvastatin (LIPITOR) 20 MG tablet Take 1 tablet (20 mg total) by mouth daily. 05/21/19 05/20/20  Roxan Hockey, MD  calcium carbonate (TUMS - DOSED IN MG ELEMENTAL CALCIUM) 500 MG chewable tablet Chew 2 tablets by mouth 2 (two) times daily.    [provider]  DULoxetine (CYMBALTA) 60 MG capsule Take 1 capsule (60 mg total) by mouth daily. 05/22/19   Roxan Hockey, MD  FLUoxetine (PROZAC) 10 MG tablet Take 20 mg by mouth daily.     [provider]  FLUoxetine (PROZAC) 20 MG capsule Take 20 mg by mouth daily. 05/12/19   [provider]  lidocaine (LIDODERM) 5 % Place 1 patch onto the skin 2 (two) times daily. Remove & Discard patch within 12 hours or as directed by MD. Apply to lower back twice daily.    [provider]   Physical Exam: Vitals:   11/16/19 0220 11/16/19 0223 11/16/19 0238 11/16/19 0306  BP: (!) 151/102   134/79  Pulse: 98   91  Resp: (!) 24   18  Temp: 100.2 F (37.9 C)     TempSrc: Rectal     SpO2: 100% 100% 96% 100%  Weight:      Height:       Constitutional: NAD, calm, comfortable Eyes: PERRL, lids and conjunctivae mildly injected. ENMT: BiPAP mask on.  Mucous membranes are moist. Posterior pharynx clear of any exudate or lesions. Neck: normal, supple, no masses, no thyromegaly Respiratory: On BiPAP ventilation mode.  Good air entry with mild rhonchi and wheezing bilaterally, bibasilar crackles.  No accessory muscle use.   Cardiovascular: Regular rate and rhythm, no murmurs / rubs / gallops. No extremity edema. 2+ pedal pulses. No carotid bruits.  Abdomen: Obese, nondistended.  BS positive.  Soft, no tenderness, no masses palpated. No hepatosplenomegaly. Musculoskeletal: no clubbing / cyanosis. Good ROM, no contractures. Normal muscle tone.  Skin: Multiple areas with cellulitis on lower extremities.  See pictures below. Neurologic: CN 2-12 grossly intact. Sensation intact, DTR normal.  Strength 5/5 in all 4.  Psychiatric: Normal judgment and insight. Alert and oriented x 3. Normal mood.   Left pretibial area   LLE inferior-medial area   LLE lateral area   RLE      Labs on Admission: I have personally reviewed following labs and imaging studies  CBC: Recent Labs  Lab 11/16/19 0215  WBC 16.8*  NEUTROABS 12.4*  HGB 9.5*  HCT 31.2*  MCV 89.4  PLT 235    Basic Metabolic Panel: Recent Labs  Lab 11/16/19 0215  NA 143  K 3.9  CL 101  CO2 26  GLUCOSE 252*  BUN 45*  CREATININE 3.62*  CALCIUM 9.2   GFR: Estimated Creatinine Clearance: 26 mL/min (A) (by C-G formula based on SCr of 3.62 mg/dL (H)).  Liver Function Tests: Recent Labs  Lab 11/16/19 0215  AST 13*  ALT 8  ALKPHOS 89  BILITOT 0.8  PROT 8.7*  ALBUMIN 3.9   Radiological Exams on Admission: DG Chest Port 1 View  Result Date: 11/16/2019 CLINICAL DATA:  Shortness of breath EXAM: PORTABLE CHEST 1 VIEW COMPARISON:  05/22/2019 FINDINGS: Cardiomegaly, vascular congestion. Diffuse interstitial prominence could reflect interstitial edema. No confluent opacities or effusions. No acute bony abnormality. IMPRESSION: Cardiomegaly with vascular congestion and probable early interstitial edema. Electronically Signed   By: Rolm Baptise M.D.   On: 11/16/2019 02:46   05/08/2019 echocardiogram complete.  IMPRESSIONS   1. Left ventricular ejection fraction, by visual estimation, is 60 to  65%. The left ventricle has normal function.  There is severely increased  left ventricular hypertrophy.  2. Left ventricular diastolic parameters are indeterminate.  3. Global right ventricle has normal systolic function.The right  ventricular size is normal. No increase in right ventricular wall  thickness.  4. Left atrial size was normal.  5. Right atrial size was normal.  6. The mitral valve is normal in structure. Trace mitral valve  regurgitation. No evidence of mitral stenosis.  7. The tricuspid valve is normal in structure. Tricuspid valve  regurgitation is not demonstrated.  8. The aortic valve is tricuspid. Aortic valve regurgitation is mild. No  evidence of aortic valve sclerosis or stenosis.  9. The pulmonic valve was not well visualized. Pulmonic valve  regurgitation is not visualized.  10. The inferior vena cava is normal in size with greater than 50%  respiratory variability, suggesting right atrial pressure of 3 mmHg.   EKG: Independently reviewed.  Vent. rate 97 BPM PR interval * ms QRS duration 124 ms QT/QTc 392/498 ms P-R-T axes * 11 61 16-Nov-2019 02:19:14 [facility] ROUTINE RECORD Undetermined rhythm IVCD, consider atypical LBBB Artifact in lead(s) I II III aVR aVL aVF V1 and baseline wander in lead(s) I III aVL  Assessment/Plan Principal Problem:   Acute respiratory failure with hypoxia ([name]) Multifactorial (COPD, diastolic CHF and questionable CAP). Observation/stepdown. Continue supplemental oxygen. Continue ventilation support with BiPAP. Bronchodilators as needed. CHF and COPD treatment as below.  Active Problems:   Acute on chronic diastolic CHF (congestive heart failure) ([name]) Continue supplemental oxygen with BiPAP ventilation. Continue furosemide 60 mg IVP twice daily. Continue Nitropaste to anterior chest wall. Fluid and sodium restriction. Follow-up daily weights. Monitor intake and output. Follow-up renal function and electrolytes.    COPD with acute  exacerbation ([place]) Received a single dose of Solu-Medrol. Defer further glucocorticoids for now. Bronchodilators as needed.    CAP (community acquired pneumonia)?  Mildly febrile initially Barely positive procalcitonin. Mild cellulitis on LLE. Continue antibiotics  for now. Repeat chest radiograph and pro-calcitonin in 24 hours.    Cellulitis of both lower extremities Currently on ceftriaxone. Continue Local Care.    Type 2 diabetes with nephropathy ([name]) Carbohydrate modified diet. Continue L[place]-acting insulin 38 units nightly. CBG monitoring with RI SS. Check hemoglobin A1c. Cautious glucocorticoid use.    HTN (hypertension) Continue amlodipine 10 mg p.o. daily. Hold metoprolol until the patient is less decompensated. On furosemide 60 mg IVP twice daily. Monitor blood pressure, heart rate, renal function electrolytes.    Diabetic polyneuropathy associated with type 2 diabetes mellitus ([name]) Continue gabapentin 400 mg p.o. 3 times daily. Continue as needed hydrocodone for neuropathy and spinal stenosis/CBP.    GERD (gastroesophageal reflux disease) Continue pantoprazole 40 mg p.o. daily.    Normocytic anemia Secondary to renal disease. Monitor hemoglobin level.     DVT prophylaxis: SCDs. Code Status:   Full code. Family Communication: Disposition Plan:   Patient is from:  [facility].  Anticipated DC to:  [facility].  Anticipated DC date:  11/16/2019.  Anticipated DC barriers: Clinical improvement.  Consults called: Admission status:  Observation/stepdown.    Severity of Illness: High to very high severity.  Reubin Milan MD [facility]  How to contact the [facility] Attending or Consulting provider [place] or covering provider during after hours [place], for this patient?   1. Check the care team in [facility] and look for a) attending/consulting [facility] provider listed and b) the [facility] team listed 2. Log into [email or web address] and use [facility]'s universal password to access. If you do not have the password, please contact the hospital operator. 3. Locate the [facility] provider you are looking for under [facility] and page to a number that you can be directly reached. 4. If you still have difficulty reaching the provider, please page the [facility] (Director on Call) for the Hospitalists listed on amion for assistance.  11/16/2019, 4:20 AM   This document was prepared using Dragon voice recognition software and may contain some unintended transcription errors.

## 2019-11-16 NOTE — ED Provider Notes (Signed)
[facility] EMERGENCY DEPARTMENT Provider Note   CSN: [id number] Arrival date & time: 11/16/19  4765   Time seen 2:13 AM History Chief Complaint  Patient presents with  . Respiratory Distress   Level 5 caveat for respiratory distress  Eric Scott is a 69 y.o. male.  HPI   Per EMS they were called by patient's nursing home for shortness of breath tonight.  They report on 3 L his pulse ox was in the 70s, the nursing facility increased his oxygen to 5 L and it only improved into the 80s.  EMS reports when they put him on their stretcher his pulse ox dropped into the 70s again and he became less responsive.  They put him on a nonrebreather mask and it improved to 91%.  Patient has a history of COPD and congestive heart failure.  They were told patient had had his Covid vaccines.  Patient is awake however he is not able to answer questions.  PCP [name], MD   Past Medical History:  Diagnosis Date  . Anemia   . Anxiety   . Chronic pain    legs, back; MRI 05/2012 with mild thoracic degenerative changes no spinal stenosis   . 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 COPD (chronic obstructive pulmonary disease) ([place])   . Depression   . Diabetic peripheral neuropathy ([place])    "chronic" (05/27/2012)  . Diastolic CHF ([place])   . Duodenal ulcer hemorrhage   . DVT (deep venous thrombosis) ([name])    right upper arm  . GERD (gastroesophageal reflux disease)   . Hypercholesteremia   . Hypertension   . 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 Peripheral edema   . PVD (peripheral vascular disease) ([place])   . Renal disorder   . Spinal stenosis    mild lumbar (MRI 05/2012)-L2-L3 to L4-L5 , mild lumbar foraminal stenosis   . Stroke ([place]) 05/2012   Subacute, lacunar infarcts within the left basal ganglia and posterior limp of the left internal capsule/thalamus; "RUE; both feet weak" (05/27/2012)  . Tremor   . Type II diabetes mellitus [facility])     Patient Active Problem List    Diagnosis Date Noted  . Normocytic anemia 11/16/2019  . Chronic respiratory failure with hypoxia ([place]) 05/22/2019  . Leukocytosis 05/22/2019  . Chronic heart failure with preserved ejection fraction (HFpEF) ([place]) 05/22/2019  . Acute respiratory failure ([place]) 05/17/2019  . Obesity, Class III, BMI 40-49.9 (morbid obesity) ([place]) 05/17/2019  . Atrial flutter, paroxysmal ([place]) 05/17/2019  . COPD with acute exacerbation ([place]) 04/20/2019  . GERD (gastroesophageal reflux disease) 08/08/2018  . PUD (peptic ulcer disease)   . Gastric erosions 02/13/2017  . Duodenal ulcer 02/13/2017  . Transaminitis   . UGI bleed   . Arm DVT (deep venous thromboembolism), acute, right ([place]) 12/18/2016  . Uremia 12/10/2016  . Acute renal failure ([place])   . Volume depletion 11/23/2016  . Hypotension   . Liver enzyme elevation   . 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 Arterial insufficiency of lower extremity ([place]) 11/15/2016  . Pressure injury of skin 11/09/2016  . Sepsis ([place]) 11/09/2016  . Osteomyelitis ([place]) 11/09/2016  . Wounds, multiple 11/08/2016  . Chronic venous insufficiency 10/12/2016  . Critical lower limb ischemia 10/12/2016  . Fatty liver 09/10/2016  . History of colonic polyps 09/10/2016  . Gallstone 09/10/2016  . Edema 03/04/2015  . AKI (acute kidney injury) ([place])  03/04/2015  . Fall 03/04/2015  . Generalized weakness 03/04/2015  . Diabetic polyneuropathy associated with type 2 diabetes mellitus ([place])   . Anxiety   . PVD (peripheral vascular disease) ([place])   . Pneumonia 07/20/2014  . Weight gain 05/02/2014  . Coarse tremors 04/18/2014  . Anxiety state 04/18/2014  . Acute respiratory failure with hypoxia ([place]) 04/07/2014  . Acute on chronic diastolic CHF (congestive heart failure) ([place]) 04/07/2014  . Solitary pulmonary nodule 04/07/2014  . COPD with exacerbation ([place]) 04/02/2014  . Hypoxia 04/02/2014  . Cerebral  infarction ([name]) 06/01/2012  . Chronic pain (back, legs) 05/30/2012  . Toe laceration, 4th toe 05/30/2012  . C. difficile diarrhea 05/30/2012  . Hypokalemia 05/30/2012  . Acute lacunar stroke ([place]) 05/27/2012  . Type 2 diabetes with nephropathy ([place]) 05/27/2012  . Smoker 05/27/2012  . HTN (hypertension) 05/27/2012    Past Surgical History:  Procedure Laterality Date  . ANKLE SURGERY    . ESOPHAGOGASTRODUODENOSCOPY (EGD) WITH PROPOFOL N/A 12/21/2016   Procedure: ESOPHAGOGASTRODUODENOSCOPY (EGD) WITH PROPOFOL;  Surgeon: Daneil Dolin, MD;  Location: [facility] ENDO SUITE;  Service: Gastroenterology;  Laterality: N/A;  . ESOPHAGOGASTRODUODENOSCOPY (EGD) WITH PROPOFOL N/A 04/02/2017   Procedure: ESOPHAGOGASTRODUODENOSCOPY (EGD) WITH PROPOFOL;  Surgeon: Danie Binder, MD;  Location: [facility] ENDO SUITE;  Service: Endoscopy;  Laterality: N/A;  1030   . HERNIA REPAIR  01/05/2004   "belly button" (05/27/2012)       Family History  Problem Relation Age of Onset  . Colon cancer Neg Hx     Social History   Tobacco Use  . Smoking status: Former Smoker    Packs/day: 0.50    Years: 45.00    Pack years: 22.50    Types: Cigarettes    Quit date: 12/28/2014    Years since quitting: 4.8  . Smokeless tobacco: Never Used  . Tobacco comment: 1/2 pk per day  Vaping Use  . Vaping Use: Never used  Substance Use Topics  . Alcohol use: No    Comment: 05/27/2012 "drank gallons and gallons 20 yr ago or so; last drink  at least 10 yr ago"  . Drug use: No  Lives in a nursing home  Home Medications Prior to Admission medications   Medication Sig Start Date End Date Taking? Authorizing Provider  acetaminophen (TYLENOL) 325 MG tablet Take 650 mg by mouth every 6 (six) hours as needed.   Yes [provider]  albuterol (PROVENTIL) (2.5 MG/3ML) 0.083% nebulizer solution Take 2.5 mg by nebulization every 3 (three) hours as needed for wheezing or shortness of breath.   Yes [provider]    allopurinol (ZYLOPRIM) 100 MG tablet Take 1 tablet by mouth at bedtime.   Yes [provider]  ALPRAZolam (XANAX) 0.5 MG tablet Take 1 tablet (0.5 mg total) by mouth every 12 (twelve) hours as needed for anxiety or sleep. 05/21/19  Yes Emokpae, Courage, MD  amLODipine (NORVASC) 10 MG tablet Take 1 tablet (10 mg total) by mouth daily. 05/21/19  Yes Roxan Hockey, MD  aspirin EC 81 MG tablet Take 81 mg by mouth daily.   Yes [provider]  BREO ELLIPTA 100-25 MCG/INH AEPB Inhale 2 puffs into the lungs daily.  09/27/16  Yes [provider]  calcitRIOL (ROCALTROL) 0.25 MCG capsule Take 0.25 mcg by mouth daily.   Yes [provider]  cholecalciferol (VITAMIN D) 1000 units tablet Take 3,000 Units by mouth daily.    Yes [provider]  cyanocobalamin 1000 MCG  tablet Take 1,000 mcg by mouth daily.   Yes [provider]  fish oil-omega-3 fatty acids 1000 MG capsule Take 1 g by mouth 2 (two) times daily.   Yes [provider]  gabapentin (NEURONTIN) 400 MG capsule Take 400 mg by mouth every 8 (eight) hours.    Yes [provider]  HUMALOG KWIKPEN 100 UNIT/ML KiwkPen Inject 2-14 Units into the skin 3 (three) times daily. Per sliding scale 150-200= 2 units 201-250= 4 units 251-300= 6 units 301-349= 8 units 350-400=10units 401-450=12units 451-500=14units ABOVE 501- CALL MD 10/07/16  Yes [provider]  HYDROcodone-acetaminophen (NORCO/VICODIN) 5-325 MG tablet Take 1 tablet by mouth every 8 (eight) hours as needed for moderate pain. 05/21/19  Yes Emokpae, Courage, MD  insulin glargine (LANTUS) 100 UNIT/ML injection Inject 0.32 mLs (32 Units total) into the skin at bedtime. 05/21/19  Yes Emokpae, Courage, MD  magnesium oxide (MAG-OX) 400 MG tablet Take 400 mg by mouth daily.   Yes [provider]  metoprolol tartrate (LOPRESSOR) 50 MG tablet Take 1 tablet (50 mg total) by mouth 2 (two) times daily. 05/21/19  Yes  Roxan Hockey, MD  Multiple Vitamin (MULTIVITAMIN) tablet Take 1 tablet by mouth daily.   Yes [provider]  pantoprazole (PROTONIX) 40 MG tablet Take 1 tablet (40 mg total) by mouth daily before breakfast. 05/21/19 11/16/19 Yes Emokpae, Courage, MD  torsemide (DEMADEX) 20 MG tablet Take 40 mg (2 tabs) in the morning and 20 mg (1 tab) in the evening) Patient taking differently: 20 mg. Give one tablet in am and two tablets in evenings 05/22/19  Yes Elnora Morrison, MD  amoxicillin-clavulanate (AUGMENTIN) 875-125 MG tablet Take 1 tablet by mouth 2 (two) times daily. One po bid x 7 days 05/22/19   Elnora Morrison, MD  atorvastatin (LIPITOR) 20 MG tablet Take 1 tablet (20 mg total) by mouth daily. 05/21/19 05/20/20  Roxan Hockey, MD  calcium carbonate (TUMS - DOSED IN MG ELEMENTAL CALCIUM) 500 MG chewable tablet Chew 2 tablets by mouth 2 (two) times daily.    [provider]  DULoxetine (CYMBALTA) 60 MG capsule Take 1 capsule (60 mg total) by mouth daily. 05/22/19   Roxan Hockey, MD  FLUoxetine (PROZAC) 10 MG tablet Take 20 mg by mouth daily.     [provider]  FLUoxetine (PROZAC) 20 MG capsule Take 20 mg by mouth daily. 05/12/19   [provider]  lidocaine (LIDODERM) 5 % Place 1 patch onto the skin 2 (two) times daily. Remove & Discard patch within 12 hours or as directed by MD. Apply to lower back twice daily.    [provider]    Allergies    Blueberry flavor, Cucumber extract, Flexeril [cyclobenzaprine hcl], and Kiwi extract  Review of Systems   Review of Systems  All other systems reviewed and are negative.   Physical Exam Updated Vital Signs BP 134/79   Pulse 91   Temp 100.2 F (37.9 C) (Rectal)   Resp 18   Ht 6' (1.829 m)   Wt 122 kg   SpO2 100%   BMI 36.48 kg/m   Physical Exam Vitals and nursing note reviewed.  Constitutional:      General: He is in acute distress.     Appearance: He is obese.  HENT:     Head:  Normocephalic and atraumatic.     Right Ear: External ear normal.     Left Ear: External ear normal.  Eyes:     Extraocular Movements:  Extraocular movements intact.     Conjunctiva/sclera: Conjunctivae normal.     Pupils: Pupils are equal, round, and reactive to light.  Cardiovascular:     Rate and Rhythm: Normal rate and regular rhythm.  Pulmonary:     Effort: Tachypnea, accessory muscle usage, prolonged expiration and respiratory distress present.     Breath sounds: Decreased air movement present. Wheezing present.  Abdominal:     General: There is distension.     Palpations: Abdomen is soft.  Musculoskeletal:     Cervical back: Normal range of motion.  Skin:    General: Skin is warm and dry.     Comments: Patient has some superficial ulcers on his anterior mid left lower leg that has a dressing on it.  He has some scabbed areas on his anterior mid right shin.  He appears to have some mild edema of his lower extremities. Patient's face is flushed.  He has clammy skin and feels warm to touch.  Neurological:     Mental Status: He is alert.     ED Results / Procedures / Treatments   Labs (all labs ordered are listed, but only abnormal results are displayed) Results for orders placed or performed during the hospital encounter of 11/16/19  SARS Coronavirus 2 by RT PCR (hospital order, performed in [place] hospital lab) Nasopharyngeal Nasopharyngeal Swab   Specimen: Nasopharyngeal Swab  Result Value Ref Range   SARS Coronavirus 2 NEGATIVE NEGATIVE  Comprehensive metabolic panel  Result Value Ref Range   Sodium 143 135 - 145 mmol/L   Potassium 3.9 3.5 - 5.1 mmol/L   Chloride 101 98 - 111 mmol/L   CO2 26 22 - 32 mmol/L   Glucose, Bld 252 (H) 70 - 99 mg/dL   BUN 45 (H) 8 - 23 mg/dL   Creatinine, Ser 3.62 (H) 0.61 - 1.24 mg/dL   Calcium 9.2 8.9 - 10.3 mg/dL   Total Protein 8.7 (H) 6.5 - 8.1 g/dL   Albumin 3.9 3.5 - 5.0 g/dL   AST 13 (L) 15 - 41 U/L   ALT 8 0 - 44 U/L    Alkaline Phosphatase 89 38 - 126 U/L   Total Bilirubin 0.8 0.3 - 1.2 mg/dL   GFR calc non Af Amer 16 (L) >60 mL/min   GFR calc Af Amer 19 (L) >60 mL/min   Anion gap 16 (H) 5 - 15  CBC with Differential  Result Value Ref Range   WBC 16.8 (H) 4.0 - 10.5 K/uL   RBC 3.49 (L) 4.22 - 5.81 MIL/uL   Hemoglobin 9.5 (L) 13.0 - 17.0 g/dL   HCT 31.2 (L) 39 - 52 %   MCV 89.4 80.0 - 100.0 fL   MCH 27.2 26.0 - 34.0 pg   MCHC 30.4 30.0 - 36.0 g/dL   RDW 17.2 (H) 11.5 - 15.5 %   Platelets 248 150 - 400 K/uL   nRBC 0.2 0.0 - 0.2 %   Neutrophils Relative % 75 %   Neutro Abs 12.4 (H) 1.7 - 7.7 K/uL   Lymphocytes Relative 14 %   Lymphs Abs 2.4 0.7 - 4.0 K/uL   Monocytes Relative 6 %   Monocytes Absolute 1.0 0 - 1 K/uL   Eosinophils Relative 4 %   Eosinophils Absolute 0.7 (H) 0 - 0 K/uL   Basophils Relative 0 %   Basophils Absolute 0.1 0 - 0 K/uL   Immature Granulocytes 1 %   Abs Immature Granulocytes 0.18 (H) 0.00 - 0.07  K/uL  Brain natriuretic peptide  Result Value Ref Range   B Natriuretic Peptide 386.0 (H) 0.0 - 100.0 pg/mL  Blood gas, venous  Result Value Ref Range   FIO2 45.00    pH, Ven 7.377 7.25 - 7.43   pCO2, Ven 48.7 44 - 60 mmHg   pO2, Ven 57.9 (H) 32 - 45 mmHg   Bicarbonate 26.8 20.0 - 28.0 mmol/L   Acid-Base Excess 3.2 (H) 0.0 - 2.0 mmol/L   O2 Saturation 87.7 %   Patient temperature 39.0   Troponin I (High Sensitivity)  Result Value Ref Range   Troponin I (High Sensitivity) 10 <18 ng/L   Laboratory interpretation all normal except except leukocytosis, anemia (his hemoglobin was 8.7 on May 22, 2019 and 8.7 on May 20, 2019), stable renal insufficiency, minimal elevation of BNP, hyperglycemia    EKG EKG Interpretation  Date/Time:  Monday November 16 2019 02:19:14 EDT Ventricular Rate:  97 PR Interval:    QRS Duration: 124 QT Interval:  392 QTC Calculation: 498 R Axis:   11 Text Interpretation: Undetermined rhythm IVCD, consider atypical LBBB Artifact in lead(s)  I II III aVR aVL aVF V1 and baseline wander in lead(s) I III aVL Confirmed by Rolland Porter [phone]) on 11/16/2019 2:24:44 AM   Radiology DG Chest Port 1 View  Result Date: 11/16/2019 CLINICAL DATA:  Shortness of breath EXAM: PORTABLE CHEST 1 VIEW COMPARISON:  05/22/2019 FINDINGS: Cardiomegaly, vascular congestion. Diffuse interstitial prominence could reflect interstitial edema. No confluent opacities or effusions. No acute bony abnormality. IMPRESSION: Cardiomegaly with vascular congestion and probable early interstitial edema. Electronically Signed   By: Rolm Baptise M.D.   On: 11/16/2019 02:46    Procedures .Critical Care Performed by: Rolland Porter, MD Authorized by: Rolland Porter, MD   Critical care provider statement:    Critical care time (minutes):  40   Critical care was necessary to treat or prevent imminent or life-threatening deterioration of the following conditions:  Respiratory failure   Critical care was time spent personally by me on the following activities:  Discussions with consultants, examination of patient, evaluation of patient's response to treatment, obtaining history from patient or surrogate, ordering and review of laboratory studies, ordering and review of radiographic studies, pulse oximetry, re-evaluation of patient's condition and review of old charts   (including critical care time)  Medications Ordered in ED Medications  azithromycin (ZITHROMAX) 500 mg in sodium chloride 0.9 % 250 mL IVPB (500 mg Intravenous New Bag/Given (Non-Interop) 11/16/19 0324)  furosemide (LASIX) injection 60 mg (60 mg Intravenous Given 11/16/19 0236)  nitroGLYCERIN (NITROGLYN) 2 % ointment 1 inch (1 inch Topical Given 11/16/19 0237)  ipratropium (ATROVENT) 0.02 % nebulizer solution (0.5 mg  Given 11/16/19 0238)  albuterol (VENTOLIN) (5 MG/ML) 0.5% continuous inhalation solution (10 mg Nebulization Given 11/16/19 0237)  cefTRIAXone (ROCEPHIN) 1 g in sodium chloride 0.9 % 100 mL IVPB (1 g  Intravenous New Bag/Given (Non-Interop) 11/16/19 0324)  methylPREDNISolone sodium succinate (SOLU-MEDROL) 125 mg/2 mL injection 125 mg (125 mg Intravenous Given 11/16/19 0324)    ED Course  I have reviewed the triage vital signs and the nursing notes.  Pertinent labs & imaging results that were available during my care of the patient were reviewed by me and considered in my medical decision making (see chart for details).    MDM Rules/Calculators/A&P                         Recheck at  2:20 AM patient is on BiPAP.  I have talked to respiratory therapist and she is willing to start him on albuterol and Atrovent which was ordered.  His rectal temperature was 100.2.  Currently he is on 45% FiO2 with a pulse ox at 98%.  His respiratory rate has come down from 31-22.  Blood pressure is 151/102, Lasix 60 mg IV and nitroglycerin 1 inch was ordered.  Patient's last admission to the hospital was December 12 through 17, 2020.  Due to his fever and he has respiratory complaints he was started on community-acquired pneumonia antibiotics.  Recheck at 3:40 AM patient is 100% on his 45% BiPAP.  His respiratory rate is down to 17.  When I listen to his lungs I do not hear the wheezing now.  His blood pressure still 155/57 with heart rate 87.  Patient indicates he is feeling better.  Labs in the room drawing his second set of blood cultures and his delta troponin.  They are also getting his VBG.  Patient states he does not feel the urge to urinate yet after the Lasix.  At the time I ordered his Lasix I was not aware of his renal insufficiency.  Review of patient's VBG shows he has a respiratory acidosis present but has a PCO2 similar to what he had in December.  I am going to talk to the hospitalist about admission.  4:12 AM Dr. Olevia Bowens, hospitalist will admit  Final Clinical Impression(s) / ED Diagnoses Final diagnoses:  COPD exacerbation ([place])  Acute congestive heart failure, unspecified heart failure type  ([place])  Acute respiratory failure with hypoxia and hypercapnia ([place])    Rx / DC Orders Plan admission  Rolland Porter, MD, Barbette Or, MD 11/16/19 [phone]

## 2019-11-16 NOTE — ED Triage Notes (Signed)
Pt arrived to er from [facility] with c/o sob that started about a hour ago, pulse ox 82% on 5 liter per nursing home, pt changed to NRB by ems with increase of pulse ox to 92%, pt alert, diaphoretic, c/o chest pain,.

## 2019-11-16 NOTE — Progress Notes (Signed)
Patient still very drowsy, unable to stay awake longer than a few minutes though easily rousable. BP also elevated at 172/71. Kept on bipap through morning and notified Dr. Joesph Fillers for further orders. Will continue to monitor.

## 2019-11-16 NOTE — Progress Notes (Signed)
Patient seen and evaluated, chart reviewed, please see EMR for updated orders. Please see full H&P dictated by admitting physician Dr. Olevia Bowens for same date of service.    - Brief Summary 69 y.o. male with medical history significant of normocytic anemia renal disease, anxiety, depression, spinal stenosis, diabetic peripheral neuropathy, chronic pain syndrome, stage V CKD, COPD, history of UGI due to duodenal ulcer, history of right upper extremity DVT, hyperlipidemia, hypertension, osteomyelitis of the ankle, peripheral edema, peripheral vascular disease, history of subcu left basal ganglia other nonhemorrhagic lacunar stroke, basal tremor, type 2 diabetes mellitus, diastolic CHF with last EF in December 2020 of 60 to 65% admitted on 11/16/2019 with acute metabolic encephalopathy with presumed hypoxia and hypercapnia   -Patient evaluated several times throughout the day in ICU, becoming more more awake with continuous BiPAP, will try liquid diet for now  -BP meds adjusted   Patient seen and evaluated, chart reviewed, please see EMR for updated orders. Please see full H&P dictated by admitting physician Dr. Olevia Bowens for same date of service.

## 2019-11-16 NOTE — Progress Notes (Signed)
OT Cancellation Note  Patient Details Name: Eric Scott MRN: [id number] DOB: [birth year]   Cancelled Treatment:    Reason Eval/Treat Not Completed: Medical issues which prohibited therapy. Pt on Bipap and very lethargic. Pt RR 11-15. Notified RN; RT called. Will hold and return as schedule allows. Thank you.  [place], OTR/L Acute Rehab Pager: [phone] Office: [phone] 11/16/2019, 10:50 AM

## 2019-11-16 NOTE — Progress Notes (Signed)
Placed patient back on Bipap at this time due to him being very sleepy. RN called me to assess. I agreed to place him back on. He is resting comfortably at this time.

## 2019-11-17 ENCOUNTER — Observation Stay (HOSPITAL_COMMUNITY): Payer: Medicare (Managed Care)

## 2019-11-17 DIAGNOSIS — E1122 Type 2 diabetes mellitus with diabetic chronic kidney disease: Secondary | ICD-10-CM | POA: Diagnosis present

## 2019-11-17 DIAGNOSIS — E1151 Type 2 diabetes mellitus with diabetic peripheral angiopathy without gangrene: Secondary | ICD-10-CM | POA: Diagnosis present

## 2019-11-17 DIAGNOSIS — L03115 Cellulitis of right lower limb: Secondary | ICD-10-CM | POA: Diagnosis present

## 2019-11-17 DIAGNOSIS — K219 Gastro-esophageal reflux disease without esophagitis: Secondary | ICD-10-CM | POA: Diagnosis present

## 2019-11-17 DIAGNOSIS — I5033 Acute on chronic diastolic (congestive) heart failure: Secondary | ICD-10-CM | POA: Diagnosis present

## 2019-11-17 DIAGNOSIS — Z20822 Contact with and (suspected) exposure to covid-19: Secondary | ICD-10-CM | POA: Diagnosis present

## 2019-11-17 DIAGNOSIS — E1165 Type 2 diabetes mellitus with hyperglycemia: Secondary | ICD-10-CM | POA: Diagnosis present

## 2019-11-17 DIAGNOSIS — J440 Chronic obstructive pulmonary disease with acute lower respiratory infection: Secondary | ICD-10-CM | POA: Diagnosis present

## 2019-11-17 DIAGNOSIS — J441 Chronic obstructive pulmonary disease with (acute) exacerbation: Secondary | ICD-10-CM | POA: Diagnosis present

## 2019-11-17 DIAGNOSIS — Z6841 Body Mass Index (BMI) 40.0 and over, adult: Secondary | ICD-10-CM | POA: Diagnosis not present

## 2019-11-17 DIAGNOSIS — J9601 Acute respiratory failure with hypoxia: Secondary | ICD-10-CM | POA: Diagnosis not present

## 2019-11-17 DIAGNOSIS — E7800 Pure hypercholesterolemia, unspecified: Secondary | ICD-10-CM | POA: Diagnosis present

## 2019-11-17 DIAGNOSIS — M869 Osteomyelitis, unspecified: Secondary | ICD-10-CM | POA: Diagnosis present

## 2019-11-17 DIAGNOSIS — F329 Major depressive disorder, single episode, unspecified: Secondary | ICD-10-CM | POA: Diagnosis present

## 2019-11-17 DIAGNOSIS — L03116 Cellulitis of left lower limb: Secondary | ICD-10-CM | POA: Diagnosis present

## 2019-11-17 DIAGNOSIS — J9691 Respiratory failure, unspecified with hypoxia: Secondary | ICD-10-CM | POA: Diagnosis present

## 2019-11-17 DIAGNOSIS — Z8673 Personal history of transient ischemic attack (TIA), and cerebral infarction without residual deficits: Secondary | ICD-10-CM | POA: Diagnosis not present

## 2019-11-17 DIAGNOSIS — I132 Hypertensive heart and chronic kidney disease with heart failure and with stage 5 chronic kidney disease, or end stage renal disease: Secondary | ICD-10-CM | POA: Diagnosis present

## 2019-11-17 DIAGNOSIS — E1142 Type 2 diabetes mellitus with diabetic polyneuropathy: Secondary | ICD-10-CM | POA: Diagnosis present

## 2019-11-17 DIAGNOSIS — N185 Chronic kidney disease, stage 5: Secondary | ICD-10-CM | POA: Diagnosis present

## 2019-11-17 DIAGNOSIS — J189 Pneumonia, unspecified organism: Secondary | ICD-10-CM | POA: Diagnosis present

## 2019-11-17 DIAGNOSIS — G9341 Metabolic encephalopathy: Secondary | ICD-10-CM | POA: Diagnosis present

## 2019-11-17 DIAGNOSIS — J9621 Acute and chronic respiratory failure with hypoxia: Secondary | ICD-10-CM | POA: Diagnosis present

## 2019-11-17 DIAGNOSIS — J9622 Acute and chronic respiratory failure with hypercapnia: Secondary | ICD-10-CM | POA: Diagnosis present

## 2019-11-17 DIAGNOSIS — F419 Anxiety disorder, unspecified: Secondary | ICD-10-CM | POA: Diagnosis present

## 2019-11-17 DIAGNOSIS — Z888 Allergy status to other drugs, medicaments and biological substances status: Secondary | ICD-10-CM | POA: Diagnosis not present

## 2019-11-17 LAB — BASIC METABOLIC PANEL
Anion gap: 10 (ref 5–15)
BUN: 58 mg/dL — ABNORMAL HIGH (ref 8–23)
CO2: 25 mmol/L (ref 22–32)
Calcium: 8.5 mg/dL — ABNORMAL LOW (ref 8.9–10.3)
Chloride: 106 mmol/L (ref 98–111)
Creatinine, Ser: 3.68 mg/dL — ABNORMAL HIGH (ref 0.61–1.24)
GFR calc Af Amer: 18 mL/min — ABNORMAL LOW (ref >60)
GFR calc non Af Amer: 16 mL/min — ABNORMAL LOW (ref >60)
Glucose, Bld: 209 mg/dL — ABNORMAL HIGH (ref 70–99)
Potassium: 4.4 mmol/L (ref 3.5–5.1)
Sodium: 141 mmol/L (ref 135–145)

## 2019-11-17 LAB — BLOOD GAS, ARTERIAL
Acid-Base Excess: 4.2 mmol/L — ABNORMAL HIGH (ref 0.0–2.0)
Bicarbonate: 28.1 mmol/L — ABNORMAL HIGH (ref 20.0–28.0)
Delivery systems: POSITIVE
Drawn by: 41977
Expiratory PAP: 5
FIO2: 40
Inspiratory PAP: 15
O2 Saturation: 95.6 %
Patient temperature: 37
RATE: 15 resp/min
pCO2 arterial: 44.9 mmHg (ref 32.0–48.0)
pH, Arterial: 7.418 (ref 7.350–7.450)
pO2, Arterial: 75.6 mmHg — ABNORMAL LOW (ref 83.0–108.0)

## 2019-11-17 LAB — CBC
HCT: 24.2 % — ABNORMAL LOW (ref 39.0–52.0)
Hemoglobin: 7.4 g/dL — ABNORMAL LOW (ref 13.0–17.0)
MCH: 27.2 pg (ref 26.0–34.0)
MCHC: 30.6 g/dL (ref 30.0–36.0)
MCV: 89 fL (ref 80.0–100.0)
Platelets: 178 10*3/uL (ref 150–400)
RBC: 2.72 MIL/uL — ABNORMAL LOW (ref 4.22–5.81)
RDW: 16.9 % — ABNORMAL HIGH (ref 11.5–15.5)
WBC: 7.7 10*3/uL (ref 4.0–10.5)
nRBC: 0 % (ref 0.0–0.2)

## 2019-11-17 LAB — GLUCOSE, CAPILLARY
Glucose-Capillary: 197 mg/dL — ABNORMAL HIGH (ref 70–99)
Glucose-Capillary: 196 mg/dL — ABNORMAL HIGH (ref 70–99)
Glucose-Capillary: 182 mg/dL — ABNORMAL HIGH (ref 70–99)
Glucose-Capillary: 235 mg/dL — ABNORMAL HIGH (ref 70–99)

## 2019-11-17 LAB — AMMONIA: Ammonia: 48 umol/L — ABNORMAL HIGH (ref 9–35)

## 2019-11-17 LAB — PROCALCITONIN: Procalcitonin: 0.47 ng/mL

## 2019-11-17 MED ORDER — LACTULOSE 10 GM/15ML PO SOLN
30.0000 g | Freq: Once | ORAL | Status: AC
  Administered 2019-11-17: 30 g via ORAL
  Filled 2019-11-17: qty 60

## 2019-11-17 MED ORDER — GLUCERNA SHAKE PO LIQD
237.0000 mL | Freq: Three times a day (TID) | ORAL | Status: DC
  Administered 2019-11-17 – 2019-11-18 (×3): 237 mL via ORAL

## 2019-11-17 MED ORDER — HEPARIN SODIUM (PORCINE) 5000 UNIT/ML IJ SOLN
5000.0000 [IU] | Freq: Three times a day (TID) | INTRAMUSCULAR | Status: DC
  Administered 2019-11-17 – 2019-11-18 (×2): 5000 [IU] via SUBCUTANEOUS
  Filled 2019-11-17 (×2): qty 1

## 2019-11-17 NOTE — TOC Initial Note (Addendum)
Transition of Care [facility]) - Initial/Assessment Note    Patient Details  Name: Eric Scott MRN: [id number] Date of Birth: 05-21-1951  Transition of Care [facility]) CM/SW Contact:    Natasha Bence, LCSW Phone Number: 11/17/2019, 2:23 PM  Clinical Narrative:                 Patient is a 69 year old male admitted for acute respiratory failure. The Patient reported that he would consider PT or SNF if referred but would like to discuss options at a later time.  CHF consult received by CSW. TOC completed consult with the patient's son. The patient's son reported that he does not follow a heart healthy diet and regularly experience negative affects from is sodium and sugar intake. Patient's son did not believe that the patient follows heart healthy practices such as regularly weighing himself and reducing fluid intake. TOC to follow.  Expected Discharge Plan: [place]     Expected Discharge Plan and Services Expected Discharge Plan: [facility] In-house Referral: Clinical Social Work      Prior Living Arrangements/Services   Lives with:: Facility Resident Patient language and need for interpreter reviewed:: Yes Do you feel safe going back to the place where you live?: Yes      Need for Family Participation in Patient Care: Yes (Comment)   Current home services: DME Criminal Activity/Legal Involvement Pertinent to Current Situation/Hospitalization: No - Comment as needed  Activities of Daily Living Home Assistive Devices/Equipment: Eyeglasses, Oxygen ADL Screening (condition at time of admission) Patient's cognitive ability adequate to safely complete daily activities?: Yes Is the patient deaf or have difficulty hearing?: No Does the patient have difficulty seeing, even when wearing glasses/contacts?: No Does the patient have difficulty concentrating, remembering, or making decisions?: No Patient able to express need for assistance with ADLs?: Yes Does the  patient have difficulty dressing or bathing?: Yes Independently performs ADLs?: No Communication: Independent Dressing (OT): Needs assistance Is this a change from baseline?: Pre-admission baseline Grooming: Needs assistance Is this a change from baseline?: Pre-admission baseline Feeding: Independent Bathing: Needs assistance Is this a change from baseline?: Pre-admission baseline Toileting: Needs assistance Is this a change from baseline?: Pre-admission baseline In/Out Bed: Needs assistance Is this a change from baseline?: Pre-admission baseline Walks in Home: Dependent Is this a change from baseline?: Pre-admission baseline Does the patient have difficulty walking or climbing stairs?: Yes Weakness of Legs: Both Weakness of Arms/Hands: Both      Emotional Assessment     Affect (typically observed): Accepting, Appropriate Orientation: : Oriented to Self, Oriented to Place, Oriented to  Time, Oriented to Situation Alcohol / Substance Use: Not Applicable Psych Involvement: No (comment)  Admission diagnosis:  COPD exacerbation ([name]) [J44.1] Acute respiratory failure with hypoxia ([name]) [J96.01] Acute respiratory failure with hypoxia and hypercapnia ([name]) [J96.01, J96.02] Acute congestive heart failure, unspecified heart failure type [facility]) [I50.9] Patient Active Problem List   Diagnosis Date Noted  . Normocytic anemia 11/16/2019  . Cellulitis of both lower extremities 11/16/2019  . Chronic respiratory failure with hypoxia ([place]) 05/22/2019  . Leukocytosis 05/22/2019  . Chronic heart failure with preserved ejection fraction (HFpEF) ([place]) 05/22/2019  . Acute respiratory failure ([place]) 05/17/2019  . Obesity, Class III, BMI 40-49.9 (morbid obesity) ([place]) 05/17/2019  . Atrial flutter, paroxysmal ([place]) 05/17/2019  . COPD with acute exacerbation ([place]) 04/20/2019  . GERD (gastroesophageal reflux disease) 08/08/2018  . PUD (peptic ulcer disease)   . Gastric erosions 02/13/2017  .  Duodenal ulcer 02/13/2017  . Transaminitis   . UGI bleed   . Arm DVT (deep venous thromboembolism), acute, right ([place]) 12/18/2016  . Uremia 12/10/2016  . Acute renal failure ([place])   . Volume depletion 11/23/2016  . Hypotension   . Liver enzyme elevation   . 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 Arterial insufficiency of lower extremity ([place]) 11/15/2016  . Pressure injury of skin 11/09/2016  . Sepsis ([place]) 11/09/2016  . Osteomyelitis ([place]) 11/09/2016  . Wounds, multiple 11/08/2016  . Chronic venous insufficiency 10/12/2016  . Critical lower limb ischemia 10/12/2016  . Fatty liver 09/10/2016  . History of colonic polyps 09/10/2016  . Gallstone 09/10/2016  . Edema 03/04/2015  . AKI (acute kidney injury) ([place]) 03/04/2015  . Fall 03/04/2015  . Generalized weakness 03/04/2015  . Diabetic polyneuropathy associated with type 2 diabetes mellitus ([place])   . Anxiety   . PVD (peripheral vascular disease) ([place])   . Pneumonia 07/20/2014  . Weight gain 05/02/2014  . Coarse tremors 04/18/2014  . Anxiety state 04/18/2014  . Acute respiratory failure with hypoxia ([place]) 04/07/2014  . Acute on chronic diastolic CHF (congestive heart failure) ([place]) 04/07/2014  . Solitary pulmonary nodule 04/07/2014  . COPD with exacerbation ([place]) 04/02/2014  . Hypoxia 04/02/2014  . Cerebral infarction ([place]) 06/01/2012  . Chronic pain (back, legs) 05/30/2012  . Toe laceration, 4th toe 05/30/2012  . C. difficile diarrhea 05/30/2012  . Hypokalemia 05/30/2012  . Acute lacunar stroke ([place]) 05/27/2012  . Type 2 diabetes with nephropathy ([place]) 05/27/2012  . Smoker 05/27/2012  . HTN (hypertension) 05/27/2012   PCP:  [name], MD Pharmacy:   [facility], [address] [place] [address] Phone: [phone] Fax: [phone]  [facility], [place] [address] [place] [place] [address] Phone: [phone] Fax: [phone]    Readmission Risk Interventions Readmission Risk Prevention Plan 05/21/2019 05/18/2019 04/22/2019  Transportation Screening Complete Complete Complete  PCP or Specialist Appt within 3-5 Days - Complete -  HRI or Home Care Consult - Complete -  Social Work Consult for [place] Planning/Counseling - Complete -  Palliative Care Screening - Not Applicable -  Medication Review Press photographer) - Complete Complete  PCP or Specialist appointment within 3-5 days of discharge - - Not Complete  PCP/Specialist Appt Not Complete comments - - SNF MD  [place] or [place] - - Not Complete  HRI or Home Care Consult Pt Refusal Comments - - from SNF  SW Recovery Care/Counseling Consult - - Complete  Palliative Care Screening - - Not [place] - - Complete  Some recent data might be hidden

## 2019-11-17 NOTE — Progress Notes (Signed)
OT Cancellation Note  Patient Details Name: Eric Scott MRN: [id number] DOB: 02-15-1951   Cancelled Treatment:    Reason Eval/Treat Not Completed: OT screened, no needs identified, will sign off. Pt screened for OT needs. Pt is [name] term resident at SNF, receives assistance for all ADLs. Pt is non-ambulatory and staff uses mechanical lift for transfers. Pt is at baseline for ADLs and mobility. Recommend return to SNF at discharge. No further OT needs at this time.    Guadelupe Sabin, OTR/L  [phone] 11/17/2019, 8:17 AM

## 2019-11-17 NOTE — Progress Notes (Signed)
Patient Demographics:    Eric Scott, is a 69 y.o. male, DOB - 06-20-1950, UEK:[id number]  Admit date - 11/16/2019   Admitting Physician Andru Genter Denton Brick, MD  Outpatient Primary MD for the patient is [name], MD  LOS - 0   Chief Complaint  Patient presents with  . Respiratory Distress        Subjective:    Eric Scott today has no fevers, no emesis,  No chest pain,   -Episodes of lethargy less frequent patient did well on BiPAP -  Assessment  & Plan :    Principal Problem:   Acute respiratory failure with hypoxia ([name]) Active Problems:   Type 2 diabetes with nephropathy ([name])   HTN (hypertension)   Acute on chronic diastolic CHF (congestive heart failure) ([name])   Diabetic polyneuropathy associated with type 2 diabetes mellitus ([name])   GERD (gastroesophageal reflux disease)   COPD with acute exacerbation ([name])   Normocytic anemia   Cellulitis of both lower extremities   Respiratory failure with hypoxia [facility])  Brief Summary 69 y.o. male with medical history significant of normocytic anemia renal disease, anxiety, depression, spinal stenosis, diabetic peripheral neuropathy, chronic pain syndrome, stage V CKD, COPD, history of UGI due to duodenal ulcer, history of right upper extremity DVT, hyperlipidemia, hypertension, osteomyelitis of the ankle, peripheral edema, peripheral vascular disease, history of subcu left basal ganglia other nonhemorrhagic lacunar stroke, basal tremor, type 2 diabetes mellitus, diastolic CHF with last EF in December 2020 of 60 to 65% admitted on 11/16/2019 with acute metabolic encephalopathy with presumed hypoxia and hypercapnia -hypoxia and hypercapnia due to combination of COPD, pneumonia, CHF and possible OSA, as well as high ammonia levels--currently requiring BiPAP, IV Lasix and IV antibiotics    A/p 1) acute on chronic hypoxic respiratory failure with metabolic  encephalopathy and lethargy----overall improving after prolonged BiPAP use over the last couple days -Okay to change BiPAP to as needed  2) acute metabolic encephalopathy--suspect combination of hypercapnia and hyper ammonia ---  ammonia was 48 patient received lactulose had good BM mentation appears to be improving  3)PNA--improving, continue Rocephin and azithromycin along with bronchodilators --BiPAP nightly as above #1  4)HTN--stable, continue amlodipine 10 mg daily and metoprolol 50 mg twice daily  5)DM2=----currently on Lantus 38 units at bedtime along with sliding scale coverage  6) acute on chronic diastolic dysfunction/exacerbation--- overall hypoxia and respiratory status improving with IV diuresis, as well as BiPAP use--please see #1 above  7)Leg wounds----I doubt significant cellulitis at this time   Status is: Inpatient  Remains inpatient appropriate because:IV treatments appropriate due to intensity of illness or inability to take PO, Inpatient level of care appropriate due to severity of illness and IV Lasix, IV antibiotics   Disposition: The patient is from: SNF              Anticipated d/c is to: SNF              Anticipated d/c date is: 2 days              Patient currently is not medically stable to d/c. Barriers: Not Clinically Stable- --hypoxia and hypercapnia due to combination of COPD, pneumonia, CHF and possible OSA, as well as high ammonia levels--currently requiring  BiPAP, IV Lasix and IV antibiotics  Code Status : full  Family Communication:    (patient is alert, awake and coherent)   Consults  :  na  DVT Prophylaxis  :   SCDs  Lab Results  Component Value Date   PLT 178 11/17/2019    Inpatient Medications  Scheduled Meds: . allopurinol  100 mg Oral QHS  . amLODipine  10 mg Oral Daily  . aspirin EC  81 mg Oral Daily  . calcitRIOL  0.25 mcg Oral Daily  . calcium carbonate  2 tablet Oral BID  . Chlorhexidine Gluconate Cloth  6 each Topical  Daily  . cholecalciferol  3,000 Units Oral Daily  . feeding supplement (GLUCERNA SHAKE)  237 mL Oral TID BM  . FLUoxetine  20 mg Oral Daily  . fluticasone furoate-vilanterol  2 puff Inhalation Daily  . furosemide  60 mg Intravenous BID  . gabapentin  400 mg Oral Q8H  . insulin aspart  0-15 Units Subcutaneous TID WC  . insulin glargine  38 Units Subcutaneous QHS  . magnesium oxide  400 mg Oral Daily  . metoprolol tartrate  50 mg Oral BID  . multivitamin with minerals  1 tablet Oral Daily  . omega-3 acid ethyl esters  1 g Oral BID  . pantoprazole  40 mg Oral QAC breakfast  . tamsulosin  0.4 mg Oral QPC supper  . cyanocobalamin  1,000 mcg Oral Daily   Continuous Infusions: . sodium chloride 10 mL/hr at 11/17/19 0634  . azithromycin 500 mg (11/17/19 0641)  . cefTRIAXone (ROCEPHIN)  IV Stopped (11/17/19 0624)   PRN Meds:.sodium chloride, acetaminophen **OR** acetaminophen, albuterol, ALPRAZolam, hydrALAZINE, HYDROcodone-acetaminophen, hydrOXYzine, ipratropium    Anti-infectives (From admission, onward)   Start     Dose/Rate Route Frequency Ordered Stop   11/17/19 0500  cefTRIAXone (ROCEPHIN) 2 g in sodium chloride 0.9 % 100 mL IVPB     Discontinue    Note to Pharmacy: Please adjust if needed.   2 g 200 mL/hr over 30 Minutes Intravenous Every 24 hours 11/16/19 0529     11/17/19 0500  azithromycin (ZITHROMAX) 500 mg in sodium chloride 0.9 % 250 mL IVPB     Discontinue     500 mg 250 mL/hr over 60 Minutes Intravenous Every 24 hours 11/16/19 0532 11/22/19 0459   11/16/19 0545  cefTRIAXone (ROCEPHIN) 1 g in sodium chloride 0.9 % 100 mL IVPB       Note to Pharmacy: The patient's weight is over 100 kg.  Please adjust if needed.  Thank you.   1 g 200 mL/hr over 30 Minutes Intravenous  Once 11/16/19 0531 11/16/19 0709   11/16/19 0315  cefTRIAXone (ROCEPHIN) 1 g in sodium chloride 0.9 % 100 mL IVPB        1 g 200 mL/hr over 30 Minutes Intravenous  Once 11/16/19 0302 11/16/19 0354    11/16/19 0315  azithromycin (ZITHROMAX) 500 mg in sodium chloride 0.9 % 250 mL IVPB        500 mg 250 mL/hr over 60 Minutes Intravenous  Once 11/16/19 0302 11/16/19 0424        Objective:   Vitals:   11/17/19 1300 11/17/19 1400 11/17/19 1531 11/17/19 1549  BP: (!) 149/62 (!) 141/63 (!) 151/70 134/67  Pulse: 63 60 60 62  Resp: 19 13 20 17   Temp:    97.9 F (36.6 C)  TempSrc:    Oral  SpO2: 98% 96% 97% 98%  Weight:  Height:        Wt Readings from Last 3 Encounters:  11/17/19 (!) 137.2 kg  05/22/19 122 kg  05/21/19 132 kg     Intake/Output Summary (Last 24 hours) at 11/17/2019 2001 Last data filed at 11/17/2019 0634 Gross per 24 hour  Intake 101.72 ml  Output --  Net 101.72 ml     Physical Exam  Gen:-More awake, less sleepy HEENT:- [place].AT, No sclera icterus Nose--- BiPAP mask Neck-Supple Neck,No JVD,.  Lungs-diminished in bases, no wheezing CV- S1, S2 normal, regular  Abd-  +ve B.Sounds, Abd Soft, No tenderness,    Extremity/Skin:- No  edema, pedal pulses present Psych-less confused, less disoriented, Neuro-chronic neuromuscular deficits, patient is essentially bedbound at baseline  -no new focal deficits, no tremors   Data Review:   Micro Results Recent Results (from the past 240 hour(s))  SARS Coronavirus 2 by RT PCR (hospital order, performed in [facility] hospital lab) Nasopharyngeal Nasopharyngeal Swab     Status: None   Collection Time: 11/16/19  2:15 AM   Specimen: Nasopharyngeal Swab  Result Value Ref Range Status   SARS Coronavirus 2 NEGATIVE NEGATIVE Final    Comment: (NOTE) SARS-CoV-2 target nucleic acids are NOT DETECTED.  The SARS-CoV-2 RNA is generally detectable in upper and lower respiratory specimens during the acute phase of infection. The lowest concentration of SARS-CoV-2 viral copies this assay can detect is 250 copies / mL. A negative result does not preclude SARS-CoV-2 infection and should not be used as the sole basis for  treatment or other patient management decisions.  A negative result may occur with improper specimen collection / handling, submission of specimen other than nasopharyngeal swab, presence of viral mutation(s) within the areas targeted by this assay, and inadequate number of viral copies (<250 copies / mL). A negative result must be combined with clinical observations, patient history, and epidemiological information.  Fact Sheet for Patients:   StrictlyIdeas.no  Fact Sheet for Healthcare Providers: BankingDealers.co.za  This test is not yet approved or  cleared by the [place] FDA and has been authorized for detection and/or diagnosis of SARS-CoV-2 by FDA under an Emergency Use Authorization (EUA).  This EUA will remain in effect (meaning this test can be used) for the duration of the COVID-19 declaration under Section 564(b)(1) of the Act, 21 U.S.C. section 360bbb-3(b)(1), unless the authorization is terminated or revoked sooner.  Performed at [facility], [address], [place], [place] [address]   Culture, blood (routine x 2)     Status: None (Preliminary result)   Collection Time: 11/16/19  2:26 AM   Specimen: BLOOD RIGHT HAND  Result Value Ref Range Status   Specimen Description BLOOD RIGHT HAND  Final   Special Requests   Final    BOTTLES DRAWN AEROBIC AND ANAEROBIC Blood Culture adequate volume   Culture   Final    NO GROWTH 1 DAY Performed at [facility], [address] [place], [place] [address]    Report Status PENDING  Incomplete  Culture, blood (routine x 2)     Status: None (Preliminary result)   Collection Time: 11/16/19  3:13 AM   Specimen: BLOOD LEFT HAND  Result Value Ref Range Status   Specimen Description BLOOD LEFT HAND  Final   Special Requests   Final    BOTTLES DRAWN AEROBIC AND ANAEROBIC Blood Culture adequate volume   Culture   Final    NO GROWTH 1 DAY Performed at [facility] [place]  [address] [place], [place] [address]    Report Status PENDING  Incomplete  MRSA PCR Screening     Status: None   Collection Time: 11/16/19  3:27 PM   Specimen: Nasal Mucosa; Nasopharyngeal  Result Value Ref Range Status   MRSA by PCR NEGATIVE NEGATIVE Final    Comment:        The GeneXpert MRSA Assay (FDA approved for NASAL specimens only), is one component of a comprehensive MRSA colonization surveillance program. It is not intended to diagnose MRSA infection nor to guide or monitor treatment for MRSA infections. Performed at [facility], [address], [place], [place] [address]     Radiology Reports DG CHEST PORT 1 VIEW  Result Date: 11/17/2019 CLINICAL DATA:  Congestive heart failure EXAM: PORTABLE CHEST 1 VIEW COMPARISON:  November 16, 2019 FINDINGS: There is unchanged cardiomegaly with aortic knob calcifications. There is mild prominence of the central pulmonary vasculature. There is slight interval improvement in the diffusely hazy interstitial markings. No pleural effusion. No acute osseous abnormality. IMPRESSION: Cardiomegaly and pulmonary vascular congestion. Slight interval improvement in the diffusely increased interstitial markings. Electronically Signed   By: Prudencio Pair M.D.   On: 11/17/2019 05:52   DG Chest Port 1 View  Result Date: 11/16/2019 CLINICAL DATA:  Shortness of breath EXAM: PORTABLE CHEST 1 VIEW COMPARISON:  05/22/2019 FINDINGS: Cardiomegaly, vascular congestion. Diffuse interstitial prominence could reflect interstitial edema. No confluent opacities or effusions. No acute bony abnormality. IMPRESSION: Cardiomegaly with vascular congestion and probable early interstitial edema. Electronically Signed   By: Rolm Baptise M.D.   On: 11/16/2019 02:46     CBC Recent Labs  Lab 11/16/19 0215 11/17/19 0704  WBC 16.8* 7.7  HGB 9.5* 7.4*  HCT 31.2* 24.2*  PLT 248 178  MCV 89.4 89.0  MCH 27.2 27.2  MCHC 30.4 30.6  RDW 17.2* 16.9*  LYMPHSABS 2.4  --   MONOABS 1.0  --     EOSABS 0.7*  --   BASOSABS 0.1  --     Chemistries  Recent Labs  Lab 11/16/19 0215 11/17/19 0343  NA 143 141  K 3.9 4.4  CL 101 106  CO2 26 25  GLUCOSE 252* 209*  BUN 45* 58*  CREATININE 3.62* 3.68*  CALCIUM 9.2 8.5*  MG 2.1  --   AST 13*  --   ALT 8  --   ALKPHOS 89  --   BILITOT 0.8  --    ------------------------------------------------------------------------------------------------------------------ No results for input(s): CHOL, HDL, LDLCALC, TRIG, CHOLHDL, LDLDIRECT in the last 72 hours.  Lab Results  Component Value Date   HGBA1C 7.0 (H) 11/16/2019   ------------------------------------------------------------------------------------------------------------------ No results for input(s): TSH, T4TOTAL, T3FREE, THYROIDAB in the last 72 hours.  Invalid input(s): FREET3 ------------------------------------------------------------------------------------------------------------------ No results for input(s): VITAMINB12, FOLATE, FERRITIN, TIBC, IRON, RETICCTPCT in the last 72 hours.  Coagulation profile No results for input(s): INR, PROTIME in the last 168 hours.  No results for input(s): DDIMER in the last 72 hours.  Cardiac Enzymes No results for input(s): CKMB, TROPONINI, MYOGLOBIN in the last 168 hours.  Invalid input(s): CK ------------------------------------------------------------------------------------------------------------------    Component Value Date/Time   BNP 386.0 (H) 11/16/2019 8916     Roxan Hockey M.D on 11/17/2019 at 8:01 PM  Go to [email or web address] - for contact info  [facility] - Office  [phone]          -

## 2019-11-17 NOTE — Progress Notes (Signed)
Initial Nutrition Assessment  DOCUMENTATION CODES:   Obesity unspecified  INTERVENTION:  Glucerna Shake po TID, each supplement provides 220 kcal and 10 grams of protein (strawberry)  Advance diet as medically feasible  Shared food preferences updated in [employer]  NUTRITION DIAGNOSIS:   Inadequate oral intake related to decreased appetite as evidenced by per patient/family report, meal completion < 25%.  GOAL:   Patient will meet greater than or equal to 90% of their needs    MONITOR:   PO intake, Supplement acceptance, Labs, Weight trends, I & O's, Skin  REASON FOR ASSESSMENT:   Consult Assessment of nutrition requirement/status  ASSESSMENT:  69 year old male with past medical history of normocytic anemia, renal disease, anxiety, depression, spinal stenosis, diabetic peripheral neuropathy, chronic pain syndrome, CKD stage V, COPD, history of UGI due to duodenal ulcer, history of RUE DVT, HLD, HTN, osteomyelitis of ankle, peripheral edema, PVD, history of left basal ganglia, nonhemorrhagic lacunar stroke, basal tremor, DM2, dCHF admitted for acute respiratory failure with hypoxia after presenting from SNF due to progressively worse dyspnea with hypoxia associated with chest pressure and mild confusion.  Patient resting, easily awakened with name call. Breakfast tray at bedside, poor intake noted with only bites of oatmeal and a few sips of juice taken. Patient states that his appetite is "way better than this" at baseline. He reports dislike of food at SNF, usually eats a bowl of cereal for breakfast, reports they serve pasta everyday for lunch which he does not like, eats assorted sandwiches instead. Patient reports typically having another sandwich for dinner because he doesn't like what they are having. RD attempted to obtain food preferences, only provides his dislike of eggs. RD encouraged po intake of meals offering supplement to aid with needs, he is amenable to trying  strawberry Glucerna.  Current wt 301.84 lb Patient unable to recall usual body weight.  Per chart, on 05/22/19 pt weighed 268.4 lb, on 05/21/19 he weighed 290.4 lb, on 04/21/19 he weighed 307.34 lb, on 04/06/19 pt weighed 302.28 lb. Noted stable 255-260 lb from 03/2017-08/2018. Patient is non-ambulatory, staff at facility provides assistance for all ADL's and uses mechanical lift for transfers. Given generalized mild-pitting edema, noted early interstitial edema on CXR and non-ambulatory status, will use IBW for estimating needs.   I/Os: -202 ml since admit UOP: 400 ml since admit  Per notes: -CXR shows cardiomegaly with vascular congestion, early interstitial edema -continue Lasix 60 mg IVP twice daily -supplemental oxygen with BiPAP ventilation  Medications reviewed and include:Calcitriol, D3, Gabapentin, SSI, Lantus 38 units at bedtime, Lactulose, Tums, Mag-ox, MVI, Lovaza, Protonix, B12, Lasix IVPB: Zithromax, Rocephin Labs:  CBGs 197,259,257,247,277, BUN 58 (H), Cr 3.68 (H), Ammonia 48 (H), BNP 386 (H)  NUTRITION - FOCUSED PHYSICAL EXAM: Mild orbital fat depletion, generalized mild pitting edema   Diet Order:   Diet Order            DIET SOFT Room service appropriate? Yes; Fluid consistency: Thin  Diet effective now                 EDUCATION NEEDS:   Education needs have been addressed  Skin:  Skin Assessment:  (cellulitis BLE)  Last BM:  unknown  Height:   Ht Readings from Last 1 Encounters:  11/16/19 6' (1.829 m)    Weight:   Wt Readings from Last 1 Encounters:  11/17/19 (!) 137.2 kg    Ideal Body Weight:  80.9 kg  BMI:  Body mass index is  41.02 kg/m.  Estimated Nutritional Needs:   Kcal:  2100-2300  Protein:  110-120  Fluid:  >/= 2 L   Lajuan Lines, RD, LDN Clinical Nutrition After Hours/Weekend Pager # in [place]

## 2019-11-18 LAB — COMPREHENSIVE METABOLIC PANEL
ALT: 9 U/L (ref 0–44)
AST: 10 U/L — ABNORMAL LOW (ref 15–41)
Albumin: 3.1 g/dL — ABNORMAL LOW (ref 3.5–5.0)
Alkaline Phosphatase: 57 U/L (ref 38–126)
Anion gap: 11 (ref 5–15)
BUN: 61 mg/dL — ABNORMAL HIGH (ref 8–23)
CO2: 26 mmol/L (ref 22–32)
Calcium: 8.6 mg/dL — ABNORMAL LOW (ref 8.9–10.3)
Chloride: 104 mmol/L (ref 98–111)
Creatinine, Ser: 3.73 mg/dL — ABNORMAL HIGH (ref 0.61–1.24)
GFR calc Af Amer: 18 mL/min — ABNORMAL LOW (ref >60)
GFR calc non Af Amer: 16 mL/min — ABNORMAL LOW (ref >60)
Glucose, Bld: 150 mg/dL — ABNORMAL HIGH (ref 70–99)
Potassium: 3.5 mmol/L (ref 3.5–5.1)
Sodium: 141 mmol/L (ref 135–145)
Total Bilirubin: 0.4 mg/dL (ref 0.3–1.2)
Total Protein: 7.3 g/dL (ref 6.5–8.1)

## 2019-11-18 LAB — CBC
HCT: 26 % — ABNORMAL LOW (ref 39.0–52.0)
Hemoglobin: 7.9 g/dL — ABNORMAL LOW (ref 13.0–17.0)
MCH: 27.2 pg (ref 26.0–34.0)
MCHC: 30.4 g/dL (ref 30.0–36.0)
MCV: 89.7 fL (ref 80.0–100.0)
Platelets: 214 10*3/uL (ref 150–400)
RBC: 2.9 MIL/uL — ABNORMAL LOW (ref 4.22–5.81)
RDW: 17 % — ABNORMAL HIGH (ref 11.5–15.5)
WBC: 10.5 10*3/uL (ref 4.0–10.5)
nRBC: 0 % (ref 0.0–0.2)

## 2019-11-18 LAB — GLUCOSE, CAPILLARY
Glucose-Capillary: 125 mg/dL — ABNORMAL HIGH (ref 70–99)
Glucose-Capillary: 185 mg/dL — ABNORMAL HIGH (ref 70–99)

## 2019-11-18 MED ORDER — MUCINEX 600 MG PO TB12
600.0000 mg | ORAL_TABLET | Freq: Two times a day (BID) | ORAL | 0 refills | Status: AC
Start: 2019-11-18 — End: 2019-11-28

## 2019-11-18 MED ORDER — CEFDINIR 300 MG PO CAPS
300.0000 mg | ORAL_CAPSULE | Freq: Two times a day (BID) | ORAL | 0 refills | Status: DC
Start: 2019-11-18 — End: 2019-11-18

## 2019-11-18 MED ORDER — ALPRAZOLAM 0.5 MG PO TABS: 0.5000 mg | ORAL_TABLET | Freq: Every evening | ORAL | 0 refills | Status: DC | PRN

## 2019-11-18 MED ORDER — CEFDINIR 300 MG PO CAPS
300.0000 mg | ORAL_CAPSULE | Freq: Every day | ORAL | 0 refills | Status: AC
Start: 2019-11-18 — End: 2019-11-23

## 2019-11-18 MED ORDER — HYDROXYZINE HCL 25 MG PO TABS: 25.0000 mg | ORAL_TABLET | Freq: Two times a day (BID) | ORAL | 0 refills | Status: DC | PRN

## 2019-11-18 MED ORDER — TAMSULOSIN HCL 0.4 MG PO CAPS: 0.4000 mg | ORAL_CAPSULE | Freq: Every day | ORAL | 2 refills | Status: DC

## 2019-11-18 MED ORDER — IPRATROPIUM-ALBUTEROL 0.5-2.5 (3) MG/3ML IN SOLN: 3.0000 mL | Freq: Two times a day (BID) | RESPIRATORY_TRACT | Status: DC

## 2019-11-18 MED ORDER — PREDNISONE 20 MG PO TABS: 40.0000 mg | ORAL_TABLET | Freq: Every day | ORAL | 0 refills | Status: DC

## 2019-11-18 MED ORDER — ASPIRIN EC 81 MG PO TBEC: 81.0000 mg | DELAYED_RELEASE_TABLET | Freq: Every day | ORAL | 11 refills | Status: DC

## 2019-11-18 MED ORDER — ACETAMINOPHEN 325 MG PO TABS: 650.0000 mg | ORAL_TABLET | Freq: Four times a day (QID) | ORAL | 0 refills | Status: DC | PRN

## 2019-11-18 MED ORDER — LACTULOSE 10 GM/15ML PO SOLN: 10.0000 g | ORAL | 1 refills | Status: DC

## 2019-11-18 MED ORDER — HYDROCODONE-ACETAMINOPHEN 5-325 MG PO TABS: 1.0000 | ORAL_TABLET | Freq: Two times a day (BID) | ORAL | 0 refills | Status: DC | PRN

## 2019-11-18 MED ORDER — AZITHROMYCIN 500 MG PO TABS
500.0000 mg | ORAL_TABLET | Freq: Every day | ORAL | 0 refills | Status: AC
Start: 2019-11-18 — End: 2019-11-21

## 2019-11-18 MED ORDER — TORSEMIDE 20 MG PO TABS: ORAL_TABLET | ORAL | 1 refills | Status: DC

## 2019-11-18 NOTE — Discharge Summary (Signed)
Eric Scott, is a 69 y.o. male  DOB Jun 03, 1951  MRN [id number].  Admission date:  11/16/2019  Admitting Physician  Josiephine Simao Denton Brick, MD  Discharge Date:  11/18/2019   Primary MD  [name], MD  Recommendations for primary care physician for things to follow:   1)Very low-salt diet advised 2)Daily Weight advised,  call if you gain more than 3 pounds in 1 day or more than 5 pounds in 1 week as your diuretic medications may need to be adjusted 3)Limit your Fluid  intake to no more than 60 ounces (1.8 Liters) per day 4) Please use oxygen via nasal cannula at 2 L/min continuously 5)OmnicefCefdinir--should be 300 mg once daily only Not twice daily due to renal function adjustment  Admission Diagnosis  COPD exacerbation ([name]) [J44.1] Acute respiratory failure with hypoxia ([name]) [J96.01] Acute respiratory failure with hypoxia and hypercapnia ([name]) [J96.01, J96.02] Acute congestive heart failure, unspecified heart failure type ([name]) [I50.9] Respiratory failure with hypoxia ([name]) [J96.91]   Discharge Diagnosis  COPD exacerbation ([name]) [J44.1] Acute respiratory failure with hypoxia ([name]) [J96.01] Acute respiratory failure with hypoxia and hypercapnia ([name]) [J96.01, J96.02] Acute congestive heart failure, unspecified heart failure type ([name]) [I50.9] Respiratory failure with hypoxia ([place]) [J96.91]    Principal Problem:   Acute respiratory failure with hypoxia ([name]) Active Problems:   Acute on chronic diastolic CHF (congestive heart failure) ([name])   COPD with acute exacerbation ([name])   Respiratory failure with hypoxia ([name])   Type 2 diabetes with nephropathy ([name])   HTN (hypertension)   Diabetic polyneuropathy associated with type 2 diabetes mellitus ([name])   GERD (gastroesophageal reflux disease)   Normocytic anemia   Cellulitis of both lower extremities      Past Medical History:  Diagnosis Date  .  Anemia   . Anxiety   . Chronic pain    legs, back; MRI 05/2012 with mild thoracic degenerative changes no spinal stenosis   . 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 COPD (chronic obstructive pulmonary disease) ([place])   . Depression   . Diabetic peripheral neuropathy ([place])    "chronic" (05/27/2012)  . Diastolic CHF ([place])   . Duodenal ulcer hemorrhage   . DVT (deep venous thrombosis) ([name])    right upper arm  . GERD (gastroesophageal reflux disease)   . Hypercholesteremia   . Hypertension   . 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 Peripheral edema   . PVD (peripheral vascular disease) ([place])   . Renal disorder   . Spinal stenosis    mild lumbar (MRI 05/2012)-L2-L3 to L4-L5 , mild lumbar foraminal stenosis   . Stroke ([place]) 05/2012   Subacute, lacunar infarcts within the left basal ganglia and posterior limp of the left internal capsule/thalamus; "RUE; both feet weak" (05/27/2012)  . Tremor   . Type II diabetes mellitus ([place])     Past Surgical History:  Procedure Laterality Date  . ANKLE SURGERY    . ESOPHAGOGASTRODUODENOSCOPY (EGD) WITH PROPOFOL N/A 12/21/2016   Procedure: ESOPHAGOGASTRODUODENOSCOPY (  EGD) WITH PROPOFOL;  Surgeon: Daneil Dolin, MD;  [facility];  Service: Gastroenterology;  Laterality: N/A;  . ESOPHAGOGASTRODUODENOSCOPY (EGD) WITH PROPOFOL N/A 04/02/2017   Procedure: ESOPHAGOGASTRODUODENOSCOPY (EGD) WITH PROPOFOL;  Surgeon: Danie Binder, MD;  Location: [facility];  Service: Endoscopy;  Laterality: N/A;  1030   . HERNIA REPAIR  01/05/2004   "belly button" (05/27/2012)     HPI  from the history and physical done on the day of admission:     Chief Complaint: Shortness of breath.  HPI: Eric Scott is a 69 y.o. male with medical history significant of normocytic anemia renal disease, anxiety, depression, spinal stenosis, diabetic peripheral neuropathy, chronic pain syndrome, stage V CKD, COPD, history of UGI due to duodenal ulcer,  history of right upper extremity DVT, hyperlipidemia, hypertension, osteomyelitis of the ankle, peripheral edema, peripheral vascular disease, history of subcu left basal ganglia other nonhemorrhagic lacunar stroke, basal tremor, type 2 diabetes mellitus, diastolic CHF with last EF in December 2020 of 60 to 65% who is brought to the emergency department due to progressively worse dyspnea with hypoxia in the 70s on room air associated with chest pressure and mild confusion.    The patient stated that he has been feeling mildly dyspneic for the past 2 to 3 days.  However, around 10 PM while in bed he became dyspneic.  The nursing facility increased his oxygen to 5 LPM and this improved his oxygen saturation to the 80s.  EMS reports that when they put him on the stretcher the patient again decreased to the 70s and became less responsive.  He was placed on NRB oxygen and O2 sat improved to 91% after this.  However, the patient was still tachypneic with respiratory distress when he arrived to the emergency department and was immediately placed on BiPAP ventilation mode.  He stated that he is feeling better, but still mildly winded when trying to converse.  He added that he has been coughing up whitish sputum for the past 2-3 days.  No chest pain now.  He denies headache, sore throat, abdominal pain, nausea, vomiting, diarrhea, constipation, melena or hematochezia.  No dysuria, frequency or hematuria.  No polyuria, polydipsia, polyphagia or blurred vision.  ED Course: Initial vital signs temperature 100.2 F, pulse 98, respirations 24, blood pressure 151/102 mmHg and O2 sat 100% on BiPAP ventilation mode.  He received a full inch of Nitropaste, furosemide 60 mg IVP x1, Solu-Medrol 125 mg IVP x1, ceftriaxone 1 g and azithromycin 500 mg IVPB.  Blood cultures x2 were drawn before antibiotics were given.  CBC showed a white count of 16.8, hemoglobin 9.5 g/dL and platelets 248.  Troponin was 10 and then 15 ng/L.  BNP  386.0 pg/mL.  CMP shows normal electrolytes.  Glucose 252, BUN 45 and creatinine 3.62 mg/dL.  Hepatic functions show a total protein of 8.7 g/dL, the rest of the LFTs are unremarkable.  Procalcitonin was 0.26 ng/mL.  SARS coronavirus 2 was negative.  His chest radiograph shows cardiomegaly with vascular congestion and probably early interstitial edema.  Please see image and full radiology report for further detail.  Review of Systems: As per HPI otherwise all other systems reviewed and are negative.   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, basal tremor, type 2 diabetes mellitus, diastolic CHF with last EF in December 2020 of60 to 65% admitted on 11/16/2019 with acute metabolic encephalopathy with presumed hypoxia and hypercapnia -hypoxia and hypercapnia due to combination of COPD, pneumonia, CHF and possible OSA, as well as high ammonia levels- -overall much improved with BiPAP use, IV diuresis, IV antibiotics and lactulose  A/p 1) acute on chronic hypoxic respiratory failure with metabolic encephalopathy and lethargy----acute hypoxic encephalopathy resolved as above -patient is longer requiring BiPAP -Discharge on oxygen and bronchodilators   2) acute metabolic encephalopathy--suspect combination of hypercapnia and hyper ammonia ---  ammonia was 48 -patient received lactulose had good BM -mentation appears to be back to baseline -Patient will benefit from lactulose 10 g every Monday and Friday to keep ammonia levels down  3)PNA--treated with Rocephin and  azithromycin along with bronchodilators --no longer requiring BiPAP  -Much improved respiratory status, okay to discharge on bronchodilators, prednisone, Omnicef and azithromycin  4)HTN--stable, continue amlodipine 10 mg daily and metoprolol 50 mg twice daily  5)DM2=----currently on Lantus 38 units at bedtime along with sliding scale coverage -Anticipate some worsening hyperglycemia while on prednisone  6) acute on chronic diastolic dysfunction/exacerbation--- overall hypoxia and respiratory status improving with IV diuresis, as well as BiPAP use--please see #1 above -Discharge on torsemide 40 mg every morning and 20 mg every afternoon  7)Leg wounds---clinically no evidence of significant cellulitis local wound care advised   Disposition: Discharge back to [facility] SNF The patient is from: SNF  Anticipated d/c is to: SNF  Code Status : full  Family Communication:    (patient is alert, awake and coherent)   Discharge Condition: Stable  Follow UP--PCP at SNF  Diet and Activity recommendation:  As advised  Discharge Instructions     Discharge Instructions    Call MD for:  difficulty breathing, headache or visual disturbances   Complete by: As directed    Call MD for:  persistant dizziness or light-headedness   Complete by: As directed    Call MD for:  persistant nausea and vomiting   Complete by: As directed    Call MD for:  severe uncontrolled pain   Complete by: As directed    Call MD for:  temperature >100.4   Complete by: As directed    Diet - low sodium heart healthy   Complete by: As directed    Discharge instructions   Complete by: As directed    1)Very low-salt diet advised 2)Daily Weight advised,  call if you gain more than 3 pounds in 1 day or more than 5 pounds in 1 week as your diuretic medications may need to be adjusted 3)Limit your Fluid  intake to no more than 60 ounces (1.8 Liters) per day 4) Please use oxygen via nasal cannula at 2  L/min continuously 5)OmnicefCefdinir--should be 300 mg once daily only Not twice daily due to renal function adjustment 6)Please give Lactulose- --Please give 15 mL [10 g total] every Monday and Friday due to tendency for high ammonia   Discharge wound care:   Complete by: As directed    Barrier cream   Increase activity slowly   Complete by: As directed        Discharge Medications     Allergies as of 11/18/2019      Reactions   Blueberry Flavor Other (See Comments)   Unknown   Cucumber Extract  Other (See Comments)   "Fells like I'm having a heart attack"   Flexeril [cyclobenzaprine Hcl] Other (See Comments)   "whole body  Tremors" (05/27/2012)   Kiwi Extract Other (See Comments)   "feels like I'm having a heart attack"      Medication List    TAKE these medications   acetaminophen 325 MG tablet Commonly known as: TYLENOL Take 2 tablets (650 mg total) by mouth every 6 (six) hours as needed for mild pain, fever or headache (or Fever >/= 101). What changed: reasons to take this   albuterol (2.5 MG/3ML) 0.083% nebulizer solution Commonly known as: PROVENTIL Take 2.5 mg by nebulization every 3 (three) hours as needed for wheezing or shortness of breath.   allopurinol 100 MG tablet Commonly known as: ZYLOPRIM Take 1 tablet by mouth daily. For gout   ALPRAZolam 0.5 MG tablet Commonly known as: XANAX Take 1 tablet (0.5 mg total) by mouth at bedtime as needed for anxiety or sleep. What changed: when to take this   amLODipine 10 MG tablet Commonly known as: NORVASC Take 1 tablet (10 mg total) by mouth daily.   aspirin EC 81 MG tablet Take 1 tablet (81 mg total) by mouth daily with breakfast. What changed: when to take this   azithromycin 500 MG tablet Commonly known as: ZITHROMAX Take 1 tablet (500 mg total) by mouth daily for 3 days.   Breo Ellipta 100-25 MCG/INH Aepb Generic drug: fluticasone furoate-vilanterol Inhale 2 puffs into the lungs daily.   calcitRIOL  0.25 MCG capsule Commonly known as: ROCALTROL Take 0.25 mcg by mouth daily.   calcium carbonate 500 MG chewable tablet Commonly known as: TUMS - dosed in mg elemental calcium Chew 2 tablets by mouth 2 (two) times daily.   cefdinir 300 MG capsule Commonly known as: OMNICEF Take 1 capsule (300 mg total) by mouth daily for 5 days. Adjusted for renal function   cholecalciferol 1000 units tablet Commonly known as: VITAMIN D Take 5,000 Units by mouth daily.   cyanocobalamin 1000 MCG tablet Take 1,000 mcg by mouth daily.   fish oil-omega-3 fatty acids 1000 MG capsule Take 1 g by mouth 2 (two) times daily.   FLUoxetine 20 MG capsule Commonly known as: PROZAC Take 20 mg by mouth daily.   gabapentin 400 MG capsule Commonly known as: NEURONTIN Take 400 mg by mouth 3 (three) times daily.   HumaLOG KwikPen 100 UNIT/ML KwikPen Generic drug: insulin lispro Inject 2-14 Units into the skin 2 (two) times daily. Per sliding scale 150-200= 2 units 201-250= 4 units 251-300= 6 units 301-349= 8 units 350-400=10units 401-450=12units 451-500=14units ABOVE 501- CALL MD BID- before meals and at bedtime.   HYDROcodone-acetaminophen 5-325 MG tablet Commonly known as: NORCO/VICODIN Take 1 tablet by mouth every 12 (twelve) hours as needed for moderate pain or severe pain. What changed:   when to take this  reasons to take this   hydrOXYzine 25 MG tablet Commonly known as: ATARAX/VISTARIL Take 1 tablet (25 mg total) by mouth every 12 (twelve) hours as needed for anxiety, itching, nausea or vomiting. What changed:   medication strength  how much to take  when to take this  reasons to take this   insulin glargine 100 UNIT/ML injection Commonly known as: LANTUS Inject 0.32 mLs (32 Units total) into the skin at bedtime. What changed: how much to take   lactulose 10 GM/15ML solution Commonly known as: CHRONULAC Take 15 mLs (10 g total) by mouth See admin instructions. --Please  give  48mL [10 g total] every Monday and Friday due to tendency for high ammonia   lidocaine 5 % Commonly known as: LIDODERM Place 1 patch onto the skin 2 (two) times daily. Remove & Discard patch within 12 hours or as directed by MD. Apply to lower back twice daily.   magnesium oxide 400 MG tablet Commonly known as: MAG-OX Take 400 mg by mouth daily.   metoprolol tartrate 50 MG tablet Commonly known as: LOPRESSOR Take 1 tablet (50 mg total) by mouth 2 (two) times daily.   Mucinex 600 MG 12 hr tablet Generic drug: guaiFENesin Take 1 tablet (600 mg total) by mouth 2 (two) times daily for 10 days.   multivitamin tablet Take 1 tablet by mouth daily. For wound healing   pantoprazole 40 MG tablet Commonly known as: PROTONIX Take 1 tablet (40 mg total) by mouth daily before breakfast.   predniSONE 20 MG tablet Commonly known as: Deltasone Take 2 tablets (40 mg total) by mouth daily with breakfast.   tamsulosin 0.4 MG Caps capsule Commonly known as: FLOMAX Take 1 capsule (0.4 mg total) by mouth daily after supper.   torsemide 20 MG tablet Commonly known as: Demadex Take 40 mg (2 tabs) in the morning and 20 mg (1 tab) in the evening) What changed:   how much to take  additional instructions            Discharge Care Instructions  (From admission, onward)         Start     Ordered   11/18/19 0000  Discharge wound care:       Comments: Barrier cream   11/18/19 1147         Major procedures and Radiology Reports - PLEASE review detailed and final reports for all details, in brief -   DG CHEST PORT 1 VIEW  Result Date: 11/17/2019 CLINICAL DATA:  Congestive heart failure EXAM: PORTABLE CHEST 1 VIEW COMPARISON:  November 16, 2019 FINDINGS: There is unchanged cardiomegaly with aortic knob calcifications. There is mild prominence of the central pulmonary vasculature. There is slight interval improvement in the diffusely hazy interstitial markings. No pleural effusion. No acute  osseous abnormality. IMPRESSION: Cardiomegaly and pulmonary vascular congestion. Slight interval improvement in the diffusely increased interstitial markings. Electronically Signed   By: Prudencio Pair M.D.   On: 11/17/2019 05:52   DG Chest Port 1 View  Result Date: 11/16/2019 CLINICAL DATA:  Shortness of breath EXAM: PORTABLE CHEST 1 VIEW COMPARISON:  05/22/2019 FINDINGS: Cardiomegaly, vascular congestion. Diffuse interstitial prominence could reflect interstitial edema. No confluent opacities or effusions. No acute bony abnormality. IMPRESSION: Cardiomegaly with vascular congestion and probable early interstitial edema. Electronically Signed   By: Rolm Baptise M.D.   On: 11/16/2019 02:46    Micro Results   Recent Results (from the past 240 hour(s))  SARS Coronavirus 2 by RT PCR (hospital order, performed in [facility] hospital lab) Nasopharyngeal Nasopharyngeal Swab     Status: None   Collection Time: 11/16/19  2:15 AM   Specimen: Nasopharyngeal Swab  Result Value Ref Range Status   SARS Coronavirus 2 NEGATIVE NEGATIVE Final    Comment: (NOTE) SARS-CoV-2 target nucleic acids are NOT DETECTED.  The SARS-CoV-2 RNA is generally detectable in upper and lower respiratory specimens during the acute phase of infection. The lowest concentration of SARS-CoV-2 viral copies this assay can detect is 250 copies / mL. A negative result does not preclude SARS-CoV-2 infection and should not be used as  the sole basis for treatment or other patient management decisions.  A negative result may occur with improper specimen collection / handling, submission of specimen other than nasopharyngeal swab, presence of viral mutation(s) within the areas targeted by this assay, and inadequate number of viral copies (<250 copies / mL). A negative result must be combined with clinical observations, patient history, and epidemiological information.  Fact Sheet for Patients:     StrictlyIdeas.no  Fact Sheet for Healthcare Providers: BankingDealers.co.za  This test is not yet approved or  cleared by the [place] FDA and has been authorized for detection and/or diagnosis of SARS-CoV-2 by FDA under an Emergency Use Authorization (EUA).  This EUA will remain in effect (meaning this test can be used) for the duration of the COVID-19 declaration under Section 564(b)(1) of the Act, 21 U.S.C. section 360bbb-3(b)(1), unless the authorization is terminated or revoked sooner.  Performed at [facility], [address] [place], [place] [address]   Culture, blood (routine x 2)     Status: None (Preliminary result)   Collection Time: 11/16/19  2:26 AM   Specimen: BLOOD RIGHT HAND  Result Value Ref Range Status   Specimen Description BLOOD RIGHT HAND  Final   Special Requests   Final    BOTTLES DRAWN AEROBIC AND ANAEROBIC Blood Culture adequate volume   Culture   Final    NO GROWTH 2 DAYS Performed at [facility], [address] [place], [place] [address]    Report Status PENDING  Incomplete  Culture, blood (routine x 2)     Status: None (Preliminary result)   Collection Time: 11/16/19  3:13 AM   Specimen: BLOOD LEFT HAND  Result Value Ref Range Status   Specimen Description BLOOD LEFT HAND  Final   Special Requests   Final    BOTTLES DRAWN AEROBIC AND ANAEROBIC Blood Culture adequate volume   Culture   Final    NO GROWTH 2 DAYS Performed at [facility], [address], [place], [place] [address]    Report Status PENDING  Incomplete  MRSA PCR Screening     Status: None   Collection Time: 11/16/19  3:27 PM   Specimen: Nasal Mucosa; Nasopharyngeal  Result Value Ref Range Status   MRSA by PCR NEGATIVE NEGATIVE Final    Comment:        The GeneXpert MRSA Assay (FDA approved for NASAL specimens only), is one component of a comprehensive MRSA colonization surveillance program. It is not intended to diagnose  MRSA infection nor to guide or monitor treatment for MRSA infections. Performed at [facility], [address] [place], [place] [address]     Today   [place] today has no new complaints No fever  Or chills  -respiratory status is improved significantly, --mentation and level of alertness have improved significantly as well          Patient has been seen and examined prior to discharge   Objective   Blood pressure (!) 143/78, pulse 63, temperature 98.1 F (36.7 C), temperature source Oral, resp. rate 16, height 6' (1.829 m), weight (!) 137.2 kg, SpO2 93 %.   Intake/Output Summary (Last 24 hours) at 11/18/2019 1204 Last data filed at 11/18/2019 0500 Gross per 24 hour  Intake --  Output 800 ml  Net -800 ml    Exam Gen:- Awake Alert, no acute distress , speaking in complete sentences HEENT:- [place].AT, No sclera icterus Nose-  3L/min Neck-Supple Neck,No JVD,.  Lungs-improving  air movement, no wheezing CV- S1, S2 normal, regular Abd-  +ve B.Sounds, Abd Soft, No tenderness,    Extremity/Skin:- No significant edema,   good pulses Psych-affect is appropriate, oriented x3 Neuro--chronic neuromuscular deficits, patient is essentially bedbound at baseline  -no new focal deficits,    Data Review   CBC w Diff:  Lab Results  Component Value Date   WBC 10.5 11/18/2019   HGB 7.9 (L) 11/18/2019   HCT 26.0 (L) 11/18/2019   PLT 214 11/18/2019   LYMPHOPCT 14 11/16/2019   MONOPCT 6 11/16/2019   EOSPCT 4 11/16/2019   BASOPCT 0 11/16/2019    CMP:  Lab Results  Component Value Date   NA 141 11/18/2019   K 3.5 11/18/2019   CL 104 11/18/2019   CO2 26 11/18/2019   BUN 61 (H) 11/18/2019   CREATININE 3.73 (H) 11/18/2019   PROT 7.3 11/18/2019   ALBUMIN 3.1 (L) 11/18/2019   BILITOT 0.4 11/18/2019   ALKPHOS 57 11/18/2019   AST 10 (L) 11/18/2019   ALT 9 11/18/2019  .   Total Discharge time is about 33 minutes  Roxan Hockey M.D on 11/18/2019 at 12:04  PM  Go to [email or web address] -  for contact info  [facility] - Office  [phone]

## 2019-11-18 NOTE — Progress Notes (Signed)
Patient continues to remain on his 4lpm [place] with BIPAP on standby at bedside. Nursing has been made aware that if patient status changes that Rt will place patient on BIPAP.

## 2019-11-18 NOTE — Progress Notes (Signed)
Report called in to Egan at [place].

## 2019-11-18 NOTE — Discharge Instructions (Signed)
1)Very low-salt diet advised 2)Daily Weight advised,  call if you gain more than 3 pounds in 1 day or more than 5 pounds in 1 week as your diuretic medications may need to be adjusted 3)Limit your Fluid  intake to no more than 60 ounces (1.8 Liters) per day 4) Please use oxygen via nasal cannula at 2 L/min continuously 5)OmnicefCefdinir--should be 300 mg once daily only Not twice daily due to renal function adjustment

## 2019-11-18 NOTE — TOC Transition Note (Signed)
Transition of Care [facility]) - CM/SW Discharge Note   Patient Details  Name: KEYLIN PODOLSKY MRN: [id number] Date of Birth: July 09, 1950  Transition of Care [facility]) CM/SW Contact:  Natasha Bence, LCSW Phone Number: 11/18/2019, 10:17 AM   Clinical Narrative:    The patient will be discharging back to [facility] has been notified of discharge. Patient's son has also been notified of discharge. Nurse to call report. Will arrange transport.  Expected Discharge Plan: [place]     Patient Goals and CMS Choice        Expected Discharge Plan and Services Expected Discharge Plan: [facility] In-house Referral: Clinical Social Work       Expected Discharge Date: 11/18/19                                    Prior Living Arrangements/Services   Lives with:: Facility Resident Patient language and need for interpreter reviewed:: Yes Do you feel safe going back to the place where you live?: Yes      Need for Family Participation in Patient Care: Yes (Comment)   Current home services: DME Criminal Activity/Legal Involvement Pertinent to Current Situation/Hospitalization: No - Comment as needed  Activities of Daily Living Home Assistive Devices/Equipment: Eyeglasses, Oxygen ADL Screening (condition at time of admission) Patient's cognitive ability adequate to safely complete daily activities?: Yes Is the patient deaf or have difficulty hearing?: No Does the patient have difficulty seeing, even when wearing glasses/contacts?: No Does the patient have difficulty concentrating, remembering, or making decisions?: No Patient able to express need for assistance with ADLs?: Yes Does the patient have difficulty dressing or bathing?: Yes Independently performs ADLs?: No Communication: Independent Dressing (OT): Needs assistance Is this a change from baseline?: Pre-admission baseline Grooming: Needs assistance Is this a change from baseline?:  Pre-admission baseline Feeding: Independent Bathing: Needs assistance Is this a change from baseline?: Pre-admission baseline Toileting: Needs assistance Is this a change from baseline?: Pre-admission baseline In/Out Bed: Needs assistance Is this a change from baseline?: Pre-admission baseline Walks in Home: Dependent Is this a change from baseline?: Pre-admission baseline Does the patient have difficulty walking or climbing stairs?: Yes Weakness of Legs: Both Weakness of Arms/Hands: Both  Permission Sought/Granted                  Emotional Assessment     Affect (typically observed): Accepting, Appropriate Orientation: : Oriented to Self, Oriented to Place, Oriented to  Time, Oriented to Situation Alcohol / Substance Use: Not Applicable Psych Involvement: No (comment)  Admission diagnosis:  COPD exacerbation ([name]) [J44.1] Acute respiratory failure with hypoxia ([name]) [J96.01] Acute respiratory failure with hypoxia and hypercapnia ([name]) [J96.01, J96.02] Acute congestive heart failure, unspecified heart failure type ([place]) [I50.9] Respiratory failure with hypoxia ([place]) [J96.91] Patient Active Problem List   Diagnosis Date Noted  . Respiratory failure with hypoxia ([place]) 11/17/2019  . Normocytic anemia 11/16/2019  . Cellulitis of both lower extremities 11/16/2019  . Chronic respiratory failure with hypoxia ([place]) 05/22/2019  . Leukocytosis 05/22/2019  . Chronic heart failure with preserved ejection fraction (HFpEF) ([place]) 05/22/2019  . Acute respiratory failure ([place]) 05/17/2019  . Obesity, Class III, BMI 40-49.9 (morbid obesity) ([place]) 05/17/2019  . Atrial flutter, paroxysmal ([place]) 05/17/2019  . COPD with acute exacerbation ([place]) 04/20/2019  . GERD (gastroesophageal reflux disease) 08/08/2018  . PUD (peptic ulcer disease)   . Gastric  erosions 02/13/2017  . Duodenal ulcer 02/13/2017  . Transaminitis   . UGI bleed   . Arm DVT (deep venous thromboembolism), acute, right  ([place]) 12/18/2016  . Uremia 12/10/2016  . Acute renal failure ([place])   . Volume depletion 11/23/2016  . Hypotension   . Liver enzyme elevation   . 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 Arterial insufficiency of lower extremity ([place]) 11/15/2016  . Pressure injury of skin 11/09/2016  . Sepsis ([place]) 11/09/2016  . Osteomyelitis ([place]) 11/09/2016  . Wounds, multiple 11/08/2016  . Chronic venous insufficiency 10/12/2016  . Critical lower limb ischemia 10/12/2016  . Fatty liver 09/10/2016  . History of colonic polyps 09/10/2016  . Gallstone 09/10/2016  . Edema 03/04/2015  . AKI (acute kidney injury) ([place]) 03/04/2015  . Fall 03/04/2015  . Generalized weakness 03/04/2015  . Diabetic polyneuropathy associated with type 2 diabetes mellitus ([place])   . Anxiety   . PVD (peripheral vascular disease) ([place])   . Pneumonia 07/20/2014  . Weight gain 05/02/2014  . Coarse tremors 04/18/2014  . Anxiety state 04/18/2014  . Acute respiratory failure with hypoxia ([place]) 04/07/2014  . Acute on chronic diastolic CHF (congestive heart failure) ([place]) 04/07/2014  . Solitary pulmonary nodule 04/07/2014  . COPD with exacerbation ([place]) 04/02/2014  . Hypoxia 04/02/2014  . Cerebral infarction ([place]) 06/01/2012  . Chronic pain (back, legs) 05/30/2012  . Toe laceration, 4th toe 05/30/2012  . C. difficile diarrhea 05/30/2012  . Hypokalemia 05/30/2012  . Acute lacunar stroke ([place]) 05/27/2012  . Type 2 diabetes with nephropathy ([place]) 05/27/2012  . Smoker 05/27/2012  . HTN (hypertension) 05/27/2012   PCP:  [name], MD Pharmacy:   [facility] [place] [address] Phone: [phone] Fax: [phone]  [facility] [place] [place] [address] [place] [address] Phone: [phone] Fax: [phone]     Social Determinants of Health ([place])  Interventions    Readmission Risk Interventions Readmission Risk Prevention Plan 05/21/2019 05/18/2019 04/22/2019  Transportation Screening Complete Complete Complete  PCP or Specialist Appt within 3-5 Days - Complete -  HRI or [place] - Complete -  Social Work Consult for [place] Planning/Counseling - Complete -  Palliative Care Screening - Not Applicable -  Medication Review Press photographer) - Complete Complete  PCP or Specialist appointment within 3-5 days of discharge - - Not Complete  PCP/Specialist Appt Not Complete comments - - SNF MD  HRI or [place] - - Not Complete  HRI or Home Care Consult Pt Refusal Comments - - from SNF  SW Recovery Care/Counseling Consult - - Complete  Palliative Care Screening - - Not [place] - - Complete  Some recent data might be hidden      Final next level of care: [place]     Patient Goals and CMS Choice        Discharge Placement                       Discharge Plan and Services In-house Referral: Clinical Social Work                                   Social Determinants of Health (SDOH) Interventions     Readmission  Risk Interventions Readmission Risk Prevention Plan 05/21/2019 05/18/2019 04/22/2019  Transportation Screening Complete Complete Complete  PCP or Specialist Appt within 3-5 Days - Complete -  HRI or [place] - Complete -  Social Work Consult for [place] Planning/Counseling - Complete -  Palliative Care Screening - Not Applicable -  Medication Review Press photographer) - Complete Complete  PCP or Specialist appointment within 3-5 days of discharge - - Not Complete  PCP/Specialist Appt Not Complete comments - - SNF MD  [place] or [place] - - Not Complete  HRI or Home Care Consult Pt Refusal Comments - - from SNF  SW Recovery Care/Counseling Consult - - Complete  Palliative Care Screening - - Not [place] - - Complete  Some recent data might be hidden

## 2019-11-21 LAB — CULTURE, BLOOD (ROUTINE X 2)
Culture: NO GROWTH
Culture: NO GROWTH
Special Requests: ADEQUATE
Special Requests: ADEQUATE

## 2019-12-18 ENCOUNTER — Other Ambulatory Visit: Payer: Self-pay

## 2019-12-18 ENCOUNTER — Inpatient Hospital Stay (HOSPITAL_COMMUNITY)
Admission: EM | Admit: 2019-12-18 | Discharge: 2019-12-21 | DRG: 291 | Disposition: A | Discharge status: Skilled Nursing Facility | Payer: Medicare (Managed Care) | Source: Skilled Nursing Facility | Attending: Internal Medicine | Admitting: Internal Medicine

## 2019-12-18 ENCOUNTER — Encounter (HOSPITAL_COMMUNITY): Payer: Self-pay | Admitting: *Deleted

## 2019-12-18 ENCOUNTER — Emergency Department (HOSPITAL_COMMUNITY): Payer: Medicare (Managed Care)

## 2019-12-18 DIAGNOSIS — J189 Pneumonia, unspecified organism: Secondary | ICD-10-CM | POA: Diagnosis present

## 2019-12-18 DIAGNOSIS — Z794 Long term (current) use of insulin: Secondary | ICD-10-CM | POA: Diagnosis not present

## 2019-12-18 DIAGNOSIS — K219 Gastro-esophageal reflux disease without esophagitis: Secondary | ICD-10-CM | POA: Diagnosis present

## 2019-12-18 DIAGNOSIS — E1121 Type 2 diabetes mellitus with diabetic nephropathy: Secondary | ICD-10-CM

## 2019-12-18 DIAGNOSIS — F419 Anxiety disorder, unspecified: Secondary | ICD-10-CM | POA: Diagnosis present

## 2019-12-18 DIAGNOSIS — E7800 Pure hypercholesterolemia, unspecified: Secondary | ICD-10-CM | POA: Diagnosis present

## 2019-12-18 DIAGNOSIS — E785 Hyperlipidemia, unspecified: Secondary | ICD-10-CM | POA: Diagnosis present

## 2019-12-18 DIAGNOSIS — G894 Chronic pain syndrome: Secondary | ICD-10-CM | POA: Diagnosis present

## 2019-12-18 DIAGNOSIS — J441 Chronic obstructive pulmonary disease with (acute) exacerbation: Secondary | ICD-10-CM | POA: Diagnosis present

## 2019-12-18 DIAGNOSIS — E1142 Type 2 diabetes mellitus with diabetic polyneuropathy: Secondary | ICD-10-CM | POA: Diagnosis present

## 2019-12-18 DIAGNOSIS — I5033 Acute on chronic diastolic (congestive) heart failure: Secondary | ICD-10-CM | POA: Diagnosis present

## 2019-12-18 DIAGNOSIS — Z7982 Long term (current) use of aspirin: Secondary | ICD-10-CM | POA: Diagnosis not present

## 2019-12-18 DIAGNOSIS — J440 Chronic obstructive pulmonary disease with acute lower respiratory infection: Secondary | ICD-10-CM | POA: Diagnosis present

## 2019-12-18 DIAGNOSIS — Z8673 Personal history of transient ischemic attack (TIA), and cerebral infarction without residual deficits: Secondary | ICD-10-CM

## 2019-12-18 DIAGNOSIS — E1122 Type 2 diabetes mellitus with diabetic chronic kidney disease: Secondary | ICD-10-CM

## 2019-12-18 DIAGNOSIS — J9621 Acute and chronic respiratory failure with hypoxia: Secondary | ICD-10-CM | POA: Diagnosis present

## 2019-12-18 DIAGNOSIS — Z6841 Body Mass Index (BMI) 40.0 and over, adult: Secondary | ICD-10-CM

## 2019-12-18 DIAGNOSIS — Z86718 Personal history of other venous thrombosis and embolism: Secondary | ICD-10-CM | POA: Diagnosis not present

## 2019-12-18 DIAGNOSIS — F329 Major depressive disorder, single episode, unspecified: Secondary | ICD-10-CM | POA: Diagnosis present

## 2019-12-18 DIAGNOSIS — N1832 Chronic kidney disease, stage 3b: Secondary | ICD-10-CM | POA: Diagnosis not present

## 2019-12-18 DIAGNOSIS — Z7951 Long term (current) use of inhaled steroids: Secondary | ICD-10-CM

## 2019-12-18 DIAGNOSIS — Z20822 Contact with and (suspected) exposure to covid-19: Secondary | ICD-10-CM | POA: Diagnosis present

## 2019-12-18 DIAGNOSIS — F17210 Nicotine dependence, cigarettes, uncomplicated: Secondary | ICD-10-CM | POA: Diagnosis present

## 2019-12-18 DIAGNOSIS — I130 Hypertensive heart and chronic kidney disease with heart failure and stage 1 through stage 4 chronic kidney disease, or unspecified chronic kidney disease: Secondary | ICD-10-CM | POA: Diagnosis present

## 2019-12-18 DIAGNOSIS — E1151 Type 2 diabetes mellitus with diabetic peripheral angiopathy without gangrene: Secondary | ICD-10-CM | POA: Diagnosis present

## 2019-12-18 DIAGNOSIS — N184 Chronic kidney disease, stage 4 (severe): Secondary | ICD-10-CM | POA: Diagnosis present

## 2019-12-18 DIAGNOSIS — R0902 Hypoxemia: Secondary | ICD-10-CM

## 2019-12-18 DIAGNOSIS — E1165 Type 2 diabetes mellitus with hyperglycemia: Secondary | ICD-10-CM | POA: Diagnosis present

## 2019-12-18 DIAGNOSIS — D631 Anemia in chronic kidney disease: Secondary | ICD-10-CM | POA: Diagnosis present

## 2019-12-18 DIAGNOSIS — M109 Gout, unspecified: Secondary | ICD-10-CM | POA: Diagnosis present

## 2019-12-18 DIAGNOSIS — Z79899 Other long term (current) drug therapy: Secondary | ICD-10-CM | POA: Diagnosis not present

## 2019-12-18 HISTORY — DX: COVID-19: U07.1

## 2019-12-18 HISTORY — DX: Iron deficiency anemia, unspecified: D50.9

## 2019-12-18 LAB — COMPREHENSIVE METABOLIC PANEL
ALT: 7 U/L (ref 0–44)
AST: 8 U/L — ABNORMAL LOW (ref 15–41)
Albumin: 3.5 g/dL (ref 3.5–5.0)
Alkaline Phosphatase: 69 U/L (ref 38–126)
Anion gap: 11 (ref 5–15)
BUN: 51 mg/dL — ABNORMAL HIGH (ref 8–23)
CO2: 27 mmol/L (ref 22–32)
Calcium: 8.7 mg/dL — ABNORMAL LOW (ref 8.9–10.3)
Chloride: 100 mmol/L (ref 98–111)
Creatinine, Ser: 3.96 mg/dL — ABNORMAL HIGH (ref 0.61–1.24)
GFR calc Af Amer: 17 mL/min — ABNORMAL LOW (ref >60)
GFR calc non Af Amer: 14 mL/min — ABNORMAL LOW (ref >60)
Glucose, Bld: 243 mg/dL — ABNORMAL HIGH (ref 70–99)
Potassium: 4.2 mmol/L (ref 3.5–5.1)
Sodium: 138 mmol/L (ref 135–145)
Total Bilirubin: 0.7 mg/dL (ref 0.3–1.2)
Total Protein: 7.4 g/dL (ref 6.5–8.1)

## 2019-12-18 LAB — CBC WITH DIFFERENTIAL/PLATELET
Abs Immature Granulocytes: 0.07 10*3/uL (ref 0.00–0.07)
Basophils Absolute: 0 10*3/uL (ref 0.0–0.1)
Basophils Relative: 0 %
Eosinophils Absolute: 0.3 10*3/uL (ref 0.0–0.5)
Eosinophils Relative: 4 %
HCT: 27.1 % — ABNORMAL LOW (ref 39.0–52.0)
Hemoglobin: 8.1 g/dL — ABNORMAL LOW (ref 13.0–17.0)
Immature Granulocytes: 1 %
Lymphocytes Relative: 7 %
Lymphs Abs: 0.6 10*3/uL — ABNORMAL LOW (ref 0.7–4.0)
MCH: 27.5 pg (ref 26.0–34.0)
MCHC: 29.9 g/dL — ABNORMAL LOW (ref 30.0–36.0)
MCV: 91.9 fL (ref 80.0–100.0)
Monocytes Absolute: 0.5 10*3/uL (ref 0.1–1.0)
Monocytes Relative: 5 %
Neutro Abs: 7.2 10*3/uL (ref 1.7–7.7)
Neutrophils Relative %: 83 %
Platelets: 208 10*3/uL (ref 150–400)
RBC: 2.95 MIL/uL — ABNORMAL LOW (ref 4.22–5.81)
RDW: 18 % — ABNORMAL HIGH (ref 11.5–15.5)
WBC: 8.6 10*3/uL (ref 4.0–10.5)
nRBC: 0 % (ref 0.0–0.2)

## 2019-12-18 LAB — LACTIC ACID, PLASMA
Lactic Acid, Venous: 1.8 mmol/L (ref 0.5–1.9)
Lactic Acid, Venous: 1.4 mmol/L (ref 0.5–1.9)

## 2019-12-18 LAB — TSH: TSH: 2.477 u[IU]/mL (ref 0.350–4.500)

## 2019-12-18 LAB — GLUCOSE, CAPILLARY: Glucose-Capillary: 260 mg/dL — ABNORMAL HIGH (ref 70–99)

## 2019-12-18 LAB — MAGNESIUM: Magnesium: 2.1 mg/dL (ref 1.7–2.4)

## 2019-12-18 LAB — SARS CORONAVIRUS 2 BY RT PCR (HOSPITAL ORDER, PERFORMED IN ~~LOC~~ HOSPITAL LAB): SARS Coronavirus 2: NEGATIVE

## 2019-12-18 LAB — TROPONIN I (HIGH SENSITIVITY)
Troponin I (High Sensitivity): 8 ng/L (ref <18)
Troponin I (High Sensitivity): 11 ng/L (ref <18)

## 2019-12-18 LAB — HIV ANTIBODY (ROUTINE TESTING W REFLEX): HIV Screen 4th Generation wRfx: NONREACTIVE

## 2019-12-18 LAB — BRAIN NATRIURETIC PEPTIDE: B Natriuretic Peptide: 295 pg/mL — ABNORMAL HIGH (ref 0.0–100.0)

## 2019-12-18 LAB — STREP PNEUMONIAE URINARY ANTIGEN: Strep Pneumo Urinary Antigen: NEGATIVE

## 2019-12-18 LAB — MRSA PCR SCREENING: MRSA by PCR: NEGATIVE

## 2019-12-18 MED ORDER — LIDOCAINE 5 % EX PTCH
1.0000 | MEDICATED_PATCH | Freq: Every day | CUTANEOUS | Status: DC
  Filled 2019-12-18 (×5): qty 1

## 2019-12-18 MED ORDER — OMEGA-3-ACID ETHYL ESTERS 1 G PO CAPS
1.0000 g | ORAL_CAPSULE | Freq: Two times a day (BID) | ORAL | Status: DC
  Administered 2019-12-18 – 2019-12-21 (×6): 1 g via ORAL
  Filled 2019-12-18 (×6): qty 1

## 2019-12-18 MED ORDER — ALLOPURINOL 100 MG PO TABS
100.0000 mg | ORAL_TABLET | Freq: Every day | ORAL | Status: DC
  Administered 2019-12-19 – 2019-12-21 (×3): 100 mg via ORAL
  Filled 2019-12-18 (×3): qty 1

## 2019-12-18 MED ORDER — DM-GUAIFENESIN ER 30-600 MG PO TB12
1.0000 | ORAL_TABLET | Freq: Two times a day (BID) | ORAL | Status: DC
  Administered 2019-12-18 – 2019-12-21 (×6): 1 via ORAL
  Filled 2019-12-18 (×6): qty 1

## 2019-12-18 MED ORDER — OMEGA-3 FATTY ACIDS 1000 MG PO CAPS: 1.0000 g | ORAL_CAPSULE | Freq: Two times a day (BID) | ORAL | Status: DC

## 2019-12-18 MED ORDER — PANTOPRAZOLE SODIUM 40 MG PO TBEC
40.0000 mg | DELAYED_RELEASE_TABLET | Freq: Every day | ORAL | Status: DC
  Administered 2019-12-19 – 2019-12-21 (×3): 40 mg via ORAL
  Filled 2019-12-18 (×3): qty 1

## 2019-12-18 MED ORDER — POLYETHYLENE GLYCOL 3350 17 G PO PACK
17.0000 g | PACK | Freq: Every day | ORAL | Status: DC
  Filled 2019-12-18 (×2): qty 1

## 2019-12-18 MED ORDER — HYDROCODONE-ACETAMINOPHEN 5-325 MG PO TABS
1.0000 | ORAL_TABLET | Freq: Three times a day (TID) | ORAL | Status: DC | PRN
  Administered 2019-12-19: 1 via ORAL
  Filled 2019-12-18: qty 1

## 2019-12-18 MED ORDER — INSULIN ASPART 100 UNIT/ML ~~LOC~~ SOLN
0.0000 [IU] | Freq: Every day | SUBCUTANEOUS | Status: DC
  Administered 2019-12-18 – 2019-12-20 (×3): 3 [IU] via SUBCUTANEOUS

## 2019-12-18 MED ORDER — ALBUTEROL SULFATE HFA 108 (90 BASE) MCG/ACT IN AERS
INHALATION_SPRAY | RESPIRATORY_TRACT | Status: AC
  Filled 2019-12-18: qty 6.7

## 2019-12-18 MED ORDER — TAMSULOSIN HCL 0.4 MG PO CAPS
0.4000 mg | ORAL_CAPSULE | Freq: Every day | ORAL | Status: DC
  Administered 2019-12-18 – 2019-12-20 (×3): 0.4 mg via ORAL
  Filled 2019-12-18 (×3): qty 1

## 2019-12-18 MED ORDER — SODIUM CHLORIDE 0.9 % IV SOLN
2.0000 g | INTRAVENOUS | Status: DC
  Administered 2019-12-18 – 2019-12-19 (×2): 2 g via INTRAVENOUS
  Filled 2019-12-18 (×2): qty 20

## 2019-12-18 MED ORDER — SODIUM CHLORIDE 0.9% FLUSH
3.0000 mL | Freq: Two times a day (BID) | INTRAVENOUS | Status: DC
  Administered 2019-12-18 – 2019-12-21 (×5): 3 mL via INTRAVENOUS

## 2019-12-18 MED ORDER — HEPARIN SODIUM (PORCINE) 5000 UNIT/ML IJ SOLN
5000.0000 [IU] | Freq: Three times a day (TID) | INTRAMUSCULAR | Status: DC
  Administered 2019-12-18 – 2019-12-21 (×9): 5000 [IU] via SUBCUTANEOUS
  Filled 2019-12-18 (×9): qty 1

## 2019-12-18 MED ORDER — METOPROLOL SUCCINATE ER 50 MG PO TB24
50.0000 mg | ORAL_TABLET | Freq: Every day | ORAL | Status: DC
  Administered 2019-12-19 – 2019-12-21 (×3): 50 mg via ORAL
  Filled 2019-12-18 (×3): qty 1

## 2019-12-18 MED ORDER — ADULT MULTIVITAMIN W/MINERALS CH
1.0000 | ORAL_TABLET | Freq: Every day | ORAL | Status: DC
  Administered 2019-12-19 – 2019-12-21 (×3): 1 via ORAL
  Filled 2019-12-18 (×3): qty 1

## 2019-12-18 MED ORDER — METHYLPREDNISOLONE SODIUM SUCC 125 MG IJ SOLR
125.0000 mg | Freq: Once | INTRAMUSCULAR | Status: AC
  Administered 2019-12-18: 125 mg via INTRAVENOUS
  Filled 2019-12-18: qty 2

## 2019-12-18 MED ORDER — AMLODIPINE BESYLATE 5 MG PO TABS
10.0000 mg | ORAL_TABLET | Freq: Every day | ORAL | Status: DC
  Administered 2019-12-19 – 2019-12-21 (×3): 10 mg via ORAL
  Filled 2019-12-18 (×3): qty 2

## 2019-12-18 MED ORDER — INSULIN ASPART 100 UNIT/ML ~~LOC~~ SOLN
0.0000 [IU] | Freq: Three times a day (TID) | SUBCUTANEOUS | Status: DC
  Administered 2019-12-18: 11 [IU] via SUBCUTANEOUS
  Administered 2019-12-19 (×2): 7 [IU] via SUBCUTANEOUS
  Administered 2019-12-19: 4 [IU] via SUBCUTANEOUS
  Administered 2019-12-20: 11 [IU] via SUBCUTANEOUS
  Administered 2019-12-20: 3 [IU] via SUBCUTANEOUS
  Administered 2019-12-20: 11 [IU] via SUBCUTANEOUS
  Administered 2019-12-21 (×2): 4 [IU] via SUBCUTANEOUS

## 2019-12-18 MED ORDER — SODIUM CHLORIDE 0.9% FLUSH: 3.0000 mL | INTRAVENOUS | Status: DC | PRN

## 2019-12-18 MED ORDER — SODIUM CHLORIDE 0.9 % IV SOLN
250.0000 mL | INTRAVENOUS | Status: DC | PRN
  Administered 2019-12-18: 250 mL via INTRAVENOUS

## 2019-12-18 MED ORDER — GABAPENTIN 300 MG PO CAPS
300.0000 mg | ORAL_CAPSULE | Freq: Every day | ORAL | Status: DC
  Administered 2019-12-18 – 2019-12-20 (×3): 300 mg via ORAL
  Filled 2019-12-18 (×3): qty 1

## 2019-12-18 MED ORDER — FUROSEMIDE 10 MG/ML IJ SOLN
40.0000 mg | Freq: Two times a day (BID) | INTRAMUSCULAR | Status: DC
  Administered 2019-12-18 – 2019-12-21 (×6): 40 mg via INTRAVENOUS
  Filled 2019-12-18 (×6): qty 4

## 2019-12-18 MED ORDER — ALBUTEROL SULFATE (2.5 MG/3ML) 0.083% IN NEBU: 2.5000 mg | INHALATION_SOLUTION | RESPIRATORY_TRACT | Status: DC | PRN

## 2019-12-18 MED ORDER — ONDANSETRON HCL 4 MG/2ML IJ SOLN: 4.0000 mg | Freq: Four times a day (QID) | INTRAMUSCULAR | Status: DC | PRN

## 2019-12-18 MED ORDER — IPRATROPIUM-ALBUTEROL 0.5-2.5 (3) MG/3ML IN SOLN
3.0000 mL | Freq: Four times a day (QID) | RESPIRATORY_TRACT | Status: DC
  Administered 2019-12-19 – 2019-12-21 (×10): 3 mL via RESPIRATORY_TRACT
  Filled 2019-12-18 (×9): qty 3

## 2019-12-18 MED ORDER — FLUOXETINE HCL 20 MG PO CAPS
20.0000 mg | ORAL_CAPSULE | Freq: Every day | ORAL | Status: DC
  Administered 2019-12-19 – 2019-12-21 (×3): 20 mg via ORAL
  Filled 2019-12-18 (×3): qty 1

## 2019-12-18 MED ORDER — VANCOMYCIN HCL IN DEXTROSE 1-5 GM/200ML-% IV SOLN
1000.0000 mg | Freq: Once | INTRAVENOUS | Status: AC
  Administered 2019-12-18: 1000 mg via INTRAVENOUS
  Filled 2019-12-18: qty 200

## 2019-12-18 MED ORDER — METHYLPREDNISOLONE SODIUM SUCC 40 MG IJ SOLR
40.0000 mg | Freq: Three times a day (TID) | INTRAMUSCULAR | Status: DC
  Administered 2019-12-18 – 2019-12-20 (×6): 40 mg via INTRAVENOUS
  Filled 2019-12-18 (×6): qty 1

## 2019-12-18 MED ORDER — ASPIRIN EC 81 MG PO TBEC
81.0000 mg | DELAYED_RELEASE_TABLET | Freq: Every day | ORAL | Status: DC
  Administered 2019-12-19 – 2019-12-21 (×3): 81 mg via ORAL
  Filled 2019-12-18 (×3): qty 1

## 2019-12-18 MED ORDER — BUDESONIDE 0.5 MG/2ML IN SUSP
0.5000 mg | Freq: Two times a day (BID) | RESPIRATORY_TRACT | Status: DC
  Administered 2019-12-18 – 2019-12-21 (×6): 0.5 mg via RESPIRATORY_TRACT
  Filled 2019-12-18 (×6): qty 2

## 2019-12-18 MED ORDER — MAGNESIUM OXIDE 400 (241.3 MG) MG PO TABS
400.0000 mg | ORAL_TABLET | Freq: Every day | ORAL | Status: DC
  Administered 2019-12-19 – 2019-12-21 (×3): 400 mg via ORAL
  Filled 2019-12-18 (×6): qty 1

## 2019-12-18 MED ORDER — ALBUTEROL SULFATE (2.5 MG/3ML) 0.083% IN NEBU
INHALATION_SOLUTION | RESPIRATORY_TRACT | Status: AC
  Filled 2019-12-18: qty 3

## 2019-12-18 MED ORDER — ALPRAZOLAM 0.5 MG PO TABS
0.5000 mg | ORAL_TABLET | Freq: Every evening | ORAL | Status: DC | PRN
  Administered 2019-12-19: 0.5 mg via ORAL
  Filled 2019-12-18: qty 1

## 2019-12-18 MED ORDER — INSULIN DETEMIR 100 UNIT/ML ~~LOC~~ SOLN
20.0000 [IU] | Freq: Every day | SUBCUTANEOUS | Status: DC
  Administered 2019-12-18: 20 [IU] via SUBCUTANEOUS
  Filled 2019-12-18 (×3): qty 0.2

## 2019-12-18 MED ORDER — ACETAMINOPHEN 325 MG PO TABS
650.0000 mg | ORAL_TABLET | ORAL | Status: DC | PRN
  Administered 2019-12-19: 650 mg via ORAL
  Filled 2019-12-18: qty 2

## 2019-12-18 MED ORDER — ALBUTEROL SULFATE HFA 108 (90 BASE) MCG/ACT IN AERS: 6.0000 | INHALATION_SPRAY | Freq: Once | RESPIRATORY_TRACT | Status: AC

## 2019-12-18 MED ORDER — FUROSEMIDE 10 MG/ML IJ SOLN
80.0000 mg | Freq: Once | INTRAMUSCULAR | Status: AC
  Administered 2019-12-18: 80 mg via INTRAVENOUS
  Filled 2019-12-18: qty 8

## 2019-12-18 MED ORDER — SODIUM CHLORIDE 0.9 % IV SOLN
500.0000 mg | INTRAVENOUS | Status: DC
  Administered 2019-12-18 – 2019-12-19 (×2): 500 mg via INTRAVENOUS
  Filled 2019-12-18 (×2): qty 500

## 2019-12-18 MED ORDER — PIPERACILLIN-TAZOBACTAM 3.375 G IVPB 30 MIN
3.3750 g | Freq: Once | INTRAVENOUS | Status: AC
  Administered 2019-12-18: 3.375 g via INTRAVENOUS
  Filled 2019-12-18: qty 50

## 2019-12-18 MED ORDER — CALCIUM CARBONATE ANTACID 500 MG PO CHEW
2.0000 | CHEWABLE_TABLET | Freq: Two times a day (BID) | ORAL | Status: DC
  Administered 2019-12-18 – 2019-12-21 (×6): 400 mg via ORAL
  Filled 2019-12-18 (×6): qty 2

## 2019-12-18 MED ORDER — CALCITRIOL 0.25 MCG PO CAPS
0.2500 ug | ORAL_CAPSULE | Freq: Every day | ORAL | Status: DC
  Administered 2019-12-19 – 2019-12-21 (×3): 0.25 ug via ORAL
  Filled 2019-12-18 (×3): qty 1

## 2019-12-18 NOTE — ED Notes (Signed)
500 ml of urine.

## 2019-12-18 NOTE — ED Provider Notes (Signed)
[facility] EMERGENCY DEPARTMENT Provider Note   CSN: [id number] Arrival date & time: 12/18/19  1124     History Chief Complaint  Patient presents with  . Shortness of Breath    Eric Scott is a 69 y.o. male.  Patient complains of shortness of breath.  Patient has a history of congestive heart failure and COPD and is on a couple liters nasally in the nursing home.  He is requiring more oxygen than that here in the emergency department  The history is provided by the patient and medical records. No language interpreter was used.  Shortness of Breath Severity:  Moderate Onset quality:  Sudden Timing:  Constant Progression:  Worsening Chronicity:  New Context: activity   Relieved by:  Nothing Worsened by:  Nothing Ineffective treatments:  None tried Associated symptoms: no abdominal pain, no chest pain, no cough, no headaches and no rash        Past Medical History:  Diagnosis Date  . Anemia   . Anxiety   . Chronic pain    legs, back; MRI 05/2012 with mild thoracic degenerative changes no spinal stenosis   . 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 COPD (chronic obstructive pulmonary disease) ([place])   . COVID-19   . Depression   . Diabetic peripheral neuropathy ([place])    "chronic" (05/27/2012)  . Diastolic CHF ([place])   . Duodenal ulcer hemorrhage   . DVT (deep venous thrombosis) ([name])    right upper arm  . GERD (gastroesophageal reflux disease)   . Hypercholesteremia   . Hypertension   . Iron deficiency anemia   . 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 Peripheral edema   . PVD (peripheral vascular disease) ([place])   . Renal disorder   . Spinal stenosis    mild lumbar (MRI 05/2012)-L2-L3 to L4-L5 , mild lumbar foraminal stenosis   . Stroke ([place]) 05/2012   Subacute, lacunar infarcts within the left basal ganglia and posterior limp of the left internal capsule/thalamus; "RUE; both feet weak" (05/27/2012)  . Tremor   . Type II diabetes mellitus [facility])      Patient Active Problem List   Diagnosis Date Noted  . Acute on chronic respiratory failure with hypoxia ([place]) 12/18/2019  . Respiratory failure with hypoxia ([place]) 11/17/2019  . Normocytic anemia 11/16/2019  . Cellulitis of both lower extremities 11/16/2019  . Chronic respiratory failure with hypoxia ([place]) 05/22/2019  . Leukocytosis 05/22/2019  . Chronic heart failure with preserved ejection fraction (HFpEF) ([place]) 05/22/2019  . Acute respiratory failure ([place]) 05/17/2019  . Obesity, Class III, BMI 40-49.9 (morbid obesity) ([place]) 05/17/2019  . Atrial flutter, paroxysmal ([place]) 05/17/2019  . COPD with acute exacerbation ([place]) 04/20/2019  . GERD (gastroesophageal reflux disease) 08/08/2018  . PUD (peptic ulcer disease)   . Gastric erosions 02/13/2017  . Duodenal ulcer 02/13/2017  . Transaminitis   . UGI bleed   . Arm DVT (deep venous thromboembolism), acute, right ([place]) 12/18/2016  . Uremia 12/10/2016  . Acute renal failure ([place])   . Volume depletion 11/23/2016  . Hypotension   . Liver enzyme elevation   . 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 Arterial insufficiency of lower extremity ([place]) 11/15/2016  . Pressure injury of skin 11/09/2016  . Sepsis ([place]) 11/09/2016  . Osteomyelitis ([place]) 11/09/2016  . Wounds, multiple 11/08/2016  . Chronic venous insufficiency 10/12/2016  . Critical lower limb ischemia 10/12/2016  . Fatty  liver 09/10/2016  . History of colonic polyps 09/10/2016  . Gallstone 09/10/2016  . Edema 03/04/2015  . AKI (acute kidney injury) ([place]) 03/04/2015  . Fall 03/04/2015  . Generalized weakness 03/04/2015  . Diabetic polyneuropathy associated with type 2 diabetes mellitus ([place])   . Anxiety   . PVD (peripheral vascular disease) ([place])   . Pneumonia 07/20/2014  . Weight gain 05/02/2014  . Coarse tremors 04/18/2014  . Anxiety state 04/18/2014  . Acute respiratory failure with hypoxia  ([place]) 04/07/2014  . Acute on chronic diastolic CHF (congestive heart failure) ([place]) 04/07/2014  . Solitary pulmonary nodule 04/07/2014  . COPD with exacerbation ([place]) 04/02/2014  . Hypoxia 04/02/2014  . Cerebral infarction ([place]) 06/01/2012  . Chronic pain (back, legs) 05/30/2012  . Toe laceration, 4th toe 05/30/2012  . C. difficile diarrhea 05/30/2012  . Hypokalemia 05/30/2012  . Acute lacunar stroke ([place]) 05/27/2012  . Type 2 diabetes with nephropathy ([place]) 05/27/2012  . Smoker 05/27/2012  . HTN (hypertension) 05/27/2012    Past Surgical History:  Procedure Laterality Date  . ANKLE SURGERY    . ESOPHAGOGASTRODUODENOSCOPY (EGD) WITH PROPOFOL N/A 12/21/2016   Procedure: ESOPHAGOGASTRODUODENOSCOPY (EGD) WITH PROPOFOL;  Surgeon: Daneil Dolin, MD;  [facility];  Service: Gastroenterology;  Laterality: N/A;  . ESOPHAGOGASTRODUODENOSCOPY (EGD) WITH PROPOFOL N/A 04/02/2017   Procedure: ESOPHAGOGASTRODUODENOSCOPY (EGD) WITH PROPOFOL;  Surgeon: Danie Binder, MD;  [facility];  Service: Endoscopy;  Laterality: N/A;  1030   . HERNIA REPAIR  01/05/2004   "belly button" (05/27/2012)       Family History  Problem Relation Age of Onset  . Colon cancer Neg Hx     Social History   Tobacco Use  . Smoking status: Current Every Day Smoker    Years: 45.00    Types: Cigarettes  . Smokeless tobacco: Never Used  . Tobacco comment: 6 cigarettes a day   Vaping Use  . Vaping Use: Never used  Substance Use Topics  . Alcohol use: No    Comment: 05/27/2012 "drank gallons and gallons 20 yr ago or so; last drink  at least 10 yr ago"  . Drug use: No    Home Medications Prior to Admission medications   Medication Sig Start Date End Date Taking? Authorizing Provider  acetaminophen (TYLENOL) 325 MG tablet Take 2 tablets (650 mg total) by mouth every 6 (six) hours as needed for mild pain, fever or headache (or Fever >/= 101). 11/18/19  Yes Emokpae, Courage, MD  albuterol  (PROVENTIL) (2.5 MG/3ML) 0.083% nebulizer solution Take 2.5 mg by nebulization in the morning and at bedtime.    Yes [provider]  allopurinol (ZYLOPRIM) 100 MG tablet Take 1 tablet by mouth daily. For gout   Yes [provider]  ALPRAZolam (XANAX) 0.5 MG tablet Take 1 tablet (0.5 mg total) by mouth at bedtime as needed for anxiety or sleep. 11/18/19  Yes Emokpae, Courage, MD  amLODipine (NORVASC) 10 MG tablet Take 1 tablet (10 mg total) by mouth daily. 05/21/19  Yes Roxan Hockey, MD  aspirin EC 81 MG tablet Take 1 tablet (81 mg total) by mouth daily with breakfast. 11/18/19  Yes Emokpae, Courage, MD  BREO ELLIPTA 100-25 MCG/INH AEPB Inhale 2 puffs into the lungs daily.  09/27/16  Yes [provider]  calcitRIOL (ROCALTROL) 0.25 MCG capsule Take 0.25 mcg by mouth daily.   Yes [provider]  calcium carbonate (TUMS - DOSED IN MG ELEMENTAL CALCIUM) 500 MG chewable tablet  Chew 2 tablets by mouth 2 (two) times daily.   Yes [provider]  cholecalciferol (VITAMIN D) 1000 units tablet Take 5,000 Units by mouth daily.    Yes [provider]  cyanocobalamin 1000 MCG tablet Take 1,000 mcg by mouth daily.   Yes [provider]  fish oil-omega-3 fatty acids 1000 MG capsule Take 1 g by mouth 2 (two) times daily.   Yes [provider]  FLUoxetine (PROZAC) 20 MG capsule Take 20 mg by mouth daily. 05/12/19  Yes [provider]  gabapentin (NEURONTIN) 400 MG capsule Take 400 mg by mouth 2 (two) times daily.    Yes [provider]  HUMALOG KWIKPEN 100 UNIT/ML KiwkPen Inject 2-14 Units into the skin 2 (two) times daily. Per sliding scale 150-200= 2 units 201-250= 4 units 251-300= 6 units 301-349= 8 units 350-400=10units 401-450=12units 451-500=14units ABOVE 501- CALL MD BID- before meals and at bedtime. 10/07/16  Yes [provider]  HYDROcodone-acetaminophen (NORCO/VICODIN) 5-325 MG tablet Take 1 tablet by  mouth every 12 (twelve) hours as needed for moderate pain or severe pain. 11/18/19  Yes Emokpae, Courage, MD  insulin glargine (LANTUS) 100 UNIT/ML injection Inject 0.32 mLs (32 Units total) into the skin at bedtime. 05/21/19  Yes Emokpae, Courage, MD  lactulose (CHRONULAC) 10 GM/15ML solution Take 15 mLs (10 g total) by mouth See admin instructions. --Please give 59mL [10 g total] every Monday and Friday due to tendency for high ammonia 11/18/19  Yes Emokpae, Courage, MD  Lidocaine 4 % PTCH Apply 1 patch topically daily. For pain   Yes [provider]  magnesium oxide (MAG-OX) 400 MG tablet Take 400 mg by mouth daily.   Yes [provider]  metoprolol succinate (TOPROL-XL) 50 MG 24 hr tablet Take 50 mg by mouth in the morning and at bedtime. Take with or immediately following a meal.   Yes [provider]  Multiple Vitamin (MULTIVITAMIN) tablet Take 1 tablet by mouth daily. For wound healing   Yes [provider]  pantoprazole (PROTONIX) 40 MG tablet Take 1 tablet (40 mg total) by mouth daily before breakfast. 05/21/19 12/18/19 Yes Emokpae, Courage, MD  polyethylene glycol (MIRALAX / GLYCOLAX) 17 g packet Take 17 g by mouth daily.   Yes [provider]  tamsulosin (FLOMAX) 0.4 MG CAPS capsule Take 1 capsule (0.4 mg total) by mouth daily after supper. 11/18/19  Yes Roxan Hockey, MD  torsemide (DEMADEX) 20 MG tablet Take 40 mg (2 tabs) in the morning and 20 mg (1 tab) in the evening) 11/18/19  Yes Emokpae, Courage, MD  hydrOXYzine (ATARAX/VISTARIL) 25 MG tablet Take 1 tablet (25 mg total) by mouth every 12 (twelve) hours as needed for anxiety, itching, nausea or vomiting. Patient not taking: Reported on 12/18/2019 11/18/19   Roxan Hockey, MD  lidocaine (LIDODERM) 5 % Place 1 patch onto the skin 2 (two) times daily. Remove & Discard patch within 12 hours or as directed by MD. Apply to lower back twice daily. Patient not taking: Reported on 12/18/2019     [provider]  metoprolol tartrate (LOPRESSOR) 50 MG tablet Take 1 tablet (50 mg total) by mouth 2 (two) times daily. Patient not taking: Reported on 12/18/2019 05/21/19   Roxan Hockey, MD  predniSONE (DELTASONE) 20 MG tablet Take 2 tablets (40 mg total) by mouth daily with breakfast. Patient not taking: Reported on 12/18/2019 11/18/19   Roxan Hockey, MD    Allergies    Blueberry flavor, Cucumber extract, Flexeril [  cyclobenzaprine hcl], and Kiwi extract  Review of Systems   Review of Systems  Constitutional: Negative for appetite change and fatigue.  HENT: Negative for congestion, ear discharge and sinus pressure.   Eyes: Negative for discharge.  Respiratory: Positive for shortness of breath. Negative for cough.   Cardiovascular: Negative for chest pain.  Gastrointestinal: Negative for abdominal pain and diarrhea.  Genitourinary: Negative for frequency and hematuria.  Musculoskeletal: Negative for back pain.  Skin: Negative for rash.  Neurological: Negative for seizures and headaches.  Psychiatric/Behavioral: Negative for hallucinations.    Physical Exam Updated Vital Signs BP (!) 165/79   Pulse 85   Temp 98 F (36.7 C) (Oral)   Resp 15   Ht 6' (1.829 m)   Wt 136.1 kg   SpO2 100%   BMI 40.69 kg/m   Physical Exam Vitals and nursing note reviewed.  Constitutional:      Appearance: He is well-developed.  HENT:     Head: Normocephalic.     Nose: Nose normal.  Eyes:     General: No scleral icterus.    Conjunctiva/sclera: Conjunctivae normal.  Neck:     Thyroid: No thyromegaly.  Cardiovascular:     Rate and Rhythm: Normal rate and regular rhythm.     Heart sounds: No murmur heard.  No friction rub. No gallop.   Pulmonary:     Breath sounds: No stridor. Wheezing present. No rales.  Chest:     Chest wall: No tenderness.  Abdominal:     General: There is no distension.     Tenderness: There is no abdominal tenderness. There is no rebound.    Musculoskeletal:        General: Normal range of motion.     Cervical back: Neck supple.  Lymphadenopathy:     Cervical: No cervical adenopathy.  Skin:    Findings: No erythema or rash.  Neurological:     Mental Status: He is alert and oriented to person, place, and time.     Motor: No abnormal muscle tone.     Coordination: Coordination normal.  Psychiatric:        Behavior: Behavior normal.     ED Results / Procedures / Treatments   Labs (all labs ordered are listed, but only abnormal results are displayed) Labs Reviewed  CBC WITH DIFFERENTIAL/PLATELET - Abnormal; Notable for the following components:      Result Value   RBC 2.95 (*)    Hemoglobin 8.1 (*)    HCT 27.1 (*)    MCHC 29.9 (*)    RDW 18.0 (*)    Lymphs Abs 0.6 (*)    All other components within normal limits  COMPREHENSIVE METABOLIC PANEL - Abnormal; Notable for the following components:   Glucose, Bld 243 (*)    BUN 51 (*)    Creatinine, Ser 3.96 (*)    Calcium 8.7 (*)    AST 8 (*)    GFR calc non Af Amer 14 (*)    GFR calc Af Amer 17 (*)    All other components within normal limits  BRAIN NATRIURETIC PEPTIDE - Abnormal; Notable for the following components:   B Natriuretic Peptide 295.0 (*)    All other components within normal limits  SARS CORONAVIRUS 2 BY RT PCR (HOSPITAL ORDER, [facility])  LACTIC ACID, PLASMA  LACTIC ACID, PLASMA  TROPONIN I (HIGH SENSITIVITY)  TROPONIN I (HIGH SENSITIVITY)    EKG None  Radiology DG Chest [facility] [address]  Result Date: 12/18/2019 CLINICAL DATA:  Shortness of breath EXAM: PORTABLE CHEST 1 VIEW COMPARISON:  11/17/2019 FINDINGS: Mild subpleural patchy opacities in the lungs bilaterally, right upper and lower lobe predominant, favoring mild multifocal pneumonia. No pleural effusion or pneumothorax. The heart is top-normal in size.  Thoracic aortic atherosclerosis. IMPRESSION: Mild multifocal pneumonia. Electronically Signed   By: Julian Hy M.D.   On: 12/18/2019 12:40    Procedures Procedures (including critical care time)  Medications Ordered in ED Medications  piperacillin-tazobactam (ZOSYN) IVPB 3.375 g (3.375 g Intravenous New Bag/Given 12/18/19 1329)  vancomycin (VANCOCIN) IVPB 1000 mg/200 mL premix (has no administration in time range)  budesonide (PULMICORT) nebulizer solution 0.5 mg (has no administration in time range)  methylPREDNISolone sodium succinate (SOLU-MEDROL) 40 mg/mL injection 40 mg (has no administration in time range)  insulin aspart (novoLOG) injection 0-20 Units (has no administration in time range)  insulin aspart (novoLOG) injection 0-5 Units (has no administration in time range)  insulin detemir (LEVEMIR) injection 20 Units (has no administration in time range)  furosemide (LASIX) injection 80 mg (80 mg Intravenous Given 12/18/19 1234)  albuterol (VENTOLIN HFA) 108 (90 Base) MCG/ACT inhaler 6 puff ( Inhalation Given 12/18/19 1150)  methylPREDNISolone sodium succinate (SOLU-MEDROL) 125 mg/2 mL injection 125 mg (125 mg Intravenous Given 12/18/19 1234)    ED Course  I have reviewed the triage vital signs and the nursing notes.  Pertinent labs & imaging results that were available during my care of the patient were reviewed by me and considered in my medical decision making (see chart for details).    CRITICAL CARE Performed by: Milton Ferguson Total critical care time: 40 minutes Critical care time was exclusive of separately billable procedures and treating other patients. Critical care was necessary to treat or prevent imminent or life-threatening deterioration. Critical care was time spent personally by me on the following activities: development of treatment plan with patient and/or surrogate as well as nursing, discussions with consultants, evaluation of patient's response to treatment, examination of patient, obtaining history from patient or surrogate, ordering and performing treatments  and interventions, ordering and review of laboratory studies, ordering and review of radiographic studies, pulse oximetry and re-evaluation of patient's condition.  MDM Rules/Calculators/A&P                          Patient with bilateral pneumonia and hypoxia.  He will be admitted to the hospital         This patient presents to the ED for concern of shortness of breath, this involves an extensive number of treatment options, and is a complaint that carries with it a high risk of complications and morbidity.  The differential diagnosis includes pneumonia PE   Lab Tests:   I Ordered, reviewed, and interpreted labs, which included CBC chemistries which show anemia kidney failure and elevated glucose  Medicines ordered:   I ordered medication Bank[place] Zosyn for pneumonia  Imaging Studies ordered:   I ordered imaging studies which included chest x-ray and  I independently visualized and interpreted imaging which showed pneumonia  Additional history obtained:   Additional history obtained from record  Previous records obtained and reviewed.  Consultations Obtained:   I consulted hospitalist and discussed lab and imaging findings  Reevaluation:  After the interventions stated above, I reevaluated the patient and found mild improvement  Critical Interventions:  .   Final Clinical Impression(s) / ED Diagnoses Final diagnoses:  Hypoxia  Rx / DC Orders ED Discharge Orders    None       Milton Ferguson, MD 12/19/19 1805

## 2019-12-18 NOTE — TOC Initial Note (Signed)
Transition of Care [facility]) - Initial/Assessment Note   Patient Details  Name: Eric Scott MRN: [id number] Date of Birth: June 05, 1950  [facility]) CM/SW Contact:    Eric Don, LCSW Phone Number: 12/18/2019, 2:15 PM  Clinical Narrative: Patient is a 69 year old male who was admitted for acute on chronic respiratory failure with hypoxia. Patient is a [facility]-term care resident of [facility]. TOC received consult for CHF screening. CSW spoke with patient's son, Eric Scott, to complete assessment. Son reported the patient was terminated from PT services at [facility] as his condition cannot be rehabbed. Son reported patient has a nebulizer and is on home O2. Son reported patient is "not compliant at all" with following a heart healthy diet and restricting his salt and fluid intake. Son reported he was unsure if patient is weighed daily, but the patient is weighed at the facility as the patient told his son about his recent fluid buildup. TOC to follow.  Expected Discharge Plan: [facility] Barriers to Discharge: Continued Medical Work up  Patient Goals and CMS Choice Patient states their goals for this hospitalization and ongoing recovery are:: Return to [place] after discharge CMS Medicare.gov Compare Post Acute Care list provided to:: Patient Represenative (must comment) Choice offered to / list presented to : Adult Children Eric Scott (son))  Expected Discharge Plan and Services Expected Discharge Plan: [facility] In-house Referral: Clinical Social Work Discharge Planning Services: NA Post Acute Care Choice: NA Living arrangements for the past 2 months: [place] ([place])             DME Arranged: N/A DME Agency: NA HH Arranged: NA [place] Agency: NA  Prior Living Arrangements/Services Living arrangements for the past 2 months: [place] ([place]) Lives with:: Facility Resident Patient language and need for  interpreter reviewed:: Yes Do you feel safe going back to the place where you live?: Yes      Need for Family Participation in Patient Care: No (Comment) Care giver support system in place?: Yes (comment) Eric Scott (son)) Current home services: DME (O2 (2L/min); nebulizer) Criminal Activity/Legal Involvement Pertinent to Current Situation/Hospitalization: No - Comment as needed  Permission Sought/Granted Permission sought to share information with : Chartered certified accountant granted to share information with : Yes, Verbal Permission Granted Permission granted to share info w AGENCY: [facility]  Emotional Assessment Appearance:: Appears stated age Alcohol / Substance Use: Tobacco Use Psych Involvement: No (comment)  Admission diagnosis:  Acute on chronic respiratory failure with hypoxia ([place]) [J96.21] Patient Active Problem List   Diagnosis Date Noted  . Acute on chronic respiratory failure with hypoxia ([place]) 12/18/2019  . Respiratory failure with hypoxia ([place]) 11/17/2019  . Normocytic anemia 11/16/2019  . Cellulitis of both lower extremities 11/16/2019  . Chronic respiratory failure with hypoxia ([place]) 05/22/2019  . Leukocytosis 05/22/2019  . Chronic heart failure with preserved ejection fraction (HFpEF) ([place]) 05/22/2019  . Acute respiratory failure ([place]) 05/17/2019  . Obesity, Class III, BMI 40-49.9 (morbid obesity) ([place]) 05/17/2019  . Atrial flutter, paroxysmal ([place]) 05/17/2019  . COPD with acute exacerbation ([place]) 04/20/2019  . GERD (gastroesophageal reflux disease) 08/08/2018  . PUD (peptic ulcer disease)   . Gastric erosions 02/13/2017  . Duodenal ulcer 02/13/2017  . Transaminitis   . UGI bleed   . Arm DVT (deep venous thromboembolism), acute, right ([place]) 12/18/2016  . Uremia 12/10/2016  . Acute renal failure ([place])   . Volume depletion 11/23/2016  .  Hypotension   . Liver enzyme elevation   . Idiopathic chronic venous hypertension of both lower  extremities with ulcer and inflammation ([place]) 11/15/2016  . Arterial insufficiency of lower extremity ([place]) 11/15/2016  . Pressure injury of skin 11/09/2016  . Sepsis ([place]) 11/09/2016  . Osteomyelitis ([place]) 11/09/2016  . Wounds, multiple 11/08/2016  . Chronic venous insufficiency 10/12/2016  . Critical lower limb ischemia 10/12/2016  . Fatty liver 09/10/2016  . History of colonic polyps 09/10/2016  . Gallstone 09/10/2016  . Edema 03/04/2015  . AKI (acute kidney injury) ([place]) 03/04/2015  . Fall 03/04/2015  . Generalized weakness 03/04/2015  . Diabetic polyneuropathy associated with type 2 diabetes mellitus ([place])   . Anxiety   . PVD (peripheral vascular disease) ([place])   . Pneumonia 07/20/2014  . Weight gain 05/02/2014  . Coarse tremors 04/18/2014  . Anxiety state 04/18/2014  . Acute respiratory failure with hypoxia ([place]) 04/07/2014  . Acute on chronic diastolic CHF (congestive heart failure) ([place]) 04/07/2014  . Solitary pulmonary nodule 04/07/2014  . COPD with exacerbation ([place]) 04/02/2014  . Hypoxia 04/02/2014  . Cerebral infarction ([place]) 06/01/2012  . Chronic pain (back, legs) 05/30/2012  . Toe laceration, 4th toe 05/30/2012  . C. difficile diarrhea 05/30/2012  . Hypokalemia 05/30/2012  . Acute lacunar stroke ([place]) 05/27/2012  . Type 2 diabetes with nephropathy ([place]) 05/27/2012  . Smoker 05/27/2012  . HTN (hypertension) 05/27/2012   PCP:  [name], MD Pharmacy:   [facility] [address] [place] [address] Phone: [phone] Fax: [phone]  [facility], [place] [address] [place] [address] Phone: [phone] Fax: [phone]  Readmission Risk Interventions Readmission Risk Prevention Plan 05/21/2019 05/18/2019 04/22/2019  Transportation Screening Complete Complete Complete  PCP or Specialist Appt within 3-5 Days - Complete -  HRI or Home Care Consult - Complete -  Social Work  Consult for [place] Planning/Counseling - Complete -  Palliative Care Screening - Not Applicable -  Medication Review Press photographer) - Complete Complete  PCP or Specialist appointment within 3-5 days of discharge - - Not Complete  PCP/Specialist Appt Not Complete comments - - SNF MD  [place] or [place] - - Not Complete  HRI or Home Care Consult Pt Refusal Comments - - from SNF  SW Recovery Care/Counseling Consult - - Complete  Palliative Care Screening - - Not [place] - - Complete  Some recent data might be hidden

## 2019-12-18 NOTE — ED Triage Notes (Signed)
Pt brought in by [facility] from [facility] with c/o SOB that started this morning per nursing facility. CNA staff that works with him says he actually started to get SOB with exertion 2 days ago. EMS was called out for low oxygen. Pt was on 2L O2 at facility (his normal) and O2 sat was 70% for EMS. Pt was diaphoretic, unable to complete full sentences, rhonchi per EMS. Pt was switched to 6l o2 and pt's O2 sat increased to 90%. Pt was still having difficulty breathing so he was then placed on cipap. ST on monitor. Facility reports he has gained 2lbs in 2 days.

## 2019-12-18 NOTE — H&P (Signed)
History and Physical    Eric Scott:[id number] DOB: 08/05/1950 DOA: 12/18/2019  PCP: [name], MD   Patient coming from: SNF, [facility]  I have personally briefly reviewed patient's old medical records in [place]  Chief Complaint: Shortness of breath  HPI: Eric Scott is a 69 y.o. male with medical history significant of anemia of chronic kidney disease, stage IV-V renal failure, type 2 diabetes with nephropathy/neuropathy, morbid obesity, gastroesophageal reflux disease, hypertension, hyperlipidemia, chronic diastolic heart failure and history of peripheral vascular disease; who presented to the hospital secondary to worsening in his breathing and hypoxia.  Per patient reports symptoms started approximately 48 hours prior to admission and continue progressing.  They had not been any fever, chills, diaphoresis, chest pain, nausea, vomiting, abdominal pain, dysuria, hematuria, melena, hematochezia or new focal deficits.  He chronically uses 2-3 L nasal supplementation at baseline 24/7.  He expressed an increase wheezing and intermittent nonproductive coughing spells, bronchodilators at home failed to improve his symptoms.  On the day of admission while wearing his usual oxygen supplementation he experienced significant worsening in his breathing and EMS was contacted; on their arrival oxygen saturation was in the mid 70s.    ED Course: Patient work-up demonstrating elevated BNP, fluid overload on examination and positive wheezing/crackles on exam.  Chest x-ray with vascular congestion and multifocal infiltrates suggesting CHF versus multifocal pneumonia.  Cultures taken, IV antibiotics started, nebulizer management and steroids provided.  Patient is negative for Covid.  Patient was able to be weaned off CPAP, but requiring 9 L high flow nasal cannula supplementation.  TRH has been contacted today patient for further evaluation and management of acute on chronic respiratory failure  with hypoxia.  Review of Systems: As per HPI otherwise all other systems reviewed and are negative.  Past Medical History:  Diagnosis Date  . Anemia   . Anxiety   . Chronic pain    legs, back; MRI 05/2012 with mild thoracic degenerative changes no spinal stenosis   . 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 COPD (chronic obstructive pulmonary disease) ([place])   . COVID-19   . Depression   . Diabetic peripheral neuropathy ([place])    "chronic" (05/27/2012)  . Diastolic CHF ([place])   . Duodenal ulcer hemorrhage   . DVT (deep venous thrombosis) ([name])    right upper arm  . GERD (gastroesophageal reflux disease)   . Hypercholesteremia   . Hypertension   . Iron deficiency anemia   . Osteomyelitis of ankle ([place])   . Peripheral edema   . PVD (peripheral vascular disease) ([place])   . Renal disorder   . Spinal stenosis    mild lumbar (MRI 05/2012)-L2-L3 to L4-L5 , mild lumbar foraminal stenosis   . Stroke ([place]) 05/2012   Subacute, lacunar infarcts within the left basal ganglia and posterior limp of the left internal capsule/thalamus; "RUE; both feet weak" (05/27/2012)  . Tremor   . Type II diabetes mellitus ([place])     Past Surgical History:  Procedure Laterality Date  . ANKLE SURGERY    . ESOPHAGOGASTRODUODENOSCOPY (EGD) WITH PROPOFOL N/A 12/21/2016   Procedure: ESOPHAGOGASTRODUODENOSCOPY (EGD) WITH PROPOFOL;  Surgeon: Daneil Dolin, MD;  [facility];  Service: Gastroenterology;  Laterality: N/A;  . ESOPHAGOGASTRODUODENOSCOPY (EGD) WITH PROPOFOL N/A 04/02/2017   Procedure: ESOPHAGOGASTRODUODENOSCOPY (EGD) WITH PROPOFOL;  Surgeon: Danie Binder, MD;  Location: [facility];  Service: Endoscopy;  Laterality: N/A;  1030   . HERNIA  REPAIR  01/05/2004   "belly button" (05/27/2012)    Social History  reports that he has been smoking cigarettes. He has smoked for the past 45.00 years. He has never used smokeless tobacco. He reports that he does not drink alcohol and  does not use drugs.  Allergies  Allergen Reactions  . Blueberry Flavor Other (See Comments)    Unknown  . Cucumber Extract Other (See Comments)    "Fells like I'm having a heart attack"  . Flexeril [Cyclobenzaprine Hcl] Other (See Comments)    "whole body  Tremors" (05/27/2012)  . Kiwi Extract Other (See Comments)    "feels like I'm having a heart attack"    Family History  Problem Relation Age of Onset  . Colon cancer Neg Hx     Prior to Admission medications   Medication Sig Start Date End Date Taking? Authorizing Provider  acetaminophen (TYLENOL) 325 MG tablet Take 2 tablets (650 mg total) by mouth every 6 (six) hours as needed for mild pain, fever or headache (or Fever >/= 101). 11/18/19  Yes Emokpae, Courage, MD  albuterol (PROVENTIL) (2.5 MG/3ML) 0.083% nebulizer solution Take 2.5 mg by nebulization in the morning and at bedtime.    Yes [provider]  allopurinol (ZYLOPRIM) 100 MG tablet Take 1 tablet by mouth daily. For gout   Yes [provider]  ALPRAZolam (XANAX) 0.5 MG tablet Take 1 tablet (0.5 mg total) by mouth at bedtime as needed for anxiety or sleep. 11/18/19  Yes Emokpae, Courage, MD  amLODipine (NORVASC) 10 MG tablet Take 1 tablet (10 mg total) by mouth daily. 05/21/19  Yes Roxan Hockey, MD  aspirin EC 81 MG tablet Take 1 tablet (81 mg total) by mouth daily with breakfast. 11/18/19  Yes Emokpae, Courage, MD  BREO ELLIPTA 100-25 MCG/INH AEPB Inhale 2 puffs into the lungs daily.  09/27/16  Yes [provider]  calcitRIOL (ROCALTROL) 0.25 MCG capsule Take 0.25 mcg by mouth daily.   Yes [provider]  calcium carbonate (TUMS - DOSED IN MG ELEMENTAL CALCIUM) 500 MG chewable tablet Chew 2 tablets by mouth 2 (two) times daily.   Yes [provider]  cholecalciferol (VITAMIN D) 1000 units tablet Take 5,000 Units by mouth daily.    Yes [provider]  cyanocobalamin 1000 MCG tablet Take 1,000 mcg by mouth daily.   Yes  [provider]  fish oil-omega-3 fatty acids 1000 MG capsule Take 1 g by mouth 2 (two) times daily.   Yes [provider]  FLUoxetine (PROZAC) 20 MG capsule Take 20 mg by mouth daily. 05/12/19  Yes [provider]  gabapentin (NEURONTIN) 400 MG capsule Take 400 mg by mouth 2 (two) times daily.    Yes [provider]  HYDROcodone-acetaminophen (NORCO/VICODIN) 5-325 MG tablet Take 1 tablet by mouth every 12 (twelve) hours as needed for moderate pain or severe pain. Patient taking differently: Take 1 tablet by mouth every 6 (six) hours as needed for moderate pain or severe pain.  11/18/19  Yes Emokpae, Courage, MD  insulin aspart (NOVOLOG) 100 UNIT/ML injection Inject 2-14 Units into the skin 3 (three) times daily before meals. Sliding scale 150-200=2units, 201-250=4units, 251-300=6units, 301-349=8units, 350-400=10 units, 401-450=12units, 451-500=14units before meals and at betime   Yes [provider]  insulin glargine (LANTUS) 100 UNIT/ML injection Inject 0.32 mLs (32 Units total) into the skin at bedtime. 05/21/19  Yes Emokpae, Courage, MD  lactulose (CHRONULAC) 10 GM/15ML solution Take 15 mLs (10 g total) by  mouth See admin instructions. --Please give 29mL [10 g total] every Monday and Friday due to tendency for high ammonia 11/18/19  Yes Emokpae, Courage, MD  Lidocaine 4 % PTCH Apply 1 patch topically daily. For pain   Yes [provider]  magnesium oxide (MAG-OX) 400 MG tablet Take 400 mg by mouth daily.   Yes [provider]  metoprolol succinate (TOPROL-XL) 50 MG 24 hr tablet Take 50 mg by mouth in the morning and at bedtime. Take with or immediately following a meal.   Yes [provider]  Multiple Vitamin (MULTIVITAMIN) tablet Take 1 tablet by mouth daily. For wound healing   Yes [provider]  pantoprazole (PROTONIX) 40 MG tablet Take 1 tablet (40 mg total) by mouth daily before breakfast. 05/21/19 12/18/19 Yes  Emokpae, Courage, MD  polyethylene glycol (MIRALAX / GLYCOLAX) 17 g packet Take 17 g by mouth daily. For constipation   Yes [provider]  tamsulosin (FLOMAX) 0.4 MG CAPS capsule Take 1 capsule (0.4 mg total) by mouth daily after supper. 11/18/19  Yes Roxan Hockey, MD  torsemide (DEMADEX) 20 MG tablet Take 40 mg (2 tabs) in the morning and 20 mg (1 tab) in the evening) 11/18/19  Yes Emokpae, Courage, MD  HUMALOG KWIKPEN 100 UNIT/ML KiwkPen Inject 2-14 Units into the skin 2 (two) times daily. Per sliding scale 150-200= 2 units 201-250= 4 units 251-300= 6 units 301-349= 8 units 350-400=10units 401-450=12units 451-500=14units ABOVE 501- CALL MD BID- before meals and at bedtime. Patient not taking: Reported on 12/18/2019 10/07/16   [provider]  hydrOXYzine (ATARAX/VISTARIL) 25 MG tablet Take 1 tablet (25 mg total) by mouth every 12 (twelve) hours as needed for anxiety, itching, nausea or vomiting. Patient not taking: Reported on 12/18/2019 11/18/19   Roxan Hockey, MD  lidocaine (LIDODERM) 5 % Place 1 patch onto the skin 2 (two) times daily. Remove & Discard patch within 12 hours or as directed by MD. Apply to lower back twice daily. Patient not taking: Reported on 12/18/2019    [provider]  metoprolol tartrate (LOPRESSOR) 50 MG tablet Take 1 tablet (50 mg total) by mouth 2 (two) times daily. Patient not taking: Reported on 12/18/2019 05/21/19   Roxan Hockey, MD  predniSONE (DELTASONE) 20 MG tablet Take 2 tablets (40 mg total) by mouth daily with breakfast. Patient not taking: Reported on 12/18/2019 11/18/19   Roxan Hockey, MD    Physical Exam: Vitals:   12/18/19 1554 12/18/19 1617 12/18/19 1630 12/18/19 1645  BP:  101/74 (!) 161/62 (!) 157/97  Pulse: 83 79 77 84  Resp: 16 14 14 17   Temp: 98 F (36.7 C)     TempSrc: Oral     SpO2: 100% 96% 98% 99%  Weight: (!) 143.3 kg     Height: 6' (1.829 m)       Constitutional: No fever, no chest pain,  no nausea or vomiting.  Having difficulty speaking in full sentences and complaining of shortness of breath and orthopnea. Vitals:   12/18/19 1554 12/18/19 1617 12/18/19 1630 12/18/19 1645  BP:  101/74 (!) 161/62 (!) 157/97  Pulse: 83 79 77 84  Resp: 16 14 14 17   Temp: 98 F (36.7 C)     TempSrc: Oral     SpO2: 100% 96% 98% 99%  Weight: (!) 143.3 kg     Height: 6' (1.829 m)      Eyes: PERRL, lids and conjunctivae normal; no icterus. ENMT: Mucous membranes are moist. Posterior  pharynx clear of any exudate or lesions. Neck: normal, supple, no masses, no thyromegaly. Respiratory: Positive expiratory wheezing; bilateral rhonchi right, decreased breath sounds at the bases with positive fine crackles.  No using accessory muscle.  9 L high flow nasal cannula supplementation in place. Cardiovascular: Regular rate and rhythm; no rubs, no gallops, unable to assess JVD with body habitus. Abdomen: Obese, no tenderness, no masses palpated. No hepatosplenomegaly. Bowel sounds positive.  Musculoskeletal: no clubbing / cyanosis. No joint deformity upper and lower extremities. Good ROM, no contractures. Skin: no petechiae; chronic abscesses dermatitis appreciated on his legs bilaterally. Neurologic: CN 2-12 grossly intact. Sensation intact, muscle strength 4 out of 5 bilaterally in the setting of deconditioning and poor effort.  No focal deficit appreciated. Psychiatric: Alert and oriented x 3. Normal mood.   Labs on Admission: I have personally reviewed following labs and imaging studies  CBC: Recent Labs  Lab 12/18/19 1200  WBC 8.6  NEUTROABS 7.2  HGB 8.1*  HCT 27.1*  MCV 91.9  PLT 962    Basic Metabolic Panel: Recent Labs  Lab 12/18/19 1200 12/18/19 1353  NA 138  --   K 4.2  --   CL 100  --   CO2 27  --   GLUCOSE 243*  --   BUN 51*  --   CREATININE 3.96*  --   CALCIUM 8.7*  --   MG  --  2.1    GFR: Estimated Creatinine Clearance: 25.9 mL/min (A) (by C-G formula based on SCr  of 3.96 mg/dL (H)).  Liver Function Tests: Recent Labs  Lab 12/18/19 1200  AST 8*  ALT 7  ALKPHOS 69  BILITOT 0.7  PROT 7.4  ALBUMIN 3.5    Urine analysis:    Component Value Date/Time   COLORURINE YELLOW 12/10/2016 1041   APPEARANCEUR HAZY (A) 12/10/2016 1041   LABSPEC 1.016 12/10/2016 1041   PHURINE 5.0 12/10/2016 1041   GLUCOSEU NEGATIVE 12/10/2016 1041   HGBUR LARGE (A) 12/10/2016 1041   BILIRUBINUR NEGATIVE 12/10/2016 1041   KETONESUR 5 (A) 12/10/2016 1041   PROTEINUR 100 (A) 12/10/2016 1041   UROBILINOGEN 0.2 03/04/2015 1835   NITRITE NEGATIVE 12/10/2016 1041   LEUKOCYTESUR TRACE (A) 12/10/2016 1041    Radiological Exams on Admission: DG Chest Port 1 View  Result Date: 12/18/2019 CLINICAL DATA:  Shortness of breath EXAM: PORTABLE CHEST 1 VIEW COMPARISON:  11/17/2019 FINDINGS: Mild subpleural patchy opacities in the lungs bilaterally, right upper and lower lobe predominant, favoring mild multifocal pneumonia. No pleural effusion or pneumothorax. The heart is top-normal in size.  Thoracic aortic atherosclerosis. IMPRESSION: Mild multifocal pneumonia. Electronically Signed   By: Julian Hy M.D.   On: 12/18/2019 12:40    EKG: Independently reviewed.  No acute ischemic changes; sinus rhythm.  Assessment/Plan 1-Acute on chronic respiratory failure with hypoxia ([name]) -Multifactorial: In the setting of acute on chronic diastolic heart failure, COPD exacerbation and concerns for HCAP/bronchiectasis. -Patient will be admitted to the hospital -Wean down oxygen supplementation as tolerated to his baseline (2-3 L nasal cannula) -Closely observe overnight in the stepdown unit as he was requiring the use of CPAP on presentation and using HFNC 9L currently.. -Started on IV steroids, bronchodilators, antibiotics, flutter valve, incentive spirometer and mucolytic's. -Continue beta-blocker, daily weights, low-sodium diet, IV Lasix and repeat 2D echo. -Troponin negative. -As  mentioned above started on antibiotics for underlying concerns of bronchiectasis and possible pneumonia. -No fever and normal WBCs.  2-type 2 diabetes with chronic renal failure -  Continue sliding scale insulin and [place]-acting. -Modified carbohydrate diet has been ordered -Will follow CBGs and adjust hypoglycemic regimen as needed.  3-hypertension -Overall stable -Continue current antihypertensive regimen -Will follow vital signs.  4-type 2 diabetes with polyneuropathy -Continue Neurontin -Continue as needed analgesics.  5-gastroesophageal reflux disease/GI prophylaxis -Continue PPI.  6-morbid obesity -Body mass index is 42.85 kg/m. -Low calorie diet, portion control and lifestyle changes discussed with patient.  7-depression/anxiety -Continue the use of Prozac and as needed Xanax -Stable mood and currently no complaints of acute hallucinations or suicidal ideation.  8-HLD -continue statins.  9-hx of gout -no acute flare currently -continue allopurinol   DVT prophylaxis: Heparin Code Status:   Full code Family Communication: No family at bedside. Disposition Plan:   Patient is from:  SNF [facility])  Anticipated DC to:  SNF  Anticipated DC date:  To be determined.   Anticipated DC barriers: Stabilization of his respiratory status.  Consults called:  None Admission status:  Inpatient, length of stay more than 2 midnights; stepdown.  Severity of Illness: Inpatient.  Severe illness on presentation; multifactorial acute on chronic respiratory failure with hypoxia in the setting of COPD exacerbation, acute on chronic diastolic heart failure and concerns for healthcare associated pneumonia/bronchiectasis.  Patient will need hospitalization to further address treatment of these conditions with the use of IV antibiotics, IV diuresis, steroids and nebulizer management.    Barton Dubois MD [facility]  How to contact the [facility] Attending or Consulting provider [place] or  covering provider during after hours [place], for this patient?   1. Check the care team in [facility] and look for a) attending/consulting [facility] provider listed and b) the [facility] team listed 2. Log into [email or web address] and use [place]'s universal password to access. If you do not have the password, please contact the hospital operator. 3. Locate the [facility] provider you are looking for under [facility] and page to a number that you can be directly reached. 4. If you still have difficulty reaching the provider, please page the [facility] (Director on Call) for the Hospitalists listed on amion for assistance.  12/18/2019, 5:07 PM

## 2019-12-18 NOTE — ED Notes (Signed)
ED Provider at bedside. 

## 2019-12-19 ENCOUNTER — Inpatient Hospital Stay (HOSPITAL_COMMUNITY): Payer: Medicare (Managed Care)

## 2019-12-19 DIAGNOSIS — N1832 Chronic kidney disease, stage 3b: Secondary | ICD-10-CM

## 2019-12-19 DIAGNOSIS — I5033 Acute on chronic diastolic (congestive) heart failure: Secondary | ICD-10-CM

## 2019-12-19 LAB — BASIC METABOLIC PANEL
Anion gap: 10 (ref 5–15)
BUN: 54 mg/dL — ABNORMAL HIGH (ref 8–23)
CO2: 27 mmol/L (ref 22–32)
Calcium: 8.8 mg/dL — ABNORMAL LOW (ref 8.9–10.3)
Chloride: 102 mmol/L (ref 98–111)
Creatinine, Ser: 4.03 mg/dL — ABNORMAL HIGH (ref 0.61–1.24)
GFR calc Af Amer: 16 mL/min — ABNORMAL LOW (ref >60)
GFR calc non Af Amer: 14 mL/min — ABNORMAL LOW (ref >60)
Glucose, Bld: 253 mg/dL — ABNORMAL HIGH (ref 70–99)
Potassium: 4.9 mmol/L (ref 3.5–5.1)
Sodium: 139 mmol/L (ref 135–145)

## 2019-12-19 LAB — ECHOCARDIOGRAM COMPLETE
AR max vel: 3.25 cm2
AV Area VTI: 3.88 cm2
AV Area mean vel: 3.89 cm2
AV Mean grad: 2.8 mmHg
AV Peak grad: 6.4 mmHg
Ao pk vel: 1.26 m/s
Area-P 1/2: 3.11 cm2
Height: 72 in
S' Lateral: 4.03 cm
Weight: 5044.12 oz

## 2019-12-19 MED ORDER — INSULIN ASPART 100 UNIT/ML ~~LOC~~ SOLN
5.0000 [IU] | Freq: Three times a day (TID) | SUBCUTANEOUS | Status: DC
  Administered 2019-12-19 – 2019-12-21 (×7): 5 [IU] via SUBCUTANEOUS

## 2019-12-19 MED ORDER — CHLORHEXIDINE GLUCONATE CLOTH 2 % EX PADS
6.0000 | MEDICATED_PAD | Freq: Every day | CUTANEOUS | Status: DC
  Administered 2019-12-19 – 2019-12-21 (×3): 6 via TOPICAL

## 2019-12-19 MED ORDER — HYDROCODONE-ACETAMINOPHEN 5-325 MG PO TABS
1.0000 | ORAL_TABLET | Freq: Four times a day (QID) | ORAL | Status: DC | PRN
  Administered 2019-12-19 – 2019-12-20 (×3): 1 via ORAL
  Filled 2019-12-19 (×3): qty 1

## 2019-12-19 MED ORDER — INSULIN DETEMIR 100 UNIT/ML ~~LOC~~ SOLN
30.0000 [IU] | Freq: Every day | SUBCUTANEOUS | Status: DC
  Administered 2019-12-19 – 2019-12-20 (×2): 30 [IU] via SUBCUTANEOUS
  Filled 2019-12-19 (×3): qty 0.3

## 2019-12-19 NOTE — Progress Notes (Signed)
  Echocardiogram 2D Echocardiogram has been performed.  Jannett Celestine 12/19/2019, 1:52 PM

## 2019-12-19 NOTE — Progress Notes (Addendum)
PROGRESS NOTE    Eric Scott  TDD:[id number] DOB: 12-Mar-1951 DOA: 12/18/2019 PCP: [name], MD    Chief Complaint  Patient presents with  . Shortness of Breath    Brief Narrative:  Eric Scott is a 69 y.o. male with medical history significant of anemia of chronic kidney disease, stage IV-V renal failure, type 2 diabetes with nephropathy/neuropathy, morbid obesity, gastroesophageal reflux disease, hypertension, hyperlipidemia, chronic diastolic heart failure and history of peripheral vascular disease; who presented to the hospital secondary to worsening in his breathing and hypoxia.  Per patient reports symptoms started approximately 48 hours prior to admission and continue progressing.  They had not been any fever, chills, diaphoresis, chest pain, nausea, vomiting, abdominal pain, dysuria, hematuria, melena, hematochezia or new focal deficits.  He chronically uses 2-3 L nasal supplementation at baseline 24/7.  He expressed an increase wheezing and intermittent nonproductive coughing spells, bronchodilators at home failed to improve his symptoms.  On the day of admission while wearing his usual oxygen supplementation he experienced significant worsening in his breathing and EMS was contacted; on their arrival oxygen saturation was in the mid 70s.    ED Course: Patient work-up demonstrating elevated BNP, fluid overload on examination and positive wheezing/crackles on exam.  Chest x-ray with vascular congestion and multifocal infiltrates suggesting CHF versus multifocal pneumonia.  Cultures taken, IV antibiotics started, nebulizer management and steroids provided.  Patient is negative for Covid.  Patient was able to be weaned off CPAP, but requiring 9 L high flow nasal cannula supplementation.  [facility] has been contacted today patient for further evaluation and management of acute on chronic respiratory failure with hypoxia.   Assessment & Plan: 1-acute on chronic respiratory failure with  hypoxia -Multifactorial in the setting of COPD exacerbation, bronchiectasis/pneumonia and acute on chronic diastolic heart failure. -Patient has demonstrated improvement in his breathing, but is still complaining of orthopnea and shortness of breath with minimal exertion. -Fine crackles appreciated on examination along with respiratory wheezing or rhonchi. -Requiring 3-4 L nasal cannula supplementation (he normally uses around 2 L at baseline). -Continue IV diuresis, low-sodium diet, daily weights, strict I's and O's and close monitoring of electrolytes and renal function. -We will also continue weaning down his oxygen supplementation as tolerated towards baseline; continue the use of his steroids, antibiotics, nebulizer management and mucolytic's. -Patient will also use flutter valve and incentive respirometer. -Transfer to telemetry bed; hopefully back to SNF in the next 48 hours.  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 -Continue to follow blood sugar levels. -Continue minimizing as much as possible the use of nephrotoxic agents; close monitoring outpatient renal function trend especially with active diuresis. -renal function and GFR appears to be at baseline.  3-essential hypertension -Is stable and well-controlled -Continue current antihypertensive regimen and follow vital signs.  4-gastroesophageal flux disease -Continue PPI  5-diabetic polyneuropathy -Continue as needed analgesics and Neurontin.  6-morbid obesity -Body mass index is 42.76 kg/m. -Low calorie diet, portion control and increase physical activity discussed with patient.  7-hyperlipidemia -Continue statins  8-history of depression/anxiety -Mood appears to be stable -No suicidal ideation or  hallucination -Continue the use of Prozac and as needed Xanax.  9-history of gout -No acute flare appreciated -Continue the use of allopurinol.  10-hyperlipidemia -Continue statins.  DVT prophylaxis: Heparin.  Code Status: Full code. Family Communication: No family at bedside. Disposition:   Status is: Inpatient  Dispo: The patient is from: SNF              Anticipated d/c is to: Skilled nursing facility              Anticipated d/c date is: 12/21/19              Patient currently no medically stable for discharge; still complaining of shortness of breath and orthopnea; requiring slightly higher oxygen supplementation that we normally uses at home.  Will continue IV diuresis and treatment for his COPD exacerbation.  No longer requiring the use of CPAP, patient will be transferred to telemetry bed.       Consultants:  None   Procedures:  See below for x-ray reports.   Antimicrobials:  Rocephin and Zithromax (12/18/2019).   Subjective: Afebrile, no chest pain, no nausea, no vomiting; complaining of shortness of breath with minimal exertion and requiring higher level of oxygen supplementation, he normally uses at home.  Fluid overload appreciated on examination and fine crackles heard.  Objective: Vitals:   12/19/19 0600 12/19/19 0731 12/19/19 0800 12/19/19 0900  BP: (!) 174/72  (!) 142/75 136/60  Pulse: 72 73 75 83  Resp: 14 13 19 12   Temp:  98.2 F (36.8 C)    TempSrc:  Oral    SpO2: 99% 97% 95% 94%  Weight:      Height:        Intake/Output Summary (Last 24 hours) at 12/19/2019 1029 Last data filed at 12/19/2019 1012 Gross per 24 hour  Intake 592.98 ml  Output 5130 ml  Net -4537.02 ml   Filed Weights   12/18/19 1134 12/18/19 1554 12/19/19 0500  Weight: 136.1 kg (!) 143.3 kg (!) 143 kg    Examination:  General exam: Chronically ill in appearance; obese; reports no nausea, no vomiting, no fever.  Still complaining of shortness of breath with minimal  exertion and having orthopnea. Respiratory system: Bilateral rhonchi right, positive expiratory wheezing and fine crackles at the bases.  Currently no using accessory muscles.  Using 3-4 L nasal cannula supplementation. Cardiovascular system: Rate controlled, unable to assess JVD with body habitus; no murmurs, no rubs, no gallops. Gastrointestinal system: Abdomen is obese, soft and nontender. No organomegaly or masses felt. Normal bowel sounds heard. Central nervous system: Alert and oriented. No focal neurological deficits. Extremities: 2 + edema bilaterally; no cyanosis, no clubbing.  Lower extremity stasis dermatitis appreciated (Left more than right). Skin: No cyanosis or clubbing. Psychiatry: Judgement and insight appear normal. Mood & affect appropriate.    Data Reviewed: I have personally reviewed following labs and imaging studies  CBC: Recent Labs  Lab 12/18/19 1200  WBC 8.6  NEUTROABS 7.2  HGB 8.1*  HCT 27.1*  MCV 91.9  PLT 426    Basic Metabolic Panel: Recent Labs  Lab 12/18/19 1200 12/18/19 1353 12/19/19 0459  NA 138  --  139  K 4.2  --  4.9  CL 100  --  102  CO2 27  --  27  GLUCOSE 243*  --  253*  BUN 51*  --  54*  CREATININE 3.96*  --  4.03*  CALCIUM 8.7*  --  8.8*  MG  --  2.1  --     GFR: Estimated Creatinine Clearance: 25.4 mL/min (A) (by C-G formula based on SCr of 4.03 mg/dL (H)).  Liver Function Tests: Recent Labs  Lab 12/18/19 1200  AST 8*  ALT 7  ALKPHOS 69  BILITOT 0.7  PROT 7.4  ALBUMIN 3.5    CBG: Recent Labs  Lab 12/18/19 1611  GLUCAP 260*     Recent Results (from the past 240 hour(s))  SARS Coronavirus 2 by RT PCR (hospital order, performed in [facility] hospital lab) Nasopharyngeal Nasopharyngeal Swab     Status: None   Collection Time: 12/18/19 11:54 AM   Specimen: Nasopharyngeal Swab  Result Value Ref Range Status   SARS Coronavirus 2 NEGATIVE NEGATIVE Final    Comment: (NOTE) SARS-CoV-2 target nucleic acids are NOT  DETECTED.  The SARS-CoV-2 RNA is generally detectable in upper and lower respiratory specimens during the acute phase of infection. The lowest concentration of SARS-CoV-2 viral copies this assay can detect is 250 copies / mL. A negative result does not preclude SARS-CoV-2 infection and should not be used as the sole basis for treatment or other patient management decisions.  A negative result may occur with improper specimen collection / handling, submission of specimen other than nasopharyngeal swab, presence of viral mutation(s) within the areas targeted by this assay, and inadequate number of viral copies (<250 copies / mL). A negative result must be combined with clinical observations, patient history, and epidemiological information.  Fact Sheet for Patients:   StrictlyIdeas.no  Fact Sheet for Healthcare Providers: BankingDealers.co.za  This test is not yet approved or  cleared by the [place] FDA and has been authorized for detection and/or diagnosis of SARS-CoV-2 by FDA under an Emergency Use Authorization (EUA).  This EUA will remain in effect (meaning this test can be used) for the duration of the COVID-19 declaration under Section 564(b)(1) of the Act, 21 U.S.C. section 360bbb-3(b)(1), unless the authorization is terminated or revoked sooner.  Performed at [facility], [address], [place], [place] [address]   MRSA PCR Screening     Status: None   Collection Time: 12/18/19  4:00 PM   Specimen: Nasal Mucosa; Nasopharyngeal  Result Value Ref Range Status   MRSA by PCR NEGATIVE NEGATIVE Final    Comment:        The GeneXpert MRSA Assay (FDA approved for NASAL specimens only), is one component of a comprehensive MRSA colonization surveillance program. It is not intended to diagnose MRSA infection nor to guide or monitor treatment for MRSA infections. Performed at [facility], [address], [place], [place]  [address]      Radiology Studies: DG Chest [facility] 1 View  Result Date: 12/18/2019 CLINICAL DATA:  Shortness of breath EXAM: PORTABLE CHEST 1 VIEW COMPARISON:  11/17/2019 FINDINGS: Mild subpleural patchy opacities in the lungs bilaterally, right upper and lower lobe predominant, favoring mild multifocal pneumonia. No pleural effusion or pneumothorax. The heart is top-normal in size.  Thoracic aortic atherosclerosis. IMPRESSION: Mild multifocal pneumonia. Electronically Signed   By: Julian Hy M.D.   On: 12/18/2019 12:40    Scheduled Meds: . allopurinol  100 mg Oral Daily  . amLODipine  10 mg Oral Daily  . aspirin EC  81 mg Oral Q breakfast  . budesonide (PULMICORT) nebulizer solution  0.5 mg Nebulization BID  . calcitRIOL  0.25 mcg Oral Daily  . calcium carbonate  2 tablet Oral BID  . Chlorhexidine Gluconate Cloth  6 each Topical Daily  . dextromethorphan-guaiFENesin  1 tablet Oral BID  . FLUoxetine  20 mg Oral Daily  . furosemide  40 mg Intravenous Q12H  . gabapentin  300 mg Oral QHS  .  heparin  5,000 Units Subcutaneous Q8H  . insulin aspart  0-20 Units Subcutaneous TID WC  . insulin aspart  0-5 Units Subcutaneous QHS  . insulin aspart  5 Units Subcutaneous TID WC  . insulin detemir  30 Units Subcutaneous QHS  . ipratropium-albuterol  3 mL Nebulization Q6H  . lidocaine  1 patch Transdermal Daily  . magnesium oxide  400 mg Oral Daily  . methylPREDNISolone (SOLU-MEDROL) injection  40 mg Intravenous Q8H  . metoprolol succinate  50 mg Oral Q breakfast  . multivitamin with minerals  1 tablet Oral Daily  . omega-3 acid ethyl esters  1 g Oral BID  . pantoprazole  40 mg Oral QAC breakfast  . polyethylene glycol  17 g Oral Daily  . sodium chloride flush  3 mL Intravenous Q12H  . tamsulosin  0.4 mg Oral QPC supper   Continuous Infusions: . sodium chloride Stopped (12/18/19 1633)  . azithromycin Stopped (12/18/19 1736)  . cefTRIAXone (ROCEPHIN)  IV Stopped (12/18/19 1558)     LOS:  1 day    Time spent: 35 minutes.   Barton Dubois, MD [facility]   To contact the attending provider between 7A-7P or the covering provider during after hours 7P-7A, please log into the web site [email or web address] and access using universal [place] password for that web site. If you do not have the password, please call the hospital operator.  12/19/2019, 10:29 AM

## 2019-12-20 LAB — BASIC METABOLIC PANEL
Anion gap: 11 (ref 5–15)
BUN: 62 mg/dL — ABNORMAL HIGH (ref 8–23)
CO2: 27 mmol/L (ref 22–32)
Calcium: 8.9 mg/dL (ref 8.9–10.3)
Chloride: 99 mmol/L (ref 98–111)
Creatinine, Ser: 4.3 mg/dL — ABNORMAL HIGH (ref 0.61–1.24)
GFR calc Af Amer: 15 mL/min — ABNORMAL LOW (ref >60)
GFR calc non Af Amer: 13 mL/min — ABNORMAL LOW (ref >60)
Glucose, Bld: 282 mg/dL — ABNORMAL HIGH (ref 70–99)
Potassium: 5 mmol/L (ref 3.5–5.1)
Sodium: 137 mmol/L (ref 135–145)

## 2019-12-20 LAB — LEGIONELLA PNEUMOPHILA SEROGP 1 UR AG: L. pneumophila Serogp 1 Ur Ag: NEGATIVE

## 2019-12-20 LAB — MAGNESIUM: Magnesium: 2.3 mg/dL (ref 1.7–2.4)

## 2019-12-20 LAB — GLUCOSE, CAPILLARY
Glucose-Capillary: 149 mg/dL — ABNORMAL HIGH (ref 70–99)
Glucose-Capillary: 267 mg/dL — ABNORMAL HIGH (ref 70–99)

## 2019-12-20 MED ORDER — DOXYCYCLINE HYCLATE 100 MG PO TABS
100.0000 mg | ORAL_TABLET | Freq: Two times a day (BID) | ORAL | Status: DC
  Administered 2019-12-20 – 2019-12-21 (×3): 100 mg via ORAL
  Filled 2019-12-20 (×3): qty 1

## 2019-12-20 MED ORDER — METHYLPREDNISOLONE SODIUM SUCC 40 MG IJ SOLR
40.0000 mg | Freq: Two times a day (BID) | INTRAMUSCULAR | Status: DC
  Administered 2019-12-21 (×2): 40 mg via INTRAVENOUS
  Filled 2019-12-20 (×2): qty 1

## 2019-12-20 NOTE — Progress Notes (Signed)
PROGRESS NOTE    Eric Scott  UMP:[id number] DOB: [birth year] DOA: 12/18/2019 PCP: [name], MD    Chief Complaint  Patient presents with  . Shortness of Breath    Brief Narrative:  Eric Scott is a 69 y.o. male with medical history significant of anemia of chronic kidney disease, stage IV-V renal failure, type 2 diabetes with nephropathy/neuropathy, morbid obesity, gastroesophageal reflux disease, hypertension, hyperlipidemia, chronic diastolic heart failure and history of peripheral vascular disease; who presented to the hospital secondary to worsening in his breathing and hypoxia.  Per patient reports symptoms started approximately 48 hours prior to admission and continue progressing.  They had not been any fever, chills, diaphoresis, chest pain, nausea, vomiting, abdominal pain, dysuria, hematuria, melena, hematochezia or new focal deficits.  He chronically uses 2-3 L nasal supplementation at baseline 24/7.  He expressed an increase wheezing and intermittent nonproductive coughing spells, bronchodilators at home failed to improve his symptoms.  On the day of admission while wearing his usual oxygen supplementation he experienced significant worsening in his breathing and EMS was contacted; on their arrival oxygen saturation was in the mid 70s.    ED Course: Patient work-up demonstrating elevated BNP, fluid overload on examination and positive wheezing/crackles on exam.  Chest x-ray with vascular congestion and multifocal infiltrates suggesting CHF versus multifocal pneumonia.  Cultures taken, IV antibiotics started, nebulizer management and steroids provided.  Patient is negative for Covid.  Patient was able to be weaned off CPAP, but requiring 9 L high flow nasal cannula supplementation.  [facility] has been contacted today patient for further evaluation and management of acute on chronic respiratory failure with hypoxia.   Assessment & Plan: 1-acute on chronic respiratory failure with  hypoxia -Multifactorial in the setting of COPD exacerbation, bronchiectasis/pneumonia and acute on chronic diastolic heart failure. -Patient has demonstrated improvement in his breathing, but is still complaining of orthopnea and shortness of breath. -improved air movement, decreased Bs at that bases and mild exp wheezing.  -Requiring 2.5 L nasal cannula supplementation (he normally uses around 2 L at baseline). -Continue IV diuresis, low-sodium diet, daily weights, strict I's and O's and close monitoring of electrolytes and renal function. -Will continue weaning down his oxygen supplementation as tolerated towards baseline; continue the use of his steroids (starting tapering), antibiotics (transiotining to oral doxycycline), nebulizer management and mucolytic's. -Patient will also continue using flutter valve and incentive respirometer. -hopefully back to SNF in the next 24 hours or so.  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 -Continue to follow blood sugar levels. -Continue minimizing as much as possible the use of nephrotoxic agents; close monitoring outpatient renal function trend especially with active diuresis. -renal function and GFR appears to be at baseline.  3-essential hypertension -Is stable and well-controlled -Continue current antihypertensive regimen and follow vital signs.  4-gastroesophageal flux disease -Continue PPI  5-diabetic polyneuropathy -Continue as needed analgesics and Neurontin.  6-morbid obesity -Body mass index is 42.82 kg/m. -Low calorie diet, portion control and increase physical activity discussed with patient.  7-hyperlipidemia -Continue statins  8-history of depression/anxiety -Mood appears to be stable -No suicidal ideation or  hallucination -Continue the use of Prozac and as needed Xanax.  9-history of gout -No acute flare appreciated -Continue the use of allopurinol.  10-hyperlipidemia -Continue statins.  DVT  prophylaxis: Heparin. Code Status: Full code. Family Communication: No family at bedside. Disposition:   Status is: Inpatient  Dispo: The patient is from: SNF              Anticipated d/c is to: Skilled nursing facility              Anticipated d/c date is: 12/21/19              Patient currently no medically stable for discharge; still complaining of shortness of breath and orthopnea and with signs of fluid overload syndrome. Will continue IV diuresis and treatment for his COPD exacerbation.  No longer requiring the use of CPAP, will transitioned antibiotics to oral regimen.      Consultants:  None   Procedures:  See below for x-ray reports.   Antimicrobials:  Rocephin and Zithromax (12/18/2019>>>12/20/19) Doxycycline 12/20/19   Subjective: No fever, no chest pain, no nausea, no vomiting.  Still complaining of some orthopnea.  Good urine output reported.  Using 2.5 L supplementation.  Objective: Vitals:   12/20/19 1057 12/20/19 1100 12/20/19 1300 12/20/19 1331  BP:  (!) 140/46 (!) 151/52   Pulse: 73 71 72   Resp: 13 19 14    Temp: 98 F (36.7 C)     TempSrc: Oral     SpO2: 97% 95% 96% 97%  Weight:      Height:        Intake/Output Summary (Last 24 hours) at 12/20/2019 1336 Last data filed at 12/20/2019 1200 Gross per 24 hour  Intake 840 ml  Output 3075 ml  Net -2235 ml   Filed Weights   12/18/19 1554 12/19/19 0500 12/20/19 0516  Weight: (!) 143.3 kg (!) 143 kg (!) 143.2 kg    Examination: General exam: Alert, awake, oriented x 3; reports no chest pain, no nausea, no vomiting, no fever.  Patient expressed her breathing has improved; but is still having some orthopnea.  Using 2.5 L nasal cannula supplementation. Respiratory system: Currently no wheezing, no using accessory  muscles, decreased breath sounds at the bases.  Positive scattered rhonchi appreciated. Cardiovascular system:Rate controlled, unable to assess JVD with body habitus; no murmurs, no rubs, no gallops. Gastrointestinal system: Abdomen is obese, nondistended, soft and nontender. No organomegaly or masses felt. Normal bowel sounds heard.  Increased abdominal girth appreciated on exam. Central nervous system: Alert and oriented. No focal neurological deficits. Extremities/skin: No cyanosis or clubbing; 1-2+ edema bilaterally, lower extremity stasis dermatitis appreciated (left more than right).  No signs of superimposed infection. Psychiatry: Judgement and insight appear normal. Mood & affect appropriate.    Data Reviewed: I have personally reviewed following labs and imaging studies  CBC: Recent Labs  Lab 12/18/19 1200  WBC 8.6  NEUTROABS 7.2  HGB 8.1*  HCT 27.1*  MCV 91.9  PLT 409    Basic Metabolic Panel: Recent Labs  Lab 12/18/19 1200 12/18/19 1353 12/19/19 0459 12/20/19 0459  NA 138  --  139 137  K 4.2  --  4.9 5.0  CL 100  --  102 99  CO2 27  --  27 27  GLUCOSE 243*  --  253* 282*  BUN 51*  --  54* 62*  CREATININE 3.96*  --  4.03* 4.30*  CALCIUM 8.7*  --  8.8* 8.9  MG  --  2.1  --   --     GFR: Estimated Creatinine Clearance: 23.8 mL/min (A) (by C-G formula based on SCr of 4.3 mg/dL (H)).  Liver Function Tests: Recent Labs  Lab 12/18/19 1200  AST 8*  ALT 7  ALKPHOS 69  BILITOT 0.7  PROT 7.4  ALBUMIN 3.5    CBG: Recent Labs  Lab 12/18/19 1611  GLUCAP 260*     Recent Results (from the past 240 hour(s))  SARS Coronavirus 2 by RT PCR (hospital order, performed in [facility] hospital lab) Nasopharyngeal Nasopharyngeal Swab     Status: None   Collection Time: 12/18/19 11:54 AM   Specimen: Nasopharyngeal Swab  Result Value Ref Range Status   SARS Coronavirus 2 NEGATIVE NEGATIVE Final    Comment: (NOTE) SARS-CoV-2 target nucleic acids are NOT  DETECTED.  The SARS-CoV-2 RNA is generally detectable in upper and lower respiratory specimens during the acute phase of infection. The lowest concentration of SARS-CoV-2 viral copies this assay can detect is 250 copies / mL. A negative result does not preclude SARS-CoV-2 infection and should not be used as the sole basis for treatment or other patient management decisions.  A negative result may occur with improper specimen collection / handling, submission of specimen other than nasopharyngeal swab, presence of viral mutation(s) within the areas targeted by this assay, and inadequate number of viral copies (<250 copies / mL). A negative result must be combined with clinical observations, patient history, and epidemiological information.  Fact Sheet for Patients:   StrictlyIdeas.no  Fact Sheet for Healthcare Providers: BankingDealers.co.za  This test is not yet approved or  cleared by the [place] FDA and has been authorized for detection and/or diagnosis of SARS-CoV-2 by FDA under an Emergency Use Authorization (EUA).  This EUA will remain in effect (meaning this test can be used) for the duration of the COVID-19 declaration under Section 564(b)(1) of the Act, 21 U.S.C. section 360bbb-3(b)(1), unless the authorization is terminated or revoked sooner.  Performed at [facility], [address], [place], [place] [address]   MRSA PCR Screening     Status: None   Collection Time: 12/18/19  4:00 PM   Specimen: Nasal Mucosa; Nasopharyngeal  Result Value Ref Range Status   MRSA by PCR NEGATIVE NEGATIVE Final    Comment:        The GeneXpert MRSA Assay (FDA approved for NASAL specimens only), is one component of a comprehensive MRSA colonization surveillance program. It is not intended to diagnose MRSA infection nor to guide or monitor treatment for MRSA infections. Performed at [facility], [address] [place], [place]  [address]      Radiology Studies: ECHOCARDIOGRAM COMPLETE  Result Date: 12/19/2019    ECHOCARDIOGRAM REPORT   Patient Name:   LAVAN IMES Krahn Date of Exam: 12/19/2019 Medical Rec #:  [id number]      Height:       72.0 in Accession #:    [id number]     Weight:       315.3 lb Date of Birth:  1951/02/17      BSA:          2.585 m Patient Age:    69 years       BP:           136/60 mmHg Patient Gender: M              HR:           83 bpm. Exam Location:  [facility] Procedure: 2D Echo Indications:    CHF 428  History:        Patient has prior history of Echocardiogram examinations,  most                 recent 05/08/2019. CHF, COPD and Stroke; Risk                 Factors:Dyslipidemia, Hypertension and Current Smoker.  Sonographer:    Jannett Celestine RDCS (AE) Referring Phys: [place]  1. Left ventricular ejection fraction, by estimation, is 55 to 60%. The left ventricle has normal function. Left ventricular endocardial border not optimally defined to evaluate regional wall motion. There is severe left ventricular hypertrophy. Left ventricular diastolic parameters are indeterminate.  2. Right ventricular systolic function is normal. The right ventricular size is normal.  3. The mitral valve was not well visualized. No evidence of mitral valve regurgitation. No evidence of mitral stenosis.  4. The aortic valve was not well visualized. Aortic valve regurgitation is not visualized. No aortic stenosis is present.  5. Aortic dilatation noted. There is mild dilatation of the aortic root measuring 40 mm.  6. Technically difficult study, limited visualization FINDINGS  Left Ventricle: Left ventricular ejection fraction, by estimation, is 55 to 60%. The left ventricle has normal function. Left ventricular endocardial border not optimally defined to evaluate regional wall motion. The left ventricular internal cavity size was normal in size. There is severe left ventricular hypertrophy. Left ventricular diastolic  parameters are indeterminate. Right Ventricle: The right ventricular size is normal. Right vetricular wall thickness was not assessed. Right ventricular systolic function is normal. Left Atrium: Left atrial size was not well visualized. Right Atrium: Right atrial size was not well visualized. Pericardium: There is no evidence of pericardial effusion. Mitral Valve: The mitral valve was not well visualized. No evidence of mitral valve regurgitation. No evidence of mitral valve stenosis. Tricuspid Valve: The tricuspid valve is not well visualized. Tricuspid valve regurgitation is not demonstrated. No evidence of tricuspid stenosis. Aortic Valve: The aortic valve was not well visualized. Aortic valve regurgitation is not visualized. No aortic stenosis is present. Aortic valve mean gradient measures 2.8 mmHg. Aortic valve peak gradient measures 6.4 mmHg. Aortic valve area, by VTI measures 3.88 cm. Pulmonic Valve: The pulmonic valve was not well visualized. Pulmonic valve regurgitation is not visualized. No evidence of pulmonic stenosis. Aorta: Aortic dilatation noted. There is mild dilatation of the aortic root measuring 40 mm. Pulmonary Artery: Indeterminant PASP, inadequate TR jet. IAS/Shunts: The interatrial septum was not well visualized.  LEFT VENTRICLE PLAX 2D LVIDd:         5.38 cm  Diastology LVIDs:         4.03 cm  LV e' lateral:   7.18 cm/s LV PW:         1.55 cm  LV E/e' lateral: 13.8 LV IVS:        1.60 cm  LV e' medial:    6.96 cm/s LVOT diam:     2.20 cm  LV E/e' medial:  14.3 LV SV:         88 LV SV Index:   34 LVOT Area:     3.80 cm  LEFT ATRIUM             Index       RIGHT ATRIUM           Index LA diam:        4.60 cm 1.78 cm/m  RA Area:     22.10 cm LA Vol (A2C):   92.7 ml 35.86 ml/m RA Volume:   61.40 ml  23.75 ml/m LA Vol (A4C):   66.3 ml 25.63 ml/m LA Biplane Vol: 87.4 ml 33.81 ml/m  AORTIC VALVE AV Area (Vmax):    3.25 cm AV Area (Vmean):   3.89 cm AV Area (VTI):     3.88 cm AV Vmax:            126.24 cm/s AV Vmean:          76.570 cm/s AV VTI:            0.226 m AV Peak Grad:      6.4 mmHg AV Mean Grad:      2.8 mmHg LVOT Vmax:         108.00 cm/s LVOT Vmean:        78.400 cm/s LVOT VTI:          0.231 m LVOT/AV VTI ratio: 1.02  AORTA Ao Root diam: 3.80 cm MITRAL VALVE MV Area (PHT): 3.11 cm    SHUNTS MV Decel Time: 244 msec    Systemic VTI:  0.23 m MV E velocity: 99.40 cm/s  Systemic Diam: 2.20 cm Carlyle Dolly MD Electronically signed by Carlyle Dolly MD Signature Date/Time: 12/19/2019/2:24:49 PM    Final     Scheduled Meds: . allopurinol  100 mg Oral Daily  . amLODipine  10 mg Oral Daily  . aspirin EC  81 mg Oral Q breakfast  . budesonide (PULMICORT) nebulizer solution  0.5 mg Nebulization BID  . calcitRIOL  0.25 mcg Oral Daily  . calcium carbonate  2 tablet Oral BID  . Chlorhexidine Gluconate Cloth  6 each Topical Daily  . dextromethorphan-guaiFENesin  1 tablet Oral BID  . doxycycline  100 mg Oral Q12H  . FLUoxetine  20 mg Oral Daily  . furosemide  40 mg Intravenous Q12H  . gabapentin  300 mg Oral QHS  . heparin  5,000 Units Subcutaneous Q8H  . insulin aspart  0-20 Units Subcutaneous TID WC  . insulin aspart  0-5 Units Subcutaneous QHS  . insulin aspart  5 Units Subcutaneous TID WC  . insulin detemir  30 Units Subcutaneous QHS  . ipratropium-albuterol  3 mL Nebulization Q6H  . lidocaine  1 patch Transdermal Daily  . magnesium oxide  400 mg Oral Daily  . [START ON 12/21/2019] methylPREDNISolone (SOLU-MEDROL) injection  40 mg Intravenous Q12H  . metoprolol succinate  50 mg Oral Q breakfast  . multivitamin with minerals  1 tablet Oral Daily  . omega-3 acid ethyl esters  1 g Oral BID  . pantoprazole  40 mg Oral QAC breakfast  . polyethylene glycol  17 g Oral Daily  . sodium chloride flush  3 mL Intravenous Q12H  . tamsulosin  0.4 mg Oral QPC supper   Continuous Infusions: . sodium chloride Stopped (12/18/19 1633)     LOS: 2 days    Time spent: 35  minutes.   Barton Dubois, MD [facility]   To contact the attending provider between 7A-7P or the covering provider during after hours 7P-7A, please log into the web site [email or web address] and access using universal [place] password for that web site. If you do not have the password, please call the hospital operator.  12/20/2019, 1:36 PM

## 2019-12-21 DIAGNOSIS — Z794 Long term (current) use of insulin: Secondary | ICD-10-CM

## 2019-12-21 LAB — GLUCOSE, CAPILLARY
Glucose-Capillary: 292 mg/dL — ABNORMAL HIGH (ref 70–99)
Glucose-Capillary: 225 mg/dL — ABNORMAL HIGH (ref 70–99)
Glucose-Capillary: 228 mg/dL — ABNORMAL HIGH (ref 70–99)
Glucose-Capillary: 188 mg/dL — ABNORMAL HIGH (ref 70–99)
Glucose-Capillary: 253 mg/dL — ABNORMAL HIGH (ref 70–99)
Glucose-Capillary: 264 mg/dL — ABNORMAL HIGH (ref 70–99)
Glucose-Capillary: 271 mg/dL — ABNORMAL HIGH (ref 70–99)
Glucose-Capillary: 189 mg/dL — ABNORMAL HIGH (ref 70–99)
Glucose-Capillary: 187 mg/dL — ABNORMAL HIGH (ref 70–99)

## 2019-12-21 LAB — BASIC METABOLIC PANEL
Anion gap: 12 (ref 5–15)
BUN: 72 mg/dL — ABNORMAL HIGH (ref 8–23)
CO2: 26 mmol/L (ref 22–32)
Calcium: 8.8 mg/dL — ABNORMAL LOW (ref 8.9–10.3)
Chloride: 100 mmol/L (ref 98–111)
Creatinine, Ser: 4.2 mg/dL — ABNORMAL HIGH (ref 0.61–1.24)
GFR calc Af Amer: 16 mL/min — ABNORMAL LOW (ref >60)
GFR calc non Af Amer: 13 mL/min — ABNORMAL LOW (ref >60)
Glucose, Bld: 222 mg/dL — ABNORMAL HIGH (ref 70–99)
Potassium: 4.8 mmol/L (ref 3.5–5.1)
Sodium: 138 mmol/L (ref 135–145)

## 2019-12-21 MED ORDER — DM-GUAIFENESIN ER 30-600 MG PO TB12: 1.0000 | ORAL_TABLET | Freq: Two times a day (BID) | ORAL | 0 refills | Status: DC

## 2019-12-21 MED ORDER — DOXYCYCLINE HYCLATE 100 MG PO TABS: 100.0000 mg | ORAL_TABLET | Freq: Two times a day (BID) | ORAL | 0 refills | Status: AC

## 2019-12-21 MED ORDER — TORSEMIDE 20 MG PO TABS: 40.0000 mg | ORAL_TABLET | Freq: Two times a day (BID) | ORAL | Status: DC

## 2019-12-21 MED ORDER — PREDNISONE 20 MG PO TABS: ORAL_TABLET | ORAL | 0 refills | Status: DC

## 2019-12-21 MED ORDER — GABAPENTIN 300 MG PO CAPS: 300.0000 mg | ORAL_CAPSULE | Freq: Every day | ORAL | 1 refills | Status: AC

## 2019-12-21 NOTE — Plan of Care (Signed)

## 2019-12-21 NOTE — Discharge Summary (Signed)
Physician Discharge Summary  [name] IHK:[id number] DOB: 08/23/1950 DOA: 12/18/2019  PCP: Eric Renshaw, MD  Admit date: 12/18/2019 Discharge date: 12/21/2019  Time spent: 37minutes  Recommendations for Outpatient Follow-up:  1. Repeat basic metabolic panel to follow across renal function 2. Continue to closely follow patient volume status with further adjustment to diuresis as needed 3. Outpatient follow-up with nephrology service recommended.   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   Discharge Condition: Stable and improved.  Discharge back to skilled nursing facility for further care and rehabilitation.  CODE STATUS: Full code.  Diet recommendation: Heart healthy, low calorie and modified carbohydrate diet.  Filed Weights   12/18/19 1554 12/19/19 0500 12/20/19 0516  Weight: (!) 143.3 kg (!) 143 kg (!) 143.2 kg    History of present illness:  Eric Scott a 69 y.o.malewith medical history significant ofanemia of chronic kidney disease, stage IV-V renal failure, type 2 diabetes with nephropathy/neuropathy, morbid obesity, gastroesophageal reflux disease, hypertension, hyperlipidemia, chronic diastolic heart failure and history of peripheral vascular disease; who presented to the hospital secondary to worsening in his breathing and hypoxia. Per patient reports symptoms started approximately 48 hours prior to admission and continue progressing. They had not been any fever, chills, diaphoresis, chest pain, nausea, vomiting, abdominal pain, dysuria, hematuria, melena, hematochezia or new focal deficits. He chronically uses 2-3 L  nasal supplementation at baseline 24/7. He expressed an increase wheezing and intermittent nonproductive coughing spells, bronchodilatorsat home failed to improve his symptoms. On the day of admission while wearing his usual oxygen supplementation he experienced significant worsening in his breathing and EMS was contacted; on their arrival oxygen saturation was in the mid 70s.   ED Course:Patient work-up demonstrating elevated BNP, fluid overload on examination and positive wheezing/crackles on exam. Chest x-ray with vascular congestion and multifocal infiltrates suggesting CHF versus multifocal pneumonia. Cultures taken, IV antibiotics started, nebulizer management and steroids provided. Patient is negative for Covid.Patient was able to be weaned off CPAP,but requiring 9 L high flow nasal cannula supplementation. [facility] has been contacted today patient for further evaluation and management of acute on chronic respiratory failure with hypoxia.  Hospital Course:  1-acute on chronic respiratory failure with hypoxia -Multifactorial in the setting of COPD exacerbation, bronchiectasis/pneumonia and acute on chronic diastolic heart failure. -Patient expressed breathing is back to baseline, using 2 L nasal cannula supplementation and is speaking in full sentences.  Will discharge back to skilled nursing facility for further care. -Continue to follow daily weights, low-sodium diet and adjusted dose of Demadex at time of discharge. -Oxygen supplementation has been weaned back to his baseline; continue steroids tapering, oral doxycycline (for underlying bronchiectasis), as needed bronchodilators and resumption of Breo Ellipta. -Patient will also continue using flutter valve and incentive respirometer as instructed..  2-type 2 diabetes with chronic renal failure stage 3b -Elevated CBGs appreciated in the setting of the steroids usage -Anticipate resolution of elevated blood sugars once his steroid  therapy completed. -Resume home insulin regimen -Modified carbohydrate diet has been encouraged. -Continue minimizing as much as possible the use of nephrotoxic agents; close monitoring renal function trend especially with adjusted dose of diuretics is recommended; patient will benefit of follow-up with  nephrology service as an outpatient. -GFR appears to be at baseline; slight increase in creatinine level with acute diuresis appreciated.  We might need to tolerate higher levels of creatinine in order to maintain better volume control.  3-essential hypertension -stable and well-controlled -Continue current antihypertensive regimen and follow vital signs. -Heart healthy diet has been ordered/recommended.  4-gastroesophageal flux disease -Continue PPI  5-diabetic polyneuropathy -Continue as needed analgesics and Neurontin.  6-morbid obesity -Body mass index is 42.82 kg/m. -Low calorie diet, portion control and increase physical activity discussed with patient.  7-hyperlipidemia -Continue statins -Heart healthy diet has been encouraged.  8-history of depression/anxiety -Mood appears to be stable -No suicidal ideation or hallucination -Continue the use of Prozac and as needed Xanax as per previous medication regimen..  9-history of gout -No acute flare appreciated -Continue the use of allopurinol.   Procedures: See below for x-ray reports 2-D echo: 1. Left ventricular ejection fraction, by estimation, is 55 to 60%. The  left ventricle has normal function. Left ventricular endocardial border  not optimally defined to evaluate regional wall motion. There is severe  left ventricular hypertrophy. Left  ventricular diastolic parameters are indeterminate.  2. Right ventricular systolic function is normal. The right ventricular  size is normal.  3. The mitral valve was not well visualized. No evidence of mitral valve  regurgitation. No evidence of mitral stenosis.  4.  The aortic valve was not well visualized. Aortic valve regurgitation  is not visualized. No aortic stenosis is present.  5. Aortic dilatation noted. 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  6. Technically difficult study, limited visualization   Consultations:  None  Discharge Exam: Vitals:   12/21/19 0809 12/21/19 0816  BP:    Pulse:    Resp:    Temp:    SpO2: 95% 100%    General: No chest pain, no nausea, no vomiting.  Reports of feeling the breathing is a stable and back to his baseline currently.  Using 2 L nasal cannula supplementation with good O2 sat.  Ready to go back to skilled nursing facility.  Patient is a speaking in full sentences. Cardiovascular: Rate controlled, no rubs, no gallops, unable to assess JVD with body habitus.  No murmurs on exam. Respiratory: Improved air movement bilaterally; positive scattered rhonchi.  No wheezing or frank crackles appreciated on exam. Abdomen: Obese, soft, nontender, distended, positive bowel sounds Extremities: 1+ edema bilaterally; chronic stasis dermatitis appreciated.  Discharge Instructions   Discharge Instructions    (HEART FAILURE PATIENTS) Call MD:  Anytime you have any of the following symptoms: 1) 3 pound weight gain in 24 hours or 5 pounds in 1 week 2) shortness of breath, with or without a dry hacking cough 3) swelling in the hands, feet or stomach 4) if you have to sleep on extra pillows at night in order to breathe.   Complete by: As directed    Diet - low sodium heart healthy   Complete by: As directed    Discharge instructions   Complete by: As directed    Take medications as prescribed Maintain adequate hydration Follow daily weights and low-sodium diet (less than 2.5 g of sodium daily) Follow-up with PCP in 2 weeks     Allergies as of 12/21/2019      Reactions   Blueberry Flavor Other (See Comments)   Unknown   Cucumber Extract Other (See Comments)   "Fells like I'm having a heart  attack"   Flexeril [cyclobenzaprine  Hcl] Other (See Comments)   "whole body  Tremors" (05/27/2012)   Kiwi Extract Other (See Comments)   "feels like I'm having a heart attack"      Medication List    TAKE these medications   acetaminophen 325 MG tablet Commonly known as: TYLENOL Take 2 tablets (650 mg total) by mouth every 6 (six) hours as needed for mild pain, fever or headache (or Fever >/= 101).   albuterol (2.5 MG/3ML) 0.083% nebulizer solution Commonly known as: PROVENTIL Take 2.5 mg by nebulization in the morning and at bedtime.   allopurinol 100 MG tablet Commonly known as: ZYLOPRIM Take 1 tablet by mouth daily. For gout   ALPRAZolam 0.5 MG tablet Commonly known as: XANAX Take 1 tablet (0.5 mg total) by mouth at bedtime as needed for anxiety or sleep.   amLODipine 10 MG tablet Commonly known as: NORVASC Take 1 tablet (10 mg total) by mouth daily.   aspirin EC 81 MG tablet Take 1 tablet (81 mg total) by mouth daily with breakfast.   Breo Ellipta 100-25 MCG/INH Aepb Generic drug: fluticasone furoate-vilanterol Inhale 2 puffs into the lungs daily.   calcitRIOL 0.25 MCG capsule Commonly known as: ROCALTROL Take 0.25 mcg by mouth daily.   calcium carbonate 500 MG chewable tablet Commonly known as: TUMS - dosed in mg elemental calcium Chew 2 tablets by mouth 2 (two) times daily.   cholecalciferol 1000 units tablet Commonly known as: VITAMIN D Take 5,000 Units by mouth daily.   cyanocobalamin 1000 MCG tablet Take 1,000 mcg by mouth daily.   dextromethorphan-guaiFENesin 30-600 MG 12hr tablet Commonly known as: MUCINEX DM Take 1 tablet by mouth 2 (two) times daily.   doxycycline 100 MG tablet Commonly known as: VIBRA-TABS Take 1 tablet (100 mg total) by mouth every 12 (twelve) hours for 4 days.   fish oil-omega-3 fatty acids 1000 MG capsule Take 1 g by mouth 2 (two) times daily.   FLUoxetine 20 MG capsule Commonly known as: PROZAC Take 20 mg by mouth  daily.   gabapentin 300 MG capsule Commonly known as: NEURONTIN Take 1 capsule (300 mg total) by mouth at bedtime. What changed:   medication strength  how much to take  when to take this   HumaLOG KwikPen 100 UNIT/ML KwikPen Generic drug: insulin lispro Inject 2-14 Units into the skin 2 (two) times daily. Per sliding scale 150-200= 2 units 201-250= 4 units 251-300= 6 units 301-349= 8 units 350-400=10units 401-450=12units 451-500=14units ABOVE 501- CALL MD BID- before meals and at bedtime.   HYDROcodone-acetaminophen 5-325 MG tablet Commonly known as: NORCO/VICODIN Take 1 tablet by mouth every 12 (twelve) hours as needed for moderate pain or severe pain. What changed: when to take this   insulin aspart 100 UNIT/ML injection Commonly known as: novoLOG Inject 2-14 Units into the skin 3 (three) times daily before meals. Sliding scale 150-200=2units, 201-250=4units, 251-300=6units, 301-349=8units, 350-400=10 units, 401-450=12units, 451-500=14units before meals and at betime   insulin glargine 100 UNIT/ML injection Commonly known as: LANTUS Inject 0.32 mLs (32 Units total) into the skin at bedtime.   lactulose 10 GM/15ML solution Commonly known as: CHRONULAC Take 15 mLs (10 g total) by mouth See admin instructions. --Please give 42mL [10 g total] every Monday and Friday due to tendency for high ammonia   Lidocaine 4 % Ptch Apply 1 patch topically daily. For pain What changed: Another medication with the same name was removed. Continue taking this medication, and follow the directions you see here.  magnesium oxide 400 MG tablet Commonly known as: MAG-OX Take 400 mg by mouth daily.   metoprolol succinate 50 MG 24 hr tablet Commonly known as: TOPROL-XL Take 50 mg by mouth in the morning and at bedtime. Take with or immediately following a meal.   multivitamin tablet Take 1 tablet by mouth daily. For wound healing   pantoprazole 40 MG tablet Commonly known as:  PROTONIX Take 1 tablet (40 mg total) by mouth daily before breakfast.   polyethylene glycol 17 g packet Commonly known as: MIRALAX / GLYCOLAX Take 17 g by mouth daily. For constipation   predniSONE 20 MG tablet Commonly known as: Deltasone Take 3 tablets by mouth daily x1 day; then 2 tablets by mouth daily x2 days; then 1 tablet by mouth daily x3 days; then half tablet by mouth daily x3 days and stop prednisone.   tamsulosin 0.4 MG Caps capsule Commonly known as: FLOMAX Take 1 capsule (0.4 mg total) by mouth daily after supper.   torsemide 20 MG tablet Commonly known as: Demadex Take 2 tablets (40 mg total) by mouth 2 (two) times daily. What changed:   how much to take  how to take this  when to take this  additional instructions      Allergies  Allergen Reactions   Blueberry Flavor Other (See Comments)    Unknown   Cucumber Extract Other (See Comments)    "Fells like I'm having a heart attack"   Flexeril [Cyclobenzaprine Hcl] Other (See Comments)    "whole body  Tremors" (05/27/2012)   Kiwi Extract Other (See Comments)    "feels like I'm having a heart attack"    Follow-up Information    Eric Renshaw, MD. Schedule an appointment as soon as possible for a visit in 2 week(s).   Specialty: Internal Medicine Contact information: [place] [address] [phone]               The results of significant diagnostics from this hospitalization (including imaging, microbiology, ancillary and laboratory) are listed below for reference.    Significant Diagnostic Studies: DG Chest Port 1 View  Result Date: 12/18/2019 CLINICAL DATA:  Shortness of breath EXAM: PORTABLE CHEST 1 VIEW COMPARISON:  11/17/2019 FINDINGS: Mild subpleural patchy opacities in the lungs bilaterally, right upper and lower lobe predominant, favoring mild multifocal pneumonia. No pleural effusion or pneumothorax. The heart is top-normal in size.  Thoracic aortic atherosclerosis.  IMPRESSION: Mild multifocal pneumonia. Electronically Signed   By: Julian Hy M.D.   On: 12/18/2019 12:40   ECHOCARDIOGRAM COMPLETE  Result Date: 12/19/2019    ECHOCARDIOGRAM REPORT   Patient Name:   TYRESSE JAYSON Nipp Date of Exam: 12/19/2019 Medical Rec #:  [id number]      Height:       72.0 in Accession #:    [id number]     Weight:       315.3 lb Date of Birth:  [birth year]      BSA:          2.585 m Patient Age:    43 years       BP:           136/60 mmHg Patient Gender: M              HR:           83 bpm. Exam Location:  [facility] Procedure: 2D Echo Indications:    CHF 428  History:        Patient  has prior history of Echocardiogram examinations, most                 recent 05/08/2019. CHF, COPD and Stroke; Risk                 Factors:Dyslipidemia, Hypertension and Current Smoker.  Sonographer:    Jannett Celestine RDCS (AE) Referring Phys: [place]  1. Left ventricular ejection fraction, by estimation, is 55 to 60%. The left ventricle has normal function. Left ventricular endocardial border not optimally defined to evaluate regional wall motion. There is severe left ventricular hypertrophy. Left ventricular diastolic parameters are indeterminate.  2. Right ventricular systolic function is normal. The right ventricular size is normal.  3. The mitral valve was not well visualized. No evidence of mitral valve regurgitation. No evidence of mitral stenosis.  4. The aortic valve was not well visualized. Aortic valve regurgitation is not visualized. No aortic stenosis is present.  5. Aortic dilatation noted. There is mild dilatation of the aortic root measuring 40 mm.  6. Technically difficult study, limited visualization FINDINGS  Left Ventricle: Left ventricular ejection fraction, by estimation, is 55 to 60%. The left ventricle has normal function. Left ventricular endocardial border not optimally defined to evaluate regional wall motion. The left ventricular internal cavity size was normal  in size. There is severe left ventricular hypertrophy. Left ventricular diastolic parameters are indeterminate. Right Ventricle: The right ventricular size is normal. Right vetricular wall thickness was not assessed. Right ventricular systolic function is normal. Left Atrium: Left atrial size was not well visualized. Right Atrium: Right atrial size was not well visualized. Pericardium: There is no evidence of pericardial effusion. Mitral Valve: The mitral valve was not well visualized. No evidence of mitral valve regurgitation. No evidence of mitral valve stenosis. Tricuspid Valve: The tricuspid valve is not well visualized. Tricuspid valve regurgitation is not demonstrated. No evidence of tricuspid stenosis. Aortic Valve: The aortic valve was not well visualized. Aortic valve regurgitation is not visualized. No aortic stenosis is present. Aortic valve mean gradient measures 2.8 mmHg. Aortic valve peak gradient measures 6.4 mmHg. Aortic valve area, by VTI measures 3.88 cm. Pulmonic Valve: The pulmonic valve was not well visualized. Pulmonic valve regurgitation is not visualized. No evidence of pulmonic stenosis. Aorta: Aortic dilatation noted. There is mild dilatation of the aortic root measuring 40 mm. Pulmonary Artery: Indeterminant PASP, inadequate TR jet. IAS/Shunts: The interatrial septum was not well visualized.  LEFT VENTRICLE PLAX 2D LVIDd:         5.38 cm  Diastology LVIDs:         4.03 cm  LV e' lateral:   7.18 cm/s LV PW:         1.55 cm  LV E/e' lateral: 13.8 LV IVS:        1.60 cm  LV e' medial:    6.96 cm/s LVOT diam:     2.20 cm  LV E/e' medial:  14.3 LV SV:         88 LV SV Index:   34 LVOT Area:     3.80 cm  LEFT ATRIUM             Index       RIGHT ATRIUM           Index LA diam:        4.60 cm 1.78 cm/m  RA Area:     22.10 cm LA Vol (A2C):   92.7 ml 35.86 ml/m RA  Volume:   61.40 ml  23.75 ml/m LA Vol (A4C):   66.3 ml 25.63 ml/m LA Biplane Vol: 87.4 ml 33.81 ml/m  AORTIC VALVE AV Area  (Vmax):    3.25 cm AV Area (Vmean):   3.89 cm AV Area (VTI):     3.88 cm AV Vmax:           126.24 cm/s AV Vmean:          76.570 cm/s AV VTI:            0.226 m AV Peak Grad:      6.4 mmHg AV Mean Grad:      2.8 mmHg LVOT Vmax:         108.00 cm/s LVOT Vmean:        78.400 cm/s LVOT VTI:          0.231 m LVOT/AV VTI ratio: 1.02  AORTA Ao Root diam: 3.80 cm MITRAL VALVE MV Area (PHT): 3.11 cm    SHUNTS MV Decel Time: 244 msec    Systemic VTI:  0.23 m MV E velocity: 99.40 cm/s  Systemic Diam: 2.20 cm Carlyle Dolly MD Electronically signed by Carlyle Dolly MD Signature Date/Time: 12/19/2019/2:24:49 PM    Final     Microbiology: Recent Results (from the past 240 hour(s))  SARS Coronavirus 2 by RT PCR (hospital order, performed in [place] hospital lab) Nasopharyngeal Nasopharyngeal Swab     Status: None   Collection Time: 12/18/19 11:54 AM   Specimen: Nasopharyngeal Swab  Result Value Ref Range Status   SARS Coronavirus 2 NEGATIVE NEGATIVE Final    Comment: (NOTE) SARS-CoV-2 target nucleic acids are NOT DETECTED.  The SARS-CoV-2 RNA is generally detectable in upper and lower respiratory specimens during the acute phase of infection. The lowest concentration of SARS-CoV-2 viral copies this assay can detect is 250 copies / mL. A negative result does not preclude SARS-CoV-2 infection and should not be used as the sole basis for treatment or other patient management decisions.  A negative result may occur with improper specimen collection / handling, submission of specimen other than nasopharyngeal swab, presence of viral mutation(s) within the areas targeted by this assay, and inadequate number of viral copies (<250 copies / mL). A negative result must be combined with clinical observations, patient history, and epidemiological information.  Fact Sheet for Patients:   StrictlyIdeas.no  Fact Sheet for Healthcare  Providers: BankingDealers.co.za  This test is not yet approved or  cleared by the [place] FDA and has been authorized for detection and/or diagnosis of SARS-CoV-2 by FDA under an Emergency Use Authorization (EUA).  This EUA will remain in effect (meaning this test can be used) for the duration of the COVID-19 declaration under Section 564(b)(1) of the Act, 21 U.S.C. section 360bbb-3(b)(1), unless the authorization is terminated or revoked sooner.  Performed at [facility], [address] [place], [place] [address]   MRSA PCR Screening     Status: None   Collection Time: 12/18/19  4:00 PM   Specimen: Nasal Mucosa; Nasopharyngeal  Result Value Ref Range Status   MRSA by PCR NEGATIVE NEGATIVE Final    Comment:        The GeneXpert MRSA Assay (FDA approved for NASAL specimens only), is one component of a comprehensive MRSA colonization surveillance program. It is not intended to diagnose MRSA infection nor to guide or monitor treatment for MRSA infections. Performed at [facility], [address] [place], [place] [address]  Labs: Basic Metabolic Panel: Recent Labs  Lab 12/18/19 1200 12/18/19 1353 12/19/19 0459 12/20/19 0459 12/20/19 0500 12/21/19 0633  NA 138  --  139 137  --  138  K 4.2  --  4.9 5.0  --  4.8  CL 100  --  102 99  --  100  CO2 27  --  27 27  --  26  GLUCOSE 243*  --  253* 282*  --  222*  BUN 51*  --  54* 62*  --  72*  CREATININE 3.96*  --  4.03* 4.30*  --  4.20*  CALCIUM 8.7*  --  8.8* 8.9  --  8.8*  MG  --  2.1  --   --  2.3  --    Liver Function Tests: Recent Labs  Lab 12/18/19 1200  AST 8*  ALT 7  ALKPHOS 69  BILITOT 0.7  PROT 7.4  ALBUMIN 3.5   CBC: Recent Labs  Lab 12/18/19 1200  WBC 8.6  NEUTROABS 7.2  HGB 8.1*  HCT 27.1*  MCV 91.9  PLT 208   BNP (last 3 results) Recent Labs    05/22/19 0439 11/16/19 0215 12/18/19 1200  BNP 250.0* 386.0* 295.0*    CBG: Recent Labs  Lab  12/20/19 1058 12/20/19 1621 12/20/19 2120 12/21/19 0729 12/21/19 1131  GLUCAP 271* 149* 267* 189* 187*    Signed:  Barton Dubois MD.  [facility] 12/21/2019, 1:29 PM

## 2019-12-21 NOTE — Care Management Important Message (Signed)
Important Message  Patient Details  Name: Eric Scott MRN: [id number] Date of Birth: 1951/03/23   Medicare Important Message Given:  Yes     Tommy Medal 12/21/2019, 2:42 PM

## 2019-12-21 NOTE — TOC Transition Note (Signed)
Transition of Care [facility]) - CM/SW Discharge Note   Patient Details  Name: Eric Scott MRN: [id number] Date of Birth: [birth year]  Transition of Care [facility] CM/SW Contact:  Boneta Lucks, RN Phone Number: 12/21/2019, 2:07 PM   Clinical Narrative:   Patient discharging back to [facility] today. RN to call report, Clinical sent in the hub, [facility] approved they are ready for patient to come back. TOC left message to EMS for transport. Call son- Declan to update with discharge plan.     Final next level of care: [facility] ([facility]) Barriers to Discharge: Barriers Resolved   Patient Goals and CMS Choice Patient states their goals for this hospitalization and ongoing recovery are:: Return to [facility] after discharge CMS Medicare.gov Compare Post Acute Care list provided to:: Patient Choice offered to / list presented to : Adult Children Eric Scott (son))  Discharge Placement              Patient chooses bed at: Other - please specify in the comment section below: ([facility]) Patient to be transferred to facility by: EMS Name of family member notified: Son - Eric Scott Patient and family notified of of transfer: 12/21/19  Discharge Plan and Services In-house Referral: Clinical Social Work Discharge Planning Services: NA Post Acute Care Choice: NA          DME Arranged: N/A DME Agency: NA       HH Arranged: NA [place] Agency: NA     Readmission Risk Interventions Readmission Risk Prevention Plan 05/21/2019 05/18/2019 04/22/2019  Transportation Screening Complete Complete Complete  PCP or Specialist Appt within 3-5 Days - Complete -  HRI or [place] - Complete -  Social Work Consult for [place] Planning/Counseling - Complete -  Palliative Care Screening - Not Applicable -  Medication Review Press photographer) - Complete Complete  PCP or Specialist appointment within 3-5 days of discharge - - Not Complete  PCP/Specialist Appt Not Complete comments - - SNF MD   [place] or [place] - - Not Complete  HRI or Home Care Consult Pt Refusal Comments - - from SNF  SW Recovery Care/Counseling Consult - - Complete  Palliative Care Screening - - Not [place] - - Complete  Some recent data might be hidden

## 2020-01-26 ENCOUNTER — Other Ambulatory Visit: Payer: Self-pay | Admitting: Internal Medicine

## 2020-01-26 ENCOUNTER — Other Ambulatory Visit: Payer: Self-pay

## 2020-01-26 ENCOUNTER — Ambulatory Visit (HOSPITAL_COMMUNITY)
Admission: RE | Admit: 2020-01-26 | Discharge: 2020-01-26 | Disposition: A | Discharge status: Home / Self Care | Payer: Medicare (Managed Care) | Source: Ambulatory Visit | Attending: Internal Medicine | Admitting: Internal Medicine

## 2020-01-26 ENCOUNTER — Ambulatory Visit (INDEPENDENT_AMBULATORY_CARE_PROVIDER_SITE_OTHER): Payer: Medicare (Managed Care) | Admitting: Internal Medicine

## 2020-01-26 ENCOUNTER — Encounter: Payer: Self-pay | Admitting: Internal Medicine

## 2020-01-26 DIAGNOSIS — J9611 Chronic respiratory failure with hypoxia: Secondary | ICD-10-CM | POA: Diagnosis not present

## 2020-01-26 DIAGNOSIS — J449 Chronic obstructive pulmonary disease, unspecified: Secondary | ICD-10-CM | POA: Diagnosis not present

## 2020-01-26 DIAGNOSIS — R911 Solitary pulmonary nodule: Secondary | ICD-10-CM | POA: Diagnosis not present

## 2020-01-26 NOTE — Patient Instructions (Addendum)
Breo 100 should be only one click daily  - take first thing in am  Only use your albuterol as a rescue medication to be used if you can't catch your breath by resting or doing a relaxed purse lip breathing pattern.  - The less you use it, the better it will work when you need it. - Ok to use up to   every 4 hours if you can't your breath.   Please remember to go to the  x-ray department  @  [facility] for your tests - we will call you with the results when they are available        Please schedule a follow up visit in 6 months but call sooner if needed

## 2020-01-26 NOTE — Progress Notes (Signed)
Eric Scott, male    DOB: 07-Feb-1951, 69 y.o.   MRN: [id number]   Brief patient profile:  6 yowm NH pt since around 2014  quit smoking 12/18/19 at admit:  Admit date: 12/18/2019 Discharge date: 12/21/2019 .   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   Discharge Condition: Stable and improved.  Discharge back to skilled nursing facility for further care and rehabilitation.  CODE STATUS: Full code.  Diet recommendation: Heart healthy, low calorie and modified carbohydrate diet.       Filed Weights   12/18/19 1554 12/19/19 0500 12/20/19 0516  Weight: (!) 143.3 kg (!) 143 kg (!) 143.2 kg    History of present illness:  Eric Scott a 69 y.o.malewith medical history significant ofanemia of chronic kidney disease, stage IV-V renal failure, type 2 diabetes with nephropathy/neuropathy, morbid obesity, gastroesophageal reflux disease, hypertension, hyperlipidemia, chronic diastolic heart failure and history of peripheral vascular disease; who presented to the hospital secondary to worsening in his breathing and hypoxia. Per patient reports symptoms started approximately 48 hours prior to admission and continue progressing. They had not been any fever, chills, diaphoresis, chest pain, nausea, vomiting, abdominal pain, dysuria, hematuria, melena, hematochezia or new focal deficits. He chronically uses 2-3 L nasal supplementation at baseline 24/7. He expressed an increase wheezing and intermittent nonproductive coughing spells, bronchodilatorsat home failed to improve his symptoms. On the day of admission while wearing his usual  oxygen supplementation he experienced significant worsening in his breathing and EMS was contacted; on their arrival oxygen saturation was in the mid 70s.   ED Course:Patient work-up demonstrating elevated BNP, fluid overload on examination and positive wheezing/crackles on exam. Chest x-ray with vascular congestion and multifocal infiltrates suggesting CHF versus multifocal pneumonia. Cultures taken, IV antibiotics started, nebulizer management and steroids provided. Patient is negative for Covid.Patient was able to be weaned off CPAP,but requiring 9 L high flow nasal cannula supplementation. [facility] has been contacted today patient for further evaluation and management of acute on chronic respiratory failure with hypoxia.  Hospital Course:  1-acute on chronic respiratory failure with hypoxia -Multifactorial in the setting of COPD exacerbation, bronchiectasis/pneumonia and acute on chronic diastolic heart failure. -Patient expressed breathing is back to baseline, using 2 L nasal cannula supplementation and is speaking in full sentences.  Will discharge back to skilled nursing facility for further care. -Continue to follow daily weights, low-sodium diet and adjusted dose of Demadex at time of discharge. -Oxygen supplementation has been weaned back to his baseline; continue steroids tapering, oral doxycycline (for underlying bronchiectasis), as needed bronchodilators and resumption of Breo Ellipta. -Patient will alsocontinue using flutter valve and incentive respirometer as instructed..  2-type 2 diabetes with chronic renal failure stage 3b -Elevated CBGs appreciated in the setting of the steroids usage -Anticipate resolution of elevated blood sugars once his steroid therapy completed. -Resume home insulin regimen -Modified carbohydrate diet has been encouraged. -Continue minimizing as much as possible the use of nephrotoxic agents; close monitoring renal function trend especially with  adjusted dose of diuretics is recommended; patient will benefit of follow-up with nephrology service as an outpatient. -GFR appears to be at baseline; slight increase in creatinine level with acute diuresis  appreciated.  We might need to tolerate higher levels of creatinine in order to maintain better volume control.  3-essential hypertension -stable and well-controlled -Continue current antihypertensive regimen and follow vital signs. -Heart healthy diet has been ordered/recommended.  4-gastroesophageal flux disease -Continue PPI  5-diabetic polyneuropathy -Continue as needed analgesics and Neurontin.  6-morbid obesity -Body mass index is 42.82 kg/m. -Low calorie diet, portion control and increase physical activity discussed with patient.  7-hyperlipidemia -Continue statins -Heart healthy diet has been encouraged.  8-history of depression/anxiety -Mood appears to be stable -No suicidal ideation or hallucination -Continue the use of Prozac and as needed Xanax as per previous medication regimen..  9-history of gout -No acute flare appreciated -Continue the use of allopurinol.   Procedures: See below for x-ray reports 2-D echo: 1. Left ventricular ejection fraction, by estimation, is 55 to 60%. The  left ventricle has normal function. Left ventricular endocardial border  not optimally defined to evaluate regional wall motion. There is severe  left ventricular hypertrophy. Left  ventricular diastolic parameters are indeterminate.  2. Right ventricular systolic function is normal. The right ventricular  size is normal.  3. The mitral valve was not well visualized. No evidence of mitral valve  regurgitation. No evidence of mitral stenosis.  4. The aortic valve was not well visualized. Aortic valve regurgitation  is not visualized. No aortic stenosis is present.  5. Aortic dilatation noted. 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  6.  Technically difficult study, limited visualization     History of Present Illness  01/26/2020  Pulmonary/ 1st office eval/ Eric Scott / [facility]  Chief Complaint  Patient presents with   Pulmonary Consult    Referred by hospital- former Dr Luan Pulling pt.  He was recently admitted to [facility] 12/18/19 to 12/21/19 with hypoxia. He is on 3lpm o2 24/7.  He states his breathing has improved and he is overall doing well today.   Dyspnea:  Fully bed bound / hoyer needed bed to w/c  Cough: none  Sleep: hospital bed x 30 / same as prior  To admit  SABA use: prn = at hs  02 3lpm 24/7   No obvious day to day or daytime variability or assoc excess/ purulent sputum or mucus plugs or hemoptysis or cp or chest tightness, subjective wheeze or overt sinus or hb symptoms.   Sleeping as above without nocturnal  or early am exacerbation  of respiratory  c/o's or need for noct saba. Also denies any obvious fluctuation of symptoms with weather or environmental changes or other aggravating or alleviating factors except as outlined above   No unusual exposure hx or h/o childhood pna/ asthma or knowledge of premature birth.  Current Allergies, Complete Past Medical History, Past Surgical History, Family History, and Social History were reviewed in [employer] record.  ROS  The following are not active complaints unless bolded Hoarseness, sore throat, dysphagia, dental problems, itching, sneezing,  nasal congestion or discharge of excess mucus or purulent secretions, ear ache,   fever, chills, sweats, unintended wt loss or wt gain, classically pleuritic or exertional cp,  orthopnea pnd or arm/hand swelling  or leg swelling, presyncope, palpitations, abdominal pain, anorexia, nausea, vomiting, diarrhea  or change in bowel habits or change in bladder habits, change in stools or change in urine, dysuria, hematuria,  rash, arthralgias, visual complaints, headache, numbness, weakness or  ataxia or problems with walking or coordination,  change in  mood or  memory.           Past Medical History:  Diagnosis Date   Anemia    Anxiety    Chronic pain    legs, back; MRI 05/2012 with mild thoracic degenerative changes no spinal stenosis    CKD (chronic kidney disease) 05/12/2019   Stage IV   COPD (chronic obstructive pulmonary disease) ([place])    COVID-19    Depression    Diabetic peripheral neuropathy ([name])    "chronic" (54/27/0623)   Diastolic CHF ([name])    Duodenal ulcer hemorrhage    DVT (deep venous thrombosis) ([name])    right upper arm   GERD (gastroesophageal reflux disease)    Hypercholesteremia    Hypertension    Iron deficiency anemia    Osteomyelitis of ankle ([name])    Peripheral edema    PVD (peripheral vascular disease) ([place])    Renal disorder    Spinal stenosis    mild lumbar (MRI 05/2012)-L2-L3 to L4-L5 , mild lumbar foraminal stenosis    Stroke ([place]) 05/2012   Subacute, lacunar infarcts within the left basal ganglia and posterior limp of the left internal capsule/thalamus; "RUE; both feet weak" (05/27/2012)   Tremor    Type II diabetes mellitus ([name])     Outpatient Medications Prior to Visit  Medication Sig Dispense Refill   acetaminophen (TYLENOL) 325 MG tablet Take 2 tablets (650 mg total) by mouth every 6 (six) hours as needed for mild pain, fever or headache (or Fever >/= 101). 12 tablet 0   albuterol (PROVENTIL) (2.5 MG/3ML) 0.083% nebulizer solution Take 2.5 mg by nebulization in the morning and at bedtime.      allopurinol (ZYLOPRIM) 100 MG tablet Take 1 tablet by mouth daily. For gout     ALPRAZolam (XANAX) 0.5 MG tablet Take 0.5 mg by mouth at bedtime as needed for anxiety.     amLODipine (NORVASC) 10 MG tablet Take 1 tablet (10 mg total) by mouth daily. 30 tablet 2   aspirin EC 81 MG tablet Take 1 tablet (81 mg total) by mouth daily with breakfast. 30 tablet 11   BREO ELLIPTA 100-25 MCG/INH AEPB Inhale 2 puffs  into the lungs daily.      calcitRIOL (ROCALTROL) 0.25 MCG capsule Take 0.25 mcg by mouth daily.     calcium carbonate (TUMS - DOSED IN MG ELEMENTAL CALCIUM) 500 MG chewable tablet Chew 2 tablets by mouth 2 (two) times daily.     cholecalciferol (VITAMIN D) 1000 units tablet Take 5,000 Units by mouth daily.      cyanocobalamin 1000 MCG tablet Take 1,000 mcg by mouth daily.     fish oil-omega-3 fatty acids 1000 MG capsule Take 1 g by mouth 2 (two) times daily.     FLUoxetine (PROZAC) 20 MG capsule Take 20 mg by mouth daily.     gabapentin (NEURONTIN) 300 MG capsule Take 1 capsule (300 mg total) by mouth at bedtime. 30 capsule 1   HYDROcodone-acetaminophen (NORCO/VICODIN) 5-325 MG tablet Take 1 tablet by mouth every 12 (twelve) hours as needed for moderate pain or severe pain. (Patient taking differently: Take 1 tablet by mouth every 6 (six) hours as needed for moderate pain or severe pain. ) 15 tablet 0   insulin aspart (NOVOLOG) 100 UNIT/ML injection Inject 2-14 Units into the skin 3 (three) times daily before meals. Sliding scale 150-200=2units, 201-250=4units, 251-300=6units, 301-349=8units, 350-400=10 units, 401-450=12units, 451-500=14units before meals and at betime     lactulose (CHRONULAC) 10 GM/15ML  solution Take 15 mLs (10 g total) by mouth See admin instructions. --Please give 65mL [10 g total] every Monday and Friday due to tendency for high ammonia 236 mL 1   magnesium oxide (MAG-OX) 400 MG tablet Take 400 mg by mouth daily.     metoprolol succinate (TOPROL-XL) 50 MG 24 hr tablet Take 50 mg by mouth in the morning and at bedtime. Take with or immediately following a meal.     Multiple Vitamin (MULTIVITAMIN) tablet Take 1 tablet by mouth daily. For wound healing     polyethylene glycol (MIRALAX / GLYCOLAX) 17 g packet Take 17 g by mouth daily. For constipation     tamsulosin (FLOMAX) 0.4 MG CAPS capsule Take 1 capsule (0.4 mg total) by mouth daily after supper. 30 capsule 2    torsemide (DEMADEX) 20 MG tablet Take 2 tablets (40 mg total) by mouth 2 (two) times daily.     insulin glargine (LANTUS) 100 UNIT/ML injection Inject 0.32 mLs (32 Units total) into the skin at bedtime. 10 mL 1   pantoprazole (PROTONIX) 40 MG tablet Take 1 tablet (40 mg total) by mouth daily before breakfast. 30 tablet 0   ALPRAZolam (XANAX) 0.5 MG tablet Take 1 tablet (0.5 mg total) by mouth at bedtime as needed for anxiety or sleep. 10 tablet 0   dextromethorphan-guaiFENesin (MUCINEX DM) 30-600 MG 12hr tablet Take 1 tablet by mouth 2 (two) times daily. 40 tablet 0   HUMALOG KWIKPEN 100 UNIT/ML KiwkPen Inject 2-14 Units into the skin 2 (two) times daily. Per sliding scale 150-200= 2 units 201-250= 4 units 251-300= 6 units 301-349= 8 units 350-400=10units 401-450=12units 451-500=14units ABOVE 501- CALL MD BID- before meals and at bedtime. (Patient not taking: Reported on 12/18/2019)     Lidocaine 4 % PTCH Apply 1 patch topically daily. For pain         Objective:     BP 138/70 (BP Location: Left Arm, Cuff Size: Normal)    Pulse (!) 55    Temp 98.8 F (37.1 C) (Temporal)    Ht 6' (1.829 m)    Wt 299 lb (135.6 kg)    SpO2 98% Comment: 3lpm cont o2   BMI 40.55 kg/m   SpO2: 98 % (3lpm cont o2) O2 Type: Continuous O2 O2 Flow Rate (L/min): 3 L/min   Obese wm with resting tremor    HEENT : pt wearing mask not removed for exam due to covid - 19 concerns.    NECK :  without JVD/Nodes/TM/ nl carotid upstrokes bilaterally   LUNGS: no acc muscle use,  Mild barrel  contour chest wall with bilateral  Distant bs s audible wheeze and  without cough on insp or exp maneuvers  and mild  Hyperresonant  to  percussion bilaterally     CV:  RRR  no s3 or murmur or increase in P2, and  1-2+ pitting LE  edema   ABD: tensely obese  soft and nontender with pos end  insp Hoover's  in the supine position. No bruits or organomegaly appreciated, bowel sounds nl  MS:   Nl gait/  ext warm  without deformities, calf tenderness, cyanosis or clubbing No obvious joint restrictions   SKIN: warm and dry with Severe dry scaling stasis dermatitis changes both LE   NEURO:  alert, approp, nl sensorium with  no motor or cerebellar deficits apparent.         CXR PA and Lateral:   01/26/2020 :    I personally  reviewed images and agree with radiology impression as follows:    1. Clearing of previously noted bilateral lung infiltrates. 2. Right perihilar lung nodule is seen on multiple prior exams though slow growth over time (present since 2013)  3. Cardiomegaly    Assessment   COPD  GOLD ?  / AB  Quit smoking 12/2019  -  maint on BREO 275 one click each am as of 1/70/0174   Relatively well compensated at rest off 02. If he has further exac off cigs I would change to Trelegy 100 each am but beware in pts this sedentary of complications of constipation and BOO due to adding the anticholinergic LAMA.  Most iimportant now is for him to maintain cigarette abstinence going forward  F/u here can be 6 m, sooner if needed     Chronic respiratory failure with hypoxia ([place]) Although he looks like a retainer, his bicarbs are nl and appears well compensated at 3lpm   Because he is so chronically sedentary and able to sit up 30 degrees in hosp bed at NH he has no resting sob / desats and nothing further suggested.    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    Medical decision making was a moderate level of complexity in this case because of  > two chronic conditions /diagnoses requiring extra time for  H and P, chart review, counseling, teaching elipta device/  and generating customized AVS unique to this office visit and charting.     Christinia Gully, MD 01/26/2020

## 2020-01-27 ENCOUNTER — Encounter: Payer: Self-pay | Admitting: Internal Medicine

## 2020-01-27 NOTE — Assessment & Plan Note (Signed)
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    Medical decision making was a moderate level of complexity in this case because of  > two chronic conditions /diagnoses requiring extra time for  H and P, chart review, counseling, teaching elipta device/  and generating customized AVS unique to this office visit and charting.

## 2020-01-27 NOTE — Progress Notes (Signed)
Tried calling the pt and there was no answer- no option to leave msg, will call back.

## 2020-01-27 NOTE — Assessment & Plan Note (Signed)
Quit smoking 12/2019  -  maint on BREO 697 one click each am as of 9/48/0165   Relatively well compensated at rest off 02. If he has further exac off cigs I would change to Trelegy 100 each am but beware in pts this sedentary of complications of constipation and BOO due to adding the anticholinergic LAMA.  Most iimportant now is for him to maintain cigarette abstinence going forward  F/u here can be 6 m, sooner if needed

## 2020-01-27 NOTE — Assessment & Plan Note (Signed)
Although he looks like a retainer, his bicarbs are nl and appears well compensated at 3lpm   Because he is so chronically sedentary and able to sit up 30 degrees in hosp bed at NH he has no resting sob / desats and nothing further suggested.

## 2020-02-09 NOTE — Progress Notes (Signed)
Tried calling the pt and there was no answer- WCB

## 2020-02-11 ENCOUNTER — Encounter: Payer: Self-pay | Admitting: Internal Medicine

## 2020-03-02 ENCOUNTER — Other Ambulatory Visit: Payer: Self-pay

## 2020-03-02 ENCOUNTER — Encounter (HOSPITAL_COMMUNITY): Payer: Self-pay

## 2020-03-02 ENCOUNTER — Encounter (HOSPITAL_COMMUNITY)
Admission: RE | Admit: 2020-03-02 | Discharge: 2020-03-02 | Disposition: A | Discharge status: Home / Self Care | Payer: Medicare (Managed Care) | Source: Ambulatory Visit | Attending: Nephrology | Admitting: Nephrology

## 2020-03-02 DIAGNOSIS — D631 Anemia in chronic kidney disease: Secondary | ICD-10-CM | POA: Diagnosis not present

## 2020-03-02 DIAGNOSIS — N184 Chronic kidney disease, stage 4 (severe): Secondary | ICD-10-CM | POA: Diagnosis present

## 2020-03-02 LAB — POCT HEMOGLOBIN-HEMACUE: Hemoglobin: 9.2 g/dL — ABNORMAL LOW (ref 13.0–17.0)

## 2020-03-02 MED ORDER — EPOETIN ALFA 3000 UNIT/ML IJ SOLN
3000.0000 [IU] | Freq: Once | INTRAMUSCULAR | Status: AC
  Administered 2020-03-02: 3000 [IU] via SUBCUTANEOUS

## 2020-03-02 MED ORDER — EPOETIN ALFA 3000 UNIT/ML IJ SOLN
INTRAMUSCULAR | Status: AC
  Filled 2020-03-02: qty 1

## 2020-03-09 ENCOUNTER — Other Ambulatory Visit: Payer: Self-pay

## 2020-03-09 ENCOUNTER — Encounter (HOSPITAL_COMMUNITY)
Admission: RE | Admit: 2020-03-09 | Discharge: 2020-03-09 | Disposition: A | Discharge status: Home / Self Care | Payer: Medicare (Managed Care) | Source: Ambulatory Visit | Attending: Nephrology | Admitting: Nephrology

## 2020-03-09 ENCOUNTER — Encounter (HOSPITAL_COMMUNITY): Payer: Medicare (Managed Care)

## 2020-03-09 ENCOUNTER — Encounter (HOSPITAL_COMMUNITY): Payer: Self-pay

## 2020-03-09 DIAGNOSIS — D509 Iron deficiency anemia, unspecified: Secondary | ICD-10-CM | POA: Diagnosis not present

## 2020-03-09 DIAGNOSIS — N184 Chronic kidney disease, stage 4 (severe): Secondary | ICD-10-CM | POA: Insufficient documentation

## 2020-03-09 MED ORDER — SODIUM CHLORIDE 0.9 % IV SOLN: Freq: Once | INTRAVENOUS | Status: AC

## 2020-03-09 MED ORDER — SODIUM CHLORIDE 0.9 % IV SOLN
510.0000 mg | Freq: Once | INTRAVENOUS | Status: AC
  Administered 2020-03-09: 510 mg via INTRAVENOUS
  Filled 2020-03-09: qty 17

## 2020-03-15 ENCOUNTER — Encounter (HOSPITAL_COMMUNITY): Payer: Medicare (Managed Care)

## 2020-03-16 ENCOUNTER — Encounter (HOSPITAL_COMMUNITY)
Admission: RE | Admit: 2020-03-16 | Discharge: 2020-03-16 | Disposition: A | Discharge status: Home / Self Care | Payer: Medicare (Managed Care) | Source: Ambulatory Visit | Attending: Nephrology | Admitting: Nephrology

## 2020-03-16 ENCOUNTER — Other Ambulatory Visit: Payer: Self-pay

## 2020-03-16 ENCOUNTER — Encounter (HOSPITAL_COMMUNITY): Payer: Self-pay

## 2020-03-16 DIAGNOSIS — N184 Chronic kidney disease, stage 4 (severe): Secondary | ICD-10-CM | POA: Diagnosis not present

## 2020-03-16 LAB — POCT HEMOGLOBIN-HEMACUE: Hemoglobin: 11.2 g/dL — ABNORMAL LOW (ref 13.0–17.0)

## 2020-03-16 MED ORDER — EPOETIN ALFA 3000 UNIT/ML IJ SOLN: 3000.0000 [IU] | Freq: Once | INTRAMUSCULAR | Status: DC

## 2020-03-16 MED ORDER — SODIUM CHLORIDE 0.9 % IV SOLN: Freq: Once | INTRAVENOUS | Status: AC

## 2020-03-16 MED ORDER — SODIUM CHLORIDE 0.9 % IV SOLN
510.0000 mg | Freq: Once | INTRAVENOUS | Status: AC
  Administered 2020-03-16: 510 mg via INTRAVENOUS
  Filled 2020-03-16: qty 510

## 2020-03-30 ENCOUNTER — Encounter (HOSPITAL_COMMUNITY)
Admission: RE | Admit: 2020-03-30 | Discharge: 2020-03-30 | Disposition: A | Discharge status: Home / Self Care | Payer: Medicare (Managed Care) | Source: Ambulatory Visit | Attending: Nephrology | Admitting: Nephrology

## 2020-03-30 ENCOUNTER — Other Ambulatory Visit: Payer: Self-pay

## 2020-03-30 ENCOUNTER — Encounter (HOSPITAL_COMMUNITY): Payer: Self-pay

## 2020-03-30 DIAGNOSIS — N184 Chronic kidney disease, stage 4 (severe): Secondary | ICD-10-CM | POA: Diagnosis not present

## 2020-03-30 LAB — POCT HEMOGLOBIN-HEMACUE: Hemoglobin: 10.3 g/dL — ABNORMAL LOW (ref 13.0–17.0)

## 2020-03-30 MED ORDER — EPOETIN ALFA 3000 UNIT/ML IJ SOLN: 3000.0000 [IU] | Freq: Once | INTRAMUSCULAR | Status: DC

## 2020-04-13 ENCOUNTER — Encounter (HOSPITAL_COMMUNITY): Payer: Self-pay

## 2020-04-13 ENCOUNTER — Encounter (HOSPITAL_COMMUNITY)
Admission: RE | Admit: 2020-04-13 | Discharge: 2020-04-13 | Disposition: A | Discharge status: Home / Self Care | Payer: Medicare (Managed Care) | Source: Ambulatory Visit | Attending: Nephrology | Admitting: Nephrology

## 2020-04-13 ENCOUNTER — Other Ambulatory Visit: Payer: Self-pay

## 2020-04-13 DIAGNOSIS — N184 Chronic kidney disease, stage 4 (severe): Secondary | ICD-10-CM | POA: Insufficient documentation

## 2020-04-13 DIAGNOSIS — D631 Anemia in chronic kidney disease: Secondary | ICD-10-CM | POA: Diagnosis present

## 2020-04-13 LAB — POCT HEMOGLOBIN-HEMACUE: Hemoglobin: 9.5 g/dL — ABNORMAL LOW (ref 13.0–17.0)

## 2020-04-13 MED ORDER — EPOETIN ALFA 3000 UNIT/ML IJ SOLN
3000.0000 [IU] | Freq: Once | INTRAMUSCULAR | Status: AC
  Administered 2020-04-13: 3000 [IU] via SUBCUTANEOUS
  Filled 2020-04-13: qty 1

## 2020-04-27 ENCOUNTER — Encounter (HOSPITAL_COMMUNITY)
Admission: RE | Admit: 2020-04-27 | Discharge: 2020-04-27 | Disposition: A | Discharge status: Home / Self Care | Payer: Medicare (Managed Care) | Source: Ambulatory Visit | Attending: Nephrology | Admitting: Nephrology

## 2020-04-27 ENCOUNTER — Encounter (HOSPITAL_COMMUNITY): Payer: Self-pay

## 2020-04-27 ENCOUNTER — Other Ambulatory Visit: Payer: Self-pay

## 2020-04-27 DIAGNOSIS — N184 Chronic kidney disease, stage 4 (severe): Secondary | ICD-10-CM | POA: Diagnosis not present

## 2020-04-27 LAB — POCT HEMOGLOBIN-HEMACUE: Hemoglobin: 11.1 g/dL — ABNORMAL LOW (ref 13.0–17.0)

## 2020-04-27 MED ORDER — EPOETIN ALFA 3000 UNIT/ML IJ SOLN: 3000.0000 [IU] | Freq: Once | INTRAMUSCULAR | Status: DC

## 2020-05-10 ENCOUNTER — Other Ambulatory Visit: Payer: Self-pay

## 2020-05-10 ENCOUNTER — Emergency Department (HOSPITAL_COMMUNITY)
Admission: EM | Admit: 2020-05-10 | Discharge: 2020-05-10 | Disposition: A | Discharge status: Home / Self Care | Payer: Medicare (Managed Care) | Source: Home / Self Care | Attending: Emergency Medicine | Admitting: Emergency Medicine

## 2020-05-10 ENCOUNTER — Emergency Department (HOSPITAL_COMMUNITY): Payer: Medicare (Managed Care)

## 2020-05-10 ENCOUNTER — Encounter (HOSPITAL_COMMUNITY): Payer: Self-pay

## 2020-05-10 DIAGNOSIS — Z87891 Personal history of nicotine dependence: Secondary | ICD-10-CM | POA: Insufficient documentation

## 2020-05-10 DIAGNOSIS — N289 Disorder of kidney and ureter, unspecified: Secondary | ICD-10-CM

## 2020-05-10 DIAGNOSIS — J984 Other disorders of lung: Secondary | ICD-10-CM | POA: Insufficient documentation

## 2020-05-10 DIAGNOSIS — Z7982 Long term (current) use of aspirin: Secondary | ICD-10-CM | POA: Insufficient documentation

## 2020-05-10 DIAGNOSIS — I5030 Unspecified diastolic (congestive) heart failure: Secondary | ICD-10-CM | POA: Insufficient documentation

## 2020-05-10 DIAGNOSIS — Z86010 Personal history of colonic polyps: Secondary | ICD-10-CM | POA: Insufficient documentation

## 2020-05-10 DIAGNOSIS — R0602 Shortness of breath: Secondary | ICD-10-CM

## 2020-05-10 DIAGNOSIS — N185 Chronic kidney disease, stage 5: Secondary | ICD-10-CM | POA: Insufficient documentation

## 2020-05-10 DIAGNOSIS — I132 Hypertensive heart and chronic kidney disease with heart failure and with stage 5 chronic kidney disease, or end stage renal disease: Secondary | ICD-10-CM | POA: Insufficient documentation

## 2020-05-10 DIAGNOSIS — J441 Chronic obstructive pulmonary disease with (acute) exacerbation: Secondary | ICD-10-CM | POA: Insufficient documentation

## 2020-05-10 DIAGNOSIS — E1140 Type 2 diabetes mellitus with diabetic neuropathy, unspecified: Secondary | ICD-10-CM | POA: Insufficient documentation

## 2020-05-10 DIAGNOSIS — Z794 Long term (current) use of insulin: Secondary | ICD-10-CM | POA: Insufficient documentation

## 2020-05-10 DIAGNOSIS — E1122 Type 2 diabetes mellitus with diabetic chronic kidney disease: Secondary | ICD-10-CM | POA: Insufficient documentation

## 2020-05-10 DIAGNOSIS — Z20822 Contact with and (suspected) exposure to covid-19: Secondary | ICD-10-CM | POA: Insufficient documentation

## 2020-05-10 DIAGNOSIS — A4150 Gram-negative sepsis, unspecified: Secondary | ICD-10-CM | POA: Diagnosis not present

## 2020-05-10 DIAGNOSIS — Z8616 Personal history of COVID-19: Secondary | ICD-10-CM | POA: Insufficient documentation

## 2020-05-10 DIAGNOSIS — E1151 Type 2 diabetes mellitus with diabetic peripheral angiopathy without gangrene: Secondary | ICD-10-CM | POA: Insufficient documentation

## 2020-05-10 LAB — COMPREHENSIVE METABOLIC PANEL
ALT: 59 U/L — ABNORMAL HIGH (ref 0–44)
AST: 54 U/L — ABNORMAL HIGH (ref 15–41)
Albumin: 2.7 g/dL — ABNORMAL LOW (ref 3.5–5.0)
Alkaline Phosphatase: 163 U/L — ABNORMAL HIGH (ref 38–126)
Anion gap: 13 (ref 5–15)
BUN: 44 mg/dL — ABNORMAL HIGH (ref 8–23)
CO2: 28 mmol/L (ref 22–32)
Calcium: 7.4 mg/dL — ABNORMAL LOW (ref 8.9–10.3)
Chloride: 98 mmol/L (ref 98–111)
Creatinine, Ser: 3.61 mg/dL — ABNORMAL HIGH (ref 0.61–1.24)
GFR, Estimated: 17 mL/min — ABNORMAL LOW (ref >60)
Glucose, Bld: 192 mg/dL — ABNORMAL HIGH (ref 70–99)
Potassium: 3.2 mmol/L — ABNORMAL LOW (ref 3.5–5.1)
Sodium: 139 mmol/L (ref 135–145)
Total Bilirubin: 3.2 mg/dL — ABNORMAL HIGH (ref 0.3–1.2)
Total Protein: 7.3 g/dL (ref 6.5–8.1)

## 2020-05-10 LAB — CBC WITH DIFFERENTIAL/PLATELET
Abs Immature Granulocytes: 0.1 10*3/uL — ABNORMAL HIGH (ref 0.00–0.07)
Basophils Absolute: 0 10*3/uL (ref 0.0–0.1)
Basophils Relative: 0 %
Eosinophils Absolute: 0 10*3/uL (ref 0.0–0.5)
Eosinophils Relative: 0 %
HCT: 33.9 % — ABNORMAL LOW (ref 39.0–52.0)
Hemoglobin: 10.6 g/dL — ABNORMAL LOW (ref 13.0–17.0)
Immature Granulocytes: 1 %
Lymphocytes Relative: 2 %
Lymphs Abs: 0.4 10*3/uL — ABNORMAL LOW (ref 0.7–4.0)
MCH: 29.3 pg (ref 26.0–34.0)
MCHC: 31.3 g/dL (ref 30.0–36.0)
MCV: 93.6 fL (ref 80.0–100.0)
Monocytes Absolute: 0.6 10*3/uL (ref 0.1–1.0)
Monocytes Relative: 4 %
Neutro Abs: 15.2 10*3/uL — ABNORMAL HIGH (ref 1.7–7.7)
Neutrophils Relative %: 93 %
Platelets: 218 10*3/uL (ref 150–400)
RBC: 3.62 MIL/uL — ABNORMAL LOW (ref 4.22–5.81)
RDW: 15.9 % — ABNORMAL HIGH (ref 11.5–15.5)
WBC: 16.3 10*3/uL — ABNORMAL HIGH (ref 4.0–10.5)
nRBC: 0 % (ref 0.0–0.2)

## 2020-05-10 LAB — RESP PANEL BY RT-PCR (FLU A&B, COVID) ARPGX2
Influenza A by PCR: NEGATIVE
Influenza B by PCR: NEGATIVE
SARS Coronavirus 2 by RT PCR: NEGATIVE

## 2020-05-10 LAB — PROTIME-INR
INR: 1.3 — ABNORMAL HIGH (ref 0.8–1.2)
Prothrombin Time: 15.4 seconds — ABNORMAL HIGH (ref 11.4–15.2)

## 2020-05-10 LAB — LACTIC ACID, PLASMA
Lactic Acid, Venous: 1.5 mmol/L (ref 0.5–1.9)
Lactic Acid, Venous: 1 mmol/L (ref 0.5–1.9)

## 2020-05-10 NOTE — ED Provider Notes (Signed)
[facility] EMERGENCY DEPARTMENT Provider Note   CSN: [id number] Arrival date & time: 05/10/20  1839     History Chief Complaint  Patient presents with  . Shortness of Breath    Eric Scott is a 69 y.o. male.  HPI Patient presents for evaluation of shortness of breath and hypoxia.  Patient states his roommate turned off his oxygen prior to arrival of EMS.  He states he is comfortable in his baseline.  Patient has had a normal oxygenation since arrival here.  He denies recent problems including fever, vomiting or dizziness.  He states he is bedbound.      Past Medical History:  Diagnosis Date  . Anemia   . Anxiety   . Chronic pain    legs, back; MRI 05/2012 with mild thoracic degenerative changes no spinal stenosis   . 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 COPD (chronic obstructive pulmonary disease) ([place])   . COVID-19   . Depression   . Diabetic peripheral neuropathy ([place])    "chronic" (05/27/2012)  . Diastolic CHF ([place])   . Duodenal ulcer hemorrhage   . DVT (deep venous thrombosis) ([name])    right upper arm  . GERD (gastroesophageal reflux disease)   . Hypercholesteremia   . Hypertension   . Iron deficiency anemia   . Osteomyelitis of ankle ([place])   . Peripheral edema   . PVD (peripheral vascular disease) ([place])   . Renal disorder   . Spinal stenosis    mild lumbar (MRI 05/2012)-L2-L3 to L4-L5 , mild lumbar foraminal stenosis   . Stroke ([place]) 05/2012   Subacute, lacunar infarcts within the left basal ganglia and posterior limp of the left internal capsule/thalamus; "RUE; both feet weak" (05/27/2012)  . Tremor   . Type II diabetes mellitus [facility])     Patient Active Problem List   Diagnosis Date Noted  . COPD  GOLD ?  / AB  01/26/2020  . Acute on chronic respiratory failure with hypoxia ([place]) 12/18/2019  . Respiratory failure with hypoxia ([place]) 11/17/2019  . Normocytic anemia 11/16/2019  . Cellulitis of both lower extremities 11/16/2019  .  Chronic respiratory failure with hypoxia ([place]) 05/22/2019  . Leukocytosis 05/22/2019  . Chronic heart failure with preserved ejection fraction (HFpEF) ([place]) 05/22/2019  . Acute respiratory failure ([place]) 05/17/2019  . Obesity, Class III, BMI 40-49.9 (morbid obesity) ([place]) 05/17/2019  . Atrial flutter, paroxysmal ([place]) 05/17/2019  . COPD with acute exacerbation ([place]) 04/20/2019  . GERD (gastroesophageal reflux disease) 08/08/2018  . PUD (peptic ulcer disease)   . Gastric erosions 02/13/2017  . Duodenal ulcer 02/13/2017  . Transaminitis   . UGI bleed   . Arm DVT (deep venous thromboembolism), acute, right ([place]) 12/18/2016  . Uremia 12/10/2016  . Acute renal failure ([place])   . Volume depletion 11/23/2016  . Hypotension   . Liver enzyme elevation   . 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 ([place]) 11/15/2016  . Arterial insufficiency of lower extremity ([place]) 11/15/2016  . Pressure injury of skin 11/09/2016  . Sepsis ([place]) 11/09/2016  . Osteomyelitis ([place]) 11/09/2016  . Wounds, multiple 11/08/2016  . Chronic venous insufficiency 10/12/2016  . Critical lower limb ischemia ([place]) 10/12/2016  . Fatty liver 09/10/2016  . History of colonic polyps 09/10/2016  . Gallstone 09/10/2016  . Edema 03/04/2015  . AKI (acute kidney injury) ([place]) 03/04/2015  . Fall 03/04/2015  . Generalized weakness 03/04/2015  . Diabetic polyneuropathy associated  with type 2 diabetes mellitus ([place])   . Anxiety   . PVD (peripheral vascular disease) ([place])   . Pneumonia 07/20/2014  . Weight gain 05/02/2014  . Coarse tremors 04/18/2014  . Anxiety state 04/18/2014  . Acute respiratory failure with hypoxia ([place]) 04/07/2014  . Acute on chronic diastolic CHF (congestive heart failure) ([place]) 04/07/2014  . Solitary pulmonary nodule 04/07/2014  . COPD with exacerbation ([place]) 04/02/2014  . Hypoxia 04/02/2014  . Cerebral infarction ([place]) 06/01/2012  . Chronic pain (back,  legs) 05/30/2012  . Toe laceration, 4th toe 05/30/2012  . C. difficile diarrhea 05/30/2012  . Hypokalemia 05/30/2012  . Acute lacunar stroke ([place]) 05/27/2012  . Type 2 diabetes mellitus with stage 4 chronic kidney disease, with [place]-term current use of insulin ([place]) 05/27/2012  . Smoker 05/27/2012  . HTN (hypertension) 05/27/2012    Past Surgical History:  Procedure Laterality Date  . ANKLE SURGERY    . ESOPHAGOGASTRODUODENOSCOPY (EGD) WITH PROPOFOL N/A 12/21/2016   Procedure: ESOPHAGOGASTRODUODENOSCOPY (EGD) WITH PROPOFOL;  Surgeon: Daneil Dolin, MD;  Location: [facility] ENDO SUITE;  Service: Gastroenterology;  Laterality: N/A;  . ESOPHAGOGASTRODUODENOSCOPY (EGD) WITH PROPOFOL N/A 04/02/2017   Procedure: ESOPHAGOGASTRODUODENOSCOPY (EGD) WITH PROPOFOL;  Surgeon: Danie Binder, MD;  Location: [facility] END[facility] SUITE;  Service: Endoscopy;  Laterality: N/A;  1030   . HERNIA REPAIR  01/05/2004   "belly button" (05/27/2012)       Family History  Problem Relation Age of Onset  . Colon cancer Neg Hx     Social History   Tobacco Use  . Smoking status: Former Smoker    Packs/day: 2.00    Years: 54.00    Pack years: 108.00    Types: Cigarettes    Quit date: 12/12/2019    Years since quitting: 0.4  . Smokeless tobacco: Never Used  Vaping Use  . Vaping Use: Never used  Substance Use Topics  . Alcohol use: No    Comment: 05/27/2012 "drank gallons and gallons 20 yr ago or so; last drink  at least 10 yr ago"  . Drug use: No    Home Medications Prior to Admission medications   Medication Sig Start Date End Date Taking? Authorizing Provider  acetaminophen (TYLENOL) 325 MG tablet Take 2 tablets (650 mg total) by mouth every 6 (six) hours as needed for mild pain, fever or headache (or Fever >/= 101). 11/18/19   Roxan Hockey, MD  albuterol (PROVENTIL) (2.5 MG/3ML) 0.083% nebulizer solution Take 2.5 mg by nebulization in the morning and at bedtime.     [provider]  allopurinol  (ZYLOPRIM) 100 MG tablet Take 1 tablet by mouth daily. For gout    [provider]  ALPRAZolam (XANAX) 0.5 MG tablet Take 0.5 mg by mouth at bedtime as needed for anxiety.    [provider]  amLODipine (NORVASC) 10 MG tablet Take 1 tablet (10 mg total) by mouth daily. 05/21/19   Roxan Hockey, MD  aspirin EC 81 MG tablet Take 1 tablet (81 mg total) by mouth daily with breakfast. 11/18/19   Emokpae, Courage, MD  BREO ELLIPTA 100-25 MCG/INH AEPB Inhale 2 puffs into the lungs daily.  09/27/16   [provider]  calcitRIOL (ROCALTROL) 0.25 MCG capsule Take 0.25 mcg by mouth daily.    [provider]  calcium carbonate (TUMS - DOSED IN MG ELEMENTAL CALCIUM) 500 MG chewable tablet Chew 2 tablets by mouth 2 (two) times daily.    [provider]  cholecalciferol (VITAMIN D) 1000  units tablet Take 5,000 Units by mouth daily.     [provider]  cyanocobalamin 1000 MCG tablet Take 1,000 mcg by mouth daily.    [provider]  epoetin alfa (EPOGEN) 3000 UNIT/ML injection Inject 3,000 Units into the vein every 14 (fourteen) days. 03/02/20   Bhutani, Lavina Hamman, MD  fish oil-omega-3 fatty acids 1000 MG capsule Take 1 g by mouth 2 (two) times daily.    [provider]  FLUoxetine (PROZAC) 20 MG capsule Take 20 mg by mouth daily. 05/12/19   [provider]  gabapentin (NEURONTIN) 300 MG capsule Take 1 capsule (300 mg total) by mouth at bedtime. 12/21/19   Barton Dubois, MD  HYDROcodone-acetaminophen (NORCO/VICODIN) 5-325 MG tablet Take 1 tablet by mouth every 12 (twelve) hours as needed for moderate pain or severe pain. Patient taking differently: Take 1 tablet by mouth every 6 (six) hours as needed for moderate pain or severe pain.  11/18/19   Roxan Hockey, MD  insulin aspart (NOVOLOG) 100 UNIT/ML injection Inject 2-14 Units into the skin 3 (three) times daily before meals. Sliding scale 150-200=2units, 201-250=4units,  251-300=6units, 301-349=8units, 350-400=10 units, 401-450=12units, 451-500=14units before meals and at betime    [provider]  lactulose (CHRONULAC) 10 GM/15ML solution Take 15 mLs (10 g total) by mouth See admin instructions. --Please give 82mL [10 g total] every Monday and Friday due to tendency for high ammonia 11/18/19   Roxan Hockey, MD  magnesium oxide (MAG-OX) 400 MG tablet Take 400 mg by mouth daily.    [provider]  metoprolol succinate (TOPROL-XL) 50 MG 24 hr tablet Take 50 mg by mouth in the morning and at bedtime. Take with or immediately following a meal.    [provider]  Multiple Vitamin (MULTIVITAMIN) tablet Take 1 tablet by mouth daily. For wound healing    [provider]  pantoprazole (PROTONIX) 40 MG tablet Take 1 tablet (40 mg total) by mouth daily before breakfast. 05/21/19 12/18/19  Roxan Hockey, MD  polyethylene glycol (MIRALAX / GLYCOLAX) 17 g packet Take 17 g by mouth daily. For constipation    [provider]  tamsulosin (FLOMAX) 0.4 MG CAPS capsule Take 1 capsule (0.4 mg total) by mouth daily after supper. 11/18/19   Roxan Hockey, MD  torsemide (DEMADEX) 20 MG tablet Take 2 tablets (40 mg total) by mouth 2 (two) times daily. 12/21/19   Barton Dubois, MD    Allergies    Blueberry flavor, Cucumber extract, Flexeril [cyclobenzaprine hcl], and Kiwi extract  Review of Systems   Review of Systems  All other systems reviewed and are negative.   Physical Exam Updated Vital Signs BP 129/87 (BP Location: Right Arm)   Pulse 98   Temp 99.5 F (37.5 C) (Oral)   Resp (!) 22   Ht 6' (1.829 m)   Wt 130.2 kg   SpO2 96%   BMI 38.92 kg/m   Physical Exam Vitals and nursing note reviewed.  Constitutional:      General: He is not in acute distress.    Appearance: He is well-developed. He is obese. He is not ill-appearing, toxic-appearing or diaphoretic.  HENT:     Head: Normocephalic and atraumatic.     Right  Ear: External ear normal.     Left Ear: External ear normal.  Eyes:     Conjunctiva/sclera: Conjunctivae normal.     Pupils: Pupils are equal, round, and reactive to light.  Neck:     Trachea: Phonation normal.  Cardiovascular:     Rate and Rhythm: Normal rate and regular rhythm.     Heart sounds: Normal heart sounds.  Pulmonary:     Effort: Pulmonary effort is normal. No respiratory distress.     Breath sounds: Normal breath sounds. No stridor. No rhonchi.  Abdominal:     General: There is no distension.     Palpations: Abdomen is soft.     Tenderness: There is no abdominal tenderness.  Musculoskeletal:        General: Normal range of motion.     Cervical back: Normal range of motion and neck supple.     Right lower leg: Edema present.     Left lower leg: Edema present.     Comments: Nonspecific erythema bilateral anterior shins.  No drainage, bleeding or rash.  Skin:    General: Skin is warm and dry.  Neurological:     Mental Status: He is alert and oriented to person, place, and time.     Cranial Nerves: No cranial nerve deficit.     Sensory: No sensory deficit.     Motor: No abnormal muscle tone.     Coordination: Coordination normal.  Psychiatric:        Mood and Affect: Mood normal.        Behavior: Behavior normal.     ED Results / Procedures / Treatments   Labs (all labs ordered are listed, but only abnormal results are displayed) Labs Reviewed  COMPREHENSIVE METABOLIC PANEL - Abnormal; Notable for the following components:      Result Value   Potassium 3.2 (*)    Glucose, Bld 192 (*)    BUN 44 (*)    Creatinine, Ser 3.61 (*)    Calcium 7.4 (*)    Albumin 2.7 (*)    AST 54 (*)    ALT 59 (*)    Alkaline Phosphatase 163 (*)    Total Bilirubin 3.2 (*)    GFR, Estimated 17 (*)    All other components within normal limits  CBC WITH DIFFERENTIAL/PLATELET - Abnormal; Notable for the following components:   WBC 16.3 (*)    RBC 3.62 (*)    Hemoglobin 10.6 (*)     HCT 33.9 (*)    RDW 15.9 (*)    Neutro Abs 15.2 (*)    Lymphs Abs 0.4 (*)    Abs Immature Granulocytes 0.10 (*)    All other components within normal limits  PROTIME-INR - Abnormal; Notable for the following components:   Prothrombin Time 15.4 (*)    INR 1.3 (*)    All other components within normal limits  RESP PANEL BY RT-PCR (FLU A&B, COVID) ARPGX2  CULTURE, BLOOD (ROUTINE X 2)  CULTURE, BLOOD (ROUTINE X 2)  LACTIC ACID, PLASMA  LACTIC ACID, PLASMA  URINALYSIS, ROUTINE W REFLEX MICROSCOPIC    EKG None  Radiology DG Chest Port 1 View  Result Date: 05/10/2020 CLINICAL DATA:  Shortness of breath. EXAM: PORTABLE CHEST 1 VIEW COMPARISON:  01/26/2020 FINDINGS: Again noted is a rounded pulmonary nodule in the right upper lung zone. There is no pneumothorax. No large pleural effusion. The heart size remains enlarged. The trachea is midline. The lung volumes are slightly low. There is no acute osseous abnormality. Aortic calcifications are again noted. IMPRESSION: 1. No acute cardiopulmonary process. 2. Persistent rounded pulmonary nodule in the right upper lobe. This was present on the patient's CT from 1700 and is almost certainly a benign process. 3. Cardiomegaly with low  lung volumes. Electronically Signed   By: Constance Holster M.D.   On: 05/10/2020 19:54    Procedures Procedures (including critical care time)  Medications Ordered in ED Medications - No data to display  ED Course  I have reviewed the triage vital signs and the nursing notes.  Pertinent labs & imaging results that were available during my care of the patient were reviewed by me and considered in my medical decision making (see chart for details).    MDM Rules/Calculators/A&P                           Patient Vitals for the past 24 hrs:  BP Temp Temp src Pulse Resp SpO2 Height Weight  05/10/20 2000 129/87 99.5 F (37.5 C) Oral 98 (!) 22 96 % -- --  05/10/20 1930 140/85 -- -- (!) 103 (!) 21 98 % -- --   05/10/20 1900 138/63 -- -- (!) 105 12 91 % -- --  05/10/20 1846 (!) 144/99 100.3 F (37.9 C) Oral (!) 106 (!) 23 96 % -- --  05/10/20 1845 (!) 144/99 -- -- (!) 103 (!) 21 97 % -- --  05/10/20 1842 -- -- -- -- -- -- 6' (1.829 m) 130.2 kg    8:53 PM Reevaluation with update and discussion. After initial assessment and treatment, an updated evaluation reveals no change in clinical status, findings discussed with the patient.  Nursing informed family member, son on the phone of the plans. Daleen Bo   Medical Decision Making:  This patient is presenting for evaluation of hypoxia, which does require a range of treatment options, and is a complaint that involves a moderate risk of morbidity and mortality. The differential diagnoses include pneumonia, Covid infection, pulmonary edema, metabolic disorder. I decided to review old records, and in summary elderly male, living in a nursing home, presenting with hypoxia.  Patient reports that he was not on his oxygen prior to arrival of EMS who found him to be hypoxic.  He was transferred here and had stable oxygenation on nasal cannula after arrival..  I did not require additional historical information from anyone.  Clinical Laboratory Tests Ordered, included CBC, Metabolic panel and Covid test, PT/INR, lactic acid, blood culture. Review indicates lactic acid normal, white count elevated, hemoglobin low, potassium low, glucose high, BUN high, creatinine high, calcium low, albumin low, mild transaminitis. Radiologic Tests Ordered, included chest x-ray.  I independently Visualized: Radiograph images, which show no infiltrate or edema   Critical Interventions-shortness of breath with hypoxia, possible misuse of prescribed oxygen.  No evidence for pneumonia, heart failure, worsening chronic renal insufficiency, acute bacterial infection or evidence for impending hemodynamic collapse.  After These Interventions, the Patient was reevaluated and was found  stable for discharge to his nursing care facility.  No changes in medical treatment required at this time.  CRITICAL CARE-no Performed by: Daleen Bo  Nursing Notes Reviewed/ Care Coordinated Applicable Imaging Reviewed Interpretation of Laboratory Data incorporated into ED treatment  The patient appears reasonably screened and/or stabilized for discharge and I doubt any other medical condition or other [facility] requiring further screening, evaluation, or treatment in the ED at this time prior to discharge.  Plan: Home Medications-continue usual including oxygen; Home Treatments-regular activities; return here if the recommended treatment, does not improve the symptoms; Recommended follow up-PCP, as needed     Final Clinical Impression(s) / ED Diagnoses Final diagnoses:  Chronic lung disease  Renal insufficiency  Rx / DC Orders ED Discharge Orders    None       Daleen Bo, MD 05/10/20 2057

## 2020-05-10 NOTE — Discharge Instructions (Addendum)
Always use your oxygen, as directed by your doctor.  See your doctor as needed for problems

## 2020-05-10 NOTE — ED Notes (Signed)
Report called to Hilda Blades at [facility] [phone].

## 2020-05-10 NOTE — ED Notes (Signed)
Patient discharged to [facility].  All discharge instructions reviewed.  0 s/s acute distress.  Patient able to verbalize understanding via teachback method.

## 2020-05-10 NOTE — ED Triage Notes (Signed)
Pt brought to ED from [facility] for SOB. Pt chronically on 3L. Pt states SOB started yesterday. O2 sats 82% on 3L per EMS. Pt placed on cpap by EMS. Pt placed on 3L during triage sats 99%

## 2020-05-10 NOTE — ED Notes (Signed)
Received care of patient resting in bed.  0 s/s acute distress.  Call bell in reach.  Urinal provided to bedside and urine specimen requested from patient.

## 2020-05-11 ENCOUNTER — Encounter (HOSPITAL_COMMUNITY): Payer: Medicare (Managed Care)

## 2020-05-11 ENCOUNTER — Emergency Department (HOSPITAL_COMMUNITY): Payer: Medicare (Managed Care)

## 2020-05-11 ENCOUNTER — Inpatient Hospital Stay (HOSPITAL_COMMUNITY): Payer: Medicare (Managed Care)

## 2020-05-11 ENCOUNTER — Encounter (HOSPITAL_COMMUNITY): Payer: Self-pay

## 2020-05-11 ENCOUNTER — Encounter (HOSPITAL_COMMUNITY): Admission: RE | Admit: 2020-05-11 | Payer: Medicare (Managed Care) | Source: Ambulatory Visit

## 2020-05-11 ENCOUNTER — Other Ambulatory Visit: Payer: Self-pay

## 2020-05-11 ENCOUNTER — Inpatient Hospital Stay (HOSPITAL_COMMUNITY)
Admission: EM | Admit: 2020-05-11 | Discharge: 2020-05-14 | DRG: 872 | Disposition: A | Discharge status: Skilled Nursing Facility | Payer: Medicare (Managed Care) | Source: Skilled Nursing Facility | Attending: Internal Medicine | Admitting: Internal Medicine

## 2020-05-11 DIAGNOSIS — A4150 Gram-negative sepsis, unspecified: Secondary | ICD-10-CM | POA: Diagnosis present

## 2020-05-11 DIAGNOSIS — R748 Abnormal levels of other serum enzymes: Secondary | ICD-10-CM | POA: Diagnosis not present

## 2020-05-11 DIAGNOSIS — I10 Essential (primary) hypertension: Secondary | ICD-10-CM | POA: Diagnosis present

## 2020-05-11 DIAGNOSIS — F32A Depression, unspecified: Secondary | ICD-10-CM | POA: Diagnosis present

## 2020-05-11 DIAGNOSIS — E1151 Type 2 diabetes mellitus with diabetic peripheral angiopathy without gangrene: Secondary | ICD-10-CM | POA: Diagnosis present

## 2020-05-11 DIAGNOSIS — D649 Anemia, unspecified: Secondary | ICD-10-CM | POA: Diagnosis not present

## 2020-05-11 DIAGNOSIS — K8020 Calculus of gallbladder without cholecystitis without obstruction: Secondary | ICD-10-CM

## 2020-05-11 DIAGNOSIS — R109 Unspecified abdominal pain: Secondary | ICD-10-CM

## 2020-05-11 DIAGNOSIS — Z6838 Body mass index (BMI) 38.0-38.9, adult: Secondary | ICD-10-CM

## 2020-05-11 DIAGNOSIS — R1011 Right upper quadrant pain: Secondary | ICD-10-CM | POA: Diagnosis not present

## 2020-05-11 DIAGNOSIS — Z7401 Bed confinement status: Secondary | ICD-10-CM | POA: Diagnosis not present

## 2020-05-11 DIAGNOSIS — E785 Hyperlipidemia, unspecified: Secondary | ICD-10-CM | POA: Diagnosis present

## 2020-05-11 DIAGNOSIS — J9611 Chronic respiratory failure with hypoxia: Secondary | ICD-10-CM | POA: Diagnosis present

## 2020-05-11 DIAGNOSIS — E1122 Type 2 diabetes mellitus with diabetic chronic kidney disease: Secondary | ICD-10-CM | POA: Diagnosis present

## 2020-05-11 DIAGNOSIS — R7881 Bacteremia: Secondary | ICD-10-CM | POA: Diagnosis not present

## 2020-05-11 DIAGNOSIS — Z794 Long term (current) use of insulin: Secondary | ICD-10-CM

## 2020-05-11 DIAGNOSIS — K219 Gastro-esophageal reflux disease without esophagitis: Secondary | ICD-10-CM | POA: Diagnosis present

## 2020-05-11 DIAGNOSIS — D631 Anemia in chronic kidney disease: Secondary | ICD-10-CM | POA: Diagnosis present

## 2020-05-11 DIAGNOSIS — I5032 Chronic diastolic (congestive) heart failure: Secondary | ICD-10-CM | POA: Diagnosis present

## 2020-05-11 DIAGNOSIS — Z7982 Long term (current) use of aspirin: Secondary | ICD-10-CM | POA: Diagnosis not present

## 2020-05-11 DIAGNOSIS — N184 Chronic kidney disease, stage 4 (severe): Secondary | ICD-10-CM | POA: Diagnosis present

## 2020-05-11 DIAGNOSIS — Z7951 Long term (current) use of inhaled steroids: Secondary | ICD-10-CM

## 2020-05-11 DIAGNOSIS — Z91018 Allergy to other foods: Secondary | ICD-10-CM

## 2020-05-11 DIAGNOSIS — E1142 Type 2 diabetes mellitus with diabetic polyneuropathy: Secondary | ICD-10-CM | POA: Diagnosis present

## 2020-05-11 DIAGNOSIS — R0602 Shortness of breath: Secondary | ICD-10-CM | POA: Diagnosis present

## 2020-05-11 DIAGNOSIS — Z20822 Contact with and (suspected) exposure to covid-19: Secondary | ICD-10-CM | POA: Diagnosis present

## 2020-05-11 DIAGNOSIS — F419 Anxiety disorder, unspecified: Secondary | ICD-10-CM | POA: Diagnosis present

## 2020-05-11 DIAGNOSIS — K8309 Other cholangitis: Secondary | ICD-10-CM | POA: Diagnosis present

## 2020-05-11 DIAGNOSIS — E7800 Pure hypercholesterolemia, unspecified: Secondary | ICD-10-CM | POA: Diagnosis present

## 2020-05-11 DIAGNOSIS — G8929 Other chronic pain: Secondary | ICD-10-CM | POA: Diagnosis present

## 2020-05-11 DIAGNOSIS — Z79899 Other long term (current) drug therapy: Secondary | ICD-10-CM

## 2020-05-11 DIAGNOSIS — Z8673 Personal history of transient ischemic attack (TIA), and cerebral infarction without residual deficits: Secondary | ICD-10-CM

## 2020-05-11 DIAGNOSIS — E876 Hypokalemia: Secondary | ICD-10-CM | POA: Diagnosis present

## 2020-05-11 DIAGNOSIS — I130 Hypertensive heart and chronic kidney disease with heart failure and stage 1 through stage 4 chronic kidney disease, or unspecified chronic kidney disease: Secondary | ICD-10-CM | POA: Diagnosis present

## 2020-05-11 DIAGNOSIS — Z87891 Personal history of nicotine dependence: Secondary | ICD-10-CM

## 2020-05-11 DIAGNOSIS — M109 Gout, unspecified: Secondary | ICD-10-CM | POA: Diagnosis present

## 2020-05-11 DIAGNOSIS — I739 Peripheral vascular disease, unspecified: Secondary | ICD-10-CM | POA: Diagnosis present

## 2020-05-11 DIAGNOSIS — N289 Disorder of kidney and ureter, unspecified: Secondary | ICD-10-CM

## 2020-05-11 DIAGNOSIS — J441 Chronic obstructive pulmonary disease with (acute) exacerbation: Secondary | ICD-10-CM | POA: Diagnosis present

## 2020-05-11 DIAGNOSIS — Z888 Allergy status to other drugs, medicaments and biological substances status: Secondary | ICD-10-CM

## 2020-05-11 LAB — CBC WITH DIFFERENTIAL/PLATELET
Abs Immature Granulocytes: 0.06 10*3/uL (ref 0.00–0.07)
Basophils Absolute: 0 10*3/uL (ref 0.0–0.1)
Basophils Relative: 0 %
Eosinophils Absolute: 0 10*3/uL (ref 0.0–0.5)
Eosinophils Relative: 0 %
HCT: 31.3 % — ABNORMAL LOW (ref 39.0–52.0)
Hemoglobin: 9.8 g/dL — ABNORMAL LOW (ref 13.0–17.0)
Immature Granulocytes: 1 %
Lymphocytes Relative: 5 %
Lymphs Abs: 0.4 10*3/uL — ABNORMAL LOW (ref 0.7–4.0)
MCH: 29.1 pg (ref 26.0–34.0)
MCHC: 31.3 g/dL (ref 30.0–36.0)
MCV: 92.9 fL (ref 80.0–100.0)
Monocytes Absolute: 0.4 10*3/uL (ref 0.1–1.0)
Monocytes Relative: 5 %
Neutro Abs: 7 10*3/uL (ref 1.7–7.7)
Neutrophils Relative %: 89 %
Platelets: 184 10*3/uL (ref 150–400)
RBC: 3.37 MIL/uL — ABNORMAL LOW (ref 4.22–5.81)
RDW: 16.1 % — ABNORMAL HIGH (ref 11.5–15.5)
WBC: 7.8 10*3/uL (ref 4.0–10.5)
nRBC: 0 % (ref 0.0–0.2)

## 2020-05-11 LAB — BLOOD CULTURE ID PANEL (REFLEXED) - BCID2

## 2020-05-11 LAB — URINALYSIS, ROUTINE W REFLEX MICROSCOPIC
Bilirubin Urine: NEGATIVE
Glucose, UA: NEGATIVE mg/dL
Hgb urine dipstick: NEGATIVE
Ketones, ur: NEGATIVE mg/dL
Nitrite: NEGATIVE
Protein, ur: 30 mg/dL — AB
Specific Gravity, Urine: 1.012 (ref 1.005–1.030)
pH: 5 (ref 5.0–8.0)

## 2020-05-11 LAB — LACTIC ACID, PLASMA
Lactic Acid, Venous: 0.9 mmol/L (ref 0.5–1.9)
Lactic Acid, Venous: 1.5 mmol/L (ref 0.5–1.9)

## 2020-05-11 LAB — COMPREHENSIVE METABOLIC PANEL
ALT: 48 U/L — ABNORMAL HIGH (ref 0–44)
AST: 44 U/L — ABNORMAL HIGH (ref 15–41)
Albumin: 2.5 g/dL — ABNORMAL LOW (ref 3.5–5.0)
Alkaline Phosphatase: 155 U/L — ABNORMAL HIGH (ref 38–126)
Anion gap: 13 (ref 5–15)
BUN: 45 mg/dL — ABNORMAL HIGH (ref 8–23)
CO2: 27 mmol/L (ref 22–32)
Calcium: 7.4 mg/dL — ABNORMAL LOW (ref 8.9–10.3)
Chloride: 98 mmol/L (ref 98–111)
Creatinine, Ser: 3.73 mg/dL — ABNORMAL HIGH (ref 0.61–1.24)
GFR, Estimated: 17 mL/min — ABNORMAL LOW (ref >60)
Glucose, Bld: 186 mg/dL — ABNORMAL HIGH (ref 70–99)
Potassium: 2.9 mmol/L — ABNORMAL LOW (ref 3.5–5.1)
Sodium: 138 mmol/L (ref 135–145)
Total Bilirubin: 3.9 mg/dL — ABNORMAL HIGH (ref 0.3–1.2)
Total Protein: 6.8 g/dL (ref 6.5–8.1)

## 2020-05-11 LAB — PROCALCITONIN: Procalcitonin: 17.24 ng/mL

## 2020-05-11 LAB — APTT: aPTT: 33 seconds (ref 24–36)

## 2020-05-11 LAB — BILIRUBIN, DIRECT: Bilirubin, Direct: 2.7 mg/dL — ABNORMAL HIGH (ref 0.0–0.2)

## 2020-05-11 LAB — RESP PANEL BY RT-PCR (FLU A&B, COVID) ARPGX2
Influenza A by PCR: NEGATIVE
Influenza B by PCR: NEGATIVE
SARS Coronavirus 2 by RT PCR: NEGATIVE

## 2020-05-11 LAB — PROTIME-INR
INR: 1.3 — ABNORMAL HIGH (ref 0.8–1.2)
Prothrombin Time: 15.3 seconds — ABNORMAL HIGH (ref 11.4–15.2)

## 2020-05-11 MED ORDER — OMEGA-3 FATTY ACIDS 1000 MG PO CAPS: 1.0000 g | ORAL_CAPSULE | Freq: Two times a day (BID) | ORAL | Status: DC

## 2020-05-11 MED ORDER — VITAMIN B-12 1000 MCG PO TABS
1000.0000 ug | ORAL_TABLET | Freq: Every day | ORAL | Status: DC
  Administered 2020-05-12 – 2020-05-14 (×3): 1000 ug via ORAL
  Filled 2020-05-11 (×3): qty 1

## 2020-05-11 MED ORDER — VANCOMYCIN HCL IN DEXTROSE 1-5 GM/200ML-% IV SOLN
1000.0000 mg | Freq: Once | INTRAVENOUS | Status: DC
  Filled 2020-05-11: qty 200

## 2020-05-11 MED ORDER — TAMSULOSIN HCL 0.4 MG PO CAPS
0.4000 mg | ORAL_CAPSULE | Freq: Every day | ORAL | Status: DC
  Administered 2020-05-12 – 2020-05-14 (×3): 0.4 mg via ORAL
  Filled 2020-05-11 (×3): qty 1

## 2020-05-11 MED ORDER — FLUTICASONE FUROATE-VILANTEROL 100-25 MCG/INH IN AEPB
2.0000 | INHALATION_SPRAY | Freq: Every day | RESPIRATORY_TRACT | Status: DC
  Administered 2020-05-12 – 2020-05-14 (×3): 2 via RESPIRATORY_TRACT
  Filled 2020-05-11 (×2): qty 28

## 2020-05-11 MED ORDER — ALLOPURINOL 100 MG PO TABS
100.0000 mg | ORAL_TABLET | Freq: Every day | ORAL | Status: DC
  Administered 2020-05-12 – 2020-05-14 (×3): 100 mg via ORAL
  Filled 2020-05-11 (×4): qty 1

## 2020-05-11 MED ORDER — MORPHINE SULFATE (PF) 2 MG/ML IV SOLN
2.0000 mg | Freq: Once | INTRAVENOUS | Status: AC
  Administered 2020-05-11: 2 mg via INTRAVENOUS

## 2020-05-11 MED ORDER — ACETAMINOPHEN 325 MG PO TABS: 650.0000 mg | ORAL_TABLET | Freq: Four times a day (QID) | ORAL | Status: DC | PRN

## 2020-05-11 MED ORDER — SODIUM CHLORIDE 0.9 % IV SOLN
2.0000 g | Freq: Once | INTRAVENOUS | Status: AC
  Administered 2020-05-11: 2 g via INTRAVENOUS
  Filled 2020-05-11: qty 2

## 2020-05-11 MED ORDER — PANTOPRAZOLE SODIUM 40 MG PO TBEC
40.0000 mg | DELAYED_RELEASE_TABLET | Freq: Every day | ORAL | Status: DC
  Administered 2020-05-12 – 2020-05-14 (×3): 40 mg via ORAL
  Filled 2020-05-11 (×3): qty 1

## 2020-05-11 MED ORDER — ALPRAZOLAM 0.5 MG PO TABS
0.5000 mg | ORAL_TABLET | Freq: Every evening | ORAL | Status: DC | PRN
  Administered 2020-05-12 – 2020-05-13 (×2): 0.5 mg via ORAL
  Filled 2020-05-11 (×2): qty 1

## 2020-05-11 MED ORDER — POTASSIUM CHLORIDE CRYS ER 20 MEQ PO TBCR
40.0000 meq | EXTENDED_RELEASE_TABLET | Freq: Once | ORAL | Status: AC
  Administered 2020-05-11: 40 meq via ORAL
  Filled 2020-05-11: qty 2

## 2020-05-11 MED ORDER — MORPHINE SULFATE (PF) 2 MG/ML IV SOLN
INTRAVENOUS | Status: AC
  Administered 2020-05-11: 2 mg via INTRAVENOUS
  Filled 2020-05-11: qty 1

## 2020-05-11 MED ORDER — VANCOMYCIN HCL 2000 MG/400ML IV SOLN
2000.0000 mg | Freq: Once | INTRAVENOUS | Status: AC
  Administered 2020-05-11: 2000 mg via INTRAVENOUS
  Filled 2020-05-11: qty 400

## 2020-05-11 MED ORDER — METOPROLOL SUCCINATE ER 25 MG PO TB24: 50.0000 mg | ORAL_TABLET | Freq: Every day | ORAL | Status: DC

## 2020-05-11 MED ORDER — METHYLPREDNISOLONE SODIUM SUCC 125 MG IJ SOLR
80.0000 mg | Freq: Three times a day (TID) | INTRAMUSCULAR | Status: DC
  Administered 2020-05-11: 80 mg via INTRAVENOUS
  Filled 2020-05-11: qty 2

## 2020-05-11 MED ORDER — ADULT MULTIVITAMIN W/MINERALS CH
1.0000 | ORAL_TABLET | Freq: Every day | ORAL | Status: DC
  Administered 2020-05-12 – 2020-05-14 (×3): 1 via ORAL
  Filled 2020-05-11 (×3): qty 1

## 2020-05-11 MED ORDER — VITAMIN D 25 MCG (1000 UNIT) PO TABS
5000.0000 [IU] | ORAL_TABLET | Freq: Every day | ORAL | Status: DC
  Administered 2020-05-12 – 2020-05-14 (×3): 5000 [IU] via ORAL
  Filled 2020-05-11 (×3): qty 5

## 2020-05-11 MED ORDER — ALBUTEROL SULFATE (2.5 MG/3ML) 0.083% IN NEBU: 2.5000 mg | INHALATION_SOLUTION | RESPIRATORY_TRACT | Status: DC | PRN

## 2020-05-11 MED ORDER — GABAPENTIN 300 MG PO CAPS
300.0000 mg | ORAL_CAPSULE | Freq: Every day | ORAL | Status: DC
  Administered 2020-05-11 – 2020-05-13 (×3): 300 mg via ORAL
  Filled 2020-05-11 (×3): qty 1

## 2020-05-11 MED ORDER — IPRATROPIUM-ALBUTEROL 0.5-2.5 (3) MG/3ML IN SOLN
3.0000 mL | Freq: Four times a day (QID) | RESPIRATORY_TRACT | Status: DC
  Administered 2020-05-11 – 2020-05-12 (×2): 3 mL via RESPIRATORY_TRACT
  Filled 2020-05-11 (×2): qty 3

## 2020-05-11 MED ORDER — HEPARIN SODIUM (PORCINE) 5000 UNIT/ML IJ SOLN
5000.0000 [IU] | Freq: Three times a day (TID) | INTRAMUSCULAR | Status: DC
  Administered 2020-05-11 – 2020-05-14 (×9): 5000 [IU] via SUBCUTANEOUS
  Filled 2020-05-11 (×9): qty 1

## 2020-05-11 MED ORDER — VANCOMYCIN HCL 1750 MG/350ML IV SOLN: 1750.0000 mg | INTRAVENOUS | Status: DC

## 2020-05-11 MED ORDER — CALCITRIOL 0.25 MCG PO CAPS
0.2500 ug | ORAL_CAPSULE | Freq: Every day | ORAL | Status: DC
  Administered 2020-05-12 – 2020-05-14 (×3): 0.25 ug via ORAL
  Filled 2020-05-11 (×4): qty 1

## 2020-05-11 MED ORDER — POTASSIUM CHLORIDE 10 MEQ/100ML IV SOLN
10.0000 meq | INTRAVENOUS | Status: AC
  Administered 2020-05-11 (×4): 10 meq via INTRAVENOUS
  Filled 2020-05-11 (×4): qty 100

## 2020-05-11 MED ORDER — ACETAMINOPHEN 650 MG RE SUPP: 650.0000 mg | Freq: Four times a day (QID) | RECTAL | Status: DC | PRN

## 2020-05-11 MED ORDER — POTASSIUM CHLORIDE 10 MEQ/100ML IV SOLN
10.0000 meq | Freq: Once | INTRAVENOUS | Status: AC
  Administered 2020-05-11: 10 meq via INTRAVENOUS
  Filled 2020-05-11: qty 100

## 2020-05-11 MED ORDER — METHYLPREDNISOLONE SODIUM SUCC 40 MG IJ SOLR
40.0000 mg | Freq: Three times a day (TID) | INTRAMUSCULAR | Status: DC
  Administered 2020-05-12 (×2): 40 mg via INTRAVENOUS
  Filled 2020-05-11 (×2): qty 1

## 2020-05-11 MED ORDER — ALBUTEROL SULFATE HFA 108 (90 BASE) MCG/ACT IN AERS
4.0000 | INHALATION_SPRAY | Freq: Once | RESPIRATORY_TRACT | Status: AC
  Administered 2020-05-11: 4 via RESPIRATORY_TRACT
  Filled 2020-05-11: qty 6.7

## 2020-05-11 MED ORDER — LACTATED RINGERS IV SOLN: INTRAVENOUS | Status: AC

## 2020-05-11 MED ORDER — METRONIDAZOLE 500 MG PO TABS
500.0000 mg | ORAL_TABLET | Freq: Three times a day (TID) | ORAL | Status: DC
  Administered 2020-05-11 – 2020-05-12 (×3): 500 mg via ORAL
  Filled 2020-05-11 (×3): qty 1

## 2020-05-11 MED ORDER — ASPIRIN EC 81 MG PO TBEC
81.0000 mg | DELAYED_RELEASE_TABLET | Freq: Every day | ORAL | Status: DC
  Administered 2020-05-12 – 2020-05-14 (×3): 81 mg via ORAL
  Filled 2020-05-11 (×3): qty 1

## 2020-05-11 MED ORDER — FLUOXETINE HCL 20 MG PO CAPS
20.0000 mg | ORAL_CAPSULE | Freq: Every day | ORAL | Status: DC
  Administered 2020-05-12 – 2020-05-14 (×3): 20 mg via ORAL
  Filled 2020-05-11 (×3): qty 1

## 2020-05-11 MED ORDER — SODIUM CHLORIDE 0.9 % IV SOLN: 2.0000 g | INTRAVENOUS | Status: DC

## 2020-05-11 NOTE — ED Notes (Signed)
[employer] and spoke with the nurse and informed pt blood cultures came back positive for gram negative rods. [facility] will arrange transport for the patient to be brought back.

## 2020-05-11 NOTE — ED Triage Notes (Signed)
Pt brought to ED via [facility] for positive blood culture. Pt aerobic bottle positive for gram negative rods.

## 2020-05-11 NOTE — ED Notes (Signed)
CRITICAL VALUE ALERT  Critical Value:  Second positive blood culture  Date & Time Notied:  05/11/20 1858  Provider Notified: Dr. Waldron Labs  Orders Received/Actions taken: MD notified via text page

## 2020-05-11 NOTE — Progress Notes (Signed)
Pharmacy Antibiotic Note  Eric Scott is a 69 y.o. male admitted on 05/11/2020 with unknown source of infection.  Pharmacy has been consulted for Vancomycin and Cefepime dosing.  Plan: Vancomycin 2000 mg IV x 1 dose. Vancomycin 1750 mg IV every 48 hours. Expected AUC 524. Cefepime 2000 mg IV every 24 hours. Monitor labs, c/s, and vanco level as indicated.  Height: 6' (182.9 cm) Weight: 130.2 kg (287 lb) IBW/kg (Calculated) : 77.6  Temp (24hrs), Avg:99.3 F (37.4 C), Min:98.6 F (37 C), Max:100.3 F (37.9 C)  Recent Labs  Lab 05/10/20 1855 05/10/20 2048 05/11/20 1621  WBC 16.3*  --  7.8  CREATININE 3.61*  --   --   LATICACIDVEN 1.5 1.0  --     Estimated Creatinine Clearance: 26.9 mL/min (A) (by C-G formula based on SCr of 3.61 mg/dL (H)).    Allergies  Allergen Reactions  . Blueberry Flavor Other (See Comments)    Unknown  . Cucumber Extract Other (See Comments)    "Fells like I'm having a heart attack"  . Flexeril [Cyclobenzaprine Hcl] Other (See Comments)    "whole body  Tremors" (05/27/2012)  . Kiwi Extract Other (See Comments)    "feels like I'm having a heart attack"    Antimicrobials this admission: Vanco 12/8 >>  Cefepime 12/8 >>  Flagyl 12/8 >>   Microbiology results: 12/7 BCx: gram neg rods 12/8 UCx: pending    Thank you for allowing pharmacy to be a part of this patient's care.  Ramond Craver 05/11/2020 4:59 PM

## 2020-05-11 NOTE — ED Notes (Signed)
Patient transported to MRI 

## 2020-05-11 NOTE — ED Notes (Signed)
Date and time results received: 05/11/20 1217   Test: Blood culture, Aerobic bottle Critical Value: Gram negative rods  Name of Provider Notified: Sabra Heck

## 2020-05-11 NOTE — ED Provider Notes (Signed)
[facility] EMERGENCY DEPARTMENT Provider Note   CSN: [id number] Arrival date & time: 05/11/20  1516   History Chief Complaint  Patient presents with  . Abnormal Lab    Eric Scott is a 69 y.o. male.  The history is provided by the patient.  Abnormal Lab He has history of hypertension, diabetes, hyperlipidemia, diastolic heart failure, COPD, chronic kidney disease stage IV and was called back to the emergency department because of a positive blood culture.  He had been in the ED yesterday for shortness of breath and had a low-grade fever during his stay and blood cultures were ordered.  At home, he states he continues to have low-grade fevers, and nothing as high as 101.  He has had chills and sweats.  There is a cough which is nonproductive.  He denies sore throat, nausea, vomiting, diarrhea.  He denies any urinary difficulty.  He has been vaccinated against COVID-19.  Past Medical History:  Diagnosis Date  . Anemia   . Anxiety   . Chronic pain    legs, back; MRI 05/2012 with mild thoracic degenerative changes no spinal stenosis   . 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 COPD (chronic obstructive pulmonary disease) ([place])   . COVID-19   . Depression   . Diabetic peripheral neuropathy ([place])    "chronic" (05/27/2012)  . Diastolic CHF ([place])   . Duodenal ulcer hemorrhage   . DVT (deep venous thrombosis) ([name])    right upper arm  . GERD (gastroesophageal reflux disease)   . Hypercholesteremia   . Hypertension   . Iron deficiency anemia   . 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 Peripheral edema   . PVD (peripheral vascular disease) ([place])   . Renal disorder   . Spinal stenosis    mild lumbar (MRI 05/2012)-L2-L3 to L4-L5 , mild lumbar foraminal stenosis   . Stroke ([place]) 05/2012   Subacute, lacunar infarcts within the left basal ganglia and posterior limp of the left internal capsule/thalamus; "RUE; both feet weak" (05/27/2012)  . Tremor   . Type II diabetes  mellitus [facility])     Patient Active Problem List   Diagnosis Date Noted  . COPD  GOLD ?  / AB  01/26/2020  . Acute on chronic respiratory failure with hypoxia ([place]) 12/18/2019  . Respiratory failure with hypoxia ([place]) 11/17/2019  . Normocytic anemia 11/16/2019  . Cellulitis of both lower extremities 11/16/2019  . Chronic respiratory failure with hypoxia ([place]) 05/22/2019  . Leukocytosis 05/22/2019  . Chronic heart failure with preserved ejection fraction (HFpEF) ([place]) 05/22/2019  . Acute respiratory failure ([place]) 05/17/2019  . Obesity, Class III, BMI 40-49.9 (morbid obesity) ([place]) 05/17/2019  . Atrial flutter, paroxysmal ([place]) 05/17/2019  . COPD with acute exacerbation ([place]) 04/20/2019  . GERD (gastroesophageal reflux disease) 08/08/2018  . PUD (peptic ulcer disease)   . Gastric erosions 02/13/2017  . Duodenal ulcer 02/13/2017  . Transaminitis   . UGI bleed   . Arm DVT (deep venous thromboembolism), acute, right ([place]) 12/18/2016  . Uremia 12/10/2016  . Acute renal failure ([place])   . Volume depletion 11/23/2016  . Hypotension   . Liver enzyme elevation   . 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 ([place]) 11/15/2016  . Arterial insufficiency of lower extremity ([place]) 11/15/2016  . Pressure injury of skin 11/09/2016  . Sepsis ([place]) 11/09/2016  . Osteomyelitis ([place]) 11/09/2016  . Wounds, multiple 11/08/2016  . Chronic venous insufficiency  10/12/2016  . Critical lower limb ischemia ([place]) 10/12/2016  . Fatty liver 09/10/2016  . History of colonic polyps 09/10/2016  . Gallstone 09/10/2016  . Edema 03/04/2015  . AKI (acute kidney injury) ([place]) 03/04/2015  . Fall 03/04/2015  . Generalized weakness 03/04/2015  . Diabetic polyneuropathy associated with type 2 diabetes mellitus ([place])   . Anxiety   . PVD (peripheral vascular disease) ([place])   . Pneumonia 07/20/2014  . Weight gain 05/02/2014  . Coarse tremors 04/18/2014  . Anxiety state  04/18/2014  . Acute respiratory failure with hypoxia ([place]) 04/07/2014  . Acute on chronic diastolic CHF (congestive heart failure) ([place]) 04/07/2014  . Solitary pulmonary nodule 04/07/2014  . COPD with exacerbation ([place]) 04/02/2014  . Hypoxia 04/02/2014  . Cerebral infarction ([place]) 06/01/2012  . Chronic pain (back, legs) 05/30/2012  . Toe laceration, 4th toe 05/30/2012  . C. difficile diarrhea 05/30/2012  . Hypokalemia 05/30/2012  . Acute lacunar stroke ([place]) 05/27/2012  . Type 2 diabetes mellitus with stage 4 chronic kidney disease, with Castagnola-term current use of insulin ([place]) 05/27/2012  . Smoker 05/27/2012  . HTN (hypertension) 05/27/2012    Past Surgical History:  Procedure Laterality Date  . ANKLE SURGERY    . ESOPHAGOGASTRODUODENOSCOPY (EGD) WITH PROPOFOL N/A 12/21/2016   Procedure: ESOPHAGOGASTRODUODENOSCOPY (EGD) WITH PROPOFOL;  Surgeon: Daneil Dolin, MD;  Location: [facility] END[facility];  Service: Gastroenterology;  Laterality: N/A;  . ESOPHAGOGASTRODUODENOSCOPY (EGD) WITH PROPOFOL N/A 04/02/2017   Procedure: ESOPHAGOGASTRODUODENOSCOPY (EGD) WITH PROPOFOL;  Surgeon: Danie Binder, MD;  Location: [facility] ENDO SUITE;  Service: Endoscopy;  Laterality: N/A;  1030   . HERNIA REPAIR  01/05/2004   "belly button" (05/27/2012)       Family History  Problem Relation Age of Onset  . Colon cancer Neg Hx     Social History   Tobacco Use  . Smoking status: Former Smoker    Packs/day: 2.00    Years: 54.00    Pack years: 108.00    Types: Cigarettes    Quit date: 12/12/2019    Years since quitting: 0.4  . Smokeless tobacco: Never Used  Vaping Use  . Vaping Use: Never used  Substance Use Topics  . Alcohol use: No    Comment: 05/27/2012 "drank gallons and gallons 20 yr ago or so; last drink  at least 10 yr ago"  . Drug use: No    Home Medications Prior to Admission medications   Medication Sig Start Date End Date Taking? Authorizing Provider  acetaminophen (TYLENOL) 325 MG tablet  Take 2 tablets (650 mg total) by mouth every 6 (six) hours as needed for mild pain, fever or headache (or Fever >/= 101). 11/18/19   Roxan Hockey, MD  albuterol (PROVENTIL) (2.5 MG/3ML) 0.083% nebulizer solution Take 2.5 mg by nebulization in the morning and at bedtime.     [provider]  allopurinol (ZYLOPRIM) 100 MG tablet Take 1 tablet by mouth daily. For gout    [provider]  ALPRAZolam (XANAX) 0.5 MG tablet Take 0.5 mg by mouth at bedtime as needed for anxiety.    [provider]  amLODipine (NORVASC) 10 MG tablet Take 1 tablet (10 mg total) by mouth daily. 05/21/19   Roxan Hockey, MD  aspirin EC 81 MG tablet Take 1 tablet (81 mg total) by mouth daily with breakfast. 11/18/19   Emokpae, Courage, MD  BREO ELLIPTA 100-25 MCG/INH AEPB Inhale 2 puffs into the lungs daily.  09/27/16   [provider]  calcitRIOL (ROCALTROL) 0.25 MCG capsule Take 0.25 mcg by mouth daily.    [provider]  calcium carbonate (TUMS - DOSED IN MG ELEMENTAL CALCIUM) 500 MG chewable tablet Chew 2 tablets by mouth 2 (two) times daily.    [provider]  cholecalciferol (VITAMIN D) 1000 units tablet Take 5,000 Units by mouth daily.     [provider]  cyanocobalamin 1000 MCG tablet Take 1,000 mcg by mouth daily.    [provider]  epoetin alfa (EPOGEN) 3000 UNIT/ML injection Inject 3,000 Units into the vein every 14 (fourteen) days. 03/02/20   Bhutani, Lavina Hamman, MD  fish oil-omega-3 fatty acids 1000 MG capsule Take 1 g by mouth 2 (two) times daily.    [provider]  FLUoxetine (PROZAC) 20 MG capsule Take 20 mg by mouth daily. 05/12/19   [provider]  gabapentin (NEURONTIN) 300 MG capsule Take 1 capsule (300 mg total) by mouth at bedtime. 12/21/19   Barton Dubois, MD  HYDROcodone-acetaminophen (NORCO/VICODIN) 5-325 MG tablet Take 1 tablet by mouth every 12 (twelve) hours as needed for moderate pain or severe  pain. Patient taking differently: Take 1 tablet by mouth every 6 (six) hours as needed for moderate pain or severe pain.  11/18/19   Roxan Hockey, MD  insulin aspart (NOVOLOG) 100 UNIT/ML injection Inject 2-14 Units into the skin 3 (three) times daily before meals. Sliding scale 150-200=2units, 201-250=4units, 251-300=6units, 301-349=8units, 350-400=10 units, 401-450=12units, 451-500=14units before meals and at betime    [provider]  lactulose (CHRONULAC) 10 GM/15ML solution Take 15 mLs (10 g total) by mouth See admin instructions. --Please give 77mL [10 g total] every Monday and Friday due to tendency for high ammonia 11/18/19   Roxan Hockey, MD  magnesium oxide (MAG-OX) 400 MG tablet Take 400 mg by mouth daily.    [provider]  metoprolol succinate (TOPROL-XL) 50 MG 24 hr tablet Take 50 mg by mouth in the morning and at bedtime. Take with or immediately following a meal.    [provider]  Multiple Vitamin (MULTIVITAMIN) tablet Take 1 tablet by mouth daily. For wound healing    [provider]  pantoprazole (PROTONIX) 40 MG tablet Take 1 tablet (40 mg total) by mouth daily before breakfast. 05/21/19 12/18/19  Roxan Hockey, MD  polyethylene glycol (MIRALAX / GLYCOLAX) 17 g packet Take 17 g by mouth daily. For constipation    [provider]  tamsulosin (FLOMAX) 0.4 MG CAPS capsule Take 1 capsule (0.4 mg total) by mouth daily after supper. 11/18/19   Roxan Hockey, MD  torsemide (DEMADEX) 20 MG tablet Take 2 tablets (40 mg total) by mouth 2 (two) times daily. 12/21/19   Barton Dubois, MD    Allergies    Blueberry flavor, Cucumber extract, Flexeril [cyclobenzaprine hcl], and Kiwi extract  Review of Systems   Review of Systems  All other systems reviewed and are negative.   Physical Exam Updated Vital Signs BP (!) 142/64 (BP Location: Right Arm)   Pulse 84   Temp 98.6 F (37 C) (Oral)   Resp 18   Ht 6' (1.829 m)   Wt 130.2 kg    SpO2 98%   BMI 38.92 kg/m   Physical Exam Vitals and nursing note reviewed.   69 year old male, resting comfortably and in no acute distress. Vital signs are normal. Oxygen saturation is 98%, which is normal. Head is normocephalic and atraumatic. PERRLA, EOMI. Oropharynx is clear. Neck is nontender and supple without  adenopathy or JVD. Back is nontender and there is no CVA tenderness. Lungs have bibasilar rales without wheezes or rhonchi. Chest is nontender. Heart has regular rate and rhythm without murmur. Abdomen is soft, flat, nontender without masses or hepatosplenomegaly and peristalsis is normoactive. Extremities have no cyanosis or edema, full range of motion is present. Skin is warm and dry without rash. Neurologic: Mental status is normal, cranial nerves are intact, there are no motor or sensory deficits.  ED Results / Procedures / Treatments   Labs (all labs ordered are listed, but only abnormal results are displayed) Labs Reviewed  COMPREHENSIVE METABOLIC PANEL - Abnormal; Notable for the following components:      Result Value   Potassium 2.9 (*)    Glucose, Bld 186 (*)    BUN 45 (*)    Creatinine, Ser 3.73 (*)    Calcium 7.4 (*)    Albumin 2.5 (*)    AST 44 (*)    ALT 48 (*)    Alkaline Phosphatase 155 (*)    Total Bilirubin 3.9 (*)    GFR, Estimated 17 (*)    All other components within normal limits  CBC WITH DIFFERENTIAL/PLATELET - Abnormal; Notable for the following components:   RBC 3.37 (*)    Hemoglobin 9.8 (*)    HCT 31.3 (*)    RDW 16.1 (*)    Lymphs Abs 0.4 (*)    All other components within normal limits  PROTIME-INR - Abnormal; Notable for the following components:   Prothrombin Time 15.3 (*)    INR 1.3 (*)    All other components within normal limits  URINE CULTURE  RESP PANEL BY RT-PCR (FLU A&B, COVID) ARPGX2  LACTIC ACID, PLASMA  APTT  LACTIC ACID, PLASMA  URINALYSIS, ROUTINE W REFLEX MICROSCOPIC    EKG EKG  Interpretation  Date/Time:  Wednesday May 11 2020 15:57:59 EST Ventricular Rate:  127 PR Interval:    QRS Duration: 134 QT Interval:  331 QTC Calculation: 419 R Axis:   -27 Text Interpretation: Undetermined rhythm Nonspecific intraventricular conduction delay Artifact When compared with ECG of 12/20/2019, No significant change was found Confirmed by Delora Fuel ([id number]) on 05/11/2020 4:02:19 PM   Radiology CT ABDOMEN PELVIS WO CONTRAST  Result Date: 05/11/2020 CLINICAL DATA:  69 year old male with history of elevated liver function tests. Cholelithiasis. Generalized weakness, fatigue and diffuse abdominal pain, most severe on the right side for the past 2 days. EXAM: CT ABDOMEN AND PELVIS WITHOUT CONTRAST TECHNIQUE: Multidetector CT imaging of the abdomen and pelvis was performed following the standard protocol without IV contrast. COMPARISON:  CT the abdomen and pelvis 11/23/2016. FINDINGS: Lower chest: Atherosclerotic calcifications in the descending thoracic aorta as well as the right coronary artery. Small amount of pericardial fluid and/or thickening, wound with intermediate attenuation fluid. No definite pericardial calcification. Hepatobiliary: Diffuse low attenuation, indicative throughout the of hepatic steatosis. Hepatic parenchyma multiple faintly peripherally calcified gallstones measuring up to 2.7 cm in the gallbladder. Gallbladder does not appear overly distended. No pericholecystic fluid or inflammatory changes. Pancreas: No definite pancreatic mass or peripancreatic fluid collections or inflammatory changes are noted on today's noncontrast CT examination. Spleen: Unremarkable. Adrenals/Urinary Tract: Low-attenuation adrenal form thickening in the adrenal glands bilaterally, likely to represent adenomatous hyperplasia. Multiple small renal lesions bilaterally, incompletely characterized on today's noncontrast CT but ranging from low to intermediate attenuation. No  hydroureteronephrosis. Urinary bladder is unremarkable in appearance. Stomach/Bowel: Normal appearance of the stomach. No pathologic dilatation of small bowel or colon.  Numerous colonic diverticulae are noted, without surrounding inflammatory changes to suggest an acute diverticulitis at this time. Normal appendix. Vascular/Lymphatic: Aortic atherosclerosis. Multiple prominent borderline enlarged and mildly enlarged upper abdominal lymph nodes are noted, measuring up to 1.7 cm in short axis in the portacaval nodal station. Reproductive: Prostate gland and seminal vesicles are unremarkable in appearance. Other: No significant volume of ascites.  No pneumoperitoneum. Musculoskeletal: There are no aggressive appearing lytic or blastic lesions noted in the visualized portions of the skeleton. IMPRESSION: 1. Cholelithiasis without definitive findings to suggest an acute cholecystitis at this time. 2. Colonic diverticulosis without evidence of acute diverticulitis at this time. 3. New intermediate attenuation small amount of pericardial fluid and/or thickening. Clinical correlation for signs and symptoms of pericarditis is recommended. 4. Multiple prominent borderline enlarged and mildly enlarged upper abdominal lymph nodes measuring up to 1.7 cm in short axis in the portacaval nodal station, nonspecific, but likely benign (the portacaval lymph node did not demonstrate diffusion restriction on the contemporaneously obtained abdominal MRI). 5. 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 6. Aortic atherosclerosis, in addition to at least right coronary artery disease. Please note that although the presence of coronary artery calcium documents the presence of coronary artery disease, the severity of this disease and any potential stenosis cannot be assessed on this non-gated CT examination. Assessment for potential risk factor modification, dietary therapy or pharmacologic therapy may be warranted, if clinically  indicated. 7. Additional incidental findings, as above. Electronically Signed   By: Vinnie Langton M.D.   On: 05/11/2020 20:16   MR ABDOMEN MRCP WO CONTRAST  Result Date: 05/11/2020 CLINICAL DATA:  69 year old male with history of jaundice. Abdominal pain, most severe in the right upper quadrant. Fever, chills and dyspnea. EXAM: MRI ABDOMEN WITHOUT CONTRAST  (INCLUDING MRCP) TECHNIQUE: Multiplanar multisequence MR imaging of the abdomen was performed. Heavily T2-weighted images of the biliary and pancreatic ducts were obtained, and three-dimensional MRCP images were rendered by post processing. COMPARISON:  No priors. FINDINGS: Comment: Today's study is limited for detection and characterization of visceral and/or vascular lesions by lack of IV gadolinium. Lower chest: Unremarkable. Hepatobiliary: No definite suspicious cystic or solid hepatic lesions are confidently identified on today's noncontrast examination. No intra or extrahepatic biliary ductal dilatation. Common bile duct measures 4 mm in the porta hepatis. No filling defect in the common bile duct to suggest choledocholithiasis. Numerous filling defects are noted within the gallbladder, compatible with gallstones measuring up to 2.4 cm. Gallbladder is only moderately distended. No definite gallbladder wall thickening or pericholecystic fluid or inflammatory changes. Pancreas: No definite pancreatic mass or peripancreatic fluid collections or inflammatory changes. No pancreatic ductal dilatation noted on MRCP images. Spleen:  Unremarkable. Adrenals/Urinary Tract: Multiple T1 hypointense and T2 hyperintense lesions are noted in the kidneys, incompletely characterized on today's noncontrast examination, but statistically likely to represent cysts, measuring up to 2.1 cm in the upper pole the left kidney. No hydroureteronephrosis in the visualized portions of the abdomen. Adrenal form thickening of the adrenal glands bilaterally which appear to lose  signal intensity on out of phase dual echo images, indicative of adenomatous hyperplasia. Stomach/Bowel: Visualized portions are unremarkable. Vascular/Lymphatic: No aneurysm identified in the visualized abdominal vasculature. Mildly enlarged portacaval lymph node measuring 1.7 cm in short axis, without diffusion restriction. Other: No significant volume of ascites noted in the visualized portions of the peritoneal cavity. Musculoskeletal: No aggressive appearing osseous lesions are noted in the visualized portions of the skeleton. IMPRESSION: 1. Cholelithiasis without evidence  of acute cholecystitis at this time. 2. No choledocholithiasis. No findings of biliary tract obstruction. 3. 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 Additional incidental findings, as above. Electronically Signed   By: Vinnie Langton M.D.   On: 05/11/2020 20:15   MR 3D Recon At Scanner  Result Date: 05/11/2020 CLINICAL DATA:  69 year old male with history of jaundice. Abdominal pain, most severe in the right upper quadrant. Fever, chills and dyspnea. EXAM: MRI ABDOMEN WITHOUT CONTRAST  (INCLUDING MRCP) TECHNIQUE: Multiplanar multisequence MR imaging of the abdomen was performed. Heavily T2-weighted images of the biliary and pancreatic ducts were obtained, and three-dimensional MRCP images were rendered by post processing. COMPARISON:  No priors. FINDINGS: Comment: Today's study is limited for detection and characterization of visceral and/or vascular lesions by lack of IV gadolinium. Lower chest: Unremarkable. Hepatobiliary: No definite suspicious cystic or solid hepatic lesions are confidently identified on today's noncontrast examination. No intra or extrahepatic biliary ductal dilatation. Common bile duct measures 4 mm in the porta hepatis. No filling defect in the common bile duct to suggest choledocholithiasis. Numerous filling defects are noted within the gallbladder, compatible with gallstones measuring up to  2.4 cm. Gallbladder is only moderately distended. No definite gallbladder wall thickening or pericholecystic fluid or inflammatory changes. Pancreas: No definite pancreatic mass or peripancreatic fluid collections or inflammatory changes. No pancreatic ductal dilatation noted on MRCP images. Spleen:  Unremarkable. Adrenals/Urinary Tract: Multiple T1 hypointense and T2 hyperintense lesions are noted in the kidneys, incompletely characterized on today's noncontrast examination, but statistically likely to represent cysts, measuring up to 2.1 cm in the upper pole the left kidney. No hydroureteronephrosis in the visualized portions of the abdomen. Adrenal form thickening of the adrenal glands bilaterally which appear to lose signal intensity on out of phase dual echo images, indicative of adenomatous hyperplasia. Stomach/Bowel: Visualized portions are unremarkable. Vascular/Lymphatic: No aneurysm identified in the visualized abdominal vasculature. Mildly enlarged portacaval lymph node measuring 1.7 cm in short axis, without diffusion restriction. Other: No significant volume of ascites noted in the visualized portions of the peritoneal cavity. Musculoskeletal: No aggressive appearing osseous lesions are noted in the visualized portions of the skeleton. IMPRESSION: 1. Cholelithiasis without evidence of acute cholecystitis at this time. 2. No choledocholithiasis. No findings of biliary tract obstruction. 3. 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 Additional incidental findings, as above. Electronically Signed   By: Vinnie Langton M.D.   On: 05/11/2020 20:15   DG Chest Port 1 View  Result Date: 05/11/2020 CLINICAL DATA:  Shortness of breath x2 days. EXAM: PORTABLE CHEST 1 VIEW COMPARISON:  May 10, 2020 FINDINGS: Mild, stable diffuse chronic appearing increased interstitial lung markings are seen. There is no evidence of acute infiltrate, pleural effusion or pneumothorax. The cardiac  silhouette is markedly enlarged and unchanged in size. There is moderate severity calcification of the aortic arch. Degenerative changes are seen throughout the thoracic spine. IMPRESSION: Stable cardiomegaly without evidence of acute or active cardiopulmonary disease. Electronically Signed   By: Virgina Norfolk M.D.   On: 05/11/2020 17:40   DG Chest Port 1 View  Result Date: 05/10/2020 CLINICAL DATA:  Shortness of breath. EXAM: PORTABLE CHEST 1 VIEW COMPARISON:  01/26/2020 FINDINGS: Again noted is a rounded pulmonary nodule in the right upper lung zone. There is no pneumothorax. No large pleural effusion. The heart size remains enlarged. The trachea is midline. The lung volumes are slightly low. There is no acute osseous abnormality. Aortic calcifications are again noted. IMPRESSION: 1. No  acute cardiopulmonary process. 2. Persistent rounded pulmonary nodule in the right upper lobe. This was present on the patient's CT from 9826 and is almost certainly a benign process. 3. Cardiomegaly with low lung volumes. Electronically Signed   By: Constance Holster M.D.   On: 05/10/2020 19:54    Procedures Procedures  CRITICAL CARE Performed by: Delora Fuel Total critical care time: 45 minutes Critical care time was exclusive of separately billable procedures and treating other patients. Critical care was necessary to treat or prevent imminent or life-threatening deterioration. Critical care was time spent personally by me on the following activities: development of treatment plan with patient and/or surrogate as well as nursing, discussions with consultants, evaluation of patient's response to treatment, examination of patient, obtaining history from patient or surrogate, ordering and performing treatments and interventions, ordering and review of laboratory studies, ordering and review of radiographic studies, pulse oximetry and re-evaluation of patient's condition.  Medications Ordered in ED Medications   lactated ringers infusion (has no administration in time range)  ceFEPIme (MAXIPIME) 2 g in sodium chloride 0.9 % 100 mL IVPB (has no administration in time range)  metroNIDAZOLE (FLAGYL) tablet 500 mg (has no administration in time range)  vancomycin (VANCOCIN) IVPB 1000 mg/200 mL premix (has no administration in time range)    ED Course  I have reviewed the triage vital signs and the nursing notes.  Pertinent labs & imaging results that were available during my care of the patient were reviewed by me and considered in my medical decision making (see chart for details).  MDM Rules/Calculators/A&P Positive blood culture without signs of sepsis.  Old records reviewed confirming ED visit yesterday with maximum temperature recorded at 100.3.  Chest x-ray yesterday showed no evidence of pneumonia.  No urine sample was obtained.  Blood culture is growing gram-negative rods.  He is started on sepsis pathway.  He is started on antibiotics for sepsis of undetermined etiology although I suspect urinary tract infection as likely source.  5:39 PM Chest x-ray shows no obvious pneumonia.  WBC is normal but with a left shift.  Anemia is present with slight drop in hemoglobin compared with yesterday, but in the range that he normally runs.  Potassium is very low and he will be given both oral and IV potassium.  Potassium was also low yesterday.  Elevation of transaminases, bilirubin, alkaline phosphatase is stable.  He is now complaining of feeling short of breath.  On reexam, he has significant wheezing and will be given albuterol via inhaler.  Case is discussed with Dr. Waldron [facility], who agrees to come to see the patient and admit him.  Looking at labs further back, liver enzymes have been normal in July.  Will get right upper quadrant ultrasound.  Ultrasound is not available, patient sent for CT scan as well as MRI scan of abdomen.  These show cholelithiasis without evidence of  cholecystitis.  Final Clinical Impression(s) / ED Diagnoses Final diagnoses:  Gram-negative bacteremia  Diuretic-induced hypokalemia  Elevated liver enzymes  Renal insufficiency  Normochromic normocytic anemia    Rx / DC Orders ED Discharge Orders    None       Delora Fuel, MD 41/58/30 [phone]

## 2020-05-11 NOTE — H&P (Addendum)
TRH H&P   Patient Demographics:    Eric Scott, is a 69 y.o. male  MRN: [id number]   DOB - 1950-06-22  Admit Date - 05/11/2020  Outpatient Primary MD for the patient is [name], MD  Referring MD/NP/PA: Dr Roxanne Mins  Patient coming from: SNF   Chief Complaint  Patient presents with  . Abnormal Lab      HPI:    Eric Scott  is a 69 y.o. male, with medical history significant of anemia of chronic kidney disease, stage IV-V renal failure, type 2 diabetes with nephropathy/neuropathy, morbid obesity, gastroesophageal reflux disease, hypertension, hyperlipidemia, chronic diastolic heart failure and history of peripheral vascular disease. -Patient called back to come to ED given positive blood culture, patient was in ED for evaluation yesterday for fever, work-up has been done, no significant findings, so he was sent back to SNF, his blood cultures are growing gram-negative rod, so patient was called back by ED for further evaluation, upon questioning it does appear he is having fever, chills, he does report some dyspnea as well, he is reporting abdominal pain, mainly in the right upper quadrant, but so far he denies any nausea, vomiting, diarrhea or constipation. - in ED his work-up was significant for elevated liver enzymes, with total bili of 3.9, alk phos of 155, hypokalemia with potassium of 2.9, creatinine at baseline of 3.73, [place] ptosis yesterday 10.3, has resolved today to 7.8, x-rays pending, called for evaluation, UA still pending, and further work-up for his LFTs is pending as well.    Review of systems:    In addition to the HPI above, Reports fever and chills fever 101 at the facility, report generalized weakness, fatigue, reports he has tremors at baseline no Headache, No changes with Vision or hearing, No problems swallowing food or Liquids, No Chest pain, does report  some dyspnea, but no cough Complains of abdominal pain, but no nausea, no vomiting or no diarrhea . No Blood in stool or Urine, No dysuria, No new skin rashes or bruises, No new joints pains-aches,  No new focal weakness, tingling, numbness in any extremity, No recent weight gain or loss, No polyuria, polydypsia or polyphagia, No significant Mental Stressors.  A full 10 point Review of Systems was done, except as stated above, all other Review of Systems were negative.   With Past History of the following :    Past Medical History:  Diagnosis Date  . Anemia   . Anxiety   . Chronic pain    legs, back; MRI 05/2012 with mild thoracic degenerative changes no spinal stenosis   . 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 COPD (chronic obstructive pulmonary disease) ([place])   . COVID-19   . Depression   . Diabetic peripheral neuropathy ([place])    "chronic" (05/27/2012)  . Diastolic CHF ([place])   . Duodenal ulcer hemorrhage   .  DVT (deep venous thrombosis) ([name])    right upper arm  . GERD (gastroesophageal reflux disease)   . Hypercholesteremia   . Hypertension   . Iron deficiency anemia   . Osteomyelitis of ankle ([place])   . Peripheral edema   . PVD (peripheral vascular disease) ([place])   . Renal disorder   . Spinal stenosis    mild lumbar (MRI 05/2012)-L2-L3 to L4-L5 , mild lumbar foraminal stenosis   . Stroke ([place]) 05/2012   Subacute, lacunar infarcts within the left basal ganglia and posterior limp of the left internal capsule/thalamus; "RUE; both feet weak" (05/27/2012)  . Tremor   . Type II diabetes mellitus ([place])       Past Surgical History:  Procedure Laterality Date  . ANKLE SURGERY    . ESOPHAGOGASTRODUODENOSCOPY (EGD) WITH PROPOFOL N/A 12/21/2016   Procedure: ESOPHAGOGASTRODUODENOSCOPY (EGD) WITH PROPOFOL;  Surgeon: Daneil Dolin, MD;  [facility];  Service: Gastroenterology;  Laterality: N/A;  . ESOPHAGOGASTRODUODENOSCOPY (EGD) WITH PROPOFOL  N/A 04/02/2017   Procedure: ESOPHAGOGASTRODUODENOSCOPY (EGD) WITH PROPOFOL;  Surgeon: Danie Binder, MD;  Location: [facility];  Service: Endoscopy;  Laterality: N/A;  1030   . HERNIA REPAIR  01/05/2004   "belly button" (05/27/2012)      Social History:     Social History   Tobacco Use  . Smoking status: Former Smoker    Packs/day: 2.00    Years: 54.00    Pack years: 108.00    Types: Cigarettes    Quit date: 12/12/2019    Years since quitting: 0.4  . Smokeless tobacco: Never Used  Substance Use Topics  . Alcohol use: No    Comment: 05/27/2012 "drank gallons and gallons 20 yr ago or so; last drink  at least 10 yr ago"        Family History :     Family History  Problem Relation Age of Onset  . Colon cancer Neg Hx       Home Medications:   Prior to Admission medications   Medication Sig Start Date End Date Taking? Authorizing Provider  acetaminophen (TYLENOL) 325 MG tablet Take 2 tablets (650 mg total) by mouth every 6 (six) hours as needed for mild pain, fever or headache (or Fever >/= 101). 11/18/19   Roxan Hockey, MD  albuterol (PROVENTIL) (2.5 MG/3ML) 0.083% nebulizer solution Take 2.5 mg by nebulization in the morning and at bedtime.     [provider]  allopurinol (ZYLOPRIM) 100 MG tablet Take 1 tablet by mouth daily. For gout    [provider]  ALPRAZolam (XANAX) 0.5 MG tablet Take 0.5 mg by mouth at bedtime as needed for anxiety.    [provider]  amLODipine (NORVASC) 10 MG tablet Take 1 tablet (10 mg total) by mouth daily. 05/21/19   Roxan Hockey, MD  aspirin EC 81 MG tablet Take 1 tablet (81 mg total) by mouth daily with breakfast. 11/18/19   Emokpae, Courage, MD  BREO ELLIPTA 100-25 MCG/INH AEPB Inhale 2 puffs into the lungs daily.  09/27/16   [provider]  calcitRIOL (ROCALTROL) 0.25 MCG capsule Take 0.25 mcg by mouth daily.    [provider]  calcium carbonate (TUMS - DOSED IN MG ELEMENTAL  CALCIUM) 500 MG chewable tablet Chew 2 tablets by mouth 2 (two) times daily.    [provider]  cholecalciferol (VITAMIN D) 1000 units tablet Take 5,000 Units by mouth daily.     [provider]  cyanocobalamin 1000  MCG tablet Take 1,000 mcg by mouth daily.    [provider]  epoetin alfa (EPOGEN) 3000 UNIT/ML injection Inject 3,000 Units into the vein every 14 (fourteen) days. 03/02/20   Bhutani, Lavina Hamman, MD  fish oil-omega-3 fatty acids 1000 MG capsule Take 1 g by mouth 2 (two) times daily.    [provider]  FLUoxetine (PROZAC) 20 MG capsule Take 20 mg by mouth daily. 05/12/19   [provider]  gabapentin (NEURONTIN) 300 MG capsule Take 1 capsule (300 mg total) by mouth at bedtime. 12/21/19   Barton Dubois, MD  HYDROcodone-acetaminophen (NORCO/VICODIN) 5-325 MG tablet Take 1 tablet by mouth every 12 (twelve) hours as needed for moderate pain or severe pain. Patient taking differently: Take 1 tablet by mouth every 6 (six) hours as needed for moderate pain or severe pain.  11/18/19   Roxan Hockey, MD  insulin aspart (NOVOLOG) 100 UNIT/ML injection Inject 2-14 Units into the skin 3 (three) times daily before meals. Sliding scale 150-200=2units, 201-250=4units, 251-300=6units, 301-349=8units, 350-400=10 units, 401-450=12units, 451-500=14units before meals and at betime    [provider]  lactulose (CHRONULAC) 10 GM/15ML solution Take 15 mLs (10 g total) by mouth See admin instructions. --Please give 68m [10 g total] every Monday and Friday due to tendency for high ammonia 11/18/19   ERoxan Hockey MD  magnesium oxide (MAG-OX) 400 MG tablet Take 400 mg by mouth daily.    [provider]  metoprolol succinate (TOPROL-XL) 50 MG 24 hr tablet Take 50 mg by mouth in the morning and at bedtime. Take with or immediately following a meal.    [provider]  Multiple Vitamin (MULTIVITAMIN) tablet Take 1 tablet by mouth daily. For  wound healing    [provider]  pantoprazole (PROTONIX) 40 MG tablet Take 1 tablet (40 mg total) by mouth daily before breakfast. 05/21/19 12/18/19  ERoxan Hockey MD  polyethylene glycol (MIRALAX / GLYCOLAX) 17 g packet Take 17 g by mouth daily. For constipation    [provider]  tamsulosin (FLOMAX) 0.4 MG CAPS capsule Take 1 capsule (0.4 mg total) by mouth daily after supper. 11/18/19   ERoxan Hockey MD  torsemide (DEMADEX) 20 MG tablet Take 2 tablets (40 mg total) by mouth 2 (two) times daily. 12/21/19   MBarton Dubois MD     Allergies:     Allergies  Allergen Reactions  . Blueberry Flavor Other (See Comments)    Unknown  . Cucumber Extract Other (See Comments)    "Fells like I'm having a heart attack"  . Flexeril [Cyclobenzaprine Hcl] Other (See Comments)    "whole body  Tremors" (05/27/2012)  . Kiwi Extract Other (See Comments)    "feels like I'm having a heart attack"     Physical Exam:   Vitals  Blood pressure 111/61, pulse 75, temperature 98.6 F (37 C), temperature source Oral, resp. rate (!) 23, height 6' (1.829 m), weight 130.2 kg, SpO2 100 %.   1. General obese male, ill-appearing, laying in bed, with significant tremors(reported this is his baseline). uncomfortable .    2.  SHEENT is awake, alert, he is confused, but answering most questions appropriately  3. No F.N deficits, ALL C.Nerves Intact, Strength 5/5 all 4 extremities, Sensation intact all 4 extremities, Plantars down going.  4. Ears and Eyes appear Normal, PERRLA, disks sclera  5. Supple Neck, No JVD, No cervical lymphadenopathy appriciated, No Carotid Bruits.  6. Symmetrical Chest wall movement, some diminished air entry, with diffuse  wheezing  7.  Cardiac, No Gallops, Rubs or Murmurs, No Parasternal Heave.  8. Positive Bowel Sounds, diffusely tender, but mainly in the right upper quadrant .  9.  No Cyanosis, patient is jaundiced, with chronic lower extremity skin  changes  10. Good muscle tone,  joints appear normal , no effusions, Normal ROM.  11. No Palpable Lymph Nodes in Neck or Axillae    Data Review:    CBC Recent Labs  Lab 05/10/20 1855 05/11/20 1621  WBC 16.3* 7.8  HGB 10.6* 9.8*  HCT 33.9* 31.3*  PLT 218 184  MCV 93.6 92.9  MCH 29.3 29.1  MCHC 31.3 31.3  RDW 15.9* 16.1*  LYMPHSABS 0.4* 0.4*  MONOABS 0.6 0.4  EOSABS 0.0 0.0  BASOSABS 0.0 0.0   ------------------------------------------------------------------------------------------------------------------  Chemistries  Recent Labs  Lab 05/10/20 1855 05/11/20 1621  NA 139 138  K 3.2* 2.9*  CL 98 98  CO2 28 27  GLUCOSE 192* 186*  BUN 44* 45*  CREATININE 3.61* 3.73*  CALCIUM 7.4* 7.4*  AST 54* 44*  ALT 59* 48*  ALKPHOS 163* 155*  BILITOT 3.2* 3.9*   ------------------------------------------------------------------------------------------------------------------ estimated creatinine clearance is 26.1 mL/min (A) (by C-G formula based on SCr of 3.73 mg/dL (H)). ------------------------------------------------------------------------------------------------------------------ No results for input(s): TSH, T4TOTAL, T3FREE, THYROIDAB in the last 72 hours.  Invalid input(s): FREET3  Coagulation profile Recent Labs  Lab 05/10/20 1855 05/11/20 1621  INR 1.3* 1.3*   ------------------------------------------------------------------------------------------------------------------- No results for input(s): DDIMER in the last 72 hours. -------------------------------------------------------------------------------------------------------------------  Cardiac Enzymes No results for input(s): CKMB, TROPONINI, MYOGLOBIN in the last 168 hours.  Invalid input(s): CK ------------------------------------------------------------------------------------------------------------------    Component Value Date/Time   BNP 295.0 (H) 12/18/2019 1200      ---------------------------------------------------------------------------------------------------------------  Urinalysis    Component Value Date/Time   COLORURINE YELLOW 12/10/2016 1041   APPEARANCEUR HAZY (A) 12/10/2016 1041   LABSPEC 1.016 12/10/2016 1041   PHURINE 5.0 12/10/2016 1041   GLUCOSEU NEGATIVE 12/10/2016 1041   HGBUR LARGE (A) 12/10/2016 1041   BILIRUBINUR NEGATIVE 12/10/2016 1041   KETONESUR 5 (A) 12/10/2016 1041   PROTEINUR 100 (A) 12/10/2016 1041   UROBILINOGEN 0.2 03/04/2015 1835   NITRITE NEGATIVE 12/10/2016 1041   LEUKOCYTESUR TRACE (A) 12/10/2016 1041    ----------------------------------------------------------------------------------------------------------------   Imaging Results:    DG Chest Port 1 View  Result Date: 05/11/2020 CLINICAL DATA:  Shortness of breath x2 days. EXAM: PORTABLE CHEST 1 VIEW COMPARISON:  May 10, 2020 FINDINGS: Mild, stable diffuse chronic appearing increased interstitial lung markings are seen. There is no evidence of acute infiltrate, pleural effusion or pneumothorax. The cardiac silhouette is markedly enlarged and unchanged in size. There is moderate severity calcification of the aortic arch. Degenerative changes are seen throughout the thoracic spine. IMPRESSION: Stable cardiomegaly without evidence of acute or active cardiopulmonary disease. Electronically Signed   By: Virgina Norfolk M.D.   On: 05/11/2020 17:40   DG Chest Port 1 View  Result Date: 05/10/2020 CLINICAL DATA:  Shortness of breath. EXAM: PORTABLE CHEST 1 VIEW COMPARISON:  01/26/2020 FINDINGS: Again noted is a rounded pulmonary nodule in the right upper lung zone. There is no pneumothorax. No large pleural effusion. The heart size remains enlarged. The trachea is midline. The lung volumes are slightly low. There is no acute osseous abnormality. Aortic calcifications are again noted. IMPRESSION: 1. No acute cardiopulmonary process. 2. Persistent rounded  pulmonary nodule in the right upper lobe. This was present on the patient's CT from 2016 and is almost  certainly a benign process. 3. Cardiomegaly with low lung volumes. Electronically Signed   By: Constance Holster M.D.   On: 05/10/2020 19:54    My personal review of EKG: Rhythm undetermined rhythm, patient with significant tremors at baseline, heart rate 127 QTc of 419  Undetermined rhythm Nonspecific intraventricular conduction delay Artifact When compared with ECG of 12/20/2019, No significant change was found Confirmed by Delora Fuel ([id number]) on 12/   Assessment & Plan:    Active Problems:   Anxiety   Arterial insufficiency of lower extremity ([name])   Bacteremia  Bacteremia -Rods bacteremia, most likely source is has a bladder, high clinical suspicion for cholangitis, given fever, leukocytosis, elevated LFTs and right upper quadrant tenderness, unfortunately ultrasound cannot be done at this time with a day, will proceed with CT abdomen without contrast and MRCP if available to evaluate for cholangitis, if positive will discuss with GI and general surgery. -Obtain urinalysis as well -We will keep empiric antibiotic coverage for gram-negative with cefepime. -Continue with IV fluids.  Transaminitis -O with hyperbilirubinemia, will obtain direct bilirubin, patient with known history of cholelithiasis, high clinical suspicion for cholangitis, Juliane I would start with right upper quadrant ultrasound, but unfortunately it is unavailable after 5 PM at current facility as discussed with technician on call, so well obtain MRCP, CT abdomen pelvis, keep n.p.o., empiric antibiotic coverage, IV fluids. -Repeat LFTs in a.m..  COPD exacerbation -With significant wheezing, will start on steroids, DuoNeb, continue with home Breo Ellipta.  CHRONIC hypoxic respiratory failure -He is currently on 3 L nasal cannula, he is on sublethal patient  Type 2 diabetes mellitus -Continue with insulin  sliding scale.  CKD stage 4 -At baseline, continue with IV fluids, avoid nephrotoxic medications  Hypertension -Continue metoprolol, hold rest of antihypertensive medications.  type 2 diabetes with polyneuropathy -Continue Neurontin  gastroesophageal reflux disease/GI prophylaxis -Continue PPI.  depression/anxiety -Continue with home medications  HLD -Hold statins due to abnormal liver enzymes  hx of gout -continue allopurinol   Hypokalemia -Repleted  Anemia due to chronic renal insufficiency -At baseline,  DVT Prophylaxis Heparin  AM Labs Ordered, also please review Full Orders  Family Communication: Admission, patients condition and plan of care including tests being ordered have been discussed with the patient and son who indicate understanding and agree with the plan and Code Status.  Code Status Full  Likely DC to  SNF  Condition GUARDED    Consults called: none yet, awaiting ct abd and pelvis, likely will need GI and general surgery involved if he has cholangitis  Admission status: inpatient    Time spent in minutes : 65 minutes   Phillips Climes M.D on 05/11/2020 at [place]  [phone]

## 2020-05-12 ENCOUNTER — Inpatient Hospital Stay (HOSPITAL_COMMUNITY): Payer: Medicare (Managed Care)

## 2020-05-12 ENCOUNTER — Encounter (HOSPITAL_COMMUNITY): Payer: Self-pay | Admitting: Internal Medicine

## 2020-05-12 DIAGNOSIS — N184 Chronic kidney disease, stage 4 (severe): Secondary | ICD-10-CM

## 2020-05-12 DIAGNOSIS — R1011 Right upper quadrant pain: Secondary | ICD-10-CM | POA: Insufficient documentation

## 2020-05-12 DIAGNOSIS — R748 Abnormal levels of other serum enzymes: Secondary | ICD-10-CM | POA: Diagnosis present

## 2020-05-12 DIAGNOSIS — K8020 Calculus of gallbladder without cholecystitis without obstruction: Secondary | ICD-10-CM | POA: Diagnosis present

## 2020-05-12 DIAGNOSIS — E1122 Type 2 diabetes mellitus with diabetic chronic kidney disease: Secondary | ICD-10-CM

## 2020-05-12 DIAGNOSIS — J9611 Chronic respiratory failure with hypoxia: Secondary | ICD-10-CM

## 2020-05-12 DIAGNOSIS — Z794 Long term (current) use of insulin: Secondary | ICD-10-CM

## 2020-05-12 LAB — CBC
HCT: 30.1 % — ABNORMAL LOW (ref 39.0–52.0)
Hemoglobin: 9.2 g/dL — ABNORMAL LOW (ref 13.0–17.0)
MCH: 28.9 pg (ref 26.0–34.0)
MCHC: 30.6 g/dL (ref 30.0–36.0)
MCV: 94.7 fL (ref 80.0–100.0)
Platelets: 151 10*3/uL (ref 150–400)
RBC: 3.18 MIL/uL — ABNORMAL LOW (ref 4.22–5.81)
RDW: 16.1 % — ABNORMAL HIGH (ref 11.5–15.5)
WBC: 7 10*3/uL (ref 4.0–10.5)
nRBC: 0 % (ref 0.0–0.2)

## 2020-05-12 LAB — BASIC METABOLIC PANEL
Anion gap: 15 (ref 5–15)
BUN: 50 mg/dL — ABNORMAL HIGH (ref 8–23)
CO2: 24 mmol/L (ref 22–32)
Calcium: 7.2 mg/dL — ABNORMAL LOW (ref 8.9–10.3)
Chloride: 96 mmol/L — ABNORMAL LOW (ref 98–111)
Creatinine, Ser: 3.9 mg/dL — ABNORMAL HIGH (ref 0.61–1.24)
GFR, Estimated: 16 mL/min — ABNORMAL LOW (ref >60)
Glucose, Bld: 267 mg/dL — ABNORMAL HIGH (ref 70–99)
Potassium: 4 mmol/L (ref 3.5–5.1)
Sodium: 135 mmol/L (ref 135–145)

## 2020-05-12 LAB — HEPATIC FUNCTION PANEL
ALT: 46 U/L — ABNORMAL HIGH (ref 0–44)
AST: 37 U/L (ref 15–41)
Albumin: 2.4 g/dL — ABNORMAL LOW (ref 3.5–5.0)
Alkaline Phosphatase: 150 U/L — ABNORMAL HIGH (ref 38–126)
Bilirubin, Direct: 2.7 mg/dL — ABNORMAL HIGH (ref 0.0–0.2)
Indirect Bilirubin: 1.4 mg/dL — ABNORMAL HIGH (ref 0.3–0.9)
Total Bilirubin: 4.1 mg/dL — ABNORMAL HIGH (ref 0.3–1.2)
Total Protein: 6.5 g/dL (ref 6.5–8.1)

## 2020-05-12 LAB — PROCALCITONIN: Procalcitonin: 29.13 ng/mL

## 2020-05-12 LAB — GLUCOSE, CAPILLARY: Glucose-Capillary: 372 mg/dL — ABNORMAL HIGH (ref 70–99)

## 2020-05-12 LAB — MRSA PCR SCREENING: MRSA by PCR: NEGATIVE

## 2020-05-12 MED ORDER — OMEGA-3-ACID ETHYL ESTERS 1 G PO CAPS
1.0000 g | ORAL_CAPSULE | Freq: Two times a day (BID) | ORAL | Status: DC
  Administered 2020-05-12 – 2020-05-14 (×5): 1 g via ORAL
  Filled 2020-05-12 (×7): qty 1

## 2020-05-12 MED ORDER — IPRATROPIUM-ALBUTEROL 0.5-2.5 (3) MG/3ML IN SOLN
3.0000 mL | Freq: Two times a day (BID) | RESPIRATORY_TRACT | Status: DC
  Administered 2020-05-12 – 2020-05-14 (×4): 3 mL via RESPIRATORY_TRACT
  Filled 2020-05-12 (×5): qty 3

## 2020-05-12 MED ORDER — CHLORHEXIDINE GLUCONATE CLOTH 2 % EX PADS
6.0000 | MEDICATED_PAD | Freq: Every day | CUTANEOUS | Status: DC
  Administered 2020-05-12 – 2020-05-14 (×3): 6 via TOPICAL

## 2020-05-12 MED ORDER — INSULIN ASPART 100 UNIT/ML ~~LOC~~ SOLN
0.0000 [IU] | Freq: Every day | SUBCUTANEOUS | Status: DC
  Administered 2020-05-12: 5 [IU] via SUBCUTANEOUS
  Administered 2020-05-13: 2 [IU] via SUBCUTANEOUS

## 2020-05-12 MED ORDER — INSULIN ASPART 100 UNIT/ML ~~LOC~~ SOLN
0.0000 [IU] | Freq: Three times a day (TID) | SUBCUTANEOUS | Status: DC
  Administered 2020-05-13 (×2): 7 [IU] via SUBCUTANEOUS
  Administered 2020-05-13: 5 [IU] via SUBCUTANEOUS
  Administered 2020-05-14: 17:00:00 2 [IU] via SUBCUTANEOUS
  Administered 2020-05-14: 09:00:00 1 [IU] via SUBCUTANEOUS
  Administered 2020-05-14: 12:00:00 2 [IU] via SUBCUTANEOUS

## 2020-05-12 MED ORDER — AMLODIPINE BESYLATE 5 MG PO TABS
10.0000 mg | ORAL_TABLET | Freq: Every day | ORAL | Status: DC
  Administered 2020-05-12 – 2020-05-14 (×3): 10 mg via ORAL
  Filled 2020-05-12 (×3): qty 2

## 2020-05-12 MED ORDER — INSULIN GLARGINE 100 UNIT/ML ~~LOC~~ SOLN
30.0000 [IU] | Freq: Every day | SUBCUTANEOUS | Status: DC
  Administered 2020-05-12: 30 [IU] via SUBCUTANEOUS
  Filled 2020-05-12 (×4): qty 0.3

## 2020-05-12 MED ORDER — SODIUM CHLORIDE 0.9 % IV SOLN
2.0000 g | INTRAVENOUS | Status: DC
  Administered 2020-05-12 – 2020-05-14 (×3): 2 g via INTRAVENOUS
  Filled 2020-05-12 (×3): qty 20

## 2020-05-12 MED ORDER — HYDROMORPHONE HCL 1 MG/ML IJ SOLN
0.5000 mg | INTRAMUSCULAR | Status: DC | PRN
  Administered 2020-05-12 – 2020-05-14 (×7): 0.5 mg via INTRAVENOUS
  Filled 2020-05-12 (×5): qty 0.5
  Filled 2020-05-12: qty 1
  Filled 2020-05-12: qty 0.5

## 2020-05-12 MED ORDER — METOPROLOL TARTRATE 50 MG PO TABS
50.0000 mg | ORAL_TABLET | Freq: Two times a day (BID) | ORAL | Status: DC
  Administered 2020-05-12 – 2020-05-14 (×5): 50 mg via ORAL
  Filled 2020-05-12 (×5): qty 1

## 2020-05-12 NOTE — Consult Note (Addendum)
Referring Provider: Kathie Dike, MD Primary Care Physician:  [name], MD Primary Gastroenterologist:  Elon Alas. Abbey Chatters, DO (Previously Dr. Oneida Alar)  Reason for Consultation:  Elevated LFTs, ?cholangitis  HPI: Eric Scott is a 69 y.o. male with history of type 2 diabetes mellitus, stroke, peripheral vascular disease, hypertension, DVT, diastolic CHF, COPD, chronic kidney disease presented from [facility] skilled nursing facility for shortness of breath, low-grade fever 2 days ago.  Chest x-ray showed no evidence of pneumonia. Blood culture performed. Ultimately was discharged back to the nursing home.  Blood cultures returned positive, patient transported back to the ED for evaluation.    In the ED patient reported some right upper quadrant abdominal pain.  Also complained of some shortness of breath worse than baseline.  Repeat chest x-ray with no acute changes.  No evidence of infiltrate.  Labs from 2 days ago with total bilirubin of 3.2, alkaline phosphatase 163, AST 54, ALT 59, BUN 44, creatinine 3.61 (at baseline).  White blood cell count 16,300, hemoglobin 10.6.  Yesterday when he presented back to the ED, lactic acid 0.9, total bilirubin 3.9, alkaline phosphatase 155, AST 44, ALT 48, BUN 45, creatinine 3.73, potassium 2.9, hemoglobin 9.8, white blood cell count 7800, direct bilirubin 2.7.  SARS coronavirus 2 neg, urinalysis with moderate leukocytes, few bacteria, negative nitrite with culture pending.  Today's total bilirubin is 4.1, direct bilirubin 2.7, alk phos 150, AST 37, ALT 46, white blood cell count 7000, hemoglobin 9.2, platelets 151,000, BUN 50, creatinine 3.9.  MRI abdomen MRCP without contrast completed.  Common bile duct 4 mm.  No filling defect.  Cholelithiasis with large gallstones measuring up to 2.4 cm.  Gallbladder moderately distended but no definitive gallbladder wall thickness or pericholecystic fluid.  Edematous hyperplasia of the adrenal glands bilaterally.  CT  abdomen pelvis without contrast showed hepatic steatosis, cholelithiasis without definitive findings to suggest acute cholecystitis, new intermediate attenuation small amount of pericardial fluid and/or thickening.  Multiple prominent borderline enlarged and mildly enlarged upper abdominal lymph nodes measuring up to 1.7 cm, nonspecific, likely benign.  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  Blood cultures positive for Enterobacterales and Klebsiella pneumonia A.  Patient complains of right upper quadrant pain for 2 days.  States he had the pain his first ED visit.  Pain comes and goes.  Currently 8 out of 10 on a pain scale.  Denies any nausea and vomiting.  Bowel movements have been regular.  He is not aware of any blood in the stool or melena but he does not assist with his cleanup.  Denies any dysuria.  No heartburn.  Feels more short of breath than usual.  Tremors are worse than normal.    Prior to Admission medications   Medication Sig Start Date End Date Taking? Authorizing Provider  acetaminophen (TYLENOL) 325 MG tablet Take 2 tablets (650 mg total) by mouth every 6 (six) hours as needed for mild pain, fever or headache (or Fever >/= 101). 11/18/19  Yes Emokpae, Courage, MD  albuterol (PROVENTIL) (2.5 MG/3ML) 0.083% nebulizer solution Take 2.5 mg by nebulization in the morning and at bedtime.    Yes [provider]  allopurinol (ZYLOPRIM) 100 MG tablet Take 1 tablet by mouth daily. For gout   Yes [provider]  ALPRAZolam (XANAX) 0.5 MG tablet Take 0.5 mg by mouth at bedtime as needed for anxiety.   Yes [provider]  amLODipine (NORVASC) 10 MG tablet Take 1 tablet (10 mg total) by mouth  daily. 05/21/19  Yes Roxan Hockey, MD  aspirin EC 81 MG tablet Take 1 tablet (81 mg total) by mouth daily with breakfast. 11/18/19  Yes Emokpae, Courage, MD  BREO ELLIPTA 100-25 MCG/INH AEPB Inhale 1 puff into the lungs daily.  09/27/16  Yes [provider]  calcitRIOL (ROCALTROL) 0.25 MCG capsule Take 0.25 mcg by mouth daily.   Yes [provider]  calcium carbonate (TUMS - DOSED IN MG ELEMENTAL CALCIUM) 500 MG chewable tablet Chew 2 tablets by mouth 2 (two) times daily.   Yes [provider]  cholecalciferol (VITAMIN D) 1000 units tablet Take 5,000 Units by mouth daily.    Yes [provider]  cyanocobalamin 1000 MCG tablet Take 1,000 mcg by mouth daily.   Yes [provider]  fish oil-omega-3 fatty acids 1000 MG capsule Take 1 g by mouth 2 (two) times daily.   Yes [provider]  FLUoxetine (PROZAC) 20 MG capsule Take 20 mg by mouth daily. 05/12/19  Yes [provider]  gabapentin (NEURONTIN) 300 MG capsule Take 1 capsule (300 mg total) by mouth at bedtime. 12/21/19  Yes Barton Dubois, MD  GUAIFENESIN PO Take 600 mg by mouth.   Yes [provider]  HYDROcodone-acetaminophen (NORCO/VICODIN) 5-325 MG tablet Take 1 tablet by mouth every 12 (twelve) hours as needed for moderate pain or severe pain. Patient taking differently: Take 1 tablet by mouth every 6 (six) hours as needed for moderate pain or severe pain.  11/18/19  Yes Emokpae, Courage, MD  insulin aspart (NOVOLOG) 100 UNIT/ML injection Inject 2-14 Units into the skin 3 (three) times daily before meals. Sliding scale 150-200=2units, 201-250=4units, 251-300=6units, 301-349=8units, 350-400=10 units, 401-450=12units, 451-500=14units before meals and at betime   Yes [provider]  LANTUS 100 UNIT/ML injection Inject 32 Units into the skin at bedtime.  03/26/20  Yes [provider]  Lidocaine 4 % PTCH Apply 1 patch topically daily.   Yes [provider]  magnesium oxide (MAG-OX) 400 MG tablet Take 400 mg by mouth daily.   Yes [provider]  metoprolol tartrate (LOPRESSOR) 50 MG tablet Take 50 mg by mouth 2 (two) times daily. 04/27/20  Yes [provider]  Multiple Vitamin  (MULTIVITAMIN) tablet Take 1 tablet by mouth daily. For wound healing   Yes [provider]  ondansetron (ZOFRAN) 4 MG tablet Take 4 mg by mouth every 8 (eight) hours as needed. 04/25/20  Yes [provider]  pantoprazole (PROTONIX) 40 MG tablet Take 1 tablet (40 mg total) by mouth daily before breakfast. 05/21/19 05/11/20 Yes Emokpae, Courage, MD  polyethylene glycol (MIRALAX / GLYCOLAX) 17 g packet Take 17 g by mouth daily. For constipation   Yes [provider]  senna (SENOKOT) 8.6 MG tablet Take by mouth.   Yes [provider]  tamsulosin (FLOMAX) 0.4 MG CAPS capsule Take 1 capsule (0.4 mg total) by mouth daily after supper. 11/18/19  Yes Emokpae, Courage, MD  torsemide (DEMADEX) 20 MG tablet Take 2 tablets (40 mg total) by mouth 2 (two) times daily. Patient taking differently: Take 60 mg by mouth 2 (two) times daily.  12/21/19  Yes Barton Dubois, MD  epoetin alfa (EPOGEN) 3000 UNIT/ML injection Inject 3,000 Units into the vein every 14 (fourteen) days. 03/02/20   Bhutani, Lavina Hamman, MD  lactulose (CHRONULAC) 10 GM/15ML solution Take 15 mLs (10 g total) by mouth See admin instructions. --Please give 61m [10 g total] every Monday and Friday due to tendency for  high ammonia 11/18/19   Roxan Hockey, MD    Current Facility-Administered Medications  Medication Dose Route Frequency Provider Last Rate Last Admin  . acetaminophen (TYLENOL) tablet 650 mg  650 mg Oral Q6H PRN Elgergawy, Silver Huguenin, MD       Or  . acetaminophen (TYLENOL) suppository 650 mg  650 mg Rectal Q6H PRN Elgergawy, Dawood S, MD      . albuterol (PROVENTIL) (2.5 MG/3ML) 0.083% nebulizer solution 2.5 mg  2.5 mg Nebulization Q2H PRN Elgergawy, Silver Huguenin, MD      . allopurinol (ZYLOPRIM) tablet 100 mg  100 mg Oral Daily Elgergawy, Silver Huguenin, MD      . ALPRAZolam Duanne Moron) tablet 0.5 mg  0.5 mg Oral QHS PRN Elgergawy, Silver Huguenin, MD      . aspirin EC tablet 81 mg  81 mg Oral Q breakfast Elgergawy, Silver Huguenin, MD      . calcitRIOL (ROCALTROL) capsule 0.25 mcg  0.25 mcg Oral Daily Elgergawy, Silver Huguenin, MD      . ceFEPIme (MAXIPIME) 2 g in sodium chloride 0.9 % 100 mL IVPB  2 g Intravenous [id number] Delora Fuel, MD      . cholecalciferol (VITAMIN D3) tablet 5,000 Units  5,000 Units Oral Daily Elgergawy, Silver Huguenin, MD      . FLUoxetine (PROZAC) capsule 20 mg  20 mg Oral Daily Elgergawy, Dawood S, MD      . fluticasone furoate-vilanterol (BREO ELLIPTA) 100-25 MCG/INH 2 puff  2 puff Inhalation Daily Elgergawy, Dawood S, MD      . gabapentin (NEURONTIN) capsule 300 mg  300 mg Oral QHS Elgergawy, Silver Huguenin, MD   300 mg at 05/11/20 2132  . heparin injection 5,000 Units  5,000 Units Subcutaneous Q8H Elgergawy, Silver Huguenin, MD   5,000 Units at 05/12/20 0555  . ipratropium-albuterol (DUONEB) 0.5-2.5 (3) MG/3ML nebulizer solution 3 mL  3 mL Nebulization QID Elgergawy, Silver Huguenin, MD   3 mL at 05/11/20 2111  . lactated ringers infusion   Intravenous Continuous Elgergawy, Silver Huguenin, MD 75 mL/hr at 05/12/20 0423 Rate Verify at 05/12/20 0423  . methylPREDNISolone sodium succinate (SOLU-MEDROL) 40 mg/mL injection 40 mg  40 mg Intravenous Q8H Elgergawy, Silver Huguenin, MD   40 mg at 05/12/20 0555  . metoprolol tartrate (LOPRESSOR) tablet 50 mg  50 mg Oral BID Elgergawy, Silver Huguenin, MD      . metroNIDAZOLE (FLAGYL) tablet 500 mg  413 mg Oral K4M Delora Fuel, MD   010 mg at 05/12/20 0555  . multivitamin with minerals tablet 1 tablet  1 tablet Oral Daily Elgergawy, Dawood S, MD      . omega-3 acid ethyl esters (LOVAZA) capsule 1 g  1 g Oral BID Elgergawy, Silver Huguenin, MD      . pantoprazole (PROTONIX) EC tablet 40 mg  40 mg Oral QAC breakfast Elgergawy, Silver Huguenin, MD      . tamsulosin (FLOMAX) capsule 0.4 mg  0.4 mg Oral QPC supper Elgergawy, Silver Huguenin, MD      . Derrill Memo ON 05/13/2020] vancomycin (VANCOREADY) IVPB 1750 mg/350 mL  1,750 mg Intravenous [id number] Delora Fuel, MD      . vitamin B-12 (CYANOCOBALAMIN) tablet 1,000 mcg  1,000 mcg Oral Daily  Elgergawy, Silver Huguenin, MD       Current Outpatient Medications  Medication Sig Dispense Refill  . acetaminophen (TYLENOL) 325 MG tablet Take 2 tablets (650 mg total) by mouth every 6 (six) hours as needed for mild pain, fever or  headache (or Fever >/= 101). 12 tablet 0  . albuterol (PROVENTIL) (2.5 MG/3ML) 0.083% nebulizer solution Take 2.5 mg by nebulization in the morning and at bedtime.     Marland Kitchen allopurinol (ZYLOPRIM) 100 MG tablet Take 1 tablet by mouth daily. For gout    . ALPRAZolam (XANAX) 0.5 MG tablet Take 0.5 mg by mouth at bedtime as needed for anxiety.    Marland Kitchen amLODipine (NORVASC) 10 MG tablet Take 1 tablet (10 mg total) by mouth daily. 30 tablet 2  . aspirin EC 81 MG tablet Take 1 tablet (81 mg total) by mouth daily with breakfast. 30 tablet 11  . BREO ELLIPTA 100-25 MCG/INH AEPB Inhale 1 puff into the lungs daily.     . calcitRIOL (ROCALTROL) 0.25 MCG capsule Take 0.25 mcg by mouth daily.    . calcium carbonate (TUMS - DOSED IN MG ELEMENTAL CALCIUM) 500 MG chewable tablet Chew 2 tablets by mouth 2 (two) times daily.    . cholecalciferol (VITAMIN D) 1000 units tablet Take 5,000 Units by mouth daily.     . cyanocobalamin 1000 MCG tablet Take 1,000 mcg by mouth daily.    . fish oil-omega-3 fatty acids 1000 MG capsule Take 1 g by mouth 2 (two) times daily.    Marland Kitchen FLUoxetine (PROZAC) 20 MG capsule Take 20 mg by mouth daily.    Marland Kitchen gabapentin (NEURONTIN) 300 MG capsule Take 1 capsule (300 mg total) by mouth at bedtime. 30 capsule 1  . GUAIFENESIN PO Take 600 mg by mouth.    Marland Kitchen HYDROcodone-acetaminophen (NORCO/VICODIN) 5-325 MG tablet Take 1 tablet by mouth every 12 (twelve) hours as needed for moderate pain or severe pain. (Patient taking differently: Take 1 tablet by mouth every 6 (six) hours as needed for moderate pain or severe pain. ) 15 tablet 0  . insulin aspart (NOVOLOG) 100 UNIT/ML injection Inject 2-14 Units into the skin 3 (three) times daily before meals. Sliding scale 150-200=2units,  201-250=4units, 251-300=6units, 301-349=8units, 350-400=10 units, 401-450=12units, 451-500=14units before meals and at betime    . LANTUS 100 UNIT/ML injection Inject 32 Units into the skin at bedtime.     . Lidocaine 4 % PTCH Apply 1 patch topically daily.    . magnesium oxide (MAG-OX) 400 MG tablet Take 400 mg by mouth daily.    . metoprolol tartrate (LOPRESSOR) 50 MG tablet Take 50 mg by mouth 2 (two) times daily.    . Multiple Vitamin (MULTIVITAMIN) tablet Take 1 tablet by mouth daily. For wound healing    . ondansetron (ZOFRAN) 4 MG tablet Take 4 mg by mouth every 8 (eight) hours as needed.    . pantoprazole (PROTONIX) 40 MG tablet Take 1 tablet (40 mg total) by mouth daily before breakfast. 30 tablet 0  . polyethylene glycol (MIRALAX / GLYCOLAX) 17 g packet Take 17 g by mouth daily. For constipation    . senna (SENOKOT) 8.6 MG tablet Take by mouth.    . tamsulosin (FLOMAX) 0.4 MG CAPS capsule Take 1 capsule (0.4 mg total) by mouth daily after supper. 30 capsule 2  . torsemide (DEMADEX) 20 MG tablet Take 2 tablets (40 mg total) by mouth 2 (two) times daily. (Patient taking differently: Take 60 mg by mouth 2 (two) times daily. )    . epoetin alfa (EPOGEN) 3000 UNIT/ML injection Inject 3,000 Units into the vein every 14 (fourteen) days.    Marland Kitchen lactulose (CHRONULAC) 10 GM/15ML solution Take 15 mLs (10 g total) by mouth See admin instructions. --Please give  54m [10 g total] every Monday and Friday due to tendency for high ammonia 236 mL 1    Allergies as of 05/11/2020 - Review Complete 05/11/2020  Allergen Reaction Noted  . Blueberry flavor Other (See Comments) 05/29/2016  . Cucumber extract Other (See Comments) 08/01/2012  . Flexeril [cyclobenzaprine hcl] Other (See Comments) 07/20/2010  . Kiwi extract Other (See Comments) 08/01/2012    Past Medical History:  Diagnosis Date  . Anemia   . Anxiety   . Chronic pain    legs, back; MRI 05/2012 with mild thoracic degenerative changes no  spinal stenosis   . 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 COPD (chronic obstructive pulmonary disease) ([facility]   . COVID-19   . Depression   . Diabetic peripheral neuropathy ([facility]    "chronic" (05/27/2012)  . Diastolic CHF ([facility]   . Duodenal ulcer hemorrhage   . DVT (deep venous thrombosis) ([name])    right upper arm  . GERD (gastroesophageal reflux disease)   . Hypercholesteremia   . Hypertension   . Iron deficiency anemia   . Osteomyelitis of ankle ([facility]   . Peripheral edema   . PVD (peripheral vascular disease) ([facility]   . Renal disorder   . Spinal stenosis    mild lumbar (MRI 05/2012)-L2-L3 to L4-L5 , mild lumbar foraminal stenosis   . Stroke ([facility] 05/2012   Subacute, lacunar infarcts within the left basal ganglia and posterior limp of the left internal capsule/thalamus; "RUE; both feet weak" (05/27/2012)  . Tremor   . Type II diabetes mellitus ([facility]     Past Surgical History:  Procedure Laterality Date  . ANKLE SURGERY    . ESOPHAGOGASTRODUODENOSCOPY (EGD) WITH PROPOFOL N/A 12/21/2016   [place]k: Ulcerative reflux esophagitis, erosive gastropathy, extensive duodenal ulceration likely site of bleeding.  Pathology with mild gastritis, no H. pylori  . ESOPHAGOGASTRODUODENOSCOPY (EGD) WITH PROPOFOL N/A 04/02/2017   Fields: ESOPAHGITIS/GASTRIC AND DUODENAL ULCERS HEALED. mild duodenitis  . HERNIA REPAIR  01/05/2004   "belly button" (05/27/2012)    Family History  Problem Relation Age of Onset  . Colon cancer Neg Hx     Social History   Socioeconomic History  . Marital status: Legally Separated    Spouse name: Not on file  . Number of children: Not on file  . Years of education: Not on file  . Highest education level: Not on file  Occupational History  . Not on file  Tobacco Use  . Smoking status: Former Smoker    Packs/day: 2.00    Years: 54.00    Pack years: 108.00    Types: Cigarettes    Quit date: 12/12/2019    Years since quitting: 0.4  . Smokeless  tobacco: Never Used  Vaping Use  . Vaping Use: Never used  Substance and Sexual Activity  . Alcohol use: No    Comment: 05/27/2012 "drank gallons and gallons 20 yr ago or so; last drink  at least 10 yr ago"  . Drug use: No  . Sexual activity: Not Currently  Other Topics Concern  . Not on file  Social History Narrative   [place] [facility] [place]    He is divorced. Has one kid.   Not working for about 7 years now. Used to do plumbing work.   Social Determinants of Health   Financial Resource Strain: Not on file  Food Insecurity: Not on file  Transportation Needs: Not on file  Physical  Activity: Not on file  Stress: Not on file  Social Connections: Not on file  Intimate Partner Violence: Not on file     ROS:  General: Negative for anorexia, weight loss,   fatigue, weakness. Eyes: Negative for vision changes.  Recent grade temp,none 24 hours. ENT: Negative for hoarseness, difficulty swallowing , nasal congestion. CV: Negative for chest pain, angina, palpitations, dyspnea on exertion, positive peripheral edema.  Respiratory: Negative for  dyspnea on exertion, cough, sputum, wheezing.  Positive shortness of breath.  Patient is bedbound. GI: See history of present illness. GU:  Negative for dysuria, hematuria, urinary incontinence, urinary frequency, nocturnal urination.  MS: Negative for joint pain, low back pain.  Derm: Negative for rash or itching.  Neuro: Negative for  abnormal sensation, seizure, frequent headaches, memory loss, confusion.  Positive weakness, chronic tremors Psych: Negative for anxiety, depression, suicidal ideation, hallucinations.  Endo: Negative for unusual weight change.  Heme: Negative for bruising or bleeding. Allergy: Negative for rash or hives.       Physical Examination: Vital signs in last 24 hours: Temp:  [98.6 F (37 C)] 98.6 F (37 C) (12/08 1524) Pulse Rate:  [42-125] 58 (12/09 0730) Resp:  [11-34] 17 (12/09  0730) BP: (111-158)/(56-93) 154/87 (12/09 0730) SpO2:  [93 %-100 %] 95 % (12/09 0730) Weight:  [130.2 kg] 130.2 kg (12/08 1522)    General: Morbidly obese elderly Caucasian male no acute distress.    Head: Normocephalic, atraumatic.   Eyes: Conjunctiva pink, no icterus. Mouth: Oropharyngeal mucosa moist and pink , no lesions erythema or exudate. Neck: Supple without thyromegaly, masses, or lymphadenopathy.  Lungs: Clear to auscultation bilaterally.  Heart: Regular rate and rhythm, no murmurs rubs or gallops.  Abdomen: Bowel sounds are normal, nondistended, no hepatosplenomegaly or masses, no abdominal bruits or    hernia , no rebound or guarding.  Moderate right upper quadrant tenderness, exam limited due to body habitus. Rectal: Not performed Extremities: 2-3+ pitting edema lower extremity edema, clubbing, deformity.  Neuro: Alert and oriented x 4 , grossly normal neurologically.  Skin: Warm and dry, no rash or jaundice.  Sores noted anteriorly on lower extremities Psych: Alert and cooperative, normal mood and affect.        Intake/Output from previous day: 12/08 0701 - 12/09 0700 In: 897.1 [IV Piggyback:897.1] Out: -  Intake/Output this shift: No intake/output data recorded.  Lab Results: CBC Recent Labs    05/10/20 1855 05/11/20 1621 05/12/20 0252  WBC 16.3* 7.8 7.0  HGB 10.6* 9.8* 9.2*  HCT 33.9* 31.3* 30.1*  MCV 93.6 92.9 94.7  PLT 218 184 151   BMET Recent Labs    05/10/20 1855 05/11/20 1621 05/12/20 0252  NA 139 138 135  K 3.2* 2.9* 4.0  CL 98 98 96*  CO2 28 27 24   GLUCOSE 192* 186* 267*  BUN 44* 45* 50*  CREATININE 3.61* 3.73* 3.90*  CALCIUM 7.4* 7.4* 7.2*   LFT Recent Labs    05/10/20 1855 05/11/20 1621 05/11/20 1815 05/12/20 0252  BILITOT 3.2* 3.9*  --  4.1*  BILIDIR  --   --  2.7* 2.7*  IBILI  --   --   --  1.4*  ALKPHOS 163* 155*  --  150*  AST 54* 44*  --  37  ALT 59* 48*  --  46*  PROT 7.3 6.8  --  6.5  ALBUMIN 2.7* 2.5*  --  2.4*     Lipase No results for input(s): LIPASE in the last  72 hours.  PT/INR Recent Labs    05/10/20 1855 05/11/20 1621  LABPROT 15.4* 15.3*  INR 1.3* 1.3*      Imaging Studies: CT ABDOMEN PELVIS WO CONTRAST  Result Date: 05/11/2020 CLINICAL DATA:  69 year old male with history of elevated liver function tests. Cholelithiasis. Generalized weakness, fatigue and diffuse abdominal pain, most severe on the right side for the past 2 days. EXAM: CT ABDOMEN AND PELVIS WITHOUT CONTRAST TECHNIQUE: Multidetector CT imaging of the abdomen and pelvis was performed following the standard protocol without IV contrast. COMPARISON:  CT the abdomen and pelvis 11/23/2016. FINDINGS: Lower chest: Atherosclerotic calcifications in the descending thoracic aorta as well as the right coronary artery. Small amount of pericardial fluid and/or thickening, wound with intermediate attenuation fluid. No definite pericardial calcification. Hepatobiliary: Diffuse low attenuation, indicative throughout the of hepatic steatosis. Hepatic parenchyma multiple faintly peripherally calcified gallstones measuring up to 2.7 cm in the gallbladder. Gallbladder does not appear overly distended. No pericholecystic fluid or inflammatory changes. Pancreas: No definite pancreatic mass or peripancreatic fluid collections or inflammatory changes are noted on today's noncontrast CT examination. Spleen: Unremarkable. Adrenals/Urinary Tract: Low-attenuation adrenal form thickening in the adrenal glands bilaterally, likely to represent adenomatous hyperplasia. Multiple small renal lesions bilaterally, incompletely characterized on today's noncontrast CT but ranging from low to intermediate attenuation. No hydroureteronephrosis. Urinary bladder is unremarkable in appearance. Stomach/Bowel: Normal appearance of the stomach. No pathologic dilatation of small bowel or colon. Numerous colonic diverticulae are noted, without surrounding inflammatory changes to  suggest an acute diverticulitis at this time. Normal appendix. Vascular/Lymphatic: Aortic atherosclerosis. Multiple prominent borderline enlarged and mildly enlarged upper abdominal lymph nodes are noted, measuring up to 1.7 cm in short axis in the portacaval nodal station. Reproductive: Prostate gland and seminal vesicles are unremarkable in appearance. Other: No significant volume of ascites.  No pneumoperitoneum. Musculoskeletal: There are no aggressive appearing lytic or blastic lesions noted in the visualized portions of the skeleton. IMPRESSION: 1. Cholelithiasis without definitive findings to suggest an acute cholecystitis at this time. 2. Colonic diverticulosis without evidence of acute diverticulitis at this time. 3. New intermediate attenuation small amount of pericardial fluid and/or thickening. Clinical correlation for signs and symptoms of pericarditis is recommended. 4. Multiple prominent borderline enlarged and mildly enlarged upper abdominal lymph nodes measuring up to 1.7 cm in short axis in the portacaval nodal station, nonspecific, but likely benign (the portacaval lymph node did not demonstrate diffusion restriction on the contemporaneously obtained abdominal MRI). 5. 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 6. Aortic atherosclerosis, in addition to at least right coronary artery disease. Please note that although the presence of coronary artery calcium documents the presence of coronary artery disease, the severity of this disease and any potential stenosis cannot be assessed on this non-gated CT examination. Assessment for potential risk factor modification, dietary therapy or pharmacologic therapy may be warranted, if clinically indicated. 7. Additional incidental findings, as above. Electronically Signed   By: Vinnie Langton M.D.   On: 05/11/2020 20:16   MR ABDOMEN MRCP WO CONTRAST  Result Date: 05/11/2020 CLINICAL DATA:  69 year old male with history of jaundice.  Abdominal pain, most severe in the right upper quadrant. Fever, chills and dyspnea. EXAM: MRI ABDOMEN WITHOUT CONTRAST  (INCLUDING MRCP) TECHNIQUE: Multiplanar multisequence MR imaging of the abdomen was performed. Heavily T2-weighted images of the biliary and pancreatic ducts were obtained, and three-dimensional MRCP images were rendered by post processing. COMPARISON:  No priors. FINDINGS: Comment: Today's study is limited for detection and  characterization of visceral and/or vascular lesions by lack of IV gadolinium. Lower chest: Unremarkable. Hepatobiliary: No definite suspicious cystic or solid hepatic lesions are confidently identified on today's noncontrast examination. No intra or extrahepatic biliary ductal dilatation. Common bile duct measures 4 mm in the porta hepatis. No filling defect in the common bile duct to suggest choledocholithiasis. Numerous filling defects are noted within the gallbladder, compatible with gallstones measuring up to 2.4 cm. Gallbladder is only moderately distended. No definite gallbladder wall thickening or pericholecystic fluid or inflammatory changes. Pancreas: No definite pancreatic mass or peripancreatic fluid collections or inflammatory changes. No pancreatic ductal dilatation noted on MRCP images. Spleen:  Unremarkable. Adrenals/Urinary Tract: Multiple T1 hypointense and T2 hyperintense lesions are noted in the kidneys, incompletely characterized on today's noncontrast examination, but statistically likely to represent cysts, measuring up to 2.1 cm in the upper pole the left kidney. No hydroureteronephrosis in the visualized portions of the abdomen. Adrenal form thickening of the adrenal glands bilaterally which appear to lose signal intensity on out of phase dual echo images, indicative of adenomatous hyperplasia. Stomach/Bowel: Visualized portions are unremarkable. Vascular/Lymphatic: No aneurysm identified in the visualized abdominal vasculature. Mildly enlarged  portacaval lymph node measuring 1.7 cm in short axis, without diffusion restriction. Other: No significant volume of ascites noted in the visualized portions of the peritoneal cavity. Musculoskeletal: No aggressive appearing osseous lesions are noted in the visualized portions of the skeleton. IMPRESSION: 1. Cholelithiasis without evidence of acute cholecystitis at this time. 2. No choledocholithiasis. No findings of biliary tract obstruction. 3. 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 Additional incidental findings, as above. Electronically Signed   By: Vinnie Langton M.D.   On: 05/11/2020 20:15   MR 3D Recon At Scanner  Result Date: 05/11/2020 CLINICAL DATA:  69 year old male with history of jaundice. Abdominal pain, most severe in the right upper quadrant. Fever, chills and dyspnea. EXAM: MRI ABDOMEN WITHOUT CONTRAST  (INCLUDING MRCP) TECHNIQUE: Multiplanar multisequence MR imaging of the abdomen was performed. Heavily T2-weighted images of the biliary and pancreatic ducts were obtained, and three-dimensional MRCP images were rendered by post processing. COMPARISON:  No priors. FINDINGS: Comment: Today's study is limited for detection and characterization of visceral and/or vascular lesions by lack of IV gadolinium. Lower chest: Unremarkable. Hepatobiliary: No definite suspicious cystic or solid hepatic lesions are confidently identified on today's noncontrast examination. No intra or extrahepatic biliary ductal dilatation. Common bile duct measures 4 mm in the porta hepatis. No filling defect in the common bile duct to suggest choledocholithiasis. Numerous filling defects are noted within the gallbladder, compatible with gallstones measuring up to 2.4 cm. Gallbladder is only moderately distended. No definite gallbladder wall thickening or pericholecystic fluid or inflammatory changes. Pancreas: No definite pancreatic mass or peripancreatic fluid collections or inflammatory changes. No  pancreatic ductal dilatation noted on MRCP images. Spleen:  Unremarkable. Adrenals/Urinary Tract: Multiple T1 hypointense and T2 hyperintense lesions are noted in the kidneys, incompletely characterized on today's noncontrast examination, but statistically likely to represent cysts, measuring up to 2.1 cm in the upper pole the left kidney. No hydroureteronephrosis in the visualized portions of the abdomen. Adrenal form thickening of the adrenal glands bilaterally which appear to lose signal intensity on out of phase dual echo images, indicative of adenomatous hyperplasia. Stomach/Bowel: Visualized portions are unremarkable. Vascular/Lymphatic: No aneurysm identified in the visualized abdominal vasculature. Mildly enlarged portacaval lymph node measuring 1.7 cm in short axis, without diffusion restriction. Other: No significant volume of ascites noted in the visualized  portions of the peritoneal cavity. Musculoskeletal: No aggressive appearing osseous lesions are noted in the visualized portions of the skeleton. IMPRESSION: 1. Cholelithiasis without evidence of acute cholecystitis at this time. 2. No choledocholithiasis. No findings of biliary tract obstruction. 3. 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 Additional incidental findings, as above. Electronically Signed   By: Vinnie Langton M.D.   On: 05/11/2020 20:15   DG Chest Port 1 View  Result Date: 05/11/2020 CLINICAL DATA:  Shortness of breath x2 days. EXAM: PORTABLE CHEST 1 VIEW COMPARISON:  May 10, 2020 FINDINGS: Mild, stable diffuse chronic appearing increased interstitial lung markings are seen. There is no evidence of acute infiltrate, pleural effusion or pneumothorax. The cardiac silhouette is markedly enlarged and unchanged in size. There is moderate severity calcification of the aortic arch. Degenerative changes are seen throughout the thoracic spine. IMPRESSION: Stable cardiomegaly without evidence of acute or active  cardiopulmonary disease. Electronically Signed   By: Virgina Norfolk M.D.   On: 05/11/2020 17:40   DG Chest Port 1 View  Result Date: 05/10/2020 CLINICAL DATA:  Shortness of breath. EXAM: PORTABLE CHEST 1 VIEW COMPARISON:  01/26/2020 FINDINGS: Again noted is a rounded pulmonary nodule in the right upper lung zone. There is no pneumothorax. No large pleural effusion. The heart size remains enlarged. The trachea is midline. The lung volumes are slightly low. There is no acute osseous abnormality. Aortic calcifications are again noted. IMPRESSION: 1. No acute cardiopulmonary process. 2. Persistent rounded pulmonary nodule in the right upper lobe. This was present on the patient's CT from 8366 and is almost certainly a benign process. 3. Cardiomegaly with low lung volumes. Electronically Signed   By: Constance Holster M.D.   On: 05/10/2020 19:54  [4 week]   Impression: Pleasant 69 year old gentleman with multiple comorbidities as outlined above, presenting for ongoing right upper quadrant pain, positive blood cultures.  Right upper quadrant pain: Started 2 days ago.  Colicky in nature. Noted to have newly elevated LFTs with total bilirubin initially 3.2 up to 4.1 today, mostly due to elevated direct bilirubin.  Alkaline phosphatase 163 down to 150, AST 54 down to 37, ALT 59 down to 46.  MRCP/MRI abdomen without contrast shows cholelithiasis but no evidence of choledocholithiasis or cholecystitis.  Common bile duct diameter 4 mm.  Gallbladder moderately distended.  Noncontrast CT again with cholelithiasis, no evidence of acute cholecystitis.  Gallbladder only mildly distended.  Ultrasound currently pending.  Patient has been afebrile the last 24 hours.  Blood cultures growing Enterobacteres, Klebsiella pneumonia.  Urine culture pending.  Clinical picture suggestive of mild cholangitis possibly related to recent impacted or passed stone.  Normocytic anemia: Stable.  Likely anemia of chronic  disease.  Plan: 1. Continue antibiotic coverage. 2. Continue supportive measures. 3. Follow-up pending right upper quadrant ultrasound.   4. Follow-up pending urine culture.  We would like to thank you for the opportunity to participate in the care of HERMILO DUTTER.    LOS: 1 day   Addendum:  Limited ultrasound shows cholelithiasis with layering sludge within the gallbladder.  No sonographic evidence of acute cholecystitis.  Common bile duct 6 mm.  Discussed with Dr. Gala Romney.  No indication for ERCP at this time.  Patient could have transiently biliary obstruction from gallstone.  We will plan to trend LFTs.  Continue antibiotic coverage.  Trial low-fat diet if clear liquids tolerated  Laureen Ochs. Bernarda Caffey [facility] [phone] 12/9/20211:16 PM

## 2020-05-12 NOTE — Progress Notes (Signed)
PROGRESS NOTE    Eric Scott  PFX:[id number] DOB: January 06, 1951 DOA: 05/11/2020 PCP: [name], MD    Brief Narrative:  69 year old male with a history of chronic kidney disease stage IV, diabetes, hypertension, chronic diastolic congestive heart failure, was initially evaluated in the emergency room on 12/7 with complaints of shortness of breath.  He was not found to have any pneumonia.  Blood cultures were sent, but patient was felt stable for discharge.  The following day, he was found to have positive blood cultures with Klebsiella and was advised to return to the emergency room.  Upon arrival, he did admit to having fever, chills and some right upper quadrant pain.  Work-up in the emergency room showed elevated liver enzymes.  Imaging indicated cholelithiasis without any evidence of choledocholithiasis.  Patient was admitted for further treatments   Assessment & Plan:   Active Problems:   Type 2 diabetes mellitus with stage 4 chronic kidney disease, with [place]-term current use of insulin ([name])   HTN (hypertension)   Anxiety   Arterial insufficiency of lower extremity ([name])   Chronic respiratory failure with hypoxia ([name])   Bacteremia   Elevated liver enzymes   Cholelithiasis   Klebsiella bacteremia -Source is unclear, although may be biliary -Continue on ceftriaxone  Elevated liver function tests -Noted to have elevated bilirubin -Imaging does not indicate any evidence of choledocholithiasis and bile duct is not enlarged -Patient does have evidence of cholelithiasis and may have passed a stone -May have developed transient cholangitis -Continue IV antibiotics -Discussed with Dr. Arnoldo Morale who will evaluate in the a.m.  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    Consultants:   Gastroenterology  Procedures:     Antimicrobials:   Cefepime 12/8 > 12/9  Ceftriaxone 12/9 >   Subjective: Patient reports improvement of right upper quadrant abdominal pain.  Overall shortness of breath is better.  No nausea or vomiting.  Objective: Vitals:   05/12/20 0951 05/12/20 1100 05/12/20 1200 05/12/20 1635  BP:      Pulse: 74  (!) 47 (!) 50  Resp:   15 19  Temp:  97.7 F (36.5 C)  97.6 F (36.4 C)  TempSrc:  Oral  Oral  SpO2:    97%  Weight:  129.9 kg    Height:  6' (1.829 m)      Intake/Output Summary (Last 24 hours) at 05/12/2020 1903 Last data filed at  05/12/2020 1822 Gross per 24 hour  Intake 800 ml  Output 875 ml  Net -75 ml   Filed Weights   05/11/20 1522 05/12/20 1100  Weight: 130.2 kg 129.9 kg    Examination:  General exam: Appears calm and comfortable  Respiratory system: Clear to auscultation. Respiratory effort normal. Cardiovascular system: S1 & S2 heard, RRR. No JVD, murmurs, rubs, gallops or clicks. No pedal edema. Gastrointestinal system: Abdomen is nondistended, soft and nontender. No organomegaly or masses felt.  Normal bowel sounds heard. Central nervous system: Alert and oriented. No focal neurological deficits. Extremities: Symmetric 5 x 5 power. Skin: No rashes, lesions or ulcers Psychiatry: Judgement and insight appear normal. Mood & affect appropriate.     Data Reviewed: I have personally reviewed following labs and imaging studies  CBC: Recent Labs  Lab 05/10/20 1855 05/11/20 1621 05/12/20 0252  WBC 16.3* 7.8 7.0  NEUTROABS 15.2* 7.0  --   HGB 10.6* 9.8* 9.2*  HCT 33.9* 31.3* 30.1*  MCV 93.6 92.9 94.7  PLT 218 184 517   Basic Metabolic Panel: Recent Labs  Lab 05/10/20 1855 05/11/20 1621 05/12/20 0252  NA 139 138 135  K 3.2* 2.9* 4.0  CL 98 98 96*  CO2 28 27 24   GLUCOSE 192* 186* 267*  BUN 44* 45* 50*  CREATININE 3.61* 3.73* 3.90*  CALCIUM 7.4* 7.4* 7.2*   GFR: Estimated Creatinine Clearance: 24.9 mL/min (A) (by C-G formula based on SCr of 3.9 mg/dL (H)). Liver Function Tests: Recent Labs  Lab 05/10/20 1855 05/11/20 1621 05/12/20 0252  AST 54* 44* 37  ALT 59* 48* 46*  ALKPHOS 163* 155* 150*  BILITOT 3.2* 3.9* 4.1*  PROT 7.3 6.8 6.5  ALBUMIN 2.7* 2.5* 2.4*   No results for input(s): LIPASE, AMYLASE in the last 168 hours. No results for input(s): AMMONIA in the last 168 hours. Coagulation Profile: Recent Labs  Lab 05/10/20 1855 05/11/20 1621  INR 1.3* 1.3*   Cardiac Enzymes: No results for input(s): CKTOTAL, CKMB, CKMBINDEX, TROPONINI in the last 168 hours. BNP (last 3 results) No results for input(s): PROBNP in the last 8760 hours. HbA1C: No results for input(s): HGBA1C in the last 72 hours. CBG: No results for input(s): GLUCAP in the last 168 hours. Lipid Profile: No results for input(s): CHOL, HDL, LDLCALC, TRIG, CHOLHDL, LDLDIRECT in the last 72 hours. Thyroid Function Tests: No results for input(s): TSH, T4TOTAL, FREET4, T3FREE, THYROIDAB in the last 72 hours. Anemia Panel: No results for input(s): VITAMINB12, FOLATE, FERRITIN, TIBC, IRON,  RETICCTPCT in the last 72 hours. Sepsis Labs: Recent Labs  Lab 05/10/20 1855 05/10/20 2048 05/11/20 1621 05/11/20 1815 05/11/20 2112 05/12/20 0252  PROCALCITON  --   --   --  17.24  --  29.13  LATICACIDVEN 1.5 1.0 0.9  --  1.5  --     Recent Results (from the past 240 hour(s))  Resp Panel by RT-PCR (Flu A&B, Covid) Nasopharyngeal Swab     Status: None   Collection Time: 05/10/20  6:55 PM   Specimen: Nasopharyngeal Swab; Nasopharyngeal(NP) swabs in vial transport medium  Result Value Ref Range Status   SARS Coronavirus 2 by RT PCR NEGATIVE NEGATIVE Final    Comment: (NOTE) SARS-CoV-2 target nucleic acids are NOT DETECTED.  The SARS-CoV-2 RNA is generally detectable in upper respiratory specimens during the acute phase of infection. The lowest concentration of SARS-CoV-2 viral copies this assay can detect is 138 copies/mL. A negative result does not preclude SARS-Cov-2 infection and should  not be used as the sole basis for treatment or other patient management decisions. A negative result may occur with  improper specimen collection/handling, submission of specimen other than nasopharyngeal swab, presence of viral mutation(s) within the areas targeted by this assay, and inadequate number of viral copies(<138 copies/mL). A negative result must be combined with clinical observations, patient history, and epidemiological information. The expected result is Negative.  Fact Sheet for Patients:  EntrepreneurPulse.com.au  Fact Sheet for Healthcare Providers:  IncredibleEmployment.be  This test is no t yet approved or cleared by the [place] FDA and  has been authorized for detection and/or diagnosis of SARS-CoV-2 by FDA under an Emergency Use Authorization (EUA). This EUA will remain  in effect (meaning this test can be used) for the duration of the COVID-19 declaration under Section 564(b)(1) of the Act, 21 U.S.C.section 360bbb-3(b)(1),  unless the authorization is terminated  or revoked sooner.       Influenza A by PCR NEGATIVE NEGATIVE Final   Influenza B by PCR NEGATIVE NEGATIVE Final    Comment: (NOTE) The Xpert Xpress SARS-CoV-2/FLU/RSV plus assay is intended as an aid in the diagnosis of influenza from Nasopharyngeal swab specimens and should not be used as a sole basis for treatment. Nasal washings and aspirates are unacceptable for Xpert Xpress SARS-CoV-2/FLU/RSV testing.  Fact Sheet for Patients: EntrepreneurPulse.com.au  Fact Sheet for Healthcare Providers: IncredibleEmployment.be  This test is not yet approved or cleared by the [place] FDA and has been authorized for detection and/or diagnosis of SARS-CoV-2 by FDA under an Emergency Use Authorization (EUA). This EUA will remain in effect (meaning this test can be used) for the duration of the COVID-19 declaration under Section 564(b)(1) of the Act, 21 U.S.C. section 360bbb-3(b)(1), unless the authorization is terminated or revoked.  Performed at [facility], [address] [place], [place] [address]   Culture, blood (Routine x 2)     Status: None (Preliminary result)   Collection Time: 05/10/20  8:42 PM   Specimen: BLOOD RIGHT HAND  Result Value Ref Range Status   Specimen Description BLOOD RIGHT HAND  Final   Special Requests   Final    BOTTLES DRAWN AEROBIC AND ANAEROBIC Blood Culture adequate volume   Culture   Final    NO GROWTH 2 DAYS Performed at [facility], [address], [place], [place] [address]    Report Status PENDING  Incomplete  Culture, blood (Routine x 2)     Status: Abnormal (Preliminary result)   Collection Time: 05/10/20  8:48 PM   Specimen: BLOOD RIGHT ARM  Result Value Ref Range Status   Specimen Description   Final    BLOOD RIGHT ARM Performed at [facility], [address], [place], [place] [address]    Special Requests   Final    BOTTLES DRAWN AEROBIC AND ANAEROBIC Blood  Culture adequate volume Performed at [facility], [address] [place], [place] [address]    Culture  Setup Time   Final    GRAM NEGATIVE RODS AEROBIC BOTTLE ONLY Gram Stain Report Called to,Read Back By and Verified With: TRAVIS DEVOLT @1212  05/11/20 BY JONES,T [facility] Organism ID to follow CRITICAL RESULT CALLED TO, READ BACK BY AND VERIFIED WITH: Gabriel Earing RN 05/11/20 at 1831 sk    Culture (A)  Final    KLEBSIELLA PNEUMONIAE SUSCEPTIBILITIES TO FOLLOW Performed at [facility], [place] [address] [place], [place] [address]    Report Status PENDING  Incomplete  Blood Culture ID  Panel (Reflexed)     Status: Abnormal   Collection Time: 05/10/20  8:48 PM  Result Value Ref Range Status   Enterococcus faecalis NOT DETECTED NOT DETECTED Final   Enterococcus Faecium NOT DETECTED NOT DETECTED Final   Listeria monocytogenes NOT DETECTED NOT DETECTED Final   Staphylococcus species NOT DETECTED NOT DETECTED Final   Staphylococcus aureus (BCID) NOT DETECTED NOT DETECTED Final   Staphylococcus epidermidis NOT DETECTED NOT DETECTED Final   Staphylococcus lugdunensis NOT DETECTED NOT DETECTED Final   Streptococcus species NOT DETECTED NOT DETECTED Final   Streptococcus agalactiae NOT DETECTED NOT DETECTED Final   Streptococcus pneumoniae NOT DETECTED NOT DETECTED Final   Streptococcus pyogenes NOT DETECTED NOT DETECTED Final   A.calcoaceticus-baumannii NOT DETECTED NOT DETECTED Final   Bacteroides fragilis NOT DETECTED NOT DETECTED Final   Enterobacterales DETECTED (A) NOT DETECTED Final    Comment: Enterobacterales represent a large order of gram negative bacteria, not a single organism. CRITICAL RESULT CALLED TO, READ BACK BY AND VERIFIED WITH: M WHITE RN 05/11/20 AT 8841 SK     Enterobacter cloacae complex NOT DETECTED NOT DETECTED Final   Escherichia coli NOT DETECTED NOT DETECTED Final   Klebsiella aerogenes NOT DETECTED NOT DETECTED Final   Klebsiella oxytoca NOT DETECTED NOT  DETECTED Final   Klebsiella pneumoniae DETECTED (A) NOT DETECTED Final    Comment: CRITICAL RESULT CALLED TO, READ BACK BY AND VERIFIED WITH: M WHITE RN 05/11/20 AT 1831 SK     Proteus species NOT DETECTED NOT DETECTED Final   Salmonella species NOT DETECTED NOT DETECTED Final   Serratia marcescens NOT DETECTED NOT DETECTED Final   Haemophilus influenzae NOT DETECTED NOT DETECTED Final   Neisseria meningitidis NOT DETECTED NOT DETECTED Final   Pseudomonas aeruginosa NOT DETECTED NOT DETECTED Final   Stenotrophomonas maltophilia NOT DETECTED NOT DETECTED Final   Candida albicans NOT DETECTED NOT DETECTED Final   Candida auris NOT DETECTED NOT DETECTED Final   Candida glabrata NOT DETECTED NOT DETECTED Final   Candida krusei NOT DETECTED NOT DETECTED Final   Candida parapsilosis NOT DETECTED NOT DETECTED Final   Candida tropicalis NOT DETECTED NOT DETECTED Final   Cryptococcus neoformans/gattii NOT DETECTED NOT DETECTED Final   CTX-M ESBL NOT DETECTED NOT DETECTED Final   Carbapenem resistance IMP NOT DETECTED NOT DETECTED Final   Carbapenem resistance KPC NOT DETECTED NOT DETECTED Final   Carbapenem resistance NDM NOT DETECTED NOT DETECTED Final   Carbapenem resist OXA 48 LIKE NOT DETECTED NOT DETECTED Final   Carbapenem resistance VIM NOT DETECTED NOT DETECTED Final    Comment: Performed at [facility], [address], [place], [place] [address]  Resp Panel by RT-PCR (Flu A&B, Covid) Nasopharyngeal Swab     Status: None   Collection Time: 05/11/20  6:20 PM   Specimen: Nasopharyngeal Swab; Nasopharyngeal(NP) swabs in vial transport medium  Result Value Ref Range Status   SARS Coronavirus 2 by RT PCR NEGATIVE NEGATIVE Final    Comment: (NOTE) SARS-CoV-2 target nucleic acids are NOT DETECTED.  The SARS-CoV-2 RNA is generally detectable in upper respiratory specimens during the acute phase of infection. The lowest concentration of SARS-CoV-2 viral copies this assay can detect  is 138 copies/mL. A negative result does not preclude SARS-Cov-2 infection and should not be used as the sole basis for treatment or other patient management decisions. A negative result may occur with  improper specimen collection/handling, submission of specimen other than nasopharyngeal swab, presence of viral mutation(s) within the  areas targeted by this assay, and inadequate number of viral copies(<138 copies/mL). A negative result must be combined with clinical observations, patient history, and epidemiological information. The expected result is Negative.  Fact Sheet for Patients:  EntrepreneurPulse.com.au  Fact Sheet for Healthcare Providers:  IncredibleEmployment.be  This test is no t yet approved or cleared by the [place] FDA and  has been authorized for detection and/or diagnosis of SARS-CoV-2 by FDA under an Emergency Use Authorization (EUA). This EUA will remain  in effect (meaning this test can be used) for the duration of the COVID-19 declaration under Section 564(b)(1) of the Act, 21 U.S.C.section 360bbb-3(b)(1), unless the authorization is terminated  or revoked sooner.       Influenza A by PCR NEGATIVE NEGATIVE Final   Influenza B by PCR NEGATIVE NEGATIVE Final    Comment: (NOTE) The Xpert Xpress SARS-CoV-2/FLU/RSV plus assay is intended as an aid in the diagnosis of influenza from Nasopharyngeal swab specimens and should not be used as a sole basis for treatment. Nasal washings and aspirates are unacceptable for Xpert Xpress SARS-CoV-2/FLU/RSV testing.  Fact Sheet for Patients: EntrepreneurPulse.com.au  Fact Sheet for Healthcare Providers: IncredibleEmployment.be  This test is not yet approved or cleared by the [place] FDA and has been authorized for detection and/or diagnosis of SARS-CoV-2 by FDA under an Emergency Use Authorization (EUA). This EUA will remain in effect  (meaning this test can be used) for the duration of the COVID-19 declaration under Section 564(b)(1) of the Act, 21 U.S.C. section 360bbb-3(b)(1), unless the authorization is terminated or revoked.  Performed at [facility], [address], [place], [place] [address]   Culture, blood (routine x 2)     Status: None (Preliminary result)   Collection Time: 05/11/20  9:05 PM   Specimen: BLOOD LEFT ARM  Result Value Ref Range Status   Specimen Description BLOOD LEFT ARM  Final   Special Requests   Final    BOTTLES DRAWN AEROBIC AND ANAEROBIC Blood Culture adequate volume   Culture   Final    NO GROWTH < 12 HOURS Performed at [facility], [address] [place], [place] [address]    Report Status PENDING  Incomplete  Culture, blood (routine x 2)     Status: None (Preliminary result)   Collection Time: 05/11/20  9:12 PM   Specimen: BLOOD LEFT HAND  Result Value Ref Range Status   Specimen Description BLOOD LEFT HAND  Final   Special Requests   Final    BOTTLES DRAWN AEROBIC AND ANAEROBIC Blood Culture adequate volume   Culture   Final    NO GROWTH < 12 HOURS Performed at [facility], [address], [place], [place] [address]    Report Status PENDING  Incomplete  MRSA PCR Screening     Status: None   Collection Time: 05/12/20 11:37 AM   Specimen: Nasal Mucosa; Nasopharyngeal  Result Value Ref Range Status   MRSA by PCR NEGATIVE NEGATIVE Final    Comment:        The GeneXpert MRSA Assay (FDA approved for NASAL specimens only), is one component of a comprehensive MRSA colonization surveillance program. It is not intended to diagnose MRSA infection nor to guide or monitor treatment for MRSA infections. Performed at [facility], [address] [place], [place] [address]          Radiology Studies: CT ABDOMEN PELVIS WO CONTRAST  Result Date: 05/11/2020 CLINICAL DATA:  69 year old male with history of elevated liver function tests.  Cholelithiasis. Generalized weakness,  fatigue and diffuse abdominal pain, most severe on the right side for the past 2 days. EXAM: CT ABDOMEN AND PELVIS WITHOUT CONTRAST TECHNIQUE: Multidetector CT imaging of the abdomen and pelvis was performed following the standard protocol without IV contrast. COMPARISON:  CT the abdomen and pelvis 11/23/2016. FINDINGS: Lower chest: Atherosclerotic calcifications in the descending thoracic aorta as well as the right coronary artery. Small amount of pericardial fluid and/or thickening, wound with intermediate attenuation fluid. No definite pericardial calcification. Hepatobiliary: Diffuse low attenuation, indicative throughout the of hepatic steatosis. Hepatic parenchyma multiple faintly peripherally calcified gallstones measuring up to 2.7 cm in the gallbladder. Gallbladder does not appear overly distended. No pericholecystic fluid or inflammatory changes. Pancreas: No definite pancreatic mass or peripancreatic fluid collections or inflammatory changes are noted on today's noncontrast CT examination. Spleen: Unremarkable. Adrenals/Urinary Tract: Low-attenuation adrenal form thickening in the adrenal glands bilaterally, likely to represent adenomatous hyperplasia. Multiple small renal lesions bilaterally, incompletely characterized on today's noncontrast CT but ranging from low to intermediate attenuation. No hydroureteronephrosis. Urinary bladder is unremarkable in appearance. Stomach/Bowel: Normal appearance of the stomach. No pathologic dilatation of small bowel or colon. Numerous colonic diverticulae are noted, without surrounding inflammatory changes to suggest an acute diverticulitis at this time. Normal appendix. Vascular/Lymphatic: Aortic atherosclerosis. Multiple prominent borderline enlarged and mildly enlarged upper abdominal lymph nodes are noted, measuring up to 1.7 cm in short axis in the portacaval nodal station. Reproductive: Prostate gland and seminal vesicles are unremarkable in appearance. Other:  No significant volume of ascites.  No pneumoperitoneum. Musculoskeletal: There are no aggressive appearing lytic or blastic lesions noted in the visualized portions of the skeleton. IMPRESSION: 1. Cholelithiasis without definitive findings to suggest an acute cholecystitis at this time. 2. Colonic diverticulosis without evidence of acute diverticulitis at this time. 3. New intermediate attenuation small amount of pericardial fluid and/or thickening. Clinical correlation for signs and symptoms of pericarditis is recommended. 4. Multiple prominent borderline enlarged and mildly enlarged upper abdominal lymph nodes measuring up to 1.7 cm in short axis in the portacaval nodal station, nonspecific, but likely benign (the portacaval lymph node did not demonstrate diffusion restriction on the contemporaneously obtained abdominal MRI). 5. 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 6. Aortic atherosclerosis, in addition to at least right coronary artery disease. Please note that although the presence of coronary artery calcium documents the presence of coronary artery disease, the severity of this disease and any potential stenosis cannot be assessed on this non-gated CT examination. Assessment for potential risk factor modification, dietary therapy or pharmacologic therapy may be warranted, if clinically indicated. 7. Additional incidental findings, as above. Electronically Signed   By: Vinnie Langton M.D.   On: 05/11/2020 20:16   MR ABDOMEN MRCP WO CONTRAST  Result Date: 05/11/2020 CLINICAL DATA:  69 year old male with history of jaundice. Abdominal pain, most severe in the right upper quadrant. Fever, chills and dyspnea. EXAM: MRI ABDOMEN WITHOUT CONTRAST  (INCLUDING MRCP) TECHNIQUE: Multiplanar multisequence MR imaging of the abdomen was performed. Heavily T2-weighted images of the biliary and pancreatic ducts were obtained, and three-dimensional MRCP images were rendered by post processing.  COMPARISON:  No priors. FINDINGS: Comment: Today's study is limited for detection and characterization of visceral and/or vascular lesions by lack of IV gadolinium. Lower chest: Unremarkable. Hepatobiliary: No definite suspicious cystic or solid hepatic lesions are confidently identified on today's noncontrast examination. No intra or extrahepatic biliary ductal dilatation. Common bile duct measures 4 mm in the porta hepatis. No  filling defect in the common bile duct to suggest choledocholithiasis. Numerous filling defects are noted within the gallbladder, compatible with gallstones measuring up to 2.4 cm. Gallbladder is only moderately distended. No definite gallbladder wall thickening or pericholecystic fluid or inflammatory changes. Pancreas: No definite pancreatic mass or peripancreatic fluid collections or inflammatory changes. No pancreatic ductal dilatation noted on MRCP images. Spleen:  Unremarkable. Adrenals/Urinary Tract: Multiple T1 hypointense and T2 hyperintense lesions are noted in the kidneys, incompletely characterized on today's noncontrast examination, but statistically likely to represent cysts, measuring up to 2.1 cm in the upper pole the left kidney. No hydroureteronephrosis in the visualized portions of the abdomen. Adrenal form thickening of the adrenal glands bilaterally which appear to lose signal intensity on out of phase dual echo images, indicative of adenomatous hyperplasia. Stomach/Bowel: Visualized portions are unremarkable. Vascular/Lymphatic: No aneurysm identified in the visualized abdominal vasculature. Mildly enlarged portacaval lymph node measuring 1.7 cm in short axis, without diffusion restriction. Other: No significant volume of ascites noted in the visualized portions of the peritoneal cavity. Musculoskeletal: No aggressive appearing osseous lesions are noted in the visualized portions of the skeleton. IMPRESSION: 1. Cholelithiasis without evidence of acute cholecystitis at  this time. 2. No choledocholithiasis. No findings of biliary tract obstruction. 3. 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 Additional incidental findings, as above. Electronically Signed   By: Vinnie Langton M.D.   On: 05/11/2020 20:15   MR 3D Recon At Scanner  Result Date: 05/11/2020 CLINICAL DATA:  69 year old male with history of jaundice. Abdominal pain, most severe in the right upper quadrant. Fever, chills and dyspnea. EXAM: MRI ABDOMEN WITHOUT CONTRAST  (INCLUDING MRCP) TECHNIQUE: Multiplanar multisequence MR imaging of the abdomen was performed. Heavily T2-weighted images of the biliary and pancreatic ducts were obtained, and three-dimensional MRCP images were rendered by post processing. COMPARISON:  No priors. FINDINGS: Comment: Today's study is limited for detection and characterization of visceral and/or vascular lesions by lack of IV gadolinium. Lower chest: Unremarkable. Hepatobiliary: No definite suspicious cystic or solid hepatic lesions are confidently identified on today's noncontrast examination. No intra or extrahepatic biliary ductal dilatation. Common bile duct measures 4 mm in the porta hepatis. No filling defect in the common bile duct to suggest choledocholithiasis. Numerous filling defects are noted within the gallbladder, compatible with gallstones measuring up to 2.4 cm. Gallbladder is only moderately distended. No definite gallbladder wall thickening or pericholecystic fluid or inflammatory changes. Pancreas: No definite pancreatic mass or peripancreatic fluid collections or inflammatory changes. No pancreatic ductal dilatation noted on MRCP images. Spleen:  Unremarkable. Adrenals/Urinary Tract: Multiple T1 hypointense and T2 hyperintense lesions are noted in the kidneys, incompletely characterized on today's noncontrast examination, but statistically likely to represent cysts, measuring up to 2.1 cm in the upper pole the left kidney. No hydroureteronephrosis  in the visualized portions of the abdomen. Adrenal form thickening of the adrenal glands bilaterally which appear to lose signal intensity on out of phase dual echo images, indicative of adenomatous hyperplasia. Stomach/Bowel: Visualized portions are unremarkable. Vascular/Lymphatic: No aneurysm identified in the visualized abdominal vasculature. Mildly enlarged portacaval lymph node measuring 1.7 cm in short axis, without diffusion restriction. Other: No significant volume of ascites noted in the visualized portions of the peritoneal cavity. Musculoskeletal: No aggressive appearing osseous lesions are noted in the visualized portions of the skeleton. IMPRESSION: 1. Cholelithiasis without evidence of acute cholecystitis at this time. 2. No choledocholithiasis. No findings of biliary tract obstruction. 3. 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 Additional incidental findings, as above. Electronically Signed   By: Vinnie Langton M.D.   On: 05/11/2020 20:15   US Abdomen Limited  Result Date: 05/12/2020 CLINICAL DATA:  Elevated LFTs EXAM: ULTRASOUND ABDOMEN LIMITED RIGHT UPPER QUADRANT COMPARISON:  None. FINDINGS: Gallbladder: 2.6 cm gallstone within the gallbladder. Sludge also noted layering in the gallbladder. No wall thickening or sonographic Murphy's. Common bile duct: Diameter: Normal caliber, 6 mm Liver: No focal lesion identified. Within normal limits in parenchymal echogenicity. Portal vein is patent on color Doppler imaging with normal direction of blood flow towards the liver. Other: None. IMPRESSION: Cholelithiasis and layering sludge within the gallbladder. No sonographic evidence of acute cholecystitis. Electronically Signed   By: Rolm Baptise M.D.   On: 05/12/2020 09:27   DG Chest Port 1 View  Result Date: 05/11/2020 CLINICAL DATA:  Shortness of breath x2 days. EXAM: PORTABLE CHEST 1 VIEW COMPARISON:  May 10, 2020 FINDINGS: Mild, stable diffuse chronic appearing  increased interstitial lung markings are seen. There is no evidence of acute infiltrate, pleural effusion or pneumothorax. The cardiac silhouette is markedly enlarged and unchanged in size. There is moderate severity calcification of the aortic arch. Degenerative changes are seen throughout the thoracic spine. IMPRESSION: Stable cardiomegaly without evidence of acute or active cardiopulmonary disease. Electronically Signed   By: Virgina Norfolk M.D.   On: 05/11/2020 17:40   DG Chest Port 1 View  Result Date: 05/10/2020 CLINICAL DATA:  Shortness of breath. EXAM: PORTABLE CHEST 1 VIEW COMPARISON:  01/26/2020 FINDINGS: Again noted is a rounded pulmonary nodule in the right upper lung zone. There is no pneumothorax. No large pleural effusion. The heart size remains enlarged. The trachea is midline. The lung volumes are slightly low. There is no acute osseous abnormality. Aortic calcifications are again noted. IMPRESSION: 1. No acute cardiopulmonary process. 2. Persistent rounded pulmonary nodule in the right upper lobe. This was present on the patient's CT from 2703 and is almost certainly a benign process. 3. Cardiomegaly with low lung volumes. Electronically Signed   By: Constance Holster M.D.   On: 05/10/2020 19:54        Scheduled Meds: . allopurinol  100 mg Oral Daily  . amLODipine  10 mg Oral Daily  . aspirin EC  81 mg Oral Q breakfast  . calcitRIOL  0.25 mcg Oral Daily  . Chlorhexidine Gluconate Cloth  6 each Topical Daily  . cholecalciferol  5,000 Units Oral Daily  . FLUoxetine  20 mg Oral Daily  . fluticasone furoate-vilanterol  2 puff Inhalation Daily  . gabapentin  300 mg Oral QHS  . heparin  5,000 Units Subcutaneous Q8H  . insulin aspart  0-5 Units Subcutaneous QHS  . [START ON 05/13/2020] insulin aspart  0-9 Units Subcutaneous TID WC  . insulin glargine  30 Units Subcutaneous QHS  . ipratropium-albuterol  3 mL Nebulization BID  . metoprolol tartrate  50 mg Oral BID  .  multivitamin with minerals  1 tablet Oral Daily  . omega-3 acid ethyl esters  1 g Oral BID  . pantoprazole  40 mg Oral QAC breakfast  . tamsulosin  0.4 mg Oral QPC supper  . cyanocobalamin  1,000 mcg Oral Daily   Continuous Infusions: . cefTRIAXone (ROCEPHIN)  IV 2 g (05/12/20 1420)     LOS: 1 day    Time spent: 12mins    Kathie Dike, MD [facility]   If 7PM-7AM, please contact night-coverage [email or web address]  05/12/2020, 7:03 PM

## 2020-05-13 DIAGNOSIS — D649 Anemia, unspecified: Secondary | ICD-10-CM

## 2020-05-13 DIAGNOSIS — R109 Unspecified abdominal pain: Secondary | ICD-10-CM

## 2020-05-13 LAB — COMPREHENSIVE METABOLIC PANEL
ALT: 46 U/L — ABNORMAL HIGH (ref 0–44)
AST: 51 U/L — ABNORMAL HIGH (ref 15–41)
Albumin: 2.5 g/dL — ABNORMAL LOW (ref 3.5–5.0)
Alkaline Phosphatase: 141 U/L — ABNORMAL HIGH (ref 38–126)
Anion gap: 13 (ref 5–15)
BUN: 60 mg/dL — ABNORMAL HIGH (ref 8–23)
CO2: 25 mmol/L (ref 22–32)
Calcium: 7.5 mg/dL — ABNORMAL LOW (ref 8.9–10.3)
Chloride: 96 mmol/L — ABNORMAL LOW (ref 98–111)
Creatinine, Ser: 3.37 mg/dL — ABNORMAL HIGH (ref 0.61–1.24)
GFR, Estimated: 19 mL/min — ABNORMAL LOW (ref >60)
Glucose, Bld: 339 mg/dL — ABNORMAL HIGH (ref 70–99)
Potassium: 3.7 mmol/L (ref 3.5–5.1)
Sodium: 134 mmol/L — ABNORMAL LOW (ref 135–145)
Total Bilirubin: 2.2 mg/dL — ABNORMAL HIGH (ref 0.3–1.2)
Total Protein: 6.6 g/dL (ref 6.5–8.1)

## 2020-05-13 LAB — HEMOGLOBIN A1C
Hgb A1c MFr Bld: 6.5 % — ABNORMAL HIGH (ref 4.8–5.6)
Mean Plasma Glucose: 139.85 mg/dL

## 2020-05-13 LAB — URINE CULTURE: Culture: <10000 — AB

## 2020-05-13 LAB — GLUCOSE, CAPILLARY
Glucose-Capillary: 300 mg/dL — ABNORMAL HIGH (ref 70–99)
Glucose-Capillary: 276 mg/dL — ABNORMAL HIGH (ref 70–99)
Glucose-Capillary: 306 mg/dL — ABNORMAL HIGH (ref 70–99)
Glucose-Capillary: 234 mg/dL — ABNORMAL HIGH (ref 70–99)

## 2020-05-13 LAB — CBC
HCT: 29.6 % — ABNORMAL LOW (ref 39.0–52.0)
Hemoglobin: 9.2 g/dL — ABNORMAL LOW (ref 13.0–17.0)
MCH: 28.2 pg (ref 26.0–34.0)
MCHC: 31.1 g/dL (ref 30.0–36.0)
MCV: 90.8 fL (ref 80.0–100.0)
Platelets: 153 10*3/uL (ref 150–400)
RBC: 3.26 MIL/uL — ABNORMAL LOW (ref 4.22–5.81)
RDW: 15.8 % — ABNORMAL HIGH (ref 11.5–15.5)
WBC: 4.8 10*3/uL (ref 4.0–10.5)
nRBC: 0 % (ref 0.0–0.2)

## 2020-05-13 LAB — MAGNESIUM: Magnesium: 1.5 mg/dL — ABNORMAL LOW (ref 1.7–2.4)

## 2020-05-13 LAB — CULTURE, BLOOD (ROUTINE X 2): Special Requests: ADEQUATE

## 2020-05-13 LAB — PROCALCITONIN: Procalcitonin: 33.5 ng/mL

## 2020-05-13 MED ORDER — INSULIN GLARGINE 100 UNIT/ML ~~LOC~~ SOLN
40.0000 [IU] | Freq: Every day | SUBCUTANEOUS | Status: DC
  Administered 2020-05-13: 40 [IU] via SUBCUTANEOUS
  Filled 2020-05-13 (×3): qty 0.4

## 2020-05-13 MED ORDER — INSULIN ASPART 100 UNIT/ML ~~LOC~~ SOLN
3.0000 [IU] | Freq: Three times a day (TID) | SUBCUTANEOUS | Status: DC
  Administered 2020-05-13 – 2020-05-14 (×4): 3 [IU] via SUBCUTANEOUS

## 2020-05-13 MED ORDER — MAGNESIUM SULFATE 2 GM/50ML IV SOLN
2.0000 g | Freq: Once | INTRAVENOUS | Status: AC
  Administered 2020-05-13: 2 g via INTRAVENOUS
  Filled 2020-05-13: qty 50

## 2020-05-13 MED ORDER — ALBUTEROL SULFATE HFA 108 (90 BASE) MCG/ACT IN AERS
2.0000 | INHALATION_SPRAY | Freq: Once | RESPIRATORY_TRACT | Status: AC
  Administered 2020-05-13: 2 via RESPIRATORY_TRACT

## 2020-05-13 NOTE — Progress Notes (Signed)
PROGRESS NOTE    Eric Scott  XLK:[id number] DOB: 22-Dec-1950 DOA: 05/11/2020 PCP: [name], MD    Brief Narrative:  69 year old male with a history of chronic kidney disease stage IV, diabetes, hypertension, chronic diastolic congestive heart failure, was initially evaluated in the emergency room on 12/7 with complaints of shortness of breath.  He was not found to have any pneumonia.  Blood cultures were sent, but patient was felt stable for discharge.  The following day, he was found to have positive blood cultures with Klebsiella and was advised to return to the emergency room.  Upon arrival, he did admit to having fever, chills and some right upper quadrant pain.  Work-up in the emergency room showed elevated liver enzymes.  Imaging indicated cholelithiasis without any evidence of choledocholithiasis.  Patient was admitted for further treatments   Assessment & Plan:   Active Problems:   Type 2 diabetes mellitus with stage 4 chronic kidney disease, with Macmurray-term current use of insulin ([name])   HTN (hypertension)   Anxiety   Arterial insufficiency of lower extremity ([name])   Chronic respiratory failure with hypoxia ([name])   Bacteremia   Elevated liver enzymes   Cholelithiasis   Abdominal pain   Klebsiella bacteremia -Source is unclear, although may be biliary -Continue on ceftriaxone  Elevated liver function tests -Noted to have elevated bilirubin -Imaging does not indicate any evidence of choledocholithiasis and bile duct is not enlarged -Patient does have evidence of cholelithiasis and may have passed a stone -May have developed transient cholangitis -Continue IV antibiotics -Seen by general surgery, appreciate input -Continue to monitor another 24 hours  COPD with chronic hypoxic respiratory failure -No further wheezing at this time -Respiratory status appears to be at baseline on chronic oxygen requirement of 3 L -Continue bronchodilators as needed  Chronic kidney  disease stage IV -Creatinine currently at baseline -Continue to monitor  Diabetes -Continue Lantus and sliding scale -Blood sugars are running high -Increase Lantus and added NovoLog with meals  Hypertension -Restart amlodipine -If blood pressure remains elevated, can consider restarting metoprolol -Currently normotensive  Hypokalemia -Replaced -Magnesium is low and will be replaced  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    Consultants:   Gastroenterology  Procedures:     Antimicrobials:   Cefepime 12/8 > 12/9  Ceftriaxone 12/9 >   Subjective: Patient says that he is feeling better.  Tolerating diet.  Still has some mild right upper quadrant pain.  No nausea or vomiting.  Objective: Vitals:   05/13/20 1000 05/13/20 1100 05/13/20 1142 05/13/20 1200  BP: (!) 161/116 135/65  (!) 129/56  Pulse: 86 73  87  Resp: 17 16  15   Temp:   (!) 97.5 F (36.4 C)   TempSrc:   Oral   SpO2: 100% 100%  100%  Weight:  Height:        Intake/Output Summary (Last 24 hours) at 05/13/2020 1324 Last data filed at 05/12/2020 1822 Gross per 24 hour  Intake --  Output 875 ml  Net -875 ml   Filed Weights   05/11/20 1522 05/12/20 1100  Weight: 130.2 kg 129.9 kg    Examination:  General exam: Alert, awake, oriented x 3 Respiratory system: Clear to auscultation. Respiratory effort normal. Cardiovascular system:RRR. No murmurs, rubs,  gallops. Gastrointestinal system: Abdomen is nondistended, soft and nontender. No organomegaly or masses felt. Normal bowel sounds heard. Central nervous system: Alert and oriented. No focal neurological deficits. Extremities: No C/C/E, +pedal pulses Skin: No rashes, lesions or ulcers Psychiatry: Judgement and insight appear normal. Mood & affect appropriate.   Data Reviewed: I have personally reviewed following labs and imaging studies  CBC: Recent Labs  Lab 05/10/20 1855 05/11/20 1621 05/12/20 0252 05/13/20 0534  WBC 16.3* 7.8 7.0 4.8  NEUTROABS 15.2* 7.0  --   --   HGB 10.6* 9.8* 9.2* 9.2*  HCT 33.9* 31.3* 30.1* 29.6*  MCV 93.6 92.9 94.7 90.8  PLT 218 184 151 585   Basic Metabolic Panel: Recent Labs  Lab 05/10/20 1855 05/11/20 1621 05/12/20 0252 05/13/20 0534  NA 139 138 135 134*  K 3.2* 2.9* 4.0 3.7  CL 98 98 96* 96*  CO2 28 27 24 25   GLUCOSE 192* 186* 267* 339*  BUN 44* 45* 50* 60*  CREATININE 3.61* 3.73* 3.90* 3.37*  CALCIUM 7.4* 7.4* 7.2* 7.5*  MG  --   --   --  1.5*   GFR: Estimated Creatinine Clearance: 28.8 mL/min (A) (by C-G formula based on SCr of 3.37 mg/dL (H)). Liver Function Tests: Recent Labs  Lab 05/10/20 1855 05/11/20 1621 05/12/20 0252 05/13/20 0534  AST 54* 44* 37 51*  ALT 59* 48* 46* 46*  ALKPHOS 163* 155* 150* 141*  BILITOT 3.2* 3.9* 4.1* 2.2*  PROT 7.3 6.8 6.5 6.6  ALBUMIN 2.7* 2.5* 2.4* 2.5*   No results for input(s): LIPASE, AMYLASE in the last 168 hours. No results for input(s): AMMONIA in the last 168 hours. Coagulation Profile: Recent Labs  Lab 05/10/20 1855 05/11/20 1621  INR 1.3* 1.3*   Cardiac Enzymes: No results for input(s): CKTOTAL, CKMB, CKMBINDEX, TROPONINI in the last 168 hours. BNP (last 3 results) No results for input(s): PROBNP in the last 8760 hours. HbA1C: Recent Labs    05/12/20 0252  HGBA1C 6.5*   CBG: Recent Labs  Lab 05/12/20 2107 05/13/20 0758 05/13/20 1145  GLUCAP 372* 300* 276*   Lipid  Profile: No results for input(s): CHOL, HDL, LDLCALC, TRIG, CHOLHDL, LDLDIRECT in the last 72 hours. Thyroid Function Tests: No results for input(s): TSH, T4TOTAL, FREET4, T3FREE, THYROIDAB in the last 72 hours. Anemia Panel: No results for input(s): VITAMINB12, FOLATE, FERRITIN, TIBC, IRON, RETICCTPCT in the last 72 hours. Sepsis Labs: Recent Labs  Lab 05/10/20 1855 05/10/20 2048 05/11/20 1621 05/11/20 1815 05/11/20 2112 05/12/20 0252 05/13/20 0534  PROCALCITON  --   --   --  17.24  --  29.13 33.50  LATICACIDVEN 1.5 1.0 0.9  --  1.5  --   --     Recent Results (from the past 240 hour(s))  Resp Panel by RT-PCR (Flu A&B, Covid) Nasopharyngeal Swab     Status: None   Collection Time: 05/10/20  6:55 PM   Specimen: Nasopharyngeal Swab; Nasopharyngeal(NP) swabs in vial transport medium  Result Value Ref Range Status   SARS Coronavirus  2 by RT PCR NEGATIVE NEGATIVE Final    Comment: (NOTE) SARS-CoV-2 target nucleic acids are NOT DETECTED.  The SARS-CoV-2 RNA is generally detectable in upper respiratory specimens during the acute phase of infection. The lowest concentration of SARS-CoV-2 viral copies this assay can detect is 138 copies/mL. A negative result does not preclude SARS-Cov-2 infection and should not be used as the sole basis for treatment or other patient management decisions. A negative result may occur with  improper specimen collection/handling, submission of specimen other than nasopharyngeal swab, presence of viral mutation(s) within the areas targeted by this assay, and inadequate number of viral copies(<138 copies/mL). A negative result must be combined with clinical observations, patient history, and epidemiological information. The expected result is Negative.  Fact Sheet for Patients:  EntrepreneurPulse.com.au  Fact Sheet for Healthcare Providers:  IncredibleEmployment.be  This test is no t yet approved or cleared by the  [place] FDA and  has been authorized for detection and/or diagnosis of SARS-CoV-2 by FDA under an Emergency Use Authorization (EUA). This EUA will remain  in effect (meaning this test can be used) for the duration of the COVID-19 declaration under Section 564(b)(1) of the Act, 21 U.S.C.section 360bbb-3(b)(1), unless the authorization is terminated  or revoked sooner.       Influenza A by PCR NEGATIVE NEGATIVE Final   Influenza B by PCR NEGATIVE NEGATIVE Final    Comment: (NOTE) The Xpert Xpress SARS-CoV-2/FLU/RSV plus assay is intended as an aid in the diagnosis of influenza from Nasopharyngeal swab specimens and should not be used as a sole basis for treatment. Nasal washings and aspirates are unacceptable for Xpert Xpress SARS-CoV-2/FLU/RSV testing.  Fact Sheet for Patients: EntrepreneurPulse.com.au  Fact Sheet for Healthcare Providers: IncredibleEmployment.be  This test is not yet approved or cleared by the [place] FDA and has been authorized for detection and/or diagnosis of SARS-CoV-2 by FDA under an Emergency Use Authorization (EUA). This EUA will remain in effect (meaning this test can be used) for the duration of the COVID-19 declaration under Section 564(b)(1) of the Act, 21 U.S.C. section 360bbb-3(b)(1), unless the authorization is terminated or revoked.  Performed at [facility], [address], [place], [place] [address]   Culture, blood (Routine x 2)     Status: None (Preliminary result)   Collection Time: 05/10/20  8:42 PM   Specimen: BLOOD RIGHT HAND  Result Value Ref Range Status   Specimen Description BLOOD RIGHT HAND  Final   Special Requests   Final    BOTTLES DRAWN AEROBIC AND ANAEROBIC Blood Culture adequate volume   Culture   Final    NO GROWTH 3 DAYS Performed at [facility], [address], [place], [place] [address]    Report Status PENDING  Incomplete  Culture, blood (Routine x 2)     Status:  Abnormal   Collection Time: 05/10/20  8:48 PM   Specimen: BLOOD RIGHT ARM  Result Value Ref Range Status   Specimen Description   Final    BLOOD RIGHT ARM Performed at [facility], [address], [place], [place] [address]    Special Requests   Final    BOTTLES DRAWN AEROBIC AND ANAEROBIC Blood Culture adequate volume Performed at [facility], [address] [place], [place] [address]    Culture  Setup Time   Final    GRAM NEGATIVE RODS AEROBIC BOTTLE ONLY Gram Stain Report Called to,Read Back By and Verified With: TRAVIS DEVOLT @1212  05/11/20 BY JONES,T [facility] Organism ID to  follow CRITICAL RESULT CALLED TO, READ BACK BY AND VERIFIED WITH: Gabriel Earing RN 05/11/20 at 1831 sk Performed at [facility], [place] [address] [place],  [address]    Culture KLEBSIELLA PNEUMONIAE (A)  Final   Report Status 05/13/2020 FINAL  Final   Organism ID, Bacteria KLEBSIELLA PNEUMONIAE  Final      Susceptibility   Klebsiella pneumoniae - MIC*    AMPICILLIN >=32 RESISTANT Resistant     CEFAZOLIN <=4 SENSITIVE Sensitive     CEFEPIME <=0.12 SENSITIVE Sensitive     CEFTAZIDIME <=1 SENSITIVE Sensitive     CEFTRIAXONE <=0.25 SENSITIVE Sensitive     CIPROFLOXACIN <=0.25 SENSITIVE Sensitive     GENTAMICIN <=1 SENSITIVE Sensitive     IMIPENEM 0.5 SENSITIVE Sensitive     TRIMETH/SULFA <=20 SENSITIVE Sensitive     AMPICILLIN/SULBACTAM 8 SENSITIVE Sensitive     PIP/TAZO <=4 SENSITIVE Sensitive     * KLEBSIELLA PNEUMONIAE  Blood Culture ID Panel (Reflexed)     Status: Abnormal   Collection Time: 05/10/20  8:48 PM  Result Value Ref Range Status   Enterococcus faecalis NOT DETECTED NOT DETECTED Final   Enterococcus Faecium NOT DETECTED NOT DETECTED Final   Listeria monocytogenes NOT DETECTED NOT DETECTED Final   Staphylococcus species NOT DETECTED NOT DETECTED Final   Staphylococcus aureus (BCID) NOT DETECTED NOT DETECTED Final   Staphylococcus epidermidis NOT DETECTED NOT DETECTED Final    Staphylococcus lugdunensis NOT DETECTED NOT DETECTED Final   Streptococcus species NOT DETECTED NOT DETECTED Final   Streptococcus agalactiae NOT DETECTED NOT DETECTED Final   Streptococcus pneumoniae NOT DETECTED NOT DETECTED Final   Streptococcus pyogenes NOT DETECTED NOT DETECTED Final   A.calcoaceticus-baumannii NOT DETECTED NOT DETECTED Final   Bacteroides fragilis NOT DETECTED NOT DETECTED Final   Enterobacterales DETECTED (A) NOT DETECTED Final    Comment: Enterobacterales represent a large order of gram negative bacteria, not a single organism. CRITICAL RESULT CALLED TO, READ BACK BY AND VERIFIED WITH: M WHITE RN 05/11/20 AT 0370 SK     Enterobacter cloacae complex NOT DETECTED NOT DETECTED Final   Escherichia coli NOT DETECTED NOT DETECTED Final   Klebsiella aerogenes NOT DETECTED NOT DETECTED Final   Klebsiella oxytoca NOT DETECTED NOT DETECTED Final   Klebsiella pneumoniae DETECTED (A) NOT DETECTED Final    Comment: CRITICAL RESULT CALLED TO, READ BACK BY AND VERIFIED WITH: M WHITE RN 05/11/20 AT 1831 SK     Proteus species NOT DETECTED NOT DETECTED Final   Salmonella species NOT DETECTED NOT DETECTED Final   Serratia marcescens NOT DETECTED NOT DETECTED Final   Haemophilus influenzae NOT DETECTED NOT DETECTED Final   Neisseria meningitidis NOT DETECTED NOT DETECTED Final   Pseudomonas aeruginosa NOT DETECTED NOT DETECTED Final   Stenotrophomonas maltophilia NOT DETECTED NOT DETECTED Final   Candida albicans NOT DETECTED NOT DETECTED Final   Candida auris NOT DETECTED NOT DETECTED Final   Candida glabrata NOT DETECTED NOT DETECTED Final   Candida krusei NOT DETECTED NOT DETECTED Final   Candida parapsilosis NOT DETECTED NOT DETECTED Final   Candida tropicalis NOT DETECTED NOT DETECTED Final   Cryptococcus neoformans/gattii NOT DETECTED NOT DETECTED Final   CTX-M ESBL NOT DETECTED NOT DETECTED Final   Carbapenem resistance IMP NOT DETECTED NOT DETECTED Final   Carbapenem  resistance KPC NOT DETECTED NOT DETECTED Final   Carbapenem resistance NDM NOT DETECTED NOT DETECTED Final   Carbapenem resist OXA 48 LIKE NOT DETECTED NOT DETECTED Final  Carbapenem resistance VIM NOT DETECTED NOT DETECTED Final    Comment: Performed at [facility], [place] [address] [place], [place] [address]  Resp Panel by RT-PCR (Flu A&B, Covid) Nasopharyngeal Swab     Status: None   Collection Time: 05/11/20  6:20 PM   Specimen: Nasopharyngeal Swab; Nasopharyngeal(NP) swabs in vial transport medium  Result Value Ref Range Status   SARS Coronavirus 2 by RT PCR NEGATIVE NEGATIVE Final    Comment: (NOTE) SARS-CoV-2 target nucleic acids are NOT DETECTED.  The SARS-CoV-2 RNA is generally detectable in upper respiratory specimens during the acute phase of infection. The lowest concentration of SARS-CoV-2 viral copies this assay can detect is 138 copies/mL. A negative result does not preclude SARS-Cov-2 infection and should not be used as the sole basis for treatment or other patient management decisions. A negative result may occur with  improper specimen collection/handling, submission of specimen other than nasopharyngeal swab, presence of viral mutation(s) within the areas targeted by this assay, and inadequate number of viral copies(<138 copies/mL). A negative result must be combined with clinical observations, patient history, and epidemiological information. The expected result is Negative.  Fact Sheet for Patients:  EntrepreneurPulse.com.au  Fact Sheet for Healthcare Providers:  IncredibleEmployment.be  This test is no t yet approved or cleared by the [place] FDA and  has been authorized for detection and/or diagnosis of SARS-CoV-2 by FDA under an Emergency Use Authorization (EUA). This EUA will remain  in effect (meaning this test can be used) for the duration of the COVID-19 declaration under Section 564(b)(1) of the Act,  21 U.S.C.section 360bbb-3(b)(1), unless the authorization is terminated  or revoked sooner.       Influenza A by PCR NEGATIVE NEGATIVE Final   Influenza B by PCR NEGATIVE NEGATIVE Final    Comment: (NOTE) The Xpert Xpress SARS-CoV-2/FLU/RSV plus assay is intended as an aid in the diagnosis of influenza from Nasopharyngeal swab specimens and should not be used as a sole basis for treatment. Nasal washings and aspirates are unacceptable for Xpert Xpress SARS-CoV-2/FLU/RSV testing.  Fact Sheet for Patients: EntrepreneurPulse.com.au  Fact Sheet for Healthcare Providers: IncredibleEmployment.be  This test is not yet approved or cleared by the [place] FDA and has been authorized for detection and/or diagnosis of SARS-CoV-2 by FDA under an Emergency Use Authorization (EUA). This EUA will remain in effect (meaning this test can be used) for the duration of the COVID-19 declaration under Section 564(b)(1) of the Act, 21 U.S.C. section 360bbb-3(b)(1), unless the authorization is terminated or revoked.  Performed at [facility], [address] [place], [place] [address]   Urine culture     Status: Abnormal   Collection Time: 05/11/20  9:00 PM   Specimen: In/Out Cath Urine  Result Value Ref Range Status   Specimen Description   Final    IN/OUT CATH URINE Performed at [facility], [address], [place], [place] [address]    Special Requests   Final    NONE Performed at [facility], [address], [place], [place] [address]    Culture (A)  Final    <10,000 COLONIES/mL INSIGNIFICANT GROWTH Performed at [place] [address] [place],  [address]    Report Status 05/13/2020 FINAL  Final  Culture, blood (routine x 2)     Status: None (Preliminary result)   Collection Time: 05/11/20  9:05 PM   Specimen: BLOOD LEFT ARM  Result Value Ref Range Status   Specimen Description BLOOD  LEFT ARM  Final   Special Requests   Final     BOTTLES DRAWN AEROBIC AND ANAEROBIC Blood Culture adequate volume   Culture   Final    NO GROWTH 2 DAYS Performed at [facility], [address] [place], [place] [address]    Report Status PENDING  Incomplete  Culture, blood (routine x 2)     Status: None (Preliminary result)   Collection Time: 05/11/20  9:12 PM   Specimen: BLOOD LEFT HAND  Result Value Ref Range Status   Specimen Description BLOOD LEFT HAND  Final   Special Requests   Final    BOTTLES DRAWN AEROBIC AND ANAEROBIC Blood Culture adequate volume   Culture   Final    NO GROWTH 2 DAYS Performed at [facility], [address], [place], [place] [address]    Report Status PENDING  Incomplete  MRSA PCR Screening     Status: None   Collection Time: 05/12/20 11:37 AM   Specimen: Nasal Mucosa; Nasopharyngeal  Result Value Ref Range Status   MRSA by PCR NEGATIVE NEGATIVE Final    Comment:        The GeneXpert MRSA Assay (FDA approved for NASAL specimens only), is one component of a comprehensive MRSA colonization surveillance program. It is not intended to diagnose MRSA infection nor to guide or monitor treatment for MRSA infections. Performed at [facility], [address], [place],  [address]          Radiology Studies: CT ABDOMEN PELVIS WO CONTRAST  Result Date: 05/11/2020 CLINICAL DATA:  69 year old male with history of elevated liver function tests. Cholelithiasis. Generalized weakness, fatigue and diffuse abdominal pain, most severe on the right side for the past 2 days. EXAM: CT ABDOMEN AND PELVIS WITHOUT CONTRAST TECHNIQUE: Multidetector CT imaging of the abdomen and pelvis was performed following the standard protocol without IV contrast. COMPARISON:  CT the abdomen and pelvis 11/23/2016. FINDINGS: Lower chest: Atherosclerotic calcifications in the descending thoracic aorta as well as the right coronary artery. Small amount of pericardial fluid and/or thickening, wound with intermediate attenuation  fluid. No definite pericardial calcification. Hepatobiliary: Diffuse low attenuation, indicative throughout the of hepatic steatosis. Hepatic parenchyma multiple faintly peripherally calcified gallstones measuring up to 2.7 cm in the gallbladder. Gallbladder does not appear overly distended. No pericholecystic fluid or inflammatory changes. Pancreas: No definite pancreatic mass or peripancreatic fluid collections or inflammatory changes are noted on today's noncontrast CT examination. Spleen: Unremarkable. Adrenals/Urinary Tract: Low-attenuation adrenal form thickening in the adrenal glands bilaterally, likely to represent adenomatous hyperplasia. Multiple small renal lesions bilaterally, incompletely characterized on today's noncontrast CT but ranging from low to intermediate attenuation. No hydroureteronephrosis. Urinary bladder is unremarkable in appearance. Stomach/Bowel: Normal appearance of the stomach. No pathologic dilatation of small bowel or colon. Numerous colonic diverticulae are noted, without surrounding inflammatory changes to suggest an acute diverticulitis at this time. Normal appendix. Vascular/Lymphatic: Aortic atherosclerosis. Multiple prominent borderline enlarged and mildly enlarged upper abdominal lymph nodes are noted, measuring up to 1.7 cm in short axis in the portacaval nodal station. Reproductive: Prostate gland and seminal vesicles are unremarkable in appearance. Other: No significant volume of ascites.  No pneumoperitoneum. Musculoskeletal: There are no aggressive appearing lytic or blastic lesions noted in the visualized portions of the skeleton. IMPRESSION: 1. Cholelithiasis without definitive findings to suggest an acute cholecystitis at this time. 2. Colonic diverticulosis without evidence of acute diverticulitis at this time. 3. New intermediate attenuation small amount of pericardial fluid and/or thickening. Clinical  correlation for signs and symptoms of pericarditis is  recommended. 4. Multiple prominent borderline enlarged and mildly enlarged upper abdominal lymph nodes measuring up to 1.7 cm in short axis in the portacaval nodal station, nonspecific, but likely benign (the portacaval lymph node did not demonstrate diffusion restriction on the contemporaneously obtained abdominal MRI). 5. 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 6. Aortic atherosclerosis, in addition to at least right coronary artery disease. Please note that although the presence of coronary artery calcium documents the presence of coronary artery disease, the severity of this disease and any potential stenosis cannot be assessed on this non-gated CT examination. Assessment for potential risk factor modification, dietary therapy or pharmacologic therapy may be warranted, if clinically indicated. 7. Additional incidental findings, as above. Electronically Signed   By: Vinnie Langton M.D.   On: 05/11/2020 20:16   MR ABDOMEN MRCP WO CONTRAST  Result Date: 05/11/2020 CLINICAL DATA:  69 year old male with history of jaundice. Abdominal pain, most severe in the right upper quadrant. Fever, chills and dyspnea. EXAM: MRI ABDOMEN WITHOUT CONTRAST  (INCLUDING MRCP) TECHNIQUE: Multiplanar multisequence MR imaging of the abdomen was performed. Heavily T2-weighted images of the biliary and pancreatic ducts were obtained, and three-dimensional MRCP images were rendered by post processing. COMPARISON:  No priors. FINDINGS: Comment: Today's study is limited for detection and characterization of visceral and/or vascular lesions by lack of IV gadolinium. Lower chest: Unremarkable. Hepatobiliary: No definite suspicious cystic or solid hepatic lesions are confidently identified on today's noncontrast examination. No intra or extrahepatic biliary ductal dilatation. Common bile duct measures 4 mm in the porta hepatis. No filling defect in the common bile duct to suggest choledocholithiasis. Numerous filling  defects are noted within the gallbladder, compatible with gallstones measuring up to 2.4 cm. Gallbladder is only moderately distended. No definite gallbladder wall thickening or pericholecystic fluid or inflammatory changes. Pancreas: No definite pancreatic mass or peripancreatic fluid collections or inflammatory changes. No pancreatic ductal dilatation noted on MRCP images. Spleen:  Unremarkable. Adrenals/Urinary Tract: Multiple T1 hypointense and T2 hyperintense lesions are noted in the kidneys, incompletely characterized on today's noncontrast examination, but statistically likely to represent cysts, measuring up to 2.1 cm in the upper pole the left kidney. No hydroureteronephrosis in the visualized portions of the abdomen. Adrenal form thickening of the adrenal glands bilaterally which appear to lose signal intensity on out of phase dual echo images, indicative of adenomatous hyperplasia. Stomach/Bowel: Visualized portions are unremarkable. Vascular/Lymphatic: No aneurysm identified in the visualized abdominal vasculature. Mildly enlarged portacaval lymph node measuring 1.7 cm in short axis, without diffusion restriction. Other: No significant volume of ascites noted in the visualized portions of the peritoneal cavity. Musculoskeletal: No aggressive appearing osseous lesions are noted in the visualized portions of the skeleton. IMPRESSION: 1. Cholelithiasis without evidence of acute cholecystitis at this time. 2. No choledocholithiasis. No findings of biliary tract obstruction. 3. 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 Additional incidental findings, as above. Electronically Signed   By: Vinnie Langton M.D.   On: 05/11/2020 20:15   MR 3D Recon At Scanner  Result Date: 05/11/2020 CLINICAL DATA:  69 year old male with history of jaundice. Abdominal pain, most severe in the right upper quadrant. Fever, chills and dyspnea. EXAM: MRI ABDOMEN WITHOUT CONTRAST  (INCLUDING MRCP) TECHNIQUE:  Multiplanar multisequence MR imaging of the abdomen was performed. Heavily T2-weighted images of the biliary and pancreatic ducts were obtained, and three-dimensional MRCP images were rendered by post processing. COMPARISON:  No priors. FINDINGS: Comment: Today's  study is limited for detection and characterization of visceral and/or vascular lesions by lack of IV gadolinium. Lower chest: Unremarkable. Hepatobiliary: No definite suspicious cystic or solid hepatic lesions are confidently identified on today's noncontrast examination. No intra or extrahepatic biliary ductal dilatation. Common bile duct measures 4 mm in the porta hepatis. No filling defect in the common bile duct to suggest choledocholithiasis. Numerous filling defects are noted within the gallbladder, compatible with gallstones measuring up to 2.4 cm. Gallbladder is only moderately distended. No definite gallbladder wall thickening or pericholecystic fluid or inflammatory changes. Pancreas: No definite pancreatic mass or peripancreatic fluid collections or inflammatory changes. No pancreatic ductal dilatation noted on MRCP images. Spleen:  Unremarkable. Adrenals/Urinary Tract: Multiple T1 hypointense and T2 hyperintense lesions are noted in the kidneys, incompletely characterized on today's noncontrast examination, but statistically likely to represent cysts, measuring up to 2.1 cm in the upper pole the left kidney. No hydroureteronephrosis in the visualized portions of the abdomen. Adrenal form thickening of the adrenal glands bilaterally which appear to lose signal intensity on out of phase dual echo images, indicative of adenomatous hyperplasia. Stomach/Bowel: Visualized portions are unremarkable. Vascular/Lymphatic: No aneurysm identified in the visualized abdominal vasculature. Mildly enlarged portacaval lymph node measuring 1.7 cm in short axis, without diffusion restriction. Other: No significant volume of ascites noted in the visualized  portions of the peritoneal cavity. Musculoskeletal: No aggressive appearing osseous lesions are noted in the visualized portions of the skeleton. IMPRESSION: 1. Cholelithiasis without evidence of acute cholecystitis at this time. 2. No choledocholithiasis. No findings of biliary tract obstruction. 3. 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 Additional incidental findings, as above. Electronically Signed   By: Vinnie Langton M.D.   On: 05/11/2020 20:15   US Abdomen Limited  Result Date: 05/12/2020 CLINICAL DATA:  Elevated LFTs EXAM: ULTRASOUND ABDOMEN LIMITED RIGHT UPPER QUADRANT COMPARISON:  None. FINDINGS: Gallbladder: 2.6 cm gallstone within the gallbladder. Sludge also noted layering in the gallbladder. No wall thickening or sonographic Murphy's. Common bile duct: Diameter: Normal caliber, 6 mm Liver: No focal lesion identified. Within normal limits in parenchymal echogenicity. Portal vein is patent on color Doppler imaging with normal direction of blood flow towards the liver. Other: None. IMPRESSION: Cholelithiasis and layering sludge within the gallbladder. No sonographic evidence of acute cholecystitis. Electronically Signed   By: Rolm Baptise M.D.   On: 05/12/2020 09:27   DG Chest Port 1 View  Result Date: 05/11/2020 CLINICAL DATA:  Shortness of breath x2 days. EXAM: PORTABLE CHEST 1 VIEW COMPARISON:  May 10, 2020 FINDINGS: Mild, stable diffuse chronic appearing increased interstitial lung markings are seen. There is no evidence of acute infiltrate, pleural effusion or pneumothorax. The cardiac silhouette is markedly enlarged and unchanged in size. There is moderate severity calcification of the aortic arch. Degenerative changes are seen throughout the thoracic spine. IMPRESSION: Stable cardiomegaly without evidence of acute or active cardiopulmonary disease. Electronically Signed   By: Virgina Norfolk M.D.   On: 05/11/2020 17:40        Scheduled Meds: .  allopurinol  100 mg Oral Daily  . amLODipine  10 mg Oral Daily  . aspirin EC  81 mg Oral Q breakfast  . calcitRIOL  0.25 mcg Oral Daily  . Chlorhexidine Gluconate Cloth  6 each Topical Daily  . cholecalciferol  5,000 Units Oral Daily  . FLUoxetine  20 mg Oral Daily  . fluticasone furoate-vilanterol  2 puff Inhalation Daily  . gabapentin  300 mg Oral QHS  .  heparin  5,000 Units Subcutaneous Q8H  . insulin aspart  0-5 Units Subcutaneous QHS  . insulin aspart  0-9 Units Subcutaneous TID WC  . insulin glargine  30 Units Subcutaneous QHS  . ipratropium-albuterol  3 mL Nebulization BID  . metoprolol tartrate  50 mg Oral BID  . multivitamin with minerals  1 tablet Oral Daily  . omega-3 acid ethyl esters  1 g Oral BID  . pantoprazole  40 mg Oral QAC breakfast  . tamsulosin  0.4 mg Oral QPC supper  . cyanocobalamin  1,000 mcg Oral Daily   Continuous Infusions: . cefTRIAXone (ROCEPHIN)  IV 2 g (05/13/20 1234)     LOS: 2 days    Time spent: 8mins    Kathie Dike, MD [facility]   If 7PM-7AM, please contact night-coverage [email or web address]  05/13/2020, 1:24 PM

## 2020-05-13 NOTE — Progress Notes (Signed)
CRITICAL VALUE ALERT  Critical Value:  Positive anaerobic blood culture for gram negative rods  Date & Time Notied:  12/10, 1830  Provider Notified: Dr. Roderic Palau  Orders Received/Actions taken: No new orders

## 2020-05-13 NOTE — Progress Notes (Signed)
Subjective: Still some RUQ dull pain. No N/V. Had a bowel movement last ight and no brbpr or melena (per patient and per nursing staff). No other GI complaints.  Objective: Vital signs in last 24 hours: Temp:  [97.5 F (36.4 C)-97.7 F (36.5 C)] 97.5 F (36.4 C) (12/10 0801) Pulse Rate:  [38-129] 91 (12/10 0900) Resp:  [11-22] 17 (12/10 0800) BP: (125-170)/(45-112) 162/75 (12/10 0900) SpO2:  [96 %-100 %] 100 % (12/10 0800) Weight:  [129.9 kg] 129.9 kg (12/09 1100) Last BM Date: 05/11/20 General:   Alert and oriented, pleasant Head:  Normocephalic and atraumatic. Eyes:  No icterus, sclera clear. Conjuctiva pink.  Neck:  Supple, without thyromegaly or masses.  Heart:  S1, S2 present, no murmurs noted.  Lungs: Clear to auscultation bilaterally, without wheezing, rales, or rhonchi.  Abdomen:  Bowel sounds present, rounded but soft, non-distended. Mild RUQ TTP, not significantly worse with palpation. No HSM or hernias noted. No rebound or guarding. No masses appreciated  Msk:  Symmetrical without gross deformities. Pulses:  Normal bilateral DP pulses noted. Extremities:  Without clubbing or edema. Neurologic:  Alert and  oriented x4;  grossly normal neurologically. Psych:  Alert and cooperative. Normal mood and affect.  Intake/Output from previous day: 12/09 0701 - 12/10 0700 In: -  Out: 875 [Urine:875] Intake/Output this shift: No intake/output data recorded.  Lab Results: Recent Labs    05/11/20 1621 05/12/20 0252 05/13/20 0534  WBC 7.8 7.0 4.8  HGB 9.8* 9.2* 9.2*  HCT 31.3* 30.1* 29.6*  PLT 184 151 153   BMET Recent Labs    05/11/20 1621 05/12/20 0252 05/13/20 0534  NA 138 135 134*  K 2.9* 4.0 3.7  CL 98 96* 96*  CO2 _0 GLUCOSE 186* 267* 339*  BUN 45* 50* 60*  CREATININE 3.73* 3.90* 3.37*  CALCIUM 7.4* 7.2* 7.5*   LFT Recent Labs    05/11/20 1621 05/11/20 1815 05/12/20 0252 05/13/20 0534  PROT 6.8  --  6.5 6.6  ALBUMIN 2.5*  --  2.4*  2.5*  AST 44*  --  37 51*  ALT 48*  --  46* 46*  ALKPHOS 155*  --  150* 141*  BILITOT 3.9*  --  4.1* 2.2*  BILIDIR  --  2.7* 2.7*  --   IBILI  --   --  1.4*  --    PT/INR Recent Labs    05/10/20 1855 05/11/20 1621  LABPROT 15.4* 15.3*  INR 1.3* 1.3*   Hepatitis Panel No results for input(s): HEPBSAG, HCVAB, HEPAIGM, HEPBIGM in the last 72 hours.   Studies/Results: CT ABDOMEN PELVIS WO CONTRAST  Result Date: 05/11/2020 CLINICAL DATA:  69 year old male with history of elevated liver function tests. Cholelithiasis. Generalized weakness, fatigue and diffuse abdominal pain, most severe on the right side for the past 2 days. EXAM: CT ABDOMEN AND PELVIS WITHOUT CONTRAST TECHNIQUE: Multidetector CT imaging of the abdomen and pelvis was performed following the standard protocol without IV contrast. COMPARISON:  CT the abdomen and pelvis 11/23/2016. FINDINGS: Lower chest: Atherosclerotic calcifications in the descending thoracic aorta as well as the right coronary artery. Small amount of pericardial fluid and/or thickening, wound with intermediate attenuation fluid. No definite pericardial calcification. Hepatobiliary: Diffuse low attenuation, indicative throughout the of hepatic steatosis. Hepatic parenchyma multiple faintly peripherally calcified gallstones measuring up to 2.7 cm in the gallbladder. Gallbladder does not appear overly distended. No pericholecystic fluid or inflammatory changes. Pancreas: No definite pancreatic mass or peripancreatic fluid  collections or inflammatory changes are noted on today's noncontrast CT examination. Spleen: Unremarkable. Adrenals/Urinary Tract: Low-attenuation adrenal form thickening in the adrenal glands bilaterally, likely to represent adenomatous hyperplasia. Multiple small renal lesions bilaterally, incompletely characterized on today's noncontrast CT but ranging from low to intermediate attenuation. No hydroureteronephrosis. Urinary bladder is unremarkable  in appearance. Stomach/Bowel: Normal appearance of the stomach. No pathologic dilatation of small bowel or colon. Numerous colonic diverticulae are noted, without surrounding inflammatory changes to suggest an acute diverticulitis at this time. Normal appendix. Vascular/Lymphatic: Aortic atherosclerosis. Multiple prominent borderline enlarged and mildly enlarged upper abdominal lymph nodes are noted, measuring up to 1.7 cm in short axis in the portacaval nodal station. Reproductive: Prostate gland and seminal vesicles are unremarkable in appearance. Other: No significant volume of ascites.  No pneumoperitoneum. Musculoskeletal: There are no aggressive appearing lytic or blastic lesions noted in the visualized portions of the skeleton. IMPRESSION: 1. Cholelithiasis without definitive findings to suggest an acute cholecystitis at this time. 2. Colonic diverticulosis without evidence of acute diverticulitis at this time. 3. New intermediate attenuation small amount of pericardial fluid and/or thickening. Clinical correlation for signs and symptoms of pericarditis is recommended. 4. Multiple prominent borderline enlarged and mildly enlarged upper abdominal lymph nodes measuring up to 1.7 cm in short axis in the portacaval nodal station, nonspecific, but likely benign (the portacaval lymph node did not demonstrate diffusion restriction on the contemporaneously obtained abdominal MRI). 5. 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 6. Aortic atherosclerosis, in addition to at least right coronary artery disease. Please note that although the presence of coronary artery calcium documents the presence of coronary artery disease, the severity of this disease and any potential stenosis cannot be assessed on this non-gated CT examination. Assessment for potential risk factor modification, dietary therapy or pharmacologic therapy may be warranted, if clinically indicated. 7. Additional incidental findings, as  above. Electronically Signed   By: Vinnie Langton M.D.   On: 05/11/2020 20:16   MR ABDOMEN MRCP WO CONTRAST  Result Date: 05/11/2020 CLINICAL DATA:  69 year old male with history of jaundice. Abdominal pain, most severe in the right upper quadrant. Fever, chills and dyspnea. EXAM: MRI ABDOMEN WITHOUT CONTRAST  (INCLUDING MRCP) TECHNIQUE: Multiplanar multisequence MR imaging of the abdomen was performed. Heavily T2-weighted images of the biliary and pancreatic ducts were obtained, and three-dimensional MRCP images were rendered by post processing. COMPARISON:  No priors. FINDINGS: Comment: Today's study is limited for detection and characterization of visceral and/or vascular lesions by lack of IV gadolinium. Lower chest: Unremarkable. Hepatobiliary: No definite suspicious cystic or solid hepatic lesions are confidently identified on today's noncontrast examination. No intra or extrahepatic biliary ductal dilatation. Common bile duct measures 4 mm in the porta hepatis. No filling defect in the common bile duct to suggest choledocholithiasis. Numerous filling defects are noted within the gallbladder, compatible with gallstones measuring up to 2.4 cm. Gallbladder is only moderately distended. No definite gallbladder wall thickening or pericholecystic fluid or inflammatory changes. Pancreas: No definite pancreatic mass or peripancreatic fluid collections or inflammatory changes. No pancreatic ductal dilatation noted on MRCP images. Spleen:  Unremarkable. Adrenals/Urinary Tract: Multiple T1 hypointense and T2 hyperintense lesions are noted in the kidneys, incompletely characterized on today's noncontrast examination, but statistically likely to represent cysts, measuring up to 2.1 cm in the upper pole the left kidney. No hydroureteronephrosis in the visualized portions of the abdomen. Adrenal form thickening of the adrenal glands bilaterally which appear to lose signal intensity on out of phase dual  echo images,  indicative of adenomatous hyperplasia. Stomach/Bowel: Visualized portions are unremarkable. Vascular/Lymphatic: No aneurysm identified in the visualized abdominal vasculature. Mildly enlarged portacaval lymph node measuring 1.7 cm in short axis, without diffusion restriction. Other: No significant volume of ascites noted in the visualized portions of the peritoneal cavity. Musculoskeletal: No aggressive appearing osseous lesions are noted in the visualized portions of the skeleton. IMPRESSION: 1. Cholelithiasis without evidence of acute cholecystitis at this time. 2. No choledocholithiasis. No findings of biliary tract obstruction. 3. 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 Additional incidental findings, as above. Electronically Signed   By: Vinnie Langton M.D.   On: 05/11/2020 20:15   MR 3D Recon At Scanner  Result Date: 05/11/2020 CLINICAL DATA:  69 year old male with history of jaundice. Abdominal pain, most severe in the right upper quadrant. Fever, chills and dyspnea. EXAM: MRI ABDOMEN WITHOUT CONTRAST  (INCLUDING MRCP) TECHNIQUE: Multiplanar multisequence MR imaging of the abdomen was performed. Heavily T2-weighted images of the biliary and pancreatic ducts were obtained, and three-dimensional MRCP images were rendered by post processing. COMPARISON:  No priors. FINDINGS: Comment: Today's study is limited for detection and characterization of visceral and/or vascular lesions by lack of IV gadolinium. Lower chest: Unremarkable. Hepatobiliary: No definite suspicious cystic or solid hepatic lesions are confidently identified on today's noncontrast examination. No intra or extrahepatic biliary ductal dilatation. Common bile duct measures 4 mm in the porta hepatis. No filling defect in the common bile duct to suggest choledocholithiasis. Numerous filling defects are noted within the gallbladder, compatible with gallstones measuring up to 2.4 cm. Gallbladder is only moderately distended.  No definite gallbladder wall thickening or pericholecystic fluid or inflammatory changes. Pancreas: No definite pancreatic mass or peripancreatic fluid collections or inflammatory changes. No pancreatic ductal dilatation noted on MRCP images. Spleen:  Unremarkable. Adrenals/Urinary Tract: Multiple T1 hypointense and T2 hyperintense lesions are noted in the kidneys, incompletely characterized on today's noncontrast examination, but statistically likely to represent cysts, measuring up to 2.1 cm in the upper pole the left kidney. No hydroureteronephrosis in the visualized portions of the abdomen. Adrenal form thickening of the adrenal glands bilaterally which appear to lose signal intensity on out of phase dual echo images, indicative of adenomatous hyperplasia. Stomach/Bowel: Visualized portions are unremarkable. Vascular/Lymphatic: No aneurysm identified in the visualized abdominal vasculature. Mildly enlarged portacaval lymph node measuring 1.7 cm in short axis, without diffusion restriction. Other: No significant volume of ascites noted in the visualized portions of the peritoneal cavity. Musculoskeletal: No aggressive appearing osseous lesions are noted in the visualized portions of the skeleton. IMPRESSION: 1. Cholelithiasis without evidence of acute cholecystitis at this time. 2. No choledocholithiasis. No findings of biliary tract obstruction. 3. 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 Additional incidental findings, as above. Electronically Signed   By: Vinnie Langton M.D.   On: 05/11/2020 20:15   US Abdomen Limited  Result Date: 05/12/2020 CLINICAL DATA:  Elevated LFTs EXAM: ULTRASOUND ABDOMEN LIMITED RIGHT UPPER QUADRANT COMPARISON:  None. FINDINGS: Gallbladder: 2.6 cm gallstone within the gallbladder. Sludge also noted layering in the gallbladder. No wall thickening or sonographic Murphy's. Common bile duct: Diameter: Normal caliber, 6 mm Liver: No focal lesion identified. Within  normal limits in parenchymal echogenicity. Portal vein is patent on color Doppler imaging with normal direction of blood flow towards the liver. Other: None. IMPRESSION: Cholelithiasis and layering sludge within the gallbladder. No sonographic evidence of acute cholecystitis. Electronically Signed   By: Rolm Baptise M.D.   On: 05/12/2020  09:27   DG Chest Port 1 View  Result Date: 05/11/2020 CLINICAL DATA:  Shortness of breath x2 days. EXAM: PORTABLE CHEST 1 VIEW COMPARISON:  May 10, 2020 FINDINGS: Mild, stable diffuse chronic appearing increased interstitial lung markings are seen. There is no evidence of acute infiltrate, pleural effusion or pneumothorax. The cardiac silhouette is markedly enlarged and unchanged in size. There is moderate severity calcification of the aortic arch. Degenerative changes are seen throughout the thoracic spine. IMPRESSION: Stable cardiomegaly without evidence of acute or active cardiopulmonary disease. Electronically Signed   By: Virgina Norfolk M.D.   On: 05/11/2020 17:40    Hepatic Function Latest Ref Rng & Units 05/13/2020 05/12/2020 05/11/2020  Total Protein 6.5 - 8.1 g/dL 6.6 6.5 6.8  Albumin 3.5 - 5.0 g/dL 2.5(L) 2.4(L) 2.5(L)  AST 15 - 41 U/L 51(H) 37 44(H)  ALT 0 - 44 U/L 46(H) 46(H) 48(H)  Alk Phosphatase 38 - 126 U/L 141(H) 150(H) 155(H)  Total Bilirubin 0.3 - 1.2 mg/dL 2.2(H) 4.1(H) 3.9(H)  Bilirubin, Direct 0.0 - 0.2 mg/dL - 2.7(H) 2.7(H)      Assessment: Pleasant 69 year old gentleman with multiple comorbidities presenting for ongoing right upper quadrant pain, positive blood cultures.  Right upper quadrant pain: Started 3 days ago, colicky in nature. Noted to have newly elevated LFTs, now improving as above (bili peak 4.2 -> 2.2 today; alkaline phosphatase peak 163 -> 141 today). MRCP/MRI abdomen without contrast shows cholelithiasis but no evidence of choledocholithiasis or cholecystitis.  Common bile duct diameter 4 mm but 6 mm (normal) on RUQ  U/S.  Gallbladder moderately distended.  Noncontrast CT again with cholelithiasis, no evidence of acute cholecystitis.  Gallbladder only mildly distended.  Patient has been afebrile the last 24 hours.  Blood cultures growing Enterobacteres, Klebsiella pneumonia.  Urine culture with insignificant growth.  Clinical picture suggestive of mild cholangitis possibly related to recent impacted or passed stone or Mirizzi Syndrome. Surgery consulted and plans to evaluate today. Clinically he is stable/improved today  Normocytic anemia: Stable.  Likely anemia of chronic disease.   Plan: 1. Continue antibiotics 2. Trend LFTs 3. Supportive measures   Thank you for allowing [place] to participate in the care of Power, DNP, AGNP-C Adult & Gerontological Nurse Practitioner [facility]    LOS: 2 days    05/13/2020, 9:16 AM

## 2020-05-13 NOTE — Progress Notes (Signed)
Inpatient Diabetes Program Recommendations  AACE/ADA: New Consensus Statement on Inpatient Glycemic Control (2015)  Target Ranges:  Prepandial:   less than 140 mg/dL      Peak postprandial:   less than 180 mg/dL (1-2 hours)      Critically ill patients:  140 - 180 mg/dL   Lab Results  Component Value Date   GLUCAP 300 (H) 05/13/2020   HGBA1C 6.5 (H) 05/12/2020    Review of Glycemic Control Results for Eric Scott, Eric Scott (MRN [id number]) as of 05/13/2020 11:46  Ref. Range 05/12/2020 21:07 05/13/2020 07:58  Glucose-Capillary Latest Ref Range: 70 - 99 mg/dL 372 (H) 300 (H)   Diabetes history: DM2 Outpatient Diabetes medications: Lantus 32 + + Novolog 2-14 units tid meal coverage Current orders for Inpatient glycemic control: Lantus 30 units + Novolog sensitive correction tid + hs 0-5 units  Inpatient Diabetes Program Recommendations:   Noted patient last  received steroids 40 mg 12/10  -Increase Lantus to home dose of 32 units daily -Add Novolog 3 units tid meal coverage if eats 50% Secure chat sent to Dr. Roderic Palau.  Thank you, Nani Gasser. Vaniah Chambers, RN, MSN, CDE  Diabetes Coordinator Inpatient Glycemic Control Team Team Pager [phone] (8am-5pm) 05/13/2020 11:50 AM

## 2020-05-13 NOTE — Consult Note (Signed)
Reason for Consult: Cholelithiasis, cholangitis Referring Physician: Dr. Tora Kindred Thursby is an 69 y.o. male.  HPI: Patient is a 69 year old white male with multiple medical problems who presented from a skilled nursing unit with right upper quadrant abdominal pain who was found on initial work-up in the emergency room to have positive blood cultures.  He came back to the emergency room and his liver enzyme tests were noted to be elevated with a total bilirubin of 3.9.  He was admitted to the intensive care unit for further management treatment.  He was started on IV antibiotics.  Further work-up revealed cholelithiasis without evidence of acute cholecystitis.  The biliary tract was without choledocholithiasis.  It is felt that he may have passed a stone or had episodes of Mirizzi syndrome.  Since that time, his liver enzyme tests have been normalizing and he has been feeling better.  He does not have right upper quadrant abdominal pain at the present time.  His white blood cell count has normalized.  Past Medical History:  Diagnosis Date  . Anemia   . Anxiety   . Chronic pain    legs, back; MRI 05/2012 with mild thoracic degenerative changes no spinal stenosis   . 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 COPD (chronic obstructive pulmonary disease) ([place])   . COVID-19   . Depression   . Diabetic peripheral neuropathy ([place])    "chronic" (05/27/2012)  . Diastolic CHF ([place])   . Duodenal ulcer hemorrhage   . DVT (deep venous thrombosis) ([name])    right upper arm  . GERD (gastroesophageal reflux disease)   . Hypercholesteremia   . Hypertension   . Iron deficiency anemia   . 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 Peripheral edema   . PVD (peripheral vascular disease) ([place])   . Renal disorder   . Spinal stenosis    mild lumbar (MRI 05/2012)-L2-L3 to L4-L5 , mild lumbar foraminal stenosis   . Stroke ([place]) 05/2012   Subacute, lacunar infarcts within the left basal ganglia and  posterior limp of the left internal capsule/thalamus; "RUE; both feet weak" (05/27/2012)  . Tremor   . Type II diabetes mellitus ([place])     Past Surgical History:  Procedure Laterality Date  . ANKLE SURGERY    . ESOPHAGOGASTRODUODENOSCOPY (EGD) WITH PROPOFOL N/A 12/21/2016   [place]k: Ulcerative reflux esophagitis, erosive gastropathy, extensive duodenal ulceration likely site of bleeding.  Pathology with mild gastritis, no H. pylori  . ESOPHAGOGASTRODUODENOSCOPY (EGD) WITH PROPOFOL N/A 04/02/2017   Fields: ESOPAHGITIS/GASTRIC AND DUODENAL ULCERS HEALED. mild duodenitis  . HERNIA REPAIR  01/05/2004   "belly button" (05/27/2012)    Family History  Problem Relation Age of Onset  . Colon cancer Neg Hx     Social History:  reports that he quit smoking about 5 months ago. His smoking use included cigarettes. He has a 108.00 pack-year smoking history. He has never used smokeless tobacco. He reports that he does not drink alcohol and does not use drugs.  Allergies:  Allergies  Allergen Reactions  . Blueberry Flavor Other (See Comments)    Unknown  . Cucumber Extract Other (See Comments)    "Fells like I'm having a heart attack"  . Flexeril [Cyclobenzaprine Hcl] Other (See Comments)    "whole body  Tremors" (05/27/2012)  . Kiwi Extract Other (See Comments)    "feels like I'm having a heart attack"    Medications: I have reviewed the patient's current medications.  Results for orders placed or performed during the hospital encounter of 05/11/20 (from the past 48 hour(s))  Lactic acid, plasma     Status: None   Collection Time: 05/11/20  4:21 PM  Result Value Ref Range   Lactic Acid, Venous 0.9 0.5 - 1.9 mmol/L    Comment: Performed at [facility], [address] [place], [place] [address]  Comprehensive metabolic panel     Status: Abnormal   Collection Time: 05/11/20  4:21 PM  Result Value Ref Range   Sodium 138 135 - 145 mmol/L   Potassium 2.9 (L) 3.5 - 5.1 mmol/L   Chloride 98 98  - 111 mmol/L   CO2 27 22 - 32 mmol/L   Glucose, Bld 186 (H) 70 - 99 mg/dL    Comment: Glucose reference range applies only to samples taken after fasting for at least 8 hours.   BUN 45 (H) 8 - 23 mg/dL   Creatinine, Ser 3.73 (H) 0.61 - 1.24 mg/dL   Calcium 7.4 (L) 8.9 - 10.3 mg/dL   Total Protein 6.8 6.5 - 8.1 g/dL   Albumin 2.5 (L) 3.5 - 5.0 g/dL   AST 44 (H) 15 - 41 U/L   ALT 48 (H) 0 - 44 U/L   Alkaline Phosphatase 155 (H) 38 - 126 U/L   Total Bilirubin 3.9 (H) 0.3 - 1.2 mg/dL   GFR, Estimated 17 (L) >60 mL/min    Comment: (NOTE) Calculated using the CKD-EPI Creatinine Equation (2021)    Anion gap 13 5 - 15    Comment: Performed at [facility], [address] [place], [place] [address]  CBC WITH DIFFERENTIAL     Status: Abnormal   Collection Time: 05/11/20  4:21 PM  Result Value Ref Range   WBC 7.8 4.0 - 10.5 K/uL   RBC 3.37 (L) 4.22 - 5.81 MIL/uL   Hemoglobin 9.8 (L) 13.0 - 17.0 g/dL   HCT 31.3 (L) 39.0 - 52.0 %   MCV 92.9 80.0 - 100.0 fL   MCH 29.1 26.0 - 34.0 pg   MCHC 31.3 30.0 - 36.0 g/dL   RDW 16.1 (H) 11.5 - 15.5 %   Platelets 184 150 - 400 K/uL   nRBC 0.0 0.0 - 0.2 %   Neutrophils Relative % 89 %   Neutro Abs 7.0 1.7 - 7.7 K/uL   Lymphocytes Relative 5 %   Lymphs Abs 0.4 (L) 0.7 - 4.0 K/uL   Monocytes Relative 5 %   Monocytes Absolute 0.4 0.1 - 1.0 K/uL   Eosinophils Relative 0 %   Eosinophils Absolute 0.0 0.0 - 0.5 K/uL   Basophils Relative 0 %   Basophils Absolute 0.0 0.0 - 0.1 K/uL   Immature Granulocytes 1 %   Abs Immature Granulocytes 0.06 0.00 - 0.07 K/uL    Comment: Performed at [facility], [address], [place], [place] [address]  Protime-INR     Status: Abnormal   Collection Time: 05/11/20  4:21 PM  Result Value Ref Range   Prothrombin Time 15.3 (H) 11.4 - 15.2 seconds   INR 1.3 (H) 0.8 - 1.2    Comment: (NOTE) INR goal varies based on device and disease states. Performed at [facility], [address], [place], [place] [address]   APTT      Status: None   Collection Time: 05/11/20  4:21 PM  Result Value Ref Range   aPTT 33 24 - 36 seconds    Comment: Performed at [facility], [place]  [address] [place], [place] [address]  Procalcitonin - Baseline     Status: None   Collection Time: 05/11/20  6:15 PM  Result Value Ref Range   Procalcitonin 17.24 ng/mL    Comment:        Interpretation: PCT >= 10 ng/mL: Important systemic inflammatory response, almost exclusively due to severe bacterial sepsis or septic shock. (NOTE)       Sepsis PCT Algorithm           Lower Respiratory Tract                                      Infection PCT Algorithm    ----------------------------     ----------------------------         PCT < 0.25 ng/mL                PCT < 0.10 ng/mL          Strongly encourage             Strongly discourage   discontinuation of antibiotics    initiation of antibiotics    ----------------------------     -----------------------------       PCT 0.25 - 0.50 ng/mL            PCT 0.10 - 0.25 ng/mL               OR       >80% decrease in PCT            Discourage initiation of                                            antibiotics      Encourage discontinuation           of antibiotics    ----------------------------     -----------------------------         PCT >= 0.50 ng/mL              PCT 0.26 - 0.50 ng/mL                AND       <80% decrease in PCT             Encourage initiation of                                             antibiotics       Encourage continuation           of antibiotics    ----------------------------     -----------------------------        PCT >= 0.50 ng/mL                  PCT > 0.50 ng/mL               AND         increase in PCT                  Strongly encourage  initiation of antibiotics    Strongly encourage escalation           of antibiotics                                     -----------------------------                                            PCT <= 0.25 ng/mL                                                 OR                                        > 80% decrease in PCT                                      Discontinue / Do not initiate                                             antibiotics  Performed at [facility], [address], [place], [place] [address]   Bilirubin, direct     Status: Abnormal   Collection Time: 05/11/20  6:15 PM  Result Value Ref Range   Bilirubin, Direct 2.7 (H) 0.0 - 0.2 mg/dL    Comment: Performed at [facility], [address], [place], [place] [address]  Resp Panel by RT-PCR (Flu A&B, Covid) Nasopharyngeal Swab     Status: None   Collection Time: 05/11/20  6:20 PM   Specimen: Nasopharyngeal Swab; Nasopharyngeal(NP) swabs in vial transport medium  Result Value Ref Range   SARS Coronavirus 2 by RT PCR NEGATIVE NEGATIVE    Comment: (NOTE) SARS-CoV-2 target nucleic acids are NOT DETECTED.  The SARS-CoV-2 RNA is generally detectable in upper respiratory specimens during the acute phase of infection. The lowest concentration of SARS-CoV-2 viral copies this assay can detect is 138 copies/mL. A negative result does not preclude SARS-Cov-2 infection and should not be used as the sole basis for treatment or other patient management decisions. A negative result may occur with  improper specimen collection/handling, submission of specimen other than nasopharyngeal swab, presence of viral mutation(s) within the areas targeted by this assay, and inadequate number of viral copies(<138 copies/mL). A negative result must be combined with clinical observations, patient history, and epidemiological information. The expected result is Negative.  Fact Sheet for Patients:  EntrepreneurPulse.com.au  Fact Sheet for Healthcare Providers:  IncredibleEmployment.be  This test is no t yet approved or cleared by the [place] FDA and  has been authorized for  detection and/or diagnosis of SARS-CoV-2 by FDA under an Emergency Use Authorization (EUA). This EUA will remain  in effect (meaning this test can be used) for the duration of the COVID-19 declaration under Section 564(b)(1) of the Act, 21 U.S.C.section 360bbb-3(b)(1), unless the authorization is terminated  or revoked sooner.       Influenza A by PCR NEGATIVE NEGATIVE   Influenza B by PCR NEGATIVE NEGATIVE    Comment: (NOTE) The Xpert Xpress SARS-CoV-2/FLU/RSV plus assay is intended as an aid in the diagnosis of influenza from Nasopharyngeal swab specimens and should not be used as a sole basis for treatment. Nasal washings and aspirates are unacceptable for Xpert Xpress SARS-CoV-2/FLU/RSV testing.  Fact Sheet for Patients: EntrepreneurPulse.com.au  Fact Sheet for Healthcare Providers: IncredibleEmployment.be  This test is not yet approved or cleared by the [place] FDA and has been authorized for detection and/or diagnosis of SARS-CoV-2 by FDA under an Emergency Use Authorization (EUA). This EUA will remain in effect (meaning this test can be used) for the duration of the COVID-19 declaration under Section 564(b)(1) of the Act, 21 U.S.C. section 360bbb-3(b)(1), unless the authorization is terminated or revoked.  Performed at [facility], [address], [place], [place] [address]   Urinalysis, Routine w reflex microscopic Urine, Clean Catch     Status: Abnormal   Collection Time: 05/11/20  9:00 PM  Result Value Ref Range   Color, Urine YELLOW YELLOW   APPearance HAZY (A) CLEAR   Specific Gravity, Urine 1.012 1.005 - 1.030   pH 5.0 5.0 - 8.0   Glucose, UA NEGATIVE NEGATIVE mg/dL   Hgb urine dipstick NEGATIVE NEGATIVE   Bilirubin Urine NEGATIVE NEGATIVE   Ketones, ur NEGATIVE NEGATIVE mg/dL   Protein, ur 30 (A) NEGATIVE mg/dL   Nitrite NEGATIVE NEGATIVE   Leukocytes,Ua MODERATE (A) NEGATIVE   RBC / HPF 0-5 0 - 5 RBC/hpf   WBC,  UA 21-50 0 - 5 WBC/hpf   Bacteria, UA FEW (A) NONE SEEN   Mucus PRESENT    Budding Yeast PRESENT     Comment: Performed at [name], [address], [place], [place] [address]  Urine culture     Status: Abnormal   Collection Time: 05/11/20  9:00 PM   Specimen: In/Out Cath Urine  Result Value Ref Range   Specimen Description      IN/OUT CATH URINE Performed at [facility], [address] [place], [place] [address]    Special Requests      NONE Performed at [facility], [address], [place], [place] [address]    Culture (A)     <10,000 COLONIES/mL INSIGNIFICANT GROWTH Performed at [facility] [address], [place], [place] [address]    Report Status 05/13/2020 FINAL   Culture, blood (routine x 2)     Status: None (Preliminary result)   Collection Time: 05/11/20  9:05 PM   Specimen: BLOOD LEFT ARM  Result Value Ref Range   Specimen Description BLOOD LEFT ARM    Special Requests      BOTTLES DRAWN AEROBIC AND ANAEROBIC Blood Culture adequate volume   Culture      NO GROWTH 2 DAYS Performed at [facility], [address] [place], [place] [address]    Report Status PENDING   Lactic acid, plasma     Status: None   Collection Time: 05/11/20  9:12 PM  Result Value Ref Range   Lactic Acid, Venous 1.5 0.5 - 1.9 mmol/L    Comment: Performed at [facility], [address] [place], [place] [address]  Culture, blood (routine x 2)     Status: None (Preliminary result)   Collection Time: 05/11/20  9:12 PM   Specimen: BLOOD LEFT HAND  Result Value Ref Range   Specimen  Description BLOOD LEFT HAND    Special Requests      BOTTLES DRAWN AEROBIC AND ANAEROBIC Blood Culture adequate volume   Culture      NO GROWTH 2 DAYS Performed at [facility], [address], [place], [place] [address]    Report Status PENDING   Procalcitonin     Status: None   Collection Time: 05/12/20  2:52 AM  Result Value Ref Range   Procalcitonin 29.13 ng/mL    Comment:         Interpretation: PCT >= 10 ng/mL: Important systemic inflammatory response, almost exclusively due to severe bacterial sepsis or septic shock. (NOTE)       Sepsis PCT Algorithm           Lower Respiratory Tract                                      Infection PCT Algorithm    ----------------------------     ----------------------------         PCT < 0.25 ng/mL                PCT < 0.10 ng/mL          Strongly encourage             Strongly discourage   discontinuation of antibiotics    initiation of antibiotics    ----------------------------     -----------------------------       PCT 0.25 - 0.50 ng/mL            PCT 0.10 - 0.25 ng/mL               OR       >80% decrease in PCT            Discourage initiation of                                            antibiotics      Encourage discontinuation           of antibiotics    ----------------------------     -----------------------------         PCT >= 0.50 ng/mL              PCT 0.26 - 0.50 ng/mL                AND       <80% decrease in PCT             Encourage initiation of                                             antibiotics       Encourage continuation           of antibiotics    ----------------------------     -----------------------------        PCT >= 0.50 ng/mL                  PCT > 0.50 ng/mL               AND         increase in PCT  Strongly encourage                                      initiation of antibiotics    Strongly encourage escalation           of antibiotics                                     -----------------------------                                           PCT <= 0.25 ng/mL                                                 OR                                        > 80% decrease in PCT                                      Discontinue / Do not initiate                                             antibiotics  Performed at [facility], [address] [place], [place]  [address]   Hepatic function panel     Status: Abnormal   Collection Time: 05/12/20  2:52 AM  Result Value Ref Range   Total Protein 6.5 6.5 - 8.1 g/dL   Albumin 2.4 (L) 3.5 - 5.0 g/dL   AST 37 15 - 41 U/L   ALT 46 (H) 0 - 44 U/L   Alkaline Phosphatase 150 (H) 38 - 126 U/L   Total Bilirubin 4.1 (H) 0.3 - 1.2 mg/dL   Bilirubin, Direct 2.7 (H) 0.0 - 0.2 mg/dL   Indirect Bilirubin 1.4 (H) 0.3 - 0.9 mg/dL    Comment: Performed at [facility], [address] [place], [place] [address]  CBC     Status: Abnormal   Collection Time: 05/12/20  2:52 AM  Result Value Ref Range   WBC 7.0 4.0 - 10.5 K/uL   RBC 3.18 (L) 4.22 - 5.81 MIL/uL   Hemoglobin 9.2 (L) 13.0 - 17.0 g/dL   HCT 30.1 (L) 39.0 - 52.0 %   MCV 94.7 80.0 - 100.0 fL   MCH 28.9 26.0 - 34.0 pg   MCHC 30.6 30.0 - 36.0 g/dL   RDW 16.1 (H) 11.5 - 15.5 %   Platelets 151 150 - 400 K/uL   nRBC 0.0 0.0 - 0.2 %    Comment: Performed at [facility], [address] [place], [place] [address]  Basic metabolic panel     Status: Abnormal   Collection Time: 05/12/20  2:52 AM  Result Value Ref Range   Sodium 135 135 - 145  mmol/L   Potassium 4.0 3.5 - 5.1 mmol/L    Comment: DELTA CHECK NOTED   Chloride 96 (L) 98 - 111 mmol/L   CO2 24 22 - 32 mmol/L   Glucose, Bld 267 (H) 70 - 99 mg/dL    Comment: Glucose reference range applies only to samples taken after fasting for at least 8 hours.   BUN 50 (H) 8 - 23 mg/dL   Creatinine, Ser 3.90 (H) 0.61 - 1.24 mg/dL   Calcium 7.2 (L) 8.9 - 10.3 mg/dL   GFR, Estimated 16 (L) >60 mL/min    Comment: (NOTE) Calculated using the CKD-EPI Creatinine Equation (2021)    Anion gap 15 5 - 15    Comment: Performed at [facility], [address] [place], [place] [address]  Hemoglobin A1c     Status: Abnormal   Collection Time: 05/12/20  2:52 AM  Result Value Ref Range   Hgb A1c MFr Bld 6.5 (H) 4.8 - 5.6 %    Comment: (NOTE) Pre diabetes:          5.7%-6.4%  Diabetes:              >6.4%  Glycemic control  for   <7.0% adults with diabetes    Mean Plasma Glucose 139.85 mg/dL    Comment: Performed at [facility] [address], [place], [place] [address]  MRSA PCR Screening     Status: None   Collection Time: 05/12/20 11:37 AM   Specimen: Nasal Mucosa; Nasopharyngeal  Result Value Ref Range   MRSA by PCR NEGATIVE NEGATIVE    Comment:        The GeneXpert MRSA Assay (FDA approved for NASAL specimens only), is one component of a comprehensive MRSA colonization surveillance program. It is not intended to diagnose MRSA infection nor to guide or monitor treatment for MRSA infections. Performed at [facility], [address] [place], [place] [address]   Glucose, capillary     Status: Abnormal   Collection Time: 05/12/20  9:07 PM  Result Value Ref Range   Glucose-Capillary 372 (H) 70 - 99 mg/dL    Comment: Glucose reference range applies only to samples taken after fasting for at least 8 hours.  Procalcitonin     Status: None   Collection Time: 05/13/20  5:34 AM  Result Value Ref Range   Procalcitonin 33.50 ng/mL    Comment:        Interpretation: PCT >= 10 ng/mL: Important systemic inflammatory response, almost exclusively due to severe bacterial sepsis or septic shock. (NOTE)       Sepsis PCT Algorithm           Lower Respiratory Tract                                      Infection PCT Algorithm    ----------------------------     ----------------------------         PCT < 0.25 ng/mL                PCT < 0.10 ng/mL          Strongly encourage             Strongly discourage   discontinuation of antibiotics    initiation of antibiotics    ----------------------------     -----------------------------       PCT 0.25 - 0.50 ng/mL  PCT 0.10 - 0.25 ng/mL               OR       >80% decrease in PCT            Discourage initiation of                                            antibiotics      Encourage discontinuation           of antibiotics     ----------------------------     -----------------------------         PCT >= 0.50 ng/mL              PCT 0.26 - 0.50 ng/mL                AND       <80% decrease in PCT             Encourage initiation of                                             antibiotics       Encourage continuation           of antibiotics    ----------------------------     -----------------------------        PCT >= 0.50 ng/mL                  PCT > 0.50 ng/mL               AND         increase in PCT                  Strongly encourage                                      initiation of antibiotics    Strongly encourage escalation           of antibiotics                                     -----------------------------                                           PCT <= 0.25 ng/mL                                                 OR                                        > 80% decrease in PCT  Discontinue / Do not initiate                                             antibiotics  Performed at [facility], [address] [place], [place] [address]   Comprehensive metabolic panel     Status: Abnormal   Collection Time: 05/13/20  5:34 AM  Result Value Ref Range   Sodium 134 (L) 135 - 145 mmol/L   Potassium 3.7 3.5 - 5.1 mmol/L   Chloride 96 (L) 98 - 111 mmol/L   CO2 25 22 - 32 mmol/L   Glucose, Bld 339 (H) 70 - 99 mg/dL    Comment: Glucose reference range applies only to samples taken after fasting for at least 8 hours.   BUN 60 (H) 8 - 23 mg/dL   Creatinine, Ser 3.37 (H) 0.61 - 1.24 mg/dL   Calcium 7.5 (L) 8.9 - 10.3 mg/dL   Total Protein 6.6 6.5 - 8.1 g/dL   Albumin 2.5 (L) 3.5 - 5.0 g/dL   AST 51 (H) 15 - 41 U/L   ALT 46 (H) 0 - 44 U/L   Alkaline Phosphatase 141 (H) 38 - 126 U/L   Total Bilirubin 2.2 (H) 0.3 - 1.2 mg/dL   GFR, Estimated 19 (L) >60 mL/min    Comment: (NOTE) Calculated using the CKD-EPI Creatinine Equation (2021)    Anion gap 13 5 - 15    Comment:  Performed at [facility], [address] [place], [place] [address]  CBC     Status: Abnormal   Collection Time: 05/13/20  5:34 AM  Result Value Ref Range   WBC 4.8 4.0 - 10.5 K/uL   RBC 3.26 (L) 4.22 - 5.81 MIL/uL   Hemoglobin 9.2 (L) 13.0 - 17.0 g/dL   HCT 29.6 (L) 39.0 - 52.0 %   MCV 90.8 80.0 - 100.0 fL   MCH 28.2 26.0 - 34.0 pg   MCHC 31.1 30.0 - 36.0 g/dL   RDW 15.8 (H) 11.5 - 15.5 %   Platelets 153 150 - 400 K/uL   nRBC 0.0 0.0 - 0.2 %    Comment: Performed at [facility], [address] [place], [place] [address]  Magnesium     Status: Abnormal   Collection Time: 05/13/20  5:34 AM  Result Value Ref Range   Magnesium 1.5 (L) 1.7 - 2.4 mg/dL    Comment: Performed at [facility], [address] [place], [place] [address]  Glucose, capillary     Status: Abnormal   Collection Time: 05/13/20  7:58 AM  Result Value Ref Range   Glucose-Capillary 300 (H) 70 - 99 mg/dL    Comment: Glucose reference range applies only to samples taken after fasting for at least 8 hours.   Comment 1 Notify RN   Glucose, capillary     Status: Abnormal   Collection Time: 05/13/20 11:45 AM  Result Value Ref Range   Glucose-Capillary 276 (H) 70 - 99 mg/dL    Comment: Glucose reference range applies only to samples taken after fasting for at least 8 hours.    CT ABDOMEN PELVIS WO CONTRAST  Result Date: 05/11/2020 CLINICAL DATA:  69 year old male with history of elevated liver function tests. Cholelithiasis. Generalized weakness, fatigue and diffuse abdominal pain, most severe on the right side for the past 2 days. EXAM: CT ABDOMEN AND PELVIS WITHOUT CONTRAST TECHNIQUE:  Multidetector CT imaging of the abdomen and pelvis was performed following the standard protocol without IV contrast. COMPARISON:  CT the abdomen and pelvis 11/23/2016. FINDINGS: Lower chest: Atherosclerotic calcifications in the descending thoracic aorta as well as the right coronary artery. Small amount of pericardial fluid and/or  thickening, wound with intermediate attenuation fluid. No definite pericardial calcification. Hepatobiliary: Diffuse low attenuation, indicative throughout the of hepatic steatosis. Hepatic parenchyma multiple faintly peripherally calcified gallstones measuring up to 2.7 cm in the gallbladder. Gallbladder does not appear overly distended. No pericholecystic fluid or inflammatory changes. Pancreas: No definite pancreatic mass or peripancreatic fluid collections or inflammatory changes are noted on today's noncontrast CT examination. Spleen: Unremarkable. Adrenals/Urinary Tract: Low-attenuation adrenal form thickening in the adrenal glands bilaterally, likely to represent adenomatous hyperplasia. Multiple small renal lesions bilaterally, incompletely characterized on today's noncontrast CT but ranging from low to intermediate attenuation. No hydroureteronephrosis. Urinary bladder is unremarkable in appearance. Stomach/Bowel: Normal appearance of the stomach. No pathologic dilatation of small bowel or colon. Numerous colonic diverticulae are noted, without surrounding inflammatory changes to suggest an acute diverticulitis at this time. Normal appendix. Vascular/Lymphatic: Aortic atherosclerosis. Multiple prominent borderline enlarged and mildly enlarged upper abdominal lymph nodes are noted, measuring up to 1.7 cm in short axis in the portacaval nodal station. Reproductive: Prostate gland and seminal vesicles are unremarkable in appearance. Other: No significant volume of ascites.  No pneumoperitoneum. Musculoskeletal: There are no aggressive appearing lytic or blastic lesions noted in the visualized portions of the skeleton. IMPRESSION: 1. Cholelithiasis without definitive findings to suggest an acute cholecystitis at this time. 2. Colonic diverticulosis without evidence of acute diverticulitis at this time. 3. New intermediate attenuation small amount of pericardial fluid and/or thickening. Clinical correlation for  signs and symptoms of pericarditis is recommended. 4. Multiple prominent borderline enlarged and mildly enlarged upper abdominal lymph nodes measuring up to 1.7 cm in short axis in the portacaval nodal station, nonspecific, but likely benign (the portacaval lymph node did not demonstrate diffusion restriction on the contemporaneously obtained abdominal MRI). 5. 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 6. Aortic atherosclerosis, in addition to at least right coronary artery disease. Please note that although the presence of coronary artery calcium documents the presence of coronary artery disease, the severity of this disease and any potential stenosis cannot be assessed on this non-gated CT examination. Assessment for potential risk factor modification, dietary therapy or pharmacologic therapy may be warranted, if clinically indicated. 7. Additional incidental findings, as above. Electronically Signed   By: Vinnie Langton M.D.   On: 05/11/2020 20:16   MR ABDOMEN MRCP WO CONTRAST  Result Date: 05/11/2020 CLINICAL DATA:  69 year old male with history of jaundice. Abdominal pain, most severe in the right upper quadrant. Fever, chills and dyspnea. EXAM: MRI ABDOMEN WITHOUT CONTRAST  (INCLUDING MRCP) TECHNIQUE: Multiplanar multisequence MR imaging of the abdomen was performed. Heavily T2-weighted images of the biliary and pancreatic ducts were obtained, and three-dimensional MRCP images were rendered by post processing. COMPARISON:  No priors. FINDINGS: Comment: Today's study is limited for detection and characterization of visceral and/or vascular lesions by lack of IV gadolinium. Lower chest: Unremarkable. Hepatobiliary: No definite suspicious cystic or solid hepatic lesions are confidently identified on today's noncontrast examination. No intra or extrahepatic biliary ductal dilatation. Common bile duct measures 4 mm in the porta hepatis. No filling defect in the common bile duct to suggest  choledocholithiasis. Numerous filling defects are noted within the gallbladder, compatible with gallstones measuring up to 2.4 cm. Gallbladder  is only moderately distended. No definite gallbladder wall thickening or pericholecystic fluid or inflammatory changes. Pancreas: No definite pancreatic mass or peripancreatic fluid collections or inflammatory changes. No pancreatic ductal dilatation noted on MRCP images. Spleen:  Unremarkable. Adrenals/Urinary Tract: Multiple T1 hypointense and T2 hyperintense lesions are noted in the kidneys, incompletely characterized on today's noncontrast examination, but statistically likely to represent cysts, measuring up to 2.1 cm in the upper pole the left kidney. No hydroureteronephrosis in the visualized portions of the abdomen. Adrenal form thickening of the adrenal glands bilaterally which appear to lose signal intensity on out of phase dual echo images, indicative of adenomatous hyperplasia. Stomach/Bowel: Visualized portions are unremarkable. Vascular/Lymphatic: No aneurysm identified in the visualized abdominal vasculature. Mildly enlarged portacaval lymph node measuring 1.7 cm in short axis, without diffusion restriction. Other: No significant volume of ascites noted in the visualized portions of the peritoneal cavity. Musculoskeletal: No aggressive appearing osseous lesions are noted in the visualized portions of the skeleton. IMPRESSION: 1. Cholelithiasis without evidence of acute cholecystitis at this time. 2. No choledocholithiasis. No findings of biliary tract obstruction. 3. 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 Additional incidental findings, as above. Electronically Signed   By: Vinnie Langton M.D.   On: 05/11/2020 20:15   MR 3D Recon At Scanner  Result Date: 05/11/2020 CLINICAL DATA:  69 year old male with history of jaundice. Abdominal pain, most severe in the right upper quadrant. Fever, chills and dyspnea. EXAM: MRI ABDOMEN WITHOUT  CONTRAST  (INCLUDING MRCP) TECHNIQUE: Multiplanar multisequence MR imaging of the abdomen was performed. Heavily T2-weighted images of the biliary and pancreatic ducts were obtained, and three-dimensional MRCP images were rendered by post processing. COMPARISON:  No priors. FINDINGS: Comment: Today's study is limited for detection and characterization of visceral and/or vascular lesions by lack of IV gadolinium. Lower chest: Unremarkable. Hepatobiliary: No definite suspicious cystic or solid hepatic lesions are confidently identified on today's noncontrast examination. No intra or extrahepatic biliary ductal dilatation. Common bile duct measures 4 mm in the porta hepatis. No filling defect in the common bile duct to suggest choledocholithiasis. Numerous filling defects are noted within the gallbladder, compatible with gallstones measuring up to 2.4 cm. Gallbladder is only moderately distended. No definite gallbladder wall thickening or pericholecystic fluid or inflammatory changes. Pancreas: No definite pancreatic mass or peripancreatic fluid collections or inflammatory changes. No pancreatic ductal dilatation noted on MRCP images. Spleen:  Unremarkable. Adrenals/Urinary Tract: Multiple T1 hypointense and T2 hyperintense lesions are noted in the kidneys, incompletely characterized on today's noncontrast examination, but statistically likely to represent cysts, measuring up to 2.1 cm in the upper pole the left kidney. No hydroureteronephrosis in the visualized portions of the abdomen. Adrenal form thickening of the adrenal glands bilaterally which appear to lose signal intensity on out of phase dual echo images, indicative of adenomatous hyperplasia. Stomach/Bowel: Visualized portions are unremarkable. Vascular/Lymphatic: No aneurysm identified in the visualized abdominal vasculature. Mildly enlarged portacaval lymph node measuring 1.7 cm in short axis, without diffusion restriction. Other: No significant volume of  ascites noted in the visualized portions of the peritoneal cavity. Musculoskeletal: No aggressive appearing osseous lesions are noted in the visualized portions of the skeleton. IMPRESSION: 1. Cholelithiasis without evidence of acute cholecystitis at this time. 2. No choledocholithiasis. No findings of biliary tract obstruction. 3. 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 Additional incidental findings, as above. Electronically Signed   By: Vinnie Langton M.D.   On: 05/11/2020 20:15   US Abdomen Limited  Result  Date: 05/12/2020 CLINICAL DATA:  Elevated LFTs EXAM: ULTRASOUND ABDOMEN LIMITED RIGHT UPPER QUADRANT COMPARISON:  None. FINDINGS: Gallbladder: 2.6 cm gallstone within the gallbladder. Sludge also noted layering in the gallbladder. No wall thickening or sonographic Murphy's. Common bile duct: Diameter: Normal caliber, 6 mm Liver: No focal lesion identified. Within normal limits in parenchymal echogenicity. Portal vein is patent on color Doppler imaging with normal direction of blood flow towards the liver. Other: None. IMPRESSION: Cholelithiasis and layering sludge within the gallbladder. No sonographic evidence of acute cholecystitis. Electronically Signed   By: Rolm Baptise M.D.   On: 05/12/2020 09:27   DG Chest Port 1 View  Result Date: 05/11/2020 CLINICAL DATA:  Shortness of breath x2 days. EXAM: PORTABLE CHEST 1 VIEW COMPARISON:  May 10, 2020 FINDINGS: Mild, stable diffuse chronic appearing increased interstitial lung markings are seen. There is no evidence of acute infiltrate, pleural effusion or pneumothorax. The cardiac silhouette is markedly enlarged and unchanged in size. There is moderate severity calcification of the aortic arch. Degenerative changes are seen throughout the thoracic spine. IMPRESSION: Stable cardiomegaly without evidence of acute or active cardiopulmonary disease. Electronically Signed   By: Virgina Norfolk M.D.   On: 05/11/2020 17:40    ROS:   Pertinent items are noted in HPI.  Blood pressure (!) 129/56, pulse 87, temperature (!) 97.5 F (36.4 C), temperature source Oral, resp. rate 15, height 6' (1.829 m), weight 129.9 kg, SpO2 100 %. Physical Exam: Pleasant white male no acute distress Head is normocephalic, atraumatic Lungs clear auscultation with good breath sounds bilaterally Heart examination reveals a regular rate and rhythm without S3, S4, murmurs Abdomen is soft but I do think he has a palpable right subcostal liver edge.  I do not palpate the gallbladder.  No rigidity is noted.  No tenderness is noted.  All imaging studies reviewed  Assessment/Plan: Impression: Acute cholangitis which has now been resolving.  He does have cholelithiasis but not acute cholecystitis.  The patient may have passed a stone or have a component of Mirizzi syndrome.  He has multiple comorbidities that make at higher risk for general anesthesia and undergoing a cholecystectomy.  Since this episode has resolved fairly quickly, I would like to see how his liver enzyme tests are in the morning.  I would like to delay any elective cholecystectomy unless absolutely necessary.  Patient understands this.  I did discuss this with Dr. Roderic Palau.  Will reassess in a.m.  Aviva Signs 05/13/2020, 12:38 PM

## 2020-05-14 LAB — GLUCOSE, CAPILLARY
Glucose-Capillary: 133 mg/dL — ABNORMAL HIGH (ref 70–99)
Glucose-Capillary: 165 mg/dL — ABNORMAL HIGH (ref 70–99)
Glucose-Capillary: 151 mg/dL — ABNORMAL HIGH (ref 70–99)
Glucose-Capillary: 150 mg/dL — ABNORMAL HIGH (ref 70–99)

## 2020-05-14 LAB — COMPREHENSIVE METABOLIC PANEL
ALT: 88 U/L — ABNORMAL HIGH (ref 0–44)
AST: 102 U/L — ABNORMAL HIGH (ref 15–41)
Albumin: 2.2 g/dL — ABNORMAL LOW (ref 3.5–5.0)
Alkaline Phosphatase: 139 U/L — ABNORMAL HIGH (ref 38–126)
Anion gap: 11 (ref 5–15)
BUN: 59 mg/dL — ABNORMAL HIGH (ref 8–23)
CO2: 24 mmol/L (ref 22–32)
Calcium: 7 mg/dL — ABNORMAL LOW (ref 8.9–10.3)
Chloride: 100 mmol/L (ref 98–111)
Creatinine, Ser: 2.89 mg/dL — ABNORMAL HIGH (ref 0.61–1.24)
GFR, Estimated: 23 mL/min — ABNORMAL LOW (ref >60)
Glucose, Bld: 186 mg/dL — ABNORMAL HIGH (ref 70–99)
Potassium: 2.8 mmol/L — ABNORMAL LOW (ref 3.5–5.1)
Sodium: 135 mmol/L (ref 135–145)
Total Bilirubin: 1.6 mg/dL — ABNORMAL HIGH (ref 0.3–1.2)
Total Protein: 5.9 g/dL — ABNORMAL LOW (ref 6.5–8.1)

## 2020-05-14 LAB — MAGNESIUM: Magnesium: 1.8 mg/dL (ref 1.7–2.4)

## 2020-05-14 LAB — POTASSIUM: Potassium: 3.5 mmol/L (ref 3.5–5.1)

## 2020-05-14 MED ORDER — TORSEMIDE 20 MG PO TABS
60.0000 mg | ORAL_TABLET | Freq: Two times a day (BID) | ORAL | Status: DC
  Administered 2020-05-14: 17:00:00 60 mg via ORAL
  Filled 2020-05-14: qty 3

## 2020-05-14 MED ORDER — CEPHALEXIN 500 MG PO CAPS: 500.0000 mg | ORAL_CAPSULE | Freq: Four times a day (QID) | ORAL | 0 refills | Status: AC

## 2020-05-14 MED ORDER — ALPRAZOLAM 0.5 MG PO TABS
0.5000 mg | ORAL_TABLET | Freq: Once | ORAL | Status: AC
  Administered 2020-05-14: 17:00:00 0.5 mg via ORAL
  Filled 2020-05-14: qty 1

## 2020-05-14 MED ORDER — SALINE SPRAY 0.65 % NA SOLN
1.0000 | NASAL | Status: DC | PRN
  Administered 2020-05-14 (×3): 1 via NASAL
  Filled 2020-05-14: qty 44

## 2020-05-14 MED ORDER — POTASSIUM CHLORIDE CRYS ER 20 MEQ PO TBCR
40.0000 meq | EXTENDED_RELEASE_TABLET | ORAL | Status: AC
  Administered 2020-05-14 (×2): 40 meq via ORAL
  Filled 2020-05-14 (×2): qty 2

## 2020-05-14 MED ORDER — POTASSIUM CHLORIDE ER 20 MEQ PO TBCR: 20.0000 meq | EXTENDED_RELEASE_TABLET | Freq: Every day | ORAL | Status: DC

## 2020-05-14 NOTE — Progress Notes (Signed)
EMS present to transport pt to [facility]. Pt VSS. Belongings given to EMS. Pt son updated via phone of discharge. Expressed concern that father is not feeling well. Explained pt was deemed medically stable and would need to follow up outpatient.

## 2020-05-14 NOTE — TOC Transition Note (Signed)
Transition of Care [facility]) - CM/SW Discharge Note  Patient Details  Name: Eric Scott MRN: [id number] Date of Birth: [birth year]  Transition of Care [facility]) CM/SW Contact:  Sherie Don, LCSW Phone Number: 05/14/2020, 4:40 PM  Clinical Narrative: Patient to discharge back to [facility]. Per Jackelyn Poling at [facility], he can return today. Discharge summary and discharge orders faxed to [facility] via hub. EMS called; medical necessity form complete and provided to ICU RN station. Patient will go to room A6 bed 1. TOC signing off.  Readmission Risk Interventions Readmission Risk Prevention Plan 05/21/2019 05/18/2019 04/22/2019  Transportation Screening Complete Complete Complete  PCP or Specialist Appt within 3-5 Days - Complete -  HRI or [place] - Complete -  Social Work Consult for [place] Planning/Counseling - Complete -  Palliative Care Screening - Not Applicable -  Medication Review [employer]) - Complete Complete  PCP or Specialist appointment within 3-5 days of discharge - - Not Complete  PCP/Specialist Appt Not Complete comments - - SNF MD  [place] or [place] - - Not Complete  HRI or Home Care Consult Pt Refusal Comments - - from SNF  SW Recovery Care/Counseling Consult - - Complete  Palliative Care Screening - - Not [place] - - Complete  Some recent data might be hidden

## 2020-05-14 NOTE — Discharge Summary (Addendum)
Physician Discharge Summary  PLUMMER MATICH HYI:[id number] DOB: [birth year] DOA: 05/11/2020  PCP: [name], MD  Admit date: 05/11/2020 Discharge date: 05/14/2020  Admitted From: SNF Disposition:  SNF  Recommendations for Outpatient Follow-up:  1. Follow up with PCP in 1-2 weeks 2. Please obtain CMP/CBC in one week 3. Follow up with GI and Gen surgery as needed  Discharge Condition:stable CODE STATUS:full code Diet recommendation: heart healthy/carb mod  Brief/Interim Summary: 69 year old male with a history of chronic kidney disease stage IV, diabetes, hypertension, chronic diastolic congestive heart failure, was initially evaluated in the emergency room on 12/7 with complaints of shortness of breath.  He was not found to have any pneumonia.  Blood cultures were sent, but patient was felt stable for discharge.  The following day, he was found to have positive blood cultures with Klebsiella and was advised to return to the emergency room.  Upon arrival, he did admit to having fever, chills and some right upper quadrant pain.  Work-up in the emergency room showed elevated liver enzymes.  Imaging indicated cholelithiasis without any evidence of choledocholithiasis.  Patient was admitted for further treatments  Discharge Diagnoses:  Active Problems:   Type 2 diabetes mellitus with stage 4 chronic kidney disease, with Vandivier-term current use of insulin ([name])   HTN (hypertension)   Anxiety   Arterial insufficiency of lower extremity ([name])   Chronic respiratory failure with hypoxia ([name])   Bacteremia   Elevated liver enzymes   Cholelithiasis   Abdominal pain  Klebsiella bacteremia -felt to be from biliary origin, likely transient cholangitis -Treated with ceftriaxone -transitioned to keflex  Elevated liver function tests -Noted to have elevated bilirubin -Imaging did not indicate any evidence of choledocholithiasis and bile duct was not enlarged -Patient did have evidence of  cholelithiasis and may have passed a stone -May have developed transient cholangitis -Treated with IV antibiotics -Overall LFTs are improved -Seen by general surgery and since patient would be a high risk surgical candidate, it was not felt that cholecystectomy was indicated at this time -should he develop recurring abd pain and elevated LFTs, then cholecystectomy would need to be revisited at that time  COPD with chronic hypoxic respiratory failure -No further wheezing at this time -Respiratory status appears to be at baseline on chronic oxygen requirement of 3 L -Continue bronchodilators as needed  Chronic kidney disease stage IV -Creatinine currently at baseline -Continue to monitor  Diabetes -Continue Lantus and sliding scale -Blood sugars are stable  Hypertension -stable on amlodipine and metoprolol  Hypokalemia -Replaced -Magnesium is low and will be replaced  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 -At baseline, continue to monitor  Discharge Instructions   Allergies as of 05/14/2020      Reactions   Blueberry Flavor Other (See Comments)   Unknown   Cucumber Extract Other (See Comments)   "Fells like I'm having a heart attack"   Flexeril [cyclobenzaprine Hcl] Other (See Comments)   "whole body  Tremors" (05/27/2012)   Kiwi Extract Other (See Comments)   "feels like I'm having a heart attack"      Medication List    TAKE these medications   acetaminophen 325 MG tablet Commonly known as: TYLENOL Take 2 tablets (650 mg total) by mouth every 6 (six) hours as needed for mild pain, fever or headache (or Fever >/= 101).   albuterol (2.5 MG/3ML) 0.083% nebulizer solution Commonly known as: PROVENTIL Take 2.5 mg by nebulization in the morning and at bedtime.   allopurinol 100  MG tablet Commonly known as: ZYLOPRIM Take 1 tablet by mouth daily. For gout   ALPRAZolam 0.5 MG tablet Commonly known as: XANAX Take 0.5 mg by mouth at bedtime as needed for  anxiety.   amLODipine 10 MG tablet Commonly known as: NORVASC Take 1 tablet (10 mg total) by mouth daily.   aspirin EC 81 MG tablet Take 1 tablet (81 mg total) by mouth daily with breakfast.   Breo Ellipta 100-25 MCG/INH Aepb Generic drug: fluticasone furoate-vilanterol Inhale 1 puff into the lungs daily.   calcitRIOL 0.25 MCG capsule Commonly known as: ROCALTROL Take 0.25 mcg by mouth daily.   calcium carbonate 500 MG chewable tablet Commonly known as: TUMS - dosed in mg elemental calcium Chew 2 tablets by mouth 2 (two) times daily.   cephALEXin 500 MG capsule Commonly known as: KEFLEX Take 1 capsule (500 mg total) by mouth 4 (four) times daily for 10 days.   cholecalciferol 1000 units tablet Commonly known as: VITAMIN D Take 5,000 Units by mouth daily.   cyanocobalamin 1000 MCG tablet Take 1,000 mcg by mouth daily.   epoetin alfa 3000 UNIT/ML injection Commonly known as: EPOGEN Inject 3,000 Units into the vein every 14 (fourteen) days.   fish oil-omega-3 fatty acids 1000 MG capsule Take 1 g by mouth 2 (two) times daily.   FLUoxetine 20 MG capsule Commonly known as: PROZAC Take 20 mg by mouth daily.   gabapentin 300 MG capsule Commonly known as: NEURONTIN Take 1 capsule (300 mg total) by mouth at bedtime.   GUAIFENESIN PO Take 600 mg by mouth.   HYDROcodone-acetaminophen 5-325 MG tablet Commonly known as: NORCO/VICODIN Take 1 tablet by mouth every 12 (twelve) hours as needed for moderate pain or severe pain. What changed: when to take this   insulin aspart 100 UNIT/ML injection Commonly known as: novoLOG Inject 2-14 Units into the skin 3 (three) times daily before meals. Sliding scale 150-200=2units, 201-250=4units, 251-300=6units, 301-349=8units, 350-400=10 units, 401-450=12units, 451-500=14units before meals and at betime   lactulose 10 GM/15ML solution Commonly known as: CHRONULAC Take 15 mLs (10 g total) by mouth See admin instructions. --Please give  22mL [10 g total] every Monday and Friday due to tendency for high ammonia   Lantus 100 UNIT/ML injection Generic drug: insulin glargine Inject 32 Units into the skin at bedtime.   Lidocaine 4 % Ptch Apply 1 patch topically daily.   magnesium oxide 400 MG tablet Commonly known as: MAG-OX Take 400 mg by mouth daily.   metoprolol tartrate 50 MG tablet Commonly known as: LOPRESSOR Take 50 mg by mouth 2 (two) times daily.   multivitamin tablet Take 1 tablet by mouth daily. For wound healing   ondansetron 4 MG tablet Commonly known as: ZOFRAN Take 4 mg by mouth every 8 (eight) hours as needed.   pantoprazole 40 MG tablet Commonly known as: PROTONIX Take 1 tablet (40 mg total) by mouth daily before breakfast.   polyethylene glycol 17 g packet Commonly known as: MIRALAX / GLYCOLAX Take 17 g by mouth daily. For constipation   Potassium Chloride ER 20 MEQ Tbcr Take 20 mEq by mouth daily.   senna 8.6 MG tablet Commonly known as: SENOKOT Take by mouth.   tamsulosin 0.4 MG Caps capsule Commonly known as: FLOMAX Take 1 capsule (0.4 mg total) by mouth daily after supper.   torsemide 20 MG tablet Commonly known as: Demadex Take 2 tablets (40 mg total) by mouth 2 (two) times daily. What changed: how much to take  Contact information for after-discharge care    [facility] .   Service: Skilled Nursing Contact information: [place] [phone]                 Allergies  Allergen Reactions  . Blueberry Flavor Other (See Comments)    Unknown  . Cucumber Extract Other (See Comments)    "Fells like I'm having a heart attack"  . Flexeril [Cyclobenzaprine Hcl] Other (See Comments)    "whole body  Tremors" (05/27/2012)  . Kiwi Extract Other (See Comments)    "feels like I'm having a heart attack"    Consultations:  GI Gen Surgery   Procedures/Studies: CT ABDOMEN  PELVIS WO CONTRAST  Result Date: 05/11/2020 CLINICAL DATA:  69 year old male with history of elevated liver function tests. Cholelithiasis. Generalized weakness, fatigue and diffuse abdominal pain, most severe on the right side for the past 2 days. EXAM: CT ABDOMEN AND PELVIS WITHOUT CONTRAST TECHNIQUE: Multidetector CT imaging of the abdomen and pelvis was performed following the standard protocol without IV contrast. COMPARISON:  CT the abdomen and pelvis 11/23/2016. FINDINGS: Lower chest: Atherosclerotic calcifications in the descending thoracic aorta as well as the right coronary artery. Small amount of pericardial fluid and/or thickening, wound with intermediate attenuation fluid. No definite pericardial calcification. Hepatobiliary: Diffuse low attenuation, indicative throughout the of hepatic steatosis. Hepatic parenchyma multiple faintly peripherally calcified gallstones measuring up to 2.7 cm in the gallbladder. Gallbladder does not appear overly distended. No pericholecystic fluid or inflammatory changes. Pancreas: No definite pancreatic mass or peripancreatic fluid collections or inflammatory changes are noted on today's noncontrast CT examination. Spleen: Unremarkable. Adrenals/Urinary Tract: Low-attenuation adrenal form thickening in the adrenal glands bilaterally, likely to represent adenomatous hyperplasia. Multiple small renal lesions bilaterally, incompletely characterized on today's noncontrast CT but ranging from low to intermediate attenuation. No hydroureteronephrosis. Urinary bladder is unremarkable in appearance. Stomach/Bowel: Normal appearance of the stomach. No pathologic dilatation of small bowel or colon. Numerous colonic diverticulae are noted, without surrounding inflammatory changes to suggest an acute diverticulitis at this time. Normal appendix. Vascular/Lymphatic: Aortic atherosclerosis. Multiple prominent borderline enlarged and mildly enlarged upper abdominal lymph nodes are  noted, measuring up to 1.7 cm in short axis in the portacaval nodal station. Reproductive: Prostate gland and seminal vesicles are unremarkable in appearance. Other: No significant volume of ascites.  No pneumoperitoneum. Musculoskeletal: There are no aggressive appearing lytic or blastic lesions noted in the visualized portions of the skeleton. IMPRESSION: 1. Cholelithiasis without definitive findings to suggest an acute cholecystitis at this time. 2. Colonic diverticulosis without evidence of acute diverticulitis at this time. 3. New intermediate attenuation small amount of pericardial fluid and/or thickening. Clinical correlation for signs and symptoms of pericarditis is recommended. 4. Multiple prominent borderline enlarged and mildly enlarged upper abdominal lymph nodes measuring up to 1.7 cm in short axis in the portacaval nodal station, nonspecific, but likely benign (the portacaval lymph node did not demonstrate diffusion restriction on the contemporaneously obtained abdominal MRI). 5. 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 6. Aortic atherosclerosis, in addition to at least right coronary artery disease. Please note that although the presence of coronary artery calcium documents the presence of coronary artery disease, the severity of this disease and any potential stenosis cannot be assessed on this non-gated CT examination. Assessment for potential risk factor modification, dietary therapy or pharmacologic therapy may be warranted, if clinically indicated. 7. Additional incidental findings, as above. Electronically  Signed   By: Vinnie Langton M.D.   On: 05/11/2020 20:16   MR ABDOMEN MRCP WO CONTRAST  Result Date: 05/11/2020 CLINICAL DATA:  69 year old male with history of jaundice. Abdominal pain, most severe in the right upper quadrant. Fever, chills and dyspnea. EXAM: MRI ABDOMEN WITHOUT CONTRAST  (INCLUDING MRCP) TECHNIQUE: Multiplanar multisequence MR imaging of the abdomen was  performed. Heavily T2-weighted images of the biliary and pancreatic ducts were obtained, and three-dimensional MRCP images were rendered by post processing. COMPARISON:  No priors. FINDINGS: Comment: Today's study is limited for detection and characterization of visceral and/or vascular lesions by lack of IV gadolinium. Lower chest: Unremarkable. Hepatobiliary: No definite suspicious cystic or solid hepatic lesions are confidently identified on today's noncontrast examination. No intra or extrahepatic biliary ductal dilatation. Common bile duct measures 4 mm in the porta hepatis. No filling defect in the common bile duct to suggest choledocholithiasis. Numerous filling defects are noted within the gallbladder, compatible with gallstones measuring up to 2.4 cm. Gallbladder is only moderately distended. No definite gallbladder wall thickening or pericholecystic fluid or inflammatory changes. Pancreas: No definite pancreatic mass or peripancreatic fluid collections or inflammatory changes. No pancreatic ductal dilatation noted on MRCP images. Spleen:  Unremarkable. Adrenals/Urinary Tract: Multiple T1 hypointense and T2 hyperintense lesions are noted in the kidneys, incompletely characterized on today's noncontrast examination, but statistically likely to represent cysts, measuring up to 2.1 cm in the upper pole the left kidney. No hydroureteronephrosis in the visualized portions of the abdomen. Adrenal form thickening of the adrenal glands bilaterally which appear to lose signal intensity on out of phase dual echo images, indicative of adenomatous hyperplasia. Stomach/Bowel: Visualized portions are unremarkable. Vascular/Lymphatic: No aneurysm identified in the visualized abdominal vasculature. Mildly enlarged portacaval lymph node measuring 1.7 cm in short axis, without diffusion restriction. Other: No significant volume of ascites noted in the visualized portions of the peritoneal cavity. Musculoskeletal: No  aggressive appearing osseous lesions are noted in the visualized portions of the skeleton. IMPRESSION: 1. Cholelithiasis without evidence of acute cholecystitis at this time. 2. No choledocholithiasis. No findings of biliary tract obstruction. 3. 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 Additional incidental findings, as above. Electronically Signed   By: Vinnie Langton M.D.   On: 05/11/2020 20:15   MR 3D Recon At Scanner  Result Date: 05/11/2020 CLINICAL DATA:  69 year old male with history of jaundice. Abdominal pain, most severe in the right upper quadrant. Fever, chills and dyspnea. EXAM: MRI ABDOMEN WITHOUT CONTRAST  (INCLUDING MRCP) TECHNIQUE: Multiplanar multisequence MR imaging of the abdomen was performed. Heavily T2-weighted images of the biliary and pancreatic ducts were obtained, and three-dimensional MRCP images were rendered by post processing. COMPARISON:  No priors. FINDINGS: Comment: Today's study is limited for detection and characterization of visceral and/or vascular lesions by lack of IV gadolinium. Lower chest: Unremarkable. Hepatobiliary: No definite suspicious cystic or solid hepatic lesions are confidently identified on today's noncontrast examination. No intra or extrahepatic biliary ductal dilatation. Common bile duct measures 4 mm in the porta hepatis. No filling defect in the common bile duct to suggest choledocholithiasis. Numerous filling defects are noted within the gallbladder, compatible with gallstones measuring up to 2.4 cm. Gallbladder is only moderately distended. No definite gallbladder wall thickening or pericholecystic fluid or inflammatory changes. Pancreas: No definite pancreatic mass or peripancreatic fluid collections or inflammatory changes. No pancreatic ductal dilatation noted on MRCP images. Spleen:  Unremarkable. Adrenals/Urinary Tract: Multiple T1 hypointense and T2 hyperintense lesions are noted in the  kidneys, incompletely characterized  on today's noncontrast examination, but statistically likely to represent cysts, measuring up to 2.1 cm in the upper pole the left kidney. No hydroureteronephrosis in the visualized portions of the abdomen. Adrenal form thickening of the adrenal glands bilaterally which appear to lose signal intensity on out of phase dual echo images, indicative of adenomatous hyperplasia. Stomach/Bowel: Visualized portions are unremarkable. Vascular/Lymphatic: No aneurysm identified in the visualized abdominal vasculature. Mildly enlarged portacaval lymph node measuring 1.7 cm in short axis, without diffusion restriction. Other: No significant volume of ascites noted in the visualized portions of the peritoneal cavity. Musculoskeletal: No aggressive appearing osseous lesions are noted in the visualized portions of the skeleton. IMPRESSION: 1. Cholelithiasis without evidence of acute cholecystitis at this time. 2. No choledocholithiasis. No findings of biliary tract obstruction. 3. 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 Additional incidental findings, as above. Electronically Signed   By: Vinnie Langton M.D.   On: 05/11/2020 20:15   US Abdomen Limited  Result Date: 05/12/2020 CLINICAL DATA:  Elevated LFTs EXAM: ULTRASOUND ABDOMEN LIMITED RIGHT UPPER QUADRANT COMPARISON:  None. FINDINGS: Gallbladder: 2.6 cm gallstone within the gallbladder. Sludge also noted layering in the gallbladder. No wall thickening or sonographic Murphy's. Common bile duct: Diameter: Normal caliber, 6 mm Liver: No focal lesion identified. Within normal limits in parenchymal echogenicity. Portal vein is patent on color Doppler imaging with normal direction of blood flow towards the liver. Other: None. IMPRESSION: Cholelithiasis and layering sludge within the gallbladder. No sonographic evidence of acute cholecystitis. Electronically Signed   By: Rolm Baptise M.D.   On: 05/12/2020 09:27   DG Chest Port 1 View  Result Date:  05/11/2020 CLINICAL DATA:  Shortness of breath x2 days. EXAM: PORTABLE CHEST 1 VIEW COMPARISON:  May 10, 2020 FINDINGS: Mild, stable diffuse chronic appearing increased interstitial lung markings are seen. There is no evidence of acute infiltrate, pleural effusion or pneumothorax. The cardiac silhouette is markedly enlarged and unchanged in size. There is moderate severity calcification of the aortic arch. Degenerative changes are seen throughout the thoracic spine. IMPRESSION: Stable cardiomegaly without evidence of acute or active cardiopulmonary disease. Electronically Signed   By: Virgina Norfolk M.D.   On: 05/11/2020 17:40   DG Chest Port 1 View  Result Date: 05/10/2020 CLINICAL DATA:  Shortness of breath. EXAM: PORTABLE CHEST 1 VIEW COMPARISON:  01/26/2020 FINDINGS: Again noted is a rounded pulmonary nodule in the right upper lung zone. There is no pneumothorax. No large pleural effusion. The heart size remains enlarged. The trachea is midline. The lung volumes are slightly low. There is no acute osseous abnormality. Aortic calcifications are again noted. IMPRESSION: 1. No acute cardiopulmonary process. 2. Persistent rounded pulmonary nodule in the right upper lobe. This was present on the patient's CT from 4431 and is almost certainly a benign process. 3. Cardiomegaly with low lung volumes. Electronically Signed   By: Constance Holster M.D.   On: 05/10/2020 19:54      Subjective: No abdominal pain or vomiting.  Discharge Exam: Vitals:   05/14/20 1100 05/14/20 1200 05/14/20 1300 05/14/20 1400  BP: (!) 109/37 135/60 (!) 120/41 135/64  Pulse: (!) 55 66 65 67  Resp: 13 14 14 15   Temp:  (!) 97.5 F (36.4 C)    TempSrc:  Oral    SpO2: 100% 99% 99% 99%  Weight:      Height:        General: Pt is alert, awake, not in acute distress Cardiovascular:  RRR, S1/S2 +, no rubs, no gallops Respiratory: CTA bilaterally, no wheezing, no rhonchi Abdominal: Soft, NT, ND, bowel sounds  + Extremities: no edema, no cyanosis    The results of significant diagnostics from this hospitalization (including imaging, microbiology, ancillary and laboratory) are listed below for reference.     Microbiology: Recent Results (from the past 240 hour(s))  Resp Panel by RT-PCR (Flu A&B, Covid) Nasopharyngeal Swab     Status: None   Collection Time: 05/10/20  6:55 PM   Specimen: Nasopharyngeal Swab; Nasopharyngeal(NP) swabs in vial transport medium  Result Value Ref Range Status   SARS Coronavirus 2 by RT PCR NEGATIVE NEGATIVE Final    Comment: (NOTE) SARS-CoV-2 target nucleic acids are NOT DETECTED.  The SARS-CoV-2 RNA is generally detectable in upper respiratory specimens during the acute phase of infection. The lowest concentration of SARS-CoV-2 viral copies this assay can detect is 138 copies/mL. A negative result does not preclude SARS-Cov-2 infection and should not be used as the sole basis for treatment or other patient management decisions. A negative result may occur with  improper specimen collection/handling, submission of specimen other than nasopharyngeal swab, presence of viral mutation(s) within the areas targeted by this assay, and inadequate number of viral copies(<138 copies/mL). A negative result must be combined with clinical observations, patient history, and epidemiological information. The expected result is Negative.  Fact Sheet for Patients:  EntrepreneurPulse.com.au  Fact Sheet for Healthcare Providers:  IncredibleEmployment.be  This test is no t yet approved or cleared by the [place] FDA and  has been authorized for detection and/or diagnosis of SARS-CoV-2 by FDA under an Emergency Use Authorization (EUA). This EUA will remain  in effect (meaning this test can be used) for the duration of the COVID-19 declaration under Section 564(b)(1) of the Act, 21 U.S.C.section 360bbb-3(b)(1), unless the authorization  is terminated  or revoked sooner.       Influenza A by PCR NEGATIVE NEGATIVE Final   Influenza B by PCR NEGATIVE NEGATIVE Final    Comment: (NOTE) The Xpert Xpress SARS-CoV-2/FLU/RSV plus assay is intended as an aid in the diagnosis of influenza from Nasopharyngeal swab specimens and should not be used as a sole basis for treatment. Nasal washings and aspirates are unacceptable for Xpert Xpress SARS-CoV-2/FLU/RSV testing.  Fact Sheet for Patients: EntrepreneurPulse.com.au  Fact Sheet for Healthcare Providers: IncredibleEmployment.be  This test is not yet approved or cleared by the [place] FDA and has been authorized for detection and/or diagnosis of SARS-CoV-2 by FDA under an Emergency Use Authorization (EUA). This EUA will remain in effect (meaning this test can be used) for the duration of the COVID-19 declaration under Section 564(b)(1) of the Act, 21 U.S.C. section 360bbb-3(b)(1), unless the authorization is terminated or revoked.  Performed at [facility], [address] [place], [place] [address]   Culture, blood (Routine x 2)     Status: Abnormal (Preliminary result)   Collection Time: 05/10/20  8:42 PM   Specimen: BLOOD RIGHT HAND  Result Value Ref Range Status   Specimen Description   Final    BLOOD RIGHT HAND Performed at [facility] [address] [place], [place] [address]    Special Requests   Final    BOTTLES DRAWN AEROBIC AND ANAEROBIC Blood Culture adequate volume Performed at [facility], [address] [place], [place] [address]    Culture  Setup Time   Final    GRAM NEGATIVE RODS ANAEROBIC BOTTLE Gram Stain Report Called to,Read Back By and  Verified With: LOOMIS,K ON 05/13/20 AT 1835 BY LOY,C Performed at [facility].  VALUE IS CONSISTENT WITH PREVIOUSLY REPORTED AND CALLED VALUE. GRAM NEGATIVE RODS AEROBIC BOTTLE ONLY RESULT PREV. CALLED Performed at [facility], [address] [place], [place] [address]    Culture (A)  Final    KLEBSIELLA PNEUMONIAE SUSCEPTIBILITIES PERFORMED ON PREVIOUS CULTURE WITHIN THE LAST 5 DAYS. Performed at [facility], [place] [address], [place], [place] [address]    Report Status PENDING  Incomplete  Culture, blood (Routine x 2)     Status: Abnormal   Collection Time: 05/10/20  8:48 PM   Specimen: BLOOD RIGHT ARM  Result Value Ref Range Status   Specimen Description   Final    BLOOD RIGHT ARM Performed at [facility], [address], [place], [place] [address]    Special Requests   Final    BOTTLES DRAWN AEROBIC AND ANAEROBIC Blood Culture adequate volume Performed at [name], [address] [place], [place] [address]    Culture  Setup Time   Final    GRAM NEGATIVE RODS AEROBIC BOTTLE ONLY Gram Stain Report Called to,Read Back By and Verified With: TRAVIS DEVOLT @1212  05/11/20 BY JONES,T [facility] Organism ID to follow CRITICAL RESULT CALLED TO, READ BACK BY AND VERIFIED WITH: Gabriel Earing RN 05/11/20 at 1831 sk Performed at [facility], [place] [address] [place],  [address]    Culture KLEBSIELLA PNEUMONIAE (A)  Final   Report Status 05/13/2020 FINAL  Final   Organism ID, Bacteria KLEBSIELLA PNEUMONIAE  Final      Susceptibility   Klebsiella pneumoniae - MIC*    AMPICILLIN >=32 RESISTANT Resistant     CEFAZOLIN <=4 SENSITIVE Sensitive     CEFEPIME <=0.12 SENSITIVE Sensitive     CEFTAZIDIME <=1 SENSITIVE Sensitive     CEFTRIAXONE <=0.25 SENSITIVE Sensitive     CIPROFLOXACIN <=0.25 SENSITIVE Sensitive     GENTAMICIN <=1 SENSITIVE Sensitive     IMIPENEM 0.5 SENSITIVE Sensitive     TRIMETH/SULFA <=20 SENSITIVE Sensitive     AMPICILLIN/SULBACTAM 8 SENSITIVE Sensitive     PIP/TAZO <=4 SENSITIVE Sensitive     * KLEBSIELLA PNEUMONIAE  Blood Culture ID Panel (Reflexed)     Status: Abnormal   Collection Time: 05/10/20  8:48 PM  Result Value Ref Range Status   Enterococcus faecalis NOT DETECTED NOT DETECTED  Final   Enterococcus Faecium NOT DETECTED NOT DETECTED Final   Listeria monocytogenes NOT DETECTED NOT DETECTED Final   Staphylococcus species NOT DETECTED NOT DETECTED Final   Staphylococcus aureus (BCID) NOT DETECTED NOT DETECTED Final   Staphylococcus epidermidis NOT DETECTED NOT DETECTED Final   Staphylococcus lugdunensis NOT DETECTED NOT DETECTED Final   Streptococcus species NOT DETECTED NOT DETECTED Final   Streptococcus agalactiae NOT DETECTED NOT DETECTED Final   Streptococcus pneumoniae NOT DETECTED NOT DETECTED Final   Streptococcus pyogenes NOT DETECTED NOT DETECTED Final   A.calcoaceticus-baumannii NOT DETECTED NOT DETECTED Final   Bacteroides fragilis NOT DETECTED NOT DETECTED Final   Enterobacterales DETECTED (A) NOT DETECTED Final    Comment: Enterobacterales represent a large order of gram negative bacteria, not a single organism. CRITICAL RESULT CALLED TO, READ BACK BY AND VERIFIED WITH: M WHITE RN 05/11/20 AT 9024 SK     Enterobacter cloacae complex NOT DETECTED NOT DETECTED Final   Escherichia coli NOT DETECTED NOT DETECTED Final   Klebsiella aerogenes NOT DETECTED NOT DETECTED Final   Klebsiella oxytoca  NOT DETECTED NOT DETECTED Final   Klebsiella pneumoniae DETECTED (A) NOT DETECTED Final    Comment: CRITICAL RESULT CALLED TO, READ BACK BY AND VERIFIED WITH: M WHITE RN 05/11/20 AT 1831 SK     Proteus species NOT DETECTED NOT DETECTED Final   Salmonella species NOT DETECTED NOT DETECTED Final   Serratia marcescens NOT DETECTED NOT DETECTED Final   Haemophilus influenzae NOT DETECTED NOT DETECTED Final   Neisseria meningitidis NOT DETECTED NOT DETECTED Final   Pseudomonas aeruginosa NOT DETECTED NOT DETECTED Final   Stenotrophomonas maltophilia NOT DETECTED NOT DETECTED Final   Candida albicans NOT DETECTED NOT DETECTED Final   Candida auris NOT DETECTED NOT DETECTED Final   Candida glabrata NOT DETECTED NOT DETECTED Final   Candida krusei NOT DETECTED NOT  DETECTED Final   Candida parapsilosis NOT DETECTED NOT DETECTED Final   Candida tropicalis NOT DETECTED NOT DETECTED Final   Cryptococcus neoformans/gattii NOT DETECTED NOT DETECTED Final   CTX-M ESBL NOT DETECTED NOT DETECTED Final   Carbapenem resistance IMP NOT DETECTED NOT DETECTED Final   Carbapenem resistance KPC NOT DETECTED NOT DETECTED Final   Carbapenem resistance NDM NOT DETECTED NOT DETECTED Final   Carbapenem resist OXA 48 LIKE NOT DETECTED NOT DETECTED Final   Carbapenem resistance VIM NOT DETECTED NOT DETECTED Final    Comment: Performed at [facility], [address] [place], [place] [address]  Resp Panel by RT-PCR (Flu A&B, Covid) Nasopharyngeal Swab     Status: None   Collection Time: 05/11/20  6:20 PM   Specimen: Nasopharyngeal Swab; Nasopharyngeal(NP) swabs in vial transport medium  Result Value Ref Range Status   SARS Coronavirus 2 by RT PCR NEGATIVE NEGATIVE Final    Comment: (NOTE) SARS-CoV-2 target nucleic acids are NOT DETECTED.  The SARS-CoV-2 RNA is generally detectable in upper respiratory specimens during the acute phase of infection. The lowest concentration of SARS-CoV-2 viral copies this assay can detect is 138 copies/mL. A negative result does not preclude SARS-Cov-2 infection and should not be used as the sole basis for treatment or other patient management decisions. A negative result may occur with  improper specimen collection/handling, submission of specimen other than nasopharyngeal swab, presence of viral mutation(s) within the areas targeted by this assay, and inadequate number of viral copies(<138 copies/mL). A negative result must be combined with clinical observations, patient history, and epidemiological information. The expected result is Negative.  Fact Sheet for Patients:  EntrepreneurPulse.com.au  Fact Sheet for Healthcare Providers:  IncredibleEmployment.be  This test is no t yet  approved or cleared by the [place] FDA and  has been authorized for detection and/or diagnosis of SARS-CoV-2 by FDA under an Emergency Use Authorization (EUA). This EUA will remain  in effect (meaning this test can be used) for the duration of the COVID-19 declaration under Section 564(b)(1) of the Act, 21 U.S.C.section 360bbb-3(b)(1), unless the authorization is terminated  or revoked sooner.       Influenza A by PCR NEGATIVE NEGATIVE Final   Influenza B by PCR NEGATIVE NEGATIVE Final    Comment: (NOTE) The Xpert Xpress SARS-CoV-2/FLU/RSV plus assay is intended as an aid in the diagnosis of influenza from Nasopharyngeal swab specimens and should not be used as a sole basis for treatment. Nasal washings and aspirates are unacceptable for Xpert Xpress SARS-CoV-2/FLU/RSV testing.  Fact Sheet for Patients: EntrepreneurPulse.com.au  Fact Sheet for Healthcare Providers: IncredibleEmployment.be  This test is not yet approved or cleared by the [place] and  has been authorized for detection and/or diagnosis of SARS-CoV-2 by FDA under an Emergency Use Authorization (EUA). This EUA will remain in effect (meaning this test can be used) for the duration of the COVID-19 declaration under Section 564(b)(1) of the Act, 21 U.S.C. section 360bbb-3(b)(1), unless the authorization is terminated or revoked.  Performed at [facility], [address], [place], [place] [address]   Urine culture     Status: Abnormal   Collection Time: 05/11/20  9:00 PM   Specimen: In/Out Cath Urine  Result Value Ref Range Status   Specimen Description   Final    IN/OUT CATH URINE Performed at [facility], [address] [place], [place] [address]    Special Requests   Final    NONE Performed at [facility], [address] [place], [place] [address]    Culture (A)  Final    <10,000 COLONIES/mL INSIGNIFICANT GROWTH Performed at [facility] [address] [place], [place] [address]    Report Status 05/13/2020 FINAL  Final  Culture, blood (routine x 2)     Status: None (Preliminary result)   Collection Time: 05/11/20  9:05 PM   Specimen: BLOOD LEFT ARM  Result Value Ref Range Status   Specimen Description BLOOD LEFT ARM  Final   Special Requests   Final    BOTTLES DRAWN AEROBIC AND ANAEROBIC Blood Culture adequate volume   Culture   Final    NO GROWTH 3 DAYS Performed at [facility], [address] [place], [place] [address]    Report Status PENDING  Incomplete  Culture, blood (routine x 2)     Status: None (Preliminary result)   Collection Time: 05/11/20  9:12 PM   Specimen: BLOOD LEFT HAND  Result Value Ref Range Status   Specimen Description BLOOD LEFT HAND  Final   Special Requests   Final    BOTTLES DRAWN AEROBIC AND ANAEROBIC Blood Culture adequate volume   Culture   Final    NO GROWTH 3 DAYS Performed at [facility], [address] [place],  [address]    Report Status PENDING  Incomplete  MRSA PCR Screening     Status: None   Collection Time: 05/12/20 11:37 AM   Specimen: Nasal Mucosa; Nasopharyngeal  Result Value Ref Range Status   MRSA by PCR NEGATIVE NEGATIVE Final    Comment:        The GeneXpert MRSA Assay (FDA approved for NASAL specimens only), is one component of a comprehensive MRSA colonization surveillance program. It is not intended to diagnose MRSA infection nor to guide or monitor treatment for MRSA infections. Performed at [facility], [address], [place],  [address]      Labs: BNP (last 3 results) Recent Labs    05/22/19 0439 11/16/19 0215 12/18/19 1200  BNP 250.0* 386.0* 482.5*   Basic Metabolic Panel: Recent Labs  Lab 05/10/20 1855 05/11/20 1621 05/12/20 0252 05/13/20 0534 05/14/20 0336 05/14/20 1343  NA 139 138 135 134* 135  --   K 3.2* 2.9* 4.0 3.7 2.8* 3.5  CL 98 98 96* 96* 100  --   CO2 28 27 24 25 24   --   GLUCOSE 192* 186* 267* 339* 186*  --    BUN 44* 45* 50* 60* 59*  --   CREATININE 3.61* 3.73* 3.90* 3.37* 2.89*  --   CALCIUM 7.4* 7.4* 7.2* 7.5* 7.0*  --   MG  --   --   --  1.5* 1.8  --    Liver Function Tests: Recent Labs  Lab 05/10/20 1855 05/11/20 1621 05/12/20 0252 05/13/20 0534 05/14/20 0336  AST 54* 44* 37 51* 102*  ALT 59* 48* 46* 46* 88*  ALKPHOS 163* 155* 150* 141* 139*  BILITOT 3.2* 3.9* 4.1* 2.2* 1.6*  PROT 7.3 6.8 6.5 6.6 5.9*  ALBUMIN 2.7* 2.5* 2.4* 2.5* 2.2*   No results for input(s): LIPASE, AMYLASE in the last 168 hours. No results for input(s): AMMONIA in the last 168 hours. CBC: Recent Labs  Lab 05/10/20 1855 05/11/20 1621 05/12/20 0252 05/13/20 0534  WBC 16.3* 7.8 7.0 4.8  NEUTROABS 15.2* 7.0  --   --   HGB 10.6* 9.8* 9.2* 9.2*  HCT 33.9* 31.3* 30.1* 29.6*  MCV 93.6 92.9 94.7 90.8  PLT 218 184 151 153   Cardiac Enzymes: No results for input(s): CKTOTAL, CKMB, CKMBINDEX, TROPONINI in the last 168 hours. BNP: Invalid input(s): POCBNP CBG: Recent Labs  Lab 05/13/20 1145 05/13/20 1610 05/13/20 2219 05/14/20 0740 05/14/20 1103  GLUCAP 276* 306* 234* 133* 165*   D-Dimer No results for input(s): DDIMER in the last 72 hours. Hgb A1c Recent Labs    05/12/20 0252  HGBA1C 6.5*   Lipid Profile No results for input(s): CHOL, HDL, LDLCALC, TRIG, CHOLHDL, LDLDIRECT in the last 72 hours. Thyroid function studies No results for input(s): TSH, T4TOTAL, T3FREE, THYROIDAB in the last 72 hours.  Invalid input(s): FREET3 Anemia work up No results for input(s): VITAMINB12, FOLATE, FERRITIN, TIBC, IRON, RETICCTPCT in the last 72 hours. Urinalysis    Component Value Date/Time   COLORURINE YELLOW 05/11/2020 2100   APPEARANCEUR HAZY (A) 05/11/2020 2100   LABSPEC 1.012 05/11/2020 2100   PHURINE 5.0 05/11/2020 2100   GLUCOSEU NEGATIVE 05/11/2020 2100   HGBUR NEGATIVE 05/11/2020 2100   [place] NEGATIVE 05/11/2020 2100   [place] NEGATIVE 05/11/2020 2100   PROTEINUR 30 (A) 05/11/2020  2100   UROBILINOGEN 0.2 03/04/2015 1835   NITRITE NEGATIVE 05/11/2020 2100   LEUKOCYTESUR MODERATE (A) 05/11/2020 2100   Sepsis Labs Invalid input(s): PROCALCITONIN,  WBC,  LACTICIDVEN Microbiology Recent Results (from the past 240 hour(s))  Resp Panel by RT-PCR (Flu A&B, Covid) Nasopharyngeal Swab     Status: None   Collection Time: 05/10/20  6:55 PM   Specimen: Nasopharyngeal Swab; Nasopharyngeal(NP) swabs in vial transport medium  Result Value Ref Range Status   SARS Coronavirus 2 by RT PCR NEGATIVE NEGATIVE Final    Comment: (NOTE) SARS-CoV-2 target nucleic acids are NOT DETECTED.  The SARS-CoV-2 RNA is generally detectable in upper respiratory specimens during the acute phase of infection. The lowest concentration of SARS-CoV-2 viral copies this assay can detect is 138 copies/mL. A negative result does not preclude SARS-Cov-2 infection and should not be used as the sole basis for treatment or other patient management decisions. A negative result may occur with  improper specimen collection/handling, submission of specimen other than nasopharyngeal swab, presence of viral mutation(s) within the areas targeted by this assay, and inadequate number of viral copies(<138 copies/mL). A negative result must be combined with clinical observations, patient history, and epidemiological information. The expected result is Negative.  Fact Sheet for Patients:  EntrepreneurPulse.com.au  Fact Sheet for Healthcare Providers:  IncredibleEmployment.be  This test is no t yet approved or cleared by the [place] FDA and  has been authorized for detection and/or diagnosis of SARS-CoV-2 by FDA under an Emergency Use Authorization (EUA). This EUA will remain  in effect (meaning this  test can be used) for the duration of the COVID-19 declaration under Section 564(b)(1) of the Act, 21 U.S.C.section 360bbb-3(b)(1), unless the authorization is terminated  or  revoked sooner.       Influenza A by PCR NEGATIVE NEGATIVE Final   Influenza B by PCR NEGATIVE NEGATIVE Final    Comment: (NOTE) The Xpert Xpress SARS-CoV-2/FLU/RSV plus assay is intended as an aid in the diagnosis of influenza from Nasopharyngeal swab specimens and should not be used as a sole basis for treatment. Nasal washings and aspirates are unacceptable for Xpert Xpress SARS-CoV-2/FLU/RSV testing.  Fact Sheet for Patients: EntrepreneurPulse.com.au  Fact Sheet for Healthcare Providers: IncredibleEmployment.be  This test is not yet approved or cleared by the [place] FDA and has been authorized for detection and/or diagnosis of SARS-CoV-2 by FDA under an Emergency Use Authorization (EUA). This EUA will remain in effect (meaning this test can be used) for the duration of the COVID-19 declaration under Section 564(b)(1) of the Act, 21 U.S.C. section 360bbb-3(b)(1), unless the authorization is terminated or revoked.  Performed at [facility], [address], [place], [place] [address]   Culture, blood (Routine x 2)     Status: Abnormal (Preliminary result)   Collection Time: 05/10/20  8:42 PM   Specimen: BLOOD RIGHT HAND  Result Value Ref Range Status   Specimen Description   Final    BLOOD RIGHT HAND Performed at [facility], [address] [place], [place] [address]    Special Requests   Final    BOTTLES DRAWN AEROBIC AND ANAEROBIC Blood Culture adequate volume Performed at [facility], [address] [place], [place] [address]    Culture  Setup Time   Final    GRAM NEGATIVE RODS ANAEROBIC BOTTLE Gram Stain Report Called to,Read Back By and Verified With: LOOMIS,K ON 05/13/20 AT 1835 BY LOY,C Performed at [place].  VALUE IS CONSISTENT WITH PREVIOUSLY REPORTED AND CALLED VALUE. GRAM NEGATIVE RODS AEROBIC BOTTLE ONLY RESULT PREV. CALLED Performed at [facility], [address]  [place], [place] [address]    Culture (A)  Final    KLEBSIELLA PNEUMONIAE SUSCEPTIBILITIES PERFORMED ON PREVIOUS CULTURE WITHIN THE LAST 5 DAYS. Performed at [facility], [place] [address], [place], [place] [address]    Report Status PENDING  Incomplete  Culture, blood (Routine x 2)     Status: Abnormal   Collection Time: 05/10/20  8:48 PM   Specimen: BLOOD RIGHT ARM  Result Value Ref Range Status   Specimen Description   Final    BLOOD RIGHT ARM Performed at [facility], [address] [place], [place] [address]    Special Requests   Final    BOTTLES DRAWN AEROBIC AND ANAEROBIC Blood Culture adequate volume Performed at [facility], [name] [place], [place] [address]    Culture  Setup Time   Final    GRAM NEGATIVE RODS AEROBIC BOTTLE ONLY Gram Stain Report Called to,Read Back By and Verified With: TRAVIS DEVOLT @1212  05/11/20 BY JONES,T [facility] Organism ID to follow CRITICAL RESULT CALLED TO, READ BACK BY AND VERIFIED WITH: Gabriel Earing RN 05/11/20 at 1831 sk Performed at [facility], [place] [address], [place], [place] [address]    Culture KLEBSIELLA PNEUMONIAE (A)  Final   Report Status 05/13/2020 FINAL  Final   Organism ID, Bacteria KLEBSIELLA PNEUMONIAE  Final      Susceptibility   Klebsiella pneumoniae - MIC*    AMPICILLIN >=32 RESISTANT  Resistant     CEFAZOLIN <=4 SENSITIVE Sensitive     CEFEPIME <=0.12 SENSITIVE Sensitive     CEFTAZIDIME <=1 SENSITIVE Sensitive     CEFTRIAXONE <=0.25 SENSITIVE Sensitive     CIPROFLOXACIN <=0.25 SENSITIVE Sensitive     GENTAMICIN <=1 SENSITIVE Sensitive     IMIPENEM 0.5 SENSITIVE Sensitive     TRIMETH/SULFA <=20 SENSITIVE Sensitive     AMPICILLIN/SULBACTAM 8 SENSITIVE Sensitive     PIP/TAZO <=4 SENSITIVE Sensitive     * KLEBSIELLA PNEUMONIAE  Blood Culture ID Panel (Reflexed)     Status: Abnormal   Collection Time: 05/10/20  8:48 PM  Result Value Ref Range Status   Enterococcus faecalis NOT DETECTED NOT DETECTED Final    Enterococcus Faecium NOT DETECTED NOT DETECTED Final   Listeria monocytogenes NOT DETECTED NOT DETECTED Final   Staphylococcus species NOT DETECTED NOT DETECTED Final   Staphylococcus aureus (BCID) NOT DETECTED NOT DETECTED Final   Staphylococcus epidermidis NOT DETECTED NOT DETECTED Final   Staphylococcus lugdunensis NOT DETECTED NOT DETECTED Final   Streptococcus species NOT DETECTED NOT DETECTED Final   Streptococcus agalactiae NOT DETECTED NOT DETECTED Final   Streptococcus pneumoniae NOT DETECTED NOT DETECTED Final   Streptococcus pyogenes NOT DETECTED NOT DETECTED Final   A.calcoaceticus-baumannii NOT DETECTED NOT DETECTED Final   Bacteroides fragilis NOT DETECTED NOT DETECTED Final   Enterobacterales DETECTED (A) NOT DETECTED Final    Comment: Enterobacterales represent a large order of gram negative bacteria, not a single organism. CRITICAL RESULT CALLED TO, READ BACK BY AND VERIFIED WITH: M WHITE RN 05/11/20 AT 2951 SK     Enterobacter cloacae complex NOT DETECTED NOT DETECTED Final   Escherichia coli NOT DETECTED NOT DETECTED Final   Klebsiella aerogenes NOT DETECTED NOT DETECTED Final   Klebsiella oxytoca NOT DETECTED NOT DETECTED Final   Klebsiella pneumoniae DETECTED (A) NOT DETECTED Final    Comment: CRITICAL RESULT CALLED TO, READ BACK BY AND VERIFIED WITH: M WHITE RN 05/11/20 AT 1831 SK     Proteus species NOT DETECTED NOT DETECTED Final   Salmonella species NOT DETECTED NOT DETECTED Final   Serratia marcescens NOT DETECTED NOT DETECTED Final   Haemophilus influenzae NOT DETECTED NOT DETECTED Final   Neisseria meningitidis NOT DETECTED NOT DETECTED Final   Pseudomonas aeruginosa NOT DETECTED NOT DETECTED Final   Stenotrophomonas maltophilia NOT DETECTED NOT DETECTED Final   Candida albicans NOT DETECTED NOT DETECTED Final   Candida auris NOT DETECTED NOT DETECTED Final   Candida glabrata NOT DETECTED NOT DETECTED Final   Candida krusei NOT DETECTED NOT DETECTED  Final   Candida parapsilosis NOT DETECTED NOT DETECTED Final   Candida tropicalis NOT DETECTED NOT DETECTED Final   Cryptococcus neoformans/gattii NOT DETECTED NOT DETECTED Final   CTX-M ESBL NOT DETECTED NOT DETECTED Final   Carbapenem resistance IMP NOT DETECTED NOT DETECTED Final   Carbapenem resistance KPC NOT DETECTED NOT DETECTED Final   Carbapenem resistance NDM NOT DETECTED NOT DETECTED Final   Carbapenem resist OXA 48 LIKE NOT DETECTED NOT DETECTED Final   Carbapenem resistance VIM NOT DETECTED NOT DETECTED Final    Comment: Performed at [facility], [address], [place], [place] [address]  Resp Panel by RT-PCR (Flu A&B, Covid) Nasopharyngeal Swab     Status: None   Collection Time: 05/11/20  6:20 PM   Specimen: Nasopharyngeal Swab; Nasopharyngeal(NP) swabs in vial transport medium  Result Value Ref Range Status   SARS Coronavirus 2 by RT PCR NEGATIVE  NEGATIVE Final    Comment: (NOTE) SARS-CoV-2 target nucleic acids are NOT DETECTED.  The SARS-CoV-2 RNA is generally detectable in upper respiratory specimens during the acute phase of infection. The lowest concentration of SARS-CoV-2 viral copies this assay can detect is 138 copies/mL. A negative result does not preclude SARS-Cov-2 infection and should not be used as the sole basis for treatment or other patient management decisions. A negative result may occur with  improper specimen collection/handling, submission of specimen other than nasopharyngeal swab, presence of viral mutation(s) within the areas targeted by this assay, and inadequate number of viral copies(<138 copies/mL). A negative result must be combined with clinical observations, patient history, and epidemiological information. The expected result is Negative.  Fact Sheet for Patients:  EntrepreneurPulse.com.au  Fact Sheet for Healthcare Providers:  IncredibleEmployment.be  This test is no t yet approved or  cleared by the [place] FDA and  has been authorized for detection and/or diagnosis of SARS-CoV-2 by FDA under an Emergency Use Authorization (EUA). This EUA will remain  in effect (meaning this test can be used) for the duration of the COVID-19 declaration under Section 564(b)(1) of the Act, 21 U.S.C.section 360bbb-3(b)(1), unless the authorization is terminated  or revoked sooner.       Influenza A by PCR NEGATIVE NEGATIVE Final   Influenza B by PCR NEGATIVE NEGATIVE Final    Comment: (NOTE) The Xpert Xpress SARS-CoV-2/FLU/RSV plus assay is intended as an aid in the diagnosis of influenza from Nasopharyngeal swab specimens and should not be used as a sole basis for treatment. Nasal washings and aspirates are unacceptable for Xpert Xpress SARS-CoV-2/FLU/RSV testing.  Fact Sheet for Patients: EntrepreneurPulse.com.au  Fact Sheet for Healthcare Providers: IncredibleEmployment.be  This test is not yet approved or cleared by the [place] FDA and has been authorized for detection and/or diagnosis of SARS-CoV-2 by FDA under an Emergency Use Authorization (EUA). This EUA will remain in effect (meaning this test can be used) for the duration of the COVID-19 declaration under Section 564(b)(1) of the Act, 21 U.S.C. section 360bbb-3(b)(1), unless the authorization is terminated or revoked.  Performed at [facility], [address] [place], [place] [address]   Urine culture     Status: Abnormal   Collection Time: 05/11/20  9:00 PM   Specimen: In/Out Cath Urine  Result Value Ref Range Status   Specimen Description   Final    IN/OUT CATH URINE Performed at [facility], [address], [place], [place] [address]    Special Requests   Final    NONE Performed at [facility], [address], [place], [place] [address]    Culture (A)  Final    <10,000 COLONIES/mL INSIGNIFICANT GROWTH Performed at [facility] [address],  [place], [place] [address]    Report Status 05/13/2020 FINAL  Final  Culture, blood (routine x 2)     Status: None (Preliminary result)   Collection Time: 05/11/20  9:05 PM   Specimen: BLOOD LEFT ARM  Result Value Ref Range Status   Specimen Description BLOOD LEFT ARM  Final   Special Requests   Final    BOTTLES DRAWN AEROBIC AND ANAEROBIC Blood Culture adequate volume   Culture   Final    NO GROWTH 3 DAYS Performed at [facility], [address], [place],  [address]    Report Status PENDING  Incomplete  Culture, blood (routine x 2)     Status: None (Preliminary result)   Collection Time: 05/11/20  9:12 PM   Specimen: BLOOD LEFT HAND  Result Value Ref Range Status   Specimen Description BLOOD LEFT HAND  Final   Special Requests   Final    BOTTLES DRAWN AEROBIC AND ANAEROBIC Blood Culture adequate volume   Culture   Final    NO GROWTH 3 DAYS Performed at [facility], [address], [place], [place] [address]    Report Status PENDING  Incomplete  MRSA PCR Screening     Status: None   Collection Time: 05/12/20 11:37 AM   Specimen: Nasal Mucosa; Nasopharyngeal  Result Value Ref Range Status   MRSA by PCR NEGATIVE NEGATIVE Final    Comment:        The GeneXpert MRSA Assay (FDA approved for NASAL specimens only), is one component of a comprehensive MRSA colonization surveillance program. It is not intended to diagnose MRSA infection nor to guide or monitor treatment for MRSA infections. Performed at [facility], [address], [place], [place] [address]      Time coordinating discharge: 40mins  SIGNED:   Kathie Dike, MD  [facility] 05/14/2020, 3:52 PM   If 7PM-7AM, please contact night-coverage [email or web address]

## 2020-05-14 NOTE — Progress Notes (Signed)
Subjective: Patient tolerating diet well.  Denies any abdominal pain.  Objective: Vital signs in last 24 hours: Temp:  [97.5 F (36.4 C)-97.7 F (36.5 C)] 97.7 F (36.5 C) (12/11 0803) Pulse Rate:  [46-87] 70 (12/11 1000) Resp:  [12-21] 13 (12/11 1000) BP: (112-144)/(27-90) 113/65 (12/11 1000) SpO2:  [97 %-100 %] 100 % (12/11 1000) Last BM Date: 05/11/20  Intake/Output from previous day: 12/10 0701 - 12/11 0700 In: 730.1 [P.O.:480; IV Piggyback:250.1] Out: 1225 [Urine:1225] Intake/Output this shift: No intake/output data recorded.  General appearance: alert, cooperative and no distress GI: soft, non-tender; bowel sounds normal; no masses,  no organomegaly  Lab Results:  Recent Labs    05/12/20 0252 05/13/20 0534  WBC 7.0 4.8  HGB 9.2* 9.2*  HCT 30.1* 29.6*  PLT 151 153   BMET Recent Labs    05/13/20 0534 05/14/20 0336  NA 134* 135  K 3.7 2.8*  CL 96* 100  CO2 25 24  GLUCOSE 339* 186*  BUN 60* 59*  CREATININE 3.37* 2.89*  CALCIUM 7.5* 7.0*   PT/INR Recent Labs    05/11/20 1621  LABPROT 15.3*  INR 1.3*    Studies/Results: No results found.  Anti-infectives: Anti-infectives (From admission, onward)   Start     Dose/Rate Route Frequency Ordered Stop   05/13/20 1700  vancomycin (VANCOREADY) IVPB 1750 mg/350 mL  Status:  Discontinued        1,750 mg 175 mL/hr over 120 Minutes Intravenous Every 48 hours 05/11/20 1708 05/12/20 1025   05/12/20 1600  ceFEPIme (MAXIPIME) 2 g in sodium chloride 0.9 % 100 mL IVPB  Status:  Discontinued        2 g 200 mL/hr over 30 Minutes Intravenous Every 24 hours 05/11/20 1612 05/12/20 1025   05/12/20 1200  cefTRIAXone (ROCEPHIN) 2 g in sodium chloride 0.9 % 100 mL IVPB        2 g 200 mL/hr over 30 Minutes Intravenous Every 24 hours 05/12/20 1026     05/11/20 1600  ceFEPIme (MAXIPIME) 2 g in sodium chloride 0.9 % 100 mL IVPB        2 g 200 mL/hr over 30 Minutes Intravenous  Once 05/11/20 1545 05/11/20 1641    05/11/20 1600  metroNIDAZOLE (FLAGYL) tablet 500 mg  Status:  Discontinued        500 mg Oral Every 8 hours 05/11/20 1545 05/12/20 1025   05/11/20 1600  vancomycin (VANCOCIN) IVPB 1000 mg/200 mL premix  Status:  Discontinued        1,000 mg 200 mL/hr over 60 Minutes Intravenous  Once 05/11/20 1545 05/11/20 1559   05/11/20 1600  vancomycin (VANCOREADY) IVPB 2000 mg/400 mL        2,000 mg 200 mL/hr over 120 Minutes Intravenous  Once 05/11/20 1559 05/11/20 2058      Assessment/Plan: Impression: Cholelithiasis without evidence of acute cholecystitis.  He did have cholangitis which has since resolved.  His liver enzyme elevation may be also secondary to a component of heart failure.  His jaundice has resolved.  His white blood cell count has normalized.  Given that he is a high risk for surgical intervention at this time, I would not proceed with a cholecystectomy during this admission.  Would monitor his symptoms as an outpatient.  Should they recur, he should be brought back to the emergency room and we can address cholecystectomy at that time.  Patient understands this.  Discussed with Dr. Roderic Palau.  LOS: 3 days    Elta [place]adel[place]  Arnoldo Morale 05/14/2020

## 2020-05-15 LAB — BLOOD CULTURE ID PANEL (REFLEXED) - BCID2

## 2020-05-16 LAB — CULTURE, BLOOD (ROUTINE X 2)
Culture: NO GROWTH
Culture: NO GROWTH
Special Requests: ADEQUATE
Special Requests: ADEQUATE
Special Requests: ADEQUATE

## 2020-05-17 ENCOUNTER — Telehealth: Payer: Self-pay | Admitting: Emergency Medicine

## 2020-05-17 NOTE — Telephone Encounter (Signed)
Post ED Visit - Positive Culture Follow-up  Culture report reviewed by antimicrobial stewardship pharmacist: [facility] []  Elenor Quinones, Pharm.D. []  Heide Guile, Pharm.D., BCPS AQ-ID []  Parks Neptune, Pharm.D., BCPS []  Alycia Rossetti, Pharm.D., BCPS []  Lueders, Pharm.D., BCPS, AAHIVP []  Legrand Como, Pharm.D., BCPS, AAHIVP []  [name], PharmD, BCPS []  Johnnette Gourd, PharmD, BCPS []  Hughes Better, PharmD, BCPS []  Leeroy Cha, PharmD []  Laqueta Linden, PharmD, BCPS []  Albertina Parr, PharmD Cristela Felt PharmD  [place] []  Leodis Sias, PharmD []  Lindell Spar, PharmD []  Royetta Asal, PharmD []  Graylin Shiver, Rph []  Rema Fendt) Glennon Mac, PharmD []  Arlyn Dunning, PharmD []  Netta Cedars, PharmD []  Dia Sitter, PharmD []  Leone Haven, PharmD []  Gretta Arab, PharmD []  Theodis Shove, PharmD []  Peggyann Juba, PharmD []  Reuel Boom, PharmD   Positive blood culture Treated with keflex, organism sensitive to the same and no further patient follow-up is required at this time.  Hazle Nordmann 05/17/2020, 1:26 PM

## 2020-05-20 ENCOUNTER — Other Ambulatory Visit: Payer: Self-pay

## 2020-05-20 ENCOUNTER — Encounter (HOSPITAL_COMMUNITY)
Admission: RE | Admit: 2020-05-20 | Discharge: 2020-05-20 | Disposition: A | Discharge status: Home / Self Care | Payer: Medicare (Managed Care) | Source: Ambulatory Visit | Attending: Nephrology | Admitting: Nephrology

## 2020-05-20 ENCOUNTER — Encounter (HOSPITAL_COMMUNITY): Payer: Self-pay

## 2020-05-20 DIAGNOSIS — N184 Chronic kidney disease, stage 4 (severe): Secondary | ICD-10-CM | POA: Insufficient documentation

## 2020-05-20 DIAGNOSIS — D631 Anemia in chronic kidney disease: Secondary | ICD-10-CM | POA: Diagnosis present

## 2020-05-20 LAB — POCT HEMOGLOBIN-HEMACUE: Hemoglobin: 11 g/dL — ABNORMAL LOW (ref 13.0–17.0)

## 2020-05-20 MED ORDER — EPOETIN ALFA 3000 UNIT/ML IJ SOLN: 3000.0000 [IU] | Freq: Once | INTRAMUSCULAR | Status: DC

## 2020-06-06 ENCOUNTER — Encounter (HOSPITAL_COMMUNITY): Payer: Medicare (Managed Care)

## 2020-06-06 ENCOUNTER — Encounter (HOSPITAL_COMMUNITY): Admission: RE | Admit: 2020-06-06 | Payer: Medicare (Managed Care) | Source: Ambulatory Visit

## 2020-06-09 ENCOUNTER — Encounter (HOSPITAL_COMMUNITY)
Admission: RE | Admit: 2020-06-09 | Discharge: 2020-06-09 | Disposition: A | Discharge status: Home / Self Care | Payer: Medicare (Managed Care) | Source: Ambulatory Visit | Attending: Nephrology | Admitting: Nephrology

## 2020-06-09 ENCOUNTER — Other Ambulatory Visit: Payer: Self-pay

## 2020-06-09 ENCOUNTER — Encounter (HOSPITAL_COMMUNITY): Payer: Self-pay

## 2020-06-09 DIAGNOSIS — D631 Anemia in chronic kidney disease: Secondary | ICD-10-CM | POA: Diagnosis present

## 2020-06-09 DIAGNOSIS — N184 Chronic kidney disease, stage 4 (severe): Secondary | ICD-10-CM | POA: Diagnosis present

## 2020-06-09 LAB — POCT HEMOGLOBIN-HEMACUE: Hemoglobin: 10.2 g/dL — ABNORMAL LOW (ref 13.0–17.0)

## 2020-06-09 MED ORDER — EPOETIN ALFA 3000 UNIT/ML IJ SOLN: 3000.0000 [IU] | Freq: Once | INTRAMUSCULAR | Status: DC

## 2020-06-23 ENCOUNTER — Encounter (HOSPITAL_COMMUNITY): Admission: RE | Admit: 2020-06-23 | Payer: Medicare (Managed Care) | Source: Ambulatory Visit

## 2020-06-23 ENCOUNTER — Encounter (HOSPITAL_COMMUNITY): Payer: Medicare (Managed Care)

## 2020-06-27 ENCOUNTER — Encounter (HOSPITAL_COMMUNITY)
Admission: RE | Admit: 2020-06-27 | Discharge: 2020-06-27 | Disposition: A | Discharge status: Home / Self Care | Payer: Medicare (Managed Care) | Source: Ambulatory Visit | Attending: Nephrology | Admitting: Nephrology

## 2020-06-27 ENCOUNTER — Other Ambulatory Visit: Payer: Self-pay

## 2020-06-27 ENCOUNTER — Encounter (HOSPITAL_COMMUNITY): Payer: Self-pay

## 2020-06-27 DIAGNOSIS — N184 Chronic kidney disease, stage 4 (severe): Secondary | ICD-10-CM | POA: Diagnosis not present

## 2020-06-27 LAB — POCT HEMOGLOBIN-HEMACUE: Hemoglobin: 10.5 g/dL — ABNORMAL LOW (ref 13.0–17.0)

## 2020-06-27 MED ORDER — EPOETIN ALFA 3000 UNIT/ML IJ SOLN: 3000.0000 [IU] | Freq: Once | INTRAMUSCULAR | Status: DC

## 2020-07-05 ENCOUNTER — Encounter: Payer: Self-pay | Admitting: Nurse Practitioner

## 2020-07-05 ENCOUNTER — Ambulatory Visit: Payer: Medicare (Managed Care) | Admitting: Nurse Practitioner

## 2020-07-11 ENCOUNTER — Encounter (HOSPITAL_COMMUNITY): Admission: RE | Admit: 2020-07-11 | Payer: Medicare (Managed Care) | Source: Ambulatory Visit

## 2020-07-11 ENCOUNTER — Inpatient Hospital Stay (HOSPITAL_COMMUNITY): Admission: RE | Admit: 2020-07-11 | Payer: Medicare (Managed Care) | Source: Ambulatory Visit

## 2020-07-15 ENCOUNTER — Other Ambulatory Visit: Payer: Self-pay

## 2020-07-15 ENCOUNTER — Encounter (HOSPITAL_COMMUNITY): Payer: Self-pay

## 2020-07-15 ENCOUNTER — Encounter (HOSPITAL_COMMUNITY)
Admission: RE | Admit: 2020-07-15 | Discharge: 2020-07-15 | Disposition: A | Discharge status: Home / Self Care | Payer: Medicare (Managed Care) | Source: Ambulatory Visit | Attending: Nephrology | Admitting: Nephrology

## 2020-07-15 DIAGNOSIS — D631 Anemia in chronic kidney disease: Secondary | ICD-10-CM | POA: Insufficient documentation

## 2020-07-15 DIAGNOSIS — N184 Chronic kidney disease, stage 4 (severe): Secondary | ICD-10-CM | POA: Insufficient documentation

## 2020-07-15 LAB — POCT HEMOGLOBIN-HEMACUE: Hemoglobin: 11.6 g/dL — ABNORMAL LOW (ref 13.0–17.0)

## 2020-07-15 MED ORDER — EPOETIN ALFA 3000 UNIT/ML IJ SOLN: 3000.0000 [IU] | Freq: Once | INTRAMUSCULAR | Status: DC

## 2020-08-01 ENCOUNTER — Encounter (HOSPITAL_COMMUNITY)
Admission: RE | Admit: 2020-08-01 | Discharge: 2020-08-01 | Disposition: A | Discharge status: Home / Self Care | Payer: Medicare (Managed Care) | Source: Ambulatory Visit | Attending: Nephrology | Admitting: Nephrology

## 2020-08-01 ENCOUNTER — Other Ambulatory Visit: Payer: Self-pay

## 2020-08-01 ENCOUNTER — Encounter (HOSPITAL_COMMUNITY): Payer: Self-pay

## 2020-08-01 DIAGNOSIS — N184 Chronic kidney disease, stage 4 (severe): Secondary | ICD-10-CM | POA: Diagnosis not present

## 2020-08-01 LAB — POCT HEMOGLOBIN-HEMACUE: Hemoglobin: 10.6 g/dL — ABNORMAL LOW (ref 13.0–17.0)

## 2020-08-01 MED ORDER — EPOETIN ALFA 3000 UNIT/ML IJ SOLN: 3000.0000 [IU] | Freq: Once | INTRAMUSCULAR | Status: DC

## 2020-08-16 ENCOUNTER — Encounter (HOSPITAL_COMMUNITY): Payer: Self-pay

## 2020-08-16 ENCOUNTER — Encounter (HOSPITAL_COMMUNITY)
Admission: RE | Admit: 2020-08-16 | Discharge: 2020-08-16 | Disposition: A | Discharge status: Home / Self Care | Payer: Medicare (Managed Care) | Source: Ambulatory Visit | Attending: Nephrology | Admitting: Nephrology

## 2020-08-16 ENCOUNTER — Other Ambulatory Visit: Payer: Self-pay

## 2020-08-16 DIAGNOSIS — N184 Chronic kidney disease, stage 4 (severe): Secondary | ICD-10-CM | POA: Insufficient documentation

## 2020-08-16 DIAGNOSIS — D631 Anemia in chronic kidney disease: Secondary | ICD-10-CM | POA: Insufficient documentation

## 2020-08-16 LAB — POCT HEMOGLOBIN-HEMACUE: Hemoglobin: 11.2 g/dL — ABNORMAL LOW (ref 13.0–17.0)

## 2020-08-16 MED ORDER — EPOETIN ALFA 3000 UNIT/ML IJ SOLN: 3000.0000 [IU] | Freq: Once | INTRAMUSCULAR | Status: DC

## 2020-08-30 ENCOUNTER — Other Ambulatory Visit: Payer: Self-pay

## 2020-08-30 ENCOUNTER — Encounter (HOSPITAL_COMMUNITY): Payer: Self-pay

## 2020-08-30 ENCOUNTER — Encounter (HOSPITAL_COMMUNITY)
Admission: RE | Admit: 2020-08-30 | Discharge: 2020-08-30 | Disposition: A | Discharge status: Home / Self Care | Payer: Medicare (Managed Care) | Source: Ambulatory Visit | Attending: Nephrology | Admitting: Nephrology

## 2020-08-30 DIAGNOSIS — N184 Chronic kidney disease, stage 4 (severe): Secondary | ICD-10-CM | POA: Diagnosis not present

## 2020-08-30 MED ORDER — EPOETIN ALFA 3000 UNIT/ML IJ SOLN: 3000.0000 [IU] | Freq: Once | INTRAMUSCULAR | Status: DC

## 2020-08-31 ENCOUNTER — Ambulatory Visit: Payer: Medicare (Managed Care) | Admitting: Nurse Practitioner

## 2020-08-31 NOTE — Progress Notes (Deleted)
Referring Provider: [name], MD Primary Care Physician:  [name], MD Primary GI:  Dr. Abbey Chatters  No chief complaint on file.   HPI:   Eric Scott is a 70 y.o. male who presents for hospital follow-up.  Last seen in our office 08/08/2018 with history of C. difficile diarrhea, upper GI bleed, acute gastric erosions, duodenal ulcer.  Previous hospitalization requiring transfusion for hemoglobin of 6.6.  At his last visit he was doing well without any significant GI symptoms.  At that time recommended continue current medications, notify us of any worsening or recurrent bleeding, follow-up as needed.  The patient was admitted to the hospital from 05/11/2020 through 05/14/2020 for Klebsiella bacteremia felt likely from biliary origin.  History of chronic kidney disease stage IV, diabetes, hypertension, chronic diastolic congestive heart failure.  He presented with dyspnea but without pneumonia and the following day had positive blood cultures with Klebsiella and revised to return to the ED.  He admitted fever at that time.  Emergency room work-up documented elevated LFTs and imaging indicated cholelithiasis without evidence of choledocholithiasis.  During his hospital course he was treated with Rocephin and transition to Keflex.  Did not have CBD dilation.  There is possibility of a passed stone with transient cholangitis.  His LFTs improved during his hospitalization.  Surgery deemed no need for cholecystectomy because he was a high risk surgical candidate and if noted recurrent abdominal pain and elevated LFTs and cholecystectomy would be revisited at that time.  On admission his LFTs were elevated with AST/ALT of 44/48, alk phos of 155, total bilirubin of 3.9.  On the day of discharge this improved to AST/ALT of 112/88, alk phos of 139, total bilirubin of 1.6.  Abdominal CT imaging consistent with hepatic steatosis.  CBD during hospitalization normal at 6 mm.  Today states he doing okay  overall.  Past Medical History:  Diagnosis Date  . Anemia   . Anxiety   . Chronic pain    legs, back; MRI 05/2012 with mild thoracic degenerative changes no spinal stenosis   . 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 COPD (chronic obstructive pulmonary disease) ([place])   . COVID-19   . Depression   . Diabetic peripheral neuropathy ([place])    "chronic" (05/27/2012)  . Diastolic CHF ([place])   . Duodenal ulcer hemorrhage   . DVT (deep venous thrombosis) ([name])    right upper arm  . GERD (gastroesophageal reflux disease)   . Hypercholesteremia   . Hypertension   . Iron deficiency anemia   . Osteomyelitis of ankle ([place])   . Peripheral edema   . PVD (peripheral vascular disease) ([place])   . Renal disorder   . Spinal stenosis    mild lumbar (MRI 05/2012)-L2-L3 to L4-L5 , mild lumbar foraminal stenosis   . Stroke ([place]) 05/2012   Subacute, lacunar infarcts within the left basal ganglia and posterior limp of the left internal capsule/thalamus; "RUE; both feet weak" (05/27/2012)  . Tremor   . Type II diabetes mellitus ([place])     Past Surgical History:  Procedure Laterality Date  . ANKLE SURGERY    . ESOPHAGOGASTRODUODENOSCOPY (EGD) WITH PROPOFOL N/A 12/21/2016   [place]: Ulcerative reflux esophagitis, erosive gastropathy, extensive duodenal ulceration likely site of bleeding.  Pathology with mild gastritis, no H. pylori  . ESOPHAGOGASTRODUODENOSCOPY (EGD) WITH PROPOFOL N/A 04/02/2017   Fields: ESOPAHGITIS/GASTRIC AND DUODENAL ULCERS HEALED. mild duodenitis  . HERNIA REPAIR  01/05/2004   "belly button" (  05/27/2012)    Current Outpatient Medications  Medication Sig Dispense Refill  . acetaminophen (TYLENOL) 325 MG tablet Take 2 tablets (650 mg total) by mouth every 6 (six) hours as needed for mild pain, fever or headache (or Fever >/= 101). 12 tablet 0  . albuterol (PROVENTIL) (2.5 MG/3ML) 0.083% nebulizer solution Take 2.5 mg by nebulization in the morning and at bedtime.     Marland Kitchen  allopurinol (ZYLOPRIM) 100 MG tablet Take 1 tablet by mouth daily. For gout    . ALPRAZolam (XANAX) 0.5 MG tablet Take 0.5 mg by mouth at bedtime as needed for anxiety.    Marland Kitchen amLODipine (NORVASC) 10 MG tablet Take 1 tablet (10 mg total) by mouth daily. 30 tablet 2  . aspirin EC 81 MG tablet Take 1 tablet (81 mg total) by mouth daily with breakfast. 30 tablet 11  . BREO ELLIPTA 100-25 MCG/INH AEPB Inhale 1 puff into the lungs daily.     . calcitRIOL (ROCALTROL) 0.25 MCG capsule Take 0.25 mcg by mouth daily.    . calcium carbonate (TUMS - DOSED IN MG ELEMENTAL CALCIUM) 500 MG chewable tablet Chew 2 tablets by mouth 2 (two) times daily.    . cholecalciferol (VITAMIN D) 1000 units tablet Take 5,000 Units by mouth daily.     . cyanocobalamin 1000 MCG tablet Take 1,000 mcg by mouth daily.    Marland Kitchen epoetin alfa (EPOGEN) 3000 UNIT/ML injection Inject 3,000 Units into the vein every 14 (fourteen) days.    . fish oil-omega-3 fatty acids 1000 MG capsule Take 1 g by mouth 2 (two) times daily.    Marland Kitchen FLUoxetine (PROZAC) 20 MG capsule Take 20 mg by mouth daily.    Marland Kitchen gabapentin (NEURONTIN) 300 MG capsule Take 1 capsule (300 mg total) by mouth at bedtime. 30 capsule 1  . GUAIFENESIN PO Take 600 mg by mouth.    Marland Kitchen HYDROcodone-acetaminophen (NORCO/VICODIN) 5-325 MG tablet Take 1 tablet by mouth every 12 (twelve) hours as needed for moderate pain or severe pain. (Patient taking differently: Take 1 tablet by mouth every 6 (six) hours as needed for moderate pain or severe pain. ) 15 tablet 0  . insulin aspart (NOVOLOG) 100 UNIT/ML injection Inject 2-14 Units into the skin 3 (three) times daily before meals. Sliding scale 150-200=2units, 201-250=4units, 251-300=6units, 301-349=8units, 350-400=10 units, 401-450=12units, 451-500=14units before meals and at betime    . lactulose (CHRONULAC) 10 GM/15ML solution Take 15 mLs (10 g total) by mouth See admin instructions. --Please give 7m [10 g total] every Monday and Friday due to  tendency for high ammonia 236 mL 1  . LANTUS 100 UNIT/ML injection Inject 32 Units into the skin at bedtime.     . Lidocaine 4 % PTCH Apply 1 patch topically daily.    . magnesium oxide (MAG-OX) 400 MG tablet Take 400 mg by mouth daily.    . metoprolol tartrate (LOPRESSOR) 50 MG tablet Take 50 mg by mouth 2 (two) times daily.    . Multiple Vitamin (MULTIVITAMIN) tablet Take 1 tablet by mouth daily. For wound healing    . ondansetron (ZOFRAN) 4 MG tablet Take 4 mg by mouth every 8 (eight) hours as needed.    . pantoprazole (PROTONIX) 40 MG tablet Take 1 tablet (40 mg total) by mouth daily before breakfast. 30 tablet 0  . polyethylene glycol (MIRALAX / GLYCOLAX) 17 g packet Take 17 g by mouth daily. For constipation    . potassium chloride 20 MEQ TBCR Take 20 mEq by mouth  daily.    . senna (SENOKOT) 8.6 MG tablet Take by mouth.    . tamsulosin (FLOMAX) 0.4 MG CAPS capsule Take 1 capsule (0.4 mg total) by mouth daily after supper. 30 capsule 2  . torsemide (DEMADEX) 20 MG tablet Take 2 tablets (40 mg total) by mouth 2 (two) times daily. (Patient taking differently: Take 60 mg by mouth 2 (two) times daily. )     No current facility-administered medications for this visit.    Allergies as of 08/31/2020 - Review Complete 08/30/2020  Allergen Reaction Noted  . Blueberry flavor Other (See Comments) 05/29/2016  . Cucumber extract Other (See Comments) 08/01/2012  . Flexeril [cyclobenzaprine hcl] Other (See Comments) 07/20/2010  . Kiwi extract Other (See Comments) 08/01/2012    Family History  Problem Relation Age of Onset  . Colon cancer Neg Hx     Social History   Socioeconomic History  . Marital status: Legally Separated    Spouse name: Not on file  . Number of children: Not on file  . Years of education: Not on file  . Highest education level: Not on file  Occupational History  . Not on file  Tobacco Use  . Smoking status: Former Smoker    Packs/day: 2.00    Years: 54.00    Pack  years: 108.00    Types: Cigarettes    Quit date: 12/12/2019    Years since quitting: 0.7  . Smokeless tobacco: Never Used  Vaping Use  . Vaping Use: Never used  Substance and Sexual Activity  . Alcohol use: No    Comment: 05/27/2012 "drank gallons and gallons 20 yr ago or so; last drink  at least 10 yr ago"  . Drug use: No  . Sexual activity: Not Currently  Other Topics Concern  . Not on file  Social History Narrative   Pt is living at [place] for 6 years now    He is divorced. Has one kid.   Not working for about 7 years now. Used to do plumbing work.   Social Determinants of Health   Financial Resource Strain: Not on file  Food Insecurity: Not on file  Transportation Needs: Not on file  Physical Activity: Not on file  Stress: Not on file  Social Connections: Not on file    Subjective:*** Review of Systems  Constitutional: Negative for chills, fever, malaise/fatigue and weight loss.  HENT: Negative for congestion and sore throat.   Respiratory: Negative for cough and shortness of breath.   Cardiovascular: Negative for chest pain and palpitations.  Gastrointestinal: Negative for abdominal pain, blood in stool, diarrhea, melena, nausea and vomiting.  Musculoskeletal: Negative for joint pain and myalgias.  Skin: Negative for rash.  Neurological: Negative for dizziness and weakness.  Endo/Heme/Allergies: Does not bruise/bleed easily.  Psychiatric/Behavioral: Negative for depression. The patient is not nervous/anxious.   All other systems reviewed and are negative.    Objective: There were no vitals taken for this visit. Physical Exam Vitals and nursing note reviewed.  Constitutional:      General: He is not in acute distress.    Appearance: Normal appearance. He is not ill-appearing, toxic-appearing or diaphoretic.  HENT:     Head: Normocephalic and atraumatic.     Nose: No congestion or rhinorrhea.  Eyes:     General: No scleral  icterus. Cardiovascular:     Rate and Rhythm: Normal rate and regular rhythm.     Heart sounds: Normal heart sounds.  Pulmonary:  Effort: Pulmonary effort is normal.     Breath sounds: Normal breath sounds.  Abdominal:     General: Bowel sounds are normal. There is no distension.     Palpations: Abdomen is soft. There is no hepatomegaly, splenomegaly or mass.     Tenderness: There is no abdominal tenderness. There is no guarding or rebound.     Hernia: No hernia is present.  Musculoskeletal:     Cervical back: Neck supple.  Skin:    General: Skin is warm and dry.     Coloration: Skin is not jaundiced.     Findings: No bruising or rash.  Neurological:     General: No focal deficit present.     Mental Status: He is alert and oriented to person, place, and time. Mental status is at baseline.  Psychiatric:        Mood and Affect: Mood normal.        Behavior: Behavior normal.        Thought Content: Thought content normal.      Assessment:  ***   Plan: ***    Thank you for allowing [place] to participate in the care of [place], DNP, AGNP-C Adult & Gerontological Nurse Practitioner [facility]   08/31/2020 7:38 AM   Disclaimer: This note was dictated with voice recognition software. Similar sounding words can inadvertently be transcribed and may not be corrected upon review.

## 2020-09-12 ENCOUNTER — Encounter: Payer: Self-pay | Admitting: Gastroenterology

## 2020-09-13 ENCOUNTER — Encounter (HOSPITAL_COMMUNITY): Admission: RE | Admit: 2020-09-13 | Payer: Medicare (Managed Care) | Source: Ambulatory Visit

## 2020-09-14 ENCOUNTER — Encounter (HOSPITAL_COMMUNITY)
Admission: RE | Admit: 2020-09-14 | Discharge: 2020-09-14 | Disposition: A | Discharge status: Home / Self Care | Payer: Medicare (Managed Care) | Source: Ambulatory Visit | Attending: Nephrology | Admitting: Nephrology

## 2020-09-14 ENCOUNTER — Encounter (HOSPITAL_COMMUNITY): Payer: Self-pay

## 2020-09-14 ENCOUNTER — Other Ambulatory Visit: Payer: Self-pay

## 2020-09-14 DIAGNOSIS — D631 Anemia in chronic kidney disease: Secondary | ICD-10-CM | POA: Insufficient documentation

## 2020-09-14 DIAGNOSIS — N184 Chronic kidney disease, stage 4 (severe): Secondary | ICD-10-CM | POA: Diagnosis present

## 2020-09-14 LAB — RENAL FUNCTION PANEL
Albumin: 3.3 g/dL — ABNORMAL LOW (ref 3.5–5.0)
Anion gap: 13 (ref 5–15)
BUN: 60 mg/dL — ABNORMAL HIGH (ref 8–23)
CO2: 25 mmol/L (ref 22–32)
Calcium: 8.9 mg/dL (ref 8.9–10.3)
Chloride: 98 mmol/L (ref 98–111)
Creatinine, Ser: 4.09 mg/dL — ABNORMAL HIGH (ref 0.61–1.24)
GFR, Estimated: 15 mL/min — ABNORMAL LOW (ref >60)
Glucose, Bld: 217 mg/dL — ABNORMAL HIGH (ref 70–99)
Phosphorus: 4.3 mg/dL (ref 2.5–4.6)
Potassium: 4.3 mmol/L (ref 3.5–5.1)
Sodium: 136 mmol/L (ref 135–145)

## 2020-09-14 LAB — IRON AND TIBC
Iron: 32 ug/dL — ABNORMAL LOW (ref 45–182)
Saturation Ratios: 13 % — ABNORMAL LOW (ref 17.9–39.5)
TIBC: 251 ug/dL (ref 250–450)
UIBC: 219 ug/dL

## 2020-09-14 LAB — CBC
HCT: 33.2 % — ABNORMAL LOW (ref 39.0–52.0)
Hemoglobin: 11 g/dL — ABNORMAL LOW (ref 13.0–17.0)
MCH: 31.5 pg (ref 26.0–34.0)
MCHC: 33.1 g/dL (ref 30.0–36.0)
MCV: 95.1 fL (ref 80.0–100.0)
Platelets: 272 10*3/uL (ref 150–400)
RBC: 3.49 MIL/uL — ABNORMAL LOW (ref 4.22–5.81)
RDW: 14 % (ref 11.5–15.5)
WBC: 10.5 10*3/uL (ref 4.0–10.5)
nRBC: 0 % (ref 0.0–0.2)

## 2020-09-14 MED ORDER — EPOETIN ALFA 3000 UNIT/ML IJ SOLN: 3000.0000 [IU] | Freq: Once | INTRAMUSCULAR | Status: DC

## 2020-09-15 LAB — PTH, INTACT AND CALCIUM
Calcium, Total (PTH): 9.2 mg/dL (ref 8.6–10.2)
PTH: 36 pg/mL (ref 15–65)

## 2020-09-15 LAB — POCT HEMOGLOBIN-HEMACUE: Hemoglobin: 10.9 g/dL — ABNORMAL LOW (ref 13.0–17.0)

## 2020-09-19 LAB — POCT HEMOGLOBIN-HEMACUE: Hemoglobin: 11 g/dL — ABNORMAL LOW (ref 13.0–17.0)

## 2020-09-28 ENCOUNTER — Encounter (HOSPITAL_COMMUNITY)
Admission: RE | Admit: 2020-09-28 | Discharge: 2020-09-28 | Disposition: A | Discharge status: Home / Self Care | Payer: Medicare (Managed Care) | Source: Ambulatory Visit | Attending: Nephrology | Admitting: Nephrology

## 2020-09-28 ENCOUNTER — Other Ambulatory Visit: Payer: Self-pay

## 2020-09-28 DIAGNOSIS — N184 Chronic kidney disease, stage 4 (severe): Secondary | ICD-10-CM | POA: Diagnosis not present

## 2020-09-28 LAB — POCT HEMOGLOBIN-HEMACUE: Hemoglobin: 11.3 g/dL — ABNORMAL LOW (ref 13.0–17.0)

## 2020-10-12 ENCOUNTER — Other Ambulatory Visit: Payer: Self-pay

## 2020-10-12 ENCOUNTER — Ambulatory Visit: Payer: Medicare (Managed Care) | Admitting: Nurse Practitioner

## 2020-10-12 ENCOUNTER — Encounter (HOSPITAL_COMMUNITY)
Admission: RE | Admit: 2020-10-12 | Discharge: 2020-10-12 | Disposition: A | Discharge status: Home / Self Care | Payer: Medicare (Managed Care) | Source: Ambulatory Visit | Attending: Nephrology | Admitting: Nephrology

## 2020-10-12 DIAGNOSIS — N184 Chronic kidney disease, stage 4 (severe): Secondary | ICD-10-CM | POA: Insufficient documentation

## 2020-10-12 LAB — POCT HEMOGLOBIN-HEMACUE: Hemoglobin: 12.6 g/dL — ABNORMAL LOW (ref 13.0–17.0)

## 2020-10-12 LAB — RENAL FUNCTION PANEL
Albumin: 3.6 g/dL (ref 3.5–5.0)
Anion gap: 10 (ref 5–15)
BUN: 83 mg/dL — ABNORMAL HIGH (ref 8–23)
CO2: 30 mmol/L (ref 22–32)
Calcium: 9.5 mg/dL (ref 8.9–10.3)
Chloride: 94 mmol/L — ABNORMAL LOW (ref 98–111)
Creatinine, Ser: 4.88 mg/dL — ABNORMAL HIGH (ref 0.61–1.24)
GFR, Estimated: 12 mL/min — ABNORMAL LOW (ref >60)
Glucose, Bld: 205 mg/dL — ABNORMAL HIGH (ref 70–99)
Phosphorus: 4.1 mg/dL (ref 2.5–4.6)
Potassium: 4.8 mmol/L (ref 3.5–5.1)
Sodium: 134 mmol/L — ABNORMAL LOW (ref 135–145)

## 2020-10-12 LAB — CBC
HCT: 38.4 % — ABNORMAL LOW (ref 39.0–52.0)
Hemoglobin: 12.3 g/dL — ABNORMAL LOW (ref 13.0–17.0)
MCH: 31.5 pg (ref 26.0–34.0)
MCHC: 32 g/dL (ref 30.0–36.0)
MCV: 98.2 fL (ref 80.0–100.0)
Platelets: 245 10*3/uL (ref 150–400)
RBC: 3.91 MIL/uL — ABNORMAL LOW (ref 4.22–5.81)
RDW: 14.1 % (ref 11.5–15.5)
WBC: 12.4 10*3/uL — ABNORMAL HIGH (ref 4.0–10.5)
nRBC: 0 % (ref 0.0–0.2)

## 2020-10-12 LAB — IRON AND TIBC
Iron: 47 ug/dL (ref 45–182)
Saturation Ratios: 18 % (ref 17.9–39.5)
TIBC: 263 ug/dL (ref 250–450)
UIBC: 216 ug/dL

## 2020-10-12 MED ORDER — EPOETIN ALFA 3000 UNIT/ML IJ SOLN: 3000.0000 [IU] | INTRAMUSCULAR | Status: DC

## 2020-10-12 NOTE — Progress Notes (Signed)
Pt states unable to give urine specimen for protein/CREAT ratio.

## 2020-10-13 LAB — PTH, INTACT AND CALCIUM
Calcium, Total (PTH): 9.9 mg/dL (ref 8.6–10.2)
PTH: 18 pg/mL (ref 15–65)

## 2020-10-23 NOTE — Progress Notes (Deleted)
Referring Provider: [name], MD Primary Care Physician:  [name], MD Primary GI Physician: Dr. Gala Romney  No chief complaint on file.   HPI:   Eric Scott is a 70 y.o. male presenting today with a history of C. difficile diarrhea, upper GI bleed in the setting of ulcerative reflux esophagitis, gastric erosion, and duodenal ulcers in 2018 presenting today for hospital follow-up of cholangitis.  Patient was hospitalized 05/11/2020-05/14/20 with ongoing right upper quadrant pain, acutely elevated LFTs, positive blood cultures with clinical picture suggestive of mild cholangitis possibly related to recent impacted stone/Mirizzi syndrome or passed stone.  Imaging including MRI/MRCP revealed cholelithiasis but no evidence of choledocholithiasis or cholecystitis.  He was treated supportively with antibiotics and symptoms improved.  Surgery was consulted, but recommended holding off on cholecystectomy as he was high risk for surgical intervention due to comorbidities.  Recommended if symptoms occur again, return the emergency room and addressed a cholecystectomy at that time.  LFTs on day of discharge: AST 102, ALT 88, alk phos 139, total bilirubin 1.6.  This is compared to day of admission with AST 44, ALT 48, alk phos 155, total bilirubin 3.9.  No repeat LFTs since hospital discharge in our system.  Today:     Past Medical History:  Diagnosis Date  . Anemia   . Anxiety   . Chronic pain    legs, back; MRI 05/2012 with mild thoracic degenerative changes no spinal stenosis   . 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 COPD (chronic obstructive pulmonary disease) ([place])   . COVID-19   . Depression   . Diabetic peripheral neuropathy ([place])    "chronic" (05/27/2012)  . Diastolic CHF ([place])   . Duodenal ulcer hemorrhage   . DVT (deep venous thrombosis) ([name])    right upper arm  . GERD (gastroesophageal reflux disease)   . Hypercholesteremia   . Hypertension   . Iron  deficiency anemia   . Osteomyelitis of ankle ([place])   . Peripheral edema   . PVD (peripheral vascular disease) ([place])   . Renal disorder   . Spinal stenosis    mild lumbar (MRI 05/2012)-L2-L3 to L4-L5 , mild lumbar foraminal stenosis   . Stroke ([place]) 05/2012   Subacute, lacunar infarcts within the left basal ganglia and posterior limp of the left internal capsule/thalamus; "RUE; both feet weak" (05/27/2012)  . Tremor   . Type II diabetes mellitus ([place])     Past Surgical History:  Procedure Laterality Date  . ANKLE SURGERY    . ESOPHAGOGASTRODUODENOSCOPY (EGD) WITH PROPOFOL N/A 12/21/2016   [place]: Ulcerative reflux esophagitis, erosive gastropathy, extensive duodenal ulceration likely site of bleeding.  Pathology with mild gastritis, no H. pylori  . ESOPHAGOGASTRODUODENOSCOPY (EGD) WITH PROPOFOL N/A 04/02/2017   Fields: ESOPAHGITIS/GASTRIC AND DUODENAL ULCERS HEALED. mild duodenitis  . HERNIA REPAIR  01/05/2004   "belly button" (05/27/2012)    Current Outpatient Medications  Medication Sig Dispense Refill  . acetaminophen (TYLENOL) 325 MG tablet Take 2 tablets (650 mg total) by mouth every 6 (six) hours as needed for mild pain, fever or headache (or Fever >/= 101). 12 tablet 0  . albuterol (PROVENTIL) (2.5 MG/3ML) 0.083% nebulizer solution Take 2.5 mg by nebulization in the morning and at bedtime.     Marland Kitchen allopurinol (ZYLOPRIM) 100 MG tablet Take 1 tablet by mouth daily. For gout    . ALPRAZolam (XANAX) 0.5 MG tablet Take 0.5 mg by mouth at bedtime as needed for anxiety.    Marland Kitchen  amLODipine (NORVASC) 10 MG tablet Take 1 tablet (10 mg total) by mouth daily. 30 tablet 2  . aspirin EC 81 MG tablet Take 1 tablet (81 mg total) by mouth daily with breakfast. 30 tablet 11  . BREO ELLIPTA 100-25 MCG/INH AEPB Inhale 1 puff into the lungs daily.     . calcitRIOL (ROCALTROL) 0.25 MCG capsule Take 0.25 mcg by mouth daily.    . calcium carbonate (TUMS - DOSED IN MG ELEMENTAL CALCIUM) 500 MG chewable tablet  Chew 2 tablets by mouth 2 (two) times daily.    . cholecalciferol (VITAMIN D) 1000 units tablet Take 5,000 Units by mouth daily.     . cyanocobalamin 1000 MCG tablet Take 1,000 mcg by mouth daily.    Marland Kitchen epoetin alfa (EPOGEN) 3000 UNIT/ML injection Inject 3,000 Units into the vein every 14 (fourteen) days.    . fish oil-omega-3 fatty acids 1000 MG capsule Take 1 g by mouth 2 (two) times daily.    Marland Kitchen FLUoxetine (PROZAC) 20 MG capsule Take 20 mg by mouth daily.    Marland Kitchen gabapentin (NEURONTIN) 300 MG capsule Take 1 capsule (300 mg total) by mouth at bedtime. 30 capsule 1  . GUAIFENESIN PO Take 600 mg by mouth.    Marland Kitchen HYDROcodone-acetaminophen (NORCO/VICODIN) 5-325 MG tablet Take 1 tablet by mouth every 12 (twelve) hours as needed for moderate pain or severe pain. (Patient taking differently: Take 1 tablet by mouth every 6 (six) hours as needed for moderate pain or severe pain. ) 15 tablet 0  . insulin aspart (NOVOLOG) 100 UNIT/ML injection Inject 2-14 Units into the skin 3 (three) times daily before meals. Sliding scale 150-200=2units, 201-250=4units, 251-300=6units, 301-349=8units, 350-400=10 units, 401-450=12units, 451-500=14units before meals and at betime    . lactulose (CHRONULAC) 10 GM/15ML solution Take 15 mLs (10 g total) by mouth See admin instructions. --Please give 68m [10 g total] every Monday and Friday due to tendency for high ammonia 236 mL 1  . LANTUS 100 UNIT/ML injection Inject 32 Units into the skin at bedtime.     . Lidocaine 4 % PTCH Apply 1 patch topically daily.    . magnesium oxide (MAG-OX) 400 MG tablet Take 400 mg by mouth daily.    . metoprolol tartrate (LOPRESSOR) 50 MG tablet Take 50 mg by mouth 2 (two) times daily.    . Multiple Vitamin (MULTIVITAMIN) tablet Take 1 tablet by mouth daily. For wound healing    . ondansetron (ZOFRAN) 4 MG tablet Take 4 mg by mouth every 8 (eight) hours as needed.    . pantoprazole (PROTONIX) 40 MG tablet Take 1 tablet (40 mg total) by mouth daily  before breakfast. 30 tablet 0  . polyethylene glycol (MIRALAX / GLYCOLAX) 17 g packet Take 17 g by mouth daily. For constipation    . potassium chloride 20 MEQ TBCR Take 20 mEq by mouth daily.    .Marland Kitchensenna (SENOKOT) 8.6 MG tablet Take by mouth.    . tamsulosin (FLOMAX) 0.4 MG CAPS capsule Take 1 capsule (0.4 mg total) by mouth daily after supper. 30 capsule 2  . torsemide (DEMADEX) 20 MG tablet Take 2 tablets (40 mg total) by mouth 2 (two) times daily. (Patient taking differently: Take 60 mg by mouth 2 (two) times daily.)     No current facility-administered medications for this visit.    Allergies as of 10/24/2020 - Review Complete 09/28/2020  Allergen Reaction Noted  . Blueberry flavor Other (See Comments) 05/29/2016  . Cucumber extract Other (See  Comments) 08/01/2012  . Flexeril [cyclobenzaprine hcl] Other (See Comments) 07/20/2010  . Kiwi extract Other (See Comments) 08/01/2012    Family History  Problem Relation Age of Onset  . Colon cancer Neg Hx     Social History   Socioeconomic History  . Marital status: Legally Separated    Spouse name: Not on file  . Number of children: Not on file  . Years of education: Not on file  . Highest education level: Not on file  Occupational History  . Not on file  Tobacco Use  . Smoking status: Former Smoker    Packs/day: 2.00    Years: 54.00    Pack years: 108.00    Types: Cigarettes    Quit date: 12/12/2019    Years since quitting: 0.8  . Smokeless tobacco: Never Used  Vaping Use  . Vaping Use: Never used  Substance and Sexual Activity  . Alcohol use: No    Comment: 05/27/2012 "drank gallons and gallons 20 yr ago or so; last drink  at least 10 yr ago"  . Drug use: No  . Sexual activity: Not Currently  Other Topics Concern  . Not on file  Social History Narrative   Pt is living at [facility] for 6 years now    He is divorced. Has one kid.   Not working for about 7 years now. Used to do plumbing work.   Social  Determinants of Health   Financial Resource Strain: Not on file  Food Insecurity: Not on file  Transportation Needs: Not on file  Physical Activity: Not on file  Stress: Not on file  Social Connections: Not on file    Review of Systems: Gen: Denies fever, chills, anorexia. Denies fatigue, weakness, weight loss.  CV: Denies chest pain, palpitations, syncope, peripheral edema, and claudication. Resp: Denies dyspnea at rest, cough, wheezing, coughing up blood, and pleurisy. GI: Denies vomiting blood, jaundice, and fecal incontinence.   Denies dysphagia or odynophagia. Derm: Denies rash, itching, dry skin Psych: Denies depression, anxiety, memory loss, confusion. No homicidal or suicidal ideation.  Heme: Denies bruising, bleeding, and enlarged lymph nodes.  Physical Exam: There were no vitals taken for this visit. General:   Alert and oriented. No distress noted. Pleasant and cooperative.  Head:  Normocephalic and atraumatic. Eyes:  Conjuctiva clear without scleral icterus. Mouth:  Oral mucosa pink and moist. Good dentition. No lesions. Heart:  S1, S2 present without murmurs appreciated. Lungs:  Clear to auscultation bilaterally. No wheezes, rales, or rhonchi. No distress.  Abdomen:  +BS, soft, non-tender and non-distended. No rebound or guarding. No HSM or masses noted. Msk:  Symmetrical without gross deformities. Normal posture. Extremities:  Without edema. Neurologic:  Alert and  oriented x4 Psych:  Alert and cooperative. Normal mood and affect.

## 2020-10-24 ENCOUNTER — Ambulatory Visit: Payer: Medicare (Managed Care) | Admitting: Gastroenterology

## 2020-10-26 ENCOUNTER — Other Ambulatory Visit: Payer: Self-pay

## 2020-10-26 ENCOUNTER — Encounter (HOSPITAL_COMMUNITY)
Admission: RE | Admit: 2020-10-26 | Discharge: 2020-10-26 | Disposition: A | Discharge status: Home / Self Care | Payer: Medicare (Managed Care) | Source: Ambulatory Visit | Attending: Nephrology | Admitting: Nephrology

## 2020-10-26 DIAGNOSIS — N184 Chronic kidney disease, stage 4 (severe): Secondary | ICD-10-CM | POA: Diagnosis not present

## 2020-10-26 LAB — POCT HEMOGLOBIN-HEMACUE: Hemoglobin: 11.9 g/dL — ABNORMAL LOW (ref 13.0–17.0)

## 2020-11-09 ENCOUNTER — Other Ambulatory Visit: Payer: Self-pay

## 2020-11-09 ENCOUNTER — Encounter (HOSPITAL_COMMUNITY): Payer: Self-pay

## 2020-11-09 ENCOUNTER — Encounter (HOSPITAL_COMMUNITY)
Admission: RE | Admit: 2020-11-09 | Discharge: 2020-11-09 | Disposition: A | Discharge status: Home / Self Care | Payer: Medicare (Managed Care) | Source: Ambulatory Visit | Attending: Nephrology | Admitting: Nephrology

## 2020-11-09 DIAGNOSIS — N184 Chronic kidney disease, stage 4 (severe): Secondary | ICD-10-CM | POA: Diagnosis present

## 2020-11-09 LAB — POCT HEMOGLOBIN-HEMACUE: Hemoglobin: 13.4 g/dL (ref 13.0–17.0)

## 2020-11-09 MED ORDER — EPOETIN ALFA 3000 UNIT/ML IJ SOLN: 3000.0000 [IU] | Freq: Once | INTRAMUSCULAR | Status: DC

## 2020-11-16 IMAGING — DX DG CHEST 1V PORT
2 series · 2 of 2 positions shown · non-contrast
Comparison: Radiograph 05/18/2019, additional priors

CLINICAL DATA: Shortness of breath.

EXAM:
PORTABLE CHEST 1 VIEW

[chest ap grid (1 of 2)]
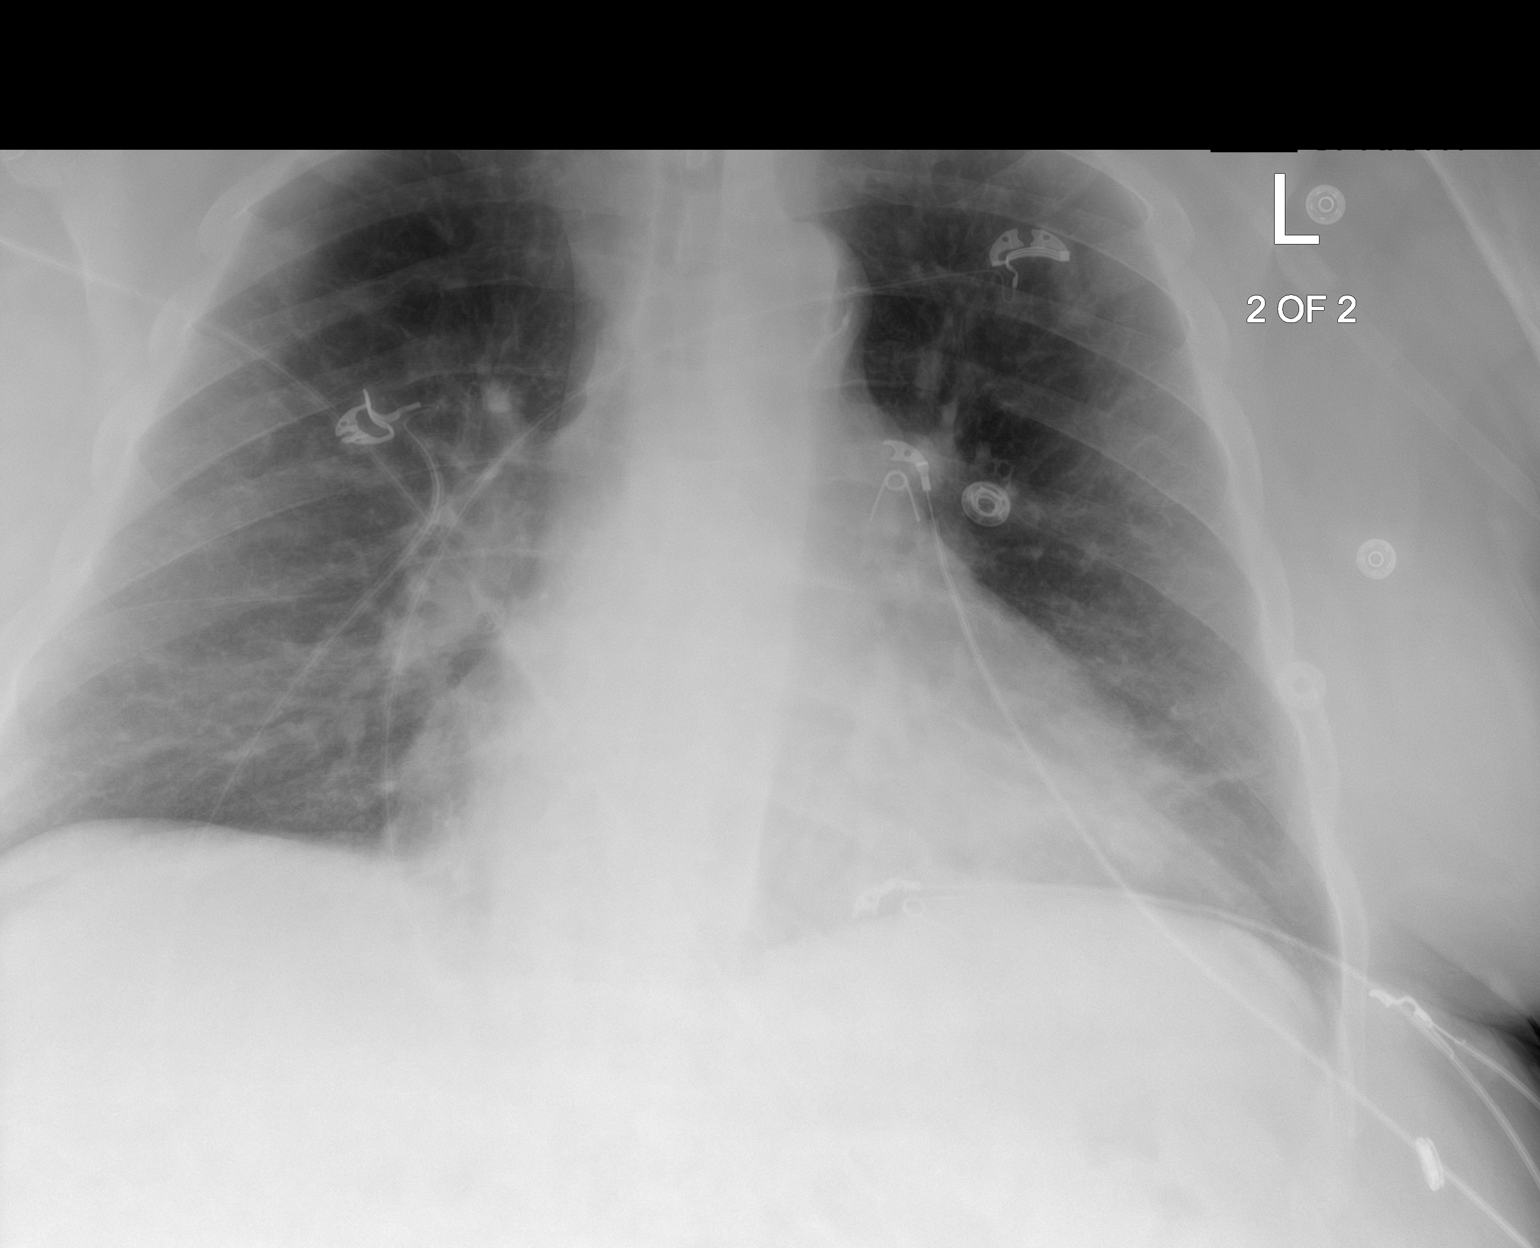

[chest ap grid (2 of 2)]
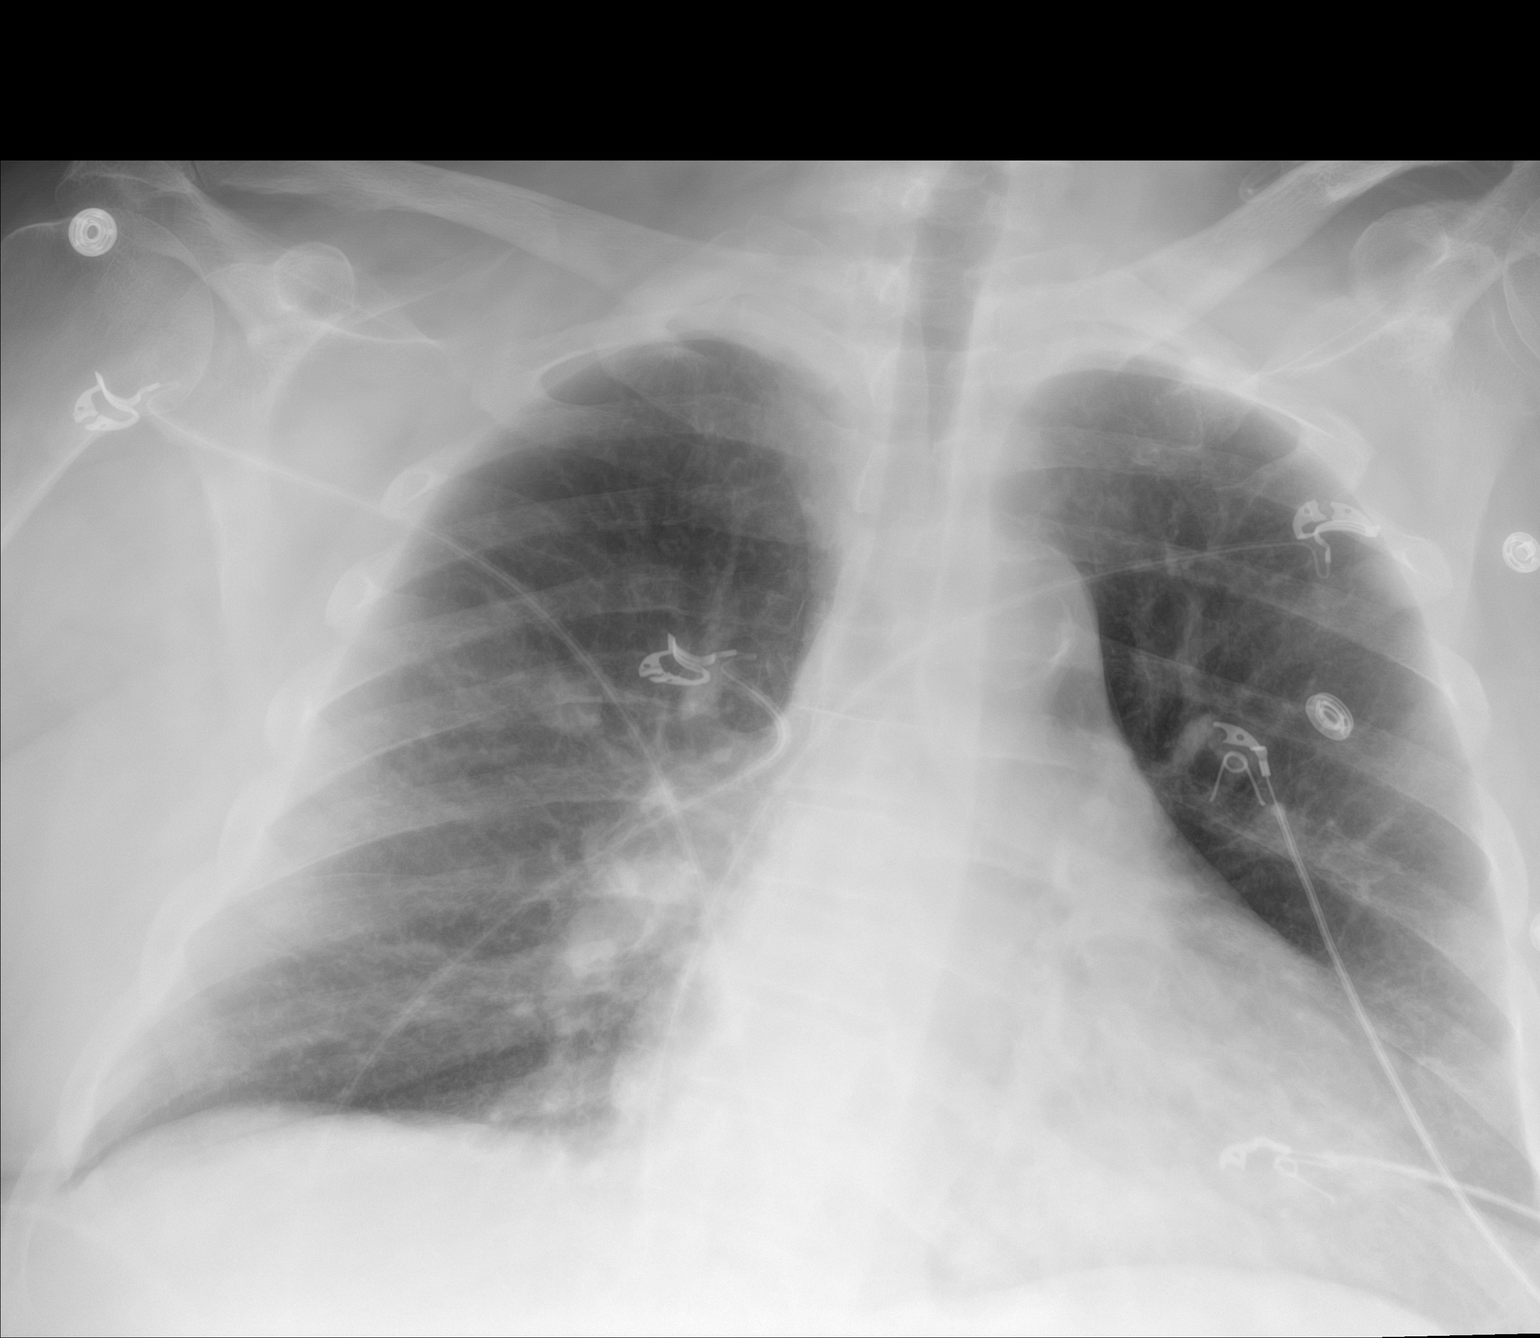

[2 of 2 positions shown; findings below may reference images not displayed]

FINDINGS: Stable cardiomegaly. Aortic atherosclerosis. Improved vascular
congestion and pulmonary edema from prior. New streaky retrocardiac
opacities. Stable right perihilar nodule dating back to 0800,
considered benign.
IMPRESSION: 1. Stable cardiomegaly. Resolved pulmonary edema with mild residual
vascular congestion since exam 4 days ago.
2. New streaky retrocardiac opacities, favor atelectasis over
pneumonia.
3.  Aortic Atherosclerosis ([id number]-9X6.6).

## 2020-11-23 ENCOUNTER — Other Ambulatory Visit: Payer: Self-pay

## 2020-11-23 ENCOUNTER — Encounter (HOSPITAL_COMMUNITY)
Admission: RE | Admit: 2020-11-23 | Discharge: 2020-11-23 | Disposition: A | Discharge status: Home / Self Care | Payer: Medicare (Managed Care) | Source: Ambulatory Visit | Attending: Nephrology | Admitting: Nephrology

## 2020-11-23 DIAGNOSIS — N184 Chronic kidney disease, stage 4 (severe): Secondary | ICD-10-CM | POA: Diagnosis not present

## 2020-11-23 LAB — CBC WITH DIFFERENTIAL/PLATELET
Abs Immature Granulocytes: 0.14 10*3/uL — ABNORMAL HIGH (ref 0.00–0.07)
Basophils Absolute: 0.1 10*3/uL (ref 0.0–0.1)
Basophils Relative: 1 %
Eosinophils Absolute: 0.9 10*3/uL — ABNORMAL HIGH (ref 0.0–0.5)
Eosinophils Relative: 8 %
HCT: 36 % — ABNORMAL LOW (ref 39.0–52.0)
Hemoglobin: 11.6 g/dL — ABNORMAL LOW (ref 13.0–17.0)
Immature Granulocytes: 1 %
Lymphocytes Relative: 13 %
Lymphs Abs: 1.6 10*3/uL (ref 0.7–4.0)
MCH: 31.6 pg (ref 26.0–34.0)
MCHC: 32.2 g/dL (ref 30.0–36.0)
MCV: 98.1 fL (ref 80.0–100.0)
Monocytes Absolute: 0.5 10*3/uL (ref 0.1–1.0)
Monocytes Relative: 4 %
Neutro Abs: 8.7 10*3/uL — ABNORMAL HIGH (ref 1.7–7.7)
Neutrophils Relative %: 73 %
Platelets: 314 10*3/uL (ref 150–400)
RBC: 3.67 MIL/uL — ABNORMAL LOW (ref 4.22–5.81)
RDW: 14.6 % (ref 11.5–15.5)
WBC: 12 10*3/uL — ABNORMAL HIGH (ref 4.0–10.5)
nRBC: 0 % (ref 0.0–0.2)

## 2020-11-23 LAB — RENAL FUNCTION PANEL
Albumin: 3.1 g/dL — ABNORMAL LOW (ref 3.5–5.0)
Anion gap: 11 (ref 5–15)
BUN: 72 mg/dL — ABNORMAL HIGH (ref 8–23)
CO2: 27 mmol/L (ref 22–32)
Calcium: 8.8 mg/dL — ABNORMAL LOW (ref 8.9–10.3)
Chloride: 97 mmol/L — ABNORMAL LOW (ref 98–111)
Creatinine, Ser: 4.78 mg/dL — ABNORMAL HIGH (ref 0.61–1.24)
GFR, Estimated: 12 mL/min — ABNORMAL LOW (ref >60)
Glucose, Bld: 212 mg/dL — ABNORMAL HIGH (ref 70–99)
Phosphorus: 4.8 mg/dL — ABNORMAL HIGH (ref 2.5–4.6)
Potassium: 4.6 mmol/L (ref 3.5–5.1)
Sodium: 135 mmol/L (ref 135–145)

## 2020-11-23 LAB — POCT HEMOGLOBIN-HEMACUE: Hemoglobin: 11.6 g/dL — ABNORMAL LOW (ref 13.0–17.0)

## 2020-11-23 LAB — IRON AND TIBC
Iron: 45 ug/dL (ref 45–182)
Saturation Ratios: 15 % — ABNORMAL LOW (ref 17.9–39.5)
TIBC: 303 ug/dL (ref 250–450)
UIBC: 258 ug/dL

## 2020-11-23 MED ORDER — EPOETIN ALFA 3000 UNIT/ML IJ SOLN: 3000.0000 [IU] | Freq: Once | INTRAMUSCULAR | Status: DC

## 2020-11-24 LAB — PTH, INTACT AND CALCIUM
Calcium, Total (PTH): 8.9 mg/dL (ref 8.6–10.2)
PTH: 39 pg/mL (ref 15–65)

## 2020-12-02 ENCOUNTER — Ambulatory Visit: Payer: Medicare (Managed Care) | Admitting: Internal Medicine

## 2020-12-07 ENCOUNTER — Encounter (HOSPITAL_COMMUNITY)
Admission: RE | Admit: 2020-12-07 | Discharge: 2020-12-07 | Disposition: A | Discharge status: Home / Self Care | Payer: Medicare (Managed Care) | Source: Ambulatory Visit | Attending: Nephrology | Admitting: Nephrology

## 2020-12-13 ENCOUNTER — Ambulatory Visit: Payer: Medicare (Managed Care) | Admitting: Internal Medicine

## 2020-12-13 NOTE — Progress Notes (Deleted)
Eric Scott, male    DOB: [birth year], 70 y.o.   MRN: [id number]   Brief patient profile:  59 yowm NH pt since around 2014  quit smoking 12/18/19 at admit:  Admit date: 12/18/2019 Discharge date: 12/21/2019 .     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     Discharge Condition: Stable and improved.  Discharge back to skilled nursing facility for further care and rehabilitation.   CODE STATUS: Full code.   Diet recommendation: Heart healthy, low calorie and modified carbohydrate diet.        Filed Weights    12/18/19 1554 12/19/19 0500 12/20/19 0516  Weight: (!) 143.3 kg (!) 143 kg (!) 143.2 kg      History of present illness:  Eric Scott is a 70 y.o. male with medical history significant of anemia of chronic kidney disease, stage IV-V renal failure, type 2 diabetes with nephropathy/neuropathy, morbid obesity, gastroesophageal reflux disease, hypertension, hyperlipidemia, chronic diastolic heart failure and history of peripheral vascular disease; who presented to the hospital secondary to worsening in his breathing and hypoxia.  Per patient reports symptoms started approximately 48 hours prior to admission and continue progressing.  They had not been any fever, chills, diaphoresis, chest pain, nausea, vomiting, abdominal pain, dysuria, hematuria, melena, hematochezia or new focal deficits.  He chronically uses 2-3 L nasal supplementation at baseline 24/7.  He expressed an increase wheezing and intermittent nonproductive coughing spells, bronchodilators at home failed to improve his symptoms.  On the day of admission while wearing his usual  oxygen supplementation he experienced significant worsening in his breathing and EMS was contacted; on their arrival oxygen saturation was in the mid 70s.     ED Course: Patient work-up demonstrating elevated BNP, fluid overload on examination and positive wheezing/crackles on exam.  Chest x-ray with vascular congestion and multifocal infiltrates suggesting CHF versus multifocal pneumonia.  Cultures taken, IV antibiotics started, nebulizer management and steroids provided.  Patient is negative for Covid.  Patient was able to be weaned off CPAP, but requiring 9 L high flow nasal cannula supplementation.  TRH has been contacted today patient for further evaluation and management of acute on chronic respiratory failure with hypoxia.   Hospital Course:  1-acute on chronic respiratory failure with hypoxia -Multifactorial in the setting of COPD exacerbation, bronchiectasis/pneumonia and acute on chronic diastolic heart failure. -Patient expressed breathing is back to baseline, using 2 L nasal cannula supplementation and is speaking in full sentences.  Will discharge back to skilled nursing facility for further care. -Continue to follow daily weights, low-sodium diet and adjusted dose of Demadex at time of discharge. -Oxygen supplementation has been weaned back to his baseline; continue steroids tapering, oral doxycycline (for underlying bronchiectasis), as needed bronchodilators and resumption of Breo Ellipta. -Patient will also continue using flutter valve and incentive respirometer as instructed..   2-type 2 diabetes with chronic renal failure stage 3b -Elevated CBGs appreciated in the setting of the steroids usage -Anticipate resolution of elevated blood sugars once his steroid therapy completed. -Resume home insulin regimen -Modified carbohydrate diet has been encouraged. -Continue minimizing as much as possible the use of nephrotoxic agents; close monitoring renal function trend especially  with  adjusted dose of diuretics is recommended; patient will benefit of follow-up with nephrology service as an outpatient. -GFR appears to be at baseline; slight increase in creatinine level with acute diuresis appreciated.  We might need to tolerate higher levels of creatinine in order to maintain better volume control.   3-essential hypertension -stable and well-controlled -Continue current antihypertensive regimen and follow vital signs. -Heart healthy diet has been ordered/recommended.   4-gastroesophageal flux disease -Continue PPI   5-diabetic polyneuropathy -Continue as needed analgesics and Neurontin.   6-morbid obesity -Body mass index is 42.82 kg/m. -Low calorie diet, portion control and increase physical activity discussed with patient.   7-hyperlipidemia -Continue statins -Heart healthy diet has been encouraged.   8-history of depression/anxiety -Mood appears to be stable -No suicidal ideation or hallucination -Continue the use of Prozac and as needed Xanax as per previous medication regimen..   9-history of gout -No acute flare appreciated -Continue the use of allopurinol.     Procedures: See below for x-ray reports 2-D echo: 1. Left ventricular ejection fraction, by estimation, is 55 to 60%. The  left ventricle has normal function. Left ventricular endocardial border  not optimally defined to evaluate regional wall motion. There is severe  left ventricular hypertrophy. Left  ventricular diastolic parameters are indeterminate.   2. Right ventricular systolic function is normal. The right ventricular  size is normal.   3. The mitral valve was not well visualized. No evidence of mitral valve  regurgitation. No evidence of mitral stenosis.   4. The aortic valve was not well visualized. Aortic valve regurgitation  is not visualized. No aortic stenosis is present.   5. Aortic dilatation noted. 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   6.  Technically difficult study, limited visualization     History of Present Illness  01/26/2020  Pulmonary/ 1st office eval/ Eric Scott / [facility] / SN[facility] for [facility]  Chief Complaint  Patient presents with   Pulmonary Consult    Referred by hospital- former Dr Luan Pulling pt.  He was recently admitted to [facility] 12/18/19 to 12/21/19 with hypoxia. He is on 3lpm o2 24/7.  He states his breathing has improved and he is overall doing well today.   Dyspnea:  Fully bed bound / hoyer needed bed to w/c  Cough: none  Sleep: hospital bed x 30 / same as prior  To admit  SABA use: prn = at hs  02 3lpm 24/7  Rec Breo 100 should be only one click daily  - take first thing in am Only use your albuterol as a rescue medication  Please remember to go to the  x-ray department  @  [facility] for your tests - we will call you with the results when they are available        12/13/2020  f/u ov/[place] office/Eric Scott re:  GOLD ? / AB  02 dep maint on *** No chief complaint on file.   Dyspnea:  *** Cough: *** Sleeping: *** SABA use: *** 02: *** Covid status: *** Lung cancer screening: ***   No obvious day to day or daytime variability or assoc excess/ purulent sputum or mucus plugs or hemoptysis or cp or chest tightness, subjective wheeze or overt sinus or hb symptoms.   *** without nocturnal  or early am exacerbation  of respiratory  c/o's or need for noct saba. Also denies any obvious fluctuation of symptoms with weather or environmental changes or other aggravating or  alleviating factors except as outlined above   No unusual exposure hx or h/o childhood pna/ asthma or knowledge of premature birth.  Current Allergies, Complete Past Medical History, Past Surgical History, Family History, and Social History were reviewed in [employer] record.  ROS  The following are not active complaints unless bolded Hoarseness, sore throat, dysphagia, dental problems, itching, sneezing,  nasal  congestion or discharge of excess mucus or purulent secretions, ear ache,   fever, chills, sweats, unintended wt loss or wt gain, classically pleuritic or exertional cp,  orthopnea pnd or arm/hand swelling  or leg swelling, presyncope, palpitations, abdominal pain, anorexia, nausea, vomiting, diarrhea  or change in bowel habits or change in bladder habits, change in stools or change in urine, dysuria, hematuria,  rash, arthralgias, visual complaints, headache, numbness, weakness or ataxia or problems with walking or coordination,  change in mood or  memory.        No outpatient medications have been marked as taking for the 12/13/20 encounter (Appointment) with Tanda Rockers, MD.          Past Medical History:  Diagnosis Date   Anemia    Anxiety    Chronic pain    legs, back; MRI 05/2012 with mild thoracic degenerative changes no spinal stenosis    CKD (chronic kidney disease) 05/12/2019   Stage IV   COPD (chronic obstructive pulmonary disease) ([place])    COVID-19    Depression    Diabetic peripheral neuropathy ([place])    "chronic" (89/21/1941)   Diastolic CHF ([place])    Duodenal ulcer hemorrhage    DVT (deep venous thrombosis) ([name])    right upper arm   GERD (gastroesophageal reflux disease)    Hypercholesteremia    Hypertension    Iron deficiency anemia    Osteomyelitis of ankle ([name])    Peripheral edema    PVD (peripheral vascular disease) ([place])    Renal disorder    Spinal stenosis    mild lumbar (MRI 05/2012)-L2-L3 to L4-L5 , mild lumbar foraminal stenosis    Stroke ([place]) 05/2012   Subacute, lacunar infarcts within the left basal ganglia and posterior limp of the left internal capsule/thalamus; "RUE; both feet weak" (05/27/2012)   Tremor    Type II diabetes mellitus ([name])        Objective:     Wt Readings from Last 3 Encounters:  11/09/20 279 lb 15.8 oz (127 kg)  10/26/20 279 lb 15.8 oz (127 kg)  09/14/20 280 lb (127 kg)      Vital signs reviewed  12/13/2020  - Note  at rest 02 sats  ***% on ***   General appearance:    ***        Mild bar  Tensely obese     1-2+ pitting LE  edema   Severe dry scaling stasis dermatitis changes both LE***       I personally reviewed images and agree with radiology impression as follows:  CXR:   05/11/20 portable Stable cardiomegaly without evidence of acute or active cardiopulmonary disease.    Assessment

## 2020-12-20 ENCOUNTER — Encounter (HOSPITAL_COMMUNITY)
Admission: RE | Admit: 2020-12-20 | Discharge: 2020-12-20 | Disposition: A | Discharge status: Home / Self Care | Payer: Medicare (Managed Care) | Source: Ambulatory Visit | Attending: Nephrology | Admitting: Nephrology

## 2020-12-20 ENCOUNTER — Other Ambulatory Visit: Payer: Self-pay

## 2020-12-20 DIAGNOSIS — N184 Chronic kidney disease, stage 4 (severe): Secondary | ICD-10-CM | POA: Insufficient documentation

## 2020-12-20 LAB — RENAL FUNCTION PANEL
Albumin: 3.6 g/dL (ref 3.5–5.0)
Anion gap: 9 (ref 5–15)
BUN: 66 mg/dL — ABNORMAL HIGH (ref 8–23)
CO2: 31 mmol/L (ref 22–32)
Calcium: 9.1 mg/dL (ref 8.9–10.3)
Chloride: 98 mmol/L (ref 98–111)
Creatinine, Ser: 4.44 mg/dL — ABNORMAL HIGH (ref 0.61–1.24)
GFR, Estimated: 14 mL/min — ABNORMAL LOW (ref >60)
Glucose, Bld: 179 mg/dL — ABNORMAL HIGH (ref 70–99)
Phosphorus: 3.9 mg/dL (ref 2.5–4.6)
Potassium: 4 mmol/L (ref 3.5–5.1)
Sodium: 138 mmol/L (ref 135–145)

## 2020-12-20 LAB — CBC WITH DIFFERENTIAL/PLATELET
Abs Immature Granulocytes: 0.05 10*3/uL (ref 0.00–0.07)
Basophils Absolute: 0.1 10*3/uL (ref 0.0–0.1)
Basophils Relative: 1 %
Eosinophils Absolute: 1 10*3/uL — ABNORMAL HIGH (ref 0.0–0.5)
Eosinophils Relative: 9 %
HCT: 36.8 % — ABNORMAL LOW (ref 39.0–52.0)
Hemoglobin: 11.9 g/dL — ABNORMAL LOW (ref 13.0–17.0)
Immature Granulocytes: 1 %
Lymphocytes Relative: 14 %
Lymphs Abs: 1.6 10*3/uL (ref 0.7–4.0)
MCH: 31.2 pg (ref 26.0–34.0)
MCHC: 32.3 g/dL (ref 30.0–36.0)
MCV: 96.6 fL (ref 80.0–100.0)
Monocytes Absolute: 0.5 10*3/uL (ref 0.1–1.0)
Monocytes Relative: 4 %
Neutro Abs: 8 10*3/uL — ABNORMAL HIGH (ref 1.7–7.7)
Neutrophils Relative %: 71 %
Platelets: 198 10*3/uL (ref 150–400)
RBC: 3.81 MIL/uL — ABNORMAL LOW (ref 4.22–5.81)
RDW: 15.2 % (ref 11.5–15.5)
WBC: 11.1 10*3/uL — ABNORMAL HIGH (ref 4.0–10.5)
nRBC: 0 % (ref 0.0–0.2)

## 2020-12-20 LAB — IRON AND TIBC
Iron: 50 ug/dL (ref 45–182)
Saturation Ratios: 19 % (ref 17.9–39.5)
TIBC: 263 ug/dL (ref 250–450)
UIBC: 213 ug/dL

## 2020-12-20 LAB — POCT HEMOGLOBIN-HEMACUE: Hemoglobin: 11.9 g/dL — ABNORMAL LOW (ref 13.0–17.0)

## 2020-12-21 LAB — PTH, INTACT AND CALCIUM
Calcium, Total (PTH): 9.6 mg/dL (ref 8.6–10.2)
PTH: 34 pg/mL (ref 15–65)

## 2021-01-03 ENCOUNTER — Other Ambulatory Visit: Payer: Self-pay

## 2021-01-03 ENCOUNTER — Encounter (HOSPITAL_COMMUNITY)
Admission: RE | Admit: 2021-01-03 | Discharge: 2021-01-03 | Disposition: A | Discharge status: Home / Self Care | Payer: Medicare (Managed Care) | Source: Ambulatory Visit | Attending: Nephrology | Admitting: Nephrology

## 2021-01-03 DIAGNOSIS — N184 Chronic kidney disease, stage 4 (severe): Secondary | ICD-10-CM | POA: Diagnosis present

## 2021-01-03 LAB — POCT HEMOGLOBIN-HEMACUE: Hemoglobin: 11 g/dL — ABNORMAL LOW (ref 13.0–17.0)

## 2021-01-03 MED ORDER — EPOETIN ALFA 3000 UNIT/ML IJ SOLN: 3000.0000 [IU] | Freq: Once | INTRAMUSCULAR | Status: DC

## 2021-01-12 ENCOUNTER — Ambulatory Visit: Payer: Medicare (Managed Care) | Admitting: Internal Medicine

## 2021-01-17 ENCOUNTER — Encounter (HOSPITAL_COMMUNITY)
Admission: RE | Admit: 2021-01-17 | Discharge: 2021-01-17 | Disposition: A | Discharge status: Home / Self Care | Payer: Medicare (Managed Care) | Source: Ambulatory Visit | Attending: Nephrology | Admitting: Nephrology

## 2021-01-17 ENCOUNTER — Other Ambulatory Visit: Payer: Self-pay

## 2021-01-17 DIAGNOSIS — N184 Chronic kidney disease, stage 4 (severe): Secondary | ICD-10-CM | POA: Diagnosis not present

## 2021-01-17 LAB — POCT HEMOGLOBIN-HEMACUE: Hemoglobin: 11 g/dL — ABNORMAL LOW (ref 13.0–17.0)

## 2021-01-17 MED ORDER — EPOETIN ALFA 3000 UNIT/ML IJ SOLN: 3000.0000 [IU] | Freq: Once | INTRAMUSCULAR | Status: DC

## 2021-01-23 ENCOUNTER — Ambulatory Visit: Payer: Medicare (Managed Care) | Admitting: Orthopedic Surgery

## 2021-01-31 ENCOUNTER — Other Ambulatory Visit: Payer: Self-pay

## 2021-01-31 ENCOUNTER — Encounter (HOSPITAL_COMMUNITY)
Admission: RE | Admit: 2021-01-31 | Discharge: 2021-01-31 | Disposition: A | Discharge status: Home / Self Care | Payer: Medicare (Managed Care) | Source: Ambulatory Visit | Attending: Nephrology | Admitting: Nephrology

## 2021-01-31 DIAGNOSIS — N184 Chronic kidney disease, stage 4 (severe): Secondary | ICD-10-CM | POA: Diagnosis not present

## 2021-01-31 LAB — CBC WITH DIFFERENTIAL/PLATELET
Abs Immature Granulocytes: 0.09 10*3/uL — ABNORMAL HIGH (ref 0.00–0.07)
Basophils Absolute: 0.1 10*3/uL (ref 0.0–0.1)
Basophils Relative: 1 %
Eosinophils Absolute: 0.7 10*3/uL — ABNORMAL HIGH (ref 0.0–0.5)
Eosinophils Relative: 6 %
HCT: 34.4 % — ABNORMAL LOW (ref 39.0–52.0)
Hemoglobin: 11.4 g/dL — ABNORMAL LOW (ref 13.0–17.0)
Immature Granulocytes: 1 %
Lymphocytes Relative: 9 %
Lymphs Abs: 1.2 10*3/uL (ref 0.7–4.0)
MCH: 31.3 pg (ref 26.0–34.0)
MCHC: 33.1 g/dL (ref 30.0–36.0)
MCV: 94.5 fL (ref 80.0–100.0)
Monocytes Absolute: 0.7 10*3/uL (ref 0.1–1.0)
Monocytes Relative: 5 %
Neutro Abs: 10.3 10*3/uL — ABNORMAL HIGH (ref 1.7–7.7)
Neutrophils Relative %: 78 %
Platelets: 225 10*3/uL (ref 150–400)
RBC: 3.64 MIL/uL — ABNORMAL LOW (ref 4.22–5.81)
RDW: 14.9 % (ref 11.5–15.5)
WBC: 13.1 10*3/uL — ABNORMAL HIGH (ref 4.0–10.5)
nRBC: 0 % (ref 0.0–0.2)

## 2021-01-31 LAB — IRON AND TIBC
Iron: 34 ug/dL — ABNORMAL LOW (ref 45–182)
Saturation Ratios: 14 % — ABNORMAL LOW (ref 17.9–39.5)
TIBC: 249 ug/dL — ABNORMAL LOW (ref 250–450)
UIBC: 215 ug/dL

## 2021-01-31 LAB — RENAL FUNCTION PANEL
Albumin: 3.5 g/dL (ref 3.5–5.0)
Anion gap: 9 (ref 5–15)
BUN: 54 mg/dL — ABNORMAL HIGH (ref 8–23)
CO2: 27 mmol/L (ref 22–32)
Calcium: 9 mg/dL (ref 8.9–10.3)
Chloride: 100 mmol/L (ref 98–111)
Creatinine, Ser: 3.86 mg/dL — ABNORMAL HIGH (ref 0.61–1.24)
GFR, Estimated: 16 mL/min — ABNORMAL LOW (ref >60)
Glucose, Bld: 183 mg/dL — ABNORMAL HIGH (ref 70–99)
Phosphorus: 3.2 mg/dL (ref 2.5–4.6)
Potassium: 4.2 mmol/L (ref 3.5–5.1)
Sodium: 136 mmol/L (ref 135–145)

## 2021-01-31 LAB — POCT HEMOGLOBIN-HEMACUE: Hemoglobin: 11.3 g/dL — ABNORMAL LOW (ref 13.0–17.0)

## 2021-01-31 MED ORDER — EPOETIN ALFA 3000 UNIT/ML IJ SOLN: 3000.0000 [IU] | INTRAMUSCULAR | Status: DC

## 2021-02-01 LAB — PTH, INTACT AND CALCIUM
Calcium, Total (PTH): 9.2 mg/dL (ref 8.6–10.2)
PTH: 35 pg/mL (ref 15–65)

## 2021-02-14 ENCOUNTER — Encounter (HOSPITAL_COMMUNITY)
Admission: RE | Admit: 2021-02-14 | Discharge: 2021-02-14 | Disposition: A | Discharge status: Home / Self Care | Payer: Medicare (Managed Care) | Source: Ambulatory Visit | Attending: Nephrology | Admitting: Nephrology

## 2022-03-22 ENCOUNTER — Ambulatory Visit (INDEPENDENT_AMBULATORY_CARE_PROVIDER_SITE_OTHER): Payer: Medicare (Managed Care) | Admitting: Orthopedic Surgery

## 2022-03-22 ENCOUNTER — Ambulatory Visit (INDEPENDENT_AMBULATORY_CARE_PROVIDER_SITE_OTHER): Payer: Medicare (Managed Care)

## 2022-03-22 DIAGNOSIS — L97311 Non-pressure chronic ulcer of right ankle limited to breakdown of skin: Secondary | ICD-10-CM | POA: Diagnosis not present

## 2022-03-22 DIAGNOSIS — M25572 Pain in left ankle and joints of left foot: Secondary | ICD-10-CM

## 2022-03-22 DIAGNOSIS — T847XXA Infection and inflammatory reaction due to other internal orthopedic prosthetic devices, implants and grafts, initial encounter: Secondary | ICD-10-CM | POA: Diagnosis not present

## 2022-03-23 ENCOUNTER — Encounter: Payer: Self-pay | Admitting: Orthopedic Surgery

## 2022-03-23 ENCOUNTER — Telehealth: Payer: Self-pay | Admitting: Orthopedic Surgery

## 2022-03-23 NOTE — Telephone Encounter (Signed)
Patient Nurse Delcie Roch called in stating she talked to Patient and he said he was supposed to get surgery on his Ankle but no paperwork came back with him please send ASV to her call primary number on account [phone]

## 2022-03-23 NOTE — Progress Notes (Signed)
Office Visit Note   Patient: Eric Scott           Date of Birth: Mar 08, 1951           MRN: [id number] Visit Date: 03/22/2022              Requested by: [name], [address] [place],  [place] [address] PCP: [name], MD  Chief Complaint  Patient presents with   Left Ankle - Open Wound      HPI: Patient is a 71 year old gentleman who was seen for initial evaluation for a lateral malleolar ulcer left ankle with exposed deep retained hardware.  Patient states he underwent internal fixation a [place] time ago.  He states the ulcer has been present for about 3 months.  Patient has a history of venous insufficiency hypertension diabetes chronic kidney disease congestive heart failure and peripheral vascular disease.  Patient has not been on dialysis.  Assessment & Plan: Visit Diagnoses:  1. Pain in left ankle and joints of left foot   2. Non-pressure chronic ulcer of right ankle limited to breakdown of skin ([place])   3. Hardware complicating wound infection, initial encounter [facility])     Plan: With the exposed hardware we will plan for removal of the deep retained hardware debridement of the skin and soft tissue and bone lateral malleolus.  Will obtain deep tissue cultures and application of Kerecis tissue graft with a wound VAC to promote healing.  Follow-Up Instructions: Return in about 2 weeks (around 04/05/2022).   Ortho Exam  Patient is alert, oriented, no adenopathy, well-dressed, normal affect, normal respiratory effort. Examination patient has a good dorsalis pedis and posterior tibial pulse with multiphasic flow by Doppler.  Patient has venous insufficiency with dermatitis and weeping edema.  Examination of the ulcer over the lateral malleolus shows exposed hardware.  The ulcer is 3 x 2 cm in and 5 mm deep.  Imaging: No results found.    Labs: Lab Results  Component Value Date   HGBA1C 6.5 (H) 05/12/2020   HGBA1C 7.0 (H) 11/16/2019   HGBA1C 5.8 (H) 04/20/2019    ESRSEDRATE 60 (H) 11/26/2016   CRP 6.5 (H) 04/20/2019   CRP 3.2 (H) 11/26/2016   LABURIC 11.5 (H) 08/24/2009   REPTSTATUS 05/16/2020 FINAL 05/11/2020   GRAMSTAIN  02/05/2013    RARE WBC PRESENT, PREDOMINANTLY PMN NO SQUAMOUS EPITHELIAL CELLS SEEN FEW GRAM POSITIVE COCCI IN PAIRS Performed at [place]  05/11/2020    NO GROWTH 5 DAYS Performed at [facility], [address] [place], [place] [address]    [place] 05/10/2020     Lab Results  Component Value Date   ALBUMIN 3.5 01/31/2021   ALBUMIN 3.6 12/20/2020   ALBUMIN 3.1 (L) 11/23/2020    Lab Results  Component Value Date   MG 1.8 05/14/2020   MG 1.5 (L) 05/13/2020   MG 2.3 12/20/2019   No results found for: "VD25OH"  No results found for: "PREALBUMIN"    Latest Ref Rng & Units 01/31/2021    1:17 PM 01/31/2021    1:11 PM 01/17/2021    1:10 PM  CBC EXTENDED  WBC 4.0 - 10.5 K/uL  13.1    RBC 4.22 - 5.81 MIL/uL  3.64    Hemoglobin 13.0 - 17.0 g/dL 11.3  11.4  11.0   HCT 39.0 - 52.0 %  34.4    Platelets 150 - 400 K/uL  225    NEUT#  1.7 - 7.7 K/uL  10.3    Lymph# 0.7 - 4.0 K/uL  1.2       There is no height or weight on file to calculate BMI.  Orders:  Orders Placed This Encounter  Procedures   XR Ankle Complete Left   No orders of the defined types were placed in this encounter.    Procedures: No procedures performed  Clinical Data: No additional findings.  ROS:  All other systems negative, except as noted in the HPI. Review of Systems  Objective: Vital Signs: There were no vitals taken for this visit.  Specialty Comments:  No specialty comments available.  PMFS History: Patient Active Problem List   Diagnosis Date Noted   Abdominal pain    Cholelithiasis 05/12/2020   Elevated liver enzymes    RUQ pain    Bacteremia 05/11/2020   COPD  GOLD ?  / AB  01/26/2020   Acute on chronic respiratory failure with hypoxia ([place]) 12/18/2019   Respiratory  failure with hypoxia ([place]) 11/17/2019   Normocytic anemia 11/16/2019   Cellulitis of both lower extremities 11/16/2019   Chronic respiratory failure with hypoxia ([place]) 05/22/2019   Leukocytosis 05/22/2019   Chronic heart failure with preserved ejection fraction (HFpEF) ([place]) 05/22/2019   Acute respiratory failure ([place]) 05/17/2019   Obesity, Class III, BMI 40-49.9 (morbid obesity) ([place]) 05/17/2019   Atrial flutter, paroxysmal ([place]) 05/17/2019   COPD with acute exacerbation ([place]) 04/20/2019   GERD (gastroesophageal reflux disease) 08/08/2018   PUD (peptic ulcer disease)    Gastric erosions 02/13/2017   Duodenal ulcer 02/13/2017   Transaminitis    UGI bleed    Arm DVT (deep venous thromboembolism), acute, right ([place]) 12/18/2016   Uremia 12/10/2016   Acute renal failure ([name])    Volume depletion 11/23/2016   Hypotension    Liver enzyme elevation    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 ([place]) 11/15/2016   Arterial insufficiency of lower extremity ([place]) 11/15/2016   Pressure injury of skin 11/09/2016   Sepsis ([place]) 11/09/2016   Osteomyelitis ([place]) 11/09/2016   Wounds, multiple 11/08/2016   Chronic venous insufficiency 10/12/2016   Critical lower limb ischemia ([place]) 10/12/2016   Fatty liver 09/10/2016   History of colonic polyps 09/10/2016   Gallstone 09/10/2016   Edema 03/04/2015   AKI (acute kidney injury) ([place]) 03/04/2015   Fall 03/04/2015   Generalized weakness 03/04/2015   Diabetic polyneuropathy associated with type 2 diabetes mellitus ([place])    Anxiety    PVD (peripheral vascular disease) ([place])    Pneumonia 07/20/2014   Weight gain 05/02/2014   Coarse tremors 04/18/2014   Anxiety state 04/18/2014   Acute respiratory failure with hypoxia ([place]) 04/07/2014   Acute on chronic diastolic CHF (congestive heart failure) ([place]) 04/07/2014   Solitary pulmonary nodule 04/07/2014   COPD with exacerbation ([place]) 04/02/2014    Hypoxia 04/02/2014   Cerebral infarction ([place]) 06/01/2012   Chronic pain (back, legs) 05/30/2012   Toe laceration, 4th toe 05/30/2012   C. difficile diarrhea 05/30/2012   Hypokalemia 05/30/2012   Acute lacunar stroke ([place]) 05/27/2012   Type 2 diabetes mellitus with stage 4 chronic kidney disease, with [place]-term current use of insulin ([place]) 05/27/2012   Smoker 05/27/2012   HTN (hypertension) 05/27/2012   Past Medical History:  Diagnosis Date   Anemia    Anxiety    Chronic pain    legs, back; MRI 05/2012 with mild thoracic degenerative changes no spinal stenosis  CKD (chronic kidney disease) 05/12/2019   Stage IV   COPD (chronic obstructive pulmonary disease) ([place])    COVID-19    Depression    Diabetic peripheral neuropathy ([name])    "chronic" (46/65/9935)   Diastolic CHF ([name])    Duodenal ulcer hemorrhage    DVT (deep venous thrombosis) ([name])    right upper arm   GERD (gastroesophageal reflux disease)    Hypercholesteremia    Hypertension    Iron deficiency anemia    Osteomyelitis of ankle ([name])    Peripheral edema    PVD (peripheral vascular disease) ([place])    Renal disorder    Spinal stenosis    mild lumbar (MRI 05/2012)-L2-L3 to L4-L5 , mild lumbar foraminal stenosis    Stroke ([place]) 05/2012   Subacute, lacunar infarcts within the left basal ganglia and posterior limp of the left internal capsule/thalamus; "RUE; both feet weak" (05/27/2012)   Tremor    Type II diabetes mellitus ([place])     Family History  Problem Relation Age of Onset   Colon cancer Neg Hx     Past Surgical History:  Procedure Laterality Date   ANKLE SURGERY     ESOPHAGOGASTRODUODENOSCOPY (EGD) WITH PROPOFOL N/A 12/21/2016   Rourk: Ulcerative reflux esophagitis, erosive gastropathy, extensive duodenal ulceration likely site of bleeding.  Pathology with mild gastritis, no H. pylori   ESOPHAGOGASTRODUODENOSCOPY (EGD) WITH PROPOFOL N/A 04/02/2017   Fields: ESOPAHGITIS/GASTRIC AND DUODENAL ULCERS  HEALED. mild duodenitis   HERNIA REPAIR  01/05/2004   "belly button" (05/27/2012)   Social History   Occupational History   Not on file  Tobacco Use   Smoking status: Former    Packs/day: 2.00    Years: 54.00    Total pack years: 108.00    Types: Cigarettes    Quit date: 12/12/2019    Years since quitting: 2.2   Smokeless tobacco: Never  Vaping Use   Vaping Use: Never used  Substance and Sexual Activity   Alcohol use: No    Comment: 05/27/2012 "drank gallons and gallons 20 yr ago or so; last drink  at least 10 yr ago"   Drug use: No   Sexual activity: Not Currently

## 2022-03-23 NOTE — Telephone Encounter (Signed)
Yes, he is supposed to be scheduled for surgery. Pt did not want to wait for Malachy Mood to schedule him while he was in the office yesterday. He wanted Malachy Mood to call him for a time of surgery. His note is not yet dictated from [place] yesterday.

## 2022-03-23 NOTE — Telephone Encounter (Signed)
Eric Scott is informed. He will be scheduled for next week. Eric Scott will call them at [facility] to arrange the surgery.

## 2022-03-27 ENCOUNTER — Encounter (HOSPITAL_COMMUNITY): Payer: Self-pay | Admitting: Orthopedic Surgery

## 2022-03-27 NOTE — Progress Notes (Signed)
Pt is a resident at [facility] in [place]. I spoke with Dejah, LPN for pre-op call. She states pt is alert, oriented and able to speak for himself. She states that they have just received a positive MRSA report from pt's ankle wound. I have notified Dr. Sharol Given by secure chat. Pt is diabetic. Dejah states pt's last A1C was 5.8 on 02/21/22. See pre-op instructions that were sent to Anna at the SNF for insulin dosing for pt prior to surgery.

## 2022-03-27 NOTE — Progress Notes (Addendum)
Surgical Instructions   Eric Scott procedure is scheduled on tomorrow at 8:45 AM.  Report to [facility] Main Entrance "A" at 6:15 AM then check in with the Admitting office.  Call this number if you have problems the morning of surgery:  [phone]   Remember:  Eric Scott is not to eat after midnight tonight.   Eric Scott may drink clear liquids until 5:45 AM the morning of his surgery.   Clear liquids allowed are: Water, Non-Citrus Juices (without pulp), Carbonated Beverages, Clear Tea, Black Coffee ONLY (NO MILK, CREAM OR POWDERED CREAMER of any kind), and Gatorade    Take these medicines the morning of surgery with A SIP OF WATER:  Amlodipine (Norvasc) Aspirin Colchicine Fluoxetine (Prozac) Guafenesin  Hydrocodone-acetaminophen Metoprolol (Lopressor) Pantoprazole (Protonix) Tamsulosin (Flomax)   Lantus Insulin - give patient 1/2 of regular dose tonight. He will get 16 units. In the AM, check patient's blood sugar. If blood sugar is >220 take 1/2 of usual correction dose of Novolog insulin. If blood sugar is 70 or below, treat with 1/2 cup of clear juice (apple or cranberry) and recheck blood sugar 15 minutes after drinking juice. If blood sugar continues to be 70 or below, call the Short Stay department and ask to speak to a nurse.  Per instructions from Dr. Belinda Block, Anesthesiologist   Please have Eric Scott shower/bathe tonight with an antibacterial soap (CHG soap or Dial soap).   Have patient wash his face and private area first then rinse off. Have a clean washcloth to get wet and put the antibacterial soap on it and have pt wash from his just below his chin down to his toes, his back, legs and arms. Rinse off and have a clean towel to dry off. Have clean pajamas to wear and clean sheets on his bed.   Patient is not to use any lotions, powders, cologne or deodorant after showering. He may shave his face and neck but do not shave anywhere else.  Have patient wear clean  clothes to the hospital. No jewelry is to be worn and no money or valuables are to be brought.   Any questions this afternoon until 7 PM, please call me, Helene Kelp at [phone].

## 2022-03-28 ENCOUNTER — Other Ambulatory Visit: Payer: Self-pay

## 2022-03-28 ENCOUNTER — Encounter (HOSPITAL_COMMUNITY): Payer: Self-pay | Admitting: Orthopedic Surgery

## 2022-03-28 ENCOUNTER — Encounter (HOSPITAL_COMMUNITY)
Admission: AD | Disposition: A | Discharge status: Skilled Nursing Facility | Payer: Self-pay | Source: Skilled Nursing Facility | Attending: Internal Medicine

## 2022-03-28 ENCOUNTER — Ambulatory Visit (HOSPITAL_COMMUNITY): Payer: Medicare (Managed Care) | Admitting: Anesthesiology

## 2022-03-28 ENCOUNTER — Inpatient Hospital Stay (HOSPITAL_COMMUNITY)
Admission: AD | Admit: 2022-03-28 | Discharge: 2022-04-11 | DRG: 492 | Disposition: A | Discharge status: Skilled Nursing Facility | Payer: Medicare (Managed Care) | Source: Skilled Nursing Facility | Attending: Internal Medicine | Admitting: Internal Medicine

## 2022-03-28 ENCOUNTER — Ambulatory Visit (HOSPITAL_COMMUNITY): Payer: Medicare (Managed Care)

## 2022-03-28 ENCOUNTER — Ambulatory Visit (HOSPITAL_BASED_OUTPATIENT_CLINIC_OR_DEPARTMENT_OTHER): Payer: Medicare (Managed Care) | Admitting: Anesthesiology

## 2022-03-28 DIAGNOSIS — E1142 Type 2 diabetes mellitus with diabetic polyneuropathy: Secondary | ICD-10-CM | POA: Diagnosis present

## 2022-03-28 DIAGNOSIS — T8459XA Infection and inflammatory reaction due to other internal joint prosthesis, initial encounter: Secondary | ICD-10-CM | POA: Diagnosis not present

## 2022-03-28 DIAGNOSIS — Z993 Dependence on wheelchair: Secondary | ICD-10-CM

## 2022-03-28 DIAGNOSIS — I10 Essential (primary) hypertension: Secondary | ICD-10-CM | POA: Diagnosis present

## 2022-03-28 DIAGNOSIS — F419 Anxiety disorder, unspecified: Secondary | ICD-10-CM | POA: Diagnosis present

## 2022-03-28 DIAGNOSIS — E1122 Type 2 diabetes mellitus with diabetic chronic kidney disease: Secondary | ICD-10-CM | POA: Diagnosis present

## 2022-03-28 DIAGNOSIS — J441 Chronic obstructive pulmonary disease with (acute) exacerbation: Secondary | ICD-10-CM | POA: Diagnosis not present

## 2022-03-28 DIAGNOSIS — Z91018 Allergy to other foods: Secondary | ICD-10-CM

## 2022-03-28 DIAGNOSIS — N184 Chronic kidney disease, stage 4 (severe): Secondary | ICD-10-CM | POA: Diagnosis present

## 2022-03-28 DIAGNOSIS — I130 Hypertensive heart and chronic kidney disease with heart failure and stage 1 through stage 4 chronic kidney disease, or unspecified chronic kidney disease: Secondary | ICD-10-CM | POA: Diagnosis present

## 2022-03-28 DIAGNOSIS — M1A9XX0 Chronic gout, unspecified, without tophus (tophi): Secondary | ICD-10-CM | POA: Diagnosis present

## 2022-03-28 DIAGNOSIS — G8929 Other chronic pain: Secondary | ICD-10-CM | POA: Diagnosis present

## 2022-03-28 DIAGNOSIS — L02416 Cutaneous abscess of left lower limb: Secondary | ICD-10-CM | POA: Diagnosis not present

## 2022-03-28 DIAGNOSIS — Z79899 Other long term (current) drug therapy: Secondary | ICD-10-CM

## 2022-03-28 DIAGNOSIS — I509 Heart failure, unspecified: Secondary | ICD-10-CM

## 2022-03-28 DIAGNOSIS — Z87891 Personal history of nicotine dependence: Secondary | ICD-10-CM

## 2022-03-28 DIAGNOSIS — R54 Age-related physical debility: Secondary | ICD-10-CM | POA: Diagnosis present

## 2022-03-28 DIAGNOSIS — Z8673 Personal history of transient ischemic attack (TIA), and cerebral infarction without residual deficits: Secondary | ICD-10-CM

## 2022-03-28 DIAGNOSIS — J449 Chronic obstructive pulmonary disease, unspecified: Secondary | ICD-10-CM

## 2022-03-28 DIAGNOSIS — E669 Obesity, unspecified: Secondary | ICD-10-CM | POA: Diagnosis present

## 2022-03-28 DIAGNOSIS — I4892 Unspecified atrial flutter: Secondary | ICD-10-CM | POA: Diagnosis not present

## 2022-03-28 DIAGNOSIS — I110 Hypertensive heart disease with heart failure: Secondary | ICD-10-CM | POA: Diagnosis not present

## 2022-03-28 DIAGNOSIS — L97924 Non-pressure chronic ulcer of unspecified part of left lower leg with necrosis of bone: Secondary | ICD-10-CM | POA: Diagnosis not present

## 2022-03-28 DIAGNOSIS — Z7982 Long term (current) use of aspirin: Secondary | ICD-10-CM

## 2022-03-28 DIAGNOSIS — T847XXA Infection and inflammatory reaction due to other internal orthopedic prosthetic devices, implants and grafts, initial encounter: Secondary | ICD-10-CM | POA: Diagnosis not present

## 2022-03-28 DIAGNOSIS — M86172 Other acute osteomyelitis, left ankle and foot: Secondary | ICD-10-CM

## 2022-03-28 DIAGNOSIS — L97311 Non-pressure chronic ulcer of right ankle limited to breakdown of skin: Secondary | ICD-10-CM | POA: Diagnosis present

## 2022-03-28 DIAGNOSIS — Z6834 Body mass index (BMI) 34.0-34.9, adult: Secondary | ICD-10-CM

## 2022-03-28 DIAGNOSIS — N400 Enlarged prostate without lower urinary tract symptoms: Secondary | ICD-10-CM | POA: Diagnosis present

## 2022-03-28 DIAGNOSIS — N179 Acute kidney failure, unspecified: Secondary | ICD-10-CM | POA: Diagnosis not present

## 2022-03-28 DIAGNOSIS — E1169 Type 2 diabetes mellitus with other specified complication: Secondary | ICD-10-CM | POA: Diagnosis present

## 2022-03-28 DIAGNOSIS — I5033 Acute on chronic diastolic (congestive) heart failure: Secondary | ICD-10-CM | POA: Diagnosis not present

## 2022-03-28 DIAGNOSIS — I480 Paroxysmal atrial fibrillation: Secondary | ICD-10-CM | POA: Diagnosis present

## 2022-03-28 DIAGNOSIS — J9621 Acute and chronic respiratory failure with hypoxia: Secondary | ICD-10-CM | POA: Diagnosis not present

## 2022-03-28 DIAGNOSIS — I872 Venous insufficiency (chronic) (peripheral): Secondary | ICD-10-CM | POA: Diagnosis present

## 2022-03-28 DIAGNOSIS — E7800 Pure hypercholesterolemia, unspecified: Secondary | ICD-10-CM | POA: Diagnosis present

## 2022-03-28 DIAGNOSIS — Z9981 Dependence on supplemental oxygen: Secondary | ICD-10-CM

## 2022-03-28 DIAGNOSIS — D631 Anemia in chronic kidney disease: Secondary | ICD-10-CM | POA: Diagnosis present

## 2022-03-28 DIAGNOSIS — F39 Unspecified mood [affective] disorder: Secondary | ICD-10-CM | POA: Diagnosis present

## 2022-03-28 DIAGNOSIS — E1151 Type 2 diabetes mellitus with diabetic peripheral angiopathy without gangrene: Secondary | ICD-10-CM | POA: Diagnosis present

## 2022-03-28 DIAGNOSIS — Z794 Long term (current) use of insulin: Secondary | ICD-10-CM

## 2022-03-28 DIAGNOSIS — T84624A Infection and inflammatory reaction due to internal fixation device of right fibula, initial encounter: Principal | ICD-10-CM | POA: Diagnosis present

## 2022-03-28 DIAGNOSIS — Z7951 Long term (current) use of inhaled steroids: Secondary | ICD-10-CM

## 2022-03-28 DIAGNOSIS — Y793 Surgical instruments, materials and orthopedic devices (including sutures) associated with adverse incidents: Secondary | ICD-10-CM | POA: Diagnosis present

## 2022-03-28 DIAGNOSIS — K2100 Gastro-esophageal reflux disease with esophagitis, without bleeding: Secondary | ICD-10-CM | POA: Diagnosis present

## 2022-03-28 DIAGNOSIS — Z1622 Resistance to vancomycin related antibiotics: Secondary | ICD-10-CM | POA: Diagnosis present

## 2022-03-28 DIAGNOSIS — Z8616 Personal history of COVID-19: Secondary | ICD-10-CM

## 2022-03-28 DIAGNOSIS — F32A Depression, unspecified: Secondary | ICD-10-CM | POA: Diagnosis present

## 2022-03-28 DIAGNOSIS — Z888 Allergy status to other drugs, medicaments and biological substances status: Secondary | ICD-10-CM

## 2022-03-28 DIAGNOSIS — B9683 Acinetobacter baumannii as the cause of diseases classified elsewhere: Secondary | ICD-10-CM | POA: Diagnosis present

## 2022-03-28 DIAGNOSIS — S81802S Unspecified open wound, left lower leg, sequela: Secondary | ICD-10-CM

## 2022-03-28 DIAGNOSIS — T380X5A Adverse effect of glucocorticoids and synthetic analogues, initial encounter: Secondary | ICD-10-CM | POA: Diagnosis not present

## 2022-03-28 HISTORY — PX: APPLICATION OF WOUND VAC: SHX5189

## 2022-03-28 HISTORY — PX: HARDWARE REMOVAL: SHX979

## 2022-03-28 LAB — CBC
HCT: 32.6 % — ABNORMAL LOW (ref 39.0–52.0)
Hemoglobin: 10.7 g/dL — ABNORMAL LOW (ref 13.0–17.0)
MCH: 30.5 pg (ref 26.0–34.0)
MCHC: 32.8 g/dL (ref 30.0–36.0)
MCV: 92.9 fL (ref 80.0–100.0)
Platelets: 264 10*3/uL (ref 150–400)
RBC: 3.51 MIL/uL — ABNORMAL LOW (ref 4.22–5.81)
RDW: 14.9 % (ref 11.5–15.5)
WBC: 10.6 10*3/uL — ABNORMAL HIGH (ref 4.0–10.5)
nRBC: 0 % (ref 0.0–0.2)

## 2022-03-28 LAB — BASIC METABOLIC PANEL
Anion gap: 13 (ref 5–15)
BUN: 61 mg/dL — ABNORMAL HIGH (ref 8–23)
CO2: 23 mmol/L (ref 22–32)
Calcium: 8.5 mg/dL — ABNORMAL LOW (ref 8.9–10.3)
Chloride: 105 mmol/L (ref 98–111)
Creatinine, Ser: 3.78 mg/dL — ABNORMAL HIGH (ref 0.61–1.24)
GFR, Estimated: 16 mL/min — ABNORMAL LOW (ref >60)
Glucose, Bld: 124 mg/dL — ABNORMAL HIGH (ref 70–99)
Potassium: 4.1 mmol/L (ref 3.5–5.1)
Sodium: 141 mmol/L (ref 135–145)

## 2022-03-28 LAB — GLUCOSE, CAPILLARY
Glucose-Capillary: 130 mg/dL — ABNORMAL HIGH (ref 70–99)
Glucose-Capillary: 117 mg/dL — ABNORMAL HIGH (ref 70–99)
Glucose-Capillary: 139 mg/dL — ABNORMAL HIGH (ref 70–99)
Glucose-Capillary: 151 mg/dL — ABNORMAL HIGH (ref 70–99)
Glucose-Capillary: 225 mg/dL — ABNORMAL HIGH (ref 70–99)

## 2022-03-28 LAB — HEMOGLOBIN A1C
Hgb A1c MFr Bld: 5.8 % — ABNORMAL HIGH (ref 4.8–5.6)
Mean Plasma Glucose: 119.76 mg/dL

## 2022-03-28 SURGERY — REMOVAL, HARDWARE
Anesthesia: General | Site: Ankle | Laterality: Left

## 2022-03-28 MED ORDER — COLCHICINE 0.6 MG PO TABS
0.6000 mg | ORAL_TABLET | Freq: Every day | ORAL | Status: DC
  Administered 2022-03-29 – 2022-04-11 (×14): 0.6 mg via ORAL
  Filled 2022-03-28 (×14): qty 1

## 2022-03-28 MED ORDER — FENTANYL CITRATE (PF) 100 MCG/2ML IJ SOLN: 25.0000 ug | Freq: Once | INTRAMUSCULAR | Status: AC

## 2022-03-28 MED ORDER — CHLORHEXIDINE GLUCONATE 0.12 % MT SOLN
OROMUCOSAL | Status: AC
  Administered 2022-03-28: 15 mL via OROMUCOSAL
  Filled 2022-03-28: qty 15

## 2022-03-28 MED ORDER — LINEZOLID 600 MG PO TABS
600.0000 mg | ORAL_TABLET | Freq: Two times a day (BID) | ORAL | Status: DC
  Administered 2022-03-28 – 2022-03-30 (×4): 600 mg via ORAL
  Filled 2022-03-28 (×5): qty 1

## 2022-03-28 MED ORDER — INSULIN ASPART 100 UNIT/ML IJ SOLN
0.0000 [IU] | Freq: Three times a day (TID) | INTRAMUSCULAR | Status: DC
  Administered 2022-03-28: 5 [IU] via SUBCUTANEOUS
  Administered 2022-03-29 (×2): 3 [IU] via SUBCUTANEOUS
  Administered 2022-03-29 – 2022-04-01 (×5): 2 [IU] via SUBCUTANEOUS
  Administered 2022-04-01 – 2022-04-02 (×2): 3 [IU] via SUBCUTANEOUS
  Administered 2022-04-02 – 2022-04-04 (×3): 2 [IU] via SUBCUTANEOUS
  Administered 2022-04-04 – 2022-04-06 (×3): 3 [IU] via SUBCUTANEOUS
  Administered 2022-04-06: 5 [IU] via SUBCUTANEOUS
  Administered 2022-04-06: 2 [IU] via SUBCUTANEOUS
  Administered 2022-04-07: 5 [IU] via SUBCUTANEOUS
  Administered 2022-04-07 (×2): 3 [IU] via SUBCUTANEOUS
  Administered 2022-04-08: 15 [IU] via SUBCUTANEOUS
  Administered 2022-04-08 – 2022-04-09 (×3): 5 [IU] via SUBCUTANEOUS
  Administered 2022-04-09: 8 [IU] via SUBCUTANEOUS
  Administered 2022-04-09: 3 [IU] via SUBCUTANEOUS
  Administered 2022-04-10: 11 [IU] via SUBCUTANEOUS
  Administered 2022-04-10: 3 [IU] via SUBCUTANEOUS
  Administered 2022-04-11: 5 [IU] via SUBCUTANEOUS
  Administered 2022-04-11: 11 [IU] via SUBCUTANEOUS

## 2022-03-28 MED ORDER — ONDANSETRON HCL 4 MG PO TABS: 4.0000 mg | ORAL_TABLET | Freq: Four times a day (QID) | ORAL | Status: DC | PRN

## 2022-03-28 MED ORDER — FENTANYL CITRATE (PF) 100 MCG/2ML IJ SOLN
25.0000 ug | INTRAMUSCULAR | Status: DC | PRN
  Administered 2022-03-28: 50 ug via INTRAVENOUS

## 2022-03-28 MED ORDER — OXYCODONE HCL 5 MG/5ML PO SOLN: 5.0000 mg | Freq: Once | ORAL | Status: DC | PRN

## 2022-03-28 MED ORDER — FENTANYL CITRATE (PF) 100 MCG/2ML IJ SOLN
INTRAMUSCULAR | Status: AC
  Administered 2022-03-28: 25 ug via INTRAVENOUS
  Filled 2022-03-28: qty 2

## 2022-03-28 MED ORDER — INSULIN ASPART 100 UNIT/ML IJ SOLN: 0.0000 [IU] | INTRAMUSCULAR | Status: DC | PRN

## 2022-03-28 MED ORDER — MIDAZOLAM HCL 2 MG/2ML IJ SOLN
INTRAMUSCULAR | Status: AC
  Filled 2022-03-28: qty 2

## 2022-03-28 MED ORDER — BISACODYL 10 MG RE SUPP: 10.0000 mg | Freq: Every day | RECTAL | Status: DC | PRN

## 2022-03-28 MED ORDER — METOPROLOL TARTRATE 50 MG PO TABS
50.0000 mg | ORAL_TABLET | Freq: Two times a day (BID) | ORAL | Status: DC
  Administered 2022-03-28 – 2022-04-07 (×20): 50 mg via ORAL
  Filled 2022-03-28 (×23): qty 1

## 2022-03-28 MED ORDER — ACETAMINOPHEN 10 MG/ML IV SOLN: 1000.0000 mg | Freq: Once | INTRAVENOUS | Status: DC | PRN

## 2022-03-28 MED ORDER — CEFAZOLIN IN SODIUM CHLORIDE 3-0.9 GM/100ML-% IV SOLN: 3.0000 g | INTRAVENOUS | Status: DC

## 2022-03-28 MED ORDER — ACETAMINOPHEN 325 MG PO TABS
325.0000 mg | ORAL_TABLET | Freq: Four times a day (QID) | ORAL | Status: DC | PRN
  Administered 2022-03-29: 325 mg via ORAL
  Filled 2022-03-28: qty 1

## 2022-03-28 MED ORDER — ACETAMINOPHEN 160 MG/5ML PO SOLN: 325.0000 mg | ORAL | Status: DC | PRN

## 2022-03-28 MED ORDER — ONDANSETRON HCL 4 MG/2ML IJ SOLN
INTRAMUSCULAR | Status: DC | PRN
  Administered 2022-03-28: 4 mg via INTRAVENOUS

## 2022-03-28 MED ORDER — METHOCARBAMOL 500 MG PO TABS
500.0000 mg | ORAL_TABLET | Freq: Four times a day (QID) | ORAL | Status: DC | PRN
  Administered 2022-03-28 – 2022-04-09 (×8): 500 mg via ORAL
  Filled 2022-03-28 (×8): qty 1

## 2022-03-28 MED ORDER — 0.9 % SODIUM CHLORIDE (POUR BTL) OPTIME
TOPICAL | Status: DC | PRN
  Administered 2022-03-28: 1000 mL

## 2022-03-28 MED ORDER — LIDOCAINE 2% (20 MG/ML) 5 ML SYRINGE
INTRAMUSCULAR | Status: DC | PRN
  Administered 2022-03-28: 40 mg via INTRAVENOUS

## 2022-03-28 MED ORDER — LACTATED RINGERS IV SOLN: INTRAVENOUS | Status: DC

## 2022-03-28 MED ORDER — MIDAZOLAM HCL 2 MG/2ML IJ SOLN
INTRAMUSCULAR | Status: AC
  Administered 2022-03-28: 1 mg via INTRAVENOUS
  Filled 2022-03-28: qty 2

## 2022-03-28 MED ORDER — PROMETHAZINE HCL 25 MG/ML IJ SOLN: 6.2500 mg | INTRAMUSCULAR | Status: DC | PRN

## 2022-03-28 MED ORDER — AMLODIPINE BESYLATE 10 MG PO TABS
10.0000 mg | ORAL_TABLET | Freq: Every day | ORAL | Status: DC
  Administered 2022-03-29 – 2022-04-11 (×14): 10 mg via ORAL
  Filled 2022-03-28 (×14): qty 1

## 2022-03-28 MED ORDER — AMISULPRIDE (ANTIEMETIC) 5 MG/2ML IV SOLN: 10.0000 mg | Freq: Once | INTRAVENOUS | Status: DC | PRN

## 2022-03-28 MED ORDER — INSULIN GLARGINE-YFGN 100 UNIT/ML ~~LOC~~ SOLN
20.0000 [IU] | Freq: Every day | SUBCUTANEOUS | Status: DC
  Administered 2022-03-28 – 2022-04-03 (×6): 20 [IU] via SUBCUTANEOUS
  Filled 2022-03-28 (×9): qty 0.2

## 2022-03-28 MED ORDER — HYDROMORPHONE HCL 1 MG/ML IJ SOLN
INTRAMUSCULAR | Status: DC | PRN
  Administered 2022-03-28: .5 mg via INTRAVENOUS

## 2022-03-28 MED ORDER — METOCLOPRAMIDE HCL 5 MG PO TABS: 5.0000 mg | ORAL_TABLET | Freq: Three times a day (TID) | ORAL | Status: DC | PRN

## 2022-03-28 MED ORDER — CEFAZOLIN SODIUM-DEXTROSE 2-3 GM-%(50ML) IV SOLR
INTRAVENOUS | Status: DC | PRN
  Administered 2022-03-28: 2 g via INTRAVENOUS

## 2022-03-28 MED ORDER — OXYCODONE HCL 5 MG PO TABS: 5.0000 mg | ORAL_TABLET | Freq: Once | ORAL | Status: DC | PRN

## 2022-03-28 MED ORDER — METOCLOPRAMIDE HCL 5 MG/ML IJ SOLN: 5.0000 mg | Freq: Three times a day (TID) | INTRAMUSCULAR | Status: DC | PRN

## 2022-03-28 MED ORDER — DEXAMETHASONE SODIUM PHOSPHATE 10 MG/ML IJ SOLN
INTRAMUSCULAR | Status: DC | PRN
  Administered 2022-03-28: 4 mg via INTRAVENOUS

## 2022-03-28 MED ORDER — SODIUM CHLORIDE 0.9 % IV SOLN
1.0000 g | INTRAVENOUS | Status: DC
  Administered 2022-03-29 – 2022-03-30 (×2): 1 g via INTRAVENOUS
  Filled 2022-03-28 (×2): qty 10

## 2022-03-28 MED ORDER — INSULIN ASPART 100 UNIT/ML IJ SOLN
4.0000 [IU] | Freq: Three times a day (TID) | INTRAMUSCULAR | Status: DC
  Administered 2022-03-28 – 2022-04-09 (×22): 4 [IU] via SUBCUTANEOUS

## 2022-03-28 MED ORDER — FLUOXETINE HCL 20 MG PO CAPS
40.0000 mg | ORAL_CAPSULE | Freq: Every day | ORAL | Status: DC
  Administered 2022-03-29 – 2022-04-11 (×14): 40 mg via ORAL
  Filled 2022-03-28: qty 2
  Filled 2022-03-28: qty 4
  Filled 2022-03-28: qty 2
  Filled 2022-03-28: qty 4
  Filled 2022-03-28: qty 2
  Filled 2022-03-28: qty 4
  Filled 2022-03-28 (×3): qty 2
  Filled 2022-03-28 (×5): qty 4
  Filled 2022-03-28: qty 2

## 2022-03-28 MED ORDER — HYDROCODONE-ACETAMINOPHEN 5-325 MG PO TABS
1.0000 | ORAL_TABLET | ORAL | Status: DC | PRN
  Administered 2022-03-28 – 2022-03-30 (×3): 2 via ORAL
  Administered 2022-03-31 (×2): 1 via ORAL
  Administered 2022-03-31 – 2022-04-10 (×9): 2 via ORAL
  Filled 2022-03-28 (×9): qty 2
  Filled 2022-03-28: qty 1
  Filled 2022-03-28 (×4): qty 2
  Filled 2022-03-28: qty 1

## 2022-03-28 MED ORDER — PROPOFOL 10 MG/ML IV BOLUS
INTRAVENOUS | Status: DC | PRN
  Administered 2022-03-28: 140 mg via INTRAVENOUS

## 2022-03-28 MED ORDER — PROPOFOL 10 MG/ML IV BOLUS
INTRAVENOUS | Status: AC
  Filled 2022-03-28: qty 20

## 2022-03-28 MED ORDER — ACETAMINOPHEN 325 MG PO TABS: 325.0000 mg | ORAL_TABLET | ORAL | Status: DC | PRN

## 2022-03-28 MED ORDER — HYDROCODONE-ACETAMINOPHEN 7.5-325 MG PO TABS
1.0000 | ORAL_TABLET | ORAL | Status: DC | PRN
  Administered 2022-03-28 – 2022-04-01 (×8): 2 via ORAL
  Administered 2022-04-02 – 2022-04-05 (×8): 1 via ORAL
  Administered 2022-04-06: 2 via ORAL
  Administered 2022-04-08 (×2): 1 via ORAL
  Administered 2022-04-09 – 2022-04-10 (×5): 2 via ORAL
  Administered 2022-04-10: 1 via ORAL
  Filled 2022-03-28 (×2): qty 2
  Filled 2022-03-28 (×3): qty 1
  Filled 2022-03-28 (×4): qty 2
  Filled 2022-03-28: qty 1
  Filled 2022-03-28: qty 2
  Filled 2022-03-28 (×5): qty 1
  Filled 2022-03-28: qty 2
  Filled 2022-03-28: qty 1
  Filled 2022-03-28 (×7): qty 2

## 2022-03-28 MED ORDER — FLUTICASONE FUROATE-VILANTEROL 100-25 MCG/ACT IN AEPB
1.0000 | INHALATION_SPRAY | Freq: Every day | RESPIRATORY_TRACT | Status: DC
  Administered 2022-03-31 – 2022-04-10 (×8): 1 via RESPIRATORY_TRACT
  Filled 2022-03-28 (×2): qty 28

## 2022-03-28 MED ORDER — MIDAZOLAM HCL 2 MG/2ML IJ SOLN: 1.0000 mg | Freq: Once | INTRAMUSCULAR | Status: AC

## 2022-03-28 MED ORDER — METHOCARBAMOL 1000 MG/10ML IJ SOLN: 500.0000 mg | Freq: Four times a day (QID) | INTRAVENOUS | Status: DC | PRN

## 2022-03-28 MED ORDER — DOCUSATE SODIUM 100 MG PO CAPS
100.0000 mg | ORAL_CAPSULE | Freq: Two times a day (BID) | ORAL | Status: DC
  Administered 2022-03-28 – 2022-04-08 (×13): 100 mg via ORAL
  Filled 2022-03-28 (×22): qty 1

## 2022-03-28 MED ORDER — HYDROMORPHONE HCL 1 MG/ML IJ SOLN
INTRAMUSCULAR | Status: AC
  Filled 2022-03-28: qty 0.5

## 2022-03-28 MED ORDER — MORPHINE SULFATE (PF) 2 MG/ML IV SOLN
0.5000 mg | INTRAVENOUS | Status: DC | PRN
  Administered 2022-03-28 – 2022-04-03 (×3): 1 mg via INTRAVENOUS
  Filled 2022-03-28 (×3): qty 1

## 2022-03-28 MED ORDER — BUPIVACAINE-EPINEPHRINE (PF) 0.5% -1:200000 IJ SOLN
INTRAMUSCULAR | Status: DC | PRN
  Administered 2022-03-28: 30 mL via PERINEURAL

## 2022-03-28 MED ORDER — GABAPENTIN 300 MG PO CAPS
300.0000 mg | ORAL_CAPSULE | Freq: Every day | ORAL | Status: DC
  Administered 2022-03-28 – 2022-04-10 (×14): 300 mg via ORAL
  Filled 2022-03-28 (×14): qty 1

## 2022-03-28 MED ORDER — ONDANSETRON HCL 4 MG/2ML IJ SOLN: 4.0000 mg | Freq: Four times a day (QID) | INTRAMUSCULAR | Status: DC | PRN

## 2022-03-28 MED ORDER — POLYETHYLENE GLYCOL 3350 17 G PO PACK: 17.0000 g | PACK | Freq: Every day | ORAL | Status: DC | PRN

## 2022-03-28 MED ORDER — TAMSULOSIN HCL 0.4 MG PO CAPS
0.4000 mg | ORAL_CAPSULE | Freq: Every day | ORAL | Status: DC
  Administered 2022-03-28 – 2022-04-10 (×14): 0.4 mg via ORAL
  Filled 2022-03-28 (×14): qty 1

## 2022-03-28 MED ORDER — ALPRAZOLAM 0.5 MG PO TABS
0.5000 mg | ORAL_TABLET | Freq: Every day | ORAL | Status: DC
  Administered 2022-03-28 – 2022-04-10 (×15): 0.5 mg via ORAL
  Filled 2022-03-28 (×15): qty 1

## 2022-03-28 MED ORDER — CEFAZOLIN IN SODIUM CHLORIDE 3-0.9 GM/100ML-% IV SOLN
INTRAVENOUS | Status: AC
  Filled 2022-03-28: qty 100

## 2022-03-28 MED ORDER — ASPIRIN 81 MG PO TBEC
81.0000 mg | DELAYED_RELEASE_TABLET | Freq: Every day | ORAL | Status: DC
  Administered 2022-03-29 – 2022-04-11 (×14): 81 mg via ORAL
  Filled 2022-03-28 (×14): qty 1

## 2022-03-28 MED ORDER — SODIUM CHLORIDE 0.9 % IV SOLN: INTRAVENOUS | Status: DC

## 2022-03-28 MED ORDER — ROPIVACAINE HCL 5 MG/ML IJ SOLN
INTRAMUSCULAR | Status: DC | PRN
  Administered 2022-03-28: 20 mL via PERINEURAL

## 2022-03-28 MED ORDER — CALCITRIOL 0.25 MCG PO CAPS
0.2500 ug | ORAL_CAPSULE | Freq: Every day | ORAL | Status: DC
  Administered 2022-03-29 – 2022-04-11 (×14): 0.25 ug via ORAL
  Filled 2022-03-28 (×14): qty 1

## 2022-03-28 MED ORDER — IPRATROPIUM-ALBUTEROL 0.5-2.5 (3) MG/3ML IN SOLN
3.0000 mL | RESPIRATORY_TRACT | Status: DC | PRN
  Administered 2022-03-31 – 2022-04-01 (×4): 3 mL via RESPIRATORY_TRACT
  Filled 2022-03-28 (×4): qty 3

## 2022-03-28 MED ORDER — MAGNESIUM CITRATE PO SOLN
1.0000 | Freq: Once | ORAL | Status: DC | PRN
  Filled 2022-03-28: qty 296

## 2022-03-28 MED ORDER — CEFTRIAXONE SODIUM 1 G IJ SOLR: 1.0000 g | Freq: Every day | INTRAMUSCULAR | Status: DC

## 2022-03-28 MED ORDER — CHLORHEXIDINE GLUCONATE 0.12 % MT SOLN
15.0000 mL | OROMUCOSAL | Status: AC
  Filled 2022-03-28: qty 15

## 2022-03-28 MED ORDER — FENTANYL CITRATE (PF) 100 MCG/2ML IJ SOLN
INTRAMUSCULAR | Status: AC
  Filled 2022-03-28: qty 2

## 2022-03-28 SURGICAL SUPPLY — 50 items
BAG COUNTER SPONGE SURGICOUNT (BAG) ×2 IMPLANT
BAG SPNG CNTER NS LX DISP (BAG) ×2
BANDAGE ESMARK 6X9 LF (GAUZE/BANDAGES/DRESSINGS) IMPLANT
BNDG CMPR 9X6 STRL LF SNTH (GAUZE/BANDAGES/DRESSINGS)
BNDG COHESIVE 4X5 TAN STRL (GAUZE/BANDAGES/DRESSINGS) IMPLANT
BNDG ESMARK 6X9 LF (GAUZE/BANDAGES/DRESSINGS)
BNDG GAUZE DERMACEA FLUFF 4 (GAUZE/BANDAGES/DRESSINGS) ×2 IMPLANT
BNDG GZE DERMACEA 4 6PLY (GAUZE/BANDAGES/DRESSINGS) ×2
BUR EGG ELITE 4.0 (BURR) IMPLANT
CANISTER WOUNDNEG PRESSURE 500 (CANNISTER) IMPLANT
COVER SURGICAL LIGHT HANDLE (MISCELLANEOUS) ×4 IMPLANT
CUFF TOURN SGL QUICK 34 (TOURNIQUET CUFF)
CUFF TOURN SGL QUICK 42 (TOURNIQUET CUFF) IMPLANT
CUFF TRNQT CYL 34X4.125X (TOURNIQUET CUFF) IMPLANT
DRAPE C-ARM 42X72 X-RAY (DRAPES) IMPLANT
DRAPE DERMATAC (DRAPES) IMPLANT
DRAPE INCISE IOBAN 66X45 STRL (DRAPES) IMPLANT
DRAPE ORTHO SPLIT 77X108 STRL (DRAPES)
DRAPE SURG ORHT 6 SPLT 77X108 (DRAPES) IMPLANT
DRAPE U-SHAPE 47X51 STRL (DRAPES) ×2 IMPLANT
DRSG EMULSION OIL 3X3 NADH (GAUZE/BANDAGES/DRESSINGS) ×2 IMPLANT
DURAPREP 26ML APPLICATOR (WOUND CARE) ×2 IMPLANT
ELECT REM PT RETURN 9FT ADLT (ELECTROSURGICAL) ×2
ELECTRODE REM PT RTRN 9FT ADLT (ELECTROSURGICAL) ×2 IMPLANT
GAUZE PAD ABD 8X10 STRL (GAUZE/BANDAGES/DRESSINGS) ×2 IMPLANT
GAUZE SPONGE 4X4 12PLY STRL (GAUZE/BANDAGES/DRESSINGS) ×2 IMPLANT
GLOVE BIOGEL PI IND STRL 9 (GLOVE) ×2 IMPLANT
GLOVE SURG ORTHO 9.0 STRL STRW (GLOVE) ×2 IMPLANT
GOWN STRL REUS W/ TWL XL LVL3 (GOWN DISPOSABLE) ×6 IMPLANT
GOWN STRL REUS W/TWL XL LVL3 (GOWN DISPOSABLE) ×6
GRAFT SKIN WND SURGICLOSE M95 (Tissue) IMPLANT
KIT BASIN OR (CUSTOM PROCEDURE TRAY) ×2 IMPLANT
KIT TURNOVER KIT B (KITS) ×2 IMPLANT
MANIFOLD NEPTUNE II (INSTRUMENTS) ×2 IMPLANT
NS IRRIG 1000ML POUR BTL (IV SOLUTION) ×2 IMPLANT
PACK ORTHO EXTREMITY (CUSTOM PROCEDURE TRAY) ×2 IMPLANT
PAD ARMBOARD 7.5X6 YLW CONV (MISCELLANEOUS) ×4 IMPLANT
PREVENA RESTOR ARTHOFORM 33X30 (CANNISTER) IMPLANT
SPONGE T-LAP 18X18 ~~LOC~~+RFID (SPONGE) IMPLANT
STAPLER VISISTAT 35W (STAPLE) IMPLANT
STOCKINETTE IMPERVIOUS 9X36 MD (GAUZE/BANDAGES/DRESSINGS) IMPLANT
SUT ETHILON 2 0 PSLX (SUTURE) IMPLANT
SUT VIC AB 0 CT1 27 (SUTURE)
SUT VIC AB 0 CT1 27XBRD ANBCTR (SUTURE) IMPLANT
SUT VIC AB 2-0 CT1 27 (SUTURE)
SUT VIC AB 2-0 CT1 TAPERPNT 27 (SUTURE) IMPLANT
TOWEL GREEN STERILE (TOWEL DISPOSABLE) ×2 IMPLANT
TOWEL GREEN STERILE FF (TOWEL DISPOSABLE) ×2 IMPLANT
UNDERPAD 30X36 HEAVY ABSORB (UNDERPADS AND DIAPERS) ×2 IMPLANT
WATER STERILE IRR 1000ML POUR (IV SOLUTION) ×2 IMPLANT

## 2022-03-28 NOTE — Plan of Care (Signed)

## 2022-03-28 NOTE — H&P (Signed)
Eric Scott is an 71 y.o. male.   Chief Complaint: Ulceration with infection lateral left ankle. HPI: Patient is a 71 year old gentleman who was seen for initial evaluation for a lateral malleolar ulcer left ankle with exposed deep retained hardware.  Patient states he underwent internal fixation a [place] time ago.  He states the ulcer has been present for about 3 months.  Patient has a history of venous insufficiency hypertension diabetes chronic kidney disease congestive heart failure and peripheral vascular disease.  Patient has not been on dialysis.   Past Medical History:  Diagnosis Date   Anemia    Anxiety    Chronic pain    legs, back; MRI 05/2012 with mild thoracic degenerative changes no spinal stenosis    CKD (chronic kidney disease) 05/12/2019   Stage IV   COPD (chronic obstructive pulmonary disease) ([place])    COVID-19    Depression    Diabetic peripheral neuropathy ([name])    "chronic" (29/79/8921)   Diastolic CHF ([name])    Duodenal ulcer hemorrhage    DVT (deep venous thrombosis) ([name])    right upper arm   GERD (gastroesophageal reflux disease)    Hypercholesteremia    Hypertension    Iron deficiency anemia    Osteomyelitis of ankle ([name])    Peripheral edema    PVD (peripheral vascular disease) ([place])    Renal disorder    Spinal stenosis    mild lumbar (MRI 05/2012)-L2-L3 to L4-L5 , mild lumbar foraminal stenosis    Stroke ([place]) 05/2012   Subacute, lacunar infarcts within the left basal ganglia and posterior limp of the left internal capsule/thalamus; "RUE; both feet weak" (05/27/2012)   Tremor    Type II diabetes mellitus ([name])     Past Surgical History:  Procedure Laterality Date   ANKLE SURGERY     ESOPHAGOGASTRODUODENOSCOPY (EGD) WITH PROPOFOL N/A 12/21/2016   Rourk: Ulcerative reflux esophagitis, erosive gastropathy, extensive duodenal ulceration likely site of bleeding.  Pathology with mild gastritis, no H. pylori   ESOPHAGOGASTRODUODENOSCOPY (EGD) WITH  PROPOFOL N/A 04/02/2017   Fields: ESOPAHGITIS/GASTRIC AND DUODENAL ULCERS HEALED. mild duodenitis   HERNIA REPAIR  01/05/2004   "belly button" (05/27/2012)    Family History  Problem Relation Age of Onset   Colon cancer Neg Hx    Social History:  reports that he quit smoking about 2 years ago. His smoking use included cigarettes. He has a 108.00 pack-year smoking history. He has never used smokeless tobacco. He reports that he does not drink alcohol and does not use drugs.  Allergies:  Allergies  Allergen Reactions   Baclofen     unknown   Blueberry Flavor Other (See Comments)    Unknown   Cucumber Extract Other (See Comments)    "Fells like I'm having a heart attack"   Flexeril [Cyclobenzaprine Hcl] Other (See Comments)    "whole body  Tremors" (05/27/2012)   Kiwi Extract Other (See Comments)    "feels like I'm having a heart attack"    Medications Prior to Admission  Medication Sig Dispense Refill   acetaminophen (TYLENOL) 325 MG tablet Take 2 tablets (650 mg total) by mouth every 6 (six) hours as needed for mild pain, fever or headache (or Fever >/= 101). (Patient taking differently: Take 650 mg by mouth every 12 (twelve) hours as needed for mild pain.) 12 tablet 0   ALPRAZolam (XANAX) 0.5 MG tablet Take 0.5 mg by mouth at bedtime.     Amino Acids-Protein Hydrolys (PRO-STAT AWC) LIQD  Take 30 mLs by mouth daily.     amLODipine (NORVASC) 10 MG tablet Take 1 tablet (10 mg total) by mouth daily. 30 tablet 2   ascorbic acid (VITAMIN C) 500 MG tablet Take 500 mg by mouth 2 (two) times daily.     aspirin EC 81 MG tablet Take 1 tablet (81 mg total) by mouth daily with breakfast. 30 tablet 11   BREO ELLIPTA 100-25 MCG/INH AEPB Inhale 1 puff into the lungs daily.      calcitRIOL (ROCALTROL) 0.25 MCG capsule Take 0.25 mcg by mouth daily.     calcium carbonate (TUMS - DOSED IN MG ELEMENTAL CALCIUM) 500 MG chewable tablet Chew 2 tablets by mouth 2 (two) times daily.     cefTRIAXone  (ROCEPHIN) 1 g injection Inject 1 g into the muscle daily.     cholecalciferol (VITAMIN D) 1000 units tablet Take 1,000 Units by mouth daily.     coconut oil OIL Apply 1 Application topically every 6 (six) hours as needed (protection and prevention).     colchicine 0.6 MG tablet Take 0.6 mg by mouth daily.     cyanocobalamin 1000 MCG tablet Take 1,000 mcg by mouth daily.     ferrous sulfate 325 (65 FE) MG tablet Take 325 mg by mouth 2 (two) times daily.     FLUoxetine (PROZAC) 40 MG capsule Take 40 mg by mouth daily.     gabapentin (NEURONTIN) 300 MG capsule Take 1 capsule (300 mg total) by mouth at bedtime. 30 capsule 1   guaiFENesin 200 MG tablet Take 200 mg by mouth 2 (two) times daily.     HYDROcodone-acetaminophen (NORCO) 7.5-325 MG tablet Take 1 tablet by mouth 4 (four) times daily.     insulin aspart (NOVOLOG) 100 UNIT/ML injection Inject 2-10 Units into the skin See admin instructions. Sliding scale if 200-250 = 2 units, 251-300 = 4 units, 301-350 = 6 units, 351-400 = 8 units, 401-500 units = 10 units before meals and at betime     ipratropium-albuterol (DUONEB) 0.5-2.5 (3) MG/3ML SOLN Take 3 mLs by nebulization every 4 (four) hours as needed (shortness of breath / wheezing).     LANTUS 100 UNIT/ML injection Inject 32 Units into the skin at bedtime.      magnesium oxide (MAG-OX) 400 MG tablet Take 400 mg by mouth daily.     metoprolol tartrate (LOPRESSOR) 50 MG tablet Take 50 mg by mouth 2 (two) times daily.     Nutritional Supplement LIQD Take 120 mLs by mouth daily. "House supplement"     pantoprazole (PROTONIX) 40 MG tablet Take 1 tablet (40 mg total) by mouth daily before breakfast. 30 tablet 0   polyethylene glycol (MIRALAX / GLYCOLAX) 17 g packet Take 17 g by mouth daily. For constipation     potassium bicarbonate (K-LYTE) 25 MEQ disintegrating tablet Take 25 mEq by mouth daily.     senna (SENOKOT) 8.6 MG tablet Take 2 tablets by mouth 2 (two) times daily.     tamsulosin (FLOMAX)  0.4 MG CAPS capsule Take 1 capsule (0.4 mg total) by mouth daily after supper. (Patient taking differently: Take 0.4 mg by mouth daily.) 30 capsule 2   torsemide (DEMADEX) 20 MG tablet Take 2 tablets (40 mg total) by mouth 2 (two) times daily. (Patient taking differently: Take 80 mg by mouth 2 (two) times daily.)     linezolid (ZYVOX) 600 MG tablet Take 600 mg by mouth 2 (two) times daily.  No results found for this or any previous visit (from the past 48 hour(s)). No results found.  Review of Systems  All other systems reviewed and are negative.   There were no vitals taken for this visit. Physical Exam  Patient is alert, oriented, no adenopathy, well-dressed, normal affect, normal respiratory effort. Examination patient has a good dorsalis pedis and posterior tibial pulse with multiphasic flow by Doppler.  Patient has venous insufficiency with dermatitis and weeping edema.  Examination of the ulcer over the lateral malleolus shows exposed hardware.  The ulcer is 3 x 2 cm in and 5 mm deep. Assessment/Plan 1. Pain in left ankle and joints of left foot   2. Non-pressure chronic ulcer of right ankle limited to breakdown of skin ([place])   3. Hardware complicating wound infection, initial encounter [facility])       Plan: With the exposed hardware we will plan for removal of the deep retained hardware debridement of the skin and soft tissue and bone lateral malleolus.  Will obtain deep tissue cultures and application of Kerecis tissue graft with a wound VAC to promote healing.  [name], MD 03/28/2022, 6:50 AM

## 2022-03-28 NOTE — Progress Notes (Signed)
Spoke with Georgina Snell, nurse at nursing home that patient resides at for list of medications and time patient was given them this am

## 2022-03-28 NOTE — Anesthesia Procedure Notes (Signed)
Anesthesia Regional Block: Popliteal block   Pre-Anesthetic Checklist: , timeout performed,  Correct Patient, Correct Site, Correct Laterality,  Correct Procedure, Correct Position, site marked,  Risks and benefits discussed,  Surgical consent,  Pre-op evaluation,  At surgeon's request and post-op pain management  Laterality: Left  Prep: chloraprep       Needles:  Injection technique: Single-shot  Needle Type: Echogenic Stimulator Needle     Needle Length: 9cm  Needle Gauge: 21     Additional Needles:   Procedures:,,,, ultrasound used (permanent image in chart),,    Narrative:  Start time: 03/28/2022 8:05 AM End time: 03/28/2022 8:10 AM Injection made incrementally with aspirations every 5 mL.  Performed by: Personally  Anesthesiologist: Effie Berkshire, MD  Additional Notes: Patient tolerated the procedure well. Local anesthetic introduced in an incremental fashion under minimal resistance after negative aspirations. No paresthesias were elicited. After completion of the procedure, no acute issues were identified and patient continued to be monitored by RN.

## 2022-03-28 NOTE — Anesthesia Procedure Notes (Signed)
Anesthesia Regional Block: Adductor canal block   Pre-Anesthetic Checklist: , timeout performed,  Correct Patient, Correct Site, Correct Laterality,  Correct Procedure, Correct Position, site marked,  Risks and benefits discussed,  Surgical consent,  Pre-op evaluation,  At surgeon's request and post-op pain management  Laterality: Left  Prep: chloraprep       Needles:  Injection technique: Single-shot  Needle Type: Echogenic Stimulator Needle     Needle Length: 9cm  Needle Gauge: 21     Additional Needles:   Procedures:,,,, ultrasound used (permanent image in chart),,    Narrative:  Start time: 03/28/2022 8:10 AM End time: 03/28/2022 8:15 AM Injection made incrementally with aspirations every 5 mL.  Performed by: Personally  Anesthesiologist: Effie Berkshire, MD  Additional Notes: Patient tolerated the procedure well. Local anesthetic introduced in an incremental fashion under minimal resistance after negative aspirations. No paresthesias were elicited. After completion of the procedure, no acute issues were identified and patient continued to be monitored by RN.

## 2022-03-28 NOTE — Anesthesia Procedure Notes (Signed)
Procedure Name: LMA Insertion Date/Time: 03/28/2022 9:24 AM  Performed by: Darletta Moll, CRNAPre-anesthesia Checklist: Patient identified, Emergency Drugs available, Suction available and Patient being monitored Patient Re-evaluated:Patient Re-evaluated prior to induction Oxygen Delivery Method: Circle system utilized Preoxygenation: Pre-oxygenation with 100% oxygen Induction Type: IV induction Ventilation: Mask ventilation without difficulty LMA: LMA inserted LMA Size: 5.0 Number of attempts: 1 Airway Equipment and Method: Oral airway Placement Confirmation: positive ETCO2, breath sounds checked- equal and bilateral and CO2 detector Tube secured with: Tape Dental Injury: Teeth and Oropharynx as per pre-operative assessment

## 2022-03-28 NOTE — Transfer of Care (Signed)
Immediate Anesthesia Transfer of Care Note  Patient: Denton Meek Nanez  Procedure(s) Performed: LEFT ANKLE REMOVAL DEEP HARDWARE, REMOVE BONE AND APPLY TISSUE GRAFT (Left) APPLICATION OF WOUND VAC (Left: Ankle)  Patient Location: PACU  Anesthesia Type:General and Regional  Level of Consciousness: drowsy and patient cooperative  Airway & Oxygen Therapy: Patient Spontanous Breathing and Patient connected to nasal cannula oxygen  Post-op Assessment: Report given to RN, Post -op Vital signs reviewed and stable and Patient moving all extremities X 4  Post vital signs: Reviewed and stable  Last Vitals:  Vitals Value Taken Time  BP 140/58 03/28/22 1006  Temp    Pulse 50 03/28/22 1007  Resp 9 03/28/22 1007  SpO2 93 % 03/28/22 1007  Vitals shown include unvalidated device data.  Last Pain:  Vitals:   03/28/22 0816  TempSrc:   PainSc: 0-No pain      Patients Stated Pain Goal: 0 (64/15/83 0940)  Complications: No notable events documented.

## 2022-03-28 NOTE — Op Note (Signed)
03/28/2022  10:18 AM  PATIENT:  Eric Scott    PRE-OPERATIVE DIAGNOSIS:  Infected Hardware, bone, and Ulcer Left Ankle  POST-OPERATIVE DIAGNOSIS:  Same  PROCEDURE:  LEFT ANKLE REMOVAL DEEP HARDWARE,  Partial excision distal fibula. APPLY TISSUE GRAFT, 95 cm Kerecis micro powder. APPLICATION OF WOUND VAC, arthroform. Local tissue rearrangement for wound closure 15 x 3 cm.  SURGEON:  [name], MD  PHYSICIAN ASSISTANT:None ANESTHESIA:   General  PREOPERATIVE INDICATIONS:  Eric Scott is a  71 y.o. male with a diagnosis of Infected Hardware and Ulcer Left Ankle who failed conservative measures and elected for surgical management.    The risks benefits and alternatives were discussed with the patient preoperatively including but not limited to the risks of infection, bleeding, nerve injury, cardiopulmonary complications, the need for revision surgery, among others, and the patient was willing to proceed.  OPERATIVE IMPLANTS: Kerecis micro powder 95 cm.  '@ENCIMAGES'$ @  OPERATIVE FINDINGS: Patient had a destructive lesion of the distal fibula.  The bone soft tissue was sent for tissue cultures.  Patient has been on Rocephin and Zyvox.  OPERATIVE PROCEDURE: Patient was brought the operating room underwent a general anesthetic.  After adequate levels anesthesia obtained patient's left lower extremity was prepped using DuraPrep draped into a sterile field a timeout was called.  An elliptical incision was made around the ulcer over the fibula.  This left a wound that was 15 x 3 cm.  The fibrous tissue over the bone was debrided and this was sent for cultures.  The fibular plate and screws were removed without complication.  There is a large soft lesion of the distal fibula and the rem B was used to resect this back to bleeding viable bone.  This was further debrided with a Cobb elevator.  Tissue and bone was sent for cultures.  The wound was irrigated with normal saline hemostasis was  obtained.  The wound was filled with 95 cm of Kerecis micro powder.  The wound edges were undermined and local tissue rearrangement was used to close the wound 15 x 3 cm with 2-0 nylon.  The prominent wire in the medial malleolus was also removed the skin was incised and the wire was removed without complications.  The Ortho form wound VAC was applied this had a good suction fit this was overwrapped with Coban.  Patient was extubated taken the PACU in stable condition.   DISCHARGE PLANNING:  Antibiotic duration: Continue IV antibiotics with Rocephin and Zyvox.  Weightbearing: Weightbearing as tolerated bilateral lower extremities  Pain medication: Opioid pathway.  Dressing care/ Wound VAC: Wound VAC continue at discharge  Ambulatory devices: Walker.  Discharge to: Anticipate discharge back to skilled nursing.  Follow-up: In the office 1 week post operative.

## 2022-03-28 NOTE — Anesthesia Postprocedure Evaluation (Signed)
Anesthesia Post Note  Patient: [name]  Procedure(s) Performed: LEFT ANKLE REMOVAL DEEP HARDWARE, REMOVE BONE AND APPLY TISSUE GRAFT (Left) APPLICATION OF WOUND VAC (Left: Ankle)     Patient location during evaluation: PACU Anesthesia Type: General Level of consciousness: awake and alert Pain management: pain level controlled Vital Signs Assessment: post-procedure vital signs reviewed and stable Respiratory status: spontaneous breathing, nonlabored ventilation, respiratory function stable and patient connected to nasal cannula oxygen Cardiovascular status: blood pressure returned to baseline and stable Postop Assessment: no apparent nausea or vomiting Anesthetic complications: no   No notable events documented.  Last Vitals:  Vitals:   03/28/22 1300 03/28/22 1315  BP: (!) 133/53 137/61  Pulse: (!) 45 (!) 49  Resp: 12 13  Temp:    SpO2: 97% 98%    Last Pain:  Vitals:   03/28/22 1300  TempSrc:   PainSc: Asleep                 Eric Scott

## 2022-03-28 NOTE — Anesthesia Preprocedure Evaluation (Addendum)
Anesthesia Evaluation  Patient identified by MRN, date of birth, ID band Patient awake    Reviewed: Allergy & Precautions, NPO status , Patient's Chart, lab work & pertinent test results  Airway Mallampati: III     Mouth opening: Limited Mouth Opening Comment: L sided jaw pain, possible dental infection Dental  (+) Poor Dentition, Dental Advisory Given   Pulmonary COPD, former smoker,     + decreased breath sounds      Cardiovascular hypertension, + Peripheral Vascular Disease and +CHF   Rhythm:Regular Rate:Normal  Echo: 1. Left ventricular ejection fraction, by estimation, is 55 to 60%. The  left ventricle has normal function. Left ventricular endocardial border  not optimally defined to evaluate regional wall motion. There is severe  left ventricular hypertrophy. Left  ventricular diastolic parameters are indeterminate.  2. Right ventricular systolic function is normal. The right ventricular  size is normal.  3. The mitral valve was not well visualized. No evidence of mitral valve  regurgitation. No evidence of mitral stenosis.  4. The aortic valve was not well visualized. Aortic valve regurgitation  is not visualized. No aortic stenosis is present.  5. Aortic dilatation noted. 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  6. Technically difficult study, limited visualization    Neuro/Psych PSYCHIATRIC DISORDERS Anxiety Depression  Neuromuscular disease CVA    GI/Hepatic Neg liver ROS, PUD, GERD  ,  Endo/Other  diabetes  Renal/GU Renal disease     Musculoskeletal negative musculoskeletal ROS (+)   Abdominal (+) + obese,   Peds  Hematology negative hematology ROS (+)   Anesthesia Other Findings   Reproductive/Obstetrics                           Anesthesia Physical Anesthesia Plan  ASA: 3  Anesthesia Plan: General   Post-op Pain Management: Regional block*    Induction: Intravenous  PONV Risk Score and Plan: 3 and Ondansetron, Dexamethasone and Midazolam  Airway Management Planned: LMA  Additional Equipment: None  Intra-op Plan:   Post-operative Plan: Extubation in OR  Informed Consent: I have reviewed the patients History and Physical, chart, labs and discussed the procedure including the risks, benefits and alternatives for the proposed anesthesia with the patient or authorized representative who has indicated his/her understanding and acceptance.     Dental advisory given  Plan Discussed with: CRNA  Anesthesia Plan Comments:        Anesthesia Quick Evaluation

## 2022-03-29 ENCOUNTER — Encounter (HOSPITAL_COMMUNITY): Payer: Self-pay | Admitting: Orthopedic Surgery

## 2022-03-29 DIAGNOSIS — E1122 Type 2 diabetes mellitus with diabetic chronic kidney disease: Secondary | ICD-10-CM | POA: Diagnosis present

## 2022-03-29 DIAGNOSIS — T847XXA Infection and inflammatory reaction due to other internal orthopedic prosthetic devices, implants and grafts, initial encounter: Secondary | ICD-10-CM | POA: Diagnosis not present

## 2022-03-29 DIAGNOSIS — E1169 Type 2 diabetes mellitus with other specified complication: Secondary | ICD-10-CM | POA: Diagnosis present

## 2022-03-29 DIAGNOSIS — E1142 Type 2 diabetes mellitus with diabetic polyneuropathy: Secondary | ICD-10-CM | POA: Diagnosis present

## 2022-03-29 DIAGNOSIS — F32A Depression, unspecified: Secondary | ICD-10-CM | POA: Diagnosis present

## 2022-03-29 DIAGNOSIS — L97924 Non-pressure chronic ulcer of unspecified part of left lower leg with necrosis of bone: Secondary | ICD-10-CM | POA: Diagnosis not present

## 2022-03-29 DIAGNOSIS — E669 Obesity, unspecified: Secondary | ICD-10-CM | POA: Diagnosis present

## 2022-03-29 DIAGNOSIS — Z1622 Resistance to vancomycin related antibiotics: Secondary | ICD-10-CM | POA: Diagnosis present

## 2022-03-29 DIAGNOSIS — N184 Chronic kidney disease, stage 4 (severe): Secondary | ICD-10-CM | POA: Diagnosis present

## 2022-03-29 DIAGNOSIS — L97311 Non-pressure chronic ulcer of right ankle limited to breakdown of skin: Secondary | ICD-10-CM | POA: Diagnosis present

## 2022-03-29 DIAGNOSIS — J9621 Acute and chronic respiratory failure with hypoxia: Secondary | ICD-10-CM | POA: Diagnosis not present

## 2022-03-29 DIAGNOSIS — I4891 Unspecified atrial fibrillation: Secondary | ICD-10-CM | POA: Diagnosis not present

## 2022-03-29 DIAGNOSIS — I130 Hypertensive heart and chronic kidney disease with heart failure and stage 1 through stage 4 chronic kidney disease, or unspecified chronic kidney disease: Secondary | ICD-10-CM | POA: Diagnosis present

## 2022-03-29 DIAGNOSIS — M86172 Other acute osteomyelitis, left ankle and foot: Secondary | ICD-10-CM | POA: Insufficient documentation

## 2022-03-29 DIAGNOSIS — T380X5A Adverse effect of glucocorticoids and synthetic analogues, initial encounter: Secondary | ICD-10-CM | POA: Diagnosis not present

## 2022-03-29 DIAGNOSIS — I10 Essential (primary) hypertension: Secondary | ICD-10-CM | POA: Diagnosis not present

## 2022-03-29 DIAGNOSIS — L02416 Cutaneous abscess of left lower limb: Secondary | ICD-10-CM | POA: Diagnosis present

## 2022-03-29 DIAGNOSIS — I5033 Acute on chronic diastolic (congestive) heart failure: Secondary | ICD-10-CM | POA: Diagnosis not present

## 2022-03-29 DIAGNOSIS — D631 Anemia in chronic kidney disease: Secondary | ICD-10-CM | POA: Diagnosis present

## 2022-03-29 DIAGNOSIS — S81802S Unspecified open wound, left lower leg, sequela: Secondary | ICD-10-CM | POA: Diagnosis present

## 2022-03-29 DIAGNOSIS — E7800 Pure hypercholesterolemia, unspecified: Secondary | ICD-10-CM | POA: Diagnosis present

## 2022-03-29 DIAGNOSIS — G8929 Other chronic pain: Secondary | ICD-10-CM | POA: Diagnosis present

## 2022-03-29 DIAGNOSIS — E1151 Type 2 diabetes mellitus with diabetic peripheral angiopathy without gangrene: Secondary | ICD-10-CM | POA: Diagnosis present

## 2022-03-29 DIAGNOSIS — Z8616 Personal history of COVID-19: Secondary | ICD-10-CM | POA: Diagnosis not present

## 2022-03-29 DIAGNOSIS — J441 Chronic obstructive pulmonary disease with (acute) exacerbation: Secondary | ICD-10-CM | POA: Diagnosis not present

## 2022-03-29 DIAGNOSIS — I4892 Unspecified atrial flutter: Secondary | ICD-10-CM | POA: Diagnosis not present

## 2022-03-29 DIAGNOSIS — T84624A Infection and inflammatory reaction due to internal fixation device of right fibula, initial encounter: Secondary | ICD-10-CM | POA: Diagnosis present

## 2022-03-29 DIAGNOSIS — N179 Acute kidney failure, unspecified: Secondary | ICD-10-CM | POA: Diagnosis not present

## 2022-03-29 DIAGNOSIS — Y793 Surgical instruments, materials and orthopedic devices (including sutures) associated with adverse incidents: Secondary | ICD-10-CM | POA: Diagnosis present

## 2022-03-29 DIAGNOSIS — I480 Paroxysmal atrial fibrillation: Secondary | ICD-10-CM | POA: Diagnosis present

## 2022-03-29 LAB — GLUCOSE, CAPILLARY
Glucose-Capillary: 222 mg/dL — ABNORMAL HIGH (ref 70–99)
Glucose-Capillary: 169 mg/dL — ABNORMAL HIGH (ref 70–99)
Glucose-Capillary: 152 mg/dL — ABNORMAL HIGH (ref 70–99)
Glucose-Capillary: 168 mg/dL — ABNORMAL HIGH (ref 70–99)

## 2022-03-29 NOTE — Progress Notes (Addendum)
Patient ID: Eric Scott, male   DOB: Oct 06, 1950, 71 y.o.   MRN: [id number] Patient is postoperative day 1 removal of deep retained hardware left ankle with partial excision of the fibula placement of Kerecis tissue graft and a circumferential wound VAC.  There is 200 cc in the wound VAC canister with a good suction fit.  Cultures are pending.  Plan for discharge back to skilled nursing when the drainage has resolved.  Continue ceftriaxone.

## 2022-03-29 NOTE — Evaluation (Signed)
Physical Therapy Evaluation and Discharge Patient Details Name: Eric Scott MRN: [id number] DOB: 19-Aug-1950 Today's Date: 03/29/2022  History of Present Illness  Pt is 71 yo male who presents on 10/25 for ulcer and infected hardware L ankle and underwent hardware removal and wound vac placement. PMH: GERD, chronic LBP, COPD, DM2, peripheral neuropathy, HTN, IDA, PVD, CKD, CVA  Clinical Impression  Patient evaluated by Physical Therapy with no further acute PT needs identified. All education has been completed and the patient has no further questions. Pt from SNF where he needs a lift transfer from bed to w/c. Spends most of the day in w.c and propels with assist. Pt needed min A to roll in bed and mod A to come to EOB. Pt functional level comparable to before surgery, defer further PT to SNF upon return.  See below for any follow-up Physical Therapy or equipment needs. PT is signing off. Thank you for this referral.        Recommendations for follow up therapy are one component of a multi-disciplinary discharge planning process, led by the attending physician.  Recommendations may be updated based on patient status, additional functional criteria and insurance authorization.  Follow Up Recommendations Skilled nursing-short term rehab (<3 hours/day) Can patient physically be transported by private vehicle: No    Assistance Recommended at Discharge Intermittent Supervision/Assistance  Patient can return home with the following  A lot of help with bathing/dressing/bathroom;Assist for transportation    Equipment Recommendations None recommended by PT  Recommendations for Other Services       Functional Status Assessment Patient has had a recent decline in their functional status and demonstrates the ability to make significant improvements in function in a reasonable and predictable amount of time.     Precautions / Restrictions Precautions Precautions: Fall Precaution Comments: L  wound vac, RLE neuropathy Restrictions Weight Bearing Restrictions: Yes LLE Weight Bearing: Weight bearing as tolerated      Mobility  Bed Mobility Overal bed mobility: Needs Assistance Bed Mobility: Rolling, Sidelying to Sit, Sit to Supine Rolling: Min assist Sidelying to sit: Mod assist   Sit to supine: Min assist   General bed mobility comments: pt able to roll R and L with use of rail and min A to get into full SL. Mod A for LE's off bed and elevation of trunk into sitting. Min A to LE's for return to supine.    Transfers                   General transfer comment: pt is a lift tranfer to chair, prefers to sit EOB today    Ambulation/Gait               General Gait Details: unable  Stairs            Wheelchair Mobility    Modified Rankin (Stroke Patients Only)       Balance Overall balance assessment: Needs assistance Sitting-balance support: Bilateral upper extremity supported, Feet unsupported Sitting balance-Leahy Scale: Good                                       Pertinent Vitals/Pain Pain Assessment Pain Assessment: 0-10 Pain Score: 7  Pain Location: L ankle Pain Descriptors / Indicators: Sore, Tightness Pain Intervention(s): Limited activity within patient's tolerance, Premedicated before session, Monitored during session    Home Living Family/patient expects to be discharged  to:: Skilled nursing facility                        Prior Function Prior Level of Function : Needs assist             Mobility Comments: pt reports lift used for bed to w/c, has not been able to walk but hopes he can again one day. Propels manual w/c with assist, takes meals in room ADLs Comments: assist needed     Hand Dominance   Dominant Hand: Right    Extremity/Trunk Assessment   Upper Extremity Assessment Upper Extremity Assessment: Overall WFL for tasks assessed    Lower Extremity Assessment Lower Extremity  Assessment: RLE deficits/detail;LLE deficits/detail;Generalized weakness RLE Deficits / Details: B knee flex contractures, ROM approx 20-90 RLE Sensation: history of peripheral neuropathy RLE Coordination: decreased gross motor LLE Deficits / Details: wound vac, knee ROM limited as RLE LLE Sensation: history of peripheral neuropathy LLE Coordination: decreased gross motor    Cervical / Trunk Assessment Cervical / Trunk Assessment: Kyphotic  Communication   Communication: No difficulties  Cognition Arousal/Alertness: Awake/alert Behavior During Therapy: WFL for tasks assessed/performed Overall Cognitive Status: Within Functional Limits for tasks assessed                                          General Comments General comments (skin integrity, edema, etc.): reviewed stretching of knees into extension if he truly wants to progress to ambulation at some point. SPO2 in 90's on 2L O2, reports he uses O2 60% of time at home    Exercises     Assessment/Plan    PT Assessment All further PT needs can be met in the next venue of care  PT Problem List Decreased strength;Decreased range of motion;Decreased activity tolerance;Decreased balance;Decreased mobility;Decreased knowledge of use of DME;Decreased knowledge of precautions;Pain;Impaired sensation       PT Treatment Interventions Functional mobility training;Therapeutic activities;Therapeutic exercise    PT Goals (Current goals can be found in the Care Plan section)  Acute Rehab PT Goals Patient Stated Goal: walk again PT Goal Formulation: All assessment and education complete, DC therapy    Frequency       Co-evaluation               AM-PAC PT "6 Clicks" Mobility  Outcome Measure Help needed turning from your back to your side while in a flat bed without using bedrails?: A Little Help needed moving from lying on your back to sitting on the side of a flat bed without using bedrails?: A Lot Help needed  moving to and from a bed to a chair (including a wheelchair)?: Total Help needed standing up from a chair using your arms (e.g., wheelchair or bedside chair)?: Total Help needed to walk in hospital room?: Total Help needed climbing 3-5 steps with a railing? : Total 6 Click Score: 9    End of Session Equipment Utilized During Treatment: Oxygen Activity Tolerance: Patient tolerated treatment well Patient left: in bed;with call bell/phone within reach Nurse Communication: Mobility status PT Visit Diagnosis: Difficulty in walking, not elsewhere classified (R26.2);Muscle weakness (generalized) (M62.81);Pain Pain - Right/Left: Left Pain - part of body: Ankle and joints of foot    Time: 8366-2947 PT Time Calculation (min) (ACUTE ONLY): 20 min   Charges:   PT Evaluation $PT Eval Low Complexity: 1 Low  Leighton Roach, PT  Acute Rehab Services Secure chat preferred Office [place] 03/29/2022, 12:51 PM

## 2022-03-29 NOTE — TOC Initial Note (Signed)
Transition of Care [facility]) - Initial/Assessment Note    Patient Details  Name: Eric Scott MRN: [id number] Date of Birth: 09/29/1950  Transition of [facility]) CM/SW Contact:    Eric Chars, LCSW Phone Number: 03/29/2022, 4:21 PM  Clinical Narrative:    CSW met with pt regarding DC recommendation for SNF.  Pt is LTC resident at [facility], has been there 9 years, does want to return.  Permission given to speak with son Eric Scott and daughter Eric Scott.    CSW spoke with Eric Scott and she confirmed pt can return when ready.                 Expected Discharge Plan: [place] Barriers to Discharge: Continued Medical Work up   Patient Goals and CMS Choice Patient states their goals for this hospitalization and ongoing recovery are:: walk one of these days   Choice offered to / list presented to : Patient (Pt ha been at [facility] x9 years and wants to return)  Expected Discharge Plan and Services Expected Discharge Plan: [place] In-house Referral: Clinical Social Work   Post Acute Care Choice: Resumption of Svcs/PTA Provider Living arrangements for the past 2 months: [place]                                      Prior Living Arrangements/Services Living arrangements for the past 2 months: [place] Lives with:: Facility Resident Patient language and need for interpreter reviewed:: Yes Do you feel safe going back to the place where you live?: Yes      Need for Family Participation in Patient Care: No (Comment) Care giver support system in place?: Yes (comment) Current home services: Other (comment) (na) Criminal Activity/Legal Involvement Pertinent to Current Situation/Hospitalization: No - Comment as needed  Activities of Daily Living Home Assistive Devices/Equipment: Wheelchair, Eyeglasses ADL Screening (condition at time of admission) Patient's cognitive ability adequate to  safely complete daily activities?: Yes Is the patient deaf or have difficulty hearing?: No Does the patient have difficulty seeing, even when wearing glasses/contacts?: No Does the patient have difficulty concentrating, remembering, or making decisions?: No Patient able to express need for assistance with ADLs?: Yes Does the patient have difficulty dressing or bathing?: No Independently performs ADLs?: No Communication: Independent Dressing (OT): Needs assistance Is this a change from baseline?: Pre-admission baseline Grooming: Needs assistance Is this a change from baseline?: Pre-admission baseline Feeding: Needs assistance, Independent Is this a change from baseline?: Pre-admission baseline Bathing: Needs assistance Is this a change from baseline?: Pre-admission baseline Toileting: Dependent Is this a change from baseline?: Pre-admission baseline In/Out Bed: Dependent Is this a change from baseline?: Pre-admission baseline Walks in Home: Dependent Is this a change from baseline?: Pre-admission baseline Does the patient have difficulty walking or climbing stairs?: Yes Weakness of Legs: Both Weakness of Arms/Hands: Both  Permission Sought/Granted Permission sought to share information with : Family Supports Permission granted to share information with : Yes, Verbal Permission Granted  Share Information with NAME: Eric Scott, son, Eric Scott daughter           Emotional Assessment Appearance:: Appears stated age Attitude/Demeanor/Rapport: Engaged Affect (typically observed): Appropriate, Pleasant Orientation: : Oriented to Self, Oriented to Place, Oriented to  Time, Oriented to Situation      Admission diagnosis:  Leg wound, left, sequela [S81.802S] Osteomyelitis of ankle or foot, acute, left ([place]) [  M86.172] Patient Active Problem List   Diagnosis Date Noted   Osteomyelitis of ankle or foot, acute, left ([place]) 03/29/2022   Ulcer of left lower leg, with necrosis of bone ([place])  03/28/2022   Abscess of left lower leg 92/04/9416   Hardware complicating wound infection ([place]) 03/28/2022   Leg wound, left, sequela 03/28/2022   Abdominal pain    Cholelithiasis 05/12/2020   Elevated liver enzymes    RUQ pain    Bacteremia 05/11/2020   COPD  GOLD ?  / AB  01/26/2020   Acute on chronic respiratory failure with hypoxia ([name]) 12/18/2019   Respiratory failure with hypoxia ([place]) 11/17/2019   Normocytic anemia 11/16/2019   Cellulitis of both lower extremities 11/16/2019   Chronic respiratory failure with hypoxia ([place]) 05/22/2019   Leukocytosis 05/22/2019   Chronic heart failure with preserved ejection fraction (HFpEF) ([place]) 05/22/2019   Acute respiratory failure ([place]) 05/17/2019   Obesity, Class III, BMI 40-49.9 (morbid obesity) ([place]) 05/17/2019   Atrial flutter, paroxysmal ([place]) 05/17/2019   COPD with acute exacerbation ([place]) 04/20/2019   GERD (gastroesophageal reflux disease) 08/08/2018   PUD (peptic ulcer disease)    Gastric erosions 02/13/2017   Duodenal ulcer 02/13/2017   Transaminitis    UGI bleed    Arm DVT (deep venous thromboembolism), acute, right (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 ([place]) 11/15/2016   Arterial insufficiency of lower extremity ([name]) 11/15/2016   Pressure injury of skin 11/09/2016   Sepsis ([place]) 11/09/2016   Osteomyelitis ([place]) 11/09/2016   Wounds, multiple 11/08/2016   Chronic venous insufficiency 10/12/2016   Critical lower limb ischemia ([place]) 10/12/2016   Fatty liver 09/10/2016   History of colonic polyps 09/10/2016   Gallstone 09/10/2016   Edema 03/04/2015   AKI (acute kidney injury) ([place]) 03/04/2015   Fall 03/04/2015   Generalized weakness 03/04/2015   Diabetic polyneuropathy associated with type 2 diabetes mellitus ([place])    Anxiety     PVD (peripheral vascular disease) ([place])    Pneumonia 07/20/2014   Weight gain 05/02/2014   Coarse tremors 04/18/2014   Anxiety state 04/18/2014   Acute respiratory failure with hypoxia ([place]) 04/07/2014   Acute on chronic diastolic CHF (congestive heart failure) ([place]) 04/07/2014   Solitary pulmonary nodule 04/07/2014   COPD with exacerbation ([place]) 04/02/2014   Hypoxia 04/02/2014   Cerebral infarction ([place]) 06/01/2012   Chronic pain (back, legs) 05/30/2012   Toe laceration, 4th toe 05/30/2012   C. difficile diarrhea 05/30/2012   Hypokalemia 05/30/2012   Acute lacunar stroke ([place]) 05/27/2012   Type 2 diabetes mellitus with stage 4 chronic kidney disease, with [place]-term current use of insulin ([place]) 05/27/2012   Smoker 05/27/2012   HTN (hypertension) 05/27/2012   PCP:  [name], MD Pharmacy:   [place], [place] [address] [place] [address] Phone: [phone] Fax: [phone]  [place], [place] [address] [place] [address] Phone: [phone] Fax: [phone]  [place], [place] [address] [place] [address] Phone: [phone] Fax: [phone]     Social Determinants of Health (SDOH) Interventions    Readmission Risk Interventions     No data to display

## 2022-03-29 NOTE — NC FL2 (Signed)
[place] MEDICAID FL2 LEVEL OF CARE SCREENING TOOL     IDENTIFICATION  Patient Name: Eric Scott Birthdate: Oct 23, 1950 Sex: male Admission Date (Current Location): 03/28/2022  [place] and [place] Number:  Kathleen Argue [id number] [place] and Address:  The [place]. [facility], [place] [address], [place], [place] [address]      Provider Number: [id number]  Attending Physician Name and Address:  [name], MD  Relative Name and Phone Number:  Roye, Gustafson Son [phone]    Current Level of Care: Hospital Recommended Level of Care: [place] Prior Approval Number:    Date Approved/Denied:   PASRR Number: [id number]  Discharge Plan: SNF    Current Diagnoses: Patient Active Problem List   Diagnosis Date Noted   Osteomyelitis of ankle or foot, acute, left ([place]) 03/29/2022   Ulcer of left lower leg, with necrosis of bone ([place]) 03/28/2022   Abscess of left lower leg 45/62/5638   Hardware complicating wound infection ([place]) 03/28/2022   Leg wound, left, sequela 03/28/2022   Abdominal pain    Cholelithiasis 05/12/2020   Elevated liver enzymes    RUQ pain    Bacteremia 05/11/2020   COPD  GOLD ?  / AB  01/26/2020   Acute on chronic respiratory failure with hypoxia ([place]) 12/18/2019   Respiratory failure with hypoxia ([place]) 11/17/2019   Normocytic anemia 11/16/2019   Cellulitis of both lower extremities 11/16/2019   Chronic respiratory failure with hypoxia ([place]) 05/22/2019   Leukocytosis 05/22/2019   Chronic heart failure with preserved ejection fraction (HFpEF) ([place]) 05/22/2019   Acute respiratory failure ([place]) 05/17/2019   Obesity, Class III, BMI 40-49.9 (morbid obesity) ([place]) 05/17/2019   Atrial flutter, paroxysmal ([place]) 05/17/2019   COPD with acute exacerbation ([place]) 04/20/2019   GERD (gastroesophageal reflux disease) 08/08/2018   PUD (peptic ulcer disease)    Gastric erosions 02/13/2017   Duodenal ulcer 02/13/2017   Transaminitis     UGI bleed    Arm DVT (deep venous thromboembolism), acute, right ([place]) 12/18/2016   Uremia 12/10/2016   Acute renal failure ([name])    Volume depletion 11/23/2016   Hypotension    Liver enzyme elevation    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 ([place]) 11/15/2016   Arterial insufficiency of lower extremity ([place]) 11/15/2016   Pressure injury of skin 11/09/2016   Sepsis ([place]) 11/09/2016   Osteomyelitis ([place]) 11/09/2016   Wounds, multiple 11/08/2016   Chronic venous insufficiency 10/12/2016   Critical lower limb ischemia ([place]) 10/12/2016   Fatty liver 09/10/2016   History of colonic polyps 09/10/2016   Gallstone 09/10/2016   Edema 03/04/2015   AKI (acute kidney injury) ([place]) 03/04/2015   Fall 03/04/2015   Generalized weakness 03/04/2015   Diabetic polyneuropathy associated with type 2 diabetes mellitus ([place])    Anxiety    PVD (peripheral vascular disease) ([place])    Pneumonia 07/20/2014   Weight gain 05/02/2014   Coarse tremors 04/18/2014   Anxiety state 04/18/2014   Acute respiratory failure with hypoxia ([place]) 04/07/2014   Acute on chronic diastolic CHF (congestive heart failure) ([place]) 04/07/2014   Solitary pulmonary nodule 04/07/2014   COPD with exacerbation ([place]) 04/02/2014   Hypoxia 04/02/2014   Cerebral infarction ([place]) 06/01/2012   Chronic pain (back, legs) 05/30/2012   Toe laceration, 4th toe 05/30/2012   C. difficile diarrhea 05/30/2012   Hypokalemia 05/30/2012   Acute lacunar stroke ([place]) 05/27/2012   Type 2 diabetes mellitus with stage 4 chronic kidney  disease, with [place]-term current use of insulin ([place]) 05/27/2012   Smoker 05/27/2012   HTN (hypertension) 05/27/2012    Orientation RESPIRATION BLADDER Height & Weight     Self, Time, Situation, Place  O2 Incontinent Weight: 255 lb (115.7 kg) Height:  6' (182.9 cm)  BEHAVIORAL SYMPTOMS/MOOD NEUROLOGICAL BOWEL NUTRITION STATUS      Continent Diet (see discharge  summary)  AMBULATORY STATUS COMMUNICATION OF NEEDS Skin   Total Care Verbally Normal                       Personal Care Assistance Level of Assistance  Bathing, Feeding, Dressing Bathing Assistance: Maximum assistance Feeding assistance: Limited assistance Dressing Assistance: Maximum assistance     Functional Limitations Info  Sight, Hearing, Speech Sight Info: Adequate Hearing Info: Adequate Speech Info: Adequate    SPECIAL CARE FACTORS FREQUENCY  PT (By licensed PT), OT (By licensed OT)     PT Frequency: eval and treat OT Frequency: eval and treat            Contractures Contractures Info: Not present    Additional Factors Info  Code Status, Allergies, Insulin Sliding Scale Code Status Info: full Allergies Info: Baclofen, Blueberry Flavor, Cucumber Extract, Flexeril (Cyclobenzaprine Hcl), Kiwi Extract   Insulin Sliding Scale Info: novolog: see discharge summary       Current Medications (03/29/2022):  This is the current hospital active medication list Current Facility-Administered Medications  Medication Dose Route Frequency Provider Last Rate Last Admin   0.9 %  sodium chloride infusion   Intravenous Continuous [name], MD 10 mL/hr at 03/28/22 1828 Infusion Verify at 03/28/22 1828   acetaminophen (TYLENOL) tablet 325-650 mg  325-650 mg Oral Q6H PRN [name], MD       ALPRAZolam Duanne Moron) tablet 0.5 mg  0.5 mg Oral QHS [name], MD   0.5 mg at 03/28/22 2156   amLODipine (NORVASC) tablet 10 mg  10 mg Oral Daily [name], MD   10 mg at 03/29/22 1023   aspirin EC tablet 81 mg  81 mg Oral Q breakfast [name], MD   81 mg at 03/29/22 1022   bisacodyl (DULCOLAX) suppository 10 mg  10 mg Rectal Daily PRN [name], MD       calcitRIOL (ROCALTROL) capsule 0.25 mcg  0.25 mcg Oral Daily [name], MD   0.25 mcg at 03/29/22 1022   cefTRIAXone (ROCEPHIN) 1 g in sodium chloride 0.9 % 100 mL IVPB  1 g Intravenous Q24H [name], MD 200 mL/hr at 03/29/22 1035 1 g at 03/29/22 1035   colchicine tablet 0.6 mg  0.6 mg Oral Daily [name], MD   0.6 mg at 03/29/22 1022   docusate sodium (COLACE) capsule 100 mg  100 mg Oral BID [name], MD   100 mg at 03/29/22 1022   FLUoxetine (PROZAC) capsule 40 mg  40 mg Oral Daily [name], MD   40 mg at 03/29/22 1022   fluticasone furoate-vilanterol (BREO ELLIPTA) 100-25 MCG/ACT 1 puff  1 puff Inhalation Daily [name], MD       gabapentin (NEURONTIN) capsule 300 mg  300 mg Oral QHS [name], MD   300 mg at 03/28/22 2156   HYDROcodone-acetaminophen ([place]) 7.5-325 MG per tablet 1-2 tablet  1-2 tablet Oral Q4H PRN [name], MD   2 tablet at 03/28/22 2156   HYDROcodone-acetaminophen (NORCO/VICODIN)  5-325 MG per tablet 1-2 tablet  1-2 tablet Oral Q4H PRN [name], MD   2 tablet at 03/29/22 1022   insulin aspart (novoLOG) injection 0-15 Units  0-15 Units Subcutaneous TID WC [name], MD   3 Units at 03/29/22 1314   insulin aspart (novoLOG) injection 4 Units  4 Units Subcutaneous TID WC [name], MD   4 Units at 03/29/22 1315   insulin glargine-yfgn (SEMGLEE) injection 20 Units  20 Units Subcutaneous QHS [name], MD   20 Units at 03/28/22 2156   ipratropium-albuterol (DUONEB) 0.5-2.5 (3) MG/3ML nebulizer solution 3 mL  3 mL Nebulization Q4H PRN [name], MD       linezolid (ZYVOX) tablet 600 mg  600 mg Oral BID [name], MD   600 mg at 03/29/22 1022   magnesium citrate solution 1 Bottle  1 Bottle Oral Once PRN [name], MD       methocarbamol (ROBAXIN) tablet 500 mg  500 mg Oral Q6H PRN [name], MD   500 mg at 03/28/22 2156   Or   methocarbamol (ROBAXIN) 500 mg in dextrose 5 % 50 mL IVPB  500 mg Intravenous Q6H PRN [name], MD       metoCLOPramide (REGLAN) tablet 5-10 mg  5-10 mg Oral Q8H PRN [name], MD       Or   metoCLOPramide (REGLAN) injection 5-10 mg  5-10 mg Intravenous Q8H PRN [name], MD       metoprolol tartrate (LOPRESSOR) tablet 50 mg  50 mg Oral BID [name], MD   50 mg at 03/29/22 1022   morphine (PF) 2 MG/ML injection 0.5-1 mg  0.5-1 mg Intravenous Q2H PRN [name], MD   1 mg at 03/28/22 2000   ondansetron (ZOFRAN) tablet 4 mg  4 mg Oral Q6H PRN [name], MD       Or   ondansetron [facility]) injection 4 mg  4 mg Intravenous Q6H PRN [name], MD       polyethylene glycol (MIRALAX / GLYCOLAX) packet 17 g  17 g Oral Daily PRN [name], MD       tamsulosin [facility]) capsule 0.4 mg  0.4 mg Oral QPC supper [name], MD   0.4 mg at 03/28/22 1737     Discharge Medications: Please see discharge summary for a list of discharge medications.  Relevant Imaging Results:  Relevant Lab Results:   Additional Information SSN [id number]  Rose Fillers, Veronia Beets, LCSW

## 2022-03-30 DIAGNOSIS — N184 Chronic kidney disease, stage 4 (severe): Secondary | ICD-10-CM | POA: Diagnosis not present

## 2022-03-30 DIAGNOSIS — T847XXA Infection and inflammatory reaction due to other internal orthopedic prosthetic devices, implants and grafts, initial encounter: Secondary | ICD-10-CM | POA: Diagnosis not present

## 2022-03-30 DIAGNOSIS — L97924 Non-pressure chronic ulcer of unspecified part of left lower leg with necrosis of bone: Secondary | ICD-10-CM | POA: Diagnosis not present

## 2022-03-30 LAB — GLUCOSE, CAPILLARY
Glucose-Capillary: 86 mg/dL (ref 70–99)
Glucose-Capillary: 125 mg/dL — ABNORMAL HIGH (ref 70–99)
Glucose-Capillary: 129 mg/dL — ABNORMAL HIGH (ref 70–99)
Glucose-Capillary: 89 mg/dL (ref 70–99)

## 2022-03-30 MED ORDER — SODIUM CHLORIDE 0.9 % IV SOLN
500.0000 mg | Freq: Two times a day (BID) | INTRAVENOUS | Status: DC
  Administered 2022-03-30 – 2022-04-07 (×16): 500 mg via INTRAVENOUS
  Filled 2022-03-30 (×22): qty 10

## 2022-03-30 MED ORDER — SODIUM CHLORIDE 0.9 % IV SOLN
8.0000 mg/kg | INTRAVENOUS | Status: DC
  Administered 2022-03-30 – 2022-04-09 (×6): 750 mg via INTRAVENOUS
  Filled 2022-03-30 (×7): qty 15

## 2022-03-30 NOTE — Progress Notes (Signed)
Pharmacy Antibiotic Note  Eric Scott is a 71 y.o. male admitted on 03/28/2022 with  osteomyelitis of left ankle .  S/p debridement and removal of hardware on 03/28/22. Pharmacy has been consulted for Meropenem and Daptomycin dosing.  Changing from Linezolid and Cetriaxone with multiple organisms growing in operative culture.    CKD IV, last chemistries 10/25.  Plan: Meropenem 500 mg IV q12h. Daptomycin 750 mg (~8 mg/kg adjusted body weight) IV q48hrs. Follow renal function for any need to adjust dosing. Follow culture data, progress and antibiotic plans.  Height: 6' (182.9 cm) Weight: 115.7 kg (255 lb) IBW/kg (Calculated) : 77.6  Temp (24hrs), Avg:97.9 F (36.6 C), Min:97.6 F (36.4 C), Max:98.3 F (36.8 C)  Recent Labs  Lab 03/28/22 0803  WBC 10.6*  CREATININE 3.78*    Estimated Creatinine Clearance: 23.5 mL/min (A) (by C-G formula based on SCr of 3.78 mg/dL (H)).    Allergies  Allergen Reactions   Baclofen     unknown   Blueberry Flavor Other (See Comments)    Unknown   Cucumber Extract Other (See Comments)    "Fells like I'm having a heart attack"   Flexeril [Cyclobenzaprine Hcl] Other (See Comments)    "whole body  Tremors" (05/27/2012)   Kiwi Extract Other (See Comments)    "feels like I'm having a heart attack"    Antimicrobials this admission: Zyvox PO from PTA 10/24 >> 10/27 Ceftriaxone from PTA 10/24 (was IM outpt IV inpt) >> 10/27 Daptomycin 10/27 >> Meropenem 10/27 >>  Dose adjustments this admission: n/a  Microbiology results: 10/25 surgical wound cx: no organims on gram stain > recincubated > rare Acinetobacter, rare Morganella, rare Enterococcus, rare Staph aureus - sensitivities pending  Thank you for allowing pharmacy to be a part of this patient's care.  Arty Baumgartner, [place] 03/30/2022 3:59 PM

## 2022-03-30 NOTE — Consult Note (Signed)
[facility] for Infectious Disease    Date of Admission:  03/28/2022     Reason for Consult: Wound infection complicated by hardware     Referring Physician: Dr. Sharol Given  Current antibiotics: Ceftriaxone Linezolid  ASSESSMENT:    71 y.o. 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 Sharol Given.  Operative cultures are polymicrobial with Acinetobacter, Morganella, Enterococcus faecalis, and Staph aureus.  Susceptibilities are pending on all of these organisms at this time. CKD stage IV: Patient follows with nephrology as an outpatient.  Appears his renal function is at baseline.  RECOMMENDATIONS:    Patient with complicated hardware infection involving the bone status post hardware removal  Anticipate prolonged antibiotic course to treat similar to osteomyelitis Organisms growing have tendency to be multidrug-resistant particularly Acinetobacter Pending susceptibility results, will implement meropenem and daptomycin per pharmacy dosed for his chronic kidney disease Will narrow antibiotics as able, however, he may require IV antibiotics based on what is available Dr. Linus Salmons will be available to review cultures this weekend.  I will be back on Monday Baseline ESR/CRP   Principal Problem:   Leg wound, left, sequela Active Problems:   Ulcer of left lower leg, with necrosis of bone ([name])   Abscess of left lower leg   Hardware complicating wound infection ([place])   Osteomyelitis of ankle or foot, acute, left ([name])   MEDICATIONS:    Scheduled Meds: . ALPRAZolam  0.5 mg Oral QHS  . amLODipine  10 mg Oral Daily  . aspirin EC  81 mg Oral Q breakfast  . calcitRIOL  0.25 mcg Oral Daily  . colchicine  0.6 mg Oral Daily  . docusate sodium  100 mg Oral BID  . FLUoxetine  40 mg Oral Daily  . fluticasone furoate-vilanterol  1 puff Inhalation Daily  . gabapentin   300 mg Oral QHS  . insulin aspart  0-15 Units Subcutaneous TID WC  . insulin aspart  4 Units Subcutaneous TID WC  . insulin glargine-yfgn  20 Units Subcutaneous QHS  . metoprolol tartrate  50 mg Oral BID  . tamsulosin  0.4 mg Oral QPC supper   Continuous Infusions: . sodium chloride 10 mL/hr at 03/29/22 1625  . DAPTOmycin (CUBICIN) 750 mg in sodium chloride 0.9 % IVPB    . meropenem (MERREM) IV    . methocarbamol (ROBAXIN) IV     PRN Meds:.acetaminophen, bisacodyl, HYDROcodone-acetaminophen, HYDROcodone-acetaminophen, ipratropium-albuterol, magnesium citrate, methocarbamol **OR** methocarbamol (ROBAXIN) IV, metoCLOPramide **OR** metoCLOPramide (REGLAN) injection, morphine injection, ondansetron **OR** ondansetron (ZOFRAN) IV, polyethylene glycol  HPI:    Eric Scott is a 71 y.o. male with a past medical history as noted below who presented to Dr. Jess Barters office on 03/22/2022 for evaluation of a lateral malleoli or ulcer of the left ankle with exposure of deep and retained hardware.  Patient reported that he underwent prior internal fixation "a [place] time ago".  This ulcer has been present for about 3 months.  He lives [place]-term at a nursing facility.  Patient was taken to the OR with Dr. Sharol Given on 03/28/2022 due to infected hardware and deep ankle ulcer.  He underwent wound debridement down to the bone.  Previous hardware was also removed during this procedure.  His surgical cultures have come back as of this morning polymicrobial with multiple organisms including Acinetobacter, Morganella, Enterococcus faecalis, and Staph aureus.  We have been consulted for further recommendations.  At this time the patient reports pain in his ankle but otherwise is tolerating antibiotics with no nausea or vomiting.  He has a wound VAC in place.  He has no fevers.   Past Medical History:  Diagnosis Date  . Anemia   . Anxiety   . Chronic pain    legs, back; MRI 05/2012 with mild thoracic degenerative changes  no spinal stenosis   . 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 COPD (chronic obstructive pulmonary disease) ([place])   . COVID-19   . Depression   . Diabetic peripheral neuropathy ([place])    "chronic" (05/27/2012)  . Diastolic CHF ([place])   . Duodenal ulcer hemorrhage   . DVT (deep venous thrombosis) ([name])    right upper arm  . GERD (gastroesophageal reflux disease)   . Hypercholesteremia   . Hypertension   . Iron deficiency anemia   . Osteomyelitis of ankle ([place])   . Peripheral edema   . PVD (peripheral vascular disease) ([place])   . Renal disorder   . Spinal stenosis    mild lumbar (MRI 05/2012)-L2-L3 to L4-L5 , mild lumbar foraminal stenosis   . Stroke ([place]) 05/2012   Subacute, lacunar infarcts within the left basal ganglia and posterior limp of the left internal capsule/thalamus; "RUE; both feet weak" (05/27/2012)  . Tremor   . Type II diabetes mellitus ([name])     Social History   Tobacco Use  . Smoking status: Former    Packs/day: 2.00    Years: 54.00    Total pack years: 108.00    Types: Cigarettes    Quit date: 12/12/2019    Years since quitting: 2.2  . Smokeless tobacco: Never  Vaping Use  . Vaping Use: Never used  Substance Use Topics  . Alcohol use: No    Comment: 05/27/2012 "drank gallons and gallons 20 yr ago or so; last drink  at least 10 yr ago"  . Drug use: No    Family History  Problem Relation Age of Onset  . Colon cancer Neg Hx     Allergies  Allergen Reactions  . Baclofen     unknown  . Blueberry Flavor Other (See Comments)    Unknown  . Cucumber Extract Other (See Comments)    "Fells like I'm having a heart attack"  . Flexeril [Cyclobenzaprine Hcl] Other (See Comments)    "whole body  Tremors" (05/27/2012)  . Kiwi Extract Other (See Comments)    "feels like I'm having a heart attack"    Review of Systems  All other systems reviewed and are negative.   OBJECTIVE:   Blood pressure (!) 157/74, pulse 64, temperature 97.6 F  (36.4 C), temperature source Oral, resp. rate 17, height 6' (1.829 m), weight 115.7 kg, SpO2 95 %. Body mass index is 34.58 kg/m.  Physical Exam Constitutional:      Comments: Elderly, chronically ill-appearing man, lying in bed and in no acute distress  HENT:     Head: Normocephalic and atraumatic.  Eyes:     Extraocular Movements: Extraocular movements intact.     Conjunctiva/sclera: Conjunctivae normal.  Cardiovascular:     Rate and Rhythm: Normal rate and regular rhythm.  Pulmonary:     Effort: Pulmonary effort is normal. No respiratory distress.  Abdominal:     General: There is no distension.     Palpations: Abdomen is soft.  Musculoskeletal:     Cervical back: Normal range of motion and neck supple.  Comments: Wound VAC in place on the left lower extremity  Skin:    General: Skin is warm and dry.  Neurological:     General: No focal deficit present.     Mental Status: He is oriented to person, place, and time.  Psychiatric:        Mood and Affect: Mood normal.        Behavior: Behavior normal.      Lab Results: Lab Results  Component Value Date   WBC 10.6 (H) 03/28/2022   HGB 10.7 (L) 03/28/2022   HCT 32.6 (L) 03/28/2022   MCV 92.9 03/28/2022   PLT 264 03/28/2022    Lab Results  Component Value Date   NA 141 03/28/2022   K 4.1 03/28/2022   CO2 23 03/28/2022   GLUCOSE 124 (H) 03/28/2022   BUN 61 (H) 03/28/2022   CREATININE 3.78 (H) 03/28/2022   CALCIUM 8.5 (L) 03/28/2022   GFRNONAA 16 (L) 03/28/2022   GFRAA 16 (L) 12/21/2019    Lab Results  Component Value Date   ALT 88 (H) 05/14/2020   AST 102 (H) 05/14/2020   ALKPHOS 139 (H) 05/14/2020   BILITOT 1.6 (H) 05/14/2020       Component Value Date/Time   CRP 6.5 (H) 04/20/2019 0853       Component Value Date/Time   ESRSEDRATE 60 (H) 11/26/2016 9688    I have reviewed the micro and lab results in Epic.  Imaging: No results found.   Imaging independently reviewed in Epic.  Raynelle [place] for Infectious Disease [facility] [phone] pager 03/30/2022, 2:16 PM

## 2022-03-30 NOTE — Progress Notes (Signed)
Patient ID: Eric Scott, male   DOB: 06-08-1950, 71 y.o.   MRN: [id number] Patient is status post partial fibular excision as well as removal of deep retained hardware of the lateral ankle.  There is 275 cc in the wound VAC canister still with drainage.  Approximately 75 cc since yesterday.  Patient's wound cultures are showing multiple organisms with susceptibility to follow.  Patient will be discharged back to skilled nursing and I have consulted infectious disease to see if there will be an oral antibiotic that would cover him at discharge.

## 2022-03-31 DIAGNOSIS — S81802S Unspecified open wound, left lower leg, sequela: Secondary | ICD-10-CM

## 2022-03-31 LAB — BASIC METABOLIC PANEL
Anion gap: 11 (ref 5–15)
BUN: 57 mg/dL — ABNORMAL HIGH (ref 8–23)
CO2: 21 mmol/L — ABNORMAL LOW (ref 22–32)
Calcium: 8.7 mg/dL — ABNORMAL LOW (ref 8.9–10.3)
Chloride: 113 mmol/L — ABNORMAL HIGH (ref 98–111)
Creatinine, Ser: 3.61 mg/dL — ABNORMAL HIGH (ref 0.61–1.24)
GFR, Estimated: 17 mL/min — ABNORMAL LOW (ref >60)
Glucose, Bld: 101 mg/dL — ABNORMAL HIGH (ref 70–99)
Potassium: 3.7 mmol/L (ref 3.5–5.1)
Sodium: 145 mmol/L (ref 135–145)

## 2022-03-31 LAB — C-REACTIVE PROTEIN: CRP: 10.8 mg/dL — ABNORMAL HIGH (ref <1.0)

## 2022-03-31 LAB — GLUCOSE, CAPILLARY
Glucose-Capillary: 105 mg/dL — ABNORMAL HIGH (ref 70–99)
Glucose-Capillary: 120 mg/dL — ABNORMAL HIGH (ref 70–99)
Glucose-Capillary: 107 mg/dL — ABNORMAL HIGH (ref 70–99)
Glucose-Capillary: 217 mg/dL — ABNORMAL HIGH (ref 70–99)

## 2022-03-31 LAB — CK: Total CK: 36 U/L — ABNORMAL LOW (ref 49–397)

## 2022-03-31 LAB — SEDIMENTATION RATE: Sed Rate: 49 mm/hr — ABNORMAL HIGH (ref 0–16)

## 2022-03-31 NOTE — Progress Notes (Signed)
    [facility] for Infectious Disease   Reason for visit: Follow up on deep   Interval History: no fever.   Physical Exam: Constitutional:  Vitals:   03/31/22 0757 03/31/22 0933  BP: (!) 136/57   Pulse: 73 90  Resp: 15 18  Temp: 98.7 F (37.1 C)   SpO2: 97% (!) 89%    Impression: infected hardware s/p removal.  Multiple organisms growing.  Sensitivities pending  Plan: 1.  Continue daptomycin and meropenem and will potentially narrow depending on sensitivities.

## 2022-03-31 NOTE — Progress Notes (Signed)
Patient ID: Eric Scott, male   DOB: [birth year], 71 y.o.   MRN: [id number] Patient is status post surgery left ankle for chronic infected deep retained hardware.  Cultures are showing multiple organisms.  There is 375 cc of drainage in the wound VAC canister.  Patient is currently on meropenem.  Thank you for infectious disease consult.  Plan for discharge back to skilled nursing when antibiotic coverage is finalized and drainage has resolved.

## 2022-03-31 NOTE — Plan of Care (Signed)
  Problem: Skin Integrity: Goal: Risk for impaired skin integrity will decrease Outcome: Progressing   Problem: Education: Goal: Ability to describe self-care measures that may prevent or decrease complications (Diabetes Survival Skills Education) will improve Outcome: Progressing   Problem: Nutrition: Goal: Adequate nutrition will be maintained Outcome: Progressing

## 2022-04-01 ENCOUNTER — Inpatient Hospital Stay (HOSPITAL_COMMUNITY): Payer: Medicare (Managed Care)

## 2022-04-01 LAB — GLUCOSE, CAPILLARY
Glucose-Capillary: 124 mg/dL — ABNORMAL HIGH (ref 70–99)
Glucose-Capillary: 153 mg/dL — ABNORMAL HIGH (ref 70–99)
Glucose-Capillary: 142 mg/dL — ABNORMAL HIGH (ref 70–99)
Glucose-Capillary: 126 mg/dL — ABNORMAL HIGH (ref 70–99)
Glucose-Capillary: 207 mg/dL — ABNORMAL HIGH (ref 70–99)

## 2022-04-01 MED ORDER — FUROSEMIDE 10 MG/ML IJ SOLN
20.0000 mg | Freq: Once | INTRAMUSCULAR | Status: AC
  Administered 2022-04-01: 20 mg via INTRAVENOUS
  Filled 2022-04-01: qty 2

## 2022-04-01 NOTE — Progress Notes (Signed)
   04/01/22 1847  Assess: MEWS Score  Temp 97.9 F (36.6 C)  BP (!) 155/59  MAP (mmHg) 87  Pulse Rate 79  ECG Heart Rate 80  Resp (!) 27  Level of Consciousness Alert  SpO2 100 %  O2 Device Bi-PAP  FiO2 (%) 30 %  Assess: MEWS Score  MEWS Temp 0  MEWS Systolic 0  MEWS Pulse 0  MEWS RR 2  MEWS LOC 0  MEWS Score 2  MEWS Score Color Yellow  Assess: if the MEWS score is Yellow or Red  Were vital signs taken at a resting state? Yes  Focused Assessment Change from prior assessment (see assessment flowsheet)  Does the patient meet 2 or more of the SIRS criteria? Yes  Does the patient have a confirmed or suspected source of infection? Yes  Provider and Rapid Response Notified? Yes (ortho on call paged)  MEWS guidelines implemented *See Row Information* Yes  Treat  Pain Scale 0-10  Pain Score 4  Pain Type Surgical pain  Pain Location Foot  Pain Orientation Left  Pain Intervention(s) Emotional support  Take Vital Signs  Increase Vital Sign Frequency  Yellow: Q 2hr X 2 then Q 4hr X 2, if remains yellow, continue Q 4hrs  Escalate  MEWS: Escalate Yellow: discuss with charge nurse/RN and consider discussing with provider and RRT  Notify: Charge Nurse/RN  Name of Charge Nurse/RN Notified Heather, RN  Date Charge Nurse/RN Notified 04/01/22  Time Charge Nurse/RN Notified 1832  Notify: Provider  Provider Name/Title Otho on call paged (Awaiting phone call return)  Date Provider Notified 04/01/22  Time Provider Notified 1845  Method of Notification Page  Notification Reason Change in status  Provider response Other (Comment) (Waiting on MD response - shift change and will report to next RN)  Notify: Rapid Response  Name of Rapid Response RN Notified Saralyn Pilar  Date Rapid Response Notified 04/01/22  Time Rapid Response Notified 1800  Document  Patient Outcome Stabilized after interventions  Progress note created (see row info) Yes  Assess: SIRS CRITERIA  SIRS Temperature  0   SIRS Pulse 0  SIRS Respirations  1  SIRS WBC 1  SIRS Score Sum  2

## 2022-04-01 NOTE — Progress Notes (Signed)
Subjective: 4 Days Post-Op Procedure(s) (LRB): LEFT ANKLE REMOVAL DEEP HARDWARE, REMOVE BONE AND APPLY TISSUE GRAFT (Left) APPLICATION OF WOUND VAC (Left) Patient reports pain as moderate.    Objective: Vital signs in last 24 hours: Temp:  [98.4 F (36.9 C)-99.1 F (37.3 C)] 99.1 F (37.3 C) (10/29 0826) Pulse Rate:  [58-90] 77 (10/29 0826) Resp:  [16-22] 16 (10/29 0826) BP: (147-172)/(51-107) 153/51 (10/29 0826) SpO2:  [89 %-98 %] 93 % (10/29 0826)  Intake/Output from previous day: 10/28 0701 - 10/29 0700 In: 947.3 [P.O.:240; I.V.:442.3; IV Piggyback:265] Out: 75 [Drains:75] Intake/Output this shift: No intake/output data recorded.  No results for input(s): "HGB" in the last 72 hours. No results for input(s): "WBC", "RBC", "HCT", "PLT" in the last 72 hours. Recent Labs    03/31/22 0545  NA 145  K 3.7  CL 113*  CO2 21*  BUN 57*  CREATININE 3.61*  GLUCOSE 101*  CALCIUM 8.7*   No results for input(s): "LABPT", "INR" in the last 72 hours.  PE Ankle wrapped.  Wound vac in place and functioning well with around 300 cc blood in canister   Assessment/Plan: 4 Days Post-Op Procedure(s) (LRB): LEFT ANKLE REMOVAL DEEP HARDWARE, REMOVE BONE AND APPLY TISSUE GRAFT (Left) APPLICATION OF WOUND VAC (Left) WBAT LLE Elevate for pain and swelling Continue daptomycin and meropenem per ID Continue with wound vac     Eric Scott 04/01/2022, 8:39 AM

## 2022-04-01 NOTE — Progress Notes (Signed)
Pt noted to have increased work of breathing.  PRN neb administered.  Pt stated he felt short of breath.  Rapid response and RT consulted - both at bedside.  Message sent to Dr. Sharol Given- no response.  Attempting to contact ortho MD on call.  Paged office and awaiting return call.

## 2022-04-01 NOTE — Progress Notes (Signed)
Pt transported from 5N10 to 2C14 on Bipap with no complications.

## 2022-04-01 NOTE — Progress Notes (Signed)
Pt declined calling and updating family on transfer.

## 2022-04-01 NOTE — Significant Event (Signed)
Rapid Response Event Note   Reason for Call :  Patient reported shortness of breath that has not improved after 2 breathing treatments  Initial Focused Assessment:  Patient in no acute distress at the moment. No audible wheezes auscultated. Diminished lung sounds right lower lobe. Patient endorses that his breathing feels better after being placed on bipap  BP 155/59 HR 90 O2 97 RR 19   Interventions:  Patient placed on bipap 2 Duoneb treatments given Chest xray obtained Patient placed on telemetry   Plan of Care:  Patient to remain on bipap Patient to transfer to progressive care Patient to receive 20 mg of Lasix    Event Summary:   MD Notified: Dwana Melena PA Call Time: 1900 Arrival Time: 2591. End Time: 1920  Venetia Maxon, RN

## 2022-04-02 ENCOUNTER — Encounter (HOSPITAL_COMMUNITY): Payer: Self-pay | Admitting: Orthopedic Surgery

## 2022-04-02 DIAGNOSIS — F39 Unspecified mood [affective] disorder: Secondary | ICD-10-CM | POA: Diagnosis present

## 2022-04-02 DIAGNOSIS — M86172 Other acute osteomyelitis, left ankle and foot: Secondary | ICD-10-CM

## 2022-04-02 DIAGNOSIS — S81802S Unspecified open wound, left lower leg, sequela: Secondary | ICD-10-CM | POA: Diagnosis not present

## 2022-04-02 DIAGNOSIS — N184 Chronic kidney disease, stage 4 (severe): Secondary | ICD-10-CM | POA: Diagnosis not present

## 2022-04-02 DIAGNOSIS — T847XXA Infection and inflammatory reaction due to other internal orthopedic prosthetic devices, implants and grafts, initial encounter: Secondary | ICD-10-CM | POA: Diagnosis not present

## 2022-04-02 LAB — BASIC METABOLIC PANEL
Anion gap: 11 (ref 5–15)
BUN: 57 mg/dL — ABNORMAL HIGH (ref 8–23)
CO2: 22 mmol/L (ref 22–32)
Calcium: 8.6 mg/dL — ABNORMAL LOW (ref 8.9–10.3)
Chloride: 110 mmol/L (ref 98–111)
Creatinine, Ser: 3.4 mg/dL — ABNORMAL HIGH (ref 0.61–1.24)
GFR, Estimated: 19 mL/min — ABNORMAL LOW (ref >60)
Glucose, Bld: 194 mg/dL — ABNORMAL HIGH (ref 70–99)
Potassium: 3.7 mmol/L (ref 3.5–5.1)
Sodium: 143 mmol/L (ref 135–145)

## 2022-04-02 LAB — GLUCOSE, CAPILLARY
Glucose-Capillary: 139 mg/dL — ABNORMAL HIGH (ref 70–99)
Glucose-Capillary: 151 mg/dL — ABNORMAL HIGH (ref 70–99)
Glucose-Capillary: 110 mg/dL — ABNORMAL HIGH (ref 70–99)
Glucose-Capillary: 132 mg/dL — ABNORMAL HIGH (ref 70–99)
Glucose-Capillary: 112 mg/dL — ABNORMAL HIGH (ref 70–99)

## 2022-04-02 LAB — CBC
HCT: 26.1 % — ABNORMAL LOW (ref 39.0–52.0)
Hemoglobin: 8.5 g/dL — ABNORMAL LOW (ref 13.0–17.0)
MCH: 31 pg (ref 26.0–34.0)
MCHC: 32.6 g/dL (ref 30.0–36.0)
MCV: 95.3 fL (ref 80.0–100.0)
Platelets: 198 10*3/uL (ref 150–400)
RBC: 2.74 MIL/uL — ABNORMAL LOW (ref 4.22–5.81)
RDW: 14.9 % (ref 11.5–15.5)
WBC: 9.5 10*3/uL (ref 4.0–10.5)
nRBC: 0 % (ref 0.0–0.2)

## 2022-04-02 LAB — AEROBIC/ANAEROBIC CULTURE W GRAM STAIN (SURGICAL/DEEP WOUND): Gram Stain: NONE SEEN

## 2022-04-02 MED ORDER — IPRATROPIUM-ALBUTEROL 0.5-2.5 (3) MG/3ML IN SOLN
3.0000 mL | Freq: Four times a day (QID) | RESPIRATORY_TRACT | Status: DC
  Administered 2022-04-02 – 2022-04-07 (×18): 3 mL via RESPIRATORY_TRACT
  Filled 2022-04-02 (×19): qty 3

## 2022-04-02 MED ORDER — ALBUTEROL SULFATE (2.5 MG/3ML) 0.083% IN NEBU
2.5000 mg | INHALATION_SOLUTION | RESPIRATORY_TRACT | Status: DC | PRN
  Administered 2022-04-06 – 2022-04-10 (×2): 2.5 mg via RESPIRATORY_TRACT
  Filled 2022-04-02 (×2): qty 3

## 2022-04-02 NOTE — Progress Notes (Signed)
Patient's family came out of patient's room and stated patient needed to go back on his Bi-Pap. Upon accessing patient he was wearing O2 on his HF 7L sats 92%, however, patient stated he was unable to breath. Breath sounds remain diminished. Gave patient his nightly meds and RT came and placed patient on Bi-Pap. Call bell is within reach of patient. Monitoring closely.

## 2022-04-02 NOTE — Progress Notes (Signed)
Patient Status: [facility] - In-pt  Assessment and Plan: Patient in need of venous access for home antibiotics.  Patient with history of osteo of the fibular plate s/p hardware removal. Recovery was progressing with intent to discharge soon with home IV antibiotics. Tunneled central line was requested due to history of renal dysfunction.  Patient with respiratory distress overnight 10/29, on bipap most of the day today.  Discussed with RN. One episode of desaturation while off bipap.  Patient not appropriate for line placement today.   Will continue to work towards line placement as able.  Will need consent.   ______________________________________________________________________   History of Present Illness: Eric Scott is a 71 y.o. 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  He is now s/p hardware removal.  ID following initially, however patient has responded well to IV abx.  IR consulted for line placement for IV abx after discharge back to SNF.  Patient with respiratory distress overnight.  PA to bedside this afternoon.  Sleeping soundly on bipap.  Per RN has been on bipap the majority of the day- 1 episode of desat off bi-pap vs. For patient comfort. Will need to pursue line placement once patient able to lie flat.   Allergies and medications reviewed.   Review of Systems: A 12 point ROS discussed and pertinent positives are indicated in the HPI above.  All other systems are negative.  Review of Systems  Unable to perform ROS: Acuity of condition    Vital Signs: BP 133/65 (BP Location: Left Arm)   Pulse (!) 58   Temp 99.6 F (37.6 C) (Axillary)   Resp 16   Ht 6' (1.829 m)   Wt 255 lb (115.7 kg)   SpO2 98%   BMI 34.58 kg/m   Physical Exam Vitals and nursing note reviewed.  Constitutional:      General: He is not in acute distress.    Appearance: Normal  appearance. He is ill-appearing.  HENT:     Mouth/Throat:     Mouth: Mucous membranes are moist.     Pharynx: Oropharynx is clear.  Cardiovascular:     Rate and Rhythm: Normal rate.  Pulmonary:     Effort: Pulmonary effort is normal.     Comments: On bipap Neurological:     General: No focal deficit present.     Mental Status: He is alert and oriented to person, place, and time. Mental status is at baseline.  Psychiatric:        Mood and Affect: Mood normal.        Behavior: Behavior normal.        Thought Content: Thought content normal.        Judgment: Judgment normal.      Imaging reviewed.   Labs:  COAGS: No results for input(s): "INR", "APTT" in the last 8760 hours.  BMP: Recent Labs    03/28/22 0803 03/31/22 0545 04/02/22 0018  NA 141 145 143  K 4.1 3.7 3.7  CL 105 113* 110  CO2 23 21* 22  GLUCOSE 124* 101* 194*  BUN 61* 57* 57*  CALCIUM 8.5* 8.7* 8.6*  CREATININE 3.78* 3.61* 3.40*  GFRNONAA 16* 17* 19*       Electronically Signed: Docia Barrier, PA 04/02/2022, 4:34 PM   I spent a total of 15 minutes in face to face in clinical consultation, greater than 50% of which  was counseling/coordinating care for venous access.

## 2022-04-02 NOTE — Assessment & Plan Note (Signed)
-  Continue fluoxetine, alprazolam

## 2022-04-02 NOTE — Progress Notes (Signed)
Patient ID: Eric Scott, male   DOB: 1951-04-06, 71 y.o.   MRN: [id number] Patient is status post surgical excision of bone and deep retained hardware for infection left ankle.  Patient has 375 cc from the wound VAC canister.  Patient had an acute shortness of breath yesterday denies any chest pain.  Patient was transferred to stepdown unit.  Patient has no complaints this morning.  Will consult hospitalist to assist in his medical care.  Patient will discharge back to skilled nursing when he is stable.

## 2022-04-02 NOTE — Progress Notes (Signed)
PHARMACY CONSULT NOTE FOR:  OUTPATIENT  PARENTERAL ANTIBIOTIC THERAPY (OPAT)  Informational only as the patient is planned to discharge to SNF and will receive antibiotics there  Indication: Polymicrobial L-ankle osteo Regimen: Daptomycin 750 mg IV every 48 hours and Meropenem 500 mg IV every 12 hours End date: 05/09/22  IV antibiotic discharge orders are pended. To discharging provider:  please sign these orders via discharge navigator,  Select New Orders & click on the button choice - Manage This Unsigned Work.     Thank you for allowing pharmacy to be a part of this patient's care.  Alycia Rossetti, PharmD, BCPS Infectious Diseases Clinical Pharmacist 04/02/2022 3:31 PM   **Pharmacist phone directory can now be found on [email or web address] ([email or web address]).  Listed under [place].

## 2022-04-02 NOTE — Assessment & Plan Note (Signed)
-  A1c is 5.8, indicating good control -Continue glargine -Cover with moderate-scale SSI

## 2022-04-02 NOTE — Assessment & Plan Note (Addendum)
-  Patient with hardware infection with osteo -He is s/p hardware removal -Culture is growing Morganella, Acinetobacter, E faecalis, and MRSA -ID has been consulting and has arranged for PICC placement for Daptomycin and Meropenem -Continue ASA, as he needs good circulation to achieve healing

## 2022-04-02 NOTE — Progress Notes (Addendum)
Pharmacy Antibiotic Note  Eric Scott is a 71 y.o. male admitted on 03/28/2022 with  osteomyelitis of left ankle s/p debridement and removal of hardware on 03/28/22. Pharmacy has been consulted for Meropenem and Daptomycin dosing.  Changing from Linezolid and Cetriaxone with multiple organisms growing in operative culture.  Renal function stable, ClCr ~57m/min.  Plan: Meropenem 500 mg IV q12h Daptomycin 750 mg (~8 mg/kg adjusted body weight) IV q48hrs. Monitor cultures, clinical status, renal function, CK Q Fri F/u ID recommendations and duration    Height: 6' (182.9 cm) Weight: 115.7 kg (255 lb) IBW/kg (Calculated) : 77.6  Temp (24hrs), Avg:98.5 F (36.9 C), Min:97.7 F (36.5 C), Max:99.4 F (37.4 C)  Recent Labs  Lab 03/28/22 0803 03/31/22 0545 04/02/22 0018  WBC 10.6*  --  9.5  CREATININE 3.78* 3.61* 3.40*     Estimated Creatinine Clearance: 26.2 mL/min (A) (by C-G formula based on SCr of 3.4 mg/dL (H)).    Allergies  Allergen Reactions   Baclofen     unknown   Blueberry Flavor Other (See Comments)    Unknown   Cucumber Extract Other (See Comments)    "Fells like I'm having a heart attack"   Flexeril [Cyclobenzaprine Hcl] Other (See Comments)    "whole body  Tremors" (05/27/2012)   Kiwi Extract Other (See Comments)    "feels like I'm having a heart attack"    Antimicrobials this admission: Zyvox PO from PTA 10/24 >> 10/27 Ceftriaxone from PTA 10/24 (was IM outpt IV inpt) >> 10/27 Daptomycin 10/27 >> Meropenem 10/27 >>  CK 10/28: 36   Microbiology results: 10/25 surgical wound cx: no organims on gram stain > recincubated > rare Acinetobacter, rare Morganella, rare E faecalis, rare MRSA  Thank you for allowing pharmacy to be a part of this patient's care.  LBenetta Spar PharmD, BCPS, BCCP Clinical Pharmacist  Please check AMION for all [place] numbers After 10:00 PM, call [phone]

## 2022-04-02 NOTE — Progress Notes (Signed)
Patient in no distress at this time. Vitals stable on 6 Lpm [place].

## 2022-04-02 NOTE — Assessment & Plan Note (Addendum)
-  Patient with COPD, on 3L home O2 -He has a respiratory distress the last 2 nights requiring BIPAP -Continue home Breo, Duonebs -CXR yesterday with concern for developing HCAP -Antibiotic coverage should be appropriate without change at this time -He likely also has worsening deconditioning and needs to use IS -Continue prn BIPAP

## 2022-04-02 NOTE — Assessment & Plan Note (Signed)
-  He has chronic pain in addition to acute pain -Cannot review PDMP since he is in a SNF -Continue Norco, Neurontin or as per orthopedics

## 2022-04-02 NOTE — Consult Note (Signed)
Initial Consultation Note   Patient: Eric Scott GEX:[id number] DOB: [birth year] PCP: [name], MD DOA: 03/28/2022 DOS: the patient was seen and examined on 04/02/2022 Primary service: [name], MD  Referring physician: Sharol Given - orthopedics Reason for consult: SNF patient with osteo of the fibular plate, removed plate and ID is following.  Last night with respiratory distress, placed in SDU.  No chest pain.  Antibiotics should be appropriate for wound (meropenem).  Goal is getting back to SNF.  Seems to be failing medically.      Assessment and Plan: * Leg wound, left, sequela -Patient with hardware infection with osteo -He is s/p hardware removal -Culture is growing Morganella, Acinetobacter, E faecalis, and MRSA -ID has been consulting and has arranged for PICC placement for Daptomycin and Meropenem -Continue ASA, as he needs good circulation to achieve healing  Acute on chronic respiratory failure with hypoxia ([place]) -Patient with COPD, on 3L home O2 -He has a respiratory distress the last 2 nights requiring BIPAP -Continue home Breo, Duonebs -CXR yesterday with concern for developing HCAP -Antibiotic coverage should be appropriate without change at this time -He likely also has worsening deconditioning and needs to use IS -Continue prn BIPAP  Chronic pain (back, legs) -He has chronic pain in addition to acute pain -Cannot review PDMP since he is in a SNF -Continue Norco, Neurontin or as per orthopedics  Stage 4 chronic kidney disease ([place]) -Appears to be stable at this time -Attempt to avoid nephrotoxic medications -Recheck BMP periodically -Continue calcitriol -Continue colchicine for gout  Mood disorder ([name]) -Continue fluoxetine, alprazolam  HTN (hypertension) -Continue home amlodipine, metoprolol  Type 2 diabetes mellitus with stage 4 chronic kidney disease, with [place]-term current use of insulin ([name]) -A1c is 5.8, indicating good control -Continue  glargine -Cover with moderate-scale SSI      TRH will continue to follow the patient.  HPI: DARDAN SHELTON is a 71 y.o. male with past medical history of chronic pain; stage 4 CKD; COPD on 3L home O2; chronic diastolic CHF; HTN; HLD; and DM who presented on 10/25 for L ankle deep hardware removal.  He reports that he has been in a SNF for about 8-9 years.  He has been having LLE pain and an unhealing ulcer for several months.  He was planned for removal of hardware on 10/25.  Since then, he has had overnight SOB the last 2 nights.  He was placed on BIPAP overnight and was back on [place] O2 at the time of my evaluation but did report that SOB was returning.  He has a chronic tremor, is not aware of ever being evaluated for Parkinson's, does not have known rigidity.      Review of Systems: As mentioned in the history of present illness. All other systems reviewed and are negative. Past Medical History:  Diagnosis Date   Anemia    Anxiety    Chronic pain    legs, back; MRI 05/2012 with mild thoracic degenerative changes no spinal stenosis    CKD (chronic kidney disease) 05/12/2019   Stage IV   COPD (chronic obstructive pulmonary disease) ([place])    COVID-19    Depression    Diabetic peripheral neuropathy ([name])    "chronic" (06/06/7251)   Diastolic CHF ([name])    Duodenal ulcer hemorrhage    DVT (deep venous thrombosis) ([name])    right upper arm   GERD (gastroesophageal reflux disease)    Hypercholesteremia    Hypertension  Iron deficiency anemia    Osteomyelitis of ankle ([name])    Peripheral edema    PVD (peripheral vascular disease) ([place])    Spinal stenosis    mild lumbar (MRI 05/2012)-L2-L3 to L4-L5 , mild lumbar foraminal stenosis    Stroke ([place]) 05/2012   Subacute, lacunar infarcts within the left basal ganglia and posterior limp of the left internal capsule/thalamus; "RUE; both feet weak" (05/27/2012)   Tremor    Type II diabetes mellitus ([place])    Past Surgical History:   Procedure Laterality Date   ANKLE SURGERY     APPLICATION OF WOUND VAC Left 03/28/2022   Procedure: APPLICATION OF WOUND VAC;  Surgeon: [name], MD;  Location: [facility];  Service: Orthopedics;  Laterality: Left;   ESOPHAGOGASTRODUODENOSCOPY (EGD) WITH PROPOFOL N/A 12/21/2016   [place]: Ulcerative reflux esophagitis, erosive gastropathy, extensive duodenal ulceration likely site of bleeding.  Pathology with mild gastritis, no H. pylori   ESOPHAGOGASTRODUODENOSCOPY (EGD) WITH PROPOFOL N/A 04/02/2017   Fields: ESOPAHGITIS/GASTRIC AND DUODENAL ULCERS HEALED. mild duodenitis   HARDWARE REMOVAL Left 03/28/2022   Procedure: LEFT ANKLE REMOVAL DEEP HARDWARE, REMOVE BONE AND APPLY TISSUE GRAFT;  Surgeon: [name], MD;  Location: [facility];  Service: Orthopedics;  Laterality: Left;   HERNIA REPAIR  01/05/2004   "belly button" (05/27/2012)   Social History:  reports that he quit smoking about 2 years ago. His smoking use included cigarettes. He has a 108.00 pack-year smoking history. He has never used smokeless tobacco. He reports that he does not drink alcohol and does not use drugs.  Allergies  Allergen Reactions   Baclofen     unknown   Blueberry Flavor Other (See Comments)    Unknown   Cucumber Extract Other (See Comments)    "Fells like I'm having a heart attack"   Flexeril [Cyclobenzaprine Hcl] Other (See Comments)    "whole body  Tremors" (05/27/2012)   Kiwi Extract Other (See Comments)    "feels like I'm having a heart attack"    Family History  Problem Relation Age of Onset   Colon cancer Neg Hx     Prior to Admission medications   Medication Sig Start Date End Date Taking? Authorizing Provider  acetaminophen (TYLENOL) 325 MG tablet Take 2 tablets (650 mg total) by mouth every 6 (six) hours as needed for mild pain, fever or headache (or Fever >/= 101). Patient taking differently: Take 650 mg by mouth every 12 (twelve) hours as needed for mild pain. 11/18/19  Yes Emokpae, Courage,  MD  ALPRAZolam Duanne Moron) 0.5 MG tablet Take 0.5 mg by mouth at bedtime.   Yes [provider]  Amino Acids-Protein Hydrolys (PRO-STAT AWC) LIQD Take 30 mLs by mouth daily.   Yes [provider]  amLODipine (NORVASC) 10 MG tablet Take 1 tablet (10 mg total) by mouth daily. 05/21/19  Yes Emokpae, Courage, MD  ascorbic acid (VITAMIN C) 500 MG tablet Take 500 mg by mouth 2 (two) times daily.   Yes [provider]  aspirin EC 81 MG tablet Take 1 tablet (81 mg total) by mouth daily with breakfast. 11/18/19  Yes Emokpae, Courage, MD  BREO ELLIPTA 100-25 MCG/INH AEPB Inhale 1 puff into the lungs daily.  09/27/16  Yes [provider]  calcitRIOL (ROCALTROL) 0.25 MCG capsule Take 0.25 mcg by mouth daily.   Yes [provider]  calcium carbonate (TUMS - DOSED IN MG ELEMENTAL CALCIUM) 500 MG chewable tablet Chew 2 tablets by mouth 2 (two) times  daily.   Yes [provider]  cefTRIAXone (ROCEPHIN) 1 g injection Inject 1 g into the muscle daily.   Yes [provider]  cholecalciferol (VITAMIN D) 1000 units tablet Take 1,000 Units by mouth daily.   Yes [provider]  coconut oil OIL Apply 1 Application topically every 6 (six) hours as needed (protection and prevention).   Yes [provider]  colchicine 0.6 MG tablet Take 0.6 mg by mouth daily.   Yes [provider]  cyanocobalamin 1000 MCG tablet Take 1,000 mcg by mouth daily.   Yes [provider]  ferrous sulfate 325 (65 FE) MG tablet Take 325 mg by mouth 2 (two) times daily.   Yes [provider]  FLUoxetine (PROZAC) 40 MG capsule Take 40 mg by mouth daily.   Yes [provider]  gabapentin (NEURONTIN) 300 MG capsule Take 1 capsule (300 mg total) by mouth at bedtime. 12/21/19  Yes Barton Dubois, MD  guaiFENesin 200 MG tablet Take 200 mg by mouth 2 (two) times daily.   Yes [provider]  HYDROcodone-acetaminophen (NORCO) 7.5-325 MG  tablet Take 1 tablet by mouth 4 (four) times daily.   Yes [provider]  insulin aspart (NOVOLOG) 100 UNIT/ML injection Inject 2-10 Units into the skin See admin instructions. Sliding scale if 200-250 = 2 units, 251-300 = 4 units, 301-350 = 6 units, 351-400 = 8 units, 401-500 units = 10 units before meals and at betime   Yes [provider]  ipratropium-albuterol (DUONEB) 0.5-2.5 (3) MG/3ML SOLN Take 3 mLs by nebulization every 4 (four) hours as needed (shortness of breath / wheezing).   Yes [provider]  LANTUS 100 UNIT/ML injection Inject 32 Units into the skin at bedtime.  03/26/20  Yes [provider]  linezolid (ZYVOX) 600 MG tablet Take 600 mg by mouth 2 (two) times daily.   Yes [provider]  magnesium oxide (MAG-OX) 400 MG tablet Take 400 mg by mouth daily.   Yes [provider]  metoprolol tartrate (LOPRESSOR) 50 MG tablet Take 50 mg by mouth 2 (two) times daily. 04/27/20  Yes [provider]  Nutritional Supplement LIQD Take 120 mLs by mouth daily. "House supplement"   Yes [provider]  pantoprazole (PROTONIX) 40 MG tablet Take 1 tablet (40 mg total) by mouth daily before breakfast. 05/21/19 03/27/22 Yes Emokpae, Courage, MD  polyethylene glycol (MIRALAX / GLYCOLAX) 17 g packet Take 17 g by mouth daily. For constipation   Yes [provider]  potassium bicarbonate (K-LYTE) 25 MEQ disintegrating tablet Take 25 mEq by mouth daily.   Yes [provider]  senna (SENOKOT) 8.6 MG tablet Take 2 tablets by mouth 2 (two) times daily.   Yes [provider]  tamsulosin (FLOMAX) 0.4 MG CAPS capsule Take 1 capsule (0.4 mg total) by mouth daily after supper. Patient taking differently: Take 0.4 mg by mouth daily. 11/18/19  Yes Emokpae, Courage, MD  torsemide (DEMADEX) 20 MG tablet Take 2 tablets (40 mg total) by mouth 2 (two) times daily. Patient taking differently: Take 80 mg by mouth 2 (two) times  daily. 12/21/19  Yes Barton Dubois, MD    Physical Exam: Vitals:   04/02/22 1015 04/02/22 1100 04/02/22 1513 04/02/22 1600  BP:  (!) 126/34  133/65  Pulse: 82 78 68 (!) 58  Resp: '18 16 17 16  '$ Temp:  97.9 F (36.6 C)  99.6 F (37.6 C)  TempSrc:  Axillary  Axillary  SpO2:  99% 98% 95% 98%  Weight:      Height:       General:  Appears chronically ill and is in NAD Eyes:  EOMI, normal lids, iris ENT:  grossly normal hearing, lips & tongue, mmm Neck:  no LAD, masses or thyromegaly Cardiovascular:  RRR, no m/r/g. No LE edema.  Respiratory:   CTA bilaterally with no wheezes/rales/rhonchi.  Normal respiratory effort. Abdomen:  soft, NT, ND Skin:  no rash or induration seen on limited exam Musculoskeletal:  LLE wound vac in place with lower leg wrapped; R foot without obvious ulcers Psychiatric:  blunted mood and affect, speech fluent and appropriate, AOx3 Neurologic:  CN 2-12 grossly intact, moves all extremities in coordinated fashion   Radiological Exams on Admission: Independently reviewed - see discussion in A/P where applicable  DG CHEST PORT 1 VIEW  Result Date: 04/01/2022 CLINICAL DATA:  Shortness of breath EXAM: PORTABLE CHEST 1 VIEW COMPARISON:  Chest radiograph dated 05/11/2020 FINDINGS: The heart is enlarged. Vascular calcifications are seen in the aortic arch. Moderate bilateral lower lung predominant interstitial opacities are noted. There is no pleural effusion or pneumothorax. Degenerative changes are seen in the spine. IMPRESSION: Moderate bilateral lower lung predominant interstitial opacities, which may represent pulmonary edema or atypical infection. Electronically Signed   By: Zerita Boers M.D.   On: 04/01/2022 19:15    EKG: none since 10/25   Labs on Admission: I have personally reviewed the available labs and imaging studies at the time of the admission.  Pertinent labs:    Glucose 194 BUN 57/Creatinine 3.4/GFR 19 - stable since admission WBC 9.5 Hgb 8.5;  10.7 on 10/25 A1c 5.8   Family Communication: None present; he declined to have me call family at the time of the consult Primary team communication: I spoke directly with Dr. Sharol Given at the time of the consult request.  Thank you very much for involving [place] in the care of your patient.  Author: Karmen Bongo, MD 04/02/2022 4:18 PM  For on call review [email or web address].

## 2022-04-02 NOTE — Assessment & Plan Note (Signed)
-  Continue home amlodipine, metoprolol

## 2022-04-02 NOTE — Progress Notes (Signed)
Paged MD Yates at 1600 due to patient saying they feel like they are floating on the clouds and unable to tell me the year. I just taken patient off Bipap and he was satting 98 on HFNC. Per Lorin Mercy he doesn't need to be that oxygenated, his pulse ox needs to be between 88-92. Patient no longer feels like he is floating and knows the year now. Will continue to monitor.

## 2022-04-02 NOTE — Assessment & Plan Note (Deleted)
-  Patient with COPD, on 3L home O2 -He has an Production designer, theatre/television/film

## 2022-04-02 NOTE — Progress Notes (Addendum)
[facility] Infectious Disease  Date of Admission:  03/28/2022      Total days of antibiotics:      ASSESSMENT: RANON COVEN is a 71 y.o. 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 This was removed surgically on 03/28/22 with Dr. Sharol Given along with bone debridement. Intraoperative cultures grew out Acinetobacter, Morganella, VRE and MRSA. Susceptibility without any great option for oral treatment - will need to proceed with tunneled central line (CKD < 40 CrCl) and continue renally adjusted IV daptomycin and meropenem.   Respiratory event noted overnight - fairly sudden event yesterday evening after dinner with SOB not improved despite 2 breathing treatments. Placed on bipap and given a dose of lastix and he says he had good improvement in work of breathing. No fevers or changes in WBC counts. CXR reviewed - moderate bilateral interstiaal opacities. He is on broad coverage if PNA is a concern; feels more pulmonary edema with good response to lasix/bipap intervention.  Will plan for 3 week follow up in ID clinic to see how he is doing on abx. OPAT outlined below.   ID will sign off - please call back with any questions/concerns or if we can be of further assistance.     PLAN: IR consult for tunneled line for IV abx to continue  OPAT as outlined below  OPAT ORDERS:  Diagnosis: OM of fibula d/t infected hardware    Culture Result: Acinetobacter, Morganella (both FQ resistant), VRE and MRSA  Allergies  Allergen Reactions   Baclofen     unknown   Blueberry Flavor Other (See Comments)    Unknown   Cucumber Extract Other (See Comments)    "Fells like I'm having a heart attack"   Flexeril [Cyclobenzaprine Hcl] Other (See Comments)    "whole body  Tremors" (05/27/2012)   Kiwi Extract Other (See Comments)    "feels like I'm having a heart attack"     Discharge antibiotics to be given via PICC line:  Per pharmacy  protocol   DAPTOMYCIN + MEROPENEM (renally adjusted)   Duration: 6 weeks   End Date: Dec 6th    [place] Per Protocol with [facility] Use: Home health RN for IV administration and teaching, line care and labs.    Labs weekly while on IV antibiotics: _x_ CBC with differential __ BMP **TWICE WEEKLY ON VANCOMYCIN  _x_ CMP _x_ CRP _x_ ESR __ Vancomycin trough TWICE WEEKLY _x_ CK  __ Please pull PIC at completion of IV antibiotics _x_ Please leave PIC in place until doctor has seen patient or been notified  Fax weekly labs to [phone]  Clinic Follow Up Appt: 11/21 @ 10/45 with Dr. Juleen China    Principal Problem:   Leg wound, left, sequela Active Problems:   Ulcer of left lower leg, with necrosis of bone ([name])   Abscess of left lower leg   Hardware complicating wound infection ([place])   Osteomyelitis of ankle or foot, acute, left ([name])   Stage 4 chronic kidney disease ([name])    ALPRAZolam  0.5 mg Oral QHS   amLODipine  10 mg Oral Daily   aspirin EC  81 mg Oral Q breakfast   calcitRIOL  0.25 mcg Oral Daily   colchicine  0.6 mg Oral Daily   docusate sodium  100 mg Oral BID   FLUoxetine  40 mg Oral Daily   fluticasone furoate-vilanterol  1 puff Inhalation Daily  gabapentin  300 mg Oral QHS   insulin aspart  0-15 Units Subcutaneous TID WC   insulin aspart  4 Units Subcutaneous TID WC   insulin glargine-yfgn  20 Units Subcutaneous QHS   metoprolol tartrate  50 mg Oral BID   tamsulosin  0.4 mg Oral QPC supper    SUBJECTIVE: Doing OK today - moved to higher level of care for breathing trouble. Feels much better with bipap mask on.  No new concerns or complaints.    Review of Systems: Review of Systems  Constitutional:  Negative for chills, fever, malaise/fatigue and weight loss.  HENT:  Negative for sore throat.        No dental problems  Respiratory:  Positive for shortness of breath and wheezing. Negative for cough and sputum production.   Cardiovascular:   Negative for chest pain and leg swelling.  Gastrointestinal:  Negative for abdominal pain, diarrhea and vomiting.  Genitourinary:  Negative for dysuria and flank pain.  Musculoskeletal:  Negative for joint pain, myalgias and neck pain.  Skin:  Negative for rash.  Neurological:  Negative for dizziness, tingling and headaches.  Psychiatric/Behavioral:  Negative for depression and substance abuse. The patient is not nervous/anxious and does not have insomnia.     Allergies  Allergen Reactions   Baclofen     unknown   Blueberry Flavor Other (See Comments)    Unknown   Cucumber Extract Other (See Comments)    "Fells like I'm having a heart attack"   Flexeril [Cyclobenzaprine Hcl] Other (See Comments)    "whole body  Tremors" (05/27/2012)   Kiwi Extract Other (See Comments)    "feels like I'm having a heart attack"    OBJECTIVE: Vitals:   04/02/22 0757 04/02/22 0826 04/02/22 1015 04/02/22 1100  BP:  (!) 151/105  (!) 126/34  Pulse:  76 82 78  Resp:   18 16  Temp:    97.9 F (36.6 C)  TempSrc:    Axillary  SpO2: 97%  99% 98%  Weight:      Height:       Body mass index is 34.58 kg/m.  Physical Exam Vitals reviewed.  Constitutional:      Appearance: He is well-developed.  HENT:     Mouth/Throat:     Dentition: Normal dentition. No dental abscesses.  Cardiovascular:     Rate and Rhythm: Normal rate and regular rhythm.     Heart sounds: Normal heart sounds.  Pulmonary:     Effort: Pulmonary effort is normal.     Breath sounds: Normal breath sounds.     Comments: Bipap full face mask on. Conversing easily. Breathing comfortably w/o any accessory use Abdominal:     General: There is no distension.     Palpations: Abdomen is soft.     Tenderness: There is no abdominal tenderness.  Musculoskeletal:     Comments: Left leg wrapped in ace wrap with wound vac in place. Some dark red/thin fluid in tubing.   Lymphadenopathy:     Cervical: No cervical adenopathy.  Skin:     General: Skin is warm and dry.     Findings: No rash.  Neurological:     Mental Status: He is alert and oriented to person, place, and time.  Psychiatric:        Judgment: Judgment normal.     Lab Results Lab Results  Component Value Date   WBC 9.5 04/02/2022   HGB 8.5 (L) 04/02/2022   HCT 26.1 (L) 04/02/2022  MCV 95.3 04/02/2022   PLT 198 04/02/2022    Lab Results  Component Value Date   CREATININE 3.40 (H) 04/02/2022   BUN 57 (H) 04/02/2022   NA 143 04/02/2022   K 3.7 04/02/2022   CL 110 04/02/2022   CO2 22 04/02/2022    Lab Results  Component Value Date   ALT 88 (H) 05/14/2020   AST 102 (H) 05/14/2020   ALKPHOS 139 (H) 05/14/2020   BILITOT 1.6 (H) 05/14/2020     Microbiology: Recent Results (from the past 240 hour(s))  Aerobic/Anaerobic Culture w Gram Stain (surgical/deep wound)     Status: None   Collection Time: 03/28/22  9:30 AM   Specimen: Wound; Tissue  Result Value Ref Range Status   Specimen Description WOUND  Final   Special Requests ANKLE  Final   Gram Stain NO WBC SEEN NO ORGANISMS SEEN   Final   Culture   Final    RARE ACINETOBACTER CALCOACETICUS/BAUMANNII COMPLEX RARE MORGANELLA MORGANII RARE ENTEROCOCCUS FAECALIS RARE METHICILLIN RESISTANT STAPHYLOCOCCUS AUREUS VANCOMYCIN RESISTANT ENTEROCOCCUS ISOLATED NO ANAEROBES ISOLATED Performed at [facility], [address], [place], [place] [address]    Report Status 04/02/2022 FINAL  Final   Organism ID, Bacteria ACINETOBACTER CALCOACETICUS/BAUMANNII COMPLEX  Final   Organism ID, Bacteria MORGANELLA MORGANII  Final   Organism ID, Bacteria ENTEROCOCCUS FAECALIS  Final   Organism ID, Bacteria METHICILLIN RESISTANT STAPHYLOCOCCUS AUREUS  Final      Susceptibility   Acinetobacter calcoaceticus/baumannii complex - MIC*    CEFTAZIDIME >=64 RESISTANT Resistant     CIPROFLOXACIN >=4 RESISTANT Resistant     GENTAMICIN <=1 SENSITIVE Sensitive     IMIPENEM 0.5 SENSITIVE Sensitive     PIP/TAZO  >=128 RESISTANT Resistant     TRIMETH/SULFA <=20 SENSITIVE Sensitive     AMPICILLIN/SULBACTAM <=2 SENSITIVE Sensitive     * RARE ACINETOBACTER CALCOACETICUS/BAUMANNII COMPLEX   Enterococcus faecalis - MIC*    AMPICILLIN <=2 SENSITIVE Sensitive     VANCOMYCIN >=32 RESISTANT Resistant     GENTAMICIN SYNERGY SENSITIVE Sensitive     LINEZOLID 2 SENSITIVE Sensitive     * RARE ENTEROCOCCUS FAECALIS   Morganella morganii - MIC*    AMPICILLIN >=32 RESISTANT Resistant     CEFAZOLIN >=64 RESISTANT Resistant     CEFTAZIDIME 32 RESISTANT Resistant     CIPROFLOXACIN 2 RESISTANT Resistant     GENTAMICIN <=1 SENSITIVE Sensitive     IMIPENEM 4 SENSITIVE Sensitive     TRIMETH/SULFA >=320 RESISTANT Resistant     AMPICILLIN/SULBACTAM >=32 RESISTANT Resistant     PIP/TAZO <=4 SENSITIVE Sensitive     * RARE MORGANELLA MORGANII   Methicillin resistant staphylococcus aureus - MIC*    CIPROFLOXACIN >=8 RESISTANT Resistant     ERYTHROMYCIN >=8 RESISTANT Resistant     GENTAMICIN <=0.5 SENSITIVE Sensitive     OXACILLIN >=4 RESISTANT Resistant     TETRACYCLINE <=1 SENSITIVE Sensitive     VANCOMYCIN 1 SENSITIVE Sensitive     TRIMETH/SULFA <=10 SENSITIVE Sensitive     CLINDAMYCIN <=0.25 SENSITIVE Sensitive     RIFAMPIN <=0.5 SENSITIVE Sensitive     Inducible Clindamycin NEGATIVE Sensitive     * RARE METHICILLIN RESISTANT STAPHYLOCOCCUS AUREUS    Janene Madeira, MSN, NP-C [facility] Pager: [phone]  _0 @ 12:15 PM

## 2022-04-02 NOTE — Care Management Important Message (Signed)
Important Message  Patient Details  Name: Eric Scott MRN: [id number] Date of Birth: Mar 19, 1951   Medicare Important Message Given:  Yes     Cassidy Tabet 04/02/2022, 3:12 PM

## 2022-04-02 NOTE — Assessment & Plan Note (Addendum)
-  Appears to be stable at this time -Attempt to avoid nephrotoxic medications -Recheck BMP periodically -Continue calcitriol -Continue colchicine for gout

## 2022-04-03 ENCOUNTER — Inpatient Hospital Stay (HOSPITAL_COMMUNITY): Payer: Medicare (Managed Care)

## 2022-04-03 ENCOUNTER — Telehealth (HOSPITAL_COMMUNITY): Payer: Self-pay | Admitting: *Deleted

## 2022-04-03 DIAGNOSIS — S81802S Unspecified open wound, left lower leg, sequela: Secondary | ICD-10-CM | POA: Diagnosis not present

## 2022-04-03 HISTORY — PX: IR FLUORO GUIDE CV LINE RIGHT: IMG2283

## 2022-04-03 HISTORY — PX: IR US GUIDE VASC ACCESS RIGHT: IMG2390

## 2022-04-03 LAB — GLUCOSE, CAPILLARY
Glucose-Capillary: 107 mg/dL — ABNORMAL HIGH (ref 70–99)
Glucose-Capillary: 100 mg/dL — ABNORMAL HIGH (ref 70–99)
Glucose-Capillary: 106 mg/dL — ABNORMAL HIGH (ref 70–99)
Glucose-Capillary: 146 mg/dL — ABNORMAL HIGH (ref 70–99)
Glucose-Capillary: 117 mg/dL — ABNORMAL HIGH (ref 70–99)

## 2022-04-03 MED ORDER — TORSEMIDE 20 MG PO TABS
80.0000 mg | ORAL_TABLET | Freq: Two times a day (BID) | ORAL | Status: DC
  Administered 2022-04-03 – 2022-04-05 (×4): 80 mg via ORAL
  Filled 2022-04-03 (×4): qty 4

## 2022-04-03 MED ORDER — CHLORHEXIDINE GLUCONATE CLOTH 2 % EX PADS
6.0000 | MEDICATED_PAD | Freq: Every day | CUTANEOUS | Status: DC
  Administered 2022-04-03 – 2022-04-11 (×9): 6 via TOPICAL

## 2022-04-03 MED ORDER — LIDOCAINE-EPINEPHRINE 1 %-1:100000 IJ SOLN
INTRAMUSCULAR | Status: AC
  Filled 2022-04-03: qty 1

## 2022-04-03 MED ORDER — LIDOCAINE HCL 1 % IJ SOLN
INTRAMUSCULAR | Status: AC
  Administered 2022-04-03: 6 mL
  Filled 2022-04-03: qty 20

## 2022-04-03 NOTE — Progress Notes (Signed)
Patient ID: Eric Scott, male   DOB: [birth year], 71 y.o.   MRN: [id number] Patient is comfortable this morning on BiPAP without complaints.  O2 sats 96%.  Anticipate discharge with IV antibiotics through a central line when stable from respiratory standpoint.

## 2022-04-03 NOTE — Procedures (Signed)
Pre procedural Diagnosis: Poor venous access Post Procedural Diagnosis: Same  Successful placement of right IJ approach tunneled CVC with tip at the superior caval-atrial junction.    EBL: Trace No immediate post procedural complication.  The CVC is ready for immediate use.  Ronny Bacon, MD Pager #: [phone]

## 2022-04-03 NOTE — Progress Notes (Signed)
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 Sharol Given after referral from PCP and had exposed hardware He was admitted by Dr. Due to 10/25 underwent left ankle hardware removal with partial distal fibular excision application of 95 cm Kerecis micro powder and local tissue arrangement 15 X3  ID saw the patient for antibiotic recommendations  Postoperatively he was planned for discharge but then developed shortness of breath on 10/29 and hospitalist service was contacted He was placed on BiPAP sent to stepdown unit   Hospital-Problem based course  Hypoxic respiratory failure Superimposed on known COPD 3 L at home Already on antibiotics seems he developed an HCAP--we will repeat x-ray in a.m. 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 Continues on sliding scale coverage 4 units 3 times daily meals and lower dose of 20 units at bedtime [place]-acting  Depression  continue Xanax 0.5 Prozac 40  HFpEF, HTN no acute component Continue metoprolol 50 twice daily, amlodipine 10 adjust as needed, aspirin 81  AKI superimposed on CKD 5 on admit Received fluids-now starting back Demadex may have to readjust Monitor trends-renally dose meds Continue Flomax for BPH   DVT prophylaxis: SCD Code Status: Full Family Communication: None Disposition:  Status is: Inpatient Remains inpatient appropriate because:   Needs more stable respiratory status and to be liberated from high flow oxygen   Consultants:  We took over from Ortho  Procedures: Surgery 10/25  Antimicrobials: As above   Subjective: He is on high flow cannula about 5 -6 L--tells me usually takes diuretics at home Has no chest pain He is a little slow to answer  Objective: Vitals:   04/03/22 0820 04/03/22 1025 04/03/22 1055 04/03/22 1543  BP:   (!) 145/121 (!) 132/49  Pulse:   63 (!) 57  Resp:   14 (!) 25  Temp:   98.7 F (37.1 C) 98 F (36.7 C)  TempSrc:  Oral Oral Oral  SpO2: 98%  90% 95%  Weight:      Height:        Intake/Output Summary (Last 24 hours) at 04/03/2022 1614 Last data filed at 04/03/2022 1200 Gross per 24 hour  Intake 620 ml  Output 325 ml  Net 295 ml   Filed Weights   03/28/22 0641  Weight: 115.7 kg    Examination:  EOMI NCAT thick neck Mallampati 4 no distress EOMI NCAT Chest is bilaterally clear but diminished posterior laterally ROM is intact  although a little slow I do not examine his wound Abdomen is obese nontender no rebound S1-S2 with some PVCs on monitors  Data Reviewed: personally reviewed   CBC    Component Value Date/Time   WBC 9.5 04/02/2022 0018   RBC 2.74 (L) 04/02/2022 0018   HGB 8.5 (L) 04/02/2022 0018   HCT 26.1 (L) 04/02/2022 0018   PLT 198 04/02/2022 0018   MCV 95.3 04/02/2022 0018   MCH 31.0 04/02/2022 0018   MCHC 32.6 04/02/2022 0018   RDW 14.9 04/02/2022 0018   LYMPHSABS 1.2 01/31/2021 1311   MONOABS 0.7  01/31/2021 1311   EOSABS 0.7 (H) 01/31/2021 1311   BASOSABS 0.1 01/31/2021 1311      Latest Ref Rng & Units 04/02/2022   12:18 AM 03/31/2022    5:45 AM 03/28/2022    8:03 AM  CMP  Glucose 70 - 99 mg/dL 194  101  124   BUN 8 - 23 mg/dL 57  57  61   Creatinine 0.61 - 1.24 mg/dL 3.40  3.61  3.78   Sodium 135 - 145 mmol/L 143  145  141   Potassium 3.5 - 5.1 mmol/L 3.7  3.7  4.1   Chloride 98 - 111 mmol/L 110  113  105   CO2 22 - 32 mmol/L _0 Calcium 8.9 - 10.3 mg/dL 8.6  8.7  8.5      Radiology Studies: IR Fluoro Guide CV Line Right  Result Date: 04/03/2022 INDICATION: Poor venous access. Chronic renal insufficiency. In need of durable intravenous access for antibiotic administration. EXAM: ULTRASOUND AND FLUOROSCOPIC GUIDED PLACEMENT OF TUNNELED CENTRAL VENOUS CATHETER MEDICATIONS: None ANESTHESIA/SEDATION: None FLUOROSCOPY TIME:  18 seconds (8 mGy) COMPLICATIONS: None immediate. PROCEDURE: Informed written consent was obtained from the patient after a discussion of the risks, benefits, and alternatives to treatment. Questions regarding the procedure were encouraged and answered. The right neck and chest were prepped with chlorhexidine in a sterile fashion, and a sterile drape was applied covering the operative field. Maximum barrier sterile technique with sterile gowns and gloves were used for the procedure. A timeout was performed prior to the initiation of the procedure. After the overlying soft tissues were anesthetized, a small venotomy incision was created and a micropuncture kit was utilized to access the internal jugular vein. Real-time ultrasound guidance was utilized for vascular access including the acquisition of a permanent ultrasound image documenting patency of the accessed vessel. The microwire was utilized to measure appropriate catheter length. The micropuncture sheath was exchanged for a peel-away sheath over a guidewire. A 5 [place] dual lumen tunneled central  venous catheter measuring 23 cm was tunneled in a retrograde fashion from the anterior chest wall to the venotomy incision. The catheter was then placed through the peel-away sheath with tip ultimately positioned at the superior caval-atrial junction. Final catheter positioning was confirmed and documented with a spot radiographic image. The catheter aspirates and flushes normally. The catheter was flushed with appropriate volume heparin dwells. The catheter exit site was secured with a 0-Prolene retention suture. The venotomy incision was closed with Dermabond and Steri-strips. Dressings were applied. The patient tolerated the procedure well without immediate post procedural complication. FINDINGS: After catheter placement, the tip lies within the superior cavoatrial junction. The catheter aspirates and flushes normally and is ready for immediate use. IMPRESSION: Successful placement of 23 cm dual lumen tunneled central venous catheter via the right internal jugular vein with tip terminating at the superior  caval atrial junction. The catheter is ready for immediate use. Electronically Signed   By: Sandi Mariscal M.D.   On: 04/03/2022 10:05   IR US Guide Vasc Access Right  Result Date: 04/03/2022 INDICATION: Poor venous access. Chronic renal insufficiency. In need of durable intravenous access for antibiotic administration. EXAM: ULTRASOUND AND FLUOROSCOPIC GUIDED PLACEMENT OF TUNNELED CENTRAL VENOUS CATHETER MEDICATIONS: None ANESTHESIA/SEDATION: None FLUOROSCOPY TIME:  18 seconds (8 mGy) COMPLICATIONS: None immediate. PROCEDURE: Informed written consent was obtained from the patient after a discussion of the risks, benefits, and alternatives to treatment. Questions regarding the procedure were encouraged and answered. The right neck and chest were prepped with chlorhexidine in a sterile fashion, and a sterile drape was applied covering the operative field. Maximum barrier sterile technique with sterile gowns and  gloves were used for the procedure. A timeout was performed prior to the initiation of the procedure. After the overlying soft tissues were anesthetized, a small venotomy incision was created and a micropuncture kit was utilized to access the internal jugular vein. Real-time ultrasound guidance was utilized for vascular access including the acquisition of a permanent ultrasound image documenting patency of the accessed vessel. The microwire was utilized to measure appropriate catheter length. The micropuncture sheath was exchanged for a peel-away sheath over a guidewire. A 5 [place] dual lumen tunneled central venous catheter measuring 23 cm was tunneled in a retrograde fashion from the anterior chest wall to the venotomy incision. The catheter was then placed through the peel-away sheath with tip ultimately positioned at the superior caval-atrial junction. Final catheter positioning was confirmed and documented with a spot radiographic image. The catheter aspirates and flushes normally. The catheter was flushed with appropriate volume heparin dwells. The catheter exit site was secured with a 0-Prolene retention suture. The venotomy incision was closed with Dermabond and Steri-strips. Dressings were applied. The patient tolerated the procedure well without immediate post procedural complication. FINDINGS: After catheter placement, the tip lies within the superior cavoatrial junction. The catheter aspirates and flushes normally and is ready for immediate use. IMPRESSION: Successful placement of 23 cm dual lumen tunneled central venous catheter via the right internal jugular vein with tip terminating at the superior caval atrial junction. The catheter is ready for immediate use. Electronically Signed   By: Sandi Mariscal M.D.   On: 04/03/2022 10:05   DG CHEST PORT 1 VIEW  Result Date: 04/01/2022 CLINICAL DATA:  Shortness of breath EXAM: PORTABLE CHEST 1 VIEW COMPARISON:  Chest radiograph dated 05/11/2020 FINDINGS:  The heart is enlarged. Vascular calcifications are seen in the aortic arch. Moderate bilateral lower lung predominant interstitial opacities are noted. There is no pleural effusion or pneumothorax. Degenerative changes are seen in the spine. IMPRESSION: Moderate bilateral lower lung predominant interstitial opacities, which may represent pulmonary edema or atypical infection. Electronically Signed   By: Zerita Boers M.D.   On: 04/01/2022 19:15     Scheduled Meds:  ALPRAZolam  0.5 mg Oral QHS   amLODipine  10 mg Oral Daily   aspirin EC  81 mg Oral Q breakfast   calcitRIOL  0.25 mcg Oral Daily   Chlorhexidine Gluconate Cloth  6 each Topical Daily   colchicine  0.6 mg Oral Daily   docusate sodium  100 mg Oral BID   FLUoxetine  40 mg Oral Daily   fluticasone furoate-vilanterol  1 puff Inhalation Daily   gabapentin  300 mg Oral QHS   insulin aspart  0-15 Units Subcutaneous TID WC   insulin aspart  4 Units Subcutaneous TID WC   insulin glargine-yfgn  20 Units Subcutaneous QHS   ipratropium-albuterol  3 mL Nebulization Q6H   lidocaine-EPINEPHrine       metoprolol tartrate  50 mg Oral BID   tamsulosin  0.4 mg Oral QPC supper   torsemide  80 mg Oral BID   Continuous Infusions:  sodium chloride Stopped (04/01/22 0827)   DAPTOmycin (CUBICIN) 750 mg in sodium chloride 0.9 % IVPB Stopped (04/01/22 1840)   meropenem (MERREM) IV 500 mg (04/03/22 0459)   methocarbamol (ROBAXIN) IV       LOS: 5 days   Time spent: [place], MD [facility] To contact the attending provider between 7A-7P or the covering provider during after hours 7P-7A, please log into the web site [email or web address] and access using universal [place] password for that web site. If you do not have the password, please call the hospital operator.  04/03/2022, 4:14 PM

## 2022-04-03 NOTE — TOC Progression Note (Signed)
Transition of Care [facility]) - Progression Note    Patient Details  Name: Eric Scott MRN: [id number] Date of Birth: [birth year]  [facility] CM/SW [place], [place] Phone Number: 04/03/2022, 1:57 PM  Clinical Narrative:     TOC continuing to follow to assist with DC needs.   Expected Discharge Plan: [place] Barriers to Discharge: Continued Medical Work up  Expected Discharge Plan and Services Expected Discharge Plan: [place] In-house Referral: Clinical Social Work   Post Acute Care Choice: Resumption of Svcs/PTA Provider Living arrangements for the past 2 months: [place]                                       Social Determinants of Health (SDOH) Interventions    Readmission Risk Interventions     No data to display

## 2022-04-03 NOTE — Plan of Care (Signed)
  Problem: Education: Goal: Knowledge of General Education information will improve Description: Including pain rating scale, medication(s)/side effects and non-pharmacologic comfort measures Outcome: Progressing   Problem: Health Behavior/Discharge Planning: Goal: Ability to manage health-related needs will improve Outcome: Progressing   Problem: Clinical Measurements: Goal: Respiratory complications will improve Outcome: Progressing   Problem: Nutrition: Goal: Adequate nutrition will be maintained Outcome: Progressing   Problem: Pain Managment: Goal: General experience of comfort will improve Outcome: Progressing

## 2022-04-04 DIAGNOSIS — S81802S Unspecified open wound, left lower leg, sequela: Secondary | ICD-10-CM | POA: Diagnosis not present

## 2022-04-04 LAB — CBC
HCT: 25.8 % — ABNORMAL LOW (ref 39.0–52.0)
Hemoglobin: 8.1 g/dL — ABNORMAL LOW (ref 13.0–17.0)
MCH: 30 pg (ref 26.0–34.0)
MCHC: 31.4 g/dL (ref 30.0–36.0)
MCV: 95.6 fL (ref 80.0–100.0)
Platelets: 210 10*3/uL (ref 150–400)
RBC: 2.7 MIL/uL — ABNORMAL LOW (ref 4.22–5.81)
RDW: 14.6 % (ref 11.5–15.5)
WBC: 8.3 10*3/uL (ref 4.0–10.5)
nRBC: 0 % (ref 0.0–0.2)

## 2022-04-04 LAB — BASIC METABOLIC PANEL
Anion gap: 12 (ref 5–15)
BUN: 64 mg/dL — ABNORMAL HIGH (ref 8–23)
CO2: 21 mmol/L — ABNORMAL LOW (ref 22–32)
Calcium: 8.5 mg/dL — ABNORMAL LOW (ref 8.9–10.3)
Chloride: 107 mmol/L (ref 98–111)
Creatinine, Ser: 3.29 mg/dL — ABNORMAL HIGH (ref 0.61–1.24)
GFR, Estimated: 19 mL/min — ABNORMAL LOW (ref >60)
Glucose, Bld: 124 mg/dL — ABNORMAL HIGH (ref 70–99)
Potassium: 3.8 mmol/L (ref 3.5–5.1)
Sodium: 140 mmol/L (ref 135–145)

## 2022-04-04 LAB — GLUCOSE, CAPILLARY
Glucose-Capillary: 120 mg/dL — ABNORMAL HIGH (ref 70–99)
Glucose-Capillary: 132 mg/dL — ABNORMAL HIGH (ref 70–99)
Glucose-Capillary: 154 mg/dL — ABNORMAL HIGH (ref 70–99)
Glucose-Capillary: 97 mg/dL (ref 70–99)

## 2022-04-04 LAB — BRAIN NATRIURETIC PEPTIDE: B Natriuretic Peptide: 985.2 pg/mL — ABNORMAL HIGH (ref 0.0–100.0)

## 2022-04-04 MED ORDER — INSULIN GLARGINE-YFGN 100 UNIT/ML ~~LOC~~ SOLN
10.0000 [IU] | Freq: Every day | SUBCUTANEOUS | Status: DC
  Administered 2022-04-05 – 2022-04-08 (×4): 10 [IU] via SUBCUTANEOUS
  Filled 2022-04-04 (×5): qty 0.1

## 2022-04-04 MED ORDER — POTASSIUM CHLORIDE CRYS ER 20 MEQ PO TBCR
40.0000 meq | EXTENDED_RELEASE_TABLET | Freq: Once | ORAL | Status: AC
  Administered 2022-04-04: 40 meq via ORAL
  Filled 2022-04-04: qty 2

## 2022-04-04 MED ORDER — HEPARIN SODIUM (PORCINE) 5000 UNIT/ML IJ SOLN
5000.0000 [IU] | Freq: Three times a day (TID) | INTRAMUSCULAR | Status: DC
  Administered 2022-04-04 – 2022-04-10 (×18): 5000 [IU] via SUBCUTANEOUS
  Filled 2022-04-04 (×18): qty 1

## 2022-04-04 NOTE — Progress Notes (Signed)
Placed on HFNC 7l to eat but desats to 80's. Bipap applied with pulse ox- 94%.  Continue to monitor.

## 2022-04-04 NOTE — TOC Progression Note (Signed)
Transition of Care [facility]) - Progression Note    Patient Details  Name: Eric Scott MRN: [id number] Date of Birth: 04/13/1951  Transition of Care [facility]) CM/SW Contact  Zenon Mayo, RN Phone Number: 04/04/2022, 1:36 PM  Clinical Narrative:    NCM left vm for Irven Shelling , administrator at [facility] to cal this NCM back concerning the Daptomycin and Meropenem IV ABX, and wound vac, patient will need.   Expected Discharge Plan: [place] Barriers to Discharge: Continued Medical Work up  Expected Discharge Plan and Services Expected Discharge Plan: [place] In-house Referral: Clinical Social Work   Post Acute Care Choice: Resumption of Svcs/PTA Provider Living arrangements for the past 2 months: [place]                                       Social Determinants of Health (SDOH) Interventions    Readmission Risk Interventions     No data to display

## 2022-04-04 NOTE — Plan of Care (Signed)
  Problem: Education: Goal: Ability to describe self-care measures that may prevent or decrease complications (Diabetes Survival Skills Education) will improve Outcome: Progressing Goal: Individualized Educational Video(s) Outcome: Progressing   Problem: Coping: Goal: Ability to adjust to condition or change in health will improve Outcome: Progressing   Problem: Fluid Volume: Goal: Ability to maintain a balanced intake and output will improve Outcome: Progressing   Problem: Fluid Volume: Goal: Ability to maintain a balanced intake and output will improve Outcome: Progressing   Problem: Health Behavior/Discharge Planning: Goal: Ability to identify and utilize available resources and services will improve Outcome: Progressing Goal: Ability to manage health-related needs will improve Outcome: Progressing   Problem: Metabolic: Goal: Ability to maintain appropriate glucose levels will improve Outcome: Progressing   Problem: Nutritional: Goal: Maintenance of adequate nutrition will improve Outcome: Progressing Goal: Progress toward achieving an optimal weight will improve Outcome: Progressing   Problem: Skin Integrity: Goal: Risk for impaired skin integrity will decrease Outcome: Progressing   Problem: Tissue Perfusion: Goal: Adequacy of tissue perfusion will improve Outcome: Progressing   Problem: Education: Goal: Knowledge of General Education information will improve Description: Including pain rating scale, medication(s)/side effects and non-pharmacologic comfort measures Outcome: Progressing   Problem: Health Behavior/Discharge Planning: Goal: Ability to manage health-related needs will improve Outcome: Progressing   Problem: Clinical Measurements: Goal: Ability to maintain clinical measurements within normal limits will improve Outcome: Progressing Goal: Will remain free from infection Outcome: Progressing Goal: Diagnostic test results will improve Outcome:  Progressing Goal: Respiratory complications will improve Outcome: Progressing Goal: Cardiovascular complication will be avoided Outcome: Progressing   Problem: Activity: Goal: Risk for activity intolerance will decrease Outcome: Progressing   Problem: Nutrition: Goal: Adequate nutrition will be maintained Outcome: Progressing   Problem: Coping: Goal: Level of anxiety will decrease Outcome: Progressing   Problem: Elimination: Goal: Will not experience complications related to bowel motility Outcome: Progressing Goal: Will not experience complications related to urinary retention Outcome: Progressing   Problem: Pain Managment: Goal: General experience of comfort will improve Outcome: Progressing   Problem: Safety: Goal: Ability to remain free from injury will improve Outcome: Progressing   Problem: Skin Integrity: Goal: Risk for impaired skin integrity will decrease Outcome: Progressing

## 2022-04-04 NOTE — Progress Notes (Signed)
Tried to stop Bipap as ordered, desats to 80's. Placed on o2 [place]  pulse ox-85 % and pt asked to be placed back on bipap. MD aware continue to monitor.

## 2022-04-04 NOTE — Progress Notes (Addendum)
PROGRESS NOTE    Eric Scott  WCB:[id number] DOB: [birth year] DOA: 03/28/2022 PCP: [name], MD    No chief complaint on file.   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 Sharol Given after referral from PCP and had exposed hardware He was admitted by Dr. Due to 10/25 underwent left ankle hardware removal with partial distal fibular excision application of 95 cm Kerecis micro powder and local tissue arrangement 15 X3   ID saw the patient for antibiotic recommendations   Postoperatively he was planned for discharge but then developed shortness of breath on 10/29 and hospitalist service was contacted He was placed on BiPAP sent to stepdown unit    Assessment & Plan:   Principal Problem:   Leg wound, left, sequela Active Problems:   Acute on chronic respiratory failure with hypoxia ([name])   Chronic pain (back, legs)   Stage 4 chronic kidney disease ([name])   Type 2 diabetes mellitus with stage 4 chronic kidney disease, with [place]-term current use of insulin ([name])   HTN (hypertension)   Mood disorder ([name])   Leg wound, left, 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 -Line was placed 10/31 .   Acute on chronic respiratory failure with hypoxia ([name]) -Patient with COPD, on 3L home O2 -He has a respiratory distress the last 2 nights requiring BIPAP -Continue home Breo,  Duonebs -Multiple opacities on imaging most likely related to volume overload than actual infection given no leukocytosis, no fever but he is empirically covered with IV antibiotics for his osteomyelitis. -Improving, remains on as needed BiPAP, will try to watch him for next 24 hours to see if he does not require BiPAP. -improving with diuresis -Encouraged to use incentive spirometer  Chronic pain (back, legs) -He has chronic pain in addition to acute pain -Cannot review PDMP since he is in a SNF -Continue Norco, Neurontin or as per orthopedics   Stage 4 chronic kidney disease ([place]) -Appears to be stable at this time -Attempt to avoid nephrotoxic medications -Recheck BMP periodically -Continue calcitriol -Continue colchicine for gout   Mood disorder ([place]) -Continue fluoxetine, alprazolam   HTN (hypertension) -Continue home amlodipine, metoprolol   Type 2 diabetes mellitus with stage 4 chronic kidney disease, with [place]-term current use of insulin ([name]) -A1c is 5.8, indicating good control -Continue glargine -Cover with moderate-scale SSI  Anemia of chronic kidney disease -No indication to transfuse, continue to monitor     DVT prophylaxis: SCD, will start [place] heaprin today Code Status: (Full) Family Communication: None at bedside  Status is: Inpatient  Consultants:  Ortho >> TRH   Subjective:  No significant events overnight, he did ask for BiPAP earlier today, but otherwise appears comfortable  Objective: Vitals:   04/04/22 0000 04/04/22 0242 04/04/22 0400 04/04/22 0748  BP: (!) 147/64  (!) 163/69 (!) 150/97  Pulse: 63  68 66  Resp: 15  19 (!) 24  Temp:  98.1 F (36.7 C) (!) 97 F (36.1 C)  TempSrc:   Oral Axillary  SpO2: 92% 98% 94% 100%  Weight:      Height:        Intake/Output Summary (Last 24 hours) at 04/04/2022 1124 Last data filed at 04/04/2022 0800 Gross per 24 hour  Intake 330 ml  Output 900 ml  Net -570 ml   Filed Weights   03/28/22 0641   Weight: 115.7 kg    Examination:  Awake Alert, BiPAP this morning Symmetrical Chest wall movement, Good air movement bilaterally, no wheezing RRR,No Gallops,Rubs or new Murmurs, No Parasternal Heave +ve B.Sounds, Abd Soft, No tenderness, No rebound - guarding or rigidity. No Cyanosis, Clubbing or edema, left foot bandage connected to wound VAC    Data Reviewed: I have personally reviewed following labs and imaging studies  CBC: Recent Labs  Lab 04/02/22 0018 04/04/22 0435  WBC 9.5 8.3  HGB 8.5* 8.1*  HCT 26.1* 25.8*  MCV 95.3 95.6  PLT 198 638    Basic Metabolic Panel: Recent Labs  Lab 03/31/22 0545 04/02/22 0018 04/04/22 0435  NA 145 143 140  K 3.7 3.7 3.8  CL 113* 110 107  CO2 21* 22 21*  GLUCOSE 101* 194* 124*  BUN 57* 57* 64*  CREATININE 3.61* 3.40* 3.29*  CALCIUM 8.7* 8.6* 8.5*    GFR: Estimated Creatinine Clearance: 27 mL/min (A) (by C-G formula based on SCr of 3.29 mg/dL (H)).  Liver Function Tests: No results for input(s): "AST", "ALT", "ALKPHOS", "BILITOT", "PROT", "ALBUMIN" in the last 168 hours.  CBG: Recent Labs  Lab 04/03/22 0605 04/03/22 1052 04/03/22 1609 04/03/22 2118 04/04/22 0612  GLUCAP 100* 106* 146* 117* 120*     Recent Results (from the past 240 hour(s))  Aerobic/Anaerobic Culture w Gram Stain (surgical/deep wound)     Status: None   Collection Time: 03/28/22  9:30 AM   Specimen: Wound; Tissue  Result Value Ref Range Status   Specimen Description WOUND  Final   Special Requests ANKLE  Final   Gram Stain NO WBC SEEN NO ORGANISMS SEEN   Final   Culture   Final    RARE ACINETOBACTER CALCOACETICUS/BAUMANNII COMPLEX RARE MORGANELLA MORGANII RARE ENTEROCOCCUS FAECALIS RARE METHICILLIN RESISTANT STAPHYLOCOCCUS AUREUS VANCOMYCIN RESISTANT ENTEROCOCCUS ISOLATED NO ANAEROBES ISOLATED Performed at [facility], [address], [place], [place] [address]    Report Status 04/02/2022 FINAL  Final   Organism ID, Bacteria  ACINETOBACTER CALCOACETICUS/BAUMANNII COMPLEX  Final   Organism ID, Bacteria MORGANELLA MORGANII  Final   Organism ID, Bacteria ENTEROCOCCUS FAECALIS  Final   Organism ID, Bacteria METHICILLIN RESISTANT STAPHYLOCOCCUS AUREUS  Final      Susceptibility   Acinetobacter calcoaceticus/baumannii complex - MIC*    CEFTAZIDIME >=64 RESISTANT Resistant     CIPROFLOXACIN >=4 RESISTANT Resistant     GENTAMICIN <=1 SENSITIVE Sensitive     IMIPENEM 0.5 SENSITIVE Sensitive     PIP/TAZO >=128 RESISTANT Resistant     TRIMETH/SULFA <=20 SENSITIVE Sensitive     AMPICILLIN/SULBACTAM <=2 SENSITIVE Sensitive     * RARE ACINETOBACTER CALCOACETICUS/BAUMANNII COMPLEX   Enterococcus faecalis - MIC*    AMPICILLIN <=2 SENSITIVE Sensitive     VANCOMYCIN >=32 RESISTANT Resistant     GENTAMICIN SYNERGY SENSITIVE Sensitive     LINEZOLID 2 SENSITIVE Sensitive     * RARE ENTEROCOCCUS FAECALIS   Morganella morganii - MIC*    AMPICILLIN >=32 RESISTANT Resistant     CEFAZOLIN >=64 RESISTANT Resistant  CEFTAZIDIME 32 RESISTANT Resistant     CIPROFLOXACIN 2 RESISTANT Resistant     GENTAMICIN <=1 SENSITIVE Sensitive     IMIPENEM 4 SENSITIVE Sensitive     TRIMETH/SULFA >=320 RESISTANT Resistant     AMPICILLIN/SULBACTAM >=32 RESISTANT Resistant     PIP/TAZO <=4 SENSITIVE Sensitive     * RARE MORGANELLA MORGANII   Methicillin resistant staphylococcus aureus - MIC*    CIPROFLOXACIN >=8 RESISTANT Resistant     ERYTHROMYCIN >=8 RESISTANT Resistant     GENTAMICIN <=0.5 SENSITIVE Sensitive     OXACILLIN >=4 RESISTANT Resistant     TETRACYCLINE <=1 SENSITIVE Sensitive     VANCOMYCIN 1 SENSITIVE Sensitive     TRIMETH/SULFA <=10 SENSITIVE Sensitive     CLINDAMYCIN <=0.25 SENSITIVE Sensitive     RIFAMPIN <=0.5 SENSITIVE Sensitive     Inducible Clindamycin NEGATIVE Sensitive     * RARE METHICILLIN RESISTANT STAPHYLOCOCCUS AUREUS         Radiology Studies: IR Fluoro Guide CV Line Right  Result Date:  04/03/2022 INDICATION: Poor venous access. Chronic renal insufficiency. In need of durable intravenous access for antibiotic administration. EXAM: ULTRASOUND AND FLUOROSCOPIC GUIDED PLACEMENT OF TUNNELED CENTRAL VENOUS CATHETER MEDICATIONS: None ANESTHESIA/SEDATION: None FLUOROSCOPY TIME:  18 seconds (8 mGy) COMPLICATIONS: None immediate. PROCEDURE: Informed written consent was obtained from the patient after a discussion of the risks, benefits, and alternatives to treatment. Questions regarding the procedure were encouraged and answered. The right neck and chest were prepped with chlorhexidine in a sterile fashion, and a sterile drape was applied covering the operative field. Maximum barrier sterile technique with sterile gowns and gloves were used for the procedure. A timeout was performed prior to the initiation of the procedure. After the overlying soft tissues were anesthetized, a small venotomy incision was created and a micropuncture kit was utilized to access the internal jugular vein. Real-time ultrasound guidance was utilized for vascular access including the acquisition of a permanent ultrasound image documenting patency of the accessed vessel. The microwire was utilized to measure appropriate catheter length. The micropuncture sheath was exchanged for a peel-away sheath over a guidewire. A 5 French dual lumen tunneled central venous catheter measuring 23 cm was tunneled in a retrograde fashion from the anterior chest wall to the venotomy incision. The catheter was then placed through the peel-away sheath with tip ultimately positioned at the superior caval-atrial junction. Final catheter positioning was confirmed and documented with a spot radiographic image. The catheter aspirates and flushes normally. The catheter was flushed with appropriate volume heparin dwells. The catheter exit site was secured with a 0-Prolene retention suture. The venotomy incision was closed with Dermabond and Steri-strips.  Dressings were applied. The patient tolerated the procedure well without immediate post procedural complication. FINDINGS: After catheter placement, the tip lies within the superior cavoatrial junction. The catheter aspirates and flushes normally and is ready for immediate use. IMPRESSION: Successful placement of 23 cm dual lumen tunneled central venous catheter via the right internal jugular vein with tip terminating at the superior caval atrial junction. The catheter is ready for immediate use. Electronically Signed   By: John  Watts M.D.   On: 04/03/2022 10:05   IR US Guide Vasc Access Right  Result Date: 04/03/2022 INDICATION: Poor venous access. Chronic renal insufficiency. In need of durable intravenous access for antibiotic administration. EXAM: ULTRASOUND AND FLUOROSCOPIC GUIDED PLACEMENT OF TUNNELED CENTRAL VENOUS CATHETER MEDICATIONS: None ANESTHESIA/SEDATION: None FLUOROSCOPY TIME:  18 seconds (8 mGy) COMPLICATIONS: None immediate. PROCEDURE: Informed written consent was   obtained from the patient after a discussion of the risks, benefits, and alternatives to treatment. Questions regarding the procedure were encouraged and answered. The right neck and chest were prepped with chlorhexidine in a sterile fashion, and a sterile drape was applied covering the operative field. Maximum barrier sterile technique with sterile gowns and gloves were used for the procedure. A timeout was performed prior to the initiation of the procedure. After the overlying soft tissues were anesthetized, a small venotomy incision was created and a micropuncture kit was utilized to access the internal jugular vein. Real-time ultrasound guidance was utilized for vascular access including the acquisition of a permanent ultrasound image documenting patency of the accessed vessel. The microwire was utilized to measure appropriate catheter length. The micropuncture sheath was exchanged for a peel-away sheath over a guidewire. A 5  French dual lumen tunneled central venous catheter measuring 23 cm was tunneled in a retrograde fashion from the anterior chest wall to the venotomy incision. The catheter was then placed through the peel-away sheath with tip ultimately positioned at the superior caval-atrial junction. Final catheter positioning was confirmed and documented with a spot radiographic image. The catheter aspirates and flushes normally. The catheter was flushed with appropriate volume heparin dwells. The catheter exit site was secured with a 0-Prolene retention suture. The venotomy incision was closed with Dermabond and Steri-strips. Dressings were applied. The patient tolerated the procedure well without immediate post procedural complication. FINDINGS: After catheter placement, the tip lies within the superior cavoatrial junction. The catheter aspirates and flushes normally and is ready for immediate use. IMPRESSION: Successful placement of 23 cm dual lumen tunneled central venous catheter via the right internal jugular vein with tip terminating at the superior caval atrial junction. The catheter is ready for immediate use. Electronically Signed   By: John  Watts M.D.   On: 04/03/2022 10:05        Scheduled Meds:  ALPRAZolam  0.5 mg Oral QHS   amLODipine  10 mg Oral Daily   aspirin EC  81 mg Oral Q breakfast   calcitRIOL  0.25 mcg Oral Daily   Chlorhexidine Gluconate Cloth  6 each Topical Daily   colchicine  0.6 mg Oral Daily   docusate sodium  100 mg Oral BID   FLUoxetine  40 mg Oral Daily   fluticasone furoate-vilanterol  1 puff Inhalation Daily   gabapentin  300 mg Oral QHS   insulin aspart  0-15 Units Subcutaneous TID WC   insulin aspart  4 Units Subcutaneous TID WC   insulin glargine-yfgn  20 Units Subcutaneous QHS   ipratropium-albuterol  3 mL Nebulization Q6H   metoprolol tartrate  50 mg Oral BID   tamsulosin  0.4 mg Oral QPC supper   torsemide  80 mg Oral BID   Continuous Infusions:  sodium chloride  Stopped (04/01/22 0827)   DAPTOmycin (CUBICIN) 750 mg in sodium chloride 0.9 % IVPB 750 mg (04/03/22 1848)   meropenem (MERREM) IV 500 mg (04/04/22 0444)   methocarbamol (ROBAXIN) IV       LOS: 6 days       Dawood Elgergawy, MD [facility]   To contact the attending provider between 7A-7P or the covering provider during after hours 7P-7A, please log into the web site [email or web address] and access using universal [place] password for that web site. If you do not have the password, please call the hospital operator.  04/04/2022, 11:24 AM    

## 2022-04-05 ENCOUNTER — Inpatient Hospital Stay (HOSPITAL_COMMUNITY): Payer: Medicare (Managed Care)

## 2022-04-05 DIAGNOSIS — I4891 Unspecified atrial fibrillation: Secondary | ICD-10-CM

## 2022-04-05 DIAGNOSIS — I5033 Acute on chronic diastolic (congestive) heart failure: Secondary | ICD-10-CM

## 2022-04-05 DIAGNOSIS — J9621 Acute and chronic respiratory failure with hypoxia: Secondary | ICD-10-CM

## 2022-04-05 DIAGNOSIS — S81802S Unspecified open wound, left lower leg, sequela: Secondary | ICD-10-CM | POA: Diagnosis not present

## 2022-04-05 DIAGNOSIS — I480 Paroxysmal atrial fibrillation: Secondary | ICD-10-CM | POA: Diagnosis not present

## 2022-04-05 LAB — ECHOCARDIOGRAM COMPLETE
Area-P 1/2: 4.86 cm2
Calc EF: 65.6 %
Height: 72 in
S' Lateral: 3.55 cm
Single Plane A2C EF: 69.7 %
Single Plane A4C EF: 62.4 %
Weight: 4080 oz

## 2022-04-05 LAB — BASIC METABOLIC PANEL
Anion gap: 10 (ref 5–15)
BUN: 61 mg/dL — ABNORMAL HIGH (ref 8–23)
CO2: 23 mmol/L (ref 22–32)
Calcium: 8.5 mg/dL — ABNORMAL LOW (ref 8.9–10.3)
Chloride: 110 mmol/L (ref 98–111)
Creatinine, Ser: 3.14 mg/dL — ABNORMAL HIGH (ref 0.61–1.24)
GFR, Estimated: 20 mL/min — ABNORMAL LOW (ref >60)
Glucose, Bld: 111 mg/dL — ABNORMAL HIGH (ref 70–99)
Potassium: 4 mmol/L (ref 3.5–5.1)
Sodium: 143 mmol/L (ref 135–145)

## 2022-04-05 LAB — CBC
HCT: 24.7 % — ABNORMAL LOW (ref 39.0–52.0)
Hemoglobin: 7.7 g/dL — ABNORMAL LOW (ref 13.0–17.0)
MCH: 30.2 pg (ref 26.0–34.0)
MCHC: 31.2 g/dL (ref 30.0–36.0)
MCV: 96.9 fL (ref 80.0–100.0)
Platelets: 225 10*3/uL (ref 150–400)
RBC: 2.55 MIL/uL — ABNORMAL LOW (ref 4.22–5.81)
RDW: 14.5 % (ref 11.5–15.5)
WBC: 7.7 10*3/uL (ref 4.0–10.5)
nRBC: 0 % (ref 0.0–0.2)

## 2022-04-05 LAB — GLUCOSE, CAPILLARY
Glucose-Capillary: 117 mg/dL — ABNORMAL HIGH (ref 70–99)
Glucose-Capillary: 113 mg/dL — ABNORMAL HIGH (ref 70–99)
Glucose-Capillary: 119 mg/dL — ABNORMAL HIGH (ref 70–99)
Glucose-Capillary: 154 mg/dL — ABNORMAL HIGH (ref 70–99)
Glucose-Capillary: 144 mg/dL — ABNORMAL HIGH (ref 70–99)

## 2022-04-05 MED ORDER — PERFLUTREN LIPID MICROSPHERE
1.0000 mL | INTRAVENOUS | Status: AC | PRN
  Administered 2022-04-05: 3 mL via INTRAVENOUS

## 2022-04-05 MED ORDER — FUROSEMIDE 10 MG/ML IJ SOLN: 60.0000 mg | Freq: Two times a day (BID) | INTRAMUSCULAR | Status: DC

## 2022-04-05 MED ORDER — FUROSEMIDE 10 MG/ML IJ SOLN
80.0000 mg | Freq: Two times a day (BID) | INTRAMUSCULAR | Status: DC
  Administered 2022-04-05 – 2022-04-11 (×12): 80 mg via INTRAVENOUS
  Filled 2022-04-05 (×13): qty 8

## 2022-04-05 NOTE — Progress Notes (Signed)
Tele notified of patient in afib.  Patient with no additional symptoms.  VSS.  EKG completed and MD notified.  Verbal order to repeat EKG in a few hours to see if afib is sustained.

## 2022-04-05 NOTE — Plan of Care (Signed)
  Problem: Fluid Volume: Goal: Ability to maintain a balanced intake and output will improve Outcome: Progressing   Problem: Metabolic: Goal: Ability to maintain appropriate glucose levels will improve Outcome: Progressing   Problem: Clinical Measurements: Goal: Respiratory complications will improve Outcome: Progressing Goal: Cardiovascular complication will be avoided Outcome: Progressing   Problem: Elimination: Goal: Will not experience complications related to bowel motility Outcome: Progressing Goal: Will not experience complications related to urinary retention Outcome: Progressing   Problem: Pain Managment: Goal: General experience of comfort will improve Outcome: Progressing   Problem: Coping: Goal: Level of anxiety will decrease Outcome: Not Progressing

## 2022-04-05 NOTE — Progress Notes (Signed)
Pharmacy Antibiotic Note  Eric Scott is a 70 y.o. male admitted on 03/28/2022 with  osteomyelitis of left ankle s/p debridement and removal of hardware on 03/28/22. Pharmacy has been consulted for Meropenem and Daptomycin dosing.  Changing from Linezolid and Cetriaxone with multiple organisms growing in operative culture.  Renal function stable, ClCr ~14m/min.  Plan: Meropenem 500 mg IV q12h Daptomycin 750 mg (~8 mg/kg adjusted body weight) IV q48hrs. Monitor clinical status, renal function, CK Q Fri OPAT entered - end date 12/6    Height: 6' (182.9 cm) Weight: 115.7 kg (255 lb) IBW/kg (Calculated) : 77.6  Temp (24hrs), Avg:97.9 F (36.6 C), Min:97.6 F (36.4 C), Max:98.3 F (36.8 C)  Recent Labs  Lab 03/31/22 0545 04/02/22 0018 04/04/22 0435 04/05/22 0341  WBC  --  9.5 8.3 7.7  CREATININE 3.61* 3.40* 3.29* 3.14*     Estimated Creatinine Clearance: 28.3 mL/min (A) (by C-G formula based on SCr of 3.14 mg/dL (H)).    Allergies  Allergen Reactions   Baclofen     unknown   Blueberry Flavor Other (See Comments)    Unknown   Cucumber Extract Other (See Comments)    "Fells like I'm having a heart attack"   Flexeril [Cyclobenzaprine Hcl] Other (See Comments)    "whole body  Tremors" (05/27/2012)   Kiwi Extract Other (See Comments)    "feels like I'm having a heart attack"    Antimicrobials this admission: Zyvox PO from PTA 10/24 >> 10/27 Ceftriaxone from PTA 10/24 (was IM outpt IV inpt) >> 10/27 Daptomycin 10/27 >> 12/6 Meropenem 10/27 >> 12/6  CK 10/28: 36   Microbiology results: 10/25 surgical wound cx: no organims on gram stain > recincubated > rare Acinetobacter, rare Morganella, rare E faecalis, rare MRSA  Thank you for allowing pharmacy to be a part of this patient's care.  LBenetta Spar PharmD, BCPS, BCCP Clinical Pharmacist  Please check AMION for all [place] numbers After 10:00 PM, call [phone]

## 2022-04-05 NOTE — TOC Progression Note (Signed)
Transition of Care [facility]) - Progression Note    Patient Details  Name: Eric Scott MRN: [id number] Date of Birth: 01-29-1951  Transition of Care [facility]) CM/SW [place], [place] Phone Number: 04/05/2022, 9:33 AM  Clinical Narrative:     CSW left message with Jackelyn Poling in admissions with [facility] requesting return call. Sent text message requesting return call/text as well.    Expected Discharge Plan: [place] Barriers to Discharge: Continued Medical Work up  Expected Discharge Plan and Services Expected Discharge Plan: [place] In-house Referral: Clinical Social Work   Post Acute Care Choice: Resumption of Svcs/PTA Provider Living arrangements for the past 2 months: [place]                                       Social Determinants of Health (SDOH) Interventions    Readmission Risk Interventions     No data to display

## 2022-04-05 NOTE — Progress Notes (Signed)
RT called to bedside by RN due to pt in resp. Distress.  RN placed pt on BiPAP pt tolerating well, SPO2 100%, vitals stable. RN at bedside, RT will monitor.

## 2022-04-05 NOTE — Progress Notes (Addendum)
PROGRESS NOTE    Eric Scott  ZWC:[id number] DOB: 11-02-1950 DOA: 03/28/2022 PCP: Eric Renshaw, MD    No chief complaint on file.   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 Sharol Scott after referral from PCP and had exposed hardware He was admitted by Dr. Due to 10/25 underwent left ankle hardware removal with partial distal fibular excision application of 95 cm Kerecis micro powder and local tissue arrangement 15 X3   ID saw the patient for antibiotic recommendations   Postoperatively he was planned for discharge but then developed shortness of breath on 10/29 and hospitalist service was contacted He was placed on BiPAP sent to stepdown unit    Assessment & Plan:   Principal Problem:   Leg wound, left, sequela Active Problems:   Acute on chronic respiratory failure with hypoxia ([name])   Chronic pain (back, legs)   Stage 4 chronic kidney disease ([place])   Type 2 diabetes mellitus with stage 4 chronic kidney disease, with [place]-term current use of insulin ([name])   HTN (hypertension)   Mood disorder ([name])   Leg wound, left, 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 -Line was placed 10/31 .   Acute on chronic respiratory failure with hypoxia ([name]) -Patient with COPD, on 3L home O2 -Continue home Breo, Duonebs -Multiple opacities on imaging most likely related to volume  overload than actual infection Scott no leukocytosis, no fever but he is empirically covered with IV antibiotics for his osteomyelitis. -Patient with increased oxygen requirement today, up to 7 L he was encouraged to use incentive spirometer, discussed with staff to keep out of bed to chair, he still needing BiPAP as needed . -Repeat x-ray still showing opacities likely to volume overload, will change his torsemide 80 mg twice daily to IV Lasix 60 mg IV twice daily    Atrial fibrillation, new diagnosis  -We will obtain 2D echo, heart rate is controlled, cardiology consulted regarding further recommendations   Acute on chronic diastolic CHF -Volume overload on imaging, and significant oxygen requirement, torsemide to IV Lasix.  Chronic pain (back, legs) -He has chronic pain in addition to acute pain -Cannot review PDMP since he is in a SNF -Continue Norco, Neurontin or as per orthopedics   Stage 4 chronic kidney disease ([place]) -Appears to be stable at this time -Attempt to avoid nephrotoxic medications -Recheck BMP periodically -Continue calcitriol -Continue colchicine for gout   Mood disorder ([place]) -Continue fluoxetine, alprazolam   HTN (hypertension) -Continue home amlodipine, metoprolol   Type 2 diabetes mellitus with stage 4 chronic kidney disease, with [place]-term current use of insulin ([name]) -A1c is 5.8, indicating good control -Continue glargine -Cover with moderate-scale SSI  Anemia of chronic kidney disease -No indication to transfuse, continue to monitor     DVT prophylaxis: SCD, will start Dane heaprin today Code Status: (Full) Family Communication: None at bedside  Status is: Inpatient  Consultants:  Ortho >>  [facility]    Subjective:  He did require BiPAP intermittently yesterday, this morning he is up to 7 L oxygen.  No significant events overnight, he did ask for BiPAP earlier today, but otherwise appears comfortable  Objective: Vitals:   04/04/22  2346 04/05/22 0336 04/05/22 0800 04/05/22 0849  BP: (!) 120/46 132/63 (!) 140/65   Pulse:      Resp:  18    Temp: 98 F (36.7 C) 98.3 F (36.8 C)    TempSrc: Axillary Oral    SpO2:  95%  100%  Weight:      Height:        Intake/Output Summary (Last 24 hours) at 04/05/2022 1019 Last data filed at 04/05/2022 0734 Gross per 24 hour  Intake 720 ml  Output 1650 ml  Net -930 ml   Filed Weights   03/28/22 0641  Weight: 115.7 kg    Examination:    Awake Alert, Oriented X 3, frail, deconditioned  symmetrical Chest wall movement, managed air entry at the status, scattered Rales Irregular +ve B.Sounds, Abd Soft, No tenderness, No rebound - guarding or rigidity. No Cyanosis, Clubbing ,trace  edema, foot bandaged and connected to wound VAC   Data Reviewed: I have personally reviewed following labs and imaging studies  CBC: Recent Labs  Lab 04/02/22 0018 04/04/22 0435 04/05/22 0341  WBC 9.5 8.3 7.7  HGB 8.5* 8.1* 7.7*  HCT 26.1* 25.8* 24.7*  MCV 95.3 95.6 96.9  PLT 198 210 341    Basic Metabolic Panel: Recent Labs  Lab 03/31/22 0545 04/02/22 0018 04/04/22 0435 04/05/22 0341  NA 145 143 140 143  K 3.7 3.7 3.8 4.0  CL 113* 110 107 110  CO2 21* 22 21* 23  GLUCOSE 101* 194* 124* 111*  BUN 57* 57* 64* 61*  CREATININE 3.61* 3.40* 3.29* 3.14*  CALCIUM 8.7* 8.6* 8.5* 8.5*    GFR: Estimated Creatinine Clearance: 28.3 mL/min (A) (by C-G formula based on SCr of 3.14 mg/dL (H)).  Liver Function Tests: No results for input(s): "AST", "ALT", "ALKPHOS", "BILITOT", "PROT", "ALBUMIN" in the last 168 hours.  CBG: Recent Labs  Lab 04/04/22 1128 04/04/22 1550 04/04/22 2102 04/05/22 0419 04/05/22 0609  GLUCAP 132* 154* 97 117* 113*     Recent Results (from the past 240 hour(s))  Aerobic/Anaerobic Culture w Gram Stain (surgical/deep wound)     Status: None   Collection Time: 03/28/22  9:30 AM   Specimen: Wound; Tissue  Result Value Ref Range Status   Specimen  Description WOUND  Final   Special Requests ANKLE  Final   Gram Stain NO WBC SEEN NO ORGANISMS SEEN   Final   Culture   Final    RARE ACINETOBACTER CALCOACETICUS/BAUMANNII COMPLEX RARE MORGANELLA MORGANII RARE ENTEROCOCCUS FAECALIS RARE METHICILLIN RESISTANT STAPHYLOCOCCUS AUREUS VANCOMYCIN RESISTANT ENTEROCOCCUS ISOLATED NO ANAEROBES ISOLATED Performed at [facility], [address] [place], [place] [address]    Report Status 04/02/2022 FINAL  Final   Organism ID, Bacteria ACINETOBACTER CALCOACETICUS/BAUMANNII COMPLEX  Final   Organism ID, Bacteria MORGANELLA MORGANII  Final   Organism ID, Bacteria ENTEROCOCCUS FAECALIS  Final   Organism ID, Bacteria METHICILLIN RESISTANT STAPHYLOCOCCUS AUREUS  Final      Susceptibility   Acinetobacter calcoaceticus/baumannii complex - MIC*    CEFTAZIDIME >=64 RESISTANT Resistant     CIPROFLOXACIN >=4 RESISTANT Resistant     GENTAMICIN <=1 SENSITIVE Sensitive     IMIPENEM 0.5 SENSITIVE Sensitive     PIP/TAZO >=128 RESISTANT Resistant  TRIMETH/SULFA <=20 SENSITIVE Sensitive     AMPICILLIN/SULBACTAM <=2 SENSITIVE Sensitive     * RARE ACINETOBACTER CALCOACETICUS/BAUMANNII COMPLEX   Enterococcus faecalis - MIC*    AMPICILLIN <=2 SENSITIVE Sensitive     VANCOMYCIN >=32 RESISTANT Resistant     GENTAMICIN SYNERGY SENSITIVE Sensitive     LINEZOLID 2 SENSITIVE Sensitive     * RARE ENTEROCOCCUS FAECALIS   Morganella morganii - MIC*    AMPICILLIN >=32 RESISTANT Resistant     CEFAZOLIN >=64 RESISTANT Resistant     CEFTAZIDIME 32 RESISTANT Resistant     CIPROFLOXACIN 2 RESISTANT Resistant     GENTAMICIN <=1 SENSITIVE Sensitive     IMIPENEM 4 SENSITIVE Sensitive     TRIMETH/SULFA >=320 RESISTANT Resistant     AMPICILLIN/SULBACTAM >=32 RESISTANT Resistant     PIP/TAZO <=4 SENSITIVE Sensitive     * RARE MORGANELLA MORGANII   Methicillin resistant staphylococcus aureus - MIC*    CIPROFLOXACIN >=8 RESISTANT Resistant     ERYTHROMYCIN >=8  RESISTANT Resistant     GENTAMICIN <=0.5 SENSITIVE Sensitive     OXACILLIN >=4 RESISTANT Resistant     TETRACYCLINE <=1 SENSITIVE Sensitive     VANCOMYCIN 1 SENSITIVE Sensitive     TRIMETH/SULFA <=10 SENSITIVE Sensitive     CLINDAMYCIN <=0.25 SENSITIVE Sensitive     RIFAMPIN <=0.5 SENSITIVE Sensitive     Inducible Clindamycin NEGATIVE Sensitive     * RARE METHICILLIN RESISTANT STAPHYLOCOCCUS AUREUS         Radiology Studies: DG Chest Port 1 View  Result Date: 04/05/2022 CLINICAL DATA:  Hypoxia EXAM: PORTABLE CHEST 1 VIEW COMPARISON:  Chest x-ray dated April 01, 2022 FINDINGS: Interval placement of right central venous line with tip positioned over the expected area of the upper right atrium. Cardiac and mediastinal contours are unchanged. Similar lower lung predominant heterogeneous opacities. No large pleural effusion or evidence of pneumothorax. IMPRESSION: 1. Interval placement of right central venous line with tip positioned over the expected area of the upper right atrium. 2. Similar lower lung predominant heterogeneous opacities which are likely Eric Scott to pulmonary edema. Electronically Signed   By: Yetta Glassman M.D.   On: 04/05/2022 08:27        Scheduled Meds:  ALPRAZolam  0.5 mg Oral QHS   amLODipine  10 mg Oral Daily   aspirin EC  81 mg Oral Q breakfast   calcitRIOL  0.25 mcg Oral Daily   Chlorhexidine Gluconate Cloth  6 each Topical Daily   colchicine  0.6 mg Oral Daily   docusate sodium  100 mg Oral BID   FLUoxetine  40 mg Oral Daily   fluticasone furoate-vilanterol  1 puff Inhalation Daily   furosemide  60 mg Intravenous BID   gabapentin  300 mg Oral QHS   heparin injection (subcutaneous)  5,000 Units Subcutaneous Q8H   insulin aspart  0-15 Units Subcutaneous TID WC   insulin aspart  4 Units Subcutaneous TID WC   insulin glargine-yfgn  10 Units Subcutaneous QHS   ipratropium-albuterol  3 mL Nebulization Q6H   metoprolol tartrate  50 mg Oral BID   tamsulosin   0.4 mg Oral QPC supper   Continuous Infusions:  sodium chloride Stopped (04/01/22 0827)   DAPTOmycin (CUBICIN) 750 mg in sodium chloride 0.9 % IVPB 750 mg (04/03/22 1848)   meropenem (MERREM) IV 500 mg (04/05/22 0556)   methocarbamol (ROBAXIN) IV       LOS: 7 days       Phillips Climes, MD  [facility]   To contact the attending provider between 7A-7P or the covering provider during after hours 7P-7A, please log into the web site [email or web address] and access using universal [place] password for that web site. If you do not have the password, please call the hospital operator.  04/05/2022, 10:19 AM

## 2022-04-05 NOTE — Consult Note (Addendum)
Cardiology Consultation   Patient ID: Eric Scott MRN: [id number]; DOB: July 06, 1950  Admit date: 03/28/2022 Date of Consult: 04/05/2022  PCP:  [name], MD   [facility] Providers Cardiologist:  None     Patient Profile:   Eric Scott is a 71 y.o. male with a hx of HTN, HFpEF, COPD onn 3L chronically,  CKD IV, DM, and anemia who is being seen 04/05/2022 for the evaluation of atrial fibrillation RVR at the request of Dr. Waldron Labs.  History of Present Illness:   Eric Scott is a 71 yo male with PMH noted above. He was seen by Dr. Johnsie Cancel 04/2019 as a referral for difficult to control BP and CHF. At this visit he was noted to be essential wheelchair bound. Chronic LE edema and followed by Dr. Donzetta Matters with [facility]. He was continued on his home medications and outpatient echo ordered/showed LVEF of 60-65%, severe LVH, normal RV size and function, trace MR.  He presented 10/25 with Dr. Sharol Given after developing an ulceration and infection in his left lateral ankle exposed hardware.  Underwent fibular excision and removal of deep retained hardware of the lateral ankle with a wound VAC placed.  Blood cultures obtained showing acinetobacter, Morganella, Enterococcus faecalis, and Staph aureus.  Infectious disease was consulted, placed on daptomycin and meropenem.  Evening of 10/29 he developed significant shortness of breath hypoxia requiring BiPAP and was transferred to progressive care.  Internal medicine was consulted.  Chest x-ray 10/29 concerning for HCAP/moderate bilateral interstitial opacities.  He was started on IV Lasix, continued on antibiotics.  Most recent labs show sodium 143, potassium 4, creatinine 3.1, WBC 7.7, hemoglobin 7.7.  Chest x-ray 11/2 with lower lung opacities likely due to pulmonary edema.  The morning of 11/2 around 3:47 AM he developed new onset atrial fibrillation with RVR which continued until around 10:20 AM at which time he converted back into sinus rhythm.   He did report feeling mild palpitations but main complaint is ongoing dyspnea which was present prior to episode of atrial fibrillation.  Cardiology now asked to evaluate.  Past Medical History:  Diagnosis Date   Anemia    Anxiety    Chronic pain    legs, back; MRI 05/2012 with mild thoracic degenerative changes no spinal stenosis    CKD (chronic kidney disease) 05/12/2019   Stage IV   COPD (chronic obstructive pulmonary disease) ([place])    COVID-19    Depression    Diabetic peripheral neuropathy ([name])    "chronic" (88/82/8003)   Diastolic CHF ([name])    Duodenal ulcer hemorrhage    DVT (deep venous thrombosis) ([name])    right upper arm   GERD (gastroesophageal reflux disease)    Hypercholesteremia    Hypertension    Iron deficiency anemia    Osteomyelitis of ankle ([name])    Peripheral edema    PVD (peripheral vascular disease) ([place])    Spinal stenosis    mild lumbar (MRI 05/2012)-L2-L3 to L4-L5 , mild lumbar foraminal stenosis    Stroke ([place]) 05/2012   Subacute, lacunar infarcts within the left basal ganglia and posterior limp of the left internal capsule/thalamus; "RUE; both feet weak" (05/27/2012)   Tremor    Type II diabetes mellitus ([place])     Past Surgical History:  Procedure Laterality Date   ANKLE SURGERY     APPLICATION OF WOUND VAC Left 03/28/2022   Procedure: APPLICATION OF WOUND VAC;  Surgeon: [name], MD;  Location: [facility];  Service: Orthopedics;  Laterality: Left;   ESOPHAGOGASTRODUODENOSCOPY (EGD) WITH PROPOFOL N/A 12/21/2016   [place]: Ulcerative reflux esophagitis, erosive gastropathy, extensive duodenal ulceration likely site of bleeding.  Pathology with mild gastritis, no H. pylori   ESOPHAGOGASTRODUODENOSCOPY (EGD) WITH PROPOFOL N/A 04/02/2017   Fields: ESOPAHGITIS/GASTRIC AND DUODENAL ULCERS HEALED. mild duodenitis   HARDWARE REMOVAL Left 03/28/2022   Procedure: LEFT ANKLE REMOVAL DEEP HARDWARE, REMOVE BONE AND APPLY TISSUE GRAFT;  Surgeon: [name],  MD;  Location: [facility];  Service: Orthopedics;  Laterality: Left;   HERNIA REPAIR  01/05/2004   "belly button" (05/27/2012)   IR FLUORO GUIDE CV LINE RIGHT  04/03/2022   IR US GUIDE VASC ACCESS RIGHT  04/03/2022     Home Medications:  Prior to Admission medications   Medication Sig Start Date End Date Taking? Authorizing Provider  acetaminophen (TYLENOL) 325 MG tablet Take 2 tablets (650 mg total) by mouth every 6 (six) hours as needed for mild pain, fever or headache (or Fever >/= 101). Patient taking differently: Take 650 mg by mouth every 12 (twelve) hours as needed for mild pain. 11/18/19  Yes Emokpae, Courage, MD  ALPRAZolam Duanne Moron) 0.5 MG tablet Take 0.5 mg by mouth at bedtime.   Yes [provider]  Amino Acids-Protein Hydrolys (PRO-STAT AWC) LIQD Take 30 mLs by mouth daily.   Yes [provider]  amLODipine (NORVASC) 10 MG tablet Take 1 tablet (10 mg total) by mouth daily. 05/21/19  Yes Emokpae, Courage, MD  ascorbic acid (VITAMIN C) 500 MG tablet Take 500 mg by mouth 2 (two) times daily.   Yes [provider]  aspirin EC 81 MG tablet Take 1 tablet (81 mg total) by mouth daily with breakfast. 11/18/19  Yes Emokpae, Courage, MD  BREO ELLIPTA 100-25 MCG/INH AEPB Inhale 1 puff into the lungs daily.  09/27/16  Yes [provider]  calcitRIOL (ROCALTROL) 0.25 MCG capsule Take 0.25 mcg by mouth daily.   Yes [provider]  calcium carbonate (TUMS - DOSED IN MG ELEMENTAL CALCIUM) 500 MG chewable tablet Chew 2 tablets by mouth 2 (two) times daily.   Yes [provider]  cefTRIAXone (ROCEPHIN) 1 g injection Inject 1 g into the muscle daily.   Yes [provider]  cholecalciferol (VITAMIN D) 1000 units tablet Take 1,000 Units by mouth daily.   Yes [provider]  coconut oil OIL Apply 1 Application topically every 6 (six) hours as needed (protection and prevention).   Yes [provider]  colchicine 0.6 MG tablet Take  0.6 mg by mouth daily.   Yes [provider]  cyanocobalamin 1000 MCG tablet Take 1,000 mcg by mouth daily.   Yes [provider]  ferrous sulfate 325 (65 FE) MG tablet Take 325 mg by mouth 2 (two) times daily.   Yes [provider]  FLUoxetine (PROZAC) 40 MG capsule Take 40 mg by mouth daily.   Yes [provider]  gabapentin (NEURONTIN) 300 MG capsule Take 1 capsule (300 mg total) by mouth at bedtime. 12/21/19  Yes Barton Dubois, MD  guaiFENesin 200 MG tablet Take 200 mg by mouth 2 (two) times daily.   Yes [provider]  HYDROcodone-acetaminophen (NORCO) 7.5-325 MG tablet Take 1 tablet by mouth 4 (four) times daily.   Yes [provider]  insulin aspart (NOVOLOG) 100 UNIT/ML injection Inject 2-10 Units into the skin See admin instructions. Sliding scale if 200-250 = 2 units, 251-300 = 4 units, 301-350 = 6 units,  351-400 = 8 units, 401-500 units = 10 units before meals and at betime   Yes [provider]  ipratropium-albuterol (DUONEB) 0.5-2.5 (3) MG/3ML SOLN Take 3 mLs by nebulization every 4 (four) hours as needed (shortness of breath / wheezing).   Yes [provider]  LANTUS 100 UNIT/ML injection Inject 32 Units into the skin at bedtime.  03/26/20  Yes [provider]  linezolid (ZYVOX) 600 MG tablet Take 600 mg by mouth 2 (two) times daily.   Yes [provider]  magnesium oxide (MAG-OX) 400 MG tablet Take 400 mg by mouth daily.   Yes [provider]  metoprolol tartrate (LOPRESSOR) 50 MG tablet Take 50 mg by mouth 2 (two) times daily. 04/27/20  Yes [provider]  Nutritional Supplement LIQD Take 120 mLs by mouth daily. "House supplement"   Yes [provider]  pantoprazole (PROTONIX) 40 MG tablet Take 1 tablet (40 mg total) by mouth daily before breakfast. 05/21/19 03/27/22 Yes Emokpae, Courage, MD  polyethylene glycol (MIRALAX / GLYCOLAX) 17 g packet Take 17 g by mouth  daily. For constipation   Yes [provider]  potassium bicarbonate (K-LYTE) 25 MEQ disintegrating tablet Take 25 mEq by mouth daily.   Yes [provider]  senna (SENOKOT) 8.6 MG tablet Take 2 tablets by mouth 2 (two) times daily.   Yes [provider]  tamsulosin (FLOMAX) 0.4 MG CAPS capsule Take 1 capsule (0.4 mg total) by mouth daily after supper. Patient taking differently: Take 0.4 mg by mouth daily. 11/18/19  Yes Emokpae, Courage, MD  torsemide (DEMADEX) 20 MG tablet Take 2 tablets (40 mg total) by mouth 2 (two) times daily. Patient taking differently: Take 80 mg by mouth 2 (two) times daily. 12/21/19  Yes Barton Dubois, MD    Inpatient Medications: Scheduled Meds:  ALPRAZolam  0.5 mg Oral QHS   amLODipine  10 mg Oral Daily   aspirin EC  81 mg Oral Q breakfast   calcitRIOL  0.25 mcg Oral Daily   Chlorhexidine Gluconate Cloth  6 each Topical Daily   colchicine  0.6 mg Oral Daily   docusate sodium  100 mg Oral BID   FLUoxetine  40 mg Oral Daily   fluticasone furoate-vilanterol  1 puff Inhalation Daily   furosemide  60 mg Intravenous BID   gabapentin  300 mg Oral QHS   heparin injection (subcutaneous)  5,000 Units Subcutaneous Q8H   insulin aspart  0-15 Units Subcutaneous TID WC   insulin aspart  4 Units Subcutaneous TID WC   insulin glargine-yfgn  10 Units Subcutaneous QHS   ipratropium-albuterol  3 mL Nebulization Q6H   metoprolol tartrate  50 mg Oral BID   tamsulosin  0.4 mg Oral QPC supper   Continuous Infusions:  sodium chloride Stopped (04/01/22 0827)   DAPTOmycin (CUBICIN) 750 mg in sodium chloride 0.9 % IVPB 750 mg (04/03/22 1848)   meropenem (MERREM) IV 500 mg (04/05/22 0556)   methocarbamol (ROBAXIN) IV     PRN Meds: acetaminophen, albuterol, bisacodyl, HYDROcodone-acetaminophen, HYDROcodone-acetaminophen, magnesium citrate, methocarbamol **OR** methocarbamol (ROBAXIN) IV, metoCLOPramide **OR** metoCLOPramide (REGLAN) injection,  ondansetron **OR** ondansetron (ZOFRAN) IV, polyethylene glycol  Allergies:    Allergies  Allergen Reactions   Baclofen     unknown   Blueberry Flavor Other (See Comments)    Unknown   Cucumber Extract Other (See Comments)    "Fells like I'm having a heart attack"   Flexeril [Cyclobenzaprine Hcl] Other (See Comments)    "whole body  Tremors" (05/27/2012)   Kiwi Extract Other (See Comments)    "feels like I'm having a heart attack"    Social History:   Social History   Socioeconomic History   Marital status: Legally Separated    Spouse name: Not on file   Number of children: Not on file   Years of education: Not on file   Highest education level: Not on file  Occupational History   Not on file  Tobacco Use   Smoking status: Former    Packs/day: 2.00    Years: 54.00    Total pack years: 108.00    Types: Cigarettes    Quit date: 12/12/2019    Years since quitting: 2.3   Smokeless tobacco: Never  Vaping Use   Vaping Use: Never used  Substance and Sexual Activity   Alcohol use: No    Comment: 05/27/2012 "drank gallons and gallons 20 yr ago or so; last drink  at least 10 yr ago"   Drug use: No   Sexual activity: Not Currently  Other Topics Concern   Not on file  Social History Narrative   Pt is living at [facility] for 6 years now    He is divorced. Has one kid.   Not working for about 7 years now. Used to do plumbing work.   Social Determinants of Health   Financial Resource Strain: Not on file  Food Insecurity: No Food Insecurity (03/28/2022)   Hunger Vital Sign    Worried About Running Out of Food in the Last Year: Never true    Ran Out of Food in the Last Year: Never true  Transportation Needs: No Transportation Needs (03/28/2022)   PRAPARE - Hydrologist (Medical): No    Lack of Transportation (Non-Medical): No  Physical Activity: Not on file  Stress: Not on file  Social Connections: Not on file  Intimate Partner  Violence: Not At Risk (03/28/2022)   Humiliation, Afraid, Rape, and Kick questionnaire    Fear of Current or Ex-Partner: No    Emotionally Abused: No    Physically Abused: No    Sexually Abused: No    Family History:    Family History  Problem Relation Age of Onset   Colon cancer Neg Hx      ROS:  Please see the history of present illness.   All other ROS reviewed and negative.     Physical Exam/Data:   Vitals:   04/05/22 0336 04/05/22 0800 04/05/22 0849 04/05/22 1108  BP: 132/63 (!) 140/65  (!) 147/66  Pulse:    63  Resp: 18   20  Temp: 98.3 F (36.8 C)     TempSrc: Oral     SpO2: 95%  100% 98%  Weight:      Height:        Intake/Output Summary (Last 24 hours) at 04/05/2022 1129 Last data filed at 04/05/2022 0734 Gross per 24 hour  Intake 720 ml  Output 1650 ml  Net -930 ml      03/28/2022    6:41 AM 11/09/2020    1:21 PM 10/26/2020    1:34 PM  Last 3 Weights  Weight (lbs) 255 lb 279 lb 15.8 oz 279 lb 15.8 oz  Weight (kg) 115.667 kg 127 kg 127 kg     Body mass index is 34.58 kg/m.  General:  Well nourished, well developed, in no acute distress HEENT: normal Neck: no JVD Vascular: No carotid bruits; Distal pulses  2+ bilaterally Cardiac:  normal S1, S2; RRR; no murmur  Lungs:  diminished, crackles in bases, rhonchi  Abd: soft, nontender, no hepatomegaly  Ext: no edema Musculoskeletal:  LLE wrapped with wound vac Skin: warm and dry  Neuro:  CNs 2-12 intact, no focal abnormalities noted Psych:  Normal affect   EKG:  The EKG was personally reviewed and demonstrates:  EKG 10/25 sinus bradycardia, 47 bpm, baseline artifact EKG 11/2 420am 76 bpm, atrial fibrillation  Telemetry:  Telemetry was personally reviewed and demonstrates: Sinus rhythm, PACs _0 :47 AM fibrillation with intermittent RVR--> conversion to sinus rhythm at 10:20 AM  Relevant CV Studies:  Echocardiogram: 12/2019  IMPRESSIONS     1. Left ventricular ejection fraction, by estimation, is  55 to 60%. The  left ventricle has normal function. Left ventricular endocardial border  not optimally defined to evaluate regional wall motion. There is severe  left ventricular hypertrophy. Left  ventricular diastolic parameters are indeterminate.   2. Right ventricular systolic function is normal. The right ventricular  size is normal.   3. The mitral valve was not well visualized. No evidence of mitral valve  regurgitation. No evidence of mitral stenosis.   4. The aortic valve was not well visualized. Aortic valve regurgitation  is not visualized. No aortic stenosis is present.   5. Aortic dilatation noted. 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   6. Technically difficult study, limited visualization   FINDINGS   Left Ventricle: Left ventricular ejection fraction, by estimation, is 55  to 60%. The left ventricle has normal function. Left ventricular  endocardial border not optimally defined to evaluate regional wall motion.  The left ventricular internal cavity  size was normal in size. There is severe left ventricular hypertrophy.  Left ventricular diastolic parameters are indeterminate.   Right Ventricle: The right ventricular size is normal. Right vetricular  wall thickness was not assessed. Right ventricular systolic function is  normal.   Left Atrium: Left atrial size was not well visualized.   Right Atrium: Right atrial size was not well visualized.   Pericardium: There is no evidence of pericardial effusion.   Mitral Valve: The mitral valve was not well visualized. No evidence of  mitral valve regurgitation. No evidence of mitral valve stenosis.   Tricuspid Valve: The tricuspid valve is not well visualized. Tricuspid  valve regurgitation is not demonstrated. No evidence of tricuspid  stenosis.   Aortic Valve: The aortic valve was not well visualized. Aortic valve  regurgitation is not visualized. No aortic stenosis is present. Aortic  valve  mean gradient measures 2.8 mmHg. Aortic valve peak gradient measures  6.4 mmHg. Aortic valve area, by VTI  measures 3.88 cm.   Pulmonic Valve: The pulmonic valve was not well visualized. Pulmonic valve  regurgitation is not visualized. No evidence of pulmonic stenosis.   Aorta: Aortic dilatation noted. There is mild dilatation of the aortic  root measuring 40 mm.   Pulmonary Artery: Indeterminant PASP, inadequate TR jet.   IAS/Shunts: The interatrial septum was not well visualized.    Laboratory Data:  High Sensitivity Troponin:  No results for input(s): "TROPONINIHS" in the last 720 hours.   Chemistry Recent Labs  Lab 04/02/22 0018 04/04/22 0435 04/05/22 0341  NA 143 140 143  K 3.7 3.8 4.0  CL 110 107 110  CO2 22 21* 23  GLUCOSE 194* 124* 111*  BUN 57* 64* 61*  CREATININE 3.40* 3.29* 3.14*  CALCIUM 8.6* 8.5* 8.5*  GFRNONAA 19* 19* 20*  ANIONGAP _0 No results for input(s): "PROT", "ALBUMIN", "AST", "ALT", "ALKPHOS", "BILITOT" in the last 168 hours. Lipids No results for input(s): "CHOL", "TRIG", "HDL", "LABVLDL", "LDLCALC", "CHOLHDL" in the last 168 hours.  Hematology Recent Labs  Lab 04/02/22 0018 04/04/22 0435 04/05/22 0341  WBC 9.5 8.3 7.7  RBC 2.74* 2.70* 2.55*  HGB 8.5* 8.1* 7.7*  HCT 26.1* 25.8* 24.7*  MCV 95.3 95.6 96.9  MCH 31.0 30.0 30.2  MCHC 32.6 31.4 31.2  RDW 14.9 14.6 14.5  PLT 198 210 225   Thyroid No results for input(s): "TSH", "FREET4" in the last 168 hours.  BNP Recent Labs  Lab 04/04/22 0435  BNP 985.2*    DDimer No results for input(s): "DDIMER" in the last 168 hours.   Radiology/Studies:  DG Chest Port 1 View  Result Date: 04/05/2022 CLINICAL DATA:  Hypoxia EXAM: PORTABLE CHEST 1 VIEW COMPARISON:  Chest x-ray dated April 01, 2022 FINDINGS: Interval placement of right central venous line with tip positioned over the expected area of the upper right atrium. Cardiac and mediastinal contours are unchanged. Similar lower  lung predominant heterogeneous opacities. No large pleural effusion or evidence of pneumothorax. IMPRESSION: 1. Interval placement of right central venous line with tip positioned over the expected area of the upper right atrium. 2. Similar lower lung predominant heterogeneous opacities which are likely due to pulmonary edema. Electronically Signed   By: Yetta Glassman M.D.   On: 04/05/2022 08:27   IR Fluoro Guide CV Line Right  Result Date: 04/03/2022 INDICATION: Poor venous access. Chronic renal insufficiency. In need of durable intravenous access for antibiotic administration. EXAM: ULTRASOUND AND FLUOROSCOPIC GUIDED PLACEMENT OF TUNNELED CENTRAL VENOUS CATHETER MEDICATIONS: None ANESTHESIA/SEDATION: None FLUOROSCOPY TIME:  18 seconds (8 mGy) COMPLICATIONS: None immediate. PROCEDURE: Informed written consent was obtained from the patient after a discussion of the risks, benefits, and alternatives to treatment. Questions regarding the procedure were encouraged and answered. The right neck and chest were prepped with chlorhexidine in a sterile fashion, and a sterile drape was applied covering the operative field. Maximum barrier sterile technique with sterile gowns and gloves were used for the procedure. A timeout was performed prior to the initiation of the procedure. After the overlying soft tissues were anesthetized, a small venotomy incision was created and a micropuncture kit was utilized to access the internal jugular vein. Real-time ultrasound guidance was utilized for vascular access including the acquisition of a permanent ultrasound image documenting patency of the accessed vessel. The microwire was utilized to measure appropriate catheter length. The micropuncture sheath was exchanged for a peel-away sheath over a guidewire. A 5 [place] dual lumen tunneled central venous catheter measuring 23 cm was tunneled in a retrograde fashion from the anterior chest wall to the venotomy incision. The catheter  was then placed through the peel-away sheath with tip ultimately positioned at the superior caval-atrial junction. Final catheter positioning was confirmed and documented with a spot radiographic image. The catheter aspirates and flushes normally. The catheter was flushed with appropriate volume heparin dwells. The catheter exit site was secured with a 0-Prolene retention suture. The venotomy incision was closed with Dermabond and Steri-strips. Dressings were applied. The patient tolerated the procedure well without immediate post procedural complication. FINDINGS: After catheter placement, the tip lies within the superior cavoatrial junction. The catheter aspirates and flushes normally and is ready for immediate use. IMPRESSION: Successful placement of 23 cm dual lumen tunneled central venous catheter via  the right internal jugular vein with tip terminating at the superior caval atrial junction. The catheter is ready for immediate use. Electronically Signed   By: Sandi Mariscal M.D.   On: 04/03/2022 10:05   IR US Guide Vasc Access Right  Result Date: 04/03/2022 INDICATION: Poor venous access. Chronic renal insufficiency. In need of durable intravenous access for antibiotic administration. EXAM: ULTRASOUND AND FLUOROSCOPIC GUIDED PLACEMENT OF TUNNELED CENTRAL VENOUS CATHETER MEDICATIONS: None ANESTHESIA/SEDATION: None FLUOROSCOPY TIME:  18 seconds (8 mGy) COMPLICATIONS: None immediate. PROCEDURE: Informed written consent was obtained from the patient after a discussion of the risks, benefits, and alternatives to treatment. Questions regarding the procedure were encouraged and answered. The right neck and chest were prepped with chlorhexidine in a sterile fashion, and a sterile drape was applied covering the operative field. Maximum barrier sterile technique with sterile gowns and gloves were used for the procedure. A timeout was performed prior to the initiation of the procedure. After the overlying soft tissues  were anesthetized, a small venotomy incision was created and a micropuncture kit was utilized to access the internal jugular vein. Real-time ultrasound guidance was utilized for vascular access including the acquisition of a permanent ultrasound image documenting patency of the accessed vessel. The microwire was utilized to measure appropriate catheter length. The micropuncture sheath was exchanged for a peel-away sheath over a guidewire. A 5 [place] dual lumen tunneled central venous catheter measuring 23 cm was tunneled in a retrograde fashion from the anterior chest wall to the venotomy incision. The catheter was then placed through the peel-away sheath with tip ultimately positioned at the superior caval-atrial junction. Final catheter positioning was confirmed and documented with a spot radiographic image. The catheter aspirates and flushes normally. The catheter was flushed with appropriate volume heparin dwells. The catheter exit site was secured with a 0-Prolene retention suture. The venotomy incision was closed with Dermabond and Steri-strips. Dressings were applied. The patient tolerated the procedure well without immediate post procedural complication. FINDINGS: After catheter placement, the tip lies within the superior cavoatrial junction. The catheter aspirates and flushes normally and is ready for immediate use. IMPRESSION: Successful placement of 23 cm dual lumen tunneled central venous catheter via the right internal jugular vein with tip terminating at the superior caval atrial junction. The catheter is ready for immediate use. Electronically Signed   By: Sandi Mariscal M.D.   On: 04/03/2022 10:05   DG CHEST PORT 1 VIEW  Result Date: 04/01/2022 CLINICAL DATA:  Shortness of breath EXAM: PORTABLE CHEST 1 VIEW COMPARISON:  Chest radiograph dated 05/11/2020 FINDINGS: The heart is enlarged. Vascular calcifications are seen in the aortic arch. Moderate bilateral lower lung predominant interstitial  opacities are noted. There is no pleural effusion or pneumothorax. Degenerative changes are seen in the spine. IMPRESSION: Moderate bilateral lower lung predominant interstitial opacities, which may represent pulmonary edema or atypical infection. Electronically Signed   By: Zerita Boers M.D.   On: 04/01/2022 19:15     Assessment and Plan:   Eric Scott is a 71 y.o. male with a hx of HTN, HFpEF, COPD onn 3L chronically,  CKD IV, DM, and anemia who is being seen 04/05/2022 for the evaluation of atrial fibrillation RVR at the request of Dr. Waldron Labs.  New onset atrial fibrillation with RVR -- Developed in the setting of osteomyelitis, CHF.  Episode 11/2 lasting from 3:47 AM until 10:20 AM with conversion back to sinus rhythm. -- given the short duration of afib, would continue metoprolol 57m BID  as he is mostly bradycardic at baseline -- given his worsening anemia, would not anticoagulate at this time, thought he does have an elevated Chadvasc score and would revisit it future episodes with stable Hgb -- consider addition of amiodarone to prevent recurrence   Osteomyelitis S/p hardware removal -- Infectious disease following -- Plan for 6 weeks of IV daptomycin and meropenem  Acute on chronic hypoxic respiratory failure HFpEF COPD HCAP? -- echo 12/2019 with LVEF 55-60%, indeterminate diastolic function -- CXR with pleural effusions -- UOP 1.6L, no daily weights recorded  -- baseline O2 requirements of 3L [place], did require Bipap but now on HFNC _0  -- transitioned from torsemide 48m BID PO to IV lasix 674mBID per primary, will increase lasix to at least 8070mID given his renal function/prior torsemide dosing  CKD IV -- follows with nephrology as an outpatient  -- baseline Cr around 3-3.5 -- follow BMET with need for diuresis  Anemia -- on chronic disease with baseline Hgb of 10-11, dropped 8.5>>8.1>>7.7 this  morning -- no reports of bleeding -- management per primary  Risk  Assessment/Risk Scores:   New York Heart Association (NYHA) Functional Class NYHA Class III  CHA2DS2-VASc Score = 5  This indicates a 7.2% annual risk of stroke. The patient's score is based upon: CHF History: 1 HTN History: 1 Diabetes History: 1 Stroke History: 0 Vascular Disease History: 1 Age Score: 1 Gender Score: 0    For questions or updates, please contact [place] consult [email or web address] for contact info under    Signed, LinReino BellisP  04/05/2022 11:29 AM  Patient examined chart reviewed Discussed care with patient and PA. Exam with chronically ill obese male. COPD on oxygen. SEM benign abdomen New right subclavian double lumen catheter LLE wrapped with drain from recent debridement surgery and wound vac. He had a brief episode of PAF. He is quite anemic and just had surgery Currently in NSR Agree with not anticoagulating now unless he has longer recurrence. He has been living in NH for 9 years and is essentially wheel chair bound. Will start amiodarone to try and prevent recurrence

## 2022-04-06 DIAGNOSIS — N184 Chronic kidney disease, stage 4 (severe): Secondary | ICD-10-CM | POA: Diagnosis not present

## 2022-04-06 DIAGNOSIS — J9621 Acute and chronic respiratory failure with hypoxia: Secondary | ICD-10-CM | POA: Diagnosis not present

## 2022-04-06 DIAGNOSIS — S81802S Unspecified open wound, left lower leg, sequela: Secondary | ICD-10-CM | POA: Diagnosis not present

## 2022-04-06 DIAGNOSIS — I480 Paroxysmal atrial fibrillation: Secondary | ICD-10-CM | POA: Diagnosis not present

## 2022-04-06 LAB — TROPONIN I (HIGH SENSITIVITY)
Troponin I (High Sensitivity): 13 ng/L (ref <18)
Troponin I (High Sensitivity): 12 ng/L (ref <18)

## 2022-04-06 LAB — CBC
HCT: 24.1 % — ABNORMAL LOW (ref 39.0–52.0)
Hemoglobin: 8.1 g/dL — ABNORMAL LOW (ref 13.0–17.0)
MCH: 31.5 pg (ref 26.0–34.0)
MCHC: 33.6 g/dL (ref 30.0–36.0)
MCV: 93.8 fL (ref 80.0–100.0)
Platelets: 234 10*3/uL (ref 150–400)
RBC: 2.57 MIL/uL — ABNORMAL LOW (ref 4.22–5.81)
RDW: 14.5 % (ref 11.5–15.5)
WBC: 9.7 10*3/uL (ref 4.0–10.5)
nRBC: 0 % (ref 0.0–0.2)

## 2022-04-06 LAB — BASIC METABOLIC PANEL
Anion gap: 11 (ref 5–15)
BUN: 60 mg/dL — ABNORMAL HIGH (ref 8–23)
CO2: 23 mmol/L (ref 22–32)
Calcium: 8.3 mg/dL — ABNORMAL LOW (ref 8.9–10.3)
Chloride: 107 mmol/L (ref 98–111)
Creatinine, Ser: 3.02 mg/dL — ABNORMAL HIGH (ref 0.61–1.24)
GFR, Estimated: 21 mL/min — ABNORMAL LOW (ref >60)
Glucose, Bld: 137 mg/dL — ABNORMAL HIGH (ref 70–99)
Potassium: 3.7 mmol/L (ref 3.5–5.1)
Sodium: 141 mmol/L (ref 135–145)

## 2022-04-06 LAB — GLUCOSE, CAPILLARY
Glucose-Capillary: 125 mg/dL — ABNORMAL HIGH (ref 70–99)
Glucose-Capillary: 195 mg/dL — ABNORMAL HIGH (ref 70–99)
Glucose-Capillary: 202 mg/dL — ABNORMAL HIGH (ref 70–99)
Glucose-Capillary: 218 mg/dL — ABNORMAL HIGH (ref 70–99)

## 2022-04-06 LAB — IRON AND TIBC
Iron: 29 ug/dL — ABNORMAL LOW (ref 45–182)
Saturation Ratios: 16 % — ABNORMAL LOW (ref 17.9–39.5)
TIBC: 178 ug/dL — ABNORMAL LOW (ref 250–450)
UIBC: 149 ug/dL

## 2022-04-06 LAB — FERRITIN: Ferritin: 254 ng/mL (ref 24–336)

## 2022-04-06 LAB — RETICULOCYTES
Immature Retic Fract: 19.1 % — ABNORMAL HIGH (ref 2.3–15.9)
RBC.: 2.69 MIL/uL — ABNORMAL LOW (ref 4.22–5.81)
Retic Count, Absolute: 62.9 10*3/uL (ref 19.0–186.0)
Retic Ct Pct: 2.3 % (ref 0.4–3.1)

## 2022-04-06 LAB — VITAMIN B12: Vitamin B-12: 413 pg/mL (ref 180–914)

## 2022-04-06 LAB — CK: Total CK: 26 U/L — ABNORMAL LOW (ref 49–397)

## 2022-04-06 LAB — FOLATE: Folate: 7.6 ng/mL (ref >5.9)

## 2022-04-06 MED ORDER — POTASSIUM CHLORIDE CRYS ER 20 MEQ PO TBCR
40.0000 meq | EXTENDED_RELEASE_TABLET | Freq: Once | ORAL | Status: AC
  Administered 2022-04-06: 40 meq via ORAL
  Filled 2022-04-06: qty 2

## 2022-04-06 MED ORDER — NITROGLYCERIN 0.4 MG SL SUBL
0.4000 mg | SUBLINGUAL_TABLET | SUBLINGUAL | Status: DC | PRN
  Administered 2022-04-06: 0.4 mg via SUBLINGUAL
  Filled 2022-04-06: qty 1

## 2022-04-06 MED ORDER — METHYLPREDNISOLONE SODIUM SUCC 125 MG IJ SOLR
80.0000 mg | Freq: Every day | INTRAMUSCULAR | Status: DC
  Administered 2022-04-06 – 2022-04-08 (×3): 80 mg via INTRAVENOUS
  Filled 2022-04-06 (×3): qty 2

## 2022-04-06 MED ORDER — ENSURE ENLIVE PO LIQD
237.0000 mL | Freq: Three times a day (TID) | ORAL | Status: DC
  Administered 2022-04-06 – 2022-04-11 (×15): 237 mL via ORAL

## 2022-04-06 MED ORDER — AMIODARONE HCL 200 MG PO TABS
200.0000 mg | ORAL_TABLET | Freq: Two times a day (BID) | ORAL | Status: DC
  Administered 2022-04-06 – 2022-04-11 (×11): 200 mg via ORAL
  Filled 2022-04-06 (×11): qty 1

## 2022-04-06 MED ORDER — PANTOPRAZOLE SODIUM 40 MG PO TBEC
40.0000 mg | DELAYED_RELEASE_TABLET | Freq: Every day | ORAL | Status: DC
  Administered 2022-04-06 – 2022-04-11 (×6): 40 mg via ORAL
  Filled 2022-04-06 (×6): qty 1

## 2022-04-06 MED ORDER — ORAL CARE MOUTH RINSE: 15.0000 mL | OROMUCOSAL | Status: DC | PRN

## 2022-04-06 NOTE — Progress Notes (Signed)
Mobility Specialist Progress Note    04/06/22 1414  Mobility  Activity Contraindicated/medical hold   RN advised. Will f/u as appropriate.   Hildred Alamin Mobility Specialist  Secure Chat Only

## 2022-04-06 NOTE — Progress Notes (Addendum)
   Notified that patient was complaining of chest pain. EKG without significant changes compared to yesterday EKG. Troponin pending. Patient was given SL nitro.   On my interview, patient reports feeling like a "300 lb man is sitting on his chest". Pain is located in the LUQ of the abdomen. Pain is reproduced on palpation. Pain was not improved with nitro. Patient reports that he has not been eating much today and has a decreased appetite.   We will follow up troponin.   Margie Billet, PA-C 04/06/2022 2:39 PM   ADDENDUM-- Initial hsTn 13

## 2022-04-06 NOTE — Progress Notes (Signed)
PROGRESS NOTE    Eric Scott  XNT:[id number] DOB: [birth year] DOA: 03/28/2022 PCP: [name], MD    No chief complaint on file.   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 Sharol Given after referral from PCP and had exposed hardware He was admitted by Dr. Due to 10/25 underwent left ankle hardware removal with partial distal fibular excision application of 95 cm Kerecis micro powder and local tissue arrangement 15 X3   ID saw the patient for antibiotic recommendations   Postoperatively he was planned for discharge but then developed shortness of breath on 10/29 and hospitalist service was contacted He was placed on BiPAP sent to stepdown unit    Assessment & Plan:   Principal Problem:   Leg wound, left, sequela Active Problems:   Acute on chronic respiratory failure with hypoxia ([name])   Chronic pain (back, legs)   Stage 4 chronic kidney disease ([place])   Type 2 diabetes mellitus with stage 4 chronic kidney disease, with [place]-term current use of insulin ([name])   HTN (hypertension)   Mood disorder ([name])   PAF (paroxysmal atrial fibrillation) ([name])   Leg wound, left, 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 -Line was placed 10/31 .   Acute on chronic respiratory failure with hypoxia ([name]) -Patient with COPD, on 3L home O2 -Continue home Breo, Duonebs -Multiple opacities  on imaging most likely related to volume overload than actual infection given no leukocytosis, no fever but he is empirically covered with IV antibiotics for his osteomyelitis. -Remains with increased oxygen requirement . -Was encouraged to use incentive spirometer . -Still requiring BiPAP as needed he did require BiPAP overnight.   . -Repeat x-ray still showing opacities likely to volume overload, will change his torsemide 80 mg twice daily to IV Lasix 60 mg IV twice daily, continue to monitor renal function closely.   Atrial fibrillation, new diagnosis  -Cardiology input greatly appreciated , . -At this point anticoagulation risks felt to outweigh benefits.  Acute on chronic diastolic CHF -Volume overload on imaging, and significant oxygen requirement. -Continue with IV Lasix, monitor his ins and outs closely.    Chronic pain (back, legs) -He has chronic pain in addition to acute pain -Cannot review PDMP since he is in a SNF -Continue Norco, Neurontin or as per orthopedics   Stage 4 chronic kidney disease ([place]) -Appears to be stable at this time -Attempt to avoid nephrotoxic medications -Recheck BMP periodically -Continue calcitriol -Continue colchicine for gout   Mood disorder ([place]) -Continue fluoxetine, alprazolam   HTN (hypertension) -Continue home amlodipine, metoprolol   Type 2 diabetes mellitus with stage 4 chronic kidney disease, with [place]-term current use of insulin ([name]) -A1c is 5.8, indicating good control -Continue glargine -Cover with moderate-scale SSI  Anemia of chronic kidney disease -Will obtain anemia panel, will give IV iron if ferritin is low, and if normal then will give Procrit.  COPD -Some minimal wheezing  today, will start on low-dose steroids and nebs treatment     DVT prophylaxis: [place]  Code Status: (Full) Family Communication: None at bedside  Status is: Inpatient  Consultants:  Ortho >> [facility]    Subjective:  Did require  BiPAP overnight, he remains on 7 L nasal cannula.  Objective: Vitals:   04/06/22 0732 04/06/22 0803 04/06/22 0809 04/06/22 0849  BP: (!) 153/85 (!) 153/85    Pulse: 69 63    Resp: 15 14    Temp:    98 F (36.7 C)  TempSrc:    Oral  SpO2: 100% 100% 98%   Weight:      Height:        Intake/Output Summary (Last 24 hours) at 04/06/2022 1100 Last data filed at 04/06/2022 0902 Gross per 24 hour  Intake 420 ml  Output 1600 ml  Net -1180 ml   Filed Weights   03/28/22 0641 04/06/22 0638  Weight: 115.7 kg 126.7 kg    Examination:    Awake Alert, frail, deconditioned, chronically ill-appearing Symmetrical Chest wall movement, has crackles at the bases with scattered wheezing RRR,No Gallops,Rubs or new Murmurs, No Parasternal Heave +ve B.Sounds, Abd Soft, No tenderness, No rebound - guarding or rigidity. No Cyanosis, Clubbing or edema, No new Rash or bruise    No Cyanosis, Clubbing ,trace  edema, foot bandaged and connected to wound VAC   Data Reviewed: I have personally reviewed following labs and imaging studies  CBC: Recent Labs  Lab 04/02/22 0018 04/04/22 0435 04/05/22 0341 04/06/22 0636  WBC 9.5 8.3 7.7 9.7  HGB 8.5* 8.1* 7.7* 8.1*  HCT 26.1* 25.8* 24.7* 24.1*  MCV 95.3 95.6 96.9 93.8  PLT 198 210 225 195    Basic Metabolic Panel: Recent Labs  Lab 03/31/22 0545 04/02/22 0018 04/04/22 0435 04/05/22 0341 04/06/22 0636  NA 145 143 140 143 141  K 3.7 3.7 3.8 4.0 3.7  CL 113* 110 107 110 107  CO2 21* 22 21* 23 23  GLUCOSE 101* 194* 124* 111* 137*  BUN 57* 57* 64* 61* 60*  CREATININE 3.61* 3.40* 3.29* 3.14* 3.02*  CALCIUM 8.7* 8.6* 8.5* 8.5* 8.3*    GFR: Estimated Creatinine Clearance: 30.8 mL/min (A) (by C-G formula based on SCr of 3.02 mg/dL (H)).  Liver Function Tests: No results for input(s): "AST", "ALT", "ALKPHOS", "BILITOT", "PROT", "ALBUMIN" in the last 168 hours.  CBG: Recent Labs  Lab 04/05/22 0609 04/05/22 1109 04/05/22 1634  04/05/22 2140 04/06/22 0635  GLUCAP 113* 119* 154* 144* 125*     Recent Results (from the past 240 hour(s))  Aerobic/Anaerobic Culture w Gram Stain (surgical/deep wound)     Status: None   Collection Time: 03/28/22  9:30 AM   Specimen: Wound; Tissue  Result Value Ref Range Status   Specimen Description WOUND  Final   Special Requests ANKLE  Final   Gram Stain NO WBC SEEN NO ORGANISMS SEEN   Final   Culture   Final    RARE ACINETOBACTER CALCOACETICUS/BAUMANNII COMPLEX RARE MORGANELLA MORGANII RARE ENTEROCOCCUS FAECALIS RARE METHICILLIN RESISTANT STAPHYLOCOCCUS AUREUS VANCOMYCIN RESISTANT ENTEROCOCCUS ISOLATED NO ANAEROBES ISOLATED Performed at [facility], [address] [place], [place] [address]    Report Status 04/02/2022 FINAL  Final   Organism ID, Bacteria ACINETOBACTER CALCOACETICUS/BAUMANNII COMPLEX  Final   Organism ID, Bacteria MORGANELLA MORGANII  Final   Organism ID, Bacteria ENTEROCOCCUS FAECALIS  Final   Organism ID, Bacteria METHICILLIN RESISTANT STAPHYLOCOCCUS AUREUS  Final  Susceptibility   Acinetobacter calcoaceticus/baumannii complex - MIC*    CEFTAZIDIME >=64 RESISTANT Resistant     CIPROFLOXACIN >=4 RESISTANT Resistant     GENTAMICIN <=1 SENSITIVE Sensitive     IMIPENEM 0.5 SENSITIVE Sensitive     PIP/TAZO >=128 RESISTANT Resistant     TRIMETH/SULFA <=20 SENSITIVE Sensitive     AMPICILLIN/SULBACTAM <=2 SENSITIVE Sensitive     * RARE ACINETOBACTER CALCOACETICUS/BAUMANNII COMPLEX   Enterococcus faecalis - MIC*    AMPICILLIN <=2 SENSITIVE Sensitive     VANCOMYCIN >=32 RESISTANT Resistant     GENTAMICIN SYNERGY SENSITIVE Sensitive     LINEZOLID 2 SENSITIVE Sensitive     * RARE ENTEROCOCCUS FAECALIS   Morganella morganii - MIC*    AMPICILLIN >=32 RESISTANT Resistant     CEFAZOLIN >=64 RESISTANT Resistant     CEFTAZIDIME 32 RESISTANT Resistant     CIPROFLOXACIN 2 RESISTANT Resistant     GENTAMICIN <=1 SENSITIVE Sensitive     IMIPENEM 4  SENSITIVE Sensitive     TRIMETH/SULFA >=320 RESISTANT Resistant     AMPICILLIN/SULBACTAM >=32 RESISTANT Resistant     PIP/TAZO <=4 SENSITIVE Sensitive     * RARE MORGANELLA MORGANII   Methicillin resistant staphylococcus aureus - MIC*    CIPROFLOXACIN >=8 RESISTANT Resistant     ERYTHROMYCIN >=8 RESISTANT Resistant     GENTAMICIN <=0.5 SENSITIVE Sensitive     OXACILLIN >=4 RESISTANT Resistant     TETRACYCLINE <=1 SENSITIVE Sensitive     VANCOMYCIN 1 SENSITIVE Sensitive     TRIMETH/SULFA <=10 SENSITIVE Sensitive     CLINDAMYCIN <=0.25 SENSITIVE Sensitive     RIFAMPIN <=0.5 SENSITIVE Sensitive     Inducible Clindamycin NEGATIVE Sensitive     * RARE METHICILLIN RESISTANT STAPHYLOCOCCUS AUREUS         Radiology Studies: ECHOCARDIOGRAM COMPLETE  Result Date: 04/05/2022    ECHOCARDIOGRAM REPORT   Patient Name:   KYUSS HALE Grimmett Date of Exam: 04/05/2022 Medical Rec #:  [id number]      Height:       72.0 in Accession #:    [id number]     Weight:       255.0 lb Date of Birth:  1950/10/17      BSA:          2.362 m Patient Age:    89 years       BP:           147/66 mmHg Patient Gender: M              HR:           65 bpm. Exam Location:  Inpatient Procedure: 2D Echo, Color Doppler, Cardiac Doppler and Intracardiac            Opacification Agent Indications:    Atrial Fibrillation I48.91  History:        Patient has prior history of Echocardiogram examinations, most                 recent 12/19/2019. Stroke and COPD; Risk Factors:Hypertension,                 Diabetes, Dyslipidemia and Former Smoker. Chronic kidney                 disease.  Sonographer:    Darlina Sicilian RDCS Referring Phys: [place]  1. Left ventricular ejection fraction, by estimation, is 60 to 65%. The left ventricle has normal function. The left ventricle has no  regional wall motion abnormalities. The left ventricular internal cavity size was mildly dilated. There is moderate concentric left ventricular  hypertrophy. Left ventricular diastolic parameters are indeterminate. Elevated left ventricular end-diastolic pressure.  2. Right ventricular systolic function is normal. The right ventricular size is moderately enlarged. There is moderately elevated pulmonary artery systolic pressure. The estimated right ventricular systolic pressure is 97.3 mmHg.  3. The mitral valve is normal in structure. Trivial mitral valve regurgitation. No evidence of mitral stenosis.  4. The aortic valve is tricuspid. Aortic valve regurgitation is trivial. Aortic valve sclerosis is present, with no evidence of aortic valve stenosis.  5. The inferior vena cava is dilated in size with >50% respiratory variability, suggesting right atrial pressure of 8 mmHg. FINDINGS  Left Ventricle: Left ventricular ejection fraction, by estimation, is 60 to 65%. The left ventricle has normal function. The left ventricle has no regional wall motion abnormalities. Definity contrast agent was given IV to delineate the left ventricular  endocardial borders. The left ventricular internal cavity size was mildly dilated. There is moderate concentric left ventricular hypertrophy. Left ventricular diastolic parameters are indeterminate. Elevated left ventricular end-diastolic pressure. Right Ventricle: The right ventricular size is moderately enlarged. No increase in right ventricular wall thickness. Right ventricular systolic function is normal. There is moderately elevated pulmonary artery systolic pressure. The tricuspid regurgitant  velocity is 3.09 m/s, and with an assumed right atrial pressure of 8 mmHg, the estimated right ventricular systolic pressure is 53.2 mmHg. Left Atrium: Left atrial size was normal in size. Right Atrium: Right atrial size was normal in size. Pericardium: There is no evidence of pericardial effusion. Mitral Valve: The mitral valve is normal in structure. Trivial mitral valve regurgitation. No evidence of mitral valve stenosis. MV peak  gradient, 6.2 mmHg. Tricuspid Valve: The tricuspid valve is normal in structure. Tricuspid valve regurgitation is mild . No evidence of tricuspid stenosis. Aortic Valve: The aortic valve is tricuspid. Aortic valve regurgitation is trivial. Aortic valve sclerosis is present, with no evidence of aortic valve stenosis. Pulmonic Valve: The pulmonic valve was normal in structure. Pulmonic valve regurgitation is trivial. No evidence of pulmonic stenosis. Aorta: The aortic root is normal in size and structure. Venous: The inferior vena cava is dilated in size with greater than 50% respiratory variability, suggesting right atrial pressure of 8 mmHg. IAS/Shunts: No atrial level shunt detected by color flow Doppler.  LEFT VENTRICLE PLAX 2D LVIDd:         5.15 cm      Diastology LVIDs:         3.55 cm      LV e' medial:    3.99 cm/s LV PW:         1.35 cm      LV E/e' medial:  30.3 LV IVS:        1.50 cm      LV e' lateral:   10.40 cm/s LVOT diam:     2.20 cm      LV E/e' lateral: 11.6 LV SV:         96 LV SV Index:   41 LVOT Area:     3.80 cm  LV Volumes (MOD) LV vol d, MOD A2C: 233.0 ml LV vol d, MOD A4C: 192.0 ml LV vol s, MOD A2C: 70.6 ml LV vol s, MOD A4C: 72.1 ml LV SV MOD A2C:     162.4 ml LV SV MOD A4C:     192.0 ml LV SV MOD BP:  140.6 ml RIGHT VENTRICLE RV Basal diam:  5.20 cm RV Mid diam:    4.10 cm RV S prime:     8.55 cm/s TAPSE (M-mode): 1.9 cm LEFT ATRIUM             Index        RIGHT ATRIUM           Index LA diam:        4.20 cm 1.78 cm/m   RA Area:     15.60 cm LA Vol (A2C):   79.0 ml 33.44 ml/m  RA Volume:   31.20 ml  13.21 ml/m LA Vol (A4C):   51.8 ml 21.93 ml/m LA Biplane Vol: 67.4 ml 28.53 ml/m  AORTIC VALVE LVOT Vmax:   117.00 cm/s LVOT Vmean:  75.400 cm/s LVOT VTI:    0.252 m  AORTA Ao Root diam: 3.30 cm Ao Asc diam:  3.40 cm MITRAL VALVE                TRICUSPID VALVE MV Area (PHT): 4.86 cm     TR Peak grad:   38.2 mmHg MV Peak grad:  6.2 mmHg     TR Vmax:        309.00 cm/s MV Vmax:        1.24 m/s MV Decel Time: 156 msec     SHUNTS MV E velocity: 121.00 cm/s  Systemic VTI:  0.25 m MV A velocity: 71.80 cm/s   Systemic Diam: 2.20 cm MV E/A ratio:  1.69 Fransico Him MD Electronically signed by Fransico Him MD Signature Date/Time: 04/05/2022/2:11:52 PM    Final    DG Chest Port 1 View  Result Date: 04/05/2022 CLINICAL DATA:  Hypoxia EXAM: PORTABLE CHEST 1 VIEW COMPARISON:  Chest x-ray dated April 01, 2022 FINDINGS: Interval placement of right central venous line with tip positioned over the expected area of the upper right atrium. Cardiac and mediastinal contours are unchanged. Similar lower lung predominant heterogeneous opacities. No large pleural effusion or evidence of pneumothorax. IMPRESSION: 1. Interval placement of right central venous line with tip positioned over the expected area of the upper right atrium. 2. Similar lower lung predominant heterogeneous opacities which are likely due to pulmonary edema. Electronically Signed   By: Yetta Glassman M.D.   On: 04/05/2022 08:27        Scheduled Meds:  ALPRAZolam  0.5 mg Oral QHS   amiodarone  200 mg Oral BID   amLODipine  10 mg Oral Daily   aspirin EC  81 mg Oral Q breakfast   calcitRIOL  0.25 mcg Oral Daily   Chlorhexidine Gluconate Cloth  6 each Topical Daily   colchicine  0.6 mg Oral Daily   docusate sodium  100 mg Oral BID   feeding supplement  237 mL Oral TID BM   FLUoxetine  40 mg Oral Daily   fluticasone furoate-vilanterol  1 puff Inhalation Daily   furosemide  80 mg Intravenous BID   gabapentin  300 mg Oral QHS   heparin injection (subcutaneous)  5,000 Units Subcutaneous Q8H   insulin aspart  0-15 Units Subcutaneous TID WC   insulin aspart  4 Units Subcutaneous TID WC   insulin glargine-yfgn  10 Units Subcutaneous QHS   ipratropium-albuterol  3 mL Nebulization Q6H   metoprolol tartrate  50 mg Oral BID   tamsulosin  0.4 mg Oral QPC supper   Continuous Infusions:  sodium chloride Stopped (04/01/22 0827)    DAPTOmycin (CUBICIN) 750 mg in  sodium chloride 0.9 % IVPB 750 mg (04/05/22 1812)   meropenem (MERREM) IV 500 mg (04/06/22 0453)   methocarbamol (ROBAXIN) IV       LOS: 8 days       Phillips Climes, MD [facility]   To contact the attending provider between 7A-7P or the covering provider during after hours 7P-7A, please log into the web site [email or web address] and access using universal [place] password for that web site. If you do not have the password, please call the hospital operator.  04/06/2022, 11:00 AM

## 2022-04-06 NOTE — Progress Notes (Signed)
Patient reports chest pain at 1300. Initially he asks, "I am wondering if I am having a stroke or something." I asked him why he would say that, and he reports mid sternal chest pain and heaviness, does appear mildly distress.  EKG at bedside done. Paged MD. Continue to monitor.

## 2022-04-06 NOTE — TOC Progression Note (Signed)
Transition of Care [facility]) - Progression Note    Patient Details  Name: Eric Scott MRN: [id number] Date of Birth: Jul 16, 1950  Transition of [facility] CM/SW [place], LCSW Phone Number: 04/06/2022, 8:58 AM  Clinical Narrative:    CSW continuing to follow.    Expected Discharge Plan: [place] Barriers to Discharge: Continued Medical Work up  Expected Discharge Plan and Services Expected Discharge Plan: [place] In-house Referral: Clinical Social Work   Post Acute Care Choice: Resumption of Svcs/PTA Provider Living arrangements for the past 2 months: [place]                                       Social Determinants of Health (SDOH) Interventions    Readmission Risk Interventions     No data to display

## 2022-04-06 NOTE — Progress Notes (Signed)
Brief Nutrition Note  Received message from pharmacy about pt's on going poor intake over the last several days. Reviewed intake and 40% average intake of meals this week. Will add ensure enlive TID to augment intake and encourage adequate intake of nutrition for recovery.   If further interventions are needed, please submit a nutrition consult and a full assessment can be completed.   Ranell Patrick, RD, LDN Clinical Dietitian RD pager # available in [place]  After hours/weekend pager # available in [facility]

## 2022-04-06 NOTE — Progress Notes (Signed)
Patient still having chest pain which is less than it was at 1300 but still present. Blood pressures 160-170s/60s. Nitro given.   Continue to monitor.

## 2022-04-06 NOTE — Plan of Care (Signed)
  Problem: Clinical Measurements: Goal: Respiratory complications will improve Outcome: Progressing Goal: Cardiovascular complication will be avoided Outcome: Progressing   Problem: Nutrition: Goal: Adequate nutrition will be maintained Outcome: Progressing   Problem: Elimination: Goal: Will not experience complications related to bowel motility Outcome: Progressing Goal: Will not experience complications related to urinary retention Outcome: Progressing   Problem: Pain Managment: Goal: General experience of comfort will improve Outcome: Progressing   Problem: Activity: Goal: Risk for activity intolerance will decrease Outcome: Not Progressing   Problem: Coping: Goal: Level of anxiety will decrease Outcome: Not Progressing

## 2022-04-06 NOTE — Progress Notes (Addendum)
Rounding Note    Patient Name: Eric Scott Date of Encounter: 04/06/2022  [place] Cardiologist: Jenkins Rouge, MD   Subjective   Patient reports that he is having a very hard time breathing this AM. Also reports cough. Maintaining NSR per telemetry   Inpatient Medications    Scheduled Meds:  ALPRAZolam  0.5 mg Oral QHS   amLODipine  10 mg Oral Daily   aspirin EC  81 mg Oral Q breakfast   calcitRIOL  0.25 mcg Oral Daily   Chlorhexidine Gluconate Cloth  6 each Topical Daily   colchicine  0.6 mg Oral Daily   docusate sodium  100 mg Oral BID   FLUoxetine  40 mg Oral Daily   fluticasone furoate-vilanterol  1 puff Inhalation Daily   furosemide  80 mg Intravenous BID   gabapentin  300 mg Oral QHS   heparin injection (subcutaneous)  5,000 Units Subcutaneous Q8H   insulin aspart  0-15 Units Subcutaneous TID WC   insulin aspart  4 Units Subcutaneous TID WC   insulin glargine-yfgn  10 Units Subcutaneous QHS   ipratropium-albuterol  3 mL Nebulization Q6H   metoprolol tartrate  50 mg Oral BID   tamsulosin  0.4 mg Oral QPC supper   Continuous Infusions:  sodium chloride Stopped (04/01/22 0827)   DAPTOmycin (CUBICIN) 750 mg in sodium chloride 0.9 % IVPB 750 mg (04/05/22 1812)   meropenem (MERREM) IV 500 mg (04/06/22 0453)   methocarbamol (ROBAXIN) IV     PRN Meds: acetaminophen, albuterol, bisacodyl, HYDROcodone-acetaminophen, HYDROcodone-acetaminophen, magnesium citrate, methocarbamol **OR** methocarbamol (ROBAXIN) IV, metoCLOPramide **OR** metoCLOPramide (REGLAN) injection, ondansetron **OR** ondansetron (ZOFRAN) IV, polyethylene glycol   Vital Signs    Vitals:   04/06/22 0210 04/06/22 0443 04/06/22 0638 04/06/22 0732  BP:  (!) 148/81  (!) 153/85  Pulse:  70  69  Resp:  16  15  Temp:  98.1 F (36.7 C)    TempSrc:  Oral    SpO2: 96% 100%  100%  Weight:   126.7 kg   Height:        Intake/Output Summary (Last 24 hours) at 04/06/2022 0806 Last data filed  at 04/05/2022 2345 Gross per 24 hour  Intake 240 ml  Output 1600 ml  Net -1360 ml      04/06/2022    6:38 AM 03/28/2022    6:41 AM 11/09/2020    1:21 PM  Last 3 Weights  Weight (lbs) 279 lb 5.2 oz 255 lb 279 lb 15.8 oz  Weight (kg) 126.7 kg 115.667 kg 127 kg      Telemetry    NSR - Personally Reviewed  ECG    No new tracings- Personally Reviewed  Physical Exam   GEN: No acute distress but appears uncomfortable. Sitting in the bed  Neck: No JVD Cardiac: RRR, SEM present.  Respiratory: Inspiratory and expiratory wheezing heard throughout. Crackles in bilateral lung bases. Wearing [place] with 5 L supplemental oxygen  GI: Soft, nontender, non-distended  MS: Trace edema in RLE. Left lower extremity covered in bandages   Neuro:  Nonfocal  Psych: Normal affect   Labs    High Sensitivity Troponin:  No results for input(s): "TROPONINIHS" in the last 720 hours.   Chemistry Recent Labs  Lab 04/04/22 0435 04/05/22 0341 04/06/22 0636  NA 140 143 141  K 3.8 4.0 3.7  CL 107 110 107  CO2 21* 23 23  GLUCOSE 124* 111* 137*  BUN 64* 61* 60*  CREATININE 3.29* 3.14*  3.02*  CALCIUM 8.5* 8.5* 8.3*  GFRNONAA 19* 20* 21*  ANIONGAP '12 10 11    '$ Lipids No results for input(s): "CHOL", "TRIG", "HDL", "LABVLDL", "LDLCALC", "CHOLHDL" in the last 168 hours.  Hematology Recent Labs  Lab 04/04/22 0435 04/05/22 0341 04/06/22 0636  WBC 8.3 7.7 9.7  RBC 2.70* 2.55* 2.57*  HGB 8.1* 7.7* 8.1*  HCT 25.8* 24.7* 24.1*  MCV 95.6 96.9 93.8  MCH 30.0 30.2 31.5  MCHC 31.4 31.2 33.6  RDW 14.6 14.5 14.5  PLT 210 225 234   Thyroid No results for input(s): "TSH", "FREET4" in the last 168 hours.  BNP Recent Labs  Lab 04/04/22 0435  BNP 985.2*    DDimer No results for input(s): "DDIMER" in the last 168 hours.   Radiology    ECHOCARDIOGRAM COMPLETE  Result Date: 04/05/2022    ECHOCARDIOGRAM REPORT   Patient Name:   Eric Scott Stirn Date of Exam: 04/05/2022 Medical Rec #:  [id number]      Height:        72.0 in Accession #:    [id number]     Weight:       255.0 lb Date of Birth:  10/28/1950      BSA:          2.362 m Patient Age:    71 years       BP:           147/66 mmHg Patient Gender: M              HR:           65 bpm. Exam Location:  Inpatient Procedure: 2D Echo, Color Doppler, Cardiac Doppler and Intracardiac            Opacification Agent Indications:    Atrial Fibrillation I48.91  History:        Patient has prior history of Echocardiogram examinations, most                 recent 12/19/2019. Stroke and COPD; Risk Factors:Hypertension,                 Diabetes, Dyslipidemia and Former Smoker. Chronic kidney                 disease.  Sonographer:    Darlina Sicilian RDCS Referring Phys: [place]  1. Left ventricular ejection fraction, by estimation, is 60 to 65%. The left ventricle has normal function. The left ventricle has no regional wall motion abnormalities. The left ventricular internal cavity size was mildly dilated. There is moderate concentric left ventricular hypertrophy. Left ventricular diastolic parameters are indeterminate. Elevated left ventricular end-diastolic pressure.  2. Right ventricular systolic function is normal. The right ventricular size is moderately enlarged. There is moderately elevated pulmonary artery systolic pressure. The estimated right ventricular systolic pressure is 82.9 mmHg.  3. The mitral valve is normal in structure. Trivial mitral valve regurgitation. No evidence of mitral stenosis.  4. The aortic valve is tricuspid. Aortic valve regurgitation is trivial. Aortic valve sclerosis is present, with no evidence of aortic valve stenosis.  5. The inferior vena cava is dilated in size with >50% respiratory variability, suggesting right atrial pressure of 8 mmHg. FINDINGS  Left Ventricle: Left ventricular ejection fraction, by estimation, is 60 to 65%. The left ventricle has normal function. The left ventricle has no regional wall motion  abnormalities. Definity contrast agent was given IV to delineate the left ventricular  endocardial borders. The left  ventricular internal cavity size was mildly dilated. There is moderate concentric left ventricular hypertrophy. Left ventricular diastolic parameters are indeterminate. Elevated left ventricular end-diastolic pressure. Right Ventricle: The right ventricular size is moderately enlarged. No increase in right ventricular wall thickness. Right ventricular systolic function is normal. There is moderately elevated pulmonary artery systolic pressure. The tricuspid regurgitant  velocity is 3.09 m/s, and with an assumed right atrial pressure of 8 mmHg, the estimated right ventricular systolic pressure is 37.9 mmHg. Left Atrium: Left atrial size was normal in size. Right Atrium: Right atrial size was normal in size. Pericardium: There is no evidence of pericardial effusion. Mitral Valve: The mitral valve is normal in structure. Trivial mitral valve regurgitation. No evidence of mitral valve stenosis. MV peak gradient, 6.2 mmHg. Tricuspid Valve: The tricuspid valve is normal in structure. Tricuspid valve regurgitation is mild . No evidence of tricuspid stenosis. Aortic Valve: The aortic valve is tricuspid. Aortic valve regurgitation is trivial. Aortic valve sclerosis is present, with no evidence of aortic valve stenosis. Pulmonic Valve: The pulmonic valve was normal in structure. Pulmonic valve regurgitation is trivial. No evidence of pulmonic stenosis. Aorta: The aortic root is normal in size and structure. Venous: The inferior vena cava is dilated in size with greater than 50% respiratory variability, suggesting right atrial pressure of 8 mmHg. IAS/Shunts: No atrial level shunt detected by color flow Doppler.  LEFT VENTRICLE PLAX 2D LVIDd:         5.15 cm      Diastology LVIDs:         3.55 cm      LV e' medial:    3.99 cm/s LV PW:         1.35 cm      LV E/e' medial:  30.3 LV IVS:        1.50 cm      LV e'  lateral:   10.40 cm/s LVOT diam:     2.20 cm      LV E/e' lateral: 11.6 LV SV:         96 LV SV Index:   41 LVOT Area:     3.80 cm  LV Volumes (MOD) LV vol d, MOD A2C: 233.0 ml LV vol d, MOD A4C: 192.0 ml LV vol s, MOD A2C: 70.6 ml LV vol s, MOD A4C: 72.1 ml LV SV MOD A2C:     162.4 ml LV SV MOD A4C:     192.0 ml LV SV MOD BP:      140.6 ml RIGHT VENTRICLE RV Basal diam:  5.20 cm RV Mid diam:    4.10 cm RV S prime:     8.55 cm/s TAPSE (M-mode): 1.9 cm LEFT ATRIUM             Index        RIGHT ATRIUM           Index LA diam:        4.20 cm 1.78 cm/m   RA Area:     15.60 cm LA Vol (A2C):   79.0 ml 33.44 ml/m  RA Volume:   31.20 ml  13.21 ml/m LA Vol (A4C):   51.8 ml 21.93 ml/m LA Biplane Vol: 67.4 ml 28.53 ml/m  AORTIC VALVE LVOT Vmax:   117.00 cm/s LVOT Vmean:  75.400 cm/s LVOT VTI:    0.252 m  AORTA Ao Root diam: 3.30 cm Ao Asc diam:  3.40 cm MITRAL VALVE  TRICUSPID VALVE MV Area (PHT): 4.86 cm     TR Peak grad:   38.2 mmHg MV Peak grad:  6.2 mmHg     TR Vmax:        309.00 cm/s MV Vmax:       1.24 m/s MV Decel Time: 156 msec     SHUNTS MV E velocity: 121.00 cm/s  Systemic VTI:  0.25 m MV A velocity: 71.80 cm/s   Systemic Diam: 2.20 cm MV E/A ratio:  1.69 Fransico Him MD Electronically signed by Fransico Him MD Signature Date/Time: 04/05/2022/2:11:52 PM    Final    DG Chest Port 1 View  Result Date: 04/05/2022 CLINICAL DATA:  Hypoxia EXAM: PORTABLE CHEST 1 VIEW COMPARISON:  Chest x-ray dated April 01, 2022 FINDINGS: Interval placement of right central venous line with tip positioned over the expected area of the upper right atrium. Cardiac and mediastinal contours are unchanged. Similar lower lung predominant heterogeneous opacities. No large pleural effusion or evidence of pneumothorax. IMPRESSION: 1. Interval placement of right central venous line with tip positioned over the expected area of the upper right atrium. 2. Similar lower lung predominant heterogeneous opacities which are  likely due to pulmonary edema. Electronically Signed   By: Yetta Glassman M.D.   On: 04/05/2022 08:27    Cardiac Studies   Echocardiogram 04/05/22 1. Left ventricular ejection fraction, by estimation, is 60 to 65%. The  left ventricle has normal function. The left ventricle has no regional  wall motion abnormalities. The left ventricular internal cavity size was  mildly dilated. There is moderate  concentric left ventricular hypertrophy. Left ventricular diastolic  parameters are indeterminate. Elevated left ventricular end-diastolic  pressure.   2. Right ventricular systolic function is normal. The right ventricular  size is moderately enlarged. There is moderately elevated pulmonary artery  systolic pressure. The estimated right ventricular systolic pressure is  61.6 mmHg.   3. The mitral valve is normal in structure. Trivial mitral valve  regurgitation. No evidence of mitral stenosis.   4. The aortic valve is tricuspid. Aortic valve regurgitation is trivial.  Aortic valve sclerosis is present, with no evidence of aortic valve  stenosis.   5. The inferior vena cava is dilated in size with >50% respiratory  variability, suggesting right atrial pressure of 8 mmHg.   Patient Profile     71 y.o. male with a hx of HTN, HFpEF, COPD onn 3L chronically,  CKD IV, DM, and anemia who is being seen 04/05/2022 for the evaluation of atrial fibrillation RVR at the request of Dr. Seward Meth   Assessment & Plan    New onset atrial fibrillation with RVR - Developed in the setting of osteomyelitis, CHF.  Episode 11/2 lasting from 3:47 AM until 10:20 AM with conversion back to sinus rhythm. Per telemetry, patient is maintaining NSR  - Will start PO amiodarone to help support NSR and to prevent recurrence of afib  - Continue metoprolol '50mg'$  BID  - CHADS-VASc 5, hemoglobin 8.1 this AM. Given his worsening anemia, would not anticoagulate at this time.  - Consider outpatient monitor at DC to see if  patient has recurrence of afib. May need anticoagulation in the further if he has recurrence of Afib and if hemoglobin stabilizes  Osteomyelitis S/p hardware removal - Infectious disease following - Plan for 6 weeks of IV daptomycin and meropenem   Acute on chronic hypoxic respiratory failure HFpEF COPD HCAP? - Echo this admission with EF 60-65%, moderate LVH, elevated LVEDP, normal RV  systolic function  - CXR 03/54 with moderate bilateral lower lung predominant interstitial opacities with may represent pulmonary edema vs atypical infection. CXR 11/2 with similar lower lung opacities that are likely due to pulmonary edema  - Patient was started on IV lasix 80 mg BID yesterday-- output 1.65 L urine yesteray and is currently net -1.7 L since admission. Weights incomplete - Renal function stable/improving. Creatinine was up to 3.78 on 10/25. Was 3.14 yesterday and is now 3.02  - baseline O2 requirements of 3L [place]. Intermittently requiring Bi-PAP. Patient did have an episode of SOB this AM and required Bi-PAP. Inspiratory and expiratory wheezing audible on exam as well as crackles in bilateral lung bases.  - Continue IV lasix. Patient has duo-neb treatments ordered and PRN albuterol. Also has incentive spirometer     CKD IV - follows with nephrology as an outpatient  - baseline Cr around 3-3.5 - follow BMET daily as patient is on diuretics    Anemia - on chronic disease with baseline Hgb of 10-11, was 7.7 yesterday. Slightly improved to 8.1 this AM   - no reports of bleeding - management per primary   For questions or updates, please contact [facility] Please consult [email or web address] for contact info under        Signed, Margie Billet, PA-C  04/06/2022, 8:06 AM    Patient examined chart reviewed Chronically ill mostly wheel chair bound male exp wheezing JVP ok No murmur abdomen benign LLE wrapped with wound vac. More anemic consider transfusion On Lasix with good  diuresis COPD with oxygen in NH oxygen and bipap as needed Cr improved Telemetry with NSR no further PAF No anticoagulation other than DVT given anemia and recent wound vac  Jenkins Rouge MD [facility]

## 2022-04-06 NOTE — Progress Notes (Signed)
Pt became anxious and is having sob and pulled [place] out of nose. Pt is stating he cannot breath and is hyperventilating at 90% o2 sat. BIPAP placed back on pt by RT. Pt is now calm and o2 sat is 100%.

## 2022-04-06 NOTE — Progress Notes (Signed)
Upon assessment pt was on BIPAP from previous panic attack and anxiety. Pt is currently stable and is on HFNC 7L. RT came and assessed pt and agreed HFNC is sufficient and pt has not under respiratory distress.

## 2022-04-07 DIAGNOSIS — N184 Chronic kidney disease, stage 4 (severe): Secondary | ICD-10-CM | POA: Diagnosis not present

## 2022-04-07 DIAGNOSIS — S81802S Unspecified open wound, left lower leg, sequela: Secondary | ICD-10-CM | POA: Diagnosis not present

## 2022-04-07 DIAGNOSIS — J9621 Acute and chronic respiratory failure with hypoxia: Secondary | ICD-10-CM | POA: Diagnosis not present

## 2022-04-07 DIAGNOSIS — I480 Paroxysmal atrial fibrillation: Secondary | ICD-10-CM | POA: Diagnosis not present

## 2022-04-07 LAB — HEMOGLOBIN AND HEMATOCRIT, BLOOD
HCT: 27.7 % — ABNORMAL LOW (ref 39.0–52.0)
Hemoglobin: 8.7 g/dL — ABNORMAL LOW (ref 13.0–17.0)

## 2022-04-07 LAB — GLUCOSE, CAPILLARY
Glucose-Capillary: 168 mg/dL — ABNORMAL HIGH (ref 70–99)
Glucose-Capillary: 174 mg/dL — ABNORMAL HIGH (ref 70–99)
Glucose-Capillary: 221 mg/dL — ABNORMAL HIGH (ref 70–99)
Glucose-Capillary: 252 mg/dL — ABNORMAL HIGH (ref 70–99)

## 2022-04-07 LAB — PREPARE RBC (CROSSMATCH)

## 2022-04-07 LAB — BASIC METABOLIC PANEL
Anion gap: 11 (ref 5–15)
BUN: 66 mg/dL — ABNORMAL HIGH (ref 8–23)
CO2: 23 mmol/L (ref 22–32)
Calcium: 8.9 mg/dL (ref 8.9–10.3)
Chloride: 110 mmol/L (ref 98–111)
Creatinine, Ser: 3.01 mg/dL — ABNORMAL HIGH (ref 0.61–1.24)
GFR, Estimated: 21 mL/min — ABNORMAL LOW (ref >60)
Glucose, Bld: 189 mg/dL — ABNORMAL HIGH (ref 70–99)
Potassium: 4.6 mmol/L (ref 3.5–5.1)
Sodium: 144 mmol/L (ref 135–145)

## 2022-04-07 LAB — CBC
HCT: 22.8 % — ABNORMAL LOW (ref 39.0–52.0)
Hemoglobin: 7.4 g/dL — ABNORMAL LOW (ref 13.0–17.0)
MCH: 30.6 pg (ref 26.0–34.0)
MCHC: 32.5 g/dL (ref 30.0–36.0)
MCV: 94.2 fL (ref 80.0–100.0)
Platelets: 236 10*3/uL (ref 150–400)
RBC: 2.42 MIL/uL — ABNORMAL LOW (ref 4.22–5.81)
RDW: 14.4 % (ref 11.5–15.5)
WBC: 6.2 10*3/uL (ref 4.0–10.5)
nRBC: 0 % (ref 0.0–0.2)

## 2022-04-07 MED ORDER — FERROUS SULFATE 325 (65 FE) MG PO TABS
325.0000 mg | ORAL_TABLET | Freq: Three times a day (TID) | ORAL | Status: DC
  Administered 2022-04-07 – 2022-04-11 (×13): 325 mg via ORAL
  Filled 2022-04-07 (×13): qty 1

## 2022-04-07 MED ORDER — SODIUM CHLORIDE 0.9% IV SOLUTION: Freq: Once | INTRAVENOUS | Status: AC

## 2022-04-07 MED ORDER — SODIUM CHLORIDE 0.9 % IV SOLN
1.0000 g | Freq: Two times a day (BID) | INTRAVENOUS | Status: DC
  Administered 2022-04-07 – 2022-04-11 (×8): 1 g via INTRAVENOUS
  Filled 2022-04-07 (×9): qty 20

## 2022-04-07 MED ORDER — VITAMIN B-12 1000 MCG PO TABS
1000.0000 ug | ORAL_TABLET | Freq: Every day | ORAL | Status: DC
  Administered 2022-04-07 – 2022-04-11 (×5): 1000 ug via ORAL
  Filled 2022-04-07 (×5): qty 1

## 2022-04-07 MED ORDER — SENNOSIDES-DOCUSATE SODIUM 8.6-50 MG PO TABS
2.0000 | ORAL_TABLET | Freq: Two times a day (BID) | ORAL | Status: DC
  Administered 2022-04-07 – 2022-04-08 (×2): 2 via ORAL
  Filled 2022-04-07 (×6): qty 2

## 2022-04-07 MED ORDER — FUROSEMIDE 10 MG/ML IJ SOLN
40.0000 mg | Freq: Once | INTRAMUSCULAR | Status: AC
  Administered 2022-04-07: 40 mg via INTRAVENOUS
  Filled 2022-04-07: qty 4

## 2022-04-07 MED ORDER — POLYETHYLENE GLYCOL 3350 17 G PO PACK
17.0000 g | PACK | Freq: Once | ORAL | Status: AC
  Administered 2022-04-07: 17 g via ORAL
  Filled 2022-04-07: qty 1

## 2022-04-07 NOTE — Progress Notes (Signed)
Rounding Note    Patient Name: SANFORD LINDBLAD Date of Encounter: 04/07/2022  [place] Cardiologist: Jenkins Rouge, MD   Subjective   Not feeling well but cannot pinpoint cause. No CP or SOB.  Inpatient Medications    Scheduled Meds:  ALPRAZolam  0.5 mg Oral QHS   amiodarone  200 mg Oral BID   amLODipine  10 mg Oral Daily   aspirin EC  81 mg Oral Q breakfast   calcitRIOL  0.25 mcg Oral Daily   Chlorhexidine Gluconate Cloth  6 each Topical Daily   colchicine  0.6 mg Oral Daily   cyanocobalamin  1,000 mcg Oral Daily   docusate sodium  100 mg Oral BID   feeding supplement  237 mL Oral TID BM   FLUoxetine  40 mg Oral Daily   fluticasone furoate-vilanterol  1 puff Inhalation Daily   furosemide  80 mg Intravenous BID   gabapentin  300 mg Oral QHS   heparin injection (subcutaneous)  5,000 Units Subcutaneous Q8H   insulin aspart  0-15 Units Subcutaneous TID WC   insulin aspart  4 Units Subcutaneous TID WC   insulin glargine-yfgn  10 Units Subcutaneous QHS   methylPREDNISolone (SOLU-MEDROL) injection  80 mg Intravenous Daily   metoprolol tartrate  50 mg Oral BID   pantoprazole  40 mg Oral Daily   tamsulosin  0.4 mg Oral QPC supper   Continuous Infusions:  sodium chloride Stopped (04/01/22 0827)   DAPTOmycin (CUBICIN) 750 mg in sodium chloride 0.9 % IVPB 750 mg (04/05/22 1812)   meropenem (MERREM) IV 500 mg (04/07/22 0541)   methocarbamol (ROBAXIN) IV     PRN Meds: acetaminophen, albuterol, bisacodyl, HYDROcodone-acetaminophen, HYDROcodone-acetaminophen, magnesium citrate, methocarbamol **OR** methocarbamol (ROBAXIN) IV, metoCLOPramide **OR** metoCLOPramide (REGLAN) injection, nitroGLYCERIN, ondansetron **OR** ondansetron (ZOFRAN) IV, mouth rinse, polyethylene glycol   Vital Signs    Vitals:   04/07/22 0728 04/07/22 0740 04/07/22 0800 04/07/22 0855  BP: (!) 159/61  (!) 152/72   Pulse: 61 61 62 73  Resp: '15 18 14 '$ (!) 24  Temp: 97.9 F (36.6 C)     TempSrc:  Axillary     SpO2: 100% 100% 100% 92%  Weight:      Height:        Intake/Output Summary (Last 24 hours) at 04/07/2022 0908 Last data filed at 04/06/2022 2308 Gross per 24 hour  Intake --  Output 1450 ml  Net -1450 ml      04/07/2022    6:18 AM 04/06/2022    6:38 AM 03/28/2022    6:41 AM  Last 3 Weights  Weight (lbs) 282 lb 10.1 oz 279 lb 5.2 oz 255 lb  Weight (kg) 128.2 kg 126.7 kg 115.667 kg      Telemetry    NSR - Personally Reviewed  ECG    No new tracings- Personally Reviewed  Physical Exam   GEN: No acute distress. Sitting in the bed  Neck: No JVD Cardiac: RRR, faint SEM  Respiratory: clear. Wearing [place] with supplemental oxygen  GI: Soft, nontender, non-distended  MS: no RLE edema. Left lower extremity covered in bandages with wound vac in place Neuro:  Nonfocal  Psych: Normal affect   Labs    High Sensitivity Troponin:   Recent Labs  Lab 04/06/22 1438 04/06/22 1629  TROPONINIHS 13 12     Chemistry Recent Labs  Lab 04/05/22 0341 04/06/22 0636 04/07/22 0547  NA 143 141 144  K 4.0 3.7 4.6  CL 110 107  110  CO2 '23 23 23  '$ GLUCOSE 111* 137* 189*  BUN 61* 60* 66*  CREATININE 3.14* 3.02* 3.01*  CALCIUM 8.5* 8.3* 8.9  GFRNONAA 20* 21* 21*  ANIONGAP '10 11 11    '$ Lipids No results for input(s): "CHOL", "TRIG", "HDL", "LABVLDL", "LDLCALC", "CHOLHDL" in the last 168 hours.  Hematology Recent Labs  Lab 04/05/22 0341 04/06/22 0636 04/06/22 1159 04/07/22 0547  WBC 7.7 9.7  --  6.2  RBC 2.55* 2.57* 2.69* 2.42*  HGB 7.7* 8.1*  --  7.4*  HCT 24.7* 24.1*  --  22.8*  MCV 96.9 93.8  --  94.2  MCH 30.2 31.5  --  30.6  MCHC 31.2 33.6  --  32.5  RDW 14.5 14.5  --  14.4  PLT 225 234  --  236   Thyroid No results for input(s): "TSH", "FREET4" in the last 168 hours.  BNP Recent Labs  Lab 04/04/22 0435  BNP 985.2*    DDimer No results for input(s): "DDIMER" in the last 168 hours.   Radiology    ECHOCARDIOGRAM COMPLETE  Result Date: 04/05/2022     ECHOCARDIOGRAM REPORT   Patient Name:   CALOB BASKETTE [place] Date of Exam: 04/05/2022 Medical Rec #:  [id number]      Height:       72.0 in Accession #:    [id number]     Weight:       255.0 lb Date of Birth:  April 05, 1951      BSA:          2.362 m Patient Age:    71 years       BP:           147/66 mmHg Patient Gender: M              HR:           65 bpm. Exam Location:  Inpatient Procedure: 2D Echo, Color Doppler, Cardiac Doppler and Intracardiac            Opacification Agent Indications:    Atrial Fibrillation I48.91  History:        Patient has prior history of Echocardiogram examinations, most                 recent 12/19/2019. Stroke and COPD; Risk Factors:Hypertension,                 Diabetes, Dyslipidemia and Former Smoker. Chronic kidney                 disease.  Sonographer:    Darlina Sicilian RDCS Referring Phys: [place]  1. Left ventricular ejection fraction, by estimation, is 60 to 65%. The left ventricle has normal function. The left ventricle has no regional wall motion abnormalities. The left ventricular internal cavity size was mildly dilated. There is moderate concentric left ventricular hypertrophy. Left ventricular diastolic parameters are indeterminate. Elevated left ventricular end-diastolic pressure.  2. Right ventricular systolic function is normal. The right ventricular size is moderately enlarged. There is moderately elevated pulmonary artery systolic pressure. The estimated right ventricular systolic pressure is 77.4 mmHg.  3. The mitral valve is normal in structure. Trivial mitral valve regurgitation. No evidence of mitral stenosis.  4. The aortic valve is tricuspid. Aortic valve regurgitation is trivial. Aortic valve sclerosis is present, with no evidence of aortic valve stenosis.  5. The inferior vena cava is dilated in size with >50% respiratory variability, suggesting right atrial pressure of 8  mmHg. FINDINGS  Left Ventricle: Left ventricular ejection fraction, by  estimation, is 60 to 65%. The left ventricle has normal function. The left ventricle has no regional wall motion abnormalities. Definity contrast agent was given IV to delineate the left ventricular  endocardial borders. The left ventricular internal cavity size was mildly dilated. There is moderate concentric left ventricular hypertrophy. Left ventricular diastolic parameters are indeterminate. Elevated left ventricular end-diastolic pressure. Right Ventricle: The right ventricular size is moderately enlarged. No increase in right ventricular wall thickness. Right ventricular systolic function is normal. There is moderately elevated pulmonary artery systolic pressure. The tricuspid regurgitant  velocity is 3.09 m/s, and with an assumed right atrial pressure of 8 mmHg, the estimated right ventricular systolic pressure is 54.6 mmHg. Left Atrium: Left atrial size was normal in size. Right Atrium: Right atrial size was normal in size. Pericardium: There is no evidence of pericardial effusion. Mitral Valve: The mitral valve is normal in structure. Trivial mitral valve regurgitation. No evidence of mitral valve stenosis. MV peak gradient, 6.2 mmHg. Tricuspid Valve: The tricuspid valve is normal in structure. Tricuspid valve regurgitation is mild . No evidence of tricuspid stenosis. Aortic Valve: The aortic valve is tricuspid. Aortic valve regurgitation is trivial. Aortic valve sclerosis is present, with no evidence of aortic valve stenosis. Pulmonic Valve: The pulmonic valve was normal in structure. Pulmonic valve regurgitation is trivial. No evidence of pulmonic stenosis. Aorta: The aortic root is normal in size and structure. Venous: The inferior vena cava is dilated in size with greater than 50% respiratory variability, suggesting right atrial pressure of 8 mmHg. IAS/Shunts: No atrial level shunt detected by color flow Doppler.  LEFT VENTRICLE PLAX 2D LVIDd:         5.15 cm      Diastology LVIDs:         3.55 cm       LV e' medial:    3.99 cm/s LV PW:         1.35 cm      LV E/e' medial:  30.3 LV IVS:        1.50 cm      LV e' lateral:   10.40 cm/s LVOT diam:     2.20 cm      LV E/e' lateral: 11.6 LV SV:         96 LV SV Index:   41 LVOT Area:     3.80 cm  LV Volumes (MOD) LV vol d, MOD A2C: 233.0 ml LV vol d, MOD A4C: 192.0 ml LV vol s, MOD A2C: 70.6 ml LV vol s, MOD A4C: 72.1 ml LV SV MOD A2C:     162.4 ml LV SV MOD A4C:     192.0 ml LV SV MOD BP:      140.6 ml RIGHT VENTRICLE RV Basal diam:  5.20 cm RV Mid diam:    4.10 cm RV S prime:     8.55 cm/s TAPSE (M-mode): 1.9 cm LEFT ATRIUM             Index        RIGHT ATRIUM           Index LA diam:        4.20 cm 1.78 cm/m   RA Area:     15.60 cm LA Vol (A2C):   79.0 ml 33.44 ml/m  RA Volume:   31.20 ml  13.21 ml/m LA Vol (A4C):   51.8 ml 21.93 ml/m LA Biplane Vol: 67.4  ml 28.53 ml/m  AORTIC VALVE LVOT Vmax:   117.00 cm/s LVOT Vmean:  75.400 cm/s LVOT VTI:    0.252 m  AORTA Ao Root diam: 3.30 cm Ao Asc diam:  3.40 cm MITRAL VALVE                TRICUSPID VALVE MV Area (PHT): 4.86 cm     TR Peak grad:   38.2 mmHg MV Peak grad:  6.2 mmHg     TR Vmax:        309.00 cm/s MV Vmax:       1.24 m/s MV Decel Time: 156 msec     SHUNTS MV E velocity: 121.00 cm/s  Systemic VTI:  0.25 m MV A velocity: 71.80 cm/s   Systemic Diam: 2.20 cm MV E/A ratio:  1.69 Fransico Him MD Electronically signed by Fransico Him MD Signature Date/Time: 04/05/2022/2:11:52 PM    Final     Cardiac Studies   Echocardiogram 04/05/22 1. Left ventricular ejection fraction, by estimation, is 60 to 65%. The  left ventricle has normal function. The left ventricle has no regional  wall motion abnormalities. The left ventricular internal cavity size was  mildly dilated. There is moderate  concentric left ventricular hypertrophy. Left ventricular diastolic  parameters are indeterminate. Elevated left ventricular end-diastolic  pressure.   2. Right ventricular systolic function is normal. The right  ventricular  size is moderately enlarged. There is moderately elevated pulmonary artery  systolic pressure. The estimated right ventricular systolic pressure is  21.1 mmHg.   3. The mitral valve is normal in structure. Trivial mitral valve  regurgitation. No evidence of mitral stenosis.   4. The aortic valve is tricuspid. Aortic valve regurgitation is trivial.  Aortic valve sclerosis is present, with no evidence of aortic valve  stenosis.   5. The inferior vena cava is dilated in size with >50% respiratory  variability, suggesting right atrial pressure of 8 mmHg.   Patient Profile     71 y.o. male with a hx of HTN, HFpEF, COPD onn 3L chronically,  CKD IV, DM, and anemia seen for afib RVR, now in SR, in setting of osteomyelitis and acute decompensated HFpEF.  Assessment & Plan    New onset atrial fibrillation with RVR - Developed in the setting of osteomyelitis, CHF.  Episode 11/2 lasting from 3:47 AM until 10:20 AM with conversion back to sinus rhythm. Per telemetry, patient is maintaining NSR  - PO amiodarone to help support NSR and to prevent recurrence of afib  - Continue metoprolol '50mg'$  BID  - CHADS-VASc 5, hemoglobin low. Given his worsening anemia, would not anticoagulate at this time. Anemia w/u per primary. - Consider outpatient monitor at DC to see if patient has recurrence of afib. May need anticoagulation in the further if he has recurrence of Afib and if hemoglobin stabilizes. Will coordinate with primary team prior to dismissal.  Osteomyelitis S/p hardware removal - Infectious disease following - Plan for 6 weeks of IV daptomycin and meropenem   Acute on chronic hypoxic respiratory failure HFpEF COPD HCAP? - Echo this admission with EF 60-65%, moderate LVH, elevated LVEDP, normal RV systolic function  - CXR 94/17 with moderate bilateral lower lung predominant interstitial opacities with may represent pulmonary edema vs atypical infection. CXR 11/2 with similar lower  lung opacities that are likely due to pulmonary edema  - Patient was started on IV lasix 80 mg BID, appropriate output. Weights inaccurate. - Renal function stable/improving. Creatinine was up to 3.78  on 10/25. Was 3.14 yesterday and is now 3.02  - baseline O2 requirements of 3L [place]. Intermittently requiring Bi-PAP. Patient did have an episode of SOB 11/3  and required Bi-PAP. Lung fields are clearing with diuresis and BD. - Continue IV lasix today and consider transition to torsemide tomorrow based on UOP and renal parameters. Patient has duo-neb treatments ordered and PRN albuterol. Also has incentive spirometer.    CKD IV - follows with nephrology as an outpatient  - baseline Cr around 3-3.5 - follow BMET daily as patient is on diuretics    Anemia - on chronic disease with baseline Hgb of 10-11, Hgb 7.4 - no reports of bleeding - management per primary   For questions or updates, please contact [place] Please consult [email or web address] for contact info under        Signed, Elouise Munroe, MD  04/07/2022, 9:08 AM

## 2022-04-07 NOTE — Progress Notes (Addendum)
Mobility Specialist Progress Note    04/07/22 1430  Mobility  Activity Transferred from chair to bed  Level of Assistance +2 (takes two people)  Assistive Device MaxiMove  LLE Weight Bearing NWB  Activity Response Tolerated well  Mobility Referral Yes  $Mobility charge 1 Mobility   RN notified M.S. pt c/o pain. Pt stated his butt was hurting. Left with call bell in reach.   Hildred Alamin Mobility Specialist  Secure Chat Only

## 2022-04-07 NOTE — Progress Notes (Addendum)
PROGRESS NOTE    Eric Scott  IDP:[id number] DOB: 09-26-1950 DOA: 03/28/2022 PCP: [name], MD    No chief complaint on file.   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 Sharol Given after referral from PCP and had exposed hardware He was admitted by Dr. Due to 10/25 underwent left ankle hardware removal with partial distal fibular excision application of 95 cm Kerecis micro powder and local tissue arrangement 15 X3   ID saw the patient for antibiotic recommendations   Postoperatively he was planned for discharge but then developed shortness of breath on 10/29 and hospitalist service was contacted He was placed on BiPAP sent to stepdown unit    Assessment & Plan:   Principal Problem:   Leg wound, left, sequela Active Problems:   Acute on chronic respiratory failure with hypoxia ([name])   Chronic pain (back, legs)   Stage 4 chronic kidney disease ([place])   Type 2 diabetes mellitus with stage 4 chronic kidney disease, with Maynez-term current use of insulin ([name])   HTN (hypertension)   Mood disorder ([name])   PAF (paroxysmal atrial fibrillation) ([name])   Leg wound, left, 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 -Line was placed 10/31 .   Acute on chronic respiratory failure with hypoxia ([name]) -Patient with COPD, on 3L home O2 -Continue home Breo, Duonebs -Multiple opacities  on imaging most likely related to volume overload than actual infection given no leukocytosis, no fever but he is empirically covered with IV antibiotics for his osteomyelitis. -Remains with increased oxygen requirement . -Was encouraged to use incentive spirometer . -Still requiring BiPAP as needed he did require BiPAP overnight.   . -Repeat x-ray still showing opacities likely to volume overload, continue with IV diuresis at 80 mg IV twice daily,   COPD exacerbation -Started on IV Solu-Medrol  Atrial fibrillation, new diagnosis  -Cardiology input greatly appreciated , . -At this point anticoagulation risks felt to outweigh benefits.  Recurrence, likely will need heart monitor on discharge.  Anemia of chronic kidney disease -will transfuse 1 unit PRBC today given he is traumatic. -Panel significant for normal ferritin, but low saturation and iron level, would like to avoid IV iron in the setting of infection, so we will start on p.o. supplements, he would likely benefit from Aranesp in the future once iron storage is repleted .  Acute on chronic diastolic CHF -Volume overload on imaging, and significant oxygen requirement. -Continue with IV Lasix, monitor his ins and outs closely.    Chronic pain (back, legs) -He has chronic pain in addition to acute pain -Cannot review PDMP since he is in a SNF -Continue Norco, Neurontin or as per orthopedics   Stage 4 chronic kidney disease ([place]) -Appears to be stable at this time -Attempt to avoid nephrotoxic medications -Recheck BMP periodically -Continue calcitriol -Continue colchicine for gout   Mood disorder ([place]) -Continue fluoxetine, alprazolam   HTN (hypertension) -Continue  home amlodipine, metoprolol   Type 2 diabetes mellitus with stage 4 chronic kidney disease, with Kaczmarczyk-term current use of insulin ([name]) -A1c is 5.8, indicating good control -Continue glargine -Cover with moderate-scale SSI   COPD -Some minimal wheezing  today, will start on low-dose steroids and nebs treatment     DVT prophylaxis: Trilby heparin Code Status: (Full) Family Communication: None at bedside  Status is: Inpatient  Consultants:  Ortho >> [facility]    Subjective:  He did have an episode of chest pain and shortness of breath yesterday, EKG and troponins were reassuring, he did require BiPAP overnight  Objective: Vitals:   04/07/22 0740 04/07/22 0800 04/07/22 0855 04/07/22 1008  BP:  (!) 152/72  (!) 145/79  Pulse: 61 62 73 65  Resp: 18 14 (!) 24 18  Temp:    (!) 97.5 F (36.4 C)  TempSrc:    Oral  SpO2: 100% 100% 92% 94%  Weight:      Height:        Intake/Output Summary (Last 24 hours) at 04/07/2022 1022 Last data filed at 04/06/2022 2308 Gross per 24 hour  Intake --  Output 1450 ml  Net -1450 ml   Filed Weights   03/28/22 0641 04/06/22 0638 04/07/22 0618  Weight: 115.7 kg 126.7 kg 128.2 kg    Examination:    Awake Alert, Oriented X 3, frail Symmetrical Chest wall movement, Good air movement bilaterally, CTAB RRR,No Gallops,Rubs or new Murmurs, No Parasternal Heave +ve B.Sounds, Abd Soft, No tenderness, No rebound - guarding or rigidity. No Cyanosis, Clubbing or edema,left  foot connected to wound VAC   Data Reviewed: I have personally reviewed following labs and imaging studies  CBC: Recent Labs  Lab 04/02/22 0018 04/04/22 0435 04/05/22 0341 04/06/22 0636 04/07/22 0547  WBC 9.5 8.3 7.7 9.7 6.2  HGB 8.5* 8.1* 7.7* 8.1* 7.4*  HCT 26.1* 25.8* 24.7* 24.1* 22.8*  MCV 95.3 95.6 96.9 93.8 94.2  PLT 198 210 225 234 878    Basic Metabolic Panel: Recent Labs  Lab 04/02/22 0018 04/04/22 0435 04/05/22 0341 04/06/22 0636 04/07/22 0547  NA 143 140 143 141 144  K 3.7 3.8 4.0 3.7 4.6  CL 110 107 110 107 110  CO2 22 21* '23 23 23  '$ GLUCOSE 194* 124* 111* 137* 189*  BUN 57* 64* 61* 60* 66*  CREATININE 3.40* 3.29* 3.14* 3.02* 3.01*  CALCIUM 8.6* 8.5* 8.5* 8.3* 8.9    GFR: Estimated  Creatinine Clearance: 31.1 mL/min (A) (by C-G formula based on SCr of 3.01 mg/dL (H)).  Liver Function Tests: No results for input(s): "AST", "ALT", "ALKPHOS", "BILITOT", "PROT", "ALBUMIN" in the last 168 hours.  CBG: Recent Labs  Lab 04/06/22 0635 04/06/22 1114 04/06/22 1609 04/06/22 2151 04/07/22 0616  GLUCAP 125* 195* 202* 218* 168*     No results found for this or any previous visit (from the past 240 hour(s)).        Radiology Studies: ECHOCARDIOGRAM COMPLETE  Result Date: 04/05/2022    ECHOCARDIOGRAM REPORT   Patient Name:   Eric Scott Eric Scott Date of Exam: 04/05/2022 Medical Rec #:  [id number]      Height:       72.0 in Accession #:    [id number]     Weight:       255.0 lb Date of Birth:  Dec 31, 1950      BSA:          2.362 m Patient Age:    61 years  BP:           147/66 mmHg Patient Gender: M              HR:           65 bpm. Exam Location:  Inpatient Procedure: 2D Echo, Color Doppler, Cardiac Doppler and Intracardiac            Opacification Agent Indications:    Atrial Fibrillation I48.91  History:        Patient has prior history of Echocardiogram examinations, most                 recent 12/19/2019. Stroke and COPD; Risk Factors:Hypertension,                 Diabetes, Dyslipidemia and Former Smoker. Chronic kidney                 disease.  Sonographer:    Darlina Sicilian RDCS Referring Phys: [place]  1. Left ventricular ejection fraction, by estimation, is 60 to 65%. The left ventricle has normal function. The left ventricle has no regional wall motion abnormalities. The left ventricular internal cavity size was mildly dilated. There is moderate concentric left ventricular hypertrophy. Left ventricular diastolic parameters are indeterminate. Elevated left ventricular end-diastolic pressure.  2. Right ventricular systolic function is normal. The right ventricular size is moderately enlarged. There is moderately elevated pulmonary artery systolic  pressure. The estimated right ventricular systolic pressure is 09.3 mmHg.  3. The mitral valve is normal in structure. Trivial mitral valve regurgitation. No evidence of mitral stenosis.  4. The aortic valve is tricuspid. Aortic valve regurgitation is trivial. Aortic valve sclerosis is present, with no evidence of aortic valve stenosis.  5. The inferior vena cava is dilated in size with >50% respiratory variability, suggesting right atrial pressure of 8 mmHg. FINDINGS  Left Ventricle: Left ventricular ejection fraction, by estimation, is 60 to 65%. The left ventricle has normal function. The left ventricle has no regional wall motion abnormalities. Definity contrast agent was given IV to delineate the left ventricular  endocardial borders. The left ventricular internal cavity size was mildly dilated. There is moderate concentric left ventricular hypertrophy. Left ventricular diastolic parameters are indeterminate. Elevated left ventricular end-diastolic pressure. Right Ventricle: The right ventricular size is moderately enlarged. No increase in right ventricular wall thickness. Right ventricular systolic function is normal. There is moderately elevated pulmonary artery systolic pressure. The tricuspid regurgitant  velocity is 3.09 m/s, and with an assumed right atrial pressure of 8 mmHg, the estimated right ventricular systolic pressure is 23.5 mmHg. Left Atrium: Left atrial size was normal in size. Right Atrium: Right atrial size was normal in size. Pericardium: There is no evidence of pericardial effusion. Mitral Valve: The mitral valve is normal in structure. Trivial mitral valve regurgitation. No evidence of mitral valve stenosis. MV peak gradient, 6.2 mmHg. Tricuspid Valve: The tricuspid valve is normal in structure. Tricuspid valve regurgitation is mild . No evidence of tricuspid stenosis. Aortic Valve: The aortic valve is tricuspid. Aortic valve regurgitation is trivial. Aortic valve sclerosis is present,  with no evidence of aortic valve stenosis. Pulmonic Valve: The pulmonic valve was normal in structure. Pulmonic valve regurgitation is trivial. No evidence of pulmonic stenosis. Aorta: The aortic root is normal in size and structure. Venous: The inferior vena cava is dilated in size with greater than 50% respiratory variability, suggesting right atrial pressure of 8 mmHg. IAS/Shunts: No atrial level shunt detected by  color flow Doppler.  LEFT VENTRICLE PLAX 2D LVIDd:         5.15 cm      Diastology LVIDs:         3.55 cm      LV e' medial:    3.99 cm/s LV PW:         1.35 cm      LV E/e' medial:  30.3 LV IVS:        1.50 cm      LV e' lateral:   10.40 cm/s LVOT diam:     2.20 cm      LV E/e' lateral: 11.6 LV SV:         96 LV SV Index:   41 LVOT Area:     3.80 cm  LV Volumes (MOD) LV vol d, MOD A2C: 233.0 ml LV vol d, MOD A4C: 192.0 ml LV vol s, MOD A2C: 70.6 ml LV vol s, MOD A4C: 72.1 ml LV SV MOD A2C:     162.4 ml LV SV MOD A4C:     192.0 ml LV SV MOD BP:      140.6 ml RIGHT VENTRICLE RV Basal diam:  5.20 cm RV Mid diam:    4.10 cm RV S prime:     8.55 cm/s TAPSE (M-mode): 1.9 cm LEFT ATRIUM             Index        RIGHT ATRIUM           Index LA diam:        4.20 cm 1.78 cm/m   RA Area:     15.60 cm LA Vol (A2C):   79.0 ml 33.44 ml/m  RA Volume:   31.20 ml  13.21 ml/m LA Vol (A4C):   51.8 ml 21.93 ml/m LA Biplane Vol: 67.4 ml 28.53 ml/m  AORTIC VALVE LVOT Vmax:   117.00 cm/s LVOT Vmean:  75.400 cm/s LVOT VTI:    0.252 m  AORTA Ao Root diam: 3.30 cm Ao Asc diam:  3.40 cm MITRAL VALVE                TRICUSPID VALVE MV Area (PHT): 4.86 cm     TR Peak grad:   38.2 mmHg MV Peak grad:  6.2 mmHg     TR Vmax:        309.00 cm/s MV Vmax:       1.24 m/s MV Decel Time: 156 msec     SHUNTS MV E velocity: 121.00 cm/s  Systemic VTI:  0.25 m MV A velocity: 71.80 cm/s   Systemic Diam: 2.20 cm MV E/A ratio:  1.69 Fransico Him MD Electronically signed by Fransico Him MD Signature Date/Time: 04/05/2022/2:11:52 PM    Final          Scheduled Meds:  ALPRAZolam  0.5 mg Oral QHS   amiodarone  200 mg Oral BID   amLODipine  10 mg Oral Daily   aspirin EC  81 mg Oral Q breakfast   calcitRIOL  0.25 mcg Oral Daily   Chlorhexidine Gluconate Cloth  6 each Topical Daily   colchicine  0.6 mg Oral Daily   cyanocobalamin  1,000 mcg Oral Daily   docusate sodium  100 mg Oral BID   feeding supplement  237 mL Oral TID BM   FLUoxetine  40 mg Oral Daily   fluticasone furoate-vilanterol  1 puff Inhalation Daily   furosemide  40 mg Intravenous Once   furosemide  80 mg Intravenous BID   gabapentin  300 mg Oral QHS   heparin injection (subcutaneous)  5,000 Units Subcutaneous Q8H   insulin aspart  0-15 Units Subcutaneous TID WC   insulin aspart  4 Units Subcutaneous TID WC   insulin glargine-yfgn  10 Units Subcutaneous QHS   methylPREDNISolone (SOLU-MEDROL) injection  80 mg Intravenous Daily   metoprolol tartrate  50 mg Oral BID   pantoprazole  40 mg Oral Daily   tamsulosin  0.4 mg Oral QPC supper   Continuous Infusions:  sodium chloride Stopped (04/01/22 0827)   DAPTOmycin (CUBICIN) 750 mg in sodium chloride 0.9 % IVPB 750 mg (04/05/22 1812)   meropenem (MERREM) IV 500 mg (04/07/22 0541)   methocarbamol (ROBAXIN) IV       LOS: 9 days       Phillips Climes, MD [facility]   To contact the attending provider between 7A-7P or the covering provider during after hours 7P-7A, please log into the web site [email or web address] and access using universal  password for that web site. If you do not have the password, please call the hospital operator.  04/07/2022, 10:22 AM

## 2022-04-07 NOTE — Progress Notes (Signed)
Mobility Specialist Progress Note    04/07/22 1347  Mobility  Activity Transferred from bed to chair  Level of Assistance +2 (takes two people)  Assistive Device MaxiMove  LLE Weight Bearing NWB  Activity Response Tolerated fair  Mobility Referral Yes  $Mobility charge 1 Mobility   Pt received and agreeable. C/o feeling like he cannot breath. SpO2 in 90s. Encouraged pursed lip breathing. Left with call bell in reach. RN notified pt requesting BiPap.   Hildred Alamin Mobility Specialist  Secure Chat Only

## 2022-04-08 DIAGNOSIS — I480 Paroxysmal atrial fibrillation: Secondary | ICD-10-CM | POA: Diagnosis not present

## 2022-04-08 DIAGNOSIS — I10 Essential (primary) hypertension: Secondary | ICD-10-CM | POA: Diagnosis not present

## 2022-04-08 DIAGNOSIS — S81802S Unspecified open wound, left lower leg, sequela: Secondary | ICD-10-CM | POA: Diagnosis not present

## 2022-04-08 DIAGNOSIS — N184 Chronic kidney disease, stage 4 (severe): Secondary | ICD-10-CM | POA: Diagnosis not present

## 2022-04-08 LAB — BASIC METABOLIC PANEL
Anion gap: 13 (ref 5–15)
BUN: 74 mg/dL — ABNORMAL HIGH (ref 8–23)
CO2: 22 mmol/L (ref 22–32)
Calcium: 8.4 mg/dL — ABNORMAL LOW (ref 8.9–10.3)
Chloride: 102 mmol/L (ref 98–111)
Creatinine, Ser: 2.99 mg/dL — ABNORMAL HIGH (ref 0.61–1.24)
GFR, Estimated: 22 mL/min — ABNORMAL LOW (ref >60)
Glucose, Bld: 286 mg/dL — ABNORMAL HIGH (ref 70–99)
Potassium: 4.9 mmol/L (ref 3.5–5.1)
Sodium: 137 mmol/L (ref 135–145)

## 2022-04-08 LAB — GLUCOSE, CAPILLARY
Glucose-Capillary: 263 mg/dL — ABNORMAL HIGH (ref 70–99)
Glucose-Capillary: 240 mg/dL — ABNORMAL HIGH (ref 70–99)
Glucose-Capillary: 238 mg/dL — ABNORMAL HIGH (ref 70–99)
Glucose-Capillary: 361 mg/dL — ABNORMAL HIGH (ref 70–99)
Glucose-Capillary: 304 mg/dL — ABNORMAL HIGH (ref 70–99)

## 2022-04-08 LAB — CBC
HCT: 24.9 % — ABNORMAL LOW (ref 39.0–52.0)
Hemoglobin: 8.1 g/dL — ABNORMAL LOW (ref 13.0–17.0)
MCH: 30.6 pg (ref 26.0–34.0)
MCHC: 32.5 g/dL (ref 30.0–36.0)
MCV: 94 fL (ref 80.0–100.0)
Platelets: 249 10*3/uL (ref 150–400)
RBC: 2.65 MIL/uL — ABNORMAL LOW (ref 4.22–5.81)
RDW: 14.4 % (ref 11.5–15.5)
WBC: 7.2 10*3/uL (ref 4.0–10.5)
nRBC: 0 % (ref 0.0–0.2)

## 2022-04-08 LAB — TYPE AND SCREEN
ABO/RH(D): O NEG
Antibody Screen: NEGATIVE
Unit division: 0

## 2022-04-08 LAB — BPAM RBC
Blood Product Expiration Date: 202311222359
ISSUE DATE / TIME: 202311041001
Unit Type and Rh: 9500

## 2022-04-08 MED ORDER — HYDRALAZINE HCL 25 MG PO TABS
25.0000 mg | ORAL_TABLET | Freq: Four times a day (QID) | ORAL | Status: DC
  Administered 2022-04-08 – 2022-04-11 (×13): 25 mg via ORAL
  Filled 2022-04-08 (×13): qty 1

## 2022-04-08 MED ORDER — METOLAZONE 2.5 MG PO TABS
2.5000 mg | ORAL_TABLET | Freq: Once | ORAL | Status: AC
  Administered 2022-04-08: 2.5 mg via ORAL
  Filled 2022-04-08: qty 1

## 2022-04-08 MED ORDER — METOPROLOL TARTRATE 25 MG PO TABS
25.0000 mg | ORAL_TABLET | Freq: Two times a day (BID) | ORAL | Status: DC
  Administered 2022-04-08 – 2022-04-11 (×7): 25 mg via ORAL
  Filled 2022-04-08 (×6): qty 1

## 2022-04-08 NOTE — Progress Notes (Signed)
Patient ID: Eric Scott, male   DOB: [birth year], 71 y.o.   MRN: [id number] Patient seen in follow-up for removal of infected deep retained hardware lateral left ankle.  Cultures show multiple organisms including MRSA.  There is 350 cc in the wound VAC canister.  Patient is almost 2 weeks out from surgery.  We will plan to remove the wound VAC dressing before discharge.

## 2022-04-08 NOTE — Progress Notes (Signed)
Patient off BIPAP at this time. Doing well on 4 lpm nasal cannula; O2 sats 99%. Unit at bedside on standby.

## 2022-04-08 NOTE — Progress Notes (Addendum)
Pharmacy Antibiotic Note  Eric Scott is a 71 y.o. male admitted on 03/28/2022 with  osteomyelitis of left ankle s/p debridement and removal of hardware on 03/28/22. Pharmacy has been consulted for Meropenem and Daptomycin dosing.    Renal function stable, ClCr ~31 ml/min using AdjBW and 41 mL/min using actual BW. Given creatinine has been stable for 3 days, will adjust meropenem dosing. Last CK 11/3 was 26. If creatinine remains stable, daptomycin may need adjustment as well. WBC WNL  Plan: Meropenem 1g IV q12h Daptomycin 750 mg (~8 mg/kg adjusted body weight) IV q48hrs. Monitor clinical status, renal function, CK Q Fri OPAT entered - end date 12/6 > will need updating pending renal dosing   Height: 6' (182.9 cm) Weight: 128.2 kg (282 lb 10.1 oz) IBW/kg (Calculated) : 77.6  Temp (24hrs), Avg:97.7 F (36.5 C), Min:97.6 F (36.4 C), Max:98 F (36.7 C)  Recent Labs  Lab 04/04/22 0435 04/05/22 0341 04/06/22 0636 04/07/22 0547 04/08/22 0418  WBC 8.3 7.7 9.7 6.2 7.2  CREATININE 3.29* 3.14* 3.02* 3.01* 2.99*     Estimated Creatinine Clearance: 31.3 mL/min (A) (by C-G formula based on SCr of 2.99 mg/dL (H)).    Allergies  Allergen Reactions   Baclofen     unknown   Blueberry Flavor Other (See Comments)    Unknown   Cucumber Extract Other (See Comments)    "Fells like I'm having a heart attack"   Flexeril [Cyclobenzaprine Hcl] Other (See Comments)    "whole body  Tremors" (05/27/2012)   Kiwi Extract Other (See Comments)    "feels like I'm having a heart attack"    Antimicrobials this admission: Zyvox PO from PTA 10/24 >> 10/27 Ceftriaxone from PTA 10/24 (was IM outpt IV inpt) >> 10/27 Daptomycin 10/27 >> 12/6 Meropenem 10/27 >> 12/6  CK 10/28: 36 CK 11/3: 26   Microbiology results: 10/25 surgical wound cx: no organims on gram stain > recincubated > rare Acinetobacter, rare Morganella, rare E faecalis (VRE), rare MRSA  Thank you for allowing pharmacy to  participate in this patient's care.  Reatha Harps, PharmD PGY2 Pharmacy Resident 04/08/2022 11:19 AM Check [email or web address] for unit specific pharmacy number

## 2022-04-08 NOTE — Progress Notes (Signed)
Rounding Note    Patient Name: Eric Scott Date of Encounter: 04/08/2022  [place] Cardiologist: Jenkins Rouge, MD   Subjective   Continued SOB, intermittently requiring bipap. D/w bedside nurse and primary team.   Inpatient Medications    Scheduled Meds:  ALPRAZolam  0.5 mg Oral QHS   amiodarone  200 mg Oral BID   amLODipine  10 mg Oral Daily   aspirin EC  81 mg Oral Q breakfast   calcitRIOL  0.25 mcg Oral Daily   Chlorhexidine Gluconate Cloth  6 each Topical Daily   colchicine  0.6 mg Oral Daily   cyanocobalamin  1,000 mcg Oral Daily   docusate sodium  100 mg Oral BID   feeding supplement  237 mL Oral TID BM   ferrous sulfate  325 mg Oral TID WC   FLUoxetine  40 mg Oral Daily   fluticasone furoate-vilanterol  1 puff Inhalation Daily   furosemide  80 mg Intravenous BID   gabapentin  300 mg Oral QHS   heparin injection (subcutaneous)  5,000 Units Subcutaneous Q8H   insulin aspart  0-15 Units Subcutaneous TID WC   insulin aspart  4 Units Subcutaneous TID WC   insulin glargine-yfgn  10 Units Subcutaneous QHS   methylPREDNISolone (SOLU-MEDROL) injection  80 mg Intravenous Daily   metoprolol tartrate  50 mg Oral BID   pantoprazole  40 mg Oral Daily   senna-docusate  2 tablet Oral BID   tamsulosin  0.4 mg Oral QPC supper   Continuous Infusions:  sodium chloride Stopped (04/01/22 0827)   DAPTOmycin (CUBICIN) 750 mg in sodium chloride 0.9 % IVPB Stopped (04/07/22 1733)   meropenem (MERREM) IV 1 g (04/08/22 0436)   methocarbamol (ROBAXIN) IV     PRN Meds: acetaminophen, albuterol, bisacodyl, HYDROcodone-acetaminophen, HYDROcodone-acetaminophen, magnesium citrate, methocarbamol **OR** methocarbamol (ROBAXIN) IV, metoCLOPramide **OR** metoCLOPramide (REGLAN) injection, nitroGLYCERIN, ondansetron **OR** ondansetron (ZOFRAN) IV, mouth rinse, polyethylene glycol   Vital Signs    Vitals:   04/08/22 0020 04/08/22 0300 04/08/22 0751 04/08/22 0810  BP: (!)  153/81 (!) 119/53  (!) 163/67  Pulse: (!) 53 (!) 55  (!) 56  Resp: '15 13  17  '$ Temp:  97.8 F (36.6 C)  97.6 F (36.4 C)  TempSrc:  Oral  Oral  SpO2: 99% 98% 99% 99%  Weight:      Height:        Intake/Output Summary (Last 24 hours) at 04/08/2022 0812 Last data filed at 04/08/2022 0811 Gross per 24 hour  Intake 1195 ml  Output 1500 ml  Net -305 ml      04/07/2022    6:18 AM 04/06/2022    6:38 AM 03/28/2022    6:41 AM  Last 3 Weights  Weight (lbs) 282 lb 10.1 oz 279 lb 5.2 oz 255 lb  Weight (kg) 128.2 kg 126.7 kg 115.667 kg      Telemetry    Sinus bradycardia - Personally Reviewed  ECG    No new tracings- Personally Reviewed  Physical Exam   GEN: No acute distress. Sitting in the bed  Neck: No JVD Cardiac: RRR, faint SEM  Respiratory: diminished in bases. Wearing [place] with supplemental oxygen  GI: Soft, nontender, non-distended  MS: no RLE edema. Left lower extremity covered in bandages with wound vac in place Neuro:  Nonfocal  Psych: Normal affect   Labs    High Sensitivity Troponin:   Recent Labs  Lab 04/06/22 1438 04/06/22 1629  TROPONINIHS 13 12  Chemistry Recent Labs  Lab 04/06/22 0636 04/07/22 0547 04/08/22 0418  NA 141 144 137  K 3.7 4.6 4.9  CL 107 110 102  CO2 '23 23 22  '$ GLUCOSE 137* 189* 286*  BUN 60* 66* 74*  CREATININE 3.02* 3.01* 2.99*  CALCIUM 8.3* 8.9 8.4*  GFRNONAA 21* 21* 22*  ANIONGAP '11 11 13    '$ Lipids No results for input(s): "CHOL", "TRIG", "HDL", "LABVLDL", "LDLCALC", "CHOLHDL" in the last 168 hours.  Hematology Recent Labs  Lab 04/06/22 0636 04/06/22 1159 04/07/22 0547 04/07/22 1600 04/08/22 0418  WBC 9.7  --  6.2  --  7.2  RBC 2.57* 2.69* 2.42*  --  2.65*  HGB 8.1*  --  7.4* 8.7* 8.1*  HCT 24.1*  --  22.8* 27.7* 24.9*  MCV 93.8  --  94.2  --  94.0  MCH 31.5  --  30.6  --  30.6  MCHC 33.6  --  32.5  --  32.5  RDW 14.5  --  14.4  --  14.4  PLT 234  --  236  --  249   Thyroid No results for input(s): "TSH",  "FREET4" in the last 168 hours.  BNP Recent Labs  Lab 04/04/22 0435  BNP 985.2*    DDimer No results for input(s): "DDIMER" in the last 168 hours.   Radiology    No results found.  Cardiac Studies   Echocardiogram 04/05/22 1. Left ventricular ejection fraction, by estimation, is 60 to 65%. The  left ventricle has normal function. The left ventricle has no regional  wall motion abnormalities. The left ventricular internal cavity size was  mildly dilated. There is moderate  concentric left ventricular hypertrophy. Left ventricular diastolic  parameters are indeterminate. Elevated left ventricular end-diastolic  pressure.   2. Right ventricular systolic function is normal. The right ventricular  size is moderately enlarged. There is moderately elevated pulmonary artery  systolic pressure. The estimated right ventricular systolic pressure is  05.3 mmHg.   3. The mitral valve is normal in structure. Trivial mitral valve  regurgitation. No evidence of mitral stenosis.   4. The aortic valve is tricuspid. Aortic valve regurgitation is trivial.  Aortic valve sclerosis is present, with no evidence of aortic valve  stenosis.   5. The inferior vena cava is dilated in size with >50% respiratory  variability, suggesting right atrial pressure of 8 mmHg.   Patient Profile     71 y.o. male with a hx of HTN, HFpEF, COPD onn 3L chronically,  CKD IV, DM, and anemia seen for afib RVR, now in SR, in setting of osteomyelitis and acute decompensated HFpEF.  Assessment & Plan    New onset atrial fibrillation with RVR - Developed in the setting of osteomyelitis, CHF.  Episode 11/2 lasting from 3:47 AM until 10:20 AM with conversion back to sinus rhythm. Per telemetry, patient is maintaining NSR  - PO amiodarone to help support NSR and to prevent recurrence of afib  - metoprolol '50mg'$  BID - ok to continue or decrease dose for mild bradycardia.  - CHADS-VASc 5, hemoglobin low. Given his worsening  anemia, would not anticoagulate at this time. Anemia w/u per primary. Received blood products 04/07/22, appropriate but modest response of hgb.  - Consider outpatient monitor at DC to see if patient has recurrence of afib. May need anticoagulation in the further if he has recurrence of Afib and if hemoglobin stabilizes. Will coordinate with primary team prior to dismissal.  Osteomyelitis S/p hardware removal -  Infectious disease following - Plan for 6 weeks of IV daptomycin and meropenem   Acute on chronic hypoxic respiratory failure HFpEF COPD HCAP? - Echo this admission with EF 60-65%, moderate LVH, elevated LVEDP, normal RV systolic function  - CXR 32/44 with moderate bilateral lower lung predominant interstitial opacities with may represent pulmonary edema vs atypical infection. CXR 11/2 with similar lower lung opacities that are likely due to pulmonary edema  - Patient was started on IV lasix 80 mg BID, appropriate output. Weights inaccurate. - Renal function stable/improving. Creatinine was up to 3.78 on 10/25. Was 3.14 yesterday and is now 3.02>>2.99. With improvement in renal function and appropriate diuresis, continue IV lasix today and reassess daily. With bipap use, primary team would like to administer one dose of metolazone to aggressively improve respiratory status.  - baseline O2 requirements of 3L [place]. Intermittently requiring Bi-PAP. Patient did have an episode of SOB 11/3  and required Bi-PAP. Lung fields are clearing with diuresis and BD. - Patient has duo-neb treatments ordered and PRN albuterol. Also has incentive spirometer.    CKD IV - follows with nephrology as an outpatient  - baseline Cr around 3-3.5 - follow BMET daily as patient is on diuretics    Anemia - on chronic disease with baseline Hgb of 10-11, Hgb 7.4 > received blood> 8.4> 8.1. - no reports of bleeding - management per primary   For questions or updates, please contact [place] Please  consult [email or web address] for contact info under        Signed, Elouise Munroe, MD  04/08/2022, 8:12 AM

## 2022-04-08 NOTE — Progress Notes (Signed)
Activity: Transferred from chair to bed Level of Assistance: +2 (takes two people) Assistive Device: MaxiMove LLE Weight Bearing: NWB Activity Response: Tolerated well   Pt tolerated well and sitting in a recliner this time, call bell within reach, oxygen saturation maintained 95-98 via [place] '@4l'$   [place] Holter, Therapist, sports

## 2022-04-08 NOTE — Progress Notes (Addendum)
PROGRESS NOTE    Eric Scott  GNF:[id number] DOB: [birth year] DOA: 03/28/2022 PCP: [name], MD    No chief complaint on file.   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 Sharol Given after referral from PCP and had exposed hardware He was admitted by Dr. Due to 10/25 underwent left ankle hardware removal with partial distal fibular excision application of 95 cm Kerecis micro powder and local tissue arrangement 15 X3   ID saw the patient for antibiotic recommendations   Postoperatively he was planned for discharge but then developed shortness of breath on 10/29 and hospitalist service was contacted He was placed on BiPAP sent to stepdown unit    Assessment & Plan:   Principal Problem:   Leg wound, left, sequela Active Problems:   Acute on chronic respiratory failure with hypoxia ([name])   Chronic pain (back, legs)   Stage 4 chronic kidney disease ([place])   Type 2 diabetes mellitus with stage 4 chronic kidney disease, with [place]-term current use of insulin ([name])   HTN (hypertension)   Mood disorder ([name])   PAF (paroxysmal atrial fibrillation) ([name])   Leg wound, left, 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 -Line was placed 10/31 . -Ortho planning to remove wound VAC dressing before discharge.   Acute on chronic respiratory failure with hypoxia ([name]) COPD  exacerbation -Patient with COPD, on 3L home O2 -Continue home Breo, Duonebs -Multiple opacities on imaging most likely related to volume overload than actual infection given no leukocytosis, no fever but he is empirically covered with IV antibiotics for his osteomyelitis. -Remains with increased oxygen requirement .  Has been gradually improving with diuresis, he is on 5 L oxygen, still needing BiPAP as needed. -All volume status improving with IV diuresis, will give extra dose of metolazone today -Was encouraged to use incentive spirometry and flutter valve -Remains on IV steroids  COPD exacerbation -Started on IV Solu-Medrol  Atrial fibrillation, new diagnosis  -Cardiology input greatly appreciated , . -At this point anticoagulation risks felt to outweigh benefits.  Recurrence, likely will need heart monitor on discharge.  Anemia of chronic kidney disease -Due to 1 unit PRBC 11/4. -Panel significant for normal ferritin, but low saturation and iron level, would like to avoid IV iron in the setting of infection, so we will start on p.o. supplements, he would likely benefit from Aranesp in the future once iron storage is repleted .  Hypertension -Blood pressure remains elevated but will decrease his metoprolol given bradycardia, continue with amlodipine, continue with diuresis (receiving extra dose of Zaroxolyn today), will start on scheduled hydralazine  Acute on chronic diastolic CHF -Volume overload on imaging, and significant oxygen requirement. -See above discussion regarding diuresis  Chronic pain (back, legs) -He has chronic pain in addition to acute pain -Cannot review PDMP since he is in a SNF -Continue Norco, Neurontin or as per orthopedics   Stage 4 chronic kidney disease (  [place]) -Appears to be stable at this time -Attempt to avoid nephrotoxic medications -Recheck BMP periodically -Continue calcitriol -Continue colchicine for gout   Mood disorder ([place]) -Continue  fluoxetine, alprazolam   HTN (hypertension) -Continue home amlodipine, metoprolol   Type 2 diabetes mellitus with stage 4 chronic kidney disease, with [place]-term current use of insulin ([name]) -A1c is 5.8, indicating good control -Continue glargine -Cover with moderate-scale SSI   COPD -Some minimal wheezing today, will start on low-dose steroids and nebs treatment     DVT prophylaxis: [place] heparin Code Status: (Full) Family Communication: None at bedside  Status is: Inpatient  Consultants:  Ortho >> [facility]    Subjective:  No significant events overnight, no chest pain yesterday  Objective: Vitals:   04/08/22 0300 04/08/22 0737 04/08/22 0751 04/08/22 0810  BP: (!) 119/53 (!) 145/62  (!) 163/67  Pulse: (!) 55 (!) 57  (!) 56  Resp: '13 16  17  '$ Temp: 97.8 F (36.6 C) 97.8 F (36.6 C)  97.6 F (36.4 C)  TempSrc: Oral Oral  Oral  SpO2: 98% 98% 99% 99%  Weight:      Height:        Intake/Output Summary (Last 24 hours) at 04/08/2022 1102 Last data filed at 04/08/2022 0811 Gross per 24 hour  Intake 1195 ml  Output 1500 ml  Net -305 ml   Filed Weights   03/28/22 0641 04/06/22 0638 04/07/22 0618  Weight: 115.7 kg 126.7 kg 128.2 kg    Examination:    Awake Alert, Oriented X 3, No new F.N deficits, Normal affect Symmetrical Chest wall movement, Good air movement bilaterally, CTAB RRR,No Gallops,Rubs or new Murmurs, No Parasternal Heave +ve B.Sounds, Abd Soft, No tenderness, No rebound - guarding or rigidity. No Cyanosis, Clubbing or edema,left  foot connected to wound VAC   Data Reviewed: I have personally reviewed following labs and imaging studies  CBC: Recent Labs  Lab 04/04/22 0435 04/05/22 0341 04/06/22 0636 04/07/22 0547 04/07/22 1600 04/08/22 0418  WBC 8.3 7.7 9.7 6.2  --  7.2  HGB 8.1* 7.7* 8.1* 7.4* 8.7* 8.1*  HCT 25.8* 24.7* 24.1* 22.8* 27.7* 24.9*  MCV 95.6 96.9 93.8 94.2  --  94.0  PLT 210 225 234 236  --  888    Basic Metabolic  Panel: Recent Labs  Lab 04/04/22 0435 04/05/22 0341 04/06/22 0636 04/07/22 0547 04/08/22 0418  NA 140 143 141 144 137  K 3.8 4.0 3.7 4.6 4.9  CL 107 110 107 110 102  CO2 21* '23 23 23 22  '$ GLUCOSE 124* 111* 137* 189* 286*  BUN 64* 61* 60* 66* 74*  CREATININE 3.29* 3.14* 3.02* 3.01* 2.99*  CALCIUM 8.5* 8.5* 8.3* 8.9 8.4*    GFR: Estimated Creatinine Clearance: 31.3 mL/min (A) (by C-G formula based on SCr of 2.99 mg/dL (H)).  Liver Function Tests: No results for input(s): "AST", "ALT", "ALKPHOS", "BILITOT", "PROT", "ALBUMIN" in the last 168 hours.  CBG: Recent Labs  Lab 04/07/22 1105 04/07/22 1618 04/07/22 2138 04/08/22 0347 04/08/22 0630  GLUCAP 174* 221* 252* 263* 240*     No results found for this or any previous visit (from the past 240 hour(s)).        Radiology Studies: No results found.      Scheduled Meds:  ALPRAZolam  0.5 mg Oral QHS   amiodarone  200 mg Oral BID   amLODipine  10 mg Oral Daily   aspirin EC  81 mg Oral Q breakfast  calcitRIOL  0.25 mcg Oral Daily   Chlorhexidine Gluconate Cloth  6 each Topical Daily   colchicine  0.6 mg Oral Daily   cyanocobalamin  1,000 mcg Oral Daily   docusate sodium  100 mg Oral BID   feeding supplement  237 mL Oral TID BM   ferrous sulfate  325 mg Oral TID WC   FLUoxetine  40 mg Oral Daily   fluticasone furoate-vilanterol  1 puff Inhalation Daily   furosemide  80 mg Intravenous BID   gabapentin  300 mg Oral QHS   heparin injection (subcutaneous)  5,000 Units Subcutaneous Q8H   insulin aspart  0-15 Units Subcutaneous TID WC   insulin aspart  4 Units Subcutaneous TID WC   insulin glargine-yfgn  10 Units Subcutaneous QHS   methylPREDNISolone (SOLU-MEDROL) injection  80 mg Intravenous Daily   metolazone  2.5 mg Oral Once   metoprolol tartrate  25 mg Oral BID   pantoprazole  40 mg Oral Daily   senna-docusate  2 tablet Oral BID   tamsulosin  0.4 mg Oral QPC supper   Continuous Infusions:  sodium  chloride Stopped (04/01/22 0827)   DAPTOmycin (CUBICIN) 750 mg in sodium chloride 0.9 % IVPB Stopped (04/07/22 1733)   meropenem (MERREM) IV 1 g (04/08/22 0436)   methocarbamol (ROBAXIN) IV       LOS: 10 days       Phillips Climes, MD [facility]   To contact the attending provider between 7A-7P or the covering provider during after hours 7P-7A, please log into the web site [email or web address] and access using universal [place] password for that web site. If you do not have the password, please call the hospital operator.  04/08/2022, 11:02 AM

## 2022-04-09 DIAGNOSIS — I10 Essential (primary) hypertension: Secondary | ICD-10-CM | POA: Diagnosis not present

## 2022-04-09 DIAGNOSIS — S81802S Unspecified open wound, left lower leg, sequela: Secondary | ICD-10-CM | POA: Diagnosis not present

## 2022-04-09 DIAGNOSIS — I480 Paroxysmal atrial fibrillation: Secondary | ICD-10-CM | POA: Diagnosis not present

## 2022-04-09 DIAGNOSIS — N184 Chronic kidney disease, stage 4 (severe): Secondary | ICD-10-CM | POA: Diagnosis not present

## 2022-04-09 LAB — BASIC METABOLIC PANEL
Anion gap: 12 (ref 5–15)
BUN: 81 mg/dL — ABNORMAL HIGH (ref 8–23)
CO2: 24 mmol/L (ref 22–32)
Calcium: 8.6 mg/dL — ABNORMAL LOW (ref 8.9–10.3)
Chloride: 102 mmol/L (ref 98–111)
Creatinine, Ser: 3.03 mg/dL — ABNORMAL HIGH (ref 0.61–1.24)
GFR, Estimated: 21 mL/min — ABNORMAL LOW (ref >60)
Glucose, Bld: 293 mg/dL — ABNORMAL HIGH (ref 70–99)
Potassium: 5 mmol/L (ref 3.5–5.1)
Sodium: 138 mmol/L (ref 135–145)

## 2022-04-09 LAB — MAGNESIUM: Magnesium: 1.9 mg/dL (ref 1.7–2.4)

## 2022-04-09 LAB — CBC
HCT: 25.2 % — ABNORMAL LOW (ref 39.0–52.0)
Hemoglobin: 8.4 g/dL — ABNORMAL LOW (ref 13.0–17.0)
MCH: 31.1 pg (ref 26.0–34.0)
MCHC: 33.3 g/dL (ref 30.0–36.0)
MCV: 93.3 fL (ref 80.0–100.0)
Platelets: 244 10*3/uL (ref 150–400)
RBC: 2.7 MIL/uL — ABNORMAL LOW (ref 4.22–5.81)
RDW: 14.1 % (ref 11.5–15.5)
WBC: 9.6 10*3/uL (ref 4.0–10.5)
nRBC: 0 % (ref 0.0–0.2)

## 2022-04-09 LAB — GLUCOSE, CAPILLARY
Glucose-Capillary: 245 mg/dL — ABNORMAL HIGH (ref 70–99)
Glucose-Capillary: 169 mg/dL — ABNORMAL HIGH (ref 70–99)
Glucose-Capillary: 276 mg/dL — ABNORMAL HIGH (ref 70–99)
Glucose-Capillary: 253 mg/dL — ABNORMAL HIGH (ref 70–99)

## 2022-04-09 MED ORDER — INSULIN GLARGINE-YFGN 100 UNIT/ML ~~LOC~~ SOLN
18.0000 [IU] | Freq: Every day | SUBCUTANEOUS | Status: DC
  Administered 2022-04-09 – 2022-04-10 (×2): 18 [IU] via SUBCUTANEOUS
  Filled 2022-04-09 (×3): qty 0.18

## 2022-04-09 MED ORDER — INSULIN GLARGINE-YFGN 100 UNIT/ML ~~LOC~~ SOLN
6.0000 [IU] | Freq: Once | SUBCUTANEOUS | Status: AC
  Administered 2022-04-09: 6 [IU] via SUBCUTANEOUS
  Filled 2022-04-09: qty 0.06

## 2022-04-09 MED ORDER — PREDNISONE 20 MG PO TABS
20.0000 mg | ORAL_TABLET | Freq: Every day | ORAL | Status: AC
  Administered 2022-04-09 – 2022-04-11 (×3): 20 mg via ORAL
  Filled 2022-04-09 (×3): qty 1

## 2022-04-09 MED ORDER — INSULIN ASPART 100 UNIT/ML IJ SOLN
6.0000 [IU] | Freq: Three times a day (TID) | INTRAMUSCULAR | Status: DC
  Administered 2022-04-09 – 2022-04-11 (×4): 6 [IU] via SUBCUTANEOUS

## 2022-04-09 MED ORDER — METOLAZONE 2.5 MG PO TABS
2.5000 mg | ORAL_TABLET | Freq: Once | ORAL | Status: AC
  Administered 2022-04-09: 2.5 mg via ORAL
  Filled 2022-04-09: qty 1

## 2022-04-09 MED ORDER — LOPERAMIDE HCL 2 MG PO CAPS
2.0000 mg | ORAL_CAPSULE | ORAL | Status: DC | PRN
  Administered 2022-04-09: 2 mg via ORAL
  Filled 2022-04-09 (×2): qty 1

## 2022-04-09 NOTE — Plan of Care (Signed)
  Problem: Clinical Measurements: Goal: Respiratory complications will improve Outcome: Progressing   Problem: Activity: Goal: Risk for activity intolerance will decrease Outcome: Progressing   Problem: Nutrition: Goal: Adequate nutrition will be maintained Outcome: Progressing   Problem: Coping: Goal: Level of anxiety will decrease Outcome: Progressing   Problem: Pain Managment: Goal: General experience of comfort will improve Outcome: Progressing   

## 2022-04-09 NOTE — Progress Notes (Signed)
Rounding Note    Patient Name: Eric Scott Date of Encounter: 04/09/2022  [place] Cardiologist: Jenkins Rouge, MD   Subjective   Subjectively does not feel he has improved however did not request bipap yesterday, suspect adequate diuresis yesterday has helped. Now in atrial flutter.  Inpatient Medications    Scheduled Meds:  ALPRAZolam  0.5 mg Oral QHS   amiodarone  200 mg Oral BID   amLODipine  10 mg Oral Daily   aspirin EC  81 mg Oral Q breakfast   calcitRIOL  0.25 mcg Oral Daily   Chlorhexidine Gluconate Cloth  6 each Topical Daily   colchicine  0.6 mg Oral Daily   cyanocobalamin  1,000 mcg Oral Daily   docusate sodium  100 mg Oral BID   feeding supplement  237 mL Oral TID BM   ferrous sulfate  325 mg Oral TID WC   FLUoxetine  40 mg Oral Daily   fluticasone furoate-vilanterol  1 puff Inhalation Daily   furosemide  80 mg Intravenous BID   gabapentin  300 mg Oral QHS   heparin injection (subcutaneous)  5,000 Units Subcutaneous Q8H   hydrALAZINE  25 mg Oral Q6H   insulin aspart  0-15 Units Subcutaneous TID WC   insulin aspart  4 Units Subcutaneous TID WC   insulin glargine-yfgn  10 Units Subcutaneous QHS   metoprolol tartrate  25 mg Oral BID   pantoprazole  40 mg Oral Daily   predniSONE  20 mg Oral Q breakfast   senna-docusate  2 tablet Oral BID   tamsulosin  0.4 mg Oral QPC supper   Continuous Infusions:  sodium chloride Stopped (04/01/22 0827)   DAPTOmycin (CUBICIN) 750 mg in sodium chloride 0.9 % IVPB Stopped (04/07/22 1733)   meropenem (MERREM) IV 1 g (04/09/22 0501)   methocarbamol (ROBAXIN) IV     PRN Meds: acetaminophen, albuterol, bisacodyl, HYDROcodone-acetaminophen, HYDROcodone-acetaminophen, magnesium citrate, methocarbamol **OR** methocarbamol (ROBAXIN) IV, metoCLOPramide **OR** metoCLOPramide (REGLAN) injection, nitroGLYCERIN, ondansetron **OR** ondansetron (ZOFRAN) IV, mouth rinse, polyethylene glycol   Vital Signs    Vitals:    04/08/22 2337 04/08/22 2346 04/09/22 0428 04/09/22 0610  BP: (!) 153/84 139/79 (!) 151/65   Pulse: 72 66 (!) 55   Resp: '14 16 17   '$ Temp:  97.6 F (36.4 C) 97.7 F (36.5 C)   TempSrc:  Oral Axillary   SpO2: 99% 98% 98%   Weight:    129.4 kg  Height:        Intake/Output Summary (Last 24 hours) at 04/09/2022 1006 Last data filed at 04/09/2022 0430 Gross per 24 hour  Intake 1640 ml  Output 1775 ml  Net -135 ml      04/09/2022    6:10 AM 04/07/2022    6:18 AM 04/06/2022    6:38 AM  Last 3 Weights  Weight (lbs) 285 lb 4.4 oz 282 lb 10.1 oz 279 lb 5.2 oz  Weight (kg) 129.4 kg 128.2 kg 126.7 kg      Telemetry    Atrial flutter SVR and CVR- Personally Reviewed  ECG    No new tracings- Personally Reviewed  Physical Exam   GEN: No acute distress. Sitting in the bed  Neck: No JVD Cardiac: iRRR, faint SEM  Respiratory: diminished in bases. Wearing [place] with supplemental oxygen  GI: Soft, nontender, non-distended  MS: no RLE edema. Left lower extremity covered in bandages with wound vac in place Neuro:  Nonfocal  Psych: Normal affect   Labs  High Sensitivity Troponin:   Recent Labs  Lab 04/06/22 1438 04/06/22 1629  TROPONINIHS 13 12     Chemistry Recent Labs  Lab 04/07/22 0547 04/08/22 0418 04/09/22 0155  NA 144 137 138  K 4.6 4.9 5.0  CL 110 102 102  CO2 '23 22 24  '$ GLUCOSE 189* 286* 293*  BUN 66* 74* 81*  CREATININE 3.01* 2.99* 3.03*  CALCIUM 8.9 8.4* 8.6*  MG  --   --  1.9  GFRNONAA 21* 22* 21*  ANIONGAP '11 13 12    '$ Lipids No results for input(s): "CHOL", "TRIG", "HDL", "LABVLDL", "LDLCALC", "CHOLHDL" in the last 168 hours.  Hematology Recent Labs  Lab 04/07/22 0547 04/07/22 1600 04/08/22 0418 04/09/22 0155  WBC 6.2  --  7.2 9.6  RBC 2.42*  --  2.65* 2.70*  HGB 7.4* 8.7* 8.1* 8.4*  HCT 22.8* 27.7* 24.9* 25.2*  MCV 94.2  --  94.0 93.3  MCH 30.6  --  30.6 31.1  MCHC 32.5  --  32.5 33.3  RDW 14.4  --  14.4 14.1  PLT 236  --  249 244   Thyroid  No results for input(s): "TSH", "FREET4" in the last 168 hours.  BNP Recent Labs  Lab 04/04/22 0435  BNP 985.2*    DDimer No results for input(s): "DDIMER" in the last 168 hours.   Radiology    No results found.  Cardiac Studies   Echocardiogram 04/05/22 1. Left ventricular ejection fraction, by estimation, is 60 to 65%. The  left ventricle has normal function. The left ventricle has no regional  wall motion abnormalities. The left ventricular internal cavity size was  mildly dilated. There is moderate  concentric left ventricular hypertrophy. Left ventricular diastolic  parameters are indeterminate. Elevated left ventricular end-diastolic  pressure.   2. Right ventricular systolic function is normal. The right ventricular  size is moderately enlarged. There is moderately elevated pulmonary artery  systolic pressure. The estimated right ventricular systolic pressure is  24.0 mmHg.   3. The mitral valve is normal in structure. Trivial mitral valve  regurgitation. No evidence of mitral stenosis.   4. The aortic valve is tricuspid. Aortic valve regurgitation is trivial.  Aortic valve sclerosis is present, with no evidence of aortic valve  stenosis.   5. The inferior vena cava is dilated in size with >50% respiratory  variability, suggesting right atrial pressure of 8 mmHg.   Patient Profile     71 y.o. male with a hx of HTN, HFpEF, COPD onn 3L chronically,  CKD IV, DM, and anemia seen for afib RVR, now in SR, in setting of osteomyelitis and acute decompensated HFpEF.  Assessment & Plan    New onset atrial fibrillation with RVR - Developed in the setting of osteomyelitis, CHF.  Episode 11/2 lasting from 3:47 AM until 10:20 AM with conversion back to sinus rhythm. Per telemetry, patient now has atrial flutter as of 04/08/22. - PO amiodarone - metoprolol 25 mg BID.  - CHADS-VASc 5, hemoglobin low, but now stable. Warrants anticoagulation now with proven recurrence of atrial  arrhythmia. If hemoglobin remains stable, would consider anticoagulation per pharmacy consult in setting of renal dysfunction.   Osteomyelitis S/p hardware removal - Infectious disease following - Plan for 6 weeks of IV daptomycin and meropenem   Acute on chronic hypoxic respiratory failure HFpEF COPD HCAP? - Echo this admission with EF 60-65%, moderate LVH, elevated LVEDP, normal RV systolic function  - CXR 97/35 with moderate bilateral lower lung predominant  interstitial opacities with may represent pulmonary edema vs atypical infection. CXR 11/2 with similar lower lung opacities that are likely due to pulmonary edema  - Patient was started on IV lasix 80 mg BID, appropriate output. Weights inaccurate. Cr stable. Continue iv lasix today. Received metolazone yesterday. - baseline O2 requirements of 3L [place]. Intermittently requiring Bi-PAP. Lung fields are clearing with diuresis and BD. Improved in last 24 hr. - Patient has duo-neb treatments ordered and PRN albuterol. Also has incentive spirometer.   CKD IV - follows with nephrology as an outpatient  - baseline Cr around 3-3.5 - follow BMET daily as patient is on diuretics    Anemia - on chronic disease with baseline Hgb of 10-11, Hgb 7.4 > received blood. HB has been relatively stable around 8.5. May need to consider anticoagulation with elevated CHADS2vasc score, no source of bleeding identified. - no reports of bleeding - management per primary   For questions or updates, please contact [place] Please consult [email or web address] for contact info under        Signed, Elouise Munroe, MD  04/09/2022, 10:06 AM

## 2022-04-09 NOTE — Inpatient Diabetes Management (Signed)
Inpatient Diabetes Program Recommendations  AACE/ADA: New Consensus Statement on Inpatient Glycemic Control (2015)  Target Ranges:  Prepandial:   less than 140 mg/dL      Peak postprandial:   less than 180 mg/dL (1-2 hours)      Critically ill patients:  140 - 180 mg/dL    Latest Reference Range & Units 04/08/22 06:30 04/08/22 11:25 04/08/22 17:14 04/08/22 21:32  Glucose-Capillary 70 - 99 mg/dL 240 (H)  9 units Novolog  238 (H)  9 units Novolog  361 (H)  19 units Novolog  304 (H)    10 units Semglee  (H): Data is abnormally high  Latest Reference Range & Units 04/09/22 06:44  Glucose-Capillary 70 - 99 mg/dL 245 (H)  9 units Novolog   (H): Data is abnormally high    Home DM Meds: Lantus 32 units QHS     Novolog 2-10 units TID ac/hs   Current Orders: Semglee 10 units QHS    Novolog 0-15 units TID    Novolog 4 units TID with meals      MD- Note Solumedrol stopped--last dose given yest at 9am--Starting Prednisone 20 mg daily this AM   AM CBG remains elevated  Please consider:  1. Increase the Semglee to 15 units QHS (50% home dose)  2. Increase the Novolog Meal Coverage to 6 units TID with meals    --Will follow patient during hospitalization--  Wyn Quaker RN, MSN, [place] Diabetes Coordinator Inpatient Glycemic Control Team Team Pager: [phone] (8a-5p)

## 2022-04-09 NOTE — Progress Notes (Signed)
PHARMACY CONSULT NOTE FOR:  OUTPATIENT  PARENTERAL ANTIBIOTIC THERAPY (OPAT)  Informational only as the patient is planned to discharge to SNF and will receive antibiotics there  Indication: Polymicrobial L-ankle osteo Regimen: Daptomycin 750 mg IV every 48 hours and Meropenem 1000 mg IV every 12 hours End date: 05/09/22  IV antibiotic discharge orders are pended. To discharging provider:  please sign these orders via discharge navigator,  Select New Orders & click on the button choice - Manage This Unsigned Work.     Thank you for allowing pharmacy to be a part of this patient's care.  Luisa Hart, PharmD, BCPS Clinical Pharmacist 04/09/2022 10:45 AM   Please refer to AMION for pharmacy phone number

## 2022-04-09 NOTE — Progress Notes (Signed)
Patient's wound vac turned off, dressing removed, revealing a very foul odor. Leg cleaned with soap and water, avoiding area of stitches. Area of stitches looks clean and approximated.   4 x 4 applied over surgical wound area and wrapped with an Ace bandage.  Patient tolerated this well.

## 2022-04-09 NOTE — Progress Notes (Signed)
PROGRESS NOTE    Eric Scott  GYK:[id number] DOB: Oct 25, 1950 DOA: 03/28/2022 PCP: [name], MD    No chief complaint on file.   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 Sharol Given after referral from PCP and had exposed hardware He was admitted by Dr. Due to 10/25 underwent left ankle hardware removal with partial distal fibular excision application of 95 cm Kerecis micro powder and local tissue arrangement 15 X3   ID saw the patient for antibiotic recommendations   Postoperatively he was planned for discharge but then developed shortness of breath on 10/29 and hospitalist service was contacted He was placed on BiPAP sent to stepdown unit    Assessment & Plan:   Principal Problem:   Leg wound, left, sequela Active Problems:   Acute on chronic respiratory failure with hypoxia ([name])   Chronic pain (back, legs)   Stage 4 chronic kidney disease ([place])   Type 2 diabetes mellitus with stage 4 chronic kidney disease, with [place]-term current use of insulin ([name])   HTN (hypertension)   Mood disorder ([name])   PAF (paroxysmal atrial fibrillation) ([name])   Leg wound, left, 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 -Line was placed 10/31 . -Ortho planning to remove wound VAC dressing before discharge.   Acute on chronic respiratory failure with hypoxia ([name]) COPD  exacerbation -Patient with COPD, on 3L home O2 -Continue home Breo, Duonebs -Multiple opacities on imaging most likely related to volume overload than actual infection given no leukocytosis, no fever but he is empirically covered with IV antibiotics for his osteomyelitis. -[place] requirement has improved morning, he is on 4 L, he did use BiPAP overnight, but patient report he did not have dyspnea for it, so I will DC BiPAP  -All volume status improving with IV diuresis, another dose of metolazone today.   -Was encouraged to use incentive spirometry and flutter valve -Is on IV steroids, will be transitioned to oral prednisone.  COPD exacerbation -Addition IV Solu-Medrol to p.o. prednisone  Atrial fibrillation, new diagnosis  -Cardiology input greatly appreciated , . -At this point anticoagulation risks felt to outweigh benefits.  Recurrence, likely will need heart monitor on discharge.  Anemia of chronic kidney disease -1 unit PRBC 11/4. -Panel significant for normal ferritin, but low saturation and iron level, would like to avoid IV iron in the setting of infection, so we will start on p.o. supplements, he would likely benefit from Aranesp in the future once iron storage is repleted .  Hypertension -.continue  current regimen, added hydralazine for better blood pressure control - metoprolol  decreased 11/5 due to bradycardia   Acute on chronic diastolic CHF -Volume overload on imaging, and significant oxygen requirement. -See above discussion regarding diuresis  Chronic pain (back, legs) -He has chronic pain in addition to acute pain -Cannot review PDMP since he is in a SNF -Continue Norco, Neurontin or as per orthopedics  Stage 4 chronic kidney disease ([name]) -Appears to be stable at this time -Attempt to avoid nephrotoxic medications -Recheck BMP periodically -Continue calcitriol -Continue colchicine for gout   Mood disorder ([name]) -Continue fluoxetine, alprazolam   Type 2  diabetes mellitus with stage 4 chronic kidney disease, with [place]-term current use of insulin ([name]) -A1c is 5.8, indicating good control -CBG elevated recently, most likely due to steroids now is being tapered hopefully it will improve, crease his Semglee to 18 units, increase Premeal NovoLog 6 units twice daily, continue with insulin sliding scale, (home dose Lantus was 32 units daily) -Continue glargine  COPD -Some minimal wheezing today, will start on low-dose steroids and nebs treatment     DVT prophylaxis: [place] heparin Code Status: (Full) Family Communication: None at bedside  Status is: Inpatient  Consultants:  Ortho >> [facility] Cardiology    Subjective:  No significant events overnight, no chest pain, patient had BiPAP connected at nighttime.  Objective: Vitals:   04/08/22 2337 04/08/22 2346 04/09/22 0428 04/09/22 0610  BP: (!) 153/84 139/79 (!) 151/65   Pulse: 72 66 (!) 55   Resp: '14 16 17   '$ Temp:  97.6 F (36.4 C) 97.7 F (36.5 C)   TempSrc:  Oral Axillary   SpO2: 99% 98% 98%   Weight:    129.4 kg  Height:        Intake/Output Summary (Last 24 hours) at 04/09/2022 1134 Last data filed at 04/09/2022 0430 Gross per 24 hour  Intake 1640 ml  Output 1775 ml  Net -135 ml   Filed Weights   04/06/22 0638 04/07/22 0618 04/09/22 0610  Weight: 126.7 kg 128.2 kg 129.4 kg    Examination:    Awake Alert, Oriented X 3, frail, chronically ill-appearing Symmetrical Chest wall movement, diminished  air entry at the bases Irregular, No Parasternal Heave +ve B.Sounds, Abd Soft, No tenderness, No rebound - guarding or rigidity. No Cyanosis, Clubbing or edema,left  foot connected to wound VAC   Data Reviewed: I have personally reviewed following labs and imaging studies  CBC: Recent Labs  Lab 04/05/22 0341 04/06/22 0636 04/07/22 0547 04/07/22 1600 04/08/22 0418 04/09/22 0155  WBC 7.7 9.7 6.2  --  7.2 9.6  HGB 7.7* 8.1* 7.4* 8.7* 8.1* 8.4*  HCT 24.7* 24.1* 22.8*  27.7* 24.9* 25.2*  MCV 96.9 93.8 94.2  --  94.0 93.3  PLT 225 234 236  --  249 381    Basic Metabolic Panel: Recent Labs  Lab 04/05/22 0341 04/06/22 0636 04/07/22 0547 04/08/22 0418 04/09/22 0155  NA 143 141 144 137 138  K 4.0 3.7 4.6 4.9 5.0  CL 110 107 110 102 102  CO2 '23 23 23 22 24  '$ GLUCOSE 111* 137* 189* 286* 293*  BUN 61* 60* 66* 74* 81*  CREATININE 3.14* 3.02* 3.01* 2.99* 3.03*  CALCIUM 8.5* 8.3* 8.9 8.4* 8.6*  MG  --   --   --   --  1.9    GFR: Estimated Creatinine Clearance: 31.1 mL/min (A) (by C-G formula based on SCr of 3.03 mg/dL (H)).  Liver Function Tests: No results for input(s): "AST", "ALT", "ALKPHOS", "BILITOT", "PROT", "ALBUMIN" in the last 168 hours.  CBG: Recent Labs  Lab 04/08/22 0630 04/08/22 1125 04/08/22 1714 04/08/22 2132 04/09/22 0644  GLUCAP 240* 238* 361* 304* 245*     No results found for this or any previous visit (from the past 240 hour(s)).        Radiology Studies: No results  found.      Scheduled Meds:  ALPRAZolam  0.5 mg Oral QHS   amiodarone  200 mg Oral BID   amLODipine  10 mg Oral Daily   aspirin EC  81 mg Oral Q breakfast   calcitRIOL  0.25 mcg Oral Daily   Chlorhexidine Gluconate Cloth  6 each Topical Daily   colchicine  0.6 mg Oral Daily   cyanocobalamin  1,000 mcg Oral Daily   docusate sodium  100 mg Oral BID   feeding supplement  237 mL Oral TID BM   ferrous sulfate  325 mg Oral TID WC   FLUoxetine  40 mg Oral Daily   fluticasone furoate-vilanterol  1 puff Inhalation Daily   furosemide  80 mg Intravenous BID   gabapentin  300 mg Oral QHS   heparin injection (subcutaneous)  5,000 Units Subcutaneous Q8H   hydrALAZINE  25 mg Oral Q6H   insulin aspart  0-15 Units Subcutaneous TID WC   insulin aspart  4 Units Subcutaneous TID WC   insulin glargine-yfgn  10 Units Subcutaneous QHS   metolazone  2.5 mg Oral Once   metoprolol tartrate  25 mg Oral BID   pantoprazole  40 mg Oral Daily   predniSONE  20 mg  Oral Q breakfast   senna-docusate  2 tablet Oral BID   tamsulosin  0.4 mg Oral QPC supper   Continuous Infusions:  sodium chloride Stopped (04/01/22 0827)   DAPTOmycin (CUBICIN) 750 mg in sodium chloride 0.9 % IVPB Stopped (04/07/22 1733)   meropenem (MERREM) IV 1 g (04/09/22 0501)   methocarbamol (ROBAXIN) IV       LOS: 11 days       Phillips Climes, MD [facility]   To contact the attending provider between 7A-7P or the covering provider during after hours 7P-7A, please log into the web site [email or web address] and access using universal [place] password for that web site. If you do not have the password, please call the hospital operator.  04/09/2022, 11:34 AM

## 2022-04-10 ENCOUNTER — Telehealth (HOSPITAL_COMMUNITY): Payer: Self-pay | Admitting: Pharmacy Technician

## 2022-04-10 ENCOUNTER — Other Ambulatory Visit (HOSPITAL_COMMUNITY): Payer: Self-pay

## 2022-04-10 DIAGNOSIS — I10 Essential (primary) hypertension: Secondary | ICD-10-CM | POA: Diagnosis not present

## 2022-04-10 DIAGNOSIS — N184 Chronic kidney disease, stage 4 (severe): Secondary | ICD-10-CM | POA: Diagnosis not present

## 2022-04-10 DIAGNOSIS — J9621 Acute and chronic respiratory failure with hypoxia: Secondary | ICD-10-CM | POA: Diagnosis not present

## 2022-04-10 DIAGNOSIS — S81802S Unspecified open wound, left lower leg, sequela: Secondary | ICD-10-CM | POA: Diagnosis not present

## 2022-04-10 LAB — CBC
HCT: 27.3 % — ABNORMAL LOW (ref 39.0–52.0)
Hemoglobin: 9 g/dL — ABNORMAL LOW (ref 13.0–17.0)
MCH: 30.6 pg (ref 26.0–34.0)
MCHC: 33 g/dL (ref 30.0–36.0)
MCV: 92.9 fL (ref 80.0–100.0)
Platelets: 262 10*3/uL (ref 150–400)
RBC: 2.94 MIL/uL — ABNORMAL LOW (ref 4.22–5.81)
RDW: 14.2 % (ref 11.5–15.5)
WBC: 12.1 10*3/uL — ABNORMAL HIGH (ref 4.0–10.5)
nRBC: 0 % (ref 0.0–0.2)

## 2022-04-10 LAB — GLUCOSE, CAPILLARY
Glucose-Capillary: 183 mg/dL — ABNORMAL HIGH (ref 70–99)
Glucose-Capillary: 311 mg/dL — ABNORMAL HIGH (ref 70–99)
Glucose-Capillary: 335 mg/dL — ABNORMAL HIGH (ref 70–99)
Glucose-Capillary: 304 mg/dL — ABNORMAL HIGH (ref 70–99)

## 2022-04-10 LAB — BASIC METABOLIC PANEL
Anion gap: 10 (ref 5–15)
BUN: 77 mg/dL — ABNORMAL HIGH (ref 8–23)
CO2: 24 mmol/L (ref 22–32)
Calcium: 7.8 mg/dL — ABNORMAL LOW (ref 8.9–10.3)
Chloride: 106 mmol/L (ref 98–111)
Creatinine, Ser: 2.88 mg/dL — ABNORMAL HIGH (ref 0.61–1.24)
GFR, Estimated: 23 mL/min — ABNORMAL LOW (ref >60)
Glucose, Bld: 225 mg/dL — ABNORMAL HIGH (ref 70–99)
Potassium: 4.3 mmol/L (ref 3.5–5.1)
Sodium: 140 mmol/L (ref 135–145)

## 2022-04-10 MED ORDER — COLCHICINE 0.6 MG PO TABS: 0.3000 mg | ORAL_TABLET | Freq: Every day | ORAL | Status: AC

## 2022-04-10 MED ORDER — ALPRAZOLAM 0.5 MG PO TABS: 0.5000 mg | ORAL_TABLET | Freq: Every day | ORAL | 0 refills | Status: DC

## 2022-04-10 MED ORDER — METHYLPREDNISOLONE SODIUM SUCC 125 MG IJ SOLR
80.0000 mg | Freq: Once | INTRAMUSCULAR | Status: AC
  Administered 2022-04-10: 80 mg via INTRAVENOUS
  Filled 2022-04-10: qty 2

## 2022-04-10 MED ORDER — HYDROCODONE-ACETAMINOPHEN 7.5-325 MG PO TABS: 1.0000 | ORAL_TABLET | Freq: Four times a day (QID) | ORAL | 0 refills | Status: DC

## 2022-04-10 MED ORDER — TORSEMIDE 20 MG PO TABS: 80.0000 mg | ORAL_TABLET | Freq: Two times a day (BID) | ORAL | Status: DC

## 2022-04-10 MED ORDER — LANTUS 100 UNIT/ML ~~LOC~~ SOLN: 22.0000 [IU] | Freq: Every day | SUBCUTANEOUS | 11 refills | Status: DC

## 2022-04-10 MED ORDER — MEROPENEM IV (FOR PTA / DISCHARGE USE ONLY): 1.0000 g | Freq: Two times a day (BID) | INTRAVENOUS | 0 refills | Status: DC

## 2022-04-10 MED ORDER — APIXABAN 5 MG PO TABS
5.0000 mg | ORAL_TABLET | Freq: Two times a day (BID) | ORAL | Status: DC
  Administered 2022-04-10 – 2022-04-11 (×3): 5 mg via ORAL
  Filled 2022-04-10 (×3): qty 1

## 2022-04-10 MED ORDER — APIXABAN 5 MG PO TABS: 5.0000 mg | ORAL_TABLET | Freq: Two times a day (BID) | ORAL | Status: DC

## 2022-04-10 MED ORDER — ALBUTEROL SULFATE (2.5 MG/3ML) 0.083% IN NEBU: 2.5000 mg | INHALATION_SOLUTION | RESPIRATORY_TRACT | 12 refills | Status: DC | PRN

## 2022-04-10 MED ORDER — ACETAMINOPHEN 325 MG PO TABS: 650.0000 mg | ORAL_TABLET | Freq: Four times a day (QID) | ORAL | 0 refills | Status: AC | PRN

## 2022-04-10 MED ORDER — METOPROLOL TARTRATE 25 MG PO TABS: 25.0000 mg | ORAL_TABLET | Freq: Two times a day (BID) | ORAL | Status: AC

## 2022-04-10 MED ORDER — METOLAZONE 2.5 MG PO TABS
2.5000 mg | ORAL_TABLET | Freq: Every day | ORAL | Status: AC
  Administered 2022-04-10: 2.5 mg via ORAL
  Filled 2022-04-10: qty 1

## 2022-04-10 MED ORDER — DAPTOMYCIN IV (FOR PTA / DISCHARGE USE ONLY): 750.0000 mg | INTRAVENOUS | 0 refills | Status: DC

## 2022-04-10 MED ORDER — AMIODARONE HCL 200 MG PO TABS: ORAL_TABLET | ORAL | Status: DC

## 2022-04-10 MED ORDER — METOLAZONE 2.5 MG PO TABS
2.5000 mg | ORAL_TABLET | Freq: Once | ORAL | Status: AC
  Administered 2022-04-10: 2.5 mg via ORAL
  Filled 2022-04-10: qty 1

## 2022-04-10 MED ORDER — METOLAZONE 2.5 MG PO TABS: 2.5000 mg | ORAL_TABLET | ORAL | Status: DC

## 2022-04-10 NOTE — Progress Notes (Signed)
Mobility Specialist Progress Note    04/10/22 1029  Mobility  Activity Transferred from bed to chair  Level of Assistance +2 (takes two people)  Assistive Device MaxiMove  Activity Response Tolerated well  Mobility Referral Yes  $Mobility charge 1 Mobility   Pre-Mobility: 74 HR, 89% SpO2 Post-Mobility: 69 HR, 93% SpO2  Pt received and agreeable. C/o feeling like he can't breath once in chair. RN present to adjust O2. Left with call bell in reach.   Hildred Alamin Mobility Specialist  Secure Chat Only

## 2022-04-10 NOTE — Telephone Encounter (Signed)
Pharmacy Patient Advocate Encounter  Insurance verification completed.    The patient is insured through [employer] Part D   The patient is currently admitted and ran test claims for the following: Eliquis.  Copays and coinsurance results were relayed to Inpatient clinical team.

## 2022-04-10 NOTE — Discharge Summary (Addendum)
Physician Discharge Summary  KEZIAH DROTAR XIP:[id number] DOB: 29-May-1951 DOA: 03/28/2022  PCP: [name], MD  Admit date: 03/28/2022 Discharge date: 04/11/2022  Admitted From: Home Disposition:  SNF    Left son a voicemail today prior to DC  Recommendations for Outpatient Follow-up:  Was started on Eliquis for A-fib, and amiodarone, dose to be decreased in 8 days Continue with wound care for left ankle for left ankle wound Continue with IV antibiotics, please see ID recommendations below Please keep encouraging to use  incentive spirometry and flutter valve. Patient to follow-up with vascular surgery Dr. Sharol Given in 1 week To follow with ID on 11/21   ID recommendations/antibiotics on discharge  Discharge antibiotics to be given via PICC line:   Per pharmacy protocol    DAPTOMYCIN + MEROPENEM (renally adjusted)     Duration: 6 weeks    End Date: Dec 6th      [place] Per Protocol with [employer] Use: Home health RN for IV administration and teaching, line care and labs.     Labs weekly while on IV antibiotics: _x_ CBC with differential __ BMP **TWICE WEEKLY ON VANCOMYCIN  _x_ CMP _x_ CRP _x_ ESR __ Vancomycin trough TWICE WEEKLY _x_ CK   __ Please pull PIC at completion of IV antibiotics _x_ Please leave PIC in place until doctor has seen patient or been notified   Fax weekly labs to [phone]   Clinic Follow Up Appt: 11/21 @ 10/45 with Dr. 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 Sharol Given after referral from PCP and had exposed hardware He was admitted by Dr. Sharol Given to 10/25 underwent left ankle hardware removal with partial distal fibular excision application of 95 cm Kerecis micro powder and local tissue arrangement 15 X3  ID saw the patient for antibiotic recommendations  Postoperatively he was planned for discharge but then developed shortness of breath on 10/29 and hospitalist service was contacted He was placed on BiPAP sent to stepdown unit, he did require BiPAP, his work-up was significant for COPD starvation and volume overload, he has improved with steroids and diuresis.  As well he did develop A-fib for which she was started on amiodarone and anticoagulation.    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 -Line was placed 10/31 . -Wound VAC has been discontinued  before discharge 11/6, patient to continue regular dressing changes and to follow with Dr. Sharol Given in 1 week  Acute on chronic respiratory failure with hypoxia ([place]) COPD exacerbation -Patient with COPD, on 3L home O2, after surgery he was with significant oxygen requirement and dyspnea for which she is required BiPAP, high flow nasal cannula, he was treated with IV diuresis, and IV  steroids with significant improvement, he is back to baseline 3 to 4 L nasal cannula, to continue home dose torsemide, no need for further steroids at time of discharge.  COPD exacerbation -Continue with home meds, no further needs of steroids on discharge  Atrial fibrillation, new diagnosis  -Cardiology input greatly appreciated , . -started  amiodarone for heart rate control, he is with mild bradycardia for which his metoprolol has been decreased from 50 mg twice daily to 25 mg twice daily   Anemia of chronic kidney disease -1 unit PRBC 11/4. -Panel significant for normal ferritin, but low saturation and iron level, would like to avoid IV iron in the setting of infection, so we will start on p.o. supplements, he would likely benefit from Aranesp in the future once iron storage is repleted .   Hypertension -.continue  current regimenl - metoprolol  decreased 11/5 due to bradycardia    Acute on chronic diastolic CHF -Volume overload on imaging, and significant oxygen requirement. -Significantly improved with IV Lasix and as needed Zaroxolyn, he will be discharged on his home regimen.   Chronic pain (back, legs) -He has chronic pain in addition to acute pain -Cannot review PDMP since he is in a SNF -Continue Norco, Neurontin or as per orthopedics   Stage 4 chronic kidney disease ([place]) -Appears to be stable at this time -Attempt to avoid nephrotoxic medications -Recheck BMP periodically -Continue calcitriol -Continue colchicine for gout   Mood disorder ([place]) -Continue fluoxetine, alprazolam   Type 2 diabetes mellitus with stage 4 chronic kidney disease, with Sen-term current use of insulin ([name]) -A1c is 5.8, indicating good control -He will be discharged on glargine and insulin sliding scale         Discharge Instructions  Discharge Instructions     [facility] pharmacist to adjust dose for Vancomycin, Aminoglycosides and other anti-infective therapies as  requested by physician.   Complete by: As directed    [facility] to provide Cath Flo 31m   Complete by: As directed    Administer for PICC line occlusion and as ordered by physician for other access device issues.   Anaphylaxis Kit: Provided to treat any anaphylactic reaction to the medication being provided to the patient if First Dose or when requested by physician   Complete by: As directed    Epinephrine 162mml vial / amp: Administer 0.70m84m0.70ml97mubcutaneously once for moderate to severe anaphylaxis, nurse to call physician and pharmacy when reaction occurs and call 911 if needed for immediate care   Diphenhydramine 50mg4mIV vial: Administer 25-50mg 71mM PRN for first dose reaction, rash, itching, mild reaction, nurse to call physician and pharmacy when reaction occurs   Sodium Chloride 0.9% NS 500ml I87mdminister if needed for hypovolemic blood pressure drop or as ordered by physician after call to physician with anaphylactic reaction   Change dressing   Complete by: As directed    Change dry dressing to left leg as needed.  Wash leg with soap and water.   Change dressing on IV access line weekly and PRN   Complete by: As directed  Diet - low sodium heart healthy   Complete by: As directed    Discharge instructions   Complete by: As directed    Follow with Primary MD [name], MD SNF physician     Disposition SNF   Diet: Heart Healthy /carb modified 1500 cc fluid restriction per day  On your next visit with your primary care physician please Get Medicines reviewed and adjusted.   Please request your Prim.MD to go over all Hospital Tests and Procedure/Radiological results at the follow up, please get all Hospital records sent to your Prim MD by signing hospital release before you go home.   If you experience worsening of your admission symptoms, develop shortness of breath, life threatening emergency, suicidal or homicidal thoughts you must seek medical  attention immediately by calling 911 or calling your MD immediately  if symptoms less severe.  You Must read complete instructions/literature along with all the possible adverse reactions/side effects for all the Medicines you take and that have been prescribed to you. Take any new Medicines after you have completely understood and accpet all the possible adverse reactions/side effects.   Do not drive, operating heavy machinery, perform activities at heights, swimming or participation in water activities or provide baby sitting services if your were admitted for syncope or siezures until you have seen by Primary MD or a Neurologist and advised to do so again.  Do not drive when taking Pain medications.    Do not take more than prescribed Pain, Sleep and Anxiety Medications  Special Instructions: If you have smoked or chewed Tobacco  in the last 2 yrs please stop smoking, stop any regular Alcohol  and or any Recreational drug use.  Wear Seat belts while driving.   Please note  You were cared for by a hospitalist during your hospital stay. If you have any questions about your discharge medications or the care you received while you were in the hospital after you are discharged, you can call the unit and asked to speak with the hospitalist on call if the hospitalist that took care of you is not available. Once you are discharged, your primary care physician will handle any further medical issues. Please note that NO REFILLS for any discharge medications will be authorized once you are discharged, as it is imperative that you return to your primary care physician (or establish a relationship with a primary care physician if you do not have one) for your aftercare needs so that they can reassess your need for medications and monitor your lab values.   Discharge wound care:   Complete by: As directed    apply 4 x 4 gauze plus Ace wrap.  please change daily + as needed   Flush IV access with Sodium  Chloride 0.9% and Heparin 10 units/ml or 100 units/ml   Complete by: As directed    Home infusion instructions - [facility]   Complete by: As directed    Instructions: Flush IV access with Sodium Chloride 0.9% and Heparin 10units/ml or 100units/ml   Change dressing on IV access line: Weekly and PRN   Instructions Cath Flo 17m: Administer for PICC Line occlusion and as ordered by physician for other access device   [facility] pharmacist to adjust dose for: Vancomycin, Aminoglycosides and other anti-infective therapies as requested by physician   Increase activity slowly   Complete by: As directed    Method of administration may be changed at the discretion of home infusion pharmacist based  upon assessment of the patient and/or caregiver's ability to self-administer the medication ordered   Complete by: As directed       Allergies as of 04/11/2022       Reactions   Baclofen    unknown   Blueberry Flavor Other (See Comments)   Unknown   Cucumber Extract Other (See Comments)   "Fells like I'm having a heart attack"   Flexeril [cyclobenzaprine Hcl] Other (See Comments)   "whole body  Tremors" (05/27/2012)   Kiwi Extract Other (See Comments)   "feels like I'm having a heart attack"        Medication List     STOP taking these medications    aspirin EC 81 MG tablet   cefTRIAXone 1 g injection Commonly known as: ROCEPHIN   linezolid 600 MG tablet Commonly known as: ZYVOX       TAKE these medications    acetaminophen 325 MG tablet Commonly known as: TYLENOL Take 2 tablets (650 mg total) by mouth every 6 (six) hours as needed for mild pain, fever or headache (or Fever >/= 101). What changed:  when to take this reasons to take this   albuterol (2.5 MG/3ML) 0.083% nebulizer solution Commonly known as: PROVENTIL Take 3 mLs (2.5 mg total) by nebulization every 2 (two) hours as needed for wheezing or shortness of breath.   ALPRAZolam 0.5 MG  tablet Commonly known as: XANAX Take 1 tablet (0.5 mg total) by mouth at bedtime.   amiodarone 200 MG tablet Commonly known as: PACERONE Continue amiodarone 200 mg BID or 8 days (16 more doses), then transition to amiodarone 200 mg daily.   amLODipine 10 MG tablet Commonly known as: NORVASC Take 1 tablet (10 mg total) by mouth daily.   apixaban 5 MG Tabs tablet Commonly known as: ELIQUIS Take 1 tablet (5 mg total) by mouth 2 (two) times daily.   ascorbic acid 500 MG tablet Commonly known as: VITAMIN C Take 500 mg by mouth 2 (two) times daily.   Breo Ellipta 100-25 MCG/ACT Aepb Generic drug: fluticasone furoate-vilanterol Inhale 1 puff into the lungs daily.   calcitRIOL 0.25 MCG capsule Commonly known as: ROCALTROL Take 0.25 mcg by mouth daily.   calcium carbonate 500 MG chewable tablet Commonly known as: TUMS - dosed in mg elemental calcium Chew 2 tablets by mouth 2 (two) times daily.   cholecalciferol 1000 units tablet Commonly known as: VITAMIN D Take 1,000 Units by mouth daily.   coconut oil Oil Apply 1 Application topically every 6 (six) hours as needed (protection and prevention).   colchicine 0.6 MG tablet Take 0.5 tablets (0.3 mg total) by mouth daily. What changed: how much to take   cyanocobalamin 1000 MCG tablet Take 1,000 mcg by mouth daily.   DAPTOmycin 500 MG injection Commonly known as: CUBICIN Inject 15 mLs (750 mg total) into the vein every other day for 28 days. Indication:  Polymicrobial L-ankle osteo First Dose: Yes Last Day of Therapy:  05/09/22 Labs - Once weekly:  CBC/D, BMP, and CPK Labs - Every other week:  ESR and CRP Rate: 130 ml/hr  Add 750 mg of Daptomycin to Normal Saline based on available bag volume.   ferrous sulfate 325 (65 FE) MG tablet Take 325 mg by mouth 2 (two) times daily.   FLUoxetine 40 MG capsule Commonly known as: PROZAC Take 40 mg by mouth daily.   gabapentin 300 MG capsule Commonly known as: NEURONTIN Take 1  capsule (300 mg total) by mouth  at bedtime.   guaiFENesin 200 MG tablet Take 200 mg by mouth 2 (two) times daily.   HYDROcodone-acetaminophen 7.5-325 MG tablet Commonly known as: NORCO Take 1 tablet by mouth 4 (four) times daily.   insulin aspart 100 UNIT/ML injection Commonly known as: novoLOG Inject 2-10 Units into the skin See admin instructions. Sliding scale if 200-250 = 2 units, 251-300 = 4 units, 301-350 = 6 units, 351-400 = 8 units, 401-500 units = 10 units before meals and at betime   ipratropium-albuterol 0.5-2.5 (3) MG/3ML Soln Commonly known as: DUONEB Take 3 mLs by nebulization every 4 (four) hours as needed (shortness of breath / wheezing).   Lantus 100 UNIT/ML injection Generic drug: insulin glargine Inject 0.22 mLs (22 Units total) into the skin at bedtime. What changed: how much to take   magnesium oxide 400 MG tablet Commonly known as: MAG-OX Take 400 mg by mouth daily.   meropenem 1 g injection Commonly known as: MERREM Inject 1 g into the vein every 12 (twelve) hours for 28 days. Indication:  Polymicrobial L-ankle osteo First Dose: Yes Last Day of Therapy:  05/09/22 Labs - Once weekly:  CBC/D and BMP, Labs - Every other week:  ESR and CRP Rate: 200 mL/hour   metolazone 2.5 MG tablet Commonly known as: ZAROXOLYN Take 1 tablet (2.5 mg total) by mouth every Monday, Wednesday, and Friday.   metoprolol tartrate 25 MG tablet Commonly known as: LOPRESSOR Take 1 tablet (25 mg total) by mouth 2 (two) times daily. What changed:  medication strength how much to take   Nutritional Supplement Liqd Take 120 mLs by mouth daily. "House supplement"   pantoprazole 40 MG tablet Commonly known as: PROTONIX Take 1 tablet (40 mg total) by mouth daily before breakfast.   polyethylene glycol 17 g packet Commonly known as: MIRALAX / GLYCOLAX Take 17 g by mouth daily. For constipation   potassium bicarbonate 25 MEQ disintegrating tablet Commonly known as:  K-LYTE Take 25 mEq by mouth daily.   Pro-Stat AWC Liqd Take 30 mLs by mouth daily.   senna 8.6 MG tablet Commonly known as: SENOKOT Take 2 tablets by mouth 2 (two) times daily.   tamsulosin 0.4 MG Caps capsule Commonly known as: FLOMAX Take 1 capsule (0.4 mg total) by mouth daily after supper. What changed: when to take this   torsemide 20 MG tablet Commonly known as: Demadex Take 4 tablets (80 mg total) by mouth 2 (two) times daily.               Discharge Care Instructions  (From admission, onward)           Start     Ordered   04/10/22 0000  Change dressing on IV access line weekly and PRN  (Home infusion instructions - [facility] )        04/10/22 1136   04/10/22 0000  Discharge wound care:       Comments: apply 4 x 4 gauze plus Ace wrap.  please change daily + as needed   04/10/22 1147   04/09/22 0000  Change dressing        04/09/22 1613            Follow-up Information     [name], MD Follow up in 1 week(s).   Specialty: Orthopedic Surgery Contact information: [place] [address] [phone]         Mignon Pine, DO Follow up on 04/24/2022.   Specialties:  Infectious Diseases, Internal Medicine Why: Hospital Discharge Follow Up 10:45 am appointment Contact information: [address] [name] [address] [phone]                Allergies  Allergen Reactions   Baclofen     unknown   Blueberry Flavor Other (See Comments)    Unknown   Cucumber Extract Other (See Comments)    "Fells like I'm having a heart attack"   Flexeril [Cyclobenzaprine Hcl] Other (See Comments)    "whole body  Tremors" (05/27/2012)   Kiwi Extract Other (See Comments)    "feels like I'm having a heart attack"    Consultations: ID Cardiology Ortho dr duda    Procedures/Studies: ECHOCARDIOGRAM COMPLETE  Result Date: 04/05/2022    ECHOCARDIOGRAM REPORT   Patient Name:   Eric Scott  Date of Exam: 04/05/2022 Medical Rec #:  [id number]      Height:       72.0 in Accession #:    [id number]     Weight:       255.0 lb Date of Birth:  September 29, 1950      BSA:          2.362 m Patient Age:    71 years       BP:           147/66 mmHg Patient Gender: M              HR:           65 bpm. Exam Location:  Inpatient Procedure: 2D Echo, Color Doppler, Cardiac Doppler and Intracardiac            Opacification Agent Indications:    Atrial Fibrillation I48.91  History:        Patient has prior history of Echocardiogram examinations, most                 recent 12/19/2019. Stroke and COPD; Risk Factors:Hypertension,                 Diabetes, Dyslipidemia and Former Smoker. Chronic kidney                 disease.  Sonographer:    Darlina Sicilian RDCS Referring Phys: Gravity  1. Left ventricular ejection fraction, by estimation, is 60 to 65%. The left ventricle has normal function. The left ventricle has no regional wall motion abnormalities. The left ventricular internal cavity size was mildly dilated. There is moderate concentric left ventricular hypertrophy. Left ventricular diastolic parameters are indeterminate. Elevated left ventricular end-diastolic pressure.  2. Right ventricular systolic function is normal. The right ventricular size is moderately enlarged. There is moderately elevated pulmonary artery systolic pressure. The estimated right ventricular systolic pressure is 33.0 mmHg.  3. The mitral valve is normal in structure. Trivial mitral valve regurgitation. No evidence of mitral stenosis.  4. The aortic valve is tricuspid. Aortic valve regurgitation is trivial. Aortic valve sclerosis is present, with no evidence of aortic valve stenosis.  5. The inferior vena cava is dilated in size with >50% respiratory variability, suggesting right atrial pressure of 8 mmHg. FINDINGS  Left Ventricle: Left ventricular ejection fraction, by estimation, is 60 to 65%. The left ventricle has normal  function. The left ventricle has no regional wall motion abnormalities. Definity contrast agent was given IV to delineate the left ventricular  endocardial borders. The left ventricular internal cavity size was mildly dilated. There is moderate concentric left  ventricular hypertrophy. Left ventricular diastolic parameters are indeterminate. Elevated left ventricular end-diastolic pressure. Right Ventricle: The right ventricular size is moderately enlarged. No increase in right ventricular wall thickness. Right ventricular systolic function is normal. There is moderately elevated pulmonary artery systolic pressure. The tricuspid regurgitant  velocity is 3.09 m/s, and with an assumed right atrial pressure of 8 mmHg, the estimated right ventricular systolic pressure is 49.4 mmHg. Left Atrium: Left atrial size was normal in size. Right Atrium: Right atrial size was normal in size. Pericardium: There is no evidence of pericardial effusion. Mitral Valve: The mitral valve is normal in structure. Trivial mitral valve regurgitation. No evidence of mitral valve stenosis. MV peak gradient, 6.2 mmHg. Tricuspid Valve: The tricuspid valve is normal in structure. Tricuspid valve regurgitation is mild . No evidence of tricuspid stenosis. Aortic Valve: The aortic valve is tricuspid. Aortic valve regurgitation is trivial. Aortic valve sclerosis is present, with no evidence of aortic valve stenosis. Pulmonic Valve: The pulmonic valve was normal in structure. Pulmonic valve regurgitation is trivial. No evidence of pulmonic stenosis. Aorta: The aortic root is normal in size and structure. Venous: The inferior vena cava is dilated in size with greater than 50% respiratory variability, suggesting right atrial pressure of 8 mmHg. IAS/Shunts: No atrial level shunt detected by color flow Doppler.  LEFT VENTRICLE PLAX 2D LVIDd:         5.15 cm      Diastology LVIDs:         3.55 cm      LV e' medial:    3.99 cm/s LV PW:         1.35 cm       LV E/e' medial:  30.3 LV IVS:        1.50 cm      LV e' lateral:   10.40 cm/s LVOT diam:     2.20 cm      LV E/e' lateral: 11.6 LV SV:         96 LV SV Index:   41 LVOT Area:     3.80 cm  LV Volumes (MOD) LV vol d, MOD A2C: 233.0 ml LV vol d, MOD A4C: 192.0 ml LV vol s, MOD A2C: 70.6 ml LV vol s, MOD A4C: 72.1 ml LV SV MOD A2C:     162.4 ml LV SV MOD A4C:     192.0 ml LV SV MOD BP:      140.6 ml RIGHT VENTRICLE RV Basal diam:  5.20 cm RV Mid diam:    4.10 cm RV S prime:     8.55 cm/s TAPSE (M-mode): 1.9 cm LEFT ATRIUM             Index        RIGHT ATRIUM           Index LA diam:        4.20 cm 1.78 cm/m   RA Area:     15.60 cm LA Vol (A2C):   79.0 ml 33.44 ml/m  RA Volume:   31.20 ml  13.21 ml/m LA Vol (A4C):   51.8 ml 21.93 ml/m LA Biplane Vol: 67.4 ml 28.53 ml/m  AORTIC VALVE LVOT Vmax:   117.00 cm/s LVOT Vmean:  75.400 cm/s LVOT VTI:    0.252 m  AORTA Ao Root diam: 3.30 cm Ao Asc diam:  3.40 cm MITRAL VALVE                TRICUSPID VALVE MV Area (PHT):  4.86 cm     TR Peak grad:   38.2 mmHg MV Peak grad:  6.2 mmHg     TR Vmax:        309.00 cm/s MV Vmax:       1.24 m/s MV Decel Time: 156 msec     SHUNTS MV E velocity: 121.00 cm/s  Systemic VTI:  0.25 m MV A velocity: 71.80 cm/s   Systemic Diam: 2.20 cm MV E/A ratio:  1.69 Fransico Him MD Electronically signed by Fransico Him MD Signature Date/Time: 04/05/2022/2:11:52 PM    Final    DG Chest Port 1 View  Result Date: 04/05/2022 CLINICAL DATA:  Hypoxia EXAM: PORTABLE CHEST 1 VIEW COMPARISON:  Chest x-ray dated April 01, 2022 FINDINGS: Interval placement of right central venous line with tip positioned over the expected area of the upper right atrium. Cardiac and mediastinal contours are unchanged. Similar lower lung predominant heterogeneous opacities. No large pleural effusion or evidence of pneumothorax. IMPRESSION: 1. Interval placement of right central venous line with tip positioned over the expected area of the upper right atrium. 2. Similar  lower lung predominant heterogeneous opacities which are likely due to pulmonary edema. Electronically Signed   By: Yetta Glassman M.D.   On: 04/05/2022 08:27   IR Fluoro Guide CV Line Right  Result Date: 04/03/2022 INDICATION: Poor venous access. Chronic renal insufficiency. In need of durable intravenous access for antibiotic administration. EXAM: ULTRASOUND AND FLUOROSCOPIC GUIDED PLACEMENT OF TUNNELED CENTRAL VENOUS CATHETER MEDICATIONS: None ANESTHESIA/SEDATION: None FLUOROSCOPY TIME:  18 seconds (8 mGy) COMPLICATIONS: None immediate. PROCEDURE: Informed written consent was obtained from the patient after a discussion of the risks, benefits, and alternatives to treatment. Questions regarding the procedure were encouraged and answered. The right neck and chest were prepped with chlorhexidine in a sterile fashion, and a sterile drape was applied covering the operative field. Maximum barrier sterile technique with sterile gowns and gloves were used for the procedure. A timeout was performed prior to the initiation of the procedure. After the overlying soft tissues were anesthetized, a small venotomy incision was created and a micropuncture kit was utilized to access the internal jugular vein. Real-time ultrasound guidance was utilized for vascular access including the acquisition of a permanent ultrasound image documenting patency of the accessed vessel. The microwire was utilized to measure appropriate catheter length. The micropuncture sheath was exchanged for a peel-away sheath over a guidewire. A 5 [place] dual lumen tunneled central venous catheter measuring 23 cm was tunneled in a retrograde fashion from the anterior chest wall to the venotomy incision. The catheter was then placed through the peel-away sheath with tip ultimately positioned at the superior caval-atrial junction. Final catheter positioning was confirmed and documented with a spot radiographic image. The catheter aspirates and flushes  normally. The catheter was flushed with appropriate volume heparin dwells. The catheter exit site was secured with a 0-Prolene retention suture. The venotomy incision was closed with Dermabond and Steri-strips. Dressings were applied. The patient tolerated the procedure well without immediate post procedural complication. FINDINGS: After catheter placement, the tip lies within the superior cavoatrial junction. The catheter aspirates and flushes normally and is ready for immediate use. IMPRESSION: Successful placement of 23 cm dual lumen tunneled central venous catheter via the right internal jugular vein with tip terminating at the superior caval atrial junction. The catheter is ready for immediate use. Electronically Signed   By: Sandi Mariscal M.D.   On: 04/03/2022 10:05   IR US Guide Vasc Access  Right  Result Date: 04/03/2022 INDICATION: Poor venous access. Chronic renal insufficiency. In need of durable intravenous access for antibiotic administration. EXAM: ULTRASOUND AND FLUOROSCOPIC GUIDED PLACEMENT OF TUNNELED CENTRAL VENOUS CATHETER MEDICATIONS: None ANESTHESIA/SEDATION: None FLUOROSCOPY TIME:  18 seconds (8 mGy) COMPLICATIONS: None immediate. PROCEDURE: Informed written consent was obtained from the patient after a discussion of the risks, benefits, and alternatives to treatment. Questions regarding the procedure were encouraged and answered. The right neck and chest were prepped with chlorhexidine in a sterile fashion, and a sterile drape was applied covering the operative field. Maximum barrier sterile technique with sterile gowns and gloves were used for the procedure. A timeout was performed prior to the initiation of the procedure. After the overlying soft tissues were anesthetized, a small venotomy incision was created and a micropuncture kit was utilized to access the internal jugular vein. Real-time ultrasound guidance was utilized for vascular access including the acquisition of a permanent  ultrasound image documenting patency of the accessed vessel. The microwire was utilized to measure appropriate catheter length. The micropuncture sheath was exchanged for a peel-away sheath over a guidewire. A 5 [place] dual lumen tunneled central venous catheter measuring 23 cm was tunneled in a retrograde fashion from the anterior chest wall to the venotomy incision. The catheter was then placed through the peel-away sheath with tip ultimately positioned at the superior caval-atrial junction. Final catheter positioning was confirmed and documented with a spot radiographic image. The catheter aspirates and flushes normally. The catheter was flushed with appropriate volume heparin dwells. The catheter exit site was secured with a 0-Prolene retention suture. The venotomy incision was closed with Dermabond and Steri-strips. Dressings were applied. The patient tolerated the procedure well without immediate post procedural complication. FINDINGS: After catheter placement, the tip lies within the superior cavoatrial junction. The catheter aspirates and flushes normally and is ready for immediate use. IMPRESSION: Successful placement of 23 cm dual lumen tunneled central venous catheter via the right internal jugular vein with tip terminating at the superior caval atrial junction. The catheter is ready for immediate use. Electronically Signed   By: Sandi Mariscal M.D.   On: 04/03/2022 10:05   DG CHEST PORT 1 VIEW  Result Date: 04/01/2022 CLINICAL DATA:  Shortness of breath EXAM: PORTABLE CHEST 1 VIEW COMPARISON:  Chest radiograph dated 05/11/2020 FINDINGS: The heart is enlarged. Vascular calcifications are seen in the aortic arch. Moderate bilateral lower lung predominant interstitial opacities are noted. There is no pleural effusion or pneumothorax. Degenerative changes are seen in the spine. IMPRESSION: Moderate bilateral lower lung predominant interstitial opacities, which may represent pulmonary edema or atypical  infection. Electronically Signed   By: Zerita Boers M.D.   On: 04/01/2022 19:15   DG MINI C-ARM IMAGE ONLY  Result Date: 03/28/2022 There is no interpretation for this exam.  This order is for images obtained during a surgical procedure.  Please See "Surgeries" Tab for more information regarding the procedure.   XR Ankle Complete Left  Result Date: 03/23/2022 Three-view radiographs of the left ankle shows a screw and K wire in the medial malleolus.  There is a fibular plate laterally with collapse of the tibial talar joint.  There is no definite lytic changes of the medial malleolus.  No lucency around the hardware.     Subjective:  He denies any complaints today, no use of BiPAP over the last 24 hours Discharge Exam: Vitals:   04/11/22 0341 04/11/22 0802  BP: (!) 157/84 (!) 151/97  Pulse: 82 69  Resp: 14 16  Temp: 98 F (36.7 C) 97.8 F (36.6 C)  SpO2: 93% 93%   Vitals:   04/10/22 2023 04/10/22 2313 04/11/22 0341 04/11/22 0802  BP:  (!) 153/72 (!) 157/84 (!) 151/97  Pulse:  93 82 69  Resp:   14 16  Temp:  (!) 97.5 F (36.4 C) 98 F (36.7 C) 97.8 F (36.6 C)  TempSrc:  Oral    SpO2: 94% 93% 93% 93%  Weight:   129.5 kg   Height:        General: Pt is alert, awake, not in acute distress, frail Cardiovascular: irregular, S1/S2 +, no rubs, no gallops Respiratory: No wheezing today Abdominal: Soft, NT, ND, bowel sounds + Extremities: Left foot bandaged    The results of significant diagnostics from this hospitalization (including imaging, microbiology, ancillary and laboratory) are listed below for reference.     Microbiology: No results found for this or any previous visit (from the past 240 hour(s)).   Labs: BNP (last 3 results) Recent Labs    04/04/22 0435  BNP 203.5*   Basic Metabolic Panel: Recent Labs  Lab 04/07/22 0547 04/08/22 0418 04/09/22 0155 04/10/22 0445 04/11/22 0545  NA 144 137 138 140 137  K 4.6 4.9 5.0 4.3 5.6*  CL 110 102 102 106  98  CO2 _0 GLUCOSE 189* 286* 293* 225* 333*  BUN 66* 74* 81* 77* 90*  CREATININE 3.01* 2.99* 3.03* 2.88* 3.12*  CALCIUM 8.9 8.4* 8.6* 7.8* 8.6*  MG  --   --  1.9  --   --    Liver Function Tests: No results for input(s): "AST", "ALT", "ALKPHOS", "BILITOT", "PROT", "ALBUMIN" in the last 168 hours. No results for input(s): "LIPASE", "AMYLASE" in the last 168 hours. No results for input(s): "AMMONIA" in the last 168 hours. CBC: Recent Labs  Lab 04/07/22 0547 04/07/22 1600 04/08/22 0418 04/09/22 0155 04/10/22 0445 04/11/22 0545  WBC 6.2  --  7.2 9.6 12.1* 13.1*  HGB 7.4* 8.7* 8.1* 8.4* 9.0* 9.5*  HCT 22.8* 27.7* 24.9* 25.2* 27.3* 29.5*  MCV 94.2  --  94.0 93.3 92.9 93.1  PLT 236  --  249 244 262 264   Cardiac Enzymes: Recent Labs  Lab 04/06/22 0636  CKTOTAL 26*   BNP: Invalid input(s): "POCBNP" CBG: Recent Labs  Lab 04/10/22 0627 04/10/22 1126 04/10/22 1629 04/10/22 2058 04/11/22 0601  GLUCAP 183* 311* 335* 304* 315*   D-Dimer No results for input(s): "DDIMER" in the last 72 hours. Hgb A1c No results for input(s): "HGBA1C" in the last 72 hours. Lipid Profile No results for input(s): "CHOL", "HDL", "LDLCALC", "TRIG", "CHOLHDL", "LDLDIRECT" in the last 72 hours. Thyroid function studies No results for input(s): "TSH", "T4TOTAL", "T3FREE", "THYROIDAB" in the last 72 hours.  Invalid input(s): "FREET3" Anemia work up No results for input(s): "VITAMINB12", "FOLATE", "FERRITIN", "TIBC", "IRON", "RETICCTPCT" in the last 72 hours. Urinalysis    Component Value Date/Time   COLORURINE YELLOW 05/11/2020 2100   APPEARANCEUR HAZY (A) 05/11/2020 2100   LABSPEC 1.012 05/11/2020 2100   PHURINE 5.0 05/11/2020 2100   GLUCOSEU NEGATIVE 05/11/2020 2100   HGBUR NEGATIVE 05/11/2020 2100   [place] NEGATIVE 05/11/2020 2100   [place] NEGATIVE 05/11/2020 2100   PROTEINUR 30 (A) 05/11/2020 2100   UROBILINOGEN 0.2 03/04/2015 1835   NITRITE NEGATIVE 05/11/2020  2100   LEUKOCYTESUR MODERATE (A) 05/11/2020 2100   Sepsis Labs Recent Labs  Lab 04/08/22 0418 04/09/22 0155 04/10/22 0445 04/11/22  0545  WBC 7.2 9.6 12.1* 13.1*   Microbiology No results found for this or any previous visit (from the past 240 hour(s)).   Time coordinating discharge: Over 30 minutes  SIGNED:   Oren Binet, MD  [facility] 04/11/2022, 10:55 AM Pager   If 7PM-7AM, please contact night-coverage [email or web address]

## 2022-04-10 NOTE — Progress Notes (Signed)
Discharge will be postponed till tomorrow, as when Eric Scott is here, patient appears tachypneic, with some wheezing, but he remains on 4 L nasal cannula, will keep overnight, will give 1 dose of steroids and nebulizer treatment. -As well social worker was notified by nursing at SNF that nursing staff at the facility will not be able to administer the antibiotic in the current format despite discussing with the pharmacy at the facility which reports that should not be an issue. Eric Climes MD

## 2022-04-10 NOTE — Progress Notes (Signed)
Rounding Note    Patient Name: Eric Scott Date of Encounter: 04/10/2022  [place] Cardiologist: Jenkins Rouge, MD   Subjective   In atrial flutter again today rate controlled, but after diuresis feels better and renal fxn have improved.   Inpatient Medications    Scheduled Meds:  ALPRAZolam  0.5 mg Oral QHS   amiodarone  200 mg Oral BID   amLODipine  10 mg Oral Daily   aspirin EC  81 mg Oral Q breakfast   calcitRIOL  0.25 mcg Oral Daily   Chlorhexidine Gluconate Cloth  6 each Topical Daily   colchicine  0.6 mg Oral Daily   cyanocobalamin  1,000 mcg Oral Daily   docusate sodium  100 mg Oral BID   feeding supplement  237 mL Oral TID BM   ferrous sulfate  325 mg Oral TID WC   FLUoxetine  40 mg Oral Daily   fluticasone furoate-vilanterol  1 puff Inhalation Daily   furosemide  80 mg Intravenous BID   gabapentin  300 mg Oral QHS   heparin injection (subcutaneous)  5,000 Units Subcutaneous Q8H   hydrALAZINE  25 mg Oral Q6H   insulin aspart  0-15 Units Subcutaneous TID WC   insulin aspart  6 Units Subcutaneous TID WC   insulin glargine-yfgn  18 Units Subcutaneous QHS   metoprolol tartrate  25 mg Oral BID   pantoprazole  40 mg Oral Daily   predniSONE  20 mg Oral Q breakfast   senna-docusate  2 tablet Oral BID   tamsulosin  0.4 mg Oral QPC supper   Continuous Infusions:  sodium chloride Stopped (04/01/22 0827)   DAPTOmycin (CUBICIN) 750 mg in sodium chloride 0.9 % IVPB 750 mg (04/09/22 1843)   meropenem (MERREM) IV 1 g (04/10/22 0426)   methocarbamol (ROBAXIN) IV     PRN Meds: acetaminophen, albuterol, bisacodyl, HYDROcodone-acetaminophen, HYDROcodone-acetaminophen, loperamide, magnesium citrate, methocarbamol **OR** methocarbamol (ROBAXIN) IV, metoCLOPramide **OR** metoCLOPramide (REGLAN) injection, nitroGLYCERIN, ondansetron **OR** ondansetron (ZOFRAN) IV, mouth rinse, polyethylene glycol   Vital Signs    Vitals:   04/10/22 0000 04/10/22 0345 04/10/22  0416 04/10/22 0736  BP: 137/68 (!) 153/79    Pulse: 72 70  72  Resp: '15 17  18  '$ Temp:  97.6 F (36.4 C)    TempSrc:  Oral    SpO2: 98% 99%  92%  Weight:   129 kg   Height:        Intake/Output Summary (Last 24 hours) at 04/10/2022 0904 Last data filed at 04/10/2022 0417 Gross per 24 hour  Intake 180 ml  Output 1600 ml  Net -1420 ml      04/10/2022    4:16 AM 04/09/2022    6:10 AM 04/07/2022    6:18 AM  Last 3 Weights  Weight (lbs) 284 lb 6.3 oz 285 lb 4.4 oz 282 lb 10.1 oz  Weight (kg) 129 kg 129.4 kg 128.2 kg      Telemetry    Atrial flutter CVR- Personally Reviewed  ECG    No new tracings- Personally Reviewed  Physical Exam   GEN: No acute distress. Sitting in the bed  Neck: No JVD Cardiac: iRRR, faint SEM  Respiratory: diminished in bases. Wearing [place] with supplemental oxygen  GI: Soft, nontender, non-distended  MS: no RLE edema. Left lower extremity covered in bandages with wound vac in place Neuro:  Nonfocal  Psych: Normal affect   Labs    High Sensitivity Troponin:   Recent Labs  Lab 04/06/22 1438 04/06/22 1629  TROPONINIHS 13 12     Chemistry Recent Labs  Lab 04/08/22 0418 04/09/22 0155 04/10/22 0445  NA 137 138 140  K 4.9 5.0 4.3  CL 102 102 106  CO2 '22 24 24  '$ GLUCOSE 286* 293* 225*  BUN 74* 81* 77*  CREATININE 2.99* 3.03* 2.88*  CALCIUM 8.4* 8.6* 7.8*  MG  --  1.9  --   GFRNONAA 22* 21* 23*  ANIONGAP '13 12 10    '$ Lipids No results for input(s): "CHOL", "TRIG", "HDL", "LABVLDL", "LDLCALC", "CHOLHDL" in the last 168 hours.  Hematology Recent Labs  Lab 04/08/22 0418 04/09/22 0155 04/10/22 0445  WBC 7.2 9.6 12.1*  RBC 2.65* 2.70* 2.94*  HGB 8.1* 8.4* 9.0*  HCT 24.9* 25.2* 27.3*  MCV 94.0 93.3 92.9  MCH 30.6 31.1 30.6  MCHC 32.5 33.3 33.0  RDW 14.4 14.1 14.2  PLT 249 244 262   Thyroid No results for input(s): "TSH", "FREET4" in the last 168 hours.  BNP Recent Labs  Lab 04/04/22 0435  BNP 985.2*    DDimer No results for  input(s): "DDIMER" in the last 168 hours.   Radiology    No results found.  Cardiac Studies   Echocardiogram 04/05/22 1. Left ventricular ejection fraction, by estimation, is 60 to 65%. The  left ventricle has normal function. The left ventricle has no regional  wall motion abnormalities. The left ventricular internal cavity size was  mildly dilated. There is moderate  concentric left ventricular hypertrophy. Left ventricular diastolic  parameters are indeterminate. Elevated left ventricular end-diastolic  pressure.   2. Right ventricular systolic function is normal. The right ventricular  size is moderately enlarged. There is moderately elevated pulmonary artery  systolic pressure. The estimated right ventricular systolic pressure is  56.3 mmHg.   3. The mitral valve is normal in structure. Trivial mitral valve  regurgitation. No evidence of mitral stenosis.   4. The aortic valve is tricuspid. Aortic valve regurgitation is trivial.  Aortic valve sclerosis is present, with no evidence of aortic valve  stenosis.   5. The inferior vena cava is dilated in size with >50% respiratory  variability, suggesting right atrial pressure of 8 mmHg.   Patient Profile     71 y.o. male with a hx of HTN, HFpEF, COPD onn 3L chronically,  CKD IV, DM, and anemia seen for afib RVR, now in SR, in setting of osteomyelitis and acute decompensated HFpEF.  Assessment & Plan    New onset atrial fibrillation with RVR/atrial flutter - Developed in the setting of osteomyelitis, CHF.  Episode 11/2 lasting from 3:47 AM until 10:20 AM with conversion back to sinus rhythm. Per telemetry, patient now has atrial flutter as of 04/08/22. - PO amiodarone, continue as outpatient. Continue amiodarone 200 mg for 8 days (16 more doses), then transition to amiodarone 200 mg daily. - metoprolol 25 mg BID.  - CHADS-VASc 5, hemoglobin low, but now stable. Recommend starting DOAC if Hgb stable and no concern for bleeding. Per  pharmacy. - no longer requires outpatient monitor.   Osteomyelitis S/p hardware removal - Infectious disease following - Plan for 6 weeks of IV daptomycin and meropenem   Acute on chronic hypoxic respiratory failure HFpEF COPD HCAP? - Echo this admission with EF 60-65%, moderate LVH, elevated LVEDP, normal RV systolic function  - CXR 87/56 with moderate bilateral lower lung predominant interstitial opacities with may represent pulmonary edema vs atypical infection. CXR 11/2 with similar lower lung opacities  that are likely due to pulmonary edema  - Patient was started on IV lasix 80 mg BID, appropriate output. Weights inaccurate. Cr stable. Transition to torsemide today, home dose.  - baseline O2 requirements of 3L [place]. Breathing much better. Lung fields are clearing with diuresis and BD. Improved in last 24 hr. - Patient has duo-neb treatments ordered and PRN albuterol. Also has incentive spirometer.   CKD IV - follows with nephrology as an outpatient  - baseline Cr around 3-3.5 - follow BMET daily as patient is on diuretics    Anemia - on chronic disease with baseline Hgb of 10-11, Hgb 7.4 > received blood. HB has been relatively stable around 8.5. May need to consider anticoagulation with elevated CHADS2vasc score, no source of bleeding identified. - no reports of bleeding - management per primary   For questions or updates, please contact [place] Please consult [email or web address] for contact info under        Signed, Elouise Munroe, MD  04/10/2022, 9:04 AM

## 2022-04-10 NOTE — Discharge Instructions (Signed)

## 2022-04-10 NOTE — TOC Benefit Eligibility Note (Signed)
Patient Teacher, English as a foreign language completed.    The patient is currently admitted and upon discharge could be taking Eliquis 5 mg.  The current 30 day co-pay is $0.00.   The patient is insured through [place], [place] Patient Advocate Specialist [place] Patient Advocate Team Direct Number: [phone]  Fax: [phone]

## 2022-04-10 NOTE — Progress Notes (Signed)
Mobility Specialist Progress Note    04/10/22 1221  Mobility  Activity Transferred from chair to bed  Level of Assistance +2 (takes two people)  Assistive Device MaxiMove  Activity Response Tolerated well  Mobility Referral Yes  $Mobility charge 1 Mobility   Post-Mobility: 68 HR  Pt requesting to get back. Left with call bell in reach.   Hildred Alamin Mobility Specialist  Secure Chat Only

## 2022-04-10 NOTE — TOC Transition Note (Signed)
Transition of Care [facility]) - CM/SW Discharge Note   Patient Details  Name: Eric Scott MRN: [id number] Date of Birth: 02-06-1951  [facility] CM/SW Contact:  Bethann Berkshire, [place] Phone Number: 04/10/2022, 12:54 PM   Clinical Narrative:     Patient will DC to: [facility] SNF Anticipated DC date: 04/10/22 Family notified: pt to notify family Transport by: Corey Harold   Per MD patient ready for DC to [facility]. RN, patient, and facility notified of DC. Discharge Summary and FL2 sent to facility. RN to call report prior to discharge [phone] ). DC packet on chart. Ambulance transport requested for patient.   CSW will sign off for now as social work intervention is no longer needed. Please consult [place] again if new needs arise.   Final next level of care: Skilled Nursing Facility Barriers to Discharge: No Barriers Identified   Patient Goals and CMS Choice Patient states their goals for this hospitalization and ongoing recovery are:: walk one of these days   Choice offered to / list presented to : Patient (Pt ha been at [facility] x9 years and wants to return)  Discharge Placement              Patient chooses bed at:  [facility]) Patient to be transferred to facility by: [place] Name of family member notified: pt will notify family Patient and family notified of of transfer: 04/10/22  Discharge Plan and Services In-house Referral: Clinical Social Work   Post Acute Care Choice: Resumption of Svcs/PTA Provider                               Social Determinants of Health (SDOH) Interventions     Readmission Risk Interventions     No data to display

## 2022-04-10 NOTE — Progress Notes (Signed)
ANTICOAGULATION CONSULT NOTE - Initial Consult  Pharmacy Consult for Apixaban Indication: atrial fibrillation  Allergies  Allergen Reactions   Baclofen     unknown   Blueberry Flavor Other (See Comments)    Unknown   Cucumber Extract Other (See Comments)    "Fells like I'm having a heart attack"   Flexeril [Cyclobenzaprine Hcl] Other (See Comments)    "whole body  Tremors" (05/27/2012)   Kiwi Extract Other (See Comments)    "feels like I'm having a heart attack"    Patient Measurements: Height: 6' (182.9 cm) Weight: 129 kg (284 lb 6.3 oz) IBW/kg (Calculated) : 77.6  Vital Signs: Temp: 97.6 F (36.4 C) (11/07 0345) Temp Source: Oral (11/07 0345) BP: 153/79 (11/07 0345) Pulse Rate: 72 (11/07 0736)  Labs: Recent Labs    04/08/22 0418 04/09/22 0155 04/10/22 0445  HGB 8.1* 8.4* 9.0*  HCT 24.9* 25.2* 27.3*  PLT 249 244 262  CREATININE 2.99* 3.03* 2.88*    Estimated Creatinine Clearance: 32.7 mL/min (A) (by C-G formula based on SCr of 2.88 mg/dL (H)).   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   COPD (chronic obstructive pulmonary disease) ([place])    COVID-19    Depression    Diabetic peripheral neuropathy ([name])    "chronic" (62/94/7654)   Diastolic CHF ([name])    Duodenal ulcer hemorrhage    DVT (deep venous thrombosis) ([name])    right upper arm   GERD (gastroesophageal reflux disease)    Hypercholesteremia    Hypertension    Iron deficiency anemia    Osteomyelitis of ankle ([name])    Peripheral edema    PVD (peripheral vascular disease) ([place])    Spinal stenosis    mild lumbar (MRI 05/2012)-L2-L3 to L4-L5 , mild lumbar foraminal stenosis    Stroke ([place]) 05/2012   Subacute, lacunar infarcts within the left basal ganglia and posterior limp of the left internal  capsule/thalamus; "RUE; both feet weak" (05/27/2012)   Tremor    Type II diabetes mellitus ([name])     Medications:  Medications Prior to Admission  Medication Sig Dispense Refill Last Dose   acetaminophen (TYLENOL) 325 MG tablet Take 2 tablets (650 mg total) by mouth every 6 (six) hours as needed for mild pain, fever or headache (or Fever >/= 101). (Patient taking differently: Take 650 mg by mouth every 12 (twelve) hours as needed for mild pain.) 12 tablet 0 03/27/2022   ALPRAZolam (XANAX) 0.5 MG tablet Take 0.5 mg by mouth at bedtime.   03/27/2022   Amino Acids-Protein Hydrolys (PRO-STAT AWC) LIQD Take 30 mLs by mouth daily.   03/27/2022   amLODipine (NORVASC) 10 MG tablet Take 1 tablet (10 mg total) by mouth daily. 30 tablet 2 03/28/2022   ascorbic acid (VITAMIN C) 500 MG tablet Take 500 mg by mouth 2 (two) times daily.   03/27/2022   aspirin EC 81 MG tablet Take 1 tablet (81 mg total) by mouth daily with breakfast. 30 tablet 11 03/28/2022   BREO ELLIPTA 100-25 MCG/INH AEPB Inhale 1 puff into the lungs daily.    03/27/2022   calcitRIOL (ROCALTROL) 0.25 MCG capsule Take 0.25 mcg by mouth daily.   03/28/2022   calcium carbonate (TUMS - DOSED IN MG ELEMENTAL CALCIUM) 500 MG chewable  tablet Chew 2 tablets by mouth 2 (two) times daily.   03/27/2022   cefTRIAXone (ROCEPHIN) 1 g injection Inject 1 g into the muscle daily.   03/28/2022   cholecalciferol (VITAMIN D) 1000 units tablet Take 1,000 Units by mouth daily.   03/28/2022   coconut oil OIL Apply 1 Application topically every 6 (six) hours as needed (protection and prevention).   03/27/2022   colchicine 0.6 MG tablet Take 0.6 mg by mouth daily.   03/28/2022   cyanocobalamin 1000 MCG tablet Take 1,000 mcg by mouth daily.   03/27/2022   ferrous sulfate 325 (65 FE) MG tablet Take 325 mg by mouth 2 (two) times daily.   03/28/2022   FLUoxetine (PROZAC) 40 MG capsule Take 40 mg by mouth daily.   03/28/2022   gabapentin (NEURONTIN) 300 MG capsule Take  1 capsule (300 mg total) by mouth at bedtime. 30 capsule 1 03/27/2022   guaiFENesin 200 MG tablet Take 200 mg by mouth 2 (two) times daily.   03/28/2022   HYDROcodone-acetaminophen (NORCO) 7.5-325 MG tablet Take 1 tablet by mouth 4 (four) times daily.   03/28/2022   insulin aspart (NOVOLOG) 100 UNIT/ML injection Inject 2-10 Units into the skin See admin instructions. Sliding scale if 200-250 = 2 units, 251-300 = 4 units, 301-350 = 6 units, 351-400 = 8 units, 401-500 units = 10 units before meals and at betime   03/27/2022   ipratropium-albuterol (DUONEB) 0.5-2.5 (3) MG/3ML SOLN Take 3 mLs by nebulization every 4 (four) hours as needed (shortness of breath / wheezing).   03/27/2022   LANTUS 100 UNIT/ML injection Inject 32 Units into the skin at bedtime.    03/27/2022   linezolid (ZYVOX) 600 MG tablet Take 600 mg by mouth 2 (two) times daily.   03/28/2022   magnesium oxide (MAG-OX) 400 MG tablet Take 400 mg by mouth daily.   03/28/2022   metoprolol tartrate (LOPRESSOR) 50 MG tablet Take 50 mg by mouth 2 (two) times daily.   03/28/2022   Nutritional Supplement LIQD Take 120 mLs by mouth daily. "House supplement"   03/27/2022   pantoprazole (PROTONIX) 40 MG tablet Take 1 tablet (40 mg total) by mouth daily before breakfast. 30 tablet 0    polyethylene glycol (MIRALAX / GLYCOLAX) 17 g packet Take 17 g by mouth daily. For constipation   03/27/2022   potassium bicarbonate (K-LYTE) 25 MEQ disintegrating tablet Take 25 mEq by mouth daily.   03/28/2022   senna (SENOKOT) 8.6 MG tablet Take 2 tablets by mouth 2 (two) times daily.   03/28/2022   tamsulosin (FLOMAX) 0.4 MG CAPS capsule Take 1 capsule (0.4 mg total) by mouth daily after supper. (Patient taking differently: Take 0.4 mg by mouth daily.) 30 capsule 2 03/28/2022   torsemide (DEMADEX) 20 MG tablet Take 2 tablets (40 mg total) by mouth 2 (two) times daily. (Patient taking differently: Take 80 mg by mouth 2 (two) times daily.)   03/28/2022     Assessment: 71 yo M with a history of HTN, HFpEF, COPD, CKD stage IV, T2DM, and anemia admitted on 03/28/22 with osteomyelitis s/p hardware removal on IV antibiotics. Pt developed new onset AFib w/ RVR/atrial flutter in setting of osteomyelitis and HF. Pt converted to NSR on 11/2, but has been flipping in and out of NSR to Atrial flutter. Decision has been made to initiate pt on anticoagulation for AFib. Pt was not taking any oral anticoagulation prior to admission. Pharmacy was consulted to dose and manage apixaban for  AFib.  Hgb 9.0, Plt 262 - stable No s/sx of bleeding noted.   Goal of Therapy:  Monitor platelets by anticoagulation protocol: Yes   Plan:  Discontinue subcutaneous heparin VTE ppx regimen Start apixaban '5mg'$  PO q12h  Monitor CBC and for s/sx of bleeding  Copay cost to patient is $0/mo for apixaban Pharmacy to provide apixaban education to patient prior to discharge.  Luisa Hart, PharmD, BCPS Clinical Pharmacist 04/10/2022 10:13 AM   Please refer to AMION for pharmacy phone number;a

## 2022-04-10 NOTE — Plan of Care (Signed)
  Problem: Education: Goal: Knowledge of General Education information will improve Description: Including pain rating scale, medication(s)/side effects and non-pharmacologic comfort measures Outcome: Progressing   Problem: Activity: Goal: Risk for activity intolerance will decrease Outcome: Progressing   Problem: Pain Managment: Goal: General experience of comfort will improve Outcome: Progressing   

## 2022-04-11 DIAGNOSIS — S81802S Unspecified open wound, left lower leg, sequela: Secondary | ICD-10-CM | POA: Diagnosis not present

## 2022-04-11 LAB — CBC
HCT: 29.5 % — ABNORMAL LOW (ref 39.0–52.0)
Hemoglobin: 9.5 g/dL — ABNORMAL LOW (ref 13.0–17.0)
MCH: 30 pg (ref 26.0–34.0)
MCHC: 32.2 g/dL (ref 30.0–36.0)
MCV: 93.1 fL (ref 80.0–100.0)
Platelets: 264 10*3/uL (ref 150–400)
RBC: 3.17 MIL/uL — ABNORMAL LOW (ref 4.22–5.81)
RDW: 14.2 % (ref 11.5–15.5)
WBC: 13.1 10*3/uL — ABNORMAL HIGH (ref 4.0–10.5)
nRBC: 0 % (ref 0.0–0.2)

## 2022-04-11 LAB — BASIC METABOLIC PANEL
Anion gap: 12 (ref 5–15)
BUN: 90 mg/dL — ABNORMAL HIGH (ref 8–23)
CO2: 27 mmol/L (ref 22–32)
Calcium: 8.6 mg/dL — ABNORMAL LOW (ref 8.9–10.3)
Chloride: 98 mmol/L (ref 98–111)
Creatinine, Ser: 3.12 mg/dL — ABNORMAL HIGH (ref 0.61–1.24)
GFR, Estimated: 21 mL/min — ABNORMAL LOW (ref >60)
Glucose, Bld: 333 mg/dL — ABNORMAL HIGH (ref 70–99)
Potassium: 5.6 mmol/L — ABNORMAL HIGH (ref 3.5–5.1)
Sodium: 137 mmol/L (ref 135–145)

## 2022-04-11 LAB — GLUCOSE, CAPILLARY
Glucose-Capillary: 315 mg/dL — ABNORMAL HIGH (ref 70–99)
Glucose-Capillary: 237 mg/dL — ABNORMAL HIGH (ref 70–99)

## 2022-04-11 MED ORDER — MEROPENEM 1 G IV SOLR: 1.0000 g | Freq: Two times a day (BID) | INTRAVENOUS | 0 refills | Status: DC

## 2022-04-11 MED ORDER — SODIUM ZIRCONIUM CYCLOSILICATE 10 G PO PACK
10.0000 g | PACK | Freq: Every day | ORAL | Status: AC
  Administered 2022-04-11: 10 g via ORAL
  Filled 2022-04-11: qty 1

## 2022-04-11 MED ORDER — DAPTOMYCIN 500 MG IV SOLR: 750.0000 mg | INTRAVENOUS | 0 refills | Status: DC

## 2022-04-11 NOTE — Progress Notes (Signed)
Report called to [facility], no questions asked after report.

## 2022-04-11 NOTE — TOC Transition Note (Signed)
Transition of Care [facility]) - CM/SW Discharge Note   Patient Details  Name: Eric Scott MRN: [id number] Date of Birth: 06/26/1950  Transition of Care [facility]) CM/SW Contact:  Bethann Berkshire, [place] Phone Number: 04/11/2022, 11:36 AM   Clinical Narrative:     SNF liaison clarified how IV meds need to be written. MD/Pharmacy adjusted how meds were written. DC summary sent to SNF and they confirmed pt can admit today with no other changes needed.   Patient will DC to: [facility] Anticipated DC date: 04/11/22 Family notified: pt to notify family Transport by: Corey Harold     Per MD patient ready for DC to [facility]. RN, patient, and facility notified of DC. Discharge Summary and FL2 sent to facility. RN to call report prior to discharge [phone] ). DC packet on chart. Ambulance transport requested for patient.    CSW will sign off for now as social work intervention is no longer needed. Please consult [place] again if new needs arise.   Final next level of care: Skilled Nursing Facility Barriers to Discharge: Other (must enter comment) (needing to clarify how IV meds need to be written)   Patient Goals and CMS Choice Patient states their goals for this hospitalization and ongoing recovery are:: walk one of these days   Choice offered to / list presented to : Patient (Pt [facility] at [facility] and wants to return)  Discharge Placement              Patient chooses bed at:  [facility]) Patient to be transferred to facility by: [place] Name of family member notified: pt will notify family Patient and family notified of of transfer: 04/10/22  Discharge Plan and Services In-house Referral: Clinical Social Work   Post Acute Care Choice: Resumption of Svcs/PTA Provider                               Social Determinants of Health (SDOH) Interventions     Readmission Risk Interventions     No data to display

## 2022-04-11 NOTE — Progress Notes (Signed)
Patient was seen and examined.  Discussed with RN staff-no major issues overnight. Lying almost flat in bed-on 3-4 L of oxygen.  Appears very comfortable. Chest-clear to auscultation Please see discharge summary done by Dr. Waldron Labs on 11/7-medications on discharge summary appears up-to-date.  Stable for discharge from my point of view to SNF today.

## 2022-04-11 NOTE — Plan of Care (Signed)

## 2022-04-11 NOTE — TOC Progression Note (Signed)
Transition of Care [facility]) - Progression Note    Patient Details  Name: Eric Scott MRN: [id number] Date of Birth: Aug 27, 1950  Transition of Care [facility]) CM/SW [place], [place] Phone Number: 04/11/2022, 9:59 AM  Clinical Narrative:    [id number]:   CSW is notified by bedside RN that [facility] RN, Ebony Hail, called stating they do not do IVPB or push meds, only regular drip bags. CSW notified Jackelyn Poling, [facility] liaison, who stated she was unaware that this was an issue. She attempted to clarify with Ebony Hail how the meds needed to be written on DC as attending wanted clarification on how they needed the IV meds written. Unable to reach a conclusion so CSW and attending called Ebony Hail directly. Unable to reach a conclusion on how they want the meds written. CSW and attending then called [place] that [facility] uses. Attending spoke with pharmacist who explained RN staff at [facility] just needs to send attendings orders and they would fill the meds into regular drip bags that they use with instructions for the staff at SNF. CSW and MD called Ebony Hail, SNF RN, back and explained what pharmacist said. Still unable to reach a resolution. At this point [place], SNF liaison, was driving to SNF to speak with nurses there. She called CSW back and explained to hold off on DC until SNF Nursing Director can better clarify what is needed tomorrow. CSW is also notified by attending that pt is short of breath, will hold for DC tomorrow. CSW called and cancelled PTAR.    Expected Discharge Plan: Skilled Nursing Facility Barriers to Discharge: Other (must enter comment) (needing to clarify how IV meds need to be written)  Expected Discharge Plan and Services Expected Discharge Plan: [place] In-house Referral: Clinical Social Work   Post Acute Care Choice: Resumption of Svcs/PTA Provider Living arrangements for the past 2 months: [place] Expected Discharge Date: 04/11/22                                     Social Determinants of Health (SDOH) Interventions    Readmission Risk Interventions     No data to display

## 2022-04-12 ENCOUNTER — Encounter (HOSPITAL_COMMUNITY): Payer: Self-pay | Admitting: Emergency Medicine

## 2022-04-12 ENCOUNTER — Inpatient Hospital Stay (HOSPITAL_COMMUNITY): Payer: Medicare (Managed Care)

## 2022-04-12 ENCOUNTER — Emergency Department (HOSPITAL_COMMUNITY): Payer: Medicare (Managed Care)

## 2022-04-12 ENCOUNTER — Inpatient Hospital Stay (HOSPITAL_COMMUNITY)
Admission: EM | Admit: 2022-04-12 | Discharge: 2022-04-20 | DRG: 871 | Disposition: A | Discharge status: Skilled Nursing Facility | Payer: Medicare (Managed Care) | Attending: Internal Medicine | Admitting: Internal Medicine

## 2022-04-12 DIAGNOSIS — E1122 Type 2 diabetes mellitus with diabetic chronic kidney disease: Secondary | ICD-10-CM | POA: Diagnosis present

## 2022-04-12 DIAGNOSIS — M86172 Other acute osteomyelitis, left ankle and foot: Secondary | ICD-10-CM | POA: Diagnosis present

## 2022-04-12 DIAGNOSIS — M109 Gout, unspecified: Secondary | ICD-10-CM | POA: Diagnosis present

## 2022-04-12 DIAGNOSIS — R6520 Severe sepsis without septic shock: Secondary | ICD-10-CM | POA: Diagnosis present

## 2022-04-12 DIAGNOSIS — N179 Acute kidney failure, unspecified: Secondary | ICD-10-CM | POA: Diagnosis present

## 2022-04-12 DIAGNOSIS — D631 Anemia in chronic kidney disease: Secondary | ICD-10-CM | POA: Diagnosis present

## 2022-04-12 DIAGNOSIS — F39 Unspecified mood [affective] disorder: Secondary | ICD-10-CM | POA: Diagnosis present

## 2022-04-12 DIAGNOSIS — A4102 Sepsis due to Methicillin resistant Staphylococcus aureus: Principal | ICD-10-CM | POA: Diagnosis present

## 2022-04-12 DIAGNOSIS — J690 Pneumonitis due to inhalation of food and vomit: Secondary | ICD-10-CM | POA: Diagnosis present

## 2022-04-12 DIAGNOSIS — E1151 Type 2 diabetes mellitus with diabetic peripheral angiopathy without gangrene: Secondary | ICD-10-CM | POA: Diagnosis present

## 2022-04-12 DIAGNOSIS — I10 Essential (primary) hypertension: Secondary | ICD-10-CM | POA: Diagnosis present

## 2022-04-12 DIAGNOSIS — I70229 Atherosclerosis of native arteries of extremities with rest pain, unspecified extremity: Secondary | ICD-10-CM | POA: Diagnosis present

## 2022-04-12 DIAGNOSIS — J9600 Acute respiratory failure, unspecified whether with hypoxia or hypercapnia: Secondary | ICD-10-CM | POA: Diagnosis not present

## 2022-04-12 DIAGNOSIS — E66813 Obesity, class 3: Secondary | ICD-10-CM | POA: Diagnosis present

## 2022-04-12 DIAGNOSIS — I739 Peripheral vascular disease, unspecified: Secondary | ICD-10-CM | POA: Diagnosis present

## 2022-04-12 DIAGNOSIS — E1169 Type 2 diabetes mellitus with other specified complication: Secondary | ICD-10-CM | POA: Diagnosis present

## 2022-04-12 DIAGNOSIS — N400 Enlarged prostate without lower urinary tract symptoms: Secondary | ICD-10-CM | POA: Diagnosis present

## 2022-04-12 DIAGNOSIS — Z86718 Personal history of other venous thrombosis and embolism: Secondary | ICD-10-CM

## 2022-04-12 DIAGNOSIS — J441 Chronic obstructive pulmonary disease with (acute) exacerbation: Secondary | ICD-10-CM | POA: Diagnosis present

## 2022-04-12 DIAGNOSIS — N184 Chronic kidney disease, stage 4 (severe): Secondary | ICD-10-CM | POA: Diagnosis present

## 2022-04-12 DIAGNOSIS — J9621 Acute and chronic respiratory failure with hypoxia: Secondary | ICD-10-CM | POA: Diagnosis present

## 2022-04-12 DIAGNOSIS — Z9981 Dependence on supplemental oxygen: Secondary | ICD-10-CM

## 2022-04-12 DIAGNOSIS — I5033 Acute on chronic diastolic (congestive) heart failure: Secondary | ICD-10-CM | POA: Diagnosis present

## 2022-04-12 DIAGNOSIS — Z79899 Other long term (current) drug therapy: Secondary | ICD-10-CM

## 2022-04-12 DIAGNOSIS — E875 Hyperkalemia: Secondary | ICD-10-CM | POA: Diagnosis not present

## 2022-04-12 DIAGNOSIS — I480 Paroxysmal atrial fibrillation: Secondary | ICD-10-CM | POA: Diagnosis present

## 2022-04-12 DIAGNOSIS — R531 Weakness: Secondary | ICD-10-CM

## 2022-04-12 DIAGNOSIS — Z7901 Long term (current) use of anticoagulants: Secondary | ICD-10-CM

## 2022-04-12 DIAGNOSIS — K219 Gastro-esophageal reflux disease without esophagitis: Secondary | ICD-10-CM | POA: Diagnosis present

## 2022-04-12 DIAGNOSIS — Z6838 Body mass index (BMI) 38.0-38.9, adult: Secondary | ICD-10-CM

## 2022-04-12 DIAGNOSIS — Z87891 Personal history of nicotine dependence: Secondary | ICD-10-CM

## 2022-04-12 DIAGNOSIS — E7800 Pure hypercholesterolemia, unspecified: Secondary | ICD-10-CM | POA: Diagnosis present

## 2022-04-12 DIAGNOSIS — Z794 Long term (current) use of insulin: Secondary | ICD-10-CM

## 2022-04-12 DIAGNOSIS — A419 Sepsis, unspecified organism: Secondary | ICD-10-CM | POA: Diagnosis present

## 2022-04-12 DIAGNOSIS — Z888 Allergy status to other drugs, medicaments and biological substances status: Secondary | ICD-10-CM

## 2022-04-12 DIAGNOSIS — I4892 Unspecified atrial flutter: Secondary | ICD-10-CM | POA: Diagnosis present

## 2022-04-12 DIAGNOSIS — E1142 Type 2 diabetes mellitus with diabetic polyneuropathy: Secondary | ICD-10-CM | POA: Diagnosis present

## 2022-04-12 DIAGNOSIS — J9601 Acute respiratory failure with hypoxia: Secondary | ICD-10-CM

## 2022-04-12 DIAGNOSIS — Z1152 Encounter for screening for COVID-19: Secondary | ICD-10-CM | POA: Diagnosis not present

## 2022-04-12 DIAGNOSIS — Z792 Long term (current) use of antibiotics: Secondary | ICD-10-CM

## 2022-04-12 DIAGNOSIS — Z7951 Long term (current) use of inhaled steroids: Secondary | ICD-10-CM

## 2022-04-12 DIAGNOSIS — I5032 Chronic diastolic (congestive) heart failure: Secondary | ICD-10-CM | POA: Diagnosis present

## 2022-04-12 DIAGNOSIS — Z91018 Allergy to other foods: Secondary | ICD-10-CM

## 2022-04-12 DIAGNOSIS — G8929 Other chronic pain: Secondary | ICD-10-CM | POA: Diagnosis present

## 2022-04-12 DIAGNOSIS — R0602 Shortness of breath: Secondary | ICD-10-CM | POA: Diagnosis present

## 2022-04-12 DIAGNOSIS — I130 Hypertensive heart and chronic kidney disease with heart failure and stage 1 through stage 4 chronic kidney disease, or unspecified chronic kidney disease: Secondary | ICD-10-CM | POA: Diagnosis present

## 2022-04-12 DIAGNOSIS — I82621 Acute embolism and thrombosis of deep veins of right upper extremity: Secondary | ICD-10-CM | POA: Diagnosis present

## 2022-04-12 LAB — CBC WITH DIFFERENTIAL/PLATELET
Abs Immature Granulocytes: 0.53 10*3/uL — ABNORMAL HIGH (ref 0.00–0.07)
Basophils Absolute: 0.1 10*3/uL (ref 0.0–0.1)
Basophils Relative: 0 %
Eosinophils Absolute: 2.1 10*3/uL — ABNORMAL HIGH (ref 0.0–0.5)
Eosinophils Relative: 10 %
HCT: 32.2 % — ABNORMAL LOW (ref 39.0–52.0)
Hemoglobin: 10.4 g/dL — ABNORMAL LOW (ref 13.0–17.0)
Immature Granulocytes: 3 %
Lymphocytes Relative: 6 %
Lymphs Abs: 1.2 10*3/uL (ref 0.7–4.0)
MCH: 30.3 pg (ref 26.0–34.0)
MCHC: 32.3 g/dL (ref 30.0–36.0)
MCV: 93.9 fL (ref 80.0–100.0)
Monocytes Absolute: 1 10*3/uL (ref 0.1–1.0)
Monocytes Relative: 5 %
Neutro Abs: 15.2 10*3/uL — ABNORMAL HIGH (ref 1.7–7.7)
Neutrophils Relative %: 76 %
Platelets: 388 10*3/uL (ref 150–400)
RBC: 3.43 MIL/uL — ABNORMAL LOW (ref 4.22–5.81)
RDW: 14.7 % (ref 11.5–15.5)
WBC: 20.1 10*3/uL — ABNORMAL HIGH (ref 4.0–10.5)
nRBC: 0 % (ref 0.0–0.2)

## 2022-04-12 LAB — COMPREHENSIVE METABOLIC PANEL
ALT: 12 U/L (ref 0–44)
AST: 12 U/L — ABNORMAL LOW (ref 15–41)
Albumin: 2.9 g/dL — ABNORMAL LOW (ref 3.5–5.0)
Alkaline Phosphatase: 62 U/L (ref 38–126)
Anion gap: 11 (ref 5–15)
BUN: 92 mg/dL — ABNORMAL HIGH (ref 8–23)
CO2: 28 mmol/L (ref 22–32)
Calcium: 8.4 mg/dL — ABNORMAL LOW (ref 8.9–10.3)
Chloride: 100 mmol/L (ref 98–111)
Creatinine, Ser: 3.19 mg/dL — ABNORMAL HIGH (ref 0.61–1.24)
GFR, Estimated: 20 mL/min — ABNORMAL LOW (ref >60)
Glucose, Bld: 206 mg/dL — ABNORMAL HIGH (ref 70–99)
Potassium: 4.3 mmol/L (ref 3.5–5.1)
Sodium: 139 mmol/L (ref 135–145)
Total Bilirubin: 0.7 mg/dL (ref 0.3–1.2)
Total Protein: 6.7 g/dL (ref 6.5–8.1)

## 2022-04-12 LAB — LACTIC ACID, PLASMA
Lactic Acid, Venous: 1.2 mmol/L (ref 0.5–1.9)
Lactic Acid, Venous: 1.1 mmol/L (ref 0.5–1.9)

## 2022-04-12 LAB — PHOSPHORUS: Phosphorus: 4.6 mg/dL (ref 2.5–4.6)

## 2022-04-12 LAB — PROTIME-INR
INR: 1.4 — ABNORMAL HIGH (ref 0.8–1.2)
Prothrombin Time: 16.6 seconds — ABNORMAL HIGH (ref 11.4–15.2)

## 2022-04-12 LAB — BRAIN NATRIURETIC PEPTIDE: B Natriuretic Peptide: 502 pg/mL — ABNORMAL HIGH (ref 0.0–100.0)

## 2022-04-12 LAB — TROPONIN I (HIGH SENSITIVITY)
Troponin I (High Sensitivity): 25 ng/L — ABNORMAL HIGH (ref <18)
Troponin I (High Sensitivity): 21 ng/L — ABNORMAL HIGH (ref <18)

## 2022-04-12 LAB — MAGNESIUM: Magnesium: 2 mg/dL (ref 1.7–2.4)

## 2022-04-12 LAB — SARS CORONAVIRUS 2 BY RT PCR: SARS Coronavirus 2 by RT PCR: NEGATIVE

## 2022-04-12 LAB — GLUCOSE, CAPILLARY: Glucose-Capillary: 393 mg/dL — ABNORMAL HIGH (ref 70–99)

## 2022-04-12 MED ORDER — ACETAMINOPHEN 325 MG PO TABS: 650.0000 mg | ORAL_TABLET | Freq: Four times a day (QID) | ORAL | Status: DC | PRN

## 2022-04-12 MED ORDER — POLYETHYLENE GLYCOL 3350 17 G PO PACK
17.0000 g | PACK | Freq: Every day | ORAL | Status: DC
  Filled 2022-04-12 (×5): qty 1

## 2022-04-12 MED ORDER — HYDROCODONE-ACETAMINOPHEN 7.5-325 MG PO TABS
1.0000 | ORAL_TABLET | Freq: Four times a day (QID) | ORAL | Status: DC
  Administered 2022-04-12 – 2022-04-20 (×31): 1 via ORAL
  Filled 2022-04-12 (×31): qty 1

## 2022-04-12 MED ORDER — MAGNESIUM OXIDE -MG SUPPLEMENT 400 (240 MG) MG PO TABS
400.0000 mg | ORAL_TABLET | Freq: Every day | ORAL | Status: DC
  Administered 2022-04-12 – 2022-04-20 (×9): 400 mg via ORAL
  Filled 2022-04-12 (×10): qty 1

## 2022-04-12 MED ORDER — FUROSEMIDE 10 MG/ML IJ SOLN
40.0000 mg | Freq: Once | INTRAMUSCULAR | Status: AC
  Administered 2022-04-12: 40 mg via INTRAVENOUS
  Filled 2022-04-12: qty 4

## 2022-04-12 MED ORDER — SODIUM CHLORIDE 0.9 % IV SOLN
2.0000 g | Freq: Once | INTRAVENOUS | Status: AC
  Administered 2022-04-12: 2 g via INTRAVENOUS
  Filled 2022-04-12: qty 12.5

## 2022-04-12 MED ORDER — SODIUM CHLORIDE 0.9% FLUSH
3.0000 mL | Freq: Two times a day (BID) | INTRAVENOUS | Status: DC
  Administered 2022-04-12 – 2022-04-20 (×13): 3 mL via INTRAVENOUS

## 2022-04-12 MED ORDER — ONDANSETRON HCL 4 MG PO TABS: 4.0000 mg | ORAL_TABLET | Freq: Four times a day (QID) | ORAL | Status: DC | PRN

## 2022-04-12 MED ORDER — FERROUS SULFATE 325 (65 FE) MG PO TABS
325.0000 mg | ORAL_TABLET | Freq: Two times a day (BID) | ORAL | Status: DC
  Administered 2022-04-12 – 2022-04-20 (×17): 325 mg via ORAL
  Filled 2022-04-12 (×17): qty 1

## 2022-04-12 MED ORDER — SODIUM CHLORIDE 0.9 % IV SOLN: INTRAVENOUS | Status: DC

## 2022-04-12 MED ORDER — TAMSULOSIN HCL 0.4 MG PO CAPS
0.4000 mg | ORAL_CAPSULE | Freq: Every day | ORAL | Status: DC
  Administered 2022-04-12 – 2022-04-20 (×9): 0.4 mg via ORAL
  Filled 2022-04-12 (×9): qty 1

## 2022-04-12 MED ORDER — SODIUM CHLORIDE 0.9 % IV SOLN: 250.0000 mL | INTRAVENOUS | Status: DC | PRN

## 2022-04-12 MED ORDER — LEVALBUTEROL HCL 0.63 MG/3ML IN NEBU: 0.6300 mg | INHALATION_SOLUTION | Freq: Four times a day (QID) | RESPIRATORY_TRACT | Status: DC | PRN

## 2022-04-12 MED ORDER — BISACODYL 5 MG PO TBEC: 5.0000 mg | DELAYED_RELEASE_TABLET | Freq: Every day | ORAL | Status: DC | PRN

## 2022-04-12 MED ORDER — METOLAZONE 2.5 MG PO TABS
2.5000 mg | ORAL_TABLET | ORAL | Status: DC
  Administered 2022-04-13: 2.5 mg via ORAL
  Filled 2022-04-12: qty 1

## 2022-04-12 MED ORDER — LINEZOLID 600 MG/300ML IV SOLN
600.0000 mg | Freq: Two times a day (BID) | INTRAVENOUS | Status: DC
  Administered 2022-04-12 (×2): 600 mg via INTRAVENOUS
  Filled 2022-04-12 (×6): qty 300

## 2022-04-12 MED ORDER — METHYLPREDNISOLONE SODIUM SUCC 40 MG IJ SOLR
40.0000 mg | Freq: Every day | INTRAMUSCULAR | Status: DC
  Administered 2022-04-13: 40 mg via INTRAVENOUS
  Filled 2022-04-12 (×2): qty 1

## 2022-04-12 MED ORDER — AMIODARONE HCL 200 MG PO TABS
200.0000 mg | ORAL_TABLET | Freq: Two times a day (BID) | ORAL | Status: AC
  Administered 2022-04-12 – 2022-04-18 (×14): 200 mg via ORAL
  Filled 2022-04-12 (×14): qty 1

## 2022-04-12 MED ORDER — TRAZODONE HCL 50 MG PO TABS: 25.0000 mg | ORAL_TABLET | Freq: Every evening | ORAL | Status: DC | PRN

## 2022-04-12 MED ORDER — IPRATROPIUM-ALBUTEROL 0.5-2.5 (3) MG/3ML IN SOLN
3.0000 mL | RESPIRATORY_TRACT | Status: DC | PRN
  Administered 2022-04-12: 3 mL via RESPIRATORY_TRACT
  Filled 2022-04-12: qty 3

## 2022-04-12 MED ORDER — SODIUM CHLORIDE 0.9 % IV SOLN
1.0000 g | Freq: Two times a day (BID) | INTRAVENOUS | Status: DC
  Administered 2022-04-12 – 2022-04-20 (×17): 1 g via INTRAVENOUS
  Filled 2022-04-12 (×20): qty 20

## 2022-04-12 MED ORDER — APIXABAN 5 MG PO TABS
5.0000 mg | ORAL_TABLET | Freq: Two times a day (BID) | ORAL | Status: DC
  Administered 2022-04-12 – 2022-04-20 (×17): 5 mg via ORAL
  Filled 2022-04-12 (×17): qty 1

## 2022-04-12 MED ORDER — METHYLPREDNISOLONE SODIUM SUCC 125 MG IJ SOLR
125.0000 mg | Freq: Once | INTRAMUSCULAR | Status: AC
  Administered 2022-04-12: 125 mg via INTRAVENOUS
  Filled 2022-04-12: qty 2

## 2022-04-12 MED ORDER — HEPARIN SODIUM (PORCINE) 5000 UNIT/ML IJ SOLN: 5000.0000 [IU] | Freq: Three times a day (TID) | INTRAMUSCULAR | Status: DC

## 2022-04-12 MED ORDER — SODIUM CHLORIDE 0.9% FLUSH
3.0000 mL | INTRAVENOUS | Status: DC | PRN
  Administered 2022-04-17: 3 mL via INTRAVENOUS

## 2022-04-12 MED ORDER — HYDRALAZINE HCL 20 MG/ML IJ SOLN: 10.0000 mg | INTRAMUSCULAR | Status: DC | PRN

## 2022-04-12 MED ORDER — MEROPENEM 1 G IV SOLR
1.0000 g | Freq: Two times a day (BID) | INTRAVENOUS | Status: DC
  Filled 2022-04-12 (×3): qty 20

## 2022-04-12 MED ORDER — FLEET ENEMA 7-19 GM/118ML RE ENEM: 1.0000 | ENEMA | Freq: Once | RECTAL | Status: DC | PRN

## 2022-04-12 MED ORDER — IPRATROPIUM BROMIDE 0.02 % IN SOLN: 0.5000 mg | Freq: Four times a day (QID) | RESPIRATORY_TRACT | Status: DC | PRN

## 2022-04-12 MED ORDER — SODIUM CHLORIDE 0.9% FLUSH
3.0000 mL | Freq: Two times a day (BID) | INTRAVENOUS | Status: DC
  Administered 2022-04-12 – 2022-04-20 (×9): 3 mL via INTRAVENOUS

## 2022-04-12 MED ORDER — GABAPENTIN 300 MG PO CAPS
300.0000 mg | ORAL_CAPSULE | Freq: Every day | ORAL | Status: DC
  Administered 2022-04-12 – 2022-04-19 (×8): 300 mg via ORAL
  Filled 2022-04-12 (×8): qty 1

## 2022-04-12 MED ORDER — SENNA 8.6 MG PO TABS
2.0000 | ORAL_TABLET | Freq: Two times a day (BID) | ORAL | Status: DC
  Administered 2022-04-12 – 2022-04-18 (×5): 17.2 mg via ORAL
  Filled 2022-04-12 (×14): qty 2

## 2022-04-12 MED ORDER — TORSEMIDE 20 MG PO TABS
80.0000 mg | ORAL_TABLET | Freq: Two times a day (BID) | ORAL | Status: DC
  Administered 2022-04-12 – 2022-04-13 (×3): 80 mg via ORAL
  Filled 2022-04-12 (×4): qty 4

## 2022-04-12 MED ORDER — INSULIN ASPART 100 UNIT/ML IJ SOLN
0.0000 [IU] | Freq: Three times a day (TID) | INTRAMUSCULAR | Status: DC
  Administered 2022-04-13: 20 [IU] via SUBCUTANEOUS
  Administered 2022-04-13: 7 [IU] via SUBCUTANEOUS
  Administered 2022-04-13: 4 [IU] via SUBCUTANEOUS
  Administered 2022-04-14: 11 [IU] via SUBCUTANEOUS
  Administered 2022-04-14: 15 [IU] via SUBCUTANEOUS
  Administered 2022-04-14: 7 [IU] via SUBCUTANEOUS
  Administered 2022-04-14: 3 [IU] via SUBCUTANEOUS
  Administered 2022-04-15: 4 [IU] via SUBCUTANEOUS
  Administered 2022-04-15: 7 [IU] via SUBCUTANEOUS
  Administered 2022-04-15 (×2): 4 [IU] via SUBCUTANEOUS
  Administered 2022-04-16: 11 [IU] via SUBCUTANEOUS
  Administered 2022-04-16: 4 [IU] via SUBCUTANEOUS
  Administered 2022-04-16: 7 [IU] via SUBCUTANEOUS
  Administered 2022-04-16 – 2022-04-17 (×2): 4 [IU] via SUBCUTANEOUS
  Administered 2022-04-17: 15 [IU] via SUBCUTANEOUS
  Administered 2022-04-17: 4 [IU] via SUBCUTANEOUS
  Administered 2022-04-17: 3 [IU] via SUBCUTANEOUS
  Administered 2022-04-18 (×2): 4 [IU] via SUBCUTANEOUS
  Administered 2022-04-18: 7 [IU] via SUBCUTANEOUS
  Administered 2022-04-18 – 2022-04-19 (×2): 4 [IU] via SUBCUTANEOUS

## 2022-04-12 MED ORDER — AMIODARONE HCL 200 MG PO TABS
200.0000 mg | ORAL_TABLET | Freq: Every day | ORAL | Status: DC
  Administered 2022-04-19 – 2022-04-20 (×2): 200 mg via ORAL
  Filled 2022-04-12 (×2): qty 1

## 2022-04-12 MED ORDER — ALPRAZOLAM 0.5 MG PO TABS
0.5000 mg | ORAL_TABLET | Freq: Every day | ORAL | Status: DC
  Administered 2022-04-12 – 2022-04-19 (×8): 0.5 mg via ORAL
  Filled 2022-04-12 (×8): qty 1

## 2022-04-12 MED ORDER — FLUTICASONE FUROATE-VILANTEROL 100-25 MCG/ACT IN AEPB
1.0000 | INHALATION_SPRAY | Freq: Every day | RESPIRATORY_TRACT | Status: DC
  Filled 2022-04-12: qty 28

## 2022-04-12 MED ORDER — VANCOMYCIN HCL IN DEXTROSE 1-5 GM/200ML-% IV SOLN
1000.0000 mg | Freq: Once | INTRAVENOUS | Status: AC
  Administered 2022-04-12: 1000 mg via INTRAVENOUS
  Filled 2022-04-12: qty 200

## 2022-04-12 MED ORDER — ALBUTEROL SULFATE (2.5 MG/3ML) 0.083% IN NEBU
INHALATION_SOLUTION | RESPIRATORY_TRACT | Status: AC
  Administered 2022-04-12: 5 mg/h via RESPIRATORY_TRACT
  Filled 2022-04-12: qty 6

## 2022-04-12 MED ORDER — POTASSIUM CHLORIDE CRYS ER 10 MEQ PO TBCR
20.0000 meq | EXTENDED_RELEASE_TABLET | Freq: Every day | ORAL | Status: DC
  Administered 2022-04-12 – 2022-04-13 (×2): 20 meq via ORAL
  Filled 2022-04-12 (×2): qty 2

## 2022-04-12 MED ORDER — OXYCODONE HCL 5 MG PO TABS
5.0000 mg | ORAL_TABLET | ORAL | Status: DC | PRN
  Administered 2022-04-15 – 2022-04-20 (×3): 5 mg via ORAL
  Filled 2022-04-12 (×3): qty 1

## 2022-04-12 MED ORDER — ALBUTEROL SULFATE (2.5 MG/3ML) 0.083% IN NEBU: 5.0000 mg/h | INHALATION_SOLUTION | Freq: Once | RESPIRATORY_TRACT | Status: AC

## 2022-04-12 MED ORDER — COLCHICINE 0.3 MG HALF TABLET
0.3000 mg | ORAL_TABLET | Freq: Every day | ORAL | Status: DC
  Administered 2022-04-12 – 2022-04-20 (×7): 0.3 mg via ORAL
  Filled 2022-04-12 (×2): qty 1
  Filled 2022-04-12: qty 0.5
  Filled 2022-04-12: qty 1
  Filled 2022-04-12: qty 0.5
  Filled 2022-04-12 (×2): qty 1
  Filled 2022-04-12: qty 0.5
  Filled 2022-04-12 (×2): qty 1

## 2022-04-12 MED ORDER — ONDANSETRON HCL 4 MG/2ML IJ SOLN: 4.0000 mg | Freq: Four times a day (QID) | INTRAMUSCULAR | Status: DC | PRN

## 2022-04-12 MED ORDER — AMLODIPINE BESYLATE 10 MG PO TABS
10.0000 mg | ORAL_TABLET | Freq: Every day | ORAL | Status: DC
  Administered 2022-04-12 – 2022-04-20 (×9): 10 mg via ORAL
  Filled 2022-04-12 (×5): qty 1
  Filled 2022-04-12: qty 2
  Filled 2022-04-12 (×3): qty 1

## 2022-04-12 MED ORDER — SENNOSIDES-DOCUSATE SODIUM 8.6-50 MG PO TABS: 1.0000 | ORAL_TABLET | Freq: Every evening | ORAL | Status: DC | PRN

## 2022-04-12 MED ORDER — ACETAMINOPHEN 650 MG RE SUPP: 650.0000 mg | Freq: Four times a day (QID) | RECTAL | Status: DC | PRN

## 2022-04-12 MED ORDER — FLUOXETINE HCL 20 MG PO CAPS
40.0000 mg | ORAL_CAPSULE | Freq: Every day | ORAL | Status: DC
  Administered 2022-04-12 – 2022-04-20 (×9): 40 mg via ORAL
  Filled 2022-04-12 (×9): qty 2

## 2022-04-12 MED ORDER — INSULIN GLARGINE-YFGN 100 UNIT/ML ~~LOC~~ SOLN
20.0000 [IU] | Freq: Every day | SUBCUTANEOUS | Status: DC
  Administered 2022-04-12 – 2022-04-20 (×9): 20 [IU] via SUBCUTANEOUS
  Filled 2022-04-12 (×10): qty 0.2

## 2022-04-12 MED ORDER — METOPROLOL TARTRATE 25 MG PO TABS
25.0000 mg | ORAL_TABLET | Freq: Two times a day (BID) | ORAL | Status: DC
  Administered 2022-04-12 – 2022-04-20 (×17): 25 mg via ORAL
  Filled 2022-04-12 (×17): qty 1

## 2022-04-12 MED ORDER — HYDROMORPHONE HCL 1 MG/ML IJ SOLN
0.5000 mg | INTRAMUSCULAR | Status: DC | PRN
  Administered 2022-04-12 – 2022-04-15 (×3): 1 mg via INTRAVENOUS
  Administered 2022-04-17: 0.5 mg via INTRAVENOUS
  Administered 2022-04-18: 1 mg via INTRAVENOUS
  Filled 2022-04-12: qty 1
  Filled 2022-04-12: qty 0.5
  Filled 2022-04-12 (×3): qty 1

## 2022-04-12 NOTE — Progress Notes (Signed)
Pharmacy Antibiotic Note  Eric Scott is a 71 y.o. male admitted on 04/12/2022 with    osteomyelitis of left ankle s/p debridement and removal of hardware on 03/28/22 .  Pharmacy has been consulted for Zyvox/Merrem dosing.  Plan: Zyvox 600 mg BID Merrem 1000 mg IV every 12 hours.  Height: 6' (182.9 cm) Weight: 129.5 kg (285 lb 7.9 oz) IBW/kg (Calculated) : 77.6  Temp (24hrs), Avg:97.9 F (36.6 C), Min:97.9 F (36.6 C), Max:97.9 F (36.6 C)  Recent Labs  Lab 04/08/22 0418 04/09/22 0155 04/10/22 0445 04/11/22 0545 04/12/22 0531 04/12/22 0632  WBC 7.2 9.6 12.1* 13.1* 20.1*  --   CREATININE 2.99* 3.03* 2.88* 3.12* 3.19*  --   LATICACIDVEN  --   --   --   --  1.2 1.1    Estimated Creatinine Clearance: 29.6 mL/min (A) (by C-G formula based on SCr of 3.19 mg/dL (H)).    Allergies  Allergen Reactions   Baclofen     unknown   Blueberry Flavor Other (See Comments)    Unknown   Cucumber Extract Other (See Comments)    "Fells like I'm having a heart attack"   Flexeril [Cyclobenzaprine Hcl] Other (See Comments)    "whole body  Tremors" (05/27/2012)   Kiwi Extract Other (See Comments)    "feels like I'm having a heart attack"    Antimicrobials this admission: Zyvox 11/9 >> Merrem 10/27 >> Dapto 10/27 >>11/8 Zyvox 10/25 >> 10/27   Microbiology results: 11/9 Bcx: pending 11/9 Sputum Cx: pending 10/25 Wound Cx: acinetobacter/morganella/enterococcus faecalis/MRSA  Thank you for allowing pharmacy to be a part of this patient's care.  Margot Ables, PharmD Clinical Pharmacist 04/12/2022 9:12 AM

## 2022-04-12 NOTE — ED Provider Notes (Signed)
Patient admitted to Dr Jamesetta Geralds hospitalist   Langston Masker, Carola Rhine, MD 04/12/22 [phone]

## 2022-04-12 NOTE — Hospital Course (Addendum)
Eric Scott is a 71 year old male with Pmh/o DM 2, morbid obesity, HTN, COPD, CHF, peripheral neuropathy, PVD, current osteomyelitis - open wound of left ankle -  on dual IV antibiotics was discharged yesterday to SNF.  Presenting once again in respiratory failure, shortness of breath, hypoxia. He was satting 83% on 4 L of nasal cannula, present on CPAP Apparently symptoms are lingering since yesterday, got worse this morning at the facility.  ED evaluation: Blood pressure (!) 146/91, pulse 78, temperature 97.9 Resp. rate 16, height 6' (1.829 m), weight 129.5 kg, SpO2 100 %.  On BiPAP Apparently patient was found hypoxic at the facility, biomass, in ED with remained on BiPAP saturation improved  Leukocytosis 13.1>>> 20.1 Lactic acid 1.2, 1.1, Creatinine 2.88  >>> 3.12, 3.19  Chest x-ray:New left upper lobe opacity is nonspecific but suspicious for Pneumonia.   Patient meets early sepsis criteria-for acute respiratory failure, leukocytosis, stable pneumonia, ?   Discussed the case with Dr. Sharol Given, and infectious disease Dr. Juleen China --we will reconsult on the patient Requested patient to be admitted to [facility]  =================================================    Assessment / Plan:   Principal Problem:   Sepsis ([name]) Active Problems:   Acute on chronic respiratory failure with hypoxia ([name])   Osteomyelitis of ankle or foot, acute, left ([name])   Stage 4 chronic kidney disease ([name])   Type 2 diabetes mellitus with stage 4 chronic kidney disease, with [name] current use of insulin ([name])   HTN (hypertension)   Diabetic polyneuropathy associated with type 2 diabetes mellitus ([name])   PVD (peripheral vascular disease) ([name])   Generalized weakness   Critical lower limb ischemia ([name])   Arm DVT (deep venous thromboembolism), acute, right ([name])   GERD (gastroesophageal reflux disease)   Obesity, Class III, BMI 40-49.9 (morbid obesity) ([name])   Atrial flutter, paroxysmal ([name])    Chronic heart failure with preserved ejection fraction (HFpEF) ([name])    Assessment & Plan:    Acute respiratory failure  -Multifactorial, possible pneumonia, underlying COPD  -Patient is baseline dependent on 4 L of oxygen -On arrival on 4 L it was satting 83% -Currently stable on BiPAP -Chest x-ray consistent with new onset pneumonia despite being on 2 antibiotics, worsening leukocytosis, but afebrile  -Continuing BiPAP-scheduling DuoNeb bronchodilators -Initiating IV steroids with anticipation of quick taper   Sepsis /pneumonia/osteomyelitis -Patient meets sepsis criteria due to acute on chronic respiratory failure, tachypnea, leukocytosis, With ongoing osteomyelitis infection, and presumed pneumonia  -Patient has been on dual IV antibiotics for osteomyelitis Daptomycin and imipenem Discussed the case with ID Dr. Juleen China was reviewed the case.   Recommending continuing imipenem and adding Zyvox -Discontinuing daptomycin for now -We will follow-up with blood cultures -On obtaining CT of the chest  -Patient blood pressure stable, lactic acid normal therefore abstaining from IV fluid resuscitation per sepsis protocol for now  leg wound, left, osteomyelitis -Patient with hardware infection with osteo -He is s/p hardware removal -Culture is growing Morganella, Acinetobacter, E faecalis, and MRSA -ID recommended 6 weeks of IV meropenem and daptomycin . -Line was placed 10/31 . -Wound VAC has been discontinued before discharge 11/6, patient to continue regular dressing changes   Dr. Sharol Given -Case was discussed he would like to reevaluate for possible further debridement     Acute on chronic respiratory failure with hypoxia ([name]) COPD exacerbation -Patient with COPD, on 3-4 L home O2,  -Currently respiratory failure, requiring BiPAP - he was treated with IV diuresis, adding IV steroids  -  continue home dose torsemide,   COPD exacerbation -Continue as above    Atrial  fibrillation, new diagnosis  - Cont amiodarone and metoprolol for heart rate control, he is    Anemia of chronic kidney disease -Hemoglobin stable at 10.4 -1 unit PRBC 11/4. -Panel significant for normal ferritin, but low saturation and iron level, would like to avoid IV iron in the setting of infection, s- on p.o. supplements, he would likely benefit from Aranesp in the future once iron storage is repleted .   Hypertension -continue  current regimenl    Acute on chronic diastolic CHF -Mild volume overload on imaging, and significant oxygen requirement. -Continue home medication of Demadex -PRN IV Lasix and as needed Zaroxolyn,    Chronic pain (back, legs) -He has chronic pain in addition to acute pain -Continue Norco, Neurontin or as per orthopedics   Stage 4 chronic kidney disease ([name]) -Appears to be stable at this time -Attempt to avoid nephrotoxic medications -Continue calcitriol -Continue colchicine for gout   Mood disorder ([name]) -Continue fluoxetine, alprazolam   Paroxysmal atrial fibrillation, history of right arm DVT/peripheral vascular disease  -Continue home medication of Eliquis  type 2 diabetes mellitus with stage 4 chronic kidney disease, with [name] current use of insulin ([name]) -A1c is 5.8, indicating good control -On glargine and insulin sliding scale  -Checking CBG QA CHS

## 2022-04-12 NOTE — ED Provider Notes (Signed)
[facility] EMERGENCY DEPARTMENT Provider Note   CSN: [id number] Arrival date & time: 04/12/22  8250     History  No chief complaint on file.   Eric Scott is a 71 y.o. male.  Patient is a 71 year old male with extensive past medical history including type 2 diabetes, obesity, hypertension, COPD, congestive heart failure, peripheral neuropathy, peripheral vascular disease.  Patient discharged yesterday from [facility] after undergoing debridement related to osteomyelitis.  Patient developed shortness of breath postoperatively and required BiPAP for what was felt to be exacerbations of both CHF and COPD.  He was sent to a rehab facility yesterday, then this morning developed shortness of breath and was sent here.  Patient was placed on CPAP by EMS.  He adds little additional history secondary to acuity of condition and application of CPAP.  He is currently receiving meropenem and daptomycin via central line in the right subclavian.  The history is provided by the patient.       Home Medications Prior to Admission medications   Medication Sig Start Date End Date Taking? Authorizing Provider  acetaminophen (TYLENOL) 325 MG tablet Take 2 tablets (650 mg total) by mouth every 6 (six) hours as needed for mild pain, fever or headache (or Fever >/= 101). 04/10/22   Elgergawy, Silver Huguenin, MD  albuterol (PROVENTIL) (2.5 MG/3ML) 0.083% nebulizer solution Take 3 mLs (2.5 mg total) by nebulization every 2 (two) hours as needed for wheezing or shortness of breath. 04/10/22   Elgergawy, Silver Huguenin, MD  ALPRAZolam Duanne Moron) 0.5 MG tablet Take 1 tablet (0.5 mg total) by mouth at bedtime. 04/10/22   Elgergawy, Silver Huguenin, MD  Amino Acids-Protein Hydrolys (PRO-STAT AWC) LIQD Take 30 mLs by mouth daily.    [provider]  amiodarone (PACERONE) 200 MG tablet Continue amiodarone 200 mg BID or 8 days (16 more doses), then transition to amiodarone 200 mg daily. 04/10/22   Elgergawy, Silver Huguenin, MD  amLODipine  (NORVASC) 10 MG tablet Take 1 tablet (10 mg total) by mouth daily. 05/21/19   Roxan Hockey, MD  apixaban (ELIQUIS) 5 MG TABS tablet Take 1 tablet (5 mg total) by mouth 2 (two) times daily. 04/10/22   Elgergawy, Silver Huguenin, MD  ascorbic acid (VITAMIN C) 500 MG tablet Take 500 mg by mouth 2 (two) times daily.    [provider]  BREO ELLIPTA 100-25 MCG/INH AEPB Inhale 1 puff into the lungs daily.  09/27/16   [provider]  calcitRIOL (ROCALTROL) 0.25 MCG capsule Take 0.25 mcg by mouth daily.    [provider]  calcium carbonate (TUMS - DOSED IN MG ELEMENTAL CALCIUM) 500 MG chewable tablet Chew 2 tablets by mouth 2 (two) times daily.    [provider]  cholecalciferol (VITAMIN D) 1000 units tablet Take 1,000 Units by mouth daily.    [provider]  coconut oil OIL Apply 1 Application topically every 6 (six) hours as needed (protection and prevention).    [provider]  colchicine 0.6 MG tablet Take 0.5 tablets (0.3 mg total) by mouth daily. 04/10/22   Elgergawy, Silver Huguenin, MD  cyanocobalamin 1000 MCG tablet Take 1,000 mcg by mouth daily.    [provider]  DAPTOmycin (CUBICIN) 500 MG injection Inject 15 mLs (750 mg total) into the vein every other day for 28 days. Indication:  Polymicrobial L-ankle osteo First Dose: Yes Last Day of Therapy:  05/09/22 Labs - Once weekly:  CBC/D, BMP, and CPK Labs - Every other week:  ESR and CRP Rate: 130 ml/hr  Add 750 mg of Daptomycin to Normal Saline based on available bag volume. 04/11/22 05/09/22  GhimireHenreitta Leber, MD  ferrous sulfate 325 (65 FE) MG tablet Take 325 mg by mouth 2 (two) times daily.    [provider]  FLUoxetine (PROZAC) 40 MG capsule Take 40 mg by mouth daily.    [provider]  gabapentin (NEURONTIN) 300 MG capsule Take 1 capsule (300 mg total) by mouth at bedtime. 12/21/19   Barton Dubois, MD  guaiFENesin 200 MG tablet Take 200 mg by mouth 2 (two) times  daily.    [provider]  HYDROcodone-acetaminophen (NORCO) 7.5-325 MG tablet Take 1 tablet by mouth 4 (four) times daily. 04/10/22   Elgergawy, Silver Huguenin, MD  insulin aspart (NOVOLOG) 100 UNIT/ML injection Inject 2-10 Units into the skin See admin instructions. Sliding scale if 200-250 = 2 units, 251-300 = 4 units, 301-350 = 6 units, 351-400 = 8 units, 401-500 units = 10 units before meals and at betime    [provider]  ipratropium-albuterol (DUONEB) 0.5-2.5 (3) MG/3ML SOLN Take 3 mLs by nebulization every 4 (four) hours as needed (shortness of breath / wheezing).    [provider]  LANTUS 100 UNIT/ML injection Inject 0.22 mLs (22 Units total) into the skin at bedtime. 04/10/22   Elgergawy, Silver Huguenin, MD  magnesium oxide (MAG-OX) 400 MG tablet Take 400 mg by mouth daily.    [provider]  meropenem (MERREM) 1 g injection Inject 1 g into the vein every 12 (twelve) hours for 28 days. Indication:  Polymicrobial L-ankle osteo First Dose: Yes Last Day of Therapy:  05/09/22 Labs - Once weekly:  CBC/D and BMP, Labs - Every other week:  ESR and CRP Rate: 200 mL/hour 04/11/22 05/09/22  Jonetta Osgood, MD  metolazone (ZAROXOLYN) 2.5 MG tablet Take 1 tablet (2.5 mg total) by mouth every Monday, Wednesday, and Friday. 04/11/22   Elgergawy, Silver Huguenin, MD  metoprolol tartrate (LOPRESSOR) 25 MG tablet Take 1 tablet (25 mg total) by mouth 2 (two) times daily. 04/10/22   Elgergawy, Silver Huguenin, MD  Nutritional Supplement LIQD Take 120 mLs by mouth daily. "House supplement"    [provider]  pantoprazole (PROTONIX) 40 MG tablet Take 1 tablet (40 mg total) by mouth daily before breakfast. 05/21/19 03/27/22  Emokpae, Courage, MD  polyethylene glycol (MIRALAX / GLYCOLAX) 17 g packet Take 17 g by mouth daily. For constipation    [provider]  potassium bicarbonate (K-LYTE) 25 MEQ disintegrating tablet Take 25 mEq by mouth daily.    [provider]  senna  (SENOKOT) 8.6 MG tablet Take 2 tablets by mouth 2 (two) times daily.    [provider]  tamsulosin (FLOMAX) 0.4 MG CAPS capsule Take 1 capsule (0.4 mg total) by mouth daily after supper. Patient taking differently: Take 0.4 mg by mouth daily. 11/18/19   Roxan Hockey, MD  torsemide (DEMADEX) 20 MG tablet Take 4 tablets (80 mg total) by mouth 2 (two) times daily. 04/10/22   Elgergawy, Silver Huguenin, MD      Allergies    Baclofen, Blueberry flavor, Cucumber extract, Flexeril [cyclobenzaprine hcl], and Kiwi extract    Review of Systems   Review of Systems  All other systems reviewed and are negative.   Physical Exam Updated Vital Signs There were no vitals taken for this visit. Physical Exam Vitals and nursing note reviewed.  Constitutional:      General:  He is not in acute distress.    Appearance: He is well-developed. He is not diaphoretic.     Comments: Patient is an acutely on chronically ill-appearing male.  HENT:     Head: Normocephalic and atraumatic.  Cardiovascular:     Rate and Rhythm: Tachycardia present. Rhythm irregular.     Heart sounds: No murmur heard.    No friction rub.  Pulmonary:     Effort: Respiratory distress present.     Breath sounds: Normal breath sounds. No wheezing or rales.     Comments: Patient arrives with CPAP in place.  He is in moderate respiratory distress with rales in both bases. Abdominal:     General: Bowel sounds are normal. There is no distension.     Palpations: Abdomen is soft.     Tenderness: There is no abdominal tenderness.  Musculoskeletal:        General: Normal range of motion.     Cervical back: Normal range of motion and neck supple.  Skin:    General: Skin is warm and dry.  Neurological:     Mental Status: He is alert and oriented to person, place, and time.     Coordination: Coordination normal.     ED Results / Procedures / Treatments   Labs (all labs ordered are listed, but only abnormal results are  displayed) Labs Reviewed - No data to display  EKG EKG Interpretation  Date/Time:  Thursday April 12 2022 05:38:55 EST Ventricular Rate:  74 PR Interval:    QRS Duration: 116 QT Interval:  452 QTC Calculation: 502 R Axis:   -46 Text Interpretation: Atrial flutter with predominant 4:1 AV block Incomplete left bundle branch block Consider anterior infarct Confirmed by Veryl Speak [phone]) on 04/12/2022 5:48:09 AM  Radiology No results found.  Procedures Procedures    Medications Ordered in ED Medications - No data to display  ED Course/ Medical Decision Making/ A&P  Patient is a 71 year old male with extensive past medical history as described in the HPI.  Patient presenting today with complaints of shortness of breath requiring CPAP placement by EMS.  Patient was just discharged yesterday from [facility] after undergoing debridement of suspected osteomyelitis of the left ankle and postoperatively going into congestive heart failure/COPD.  Patient arrives here in moderate respiratory distress.  He is on CPAP with rhonchorous breath sounds.  Vital signs are otherwise stable.  Oxygen saturations in the mid 90s on CPAP.  Patient then placed on BiPAP.  Work-up initiated including CBC, CMP, BNP and troponin results significant for a white count of 21,000, BNP of 500.  Patient sent for chest x-ray showing what appears to be a new infiltrate in the left upper lobe.  Patient currently receiving meropenem and daptomycin for his ankle osteomyelitis.  Additional antibiotics to be discussed with hospitalist as patient will require admission.  Final Clinical Impression(s) / ED Diagnoses Final diagnoses:  None    Rx / DC Orders ED Discharge Orders     None         Veryl Speak, MD 04/15/22 2300

## 2022-04-12 NOTE — ED Notes (Signed)
One lumen of CVC is completely occluded and will not flush or provide blood return. MD notified. Will await orders.

## 2022-04-12 NOTE — Consult Note (Addendum)
[facility] Nurse Consult Note: Reason for Consult:Left lateral malleolus with osteomyelitis around hardware.  Likely transfer to [facility].  Will disconnect from hospital unit upon transfer, leave dressing in place and attach to hospital unit on arrival to [facility] type:infectious Pressure Injury POA: NA Measurement: Suture line with nonapproximated edges Wound bed: not visible, Kerecis graft in place- photo in chart Drainage (amount, consistency, odor) serosanguinous  Periwound: intact  dry skin Dressing procedure/placement/frequency: Mon.Wed.Fri VAC changes along suture line.  Will be changed again 04/16/22 Will not follow at this time.  Please re-consult if needed.  Estrellita Ludwig MSN, RN, FNP-BC CWON Wound, Ostomy, Continence Nurse [facility] [phone] Pager [phone]

## 2022-04-12 NOTE — ED Triage Notes (Signed)
Pt BIB [facility] from [facility]. Pt c/o chest pain and shortness of breath, Pt was 83% on 4L [place]. Pt arrives 91% on CPAP.   Pt recently admitted to [facility] for osteomyelitis of left ankle. Pt discharged on IV abx through central line in right chest.

## 2022-04-12 NOTE — H&P (Signed)
History and Physical   Patient: Eric Scott                            PCP: [name], MD                    DOB: 1951-05-13            DOA: 04/12/2022 FGH:[id number]             DOS: 04/12/2022, 9:02 AM  [name], MD  Patient coming from:   HOME  I have personally reviewed patient's medical records, in electronic medical records, including:  [place] link, and care everywhere.    Chief Complaint:   Chief Complaint  Patient presents with   Shortness of Breath    History of present illness:    Eric Scott is a 71 year old male with Pmh/o DM 2, morbid obesity, HTN, COPD, CHF, peripheral neuropathy, PVD, current osteomyelitis - open wound of left ankle -  on dual IV antibiotics was discharged yesterday to SNF.  Presenting once again in respiratory failure, shortness of breath, hypoxia. He was satting 83% on 4 L of nasal cannula, present on CPAP Apparently symptoms are lingering since yesterday, got worse this morning at the facility.  ED evaluation: Blood pressure (!) 146/91, pulse 78, temperature 97.9 Resp. rate 16, height 6' (1.829 m), weight 129.5 kg, SpO2 100 %.  On BiPAP Apparently patient was found hypoxic at the facility, biomass, in ED with remained on BiPAP saturation improved  Leukocytosis 13.1>>> 20.1 Lactic acid 1.2, 1.1, Creatinine 2.88  >>> 3.12, 3.19  Chest x-ray:New left upper lobe opacity is nonspecific but suspicious for Pneumonia.   Patient meets early sepsis criteria-for acute respiratory failure, leukocytosis, stable pneumonia, ?   Discussed the case with Dr. Sharol Given, and infectious disease Dr. Juleen China --we will reconsult on the patient Requested patient to be admitted to [facility]  =================================================  Patient Denies having: Fever, Chills, Cough,  Chest Pain, Abd pain, N/V/D, headache, dizziness, lightheadedness,  Dysuria,    Assessment / Plan:   Principal Problem:   Sepsis ([place]) Active Problems:   Acute on  chronic respiratory failure with hypoxia ([name])   Osteomyelitis of ankle or foot, acute, left ([name])   Stage 4 chronic kidney disease ([place])   Type 2 diabetes mellitus with stage 4 chronic kidney disease, with Certain-term current use of insulin ([name])   HTN (hypertension)   Diabetic polyneuropathy associated with type 2 diabetes mellitus ([name])   PVD (peripheral vascular disease) ([name])   Generalized weakness   Critical lower limb ischemia ([name])   Arm DVT (deep venous thromboembolism), acute, right ([name])   GERD (gastroesophageal reflux disease)   Obesity, Class III, BMI 40-49.9 (morbid obesity) ([name])   Atrial flutter, paroxysmal ([name])   Chronic heart failure with preserved ejection fraction (HFpEF) ([name])    Assessment & Plan:    Acute respiratory failure  -Multifactorial, possible pneumonia, underlying COPD  -Patient is baseline dependent on 4 L of oxygen -On arrival on 4 L it was satting 83% -Currently stable on BiPAP -Chest x-ray consistent with new onset pneumonia despite being on 2 antibiotics, worsening leukocytosis, but afebrile  -Continuing BiPAP-scheduling DuoNeb bronchodilators -Initiating IV steroids with anticipation of quick taper   Sepsis /pneumonia/osteomyelitis -Patient meets sepsis criteria due to acute on chronic respiratory failure, tachypnea, leukocytosis, With ongoing osteomyelitis infection, and presumed pneumonia  -Patient has been on dual IV antibiotics for  osteomyelitis Daptomycin and imipenem Discussed the case with ID Dr. Juleen China was reviewed the case.   Recommending continuing imipenem and adding Zyvox -Discontinuing daptomycin for now -We will follow-up with blood cultures -On obtaining CT of the chest  -Patient blood pressure stable, lactic acid normal therefore abstaining from IV fluid resuscitation per sepsis protocol for now  leg wound, left, osteomyelitis -Patient with hardware infection with osteo -He is s/p hardware removal -Culture is  growing Morganella, Acinetobacter, E faecalis, and MRSA -ID recommended 6 weeks of IV meropenem and daptomycin . -Line was placed 10/31 . -Wound VAC has been discontinued before discharge 11/6, patient to continue regular dressing changes   Dr. Sharol Given -Case was discussed he would like to reevaluate for possible further debridement     Acute on chronic respiratory failure with hypoxia ([place]) COPD exacerbation -Patient with COPD, on 3-4 L home O2,  -Currently respiratory failure, requiring BiPAP - he was treated with IV diuresis, adding IV steroids  - continue home dose torsemide,   COPD exacerbation -Continue as above    Atrial fibrillation, new diagnosis  - Cont amiodarone and metoprolol for heart rate control, he is    Anemia of chronic kidney disease -Hemoglobin stable at 10.4 -1 unit PRBC 11/4. -Panel significant for normal ferritin, but low saturation and iron level, would like to avoid IV iron in the setting of infection, s- on p.o. supplements, he would likely benefit from Aranesp in the future once iron storage is repleted .   Hypertension -continue  current regimenl    Acute on chronic diastolic CHF -Mild volume overload on imaging, and significant oxygen requirement. -Continue home medication of Demadex -PRN IV Lasix and as needed Zaroxolyn,    Chronic pain (back, legs) -He has chronic pain in addition to acute pain -Continue Norco, Neurontin or as per orthopedics   Stage 4 chronic kidney disease ([place]) -Appears to be stable at this time -Attempt to avoid nephrotoxic medications -Continue calcitriol -Continue colchicine for gout   Mood disorder ([place]) -Continue fluoxetine, alprazolam   Type 2 diabetes mellitus with stage 4 chronic kidney disease, with [place]-term current use of insulin ([name]) -A1c is 5.8, indicating good control -On glargine and insulin sliding scale  -Checking CBG QA CHS   Paroxysmal atrial fibrillation, history of right arm DVT/peripheral  vascular disease  -Continue home medication of Eliquis    Review of Systems: As per HPI, otherwise 10 point review of systems were negative.   ----------------------------------------------------------------------------------------------------------------------  Allergies  Allergen Reactions   Baclofen     unknown   Blueberry Flavor Other (See Comments)    Unknown   Cucumber Extract Other (See Comments)    "Fells like I'm having a heart attack"   Flexeril [Cyclobenzaprine Hcl] Other (See Comments)    "whole body  Tremors" (05/27/2012)   Kiwi Extract Other (See Comments)    "feels like I'm having a heart attack"    Home MEDs:  Prior to Admission medications   Medication Sig Start Date End Date Taking? Authorizing Provider  acetaminophen (TYLENOL) 325 MG tablet Take 2 tablets (650 mg total) by mouth every 6 (six) hours as needed for mild pain, fever or headache (or Fever >/= 101). 04/10/22   Elgergawy, Silver Huguenin, MD  albuterol (PROVENTIL) (2.5 MG/3ML) 0.083% nebulizer solution Take 3 mLs (2.5 mg total) by nebulization every 2 (two) hours as needed for wheezing or shortness of breath. 04/10/22   Elgergawy, Silver Huguenin, MD  ALPRAZolam Duanne Moron) 0.5 MG tablet Take 1 tablet (  0.5 mg total) by mouth at bedtime. 04/10/22   Elgergawy, Silver Huguenin, MD  Amino Acids-Protein Hydrolys (PRO-STAT AWC) LIQD Take 30 mLs by mouth daily.    [provider]  amiodarone (PACERONE) 200 MG tablet Continue amiodarone 200 mg BID or 8 days (16 more doses), then transition to amiodarone 200 mg daily. 04/10/22   Elgergawy, Silver Huguenin, MD  amLODipine (NORVASC) 10 MG tablet Take 1 tablet (10 mg total) by mouth daily. 05/21/19   Roxan Hockey, MD  apixaban (ELIQUIS) 5 MG TABS tablet Take 1 tablet (5 mg total) by mouth 2 (two) times daily. 04/10/22   Elgergawy, Silver Huguenin, MD  ascorbic acid (VITAMIN C) 500 MG tablet Take 500 mg by mouth 2 (two) times daily.    [provider]  BREO ELLIPTA 100-25 MCG/INH AEPB  Inhale 1 puff into the lungs daily.  09/27/16   [provider]  calcitRIOL (ROCALTROL) 0.25 MCG capsule Take 0.25 mcg by mouth daily.    [provider]  calcium carbonate (TUMS - DOSED IN MG ELEMENTAL CALCIUM) 500 MG chewable tablet Chew 2 tablets by mouth 2 (two) times daily.    [provider]  cholecalciferol (VITAMIN D) 1000 units tablet Take 1,000 Units by mouth daily.    [provider]  coconut oil OIL Apply 1 Application topically every 6 (six) hours as needed (protection and prevention).    [provider]  colchicine 0.6 MG tablet Take 0.5 tablets (0.3 mg total) by mouth daily. 04/10/22   Elgergawy, Silver Huguenin, MD  cyanocobalamin 1000 MCG tablet Take 1,000 mcg by mouth daily.    [provider]  DAPTOmycin (CUBICIN) 500 MG injection Inject 15 mLs (750 mg total) into the vein every other day for 28 days. Indication:  Polymicrobial L-ankle osteo First Dose: Yes Last Day of Therapy:  05/09/22 Labs - Once weekly:  CBC/D, BMP, and CPK Labs - Every other week:  ESR and CRP Rate: 130 ml/hr  Add 750 mg of Daptomycin to Normal Saline based on available bag volume. 04/11/22 05/09/22  GhimireHenreitta Leber, MD  ferrous sulfate 325 (65 FE) MG tablet Take 325 mg by mouth 2 (two) times daily.    [provider]  FLUoxetine (PROZAC) 40 MG capsule Take 40 mg by mouth daily.    [provider]  gabapentin (NEURONTIN) 300 MG capsule Take 1 capsule (300 mg total) by mouth at bedtime. 12/21/19   Barton Dubois, MD  guaiFENesin 200 MG tablet Take 200 mg by mouth 2 (two) times daily.    [provider]  HYDROcodone-acetaminophen (NORCO) 7.5-325 MG tablet Take 1 tablet by mouth 4 (four) times daily. 04/10/22   Elgergawy, Silver Huguenin, MD  insulin aspart (NOVOLOG) 100 UNIT/ML injection Inject 2-10 Units into the skin See admin instructions. Sliding scale if 200-250 = 2 units, 251-300 = 4 units, 301-350 = 6 units, 351-400 = 8 units, 401-500 units  = 10 units before meals and at betime    [provider]  ipratropium-albuterol (DUONEB) 0.5-2.5 (3) MG/3ML SOLN Take 3 mLs by nebulization every 4 (four) hours as needed (shortness of breath / wheezing).    [provider]  LANTUS 100 UNIT/ML injection Inject 0.22 mLs (22 Units total) into the skin at bedtime. 04/10/22   Elgergawy, Silver Huguenin, MD  magnesium oxide (MAG-OX) 400 MG tablet Take 400 mg by mouth daily.    [provider]  meropenem (MERREM) 1 g injection Inject 1 g into the vein every  12 (twelve) hours for 28 days. Indication:  Polymicrobial L-ankle osteo First Dose: Yes Last Day of Therapy:  05/09/22 Labs - Once weekly:  CBC/D and BMP, Labs - Every other week:  ESR and CRP Rate: 200 mL/hour 04/11/22 05/09/22  Jonetta Osgood, MD  metolazone (ZAROXOLYN) 2.5 MG tablet Take 1 tablet (2.5 mg total) by mouth every Monday, Wednesday, and Friday. 04/11/22   Elgergawy, Silver Huguenin, MD  metoprolol tartrate (LOPRESSOR) 25 MG tablet Take 1 tablet (25 mg total) by mouth 2 (two) times daily. 04/10/22   Elgergawy, Silver Huguenin, MD  Nutritional Supplement LIQD Take 120 mLs by mouth daily. "House supplement"    [provider]  pantoprazole (PROTONIX) 40 MG tablet Take 1 tablet (40 mg total) by mouth daily before breakfast. 05/21/19 03/27/22  Emokpae, Courage, MD  polyethylene glycol (MIRALAX / GLYCOLAX) 17 g packet Take 17 g by mouth daily. For constipation    [provider]  potassium bicarbonate (K-LYTE) 25 MEQ disintegrating tablet Take 25 mEq by mouth daily.    [provider]  senna (SENOKOT) 8.6 MG tablet Take 2 tablets by mouth 2 (two) times daily.    [provider]  tamsulosin (FLOMAX) 0.4 MG CAPS capsule Take 1 capsule (0.4 mg total) by mouth daily after supper. Patient taking differently: Take 0.4 mg by mouth daily. 11/18/19   Roxan Hockey, MD  torsemide (DEMADEX) 20 MG tablet Take 4 tablets (80 mg total) by mouth 2 (two) times daily.  04/10/22   Elgergawy, Silver Huguenin, MD    PRN MEDs: sodium chloride, acetaminophen **OR** acetaminophen, bisacodyl, hydrALAZINE, HYDROmorphone (DILAUDID) injection, ipratropium, ipratropium-albuterol, levalbuterol, ondansetron **OR** ondansetron (ZOFRAN) IV, oxyCODONE, senna-docusate, sodium chloride flush, sodium phosphate, traZODone  Past Medical History:  Diagnosis Date   Anemia    Anxiety    Chronic pain    legs, back; MRI 05/2012 with mild thoracic degenerative changes no spinal stenosis    CKD (chronic kidney disease) 05/12/2019   Stage IV   COPD (chronic obstructive pulmonary disease) ([place])    COVID-19    Depression    Diabetic peripheral neuropathy ([place])    "chronic" (93/73/4287)   Diastolic CHF ([name])    Duodenal ulcer hemorrhage    DVT (deep venous thrombosis) ([name])    right upper arm   GERD (gastroesophageal reflux disease)    Hypercholesteremia    Hypertension    Iron deficiency anemia    Osteomyelitis of ankle ([name])    Peripheral edema    PVD (peripheral vascular disease) ([place])    Spinal stenosis    mild lumbar (MRI 05/2012)-L2-L3 to L4-L5 , mild lumbar foraminal stenosis    Stroke ([place]) 05/2012   Subacute, lacunar infarcts within the left basal ganglia and posterior limp of the left internal capsule/thalamus; "RUE; both feet weak" (05/27/2012)   Tremor    Type II diabetes mellitus ([place])     Past Surgical History:  Procedure Laterality Date   ANKLE SURGERY     APPLICATION OF WOUND VAC Left 03/28/2022   Procedure: APPLICATION OF WOUND VAC;  Surgeon: [name], MD;  Location: [place];  Service: Orthopedics;  Laterality: Left;   ESOPHAGOGASTRODUODENOSCOPY (EGD) WITH PROPOFOL N/A 12/21/2016   [place]: Ulcerative reflux esophagitis, erosive gastropathy, extensive duodenal ulceration likely site of bleeding.  Pathology with mild gastritis, no H. pylori   ESOPHAGOGASTRODUODENOSCOPY (EGD) WITH PROPOFOL N/A 04/02/2017   Fields: ESOPAHGITIS/GASTRIC AND DUODENAL ULCERS  HEALED. mild duodenitis   HARDWARE REMOVAL Left 03/28/2022   Procedure: LEFT  ANKLE REMOVAL DEEP HARDWARE, REMOVE BONE AND APPLY TISSUE GRAFT;  Surgeon: [name], MD;  Location: [place];  Service: Orthopedics;  Laterality: Left;   HERNIA REPAIR  01/05/2004   "belly button" (05/27/2012)   IR FLUORO GUIDE CV LINE RIGHT  04/03/2022   IR US GUIDE VASC ACCESS RIGHT  04/03/2022     reports that he quit smoking about 2 years ago. His smoking use included cigarettes. He has a 108.00 pack-year smoking history. He has never used smokeless tobacco. He reports that he does not drink alcohol and does not use drugs.   Family History  Problem Relation Age of Onset   Colon cancer Neg Hx     Physical Exam:   Vitals:   04/12/22 0600 04/12/22 0630 04/12/22 0700 04/12/22 0730  BP: 115/69 139/63 130/65 (!) 146/91  Pulse: 73 90 73 78  Resp: (!) _0 Temp:      TempSrc:      SpO2: 100% 100% 99% 100%  Weight:      Height:       Constitutional: NAD, calm, comfortable Eyes: PERRL, lids and conjunctivae normal ENMT: Mucous membranes are moist. Posterior pharynx clear of any exudate or lesions.Normal dentition.  Neck: normal, supple, no masses, no thyromegaly Respiratory: clear to auscultation bilaterally, no wheezing, no crackles. Normal respiratory effort. No accessory muscle use.  Cardiovascular: Regular rate and rhythm, no murmurs / rubs / gallops. No extremity edema. 2+ pedal pulses. No carotid bruits.  Abdomen: no tenderness, no masses palpated. No hepatosplenomegaly. Bowel sounds positive.  Musculoskeletal: Open left ankle wound no clubbing / cyanosis. No joint deformity upper and lower extremities. Good ROM, no contractures. Normal muscle tone.  Neurologic: CN II-XII grossly intact. Sensation intact, DTR normal. Strength 5/5 in all 4.  Psychiatric: Normal judgment and insight. Alert and oriented x 3. Normal mood.  Skin: no rashes, lesions, ulcers. No induration Decubitus/ulcers:  Multiple ulcers in different stages, open left ankle wound Wounds: per nursing documentation         Labs on admission:    I have personally reviewed following labs and imaging studies  CBC: Recent Labs  Lab 04/08/22 0418 04/09/22 0155 04/10/22 0445 04/11/22 0545 04/12/22 0531  WBC 7.2 9.6 12.1* 13.1* 20.1*  NEUTROABS  --   --   --   --  15.2*  HGB 8.1* 8.4* 9.0* 9.5* 10.4*  HCT 24.9* 25.2* 27.3* 29.5* 32.2*  MCV 94.0 93.3 92.9 93.1 93.9  PLT 249 244 262 264 197   Basic Metabolic Panel: Recent Labs  Lab 04/08/22 0418 04/09/22 0155 04/10/22 0445 04/11/22 0545 04/12/22 0531  NA 137 138 140 137 139  K 4.9 5.0 4.3 5.6* 4.3  CL 102 102 106 98 100  CO2 _1 GLUCOSE 286* 293* 225* 333* 206*  BUN 74* 81* 77* 90* 92*  CREATININE 2.99* 3.03* 2.88* 3.12* 3.19*  CALCIUM 8.4* 8.6* 7.8* 8.6* 8.4*  MG  --  1.9  --   --   --    GFR: Estimated Creatinine Clearance: 29.6 mL/min (A) (by C-G formula based on SCr of 3.19 mg/dL (H)). Liver Function Tests: Recent Labs  Lab 04/12/22 0531  AST 12*  ALT 12  ALKPHOS 62  BILITOT 0.7  PROT 6.7  ALBUMIN 2.9*   No results for input(s): "LIPASE", "AMYLASE" in the last 168 hours. No results for input(s): "AMMONIA" in the last 168 hours. Coagulation Profile: No results for input(s): "INR", "PROTIME"  in the last 168 hours. Cardiac Enzymes: Recent Labs  Lab 04/06/22 0636  CKTOTAL 26*   BNP (last 3 results) No results for input(s): "PROBNP" in the last 8760 hours. HbA1C: No results for input(s): "HGBA1C" in the last 72 hours. CBG: Recent Labs  Lab 04/10/22 1126 04/10/22 1629 04/10/22 2058 04/11/22 0601 04/11/22 1109  GLUCAP 311* 335* 304* 315* 237*   Lipid Profile: No results for input(s): "CHOL", "HDL", "LDLCALC", "TRIG", "CHOLHDL", "LDLDIRECT" in the last 72 hours. Thyroid Function Tests: No results for input(s): "TSH", "T4TOTAL", "FREET4", "T3FREE", "THYROIDAB" in the last 72 hours. Anemia Panel: No  results for input(s): "VITAMINB12", "FOLATE", "FERRITIN", "TIBC", "IRON", "RETICCTPCT" in the last 72 hours. Urine analysis:    Component Value Date/Time   COLORURINE YELLOW 05/11/2020 2100   APPEARANCEUR HAZY (A) 05/11/2020 2100   LABSPEC 1.012 05/11/2020 2100   PHURINE 5.0 05/11/2020 2100   GLUCOSEU NEGATIVE 05/11/2020 2100   HGBUR NEGATIVE 05/11/2020 2100   BILIRUBINUR NEGATIVE 05/11/2020 2100   [place] NEGATIVE 05/11/2020 2100   PROTEINUR 30 (A) 05/11/2020 2100   UROBILINOGEN 0.2 03/04/2015 1835   NITRITE NEGATIVE 05/11/2020 2100   LEUKOCYTESUR MODERATE (A) 05/11/2020 2100    Last A1C:  Lab Results  Component Value Date   HGBA1C 5.8 (H) 03/28/2022     Radiologic Exams on Admission:   DG Chest Port 1 View  Result Date: 04/12/2022 CLINICAL DATA:  71 year old male with shortness of breath and chest pain. EXAM: PORTABLE CHEST 1 VIEW COMPARISON:  Portable chest 04/05/2022 and earlier. FINDINGS: Portable AP semi upright view at 0537 hours. Stable right IJ approach multi lumen central line. Lung volumes and mediastinal contours have not significantly changed. But there is confluent new left upper lobe pulmonary opacity, probably with a degree of consolidation. No superimposed pneumothorax or pleural effusion. Underlying coarse bilateral pulmonary interstitial opacity is stable and might be chronic. No other confluent lung opacity. Visualized tracheal air column is within normal limits. Stable visualized osseous structures. IMPRESSION: 1. New left upper lobe opacity is nonspecific but suspicious for Pneumonia. 2. Otherwise stable chest. Nonspecific pulmonary interstitial opacity which might be due to chronic lung disease. Electronically Signed   By: Genevie Ann M.D.   On: 04/12/2022 05:59    EKG:   Independently reviewed.  Orders placed or performed during the hospital encounter of 04/12/22   ED EKG   ED EKG   EKG 12-Lead   EKG 12-Lead   EKG 12-Lead   -----     Consults  called: Disease Dr. Juleen China, orthopedic Dr. Beverely Low due to -------------------------------------------------------------------------------------------------------------------------------------------- DVT prophylaxis:  heparin injection 5,000 Units Start: 04/12/22 0830 TED hose Start: 04/12/22 0815 SCDs Start: 04/12/22 0815 apixaban (ELIQUIS) tablet 5 mg   Code Status:   Code Status: Full Code   Admission status: Patient will be admitted as Inpatient, with a greater than 2 midnight length of stay. Level of care: Progressive   Family Communication:  none at bedside  (The above findings and plan of care has been discussed with patient in detail, the patient expressed understanding and agreement of above plan)  --------------------------------------------------------------------------------------------------------------------------------------------------  Disposition Plan:  Anticipated 1-2 days Status is: Inpatient Remains inpatient appropriate because: Needing for acute respiratory failure, IV antibiotics, evaluation by orthopedic for acute on chronic osteomyelitis open left ankle wound.     ----------------------------------------------------------------------------------------------------------------------------------------------------  Time spent: > than  75  Min.   SIGNED: Deatra James, MD, FHM. [facility],  Pager (Please use [email or web address] to page to text)  If  7PM-7AM, please contact night-coverage [email or web address],  04/12/2022, 9:02 AM

## 2022-04-12 NOTE — ED Notes (Signed)
Cancer center charge nurse came to evaluate pt's CVC and was unable to flush or instill cathflow solution to regain line patency. ED Charge nurse aware.

## 2022-04-12 NOTE — ED Notes (Signed)
Deferred swallow screen at this time as pt is currently on bipap. He is awake and alert and able to follow commands without difficulty. No obvious signs of neuro deficit noted.

## 2022-04-13 ENCOUNTER — Other Ambulatory Visit: Payer: Self-pay

## 2022-04-13 DIAGNOSIS — M86172 Other acute osteomyelitis, left ankle and foot: Secondary | ICD-10-CM

## 2022-04-13 DIAGNOSIS — R6520 Severe sepsis without septic shock: Secondary | ICD-10-CM | POA: Diagnosis not present

## 2022-04-13 DIAGNOSIS — N184 Chronic kidney disease, stage 4 (severe): Secondary | ICD-10-CM

## 2022-04-13 DIAGNOSIS — I4892 Unspecified atrial flutter: Secondary | ICD-10-CM

## 2022-04-13 DIAGNOSIS — J9600 Acute respiratory failure, unspecified whether with hypoxia or hypercapnia: Secondary | ICD-10-CM

## 2022-04-13 DIAGNOSIS — A419 Sepsis, unspecified organism: Secondary | ICD-10-CM | POA: Diagnosis not present

## 2022-04-13 LAB — BASIC METABOLIC PANEL
Anion gap: 13 (ref 5–15)
BUN: 99 mg/dL — ABNORMAL HIGH (ref 8–23)
CO2: 27 mmol/L (ref 22–32)
Calcium: 8.5 mg/dL — ABNORMAL LOW (ref 8.9–10.3)
Chloride: 96 mmol/L — ABNORMAL LOW (ref 98–111)
Creatinine, Ser: 3.33 mg/dL — ABNORMAL HIGH (ref 0.61–1.24)
GFR, Estimated: 19 mL/min — ABNORMAL LOW (ref >60)
Glucose, Bld: 256 mg/dL — ABNORMAL HIGH (ref 70–99)
Potassium: 4.6 mmol/L (ref 3.5–5.1)
Sodium: 136 mmol/L (ref 135–145)

## 2022-04-13 LAB — CBC
HCT: 28.2 % — ABNORMAL LOW (ref 39.0–52.0)
Hemoglobin: 9.2 g/dL — ABNORMAL LOW (ref 13.0–17.0)
MCH: 30.1 pg (ref 26.0–34.0)
MCHC: 32.6 g/dL (ref 30.0–36.0)
MCV: 92.2 fL (ref 80.0–100.0)
Platelets: 270 10*3/uL (ref 150–400)
RBC: 3.06 MIL/uL — ABNORMAL LOW (ref 4.22–5.81)
RDW: 14.3 % (ref 11.5–15.5)
WBC: 11.2 10*3/uL — ABNORMAL HIGH (ref 4.0–10.5)
nRBC: 0 % (ref 0.0–0.2)

## 2022-04-13 LAB — PROTIME-INR
INR: 1.4 — ABNORMAL HIGH (ref 0.8–1.2)
Prothrombin Time: 16.7 seconds — ABNORMAL HIGH (ref 11.4–15.2)

## 2022-04-13 LAB — APTT: aPTT: 37 seconds — ABNORMAL HIGH (ref 24–36)

## 2022-04-13 LAB — GLUCOSE, CAPILLARY
Glucose-Capillary: 173 mg/dL — ABNORMAL HIGH (ref 70–99)
Glucose-Capillary: 168 mg/dL — ABNORMAL HIGH (ref 70–99)
Glucose-Capillary: 385 mg/dL — ABNORMAL HIGH (ref 70–99)
Glucose-Capillary: 383 mg/dL — ABNORMAL HIGH (ref 70–99)
Glucose-Capillary: 205 mg/dL — ABNORMAL HIGH (ref 70–99)

## 2022-04-13 MED ORDER — LINEZOLID 600 MG PO TABS
600.0000 mg | ORAL_TABLET | Freq: Two times a day (BID) | ORAL | Status: DC
  Administered 2022-04-13 – 2022-04-17 (×7): 600 mg via ORAL
  Filled 2022-04-13 (×10): qty 1

## 2022-04-13 MED ORDER — BUDESONIDE 0.25 MG/2ML IN SUSP
0.2500 mg | Freq: Two times a day (BID) | RESPIRATORY_TRACT | Status: DC
  Administered 2022-04-13 – 2022-04-20 (×15): 0.25 mg via RESPIRATORY_TRACT
  Filled 2022-04-13 (×15): qty 2

## 2022-04-13 MED ORDER — PREDNISONE 20 MG PO TABS
40.0000 mg | ORAL_TABLET | Freq: Every day | ORAL | Status: DC
  Administered 2022-04-14 – 2022-04-15 (×2): 40 mg via ORAL
  Filled 2022-04-13 (×2): qty 2

## 2022-04-13 MED ORDER — CHLORHEXIDINE GLUCONATE CLOTH 2 % EX PADS
6.0000 | MEDICATED_PAD | Freq: Every day | CUTANEOUS | Status: DC
  Administered 2022-04-13 – 2022-04-20 (×8): 6 via TOPICAL

## 2022-04-13 MED ORDER — REVEFENACIN 175 MCG/3ML IN SOLN
175.0000 ug | Freq: Every day | RESPIRATORY_TRACT | Status: DC
  Administered 2022-04-13 – 2022-04-20 (×8): 175 ug via RESPIRATORY_TRACT
  Filled 2022-04-13 (×8): qty 3

## 2022-04-13 MED ORDER — ALBUTEROL SULFATE (2.5 MG/3ML) 0.083% IN NEBU: 2.5000 mg | INHALATION_SOLUTION | RESPIRATORY_TRACT | Status: DC | PRN

## 2022-04-13 MED ORDER — IPRATROPIUM BROMIDE 0.02 % IN SOLN
0.5000 mg | Freq: Three times a day (TID) | RESPIRATORY_TRACT | Status: DC
  Administered 2022-04-13 – 2022-04-14 (×3): 0.5 mg via RESPIRATORY_TRACT
  Filled 2022-04-13 (×3): qty 2.5

## 2022-04-13 MED ORDER — ARFORMOTEROL TARTRATE 15 MCG/2ML IN NEBU
15.0000 ug | INHALATION_SOLUTION | Freq: Two times a day (BID) | RESPIRATORY_TRACT | Status: DC
  Administered 2022-04-13 – 2022-04-20 (×15): 15 ug via RESPIRATORY_TRACT
  Filled 2022-04-13 (×15): qty 2

## 2022-04-13 MED ORDER — LEVALBUTEROL HCL 0.63 MG/3ML IN NEBU
0.6300 mg | INHALATION_SOLUTION | Freq: Three times a day (TID) | RESPIRATORY_TRACT | Status: DC
  Filled 2022-04-13 (×4): qty 3

## 2022-04-13 NOTE — Consult Note (Addendum)
[name] for Infectious Disease    Date of Admission:  04/12/2022   Total days of inpatient antibiotics 10/25-        Reason for Consult: PNA    Principal Problem:   Sepsis ([place]) Active Problems:   Type 2 diabetes mellitus with stage 4 chronic kidney disease, with [place]-term current use of insulin ([name])   HTN (hypertension)   Diabetic polyneuropathy associated with type 2 diabetes mellitus ([name])   PVD (peripheral vascular disease) ([name])   Generalized weakness   Critical lower limb ischemia ([name])   Arm DVT (deep venous thromboembolism), acute, right ([name])   GERD (gastroesophageal reflux disease)   Obesity, Class III, BMI 40-49.9 (morbid obesity) ([name])   Atrial flutter, paroxysmal ([name])   Chronic heart failure with preserved ejection fraction (HFpEF) ([name])   Acute on chronic respiratory failure with hypoxia ([name])   Osteomyelitis of ankle or foot, acute, left ([name])   Stage 4 chronic kidney disease ([place])   Assessment: 71 YM admitted with: #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  #COPD -Discharged to SNF on 11/8 following left leg HWR removal with polymicrobial Cx as above. Found to be hypoxic at SNF.  -CT showed left upper lobe large airspace opacity concerning for pneumonia, multiple small patches metastatic disease noted in left lower lobe right upper lobe concerning for multifocal pneumonia.   - Patient has been on daptomycin 10/27 which puts him right at the 2-week mark(usually presents 2-4 weeks of daptomycin).  I think daptomycin is in the differential as a cause of pneumonia with imaging showing b/l patchy opacity although there seems to be some consolidative changes in the left lobe noted as LUL airpace opacity.  Factors that favor dapto induced  eosinophilic pneumonia are peripheral eosinophilia at  10%(2.1K), CT changes, initial respiratory distress.  I think he deserves a bronch in order to look for other etiologies as well.  In the meantime we will stop daptomycin and stop Recommendations: - DC daptomycin - Start linezolid - Continue meropenem - End date for 6 weeks of antibiotics as 12/6.  If this is a sign of dapto induced pneumonia would not favor doing linezolid for about a month.  We could do oritavancin on discharge followed by 2-week course linezolid, to complete the antibiotic course. - Engage pulmonology for possible bronc please obtain cell count, AFB, fungal, bacterial cultures, cytology.  Dr. Baxter Flattery will be on during the weekend Microbiology:   Antibiotics:  Daptomycin 10/27-11/8 Linezolid 11-9- Merrem 10/27-    HPI: Eric Scott is a 71 y.o. male with PMHx of DM, morbid obesity, HTN, COPD, CHF, peripheral neuropathy, PVD, recent admission 10/25 for osteomyelitis SP bone debridement and removal on infected hardware w/ OR Cx+ Acinetobacter, Morganella, VRE, and MRSA discharged on dapto+ merrem via tunneled central line x6 weeks EOT 05/09/22. HE was discharge to SNF on 11/8 presented with repeiratroy failure, SHOB and hyposcia and [place]. He wbc 20k (2k eos), afebrile. CT showed left upper lobe large airspace opacity concerning for pneumonia, multiple small patches metastatic disease noted in left lower lobe right upper lobe concerning for multifocal pneumonia.  7 mm nodule in left lower lobe noted 6 to 71-monthfollow-up, 4.3 cm 1.6 cm left thyroid nodule noted patient was switched from daptomycin to linezolid due to concern of possible daptomycin induced  eosinophilic PNA   Review of Systems: Review of Systems  All other systems reviewed and are negative.   Past Medical History:  Diagnosis Date   Anemia    Anxiety    Chronic pain    legs, back; MRI 05/2012 with mild thoracic degenerative changes no spinal stenosis    CKD (chronic kidney disease) 05/12/2019    Stage IV   COPD (chronic obstructive pulmonary disease) ([place])    COVID-19    Depression    Diabetic peripheral neuropathy ([name])    "chronic" (10/62/6948)   Diastolic CHF ([name])    Duodenal ulcer hemorrhage    DVT (deep venous thrombosis) ([name])    right upper arm   GERD (gastroesophageal reflux disease)    Hypercholesteremia    Hypertension    Iron deficiency anemia    Osteomyelitis of ankle ([name])    Peripheral edema    PVD (peripheral vascular disease) ([name])    Spinal stenosis    mild lumbar (MRI 05/2012)-L2-L3 to L4-L5 , mild lumbar foraminal stenosis    Stroke ([place]) 05/2012   Subacute, lacunar infarcts within the left basal ganglia and posterior limp of the left internal capsule/thalamus; "RUE; both feet weak" (05/27/2012)   Tremor    Type II diabetes mellitus ([name])     Social History   Tobacco Use   Smoking status: Former    Packs/day: 2.00    Years: 54.00    Total pack years: 108.00    Types: Cigarettes    Quit date: 12/12/2019    Years since quitting: 2.3   Smokeless tobacco: Never  Vaping Use   Vaping Use: Never used  Substance Use Topics   Alcohol use: No    Comment: 05/27/2012 "drank gallons and gallons 20 yr ago or so; last drink  at least 10 yr ago"   Drug use: No    Family History  Problem Relation Age of Onset   Colon cancer Neg Hx    Scheduled Meds:  ALPRAZolam  0.5 mg Oral QHS   amiodarone  200 mg Oral BID   [START ON 04/19/2022] amiodarone  200 mg Oral Daily   amLODipine  10 mg Oral Daily   apixaban  5 mg Oral BID   arformoterol  15 mcg Nebulization BID   budesonide (PULMICORT) nebulizer solution  0.25 mg Nebulization BID   Chlorhexidine Gluconate Cloth  6 each Topical Daily   colchicine  0.3 mg Oral Daily   ferrous sulfate  325 mg Oral BID   FLUoxetine  40 mg Oral Daily   gabapentin  300 mg Oral QHS   HYDROcodone-acetaminophen  1 tablet Oral QID   insulin aspart  0-20 Units Subcutaneous TID AC & HS   insulin glargine-yfgn  20 Units  Subcutaneous Daily   levalbuterol  0.63 mg Nebulization Q8H   And   ipratropium  0.5 mg Nebulization Q8H   magnesium oxide  400 mg Oral Daily   metoprolol tartrate  25 mg Oral BID   polyethylene glycol  17 g Oral Daily   [START ON 04/14/2022] predniSONE  40 mg Oral Q breakfast   revefenacin  175 mcg Nebulization Daily   senna  2 tablet Oral BID   sodium chloride flush  3 mL Intravenous Q12H   sodium chloride flush  3 mL Intravenous Q12H   tamsulosin  0.4 mg Oral Q supper   Continuous Infusions:  sodium chloride     linezolid (ZYVOX) IV 600 mg (04/12/22 2200)   meropenem (MERREM) IV  1 g (04/13/22 1041)   PRN Meds:.sodium chloride, acetaminophen **OR** acetaminophen, albuterol, bisacodyl, hydrALAZINE, HYDROmorphone (DILAUDID) injection, ondansetron **OR** ondansetron (ZOFRAN) IV, oxyCODONE, senna-docusate, sodium chloride flush, sodium phosphate, traZODone Allergies  Allergen Reactions   Baclofen     unknown   Blueberry Flavor Other (See Comments)    Unknown   Cucumber Extract Other (See Comments)    "Fells like I'm having a heart attack"   Flexeril [Cyclobenzaprine Hcl] Other (See Comments)    "whole body  Tremors" (05/27/2012)   Kiwi Extract Other (See Comments)    "feels like I'm having a heart attack"    OBJECTIVE: Blood pressure (!) 111/99, pulse 71, temperature 97.8 F (36.6 C), temperature source Oral, resp. rate (!) 25, height 6' (1.829 m), weight 129.5 kg, SpO2 95 %.  Physical Exam Constitutional:      General: He is not in acute distress.    Appearance: He is normal weight. He is not toxic-appearing.  HENT:     Head: Normocephalic and atraumatic.     Right Ear: External ear normal.     Left Ear: External ear normal.     Nose: No congestion or rhinorrhea.     Mouth/Throat:     Mouth: Mucous membranes are moist.     Pharynx: Oropharynx is clear.  Eyes:     Extraocular Movements: Extraocular movements intact.     Conjunctiva/sclera: Conjunctivae normal.      Pupils: Pupils are equal, round, and reactive to light.  Cardiovascular:     Rate and Rhythm: Normal rate and regular rhythm.     Heart sounds: No murmur heard.    No friction rub. No gallop.  Pulmonary:     Effort: Pulmonary effort is normal.     Comments: Coarse breath sounds Abdominal:     General: Abdomen is flat. Bowel sounds are normal.     Palpations: Abdomen is soft.  Musculoskeletal:        General: No swelling. Normal range of motion.     Cervical back: Normal range of motion and neck supple.  Skin:    General: Skin is warm and dry.  Neurological:     General: No focal deficit present.     Mental Status: He is oriented to person, place, and time.  Psychiatric:        Mood and Affect: Mood normal.     Lab Results Lab Results  Component Value Date   WBC 11.2 (H) 04/13/2022   HGB 9.2 (L) 04/13/2022   HCT 28.2 (L) 04/13/2022   MCV 92.2 04/13/2022   PLT 270 04/13/2022    Lab Results  Component Value Date   CREATININE 3.33 (H) 04/13/2022   BUN 99 (H) 04/13/2022   NA 136 04/13/2022   K 4.6 04/13/2022   CL 96 (L) 04/13/2022   CO2 27 04/13/2022    Lab Results  Component Value Date   ALT 12 04/12/2022   AST 12 (L) 04/12/2022   ALKPHOS 62 04/12/2022   BILITOT 0.7 04/12/2022       Laurice Record, [facility] 04/13/2022, 1:23 PM

## 2022-04-13 NOTE — Evaluation (Signed)
Clinical/Bedside Swallow Evaluation Patient Details  Name: Eric Scott MRN: [id number] Date of Birth: 08/21/1950  Today's Date: 04/13/2022 Time: SLP Start Time (ACUTE ONLY): 64 SLP Stop Time (ACUTE ONLY): 1203 SLP Time Calculation (min) (ACUTE ONLY): 10 min  Past Medical History:  Past Medical History:  Diagnosis Date   Anemia    Anxiety    Chronic pain    legs, back; MRI 05/2012 with mild thoracic degenerative changes no spinal stenosis    CKD (chronic kidney disease) 05/12/2019   Stage IV   COPD (chronic obstructive pulmonary disease) ([place])    COVID-19    Depression    Diabetic peripheral neuropathy ([place])    "chronic" (66/29/4765)   Diastolic CHF ([name])    Duodenal ulcer hemorrhage    DVT (deep venous thrombosis) ([name])    right upper arm   GERD (gastroesophageal reflux disease)    Hypercholesteremia    Hypertension    Iron deficiency anemia    Osteomyelitis of ankle ([name])    Peripheral edema    PVD (peripheral vascular disease) ([place])    Spinal stenosis    mild lumbar (MRI 05/2012)-L2-L3 to L4-L5 , mild lumbar foraminal stenosis    Stroke ([place]) 05/2012   Subacute, lacunar infarcts within the left basal ganglia and posterior limp of the left internal capsule/thalamus; "RUE; both feet weak" (05/27/2012)   Tremor    Type II diabetes mellitus ([place])    Past Surgical History:  Past Surgical History:  Procedure Laterality Date   ANKLE SURGERY     APPLICATION OF WOUND VAC Left 03/28/2022   Procedure: APPLICATION OF WOUND VAC;  Surgeon: [name], MD;  Location: [facility];  Service: Orthopedics;  Laterality: Left;   ESOPHAGOGASTRODUODENOSCOPY (EGD) WITH PROPOFOL N/A 12/21/2016   Rourk: Ulcerative reflux esophagitis, erosive gastropathy, extensive duodenal ulceration likely site of bleeding.  Pathology with mild gastritis, no H. pylori   ESOPHAGOGASTRODUODENOSCOPY (EGD) WITH PROPOFOL N/A 04/02/2017   Fields: ESOPAHGITIS/GASTRIC AND DUODENAL ULCERS HEALED. mild duodenitis    HARDWARE REMOVAL Left 03/28/2022   Procedure: LEFT ANKLE REMOVAL DEEP HARDWARE, REMOVE BONE AND APPLY TISSUE GRAFT;  Surgeon: [name], MD;  Location: [facility];  Service: Orthopedics;  Laterality: Left;   HERNIA REPAIR  01/05/2004   "belly button" (05/27/2012)   IR FLUORO GUIDE CV LINE RIGHT  04/03/2022   IR US GUIDE VASC ACCESS RIGHT  04/03/2022   HPI:  Patient is a 71 y.o.  male with history of COPD on 3-4 L of oxygen at home, HFpEF, DM-2, CKD stage IV-who was recently hospitalized (10/25-11/8) for left ankle osteomyelitis-s/p hardware removal and debridement (polymicrobial infection with Acinetobacter/Morganella/Enterococcus faecalis/Staph aureus)-hospital course was complicated by worsening respiratory failure requiring BiPAP in the setting of volume overload/COPD exacerbation-patient was stabilized-and subsequently discharged to SNF on 11/8,. Pt returned to [facility] on 11/9 with worsening hypoxemia-and was found to have new left upper lobe PNA.    Assessment / Plan / Recommendation  Clinical Impression  Pt presented with functional oropharyngeal swallow in setting of current pna/respiratory status. Oromotor abilities were intact and missing posterior dentition (mandibular and maxillary). He exhibited one delayed cough at end of session that did not appear related to a specific consistency. Slightly increased work of breathing likely due to current respiratory status with SpO2 remaining 93% throughout. Pt educated to take breaks during meal, small/single sips and upright positioning. Continue regular texture, thin liquids, pills whole in puree if needed. No further services needed at this time. SLP Visit  Diagnosis: Dysphagia, unspecified (R13.10)    Aspiration Risk  Mild aspiration risk    Diet Recommendation Regular;Thin liquid   Liquid Administration via: Straw;Cup Medication Administration: Whole meds with puree Supervision: Patient able to self feed Compensations: Slow rate;Small  sips/bites (rest breaks when SOB) Postural Changes: Seated upright at 90 degrees    Other  Recommendations Oral Care Recommendations: Oral care BID    Recommendations for follow up therapy are one component of a multi-disciplinary discharge planning process, led by the attending physician.  Recommendations may be updated based on patient status, additional functional criteria and insurance authorization.  Follow up Recommendations No SLP follow up      Assistance Recommended at Discharge    Functional Status Assessment    Frequency and Duration            Prognosis        Swallow Study   General Date of Onset: 04/12/22 HPI: Patient is a 71 y.o.  male with history of COPD on 3-4 L of oxygen at home, HFpEF, DM-2, CKD stage IV-who was recently hospitalized (10/25-11/8) for left ankle osteomyelitis-s/p hardware removal and debridement (polymicrobial infection with Acinetobacter/Morganella/Enterococcus faecalis/Staph aureus)-hospital course was complicated by worsening respiratory failure requiring BiPAP in the setting of volume overload/COPD exacerbation-patient was stabilized-and subsequently discharged to SNF on 11/8,. Pt returned to [facility] ED on 11/9 with worsening hypoxemia-and was found to have new left upper lobe PNA. Type of Study: Bedside Swallow Evaluation Previous Swallow Assessment: none Diet Prior to this Study: Regular;Thin liquids Temperature Spikes Noted: No Respiratory Status: Nasal cannula History of Recent Intubation: No Behavior/Cognition: Alert;Cooperative;Pleasant mood Oral Cavity Assessment: Within Functional Limits Oral Care Completed by SLP: No Oral Cavity - Dentition: Missing dentition (missing posterior) Vision: Functional for self-feeding Self-Feeding Abilities: Needs assist Patient Positioning: Upright in bed Baseline Vocal Quality: Normal Volitional Cough: Strong Volitional Swallow: Able to elicit    Oral/Motor/Sensory Function Overall Oral Motor/Sensory  Function: Within functional limits   Ice Chips Ice chips: Not tested   Thin Liquid Thin Liquid: Within functional limits Presentation: Straw    Nectar Thick Nectar Thick Liquid: Not tested   Honey Thick Honey Thick Liquid: Not tested   Puree Puree: Not tested   Solid     Solid: Within functional limits      Houston Siren 04/13/2022,2:01 PM  Orbie Pyo Colvin Caroli.Ed Product/process development scientist [phone]

## 2022-04-13 NOTE — Plan of Care (Signed)

## 2022-04-13 NOTE — Inpatient Diabetes Management (Signed)
Inpatient Diabetes Program Recommendations  AACE/ADA: New Consensus Statement on Inpatient Glycemic Control (2015)  Target Ranges:  Prepandial:   less than 140 mg/dL      Peak postprandial:   less than 180 mg/dL (1-2 hours)      Critically ill patients:  140 - 180 mg/dL    Latest Reference Range & Units 04/13/22 07:55 04/13/22 12:20  Glucose-Capillary 70 - 99 mg/dL 168 (H)  4 units Novolog  385 (H)    20 units Semglee '@1036'$   (H): Data is abnormally high    Here from 10/25 thru 11/08 with Polymicrobial left ankle osteomyelitis-s/p debridement/hardware removal by Dr. Sharol Given on 10/25--Back to ED from [facility] 11/09 with Acute on chronic hypoxic respiratory failure/ Left upper lobe PNA/COPD exacerbation/ Severe sepsis   Home DM  Meds: Novolog 2-10 units ac/hs per SSI      Lantus 22 units QHS    Current Orders: Semglee 20 units daily    Novolog 0-20 units TID ac/hs    Semglee started yest AM  Of note, pt got Solumedrol 125 mg X 1 dose yest AM--Changed to Prednisone 40 mg daily   MD- Please consider adding Novolog Meal Coverage while pt getting Prednisone  Novolog 4 units TID with meals to start HOLD if pt NPO HOLD if pt eats <50% meals    --Will follow patient during hospitalization--  Wyn Quaker RN, MSN, [place] Diabetes Coordinator Inpatient Glycemic Control Team Team Pager: [phone] (8a-5p)

## 2022-04-13 NOTE — Progress Notes (Signed)
IV team paged for central line lab draw. Waiting for call back. Day shift nurse aware.

## 2022-04-13 NOTE — Evaluation (Signed)
Clinical/Bedside Swallow Evaluation Patient Details  Name: Eric Scott MRN: [id number] Date of Birth: [birth year]  Today's Date: 04/13/2022 Time: SLP Start Time (ACUTE ONLY): 34 SLP Stop Time (ACUTE ONLY): 1203 SLP Time Calculation (min) (ACUTE ONLY): 10 min  Past Medical History:  Past Medical History:  Diagnosis Date   Anemia    Anxiety    Chronic pain    legs, back; MRI 05/2012 with mild thoracic degenerative changes no spinal stenosis    CKD (chronic kidney disease) 05/12/2019   Stage IV   COPD (chronic obstructive pulmonary disease) ([place])    COVID-19    Depression    Diabetic peripheral neuropathy ([place])    "chronic" (24/58/0998)   Diastolic CHF ([name])    Duodenal ulcer hemorrhage    DVT (deep venous thrombosis) ([name])    right upper arm   GERD (gastroesophageal reflux disease)    Hypercholesteremia    Hypertension    Iron deficiency anemia    Osteomyelitis of ankle ([name])    Peripheral edema    PVD (peripheral vascular disease) ([place])    Spinal stenosis    mild lumbar (MRI 05/2012)-L2-L3 to L4-L5 , mild lumbar foraminal stenosis    Stroke ([place]) 05/2012   Subacute, lacunar infarcts within the left basal ganglia and posterior limp of the left internal capsule/thalamus; "RUE; both feet weak" (05/27/2012)   Tremor    Type II diabetes mellitus ([place])    Past Surgical History:  Past Surgical History:  Procedure Laterality Date   ANKLE SURGERY     APPLICATION OF WOUND VAC Left 03/28/2022   Procedure: APPLICATION OF WOUND VAC;  Surgeon: [name], MD;  Location: [facility];  Service: Orthopedics;  Laterality: Left;   ESOPHAGOGASTRODUODENOSCOPY (EGD) WITH PROPOFOL N/A 12/21/2016   Rourk: Ulcerative reflux esophagitis, erosive gastropathy, extensive duodenal ulceration likely site of bleeding.  Pathology with mild gastritis, no H. pylori   ESOPHAGOGASTRODUODENOSCOPY (EGD) WITH PROPOFOL N/A 04/02/2017   Fields: ESOPAHGITIS/GASTRIC AND DUODENAL ULCERS HEALED. mild duodenitis    HARDWARE REMOVAL Left 03/28/2022   Procedure: LEFT ANKLE REMOVAL DEEP HARDWARE, REMOVE BONE AND APPLY TISSUE GRAFT;  Surgeon: [name], MD;  Location: [facility];  Service: Orthopedics;  Laterality: Left;   HERNIA REPAIR  01/05/2004   "belly button" (05/27/2012)   IR FLUORO GUIDE CV LINE RIGHT  04/03/2022   IR US GUIDE VASC ACCESS RIGHT  04/03/2022   HPI:  Patient is a 71 y.o.  male with history of COPD on 3-4 L of oxygen at home, HFpEF, DM-2, CKD stage IV-who was recently hospitalized (10/25-11/8) for left ankle osteomyelitis-s/p hardware removal and debridement (polymicrobial infection with Acinetobacter/Morganella/Enterococcus faecalis/Staph aureus)-hospital course was complicated by worsening respiratory failure requiring BiPAP in the setting of volume overload/COPD exacerbation-patient was stabilized-and subsequently discharged to SNF on 11/8,. Pt returned to [facility] ED on 11/9 with worsening hypoxemia-and was found to have new left upper lobe PNA.    Assessment / Plan / Recommendation  Clinical Impression  Pt presented with functional oropharyngeal swallow in setting of current pna/respiratory status. Oromotor abilities were intact and missing posterior dentition (mandibular and maxillary). He exhibited one delayed cough at end of session that did not appear related to a specific consistency. Slightly increased work of breathing likely due to current respiratory status with SpO2 remaining 93% throughout. Pt educated to take breaks during meal, small/single sips and upright positioning. Continue regular texture, thin liquids, pills whole in puree if needed. Will follow for safety and efficiency with recommendations. SLP  Visit Diagnosis: Dysphagia, unspecified (R13.10)    Aspiration Risk  Mild aspiration risk    Diet Recommendation Regular;Thin liquid   Liquid Administration via: Straw;Cup Medication Administration: Whole meds with puree Supervision: Patient able to self feed Compensations:  Slow rate;Small sips/bites (rest breaks when SOB) Postural Changes: Seated upright at 90 degrees    Other  Recommendations Oral Care Recommendations: Oral care BID    Recommendations for follow up therapy are one component of a multi-disciplinary discharge planning process, led by the attending physician.  Recommendations may be updated based on patient status, additional functional criteria and insurance authorization.  Follow up Recommendations Follow physician's recommendations for discharge plan and follow up therapies      Assistance Recommended at Discharge    Functional Status Assessment Patient has had a recent decline in their functional status and demonstrates the ability to make significant improvements in function in a reasonable and predictable amount of time.  Frequency and Duration min 2x/week  2 weeks       Prognosis Prognosis for Safe Diet Advancement: Good      Swallow Study   General Date of Onset: 04/12/22 HPI: Patient is a 71 y.o.  male with history of COPD on 3-4 L of oxygen at home, HFpEF, DM-2, CKD stage IV-who was recently hospitalized (10/25-11/8) for left ankle osteomyelitis-s/p hardware removal and debridement (polymicrobial infection with Acinetobacter/Morganella/Enterococcus faecalis/Staph aureus)-hospital course was complicated by worsening respiratory failure requiring BiPAP in the setting of volume overload/COPD exacerbation-patient was stabilized-and subsequently discharged to SNF on 11/8,. Pt returned to [facility] ED on 11/9 with worsening hypoxemia-and was found to have new left upper lobe PNA. Type of Study: Bedside Swallow Evaluation Previous Swallow Assessment: none Diet Prior to this Study: Regular;Thin liquids Temperature Spikes Noted: No Respiratory Status: Nasal cannula History of Recent Intubation: No Behavior/Cognition: Alert;Cooperative;Pleasant mood Oral Cavity Assessment: Within Functional Limits Oral Care Completed by SLP: No Oral Cavity -  Dentition: Missing dentition (missing posterior) Vision: Functional for self-feeding Self-Feeding Abilities: Needs assist Patient Positioning: Upright in bed Baseline Vocal Quality: Normal Volitional Cough: Strong Volitional Swallow: Able to elicit    Oral/Motor/Sensory Function Overall Oral Motor/Sensory Function: Within functional limits   Ice Chips Ice chips: Not tested   Thin Liquid Thin Liquid: Within functional limits Presentation: Straw    Nectar Thick Nectar Thick Liquid: Not tested   Honey Thick Honey Thick Liquid: Not tested   Puree Puree: Not tested   Solid     Solid: Within functional limits      Houston Siren 04/13/2022,2:05 PM

## 2022-04-13 NOTE — Progress Notes (Signed)
  Transition of Care [facility]) Screening Note   Patient Details  Name: Eric Scott Date of Birth: 01/04/1951   Transition [facility] Care [facility]) CM/SW Contact:    Benard Halsted, LCSW Phone Number: 04/13/2022, 9:07 AM    Transition of Care Department [facility]) has reviewed patient from [place] where he was receiving iv antibiotics. We will continue to monitor patient advancement through interdisciplinary progression rounds. If new patient transition needs arise, please place a TOC consult.

## 2022-04-13 NOTE — Evaluation (Signed)
Physical Therapy Evaluation Patient Details Name: Eric Scott MRN: [id number] DOB: [birth year] Today's Date: 04/13/2022  History of Present Illness  71 yo male presents to [facility] on 11/9 with chest pain and ShOB, workup for sepsis due to possible PNA. Recent admission 10/25 and d/c 11/8 for ulcer and infected hardware L ankle and underwent hardware removal and wound vac placement. PMH: GERD, chronic LBP, COPD, DM2, peripheral neuropathy, HTN, IDA, PVD, CKD, CVA .  Clinical Impression   Pt presents with generalized weakness, dyspnea on exertion on 4LO2 with sats maintaining >=89%, impaired sitting balance, and decreased activity tolerance. Pt to benefit from acute PT to address deficits. Pt requiring min-mod assist for transfer to/from EOB, PT initiating scoot training along EOB as pt wants to be more indep mobilizing OOB, w/c level or with RW. PT recommendations below. PT to progress mobility as tolerated, and will continue to follow acutely.         Recommendations for follow up therapy are one component of a multi-disciplinary discharge planning process, led by the attending physician.  Recommendations may be updated based on patient status, additional functional criteria and insurance authorization.  Follow Up Recommendations Skilled nursing-short term rehab (<3 hours/day) Can patient physically be transported by private vehicle: No    Assistance Recommended at Discharge Intermittent Supervision/Assistance  Patient can return home with the following  A lot of help with bathing/dressing/bathroom;Assist for transportation;A lot of help with walking and/or transfers    Equipment Recommendations None recommended by PT  Recommendations for Other Services       Functional Status Assessment Patient has had a recent decline in their functional status and/or demonstrates limited ability to make significant improvements in function in a reasonable and predictable amount of time     Precautions  / Restrictions Precautions Precautions: Fall Precaution Comments: L wound vac, RLE neuropathy Restrictions Weight Bearing Restrictions: Yes LLE Weight Bearing: Weight bearing as tolerated      Mobility  Bed Mobility Overal bed mobility: Needs Assistance Bed Mobility: Rolling, Sidelying to Sit, Sit to Sidelying Rolling: Min assist Sidelying to sit: Mod assist     Sit to sidelying: Mod assist General bed mobility comments: truncal and LE assist to/from EOB.    Transfers Overall transfer level: Needs assistance   Transfers: Bed to chair/wheelchair/BSC            Lateral/Scoot Transfers: Mod assist General transfer comment: scoot x2 towards L with assist for power up through trunk and UEs, hip translation    Ambulation/Gait                  Stairs            Wheelchair Mobility    Modified Rankin (Stroke Patients Only)       Balance Overall balance assessment: Needs assistance Sitting-balance support: Bilateral upper extremity supported, Feet unsupported Sitting balance-Leahy Scale: Fair                                       Pertinent Vitals/Pain Pain Assessment Pain Assessment: Faces Faces Pain Scale: Hurts little more Pain Location: L ankle Pain Descriptors / Indicators: Sore, Tightness Pain Intervention(s): Limited activity within patient's tolerance, Monitored during session, Repositioned    Home Living Family/patient expects to be discharged to:: Skilled nursing facility  Prior Function Prior Level of Function : Needs assist             Mobility Comments: Patient reports lift used for bed to w/c, has not been able to walk but hopes he can again one day. Propels manual w/c with assist, takes meals in room ADLs Comments: Bedlevel ADL     Hand Dominance   Dominant Hand: Right    Extremity/Trunk Assessment   Upper Extremity Assessment Upper Extremity Assessment: Defer to OT  evaluation LUE Deficits / Details: Prior R CVA LUE Sensation: decreased light touch    Lower Extremity Assessment RLE Deficits / Details: B knee flex contractures, lacking ~15 deg knee extension bilat. able to perform min ROM LAQ, DF/PF RLE Sensation: history of peripheral neuropathy RLE Coordination: decreased gross motor LLE Deficits / Details: wound vac, knee ROM limited as RLE LLE Sensation: history of peripheral neuropathy    Cervical / Trunk Assessment Cervical / Trunk Assessment: Kyphotic  Communication   Communication: No difficulties  Cognition Arousal/Alertness: Awake/alert Behavior During Therapy: WFL for tasks assessed/performed Overall Cognitive Status: Within Functional Limits for tasks assessed                                          General Comments General comments (skin integrity, edema, etc.): 4LO2, SpO64mn observed 89%    Exercises     Assessment/Plan    PT Assessment Patient needs continued PT services  PT Problem List Decreased strength;Decreased range of motion;Decreased activity tolerance;Decreased balance;Decreased mobility;Decreased knowledge of use of DME;Decreased knowledge of precautions;Pain;Impaired sensation;Decreased safety awareness       PT Treatment Interventions Functional mobility training;Therapeutic activities;Therapeutic exercise;Balance training    PT Goals (Current goals can be found in the Care Plan section)  Acute Rehab PT Goals Patient Stated Goal: work on transitioning OOB PT Goal Formulation: With patient Time For Goal Achievement: 04/27/22 Potential to Achieve Goals: Good    Frequency Min 2X/week     Co-evaluation               AM-PAC PT "6 Clicks" Mobility  Outcome Measure Help needed turning from your back to your side while in a flat bed without using bedrails?: A Little Help needed moving from lying on your back to sitting on the side of a flat bed without using bedrails?: A Lot Help  needed moving to and from a bed to a chair (including a wheelchair)?: Total Help needed standing up from a chair using your arms (e.g., wheelchair or bedside chair)?: Total Help needed to walk in hospital room?: Total Help needed climbing 3-5 steps with a railing? : Total 6 Click Score: 9    End of Session Equipment Utilized During Treatment: Oxygen Activity Tolerance: Patient tolerated treatment well Patient left: in bed;with call bell/phone within reach;with bed alarm set Nurse Communication: Mobility status PT Visit Diagnosis: Difficulty in walking, not elsewhere classified (R26.2);Muscle weakness (generalized) (M62.81);Pain Pain - Right/Left: Left Pain - part of body: Ankle and joints of foot    Time: 13[address]-7026PT Time Calculation (min) (ACUTE ONLY): 21 min   Charges:   PT Evaluation $PT Eval Low Complexity: 1 Low          Mamoru Takeshita S, PT DPT Acute Rehabilitation Services Pager [phone] Office [phone]  Obdulia Steier E Stroup 04/13/2022, 1:50 PM

## 2022-04-13 NOTE — Evaluation (Signed)
Occupational Therapy Evaluation Patient Details Name: Eric Scott MRN: [id number] DOB: 12-Apr-1951 Today's Date: 04/13/2022   History of Present Illness 71 yo male presents to [facility] on 11/9 with chest pain and ShOB, workup for sepsis due to possible PNA. Recent admission 10/25 and d/c 11/8 for ulcer and infected hardware L ankle and underwent hardware removal and wound vac placement. PMH: GERD, chronic LBP, COPD, DM2, peripheral neuropathy, HTN, IDA, PVD, CKD, CVA .   Clinical Impression   Nice gentleman admitted for the diagnosis above.  PTA he resides at a local SNF, utilizing a hoyer for out of bed to wheelchair, and bedlevel ADL.  Patient is able to feed himself and participate with grooming and upper body ADL seated.  Patient is not demonstrating a significant change from his baseline status.  No pressing OT needs exist in the acute setting, and recommend return to SNF with prior level of supports.       Recommendations for follow up therapy are one component of a multi-disciplinary discharge planning process, led by the attending physician.  Recommendations may be updated based on patient status, additional functional criteria and insurance authorization.   Follow Up Recommendations  Skilled nursing-short term rehab (<3 hours/day)    Assistance Recommended at Discharge Frequent or constant Supervision/Assistance  Patient can return home with the following A lot of help with bathing/dressing/bathroom;Two people to help with walking and/or transfers;Assist for transportation    Functional Status Assessment  Patient has had a recent decline in their functional status and demonstrates the ability to make significant improvements in function in a reasonable and predictable amount of time.  Equipment Recommendations  None recommended by OT    Recommendations for Other Services       Precautions / Restrictions Precautions Precautions: Fall Precaution Comments: L wound vac, RLE  neuropathy Restrictions Weight Bearing Restrictions: Yes LLE Weight Bearing: Weight bearing as tolerated      Mobility Bed Mobility Overal bed mobility: Needs Assistance Bed Mobility: Rolling, Sidelying to Sit, Sit to Sidelying Rolling: Min assist Sidelying to sit: Mod assist     Sit to sidelying: Mod assist General bed mobility comments: pt able to roll R and L with heavy use of rails.  Mod A for LE's off bed and elevation of trunk into sitting with HOB elevated. Min/Mod A to LE's for return to supine.    Transfers                          Balance Overall balance assessment: Needs assistance Sitting-balance support: Bilateral upper extremity supported, Feet unsupported Sitting balance-Leahy Scale: Fair                                     ADL either performed or assessed with clinical judgement   ADL Overall ADL's : At baseline                                       General ADL Comments: Able to feed himself, and perform grooming with setup.  Bedlevel ADL at baseline.     Vision Baseline Vision/History: 1 Wears glasses Patient Visual Report: No change from baseline       Perception Perception Perception: Within Functional Limits   Praxis Praxis Praxis: Intact    Pertinent Vitals/Pain Pain  Assessment Pain Assessment: No/denies pain Pain Intervention(s): Monitored during session     Hand Dominance Right   Extremity/Trunk Assessment Upper Extremity Assessment Upper Extremity Assessment: Overall WFL for tasks assessed;LUE deficits/detail LUE Deficits / Details: Prior R CVA affected MMT to LUE, but still uses the hand/arm functionally.  Able to move against gravity. LUE Sensation: decreased light touch   Lower Extremity Assessment Lower Extremity Assessment: Defer to PT evaluation       Communication Communication Communication: No difficulties   Cognition Arousal/Alertness: Awake/alert Behavior During Therapy:  WFL for tasks assessed/performed Overall Cognitive Status: Within Functional Limits for tasks assessed                                       General Comments   VSS     Exercises     Shoulder Instructions      Home Living Family/patient expects to be discharged to:: Skilled nursing facility                                        Prior Functioning/Environment Prior Level of Function : Needs assist             Mobility Comments: Patient reports lift used for bed to w/c, has not been able to walk but hopes he can again one day. Propels manual w/c with assist, takes meals in room ADLs Comments: Bedlevel ADL        OT Problem List: Decreased strength;Decreased activity tolerance;Impaired balance (sitting and/or standing)      OT Treatment/Interventions:      OT Goals(Current goals can be found in the care plan section) Acute Rehab OT Goals Patient Stated Goal: Hopefully get better OT Goal Formulation: With patient Time For Goal Achievement: 04/27/22 Potential to Achieve Goals: Fair  OT Frequency:      Co-evaluation              AM-PAC OT "6 Clicks" Daily Activity     Outcome Measure Help from another person eating meals?: None Help from another person taking care of personal grooming?: A Little Help from another person toileting, which includes using toliet, bedpan, or urinal?: A Lot Help from another person bathing (including washing, rinsing, drying)?: A Lot Help from another person to put on and taking off regular upper body clothing?: A Little Help from another person to put on and taking off regular lower body clothing?: A Lot 6 Click Score: 16   End of Session Nurse Communication: Mobility status  Activity Tolerance: Patient tolerated treatment well Patient left: in bed;with call bell/phone within reach  OT Visit Diagnosis: Muscle weakness (generalized) (M62.81)                Time: 6073-7106 OT Time Calculation  (min): 19 min Charges:  OT General Charges $OT Visit: 1 Visit OT Evaluation $OT Eval Moderate Complexity: 1 Mod  04/13/2022  RP, OTR/L  Acute Rehabilitation Services  Office:  [phone]   Metta Clines 04/13/2022, 9:36 AM

## 2022-04-13 NOTE — Progress Notes (Addendum)
PROGRESS NOTE        PATIENT DETAILS Name: Eric Scott Age: 71 y.o. Sex: male Date of Birth: [birth year] Admit Date: 04/12/2022 Admitting Physician Deatra James, MD WNU:[id number], Fara Olden, MD  Brief Summary: Patient is a 71 y.o.  male with history of COPD on 3-4 L of oxygen at home, HFpEF, DM-2, CKD stage IV-who was recently hospitalized (10/25-11/8) for left ankle osteomyelitis-s/p hardware removal and debridement (polymicrobial infection with Acinetobacter/Morganella/Enterococcus faecalis/Staph aureus)-hospital course was complicated by worsening respiratory failure requiring BiPAP in the setting of volume overload/COPD exacerbation-patient was stabilized-and subsequently discharged to SNF on 11/8, unfortunately patient returned to [facility] ED on 11/9 with worsening hypoxemia-and was found to have new left upper lobe PNA and admitted to the hospitalist service.  See below for further details.   Significant events: 10/25-11/8>> hospitalization for left ankle osteomyelitis-s/p hardware removal-developed worsening hypoxemia due to COPD exacerbation/CHF and A-fib RVR.  Stabilized-ultimately discharged to SNF.  Significant studies: 11/10>> CT chest: Large left upper lobe opacity-likely PNA.  Significant microbiology data: 10/25>> left ankle wound: Morganella/Acinetobacter/Enterococcus faecalis/MRSA/VRE 11/9>> COVID PCR: Negative 11/9>> blood culture: No growth  Procedures: 10/25>> left ankle hardware removal by Dr. Sharol Given.  Consults: None  Subjective: Lying comfortably in bed-denies any chest pain or shortness of breath.  Objective: Vitals: Blood pressure (!) 130/96, pulse 72, temperature 97.6 F (36.4 C), temperature source Oral, resp. rate 17, height 6' (1.829 m), weight 129.5 kg, SpO2 95 %.   Exam: Gen Exam:Alert awake-not in any distress.  Chronically sick appearing. HEENT:atraumatic, normocephalic Chest: Transmitted upper airway sounds-coarse rhonchi all  over. CVS:S1S2 regular Abdomen:soft non tender, non distended Extremities:+ edema.  Left leg-wound VAC in place. Neurology: Non focal Skin: no rash  Pertinent Labs/Radiology:    Latest Ref Rng & Units 04/13/2022    2:33 AM 04/12/2022    5:31 AM 04/11/2022    5:45 AM  CBC  WBC 4.0 - 10.5 K/uL 11.2  20.1  13.1   Hemoglobin 13.0 - 17.0 g/dL 9.2  10.4  9.5   Hematocrit 39.0 - 52.0 % 28.2  32.2  29.5   Platelets 150 - 400 K/uL 270  388  264     Lab Results  Component Value Date   NA 136 04/13/2022   K 4.6 04/13/2022   CL 96 (L) 04/13/2022   CO2 27 04/13/2022      Assessment/Plan: Acute on chronic hypoxic respiratory failure 2/2 Left upper lobe PNA/COPD exacerbation Improved-back on 3-4 L of oxygen this morning Continue broad-spectrum antibiotics, transition to prednisone-with plans to taper in a few days. Follow cultures SLP eval to ensure no silent aspiration  Severe sepsis Due to left upper lobe PNA Follow cultures On broad-spectrum antibiotics  Polymicrobial left ankle osteomyelitis-s/p debridement/hardware removal by Dr. Sharol Given on 10/25 Wound VAC in place ID/Ortho consulted-await recommendations On broad-spectrum Abx with Zyvox, meropenem  COPD exacerbation Improved-continue bronchodilators-transition to prednisone Encourage incentive spirometry  AKI on CKD stage IV AKI likely hemodynamically mediated Slight bump in creatinine today-hold diuretics for now.  Acute on chronic HFpEF Volume status relatively stable-some mild lower extremity edema but otherwise stable Hold diuretics-due to slight bump in creatinine Reassess tomorrow.  Paroxysmal atrial flutter In and out of A-fib/flutter Rate controlled with amiodarone/metoprolol On Eliquis Continue telemetry monitoring  Normocytic anemia Combination of CKD/acute illness No evidence of blood loss Follow CBC closely-on Eliquis.  HTN BP stable Continue amlodipine, metoprolol Follow/adjust  DM-2 (A1c 5.8  on 10/25) Continue 20 units of Semglee/SSI Steroids being tapered down today-reassess for further adjustment on 11/11   Recent Labs    04/12/22 2101 04/13/22 0651 04/13/22 0755  GLUCAP 393* 173* 168*    Gout Not in flare Continue colchicine  BPH Continue Flomax Frequent bladder scans to ensure no retention given fluctuating renal function.  Chronic peripheral neuropathy/chronic back pain Continue as needed narcotics/Neurontin  Mood disorder Stable-continue Prozac/Xanax  1.6 cm left thyroid nodule Incidental finding on CT chest Further work-up deferred to outpatient setting.  7 mm left lower lobe nodule Incidental finding on CT chest. Repeat CT chest 6-12 months.  4.3 cm ascending thoracic aneurysm Incidental finding on CT chest Annual imaging by CTA/MRA recommended.  Morbid Obesity: Estimated body mass index is 38.72 kg/m as calculated from the following:   Height as of this encounter: 6' (1.829 m).   Weight as of this encounter: 129.5 kg.   Code status:   Code Status: Full Code   DVT Prophylaxis: TED hose Start: 04/12/22 0815 SCDs Start: 04/12/22 0815 apixaban (ELIQUIS) tablet 5 mg    Family Communication: Son-Jeramine Jett-[phone]-left voicemail 11/10   Disposition Plan: Status is: Inpatient Remains inpatient appropriate because: Improving sepsis physiology/hypoxemia-not yet stable for discharge.   Planned Discharge Destination:Skilled nursing facility   Diet: Diet Order             Diet regular Room service appropriate? Yes; Fluid consistency: Thin  Diet effective now                     Antimicrobial agents: Anti-infectives (From admission, onward)    Start     Dose/Rate Route Frequency Ordered Stop   04/12/22 1000  meropenem (MERREM) injection 1 g  Status:  Discontinued       Note to Pharmacy: Indication:  Polymicrobial L-ankle osteo First Dose: Yes Last Day of Therapy:  05/09/22 Labs - Once weekly:  CBC/D and BMP, Labs -  Every other week:  ESR and CRP Rate: 200 mL/hour     1 g Intravenous Every 12 hours 04/12/22 0824 04/12/22 0857   04/12/22 1000  linezolid (ZYVOX) IVPB 600 mg        600 mg 300 mL/hr over 60 Minutes Intravenous Every 12 hours 04/12/22 0834     04/12/22 1000  meropenem (MERREM) 1 g in sodium chloride 0.9 % 100 mL IVPB        1 g 200 mL/hr over 30 Minutes Intravenous Every 12 hours 04/12/22 0857     04/12/22 0630  vancomycin (VANCOCIN) IVPB 1000 mg/200 mL premix        1,000 mg 200 mL/hr over 60 Minutes Intravenous  Once 04/12/22 0616 04/12/22 0859   04/12/22 0630  ceFEPIme (MAXIPIME) 2 g in sodium chloride 0.9 % 100 mL IVPB        2 g 200 mL/hr over 30 Minutes Intravenous  Once 04/12/22 0616 04/12/22 0757        MEDICATIONS: Scheduled Meds:  ALPRAZolam  0.5 mg Oral QHS   amiodarone  200 mg Oral BID   [START ON 04/19/2022] amiodarone  200 mg Oral Daily   amLODipine  10 mg Oral Daily   apixaban  5 mg Oral BID   Chlorhexidine Gluconate Cloth  6 each Topical Daily   colchicine  0.3 mg Oral Daily   ferrous sulfate  325 mg Oral BID   FLUoxetine  40 mg  Oral Daily   fluticasone furoate-vilanterol  1 puff Inhalation Daily   gabapentin  300 mg Oral QHS   HYDROcodone-acetaminophen  1 tablet Oral QID   insulin aspart  0-20 Units Subcutaneous TID AC & HS   insulin glargine-yfgn  20 Units Subcutaneous Daily   magnesium oxide  400 mg Oral Daily   methylPREDNISolone (SOLU-MEDROL) injection  40 mg Intravenous Daily   metolazone  2.5 mg Oral Q M,W,F   metoprolol tartrate  25 mg Oral BID   polyethylene glycol  17 g Oral Daily   potassium chloride  20 mEq Oral Daily   senna  2 tablet Oral BID   sodium chloride flush  3 mL Intravenous Q12H   sodium chloride flush  3 mL Intravenous Q12H   tamsulosin  0.4 mg Oral Q supper   torsemide  80 mg Oral BID   Continuous Infusions:  sodium chloride     linezolid (ZYVOX) IV 600 mg (04/12/22 2200)   meropenem (MERREM) IV 1 g (04/12/22 2249)   PRN  Meds:.sodium chloride, acetaminophen **OR** acetaminophen, bisacodyl, hydrALAZINE, HYDROmorphone (DILAUDID) injection, ipratropium, ipratropium-albuterol, levalbuterol, ondansetron **OR** ondansetron (ZOFRAN) IV, oxyCODONE, senna-docusate, sodium chloride flush, sodium phosphate, traZODone   I have personally reviewed following labs and imaging studies  LABORATORY DATA: CBC: Recent Labs  Lab 04/09/22 0155 04/10/22 0445 04/11/22 0545 04/12/22 0531 04/13/22 0233  WBC 9.6 12.1* 13.1* 20.1* 11.2*  NEUTROABS  --   --   --  15.2*  --   HGB 8.4* 9.0* 9.5* 10.4* 9.2*  HCT 25.2* 27.3* 29.5* 32.2* 28.2*  MCV 93.3 92.9 93.1 93.9 92.2  PLT 244 262 264 388 443    Basic Metabolic Panel: Recent Labs  Lab 04/09/22 0155 04/10/22 0445 04/11/22 0545 04/12/22 0531 04/12/22 0833 04/13/22 0233  NA 138 140 137 139  --  136  K 5.0 4.3 5.6* 4.3  --  4.6  CL 102 106 98 100  --  96*  CO2 _0 --  27  GLUCOSE 293* 225* 333* 206*  --  256*  BUN 81* 77* 90* 92*  --  99*  CREATININE 3.03* 2.88* 3.12* 3.19*  --  3.33*  CALCIUM 8.6* 7.8* 8.6* 8.4*  --  8.5*  MG 1.9  --   --   --  2.0  --   PHOS  --   --   --   --  4.6  --     GFR: Estimated Creatinine Clearance: 28.3 mL/min (A) (by C-G formula based on SCr of 3.33 mg/dL (H)).  Liver Function Tests: Recent Labs  Lab 04/12/22 0531  AST 12*  ALT 12  ALKPHOS 62  BILITOT 0.7  PROT 6.7  ALBUMIN 2.9*   No results for input(s): "LIPASE", "AMYLASE" in the last 168 hours. No results for input(s): "AMMONIA" in the last 168 hours.  Coagulation Profile: Recent Labs  Lab 04/12/22 0924 04/13/22 0233  INR 1.4* 1.4*    Cardiac Enzymes: No results for input(s): "CKTOTAL", "CKMB", "CKMBINDEX", "TROPONINI" in the last 168 hours.  BNP (last 3 results) No results for input(s): "PROBNP" in the last 8760 hours.  Lipid Profile: No results for input(s): "CHOL", "HDL", "LDLCALC", "TRIG", "CHOLHDL", "LDLDIRECT" in the last 72 hours.  Thyroid  Function Tests: No results for input(s): "TSH", "T4TOTAL", "FREET4", "T3FREE", "THYROIDAB" in the last 72 hours.  Anemia Panel: No results for input(s): "VITAMINB12", "FOLATE", "FERRITIN", "TIBC", "IRON", "RETICCTPCT" in the last 72 hours.  Urine analysis:  Component Value Date/Time   COLORURINE YELLOW 05/11/2020 2100   APPEARANCEUR HAZY (A) 05/11/2020 2100   LABSPEC 1.012 05/11/2020 2100   PHURINE 5.0 05/11/2020 2100   GLUCOSEU NEGATIVE 05/11/2020 2100   HGBUR NEGATIVE 05/11/2020 2100   BILIRUBINUR NEGATIVE 05/11/2020 2100   [place] NEGATIVE 05/11/2020 2100   PROTEINUR 30 (A) 05/11/2020 2100   UROBILINOGEN 0.2 03/04/2015 1835   NITRITE NEGATIVE 05/11/2020 2100   LEUKOCYTESUR MODERATE (A) 05/11/2020 2100    Sepsis Labs: Lactic Acid, Venous    Component Value Date/Time   LATICACIDVEN 1.1 04/12/2022 2197    MICROBIOLOGY: Recent Results (from the past 240 hour(s))  Culture, blood (routine x 2)     Status: None (Preliminary result)   Collection Time: 04/12/22  5:31 AM   Specimen: BLOOD  Result Value Ref Range Status   Specimen Description BLOOD CENTRAL LINE  Final   Special Requests   Final    BOTTLES DRAWN AEROBIC AND ANAEROBIC Blood Culture adequate volume   Culture   Final    NO GROWTH 1 DAY Performed at [facility], [address], [place], [place] [address]    Report Status PENDING  Incomplete  SARS Coronavirus 2 by RT PCR (hospital order, performed in [employer] hospital lab) *cepheid single result test* Anterior Nasal Swab     Status: None   Collection Time: 04/12/22  6:23 AM   Specimen: Anterior Nasal Swab  Result Value Ref Range Status   SARS Coronavirus 2 by RT PCR NEGATIVE NEGATIVE Final    Comment: (NOTE) SARS-CoV-2 target nucleic acids are NOT DETECTED.  The SARS-CoV-2 RNA is generally detectable in upper and lower respiratory specimens during the acute phase of infection. The lowest concentration of SARS-CoV-2 viral copies this assay can detect is  250 copies / mL. A negative result does not preclude SARS-CoV-2 infection and should not be used as the sole basis for treatment or other patient management decisions.  A negative result may occur with improper specimen collection / handling, submission of specimen other than nasopharyngeal swab, presence of viral mutation(s) within the areas targeted by this assay, and inadequate number of viral copies (<250 copies / mL). A negative result must be combined with clinical observations, patient history, and epidemiological information.  Fact Sheet for Patients:   https://www.patel.info/  Fact Sheet for Healthcare Providers: https://hall.com/  This test is not yet approved or  cleared by the [place] FDA and has been authorized for detection and/or diagnosis of SARS-CoV-2 by FDA under an Emergency Use Authorization (EUA).  This EUA will remain in effect (meaning this test can be used) for the duration of the COVID-19 declaration under Section 564(b)(1) of the Act, 21 U.S.C. section 360bbb-3(b)(1), unless the authorization is terminated or revoked sooner.  Performed at [facility], [address] [place], [place] [address]   Culture, blood (routine x 2)     Status: None (Preliminary result)   Collection Time: 04/12/22  6:32 AM   Specimen: BLOOD  Result Value Ref Range Status   Specimen Description BLOOD LEFT ARM  Final   Special Requests   Final    BOTTLES DRAWN AEROBIC ONLY Blood Culture adequate volume   Culture   Final    NO GROWTH 1 DAY Performed at [facility], [address] [place], [place] [address]    Report Status PENDING  Incomplete    RADIOLOGY STUDIES/RESULTS: CT CHEST WO CONTRAST  Result Date: 04/12/2022 CLINICAL DATA:  Chest pain, shortness of breath. EXAM: CT CHEST  WITHOUT CONTRAST TECHNIQUE: Multidetector CT imaging of the chest was performed following the standard protocol without IV contrast. RADIATION DOSE  REDUCTION: This exam was performed according to the departmental dose-optimization program which includes automated exposure control, adjustment of the mA and/or kV according to patient size and/or use of iterative reconstruction technique. COMPARISON:  July 20, 2014. FINDINGS: Cardiovascular: 4.3 cm ascending thoracic aorta is noted. Atherosclerosis of thoracic aorta is noted. Normal cardiac size. No pericardial effusion. Coronary artery calcifications are noted. Mediastinum/Nodes: 1.6 cm left thyroid nodule is noted. Esophagus is unremarkable. Mildly enlarged mediastinal adenopathy is noted with the largest lymph node 16 mm in right subcarinal region. 14 mm AP window lymph node is noted. 14 mm right paratracheal lymph node is noted. Lungs/Pleura: No pneumothorax is noted. Emphysematous disease is noted bilaterally. Small right pleural effusion is noted with adjacent subsegmental atelectasis. Large left upper lobe airspace opacity is noted concerning for pneumonia. Multiple patchy airspace opacities are noted in the left lower lobe as well as right upper lobe most concerning for atelectasis or possibly infiltrate. Grossly stable 16 mm nodule is noted in right upper lobe which can be considered benign at this point with no further follow-up required. 7 mm nodule is noted in left lower lobe best seen on image number 99 of series 4 which is not clearly visualized on prior exam. Upper Abdomen: Cholelithiasis. Musculoskeletal: No chest wall mass or suspicious bone lesions identified. IMPRESSION: Large left upper lobe airspace opacity is noted concerning for pneumonia. Multiple smaller patchy airspace opacities are noted in left lower lobe as well as the right upper lobe most concerning for atelectasis or multifocal pneumonia. Mildly enlarged mediastinal adenopathy is noted which may be inflammatory or infectious etiology, but neoplasm cannot be excluded. Follow-up chest CT in 3 months is recommended to ensure  stability or resolution. Small right pleural effusion is noted with associated subsegmental atelectasis. 7 mm nodule is noted in left lower lobe. Non-contrast chest CT at 6-12 months is recommended. If the nodule is stable at time of repeat CT, then future CT at 18-24 months (from today's scan) is considered optional for low-risk patients, but is recommended for high-risk patients. This recommendation follows the consensus statement: Guidelines for Management of Incidental Pulmonary Nodules Detected on CT Images: From the Fleischner Society 2017; Radiology 2017; 284:228-243. 4.3 cm ascending thoracic aortic aneurysm. Recommend annual imaging followup by CTA or MRA. This recommendation follows 2010 ACCF/AHA/AATS/ACR/ASA/SCA/SCAI/SIR/STS/SVM Guidelines for the Diagnosis and Management of Patients with Thoracic Aortic Disease. Circulation. 2010; 121: E081-K481. Aortic aneurysm NOS (ICD10-I71.9). 1.6 cm left thyroid nodule is noted. Recommend thyroid US. (Ref: J Am Coll Radiol. 2015 Feb;12(2): 143-50). Coronary artery calcifications are noted. Cholelithiasis. Aortic Atherosclerosis (ICD10-I70.0) and Emphysema (ICD10-J43.9). Electronically Signed   By: Marijo Conception M.D.   On: 04/12/2022 13:10   DG Chest Port 1 View  Result Date: 04/12/2022 CLINICAL DATA:  71 year old male with shortness of breath and chest pain. EXAM: PORTABLE CHEST 1 VIEW COMPARISON:  Portable chest 04/05/2022 and earlier. FINDINGS: Portable AP semi upright view at 0537 hours. Stable right IJ approach multi lumen central line. Lung volumes and mediastinal contours have not significantly changed. But there is confluent new left upper lobe pulmonary opacity, probably with a degree of consolidation. No superimposed pneumothorax or pleural effusion. Underlying coarse bilateral pulmonary interstitial opacity is stable and might be chronic. No other confluent lung opacity. Visualized tracheal air column is within normal limits. Stable visualized  osseous structures. IMPRESSION: 1. New left upper  lobe opacity is nonspecific but suspicious for Pneumonia. 2. Otherwise stable chest. Nonspecific pulmonary interstitial opacity which might be due to chronic lung disease. Electronically Signed   By: Genevie Ann M.D.   On: 04/12/2022 05:59     LOS: 1 day   Oren Binet, MD  [facility]    To contact the attending provider between 7A-7P or the covering provider during after hours 7P-7A, please log into the web site [email or web address] and access using universal [place] password for that web site. If you do not have the password, please call the hospital operator.  04/13/2022, 10:02 AM

## 2022-04-14 ENCOUNTER — Inpatient Hospital Stay (HOSPITAL_COMMUNITY): Payer: Medicare (Managed Care)

## 2022-04-14 DIAGNOSIS — N184 Chronic kidney disease, stage 4 (severe): Secondary | ICD-10-CM | POA: Diagnosis not present

## 2022-04-14 DIAGNOSIS — I739 Peripheral vascular disease, unspecified: Secondary | ICD-10-CM

## 2022-04-14 DIAGNOSIS — M86172 Other acute osteomyelitis, left ankle and foot: Secondary | ICD-10-CM | POA: Diagnosis not present

## 2022-04-14 DIAGNOSIS — A419 Sepsis, unspecified organism: Secondary | ICD-10-CM | POA: Diagnosis not present

## 2022-04-14 LAB — CBC
HCT: 27.9 % — ABNORMAL LOW (ref 39.0–52.0)
Hemoglobin: 9.2 g/dL — ABNORMAL LOW (ref 13.0–17.0)
MCH: 30.7 pg (ref 26.0–34.0)
MCHC: 33 g/dL (ref 30.0–36.0)
MCV: 93 fL (ref 80.0–100.0)
Platelets: 265 10*3/uL (ref 150–400)
RBC: 3 MIL/uL — ABNORMAL LOW (ref 4.22–5.81)
RDW: 14.4 % (ref 11.5–15.5)
WBC: 11.7 10*3/uL — ABNORMAL HIGH (ref 4.0–10.5)
nRBC: 0 % (ref 0.0–0.2)

## 2022-04-14 LAB — BASIC METABOLIC PANEL
Anion gap: 11 (ref 5–15)
BUN: 108 mg/dL — ABNORMAL HIGH (ref 8–23)
CO2: 29 mmol/L (ref 22–32)
Calcium: 8.6 mg/dL — ABNORMAL LOW (ref 8.9–10.3)
Chloride: 98 mmol/L (ref 98–111)
Creatinine, Ser: 3.7 mg/dL — ABNORMAL HIGH (ref 0.61–1.24)
GFR, Estimated: 17 mL/min — ABNORMAL LOW (ref >60)
Glucose, Bld: 172 mg/dL — ABNORMAL HIGH (ref 70–99)
Potassium: 5.7 mmol/L — ABNORMAL HIGH (ref 3.5–5.1)
Sodium: 138 mmol/L (ref 135–145)

## 2022-04-14 LAB — GLUCOSE, CAPILLARY
Glucose-Capillary: 145 mg/dL — ABNORMAL HIGH (ref 70–99)
Glucose-Capillary: 158 mg/dL — ABNORMAL HIGH (ref 70–99)
Glucose-Capillary: 203 mg/dL — ABNORMAL HIGH (ref 70–99)
Glucose-Capillary: 284 mg/dL — ABNORMAL HIGH (ref 70–99)
Glucose-Capillary: 341 mg/dL — ABNORMAL HIGH (ref 70–99)

## 2022-04-14 MED ORDER — SODIUM CHLORIDE 0.9% FLUSH: 10.0000 mL | INTRAVENOUS | Status: DC | PRN

## 2022-04-14 MED ORDER — ALTEPLASE 2 MG IJ SOLR
INTRAMUSCULAR | Status: AC
  Filled 2022-04-14: qty 2

## 2022-04-14 MED ORDER — IPRATROPIUM BROMIDE 0.02 % IN SOLN
0.5000 mg | Freq: Three times a day (TID) | RESPIRATORY_TRACT | Status: DC
  Administered 2022-04-14 – 2022-04-15 (×3): 0.5 mg via RESPIRATORY_TRACT
  Filled 2022-04-14 (×3): qty 2.5

## 2022-04-14 MED ORDER — ALTEPLASE 2 MG IJ SOLR
2.0000 mg | Freq: Once | INTRAMUSCULAR | Status: AC
  Administered 2022-04-14: 2 mg

## 2022-04-14 MED ORDER — SODIUM ZIRCONIUM CYCLOSILICATE 10 G PO PACK
10.0000 g | PACK | Freq: Three times a day (TID) | ORAL | Status: AC
  Administered 2022-04-14 (×3): 10 g via ORAL
  Filled 2022-04-14 (×3): qty 1

## 2022-04-14 MED ORDER — LEVALBUTEROL HCL 0.63 MG/3ML IN NEBU
0.6300 mg | INHALATION_SOLUTION | Freq: Three times a day (TID) | RESPIRATORY_TRACT | Status: DC
  Administered 2022-04-14 (×2): 0.63 mg via RESPIRATORY_TRACT
  Filled 2022-04-14 (×3): qty 3

## 2022-04-14 NOTE — Plan of Care (Signed)

## 2022-04-14 NOTE — Progress Notes (Signed)
Pharmacy Antibiotic Note  Eric Scott is a 71 y.o. male admitted on 04/12/2022 with    osteomyelitis of left ankle s/p debridement and removal of hardware on 03/28/22 . He was previously on daptomycin and meropenem via OPAT. He has a history of acinetobacter/morganella/enterococcus faecalis/MRSA via his would culture from 10/25. He presented to the ED with SOB and hypoxia and was readmitted. Pharmacy has been consulted for Zyvox/Merrem dosing.  His Scr is up to 3.70 today from 3.33 yesterday - baseline ~3.0.  His WBC's are down from 20k to 11k and he is afebrile.   Plan: Continue Zyvox 600 mg BID Continue Merrem 1000 mg IV every 12 hours Follow up renal function for meropenem adjustments if needed and follow clinical progress   Height: 6' (182.9 cm) Weight: 128.8 kg (283 lb 15.2 oz) IBW/kg (Calculated) : 77.6  Temp (24hrs), Avg:97.7 F (36.5 C), Min:97.5 F (36.4 C), Max:97.8 F (36.6 C)  Recent Labs  Lab 04/10/22 0445 04/11/22 0545 04/12/22 0531 04/12/22 0632 04/13/22 0233 04/14/22 0129  WBC 12.1* 13.1* 20.1*  --  11.2* 11.7*  CREATININE 2.88* 3.12* 3.19*  --  3.33* 3.70*  LATICACIDVEN  --   --  1.2 1.1  --   --      Estimated Creatinine Clearance: 25.4 mL/min (A) (by C-G formula based on SCr of 3.7 mg/dL (H)).    Allergies  Allergen Reactions   Baclofen     unknown   Blueberry Flavor Other (See Comments)    Unknown   Cucumber Extract Other (See Comments)    "Fells like I'm having a heart attack"   Flexeril [Cyclobenzaprine Hcl] Other (See Comments)    "whole body  Tremors" (05/27/2012)   Kiwi Extract Other (See Comments)    "feels like I'm having a heart attack"    Antimicrobials this admission: Zyvox 11/9 >> Merrem 10/27 >> Dapto 10/27 >>11/8 Zyvox 10/25 >> 10/27   Microbiology results: 11/9 Bcx: NG x 2 days  11/9 Sputum Cx: sent  10/25 Wound Cx: acinetobacter/morganella/enterococcus faecalis/MRSA  Thank you for allowing pharmacy to be a part of this  patient's care.  Vicenta Dunning, PharmD  PGY1 Pharmacy Resident

## 2022-04-14 NOTE — Progress Notes (Signed)
Pt is not using Bipap at this time

## 2022-04-14 NOTE — Progress Notes (Signed)
PROGRESS NOTE        PATIENT DETAILS Name: Eric Scott Age: 71 y.o. Sex: male Date of Birth: [birth year] Admit Date: 04/12/2022 Admitting Physician Eric James, MD [facility]:QPRFF, Eric Olden, MD  Brief Summary: Patient is a 71 y.o.  male with history of COPD on 3-4 L of oxygen at home, HFpEF, DM-2, CKD stage IV-who was recently hospitalized (10/25-11/8) for left ankle osteomyelitis-s/p hardware removal and debridement (polymicrobial infection with Acinetobacter/Morganella/Enterococcus faecalis/Staph aureus)-hospital course was complicated by worsening respiratory failure requiring BiPAP in the setting of volume overload/COPD exacerbation-patient was stabilized-and subsequently discharged to SNF on 11/8, unfortunately patient returned to [facility] ED on 11/9 with worsening hypoxemia-and was found to have new left upper lobe PNA and admitted to the hospitalist service.  See below for further details.   Significant events: 10/25-11/8>> hospitalization for left ankle osteomyelitis-s/p hardware removal-developed worsening hypoxemia due to COPD exacerbation/CHF and A-fib RVR.  Stabilized-ultimately discharged to SNF. 11/9>> presented to [facility] ED from SNF for worsening shortness of breath-CXR with left upper lobe PNA.  Transferred to [facility].  Significant studies: 11/10>> CT chest: Large left upper lobe opacity-likely PNA.  Significant microbiology data: 10/25>> left ankle wound: Morganella/Acinetobacter/Enterococcus faecalis/MRSA/VRE 11/9>> COVID PCR: Negative 11/9>> blood culture: No growth  Procedures: 10/25>> left ankle hardware removal by Dr. Sharol Scott.  Consults: None  Subjective: Breathing is better.  Lying flat.  Objective: Vitals: Blood pressure 130/63, pulse (!) 45, temperature 97.6 F (36.4 C), temperature source Oral, resp. rate 15, height 6' (1.829 m), weight 128.8 kg, SpO2 99 %.   Exam: Gen Exam:Alert awake-not in any distress HEENT:atraumatic,  normocephalic Chest: Moving air-only a few scattered rhonchi-markedly better than yesterday. CVS:S1S2 regular Abdomen:soft non tender, non distended Extremities:+ edema Neurology: Non focal Skin: no rash  Pertinent Labs/Radiology:    Latest Ref Rng & Units 04/14/2022    1:29 AM 04/13/2022    2:33 AM 04/12/2022    5:31 AM  CBC  WBC 4.0 - 10.5 K/uL 11.7  11.2  20.1   Hemoglobin 13.0 - 17.0 g/dL 9.2  9.2  10.4   Hematocrit 39.0 - 52.0 % 27.9  28.2  32.2   Platelets 150 - 400 K/uL 265  270  388     Lab Results  Component Value Date   NA 138 04/14/2022   K 5.7 (H) 04/14/2022   CL 98 04/14/2022   CO2 29 04/14/2022      Assessment/Plan: Acute on chronic hypoxic respiratory failure 2/2 Left upper lobe PNA/COPD exacerbation Significantly better-back on 3-4 L of oxygen.  Leukocytosis has resolved Appreciate SLP eval  Continue broad-spectrum antimicrobial therapy  Continue prednisone for a few more days  Follow cultures.   Severe sepsis Due to left upper lobe PNA Sepsis physiology resolved On broad-spectrum antibiotics Cultures negative to date.  Polymicrobial left ankle osteomyelitis-s/p debridement/hardware removal by Dr. Sharol Scott on 10/25 Wound VAC in place Appreciate Dr. Sharol Scott input Wound VAC in place On broad-spectrum antimicrobial therapy-Zyvox/meropenem.  Per ID note-end date of antibiotics 12/6.  COPD exacerbation Improved-continue bronchodilators-transition to prednisone Encourage incentive spirometry  AKI on CKD stage IV AKI likely hemodynamically mediated Creatinine continues to slightly uptrend  Continue to hold diuretics  Frequent bladder scans to ensure no retention   Hyperkalemia In the setting of worsening renal function Lokelma x3 doses today If no improvement-renal eval  Acute on chronic HFpEF Volume  status relatively stable-mild lower extremity edema continues  Holding diuretics due to worsening renal function  Reassess tomorrow   Paroxysmal  atrial flutter In and out of A-fib/flutter Rate controlled with amiodarone/metoprolol On Eliquis Continue telemetry monitoring  Normocytic anemia Combination of CKD/acute illness No evidence of blood loss Follow CBC closely-on Eliquis.  HTN BP stable Continue amlodipine, metoprolol Follow/adjust  DM-2 (A1c 5.8 on 10/25) CBGs stable  Continue Semglee 20 units daily and SSI   Recent Labs    04/14/22 0612 04/14/22 0824 04/14/22 1159  GLUCAP 145* 158* 203*     Gout Not in flare Continue colchicine  BPH Continue Flomax Frequent bladder scans to ensure no retention Scott fluctuating renal function.  Chronic peripheral neuropathy/chronic back pain Continue as needed narcotics/Neurontin  Mood disorder Stable-continue Prozac/Xanax  1.6 cm left thyroid nodule Incidental finding on CT chest Further work-up deferred to outpatient setting.  7 mm left lower lobe nodule Incidental finding on CT chest. Repeat CT chest 6-12 months.  4.3 cm ascending thoracic aneurysm Incidental finding on CT chest Annual imaging by CTA/MRA recommended.  Morbid Obesity: Estimated body mass index is 38.51 kg/m as calculated from the following:   Height as of this encounter: 6' (1.829 m).   Weight as of this encounter: 128.8 kg.   Code status:   Code Status: Full Code   DVT Prophylaxis: TED hose Start: 04/12/22 0815 SCDs Start: 04/12/22 0815 apixaban (ELIQUIS) tablet 5 mg    Family Communication:  Son-Eric Scott-[phone]-on 11/10 left voicemail Daughter-Eric Scott-[phone]-on 11/11-left voicemail   Disposition Plan: Status is: Inpatient Remains inpatient appropriate because: Improving sepsis physiology/hypoxemia-not yet stable for discharge.   Planned Discharge Destination:Skilled nursing facility likely early next week.   Diet: Diet Order             Diet heart healthy/carb modified Room service appropriate? Yes; Fluid consistency: Thin; Fluid restriction:  1500 mL Fluid  Diet effective now                     Antimicrobial agents: Anti-infectives (From admission, onward)    Start     Dose/Rate Route Frequency Ordered Stop   04/13/22 1600  linezolid (ZYVOX) tablet 600 mg        600 mg Oral Every 12 hours 04/13/22 1514     04/12/22 1000  meropenem (MERREM) injection 1 g  Status:  Discontinued       Note to Pharmacy: Indication:  Polymicrobial L-ankle osteo First Dose: Yes Last Day of Therapy:  05/09/22 Labs - Once weekly:  CBC/D and BMP, Labs - Every other week:  ESR and CRP Rate: 200 mL/hour     1 g Intravenous Every 12 hours 04/12/22 0824 04/12/22 0857   04/12/22 1000  linezolid (ZYVOX) IVPB 600 mg  Status:  Discontinued        600 mg 300 mL/hr over 60 Minutes Intravenous Every 12 hours 04/12/22 0834 04/13/22 1514   04/12/22 1000  meropenem (MERREM) 1 g in sodium chloride 0.9 % 100 mL IVPB        1 g 200 mL/hr over 30 Minutes Intravenous Every 12 hours 04/12/22 0857     04/12/22 0630  vancomycin (VANCOCIN) IVPB 1000 mg/200 mL premix        1,000 mg 200 mL/hr over 60 Minutes Intravenous  Once 04/12/22 0616 04/12/22 0859   04/12/22 0630  ceFEPIme (MAXIPIME) 2 g in sodium chloride 0.9 % 100 mL IVPB        2 g  200 mL/hr over 30 Minutes Intravenous  Once 04/12/22 0616 04/12/22 0757        MEDICATIONS: Scheduled Meds:  ALPRAZolam  0.5 mg Oral QHS   amiodarone  200 mg Oral BID   [START ON 04/19/2022] amiodarone  200 mg Oral Daily   amLODipine  10 mg Oral Daily   apixaban  5 mg Oral BID   arformoterol  15 mcg Nebulization BID   budesonide (PULMICORT) nebulizer solution  0.25 mg Nebulization BID   Chlorhexidine Gluconate Cloth  6 each Topical Daily   colchicine  0.3 mg Oral Daily   ferrous sulfate  325 mg Oral BID   FLUoxetine  40 mg Oral Daily   gabapentin  300 mg Oral QHS   HYDROcodone-acetaminophen  1 tablet Oral QID   insulin aspart  0-20 Units Subcutaneous TID AC & HS   insulin glargine-yfgn  20 Units Subcutaneous  Daily   levalbuterol  0.63 mg Nebulization TID   And   ipratropium  0.5 mg Nebulization Q8H   linezolid  600 mg Oral Q12H   magnesium oxide  400 mg Oral Daily   metoprolol tartrate  25 mg Oral BID   polyethylene glycol  17 g Oral Daily   predniSONE  40 mg Oral Q breakfast   revefenacin  175 mcg Nebulization Daily   senna  2 tablet Oral BID   sodium chloride flush  3 mL Intravenous Q12H   sodium chloride flush  3 mL Intravenous Q12H   sodium zirconium cyclosilicate  10 g Oral TID   tamsulosin  0.4 mg Oral Q supper   Continuous Infusions:  sodium chloride     meropenem (MERREM) IV 1 g (04/13/22 2236)   PRN Meds:.sodium chloride, acetaminophen **OR** acetaminophen, albuterol, bisacodyl, hydrALAZINE, HYDROmorphone (DILAUDID) injection, ondansetron **OR** ondansetron (ZOFRAN) IV, oxyCODONE, senna-docusate, sodium chloride flush, sodium chloride flush, sodium phosphate, traZODone   I have personally reviewed following labs and imaging studies  LABORATORY DATA: CBC: Recent Labs  Lab 04/10/22 0445 04/11/22 0545 04/12/22 0531 04/13/22 0233 04/14/22 0129  WBC 12.1* 13.1* 20.1* 11.2* 11.7*  NEUTROABS  --   --  15.2*  --   --   HGB 9.0* 9.5* 10.4* 9.2* 9.2*  HCT 27.3* 29.5* 32.2* 28.2* 27.9*  MCV 92.9 93.1 93.9 92.2 93.0  PLT 262 264 388 270 265     Basic Metabolic Panel: Recent Labs  Lab 04/09/22 0155 04/10/22 0445 04/11/22 0545 04/12/22 0531 04/12/22 0833 04/13/22 0233 04/14/22 0129  NA 138 140 137 139  --  136 138  K 5.0 4.3 5.6* 4.3  --  4.6 5.7*  CL 102 106 98 100  --  96* 98  CO2 _0 --  27 29  GLUCOSE 293* 225* 333* 206*  --  256* 172*  BUN 81* 77* 90* 92*  --  99* 108*  CREATININE 3.03* 2.88* 3.12* 3.19*  --  3.33* 3.70*  CALCIUM 8.6* 7.8* 8.6* 8.4*  --  8.5* 8.6*  MG 1.9  --   --   --  2.0  --   --   PHOS  --   --   --   --  4.6  --   --      GFR: Estimated Creatinine Clearance: 25.4 mL/min (A) (by C-G formula based on SCr of 3.7 mg/dL  (H)).  Liver Function Tests: Recent Labs  Lab 04/12/22 0531  AST 12*  ALT 12  ALKPHOS 62  BILITOT 0.7  PROT  6.7  ALBUMIN 2.9*    No results for input(s): "LIPASE", "AMYLASE" in the last 168 hours. No results for input(s): "AMMONIA" in the last 168 hours.  Coagulation Profile: Recent Labs  Lab 04/12/22 0924 04/13/22 0233  INR 1.4* 1.4*     Cardiac Enzymes: No results for input(s): "CKTOTAL", "CKMB", "CKMBINDEX", "TROPONINI" in the last 168 hours.  BNP (last 3 results) No results for input(s): "PROBNP" in the last 8760 hours.  Lipid Profile: No results for input(s): "CHOL", "HDL", "LDLCALC", "TRIG", "CHOLHDL", "LDLDIRECT" in the last 72 hours.  Thyroid Function Tests: No results for input(s): "TSH", "T4TOTAL", "FREET4", "T3FREE", "THYROIDAB" in the last 72 hours.  Anemia Panel: No results for input(s): "VITAMINB12", "FOLATE", "FERRITIN", "TIBC", "IRON", "RETICCTPCT" in the last 72 hours.  Urine analysis:    Component Value Date/Time   COLORURINE YELLOW 05/11/2020 2100   APPEARANCEUR HAZY (A) 05/11/2020 2100   LABSPEC 1.012 05/11/2020 2100   PHURINE 5.0 05/11/2020 2100   GLUCOSEU NEGATIVE 05/11/2020 2100   HGBUR NEGATIVE 05/11/2020 2100   BILIRUBINUR NEGATIVE 05/11/2020 2100   KETONESUR NEGATIVE 05/11/2020 2100   PROTEINUR 30 (A) 05/11/2020 2100   UROBILINOGEN 0.2 03/04/2015 1835   NITRITE NEGATIVE 05/11/2020 2100   LEUKOCYTESUR MODERATE (A) 05/11/2020 2100    Sepsis Labs: Lactic Acid, Venous    Component Value Date/Time   LATICACIDVEN 1.1 04/12/2022 9432    MICROBIOLOGY: Recent Results (from the past 240 hour(s))  Culture, blood (routine x 2)     Status: None (Preliminary result)   Collection Time: 04/12/22  5:31 AM   Specimen: BLOOD  Result Value Ref Range Status   Specimen Description BLOOD CENTRAL LINE  Final   Special Requests   Final    BOTTLES DRAWN AEROBIC AND ANAEROBIC Blood Culture adequate volume   Culture   Final    NO GROWTH 2  DAYS Performed at [facility], [address] [place], [place] [address]    Report Status PENDING  Incomplete  SARS Coronavirus 2 by RT PCR (hospital order, performed in [place] hospital lab) *cepheid single result test* Anterior Nasal Swab     Status: None   Collection Time: 04/12/22  6:23 AM   Specimen: Anterior Nasal Swab  Result Value Ref Range Status   SARS Coronavirus 2 by RT PCR NEGATIVE NEGATIVE Final    Comment: (NOTE) SARS-CoV-2 target nucleic acids are NOT DETECTED.  The SARS-CoV-2 RNA is generally detectable in upper and lower respiratory specimens during the acute phase of infection. The lowest concentration of SARS-CoV-2 viral copies this assay can detect is 250 copies / mL. A negative result does not preclude SARS-CoV-2 infection and should not be used as the sole basis for treatment or other patient management decisions.  A negative result may occur with improper specimen collection / handling, submission of specimen other than nasopharyngeal swab, presence of viral mutation(s) within the areas targeted by this assay, and inadequate number of viral copies (<250 copies / mL). A negative result must be combined with clinical observations, patient history, and epidemiological information.  Fact Sheet for Patients:   https://www.patel.info/  Fact Sheet for Healthcare Providers: https://hall.com/  This test is not yet approved or  cleared by the [place] FDA and has been authorized for detection and/or diagnosis of SARS-CoV-2 by FDA under an Emergency Use Authorization (EUA).  This EUA will remain in effect (meaning this test can be used) for the duration of the COVID-19 declaration under Section 564(b)(1) of the Act, 21 U.S.C. section  360bbb-3(b)(1), unless the authorization is terminated or revoked sooner.  Performed at [facility], [address], [place], [place] [address]   Culture, blood (routine x 2)      Status: None (Preliminary result)   Collection Time: 04/12/22  6:32 AM   Specimen: BLOOD  Result Value Ref Range Status   Specimen Description BLOOD LEFT ARM  Final   Special Requests   Final    BOTTLES DRAWN AEROBIC ONLY Blood Culture adequate volume   Culture   Final    NO GROWTH 2 DAYS Performed at [facility], [address] [place], [place] [address]    Report Status PENDING  Incomplete    RADIOLOGY STUDIES/RESULTS: US RENAL  Result Date: 04/14/2022 CLINICAL DATA:  AKI EXAM: RENAL / URINARY TRACT ULTRASOUND COMPLETE COMPARISON:  CT abdomen and pelvis without contrast May 11, 2020, abdominal ultrasound May 12, 2020, MRCP May 11, 2020 FINDINGS: Right Kidney: Renal measurements: 11.0 x 5.6 x 4.8 cm = volume: 154.5 mL. Slightly increased cortical echogenicity. No hydronephrosis. There is a 1.5 x 1.3 x 1.4 cm anechoic mass in the interpolar right kidney, without internal blood flow on color Doppler examination. This corresponds with the T2 hyperintense mass on prior MRCP and is favored to represent a benign cyst. Left Kidney: Renal measurements: 11.6 x 5.8 x 5.4 cm = volume: 190 mL. Slightly increased cortical echogenicity. No hydronephrosis. There is a 2.5 x 2.3 x 2.5 cm anechoic mass in the interpolar left kidney, without internal blood flow on color Doppler examination. This corresponds with a T2 hyperintense mass on prior MRCP and is favored to represent a benign cyst. Bladder: Appears normal for degree of bladder distention. Other: None. IMPRESSION: 1. No nephrolithiasis or hydronephrosis. 2. Slightly increased bilateral cortical echogenicity, which can be seen in the setting of medical renal disease. Electronically Signed   By: Beryle Flock M.D.   On: 04/14/2022 08:31     LOS: 2 days   Oren Binet, MD  [facility]    To contact the attending provider between 7A-7P or the covering provider during after hours 7P-7A, please log into the web site [email or web address]  and access using universal [place] password for that web site. If you do not have the password, please call the hospital operator.  04/14/2022, 1:29 PM

## 2022-04-14 NOTE — Progress Notes (Signed)
Patient ID: Eric Scott, male   DOB: 1950-08-06, 71 y.o.   MRN: [id number] Patient is seen for follow-up for left lateral ankle wound.  There was a wound VAC pump dressing attached the dressing had been pulled off of the wound VAC pump was turned off.  Examination the wound shows healthy granulation tissue there is a small amount of serosanguineous drainage but there is no surrounding cellulitis.  Patient is laying with his legs externally rotated with full weight on the wound.  Patient will need foam boots to unload pressure from the ankle and dry dressing changes daily.  I will follow-up as an outpatient.

## 2022-04-15 DIAGNOSIS — A419 Sepsis, unspecified organism: Secondary | ICD-10-CM | POA: Diagnosis not present

## 2022-04-15 DIAGNOSIS — M86172 Other acute osteomyelitis, left ankle and foot: Secondary | ICD-10-CM | POA: Diagnosis not present

## 2022-04-15 DIAGNOSIS — N184 Chronic kidney disease, stage 4 (severe): Secondary | ICD-10-CM | POA: Diagnosis not present

## 2022-04-15 LAB — BASIC METABOLIC PANEL
Anion gap: 12 (ref 5–15)
BUN: 109 mg/dL — ABNORMAL HIGH (ref 8–23)
CO2: 31 mmol/L (ref 22–32)
Calcium: 8.2 mg/dL — ABNORMAL LOW (ref 8.9–10.3)
Chloride: 97 mmol/L — ABNORMAL LOW (ref 98–111)
Creatinine, Ser: 3.36 mg/dL — ABNORMAL HIGH (ref 0.61–1.24)
GFR, Estimated: 19 mL/min — ABNORMAL LOW (ref >60)
Glucose, Bld: 210 mg/dL — ABNORMAL HIGH (ref 70–99)
Potassium: 4.2 mmol/L (ref 3.5–5.1)
Sodium: 140 mmol/L (ref 135–145)

## 2022-04-15 LAB — CBC
HCT: 26.9 % — ABNORMAL LOW (ref 39.0–52.0)
Hemoglobin: 8.7 g/dL — ABNORMAL LOW (ref 13.0–17.0)
MCH: 29.8 pg (ref 26.0–34.0)
MCHC: 32.3 g/dL (ref 30.0–36.0)
MCV: 92.1 fL (ref 80.0–100.0)
Platelets: 259 10*3/uL (ref 150–400)
RBC: 2.92 MIL/uL — ABNORMAL LOW (ref 4.22–5.81)
RDW: 14.4 % (ref 11.5–15.5)
WBC: 12.6 10*3/uL — ABNORMAL HIGH (ref 4.0–10.5)
nRBC: 0 % (ref 0.0–0.2)

## 2022-04-15 LAB — GLUCOSE, CAPILLARY
Glucose-Capillary: 193 mg/dL — ABNORMAL HIGH (ref 70–99)
Glucose-Capillary: 276 mg/dL — ABNORMAL HIGH (ref 70–99)
Glucose-Capillary: 167 mg/dL — ABNORMAL HIGH (ref 70–99)
Glucose-Capillary: 189 mg/dL — ABNORMAL HIGH (ref 70–99)
Glucose-Capillary: 213 mg/dL — ABNORMAL HIGH (ref 70–99)

## 2022-04-15 MED ORDER — SODIUM ZIRCONIUM CYCLOSILICATE 10 G PO PACK
10.0000 g | PACK | Freq: Every day | ORAL | Status: DC
  Administered 2022-04-15 – 2022-04-17 (×3): 10 g via ORAL
  Filled 2022-04-15 (×3): qty 1

## 2022-04-15 MED ORDER — PREDNISONE 20 MG PO TABS
20.0000 mg | ORAL_TABLET | Freq: Every day | ORAL | Status: AC
  Administered 2022-04-16 – 2022-04-17 (×2): 20 mg via ORAL
  Filled 2022-04-15 (×2): qty 1

## 2022-04-15 MED ORDER — SODIUM ZIRCONIUM CYCLOSILICATE 10 G PO PACK: 10.0000 g | PACK | Freq: Every day | ORAL | Status: DC

## 2022-04-15 MED ORDER — IPRATROPIUM BROMIDE 0.02 % IN SOLN
0.5000 mg | Freq: Three times a day (TID) | RESPIRATORY_TRACT | Status: DC
  Administered 2022-04-15 (×2): 0.5 mg via RESPIRATORY_TRACT
  Filled 2022-04-15 (×2): qty 2.5

## 2022-04-15 MED ORDER — LEVALBUTEROL HCL 0.63 MG/3ML IN NEBU
0.6300 mg | INHALATION_SOLUTION | Freq: Three times a day (TID) | RESPIRATORY_TRACT | Status: DC
  Administered 2022-04-15 (×2): 0.63 mg via RESPIRATORY_TRACT
  Filled 2022-04-15 (×2): qty 3

## 2022-04-15 NOTE — Plan of Care (Signed)

## 2022-04-15 NOTE — Progress Notes (Signed)
PROGRESS NOTE        PATIENT DETAILS Name: Eric Scott Age: 71 y.o. Sex: male Date of Birth: [birth year] Admit Date: 04/12/2022 Admitting Physician Deatra James, MD HWT:UUEKC, Fara Olden, MD  Brief Summary: Patient is a 71 y.o.  male with history of COPD on 3-4 L of oxygen at home, HFpEF, DM-2, CKD stage IV-who was recently hospitalized (10/25-11/8) for left ankle osteomyelitis-s/p hardware removal and debridement (polymicrobial infection with Acinetobacter/Morganella/Enterococcus faecalis/Staph aureus)-hospital course was complicated by worsening respiratory failure requiring BiPAP in the setting of volume overload/COPD exacerbation-patient was stabilized-and subsequently discharged to SNF on 11/8, unfortunately patient returned to [facility] ED on 11/9 with worsening hypoxemia-and was found to have new left upper lobe PNA and admitted to the hospitalist service.  See below for further details.   Significant events: 10/25-11/8>> hospitalization for left ankle osteomyelitis-s/p hardware removal-developed worsening hypoxemia due to COPD exacerbation/CHF and A-fib RVR.  Stabilized-ultimately discharged to SNF. 11/9>> presented to [facility] ED from SNF for worsening shortness of breath-CXR with left upper lobe PNA.  Transferred to [facility].  Significant studies: 11/10>> CT chest: Large left upper lobe opacity-likely PNA.  Significant microbiology data: 10/25>> left ankle wound: Morganella/Acinetobacter/Enterococcus faecalis/MRSA/VRE 11/9>> COVID PCR: Negative 11/9>> blood culture: No growth  Procedures: 10/25>> left ankle hardware removal by Dr. Sharol Given.  Consults: None  Subjective: Feels better-on 3-4 L of oxygen (baseline)  Objective: Vitals: Blood pressure (!) 140/86, pulse 65, temperature 98 F (36.7 C), temperature source Oral, resp. rate 16, height 6' (1.829 m), weight 128.8 kg, SpO2 98 %.   Exam: Gen Exam:Alert awake-not in any distress HEENT:atraumatic,  normocephalic Chest: B/L clear to auscultation anteriorly CVS:S1S2 regular Abdomen:soft non tender, non distended Extremities: Trace edema Neurology: Non focal Skin: no rash  Pertinent Labs/Radiology:    Latest Ref Rng & Units 04/15/2022    4:17 AM 04/14/2022    1:29 AM 04/13/2022    2:33 AM  CBC  WBC 4.0 - 10.5 K/uL 12.6  11.7  11.2   Hemoglobin 13.0 - 17.0 g/dL 8.7  9.2  9.2   Hematocrit 39.0 - 52.0 % 26.9  27.9  28.2   Platelets 150 - 400 K/uL 259  265  270     Lab Results  Component Value Date   NA 140 04/15/2022   K 4.2 04/15/2022   CL 97 (L) 04/15/2022   CO2 31 04/15/2022      Assessment/Plan: Acute on chronic hypoxic respiratory failure 2/2 Left upper lobe PNA/COPD exacerbation Significant improvement in the past few days-sounds a whole lot better today-no rhonchi Remains on broad-spectrum antibiotics Continue tapering prednisone-on bronchodilators  Severe sepsis Due to left upper lobe PNA Sepsis physiology resolved On broad-spectrum antibiotics Cultures negative to date.  Polymicrobial left ankle osteomyelitis-s/p debridement/hardware removal by Dr. Sharol Given on 10/25 Wound VAC in place Appreciate Dr. Sharol Given input Wound VAC in place On broad-spectrum antimicrobial therapy-Zyvox/meropenem.  Per ID note-end date of antibiotics 12/6.  COPD exacerbation Significantly better Hardly any rhonchi Continue bronchodilators Prednisone for a few more days.  AKI on CKD stage IV AKI likely hemodynamically mediated Improving with supportive care  Creatinine slowly downtrending  Continue to hold diuretics-volume status remains relatively stable.  Frequent bladder scans to ensure no retention   Hyperkalemia In the setting of worsening renal function Resolved after Lokelma x3 doses on 11/11 Thankfully renal function improving-we will maintain  on daily dosing of Lokelma for a few more days till diuretics have been restarted.  Acute on chronic HFpEF Volume status  relatively stable-mild lower extremity edema continues  Since volume status remains stable-continue to hold diuretics and allow creatinine to downtrend further.  Paroxysmal atrial flutter In and out of A-fib/flutter Rate controlled with amiodarone/metoprolol On Eliquis Continue telemetry monitoring  Normocytic anemia Combination of CKD/acute illness No evidence of blood loss Follow CBC closely-on Eliquis.  HTN BP stable Continue amlodipine, metoprolol Follow/adjust  DM-2 (A1c 5.8 on 10/25) CBGs stable but fluctuating-prednisone being tapered down Continue Semglee 20 units daily and SSI   Recent Labs    04/14/22 2125 04/15/22 0604 04/15/22 0812  GLUCAP 341* 193* 276*     Gout Not in flare Continue colchicine  BPH Continue Flomax Frequent bladder scans to ensure no retention given fluctuating renal function.  Chronic peripheral neuropathy/chronic back pain Continue as needed narcotics/Neurontin  Mood disorder Stable-continue Prozac/Xanax  1.6 cm left thyroid nodule Incidental finding on CT chest Further work-up deferred to outpatient setting.  7 mm left lower lobe nodule Incidental finding on CT chest. Repeat CT chest 6-12 months.  4.3 cm ascending thoracic aneurysm Incidental finding on CT chest Annual imaging by CTA/MRA recommended.  Morbid Obesity: Estimated body mass index is 38.51 kg/m as calculated from the following:   Height as of this encounter: 6' (1.829 m).   Weight as of this encounter: 128.8 kg.   Code status:   Code Status: Full Code   DVT Prophylaxis: TED hose Start: 04/12/22 0815 SCDs Start: 04/12/22 0815 apixaban (ELIQUIS) tablet 5 mg    Family Communication:  Son-Quindarrius [phone]-on 11/10 left voicemail Daughter-[name]-on 11/11-left voicemail   Disposition Plan: Status is: Inpatient Remains inpatient appropriate because: Improving sepsis physiology/hypoxemia-not yet stable for discharge.   Planned  Discharge Destination:Skilled nursing facility likely early next week.   Diet: Diet Order             Diet heart healthy/carb modified Room service appropriate? Yes; Fluid consistency: Thin; Fluid restriction: 1500 mL Fluid  Diet effective now                     Antimicrobial agents: Anti-infectives (From admission, onward)    Start     Dose/Rate Route Frequency Ordered Stop   04/13/22 1600  linezolid (ZYVOX) tablet 600 mg        600 mg Oral Every 12 hours 04/13/22 1514     04/12/22 1000  meropenem (MERREM) injection 1 g  Status:  Discontinued       Note to Pharmacy: Indication:  Polymicrobial L-ankle osteo First Dose: Yes Last Day of Therapy:  05/09/22 Labs - Once weekly:  CBC/D and BMP, Labs - Every other week:  ESR and CRP Rate: 200 mL/hour     1 g Intravenous Every 12 hours 04/12/22 0824 04/12/22 0857   04/12/22 1000  linezolid (ZYVOX) IVPB 600 mg  Status:  Discontinued        600 mg 300 mL/hr over 60 Minutes Intravenous Every 12 hours 04/12/22 0834 04/13/22 1514   04/12/22 1000  meropenem (MERREM) 1 g in sodium chloride 0.9 % 100 mL IVPB        1 g 200 mL/hr over 30 Minutes Intravenous Every 12 hours 04/12/22 0857     04/12/22 0630  vancomycin (VANCOCIN) IVPB 1000 mg/200 mL premix        1,000 mg 200 mL/hr over 60 Minutes Intravenous  Once 04/12/22 0616  04/12/22 0859   04/12/22 0630  ceFEPIme (MAXIPIME) 2 g in sodium chloride 0.9 % 100 mL IVPB        2 g 200 mL/hr over 30 Minutes Intravenous  Once 04/12/22 0616 04/12/22 0757        MEDICATIONS: Scheduled Meds:  ALPRAZolam  0.5 mg Oral QHS   amiodarone  200 mg Oral BID   [START ON 04/19/2022] amiodarone  200 mg Oral Daily   amLODipine  10 mg Oral Daily   apixaban  5 mg Oral BID   arformoterol  15 mcg Nebulization BID   budesonide (PULMICORT) nebulizer solution  0.25 mg Nebulization BID   Chlorhexidine Gluconate Cloth  6 each Topical Daily   colchicine  0.3 mg Oral Daily   ferrous sulfate  325 mg Oral BID    FLUoxetine  40 mg Oral Daily   gabapentin  300 mg Oral QHS   HYDROcodone-acetaminophen  1 tablet Oral QID   insulin aspart  0-20 Units Subcutaneous TID AC & HS   insulin glargine-yfgn  20 Units Subcutaneous Daily   levalbuterol  0.63 mg Nebulization TID   And   ipratropium  0.5 mg Nebulization TID   linezolid  600 mg Oral Q12H   magnesium oxide  400 mg Oral Daily   metoprolol tartrate  25 mg Oral BID   polyethylene glycol  17 g Oral Daily   predniSONE  40 mg Oral Q breakfast   revefenacin  175 mcg Nebulization Daily   senna  2 tablet Oral BID   sodium chloride flush  3 mL Intravenous Q12H   sodium chloride flush  3 mL Intravenous Q12H   sodium zirconium cyclosilicate  10 g Oral Daily   tamsulosin  0.4 mg Oral Q supper   Continuous Infusions:  sodium chloride     meropenem (MERREM) IV 1 g (04/15/22 0940)   PRN Meds:.sodium chloride, acetaminophen **OR** acetaminophen, albuterol, bisacodyl, hydrALAZINE, HYDROmorphone (DILAUDID) injection, ondansetron **OR** ondansetron (ZOFRAN) IV, oxyCODONE, senna-docusate, sodium chloride flush, sodium chloride flush, sodium phosphate, traZODone   I have personally reviewed following labs and imaging studies  LABORATORY DATA: CBC: Recent Labs  Lab 04/11/22 0545 04/12/22 0531 04/13/22 0233 04/14/22 0129 04/15/22 0417  WBC 13.1* 20.1* 11.2* 11.7* 12.6*  NEUTROABS  --  15.2*  --   --   --   HGB 9.5* 10.4* 9.2* 9.2* 8.7*  HCT 29.5* 32.2* 28.2* 27.9* 26.9*  MCV 93.1 93.9 92.2 93.0 92.1  PLT 264 388 270 265 259     Basic Metabolic Panel: Recent Labs  Lab 04/09/22 0155 04/10/22 0445 04/11/22 0545 04/12/22 0531 04/12/22 0833 04/13/22 0233 04/14/22 0129 04/15/22 0417  NA 138   < > 137 139  --  136 138 140  K 5.0   < > 5.6* 4.3  --  4.6 5.7* 4.2  CL 102   < > 98 100  --  96* 98 97*  CO2 24   < > 27 28  --  _0 GLUCOSE 293*   < > 333* 206*  --  256* 172* 210*  BUN 81*   < > 90* 92*  --  99* 108* 109*  CREATININE 3.03*   <  > 3.12* 3.19*  --  3.33* 3.70* 3.36*  CALCIUM 8.6*   < > 8.6* 8.4*  --  8.5* 8.6* 8.2*  MG 1.9  --   --   --  2.0  --   --   --  PHOS  --   --   --   --  4.6  --   --   --    < > = values in this interval not displayed.     GFR: Estimated Creatinine Clearance: 28 mL/min (A) (by C-G formula based on SCr of 3.36 mg/dL (H)).  Liver Function Tests: Recent Labs  Lab 04/12/22 0531  AST 12*  ALT 12  ALKPHOS 62  BILITOT 0.7  PROT 6.7  ALBUMIN 2.9*    No results for input(s): "LIPASE", "AMYLASE" in the last 168 hours. No results for input(s): "AMMONIA" in the last 168 hours.  Coagulation Profile: Recent Labs  Lab 04/12/22 0924 04/13/22 0233  INR 1.4* 1.4*     Cardiac Enzymes: No results for input(s): "CKTOTAL", "CKMB", "CKMBINDEX", "TROPONINI" in the last 168 hours.  BNP (last 3 results) No results for input(s): "PROBNP" in the last 8760 hours.  Lipid Profile: No results for input(s): "CHOL", "HDL", "LDLCALC", "TRIG", "CHOLHDL", "LDLDIRECT" in the last 72 hours.  Thyroid Function Tests: No results for input(s): "TSH", "T4TOTAL", "FREET4", "T3FREE", "THYROIDAB" in the last 72 hours.  Anemia Panel: No results for input(s): "VITAMINB12", "FOLATE", "FERRITIN", "TIBC", "IRON", "RETICCTPCT" in the last 72 hours.  Urine analysis:    Component Value Date/Time   COLORURINE YELLOW 05/11/2020 2100   APPEARANCEUR HAZY (A) 05/11/2020 2100   LABSPEC 1.012 05/11/2020 2100   PHURINE 5.0 05/11/2020 2100   GLUCOSEU NEGATIVE 05/11/2020 2100   HGBUR NEGATIVE 05/11/2020 2100   BILIRUBINUR NEGATIVE 05/11/2020 2100   KETONESUR NEGATIVE 05/11/2020 2100   PROTEINUR 30 (A) 05/11/2020 2100   UROBILINOGEN 0.2 03/04/2015 1835   NITRITE NEGATIVE 05/11/2020 2100   LEUKOCYTESUR MODERATE (A) 05/11/2020 2100    Sepsis Labs: Lactic Acid, Venous    Component Value Date/Time   LATICACIDVEN 1.1 04/12/2022 7544    MICROBIOLOGY: Recent Results (from the past 240 hour(s))  Culture, blood  (routine x 2)     Status: None (Preliminary result)   Collection Time: 04/12/22  5:31 AM   Specimen: BLOOD  Result Value Ref Range Status   Specimen Description BLOOD CENTRAL LINE  Final   Special Requests   Final    BOTTLES DRAWN AEROBIC AND ANAEROBIC Blood Culture adequate volume   Culture   Final    NO GROWTH 3 DAYS Performed at [facility], [address], [place], [place] [address]    Report Status PENDING  Incomplete  SARS Coronavirus 2 by RT PCR (hospital order, performed in [place] hospital lab) *cepheid single result test* Anterior Nasal Swab     Status: None   Collection Time: 04/12/22  6:23 AM   Specimen: Anterior Nasal Swab  Result Value Ref Range Status   SARS Coronavirus 2 by RT PCR NEGATIVE NEGATIVE Final    Comment: (NOTE) SARS-CoV-2 target nucleic acids are NOT DETECTED.  The SARS-CoV-2 RNA is generally detectable in upper and lower respiratory specimens during the acute phase of infection. The lowest concentration of SARS-CoV-2 viral copies this assay can detect is 250 copies / mL. A negative result does not preclude SARS-CoV-2 infection and should not be used as the sole basis for treatment or other patient management decisions.  A negative result may occur with improper specimen collection / handling, submission of specimen other than nasopharyngeal swab, presence of viral mutation(s) within the areas targeted by this assay, and inadequate number of viral copies (<250 copies / mL). A negative result must be combined with clinical observations, patient history, and  epidemiological information.  Fact Sheet for Patients:   https://www.patel.info/  Fact Sheet for Healthcare Providers: https://hall.com/  This test is not yet approved or  cleared by the [place] FDA and has been authorized for detection and/or diagnosis of SARS-CoV-2 by FDA under an Emergency Use Authorization (EUA).  This EUA will remain in  effect (meaning this test can be used) for the duration of the COVID-19 declaration under Section 564(b)(1) of the Act, 21 U.S.C. section 360bbb-3(b)(1), unless the authorization is terminated or revoked sooner.  Performed at [facility], [address], [place], [place] [address]   Culture, blood (routine x 2)     Status: None (Preliminary result)   Collection Time: 04/12/22  6:32 AM   Specimen: BLOOD  Result Value Ref Range Status   Specimen Description BLOOD LEFT ARM  Final   Special Requests   Final    BOTTLES DRAWN AEROBIC ONLY Blood Culture adequate volume   Culture   Final    NO GROWTH 3 DAYS Performed at [facility], [address], [place], [place] [address]    Report Status PENDING  Incomplete    RADIOLOGY STUDIES/RESULTS: US RENAL  Result Date: 04/14/2022 CLINICAL DATA:  AKI EXAM: RENAL / URINARY TRACT ULTRASOUND COMPLETE COMPARISON:  CT abdomen and pelvis without contrast May 11, 2020, abdominal ultrasound May 12, 2020, MRCP May 11, 2020 FINDINGS: Right Kidney: Renal measurements: 11.0 x 5.6 x 4.8 cm = volume: 154.5 mL. Slightly increased cortical echogenicity. No hydronephrosis. There is a 1.5 x 1.3 x 1.4 cm anechoic mass in the interpolar right kidney, without internal blood flow on color Doppler examination. This corresponds with the T2 hyperintense mass on prior MRCP and is favored to represent a benign cyst. Left Kidney: Renal measurements: 11.6 x 5.8 x 5.4 cm = volume: 190 mL. Slightly increased cortical echogenicity. No hydronephrosis. There is a 2.5 x 2.3 x 2.5 cm anechoic mass in the interpolar left kidney, without internal blood flow on color Doppler examination. This corresponds with a T2 hyperintense mass on prior MRCP and is favored to represent a benign cyst. Bladder: Appears normal for degree of bladder distention. Other: None. IMPRESSION: 1. No nephrolithiasis or hydronephrosis. 2. Slightly increased bilateral cortical echogenicity, which can be seen  in the setting of medical renal disease. Electronically Signed   By: Beryle Flock M.D.   On: 04/14/2022 08:31     LOS: 3 days   Oren Binet, MD  [facility]    To contact the attending provider between 7A-7P or the covering provider during after hours 7P-7A, please log into the web site [email or web address] and access using universal [place] password for that web site. If you do not have the password, please call the hospital operator.  04/15/2022, 10:36 AM

## 2022-04-15 NOTE — Progress Notes (Signed)
Pt has PRN bipap orders, no distress noted at this time. 

## 2022-04-16 ENCOUNTER — Encounter: Payer: Medicare (Managed Care) | Admitting: Orthopedic Surgery

## 2022-04-16 DIAGNOSIS — A419 Sepsis, unspecified organism: Secondary | ICD-10-CM | POA: Diagnosis not present

## 2022-04-16 DIAGNOSIS — R6520 Severe sepsis without septic shock: Secondary | ICD-10-CM | POA: Diagnosis not present

## 2022-04-16 DIAGNOSIS — J9600 Acute respiratory failure, unspecified whether with hypoxia or hypercapnia: Secondary | ICD-10-CM | POA: Diagnosis not present

## 2022-04-16 DIAGNOSIS — N184 Chronic kidney disease, stage 4 (severe): Secondary | ICD-10-CM | POA: Diagnosis not present

## 2022-04-16 DIAGNOSIS — M86172 Other acute osteomyelitis, left ankle and foot: Secondary | ICD-10-CM | POA: Diagnosis not present

## 2022-04-16 LAB — GLUCOSE, CAPILLARY
Glucose-Capillary: 173 mg/dL — ABNORMAL HIGH (ref 70–99)
Glucose-Capillary: 201 mg/dL — ABNORMAL HIGH (ref 70–99)
Glucose-Capillary: 232 mg/dL — ABNORMAL HIGH (ref 70–99)
Glucose-Capillary: 285 mg/dL — ABNORMAL HIGH (ref 70–99)
Glucose-Capillary: 249 mg/dL — ABNORMAL HIGH (ref 70–99)
Glucose-Capillary: 197 mg/dL — ABNORMAL HIGH (ref 70–99)

## 2022-04-16 LAB — CBC
HCT: 25.8 % — ABNORMAL LOW (ref 39.0–52.0)
Hemoglobin: 8.5 g/dL — ABNORMAL LOW (ref 13.0–17.0)
MCH: 30.9 pg (ref 26.0–34.0)
MCHC: 32.9 g/dL (ref 30.0–36.0)
MCV: 93.8 fL (ref 80.0–100.0)
Platelets: 243 10*3/uL (ref 150–400)
RBC: 2.75 MIL/uL — ABNORMAL LOW (ref 4.22–5.81)
RDW: 14.6 % (ref 11.5–15.5)
WBC: 11.9 10*3/uL — ABNORMAL HIGH (ref 4.0–10.5)
nRBC: 0 % (ref 0.0–0.2)

## 2022-04-16 LAB — BASIC METABOLIC PANEL
Anion gap: 11 (ref 5–15)
BUN: 107 mg/dL — ABNORMAL HIGH (ref 8–23)
CO2: 31 mmol/L (ref 22–32)
Calcium: 8.3 mg/dL — ABNORMAL LOW (ref 8.9–10.3)
Chloride: 97 mmol/L — ABNORMAL LOW (ref 98–111)
Creatinine, Ser: 3.45 mg/dL — ABNORMAL HIGH (ref 0.61–1.24)
GFR, Estimated: 18 mL/min — ABNORMAL LOW (ref >60)
Glucose, Bld: 221 mg/dL — ABNORMAL HIGH (ref 70–99)
Potassium: 4.9 mmol/L (ref 3.5–5.1)
Sodium: 139 mmol/L (ref 135–145)

## 2022-04-16 NOTE — Progress Notes (Signed)
Speech Language Pathology Treatment: Dysphagia  Patient Details Name: Eric Scott MRN: [id number] DOB: 1951-02-25 Today's Date: 04/16/2022 Time: 3845-3646 SLP Time Calculation (min) (ACUTE ONLY): 17 min  Assessment / Plan / Recommendation Clinical Impression  Pt seen for dysphagia tx with snack with prolonged mastication with regular consistency and fatigue noted d/t respiratory status with breathing/swallowing reciprocity discussed paired with small bites/sips, slow rate and effortful swallow to clear pharynx.  Pt demonstrated a delay in the initiation of the swallow, but no overt s/s of aspiration during PO intake.  Pt denies dysphagia during meals, but ST to offer strategies/precautions during meal consumption re: rest breaks, implementation of swallowing strategies during meal and determination re: objective assessment prior to d/c to SNF d/t recent deconditioning/new dx of PNA to r/o potential silent aspiration.     HPI HPI: Patient is a 71 y.o. male with history of COPD on 3-4 L of oxygen at home, HFpEF, DM-2, CKD stage IV-who was recently hospitalized (10/25-11/8) for left ankle osteomyelitis-s/p hardware removal and debridement (polymicrobial infection with Acinetobacter/Morganella/Enterococcus faecalis/Staph aureus)-hospital course was complicated by worsening respiratory failure requiring BiPAP in the setting of volume overload/COPD exacerbation-patient was stabilized-and subsequently discharged to SNF on 11/8,. Pt returned to [facility] ED on 11/9 with worsening hypoxemia-and was found to have new left upper lobe PNA; ST f/u for dysphagia tx.      SLP Plan  Continue with current plan of care      Recommendations for follow up therapy are one component of a multi-disciplinary discharge planning process, led by the attending physician.  Recommendations may be updated based on patient status, additional functional criteria and insurance authorization.    Recommendations  Diet  recommendations: Regular Liquids provided via: Straw;Cup Medication Administration: Whole meds with puree Supervision: Patient able to self feed;Staff to assist with self feeding;Intermittent supervision to cue for compensatory strategies Compensations: Slow rate;Small sips/bites;Follow solids with liquid;Effortful swallow Postural Changes and/or Swallow Maneuvers: Seated upright 90 degrees                Oral Care Recommendations: Oral care BID Follow Up Recommendations: Skilled nursing-short term rehab (<3 hours/day) Assistance recommended at discharge: Set up Supervision/Assistance SLP Visit Diagnosis: Dysphagia, unspecified (R13.10) Plan: Continue with current plan of care           Eric Scott, M.S., CCC-SLP  04/16/2022, 12:59 PM

## 2022-04-16 NOTE — Plan of Care (Signed)
  Problem: Education: Goal: Knowledge of General Education information will improve Description Including pain rating scale, medication(s)/side effects and non-pharmacologic comfort measures Outcome: Progressing   

## 2022-04-16 NOTE — Consult Note (Signed)
[place] Nurse wound follow up Dr Sharol Given in to see left lateral ankle wound on 11/11.  Dressing had been removed.  Dr Sharol Given has implemented new orders for daily dry dressing that the bedside staff will manage.  Patient to follow up with Dr Sharol Given in outpatient setting .  No further WOC needs at this time.  Will not follow at this time.  Please re-consult if needed.  Estrellita Ludwig MSN, RN, FNP-BC CWON Wound, Ostomy, Continence Nurse [facility] [phone] Pager [phone]

## 2022-04-16 NOTE — Progress Notes (Signed)
Pt has PRN Bipap orders, no distress noted. Pt on 3L [place]

## 2022-04-16 NOTE — Progress Notes (Signed)
PROGRESS NOTE        PATIENT DETAILS Name: Eric Scott Age: 71 y.o. Sex: male Date of Birth: [birth year] Admit Date: 04/12/2022 Admitting Physician Deatra James, MD ZOX:[id number], Fara Olden, MD  Brief Summary: Patient is a 71 y.o.  male with history of COPD on 3-4 L of oxygen at home, HFpEF, DM-2, CKD stage IV-who was recently hospitalized (10/25-11/8) for left ankle osteomyelitis-s/p hardware removal and debridement (polymicrobial infection with Acinetobacter/Morganella/Enterococcus faecalis/Staph aureus)-hospital course was complicated by worsening respiratory failure requiring BiPAP in the setting of volume overload/COPD exacerbation-patient was stabilized-and subsequently discharged to SNF on 11/8, unfortunately patient returned to [facility] ED on 11/9 with worsening hypoxemia-and was found to have new left upper lobe PNA and admitted to the hospitalist service.  See below for further details.   Significant events: 10/25-11/8>> hospitalization for left ankle osteomyelitis-s/p hardware removal-developed worsening hypoxemia due to COPD exacerbation/CHF and A-fib RVR.  Stabilized-ultimately discharged to SNF. 11/9>> presented to [facility] ED from SNF for worsening shortness of breath-CXR with left upper lobe PNA.  Transferred to [facility].  Significant studies: 11/10>> CT chest: Large left upper lobe opacity-likely PNA.  Significant microbiology data: 10/25>> left ankle wound: Morganella/Acinetobacter/Enterococcus faecalis/MRSA/VRE 11/9>> COVID PCR: Negative 11/9>> blood culture: No growth  Procedures: 10/25>> left ankle hardware removal by Dr. Sharol Given.  Consults: None  Subjective: No major issues-lying comfortably in bed.  Objective: Vitals: Blood pressure (!) 145/82, pulse 66, temperature 98.1 F (36.7 C), temperature source Axillary, resp. rate 11, height 6' (1.829 m), weight 128.8 kg, SpO2 97 %.   Exam: Gen Exam:Alert awake-not in any distress HEENT:atraumatic,  normocephalic Chest: B/L clear to auscultation anteriorly CVS:S1S2 regular Abdomen:soft non tender, non distended Extremities:no edema Neurology: Non focal Skin: no rash  Pertinent Labs/Radiology:    Latest Ref Rng & Units 04/16/2022    3:39 AM 04/15/2022    4:17 AM 04/14/2022    1:29 AM  CBC  WBC 4.0 - 10.5 K/uL 11.9  12.6  11.7   Hemoglobin 13.0 - 17.0 g/dL 8.5  8.7  9.2   Hematocrit 39.0 - 52.0 % 25.8  26.9  27.9   Platelets 150 - 400 K/uL 243  259  265     Lab Results  Component Value Date   NA 139 04/16/2022   K 4.9 04/16/2022   CL 97 (L) 04/16/2022   CO2 31 04/16/2022      Assessment/Plan: Acute on chronic hypoxic respiratory failure 2/2 Left upper lobe PNA/COPD exacerbation Significant improvement with IV antibiotics-back on his usual regimen of 3-4 L  Discussed with ID MD-Dr. Margo Common more consistent with infection/aspiration pneumonia-ID feels that patient could have pneumonitis secondary to daptomycin.   ID will provide further recommendations regarding antibiotics.   Continue bronchodilators-prednisone being tapered down.    Severe sepsis Due to left upper lobe PNA Sepsis physiology resolved On broad-spectrum antibiotics Cultures negative to date.  Polymicrobial left ankle osteomyelitis-s/p debridement/hardware removal by Dr. Sharol Given on 10/25 Wound VAC in place Appreciate Dr. Sharol Given input Wound VAC in place On broad-spectrum antimicrobial therapy-Zyvox/meropenem.  Per ID note-end date of antibiotics 12/6.  COPD exacerbation Significantly better  Continue bronchodilators  Tapering prednisone   AKI on CKD stage IV AKI likely hemodynamically mediated Improving with supportive care  Creatinine seems to have plateaued Continue to hold diuretics-volume status remains relatively stable and patient having spontaneous auto diuresis with  significant urine output.   Frequent bladder scans to ensure no retention   Hyperkalemia In the setting of worsening  renal function Resolved after Lokelma x3 doses on 11/11 Continue Lokelma until he can be back on diuretics  Acute on chronic HFpEF Volume status relatively stable-mild lower extremity edema continues  Since volume status remains stable-continue to hold diuretics and allow creatinine to downtrend further.  Paroxysmal atrial flutter In and out of A-fib/flutter Rate controlled with amiodarone/metoprolol On Eliquis Continue telemetry monitoring  Normocytic anemia Combination of CKD/acute illness No evidence of blood loss Follow CBC closely-on Eliquis.  HTN BP stable Continue amlodipine, metoprolol Follow/adjust  DM-2 (A1c 5.8 on 10/25) CBGs stable but fluctuating-prednisone being tapered down Continue Semglee 20 units daily and SSI   Recent Labs    04/16/22 0654 04/16/22 0741 04/16/22 1151  GLUCAP 173* 201* 232*     Gout Not in flare Continue colchicine  BPH Continue Flomax Frequent bladder scans to ensure no retention given fluctuating renal function.  Chronic peripheral neuropathy/chronic back pain Continue as needed narcotics/Neurontin  Mood disorder Stable-continue Prozac/Xanax  1.6 cm left thyroid nodule Incidental finding on CT chest Further work-up deferred to outpatient setting.  7 mm left lower lobe nodule Incidental finding on CT chest. Repeat CT chest 6-12 months.  4.3 cm ascending thoracic aneurysm Incidental finding on CT chest Annual imaging by CTA/MRA recommended.  Morbid Obesity: Estimated body mass index is 38.51 kg/m as calculated from the following:   Height as of this encounter: 6' (1.829 m).   Weight as of this encounter: 128.8 kg.   Code status:   Code Status: Full Code   DVT Prophylaxis: TED hose Start: 04/12/22 0815 SCDs Start: 04/12/22 0815 apixaban (ELIQUIS) tablet 5 mg    Family Communication:  Son-Jarreau Lincks-[phone]-on 11/10 left voicemail Daughter-[name]-[phone]-on 11/11-left  voicemail   Disposition Plan: Status is: Inpatient Remains inpatient appropriate because: Improving sepsis physiology/hypoxemia-not yet stable for discharge.   Planned Discharge Destination:Skilled nursing facility likely early next week.   Diet: Diet Order             Diet heart healthy/carb modified Room service appropriate? Yes; Fluid consistency: Thin; Fluid restriction: 1500 mL Fluid  Diet effective now                     Antimicrobial agents: Anti-infectives (From admission, onward)    Start     Dose/Rate Route Frequency Ordered Stop   04/13/22 1600  linezolid (ZYVOX) tablet 600 mg        600 mg Oral Every 12 hours 04/13/22 1514     04/12/22 1000  meropenem (MERREM) injection 1 g  Status:  Discontinued       Note to Pharmacy: Indication:  Polymicrobial L-ankle osteo First Dose: Yes Last Day of Therapy:  05/09/22 Labs - Once weekly:  CBC/D and BMP, Labs - Every other week:  ESR and CRP Rate: 200 mL/hour     1 g Intravenous Every 12 hours 04/12/22 0824 04/12/22 0857   04/12/22 1000  linezolid (ZYVOX) IVPB 600 mg  Status:  Discontinued        600 mg 300 mL/hr over 60 Minutes Intravenous Every 12 hours 04/12/22 0834 04/13/22 1514   04/12/22 1000  meropenem (MERREM) 1 g in sodium chloride 0.9 % 100 mL IVPB        1 g 200 mL/hr over 30 Minutes Intravenous Every 12 hours 04/12/22 0857     04/12/22 0630  vancomycin (VANCOCIN) IVPB  1000 mg/200 mL premix        1,000 mg 200 mL/hr over 60 Minutes Intravenous  Once 04/12/22 0616 04/12/22 0859   04/12/22 0630  ceFEPIme (MAXIPIME) 2 g in sodium chloride 0.9 % 100 mL IVPB        2 g 200 mL/hr over 30 Minutes Intravenous  Once 04/12/22 0616 04/12/22 0757        MEDICATIONS: Scheduled Meds:  ALPRAZolam  0.5 mg Oral QHS   amiodarone  200 mg Oral BID   [START ON 04/19/2022] amiodarone  200 mg Oral Daily   amLODipine  10 mg Oral Daily   apixaban  5 mg Oral BID   arformoterol  15 mcg Nebulization BID   budesonide  (PULMICORT) nebulizer solution  0.25 mg Nebulization BID   Chlorhexidine Gluconate Cloth  6 each Topical Daily   colchicine  0.3 mg Oral Daily   ferrous sulfate  325 mg Oral BID   FLUoxetine  40 mg Oral Daily   gabapentin  300 mg Oral QHS   HYDROcodone-acetaminophen  1 tablet Oral QID   insulin aspart  0-20 Units Subcutaneous TID AC & HS   insulin glargine-yfgn  20 Units Subcutaneous Daily   linezolid  600 mg Oral Q12H   magnesium oxide  400 mg Oral Daily   metoprolol tartrate  25 mg Oral BID   polyethylene glycol  17 g Oral Daily   predniSONE  20 mg Oral Q breakfast   revefenacin  175 mcg Nebulization Daily   senna  2 tablet Oral BID   sodium chloride flush  3 mL Intravenous Q12H   sodium chloride flush  3 mL Intravenous Q12H   sodium zirconium cyclosilicate  10 g Oral Daily   tamsulosin  0.4 mg Oral Q supper   Continuous Infusions:  sodium chloride     meropenem (MERREM) IV 1 g (04/16/22 0910)   PRN Meds:.sodium chloride, acetaminophen **OR** acetaminophen, albuterol, bisacodyl, hydrALAZINE, HYDROmorphone (DILAUDID) injection, ondansetron **OR** ondansetron (ZOFRAN) IV, oxyCODONE, senna-docusate, sodium chloride flush, sodium chloride flush, sodium phosphate, traZODone   I have personally reviewed following labs and imaging studies  LABORATORY DATA: CBC: Recent Labs  Lab 04/12/22 0531 04/13/22 0233 04/14/22 0129 04/15/22 0417 04/16/22 0339  WBC 20.1* 11.2* 11.7* 12.6* 11.9*  NEUTROABS 15.2*  --   --   --   --   HGB 10.4* 9.2* 9.2* 8.7* 8.5*  HCT 32.2* 28.2* 27.9* 26.9* 25.8*  MCV 93.9 92.2 93.0 92.1 93.8  PLT 388 270 265 259 243     Basic Metabolic Panel: Recent Labs  Lab 04/12/22 0531 04/12/22 0833 04/13/22 0233 04/14/22 0129 04/15/22 0417 04/16/22 0339  NA 139  --  136 138 140 139  K 4.3  --  4.6 5.7* 4.2 4.9  CL 100  --  96* 98 97* 97*  CO2 28  --  _0 GLUCOSE 206*  --  256* 172* 210* 221*  BUN 92*  --  99* 108* 109* 107*  CREATININE 3.19*   --  3.33* 3.70* 3.36* 3.45*  CALCIUM 8.4*  --  8.5* 8.6* 8.2* 8.3*  MG  --  2.0  --   --   --   --   PHOS  --  4.6  --   --   --   --      GFR: Estimated Creatinine Clearance: 27.3 mL/min (A) (by C-G formula based on SCr of 3.45 mg/dL (H)).  Liver Function Tests: Recent Labs  Lab 04/12/22 0531  AST 12*  ALT 12  ALKPHOS 62  BILITOT 0.7  PROT 6.7  ALBUMIN 2.9*    No results for input(s): "LIPASE", "AMYLASE" in the last 168 hours. No results for input(s): "AMMONIA" in the last 168 hours.  Coagulation Profile: Recent Labs  Lab 04/12/22 0924 04/13/22 0233  INR 1.4* 1.4*     Cardiac Enzymes: No results for input(s): "CKTOTAL", "CKMB", "CKMBINDEX", "TROPONINI" in the last 168 hours.  BNP (last 3 results) No results for input(s): "PROBNP" in the last 8760 hours.  Lipid Profile: No results for input(s): "CHOL", "HDL", "LDLCALC", "TRIG", "CHOLHDL", "LDLDIRECT" in the last 72 hours.  Thyroid Function Tests: No results for input(s): "TSH", "T4TOTAL", "FREET4", "T3FREE", "THYROIDAB" in the last 72 hours.  Anemia Panel: No results for input(s): "VITAMINB12", "FOLATE", "FERRITIN", "TIBC", "IRON", "RETICCTPCT" in the last 72 hours.  Urine analysis:    Component Value Date/Time   COLORURINE YELLOW 05/11/2020 2100   APPEARANCEUR HAZY (A) 05/11/2020 2100   LABSPEC 1.012 05/11/2020 2100   PHURINE 5.0 05/11/2020 2100   GLUCOSEU NEGATIVE 05/11/2020 2100   HGBUR NEGATIVE 05/11/2020 2100   BILIRUBINUR NEGATIVE 05/11/2020 2100   KETONESUR NEGATIVE 05/11/2020 2100   PROTEINUR 30 (A) 05/11/2020 2100   UROBILINOGEN 0.2 03/04/2015 1835   NITRITE NEGATIVE 05/11/2020 2100   LEUKOCYTESUR MODERATE (A) 05/11/2020 2100    Sepsis Labs: Lactic Acid, Venous    Component Value Date/Time   LATICACIDVEN 1.1 04/12/2022 5631    MICROBIOLOGY: Recent Results (from the past 240 hour(s))  Culture, blood (routine x 2)     Status: None (Preliminary result)   Collection Time: 04/12/22  5:31  AM   Specimen: BLOOD  Result Value Ref Range Status   Specimen Description BLOOD CENTRAL LINE  Final   Special Requests   Final    BOTTLES DRAWN AEROBIC AND ANAEROBIC Blood Culture adequate volume   Culture   Final    NO GROWTH 4 DAYS Performed at [facility], [address] [place], [place] [address]    Report Status PENDING  Incomplete  SARS Coronavirus 2 by RT PCR (hospital order, performed in [place] hospital lab) *cepheid single result test* Anterior Nasal Swab     Status: None   Collection Time: 04/12/22  6:23 AM   Specimen: Anterior Nasal Swab  Result Value Ref Range Status   SARS Coronavirus 2 by RT PCR NEGATIVE NEGATIVE Final    Comment: (NOTE) SARS-CoV-2 target nucleic acids are NOT DETECTED.  The SARS-CoV-2 RNA is generally detectable in upper and lower respiratory specimens during the acute phase of infection. The lowest concentration of SARS-CoV-2 viral copies this assay can detect is 250 copies / mL. A negative result does not preclude SARS-CoV-2 infection and should not be used as the sole basis for treatment or other patient management decisions.  A negative result may occur with improper specimen collection / handling, submission of specimen other than nasopharyngeal swab, presence of viral mutation(s) within the areas targeted by this assay, and inadequate number of viral copies (<250 copies / mL). A negative result must be combined with clinical observations, patient history, and epidemiological information.  Fact Sheet for Patients:   https://www.patel.info/  Fact Sheet for Healthcare Providers: https://hall.com/  This test is not yet approved or  cleared by the [place] FDA and has been authorized for detection and/or diagnosis of SARS-CoV-2 by FDA under an Emergency Use Authorization (EUA).  This EUA will remain in effect (meaning this test can be  used) for the duration of the COVID-19 declaration under  Section 564(b)(1) of the Act, 21 U.S.C. section 360bbb-3(b)(1), unless the authorization is terminated or revoked sooner.  Performed at [facility], [address], [place], [place] [address]   Culture, blood (routine x 2)     Status: None (Preliminary result)   Collection Time: 04/12/22  6:32 AM   Specimen: BLOOD  Result Value Ref Range Status   Specimen Description BLOOD LEFT ARM  Final   Special Requests   Final    BOTTLES DRAWN AEROBIC ONLY Blood Culture adequate volume   Culture   Final    NO GROWTH 4 DAYS Performed at [facility], [address] [place], [place] [address]    Report Status PENDING  Incomplete    RADIOLOGY STUDIES/RESULTS: No results found.   LOS: 4 days   Oren Binet, MD  [facility]    To contact the attending provider between 7A-7P or the covering provider during after hours 7P-7A, please log into the web site [email or web address] and access using universal [place] password for that web site. If you do not have the password, please call the hospital operator.  04/16/2022, 11:58 AM

## 2022-04-16 NOTE — Progress Notes (Signed)
Physical Therapy Treatment Patient Details Name: Eric Scott MRN: [id number] DOB: [birth year] Today's Date: 04/16/2022   History of Present Illness 71 yo male presents to [facility] on [facility]/9 with chest pain and ShOB, workup for sepsis due to possible PNA. Recent admission 10/25 and d/c 11/8 for ulcer and infected hardware L ankle and underwent hardware removal and wound vac placement. PMH: GERD, chronic LBP, COPD, DM2, peripheral neuropathy, HTN, IDA, PVD, CKD, CVA .    PT Comments    Pt with seemingly impaired cognition vs previous session, cannot state reason for admission and has more difficulty following commands with increased time today. Pt with stool incontinence upon PT arrival, requires significant assist and bed mobility tasks to clean up given large stool. Pt very fatigued, agreeable to bed-level exercises after this. Pt reports not feeling well today, no specific complaint given. PT to continue to follow, plan remains appropriate.     Recommendations for follow up therapy are one component of a multi-disciplinary discharge planning process, led by the attending physician.  Recommendations may be updated based on patient status, additional functional criteria and insurance authorization.  Follow Up Recommendations  Skilled nursing-short term rehab (<3 hours/day) Can patient physically be transported by private vehicle: No   Assistance Recommended at Discharge Frequent or constant Supervision/Assistance  Patient can return home with the following A lot of help with bathing/dressing/bathroom;Assist for transportation;A lot of help with walking and/or transfers   Equipment Recommendations  None recommended by PT    Recommendations for Other Services       Precautions / Restrictions Precautions Precautions: Fall Precaution Comments: L wound vac, RLE neuropathy Restrictions Weight Bearing Restrictions: Yes LLE Weight Bearing: Weight bearing as tolerated     Mobility  Bed  Mobility Overal bed mobility: Needs Assistance Bed Mobility: Rolling Rolling: Mod assist, +2 for safety/equipment         General bed mobility comments: mod +2 for truncal translation, LE positioning. Multiple rolls each direction secondary to large stool, pt using bedrails to assist    Transfers                   General transfer comment: deferrred given pt fatigue    Ambulation/Gait                   Stairs             Wheelchair Mobility    Modified Rankin (Stroke Patients Only)       Balance Overall balance assessment: Needs assistance Sitting-balance support: Bilateral upper extremity supported, Feet unsupported Sitting balance-Leahy Scale: Fair                                      Cognition Arousal/Alertness: Awake/alert Behavior During Therapy: WFL for tasks assessed/performed Overall Cognitive Status: Impaired/Different from baseline Area of Impairment: Problem solving, Orientation, Attention, Following commands                 Orientation Level: Disoriented to, Situation Current Attention Level: Focused   Following Commands: Follows one step commands consistently, Follows one step commands with increased time     Problem Solving: Slow processing, Decreased initiation, Difficulty sequencing, Requires tactile cues, Requires verbal cues General Comments: pt cannot state why he was hospitalized, states "there's a lot wrong with me" to deflect. Pt with increased tremors and nervousness today, states he is having anxiety but states he  always has a tremor. Pt incontinent of stool during sessiom, requires increased time to follow cues for cleanup. At end of session when PT was adjusting pt's pillow, pt tries to hug PT and states "I love you" without warning        Exercises General Exercises - Lower Extremity Heel Slides: AAROM, Both, 15 reps, Supine Hip ABduction/ADduction: AAROM, Both, 15 reps, Supine    General  Comments General comments (skin integrity, edema, etc.): 3LO2, vss. Pt with redness and areas of bleeding on buttocks anticipate from loose stools and incontinence, PT brought RN to room to assess and dress sacral area.      Pertinent Vitals/Pain Pain Assessment Pain Assessment: Faces Faces Pain Scale: Hurts little more Pain Location: "all oveR" Pain Descriptors / Indicators: Sore, Discomfort Pain Intervention(s): Limited activity within patient's tolerance, Monitored during session, Repositioned    Home Living                          Prior Function            PT Goals (current goals can now be found in the care plan section) Acute Rehab PT Goals Patient Stated Goal: work on transitioning OOB PT Goal Formulation: With patient Time For Goal Achievement: 04/27/22 Potential to Achieve Goals: Good Progress towards PT goals: Progressing toward goals    Frequency    Min 2X/week      PT Plan Current plan remains appropriate    Co-evaluation              AM-PAC PT "6 Clicks" Mobility   Outcome Measure  Help needed turning from your back to your side while in a flat bed without using bedrails?: A Lot Help needed moving from lying on your back to sitting on the side of a flat bed without using bedrails?: A Lot Help needed moving to and from a bed to a chair (including a wheelchair)?: Total Help needed standing up from a chair using your arms (e.g., wheelchair or bedside chair)?: Total Help needed to walk in hospital room?: Total Help needed climbing 3-5 steps with a railing? : Total 6 Click Score: 8    End of Session Equipment Utilized During Treatment: Oxygen Activity Tolerance: Patient limited by fatigue Patient left: in bed;with call bell/phone within reach;with bed alarm set Nurse Communication: Mobility status PT Visit Diagnosis: Difficulty in walking, not elsewhere classified (R26.2);Muscle weakness (generalized) (M62.81);Pain Pain - Right/Left:  Left Pain - part of body: Ankle and joints of foot     Time: 1194-1740 PT Time Calculation (min) (ACUTE ONLY): 28 min  Charges:  $Therapeutic Activity: 8-22 mins                     Stacie Glaze, PT DPT Acute Rehabilitation Services Pager [phone]  Office [phone]   Lima E Ruffin Pyo 04/16/2022, 3:12 PM

## 2022-04-16 NOTE — Progress Notes (Signed)
Pt had 1000 + output on night shift last pm.  And, 800 already out today that hasn't been documented by tech as of yet.  Dr. Candiss Norse advised ok to skip bladder scan on dayshift today, however, have night nurse bladder scan pt on night shift.

## 2022-04-16 NOTE — Progress Notes (Signed)
[facility]  Date of Admission:  04/12/2022     Principal Problem:   Sepsis ([place]) Active Problems:   Type 2 diabetes mellitus with stage 4 chronic kidney disease, with K[place]ac[place]-term current use of insulin ([name])   HTN (hypertension)   Diabetic polyneuropathy associated with type 2 diabetes mellitus ([name])   PVD (peripheral vascular disease) ([name])   Generalized weakness   Critical lower limb ischemia ([name])   Arm DVT (deep venous thromboembolism), acute, right ([name])   GERD (gastroesophageal reflux disease)   Obesity, Class III, BMI 40-49.9 (morbid obesity) ([name])   Atrial flutter, paroxysmal ([name])   Chronic heart failure with preserved ejection fraction (HFpEF) ([name])   Acute on chronic respiratory failure with hypoxia ([name])   Osteomyelitis of ankle or foot, acute, left ([name])   Stage 4 chronic kidney disease ([place])          Assessment: 71 YM admitted with: #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  #COPD -Discharged to SNF on 11/8 following left leg HWR removal with polymicrobial Cx as above. Found to be hypoxic at SNF.  -CT showed left upper lobe large airspace opacity concerning for pneumonia, multiple small patches metastatic disease noted in left lower lobe right upper lobe concerning for multifocal pneumonia.   - Patient has been on daptomycin 10/27 which puts him right at the 2-week mark(usually presents 2-4 weeks of daptomycin).  I think daptomycin is in the differential as a cause of pneumonia with imaging showing b/l patchy opacity although there seems to be some consolidative changes in the left lobe noted as LUL airpace opacity.  Factors that favor dapto induced  eosinophilic pneumonia are peripheral eosinophilia at 10%(2.1K), CT changes, initial respiratory distress.  I think he deserves a  bronch in order to look for other etiologies as well.  In the meantime we will stop daptomycin and stop Recommendations: - Continue linezolid, Will plan on a dose of oritavancin prior to discharge - Continue meropenem - End date for 6 weeks of antibiotics as 12/6.  If this is a sign of dapto induced pneumonia would not favor doing linezolid for about a month.  We could do oritavancin on discharge followed by 2-week course linezolid, to complete the antibiotic course. - Engage pulmonology for possible bronc please obtain cell count, AFB, fungal, bacterial cultures, cytology.     Microbiology:   Antibiotics:   Daptomycin 10/27-11/8 Linezolid 11-9- Merrem 10/27-    SUBJECTIVE: Resting bed.reports breathing has improved.  Interval:  afebrile overnight. Wbc 11.9k  Review of Systems: Review of Systems  All other systems reviewed and are negative.    Scheduled Meds:  ALPRAZolam  0.5 mg Oral QHS   amiodarone  200 mg Oral BID   [START ON 04/19/2022] amiodarone  200 mg Oral Daily   amLODipine  10 mg Oral Daily   apixaban  5 mg Oral BID   arformoterol  15 mcg Nebulization BID   budesonide (PULMICORT) nebulizer solution  0.25 mg Nebulization BID   Chlorhexidine Gluconate Cloth  6 each Topical Daily   colchicine  0.3 mg Oral Daily   ferrous sulfate  325 mg Oral BID   FLUoxetine  40 mg Oral Daily   gabapentin  300 mg Oral QHS   HYDROcodone-acetaminophen  1 tablet Oral QID   insulin aspart  0-20 Units Subcutaneous TID AC & HS   insulin glargine-yfgn  20 Units Subcutaneous Daily   linezolid  600 mg Oral Q12H   magnesium oxide  400 mg Oral Daily   metoprolol tartrate  25 mg Oral BID   polyethylene glycol  17 g Oral Daily   predniSONE  20 mg Oral Q breakfast   revefenacin  175 mcg Nebulization Daily   senna  2 tablet Oral BID   sodium chloride flush  3 mL Intravenous Q12H   sodium chloride flush  3 mL Intravenous Q12H   sodium zirconium cyclosilicate  10 g Oral Daily   tamsulosin   0.4 mg Oral Q supper   Continuous Infusions:  sodium chloride     meropenem (MERREM) IV 1 g (04/16/22 0910)   PRN Meds:.sodium chloride, acetaminophen **OR** acetaminophen, albuterol, bisacodyl, hydrALAZINE, HYDROmorphone (DILAUDID) injection, ondansetron **OR** ondansetron (ZOFRAN) IV, oxyCODONE, senna-docusate, sodium chloride flush, sodium chloride flush, sodium phosphate, traZODone Allergies  Allergen Reactions   Baclofen     unknown   Blueberry Flavor Other (See Comments)    Unknown   Cucumber Extract Other (See Comments)    "Fells like I'm having a heart attack"   Flexeril [Cyclobenzaprine Hcl] Other (See Comments)    "whole body  Tremors" (05/27/2012)   Kiwi Extract Other (See Comments)    "feels like I'm having a heart attack"    OBJECTIVE: Vitals:   04/16/22 0755 04/16/22 0759 04/16/22 0800 04/16/22 1200  BP:   (!) 145/82 137/70  Pulse:   66   Resp:   11 11  Temp:      TempSrc:      SpO2: 97% 97%    Weight:      Height:       Body mass index is 38.51 kg/m.  Physical Exam Constitutional:      General: He is not in acute distress.    Appearance: He is normal weight. He is not toxic-appearing.  HENT:     Head: Normocephalic and atraumatic.     Right Ear: External ear normal.     Left Ear: External ear normal.     Nose: No congestion or rhinorrhea.     Mouth/Throat:     Mouth: Mucous membranes are moist.     Pharynx: Oropharynx is clear.  Eyes:     Extraocular Movements: Extraocular movements intact.     Conjunctiva/sclera: Conjunctivae normal.     Pupils: Pupils are equal, round, and reactive to light.  Cardiovascular:     Rate and Rhythm: Normal rate and regular rhythm.     Heart sounds: No murmur heard.    No friction rub. No gallop.  Pulmonary:     Effort: Pulmonary effort is normal.     Breath sounds: Normal breath sounds.  Abdominal:     General: Abdomen is flat. Bowel sounds are normal.     Palpations: Abdomen is soft.  Musculoskeletal:         General: No swelling. Normal range of motion.     Cervical back: Normal range of motion and neck supple.  Skin:    General: Skin is warm and dry.  Neurological:     General: No focal deficit present.     Mental Status: He is oriented to person, place, and time.  Psychiatric:        Mood and Affect: Mood normal.       Lab Results Lab Results  Component Value Date   WBC 11.9 (H) 04/16/2022  HGB 8.5 (L) 04/16/2022   HCT 25.8 (L) 04/16/2022   MCV 93.8 04/16/2022   PLT 243 04/16/2022    Lab Results  Component Value Date   CREATININE 3.45 (H) 04/16/2022   BUN 107 (H) 04/16/2022   NA 139 04/16/2022   K 4.9 04/16/2022   CL 97 (L) 04/16/2022   CO2 31 04/16/2022    Lab Results  Component Value Date   ALT 12 04/12/2022   AST 12 (L) 04/12/2022   ALKPHOS 62 04/12/2022   BILITOT 0.7 04/12/2022        Laurice Record, [facility] 04/16/2022, 4:29 PM

## 2022-04-16 NOTE — Progress Notes (Signed)
Patient on 3L [place] at this time with no distress noted. Vitals stable.

## 2022-04-16 NOTE — Progress Notes (Signed)
Orthopedic Tech Progress Note Patient Details:  Eric Scott July 23, 1950 [id number]  Called RN/LPN to let them know foam boots are in material   Patient ID: Eric Scott, male   DOB: 1951/04/24, 71 y.o.   MRN: [id number]  Eric Scott 04/16/2022, 10:47 AM

## 2022-04-16 NOTE — Inpatient Diabetes Management (Signed)
Inpatient Diabetes Program Recommendations  AACE/ADA: New Consensus Statement on Inpatient Glycemic Control (2015)  Target Ranges:  Prepandial:   less than 140 mg/dL      Peak postprandial:   less than 180 mg/dL (1-2 hours)      Critically ill patients:  140 - 180 mg/dL   Lab Results  Component Value Date   GLUCAP 232 (H) 04/16/2022   HGBA1C 5.8 (H) 03/28/2022    Review of Glycemic Control  Latest Reference Range & Units 04/15/22 08:12 04/15/22 11:57 04/15/22 15:40 04/15/22 21:02 04/16/22 06:54 04/16/22 07:41 04/16/22 11:51  Glucose-Capillary 70 - 99 mg/dL 276 (H) 167 (H) 189 (H) 213 (H) 173 (H) 201 (H) 232 (H)   Diabetes history: DM 2 Outpatient Diabetes medications: Lantus 22 units, Novolog 2-10 units tid Current orders for Inpatient glycemic control:  Semglee 20 units Daily Novolog 0-20 units tid + hs  PO prednisone 20 mg Daily A1c 5.8% on 10/25  Inpatient Diabetes Program Recommendations:    -  Increase Semglee to 22 units (home dose) -  Add Novolog 3 units tid meal coverage  Thanks,  Tama Headings RN, MSN, BC-ADM Inpatient Diabetes Coordinator Team Pager [phone] (8a-5p)

## 2022-04-17 ENCOUNTER — Ambulatory Visit: Payer: Medicare (Managed Care) | Admitting: Physician Assistant

## 2022-04-17 DIAGNOSIS — K219 Gastro-esophageal reflux disease without esophagitis: Secondary | ICD-10-CM | POA: Diagnosis not present

## 2022-04-17 DIAGNOSIS — A419 Sepsis, unspecified organism: Secondary | ICD-10-CM | POA: Diagnosis not present

## 2022-04-17 DIAGNOSIS — N184 Chronic kidney disease, stage 4 (severe): Secondary | ICD-10-CM | POA: Diagnosis not present

## 2022-04-17 DIAGNOSIS — J9621 Acute and chronic respiratory failure with hypoxia: Secondary | ICD-10-CM

## 2022-04-17 LAB — CBC
HCT: 27.1 % — ABNORMAL LOW (ref 39.0–52.0)
Hemoglobin: 8.5 g/dL — ABNORMAL LOW (ref 13.0–17.0)
MCH: 30 pg (ref 26.0–34.0)
MCHC: 31.4 g/dL (ref 30.0–36.0)
MCV: 95.8 fL (ref 80.0–100.0)
Platelets: 223 10*3/uL (ref 150–400)
RBC: 2.83 MIL/uL — ABNORMAL LOW (ref 4.22–5.81)
RDW: 14.6 % (ref 11.5–15.5)
WBC: 12.8 10*3/uL — ABNORMAL HIGH (ref 4.0–10.5)
nRBC: 0 % (ref 0.0–0.2)

## 2022-04-17 LAB — CULTURE, BLOOD (ROUTINE X 2)
Culture: NO GROWTH
Culture: NO GROWTH
Special Requests: ADEQUATE
Special Requests: ADEQUATE

## 2022-04-17 LAB — BASIC METABOLIC PANEL
Anion gap: 9 (ref 5–15)
BUN: 105 mg/dL — ABNORMAL HIGH (ref 8–23)
CO2: 32 mmol/L (ref 22–32)
Calcium: 8.4 mg/dL — ABNORMAL LOW (ref 8.9–10.3)
Chloride: 100 mmol/L (ref 98–111)
Creatinine, Ser: 3.34 mg/dL — ABNORMAL HIGH (ref 0.61–1.24)
GFR, Estimated: 19 mL/min — ABNORMAL LOW (ref >60)
Glucose, Bld: 156 mg/dL — ABNORMAL HIGH (ref 70–99)
Potassium: 5 mmol/L (ref 3.5–5.1)
Sodium: 141 mmol/L (ref 135–145)

## 2022-04-17 LAB — GLUCOSE, CAPILLARY
Glucose-Capillary: 138 mg/dL — ABNORMAL HIGH (ref 70–99)
Glucose-Capillary: 145 mg/dL — ABNORMAL HIGH (ref 70–99)
Glucose-Capillary: 156 mg/dL — ABNORMAL HIGH (ref 70–99)
Glucose-Capillary: 323 mg/dL — ABNORMAL HIGH (ref 70–99)
Glucose-Capillary: 189 mg/dL — ABNORMAL HIGH (ref 70–99)

## 2022-04-17 MED ORDER — ORITAVANCIN DIPHOSPHATE 400 MG IV SOLR
1200.0000 mg | Freq: Once | INTRAVENOUS | Status: AC
  Administered 2022-04-17: 1200 mg via INTRAVENOUS
  Filled 2022-04-17: qty 120

## 2022-04-17 MED ORDER — LINEZOLID 600 MG PO TABS: 600.0000 mg | ORAL_TABLET | Freq: Two times a day (BID) | ORAL | Status: DC

## 2022-04-17 NOTE — Progress Notes (Signed)
Notified Dr. Sloan Leiter that bladder scan volume was 384 at 1839.  Dr. Sloan Leiter advised to encourage patient to void and to re-scan in two hours. Will advise night shift RN.

## 2022-04-17 NOTE — Progress Notes (Signed)
PROGRESS NOTE        PATIENT DETAILS Name: Eric Scott Age: 71 y.o. Sex: male Date of Birth: 01/08/1951 Admit Date: 04/12/2022 Admitting Physician Deatra James, MD ONG:EXBMW, Fara Olden, MD  Brief Summary: Patient is a 71 y.o.  male with history of COPD on 3-4 L of oxygen at home, HFpEF, DM-2, CKD stage IV-who was recently hospitalized (10/25-11/8) for left ankle osteomyelitis-s/p hardware removal and debridement (polymicrobial infection with Acinetobacter/Morganella/Enterococcus faecalis/Staph aureus)-hospital course was complicated by worsening respiratory failure requiring BiPAP in the setting of volume overload/COPD exacerbation-patient was stabilized-and subsequently discharged to SNF on 11/8, unfortunately patient returned to [facility] ED on 11/9 with worsening hypoxemia-and was found to have new left upper lobe PNA and admitted to the hospitalist service.  See below for further details.   Significant events: 10/25-11/8>> hospitalization for left ankle osteomyelitis-s/p hardware removal-developed worsening hypoxemia due to COPD exacerbation/CHF and A-fib RVR.  Stabilized-ultimately discharged to SNF. 11/9>> presented to [facility] ED from SNF for worsening shortness of breath-CXR with left upper lobe PNA.  Transferred [facility].  Significant studies: 11/10>> CT chest: Large left upper lobe opacity-likely PNA.  Significant microbiology data: 10/25>> left ankle wound: Morganella/Acinetobacter/Enterococcus faecalis/MRSA/VRE 11/9>> COVID PCR: Negative 11/9>> blood culture: No growth  Procedures: 10/25>> left ankle hardware removal by Dr. Sharol Given.  Consults: None  Subjective: No major issues overnight.  Lying comfortably in bed.  Objective: Vitals: Blood pressure (!) 147/59, pulse 65, temperature 97.6 F (36.4 C), temperature source Axillary, resp. rate 14, height 6' (1.829 m), weight 128.8 kg, SpO2 98 %.   Exam: Gen Exam:Alert awake-not in any  distress HEENT:atraumatic, normocephalic Chest: B/L clear to auscultation anteriorly CVS:S1S2 regular Abdomen:soft non tender, non distended Extremities:no edema Neurology: Non focal Skin: no rash  Pertinent Labs/Radiology:    Latest Ref Rng & Units 04/17/2022    4:50 AM 04/16/2022    3:39 AM 04/15/2022    4:17 AM  CBC  WBC 4.0 - 10.5 K/uL 12.8  11.9  12.6   Hemoglobin 13.0 - 17.0 g/dL 8.5  8.5  8.7   Hematocrit 39.0 - 52.0 % 27.1  25.8  26.9   Platelets 150 - 400 K/uL 223  243  259     Lab Results  Component Value Date   NA 141 04/17/2022   K 5.0 04/17/2022   CL 100 04/17/2022   CO2 32 04/17/2022      Assessment/Plan: Acute on chronic hypoxic respiratory failure 2/2 Left upper lobe PNA/COPD exacerbation Significant improvement with IV antibiotics-back on his usual regimen of 3-4 L  Discussed with ID MD-Dr. Candiss Norse on 11/13-scenario more consistent with infection/aspirationpneumonia (predominantly left upper lobe infiltrate)-ID feels that patient could have pneumonitis secondary to daptomycin.   ID plans to give 1 dose of oritavancin today-and then continue with meropenem on discharge.    Severe sepsis Due to left upper lobe PNA Sepsis physiology resolved On broad-spectrum antibiotics Cultures negative to date.  Polymicrobial left ankle osteomyelitis-s/p debridement/hardware removal by Dr. Sharol Given on 10/25 Wound VAC in place Appreciate Dr. Sharol Given input Wound VAC in place On broad-spectrum antimicrobial therapy-Zyvox/meropenem.  Per ID note-end date of antibiotics 12/6.  COPD exacerbation Significantly better  Continue bronchodilators  Tapering prednisone   AKI on CKD stage IV AKI likely hemodynamically mediated Improving with supportive care  Creatinine seems to have plateaued Continue to hold diuretics-volume status remains  relatively stable and patient having spontaneous auto diuresis with significant urine output.   Frequent bladder scans to ensure no retention    Hyperkalemia In the setting of worsening renal function Resolved after Lokelma x3 doses on 11/11 Continue Lokelma until he can be back on diuretics  Acute on chronic HFpEF Volume status relatively stable-mild lower extremity edema continues  Since volume status remains stable-continue to hold diuretics and allow creatinine to downtrend further.  Paroxysmal atrial flutter In and out of A-fib/flutter Rate controlled with amiodarone/metoprolol On Eliquis Continue telemetry monitoring  Normocytic anemia Combination of CKD/acute illness No evidence of blood loss Follow CBC closely-on Eliquis.  HTN BP stable Continue amlodipine, metoprolol Follow/adjust  DM-2 (A1c 5.8 on 10/25) CBGs stable but fluctuating-prednisone being tapered down Continue Semglee 20 units daily and SSI   Recent Labs    04/16/22 2242 04/17/22 0618 04/17/22 0805  GLUCAP 197* 138* 145*     Gout Not in flare Continue colchicine  BPH Continue Flomax Frequent bladder scans to ensure no retention given fluctuating renal function.  Chronic peripheral neuropathy/chronic back pain Continue as needed narcotics/Neurontin  Mood disorder Stable-continue Prozac/Xanax  1.6 cm left thyroid nodule Incidental finding on CT chest Further work-up deferred to outpatient setting.  7 mm left lower lobe nodule Incidental finding on CT chest. Repeat CT chest 6-12 months.  4.3 cm ascending thoracic aneurysm Incidental finding on CT chest Annual imaging by CTA/MRA recommended.  Morbid Obesity: Estimated body mass index is 38.51 kg/m as calculated from the following:   Height as of this encounter: 6' (1.829 m).   Weight as of this encounter: 128.8 kg.   Code status:   Code Status: Full Code   DVT Prophylaxis: TED hose Start: 04/12/22 0815 SCDs Start: 04/12/22 0815 apixaban (ELIQUIS) tablet 5 mg    Family Communication:  Son-Zorion Zebrowski-[phone] 11/10 left  voicemail Daughter-[name]-[phone]-on 11/11-left voicemail   Disposition Plan: Status is: Inpatient Remains inpatient appropriate because: Improving sepsis physiology/hypoxemia-not yet stable for discharge.   Planned Discharge Destination:Skilled nursing facility likely 11/15.  Getting oritavancin today.   Diet: Diet Order             Diet heart healthy/carb modified Room service appropriate? Yes; Fluid consistency: Thin; Fluid restriction: 1500 mL Fluid  Diet effective now                     Antimicrobial agents: Anti-infectives (From admission, onward)    Start     Dose/Rate Route Frequency Ordered Stop   04/28/22 1000  linezolid (ZYVOX) tablet 600 mg        600 mg Oral Every 12 hours 04/17/22 1036 05/10/22 0959   04/17/22 1300  Oritavancin Diphosphate (ORBACTIV) 1,200 mg in dextrose 5 % IVPB        1,200 mg 333.3 mL/hr over 180 Minutes Intravenous Once 04/17/22 1036     04/13/22 1600  linezolid (ZYVOX) tablet 600 mg  Status:  Discontinued        600 mg Oral Every 12 hours 04/13/22 1514 04/17/22 1036   04/12/22 1000  meropenem (MERREM) injection 1 g  Status:  Discontinued       Note to Pharmacy: Indication:  Polymicrobial L-ankle osteo First Dose: Yes Last Day of Therapy:  05/09/22 Labs - Once weekly:  CBC/D and BMP, Labs - Every other week:  ESR and CRP Rate: 200 mL/hour     1 g Intravenous Every 12 hours 04/12/22 0824 04/12/22 0857   04/12/22 1000  linezolid (  ZYVOX) IVPB 600 mg  Status:  Discontinued        600 mg 300 mL/hr over 60 Minutes Intravenous Every 12 hours 04/12/22 0834 04/13/22 1514   04/12/22 1000  meropenem (MERREM) 1 g in sodium chloride 0.9 % 100 mL IVPB        1 g 200 mL/hr over 30 Minutes Intravenous Every 12 hours 04/12/22 0857     04/12/22 0630  vancomycin (VANCOCIN) IVPB 1000 mg/200 mL premix        1,000 mg 200 mL/hr over 60 Minutes Intravenous  Once 04/12/22 0616 04/12/22 0859   04/12/22 0630  ceFEPIme (MAXIPIME) 2 g in sodium  chloride 0.9 % 100 mL IVPB        2 g 200 mL/hr over 30 Minutes Intravenous  Once 04/12/22 0616 04/12/22 0757        MEDICATIONS: Scheduled Meds:  ALPRAZolam  0.5 mg Oral QHS   amiodarone  200 mg Oral BID   [START ON 04/19/2022] amiodarone  200 mg Oral Daily   amLODipine  10 mg Oral Daily   apixaban  5 mg Oral BID   arformoterol  15 mcg Nebulization BID   budesonide (PULMICORT) nebulizer solution  0.25 mg Nebulization BID   Chlorhexidine Gluconate Cloth  6 each Topical Daily   colchicine  0.3 mg Oral Daily   ferrous sulfate  325 mg Oral BID   FLUoxetine  40 mg Oral Daily   gabapentin  300 mg Oral QHS   HYDROcodone-acetaminophen  1 tablet Oral QID   insulin aspart  0-20 Units Subcutaneous TID AC & HS   insulin glargine-yfgn  20 Units Subcutaneous Daily   [START ON 04/28/2022] linezolid  600 mg Oral Q12H   magnesium oxide  400 mg Oral Daily   metoprolol tartrate  25 mg Oral BID   polyethylene glycol  17 g Oral Daily   revefenacin  175 mcg Nebulization Daily   senna  2 tablet Oral BID   sodium chloride flush  3 mL Intravenous Q12H   sodium chloride flush  3 mL Intravenous Q12H   sodium zirconium cyclosilicate  10 g Oral Daily   tamsulosin  0.4 mg Oral Q supper   Continuous Infusions:  sodium chloride     meropenem (MERREM) IV 1 g (04/16/22 2118)   Oritavancin Diphosphate (ORBACTIV) 1,200 mg in dextrose 5 % IVPB     PRN Meds:.sodium chloride, acetaminophen **OR** acetaminophen, albuterol, bisacodyl, hydrALAZINE, HYDROmorphone (DILAUDID) injection, ondansetron **OR** ondansetron (ZOFRAN) IV, oxyCODONE, senna-docusate, sodium chloride flush, sodium chloride flush, sodium phosphate, traZODone   I have personally reviewed following labs and imaging studies  LABORATORY DATA: CBC: Recent Labs  Lab 04/12/22 0531 04/13/22 0233 04/14/22 0129 04/15/22 0417 04/16/22 0339 04/17/22 0450  WBC 20.1* 11.2* 11.7* 12.6* 11.9* 12.8*  NEUTROABS 15.2*  --   --   --   --   --   HGB  10.4* 9.2* 9.2* 8.7* 8.5* 8.5*  HCT 32.2* 28.2* 27.9* 26.9* 25.8* 27.1*  MCV 93.9 92.2 93.0 92.1 93.8 95.8  PLT 388 270 265 259 243 223     Basic Metabolic Panel: Recent Labs  Lab 04/12/22 0833 04/13/22 0233 04/14/22 0129 04/15/22 0417 04/16/22 0339 04/17/22 0450  NA  --  136 138 140 139 141  K  --  4.6 5.7* 4.2 4.9 5.0  CL  --  96* 98 97* 97* 100  CO2  --  _0 32  GLUCOSE  --  256* 172* 210*  221* 156*  BUN  --  99* 108* 109* 107* 105*  CREATININE  --  3.33* 3.70* 3.36* 3.45* 3.34*  CALCIUM  --  8.5* 8.6* 8.2* 8.3* 8.4*  MG 2.0  --   --   --   --   --   PHOS 4.6  --   --   --   --   --      GFR: Estimated Creatinine Clearance: 28.1 mL/min (A) (by C-G formula based on SCr of 3.34 mg/dL (H)).  Liver Function Tests: Recent Labs  Lab 04/12/22 0531  AST 12*  ALT 12  ALKPHOS 62  BILITOT 0.7  PROT 6.7  ALBUMIN 2.9*    No results for input(s): "LIPASE", "AMYLASE" in the last 168 hours. No results for input(s): "AMMONIA" in the last 168 hours.  Coagulation Profile: Recent Labs  Lab 04/12/22 0924 04/13/22 0233  INR 1.4* 1.4*     Cardiac Enzymes: No results for input(s): "CKTOTAL", "CKMB", "CKMBINDEX", "TROPONINI" in the last 168 hours.  BNP (last 3 results) No results for input(s): "PROBNP" in the last 8760 hours.  Lipid Profile: No results for input(s): "CHOL", "HDL", "LDLCALC", "TRIG", "CHOLHDL", "LDLDIRECT" in the last 72 hours.  Thyroid Function Tests: No results for input(s): "TSH", "T4TOTAL", "FREET4", "T3FREE", "THYROIDAB" in the last 72 hours.  Anemia Panel: No results for input(s): "VITAMINB12", "FOLATE", "FERRITIN", "TIBC", "IRON", "RETICCTPCT" in the last 72 hours.  Urine analysis:    Component Value Date/Time   COLORURINE YELLOW 05/11/2020 2100   APPEARANCEUR HAZY (A) 05/11/2020 2100   LABSPEC 1.012 05/11/2020 2100   PHURINE 5.0 05/11/2020 2100   GLUCOSEU NEGATIVE 05/11/2020 2100   HGBUR NEGATIVE 05/11/2020 2100   BILIRUBINUR  NEGATIVE 05/11/2020 2100   KETONESUR NEGATIVE 05/11/2020 2100   PROTEINUR 30 (A) 05/11/2020 2100   UROBILINOGEN 0.2 03/04/2015 1835   NITRITE NEGATIVE 05/11/2020 2100   LEUKOCYTESUR MODERATE (A) 05/11/2020 2100    Sepsis Labs: Lactic Acid, Venous    Component Value Date/Time   LATICACIDVEN 1.1 04/12/2022 8768    MICROBIOLOGY: Recent Results (from the past 240 hour(s))  Culture, blood (routine x 2)     Status: None   Collection Time: 04/12/22  5:31 AM   Specimen: BLOOD  Result Value Ref Range Status   Specimen Description BLOOD CENTRAL LINE  Final   Special Requests   Final    BOTTLES DRAWN AEROBIC AND ANAEROBIC Blood Culture adequate volume   Culture   Final    NO GROWTH 5 DAYS Performed at [facility], [address], [place], [place] [address]    Report Status 04/17/2022 FINAL  Final  SARS Coronavirus 2 by RT PCR (hospital order, performed in [facility] hospital lab) *cepheid single result test* Anterior Nasal Swab     Status: None   Collection Time: 04/12/22  6:23 AM   Specimen: Anterior Nasal Swab  Result Value Ref Range Status   SARS Coronavirus 2 by RT PCR NEGATIVE NEGATIVE Final    Comment: (NOTE) SARS-CoV-2 target nucleic acids are NOT DETECTED.  The SARS-CoV-2 RNA is generally detectable in upper and lower respiratory specimens during the acute phase of infection. The lowest concentration of SARS-CoV-2 viral copies this assay can detect is 250 copies / mL. A negative result does not preclude SARS-CoV-2 infection and should not be used as the sole basis for treatment or other patient management decisions.  A negative result may occur with improper specimen collection / handling, submission of specimen other than nasopharyngeal swab,  presence of viral mutation(s) within the areas targeted by this assay, and inadequate number of viral copies (<250 copies / mL). A negative result must be combined with clinical observations, patient history, and epidemiological  information.  Fact Sheet for Patients:   https://www.patel.info/  Fact Sheet for Healthcare Providers: https://hall.com/  This test is not yet approved or  cleared by the [place] FDA and has been authorized for detection and/or diagnosis of SARS-CoV-2 by FDA under an Emergency Use Authorization (EUA).  This EUA will remain in effect (meaning this test can be used) for the duration of the COVID-19 declaration under Section 564(b)(1) of the Act, 21 U.S.C. section 360bbb-3(b)(1), unless the authorization is terminated or revoked sooner.  Performed at [facility], [address], [place], [place] [address]   Culture, blood (routine x 2)     Status: None   Collection Time: 04/12/22  6:32 AM   Specimen: BLOOD  Result Value Ref Range Status   Specimen Description BLOOD LEFT ARM  Final   Special Requests   Final    BOTTLES DRAWN AEROBIC ONLY Blood Culture adequate volume   Culture   Final    NO GROWTH 5 DAYS Performed at [facility], [address] [place], [place] [address]    Report Status 04/17/2022 FINAL  Final    RADIOLOGY STUDIES/RESULTS: No results found.   LOS: 5 days   Oren Binet, MD  [facility]    To contact the attending provider between 7A-7P or the covering provider during after hours 7P-7A, please log into the web site [email or web address] and access using universal [place] password for that web site. If you do not have the password, please call the hospital operator.  04/17/2022, 11:35 AM

## 2022-04-17 NOTE — Plan of Care (Signed)
  Problem: Metabolic: Goal: Ability to maintain appropriate glucose levels will improve 04/17/2022 1705 by Birdie Hopes, RN Outcome: Progressing 04/17/2022 1704 by Birdie Hopes, RN Outcome: Progressing   Problem: Nutritional: Goal: Maintenance of adequate nutrition will improve 04/17/2022 1705 by Birdie Hopes, RN Outcome: Progressing 04/17/2022 1704 by Birdie Hopes, RN Outcome: Progressing Goal: Progress toward achieving an optimal weight will improve 04/17/2022 1705 by Birdie Hopes, RN Outcome: Progressing 04/17/2022 1704 by Birdie Hopes, RN Outcome: Progressing   Problem: Nutritional: Goal: Progress toward achieving an optimal weight will improve 04/17/2022 1705 by Birdie Hopes, RN Outcome: Progressing 04/17/2022 1704 by Birdie Hopes, RN Outcome: Progressing   Problem: Skin Integrity: Goal: Risk for impaired skin integrity will decrease 04/17/2022 1705 by Birdie Hopes, RN Outcome: Progressing 04/17/2022 1704 by Birdie Hopes, RN Outcome: Progressing   Problem: Tissue Perfusion: Goal: Adequacy of tissue perfusion will improve 04/17/2022 1705 by Birdie Hopes, RN Outcome: Progressing 04/17/2022 1704 by Birdie Hopes, RN Outcome: Progressing

## 2022-04-17 NOTE — Plan of Care (Signed)

## 2022-04-17 NOTE — [place] FL2 (Signed)
[place] MEDICAID FL2 LEVEL OF CARE SCREENING TOOL     IDENTIFICATION  Patient Name: Eric Scott Birthdate: 06-20-1950 Sex: male Admission Date (Current Location): 04/12/2022  [facility] and [place] Number:  [employer] and Address:  [facility], [place] [address] [place], [place] [address]      Provider Number: [id number]  Attending Physician Name and Address:  Jonetta Osgood, MD  Relative Name and Phone Number:       Current Level of Care: Hospital Recommended Level of Care: [place] Prior Approval Number:    Date Approved/Denied:   PASRR Number: [id number]  Discharge Plan: SNF    Current Diagnoses: Patient Active Problem List   Diagnosis Date Noted   PAF (paroxysmal atrial fibrillation) ([place]) 04/06/2022   Mood disorder ([place]) 04/02/2022   Stage 4 chronic kidney disease ([place])    Osteomyelitis of ankle or foot, acute, left ([place]) 03/29/2022   Ulcer of left lower leg, with necrosis of bone ([place]) 03/28/2022   Abscess of left lower leg 78/46/9629   Hardware complicating wound infection ([place]) 03/28/2022   Leg wound, left, sequela 03/28/2022   Cholelithiasis 05/12/2020   Elevated liver enzymes    RUQ pain    Bacteremia 05/11/2020   COPD  GOLD ?  / AB  01/26/2020   Acute on chronic respiratory failure with hypoxia ([place]) 12/18/2019   Respiratory failure with hypoxia ([place]) 11/17/2019   Normocytic anemia 11/16/2019   Cellulitis of both lower extremities 11/16/2019   Leukocytosis 05/22/2019   Chronic heart failure with preserved ejection fraction (HFpEF) ([place]) 05/22/2019   Acute respiratory failure ([place]) 05/17/2019   Obesity, Class III, BMI 40-49.9 (morbid obesity) ([place]) 05/17/2019   Atrial flutter, paroxysmal ([place]) 05/17/2019   COPD with acute exacerbation ([place]) 04/20/2019   GERD (gastroesophageal reflux disease) 08/08/2018   PUD (peptic ulcer disease)    Gastric erosions 02/13/2017   Duodenal ulcer 02/13/2017    Transaminitis    UGI bleed    Arm DVT (deep venous thromboembolism), acute, right ([place]) 12/18/2016   Uremia 12/10/2016   Acute renal failure ([name])    Volume depletion 11/23/2016   Hypotension    Liver enzyme elevation    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 ([place]) 11/15/2016   Arterial insufficiency of lower extremity ([place]) 11/15/2016   Pressure injury of skin 11/09/2016   Sepsis ([place]) 11/09/2016   Osteomyelitis ([place]) 11/09/2016   Wounds, multiple 11/08/2016   Chronic venous insufficiency 10/12/2016   Critical lower limb ischemia ([place]) 10/12/2016   Fatty liver 09/10/2016   History of colonic polyps 09/10/2016   Gallstone 09/10/2016   AKI (acute kidney injury) ([place]) 03/04/2015   Fall 03/04/2015   Generalized weakness 03/04/2015   Diabetic polyneuropathy associated with type 2 diabetes mellitus ([place])    Anxiety    PVD (peripheral vascular disease) ([place])    Pneumonia 07/20/2014   Anxiety state 04/18/2014   Acute on chronic diastolic CHF (congestive heart failure) ([place]) 04/07/2014   Solitary pulmonary nodule 04/07/2014   COPD with exacerbation ([place]) 04/02/2014   Hypoxia 04/02/2014   Cerebral infarction ([place]) 06/01/2012   Chronic pain (back, legs) 05/30/2012   Toe laceration, 4th toe 05/30/2012   C. difficile diarrhea 05/30/2012   Hypokalemia 05/30/2012   Acute lacunar stroke ([place]) 05/27/2012   Type 2 diabetes mellitus with stage 4 chronic kidney disease, with [place]-term current use of insulin ([place]) 05/27/2012   Smoker 05/27/2012  HTN (hypertension) 05/27/2012    Orientation RESPIRATION BLADDER Height & Weight     Self, Time, Situation, Place  O2 (3.5L Nasal cannula) Continent, External catheter Weight: 283 lb 15.2 oz (128.8 kg) Height:  6' (182.9 cm)  BEHAVIORAL SYMPTOMS/MOOD NEUROLOGICAL BOWEL NUTRITION STATUS      Incontinent Diet (See dc summary)  AMBULATORY STATUS COMMUNICATION OF NEEDS Skin   Total Care Verbally  Surgical wounds (Closed incision on pretibial)                       Personal Care Assistance Level of Assistance  Bathing, Feeding, Dressing Bathing Assistance: Maximum assistance Feeding assistance: Limited assistance Dressing Assistance: Maximum assistance     Functional Limitations Info             SPECIAL CARE FACTORS FREQUENCY  PT (By licensed PT), OT (By licensed OT)     PT Frequency: eval and treat OT Frequency: eval and treat            Contractures Contractures Info: Not present    Additional Factors Info  Isolation Precautions Code Status Info: Full Allergies Info: Baclofen, Blueberry Flavor, Cucumber Extract, Flexeril (Cyclobenzaprine Hcl), Kiwi Extract   Insulin Sliding Scale Info: See dc summary Isolation Precautions Info: OMDRO, VRE, MRSA (04/02/22)     Current Medications (04/17/2022):  This is the current hospital active medication list Current Facility-Administered Medications  Medication Dose Route Frequency Provider Last Rate Last Admin   0.9 %  sodium chloride infusion  250 mL Intravenous PRN Shahmehdi, Seyed A, MD       acetaminophen (TYLENOL) tablet 650 mg  650 mg Oral Q6H PRN Shahmehdi, Seyed A, MD       Or   acetaminophen (TYLENOL) suppository 650 mg  650 mg Rectal Q6H PRN Shahmehdi, Seyed A, MD       albuterol (PROVENTIL) (2.5 MG/3ML) 0.083% nebulizer solution 2.5 mg  2.5 mg Nebulization Q2H PRN Ghimire, Henreitta Leber, MD       ALPRAZolam Duanne Moron) tablet 0.5 mg  0.5 mg Oral QHS Shahmehdi, Seyed A, MD   0.5 mg at 04/16/22 2105   amiodarone (PACERONE) tablet 200 mg  200 mg Oral BID Shahmehdi, Seyed A, MD   200 mg at 04/16/22 2107   [START ON 04/19/2022] amiodarone (PACERONE) tablet 200 mg  200 mg Oral Daily Madueme, Elvira C, RPH       amLODipine (NORVASC) tablet 10 mg  10 mg Oral Daily Shahmehdi, Seyed A, MD   10 mg at 04/16/22 4270   apixaban (ELIQUIS) tablet 5 mg  5 mg Oral BID Shahmehdi, Seyed A, MD   5 mg at 04/16/22 2106   arformoterol  (BROVANA) nebulizer solution 15 mcg  15 mcg Nebulization BID Jonetta Osgood, MD   15 mcg at 04/17/22 0831   bisacodyl (DULCOLAX) EC tablet 5 mg  5 mg Oral Daily PRN Shahmehdi, Seyed A, MD       budesonide (PULMICORT) nebulizer solution 0.25 mg  0.25 mg Nebulization BID Jonetta Osgood, MD   0.25 mg at 04/17/22 0831   Chlorhexidine Gluconate Cloth 2 % PADS 6 each  6 each Topical Daily Jonetta Osgood, MD   6 each at 04/16/22 1736   colchicine tablet 0.3 mg  0.3 mg Oral Daily Shahmehdi, Seyed A, MD   0.3 mg at 04/16/22 0853   ferrous sulfate tablet 325 mg  325 mg Oral BID Skipper Cliche A, MD   325 mg at 04/16/22 2106  FLUoxetine (PROZAC) capsule 40 mg  40 mg Oral Daily Shahmehdi, Seyed A, MD   40 mg at 04/16/22 0851   gabapentin (NEURONTIN) capsule 300 mg  300 mg Oral QHS Shahmehdi, Seyed A, MD   300 mg at 04/16/22 2103   hydrALAZINE (APRESOLINE) injection 10 mg  10 mg Intravenous Q4H PRN Shahmehdi, Seyed A, MD       HYDROcodone-acetaminophen (NORCO) 7.5-325 MG per tablet 1 tablet  1 tablet Oral QID Skipper Cliche A, MD   1 tablet at 04/16/22 2105   HYDROmorphone (DILAUDID) injection 0.5-1 mg  0.5-1 mg Intravenous Q2H PRN Shahmehdi, Erling Conte A, MD   1 mg at 04/15/22 1709   insulin aspart (novoLOG) injection 0-20 Units  0-20 Units Subcutaneous TID [facility] Mansy, Arvella Merles, MD   3 Units at 04/17/22 0651   insulin glargine-yfgn (SEMGLEE) injection 20 Units  20 Units Subcutaneous Daily Skipper Cliche A, MD   20 Units at 04/16/22 0853   linezolid (ZYVOX) tablet 600 mg  600 mg Oral Q12H Laurice Record, MD   600 mg at 04/16/22 2103   magnesium oxide (MAG-OX) tablet 400 mg  400 mg Oral Daily Shahmehdi, Seyed A, MD   400 mg at 04/16/22 0852   meropenem (MERREM) 1 g in sodium chloride 0.9 % 100 mL IVPB  1 g Intravenous Q12H Shahmehdi, Seyed A, MD 200 mL/hr at 04/16/22 2118 1 g at 04/16/22 2118   metoprolol tartrate (LOPRESSOR) tablet 25 mg  25 mg Oral BID Skipper Cliche A, MD   25 mg at 04/16/22  2104   ondansetron (ZOFRAN) tablet 4 mg  4 mg Oral Q6H PRN Shahmehdi, Seyed A, MD       Or   ondansetron (ZOFRAN) injection 4 mg  4 mg Intravenous Q6H PRN Shahmehdi, Seyed A, MD       oxyCODONE (Oxy IR/ROXICODONE) immediate release tablet 5 mg  5 mg Oral Q4H PRN Shahmehdi, Seyed A, MD   5 mg at 04/16/22 1028   polyethylene glycol (MIRALAX / GLYCOLAX) packet 17 g  17 g Oral Daily Shahmehdi, Seyed A, MD       predniSONE (DELTASONE) tablet 20 mg  20 mg Oral Q breakfast Ghimire, Shanker M, MD   20 mg at 04/16/22 0851   revefenacin (YUPELRI) nebulizer solution 175 mcg  175 mcg Nebulization Daily Jonetta Osgood, MD   175 mcg at 04/17/22 0831   senna (SENOKOT) tablet 17.2 mg  2 tablet Oral BID Shahmehdi, Seyed A, MD   17.2 mg at 04/16/22 0851   senna-docusate (Senokot-S) tablet 1 tablet  1 tablet Oral QHS PRN Shahmehdi, Seyed A, MD       sodium chloride flush (NS) 0.9 % injection 10-40 mL  10-40 mL Intracatheter PRN Ghimire, Henreitta Leber, MD       sodium chloride flush (NS) 0.9 % injection 3 mL  3 mL Intravenous Q12H Shahmehdi, Seyed A, MD   3 mL at 04/16/22 0855   sodium chloride flush (NS) 0.9 % injection 3 mL  3 mL Intravenous Q12H Shahmehdi, Seyed A, MD   3 mL at 04/16/22 2107   sodium chloride flush (NS) 0.9 % injection 3 mL  3 mL Intravenous PRN Shahmehdi, Seyed A, MD       sodium phosphate (FLEET) 7-19 GM/118ML enema 1 enema  1 enema Rectal Once PRN Shahmehdi, Seyed A, MD       sodium zirconium cyclosilicate (LOKELMA) packet 10 g  10 g Oral Daily Ghimire, Shanker M,  MD   10 g at 04/16/22 0854   tamsulosin (FLOMAX) capsule 0.4 mg  0.4 mg Oral Q supper Shahmehdi, Seyed A, MD   0.4 mg at 04/16/22 1709   traZODone (DESYREL) tablet 25 mg  25 mg Oral QHS PRN Deatra James, MD         Discharge Medications: Please see discharge summary for a list of discharge medications.  Relevant Imaging Results:  Relevant Lab Results:   Additional Information SSN [id number]. Requires IV Merrem until  05/09/22.  Benard Halsted, LCSW

## 2022-04-18 DIAGNOSIS — R6520 Severe sepsis without septic shock: Secondary | ICD-10-CM | POA: Diagnosis not present

## 2022-04-18 DIAGNOSIS — J9600 Acute respiratory failure, unspecified whether with hypoxia or hypercapnia: Secondary | ICD-10-CM | POA: Diagnosis not present

## 2022-04-18 DIAGNOSIS — A419 Sepsis, unspecified organism: Secondary | ICD-10-CM | POA: Diagnosis not present

## 2022-04-18 LAB — BASIC METABOLIC PANEL
Anion gap: 10 (ref 5–15)
BUN: 101 mg/dL — ABNORMAL HIGH (ref 8–23)
CO2: 30 mmol/L (ref 22–32)
Calcium: 8.1 mg/dL — ABNORMAL LOW (ref 8.9–10.3)
Chloride: 98 mmol/L (ref 98–111)
Creatinine, Ser: 3.36 mg/dL — ABNORMAL HIGH (ref 0.61–1.24)
GFR, Estimated: 19 mL/min — ABNORMAL LOW (ref >60)
Glucose, Bld: 156 mg/dL — ABNORMAL HIGH (ref 70–99)
Potassium: 5.3 mmol/L — ABNORMAL HIGH (ref 3.5–5.1)
Sodium: 138 mmol/L (ref 135–145)

## 2022-04-18 LAB — GLUCOSE, CAPILLARY
Glucose-Capillary: 156 mg/dL — ABNORMAL HIGH (ref 70–99)
Glucose-Capillary: 156 mg/dL — ABNORMAL HIGH (ref 70–99)
Glucose-Capillary: 173 mg/dL — ABNORMAL HIGH (ref 70–99)
Glucose-Capillary: 252 mg/dL — ABNORMAL HIGH (ref 70–99)
Glucose-Capillary: 181 mg/dL — ABNORMAL HIGH (ref 70–99)

## 2022-04-18 MED ORDER — SODIUM ZIRCONIUM CYCLOSILICATE 10 G PO PACK
10.0000 g | PACK | Freq: Two times a day (BID) | ORAL | Status: DC
  Administered 2022-04-18 (×2): 10 g via ORAL
  Filled 2022-04-18 (×2): qty 1

## 2022-04-18 MED ORDER — TORSEMIDE 20 MG PO TABS
80.0000 mg | ORAL_TABLET | Freq: Every day | ORAL | Status: DC
  Administered 2022-04-18 – 2022-04-20 (×3): 80 mg via ORAL
  Filled 2022-04-18 (×3): qty 4

## 2022-04-18 NOTE — Plan of Care (Signed)

## 2022-04-18 NOTE — Progress Notes (Addendum)
PROGRESS NOTE        PATIENT DETAILS Name: Eric Scott Age: 71 y.o. Sex: male Date of Birth: 12-28-1950 Admit Date: 04/12/2022 Admitting Physician Eric James, MD ASN:[id number], Eric Olden, MD  Brief Summary: Patient is a 71 y.o.  male with history of COPD on 3-4 L of oxygen at home, HFpEF, DM-2, CKD stage IV-who was recently hospitalized (10/25-11/8) for left ankle osteomyelitis-s/p hardware removal and debridement (polymicrobial infection with Acinetobacter/Morganella/Enterococcus faecalis/Staph aureus)-hospital course was complicated by worsening respiratory failure requiring BiPAP in the setting of volume overload/COPD exacerbation-patient was stabilized-and subsequently discharged to SNF on 11/8, unfortunately patient returned to [facility] ED on 11/9 with worsening hypoxemia-and was found to have new left upper lobe PNA and admitted to the hospitalist service.  See below for further details.   Significant events: 10/25-11/8>> hospitalization for left ankle osteomyelitis-s/p hardware removal-developed worsening hypoxemia due to COPD exacerbation/CHF and A-fib RVR.  Stabilized-ultimately discharged to SNF. 11/9>> presented to EP ED from SNF for worsening shortness of breath-CXR with left upper lobe PNA.  Transferred to [facility].  Significant studies: 11/10>> CT chest: Large left upper lobe opacity-likely PNA.  Significant microbiology data: 10/25>> left ankle wound: Morganella/Acinetobacter/Enterococcus faecalis/MRSA/VRE 11/9>> COVID PCR: Negative 11/9>> blood culture: No growth  Procedures: 10/25>> left ankle hardware removal by Dr. Sharol Scott.  Consults: None  Subjective: No major issues overnight.  Lying comfortably in bed.  Objective: Vitals: Blood pressure (!) 118/52, pulse 66, temperature 97.6 F (36.4 C), resp. rate 14, height 6' (1.829 m), weight 128.8 kg, SpO2 98 %.   Exam: Gen Exam:Alert awake-not in any distress HEENT:atraumatic, normocephalic Chest: B/L  clear to auscultation anteriorly CVS:S1S2 regular Abdomen:soft non tender, non distended Extremities:+ edema Neurology: Non focal Skin: no rash  Pertinent Labs/Radiology:    Latest Ref Rng & Units 04/17/2022    4:50 AM 04/16/2022    3:39 AM 04/15/2022    4:17 AM  CBC  WBC 4.0 - 10.5 K/uL 12.8  11.9  12.6   Hemoglobin 13.0 - 17.0 g/dL 8.5  8.5  8.7   Hematocrit 39.0 - 52.0 % 27.1  25.8  26.9   Platelets 150 - 400 K/uL 223  243  259     Lab Results  Component Value Date   NA 138 04/18/2022   K 5.3 (H) 04/18/2022   CL 98 04/18/2022   CO2 30 04/18/2022      Assessment/Plan: Acute on chronic hypoxic respiratory failure 2/2 Left upper lobe PNA/COPD exacerbation Significant improvement with IV antibiotics-back on his usual regimen of 3-4 L  Discussed with ID MD-Dr. Candiss Scott on 11/13-scenario more consistent with infection/aspirationpneumonia (predominantly left upper lobe infiltrate)-ID feels that patient could have pneumonitis secondary to daptomycin.  ID discontinued daptomycin on 11/14.  Remains on Zyvox/meropenem  Severe sepsis Due to left upper lobe PNA Sepsis physiology resolved Cultures negative to date. Remains on Zyvox/meropenem-no longer on daptomycin-1 dose of oritavancin on 11/14.  Polymicrobial left ankle osteomyelitis-s/p debridement/hardware removal by Dr. Sharol Scott on 10/25 Wound VAC in place Appreciate Dr. Sharol Scott input Wound VAC in place No longer on daptomycin-due to concern for possible pneumonitis (see above)-received 1 dose of oritavancin on 11/14-we will continue to be on Zyvox/meropenem until 12/6.    COPD exacerbation Significantly better  Continue bronchodilators  Completed tapering prednisone.  AKI on CKD stage IV AKI likely hemodynamically mediated Improving with supportive care  Creatinine seems to have plateaued Developing more edema now-restarting diuretics. Per patient-he has follow-up with his primary nephrologist Dr Eric Scott in a  week.  Hyperkalemia Mild-reoccurred this morning  Change Lokelma to twice daily dosing-restarting Demadex today.    Acute on chronic HFpEF Off diuretics for the past several days More lower extremity edema today Restart diuretics today.  Paroxysmal atrial flutter In and out of A-fib/flutter Rate controlled with amiodarone/metoprolol On Eliquis Continue telemetry monitoring  Normocytic anemia Combination of CKD/acute illness No evidence of blood loss Follow CBC closely-on Eliquis.  HTN BP stable Continue amlodipine, metoprolol Follow/adjust  DM-2 (A1c 5.8 on 10/25) CBGs stable but fluctuating-prednisone being tapered down Continue Semglee 20 units daily and SSI   Recent Labs    04/17/22 2052 04/18/22 0642 04/18/22 0808  GLUCAP 189* 156* 156*     Gout Not in flare Continue colchicine  BPH Continue Flomax Frequent bladder scans to ensure no retention Scott fluctuating renal function.  Chronic peripheral neuropathy/chronic back pain Continue as needed narcotics/Neurontin  Mood disorder Stable-continue Prozac/Xanax  1.6 cm left thyroid nodule Incidental finding on CT chest Further work-up deferred to outpatient setting.  7 mm left lower lobe nodule Incidental finding on CT chest. Repeat CT chest 6-12 months.  4.3 cm ascending thoracic aneurysm Incidental finding on CT chest Annual imaging by CTA/MRA recommended.  Morbid Obesity: Estimated body mass index is 38.51 kg/m as calculated from the following:   Height as of this encounter: 6' (1.829 m).   Weight as of this encounter: 128.8 kg.   Code status:   Code Status: Full Code   DVT Prophylaxis: TED hose Start: 04/12/22 0815 SCDs Start: 04/12/22 0815 apixaban (ELIQUIS) tablet 5 mg    Family Communication:  Son-Eric Scott-[phone]-on 11/10 left voicemail Daughter-Eric Scott-[phone]-on 11/11-left voicemail   Disposition Plan: Status is: Inpatient Remains inpatient appropriate  because: Improving sepsis physiology/hypoxemia-not yet stable for discharge.   Planned Discharge Destination:Skilled nursing facility likely 11/15.  Getting oritavancin today.   Diet: Diet Order             Diet heart healthy/carb modified Room service appropriate? Yes; Fluid consistency: Thin; Fluid restriction: 1500 mL Fluid  Diet effective now                     Antimicrobial agents: Anti-infectives (From admission, onward)    Start     Dose/Rate Route Frequency Ordered Stop   04/28/22 1000  linezolid (ZYVOX) tablet 600 mg        600 mg Oral Every 12 hours 04/17/22 1036 05/10/22 0959   04/17/22 1300  Oritavancin Diphosphate (ORBACTIV) 1,200 mg in dextrose 5 % IVPB        1,200 mg 333.3 mL/hr over 180 Minutes Intravenous Once 04/17/22 1036 04/17/22 1746   04/13/22 1600  linezolid (ZYVOX) tablet 600 mg  Status:  Discontinued        600 mg Oral Every 12 hours 04/13/22 1514 04/17/22 1036   04/12/22 1000  meropenem (MERREM) injection 1 g  Status:  Discontinued       Note to Pharmacy: Indication:  Polymicrobial L-ankle osteo First Dose: Yes Last Day of Therapy:  05/09/22 Labs - Once weekly:  CBC/D and BMP, Labs - Every other week:  ESR and CRP Rate: 200 mL/hour     1 g Intravenous Every 12 hours 04/12/22 0824 04/12/22 0857   04/12/22 1000  linezolid (ZYVOX) IVPB 600 mg  Status:  Discontinued        600  mg 300 mL/hr over 60 Minutes Intravenous Every 12 hours 04/12/22 0834 04/13/22 1514   04/12/22 1000  meropenem (MERREM) 1 g in sodium chloride 0.9 % 100 mL IVPB        1 g 200 mL/hr over 30 Minutes Intravenous Every 12 hours 04/12/22 0857     04/12/22 0630  vancomycin (VANCOCIN) IVPB 1000 mg/200 mL premix        1,000 mg 200 mL/hr over 60 Minutes Intravenous  Once 04/12/22 0616 04/12/22 0859   04/12/22 0630  ceFEPIme (MAXIPIME) 2 g in sodium chloride 0.9 % 100 mL IVPB        2 g 200 mL/hr over 30 Minutes Intravenous  Once 04/12/22 0616 04/12/22 0757         MEDICATIONS: Scheduled Meds:  ALPRAZolam  0.5 mg Oral QHS   amiodarone  200 mg Oral BID   [START ON 04/19/2022] amiodarone  200 mg Oral Daily   amLODipine  10 mg Oral Daily   apixaban  5 mg Oral BID   arformoterol  15 mcg Nebulization BID   budesonide (PULMICORT) nebulizer solution  0.25 mg Nebulization BID   Chlorhexidine Gluconate Cloth  6 each Topical Daily   colchicine  0.3 mg Oral Daily   ferrous sulfate  325 mg Oral BID   FLUoxetine  40 mg Oral Daily   gabapentin  300 mg Oral QHS   HYDROcodone-acetaminophen  1 tablet Oral QID   insulin aspart  0-20 Units Subcutaneous TID AC & HS   insulin glargine-yfgn  20 Units Subcutaneous Daily   [START ON 04/28/2022] linezolid  600 mg Oral Q12H   magnesium oxide  400 mg Oral Daily   metoprolol tartrate  25 mg Oral BID   polyethylene glycol  17 g Oral Daily   revefenacin  175 mcg Nebulization Daily   senna  2 tablet Oral BID   sodium chloride flush  3 mL Intravenous Q12H   sodium chloride flush  3 mL Intravenous Q12H   sodium zirconium cyclosilicate  10 g Oral BID   tamsulosin  0.4 mg Oral Q supper   torsemide  80 mg Oral Daily   Continuous Infusions:  sodium chloride     meropenem (MERREM) IV 1 g (04/18/22 0926)   PRN Meds:.sodium chloride, acetaminophen **OR** acetaminophen, albuterol, bisacodyl, hydrALAZINE, HYDROmorphone (DILAUDID) injection, ondansetron **OR** ondansetron (ZOFRAN) IV, oxyCODONE, senna-docusate, sodium chloride flush, sodium chloride flush, sodium phosphate, traZODone   I have personally reviewed following labs and imaging studies  LABORATORY DATA: CBC: Recent Labs  Lab 04/12/22 0531 04/13/22 0233 04/14/22 0129 04/15/22 0417 04/16/22 0339 04/17/22 0450  WBC 20.1* 11.2* 11.7* 12.6* 11.9* 12.8*  NEUTROABS 15.2*  --   --   --   --   --   HGB 10.4* 9.2* 9.2* 8.7* 8.5* 8.5*  HCT 32.2* 28.2* 27.9* 26.9* 25.8* 27.1*  MCV 93.9 92.2 93.0 92.1 93.8 95.8  PLT 388 270 265 259 243 223     Basic  Metabolic Panel: Recent Labs  Lab 04/12/22 0833 04/13/22 0233 04/14/22 0129 04/15/22 0417 04/16/22 0339 04/17/22 0450 04/18/22 0233  NA  --    < > 138 140 139 141 138  K  --    < > 5.7* 4.2 4.9 5.0 5.3*  CL  --    < > 98 97* 97* 100 98  CO2  --    < > _0 32 30  GLUCOSE  --    < > 172* 210* 221*  156* 156*  BUN  --    < > 108* 109* 107* 105* 101*  CREATININE  --    < > 3.70* 3.36* 3.45* 3.34* 3.36*  CALCIUM  --    < > 8.6* 8.2* 8.3* 8.4* 8.1*  MG 2.0  --   --   --   --   --   --   PHOS 4.6  --   --   --   --   --   --    < > = values in this interval not displayed.     GFR: Estimated Creatinine Clearance: 28 mL/min (A) (by C-G formula based on SCr of 3.36 mg/dL (H)).  Liver Function Tests: Recent Labs  Lab 04/12/22 0531  AST 12*  ALT 12  ALKPHOS 62  BILITOT 0.7  PROT 6.7  ALBUMIN 2.9*    No results for input(s): "LIPASE", "AMYLASE" in the last 168 hours. No results for input(s): "AMMONIA" in the last 168 hours.  Coagulation Profile: Recent Labs  Lab 04/12/22 0924 04/13/22 0233  INR 1.4* 1.4*     Cardiac Enzymes: No results for input(s): "CKTOTAL", "CKMB", "CKMBINDEX", "TROPONINI" in the last 168 hours.  BNP (last 3 results) No results for input(s): "PROBNP" in the last 8760 hours.  Lipid Profile: No results for input(s): "CHOL", "HDL", "LDLCALC", "TRIG", "CHOLHDL", "LDLDIRECT" in the last 72 hours.  Thyroid Function Tests: No results for input(s): "TSH", "T4TOTAL", "FREET4", "T3FREE", "THYROIDAB" in the last 72 hours.  Anemia Panel: No results for input(s): "VITAMINB12", "FOLATE", "FERRITIN", "TIBC", "IRON", "RETICCTPCT" in the last 72 hours.  Urine analysis:    Component Value Date/Time   COLORURINE YELLOW 05/11/2020 2100   APPEARANCEUR HAZY (A) 05/11/2020 2100   LABSPEC 1.012 05/11/2020 2100   PHURINE 5.0 05/11/2020 2100   GLUCOSEU NEGATIVE 05/11/2020 2100   HGBUR NEGATIVE 05/11/2020 2100   BILIRUBINUR NEGATIVE 05/11/2020 2100    KETONESUR NEGATIVE 05/11/2020 2100   PROTEINUR 30 (A) 05/11/2020 2100   UROBILINOGEN 0.2 03/04/2015 1835   NITRITE NEGATIVE 05/11/2020 2100   LEUKOCYTESUR MODERATE (A) 05/11/2020 2100    Sepsis Labs: Lactic Acid, Venous    Component Value Date/Time   LATICACIDVEN 1.1 04/12/2022 5038    MICROBIOLOGY: Recent Results (from the past 240 hour(s))  Culture, blood (routine x 2)     Status: None   Collection Time: 04/12/22  5:31 AM   Specimen: BLOOD  Result Value Ref Range Status   Specimen Description BLOOD CENTRAL LINE  Final   Special Requests   Final    BOTTLES DRAWN AEROBIC AND ANAEROBIC Blood Culture adequate volume   Culture   Final    NO GROWTH 5 DAYS Performed at [facility], [address], [place], [place] [address]    Report Status 04/17/2022 FINAL  Final  SARS Coronavirus 2 by RT PCR (hospital order, performed in [facility] hospital lab) *cepheid single result test* Anterior Nasal Swab     Status: None   Collection Time: 04/12/22  6:23 AM   Specimen: Anterior Nasal Swab  Result Value Ref Range Status   SARS Coronavirus 2 by RT PCR NEGATIVE NEGATIVE Final    Comment: (NOTE) SARS-CoV-2 target nucleic acids are NOT DETECTED.  The SARS-CoV-2 RNA is generally detectable in upper and lower respiratory specimens during the acute phase of infection. The lowest concentration of SARS-CoV-2 viral copies this assay can detect is 250 copies / mL. A negative result does not preclude SARS-CoV-2 infection and should not be used as  the sole basis for treatment or other patient management decisions.  A negative result may occur with improper specimen collection / handling, submission of specimen other than nasopharyngeal swab, presence of viral mutation(s) within the areas targeted by this assay, and inadequate number of viral copies (<250 copies / mL). A negative result must be combined with clinical observations, patient history, and epidemiological information.  Fact Sheet for  Patients:   https://www.patel.info/  Fact Sheet for Healthcare Providers: https://hall.com/  This test is not yet approved or  cleared by the [place] FDA and has been authorized for detection and/or diagnosis of SARS-CoV-2 by FDA under an Emergency Use Authorization (EUA).  This EUA will remain in effect (meaning this test can be used) for the duration of the COVID-19 declaration under Section 564(b)(1) of the Act, 21 U.S.C. section 360bbb-3(b)(1), unless the authorization is terminated or revoked sooner.  Performed at [facility], [address] [place], [place] [address]   Culture, blood (routine x 2)     Status: None   Collection Time: 04/12/22  6:32 AM   Specimen: BLOOD  Result Value Ref Range Status   Specimen Description BLOOD LEFT ARM  Final   Special Requests   Final    BOTTLES DRAWN AEROBIC ONLY Blood Culture adequate volume   Culture   Final    NO GROWTH 5 DAYS Performed at [facility], [address] [place],  [address]    Report Status 04/17/2022 FINAL  Final    RADIOLOGY STUDIES/RESULTS: No results found.   LOS: 6 days   Oren Binet, MD  [facility]    To contact the attending provider between 7A-7P or the covering provider during after hours 7P-7A, please log into the web site [email or web address] and access using universal [place] password for that web site. If you do not have the password, please call the hospital operator.  04/18/2022, 12:46 PM

## 2022-04-19 DIAGNOSIS — J9600 Acute respiratory failure, unspecified whether with hypoxia or hypercapnia: Secondary | ICD-10-CM | POA: Diagnosis not present

## 2022-04-19 DIAGNOSIS — R6520 Severe sepsis without septic shock: Secondary | ICD-10-CM | POA: Diagnosis not present

## 2022-04-19 DIAGNOSIS — A419 Sepsis, unspecified organism: Secondary | ICD-10-CM | POA: Diagnosis not present

## 2022-04-19 LAB — GLUCOSE, CAPILLARY
Glucose-Capillary: 101 mg/dL — ABNORMAL HIGH (ref 70–99)
Glucose-Capillary: 99 mg/dL (ref 70–99)
Glucose-Capillary: 177 mg/dL — ABNORMAL HIGH (ref 70–99)
Glucose-Capillary: 205 mg/dL — ABNORMAL HIGH (ref 70–99)
Glucose-Capillary: 170 mg/dL — ABNORMAL HIGH (ref 70–99)

## 2022-04-19 LAB — BASIC METABOLIC PANEL
Anion gap: 12 (ref 5–15)
BUN: 98 mg/dL — ABNORMAL HIGH (ref 8–23)
CO2: 32 mmol/L (ref 22–32)
Calcium: 8.4 mg/dL — ABNORMAL LOW (ref 8.9–10.3)
Chloride: 98 mmol/L (ref 98–111)
Creatinine, Ser: 3.36 mg/dL — ABNORMAL HIGH (ref 0.61–1.24)
GFR, Estimated: 19 mL/min — ABNORMAL LOW (ref >60)
Glucose, Bld: 86 mg/dL (ref 70–99)
Potassium: 5.4 mmol/L — ABNORMAL HIGH (ref 3.5–5.1)
Sodium: 142 mmol/L (ref 135–145)

## 2022-04-19 MED ORDER — INSULIN ASPART 100 UNIT/ML IJ SOLN
0.0000 [IU] | Freq: Three times a day (TID) | INTRAMUSCULAR | Status: DC
  Administered 2022-04-19: 7 [IU] via SUBCUTANEOUS
  Administered 2022-04-19: 4 [IU] via SUBCUTANEOUS

## 2022-04-19 MED ORDER — SODIUM ZIRCONIUM CYCLOSILICATE 10 G PO PACK
10.0000 g | PACK | Freq: Three times a day (TID) | ORAL | Status: DC
  Administered 2022-04-19 – 2022-04-20 (×5): 10 g via ORAL
  Filled 2022-04-19 (×5): qty 1

## 2022-04-19 NOTE — Progress Notes (Addendum)
Pharmacy Antibiotic Note  Eric Scott is a 71 y.o. male admitted on 04/12/2022 with    osteomyelitis of left ankle s/p debridement and removal of hardware on 03/28/22 . He was previously on daptomycin and meropenem via OPAT. He has a history of infection from recently recovered acinetobacter/morganella/enterococcus faecalis/MRSA from his wound culture from 10/25. He presented to the ED with SOB and hypoxia and was readmitted. Pharmacy has been consulted to resume Zyvox/Merrem.  His Scr is up to 3.36, still above recent baseline. Completed a dose of oritavancin on 11/12. Will continue linezolid 10/25-12/06 to complete 6 weeks of therapy per ID recommendations.  Plan: X1 Oritavancin 11/14 Resume Zyvox 600 mg every 12 hours on 11/25 Continue Merrem 1000 mg IV every 12 hours Follow up renal function for meropenem adjustments if needed  Trend WBC, fever, renal function   Height: 6' (182.9 cm) Weight: 125.9 kg (277 lb 9 oz) IBW/kg (Calculated) : 77.6  Temp (24hrs), Avg:97.9 F (36.6 C), Min:97.6 F (36.4 C), Max:98.2 F (36.8 C)  Recent Labs  Lab 04/13/22 0233 04/14/22 0129 04/15/22 0417 04/16/22 0339 04/17/22 0450 04/18/22 0233 04/19/22 0258  WBC 11.2* 11.7* 12.6* 11.9* 12.8*  --   --   CREATININE 3.33* 3.70* 3.36* 3.45* 3.34* 3.36* 3.36*    Estimated Creatinine Clearance: 27.6 mL/min (A) (by C-G formula based on SCr of 3.36 mg/dL (H)).    Allergies  Allergen Reactions   Baclofen     unknown   Blueberry Flavor Other (See Comments)    Unknown   Cucumber Extract Other (See Comments)    "Fells like I'm having a heart attack"   Flexeril [Cyclobenzaprine Hcl] Other (See Comments)    "whole body  Tremors" (05/27/2012)   Kiwi Extract Other (See Comments)    "feels like I'm having a heart attack"    Antimicrobials this admission: Zyvox 11/09 >> (12/06) Merrem 10/27 >> (12/06)  Dapto 10/27 >> 11/08 Zyvox 10/25 >> 10/27   Microbiology results: 11/9 Bcx: NGTD 10/25  Wound Cx: acinetobacter/morganella/e. faecalis/MRSA   Thank you for allowing pharmacy to be a part of this patient's care.  Ardyth Harps, PharmD Clinical Pharmacist

## 2022-04-19 NOTE — Progress Notes (Signed)
Physical Therapy Discharge Patient Details Name: Eric Scott MRN: [id number] DOB: 05-22-1951 Today's Date: 04/19/2022 Time:  -     Patient discharged from PT services. On further review of prior encounters of chart pt has been at facility for at least 6 years and non ambulatory. 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 Recommend continued use of lift for transfers.  Shary Decamp [facility] 04/19/2022, 4:29 PM  [place] Office [phone]

## 2022-04-19 NOTE — Progress Notes (Signed)
Speech Language Pathology Treatment: Dysphagia  Patient Details Name: VEASNA SANTIBANEZ MRN: [id number] DOB: May 05, 1951 Today's Date: 04/19/2022 Time: 1735-6701 SLP Time Calculation (min) (ACUTE ONLY): 23 min  Assessment / Plan / Recommendation Clinical Impression  Pt seen for f/u dysphagia tx with pt given varying amounts of thin, puree and solids with adequate mastication, slightly delayed swallow initiation and delayed throat clearing with larger boluses of thin liquids via straw.  Discussed breathing/swallowing reciprocity techniques and rest breaks for energy conservation during meals.  Pt conveyed to SLP he suffers from chronic pain in his back which intermittently impacts upright positioning during meals.  Pt given min verbal cues for intake of thin via straw with guided sips and then independent use of straw with small sips observed by pt.  Delayed throat clearing occurred only with larger boluses of thin, but eliminated when smaller bolus implemented.  Discussed need for objective assessment prn if recurrent PNA occurs to r/o silent aspiration.  Pt agreeable.  Continue current diet of Regular/thin liquids and implementation of swallowing strategies discussed above during acute stay and f/u at SNF prn with ST.  ST will s/o in this setting.     HPI HPI: Patient is a 71 y.o. male with history of COPD on 3-4 L of oxygen at home, HFpEF, DM-2, CKD stage IV-who was recently hospitalized (10/25-11/8) for left ankle osteomyelitis-s/p hardware removal and debridement (polymicrobial infection with Acinetobacter/Morganella/Enterococcus faecalis/Staph aureus)-hospital course was complicated by worsening respiratory failure requiring BiPAP in the setting of volume overload/COPD exacerbation-patient was stabilized-and subsequently discharged to SNF on 11/8,. Pt returned to [facility] ED on 11/9 with worsening hypoxemia-and was found to have new left upper lobe PNA; ST f/u for dysphagia tx and need for objective  assessment.      SLP Plan  Discharge SLP treatment due to (comment) (goals met)      Recommendations for follow up therapy are one component of a multi-disciplinary discharge planning process, led by the attending physician.  Recommendations may be updated based on patient status, additional functional criteria and insurance authorization.    Recommendations  Diet recommendations: Regular;Thin liquid Liquids provided via: Cup;Straw (small sips) Medication Administration: Whole meds with liquid (as tolerated; if respiratory/breathing reciprocity arises, may consider meds whole in puree or single pills with liquids) Supervision: Patient able to self feed;Intermittent supervision to cue for compensatory strategies Compensations: Slow rate;Small sips/bites;Follow solids with liquid Postural Changes and/or Swallow Maneuvers: Seated upright 90 degrees                Oral Care Recommendations: Oral care BID Follow Up Recommendations: Skilled nursing-short term rehab (<3 hours/day) Assistance recommended at discharge: Intermittent Supervision/Assistance SLP Visit Diagnosis: Dysphagia, unspecified (R13.10) Plan: Discharge SLP treatment due to (comment) (goals met)           Pat Patrece Tallie,M.S., CCC-SLP  04/19/2022, 12:22 PM

## 2022-04-19 NOTE — Progress Notes (Signed)
PROGRESS NOTE        PATIENT DETAILS Name: Eric Scott Age: 71 y.o. Sex: male Date of Birth: 1951/05/12 Admit Date: 04/12/2022 Admitting Physician Deatra James, MD JOA:[id number], Fara Olden, MD  Brief Summary: Patient is a 71 y.o.  male with history of COPD on 3-4 L of oxygen at home, HFpEF, DM-2, CKD stage IV-who was recently hospitalized (10/25-11/8) for left ankle osteomyelitis-s/p hardware removal and debridement (polymicrobial infection with Acinetobacter/Morganella/Enterococcus faecalis/Staph aureus)-hospital course was complicated by worsening respiratory failure requiring BiPAP in the setting of volume overload/COPD exacerbation-patient was stabilized-and subsequently discharged to SNF on 11/8, unfortunately patient returned to [facility] ED on 11/9 with worsening hypoxemia-and was found to have new left upper lobe PNA and admitted to the hospitalist service.  See below for further details.   Significant events: 10/25-11/8>> hospitalization for left ankle osteomyelitis-s/p hardware removal-developed worsening hypoxemia due to COPD exacerbation/CHF and A-fib RVR.  Stabilized-ultimately discharged to SNF. 11/9>> presented to [facility] ED from SNF for worsening shortness of breath-CXR with left upper lobe PNA.  Transferred to [facility].  Significant studies: 11/10>> CT chest: Large left upper lobe opacity-likely PNA.  Significant microbiology data: 10/25>> left ankle wound: Morganella/Acinetobacter/Enterococcus faecalis/MRSA/VRE 11/9>> COVID PCR: Negative 11/9>> blood culture: No growth  Procedures: 10/25>> left ankle hardware removal by Dr. Sharol Given.  Consults: None  Subjective: No major issues overnight.  Lying comfortably in bed.  Objective: Vitals: Blood pressure 128/62, pulse 66, temperature 98.2 F (36.8 C), temperature source Oral, resp. rate 13, height 6' (1.829 m), weight 125.9 kg, SpO2 100 %.   Exam: Gen Exam:Alert awake-not in any distress HEENT:atraumatic,  normocephalic Chest: B/L clear to auscultation anteriorly CVS:S1S2 regular Abdomen:soft non tender, non distended Extremities:+ edema Neurology: Non focal Skin: no rash  Pertinent Labs/Radiology:    Latest Ref Rng & Units 04/17/2022    4:50 AM 04/16/2022    3:39 AM 04/15/2022    4:17 AM  CBC  WBC 4.0 - 10.5 K/uL 12.8  11.9  12.6   Hemoglobin 13.0 - 17.0 g/dL 8.5  8.5  8.7   Hematocrit 39.0 - 52.0 % 27.1  25.8  26.9   Platelets 150 - 400 K/uL 223  243  259     Lab Results  Component Value Date   NA 142 04/19/2022   K 5.4 (H) 04/19/2022   CL 98 04/19/2022   CO2 32 04/19/2022      Assessment/Plan: Acute on chronic hypoxic respiratory failure 2/2 Left upper lobe PNA/COPD exacerbation Significant improvement with IV antibiotics-back on his usual regimen of 3-4 L  Discussed with ID MD-Dr. Candiss Norse on 11/13-scenario more consistent with infection/aspirationpneumonia (predominantly left upper lobe infiltrate)-ID feels that patient could have pneumonitis secondary to daptomycin.  ID discontinued daptomycin on 11/14.  Continue meropenem  Severe sepsis Due to left upper lobe PNA Sepsis physiology resolved Cultures negative to date. See above regarding antibiotics  Polymicrobial left ankle osteomyelitis-s/p debridement/hardware removal by Dr. Sharol Given on 10/25 Wound VAC in place Appreciate Dr. Sharol Given input Wound VAC in place No longer on daptomycin-due to concern for possible pneumonitis (see above)-received 1 dose of oritavancin on 11/14-Zyvox to be resumed on 11/25-remains on meropenem.  Planned end date of antibiotics 12/6.  COPD exacerbation Significantly better  Continue bronchodilators  Completed tapering prednisone.  AKI on CKD stage IV AKI likely hemodynamically mediated Improving with supportive care  Creatinine  seems to have plateaued Volume status better after starting Demadex Great urine output but continues to continues to have hyperkalemia in spite of being on  Demadex/Lokelma.  See below.  Hyperkalemia Mild-reoccurred this morning in spite of being on Lokelma twice daily and Demadex daily Will change Lokelma to 3 times daily dosing Repeat electrolytes tomorrow. Discussed with nephrologist-Dr. Carolin Sicks over the phone-medication changes as above-reassess tomorrow.  Acute on chronic HFpEF Volume status stable-diuretics resumed on 11/15.  Paroxysmal atrial flutter In and out of A-fib/flutter Rate controlled with amiodarone/metoprolol On Eliquis Continue telemetry monitoring  Normocytic anemia Combination of CKD/acute illness No evidence of blood loss Follow CBC closely-on Eliquis.  HTN BP stable Continue amlodipine, metoprolol Follow/adjust  DM-2 (A1c 5.8 on 10/25) CBGs stable but fluctuating-prednisone being tapered down Continue Semglee 20 units daily and SSI   Recent Labs    04/18/22 2118 04/19/22 0644 04/19/22 0807  GLUCAP 181* 101* 99     Gout Not in flare Continue colchicine  BPH Continue Flomax Frequent bladder scans to ensure no retention given fluctuating renal function.  Chronic peripheral neuropathy/chronic back pain Continue as needed narcotics/Neurontin  Mood disorder Stable-continue Prozac/Xanax  1.6 cm left thyroid nodule Incidental finding on CT chest Further work-up deferred to outpatient setting.  7 mm left lower lobe nodule Incidental finding on CT chest. Repeat CT chest 6-12 months.  4.3 cm ascending thoracic aneurysm Incidental finding on CT chest Annual imaging by CTA/MRA recommended.  Morbid Obesity: Estimated body mass index is 37.64 kg/m as calculated from the following:   Height as of this encounter: 6' (1.829 m).   Weight as of this encounter: 125.9 kg.   Code status:   Code Status: Full Code   DVT Prophylaxis: TED hose Start: 04/12/22 0815 SCDs Start: 04/12/22 0815 apixaban (ELIQUIS) tablet 5 mg    Family Communication:  Son-Clevland Gerling-[phone]-on 11/10 left  voicemail Daughter-[name]-[phone]-on 11/11-left voicemail   Disposition Plan: Status is: Inpatient Remains inpatient appropriate because: Improving sepsis physiology/hypoxemia-not yet stable for discharge.   Planned Discharge Destination:Skilled nursing facility on 11/17 if potassium remain stable.   Diet: Diet Order             Diet renal/carb modified with fluid restriction Diet-HS Snack? Nothing; Fluid restriction: 1500 mL Fluid; Room service appropriate? No; Fluid consistency: Thin  Diet effective now                     Antimicrobial agents: Anti-infectives (From admission, onward)    Start     Dose/Rate Route Frequency Ordered Stop   04/28/22 1000  linezolid (ZYVOX) tablet 600 mg        600 mg Oral Every 12 hours 04/17/22 1036 05/10/22 0959   04/17/22 1300  Oritavancin Diphosphate (ORBACTIV) 1,200 mg in dextrose 5 % IVPB        1,200 mg 333.3 mL/hr over 180 Minutes Intravenous Once 04/17/22 1036 04/17/22 1746   04/13/22 1600  linezolid (ZYVOX) tablet 600 mg  Status:  Discontinued        600 mg Oral Every 12 hours 04/13/22 1514 04/17/22 1036   04/12/22 1000  meropenem (MERREM) injection 1 g  Status:  Discontinued       Note to Pharmacy: Indication:  Polymicrobial L-ankle osteo First Dose: Yes Last Day of Therapy:  05/09/22 Labs - Once weekly:  CBC/D and BMP, Labs - Every other week:  ESR and CRP Rate: 200 mL/hour     1 g Intravenous Every 12 hours 04/12/22 0824  04/12/22 0857   04/12/22 1000  linezolid (ZYVOX) IVPB 600 mg  Status:  Discontinued        600 mg 300 mL/hr over 60 Minutes Intravenous Every 12 hours 04/12/22 0834 04/13/22 1514   04/12/22 1000  meropenem (MERREM) 1 g in sodium chloride 0.9 % 100 mL IVPB        1 g 200 mL/hr over 30 Minutes Intravenous Every 12 hours 04/12/22 0857     04/12/22 0630  vancomycin (VANCOCIN) IVPB 1000 mg/200 mL premix        1,000 mg 200 mL/hr over 60 Minutes Intravenous  Once 04/12/22 0616 04/12/22 0859   04/12/22  0630  ceFEPIme (MAXIPIME) 2 g in sodium chloride 0.9 % 100 mL IVPB        2 g 200 mL/hr over 30 Minutes Intravenous  Once 04/12/22 0616 04/12/22 0757        MEDICATIONS: Scheduled Meds:  ALPRAZolam  0.5 mg Oral QHS   amiodarone  200 mg Oral Daily   amLODipine  10 mg Oral Daily   apixaban  5 mg Oral BID   arformoterol  15 mcg Nebulization BID   budesonide (PULMICORT) nebulizer solution  0.25 mg Nebulization BID   Chlorhexidine Gluconate Cloth  6 each Topical Daily   colchicine  0.3 mg Oral Daily   ferrous sulfate  325 mg Oral BID   FLUoxetine  40 mg Oral Daily   gabapentin  300 mg Oral QHS   HYDROcodone-acetaminophen  1 tablet Oral QID   insulin aspart  0-20 Units Subcutaneous TID AC & HS   insulin glargine-yfgn  20 Units Subcutaneous Daily   [START ON 04/28/2022] linezolid  600 mg Oral Q12H   magnesium oxide  400 mg Oral Daily   metoprolol tartrate  25 mg Oral BID   polyethylene glycol  17 g Oral Daily   revefenacin  175 mcg Nebulization Daily   senna  2 tablet Oral BID   sodium chloride flush  3 mL Intravenous Q12H   sodium chloride flush  3 mL Intravenous Q12H   sodium zirconium cyclosilicate  10 g Oral TID   tamsulosin  0.4 mg Oral Q supper   torsemide  80 mg Oral Daily   Continuous Infusions:  sodium chloride     meropenem (MERREM) IV Stopped (04/19/22 1000)   PRN Meds:.sodium chloride, acetaminophen **OR** acetaminophen, albuterol, bisacodyl, hydrALAZINE, HYDROmorphone (DILAUDID) injection, ondansetron **OR** ondansetron (ZOFRAN) IV, oxyCODONE, senna-docusate, sodium chloride flush, sodium chloride flush, sodium phosphate, traZODone   I have personally reviewed following labs and imaging studies  LABORATORY DATA: CBC: Recent Labs  Lab 04/13/22 0233 04/14/22 0129 04/15/22 0417 04/16/22 0339 04/17/22 0450  WBC 11.2* 11.7* 12.6* 11.9* 12.8*  HGB 9.2* 9.2* 8.7* 8.5* 8.5*  HCT 28.2* 27.9* 26.9* 25.8* 27.1*  MCV 92.2 93.0 92.1 93.8 95.8  PLT 270 265 259 243  223     Basic Metabolic Panel: Recent Labs  Lab 04/15/22 0417 04/16/22 0339 04/17/22 0450 04/18/22 0233 04/19/22 0258  NA 140 139 141 138 142  K 4.2 4.9 5.0 5.3* 5.4*  CL 97* 97* 100 98 98  CO2 31 31 32 30 32  GLUCOSE 210* 221* 156* 156* 86  BUN 109* 107* 105* 101* 98*  CREATININE 3.36* 3.45* 3.34* 3.36* 3.36*  CALCIUM 8.2* 8.3* 8.4* 8.1* 8.4*     GFR: Estimated Creatinine Clearance: 27.6 mL/min (A) (by C-G formula based on SCr of 3.36 mg/dL (H)).  Liver Function Tests: No results for input(s): "  AST", "ALT", "ALKPHOS", "BILITOT", "PROT", "ALBUMIN" in the last 168 hours.  No results for input(s): "LIPASE", "AMYLASE" in the last 168 hours. No results for input(s): "AMMONIA" in the last 168 hours.  Coagulation Profile: Recent Labs  Lab 04/13/22 0233  INR 1.4*     Cardiac Enzymes: No results for input(s): "CKTOTAL", "CKMB", "CKMBINDEX", "TROPONINI" in the last 168 hours.  BNP (last 3 results) No results for input(s): "PROBNP" in the last 8760 hours.  Lipid Profile: No results for input(s): "CHOL", "HDL", "LDLCALC", "TRIG", "CHOLHDL", "LDLDIRECT" in the last 72 hours.  Thyroid Function Tests: No results for input(s): "TSH", "T4TOTAL", "FREET4", "T3FREE", "THYROIDAB" in the last 72 hours.  Anemia Panel: No results for input(s): "VITAMINB12", "FOLATE", "FERRITIN", "TIBC", "IRON", "RETICCTPCT" in the last 72 hours.  Urine analysis:    Component Value Date/Time   COLORURINE YELLOW 05/11/2020 2100   APPEARANCEUR HAZY (A) 05/11/2020 2100   LABSPEC 1.012 05/11/2020 2100   PHURINE 5.0 05/11/2020 2100   GLUCOSEU NEGATIVE 05/11/2020 2100   HGBUR NEGATIVE 05/11/2020 2100   BILIRUBINUR NEGATIVE 05/11/2020 2100   KETONESUR NEGATIVE 05/11/2020 2100   PROTEINUR 30 (A) 05/11/2020 2100   UROBILINOGEN 0.2 03/04/2015 1835   NITRITE NEGATIVE 05/11/2020 2100   LEUKOCYTESUR MODERATE (A) 05/11/2020 2100    Sepsis Labs: Lactic Acid, Venous    Component Value Date/Time    LATICACIDVEN 1.1 04/12/2022 2637    MICROBIOLOGY: Recent Results (from the past 240 hour(s))  Culture, blood (routine x 2)     Status: None   Collection Time: 04/12/22  5:31 AM   Specimen: BLOOD  Result Value Ref Range Status   Specimen Description BLOOD CENTRAL LINE  Final   Special Requests   Final    BOTTLES DRAWN AEROBIC AND ANAEROBIC Blood Culture adequate volume   Culture   Final    NO GROWTH 5 DAYS Performed at [facility], [address], [place], [place] [address]    Report Status 04/17/2022 FINAL  Final  SARS Coronavirus 2 by RT PCR (hospital order, performed in [facility] hospital lab) *cepheid single result test* Anterior Nasal Swab     Status: None   Collection Time: 04/12/22  6:23 AM   Specimen: Anterior Nasal Swab  Result Value Ref Range Status   SARS Coronavirus 2 by RT PCR NEGATIVE NEGATIVE Final    Comment: (NOTE) SARS-CoV-2 target nucleic acids are NOT DETECTED.  The SARS-CoV-2 RNA is generally detectable in upper and lower respiratory specimens during the acute phase of infection. The lowest concentration of SARS-CoV-2 viral copies this assay can detect is 250 copies / mL. A negative result does not preclude SARS-CoV-2 infection and should not be used as the sole basis for treatment or other patient management decisions.  A negative result may occur with improper specimen collection / handling, submission of specimen other than nasopharyngeal swab, presence of viral mutation(s) within the areas targeted by this assay, and inadequate number of viral copies (<250 copies / mL). A negative result must be combined with clinical observations, patient history, and epidemiological information.  Fact Sheet for Patients:   https://www.patel.info/  Fact Sheet for Healthcare Providers: https://hall.com/  This test is not yet approved or  cleared by the [place] FDA and has been authorized for detection and/or  diagnosis of SARS-CoV-2 by FDA under an Emergency Use Authorization (EUA).  This EUA will remain in effect (meaning this test can be used) for the duration of the COVID-19 declaration under Section 564(b)(1) of the Act, 21  U.S.C. section 360bbb-3(b)(1), unless the authorization is terminated or revoked sooner.  Performed at [facility], [address], [place], [place] [address]   Culture, blood (routine x 2)     Status: None   Collection Time: 04/12/22  6:32 AM   Specimen: BLOOD  Result Value Ref Range Status   Specimen Description BLOOD LEFT ARM  Final   Special Requests   Final    BOTTLES DRAWN AEROBIC ONLY Blood Culture adequate volume   Culture   Final    NO GROWTH 5 DAYS Performed at [facility], [address], [place], [place] [address]    Report Status 04/17/2022 FINAL  Final    RADIOLOGY STUDIES/RESULTS: No results found.   LOS: 7 days   Oren Binet, MD  [facility]    To contact the attending provider between 7A-7P or the covering provider during after hours 7P-7A, please log into the web site [email or web address] and access using universal [place] password for that web site. If you do not have the password, please call the hospital operator.  04/19/2022, 11:50 AM

## 2022-04-20 DIAGNOSIS — I4892 Unspecified atrial flutter: Secondary | ICD-10-CM | POA: Diagnosis not present

## 2022-04-20 DIAGNOSIS — A419 Sepsis, unspecified organism: Secondary | ICD-10-CM | POA: Diagnosis not present

## 2022-04-20 DIAGNOSIS — K219 Gastro-esophageal reflux disease without esophagitis: Secondary | ICD-10-CM | POA: Diagnosis not present

## 2022-04-20 DIAGNOSIS — M86172 Other acute osteomyelitis, left ankle and foot: Secondary | ICD-10-CM | POA: Diagnosis not present

## 2022-04-20 LAB — BASIC METABOLIC PANEL
Anion gap: 9 (ref 5–15)
BUN: 98 mg/dL — ABNORMAL HIGH (ref 8–23)
CO2: 31 mmol/L (ref 22–32)
Calcium: 8.1 mg/dL — ABNORMAL LOW (ref 8.9–10.3)
Chloride: 101 mmol/L (ref 98–111)
Creatinine, Ser: 3.34 mg/dL — ABNORMAL HIGH (ref 0.61–1.24)
GFR, Estimated: 19 mL/min — ABNORMAL LOW (ref >60)
Glucose, Bld: 93 mg/dL (ref 70–99)
Potassium: 5.1 mmol/L (ref 3.5–5.1)
Sodium: 141 mmol/L (ref 135–145)

## 2022-04-20 LAB — GLUCOSE, CAPILLARY
Glucose-Capillary: 100 mg/dL — ABNORMAL HIGH (ref 70–99)
Glucose-Capillary: 103 mg/dL — ABNORMAL HIGH (ref 70–99)
Glucose-Capillary: 149 mg/dL — ABNORMAL HIGH (ref 70–99)

## 2022-04-20 MED ORDER — SODIUM ZIRCONIUM CYCLOSILICATE 10 G PO PACK: 10.0000 g | PACK | Freq: Three times a day (TID) | ORAL | Status: DC

## 2022-04-20 MED ORDER — ALPRAZOLAM 0.5 MG PO TABS: 0.5000 mg | ORAL_TABLET | Freq: Every day | ORAL | 0 refills | Status: DC

## 2022-04-20 MED ORDER — HYDROCODONE-ACETAMINOPHEN 7.5-325 MG PO TABS: 1.0000 | ORAL_TABLET | Freq: Four times a day (QID) | ORAL | 0 refills | Status: DC

## 2022-04-20 MED ORDER — LINEZOLID 600 MG PO TABS: 600.0000 mg | ORAL_TABLET | Freq: Two times a day (BID) | ORAL | 0 refills | Status: AC

## 2022-04-20 MED ORDER — TIOTROPIUM BROMIDE MONOHYDRATE 18 MCG IN CAPS: 18.0000 ug | ORAL_CAPSULE | Freq: Every day | RESPIRATORY_TRACT | 2 refills | Status: DC

## 2022-04-20 MED ORDER — ALBUTEROL SULFATE (2.5 MG/3ML) 0.083% IN NEBU: 2.5000 mg | INHALATION_SOLUTION | Freq: Four times a day (QID) | RESPIRATORY_TRACT | 12 refills | Status: DC | PRN

## 2022-04-20 MED ORDER — MEROPENEM 1 G IV SOLR: 1.0000 g | Freq: Two times a day (BID) | INTRAVENOUS | 0 refills | Status: DC

## 2022-04-20 MED ORDER — TORSEMIDE 20 MG PO TABS: 80.0000 mg | ORAL_TABLET | Freq: Every day | ORAL | Status: DC

## 2022-04-20 MED ORDER — ALBUTEROL SULFATE (2.5 MG/3ML) 0.083% IN NEBU: 2.5000 mg | INHALATION_SOLUTION | Freq: Three times a day (TID) | RESPIRATORY_TRACT | 12 refills | Status: DC

## 2022-04-20 MED ORDER — MEROPENEM 1 G IV SOLR: 1.0000 g | Freq: Two times a day (BID) | INTRAVENOUS | 0 refills | Status: AC

## 2022-04-20 MED ORDER — SENNOSIDES-DOCUSATE SODIUM 8.6-50 MG PO TABS: 1.0000 | ORAL_TABLET | Freq: Every evening | ORAL | Status: DC | PRN

## 2022-04-20 NOTE — Progress Notes (Signed)
1347 Attempted to call report to [phone], no answer after being on hold for 10 mins  1600 attempted to call report again no answer and PTAR is here. Phone call was disconnected while on hold.  1608 Attempted to reach RN again and asked to speak to charge nurse no answer.

## 2022-04-20 NOTE — Discharge Summary (Addendum)
PATIENT DETAILS Name: Eric Scott Age: 71 y.o. Sex: male Date of Birth: 07-24-1950 MRN: [id number]. Admitting Physician: Deatra James, MD CZY:SAYTK, Fara Olden, MD  Admit Date: 04/12/2022 Discharge date: 04/20/2022  Recommendations for Outpatient Follow-up:  Follow up with PCP in 1-2 weeks Please obtain CMP/CBC in one week Please ensure follow-up with nephrologist-Dr. Graylon Gunning in the next 5-7 days. Incidental finding-left thyroid nodule-defer to PCP for further work-up. Incidental finding-7 mm left lower lobe nodule-defer to PCP for further work-up Incidental finding-4.3 cm ascending aortic aneurysm-annual CTA/MRA recommended-defer to PCP.  Admitted From:  SNF  Disposition: Skilled nursing facility   Discharge Condition: fair  CODE STATUS:   Code Status: Full Code   Diet recommendation:  Diet Order             Diet - low sodium heart healthy           Diet Carb Modified           Diet renal/carb modified with fluid restriction Diet-HS Snack? Nothing; Fluid restriction: 1500 mL Fluid; Room service appropriate? No; Fluid consistency: Thin  Diet effective now                    Brief Summary: Patient is a 71 y.o.  male with history of COPD on 3-4 L of oxygen at home, HFpEF, DM-2, CKD stage IV-who was recently hospitalized (10/25-11/8) for left ankle osteomyelitis-s/p hardware removal and debridement (polymicrobial infection with Acinetobacter/Morganella/Enterococcus faecalis/Staph aureus)-hospital course was complicated by worsening respiratory failure requiring BiPAP in the setting of volume overload/COPD exacerbation-patient was stabilized-and subsequently discharged to SNF on 11/8, unfortunately patient returned to [facility] ED on 11/9 with worsening hypoxemia-and was found to have new left upper lobe PNA and admitted to the hospitalist service.  See below for further details.    Significant events: 10/25-11/8>> hospitalization for left ankle osteomyelitis-s/p  hardware removal-developed worsening hypoxemia due to COPD exacerbation/CHF and A-fib RVR.  Stabilized-ultimately discharged to SNF. 11/9>> presented to EP ED from SNF for worsening shortness of breath-CXR with left upper lobe PNA.  Transferred to [facility].   Significant studies: 11/10>> CT chest: Large left upper lobe opacity-likely PNA.   Significant microbiology data: 10/25>> left ankle wound: Morganella/Acinetobacter/Enterococcus faecalis/MRSA/VRE 11/9>> COVID PCR: Negative 11/9>> blood culture: No growth   Procedures: 10/25>> left ankle hardware removal by Dr. Sharol Given.   Consults: None  Brief Hospital Course: Acute on chronic hypoxic respiratory failure 2/2 Left upper lobe PNA/COPD exacerbation Significant improvement with IV antibiotics-back on his usual regimen of 3-4 L  Discussed with ID MD-Dr. Candiss Norse on 11/13-scenario more consistent with infection/aspirationpneumonia (predominantly left upper lobe infiltrate)-ID feels that patient could have pneumonitis secondary to daptomycin.  ID discontinued daptomycin on 11/14.  Continue meropenem-Zyvox per ID.   Severe sepsis Due to left upper lobe PNA Sepsis physiology resolved Cultures negative to date. See above regarding antibiotics   Polymicrobial left ankle osteomyelitis-s/p debridement/hardware removal by Dr. Sharol Given on 10/25 Wound VAC in place Appreciate Dr. Sharol Given input Wound VAC in place No longer on daptomycin-due to concern for possible pneumonitis (see above)-received 1 dose of oritavancin on 11/14-Zyvox to be resumed on 11/25-remains on meropenem.  Planned end date of antibiotics 12/6. Please ensure follow-up with Dr. Sharol Given and infectious disease clinic.   COPD exacerbation Significantly better  Continue bronchodilators  Completed tapering prednisone.   AKI on CKD stage IV AKI likely hemodynamically mediated Improving with supportive care  Creatinine seems to have plateaued See below regarding mild hyperkalemia will need  to  follow-up with primary nephrologist Volume status much better after starting Demadex (dose changed to daily dosing)   Hyperkalemia Mild  Continue Lokelma 3 times daily and Demadex until seen by primary nephrologist.   Curb sided nephrology here on 11/16-recommendations were to continue supportive care and have patient follow-up with his primary nephrologist. Suspect patient may require initiation of HD in the near future.  Currently no indications for HD.   Acute on chronic HFpEF Volume status stable-diuretics resumed on 11/15.   Paroxysmal atrial flutter In and out of A-fib/flutter Rate controlled with amiodarone/metoprolol On Eliquis Continue telemetry monitoring   Normocytic anemia Combination of CKD/acute illness No evidence of blood loss Follow CBC closely-on Eliquis.   HTN BP stable Continue amlodipine, metoprolol Follow/adjust   DM-2 (A1c 5.8 on 10/25) CBGs stable but fluctuating-prednisone being tapered down Continue Semglee 20 units daily and SSI  Gout Not in flare Continue colchicine   BPH Continue Flomax   Chronic peripheral neuropathy/chronic back pain Continue as needed narcotics/Neurontin   Mood disorder Stable-continue Prozac/Xanax   1.6 cm left thyroid nodule Incidental finding on CT chest Further work-up deferred to outpatient setting.   7 mm left lower lobe nodule Incidental finding on CT chest. Repeat CT chest 6-12 months.   4.3 cm ascending thoracic aneurysm Incidental finding on CT chest Annual imaging by CTA/MRA recommended.   Morbid Obesity: Estimated body mass index is 37.64 kg/m as calculated from the following:   Height as of this encounter: 6' (1.829 m).   Weight as of this encounter: 125.9 kg.    Discharge Diagnoses:  Principal Problem:   Sepsis ([place]) Active Problems:   Acute on chronic respiratory failure with hypoxia ([name])   Osteomyelitis of ankle or foot, acute, left ([name])   Stage 4 chronic kidney disease ([name])    Type 2 diabetes mellitus with stage 4 chronic kidney disease, with [place]-term current use of insulin ([name])   HTN (hypertension)   Diabetic polyneuropathy associated with type 2 diabetes mellitus ([name])   PVD (peripheral vascular disease) ([name])   Generalized weakness   Critical lower limb ischemia ([name])   Arm DVT (deep venous thromboembolism), acute, right ([name])   GERD (gastroesophageal reflux disease)   Obesity, Class III, BMI 40-49.9 (morbid obesity) ([name])   Atrial flutter, paroxysmal ([name])   Chronic heart failure with preserved ejection fraction (HFpEF) ([place])   Discharge Instructions:  Activity:  As tolerated with Full fall precautions use walker/cane & assistance as needed  Discharge Instructions     Call MD for:  difficulty breathing, headache or visual disturbances   Complete by: As directed    Call MD for:  redness, tenderness, or signs of infection (pain, swelling, redness, odor or green/yellow discharge around incision site)   Complete by: As directed    Diet - low sodium heart healthy   Complete by: As directed    Diet Carb Modified   Complete by: As directed    Discharge instructions   Complete by: As directed    Follow with Primary MD  [name], MD in 1-2 weeks  Please follow-up with your primary nephrologist in the next 5-7 days  Please get a complete blood count and chemistry panel checked by your Primary MD at your next visit, and again as instructed by your Primary MD.  Get Medicines reviewed and adjusted: Please take all your medications with you for your next visit with your Primary MD  Laboratory/radiological data: Please request your Primary MD to go over  all hospital tests and procedure/radiological results at the follow up, please ask your Primary MD to get all Hospital records sent to his/her office.  In some cases, they will be blood work, cultures and biopsy results pending at the time of your discharge. Please request that your primary care M.D.  follows up on these results.  Also Note the following: If you experience worsening of your admission symptoms, develop shortness of breath, life threatening emergency, suicidal or homicidal thoughts you must seek medical attention immediately by calling 911 or calling your MD immediately  if symptoms less severe.  You must read complete instructions/literature along with all the possible adverse reactions/side effects for all the Medicines you take and that have been prescribed to you. Take any new Medicines after you have completely understood and accpet all the possible adverse reactions/side effects.   Do not drive when taking Pain medications or sleeping medications (Benzodaizepines)  Do not take more than prescribed Pain, Sleep and Anxiety Medications. It is not advisable to combine anxiety,sleep and pain medications without talking with your primary care practitioner  Special Instructions: If you have smoked or chewed Tobacco  in the last 2 yrs please stop smoking, stop any regular Alcohol  and or any Recreational drug use.  Wear Seat belts while driving.  Please note: You were cared for by a hospitalist during your hospital stay. Once you are discharged, your primary care physician will handle any further medical issues. Please note that NO REFILLS for any discharge medications will be authorized once you are discharged, as it is imperative that you return to your primary care physician (or establish a relationship with a primary care physician if you do not have one) for your post hospital discharge needs so that they can reassess your need for medications and monitor your lab values.   Discharge wound care:   Complete by: As directed    Patient will need foam boots to unload pressure from the ankle and dry dressing changes daily   Increase activity slowly   Complete by: As directed       Allergies as of 04/20/2022       Reactions   Baclofen    unknown   Blueberry Flavor Other  (See Comments)   Unknown   Cucumber Extract Other (See Comments)   "Fells like I'm having a heart attack"   Flexeril [cyclobenzaprine Hcl] Other (See Comments)   "whole body  Tremors" (05/27/2012)   Kiwi Extract Other (See Comments)   "feels like I'm having a heart attack"        Medication List     STOP taking these medications    DAPTOmycin 500 MG injection Commonly known as: CUBICIN   metolazone 2.5 MG tablet Commonly known as: ZAROXOLYN   potassium bicarbonate 25 MEQ disintegrating tablet Commonly known as: K-LYTE       TAKE these medications    acetaminophen 325 MG tablet Commonly known as: TYLENOL Take 2 tablets (650 mg total) by mouth every 6 (six) hours as needed for mild pain, fever or headache (or Fever >/= 101).   albuterol (2.5 MG/3ML) 0.083% nebulizer solution Commonly known as: PROVENTIL Take 3 mLs (2.5 mg total) by nebulization 3 (three) times daily. What changed:  when to take this reasons to take this   albuterol (2.5 MG/3ML) 0.083% nebulizer solution Commonly known as: PROVENTIL Take 3 mLs (2.5 mg total) by nebulization every 6 (six) hours as needed for wheezing or shortness of breath. What changed: You  were already taking a medication with the same name, and this prescription was added. Make sure you understand how and when to take each.   ALPRAZolam 0.5 MG tablet Commonly known as: XANAX Take 1 tablet (0.5 mg total) by mouth at bedtime.   amiodarone 200 MG tablet Commonly known as: PACERONE Continue amiodarone 200 mg BID or 8 days (16 more doses), then transition to amiodarone 200 mg daily.   amLODipine 10 MG tablet Commonly known as: NORVASC Take 1 tablet (10 mg total) by mouth daily.   apixaban 5 MG Tabs tablet Commonly known as: ELIQUIS Take 1 tablet (5 mg total) by mouth 2 (two) times daily.   ascorbic acid 500 MG tablet Commonly known as: VITAMIN C Take 500 mg by mouth 2 (two) times daily.   Breo Ellipta 100-25 MCG/ACT  Aepb Generic drug: fluticasone furoate-vilanterol Inhale 1 puff into the lungs daily.   calcitRIOL 0.25 MCG capsule Commonly known as: ROCALTROL Take 0.25 mcg by mouth daily.   calcium carbonate 500 MG chewable tablet Commonly known as: TUMS - dosed in mg elemental calcium Chew 2 tablets by mouth 2 (two) times daily.   cholecalciferol 1000 units tablet Commonly known as: VITAMIN D Take 1,000 Units by mouth daily.   coconut oil Oil Apply 1 Application topically every 6 (six) hours as needed (protection and prevention).   colchicine 0.6 MG tablet Take 0.5 tablets (0.3 mg total) by mouth daily.   cyanocobalamin 1000 MCG tablet Take 1,000 mcg by mouth daily.   ferrous sulfate 325 (65 FE) MG tablet Take 325 mg by mouth 2 (two) times daily.   FLUoxetine 40 MG capsule Commonly known as: PROZAC Take 40 mg by mouth daily.   gabapentin 300 MG capsule Commonly known as: NEURONTIN Take 1 capsule (300 mg total) by mouth at bedtime.   guaiFENesin 200 MG tablet Take 200 mg by mouth 2 (two) times daily.   HYDROcodone-acetaminophen 7.5-325 MG tablet Commonly known as: NORCO Take 1 tablet by mouth 4 (four) times daily.   insulin aspart 100 UNIT/ML injection Commonly known as: novoLOG Inject 2-10 Units into the skin See admin instructions. Sliding scale if 200-250 = 2 units, 251-300 = 4 units, 301-350 = 6 units, 351-400 = 8 units, 401-500 units = 10 units before meals and at betime   ipratropium-albuterol 0.5-2.5 (3) MG/3ML Soln Commonly known as: DUONEB Take 3 mLs by nebulization every 4 (four) hours as needed (shortness of breath / wheezing).   Lantus 100 UNIT/ML injection Generic drug: insulin glargine Inject 0.22 mLs (22 Units total) into the skin at bedtime.   linezolid 600 MG tablet Commonly known as: ZYVOX Take 1 tablet (600 mg total) by mouth every 12 (twelve) hours for 11 days. Start taking on: April 28, 2022   magnesium oxide 400 MG tablet Commonly known as:  MAG-OX Take 400 mg by mouth daily.   meropenem 1 g injection Commonly known as: MERREM Inject 1 g into the vein every 12 (twelve) hours for 19 days. Indication:  Polymicrobial L-ankle osteo First Dose: Yes Last Day of Therapy:  05/09/22 Labs - Once weekly:  CBC/D and BMP, Labs - Every other week:  ESR and CRP Rate: 200 mL/hour Fluid: Normal saline 100 ml What changed: additional instructions   metoprolol tartrate 25 MG tablet Commonly known as: LOPRESSOR Take 1 tablet (25 mg total) by mouth 2 (two) times daily.   Nutritional Supplement Liqd Take 120 mLs by mouth daily. "House supplement"   pantoprazole 40 MG  tablet Commonly known as: PROTONIX Take 1 tablet (40 mg total) by mouth daily before breakfast.   polyethylene glycol 17 g packet Commonly known as: MIRALAX / GLYCOLAX Take 17 g by mouth daily. For constipation   Pro-Stat AWC Liqd Take 30 mLs by mouth daily.   senna 8.6 MG tablet Commonly known as: SENOKOT Take 2 tablets by mouth 2 (two) times daily.   senna-docusate 8.6-50 MG tablet Commonly known as: Senokot-S Take 1 tablet by mouth at bedtime as needed for mild constipation.   sodium zirconium cyclosilicate 10 g Pack packet Commonly known as: LOKELMA Take 10 g by mouth 3 (three) times daily.   tamsulosin 0.4 MG Caps capsule Commonly known as: FLOMAX Take 1 capsule (0.4 mg total) by mouth daily after supper. What changed: when to take this   tiotropium 18 MCG inhalation capsule Commonly known as: Spiriva HandiHaler Place 1 capsule (18 mcg total) into inhaler and inhale daily.   torsemide 20 MG tablet Commonly known as: Demadex Take 4 tablets (80 mg total) by mouth daily. What changed: when to take this               Discharge Care Instructions  (From admission, onward)           Start     Ordered   04/20/22 0000  Discharge wound care:       Comments: Patient will need foam boots to unload pressure from the ankle and dry dressing changes  daily   04/20/22 0948            Contact information for follow-up providers     [name], MD Follow up in 1 week(s).   Specialty: Orthopedic Surgery Contact information: [place] [address] [phone]         [name], MD. Schedule an appointment as soon as possible for a visit in 1 week(s).   Specialty: Internal Medicine Contact information: [place] [address] [phone]         [facility] Follow up.   Specialty: Infectious Diseases Why: Hospital follow up, Office will call with date/time, If you dont hear from them,please give them a call Contact information: [place], [place] [phone]        Liana Gerold, MD. Schedule an appointment as soon as possible for a visit.   Specialty: Nephrology Contact information: [address] [place] [address] [phone]              Contact information for after-discharge care     [facility] .   Service: Skilled Nursing Contact information: [place] [address] [phone]                    Allergies  Allergen Reactions   Baclofen     unknown   Blueberry Flavor Other (See Comments)    Unknown   Cucumber Extract Other (See Comments)    "Fells like I'm having a heart attack"   Flexeril [Cyclobenzaprine Hcl] Other (See Comments)    "whole body  Tremors" (05/27/2012)   Kiwi Extract Other (See Comments)    "feels like I'm having a heart attack"     Other Procedures/Studies: US RENAL  Result Date: 04/14/2022 CLINICAL DATA:  AKI EXAM: RENAL / URINARY TRACT ULTRASOUND COMPLETE COMPARISON:  CT abdomen and pelvis  without contrast May 11, 2020, abdominal ultrasound May 12, 2020, MRCP May 11, 2020 FINDINGS: Right Kidney: Renal  measurements: 11.0 x 5.6 x 4.8 cm = volume: 154.5 mL. Slightly increased cortical echogenicity. No hydronephrosis. There is a 1.5 x 1.3 x 1.4 cm anechoic mass in the interpolar right kidney, without internal blood flow on color Doppler examination. This corresponds with the T2 hyperintense mass on prior MRCP and is favored to represent a benign cyst. Left Kidney: Renal measurements: 11.6 x 5.8 x 5.4 cm = volume: 190 mL. Slightly increased cortical echogenicity. No hydronephrosis. There is a 2.5 x 2.3 x 2.5 cm anechoic mass in the interpolar left kidney, without internal blood flow on color Doppler examination. This corresponds with a T2 hyperintense mass on prior MRCP and is favored to represent a benign cyst. Bladder: Appears normal for degree of bladder distention. Other: None. IMPRESSION: 1. No nephrolithiasis or hydronephrosis. 2. Slightly increased bilateral cortical echogenicity, which can be seen in the setting of medical renal disease. Electronically Signed   By: Beryle Flock M.D.   On: 04/14/2022 08:31   CT CHEST WO CONTRAST  Result Date: 04/12/2022 CLINICAL DATA:  Chest pain, shortness of breath. EXAM: CT CHEST WITHOUT CONTRAST TECHNIQUE: Multidetector CT imaging of the chest was performed following the standard protocol without IV contrast. RADIATION DOSE REDUCTION: This exam was performed according to the departmental dose-optimization program which includes automated exposure control, adjustment of the mA and/or kV according to patient size and/or use of iterative reconstruction technique. COMPARISON:  July 20, 2014. FINDINGS: Cardiovascular: 4.3 cm ascending thoracic aorta is noted. Atherosclerosis of thoracic aorta is noted. Normal cardiac size. No pericardial effusion. Coronary artery calcifications are noted. Mediastinum/Nodes: 1.6 cm left thyroid nodule is noted. Esophagus is unremarkable. Mildly enlarged mediastinal adenopathy is noted with the largest lymph node 16 mm in right  subcarinal region. 14 mm AP window lymph node is noted. 14 mm right paratracheal lymph node is noted. Lungs/Pleura: No pneumothorax is noted. Emphysematous disease is noted bilaterally. Small right pleural effusion is noted with adjacent subsegmental atelectasis. Large left upper lobe airspace opacity is noted concerning for pneumonia. Multiple patchy airspace opacities are noted in the left lower lobe as well as right upper lobe most concerning for atelectasis or possibly infiltrate. Grossly stable 16 mm nodule is noted in right upper lobe which can be considered benign at this point with no further follow-up required. 7 mm nodule is noted in left lower lobe best seen on image number 99 of series 4 which is not clearly visualized on prior exam. Upper Abdomen: Cholelithiasis. Musculoskeletal: No chest wall mass or suspicious bone lesions identified. IMPRESSION: Large left upper lobe airspace opacity is noted concerning for pneumonia. Multiple smaller patchy airspace opacities are noted in left lower lobe as well as the right upper lobe most concerning for atelectasis or multifocal pneumonia. Mildly enlarged mediastinal adenopathy is noted which may be inflammatory or infectious etiology, but neoplasm cannot be excluded. Follow-up chest CT in 3 months is recommended to ensure stability or resolution. Small right pleural effusion is noted with associated subsegmental atelectasis. 7 mm nodule is noted in left lower lobe. Non-contrast chest CT at 6-12 months is recommended. If the nodule is stable at time of repeat CT, then future CT at 18-24 months (from today's scan) is considered optional for low-risk patients, but is recommended for high-risk patients. This recommendation follows the consensus statement: Guidelines for Management of Incidental Pulmonary Nodules Detected on CT Images: From the Fleischner  Society 2017; Radiology 2017; [phone]. 4.3 cm ascending thoracic aortic aneurysm. Recommend annual imaging  followup by CTA or MRA. This recommendation follows 2010 ACCF/AHA/AATS/ACR/ASA/SCA/SCAI/SIR/STS/SVM Guidelines for the Diagnosis and Management of Patients with Thoracic Aortic Disease. Circulation. 2010; 121: D924-Q683. Aortic aneurysm NOS (ICD10-I71.9). 1.6 cm left thyroid nodule is noted. Recommend thyroid US. (Ref: J Am Coll Radiol. 2015 Feb;12(2): 143-50). Coronary artery calcifications are noted. Cholelithiasis. Aortic Atherosclerosis (ICD10-I70.0) and Emphysema (ICD10-J43.9). Electronically Signed   By: Marijo Conception M.D.   On: 04/12/2022 13:10   DG Chest Port 1 View  Result Date: 04/12/2022 CLINICAL DATA:  71 year old male with shortness of breath and chest pain. EXAM: PORTABLE CHEST 1 VIEW COMPARISON:  Portable chest 04/05/2022 and earlier. FINDINGS: Portable AP semi upright view at 0537 hours. Stable right IJ approach multi lumen central line. Lung volumes and mediastinal contours have not significantly changed. But there is confluent new left upper lobe pulmonary opacity, probably with a degree of consolidation. No superimposed pneumothorax or pleural effusion. Underlying coarse bilateral pulmonary interstitial opacity is stable and might be chronic. No other confluent lung opacity. Visualized tracheal air column is within normal limits. Stable visualized osseous structures. IMPRESSION: 1. New left upper lobe opacity is nonspecific but suspicious for Pneumonia. 2. Otherwise stable chest. Nonspecific pulmonary interstitial opacity which might be due to chronic lung disease. Electronically Signed   By: Genevie Ann M.D.   On: 04/12/2022 05:59   ECHOCARDIOGRAM COMPLETE  Result Date: 04/05/2022    ECHOCARDIOGRAM REPORT   Patient Name:   KEON PENDER Hartwig Date of Exam: 04/05/2022 Medical Rec #:  [id number]      Height:       72.0 in Accession #:    [id number]     Weight:       255.0 lb Date of Birth:  1951-02-12      BSA:          2.362 m Patient Age:    57 years       BP:           147/66 mmHg Patient Gender:  M              HR:           65 bpm. Exam Location:  Inpatient Procedure: 2D Echo, Color Doppler, Cardiac Doppler and Intracardiac            Opacification Agent Indications:    Atrial Fibrillation I48.91  History:        Patient has prior history of Echocardiogram examinations, most                 recent 12/19/2019. Stroke and COPD; Risk Factors:Hypertension,                 Diabetes, Dyslipidemia and Former Smoker. Chronic kidney                 disease.  Sonographer:    Darlina Sicilian RDCS Referring Phys: [place]  1. Left ventricular ejection fraction, by estimation, is 60 to 65%. The left ventricle has normal function. The left ventricle has no regional wall motion abnormalities. The left ventricular internal cavity size was mildly dilated. There is moderate concentric left ventricular hypertrophy. Left ventricular diastolic parameters are indeterminate. Elevated left ventricular end-diastolic pressure.  2. Right ventricular systolic function is normal. The right ventricular size is moderately enlarged. There is moderately elevated pulmonary artery systolic pressure. The estimated right ventricular systolic pressure is 46.2  mmHg.  3. The mitral valve is normal in structure. Trivial mitral valve regurgitation. No evidence of mitral stenosis.  4. The aortic valve is tricuspid. Aortic valve regurgitation is trivial. Aortic valve sclerosis is present, with no evidence of aortic valve stenosis.  5. The inferior vena cava is dilated in size with >50% respiratory variability, suggesting right atrial pressure of 8 mmHg. FINDINGS  Left Ventricle: Left ventricular ejection fraction, by estimation, is 60 to 65%. The left ventricle has normal function. The left ventricle has no regional wall motion abnormalities. Definity contrast agent was given IV to delineate the left ventricular  endocardial borders. The left ventricular internal cavity size was mildly dilated. There is moderate concentric  left ventricular hypertrophy. Left ventricular diastolic parameters are indeterminate. Elevated left ventricular end-diastolic pressure. Right Ventricle: The right ventricular size is moderately enlarged. No increase in right ventricular wall thickness. Right ventricular systolic function is normal. There is moderately elevated pulmonary artery systolic pressure. The tricuspid regurgitant  velocity is 3.09 m/s, and with an assumed right atrial pressure of 8 mmHg, the estimated right ventricular systolic pressure is 55.3 mmHg. Left Atrium: Left atrial size was normal in size. Right Atrium: Right atrial size was normal in size. Pericardium: There is no evidence of pericardial effusion. Mitral Valve: The mitral valve is normal in structure. Trivial mitral valve regurgitation. No evidence of mitral valve stenosis. MV peak gradient, 6.2 mmHg. Tricuspid Valve: The tricuspid valve is normal in structure. Tricuspid valve regurgitation is mild . No evidence of tricuspid stenosis. Aortic Valve: The aortic valve is tricuspid. Aortic valve regurgitation is trivial. Aortic valve sclerosis is present, with no evidence of aortic valve stenosis. Pulmonic Valve: The pulmonic valve was normal in structure. Pulmonic valve regurgitation is trivial. No evidence of pulmonic stenosis. Aorta: The aortic root is normal in size and structure. Venous: The inferior vena cava is dilated in size with greater than 50% respiratory variability, suggesting right atrial pressure of 8 mmHg. IAS/Shunts: No atrial level shunt detected by color flow Doppler.  LEFT VENTRICLE PLAX 2D LVIDd:         5.15 cm      Diastology LVIDs:         3.55 cm      LV e' medial:    3.99 cm/s LV PW:         1.35 cm      LV E/e' medial:  30.3 LV IVS:        1.50 cm      LV e' lateral:   10.40 cm/s LVOT diam:     2.20 cm      LV E/e' lateral: 11.6 LV SV:         96 LV SV Index:   41 LVOT Area:     3.80 cm  LV Volumes (MOD) LV vol d, MOD A2C: 233.0 ml LV vol d, MOD A4C:  192.0 ml LV vol s, MOD A2C: 70.6 ml LV vol s, MOD A4C: 72.1 ml LV SV MOD A2C:     162.4 ml LV SV MOD A4C:     192.0 ml LV SV MOD BP:      140.6 ml RIGHT VENTRICLE RV Basal diam:  5.20 cm RV Mid diam:    4.10 cm RV S prime:     8.55 cm/s TAPSE (M-mode): 1.9 cm LEFT ATRIUM             Index        RIGHT ATRIUM  Index LA diam:        4.20 cm 1.78 cm/m   RA Area:     15.60 cm LA Vol (A2C):   79.0 ml 33.44 ml/m  RA Volume:   31.20 ml  13.21 ml/m LA Vol (A4C):   51.8 ml 21.93 ml/m LA Biplane Vol: 67.4 ml 28.53 ml/m  AORTIC VALVE LVOT Vmax:   117.00 cm/s LVOT Vmean:  75.400 cm/s LVOT VTI:    0.252 m  AORTA Ao Root diam: 3.30 cm Ao Asc diam:  3.40 cm MITRAL VALVE                TRICUSPID VALVE MV Area (PHT): 4.86 cm     TR Peak grad:   38.2 mmHg MV Peak grad:  6.2 mmHg     TR Vmax:        309.00 cm/s MV Vmax:       1.24 m/s MV Decel Time: 156 msec     SHUNTS MV E velocity: 121.00 cm/s  Systemic VTI:  0.25 m MV A velocity: 71.80 cm/s   Systemic Diam: 2.20 cm MV E/A ratio:  1.69 Fransico Him MD Electronically signed by Fransico Him MD Signature Date/Time: 04/05/2022/2:11:52 PM    Final    DG Chest Port 1 View  Result Date: 04/05/2022 CLINICAL DATA:  Hypoxia EXAM: PORTABLE CHEST 1 VIEW COMPARISON:  Chest x-ray dated April 01, 2022 FINDINGS: Interval placement of right central venous line with tip positioned over the expected area of the upper right atrium. Cardiac and mediastinal contours are unchanged. Similar lower lung predominant heterogeneous opacities. No large pleural effusion or evidence of pneumothorax. IMPRESSION: 1. Interval placement of right central venous line with tip positioned over the expected area of the upper right atrium. 2. Similar lower lung predominant heterogeneous opacities which are likely due to pulmonary edema. Electronically Signed   By: Yetta Glassman M.D.   On: 04/05/2022 08:27   IR Fluoro Guide CV Line Right  Result Date: 04/03/2022 INDICATION: Poor venous access.  Chronic renal insufficiency. In need of durable intravenous access for antibiotic administration. EXAM: ULTRASOUND AND FLUOROSCOPIC GUIDED PLACEMENT OF TUNNELED CENTRAL VENOUS CATHETER MEDICATIONS: None ANESTHESIA/SEDATION: None FLUOROSCOPY TIME:  18 seconds (8 mGy) COMPLICATIONS: None immediate. PROCEDURE: Informed written consent was obtained from the patient after a discussion of the risks, benefits, and alternatives to treatment. Questions regarding the procedure were encouraged and answered. The right neck and chest were prepped with chlorhexidine in a sterile fashion, and a sterile drape was applied covering the operative field. Maximum barrier sterile technique with sterile gowns and gloves were used for the procedure. A timeout was performed prior to the initiation of the procedure. After the overlying soft tissues were anesthetized, a small venotomy incision was created and a micropuncture kit was utilized to access the internal jugular vein. Real-time ultrasound guidance was utilized for vascular access including the acquisition of a permanent ultrasound image documenting patency of the accessed vessel. The microwire was utilized to measure appropriate catheter length. The micropuncture sheath was exchanged for a peel-away sheath over a guidewire. A 5 [place] dual lumen tunneled central venous catheter measuring 23 cm was tunneled in a retrograde fashion from the anterior chest wall to the venotomy incision. The catheter was then placed through the peel-away sheath with tip ultimately positioned at the superior caval-atrial junction. Final catheter positioning was confirmed and documented with a spot radiographic image. The catheter aspirates and flushes normally. The catheter was flushed with appropriate volume heparin  dwells. The catheter exit site was secured with a 0-Prolene retention suture. The venotomy incision was closed with Dermabond and Steri-strips. Dressings were applied. The patient tolerated  the procedure well without immediate post procedural complication. FINDINGS: After catheter placement, the tip lies within the superior cavoatrial junction. The catheter aspirates and flushes normally and is ready for immediate use. IMPRESSION: Successful placement of 23 cm dual lumen tunneled central venous catheter via the right internal jugular vein with tip terminating at the superior caval atrial junction. The catheter is ready for immediate use. Electronically Signed   By: Sandi Mariscal M.D.   On: 04/03/2022 10:05   IR US Guide Vasc Access Right  Result Date: 04/03/2022 INDICATION: Poor venous access. Chronic renal insufficiency. In need of durable intravenous access for antibiotic administration. EXAM: ULTRASOUND AND FLUOROSCOPIC GUIDED PLACEMENT OF TUNNELED CENTRAL VENOUS CATHETER MEDICATIONS: None ANESTHESIA/SEDATION: None FLUOROSCOPY TIME:  18 seconds (8 mGy) COMPLICATIONS: None immediate. PROCEDURE: Informed written consent was obtained from the patient after a discussion of the risks, benefits, and alternatives to treatment. Questions regarding the procedure were encouraged and answered. The right neck and chest were prepped with chlorhexidine in a sterile fashion, and a sterile drape was applied covering the operative field. Maximum barrier sterile technique with sterile gowns and gloves were used for the procedure. A timeout was performed prior to the initiation of the procedure. After the overlying soft tissues were anesthetized, a small venotomy incision was created and a micropuncture kit was utilized to access the internal jugular vein. Real-time ultrasound guidance was utilized for vascular access including the acquisition of a permanent ultrasound image documenting patency of the accessed vessel. The microwire was utilized to measure appropriate catheter length. The micropuncture sheath was exchanged for a peel-away sheath over a guidewire. A 5 [place] dual lumen tunneled central venous  catheter measuring 23 cm was tunneled in a retrograde fashion from the anterior chest wall to the venotomy incision. The catheter was then placed through the peel-away sheath with tip ultimately positioned at the superior caval-atrial junction. Final catheter positioning was confirmed and documented with a spot radiographic image. The catheter aspirates and flushes normally. The catheter was flushed with appropriate volume heparin dwells. The catheter exit site was secured with a 0-Prolene retention suture. The venotomy incision was closed with Dermabond and Steri-strips. Dressings were applied. The patient tolerated the procedure well without immediate post procedural complication. FINDINGS: After catheter placement, the tip lies within the superior cavoatrial junction. The catheter aspirates and flushes normally and is ready for immediate use. IMPRESSION: Successful placement of 23 cm dual lumen tunneled central venous catheter via the right internal jugular vein with tip terminating at the superior caval atrial junction. The catheter is ready for immediate use. Electronically Signed   By: Sandi Mariscal M.D.   On: 04/03/2022 10:05   DG CHEST PORT 1 VIEW  Result Date: 04/01/2022 CLINICAL DATA:  Shortness of breath EXAM: PORTABLE CHEST 1 VIEW COMPARISON:  Chest radiograph dated 05/11/2020 FINDINGS: The heart is enlarged. Vascular calcifications are seen in the aortic arch. Moderate bilateral lower lung predominant interstitial opacities are noted. There is no pleural effusion or pneumothorax. Degenerative changes are seen in the spine. IMPRESSION: Moderate bilateral lower lung predominant interstitial opacities, which may represent pulmonary edema or atypical infection. Electronically Signed   By: Zerita Boers M.D.   On: 04/01/2022 19:15   DG MINI C-ARM IMAGE ONLY  Result Date: 03/28/2022 There is no interpretation for this exam.  This order is  for images obtained during a surgical procedure.  Please See  "Surgeries" Tab for more information regarding the procedure.   XR Ankle Complete Left  Result Date: 03/23/2022 Three-view radiographs of the left ankle shows a screw and K wire in the medial malleolus.  There is a fibular plate laterally with collapse of the tibial talar joint.  There is no definite lytic changes of the medial malleolus.  No lucency around the hardware.    TODAY-DAY OF DISCHARGE:  Subjective:   Eric Scott today has no headache,no chest abdominal pain,no new weakness tingling or numbness, feels much better wants to go home today.   Objective:   Blood pressure (!) 116/93, pulse 66, temperature 97.6 F (36.4 C), temperature source Oral, resp. rate 16, height 6' (1.829 m), weight 125.5 kg, SpO2 100 %.  Intake/Output Summary (Last 24 hours) at 04/20/2022 1117 Last data filed at 04/20/2022 0700 Gross per 24 hour  Intake --  Output 1502 ml  Net -1502 ml   Filed Weights   04/14/22 0500 04/19/22 0500 04/20/22 0500  Weight: 128.8 kg 125.9 kg 125.5 kg    Exam: Awake Alert, Oriented *3, No new F.N deficits, Normal affect [place].AT,PERRAL Supple Neck,No JVD, No cervical lymphadenopathy appriciated.  Symmetrical Chest wall movement, Good air movement bilaterally, CTAB RRR,No Gallops,Rubs or new Murmurs, No Parasternal Heave +ve B.Sounds, Abd Soft, Non tender, No organomegaly appriciated, No rebound -guarding or rigidity. No Cyanosis, Clubbing or edema, No new Rash or bruise   PERTINENT RADIOLOGIC STUDIES: No results found.   PERTINENT LAB RESULTS: CBC: No results for input(s): "WBC", "HGB", "HCT", "PLT" in the last 72 hours. CMET CMP     Component Value Date/Time   NA 141 04/20/2022 0350   K 5.1 04/20/2022 0350   CL 101 04/20/2022 0350   CO2 31 04/20/2022 0350   GLUCOSE 93 04/20/2022 0350   BUN 98 (H) 04/20/2022 0350   CREATININE 3.34 (H) 04/20/2022 0350   CALCIUM 8.1 (L) 04/20/2022 0350   CALCIUM 9.2 01/31/2021 1311   PROT 6.7 04/12/2022 0531   ALBUMIN  2.9 (L) 04/12/2022 0531   AST 12 (L) 04/12/2022 0531   ALT 12 04/12/2022 0531   ALKPHOS 62 04/12/2022 0531   BILITOT 0.7 04/12/2022 0531   GFRNONAA 19 (L) 04/20/2022 0350   GFRAA 16 (L) 12/21/2019 0633    GFR Estimated Creatinine Clearance: 27.8 mL/min (A) (by C-G formula based on SCr of 3.34 mg/dL (H)). No results for input(s): "LIPASE", "AMYLASE" in the last 72 hours. No results for input(s): "CKTOTAL", "CKMB", "CKMBINDEX", "TROPONINI" in the last 72 hours. Invalid input(s): "POCBNP" No results for input(s): "DDIMER" in the last 72 hours. No results for input(s): "HGBA1C" in the last 72 hours. No results for input(s): "CHOL", "HDL", "LDLCALC", "TRIG", "CHOLHDL", "LDLDIRECT" in the last 72 hours. No results for input(s): "TSH", "T4TOTAL", "T3FREE", "THYROIDAB" in the last 72 hours.  Invalid input(s): "FREET3" No results for input(s): "VITAMINB12", "FOLATE", "FERRITIN", "TIBC", "IRON", "RETICCTPCT" in the last 72 hours. Coags: No results for input(s): "INR" in the last 72 hours.  Invalid input(s): "PT" Microbiology: Recent Results (from the past 240 hour(s))  Culture, blood (routine x 2)     Status: None   Collection Time: 04/12/22  5:31 AM   Specimen: BLOOD  Result Value Ref Range Status   Specimen Description BLOOD CENTRAL LINE  Final   Special Requests   Final    BOTTLES DRAWN AEROBIC AND ANAEROBIC Blood Culture adequate volume   Culture  Final    NO GROWTH 5 DAYS Performed at [facility], [address] [place], [place] [address]    Report Status 04/17/2022 FINAL  Final  SARS Coronavirus 2 by RT PCR (hospital order, performed in [facility] hospital lab) *cepheid single result test* Anterior Nasal Swab     Status: None   Collection Time: 04/12/22  6:23 AM   Specimen: Anterior Nasal Swab  Result Value Ref Range Status   SARS Coronavirus 2 by RT PCR NEGATIVE NEGATIVE Final    Comment: (NOTE) SARS-CoV-2 target nucleic acids are NOT DETECTED.  The SARS-CoV-2 RNA is  generally detectable in upper and lower respiratory specimens during the acute phase of infection. The lowest concentration of SARS-CoV-2 viral copies this assay can detect is 250 copies / mL. A negative result does not preclude SARS-CoV-2 infection and should not be used as the sole basis for treatment or other patient management decisions.  A negative result may occur with improper specimen collection / handling, submission of specimen other than nasopharyngeal swab, presence of viral mutation(s) within the areas targeted by this assay, and inadequate number of viral copies (<250 copies / mL). A negative result must be combined with clinical observations, patient history, and epidemiological information.  Fact Sheet for Patients:   https://www.patel.info/  Fact Sheet for Healthcare Providers: https://hall.com/  This test is not yet approved or  cleared by the [place] FDA and has been authorized for detection and/or diagnosis of SARS-CoV-2 by FDA under an Emergency Use Authorization (EUA).  This EUA will remain in effect (meaning this test can be used) for the duration of the COVID-19 declaration under Section 564(b)(1) of the Act, 21 U.S.C. section 360bbb-3(b)(1), unless the authorization is terminated or revoked sooner.  Performed at [facility], [address], [place], [place] [address]   Culture, blood (routine x 2)     Status: None   Collection Time: 04/12/22  6:32 AM   Specimen: BLOOD  Result Value Ref Range Status   Specimen Description BLOOD LEFT ARM  Final   Special Requests   Final    BOTTLES DRAWN AEROBIC ONLY Blood Culture adequate volume   Culture   Final    NO GROWTH 5 DAYS Performed at [facility], [address], [place], [place] [address]    Report Status 04/17/2022 FINAL  Final    FURTHER DISCHARGE INSTRUCTIONS:  Get Medicines reviewed and adjusted: Please take all your medications with you for your next  visit with your Primary MD  Laboratory/radiological data: Please request your Primary MD to go over all hospital tests and procedure/radiological results at the follow up, please ask your Primary MD to get all Hospital records sent to his/her office.  In some cases, they will be blood work, cultures and biopsy results pending at the time of your discharge. Please request that your primary care M.D. goes through all the records of your hospital data and follows up on these results.  Also Note the following: If you experience worsening of your admission symptoms, develop shortness of breath, life threatening emergency, suicidal or homicidal thoughts you must seek medical attention immediately by calling 911 or calling your MD immediately  if symptoms less severe.  You must read complete instructions/literature along with all the possible adverse reactions/side effects for all the Medicines you take and that have been prescribed to you. Take any new Medicines after you have completely understood and accpet all the possible adverse reactions/side effects.   Do not drive when  taking Pain medications or sleeping medications (Benzodaizepines)  Do not take more than prescribed Pain, Sleep and Anxiety Medications. It is not advisable to combine anxiety,sleep and pain medications without talking with your primary care practitioner  Special Instructions: If you have smoked or chewed Tobacco  in the last 2 yrs please stop smoking, stop any regular Alcohol  and or any Recreational drug use.  Wear Seat belts while driving.  Please note: You were cared for by a hospitalist during your hospital stay. Once you are discharged, your primary care physician will handle any further medical issues. Please note that NO REFILLS for any discharge medications will be authorized once you are discharged, as it is imperative that you return to your primary care physician (or establish a relationship with a primary care  physician if you do not have one) for your post hospital discharge needs so that they can reassess your need for medications and monitor your lab values.  Total Time spent coordinating discharge including counseling, education and face to face time equals greater than 30 minutes.  SignedOren Binet 04/20/2022 11:17 AM

## 2022-04-20 NOTE — Progress Notes (Signed)
PHARMACY CONSULT NOTE FOR:  OUTPATIENT  PARENTERAL ANTIBIOTIC THERAPY (OPAT)  Indication: Polymicrobial L-ankle osteo  Regimen: Meropenem 1 gm Q 12 hours  End date: 05/09/22  Antibiotic rate also added to OPAT orders.  Patient also to be on linezolid from 11/25 to 12/6.   IV antibiotic discharge orders are pended. To discharging provider:  please sign these orders via discharge navigator,  Select New Orders & click on the button choice - Manage This Unsigned Work.     Thank you for allowing pharmacy to be a part of this patient's care.  Jimmy Footman, PharmD, BCPS, [place] [facility] Clinical Pharmacist Phone: [phone] 04/20/2022, 9:38 AM

## 2022-04-20 NOTE — TOC Transition Note (Signed)
Transition of Care [facility] - CM/SW Discharge Note   Patient Details  Name: Eric Scott MRN: [id number] Date of Birth: 1950-11-26  Transition of Care [facility]) CM/SW Contact:  Benard Halsted, [place] Phone Number: 04/20/2022, 12:42 PM   Clinical Narrative:    Patient will DC to: [facility] Anticipated DC date: 04/20/22 Family notified: Pt notifying Transport by: Corey Harold   Per MD patient ready for DC to [place]. RN to call report prior to discharge [phone]). RN, patient, patient's family, and facility notified of DC. Discharge Summary and FL2 sent to facility. DC packet on chart including signed scripts. IV infusion discussed with Margarito Courser and MD, and pharmacist. Ambulance transport requested for patient.   CSW will sign off for now as social work intervention is no longer needed. Please consult [place] again if new needs arise.     Final next level of care: Skilled Nursing Facility Barriers to Discharge: Barriers Resolved   Patient Goals and CMS Choice Patient states their goals for this hospitalization and ongoing recovery are:: Return to snf CMS Medicare.gov Compare Post Acute Care list provided to:: Patient Choice offered to / list presented to : Patient  Discharge Placement   Existing PASRR number confirmed : 04/20/22          Patient chooses bed at:  [facility]) Patient to be transferred to facility by: [place] Name of family member notified: pt will notify Patient and family notified of of transfer: 04/20/22  Discharge Plan and Services                                     Social Determinants of Health (SDOH) Interventions     Readmission Risk Interventions     No data to display

## 2022-04-20 NOTE — Progress Notes (Unsigned)
Office Visit    Patient Name: Eric Scott Date of Encounter: 04/23/2022  PCP:  [name], [facility]  Cardiologist:  Jenkins Rouge, MD  Advanced Practice Provider:  No care team member to display Electrophysiologist:  None   HPI    Eric Scott is a 71 y.o. male with a past medical history of anemia, atrial fibrillation, anxiety, COPD, hypertension, hyperlipidemia and CHF presents today for follow-up appointment.  He had not been seen in the clinic since 04/2019.  At that point he was essentially wheelchair-bound.  He had chronic lower extremity edema and was seen by Dr. Donzetta Matters with mild reflux in LLE.  He has had dyspnea from COPD since 2013 and had lacunar infarcts with RUE weakness.  Previous history of ulcers with GI bleeding and need for transfusion.  Last echocardiogram 12/12/2016 with normal LVEF 65 to 70% only abnormal relaxation.  Normal for age.  No significant valvular disease and no pulmonary hypertension.  Recently admitted for left upper lobe PNA/COPD exacerbation.  Significant improvement with IV antibiotics and back to his usual 3 to 4 L of oxygen.  Today, he states he is doing better from a breathing standpoint.  In fact, he is no longer requiring oxygen.  Saturation is 91% today.  He tells me he has been in a wheelchair for the last 8 years.  When he stands his legs give out.  He states that he does not have any chest pain today nor does he have fluttering in his chest.  No bleeding issues on Eliquis.  He has been compliant with all his medications.  Blood pressure is well controlled today.  Reports no shortness of breath nor dyspnea on exertion. Reports no chest pain, pressure, or tightness. No edema, orthopnea, PND. Reports no palpitations.    Past Medical History:  Diagnosis Date   Anemia    Anxiety    Chronic pain    legs, back; MRI 05/2012 with mild thoracic degenerative changes no spinal stenosis    CKD (chronic kidney  disease) 05/12/2019   Stage IV   COPD (chronic obstructive pulmonary disease) ([place])    COVID-19    Depression    Diabetic peripheral neuropathy ([name])    "chronic" (05/27/8249)   Diastolic CHF ([name])    Duodenal ulcer hemorrhage    DVT (deep venous thrombosis) ([name])    right upper arm   GERD (gastroesophageal reflux disease)    Hypercholesteremia    Hypertension    Iron deficiency anemia    Osteomyelitis of ankle ([name])    Peripheral edema    PVD (peripheral vascular disease) ([place])    Spinal stenosis    mild lumbar (MRI 05/2012)-L2-L3 to L4-L5 , mild lumbar foraminal stenosis    Stroke ([place]) 05/2012   Subacute, lacunar infarcts within the left basal ganglia and posterior limp of the left internal capsule/thalamus; "RUE; both feet weak" (05/27/2012)   Tremor    Type II diabetes mellitus ([place])    Past Surgical History:  Procedure Laterality Date   ANKLE SURGERY     APPLICATION OF WOUND VAC Left 03/28/2022   Procedure: APPLICATION OF WOUND VAC;  Surgeon: [name], MD;  Location: [place];  Service: Orthopedics;  Laterality: Left;   ESOPHAGOGASTRODUODENOSCOPY (EGD) WITH PROPOFOL N/A 12/21/2016   [place]: Ulcerative reflux esophagitis, erosive gastropathy, extensive duodenal ulceration likely site of bleeding.  Pathology with mild gastritis, no H. pylori   ESOPHAGOGASTRODUODENOSCOPY (EGD) WITH PROPOFOL N/A  04/02/2017   Fields: ESOPAHGITIS/GASTRIC AND DUODENAL ULCERS HEALED. mild duodenitis   HARDWARE REMOVAL Left 03/28/2022   Procedure: LEFT ANKLE REMOVAL DEEP HARDWARE, REMOVE BONE AND APPLY TISSUE GRAFT;  Surgeon: [name], MD;  Location: [place];  Service: Orthopedics;  Laterality: Left;   HERNIA REPAIR  01/05/2004   "belly button" (05/27/2012)   IR FLUORO GUIDE CV LINE RIGHT  04/03/2022   IR US GUIDE VASC ACCESS RIGHT  04/03/2022    Allergies  Allergies  Allergen Reactions   Baclofen     unknown   Blueberry Flavor Other (See Comments)    Unknown   Cucumber Extract Other  (See Comments)    "Fells like I'm having a heart attack"   Flexeril [Cyclobenzaprine Hcl] Other (See Comments)    "whole body  Tremors" (05/27/2012)   Kiwi Extract Other (See Comments)    "feels like I'm having a heart attack"     EKGs/Labs/Other Studies Reviewed:   The following studies were reviewed today:  Echocardiogram 04/05/22  IMPRESSIONS     1. Left ventricular ejection fraction, by estimation, is 60 to 65%. The  left ventricle has normal function. The left ventricle has no regional  wall motion abnormalities. The left ventricular internal cavity size was  mildly dilated. There is moderate  concentric left ventricular hypertrophy. Left ventricular diastolic  parameters are indeterminate. Elevated left ventricular end-diastolic  pressure.   2. Right ventricular systolic function is normal. The right ventricular  size is moderately enlarged. There is moderately elevated pulmonary artery  systolic pressure. The estimated right ventricular systolic pressure is  91.6 mmHg.   3. The mitral valve is normal in structure. Trivial mitral valve  regurgitation. No evidence of mitral stenosis.   4. The aortic valve is tricuspid. Aortic valve regurgitation is trivial.  Aortic valve sclerosis is present, with no evidence of aortic valve  stenosis.   5. The inferior vena cava is dilated in size with >50% respiratory  variability, suggesting right atrial pressure of 8 mmHg.   FINDINGS   Left Ventricle: Left ventricular ejection fraction, by estimation, is 60  to 65%. The left ventricle has normal function. The left ventricle has no  regional wall motion abnormalities. Definity contrast agent was given IV  to delineate the left ventricular   endocardial borders. The left ventricular internal cavity size was mildly  dilated. There is moderate concentric left ventricular hypertrophy. Left  ventricular diastolic parameters are indeterminate. Elevated left  ventricular end-diastolic  pressure.   Right Ventricle: The right ventricular size is moderately enlarged. No  increase in right ventricular wall thickness. Right ventricular systolic  function is normal. There is moderately elevated pulmonary artery systolic  pressure. The tricuspid regurgitant   velocity is 3.09 m/s, and with an assumed right atrial pressure of 8  mmHg, the estimated right ventricular systolic pressure is 60.6 mmHg.   Left Atrium: Left atrial size was normal in size.   Right Atrium: Right atrial size was normal in size.   Pericardium: There is no evidence of pericardial effusion.   Mitral Valve: The mitral valve is normal in structure. Trivial mitral  valve regurgitation. No evidence of mitral valve stenosis. MV peak  gradient, 6.2 mmHg.   Tricuspid Valve: The tricuspid valve is normal in structure. Tricuspid  valve regurgitation is mild . No evidence of tricuspid stenosis.   Aortic Valve: The aortic valve is tricuspid. Aortic valve regurgitation is  trivial. Aortic valve sclerosis is present, with no evidence of aortic  valve stenosis.   Pulmonic Valve: The pulmonic valve was normal in structure. Pulmonic valve  regurgitation is trivial. No evidence of pulmonic stenosis.   Aorta: The aortic root is normal in size and structure.   Venous: The inferior vena cava is dilated in size with greater than 50%  respiratory variability, suggesting right atrial pressure of 8 mmHg.   IAS/Shunts: No atrial level shunt detected by color flow Doppler.   EKG:  EKG is not ordered today.    Recent Labs: 04/12/2022: ALT 12; B Natriuretic Peptide 502.0; Magnesium 2.0 04/17/2022: Hemoglobin 8.5; Platelets 223 04/20/2022: BUN 98; Creatinine, Ser 3.34; Potassium 5.1; Sodium 141  Recent Lipid Panel    Component Value Date/Time   CHOL 145 05/28/2012 0500   TRIG 127 04/20/2019 0853   HDL 37 (L) 05/28/2012 0500   CHOLHDL 3.9 05/28/2012 0500   VLDL 24 05/28/2012 0500   LDLCALC 84 05/28/2012 0500     Risk Assessment/Calculations:   CHA2DS2-VASc Score = 5   This indicates a 7.2% annual risk of stroke. The patient's score is based upon: CHF History: 1 HTN History: 1 Diabetes History: 1 Stroke History: 0 Vascular Disease History: 1 Age Score: 1 Gender Score: 0     Home Medications   Current Meds  Medication Sig   acetaminophen (TYLENOL) 325 MG tablet Take 2 tablets (650 mg total) by mouth every 6 (six) hours as needed for mild pain, fever or headache (or Fever >/= 101).   albuterol (PROVENTIL) (2.5 MG/3ML) 0.083% nebulizer solution Take 3 mLs (2.5 mg total) by nebulization 3 (three) times daily.   albuterol (PROVENTIL) (2.5 MG/3ML) 0.083% nebulizer solution Take 3 mLs (2.5 mg total) by nebulization every 6 (six) hours as needed for wheezing or shortness of breath.   ALPRAZolam (XANAX) 0.5 MG tablet Take 1 tablet (0.5 mg total) by mouth at bedtime.   Amino Acids-Protein Hydrolys (PRO-STAT AWC) LIQD Take 30 mLs by mouth daily.   amiodarone (PACERONE) 200 MG tablet Continue amiodarone 200 mg BID or 8 days (16 more doses), then transition to amiodarone 200 mg daily.   amLODipine (NORVASC) 10 MG tablet Take 1 tablet (10 mg total) by mouth daily.   apixaban (ELIQUIS) 5 MG TABS tablet Take 1 tablet (5 mg total) by mouth 2 (two) times daily.   ascorbic acid (VITAMIN C) 500 MG tablet Take 500 mg by mouth 2 (two) times daily.   aspirin EC 81 MG tablet Take 81 mg by mouth daily. Swallow whole.   BREO ELLIPTA 100-25 MCG/INH AEPB Inhale 1 puff into the lungs daily.    calcitRIOL (ROCALTROL) 0.25 MCG capsule Take 0.25 mcg by mouth daily.   calcium carbonate (TUMS - DOSED IN MG ELEMENTAL CALCIUM) 500 MG chewable tablet Chew 2 tablets by mouth 2 (two) times daily.   cholecalciferol (VITAMIN D) 1000 units tablet Take 1,000 Units by mouth daily.   coconut oil OIL Apply 1 Application topically every 6 (six) hours as needed (protection and prevention).   colchicine 0.6 MG tablet Take 0.5 tablets  (0.3 mg total) by mouth daily.   cyanocobalamin 1000 MCG tablet Take 1,000 mcg by mouth daily.   ferrous sulfate 325 (65 FE) MG tablet Take 325 mg by mouth 2 (two) times daily.   FLUoxetine (PROZAC) 40 MG capsule Take 40 mg by mouth daily.   gabapentin (NEURONTIN) 300 MG capsule Take 1 capsule (300 mg total) by mouth at bedtime.   guaiFENesin 200 MG tablet Take 200 mg by mouth 2 (two)  times daily.   HUMALOG KWIKPEN 100 UNIT/ML KwikPen Inject into the skin.   HYDROcodone-acetaminophen (NORCO) 7.5-325 MG tablet Take 1 tablet by mouth 4 (four) times daily.   insulin aspart (NOVOLOG) 100 UNIT/ML injection Inject 2-10 Units into the skin See admin instructions. Sliding scale if 200-250 = 2 units, 251-300 = 4 units, 301-350 = 6 units, 351-400 = 8 units, 401-500 units = 10 units before meals and at betime   ipratropium-albuterol (DUONEB) 0.5-2.5 (3) MG/3ML SOLN Take 3 mLs by nebulization every 4 (four) hours as needed (shortness of breath / wheezing).   LANTUS 100 UNIT/ML injection Inject 0.22 mLs (22 Units total) into the skin at bedtime.   [START ON 04/28/2022] linezolid (ZYVOX) 600 MG tablet Take 1 tablet (600 mg total) by mouth every 12 (twelve) hours for 11 days.   magnesium oxide (MAG-OX) 400 MG tablet Take 400 mg by mouth daily.   meropenem (MERREM) 1 g injection Inject 1 g into the vein every 12 (twelve) hours for 19 days. Indication:  Polymicrobial L-ankle osteo First Dose: Yes Last Day of Therapy:  05/09/22 Labs - Once weekly:  CBC/D and BMP, Labs - Every other week:  ESR and CRP Rate: 200 mL/hour Fluid: Normal saline 100 ml   metoprolol tartrate (LOPRESSOR) 25 MG tablet Take 1 tablet (25 mg total) by mouth 2 (two) times daily.   Nutritional Supplement LIQD Take 120 mLs by mouth daily. "House supplement"   polyethylene glycol (MIRALAX / GLYCOLAX) 17 g packet Take 17 g by mouth daily. For constipation   senna (SENOKOT) 8.6 MG tablet Take 2 tablets by mouth 2 (two) times daily.    senna-docusate (SENOKOT-S) 8.6-50 MG tablet Take 1 tablet by mouth at bedtime as needed for mild constipation.   sodium zirconium cyclosilicate (LOKELMA) 10 g PACK packet Take 10 g by mouth 3 (three) times daily.   tamsulosin (FLOMAX) 0.4 MG CAPS capsule Take 1 capsule (0.4 mg total) by mouth daily after supper. (Patient taking differently: Take 0.4 mg by mouth daily.)   tiotropium (SPIRIVA HANDIHALER) 18 MCG inhalation capsule Place 1 capsule (18 mcg total) into inhaler and inhale daily.   torsemide (DEMADEX) 20 MG tablet Take 4 tablets (80 mg total) by mouth daily.     Review of Systems      All other systems reviewed and are otherwise negative except as noted above.  Physical Exam    VS:  BP (!) 110/50   Pulse 68   Ht 6' (1.829 m)   Wt 267 lb 6.4 oz (121.3 kg) Comment: "Facility"  SpO2 91%   BMI 36.27 kg/m  , BMI Body mass index is 36.27 kg/m.  Wt Readings from Last 3 Encounters:  04/23/22 267 lb 6.4 oz (121.3 kg)  04/20/22 276 lb 10.8 oz (125.5 kg)  04/11/22 285 lb 7.9 oz (129.5 kg)     GEN: Well nourished, well developed, in no acute distress. HEENT: normal. Neck: Supple, no JVD, carotid bruits, or masses. Cardiac: RRR, no murmurs, rubs, or gallops. No clubbing, cyanosis, edema.  Radials/PT 1+ and equal bilaterally.  Respiratory:  Respirations regular and unlabored, clear to auscultation bilaterally. GI: Soft, nontender, nondistended. MS: No deformity or atrophy. Skin: Warm and dry, no rash. Neuro:  Strength and sensation are intact. Psych: Normal affect.  Assessment & Plan    Afib with RVR -NSR today.  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 Sharol Given -Weight has been the same -Continue daily weights, low-sodium diet, and current diuretic regimen with Demadex 80 mg daily -kidney function stable he has chronic renal disease with creatinine 3.3  COPD -Continue current medications.  He is off oxygen  CRF -Follow-up with nephrology  Recent admission for PNA/COPD exacerbation -Follow-up with PCP tomorrow -Patient states that he is breathing much better -He will be on IV antibiotics through 05/09/2022         Disposition: Follow up 3 months with Jenkins Rouge, MD or APP.  Signed, Elgie Collard, PA-C 04/23/2022, 12:36 PM [facility]

## 2022-04-23 ENCOUNTER — Ambulatory Visit: Payer: Medicare (Managed Care) | Attending: Physician Assistant | Admitting: Physician Assistant

## 2022-04-23 ENCOUNTER — Encounter: Payer: Self-pay | Admitting: Physician Assistant

## 2022-04-23 VITALS — BP 110/50 | HR 68 | Ht 72.0 in | Wt 267.4 lb

## 2022-04-23 DIAGNOSIS — J449 Chronic obstructive pulmonary disease, unspecified: Secondary | ICD-10-CM | POA: Diagnosis not present

## 2022-04-23 DIAGNOSIS — I10 Essential (primary) hypertension: Secondary | ICD-10-CM

## 2022-04-23 DIAGNOSIS — I739 Peripheral vascular disease, unspecified: Secondary | ICD-10-CM

## 2022-04-23 DIAGNOSIS — I5043 Acute on chronic combined systolic (congestive) and diastolic (congestive) heart failure: Secondary | ICD-10-CM

## 2022-04-23 DIAGNOSIS — N189 Chronic kidney disease, unspecified: Secondary | ICD-10-CM

## 2022-04-23 NOTE — Patient Instructions (Signed)
Medication Instructions:  Your physician recommends that you continue on your current medications as directed. Please refer to the Current Medication list given to you today.  *If you need a refill on your cardiac medications before your next appointment, please call your pharmacy*   Lab Work: None If you have labs (blood work) drawn today and your tests are completely normal, you will receive your results only by: [place] (if you have MyChart) OR A paper copy in the mail If you have any lab test that is abnormal or we need to change your treatment, we will call you to review the results.   Testing/Procedures: You will be due for a CT Angio in 1 year.   Follow-Up: At [facility], you and your health needs are our priority.  As part of our continuing mission to provide you with exceptional heart care, we have created designated Provider Care Teams.  These Care Teams include your primary Cardiologist (physician) and Advanced Practice Providers (APPs -  Physician Assistants and Nurse Practitioners) who all work together to provide you with the care you need, when you need it.  We recommend signing up for the patient portal called "MyChart".  Sign up information is provided on this After Visit Summary.  MyChart is used to connect with patients for Virtual Visits (Telemedicine).  Patients are able to view lab/test results, encounter notes, upcoming appointments, etc.  Non-urgent messages can be sent to your provider as well.   To learn more about what you can do with MyChart, go to [email or web address].    Your next appointment:   3 month(s)  The format for your next appointment:   In Person  Provider:   Jenkins Rouge, MD  or Nicholes Rough, PA-C       Important Information About Sugar

## 2022-04-24 ENCOUNTER — Telehealth: Payer: Self-pay

## 2022-04-24 ENCOUNTER — Ambulatory Visit (INDEPENDENT_AMBULATORY_CARE_PROVIDER_SITE_OTHER): Payer: Medicare (Managed Care) | Admitting: Internal Medicine

## 2022-04-24 ENCOUNTER — Other Ambulatory Visit: Payer: Self-pay

## 2022-04-24 ENCOUNTER — Encounter: Payer: Self-pay | Admitting: Internal Medicine

## 2022-04-24 VITALS — BP 113/64 | HR 111 | Temp 98.4°F | Wt 270.8 lb

## 2022-04-24 DIAGNOSIS — T847XXA Infection and inflammatory reaction due to other internal orthopedic prosthetic devices, implants and grafts, initial encounter: Secondary | ICD-10-CM

## 2022-04-24 DIAGNOSIS — Z95828 Presence of other vascular implants and grafts: Secondary | ICD-10-CM

## 2022-04-24 DIAGNOSIS — R0902 Hypoxemia: Secondary | ICD-10-CM

## 2022-04-24 NOTE — Assessment & Plan Note (Addendum)
He will continue antibiotics through 05/09/22 to complete a 6 week course for OM following hardware removal on 03/28/22 with Dr Sharol Given.  He will continue meropenem 1gm Q12H as ordered per renal dosing.  He received a dose of oritavancin on 11/14 and should start taking linezolid 654m PO BID on 11/25 through 12/6.  He should not be receiving daptomycin any longer as per the most recent hospital course.  Continue weekly CBC, BMP, ESR, CRP. Continue wound care and orthopedic follow up.  Can follow up with ID as needed.

## 2022-04-24 NOTE — Assessment & Plan Note (Signed)
Patient needs IR follow up after completing antibiotics for tunneled line removal.  Order for IR evaluation and management placed.

## 2022-04-24 NOTE — Assessment & Plan Note (Signed)
Patient noted to be saturating 86% on ambient air.  He feels well.  Denies any SOB or pulmonary symptoms.  States he wears O2 at night and occasionally during the day when he thinks he needs it.  Recommended to patient and caregiver with him from facility that he should be evaluated by their provider at facility for his hypoxia.  He is still smoking and was cautioned about this.

## 2022-04-24 NOTE — Progress Notes (Signed)
[facility]  CHIEF COMPLAINT:    Follow up for osteomyelitis and hardware infection  SUBJECTIVE:    Eric Scott is a 71 y.o. male with PMHx as below who presents to the clinic for osteomyelitis and hardware infection.   Patient has history of left leg wound and hardware infection with osteomyelitis s/p hardware removal and debridement with Dr Sharol Given on 03/28/22.  Cultures were polymicrobial with Acinetobacter, Morganella, VRE, and MRSA.  He was discharged with to SNF with tunneled central line (due to advanced CKD) on daptomycin and meropenem through 05/09/22.  Unfortunately, he was readmitted to the hospital with respiratory failure and pneumonia in November and just discharged 4 days ago.  His peripheral eosinophils were also elevated and daptomycin induced pneumonitis vs aspiration was not excluded.  Therefore, meropenem was continued and daptomycin replaced with linezolid to start 11/25 after a dose of oritavancin 11/14.  He saw Dr Sharol Given on 04/14/22 during that admit and noted to have healthy granulation tissue with a small amount of serous drainage but no surrounding cellulitis.   Please see A&P for the details of today's visit and status of the patient's medical problems.   Patient's Medications  New Prescriptions   No medications on file  Previous Medications   ACETAMINOPHEN (TYLENOL) 325 MG TABLET    Take 2 tablets (650 mg total) by mouth every 6 (six) hours as needed for mild pain, fever or headache (or Fever >/= 101).   ALBUTEROL (PROVENTIL) (2.5 MG/3ML) 0.083% NEBULIZER SOLUTION    Take 3 mLs (2.5 mg total) by nebulization 3 (three) times daily.   ALBUTEROL (PROVENTIL) (2.5 MG/3ML) 0.083% NEBULIZER SOLUTION    Take 3 mLs (2.5 mg total) by nebulization every 6 (six) hours as needed for wheezing or shortness of breath.   ALPRAZOLAM (XANAX) 0.5 MG TABLET    Take 1 tablet (0.5 mg total) by mouth at bedtime.   AMINO ACIDS-PROTEIN HYDROLYS (PRO-STAT  AWC) LIQD    Take 30 mLs by mouth daily.   AMIODARONE (PACERONE) 200 MG TABLET    Continue amiodarone 200 mg BID or 8 days (16 more doses), then transition to amiodarone 200 mg daily.   AMLODIPINE (NORVASC) 10 MG TABLET    Take 1 tablet (10 mg total) by mouth daily.   APIXABAN (ELIQUIS) 5 MG TABS TABLET    Take 1 tablet (5 mg total) by mouth 2 (two) times daily.   ASCORBIC ACID (VITAMIN C) 500 MG TABLET    Take 500 mg by mouth 2 (two) times daily.   ASPIRIN EC 81 MG TABLET    Take 81 mg by mouth daily. Swallow whole.   BREO ELLIPTA 100-25 MCG/INH AEPB    Inhale 1 puff into the lungs daily.    CALCITRIOL (ROCALTROL) 0.25 MCG CAPSULE    Take 0.25 mcg by mouth daily.   CALCIUM CARBONATE (TUMS - DOSED IN MG ELEMENTAL CALCIUM) 500 MG CHEWABLE TABLET    Chew 2 tablets by mouth 2 (two) times daily.   CHOLECALCIFEROL (VITAMIN D) 1000 UNITS TABLET    Take 1,000 Units by mouth daily.   COCONUT OIL OIL    Apply 1 Application topically every 6 (six) hours as needed (protection and prevention).   COLCHICINE 0.6 MG TABLET    Take 0.5 tablets (0.3 mg total) by mouth daily.   CYANOCOBALAMIN 1000 MCG TABLET    Take 1,000 mcg by mouth daily.   FERROUS SULFATE 325 (65 FE)  MG TABLET    Take 325 mg by mouth 2 (two) times daily.   FLUOXETINE (PROZAC) 40 MG CAPSULE    Take 40 mg by mouth daily.   GABAPENTIN (NEURONTIN) 300 MG CAPSULE    Take 1 capsule (300 mg total) by mouth at bedtime.   GUAIFENESIN 200 MG TABLET    Take 200 mg by mouth 2 (two) times daily.   HUMALOG KWIKPEN 100 UNIT/ML KWIKPEN    Inject into the skin.   HYDROCODONE-ACETAMINOPHEN (NORCO) 7.5-325 MG TABLET    Take 1 tablet by mouth 4 (four) times daily.   INSULIN ASPART (NOVOLOG) 100 UNIT/ML INJECTION    Inject 2-10 Units into the skin See admin instructions. Sliding scale if 200-250 = 2 units, 251-300 = 4 units, 301-350 = 6 units, 351-400 = 8 units, 401-500 units = 10 units before meals and at betime   IPRATROPIUM-ALBUTEROL (DUONEB) 0.5-2.5 (3) MG/3ML  SOLN    Take 3 mLs by nebulization every 4 (four) hours as needed (shortness of breath / wheezing).   LANTUS 100 UNIT/ML INJECTION    Inject 0.22 mLs (22 Units total) into the skin at bedtime.   LINEZOLID (ZYVOX) 600 MG TABLET    Take 1 tablet (600 mg total) by mouth every 12 (twelve) hours for 11 days.   MAGNESIUM OXIDE (MAG-OX) 400 MG TABLET    Take 400 mg by mouth daily.   MEROPENEM (MERREM) 1 G INJECTION    Inject 1 g into the vein every 12 (twelve) hours for 19 days. Indication:  Polymicrobial L-ankle osteo First Dose: Yes Last Day of Therapy:  05/09/22 Labs - Once weekly:  CBC/D and BMP, Labs - Every other week:  ESR and CRP Rate: 200 mL/hour Fluid: Normal saline 100 ml   METOPROLOL TARTRATE (LOPRESSOR) 25 MG TABLET    Take 1 tablet (25 mg total) by mouth 2 (two) times daily.   NUTRITIONAL SUPPLEMENT LIQD    Take 120 mLs by mouth daily. "House supplement"   PANTOPRAZOLE (PROTONIX) 40 MG TABLET    Take 1 tablet (40 mg total) by mouth daily before breakfast.   POLYETHYLENE GLYCOL (MIRALAX / GLYCOLAX) 17 G PACKET    Take 17 g by mouth daily. For constipation   SENNA (SENOKOT) 8.6 MG TABLET    Take 2 tablets by mouth 2 (two) times daily.   SENNA-DOCUSATE (SENOKOT-S) 8.6-50 MG TABLET    Take 1 tablet by mouth at bedtime as needed for mild constipation.   SODIUM ZIRCONIUM CYCLOSILICATE (LOKELMA) 10 G PACK PACKET    Take 10 g by mouth 3 (three) times daily.   TAMSULOSIN (FLOMAX) 0.4 MG CAPS CAPSULE    Take 1 capsule (0.4 mg total) by mouth daily after supper.   TIOTROPIUM (SPIRIVA HANDIHALER) 18 MCG INHALATION CAPSULE    Place 1 capsule (18 mcg total) into inhaler and inhale daily.   TORSEMIDE (DEMADEX) 20 MG TABLET    Take 4 tablets (80 mg total) by mouth daily.  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   COPD (chronic obstructive pulmonary disease) ([place])    COVID-19    Depression    Diabetic peripheral  neuropathy ([place])    "chronic" (11/94/1740)   Diastolic CHF ([name])    Duodenal ulcer hemorrhage    DVT (deep venous thrombosis) ([name])    right upper arm   GERD (gastroesophageal reflux disease)    Hypercholesteremia    Hypertension    Iron deficiency anemia    Osteomyelitis of ankle ([name])    Peripheral edema    PVD (peripheral vascular disease) ([place])    Spinal stenosis    mild lumbar (MRI 05/2012)-L2-L3 to L4-L5 , mild lumbar foraminal stenosis    Stroke ([place]) 05/2012   Subacute, lacunar infarcts within the left basal ganglia and posterior limp of the left internal capsule/thalamus; "RUE; both feet weak" (05/27/2012)   Tremor    Type II diabetes mellitus ([name])     Social History   Tobacco Use   Smoking status: Every Day    Packs/day: 0.10    Years: 54.00    Total pack years: 5.40    Types: Cigarettes    Last attempt to quit: 12/12/2019    Years since quitting: 2.3   Smokeless tobacco: Never   Tobacco comments:    A pack a week  Vaping Use   Vaping Use: Never used  Substance Use Topics   Alcohol use: No    Comment: 05/27/2012 "drank gallons and gallons 20 yr ago or so; last drink  at least 10 yr ago"   Drug use: No    Family History  Problem Relation Age of Onset   Colon cancer Neg Hx     Allergies  Allergen Reactions   Baclofen     unknown   Blueberry Flavor Other (See Comments)    Unknown   Cucumber Extract Other (See Comments)    "Fells like I'm having a heart attack"   Flexeril [Cyclobenzaprine Hcl] Other (See Comments)    "whole body  Tremors" (05/27/2012)   Kiwi Extract Other (See Comments)    "feels like I'm having a heart attack"    Review of Systems  All other systems reviewed and are negative.  Except as noted above.  OBJECTIVE:    Vitals:    04/24/22 1047 04/24/22 1054  BP:  113/64  Pulse:  (!) 111  Temp:  98.4 F (36.9 C)  TempSrc:  Oral  SpO2:  (!) 86%  Weight: 270 lb 12.8 oz (122.8 kg)    Body mass index is 36.73 kg/m.  Physical Exam Constitutional:      General: He is not in acute distress. HENT:     Head: Normocephalic and atraumatic.  Eyes:     Extraocular Movements: Extraocular movements intact.     Conjunctiva/sclera: Conjunctivae normal.  Pulmonary:     Effort: Pulmonary effort is normal. No respiratory distress.     Comments: He is on room air.  Musculoskeletal:     Cervical back: Normal range of motion and neck supple.     Comments: Tunneled central line in place.  Wound over left lower leg appears to be doing okay.  He has venous stasis and dermatitis from this  Skin:    General: Skin is warm and dry.  Neurological:     General: No focal deficit present.     Mental Status: He is alert and oriented to person, place, and time.      Labs and Microbiology:    Latest Ref Rng & Units 04/17/2022    4:50 AM 04/16/2022    3:39 AM 04/15/2022    4:17 AM  CBC  WBC 4.0 - 10.5 K/uL 12.8  11.9  12.6   Hemoglobin 13.0 - 17.0 g/dL 8.5  8.5  8.7   Hematocrit 39.0 - 52.0 % 27.1  25.8  26.9   Platelets 150 - 400 K/uL 223  243  259       Latest Ref Rng & Units 04/20/2022    3:50 AM 04/19/2022    2:58 AM 04/18/2022    2:33 AM  CMP  Glucose 70 - 99 mg/dL 93  86  156   BUN 8 - 23 mg/dL 98  98  101   Creatinine 0.61 - 1.24 mg/dL 3.34  3.36  3.36   Sodium 135 - 145 mmol/L 141  142  138   Potassium 3.5 - 5.1 mmol/L 5.1  5.4  5.3   Chloride 98 - 111 mmol/L 101  98  98   CO2 22 - 32 mmol/L 31  32  30   Calcium 8.9 - 10.3 mg/dL 8.1  8.4  8.1       ASSESSMENT & PLAN:    Hardware complicating wound infection ([place]) He will continue antibiotics through 05/09/22 to complete a 6 week course for OM following hardware removal on 03/28/22 with Dr Sharol Given.  He will continue meropenem 1gm Q12H as ordered per renal  dosing.  He received a dose of oritavancin on 11/14 and should start taking linezolid 652m PO BID on 11/25 through 12/6.  He should not be receiving daptomycin any longer as per the most recent hospital course.  Continue weekly CBC, BMP, ESR, CRP. Continue wound care and orthopedic follow up.  Can follow up with ID as needed.   Status post peripherally inserted central catheter (PICC) central line placement Patient needs IR follow up after completing antibiotics for tunneled line removal.  Order for IR evaluation and management placed.   Hypoxia Patient noted to be saturating 86% on ambient air.  He feels well.  Denies any SOB or pulmonary symptoms.  States he wears O2 at night and occasionally during the day when he thinks he needs it.  Recommended to patient and caregiver with him from facility that he should be evaluated by their provider at facility for his hypoxia.  He is still smoking and was cautioned about this.    Orders Placed This Encounter  Procedures   IR Radiologist Eval & Mgmt    Standing Status:   Future    Standing Expiration Date:   04/25/2023    Order Specific Question:   Reason for Exam (SYMPTOM  OR DIAGNOSIS REQUIRED)    Answer:   patient with tunneled central line.  needs removal following abx course on 05/09/22    Order Specific Question:   Informed consent is required to be obtained if the patient has a GFR less than 374mmin (consider nephrology consult)    Answer:   Informed consent obtained    Order Specific Question:   Preferred Imaging Location?    Answer:   [facility] 04/24/2022, 11:11 AM   I have personally spent 40 minutes involved in face-to-face and non-face-to-face activities for this patient on the day of the visit. Professional time spent includes the following activities: Preparing to see the patient (review of tests), Obtaining and/or  reviewing separately obtained history (admission/discharge record), Performing a medically appropriate examination and/or evaluation , Ordering medications/tests/procedures, referring and communicating with  other health care professionals, Documenting clinical information in the EMR, Independently interpreting results (not separately reported), Communicating results to the patient/family/caregiver, Counseling and educating the patient/family/caregiver and Care coordination (not separately reported).

## 2022-04-24 NOTE — Telephone Encounter (Signed)
Called and spoke with Eric Scott in scheduling at [facility] in [place] [phone]). Per verbal order from Dr. Juleen China, patient scheduled for tunneled CVC removal at [facility] on 05/10/22 at [place] verbalized understanding of date, time, and location. All questions answered.  Spoke with patient's nurse [place], [place]. Relayed information from Dr. Juleen China that was printed on the patient's AVS, including continuing meropenem IV Q12H as ordered, and starting linezolid 600 mg BID on 11/25. IV and PO antibiotics will be complete on 05/09/22. Relayed to continue weekly labs per order.  Made RN aware of patient's appointment with IR for CVC removal on 12/7. Discussed note from Dr. Juleen China regarding patient's oxygen saturation in the office today, and RN stated she would make the patient's provider aware at his facility.  All questions answered and RN is aware that all information can be found on the AVS sent with the patient.  Binnie Kand, RN

## 2022-04-24 NOTE — Patient Instructions (Addendum)
Thank you for coming to see me today. It was a pleasure seeing you.  Summary of Recommendations:   Hardware complicating wound infection ([place]) He will continue antibiotics through 05/09/22 to complete a 6 week course for OM following hardware removal on 03/28/22 with Dr Sharol Given.  He will continue meropenem 1gm Q12H as ordered per renal dosing.  He received a dose of oritavancin on 11/14 and should start taking linezolid 625m PO BID on 11/25 through 12/6.  He should not be receiving daptomycin any longer as per the most recent hospital course.  Continue weekly CBC, BMP, ESR, CRP. Continue wound care and orthopedic follow up.  Can follow up with ID as needed.   Status post peripherally inserted central catheter (PICC) central line placement Patient needs IR follow up after completing antibiotics for tunneled line removal.  Order for IR evaluation and management placed.   Hypoxia Patient noted to be saturating 86% on ambient air.  He feels well.  Denies any SOB or pulmonary symptoms.  States he wears O2 at night and occasionally during the day when he thinks he needs it.  Recommended to patient and caregiver with him from facility that he should be evaluated by their provider at facility for his hypoxia.  He is still smoking and was cautioned about this.     If you have any questions or concerns, please do not hesitate to call the office at ([phone]  Take Care,   AJule Ser

## 2022-04-30 ENCOUNTER — Encounter: Payer: Self-pay | Admitting: Pharmacist

## 2022-05-02 ENCOUNTER — Telehealth: Payer: Self-pay | Admitting: Pharmacist

## 2022-05-02 NOTE — Telephone Encounter (Signed)
[place], nurse from [facility], called to report patient experiencing 2-day history of "beet-red" full-body rash while on linezolid and meropenem. Patient started on these around the end of October. Planned end date is 05/09/22. No further commentary provided on continuing oral antibiotics after this time. Secure chatted Dr. Juleen China to request recommendations. SNF currently holding antibiotics. Will follow-up on plan as available and return call to [facility] at [phone].  Alfonse Spruce, PharmD, CPP, BCIDP, [place] Clinical Pharmacist Practitioner Infectious [facility]

## 2022-05-02 NOTE — Telephone Encounter (Signed)
Reviewed reaction with Dr. Juleen China. Patient recently started linezolid this week after completing oritvancin infusion on 11/14, so it is likely the culprit for rash. Dr. Juleen China advised to stop antibiotics today (one week earlier than initially scheduled stop date). Orders relayed to [facility] at [facility]. CVC removal planned for 12/7 with [facility].  Alfonse Spruce, PharmD, CPP, BCIDP, [place] Clinical Pharmacist Practitioner Infectious [place] [facility]

## 2022-05-08 ENCOUNTER — Ambulatory Visit (INDEPENDENT_AMBULATORY_CARE_PROVIDER_SITE_OTHER): Payer: Medicare (Managed Care) | Admitting: Orthopedic Surgery

## 2022-05-08 DIAGNOSIS — T847XXA Infection and inflammatory reaction due to other internal orthopedic prosthetic devices, implants and grafts, initial encounter: Secondary | ICD-10-CM

## 2022-05-10 ENCOUNTER — Ambulatory Visit (HOSPITAL_COMMUNITY)
Admission: RE | Admit: 2022-05-10 | Discharge: 2022-05-10 | Disposition: A | Discharge status: Home / Self Care | Payer: Medicare (Managed Care) | Source: Ambulatory Visit | Attending: Internal Medicine | Admitting: Internal Medicine

## 2022-05-10 DIAGNOSIS — Z95828 Presence of other vascular implants and grafts: Secondary | ICD-10-CM

## 2022-05-10 DIAGNOSIS — Z452 Encounter for adjustment and management of vascular access device: Secondary | ICD-10-CM | POA: Insufficient documentation

## 2022-05-10 HISTORY — PX: IR REMOVAL TUN CV CATH W/O FL: IMG2289

## 2022-05-10 MED ORDER — LIDOCAINE HCL 1 % IJ SOLN: 20.0000 mL | Freq: Once | INTRAMUSCULAR | Status: AC

## 2022-05-10 MED ORDER — LIDOCAINE HCL 1 % IJ SOLN
INTRAMUSCULAR | Status: AC
  Administered 2022-05-10: 2 mL via INTRADERMAL
  Filled 2022-05-10: qty 20

## 2022-05-15 ENCOUNTER — Encounter: Payer: Self-pay | Admitting: Orthopedic Surgery

## 2022-05-15 NOTE — Progress Notes (Signed)
Office Visit Note   Patient: Eric Scott           Date of Birth: [birth year]           MRN: [id number] Visit Date: 05/08/2022              Requested by: [name], MD [address] [place],  [place] [address] PCP: [name], MD  Chief Complaint  Patient presents with   Left Ankle - Routine Post Op    03/28/2022 left ankle removal HDW kerecis graft       HPI: Patient is a 71 year old gentleman who presents for his first postoperative appointment after removal of deep hardware removal of bone and application of Kerecis tissue graft for left ankle hardware infection.  Patient is following up with infectious disease and saw them on November 21.  Plan for continue antibiotics through December 6.  Patient states he stopped his antibiotics secondary to breaking out in a rash.  Assessment & Plan: Visit Diagnoses:  1. Hardware complicating wound infection, initial encounter [facility])     Plan: We will harvest the sutures.  Dial soap cleansing and dry dressing change daily.  Continue with foam boots to protect the foot and wound.  Follow-Up Instructions: Return in about 1 week (around 05/15/2022).   Ortho Exam  Patient is alert, oriented, no adenopathy, well-dressed, normal affect, normal respiratory effort. Examination of the incision is healing nicely there is minimal swelling the wound edges are well-approximated except for 1 area of wound breakdown 3 x 1 cm distal aspect of the wound.  There is no cellulitis no odor no drainage.  Patient is undergoing Iodosorb dressing change daily.  Imaging: No results found.   Labs: Lab Results  Component Value Date   HGBA1C 5.8 (H) 03/28/2022   HGBA1C 6.5 (H) 05/12/2020   HGBA1C 7.0 (H) 11/16/2019   ESRSEDRATE 49 (H) 03/31/2022   ESRSEDRATE 60 (H) 11/26/2016   CRP 10.8 (H) 03/31/2022   CRP 6.5 (H) 04/20/2019   CRP 3.2 (H) 11/26/2016   LABURIC 11.5 (H) 08/24/2009   REPTSTATUS 04/17/2022 FINAL 04/12/2022   GRAMSTAIN NO WBC SEEN NO  ORGANISMS SEEN  03/28/2022   CULT  04/12/2022    NO GROWTH 5 DAYS Performed at [facility], [address] [place], [place] [address]    [place] CALCOACETICUS/BAUMANNII COMPLEX 03/28/2022   LABORGA MORGANELLA MORGANII 03/28/2022   LABORGA ENTEROCOCCUS FAECALIS 03/28/2022   LABORGA METHICILLIN RESISTANT STAPHYLOCOCCUS AUREUS 03/28/2022     Lab Results  Component Value Date   ALBUMIN 2.9 (L) 04/12/2022   ALBUMIN 3.5 01/31/2021   ALBUMIN 3.6 12/20/2020    Lab Results  Component Value Date   MG 2.0 04/12/2022   MG 1.9 04/09/2022   MG 1.8 05/14/2020   No results found for: "VD25OH"  No results found for: "PREALBUMIN"    Latest Ref Rng & Units 04/17/2022    4:50 AM 04/16/2022    3:39 AM 04/15/2022    4:17 AM  CBC EXTENDED  WBC 4.0 - 10.5 K/uL 12.8  11.9  12.6   RBC 4.22 - 5.81 MIL/uL 2.83  2.75  2.92   Hemoglobin 13.0 - 17.0 g/dL 8.5  8.5  8.7   HCT 39.0 - 52.0 % 27.1  25.8  26.9   Platelets 150 - 400 K/uL 223  243  259      There is no height or weight on file to calculate BMI.  Orders:  No orders of the defined  types were placed in this encounter.  No orders of the defined types were placed in this encounter.    Procedures: No procedures performed  Clinical Data: No additional findings.  ROS:  All other systems negative, except as noted in the HPI. Review of Systems  Objective: Vital Signs: There were no vitals taken for this visit.  Specialty Comments:  No specialty comments available.  PMFS History: Patient Active Problem List   Diagnosis Date Noted   Status post peripherally inserted central catheter (PICC) central line placement 04/24/2022   PAF (paroxysmal atrial fibrillation) ([place]) 04/06/2022   Mood disorder ([place]) 04/02/2022   Stage 4 chronic kidney disease ([place])    Osteomyelitis of ankle or foot, acute, left ([place]) 03/29/2022   Ulcer of left lower leg, with necrosis of bone ([place]) 03/28/2022   Abscess of left lower leg  29/79/8921   Hardware complicating wound infection ([place]) 03/28/2022   Leg wound, left, sequela 03/28/2022   Cholelithiasis 05/12/2020   Elevated liver enzymes    RUQ pain    Bacteremia 05/11/2020   COPD  GOLD ?  / AB  01/26/2020   Acute on chronic respiratory failure with hypoxia ([name]) 12/18/2019   Respiratory failure with hypoxia ([place]) 11/17/2019   Normocytic anemia 11/16/2019   Cellulitis of both lower extremities 11/16/2019   Leukocytosis 05/22/2019   Chronic heart failure with preserved ejection fraction (HFpEF) ([place]) 05/22/2019   Acute respiratory failure ([place]) 05/17/2019   Obesity, Class III, BMI 40-49.9 (morbid obesity) ([place]) 05/17/2019   Atrial flutter, paroxysmal ([place]) 05/17/2019   COPD with acute exacerbation ([place]) 04/20/2019   GERD (gastroesophageal reflux disease) 08/08/2018   PUD (peptic ulcer disease)    Gastric erosions 02/13/2017   Duodenal ulcer 02/13/2017   Transaminitis    UGI bleed    Arm DVT (deep venous thromboembolism), acute, right ([place]) 12/18/2016   Uremia 12/10/2016   Acute renal failure ([name])    Volume depletion 11/23/2016   Hypotension    Liver enzyme elevation    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 ([place]) 11/15/2016   Arterial insufficiency of lower extremity ([place]) 11/15/2016   Pressure injury of skin 11/09/2016   Sepsis ([place]) 11/09/2016   Osteomyelitis ([place]) 11/09/2016   Wounds, multiple 11/08/2016   Chronic venous insufficiency 10/12/2016   Critical lower limb ischemia ([place]) 10/12/2016   Fatty liver 09/10/2016   History of colonic polyps 09/10/2016   Gallstone 09/10/2016   AKI (acute kidney injury) ([place]) 03/04/2015   Fall 03/04/2015   Generalized weakness 03/04/2015   Diabetic polyneuropathy associated with type 2 diabetes mellitus ([place])    Anxiety    PVD (peripheral vascular disease) ([place])    Pneumonia 07/20/2014   Anxiety state 04/18/2014   Acute on chronic diastolic CHF  (congestive heart failure) ([place]) 04/07/2014   Solitary pulmonary nodule 04/07/2014   COPD with exacerbation ([place]) 04/02/2014   Hypoxia 04/02/2014   Cerebral infarction ([place]) 06/01/2012   Chronic pain (back, legs) 05/30/2012   Toe laceration, 4th toe 05/30/2012   C. difficile diarrhea 05/30/2012   Hypokalemia 05/30/2012   Acute lacunar stroke ([place]) 05/27/2012   Type 2 diabetes mellitus with stage 4 chronic kidney disease, with Xia-term current use of insulin ([place]) 05/27/2012   Smoker 05/27/2012   HTN (hypertension) 05/27/2012   Past Medical History:  Diagnosis Date   Anemia    Anxiety    Chronic pain    legs, back; MRI 05/2012 with mild thoracic degenerative changes no  spinal stenosis    CKD (chronic kidney disease) 05/12/2019   Stage IV   COPD (chronic obstructive pulmonary disease) ([place])    COVID-19    Depression    Diabetic peripheral neuropathy ([name])    "chronic" (97/67/3419)   Diastolic CHF ([name])    Duodenal ulcer hemorrhage    DVT (deep venous thrombosis) ([name])    right upper arm   GERD (gastroesophageal reflux disease)    Hypercholesteremia    Hypertension    Iron deficiency anemia    Osteomyelitis of ankle ([name])    Peripheral edema    PVD (peripheral vascular disease) ([place])    Spinal stenosis    mild lumbar (MRI 05/2012)-L2-L3 to L4-L5 , mild lumbar foraminal stenosis    Stroke ([place]) 05/2012   Subacute, lacunar infarcts within the left basal ganglia and posterior limp of the left internal capsule/thalamus; "RUE; both feet weak" (05/27/2012)   Tremor    Type II diabetes mellitus ([place])     Family History  Problem Relation Age of Onset   Colon cancer Neg Hx     Past Surgical History:  Procedure Laterality Date   ANKLE SURGERY     APPLICATION OF WOUND VAC Left 03/28/2022   Procedure: APPLICATION OF WOUND VAC;  Surgeon: [name], MD;  Location: [facility];  Service: Orthopedics;  Laterality: Left;   ESOPHAGOGASTRODUODENOSCOPY (EGD) WITH PROPOFOL N/A 12/21/2016    Rourk: Ulcerative reflux esophagitis, erosive gastropathy, extensive duodenal ulceration likely site of bleeding.  Pathology with mild gastritis, no H. pylori   ESOPHAGOGASTRODUODENOSCOPY (EGD) WITH PROPOFOL N/A 04/02/2017   Fields: ESOPAHGITIS/GASTRIC AND DUODENAL ULCERS HEALED. mild duodenitis   HARDWARE REMOVAL Left 03/28/2022   Procedure: LEFT ANKLE REMOVAL DEEP HARDWARE, REMOVE BONE AND APPLY TISSUE GRAFT;  Surgeon: [name], MD;  Location: [facility];  Service: Orthopedics;  Laterality: Left;   HERNIA REPAIR  01/05/2004   "belly button" (05/27/2012)   IR FLUORO GUIDE CV LINE RIGHT  04/03/2022   IR REMOVAL TUN CV CATH W/O FL  05/10/2022   IR US GUIDE VASC ACCESS RIGHT  04/03/2022   Social History   Occupational History   Not on file  Tobacco Use   Smoking status: Every Day    Packs/day: 0.10    Years: 54.00    Total pack years: 5.40    Types: Cigarettes    Last attempt to quit: 12/12/2019    Years since quitting: 2.4   Smokeless tobacco: Never   Tobacco comments:    A pack a week  Vaping Use   Vaping Use: Never used  Substance and Sexual Activity   Alcohol use: No    Comment: 05/27/2012 "drank gallons and gallons 20 yr ago or so; last drink  at least 10 yr ago"   Drug use: No   Sexual activity: Not Currently

## 2022-05-18 ENCOUNTER — Encounter: Payer: Self-pay | Admitting: Cardiovascular Disease

## 2022-06-05 ENCOUNTER — Ambulatory Visit (INDEPENDENT_AMBULATORY_CARE_PROVIDER_SITE_OTHER): Payer: Medicare (Managed Care) | Admitting: Orthopedic Surgery

## 2022-06-05 DIAGNOSIS — L97311 Non-pressure chronic ulcer of right ankle limited to breakdown of skin: Secondary | ICD-10-CM

## 2022-06-05 DIAGNOSIS — T847XXA Infection and inflammatory reaction due to other internal orthopedic prosthetic devices, implants and grafts, initial encounter: Secondary | ICD-10-CM

## 2022-06-05 DIAGNOSIS — I872 Venous insufficiency (chronic) (peripheral): Secondary | ICD-10-CM

## 2022-06-06 ENCOUNTER — Encounter: Payer: Self-pay | Admitting: Orthopedic Surgery

## 2022-06-06 NOTE — Progress Notes (Signed)
Office Visit Note   Patient: Eric Scott           Date of Birth: 04-25-1951           MRN: [id number] Visit Date: 06/05/2022              Requested by: [name], MD [address] [place],  [place] [address] PCP: [name], MD  Chief Complaint  Patient presents with   Left Ankle - Routine Post Op    03/28/2022 left ankle removal HWD with kerecis       HPI: Patient is a 72 year old gentleman who is 2 months status post left ankle removal of hardware tissue reinforcement with Kerecis.  Patient is currently at skilled nursing he is on Keflex 500 mg 3 times a day through January 9.  Assessment & Plan: Visit Diagnoses:  1. Hardware complicating wound infection, initial encounter ([place])   2. Non-pressure chronic ulcer of right ankle limited to breakdown of skin ([name])   3. Venous insufficiency (chronic) (peripheral)     Plan: Orders were written for compression wrap to be changed 2 times a week and elevation.  Follow-Up Instructions: Return in about 4 weeks (around 07/03/2022).   Ortho Exam  Patient is alert, oriented, no adenopathy, well-dressed, normal affect, normal respiratory effort. Examination patient has increased venous swelling in the left leg from dependence.  He is on nasal cannula FiO2.  There is weeping pitting edema of the entire leg.  The wound bed is 5 x 2 cm and 2 mm deep with healthy granulation tissue.  Imaging: No results found. No images are attached to the encounter.  Labs: Lab Results  Component Value Date   HGBA1C 5.8 (H) 03/28/2022   HGBA1C 6.5 (H) 05/12/2020   HGBA1C 7.0 (H) 11/16/2019   ESRSEDRATE 49 (H) 03/31/2022   ESRSEDRATE 60 (H) 11/26/2016   CRP 10.8 (H) 03/31/2022   CRP 6.5 (H) 04/20/2019   CRP 3.2 (H) 11/26/2016   LABURIC 11.5 (H) 08/24/2009   REPTSTATUS 04/17/2022 FINAL 04/12/2022   GRAMSTAIN NO WBC SEEN NO ORGANISMS SEEN  03/28/2022   CULT  04/12/2022    NO GROWTH 5 DAYS Performed at [facility], [address]  [place], [place] [address]    [place] CALCOACETICUS/BAUMANNII COMPLEX 03/28/2022   LABORGA MORGANELLA MORGANII 03/28/2022   LABORGA ENTEROCOCCUS FAECALIS 03/28/2022   LABORGA METHICILLIN RESISTANT STAPHYLOCOCCUS AUREUS 03/28/2022     Lab Results  Component Value Date   ALBUMIN 2.9 (L) 04/12/2022   ALBUMIN 3.5 01/31/2021   ALBUMIN 3.6 12/20/2020    Lab Results  Component Value Date   MG 2.0 04/12/2022   MG 1.9 04/09/2022   MG 1.8 05/14/2020   No results found for: "VD25OH"  No results found for: "PREALBUMIN"    Latest Ref Rng & Units 04/17/2022    4:50 AM 04/16/2022    3:39 AM 04/15/2022    4:17 AM  CBC EXTENDED  WBC 4.0 - 10.5 K/uL 12.8  11.9  12.6   RBC 4.22 - 5.81 MIL/uL 2.83  2.75  2.92   Hemoglobin 13.0 - 17.0 g/dL 8.5  8.5  8.7   HCT 39.0 - 52.0 % 27.1  25.8  26.9   Platelets 150 - 400 K/uL 223  243  259      There is no height or weight on file to calculate BMI.  Orders:  No orders of the defined types were placed in this encounter.  No orders of the defined  types were placed in this encounter.    Procedures: No procedures performed  Clinical Data: No additional findings.  ROS:  All other systems negative, except as noted in the HPI. Review of Systems  Objective: Vital Signs: There were no vitals taken for this visit.  Specialty Comments:  No specialty comments available.  PMFS History: Patient Active Problem List   Diagnosis Date Noted   Status post peripherally inserted central catheter (PICC) central line placement 04/24/2022   PAF (paroxysmal atrial fibrillation) ([place]) 04/06/2022   Mood disorder ([place]) 04/02/2022   Stage 4 chronic kidney disease ([place])    Osteomyelitis of ankle or foot, acute, left ([place]) 03/29/2022   Ulcer of left lower leg, with necrosis of bone ([place]) 03/28/2022   Abscess of left lower leg 69/48/5462   Hardware complicating wound infection ([place]) 03/28/2022   Leg wound, left, sequela 03/28/2022    Cholelithiasis 05/12/2020   Elevated liver enzymes    RUQ pain    Bacteremia 05/11/2020   COPD  GOLD ?  / AB  01/26/2020   Acute on chronic respiratory failure with hypoxia ([name]) 12/18/2019   Respiratory failure with hypoxia ([place]) 11/17/2019   Normocytic anemia 11/16/2019   Cellulitis of both lower extremities 11/16/2019   Leukocytosis 05/22/2019   Chronic heart failure with preserved ejection fraction (HFpEF) ([place]) 05/22/2019   Acute respiratory failure ([place]) 05/17/2019   Obesity, Class III, BMI 40-49.9 (morbid obesity) ([place]) 05/17/2019   Atrial flutter, paroxysmal ([place]) 05/17/2019   COPD with acute exacerbation ([place]) 04/20/2019   GERD (gastroesophageal reflux disease) 08/08/2018   PUD (peptic ulcer disease)    Gastric erosions 02/13/2017   Duodenal ulcer 02/13/2017   Transaminitis    UGI bleed    Arm DVT (deep venous thromboembolism), acute, right ([place]) 12/18/2016   Uremia 12/10/2016   Acute renal failure ([name])    Volume depletion 11/23/2016   Hypotension    Liver enzyme elevation    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 ([place]) 11/15/2016   Arterial insufficiency of lower extremity ([place]) 11/15/2016   Pressure injury of skin 11/09/2016   Sepsis ([place]) 11/09/2016   Osteomyelitis ([place]) 11/09/2016   Wounds, multiple 11/08/2016   Chronic venous insufficiency 10/12/2016   Critical lower limb ischemia ([place]) 10/12/2016   Fatty liver 09/10/2016   History of colonic polyps 09/10/2016   Gallstone 09/10/2016   AKI (acute kidney injury) ([place]) 03/04/2015   Fall 03/04/2015   Generalized weakness 03/04/2015   Diabetic polyneuropathy associated with type 2 diabetes mellitus ([place])    Anxiety    PVD (peripheral vascular disease) ([place])    Pneumonia 07/20/2014   Anxiety state 04/18/2014   Acute on chronic diastolic CHF (congestive heart failure) ([place]) 04/07/2014   Solitary pulmonary nodule 04/07/2014   COPD with exacerbation ([place])  04/02/2014   Hypoxia 04/02/2014   Cerebral infarction ([place]) 06/01/2012   Chronic pain (back, legs) 05/30/2012   Toe laceration, 4th toe 05/30/2012   C. difficile diarrhea 05/30/2012   Hypokalemia 05/30/2012   Acute lacunar stroke ([place]) 05/27/2012   Type 2 diabetes mellitus with stage 4 chronic kidney disease, with [place]one-term current use of insulin ([place]) 05/27/2012   Smoker 05/27/2012   HTN (hypertension) 05/27/2012   Past Medical History:  Diagnosis Date   Anemia    Anxiety    Chronic pain    legs, back; MRI 05/2012 with mild thoracic degenerative changes no spinal stenosis    CKD (chronic kidney disease) 05/12/2019  Stage IV   COPD (chronic obstructive pulmonary disease) ([place])    COVID-19    Depression    Diabetic peripheral neuropathy ([name])    "chronic" (29/47/6546)   Diastolic CHF ([name])    Duodenal ulcer hemorrhage    DVT (deep venous thrombosis) ([name])    right upper arm   GERD (gastroesophageal reflux disease)    Hypercholesteremia    Hypertension    Iron deficiency anemia    Osteomyelitis of ankle ([name])    Peripheral edema    PVD (peripheral vascular disease) ([place])    Spinal stenosis    mild lumbar (MRI 05/2012)-L2-L3 to L4-L5 , mild lumbar foraminal stenosis    Stroke ([place]) 05/2012   Subacute, lacunar infarcts within the left basal ganglia and posterior limp of the left internal capsule/thalamus; "RUE; both feet weak" (05/27/2012)   Tremor    Type II diabetes mellitus ([place])     Family History  Problem Relation Age of Onset   Colon cancer Neg Hx     Past Surgical History:  Procedure Laterality Date   ANKLE SURGERY     APPLICATION OF WOUND VAC Left 03/28/2022   Procedure: APPLICATION OF WOUND VAC;  Surgeon: [name], MD;  Location: [facility];  Service: Orthopedics;  Laterality: Left;   ESOPHAGOGASTRODUODENOSCOPY (EGD) WITH PROPOFOL N/A 12/21/2016   Rourk: Ulcerative reflux esophagitis, erosive gastropathy, extensive duodenal ulceration likely site of  bleeding.  Pathology with mild gastritis, no H. pylori   ESOPHAGOGASTRODUODENOSCOPY (EGD) WITH PROPOFOL N/A 04/02/2017   Fields: ESOPAHGITIS/GASTRIC AND DUODENAL ULCERS HEALED. mild duodenitis   HARDWARE REMOVAL Left 03/28/2022   Procedure: LEFT ANKLE REMOVAL DEEP HARDWARE, REMOVE BONE AND APPLY TISSUE GRAFT;  Surgeon: [name], MD;  Location: [facility];  Service: Orthopedics;  Laterality: Left;   HERNIA REPAIR  01/05/2004   "belly button" (05/27/2012)   IR FLUORO GUIDE CV LINE RIGHT  04/03/2022   IR REMOVAL TUN CV CATH W/O FL  05/10/2022   IR US GUIDE VASC ACCESS RIGHT  04/03/2022   Social History   Occupational History   Not on file  Tobacco Use   Smoking status: Every Day    Packs/day: 0.10    Years: 54.00    Total pack years: 5.40    Types: Cigarettes    Last attempt to quit: 12/12/2019    Years since quitting: 2.4   Smokeless tobacco: Never   Tobacco comments:    A pack a week  Vaping Use   Vaping Use: Never used  Substance and Sexual Activity   Alcohol use: No    Comment: 05/27/2012 "drank gallons and gallons 20 yr ago or so; last drink  at least 10 yr ago"   Drug use: No   Sexual activity: Not Currently

## 2022-06-18 ENCOUNTER — Encounter (HOSPITAL_COMMUNITY): Payer: Self-pay | Admitting: Emergency Medicine

## 2022-06-18 ENCOUNTER — Inpatient Hospital Stay (HOSPITAL_COMMUNITY)
Admission: EM | Admit: 2022-06-18 | Discharge: 2022-07-02 | DRG: 682 | Disposition: A | Discharge status: Skilled Nursing Facility | Payer: Medicare (Managed Care) | Source: Skilled Nursing Facility | Attending: Family Medicine | Admitting: Family Medicine

## 2022-06-18 ENCOUNTER — Emergency Department (HOSPITAL_COMMUNITY): Payer: Medicare (Managed Care)

## 2022-06-18 DIAGNOSIS — G8929 Other chronic pain: Secondary | ICD-10-CM | POA: Diagnosis present

## 2022-06-18 DIAGNOSIS — J449 Chronic obstructive pulmonary disease, unspecified: Secondary | ICD-10-CM | POA: Diagnosis present

## 2022-06-18 DIAGNOSIS — G928 Other toxic encephalopathy: Secondary | ICD-10-CM | POA: Diagnosis present

## 2022-06-18 DIAGNOSIS — Z79899 Other long term (current) drug therapy: Secondary | ICD-10-CM

## 2022-06-18 DIAGNOSIS — M79605 Pain in left leg: Secondary | ICD-10-CM | POA: Diagnosis present

## 2022-06-18 DIAGNOSIS — M898X9 Other specified disorders of bone, unspecified site: Secondary | ICD-10-CM | POA: Diagnosis present

## 2022-06-18 DIAGNOSIS — K2990 Gastroduodenitis, unspecified, without bleeding: Secondary | ICD-10-CM | POA: Diagnosis not present

## 2022-06-18 DIAGNOSIS — E7800 Pure hypercholesterolemia, unspecified: Secondary | ICD-10-CM | POA: Diagnosis present

## 2022-06-18 DIAGNOSIS — I10 Essential (primary) hypertension: Secondary | ICD-10-CM | POA: Diagnosis not present

## 2022-06-18 DIAGNOSIS — N184 Chronic kidney disease, stage 4 (severe): Secondary | ICD-10-CM | POA: Diagnosis present

## 2022-06-18 DIAGNOSIS — K2980 Duodenitis without bleeding: Secondary | ICD-10-CM | POA: Diagnosis not present

## 2022-06-18 DIAGNOSIS — K2950 Unspecified chronic gastritis without bleeding: Secondary | ICD-10-CM | POA: Diagnosis present

## 2022-06-18 DIAGNOSIS — F419 Anxiety disorder, unspecified: Secondary | ICD-10-CM | POA: Diagnosis present

## 2022-06-18 DIAGNOSIS — F32A Depression, unspecified: Secondary | ICD-10-CM | POA: Diagnosis present

## 2022-06-18 DIAGNOSIS — I130 Hypertensive heart and chronic kidney disease with heart failure and stage 1 through stage 4 chronic kidney disease, or unspecified chronic kidney disease: Secondary | ICD-10-CM | POA: Diagnosis present

## 2022-06-18 DIAGNOSIS — I872 Venous insufficiency (chronic) (peripheral): Secondary | ICD-10-CM | POA: Diagnosis present

## 2022-06-18 DIAGNOSIS — F418 Other specified anxiety disorders: Secondary | ICD-10-CM | POA: Diagnosis not present

## 2022-06-18 DIAGNOSIS — J9621 Acute and chronic respiratory failure with hypoxia: Secondary | ICD-10-CM | POA: Diagnosis not present

## 2022-06-18 DIAGNOSIS — I4892 Unspecified atrial flutter: Secondary | ICD-10-CM | POA: Diagnosis present

## 2022-06-18 DIAGNOSIS — T402X5A Adverse effect of other opioids, initial encounter: Secondary | ICD-10-CM | POA: Diagnosis present

## 2022-06-18 DIAGNOSIS — Z7951 Long term (current) use of inhaled steroids: Secondary | ICD-10-CM

## 2022-06-18 DIAGNOSIS — S91002A Unspecified open wound, left ankle, initial encounter: Secondary | ICD-10-CM | POA: Diagnosis present

## 2022-06-18 DIAGNOSIS — D649 Anemia, unspecified: Secondary | ICD-10-CM | POA: Diagnosis not present

## 2022-06-18 DIAGNOSIS — D631 Anemia in chronic kidney disease: Secondary | ICD-10-CM | POA: Diagnosis present

## 2022-06-18 DIAGNOSIS — Z7901 Long term (current) use of anticoagulants: Secondary | ICD-10-CM

## 2022-06-18 DIAGNOSIS — Z6838 Body mass index (BMI) 38.0-38.9, adult: Secondary | ICD-10-CM | POA: Diagnosis not present

## 2022-06-18 DIAGNOSIS — G9341 Metabolic encephalopathy: Secondary | ICD-10-CM | POA: Diagnosis present

## 2022-06-18 DIAGNOSIS — E876 Hypokalemia: Secondary | ICD-10-CM | POA: Diagnosis not present

## 2022-06-18 DIAGNOSIS — E11649 Type 2 diabetes mellitus with hypoglycemia without coma: Secondary | ICD-10-CM | POA: Diagnosis present

## 2022-06-18 DIAGNOSIS — E1142 Type 2 diabetes mellitus with diabetic polyneuropathy: Secondary | ICD-10-CM | POA: Diagnosis present

## 2022-06-18 DIAGNOSIS — D62 Acute posthemorrhagic anemia: Secondary | ICD-10-CM | POA: Diagnosis not present

## 2022-06-18 DIAGNOSIS — Z87891 Personal history of nicotine dependence: Secondary | ICD-10-CM

## 2022-06-18 DIAGNOSIS — F39 Unspecified mood [affective] disorder: Secondary | ICD-10-CM | POA: Diagnosis present

## 2022-06-18 DIAGNOSIS — R400 Somnolence: Principal | ICD-10-CM

## 2022-06-18 DIAGNOSIS — R531 Weakness: Secondary | ICD-10-CM | POA: Diagnosis present

## 2022-06-18 DIAGNOSIS — N179 Acute kidney failure, unspecified: Secondary | ICD-10-CM | POA: Diagnosis present

## 2022-06-18 DIAGNOSIS — Z86718 Personal history of other venous thrombosis and embolism: Secondary | ICD-10-CM

## 2022-06-18 DIAGNOSIS — E1165 Type 2 diabetes mellitus with hyperglycemia: Secondary | ICD-10-CM | POA: Diagnosis present

## 2022-06-18 DIAGNOSIS — E1122 Type 2 diabetes mellitus with diabetic chronic kidney disease: Secondary | ICD-10-CM

## 2022-06-18 DIAGNOSIS — Z8711 Personal history of peptic ulcer disease: Secondary | ICD-10-CM

## 2022-06-18 DIAGNOSIS — Z7982 Long term (current) use of aspirin: Secondary | ICD-10-CM

## 2022-06-18 DIAGNOSIS — E1151 Type 2 diabetes mellitus with diabetic peripheral angiopathy without gangrene: Secondary | ICD-10-CM | POA: Diagnosis present

## 2022-06-18 DIAGNOSIS — K219 Gastro-esophageal reflux disease without esophagitis: Secondary | ICD-10-CM | POA: Diagnosis present

## 2022-06-18 DIAGNOSIS — Z794 Long term (current) use of insulin: Secondary | ICD-10-CM

## 2022-06-18 DIAGNOSIS — T426X5A Adverse effect of other antiepileptic and sedative-hypnotic drugs, initial encounter: Secondary | ICD-10-CM | POA: Diagnosis present

## 2022-06-18 DIAGNOSIS — Z66 Do not resuscitate: Secondary | ICD-10-CM | POA: Diagnosis present

## 2022-06-18 DIAGNOSIS — I4891 Unspecified atrial fibrillation: Secondary | ICD-10-CM | POA: Diagnosis present

## 2022-06-18 DIAGNOSIS — J9622 Acute and chronic respiratory failure with hypercapnia: Secondary | ICD-10-CM | POA: Diagnosis not present

## 2022-06-18 DIAGNOSIS — K921 Melena: Secondary | ICD-10-CM | POA: Diagnosis present

## 2022-06-18 DIAGNOSIS — Z9981 Dependence on supplemental oxygen: Secondary | ICD-10-CM

## 2022-06-18 DIAGNOSIS — N3000 Acute cystitis without hematuria: Secondary | ICD-10-CM | POA: Diagnosis not present

## 2022-06-18 DIAGNOSIS — T424X5A Adverse effect of benzodiazepines, initial encounter: Secondary | ICD-10-CM | POA: Diagnosis present

## 2022-06-18 DIAGNOSIS — Z8701 Personal history of pneumonia (recurrent): Secondary | ICD-10-CM

## 2022-06-18 DIAGNOSIS — I5032 Chronic diastolic (congestive) heart failure: Secondary | ICD-10-CM | POA: Diagnosis present

## 2022-06-18 DIAGNOSIS — Z7189 Other specified counseling: Secondary | ICD-10-CM | POA: Diagnosis not present

## 2022-06-18 DIAGNOSIS — T447X5A Adverse effect of beta-adrenoreceptor antagonists, initial encounter: Secondary | ICD-10-CM | POA: Diagnosis not present

## 2022-06-18 DIAGNOSIS — Z1152 Encounter for screening for COVID-19: Secondary | ICD-10-CM

## 2022-06-18 DIAGNOSIS — N390 Urinary tract infection, site not specified: Secondary | ICD-10-CM | POA: Diagnosis present

## 2022-06-18 DIAGNOSIS — N189 Chronic kidney disease, unspecified: Secondary | ICD-10-CM

## 2022-06-18 DIAGNOSIS — Z8673 Personal history of transient ischemic attack (TIA), and cerebral infarction without residual deficits: Secondary | ICD-10-CM

## 2022-06-18 DIAGNOSIS — E041 Nontoxic single thyroid nodule: Secondary | ICD-10-CM | POA: Diagnosis present

## 2022-06-18 DIAGNOSIS — M549 Dorsalgia, unspecified: Secondary | ICD-10-CM | POA: Diagnosis present

## 2022-06-18 DIAGNOSIS — I712 Thoracic aortic aneurysm, without rupture, unspecified: Secondary | ICD-10-CM | POA: Diagnosis present

## 2022-06-18 DIAGNOSIS — L03116 Cellulitis of left lower limb: Secondary | ICD-10-CM | POA: Diagnosis present

## 2022-06-18 DIAGNOSIS — F17210 Nicotine dependence, cigarettes, uncomplicated: Secondary | ICD-10-CM | POA: Diagnosis present

## 2022-06-18 DIAGNOSIS — Z515 Encounter for palliative care: Secondary | ICD-10-CM | POA: Diagnosis not present

## 2022-06-18 DIAGNOSIS — M109 Gout, unspecified: Secondary | ICD-10-CM | POA: Diagnosis present

## 2022-06-18 DIAGNOSIS — K2970 Gastritis, unspecified, without bleeding: Secondary | ICD-10-CM | POA: Diagnosis not present

## 2022-06-18 DIAGNOSIS — Z888 Allergy status to other drugs, medicaments and biological substances status: Secondary | ICD-10-CM

## 2022-06-18 DIAGNOSIS — R001 Bradycardia, unspecified: Secondary | ICD-10-CM | POA: Diagnosis not present

## 2022-06-18 DIAGNOSIS — R4182 Altered mental status, unspecified: Secondary | ICD-10-CM | POA: Diagnosis present

## 2022-06-18 LAB — POC OCCULT BLOOD, ED: Fecal Occult Bld: POSITIVE — AB

## 2022-06-18 LAB — COMPREHENSIVE METABOLIC PANEL
ALT: 8 U/L (ref 0–44)
AST: 9 U/L — ABNORMAL LOW (ref 15–41)
Albumin: 2.4 g/dL — ABNORMAL LOW (ref 3.5–5.0)
Alkaline Phosphatase: 65 U/L (ref 38–126)
Anion gap: 9 (ref 5–15)
BUN: 65 mg/dL — ABNORMAL HIGH (ref 8–23)
CO2: 23 mmol/L (ref 22–32)
Calcium: 7.9 mg/dL — ABNORMAL LOW (ref 8.9–10.3)
Chloride: 110 mmol/L (ref 98–111)
Creatinine, Ser: 5.02 mg/dL — ABNORMAL HIGH (ref 0.61–1.24)
GFR, Estimated: 12 mL/min — ABNORMAL LOW (ref >60)
Glucose, Bld: 92 mg/dL (ref 70–99)
Potassium: 3.7 mmol/L (ref 3.5–5.1)
Sodium: 142 mmol/L (ref 135–145)
Total Bilirubin: 0.7 mg/dL (ref 0.3–1.2)
Total Protein: 6.1 g/dL — ABNORMAL LOW (ref 6.5–8.1)

## 2022-06-18 LAB — URINALYSIS, ROUTINE W REFLEX MICROSCOPIC
Bilirubin Urine: NEGATIVE
Glucose, UA: NEGATIVE mg/dL
Ketones, ur: NEGATIVE mg/dL
Nitrite: NEGATIVE
Protein, ur: 30 mg/dL — AB
RBC / HPF: >50 RBC/hpf — ABNORMAL HIGH (ref 0–5)
Specific Gravity, Urine: 1.01 (ref 1.005–1.030)
WBC, UA: >50 WBC/hpf — ABNORMAL HIGH (ref 0–5)
pH: 5 (ref 5.0–8.0)

## 2022-06-18 LAB — BLOOD GAS, ARTERIAL
Acid-Base Excess: 0.7 mmol/L (ref 0.0–2.0)
Bicarbonate: 26.5 mmol/L (ref 20.0–28.0)
Drawn by: 23430
FIO2: 28 %
O2 Saturation: 96.1 %
Patient temperature: 36.5
pCO2 arterial: 47 mmHg (ref 32–48)
pH, Arterial: 7.36 (ref 7.35–7.45)
pO2, Arterial: 74 mmHg — ABNORMAL LOW (ref 83–108)

## 2022-06-18 LAB — RAPID URINE DRUG SCREEN, HOSP PERFORMED
Amphetamines: NOT DETECTED
Barbiturates: NOT DETECTED
Benzodiazepines: POSITIVE — AB
Cocaine: NOT DETECTED
Opiates: POSITIVE — AB
Tetrahydrocannabinol: NOT DETECTED

## 2022-06-18 LAB — RESP PANEL BY RT-PCR (RSV, FLU A&B, COVID)  RVPGX2
Influenza A by PCR: NEGATIVE
Influenza B by PCR: NEGATIVE
Resp Syncytial Virus by PCR: NEGATIVE
SARS Coronavirus 2 by RT PCR: NEGATIVE

## 2022-06-18 LAB — CBC WITH DIFFERENTIAL/PLATELET
Abs Immature Granulocytes: 0.03 10*3/uL (ref 0.00–0.07)
Basophils Absolute: 0 10*3/uL (ref 0.0–0.1)
Basophils Relative: 0 %
Eosinophils Absolute: 0.3 10*3/uL (ref 0.0–0.5)
Eosinophils Relative: 4 %
HCT: 26.8 % — ABNORMAL LOW (ref 39.0–52.0)
Hemoglobin: 7.8 g/dL — ABNORMAL LOW (ref 13.0–17.0)
Immature Granulocytes: 0 %
Lymphocytes Relative: 5 %
Lymphs Abs: 0.4 10*3/uL — ABNORMAL LOW (ref 0.7–4.0)
MCH: 29.1 pg (ref 26.0–34.0)
MCHC: 29.1 g/dL — ABNORMAL LOW (ref 30.0–36.0)
MCV: 100 fL (ref 80.0–100.0)
Monocytes Absolute: 0.4 10*3/uL (ref 0.1–1.0)
Monocytes Relative: 6 %
Neutro Abs: 6.6 10*3/uL (ref 1.7–7.7)
Neutrophils Relative %: 85 %
Platelets: 244 10*3/uL (ref 150–400)
RBC: 2.68 MIL/uL — ABNORMAL LOW (ref 4.22–5.81)
RDW: 16.9 % — ABNORMAL HIGH (ref 11.5–15.5)
WBC: 7.8 10*3/uL (ref 4.0–10.5)
nRBC: 0 % (ref 0.0–0.2)

## 2022-06-18 LAB — CBG MONITORING, ED
Glucose-Capillary: 110 mg/dL — ABNORMAL HIGH (ref 70–99)
Glucose-Capillary: 167 mg/dL — ABNORMAL HIGH (ref 70–99)
Glucose-Capillary: 117 mg/dL — ABNORMAL HIGH (ref 70–99)
Glucose-Capillary: 386 mg/dL — ABNORMAL HIGH (ref 70–99)

## 2022-06-18 LAB — LACTIC ACID, PLASMA
Lactic Acid, Venous: 0.7 mmol/L (ref 0.5–1.9)
Lactic Acid, Venous: 1 mmol/L (ref 0.5–1.9)

## 2022-06-18 LAB — ETHANOL: Alcohol, Ethyl (B): <10 mg/dL (ref <10)

## 2022-06-18 LAB — AMMONIA: Ammonia: <10 umol/L (ref 9–35)

## 2022-06-18 LAB — MAGNESIUM: Magnesium: 2.3 mg/dL (ref 1.7–2.4)

## 2022-06-18 LAB — BRAIN NATRIURETIC PEPTIDE: B Natriuretic Peptide: 773 pg/mL — ABNORMAL HIGH (ref 0.0–100.0)

## 2022-06-18 MED ORDER — SODIUM CHLORIDE 0.9 % IV SOLN
1.0000 g | Freq: Once | INTRAVENOUS | Status: AC
  Administered 2022-06-18: 1 g via INTRAVENOUS
  Filled 2022-06-18: qty 10

## 2022-06-18 MED ORDER — PANTOPRAZOLE SODIUM 40 MG IV SOLR
40.0000 mg | Freq: Two times a day (BID) | INTRAVENOUS | Status: DC
  Administered 2022-06-22 – 2022-06-29 (×15): 40 mg via INTRAVENOUS
  Filled 2022-06-18 (×15): qty 10

## 2022-06-18 MED ORDER — SODIUM CHLORIDE 0.9 % IV SOLN: 1.0000 g | INTRAVENOUS | Status: DC

## 2022-06-18 MED ORDER — NALOXONE HCL 0.4 MG/ML IJ SOLN
0.4000 mg | Freq: Once | INTRAMUSCULAR | Status: AC
  Administered 2022-06-18: 0.4 mg via INTRAVENOUS
  Filled 2022-06-18: qty 1

## 2022-06-18 MED ORDER — PANTOPRAZOLE SODIUM 40 MG IV SOLR
40.0000 mg | Freq: Once | INTRAVENOUS | Status: AC
  Administered 2022-06-18: 40 mg via INTRAVENOUS
  Filled 2022-06-18: qty 10

## 2022-06-18 MED ORDER — NALOXONE HCL 0.4 MG/ML IJ SOLN
0.8000 mg | Freq: Once | INTRAMUSCULAR | Status: AC
  Administered 2022-06-18: 0.8 mg via INTRAVENOUS
  Filled 2022-06-18: qty 2

## 2022-06-18 MED ORDER — LACTATED RINGERS IV BOLUS
1000.0000 mL | Freq: Once | INTRAVENOUS | Status: AC
  Administered 2022-06-18: 1000 mL via INTRAVENOUS

## 2022-06-18 MED ORDER — PANTOPRAZOLE SODIUM 40 MG IV SOLR
40.0000 mg | Freq: Once | INTRAVENOUS | Status: AC
  Administered 2022-06-19: 40 mg via INTRAVENOUS
  Filled 2022-06-18: qty 10

## 2022-06-18 MED ORDER — IPRATROPIUM-ALBUTEROL 0.5-2.5 (3) MG/3ML IN SOLN
3.0000 mL | Freq: Once | RESPIRATORY_TRACT | Status: AC
  Administered 2022-06-18: 3 mL via RESPIRATORY_TRACT
  Filled 2022-06-18: qty 3

## 2022-06-18 MED ORDER — PANTOPRAZOLE INFUSION (NEW) - SIMPLE MED
8.0000 mg/h | INTRAVENOUS | Status: AC
  Administered 2022-06-18 – 2022-06-21 (×3): 8 mg/h via INTRAVENOUS
  Filled 2022-06-18 (×2): qty 100
  Filled 2022-06-18: qty 80
  Filled 2022-06-18 (×2): qty 100
  Filled 2022-06-18: qty 80
  Filled 2022-06-18: qty 100

## 2022-06-18 NOTE — Assessment & Plan Note (Addendum)
AKI on CKD stage IV.  Cr 5.02, baseline ~3.3.  Challenging to tell fluid status due to habitus. -  Hold torsemide 80 mg daily -1 L bolus given, hold further fluids for now pending renal function in a.m.

## 2022-06-18 NOTE — H&P (Signed)
History and Physical    Eric Scott XBD:[id number] DOB: 01-04-1951 DOA: 06/18/2022  PCP: [name], MD   Patient coming from: [place] have personally briefly reviewed patient's old medical records in [place]  Chief Complaint: AMS  HPI: Eric Scott is a 72 y.o. 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 At the time of my evaluation, patient is somnolent, unable to answer questions, moving extremities. Patient was brought to the ED via EMS from [facility] with reports of altered mental status.  Per triage notes and ED provider who talked to staff at facility, at about 5 PM staff noticed that patient was confused.  Reported his blood sugars ranging in the 400s subsequently dropped to 68>> 130.  It is unknown if insulin was given when his blood sugars were in the 400s, a different staff had taken over patient's care.  Glucagon was given.  He has also had chronic left lower extremity pain which is unchanged. He also got his Lantus today.  ED Course: Temperature down to 95.7.  Heart rate 59-62.  Respirate rate 18-22.  Blood pressure systolic 834-196.  O2 sats 93 to 99% on 4 L. ABG shows pH of 7.36, pCO2 47. COVID influenza RSV negative. Ammonia unremarkable less than 10. Lactic acid .7. CT negative for acute abnormality. UA Suggestive of UTI. Left tibia-fibula x-ray-diffuse subcutaneous edema consistent with cellulitis Patient had black stools in the ED.  Protonix 40 mg given. 1 L Bolus given.  IV Ceftriaxone 1 g given. 0.4 mg IV Narcan given, with some initial improvement in mental status, subsequent 0.8 mg Narcan given without significant response. Hospitalist admit for altered mental status, likely UTI.  Review of Systems: Unable to assess due to altered mental status.  Past Medical History:  Diagnosis Date   Anemia    Anxiety     Chronic pain    legs, back; MRI 05/2012 with mild thoracic degenerative changes no spinal stenosis    CKD (chronic kidney disease) 05/12/2019   Stage IV   COPD (chronic obstructive pulmonary disease) ([place])    COVID-19    Depression    Diabetic peripheral neuropathy ([name])    "chronic" (22/29/7989)   Diastolic CHF ([name])    Duodenal ulcer hemorrhage    DVT (deep venous thrombosis) ([name])    right upper arm   GERD (gastroesophageal reflux disease)    Hypercholesteremia    Hypertension    Iron deficiency anemia    Osteomyelitis of ankle ([name])    Peripheral edema    PVD (peripheral vascular disease) ([place])    Spinal stenosis    mild lumbar (MRI 05/2012)-L2-L3 to L4-L5 , mild lumbar foraminal stenosis    Stroke ([place]) 05/2012   Subacute, lacunar infarcts within the left basal ganglia and posterior limp of the left internal capsule/thalamus; "RUE; both feet weak" (05/27/2012)   Tremor    Type II diabetes mellitus ([place])     Past Surgical History:  Procedure Laterality Date   ANKLE SURGERY     APPLICATION OF WOUND VAC Left 03/28/2022   Procedure: APPLICATION OF WOUND VAC;  Surgeon: [name], MD;  Location: [facility];  Service: Orthopedics;  Laterality: Left;   ESOPHAGOGASTRODUODENOSCOPY (EGD) WITH PROPOFOL N/A 12/21/2016   [place]: Ulcerative reflux esophagitis, erosive gastropathy, extensive duodenal ulceration likely site of bleeding.  Pathology with mild gastritis, no H. pylori  ESOPHAGOGASTRODUODENOSCOPY (EGD) WITH PROPOFOL N/A 04/02/2017   Fields: ESOPAHGITIS/GASTRIC AND DUODENAL ULCERS HEALED. mild duodenitis   HARDWARE REMOVAL Left 03/28/2022   Procedure: LEFT ANKLE REMOVAL DEEP HARDWARE, REMOVE BONE AND APPLY TISSUE GRAFT;  Surgeon: [name], MD;  Location: [facility];  Service: Orthopedics;  Laterality: Left;   HERNIA REPAIR  01/05/2004   "belly button" (05/27/2012)   IR FLUORO GUIDE CV LINE RIGHT  04/03/2022   IR REMOVAL TUN CV CATH W/O FL  05/10/2022   IR US GUIDE VASC  ACCESS RIGHT  04/03/2022     reports that he has been smoking cigarettes. He has a 5.40 pack-year smoking history. He has never used smokeless tobacco. He reports that he does not drink alcohol and does not use drugs.  Allergies  Allergen Reactions   Baclofen     unknown   Blueberry Flavor Other (See Comments)    Unknown   Cucumber Extract Other (See Comments)    "Fells like I'm having a heart attack"   Flexeril [Cyclobenzaprine Hcl] Other (See Comments)    "whole body  Tremors" (05/27/2012)   Kiwi Extract Other (See Comments)    "feels like I'm having a heart attack"    Family History  Problem Relation Age of Onset   Colon cancer Neg Hx     Prior to Admission medications   Medication Sig Start Date End Date Taking? Authorizing Provider  acetaminophen (TYLENOL) 325 MG tablet Take 2 tablets (650 mg total) by mouth every 6 (six) hours as needed for mild pain, fever or headache (or Fever >/= 101). 04/10/22   Elgergawy, Silver Huguenin, MD  albuterol (PROVENTIL) (2.5 MG/3ML) 0.083% nebulizer solution Take 3 mLs (2.5 mg total) by nebulization 3 (three) times daily. 04/20/22   Ghimire, Henreitta Leber, MD  albuterol (PROVENTIL) (2.5 MG/3ML) 0.083% nebulizer solution Take 3 mLs (2.5 mg total) by nebulization every 6 (six) hours as needed for wheezing or shortness of breath. 04/20/22   Ghimire, Henreitta Leber, MD  ALPRAZolam Duanne Moron) 0.5 MG tablet Take 1 tablet (0.5 mg total) by mouth at bedtime. 04/20/22   Ghimire, Henreitta Leber, MD  Amino Acids-Protein Hydrolys (PRO-STAT AWC) LIQD Take 30 mLs by mouth daily.    [provider]  amiodarone (PACERONE) 200 MG tablet Continue amiodarone 200 mg BID or 8 days (16 more doses), then transition to amiodarone 200 mg daily. Patient taking differently: Continue amiodarone 200 mg BID for 8 days (16 more doses) until 04/28/2022, then transition to amiodarone 200 mg daily (start 04/28/2022). 04/10/22   Elgergawy, Silver Huguenin, MD  amLODipine (NORVASC) 10 MG tablet Take 1  tablet (10 mg total) by mouth daily. 05/21/19   Roxan Hockey, MD  apixaban (ELIQUIS) 5 MG TABS tablet Take 1 tablet (5 mg total) by mouth 2 (two) times daily. 04/10/22   Elgergawy, Silver Huguenin, MD  ascorbic acid (VITAMIN C) 500 MG tablet Take 500 mg by mouth 2 (two) times daily.    [provider]  aspirin 81 MG chewable tablet Chew 81 mg by mouth daily. Swallow whole.    [provider]  BREO ELLIPTA 100-25 MCG/INH AEPB Inhale 1 puff into the lungs daily.  09/27/16   [provider]  calcitRIOL (ROCALTROL) 0.25 MCG capsule Take 0.25 mcg by mouth daily.    [provider]  calcium carbonate (TUMS - DOSED IN MG ELEMENTAL CALCIUM) 500 MG chewable tablet Chew 2 tablets by mouth 2 (two) times daily.    [provider]  cholecalciferol (VITAMIN  D) 1000 units tablet Take 1,000 Units by mouth daily.    [provider]  coconut oil OIL Apply 1 Application topically every 6 (six) hours as needed (protection and prevention).    [provider]  colchicine 0.6 MG tablet Take 0.5 tablets (0.3 mg total) by mouth daily. 04/10/22   Elgergawy, Silver Huguenin, MD  cyanocobalamin 100 MCG tablet Take 1,000 mcg by mouth daily.    [provider]  ferrous sulfate 325 (65 FE) MG tablet Take 325 mg by mouth 2 (two) times daily.    [provider]  FLUoxetine (PROZAC) 40 MG capsule Take 40 mg by mouth daily.    [provider]  gabapentin (NEURONTIN) 300 MG capsule Take 1 capsule (300 mg total) by mouth at bedtime. 12/21/19   Barton Dubois, MD  guaiFENesin 200 MG tablet Take 200 mg by mouth 2 (two) times daily.    [provider]  HUMALOG KWIKPEN 100 UNIT/ML KwikPen Inject into the skin. Per sliding scale 04/20/22   [provider]  HYDROcodone-acetaminophen (NORCO) 7.5-325 MG tablet Take 1 tablet by mouth 4 (four) times daily. 04/20/22   Ghimire, Henreitta Leber, MD  insulin aspart (NOVOLOG) 100 UNIT/ML injection Inject 2-10  Units into the skin See admin instructions. Sliding scale if 200-250 = 2 units, 251-300 = 4 units, 301-350 = 6 units, 351-400 = 8 units, 401-500 units = 10 units before meals and at betime Patient not taking: Reported on 04/24/2022    [provider]  ipratropium-albuterol (DUONEB) 0.5-2.5 (3) MG/3ML SOLN Take 3 mLs by nebulization every 4 (four) hours as needed (shortness of breath / wheezing).    [provider]  LANTUS 100 UNIT/ML injection Inject 0.22 mLs (22 Units total) into the skin at bedtime. 04/10/22   Elgergawy, Silver Huguenin, MD  magnesium oxide (MAG-OX) 400 MG tablet Take 400 mg by mouth daily.    [provider]  metoprolol tartrate (LOPRESSOR) 25 MG tablet Take 1 tablet (25 mg total) by mouth 2 (two) times daily. 04/10/22   Elgergawy, Silver Huguenin, MD  Nutritional Supplement LIQD Take 120 mLs by mouth daily. "House supplement"    [provider]  pantoprazole (PROTONIX) 40 MG tablet Take 1 tablet (40 mg total) by mouth daily before breakfast. 05/21/19   Karilynn Carranza, Courage, MD  polyethylene glycol (MIRALAX / GLYCOLAX) 17 g packet Take 17 g by mouth daily. For constipation    [provider]  senna (SENOKOT) 8.6 MG tablet Take 2 tablets by mouth 2 (two) times daily.    [provider]  senna-docusate (SENOKOT-S) 8.6-50 MG tablet Take 1 tablet by mouth at bedtime as needed for mild constipation. 04/20/22   Ghimire, Henreitta Leber, MD  sodium zirconium cyclosilicate (LOKELMA) 10 g PACK packet Take 10 g by mouth 3 (three) times daily. 04/20/22   Ghimire, Henreitta Leber, MD  tamsulosin (FLOMAX) 0.4 MG CAPS capsule Take 1 capsule (0.4 mg total) by mouth daily after supper. Patient taking differently: Take 0.4 mg by mouth daily. 11/18/19   Roxan Hockey, MD  tiotropium (SPIRIVA HANDIHALER) 18 MCG inhalation capsule Place 1 capsule (18 mcg total) into inhaler and inhale daily. 04/20/22 04/20/23  Ghimire, Henreitta Leber, MD  torsemide (DEMADEX) 20 MG tablet Take 4  tablets (80 mg total) by mouth daily. 04/20/22   Jonetta Osgood, MD    Physical Exam: Limited exam due to altered mental status Vitals:   06/18/22 1930 06/18/22 1945 06/18/22 2000 06/18/22 2021  BP: 124/70  Marland Kitchen)  119/52   Pulse: 60 61 (!) 59   Resp:  19 18   Temp:    (!) 95.7 F (35.4 C)  TempSrc:    Rectal  SpO2: 93% 94% 97%     Constitutional: Somnolent Vitals:   06/18/22 1930 06/18/22 1945 06/18/22 2000 06/18/22 2021  BP: 124/70  (!) 119/52   Pulse: 60 61 (!) 59   Resp:  19 18   Temp:    (!) 95.7 F (35.4 C)  TempSrc:    Rectal  SpO2: 93% 94% 97%    Eyes: PERRL, lids and conjunctivae normal ENMT: Mucous membranes are moist.  Neck: normal, supple, no masses, no thyromegaly Respiratory: clear to auscultation bilaterally, no wheezing, no crackles.  Cardiovascular: Regular rate and rhythm, no murmurs / rubs / gallops.  Chronic stasis changes.  Abdomen: no tenderness, no masses palpated. No hepatosplenomegaly. Bowel sounds positive.  Musculoskeletal: no clubbing / cyanosis. No joint deformity upper and lower extremities. Good ROM, no contractures. Normal muscle tone.  Skin: Chronic stasis changes to bilateral lower extremity, appears chronic. Neurologic: CN 2-12 grossly intact. Sensation intact, DTR normal. Strength 5/5 in all 4.  Psychiatric: Normal judgment and insight. Alert and oriented x 3. Normal mood.     Labs on Admission: I have personally reviewed following labs and imaging studies  CBC: Recent Labs  Lab 06/18/22 1917  WBC 7.8  NEUTROABS 6.6  HGB 7.8*  HCT 26.8*  MCV 100.0  PLT 700   Basic Metabolic Panel: Recent Labs  Lab 06/18/22 1917  NA 142  K 3.7  CL 110  CO2 23  GLUCOSE 92  BUN 65*  CREATININE 5.02*  CALCIUM 7.9*   GFR: CrCl cannot be calculated (Unknown ideal weight.). Liver Function Tests: Recent Labs  Lab 06/18/22 1917  AST 9*  ALT 8  ALKPHOS 65  BILITOT 0.7  PROT 6.1*  ALBUMIN 2.4*   No results for input(s): "LIPASE",  "AMYLASE" in the last 168 hours. Recent Labs  Lab 06/18/22 1917  AMMONIA <10   CBG: Recent Labs  Lab 06/18/22 1836 06/18/22 1927  GLUCAP 110* 167*    Radiological Exams on Admission: DG Chest 1 View  Result Date: 06/18/2022 CLINICAL DATA:  Altered mental status. EXAM: CHEST  1 VIEW COMPARISON:  Chest radiograph 04/12/2022, CT chest 04/12/2022 FINDINGS: The heart is enlarged.  The upper mediastinal contours are normal. Previously seen opacity in the left upper lobe has essentially resolved, with mild residual patchy opacity which may reflect residual scarring. There is no new or worsening focal airspace disease. There are diffusely increased interstitial markings possibly reflecting mild pulmonary interstitial edema. There is no significant pleural effusion. There is no pneumothorax. Note that the smaller nodular opacity seen on the prior chest CT are not seen on the current study. There is no acute osseous abnormality. IMPRESSION: 1. Cardiomegaly with possible mild pulmonary interstitial edema. 2. Essentially resolved airspace opacity in the left upper lobe with probable residual scarring. 3. Additional smaller nodules seen on the prior chest CT are not seen on the current study. Please refer to that study for follow-up recommendations. Electronically Signed   By: Valetta Mole M.D.   On: 06/18/2022 20:09   DG Tibia/Fibula Left  Result Date: 06/18/2022 CLINICAL DATA:  Left lower extremity erythema EXAM: LEFT TIBIA AND FIBULA - 2 VIEW COMPARISON:  03/22/2022 FINDINGS: Frontal and lateral views of the left tibia and fibula are obtained. There is a residual screw traversing the medial malleolus. K-wire within the  medial malleolus and the plate and screw fixation across the distal fibula have been removed in the interim. New heterotopic ossification along the dorsal margin of the distal fibula. There are no acute displaced fractures. The bones are diffusely osteopenic. No evidence of bony destruction  or periosteal reaction to suggest osteomyelitis. There is diffuse subcutaneous edema. IMPRESSION: 1. Diffuse subcutaneous edema consistent with cellulitis. 2. No radiographic evidence of osteomyelitis. 3. Diffuse osteopenia. 4. Revision of orthopedic hardware about the left ankle as above. Electronically Signed   By: Randa Ngo M.D.   On: 06/18/2022 20:06   CT Head Wo Contrast  Result Date: 06/18/2022 CLINICAL DATA:  Altered mental status EXAM: CT HEAD WITHOUT CONTRAST TECHNIQUE: Contiguous axial images were obtained from the base of the skull through the vertex without intravenous contrast. RADIATION DOSE REDUCTION: This exam was performed according to the departmental dose-optimization program which includes automated exposure control, adjustment of the mA and/or kV according to patient size and/or use of iterative reconstruction technique. COMPARISON:  05/17/2019 FINDINGS: Brain: No mass, hemorrhage or extra-axial collection. There is periventricular hypoattenuation compatible with chronic microvascular disease. 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 Generalized volume loss. Vascular: Atherosclerotic calcification of the internal carotid and vertebral arteries at skull base. Skull: Normal. Negative for fracture or focal lesion. Sinuses/Orbits: No acute finding. Other: None. IMPRESSION: 1. No acute intracranial abnormality. 2. 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 Electronically Signed   By: Ulyses Jarred M.D.   On: 06/18/2022 19:50    EKG: Independently reviewed.  Atrial flutter rate 63.  QTc prolonged at 525.  No significant change from prior.  Assessment/Plan Principal Problem:   Acute metabolic encephalopathy Active Problems:   AKI (acute kidney injury) ([place])   Acute on chronic anemia   Stage 4 chronic kidney disease ([name])   UTI (urinary tract infection)   Type 2 diabetes mellitus with stage 4 chronic kidney disease, with  [place]-term current use of insulin ([name])   HTN (hypertension)   Chronic venous insufficiency   Atrial flutter, paroxysmal ([name])   Chronic heart failure with preserved ejection fraction (HFpEF) ([name])   COPD  GOLD ?  / AB    Assessment and Plan: * Acute metabolic encephalopathy Patient is somnolent.  Likely multifactorial from AKI, UTI, polypharmacy in the setting of impaired renal function.  Head CT negative for acute abnormality.  X-ray of left lower extremity shows diffuse subcutaneous edema consistent with cellulitis, physical exam findings suggest chronic stasis changes. -ABG shows pH of 7.3, pCO2 of 47. -IV antibiotics IV fluids. -N.p.o. -Hold gabapentin, fluoxetine, Norco,  UTI (urinary tract infection) Rules out for sepsis.  UA suggestive of UTI with positive leukocytes.  No urine cultures on file. -IV ceftriaxone -Follow-up cultures  Acute on chronic anemia Hgb - 7.8, recent baseline 9-10 . On Eliquis.  Black tarry stools in ED.  History of peptic ulcer disease, duodenal ulcer. -Repeat CBC q6h x 3 - Transfuse for hemoglobin  < 7.5 -Hold Eliquis -IV Protonix bolus and drip -GI consult in a.m. - n.p.o.  AKI (acute kidney injury) ([place]) AKI on CKD stage IV.  Cr 5.02, baseline ~3.3.  Challenging to tell fluid status due to habitus. -  Hold torsemide 80 mg daily -1 L bolus given, hold further fluids for now pending renal function in a.m.  COPD  GOLD ?  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 -DuoNebs as needed  Chronic heart failure with  preserved ejection fraction (HFpEF) ([place]) Appears stable and compensated.  But challenging to tell volume status due to habitus.  Presents with AKI.  Weights per chart appears stable.  Chest x-ray showing possible mild pulmonary interstitial edema.  Echo 04/2022-EF 60 to 65%. -Check BNP -Hold torsemide for now -1 L bolus given, hold further fluids for now pending renal function in a.m.  Atrial flutter, paroxysmal ([name]) Rate  controlled on anticoagulation. -Hold amiodarone, metoprolol for now pending improvement in mental status -Hold Eliquis with black stools  HTN (hypertension) Stable. -Hold Norvasc, metoprolol, tamsulosin, torsemide while n.p.o and in the setting of GI bleed.  Type 2 diabetes mellitus with stage 4 chronic kidney disease, with [place]-term current use of insulin ([place]) Per nursing home staff, initial hyperglycemia 400  >>> hypoglycemia of 68.  In the setting of AKI on CKD.  Per nursing home staff he received Lantus today. - SSI- S q4h for now -Hold home Lantus   DVT prophylaxis: SCDS Code Status: FULL-no documentation at bedside from nursing home stating otherwise. Family Communication: None at bedside Disposition Plan: > 2 days Consults called: None  Admission status: Obs tele  I certify that at the point of admission it is my clinical judgment that the patient will require inpatient hospital care spanning beyond 2 midnights from the point of admission due to high intensity of service, high risk for further deterioration and high frequency of surveillance required.    Author: Bethena Roys, MD 06/18/2022 11:02 PM  For on call review [email or web address].

## 2022-06-18 NOTE — ED Notes (Signed)
Pt very lethargic. First dose of narcan given. Pt soon after became alert. Able to state his name, president, and location. Pt then stated he needed to void and then had a BM. Pt cleaned up. EDP came to room to see stool, occult tested positive. Pt then became less responsive again, more narcan given.

## 2022-06-18 NOTE — Assessment & Plan Note (Signed)
Rate controlled on anticoagulation. -Hold amiodarone, metoprolol for now pending improvement in mental status -Hold Eliquis with black stools

## 2022-06-18 NOTE — ED Triage Notes (Signed)
Pt arrives from [facility] d/t ams starting around 6pm. Cbg at 5pm was 68, gave glucagon bbut pt continues to drop. Pale. 94% on 3L on ems arrival.   Ems gave oral glucose and D10 - cbg 110 on arrival. Pt continues to be lethargic and altered at this time

## 2022-06-18 NOTE — ED Provider Notes (Signed)
[facility] EMERGENCY DEPARTMENT Provider Note   CSN: [id number] Arrival date & time: 06/18/22  1824     History  Chief Complaint  Patient presents with   Altered Mental Status    Eric Scott is a 72 y.o. male.  HPI 72 year old male presents with altered mental status and hypoglycemia.  Per the nurse, patient was found to be altered at 5 PM and CBG was 68.  At the facility they gave him glucagon but his glucose kept dropping.  He was on 3 L at 94% on EMS arrival.  EMS gave oral glucose and D10 and his CBG is 110.  He was speaking to EMS though currently is lethargic.  History is limited as the patient is unable to provide much of a history.  Patient tells me his left leg is hurting him (when I asked him to move it) though it's unclear for how Plate. It is currently wrapped.  9:38 PM I spoke to staff at SNF: BS went from 400 to 70 (unclear if he was given insulin or not), then went up to 130, then back down to 68, then confused. Gave glucagon. O2 sats 88% on 4L. Normally on 4L. The left leg pain is a chronic problem, he has chronic left leg pain/wrapping. However she did not initially take care of him during this episode so she is otherwise limited on history she can provide.   Home Medications Prior to Admission medications   Medication Sig Start Date End Date Taking? Authorizing Provider  acetaminophen (TYLENOL) 325 MG tablet Take 2 tablets (650 mg total) by mouth every 6 (six) hours as needed for mild pain, fever or headache (or Fever >/= 101). 04/10/22   Elgergawy, Silver Huguenin, MD  albuterol (PROVENTIL) (2.5 MG/3ML) 0.083% nebulizer solution Take 3 mLs (2.5 mg total) by nebulization 3 (three) times daily. 04/20/22   Ghimire, Henreitta Leber, MD  albuterol (PROVENTIL) (2.5 MG/3ML) 0.083% nebulizer solution Take 3 mLs (2.5 mg total) by nebulization every 6 (six) hours as needed for wheezing or shortness of breath. 04/20/22   Ghimire, Henreitta Leber, MD  ALPRAZolam Duanne Moron) 0.5 MG tablet Take 1  tablet (0.5 mg total) by mouth at bedtime. 04/20/22   Ghimire, Henreitta Leber, MD  Amino Acids-Protein Hydrolys (PRO-STAT AWC) LIQD Take 30 mLs by mouth daily.    [provider]  amiodarone (PACERONE) 200 MG tablet Continue amiodarone 200 mg BID or 8 days (16 more doses), then transition to amiodarone 200 mg daily. Patient taking differently: Continue amiodarone 200 mg BID for 8 days (16 more doses) until 04/28/2022, then transition to amiodarone 200 mg daily (start 04/28/2022). 04/10/22   Elgergawy, Silver Huguenin, MD  amLODipine (NORVASC) 10 MG tablet Take 1 tablet (10 mg total) by mouth daily. 05/21/19   Roxan Hockey, MD  apixaban (ELIQUIS) 5 MG TABS tablet Take 1 tablet (5 mg total) by mouth 2 (two) times daily. 04/10/22   Elgergawy, Silver Huguenin, MD  ascorbic acid (VITAMIN C) 500 MG tablet Take 500 mg by mouth 2 (two) times daily.    [provider]  aspirin 81 MG chewable tablet Chew 81 mg by mouth daily. Swallow whole.    [provider]  BREO ELLIPTA 100-25 MCG/INH AEPB Inhale 1 puff into the lungs daily.  09/27/16   [provider]  calcitRIOL (ROCALTROL) 0.25 MCG capsule Take 0.25 mcg by mouth daily.    [provider]  calcium carbonate (TUMS - DOSED IN MG ELEMENTAL CALCIUM) 500 MG chewable  tablet Chew 2 tablets by mouth 2 (two) times daily.    [provider]  cholecalciferol (VITAMIN D) 1000 units tablet Take 1,000 Units by mouth daily.    [provider]  coconut oil OIL Apply 1 Application topically every 6 (six) hours as needed (protection and prevention).    [provider]  colchicine 0.6 MG tablet Take 0.5 tablets (0.3 mg total) by mouth daily. 04/10/22   Elgergawy, Silver Huguenin, MD  cyanocobalamin 100 MCG tablet Take 1,000 mcg by mouth daily.    [provider]  ferrous sulfate 325 (65 FE) MG tablet Take 325 mg by mouth 2 (two) times daily.    [provider]  FLUoxetine (PROZAC) 40 MG capsule Take 40 mg by  mouth daily.    [provider]  gabapentin (NEURONTIN) 300 MG capsule Take 1 capsule (300 mg total) by mouth at bedtime. 12/21/19   Barton Dubois, MD  guaiFENesin 200 MG tablet Take 200 mg by mouth 2 (two) times daily.    [provider]  HUMALOG KWIKPEN 100 UNIT/ML KwikPen Inject into the skin. Per sliding scale 04/20/22   [provider]  HYDROcodone-acetaminophen (NORCO) 7.5-325 MG tablet Take 1 tablet by mouth 4 (four) times daily. 04/20/22   Ghimire, Henreitta Leber, MD  insulin aspart (NOVOLOG) 100 UNIT/ML injection Inject 2-10 Units into the skin See admin instructions. Sliding scale if 200-250 = 2 units, 251-300 = 4 units, 301-350 = 6 units, 351-400 = 8 units, 401-500 units = 10 units before meals and at betime Patient not taking: Reported on 04/24/2022    [provider]  ipratropium-albuterol (DUONEB) 0.5-2.5 (3) MG/3ML SOLN Take 3 mLs by nebulization every 4 (four) hours as needed (shortness of breath / wheezing).    [provider]  LANTUS 100 UNIT/ML injection Inject 0.22 mLs (22 Units total) into the skin at bedtime. 04/10/22   Elgergawy, Silver Huguenin, MD  magnesium oxide (MAG-OX) 400 MG tablet Take 400 mg by mouth daily.    [provider]  metoprolol tartrate (LOPRESSOR) 25 MG tablet Take 1 tablet (25 mg total) by mouth 2 (two) times daily. 04/10/22   Elgergawy, Silver Huguenin, MD  Nutritional Supplement LIQD Take 120 mLs by mouth daily. "House supplement"    [provider]  pantoprazole (PROTONIX) 40 MG tablet Take 1 tablet (40 mg total) by mouth daily before breakfast. 05/21/19   Emokpae, Courage, MD  polyethylene glycol (MIRALAX / GLYCOLAX) 17 g packet Take 17 g by mouth daily. For constipation    [provider]  senna (SENOKOT) 8.6 MG tablet Take 2 tablets by mouth 2 (two) times daily.    [provider]  senna-docusate (SENOKOT-S) 8.6-50 MG tablet Take 1 tablet by mouth at bedtime as needed for mild constipation.  04/20/22   Ghimire, Henreitta Leber, MD  sodium zirconium cyclosilicate (LOKELMA) 10 g PACK packet Take 10 g by mouth 3 (three) times daily. 04/20/22   Ghimire, Henreitta Leber, MD  tamsulosin (FLOMAX) 0.4 MG CAPS capsule Take 1 capsule (0.4 mg total) by mouth daily after supper. Patient taking differently: Take 0.4 mg by mouth daily. 11/18/19   Roxan Hockey, MD  tiotropium (SPIRIVA HANDIHALER) 18 MCG inhalation capsule Place 1 capsule (18 mcg total) into inhaler and inhale daily. 04/20/22 04/20/23  Ghimire, Henreitta Leber, MD  torsemide (DEMADEX) 20 MG tablet Take 4 tablets (80 mg total) by mouth daily. 04/20/22   Ghimire, Henreitta Leber, MD      Allergies  Baclofen, Blueberry flavor, Cucumber extract, Flexeril [cyclobenzaprine hcl], and Kiwi extract    Review of Systems   Review of Systems  Unable to perform ROS: Mental status change    Physical Exam Updated Vital Signs BP (!) 119/52   Pulse (!) 59   Temp (!) 95.7 F (35.4 C) (Rectal)   Resp 18   SpO2 97%  Physical Exam Vitals and nursing note reviewed.  Constitutional:      Appearance: He is well-developed. He is obese.  HENT:     Head: Normocephalic and atraumatic.  Cardiovascular:     Rate and Rhythm: Normal rate and regular rhythm.     Heart sounds: Normal heart sounds.  Pulmonary:     Effort: Pulmonary effort is normal.     Breath sounds: Normal breath sounds.  Abdominal:     Palpations: Abdomen is soft.     Tenderness: There is no abdominal tenderness.  Musculoskeletal:     Comments: Left lower leg has chronic appearing dark redness. No increased warmth on my exam  Skin:    General: Skin is warm and dry.  Neurological:     Mental Status: He is alert.     Comments: Patient is snoring. He does awaken to painful stimuli and will speak to me. He has equal grip strength in both hands.     ED Results / Procedures / Treatments   Labs (all labs ordered are listed, but only abnormal results are displayed) Labs Reviewed   COMPREHENSIVE METABOLIC PANEL - Abnormal; Notable for the following components:      Result Value   BUN 65 (*)    Creatinine, Ser 5.02 (*)    Calcium 7.9 (*)    Total Protein 6.1 (*)    Albumin 2.4 (*)    AST 9 (*)    GFR, Estimated 12 (*)    All other components within normal limits  CBC WITH DIFFERENTIAL/PLATELET - Abnormal; Notable for the following components:   RBC 2.68 (*)    Hemoglobin 7.8 (*)    HCT 26.8 (*)    MCHC 29.1 (*)    RDW 16.9 (*)    Lymphs Abs 0.4 (*)    All other components within normal limits  BLOOD GAS, ARTERIAL - Abnormal; Notable for the following components:   pO2, Arterial 74 (*)    All other components within normal limits  URINALYSIS, ROUTINE W REFLEX MICROSCOPIC - Abnormal; Notable for the following components:   APPearance CLOUDY (*)    Hgb urine dipstick LARGE (*)    Protein, ur 30 (*)    Leukocytes,Ua LARGE (*)    RBC / HPF >50 (*)    WBC, UA >50 (*)    Bacteria, UA RARE (*)    All other components within normal limits  RAPID URINE DRUG SCREEN, HOSP PERFORMED - Abnormal; Notable for the following components:   Opiates POSITIVE (*)    Benzodiazepines POSITIVE (*)    All other components within normal limits  CBG MONITORING, ED - Abnormal; Notable for the following components:   Glucose-Capillary 110 (*)    All other components within normal limits  CBG MONITORING, ED - Abnormal; Notable for the following components:   Glucose-Capillary 167 (*)    All other components within normal limits  CBG MONITORING, ED - Abnormal; Notable for the following components:   Glucose-Capillary 117 (*)    All other components within normal limits  POC OCCULT BLOOD, ED - Abnormal; Notable for the following components:  Fecal Occult Bld POSITIVE (*)    All other components within normal limits  RESP PANEL BY RT-PCR (RSV, FLU A&B, COVID)  [id number]  AMMONIA  ETHANOL  LACTIC ACID, PLASMA  LACTIC ACID, PLASMA  CBG MONITORING, ED  CBG MONITORING, ED  TYPE  AND SCREEN    EKG EKG Interpretation  Date/Time:  Monday June 18 2022 18:58:35 EST Ventricular Rate:  63 PR Interval:    QRS Duration: 124 QT Interval:  512 QTC Calculation: 525 R Axis:   -27 Text Interpretation: Atrial flutter with varied AV block, Left bundle branch block similar to Nov 2023 Confirmed by Sherwood Gambler [phone]) on 06/18/2022 7:19:43 PM  Radiology DG Chest 1 View  Result Date: 06/18/2022 CLINICAL DATA:  Altered mental status. EXAM: CHEST  1 VIEW COMPARISON:  Chest radiograph 04/12/2022, CT chest 04/12/2022 FINDINGS: The heart is enlarged.  The upper mediastinal contours are normal. Previously seen opacity in the left upper lobe has essentially resolved, with mild residual patchy opacity which may reflect residual scarring. There is no new or worsening focal airspace disease. There are diffusely increased interstitial markings possibly reflecting mild pulmonary interstitial edema. There is no significant pleural effusion. There is no pneumothorax. Note that the smaller nodular opacity seen on the prior chest CT are not seen on the current study. There is no acute osseous abnormality. IMPRESSION: 1. Cardiomegaly with possible mild pulmonary interstitial edema. 2. Essentially resolved airspace opacity in the left upper lobe with probable residual scarring. 3. Additional smaller nodules seen on the prior chest CT are not seen on the current study. Please refer to that study for follow-up recommendations. Electronically Signed   By: Valetta Mole M.D.   On: 06/18/2022 20:09   DG Tibia/Fibula Left  Result Date: 06/18/2022 CLINICAL DATA:  Left lower extremity erythema EXAM: LEFT TIBIA AND FIBULA - 2 VIEW COMPARISON:  03/22/2022 FINDINGS: Frontal and lateral views of the left tibia and fibula are obtained. There is a residual screw traversing the medial malleolus. K-wire within the medial malleolus and the plate and screw fixation across the distal fibula have been removed in the  interim. New heterotopic ossification along the dorsal margin of the distal fibula. There are no acute displaced fractures. The bones are diffusely osteopenic. No evidence of bony destruction or periosteal reaction to suggest osteomyelitis. There is diffuse subcutaneous edema. IMPRESSION: 1. Diffuse subcutaneous edema consistent with cellulitis. 2. No radiographic evidence of osteomyelitis. 3. Diffuse osteopenia. 4. Revision of orthopedic hardware about the left ankle as above. Electronically Signed   By: Randa Ngo M.D.   On: 06/18/2022 20:06   CT Head Wo Contrast  Result Date: 06/18/2022 CLINICAL DATA:  Altered mental status EXAM: CT HEAD WITHOUT CONTRAST TECHNIQUE: Contiguous axial images were obtained from the base of the skull through the vertex without intravenous contrast. RADIATION DOSE REDUCTION: This exam was performed according to the departmental dose-optimization program which includes automated exposure control, adjustment of the mA and/or kV according to patient size and/or use of iterative reconstruction technique. COMPARISON:  05/17/2019 FINDINGS: Brain: No mass, hemorrhage or extra-axial collection. There is periventricular hypoattenuation compatible with chronic microvascular disease. 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 Generalized volume loss. Vascular: Atherosclerotic calcification of the internal carotid and vertebral arteries at skull base. Skull: Normal. Negative for fracture or focal lesion. Sinuses/Orbits: No acute finding. Other: None. IMPRESSION: 1. No acute intracranial abnormality. 2. 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 Electronically Signed   By:  Ulyses Jarred M.D.   On: 06/18/2022 19:50    Procedures .Critical Care  Performed by: Sherwood Gambler, MD Authorized by: Sherwood Gambler, MD   Critical care provider statement:    Critical care time (minutes):  45   Critical care time was exclusive of:   Separately billable procedures and treating other patients   Critical care was necessary to treat or prevent imminent or life-threatening deterioration of the following conditions:  Respiratory failure and CNS failure or compromise   Critical care was time spent personally by me on the following activities:  Development of treatment plan with patient or surrogate, discussions with consultants, evaluation of patient's response to treatment, examination of patient, ordering and review of laboratory studies, ordering and review of radiographic studies, ordering and performing treatments and interventions, pulse oximetry, re-evaluation of patient's condition and review of old charts     Medications Ordered in ED Medications  cefTRIAXone (ROCEPHIN) 1 g in sodium chloride 0.9 % 100 mL IVPB (has no administration in time range)  lactated ringers bolus 1,000 mL (1,000 mLs Intravenous New Bag/Given 06/18/22 2023)  naloxone [facility]) injection 0.4 mg (0.4 mg Intravenous Given 06/18/22 2024)  pantoprazole (PROTONIX) injection 40 mg (40 mg Intravenous Given 06/18/22 2058)  naloxone [facility]) injection 0.8 mg (0.8 mg Intravenous Given 06/18/22 2058)    ED Course/ Medical Decision Making/ A&P                             Medical Decision Making Amount and/or Complexity of Data Reviewed Labs: ordered.    Details: AKI on CKD.  Normal lactate.  Acute on chronic anemia with stool being occult blood positive.  No hypoglycemia here. Radiology: ordered and independent interpretation performed.    Details: CT head without head bleed, chest x-ray without obvious infiltrate. ECG/medicine tests: independent interpretation performed.    Details: Atrial flutter, not new based on patient's chart.  Risk Prescription drug management. Decision regarding hospitalization.   Patient is found to have somnolence that I think is a combination of his multiple sedating meds such as benzos and narcotics as well as an acute on  chronic kidney injury.  He was started on some fluids.  Later his urinalysis has large leukocytes and so I think treating for UTI while waiting on the culture is reasonable.  He has anemia with heme positive stool during the rectal exam.  He did briefly seem to have more alertness after some Narcan though these were very brief.  Consider Narcan drip.  Overall he is protecting his airway and his ABG is okay besides some hypoxia.  He is currently on supplemental O2.  Discussed with hospitalist, Dr. Denton Brick for admission.        Final Clinical Impression(s) / ED Diagnoses Final diagnoses:  Somnolence  Acute kidney injury superimposed on chronic kidney disease [facility])    Rx / DC Orders ED Discharge Orders     None         Sherwood Gambler, MD 06/18/22 2138

## 2022-06-18 NOTE — Assessment & Plan Note (Addendum)
Appears stable and compensated.  But challenging to tell volume status due to habitus.  Presents with AKI.  Weights per chart appears stable.  Chest x-ray showing possible mild pulmonary interstitial edema.  Echo 04/2022-EF 60 to 65%. -Check BNP -Hold torsemide for now -1 L bolus given, hold further fluids for now pending renal function in a.m.

## 2022-06-18 NOTE — ED Notes (Signed)
Pt has been cleaned up and full bed change. Pt is on a malewick and given plenty of warm blankets. Pt resting at this time.

## 2022-06-18 NOTE — Assessment & Plan Note (Addendum)
Stable. -Hold Norvasc, metoprolol, tamsulosin, torsemide while n.p.o and in the setting of GI bleed.

## 2022-06-18 NOTE — Assessment & Plan Note (Addendum)
Patient is somnolent.  Likely multifactorial from AKI, UTI, polypharmacy in the setting of impaired renal function.  Head CT negative for acute abnormality.  X-ray of left lower extremity shows diffuse subcutaneous edema consistent with cellulitis, physical exam findings suggest chronic stasis changes. -ABG shows pH of 7.3, pCO2 of 47. -IV antibiotics IV fluids. -N.p.o. -Hold gabapentin, fluoxetine, Norco,

## 2022-06-18 NOTE — Assessment & Plan Note (Signed)
Hgb - 7.8, recent baseline 9-10 . On Eliquis.  Black tarry stools in ED.  History of peptic ulcer disease, duodenal ulcer. -Repeat CBC q6h x 3 - Transfuse for hemoglobin  < 7.5 -Hold Eliquis -IV Protonix bolus and drip -GI consult in a.m. - n.p.o.

## 2022-06-18 NOTE — ED Triage Notes (Signed)
Ems states that facility reports pt has not been eating. He did get his lantus today at the facility

## 2022-06-18 NOTE — Assessment & Plan Note (Signed)
Per nursing home staff, initial hyperglycemia 400  >>> hypoglycemia of 68.  In the setting of AKI on CKD.  Per nursing home staff he received Lantus today. - SSI- S q4h for now -Hold home Lantus

## 2022-06-18 NOTE — Assessment & Plan Note (Signed)
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 -DuoNebs as needed

## 2022-06-18 NOTE — ED Notes (Signed)
Family called and updated 

## 2022-06-18 NOTE — Assessment & Plan Note (Signed)
Rules out for sepsis.  UA suggestive of UTI with positive leukocytes.  No urine cultures on file. -IV ceftriaxone -Follow-up cultures

## 2022-06-19 ENCOUNTER — Encounter (HOSPITAL_COMMUNITY): Payer: Self-pay | Admitting: Internal Medicine

## 2022-06-19 ENCOUNTER — Other Ambulatory Visit: Payer: Self-pay

## 2022-06-19 DIAGNOSIS — K921 Melena: Secondary | ICD-10-CM | POA: Diagnosis not present

## 2022-06-19 DIAGNOSIS — N179 Acute kidney failure, unspecified: Secondary | ICD-10-CM | POA: Diagnosis not present

## 2022-06-19 DIAGNOSIS — I4892 Unspecified atrial flutter: Secondary | ICD-10-CM | POA: Diagnosis not present

## 2022-06-19 DIAGNOSIS — N184 Chronic kidney disease, stage 4 (severe): Secondary | ICD-10-CM | POA: Diagnosis not present

## 2022-06-19 DIAGNOSIS — G9341 Metabolic encephalopathy: Secondary | ICD-10-CM | POA: Diagnosis not present

## 2022-06-19 LAB — CBC
HCT: 26.9 % — ABNORMAL LOW (ref 39.0–52.0)
HCT: 27.8 % — ABNORMAL LOW (ref 39.0–52.0)
HCT: 28 % — ABNORMAL LOW (ref 39.0–52.0)
Hemoglobin: 7.9 g/dL — ABNORMAL LOW (ref 13.0–17.0)
Hemoglobin: 8.1 g/dL — ABNORMAL LOW (ref 13.0–17.0)
Hemoglobin: 8.1 g/dL — ABNORMAL LOW (ref 13.0–17.0)
MCH: 28.9 pg (ref 26.0–34.0)
MCH: 28.9 pg (ref 26.0–34.0)
MCH: 29 pg (ref 26.0–34.0)
MCHC: 28.9 g/dL — ABNORMAL LOW (ref 30.0–36.0)
MCHC: 29.1 g/dL — ABNORMAL LOW (ref 30.0–36.0)
MCHC: 29.4 g/dL — ABNORMAL LOW (ref 30.0–36.0)
MCV: 100 fL (ref 80.0–100.0)
MCV: 98.9 fL (ref 80.0–100.0)
MCV: 99.3 fL (ref 80.0–100.0)
Platelets: 262 10*3/uL (ref 150–400)
Platelets: 274 10*3/uL (ref 150–400)
Platelets: 276 10*3/uL (ref 150–400)
RBC: 2.72 MIL/uL — ABNORMAL LOW (ref 4.22–5.81)
RBC: 2.8 MIL/uL — ABNORMAL LOW (ref 4.22–5.81)
RBC: 2.8 MIL/uL — ABNORMAL LOW (ref 4.22–5.81)
RDW: 16.8 % — ABNORMAL HIGH (ref 11.5–15.5)
RDW: 16.9 % — ABNORMAL HIGH (ref 11.5–15.5)
RDW: 17.1 % — ABNORMAL HIGH (ref 11.5–15.5)
WBC: 8.7 10*3/uL (ref 4.0–10.5)
WBC: 9 10*3/uL (ref 4.0–10.5)
WBC: 9.7 10*3/uL (ref 4.0–10.5)
nRBC: 0 % (ref 0.0–0.2)
nRBC: 0 % (ref 0.0–0.2)
nRBC: 0 % (ref 0.0–0.2)

## 2022-06-19 LAB — GLUCOSE, CAPILLARY
Glucose-Capillary: 106 mg/dL — ABNORMAL HIGH (ref 70–99)
Glucose-Capillary: 114 mg/dL — ABNORMAL HIGH (ref 70–99)
Glucose-Capillary: 119 mg/dL — ABNORMAL HIGH (ref 70–99)
Glucose-Capillary: 74 mg/dL (ref 70–99)

## 2022-06-19 LAB — BASIC METABOLIC PANEL
Anion gap: 11 (ref 5–15)
BUN: 62 mg/dL — ABNORMAL HIGH (ref 8–23)
CO2: 23 mmol/L (ref 22–32)
Calcium: 8.1 mg/dL — ABNORMAL LOW (ref 8.9–10.3)
Chloride: 109 mmol/L (ref 98–111)
Creatinine, Ser: 4.91 mg/dL — ABNORMAL HIGH (ref 0.61–1.24)
GFR, Estimated: 12 mL/min — ABNORMAL LOW (ref >60)
Glucose, Bld: 96 mg/dL (ref 70–99)
Potassium: 3.9 mmol/L (ref 3.5–5.1)
Sodium: 143 mmol/L (ref 135–145)

## 2022-06-19 LAB — IRON AND TIBC
Iron: 31 ug/dL — ABNORMAL LOW (ref 45–182)
Saturation Ratios: 21 % (ref 17.9–39.5)
TIBC: 146 ug/dL — ABNORMAL LOW (ref 250–450)
UIBC: 115 ug/dL

## 2022-06-19 LAB — CBG MONITORING, ED
Glucose-Capillary: 434 mg/dL — ABNORMAL HIGH (ref 70–99)
Glucose-Capillary: 50 mg/dL — ABNORMAL LOW (ref 70–99)
Glucose-Capillary: 146 mg/dL — ABNORMAL HIGH (ref 70–99)

## 2022-06-19 LAB — FERRITIN: Ferritin: 149 ng/mL (ref 24–336)

## 2022-06-19 LAB — TYPE AND SCREEN
ABO/RH(D): O NEG
Antibody Screen: NEGATIVE

## 2022-06-19 MED ORDER — ACETAMINOPHEN 650 MG RE SUPP: 650.0000 mg | Freq: Four times a day (QID) | RECTAL | Status: DC | PRN

## 2022-06-19 MED ORDER — UMECLIDINIUM BROMIDE 62.5 MCG/ACT IN AEPB
1.0000 | INHALATION_SPRAY | Freq: Every day | RESPIRATORY_TRACT | Status: DC
  Administered 2022-06-20 – 2022-07-02 (×13): 1 via RESPIRATORY_TRACT
  Filled 2022-06-19 (×2): qty 7

## 2022-06-19 MED ORDER — DARBEPOETIN ALFA 300 MCG/0.6ML IJ SOSY
300.0000 ug | PREFILLED_SYRINGE | INTRAMUSCULAR | Status: DC
  Administered 2022-06-19 – 2022-06-26 (×2): 300 ug via SUBCUTANEOUS
  Filled 2022-06-19 (×5): qty 0.6

## 2022-06-19 MED ORDER — CALCITRIOL 0.25 MCG PO CAPS
0.2500 ug | ORAL_CAPSULE | Freq: Every day | ORAL | Status: DC
  Administered 2022-06-19 – 2022-07-02 (×13): 0.25 ug via ORAL
  Filled 2022-06-19 (×14): qty 1

## 2022-06-19 MED ORDER — IPRATROPIUM-ALBUTEROL 0.5-2.5 (3) MG/3ML IN SOLN
3.0000 mL | RESPIRATORY_TRACT | Status: DC | PRN
  Administered 2022-06-19 – 2022-06-25 (×10): 3 mL via RESPIRATORY_TRACT
  Filled 2022-06-19 (×10): qty 3

## 2022-06-19 MED ORDER — INSULIN ASPART 100 UNIT/ML IJ SOLN
0.0000 [IU] | INTRAMUSCULAR | Status: DC
  Administered 2022-06-19: 9 [IU] via SUBCUTANEOUS
  Administered 2022-06-21: 2 [IU] via SUBCUTANEOUS
  Administered 2022-06-21 (×4): 1 [IU] via SUBCUTANEOUS
  Administered 2022-06-22 (×2): 2 [IU] via SUBCUTANEOUS
  Administered 2022-06-23: 1 [IU] via SUBCUTANEOUS
  Administered 2022-06-23: 2 [IU] via SUBCUTANEOUS
  Administered 2022-06-23: 1 [IU] via SUBCUTANEOUS
  Administered 2022-06-23 – 2022-06-24 (×3): 2 [IU] via SUBCUTANEOUS
  Administered 2022-06-24: 1 [IU] via SUBCUTANEOUS
  Administered 2022-06-24: 2 [IU] via SUBCUTANEOUS
  Administered 2022-06-24 – 2022-06-27 (×7): 1 [IU] via SUBCUTANEOUS
  Administered 2022-06-28 (×2): 2 [IU] via SUBCUTANEOUS
  Administered 2022-06-28: 3 [IU] via SUBCUTANEOUS
  Administered 2022-06-29 – 2022-06-30 (×9): 1 [IU] via SUBCUTANEOUS
  Administered 2022-06-30: 2 [IU] via SUBCUTANEOUS
  Administered 2022-07-01 – 2022-07-02 (×7): 1 [IU] via SUBCUTANEOUS
  Filled 2022-06-19: qty 1

## 2022-06-19 MED ORDER — SODIUM CHLORIDE 0.9 % IV SOLN
2.0000 g | INTRAVENOUS | Status: DC
  Administered 2022-06-19: 2 g via INTRAVENOUS
  Filled 2022-06-19: qty 20

## 2022-06-19 MED ORDER — ACETAMINOPHEN 325 MG PO TABS
650.0000 mg | ORAL_TABLET | Freq: Four times a day (QID) | ORAL | Status: DC | PRN
  Administered 2022-06-19 – 2022-06-30 (×5): 650 mg via ORAL
  Filled 2022-06-19 (×5): qty 2

## 2022-06-19 MED ORDER — NYSTATIN NICU ORAL SYRINGE 100,000 UNITS/ML
0.5000 mL | Freq: Four times a day (QID) | OROMUCOSAL | Status: DC
  Filled 2022-06-19 (×3): qty 0.5

## 2022-06-19 MED ORDER — DEXTROSE 50 % IV SOLN
INTRAVENOUS | Status: AC
  Administered 2022-06-19: 50 mL
  Filled 2022-06-19: qty 50

## 2022-06-19 MED ORDER — POLYETHYLENE GLYCOL 3350 17 G PO PACK: 17.0000 g | PACK | Freq: Every day | ORAL | Status: DC | PRN

## 2022-06-19 MED ORDER — DEXTROSE-NACL 5-0.9 % IV SOLN: INTRAVENOUS | Status: DC

## 2022-06-19 MED ORDER — FLUOXETINE HCL 20 MG PO CAPS
40.0000 mg | ORAL_CAPSULE | Freq: Every day | ORAL | Status: DC
  Administered 2022-06-19 – 2022-07-02 (×13): 40 mg via ORAL
  Filled 2022-06-19 (×14): qty 2

## 2022-06-19 MED ORDER — AMIODARONE HCL 200 MG PO TABS
200.0000 mg | ORAL_TABLET | Freq: Every day | ORAL | Status: DC
  Administered 2022-06-19 – 2022-07-02 (×14): 200 mg via ORAL
  Filled 2022-06-19 (×14): qty 1

## 2022-06-19 MED ORDER — TORSEMIDE 20 MG PO TABS
60.0000 mg | ORAL_TABLET | Freq: Every day | ORAL | Status: DC
  Administered 2022-06-19 – 2022-06-22 (×3): 60 mg via ORAL
  Filled 2022-06-19 (×4): qty 3

## 2022-06-19 MED ORDER — FLUTICASONE FUROATE-VILANTEROL 100-25 MCG/ACT IN AEPB
1.0000 | INHALATION_SPRAY | Freq: Every day | RESPIRATORY_TRACT | Status: DC
  Administered 2022-06-20 – 2022-07-02 (×13): 1 via RESPIRATORY_TRACT
  Filled 2022-06-19: qty 28

## 2022-06-19 MED ORDER — METOPROLOL TARTRATE 25 MG PO TABS
25.0000 mg | ORAL_TABLET | Freq: Two times a day (BID) | ORAL | Status: DC
  Administered 2022-06-19 – 2022-06-27 (×17): 25 mg via ORAL
  Filled 2022-06-19 (×17): qty 1

## 2022-06-19 MED ORDER — CHLORHEXIDINE GLUCONATE CLOTH 2 % EX PADS
6.0000 | MEDICATED_PAD | Freq: Every day | CUTANEOUS | Status: DC
  Administered 2022-06-20 – 2022-06-21 (×3): 6 via TOPICAL

## 2022-06-19 MED ORDER — NYSTATIN 100000 UNIT/ML MT SUSP
5.0000 mL | Freq: Four times a day (QID) | OROMUCOSAL | Status: DC
  Administered 2022-06-19 – 2022-07-02 (×43): 500000 [IU] via ORAL
  Filled 2022-06-19 (×42): qty 5

## 2022-06-19 MED ORDER — TIOTROPIUM BROMIDE MONOHYDRATE 18 MCG IN CAPS: 18.0000 ug | ORAL_CAPSULE | Freq: Every day | RESPIRATORY_TRACT | Status: DC

## 2022-06-19 NOTE — ED Notes (Signed)
Pt alert and oriented to person, president, and place

## 2022-06-19 NOTE — Progress Notes (Signed)
Update given to pt sister in law, Vaughan Basta. Number listed in the chart and requests phone calls for any updates and/or questions.

## 2022-06-19 NOTE — ED Notes (Signed)
Patients CBG is 50. Nurse notified

## 2022-06-19 NOTE — Progress Notes (Signed)
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  The patient was recently hospitalized from 04/12/2022 to 04/20/2022 secondary to acute on chronic respiratory failure due to COPD exacerbation, pneumonia and acute on chronic HFpEF.  During that hospitalization, the patient also had acute on chronic renal failure.  He was felt to be hypervolemic and started on torsemide.   Per triage notes and ED provider who talked to staff at facility, at about 5 PM staff noticed that patient was confused.  Reported his blood sugars ranging in the 400s subsequently dropped to 68>> 130.  It is unknown if insulin was given when his blood sugars were in the 400s, a different staff had taken over patient's care.  Glucagon was given.  He has also had chronic left lower extremity pain which is unchanged. He also got his Lantus on the day of admission. In the ED, the patient was afebrile hemodynamically stable with oxygen saturation 92-95% on 4 L.  WBC 8.7, hemoglobin 8.1, platelets 274,000.  Sodium 143, potassium 3.9, bicarbonate 23, serum creatinine 5.02.   0.4 mg IV Narcan given, with some initial improvement in mental status, subsequent 0.8 mg Narcan given without significant response.  Patient was admitted for altered mental status and acute on chronic renal failure.   Assessment/Plan: Acute on chronic metabolic encephalopathy -Multifactorial including hypoglycemia, UTI, AKI, polypharmacy in the setting of impaired renal function -06/18/2022 ABG 7.36/47/74/26 on 2  L -06/19/2022--mental status slightly improved but not at baseline -Further workup if no improvement -CT brain negative -UA>50 WBC -Continue IV antibiotics -Holding gabapentin, hydrocodone  UTI -UA>50 WBC -Continue ceftriaxone pending culture data  Acute on chronic renal failure--CKD stage IV -Baseline creatinine 3.3-3.7 -Presented with serum creatinine 5.02 -Nephrology consulted -Patient appears clinically hypervolemic  Melena/FOBT positive -GI consulted -holding apixaban temporarily -clear liquids for now -had melena in ED -start pantoprazole drip  Paroxysmal atrial flutter -In and out of A-fib/flutter -Rate controlled with amiodarone/metoprolol -Eliquis on hold temporarily -Continue telemetry monitoring  Polymicrobial left ankle osteomyelitis- -s/p debridement/hardware removal by Dr. Sharol Given on 10/25  -finished 6 weeks abx on 05/09/22 -finished linezolid 11/25-12/6 -wound care consult  Chronic respiratory failure with hypoxia and hypercarbia/COPD -on 4L at baseline -continue BDs  Controlled DM2  -03/28/22 A1C--5.8 -hypoglycemic in am 1/16 -d/c semglee for now -novolog sliding scale  Gout Not in flare Continue colchicine   Chronic peripheral neuropathy/chronic back pain Continue as needed narcotics -hold gabapentin temporarily   Mood disorder Stable-continue Prozac -holding alprazolam temporarily   1.6 cm left thyroid nodule Incidental finding on CT chest Further work-up deferred to outpatient setting.   7 mm left lower lobe nodule Incidental finding on CT chest. Repeat CT chest 6-12 months.   4.3 cm ascending thoracic aneurysm Incidental finding on CT chest Annual imaging by CTA/MRA recommended.   Morbid Obesity: Last BMI 37.64 Life style modification  Left leg wound Wound care consult--see pics       Family Communication:  no Family at bedside  Consultants:  renal  Code Status:  FULL   DVT Prophylaxis:  [place]  Heparin  Procedures: As Listed in Progress Note Above  Antibiotics: Ceftriaxone 1/15>>     Subjective: Patient denies fevers, chills, headache, chest pain, dyspnea, nausea, vomiting, diarrhea, abdominal pain, dysuria, hematuria, hematochezia, and melena.   Objective: Vitals:   06/19/22 0300 06/19/22 0530 06/19/22 0600 06/19/22 0630  BP: 131/66 (!) 144/70 (!) 149/63 122/67  Pulse: 64   70  Resp: '10 11 16 11  '$ Temp:    97.8 F (36.6 C)  TempSrc:    Oral  SpO2: 100%   97%    Intake/Output Summary (Last 24 hours) at 06/19/2022 0910 Last data filed at 06/19/2022 9326 Gross per 24 hour  Intake 89.67 ml  Output --  Net 89.67 ml   Weight change:  Exam:  General:  Pt is alert, follows commands appropriately, not in acute distress HEENT: No icterus, No thrush, No neck mass, [place]/AT Cardiovascular: RRR, S1/S2, no rubs, no gallops Respiratory: bibasilar rales Abdomen: Soft/+BS, non tender, non distended, no guarding Extremities: 2 + LE edema, No lymphangitis, No petechiae, No rashes, no synovitis   Data Reviewed: I have personally reviewed following labs and imaging studies Basic Metabolic Panel: Recent Labs  Lab 06/18/22 1917 06/18/22 2055 06/19/22 0555  NA 142  --  143  K 3.7  --  3.9  CL 110  --  109  CO2 23  --  23  GLUCOSE 92  --  96  BUN 65*  --  62*  CREATININE 5.02*  --  4.91*  CALCIUM 7.9*  --  8.1*  MG  --  2.3  --    Liver Function Tests: Recent Labs  Lab 06/18/22 1917  AST 9*  ALT 8  ALKPHOS 65  BILITOT 0.7  PROT 6.1*  ALBUMIN 2.4*   No results for input(s): "LIPASE", "AMYLASE" in the last 168 hours. Recent Labs  Lab 06/18/22 1917  AMMONIA <10   Coagulation Profile: No results for input(s): "INR", "PROTIME" in the last 168 hours. CBC: Recent Labs  Lab 06/18/22 1917 06/19/22 0010 06/19/22 0555  WBC 7.8 9.0 8.7  NEUTROABS 6.6  --   --   HGB 7.8* 7.9* 8.1*  HCT 26.8* 26.9* 27.8*  MCV 100.0 98.9 99.3  PLT 244 262 274   Cardiac  Enzymes: No results for input(s): "CKTOTAL", "CKMB", "CKMBINDEX", "TROPONINI" in the last 168 hours. BNP: Invalid input(s): "POCBNP" CBG: Recent Labs  Lab 06/18/22 1927 06/18/22 2105 06/18/22 2316 06/19/22 0538 06/19/22 0759  GLUCAP 167* 117* 386* 434* 50*   HbA1C: No results for input(s): "HGBA1C" in the last 72 hours. Urine analysis:    Component Value Date/Time   COLORURINE YELLOW 06/18/2022 2027   APPEARANCEUR CLOUDY (A) 06/18/2022 2027   LABSPEC 1.010 06/18/2022 2027   PHURINE 5.0 06/18/2022 2027   GLUCOSEU NEGATIVE 06/18/2022 2027   HGBUR LARGE (A) 06/18/2022 2027   BILIRUBINUR NEGATIVE 06/18/2022 2027   KETONESUR NEGATIVE 06/18/2022 2027   PROTEINUR 30 (A) 06/18/2022 2027   UROBILINOGEN 0.2 03/04/2015 1835   NITRITE NEGATIVE 06/18/2022 2027   LEUKOCYTESUR LARGE (A) 06/18/2022 2027   Sepsis Labs: '@LABRCNTIP'$ (procalcitonin:4,lacticidven:4) ) Recent Results (from the past 240 hour(s))  Resp panel by RT-PCR (RSV, Flu A&B, Covid) Anterior Nasal Swab     Status: None   Collection Time: 06/18/22  7:13 PM   Specimen: Anterior Nasal Swab  Result Value Ref Range Status   SARS Coronavirus 2 by RT PCR NEGATIVE NEGATIVE Final    Comment: (NOTE) SARS-CoV-2 target nucleic acids are NOT DETECTED.  The SARS-CoV-2  RNA is generally detectable in upper respiratory specimens during the acute phase of infection. The lowest concentration of SARS-CoV-2 viral copies this assay can detect is 138 copies/mL. A negative result does not preclude SARS-Cov-2 infection and should not be used as the sole basis for treatment or other patient management decisions. A negative result may occur with  improper specimen collection/handling, submission of specimen other than nasopharyngeal swab, presence of viral mutation(s) within the areas targeted by this assay, and inadequate number of viral copies(<138 copies/mL). A negative result must be combined with clinical observations, patient history,  and epidemiological information. The expected result is Negative.  Fact Sheet for Patients:  EntrepreneurPulse.com.au  Fact Sheet for Healthcare Providers:  IncredibleEmployment.be  This test is no t yet approved or cleared by the [place] FDA and  has been authorized for detection and/or diagnosis of SARS-CoV-2 by FDA under an Emergency Use Authorization (EUA). This EUA will remain  in effect (meaning this test can be used) for the duration of the COVID-19 declaration under Section 564(b)(1) of the Act, 21 U.S.C.section 360bbb-3(b)(1), unless the authorization is terminated  or revoked sooner.       Influenza A by PCR NEGATIVE NEGATIVE Final   Influenza B by PCR NEGATIVE NEGATIVE Final    Comment: (NOTE) The Xpert Xpress SARS-CoV-2/FLU/RSV plus assay is intended as an aid in the diagnosis of influenza from Nasopharyngeal swab specimens and should not be used as a sole basis for treatment. Nasal washings and aspirates are unacceptable for Xpert Xpress SARS-CoV-2/FLU/RSV testing.  Fact Sheet for Patients: EntrepreneurPulse.com.au  Fact Sheet for Healthcare Providers: IncredibleEmployment.be  This test is not yet approved or cleared by the [place] FDA and has been authorized for detection and/or diagnosis of SARS-CoV-2 by FDA under an Emergency Use Authorization (EUA). This EUA will remain in effect (meaning this test can be used) for the duration of the COVID-19 declaration under Section 564(b)(1) of the Act, 21 U.S.C. section 360bbb-3(b)(1), unless the authorization is terminated or revoked.     Resp Syncytial Virus by PCR NEGATIVE NEGATIVE Final    Comment: (NOTE) Fact Sheet for Patients: EntrepreneurPulse.com.au  Fact Sheet for Healthcare Providers: IncredibleEmployment.be  This test is not yet approved or cleared by the [place] FDA and has  been authorized for detection and/or diagnosis of SARS-CoV-2 by FDA under an Emergency Use Authorization (EUA). This EUA will remain in effect (meaning this test can be used) for the duration of the COVID-19 declaration under Section 564(b)(1) of the Act, 21 U.S.C. section 360bbb-3(b)(1), unless the authorization is terminated or revoked.  Performed at [facility], [address] [place], [place] [address]      Scheduled Meds:  fluticasone furoate-vilanterol  1 puff Inhalation Daily   insulin aspart  0-9 Units Subcutaneous Q4H   [START ON 06/22/2022] pantoprazole  40 mg Intravenous Q12H   umeclidinium bromide  1 puff Inhalation Daily   Continuous Infusions:  cefTRIAXone (ROCEPHIN)  IV     dextrose 5 % and 0.9% NaCl 50 mL/hr at 06/19/22 0822   pantoprazole Stopped (06/19/22 8280)    Procedures/Studies: DG Chest 1 View  Result Date: 06/18/2022 CLINICAL DATA:  Altered mental status. EXAM: CHEST  1 VIEW COMPARISON:  Chest radiograph 04/12/2022, CT chest 04/12/2022 FINDINGS: The heart is enlarged.  The upper mediastinal contours are normal. Previously seen opacity in the left upper lobe has essentially resolved, with mild residual patchy opacity which may reflect residual scarring. There is no new or worsening focal airspace  disease. There are diffusely increased interstitial markings possibly reflecting mild pulmonary interstitial edema. There is no significant pleural effusion. There is no pneumothorax. Note that the smaller nodular opacity seen on the prior chest CT are not seen on the current study. There is no acute osseous abnormality. IMPRESSION: 1. Cardiomegaly with possible mild pulmonary interstitial edema. 2. Essentially resolved airspace opacity in the left upper lobe with probable residual scarring. 3. Additional smaller nodules seen on the prior chest CT are not seen on the current study. Please refer to that study for follow-up recommendations. Electronically Signed   By: Valetta Mole M.D.   On: 06/18/2022 20:09   DG Tibia/Fibula Left  Result Date: 06/18/2022 CLINICAL DATA:  Left lower extremity erythema EXAM: LEFT TIBIA AND FIBULA - 2 VIEW COMPARISON:  03/22/2022 FINDINGS: Frontal and lateral views of the left tibia and fibula are obtained. There is a residual screw traversing the medial malleolus. K-wire within the medial malleolus and the plate and screw fixation across the distal fibula have been removed in the interim. New heterotopic ossification along the dorsal margin of the distal fibula. There are no acute displaced fractures. The bones are diffusely osteopenic. No evidence of bony destruction or periosteal reaction to suggest osteomyelitis. There is diffuse subcutaneous edema. IMPRESSION: 1. Diffuse subcutaneous edema consistent with cellulitis. 2. No radiographic evidence of osteomyelitis. 3. Diffuse osteopenia. 4. Revision of orthopedic hardware about the left ankle as above. Electronically Signed   By: Randa Ngo M.D.   On: 06/18/2022 20:06   CT Head Wo Contrast  Result Date: 06/18/2022 CLINICAL DATA:  Altered mental status EXAM: CT HEAD WITHOUT CONTRAST TECHNIQUE: Contiguous axial images were obtained from the base of the skull through the vertex without intravenous contrast. RADIATION DOSE REDUCTION: This exam was performed according to the departmental dose-optimization program which includes automated exposure control, adjustment of the mA and/or kV according to patient size and/or use of iterative reconstruction technique. COMPARISON:  05/17/2019 FINDINGS: Brain: No mass, hemorrhage or extra-axial collection. There is periventricular hypoattenuation compatible with chronic microvascular disease. 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 Generalized volume loss. Vascular: Atherosclerotic calcification of the internal carotid and vertebral arteries at skull base. Skull: Normal. Negative for fracture or focal lesion. Sinuses/Orbits: No acute finding.  Other: None. IMPRESSION: 1. No acute intracranial abnormality. 2. 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 Electronically Signed   By: Ulyses Jarred M.D.   On: 06/18/2022 19:50    Orson Eva, DO  [facility]  If 7PM-7AM, please contact night-coverage [email or web address] Password [email or web address] 06/19/2022, 9:10 AM   LOS: 1 day

## 2022-06-19 NOTE — Consult Note (Signed)
Gastroenterology Consult   Referring Provider: No ref. provider found Primary Care Physician:  [name], MD Primary Gastroenterologist:  formerly Dr. Oneida Alar  Patient ID: Eric Scott; [id number]; Aug 27, 1950   Admit date: 06/18/2022  LOS: 1 day   Date of Consultation: 06/19/2022  Reason for Consultation:  acute on chronic anemia, black stools, on Eliquis. H/O PUD/duodenal ulcer    History of Present Illness   Eric Scott is a 72 y.o. male with CKD stage IV, COPD on home oxygen, DM, DVT, HTN, PVD, CVA, Afib on Eliquis, ascending thoracic aneurysm, recent PNA, osteomyelitis of left ankle with recent admission s/p hardware removal and debridement presents from [facility] due to altered mental status decreased oral intake. Patient also complaining of left lower extremity pain.   In the ED: Head CT negative for acute findings. UA s/o UTI with positive leukocytes. Started on Rocephin. Ammonia <10. BUN 65, Cre 5.02 (up from baseline of 3.34).  White blood cell count 7800, hemoglobin 7.8 down from 8.5 two months ago, MCV 100, platelets 244,000. BNP 773. Stool black, heme positive. CXR: Cardiomegaly with possible mild pulmonary interstitial edema  Labs today: Hgb 8.1. Cre 4.91. BUN 62.   Patient is alert and oriented to person, place, time. He states that he requires assistance with using the restroom. He denies anyone mentioning that he was having black or bloody stools there. He denies heartburn, dysphagia, vomiting, abdominal pain, constipation, or diarrhea. In the ED, there was report that he had black stool that was heme positive.   EGD 03/2017: -mild duodenitis  EGD 12/2016: -ulcerative reflux esophagitis -extensive duodenal ulceration, likely site of bleeding -gastritis, on bx mild chronic inactive gastritis. No H.pylori.  Colonoscopy 2014: -diverticulosis -three polyps, two tubular adenomas   Prior to Admission medications   Medication Sig Start Date End Date Taking?  Authorizing Provider  acetaminophen (TYLENOL) 325 MG tablet Take 2 tablets (650 mg total) by mouth every 6 (six) hours as needed for mild pain, fever or headache (or Fever >/= 101). 04/10/22   Elgergawy, Silver Huguenin, MD  albuterol (PROVENTIL) (2.5 MG/3ML) 0.083% nebulizer solution Take 3 mLs (2.5 mg total) by nebulization 3 (three) times daily. 04/20/22   Ghimire, Henreitta Leber, MD  albuterol (PROVENTIL) (2.5 MG/3ML) 0.083% nebulizer solution Take 3 mLs (2.5 mg total) by nebulization every 6 (six) hours as needed for wheezing or shortness of breath. 04/20/22   Ghimire, Henreitta Leber, MD  ALPRAZolam Duanne Moron) 0.5 MG tablet Take 1 tablet (0.5 mg total) by mouth at bedtime. 04/20/22   Ghimire, Henreitta Leber, MD  Amino Acids-Protein Hydrolys (PRO-STAT AWC) LIQD Take 30 mLs by mouth daily.    [provider]  amiodarone (PACERONE) 200 MG tablet Continue amiodarone 200 mg BID or 8 days (16 more doses), then transition to amiodarone 200 mg daily. Patient taking differently: Continue amiodarone 200 mg BID for 8 days (16 more doses) until 04/28/2022, then transition to amiodarone 200 mg daily (start 04/28/2022). 04/10/22   Elgergawy, Silver Huguenin, MD  amLODipine (NORVASC) 10 MG tablet Take 1 tablet (10 mg total) by mouth daily. 05/21/19   Roxan Hockey, MD  apixaban (ELIQUIS) 5 MG TABS tablet Take 1 tablet (5 mg total) by mouth 2 (two) times daily. 04/10/22   Elgergawy, Silver Huguenin, MD  ascorbic acid (VITAMIN C) 500 MG tablet Take 500 mg by mouth 2 (two) times daily.    [provider]  aspirin 81 MG chewable tablet Chew 81 mg by mouth daily. Swallow  whole.    [provider]  BREO ELLIPTA 100-25 MCG/INH AEPB Inhale 1 puff into the lungs daily.  09/27/16   [provider]  calcitRIOL (ROCALTROL) 0.25 MCG capsule Take 0.25 mcg by mouth daily.    [provider]  calcium carbonate (TUMS - DOSED IN MG ELEMENTAL CALCIUM) 500 MG chewable tablet Chew 2 tablets by mouth 2 (two) times daily.     [provider]  cholecalciferol (VITAMIN D) 1000 units tablet Take 1,000 Units by mouth daily.    [provider]  coconut oil OIL Apply 1 Application topically every 6 (six) hours as needed (protection and prevention).    [provider]  colchicine 0.6 MG tablet Take 0.5 tablets (0.3 mg total) by mouth daily. 04/10/22   Elgergawy, Silver Huguenin, MD  cyanocobalamin 100 MCG tablet Take 1,000 mcg by mouth daily.    [provider]  ferrous sulfate 325 (65 FE) MG tablet Take 325 mg by mouth 2 (two) times daily.    [provider]  FLUoxetine (PROZAC) 40 MG capsule Take 40 mg by mouth daily.    [provider]  gabapentin (NEURONTIN) 300 MG capsule Take 1 capsule (300 mg total) by mouth at bedtime. 12/21/19   Barton Dubois, MD  guaiFENesin 200 MG tablet Take 200 mg by mouth 2 (two) times daily.    [provider]  HUMALOG KWIKPEN 100 UNIT/ML KwikPen Inject into the skin. Per sliding scale 04/20/22   [provider]  HYDROcodone-acetaminophen (NORCO) 7.5-325 MG tablet Take 1 tablet by mouth 4 (four) times daily. 04/20/22   Ghimire, Henreitta Leber, MD  insulin aspart (NOVOLOG) 100 UNIT/ML injection Inject 2-10 Units into the skin See admin instructions. Sliding scale if 200-250 = 2 units, 251-300 = 4 units, 301-350 = 6 units, 351-400 = 8 units, 401-500 units = 10 units before meals and at betime Patient not taking: Reported on 04/24/2022    [provider]  ipratropium-albuterol (DUONEB) 0.5-2.5 (3) MG/3ML SOLN Take 3 mLs by nebulization every 4 (four) hours as needed (shortness of breath / wheezing).    [provider]  LANTUS 100 UNIT/ML injection Inject 0.22 mLs (22 Units total) into the skin at bedtime. 04/10/22   Elgergawy, Silver Huguenin, MD  magnesium oxide (MAG-OX) 400 MG tablet Take 400 mg by mouth daily.    [provider]  metoprolol tartrate (LOPRESSOR) 25 MG tablet Take 1 tablet (25 mg total) by mouth 2 (two)  times daily. 04/10/22   Elgergawy, Silver Huguenin, MD  Nutritional Supplement LIQD Take 120 mLs by mouth daily. "House supplement"    [provider]  pantoprazole (PROTONIX) 40 MG tablet Take 1 tablet (40 mg total) by mouth daily before breakfast. 05/21/19   Emokpae, Courage, MD  polyethylene glycol (MIRALAX / GLYCOLAX) 17 g packet Take 17 g by mouth daily. For constipation    [provider]  senna (SENOKOT) 8.6 MG tablet Take 2 tablets by mouth 2 (two) times daily.    [provider]  senna-docusate (SENOKOT-S) 8.6-50 MG tablet Take 1 tablet by mouth at bedtime as needed for mild constipation. 04/20/22   Ghimire, Henreitta Leber, MD  sodium zirconium cyclosilicate (LOKELMA) 10 g PACK packet Take 10 g by mouth 3 (three) times daily. 04/20/22   Ghimire, Henreitta Leber, MD  tamsulosin (FLOMAX) 0.4 MG CAPS capsule Take 1 capsule (0.4 mg total) by mouth daily after supper. Patient taking differently: Take 0.4 mg by mouth daily. 11/18/19   Emokpae,  Courage, MD  tiotropium (SPIRIVA HANDIHALER) 18 MCG inhalation capsule Place 1 capsule (18 mcg total) into inhaler and inhale daily. 04/20/22 04/20/23  Ghimire, Henreitta Leber, MD  torsemide (DEMADEX) 20 MG tablet Take 4 tablets (80 mg total) by mouth daily. 04/20/22   Ghimire, Henreitta Leber, MD    Current Facility-Administered Medications  Medication Dose Route Frequency Provider Last Rate Last Admin   acetaminophen (TYLENOL) tablet 650 mg  650 mg Oral Q6H PRN Emokpae, Ejiroghene E, MD       Or   acetaminophen (TYLENOL) suppository 650 mg  650 mg Rectal Q6H PRN Emokpae, Ejiroghene E, MD       cefTRIAXone (ROCEPHIN) 2 g in sodium chloride 0.9 % 100 mL IVPB  2 g Intravenous Q24H Emokpae, Ejiroghene E, MD       fluticasone furoate-vilanterol (BREO ELLIPTA) 100-25 MCG/ACT 1 puff  1 puff Inhalation Daily Emokpae, Ejiroghene E, MD       insulin aspart (novoLOG) injection 0-9 Units  0-9 Units Subcutaneous Q4H Emokpae, Ejiroghene E, MD   9 Units at 06/19/22 0552    ipratropium-albuterol (DUONEB) 0.5-2.5 (3) MG/3ML nebulizer solution 3 mL  3 mL Nebulization Q4H PRN Emokpae, Ejiroghene E, MD       [START ON 06/22/2022] pantoprazole (PROTONIX) injection 40 mg  40 mg Intravenous Q12H Emokpae, Ejiroghene E, MD       pantoprozole (PROTONIX) 80 mg /NS 100 mL infusion  8 mg/hr Intravenous Continuous Emokpae, Ejiroghene E, MD 10 mL/hr at 06/18/22 2324 8 mg/hr at 06/18/22 2324   polyethylene glycol (MIRALAX / GLYCOLAX) packet 17 g  17 g Oral Daily PRN Emokpae, Ejiroghene E, MD       umeclidinium bromide (INCRUSE ELLIPTA) 62.5 MCG/ACT 1 puff  1 puff Inhalation Daily Emokpae, Ejiroghene E, MD       Current Outpatient Medications  Medication Sig Dispense Refill   acetaminophen (TYLENOL) 325 MG tablet Take 2 tablets (650 mg total) by mouth every 6 (six) hours as needed for mild pain, fever or headache (or Fever >/= 101). 12 tablet 0   albuterol (PROVENTIL) (2.5 MG/3ML) 0.083% nebulizer solution Take 3 mLs (2.5 mg total) by nebulization 3 (three) times daily. 75 mL 12   albuterol (PROVENTIL) (2.5 MG/3ML) 0.083% nebulizer solution Take 3 mLs (2.5 mg total) by nebulization every 6 (six) hours as needed for wheezing or shortness of breath. 75 mL 12   ALPRAZolam (XANAX) 0.5 MG tablet Take 1 tablet (0.5 mg total) by mouth at bedtime. 10 tablet 0   Amino Acids-Protein Hydrolys (PRO-STAT AWC) LIQD Take 30 mLs by mouth daily.     amiodarone (PACERONE) 200 MG tablet Continue amiodarone 200 mg BID or 8 days (16 more doses), then transition to amiodarone 200 mg daily. (Patient taking differently: Continue amiodarone 200 mg BID for 8 days (16 more doses) until 04/28/2022, then transition to amiodarone 200 mg daily (start 04/28/2022).)     amLODipine (NORVASC) 10 MG tablet Take 1 tablet (10 mg total) by mouth daily. 30 tablet 2   apixaban (ELIQUIS) 5 MG TABS tablet Take 1 tablet (5 mg total) by mouth 2 (two) times daily. 60 tablet    ascorbic acid (VITAMIN C) 500 MG tablet Take 500 mg  by mouth 2 (two) times daily.     aspirin 81 MG chewable tablet Chew 81 mg by mouth daily. Swallow whole.     BREO ELLIPTA 100-25 MCG/INH AEPB Inhale 1 puff into the lungs daily.      calcitRIOL (ROCALTROL) 0.25  MCG capsule Take 0.25 mcg by mouth daily.     calcium carbonate (TUMS - DOSED IN MG ELEMENTAL CALCIUM) 500 MG chewable tablet Chew 2 tablets by mouth 2 (two) times daily.     cholecalciferol (VITAMIN D) 1000 units tablet Take 1,000 Units by mouth daily.     coconut oil OIL Apply 1 Application topically every 6 (six) hours as needed (protection and prevention).     colchicine 0.6 MG tablet Take 0.5 tablets (0.3 mg total) by mouth daily.     cyanocobalamin 100 MCG tablet Take 1,000 mcg by mouth daily.     ferrous sulfate 325 (65 FE) MG tablet Take 325 mg by mouth 2 (two) times daily.     FLUoxetine (PROZAC) 40 MG capsule Take 40 mg by mouth daily.     gabapentin (NEURONTIN) 300 MG capsule Take 1 capsule (300 mg total) by mouth at bedtime. 30 capsule 1   guaiFENesin 200 MG tablet Take 200 mg by mouth 2 (two) times daily.     HUMALOG KWIKPEN 100 UNIT/ML KwikPen Inject into the skin. Per sliding scale     HYDROcodone-acetaminophen (NORCO) 7.5-325 MG tablet Take 1 tablet by mouth 4 (four) times daily. 15 tablet 0   insulin aspart (NOVOLOG) 100 UNIT/ML injection Inject 2-10 Units into the skin See admin instructions. Sliding scale if 200-250 = 2 units, 251-300 = 4 units, 301-350 = 6 units, 351-400 = 8 units, 401-500 units = 10 units before meals and at betime (Patient not taking: Reported on 04/24/2022)     ipratropium-albuterol (DUONEB) 0.5-2.5 (3) MG/3ML SOLN Take 3 mLs by nebulization every 4 (four) hours as needed (shortness of breath / wheezing).     LANTUS 100 UNIT/ML injection Inject 0.22 mLs (22 Units total) into the skin at bedtime. 10 mL 11   magnesium oxide (MAG-OX) 400 MG tablet Take 400 mg by mouth daily.     metoprolol tartrate (LOPRESSOR) 25 MG tablet Take 1 tablet (25 mg total) by  mouth 2 (two) times daily.     Nutritional Supplement LIQD Take 120 mLs by mouth daily. "House supplement"     pantoprazole (PROTONIX) 40 MG tablet Take 1 tablet (40 mg total) by mouth daily before breakfast. 30 tablet 0   polyethylene glycol (MIRALAX / GLYCOLAX) 17 g packet Take 17 g by mouth daily. For constipation     senna (SENOKOT) 8.6 MG tablet Take 2 tablets by mouth 2 (two) times daily.     senna-docusate (SENOKOT-S) 8.6-50 MG tablet Take 1 tablet by mouth at bedtime as needed for mild constipation.     sodium zirconium cyclosilicate (LOKELMA) 10 g PACK packet Take 10 g by mouth 3 (three) times daily.     tamsulosin (FLOMAX) 0.4 MG CAPS capsule Take 1 capsule (0.4 mg total) by mouth daily after supper. (Patient taking differently: Take 0.4 mg by mouth daily.) 30 capsule 2   tiotropium (SPIRIVA HANDIHALER) 18 MCG inhalation capsule Place 1 capsule (18 mcg total) into inhaler and inhale daily. 30 capsule 2   torsemide (DEMADEX) 20 MG tablet Take 4 tablets (80 mg total) by mouth daily.      Allergies as of 06/18/2022 - Review Complete 06/18/2022  Allergen Reaction Noted   Baclofen  03/27/2022   Blueberry flavor Other (See Comments) 05/29/2016   Cucumber extract Other (See Comments) 08/01/2012   Flexeril [cyclobenzaprine hcl] Other (See Comments) 07/20/2010   Kiwi extract Other (See Comments) 08/01/2012    Past Medical History:  Diagnosis Date  Anemia    Anxiety    Chronic pain    legs, back; MRI 05/2012 with mild thoracic degenerative changes no spinal stenosis    CKD (chronic kidney disease) 05/12/2019   Stage IV   COPD (chronic obstructive pulmonary disease) ([place])    COVID-19    Depression    Diabetic peripheral neuropathy ([name])    "chronic" (50/93/2671)   Diastolic CHF ([name])    Duodenal ulcer hemorrhage    DVT (deep venous thrombosis) ([name])    right upper arm   GERD (gastroesophageal reflux disease)    Hypercholesteremia    Hypertension    Iron deficiency anemia     Osteomyelitis of ankle ([name])    Peripheral edema    PVD (peripheral vascular disease) ([place])    Spinal stenosis    mild lumbar (MRI 05/2012)-L2-L3 to L4-L5 , mild lumbar foraminal stenosis    Stroke ([place]) 05/2012   Subacute, lacunar infarcts within the left basal ganglia and posterior limp of the left internal capsule/thalamus; "RUE; both feet weak" (05/27/2012)   Tremor    Type II diabetes mellitus ([place])     Past Surgical History:  Procedure Laterality Date   ANKLE SURGERY     APPLICATION OF WOUND VAC Left 03/28/2022   Procedure: APPLICATION OF WOUND VAC;  Surgeon: [name], MD;  Location: [facility];  Service: Orthopedics;  Laterality: Left;   ESOPHAGOGASTRODUODENOSCOPY (EGD) WITH PROPOFOL N/A 12/21/2016   [place]: Ulcerative reflux esophagitis, erosive gastropathy, extensive duodenal ulceration likely site of bleeding.  Pathology with mild gastritis, no H. pylori   ESOPHAGOGASTRODUODENOSCOPY (EGD) WITH PROPOFOL N/A 04/02/2017   Fields: ESOPAHGITIS/GASTRIC AND DUODENAL ULCERS HEALED. mild duodenitis   HARDWARE REMOVAL Left 03/28/2022   Procedure: LEFT ANKLE REMOVAL DEEP HARDWARE, REMOVE BONE AND APPLY TISSUE GRAFT;  Surgeon: [name], MD;  Location: [facility];  Service: Orthopedics;  Laterality: Left;   HERNIA REPAIR  01/05/2004   "belly button" (05/27/2012)   IR FLUORO GUIDE CV LINE RIGHT  04/03/2022   IR REMOVAL TUN CV CATH W/O FL  05/10/2022   IR US GUIDE VASC ACCESS RIGHT  04/03/2022    Family History  Problem Relation Age of Onset   Colon cancer Neg Hx     Social History   Socioeconomic History   Marital status: Legally Separated    Spouse name: Not on file   Number of children: Not on file   Years of education: Not on file   Highest education level: Not on file  Occupational History   Not on file  Tobacco Use   Smoking status: Every Day    Packs/day: 0.10    Years: 54.00    Total pack years: 5.40    Types: Cigarettes    Last attempt to quit: 12/12/2019    Years  since quitting: 2.5   Smokeless tobacco: Never   Tobacco comments:    A pack a week  Vaping Use   Vaping Use: Never used  Substance and Sexual Activity   Alcohol use: No    Comment: 05/27/2012 "drank gallons and gallons 20 yr ago or so; last drink  at least 10 yr ago"   Drug use: No   Sexual activity: Not Currently  Other Topics Concern   Not on file  Social History Narrative   Pt is living at [facility] for 6 years now    He is divorced. Has one kid.   Not working for about 7 years now. Used to do  plumbing work.   Social Determinants of Health   Financial Resource Strain: Not on file  Food Insecurity: No Food Insecurity (04/12/2022)   Hunger Vital Sign    Worried About Running Out of Food in the Last Year: Never true    Ran Out of Food in the Last Year: Never true  Transportation Needs: No Transportation Needs (04/12/2022)   PRAPARE - Hydrologist (Medical): No    Lack of Transportation (Non-Medical): No  Physical Activity: Not on file  Stress: Not on file  Social Connections: Not on file  Intimate Partner Violence: Not At Risk (04/12/2022)   Humiliation, Afraid, Rape, and Kick questionnaire    Fear of Current or Ex-Partner: No    Emotionally Abused: No    Physically Abused: No    Sexually Abused: No     Review of System:   General: Negative for  weight loss, fever, chills, fatigue. See hpi Eyes: Negative for vision changes.  ENT: Negative for hoarseness, difficulty swallowing , nasal congestion. CV: Negative for chest pain, angina, palpitations, dyspnea on exertion, peripheral edema.  Respiratory: Negative for dyspnea at rest, dyspnea on exertion, cough, sputum, wheezing.  GI: See history of present illness. GU:  Negative for dysuria, hematuria, urinary incontinence, urinary frequency, nocturnal urination.  MS: Negative for joint pain, low back pain. Complains of left lower extremity pain. Derm: Negative for rash or itching.   Neuro: Negative for weakness, abnormal sensation, seizure, frequent headaches, memory loss, confusion.  Psych: Negative for anxiety, depression, suicidal ideation, hallucinations.  Endo: Negative for unusual weight change.  Heme: Negative for bruising or bleeding. Allergy: Negative for rash or hives.      Physical Examination:   Vital signs in last 24 hours: Temp:  [95.7 F (35.4 C)-97.9 F (36.6 C)] 97.8 F (36.6 C) (01/16 0630) Pulse Rate:  [59-84] 70 (01/16 0630) Resp:  [10-22] 11 (01/16 0630) BP: (108-158)/(52-129) 122/67 (01/16 0630) SpO2:  [92 %-100 %] 97 % (01/16 0630)    General: chronically ill appearing male well-developed in no acute distress.  Head: Normocephalic, atraumatic.   Eyes: Conjunctiva pale, no icterus. Mouth: Oropharyngeal mucosa moist and pink , no lesions erythema or exudate. Neck: Supple without thyromegaly, masses, or lymphadenopathy.  Lungs: Clear to auscultation bilaterally.  Heart: Regular rate and rhythm, no murmurs rubs or gallops.  Abdomen: Bowel sounds are normal, nontender, nondistended, no hepatosplenomegaly or masses, no abdominal bruits or hernia , no rebound or guarding.   Rectal: not performed Extremities: chronic venous status changed bilateral lower extremities. LLE with wound/drainage.  Neuro: Alert and oriented x 4.  Skin: Warm and dry, no rash or jaundice.   Psych: Alert and cooperative, normal mood and affect.        Intake/Output from previous day: No intake/output data recorded. Intake/Output this shift: No intake/output data recorded.  Lab Results:   CBC Recent Labs    06/18/22 1917 06/19/22 0010 06/19/22 0555  WBC 7.8 9.0 8.7  HGB 7.8* 7.9* 8.1*  HCT 26.8* 26.9* 27.8*  MCV 100.0 98.9 99.3  PLT 244 262 274   BMET Recent Labs    06/18/22 1917 06/19/22 0555  NA 142 143  K 3.7 3.9  CL 110 109  CO2 23 23  GLUCOSE 92 96  BUN 65* 62*  CREATININE 5.02* 4.91*  CALCIUM 7.9* 8.1*   LFT Recent Labs     06/18/22 1917  BILITOT 0.7  ALKPHOS 65  AST 9*  ALT 8  PROT 6.1*  ALBUMIN 2.4*    Lipase No results for input(s): "LIPASE" in the last 72 hours.  PT/INR No results for input(s): "LABPROT", "INR" in the last 72 hours.   Hepatitis Panel No results for input(s): "HEPBSAG", "HCVAB", "HEPAIGM", "HEPBIGM" in the last 72 hours.   Imaging Studies:   DG Chest 1 View  Result Date: 06/18/2022 CLINICAL DATA:  Altered mental status. EXAM: CHEST  1 VIEW COMPARISON:  Chest radiograph 04/12/2022, CT chest 04/12/2022 FINDINGS: The heart is enlarged.  The upper mediastinal contours are normal. Previously seen opacity in the left upper lobe has essentially resolved, with mild residual patchy opacity which may reflect residual scarring. There is no new or worsening focal airspace disease. There are diffusely increased interstitial markings possibly reflecting mild pulmonary interstitial edema. There is no significant pleural effusion. There is no pneumothorax. Note that the smaller nodular opacity seen on the prior chest CT are not seen on the current study. There is no acute osseous abnormality. IMPRESSION: 1. Cardiomegaly with possible mild pulmonary interstitial edema. 2. Essentially resolved airspace opacity in the left upper lobe with probable residual scarring. 3. Additional smaller nodules seen on the prior chest CT are not seen on the current study. Please refer to that study for follow-up recommendations. Electronically Signed   By: Valetta Mole M.D.   On: 06/18/2022 20:09   DG Tibia/Fibula Left  Result Date: 06/18/2022 CLINICAL DATA:  Left lower extremity erythema EXAM: LEFT TIBIA AND FIBULA - 2 VIEW COMPARISON:  03/22/2022 FINDINGS: Frontal and lateral views of the left tibia and fibula are obtained. There is a residual screw traversing the medial malleolus. K-wire within the medial malleolus and the plate and screw fixation across the distal fibula have been removed in the interim. New  heterotopic ossification along the dorsal margin of the distal fibula. There are no acute displaced fractures. The bones are diffusely osteopenic. No evidence of bony destruction or periosteal reaction to suggest osteomyelitis. There is diffuse subcutaneous edema. IMPRESSION: 1. Diffuse subcutaneous edema consistent with cellulitis. 2. No radiographic evidence of osteomyelitis. 3. Diffuse osteopenia. 4. Revision of orthopedic hardware about the left ankle as above. Electronically Signed   By: Randa Ngo M.D.   On: 06/18/2022 20:06   CT Head Wo Contrast  Result Date: 06/18/2022 CLINICAL DATA:  Altered mental status EXAM: CT HEAD WITHOUT CONTRAST TECHNIQUE: Contiguous axial images were obtained from the base of the skull through the vertex without intravenous contrast. RADIATION DOSE REDUCTION: This exam was performed according to the departmental dose-optimization program which includes automated exposure control, adjustment of the mA and/or kV according to patient size and/or use of iterative reconstruction technique. COMPARISON:  05/17/2019 FINDINGS: Brain: No mass, hemorrhage or extra-axial collection. There is periventricular hypoattenuation compatible with chronic microvascular disease. 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 Generalized volume loss. Vascular: Atherosclerotic calcification of the internal carotid and vertebral arteries at skull base. Skull: Normal. Negative for fracture or focal lesion. Sinuses/Orbits: No acute finding. Other: None. IMPRESSION: 1. No acute intracranial abnormality. 2. 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 Electronically Signed   By: Ulyses Jarred M.D.   On: 06/18/2022 19:50  [4 week]  Assessment:   72 y/o male with CKD stage IV, COPD on home oxygen, DM, DVT, HTN, PVD, CVA, Afib on Eliquis, ascending thoracic aneurysm, recent PNA, osteomyelitis of left ankle with recent admission s/p hardware removal and  debridement, presenting with AMS, diminished oral intake. GI consulted  for acute on chronic anemia, melena.   Melena: slight decline in Hgb from 8.5 to 7.8. Stable overnight. Report of melena in the ED last night. Patient denies any bleeding prior to admission. Denies GI symptoms. History of duodenal ulcers in 2018. He is chronically anticoagulated there would be advised to evaluate upper GI tract this admission.  Plan:   Plans for EGD tomorrow to allow for Eliquis washout.  Clear liquids today. NPO after midnight.  PPI infusion for now.  He needs to remain on PPI as outpatient.    LOS: 1 day   We would like to thank you for the opportunity to participate in the care of Eric Scott.  Laureen Ochs. Bernarda Caffey [facility] [phone] 1/16/20248:07 AM

## 2022-06-19 NOTE — Consult Note (Signed)
[facility] Renal Consultation Note  Requesting MD: Tat Indication for Consultation: A on CRF  HPI:  Eric Scott is a 72 y.o. male with past medical history significant for DM, COPD with a 4 liter O2 req, HTN, diastolic CHF, history of CVA's ( SNF resident) as well as stage 4 CKD followed by Dr. Theador Hawthorne-  last seen in December of 23-  crt in the 3's of late-  renal dysfunction dating back to at least 2018-  crt has been in the high 4's prior.  He was brought to the ER at [facility] on 1/15 from his SNF with AMS- possible UTI ( cloudy urine >50 WBC) as well as left lower leg x-ray suggestive of edema/cellulitis-  he was admitted and started on abx ( rocephin)-  starting crt was 5.0 ( had been 3.3 on 04/20/22) -  is down to 4.9 this AM-  I's and O's not recorded.  Hgb was 7.8 -  on [facility]-  GI has been consulted .  When I see him , he is alert ( different from descriptions)  has a tremor-  not entirely clear on  events-  does c/o dysuria and left leg pain when I ask  Creatinine, Ser  Date/Time Value Ref Range Status  06/19/2022 05:55 AM 4.91 (H) 0.61 - 1.24 mg/dL Final  06/18/2022 07:17 PM 5.02 (H) 0.61 - 1.24 mg/dL Final  04/20/2022 03:50 AM 3.34 (H) 0.61 - 1.24 mg/dL Final  04/19/2022 02:58 AM 3.36 (H) 0.61 - 1.24 mg/dL Final  04/18/2022 02:33 AM 3.36 (H) 0.61 - 1.24 mg/dL Final  04/17/2022 04:50 AM 3.34 (H) 0.61 - 1.24 mg/dL Final  04/16/2022 03:39 AM 3.45 (H) 0.61 - 1.24 mg/dL Final  04/15/2022 04:17 AM 3.36 (H) 0.61 - 1.24 mg/dL Final  04/14/2022 01:29 AM 3.70 (H) 0.61 - 1.24 mg/dL Final  04/13/2022 02:33 AM 3.33 (H) 0.61 - 1.24 mg/dL Final  04/12/2022 05:31 AM 3.19 (H) 0.61 - 1.24 mg/dL Final  04/11/2022 05:45 AM 3.12 (H) 0.61 - 1.24 mg/dL Final  04/10/2022 04:45 AM 2.88 (H) 0.61 - 1.24 mg/dL Final  04/09/2022 01:55 AM 3.03 (H) 0.61 - 1.24 mg/dL Final  04/08/2022 04:18 AM 2.99 (H) 0.61 - 1.24 mg/dL Final  04/07/2022 05:47 AM 3.01 (H) 0.61 - 1.24 mg/dL Final  04/06/2022  06:36 AM 3.02 (H) 0.61 - 1.24 mg/dL Final  04/05/2022 03:41 AM 3.14 (H) 0.61 - 1.24 mg/dL Final  04/04/2022 04:35 AM 3.29 (H) 0.61 - 1.24 mg/dL Final  04/02/2022 12:18 AM 3.40 (H) 0.61 - 1.24 mg/dL Final  03/31/2022 05:45 AM 3.61 (H) 0.61 - 1.24 mg/dL Final  03/28/2022 08:03 AM 3.78 (H) 0.61 - 1.24 mg/dL Final  01/31/2021 01:11 PM 3.86 (H) 0.61 - 1.24 mg/dL Final  12/20/2020 01:22 PM 4.44 (H) 0.61 - 1.24 mg/dL Final  11/23/2020 01:29 PM 4.78 (H) 0.61 - 1.24 mg/dL Final  10/12/2020 02:30 PM 4.88 (H) 0.61 - 1.24 mg/dL Final  09/14/2020 01:51 PM 4.09 (H) 0.61 - 1.24 mg/dL Final  05/14/2020 03:36 AM 2.89 (H) 0.61 - 1.24 mg/dL Final  05/13/2020 05:34 AM 3.37 (H) 0.61 - 1.24 mg/dL Final  05/12/2020 02:52 AM 3.90 (H) 0.61 - 1.24 mg/dL Final  05/11/2020 04:21 PM 3.73 (H) 0.61 - 1.24 mg/dL Final  05/10/2020 06:55 PM 3.61 (H) 0.61 - 1.24 mg/dL Final  12/21/2019 06:33 AM 4.20 (H) 0.61 - 1.24 mg/dL Final  12/20/2019 04:59 AM 4.30 (H) 0.61 - 1.24 mg/dL Final  12/19/2019 04:59 AM 4.03 (H)  0.61 - 1.24 mg/dL Final  12/18/2019 12:00 PM 3.96 (H) 0.61 - 1.24 mg/dL Final  11/18/2019 05:07 AM 3.73 (H) 0.61 - 1.24 mg/dL Final  11/17/2019 03:43 AM 3.68 (H) 0.61 - 1.24 mg/dL Final  11/16/2019 02:15 AM 3.62 (H) 0.61 - 1.24 mg/dL Final  05/22/2019 04:39 AM 3.64 (H) 0.61 - 1.24 mg/dL Final  05/21/2019 04:48 AM 3.31 (H) 0.61 - 1.24 mg/dL Final  05/20/2019 07:38 AM 3.15 (H) 0.61 - 1.24 mg/dL Final  05/20/2019 03:23 AM 3.08 (H) 0.61 - 1.24 mg/dL Final  05/19/2019 05:29 AM 3.17 (H) 0.61 - 1.24 mg/dL Final  05/18/2019 04:17 AM 2.98 (H) 0.61 - 1.24 mg/dL Final  05/17/2019 12:16 AM 3.30 (H) 0.61 - 1.24 mg/dL Final  04/22/2019 05:13 AM 3.10 (H) 0.61 - 1.24 mg/dL Final  04/21/2019 12:28 PM 3.08 (H) 0.61 - 1.24 mg/dL Final  04/21/2019 05:22 AM 3.02 (H) 0.61 - 1.24 mg/dL Final  04/20/2019 08:53 AM 2.84 (H) 0.61 - 1.24 mg/dL Final  03/28/2017 09:53 AM 1.41 (H) 0.61 - 1.24 mg/dL Final  12/24/2016 09:49 AM 4.75 (H) 0.61 -  1.24 mg/dL Final     PMHx:   Past Medical History:  Diagnosis Date   Anemia    Anxiety    Chronic pain    legs, back; MRI 05/2012 with mild thoracic degenerative changes no spinal stenosis    CKD (chronic kidney disease) 05/12/2019   Stage IV   COPD (chronic obstructive pulmonary disease) ([place])    COVID-19    Depression    Diabetic peripheral neuropathy ([name])    "chronic" (13/24/4010)   Diastolic CHF ([name])    Duodenal ulcer hemorrhage    DVT (deep venous thrombosis) ([name])    right upper arm   GERD (gastroesophageal reflux disease)    Hypercholesteremia    Hypertension    Iron deficiency anemia    Osteomyelitis of ankle ([name])    Peripheral edema    PVD (peripheral vascular disease) ([place])    Spinal stenosis    mild lumbar (MRI 05/2012)-L2-L3 to L4-L5 , mild lumbar foraminal stenosis    Stroke ([place]) 05/2012   Subacute, lacunar infarcts within the left basal ganglia and posterior limp of the left internal capsule/thalamus; "RUE; both feet weak" (05/27/2012)   Tremor    Type II diabetes mellitus ([place])     Past Surgical History:  Procedure Laterality Date   ANKLE SURGERY     APPLICATION OF WOUND VAC Left 03/28/2022   Procedure: APPLICATION OF WOUND VAC;  Surgeon: [name], MD;  Location: [facility];  Service: Orthopedics;  Laterality: Left;   ESOPHAGOGASTRODUODENOSCOPY (EGD) WITH PROPOFOL N/A 12/21/2016   [place]: Ulcerative reflux esophagitis, erosive gastropathy, extensive duodenal ulceration likely site of bleeding.  Pathology with mild gastritis, no H. pylori   ESOPHAGOGASTRODUODENOSCOPY (EGD) WITH PROPOFOL N/A 04/02/2017   Fields: ESOPAHGITIS/GASTRIC AND DUODENAL ULCERS HEALED. mild duodenitis   HARDWARE REMOVAL Left 03/28/2022   Procedure: LEFT ANKLE REMOVAL DEEP HARDWARE, REMOVE BONE AND APPLY TISSUE GRAFT;  Surgeon: [name], MD;  Location: [facility];  Service: Orthopedics;  Laterality: Left;   HERNIA REPAIR  01/05/2004   "belly button" (05/27/2012)   IR FLUORO  GUIDE CV LINE RIGHT  04/03/2022   IR REMOVAL TUN CV CATH W/O FL  05/10/2022   IR US GUIDE VASC ACCESS RIGHT  04/03/2022    Family Hx:  Family History  Problem Relation Age of Onset   Colon cancer Neg Hx     Social History:  reports that he has been smoking cigarettes. He has a 5.40 pack-year smoking history. He has never used smokeless tobacco. He reports that he does not drink alcohol and does not use drugs.  Allergies:  Allergies  Allergen Reactions   Baclofen     unknown   Blueberry Flavor Other (See Comments)    Unknown   Cucumber Extract Other (See Comments)    "Fells like I'm having a heart attack"   Flexeril [Cyclobenzaprine Hcl] Other (See Comments)    "whole body  Tremors" (05/27/2012)   Kiwi Extract Other (See Comments)    "feels like I'm having a heart attack"    Medications: Prior to Admission medications   Medication Sig Start Date End Date Taking? Authorizing Provider  acetaminophen (TYLENOL) 325 MG tablet Take 2 tablets (650 mg total) by mouth every 6 (six) hours as needed for mild pain, fever or headache (or Fever >/= 101). 04/10/22   Elgergawy, Silver Huguenin, MD  albuterol (PROVENTIL) (2.5 MG/3ML) 0.083% nebulizer solution Take 3 mLs (2.5 mg total) by nebulization 3 (three) times daily. 04/20/22   Ghimire, Henreitta Leber, MD  albuterol (PROVENTIL) (2.5 MG/3ML) 0.083% nebulizer solution Take 3 mLs (2.5 mg total) by nebulization every 6 (six) hours as needed for wheezing or shortness of breath. 04/20/22   Ghimire, Henreitta Leber, MD  ALPRAZolam Duanne Moron) 0.5 MG tablet Take 1 tablet (0.5 mg total) by mouth at bedtime. 04/20/22   Ghimire, Henreitta Leber, MD  Amino Acids-Protein Hydrolys (PRO-STAT AWC) LIQD Take 30 mLs by mouth daily.    [provider]  amiodarone (PACERONE) 200 MG tablet Continue amiodarone 200 mg BID or 8 days (16 more doses), then transition to amiodarone 200 mg daily. Patient taking differently: Continue amiodarone 200 mg BID for 8 days (16 more doses) until  04/28/2022, then transition to amiodarone 200 mg daily (start 04/28/2022). 04/10/22   Elgergawy, Silver Huguenin, MD  amLODipine (NORVASC) 10 MG tablet Take 1 tablet (10 mg total) by mouth daily. 05/21/19   Roxan Hockey, MD  apixaban (ELIQUIS) 5 MG TABS tablet Take 1 tablet (5 mg total) by mouth 2 (two) times daily. 04/10/22   Elgergawy, Silver Huguenin, MD  ascorbic acid (VITAMIN C) 500 MG tablet Take 500 mg by mouth 2 (two) times daily.    [provider]  aspirin 81 MG chewable tablet Chew 81 mg by mouth daily. Swallow whole.    [provider]  BREO ELLIPTA 100-25 MCG/INH AEPB Inhale 1 puff into the lungs daily.  09/27/16   [provider]  calcitRIOL (ROCALTROL) 0.25 MCG capsule Take 0.25 mcg by mouth daily.    [provider]  calcium carbonate (TUMS - DOSED IN MG ELEMENTAL CALCIUM) 500 MG chewable tablet Chew 2 tablets by mouth 2 (two) times daily.    [provider]  cholecalciferol (VITAMIN D) 1000 units tablet Take 1,000 Units by mouth daily.    [provider]  coconut oil OIL Apply 1 Application topically every 6 (six) hours as needed (protection and prevention).    [provider]  colchicine 0.6 MG tablet Take 0.5 tablets (0.3 mg total) by mouth daily. 04/10/22   Elgergawy, Silver Huguenin, MD  cyanocobalamin 100 MCG tablet Take 1,000 mcg by mouth daily.    [provider]  ferrous sulfate 325 (65 FE) MG tablet Take 325 mg by mouth 2 (two) times daily.    [provider]  FLUoxetine (PROZAC) 40 MG capsule Take 40 mg by mouth daily.  [provider]  gabapentin (NEURONTIN) 300 MG capsule Take 1 capsule (300 mg total) by mouth at bedtime. 12/21/19   Barton Dubois, MD  guaiFENesin 200 MG tablet Take 200 mg by mouth 2 (two) times daily.    [provider]  HUMALOG KWIKPEN 100 UNIT/ML KwikPen Inject into the skin. Per sliding scale 04/20/22   [provider]  HYDROcodone-acetaminophen (NORCO) 7.5-325 MG  tablet Take 1 tablet by mouth 4 (four) times daily. 04/20/22   Ghimire, Henreitta Leber, MD  insulin aspart (NOVOLOG) 100 UNIT/ML injection Inject 2-10 Units into the skin See admin instructions. Sliding scale if 200-250 = 2 units, 251-300 = 4 units, 301-350 = 6 units, 351-400 = 8 units, 401-500 units = 10 units before meals and at betime Patient not taking: Reported on 04/24/2022    [provider]  ipratropium-albuterol (DUONEB) 0.5-2.5 (3) MG/3ML SOLN Take 3 mLs by nebulization every 4 (four) hours as needed (shortness of breath / wheezing).    [provider]  LANTUS 100 UNIT/ML injection Inject 0.22 mLs (22 Units total) into the skin at bedtime. 04/10/22   Elgergawy, Silver Huguenin, MD  magnesium oxide (MAG-OX) 400 MG tablet Take 400 mg by mouth daily.    [provider]  metoprolol tartrate (LOPRESSOR) 25 MG tablet Take 1 tablet (25 mg total) by mouth 2 (two) times daily. 04/10/22   Elgergawy, Silver Huguenin, MD  Nutritional Supplement LIQD Take 120 mLs by mouth daily. "House supplement"    [provider]  pantoprazole (PROTONIX) 40 MG tablet Take 1 tablet (40 mg total) by mouth daily before breakfast. 05/21/19   Emokpae, Courage, MD  polyethylene glycol (MIRALAX / GLYCOLAX) 17 g packet Take 17 g by mouth daily. For constipation    [provider]  senna (SENOKOT) 8.6 MG tablet Take 2 tablets by mouth 2 (two) times daily.    [provider]  senna-docusate (SENOKOT-S) 8.6-50 MG tablet Take 1 tablet by mouth at bedtime as needed for mild constipation. 04/20/22   Ghimire, Henreitta Leber, MD  sodium zirconium cyclosilicate (LOKELMA) 10 g PACK packet Take 10 g by mouth 3 (three) times daily. 04/20/22   Ghimire, Henreitta Leber, MD  tamsulosin (FLOMAX) 0.4 MG CAPS capsule Take 1 capsule (0.4 mg total) by mouth daily after supper. Patient taking differently: Take 0.4 mg by mouth daily. 11/18/19   Roxan Hockey, MD  tiotropium (SPIRIVA HANDIHALER) 18 MCG inhalation capsule  Place 1 capsule (18 mcg total) into inhaler and inhale daily. 04/20/22 04/20/23  Ghimire, Henreitta Leber, MD  torsemide (DEMADEX) 20 MG tablet Take 4 tablets (80 mg total) by mouth daily. 04/20/22   Ghimire, Henreitta Leber, MD    I have reviewed the patient's current medications.  Labs:  Results for orders placed or performed during the hospital encounter of 06/18/22 (from the past 48 hour(s))  CBG monitoring, ED     Status: Abnormal   Collection Time: 06/18/22  6:36 PM  Result Value Ref Range   Glucose-Capillary 110 (H) 70 - 99 mg/dL    Comment: Glucose reference range applies only to samples taken after fasting for at least 8 hours.  Blood gas, arterial     Status: Abnormal   Collection Time: 06/18/22  6:57 PM  Result Value Ref Range   FIO2 28.0 %   pH, Arterial 7.36 7.35 - 7.45   pCO2 arterial 47 32 - 48 mmHg   pO2, Arterial 74 (L) 83 - 108 mmHg   Bicarbonate 26.5 20.0 -  28.0 mmol/L   Acid-Base Excess 0.7 0.0 - 2.0 mmol/L   O2 Saturation 96.1 %   Patient temperature 36.5    Collection site RIGHT RADIAL    Drawn by [id number]    Allens test (pass/fail) PASS PASS    Comment: Performed at [facility], [address] [place], [place] [address]  Resp panel by RT-PCR (RSV, Flu A&B, Covid) Anterior Nasal Swab     Status: None   Collection Time: 06/18/22  7:13 PM   Specimen: Anterior Nasal Swab  Result Value Ref Range   SARS Coronavirus 2 by RT PCR NEGATIVE NEGATIVE    Comment: (NOTE) SARS-CoV-2 target nucleic acids are NOT DETECTED.  The SARS-CoV-2 RNA is generally detectable in upper respiratory specimens during the acute phase of infection. The lowest concentration of SARS-CoV-2 viral copies this assay can detect is 138 copies/mL. A negative result does not preclude SARS-Cov-2 infection and should not be used as the sole basis for treatment or other patient management decisions. A negative result may occur with  improper specimen collection/handling, submission of specimen other than  nasopharyngeal swab, presence of viral mutation(s) within the areas targeted by this assay, and inadequate number of viral copies(<138 copies/mL). A negative result must be combined with clinical observations, patient history, and epidemiological information. The expected result is Negative.  Fact Sheet for Patients:  EntrepreneurPulse.com.au  Fact Sheet for Healthcare Providers:  IncredibleEmployment.be  This test is no t yet approved or cleared by the [place] FDA and  has been authorized for detection and/or diagnosis of SARS-CoV-2 by FDA under an Emergency Use Authorization (EUA). This EUA will remain  in effect (meaning this test can be used) for the duration of the COVID-19 declaration under Section 564(b)(1) of the Act, 21 U.S.C.section 360bbb-3(b)(1), unless the authorization is terminated  or revoked sooner.       Influenza A by PCR NEGATIVE NEGATIVE   Influenza B by PCR NEGATIVE NEGATIVE    Comment: (NOTE) The Xpert Xpress SARS-CoV-2/FLU/RSV plus assay is intended as an aid in the diagnosis of influenza from Nasopharyngeal swab specimens and should not be used as a sole basis for treatment. Nasal washings and aspirates are unacceptable for Xpert Xpress SARS-CoV-2/FLU/RSV testing.  Fact Sheet for Patients: EntrepreneurPulse.com.au  Fact Sheet for Healthcare Providers: IncredibleEmployment.be  This test is not yet approved or cleared by the [place] FDA and has been authorized for detection and/or diagnosis of SARS-CoV-2 by FDA under an Emergency Use Authorization (EUA). This EUA will remain in effect (meaning this test can be used) for the duration of the COVID-19 declaration under Section 564(b)(1) of the Act, 21 U.S.C. section 360bbb-3(b)(1), unless the authorization is terminated or revoked.     Resp Syncytial Virus by PCR NEGATIVE NEGATIVE    Comment: (NOTE) Fact Sheet for  Patients: EntrepreneurPulse.com.au  Fact Sheet for Healthcare Providers: IncredibleEmployment.be  This test is not yet approved or cleared by the [place] FDA and has been authorized for detection and/or diagnosis of SARS-CoV-2 by FDA under an Emergency Use Authorization (EUA). This EUA will remain in effect (meaning this test can be used) for the duration of the COVID-19 declaration under Section 564(b)(1) of the Act, 21 U.S.C. section 360bbb-3(b)(1), unless the authorization is terminated or revoked.  Performed at [facility], [address], [place], [place] [address]   Ammonia     Status: None   Collection Time: 06/18/22  7:17 PM  Result Value Ref Range   Ammonia <10 9 - 35  umol/L    Comment: Performed at [facility], [address] [place], [place] [address]  Comprehensive metabolic panel     Status: Abnormal   Collection Time: 06/18/22  7:17 PM  Result Value Ref Range   Sodium 142 135 - 145 mmol/L   Potassium 3.7 3.5 - 5.1 mmol/L   Chloride 110 98 - 111 mmol/L   CO2 23 22 - 32 mmol/L   Glucose, Bld 92 70 - 99 mg/dL    Comment: Glucose reference range applies only to samples taken after fasting for at least 8 hours.   BUN 65 (H) 8 - 23 mg/dL   Creatinine, Ser 5.02 (H) 0.61 - 1.24 mg/dL   Calcium 7.9 (L) 8.9 - 10.3 mg/dL   Total Protein 6.1 (L) 6.5 - 8.1 g/dL   Albumin 2.4 (L) 3.5 - 5.0 g/dL   AST 9 (L) 15 - 41 U/L   ALT 8 0 - 44 U/L   Alkaline Phosphatase 65 38 - 126 U/L   Total Bilirubin 0.7 0.3 - 1.2 mg/dL   GFR, Estimated 12 (L) >60 mL/min    Comment: (NOTE) Calculated using the CKD-EPI Creatinine Equation (2021)    Anion gap 9 5 - 15    Comment: Performed at [facility], [address], [place], [place] [address]  Ethanol     Status: None   Collection Time: 06/18/22  7:17 PM  Result Value Ref Range   Alcohol, Ethyl (B) <10 <10 mg/dL    Comment: (NOTE) Lowest detectable limit for serum alcohol is 10 mg/dL.  For  medical purposes only. Performed at [facility], [address] [place], [place] [address]   CBC with Differential     Status: Abnormal   Collection Time: 06/18/22  7:17 PM  Result Value Ref Range   WBC 7.8 4.0 - 10.5 K/uL   RBC 2.68 (L) 4.22 - 5.81 MIL/uL   Hemoglobin 7.8 (L) 13.0 - 17.0 g/dL   HCT 26.8 (L) 39.0 - 52.0 %   MCV 100.0 80.0 - 100.0 fL   MCH 29.1 26.0 - 34.0 pg   MCHC 29.1 (L) 30.0 - 36.0 g/dL   RDW 16.9 (H) 11.5 - 15.5 %   Platelets 244 150 - 400 K/uL   nRBC 0.0 0.0 - 0.2 %   Neutrophils Relative % 85 %   Neutro Abs 6.6 1.7 - 7.7 K/uL   Lymphocytes Relative 5 %   Lymphs Abs 0.4 (L) 0.7 - 4.0 K/uL   Monocytes Relative 6 %   Monocytes Absolute 0.4 0.1 - 1.0 K/uL   Eosinophils Relative 4 %   Eosinophils Absolute 0.3 0.0 - 0.5 K/uL   Basophils Relative 0 %   Basophils Absolute 0.0 0.0 - 0.1 K/uL   Immature Granulocytes 0 %   Abs Immature Granulocytes 0.03 0.00 - 0.07 K/uL    Comment: Performed at [facility], [address] [place], [place] [address]  Lactic acid, plasma     Status: None   Collection Time: 06/18/22  7:17 PM  Result Value Ref Range   Lactic Acid, Venous 0.7 0.5 - 1.9 mmol/L    Comment: Performed at [facility], [address], [place], [place] [address]  CBG monitoring, ED     Status: Abnormal   Collection Time: 06/18/22  7:27 PM  Result Value Ref Range   Glucose-Capillary 167 (H) 70 - 99 mg/dL    Comment: Glucose reference range applies only to samples taken after fasting for at least 8  hours.  Urinalysis, Routine w reflex microscopic     Status: Abnormal   Collection Time: 06/18/22  8:27 PM  Result Value Ref Range   Color, Urine YELLOW YELLOW   APPearance CLOUDY (A) CLEAR   Specific Gravity, Urine 1.010 1.005 - 1.030   pH 5.0 5.0 - 8.0   Glucose, UA NEGATIVE NEGATIVE mg/dL   Hgb urine dipstick LARGE (A) NEGATIVE   Bilirubin Urine NEGATIVE NEGATIVE   Ketones, ur NEGATIVE NEGATIVE mg/dL   Protein, ur 30 (A) NEGATIVE mg/dL   Nitrite  NEGATIVE NEGATIVE   Leukocytes,Ua LARGE (A) NEGATIVE   RBC / HPF >50 (H) 0 - 5 RBC/hpf   WBC, UA >50 (H) 0 - 5 WBC/hpf   Bacteria, UA RARE (A) NONE SEEN   Squamous Epithelial / HPF 0-5 0 - 5 /HPF   WBC Clumps PRESENT    Budding Yeast PRESENT    Hyaline Casts, UA PRESENT     Comment: Performed at [facility], [address], [place], [place] [address]  Urine rapid drug screen (hosp performed)     Status: Abnormal   Collection Time: 06/18/22  8:27 PM  Result Value Ref Range   Opiates POSITIVE (A) NONE DETECTED   Cocaine NONE DETECTED NONE DETECTED   Benzodiazepines POSITIVE (A) NONE DETECTED   Amphetamines NONE DETECTED NONE DETECTED   Tetrahydrocannabinol NONE DETECTED NONE DETECTED   Barbiturates NONE DETECTED NONE DETECTED    Comment: (NOTE) DRUG SCREEN FOR MEDICAL PURPOSES ONLY.  IF CONFIRMATION IS NEEDED FOR ANY PURPOSE, NOTIFY LAB WITHIN 5 DAYS.  LOWEST DETECTABLE LIMITS FOR URINE DRUG SCREEN Drug Class                     Cutoff (ng/mL) Amphetamine and metabolites    1000 Barbiturate and metabolites    200 Benzodiazepine                 200 Opiates and metabolites        300 Cocaine and metabolites        300 THC                            50 Performed at [facility], [address], [place], [place] [address]   POC occult blood, ED     Status: Abnormal   Collection Time: 06/18/22  8:42 PM  Result Value Ref Range   Fecal Occult Bld POSITIVE (A) NEGATIVE  Lactic acid, plasma     Status: None   Collection Time: 06/18/22  8:55 PM  Result Value Ref Range   Lactic Acid, Venous 1.0 0.5 - 1.9 mmol/L    Comment: Performed at [facility], [address], [place], [place] [address]  Type and screen [facility]     Status: None   Collection Time: 06/18/22  8:55 PM  Result Value Ref Range   ABO/RH(D) O NEG    Antibody Screen NEG    Sample Expiration      06/21/2022,2359 Performed at [facility], [address], [place], [place] [address]   Brain natriuretic  peptide     Status: Abnormal   Collection Time: 06/18/22  8:55 PM  Result Value Ref Range   B Natriuretic Peptide 773.0 (H) 0.0 - 100.0 pg/mL    Comment: Performed at [facility], [address], [place],  [address]  Magnesium     Status: None   Collection Time: 06/18/22  8:55  PM  Result Value Ref Range   Magnesium 2.3 1.7 - 2.4 mg/dL    Comment: Performed at [facility], [address], [place], [place] [address]  CBG monitoring, ED     Status: Abnormal   Collection Time: 06/18/22  9:05 PM  Result Value Ref Range   Glucose-Capillary 117 (H) 70 - 99 mg/dL    Comment: Glucose reference range applies only to samples taken after fasting for at least 8 hours.  CBG monitoring, ED     Status: Abnormal   Collection Time: 06/18/22 11:16 PM  Result Value Ref Range   Glucose-Capillary 386 (H) 70 - 99 mg/dL    Comment: Glucose reference range applies only to samples taken after fasting for at least 8 hours.  CBC     Status: Abnormal   Collection Time: 06/19/22 12:10 AM  Result Value Ref Range   WBC 9.0 4.0 - 10.5 K/uL   RBC 2.72 (L) 4.22 - 5.81 MIL/uL   Hemoglobin 7.9 (L) 13.0 - 17.0 g/dL   HCT 26.9 (L) 39.0 - 52.0 %   MCV 98.9 80.0 - 100.0 fL   MCH 29.0 26.0 - 34.0 pg   MCHC 29.4 (L) 30.0 - 36.0 g/dL   RDW 17.1 (H) 11.5 - 15.5 %   Platelets 262 150 - 400 K/uL   nRBC 0.0 0.0 - 0.2 %    Comment: Performed at [facility], [address] [place], [place] [address]  CBG monitoring, ED     Status: Abnormal   Collection Time: 06/19/22  5:38 AM  Result Value Ref Range   Glucose-Capillary 434 (H) 70 - 99 mg/dL    Comment: Glucose reference range applies only to samples taken after fasting for at least 8 hours.  CBC     Status: Abnormal   Collection Time: 06/19/22  5:55 AM  Result Value Ref Range   WBC 8.7 4.0 - 10.5 K/uL   RBC 2.80 (L) 4.22 - 5.81 MIL/uL   Hemoglobin 8.1 (L) 13.0 - 17.0 g/dL   HCT 27.8 (L) 39.0 - 52.0 %   MCV 99.3 80.0 - 100.0 fL   MCH 28.9 26.0 - 34.0 pg   MCHC  29.1 (L) 30.0 - 36.0 g/dL   RDW 16.8 (H) 11.5 - 15.5 %   Platelets 274 150 - 400 K/uL   nRBC 0.0 0.0 - 0.2 %    Comment: Performed at [facility], [address], [place], [place] [address]  Basic metabolic panel     Status: Abnormal   Collection Time: 06/19/22  5:55 AM  Result Value Ref Range   Sodium 143 135 - 145 mmol/L   Potassium 3.9 3.5 - 5.1 mmol/L   Chloride 109 98 - 111 mmol/L   CO2 23 22 - 32 mmol/L   Glucose, Bld 96 70 - 99 mg/dL    Comment: Glucose reference range applies only to samples taken after fasting for at least 8 hours.   BUN 62 (H) 8 - 23 mg/dL   Creatinine, Ser 4.91 (H) 0.61 - 1.24 mg/dL   Calcium 8.1 (L) 8.9 - 10.3 mg/dL   GFR, Estimated 12 (L) >60 mL/min    Comment: (NOTE) Calculated using the CKD-EPI Creatinine Equation (2021)    Anion gap 11 5 - 15    Comment: Performed at [facility], [address], [place],  [id number]  CBG monitoring, ED     Status: Abnormal   Collection Time: 06/19/22  7:59 AM  Result Value Ref  Range   Glucose-Capillary 50 (L) 70 - 99 mg/dL    Comment: Glucose reference range applies only to samples taken after fasting for at least 8 hours.     ROS:  A comprehensive review of systems was negative except for: Genitourinary: positive for dysuria Musculoskeletal: positive for left lower leg pain  Physical Exam: Vitals:   06/19/22 0830 06/19/22 0900  BP: (!) 125/57 (!) 146/70  Pulse:  88  Resp: 16 18  Temp:    SpO2:  97%     General: obese, pale, tremulous-  pleasant WM-  possibly slightly confused but alert HEENT: PERRLA, EOMI, mucous membranes moist  Neck: difficult to tell JVD Heart: RRR Lungs: poor air movement Abdomen:soft, non tender, obese Extremities: scaly skin-  woody edema-  left lower leg erythematous Skin:scaly Neuro: some confused but alert  Assessment/Plan: 72 year old chronically ill WM with advanced CKD at baseline-  now presents with UTI and cellulitis-  has some A on CRF 1.Renal- pt with  advanced CKD present for several years-  crt at best in the 3's-  he now has A on CRF in the setting of being hospitalized for UTI and cellulitis.  He has had episodes of A on CRF also in the past.  Fortunately there are no acute indications for HD and numbers have already moved a little in the right direction.  Pt would be a terrible candidate for dialysis due to comorbids and current SNF-  not great QOL but hopefully will not need to address that this hospitalization. 2. Hypertension/volume  - appears to be overloaded-  is reportedly on demedex 80 daily as OP-  got bolused upon arrival as is now needing IVF to keep sugar up-  will add back some demedex and follow 3. Potassium-  is on lokelma as OP-  no need here as of now 4. Anemia  - was anemic on his visits to Dr. Theador Hawthorne but had not been on ESA-  check iron stores and add ESA 5. Bones-  will continue OP calcitriol 0.25 daily    Judd Mccubbin A Monick Rena 06/19/2022, 10:12 AM

## 2022-06-19 NOTE — TOC Initial Note (Signed)
Transition of Care [facility]) - Initial/Assessment Note    Patient Details  Name: Eric Scott MRN: [id number] Date of Birth: [birth year]  Transition of Care [facility]) CM/SW Contact:    Ihor Gully, LCSW Phone Number: 06/19/2022, 2:29 PM  Clinical Narrative:                 Patient LTC at [facility]. Uses wc, self propels. Hands shake, requires assistance with ADLs. Can return at d/c. Message left for son.   Expected Discharge Plan: [place] Barriers to Discharge: Continued Medical Work up   Patient Goals and CMS Choice Patient states their goals for this hospitalization and ongoing recovery are:: patient is LTC at [facility]          Expected Discharge Plan and [place] arrangements for the past 2 months: [place]                                      Prior Living Arrangements/Services Living arrangements for the past 2 months: [place] Lives with:: Facility Resident                   Activities of Daily Living      Permission Sought/Granted Permission sought to share information with : Facility Sport and exercise psychologist    Share Information with NAME: Debbie at [facility]           Emotional Assessment              Admission diagnosis:  Somnolence [R40.0] Acute kidney injury superimposed on chronic kidney disease ([place]) [N17.9, W09.8] Acute metabolic encephalopathy [J19.14] Patient Active Problem List   Diagnosis Date Noted   Melena 78/29/5621   Acute metabolic encephalopathy 30/86/5784   UTI (urinary tract infection) 06/18/2022   Status post peripherally inserted central catheter (PICC) central line placement 04/24/2022   PAF (paroxysmal atrial fibrillation) ([place]) 04/06/2022   Mood disorder ([place]) 04/02/2022   Stage 4 chronic kidney disease ([place])    Osteomyelitis of ankle or foot, acute, left ([place]) 03/29/2022   Ulcer of left lower leg, with necrosis of bone ([place]) 03/28/2022   Abscess of left lower leg  69/62/9528   Hardware complicating wound infection ([place]) 03/28/2022   Leg wound, left, sequela 03/28/2022   Cholelithiasis 05/12/2020   Elevated liver enzymes    RUQ pain    Bacteremia 05/11/2020   COPD  GOLD ?  / AB  01/26/2020   Acute on chronic respiratory failure with hypoxia ([place]) 12/18/2019   Respiratory failure with hypoxia ([place]) 11/17/2019   Acute on chronic anemia 11/16/2019   Cellulitis of both lower extremities 11/16/2019   Leukocytosis 05/22/2019   Chronic heart failure with preserved ejection fraction (HFpEF) ([place]) 05/22/2019   Acute respiratory failure ([place]) 05/17/2019   Obesity, Class III, BMI 40-49.9 (morbid obesity) ([place]) 05/17/2019   Atrial flutter, paroxysmal ([place]) 05/17/2019   COPD with acute exacerbation ([place]) 04/20/2019   GERD (gastroesophageal reflux disease) 08/08/2018   PUD (peptic ulcer disease)    Gastric erosions 02/13/2017   Duodenal ulcer 02/13/2017   Transaminitis    UGI bleed    Arm DVT (deep venous thromboembolism), acute, right ([place]) 12/18/2016   Uremia 12/10/2016   Acute renal failure ([name])    Volume depletion 11/23/2016   Hypotension    Liver enzyme elevation    Idiopathic chronic venous hypertension of both  lower extremities with ulcer and inflammation ([place]) 11/15/2016   Arterial insufficiency of lower extremity ([place]) 11/15/2016   Pressure injury of skin 11/09/2016   Sepsis ([place]) 11/09/2016   Osteomyelitis ([place]) 11/09/2016   Wounds, multiple 11/08/2016   Chronic venous insufficiency 10/12/2016   Critical lower limb ischemia ([place]) 10/12/2016   Fatty liver 09/10/2016   History of colonic polyps 09/10/2016   Gallstone 09/10/2016   AKI (acute kidney injury) ([place]) 03/04/2015   Fall 03/04/2015   Generalized weakness 03/04/2015   Diabetic polyneuropathy associated with type 2 diabetes mellitus ([place])    Anxiety    PVD (peripheral vascular disease) ([place])    Pneumonia 07/20/2014   Anxiety state 04/18/2014   Acute on chronic diastolic CHF  (congestive heart failure) ([place]) 04/07/2014   Solitary pulmonary nodule 04/07/2014   COPD with exacerbation ([name]) 04/02/2014   Hypoxia 04/02/2014   Cerebral infarction ([place]) 06/01/2012   Chronic pain (back, legs) 05/30/2012   Toe laceration, 4th toe 05/30/2012   C. difficile diarrhea 05/30/2012   Hypokalemia 05/30/2012   Acute lacunar stroke ([place]) 05/27/2012   Type 2 diabetes mellitus with stage 4 chronic kidney disease, with Rickels-term current use of insulin ([place]) 05/27/2012   Smoker 05/27/2012   HTN (hypertension) 05/27/2012   PCP:  [name], MD Pharmacy:   [place], [place] - [address] [place] [address] Phone: [phone] Fax: [phone]  [place], [place] - [address] [address] Phone: [phone] Fax: [phone]  [place], [place] - [address] [place] [address] Phone: [phone] Fax: [phone]     Social Determinants of Health (SDOH) Social History: SDOH Screenings   Food Insecurity: No Food Insecurity (04/12/2022)  Housing: Low Risk  (04/12/2022)  Transportation Needs: No Transportation Needs (04/12/2022)  Utilities: Not At Risk (04/12/2022)  Depression (PHQ2-9): Low Risk  (04/24/2022)  Tobacco Use: High Risk (06/18/2022)   SDOH Interventions:     Readmission Risk Interventions     No data to display

## 2022-06-19 NOTE — ED Notes (Signed)
Pt sister in law, Vaughan Basta called for update. Vaughan Basta was advised that she is not on the HIPAA release and she can receive updates from family who is.

## 2022-06-19 NOTE — H&P (View-Only) (Signed)
Gastroenterology Consult   Referring Provider: No ref. provider found Primary Care Physician:  [name], MD Primary Gastroenterologist:  formerly Dr. Oneida Alar  Patient ID: EDSEL SHIVES; [id number]; January 10, 1951   Admit date: 06/18/2022  LOS: 1 day   Date of Consultation: 06/19/2022  Reason for Consultation:  acute on chronic anemia, black stools, on Eliquis. H/O PUD/duodenal ulcer    History of Present Illness   JEROMEY KRUER is a 72 y.o. male with CKD stage IV, COPD on home oxygen, DM, DVT, HTN, PVD, CVA, Afib on Eliquis, ascending thoracic aneurysm, recent PNA, osteomyelitis of left ankle with recent admission s/p hardware removal and debridement presents from [facility] due to altered mental status decreased oral intake. Patient also complaining of left lower extremity pain.   In the ED: Head CT negative for acute findings. UA s/o UTI with positive leukocytes. Started on Rocephin. Ammonia <10. BUN 65, Cre 5.02 (up from baseline of 3.34).  White blood cell count 7800, hemoglobin 7.8 down from 8.5 two months ago, MCV 100, platelets 244,000. BNP 773. Stool black, heme positive. CXR: Cardiomegaly with possible mild pulmonary interstitial edema  Labs today: Hgb 8.1. Cre 4.91. BUN 62.   Patient is alert and oriented to person, place, time. He states that he requires assistance with using the restroom. He denies anyone mentioning that he was having black or bloody stools there. He denies heartburn, dysphagia, vomiting, abdominal pain, constipation, or diarrhea. In the ED, there was report that he had black stool that was heme positive.   EGD 03/2017: -mild duodenitis  EGD 12/2016: -ulcerative reflux esophagitis -extensive duodenal ulceration, likely site of bleeding -gastritis, on bx mild chronic inactive gastritis. No H.pylori.  Colonoscopy 2014: -diverticulosis -three polyps, two tubular adenomas   Prior to Admission medications   Medication Sig Start Date End Date Taking?  Authorizing Provider  acetaminophen (TYLENOL) 325 MG tablet Take 2 tablets (650 mg total) by mouth every 6 (six) hours as needed for mild pain, fever or headache (or Fever >/= 101). 04/10/22   Elgergawy, Silver Huguenin, MD  albuterol (PROVENTIL) (2.5 MG/3ML) 0.083% nebulizer solution Take 3 mLs (2.5 mg total) by nebulization 3 (three) times daily. 04/20/22   Ghimire, Henreitta Leber, MD  albuterol (PROVENTIL) (2.5 MG/3ML) 0.083% nebulizer solution Take 3 mLs (2.5 mg total) by nebulization every 6 (six) hours as needed for wheezing or shortness of breath. 04/20/22   Ghimire, Henreitta Leber, MD  ALPRAZolam Duanne Moron) 0.5 MG tablet Take 1 tablet (0.5 mg total) by mouth at bedtime. 04/20/22   Ghimire, Henreitta Leber, MD  Amino Acids-Protein Hydrolys (PRO-STAT AWC) LIQD Take 30 mLs by mouth daily.    [provider]  amiodarone (PACERONE) 200 MG tablet Continue amiodarone 200 mg BID or 8 days (16 more doses), then transition to amiodarone 200 mg daily. Patient taking differently: Continue amiodarone 200 mg BID for 8 days (16 more doses) until 04/28/2022, then transition to amiodarone 200 mg daily (start 04/28/2022). 04/10/22   Elgergawy, Silver Huguenin, MD  amLODipine (NORVASC) 10 MG tablet Take 1 tablet (10 mg total) by mouth daily. 05/21/19   Roxan Hockey, MD  apixaban (ELIQUIS) 5 MG TABS tablet Take 1 tablet (5 mg total) by mouth 2 (two) times daily. 04/10/22   Elgergawy, Silver Huguenin, MD  ascorbic acid (VITAMIN C) 500 MG tablet Take 500 mg by mouth 2 (two) times daily.    [provider]  aspirin 81 MG chewable tablet Chew 81 mg by mouth daily. Swallow  whole.    [provider]  BREO ELLIPTA 100-25 MCG/INH AEPB Inhale 1 puff into the lungs daily.  09/27/16   [provider]  calcitRIOL (ROCALTROL) 0.25 MCG capsule Take 0.25 mcg by mouth daily.    [provider]  calcium carbonate (TUMS - DOSED IN MG ELEMENTAL CALCIUM) 500 MG chewable tablet Chew 2 tablets by mouth 2 (two) times daily.     [provider]  cholecalciferol (VITAMIN D) 1000 units tablet Take 1,000 Units by mouth daily.    [provider]  coconut oil OIL Apply 1 Application topically every 6 (six) hours as needed (protection and prevention).    [provider]  colchicine 0.6 MG tablet Take 0.5 tablets (0.3 mg total) by mouth daily. 04/10/22   Elgergawy, Silver Huguenin, MD  cyanocobalamin 100 MCG tablet Take 1,000 mcg by mouth daily.    [provider]  ferrous sulfate 325 (65 FE) MG tablet Take 325 mg by mouth 2 (two) times daily.    [provider]  FLUoxetine (PROZAC) 40 MG capsule Take 40 mg by mouth daily.    [provider]  gabapentin (NEURONTIN) 300 MG capsule Take 1 capsule (300 mg total) by mouth at bedtime. 12/21/19   Barton Dubois, MD  guaiFENesin 200 MG tablet Take 200 mg by mouth 2 (two) times daily.    [provider]  HUMALOG KWIKPEN 100 UNIT/ML KwikPen Inject into the skin. Per sliding scale 04/20/22   [provider]  HYDROcodone-acetaminophen (NORCO) 7.5-325 MG tablet Take 1 tablet by mouth 4 (four) times daily. 04/20/22   Ghimire, Henreitta Leber, MD  insulin aspart (NOVOLOG) 100 UNIT/ML injection Inject 2-10 Units into the skin See admin instructions. Sliding scale if 200-250 = 2 units, 251-300 = 4 units, 301-350 = 6 units, 351-400 = 8 units, 401-500 units = 10 units before meals and at betime Patient not taking: Reported on 04/24/2022    [provider]  ipratropium-albuterol (DUONEB) 0.5-2.5 (3) MG/3ML SOLN Take 3 mLs by nebulization every 4 (four) hours as needed (shortness of breath / wheezing).    [provider]  LANTUS 100 UNIT/ML injection Inject 0.22 mLs (22 Units total) into the skin at bedtime. 04/10/22   Elgergawy, Silver Huguenin, MD  magnesium oxide (MAG-OX) 400 MG tablet Take 400 mg by mouth daily.    [provider]  metoprolol tartrate (LOPRESSOR) 25 MG tablet Take 1 tablet (25 mg total) by mouth 2 (two)  times daily. 04/10/22   Elgergawy, Silver Huguenin, MD  Nutritional Supplement LIQD Take 120 mLs by mouth daily. "House supplement"    [provider]  pantoprazole (PROTONIX) 40 MG tablet Take 1 tablet (40 mg total) by mouth daily before breakfast. 05/21/19   Emokpae, Courage, MD  polyethylene glycol (MIRALAX / GLYCOLAX) 17 g packet Take 17 g by mouth daily. For constipation    [provider]  senna (SENOKOT) 8.6 MG tablet Take 2 tablets by mouth 2 (two) times daily.    [provider]  senna-docusate (SENOKOT-S) 8.6-50 MG tablet Take 1 tablet by mouth at bedtime as needed for mild constipation. 04/20/22   Ghimire, Henreitta Leber, MD  sodium zirconium cyclosilicate (LOKELMA) 10 g PACK packet Take 10 g by mouth 3 (three) times daily. 04/20/22   Ghimire, Henreitta Leber, MD  tamsulosin (FLOMAX) 0.4 MG CAPS capsule Take 1 capsule (0.4 mg total) by mouth daily after supper. Patient taking differently: Take 0.4 mg by mouth daily. 11/18/19   Emokpae,  Courage, MD  tiotropium (SPIRIVA HANDIHALER) 18 MCG inhalation capsule Place 1 capsule (18 mcg total) into inhaler and inhale daily. 04/20/22 04/20/23  Ghimire, Henreitta Leber, MD  torsemide (DEMADEX) 20 MG tablet Take 4 tablets (80 mg total) by mouth daily. 04/20/22   Ghimire, Henreitta Leber, MD    Current Facility-Administered Medications  Medication Dose Route Frequency Provider Last Rate Last Admin   acetaminophen (TYLENOL) tablet 650 mg  650 mg Oral Q6H PRN Emokpae, Ejiroghene E, MD       Or   acetaminophen (TYLENOL) suppository 650 mg  650 mg Rectal Q6H PRN Emokpae, Ejiroghene E, MD       cefTRIAXone (ROCEPHIN) 2 g in sodium chloride 0.9 % 100 mL IVPB  2 g Intravenous Q24H Emokpae, Ejiroghene E, MD       fluticasone furoate-vilanterol (BREO ELLIPTA) 100-25 MCG/ACT 1 puff  1 puff Inhalation Daily Emokpae, Ejiroghene E, MD       insulin aspart (novoLOG) injection 0-9 Units  0-9 Units Subcutaneous Q4H Emokpae, Ejiroghene E, MD   9 Units at 06/19/22 0552    ipratropium-albuterol (DUONEB) 0.5-2.5 (3) MG/3ML nebulizer solution 3 mL  3 mL Nebulization Q4H PRN Emokpae, Ejiroghene E, MD       [START ON 06/22/2022] pantoprazole (PROTONIX) injection 40 mg  40 mg Intravenous Q12H Emokpae, Ejiroghene E, MD       pantoprozole (PROTONIX) 80 mg /NS 100 mL infusion  8 mg/hr Intravenous Continuous Emokpae, Ejiroghene E, MD 10 mL/hr at 06/18/22 2324 8 mg/hr at 06/18/22 2324   polyethylene glycol (MIRALAX / GLYCOLAX) packet 17 g  17 g Oral Daily PRN Emokpae, Ejiroghene E, MD       umeclidinium bromide (INCRUSE ELLIPTA) 62.5 MCG/ACT 1 puff  1 puff Inhalation Daily Emokpae, Ejiroghene E, MD       Current Outpatient Medications  Medication Sig Dispense Refill   acetaminophen (TYLENOL) 325 MG tablet Take 2 tablets (650 mg total) by mouth every 6 (six) hours as needed for mild pain, fever or headache (or Fever >/= 101). 12 tablet 0   albuterol (PROVENTIL) (2.5 MG/3ML) 0.083% nebulizer solution Take 3 mLs (2.5 mg total) by nebulization 3 (three) times daily. 75 mL 12   albuterol (PROVENTIL) (2.5 MG/3ML) 0.083% nebulizer solution Take 3 mLs (2.5 mg total) by nebulization every 6 (six) hours as needed for wheezing or shortness of breath. 75 mL 12   ALPRAZolam (XANAX) 0.5 MG tablet Take 1 tablet (0.5 mg total) by mouth at bedtime. 10 tablet 0   Amino Acids-Protein Hydrolys (PRO-STAT AWC) LIQD Take 30 mLs by mouth daily.     amiodarone (PACERONE) 200 MG tablet Continue amiodarone 200 mg BID or 8 days (16 more doses), then transition to amiodarone 200 mg daily. (Patient taking differently: Continue amiodarone 200 mg BID for 8 days (16 more doses) until 04/28/2022, then transition to amiodarone 200 mg daily (start 04/28/2022).)     amLODipine (NORVASC) 10 MG tablet Take 1 tablet (10 mg total) by mouth daily. 30 tablet 2   apixaban (ELIQUIS) 5 MG TABS tablet Take 1 tablet (5 mg total) by mouth 2 (two) times daily. 60 tablet    ascorbic acid (VITAMIN C) 500 MG tablet Take 500 mg  by mouth 2 (two) times daily.     aspirin 81 MG chewable tablet Chew 81 mg by mouth daily. Swallow whole.     BREO ELLIPTA 100-25 MCG/INH AEPB Inhale 1 puff into the lungs daily.      calcitRIOL (ROCALTROL) 0.25  MCG capsule Take 0.25 mcg by mouth daily.     calcium carbonate (TUMS - DOSED IN MG ELEMENTAL CALCIUM) 500 MG chewable tablet Chew 2 tablets by mouth 2 (two) times daily.     cholecalciferol (VITAMIN D) 1000 units tablet Take 1,000 Units by mouth daily.     coconut oil OIL Apply 1 Application topically every 6 (six) hours as needed (protection and prevention).     colchicine 0.6 MG tablet Take 0.5 tablets (0.3 mg total) by mouth daily.     cyanocobalamin 100 MCG tablet Take 1,000 mcg by mouth daily.     ferrous sulfate 325 (65 FE) MG tablet Take 325 mg by mouth 2 (two) times daily.     FLUoxetine (PROZAC) 40 MG capsule Take 40 mg by mouth daily.     gabapentin (NEURONTIN) 300 MG capsule Take 1 capsule (300 mg total) by mouth at bedtime. 30 capsule 1   guaiFENesin 200 MG tablet Take 200 mg by mouth 2 (two) times daily.     HUMALOG KWIKPEN 100 UNIT/ML KwikPen Inject into the skin. Per sliding scale     HYDROcodone-acetaminophen (NORCO) 7.5-325 MG tablet Take 1 tablet by mouth 4 (four) times daily. 15 tablet 0   insulin aspart (NOVOLOG) 100 UNIT/ML injection Inject 2-10 Units into the skin See admin instructions. Sliding scale if 200-250 = 2 units, 251-300 = 4 units, 301-350 = 6 units, 351-400 = 8 units, 401-500 units = 10 units before meals and at betime (Patient not taking: Reported on 04/24/2022)     ipratropium-albuterol (DUONEB) 0.5-2.5 (3) MG/3ML SOLN Take 3 mLs by nebulization every 4 (four) hours as needed (shortness of breath / wheezing).     LANTUS 100 UNIT/ML injection Inject 0.22 mLs (22 Units total) into the skin at bedtime. 10 mL 11   magnesium oxide (MAG-OX) 400 MG tablet Take 400 mg by mouth daily.     metoprolol tartrate (LOPRESSOR) 25 MG tablet Take 1 tablet (25 mg total) by  mouth 2 (two) times daily.     Nutritional Supplement LIQD Take 120 mLs by mouth daily. "House supplement"     pantoprazole (PROTONIX) 40 MG tablet Take 1 tablet (40 mg total) by mouth daily before breakfast. 30 tablet 0   polyethylene glycol (MIRALAX / GLYCOLAX) 17 g packet Take 17 g by mouth daily. For constipation     senna (SENOKOT) 8.6 MG tablet Take 2 tablets by mouth 2 (two) times daily.     senna-docusate (SENOKOT-S) 8.6-50 MG tablet Take 1 tablet by mouth at bedtime as needed for mild constipation.     sodium zirconium cyclosilicate (LOKELMA) 10 g PACK packet Take 10 g by mouth 3 (three) times daily.     tamsulosin (FLOMAX) 0.4 MG CAPS capsule Take 1 capsule (0.4 mg total) by mouth daily after supper. (Patient taking differently: Take 0.4 mg by mouth daily.) 30 capsule 2   tiotropium (SPIRIVA HANDIHALER) 18 MCG inhalation capsule Place 1 capsule (18 mcg total) into inhaler and inhale daily. 30 capsule 2   torsemide (DEMADEX) 20 MG tablet Take 4 tablets (80 mg total) by mouth daily.      Allergies as of 06/18/2022 - Review Complete 06/18/2022  Allergen Reaction Noted   Baclofen  03/27/2022   Blueberry flavor Other (See Comments) 05/29/2016   Cucumber extract Other (See Comments) 08/01/2012   Flexeril [cyclobenzaprine hcl] Other (See Comments) 07/20/2010   Kiwi extract Other (See Comments) 08/01/2012    Past Medical History:  Diagnosis Date  Anemia    Anxiety    Chronic pain    legs, back; MRI 05/2012 with mild thoracic degenerative changes no spinal stenosis    CKD (chronic kidney disease) 05/12/2019   Stage IV   COPD (chronic obstructive pulmonary disease) ([place])    COVID-19    Depression    Diabetic peripheral neuropathy ([name])    "chronic" (44/08/4740)   Diastolic CHF ([name])    Duodenal ulcer hemorrhage    DVT (deep venous thrombosis) ([name])    right upper arm   GERD (gastroesophageal reflux disease)    Hypercholesteremia    Hypertension    Iron deficiency anemia     Osteomyelitis of ankle ([name])    Peripheral edema    PVD (peripheral vascular disease) ([place])    Spinal stenosis    mild lumbar (MRI 05/2012)-L2-L3 to L4-L5 , mild lumbar foraminal stenosis    Stroke ([place]) 05/2012   Subacute, lacunar infarcts within the left basal ganglia and posterior limp of the left internal capsule/thalamus; "RUE; both feet weak" (05/27/2012)   Tremor    Type II diabetes mellitus ([place])     Past Surgical History:  Procedure Laterality Date   ANKLE SURGERY     APPLICATION OF WOUND VAC Left 03/28/2022   Procedure: APPLICATION OF WOUND VAC;  Surgeon: [name], MD;  Location: [facility];  Service: Orthopedics;  Laterality: Left;   ESOPHAGOGASTRODUODENOSCOPY (EGD) WITH PROPOFOL N/A 12/21/2016   [place]: Ulcerative reflux esophagitis, erosive gastropathy, extensive duodenal ulceration likely site of bleeding.  Pathology with mild gastritis, no H. pylori   ESOPHAGOGASTRODUODENOSCOPY (EGD) WITH PROPOFOL N/A 04/02/2017   Fields: ESOPAHGITIS/GASTRIC AND DUODENAL ULCERS HEALED. mild duodenitis   HARDWARE REMOVAL Left 03/28/2022   Procedure: LEFT ANKLE REMOVAL DEEP HARDWARE, REMOVE BONE AND APPLY TISSUE GRAFT;  Surgeon: [name], MD;  Location: [facility];  Service: Orthopedics;  Laterality: Left;   HERNIA REPAIR  01/05/2004   "belly button" (05/27/2012)   IR FLUORO GUIDE CV LINE RIGHT  04/03/2022   IR REMOVAL TUN CV CATH W/O FL  05/10/2022   IR US GUIDE VASC ACCESS RIGHT  04/03/2022    Family History  Problem Relation Age of Onset   Colon cancer Neg Hx     Social History   Socioeconomic History   Marital status: Legally Separated    Spouse name: Not on file   Number of children: Not on file   Years of education: Not on file   Highest education level: Not on file  Occupational History   Not on file  Tobacco Use   Smoking status: Every Day    Packs/day: 0.10    Years: 54.00    Total pack years: 5.40    Types: Cigarettes    Last attempt to quit: 12/12/2019    Years  since quitting: 2.5   Smokeless tobacco: Never   Tobacco comments:    A pack a week  Vaping Use   Vaping Use: Never used  Substance and Sexual Activity   Alcohol use: No    Comment: 05/27/2012 "drank gallons and gallons 20 yr ago or so; last drink  at least 10 yr ago"   Drug use: No   Sexual activity: Not Currently  Other Topics Concern   Not on file  Social History Narrative   Pt is living at [facility] home for 6 years now    He is divorced. Has one kid.   Not working for about 7 years now. Used to do  plumbing work.   Social Determinants of Health   Financial Resource Strain: Not on file  Food Insecurity: No Food Insecurity (04/12/2022)   Hunger Vital Sign    Worried About Running Out of Food in the Last Year: Never true    Ran Out of Food in the Last Year: Never true  Transportation Needs: No Transportation Needs (04/12/2022)   PRAPARE - Hydrologist (Medical): No    Lack of Transportation (Non-Medical): No  Physical Activity: Not on file  Stress: Not on file  Social Connections: Not on file  Intimate Partner Violence: Not At Risk (04/12/2022)   Humiliation, Afraid, Rape, and Kick questionnaire    Fear of Current or Ex-Partner: No    Emotionally Abused: No    Physically Abused: No    Sexually Abused: No     Review of System:   General: Negative for  weight loss, fever, chills, fatigue. See hpi Eyes: Negative for vision changes.  ENT: Negative for hoarseness, difficulty swallowing , nasal congestion. CV: Negative for chest pain, angina, palpitations, dyspnea on exertion, peripheral edema.  Respiratory: Negative for dyspnea at rest, dyspnea on exertion, cough, sputum, wheezing.  GI: See history of present illness. GU:  Negative for dysuria, hematuria, urinary incontinence, urinary frequency, nocturnal urination.  MS: Negative for joint pain, low back pain. Complains of left lower extremity pain. Derm: Negative for rash or itching.   Neuro: Negative for weakness, abnormal sensation, seizure, frequent headaches, memory loss, confusion.  Psych: Negative for anxiety, depression, suicidal ideation, hallucinations.  Endo: Negative for unusual weight change.  Heme: Negative for bruising or bleeding. Allergy: Negative for rash or hives.      Physical Examination:   Vital signs in last 24 hours: Temp:  [95.7 F (35.4 C)-97.9 F (36.6 C)] 97.8 F (36.6 C) (01/16 0630) Pulse Rate:  [59-84] 70 (01/16 0630) Resp:  [10-22] 11 (01/16 0630) BP: (108-158)/(52-129) 122/67 (01/16 0630) SpO2:  [92 %-100 %] 97 % (01/16 0630)    General: chronically ill appearing male well-developed in no acute distress.  Head: Normocephalic, atraumatic.   Eyes: Conjunctiva pale, no icterus. Mouth: Oropharyngeal mucosa moist and pink , no lesions erythema or exudate. Neck: Supple without thyromegaly, masses, or lymphadenopathy.  Lungs: Clear to auscultation bilaterally.  Heart: Regular rate and rhythm, no murmurs rubs or gallops.  Abdomen: Bowel sounds are normal, nontender, nondistended, no hepatosplenomegaly or masses, no abdominal bruits or hernia , no rebound or guarding.   Rectal: not performed Extremities: chronic venous status changed bilateral lower extremities. LLE with wound/drainage.  Neuro: Alert and oriented x 4.  Skin: Warm and dry, no rash or jaundice.   Psych: Alert and cooperative, normal mood and affect.        Intake/Output from previous day: No intake/output data recorded. Intake/Output this shift: No intake/output data recorded.  Lab Results:   CBC Recent Labs    06/18/22 1917 06/19/22 0010 06/19/22 0555  WBC 7.8 9.0 8.7  HGB 7.8* 7.9* 8.1*  HCT 26.8* 26.9* 27.8*  MCV 100.0 98.9 99.3  PLT 244 262 274   BMET Recent Labs    06/18/22 1917 06/19/22 0555  NA 142 143  K 3.7 3.9  CL 110 109  CO2 23 23  GLUCOSE 92 96  BUN 65* 62*  CREATININE 5.02* 4.91*  CALCIUM 7.9* 8.1*   LFT Recent Labs     06/18/22 1917  BILITOT 0.7  ALKPHOS 65  AST 9*  ALT 8  PROT 6.1*  ALBUMIN 2.4*    Lipase No results for input(s): "LIPASE" in the last 72 hours.  PT/INR No results for input(s): "LABPROT", "INR" in the last 72 hours.   Hepatitis Panel No results for input(s): "HEPBSAG", "HCVAB", "HEPAIGM", "HEPBIGM" in the last 72 hours.   Imaging Studies:   DG Chest 1 View  Result Date: 06/18/2022 CLINICAL DATA:  Altered mental status. EXAM: CHEST  1 VIEW COMPARISON:  Chest radiograph 04/12/2022, CT chest 04/12/2022 FINDINGS: The heart is enlarged.  The upper mediastinal contours are normal. Previously seen opacity in the left upper lobe has essentially resolved, with mild residual patchy opacity which may reflect residual scarring. There is no new or worsening focal airspace disease. There are diffusely increased interstitial markings possibly reflecting mild pulmonary interstitial edema. There is no significant pleural effusion. There is no pneumothorax. Note that the smaller nodular opacity seen on the prior chest CT are not seen on the current study. There is no acute osseous abnormality. IMPRESSION: 1. Cardiomegaly with possible mild pulmonary interstitial edema. 2. Essentially resolved airspace opacity in the left upper lobe with probable residual scarring. 3. Additional smaller nodules seen on the prior chest CT are not seen on the current study. Please refer to that study for follow-up recommendations. Electronically Signed   By: Valetta Mole M.D.   On: 06/18/2022 20:09   DG Tibia/Fibula Left  Result Date: 06/18/2022 CLINICAL DATA:  Left lower extremity erythema EXAM: LEFT TIBIA AND FIBULA - 2 VIEW COMPARISON:  03/22/2022 FINDINGS: Frontal and lateral views of the left tibia and fibula are obtained. There is a residual screw traversing the medial malleolus. K-wire within the medial malleolus and the plate and screw fixation across the distal fibula have been removed in the interim. New  heterotopic ossification along the dorsal margin of the distal fibula. There are no acute displaced fractures. The bones are diffusely osteopenic. No evidence of bony destruction or periosteal reaction to suggest osteomyelitis. There is diffuse subcutaneous edema. IMPRESSION: 1. Diffuse subcutaneous edema consistent with cellulitis. 2. No radiographic evidence of osteomyelitis. 3. Diffuse osteopenia. 4. Revision of orthopedic hardware about the left ankle as above. Electronically Signed   By: Randa Ngo M.D.   On: 06/18/2022 20:06   CT Head Wo Contrast  Result Date: 06/18/2022 CLINICAL DATA:  Altered mental status EXAM: CT HEAD WITHOUT CONTRAST TECHNIQUE: Contiguous axial images were obtained from the base of the skull through the vertex without intravenous contrast. RADIATION DOSE REDUCTION: This exam was performed according to the departmental dose-optimization program which includes automated exposure control, adjustment of the mA and/or kV according to patient size and/or use of iterative reconstruction technique. COMPARISON:  05/17/2019 FINDINGS: Brain: No mass, hemorrhage or extra-axial collection. There is periventricular hypoattenuation compatible with chronic microvascular disease. 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 Generalized volume loss. Vascular: Atherosclerotic calcification of the internal carotid and vertebral arteries at skull base. Skull: Normal. Negative for fracture or focal lesion. Sinuses/Orbits: No acute finding. Other: None. IMPRESSION: 1. No acute intracranial abnormality. 2. 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 Electronically Signed   By: Ulyses Jarred M.D.   On: 06/18/2022 19:50  [4 week]  Assessment:   72 y/o male with CKD stage IV, COPD on home oxygen, DM, DVT, HTN, PVD, CVA, Afib on Eliquis, ascending thoracic aneurysm, recent PNA, osteomyelitis of left ankle with recent admission s/p hardware removal and  debridement, presenting with AMS, diminished oral intake. GI consulted  for acute on chronic anemia, melena.   Melena: slight decline in Hgb from 8.5 to 7.8. Stable overnight. Report of melena in the ED last night. Patient denies any bleeding prior to admission. Denies GI symptoms. History of duodenal ulcers in 2018. He is chronically anticoagulated there would be advised to evaluate upper GI tract this admission.  Plan:   Plans for EGD tomorrow to allow for Eliquis washout.  Clear liquids today. NPO after midnight.  PPI infusion for now.  He needs to remain on PPI as outpatient.    LOS: 1 day   We would like to thank you for the opportunity to participate in the care of [place].  Laureen Ochs. Bernarda Caffey [facility] [phone] 1/16/20248:07 AM

## 2022-06-19 NOTE — Hospital Course (Signed)
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  The patient was recently hospitalized from 04/12/2022 to 04/20/2022 secondary to acute on chronic respiratory failure due to COPD exacerbation, pneumonia and acute on chronic HFpEF.  During that hospitalization, the patient also had acute on chronic renal failure.  He was felt to be hypervolemic and started on torsemide.   Per triage notes and ED provider who talked to staff at facility, at about 5 PM staff noticed that patient was confused.  Reported his blood sugars ranging in the 400s subsequently dropped to 68>> 130.  It is unknown if insulin was given when his blood sugars were in the 400s, a different staff had taken over patient's care.  Glucagon was given.  He has also had chronic left lower extremity pain which is unchanged. He also got his Lantus on the day of admission. In the ED, the patient was afebrile hemodynamically stable with oxygen saturation 92-95% on 4 L.  WBC 8.7, hemoglobin 8.1, platelets 274,000.  Sodium 143, potassium 3.9, bicarbonate 23, serum creatinine 5.02.   0.4 mg IV Narcan given, with some initial improvement in mental status, subsequent 0.8 mg Narcan given without significant response.  Patient was admitted for altered mental status and acute on chronic renal failure.

## 2022-06-19 NOTE — Consult Note (Addendum)
[name] Nurse Consult Note: Reason for Consult: Consult requested for left leg wound.  Performed remotely after review of progress notes and photos in the EMR.  Pt is familiar to [facility] team from previous visits and has been followed by Dr Sharol Given of the ortho service in the past. Left outer ankle with chronic full thickness wound; 10% black, 40% yellow, 50% red.  Small amt tan drainage. Anterior calf and ankle areas with red dry linear partial thickness wounds.  Dressing procedure/placement/frequency: Topical treatment ordered provided for bedside nurses to perform as follows to absorb drainage and promote healing: Apply Aquacel Kellie Simmering # [phone]) to left outer leg wound Q day.  Cover other open areas on leg with Xeroform gauze, then cover everything with ABD pads and kerlex. Please re-consult if further assistance is needed.  Thank-you,  Julien Girt MSN, [place], [place], [place], [place]

## 2022-06-19 NOTE — NC FL2 (Signed)
[place] MEDICAID FL2 LEVEL OF CARE FORM     IDENTIFICATION  Patient Name: Eric Scott Birthdate: 09/17/1950 Sex: male Admission Date (Current Location): 06/18/2022  [place] and [place] Number:  [place] [facility] [id number] [place] and Address:  [place] [address], [place]      Provider Number: [id number]  Attending Physician Name and Address:  Orson Eva, MD  Relative Name and Phone Number:  [place], Kohles (Son) [phone]    Current Level of Care: Hospital Recommended Level of Care: [place] Prior Approval Number:    Date Approved/Denied:   PASRR Number:    Discharge Plan: SNF    Current Diagnoses: Patient Active Problem List   Diagnosis Date Noted   Melena 07/37/1062   Acute metabolic encephalopathy 69/48/5462   UTI (urinary tract infection) 06/18/2022   Status post peripherally inserted central catheter (PICC) central line placement 04/24/2022   PAF (paroxysmal atrial fibrillation) ([place]) 04/06/2022   Mood disorder ([place]) 04/02/2022   Stage 4 chronic kidney disease ([place])    Osteomyelitis of ankle or foot, acute, left ([place]) 03/29/2022   Ulcer of left lower leg, with necrosis of bone ([place]) 03/28/2022   Abscess of left lower leg 70/35/0093   Hardware complicating wound infection ([place]) 03/28/2022   Leg wound, left, sequela 03/28/2022   Cholelithiasis 05/12/2020   Elevated liver enzymes    RUQ pain    Bacteremia 05/11/2020   COPD  GOLD ?  / AB  01/26/2020   Acute on chronic respiratory failure with hypoxia ([place]) 12/18/2019   Respiratory failure with hypoxia ([place]) 11/17/2019   Acute on chronic anemia 11/16/2019   Cellulitis of both lower extremities 11/16/2019   Leukocytosis 05/22/2019   Chronic heart failure with preserved ejection fraction (HFpEF) ([place]) 05/22/2019   Acute respiratory failure ([place]) 05/17/2019   Obesity, Class III, BMI 40-49.9 (morbid obesity) ([place]) 05/17/2019   Atrial flutter, paroxysmal ([place])  05/17/2019   COPD with acute exacerbation ([place]) 04/20/2019   GERD (gastroesophageal reflux disease) 08/08/2018   PUD (peptic ulcer disease)    Gastric erosions 02/13/2017   Duodenal ulcer 02/13/2017   Transaminitis    UGI bleed    Arm DVT (deep venous thromboembolism), acute, right ([place]) 12/18/2016   Uremia 12/10/2016   Acute renal failure ([name])    Volume depletion 11/23/2016   Hypotension    Liver enzyme elevation    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 ([place]) 11/15/2016   Arterial insufficiency of lower extremity ([place]) 11/15/2016   Pressure injury of skin 11/09/2016   Sepsis ([place]) 11/09/2016   Osteomyelitis ([place]) 11/09/2016   Wounds, multiple 11/08/2016   Chronic venous insufficiency 10/12/2016   Critical lower limb ischemia ([place]) 10/12/2016   Fatty liver 09/10/2016   History of colonic polyps 09/10/2016   Gallstone 09/10/2016   AKI (acute kidney injury) ([place]) 03/04/2015   Fall 03/04/2015   Generalized weakness 03/04/2015   Diabetic polyneuropathy associated with type 2 diabetes mellitus ([place])    Anxiety    PVD (peripheral vascular disease) ([place])    Pneumonia 07/20/2014   Anxiety state 04/18/2014   Acute on chronic diastolic CHF (congestive heart failure) ([place]) 04/07/2014   Solitary pulmonary nodule 04/07/2014   COPD with exacerbation ([place]) 04/02/2014   Hypoxia 04/02/2014   Cerebral infarction ([place]) 06/01/2012   Chronic pain (back, legs) 05/30/2012   Toe laceration, 4th toe 05/30/2012   C. difficile diarrhea 05/30/2012   Hypokalemia 05/30/2012   Acute lacunar  stroke ([place]) 05/27/2012   Type 2 diabetes mellitus with stage 4 chronic kidney disease, with Davidson-term current use of insulin ([place]) 05/27/2012   Smoker 05/27/2012   HTN (hypertension) 05/27/2012    Orientation RESPIRATION BLADDER Height & Weight     Self  Normal Incontinent, External catheter Weight: 270 lb 11.6 oz (122.8 kg) Height:  6' (182.9 cm)   BEHAVIORAL SYMPTOMS/MOOD NEUROLOGICAL BOWEL NUTRITION STATUS      Incontinent Diet (see d/c summary)  AMBULATORY STATUS COMMUNICATION OF NEEDS Skin   Total Care (self propels wc) Verbally Other (Comment) (wound, left leg)                       Personal Care Assistance Level of Assistance  Bathing, Feeding, Dressing Bathing Assistance: Limited assistance Feeding assistance: Limited assistance Dressing Assistance: Limited assistance     Functional Limitations Info  Sight, Hearing, Speech Sight Info: Adequate Hearing Info: Adequate Speech Info: Adequate    SPECIAL CARE FACTORS FREQUENCY                       Contractures Contractures Info: Not present    Additional Factors Info  Code Status, Allergies, Psychotropic Code Status Info: Full Code Allergies Info: Baclofen, blueberry flavor, cucumber extract, flexeril, kiwi extract Psychotropic Info: xanax, prozac         Current Medications (06/19/2022):  This is the current hospital active medication list Current Facility-Administered Medications  Medication Dose Route Frequency Provider Last Rate Last Admin   acetaminophen (TYLENOL) tablet 650 mg  650 mg Oral Q6H PRN Emokpae, Ejiroghene E, MD       Or   acetaminophen (TYLENOL) suppository 650 mg  650 mg Rectal Q6H PRN Emokpae, Ejiroghene E, MD       amiodarone (PACERONE) tablet 200 mg  200 mg Oral Daily Tat, David, MD   200 mg at 06/19/22 1004   calcitRIOL (ROCALTROL) capsule 0.25 mcg  0.25 mcg Oral Daily Corliss Parish, MD   0.25 mcg at 06/19/22 1133   cefTRIAXone (ROCEPHIN) 2 g in sodium chloride 0.9 % 100 mL IVPB  2 g Intravenous Q24H Emokpae, Ejiroghene E, MD       Darbepoetin Alfa (ARANESP) injection 300 mcg  300 mcg Subcutaneous Q Tue-1800 Corliss Parish, MD       dextrose 5 %-0.9 % sodium chloride infusion   Intravenous Continuous Tat, David, MD 50 mL/hr at 06/19/22 0822 New Bag at 06/19/22 0175   FLUoxetine (PROZAC) capsule 40 mg  40 mg Oral  Daily Tat, David, MD   40 mg at 06/19/22 1005   fluticasone furoate-vilanterol (BREO ELLIPTA) 100-25 MCG/ACT 1 puff  1 puff Inhalation Daily Emokpae, Ejiroghene E, MD       insulin aspart (novoLOG) injection 0-9 Units  0-9 Units Subcutaneous Q4H Emokpae, Ejiroghene E, MD   9 Units at 06/19/22 0552   ipratropium-albuterol (DUONEB) 0.5-2.5 (3) MG/3ML nebulizer solution 3 mL  3 mL Nebulization Q4H PRN Emokpae, Ejiroghene E, MD       metoprolol tartrate (LOPRESSOR) tablet 25 mg  25 mg Oral BID Tat, David, MD   25 mg at 06/19/22 1005   [START ON 06/22/2022] pantoprazole (PROTONIX) injection 40 mg  40 mg Intravenous Q12H Emokpae, Ejiroghene E, MD       pantoprozole (PROTONIX) 80 mg /NS 100 mL infusion  8 mg/hr Intravenous Continuous Emokpae, Ejiroghene E, MD   Stopped at 06/19/22 1025   polyethylene glycol (MIRALAX / GLYCOLAX) packet 17  g  17 g Oral Daily PRN Emokpae, Ejiroghene E, MD       torsemide (DEMADEX) tablet 60 mg  60 mg Oral Daily Corliss Parish, MD   60 mg at 06/19/22 1133   umeclidinium bromide (INCRUSE ELLIPTA) 62.5 MCG/ACT 1 puff  1 puff Inhalation Daily Emokpae, Ejiroghene E, MD         Discharge Medications: Please see discharge summary for a list of discharge medications.  Relevant Imaging Results:  Relevant Lab Results:   Additional Information    Zoiey Christy, Clydene Pugh, LCSW

## 2022-06-19 NOTE — ED Notes (Signed)
Facility called for update.  

## 2022-06-19 NOTE — ED Notes (Signed)
Pt was informed and consent was signed. Signature is not readable due to pt tremors.

## 2022-06-20 DIAGNOSIS — D649 Anemia, unspecified: Secondary | ICD-10-CM | POA: Diagnosis not present

## 2022-06-20 DIAGNOSIS — I4892 Unspecified atrial flutter: Secondary | ICD-10-CM | POA: Diagnosis not present

## 2022-06-20 DIAGNOSIS — E1122 Type 2 diabetes mellitus with diabetic chronic kidney disease: Secondary | ICD-10-CM | POA: Diagnosis not present

## 2022-06-20 DIAGNOSIS — G9341 Metabolic encephalopathy: Secondary | ICD-10-CM | POA: Diagnosis not present

## 2022-06-20 DIAGNOSIS — K921 Melena: Secondary | ICD-10-CM

## 2022-06-20 DIAGNOSIS — N189 Chronic kidney disease, unspecified: Secondary | ICD-10-CM

## 2022-06-20 LAB — RENAL FUNCTION PANEL
Albumin: 2.4 g/dL — ABNORMAL LOW (ref 3.5–5.0)
Anion gap: 9 (ref 5–15)
BUN: 56 mg/dL — ABNORMAL HIGH (ref 8–23)
CO2: 24 mmol/L (ref 22–32)
Calcium: 8.2 mg/dL — ABNORMAL LOW (ref 8.9–10.3)
Chloride: 110 mmol/L (ref 98–111)
Creatinine, Ser: 4.57 mg/dL — ABNORMAL HIGH (ref 0.61–1.24)
GFR, Estimated: 13 mL/min — ABNORMAL LOW (ref >60)
Glucose, Bld: 92 mg/dL (ref 70–99)
Phosphorus: 4.4 mg/dL (ref 2.5–4.6)
Potassium: 3.7 mmol/L (ref 3.5–5.1)
Sodium: 143 mmol/L (ref 135–145)

## 2022-06-20 LAB — URINE CULTURE

## 2022-06-20 LAB — GLUCOSE, CAPILLARY
Glucose-Capillary: 88 mg/dL (ref 70–99)
Glucose-Capillary: 88 mg/dL (ref 70–99)
Glucose-Capillary: 107 mg/dL — ABNORMAL HIGH (ref 70–99)
Glucose-Capillary: 112 mg/dL — ABNORMAL HIGH (ref 70–99)
Glucose-Capillary: 111 mg/dL — ABNORMAL HIGH (ref 70–99)

## 2022-06-20 LAB — MRSA NEXT GEN BY PCR, NASAL: MRSA by PCR Next Gen: DETECTED — AB

## 2022-06-20 MED ORDER — SODIUM CHLORIDE 0.9 % IV SOLN: INTRAVENOUS | Status: DC

## 2022-06-20 MED ORDER — MUPIROCIN 2 % EX OINT
TOPICAL_OINTMENT | Freq: Two times a day (BID) | CUTANEOUS | Status: DC
  Administered 2022-06-23 – 2022-07-01 (×3): 1 via NASAL
  Filled 2022-06-20 (×4): qty 22

## 2022-06-20 MED ORDER — SODIUM CHLORIDE 0.9 % IV SOLN
1.0000 g | INTRAVENOUS | Status: AC
  Administered 2022-06-20 – 2022-06-24 (×5): 1 g via INTRAVENOUS
  Filled 2022-06-20 (×5): qty 10

## 2022-06-20 NOTE — Progress Notes (Signed)
Subjective: Patient alert, oriented to person, disoriented to place and time. He denies any abdominal pain, nausea or vomiting. Does not think he has had any BMs this morning.   Objective: Vital signs in last 24 hours: Temp:  [97.9 F (36.6 C)-98.3 F (36.8 C)] 98 F (36.7 C) (01/17 0731) Pulse Rate:  [62-90] 85 (01/17 0731) Resp:  [5-25] 18 (01/17 0731) BP: (127-172)/(53-135) 167/87 (01/17 0343) SpO2:  [84 %-100 %] 95 % (01/17 0746) Weight:  [122.8 kg] 122.8 kg (01/16 1204) Last BM Date : 06/19/22 General:   Alert, oriented to person, disoriented to placed and time  Head:  Normocephalic and atraumatic. Eyes:  No icterus, sclera clear. Conjuctiva pink.  Mouth:  Without lesions, mucosa pink and moist.  Heart:  S1, S2 present, no murmurs noted.  Lungs: Clear to auscultation bilaterally, without wheezing, rales, or rhonchi.  Abdomen:  Bowel sounds present, soft, non-tender, non-distended. No HSM or hernias noted. No rebound or guarding. No masses appreciated  Msk:  Symmetrical without gross deformities. Normal posture. Pulses:  Normal pulses noted. Extremities:  Without clubbing or edema. Neurologic:  Alert and oriented to person, disoriented to place and time.  Skin:  Warm and dry, intact without significant lesions.  Psych:  Alert and cooperative. Normal mood   Intake/Output from previous day: 01/16 0701 - 01/17 0700 In: 1263.5 [I.V.:1163.5; IV Piggyback:99.9] Out: 850 [Urine:850] Intake/Output this shift: No intake/output data recorded.  Lab Results: Recent Labs    06/19/22 0010 06/19/22 0555 06/19/22 1138  WBC 9.0 8.7 9.7  HGB 7.9* 8.1* 8.1*  HCT 26.9* 27.8* 28.0*  PLT 262 274 276   BMET Recent Labs    06/18/22 1917 06/19/22 0555 06/20/22 0428  NA 142 143 143  K 3.7 3.9 3.7  CL 110 109 110  CO2 '23 23 24  '$ GLUCOSE 92 96 92  BUN 65* 62* 56*  CREATININE 5.02* 4.91* 4.57*  CALCIUM 7.9* 8.1* 8.2*   LFT Recent Labs    06/18/22 1917 06/20/22 0428  PROT  6.1*  --   ALBUMIN 2.4* 2.4*  AST 9*  --   ALT 8  --   ALKPHOS 65  --   BILITOT 0.7  --     Studies/Results: DG Chest 1 View  Result Date: 06/18/2022 CLINICAL DATA:  Altered mental status. EXAM: CHEST  1 VIEW COMPARISON:  Chest radiograph 04/12/2022, CT chest 04/12/2022 FINDINGS: The heart is enlarged.  The upper mediastinal contours are normal. Previously seen opacity in the left upper lobe has essentially resolved, with mild residual patchy opacity which may reflect residual scarring. There is no new or worsening focal airspace disease. There are diffusely increased interstitial markings possibly reflecting mild pulmonary interstitial edema. There is no significant pleural effusion. There is no pneumothorax. Note that the smaller nodular opacity seen on the prior chest CT are not seen on the current study. There is no acute osseous abnormality. IMPRESSION: 1. Cardiomegaly with possible mild pulmonary interstitial edema. 2. Essentially resolved airspace opacity in the left upper lobe with probable residual scarring. 3. Additional smaller nodules seen on the prior chest CT are not seen on the current study. Please refer to that study for follow-up recommendations. Electronically Signed   By: Valetta Mole M.D.   On: 06/18/2022 20:09   DG Tibia/Fibula Left  Result Date: 06/18/2022 CLINICAL DATA:  Left lower extremity erythema EXAM: LEFT TIBIA AND FIBULA - 2 VIEW COMPARISON:  03/22/2022 FINDINGS: Frontal and lateral views of the left tibia and fibula  are obtained. There is a residual screw traversing the medial malleolus. K-wire within the medial malleolus and the plate and screw fixation across the distal fibula have been removed in the interim. New heterotopic ossification along the dorsal margin of the distal fibula. There are no acute displaced fractures. The bones are diffusely osteopenic. No evidence of bony destruction or periosteal reaction to suggest osteomyelitis. There is diffuse subcutaneous  edema. IMPRESSION: 1. Diffuse subcutaneous edema consistent with cellulitis. 2. No radiographic evidence of osteomyelitis. 3. Diffuse osteopenia. 4. Revision of orthopedic hardware about the left ankle as above. Electronically Signed   By: Randa Ngo M.D.   On: 06/18/2022 20:06   CT Head Wo Contrast  Result Date: 06/18/2022 CLINICAL DATA:  Altered mental status EXAM: CT HEAD WITHOUT CONTRAST TECHNIQUE: Contiguous axial images were obtained from the base of the skull through the vertex without intravenous contrast. RADIATION DOSE REDUCTION: This exam was performed according to the departmental dose-optimization program which includes automated exposure control, adjustment of the mA and/or kV according to patient size and/or use of iterative reconstruction technique. COMPARISON:  05/17/2019 FINDINGS: Brain: No mass, hemorrhage or extra-axial collection. There is periventricular hypoattenuation compatible with chronic microvascular disease. 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 Generalized volume loss. Vascular: Atherosclerotic calcification of the internal carotid and vertebral arteries at skull base. Skull: Normal. Negative for fracture or focal lesion. Sinuses/Orbits: No acute finding. Other: None. IMPRESSION: 1. No acute intracranial abnormality. 2. 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 Electronically Signed   By: Ulyses Jarred M.D.   On: 06/18/2022 19:50    Assessment: Eric Scott is a 72 year old male with CKD stage IV, COPD on home oxygen, DM, DVT, HTN, PVD, CVA, Afib on Eliquis, ascending thoracic aneurysm, recent PNA, osteomyelitis of left ankle w/recent admission s/p hardware removal and debridement, who presented yesterday with AMS, diminished oral intake. GI consulted for acute on chronic anemia/ melena.    Melena/acute on chronic anemia: slight decline in Hgb since admission from 8.5 to 7.8, hgb is 8.1 this morning. Ferritin  149, Iron 31, TIBC 146, saturation 21%. Report of melena in the ED Monday night. No bleeding prior to admission. Denies current GI symptoms. History of duodenal ulcers in 2018. chronically anticoagulated therefore recommend evaluation of upper GI tract this admission. As last dose of Eliquis was Monday, will plan for EGD tomorrow to allow time for Eliquis wash out.     Plan: Trend h&h, transfuse for hgb <7 PPI BID Monitor for overt GI bleeding NPO midnight, EGD tomorrow    LOS: 2 days    06/20/2022, 9:15 AM   Fanchon Papania L. Alver Sorrow, MSN, APRN, AGNP-C Adult-Gerontology Nurse Practitioner [facility]

## 2022-06-20 NOTE — Progress Notes (Signed)
[facility] Progress Note    Assessment/ Plan:   AKI on CKD4 -follows with Dr. Theador Hawthorne. B/L Cr in the 3's -AKI likely related to UTI, hypoglycemia. Cr down to 4.57 today, c/w supportive care/fluids/abx -no indication for RRT at this time. If it were to come to that, would recommend palliative consultation to address Eric Scott as he is not an ideal candidate for [facility] term dialysis in the context of co-morbidities and functional status and HD will likely adversely affect his QOL -Avoid nephrotoxic medications including NSAIDs and iodinated intravenous contrast exposure unless the latter is absolutely indicated.  Preferred narcotic agents for pain control are hydromorphone, fentanyl, and methadone. Morphine should not be used. Avoid Baclofen and avoid oral sodium phosphate and magnesium citrate based laxatives / bowel preps. Continue strict Input and Output monitoring. Will monitor the patient closely with you and intervene or adjust therapy as indicated by changes in clinical status/labs   Acute on chronic metabolic encephalopathy -likely multifactorial: UTI, hypoglycemia, AKI, polypharmacy. Seems that his mental status is improving. Per primary service  UTI -on rocephin  Chronic anemia, melena/FOBT positive -GI consulted -ESA started on 1/16. 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 No acute events. No complaints. He feels like he is peeing more.   Objective:   BP (!) 154/64   Pulse 86   Temp 98 F (36.7 C) (Oral)   Resp 18   Ht 6' (1.829 m)   Wt 122.8 kg   SpO2 95%   BMI 36.72 kg/m   Intake/Output Summary (Last 24 hours) at 06/20/2022 0954 Last data filed at 06/20/2022 5465 Gross per 24 hour  Intake 1173.8 ml  Output 850 ml  Net 323.8 ml   Weight change:   Physical Exam: Gen:NAD,  chronically ill appearing CVS: RRR Resp: poor air exchange diffusely Abd: soft, nt/nd Ext: woody edema b/l LE, LLE dressing in place Neuro: awake, tremulous  Imaging: DG Chest 1 View  Result Date: 06/18/2022 CLINICAL DATA:  Altered mental status. EXAM: CHEST  1 VIEW COMPARISON:  Chest radiograph 04/12/2022, CT chest 04/12/2022 FINDINGS: The heart is enlarged.  The upper mediastinal contours are normal. Previously seen opacity in the left upper lobe has essentially resolved, with mild residual patchy opacity which may reflect residual scarring. There is no new or worsening focal airspace disease. There are diffusely increased interstitial markings possibly reflecting mild pulmonary interstitial edema. There is no significant pleural effusion. There is no pneumothorax. Note that the smaller nodular opacity seen on the prior chest CT are not seen on the current study. There is no acute osseous abnormality. IMPRESSION: 1. Cardiomegaly with possible mild pulmonary interstitial edema. 2. Essentially resolved airspace opacity in the left upper lobe with probable residual scarring. 3. Additional smaller nodules seen on the prior chest CT are not seen on the current study. Please refer to that study for follow-up recommendations. Electronically Signed   By: Valetta Mole M.D.   On: 06/18/2022 20:09   DG Tibia/Fibula Left  Result Date: 06/18/2022 CLINICAL DATA:  Left lower extremity erythema EXAM: LEFT TIBIA AND FIBULA - 2 VIEW COMPARISON:  03/22/2022 FINDINGS: Frontal and lateral views of the left tibia and fibula are obtained. There is a residual screw traversing the medial malleolus. K-wire within the medial malleolus and the plate and screw fixation across  the distal fibula have been removed in the interim. New heterotopic ossification along the dorsal margin of the distal fibula. There are no acute displaced fractures. The bones are diffusely osteopenic. No evidence of bony destruction or periosteal reaction  to suggest osteomyelitis. There is diffuse subcutaneous edema. IMPRESSION: 1. Diffuse subcutaneous edema consistent with cellulitis. 2. No radiographic evidence of osteomyelitis. 3. Diffuse osteopenia. 4. Revision of orthopedic hardware about the left ankle as above. Electronically Signed   By: Randa Ngo M.D.   On: 06/18/2022 20:06   CT Head Wo Contrast  Result Date: 06/18/2022 CLINICAL DATA:  Altered mental status EXAM: CT HEAD WITHOUT CONTRAST TECHNIQUE: Contiguous axial images were obtained from the base of the skull through the vertex without intravenous contrast. RADIATION DOSE REDUCTION: This exam was performed according to the departmental dose-optimization program which includes automated exposure control, adjustment of the mA and/or kV according to patient size and/or use of iterative reconstruction technique. COMPARISON:  05/17/2019 FINDINGS: Brain: No mass, hemorrhage or extra-axial collection. There is periventricular hypoattenuation compatible with chronic microvascular disease. 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 Generalized volume loss. Vascular: Atherosclerotic calcification of the internal carotid and vertebral arteries at skull base. Skull: Normal. Negative for fracture or focal lesion. Sinuses/Orbits: No acute finding. Other: None. IMPRESSION: 1. No acute intracranial abnormality. 2. 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 Electronically Signed   By: Ulyses Jarred M.D.   On: 06/18/2022 19:50    Labs: BMET Recent Labs  Lab 06/18/22 1917 06/19/22 0555 06/20/22 0428  NA 142 143 143  K 3.7 3.9 3.7  CL 110 109 110  CO2 '23 23 24  '$ GLUCOSE 92 96 92  BUN 65* 62* 56*  CREATININE 5.02* 4.91* 4.57*  CALCIUM 7.9* 8.1* 8.2*  PHOS  --   --  4.4   CBC Recent Labs  Lab 06/18/22 1917 06/19/22 0010 06/19/22 0555 06/19/22 1138  WBC 7.8 9.0 8.7 9.7  NEUTROABS 6.6  --   --   --   HGB 7.8* 7.9* 8.1* 8.1*  HCT 26.8*  26.9* 27.8* 28.0*  MCV 100.0 98.9 99.3 100.0  PLT 244 262 274 276    Medications:     amiodarone  200 mg Oral Daily   calcitRIOL  0.25 mcg Oral Daily   Chlorhexidine Gluconate Cloth  6 each Topical Daily   darbepoetin (ARANESP) injection - NON-DIALYSIS  300 mcg Subcutaneous Q Tue-1800   FLUoxetine  40 mg Oral Daily   fluticasone furoate-vilanterol  1 puff Inhalation Daily   insulin aspart  0-9 Units Subcutaneous Q4H   metoprolol tartrate  25 mg Oral BID   mupirocin ointment   Nasal BID   nystatin  5 mL Oral QID   [START ON 06/22/2022] pantoprazole  40 mg Intravenous Q12H   torsemide  60 mg Oral Daily   umeclidinium bromide  1 puff Inhalation Daily      Gean Quint, MD [facility] 06/20/2022, 9:54 AM

## 2022-06-20 NOTE — Progress Notes (Signed)
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  The patient was recently hospitalized from 04/12/2022 to 04/20/2022 secondary to acute on chronic respiratory failure due to COPD exacerbation, pneumonia and acute on chronic HFpEF.  During that hospitalization, the patient also had acute on chronic renal failure.  He was felt to be hypervolemic and started on torsemide.   Per triage notes and ED provider who talked to staff at facility, at about 5 PM staff noticed that patient was confused.  Reported his blood sugars ranging in the 400s subsequently dropped to 68>> 130.  It is unknown if insulin was given when his blood sugars were in the 400s, a different staff had taken over patient's care.  Glucagon was given.  He has also had chronic left lower extremity pain which is unchanged. He also got his Lantus on the day of admission. In the ED, the patient was afebrile hemodynamically stable with oxygen saturation 92-95% on 4 L.  WBC 8.7, hemoglobin 8.1, platelets 274,000.  Sodium 143, potassium 3.9, bicarbonate 23, serum creatinine 5.02.   0.4 mg IV Narcan given, with some initial improvement in mental status, subsequent 0.8 mg Narcan given without significant response.  Patient was admitted for altered mental status and acute on chronic renal failure.   Assessment/Plan: Acute on chronic metabolic encephalopathy -Multifactorial including hypoglycemia, UTI, AKI, polypharmacy in the setting of impaired renal function -06/18/2022 ABG 7.36/47/74/26 on 2  L -06/19/2022--mental status slightly improved but not at baseline -Mentation overall improving and close to baseline. -CT brain negative -Continue IV fluid resuscitation and IV antibiotics -Continue holding gabapentin  UTI -UA>50 WBC -Continue ceftriaxone therapy -Cultures not helpful given multiple species isolated -Will treat empirically for 5 days.  Acute on chronic renal failure--CKD stage IV -Baseline creatinine 3.3-3.7 -Presented with serum creatinine 5.02 -Nephrology consult appreciated -Creatinine trending down with fluid resuscitation and supportive care -Continue to minimize/avoid the use of nephrotoxic agents and follow creatinine. -No requiring hemodialysis at this point.  Melena/FOBT positive -GI consultation appreciated. -holding apixaban temporarily -N.p.o. after midnight; planning for EGD in AM. -had melena in ED at time of admission.  No further overt bleeding appreciated. -Continue PPI.  Paroxysmal atrial flutter -In and out of A-fib/flutter -Continue telemetry monitoring -Continue the use of amiodarone and metoprolol -Rate appears to be stable and well-controlled currently -Holding anticoagulation in the setting of melanotic stools and positive fecal occult blood test. -Plan for endoscopy in AM.  Polymicrobial left ankle osteomyelitis- -s/p debridement/hardware removal by Dr. Sharol Given on 10/25  -finished 6 weeks abx on 05/09/22 -finished linezolid 11/25-12/6 -Continue wound care service recommendation.  Chronic respiratory failure with hypoxia and hypercarbia/COPD -on 3-4L at baseline -continue BDs as needed. -No wheezing or signs of acute exacerbation currently.  Controlled DM2  -03/28/22 A1C--5.8 -hypoglycemic in am 1/16 -Continue sliding scale insulin.  Gout -Not in flare -Continue colchicine   Chronic peripheral neuropathy/chronic back pain -Continue as needed judicious use of analgesics.   -Continue to hold gabapentin temporarily   Mood  disorder -Overall stable-continue Prozac -Continue holding alprazolam temporarily, encouraged to minimize extra sedative and  allow mentation to further improve.   1.6 cm left thyroid nodule -Incidental finding on CT chest -Further work-up deferred to outpatient setting.   7 mm left lower lobe nodule -Incidental finding on CT chest. -Repeat CT chest 6-12 months.   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   Morbid Obesity: -Body mass index is 36.72 kg/m. -Low-calorie diet, portion control and increase physical activity discussed with patient.  Left leg wound -Continue to follow recommendation from wound care service -No signs of acute superimposed infection appreciated.   Family Communication:  no Family at bedside  Consultants:  renal  Code Status:  FULL   DVT Prophylaxis:  Milo Heparin   Procedures: As Listed in Progress Note Above  Antibiotics: Ceftriaxone 1/15>>   Subjective: No fever, no chest pain, no nausea, no vomiting, no shortness of breath.  Reports feeling better overall.  In no acute distress and following commands appropriately.  Oriented x 3 at time of evaluation.   Objective: Vitals:   06/20/22 0946 06/20/22 1123 06/20/22 1504 06/20/22 1606  BP: (!) 154/64     Pulse: 86 86  90  Resp:  18  14  Temp:  98 F (36.7 C)  98.3 F (36.8 C)  TempSrc:  Oral  Oral  SpO2:  94% 97% 96%  Weight:      Height:        Intake/Output Summary (Last 24 hours) at 06/20/2022 1751 Last data filed at 06/20/2022 1444 Gross per 24 hour  Intake 1434.72 ml  Output 1650 ml  Net -215.28 ml   Weight change:  Exam: General exam: Alert, awake, oriented x 3 currently unable to follow commands appropriately.  Patient in no acute distress.  Denies chest pain, palpitations or shortness of breath. Respiratory system: No wheezing or crackles appreciated on exam; good saturation on chronic 3 L supplementations  at baseline.  No using accessory muscles. Cardiovascular system: Rate controlled, no rubs, no gallops, unable to properly assess JVD with body habitus. Gastrointestinal system: Abdomen is nondistended, soft and nontender. No organomegaly or masses felt. Normal bowel sounds heard. Central nervous system: Alert and oriented. No focal neurological deficits. Extremities: No cyanosis or clubbing; positive 1+ edema bilaterally; chronic stasis dermatitis changes appreciated. Skin: No petechiae.  Left leg wound appreciated; no acute drainage on exam.  Please refer to epic media for images. Psychiatry: Judgement and insight appear normal. Mood & affect appropriate.   Data Reviewed: I have personally reviewed following labs and imaging studies  Basic Metabolic Panel: Recent Labs  Lab 06/18/22 1917 06/18/22 2055 06/19/22 0555 06/20/22 0428  NA 142  --  143 143  K 3.7  --  3.9 3.7  CL 110  --  109 110  CO2 23  --  23 24  GLUCOSE 92  --  96 92  BUN 65*  --  62* 56*  CREATININE 5.02*  --  4.91* 4.57*  CALCIUM 7.9*  --  8.1* 8.2*  MG  --  2.3  --   --   PHOS  --   --   --  4.4   Liver Function Tests: Recent Labs  Lab 06/18/22 1917 06/20/22 0428  AST 9*  --   ALT 8  --   ALKPHOS 65  --   BILITOT 0.7  --   PROT 6.1*  --   ALBUMIN 2.4* 2.4*   Recent Labs  Lab 06/18/22 1917  AMMONIA <10   CBC:  Recent Labs  Lab 06/18/22 1917 06/19/22 0010 06/19/22 0555 06/19/22 1138  WBC 7.8 9.0 8.7 9.7  NEUTROABS 6.6  --   --   --   HGB 7.8* 7.9* 8.1* 8.1*  HCT 26.8* 26.9* 27.8* 28.0*  MCV 100.0 98.9 99.3 100.0  PLT 244 262 274 276   CBG: Recent Labs  Lab 06/19/22 2348 06/20/22 0351 06/20/22 0730 06/20/22 1124 06/20/22 1605  GLUCAP 106* 88 88 107* 112*   Urine analysis:    Component Value Date/Time   COLORURINE YELLOW 06/18/2022 2027   APPEARANCEUR CLOUDY (A) 06/18/2022 2027   LABSPEC 1.010 06/18/2022 2027   PHURINE 5.0 06/18/2022 2027   GLUCOSEU NEGATIVE 06/18/2022 2027    HGBUR LARGE (A) 06/18/2022 2027   BILIRUBINUR NEGATIVE 06/18/2022 2027   KETONESUR NEGATIVE 06/18/2022 2027   PROTEINUR 30 (A) 06/18/2022 2027   UROBILINOGEN 0.2 03/04/2015 1835   NITRITE NEGATIVE 06/18/2022 2027   LEUKOCYTESUR LARGE (A) 06/18/2022 2027   Sepsis Labs:  Recent Results (from the past 240 hour(s))  Resp panel by RT-PCR (RSV, Flu A&B, Covid) Anterior Nasal Swab     Status: None   Collection Time: 06/18/22  7:13 PM   Specimen: Anterior Nasal Swab  Result Value Ref Range Status   SARS Coronavirus 2 by RT PCR NEGATIVE NEGATIVE Final    Comment: (NOTE) SARS-CoV-2 target nucleic acids are NOT DETECTED.  The SARS-CoV-2 RNA is generally detectable in upper respiratory specimens during the acute phase of infection. The lowest concentration of SARS-CoV-2 viral copies this assay can detect is 138 copies/mL. A negative result does not preclude SARS-Cov-2 infection and should not be used as the sole basis for treatment or other patient management decisions. A negative result may occur with  improper specimen collection/handling, submission of specimen other than nasopharyngeal swab, presence of viral mutation(s) within the areas targeted by this assay, and inadequate number of viral copies(<138 copies/mL). A negative result must be combined with clinical observations, patient history, and epidemiological information. The expected result is Negative.  Fact Sheet for Patients:  EntrepreneurPulse.com.au  Fact Sheet for Healthcare Providers:  IncredibleEmployment.be  This test is no t yet approved or cleared by the [place] FDA and  has been authorized for detection and/or diagnosis of SARS-CoV-2 by FDA under an Emergency Use Authorization (EUA). This EUA will remain  in effect (meaning this test can be used) for the duration of the COVID-19 declaration under Section 564(b)(1) of the Act, 21 U.S.C.section 360bbb-3(b)(1), unless the  authorization is terminated  or revoked sooner.       Influenza A by PCR NEGATIVE NEGATIVE Final   Influenza B by PCR NEGATIVE NEGATIVE Final    Comment: (NOTE) The Xpert Xpress SARS-CoV-2/FLU/RSV plus assay is intended as an aid in the diagnosis of influenza from Nasopharyngeal swab specimens and should not be used as a sole basis for treatment. Nasal washings and aspirates are unacceptable for Xpert Xpress SARS-CoV-2/FLU/RSV testing.  Fact Sheet for Patients: EntrepreneurPulse.com.au  Fact Sheet for Healthcare Providers: IncredibleEmployment.be  This test is not yet approved or cleared by the [place] FDA and has been authorized for detection and/or diagnosis of SARS-CoV-2 by FDA under an Emergency Use Authorization (EUA). This EUA will remain in effect (meaning this test can be used) for the duration of the COVID-19 declaration under Section 564(b)(1) of the Act, 21 U.S.C. section 360bbb-3(b)(1), unless the authorization is terminated or revoked.     Resp Syncytial Virus by PCR NEGATIVE NEGATIVE Final  Comment: (NOTE) Fact Sheet for Patients: EntrepreneurPulse.com.au  Fact Sheet for Healthcare Providers: IncredibleEmployment.be  This test is not yet approved or cleared by the [place] FDA and has been authorized for detection and/or diagnosis of SARS-CoV-2 by FDA under an Emergency Use Authorization (EUA). This EUA will remain in effect (meaning this test can be used) for the duration of the COVID-19 declaration under Section 564(b)(1) of the Act, 21 U.S.C. section 360bbb-3(b)(1), unless the authorization is terminated or revoked.  Performed at [facility], [address], [place], [place] [address]   Urine Culture     Status: Abnormal   Collection Time: 06/18/22  8:27 PM   Specimen: Urine, Clean Catch  Result Value Ref Range Status   Specimen Description   Final    URINE, CLEAN  CATCH Performed at [facility], [address], [place], [place] [address]    Special Requests   Final    NONE Performed at [facility], [address] [place], [place] [address]    Culture MULTIPLE SPECIES PRESENT, SUGGEST RECOLLECTION (A)  Final   Report Status 06/20/2022 FINAL  Final  MRSA Next Gen by PCR, Nasal     Status: Abnormal   Collection Time: 06/19/22  1:42 PM   Specimen: Nasal Mucosa; Nasal Swab  Result Value Ref Range Status   MRSA by PCR Next Gen DETECTED (A) NOT DETECTED Final    Comment: RESULT CALLED TO, READ BACK BY AND VERIFIED WITH: JENNIFER KING @ [phone] ON 06/20/22 C VARNER (NOTE) The GeneXpert MRSA Assay (FDA approved for NASAL specimens only), is one component of a comprehensive MRSA colonization surveillance program. It is not intended to diagnose MRSA infection nor to guide or monitor treatment for MRSA infections. Test performance is not FDA approved in patients less than 35 years old. Performed at [facility], [address] [place], [place] [address]      Scheduled Meds:  amiodarone  200 mg Oral Daily   calcitRIOL  0.25 mcg Oral Daily   Chlorhexidine Gluconate Cloth  6 each Topical Daily   darbepoetin (ARANESP) injection - NON-DIALYSIS  300 mcg Subcutaneous Q Tue-1800   FLUoxetine  40 mg Oral Daily   fluticasone furoate-vilanterol  1 puff Inhalation Daily   insulin aspart  0-9 Units Subcutaneous Q4H   metoprolol tartrate  25 mg Oral BID   mupirocin ointment   Nasal BID   nystatin  5 mL Oral QID   [START ON 06/22/2022] pantoprazole  40 mg Intravenous Q12H   torsemide  60 mg Oral Daily   umeclidinium bromide  1 puff Inhalation Daily   Continuous Infusions:  sodium chloride 20 mL/hr at 06/20/22 1637   cefTRIAXone (ROCEPHIN)  IV     dextrose 5 % and 0.9% NaCl Stopped (06/20/22 1610)   pantoprazole Stopped (06/19/22 3790)    Procedures/Studies: DG Chest 1 View  Result Date: 06/18/2022 CLINICAL DATA:  Altered mental status. EXAM: CHEST  1 VIEW  COMPARISON:  Chest radiograph 04/12/2022, CT chest 04/12/2022 FINDINGS: The heart is enlarged.  The upper mediastinal contours are normal. Previously seen opacity in the left upper lobe has essentially resolved, with mild residual patchy opacity which may reflect residual scarring. There is no new or worsening focal airspace disease. There are diffusely increased interstitial markings possibly reflecting mild pulmonary interstitial edema. There is no significant pleural effusion. There is no pneumothorax. Note that the smaller nodular opacity seen on the prior chest CT are not seen on the current study. There is no  acute osseous abnormality. IMPRESSION: 1. Cardiomegaly with possible mild pulmonary interstitial edema. 2. Essentially resolved airspace opacity in the left upper lobe with probable residual scarring. 3. Additional smaller nodules seen on the prior chest CT are not seen on the current study. Please refer to that study for follow-up recommendations. Electronically Signed   By: Valetta Mole M.D.   On: 06/18/2022 20:09   DG Tibia/Fibula Left  Result Date: 06/18/2022 CLINICAL DATA:  Left lower extremity erythema EXAM: LEFT TIBIA AND FIBULA - 2 VIEW COMPARISON:  03/22/2022 FINDINGS: Frontal and lateral views of the left tibia and fibula are obtained. There is a residual screw traversing the medial malleolus. K-wire within the medial malleolus and the plate and screw fixation across the distal fibula have been removed in the interim. New heterotopic ossification along the dorsal margin of the distal fibula. There are no acute displaced fractures. The bones are diffusely osteopenic. No evidence of bony destruction or periosteal reaction to suggest osteomyelitis. There is diffuse subcutaneous edema. IMPRESSION: 1. Diffuse subcutaneous edema consistent with cellulitis. 2. No radiographic evidence of osteomyelitis. 3. Diffuse osteopenia. 4. Revision of orthopedic hardware about the left ankle as above.  Electronically Signed   By: Randa Ngo M.D.   On: 06/18/2022 20:06   CT Head Wo Contrast  Result Date: 06/18/2022 CLINICAL DATA:  Altered mental status EXAM: CT HEAD WITHOUT CONTRAST TECHNIQUE: Contiguous axial images were obtained from the base of the skull through the vertex without intravenous contrast. RADIATION DOSE REDUCTION: This exam was performed according to the departmental dose-optimization program which includes automated exposure control, adjustment of the mA and/or kV according to patient size and/or use of iterative reconstruction technique. COMPARISON:  05/17/2019 FINDINGS: Brain: No mass, hemorrhage or extra-axial collection. There is periventricular hypoattenuation compatible with chronic microvascular disease. 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 Generalized volume loss. Vascular: Atherosclerotic calcification of the internal carotid and vertebral arteries at skull base. Skull: Normal. Negative for fracture or focal lesion. Sinuses/Orbits: No acute finding. Other: None. IMPRESSION: 1. No acute intracranial abnormality. 2. 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 Electronically Signed   By: Ulyses Jarred M.D.   On: 06/18/2022 19:50    Barton Dubois, MD  [facility]  If 7PM-7AM, please contact night-coverage [email or web address] Password [email or web address] 06/20/2022, 5:51 PM   LOS: 2 days

## 2022-06-20 NOTE — Progress Notes (Signed)
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 Or Actions Taken?:  Orders placed for Bactroban ointment BID

## 2022-06-21 ENCOUNTER — Inpatient Hospital Stay (HOSPITAL_COMMUNITY): Payer: Medicare (Managed Care) | Admitting: Certified Registered Nurse Anesthetist

## 2022-06-21 ENCOUNTER — Encounter (HOSPITAL_COMMUNITY): Payer: Self-pay | Admitting: Internal Medicine

## 2022-06-21 ENCOUNTER — Encounter (HOSPITAL_COMMUNITY)
Admission: EM | Disposition: A | Discharge status: Skilled Nursing Facility | Payer: Self-pay | Source: Skilled Nursing Facility | Attending: Internal Medicine

## 2022-06-21 ENCOUNTER — Inpatient Hospital Stay (HOSPITAL_COMMUNITY): Payer: Medicare (Managed Care)

## 2022-06-21 ENCOUNTER — Other Ambulatory Visit: Payer: Self-pay

## 2022-06-21 DIAGNOSIS — Z794 Long term (current) use of insulin: Secondary | ICD-10-CM

## 2022-06-21 DIAGNOSIS — K2970 Gastritis, unspecified, without bleeding: Secondary | ICD-10-CM

## 2022-06-21 DIAGNOSIS — K2980 Duodenitis without bleeding: Secondary | ICD-10-CM

## 2022-06-21 DIAGNOSIS — F418 Other specified anxiety disorders: Secondary | ICD-10-CM

## 2022-06-21 DIAGNOSIS — K2990 Gastroduodenitis, unspecified, without bleeding: Secondary | ICD-10-CM | POA: Diagnosis not present

## 2022-06-21 DIAGNOSIS — E1151 Type 2 diabetes mellitus with diabetic peripheral angiopathy without gangrene: Secondary | ICD-10-CM

## 2022-06-21 DIAGNOSIS — G9341 Metabolic encephalopathy: Secondary | ICD-10-CM | POA: Diagnosis not present

## 2022-06-21 DIAGNOSIS — D62 Acute posthemorrhagic anemia: Secondary | ICD-10-CM

## 2022-06-21 DIAGNOSIS — K921 Melena: Secondary | ICD-10-CM | POA: Diagnosis not present

## 2022-06-21 DIAGNOSIS — E1122 Type 2 diabetes mellitus with diabetic chronic kidney disease: Secondary | ICD-10-CM | POA: Diagnosis not present

## 2022-06-21 DIAGNOSIS — I4892 Unspecified atrial flutter: Secondary | ICD-10-CM | POA: Diagnosis not present

## 2022-06-21 HISTORY — PX: ESOPHAGOGASTRODUODENOSCOPY (EGD) WITH PROPOFOL: SHX5813

## 2022-06-21 LAB — CBC
HCT: 28.6 % — ABNORMAL LOW (ref 39.0–52.0)
Hemoglobin: 8.5 g/dL — ABNORMAL LOW (ref 13.0–17.0)
MCH: 29.2 pg (ref 26.0–34.0)
MCHC: 29.7 g/dL — ABNORMAL LOW (ref 30.0–36.0)
MCV: 98.3 fL (ref 80.0–100.0)
Platelets: 240 10*3/uL (ref 150–400)
RBC: 2.91 MIL/uL — ABNORMAL LOW (ref 4.22–5.81)
RDW: 16.8 % — ABNORMAL HIGH (ref 11.5–15.5)
WBC: 10.8 10*3/uL — ABNORMAL HIGH (ref 4.0–10.5)
nRBC: 0 % (ref 0.0–0.2)

## 2022-06-21 LAB — GLUCOSE, CAPILLARY
Glucose-Capillary: 113 mg/dL — ABNORMAL HIGH (ref 70–99)
Glucose-Capillary: 115 mg/dL — ABNORMAL HIGH (ref 70–99)
Glucose-Capillary: 125 mg/dL — ABNORMAL HIGH (ref 70–99)
Glucose-Capillary: 127 mg/dL — ABNORMAL HIGH (ref 70–99)
Glucose-Capillary: 128 mg/dL — ABNORMAL HIGH (ref 70–99)
Glucose-Capillary: 140 mg/dL — ABNORMAL HIGH (ref 70–99)
Glucose-Capillary: 188 mg/dL — ABNORMAL HIGH (ref 70–99)

## 2022-06-21 LAB — RENAL FUNCTION PANEL
Albumin: 2.4 g/dL — ABNORMAL LOW (ref 3.5–5.0)
Anion gap: 9 (ref 5–15)
BUN: 50 mg/dL — ABNORMAL HIGH (ref 8–23)
CO2: 25 mmol/L (ref 22–32)
Calcium: 8.4 mg/dL — ABNORMAL LOW (ref 8.9–10.3)
Chloride: 111 mmol/L (ref 98–111)
Creatinine, Ser: 4.42 mg/dL — ABNORMAL HIGH (ref 0.61–1.24)
GFR, Estimated: 13 mL/min — ABNORMAL LOW (ref >60)
Glucose, Bld: 101 mg/dL — ABNORMAL HIGH (ref 70–99)
Phosphorus: 4.2 mg/dL (ref 2.5–4.6)
Potassium: 3.8 mmol/L (ref 3.5–5.1)
Sodium: 145 mmol/L (ref 135–145)

## 2022-06-21 LAB — BLOOD GAS, ARTERIAL
Acid-Base Excess: 1.5 mmol/L (ref 0.0–2.0)
Bicarbonate: 25.9 mmol/L (ref 20.0–28.0)
Drawn by: 21338
FIO2: 48 %
O2 Saturation: 92.9 %
Patient temperature: 37.3
pCO2 arterial: 39 mmHg (ref 32–48)
pH, Arterial: 7.43 (ref 7.35–7.45)
pO2, Arterial: 63 mmHg — ABNORMAL LOW (ref 83–108)

## 2022-06-21 SURGERY — ESOPHAGOGASTRODUODENOSCOPY (EGD) WITH PROPOFOL
Anesthesia: General

## 2022-06-21 MED ORDER — PROPOFOL 10 MG/ML IV BOLUS: INTRAVENOUS | Status: DC | PRN

## 2022-06-21 MED ORDER — BISACODYL 5 MG PO TBEC
10.0000 mg | DELAYED_RELEASE_TABLET | Freq: Once | ORAL | Status: AC
  Administered 2022-06-21: 10 mg via ORAL
  Filled 2022-06-21: qty 2

## 2022-06-21 MED ORDER — LACTATED RINGERS IV SOLN: INTRAVENOUS | Status: DC | PRN

## 2022-06-21 MED ORDER — PROPOFOL 10 MG/ML IV BOLUS
INTRAVENOUS | Status: DC | PRN
  Administered 2022-06-21: 30 mg via INTRAVENOUS
  Administered 2022-06-21: 175 ug/kg/min via INTRAVENOUS

## 2022-06-21 MED ORDER — PEG 3350-KCL-NABCB-NACL-NASULF 236 G PO SOLR
4000.0000 mL | Freq: Once | ORAL | Status: DC
  Filled 2022-06-21: qty 4000

## 2022-06-21 MED ORDER — LIDOCAINE HCL (CARDIAC) PF 100 MG/5ML IV SOSY
PREFILLED_SYRINGE | INTRAVENOUS | Status: DC | PRN
  Administered 2022-06-21: 30 mg via INTRAVENOUS

## 2022-06-21 MED ORDER — PEG 3350-KCL-NA BICARB-NACL 420 G PO SOLR: 4000.0000 mL | Freq: Once | ORAL | Status: DC

## 2022-06-21 NOTE — Interval H&P Note (Signed)
History and Physical Interval Note:  06/21/2022 10:51 AM  Eric Scott  has presented today for surgery, with the diagnosis of melena.  The various methods of treatment have been discussed with the patient and family. After consideration of risks, benefits and other options for treatment, the patient has consented to  Procedure(s): ESOPHAGOGASTRODUODENOSCOPY (EGD) WITH PROPOFOL (N/A) as a surgical intervention.  The patient's history has been reviewed, patient examined, no change in status, stable for surgery.  I have reviewed the patient's chart and labs.  Questions were answered to the patient's satisfaction.     [name]

## 2022-06-21 NOTE — Anesthesia Postprocedure Evaluation (Signed)
Anesthesia Post Note  Patient: Eric Scott  Procedure(s) Performed: ESOPHAGOGASTRODUODENOSCOPY (EGD) WITH PROPOFOL  Patient location during evaluation: Endoscopy Anesthesia Type: General Level of consciousness: awake and alert Pain management: pain level controlled Vital Signs Assessment: post-procedure vital signs reviewed and stable Respiratory status: spontaneous breathing, nonlabored ventilation, respiratory function stable and patient connected to nasal cannula oxygen Cardiovascular status: blood pressure returned to baseline and stable Postop Assessment: no apparent nausea or vomiting Anesthetic complications: no   There were no known notable events for this encounter.   Last Vitals:  Vitals:   06/21/22 1239 06/21/22 1300  BP: (!) 168/81 (!) 168/87  Pulse: 90 89  Resp: 16 16  Temp: (!) 36.4 C 37.2 C  SpO2: 100% 99%    Last Pain:  Vitals:   06/21/22 1300  TempSrc: Oral  PainSc:                  [name]

## 2022-06-21 NOTE — Op Note (Addendum)
[facility] Patient Name: Eric Scott Procedure Date: 06/21/2022 11:17 AM MRN: [id number] Date of Birth: 07-23-1950 Attending MD: Elon Alas. Abbey Chatters , [place], [id number] CSN: [id number] Age: 72 Admit Type: Outpatient Procedure:                Upper GI endoscopy Indications:              Acute post hemorrhagic anemia, Melena Providers:                Elon Alas. Abbey Chatters, DO, Gwynneth Albright RN, RN,                            Illene Labrador Referring MD:              Medicines:                See the Anesthesia note for documentation of the                            administered medications Complications:            No immediate complications. Estimated Blood Loss:     Estimated blood loss: none. Procedure:                Pre-Anesthesia Assessment:                           - The anesthesia plan was to use monitored                            anesthesia care (MAC).                           After obtaining informed consent, the endoscope was                            passed under direct vision. Throughout the                            procedure, the patient's blood pressure, pulse, and                            oxygen saturations were monitored continuously. The                            [id number]-[id number] ([id number]) scope was introduced through the                            mouth, and advanced to the second part of duodenum.                            The upper GI endoscopy was accomplished without                            difficulty. The patient tolerated the procedure                            well. Scope  In: 11:52:14 AM Scope Out: 11:55:29 AM Total Procedure Duration: 0 hours 3 minutes 15 seconds  Findings:      There is no endoscopic evidence of bleeding, areas of erosion,       esophagitis, ulcerations or varices in the entire esophagus.      Patchy mild inflammation characterized by erythema was found in the       gastric body and in the gastric antrum.      Localized  mild inflammation characterized by erythema was found in the       duodenal bulb. Impression:               - Gastritis.                           - Duodenitis.                           - No specimens collected. Moderate Sedation:      Per Anesthesia Care Recommendation:           - Return patient to hospital ward for ongoing care.                            Continue on BID PPI. Given Maroon stool seen in ER                            as well as chronic Eliquis, we will try to prep                            tonight and pursue colonoscopy in the AM. Procedure Code(s):        --- Professional ---                           [phone], Esophagogastroduodenoscopy, flexible,                            transoral; diagnostic, including collection of                            specimen(s) by brushing or washing, when performed                            (separate procedure) Diagnosis Code(s):        --- Professional ---                           K29.70, Gastritis, unspecified, without bleeding                           K29.80, Duodenitis without bleeding                           D62, Acute posthemorrhagic anemia                           K92.1, Melena (includes Hematochezia) CPT copyright 2022 American Medical Association. All rights reserved. The codes documented in this report are preliminary and upon  coder review may  be revised to meet current compliance requirements. Elon Alas. Abbey Chatters, DO [place] Abbey Chatters, DO 06/21/2022 12:02:28 PM This report has been signed electronically. Number of Addenda: 0

## 2022-06-21 NOTE — Progress Notes (Signed)
Surgeon said no food but gave BP/HR medication for elevated BP hospitalist aware, O2 sats 99/100% on 5L but apparently PACCU nurse [place] stated he did not tolerated well in surgery O2 sats went briefly in the 40's. Pale, tremors-baseline but alert and oriented. Holding all other food and medicine.

## 2022-06-21 NOTE — Transfer of Care (Signed)
Immediate Anesthesia Transfer of Care Note  Patient: Eric Scott  Procedure(s) Performed: ESOPHAGOGASTRODUODENOSCOPY (EGD) WITH PROPOFOL  Patient Location: PACU  Anesthesia Type:General  Level of Consciousness: awake  Airway & Oxygen Therapy: Patient Spontanous Breathing and Patient connected to face mask oxygen  Post-op Assessment: Report given to RN and Post -op Vital signs reviewed and stable  Post vital signs: Reviewed and stable  Last Vitals:  Vitals Value Taken Time  BP    Temp    Pulse 82 06/21/22 1204  Resp 10 06/21/22 1204  SpO2 100 % 06/21/22 1204  Vitals shown include unvalidated device data.  Last Pain:  Vitals:   06/21/22 1145  TempSrc:   PainSc: 8       Patients Stated Pain Goal: 5 (32/91/91 6606)  Complications: No notable events documented.

## 2022-06-21 NOTE — Progress Notes (Signed)
Went in to give 1U insulin and patient c/o difficulty breathing sat 88% turned up to 10. Called RT giving PRN treatment and put on Tele continuous monitoring to watch sats do not go below 92%

## 2022-06-21 NOTE — Progress Notes (Signed)
[facility] Progress Note    Assessment/ Plan:   AKI on CKD4 -follows with Dr. Theador Hawthorne. B/L Cr in the 3's -AKI likely related to UTI, hypoglycemia. Peak Cr 5 on 1/15. Cr down to 4.4 today, c/w supportive care/fluids/abx. UOP also seems to be improving now -no indication for RRT at this time. If it were to come to that, would recommend palliative consultation to address [place] as he is not an ideal candidate for [place] term dialysis in the context of co-morbidities and functional status and HD will likely adversely affect his QOL -Avoid nephrotoxic medications including NSAIDs and iodinated intravenous contrast exposure unless the latter is absolutely indicated.  Preferred narcotic agents for pain control are hydromorphone, fentanyl, and methadone. Morphine should not be used. Avoid Baclofen and avoid oral sodium phosphate and magnesium citrate based laxatives / bowel preps. Continue strict Input and Output monitoring. Will monitor the patient closely with you and intervene or adjust therapy as indicated by changes in clinical status/labs   Acute on chronic metabolic encephalopathy -likely multifactorial: UTI, hypoglycemia, AKI, polypharmacy. Mental status is stable for me as compared to yesterday. Per primary service  UTI -on rocephin  Chronic anemia, melena/FOBT positive -GI consulted, EGD tentatively planned for today -ESA started on 1/16. 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 PO4 WNL  H/o Hyperkalemia -on lokelma as an outpatient, can hold here given K WNL  Subjective:   Patient seen and examined bedside. Out of ICU now. No acute events overnight, no complaints   Objective:   BP (!) 115/54   Pulse 88   Temp 98 F (36.7 C)   Resp 20   Ht 6' (1.829 m)   Wt 122.8 kg   SpO2 93%   BMI 36.72 kg/m   Intake/Output Summary (Last 24 hours) at 06/21/2022 0904 Last data filed at 06/21/2022 0738 Gross per 24 hour   Intake 1052.22 ml  Output 1800 ml  Net -747.78 ml   Weight change:   Physical Exam: Gen:NAD, chronically ill appearing CVS: RRR Resp: poor air exchange diffusely, normal WOB, bl chest expansion Abd: soft, nt/nd Ext: woody edema b/l LE, LLE dressing in place Neuro: awake, tremulous  Imaging: No results found.  Labs: BMET Recent Labs  Lab 06/18/22 1917 06/19/22 0555 06/20/22 0428 06/21/22 0346  NA 142 143 143 145  K 3.7 3.9 3.7 3.8  CL 110 109 110 111  CO2 '23 23 24 25  '$ GLUCOSE 92 96 92 101*  BUN 65* 62* 56* 50*  CREATININE 5.02* 4.91* 4.57* 4.42*  CALCIUM 7.9* 8.1* 8.2* 8.4*  PHOS  --   --  4.4 4.2   CBC Recent Labs  Lab 06/18/22 1917 06/19/22 0010 06/19/22 0555 06/19/22 1138  WBC 7.8 9.0 8.7 9.7  NEUTROABS 6.6  --   --   --   HGB 7.8* 7.9* 8.1* 8.1*  HCT 26.8* 26.9* 27.8* 28.0*  MCV 100.0 98.9 99.3 100.0  PLT 244 262 274 276    Medications:     amiodarone  200 mg Oral Daily   calcitRIOL  0.25 mcg Oral Daily   Chlorhexidine Gluconate Cloth  6 each Topical Daily   darbepoetin (ARANESP) injection - NON-DIALYSIS  300 mcg Subcutaneous Q Tue-1800   FLUoxetine  40 mg Oral Daily   fluticasone furoate-vilanterol  1 puff Inhalation Daily   insulin aspart  0-9 Units Subcutaneous Q4H   metoprolol tartrate  25 mg Oral BID   mupirocin  ointment   Nasal BID   nystatin  5 mL Oral QID   [START ON 06/22/2022] pantoprazole  40 mg Intravenous Q12H   torsemide  60 mg Oral Daily   umeclidinium bromide  1 puff Inhalation Daily      Gean Quint, MD [facility] 06/21/2022, 9:04 AM

## 2022-06-21 NOTE — Progress Notes (Signed)
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  The patient was recently hospitalized from 04/12/2022 to 04/20/2022 secondary to acute on chronic respiratory failure due to COPD exacerbation, pneumonia and acute on chronic HFpEF.  During that hospitalization, the patient also had acute on chronic renal failure.  He was felt to be hypervolemic and started on torsemide.   Per triage notes and ED provider who talked to staff at facility, at about 5 PM staff noticed that patient was confused.  Reported his blood sugars ranging in the 400s subsequently dropped to 68>> 130.  It is unknown if insulin was given when his blood sugars were in the 400s, a different staff had taken over patient's care.  Glucagon was given.  He has also had chronic left lower extremity pain which is unchanged. He also got his Lantus on the day of admission. In the ED, the patient was afebrile hemodynamically stable with oxygen saturation 92-95% on 4 L.  WBC 8.7, hemoglobin 8.1, platelets 274,000.  Sodium 143, potassium 3.9, bicarbonate 23, serum creatinine 5.02.   0.4 mg IV Narcan given, with some initial improvement in mental status, subsequent 0.8 mg Narcan given without significant response.  Patient was admitted for altered mental status and acute on chronic renal failure.   Assessment/Plan: Acute on chronic metabolic encephalopathy -Multifactorial including hypoglycemia, UTI, AKI, polypharmacy in the setting of impaired renal function -06/18/2022 ABG 7.36/47/74/26 on  2-3 L -06/19/2022--mental status slightly improved but not at baseline -Mentation overall improving and close to baseline. -CT brain negative -Continue IV fluid resuscitation and IV antibiotics -Continue holding gabapentin -Continue constant reorientation.  UTI -UA>50 WBC -Continue ceftriaxone therapy -Cultures not helpful given multiple species isolated -Will treat empirically for 5 days.  Acute on chronic renal failure--CKD stage IV -Baseline creatinine 3.3-3.7 -Presented with serum creatinine 5.02 -Nephrology consult appreciated -Creatinine trending down with fluid resuscitation and supportive care -Continue to minimize/avoid the use of nephrotoxic agents and follow creatinine. -No requiring hemodialysis at this point. -Creatinine 4.4 today.  Melena/FOBT positive -GI consultation appreciated. -holding apixaban temporarily -Continue NPO status. -had melena in ED at time of admission.  No further overt bleeding appreciated. -Continue PPI. -GI planning for endoscopy later this morning.  Paroxysmal atrial flutter -In and out of A-fib/flutter -Continue telemetry monitoring -Continue the use of amiodarone and metoprolol -Rate appears to be stable and well-controlled currently -Holding anticoagulation in the setting of melanotic stools and positive fecal occult blood test. -Plan for endoscopy later today. -Expressing to be hungry.  No overt bleeding appreciated.  Hemoglobin level stable.  Polymicrobial left ankle osteomyelitis- -s/p debridement/hardware removal by Dr. Sharol Given on 10/25  -finished 6 weeks abx on 05/09/22 -finished linezolid 11/25-12/6 -Continue wound care service recommendation.  Chronic respiratory failure with hypoxia and hypercarbia/COPD -on 3-4L at baseline -continue BDs as needed. -No wheezing or signs of acute exacerbation currently. -Stable breathing.  Controlled DM2  -03/28/22 A1C--5.8 -hypoglycemic in am 1/16 -Continue sliding scale  insulin. -Close monitoring of patient's CBGs especially while n.p.o. for procedure.  Gout -Not in flare -Continue colchicine   Chronic peripheral neuropathy/chronic  back pain -Continue as needed judicious use of analgesics.   -Continue to hold gabapentin temporarily   Mood disorder -Overall stable-continue Prozac -Continue holding alprazolam temporarily, encouraged to minimize extra sedative and continue constant reorientation.   1.6 cm left thyroid nodule -Incidental finding on CT chest -Further work-up deferred to outpatient setting.   7 mm left lower lobe nodule -Incidental finding on CT chest. -Repeat CT chest 6-12 months.   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   Morbid Obesity: -Body mass index is 36.48 kg/m. -Low-calorie diet, portion control and increase physical activity discussed with patient.  Left leg wound -Continue to follow recommendation from wound care service -No signs of acute superimposed infection appreciated.   Family Communication:  no Family at bedside  Consultants:  renal  Code Status:  FULL   DVT Prophylaxis: SCDs.  Patient chronically on Eliquis; on hold in the setting of GI bleed.   Procedures: As Listed in Progress Note Above  Antibiotics: Ceftriaxone 1/15>>   Subjective: Fever, no chest pain, no nausea or vomiting.  No overt bleeding appreciated.  Following commands appropriately and in no acute distress.  Expressed to be hungry.   Objective: Vitals:   06/21/22 1239 06/21/22 1300 06/21/22 1712 06/21/22 1717  BP: (!) 168/81 (!) 168/87    Pulse: 90 89  89  Resp: '16 16  20  '$ Temp: (!) 97.5 F (36.4 C) 98.9 F (37.2 C)  99.1 F (37.3 C)  TempSrc: Oral Oral  Axillary  SpO2: 100% 99% (!) 88% 98%  Weight:      Height:        Intake/Output Summary (Last 24 hours) at 06/21/2022 1721 Last data filed at 06/21/2022 1540 Gross per 24 hour  Intake 1246.59 ml   Output 1000 ml  Net 246.59 ml   Weight change:  Exam: General exam: Alert, awake, and commands appropriately and in no acute distress.  Hungry and waiting for GI evaluation. Respiratory system: Good saturation on 3 L supplementation; reports no worsening breathing at this time.  No wheezing or crackles appreciated.  Positive rhonchi.  No using accessory muscles. Cardiovascular system: Rate controlled. No rubs or gallops; no JVD. Gastrointestinal system: Abdomen is nondistended, soft and nontender. No organomegaly or masses felt. Normal bowel sounds heard. Central nervous system: Alert and oriented. No focal neurological deficits. Extremities/skin: No cyanosis or clubbing; chronic stasis dermatitis and chronic left lower extremity wound appreciated on exam.  Clean dressings in place.  1+ edema appreciated bilaterally. Psychiatry: Judgement and insight appear normal. Mood & affect appropriate.   Data Reviewed: I have personally reviewed following labs and imaging studies  Basic Metabolic Panel: Recent Labs  Lab 06/18/22 1917 06/18/22 2055 06/19/22 0555 06/20/22 0428 06/21/22 0346  NA 142  --  143 143 145  K 3.7  --  3.9 3.7 3.8  CL 110  --  109 110 111  CO2 23  --  '23 24 25  '$ GLUCOSE 92  --  96 92 101*  BUN 65*  --  62* 56* 50*  CREATININE 5.02*  --  4.91* 4.57* 4.42*  CALCIUM 7.9*  --  8.1* 8.2* 8.4*  MG  --  2.3  --   --   --   PHOS  --   --   --  4.4 4.2   Liver Function Tests: Recent Labs  Lab 06/18/22 1917 06/20/22 0428 06/21/22 0346  AST 9*  --   --  ALT 8  --   --   ALKPHOS 65  --   --   BILITOT 0.7  --   --   PROT 6.1*  --   --   ALBUMIN 2.4* 2.4* 2.4*   Recent Labs  Lab 06/18/22 1917  AMMONIA <10   CBC: Recent Labs  Lab 06/18/22 1917 06/19/22 0010 06/19/22 0555 06/19/22 1138 06/21/22 1113  WBC 7.8 9.0 8.7 9.7 10.8*  NEUTROABS 6.6  --   --   --   --   HGB 7.8* 7.9* 8.1* 8.1* 8.5*  HCT 26.8* 26.9* 27.8* 28.0* 28.6*  MCV 100.0 98.9 99.3 100.0  98.3  PLT 244 262 274 276 240   CBG: Recent Labs  Lab 06/21/22 0011 06/21/22 0500 06/21/22 0735 06/21/22 1127 06/21/22 1642  GLUCAP 127* 125* 113* 115* 140*   Urine analysis:    Component Value Date/Time   COLORURINE YELLOW 06/18/2022 2027   APPEARANCEUR CLOUDY (A) 06/18/2022 2027   LABSPEC 1.010 06/18/2022 2027   PHURINE 5.0 06/18/2022 2027   GLUCOSEU NEGATIVE 06/18/2022 2027   HGBUR LARGE (A) 06/18/2022 2027   BILIRUBINUR NEGATIVE 06/18/2022 2027   KETONESUR NEGATIVE 06/18/2022 2027   PROTEINUR 30 (A) 06/18/2022 2027   UROBILINOGEN 0.2 03/04/2015 1835   NITRITE NEGATIVE 06/18/2022 2027   LEUKOCYTESUR LARGE (A) 06/18/2022 2027   Sepsis Labs:  Recent Results (from the past 240 hour(s))  Resp panel by RT-PCR (RSV, Flu A&B, Covid) Anterior Nasal Swab     Status: None   Collection Time: 06/18/22  7:13 PM   Specimen: Anterior Nasal Swab  Result Value Ref Range Status   SARS Coronavirus 2 by RT PCR NEGATIVE NEGATIVE Final    Comment: (NOTE) SARS-CoV-2 target nucleic acids are NOT DETECTED.  The SARS-CoV-2 RNA is generally detectable in upper respiratory specimens during the acute phase of infection. The lowest concentration of SARS-CoV-2 viral copies this assay can detect is 138 copies/mL. A negative result does not preclude SARS-Cov-2 infection and should not be used as the sole basis for treatment or other patient management decisions. A negative result may occur with  improper specimen collection/handling, submission of specimen other than nasopharyngeal swab, presence of viral mutation(s) within the areas targeted by this assay, and inadequate number of viral copies(<138 copies/mL). A negative result must be combined with clinical observations, patient history, and epidemiological information. The expected result is Negative.  Fact Sheet for Patients:  EntrepreneurPulse.com.au  Fact Sheet for Healthcare Providers:   IncredibleEmployment.be  This test is no t yet approved or cleared by the [place] FDA and  has been authorized for detection and/or diagnosis of SARS-CoV-2 by FDA under an Emergency Use Authorization (EUA). This EUA will remain  in effect (meaning this test can be used) for the duration of the COVID-19 declaration under Section 564(b)(1) of the Act, 21 U.S.C.section 360bbb-3(b)(1), unless the authorization is terminated  or revoked sooner.       Influenza A by PCR NEGATIVE NEGATIVE Final   Influenza B by PCR NEGATIVE NEGATIVE Final    Comment: (NOTE) The Xpert Xpress SARS-CoV-2/FLU/RSV plus assay is intended as an aid in the diagnosis of influenza from Nasopharyngeal swab specimens and should not be used as a sole basis for treatment. Nasal washings and aspirates are unacceptable for Xpert Xpress SARS-CoV-2/FLU/RSV testing.  Fact Sheet for Patients: EntrepreneurPulse.com.au  Fact Sheet for Healthcare Providers: IncredibleEmployment.be  This test is not yet approved or cleared by the [place] and has been authorized for  detection and/or diagnosis of SARS-CoV-2 by FDA under an Emergency Use Authorization (EUA). This EUA will remain in effect (meaning this test can be used) for the duration of the COVID-19 declaration under Section 564(b)(1) of the Act, 21 U.S.C. section 360bbb-3(b)(1), unless the authorization is terminated or revoked.     Resp Syncytial Virus by PCR NEGATIVE NEGATIVE Final    Comment: (NOTE) Fact Sheet for Patients: EntrepreneurPulse.com.au  Fact Sheet for Healthcare Providers: IncredibleEmployment.be  This test is not yet approved or cleared by the [place] FDA and has been authorized for detection and/or diagnosis of SARS-CoV-2 by FDA under an Emergency Use Authorization (EUA). This EUA will remain in effect (meaning this test can be used) for  the duration of the COVID-19 declaration under Section 564(b)(1) of the Act, 21 U.S.C. section 360bbb-3(b)(1), unless the authorization is terminated or revoked.  Performed at [facility], [address], [place], [place] [address]   Urine Culture     Status: Abnormal   Collection Time: 06/18/22  8:27 PM   Specimen: Urine, Clean Catch  Result Value Ref Range Status   Specimen Description   Final    URINE, CLEAN CATCH Performed at [facility], [address] [place], [place] [address]    Special Requests   Final    NONE Performed at [facility], [address] [place], [place] [address]    Culture MULTIPLE SPECIES PRESENT, SUGGEST RECOLLECTION (A)  Final   Report Status 06/20/2022 FINAL  Final  MRSA Next Gen by PCR, Nasal     Status: Abnormal   Collection Time: 06/19/22  1:42 PM   Specimen: Nasal Mucosa; Nasal Swab  Result Value Ref Range Status   MRSA by PCR Next Gen DETECTED (A) NOT DETECTED Final    Comment: RESULT CALLED TO, READ BACK BY AND VERIFIED WITH: JENNIFER KING @ [phone] ON 06/20/22 C VARNER (NOTE) The GeneXpert MRSA Assay (FDA approved for NASAL specimens only), is one component of a comprehensive MRSA colonization surveillance program. It is not intended to diagnose MRSA infection nor to guide or monitor treatment for MRSA infections. Test performance is not FDA approved in patients less than 46 years old. Performed at [facility], [address] [place], [place] [address]      Scheduled Meds:  amiodarone  200 mg Oral Daily   bisacodyl  10 mg Oral Once   calcitRIOL  0.25 mcg Oral Daily   Chlorhexidine Gluconate Cloth  6 each Topical Daily   darbepoetin (ARANESP) injection - NON-DIALYSIS  300 mcg Subcutaneous Q Tue-1800   FLUoxetine  40 mg Oral Daily   fluticasone furoate-vilanterol  1 puff Inhalation Daily   insulin aspart  0-9 Units Subcutaneous Q4H   metoprolol tartrate  25 mg Oral BID   mupirocin ointment   Nasal BID   nystatin  5 mL Oral QID   [START  ON 06/22/2022] pantoprazole  40 mg Intravenous Q12H   polyethylene glycol-electrolytes  4,000 mL Oral Once   torsemide  60 mg Oral Daily   umeclidinium bromide  1 puff Inhalation Daily   Continuous Infusions:  cefTRIAXone (ROCEPHIN)  IV Stopped (06/20/22 2137)   dextrose 5 % and 0.9% NaCl 50 mL/hr at 06/21/22 0738   pantoprazole 8 mg/hr (06/21/22 0732)    Procedures/Studies: DG Chest 1 View  Result Date: 06/18/2022 CLINICAL DATA:  Altered mental status. EXAM: CHEST  1 VIEW COMPARISON:  Chest radiograph 04/12/2022, CT chest 04/12/2022 FINDINGS: The heart is enlarged.  The upper mediastinal contours  are normal. Previously seen opacity in the left upper lobe has essentially resolved, with mild residual patchy opacity which may reflect residual scarring. There is no new or worsening focal airspace disease. There are diffusely increased interstitial markings possibly reflecting mild pulmonary interstitial edema. There is no significant pleural effusion. There is no pneumothorax. Note that the smaller nodular opacity seen on the prior chest CT are not seen on the current study. There is no acute osseous abnormality. IMPRESSION: 1. Cardiomegaly with possible mild pulmonary interstitial edema. 2. Essentially resolved airspace opacity in the left upper lobe with probable residual scarring. 3. Additional smaller nodules seen on the prior chest CT are not seen on the current study. Please refer to that study for follow-up recommendations. Electronically Signed   By: Valetta Mole M.D.   On: 06/18/2022 20:09   DG Tibia/Fibula Left  Result Date: 06/18/2022 CLINICAL DATA:  Left lower extremity erythema EXAM: LEFT TIBIA AND FIBULA - 2 VIEW COMPARISON:  03/22/2022 FINDINGS: Frontal and lateral views of the left tibia and fibula are obtained. There is a residual screw traversing the medial malleolus. K-wire within the medial malleolus and the plate and screw fixation across the distal fibula have been removed in the  interim. New heterotopic ossification along the dorsal margin of the distal fibula. There are no acute displaced fractures. The bones are diffusely osteopenic. No evidence of bony destruction or periosteal reaction to suggest osteomyelitis. There is diffuse subcutaneous edema. IMPRESSION: 1. Diffuse subcutaneous edema consistent with cellulitis. 2. No radiographic evidence of osteomyelitis. 3. Diffuse osteopenia. 4. Revision of orthopedic hardware about the left ankle as above. Electronically Signed   By: Randa Ngo M.D.   On: 06/18/2022 20:06   CT Head Wo Contrast  Result Date: 06/18/2022 CLINICAL DATA:  Altered mental status EXAM: CT HEAD WITHOUT CONTRAST TECHNIQUE: Contiguous axial images were obtained from the base of the skull through the vertex without intravenous contrast. RADIATION DOSE REDUCTION: This exam was performed according to the departmental dose-optimization program which includes automated exposure control, adjustment of the mA and/or kV according to patient size and/or use of iterative reconstruction technique. COMPARISON:  05/17/2019 FINDINGS: Brain: No mass, hemorrhage or extra-axial collection. There is periventricular hypoattenuation compatible with chronic microvascular disease. 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 Generalized volume loss. Vascular: Atherosclerotic calcification of the internal carotid and vertebral arteries at skull base. Skull: Normal. Negative for fracture or focal lesion. Sinuses/Orbits: No acute finding. Other: None. IMPRESSION: 1. No acute intracranial abnormality. 2. 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 Electronically Signed   By: Ulyses Jarred M.D.   On: 06/18/2022 19:50    Barton Dubois, MD  [facility]  If 7PM-7AM, please contact night-coverage [email or web address] Password [email or web address] 06/21/2022, 5:21 PM   LOS: 3 days

## 2022-06-21 NOTE — Progress Notes (Signed)
Discussed with Dr. Abbey Chatters. Plan for colonoscopy tomorrow given unremarkable EGD findings and reported overt GI bleeding in the ED and need for chronic Eliquis.   Eric Scott. Bernarda Caffey [facility] [phone] 1/18/20241:52 PM

## 2022-06-21 NOTE — Progress Notes (Signed)
Patient  spo2 88% on 4lpm cann changed back to 6lpm salter HFNC per patient o2 from ICU

## 2022-06-21 NOTE — Progress Notes (Addendum)
Pt in pacu, labored breathing noted.  Pt verbalizes understanding but unable to speak in full sentences. Per Dr. Delma Freeze, pt at baseline and stable for transfer to 326 as well as floor nurse, Webb Silversmith states pt baseline

## 2022-06-21 NOTE — Progress Notes (Signed)
   06/21/22 1933  Vitals  Temp 97.7 F (36.5 C)  Temp Source Oral  BP (!) 162/77  MAP (mmHg) 104  BP Location Right Arm  BP Method Automatic  Patient Position (if appropriate) Lying  Pulse Rate 90  Pulse Rate Source Monitor  Resp 16  MEWS COLOR  MEWS Score Color Green  Oxygen Therapy  SpO2 94 %  O2 Device Nasal Cannula  O2 Flow Rate (L/min) 8 L/min  MEWS Score  MEWS Temp 0  MEWS Systolic 0  MEWS Pulse 0  MEWS RR 0  MEWS LOC 0  MEWS Score 0   Patient was given scheduled metoprolol.

## 2022-06-21 NOTE — Anesthesia Preprocedure Evaluation (Signed)
Anesthesia Evaluation  Patient identified by MRN, date of birth, ID band Patient awake    Reviewed: Allergy & Precautions, NPO status , Patient's Chart, lab work & pertinent test results  Airway Mallampati: III     Mouth opening: Limited Mouth Opening Comment: L sided jaw pain, possible dental infection Dental  (+) Poor Dentition, Dental Advisory Given   Pulmonary COPD, Current Smoker, former smoker On O2 for low Saturations    + decreased breath sounds      Cardiovascular hypertension, + Peripheral Vascular Disease and +CHF   Rhythm:Regular Rate:Normal  Echo: 1. Left ventricular ejection fraction, by estimation, is 55 to 60%. The  left ventricle has normal function. Left ventricular endocardial border  not optimally defined to evaluate regional wall motion. There is severe  left ventricular hypertrophy. Left  ventricular diastolic parameters are indeterminate.  2. Right ventricular systolic function is normal. The right ventricular  size is normal.  3. The mitral valve was not well visualized. No evidence of mitral valve  regurgitation. No evidence of mitral stenosis.  4. The aortic valve was not well visualized. Aortic valve regurgitation  is not visualized. No aortic stenosis is present.  5. Aortic dilatation noted. 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  6. Technically difficult study, limited visualization    Neuro/Psych  PSYCHIATRIC DISORDERS Anxiety Depression     Neuromuscular disease CVA    GI/Hepatic Neg liver ROS, PUD,GERD  ,,melena   Endo/Other  diabetes    Renal/GU Renal disease     Musculoskeletal negative musculoskeletal ROS (+)    Abdominal  (+) + obese  Peds  Hematology negative hematology ROS (+)   Anesthesia Other Findings   Reproductive/Obstetrics                             Anesthesia Physical Anesthesia Plan  ASA: 4  Anesthesia  Plan: General   Post-op Pain Management: Minimal or no pain anticipated   Induction: Intravenous  PONV Risk Score and Plan: TIVA and Propofol infusion  Airway Management Planned: Nasal Cannula and Natural Airway  Additional Equipment:   Intra-op Plan:   Post-operative Plan:   Informed Consent: I have reviewed the patients History and Physical, chart, labs and discussed the procedure including the risks, benefits and alternatives for the proposed anesthesia with the patient or authorized representative who has indicated his/her understanding and acceptance.       Plan Discussed with: CRNA  Anesthesia Plan Comments:         Anesthesia Quick Evaluation

## 2022-06-21 NOTE — Progress Notes (Signed)
Called by RN due to pt desating and feeling SOB. Upon entering room pt was on 12L [place] with sats in low 88-90s and climbing.  Pt stated he was SOB.  BBS were clear and slightly diminished.  Pt given PRN neb tx and monitored.  Sats increased to 98%.  Pt is currently on 7L [place] with sats of 96% after neb tx given.

## 2022-06-22 ENCOUNTER — Encounter (HOSPITAL_COMMUNITY): Payer: Self-pay | Admitting: Anesthesiology

## 2022-06-22 ENCOUNTER — Encounter (HOSPITAL_COMMUNITY)
Admission: EM | Disposition: A | Discharge status: Skilled Nursing Facility | Payer: Self-pay | Source: Skilled Nursing Facility | Attending: Internal Medicine

## 2022-06-22 DIAGNOSIS — K921 Melena: Secondary | ICD-10-CM | POA: Diagnosis not present

## 2022-06-22 DIAGNOSIS — E1122 Type 2 diabetes mellitus with diabetic chronic kidney disease: Secondary | ICD-10-CM | POA: Diagnosis not present

## 2022-06-22 DIAGNOSIS — I4892 Unspecified atrial flutter: Secondary | ICD-10-CM | POA: Diagnosis not present

## 2022-06-22 DIAGNOSIS — G9341 Metabolic encephalopathy: Secondary | ICD-10-CM | POA: Diagnosis not present

## 2022-06-22 LAB — RENAL FUNCTION PANEL
Albumin: 2.4 g/dL — ABNORMAL LOW (ref 3.5–5.0)
Anion gap: 10 (ref 5–15)
BUN: 45 mg/dL — ABNORMAL HIGH (ref 8–23)
CO2: 23 mmol/L (ref 22–32)
Calcium: 8.3 mg/dL — ABNORMAL LOW (ref 8.9–10.3)
Chloride: 113 mmol/L — ABNORMAL HIGH (ref 98–111)
Creatinine, Ser: 4.1 mg/dL — ABNORMAL HIGH (ref 0.61–1.24)
GFR, Estimated: 15 mL/min — ABNORMAL LOW (ref >60)
Glucose, Bld: 82 mg/dL (ref 70–99)
Phosphorus: 3.7 mg/dL (ref 2.5–4.6)
Potassium: 3.4 mmol/L — ABNORMAL LOW (ref 3.5–5.1)
Sodium: 146 mmol/L — ABNORMAL HIGH (ref 135–145)

## 2022-06-22 LAB — GLUCOSE, CAPILLARY
Glucose-Capillary: 139 mg/dL — ABNORMAL HIGH (ref 70–99)
Glucose-Capillary: 158 mg/dL — ABNORMAL HIGH (ref 70–99)
Glucose-Capillary: 164 mg/dL — ABNORMAL HIGH (ref 70–99)

## 2022-06-22 LAB — CBC
HCT: 28.8 % — ABNORMAL LOW (ref 39.0–52.0)
Hemoglobin: 8.4 g/dL — ABNORMAL LOW (ref 13.0–17.0)
MCH: 28.8 pg (ref 26.0–34.0)
MCHC: 29.2 g/dL — ABNORMAL LOW (ref 30.0–36.0)
MCV: 98.6 fL (ref 80.0–100.0)
Platelets: 240 10*3/uL (ref 150–400)
RBC: 2.92 MIL/uL — ABNORMAL LOW (ref 4.22–5.81)
RDW: 16.5 % — ABNORMAL HIGH (ref 11.5–15.5)
WBC: 11.6 10*3/uL — ABNORMAL HIGH (ref 4.0–10.5)
nRBC: 0.3 % — ABNORMAL HIGH (ref 0.0–0.2)

## 2022-06-22 SURGERY — COLONOSCOPY WITH PROPOFOL
Anesthesia: Choice

## 2022-06-22 MED ORDER — FUROSEMIDE 10 MG/ML IJ SOLN
80.0000 mg | Freq: Two times a day (BID) | INTRAMUSCULAR | Status: DC
  Administered 2022-06-22 – 2022-06-24 (×5): 80 mg via INTRAVENOUS
  Filled 2022-06-22 (×5): qty 8

## 2022-06-22 MED ORDER — NYSTATIN 100000 UNIT/GM EX POWD
Freq: Three times a day (TID) | CUTANEOUS | Status: DC
  Administered 2022-06-23 – 2022-07-01 (×2): 1 via TOPICAL
  Filled 2022-06-22 (×4): qty 15

## 2022-06-22 NOTE — Progress Notes (Addendum)
Per report from previous shift patient had refused bowel prep. After multiple attempts and encouragement patient began to drink his prep at approximately 2310. Nurse tech at bedside encouraging and assisting resident with his prep.

## 2022-06-22 NOTE — Progress Notes (Signed)
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  The patient was recently hospitalized from 04/12/2022 to 04/20/2022 secondary to acute on chronic respiratory failure due to COPD exacerbation, pneumonia and acute on chronic HFpEF.  During that hospitalization, the patient also had acute on chronic renal failure.  He was felt to be hypervolemic and started on torsemide.   Per triage notes and ED provider who talked to staff at facility, at about 5 PM staff noticed that patient was confused.  Reported his blood sugars ranging in the 400s subsequently dropped to 68>> 130.  It is unknown if insulin was given when his blood sugars were in the 400s, a different staff had taken over patient's care.  Glucagon was given.  He has also had chronic left lower extremity pain which is unchanged. He also got his Lantus on the day of admission. In the ED, the patient was afebrile hemodynamically stable with oxygen saturation 92-95% on 4 L.  WBC 8.7, hemoglobin 8.1, platelets 274,000.  Sodium 143, potassium 3.9, bicarbonate 23, serum creatinine 5.02.   0.4 mg IV Narcan given, with some initial improvement in mental status, subsequent 0.8 mg Narcan given without significant response.  Patient was admitted for altered mental status and acute on chronic renal failure.   Assessment/Plan: Acute on chronic metabolic encephalopathy -Multifactorial including hypoglycemia, UTI, AKI, polypharmacy in the setting of impaired renal function -06/18/2022 ABG 7.36/47/74/26 on  2-3 L -06/19/2022--mental status slightly improved but not at baseline -Mentation overall improving and close to baseline. -CT brain negative -Continue IV fluid resuscitation and IV antibiotics -Continue holding gabapentin for now. -Continue constant reorientation.  UTI -UA>50 WBC -Continue ceftriaxone therapy -Cultures not helpful given multiple species isolated -Will treat empirically for 5 days (currently day 4 out of 5). -Patient denies dysuria.  Acute on chronic renal failure--CKD stage IV -Baseline creatinine 3.3-3.7 -Presented with serum creatinine 5.02 -Nephrology consult appreciated -Creatinine trending down with fluid resuscitation and supportive care -Continue to minimize/avoid the use of nephrotoxic agents and follow creatinine. -No requiring hemodialysis at this point. -Creatinine 4.10 today. -Continue to follow creatinine trend.  Melena/FOBT positive -GI consultation appreciated. -holding apixaban temporarily -Continue NPO status. -had melena in ED at time of admission.  No further overt bleeding appreciated. -Continue PPI. -Status post endoscopy on 06/21/2022 without evidence of acute bleeding source. -Planning for colonoscopy later today; Currently NPO.  Paroxysmal atrial flutter -In and out of A-fib/flutter -Continue telemetry monitoring -Continue the use of amiodarone and metoprolol -Rate appears to be stable and well-controlled currently -Holding anticoagulation in the setting of melanotic stools and positive fecal occult blood test. -Plan for endoscopy later today. -Expressing to be hungry.  No overt bleeding appreciated.  Hemoglobin level stable.  Polymicrobial left ankle osteomyelitis- -s/p debridement/hardware removal by Dr. Sharol Given on 10/25  -finished 6 weeks abx on 05/09/22 -finished linezolid 11/25-12/6 -Continue wound care service recommendation.  Chronic respiratory failure with hypoxia and hypercarbia/COPD -on 3-4L at baseline -continue BDs as  needed. -No wheezing or signs of acute exacerbation currently. -Stable breathing. -Currently using 7-8 L of oxygen supplementation; chest x-ray  8 not demonstrating significant changes from admission images. -After discussing with nephrology we will attempt some diuresis and will follow response. -Continue to wean oxygen supplementation as tolerated -If there is any increased somnolence or concerns for obtundation will check ABG as discussed/ordered yesterday (06/21/2022).  Controlled DM2  -03/28/22 A1C--5.8 -hypoglycemic in am 1/16 -Continue sliding scale insulin. -Close monitoring of patient's CBGs especially while n.p.o. for procedure.  Gout -Not in flare -Continue colchicine   Chronic peripheral neuropathy/chronic back pain -Continue as needed judicious use of analgesics.   -Continue to hold gabapentin temporarily   Mood disorder -Overall stable-continue Prozac -Continue holding alprazolam temporarily, encouraged to minimize extra sedative and continue constant reorientation.   1.6 cm left thyroid nodule -Incidental finding on CT chest -Further work-up deferred to outpatient setting.   7 mm left lower lobe nodule -Incidental finding on CT chest. -Repeat CT chest 6-12 months.   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   Morbid Obesity: -Body mass index is 36.48 kg/m. -Low-calorie diet, portion control and increase physical activity discussed with patient.  Left leg wound -Continue to follow recommendation from wound care service -No signs of acute superimposed infection appreciated.   Family Communication:  no Family at bedside  Consultants:  renal  Code Status:  FULL   DVT Prophylaxis: SCDs.  Patient chronically on Eliquis; on hold in the setting of GI bleed.   Procedures: As Listed in Progress Note Above  Antibiotics: Ceftriaxone 1/15>>   Subjective: No fever, no chest pain, no  nausea, no vomiting, no palpitations.  Currently n.p.o. in anticipation for possible colonoscopy evaluation.  7-8 L nasal cannula supplementation in place as he has been more short of breath since endoscopic evaluation yesterday.   Objective: Vitals:   06/22/22 0308 06/22/22 0421 06/22/22 0751 06/22/22 1457  BP: 138/73 (!) 181/80  138/76  Pulse:  90  89  Resp:  (!) 24    Temp:  97.8 F (36.6 C)  98.6 F (37 C)  TempSrc:  Oral  Oral  SpO2:  93% 90% 100%  Weight:      Height:        Intake/Output Summary (Last 24 hours) at 06/22/2022 1613 Last data filed at 06/22/2022 1500 Gross per 24 hour  Intake 0 ml  Output 1550 ml  Net -1550 ml   Weight change:  Exam: General exam: Alert, awake, oriented x 2; reports no chest pain, no nausea or vomiting and no fever.  Since endoscopic evaluation requiring high level of oxygen supplementation. Respiratory system: No crackles on exam; positive rhonchi.  No using accessory muscle.  7-8 L nasal cannula supplementation in place. Cardiovascular system: Rate controlled, no rubs, no gallops, no JVD. Gastrointestinal system: Abdomen is obese, nondistended, soft and nontender. No organomegaly or masses felt. Normal bowel sounds heard. Central nervous system: Alert and oriented. No focal neurological deficits. Extremities/skin: No cyanosis or clubbing, chronic extensive dermatitis appreciated bilaterally; 1+ edema seen on examination.  Chronic left lower extremity wound appreciated on exam and without signs of superimposed infection.  Clean dressings in place. Skin: No rashes, lesions or ulcers Psychiatry: Judgement and insight appear normal. Mood & affect appropriate.   Data Reviewed: I have personally reviewed following labs and imaging studies  Basic Metabolic Panel: Recent Labs  Lab 06/18/22 1917 06/18/22 2055 06/19/22 0555 06/20/22 0428 06/21/22 0346 06/22/22 0353  NA 142  --  143 143 145 146*  K 3.7  --  3.9  3.7 3.8 3.4*  CL 110  --  109  110 111 113*  CO2 23  --  '23 24 25 23  '$ GLUCOSE 92  --  96 92 101* 82  BUN 65*  --  62* 56* 50* 45*  CREATININE 5.02*  --  4.91* 4.57* 4.42* 4.10*  CALCIUM 7.9*  --  8.1* 8.2* 8.4* 8.3*  MG  --  2.3  --   --   --   --   PHOS  --   --   --  4.4 4.2 3.7   Liver Function Tests: Recent Labs  Lab 06/18/22 1917 06/20/22 0428 06/21/22 0346 06/22/22 0353  AST 9*  --   --   --   ALT 8  --   --   --   ALKPHOS 65  --   --   --   BILITOT 0.7  --   --   --   PROT 6.1*  --   --   --   ALBUMIN 2.4* 2.4* 2.4* 2.4*   Recent Labs  Lab 06/18/22 1917  AMMONIA <10   CBC: Recent Labs  Lab 06/18/22 1917 06/19/22 0010 06/19/22 0555 06/19/22 1138 06/21/22 1113 06/22/22 0353  WBC 7.8 9.0 8.7 9.7 10.8* 11.6*  NEUTROABS 6.6  --   --   --   --   --   HGB 7.8* 7.9* 8.1* 8.1* 8.5* 8.4*  HCT 26.8* 26.9* 27.8* 28.0* 28.6* 28.8*  MCV 100.0 98.9 99.3 100.0 98.3 98.6  PLT 244 262 274 276 240 240   CBG: Recent Labs  Lab 06/21/22 0735 06/21/22 1127 06/21/22 1642 06/21/22 1935 06/21/22 2347  GLUCAP 113* 115* 140* 128* 188*   Urine analysis:    Component Value Date/Time   COLORURINE YELLOW 06/18/2022 2027   APPEARANCEUR CLOUDY (A) 06/18/2022 2027   LABSPEC 1.010 06/18/2022 2027   PHURINE 5.0 06/18/2022 2027   GLUCOSEU NEGATIVE 06/18/2022 2027   HGBUR LARGE (A) 06/18/2022 2027   BILIRUBINUR NEGATIVE 06/18/2022 2027   KETONESUR NEGATIVE 06/18/2022 2027   PROTEINUR 30 (A) 06/18/2022 2027   UROBILINOGEN 0.2 03/04/2015 1835   NITRITE NEGATIVE 06/18/2022 2027   LEUKOCYTESUR LARGE (A) 06/18/2022 2027   Sepsis Labs:  Recent Results (from the past 240 hour(s))  Resp panel by RT-PCR (RSV, Flu A&B, Covid) Anterior Nasal Swab     Status: None   Collection Time: 06/18/22  7:13 PM   Specimen: Anterior Nasal Swab  Result Value Ref Range Status   SARS Coronavirus 2 by RT PCR NEGATIVE NEGATIVE Final    Comment: (NOTE) SARS-CoV-2 target nucleic acids are NOT DETECTED.  The SARS-CoV-2 RNA is  generally detectable in upper respiratory specimens during the acute phase of infection. The lowest concentration of SARS-CoV-2 viral copies this assay can detect is 138 copies/mL. A negative result does not preclude SARS-Cov-2 infection and should not be used as the sole basis for treatment or other patient management decisions. A negative result may occur with  improper specimen collection/handling, submission of specimen other than nasopharyngeal swab, presence of viral mutation(s) within the areas targeted by this assay, and inadequate number of viral copies(<138 copies/mL). A negative result must be combined with clinical observations, patient history, and epidemiological information. The expected result is Negative.  Fact Sheet for Patients:  EntrepreneurPulse.com.au  Fact Sheet for Healthcare Providers:  IncredibleEmployment.be  This test is no t yet approved or cleared by the [place] FDA and  has been authorized for detection and/or diagnosis of  SARS-CoV-2 by FDA under an Emergency Use Authorization (EUA). This EUA will remain  in effect (meaning this test can be used) for the duration of the COVID-19 declaration under Section 564(b)(1) of the Act, 21 U.S.C.section 360bbb-3(b)(1), unless the authorization is terminated  or revoked sooner.       Influenza A by PCR NEGATIVE NEGATIVE Final   Influenza B by PCR NEGATIVE NEGATIVE Final    Comment: (NOTE) The Xpert Xpress SARS-CoV-2/FLU/RSV plus assay is intended as an aid in the diagnosis of influenza from Nasopharyngeal swab specimens and should not be used as a sole basis for treatment. Nasal washings and aspirates are unacceptable for Xpert Xpress SARS-CoV-2/FLU/RSV testing.  Fact Sheet for Patients: EntrepreneurPulse.com.au  Fact Sheet for Healthcare Providers: IncredibleEmployment.be  This test is not yet approved or cleared by the [place] FDA and has been authorized for detection and/or diagnosis of SARS-CoV-2 by FDA under an Emergency Use Authorization (EUA). This EUA will remain in effect (meaning this test can be used) for the duration of the COVID-19 declaration under Section 564(b)(1) of the Act, 21 U.S.C. section 360bbb-3(b)(1), unless the authorization is terminated or revoked.     Resp Syncytial Virus by PCR NEGATIVE NEGATIVE Final    Comment: (NOTE) Fact Sheet for Patients: EntrepreneurPulse.com.au  Fact Sheet for Healthcare Providers: IncredibleEmployment.be  This test is not yet approved or cleared by the [place] FDA and has been authorized for detection and/or diagnosis of SARS-CoV-2 by FDA under an Emergency Use Authorization (EUA). This EUA will remain in effect (meaning this test can be used) for the duration of the COVID-19 declaration under Section 564(b)(1) of the Act, 21 U.S.C. section 360bbb-3(b)(1), unless the authorization is terminated or revoked.  Performed at [facility], [address], [place], [place] [address]   Urine Culture     Status: Abnormal   Collection Time: 06/18/22  8:27 PM   Specimen: Urine, Clean Catch  Result Value Ref Range Status   Specimen Description   Final    URINE, CLEAN CATCH Performed at [facility], [address] [place], [place] [address]    Special Requests   Final    NONE Performed at [facility], [address] [place], [place] [address]    Culture MULTIPLE SPECIES PRESENT, SUGGEST RECOLLECTION (A)  Final   Report Status 06/20/2022 FINAL  Final  MRSA Next Gen by PCR, Nasal     Status: Abnormal   Collection Time: 06/19/22  1:42 PM   Specimen: Nasal Mucosa; Nasal Swab  Result Value Ref Range Status   MRSA by PCR Next Gen DETECTED (A) NOT DETECTED Final    Comment: RESULT CALLED TO, READ BACK BY AND VERIFIED WITH: JENNIFER KING @ [phone] ON 06/20/22 C VARNER (NOTE) The GeneXpert MRSA Assay (FDA approved  for NASAL specimens only), is one component of a comprehensive MRSA colonization surveillance program. It is not intended to diagnose MRSA infection nor to guide or monitor treatment for MRSA infections. Test performance is not FDA approved in patients less than 11 years old. Performed at [facility], [address], [place], [place] [address]      Scheduled Meds:  amiodarone  200 mg Oral Daily   calcitRIOL  0.25 mcg Oral Daily   Chlorhexidine Gluconate Cloth  6 each Topical Daily   darbepoetin (ARANESP) injection - NON-DIALYSIS  300 mcg Subcutaneous Q Tue-1800   FLUoxetine  40 mg Oral Daily   fluticasone furoate-vilanterol  1 puff Inhalation Daily   furosemide  80 mg Intravenous BID   insulin aspart  0-9 Units Subcutaneous Q4H   metoprolol tartrate  25 mg Oral BID   mupirocin ointment   Nasal BID   nystatin  5 mL Oral QID   nystatin   Topical TID   pantoprazole  40 mg Intravenous Q12H   polyethylene glycol-electrolytes  4,000 mL Oral Once   umeclidinium bromide  1 puff Inhalation Daily   Continuous Infusions:  cefTRIAXone (ROCEPHIN)  IV 1 g (06/21/22 2037)   dextrose 5 % and 0.9% NaCl 50 mL/hr at 06/22/22 7741    Procedures/Studies: DG CHEST PORT 1 VIEW  Result Date: 06/21/2022 CLINICAL DATA:  Shortness of breath.  [id number] EXAM: PORTABLE CHEST 1 VIEW COMPARISON:  06/18/2022 FINDINGS: Cardiac enlargement with mild pulmonary vascular congestion. Linear scarring in the upper lungs. Diffuse interstitial pattern to the lungs is likely edema. Appearance is not significantly changed since prior study. No pleural effusions. No pneumothorax. Mediastinal contours appear intact. Calcification of the aorta. IMPRESSION: Cardiac enlargement with pulmonary vascular congestion and mild interstitial edema similar to prior study. Electronically Signed   By: Lucienne Capers M.D.   On: 06/21/2022 18:26   DG Chest 1 View  Result Date: 06/18/2022 CLINICAL DATA:  Altered mental status. EXAM: CHEST  1  VIEW COMPARISON:  Chest radiograph 04/12/2022, CT chest 04/12/2022 FINDINGS: The heart is enlarged.  The upper mediastinal contours are normal. Previously seen opacity in the left upper lobe has essentially resolved, with mild residual patchy opacity which may reflect residual scarring. There is no new or worsening focal airspace disease. There are diffusely increased interstitial markings possibly reflecting mild pulmonary interstitial edema. There is no significant pleural effusion. There is no pneumothorax. Note that the smaller nodular opacity seen on the prior chest CT are not seen on the current study. There is no acute osseous abnormality. IMPRESSION: 1. Cardiomegaly with possible mild pulmonary interstitial edema. 2. Essentially resolved airspace opacity in the left upper lobe with probable residual scarring. 3. Additional smaller nodules seen on the prior chest CT are not seen on the current study. Please refer to that study for follow-up recommendations. Electronically Signed   By: Valetta Mole M.D.   On: 06/18/2022 20:09   DG Tibia/Fibula Left  Result Date: 06/18/2022 CLINICAL DATA:  Left lower extremity erythema EXAM: LEFT TIBIA AND FIBULA - 2 VIEW COMPARISON:  03/22/2022 FINDINGS: Frontal and lateral views of the left tibia and fibula are obtained. There is a residual screw traversing the medial malleolus. K-wire within the medial malleolus and the plate and screw fixation across the distal fibula have been removed in the interim. New heterotopic ossification along the dorsal margin of the distal fibula. There are no acute displaced fractures. The bones are diffusely osteopenic. No evidence of bony destruction or periosteal reaction to suggest osteomyelitis. There is diffuse subcutaneous edema. IMPRESSION: 1. Diffuse subcutaneous edema consistent with cellulitis. 2. No radiographic evidence of osteomyelitis. 3. Diffuse osteopenia. 4. Revision of orthopedic hardware about the left ankle as above.  Electronically Signed   By: Randa Ngo M.D.   On: 06/18/2022 20:06   CT Head Wo Contrast  Result Date: 06/18/2022 CLINICAL DATA:  Altered mental status EXAM: CT HEAD WITHOUT CONTRAST TECHNIQUE: Contiguous axial images were obtained from the base of the skull through the vertex without intravenous contrast. RADIATION DOSE REDUCTION: This exam was performed according to the departmental dose-optimization program which includes automated exposure control, adjustment of the mA and/or kV according to patient size and/or use  of iterative reconstruction technique. COMPARISON:  05/17/2019 FINDINGS: Brain: No mass, hemorrhage or extra-axial collection. There is periventricular hypoattenuation compatible with chronic microvascular disease. 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 Generalized volume loss. Vascular: Atherosclerotic calcification of the internal carotid and vertebral arteries at skull base. Skull: Normal. Negative for fracture or focal lesion. Sinuses/Orbits: No acute finding. Other: None. IMPRESSION: 1. No acute intracranial abnormality. 2. 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 Electronically Signed   By: Ulyses Jarred M.D.   On: 06/18/2022 19:50    Barton Dubois, MD  [facility]  If 7PM-7AM, please contact night-coverage [email or web address] Password [email or web address] 06/22/2022, 4:13 PM   LOS: 4 days

## 2022-06-22 NOTE — Care Management Important Message (Signed)
Important Message  Patient Details  Name: Eric Scott MRN: [id number] Date of Birth: April 18, 1951   Medicare Important Message Given:  Other (see comment) (unable to reach by phone, unable to reach family, copy mailed to address on file due to contact precautions)     Tommy Medal 06/22/2022, 3:55 PM

## 2022-06-22 NOTE — Progress Notes (Signed)
Gastroenterology Progress Note   Referring Provider: No ref. provider found Primary Care Physician:  [name], MD Primary Gastroenterologist:  formerly Dr. Oneida Alar  Patient ID: Eric Scott; [id number]; 05-27-1951    Subjective   Not much of an appetite this afternoon. Drowsy. Still having some abdominal pain/tenderness.    Objective   Vital signs in last 24 hours Temp:  [97 F (36.1 C)-99.1 F (37.3 C)] 97.8 F (36.6 C) (01/19 0421) Pulse Rate:  [88-90] 90 (01/19 0421) Resp:  [12-24] 24 (01/19 0421) BP: (124-181)/(73-90) 181/80 (01/19 0421) SpO2:  [88 %-100 %] 90 % (01/19 0751) Last BM Date : 06/22/22  Physical Exam General:   Alert and oriented, pleasant Head:  Normocephalic and atraumatic. Eyes:  No icterus, sclera clear. Conjuctiva pink.  Mouth:  Without lesions, mucosa pink and moist.  Heart:  S1, S2 present, no murmurs noted.  Lungs: Clear to auscultation bilaterally, without wheezing, rales, or rhonchi.  Abdomen:  Bowel sounds present, non-distended. Generalized tenderness on exam. No HSM or hernias noted. No rebound or guarding. No masses appreciated Msk:  Symmetrical without gross deformities. Normal posture. Extremities:  Without clubbing or edema. Neurologic: drowsy, follows commands Psych:  Alert and cooperative. Normal mood and affect.  Intake/Output from previous day: 01/18 0701 - 01/19 0700 In: 918.2 [I.V.:818.2; IV Piggyback:100] Out: 550 [Urine:550] Intake/Output this shift: No intake/output data recorded.  Lab Results  Recent Labs    06/21/22 1113 06/22/22 0353  WBC 10.8* 11.6*  HGB 8.5* 8.4*  HCT 28.6* 28.8*  PLT 240 240   BMET Recent Labs    06/20/22 0428 06/21/22 0346 06/22/22 0353  NA 143 145 146*  K 3.7 3.8 3.4*  CL 110 111 113*  CO2 '24 25 23  '$ GLUCOSE 92 101* 82  BUN 56* 50* 45*  CREATININE 4.57* 4.42* 4.10*  CALCIUM 8.2* 8.4* 8.3*   LFT Recent Labs    06/20/22 0428 06/21/22 0346 06/22/22 0353  ALBUMIN 2.4*  2.4* 2.4*   PT/INR No results for input(s): "LABPROT", "INR" in the last 72 hours. Hepatitis Panel No results for input(s): "HEPBSAG", "HCVAB", "HEPAIGM", "HEPBIGM" in the last 72 hours.  Studies/Results DG CHEST PORT 1 VIEW  Result Date: 06/21/2022 CLINICAL DATA:  Shortness of breath.  [id number] EXAM: PORTABLE CHEST 1 VIEW COMPARISON:  06/18/2022 FINDINGS: Cardiac enlargement with mild pulmonary vascular congestion. Linear scarring in the upper lungs. Diffuse interstitial pattern to the lungs is likely edema. Appearance is not significantly changed since prior study. No pleural effusions. No pneumothorax. Mediastinal contours appear intact. Calcification of the aorta. IMPRESSION: Cardiac enlargement with pulmonary vascular congestion and mild interstitial edema similar to prior study. Electronically Signed   By: Lucienne Capers M.D.   On: 06/21/2022 18:26   DG Chest 1 View  Result Date: 06/18/2022 CLINICAL DATA:  Altered mental status. EXAM: CHEST  1 VIEW COMPARISON:  Chest radiograph 04/12/2022, CT chest 04/12/2022 FINDINGS: The heart is enlarged.  The upper mediastinal contours are normal. Previously seen opacity in the left upper lobe has essentially resolved, with mild residual patchy opacity which may reflect residual scarring. There is no new or worsening focal airspace disease. There are diffusely increased interstitial markings possibly reflecting mild pulmonary interstitial edema. There is no significant pleural effusion. There is no pneumothorax. Note that the smaller nodular opacity seen on the prior chest CT are not seen on the current study. There is no acute osseous abnormality. IMPRESSION: 1. Cardiomegaly with possible mild pulmonary interstitial edema. 2. Essentially resolved  airspace opacity in the left upper lobe with probable residual scarring. 3. Additional smaller nodules seen on the prior chest CT are not seen on the current study. Please refer to that study for follow-up  recommendations. Electronically Signed   By: Valetta Mole M.D.   On: 06/18/2022 20:09   DG Tibia/Fibula Left  Result Date: 06/18/2022 CLINICAL DATA:  Left lower extremity erythema EXAM: LEFT TIBIA AND FIBULA - 2 VIEW COMPARISON:  03/22/2022 FINDINGS: Frontal and lateral views of the left tibia and fibula are obtained. There is a residual screw traversing the medial malleolus. K-wire within the medial malleolus and the plate and screw fixation across the distal fibula have been removed in the interim. New heterotopic ossification along the dorsal margin of the distal fibula. There are no acute displaced fractures. The bones are diffusely osteopenic. No evidence of bony destruction or periosteal reaction to suggest osteomyelitis. There is diffuse subcutaneous edema. IMPRESSION: 1. Diffuse subcutaneous edema consistent with cellulitis. 2. No radiographic evidence of osteomyelitis. 3. Diffuse osteopenia. 4. Revision of orthopedic hardware about the left ankle as above. Electronically Signed   By: Randa Ngo M.D.   On: 06/18/2022 20:06   CT Head Wo Contrast  Result Date: 06/18/2022 CLINICAL DATA:  Altered mental status EXAM: CT HEAD WITHOUT CONTRAST TECHNIQUE: Contiguous axial images were obtained from the base of the skull through the vertex without intravenous contrast. RADIATION DOSE REDUCTION: This exam was performed according to the departmental dose-optimization program which includes automated exposure control, adjustment of the mA and/or kV according to patient size and/or use of iterative reconstruction technique. COMPARISON:  05/17/2019 FINDINGS: Brain: No mass, hemorrhage or extra-axial collection. There is periventricular hypoattenuation compatible with chronic microvascular disease. 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 Generalized volume loss. Vascular: Atherosclerotic calcification of the internal carotid and vertebral arteries at skull base. Skull: Normal. Negative for fracture  or focal lesion. Sinuses/Orbits: No acute finding. Other: None. IMPRESSION: 1. No acute intracranial abnormality. 2. 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 Electronically Signed   By: Ulyses Jarred M.D.   On: 06/18/2022 19:50    Assessment  72 y.o. male with a history of CKD stage IV, COPD on 3-4L oxygen at home, diabetes, DVT, HTN, PVD, CVA, A-fib on Eliquis, ascending thoracic aneurysm, recent pneumonia, osteomyelitis of left ankle with recent admission s/p hardware removal and debridement who presented 06/18/2022 with AMS, diminished oral intake.  GI consulted for acute on chronic anemia/melena.  Acute on chronic anemia, melena: Slight decline in hemoglobin since admission from 8.5-7.8.  Stable at 8.4 this morning.  Ferritin 149, iron 31, saturation 21%.  Reported melena in the ED no reports of bleeding prior to admission.  History of duodenal ulcers in 2018.  Chronically anticoagulated with Eliquis.  Underwent EGD 06/21/2022 no evidence of erosion, ulceration, or esophagitis in the esophagus.  There was evidence of gastritis and duodenitis.  Given maroon stool observed in the ED colonoscopy was recommended for further evaluation of anemia.  Patient did prep yesterday however had an increase in oxygen requirements up to 8 L nasal cannula.  Dr. Abbey Chatters discussed with anesthesia this morning regarding patient's respiratory status who recommended that if colonoscopy performed inpatient that he would need to be intubated and weaned off the ventilator in the ICU.  Given his current respiratory status the decision was made to hold off on colonoscopy for now and revisit outpatient.  Advise restarting anticoagulation and continue to monitor for worsening  anemia and overt GI bleeding.  If no signs of bleeding while inpatient will defer further workup outpatient.  For now we will continue PPI twice daily.  Will give clear liquid diet today.  Plan / Recommendations  Hold  on colonoscopy given respiratory decline, will revisit outpatient as per anesthesia patient would require intubation to perform procedure and transfer to ICU for vent weaning.  Restart anticoagulation and monitor for worsening anemia and overt GI bleeding.  PPI BID Monitor H/H, transfuse Hgb <7 Clear liquid diet, advance as tolerated.    LOS: 4 days    06/22/2022, 11:43 AM   Venetia Night, MSN, FNP-BC, AGACNP-BC [facility]

## 2022-06-22 NOTE — Progress Notes (Signed)
Patient completed approximately 3/4 container of bowel prep and refused to finish. He has had several bowel movements that are dark brown with some solid pieces present.

## 2022-06-22 NOTE — Progress Notes (Signed)
[facility] Progress Note    Assessment/ Plan:   AKI on CKD4 -follows with Dr. Theador Hawthorne. B/L Cr in the 3's -AKI likely related to UTI, hypoglycemia. Peak Cr 5 on 1/15. Cr down to 4.1 today, c/w supportive care/fluids/abx. -no indication for RRT at this time. If it were to come to that, would recommend palliative consultation to address [place] as he is not an ideal candidate for [place] term dialysis in the context of co-morbidities and functional status and HD will likely adversely affect his QOL -diuresis plan as below -Avoid nephrotoxic medications including NSAIDs and iodinated intravenous contrast exposure unless the latter is absolutely indicated.  Preferred narcotic agents for pain control are hydromorphone, fentanyl, and methadone. Morphine should not be used. Avoid Baclofen and avoid oral sodium phosphate and magnesium citrate based laxatives / bowel preps. Continue strict Input and Output monitoring. Will monitor the patient closely with you and intervene or adjust therapy as indicated by changes in clinical status/labs   Acute on chronic metabolic encephalopathy -likely multifactorial: UTI, hypoglycemia, AKI, polypharmacy. Mental status is stable for me as compared to yesterday. Per primary service  UTI -on rocephin  Chronic anemia, melena/FOBT positive -GI consulted, EGD neg for bleed 1/18, Cscope planned for today -ESA started on 1/16. Transfuse PRN  HTN/volume -torsemide r/s 1/16. 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 PO4 WNL  H/o Hyperkalemia -on lokelma as an outpatient, continue to hold here given K WNL  Subjective:   Patient seen and examined bedside. SOB last night with desat's, improved with nebs. CXR revealed pulmonary vascular congestion. No on 8L [place]. He still  endorses SOB at this time.   Objective:   BP (!) 181/80 (BP Location: Right Arm)   Pulse 90   Temp 97.8 F (36.6 C) (Oral)   Resp (!) 24   Ht 6' (1.829 m)   Wt 122 kg   SpO2 90%   BMI 36.48 kg/m   Intake/Output Summary (Last 24 hours) at 06/22/2022 1005 Last data filed at 06/22/2022 0400 Gross per 24 hour  Intake 395.29 ml  Output 550 ml  Net -154.71 ml   Weight change:   Physical Exam: Gen:NAD, chronically ill appearing CVS: RRR Resp: diminished air entry bibasilar, on 8L [place] Abd: soft, nt/nd Ext: woody edema b/l LE, LLE dressing in place Neuro: awake, tremulous  Imaging: DG CHEST PORT 1 VIEW  Result Date: 06/21/2022 CLINICAL DATA:  Shortness of breath.  [id number] EXAM: PORTABLE CHEST 1 VIEW COMPARISON:  06/18/2022 FINDINGS: Cardiac enlargement with mild pulmonary vascular congestion. Linear scarring in the upper lungs. Diffuse interstitial pattern to the lungs is likely edema. Appearance is not significantly changed since prior study. No pleural effusions. No pneumothorax. Mediastinal contours appear intact. Calcification of the aorta. IMPRESSION: Cardiac enlargement with pulmonary vascular congestion and mild interstitial edema similar to prior study. Electronically Signed   By: Lucienne Capers M.D.   On: 06/21/2022 18:26    Labs: BMET Recent Labs  Lab 06/18/22 1917 06/19/22 0555 06/20/22 0428 06/21/22 0346 06/22/22 0353  NA 142 143 143 145 146*  K 3.7 3.9 3.7 3.8 3.4*  CL 110 109 110 111 113*  CO2 '23 23 24 25 23  '$ GLUCOSE 92 96 92 101* 82  BUN 65* 62* 56* 50* 45*  CREATININE 5.02* 4.91* 4.57* 4.42* 4.10*  CALCIUM 7.9* 8.1* 8.2* 8.4* 8.3*  PHOS  --   --  4.4 4.2 3.7   CBC Recent Labs  Lab 06/18/22 1917 06/19/22 0010 06/19/22 0555 06/19/22 1138 06/21/22 1113 06/22/22 0353  WBC 7.8   < > 8.7 9.7 10.8* 11.6*  NEUTROABS 6.6  --   --   --   --   --   HGB 7.8*   < > 8.1* 8.1* 8.5* 8.4*  HCT 26.8*   < > 27.8* 28.0* 28.6* 28.8*  MCV 100.0   < > 99.3 100.0 98.3  98.6  PLT 244   < > 274 276 240 240   < > = values in this interval not displayed.    Medications:     amiodarone  200 mg Oral Daily   calcitRIOL  0.25 mcg Oral Daily   Chlorhexidine Gluconate Cloth  6 each Topical Daily   darbepoetin (ARANESP) injection - NON-DIALYSIS  300 mcg Subcutaneous Q Tue-1800   FLUoxetine  40 mg Oral Daily   fluticasone furoate-vilanterol  1 puff Inhalation Daily   furosemide  80 mg Intravenous BID   insulin aspart  0-9 Units Subcutaneous Q4H   metoprolol tartrate  25 mg Oral BID   mupirocin ointment   Nasal BID   nystatin  5 mL Oral QID   nystatin   Topical TID   pantoprazole  40 mg Intravenous Q12H   polyethylene glycol-electrolytes  4,000 mL Oral Once   umeclidinium bromide  1 puff Inhalation Daily      Gean Quint, MD [facility] 06/22/2022, 10:05 AM

## 2022-06-22 NOTE — Progress Notes (Signed)
Wound care provided per order. Tap water enema given x2. Stool is light brown liquid with less solid pieces that previous bowel movement. Patient has been NPO since midnight.

## 2022-06-22 NOTE — Progress Notes (Signed)
Attempted to return phone call from son, Jadyn Barge., unsuccessful. Will attempt again as time allows.

## 2022-06-23 DIAGNOSIS — K921 Melena: Secondary | ICD-10-CM | POA: Diagnosis not present

## 2022-06-23 DIAGNOSIS — G9341 Metabolic encephalopathy: Secondary | ICD-10-CM | POA: Diagnosis not present

## 2022-06-23 DIAGNOSIS — E1122 Type 2 diabetes mellitus with diabetic chronic kidney disease: Secondary | ICD-10-CM | POA: Diagnosis not present

## 2022-06-23 DIAGNOSIS — I4892 Unspecified atrial flutter: Secondary | ICD-10-CM | POA: Diagnosis not present

## 2022-06-23 LAB — RENAL FUNCTION PANEL
Albumin: 2.4 g/dL — ABNORMAL LOW (ref 3.5–5.0)
Anion gap: 10 (ref 5–15)
BUN: 44 mg/dL — ABNORMAL HIGH (ref 8–23)
CO2: 23 mmol/L (ref 22–32)
Calcium: 8.2 mg/dL — ABNORMAL LOW (ref 8.9–10.3)
Chloride: 112 mmol/L — ABNORMAL HIGH (ref 98–111)
Creatinine, Ser: 3.98 mg/dL — ABNORMAL HIGH (ref 0.61–1.24)
GFR, Estimated: 15 mL/min — ABNORMAL LOW (ref >60)
Glucose, Bld: 116 mg/dL — ABNORMAL HIGH (ref 70–99)
Phosphorus: 3.6 mg/dL (ref 2.5–4.6)
Potassium: 3.2 mmol/L — ABNORMAL LOW (ref 3.5–5.1)
Sodium: 145 mmol/L (ref 135–145)

## 2022-06-23 LAB — GLUCOSE, CAPILLARY
Glucose-Capillary: 171 mg/dL — ABNORMAL HIGH (ref 70–99)
Glucose-Capillary: 168 mg/dL — ABNORMAL HIGH (ref 70–99)
Glucose-Capillary: 124 mg/dL — ABNORMAL HIGH (ref 70–99)
Glucose-Capillary: 160 mg/dL — ABNORMAL HIGH (ref 70–99)
Glucose-Capillary: 119 mg/dL — ABNORMAL HIGH (ref 70–99)

## 2022-06-23 MED ORDER — ORAL CARE MOUTH RINSE
15.0000 mL | OROMUCOSAL | Status: DC
  Administered 2022-06-24 – 2022-07-02 (×30): 15 mL via OROMUCOSAL

## 2022-06-23 MED ORDER — ORAL CARE MOUTH RINSE: 15.0000 mL | OROMUCOSAL | Status: DC | PRN

## 2022-06-23 MED ORDER — POTASSIUM CHLORIDE CRYS ER 20 MEQ PO TBCR
40.0000 meq | EXTENDED_RELEASE_TABLET | Freq: Once | ORAL | Status: AC
  Administered 2022-06-23: 40 meq via ORAL
  Filled 2022-06-23: qty 2

## 2022-06-23 NOTE — Progress Notes (Signed)
Patient has been awake most of this shift. Patient is still on 8L of high flow oxygen with an O2 say of 92%. Patient dressing changed to lower left leg.

## 2022-06-23 NOTE — Progress Notes (Signed)
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  The patient was recently hospitalized from 04/12/2022 to 04/20/2022 secondary to acute on chronic respiratory failure due to COPD exacerbation, pneumonia and acute on chronic HFpEF.  During that hospitalization, the patient also had acute on chronic renal failure.  He was felt to be hypervolemic and started on torsemide.   Per triage notes and ED provider who talked to staff at facility, at about 5 PM staff noticed that patient was confused.  Reported his blood sugars ranging in the 400s subsequently dropped to 68>> 130.  It is unknown if insulin was given when his blood sugars were in the 400s, a different staff had taken over patient's care.  Glucagon was given.  He has also had chronic left lower extremity pain which is unchanged. He also got his Lantus on the day of admission. In the ED, the patient was afebrile hemodynamically stable with oxygen saturation 92-95% on 4 L.  WBC 8.7, hemoglobin 8.1, platelets 274,000.  Sodium 143, potassium 3.9, bicarbonate 23, serum creatinine 5.02.   0.4 mg IV Narcan given, with some initial improvement in mental status, subsequent 0.8 mg Narcan given without significant response.  Patient was admitted for altered mental status and acute on chronic renal failure.   Assessment/Plan: Acute on chronic metabolic encephalopathy -Multifactorial including hypoglycemia, UTI, AKI, polypharmacy in the setting of impaired renal function -06/18/2022 ABG 7.36/47/74/26 on  2-3 L -06/19/2022--mental status slightly improved but not at baseline -Mentation overall improved and back to baseline.   -CT brain negative -Continue IV fluid resuscitation and IV antibiotics -Continue holding gabapentin for now. -Continue constant reorientation.  UTI -UA>50 WBC -Continue ceftriaxone therapy -Cultures not helpful given multiple species isolated -Will treat empirically for 5 days (currently course and last day of antibiotics, day 5 out of 5). -Patient denies dysuria at this point..  Acute on chronic renal failure--CKD stage IV -Baseline creatinine 3.3-3.7 -Presented with serum creatinine 5.02 -Nephrology consult appreciated -Creatinine trending down with fluid resuscitation and supportive care -Continue to minimize/avoid the use of nephrotoxic agents and follow creatinine. -No requiring hemodialysis at this point. -Creatinine 3.98 today. -Continue to follow creatinine trend.  Melena/FOBT positive -GI consultation appreciated. -holding apixaban temporarily -Continue NPO status. -had melena in ED at time of admission.  No further overt bleeding appreciated. -Continue PPI. -Status post endoscopy on 06/21/2022 without evidence of acute bleeding source. -Planning for colonoscopy once respiratory status further improved; continue clear liquid diet.  Paroxysmal atrial flutter -In and out of A-fib/flutter -Continue telemetry monitoring -Continue the use of amiodarone and metoprolol -Rate appears to be stable and well-controlled currently -Holding anticoagulation in the setting of melanotic stools and positive fecal occult blood test. -Plan for endoscopy later today. -Expressing to be hungry.  No overt bleeding appreciated.  Hemoglobin level stable.  Polymicrobial left ankle osteomyelitis- -s/p debridement/hardware removal by Dr. Sharol Given on 10/25  -finished 6 weeks abx on 05/09/22 -finished linezolid 11/25-12/6 -Continue wound care service recommendation.  Chronic  respiratory failure with hypoxia and hypercarbia/COPD -on 3-4L at baseline -continue BDs as needed. -No wheezing or  signs of acute exacerbation currently. -Stable breathing. -Currently using 7-8 L of oxygen supplementation; chest x-ray 8 not demonstrating significant changes from admission images. -After discussing with nephrology we will attempt some diuresis and will follow response. -Continue to wean oxygen supplementation as tolerated -If there is any increased somnolence or concerns for obtundation will check ABG as discussed/ordered yesterday (06/21/2022).  Controlled DM2  -03/28/22 A1C--5.8 -hypoglycemic in am 1/16 -Continue sliding scale insulin. -Close monitoring of patient's CBGs especially while n.p.o. for procedure.  Gout -Not in flare -Continue colchicine   Chronic peripheral neuropathy/chronic back pain -Continue as needed judicious use of analgesics.   -Continue to hold gabapentin temporarily   Mood disorder -Overall stable-continue Prozac -Continue holding alprazolam temporarily, encouraged to minimize extra sedative and continue constant reorientation.   1.6 cm left thyroid nodule -Incidental finding on CT chest -Further work-up deferred to outpatient setting.   7 mm left lower lobe nodule -Incidental finding on CT chest. -Repeat CT chest 6-12 months.   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   Morbid Obesity: -Body mass index is 36.48 kg/m. -Low-calorie diet, portion control and increase physical activity discussed with patient.  Left leg wound -Continue to follow recommendation from wound care service -No signs of acute superimposed infection appreciated.   Family Communication:  no Family at bedside  Consultants:  renal  Code Status:  FULL   DVT Prophylaxis: SCDs.  Patient chronically on Eliquis; on hold in the setting of GI bleed.   Procedures: As Listed in Progress Note  Above  Antibiotics: Ceftriaxone 1/15>>   Subjective: No fever, no chest pain, no nausea, no vomiting, requiring 7 L nasal cannula supplementation and expressing good urine output.   Objective: Vitals:   06/22/22 2100 06/23/22 0503 06/23/22 0831 06/23/22 1302  BP: 131/87 (!) 141/93  (!) 121/106  Pulse: 87 92  94  Resp: 20 (!) 21    Temp: 98.9 F (37.2 C) 98.4 F (36.9 C)  97.9 F (36.6 C)  TempSrc: Oral     SpO2: 96% 97% 96% 90%  Weight:      Height:        Intake/Output Summary (Last 24 hours) at 06/23/2022 1725 Last data filed at 06/23/2022 1300 Gross per 24 hour  Intake 120 ml  Output 700 ml  Net -580 ml   Weight change:   Exam: General exam: Alert, awake, oriented x 3; tolerating clear liquid diet and without signs of overt bleeding.  Still requiring 7 L nasal cannula supplementation.  Decreased breath sounds at the bases and positive expiratory wheezing appreciated on exam. Respiratory system: No using accessory muscles; positive expiratory wheezing and decreased breath sounds at the bases Cardiovascular system:RRR. No rubs or gallops. Gastrointestinal system: Abdomen is obese, nondistended, soft and nontender. No organomegaly or masses felt. Normal bowel sounds heard. Central nervous system: Alert and oriented. No focal neurological deficits. Extremities/skin: No cyanosis or clubbing; 1+ edema appreciated bilaterally.  Positive chronic abscesses dermatitis and left lower extremity wound without signs of superimposed infection.  Clean dressings in place. Psychiatry: Judgement and insight appear normal. Mood & affect appropriate.     Data Reviewed: I have personally reviewed following labs and imaging studies  Basic Metabolic Panel: Recent Labs  Lab 06/18/22 2055 06/19/22 0555 06/20/22 0428 06/21/22 0346 06/22/22 0353 06/23/22 0439  NA  --  143 143 145 146* 145  K  --  3.9 3.7 3.8 3.4* 3.2*  CL  --  109 110 111 113* 112*  CO2  --  '23 24 25 23 23  '$ GLUCOSE   --  96 92 101* 82 116*  BUN  --  62* 56* 50* 45* 44*  CREATININE  --  4.91* 4.57* 4.42* 4.10* 3.98*  CALCIUM  --  8.1* 8.2* 8.4* 8.3* 8.2*  MG 2.3  --   --   --   --   --   PHOS  --   --  4.4 4.2 3.7 3.6   Liver Function Tests: Recent Labs  Lab 06/18/22 1917 06/20/22 0428 06/21/22 0346 06/22/22 0353 06/23/22 0439  AST 9*  --   --   --   --   ALT 8  --   --   --   --   ALKPHOS 65  --   --   --   --   BILITOT 0.7  --   --   --   --   PROT 6.1*  --   --   --   --   ALBUMIN 2.4* 2.4* 2.4* 2.4* 2.4*   Recent Labs  Lab 06/18/22 1917  AMMONIA <10   CBC: Recent Labs  Lab 06/18/22 1917 06/19/22 0010 06/19/22 0555 06/19/22 1138 06/21/22 1113 06/22/22 0353  WBC 7.8 9.0 8.7 9.7 10.8* 11.6*  NEUTROABS 6.6  --   --   --   --   --   HGB 7.8* 7.9* 8.1* 8.1* 8.5* 8.4*  HCT 26.8* 26.9* 27.8* 28.0* 28.6* 28.8*  MCV 100.0 98.9 99.3 100.0 98.3 98.6  PLT 244 262 274 276 240 240   CBG: Recent Labs  Lab 06/22/22 2128 06/22/22 2352 06/23/22 0741 06/23/22 1114 06/23/22 1622  GLUCAP 164* 139* 171* 168* 124*   Urine analysis:    Component Value Date/Time   COLORURINE YELLOW 06/18/2022 2027   APPEARANCEUR CLOUDY (A) 06/18/2022 2027   LABSPEC 1.010 06/18/2022 2027   PHURINE 5.0 06/18/2022 2027   GLUCOSEU NEGATIVE 06/18/2022 2027   HGBUR LARGE (A) 06/18/2022 2027   BILIRUBINUR NEGATIVE 06/18/2022 2027   KETONESUR NEGATIVE 06/18/2022 2027   PROTEINUR 30 (A) 06/18/2022 2027   UROBILINOGEN 0.2 03/04/2015 1835   NITRITE NEGATIVE 06/18/2022 2027   LEUKOCYTESUR LARGE (A) 06/18/2022 2027   Sepsis Labs:  Recent Results (from the past 240 hour(s))  Resp panel by RT-PCR (RSV, Flu A&B, Covid) Anterior Nasal Swab     Status: None   Collection Time: 06/18/22  7:13 PM   Specimen: Anterior Nasal Swab  Result Value Ref Range Status   SARS Coronavirus 2 by RT PCR NEGATIVE NEGATIVE Final    Comment: (NOTE) SARS-CoV-2 target nucleic acids are NOT DETECTED.  The SARS-CoV-2 RNA is  generally detectable in upper respiratory specimens during the acute phase of infection. The lowest concentration of SARS-CoV-2 viral copies this assay can detect is 138 copies/mL. A negative result does not preclude SARS-Cov-2 infection and should not be used as the sole basis for treatment or other patient management decisions. A negative result may occur with  improper specimen collection/handling, submission of specimen other than nasopharyngeal swab, presence of viral mutation(s) within the areas targeted by this assay, and inadequate number of viral copies(<138 copies/mL). A negative result must be combined with clinical observations, patient history, and epidemiological information. The expected result is Negative.  Fact Sheet for Patients:  EntrepreneurPulse.com.au  Fact Sheet for Healthcare Providers:  IncredibleEmployment.be  This test is no t yet approved or cleared by the [place] and  has been authorized for detection and/or diagnosis of SARS-CoV-2 by FDA under an Emergency Use Authorization (EUA). This EUA will remain  in effect (meaning this test can be used) for the duration of the COVID-19 declaration under Section 564(b)(1) of the Act, 21 U.S.C.section 360bbb-3(b)(1), unless the authorization is terminated  or revoked sooner.       Influenza A by PCR NEGATIVE NEGATIVE Final   Influenza B by PCR NEGATIVE NEGATIVE Final    Comment: (NOTE) The Xpert Xpress SARS-CoV-2/FLU/RSV plus assay is intended as an aid in the diagnosis of influenza from Nasopharyngeal swab specimens and should not be used as a sole basis for treatment. Nasal washings and aspirates are unacceptable for Xpert Xpress SARS-CoV-2/FLU/RSV testing.  Fact Sheet for Patients: EntrepreneurPulse.com.au  Fact Sheet for Healthcare Providers: IncredibleEmployment.be  This test is not yet approved or cleared by the [place] FDA and has been authorized for detection and/or diagnosis of SARS-CoV-2 by FDA under an Emergency Use Authorization (EUA). This EUA will remain in effect (meaning this test can be used) for the duration of the COVID-19 declaration under Section 564(b)(1) of the Act, 21 U.S.C. section 360bbb-3(b)(1), unless the authorization is terminated or revoked.     Resp Syncytial Virus by PCR NEGATIVE NEGATIVE Final    Comment: (NOTE) Fact Sheet for Patients: EntrepreneurPulse.com.au  Fact Sheet for Healthcare Providers: IncredibleEmployment.be  This test is not yet approved or cleared by the [place] FDA and has been authorized for detection and/or diagnosis of SARS-CoV-2 by FDA under an Emergency Use Authorization (EUA). This EUA will remain in effect (meaning this test can be used) for the duration of the COVID-19 declaration under Section 564(b)(1) of the Act, 21 U.S.C. section 360bbb-3(b)(1), unless the authorization is terminated or revoked.  Performed at [facility], [address], [place], [place] [address]   Urine Culture     Status: Abnormal   Collection Time: 06/18/22  8:27 PM   Specimen: Urine, Clean Catch  Result Value Ref Range Status   Specimen Description   Final    URINE, CLEAN CATCH Performed at [facility], [address], [place], [place] [address]    Special Requests   Final    NONE Performed at [facility], [address], [place], [place] [address]    Culture MULTIPLE SPECIES PRESENT, SUGGEST RECOLLECTION (A)  Final   Report Status 06/20/2022 FINAL  Final  MRSA Next Gen by PCR, Nasal     Status: Abnormal   Collection Time: 06/19/22  1:42 PM   Specimen: Nasal Mucosa; Nasal Swab  Result Value Ref Range Status   MRSA by PCR Next Gen DETECTED (A) NOT DETECTED Final    Comment: RESULT CALLED TO, READ BACK BY AND VERIFIED WITH: JENNIFER KING @ [phone] ON 06/20/22 C VARNER (NOTE) The GeneXpert MRSA Assay (FDA approved  for NASAL specimens only), is one component of a comprehensive MRSA colonization surveillance program. It is not intended to diagnose MRSA infection nor to guide or monitor treatment for MRSA infections. Test performance is not FDA approved in patients less than 17 years old. Performed at [facility], [address] [place], [place] [address]      Scheduled Meds:  amiodarone  200 mg Oral Daily   calcitRIOL  0.25 mcg Oral Daily   darbepoetin (ARANESP) injection - NON-DIALYSIS  300 mcg Subcutaneous Q Tue-1800   FLUoxetine  40 mg Oral Daily   fluticasone furoate-vilanterol  1 puff Inhalation Daily   furosemide  80 mg  Intravenous BID   insulin aspart  0-9 Units Subcutaneous Q4H   metoprolol tartrate  25 mg Oral BID   mupirocin ointment   Nasal BID   nystatin  5 mL Oral QID   nystatin   Topical TID   pantoprazole  40 mg Intravenous Q12H   potassium chloride  40 mEq Oral Once   umeclidinium bromide  1 puff Inhalation Daily   Continuous Infusions:  cefTRIAXone (ROCEPHIN)  IV 1 g (06/22/22 2035)   dextrose 5 % and 0.9% NaCl 50 mL/hr at 06/23/22 0419    Procedures/Studies: DG CHEST PORT 1 VIEW  Result Date: 06/21/2022 CLINICAL DATA:  Shortness of breath.  [id number] EXAM: PORTABLE CHEST 1 VIEW COMPARISON:  06/18/2022 FINDINGS: Cardiac enlargement with mild pulmonary vascular congestion. Linear scarring in the upper lungs. Diffuse interstitial pattern to the lungs is likely edema. Appearance is not significantly changed since prior study. No pleural effusions. No pneumothorax. Mediastinal contours appear intact. Calcification of the aorta. IMPRESSION: Cardiac enlargement with pulmonary vascular congestion and mild interstitial edema similar to prior study. Electronically Signed   By: Lucienne Capers M.D.   On: 06/21/2022 18:26   DG Chest 1 View  Result Date: 06/18/2022 CLINICAL DATA:  Altered mental status. EXAM: CHEST  1 VIEW COMPARISON:  Chest radiograph 04/12/2022, CT chest 04/12/2022  FINDINGS: The heart is enlarged.  The upper mediastinal contours are normal. Previously seen opacity in the left upper lobe has essentially resolved, with mild residual patchy opacity which may reflect residual scarring. There is no new or worsening focal airspace disease. There are diffusely increased interstitial markings possibly reflecting mild pulmonary interstitial edema. There is no significant pleural effusion. There is no pneumothorax. Note that the smaller nodular opacity seen on the prior chest CT are not seen on the current study. There is no acute osseous abnormality. IMPRESSION: 1. Cardiomegaly with possible mild pulmonary interstitial edema. 2. Essentially resolved airspace opacity in the left upper lobe with probable residual scarring. 3. Additional smaller nodules seen on the prior chest CT are not seen on the current study. Please refer to that study for follow-up recommendations. Electronically Signed   By: Valetta Mole M.D.   On: 06/18/2022 20:09   DG Tibia/Fibula Left  Result Date: 06/18/2022 CLINICAL DATA:  Left lower extremity erythema EXAM: LEFT TIBIA AND FIBULA - 2 VIEW COMPARISON:  03/22/2022 FINDINGS: Frontal and lateral views of the left tibia and fibula are obtained. There is a residual screw traversing the medial malleolus. K-wire within the medial malleolus and the plate and screw fixation across the distal fibula have been removed in the interim. New heterotopic ossification along the dorsal margin of the distal fibula. There are no acute displaced fractures. The bones are diffusely osteopenic. No evidence of bony destruction or periosteal reaction to suggest osteomyelitis. There is diffuse subcutaneous edema. IMPRESSION: 1. Diffuse subcutaneous edema consistent with cellulitis. 2. No radiographic evidence of osteomyelitis. 3. Diffuse osteopenia. 4. Revision of orthopedic hardware about the left ankle as above. Electronically Signed   By: Randa Ngo M.D.   On: 06/18/2022 20:06    CT Head Wo Contrast  Result Date: 06/18/2022 CLINICAL DATA:  Altered mental status EXAM: CT HEAD WITHOUT CONTRAST TECHNIQUE: Contiguous axial images were obtained from the base of the skull through the vertex without intravenous contrast. RADIATION DOSE REDUCTION: This exam was performed according to the departmental dose-optimization program which includes automated exposure control, adjustment of the mA and/or kV according to patient size and/or use of iterative  reconstruction technique. COMPARISON:  05/17/2019 FINDINGS: Brain: No mass, hemorrhage or extra-axial collection. There is periventricular hypoattenuation compatible with chronic microvascular disease. 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 Generalized volume loss. Vascular: Atherosclerotic calcification of the internal carotid and vertebral arteries at skull base. Skull: Normal. Negative for fracture or focal lesion. Sinuses/Orbits: No acute finding. Other: None. IMPRESSION: 1. No acute intracranial abnormality. 2. 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 Electronically Signed   By: Ulyses Jarred M.D.   On: 06/18/2022 19:50    Barton Dubois, MD  [facility]  If 7PM-7AM, please contact night-coverage [email or web address] Password [email or web address] 06/23/2022, 5:25 PM   LOS: 5 days

## 2022-06-24 ENCOUNTER — Telehealth (INDEPENDENT_AMBULATORY_CARE_PROVIDER_SITE_OTHER): Payer: Self-pay | Admitting: Gastroenterology

## 2022-06-24 DIAGNOSIS — G9341 Metabolic encephalopathy: Secondary | ICD-10-CM | POA: Diagnosis not present

## 2022-06-24 DIAGNOSIS — K921 Melena: Secondary | ICD-10-CM | POA: Diagnosis not present

## 2022-06-24 DIAGNOSIS — E1122 Type 2 diabetes mellitus with diabetic chronic kidney disease: Secondary | ICD-10-CM | POA: Diagnosis not present

## 2022-06-24 DIAGNOSIS — I4892 Unspecified atrial flutter: Secondary | ICD-10-CM | POA: Diagnosis not present

## 2022-06-24 LAB — RENAL FUNCTION PANEL
Albumin: 2.2 g/dL — ABNORMAL LOW (ref 3.5–5.0)
Anion gap: 8 (ref 5–15)
BUN: 39 mg/dL — ABNORMAL HIGH (ref 8–23)
CO2: 24 mmol/L (ref 22–32)
Calcium: 8.1 mg/dL — ABNORMAL LOW (ref 8.9–10.3)
Chloride: 112 mmol/L — ABNORMAL HIGH (ref 98–111)
Creatinine, Ser: 3.72 mg/dL — ABNORMAL HIGH (ref 0.61–1.24)
GFR, Estimated: 17 mL/min — ABNORMAL LOW (ref >60)
Glucose, Bld: 129 mg/dL — ABNORMAL HIGH (ref 70–99)
Phosphorus: 3.1 mg/dL (ref 2.5–4.6)
Potassium: 3 mmol/L — ABNORMAL LOW (ref 3.5–5.1)
Sodium: 144 mmol/L (ref 135–145)

## 2022-06-24 LAB — GLUCOSE, CAPILLARY
Glucose-Capillary: 131 mg/dL — ABNORMAL HIGH (ref 70–99)
Glucose-Capillary: 171 mg/dL — ABNORMAL HIGH (ref 70–99)
Glucose-Capillary: 180 mg/dL — ABNORMAL HIGH (ref 70–99)

## 2022-06-24 LAB — CBC
HCT: 27.8 % — ABNORMAL LOW (ref 39.0–52.0)
Hemoglobin: 8.2 g/dL — ABNORMAL LOW (ref 13.0–17.0)
MCH: 28.9 pg (ref 26.0–34.0)
MCHC: 29.5 g/dL — ABNORMAL LOW (ref 30.0–36.0)
MCV: 97.9 fL (ref 80.0–100.0)
Platelets: 197 10*3/uL (ref 150–400)
RBC: 2.84 MIL/uL — ABNORMAL LOW (ref 4.22–5.81)
RDW: 16.6 % — ABNORMAL HIGH (ref 11.5–15.5)
WBC: 13.3 10*3/uL — ABNORMAL HIGH (ref 4.0–10.5)
nRBC: 0.2 % (ref 0.0–0.2)

## 2022-06-24 MED ORDER — POTASSIUM CHLORIDE CRYS ER 20 MEQ PO TBCR
40.0000 meq | EXTENDED_RELEASE_TABLET | Freq: Once | ORAL | Status: AC
  Administered 2022-06-24: 40 meq via ORAL
  Filled 2022-06-24: qty 2

## 2022-06-24 MED ORDER — SACCHAROMYCES BOULARDII 250 MG PO CAPS
250.0000 mg | ORAL_CAPSULE | Freq: Two times a day (BID) | ORAL | Status: DC
  Administered 2022-06-24 – 2022-07-02 (×15): 250 mg via ORAL
  Filled 2022-06-24 (×16): qty 1

## 2022-06-24 NOTE — Progress Notes (Signed)
With afternoon vitals O2 was in low 80's on 5 liters.  Had to be bumped to 11 to get to 93. Contacted RT and was given neb treatment.  Dr. Dyann Kief contacted and said to aim for 88-89%.  Also,  patient had loose, slimy incontinent bm and Dr. Dyann Kief is ordering florastor. Continues to guard left arm and family said he caught arm in door twice at nursing home about one month ago and facility did xray and [place] which were negative. Family visited today.

## 2022-06-24 NOTE — Progress Notes (Signed)
Dressing to LLE changed as ordered. Soiled dressing removed and leg cleansed with NS and pat dry. Xeroform applied to superficial scattered areas to LLE. Aquacel applied to left outer leg wound that is deeper and contains purulent, foul odorous drainage. ABD pads applied to anterior and posterior LLE and wrapped with kerlex for support. LLE elevated on pillow.

## 2022-06-24 NOTE — Progress Notes (Signed)
Recheck of O2 and was 99% on 11 liters.  Bumped down to 5 and was 89-90%  Placed on tele with continuous pulse ox.

## 2022-06-24 NOTE — Telephone Encounter (Signed)
Hi Mitzie,  Can you please schedule a follow up appointment for this patient in 4 weeks with me or any of the APPs?  Thanks,  Maylon Peppers, MD Gastroenterology and Hepatology [facility]

## 2022-06-24 NOTE — Progress Notes (Signed)
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  The patient was recently hospitalized from 04/12/2022 to 04/20/2022 secondary to acute on chronic respiratory failure due to COPD exacerbation, pneumonia and acute on chronic HFpEF.  During that hospitalization, the patient also had acute on chronic renal failure.  He was felt to be hypervolemic and started on torsemide.   Per triage notes and ED provider who talked to staff at facility, at about 5 PM staff noticed that patient was confused.  Reported his blood sugars ranging in the 400s subsequently dropped to 68>> 130.  It is unknown if insulin was given when his blood sugars were in the 400s, a different staff had taken over patient's care.  Glucagon was given.  He has also had chronic left lower extremity pain which is unchanged. He also got his Lantus on the day of admission. In the ED, the patient was afebrile hemodynamically stable with oxygen saturation 92-95% on 4 L.  WBC 8.7, hemoglobin 8.1, platelets 274,000.  Sodium 143, potassium 3.9, bicarbonate 23, serum creatinine 5.02.   0.4 mg IV Narcan given, with some initial improvement in mental status, subsequent 0.8 mg Narcan given without significant response.  Patient was admitted for altered mental status and acute on chronic renal failure.   Assessment/Plan: Acute on chronic metabolic encephalopathy -Multifactorial including hypoglycemia, UTI, AKI, polypharmacy in the setting of impaired renal function -Mentation overall improved and  back to baseline now.   -CT brain negative -Continue gentle fluid resuscitation along with IV Lasix as recommended by nephrology service. -Continue holding gabapentin for now. -Continue constant reorientation.  UTI -UA>50 WBC -Continue ceftriaxone therapy -Cultures not helpful given multiple species isolated -Treated empirically with antibiotic therapy; has completed treatment on 06/23/2022.  (A total of 5 days IV antibiotics provided). -Patient denies dysuria at this point..  Acute on chronic renal failure--CKD stage IV -Baseline creatinine 3.3-3.7 -Presented with serum creatinine 5.02 -Nephrology consult appreciated -Creatinine trending down with fluid resuscitation and supportive care -Continue to minimize/avoid the use of nephrotoxic agents and follow creatinine. -No requiring hemodialysis at this point. -Creatinine 3.7 today. -Continue to follow creatinine trend.  Melena/FOBT positive -GI consultation appreciated. -holding apixaban temporarily -Continue NPO status. -had melena in ED at time of admission.  No further overt bleeding appreciated. -Continue PPI. -Status post endoscopy on 06/21/2022 without evidence of acute bleeding source. -Planning for colonoscopy once respiratory status further improved; continue clear liquid diet. -Will await clearance by GI for anticoagulation resumption.  Paroxysmal atrial flutter -In and out of A-fib/flutter -Continue telemetry monitoring -Continue the use of amiodarone and metoprolol -Rate appears to be stable and well-controlled currently -Continue holding anticoagulation in the setting of melanotic stools and positive fecal occult blood test.  Will follow clearance by GI service in order to resume anticoagulation. -Plan for endoscopy later today. -No operative bleeding; Stable hemoglobin level. -Tolerating clear liquid diet.  Polymicrobial left ankle osteomyelitis- -s/p debridement/hardware removal by Dr. Sharol Given on 10/25  -finished  6 weeks abx on 05/09/22 -finished linezolid 11/25-12/6 -Continue wound care service recommendation.  Chronic respiratory failure with hypoxia and  hypercarbia/COPD -on 3-4L at baseline. -continue BDs as needed. -No wheezing or signs of acute exacerbation currently. -Stable breathing. -Currently using 7-8 L of oxygen supplementation; chest x-ray 8 not demonstrating significant changes from admission images. -After discussing with nephrology we will attempt some diuresis and will follow response. -Continue to wean oxygen supplementation as tolerated -If there is any increased somnolence or concerns for obtundation will check ABG as discussed/ordered yesterday (06/21/2022). -Patient down to 5 L nasal cannula supplementation today and expressing overall improvement in his breathing status.  Controlled DM2  -03/28/22 A1C--5.8 -hypoglycemic in am 1/16 -Continue sliding scale insulin. -Close monitoring of patient's CBGs especially while n.p.o. for procedure.  Gout -Not in flare -Continue colchicine   Chronic peripheral neuropathy/chronic back pain -Continue as needed judicious use of analgesics.   -Continue to hold gabapentin temporarily   Mood disorder -Overall stable-continue Prozac -Continue holding alprazolam temporarily, encouraged to minimize extra sedative and continue constant reorientation.   1.6 cm left thyroid nodule -Incidental finding on CT chest -Further work-up deferred to outpatient setting.   7 mm left lower lobe nodule -Incidental finding on CT chest. -Repeat CT chest 6-12 months.   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   Morbid Obesity: -Body mass index is 36.48 kg/m. -Low-calorie diet, portion control and increase physical activity discussed with patient.  Left leg wound -Continue to follow recommendation from wound care service -No signs of acute superimposed infection  appreciated.   Family Communication:  no Family at bedside  Consultants:  renal  Code Status:  FULL   DVT Prophylaxis: SCDs.  Patient chronically on Eliquis; on hold in the setting of GI bleed.   Procedures: As Listed in Progress Note Above  Antibiotics: Ceftriaxone 1/15>>06/23/22   Subjective: Afebrile, no chest pain, no nausea, no vomiting.  Requiring 5 L nasal cannula supplementation shortness of breath, chest pain or palpitations.  No overt bleeding overnight.  Good urine output reported.   Objective: Vitals:   06/24/22 0600 06/24/22 0811 06/24/22 1507 06/24/22 1547  BP: (!) 150/66  132/89   Pulse: 93  76   Resp: 19  20   Temp: 97.9 F (36.6 C)  98 F (36.7 C)   TempSrc: Oral  Oral   SpO2: 94% 96% 93% 92%  Weight:      Height:        Intake/Output Summary (Last 24 hours) at 06/24/2022 1735 Last data filed at 06/24/2022 1614 Gross per 24 hour  Intake 3861.35 ml  Output 2350 ml  Net 1511.35 ml   Weight change:   Exam: General exam: Alert, awake, oriented x 3; in no acute distress; reporting increased urine output and his oxygen supplementation down to 5 L.  No overt bleeding reported. Respiratory system: Improved from movement bilaterally; decreased breath sounds at the bases without frank crackles; mild expiratory wheezing appreciated on exam. Cardiovascular system:RRR. No rubs or gallops; no JVD. Gastrointestinal system: Abdomen is obese, nondistended, soft and nontender. No organomegaly or masses felt. Normal bowel sounds heard. Central nervous system: Alert and oriented. No focal neurological deficits. Extremities: No cyanosis or clubbing; chronic stasis dermatitis and 1-2+ edema appreciated bilaterally.  Left lower extremity with clean dressings in place from chronic wound. Skin: No petechiae. Psychiatry: Judgement and insight appear normal. Mood & affect appropriate.   Data Reviewed: I have personally reviewed following labs and imaging studies  Basic  Metabolic Panel: Recent Labs  Lab 06/18/22 2055 06/19/22 0555 06/20/22 8003  06/21/22 0346 06/22/22 0353 06/23/22 0439 06/24/22 0503  NA  --    < > 143 145 146* 145 144  K  --    < > 3.7 3.8 3.4* 3.2* 3.0*  CL  --    < > 110 111 113* 112* 112*  CO2  --    < > '24 25 23 23 24  '$ GLUCOSE  --    < > 92 101* 82 116* 129*  BUN  --    < > 56* 50* 45* 44* 39*  CREATININE  --    < > 4.57* 4.42* 4.10* 3.98* 3.72*  CALCIUM  --    < > 8.2* 8.4* 8.3* 8.2* 8.1*  MG 2.3  --   --   --   --   --   --   PHOS  --   --  4.4 4.2 3.7 3.6 3.1   < > = values in this interval not displayed.   Liver Function Tests: Recent Labs  Lab 06/18/22 1917 06/20/22 0428 06/21/22 0346 06/22/22 0353 06/23/22 0439 06/24/22 0503  AST 9*  --   --   --   --   --   ALT 8  --   --   --   --   --   ALKPHOS 65  --   --   --   --   --   BILITOT 0.7  --   --   --   --   --   PROT 6.1*  --   --   --   --   --   ALBUMIN 2.4* 2.4* 2.4* 2.4* 2.4* 2.2*   Recent Labs  Lab 06/18/22 1917  AMMONIA <10   CBC: Recent Labs  Lab 06/18/22 1917 06/19/22 0010 06/19/22 0555 06/19/22 1138 06/21/22 1113 06/22/22 0353 06/24/22 0503  WBC 7.8   < > 8.7 9.7 10.8* 11.6* 13.3*  NEUTROABS 6.6  --   --   --   --   --   --   HGB 7.8*   < > 8.1* 8.1* 8.5* 8.4* 8.2*  HCT 26.8*   < > 27.8* 28.0* 28.6* 28.8* 27.8*  MCV 100.0   < > 99.3 100.0 98.3 98.6 97.9  PLT 244   < > 274 276 240 240 197   < > = values in this interval not displayed.   CBG: Recent Labs  Lab 06/23/22 1955 06/23/22 2320 06/24/22 0447 06/24/22 1148 06/24/22 1647  GLUCAP 160* 119* 131* 171* 180*   Urine analysis:    Component Value Date/Time   COLORURINE YELLOW 06/18/2022 2027   APPEARANCEUR CLOUDY (A) 06/18/2022 2027   LABSPEC 1.010 06/18/2022 2027   PHURINE 5.0 06/18/2022 2027   GLUCOSEU NEGATIVE 06/18/2022 2027   HGBUR LARGE (A) 06/18/2022 2027   BILIRUBINUR NEGATIVE 06/18/2022 2027   KETONESUR NEGATIVE 06/18/2022 2027   PROTEINUR 30 (A) 06/18/2022  2027   UROBILINOGEN 0.2 03/04/2015 1835   NITRITE NEGATIVE 06/18/2022 2027   LEUKOCYTESUR LARGE (A) 06/18/2022 2027   Sepsis Labs:  Recent Results (from the past 240 hour(s))  Resp panel by RT-PCR (RSV, Flu A&B, Covid) Anterior Nasal Swab     Status: None   Collection Time: 06/18/22  7:13 PM   Specimen: Anterior Nasal Swab  Result Value Ref Range Status   SARS Coronavirus 2 by RT PCR NEGATIVE NEGATIVE Final    Comment: (NOTE) SARS-CoV-2 target nucleic acids are NOT DETECTED.  The SARS-CoV-2 RNA is generally detectable in  upper respiratory specimens during the acute phase of infection. The lowest concentration of SARS-CoV-2 viral copies this assay can detect is 138 copies/mL. A negative result does not preclude SARS-Cov-2 infection and should not be used as the sole basis for treatment or other patient management decisions. A negative result may occur with  improper specimen collection/handling, submission of specimen other than nasopharyngeal swab, presence of viral mutation(s) within the areas targeted by this assay, and inadequate number of viral copies(<138 copies/mL). A negative result must be combined with clinical observations, patient history, and epidemiological information. The expected result is Negative.  Fact Sheet for Patients:  EntrepreneurPulse.com.au  Fact Sheet for Healthcare Providers:  IncredibleEmployment.be  This test is no t yet approved or cleared by the [place] FDA and  has been authorized for detection and/or diagnosis of SARS-CoV-2 by FDA under an Emergency Use Authorization (EUA). This EUA will remain  in effect (meaning this test can be used) for the duration of the COVID-19 declaration under Section 564(b)(1) of the Act, 21 U.S.C.section 360bbb-3(b)(1), unless the authorization is terminated  or revoked sooner.       Influenza A by PCR NEGATIVE NEGATIVE Final   Influenza B by PCR NEGATIVE NEGATIVE  Final    Comment: (NOTE) The Xpert Xpress SARS-CoV-2/FLU/RSV plus assay is intended as an aid in the diagnosis of influenza from Nasopharyngeal swab specimens and should not be used as a sole basis for treatment. Nasal washings and aspirates are unacceptable for Xpert Xpress SARS-CoV-2/FLU/RSV testing.  Fact Sheet for Patients: EntrepreneurPulse.com.au  Fact Sheet for Healthcare Providers: IncredibleEmployment.be  This test is not yet approved or cleared by the [place] FDA and has been authorized for detection and/or diagnosis of SARS-CoV-2 by FDA under an Emergency Use Authorization (EUA). This EUA will remain in effect (meaning this test can be used) for the duration of the COVID-19 declaration under Section 564(b)(1) of the Act, 21 U.S.C. section 360bbb-3(b)(1), unless the authorization is terminated or revoked.     Resp Syncytial Virus by PCR NEGATIVE NEGATIVE Final    Comment: (NOTE) Fact Sheet for Patients: EntrepreneurPulse.com.au  Fact Sheet for Healthcare Providers: IncredibleEmployment.be  This test is not yet approved or cleared by the [place] FDA and has been authorized for detection and/or diagnosis of SARS-CoV-2 by FDA under an Emergency Use Authorization (EUA). This EUA will remain in effect (meaning this test can be used) for the duration of the COVID-19 declaration under Section 564(b)(1) of the Act, 21 U.S.C. section 360bbb-3(b)(1), unless the authorization is terminated or revoked.  Performed at [facility], [address] [place], [place] [address]   Urine Culture     Status: Abnormal   Collection Time: 06/18/22  8:27 PM   Specimen: Urine, Clean Catch  Result Value Ref Range Status   Specimen Description   Final    URINE, CLEAN CATCH Performed at [facility], [address] [place], [place] [address]    Special Requests   Final    NONE Performed at [facility], [address] [place], [place] [address]    Culture MULTIPLE SPECIES PRESENT, SUGGEST RECOLLECTION (A)  Final   Report Status 06/20/2022 FINAL  Final  MRSA Next Gen by PCR, Nasal     Status: Abnormal   Collection Time: 06/19/22  1:42 PM   Specimen: Nasal Mucosa; Nasal Swab  Result Value Ref Range Status   MRSA by PCR Next Gen DETECTED (A) NOT DETECTED Final    Comment: RESULT CALLED TO, READ  BACK BY AND VERIFIED WITH: Jeanmarie Plant @ [phone] ON 06/20/22 C VARNER (NOTE) The GeneXpert MRSA Assay (FDA approved for NASAL specimens only), is one component of a comprehensive MRSA colonization surveillance program. It is not intended to diagnose MRSA infection nor to guide or monitor treatment for MRSA infections. Test performance is not FDA approved in patients less than 77 years old. Performed at [facility], [address] [place], [place] [address]      Scheduled Meds:  amiodarone  200 mg Oral Daily   calcitRIOL  0.25 mcg Oral Daily   darbepoetin (ARANESP) injection - NON-DIALYSIS  300 mcg Subcutaneous Q Tue-1800   FLUoxetine  40 mg Oral Daily   fluticasone furoate-vilanterol  1 puff Inhalation Daily   furosemide  80 mg Intravenous BID   insulin aspart  0-9 Units Subcutaneous Q4H   metoprolol tartrate  25 mg Oral BID   mupirocin ointment   Nasal BID   nystatin  5 mL Oral QID   nystatin   Topical TID   mouth rinse  15 mL Mouth Rinse 4 times per day   pantoprazole  40 mg Intravenous Q12H   saccharomyces boulardii  250 mg Oral BID   umeclidinium bromide  1 puff Inhalation Daily   Continuous Infusions:  cefTRIAXone (ROCEPHIN)  IV Stopped (06/23/22 2031)   dextrose 5 % and 0.9% NaCl 50 mL/hr at 06/24/22 0300    Procedures/Studies: DG CHEST PORT 1 VIEW  Result Date: 06/21/2022 CLINICAL DATA:  Shortness of breath.  [id number] EXAM: PORTABLE CHEST 1 VIEW COMPARISON:  06/18/2022 FINDINGS: Cardiac enlargement with mild pulmonary vascular congestion. Linear scarring in the upper lungs.  Diffuse interstitial pattern to the lungs is likely edema. Appearance is not significantly changed since prior study. No pleural effusions. No pneumothorax. Mediastinal contours appear intact. Calcification of the aorta. IMPRESSION: Cardiac enlargement with pulmonary vascular congestion and mild interstitial edema similar to prior study. Electronically Signed   By: Lucienne Capers M.D.   On: 06/21/2022 18:26   DG Chest 1 View  Result Date: 06/18/2022 CLINICAL DATA:  Altered mental status. EXAM: CHEST  1 VIEW COMPARISON:  Chest radiograph 04/12/2022, CT chest 04/12/2022 FINDINGS: The heart is enlarged.  The upper mediastinal contours are normal. Previously seen opacity in the left upper lobe has essentially resolved, with mild residual patchy opacity which may reflect residual scarring. There is no new or worsening focal airspace disease. There are diffusely increased interstitial markings possibly reflecting mild pulmonary interstitial edema. There is no significant pleural effusion. There is no pneumothorax. Note that the smaller nodular opacity seen on the prior chest CT are not seen on the current study. There is no acute osseous abnormality. IMPRESSION: 1. Cardiomegaly with possible mild pulmonary interstitial edema. 2. Essentially resolved airspace opacity in the left upper lobe with probable residual scarring. 3. Additional smaller nodules seen on the prior chest CT are not seen on the current study. Please refer to that study for follow-up recommendations. Electronically Signed   By: Valetta Mole M.D.   On: 06/18/2022 20:09   DG Tibia/Fibula Left  Result Date: 06/18/2022 CLINICAL DATA:  Left lower extremity erythema EXAM: LEFT TIBIA AND FIBULA - 2 VIEW COMPARISON:  03/22/2022 FINDINGS: Frontal and lateral views of the left tibia and fibula are obtained. There is a residual screw traversing the medial malleolus. K-wire within the medial malleolus and the plate and screw fixation across the distal  fibula have been removed in the interim. New heterotopic ossification along the dorsal  margin of the distal fibula. There are no acute displaced fractures. The bones are diffusely osteopenic. No evidence of bony destruction or periosteal reaction to suggest osteomyelitis. There is diffuse subcutaneous edema. IMPRESSION: 1. Diffuse subcutaneous edema consistent with cellulitis. 2. No radiographic evidence of osteomyelitis. 3. Diffuse osteopenia. 4. Revision of orthopedic hardware about the left ankle as above. Electronically Signed   By: Randa Ngo M.D.   On: 06/18/2022 20:06   CT Head Wo Contrast  Result Date: 06/18/2022 CLINICAL DATA:  Altered mental status EXAM: CT HEAD WITHOUT CONTRAST TECHNIQUE: Contiguous axial images were obtained from the base of the skull through the vertex without intravenous contrast. RADIATION DOSE REDUCTION: This exam was performed according to the departmental dose-optimization program which includes automated exposure control, adjustment of the mA and/or kV according to patient size and/or use of iterative reconstruction technique. COMPARISON:  05/17/2019 FINDINGS: Brain: No mass, hemorrhage or extra-axial collection. There is periventricular hypoattenuation compatible with chronic microvascular disease. 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 Generalized volume loss. Vascular: Atherosclerotic calcification of the internal carotid and vertebral arteries at skull base. Skull: Normal. Negative for fracture or focal lesion. Sinuses/Orbits: No acute finding. Other: None. IMPRESSION: 1. No acute intracranial abnormality. 2. 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 Electronically Signed   By: Ulyses Jarred M.D.   On: 06/18/2022 19:50    Barton Dubois, MD  [facility]  If 7PM-7AM, please contact night-coverage [email or web address] Password [facility] 06/24/2022, 5:35 PM   LOS: 6 days

## 2022-06-25 ENCOUNTER — Inpatient Hospital Stay (HOSPITAL_COMMUNITY): Payer: Medicare (Managed Care)

## 2022-06-25 DIAGNOSIS — G9341 Metabolic encephalopathy: Secondary | ICD-10-CM | POA: Diagnosis not present

## 2022-06-25 DIAGNOSIS — I4892 Unspecified atrial flutter: Secondary | ICD-10-CM | POA: Diagnosis not present

## 2022-06-25 DIAGNOSIS — E1122 Type 2 diabetes mellitus with diabetic chronic kidney disease: Secondary | ICD-10-CM | POA: Diagnosis not present

## 2022-06-25 DIAGNOSIS — K921 Melena: Secondary | ICD-10-CM | POA: Diagnosis not present

## 2022-06-25 LAB — RENAL FUNCTION PANEL
Albumin: 2.1 g/dL — ABNORMAL LOW (ref 3.5–5.0)
Anion gap: 7 (ref 5–15)
BUN: 34 mg/dL — ABNORMAL HIGH (ref 8–23)
CO2: 24 mmol/L (ref 22–32)
Calcium: 7.6 mg/dL — ABNORMAL LOW (ref 8.9–10.3)
Chloride: 109 mmol/L (ref 98–111)
Creatinine, Ser: 3.5 mg/dL — ABNORMAL HIGH (ref 0.61–1.24)
GFR, Estimated: 18 mL/min — ABNORMAL LOW (ref >60)
Glucose, Bld: 115 mg/dL — ABNORMAL HIGH (ref 70–99)
Phosphorus: 2.7 mg/dL (ref 2.5–4.6)
Potassium: 2.8 mmol/L — ABNORMAL LOW (ref 3.5–5.1)
Sodium: 140 mmol/L (ref 135–145)

## 2022-06-25 LAB — GLUCOSE, CAPILLARY
Glucose-Capillary: 110 mg/dL — ABNORMAL HIGH (ref 70–99)
Glucose-Capillary: 111 mg/dL — ABNORMAL HIGH (ref 70–99)
Glucose-Capillary: 110 mg/dL — ABNORMAL HIGH (ref 70–99)
Glucose-Capillary: 136 mg/dL — ABNORMAL HIGH (ref 70–99)
Glucose-Capillary: 137 mg/dL — ABNORMAL HIGH (ref 70–99)
Glucose-Capillary: 162 mg/dL — ABNORMAL HIGH (ref 70–99)

## 2022-06-25 LAB — TROPONIN I (HIGH SENSITIVITY): Troponin I (High Sensitivity): 29 ng/L — ABNORMAL HIGH (ref <18)

## 2022-06-25 LAB — BLOOD GAS, ARTERIAL
Acid-Base Excess: 0.1 mmol/L (ref 0.0–2.0)
Bicarbonate: 24.7 mmol/L (ref 20.0–28.0)
Drawn by: 38235
FIO2: 100 %
O2 Saturation: 99.9 %
Patient temperature: 37
pCO2 arterial: 39 mmHg (ref 32–48)
pH, Arterial: 7.41 (ref 7.35–7.45)
pO2, Arterial: 209 mmHg — ABNORMAL HIGH (ref 83–108)

## 2022-06-25 LAB — COMPREHENSIVE METABOLIC PANEL
ALT: 9 U/L (ref 0–44)
AST: 15 U/L (ref 15–41)
Albumin: 2.3 g/dL — ABNORMAL LOW (ref 3.5–5.0)
Alkaline Phosphatase: 64 U/L (ref 38–126)
Anion gap: 10 (ref 5–15)
BUN: 32 mg/dL — ABNORMAL HIGH (ref 8–23)
CO2: 23 mmol/L (ref 22–32)
Calcium: 7.7 mg/dL — ABNORMAL LOW (ref 8.9–10.3)
Chloride: 106 mmol/L (ref 98–111)
Creatinine, Ser: 3.52 mg/dL — ABNORMAL HIGH (ref 0.61–1.24)
GFR, Estimated: 18 mL/min — ABNORMAL LOW (ref >60)
Glucose, Bld: 153 mg/dL — ABNORMAL HIGH (ref 70–99)
Potassium: 3.3 mmol/L — ABNORMAL LOW (ref 3.5–5.1)
Sodium: 139 mmol/L (ref 135–145)
Total Bilirubin: 0.8 mg/dL (ref 0.3–1.2)
Total Protein: 6.8 g/dL (ref 6.5–8.1)

## 2022-06-25 MED ORDER — APIXABAN 5 MG PO TABS
5.0000 mg | ORAL_TABLET | Freq: Two times a day (BID) | ORAL | Status: DC
  Administered 2022-06-25 – 2022-06-27 (×4): 5 mg via ORAL
  Filled 2022-06-25 (×4): qty 1

## 2022-06-25 MED ORDER — FUROSEMIDE 10 MG/ML IJ SOLN
80.0000 mg | Freq: Every day | INTRAMUSCULAR | Status: DC
  Administered 2022-06-26 – 2022-06-27 (×2): 80 mg via INTRAVENOUS
  Filled 2022-06-25 (×3): qty 8

## 2022-06-25 MED ORDER — FUROSEMIDE 10 MG/ML IJ SOLN
60.0000 mg | Freq: Once | INTRAMUSCULAR | Status: AC
  Administered 2022-06-26: 60 mg via INTRAVENOUS
  Filled 2022-06-25: qty 6

## 2022-06-25 MED ORDER — METOPROLOL TARTRATE 5 MG/5ML IV SOLN
INTRAVENOUS | Status: AC
  Filled 2022-06-25: qty 5

## 2022-06-25 MED ORDER — METOPROLOL TARTRATE 5 MG/5ML IV SOLN
5.0000 mg | Freq: Once | INTRAVENOUS | Status: AC
  Administered 2022-06-25: 5 mg via INTRAVENOUS

## 2022-06-25 MED ORDER — POTASSIUM CHLORIDE CRYS ER 20 MEQ PO TBCR
40.0000 meq | EXTENDED_RELEASE_TABLET | ORAL | Status: AC
  Administered 2022-06-25 (×2): 40 meq via ORAL
  Filled 2022-06-25 (×2): qty 2

## 2022-06-25 MED ORDER — POTASSIUM CHLORIDE 20 MEQ PO PACK
40.0000 meq | PACK | Freq: Once | ORAL | Status: AC
  Administered 2022-06-26: 40 meq via ORAL
  Filled 2022-06-25: qty 2

## 2022-06-25 NOTE — TOC Progression Note (Signed)
Transition of Care [facility]) - Progression Note    Patient Details  Name: Eric Scott MRN: [id number] Date of Birth: [birth year]  [facility] CM/SW Contact  Ihor Gully, LCSW Phone Number: 06/25/2022, 1:49 PM  Clinical Narrative:    Jackelyn Poling with [facility] advised that patient may d/c back to the facility tomorrow.    Expected Discharge Plan: [place] Barriers to Discharge: Continued Medical Work up  Expected Discharge Plan and [place] arrangements for the past 2 months: [place]                                       Social Determinants of Health (SDOH) Interventions SDOH Screenings   Food Insecurity: No Food Insecurity (06/19/2022)  Housing: Low Risk  (06/19/2022)  Transportation Needs: No Transportation Needs (06/19/2022)  Utilities: Not At Risk (06/19/2022)  Depression (PHQ2-9): Low Risk  (04/24/2022)  Tobacco Use: High Risk (06/21/2022)    Readmission Risk Interventions     No data to display

## 2022-06-25 NOTE — Progress Notes (Signed)
Patient ID: Eric Scott, male   DOB: Apr 29, 1951, 72 y.o.   MRN: [id number] S: Had increased oxygen demands overnight but feeling better this morning. O:BP (!) 146/81 (BP Location: Right Arm)   Pulse 73   Temp 98.4 F (36.9 C) (Oral)   Resp 20   Ht 6' (1.829 m)   Wt 122 kg   SpO2 91%   BMI 36.48 kg/m   Intake/Output Summary (Last 24 hours) at 06/25/2022 0918 Last data filed at 06/25/2022 0739 Gross per 24 hour  Intake 2887.51 ml  Output 2050 ml  Net 837.51 ml   Intake/Output: I/O last 3 completed shifts: In: 6204.7 [P.O.:2520; I.V.:3284.7; IV Piggyback:400] Out: 3800 [Urine:3800]  Intake/Output this shift:  Total I/O In: 144.2 [I.V.:144.2] Out: -  Weight change:  Gen: NAD CVS: RRR  Resp:CTA Abd: +BS, soft, NT/ND Ext: 1+ pitting edema ble, LLE wrapped.  Recent Labs  Lab 06/18/22 1917 06/19/22 0555 06/20/22 0428 06/21/22 0346 06/22/22 0353 06/23/22 0439 06/24/22 0503 06/25/22 0514  NA 142 143 143 145 146* 145 144 140  K 3.7 3.9 3.7 3.8 3.4* 3.2* 3.0* 2.8*  CL 110 109 110 111 113* 112* 112* 109  CO2 '23 23 24 25 23 23 24 24  '$ GLUCOSE 92 96 92 101* 82 116* 129* 115*  BUN 65* 62* 56* 50* 45* 44* 39* 34*  CREATININE 5.02* 4.91* 4.57* 4.42* 4.10* 3.98* 3.72* 3.50*  ALBUMIN 2.4*  --  2.4* 2.4* 2.4* 2.4* 2.2* 2.1*  CALCIUM 7.9* 8.1* 8.2* 8.4* 8.3* 8.2* 8.1* 7.6*  PHOS  --   --  4.4 4.2 3.7 3.6 3.1 2.7  AST 9*  --   --   --   --   --   --   --   ALT 8  --   --   --   --   --   --   --    Liver Function Tests: Recent Labs  Lab 06/18/22 1917 06/20/22 0428 06/23/22 0439 06/24/22 0503 06/25/22 0514  AST 9*  --   --   --   --   ALT 8  --   --   --   --   ALKPHOS 65  --   --   --   --   BILITOT 0.7  --   --   --   --   PROT 6.1*  --   --   --   --   ALBUMIN 2.4*   < > 2.4* 2.2* 2.1*   < > = values in this interval not displayed.   No results for input(s): "LIPASE", "AMYLASE" in the last 168 hours. Recent Labs  Lab 06/18/22 1917  AMMONIA <10   CBC: Recent  Labs  Lab 06/18/22 1917 06/19/22 0010 06/19/22 0555 06/19/22 1138 06/21/22 1113 06/22/22 0353 06/24/22 0503  WBC 7.8   < > 8.7 9.7 10.8* 11.6* 13.3*  NEUTROABS 6.6  --   --   --   --   --   --   HGB 7.8*   < > 8.1* 8.1* 8.5* 8.4* 8.2*  HCT 26.8*   < > 27.8* 28.0* 28.6* 28.8* 27.8*  MCV 100.0   < > 99.3 100.0 98.3 98.6 97.9  PLT 244   < > 274 276 240 240 197   < > = values in this interval not displayed.   Cardiac Enzymes: No results for input(s): "CKTOTAL", "CKMB", "CKMBINDEX", "TROPONINI" in the last 168 hours. CBG: Recent Labs  Lab  06/24/22 0447 06/24/22 1148 06/24/22 1647 06/25/22 0442 06/25/22 0741  GLUCAP 131* 171* 180* 110* 111*    Iron Studies: No results for input(s): "IRON", "TIBC", "TRANSFERRIN", "FERRITIN" in the last 72 hours. Studies/Results: No results found.  amiodarone  200 mg Oral Daily   calcitRIOL  0.25 mcg Oral Daily   darbepoetin (ARANESP) injection - NON-DIALYSIS  300 mcg Subcutaneous Q Tue-1800   FLUoxetine  40 mg Oral Daily   fluticasone furoate-vilanterol  1 puff Inhalation Daily   furosemide  80 mg Intravenous Daily   insulin aspart  0-9 Units Subcutaneous Q4H   metoprolol tartrate  25 mg Oral BID   mupirocin ointment   Nasal BID   nystatin  5 mL Oral QID   nystatin   Topical TID   mouth rinse  15 mL Mouth Rinse 4 times per day   pantoprazole  40 mg Intravenous Q12H   potassium chloride  40 mEq Oral Q4H   saccharomyces boulardii  250 mg Oral BID   umeclidinium bromide  1 puff Inhalation Daily    BMET    Component Value Date/Time   NA 140 06/25/2022 0514   K 2.8 (L) 06/25/2022 0514   CL 109 06/25/2022 0514   CO2 24 06/25/2022 0514   GLUCOSE 115 (H) 06/25/2022 0514   BUN 34 (H) 06/25/2022 0514   CREATININE 3.50 (H) 06/25/2022 0514   CALCIUM 7.6 (L) 06/25/2022 0514   CALCIUM 9.2 01/31/2021 1311   GFRNONAA 18 (L) 06/25/2022 0514   GFRAA 16 (L) 12/21/2019 0633   CBC    Component Value Date/Time   WBC 13.3 (H) 06/24/2022 0503    RBC 2.84 (L) 06/24/2022 0503   HGB 8.2 (L) 06/24/2022 0503   HCT 27.8 (L) 06/24/2022 0503   PLT 197 06/24/2022 0503   MCV 97.9 06/24/2022 0503   MCH 28.9 06/24/2022 0503   MCHC 29.5 (L) 06/24/2022 0503   RDW 16.6 (H) 06/24/2022 0503   LYMPHSABS 0.4 (L) 06/18/2022 1917   MONOABS 0.4 06/18/2022 1917   EOSABS 0.3 06/18/2022 1917   BASOSABS 0.0 06/18/2022 1917     Assessment/Plan:  AKI/CKD stage IV - Scr now back at baseline.  Given worsening oxygen requirements, IVF's stopped and give IV lasix 80 mg with marked increase in UOP.  No indication for dialysis at this time.  Will continue to follow.   Acute on chronic metabolic encephalopathy - multifactorial with AKI, UTI, and polypharmacy.  Improving mentation. UTI - treated with ceftriaxone per primary ABLA on anemia of CKD stage IV - FOBT+, apixaban on hold.  Gi consulted due to melena.  EGD negative for acute bleed and colonoscopy on hold due ot respiratory status. Atrial flutter - on amiodarone and metoprolol.  Off of Eliquis for now due to melena and ABLA COPD - continue with nebulizers. HTN/volume  - stable and diuresing well. DM - per primary  Donetta Potts, MD [facility] [phone]

## 2022-06-25 NOTE — Progress Notes (Signed)
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  The patient was recently hospitalized from 04/12/2022 to 04/20/2022 secondary to acute on chronic respiratory failure due to COPD exacerbation, pneumonia and acute on chronic HFpEF.  During that hospitalization, the patient also had acute on chronic renal failure.  He was felt to be hypervolemic and started on torsemide.   Per triage notes and ED provider who talked to staff at facility, at about 5 PM staff noticed that patient was confused.  Reported his blood sugars ranging in the 400s subsequently dropped to 68>> 130.  It is unknown if insulin was given when his blood sugars were in the 400s, a different staff had taken over patient's care.  Glucagon was given.  He has also had chronic left lower extremity pain which is unchanged. He also got his Lantus on the day of admission. In the ED, the patient was afebrile hemodynamically stable with oxygen saturation 92-95% on 4 L.  WBC 8.7, hemoglobin 8.1, platelets 274,000.  Sodium 143, potassium 3.9, bicarbonate 23, serum creatinine 5.02.   0.4 mg IV Narcan given, with some initial improvement in mental status, subsequent 0.8 mg Narcan given without significant response.  Patient was admitted for altered mental status and acute on chronic renal failure.   Assessment/Plan: Acute on chronic metabolic encephalopathy -Multifactorial including hypoglycemia, UTI, AKI, polypharmacy in the setting of impaired renal function -Mentation overall improved and  back to baseline now.   -CT brain negative -Continue gentle fluid resuscitation along with IV Lasix as recommended by nephrology service. -Continue holding gabapentin for now. -Continue constant reorientation.  UTI -UA>50 WBC -Continue ceftriaxone therapy -Cultures not helpful given multiple species isolated -Treated empirically with antibiotic therapy; has completed treatment on 06/23/2022.  (A total of 5 days IV antibiotics provided). -Patient denies dysuria at this point..  Acute on chronic renal failure--CKD stage IV -Baseline creatinine 3.3-3.7 -Presented with serum creatinine 5.02 -Nephrology consult appreciated -IV fluids to be discontinued; continue the use of IV Lasix 80 mg once a day for now while monitoring urine output and renal function trend. -Continue to minimize/avoid the use of nephrotoxic agents and follow creatinine. -No requiring hemodialysis at this point. -Creatinine 3.5 today (back to baseline)..  Melena/FOBT positive -GI consultation appreciated. -holding apixaban temporarily -Continue NPO status. -had melena in ED at time of admission.  No further overt bleeding appreciated. -Continue PPI. -Status post endoscopy on 06/21/2022 without evidence of acute bleeding source. -Colonoscopy has remained on hold until outpatient follow-up; per GI okay to resume diet and restart Anticoagulation while closely monitoring for overt bleeding.  Paroxysmal atrial flutter -In and out of A-fib/flutter -Continue telemetry monitoring -Continue the use of amiodarone and metoprolol -Rate appears to be stable and well-controlled currently -Continue holding anticoagulation in the setting of melanotic stools and positive fecal occult blood test.   -No overt bleeding appreciated; hemoglobin stable -Per GI service advance diet and resume anticoagulation; they will follow patient in 4 weeks after discharge.  Polymicrobial left ankle osteomyelitis- -s/p debridement/hardware removal  by Dr. Sharol Given on 10/25  -finished 6 weeks abx on  05/09/22 -finished linezolid 11/25-12/6 -Continue wound care service recommendation.  Chronic respiratory failure with hypoxia and hypercarbia/COPD -on 3-4L at baseline. -continue BDs as needed. -No wheezing or signs of acute exacerbation currently. -Stable breathing. -Currently using 7-8 L of oxygen supplementation; chest x-ray 8 not demonstrating significant changes from admission images. -After discussing with nephrology will continue IV Lasix (80 mg daily).   -Continue to wean oxygen supplementation as tolerated -If there is any increased somnolence or concerns for obtundation will check ABG as discussed/ordered yesterday (06/21/2022). -Patient down to 4-5 L nasal cannula supplementation today and expressing overall improvement in his breathing status.  Controlled DM2  -03/28/22 A1C--5.8 -hypoglycemic in am 1/16 -Continue sliding scale insulin. -Close monitoring of patient's CBGs especially while n.p.o. for procedure.  Gout -Not in flare -Continue colchicine   Chronic peripheral neuropathy/chronic back pain -Continue as needed judicious use of analgesics.   -Continue to hold gabapentin temporarily   Mood disorder -Overall stable-continue Prozac -Continue holding alprazolam temporarily, encouraged to minimize extra sedative and continue constant reorientation.   1.6 cm left thyroid nodule -Incidental finding on CT chest -Further work-up deferred to outpatient setting.   7 mm left lower lobe nodule -Incidental finding on CT chest. -Repeat CT chest 6-12 months.   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   Morbid Obesity: -Body mass index is 36.48 kg/m. -Low-calorie diet, portion control and increase physical activity discussed with patient.  Left leg wound -Continue to follow recommendation from wound care service -No signs of acute superimposed  infection appreciated.   Family Communication:  no Family at bedside  Consultants:  renal  Code Status:  FULL   DVT Prophylaxis: SCDs.  Patient chronically on Eliquis; on hold in the setting of GI bleed.   Procedures: As Listed in Progress Note Above  Antibiotics: Ceftriaxone 1/15>>06/23/22   Subjective: Good oxygen saturation on 4-5 L nasal cannula supplementation; no chest pain, no nausea, no vomiting, no overt bleeding.  Reports overall feeling better.  Complaining of left upper extremity pain.  Good urine output appreciated.   Objective: Vitals:   06/25/22 0445 06/25/22 0723 06/25/22 1500 06/25/22 1600  BP: (!) 146/81  (!) 141/80   Pulse: 73  96   Resp: 20  (!) 23   Temp: 98.4 F (36.9 C)  98 F (36.7 C)   TempSrc: Oral  Oral   SpO2: 94% 91% 93% 94%  Weight:      Height:        Intake/Output Summary (Last 24 hours) at 06/25/2022 1746 Last data filed at 06/25/2022 1200 Gross per 24 hour  Intake 3387.51 ml  Output 2150 ml  Net 1237.51 ml   Weight change:   Exam: General exam: Alert, awake, oriented x 3; no chest pain, no nausea, no vomiting.  Good saturation on 5 L supplementation and expressing feeling better.  Good urine output reported. Respiratory system: No presence of frank crackles, no wheezing, no using accessory muscles. Cardiovascular system: Regular controlled, no rubs, no gallops, unable to properly assess JVD with body habitus. Gastrointestinal system: Abdomen is obese, nondistended, soft and nontender. No organomegaly or masses felt. Normal bowel sounds heard. Central nervous system: Alert and oriented. No focal neurological deficits. Extremities/skin: No cyanosis or clubbing; complaining of left upper extremity pain from previous injury at a skilled nursing facility.  SSS dermatitis from chronic swelling appreciated; 1-2+ edema bilaterally seen.  Chronic left lower extremity wound without signs of superimposed infection and  clean dressings in  place. Psychiatry: Judgement and insight appear normal. Mood & affect appropriate.   Data Reviewed: I have personally reviewed following labs and imaging studies  Basic Metabolic Panel: Recent Labs  Lab 06/18/22 2055 06/19/22 0555 06/21/22 0346 06/22/22 0353 06/23/22 0439 06/24/22 0503 06/25/22 0514  NA  --    < > 145 146* 145 144 140  K  --    < > 3.8 3.4* 3.2* 3.0* 2.8*  CL  --    < > 111 113* 112* 112* 109  CO2  --    < > '25 23 23 24 24  '$ GLUCOSE  --    < > 101* 82 116* 129* 115*  BUN  --    < > 50* 45* 44* 39* 34*  CREATININE  --    < > 4.42* 4.10* 3.98* 3.72* 3.50*  CALCIUM  --    < > 8.4* 8.3* 8.2* 8.1* 7.6*  MG 2.3  --   --   --   --   --   --   PHOS  --    < > 4.2 3.7 3.6 3.1 2.7   < > = values in this interval not displayed.   Liver Function Tests: Recent Labs  Lab 06/18/22 1917 06/20/22 0428 06/21/22 0346 06/22/22 0353 06/23/22 0439 06/24/22 0503 06/25/22 0514  AST 9*  --   --   --   --   --   --   ALT 8  --   --   --   --   --   --   ALKPHOS 65  --   --   --   --   --   --   BILITOT 0.7  --   --   --   --   --   --   PROT 6.1*  --   --   --   --   --   --   ALBUMIN 2.4*   < > 2.4* 2.4* 2.4* 2.2* 2.1*   < > = values in this interval not displayed.   Recent Labs  Lab 06/18/22 1917  AMMONIA <10   CBC: Recent Labs  Lab 06/18/22 1917 06/19/22 0010 06/19/22 0555 06/19/22 1138 06/21/22 1113 06/22/22 0353 06/24/22 0503  WBC 7.8   < > 8.7 9.7 10.8* 11.6* 13.3*  NEUTROABS 6.6  --   --   --   --   --   --   HGB 7.8*   < > 8.1* 8.1* 8.5* 8.4* 8.2*  HCT 26.8*   < > 27.8* 28.0* 28.6* 28.8* 27.8*  MCV 100.0   < > 99.3 100.0 98.3 98.6 97.9  PLT 244   < > 274 276 240 240 197   < > = values in this interval not displayed.   CBG: Recent Labs  Lab 06/24/22 1647 06/25/22 0442 06/25/22 0741 06/25/22 1216 06/25/22 1724  GLUCAP 180* 110* 111* 110* 136*   Urine analysis:    Component Value Date/Time   COLORURINE YELLOW 06/18/2022 2027   APPEARANCEUR  CLOUDY (A) 06/18/2022 2027   LABSPEC 1.010 06/18/2022 2027   PHURINE 5.0 06/18/2022 2027   GLUCOSEU NEGATIVE 06/18/2022 2027   HGBUR LARGE (A) 06/18/2022 2027   BILIRUBINUR NEGATIVE 06/18/2022 2027   KETONESUR NEGATIVE 06/18/2022 2027   PROTEINUR 30 (A) 06/18/2022 2027   UROBILINOGEN 0.2 03/04/2015 1835   NITRITE NEGATIVE 06/18/2022 2027   LEUKOCYTESUR LARGE (A) 06/18/2022 2027   Sepsis Labs:  Recent  Results (from the past 240 hour(s))  Resp panel by RT-PCR (RSV, Flu A&B, Covid) Anterior Nasal Swab     Status: None   Collection Time: 06/18/22  7:13 PM   Specimen: Anterior Nasal Swab  Result Value Ref Range Status   SARS Coronavirus 2 by RT PCR NEGATIVE NEGATIVE Final    Comment: (NOTE) SARS-CoV-2 target nucleic acids are NOT DETECTED.  The SARS-CoV-2 RNA is generally detectable in upper respiratory specimens during the acute phase of infection. The lowest concentration of SARS-CoV-2 viral copies this assay can detect is 138 copies/mL. A negative result does not preclude SARS-Cov-2 infection and should not be used as the sole basis for treatment or other patient management decisions. A negative result may occur with  improper specimen collection/handling, submission of specimen other than nasopharyngeal swab, presence of viral mutation(s) within the areas targeted by this assay, and inadequate number of viral copies(<138 copies/mL). A negative result must be combined with clinical observations, patient history, and epidemiological information. The expected result is Negative.  Fact Sheet for Patients:  EntrepreneurPulse.com.au  Fact Sheet for Healthcare Providers:  IncredibleEmployment.be  This test is no t yet approved or cleared by the [place] FDA and  has been authorized for detection and/or diagnosis of SARS-CoV-2 by FDA under an Emergency Use Authorization (EUA). This EUA will remain  in effect (meaning this test can be used)  for the duration of the COVID-19 declaration under Section 564(b)(1) of the Act, 21 U.S.C.section 360bbb-3(b)(1), unless the authorization is terminated  or revoked sooner.       Influenza A by PCR NEGATIVE NEGATIVE Final   Influenza B by PCR NEGATIVE NEGATIVE Final    Comment: (NOTE) The Xpert Xpress SARS-CoV-2/FLU/RSV plus assay is intended as an aid in the diagnosis of influenza from Nasopharyngeal swab specimens and should not be used as a sole basis for treatment. Nasal washings and aspirates are unacceptable for Xpert Xpress SARS-CoV-2/FLU/RSV testing.  Fact Sheet for Patients: EntrepreneurPulse.com.au  Fact Sheet for Healthcare Providers: IncredibleEmployment.be  This test is not yet approved or cleared by the [place] FDA and has been authorized for detection and/or diagnosis of SARS-CoV-2 by FDA under an Emergency Use Authorization (EUA). This EUA will remain in effect (meaning this test can be used) for the duration of the COVID-19 declaration under Section 564(b)(1) of the Act, 21 U.S.C. section 360bbb-3(b)(1), unless the authorization is terminated or revoked.     Resp Syncytial Virus by PCR NEGATIVE NEGATIVE Final    Comment: (NOTE) Fact Sheet for Patients: EntrepreneurPulse.com.au  Fact Sheet for Healthcare Providers: IncredibleEmployment.be  This test is not yet approved or cleared by the [place] FDA and has been authorized for detection and/or diagnosis of SARS-CoV-2 by FDA under an Emergency Use Authorization (EUA). This EUA will remain in effect (meaning this test can be used) for the duration of the COVID-19 declaration under Section 564(b)(1) of the Act, 21 U.S.C. section 360bbb-3(b)(1), unless the authorization is terminated or revoked.  Performed at [facility], [address] [place], [place] [address]   Urine Culture     Status: Abnormal   Collection Time:  06/18/22  8:27 PM   Specimen: Urine, Clean Catch  Result Value Ref Range Status   Specimen Description   Final    URINE, CLEAN CATCH Performed at [facility], [address], [place], [place] [address]    Special Requests   Final    NONE Performed at [facility], [address] [place], [place]  [address]    Culture MULTIPLE SPECIES PRESENT, SUGGEST RECOLLECTION (A)  Final   Report Status 06/20/2022 FINAL  Final  MRSA Next Gen by PCR, Nasal     Status: Abnormal   Collection Time: 06/19/22  1:42 PM   Specimen: Nasal Mucosa; Nasal Swab  Result Value Ref Range Status   MRSA by PCR Next Gen DETECTED (A) NOT DETECTED Final    Comment: RESULT CALLED TO, READ BACK BY AND VERIFIED WITH: JENNIFER KING @ [phone] ON 06/20/22 C VARNER (NOTE) The GeneXpert MRSA Assay (FDA approved for NASAL specimens only), is one component of a comprehensive MRSA colonization surveillance program. It is not intended to diagnose MRSA infection nor to guide or monitor treatment for MRSA infections. Test performance is not FDA approved in patients less than 82 years old. Performed at [facility], [address], [place], [place] [address]      Scheduled Meds:  amiodarone  200 mg Oral Daily   apixaban  5 mg Oral BID   calcitRIOL  0.25 mcg Oral Daily   darbepoetin (ARANESP) injection - NON-DIALYSIS  300 mcg Subcutaneous Q Tue-1800   FLUoxetine  40 mg Oral Daily   fluticasone furoate-vilanterol  1 puff Inhalation Daily   furosemide  80 mg Intravenous Daily   insulin aspart  0-9 Units Subcutaneous Q4H   metoprolol tartrate  25 mg Oral BID   mupirocin ointment   Nasal BID   nystatin  5 mL Oral QID   nystatin   Topical TID   mouth rinse  15 mL Mouth Rinse 4 times per day   pantoprazole  40 mg Intravenous Q12H   saccharomyces boulardii  250 mg Oral BID   umeclidinium bromide  1 puff Inhalation Daily   Continuous Infusions:    Procedures/Studies: DG CHEST PORT 1 VIEW  Result Date: 06/21/2022 CLINICAL  DATA:  Shortness of breath.  [id number] EXAM: PORTABLE CHEST 1 VIEW COMPARISON:  06/18/2022 FINDINGS: Cardiac enlargement with mild pulmonary vascular congestion. Linear scarring in the upper lungs. Diffuse interstitial pattern to the lungs is likely edema. Appearance is not significantly changed since prior study. No pleural effusions. No pneumothorax. Mediastinal contours appear intact. Calcification of the aorta. IMPRESSION: Cardiac enlargement with pulmonary vascular congestion and mild interstitial edema similar to prior study. Electronically Signed   By: Lucienne Capers M.D.   On: 06/21/2022 18:26   DG Chest 1 View  Result Date: 06/18/2022 CLINICAL DATA:  Altered mental status. EXAM: CHEST  1 VIEW COMPARISON:  Chest radiograph 04/12/2022, CT chest 04/12/2022 FINDINGS: The heart is enlarged.  The upper mediastinal contours are normal. Previously seen opacity in the left upper lobe has essentially resolved, with mild residual patchy opacity which may reflect residual scarring. There is no new or worsening focal airspace disease. There are diffusely increased interstitial markings possibly reflecting mild pulmonary interstitial edema. There is no significant pleural effusion. There is no pneumothorax. Note that the smaller nodular opacity seen on the prior chest CT are not seen on the current study. There is no acute osseous abnormality. IMPRESSION: 1. Cardiomegaly with possible mild pulmonary interstitial edema. 2. Essentially resolved airspace opacity in the left upper lobe with probable residual scarring. 3. Additional smaller nodules seen on the prior chest CT are not seen on the current study. Please refer to that study for follow-up recommendations. Electronically Signed   By: Valetta Mole M.D.   On: 06/18/2022 20:09   DG Tibia/Fibula Left  Result Date: 06/18/2022 CLINICAL DATA:  Left lower extremity  erythema EXAM: LEFT TIBIA AND FIBULA - 2 VIEW COMPARISON:  03/22/2022 FINDINGS: Frontal and lateral  views of the left tibia and fibula are obtained. There is a residual screw traversing the medial malleolus. K-wire within the medial malleolus and the plate and screw fixation across the distal fibula have been removed in the interim. New heterotopic ossification along the dorsal margin of the distal fibula. There are no acute displaced fractures. The bones are diffusely osteopenic. No evidence of bony destruction or periosteal reaction to suggest osteomyelitis. There is diffuse subcutaneous edema. IMPRESSION: 1. Diffuse subcutaneous edema consistent with cellulitis. 2. No radiographic evidence of osteomyelitis. 3. Diffuse osteopenia. 4. Revision of orthopedic hardware about the left ankle as above. Electronically Signed   By: Randa Ngo M.D.   On: 06/18/2022 20:06   CT Head Wo Contrast  Result Date: 06/18/2022 CLINICAL DATA:  Altered mental status EXAM: CT HEAD WITHOUT CONTRAST TECHNIQUE: Contiguous axial images were obtained from the base of the skull through the vertex without intravenous contrast. RADIATION DOSE REDUCTION: This exam was performed according to the departmental dose-optimization program which includes automated exposure control, adjustment of the mA and/or kV according to patient size and/or use of iterative reconstruction technique. COMPARISON:  05/17/2019 FINDINGS: Brain: No mass, hemorrhage or extra-axial collection. There is periventricular hypoattenuation compatible with chronic microvascular disease. 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 Generalized volume loss. Vascular: Atherosclerotic calcification of the internal carotid and vertebral arteries at skull base. Skull: Normal. Negative for fracture or focal lesion. Sinuses/Orbits: No acute finding. Other: None. IMPRESSION: 1. No acute intracranial abnormality. 2. 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 Electronically Signed   By: Ulyses Jarred M.D.   On: 06/18/2022 19:50     Barton Dubois, MD  [facility]  If 7PM-7AM, please contact night-coverage [email or web address] Password [facility] 06/25/2022, 5:46 PM   LOS: 7 days

## 2022-06-25 NOTE — Progress Notes (Signed)
Dressing to LLE changed as ordered. Soiled dressing removed and leg cleansed with NS and pat dry. Xeroform applied to superficial scattered areas to LLE. Aquacel applied to left outer leg wound that is deeper and contains purulent, foul odorous drainage. ABD pads applied to anterior and posterior LLE and wrapped with kerlex for support. LLE elevated on pillow.

## 2022-06-25 NOTE — Procedures (Signed)
Was called to room by NT to take a look at a patient.  Staff stated patient had a desat episode.  Asked them to place him on NRB while I came out of contact gown and got to room.  Patient was sweating and sats were coming up but he started to complain of chest pain.  Asked staff to call a rapid and we placed patient on EKG machine and Dr. Earnest Conroy came into room and I gave her the copy of the EKG result.  Staff was collecting CBG results and vitals during this time.  Shelton Silvas, RRT came in and got blood gas and we started administering a PRN treatment to patient.  Patient was able to come off NRB and went back on [place] with adequate sats and patient stated CP was better.  Will continue to monitor patient.

## 2022-06-25 NOTE — Progress Notes (Signed)
Rapid response called for hypoxia and chest pressure. Trop 29, was previously 21, will cycle again. CXR shows vascular congestion, extra dose of lasix ordered. CMP reveals in improved, but still low potassium - 15mq ordered. EKG shows aflutter. Patient was also hypertensive, so metoprolol '5mg'$  IV ordered. Will continue to monitor.

## 2022-06-25 NOTE — Progress Notes (Signed)
At 2048 a Rapid Response was called. NT Sadie called this RN to bedside to assess patient. Pt tachypneic, diaphoretic, and pale. SPO2 dropped to 65% on HFNC 4L. Pt c/o centralized chest pain. This RN increased HFNC to 10 with minimal response. This RN immediately placed him on NRB in which he was able to maintain SPO2 at 100%. Chest pain resolved. Dr Josph MachoLamont Snowball to bedside and orders labs and CXR. 20 g IV placed in R AC by this RN. '5mg'$  of IV Metoprolol was given after EKG showed  Atrial Flutter with rate of  101 and patient hypertensive. Bryson Corona Edd Fabian

## 2022-06-26 ENCOUNTER — Encounter (HOSPITAL_COMMUNITY): Payer: Self-pay | Admitting: Internal Medicine

## 2022-06-26 ENCOUNTER — Ambulatory Visit: Payer: Medicare (Managed Care) | Admitting: Orthopedic Surgery

## 2022-06-26 DIAGNOSIS — D649 Anemia, unspecified: Secondary | ICD-10-CM | POA: Diagnosis not present

## 2022-06-26 DIAGNOSIS — G9341 Metabolic encephalopathy: Secondary | ICD-10-CM | POA: Diagnosis not present

## 2022-06-26 DIAGNOSIS — E1122 Type 2 diabetes mellitus with diabetic chronic kidney disease: Secondary | ICD-10-CM | POA: Diagnosis not present

## 2022-06-26 DIAGNOSIS — I4892 Unspecified atrial flutter: Secondary | ICD-10-CM | POA: Diagnosis not present

## 2022-06-26 LAB — GLUCOSE, CAPILLARY
Glucose-Capillary: 121 mg/dL — ABNORMAL HIGH (ref 70–99)
Glucose-Capillary: 134 mg/dL — ABNORMAL HIGH (ref 70–99)
Glucose-Capillary: 134 mg/dL — ABNORMAL HIGH (ref 70–99)
Glucose-Capillary: 144 mg/dL — ABNORMAL HIGH (ref 70–99)
Glucose-Capillary: 147 mg/dL — ABNORMAL HIGH (ref 70–99)
Glucose-Capillary: 153 mg/dL — ABNORMAL HIGH (ref 70–99)
Glucose-Capillary: 99 mg/dL (ref 70–99)

## 2022-06-26 LAB — RENAL FUNCTION PANEL
Albumin: 2.2 g/dL — ABNORMAL LOW (ref 3.5–5.0)
Anion gap: 6 (ref 5–15)
BUN: 32 mg/dL — ABNORMAL HIGH (ref 8–23)
CO2: 23 mmol/L (ref 22–32)
Calcium: 7.8 mg/dL — ABNORMAL LOW (ref 8.9–10.3)
Chloride: 111 mmol/L (ref 98–111)
Creatinine, Ser: 3.31 mg/dL — ABNORMAL HIGH (ref 0.61–1.24)
GFR, Estimated: 19 mL/min — ABNORMAL LOW (ref >60)
Glucose, Bld: 151 mg/dL — ABNORMAL HIGH (ref 70–99)
Phosphorus: 2.6 mg/dL (ref 2.5–4.6)
Potassium: 3.6 mmol/L (ref 3.5–5.1)
Sodium: 140 mmol/L (ref 135–145)

## 2022-06-26 LAB — CBC
HCT: 28.1 % — ABNORMAL LOW (ref 39.0–52.0)
Hemoglobin: 8.1 g/dL — ABNORMAL LOW (ref 13.0–17.0)
MCH: 28 pg (ref 26.0–34.0)
MCHC: 28.8 g/dL — ABNORMAL LOW (ref 30.0–36.0)
MCV: 97.2 fL (ref 80.0–100.0)
Platelets: 206 10*3/uL (ref 150–400)
RBC: 2.89 MIL/uL — ABNORMAL LOW (ref 4.22–5.81)
RDW: 16.4 % — ABNORMAL HIGH (ref 11.5–15.5)
WBC: 12.4 10*3/uL — ABNORMAL HIGH (ref 4.0–10.5)
nRBC: 0 % (ref 0.0–0.2)

## 2022-06-26 LAB — TROPONIN I (HIGH SENSITIVITY): Troponin I (High Sensitivity): 32 ng/L — ABNORMAL HIGH (ref <18)

## 2022-06-26 MED ORDER — HYDRALAZINE HCL 20 MG/ML IJ SOLN
10.0000 mg | Freq: Once | INTRAMUSCULAR | Status: AC
  Administered 2022-06-27: 10 mg via INTRAVENOUS
  Filled 2022-06-26: qty 1

## 2022-06-26 MED ORDER — CHLORHEXIDINE GLUCONATE CLOTH 2 % EX PADS
6.0000 | MEDICATED_PAD | Freq: Every day | CUTANEOUS | Status: DC
  Administered 2022-06-26 – 2022-06-30 (×5): 6 via TOPICAL

## 2022-06-26 NOTE — Evaluation (Signed)
Clinical/Bedside Swallow Evaluation Patient Details  Name: Eric Scott MRN: [id number] Date of Birth: 01-12-1951  Today's Date: 06/26/2022 Time: SLP Start Time (ACUTE ONLY): 2585 SLP Stop Time (ACUTE ONLY): 1327 SLP Time Calculation (min) (ACUTE ONLY): 22 min  Past Medical History:  Past Medical History:  Diagnosis Date   Anemia    Anxiety    Chronic pain    legs, back; MRI 05/2012 with mild thoracic degenerative changes no spinal stenosis    CKD (chronic kidney disease) 05/12/2019   Stage IV   COPD (chronic obstructive pulmonary disease) ([place])    COVID-19    Depression    Diabetic peripheral neuropathy ([place])    "chronic" (27/78/2423)   Diastolic CHF ([name])    Duodenal ulcer hemorrhage    DVT (deep venous thrombosis) ([name])    right upper arm   GERD (gastroesophageal reflux disease)    Hypercholesteremia    Hypertension    Iron deficiency anemia    Osteomyelitis of ankle ([name])    Peripheral edema    PVD (peripheral vascular disease) ([place])    Spinal stenosis    mild lumbar (MRI 05/2012)-L2-L3 to L4-L5 , mild lumbar foraminal stenosis    Stroke ([place]) 05/2012   Subacute, lacunar infarcts within the left basal ganglia and posterior limp of the left internal capsule/thalamus; "RUE; both feet weak" (05/27/2012)   Tremor    Type II diabetes mellitus ([place])    Past Surgical History:  Past Surgical History:  Procedure Laterality Date   ANKLE SURGERY     APPLICATION OF WOUND VAC Left 03/28/2022   Procedure: APPLICATION OF WOUND VAC;  Surgeon: [name], MD;  Location: [facility];  Service: Orthopedics;  Laterality: Left;   ESOPHAGOGASTRODUODENOSCOPY (EGD) WITH PROPOFOL N/A 12/21/2016   [place]: Ulcerative reflux esophagitis, erosive gastropathy, extensive duodenal ulceration likely site of bleeding.  Pathology with mild gastritis, no H. pylori   ESOPHAGOGASTRODUODENOSCOPY (EGD) WITH PROPOFOL N/A 04/02/2017   Fields: ESOPAHGITIS/GASTRIC AND DUODENAL ULCERS HEALED. 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  The patient was recently hospitalized from 04/12/2022 to 04/20/2022 secondary to acute on chronic respiratory failure due to COPD exacerbation, pneumonia and acute on chronic HFpEF.  During that hospitalization, the patient also had acute on chronic renal failure.. Underwent EGD 06/21/2022 no evidence of erosion, ulceration, or esophagitis in the esophagus.  There was evidence of gastritis and duodenitis. Pt with rapid response called over night and moved to ICU. BSE requested.    Assessment / Plan / Recommendation  Clinical Impression  Clinical swallow evaluation completed at bedside. Pt with reduced respiratory support and leaning to his right. Repositioning and oral care completed. Pt with generalized oral weakness (poor effort?). He was assessed with ice chips, thin water via  tsp/cup, nectar-thick liquids, puree, and soft textures. Pt with immediate cough after sip thin water (suspect due to poor oral control) over several trials. Pt with improved oral control with NTL and no coughing noted. He exhibited prolonged mastication and oral transit with solids and increased work of breathing noted. Recommend downgrade to D2 and  NTL with PO medications whole in puree, feeder assist, and SLP to follow in acute stay and determine need for instrumental assessment when he is ready. Above to RN.   SLP Visit Diagnosis: Dysphagia, unspecified (R13.10)    Aspiration Risk  Moderate aspiration risk;Risk for inadequate nutrition/hydration    Diet Recommendation Dysphagia 2 (Fine chop);Nectar-thick liquid   Liquid Administration via: Spoon;Cup;Straw Medication Administration: Whole meds with puree Supervision: Staff to assist with self feeding;Full supervision/cueing for compensatory strategies Compensations: Slow rate;Small sips/bites;Multiple dry swallows after each bite/sip Postural Changes: Seated upright at 90 degrees;Remain upright for at least 30 minutes after po intake    Other  Recommendations Oral Care Recommendations: Oral care BID;Staff/trained caregiver to provide oral care Other Recommendations: Order thickener from pharmacy;Clarify dietary restrictions    Recommendations for follow up therapy are one component of a multi-disciplinary discharge planning process, led by the attending physician.  Recommendations may be updated based on patient status, additional functional criteria and insurance authorization.  Follow up Recommendations Skilled nursing-short term rehab (<3 hours/day)      Assistance Recommended at Discharge    Functional Status Assessment Patient has had a recent decline in their functional status and demonstrates the ability to make significant improvements in function in a reasonable and predictable amount of time.  Frequency and Duration min 2x/week  1 week       Prognosis Prognosis for Safe Diet Advancement: Fair Barriers to Reach Goals: Severity of deficits      Swallow Study   General Date of Onset: 06/26/22 HPI: 72 year old male with a history of COPD and chronic respiratory failure on 3-4 L, HFpEF (EF 60-65%), CKD stage IV, diabetes mellitus type 2, atrial fibrillation, and left  ankle osteomyelitis status post hardware removal and debridement (polymicrobial infection with Acinetobacter/Morganella/Enterococcus faecalis/Staph aureus) presented from [facility] secondary to altered mental status.  The patient was recently hospitalized from 04/12/2022 to 04/20/2022 secondary to acute on chronic respiratory failure due to COPD exacerbation, pneumonia and acute on chronic HFpEF.  During that hospitalization, the patient also had acute on chronic renal failure.. Underwent EGD 06/21/2022 no evidence of erosion, ulceration, or esophagitis in the esophagus.  There was evidence of gastritis and duodenitis. Pt with rapid response called over night and moved to ICU. BSE requested. Type of Study: Bedside Swallow Evaluation Previous Swallow Assessment: BSE Nov 2023, reg/thin Diet Prior to this Study: Regular;Thin liquids Temperature Spikes Noted: No Respiratory Status: Nasal cannula History of Recent Intubation: No Behavior/Cognition: Alert;Cooperative;Requires cueing Oral Cavity Assessment: Dried secretions Oral Care Completed by SLP: Yes Oral Cavity - Dentition: Adequate natural dentition;Missing dentition Vision: Functional for self-feeding Self-Feeding Abilities: Needs assist Patient Positioning: Upright in bed (Pt leans to his right despite repositioning) Baseline Vocal Quality: Normal Volitional Cough: Strong;Congested Volitional Swallow: Able to elicit    Oral/Motor/Sensory Function Overall Oral Motor/Sensory Function: Generalized oral weakness   Ice Chips Ice chips: Within functional limits Presentation: Spoon   Thin Liquid Thin Liquid: Impaired Presentation: Cup;Spoon;Self Fed Oral Phase Impairments: Reduced labial seal;Reduced lingual movement/coordination Oral Phase Functional Implications: Right anterior spillage Pharyngeal  Phase Impairments: Suspected delayed Swallow;Decreased hyoid-laryngeal movement;Cough - Immediate    Nectar Thick Nectar  Thick Liquid:  Impaired Presentation: Cup;Spoon;Straw Oral Phase Impairments: Reduced lingual movement/coordination Pharyngeal Phase Impairments: Suspected delayed Swallow   Honey Thick Honey Thick Liquid: Not tested   Puree Puree: Within functional limits Presentation: Spoon   Solid     Solid: Impaired Presentation: Spoon Oral Phase Impairments: Impaired mastication;Reduced lingual movement/coordination Oral Phase Functional Implications: Prolonged oral transit     Thank you,  Genene Churn, [place]  Verlie Liotta 06/26/2022,1:43 PM

## 2022-06-26 NOTE — Progress Notes (Signed)
Patient ID: Eric Scott, male   DOB: Jun 10, 1950, 72 y.o.   MRN: [id number] S: Developed SOB and hypoxia last night requiring rapid response and transfer to ICU.  Feeling better today but on 6 L oxygen via [place]. O:BP (!) 174/75   Pulse 83   Temp 99.7 F (37.6 C) (Axillary)   Resp 20   Ht 6' (1.829 m)   Wt 127.5 kg   SpO2 97%   BMI 38.12 kg/m   Intake/Output Summary (Last 24 hours) at 06/26/2022 1012 Last data filed at 06/26/2022 0600 Gross per 24 hour  Intake 720 ml  Output 1253 ml  Net -533 ml   Intake/Output: I/O last 3 completed shifts: In: 2762.3 [P.O.:1980; I.V.:682.3; IV Piggyback:100] Out: 2303 [Urine:2302; Stool:1]  Intake/Output this shift:  No intake/output data recorded. Weight change:  LKJ:[id number] ill-appearing but in NAD CVS: RRR Resp: occ rhonchi bilaterally Abd: +BS, soft, NT/ND Ext: 1+ pretibial edema  Recent Labs  Lab 06/20/22 0428 06/21/22 0346 06/22/22 0353 06/23/22 0439 06/24/22 0503 06/25/22 0514 06/25/22 2102 06/26/22 0422  NA 143 145 146* 145 144 140 139 140  K 3.7 3.8 3.4* 3.2* 3.0* 2.8* 3.3* 3.6  CL 110 111 113* 112* 112* 109 106 111  CO2 '24 25 23 23 24 24 23 23  '$ GLUCOSE 92 101* 82 116* 129* 115* 153* 151*  BUN 56* 50* 45* 44* 39* 34* 32* 32*  CREATININE 4.57* 4.42* 4.10* 3.98* 3.72* 3.50* 3.52* 3.31*  ALBUMIN 2.4* 2.4* 2.4* 2.4* 2.2* 2.1* 2.3* 2.2*  CALCIUM 8.2* 8.4* 8.3* 8.2* 8.1* 7.6* 7.7* 7.8*  PHOS 4.4 4.2 3.7 3.6 3.1 2.7  --  2.6  AST  --   --   --   --   --   --  15  --   ALT  --   --   --   --   --   --  9  --    Liver Function Tests: Recent Labs  Lab 06/25/22 0514 06/25/22 2102 06/26/22 0422  AST  --  15  --   ALT  --  9  --   ALKPHOS  --  64  --   BILITOT  --  0.8  --   PROT  --  6.8  --   ALBUMIN 2.1* 2.3* 2.2*   No results for input(s): "LIPASE", "AMYLASE" in the last 168 hours. No results for input(s): "AMMONIA" in the last 168 hours. CBC: Recent Labs  Lab 06/19/22 1138 06/21/22 1113 06/22/22 0353  06/24/22 0503 06/26/22 0422  WBC 9.7 10.8* 11.6* 13.3* 12.4*  HGB 8.1* 8.5* 8.4* 8.2* 8.1*  HCT 28.0* 28.6* 28.8* 27.8* 28.1*  MCV 100.0 98.3 98.6 97.9 97.2  PLT 276 240 240 197 206   Cardiac Enzymes: No results for input(s): "CKTOTAL", "CKMB", "CKMBINDEX", "TROPONINI" in the last 168 hours. CBG: Recent Labs  Lab 06/25/22 2049 06/26/22 0018 06/26/22 0423 06/26/22 0518 06/26/22 0813  GLUCAP 137* 153* 147* 144* 134*    Iron Studies: No results for input(s): "IRON", "TIBC", "TRANSFERRIN", "FERRITIN" in the last 72 hours. Studies/Results: DG Chest Port 1 View  Result Date: 06/25/2022 CLINICAL DATA:  Hypoxia EXAM: PORTABLE CHEST 1 VIEW COMPARISON:  Chest x-ray 06/21/2022 FINDINGS: The heart is enlarged. Again seen is central pulmonary vascular congestion. There is a new small right pleural effusion. There are minimal patchy opacities at the lung bases. There is no evidence for pneumothorax or acute fracture. IMPRESSION: 1. Cardiomegaly with central pulmonary vascular congestion. 2. New  small right pleural effusion. Electronically Signed   By: Ronney Asters M.D.   On: 06/25/2022 21:24    amiodarone  200 mg Oral Daily   apixaban  5 mg Oral BID   calcitRIOL  0.25 mcg Oral Daily   Chlorhexidine Gluconate Cloth  6 each Topical Daily   darbepoetin (ARANESP) injection - NON-DIALYSIS  300 mcg Subcutaneous Q Tue-1800   FLUoxetine  40 mg Oral Daily   fluticasone furoate-vilanterol  1 puff Inhalation Daily   furosemide  80 mg Intravenous Daily   insulin aspart  0-9 Units Subcutaneous Q4H   metoprolol tartrate  25 mg Oral BID   mupirocin ointment   Nasal BID   nystatin  5 mL Oral QID   nystatin   Topical TID   mouth rinse  15 mL Mouth Rinse 4 times per day   pantoprazole  40 mg Intravenous Q12H   saccharomyces boulardii  250 mg Oral BID   umeclidinium bromide  1 puff Inhalation Daily    BMET    Component Value Date/Time   NA 140 06/26/2022 0422   K 3.6 06/26/2022 0422   CL 111  06/26/2022 0422   CO2 23 06/26/2022 0422   GLUCOSE 151 (H) 06/26/2022 0422   BUN 32 (H) 06/26/2022 0422   CREATININE 3.31 (H) 06/26/2022 0422   CALCIUM 7.8 (L) 06/26/2022 0422   CALCIUM 9.2 01/31/2021 1311   GFRNONAA 19 (L) 06/26/2022 0422   GFRAA 16 (L) 12/21/2019 0633   CBC    Component Value Date/Time   WBC 12.4 (H) 06/26/2022 0422   RBC 2.89 (L) 06/26/2022 0422   HGB 8.1 (L) 06/26/2022 0422   HCT 28.1 (L) 06/26/2022 0422   PLT 206 06/26/2022 0422   MCV 97.2 06/26/2022 0422   MCH 28.0 06/26/2022 0422   MCHC 28.8 (L) 06/26/2022 0422   RDW 16.4 (H) 06/26/2022 0422   LYMPHSABS 0.4 (L) 06/18/2022 1917   MONOABS 0.4 06/18/2022 1917   EOSABS 0.3 06/18/2022 1917   BASOSABS 0.0 06/18/2022 1917    Assessment/Plan:   Acute on chronic hypoxic respiratory failure- now on 6L oxygen via [place] and given extra dose of IV lasix with some improvement.  Continue to diurese. AKI/CKD stage IV - Scr now back at baseline.  Given worsening oxygen requirements, IVF's stopped and give IV lasix 80 mg with marked increase in UOP.  SCr improving with diuresis.  Continue with IV lasix 80 mg daily and may need to go to bid.  No indication for dialysis at this time.  Will continue to follow.  Pt is a poor dialysis candidate given multiple co-morbidities and poor functional status.  Consider Palliative care consult for goals/limits of care.   Acute on chronic metabolic encephalopathy - multifactorial with AKI, UTI, and polypharmacy.  Improving mentation. UTI - treated with ceftriaxone per primary ABLA on anemia of CKD stage IV - FOBT+, apixaban on hold.  Gi consulted due to melena.  EGD negative for acute bleed and colonoscopy on hold due ot respiratory status. Atrial flutter - on amiodarone and metoprolol.  Off of Eliquis for now due to melena and ABLA COPD - continue with nebulizers. HTN/volume  - stable and diuresing well. DM - per primary  Donetta Potts, MD [facility]

## 2022-06-26 NOTE — Progress Notes (Addendum)
Pt now resting in bed with HOB elevated to 90 degrees. Denies SOB. Observed use of accessory muscles. O2 sustaining at 93-94% on 7L HFNC. Afib HR fluctuating between 77 and 102. Diaphoretic and Afebrile at this time. Reports L forearm and hand discomfort with movement. Edema noted to LFA. Elevated at this time. Educated on the importance of maintaining supplemental oxygen and ways to prevent from self displacement of nasal cannula. Pt verbalized understanding. Ordered to transfer to ICU. Report called in to ICU.

## 2022-06-26 NOTE — Progress Notes (Signed)
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  The patient was recently hospitalized from 04/12/2022 to 04/20/2022 secondary to acute on chronic respiratory failure due to COPD exacerbation, pneumonia and acute on chronic HFpEF.  During that hospitalization, the patient also had acute on chronic renal failure.  He was felt to be hypervolemic and started on torsemide.   Per triage notes and ED provider who talked to staff at facility, at about 5 PM staff noticed that patient was confused.  Reported his blood sugars ranging in the 400s subsequently dropped to 68>> 130.  It is unknown if insulin was given when his blood sugars were in the 400s, a different staff had taken over patient's care.  Glucagon was given.  He has also had chronic left lower extremity pain which is unchanged. He also got his Lantus on the day of admission. In the ED, the patient was afebrile hemodynamically stable with oxygen saturation 92-95% on 4 L.  WBC 8.7, hemoglobin 8.1, platelets 274,000.  Sodium 143, potassium 3.9, bicarbonate 23, serum creatinine 5.02.   0.4 mg IV Narcan given, with some initial improvement in mental status, subsequent 0.8 mg Narcan given without significant response.  Patient was admitted for altered mental status and acute on chronic renal failure.   Assessment/Plan: Acute on chronic metabolic encephalopathy -Multifactorial including hypoglycemia, UTI, AKI, polypharmacy in the setting of impaired renal function -Mentation overall improved and  back to baseline now.   -CT brain negative -Continue gentle fluid resuscitation along with IV Lasix as recommended by nephrology service. -Continue holding gabapentin for now. -Continue constant reorientation. -Physical therapy has recommended skilled nursing facility at discharge.  UTI -UA>50 WBC -Continue ceftriaxone therapy -Cultures not helpful given multiple species isolated -Treated empirically with antibiotic therapy; has completed treatment on 06/23/2022.  (A total of 5 days IV antibiotics provided). -Patient denies dysuria at this point..  Acute on chronic renal failure--CKD stage IV -Baseline creatinine 3.3-3.7 -Presented with serum creatinine 5.02 -Nephrology consult appreciated -IV fluids discontinued per nephrology; continue IV diuresis. -Continue close monitoring of renal function trend/stability. -Continue to minimize/avoid the use of nephrotoxic agents and follow creatinine. -No requiring hemodialysis at this point. -Creatinine 3.31 today (essentially back to baseline)..  Melena/FOBT positive -GI consultation appreciated. -holding apixaban temporarily -Continue NPO status. -had melena in ED at time of admission.  No further overt bleeding appreciated. -Continue PPI. -Status post endoscopy on 06/21/2022 without evidence of acute bleeding source. -Colonoscopy has remained on hold until outpatient follow-up; per GI okay to resume diet and restart Anticoagulation while closely monitoring for overt bleeding. -Continue to follow hemoglobin trend. -Hemoglobin currently 8.1.  Paroxysmal atrial flutter -In and out of A-fib/flutter -Continue telemetry monitoring -Continue the use of amiodarone and metoprolol -Rate appears to be stable and well-controlled currently -Continue holding anticoagulation in the setting of melanotic stools and positive fecal occult blood test.   -No overt bleeding appreciated; hemoglobin stable -Per GI service advance diet and resume  anticoagulation; they will follow patient in 4 weeks after discharge.  Polymicrobial left ankle osteomyelitis- -s/p debridement/hardware removal by Dr. Sharol Given on  10/25  -finished 6 weeks abx on 05/09/22 -finished linezolid 11/25-12/6 -Continue wound care service recommendation.  Chronic respiratory failure with hypoxia and hypercarbia/COPD -on 3-4L at baseline. -continue BDs as needed. -No wheezing or signs of acute exacerbation currently. -Stable breathing. -Currently using 7-8 L of oxygen supplementation; chest x-ray 8 not demonstrating significant changes from admission images. -After discussing with nephrology will continue IV Lasix (80 mg daily).   -Continue to wean oxygen supplementation as tolerated -If there is any increased somnolence or concerns for obtundation will recommend checking ABG.  Patient at high risk for hypercapnia. -Continue to wean off oxygen supplementation as tolerated; currently down to 6L high flow nasal cannula. -Continue IV diuresis as per nephrology service recommendation.  Controlled DM2  -03/28/22 A1C--5.8 -hypoglycemic in am 1/16 -Continue sliding scale insulin. -Close monitoring of patient's CBGs especially while n.p.o. for procedure.  Gout -Not in flare -Continue colchicine   Chronic peripheral neuropathy/chronic back pain -Continue as needed judicious use of analgesics.   -Continue to hold gabapentin temporarily   Mood disorder -Overall stable-continue Prozac -Continue holding alprazolam temporarily, encouraged to minimize extra sedative and continue constant reorientation.   1.6 cm left thyroid nodule -Incidental finding on CT chest -Further work-up deferred to outpatient setting.   7 mm left lower lobe nodule -Incidental finding on CT chest. -Repeat CT chest 6-12 months.   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   Morbid Obesity: -Body mass index is 38.12  kg/m. -Low-calorie diet, portion control and increase physical activity discussed with patient.  Left leg wound -Continue to follow recommendation from wound care service -No signs of acute superimposed infection appreciated.   Family Communication:  no Family at bedside  Consultants:  renal  Code Status:  FULL   DVT Prophylaxis: SCDs.  Patient chronically on Eliquis; on hold in the setting of GI bleed.   Procedures: As Listed in Progress Note Above  Antibiotics: Ceftriaxone 1/15>>06/23/22   Subjective: Overnight with increased respiratory distress requiring transiently the use of nonrebreather mask and later around 7-8 L high flow nasal cannula supplementation.  Patient also expressed complaints of chest pressure.  Currently breathing easier and denying chest pain.  He had good response to extra Lasix given  Objective: Vitals:   06/26/22 1500 06/26/22 1600 06/26/22 1613 06/26/22 1700  BP:  (!) 175/130  (!) 194/84  Pulse: 80 87  82  Resp: (!) 23 (!) 22  (!) 24  Temp:   99.5 F (37.5 C)   TempSrc:   Axillary   SpO2: 94% 96%  95%  Weight:      Height:        Intake/Output Summary (Last 24 hours) at 06/26/2022 1812 Last data filed at 06/26/2022 1330 Gross per 24 hour  Intake 240 ml  Output 903 ml  Net -663 ml   Weight change:   Exam: General exam: Alert, awake, oriented x 3; no chest pain, no nausea, no vomiting.  Reports breathing better today.  Overnight in the requiring increase in his oxygen supplementation and expressed chest pressure. Respiratory system: Positive rhonchi, no wheezing, decreased breath sounds at the bases.  No using accessory muscles. Cardiovascular system:Rate controlled. No rubs or gallops Gastrointestinal system: Abdomen is obese, nondistended, soft and nontender. No organomegaly or masses felt. Normal bowel sounds heard. Central nervous system: Alert and oriented. No focal neurological deficits. Extremities/skin: No cyanosis or clubbing;  patient with left upper extremity/forearm pain with any movement from previous  injury at his skilled nursing facility (no fractures on images performed at that time); lower extremity with chronic stasis dermatitis, left chronic wound affecting his ankle without signs of superimposed infection.  2+ edema appreciated bilaterally. Psychiatry: Judgement and insight appear normal. Mood & affect appropriate.   Data Reviewed: I have personally reviewed following labs and imaging studies  Basic Metabolic Panel: Recent Labs  Lab 06/22/22 0353 06/23/22 0439 06/24/22 0503 06/25/22 0514 06/25/22 2102 06/26/22 0422  NA 146* 145 144 140 139 140  K 3.4* 3.2* 3.0* 2.8* 3.3* 3.6  CL 113* 112* 112* 109 106 111  CO2 '23 23 24 24 23 23  '$ GLUCOSE 82 116* 129* 115* 153* 151*  BUN 45* 44* 39* 34* 32* 32*  CREATININE 4.10* 3.98* 3.72* 3.50* 3.52* 3.31*  CALCIUM 8.3* 8.2* 8.1* 7.6* 7.7* 7.8*  PHOS 3.7 3.6 3.1 2.7  --  2.6   Liver Function Tests: Recent Labs  Lab 06/23/22 0439 06/24/22 0503 06/25/22 0514 06/25/22 2102 06/26/22 0422  AST  --   --   --  15  --   ALT  --   --   --  9  --   ALKPHOS  --   --   --  64  --   BILITOT  --   --   --  0.8  --   PROT  --   --   --  6.8  --   ALBUMIN 2.4* 2.2* 2.1* 2.3* 2.2*   No results for input(s): "AMMONIA" in the last 168 hours.  CBC: Recent Labs  Lab 06/21/22 1113 06/22/22 0353 06/24/22 0503 06/26/22 0422  WBC 10.8* 11.6* 13.3* 12.4*  HGB 8.5* 8.4* 8.2* 8.1*  HCT 28.6* 28.8* 27.8* 28.1*  MCV 98.3 98.6 97.9 97.2  PLT 240 240 197 206   CBG: Recent Labs  Lab 06/26/22 0423 06/26/22 0518 06/26/22 0813 06/26/22 1134 06/26/22 1613  GLUCAP 147* 144* 134* 134* 99   Urine analysis:    Component Value Date/Time   COLORURINE YELLOW 06/18/2022 2027   APPEARANCEUR CLOUDY (A) 06/18/2022 2027   LABSPEC 1.010 06/18/2022 2027   PHURINE 5.0 06/18/2022 2027   GLUCOSEU NEGATIVE 06/18/2022 2027   HGBUR LARGE (A) 06/18/2022 2027   BILIRUBINUR  NEGATIVE 06/18/2022 2027   KETONESUR NEGATIVE 06/18/2022 2027   PROTEINUR 30 (A) 06/18/2022 2027   UROBILINOGEN 0.2 03/04/2015 1835   NITRITE NEGATIVE 06/18/2022 2027   LEUKOCYTESUR LARGE (A) 06/18/2022 2027   Sepsis Labs:  Recent Results (from the past 240 hour(s))  Resp panel by RT-PCR (RSV, Flu A&B, Covid) Anterior Nasal Swab     Status: None   Collection Time: 06/18/22  7:13 PM   Specimen: Anterior Nasal Swab  Result Value Ref Range Status   SARS Coronavirus 2 by RT PCR NEGATIVE NEGATIVE Final    Comment: (NOTE) SARS-CoV-2 target nucleic acids are NOT DETECTED.  The SARS-CoV-2 RNA is generally detectable in upper respiratory specimens during the acute phase of infection. The lowest concentration of SARS-CoV-2 viral copies this assay can detect is 138 copies/mL. A negative result does not preclude SARS-Cov-2 infection and should not be used as the sole basis for treatment or other patient management decisions. A negative result may occur with  improper specimen collection/handling, submission of specimen other than nasopharyngeal swab, presence of viral mutation(s) within the areas targeted by this assay, and inadequate number of viral copies(<138 copies/mL). A negative result must be combined with clinical observations, patient history, and epidemiological information. The expected  result is Negative.  Fact Sheet for Patients:  EntrepreneurPulse.com.au  Fact Sheet for Healthcare Providers:  IncredibleEmployment.be  This test is no t yet approved or cleared by the [place] FDA and  has been authorized for detection and/or diagnosis of SARS-CoV-2 by FDA under an Emergency Use Authorization (EUA). This EUA will remain  in effect (meaning this test can be used) for the duration of the COVID-19 declaration under Section 564(b)(1) of the Act, 21 U.S.C.section 360bbb-3(b)(1), unless the authorization is terminated  or revoked sooner.        Influenza A by PCR NEGATIVE NEGATIVE Final   Influenza B by PCR NEGATIVE NEGATIVE Final    Comment: (NOTE) The Xpert Xpress SARS-CoV-2/FLU/RSV plus assay is intended as an aid in the diagnosis of influenza from Nasopharyngeal swab specimens and should not be used as a sole basis for treatment. Nasal washings and aspirates are unacceptable for Xpert Xpress SARS-CoV-2/FLU/RSV testing.  Fact Sheet for Patients: EntrepreneurPulse.com.au  Fact Sheet for Healthcare Providers: IncredibleEmployment.be  This test is not yet approved or cleared by the [place] FDA and has been authorized for detection and/or diagnosis of SARS-CoV-2 by FDA under an Emergency Use Authorization (EUA). This EUA will remain in effect (meaning this test can be used) for the duration of the COVID-19 declaration under Section 564(b)(1) of the Act, 21 U.S.C. section 360bbb-3(b)(1), unless the authorization is terminated or revoked.     Resp Syncytial Virus by PCR NEGATIVE NEGATIVE Final    Comment: (NOTE) Fact Sheet for Patients: EntrepreneurPulse.com.au  Fact Sheet for Healthcare Providers: IncredibleEmployment.be  This test is not yet approved or cleared by the [place] FDA and has been authorized for detection and/or diagnosis of SARS-CoV-2 by FDA under an Emergency Use Authorization (EUA). This EUA will remain in effect (meaning this test can be used) for the duration of the COVID-19 declaration under Section 564(b)(1) of the Act, 21 U.S.C. section 360bbb-3(b)(1), unless the authorization is terminated or revoked.  Performed at [facility], [address], [place], [place] [address]   Urine Culture     Status: Abnormal   Collection Time: 06/18/22  8:27 PM   Specimen: Urine, Clean Catch  Result Value Ref Range Status   Specimen Description   Final    URINE, CLEAN CATCH Performed at [facility], [address] [place], [place] [address]    Special Requests   Final    NONE Performed at [facility], [address] [place], [place] [address]    Culture MULTIPLE SPECIES PRESENT, SUGGEST RECOLLECTION (A)  Final   Report Status 06/20/2022 FINAL  Final  MRSA Next Gen by PCR, Nasal     Status: Abnormal   Collection Time: 06/19/22  1:42 PM   Specimen: Nasal Mucosa; Nasal Swab  Result Value Ref Range Status   MRSA by PCR Next Gen DETECTED (A) NOT DETECTED Final    Comment: RESULT CALLED TO, READ BACK BY AND VERIFIED WITH: JENNIFER KING @ [phone] ON 06/20/22 C VARNER (NOTE) The GeneXpert MRSA Assay (FDA approved for NASAL specimens only), is one component of a comprehensive MRSA colonization surveillance program. It is not intended to diagnose MRSA infection nor to guide or monitor treatment for MRSA infections. Test performance is not FDA approved in patients less than 75 years old. Performed at [facility], [address], [place], [place] [address]      Scheduled Meds:  amiodarone  200 mg Oral Daily   apixaban  5 mg Oral BID  calcitRIOL  0.25 mcg Oral Daily   Chlorhexidine Gluconate Cloth  6 each Topical Daily   darbepoetin (ARANESP) injection - NON-DIALYSIS  300 mcg Subcutaneous Q Tue-1800   FLUoxetine  40 mg Oral Daily   fluticasone furoate-vilanterol  1 puff Inhalation Daily   furosemide  80 mg Intravenous Daily   insulin aspart  0-9 Units Subcutaneous Q4H   metoprolol tartrate  25 mg Oral BID   mupirocin ointment   Nasal BID   nystatin  5 mL Oral QID   nystatin   Topical TID   mouth rinse  15 mL Mouth Rinse 4 times per day   pantoprazole  40 mg Intravenous Q12H   saccharomyces boulardii  250 mg Oral BID   umeclidinium bromide  1 puff Inhalation Daily   Continuous Infusions:    Procedures/Studies: DG Chest Port 1 View  Result Date: 06/25/2022 CLINICAL DATA:  Hypoxia EXAM: PORTABLE CHEST 1 VIEW COMPARISON:  Chest x-ray 06/21/2022 FINDINGS: The heart is enlarged. Again seen is  central pulmonary vascular congestion. There is a new small right pleural effusion. There are minimal patchy opacities at the lung bases. There is no evidence for pneumothorax or acute fracture. IMPRESSION: 1. Cardiomegaly with central pulmonary vascular congestion. 2. New small right pleural effusion. Electronically Signed   By: Ronney Asters M.D.   On: 06/25/2022 21:24   DG CHEST PORT 1 VIEW  Result Date: 06/21/2022 CLINICAL DATA:  Shortness of breath.  [id number] EXAM: PORTABLE CHEST 1 VIEW COMPARISON:  06/18/2022 FINDINGS: Cardiac enlargement with mild pulmonary vascular congestion. Linear scarring in the upper lungs. Diffuse interstitial pattern to the lungs is likely edema. Appearance is not significantly changed since prior study. No pleural effusions. No pneumothorax. Mediastinal contours appear intact. Calcification of the aorta. IMPRESSION: Cardiac enlargement with pulmonary vascular congestion and mild interstitial edema similar to prior study. Electronically Signed   By: Lucienne Capers M.D.   On: 06/21/2022 18:26   DG Chest 1 View  Result Date: 06/18/2022 CLINICAL DATA:  Altered mental status. EXAM: CHEST  1 VIEW COMPARISON:  Chest radiograph 04/12/2022, CT chest 04/12/2022 FINDINGS: The heart is enlarged.  The upper mediastinal contours are normal. Previously seen opacity in the left upper lobe has essentially resolved, with mild residual patchy opacity which may reflect residual scarring. There is no new or worsening focal airspace disease. There are diffusely increased interstitial markings possibly reflecting mild pulmonary interstitial edema. There is no significant pleural effusion. There is no pneumothorax. Note that the smaller nodular opacity seen on the prior chest CT are not seen on the current study. There is no acute osseous abnormality. IMPRESSION: 1. Cardiomegaly with possible mild pulmonary interstitial edema. 2. Essentially resolved airspace opacity in the left upper lobe with  probable residual scarring. 3. Additional smaller nodules seen on the prior chest CT are not seen on the current study. Please refer to that study for follow-up recommendations. Electronically Signed   By: Valetta Mole M.D.   On: 06/18/2022 20:09   DG Tibia/Fibula Left  Result Date: 06/18/2022 CLINICAL DATA:  Left lower extremity erythema EXAM: LEFT TIBIA AND FIBULA - 2 VIEW COMPARISON:  03/22/2022 FINDINGS: Frontal and lateral views of the left tibia and fibula are obtained. There is a residual screw traversing the medial malleolus. K-wire within the medial malleolus and the plate and screw fixation across the distal fibula have been removed in the interim. New heterotopic ossification along the dorsal margin of the distal fibula. There are no acute displaced fractures.  The bones are diffusely osteopenic. No evidence of bony destruction or periosteal reaction to suggest osteomyelitis. There is diffuse subcutaneous edema. IMPRESSION: 1. Diffuse subcutaneous edema consistent with cellulitis. 2. No radiographic evidence of osteomyelitis. 3. Diffuse osteopenia. 4. Revision of orthopedic hardware about the left ankle as above. Electronically Signed   By: Randa Ngo M.D.   On: 06/18/2022 20:06   CT Head Wo Contrast  Result Date: 06/18/2022 CLINICAL DATA:  Altered mental status EXAM: CT HEAD WITHOUT CONTRAST TECHNIQUE: Contiguous axial images were obtained from the base of the skull through the vertex without intravenous contrast. RADIATION DOSE REDUCTION: This exam was performed according to the departmental dose-optimization program which includes automated exposure control, adjustment of the mA and/or kV according to patient size and/or use of iterative reconstruction technique. COMPARISON:  05/17/2019 FINDINGS: Brain: No mass, hemorrhage or extra-axial collection. There is periventricular hypoattenuation compatible with chronic microvascular disease. 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  Generalized volume loss. Vascular: Atherosclerotic calcification of the internal carotid and vertebral arteries at skull base. Skull: Normal. Negative for fracture or focal lesion. Sinuses/Orbits: No acute finding. Other: None. IMPRESSION: 1. No acute intracranial abnormality. 2. 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 Electronically Signed   By: Ulyses Jarred M.D.   On: 06/18/2022 19:50    Barton Dubois, MD  [facility]  If 7PM-7AM, please contact night-coverage [email or web address] Password [email or web address] 06/26/2022, 6:12 PM   LOS: 8 days

## 2022-06-26 NOTE — Progress Notes (Signed)
Pt had pulled O2 off upon Cortney,(Primary RN) assessment. Spo2 74%. Pt immediately placed back on HFNC at 7 L. Pt currently at 91-93% on 7 L HFNC. Pt is tachypneic and using accessory muscles.  Respiratory was consulted and advised NRB. Dr. Josph Macho- Lamont Snowball was informed and this RN inquired about patient being upgraded to ICU. Awaiting further orders. Bryson Corona Edd Fabian

## 2022-06-27 ENCOUNTER — Encounter (HOSPITAL_COMMUNITY): Payer: Self-pay | Admitting: Internal Medicine

## 2022-06-27 DIAGNOSIS — Z7189 Other specified counseling: Secondary | ICD-10-CM

## 2022-06-27 DIAGNOSIS — N184 Chronic kidney disease, stage 4 (severe): Secondary | ICD-10-CM | POA: Diagnosis not present

## 2022-06-27 DIAGNOSIS — G9341 Metabolic encephalopathy: Secondary | ICD-10-CM | POA: Diagnosis not present

## 2022-06-27 DIAGNOSIS — Z515 Encounter for palliative care: Secondary | ICD-10-CM | POA: Diagnosis not present

## 2022-06-27 LAB — CBC
HCT: 29 % — ABNORMAL LOW (ref 39.0–52.0)
Hemoglobin: 8.7 g/dL — ABNORMAL LOW (ref 13.0–17.0)
MCH: 29 pg (ref 26.0–34.0)
MCHC: 30 g/dL (ref 30.0–36.0)
MCV: 96.7 fL (ref 80.0–100.0)
Platelets: 226 10*3/uL (ref 150–400)
RBC: 3 MIL/uL — ABNORMAL LOW (ref 4.22–5.81)
RDW: 16.7 % — ABNORMAL HIGH (ref 11.5–15.5)
WBC: 11.6 10*3/uL — ABNORMAL HIGH (ref 4.0–10.5)
nRBC: 0 % (ref 0.0–0.2)

## 2022-06-27 LAB — RENAL FUNCTION PANEL
Albumin: 2.2 g/dL — ABNORMAL LOW (ref 3.5–5.0)
Anion gap: 8 (ref 5–15)
BUN: 33 mg/dL — ABNORMAL HIGH (ref 8–23)
CO2: 24 mmol/L (ref 22–32)
Calcium: 8 mg/dL — ABNORMAL LOW (ref 8.9–10.3)
Chloride: 110 mmol/L (ref 98–111)
Creatinine, Ser: 3.35 mg/dL — ABNORMAL HIGH (ref 0.61–1.24)
GFR, Estimated: 19 mL/min — ABNORMAL LOW (ref >60)
Glucose, Bld: 107 mg/dL — ABNORMAL HIGH (ref 70–99)
Phosphorus: 2.5 mg/dL (ref 2.5–4.6)
Potassium: 3.3 mmol/L — ABNORMAL LOW (ref 3.5–5.1)
Sodium: 142 mmol/L (ref 135–145)

## 2022-06-27 LAB — GLUCOSE, CAPILLARY
Glucose-Capillary: 105 mg/dL — ABNORMAL HIGH (ref 70–99)
Glucose-Capillary: 115 mg/dL — ABNORMAL HIGH (ref 70–99)
Glucose-Capillary: 119 mg/dL — ABNORMAL HIGH (ref 70–99)
Glucose-Capillary: 121 mg/dL — ABNORMAL HIGH (ref 70–99)
Glucose-Capillary: 125 mg/dL — ABNORMAL HIGH (ref 70–99)
Glucose-Capillary: 145 mg/dL — ABNORMAL HIGH (ref 70–99)
Glucose-Capillary: 97 mg/dL (ref 70–99)

## 2022-06-27 MED ORDER — HEPARIN SODIUM (PORCINE) 5000 UNIT/ML IJ SOLN
5000.0000 [IU] | Freq: Two times a day (BID) | INTRAMUSCULAR | Status: DC
  Administered 2022-06-27 – 2022-06-28 (×2): 5000 [IU] via SUBCUTANEOUS
  Filled 2022-06-27 (×2): qty 1

## 2022-06-27 MED ORDER — FUROSEMIDE 10 MG/ML IJ SOLN
80.0000 mg | Freq: Two times a day (BID) | INTRAMUSCULAR | Status: DC
  Administered 2022-06-27: 80 mg via INTRAVENOUS
  Filled 2022-06-27: qty 8

## 2022-06-27 MED ORDER — METOPROLOL TARTRATE 25 MG PO TABS: 12.5000 mg | ORAL_TABLET | Freq: Two times a day (BID) | ORAL | Status: DC

## 2022-06-27 MED ORDER — LABETALOL HCL 5 MG/ML IV SOLN: 10.0000 mg | INTRAVENOUS | Status: DC | PRN

## 2022-06-27 MED ORDER — TAMSULOSIN HCL 0.4 MG PO CAPS
0.4000 mg | ORAL_CAPSULE | Freq: Every day | ORAL | Status: DC
  Administered 2022-06-27 – 2022-07-01 (×5): 0.4 mg via ORAL
  Filled 2022-06-27 (×5): qty 1

## 2022-06-27 MED ORDER — FUROSEMIDE 10 MG/ML IJ SOLN
80.0000 mg | Freq: Every day | INTRAMUSCULAR | Status: DC
  Administered 2022-06-28: 80 mg via INTRAVENOUS
  Filled 2022-06-27: qty 8

## 2022-06-27 MED ORDER — POTASSIUM CHLORIDE CRYS ER 20 MEQ PO TBCR
40.0000 meq | EXTENDED_RELEASE_TABLET | ORAL | Status: DC
  Administered 2022-06-27: 40 meq via ORAL
  Filled 2022-06-27: qty 2

## 2022-06-27 MED ORDER — SENNOSIDES-DOCUSATE SODIUM 8.6-50 MG PO TABS
2.0000 | ORAL_TABLET | Freq: Every day | ORAL | Status: DC
  Administered 2022-06-27 – 2022-07-01 (×5): 2 via ORAL
  Filled 2022-06-27 (×5): qty 2

## 2022-06-27 MED ORDER — ALPRAZOLAM 0.5 MG PO TABS
0.5000 mg | ORAL_TABLET | Freq: Every evening | ORAL | Status: DC | PRN
  Administered 2022-06-28 (×2): 0.5 mg via ORAL
  Filled 2022-06-27 (×2): qty 1

## 2022-06-27 MED ORDER — POTASSIUM CHLORIDE CRYS ER 20 MEQ PO TBCR
40.0000 meq | EXTENDED_RELEASE_TABLET | Freq: Two times a day (BID) | ORAL | Status: DC
  Administered 2022-06-27 – 2022-07-02 (×10): 40 meq via ORAL
  Filled 2022-06-27 (×10): qty 2

## 2022-06-27 MED ORDER — HYDRALAZINE HCL 20 MG/ML IJ SOLN
10.0000 mg | Freq: Four times a day (QID) | INTRAMUSCULAR | Status: DC | PRN
  Administered 2022-06-27 – 2022-06-29 (×5): 10 mg via INTRAVENOUS
  Filled 2022-06-27 (×5): qty 1

## 2022-06-27 MED ORDER — OXYCODONE-ACETAMINOPHEN 5-325 MG PO TABS
1.0000 | ORAL_TABLET | Freq: Three times a day (TID) | ORAL | Status: DC | PRN
  Administered 2022-06-27 – 2022-07-01 (×6): 1 via ORAL
  Filled 2022-06-27 (×6): qty 1

## 2022-06-27 NOTE — Progress Notes (Addendum)
Patient ID: Eric Scott, male   DOB: 03-09-1951, 72 y.o.   MRN: [id number] S: Not feeling well today.  Still having trouble breathing. O:BP (!) 171/95   Pulse 96   Temp 99 F (37.2 C) (Axillary)   Resp (!) 21   Ht 6' (1.829 m)   Wt 127.5 kg   SpO2 96%   BMI 38.12 kg/m   Intake/Output Summary (Last 24 hours) at 06/27/2022 1044 Last data filed at 06/27/2022 3086 Gross per 24 hour  Intake 360 ml  Output 1301 ml  Net -941 ml   Intake/Output: I/O last 3 completed shifts: In: 0  Out: 1654 [Urine:1653; Stool:1]  Intake/Output this shift:  Total I/O In: 360 [P.O.:360] Out: 200 [Urine:200] Weight change:  Gen: chronically ill-appearing male in NAD CVS: RRR Resp: occ rhonchi Abd: +BS< soft, NT/ND Ext: 1+ pitting edema BLE  Recent Labs  Lab 06/21/22 0346 06/22/22 0353 06/23/22 0439 06/24/22 0503 06/25/22 0514 06/25/22 2102 06/26/22 0422 06/27/22 0429  NA 145 146* 145 144 140 139 140 142  K 3.8 3.4* 3.2* 3.0* 2.8* 3.3* 3.6 3.3*  CL 111 113* 112* 112* 109 106 111 110  CO2 '25 23 23 24 24 23 23 24  '$ GLUCOSE 101* 82 116* 129* 115* 153* 151* 107*  BUN 50* 45* 44* 39* 34* 32* 32* 33*  CREATININE 4.42* 4.10* 3.98* 3.72* 3.50* 3.52* 3.31* 3.35*  ALBUMIN 2.4* 2.4* 2.4* 2.2* 2.1* 2.3* 2.2* 2.2*  CALCIUM 8.4* 8.3* 8.2* 8.1* 7.6* 7.7* 7.8* 8.0*  PHOS 4.2 3.7 3.6 3.1 2.7  --  2.6 2.5  AST  --   --   --   --   --  15  --   --   ALT  --   --   --   --   --  9  --   --    Liver Function Tests: Recent Labs  Lab 06/25/22 2102 06/26/22 0422 06/27/22 0429  AST 15  --   --   ALT 9  --   --   ALKPHOS 64  --   --   BILITOT 0.8  --   --   PROT 6.8  --   --   ALBUMIN 2.3* 2.2* 2.2*   No results for input(s): "LIPASE", "AMYLASE" in the last 168 hours. No results for input(s): "AMMONIA" in the last 168 hours. CBC: Recent Labs  Lab 06/21/22 1113 06/22/22 0353 06/24/22 0503 06/26/22 0422 06/27/22 0429  WBC 10.8* 11.6* 13.3* 12.4* 11.6*  HGB 8.5* 8.4* 8.2* 8.1* 8.7*  HCT 28.6*  28.8* 27.8* 28.1* 29.0*  MCV 98.3 98.6 97.9 97.2 96.7  PLT 240 240 197 206 226   Cardiac Enzymes: No results for input(s): "CKTOTAL", "CKMB", "CKMBINDEX", "TROPONINI" in the last 168 hours. CBG: Recent Labs  Lab 06/26/22 1613 06/26/22 2128 06/27/22 0017 06/27/22 0415 06/27/22 0731  GLUCAP 99 121* 97 105* 115*    Iron Studies: No results for input(s): "IRON", "TIBC", "TRANSFERRIN", "FERRITIN" in the last 72 hours. Studies/Results: DG Chest Port 1 View  Result Date: 06/25/2022 CLINICAL DATA:  Hypoxia EXAM: PORTABLE CHEST 1 VIEW COMPARISON:  Chest x-ray 06/21/2022 FINDINGS: The heart is enlarged. Again seen is central pulmonary vascular congestion. There is a new small right pleural effusion. There are minimal patchy opacities at the lung bases. There is no evidence for pneumothorax or acute fracture. IMPRESSION: 1. Cardiomegaly with central pulmonary vascular congestion. 2. New small right pleural effusion. Electronically Signed   By: Ronney Asters  M.D.   On: 06/25/2022 21:24    amiodarone  200 mg Oral Daily   apixaban  5 mg Oral BID   calcitRIOL  0.25 mcg Oral Daily   Chlorhexidine Gluconate Cloth  6 each Topical Daily   darbepoetin (ARANESP) injection - NON-DIALYSIS  300 mcg Subcutaneous Q Tue-1800   FLUoxetine  40 mg Oral Daily   fluticasone furoate-vilanterol  1 puff Inhalation Daily   furosemide  80 mg Intravenous Daily   insulin aspart  0-9 Units Subcutaneous Q4H   metoprolol tartrate  25 mg Oral BID   mupirocin ointment   Nasal BID   nystatin  5 mL Oral QID   nystatin   Topical TID   mouth rinse  15 mL Mouth Rinse 4 times per day   pantoprazole  40 mg Intravenous Q12H   potassium chloride  40 mEq Oral Q3H   saccharomyces boulardii  250 mg Oral BID   senna-docusate  2 tablet Oral QHS   tamsulosin  0.4 mg Oral QPC supper   umeclidinium bromide  1 puff Inhalation Daily    BMET    Component Value Date/Time   NA 142 06/27/2022 0429   K 3.3 (L) 06/27/2022 0429   CL 110  06/27/2022 0429   CO2 24 06/27/2022 0429   GLUCOSE 107 (H) 06/27/2022 0429   BUN 33 (H) 06/27/2022 0429   CREATININE 3.35 (H) 06/27/2022 0429   CALCIUM 8.0 (L) 06/27/2022 0429   CALCIUM 9.2 01/31/2021 1311   GFRNONAA 19 (L) 06/27/2022 0429   GFRAA 16 (L) 12/21/2019 0633   CBC    Component Value Date/Time   WBC 11.6 (H) 06/27/2022 0429   RBC 3.00 (L) 06/27/2022 0429   HGB 8.7 (L) 06/27/2022 0429   HCT 29.0 (L) 06/27/2022 0429   PLT 226 06/27/2022 0429   MCV 96.7 06/27/2022 0429   MCH 29.0 06/27/2022 0429   MCHC 30.0 06/27/2022 0429   RDW 16.7 (H) 06/27/2022 0429   LYMPHSABS 0.4 (L) 06/18/2022 1917   MONOABS 0.4 06/18/2022 1917   EOSABS 0.3 06/18/2022 1917   BASOSABS 0.0 06/18/2022 1917    Assessment/Plan:   Acute on chronic hypoxic respiratory failure- now on 6L oxygen via [place] and given extra dose of IV lasix with some improvement.  Continue to diurese. AKI/CKD stage IV - Scr now back at baseline and stable despite IV diuresis.  Given worsening oxygen requirements, IVF's stopped and give IV lasix 80 mg with marked increase in UOP.  Continue with IV lasix 80 mg daily and may need to go to bid.  No indication for dialysis at this time.  Will continue to follow.  Pt is a poor dialysis candidate given multiple co-morbidities and poor functional status.  Consider Palliative care consult for goals/limits of care.   Acute on chronic metabolic encephalopathy - multifactorial with AKI, UTI, and polypharmacy.  Improving mentation. UTI - treated with ceftriaxone per primary ABLA on anemia of CKD stage IV - FOBT+, apixaban on hold.  Gi consulted due to melena.  EGD negative for acute bleed and colonoscopy on hold due ot respiratory status. Atrial flutter - on amiodarone and metoprolol.  Off of Eliquis for now due to melena and ABLA COPD - continue with nebulizers. HTN/volume  - stable and diuresing well. DM - per primary Hypokalemia - replete orally and follow.    Donetta Potts,  MD [facility]

## 2022-06-27 NOTE — Progress Notes (Signed)
Patient noted with SB to 64 and sustained for a little more than a minute until staff aroused. Provider called and made aware. Provider stated he will discontinue the metoprolol completely and cont. to monitor the patient for now. Patient HR is now 67 while awake and alert, denied chest pain or discomfort. Will endorse.

## 2022-06-27 NOTE — Consult Note (Signed)
Consultation Note Date: 06/27/2022   Patient Name: Eric Scott  DOB: 02-01-1951  MRN: [id number]  Age / Sex: 72 y.o., male  PCP: [name], MD Referring Physician: Roxan Hockey, MD  Reason for Consultation: Establishing goals of care  HPI/Patient Profile: 72 y.o. 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   Clinical Assessment and Goals of Care: I have reviewed medical records including EPIC notes, labs and imaging, received report from RN, assessed the patient.  Eric Scott is lying quietly in bed with bedside nursing staff present attending to needs.  He appears acutely/chronically ill, obese.  He is alert, oriented to self only at this time.  I am not sure that he can make his needs known.  There is no family present at this time.  Call to son, Odel Schmid, to discuss diagnosis prognosis, [place], EOL wishes, disposition and options.   No answer, left voicemail message. PMT will continue to reach out.  Conference with attending, bedside nursing staff, transition of care team related to patient condition, needs, goals of care, disposition.   HCPOA NEXT OF KIN -Eric Scott's tells me that his son, Eric, Scott., is his healthcare surrogate.    SUMMARY OF RECOMMENDATIONS   At this point continue to treat the treatable.   Time for outcomes. Anticipate return to [place]-term care at [facility].  Code Status/Advance  Care Planning: Full code - MOST form completed 2016 requesting full scope/full code.  Symptom Management:  Per hospitalist, no additional needs at this time.  Palliative Prophylaxis:  Frequent Pain Assessment, Oral Care, and Turn Reposition  Additional Recommendations (Limitations, Scope, Preferences): Full Scope Treatment  Psycho-social/Spiritual:  Desire for further Chaplaincy support:no Additional Recommendations: Caregiving  Support/Resources  Prognosis:  Unable to determine, based on outcomes.  Guarded at this point based on 3 hospital stays in the last 6 months, [place]-term care resident, chronic illness burden.  Discharge Planning: To be determined, anticipate return to [facility]term care facility, [facility]      Primary Diagnoses: Present on Admission:  Acute metabolic encephalopathy  AKI (acute kidney injury) ([place])  Atrial flutter, paroxysmal ([name])  Chronic heart failure with preserved ejection fraction (HFpEF) ([place])  Chronic venous insufficiency  COPD  GOLD ?  / AB   HTN (hypertension)  Stage 4 chronic kidney disease ([name])  UTI (urinary tract infection)  Acute on chronic anemia   I have reviewed the medical record, interviewed the patient and family, and examined the patient. The following aspects are pertinent.  Past Medical History:  Diagnosis Date   Anemia    Anxiety    Chronic pain  legs, back; MRI 05/2012 with mild thoracic degenerative changes no spinal stenosis    CKD (chronic kidney disease) 05/12/2019   Stage IV   COPD (chronic obstructive pulmonary disease) ([place])    COVID-19    Depression    Diabetic peripheral neuropathy ([name])    "chronic" (03/28/8526)   Diastolic CHF ([name])    Duodenal ulcer hemorrhage    DVT (deep venous thrombosis) ([name])    right upper arm   GERD (gastroesophageal reflux disease)    Hypercholesteremia    Hypertension    Iron deficiency anemia    Osteomyelitis of ankle ([name])    Peripheral edema    PVD (peripheral  vascular disease) ([name])    Spinal stenosis    mild lumbar (MRI 05/2012)-L2-L3 to L4-L5 , mild lumbar foraminal stenosis    Stroke ([place]) 05/2012   Subacute, lacunar infarcts within the left basal ganglia and posterior limp of the left internal capsule/thalamus; "RUE; both feet weak" (05/27/2012)   Tremor    Type II diabetes mellitus ([name])    Social History   Socioeconomic History   Marital status: Legally Separated    Spouse name: Not on file   Number of children: Not on file   Years of education: Not on file   Highest education level: Not on file  Occupational History   Not on file  Tobacco Use   Smoking status: Every Day    Packs/day: 0.10    Years: 54.00    Total pack years: 5.40    Types: Cigarettes    Last attempt to quit: 12/12/2019    Years since quitting: 2.5   Smokeless tobacco: Never   Tobacco comments:    A pack a week  Vaping Use   Vaping Use: Never used  Substance and Sexual Activity   Alcohol use: No    Comment: 05/27/2012 "drank gallons and gallons 20 yr ago or so; last drink  at least 10 yr ago"   Drug use: No   Sexual activity: Not Currently  Other Topics Concern   Not on file  Social History Narrative   Pt is living at [place] home for 6 years now    He is divorced. Has one kid.   Not working for about 7 years now. Used to do plumbing work.   Social Determinants of Health   Financial Resource Strain: Not on file  Food Insecurity: No Food Insecurity (06/19/2022)   Hunger Vital Sign    Worried About Running Out of Food in the Last Year: Never true    Ran Out of Food in the Last Year: Never true  Transportation Needs: No Transportation Needs (06/19/2022)   PRAPARE - Hydrologist (Medical): No    Lack of Transportation (Non-Medical): No  Physical Activity: Not on file  Stress: Not on file  Social Connections: Not on file   Family History  Problem Relation Age of Onset   Colon cancer Neg Hx    Scheduled Meds:   amiodarone  200 mg Oral Daily   apixaban  5 mg Oral BID   calcitRIOL  0.25 mcg Oral Daily   Chlorhexidine Gluconate Cloth  6 each Topical Daily   darbepoetin (ARANESP) injection - NON-DIALYSIS  300 mcg Subcutaneous Q Tue-1800   FLUoxetine  40 mg Oral Daily   fluticasone furoate-vilanterol  1 puff Inhalation Daily   furosemide  80 mg Intravenous Daily   insulin aspart  0-9 Units Subcutaneous Q4H   metoprolol tartrate  25 mg Oral BID   mupirocin ointment   Nasal BID   nystatin  5 mL Oral QID   nystatin   Topical TID   mouth rinse  15 mL Mouth Rinse 4 times per day   pantoprazole  40 mg Intravenous Q12H   potassium chloride  40 mEq Oral BID   saccharomyces boulardii  250 mg Oral BID   senna-docusate  2 tablet Oral QHS   tamsulosin  0.4 mg Oral QPC supper   umeclidinium bromide  1 puff Inhalation Daily   Continuous Infusions: PRN Meds:.acetaminophen **OR** acetaminophen, ALPRAZolam, ipratropium-albuterol, labetalol, mouth rinse, oxyCODONE-acetaminophen, polyethylene glycol Medications Prior to Admission:  Prior to Admission medications   Medication Sig Start Date End Date Taking? Authorizing Provider  acetaminophen (TYLENOL) 325 MG tablet Take 2 tablets (650 mg total) by mouth every 6 (six) hours as needed for mild pain, fever or headache (or Fever >/= 101). 04/10/22  Yes Elgergawy, Silver Huguenin, MD  albuterol (PROVENTIL) (2.5 MG/3ML) 0.083% nebulizer solution Take 3 mLs (2.5 mg total) by nebulization 3 (three) times daily. 04/20/22  Yes Ghimire, Henreitta Leber, MD  ALPRAZolam Duanne Moron) 0.5 MG tablet Take 1 tablet (0.5 mg total) by mouth at bedtime. 04/20/22  Yes Ghimire, Henreitta Leber, MD  Amino Acids-Protein Hydrolys (PRO-STAT AWC) LIQD Take 30 mLs by mouth daily.   Yes [provider]  amiodarone (PACERONE) 200 MG tablet Continue amiodarone 200 mg BID or 8 days (16 more doses), then transition to amiodarone 200 mg daily. Patient taking differently: Take 200 mg by mouth daily. 04/10/22  Yes  Elgergawy, Silver Huguenin, MD  amLODipine (NORVASC) 10 MG tablet Take 1 tablet (10 mg total) by mouth daily. 05/21/19  Yes Emokpae, Courage, MD  apixaban (ELIQUIS) 5 MG TABS tablet Take 1 tablet (5 mg total) by mouth 2 (two) times daily. 04/10/22  Yes Elgergawy, Silver Huguenin, MD  ascorbic acid (VITAMIN C) 500 MG tablet Take 500 mg by mouth 2 (two) times daily.   Yes [provider]  BREO ELLIPTA 100-25 MCG/INH AEPB Inhale 1 puff into the lungs daily.  09/27/16  Yes [provider]  calcitRIOL (ROCALTROL) 0.25 MCG capsule Take 0.25 mcg by mouth daily.   Yes [provider]  calcium carbonate (TUMS - DOSED IN MG ELEMENTAL CALCIUM) 500 MG chewable tablet Chew 1 tablet by mouth daily.   Yes [provider]  cholecalciferol (VITAMIN D) 1000 units tablet Take 1,000 Units by mouth daily.   Yes [provider]  coconut oil OIL Apply 1 Application topically every 6 (six) hours as needed (protection and prevention).   Yes [provider]  colchicine 0.6 MG tablet Take 0.5 tablets (0.3 mg total) by mouth daily. 04/10/22  Yes Elgergawy, Silver Huguenin, MD  cyanocobalamin 100 MCG tablet Take 1,000 mcg by mouth daily.   Yes [provider]  ferrous sulfate 325 (65 FE) MG tablet Take 325 mg by mouth 2 (two) times daily.   Yes [provider]  FLUoxetine (PROZAC) 40 MG capsule Take 40 mg by mouth daily.   Yes [provider]  gabapentin (NEURONTIN) 300 MG capsule Take 1 capsule (300 mg total) by mouth at bedtime. 12/21/19  Yes Barton Dubois, MD  guaiFENesin 200 MG tablet Take 200 mg by mouth 2 (two) times daily.   Yes [provider]  HUMALOG KWIKPEN 100 UNIT/ML KwikPen Inject 2-10 Units into the skin 3 (three) times daily before meals. Per sliding scale 04/20/22  Yes [provider]  ipratropium-albuterol (DUONEB) 0.5-2.5 (3) MG/3ML SOLN Take 3 mLs by nebulization every 4 (four) hours as needed (shortness of breath / wheezing).   Yes  [provider]  LANTUS 100 UNIT/ML injection Inject 0.22 mLs (22 Units total) into the skin at bedtime. 04/10/22  Yes Elgergawy, Silver Huguenin, MD  magnesium oxide (MAG-OX) 400 MG tablet Take 400 mg by mouth daily.   Yes [provider]  metoprolol tartrate (LOPRESSOR) 25 MG tablet Take 1 tablet (25 mg total) by mouth 2 (two) times daily. 04/10/22  Yes Elgergawy, Silver Huguenin, MD  oxyCODONE-acetaminophen (PERCOCET) 10-325 MG tablet Take 1 tablet by mouth every 8 (eight) hours as needed for pain.   Yes [provider]  polyethylene glycol (MIRALAX / GLYCOLAX) 17 g packet Take 17 g by mouth daily. For constipation   Yes [provider]  senna (SENOKOT) 8.6 MG tablet Take 2 tablets by mouth 2 (two) times daily.   Yes [provider]  senna-docusate (SENOKOT-S) 8.6-50 MG tablet Take 1 tablet by mouth at bedtime as needed for mild constipation. Patient taking differently: Take 2 tablets by mouth 2 (two) times daily. 04/20/22  Yes Ghimire, Henreitta Leber, MD  sodium zirconium cyclosilicate (LOKELMA) 10 g PACK packet Take 10 g by mouth 3 (three) times daily. 04/20/22  Yes Ghimire, Henreitta Leber, MD  tamsulosin (FLOMAX) 0.4 MG CAPS capsule Take 1 capsule (0.4 mg total) by mouth daily after supper. Patient taking differently: Take 0.4 mg by mouth daily. 11/18/19  Yes Emokpae, Courage, MD  tiotropium (SPIRIVA HANDIHALER) 18 MCG inhalation capsule Place 1 capsule (18 mcg total) into inhaler and inhale daily. 04/20/22 04/20/23 Yes Ghimire, Henreitta Leber, MD  torsemide (DEMADEX) 20 MG tablet Take 4 tablets (80 mg total) by mouth daily. Patient taking differently: Take 80 mg by mouth 2 (two) times daily. 04/20/22  Yes Ghimire, Henreitta Leber, MD  albuterol (PROVENTIL) (2.5 MG/3ML) 0.083% nebulizer solution Take 3 mLs (2.5 mg total) by nebulization every 6 (six) hours as needed for wheezing or shortness of breath. Patient not taking: Reported on 06/19/2022 04/20/22   Jonetta Osgood, MD   HYDROcodone-acetaminophen (NORCO) 7.5-325 MG tablet Take 1 tablet by mouth 4 (four) times daily. Patient not taking: Reported on 06/19/2022 04/20/22   Jonetta Osgood, MD  insulin aspart (NOVOLOG) 100 UNIT/ML injection Inject 2-10 Units into the skin See admin instructions. Sliding scale if 200-250 = 2 units, 251-300 = 4 units, 301-350 = 6 units, 351-400 = 8 units, 401-500 units = 10 units before meals and at betime Patient not taking: Reported on 04/24/2022    [provider]  Nutritional Supplement LIQD Take 120 mLs by mouth daily. "House supplement"    [provider]  pantoprazole (PROTONIX) 40 MG tablet Take 1 tablet (40 mg total) by mouth daily before breakfast. 05/21/19   Roxan Hockey, MD   Allergies  Allergen Reactions   Baclofen     unknown   Blueberry Flavor Other (See Comments)    Unknown   Cucumber Extract Other (See Comments)    "Fells like I'm having a heart attack"   Flexeril [Cyclobenzaprine Hcl] Other (See Comments)    "whole body  Tremors" (05/27/2012)   Kiwi Extract Other (See Comments)    "feels like I'm having a heart attack"   Review of Systems  Unable to perform ROS: Acuity of condition    Physical Exam Vitals and nursing note reviewed.  Constitutional:      General: He is not in acute  distress.    Appearance: He is obese. He is ill-appearing.  Cardiovascular:     Rate and Rhythm: Normal rate.  Pulmonary:     Effort: Pulmonary effort is normal. No respiratory distress.  Skin:    General: Skin is warm and dry.  Neurological:     Mental Status: He is alert.     Comments: Oriented to self only  Psychiatric:        Mood and Affect: Mood normal.        Behavior: Behavior normal.     Vital Signs: BP (!) 150/41   Pulse (!) 38   Temp 99 F (37.2 C) (Axillary)   Resp (!) 24   Ht 6' (1.829 m)   Wt 127.5 kg   SpO2 100%   BMI 38.12 kg/m  Pain Scale: 0-10   Pain Score: 8    SpO2: SpO2: 100 % O2 Device:SpO2: 100 % O2 Flow  Rate: .O2 Flow Rate (L/min): 6 L/min  IO: Intake/output summary:  Intake/Output Summary (Last 24 hours) at 06/27/2022 1449 Last data filed at 06/27/2022 0086 Gross per 24 hour  Intake 360 ml  Output 951 ml  Net -591 ml    LBM: Last BM Date : 06/26/22 Baseline Weight: Weight: 122.8 kg Most recent weight: Weight: 127.5 kg     Palliative Assessment/Data:     Time In: 1330 Time Out: 1410 Time Total: 40 minutes  Greater than 50%  of this time was spent counseling and coordinating care related to the above assessment and plan.  Signed by: Drue Novel, NP   Please contact Palliative Medicine Team phone at [phone] for questions and concerns.  For individual provider: See Shea Evans

## 2022-06-27 NOTE — Progress Notes (Signed)
Spoke to Patient's son Delmus Warwick. And provided Aniceto Boss NP number and asked Lincoln to call her in the morning about 9am. The son Elmor Kost. Agreed to call Tasha at [phone] tomorrow morning at 9am.

## 2022-06-27 NOTE — Progress Notes (Signed)
Speech Language Pathology Treatment: Dysphagia  Patient Details Name: Eric Scott MRN: [id number] DOB: [birth year] Today's Date: 06/27/2022 Time: 8921-1941 SLP Time Calculation (min) (ACUTE ONLY): 18 min  Assessment / Plan / Recommendation Clinical Impression  Pt seen for ongoing dysphagia intervention following BSE completed yesterday. Pt with improved alertness, however still visibly short of breath and tremulous. Pt consumed D2 and NTL this AM with limited intake. Pt required encouragement for intake of his lunch meal and reports no appetite. Pt requesting water and SLP provided trials of thin water via ice chip, tsp, and cup sip. Pt with seemingly improved performance from yesterday with ice chips and tsp thins. He still presents with reduced oral control with cup sips resulting in labial spillage and suspected premature spillage with occasional immediate cough. Pt with some throat clearing when taking NTL via straw sips. Overall intake was quite limited. Continue plan of care care SLP to follow up tomorrow. Above to RN.   HPI HPI: 72 year old male with a history of COPD and chronic respiratory failure on 3-4 L, HFpEF (EF 60-65%), CKD stage IV, diabetes mellitus type 2, atrial fibrillation, and left ankle osteomyelitis status post hardware removal and debridement (polymicrobial infection with Acinetobacter/Morganella/Enterococcus faecalis/Staph aureus) presented from [facility] secondary to altered mental status.  The patient was recently hospitalized from 04/12/2022 to 04/20/2022 secondary to acute on chronic respiratory failure due to COPD exacerbation, pneumonia and acute on chronic HFpEF.  During that hospitalization, the patient also had acute on chronic renal failure.. Underwent EGD 06/21/2022 no evidence of erosion, ulceration, or esophagitis in the esophagus.  There was evidence of gastritis and duodenitis. Pt with rapid response called over night and moved to ICU. BSE requested.       SLP Plan  Continue with current plan of care      Recommendations for follow up therapy are one component of a multi-disciplinary discharge planning process, led by the attending physician.  Recommendations may be updated based on patient status, additional functional criteria and insurance authorization.    Recommendations  Diet recommendations: Dysphagia 2 (fine chop);Nectar-thick liquid Liquids provided via: Cup;Teaspoon Medication Administration: Whole meds with puree Supervision: Staff to assist with self feeding;Full supervision/cueing for compensatory strategies Compensations: Slow rate;Small sips/bites;Multiple dry swallows after each bite/sip Postural Changes and/or Swallow Maneuvers: Seated upright 90 degrees;Upright 30-60 min after meal                Oral Care Recommendations: Oral care BID;Staff/trained caregiver to provide oral care Follow Up Recommendations: Skilled nursing-short term rehab (<3 hours/day) Assistance recommended at discharge: Frequent or constant Supervision/Assistance SLP Visit Diagnosis: Dysphagia, unspecified (R13.10) Plan: Continue with current plan of care         Thank you,  Genene Churn, [place]   [place]  06/27/2022, 1:14 PM

## 2022-06-27 NOTE — Progress Notes (Addendum)
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  The patient was recently hospitalized from 04/12/2022 to 04/20/2022 secondary to acute on chronic respiratory failure due to COPD exacerbation, pneumonia and acute on chronic HFpEF.  During that hospitalization, the patient also had acute on chronic renal failure.  He was felt to be hypervolemic and started on torsemide.   Per triage notes and ED provider who talked to staff at facility, at about 5 PM staff noticed that patient was confused.  Reported his blood sugars ranging in the 400s subsequently dropped to 68>> 130.  It is unknown if insulin was given when his blood sugars were in the 400s, a different staff had taken over patient's care.  Glucagon was given.  He has also had chronic left lower extremity pain which is unchanged. He also got his Lantus on the day of admission. In the ED, the patient was afebrile hemodynamically stable with oxygen saturation 92-95% on 4 L.  WBC 8.7, hemoglobin 8.1, platelets 274,000.  Sodium 143, potassium 3.9, bicarbonate 23, serum creatinine 5.02.   0.4 mg IV Narcan given, with some initial improvement in mental status, subsequent 0.8 mg Narcan given without significant response.  Patient was admitted for altered mental status and acute on chronic renal failure.   Assessment/Plan: Acute on chronic metabolic encephalopathy -Multifactorial including hypoglycemia,  AKI, polypharmacy in the setting of impaired renal function -Mentation improving, but not back to  baseline yet -CT brain negative -Continue IV diuresis -Continue holding gabapentin for now. -Continue constant reorientation.  Suspected UTI --Urine culture without growth patient was previously empirically treated with 5 days of Rocephin  Acute on chronic renal failure--CKD stage IV -Baseline creatinine 3.3-3.7 -Presented with serum creatinine 5.02 -Nephrology consult appreciated -IV fluids discontinued per nephrology; continue IV diuresis. -Continue close monitoring of renal function trend/stability. -Continue to minimize/avoid the use of nephrotoxic agents and follow creatinine. -No requiring hemodialysis at this point. -Creatinine 3.31 today (essentially back to baseline)..  Melena/FOBT positive -GI consultation appreciated. -holding apixaban temporarily -Continue NPO status. -had melena in ED at time of admission.  No further overt bleeding appreciated. -Continue PPI. -Status post endoscopy on 06/21/2022 without evidence of acute bleeding source. -Colonoscopy has remained on hold until outpatient follow-up; per GI okay to resume diet and restart Anticoagulation while closely monitoring for overt bleeding. -Continue to follow hemoglobin trend. -Hemoglobin currently trending back up  Paroxysmal atrial flutter -In and out of A-fib/flutter -Continue telemetry monitoring -Continue amiodarone  -Rate appears to be stable and well-controlled currently -Continue holding anticoagulation in the setting of melanotic stools and positive fecal occult blood test.   -No overt bleeding appreciated; hemoglobin stable -Per GI service advance diet and resume anticoagulation; they will follow patient in 4 weeks after discharge. -Decrease metoprolol to 12.5 mg twice daily due to propensity towards bradycardia  Polymicrobial left ankle osteomyelitis- -s/p debridement/hardware removal by Dr. Sharol Given on 03/29/23  -finished 6 weeks abx on 05/09/22 -finished linezolid 11/25-12/6 -Continue wound care  service recommendation.  Acute on chronic chronic respiratory failure with hypoxia and hypercarbia/COPD--suspect volume overload-- -chest x-ray from 06/25/2022  consistent with new small right pleural effusion -Nephrology consult appreciated continue IV Lasix -Continue to wean oxygen supplementation as tolerated -If there is any increased somnolence or concerns for obtundation will recommend checking ABG.  Patient at high risk for hypercapnia. -Continue to wean off oxygen supplementation as tolerated; currently down to 6L high flow nasal cannula. -Continue IV diuresis as per nephrology service recommendation. -At baseline patient usually uses 3 to 4 L of oxygen -Repeat chest x-ray on 06/28/2022  Controlled DM2  -03/28/22 A1C--5.8 -hypoglycemic in am 1/16 -Continue sliding scale insulin. -Close monitoring of patient's CBGs especially while n.p.o. for procedure.  Gout -Not in flare -Continue colchicine   Chronic peripheral neuropathy/chronic back pain -Continue as needed judicious use of analgesics.   -Continue to hold gabapentin temporarily   Mood disorder -Overall stable-continue Prozac -Continue holding alprazolam temporarily, encouraged to minimize extra sedative and continue constant reorientation.   1.6 cm left thyroid nodule -Incidental finding on CT chest -Further work-up deferred to outpatient setting.   7 mm left lower lobe nodule -Incidental finding on CT chest. -Repeat CT chest 6-12 months.   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   Morbid Obesity: -Body mass index is 38.12 kg/m. -Low-calorie diet, portion control and increase physical activity discussed with patient.  Left leg wound -Continue to follow recommendation from wound care service -No signs of acute superimposed infection appreciated. -Please see photos in epic from 06/27/2022  Generalized weakness and deconditioning--physical  therapy recommends SNF rehab  Family Communication:  no Family at bedside  Consultants:  renal  Code Status:  FULL   DVT Prophylaxis: SCDs.  Patient chronically on Eliquis; on hold in the setting of GI bleed. -May use subcu heparin 5,000 q 12 hrs for now   Procedures: As Listed in Progress Note Above  Antibiotics: Ceftriaxone 1/15>>06/23/22   Subjective: -unable to wean oxygen down currently at 6 L -Episodes of confusion and disorientation -Urine output starting to improve  Objective: Vitals:   06/27/22 1200 06/27/22 1300 06/27/22 1400 06/27/22 1658  BP: (!) 164/91 (!) 166/97 (!) 150/41 (!) 174/72  Pulse: 80 65 (!) 38   Resp: (!) 24 (!) 25 (!) 24   Temp:      TempSrc:      SpO2: 93% 91% 100%   Weight:      Height:        Intake/Output Summary (Last 24 hours) at 06/27/2022 1725 Last data filed at 06/27/2022 1500 Gross per 24 hour  Intake 600 ml  Output 1451 ml  Net -851 ml   Weight change:   Exam:  Physical Exam  Gen:- Awake Alert, chronically ill-appearing, in no acute distress  HEENT:- [place].AT, No sclera icterus Nose- Walker 6L/min Neck-Supple Neck,No JVD,.  Lungs-fair air movement, no wheezing  CV- S1, S2 normal, irregular not irregularly irregular  abd-  +ve B.Sounds, Abd Soft, No tenderness,    Extremity/Skin- good pedal pulses , lower extremity wounds, see photos in epic Psych-affect is flat, intermittent confusion and disorientation persist  neuro-General weakness, no new focal deficits, no tremors   Data Reviewed: I have personally reviewed following labs and imaging studies  Basic Metabolic Panel: Recent Labs  Lab 06/23/22 0439 06/24/22 0503 06/25/22 0514 06/25/22 2102 06/26/22 0422 06/27/22 0429  NA 145 144 140 139 140 142  K 3.2* 3.0* 2.8* 3.3* 3.6 3.3*  CL 112* 112* 109 106 111 110  CO2 '23 24 24 23 23 24  '$ GLUCOSE  116* 129* 115* 153* 151* 107*  BUN 44* 39* 34* 32* 32* 33*  CREATININE 3.98* 3.72* 3.50* 3.52* 3.31* 3.35*  CALCIUM 8.2*  8.1* 7.6* 7.7* 7.8* 8.0*  PHOS 3.6 3.1 2.7  --  2.6 2.5   Liver Function Tests: Recent Labs  Lab 06/24/22 0503 06/25/22 0514 06/25/22 2102 06/26/22 0422 06/27/22 0429  AST  --   --  15  --   --   ALT  --   --  9  --   --   ALKPHOS  --   --  64  --   --   BILITOT  --   --  0.8  --   --   PROT  --   --  6.8  --   --   ALBUMIN 2.2* 2.1* 2.3* 2.2* 2.2*   No results for input(s): "AMMONIA" in the last 168 hours.  CBC: Recent Labs  Lab 06/21/22 1113 06/22/22 0353 06/24/22 0503 06/26/22 0422 06/27/22 0429  WBC 10.8* 11.6* 13.3* 12.4* 11.6*  HGB 8.5* 8.4* 8.2* 8.1* 8.7*  HCT 28.6* 28.8* 27.8* 28.1* 29.0*  MCV 98.3 98.6 97.9 97.2 96.7  PLT 240 240 197 206 226   CBG: Recent Labs  Lab 06/27/22 0017 06/27/22 0415 06/27/22 0731 06/27/22 1221 06/27/22 1642  GLUCAP 97 105* 115* 121* 145*   Urine analysis:    Component Value Date/Time   COLORURINE YELLOW 06/18/2022 2027   APPEARANCEUR CLOUDY (A) 06/18/2022 2027   LABSPEC 1.010 06/18/2022 2027   PHURINE 5.0 06/18/2022 2027   GLUCOSEU NEGATIVE 06/18/2022 2027   HGBUR LARGE (A) 06/18/2022 2027   BILIRUBINUR NEGATIVE 06/18/2022 2027   [place] 06/18/2022 2027   PROTEINUR 30 (A) 06/18/2022 2027   UROBILINOGEN 0.2 03/04/2015 1835   NITRITE NEGATIVE 06/18/2022 2027   LEUKOCYTESUR LARGE (A) 06/18/2022 2027   Sepsis Labs:  Recent Results (from the past 240 hour(s))  Resp panel by RT-PCR (RSV, Flu A&B, Covid) Anterior Nasal Swab     Status: None   Collection Time: 06/18/22  7:13 PM   Specimen: Anterior Nasal Swab  Result Value Ref Range Status   SARS Coronavirus 2 by RT PCR NEGATIVE NEGATIVE Final    Comment: (NOTE) SARS-CoV-2 target nucleic acids are NOT DETECTED.  The SARS-CoV-2 RNA is generally detectable in upper respiratory specimens during the acute phase of infection. The lowest concentration of SARS-CoV-2 viral copies this assay can detect is 138 copies/mL. A negative result does not preclude  SARS-Cov-2 infection and should not be used as the sole basis for treatment or other patient management decisions. A negative result may occur with  improper specimen collection/handling, submission of specimen other than nasopharyngeal swab, presence of viral mutation(s) within the areas targeted by this assay, and inadequate number of viral copies(<138 copies/mL). A negative result must be combined with clinical observations, patient history, and epidemiological information. The expected result is Negative.  Fact Sheet for Patients:  EntrepreneurPulse.com.au  Fact Sheet for Healthcare Providers:  IncredibleEmployment.be  This test is no t yet approved or cleared by the [place] FDA and  has been authorized for detection and/or diagnosis of SARS-CoV-2 by FDA under an Emergency Use Authorization (EUA). This EUA will remain  in effect (meaning this test can be used) for the duration of the COVID-19 declaration under Section 564(b)(1) of the Act, 21 U.S.C.section 360bbb-3(b)(1), unless the authorization is terminated  or revoked sooner.       Influenza A by PCR NEGATIVE NEGATIVE Final  Influenza B by PCR NEGATIVE NEGATIVE Final    Comment: (NOTE) The Xpert Xpress SARS-CoV-2/FLU/RSV plus assay is intended as an aid in the diagnosis of influenza from Nasopharyngeal swab specimens and should not be used as a sole basis for treatment. Nasal washings and aspirates are unacceptable for Xpert Xpress SARS-CoV-2/FLU/RSV testing.  Fact Sheet for Patients: EntrepreneurPulse.com.au  Fact Sheet for Healthcare Providers: IncredibleEmployment.be  This test is not yet approved or cleared by the [place] FDA and has been authorized for detection and/or diagnosis of SARS-CoV-2 by FDA under an Emergency Use Authorization (EUA). This EUA will remain in effect (meaning this test can be used) for the duration of  the COVID-19 declaration under Section 564(b)(1) of the Act, 21 U.S.C. section 360bbb-3(b)(1), unless the authorization is terminated or revoked.     Resp Syncytial Virus by PCR NEGATIVE NEGATIVE Final    Comment: (NOTE) Fact Sheet for Patients: EntrepreneurPulse.com.au  Fact Sheet for Healthcare Providers: IncredibleEmployment.be  This test is not yet approved or cleared by the [place] FDA and has been authorized for detection and/or diagnosis of SARS-CoV-2 by FDA under an Emergency Use Authorization (EUA). This EUA will remain in effect (meaning this test can be used) for the duration of the COVID-19 declaration under Section 564(b)(1) of the Act, 21 U.S.C. section 360bbb-3(b)(1), unless the authorization is terminated or revoked.  Performed at [facility], [address] [place], [place] [address]   Urine Culture     Status: Abnormal   Collection Time: 06/18/22  8:27 PM   Specimen: Urine, Clean Catch  Result Value Ref Range Status   Specimen Description   Final    URINE, CLEAN CATCH Performed at [facility], [address], [place], [place] [address]    Special Requests   Final    NONE Performed at [facility], [address], [place], [place] [address]    Culture MULTIPLE SPECIES PRESENT, SUGGEST RECOLLECTION (A)  Final   Report Status 06/20/2022 FINAL  Final  MRSA Next Gen by PCR, Nasal     Status: Abnormal   Collection Time: 06/19/22  1:42 PM   Specimen: Nasal Mucosa; Nasal Swab  Result Value Ref Range Status   MRSA by PCR Next Gen DETECTED (A) NOT DETECTED Final    Comment: RESULT CALLED TO, READ BACK BY AND VERIFIED WITH: JENNIFER KING @ [phone] ON 06/20/22 C VARNER (NOTE) The GeneXpert MRSA Assay (FDA approved for NASAL specimens only), is one component of a comprehensive MRSA colonization surveillance program. It is not intended to diagnose MRSA infection nor to guide or monitor treatment for MRSA infections. Test  performance is not FDA approved in patients less than 38 years old. Performed at [facility], [address], [place], [place] [address]      Scheduled Meds:  amiodarone  200 mg Oral Daily   calcitRIOL  0.25 mcg Oral Daily   Chlorhexidine Gluconate Cloth  6 each Topical Daily   darbepoetin (ARANESP) injection - NON-DIALYSIS  300 mcg Subcutaneous Q Tue-1800   FLUoxetine  40 mg Oral Daily   fluticasone furoate-vilanterol  1 puff Inhalation Daily   furosemide  80 mg Intravenous BID   insulin aspart  0-9 Units Subcutaneous Q4H   metoprolol tartrate  12.5 mg Oral BID   mupirocin ointment   Nasal BID   nystatin  5 mL Oral QID   nystatin   Topical TID   mouth rinse  15 mL Mouth Rinse 4 times per day   pantoprazole  40 mg Intravenous Q12H   potassium chloride  40 mEq Oral BID   saccharomyces boulardii  250 mg Oral BID   senna-docusate  2 tablet Oral QHS   tamsulosin  0.4 mg Oral QPC supper   umeclidinium bromide  1 puff Inhalation Daily   Continuous Infusions:    Procedures/Studies: DG Chest Port 1 View  Result Date: 06/25/2022 CLINICAL DATA:  Hypoxia EXAM: PORTABLE CHEST 1 VIEW COMPARISON:  Chest x-ray 06/21/2022 FINDINGS: The heart is enlarged. Again seen is central pulmonary vascular congestion. There is a new small right pleural effusion. There are minimal patchy opacities at the lung bases. There is no evidence for pneumothorax or acute fracture. IMPRESSION: 1. Cardiomegaly with central pulmonary vascular congestion. 2. New small right pleural effusion. Electronically Signed   By: Ronney Asters M.D.   On: 06/25/2022 21:24   DG CHEST PORT 1 VIEW  Result Date: 06/21/2022 CLINICAL DATA:  Shortness of breath.  [id number] EXAM: PORTABLE CHEST 1 VIEW COMPARISON:  06/18/2022 FINDINGS: Cardiac enlargement with mild pulmonary vascular congestion. Linear scarring in the upper lungs. Diffuse interstitial pattern to the lungs is likely edema. Appearance is not significantly changed since prior  study. No pleural effusions. No pneumothorax. Mediastinal contours appear intact. Calcification of the aorta. IMPRESSION: Cardiac enlargement with pulmonary vascular congestion and mild interstitial edema similar to prior study. Electronically Signed   By: Lucienne Capers M.D.   On: 06/21/2022 18:26   DG Chest 1 View  Result Date: 06/18/2022 CLINICAL DATA:  Altered mental status. EXAM: CHEST  1 VIEW COMPARISON:  Chest radiograph 04/12/2022, CT chest 04/12/2022 FINDINGS: The heart is enlarged.  The upper mediastinal contours are normal. Previously seen opacity in the left upper lobe has essentially resolved, with mild residual patchy opacity which may reflect residual scarring. There is no new or worsening focal airspace disease. There are diffusely increased interstitial markings possibly reflecting mild pulmonary interstitial edema. There is no significant pleural effusion. There is no pneumothorax. Note that the smaller nodular opacity seen on the prior chest CT are not seen on the current study. There is no acute osseous abnormality. IMPRESSION: 1. Cardiomegaly with possible mild pulmonary interstitial edema. 2. Essentially resolved airspace opacity in the left upper lobe with probable residual scarring. 3. Additional smaller nodules seen on the prior chest CT are not seen on the current study. Please refer to that study for follow-up recommendations. Electronically Signed   By: Valetta Mole M.D.   On: 06/18/2022 20:09   DG Tibia/Fibula Left  Result Date: 06/18/2022 CLINICAL DATA:  Left lower extremity erythema EXAM: LEFT TIBIA AND FIBULA - 2 VIEW COMPARISON:  03/22/2022 FINDINGS: Frontal and lateral views of the left tibia and fibula are obtained. There is a residual screw traversing the medial malleolus. K-wire within the medial malleolus and the plate and screw fixation across the distal fibula have been removed in the interim. New heterotopic ossification along the dorsal margin of the distal fibula.  There are no acute displaced fractures. The bones are diffusely osteopenic. No evidence of bony destruction or periosteal reaction to suggest osteomyelitis. There is diffuse subcutaneous edema. IMPRESSION: 1. Diffuse subcutaneous edema consistent with cellulitis. 2. No radiographic evidence of osteomyelitis. 3. Diffuse osteopenia. 4. Revision of orthopedic hardware about the left ankle as above. Electronically Signed   By: Randa Ngo M.D.   On: 06/18/2022 20:06   CT Head Wo Contrast  Result Date: 06/18/2022 CLINICAL DATA:  Altered mental status EXAM: CT HEAD WITHOUT CONTRAST TECHNIQUE: Contiguous  axial images were obtained from the base of the skull through the vertex without intravenous contrast. RADIATION DOSE REDUCTION: This exam was performed according to the departmental dose-optimization program which includes automated exposure control, adjustment of the mA and/or kV according to patient size and/or use of iterative reconstruction technique. COMPARISON:  05/17/2019 FINDINGS: Brain: No mass, hemorrhage or extra-axial collection. There is periventricular hypoattenuation compatible with chronic microvascular disease. 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 Generalized volume loss. Vascular: Atherosclerotic calcification of the internal carotid and vertebral arteries at skull base. Skull: Normal. Negative for fracture or focal lesion. Sinuses/Orbits: No acute finding. Other: None. IMPRESSION: 1. No acute intracranial abnormality. 2. 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 Electronically Signed   By: Ulyses Jarred M.D.   On: 06/18/2022 19:50    Zamire Whitehurst Denton Brick, MD  [facility]  If 7PM-7AM, please contact night-coverage [email or web address] Password [email or web address] 06/27/2022, 5:25 PM   LOS: 9 days

## 2022-06-27 NOTE — Progress Notes (Signed)
Patient The patient is having vent bigeminy and frequent PVCs. He denied CP, but patient  looks pale and significantly different now compared to this morning. Patient refused lunch and not consuming fluids orally. 12 Lead ECG show sinus rhythm with frequent ventricular complexes. Patient has not had significant urine output since the '80mg'$  IV lasix was administered this morning. Bladder scan only showed 120cc. Hospitalist made aware. New orders noted. Will carry out the orders and  continue to monitor.

## 2022-06-27 NOTE — Progress Notes (Signed)
Daughter [name] called with several questions and agreed to speak with provider. Dr. Joesph Fillers made aware.

## 2022-06-28 ENCOUNTER — Inpatient Hospital Stay (HOSPITAL_COMMUNITY): Payer: Medicare (Managed Care)

## 2022-06-28 DIAGNOSIS — G9341 Metabolic encephalopathy: Secondary | ICD-10-CM | POA: Diagnosis not present

## 2022-06-28 DIAGNOSIS — J449 Chronic obstructive pulmonary disease, unspecified: Secondary | ICD-10-CM | POA: Diagnosis not present

## 2022-06-28 DIAGNOSIS — I5032 Chronic diastolic (congestive) heart failure: Secondary | ICD-10-CM | POA: Diagnosis not present

## 2022-06-28 DIAGNOSIS — N184 Chronic kidney disease, stage 4 (severe): Secondary | ICD-10-CM | POA: Diagnosis not present

## 2022-06-28 LAB — GLUCOSE, CAPILLARY
Glucose-Capillary: 112 mg/dL — ABNORMAL HIGH (ref 70–99)
Glucose-Capillary: 113 mg/dL — ABNORMAL HIGH (ref 70–99)
Glucose-Capillary: 161 mg/dL — ABNORMAL HIGH (ref 70–99)
Glucose-Capillary: 173 mg/dL — ABNORMAL HIGH (ref 70–99)
Glucose-Capillary: 226 mg/dL — ABNORMAL HIGH (ref 70–99)

## 2022-06-28 LAB — CBC
HCT: 28.2 % — ABNORMAL LOW (ref 39.0–52.0)
Hemoglobin: 8.3 g/dL — ABNORMAL LOW (ref 13.0–17.0)
MCH: 28.5 pg (ref 26.0–34.0)
MCHC: 29.4 g/dL — ABNORMAL LOW (ref 30.0–36.0)
MCV: 96.9 fL (ref 80.0–100.0)
Platelets: 232 10*3/uL (ref 150–400)
RBC: 2.91 MIL/uL — ABNORMAL LOW (ref 4.22–5.81)
RDW: 16.5 % — ABNORMAL HIGH (ref 11.5–15.5)
WBC: 11 10*3/uL — ABNORMAL HIGH (ref 4.0–10.5)
nRBC: 0 % (ref 0.0–0.2)

## 2022-06-28 LAB — RENAL FUNCTION PANEL
Albumin: 2.3 g/dL — ABNORMAL LOW (ref 3.5–5.0)
Anion gap: 10 (ref 5–15)
BUN: 34 mg/dL — ABNORMAL HIGH (ref 8–23)
CO2: 24 mmol/L (ref 22–32)
Calcium: 8.1 mg/dL — ABNORMAL LOW (ref 8.9–10.3)
Chloride: 111 mmol/L (ref 98–111)
Creatinine, Ser: 3.34 mg/dL — ABNORMAL HIGH (ref 0.61–1.24)
GFR, Estimated: 19 mL/min — ABNORMAL LOW (ref >60)
Glucose, Bld: 115 mg/dL — ABNORMAL HIGH (ref 70–99)
Phosphorus: 2.7 mg/dL (ref 2.5–4.6)
Potassium: 3.9 mmol/L (ref 3.5–5.1)
Sodium: 145 mmol/L (ref 135–145)

## 2022-06-28 MED ORDER — FUROSEMIDE 10 MG/ML IJ SOLN
80.0000 mg | Freq: Two times a day (BID) | INTRAMUSCULAR | Status: DC
  Administered 2022-06-28 – 2022-07-02 (×8): 80 mg via INTRAVENOUS
  Filled 2022-06-28 (×9): qty 8

## 2022-06-28 MED ORDER — APIXABAN 5 MG PO TABS
5.0000 mg | ORAL_TABLET | Freq: Two times a day (BID) | ORAL | Status: DC
  Administered 2022-06-29 – 2022-07-02 (×7): 5 mg via ORAL
  Filled 2022-06-28 (×7): qty 1

## 2022-06-28 MED ORDER — SODIUM CHLORIDE 0.9 % IV SOLN
125.0000 mg | Freq: Every day | INTRAVENOUS | Status: AC
  Administered 2022-06-28 – 2022-07-01 (×4): 125 mg via INTRAVENOUS
  Filled 2022-06-28 (×4): qty 10

## 2022-06-28 NOTE — Progress Notes (Signed)
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  The patient was recently hospitalized from 04/12/2022 to 04/20/2022 secondary to acute on chronic respiratory failure due to COPD exacerbation, pneumonia and acute on chronic HFpEF.  During that hospitalization, the patient also had acute on chronic renal failure.  He was felt to be hypervolemic and started on torsemide.   Per triage notes and ED provider who talked to staff at facility, at about 5 PM staff noticed that patient was confused.  Reported his blood sugars ranging in the 400s subsequently dropped to 68>> 130.  It is unknown if insulin was given when his blood sugars were in the 400s, a different staff had taken over patient's care.  Glucagon was given.  He has also had chronic left lower extremity pain which is unchanged. He also got his Lantus on the day of admission. In the ED, the patient was afebrile hemodynamically stable with oxygen saturation 92-95% on 4 L.  WBC 8.7, hemoglobin 8.1, platelets 274,000.  Sodium 143, potassium 3.9, bicarbonate 23, serum creatinine 5.02.   0.4 mg IV Narcan given, with some initial improvement in mental status, subsequent 0.8 mg Narcan given without significant response.  Patient was admitted for altered mental status and acute on chronic renal failure.   Assessment/Plan: Acute on chronic metabolic encephalopathy -Multifactorial including hypoglycemia,  AKI, polypharmacy in the setting of impaired renal function -CT brain negative -Continue holding  gabapentin for now. --Mentation improving, but not back to baseline yet -Continue constant reorientation.  Acute on chronic respiratory failure with volume overload/CHF exacerbation with pleural effusions in the setting of underlying COPD and chronic hypoxia and hypercarbia ---chest x-ray from 06/25/2022 consistent with new small right pleural effusion -Nephrology consult appreciated -At baseline uses 3 L of oxygen via nasal cannula -Currently requiring 6 L of oxygen via nasal cannula unable to wean down further -If there is any increased somnolence or concerns for obtundation will recommend checking ABG.  Patient at high risk for hypercapnia. --Repeat chest x-ray on 06/28/2022 continue to suggest CHF with pulmonary venous congestion and pleural effusion 06/28/22-change IV Lasix to 80 mg twice daily to try to achieve better diuresis  Acute on chronic renal failure--CKD stage IV -Baseline creatinine 3.3-3.7 -Presented with serum creatinine 5.02 -Nephrology consult appreciated -Initially received IV fluids, -Currently receiving escalating doses of Lasix due to volume overload/CHF concerns with acute on chronic hypoxic respiratory failure -Continue close monitoring of renal function trend/stability. -Continue to minimize/avoid the use of nephrotoxic agents and follow creatinine. -Creatinine currently back to baseline  Melena/FOBT positive -GI consultation appreciated. -EGD on 06/21/2022 with gastritis and duodenitis but no significant bleeding noted -GI service recommends holding off on colonoscopy given tenuous cardio-respiratory status -Per GI team okay to restart Eliquis and watch H&H and watch for bleeding -If no further bleeding after restarting Eliquis , he may be discharged and consider outpatient colonoscopy -Hgb stable at this time  Paroxysmal atrial flutter -In and out of A-fib/flutter -Continue telemetry monitoring -Continue amiodarone  -Rate appears to be stable and  well-controlled currently -Metoprolol discontinued due to symptomatic bradycardia -  Okay to restart apixaban and watch for bleeding as noted above  Polymicrobial left ankle osteomyelitis- -please see with pictures in epic -s/p debridement/hardware removal by Dr. Sharol Given on 03/29/23  -finished 6 weeks abx on 05/09/22 -finished linezolid 11/25-12/6 -Continue wound care service recommendation.  Controlled DM2  -03/28/22 A1C--5.8 -hypoglycemic in am 1/16 -Continue sliding scale insulin. -Close monitoring of patient's CBGs especially while n.p.o. for procedure.  Gout -Not in flare -Continue colchicine   Chronic peripheral neuropathy/chronic back pain -Continue as needed judicious use of analgesics.   -Continue to hold gabapentin temporarily   Mood disorder -Overall stable-continue Prozac -Continue holding alprazolam temporarily, encouraged to minimize extra sedative and continue constant reorientation.   1.6 cm left thyroid nodule -Incidental finding on CT chest -Further work-up deferred to outpatient setting.   7 mm left lower lobe nodule -Incidental finding on CT chest. -Repeat CT chest 6-12 months.   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   Morbid Obesity: -Body mass index is 38.12 kg/m. -Low-calorie diet, portion control and increase physical activity discussed with patient.  Left leg wound -Continue to follow recommendation from wound care service -No signs of acute superimposed infection appreciated. -Please see photos in epic from 06/27/2022  Generalized weakness and deconditioning--physical therapy recommends SNF rehab  Suspected UTI --Urine culture without growth patient was previously empirically treated with 5 days of Rocephin  Family Communication:  no Family at bedside  Consultants:  renal  Code Status:  FULL   DVT Prophylaxis: SCDs.  Patient chronically on Eliquis; on hold in the  setting of GI bleed. -May use subcu heparin 5,000 q 12 hrs for now   Procedures: As Listed in Progress Note Above  Antibiotics: Ceftriaxone 1/15>>06/23/22   Subjective: -Hypoxia and dyspnea persist --Voiding better -No chest pain no fevers  Objective: Vitals:   06/28/22 0600 06/28/22 0700 06/28/22 0800 06/28/22 0825  BP: (!) 170/53 (!) 152/45 (!) 150/44   Pulse: 75 77 80 80  Resp: '17 16 19 18  '$ Temp:   98.4 F (36.9 C)   TempSrc:   Oral   SpO2: 97% 98% 97% 95%  Weight:      Height:        Intake/Output Summary (Last 24 hours) at 06/28/2022 1136 Last data filed at 06/28/2022 0600 Gross per 24 hour  Intake 240 ml  Output 1600 ml  Net -1360 ml   Weight change:   Exam:  Physical Exam  Gen:- Awake Alert, chronically ill-appearing, in no acute distress  HEENT:- [place].AT, No sclera icterus Nose- [place] 6L/min Neck-Supple Neck,No JVD,.  Lungs-fair air movement, no wheezing  CV- S1, S2 normal, irregular not irregularly irregular  abd-  +ve B.Sounds, Abd Soft, No tenderness,    Extremity/Skin- good pedal pulses , lower extremity wounds, see photos in epic Psych-affect is flat, intermittent confusion and disorientation persist  neuro-General weakness, no new focal deficits, no tremors   Data Reviewed: I have personally reviewed following labs and imaging studies  Basic Metabolic Panel: Recent Labs  Lab 06/24/22 0503 06/25/22 0514 06/25/22 2102 06/26/22 0422 06/27/22 0429 06/28/22 0440  NA 144 140 139 140 142 145  K 3.0* 2.8* 3.3* 3.6 3.3* 3.9  CL 112* 109 106 111 110 111  CO2 '24 24 23 23 24 24  '$ GLUCOSE 129* 115* 153* 151* 107* 115*  BUN 39* 34* 32* 32* 33* 34*  CREATININE 3.72* 3.50* 3.52* 3.31* 3.35* 3.34*  CALCIUM 8.1* 7.6* 7.7* 7.8* 8.0*  8.1*  PHOS 3.1 2.7  --  2.6 2.5 2.7   Liver Function Tests: Recent Labs  Lab 06/25/22 0514 06/25/22 2102 06/26/22 0422 06/27/22 0429 06/28/22 0440  AST  --  15  --   --   --   ALT  --  9  --   --   --   ALKPHOS  --   64  --   --   --   BILITOT  --  0.8  --   --   --   PROT  --  6.8  --   --   --   ALBUMIN 2.1* 2.3* 2.2* 2.2* 2.3*   CBC: Recent Labs  Lab 06/22/22 0353 06/24/22 0503 06/26/22 0422 06/27/22 0429 06/28/22 0440  WBC 11.6* 13.3* 12.4* 11.6* 11.0*  HGB 8.4* 8.2* 8.1* 8.7* 8.3*  HCT 28.8* 27.8* 28.1* 29.0* 28.2*  MCV 98.6 97.9 97.2 96.7 96.9  PLT 240 197 206 226 232   CBG: Recent Labs  Lab 06/27/22 1642 06/27/22 1946 06/27/22 2348 06/28/22 0356 06/28/22 0813  GLUCAP 145* 125* 119* 112* 113*   Urine analysis:    Component Value Date/Time   COLORURINE YELLOW 06/18/2022 2027   APPEARANCEUR CLOUDY (A) 06/18/2022 2027   LABSPEC 1.010 06/18/2022 2027   PHURINE 5.0 06/18/2022 2027   GLUCOSEU NEGATIVE 06/18/2022 2027   HGBUR LARGE (A) 06/18/2022 2027   BILIRUBINUR NEGATIVE 06/18/2022 2027   KETONESUR NEGATIVE 06/18/2022 2027   PROTEINUR 30 (A) 06/18/2022 2027   UROBILINOGEN 0.2 03/04/2015 1835   NITRITE NEGATIVE 06/18/2022 2027   LEUKOCYTESUR LARGE (A) 06/18/2022 2027   Sepsis Labs:  Recent Results (from the past 240 hour(s))  Resp panel by RT-PCR (RSV, Flu A&B, Covid) Anterior Nasal Swab     Status: None   Collection Time: 06/18/22  7:13 PM   Specimen: Anterior Nasal Swab  Result Value Ref Range Status   SARS Coronavirus 2 by RT PCR NEGATIVE NEGATIVE Final    Comment: (NOTE) SARS-CoV-2 target nucleic acids are NOT DETECTED.  The SARS-CoV-2 RNA is generally detectable in upper respiratory specimens during the acute phase of infection. The lowest concentration of SARS-CoV-2 viral copies this assay can detect is 138 copies/mL. A negative result does not preclude SARS-Cov-2 infection and should not be used as the sole basis for treatment or other patient management decisions. A negative result may occur with  improper specimen collection/handling, submission of specimen other than nasopharyngeal swab, presence of viral mutation(s) within the areas targeted by this  assay, and inadequate number of viral copies(<138 copies/mL). A negative result must be combined with clinical observations, patient history, and epidemiological information. The expected result is Negative.  Fact Sheet for Patients:  EntrepreneurPulse.com.au  Fact Sheet for Healthcare Providers:  IncredibleEmployment.be  This test is no t yet approved or cleared by the [place] FDA and  has been authorized for detection and/or diagnosis of SARS-CoV-2 by FDA under an Emergency Use Authorization (EUA). This EUA will remain  in effect (meaning this test can be used) for the duration of the COVID-19 declaration under Section 564(b)(1) of the Act, 21 U.S.C.section 360bbb-3(b)(1), unless the authorization is terminated  or revoked sooner.       Influenza A by PCR NEGATIVE NEGATIVE Final   Influenza B by PCR NEGATIVE NEGATIVE Final    Comment: (NOTE) The Xpert Xpress SARS-CoV-2/FLU/RSV plus assay is intended as an aid in the diagnosis of influenza from Nasopharyngeal swab specimens and should not be used as a sole  basis for treatment. Nasal washings and aspirates are unacceptable for Xpert Xpress SARS-CoV-2/FLU/RSV testing.  Fact Sheet for Patients: EntrepreneurPulse.com.au  Fact Sheet for Healthcare Providers: IncredibleEmployment.be  This test is not yet approved or cleared by the [place] FDA and has been authorized for detection and/or diagnosis of SARS-CoV-2 by FDA under an Emergency Use Authorization (EUA). This EUA will remain in effect (meaning this test can be used) for the duration of the COVID-19 declaration under Section 564(b)(1) of the Act, 21 U.S.C. section 360bbb-3(b)(1), unless the authorization is terminated or revoked.     Resp Syncytial Virus by PCR NEGATIVE NEGATIVE Final    Comment: (NOTE) Fact Sheet for Patients: EntrepreneurPulse.com.au  Fact Sheet for  Healthcare Providers: IncredibleEmployment.be  This test is not yet approved or cleared by the [place] FDA and has been authorized for detection and/or diagnosis of SARS-CoV-2 by FDA under an Emergency Use Authorization (EUA). This EUA will remain in effect (meaning this test can be used) for the duration of the COVID-19 declaration under Section 564(b)(1) of the Act, 21 U.S.C. section 360bbb-3(b)(1), unless the authorization is terminated or revoked.  Performed at [facility], [address], [place], [place] [address]   Urine Culture     Status: Abnormal   Collection Time: 06/18/22  8:27 PM   Specimen: Urine, Clean Catch  Result Value Ref Range Status   Specimen Description   Final    URINE, CLEAN CATCH Performed at [facility], [address] [place], [place] [address]    Special Requests   Final    NONE Performed at [facility], [address], [place], [place] [address]    Culture MULTIPLE SPECIES PRESENT, SUGGEST RECOLLECTION (A)  Final   Report Status 06/20/2022 FINAL  Final  MRSA Next Gen by PCR, Nasal     Status: Abnormal   Collection Time: 06/19/22  1:42 PM   Specimen: Nasal Mucosa; Nasal Swab  Result Value Ref Range Status   MRSA by PCR Next Gen DETECTED (A) NOT DETECTED Final    Comment: RESULT CALLED TO, READ BACK BY AND VERIFIED WITH: JENNIFER KING @ [phone] ON 06/20/22 C VARNER (NOTE) The GeneXpert MRSA Assay (FDA approved for NASAL specimens only), is one component of a comprehensive MRSA colonization surveillance program. It is not intended to diagnose MRSA infection nor to guide or monitor treatment for MRSA infections. Test performance is not FDA approved in patients less than 70 years old. Performed at [facility], [address], [place], [place] [address]      Scheduled Meds:  amiodarone  200 mg Oral Daily   calcitRIOL  0.25 mcg Oral Daily   Chlorhexidine Gluconate Cloth  6 each Topical Daily   darbepoetin (ARANESP) injection  - NON-DIALYSIS  300 mcg Subcutaneous Q Tue-1800   FLUoxetine  40 mg Oral Daily   fluticasone furoate-vilanterol  1 puff Inhalation Daily   furosemide  80 mg Intravenous Q12H   heparin injection (subcutaneous)  5,000 Units Subcutaneous Q12H   insulin aspart  0-9 Units Subcutaneous Q4H   mupirocin ointment   Nasal BID   nystatin  5 mL Oral QID   nystatin   Topical TID   mouth rinse  15 mL Mouth Rinse 4 times per day   pantoprazole  40 mg Intravenous Q12H   potassium chloride  40 mEq Oral BID   saccharomyces boulardii  250 mg Oral BID   senna-docusate  2 tablet Oral QHS   tamsulosin  0.4 mg Oral QPC supper  umeclidinium bromide  1 puff Inhalation Daily   Continuous Infusions:  ferric gluconate (FERRLECIT) IVPB 125 mg (06/28/22 1050)     Procedures/Studies: DG CHEST PORT 1 VIEW  Result Date: 06/28/2022 CLINICAL DATA:  Dyspnea. EXAM: PORTABLE CHEST 1 VIEW COMPARISON:  June 25, 2022. FINDINGS: Stable cardiomegaly with mild central pulmonary vascular congestion. Bibasilar atelectasis or edema is noted with associated pleural effusions. Bony thorax is unremarkable. IMPRESSION: Stable cardiomegaly with mild central pulmonary vascular congestion and probable bilateral pulmonary edema and atelectasis. Probable bilateral pleural effusions. Electronically Signed   By: Marijo Conception M.D.   On: 06/28/2022 08:17   DG Chest Port 1 View  Result Date: 06/25/2022 CLINICAL DATA:  Hypoxia EXAM: PORTABLE CHEST 1 VIEW COMPARISON:  Chest x-ray 06/21/2022 FINDINGS: The heart is enlarged. Again seen is central pulmonary vascular congestion. There is a new small right pleural effusion. There are minimal patchy opacities at the lung bases. There is no evidence for pneumothorax or acute fracture. IMPRESSION: 1. Cardiomegaly with central pulmonary vascular congestion. 2. New small right pleural effusion. Electronically Signed   By: Ronney Asters M.D.   On: 06/25/2022 21:24   DG CHEST PORT 1 VIEW  Result  Date: 06/21/2022 CLINICAL DATA:  Shortness of breath.  [id number] EXAM: PORTABLE CHEST 1 VIEW COMPARISON:  06/18/2022 FINDINGS: Cardiac enlargement with mild pulmonary vascular congestion. Linear scarring in the upper lungs. Diffuse interstitial pattern to the lungs is likely edema. Appearance is not significantly changed since prior study. No pleural effusions. No pneumothorax. Mediastinal contours appear intact. Calcification of the aorta. IMPRESSION: Cardiac enlargement with pulmonary vascular congestion and mild interstitial edema similar to prior study. Electronically Signed   By: Lucienne Capers M.D.   On: 06/21/2022 18:26   DG Chest 1 View  Result Date: 06/18/2022 CLINICAL DATA:  Altered mental status. EXAM: CHEST  1 VIEW COMPARISON:  Chest radiograph 04/12/2022, CT chest 04/12/2022 FINDINGS: The heart is enlarged.  The upper mediastinal contours are normal. Previously seen opacity in the left upper lobe has essentially resolved, with mild residual patchy opacity which may reflect residual scarring. There is no new or worsening focal airspace disease. There are diffusely increased interstitial markings possibly reflecting mild pulmonary interstitial edema. There is no significant pleural effusion. There is no pneumothorax. Note that the smaller nodular opacity seen on the prior chest CT are not seen on the current study. There is no acute osseous abnormality. IMPRESSION: 1. Cardiomegaly with possible mild pulmonary interstitial edema. 2. Essentially resolved airspace opacity in the left upper lobe with probable residual scarring. 3. Additional smaller nodules seen on the prior chest CT are not seen on the current study. Please refer to that study for follow-up recommendations. Electronically Signed   By: Valetta Mole M.D.   On: 06/18/2022 20:09   DG Tibia/Fibula Left  Result Date: 06/18/2022 CLINICAL DATA:  Left lower extremity erythema EXAM: LEFT TIBIA AND FIBULA - 2 VIEW COMPARISON:  03/22/2022  FINDINGS: Frontal and lateral views of the left tibia and fibula are obtained. There is a residual screw traversing the medial malleolus. K-wire within the medial malleolus and the plate and screw fixation across the distal fibula have been removed in the interim. New heterotopic ossification along the dorsal margin of the distal fibula. There are no acute displaced fractures. The bones are diffusely osteopenic. No evidence of bony destruction or periosteal reaction to suggest osteomyelitis. There is diffuse subcutaneous edema. IMPRESSION: 1. Diffuse subcutaneous edema consistent with cellulitis. 2. No radiographic evidence of  osteomyelitis. 3. Diffuse osteopenia. 4. Revision of orthopedic hardware about the left ankle as above. Electronically Signed   By: Randa Ngo M.D.   On: 06/18/2022 20:06   CT Head Wo Contrast  Result Date: 06/18/2022 CLINICAL DATA:  Altered mental status EXAM: CT HEAD WITHOUT CONTRAST TECHNIQUE: Contiguous axial images were obtained from the base of the skull through the vertex without intravenous contrast. RADIATION DOSE REDUCTION: This exam was performed according to the departmental dose-optimization program which includes automated exposure control, adjustment of the mA and/or kV according to patient size and/or use of iterative reconstruction technique. COMPARISON:  05/17/2019 FINDINGS: Brain: No mass, hemorrhage or extra-axial collection. There is periventricular hypoattenuation compatible with chronic microvascular disease. 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 Generalized volume loss. Vascular: Atherosclerotic calcification of the internal carotid and vertebral arteries at skull base. Skull: Normal. Negative for fracture or focal lesion. Sinuses/Orbits: No acute finding. Other: None. IMPRESSION: 1. No acute intracranial abnormality. 2. 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 Electronically Signed   By: Ulyses Jarred M.D.   On: 06/18/2022 19:50    Nyzaiah Kai Denton Brick, MD  [facility]  If 7PM-7AM, please contact night-coverage [email or web address] Password [facility] 06/28/2022, 11:36 AM   LOS: 10 days

## 2022-06-28 NOTE — Progress Notes (Signed)
Palliative: Eric Scott is lying quietly in bed.  He appears acutely/chronically ill and quite frail.  He is alert, oriented to self only.  I am not sure that he can make his basic needs known.  There is no family at bedside at this time.  Call to son, Eric Scott, [place].  We talk at length about [name] chronic health burden.  Initially, [employer] shares that he was not aware that his father had a stroke.  He tells me that he realizes his father could have had a stroke back in 2013.  I asked what led Eric Scott to live in a nursing home.  Eric Scott stated, "he gave up".  He told tells me that his father could not walk.  Eric Scott tells me that his father has lived at [facility] since about 1610.  He had been in an ALF prior to that but was asked to leave because he was smoking in the bathroom.  We talk about fluid overload and the treatment plan.  We talk about respiratory issues and the treatment plan.  We talk about Eric Scott declines over the last few years in particular.  We talked about 3 hospital stays in the last 6 months.  I shared just as I will get sick again, so will [name].  We talk about CODE STATUS, "treat the treatable, but allowing natural passing".  Levent Scott states that he has not had these discussions with Eric Scott.  He tells me that Eric Scott left the family when Eric Scott was just a baby.  Kiptyn Scott shared that his father came back into his life when he was about 72 years of age.  We talked about the benefits of patient palliative services for continued goals of care discussions.  Willaim Scott is agreeable to outpatient palliative services to follow Eric Scott when he returns to [facility].  Provider choice offered, [facility].  Conference with attending, nephrology, bedside nursing staff, transition of care team related to patient condition, needs, goals of care, disposition.  Plan: Continue to treat the treatable.  Considering CODE  STATUS.  Return to [facility] where he has been since approximately 2016.  Son is agreeable to outpatient palliative services with [facility].   HCPOA: Eric Scott, is legal healthcare power of attorney.   [name] is Medical sales representative, although she was raised by Mr. Aprea.  Yiannis Scott states that [name] is "in charge" but is not legal healthcare power of attorney. Ka Flammer is Mr. Faison brother's wife, not his sister.  65 minutes, extended time Quinn Axe, NP Palliative medicine team Team phone [phone] Greater than 50% of this time was spent counseling and coordinating care related to the above assessment and plan.

## 2022-06-28 NOTE — Progress Notes (Signed)
Patient ID: Eric Scott, male   DOB: 1951-06-04, 72 y.o.   MRN: [id number]  S: Not feeling well today.  Still having trouble breathing.  Takes much effort for him to talk.   1800 UOP - crt stable  O:BP (!) 150/44   Pulse 80   Temp 98.4 F (36.9 C) (Oral)   Resp 19   Ht 6' (1.829 m)   Wt 127.5 kg   SpO2 97%   BMI 38.12 kg/m   Intake/Output Summary (Last 24 hours) at 06/28/2022 0819 Last data filed at 06/28/2022 0600 Gross per 24 hour  Intake 600 ml  Output 1800 ml  Net -1200 ml   Intake/Output: I/O last 3 completed shifts: In: 600 [P.O.:600] Out: 2550 [Urine:2550]  Intake/Output this shift:  No intake/output data recorded. Weight change:  Gen: chronically ill-appearing male in NAD CVS: RRR Resp: occ rhonchi Abd: +BS< soft, NT/ND Ext: 1+ pitting edema BLE  Recent Labs  Lab 06/22/22 0353 06/23/22 0439 06/24/22 0503 06/25/22 0514 06/25/22 2102 06/26/22 0422 06/27/22 0429 06/28/22 0440  NA 146* 145 144 140 139 140 142 145  K 3.4* 3.2* 3.0* 2.8* 3.3* 3.6 3.3* 3.9  CL 113* 112* 112* 109 106 111 110 111  CO2 '23 23 24 24 23 23 24 24  '$ GLUCOSE 82 116* 129* 115* 153* 151* 107* 115*  BUN 45* 44* 39* 34* 32* 32* 33* 34*  CREATININE 4.10* 3.98* 3.72* 3.50* 3.52* 3.31* 3.35* 3.34*  ALBUMIN 2.4* 2.4* 2.2* 2.1* 2.3* 2.2* 2.2* 2.3*  CALCIUM 8.3* 8.2* 8.1* 7.6* 7.7* 7.8* 8.0* 8.1*  PHOS 3.7 3.6 3.1 2.7  --  2.6 2.5 2.7  AST  --   --   --   --  15  --   --   --   ALT  --   --   --   --  9  --   --   --    Liver Function Tests: Recent Labs  Lab 06/25/22 2102 06/26/22 0422 06/27/22 0429 06/28/22 0440  AST 15  --   --   --   ALT 9  --   --   --   ALKPHOS 64  --   --   --   BILITOT 0.8  --   --   --   PROT 6.8  --   --   --   ALBUMIN 2.3* 2.2* 2.2* 2.3*   No results for input(s): "LIPASE", "AMYLASE" in the last 168 hours. No results for input(s): "AMMONIA" in the last 168 hours. CBC: Recent Labs  Lab 06/22/22 0353 06/24/22 0503 06/26/22 0422 06/27/22 0429  06/28/22 0440  WBC 11.6* 13.3* 12.4* 11.6* 11.0*  HGB 8.4* 8.2* 8.1* 8.7* 8.3*  HCT 28.8* 27.8* 28.1* 29.0* 28.2*  MCV 98.6 97.9 97.2 96.7 96.9  PLT 240 197 206 226 232   Cardiac Enzymes: No results for input(s): "CKTOTAL", "CKMB", "CKMBINDEX", "TROPONINI" in the last 168 hours. CBG: Recent Labs  Lab 06/27/22 1642 06/27/22 1946 06/27/22 2348 06/28/22 0356 06/28/22 0813  GLUCAP 145* 125* 119* 112* 113*    Iron Studies: No results for input(s): "IRON", "TIBC", "TRANSFERRIN", "FERRITIN" in the last 72 hours. Studies/Results: No results found.  amiodarone  200 mg Oral Daily   calcitRIOL  0.25 mcg Oral Daily   Chlorhexidine Gluconate Cloth  6 each Topical Daily   darbepoetin (ARANESP) injection - NON-DIALYSIS  300 mcg Subcutaneous Q Tue-1800   FLUoxetine  40 mg Oral Daily   fluticasone furoate-vilanterol  1 puff Inhalation Daily   furosemide  80 mg Intravenous Daily   heparin injection (subcutaneous)  5,000 Units Subcutaneous Q12H   insulin aspart  0-9 Units Subcutaneous Q4H   mupirocin ointment   Nasal BID   nystatin  5 mL Oral QID   nystatin   Topical TID   mouth rinse  15 mL Mouth Rinse 4 times per day   pantoprazole  40 mg Intravenous Q12H   potassium chloride  40 mEq Oral BID   saccharomyces boulardii  250 mg Oral BID   senna-docusate  2 tablet Oral QHS   tamsulosin  0.4 mg Oral QPC supper   umeclidinium bromide  1 puff Inhalation Daily    BMET    Component Value Date/Time   NA 145 06/28/2022 0440   K 3.9 06/28/2022 0440   CL 111 06/28/2022 0440   CO2 24 06/28/2022 0440   GLUCOSE 115 (H) 06/28/2022 0440   BUN 34 (H) 06/28/2022 0440   CREATININE 3.34 (H) 06/28/2022 0440   CALCIUM 8.1 (L) 06/28/2022 0440   CALCIUM 9.2 01/31/2021 1311   GFRNONAA 19 (L) 06/28/2022 0440   GFRAA 16 (L) 12/21/2019 0633   CBC    Component Value Date/Time   WBC 11.0 (H) 06/28/2022 0440   RBC 2.91 (L) 06/28/2022 0440   HGB 8.3 (L) 06/28/2022 0440   HCT 28.2 (L) 06/28/2022 0440    PLT 232 06/28/2022 0440   MCV 96.9 06/28/2022 0440   MCH 28.5 06/28/2022 0440   MCHC 29.4 (L) 06/28/2022 0440   RDW 16.5 (H) 06/28/2022 0440   LYMPHSABS 0.4 (L) 06/18/2022 1917   MONOABS 0.4 06/18/2022 1917   EOSABS 0.3 06/18/2022 1917   BASOSABS 0.0 06/18/2022 1917    Assessment/Plan:   Acute on chronic hypoxic respiratory failure- now on 6L oxygen via [place] and given extra dose of IV lasix with some improvement.  Continue to diurese-  currently on lasix 80 iv daily.  Baseline is 4 liters O2 AKI/CKD stage IV - Scr now back at baseline and stable despite IV diuresis.  Given worsening oxygen requirements, IVF's stopped and give IV lasix 80 mg with marked increase in UOP.  Continue with IV lasix 80 mg daily - will go to bid.  No indication for dialysis at this time.  Will continue to follow.  Pt is a poor dialysis candidate given multiple co-morbidities and poor functional status, also he has had 9 days of hospitalization and not really improved.   Appreciate Palliative care consult for goals/limits of care.   Acute on chronic metabolic encephalopathy - multifactorial with AKI, UTI, and polypharmacy.  Improving mentation. UTI - treated with ceftriaxone per primary ABLA on anemia of CKD stage IV - FOBT+, apixaban on hold.  Gi consulted due to melena.  EGD negative for acute bleed and colonoscopy on hold due ot respiratory status.  Iron low on 1/16, will give some IV iron -  given 300 darbe on 1/16 and 1/23 Atrial flutter - on amiodarone and metoprolol.  Off of Eliquis for now due to melena and ABLA COPD - continue with nebulizers. HTN/volume  - stable and diuresing well. DM - per primary Hypokalemia - replete orally and follow.   Louis Meckel  [employer]

## 2022-06-28 NOTE — Progress Notes (Signed)
Speech Language Pathology Treatment: Dysphagia  Patient Details Name: Eric Scott MRN: [id number] DOB: [birth year] Today's Date: 06/28/2022 Time: 4097-3532 SLP Time Calculation (min) (ACUTE ONLY): 21 min  Assessment / Plan / Recommendation Clinical Impression  Pt seen for ongoing dysphagia intervention. He continues to present with shortness of breath, tremor, and diminished appetite. Pt appeared to better tolerate thins today by ice chip, teaspoon, and cues for small sip with chin tuck. Pt with delayed cough when taking a regular cup sip of thin water (suspect premature spillage and reduced oral control and delayed swallow trigger). Pt would likely benefit from MBSS when clinically appropriate. He does appear stronger today. Continue D2 and NTL via cup sips/ok for straw and PO medications whole in puree. SLP will follow in acute setting and determine appropriateness for instrumental assessment.     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  The patient was recently hospitalized from 04/12/2022 to 04/20/2022 secondary to acute on chronic respiratory failure due to COPD exacerbation, pneumonia and acute on chronic HFpEF.  During that hospitalization, the patient also had acute on chronic renal failure.. Underwent EGD 06/21/2022 no evidence of erosion, ulceration, or esophagitis in the esophagus.  There was evidence of gastritis and duodenitis. Pt with rapid response called over night and moved to ICU. BSE requested.      SLP Plan  Continue with current plan of care      Recommendations for follow up therapy are one component of a  multi-disciplinary discharge planning process, led by the attending physician.  Recommendations may be updated based on patient status, additional functional criteria and insurance authorization.    Recommendations  Diet recommendations: Dysphagia 2 (fine chop);Nectar-thick liquid Liquids provided via: Cup;Teaspoon Medication Administration: Whole meds with puree Supervision: Staff to assist with self feeding;Full supervision/cueing for compensatory strategies Compensations: Slow rate;Small sips/bites;Multiple dry swallows after each bite/sip Postural Changes and/or Swallow Maneuvers: Seated upright 90 degrees;Upright 30-60 min after meal                Oral Care Recommendations: Oral care BID;Staff/trained caregiver to provide oral care Follow Up Recommendations: Skilled nursing-short term rehab (<3 hours/day) Assistance recommended at discharge: Frequent or constant Supervision/Assistance SLP Visit Diagnosis: Dysphagia, unspecified (R13.10) Plan: Continue with current plan of care          Thank you,  Genene Churn, [place]  06/28/2022, 3:46 PM

## 2022-06-29 DIAGNOSIS — G9341 Metabolic encephalopathy: Secondary | ICD-10-CM | POA: Diagnosis not present

## 2022-06-29 LAB — CBC
HCT: 28.6 % — ABNORMAL LOW (ref 39.0–52.0)
Hemoglobin: 8.6 g/dL — ABNORMAL LOW (ref 13.0–17.0)
MCH: 28.6 pg (ref 26.0–34.0)
MCHC: 30.1 g/dL (ref 30.0–36.0)
MCV: 95 fL (ref 80.0–100.0)
Platelets: 243 10*3/uL (ref 150–400)
RBC: 3.01 MIL/uL — ABNORMAL LOW (ref 4.22–5.81)
RDW: 16.7 % — ABNORMAL HIGH (ref 11.5–15.5)
WBC: 11.8 10*3/uL — ABNORMAL HIGH (ref 4.0–10.5)
nRBC: 0.3 % — ABNORMAL HIGH (ref 0.0–0.2)

## 2022-06-29 LAB — RENAL FUNCTION PANEL
Albumin: 2.1 g/dL — ABNORMAL LOW (ref 3.5–5.0)
Anion gap: 8 (ref 5–15)
BUN: 32 mg/dL — ABNORMAL HIGH (ref 8–23)
CO2: 25 mmol/L (ref 22–32)
Calcium: 8.1 mg/dL — ABNORMAL LOW (ref 8.9–10.3)
Chloride: 111 mmol/L (ref 98–111)
Creatinine, Ser: 3.18 mg/dL — ABNORMAL HIGH (ref 0.61–1.24)
GFR, Estimated: 20 mL/min — ABNORMAL LOW (ref >60)
Glucose, Bld: 127 mg/dL — ABNORMAL HIGH (ref 70–99)
Phosphorus: 1.9 mg/dL — ABNORMAL LOW (ref 2.5–4.6)
Potassium: 4.1 mmol/L (ref 3.5–5.1)
Sodium: 144 mmol/L (ref 135–145)

## 2022-06-29 LAB — GLUCOSE, CAPILLARY
Glucose-Capillary: 120 mg/dL — ABNORMAL HIGH (ref 70–99)
Glucose-Capillary: 122 mg/dL — ABNORMAL HIGH (ref 70–99)
Glucose-Capillary: 128 mg/dL — ABNORMAL HIGH (ref 70–99)
Glucose-Capillary: 145 mg/dL — ABNORMAL HIGH (ref 70–99)
Glucose-Capillary: 147 mg/dL — ABNORMAL HIGH (ref 70–99)
Glucose-Capillary: 150 mg/dL — ABNORMAL HIGH (ref 70–99)

## 2022-06-29 LAB — BRAIN NATRIURETIC PEPTIDE: B Natriuretic Peptide: 730 pg/mL — ABNORMAL HIGH (ref 0.0–100.0)

## 2022-06-29 MED ORDER — METOPROLOL TARTRATE 25 MG PO TABS
12.5000 mg | ORAL_TABLET | Freq: Two times a day (BID) | ORAL | Status: DC
  Administered 2022-06-29 – 2022-07-02 (×7): 12.5 mg via ORAL
  Filled 2022-06-29 (×7): qty 1

## 2022-06-29 MED ORDER — PANTOPRAZOLE SODIUM 40 MG PO TBEC
40.0000 mg | DELAYED_RELEASE_TABLET | Freq: Two times a day (BID) | ORAL | Status: DC
  Administered 2022-06-29 – 2022-07-02 (×6): 40 mg via ORAL
  Filled 2022-06-29 (×6): qty 1

## 2022-06-29 NOTE — Progress Notes (Signed)
Patient ID: Eric Scott, male   DOB: 10-16-1950, 72 y.o.   MRN: [id number]  S: Not feeling well today.  Still having trouble breathing.  Takes much effort for him to talk.   [place] stable to improved-  pt is now a DNR-  is down to his baseline O2 req it seems  O:BP (!) 171/90   Pulse (!) 115   Temp 98.6 F (37 C) (Axillary)   Resp 20   Ht 6' (1.829 m)   Wt 123.3 kg   SpO2 95%   BMI 36.87 kg/m   Intake/Output Summary (Last 24 hours) at 06/29/2022 0909 Last data filed at 06/29/2022 6761 Gross per 24 hour  Intake 1205 ml  Output 1900 ml  Net -695 ml   Intake/Output: I/O last 3 completed shifts: In: 43 [P.O.:960; IV Piggyback:125] Out: 3000 [Urine:3000]  Intake/Output this shift:  Total I/O In: 360 [P.O.:360] Out: -  Weight change:  Gen: chronically ill-appearing male in NAD CVS: RRR Resp: occ rhonchi Abd: +BS< soft, NT/ND Ext: 1+ pitting edema BLE  Recent Labs  Lab 06/23/22 0439 06/24/22 0503 06/25/22 0514 06/25/22 2102 06/26/22 0422 06/27/22 0429 06/28/22 0440 06/29/22 0433  NA 145 144 140 139 140 142 145 144  K 3.2* 3.0* 2.8* 3.3* 3.6 3.3* 3.9 4.1  CL 112* 112* 109 106 111 110 111 111  CO2 '23 24 24 23 23 24 24 25  '$ GLUCOSE 116* 129* 115* 153* 151* 107* 115* 127*  BUN 44* 39* 34* 32* 32* 33* 34* 32*  CREATININE 3.98* 3.72* 3.50* 3.52* 3.31* 3.35* 3.34* 3.18*  ALBUMIN 2.4* 2.2* 2.1* 2.3* 2.2* 2.2* 2.3* 2.1*  CALCIUM 8.2* 8.1* 7.6* 7.7* 7.8* 8.0* 8.1* 8.1*  PHOS 3.6 3.1 2.7  --  2.6 2.5 2.7 1.9*  AST  --   --   --  15  --   --   --   --   ALT  --   --   --  9  --   --   --   --    Liver Function Tests: Recent Labs  Lab 06/25/22 2102 06/26/22 0422 06/27/22 0429 06/28/22 0440 06/29/22 0433  AST 15  --   --   --   --   ALT 9  --   --   --   --   ALKPHOS 64  --   --   --   --   BILITOT 0.8  --   --   --   --   PROT 6.8  --   --   --   --   ALBUMIN 2.3*   < > 2.2* 2.3* 2.1*   < > = values in this interval not displayed.   No results for  input(s): "LIPASE", "AMYLASE" in the last 168 hours. No results for input(s): "AMMONIA" in the last 168 hours. CBC: Recent Labs  Lab 06/24/22 0503 06/26/22 0422 06/27/22 0429 06/28/22 0440 06/29/22 0433  WBC 13.3* 12.4* 11.6* 11.0* 11.8*  HGB 8.2* 8.1* 8.7* 8.3* 8.6*  HCT 27.8* 28.1* 29.0* 28.2* 28.6*  MCV 97.9 97.2 96.7 96.9 95.0  PLT 197 206 226 232 243   Cardiac Enzymes: No results for input(s): "CKTOTAL", "CKMB", "CKMBINDEX", "TROPONINI" in the last 168 hours. CBG: Recent Labs  Lab 06/28/22 1709 06/28/22 2000 06/29/22 0026 06/29/22 0437 06/29/22 0803  GLUCAP 173* 226* 128* 122* 120*    Iron Studies: No results for input(s): "IRON", "TIBC", "TRANSFERRIN", "FERRITIN" in the last  72 hours. Studies/Results: DG CHEST PORT 1 VIEW  Result Date: 06/28/2022 CLINICAL DATA:  Dyspnea. EXAM: PORTABLE CHEST 1 VIEW COMPARISON:  June 25, 2022. FINDINGS: Stable cardiomegaly with mild central pulmonary vascular congestion. Bibasilar atelectasis or edema is noted with associated pleural effusions. Bony thorax is unremarkable. IMPRESSION: Stable cardiomegaly with mild central pulmonary vascular congestion and probable bilateral pulmonary edema and atelectasis. Probable bilateral pleural effusions. Electronically Signed   By: Marijo Conception M.D.   On: 06/28/2022 08:17    amiodarone  200 mg Oral Daily   apixaban  5 mg Oral BID   calcitRIOL  0.25 mcg Oral Daily   Chlorhexidine Gluconate Cloth  6 each Topical Daily   darbepoetin (ARANESP) injection - NON-DIALYSIS  300 mcg Subcutaneous Q Tue-1800   FLUoxetine  40 mg Oral Daily   fluticasone furoate-vilanterol  1 puff Inhalation Daily   furosemide  80 mg Intravenous Q12H   insulin aspart  0-9 Units Subcutaneous Q4H   mupirocin ointment   Nasal BID   nystatin  5 mL Oral QID   nystatin   Topical TID   mouth rinse  15 mL Mouth Rinse 4 times per day   pantoprazole  40 mg Intravenous Q12H   potassium chloride  40 mEq Oral BID    saccharomyces boulardii  250 mg Oral BID   senna-docusate  2 tablet Oral QHS   tamsulosin  0.4 mg Oral QPC supper   umeclidinium bromide  1 puff Inhalation Daily    BMET    Component Value Date/Time   NA 144 06/29/2022 0433   K 4.1 06/29/2022 0433   CL 111 06/29/2022 0433   CO2 25 06/29/2022 0433   GLUCOSE 127 (H) 06/29/2022 0433   BUN 32 (H) 06/29/2022 0433   CREATININE 3.18 (H) 06/29/2022 0433   CALCIUM 8.1 (L) 06/29/2022 0433   CALCIUM 9.2 01/31/2021 1311   GFRNONAA 20 (L) 06/29/2022 0433   GFRAA 16 (L) 12/21/2019 0633   CBC    Component Value Date/Time   WBC 11.8 (H) 06/29/2022 0433   RBC 3.01 (L) 06/29/2022 0433   HGB 8.6 (L) 06/29/2022 0433   HCT 28.6 (L) 06/29/2022 0433   PLT 243 06/29/2022 0433   MCV 95.0 06/29/2022 0433   MCH 28.6 06/29/2022 0433   MCHC 30.1 06/29/2022 0433   RDW 16.7 (H) 06/29/2022 0433   LYMPHSABS 0.4 (L) 06/18/2022 1917   MONOABS 0.4 06/18/2022 1917   EOSABS 0.3 06/18/2022 1917   BASOSABS 0.0 06/18/2022 1917    Assessment/Plan:   Acute on chronic hypoxic respiratory failure- now back on 4L oxygen via [place].  Continue to diurese-  currently on lasix 80 iv bid.  Baseline is 4 liters O2 AKI/CKD stage IV - Scr now back at baseline and stable despite IV diuresis.  Given worsening oxygen requirements, IVF's stopped and give IV lasix 80 mg with marked increase in UOP.  Continue with IV lasix 80  bid.  No indication for dialysis at this time.  Will continue to follow.  Pt is a poor dialysis candidate given multiple co-morbidities and poor functional status, also he has had 9 days of hospitalization and not really improved.   Appreciate Palliative care consult for goals/limits of care- is now DNR.   Acute on chronic metabolic encephalopathy - multifactorial with AKI, UTI, and polypharmacy.  Improving mentation. UTI - treated with ceftriaxone per primary ABLA on anemia of CKD stage IV - FOBT+, apixaban on hold.  Gi consulted due to melena.  EGD negative  for acute bleed and colonoscopy on hold due ot respiratory status.  Iron low on 1/16, will give some IV iron -  given 300 darbe on 1/16 and 1/23 Atrial flutter - on amiodarone and metoprolol.  Off of Eliquis for now due to melena and ABLA COPD - continue with nebulizers. HTN/volume  - stable and diuresing well. DM - per primary Hypokalemia - replete orally and follow.   Renal is going to sign off-  would continue diuresis at current level-  replete K as needed-  as above dont consider pt a dialysis candidate.  Call with questions   [facility]

## 2022-06-29 NOTE — Progress Notes (Signed)
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  The patient was recently hospitalized from 04/12/2022 to 04/20/2022 secondary to acute on chronic respiratory failure due to COPD exacerbation, pneumonia and acute on chronic HFpEF.  During that hospitalization, the patient also had acute on chronic renal failure.  He was felt to be hypervolemic and started on torsemide.   Per triage notes and ED provider who talked to staff at facility, at about 5 PM staff noticed that patient was confused.  Reported his blood sugars ranging in the 400s subsequently dropped to 68>> 130.  It is unknown if insulin was given when his blood sugars were in the 400s, a different staff had taken over patient's care.  Glucagon was given.  He has also had chronic left lower extremity pain which is unchanged. He also got his Lantus on the day of admission. In the ED, the patient was afebrile hemodynamically stable with oxygen saturation 92-95% on 4 L.  WBC 8.7, hemoglobin 8.1, platelets 274,000.  Sodium 143, potassium 3.9, bicarbonate 23, serum creatinine 5.02.   0.4 mg IV Narcan given, with some initial improvement in mental status, subsequent 0.8 mg Narcan given without significant response.  Patient was admitted for altered mental status and acute on chronic renal failure.   Assessment/Plan: 1)Acute on Chronic Metabolic Encephalopathy -Multifactorial including hypoglycemia,  AKI, polypharmacy in the setting of impaired renal function -CT brain negative -Continue  holding gabapentin for now. --Mentation improving, but not back to baseline yet -Continue constant reorientation.  2)Acute on chronic respiratory failure with volume overload/CHF exacerbation with pleural effusions in the setting of underlying COPD and chronic hypoxia and hypercarbia ---chest x-ray from 06/25/2022 and repeat chest x-ray from 06/28/2022 consistent CHF with new small bilateral pleural effusion -Nephrology consult appreciated -At baseline uses 3 L of oxygen via nasal cannula -As of 06/29/2022 oxygen requirement improving weaned down from 6 L by nasal cannula to 4 L 06/28/22-changed IV Lasix to 80 mg twice daily to try to achieve better diuresis --BNP elevated to 730 on 06/29/2022 -Continue IV Lasix as above, patient is stable overloaded needs additional diuresis  3)Acute on chronic Renal Failure--CKD stage IV -Baseline creatinine 3.3-3.7 -Presented with serum creatinine 5.02---- --renal function actually improved with diuresis now renal function appears to be back to baseline -Nephrology consult appreciated -Initially received IV fluids, -Currently receiving escalating doses of Lasix due to volume overload/CHF concerns with acute on chronic hypoxic respiratory failure--see #2 above -Continue close monitoring of renal function trend/stability. -Continue to minimize/avoid the use of nephrotoxic agents and follow creatinine. -Creatinine currently back to baseline  4)Melena/FOBT positive -GI consultation appreciated. -EGD on 06/21/2022 with gastritis and duodenitis but no significant bleeding noted -GI service recommends holding off on colonoscopy given tenuous cardio-respiratory status -Per GI team okay to restart Eliquis and watch H&H and watch for bleeding -If no further bleeding after restarting Eliquis , he may be discharged and consider outpatient colonoscopy -Hgb stable at this time  5)Paroxysmal atrial flutter/Afib -In and out of A-fib/flutter -Continue telemetry  monitoring -Continue amiodarone  -Rate appears to be stable and  well-controlled currently -Restart low-dose metoprolol -Continue Eliquis as above #4  6)Polymicrobial left ankle osteomyelitis- -please see with pictures in epic -s/p debridement/hardware removal by Dr. Sharol Given on 03/29/23  -finished 6 weeks abx on 05/09/22 -finished linezolid 11/25-12/6 -Continue wound care service recommendation.  Controlled DM2  -03/28/22 A1C--5.8 -hypoglycemic in am 1/16 -Continue sliding scale insulin. -Close monitoring of patient's CBGs especially while n.p.o. for procedure.  Gout -Not in flare -Continue colchicine   Chronic peripheral neuropathy/chronic back pain -Continue as needed judicious use of analgesics.   -Continue to hold gabapentin temporarily   Mood disorder -Overall stable-continue Prozac -Continue holding alprazolam temporarily, encouraged to minimize extra sedative and continue constant reorientation.   1.6 cm left thyroid nodule -Incidental finding on CT chest -Further work-up deferred to outpatient setting.   7 mm left lower lobe nodule -Incidental finding on CT chest. -Repeat CT chest 6-12 months.   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   Morbid Obesity: -Body mass index is 36.87 kg/m. -Low-calorie diet, portion control and increase physical activity discussed with patient.  Left leg wound -Continue to follow recommendation from wound care service -No signs of acute superimposed infection appreciated. -Please see photos in epic from 06/27/2022  Generalized weakness and deconditioning--physical therapy recommends SNF rehab, plan to return [facility]  Suspected UTI --Urine culture without growth patient was previously empirically treated with 5 days of Rocephin  Family Communication:  no Family at bedside/ ---Son, Teaching laboratory technician, is legal healthcare power of attorney.   Eric Scott is Medical sales representative, although she was raised by Mr. Grabel.  Eric Scott states that Eric Scott is "in charge" but is not legal healthcare power of attorney. Eric Scott is Mr. Younglove brother's wife, not his sister.  Consultants: Nephrology and palliative care  Code Status:  FULL   DVT Prophylaxis: SCDs.  Patient chronically on Eliquis; on hold in the setting of GI bleed. -May use subcu heparin 5,000 q 12 hrs for now   Procedures: As Listed in Progress Note Above  Antibiotics: Ceftriaxone 1/15>>06/23/22   Subjective: -Hypoxia improving oxygen requirement down to 4 L from 6 L -Voiding well appears to be diuresing well -More awake and more interactive  Objective: Vitals:   06/29/22 1000 06/29/22 1100 06/29/22 1200 06/29/22 1300  BP: (!) 173/65 (!) 164/83 (!) 171/72 (!) 177/69  Pulse: (!) 111 (!) 113 (!) 115 (!) 114  Resp: 20 (!) 21 (!) 22 (!) 21  Temp:   97.6 F (36.4 C)   TempSrc:   Axillary   SpO2: 95% 94% 91% 95%  Weight:      Height:        Intake/Output Summary (Last 24 hours) at 06/29/2022 1311 Last data filed at 06/29/2022 1248 Gross per 24 hour  Intake 1192.11 ml  Output 2150 ml  Net -957.89 ml   Weight change:   Exam:  Physical Exam  Gen:- Awake Alert, chronically ill-appearing, in no acute distress  HEENT:- [place].AT, No sclera icterus Nose- [place] 6L/min Neck-Supple Neck,No JVD,.  Lungs-fair air movement, no wheezing  CV- S1, S2 normal, irregular not irregularly irregular  abd-  +ve B.Sounds, Abd Soft, No tenderness,    Extremity/Skin- good pedal pulses , lower extremity wounds, see photos in epic Psych-affect is flat, intermittent confusion and disorientation persist  neuro-General weakness, no new focal deficits, no tremors   Data Reviewed: I have personally reviewed following labs and imaging studies  Basic Metabolic Panel:  Recent Labs  Lab 06/25/22 0514 06/25/22 2102 06/26/22 0422 06/27/22 0429 06/28/22 0440 06/29/22 0433  NA 140  139 140 142 145 144  K 2.8* 3.3* 3.6 3.3* 3.9 4.1  CL 109 106 111 110 111 111  CO2 '24 23 23 24 24 25  '$ GLUCOSE 115* 153* 151* 107* 115* 127*  BUN 34* 32* 32* 33* 34* 32*  CREATININE 3.50* 3.52* 3.31* 3.35* 3.34* 3.18*  CALCIUM 7.6* 7.7* 7.8* 8.0* 8.1* 8.1*  PHOS 2.7  --  2.6 2.5 2.7 1.9*   Liver Function Tests: Recent Labs  Lab 06/25/22 2102 06/26/22 0422 06/27/22 0429 06/28/22 0440 06/29/22 0433  AST 15  --   --   --   --   ALT 9  --   --   --   --   ALKPHOS 64  --   --   --   --   BILITOT 0.8  --   --   --   --   PROT 6.8  --   --   --   --   ALBUMIN 2.3* 2.2* 2.2* 2.3* 2.1*   CBC: Recent Labs  Lab 06/24/22 0503 06/26/22 0422 06/27/22 0429 06/28/22 0440 06/29/22 0433  WBC 13.3* 12.4* 11.6* 11.0* 11.8*  HGB 8.2* 8.1* 8.7* 8.3* 8.6*  HCT 27.8* 28.1* 29.0* 28.2* 28.6*  MCV 97.9 97.2 96.7 96.9 95.0  PLT 197 206 226 232 243   CBG: Recent Labs  Lab 06/28/22 2000 06/29/22 0026 06/29/22 0437 06/29/22 0803 06/29/22 1111  GLUCAP 226* 128* 122* 120* 147*   Urine analysis:    Component Value Date/Time   COLORURINE YELLOW 06/18/2022 2027   APPEARANCEUR CLOUDY (A) 06/18/2022 2027   LABSPEC 1.010 06/18/2022 2027   PHURINE 5.0 06/18/2022 2027   GLUCOSEU NEGATIVE 06/18/2022 2027   HGBUR LARGE (A) 06/18/2022 2027   BILIRUBINUR NEGATIVE 06/18/2022 2027   KETONESUR NEGATIVE 06/18/2022 2027   PROTEINUR 30 (A) 06/18/2022 2027   UROBILINOGEN 0.2 03/04/2015 1835   NITRITE NEGATIVE 06/18/2022 2027   LEUKOCYTESUR LARGE (A) 06/18/2022 2027   Sepsis Labs:  Recent Results (from the past 240 hour(s))  MRSA Next Gen by PCR, Nasal     Status: Abnormal   Collection Time: 06/19/22  1:42 PM   Specimen: Nasal Mucosa; Nasal Swab  Result Value Ref Range Status   MRSA by PCR Next Gen DETECTED (A) NOT DETECTED Final    Comment: RESULT CALLED TO, READ BACK BY AND VERIFIED WITH: JENNIFER KING @ [phone] ON 06/20/22 C VARNER (NOTE) The GeneXpert MRSA Assay (FDA approved for NASAL  specimens only), is one component of a comprehensive MRSA colonization surveillance program. It is not intended to diagnose MRSA infection nor to guide or monitor treatment for MRSA infections. Test performance is not FDA approved in patients less than 12 years old. Performed at [facility], [address], [place], [place] [address]      Scheduled Meds:  amiodarone  200 mg Oral Daily   apixaban  5 mg Oral BID   calcitRIOL  0.25 mcg Oral Daily   Chlorhexidine Gluconate Cloth  6 each Topical Daily   darbepoetin (ARANESP) injection - NON-DIALYSIS  300 mcg Subcutaneous Q Tue-1800   FLUoxetine  40 mg Oral Daily   fluticasone furoate-vilanterol  1 puff Inhalation Daily   furosemide  80 mg Intravenous Q12H   insulin aspart  0-9 Units Subcutaneous Q4H   mupirocin ointment   Nasal BID   nystatin  5 mL Oral QID  nystatin   Topical TID   mouth rinse  15 mL Mouth Rinse 4 times per day   pantoprazole  40 mg Intravenous Q12H   potassium chloride  40 mEq Oral BID   saccharomyces boulardii  250 mg Oral BID   senna-docusate  2 tablet Oral QHS   tamsulosin  0.4 mg Oral QPC supper   umeclidinium bromide  1 puff Inhalation Daily   Continuous Infusions:  ferric gluconate (FERRLECIT) IVPB Stopped (06/29/22 1120)     Procedures/Studies: DG CHEST PORT 1 VIEW  Result Date: 06/28/2022 CLINICAL DATA:  Dyspnea. EXAM: PORTABLE CHEST 1 VIEW COMPARISON:  June 25, 2022. FINDINGS: Stable cardiomegaly with mild central pulmonary vascular congestion. Bibasilar atelectasis or edema is noted with associated pleural effusions. Bony thorax is unremarkable. IMPRESSION: Stable cardiomegaly with mild central pulmonary vascular congestion and probable bilateral pulmonary edema and atelectasis. Probable bilateral pleural effusions. Electronically Signed   By: Marijo Conception M.D.   On: 06/28/2022 08:17   DG Chest Port 1 View  Result Date: 06/25/2022 CLINICAL DATA:  Hypoxia EXAM: PORTABLE CHEST 1 VIEW COMPARISON:   Chest x-ray 06/21/2022 FINDINGS: The heart is enlarged. Again seen is central pulmonary vascular congestion. There is a new small right pleural effusion. There are minimal patchy opacities at the lung bases. There is no evidence for pneumothorax or acute fracture. IMPRESSION: 1. Cardiomegaly with central pulmonary vascular congestion. 2. New small right pleural effusion. Electronically Signed   By: Ronney Asters M.D.   On: 06/25/2022 21:24   DG CHEST PORT 1 VIEW  Result Date: 06/21/2022 CLINICAL DATA:  Shortness of breath.  [id number] EXAM: PORTABLE CHEST 1 VIEW COMPARISON:  06/18/2022 FINDINGS: Cardiac enlargement with mild pulmonary vascular congestion. Linear scarring in the upper lungs. Diffuse interstitial pattern to the lungs is likely edema. Appearance is not significantly changed since prior study. No pleural effusions. No pneumothorax. Mediastinal contours appear intact. Calcification of the aorta. IMPRESSION: Cardiac enlargement with pulmonary vascular congestion and mild interstitial edema similar to prior study. Electronically Signed   By: Lucienne Capers M.D.   On: 06/21/2022 18:26   DG Chest 1 View  Result Date: 06/18/2022 CLINICAL DATA:  Altered mental status. EXAM: CHEST  1 VIEW COMPARISON:  Chest radiograph 04/12/2022, CT chest 04/12/2022 FINDINGS: The heart is enlarged.  The upper mediastinal contours are normal. Previously seen opacity in the left upper lobe has essentially resolved, with mild residual patchy opacity which may reflect residual scarring. There is no new or worsening focal airspace disease. There are diffusely increased interstitial markings possibly reflecting mild pulmonary interstitial edema. There is no significant pleural effusion. There is no pneumothorax. Note that the smaller nodular opacity seen on the prior chest CT are not seen on the current study. There is no acute osseous abnormality. IMPRESSION: 1. Cardiomegaly with possible mild pulmonary interstitial edema. 2.  Essentially resolved airspace opacity in the left upper lobe with probable residual scarring. 3. Additional smaller nodules seen on the prior chest CT are not seen on the current study. Please refer to that study for follow-up recommendations. Electronically Signed   By: Valetta Mole M.D.   On: 06/18/2022 20:09   DG Tibia/Fibula Left  Result Date: 06/18/2022 CLINICAL DATA:  Left lower extremity erythema EXAM: LEFT TIBIA AND FIBULA - 2 VIEW COMPARISON:  03/22/2022 FINDINGS: Frontal and lateral views of the left tibia and fibula are obtained. There is a residual screw traversing the medial malleolus. K-wire within the medial malleolus and the plate and screw  fixation across the distal fibula have been removed in the interim. New heterotopic ossification along the dorsal margin of the distal fibula. There are no acute displaced fractures. The bones are diffusely osteopenic. No evidence of bony destruction or periosteal reaction to suggest osteomyelitis. There is diffuse subcutaneous edema. IMPRESSION: 1. Diffuse subcutaneous edema consistent with cellulitis. 2. No radiographic evidence of osteomyelitis. 3. Diffuse osteopenia. 4. Revision of orthopedic hardware about the left ankle as above. Electronically Signed   By: Randa Ngo M.D.   On: 06/18/2022 20:06   CT Head Wo Contrast  Result Date: 06/18/2022 CLINICAL DATA:  Altered mental status EXAM: CT HEAD WITHOUT CONTRAST TECHNIQUE: Contiguous axial images were obtained from the base of the skull through the vertex without intravenous contrast. RADIATION DOSE REDUCTION: This exam was performed according to the departmental dose-optimization program which includes automated exposure control, adjustment of the mA and/or kV according to patient size and/or use of iterative reconstruction technique. COMPARISON:  05/17/2019 FINDINGS: Brain: No mass, hemorrhage or extra-axial collection. There is periventricular hypoattenuation compatible with chronic microvascular  disease. 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 Generalized volume loss. Vascular: Atherosclerotic calcification of the internal carotid and vertebral arteries at skull base. Skull: Normal. Negative for fracture or focal lesion. Sinuses/Orbits: No acute finding. Other: None. IMPRESSION: 1. No acute intracranial abnormality. 2. 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 Electronically Signed   By: Ulyses Jarred M.D.   On: 06/18/2022 19:50    Avian Konigsberg Denton Brick, MD  [facility]  If 7PM-7AM, please contact night-coverage [email or web address] Password [email or web address] 06/29/2022, 1:11 PM   LOS: 11 days

## 2022-06-29 NOTE — Progress Notes (Signed)
Speech Language Pathology Treatment: Dysphagia  Patient Details Name: Eric Scott MRN: [id number] DOB: [birth year] Today's Date: 06/29/2022 Time: 0071-2197 SLP Time Calculation (min) (ACUTE ONLY): 19 min  Assessment / Plan / Recommendation Clinical Impression  Ongoing diagnostic dysphagia therapy provided today. Pt was easily roused and would remain alert for very brief periods; he continues with shortness of breath and to be quite tremulous. He could state his name but was unable to answer any other question or remain alert for more than 1-2 mins. He accepted small, controlled cup sips of thin liquids with s/sx of oropharyngeal dysphagia including delayed cough and wet vocal quality. Continue with D2/fine chop diet and NTL. Recommend hold instrumental assessment until Pt is appropriate. Likely plan MBSS for Monday 07/02/2021 pending Pt is appropriate.     HPI HPI: 72 year old male with a history of COPD and chronic respiratory failure on 3-4 L, HFpEF (EF 60-65%), CKD stage IV, diabetes mellitus type 2, atrial fibrillation, and left ankle osteomyelitis status post hardware removal and debridement (polymicrobial infection with Acinetobacter/Morganella/Enterococcus faecalis/Staph aureus) presented from [facility] secondary to altered mental status.  The patient was recently hospitalized from 04/12/2022 to 04/20/2022 secondary to acute on chronic respiratory failure due to COPD exacerbation, pneumonia and acute on chronic HFpEF.  During that hospitalization, the patient also had acute on chronic renal failure.. Underwent EGD 06/21/2022 no evidence of erosion, ulceration, or esophagitis in the esophagus.  There was evidence of gastritis and duodenitis. Pt with rapid response called over night and moved to ICU. BSE requested.      SLP Plan  Continue with current plan of care      Recommendations for follow up therapy are one component of a multi-disciplinary discharge planning process, led by the  attending physician.  Recommendations may be updated based on patient status, additional functional criteria and insurance authorization.    Recommendations  Diet recommendations: Dysphagia 2 (fine chop);Nectar-thick liquid Liquids provided via: Cup;Teaspoon Medication Administration: Whole meds with puree Supervision: Staff to assist with self feeding;Full supervision/cueing for compensatory strategies Compensations: Slow rate;Small sips/bites;Multiple dry swallows after each bite/sip Postural Changes and/or Swallow Maneuvers: Seated upright 90 degrees;Upright 30-60 min after meal                Oral Care Recommendations: Oral care BID;Staff/trained caregiver to provide oral care Follow Up Recommendations: Skilled nursing-short term rehab (<3 hours/day) Assistance recommended at discharge: Frequent or constant Supervision/Assistance SLP Visit Diagnosis: Dysphagia, unspecified (R13.10) Plan: Continue with current plan of care         Eric Scott H. Roddie Mc, CCC-SLP Speech Language Pathologist   Eric Scott  06/29/2022, 2:25 PM

## 2022-06-30 ENCOUNTER — Inpatient Hospital Stay (HOSPITAL_COMMUNITY): Payer: Medicare (Managed Care)

## 2022-06-30 DIAGNOSIS — G9341 Metabolic encephalopathy: Secondary | ICD-10-CM | POA: Diagnosis not present

## 2022-06-30 LAB — RENAL FUNCTION PANEL
Albumin: 2.1 g/dL — ABNORMAL LOW (ref 3.5–5.0)
Anion gap: 8 (ref 5–15)
BUN: 29 mg/dL — ABNORMAL HIGH (ref 8–23)
CO2: 25 mmol/L (ref 22–32)
Calcium: 8.1 mg/dL — ABNORMAL LOW (ref 8.9–10.3)
Chloride: 109 mmol/L (ref 98–111)
Creatinine, Ser: 3.17 mg/dL — ABNORMAL HIGH (ref 0.61–1.24)
GFR, Estimated: 20 mL/min — ABNORMAL LOW (ref >60)
Glucose, Bld: 127 mg/dL — ABNORMAL HIGH (ref 70–99)
Phosphorus: 2.1 mg/dL — ABNORMAL LOW (ref 2.5–4.6)
Potassium: 4.2 mmol/L (ref 3.5–5.1)
Sodium: 142 mmol/L (ref 135–145)

## 2022-06-30 LAB — CBC
HCT: 30.4 % — ABNORMAL LOW (ref 39.0–52.0)
Hemoglobin: 8.8 g/dL — ABNORMAL LOW (ref 13.0–17.0)
MCH: 27.9 pg (ref 26.0–34.0)
MCHC: 28.9 g/dL — ABNORMAL LOW (ref 30.0–36.0)
MCV: 96.5 fL (ref 80.0–100.0)
Platelets: 274 10*3/uL (ref 150–400)
RBC: 3.15 MIL/uL — ABNORMAL LOW (ref 4.22–5.81)
RDW: 16.6 % — ABNORMAL HIGH (ref 11.5–15.5)
WBC: 11.2 10*3/uL — ABNORMAL HIGH (ref 4.0–10.5)
nRBC: 0.4 % — ABNORMAL HIGH (ref 0.0–0.2)

## 2022-06-30 LAB — GLUCOSE, CAPILLARY
Glucose-Capillary: 109 mg/dL — ABNORMAL HIGH (ref 70–99)
Glucose-Capillary: 115 mg/dL — ABNORMAL HIGH (ref 70–99)
Glucose-Capillary: 132 mg/dL — ABNORMAL HIGH (ref 70–99)
Glucose-Capillary: 133 mg/dL — ABNORMAL HIGH (ref 70–99)
Glucose-Capillary: 133 mg/dL — ABNORMAL HIGH (ref 70–99)
Glucose-Capillary: 134 mg/dL — ABNORMAL HIGH (ref 70–99)
Glucose-Capillary: 153 mg/dL — ABNORMAL HIGH (ref 70–99)

## 2022-06-30 MED ORDER — K PHOS MONO-SOD PHOS DI & MONO 155-852-130 MG PO TABS
250.0000 mg | ORAL_TABLET | Freq: Three times a day (TID) | ORAL | Status: DC
  Administered 2022-06-30 – 2022-07-02 (×5): 250 mg via ORAL
  Filled 2022-06-30 (×5): qty 1

## 2022-06-30 NOTE — Progress Notes (Signed)
Report called and given to [facility] on Dept. [id number]. Pt to be transferred via bed.

## 2022-06-30 NOTE — Progress Notes (Signed)
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  The patient was recently hospitalized from 04/12/2022 to 04/20/2022 secondary to acute on chronic respiratory failure due to COPD exacerbation, pneumonia and acute on chronic HFpEF.  During that hospitalization, the patient also had acute on chronic renal failure.  He was felt to be hypervolemic and started on torsemide.   Per triage notes and ED provider who talked to staff at facility, at about 5 PM staff noticed that patient was confused.  Reported his blood sugars ranging in the 400s subsequently dropped to 68>> 130.  It is unknown if insulin was given when his blood sugars were in the 400s, a different staff had taken over patient's care.  Glucagon was given.  He has also had chronic left lower extremity pain which is unchanged. He also got his Lantus on the day of admission. In the ED, the patient was afebrile hemodynamically stable with oxygen saturation 92-95% on 4 L.  WBC 8.7, hemoglobin 8.1, platelets 274,000.  Sodium 143, potassium 3.9, bicarbonate 23, serum creatinine 5.02.   0.4 mg IV Narcan given, with some initial improvement in mental status, subsequent 0.8 mg Narcan given without significant response.  Patient was admitted for altered mental status and acute on chronic renal failure.   Assessment/Plan: 1)Acute on Chronic Metabolic Encephalopathy -Multifactorial including hypoglycemia,  AKI, polypharmacy in the setting of impaired renal function -CT brain negative -Continue  holding gabapentin for now. --Mentation improving, but not back to baseline yet -Continue constant reorientation.  2)Acute on chronic respiratory failure with volume overload/CHF exacerbation with pleural effusions in the setting of underlying COPD and chronic hypoxia and hypercarbia ---chest x-ray from 06/25/2022 and repeat chest x-ray from 06/28/2022 consistent CHF with new small bilateral pleural effusion -Nephrology consult appreciated -At baseline uses 3 L of oxygen via nasal cannula -As of 06/29/2022 oxygen requirement improving weaned down from 6 L by nasal cannula to 4 L 06/28/22-changed IV Lasix to 80 mg twice daily to try to achieve better diuresis --BNP elevated to 730 on 06/29/2022 -Continue IV Lasix as above, patient is still overloaded needs additional diuresis -Repeat CXR on 06/30/22 shows improving CHF  Hypoxia has improved now on 4 L/min   3)Acute on chronic Renal Failure--CKD stage IV -Baseline creatinine 3.3-3.7 -Presented with serum creatinine 5.02---- --renal function actually improved with diuresis now renal function appears to be back to baseline -Nephrology consult appreciated -Initially received IV fluids, -Currently receiving escalating doses of Lasix due to volume overload/CHF concerns with acute on chronic hypoxic respiratory failure--see #2 above -Continue close monitoring of renal function trend/stability. -Continue to minimize/avoid the use of nephrotoxic agents and follow creatinine. -Creatinine currently back to baseline  4)Melena/FOBT positive -GI consultation appreciated. -EGD on 06/21/2022 with gastritis and duodenitis but no significant bleeding noted -GI service recommends holding off on colonoscopy given tenuous cardio-respiratory status -Per GI team okay to restart Eliquis and watch H&H and watch for bleeding -If no further bleeding after restarting Eliquis , he may be discharged and consider outpatient colonoscopy -Hgb remains stable at this  time  5)Paroxysmal atrial flutter/Afib -  In and out of A-fib/flutter -Continue telemetry monitoring -Continue amiodarone  -Rate appears to be stable and well-controlled currently -c/n metoprolol 12.5 mg bid -Continue Eliquis as above #4  6)Polymicrobial left ankle osteomyelitis- -please see with pictures in epic -s/p debridement/hardware removal by Dr. Sharol Given on 03/29/23  -finished 6 weeks abx on 05/09/22 -finished linezolid 11/25-12/6 -Continue wound care service recommendation.  Controlled DM2  -03/28/22 A1C--5.8 -hypoglycemic in am 1/16 -Continue sliding scale insulin. -Close monitoring of patient's CBGs especially while n.p.o. for procedure.  Gout -Not in flare -Continue colchicine   Chronic peripheral neuropathy/chronic back pain -Continue as needed judicious use of analgesics.   -Continue to hold gabapentin temporarily   Mood disorder -Overall stable-continue Prozac -Continue holding alprazolam temporarily, encouraged to minimize extra sedative and continue constant reorientation.   1.6 cm left thyroid nodule -Incidental finding on CT chest -Further work-up deferred to outpatient setting.   7 mm left lower lobe nodule -Incidental finding on CT chest. -Repeat CT chest 6-12 months.   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   Morbid Obesity: -Body mass index is 35.22 kg/m. -Low-calorie diet, portion control and increase physical activity discussed with patient.  Left leg wound -Continue to follow recommendation from wound care service -No signs of acute superimposed infection appreciated. -Please see photos in epic from 06/27/2022  Generalized weakness and deconditioning--physical therapy recommends SNF rehab, plan to return to [facility]  Suspected UTI --Urine culture without growth patient was previously empirically treated with 5 days of Rocephin  Family Communication:  no Family  at bedside/ ---Son, Teaching laboratory technician, is legal healthcare power of attorney.   [name] is Medical sales representative, although she was raised by Mr. Chovanec.  Luisdaniel Kenton is Mr. Bushway brother's wife, not his sister.  Consultants: Nephrology and palliative care  Code Status:  FULL   DVT Prophylaxis: SCDs.  Patient chronically on Eliquis;   Procedures: As Listed in Progress Note Above  Antibiotics: Ceftriaxone 1/15>>06/23/22   Subjective: -Hypoxia improving oxygen requirement down to 4 L from 6 L -Voiding well appears to be diuresing well -More awake and more interactive Oral intake is fair -  Objective: Vitals:   06/30/22 0900 06/30/22 1006 06/30/22 1403 06/30/22 1716  BP: 124/76 (!) 153/74 (!) 145/67 (!) 164/70  Pulse: 93 93 89 83  Resp: '18 20 20 20  '$ Temp:  97.8 F (36.6 C) 98 F (36.7 C) 97.7 F (36.5 C)  TempSrc:  Oral Oral Oral  SpO2: 92% 99% 100% 100%  Weight:      Height:        Intake/Output Summary (Last 24 hours) at 06/30/2022 1905 Last data filed at 06/30/2022 1415 Gross per 24 hour  Intake 240 ml  Output 2200 ml  Net -1960 ml   Weight change: -5.5 kg  Exam:  Physical Exam  Gen:- Awake Alert, chronically ill-appearing, in no acute distress  HEENT:- [place].AT, No sclera icterus Nose- [place] 4L/min Neck-Supple Neck,No JVD,.  Lungs-fair air movement, no wheezing  CV- S1, S2 normal, irregular not irregularly irregular  abd-  +ve B.Sounds, Abd Soft, No tenderness,    Extremity/Skin- good pedal pulses , lower extremity wounds, see photos in epic Psych-affect is flat, intermittent confusion and disorientation persist  neuro-General weakness, no new focal deficits, no tremors   Data Reviewed: I have personally reviewed following labs and imaging studies  Basic Metabolic Panel: Recent Labs  Lab 06/26/22 0422 06/27/22 0429 06/28/22 0440 06/29/22 9169  06/30/22 0252  NA 140 142 145 144 142  K 3.6 3.3* 3.9 4.1 4.2  CL 111 110 111 111 109  CO2 '23 24 24 25 25   '$ GLUCOSE 151* 107* 115* 127* 127*  BUN 32* 33* 34* 32* 29*  CREATININE 3.31* 3.35* 3.34* 3.18* 3.17*  CALCIUM 7.8* 8.0* 8.1* 8.1* 8.1*  PHOS 2.6 2.5 2.7 1.9* 2.1*   Liver Function Tests: Recent Labs  Lab 06/25/22 2102 06/26/22 0422 06/27/22 0429 06/28/22 0440 06/29/22 0433 06/30/22 0252  AST 15  --   --   --   --   --   ALT 9  --   --   --   --   --   ALKPHOS 64  --   --   --   --   --   BILITOT 0.8  --   --   --   --   --   PROT 6.8  --   --   --   --   --   ALBUMIN 2.3* 2.2* 2.2* 2.3* 2.1* 2.1*   CBC: Recent Labs  Lab 06/26/22 0422 06/27/22 0429 06/28/22 0440 06/29/22 0433 06/30/22 0252  WBC 12.4* 11.6* 11.0* 11.8* 11.2*  HGB 8.1* 8.7* 8.3* 8.6* 8.8*  HCT 28.1* 29.0* 28.2* 28.6* 30.4*  MCV 97.2 96.7 96.9 95.0 96.5  PLT 206 226 232 243 274   CBG: Recent Labs  Lab 06/30/22 0012 06/30/22 0417 06/30/22 0749 06/30/22 1132 06/30/22 1611  GLUCAP 133* 134* 115* 153* 132*   Urine analysis:    Component Value Date/Time   COLORURINE YELLOW 06/18/2022 2027   APPEARANCEUR CLOUDY (A) 06/18/2022 2027   LABSPEC 1.010 06/18/2022 2027   PHURINE 5.0 06/18/2022 2027   GLUCOSEU NEGATIVE 06/18/2022 2027   HGBUR LARGE (A) 06/18/2022 2027   BILIRUBINUR NEGATIVE 06/18/2022 2027   KETONESUR NEGATIVE 06/18/2022 2027   PROTEINUR 30 (A) 06/18/2022 2027   UROBILINOGEN 0.2 03/04/2015 1835   NITRITE NEGATIVE 06/18/2022 2027   LEUKOCYTESUR LARGE (A) 06/18/2022 2027   Sepsis Labs:  No results found for this or any previous visit (from the past 240 hour(s)).    Scheduled Meds:  amiodarone  200 mg Oral Daily   apixaban  5 mg Oral BID   calcitRIOL  0.25 mcg Oral Daily   Chlorhexidine Gluconate Cloth  6 each Topical Daily   darbepoetin (ARANESP) injection - NON-DIALYSIS  300 mcg Subcutaneous Q Tue-1800   FLUoxetine  40 mg Oral Daily   fluticasone furoate-vilanterol  1 puff Inhalation Daily   furosemide  80 mg Intravenous Q12H   insulin aspart  0-9 Units Subcutaneous Q4H    metoprolol tartrate  12.5 mg Oral BID   mupirocin ointment   Nasal BID   nystatin  5 mL Oral QID   nystatin   Topical TID   mouth rinse  15 mL Mouth Rinse 4 times per day   pantoprazole  40 mg Oral BID   phosphorus  250 mg Oral TID   potassium chloride  40 mEq Oral BID   saccharomyces boulardii  250 mg Oral BID   senna-docusate  2 tablet Oral QHS   tamsulosin  0.4 mg Oral QPC supper   umeclidinium bromide  1 puff Inhalation Daily   Continuous Infusions:  ferric gluconate (FERRLECIT) IVPB 125 mg (06/30/22 1138)     Procedures/Studies: DG CHEST PORT 1 VIEW  Result Date: 06/30/2022 CLINICAL DATA:  Shortness of breath.  Respiratory abnormalities. EXAM: PORTABLE CHEST 1 VIEW COMPARISON:  Chest x-ray June 28, 2022 FINDINGS: Stable cardiomegaly. The hila and mediastinum are unchanged. No pneumothorax. Haziness over the right base remains but is improved. Haziness over the left base is also identified. Mild interstitial prominence. IMPRESSION: Findings suggest cardiomegaly, pulmonary venous congestion, and small layering effusions, improved on the right in the interval. Electronically Signed   By: Dorise Bullion III M.D.   On: 06/30/2022 09:55   DG CHEST PORT 1 VIEW  Result Date: 06/28/2022 CLINICAL DATA:  Dyspnea. EXAM: PORTABLE CHEST 1 VIEW COMPARISON:  June 25, 2022. FINDINGS: Stable cardiomegaly with mild central pulmonary vascular congestion. Bibasilar atelectasis or edema is noted with associated pleural effusions. Bony thorax is unremarkable. IMPRESSION: Stable cardiomegaly with mild central pulmonary vascular congestion and probable bilateral pulmonary edema and atelectasis. Probable bilateral pleural effusions. Electronically Signed   By: Marijo Conception M.D.   On: 06/28/2022 08:17   DG Chest Port 1 View  Result Date: 06/25/2022 CLINICAL DATA:  Hypoxia EXAM: PORTABLE CHEST 1 VIEW COMPARISON:  Chest x-ray 06/21/2022 FINDINGS: The heart is enlarged. Again seen is central pulmonary  vascular congestion. There is a new small right pleural effusion. There are minimal patchy opacities at the lung bases. There is no evidence for pneumothorax or acute fracture. IMPRESSION: 1. Cardiomegaly with central pulmonary vascular congestion. 2. New small right pleural effusion. Electronically Signed   By: Ronney Asters M.D.   On: 06/25/2022 21:24   DG CHEST PORT 1 VIEW  Result Date: 06/21/2022 CLINICAL DATA:  Shortness of breath.  [id number] EXAM: PORTABLE CHEST 1 VIEW COMPARISON:  06/18/2022 FINDINGS: Cardiac enlargement with mild pulmonary vascular congestion. Linear scarring in the upper lungs. Diffuse interstitial pattern to the lungs is likely edema. Appearance is not significantly changed since prior study. No pleural effusions. No pneumothorax. Mediastinal contours appear intact. Calcification of the aorta. IMPRESSION: Cardiac enlargement with pulmonary vascular congestion and mild interstitial edema similar to prior study. Electronically Signed   By: Lucienne Capers M.D.   On: 06/21/2022 18:26   DG Chest 1 View  Result Date: 06/18/2022 CLINICAL DATA:  Altered mental status. EXAM: CHEST  1 VIEW COMPARISON:  Chest radiograph 04/12/2022, CT chest 04/12/2022 FINDINGS: The heart is enlarged.  The upper mediastinal contours are normal. Previously seen opacity in the left upper lobe has essentially resolved, with mild residual patchy opacity which may reflect residual scarring. There is no new or worsening focal airspace disease. There are diffusely increased interstitial markings possibly reflecting mild pulmonary interstitial edema. There is no significant pleural effusion. There is no pneumothorax. Note that the smaller nodular opacity seen on the prior chest CT are not seen on the current study. There is no acute osseous abnormality. IMPRESSION: 1. Cardiomegaly with possible mild pulmonary interstitial edema. 2. Essentially resolved airspace opacity in the left upper lobe with probable residual  scarring. 3. Additional smaller nodules seen on the prior chest CT are not seen on the current study. Please refer to that study for follow-up recommendations. Electronically Signed   By: Valetta Mole M.D.   On: 06/18/2022 20:09   DG Tibia/Fibula Left  Result Date: 06/18/2022 CLINICAL DATA:  Left lower extremity erythema EXAM: LEFT TIBIA AND FIBULA - 2 VIEW COMPARISON:  03/22/2022 FINDINGS: Frontal and lateral views of the left tibia and fibula are obtained. There is a residual screw traversing the medial malleolus. K-wire within the medial malleolus and the plate and screw fixation across the distal fibula have been removed in the interim. New heterotopic ossification along the  dorsal margin of the distal fibula. There are no acute displaced fractures. The bones are diffusely osteopenic. No evidence of bony destruction or periosteal reaction to suggest osteomyelitis. There is diffuse subcutaneous edema. IMPRESSION: 1. Diffuse subcutaneous edema consistent with cellulitis. 2. No radiographic evidence of osteomyelitis. 3. Diffuse osteopenia. 4. Revision of orthopedic hardware about the left ankle as above. Electronically Signed   By: Randa Ngo M.D.   On: 06/18/2022 20:06   CT Head Wo Contrast  Result Date: 06/18/2022 CLINICAL DATA:  Altered mental status EXAM: CT HEAD WITHOUT CONTRAST TECHNIQUE: Contiguous axial images were obtained from the base of the skull through the vertex without intravenous contrast. RADIATION DOSE REDUCTION: This exam was performed according to the departmental dose-optimization program which includes automated exposure control, adjustment of the mA and/or kV according to patient size and/or use of iterative reconstruction technique. COMPARISON:  05/17/2019 FINDINGS: Brain: No mass, hemorrhage or extra-axial collection. There is periventricular hypoattenuation compatible with chronic microvascular disease. 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 Generalized volume  loss. Vascular: Atherosclerotic calcification of the internal carotid and vertebral arteries at skull base. Skull: Normal. Negative for fracture or focal lesion. Sinuses/Orbits: No acute finding. Other: None. IMPRESSION: 1. No acute intracranial abnormality. 2. 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 Electronically Signed   By: Ulyses Jarred M.D.   On: 06/18/2022 19:50    Breeonna Mone D[place] Brick, MD  [facility]  If 7PM-7AM, please contact night-coverage [email or web address] Password [email or web address] 06/30/2022, 7:05 PM   LOS: 12 days

## 2022-07-01 DIAGNOSIS — G9341 Metabolic encephalopathy: Secondary | ICD-10-CM | POA: Diagnosis not present

## 2022-07-01 LAB — GLUCOSE, CAPILLARY
Glucose-Capillary: 121 mg/dL — ABNORMAL HIGH (ref 70–99)
Glucose-Capillary: 122 mg/dL — ABNORMAL HIGH (ref 70–99)
Glucose-Capillary: 135 mg/dL — ABNORMAL HIGH (ref 70–99)
Glucose-Capillary: 141 mg/dL — ABNORMAL HIGH (ref 70–99)
Glucose-Capillary: 141 mg/dL — ABNORMAL HIGH (ref 70–99)

## 2022-07-01 LAB — RENAL FUNCTION PANEL
Albumin: 2.1 g/dL — ABNORMAL LOW (ref 3.5–5.0)
Anion gap: 9 (ref 5–15)
BUN: 30 mg/dL — ABNORMAL HIGH (ref 8–23)
CO2: 27 mmol/L (ref 22–32)
Calcium: 8.1 mg/dL — ABNORMAL LOW (ref 8.9–10.3)
Chloride: 106 mmol/L (ref 98–111)
Creatinine, Ser: 3.19 mg/dL — ABNORMAL HIGH (ref 0.61–1.24)
GFR, Estimated: 20 mL/min — ABNORMAL LOW (ref >60)
Glucose, Bld: 114 mg/dL — ABNORMAL HIGH (ref 70–99)
Phosphorus: 2.4 mg/dL — ABNORMAL LOW (ref 2.5–4.6)
Potassium: 3.9 mmol/L (ref 3.5–5.1)
Sodium: 142 mmol/L (ref 135–145)

## 2022-07-01 MED ORDER — SODIUM CHLORIDE 0.9 % IV SOLN: INTRAVENOUS | Status: DC

## 2022-07-01 NOTE — Progress Notes (Signed)
Pt A&O x 3. Pt irritable and refusing care. Pt would not allow me to check SP02 and attempted to hit me when I touched his finger. However, he did allow me to do wound care. Pt reported 5/10 pain, PRN tylenol given. I have asked pt if he is okay and he responds "no, I am not okay," but will not tell me why. I have attempted to talk to pt and he ignores me.

## 2022-07-01 NOTE — Progress Notes (Signed)
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  The patient was recently hospitalized from 04/12/2022 to 04/20/2022 secondary to acute on chronic respiratory failure due to COPD exacerbation, pneumonia and acute on chronic HFpEF.  During that hospitalization, the patient also had acute on chronic renal failure.  He was felt to be hypervolemic and started on torsemide.   Per triage notes and ED provider who talked to staff at facility, at about 5 PM staff noticed that patient was confused.  Reported his blood sugars ranging in the 400s subsequently dropped to 68>> 130.  It is unknown if insulin was given when his blood sugars were in the 400s, a different staff had taken over patient's care.  Glucagon was given.  He has also had chronic left lower extremity pain which is unchanged. He also got his Lantus on the day of admission. In the ED, the patient was afebrile hemodynamically stable with oxygen saturation 92-95% on 4 L.  WBC 8.7, hemoglobin 8.1, platelets 274,000.  Sodium 143, potassium 3.9, bicarbonate 23, serum creatinine 5.02.   0.4 mg IV Narcan given, with some initial improvement in mental status, subsequent 0.8 mg Narcan given without significant response.  Patient was admitted for altered mental status and acute on chronic renal failure.   07/01/22 Oral intake is fair, voiding well Respiratory status and oxygen requirement continues to improve -Occasional episodes of confusion and disorientation but overall mostly  oriented -Anticipate discharge to SNF on 07/02/2022  Assessment/Plan: 1)Acute on Chronic Metabolic Encephalopathy -Multifactorial including hypoglycemia,  AKI, polypharmacy in the setting of impaired renal function -CT brain negative -Continue holding gabapentin for now. --Mentation improving, but not back to baseline yet -Continue constant reorientation.  2)Acute on chronic respiratory failure with volume overload/CHF exacerbation with pleural effusions in the setting of underlying COPD and chronic hypoxia and hypercarbia ---chest x-ray from 06/25/2022 and repeat chest x-ray from 06/28/2022 consistent CHF with new small bilateral pleural effusion -Nephrology consult appreciated -At baseline uses 3 L of oxygen via nasal cannula -As of 06/29/2022 oxygen requirement improving weaned down from 6 L by nasal cannula to 4 L 06/28/22-changed IV Lasix to 80 mg twice daily to try to achieve better diuresis --BNP elevated to 730 on 06/29/2022 -Continue IV Lasix as above, patient is still overloaded needs additional diuresis -Repeat CXR on 06/30/22 shows improving CHF  Hypoxia has improved now on 4 L/min   3)Acute on chronic Renal Failure--CKD stage IV -Baseline creatinine 3.3-3.7 -Presented with serum creatinine 5.02---- --renal function actually improved with diuresis now renal function appears to be back to baseline -Nephrology consult appreciated -Initially received IV fluids, -Currently receiving escalating doses of Lasix due to volume overload/CHF concerns with acute on chronic hypoxic respiratory failure--see #2 above -Continue close monitoring of renal function trend/stability. -Continue to minimize/avoid the use of nephrotoxic agents and follow creatinine. -Creatinine currently back to baseline  4)Melena/FOBT positive -GI consultation appreciated. -EGD on 06/21/2022 with gastritis and duodenitis but no significant bleeding noted -GI service recommends holding off on colonoscopy given  tenuous cardio-respiratory status -Per GI team okay to restart Eliquis and  watch H&H and watch for bleeding -If no further bleeding after restarting Eliquis , he may be discharged and consider outpatient colonoscopy -Hgb remains stable at this time  5)Paroxysmal atrial flutter/Afib -In and out of A-fib/flutter -Continue telemetry monitoring -Continue amiodarone  -Rate appears to be stable and well-controlled currently -c/n metoprolol 12.5 mg bid -Continue Eliquis as above #4  6)Polymicrobial left ankle osteomyelitis- -please see with pictures in epic -s/p debridement/hardware removal by Dr. Sharol Given on 03/29/23  -finished 6 weeks abx on 05/09/22 -finished linezolid 11/25-12/6 -Continue wound care service recommendation.  7)Social/Ethics-----Son, Teaching laboratory technician, is legal healthcare power of attorney.   [name] is Medical sales representative, although she was raised by Mr. Say.  Mace Weinberg is Mr. Ozburn brother's wife, not his sister. -Patient is a DNR/DNI  Controlled DM2  -03/28/22 A1C--5.8 -Continue sliding scale insulin. Use Novolog/Humalog Sliding scale insulin with Accu-Cheks/Fingersticks as ordered   Gout -Not in flare -Continue colchicine   Chronic peripheral neuropathy/chronic back pain -Continue as needed judicious use of analgesics.   -Continue to hold gabapentin temporarily   Mood disorder -Overall stable-continue Prozac -Continue holding alprazolam temporarily, encouraged to minimize extra sedative and continue constant reorientation.   1.6 cm left thyroid nodule -Incidental finding on CT chest -Further work-up deferred to outpatient setting.   7 mm left lower lobe nodule -Incidental finding on CT chest. -Repeat CT chest 6-12 months.   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   Morbid Obesity: -Body mass index is 35.22 kg/m. -Low-calorie diet, portion control and increase physical activity  discussed with patient.  Left leg wound -Continue to follow recommendation from wound care service -No signs of acute superimposed infection appreciated. -Please see photos in epic from 06/27/2022  Generalized Weakness and Deconditioning--physical therapy recommends SNF rehab, plan to return to [facility]  Suspected UTI --Urine culture without growth patient was previously empirically treated with 5 days of Rocephin  Family Communication:  no Family at bedside/ ---Son, Teaching laboratory technician, is legal healthcare power of attorney.   [name] is Medical sales representative, although she was raised by Mr. Stanco.  Linley Moxley is Mr. Mauch brother's wife, not his sister.  Consultants: Nephrology and palliative care  Code Status:  FULL   DVT Prophylaxis: SCDs.  Patient chronically on Eliquis;   Procedures: As Listed in Progress Note Above  Antibiotics: Ceftriaxone 1/15>>06/23/22   Subjective: - 07/01/22 Oral intake is fair, voiding well Respiratory status and oxygen requirement continues to improve -Occasional episodes of confusion and disorientation but overall mostly oriented -Anticipate discharge to SNF on 07/02/2022  Objective: Vitals:   06/30/22 1716 06/30/22 2205 07/01/22 0438 07/01/22 1435  BP: (!) 164/70 (!) 152/78 (!) 159/82 (!) 154/108  Pulse: 83 90 90 86  Resp: '20 20 20 20  '$ Temp: 97.7 F (36.5 C) (!) 97.5 F (36.4 C) 97.8 F (36.6 C) 97.9 F (36.6 C)  TempSrc: Oral Oral Oral Oral  SpO2: 100% 97%  91%  Weight:      Height:        Intake/Output Summary (Last 24 hours) at 07/01/2022 1836 Last data filed at 07/01/2022 1300 Gross per 24 hour  Intake 350.28 ml  Output 2502 ml  Net -2151.72 ml   Weight change:   Exam:  Physical Exam  Gen:- Awake Alert, chronically ill-appearing, in no acute distress  HEENT:- [place].AT, No sclera icterus Nose- [employer] 4L/min Neck-Supple Neck,No JVD,.  Lungs-fair air movement, no wheezing  CV- S1, S2  normal, irregular not irregularly  irregular  abd-  +ve B.Sounds, Abd Soft, No tenderness,    Extremity/Skin- good pedal pulses , healing lower extremity wounds, see photos in epic Psych-affect is appropriate, intermittent confusion and disorientation persist  neuro-Generalized weakness, no new focal deficits, no tremors   Data Reviewed: I have personally reviewed following labs and imaging studies  Basic Metabolic Panel: Recent Labs  Lab 06/27/22 0429 06/28/22 0440 06/29/22 0433 06/30/22 0252 07/01/22 0422  NA 142 145 144 142 142  K 3.3* 3.9 4.1 4.2 3.9  CL 110 111 111 109 106  CO2 '24 24 25 25 27  '$ GLUCOSE 107* 115* 127* 127* 114*  BUN 33* 34* 32* 29* 30*  CREATININE 3.35* 3.34* 3.18* 3.17* 3.19*  CALCIUM 8.0* 8.1* 8.1* 8.1* 8.1*  PHOS 2.5 2.7 1.9* 2.1* 2.4*   Liver Function Tests: Recent Labs  Lab 06/25/22 2102 06/26/22 0422 06/27/22 0429 06/28/22 0440 06/29/22 0433 06/30/22 0252 07/01/22 0422  AST 15  --   --   --   --   --   --   ALT 9  --   --   --   --   --   --   ALKPHOS 64  --   --   --   --   --   --   BILITOT 0.8  --   --   --   --   --   --   PROT 6.8  --   --   --   --   --   --   ALBUMIN 2.3*   < > 2.2* 2.3* 2.1* 2.1* 2.1*   < > = values in this interval not displayed.   CBC: Recent Labs  Lab 06/26/22 0422 06/27/22 0429 06/28/22 0440 06/29/22 0433 06/30/22 0252  WBC 12.4* 11.6* 11.0* 11.8* 11.2*  HGB 8.1* 8.7* 8.3* 8.6* 8.8*  HCT 28.1* 29.0* 28.2* 28.6* 30.4*  MCV 97.2 96.7 96.9 95.0 96.5  PLT 206 226 232 243 274   CBG: Recent Labs  Lab 06/30/22 2006 06/30/22 2357 07/01/22 0404 07/01/22 0756 07/01/22 1619  GLUCAP 133* 109* 122* 121* 141*   Urine analysis:    Component Value Date/Time   COLORURINE YELLOW 06/18/2022 2027   APPEARANCEUR CLOUDY (A) 06/18/2022 2027   LABSPEC 1.010 06/18/2022 2027   PHURINE 5.0 06/18/2022 2027   GLUCOSEU NEGATIVE 06/18/2022 2027   HGBUR LARGE (A) 06/18/2022 2027   BILIRUBINUR NEGATIVE 06/18/2022 2027   [place] 06/18/2022  2027   PROTEINUR 30 (A) 06/18/2022 2027   UROBILINOGEN 0.2 03/04/2015 1835   NITRITE NEGATIVE 06/18/2022 2027   LEUKOCYTESUR LARGE (A) 06/18/2022 2027   Sepsis Labs:  No results found for this or any previous visit (from the past 240 hour(s)).    Scheduled Meds:  amiodarone  200 mg Oral Daily   apixaban  5 mg Oral BID   calcitRIOL  0.25 mcg Oral Daily   darbepoetin (ARANESP) injection - NON-DIALYSIS  300 mcg Subcutaneous Q Tue-1800   FLUoxetine  40 mg Oral Daily   fluticasone furoate-vilanterol  1 puff Inhalation Daily   furosemide  80 mg Intravenous Q12H   insulin aspart  0-9 Units Subcutaneous Q4H   metoprolol tartrate  12.5 mg Oral BID   mupirocin ointment   Nasal BID   nystatin  5 mL Oral QID   nystatin   Topical TID   mouth rinse  15 mL Mouth Rinse 4 times per day   pantoprazole  40  mg Oral BID   phosphorus  250 mg Oral TID   potassium chloride  40 mEq Oral BID   saccharomyces boulardii  250 mg Oral BID   senna-docusate  2 tablet Oral QHS   tamsulosin  0.4 mg Oral QPC supper   umeclidinium bromide  1 puff Inhalation Daily   Continuous Infusions:  sodium chloride 20 mL/hr at 07/01/22 1249     Procedures/Studies: DG CHEST PORT 1 VIEW  Result Date: 06/30/2022 CLINICAL DATA:  Shortness of breath.  Respiratory abnormalities. EXAM: PORTABLE CHEST 1 VIEW COMPARISON:  Chest x-ray June 28, 2022 FINDINGS: Stable cardiomegaly. The hila and mediastinum are unchanged. No pneumothorax. Haziness over the right base remains but is improved. Haziness over the left base is also identified. Mild interstitial prominence. IMPRESSION: Findings suggest cardiomegaly, pulmonary venous congestion, and small layering effusions, improved on the right in the interval. Electronically Signed   By: Dorise Bullion III M.D.   On: 06/30/2022 09:55   DG CHEST PORT 1 VIEW  Result Date: 06/28/2022 CLINICAL DATA:  Dyspnea. EXAM: PORTABLE CHEST 1 VIEW COMPARISON:  June 25, 2022. FINDINGS: Stable  cardiomegaly with mild central pulmonary vascular congestion. Bibasilar atelectasis or edema is noted with associated pleural effusions. Bony thorax is unremarkable. IMPRESSION: Stable cardiomegaly with mild central pulmonary vascular congestion and probable bilateral pulmonary edema and atelectasis. Probable bilateral pleural effusions. Electronically Signed   By: Marijo Conception M.D.   On: 06/28/2022 08:17   DG Chest Port 1 View  Result Date: 06/25/2022 CLINICAL DATA:  Hypoxia EXAM: PORTABLE CHEST 1 VIEW COMPARISON:  Chest x-ray 06/21/2022 FINDINGS: The heart is enlarged. Again seen is central pulmonary vascular congestion. There is a new small right pleural effusion. There are minimal patchy opacities at the lung bases. There is no evidence for pneumothorax or acute fracture. IMPRESSION: 1. Cardiomegaly with central pulmonary vascular congestion. 2. New small right pleural effusion. Electronically Signed   By: Ronney Asters M.D.   On: 06/25/2022 21:24   DG CHEST PORT 1 VIEW  Result Date: 06/21/2022 CLINICAL DATA:  Shortness of breath.  [id number] EXAM: PORTABLE CHEST 1 VIEW COMPARISON:  06/18/2022 FINDINGS: Cardiac enlargement with mild pulmonary vascular congestion. Linear scarring in the upper lungs. Diffuse interstitial pattern to the lungs is likely edema. Appearance is not significantly changed since prior study. No pleural effusions. No pneumothorax. Mediastinal contours appear intact. Calcification of the aorta. IMPRESSION: Cardiac enlargement with pulmonary vascular congestion and mild interstitial edema similar to prior study. Electronically Signed   By: Lucienne Capers M.D.   On: 06/21/2022 18:26   DG Chest 1 View  Result Date: 06/18/2022 CLINICAL DATA:  Altered mental status. EXAM: CHEST  1 VIEW COMPARISON:  Chest radiograph 04/12/2022, CT chest 04/12/2022 FINDINGS: The heart is enlarged.  The upper mediastinal contours are normal. Previously seen opacity in the left upper lobe has essentially  resolved, with mild residual patchy opacity which may reflect residual scarring. There is no new or worsening focal airspace disease. There are diffusely increased interstitial markings possibly reflecting mild pulmonary interstitial edema. There is no significant pleural effusion. There is no pneumothorax. Note that the smaller nodular opacity seen on the prior chest CT are not seen on the current study. There is no acute osseous abnormality. IMPRESSION: 1. Cardiomegaly with possible mild pulmonary interstitial edema. 2. Essentially resolved airspace opacity in the left upper lobe with probable residual scarring. 3. Additional smaller nodules seen on the prior chest CT are not seen on the current study.  Please refer to that study for follow-up recommendations. Electronically Signed   By: Valetta Mole M.D.   On: 06/18/2022 20:09   DG Tibia/Fibula Left  Result Date: 06/18/2022 CLINICAL DATA:  Left lower extremity erythema EXAM: LEFT TIBIA AND FIBULA - 2 VIEW COMPARISON:  03/22/2022 FINDINGS: Frontal and lateral views of the left tibia and fibula are obtained. There is a residual screw traversing the medial malleolus. K-wire within the medial malleolus and the plate and screw fixation across the distal fibula have been removed in the interim. New heterotopic ossification along the dorsal margin of the distal fibula. There are no acute displaced fractures. The bones are diffusely osteopenic. No evidence of bony destruction or periosteal reaction to suggest osteomyelitis. There is diffuse subcutaneous edema. IMPRESSION: 1. Diffuse subcutaneous edema consistent with cellulitis. 2. No radiographic evidence of osteomyelitis. 3. Diffuse osteopenia. 4. Revision of orthopedic hardware about the left ankle as above. Electronically Signed   By: Randa Ngo M.D.   On: 06/18/2022 20:06   CT Head Wo Contrast  Result Date: 06/18/2022 CLINICAL DATA:  Altered mental status EXAM: CT HEAD WITHOUT CONTRAST TECHNIQUE:  Contiguous axial images were obtained from the base of the skull through the vertex without intravenous contrast. RADIATION DOSE REDUCTION: This exam was performed according to the departmental dose-optimization program which includes automated exposure control, adjustment of the mA and/or kV according to patient size and/or use of iterative reconstruction technique. COMPARISON:  05/17/2019 FINDINGS: Brain: No mass, hemorrhage or extra-axial collection. There is periventricular hypoattenuation compatible with chronic microvascular disease. 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 Generalized volume loss. Vascular: Atherosclerotic calcification of the internal carotid and vertebral arteries at skull base. Skull: Normal. Negative for fracture or focal lesion. Sinuses/Orbits: No acute finding. Other: None. IMPRESSION: 1. No acute intracranial abnormality. 2. 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 Electronically Signed   By: Ulyses Jarred M.D.   On: 06/18/2022 19:50    Izabella Marcantel Denton Brick, MD  [facility]  If 7PM-7AM, please contact night-coverage [email or web address] Password [facility] 07/01/2022, 6:36 PM   LOS: 13 days

## 2022-07-02 DIAGNOSIS — G9341 Metabolic encephalopathy: Secondary | ICD-10-CM | POA: Diagnosis not present

## 2022-07-02 LAB — CBC
HCT: 32 % — ABNORMAL LOW (ref 39.0–52.0)
Hemoglobin: 9.2 g/dL — ABNORMAL LOW (ref 13.0–17.0)
MCH: 27.7 pg (ref 26.0–34.0)
MCHC: 28.8 g/dL — ABNORMAL LOW (ref 30.0–36.0)
MCV: 96.4 fL (ref 80.0–100.0)
Platelets: 302 10*3/uL (ref 150–400)
RBC: 3.32 MIL/uL — ABNORMAL LOW (ref 4.22–5.81)
RDW: 16.8 % — ABNORMAL HIGH (ref 11.5–15.5)
WBC: 9.9 10*3/uL (ref 4.0–10.5)
nRBC: 0.6 % — ABNORMAL HIGH (ref 0.0–0.2)

## 2022-07-02 LAB — MAGNESIUM: Magnesium: 1.7 mg/dL (ref 1.7–2.4)

## 2022-07-02 LAB — GLUCOSE, CAPILLARY
Glucose-Capillary: 121 mg/dL — ABNORMAL HIGH (ref 70–99)
Glucose-Capillary: 117 mg/dL — ABNORMAL HIGH (ref 70–99)
Glucose-Capillary: 124 mg/dL — ABNORMAL HIGH (ref 70–99)

## 2022-07-02 LAB — RENAL FUNCTION PANEL
Albumin: 2.1 g/dL — ABNORMAL LOW (ref 3.5–5.0)
Anion gap: 10 (ref 5–15)
BUN: 30 mg/dL — ABNORMAL HIGH (ref 8–23)
CO2: 26 mmol/L (ref 22–32)
Calcium: 7.9 mg/dL — ABNORMAL LOW (ref 8.9–10.3)
Chloride: 106 mmol/L (ref 98–111)
Creatinine, Ser: 3.4 mg/dL — ABNORMAL HIGH (ref 0.61–1.24)
GFR, Estimated: 18 mL/min — ABNORMAL LOW (ref >60)
Glucose, Bld: 121 mg/dL — ABNORMAL HIGH (ref 70–99)
Phosphorus: 3.3 mg/dL (ref 2.5–4.6)
Potassium: 4 mmol/L (ref 3.5–5.1)
Sodium: 142 mmol/L (ref 135–145)

## 2022-07-02 MED ORDER — ALPRAZOLAM 0.5 MG PO TABS: 0.5000 mg | ORAL_TABLET | Freq: Every day | ORAL | 0 refills | Status: AC

## 2022-07-02 MED ORDER — TAMSULOSIN HCL 0.4 MG PO CAPS: 0.4000 mg | ORAL_CAPSULE | Freq: Every day | ORAL | 2 refills | Status: AC

## 2022-07-02 MED ORDER — AMLODIPINE BESYLATE 10 MG PO TABS: 10.0000 mg | ORAL_TABLET | Freq: Every day | ORAL | 2 refills | Status: AC

## 2022-07-02 MED ORDER — SENNOSIDES-DOCUSATE SODIUM 8.6-50 MG PO TABS: 2.0000 | ORAL_TABLET | Freq: Every day | ORAL | 3 refills | Status: AC

## 2022-07-02 MED ORDER — APIXABAN 5 MG PO TABS: 5.0000 mg | ORAL_TABLET | Freq: Two times a day (BID) | ORAL | 2 refills | Status: AC

## 2022-07-02 MED ORDER — TIOTROPIUM BROMIDE MONOHYDRATE 18 MCG IN CAPS: 18.0000 ug | ORAL_CAPSULE | Freq: Every day | RESPIRATORY_TRACT | 2 refills | Status: AC

## 2022-07-02 MED ORDER — LANTUS 100 UNIT/ML ~~LOC~~ SOLN: 8.0000 [IU] | Freq: Every day | SUBCUTANEOUS | 11 refills | Status: AC

## 2022-07-02 MED ORDER — HUMALOG KWIKPEN 100 UNIT/ML ~~LOC~~ SOPN: 0.0000 [IU] | PEN_INJECTOR | Freq: Three times a day (TID) | SUBCUTANEOUS | 11 refills | Status: AC

## 2022-07-02 MED ORDER — POLYETHYLENE GLYCOL 3350 17 G PO PACK: 17.0000 g | PACK | Freq: Every day | ORAL | 2 refills | Status: AC

## 2022-07-02 MED ORDER — HYDROCODONE-ACETAMINOPHEN 7.5-325 MG PO TABS: 1.0000 | ORAL_TABLET | Freq: Four times a day (QID) | ORAL | 0 refills | Status: AC

## 2022-07-02 MED ORDER — FUROSEMIDE 10 MG/ML IJ SOLN: 40.0000 mg | Freq: Every day | INTRAMUSCULAR | Status: DC

## 2022-07-02 MED ORDER — AMIODARONE HCL 200 MG PO TABS: 200.0000 mg | ORAL_TABLET | Freq: Every day | ORAL | 3 refills | Status: AC

## 2022-07-02 MED ORDER — TORSEMIDE 20 MG PO TABS: 40.0000 mg | ORAL_TABLET | Freq: Two times a day (BID) | ORAL | 3 refills | Status: AC

## 2022-07-02 MED ORDER — ALBUTEROL SULFATE (2.5 MG/3ML) 0.083% IN NEBU: 2.5000 mg | INHALATION_SOLUTION | Freq: Four times a day (QID) | RESPIRATORY_TRACT | 12 refills | Status: AC | PRN

## 2022-07-02 MED ORDER — MAGNESIUM SULFATE 2 GM/50ML IV SOLN
2.0000 g | Freq: Once | INTRAVENOUS | Status: AC
  Administered 2022-07-02: 2 g via INTRAVENOUS
  Filled 2022-07-02: qty 50

## 2022-07-02 MED ORDER — IPRATROPIUM-ALBUTEROL 0.5-2.5 (3) MG/3ML IN SOLN: 3.0000 mL | RESPIRATORY_TRACT | 1 refills | Status: AC | PRN

## 2022-07-02 NOTE — Progress Notes (Signed)
Ng Discharge Note  Admit Date:  06/18/2022 Discharge date: 07/02/2022   Eric Scott to be D/C'd Skilled nursing facility per MD order.  AVS completed. Patient/caregiver able to verbalize understanding.  Discharge Medication: Allergies as of 07/02/2022       Reactions   Baclofen    unknown   Blueberry Flavor Other (See Comments)   Unknown   Cucumber Extract Other (See Comments)   "Fells like I'm having a heart attack"   Flexeril [cyclobenzaprine Hcl] Other (See Comments)   "whole body  Tremors" (05/27/2012)   Kiwi Extract Other (See Comments)   "feels like I'm having a heart attack"        Medication List     STOP taking these medications    insulin aspart 100 UNIT/ML injection Commonly known as: novoLOG   oxyCODONE-acetaminophen 10-325 MG tablet Commonly known as: PERCOCET   senna 8.6 MG tablet Commonly known as: SENOKOT   sodium zirconium cyclosilicate 10 g Pack packet Commonly known as: LOKELMA       TAKE these medications    acetaminophen 325 MG tablet Commonly known as: TYLENOL Take 2 tablets (650 mg total) by mouth every 6 (six) hours as needed for mild pain, fever or headache (or Fever >/= 101).   albuterol (2.5 MG/3ML) 0.083% nebulizer solution Commonly known as: PROVENTIL Take 3 mLs (2.5 mg total) by nebulization every 6 (six) hours as needed for wheezing or shortness of breath. What changed:  when to take this reasons to take this Another medication with the same name was removed. Continue taking this medication, and follow the directions you see here.   ALPRAZolam 0.5 MG tablet Commonly known as: XANAX Take 1 tablet (0.5 mg total) by mouth at bedtime.   amiodarone 200 MG tablet Commonly known as: PACERONE Take 1 tablet (200 mg total) by mouth daily. Start taking on: July 03, 2022   amLODipine 10 MG tablet Commonly known as: NORVASC Take 1 tablet (10 mg total) by mouth daily.   apixaban 5 MG Tabs tablet Commonly known as:  ELIQUIS Take 1 tablet (5 mg total) by mouth 2 (two) times daily.   ascorbic acid 500 MG tablet Commonly known as: VITAMIN C Take 500 mg by mouth 2 (two) times daily.   Breo Ellipta 100-25 MCG/ACT Aepb Generic drug: fluticasone furoate-vilanterol Inhale 1 puff into the lungs daily.   calcitRIOL 0.25 MCG capsule Commonly known as: ROCALTROL Take 0.25 mcg by mouth daily.   calcium carbonate 500 MG chewable tablet Commonly known as: TUMS - dosed in mg elemental calcium Chew 1 tablet by mouth daily.   cholecalciferol 1000 units tablet Commonly known as: VITAMIN D Take 1,000 Units by mouth daily.   coconut oil Oil Apply 1 Application topically every 6 (six) hours as needed (protection and prevention).   colchicine 0.6 MG tablet Take 0.5 tablets (0.3 mg total) by mouth daily.   cyanocobalamin 100 MCG tablet Take 1,000 mcg by mouth daily.   ferrous sulfate 325 (65 FE) MG tablet Take 325 mg by mouth 2 (two) times daily.   FLUoxetine 40 MG capsule Commonly known as: PROZAC Take 40 mg by mouth daily.   gabapentin 300 MG capsule Commonly known as: NEURONTIN Take 1 capsule (300 mg total) by mouth at bedtime.   guaiFENesin 200 MG tablet Take 200 mg by mouth 2 (two) times daily.   HumaLOG KwikPen 100 UNIT/ML KwikPen Generic drug: insulin lispro Inject 0-10 Units into the skin 3 (three) times daily before meals.  Per sliding scale insulin aspart (novoLOG) injection 0-10 Units 0-10 Units Subcutaneous, 3 times daily with meals CBG < 70: Implement Hypoglycemia Standing Orders and refer to Hypoglycemia Standing Orders sidebar report  CBG 70 - 120: 0 unit CBG 121 - 150: 0 unit  CBG 151 - 200: 1 unit CBG 201 - 250: 2 units CBG 251 - 300: 4 units CBG 301 - 350: 6 units  CBG 351 - 400: 8 units  CBG > 400: 10 units What changed:  how much to take additional instructions   HYDROcodone-acetaminophen 7.5-325 MG tablet Commonly known as: NORCO Take 1 tablet by mouth 4 (four) times daily.    ipratropium-albuterol 0.5-2.5 (3) MG/3ML Soln Commonly known as: DUONEB Take 3 mLs by nebulization every 4 (four) hours as needed (shortness of breath / wheezing).   Lantus 100 UNIT/ML injection Generic drug: insulin glargine Inject 0.08 mLs (8 Units total) into the skin at bedtime. What changed: how much to take   magnesium oxide 400 MG tablet Commonly known as: MAG-OX Take 400 mg by mouth daily.   metoprolol tartrate 25 MG tablet Commonly known as: LOPRESSOR Take 1 tablet (25 mg total) by mouth 2 (two) times daily.   Nutritional Supplement Liqd Take 120 mLs by mouth daily. "House supplement"   pantoprazole 40 MG tablet Commonly known as: PROTONIX Take 1 tablet (40 mg total) by mouth daily before breakfast.   polyethylene glycol 17 g packet Commonly known as: MIRALAX / GLYCOLAX Take 17 g by mouth daily. For constipation   Pro-Stat AWC Liqd Take 30 mLs by mouth daily.   senna-docusate 8.6-50 MG tablet Commonly known as: Senokot-S Take 2 tablets by mouth at bedtime. What changed:  how much to take when to take this reasons to take this   tamsulosin 0.4 MG Caps capsule Commonly known as: FLOMAX Take 1 capsule (0.4 mg total) by mouth daily after supper. What changed: when to take this   tiotropium 18 MCG inhalation capsule Commonly known as: Spiriva HandiHaler Place 1 capsule (18 mcg total) into inhaler and inhale daily.   torsemide 20 MG tablet Commonly known as: Demadex Take 2 tablets (40 mg total) by mouth 2 (two) times daily. What changed:  how much to take when to take this               Discharge Care Instructions  (From admission, onward)           Start     Ordered   07/02/22 0000  Discharge wound care:       Comments: Apply Aquacel Kellie Simmering # [phone]) to left outer leg wound Q day.  Cover other open areas on leg with Xeroform gauze, then cover everything with ABD pads and kerlex   07/02/22 1059            Discharge  Assessment: Vitals:   07/02/22 0606 07/02/22 0722  BP: (!) 159/80   Pulse: (!) 101   Resp: 20   Temp: 98.4 F (36.9 C)   SpO2: 98% 98%   Skin clean, dry and intact without evidence of skin break down, no evidence of skin tears noted. IV catheter discontinued intact. Site without signs and symptoms of complications - no redness or edema noted at insertion site, patient denies c/o pain - only slight tenderness at site.  Dressing with slight pressure applied.  D/c Instructions-Education: Discharge instructions given to patient/family with verbalized understanding. D/c education completed with patient/family including follow up instructions, medication list, d/c activities limitations  if indicated, with other d/c instructions as indicated by MD - patient able to verbalize understanding, all questions fully answered. Patient instructed to return to ED, call 911, or call MD for any changes in condition.  Patient escorted via stretcher with EMS to SNF facility.  Tsosie Billing, LPN 7/56/4332 95:18 PM

## 2022-07-02 NOTE — Discharge Summary (Signed)
Eric Scott, is a 72 y.o. male  DOB 1951/03/06  MRN [id number].  Admission date:  06/18/2022  Admitting Physician  Eric Roys, MD  Discharge Date:  07/02/2022   Primary MD  Eric Renshaw, MD  Recommendations for primary care physician for things to follow:   1)Per sliding scale insulin aspart (novoLOG) injection 0-10 Units 0-10 Units Subcutaneous, 3 times daily with meals CBG < 70: Implement Hypoglycemia Standing Orders and refer to Hypoglycemia Standing Orders sidebar report  CBG 70 - 120: 0 unit CBG 121 - 150: 0 unit  CBG 151 - 200: 1 unit CBG 201 - 250: 2 units CBG 251 - 300: 4 units CBG 301 - 350: 6 units  CBG 351 - 400: 8 units  CBG > 400: 10 units  2)Avoid ibuprofen/Advil/Aleve/Motrin/Goody Powders/Naproxen/BC powders/Meloxicam/Diclofenac/Indomethacin and other Nonsteroidal anti-inflammatory medications as these will make you more likely to bleed and can cause stomach ulcers, can also cause Kidney problems.   3)you need oxygen at home at 3.5 L via nasal cannula continuously while awake and while asleep--- smoking or having open fires around oxygen can cause fire, significant injury and death  4)Repeat  CBC and BMP every Thursday for 3 weeks starting Thursday 07/05/22  5) Daily wound care :-apply Aquacel Kellie Simmering # [phone]) to left outer leg wound Q day.  Cover other open areas on leg with Xeroform gauze, then cover everything with ABD pads and kerlix  Admission Diagnosis  Somnolence [R40.0] Acute kidney injury superimposed on chronic kidney disease ([place]) [N17.9, Z30.0] Acute metabolic encephalopathy [T62.26]   Discharge Diagnosis  Somnolence [R40.0] Acute kidney injury superimposed on chronic kidney disease ([place]) [N17.9, J33.5] Acute metabolic encephalopathy [K56.25]    Principal Problem:   Acute metabolic encephalopathy Active Problems:   AKI (acute kidney injury) ([place])   Acute on  chronic anemia   Stage 4 chronic kidney disease ([place])   UTI (urinary tract infection)   Type 2 diabetes mellitus with stage 4 chronic kidney disease, with Moraes-term current use of insulin ([name])   HTN (hypertension)   Chronic venous insufficiency   Atrial flutter, paroxysmal ([name])   Chronic heart failure with preserved ejection fraction (HFpEF) ([place])   COPD  GOLD ?  / AB    Melena      Past Medical History:  Diagnosis Date   Anemia    Anxiety    Chronic pain    legs, back; MRI 05/2012 with mild thoracic degenerative changes no spinal stenosis    CKD (chronic kidney disease) 05/12/2019   Stage IV   COPD (chronic obstructive pulmonary disease) ([place])    COVID-19    Depression    Diabetic peripheral neuropathy ([name])    "chronic" (63/89/3734)   Diastolic CHF ([name])    Duodenal ulcer hemorrhage    DVT (deep venous thrombosis) ([name])    right upper arm   GERD (gastroesophageal reflux disease)    Hypercholesteremia    Hypertension    Iron deficiency anemia    Osteomyelitis of ankle ([place])  Peripheral edema    PVD (peripheral vascular disease) ([place])    Spinal stenosis    mild lumbar (MRI 05/2012)-L2-L3 to L4-L5 , mild lumbar foraminal stenosis    Stroke ([place]) 05/2012   Subacute, lacunar infarcts within the left basal ganglia and posterior limp of the left internal capsule/thalamus; "RUE; both feet weak" (05/27/2012)   Tremor    Type II diabetes mellitus ([place])     Past Surgical History:  Procedure Laterality Date   ANKLE SURGERY     APPLICATION OF WOUND VAC Left 03/28/2022   Procedure: APPLICATION OF WOUND VAC;  Surgeon: [name], MD;  Location: [facility];  Service: Orthopedics;  Laterality: Left;   ESOPHAGOGASTRODUODENOSCOPY (EGD) WITH PROPOFOL N/A 12/21/2016   [place]: Ulcerative reflux esophagitis, erosive gastropathy, extensive duodenal ulceration likely site of bleeding.  Pathology with mild gastritis, no H. pylori   ESOPHAGOGASTRODUODENOSCOPY (EGD) WITH PROPOFOL N/A  04/02/2017   Fields: ESOPAHGITIS/GASTRIC AND DUODENAL ULCERS HEALED. mild duodenitis   ESOPHAGOGASTRODUODENOSCOPY (EGD) WITH PROPOFOL N/A 06/21/2022   Procedure: ESOPHAGOGASTRODUODENOSCOPY (EGD) WITH PROPOFOL;  Surgeon: [name], DO;  [facility];  Service: Endoscopy;  Laterality: N/A;   HARDWARE REMOVAL Left 03/28/2022   Procedure: LEFT ANKLE REMOVAL DEEP HARDWARE, REMOVE BONE AND APPLY TISSUE GRAFT;  Surgeon: [name], MD;  Location: [facility];  Service: Orthopedics;  Laterality: Left;   HERNIA REPAIR  01/05/2004   "belly button" (05/27/2012)   IR FLUORO GUIDE CV LINE RIGHT  04/03/2022   IR REMOVAL TUN CV CATH W/O FL  05/10/2022   IR US GUIDE VASC ACCESS RIGHT  04/03/2022     HPI  from the history and physical done on the day of admission:   HPI: Eric Scott is a 72 y.o. 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 At the time of my evaluation, patient is somnolent, unable to answer questions, moving extremities. Patient was brought to the ED via EMS from [facility] with reports of altered mental status.  Per triage notes and ED provider who talked to staff at facility, at about 5 PM staff noticed that patient was confused.  Reported his blood sugars ranging in the 400s subsequently dropped to 68>> 130.  It is unknown if insulin was given when his blood sugars were in the 400s, a different staff had taken over patient's care.  Glucagon was given.  He has also had chronic left lower extremity pain which is unchanged. He also got his Lantus today.   ED Course: Temperature down to 95.7.  Heart rate 59-62.  Respirate rate 18-22.  Blood pressure systolic 295-284.  O2 sats 93 to 99% on 4 L. ABG shows pH of 7.36, pCO2 47. COVID influenza RSV negative. Ammonia unremarkable less than 10. Lactic acid .7. CT negative for acute abnormality. UA  Suggestive of UTI. Left tibia-fibula x-ray-diffuse subcutaneous edema consistent with cellulitis Patient had black stools in the ED.  Protonix 40 mg given. 1 L Bolus given.  IV Ceftriaxone 1 g given. 0.4 mg IV Narcan given, with some initial improvement in mental status, subsequent 0.8 mg Narcan given without significant response. Hospitalist admit for altered mental status, likely UTI.   Review of Systems: Unable to assess due to altered mental status.    Hospital Course:     Assessment/Plan: 1)Acute on Chronic Metabolic Encephalopathy -Multifactorial including hypoglycemia,  AKI, polypharmacy in the setting of impaired  renal function -CT brain negative -Continue Gabapentin for now. --Mentation improved  back to baseline     2)Acute on chronic respiratory failure with volume overload/CHF exacerbation with pleural effusions in the setting of underlying COPD and chronic hypoxia and hypercarbia ---chest x-ray from 06/25/2022 and repeat chest x-ray from 06/28/2022 consistent CHF with new small bilateral pleural effusion -Nephrology consult appreciated -At baseline uses 3 L of oxygen via nasal cannula -As of 06/29/2022 oxygen requirement improving weaned down from 6 L by nasal cannula to 4 L 06/28/22-changed IV Lasix to 80 mg twice daily to try to achieve better diuresis --BNP elevated to 730 on 06/29/2022 -Treated with IV Lasix -Repeat CXR on 06/30/22 shows improving CHF  Hypoxia has improved now on 3 L/min  -Repeat BMP every Thursday x next weeks -Discharge on oral torsemide    3)Acute on chronic Renal Failure--CKD stage IV -Baseline creatinine 3.3-3.7 -Presented with serum creatinine 5.02---- -initially received IV fluids, was then treated with IV Lasix due to volume overload --renal function actually improved with diuresis now renal function appears to be back to baseline -Nephrology consult appreciated -Initially received IV fluids, -   4)Melena/FOBT positive -GI consultation  appreciated. -EGD on 06/21/2022 with gastritis and duodenitis but no significant bleeding noted -GI service recommends holding off on colonoscopy given tenuous cardio-respiratory status -Per GI team okay to restart Eliquis and watch H&H and watch for bleeding -Patient has been on Eliquis uninterrupted since 06/29/2022 --Hgb remains stable at this time while on Eliquis -repeat CBC weekly/every Thursday for the next 3 weeks -Consider outpatient colonoscopy--especially if Hgb drops    5)Paroxysmal atrial flutter/Afib -In and out of A-fib/flutter -Continue telemetry monitoring -Continue amiodarone  -Rate appears to be stable and well-controlled currently -Continue metoprolol 25 mg twice daily -Continue Eliquis as above #4   6)Polymicrobial left ankle osteomyelitis- -please see with pictures in epic -s/p debridement/hardware removal by Dr. Sharol Given on 03/29/23  -finished 6 weeks abx on 05/09/22 -finished linezolid 04/28/22---05/09/22 -Continue wound care service recommendation--:- Daily wound care :-apply Aquacel Kellie Simmering # [phone]) to left outer leg wound Q day.  Cover other open areas on leg with Xeroform gauze, then cover everything with ABD pads and kerlix   7)Social/Ethics-----Son, Teaching laboratory technician, is legal healthcare power of attorney.   [name] is Medical sales representative, although she was raised by Mr. Heffner.  Ruvim Risko is Mr. Mckinnon brother's wife, not his sister. -Patient is a DNR/DNI   Controlled DM2  -03/28/22 A1C--5.8 -Continue sliding scale insulin. Use Novolog/Humalog Sliding scale insulin with Accu-Cheks/Fingersticks as ordered    Gout -Not in flare -Continue colchicine   Chronic peripheral neuropathy/chronic back pain -Continue as needed judicious use of analgesics.   -Continue to hold gabapentin temporarily   Mood disorder -Overall stable-continue Prozac -Continue holding alprazolam temporarily, encouraged to minimize extra sedative and continue constant  reorientation.   1.6 cm left thyroid nodule -Incidental finding on CT chest -Further work-up deferred to outpatient setting.   7 mm left lower lobe nodule -Incidental finding on CT chest. -Repeat CT chest 6-12 months.   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   Morbid Obesity: -Body mass index is 35.22 kg/m. -Low-calorie diet, portion control and increase physical activity discussed with patient.   Left leg wound -Continue to follow recommendation from wound care service -No signs of acute superimposed infection appreciated. -Please see photos in epic from 06/27/2022   Generalized Weakness and Deconditioning--physical therapy recommends SNF rehab, plan  to [facility]   Suspected UTI --Urine culture without growth patient was previously empirically treated with 5 days of Rocephin  Social/ethics-----Son, [employer], is legal healthcare power of attorney.   [name] is Medical sales representative, although she was raised by Mr. Brannan.  Eathon Valade is Mr. Pate brother's wife, not his sister. -Palliative care consult appreciated patient is a DNR   Family Communication:  no Family at bedside/ ---Son, Teaching laboratory technician, is legal healthcare power of attorney.   [name] is Medical sales representative, although she was raised by Mr. Golab.  Klever Twyford is Mr. Speranza brother's wife, not his sister.   Consultants: Nephrology and palliative care  Discharge Condition: stable  Follow UP   Contact information for after-discharge care     Destination     [facility] .   Service: Skilled Nursing Contact information: [address] [place] [phone]                    Diet and Activity recommendation:  As advised  Discharge Instructions    Discharge Instructions     Call MD for:  difficulty breathing,  headache or visual disturbances   Complete by: As directed    Call MD for:  persistant dizziness or light-headedness   Complete by: As directed    Call MD for:  persistant nausea and vomiting   Complete by: As directed    Call MD for:  temperature >100.4   Complete by: As directed    Diet - low sodium heart healthy   Complete by: As directed    Discharge instructions   Complete by: As directed    1)Per sliding scale insulin aspart (novoLOG) injection 0-10 Units 0-10 Units Subcutaneous, 3 times daily with meals CBG < 70: Implement Hypoglycemia Standing Orders and refer to Hypoglycemia Standing Orders sidebar report  CBG 70 - 120: 0 unit CBG 121 - 150: 0 unit  CBG 151 - 200: 1 unit CBG 201 - 250: 2 units CBG 251 - 300: 4 units CBG 301 - 350: 6 units  CBG 351 - 400: 8 units  CBG > 400: 10 units  2)Avoid ibuprofen/Advil/Aleve/Motrin/Goody Powders/Naproxen/BC powders/Meloxicam/Diclofenac/Indomethacin and other Nonsteroidal anti-inflammatory medications as these will make you more likely to bleed and can cause stomach ulcers, can also cause Kidney problems.   3)you need oxygen at home at 3.5 L via nasal cannula continuously while awake and while asleep--- smoking or having open fires around oxygen can cause fire, significant injury and death  4)Repeat  CBC and BMP every Thursday for 3 weeks starting Thursday 07/05/22  5) Daily wound care :-apply Aquacel Kellie Simmering # [phone]) to left outer leg wound Q day.  Cover other open areas on leg with Xeroform gauze, then cover everything with ABD pads and kerlix   Discharge wound care:   Complete by: As directed    Apply Aquacel Kellie Simmering # [phone]) to left outer leg wound Q day.  Cover other open areas on leg with Xeroform gauze, then cover everything with ABD pads and kerlex   Increase activity slowly   Complete by: As directed          Discharge Medications     Allergies as of 07/02/2022       Reactions   Baclofen    unknown   Blueberry Flavor  Other (See Comments)   Unknown   Cucumber Extract Other (See Comments)   "  Fells like I'm having a heart attack"   Flexeril [cyclobenzaprine Hcl] Other (See Comments)   "whole body  Tremors" (05/27/2012)   Kiwi Extract Other (See Comments)   "feels like I'm having a heart attack"        Medication List     STOP taking these medications    insulin aspart 100 UNIT/ML injection Commonly known as: novoLOG   oxyCODONE-acetaminophen 10-325 MG tablet Commonly known as: PERCOCET   senna 8.6 MG tablet Commonly known as: SENOKOT   sodium zirconium cyclosilicate 10 g Pack packet Commonly known as: LOKELMA       TAKE these medications    acetaminophen 325 MG tablet Commonly known as: TYLENOL Take 2 tablets (650 mg total) by mouth every 6 (six) hours as needed for mild pain, fever or headache (or Fever >/= 101).   albuterol (2.5 MG/3ML) 0.083% nebulizer solution Commonly known as: PROVENTIL Take 3 mLs (2.5 mg total) by nebulization every 6 (six) hours as needed for wheezing or shortness of breath. What changed:  when to take this reasons to take this Another medication with the same name was removed. Continue taking this medication, and follow the directions you see here.   ALPRAZolam 0.5 MG tablet Commonly known as: XANAX Take 1 tablet (0.5 mg total) by mouth at bedtime.   amiodarone 200 MG tablet Commonly known as: PACERONE Take 1 tablet (200 mg total) by mouth daily. Start taking on: July 03, 2022   amLODipine 10 MG tablet Commonly known as: NORVASC Take 1 tablet (10 mg total) by mouth daily.   apixaban 5 MG Tabs tablet Commonly known as: ELIQUIS Take 1 tablet (5 mg total) by mouth 2 (two) times daily.   ascorbic acid 500 MG tablet Commonly known as: VITAMIN C Take 500 mg by mouth 2 (two) times daily.   Breo Ellipta 100-25 MCG/ACT Aepb Generic drug: fluticasone furoate-vilanterol Inhale 1 puff into the lungs daily.   calcitRIOL 0.25 MCG capsule Commonly  known as: ROCALTROL Take 0.25 mcg by mouth daily.   calcium carbonate 500 MG chewable tablet Commonly known as: TUMS - dosed in mg elemental calcium Chew 1 tablet by mouth daily.   cholecalciferol 1000 units tablet Commonly known as: VITAMIN D Take 1,000 Units by mouth daily.   coconut oil Oil Apply 1 Application topically every 6 (six) hours as needed (protection and prevention).   colchicine 0.6 MG tablet Take 0.5 tablets (0.3 mg total) by mouth daily.   cyanocobalamin 100 MCG tablet Take 1,000 mcg by mouth daily.   ferrous sulfate 325 (65 FE) MG tablet Take 325 mg by mouth 2 (two) times daily.   FLUoxetine 40 MG capsule Commonly known as: PROZAC Take 40 mg by mouth daily.   gabapentin 300 MG capsule Commonly known as: NEURONTIN Take 1 capsule (300 mg total) by mouth at bedtime.   guaiFENesin 200 MG tablet Take 200 mg by mouth 2 (two) times daily.   HumaLOG KwikPen 100 UNIT/ML KwikPen Generic drug: insulin lispro Inject 0-10 Units into the skin 3 (three) times daily before meals. Per sliding scale insulin aspart (novoLOG) injection 0-10 Units 0-10 Units Subcutaneous, 3 times daily with meals CBG < 70: Implement Hypoglycemia Standing Orders and refer to Hypoglycemia Standing Orders sidebar report  CBG 70 - 120: 0 unit CBG 121 - 150: 0 unit  CBG 151 - 200: 1 unit CBG 201 - 250: 2 units CBG 251 - 300: 4 units CBG 301 - 350: 6 units  CBG 351 -  400: 8 units  CBG > 400: 10 units What changed:  how much to take additional instructions   HYDROcodone-acetaminophen 7.5-325 MG tablet Commonly known as: NORCO Take 1 tablet by mouth 4 (four) times daily.   ipratropium-albuterol 0.5-2.5 (3) MG/3ML Soln Commonly known as: DUONEB Take 3 mLs by nebulization every 4 (four) hours as needed (shortness of breath / wheezing).   Lantus 100 UNIT/ML injection Generic drug: insulin glargine Inject 0.08 mLs (8 Units total) into the skin at bedtime. What changed: how much to take    magnesium oxide 400 MG tablet Commonly known as: MAG-OX Take 400 mg by mouth daily.   metoprolol tartrate 25 MG tablet Commonly known as: LOPRESSOR Take 1 tablet (25 mg total) by mouth 2 (two) times daily.   Nutritional Supplement Liqd Take 120 mLs by mouth daily. "House supplement"   pantoprazole 40 MG tablet Commonly known as: PROTONIX Take 1 tablet (40 mg total) by mouth daily before breakfast.   polyethylene glycol 17 g packet Commonly known as: MIRALAX / GLYCOLAX Take 17 g by mouth daily. For constipation   Pro-Stat AWC Liqd Take 30 mLs by mouth daily.   senna-docusate 8.6-50 MG tablet Commonly known as: Senokot-S Take 2 tablets by mouth at bedtime. What changed:  how much to take when to take this reasons to take this   tamsulosin 0.4 MG Caps capsule Commonly known as: FLOMAX Take 1 capsule (0.4 mg total) by mouth daily after supper. What changed: when to take this   tiotropium 18 MCG inhalation capsule Commonly known as: Spiriva HandiHaler Place 1 capsule (18 mcg total) into inhaler and inhale daily.   torsemide 20 MG tablet Commonly known as: Demadex Take 2 tablets (40 mg total) by mouth 2 (two) times daily. What changed:  how much to take when to take this               Discharge Care Instructions  (From admission, onward)           Start     Ordered   07/02/22 0000  Discharge wound care:       Comments: Apply Aquacel Kellie Simmering # [phone]) to left outer leg wound Q day.  Cover other open areas on leg with Xeroform gauze, then cover everything with ABD pads and kerlex   07/02/22 1059            Major procedures and Radiology Reports - PLEASE review detailed and final reports for all details, in brief -   DG CHEST PORT 1 VIEW  Result Date: 06/30/2022 CLINICAL DATA:  Shortness of breath.  Respiratory abnormalities. EXAM: PORTABLE CHEST 1 VIEW COMPARISON:  Chest x-ray June 28, 2022 FINDINGS: Stable cardiomegaly. The hila and  mediastinum are unchanged. No pneumothorax. Haziness over the right base remains but is improved. Haziness over the left base is also identified. Mild interstitial prominence. IMPRESSION: Findings suggest cardiomegaly, pulmonary venous congestion, and small layering effusions, improved on the right in the interval. Electronically Signed   By: Dorise Bullion III M.D.   On: 06/30/2022 09:55   DG CHEST PORT 1 VIEW  Result Date: 06/28/2022 CLINICAL DATA:  Dyspnea. EXAM: PORTABLE CHEST 1 VIEW COMPARISON:  June 25, 2022. FINDINGS: Stable cardiomegaly with mild central pulmonary vascular congestion. Bibasilar atelectasis or edema is noted with associated pleural effusions. Bony thorax is unremarkable. IMPRESSION: Stable cardiomegaly with mild central pulmonary vascular congestion and probable bilateral pulmonary edema and atelectasis. Probable bilateral pleural effusions. Electronically Signed   By: Jeneen Rinks  Murlean Caller M.D.   On: 06/28/2022 08:17   DG Chest Port 1 View  Result Date: 06/25/2022 CLINICAL DATA:  Hypoxia EXAM: PORTABLE CHEST 1 VIEW COMPARISON:  Chest x-ray 06/21/2022 FINDINGS: The heart is enlarged. Again seen is central pulmonary vascular congestion. There is a new small right pleural effusion. There are minimal patchy opacities at the lung bases. There is no evidence for pneumothorax or acute fracture. IMPRESSION: 1. Cardiomegaly with central pulmonary vascular congestion. 2. New small right pleural effusion. Electronically Signed   By: Ronney Asters M.D.   On: 06/25/2022 21:24   DG CHEST PORT 1 VIEW  Result Date: 06/21/2022 CLINICAL DATA:  Shortness of breath.  [id number] EXAM: PORTABLE CHEST 1 VIEW COMPARISON:  06/18/2022 FINDINGS: Cardiac enlargement with mild pulmonary vascular congestion. Linear scarring in the upper lungs. Diffuse interstitial pattern to the lungs is likely edema. Appearance is not significantly changed since prior study. No pleural effusions. No pneumothorax. Mediastinal  contours appear intact. Calcification of the aorta. IMPRESSION: Cardiac enlargement with pulmonary vascular congestion and mild interstitial edema similar to prior study. Electronically Signed   By: Lucienne Capers M.D.   On: 06/21/2022 18:26   DG Chest 1 View  Result Date: 06/18/2022 CLINICAL DATA:  Altered mental status. EXAM: CHEST  1 VIEW COMPARISON:  Chest radiograph 04/12/2022, CT chest 04/12/2022 FINDINGS: The heart is enlarged.  The upper mediastinal contours are normal. Previously seen opacity in the left upper lobe has essentially resolved, with mild residual patchy opacity which may reflect residual scarring. There is no new or worsening focal airspace disease. There are diffusely increased interstitial markings possibly reflecting mild pulmonary interstitial edema. There is no significant pleural effusion. There is no pneumothorax. Note that the smaller nodular opacity seen on the prior chest CT are not seen on the current study. There is no acute osseous abnormality. IMPRESSION: 1. Cardiomegaly with possible mild pulmonary interstitial edema. 2. Essentially resolved airspace opacity in the left upper lobe with probable residual scarring. 3. Additional smaller nodules seen on the prior chest CT are not seen on the current study. Please refer to that study for follow-up recommendations. Electronically Signed   By: Valetta Mole M.D.   On: 06/18/2022 20:09   DG Tibia/Fibula Left  Result Date: 06/18/2022 CLINICAL DATA:  Left lower extremity erythema EXAM: LEFT TIBIA AND FIBULA - 2 VIEW COMPARISON:  03/22/2022 FINDINGS: Frontal and lateral views of the left tibia and fibula are obtained. There is a residual screw traversing the medial malleolus. K-wire within the medial malleolus and the plate and screw fixation across the distal fibula have been removed in the interim. New heterotopic ossification along the dorsal margin of the distal fibula. There are no acute displaced fractures. The bones are  diffusely osteopenic. No evidence of bony destruction or periosteal reaction to suggest osteomyelitis. There is diffuse subcutaneous edema. IMPRESSION: 1. Diffuse subcutaneous edema consistent with cellulitis. 2. No radiographic evidence of osteomyelitis. 3. Diffuse osteopenia. 4. Revision of orthopedic hardware about the left ankle as above. Electronically Signed   By: Randa Ngo M.D.   On: 06/18/2022 20:06   CT Head Wo Contrast  Result Date: 06/18/2022 CLINICAL DATA:  Altered mental status EXAM: CT HEAD WITHOUT CONTRAST TECHNIQUE: Contiguous axial images were obtained from the base of the skull through the vertex without intravenous contrast. RADIATION DOSE REDUCTION: This exam was performed according to the departmental dose-optimization program which includes automated exposure control, adjustment of the mA and/or kV according to patient size and/or use  of iterative reconstruction technique. COMPARISON:  05/17/2019 FINDINGS: Brain: No mass, hemorrhage or extra-axial collection. There is periventricular hypoattenuation compatible with chronic microvascular disease. 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 Generalized volume loss. Vascular: Atherosclerotic calcification of the internal carotid and vertebral arteries at skull base. Skull: Normal. Negative for fracture or focal lesion. Sinuses/Orbits: No acute finding. Other: None. IMPRESSION: 1. No acute intracranial abnormality. 2. 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 Electronically Signed   By: Ulyses Jarred M.D.   On: 06/18/2022 19:50    Today   Subjective    Urban Gibson today has no new complaints          Patient has been seen and examined prior to discharge   Objective   Blood pressure (!) 159/80, pulse (!) 101, temperature 98.4 F (36.9 C), temperature source Oral, resp. rate 20, height 6' (1.829 m), weight 117.8 kg, SpO2 98 %.   Intake/Output Summary (Last 24 hours) at  07/02/2022 1102 Last data filed at 07/02/2022 0049 Gross per 24 hour  Intake 230.28 ml  Output 1000 ml  Net -769.72 ml    Exam Gen:- Awake Alert, chronically ill-appearing, in no acute distress  HEENT:- [place].AT, No sclera icterus Nose- [place] 3L/min Neck-Supple Neck,No JVD,.  Lungs-fair air movement, no wheezing  CV- S1, S2 normal, irregularly irregular  abd-  +ve B.Sounds, Abd Soft, No tenderness,    Extremity/Skin- good pedal pulses , healing lower extremity wounds, see photos in epic Psych-affect is appropriate, oriented x 3, occasional episodes of confusion and disorientation persist  neuro-Generalized weakness, no new focal deficits, no tremors -  Media Information  Document Information  Photos  Lt lateral foot  06/27/2022 17:06  Attached To:  Hospital Encounter on 06/18/22  Source Information  Roxan Hockey, MD  [facility]     Media Information  Document Information  Photos  Lt foot  06/27/2022 17:06  Attached To:  Hospital Encounter on 06/18/22  Source Information  Roxan Hockey, MD  [facility]     Media Information  Document Information  Photos  Lt Foot  06/27/2022 17:07  Attached To:  Hospital Encounter on 06/18/22  Source Information  Roxan Hockey, MD  [facility]     Data Review   CBC w Diff:  Lab Results  Component Value Date   WBC 9.9 07/02/2022   HGB 9.2 (L) 07/02/2022   HCT 32.0 (L) 07/02/2022   PLT 302 07/02/2022   LYMPHOPCT 5 06/18/2022   MONOPCT 6 06/18/2022   EOSPCT 4 06/18/2022   BASOPCT 0 06/18/2022    CMP:  Lab Results  Component Value Date   NA 142 07/02/2022   K 4.0 07/02/2022   CL 106 07/02/2022   CO2 26 07/02/2022   BUN 30 (H) 07/02/2022   CREATININE 3.40 (H) 07/02/2022   PROT 6.8 06/25/2022   ALBUMIN 2.1 (L) 07/02/2022   BILITOT 0.8 06/25/2022   ALKPHOS 64 06/25/2022   AST 15 06/25/2022   ALT 9 06/25/2022  .  Total Discharge time is about 33 minutes  Roxan Hockey M.D on  07/02/2022 at 11:02 AM  Go to [email or web address] -  for contact info  [facility] - Office  [phone]

## 2022-07-02 NOTE — Progress Notes (Signed)
Called report to receiving facility to Nurse Ebony Hail. Pt is resting in room awaiting transport. Will continue to monitor.

## 2022-07-02 NOTE — Discharge Instructions (Signed)
1)Per sliding scale insulin aspart (novoLOG) injection 0-10 Units 0-10 Units Subcutaneous, 3 times daily with meals CBG < 70: Implement Hypoglycemia Standing Orders and refer to Hypoglycemia Standing Orders sidebar report  CBG 70 - 120: 0 unit CBG 121 - 150: 0 unit  CBG 151 - 200: 1 unit CBG 201 - 250: 2 units CBG 251 - 300: 4 units CBG 301 - 350: 6 units  CBG 351 - 400: 8 units  CBG > 400: 10 units  2)Avoid ibuprofen/Advil/Aleve/Motrin/Goody Powders/Naproxen/BC powders/Meloxicam/Diclofenac/Indomethacin and other Nonsteroidal anti-inflammatory medications as these will make you more likely to bleed and can cause stomach ulcers, can also cause Kidney problems.   3)you need oxygen at home at 3.5 L via nasal cannula continuously while awake and while asleep--- smoking or having open fires around oxygen can cause fire, significant injury and death  4)Repeat  CBC and BMP every Thursday for 3 weeks starting Thursday 07/05/22  5) Daily wound care :-apply Aquacel Kellie Simmering # [phone]) to left outer leg wound Q day.  Cover other open areas on leg with Xeroform gauze, then cover everything with ABD pads and kerlix

## 2022-07-02 NOTE — TOC Transition Note (Signed)
Transition of Care [facility]) - CM/SW Discharge Note   Patient Details  Name: Eric Scott MRN: [id number] Date of Birth: 1950-06-05  Transition of Care [facility]) CM/SW Contact:  Ihor Gully, LCSW Phone Number: 07/02/2022, 11:32 AM   Clinical Narrative:    Discharge clinicals sent to facility. Message left for son. EMS to transport. Nurse to call report. TOC signing off.    Final next level of care: Skilled Nursing Facility Barriers to Discharge: No Barriers Identified   Patient Goals and CMS Choice      Discharge Placement                Patient chooses bed at: Other - please specify in the comment section below: [facility]) Patient to be transferred to facility by: [facility] Name of family member notified: Message left for son listed on chart. Patient and family notified of of transfer: 07/02/22  Discharge Plan and Services Additional resources added to the After Visit Summary for                                       Social Determinants of Health (SDOH) Interventions SDOH Screenings   Food Insecurity: No Food Insecurity (06/19/2022)  Housing: Low Risk  (06/19/2022)  Transportation Needs: No Transportation Needs (06/19/2022)  Utilities: Not At Risk (06/19/2022)  Depression (PHQ2-9): Low Risk  (04/24/2022)  Tobacco Use: High Risk (06/27/2022)     Readmission Risk Interventions     No data to display

## 2022-07-16 ENCOUNTER — Ambulatory Visit (INDEPENDENT_AMBULATORY_CARE_PROVIDER_SITE_OTHER): Payer: Medicare (Managed Care) | Admitting: Orthopedic Surgery

## 2022-07-16 DIAGNOSIS — T847XXA Infection and inflammatory reaction due to other internal orthopedic prosthetic devices, implants and grafts, initial encounter: Secondary | ICD-10-CM

## 2022-07-16 DIAGNOSIS — I872 Venous insufficiency (chronic) (peripheral): Secondary | ICD-10-CM | POA: Diagnosis not present

## 2022-07-16 DIAGNOSIS — L97311 Non-pressure chronic ulcer of right ankle limited to breakdown of skin: Secondary | ICD-10-CM

## 2022-07-16 DIAGNOSIS — M25572 Pain in left ankle and joints of left foot: Secondary | ICD-10-CM | POA: Diagnosis not present

## 2022-07-16 NOTE — Progress Notes (Incomplete)
CARDIOLOGY CONSULT NOTE       Patient ID: Eric Scott MRN: [id number] DOB/AGE: [birth year] 72 y.o.  Primary Physician: [name], MD Primary Cardiologist: Johnsie Cancel   HPI:  72 y.o. with history of HTN, HFpEF, COPD on chronic oxygen. CKD, DM, Anemia. And PAF. He has had non healing ulcers with prior surgery /hardware removal and fibula removal 10/25 on right Had self limited PAF converted spontaneously but then recurrence Hct as low as 7.7.  Started on amiodarone  DR Alveda Reasons started him on eliquis CHADVASC 5 He has been in NH for years and is essentially wheel chair bound He was in NSR when seen by PA 04/23/22   TTE 04/05/22 EF 60-65% moderate LVH AV sclerosis and trivial AR  D/c from hospital 07/02/22 back to [facility] Metabolic encephalopathy with elevated BS and worsening renal function Cr 3.4 at baseline on d/c Lasix for mild elevation in BNP and volume overload 700 range  ***  ROS All other systems reviewed and negative except as noted above  Past Medical History:  Diagnosis Date  . Anemia   . Anxiety   . Chronic pain    legs, back; MRI 05/2012 with mild thoracic degenerative changes no spinal stenosis   . 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 COPD (chronic obstructive pulmonary disease) ([place])   . COVID-19   . Depression   . Diabetic peripheral neuropathy ([place])    "chronic" (05/27/2012)  . Diastolic CHF ([place])   . Duodenal ulcer hemorrhage   . DVT (deep venous thrombosis) ([name])    right upper arm  . GERD (gastroesophageal reflux disease)   . Hypercholesteremia   . Hypertension   . Iron deficiency anemia   . Osteomyelitis of ankle ([place])   . Peripheral edema   . PVD (peripheral vascular disease) ([place])   . Spinal stenosis    mild lumbar (MRI 05/2012)-L2-L3 to L4-L5 , mild lumbar foraminal stenosis   . Stroke ([place]) 05/2012   Subacute, lacunar infarcts within the left basal ganglia and posterior limp of the left internal capsule/thalamus; "RUE;  both feet weak" (05/27/2012)  . Tremor   . Type II diabetes mellitus ([name])     Family History  Problem Relation Age of Onset  . Colon cancer Neg Hx     Social History   Socioeconomic History  . Marital status: Legally Separated    Spouse name: Not on file  . Number of children: Not on file  . Years of education: Not on file  . Highest education level: Not on file  Occupational History  . Not on file  Tobacco Use  . Smoking status: Every Day    Packs/day: 0.10    Years: 54.00    Total pack years: 5.40    Types: Cigarettes    Last attempt to quit: 12/12/2019    Years since quitting: 2.5  . Smokeless tobacco: Never  . Tobacco comments:    A pack a week  Vaping Use  . Vaping Use: Never used  Substance and Sexual Activity  . Alcohol use: No    Comment: 05/27/2012 "drank gallons and gallons 20 yr ago or so; last drink  at least 10 yr ago"  . Drug use: No  . Sexual activity: Not Currently  Other Topics Concern  . Not on file  Social History Narrative   Pt is living at [facility] [facility] for 6 years now    He is divorced. Has one kid.  Not working for about 7 years now. Used to do plumbing work.   Social Determinants of Health   Financial Resource Strain: Not on file  Food Insecurity: No Food Insecurity (06/19/2022)   Hunger Vital Sign   . Worried About Charity fundraiser in the Last Year: Never true   . Ran Out of Food in the Last Year: Never true  Transportation Needs: No Transportation Needs (06/19/2022)   PRAPARE - Transportation   . Lack of Transportation (Medical): No   . Lack of Transportation (Non-Medical): No  Physical Activity: Not on file  Stress: Not on file  Social Connections: Not on file  Intimate Partner Violence: Not At Risk (06/19/2022)   Humiliation, Afraid, Rape, and Kick questionnaire   . Fear of Current or Ex-Partner: No   . Emotionally Abused: No   . Physically Abused: No   . Sexually Abused: No    Past Surgical History:  Procedure  Laterality Date  . ANKLE SURGERY    . APPLICATION OF WOUND VAC Left 03/28/2022   Procedure: APPLICATION OF WOUND VAC;  Surgeon: [name], MD;  Location: [place];  Service: Orthopedics;  Laterality: Left;  . ESOPHAGOGASTRODUODENOSCOPY (EGD) WITH PROPOFOL N/A 12/21/2016   [place]: Ulcerative reflux esophagitis, erosive gastropathy, extensive duodenal ulceration likely site of bleeding.  Pathology with mild gastritis, no H. pylori  . ESOPHAGOGASTRODUODENOSCOPY (EGD) WITH PROPOFOL N/A 04/02/2017   Fields: ESOPAHGITIS/GASTRIC AND DUODENAL ULCERS HEALED. mild duodenitis  . ESOPHAGOGASTRODUODENOSCOPY (EGD) WITH PROPOFOL N/A 06/21/2022   Procedure: ESOPHAGOGASTRODUODENOSCOPY (EGD) WITH PROPOFOL;  Surgeon: [name], DO;  [facility];  Service: Endoscopy;  Laterality: N/A;  . HARDWARE REMOVAL Left 03/28/2022   Procedure: LEFT ANKLE REMOVAL DEEP HARDWARE, REMOVE BONE AND APPLY TISSUE GRAFT;  Surgeon: [name], MD;  Location: [place];  Service: Orthopedics;  Laterality: Left;  . HERNIA REPAIR  01/05/2004   "belly button" (05/27/2012)  . IR FLUORO GUIDE CV LINE RIGHT  04/03/2022  . IR REMOVAL TUN CV CATH W/O FL  05/10/2022  . IR US GUIDE VASC ACCESS RIGHT  04/03/2022      Current Outpatient Medications:  .  acetaminophen (TYLENOL) 325 MG tablet, Take 2 tablets (650 mg total) by mouth every 6 (six) hours as needed for mild pain, fever or headache (or Fever >/= 101)., Disp: 12 tablet, Rfl: 0 .  albuterol (PROVENTIL) (2.5 MG/3ML) 0.083% nebulizer solution, Take 3 mLs (2.5 mg total) by nebulization every 6 (six) hours as needed for wheezing or shortness of breath., Disp: 75 mL, Rfl: 12 .  ALPRAZolam (XANAX) 0.5 MG tablet, Take 1 tablet (0.5 mg total) by mouth at bedtime., Disp: 10 tablet, Rfl: 0 .  Amino Acids-Protein Hydrolys (PRO-STAT AWC) LIQD, Take 30 mLs by mouth daily., Disp: , Rfl:  .  amiodarone (PACERONE) 200 MG tablet, Take 1 tablet (200 mg total) by mouth daily., Disp: 30  tablet, Rfl: 3 .  amLODipine (NORVASC) 10 MG tablet, Take 1 tablet (10 mg total) by mouth daily., Disp: 30 tablet, Rfl: 2 .  apixaban (ELIQUIS) 5 MG TABS tablet, Take 1 tablet (5 mg total) by mouth 2 (two) times daily., Disp: 60 tablet, Rfl: 2 .  ascorbic acid (VITAMIN C) 500 MG tablet, Take 500 mg by mouth 2 (two) times daily., Disp: , Rfl:  .  BREO ELLIPTA 100-25 MCG/INH AEPB, Inhale 1 puff into the lungs daily. , Disp: , Rfl:  .  calcitRIOL (ROCALTROL) 0.25 MCG capsule, Take 0.25 mcg by  mouth daily., Disp: , Rfl:  .  calcium carbonate (TUMS - DOSED IN MG ELEMENTAL CALCIUM) 500 MG chewable tablet, Chew 1 tablet by mouth daily., Disp: , Rfl:  .  cholecalciferol (VITAMIN D) 1000 units tablet, Take 1,000 Units by mouth daily., Disp: , Rfl:  .  coconut oil OIL, Apply 1 Application topically every 6 (six) hours as needed (protection and prevention)., Disp: , Rfl:  .  colchicine 0.6 MG tablet, Take 0.5 tablets (0.3 mg total) by mouth daily., Disp: , Rfl:  .  cyanocobalamin 100 MCG tablet, Take 1,000 mcg by mouth daily., Disp: , Rfl:  .  ferrous sulfate 325 (65 FE) MG tablet, Take 325 mg by mouth 2 (two) times daily., Disp: , Rfl:  .  FLUoxetine (PROZAC) 40 MG capsule, Take 40 mg by mouth daily., Disp: , Rfl:  .  gabapentin (NEURONTIN) 300 MG capsule, Take 1 capsule (300 mg total) by mouth at bedtime., Disp: 30 capsule, Rfl: 1 .  guaiFENesin 200 MG tablet, Take 200 mg by mouth 2 (two) times daily., Disp: , Rfl:  .  HUMALOG KWIKPEN 100 UNIT/ML KwikPen, Inject 0-10 Units into the skin 3 (three) times daily before meals. Per sliding scale insulin aspart (novoLOG) injection 0-10 Units 0-10 Units Subcutaneous, 3 times daily with meals CBG < 70: Implement Hypoglycemia Standing Orders and refer to Hypoglycemia Standing Orders sidebar report  CBG 70 - 120: 0 unit CBG 121 - 150: 0 unit  CBG 151 - 200: 1 unit CBG 201 - 250: 2 units CBG 251 - 300: 4 units CBG 301 - 350: 6 units  CBG 351 - 400: 8 units  CBG > 400: 10  units, Disp: 15 mL, Rfl: 11 .  HYDROcodone-acetaminophen (NORCO) 7.5-325 MG tablet, Take 1 tablet by mouth 4 (four) times daily., Disp: 15 tablet, Rfl: 0 .  ipratropium-albuterol (DUONEB) 0.5-2.5 (3) MG/3ML SOLN, Take 3 mLs by nebulization every 4 (four) hours as needed (shortness of breath / wheezing)., Disp: 360 mL, Rfl: 1 .  LANTUS 100 UNIT/ML injection, Inject 0.08 mLs (8 Units total) into the skin at bedtime., Disp: 10 mL, Rfl: 11 .  magnesium oxide (MAG-OX) 400 MG tablet, Take 400 mg by mouth daily., Disp: , Rfl:  .  metoprolol tartrate (LOPRESSOR) 25 MG tablet, Take 1 tablet (25 mg total) by mouth 2 (two) times daily., Disp: , Rfl:  .  Nutritional Supplement LIQD, Take 120 mLs by mouth daily. "House supplement", Disp: , Rfl:  .  pantoprazole (PROTONIX) 40 MG tablet, Take 1 tablet (40 mg total) by mouth daily before breakfast., Disp: 30 tablet, Rfl: 0 .  polyethylene glycol (MIRALAX / GLYCOLAX) 17 g packet, Take 17 g by mouth daily. For constipation, Disp: 30 each, Rfl: 2 .  senna-docusate (SENOKOT-S) 8.6-50 MG tablet, Take 2 tablets by mouth at bedtime., Disp: 60 tablet, Rfl: 3 .  tamsulosin (FLOMAX) 0.4 MG CAPS capsule, Take 1 capsule (0.4 mg total) by mouth daily after supper., Disp: 30 capsule, Rfl: 2 .  tiotropium (SPIRIVA HANDIHALER) 18 MCG inhalation capsule, Place 1 capsule (18 mcg total) into inhaler and inhale daily., Disp: 30 capsule, Rfl: 2 .  torsemide (DEMADEX) 20 MG tablet, Take 2 tablets (40 mg total) by mouth 2 (two) times daily., Disp: 120 tablet, Rfl: 3    Physical Exam: There were no vitals taken for this visit.    Chronically ill male COPD with end exp wheezing SEM  LE edema with prior surgery LLE venous stasis changes  Labs:   Lab Results  Component Value Date   WBC 9.9 07/02/2022   HGB 9.2 (L) 07/02/2022   HCT 32.0 (L) 07/02/2022   MCV 96.4 07/02/2022   PLT 302 07/02/2022   No results for input(s): "NA", "K", "CL", "CO2", "BUN", "CREATININE",  "CALCIUM", "PROT", "BILITOT", "ALKPHOS", "ALT", "AST", "GLUCOSE" in the last 168 hours.  Invalid input(s): "LABALBU" Lab Results  Component Value Date   CKTOTAL 26 (L) 04/06/2022   CKMB 2 12/11/2016   TROPONINI 0.04 (HH) 12/10/2016    Lab Results  Component Value Date   CHOL 145 05/28/2012   Lab Results  Component Value Date   HDL 37 (L) 05/28/2012   Lab Results  Component Value Date   LDLCALC 84 05/28/2012   Lab Results  Component Value Date   TRIG 127 04/20/2019   TRIG 119 05/28/2012   Lab Results  Component Value Date   CHOLHDL 3.9 05/28/2012   No results found for: "LDLDIRECT"    Radiology: DG CHEST PORT 1 VIEW  Result Date: 06/30/2022 CLINICAL DATA:  Shortness of breath.  Respiratory abnormalities. EXAM: PORTABLE CHEST 1 VIEW COMPARISON:  Chest x-ray June 28, 2022 FINDINGS: Stable cardiomegaly. The hila and mediastinum are unchanged. No pneumothorax. Haziness over the right base remains but is improved. Haziness over the left base is also identified. Mild interstitial prominence. IMPRESSION: Findings suggest cardiomegaly, pulmonary venous congestion, and small layering effusions, improved on the right in the interval. Electronically Signed   By: Dorise Bullion III M.D.   On: 06/30/2022 09:55   DG CHEST PORT 1 VIEW  Result Date: 06/28/2022 CLINICAL DATA:  Dyspnea. EXAM: PORTABLE CHEST 1 VIEW COMPARISON:  June 25, 2022. FINDINGS: Stable cardiomegaly with mild central pulmonary vascular congestion. Bibasilar atelectasis or edema is noted with associated pleural effusions. Bony thorax is unremarkable. IMPRESSION: Stable cardiomegaly with mild central pulmonary vascular congestion and probable bilateral pulmonary edema and atelectasis. Probable bilateral pleural effusions. Electronically Signed   By: Marijo Conception M.D.   On: 06/28/2022 08:17   DG Chest Port 1 View  Result Date: 06/25/2022 CLINICAL DATA:  Hypoxia EXAM: PORTABLE CHEST 1 VIEW COMPARISON:  Chest x-ray  06/21/2022 FINDINGS: The heart is enlarged. Again seen is central pulmonary vascular congestion. There is a new small right pleural effusion. There are minimal patchy opacities at the lung bases. There is no evidence for pneumothorax or acute fracture. IMPRESSION: 1. Cardiomegaly with central pulmonary vascular congestion. 2. New small right pleural effusion. Electronically Signed   By: Ronney Asters M.D.   On: 06/25/2022 21:24   DG CHEST PORT 1 VIEW  Result Date: 06/21/2022 CLINICAL DATA:  Shortness of breath.  [id number] EXAM: PORTABLE CHEST 1 VIEW COMPARISON:  06/18/2022 FINDINGS: Cardiac enlargement with mild pulmonary vascular congestion. Linear scarring in the upper lungs. Diffuse interstitial pattern to the lungs is likely edema. Appearance is not significantly changed since prior study. No pleural effusions. No pneumothorax. Mediastinal contours appear intact. Calcification of the aorta. IMPRESSION: Cardiac enlargement with pulmonary vascular congestion and mild interstitial edema similar to prior study. Electronically Signed   By: Lucienne Capers M.D.   On: 06/21/2022 18:26   DG Chest 1 View  Result Date: 06/18/2022 CLINICAL DATA:  Altered mental status. EXAM: CHEST  1 VIEW COMPARISON:  Chest radiograph 04/12/2022, CT chest 04/12/2022 FINDINGS: The heart is enlarged.  The upper mediastinal contours are normal. Previously seen opacity in the left upper lobe has essentially resolved, with mild residual patchy opacity which may reflect  residual scarring. There is no new or worsening focal airspace disease. There are diffusely increased interstitial markings possibly reflecting mild pulmonary interstitial edema. There is no significant pleural effusion. There is no pneumothorax. Note that the smaller nodular opacity seen on the prior chest CT are not seen on the current study. There is no acute osseous abnormality. IMPRESSION: 1. Cardiomegaly with possible mild pulmonary interstitial edema. 2. Essentially  resolved airspace opacity in the left upper lobe with probable residual scarring. 3. Additional smaller nodules seen on the prior chest CT are not seen on the current study. Please refer to that study for follow-up recommendations. Electronically Signed   By: Valetta Mole M.D.   On: 06/18/2022 20:09   DG Tibia/Fibula Left  Result Date: 06/18/2022 CLINICAL DATA:  Left lower extremity erythema EXAM: LEFT TIBIA AND FIBULA - 2 VIEW COMPARISON:  03/22/2022 FINDINGS: Frontal and lateral views of the left tibia and fibula are obtained. There is a residual screw traversing the medial malleolus. K-wire within the medial malleolus and the plate and screw fixation across the distal fibula have been removed in the interim. New heterotopic ossification along the dorsal margin of the distal fibula. There are no acute displaced fractures. The bones are diffusely osteopenic. No evidence of bony destruction or periosteal reaction to suggest osteomyelitis. There is diffuse subcutaneous edema. IMPRESSION: 1. Diffuse subcutaneous edema consistent with cellulitis. 2. No radiographic evidence of osteomyelitis. 3. Diffuse osteopenia. 4. Revision of orthopedic hardware about the left ankle as above. Electronically Signed   By: Randa Ngo M.D.   On: 06/18/2022 20:06   CT Head Wo Contrast  Result Date: 06/18/2022 CLINICAL DATA:  Altered mental status EXAM: CT HEAD WITHOUT CONTRAST TECHNIQUE: Contiguous axial images were obtained from the base of the skull through the vertex without intravenous contrast. RADIATION DOSE REDUCTION: This exam was performed according to the departmental dose-optimization program which includes automated exposure control, adjustment of the mA and/or kV according to patient size and/or use of iterative reconstruction technique. COMPARISON:  05/17/2019 FINDINGS: Brain: No mass, hemorrhage or extra-axial collection. There is periventricular hypoattenuation compatible with chronic microvascular disease.  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 Generalized volume loss. Vascular: Atherosclerotic calcification of the internal carotid and vertebral arteries at skull base. Skull: Normal. Negative for fracture or focal lesion. Sinuses/Orbits: No acute finding. Other: None. IMPRESSION: 1. No acute intracranial abnormality. 2. 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 Electronically Signed   By: Ulyses Jarred M.D.   On: 06/18/2022 19:50    EKG: afib rate 87 nonspecific ST changes 07/02/22   ASSESSMENT AND PLAN:   PAF:  rate ok on eliquiis  His weight and age or ok for normal dosing GFR is only 18 continue metoprolol and low dose amiodarone COPD:  oxygen dependant duoneb and albuterol  HFpEF:  more related to CRF see below continue demedex dosing per renal CRF:  baseline Cr 3.5 f/u nephrology DM:  Discussed low carb diet.  Target hemoglobin A1c is 6.5 or less.  Continue current medications.  HTN:  controlled with addition of norvasc Anemia:  F/U Dr Abbey Chatters EGD 06/21/22 with gastritis/duodenitis Don't see that colon was done. Most recent Hct improved on iron 32  F/U with cardiology in a year  Signed: Jenkins Rouge 07/16/2022, 3:17 PM

## 2022-07-17 ENCOUNTER — Encounter: Payer: Self-pay | Admitting: Orthopedic Surgery

## 2022-07-17 NOTE — Progress Notes (Signed)
Office Visit Note   Patient: Eric Scott           Date of Birth: 03/24/1951           MRN: [id number] Visit Date: 07/16/2022              Requested by: [name], MD [address] [place],  [place] [address] PCP: [name], MD  Chief Complaint  Patient presents with   Left Ankle - Follow-up    03/28/2022 left ankle removal HWD with kerecis      HPI: Patient is a 72 year old gentleman who is status post hardware removal and Kerecis placement on October 25 left ankle.  Patient also has a chronic right ankle ulcer.  Patient has been undergoing compression wraps changing 2 times a week at skilled nursing.  Patient presents the legs are currently not wrapped.  Patient has been using Hydrofera Blue.  Assessment & Plan: Visit Diagnoses:  1. Hardware complicating wound infection, initial encounter ([place])   2. Non-pressure chronic ulcer of right ankle limited to breakdown of skin ([name])   3. Venous insufficiency (chronic) (peripheral)   4. Pain in left ankle and joints of left foot     Plan: Recommended daily dressing changes with compression.  Patient has increased maceration around the wound bed.  Follow-Up Instructions: Return in about 3 weeks (around 08/06/2022).   Ortho Exam  Patient is alert, oriented, no adenopathy, well-dressed, normal affect, normal respiratory effort. Examination patient has all 4 extremities that are cold to touch.  He is currently on continuous 3 L nasal cannula FiO2.  The lateral left ankle wound has good granulation tissue and good petechial bleeding it measures 10 x 1 cm.  Imaging: No results found. No images are attached to the encounter.  Labs: Lab Results  Component Value Date   HGBA1C 5.8 (H) 03/28/2022   HGBA1C 6.5 (H) 05/12/2020   HGBA1C 7.0 (H) 11/16/2019   ESRSEDRATE 49 (H) 03/31/2022   ESRSEDRATE 60 (H) 11/26/2016   CRP 10.8 (H) 03/31/2022   CRP 6.5 (H) 04/20/2019   CRP 3.2 (H) 11/26/2016   LABURIC 11.5 (H) 08/24/2009    REPTSTATUS 06/20/2022 FINAL 06/18/2022   GRAMSTAIN NO WBC SEEN NO ORGANISMS SEEN  03/28/2022   CULT MULTIPLE SPECIES PRESENT, SUGGEST RECOLLECTION (A) 06/18/2022   LABORGA ACINETOBACTER CALCOACETICUS/BAUMANNII COMPLEX 03/28/2022   LABORGA MORGANELLA MORGANII 03/28/2022   LABORGA ENTEROCOCCUS FAECALIS 03/28/2022   LABORGA METHICILLIN RESISTANT STAPHYLOCOCCUS AUREUS 03/28/2022     Lab Results  Component Value Date   ALBUMIN 2.1 (L) 07/02/2022   ALBUMIN 2.1 (L) 07/01/2022   ALBUMIN 2.1 (L) 06/30/2022    Lab Results  Component Value Date   MG 1.7 07/02/2022   MG 2.3 06/18/2022   MG 2.0 04/12/2022   No results found for: "VD25OH"  No results found for: "PREALBUMIN"    Latest Ref Rng & Units 07/02/2022    5:26 AM 06/30/2022    2:52 AM 06/29/2022    4:33 AM  CBC EXTENDED  WBC 4.0 - 10.5 K/uL 9.9  11.2  11.8   RBC 4.22 - 5.81 MIL/uL 3.32  3.15  3.01   Hemoglobin 13.0 - 17.0 g/dL 9.2  8.8  8.6   HCT 39.0 - 52.0 % 32.0  30.4  28.6   Platelets 150 - 400 K/uL 302  274  243      There is no height or weight on file to calculate BMI.  Orders:  No orders of the defined  types were placed in this encounter.  No orders of the defined types were placed in this encounter.    Procedures: No procedures performed  Clinical Data: No additional findings.  ROS:  All other systems negative, except as noted in the HPI. Review of Systems  Objective: Vital Signs: There were no vitals taken for this visit.  Specialty Comments:  No specialty comments available.  PMFS History: Patient Active Problem List   Diagnosis Date Noted   Melena [id number]   Acute metabolic encephalopathy [id number]   UTI (urinary tract infection) 06/18/2022   Status post peripherally inserted central catheter (PICC) central line placement 04/24/2022   PAF (paroxysmal atrial fibrillation) ([place]) 04/06/2022   Mood disorder ([place]) 04/02/2022   Stage 4 chronic kidney disease ([name])    Osteomyelitis of ankle  or foot, acute, left ([place]) 03/29/2022   Ulcer of left lower leg, with necrosis of bone ([place]) 03/28/2022   Abscess of left lower leg [id number]   Hardware complicating wound infection ([place]) 03/28/2022   Leg wound, left, sequela 03/28/2022   Cholelithiasis 05/12/2020   Elevated liver enzymes    RUQ pain    Bacteremia 05/11/2020   COPD  GOLD ?  / AB  01/26/2020   Acute on chronic respiratory failure with hypoxia ([place]) 12/18/2019   Respiratory failure with hypoxia ([place]) 11/17/2019   Acute on chronic anemia 11/16/2019   Cellulitis of both lower extremities 11/16/2019   Leukocytosis 05/22/2019   Chronic heart failure with preserved ejection fraction (HFpEF) ([place]) 05/22/2019   Acute respiratory failure ([place]) 05/17/2019   Obesity, Class III, BMI 40-49.9 (morbid obesity) ([place]) 05/17/2019   Atrial flutter, paroxysmal ([place]) 05/17/2019   COPD with acute exacerbation ([place]) 04/20/2019   GERD (gastroesophageal reflux disease) 08/08/2018   PUD (peptic ulcer disease)    Gastric erosions 02/13/2017   Duodenal ulcer 02/13/2017   Transaminitis    UGI bleed    Arm DVT (deep venous thromboembolism), acute, right ([place]) 12/18/2016   Uremia 12/10/2016   Acute renal failure ([name])    Volume depletion 11/23/2016   Hypotension    Liver enzyme elevation    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 ([place]) 11/15/2016   Arterial insufficiency of lower extremity ([place]) 11/15/2016   Pressure injury of skin 11/09/2016   Sepsis ([place]) 11/09/2016   Osteomyelitis ([place]) 11/09/2016   Wounds, multiple 11/08/2016   Chronic venous insufficiency 10/12/2016   Critical lower limb ischemia ([place]) 10/12/2016   Fatty liver 09/10/2016   History of colonic polyps 09/10/2016   Gallstone 09/10/2016   AKI (acute kidney injury) ([place]) 03/04/2015   Fall 03/04/2015   Generalized weakness 03/04/2015   Diabetic polyneuropathy associated with type 2 diabetes mellitus ([place])    Anxiety     PVD (peripheral vascular disease) ([place])    Pneumonia 07/20/2014   Anxiety state 04/18/2014   Acute on chronic diastolic CHF (congestive heart failure) ([place]) 04/07/2014   Solitary pulmonary nodule 04/07/2014   COPD with exacerbation ([place]) 04/02/2014   Hypoxia 04/02/2014   Cerebral infarction ([place]) 06/01/2012   Chronic pain (back, legs) 05/30/2012   Toe laceration, 4th toe 05/30/2012   C. difficile diarrhea 05/30/2012   Hypokalemia 05/30/2012   Acute lacunar stroke ([place]) 05/27/2012   Type 2 diabetes mellitus with stage 4 chronic kidney disease, with [place]-term current use of insulin ([place]) 05/27/2012   Smoker 05/27/2012   HTN (hypertension) 05/27/2012   Past Medical History:  Diagnosis Date   Anemia  Anxiety    Chronic pain    legs, back; MRI 05/2012 with mild thoracic degenerative changes no spinal stenosis    CKD (chronic kidney disease) 05/12/2019   Stage IV   COPD (chronic obstructive pulmonary disease) ([place])    COVID-19    Depression    Diabetic peripheral neuropathy ([place])    "chronic" (A999333)   Diastolic CHF ([name])    Duodenal ulcer hemorrhage    DVT (deep venous thrombosis) ([name])    right upper arm   GERD (gastroesophageal reflux disease)    Hypercholesteremia    Hypertension    Iron deficiency anemia    Osteomyelitis of ankle ([name])    Peripheral edema    PVD (peripheral vascular disease) ([place])    Spinal stenosis    mild lumbar (MRI 05/2012)-L2-L3 to L4-L5 , mild lumbar foraminal stenosis    Stroke ([place]) 05/2012   Subacute, lacunar infarcts within the left basal ganglia and posterior limp of the left internal capsule/thalamus; "RUE; both feet weak" (05/27/2012)   Tremor    Type II diabetes mellitus ([place])     Family History  Problem Relation Age of Onset   Colon cancer Neg Hx     Past Surgical History:  Procedure Laterality Date   ANKLE SURGERY     APPLICATION OF WOUND VAC Left 03/28/2022   Procedure: APPLICATION OF WOUND VAC;  Surgeon: [name], MD;  Location: [facility];  Service: Orthopedics;  Laterality: Left;   ESOPHAGOGASTRODUODENOSCOPY (EGD) WITH PROPOFOL N/A 12/21/2016   Rourk: Ulcerative reflux esophagitis, erosive gastropathy, extensive duodenal ulceration likely site of bleeding.  Pathology with mild gastritis, no H. pylori   ESOPHAGOGASTRODUODENOSCOPY (EGD) WITH PROPOFOL N/A 04/02/2017   Fields: ESOPAHGITIS/GASTRIC AND DUODENAL ULCERS HEALED. mild duodenitis   ESOPHAGOGASTRODUODENOSCOPY (EGD) WITH PROPOFOL N/A 06/21/2022   Procedure: ESOPHAGOGASTRODUODENOSCOPY (EGD) WITH PROPOFOL;  Surgeon: [name], DO;  [facility];  Service: Endoscopy;  Laterality: N/A;   HARDWARE REMOVAL Left 03/28/2022   Procedure: LEFT ANKLE REMOVAL DEEP HARDWARE, REMOVE BONE AND APPLY TISSUE GRAFT;  Surgeon: [name], MD;  Location: [facility];  Service: Orthopedics;  Laterality: Left;   HERNIA REPAIR  01/05/2004   "belly button" (05/27/2012)   IR FLUORO GUIDE CV LINE RIGHT  04/03/2022   IR REMOVAL TUN CV CATH W/O FL  05/10/2022   IR US GUIDE VASC ACCESS RIGHT  04/03/2022   Social History   Occupational History   Not on file  Tobacco Use   Smoking status: Every Day    Packs/day: 0.10    Years: 54.00    Total pack years: 5.40    Types: Cigarettes    Last attempt to quit: 12/12/2019    Years since quitting: 2.5   Smokeless tobacco: Never   Tobacco comments:    A pack a week  Vaping Use   Vaping Use: Never used  Substance and Sexual Activity   Alcohol use: No    Comment: 05/27/2012 "drank gallons and gallons 20 yr ago or so; last drink  at least 10 yr ago"   Drug use: No   Sexual activity: Not Currently

## 2022-07-22 NOTE — Progress Notes (Unsigned)
GI Office Note    Referring Provider: [name], MD Primary Care Physician:  [name], MD  Primary Gastroenterologist: Harvel Quale, MD, formerly Dr. Oneida Alar  Chief Complaint   No chief complaint on file.   History of Present Illness   Eric Scott is a 72 y.o. male presenting today at the request of [name], MD for hospital follow up.   Colonoscopy 10/29/12: -left colon diverticulosis -3 polyps in transverse colon and distal descending colon -prox transverse and distal descending colon revealed to be tubular adenoma  Last seen in the clinic in March 2020. Noted to have history of Cdiff and GI bleed in July 2018. At time of visit he was doing well. GERD well managed on PPI regimen. No changes to regimen.   Hospitalization December 2021 with concern for cholangitis and brief biliary obstruction secondary to choledocholithiasis.   Seen during hospitalization at [facility] in January 2024. Hospitalized for AMS and diminished oral intake and our service consulted for acute anemia and maroon colored stools. Underwent EGD as stated below. Patient was then prepped for colonoscopy however he had increase in oxygen requirements and recommended to be performed outpatient. Xarelto was restarted. Hgb on discharge 9.2.   EGD 06/21/22: -Gastritis -Duodenitis -advised to continue PPI BID    Today: Oxygen requirement:   Current Outpatient Medications  Medication Sig Dispense Refill   acetaminophen (TYLENOL) 325 MG tablet Take 2 tablets (650 mg total) by mouth every 6 (six) hours as needed for mild pain, fever or headache (or Fever >/= 101). 12 tablet 0   albuterol (PROVENTIL) (2.5 MG/3ML) 0.083% nebulizer solution Take 3 mLs (2.5 mg total) by nebulization every 6 (six) hours as needed for wheezing or shortness of breath. 75 mL 12   ALPRAZolam (XANAX) 0.5 MG tablet Take 1 tablet (0.5 mg total) by mouth at bedtime. 10 tablet 0   Amino Acids-Protein Hydrolys (PRO-STAT AWC)  LIQD Take 30 mLs by mouth daily.     amiodarone (PACERONE) 200 MG tablet Take 1 tablet (200 mg total) by mouth daily. 30 tablet 3   amLODipine (NORVASC) 10 MG tablet Take 1 tablet (10 mg total) by mouth daily. 30 tablet 2   apixaban (ELIQUIS) 5 MG TABS tablet Take 1 tablet (5 mg total) by mouth 2 (two) times daily. 60 tablet 2   ascorbic acid (VITAMIN C) 500 MG tablet Take 500 mg by mouth 2 (two) times daily.     BREO ELLIPTA 100-25 MCG/INH AEPB Inhale 1 puff into the lungs daily.      calcitRIOL (ROCALTROL) 0.25 MCG capsule Take 0.25 mcg by mouth daily.     calcium carbonate (TUMS - DOSED IN MG ELEMENTAL CALCIUM) 500 MG chewable tablet Chew 1 tablet by mouth daily.     cholecalciferol (VITAMIN D) 1000 units tablet Take 1,000 Units by mouth daily.     coconut oil OIL Apply 1 Application topically every 6 (six) hours as needed (protection and prevention).     colchicine 0.6 MG tablet Take 0.5 tablets (0.3 mg total) by mouth daily.     cyanocobalamin 100 MCG tablet Take 1,000 mcg by mouth daily.     ferrous sulfate 325 (65 FE) MG tablet Take 325 mg by mouth 2 (two) times daily.     FLUoxetine (PROZAC) 40 MG capsule Take 40 mg by mouth daily.     gabapentin (NEURONTIN) 300 MG capsule Take 1 capsule (300 mg total) by mouth at bedtime. 30 capsule 1   guaiFENesin  200 MG tablet Take 200 mg by mouth 2 (two) times daily.     HUMALOG KWIKPEN 100 UNIT/ML KwikPen Inject 0-10 Units into the skin 3 (three) times daily before meals. Per sliding scale insulin aspart (novoLOG) injection 0-10 Units 0-10 Units Subcutaneous, 3 times daily with meals CBG < 70: Implement Hypoglycemia Standing Orders and refer to Hypoglycemia Standing Orders sidebar report  CBG 70 - 120: 0 unit CBG 121 - 150: 0 unit  CBG 151 - 200: 1 unit CBG 201 - 250: 2 units CBG 251 - 300: 4 units CBG 301 - 350: 6 units  CBG 351 - 400: 8 units  CBG > 400: 10 units 15 mL 11   HYDROcodone-acetaminophen (NORCO) 7.5-325 MG tablet Take 1 tablet by mouth 4  (four) times daily. 15 tablet 0   ipratropium-albuterol (DUONEB) 0.5-2.5 (3) MG/3ML SOLN Take 3 mLs by nebulization every 4 (four) hours as needed (shortness of breath / wheezing). 360 mL 1   LANTUS 100 UNIT/ML injection Inject 0.08 mLs (8 Units total) into the skin at bedtime. 10 mL 11   magnesium oxide (MAG-OX) 400 MG tablet Take 400 mg by mouth daily.     metoprolol tartrate (LOPRESSOR) 25 MG tablet Take 1 tablet (25 mg total) by mouth 2 (two) times daily.     Nutritional Supplement LIQD Take 120 mLs by mouth daily. "House supplement"     pantoprazole (PROTONIX) 40 MG tablet Take 1 tablet (40 mg total) by mouth daily before breakfast. 30 tablet 0   polyethylene glycol (MIRALAX / GLYCOLAX) 17 g packet Take 17 g by mouth daily. For constipation 30 each 2   senna-docusate (SENOKOT-S) 8.6-50 MG tablet Take 2 tablets by mouth at bedtime. 60 tablet 3   tamsulosin (FLOMAX) 0.4 MG CAPS capsule Take 1 capsule (0.4 mg total) by mouth daily after supper. 30 capsule 2   tiotropium (SPIRIVA HANDIHALER) 18 MCG inhalation capsule Place 1 capsule (18 mcg total) into inhaler and inhale daily. 30 capsule 2   torsemide (DEMADEX) 20 MG tablet Take 2 tablets (40 mg total) by mouth 2 (two) times daily. 120 tablet 3   No current facility-administered medications for this visit.    Past Medical History:  Diagnosis Date   Anemia    Anxiety    Chronic pain    legs, back; MRI 05/2012 with mild thoracic degenerative changes no spinal stenosis    CKD (chronic kidney disease) 05/12/2019   Stage IV   COPD (chronic obstructive pulmonary disease) ([place])    COVID-19    Depression    Diabetic peripheral neuropathy ([name])    "chronic" (A999333)   Diastolic CHF ([name])    Duodenal ulcer hemorrhage    DVT (deep venous thrombosis) ([name])    right upper arm   GERD (gastroesophageal reflux disease)    Hypercholesteremia    Hypertension    Iron deficiency anemia    Osteomyelitis of ankle ([name])    Peripheral edema     PVD (peripheral vascular disease) ([place])    Spinal stenosis    mild lumbar (MRI 05/2012)-L2-L3 to L4-L5 , mild lumbar foraminal stenosis    Stroke ([place]) 05/2012   Subacute, lacunar infarcts within the left basal ganglia and posterior limp of the left internal capsule/thalamus; "RUE; both feet weak" (05/27/2012)   Tremor    Type II diabetes mellitus ([place])     Past Surgical History:  Procedure Laterality Date   ANKLE SURGERY     APPLICATION OF WOUND VAC Left 03/28/2022  Procedure: APPLICATION OF WOUND VAC;  Surgeon: [name], MD;  Location: [facility];  Service: Orthopedics;  Laterality: Left;   ESOPHAGOGASTRODUODENOSCOPY (EGD) WITH PROPOFOL N/A 12/21/2016   Rourk: Ulcerative reflux esophagitis, erosive gastropathy, extensive duodenal ulceration likely site of bleeding.  Pathology with mild gastritis, no H. pylori   ESOPHAGOGASTRODUODENOSCOPY (EGD) WITH PROPOFOL N/A 04/02/2017   Fields: ESOPAHGITIS/GASTRIC AND DUODENAL ULCERS HEALED. mild duodenitis   ESOPHAGOGASTRODUODENOSCOPY (EGD) WITH PROPOFOL N/A 06/21/2022   Procedure: ESOPHAGOGASTRODUODENOSCOPY (EGD) WITH PROPOFOL;  Surgeon: [name], DO;  [facility];  Service: Endoscopy;  Laterality: N/A;   HARDWARE REMOVAL Left 03/28/2022   Procedure: LEFT ANKLE REMOVAL DEEP HARDWARE, REMOVE BONE AND APPLY TISSUE GRAFT;  Surgeon: [name], MD;  Location: [facility];  Service: Orthopedics;  Laterality: Left;   HERNIA REPAIR  01/05/2004   "belly button" (05/27/2012)   IR FLUORO GUIDE CV LINE RIGHT  04/03/2022   IR REMOVAL TUN CV CATH W/O FL  05/10/2022   IR US GUIDE VASC ACCESS RIGHT  04/03/2022    Family History  Problem Relation Age of Onset   Colon cancer Neg Hx     Allergies as of 07/23/2022 - Review Complete 07/17/2022  Allergen Reaction Noted   Baclofen  03/27/2022   Blueberry flavor Other (See Comments) 05/29/2016   Cucumber extract Other (See Comments) 08/01/2012   Flexeril [cyclobenzaprine hcl] Other (See  Comments) 07/20/2010   Kiwi extract Other (See Comments) 08/01/2012    Social History   Socioeconomic History   Marital status: Legally Separated    Spouse name: Not on file   Number of children: Not on file   Years of education: Not on file   Highest education level: Not on file  Occupational History   Not on file  Tobacco Use   Smoking status: Every Day    Packs/day: 0.10    Years: 54.00    Total pack years: 5.40    Types: Cigarettes    Last attempt to quit: 12/12/2019    Years since quitting: 2.6   Smokeless tobacco: Never   Tobacco comments:    A pack a week  Vaping Use   Vaping Use: Never used  Substance and Sexual Activity   Alcohol use: No    Comment: 05/27/2012 "drank gallons and gallons 20 yr ago or so; last drink  at least 10 yr ago"   Drug use: No   Sexual activity: Not Currently  Other Topics Concern   Not on file  Social History Narrative   Pt is living at [facility] for 6 years now    He is divorced. Has one kid.   Not working for about 7 years now. Used to do plumbing work.   Social Determinants of Health   Financial Resource Strain: Not on file  Food Insecurity: No Food Insecurity (06/19/2022)   Hunger Vital Sign    Worried About Running Out of Food in the Last Year: Never true    Ran Out of Food in the Last Year: Never true  Transportation Needs: No Transportation Needs (06/19/2022)   PRAPARE - Hydrologist (Medical): No    Lack of Transportation (Non-Medical): No  Physical Activity: Not on file  Stress: Not on file  Social Connections: Not on file  Intimate Partner Violence: Not At Risk (06/19/2022)   Humiliation, Afraid, Rape, and Kick questionnaire    Fear of Current or Ex-Partner: No    Emotionally Abused: No  Physically Abused: No    Sexually Abused: No     Review of Systems  *** Gen: Denies any fever, chills, fatigue, weight loss, lack of appetite.  CV: Denies chest pain, heart palpitations,  peripheral edema, syncope.  Resp: Denies shortness of breath at rest or with exertion. Denies wheezing or cough.  GI: see HPI GU : Denies urinary burning, urinary frequency, urinary hesitancy MS: Denies joint pain, muscle weakness, cramps, or limitation of movement.  Derm: Denies rash, itching, dry skin Psych: Denies depression, anxiety, memory loss, and confusion Heme: Denies bruising, bleeding, and enlarged lymph nodes.   Physical Exam   There were no vitals taken for this visit.  General:   Alert and oriented. Pleasant and cooperative. Well-nourished and well-developed.  Head:  Normocephalic and atraumatic. Eyes:  Without icterus, sclera clear and conjunctiva pink.  Ears:  Normal auditory acuity. Mouth:  No deformity or lesions, oral mucosa pink.  Lungs:  Clear to auscultation bilaterally. No wheezes, rales, or rhonchi. No distress.  Heart:  S1, S2 present without murmurs appreciated.  Abdomen:  +BS, soft, non-tender and non-distended. No HSM noted. No guarding or rebound. No masses appreciated.  Rectal:  Deferred *** Msk:  Symmetrical without gross deformities. Normal posture. Extremities:  Without edema. Neurologic:  Alert and  oriented x4;  grossly normal neurologically. Skin:  Intact without significant lesions or rashes. Psych:  Alert and cooperative. Normal mood and affect.   Assessment   Eric Scott is a 72 y.o. male with a history of *** presenting today with      PLAN   *** CBC PPI *** Colonoscopy?    Venetia Night, MSN, FNP-BC, AGACNP-BC [facility]

## 2022-07-23 ENCOUNTER — Encounter: Payer: Self-pay | Admitting: Gastroenterology

## 2022-07-23 ENCOUNTER — Ambulatory Visit (INDEPENDENT_AMBULATORY_CARE_PROVIDER_SITE_OTHER): Payer: Medicare (Managed Care) | Admitting: Gastroenterology

## 2022-07-23 VITALS — BP 105/62 | HR 105 | Temp 97.6°F | Wt 254.0 lb

## 2022-07-23 DIAGNOSIS — K2990 Gastroduodenitis, unspecified, without bleeding: Secondary | ICD-10-CM

## 2022-07-23 DIAGNOSIS — K2970 Gastritis, unspecified, without bleeding: Secondary | ICD-10-CM

## 2022-07-23 DIAGNOSIS — R5383 Other fatigue: Secondary | ICD-10-CM

## 2022-07-23 DIAGNOSIS — K5900 Constipation, unspecified: Secondary | ICD-10-CM

## 2022-07-23 DIAGNOSIS — R1030 Lower abdominal pain, unspecified: Secondary | ICD-10-CM

## 2022-07-23 DIAGNOSIS — D649 Anemia, unspecified: Secondary | ICD-10-CM | POA: Diagnosis not present

## 2022-07-23 NOTE — Patient Instructions (Addendum)
We are increasing your pantoprazole to 40 mg twice daily.  Continue iron twice daily.  Colonoscopy offered but patient declined.  Continue miralax 17 g once daily Continue colace 100 mg nightly Benefiber 2 teaspoons daily (may mix with miralax in the mornings in 8 oz fluid.   Stop taking senna in combo with docusate sodium nightly.   We will recheck your hemoglobin. Please fax labs to out office once obtained.   Follow up in 2 months.   It was a pleasure to see you today. I want to create trusting relationships with patients. If you receive a survey regarding your visit,  I greatly appreciate you taking time to fill this out on paper or through your MyChart. I value your feedback.  Venetia Night, MSN, FNP-BC, AGACNP-BC [facility]

## 2022-07-25 NOTE — Addendum Note (Signed)
Addended by: Harvel Quale on: 07/25/2022 03:26 PM   Modules accepted: Level of Service

## 2022-07-26 ENCOUNTER — Ambulatory Visit: Payer: Medicare (Managed Care) | Admitting: Cardiovascular Disease

## 2022-07-30 ENCOUNTER — Ambulatory Visit: Payer: Medicare (Managed Care) | Admitting: Cardiovascular Disease

## 2022-07-30 ENCOUNTER — Telehealth: Payer: Self-pay | Admitting: Gastroenterology

## 2022-07-30 NOTE — Telephone Encounter (Signed)
Labs from facility reviewed.  Slightly hard to read labs sheet however appears hemoglobin has improved to 10 or 10.6 since discharge.  No further recommendations given this finding.  Continue oral iron twice daily.  Colonoscopy previously offered, patient declined.  If he decides he would like to proceed we can discuss scheduling at that time.  He is to continue Colace nightly, MiraLAX once daily, and add Benefiber daily.  These recommendations were given at his office visit.  Venetia Night, MSN, APRN, FNP-BC, AGACNP-BC [facility]

## 2022-07-31 NOTE — Telephone Encounter (Signed)
Spoke to the nurse Ebony Hail at [facility]. Informed her of results and recommendations. She voiced understanding. Informed to have patient continue iron twice daily and Colace nightly, MiraLax once daily and also Benefiber daily.

## 2022-08-06 ENCOUNTER — Encounter: Payer: Medicare (Managed Care) | Admitting: Orthopedic Surgery

## 2022-08-20 ENCOUNTER — Encounter: Payer: Medicare (Managed Care) | Admitting: Orthopedic Surgery

## 2022-08-27 ENCOUNTER — Ambulatory Visit (INDEPENDENT_AMBULATORY_CARE_PROVIDER_SITE_OTHER): Payer: Medicare (Managed Care) | Admitting: Orthopedic Surgery

## 2022-08-27 ENCOUNTER — Encounter: Payer: Self-pay | Admitting: Orthopedic Surgery

## 2022-08-27 DIAGNOSIS — T847XXA Infection and inflammatory reaction due to other internal orthopedic prosthetic devices, implants and grafts, initial encounter: Secondary | ICD-10-CM

## 2022-08-27 NOTE — Progress Notes (Signed)
Office Visit Note   Patient: Eric Scott           Date of Birth: February 23, 1951           MRN: [id number] Visit Date: 08/27/2022              Requested by: [name], [address] [place],  [place] [id number] PCP: [name], MD  Chief Complaint  Patient presents with   Left Ankle - Follow-up    03/28/2022 left ankle removal HWD with kerecis      HPI: Patient is a 72 year old gentleman who is 5 months status post removal of deep retained hardware left ankle application of Kerecis tissue graft.  Patient did have maceration secondary to drainage on the last exam.  Assessment & Plan: Visit Diagnoses:  1. Hardware complicating wound infection, initial encounter [facility])     Plan: Patient is showing interval improvement.  Will continue with Dial soap cleansing dry dressing changes continue elevation.  Follow-Up Instructions: Return in about 2 weeks (around 09/10/2022).   Ortho Exam  Patient is alert, oriented, no adenopathy, well-dressed, normal affect, normal respiratory effort. Examination patient has a new abrasion over the medial malleolus 1 cm diameter with healthy granulation tissue laterally the wound is almost completely healed.  There is 1 area distally that is 1 cm diameter and 1 area proximally that is 1 x 3 cm.  There is no maceration no cellulitis no drainage.  Imaging: No results found. No images are attached to the encounter.  Labs: Lab Results  Component Value Date   HGBA1C 5.8 (H) 03/28/2022   HGBA1C 6.5 (H) 05/12/2020   HGBA1C 7.0 (H) 11/16/2019   ESRSEDRATE 49 (H) 03/31/2022   ESRSEDRATE 60 (H) 11/26/2016   CRP 10.8 (H) 03/31/2022   CRP 6.5 (H) 04/20/2019   CRP 3.2 (H) 11/26/2016   LABURIC 11.5 (H) 08/24/2009   REPTSTATUS 06/20/2022 FINAL 06/18/2022   GRAMSTAIN NO WBC SEEN NO ORGANISMS SEEN  03/28/2022   CULT MULTIPLE SPECIES PRESENT, SUGGEST RECOLLECTION (A) 06/18/2022   LABORGA ACINETOBACTER CALCOACETICUS/BAUMANNII COMPLEX 03/28/2022   LABORGA  MORGANELLA MORGANII 03/28/2022   LABORGA ENTEROCOCCUS FAECALIS 03/28/2022   LABORGA METHICILLIN RESISTANT STAPHYLOCOCCUS AUREUS 03/28/2022     Lab Results  Component Value Date   ALBUMIN 2.1 (L) 07/02/2022   ALBUMIN 2.1 (L) 07/01/2022   ALBUMIN 2.1 (L) 06/30/2022    Lab Results  Component Value Date   MG 1.7 07/02/2022   MG 2.3 06/18/2022   MG 2.0 04/12/2022   No results found for: "VD25OH"  No results found for: "PREALBUMIN"    Latest Ref Rng & Units 07/02/2022    5:26 AM 06/30/2022    2:52 AM 06/29/2022    4:33 AM  CBC EXTENDED  WBC 4.0 - 10.5 K/uL 9.9  11.2  11.8   RBC 4.22 - 5.81 MIL/uL 3.32  3.15  3.01   Hemoglobin 13.0 - 17.0 g/dL 9.2  8.8  8.6   HCT 39.0 - 52.0 % 32.0  30.4  28.6   Platelets 150 - 400 K/uL 302  274  243      There is no height or weight on file to calculate BMI.  Orders:  No orders of the defined types were placed in this encounter.  No orders of the defined types were placed in this encounter.    Procedures: No procedures performed  Clinical Data: No additional findings.  ROS:  All other systems negative, except as noted in the HPI.  Review of Systems  Objective: Vital Signs: There were no vitals taken for this visit.  Specialty Comments:  No specialty comments available.  PMFS History: Patient Active Problem List   Diagnosis Date Noted   Melena [id number]   Acute metabolic encephalopathy [id number]   UTI (urinary tract infection) 06/18/2022   Status post peripherally inserted central catheter (PICC) central line placement 04/24/2022   PAF (paroxysmal atrial fibrillation) ([place]) 04/06/2022   Mood disorder ([place]) 04/02/2022   Stage 4 chronic kidney disease ([place])    Osteomyelitis of ankle or foot, acute, left ([place]) 03/29/2022   Ulcer of left lower leg, with necrosis of bone ([place]) 03/28/2022   Abscess of left lower leg [id number]   Hardware complicating wound infection ([place]) 03/28/2022   Leg wound, left, sequela 03/28/2022    Cholelithiasis 05/12/2020   Elevated liver enzymes    RUQ pain    Bacteremia 05/11/2020   COPD  GOLD ?  / AB  01/26/2020   Acute on chronic respiratory failure with hypoxia ([place]) 12/18/2019   Respiratory failure with hypoxia ([place]) 11/17/2019   Acute on chronic anemia 11/16/2019   Cellulitis of both lower extremities 11/16/2019   Leukocytosis 05/22/2019   Chronic heart failure with preserved ejection fraction (HFpEF) ([place]) 05/22/2019   Acute respiratory failure ([place]) 05/17/2019   Obesity, Class III, BMI 40-49.9 (morbid obesity) ([place]) 05/17/2019   Atrial flutter, paroxysmal ([place]) 05/17/2019   COPD with acute exacerbation ([place]) 04/20/2019   GERD (gastroesophageal reflux disease) 08/08/2018   PUD (peptic ulcer disease)    Gastric erosions 02/13/2017   Duodenal ulcer 02/13/2017   Transaminitis    UGI bleed    Arm DVT (deep venous thromboembolism), acute, right ([place]) 12/18/2016   Uremia 12/10/2016   Acute renal failure ([name])    Volume depletion 11/23/2016   Hypotension    Liver enzyme elevation    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 ([place]) 11/15/2016   Arterial insufficiency of lower extremity ([place]) 11/15/2016   Pressure injury of skin 11/09/2016   Sepsis ([place]) 11/09/2016   Osteomyelitis ([place]) 11/09/2016   Wounds, multiple 11/08/2016   Chronic venous insufficiency 10/12/2016   Critical lower limb ischemia ([place]) 10/12/2016   Fatty liver 09/10/2016   History of colonic polyps 09/10/2016   Gallstone 09/10/2016   AKI (acute kidney injury) ([place]) 03/04/2015   Fall 03/04/2015   Generalized weakness 03/04/2015   Diabetic polyneuropathy associated with type 2 diabetes mellitus ([place])    Anxiety    PVD (peripheral vascular disease) ([place])    Pneumonia 07/20/2014   Anxiety state 04/18/2014   Acute on chronic diastolic CHF (congestive heart failure) ([place]) 04/07/2014   Solitary pulmonary nodule 04/07/2014   COPD with exacerbation  ([place]) 04/02/2014   Hypoxia 04/02/2014   Cerebral infarction ([place]) 06/01/2012   Chronic pain (back, legs) 05/30/2012   Toe laceration, 4th toe 05/30/2012   C. 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 disease, with Selleck-term current use of insulin ([place]) 05/27/2012   Smoker 05/27/2012   HTN (hypertension) 05/27/2012   Past Medical History:  Diagnosis Date   Anemia    Anxiety    Chronic pain    legs, back; MRI 05/2012 with mild thoracic degenerative changes no spinal stenosis    CKD (chronic kidney disease) 05/12/2019   Stage IV   COPD (chronic obstructive pulmonary disease) ([place])  COVID-19    Depression    Diabetic peripheral neuropathy ([place])    "chronic" (A999333)   Diastolic CHF ([name])    Duodenal ulcer hemorrhage    DVT (deep venous thrombosis) ([name])    right upper arm   GERD (gastroesophageal reflux disease)    Hypercholesteremia    Hypertension    Iron deficiency anemia    Osteomyelitis of ankle ([name])    Peripheral edema    PVD (peripheral vascular disease) ([place])    Spinal stenosis    mild lumbar (MRI 05/2012)-L2-L3 to L4-L5 , mild lumbar foraminal stenosis    Stroke ([place]) 05/2012   Subacute, lacunar infarcts within the left basal ganglia and posterior limp of the left internal capsule/thalamus; "RUE; both feet weak" (05/27/2012)   Tremor    Type II diabetes mellitus ([place])     Family History  Problem Relation Age of Onset   Colon cancer Neg Hx     Past Surgical History:  Procedure Laterality Date   ANKLE SURGERY     APPLICATION OF WOUND VAC Left 03/28/2022   Procedure: APPLICATION OF WOUND VAC;  Surgeon: [name], MD;  Location: [facility];  Service: Orthopedics;  Laterality: Left;   ESOPHAGOGASTRODUODENOSCOPY (EGD) WITH PROPOFOL N/A 12/21/2016   Rourk: Ulcerative reflux esophagitis, erosive gastropathy, extensive duodenal ulceration likely site of  bleeding.  Pathology with mild gastritis, no H. pylori   ESOPHAGOGASTRODUODENOSCOPY (EGD) WITH PROPOFOL N/A 04/02/2017   Fields: ESOPAHGITIS/GASTRIC AND DUODENAL ULCERS HEALED. mild duodenitis   ESOPHAGOGASTRODUODENOSCOPY (EGD) WITH PROPOFOL N/A 06/21/2022   Procedure: ESOPHAGOGASTRODUODENOSCOPY (EGD) WITH PROPOFOL;  Surgeon: [name], DO;  [facility];  Service: Endoscopy;  Laterality: N/A;   HARDWARE REMOVAL Left 03/28/2022   Procedure: LEFT ANKLE REMOVAL DEEP HARDWARE, REMOVE BONE AND APPLY TISSUE GRAFT;  Surgeon: [name], MD;  Location: [facility];  Service: Orthopedics;  Laterality: Left;   HERNIA REPAIR  01/05/2004   "belly button" (05/27/2012)   IR FLUORO GUIDE CV LINE RIGHT  04/03/2022   IR REMOVAL TUN CV CATH W/O FL  05/10/2022   IR US GUIDE VASC ACCESS RIGHT  04/03/2022   Social History   Occupational History   Not on file  Tobacco Use   Smoking status: Every Day    Packs/day: 0.10    Years: 54.00    Additional pack years: 0.00    Total pack years: 5.40    Types: Cigarettes    Last attempt to quit: 12/12/2019    Years since quitting: 2.7   Smokeless tobacco: Never   Tobacco comments:    A pack a week  Vaping Use   Vaping Use: Never used  Substance and Sexual Activity   Alcohol use: No    Comment: 05/27/2012 "drank gallons and gallons 20 yr ago or so; last drink  at least 10 yr ago"   Drug use: No   Sexual activity: Not Currently

## 2022-09-23 NOTE — Progress Notes (Deleted)
GI Office Note    Referring Provider: Charlynne Pander, MD Primary Care Physician:  Charlynne Pander, MD Primary Gastroenterologist: Dolores Frame, MD   Date:  09/23/2022  ID:  Eric Scott, DOB June 18, 1950, MRN [id number]   Chief Complaint   No chief complaint on file.    History of Present Illness  Eric Scott is a 72 y.o. male with a history of *** presenting today with complaint of   Colonoscopy 10/29/12: -left colon diverticulosis -3 polyps in transverse colon and distal descending colon -prox transverse and distal descending colon revealed to be tubular adenoma   Last seen in the clinic in March 2020. Noted to have history of Cdiff and GI bleed in July 2018. At time of visit he was doing well. GERD well managed on PPI regimen. No changes to regimen.    Hospitalization December 2021 with concern for cholangitis and brief biliary obstruction secondary to choledocholithiasis.    Seen during hospitalization at [facility] in January 2024. Hospitalized for AMS and diminished oral intake and our service consulted for acute anemia and maroon colored stools. Underwent EGD as stated below. Patient was then prepped for colonoscopy however he had increase in oxygen requirements and recommended to be performed outpatient. Xarelto/Eliquis? was restarted. Hgb on discharge 9.2.    EGD 06/21/22: -Gastritis -Duodenitis -advised to continue PPI BID   Last office visit 07/23/22. ***  Today: Constipation -   Gastritis -   Anemia, fatigue -   Current Outpatient Medications  Medication Sig Dispense Refill   acetaminophen (TYLENOL) 325 MG tablet Take 2 tablets (650 mg total) by mouth every 6 (six) hours as needed for mild pain, fever or headache (or Fever >/= 101). 12 tablet 0   albuterol (PROVENTIL) (2.5 MG/3ML) 0.083% nebulizer solution Take 3 mLs (2.5 mg total) by nebulization every 6 (six) hours as needed for wheezing or shortness of breath. 75 mL 12   ALPRAZolam (XANAX) 0.5 MG tablet  Take 1 tablet (0.5 mg total) by mouth at bedtime. 10 tablet 0   Amino Acids-Protein Hydrolys (PRO-STAT AWC) LIQD Take 30 mLs by mouth daily.     amiodarone (PACERONE) 200 MG tablet Take 1 tablet (200 mg total) by mouth daily. 30 tablet 3   amLODipine (NORVASC) 10 MG tablet Take 1 tablet (10 mg total) by mouth daily. 30 tablet 2   apixaban (ELIQUIS) 5 MG TABS tablet Take 1 tablet (5 mg total) by mouth 2 (two) times daily. 60 tablet 2   ascorbic acid (VITAMIN C) 500 MG tablet Take 500 mg by mouth 2 (two) times daily.     BREO ELLIPTA 100-25 MCG/INH AEPB Inhale 1 puff into the lungs daily.      calcitRIOL (ROCALTROL) 0.25 MCG capsule Take 0.25 mcg by mouth daily.     calcium carbonate (TUMS - DOSED IN MG ELEMENTAL CALCIUM) 500 MG chewable tablet Chew 1 tablet by mouth daily.     cholecalciferol (VITAMIN D) 1000 units tablet Take 1,000 Units by mouth daily.     coconut oil OIL Apply 1 Application topically every 6 (six) hours as needed (protection and prevention).     colchicine 0.6 MG tablet Take 0.5 tablets (0.3 mg total) by mouth daily.     cyanocobalamin 100 MCG tablet Take 1,000 mcg by mouth daily.     ferrous sulfate 325 (65 FE) MG tablet Take 325 mg by mouth 2 (two) times daily.     FLUoxetine (PROZAC) 40 MG capsule Take 40 mg by mouth  daily.     gabapentin (NEURONTIN) 300 MG capsule Take 1 capsule (300 mg total) by mouth at bedtime. 30 capsule 1   guaiFENesin 200 MG tablet Take 200 mg by mouth 2 (two) times daily. (Patient not taking: Reported on 07/23/2022)     HUMALOG KWIKPEN 100 UNIT/ML KwikPen Inject 0-10 Units into the skin 3 (three) times daily before meals. Per sliding scale insulin aspart (novoLOG) injection 0-10 Units 0-10 Units Subcutaneous, 3 times daily with meals CBG < 70: Implement Hypoglycemia Standing Orders and refer to Hypoglycemia Standing Orders sidebar report  CBG 70 - 120: 0 unit CBG 121 - 150: 0 unit  CBG 151 - 200: 1 unit CBG 201 - 250: 2 units CBG 251 - 300: 4 units CBG  301 - 350: 6 units  CBG 351 - 400: 8 units  CBG > 400: 10 units 15 mL 11   HYDROcodone-acetaminophen (NORCO) 7.5-325 MG tablet Take 1 tablet by mouth 4 (four) times daily. 15 tablet 0   ipratropium-albuterol (DUONEB) 0.5-2.5 (3) MG/3ML SOLN Take 3 mLs by nebulization every 4 (four) hours as needed (shortness of breath / wheezing). 360 mL 1   LANTUS 100 UNIT/ML injection Inject 0.08 mLs (8 Units total) into the skin at bedtime. 10 mL 11   magnesium oxide (MAG-OX) 400 MG tablet Take 400 mg by mouth daily.     metoprolol tartrate (LOPRESSOR) 25 MG tablet Take 1 tablet (25 mg total) by mouth 2 (two) times daily.     Nutritional Supplement LIQD Take 120 mLs by mouth daily. "House supplement"     pantoprazole (PROTONIX) 40 MG tablet Take 1 tablet (40 mg total) by mouth daily before breakfast. 30 tablet 0   polyethylene glycol (MIRALAX / GLYCOLAX) 17 g packet Take 17 g by mouth daily. For constipation 30 each 2   senna-docusate (SENOKOT-S) 8.6-50 MG tablet Take 2 tablets by mouth at bedtime. 60 tablet 3   tamsulosin (FLOMAX) 0.4 MG CAPS capsule Take 1 capsule (0.4 mg total) by mouth daily after supper. 30 capsule 2   tiotropium (SPIRIVA HANDIHALER) 18 MCG inhalation capsule Place 1 capsule (18 mcg total) into inhaler and inhale daily. 30 capsule 2   torsemide (DEMADEX) 20 MG tablet Take 2 tablets (40 mg total) by mouth 2 (two) times daily. 120 tablet 3   No current facility-administered medications for this visit.    Past Medical History:  Diagnosis Date   Anemia    Anxiety    Chronic pain    legs, back; MRI 05/2012 with mild thoracic degenerative changes no spinal stenosis    CKD (chronic kidney disease) 05/12/2019   Stage IV   COPD (chronic obstructive pulmonary disease) ([name])    COVID-19    Depression    Diabetic peripheral neuropathy ([name])    "chronic" (05/27/2012)   Diastolic CHF ([name])    Duodenal ulcer hemorrhage    DVT (deep venous thrombosis) ([name])    right upper arm   GERD  (gastroesophageal reflux disease)    Hypercholesteremia    Hypertension    Iron deficiency anemia    Osteomyelitis of ankle ([name])    Peripheral edema    PVD (peripheral vascular disease) ([name])    Spinal stenosis    mild lumbar (MRI 05/2012)-L2-L3 to L4-L5 , mild lumbar foraminal stenosis    Stroke ([name]) 05/2012   Subacute, lacunar infarcts within the left basal ganglia and posterior limp of the left internal capsule/thalamus; "RUE; both feet weak" (05/27/2012)   Tremor  Type II diabetes mellitus [facility])     Past Surgical History:  Procedure Laterality Date   ANKLE SURGERY     APPLICATION OF WOUND VAC Left 03/28/2022   Procedure: APPLICATION OF WOUND VAC;  Surgeon: Nadara Mustard, MD;  [facility];  Service: Orthopedics;  Laterality: Left;   ESOPHAGOGASTRODUODENOSCOPY (EGD) WITH PROPOFOL N/A 12/21/2016   Rourk: Ulcerative reflux esophagitis, erosive gastropathy, extensive duodenal ulceration likely site of bleeding.  Pathology with mild gastritis, no H. pylori   ESOPHAGOGASTRODUODENOSCOPY (EGD) WITH PROPOFOL N/A 04/02/2017   Fields: ESOPAHGITIS/GASTRIC AND DUODENAL ULCERS HEALED. mild duodenitis   ESOPHAGOGASTRODUODENOSCOPY (EGD) WITH PROPOFOL N/A 06/21/2022   Procedure: ESOPHAGOGASTRODUODENOSCOPY (EGD) WITH PROPOFOL;  Surgeon: Lanelle Bal, DO;  [facility];  Service: Endoscopy;  Laterality: N/A;   HARDWARE REMOVAL Left 03/28/2022   Procedure: LEFT ANKLE REMOVAL DEEP HARDWARE, REMOVE BONE AND APPLY TISSUE GRAFT;  Surgeon: Nadara Mustard, MD;  [facility];  Service: Orthopedics;  Laterality: Left;   HERNIA REPAIR  01/05/2004   "belly button" (05/27/2012)   IR FLUORO GUIDE CV LINE RIGHT  04/03/2022   IR REMOVAL TUN CV CATH W/O FL  05/10/2022   IR US GUIDE VASC ACCESS RIGHT  04/03/2022    Family History  Problem Relation Age of Onset   Colon cancer Neg Hx     Allergies as of 09/24/2022 - Review Complete 08/27/2022  Allergen Reaction Noted   Baclofen   03/27/2022   Blueberry flavor Other (See Comments) 05/29/2016   Cucumber extract Other (See Comments) 08/01/2012   Flexeril [cyclobenzaprine hcl] Other (See Comments) 07/20/2010   Kiwi extract Other (See Comments) 08/01/2012    Social History   Socioeconomic History   Marital status: Legally Separated    Spouse name: Not on file   Number of children: Not on file   Years of education: Not on file   Highest education level: Not on file  Occupational History   Not on file  Tobacco Use   Smoking status: Every Day    Packs/day: 0.10    Years: 54.00    Additional pack years: 0.00    Total pack years: 5.40    Types: Cigarettes    Last attempt to quit: 12/12/2019    Years since quitting: 2.7   Smokeless tobacco: Never   Tobacco comments:    A pack a week  Vaping Use   Vaping Use: Never used  Substance and Sexual Activity   Alcohol use: No    Comment: 05/27/2012 "drank gallons and gallons 20 yr ago or so; last drink  at least 10 yr ago"   Drug use: No   Sexual activity: Not Currently  Other Topics Concern   Not on file  Social History Narrative   Pt [facility] for [facility] years now    He is divorced. Has one kid.   Not working for about 7 years now. Used to do plumbing work.   Social Determinants of Health   Financial Resource Strain: Not on file  Food Insecurity: No Food Insecurity (06/19/2022)   Hunger Vital Sign    Worried About Running Out of Food in the Last Year: Never true    Ran Out of Food in the Last Year: Never true  Transportation Needs: No Transportation Needs (06/19/2022)   PRAPARE - Administrator, Civil Service (Medical): No    Lack of Transportation (Non-Medical): No  Physical Activity: Not on file  Stress: Not  on file  Social Connections: Not on file     Review of Systems   Gen: Denies fever, chills, anorexia. Denies fatigue, weakness, weight loss.  CV: Denies chest pain, palpitations, syncope, peripheral edema, and  claudication. Resp: Denies dyspnea at rest, cough, wheezing, coughing up blood, and pleurisy. GI: See HPI Derm: Denies rash, itching, dry skin Psych: Denies depression, anxiety, memory loss, confusion. No homicidal or suicidal ideation.  Heme: Denies bruising, bleeding, and enlarged lymph nodes.   Physical Exam   There were no vitals taken for this visit.  General:   Alert and oriented. No distress noted. Pleasant and cooperative.  Head:  Normocephalic and atraumatic. Eyes:  Conjuctiva clear without scleral icterus. Mouth:  Oral mucosa pink and moist. Good dentition. No lesions. Lungs:  Clear to auscultation bilaterally. No wheezes, rales, or rhonchi. No distress.  Heart:  S1, S2 present without murmurs appreciated.  Abdomen:  +BS, soft, non-tender and non-distended. No rebound or guarding. No HSM or masses noted. Rectal: *** Msk:  Symmetrical without gross deformities. Normal posture. Extremities:  Without edema. Neurologic:  Alert and  oriented x4 Psych:  Alert and cooperative. Normal mood and affect.   Assessment  Nirvan Laban Madkins is a 72 y.o. male with a history of *** presenting today with   Anemia, fatigue:  Constipation, lower abdominal pain:  Gastritis:   PLAN   ***     Brooke Bonito, MSN, FNP-BC, AGACNP-BC [facility]

## 2022-09-24 ENCOUNTER — Ambulatory Visit: Payer: Medicare (Managed Care) | Admitting: Gastroenterology

## 2022-09-24 ENCOUNTER — Encounter: Payer: Medicare (Managed Care) | Admitting: Orthopedic Surgery

## 2022-11-03 DEATH — deceased

## 2022-11-05 ENCOUNTER — Ambulatory Visit: Payer: Medicare (Managed Care) | Admitting: Cardiovascular Disease

## 2024-04-08 ENCOUNTER — Encounter (INDEPENDENT_AMBULATORY_CARE_PROVIDER_SITE_OTHER): Payer: Self-pay | Admitting: Gastroenterology
